# Patient Record
Sex: Male | Born: 1945
Health system: Southern US, Community
[De-identification: ages and names within clinical notes are randomized; demographics above are authoritative.]

## PROBLEM LIST (undated history)

## (undated) DIAGNOSIS — D649 Anemia, unspecified: Secondary | ICD-10-CM

## (undated) DIAGNOSIS — M1990 Unspecified osteoarthritis, unspecified site: Secondary | ICD-10-CM

## (undated) DIAGNOSIS — L89159 Pressure ulcer of sacral region, unspecified stage: Secondary | ICD-10-CM

## (undated) DIAGNOSIS — G8220 Paraplegia, unspecified: Secondary | ICD-10-CM

## (undated) HISTORY — DX: Anemia, unspecified: D64.9

## (undated) HISTORY — DX: Unspecified osteoarthritis, unspecified site: M19.90

## (undated) HISTORY — DX: Paraplegia, unspecified: G82.20

## (undated) HISTORY — PX: TONSILLECTOMY: SUR1361

---

## 2004-10-03 ENCOUNTER — Emergency Department: Payer: Self-pay | Admitting: Emergency Medicine

## 2008-09-08 ENCOUNTER — Emergency Department: Payer: Self-pay | Admitting: Emergency Medicine

## 2008-09-08 ENCOUNTER — Inpatient Hospital Stay: Payer: Self-pay | Admitting: Internal Medicine

## 2008-11-03 ENCOUNTER — Emergency Department: Payer: Self-pay

## 2009-01-05 ENCOUNTER — Emergency Department: Payer: Self-pay | Admitting: Emergency Medicine

## 2009-09-20 ENCOUNTER — Emergency Department: Payer: Self-pay | Admitting: Emergency Medicine

## 2011-06-17 ENCOUNTER — Emergency Department: Payer: Self-pay | Admitting: Unknown Physician Specialty

## 2011-06-17 LAB — COMPREHENSIVE METABOLIC PANEL
Albumin: 3.6 g/dL (ref 3.4–5.0)
Alkaline Phosphatase: 74 U/L (ref 50–136)
Anion Gap: 7 (ref 7–16)
Calcium, Total: 8.7 mg/dL (ref 8.5–10.1)
Co2: 31 mmol/L (ref 21–32)
Creatinine: 0.61 mg/dL (ref 0.60–1.30)
EGFR (Non-African Amer.): >60
Glucose: 73 mg/dL (ref 65–99)
Osmolality: 283 (ref 275–301)
SGPT (ALT): 19 U/L
Sodium: 143 mmol/L (ref 136–145)

## 2011-06-17 LAB — CBC
HCT: 39.6 % — ABNORMAL LOW (ref 40.0–52.0)
MCH: 25.7 pg — ABNORMAL LOW (ref 26.0–34.0)
MCV: 79 fL — ABNORMAL LOW (ref 80–100)
RBC: 4.99 10*6/uL (ref 4.40–5.90)
RDW: 15.5 % — ABNORMAL HIGH (ref 11.5–14.5)
WBC: 7.6 10*3/uL (ref 3.8–10.6)

## 2011-06-17 LAB — MAGNESIUM: Magnesium: 2.3 mg/dL

## 2011-08-15 ENCOUNTER — Encounter: Payer: Self-pay | Admitting: Nurse Practitioner

## 2011-08-15 ENCOUNTER — Encounter: Payer: Self-pay | Admitting: Cardiothoracic Surgery

## 2011-09-05 ENCOUNTER — Encounter: Payer: Self-pay | Admitting: Cardiothoracic Surgery

## 2011-09-05 ENCOUNTER — Encounter: Payer: Self-pay | Admitting: Nurse Practitioner

## 2011-10-05 ENCOUNTER — Encounter: Payer: Self-pay | Admitting: Nurse Practitioner

## 2011-10-05 ENCOUNTER — Encounter: Payer: Self-pay | Admitting: Cardiothoracic Surgery

## 2011-11-05 ENCOUNTER — Encounter: Payer: Self-pay | Admitting: Cardiothoracic Surgery

## 2011-11-05 ENCOUNTER — Encounter: Payer: Self-pay | Admitting: Nurse Practitioner

## 2011-12-05 ENCOUNTER — Encounter: Payer: Self-pay | Admitting: Nurse Practitioner

## 2011-12-05 ENCOUNTER — Encounter: Payer: Self-pay | Admitting: Cardiothoracic Surgery

## 2012-01-05 ENCOUNTER — Encounter: Payer: Self-pay | Admitting: Cardiothoracic Surgery

## 2012-01-05 ENCOUNTER — Encounter: Payer: Self-pay | Admitting: Nurse Practitioner

## 2013-04-10 ENCOUNTER — Encounter: Payer: Self-pay | Admitting: Surgery

## 2013-05-06 ENCOUNTER — Encounter: Payer: Self-pay | Admitting: Surgery

## 2013-06-06 ENCOUNTER — Encounter: Payer: Self-pay | Admitting: Surgery

## 2013-06-24 ENCOUNTER — Emergency Department: Payer: Self-pay | Admitting: Emergency Medicine

## 2013-06-24 LAB — COMPREHENSIVE METABOLIC PANEL
ANION GAP: 4 — AB (ref 7–16)
AST: 29 U/L (ref 15–37)
Albumin: 3.3 g/dL — ABNORMAL LOW (ref 3.4–5.0)
Alkaline Phosphatase: 90 U/L
BUN: 21 mg/dL — AB (ref 7–18)
Bilirubin,Total: 0.3 mg/dL (ref 0.2–1.0)
Calcium, Total: 8.9 mg/dL (ref 8.5–10.1)
Chloride: 105 mmol/L (ref 98–107)
Co2: 27 mmol/L (ref 21–32)
Creatinine: 0.72 mg/dL (ref 0.60–1.30)
GLUCOSE: 92 mg/dL (ref 65–99)
Osmolality: 275 (ref 275–301)
Potassium: 4.9 mmol/L (ref 3.5–5.1)
SGPT (ALT): 16 U/L (ref 12–78)
Sodium: 136 mmol/L (ref 136–145)
Total Protein: 7.3 g/dL (ref 6.4–8.2)

## 2013-06-24 LAB — CBC WITH DIFFERENTIAL/PLATELET
BASOS ABS: 0.1 10*3/uL (ref 0.0–0.1)
BASOS PCT: 1.1 %
EOS ABS: 0.3 10*3/uL (ref 0.0–0.7)
Eosinophil %: 3.4 %
HCT: 36.9 % — AB (ref 40.0–52.0)
HGB: 11.8 g/dL — AB (ref 13.0–18.0)
Lymphocyte #: 2.8 10*3/uL (ref 1.0–3.6)
Lymphocyte %: 31.2 %
MCH: 24 pg — ABNORMAL LOW (ref 26.0–34.0)
MCHC: 32 g/dL (ref 32.0–36.0)
MCV: 75 fL — ABNORMAL LOW (ref 80–100)
Monocyte #: 0.8 x10 3/mm (ref 0.2–1.0)
Monocyte %: 8.3 %
Neutrophil #: 5.1 10*3/uL (ref 1.4–6.5)
Neutrophil %: 56 %
Platelet: 273 10*3/uL (ref 150–440)
RBC: 4.92 10*6/uL (ref 4.40–5.90)
RDW: 16 % — AB (ref 11.5–14.5)
WBC: 9.1 10*3/uL (ref 3.8–10.6)

## 2013-06-26 LAB — CBC WITH DIFFERENTIAL/PLATELET
Basophil #: 0 10*3/uL (ref 0.0–0.1)
Basophil %: 0.3 %
EOS ABS: 0.5 10*3/uL (ref 0.0–0.7)
Eosinophil %: 5.8 %
HCT: 30.9 % — ABNORMAL LOW (ref 40.0–52.0)
HGB: 9.8 g/dL — AB (ref 13.0–18.0)
Lymphocyte #: 2.2 10*3/uL (ref 1.0–3.6)
Lymphocyte %: 27 %
MCH: 23.7 pg — ABNORMAL LOW (ref 26.0–34.0)
MCHC: 31.8 g/dL — AB (ref 32.0–36.0)
MCV: 75 fL — ABNORMAL LOW (ref 80–100)
MONOS PCT: 8.1 %
Monocyte #: 0.7 x10 3/mm (ref 0.2–1.0)
NEUTROS ABS: 4.9 10*3/uL (ref 1.4–6.5)
Neutrophil %: 58.8 %
Platelet: 247 10*3/uL (ref 150–440)
RBC: 4.14 10*6/uL — AB (ref 4.40–5.90)
RDW: 15.9 % — AB (ref 11.5–14.5)
WBC: 8.3 10*3/uL (ref 3.8–10.6)

## 2013-06-27 ENCOUNTER — Inpatient Hospital Stay: Payer: Self-pay | Admitting: Internal Medicine

## 2013-06-27 LAB — HEMOGLOBIN
HGB: 10.1 g/dL — ABNORMAL LOW (ref 13.0–18.0)
HGB: 9.2 g/dL — AB (ref 13.0–18.0)
HGB: 9 g/dL — ABNORMAL LOW (ref 13.0–18.0)

## 2013-06-27 LAB — HEMATOCRIT
HCT: 31.6 % — ABNORMAL LOW (ref 40.0–52.0)
HCT: 29.1 % — ABNORMAL LOW (ref 40.0–52.0)
HCT: 28.2 % — AB (ref 40.0–52.0)

## 2013-06-27 LAB — COMPREHENSIVE METABOLIC PANEL
ALBUMIN: 3.2 g/dL — AB (ref 3.4–5.0)
Alkaline Phosphatase: 76 U/L
Anion Gap: 7 (ref 7–16)
BUN: 14 mg/dL (ref 7–18)
Bilirubin,Total: 0.3 mg/dL (ref 0.2–1.0)
CREATININE: 0.81 mg/dL (ref 0.60–1.30)
Calcium, Total: 8.4 mg/dL — ABNORMAL LOW (ref 8.5–10.1)
Chloride: 102 mmol/L (ref 98–107)
Co2: 30 mmol/L (ref 21–32)
EGFR (Non-African Amer.): >60
Glucose: 147 mg/dL — ABNORMAL HIGH (ref 65–99)
OSMOLALITY: 281 (ref 275–301)
POTASSIUM: 3.5 mmol/L (ref 3.5–5.1)
SGOT(AST): 13 U/L — ABNORMAL LOW (ref 15–37)
SGPT (ALT): 15 U/L (ref 12–78)
Sodium: 139 mmol/L (ref 136–145)
Total Protein: 6.9 g/dL (ref 6.4–8.2)

## 2013-06-27 LAB — TROPONIN I: Troponin-I: <0.02 ng/mL

## 2013-06-28 LAB — CBC WITH DIFFERENTIAL/PLATELET
Basophil #: 0 10*3/uL (ref 0.0–0.1)
Basophil %: 0.3 %
Eosinophil #: 0.4 10*3/uL (ref 0.0–0.7)
Eosinophil %: 4.8 %
HCT: 29.2 % — AB (ref 40.0–52.0)
HGB: 9.3 g/dL — ABNORMAL LOW (ref 13.0–18.0)
Lymphocyte #: 1.6 10*3/uL (ref 1.0–3.6)
Lymphocyte %: 19.6 %
MCH: 24.1 pg — ABNORMAL LOW (ref 26.0–34.0)
MCHC: 31.9 g/dL — ABNORMAL LOW (ref 32.0–36.0)
MCV: 75 fL — ABNORMAL LOW (ref 80–100)
MONO ABS: 0.7 x10 3/mm (ref 0.2–1.0)
Monocyte %: 8.9 %
NEUTROS ABS: 5.3 10*3/uL (ref 1.4–6.5)
NEUTROS PCT: 66.4 %
PLATELETS: 255 10*3/uL (ref 150–440)
RBC: 3.88 10*6/uL — ABNORMAL LOW (ref 4.40–5.90)
RDW: 16.3 % — ABNORMAL HIGH (ref 11.5–14.5)
WBC: 8 10*3/uL (ref 3.8–10.6)

## 2013-06-28 LAB — BASIC METABOLIC PANEL
Anion Gap: 2 — ABNORMAL LOW (ref 7–16)
BUN: 6 mg/dL — ABNORMAL LOW (ref 7–18)
CHLORIDE: 109 mmol/L — AB (ref 98–107)
CO2: 32 mmol/L (ref 21–32)
CREATININE: 0.72 mg/dL (ref 0.60–1.30)
Calcium, Total: 8.2 mg/dL — ABNORMAL LOW (ref 8.5–10.1)
EGFR (African American): >60
EGFR (Non-African Amer.): >60
GLUCOSE: 101 mg/dL — AB (ref 65–99)
Osmolality: 283 (ref 275–301)
Potassium: 3.8 mmol/L (ref 3.5–5.1)
SODIUM: 143 mmol/L (ref 136–145)

## 2013-07-01 ENCOUNTER — Ambulatory Visit: Payer: Self-pay | Admitting: Gastroenterology

## 2013-07-07 ENCOUNTER — Encounter: Payer: Self-pay | Admitting: Surgery

## 2013-08-09 ENCOUNTER — Encounter: Payer: Self-pay | Admitting: Surgery

## 2013-08-20 ENCOUNTER — Ambulatory Visit: Payer: Self-pay | Admitting: Surgery

## 2013-08-20 LAB — BASIC METABOLIC PANEL
Anion Gap: 0 — ABNORMAL LOW (ref 7–16)
BUN: 12 mg/dL (ref 7–18)
Calcium, Total: 9.2 mg/dL (ref 8.5–10.1)
Chloride: 108 mmol/L — ABNORMAL HIGH (ref 98–107)
Co2: 31 mmol/L (ref 21–32)
Creatinine: 0.77 mg/dL (ref 0.60–1.30)
EGFR (African American): >60
Glucose: 90 mg/dL (ref 65–99)
OSMOLALITY: 277 (ref 275–301)
Potassium: 4.4 mmol/L (ref 3.5–5.1)
Sodium: 139 mmol/L (ref 136–145)

## 2013-08-20 LAB — CBC
HCT: 36.8 % — ABNORMAL LOW (ref 40.0–52.0)
HGB: 11.6 g/dL — ABNORMAL LOW (ref 13.0–18.0)
MCH: 23 pg — ABNORMAL LOW (ref 26.0–34.0)
MCHC: 31.6 g/dL — ABNORMAL LOW (ref 32.0–36.0)
MCV: 73 fL — ABNORMAL LOW (ref 80–100)
PLATELETS: 273 10*3/uL (ref 150–440)
RBC: 5.04 10*6/uL (ref 4.40–5.90)
RDW: 16.4 % — AB (ref 11.5–14.5)
WBC: 6.6 10*3/uL (ref 3.8–10.6)

## 2013-08-30 ENCOUNTER — Ambulatory Visit: Payer: Self-pay | Admitting: Surgery

## 2013-09-02 LAB — PATHOLOGY REPORT

## 2013-09-04 ENCOUNTER — Encounter: Payer: Self-pay | Admitting: Surgery

## 2014-04-19 ENCOUNTER — Inpatient Hospital Stay: Payer: Self-pay | Admitting: Family Medicine

## 2014-04-19 LAB — URINALYSIS, COMPLETE
BILIRUBIN, UR: NEGATIVE
Bacteria: NONE SEEN
GLUCOSE, UR: NEGATIVE mg/dL (ref 0–75)
Nitrite: NEGATIVE
Ph: 5 (ref 4.5–8.0)
Protein: NEGATIVE
RBC,UR: 3 /HPF (ref 0–5)
Specific Gravity: 1.018 (ref 1.003–1.030)
Squamous Epithelial: 4
WBC UR: 35 /HPF (ref 0–5)

## 2014-04-19 LAB — COMPREHENSIVE METABOLIC PANEL
ALK PHOS: 64 U/L
ALT: 18 U/L
AST: 26 U/L (ref 15–37)
Albumin: 2.5 g/dL — ABNORMAL LOW (ref 3.4–5.0)
Anion Gap: 8 (ref 7–16)
BUN: 12 mg/dL (ref 7–18)
Bilirubin,Total: 0.4 mg/dL (ref 0.2–1.0)
CHLORIDE: 103 mmol/L (ref 98–107)
CO2: 27 mmol/L (ref 21–32)
CREATININE: 0.83 mg/dL (ref 0.60–1.30)
Calcium, Total: 7.9 mg/dL — ABNORMAL LOW (ref 8.5–10.1)
GLUCOSE: 81 mg/dL (ref 65–99)
OSMOLALITY: 274 (ref 275–301)
Potassium: 4 mmol/L (ref 3.5–5.1)
Sodium: 138 mmol/L (ref 136–145)
TOTAL PROTEIN: 7.5 g/dL (ref 6.4–8.2)

## 2014-04-19 LAB — CBC WITH DIFFERENTIAL/PLATELET
BASOS PCT: 0.3 %
Basophil #: 0 10*3/uL (ref 0.0–0.1)
EOS PCT: 0.2 %
Eosinophil #: 0 10*3/uL (ref 0.0–0.7)
HCT: 31.3 % — ABNORMAL LOW (ref 40.0–52.0)
HGB: 9.6 g/dL — ABNORMAL LOW (ref 13.0–18.0)
Lymphocyte #: 1.1 10*3/uL (ref 1.0–3.6)
Lymphocyte %: 9.5 %
MCH: 21.7 pg — AB (ref 26.0–34.0)
MCHC: 30.6 g/dL — ABNORMAL LOW (ref 32.0–36.0)
MCV: 71 fL — AB (ref 80–100)
MONO ABS: 0.9 x10 3/mm (ref 0.2–1.0)
MONOS PCT: 8.4 %
NEUTROS ABS: 9.1 10*3/uL — AB (ref 1.4–6.5)
Neutrophil %: 81.6 %
PLATELETS: 547 10*3/uL — AB (ref 150–440)
RBC: 4.42 10*6/uL (ref 4.40–5.90)
RDW: 15.5 % — ABNORMAL HIGH (ref 11.5–14.5)
WBC: 11.2 10*3/uL — AB (ref 3.8–10.6)

## 2014-04-20 LAB — CBC WITH DIFFERENTIAL/PLATELET
BASOS ABS: 0 10*3/uL (ref 0.0–0.1)
Basophil %: 0.3 %
EOS PCT: 1.1 %
Eosinophil #: 0.1 10*3/uL (ref 0.0–0.7)
HCT: 26.2 % — ABNORMAL LOW (ref 40.0–52.0)
HGB: 8.2 g/dL — ABNORMAL LOW (ref 13.0–18.0)
Lymphocyte #: 2.1 10*3/uL (ref 1.0–3.6)
Lymphocyte %: 22.5 %
MCH: 22 pg — AB (ref 26.0–34.0)
MCHC: 31.5 g/dL — ABNORMAL LOW (ref 32.0–36.0)
MCV: 70 fL — AB (ref 80–100)
Monocyte #: 1.2 x10 3/mm — ABNORMAL HIGH (ref 0.2–1.0)
Monocyte %: 12.6 %
NEUTROS PCT: 63.5 %
Neutrophil #: 5.9 10*3/uL (ref 1.4–6.5)
Platelet: 451 10*3/uL — ABNORMAL HIGH (ref 150–440)
RBC: 3.74 10*6/uL — ABNORMAL LOW (ref 4.40–5.90)
RDW: 15.8 % — AB (ref 11.5–14.5)
WBC: 9.3 10*3/uL (ref 3.8–10.6)

## 2014-04-20 LAB — BASIC METABOLIC PANEL
Anion Gap: 5 — ABNORMAL LOW (ref 7–16)
BUN: 10 mg/dL (ref 7–18)
Calcium, Total: 7.7 mg/dL — ABNORMAL LOW (ref 8.5–10.1)
Chloride: 111 mmol/L — ABNORMAL HIGH (ref 98–107)
Co2: 28 mmol/L (ref 21–32)
Creatinine: 0.76 mg/dL (ref 0.60–1.30)
EGFR (African American): >60
EGFR (Non-African Amer.): >60
Glucose: 112 mg/dL — ABNORMAL HIGH (ref 65–99)
Osmolality: 287 (ref 275–301)
Potassium: 3.5 mmol/L (ref 3.5–5.1)
Sodium: 144 mmol/L (ref 136–145)

## 2014-04-21 LAB — CBC WITH DIFFERENTIAL/PLATELET
BASOS PCT: 0.5 %
Basophil #: 0 10*3/uL (ref 0.0–0.1)
EOS ABS: 0.2 10*3/uL (ref 0.0–0.7)
Eosinophil %: 2.8 %
HCT: 27.2 % — ABNORMAL LOW (ref 40.0–52.0)
HGB: 8.7 g/dL — ABNORMAL LOW (ref 13.0–18.0)
LYMPHS ABS: 1.5 10*3/uL (ref 1.0–3.6)
Lymphocyte %: 23 %
MCH: 22.2 pg — ABNORMAL LOW (ref 26.0–34.0)
MCHC: 32 g/dL (ref 32.0–36.0)
MCV: 70 fL — AB (ref 80–100)
Monocyte #: 0.4 x10 3/mm (ref 0.2–1.0)
Monocyte %: 6.1 %
NEUTROS ABS: 4.5 10*3/uL (ref 1.4–6.5)
NEUTROS PCT: 67.6 %
PLATELETS: 429 10*3/uL (ref 150–440)
RBC: 3.91 10*6/uL — AB (ref 4.40–5.90)
RDW: 15.9 % — AB (ref 11.5–14.5)
WBC: 6.7 10*3/uL (ref 3.8–10.6)

## 2014-04-21 LAB — BASIC METABOLIC PANEL
ANION GAP: 6 — AB (ref 7–16)
BUN: 8 mg/dL (ref 7–18)
Calcium, Total: 7.6 mg/dL — ABNORMAL LOW (ref 8.5–10.1)
Chloride: 110 mmol/L — ABNORMAL HIGH (ref 98–107)
Co2: 28 mmol/L (ref 21–32)
Creatinine: 0.75 mg/dL (ref 0.60–1.30)
EGFR (African American): >60
GLUCOSE: 88 mg/dL (ref 65–99)
Osmolality: 285 (ref 275–301)
Potassium: 3.4 mmol/L — ABNORMAL LOW (ref 3.5–5.1)
SODIUM: 144 mmol/L (ref 136–145)

## 2014-04-21 LAB — VANCOMYCIN, TROUGH
Vancomycin, Trough: 16 ug/mL (ref 10–20)
Vancomycin, Trough: 19 ug/mL (ref 10–20)

## 2014-04-22 LAB — BASIC METABOLIC PANEL
ANION GAP: 6 — AB (ref 7–16)
BUN: 7 mg/dL (ref 7–18)
CHLORIDE: 109 mmol/L — AB (ref 98–107)
Calcium, Total: 7.3 mg/dL — ABNORMAL LOW (ref 8.5–10.1)
Co2: 29 mmol/L (ref 21–32)
Creatinine: 0.85 mg/dL (ref 0.60–1.30)
EGFR (African American): >60
GLUCOSE: 85 mg/dL (ref 65–99)
OSMOLALITY: 284 (ref 275–301)
Potassium: 3.5 mmol/L (ref 3.5–5.1)
Sodium: 144 mmol/L (ref 136–145)

## 2014-04-22 LAB — CBC WITH DIFFERENTIAL/PLATELET
BASOS PCT: 0.5 %
Basophil #: 0 10*3/uL (ref 0.0–0.1)
EOS ABS: 0.3 10*3/uL (ref 0.0–0.7)
EOS PCT: 4.9 %
HCT: 28.6 % — AB (ref 40.0–52.0)
HGB: 9.1 g/dL — ABNORMAL LOW (ref 13.0–18.0)
LYMPHS ABS: 1.3 10*3/uL (ref 1.0–3.6)
LYMPHS PCT: 23.5 %
MCH: 22 pg — ABNORMAL LOW (ref 26.0–34.0)
MCHC: 31.7 g/dL — AB (ref 32.0–36.0)
MCV: 69 fL — AB (ref 80–100)
MONO ABS: 0.4 x10 3/mm (ref 0.2–1.0)
MONOS PCT: 7.8 %
NEUTROS ABS: 3.4 10*3/uL (ref 1.4–6.5)
Neutrophil %: 63.3 %
Platelet: 445 10*3/uL — ABNORMAL HIGH (ref 150–440)
RBC: 4.12 10*6/uL — ABNORMAL LOW (ref 4.40–5.90)
RDW: 15.8 % — ABNORMAL HIGH (ref 11.5–14.5)
WBC: 5.4 10*3/uL (ref 3.8–10.6)

## 2014-04-22 LAB — URINE CULTURE

## 2014-04-22 LAB — VANCOMYCIN, TROUGH: Vancomycin, Trough: 18 ug/mL (ref 10–20)

## 2014-04-23 LAB — CBC WITH DIFFERENTIAL/PLATELET
BASOS ABS: 0 10*3/uL (ref 0.0–0.1)
Basophil %: 0.4 %
Eosinophil #: 0.3 10*3/uL (ref 0.0–0.7)
Eosinophil %: 6.2 %
HCT: 29.6 % — ABNORMAL LOW (ref 40.0–52.0)
HGB: 9.4 g/dL — ABNORMAL LOW (ref 13.0–18.0)
LYMPHS ABS: 1.3 10*3/uL (ref 1.0–3.6)
Lymphocyte %: 27 %
MCH: 22.1 pg — AB (ref 26.0–34.0)
MCHC: 31.6 g/dL — ABNORMAL LOW (ref 32.0–36.0)
MCV: 70 fL — AB (ref 80–100)
MONO ABS: 0.5 x10 3/mm (ref 0.2–1.0)
Monocyte %: 11.1 %
NEUTROS PCT: 55.3 %
Neutrophil #: 2.7 10*3/uL (ref 1.4–6.5)
Platelet: 421 10*3/uL (ref 150–440)
RBC: 4.24 10*6/uL — ABNORMAL LOW (ref 4.40–5.90)
RDW: 15.7 % — AB (ref 11.5–14.5)
WBC: 4.9 10*3/uL (ref 3.8–10.6)

## 2014-04-23 LAB — BASIC METABOLIC PANEL
Anion Gap: 6 — ABNORMAL LOW (ref 7–16)
BUN: 7 mg/dL (ref 7–18)
CALCIUM: 8.1 mg/dL — AB (ref 8.5–10.1)
CHLORIDE: 109 mmol/L — AB (ref 98–107)
CREATININE: 0.82 mg/dL (ref 0.60–1.30)
Co2: 27 mmol/L (ref 21–32)
EGFR (African American): >60
EGFR (Non-African Amer.): >60
GLUCOSE: 98 mg/dL (ref 65–99)
OSMOLALITY: 281 (ref 275–301)
POTASSIUM: 3.3 mmol/L — AB (ref 3.5–5.1)
SODIUM: 142 mmol/L (ref 136–145)

## 2014-04-24 LAB — WOUND CULTURE

## 2014-04-24 LAB — CULTURE, BLOOD (SINGLE)

## 2014-05-13 ENCOUNTER — Observation Stay: Payer: Self-pay | Admitting: Family Medicine

## 2014-05-13 LAB — CBC WITH DIFFERENTIAL/PLATELET
Basophil #: 0 10*3/uL (ref 0.0–0.1)
Basophil %: 0.3 %
Eosinophil #: 0.1 10*3/uL (ref 0.0–0.7)
Eosinophil %: 0.6 %
HCT: 30.4 % — ABNORMAL LOW (ref 40.0–52.0)
HGB: 9.3 g/dL — ABNORMAL LOW (ref 13.0–18.0)
Lymphocyte #: 1.9 10*3/uL (ref 1.0–3.6)
Lymphocyte %: 15.7 %
MCH: 21.4 pg — ABNORMAL LOW (ref 26.0–34.0)
MCHC: 30.7 g/dL — ABNORMAL LOW (ref 32.0–36.0)
MCV: 70 fL — ABNORMAL LOW (ref 80–100)
MONO ABS: 1.5 x10 3/mm — AB (ref 0.2–1.0)
Monocyte %: 12.1 %
NEUTROS PCT: 71.3 %
Neutrophil #: 8.7 10*3/uL — ABNORMAL HIGH (ref 1.4–6.5)
Platelet: 520 10*3/uL — ABNORMAL HIGH (ref 150–440)
RBC: 4.35 10*6/uL — ABNORMAL LOW (ref 4.40–5.90)
RDW: 16.8 % — ABNORMAL HIGH (ref 11.5–14.5)
WBC: 12.2 10*3/uL — AB (ref 3.8–10.6)

## 2014-05-13 LAB — COMPREHENSIVE METABOLIC PANEL
ALK PHOS: 71 U/L
ANION GAP: 8 (ref 7–16)
AST: 18 U/L (ref 15–37)
Albumin: 2.5 g/dL — ABNORMAL LOW (ref 3.4–5.0)
BUN: 23 mg/dL — ABNORMAL HIGH (ref 7–18)
Bilirubin,Total: 0.5 mg/dL (ref 0.2–1.0)
Calcium, Total: 8.7 mg/dL (ref 8.5–10.1)
Chloride: 99 mmol/L (ref 98–107)
Co2: 28 mmol/L (ref 21–32)
Creatinine: 1.05 mg/dL (ref 0.60–1.30)
EGFR (Non-African Amer.): >60
Glucose: 92 mg/dL (ref 65–99)
Osmolality: 273 (ref 275–301)
POTASSIUM: 3.8 mmol/L (ref 3.5–5.1)
SGPT (ALT): 13 U/L — ABNORMAL LOW
Sodium: 135 mmol/L — ABNORMAL LOW (ref 136–145)
TOTAL PROTEIN: 7.8 g/dL (ref 6.4–8.2)

## 2014-05-13 LAB — URINALYSIS, COMPLETE
BILIRUBIN, UR: NEGATIVE
Blood: NEGATIVE
GLUCOSE, UR: NEGATIVE mg/dL (ref 0–75)
Ketone: NEGATIVE
LEUKOCYTE ESTERASE: NEGATIVE
Nitrite: NEGATIVE
Ph: 5 (ref 4.5–8.0)
Protein: NEGATIVE
SPECIFIC GRAVITY: 1.011 (ref 1.003–1.030)
WBC UR: 4 /HPF (ref 0–5)

## 2014-05-13 LAB — PROTIME-INR
INR: 1.1
PROTHROMBIN TIME: 14.3 s (ref 11.5–14.7)

## 2014-05-13 LAB — TROPONIN I: Troponin-I: <0.02 ng/mL

## 2014-05-13 LAB — D-DIMER(ARMC): D-Dimer: 1136 ng/ml

## 2014-05-14 LAB — CBC WITH DIFFERENTIAL/PLATELET
Basophil #: 0 10*3/uL (ref 0.0–0.1)
Basophil %: 0.2 %
Eosinophil #: 0.1 10*3/uL (ref 0.0–0.7)
Eosinophil %: 0.9 %
HCT: 26.2 % — ABNORMAL LOW (ref 40.0–52.0)
HGB: 8.2 g/dL — AB (ref 13.0–18.0)
Lymphocyte #: 1.6 10*3/uL (ref 1.0–3.6)
Lymphocyte %: 16.3 %
MCH: 21.8 pg — AB (ref 26.0–34.0)
MCHC: 31.2 g/dL — AB (ref 32.0–36.0)
MCV: 70 fL — ABNORMAL LOW (ref 80–100)
MONOS PCT: 12.7 %
Monocyte #: 1.3 x10 3/mm — ABNORMAL HIGH (ref 0.2–1.0)
NEUTROS ABS: 7 10*3/uL — AB (ref 1.4–6.5)
Neutrophil %: 69.9 %
Platelet: 416 10*3/uL (ref 150–440)
RBC: 3.74 10*6/uL — ABNORMAL LOW (ref 4.40–5.90)
RDW: 16.9 % — ABNORMAL HIGH (ref 11.5–14.5)
WBC: 10 10*3/uL (ref 3.8–10.6)

## 2014-05-14 LAB — BASIC METABOLIC PANEL
Anion Gap: 9 (ref 7–16)
BUN: 20 mg/dL — AB (ref 7–18)
CHLORIDE: 103 mmol/L (ref 98–107)
CO2: 27 mmol/L (ref 21–32)
Calcium, Total: 8 mg/dL — ABNORMAL LOW (ref 8.5–10.1)
Creatinine: 1.1 mg/dL (ref 0.60–1.30)
Glucose: 92 mg/dL (ref 65–99)
OSMOLALITY: 280 (ref 275–301)
Potassium: 3.7 mmol/L (ref 3.5–5.1)
SODIUM: 139 mmol/L (ref 136–145)

## 2014-05-14 LAB — SEDIMENTATION RATE: Erythrocyte Sed Rate: 85 mm/hr — ABNORMAL HIGH (ref 0–20)

## 2014-05-15 LAB — CBC WITH DIFFERENTIAL/PLATELET
Basophil #: 0 10*3/uL (ref 0.0–0.1)
Basophil %: 0.3 %
Eosinophil #: 0.3 10*3/uL (ref 0.0–0.7)
Eosinophil %: 3.8 %
HCT: 25.1 % — ABNORMAL LOW (ref 40.0–52.0)
HGB: 7.9 g/dL — ABNORMAL LOW (ref 13.0–18.0)
LYMPHS PCT: 24.4 %
Lymphocyte #: 2 10*3/uL (ref 1.0–3.6)
MCH: 21.7 pg — ABNORMAL LOW (ref 26.0–34.0)
MCHC: 31.4 g/dL — ABNORMAL LOW (ref 32.0–36.0)
MCV: 69 fL — ABNORMAL LOW (ref 80–100)
MONOS PCT: 10.4 %
Monocyte #: 0.9 x10 3/mm (ref 0.2–1.0)
NEUTROS PCT: 61.1 %
Neutrophil #: 5.1 10*3/uL (ref 1.4–6.5)
PLATELETS: 387 10*3/uL (ref 150–440)
RBC: 3.64 10*6/uL — AB (ref 4.40–5.90)
RDW: 16.8 % — ABNORMAL HIGH (ref 11.5–14.5)
WBC: 8.3 10*3/uL (ref 3.8–10.6)

## 2014-05-15 LAB — BASIC METABOLIC PANEL
ANION GAP: 7 (ref 7–16)
BUN: 11 mg/dL (ref 7–18)
CO2: 26 mmol/L (ref 21–32)
Calcium, Total: 7.7 mg/dL — ABNORMAL LOW (ref 8.5–10.1)
Chloride: 109 mmol/L — ABNORMAL HIGH (ref 98–107)
Creatinine: 0.89 mg/dL (ref 0.60–1.30)
EGFR (Non-African Amer.): >60
GLUCOSE: 86 mg/dL (ref 65–99)
OSMOLALITY: 282 (ref 275–301)
Potassium: 3.1 mmol/L — ABNORMAL LOW (ref 3.5–5.1)
SODIUM: 142 mmol/L (ref 136–145)

## 2014-05-15 LAB — VANCOMYCIN, TROUGH: VANCOMYCIN, TROUGH: 16 ug/mL (ref 10–20)

## 2014-05-16 LAB — CBC WITH DIFFERENTIAL/PLATELET
BASOS PCT: 0.3 %
Basophil #: 0 10*3/uL (ref 0.0–0.1)
EOS ABS: 0.3 10*3/uL (ref 0.0–0.7)
Eosinophil %: 3.9 %
HCT: 26.4 % — ABNORMAL LOW (ref 40.0–52.0)
HGB: 8.2 g/dL — ABNORMAL LOW (ref 13.0–18.0)
LYMPHS ABS: 1.8 10*3/uL (ref 1.0–3.6)
Lymphocyte %: 25.7 %
MCH: 21.6 pg — ABNORMAL LOW (ref 26.0–34.0)
MCHC: 31 g/dL — AB (ref 32.0–36.0)
MCV: 70 fL — AB (ref 80–100)
Monocyte #: 0.5 x10 3/mm (ref 0.2–1.0)
Monocyte %: 7.5 %
Neutrophil #: 4.3 10*3/uL (ref 1.4–6.5)
Neutrophil %: 62.6 %
Platelet: 424 10*3/uL (ref 150–440)
RBC: 3.79 10*6/uL — AB (ref 4.40–5.90)
RDW: 16.9 % — ABNORMAL HIGH (ref 11.5–14.5)
WBC: 6.9 10*3/uL (ref 3.8–10.6)

## 2014-05-16 LAB — BASIC METABOLIC PANEL
ANION GAP: 9 (ref 7–16)
BUN: 8 mg/dL (ref 7–18)
CALCIUM: 7.9 mg/dL — AB (ref 8.5–10.1)
Chloride: 106 mmol/L (ref 98–107)
Co2: 27 mmol/L (ref 21–32)
Creatinine: 0.86 mg/dL (ref 0.60–1.30)
EGFR (African American): >60
EGFR (Non-African Amer.): >60
Glucose: 83 mg/dL (ref 65–99)
OSMOLALITY: 281 (ref 275–301)
Potassium: 3.1 mmol/L — ABNORMAL LOW (ref 3.5–5.1)
SODIUM: 142 mmol/L (ref 136–145)

## 2014-05-18 LAB — URINE CULTURE

## 2014-05-18 LAB — CULTURE, BLOOD (SINGLE)

## 2014-05-18 LAB — WOUND AEROBIC CULTURE

## 2014-06-06 HISTORY — PX: COLONOSCOPY: SHX174
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
## 2014-09-27 NOTE — Discharge Summary (Signed)
PATIENT NAME:  Bradley [name], Bradley [name] MR#:  [id number] DATE OF BIRTH:  [birth year]  DATE OF ADMISSION:  05/13/2014 DATE OF DISCHARGE:  05/16/2014  DISCHARGE DIAGNOSES: 1.  Sepsis, resolved.  2.  Urinary tract infection.  3.  Chronic sacral wound.  4.  Hypertension.  5.  Hypokalemia.  6.  Chronic anemia.   DISCHARGE MEDICATIONS: 1.  Avodart 0.5 mg p.o. daily.  2.  MiraLax 17 grams p.o. q. day p.r.n. for constipation.  3.  Lisinopril 5 mg p.o. daily.  4.  Acetaminophen 325 mg 2 tabs p.o. q. 4 hours as needed for pain and fever.  5.  Potassium chloride 10 mEq p.o. daily. 6.  Levofloxacin 250 mg p.o. daily x7 more days.   CONSULTATIONS: None.   PROCEDURES: None.   PERTINENT LABS AND STUDIES: On day of discharge, sodium 142, potassium 3.1, creatinine 0.86. Hemoglobin 8.2, white blood cell count 6.9 and platelets 424,000.   Blood culture showed no growth to date. Wound culture showed gram-positive cocci, many. Urine culture showed gram-negative rods, greater than 100,000.   BRIEF HOSPITAL COURSE:  1.  Sepsis. The patient initially came in with leukocytosis and tachycardia consistent with sepsis. Was placed on IV vanc and Zosyn. Remained afebrile during his hospital stay. His white blood cell count trended down. Now it looks that the urine likely caused this, the urinary tract infection, although his blood cultures have been negative. We will continue treatment with Levaquin for another 7 days.  2.  UTI. Plan is to treat with Levaquin. Urine culture shows gram-negative rods thus far. Species is to be determined with sensitivities.  3.  Sacral wound. This is a chronic issue. Being followed by wound care. Starting to improve. We would like for him to be in a skilled nursing facility so they can give proper wound care.  4.  Hypokalemia, acute. Will supplement with potassium chloride.  5.  Hypertension. Stable at this time. Continue on lisinopril. 6.  Chronic anemia, stable, asymptomatic at this time.  He is also paraplegic and has multiple contractures that needs physical therapy at this time.   DISPOSITION: He is in stable condition and will be discharged to a skilled nursing facility for further rehab. Follow up with Dr. Netty Starring upon discharge from the rehab.   ____________________________ [name], MD kl:sb D: 05/16/2014 08:34:45 ET T: 05/16/2014 10:45:05 ET JOB#: [id number]  cc: [name], MD, <Dictator> [name] MD ELECTRONICALLY SIGNED 05/27/2014 15:28
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
## 2014-09-27 NOTE — Discharge Summary (Signed)
PATIENT NAME:  Bradley [name], Bradley [name] MR#:  [id number] DATE OF BIRTH:  Oct 19, 1945  DATE OF ADMISSION:  04/19/2014 DATE OF DISCHARGE:  04/23/2014  DISCHARGE DIAGNOSES: 1.  Pyuria.   2.  Sacral decubitus wounds.  3.  Muscle contractures that are chronic.   DISCHARGE MEDICATIONS: 1.  Bactrim DS 1 tablet p.o. b.i.d. x 4 more days.  2.  Avodart 0.5 mg p.o. daily in the evening.  3.  MiraLax 17 grams p.o. daily as needed for constipation.  4.  Lisinopril 5 mg p.o. daily.   CONSULTATIONS: None.   PROCEDURES: None.   PERTINENT LABORATORY AND STUDIES: Urine culture showed 15,000 coag-negative staph. Wound culture also consistent with staph. White blood cell count prior to discharge was 5.4, hemoglobin 9.1, and platelets 445,000.   BRIEF HOSPITAL COURSE:  UTI. The patient initially came in with fever. A urinalysis consistent with a UTI, but after further evaluation it is more consistent with pyuria with 15,000 colonies of staph seen in the urine, also had staph on the wound culture. Was treated by would care while he was here. Do recommend further nursing care of the sacral decubitus wounds. Will treat the pyuria with 4 more days of Bactrim DS which will also help with the wound infection. His muscle contractures are chronic and that needs to be followed up by [facility] physical therapy. Otherwise, he is in stable condition to be discharged to home. He has family support at home.   DISPOSITION: He is in stable condition to be discharged. He will follow with Dr. Netty Starring in 10 days.    ____________________________ [name], MD kl:at D: 04/23/2014 08:56:35 ET T: 04/23/2014 13:01:54 ET JOB#: [id number]  cc: [name], MD, <Dictator> [name] MD ELECTRONICALLY SIGNED 05/05/2014 8:59

## 2014-09-27 NOTE — Consult Note (Signed)
Details:   - Hgb stable today.     Will plan for outpatient colon on Monday morning.    He will need to be on clears on Sat and Sunday.   Also, please start him on miralax 34 grams BID for today, saturday, sunday.   We will provide him prep instructions and call the prep into his pharmacy.   Electronic Signatures: Arther Dames (MD)  (Signed 23-Jan-15 10:42)  Authored: Details   Last Updated: 23-Jan-15 10:42 by Arther Dames (MD)
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
## 2014-09-27 NOTE — Consult Note (Signed)
GI note.  I have seen and examined Mr Popper and agree with Tobe Sos a/p.   Several episodes of rectal bleeding since Sat.  None since ED yesterday.  Hgb dropped slightly upon pres but now stable.   Plan:  I suspect he is going to be a very difficult colon prep. He will need 2 days of clear liquids and likely 1.5x  trilyte.  If Hgb stable tomorrow and no bleeding, will plan for d/c home with clear liquds on Sat and Sun, followed by colon prep Sun night and Monday morning for Monday morning colonoscopy as outpatient.   Electronic Signatures: Arther Dames (MD)  (Signed on 22-Jan-15 17:32)  Authored  Last Updated: 22-Jan-15 17:32 by Arther Dames (MD)

## 2014-12-17 ENCOUNTER — Encounter: Payer: Medicare Other | Attending: Surgery | Admitting: Surgery

## 2014-12-17 DIAGNOSIS — M069 Rheumatoid arthritis, unspecified: Secondary | ICD-10-CM | POA: Diagnosis not present

## 2014-12-17 DIAGNOSIS — L89313 Pressure ulcer of right buttock, stage 3: Secondary | ICD-10-CM | POA: Insufficient documentation

## 2014-12-17 DIAGNOSIS — G8220 Paraplegia, unspecified: Secondary | ICD-10-CM | POA: Insufficient documentation

## 2014-12-17 DIAGNOSIS — L89323 Pressure ulcer of left buttock, stage 3: Secondary | ICD-10-CM | POA: Insufficient documentation

## 2014-12-17 DIAGNOSIS — L89153 Pressure ulcer of sacral region, stage 3: Secondary | ICD-10-CM | POA: Diagnosis present

## 2014-12-17 DIAGNOSIS — Z87891 Personal history of nicotine dependence: Secondary | ICD-10-CM | POA: Insufficient documentation

## 2014-12-17 DIAGNOSIS — I10 Essential (primary) hypertension: Secondary | ICD-10-CM | POA: Diagnosis not present

## 2014-12-18 NOTE — Progress Notes (Signed)
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 Ear/Nose/Mouth/Throat Complaints and Symptoms: No Complaints or Symptoms Hematologic/Lymphatic Bradley [name], HENGEL ([id number]) Complaints and Symptoms: No Complaints or Symptoms Respiratory Complaints and Symptoms: No Complaints or Symptoms Cardiovascular Medical History: Positive for: Hypertension Gastrointestinal Complaints and Symptoms: No Complaints or Symptoms Endocrine Complaints and Symptoms: No Complaints or Symptoms Genitourinary Complaints and Symptoms: No Complaints or Symptoms Medical History: Past Medical History  Notes: frequent urination Musculoskeletal Medical History: Positive for: Rheumatoid Arthritis Neurologic Medical History: Positive for: Paraplegia Oncologic Complaints and Symptoms: No Complaints or Symptoms Medical History: Negative for: Received Chemotherapy; Received Radiation Hospitalization / Surgery History Name of Hospital Purpose of Hospitalization/Surgery Date Bradley [name], Bradley [name] ([id number]) [place] Hemorroids 06/29/2013 Family and Social History Cancer: Yes - Mother; Diabetes: No; Heart Disease: Yes -  Mother; Hereditary Spherocytosis: No; Hypertension: No; Kidney Disease: No; Lung Disease: No; Seizures: Yes - Child; Stroke: No; Thyroid Problems: No; Tuberculosis: No; Former smoker; Alcohol Use: Never; Drug Use: No History; Caffeine Use: Moderate - coffee; Financial Concerns: No; Food, Clothing or Shelter Needs: No; Support System Lacking: No; Transportation Concerns: No; Advanced Directives: No; Patient does not want information on Advanced Directives; Do not resuscitate: No; Living Will: No; Medical Power of Attorney: No Physician Affirmation I have reviewed and agree with the above information. Electronic Signature(s) Signed: 12/17/2014 3:34:44 PM By: Loletha Grayer MD Signed: 12/17/2014 5:29:58 PM By: Montey Hora Entered By: Loletha Grayer on 12/17/2014 [place] ([id number]) -------------------------------------------------------------------------------- SuperBill Details Patient Name: BERNIS, SCHREUR A. Date of Service: 12/17/2014 Medical Record Number: [id number] Patient Account Number: [id number] Date of Birth/Sex: 05-27-1946 (69 y.o. Male) Treating RN: Primary Care Physician: [name] Other Clinician: Referring Physician: [name] Treating Physician/Extender: BURNS III, Charlean Sanfilippo in Treatment: 0 Diagnosis Coding ICD-10 Codes Code Description L89.153 Pressure ulcer of sacral region, stage 3 L89.323 Pressure ulcer of  left buttock, stage 3 L89.313 Pressure ulcer of right buttock, stage 3 G82.20 Paraplegia, unspecified Facility Procedures CPT4 Code: 44818563 Description: [place] VISIT-LEV 3 EST PT Modifier: Quantity: 1 CPT4 Code: 14970263 Description: 11042 - DEB SUBQ TISSUE 20 SQ CM/< ICD-10 Description Diagnosis L89.153 Pressure ulcer of sacral region, stage 3 Modifier: Quantity: 1 CPT4 Code: 78588502 Description: 11045 - DEB SUBQ TISS EA ADDL 20CM ICD-10 Description Diagnosis L89.153 Pressure ulcer of sacral region, stage 3 Modifier: Quantity: 4 Physician Procedures CPT4 Code: 7741287 Description: 99214 - WC PHYS LEVEL 4 - EST PT ICD-10 Description Diagnosis L89.153 Pressure ulcer of sacral region, stage 3 Modifier: Quantity: 1 CPT4 Code: 8676720 Description: 11042 - WC PHYS SUBQ TISS 20 SQ CM ICD-10 Description Diagnosis L89.153 Pressure ulcer of sacral region, stage 3 Modifier: Quantity: 1 CPT4 Code: 9470962 Bradley Loa A Description: 11045 - WC PHYS SUBQ TISS EA ADDL 20 CM . ([id number]) Modifier: Quantity: 4 Electronic Signature(s) Signed: 12/17/2014 4:15:09 PM By: Montey Hora Previous Signature: 12/17/2014 3:34:44 PM Version By: Loletha Grayer MD Entered By: Montey Hora on 12/17/2014 16:15:09

## 2014-12-18 NOTE — Progress Notes (Signed)
BRADLEY, BOSTELMAN ([id number]) Visit Report for 12/17/2014 Allergy List Details Patient Name: Bradley [name], Bradley [name]. Date of Service: 12/17/2014 1:00 PM Medical Record Number: [id number] Patient Account Number: [id number] Date of Birth/Sex: 1946-04-15 (69 y.o. Male) Treating RN: Montey Hora Primary Care Physician: [name] Other Clinician: Referring Physician: [name] Treating Physician/Extender: BURNS III, WALTER Weeks in Treatment: 0 Allergies Active Allergies No Known Allergies Allergy Notes Electronic Signature(s) Signed: 12/17/2014 5:29:58 PM By: Montey Hora Entered By: Montey Hora on 12/17/2014 13:51:17 [name] ([id number]) -------------------------------------------------------------------------------- Arrival Information Details Patient Name: Bradley Loa A. Date of Service: 12/17/2014 1:00 PM Medical Record Number: [id number] Patient Account Number: [id number] Date of Birth/Sex: [birth year] (69 y.o. Male) Treating RN: Montey Hora Primary Care Physician: [name] Other Clinician: Referring Physician: [name] Treating Physician/Extender: BURNS III, Charlean Sanfilippo in Treatment: 0 Visit Information Patient Arrived: Wheel Chair Arrival Time: 13:42 Accompanied By: self Transfer Assistance: None Patient Identification Verified: Yes Secondary Verification Process Yes Completed: History Since Last Visit Added or deleted any medications: No Any new allergies or adverse reactions: No Had a fall or experienced change in activities of daily living that may affect risk of falls: No Signs or symptoms of abuse/neglect since last visito No Hospitalized since last visit: No Electronic Signature(s) Signed: 12/17/2014 5:29:58 PM By: Montey Hora Entered By: Montey Hora on 12/17/2014 13:42:52 [name] ([id number]) -------------------------------------------------------------------------------- Clinic Level of Care  Assessment Details Patient Name: Bradley Loa A. Date of Service: 12/17/2014 1:00 PM Medical Record Number: [id number] Patient Account Number: [id number] Date of Birth/Sex: [birth year] (69 y.o. Male) Treating RN: Montey Hora Primary Care Physician: [name] Other Clinician: Referring Physician: [name] Treating Physician/Extender: BURNS III, Charlean Sanfilippo in Treatment: 0 Clinic Level of Care Assessment Items TOOL 1 Quantity Score []  - Use when EandM and Procedure is performed on INITIAL visit 0 ASSESSMENTS - Nursing Assessment / Reassessment X - General Physical Exam (combine w/ comprehensive assessment (listed just 1 20 below) when performed on new pt. evals) X - Comprehensive Assessment (HX, ROS, Risk Assessments, Wounds Hx, etc.) 1 25 ASSESSMENTS - Wound and Skin Assessment / Reassessment []  - Dermatologic / Skin Assessment (not related to wound area) 0 ASSESSMENTS - Ostomy and/or Continence Assessment and Care []  - Incontinence Assessment and Management 0 []  - Ostomy Care Assessment and Management (repouching, etc.) 0 PROCESS - Coordination of Care X - Simple Patient / Family Education for ongoing care 1 15 []  - Complex (extensive) Patient / Family Education for ongoing care 0 X - Staff obtains Programmer, systems, Records, Test Results / Process Orders 1 10 []  - Staff telephones HHA, Nursing Homes / Clarify orders / etc 0 []  - Routine Transfer to another Facility (non-emergent condition) 0 []  - Routine Hospital Admission (non-emergent condition) 0 X - New Admissions / Biomedical engineer / Ordering NPWT, Apligraf, etc. 1 15 []  - Emergency Hospital Admission (emergent condition) 0 PROCESS - Special Needs []  - Pediatric / Minor Patient Management 0 []  - Isolation Patient Management 0 DAELEN, BELVEDERE A. ([id number]) []  - Hearing / Language / Visual special needs 0 []  - Assessment of Community assistance (transportation, D/C planning, etc.) 0 []  - Additional  assistance / Altered mentation 0 []  - Support Surface(s) Assessment (bed, cushion, seat, etc.) 0 INTERVENTIONS - Miscellaneous []  - External ear exam 0 []  - Patient Transfer (multiple staff / Civil Service fast streamer / Similar devices) 0 []  - Simple Staple / Suture removal (25 or less) 0 []  - Complex Staple / Suture removal (26 or  more) 0 []  - Hypo/Hyperglycemic Management (do not check if billed separately) 0 []  - Ankle / Brachial Index (ABI) - do not check if billed separately 0 Has the patient been seen at the hospital within the last three years: Yes Total Score: 85 Level Of Care: New/Established - Level 3 Electronic Signature(s) Signed: 12/17/2014 4:14:55 PM By: Montey Hora Entered By: Montey Hora on 12/17/2014 16:14:54 [name] ([id number]) -------------------------------------------------------------------------------- Encounter Discharge Information Details Patient Name: Bradley Loa A. Date of Service: 12/17/2014 1:00 PM Medical Record Number: [id number] Patient Account Number: [id number] Date of Birth/Sex: [birth year] (69 y.o. Male) Treating RN: Montey Hora Primary Care Physician: [name] Other Clinician: Referring Physician: [name] Treating Physician/Extender: BURNS III, Charlean Sanfilippo in Treatment: 0 Encounter Discharge Information Items Discharge Pain Level: 0 Discharge Condition: Stable Ambulatory Status: Wheelchair Discharge Destination: Home Private Transportation: Auto Accompanied By: self Schedule Follow-up Appointment: Yes Medication Reconciliation completed and No provided to Patient/Care Jaivyn [name]: Clinical Summary of Care: Electronic Signature(s) Signed: 12/17/2014 5:29:58 PM By: Montey Hora Entered By: Montey Hora on 12/17/2014 14:29:58 [name] ([id number]) -------------------------------------------------------------------------------- Multi Wound Chart Details Patient Name: Bradley Loa A. Date of Service:  12/17/2014 1:00 PM Medical Record Number: [id number] Patient Account Number: [id number] Date of Birth/Sex: November 07, 1945 (69 y.o. Male) Treating RN: Montey Hora Primary Care Physician: [name] Other Clinician: Referring Physician: [name] Treating Physician/Extender: BURNS III, Charlean Sanfilippo in Treatment: 0 Vital Signs Height(in): 73 Pulse(bpm): 75 Weight(lbs): 205 Blood Pressure 129/55 (mmHg): Body Mass Index(BMI): 27 Temperature(F): 97.9 Respiratory Rate 16 (breaths/min): Photos: [3:No Photos] [4:No Photos] [N/A:N/A] Wound Location: [3:Sacrum - Medial] [4:Scrotum - Medial, Posterior] [N/A:N/A] Wounding Event: [3:Gradually Appeared] [4:Gradually Appeared] [N/A:N/A] Primary Etiology: [3:Pressure Ulcer] [4:Pressure Ulcer] [N/A:N/A] Comorbid History: [3:Hypertension, Rheumatoid Arthritis, Paraplegia] [4:Hypertension, Rheumatoid Arthritis, Paraplegia] [N/A:N/A] Date Acquired: [3:07/07/2014] [4:07/07/2014] [N/A:N/A] Weeks of Treatment: [3:0] [4:0] [N/A:N/A] Wound Status: [3:Open] [4:Open] [N/A:N/A] Measurements L x W x D 11.5x7.3x0.3 [4:1.3x1x0.1] [N/A:N/A] (cm) Area (cm) : [3:65.934] [4:1.021] [N/A:N/A] Volume (cm) : [3:19.78] [4:0.102] [N/A:N/A] % Reduction in Area: [3:0.00%] [4:0.00%] [N/A:N/A] % Reduction in Volume: 0.00% [4:0.00%] [N/A:N/A] Classification: [3:Category/Stage III] [4:Category/Stage II] [N/A:N/A] Exudate Amount: [3:Medium] [4:Small] [N/A:N/A] Exudate Type: [3:Serous] [4:Serous] [N/A:N/A] Exudate Color: [3:amber] [4:amber] [N/A:N/A] Wound Margin: [3:Flat and Intact] [4:Flat and Intact] [N/A:N/A] Granulation Amount: [3:Medium (34-66%)] [4:Large (67-100%)] [N/A:N/A] Granulation Quality: [3:Red] [4:Red] [N/A:N/A] Necrotic Amount: [3:Medium (34-66%)] [4:N/A] [N/A:N/A] Exposed Structures: [3:Fascia: No Fat: No Tendon: No Muscle: No Joint: No] [4:Fascia: No Fat: No Tendon: No Muscle: No Joint: No] [N/A:N/A] Bone: No Bone: No Limited to Skin  Limited to Skin Breakdown Breakdown Epithelialization: Medium (34-66%) None N/A Debridement: Debridement (53299- N/A N/A 11047) Time-Out Taken: Yes N/A N/A Pain Control: Lidocaine 4% Topical N/A N/A Solution Tissue Debrided: Fibrin/Slough, Fat, Callus, N/A N/A Subcutaneous Level: Skin/Subcutaneous N/A N/A Tissue Debridement Area (sq 83.95 N/A N/A cm): Instrument: Curette N/A N/A Bleeding: Minimum N/A N/A Hemostasis Achieved: Pressure N/A N/A Procedural Pain: 0 N/A N/A Post Procedural Pain: 0 N/A N/A Debridement Treatment Procedure was tolerated N/A N/A Response: well Post Debridement 11.5x7.3x0.4 N/A N/A Measurements L x W x D (cm) Post Debridement 26.374 N/A N/A Volume: (cm) Post Debridement Category/Stage III N/A N/A Stage: Periwound Skin Texture: Edema: No Edema: No N/A Excoriation: No Excoriation: No Induration: No Induration: No Callus: No Callus: No Crepitus: No Crepitus: No Fluctuance: No Fluctuance: No Friable: No Friable: No Rash: No Rash: No Scarring: No Scarring: No Periwound Skin Maceration: No Maceration: No N/A Moisture: Moist: No Moist: No Dry/Scaly: No Dry/Scaly:  No Periwound Skin Color: Atrophie Blanche: No Atrophie Blanche: No N/A Cyanosis: No Cyanosis: No Ecchymosis: No Ecchymosis: No Erythema: No Erythema: No Hemosiderin Staining: No Hemosiderin Staining: No Mottled: No Mottled: No Pallor: No Pallor: No Rubor: No Rubor: No Temperature: No Abnormality No Abnormality N/A NAGEE, GOATES A. ([id number]) Tenderness on No No N/A Palpation: Wound Preparation: Ulcer Cleansing: Ulcer Cleansing: N/A Rinsed/Irrigated with Rinsed/Irrigated with Saline Saline Topical Anesthetic Topical Anesthetic Applied: Other: lidocaine Applied: Other: lidocaine 4% 4% Procedures Performed: Debridement N/A N/A Treatment Notes Wound #3 (Medial Sacrum) 1. Cleansed with: Clean wound with Normal Saline 2. Anesthetic Topical Lidocaine 4% cream to  wound bed prior to debridement 4. Dressing Applied: Aquacel Ag 5. Secondary Dressing Applied Bordered Foam Dressing Wound #4 (Medial, Posterior Scrotum) 1. Cleansed with: Clean wound with Normal Saline 2. Anesthetic Topical Lidocaine 4% cream to wound bed prior to debridement 4. Dressing Applied: Aquacel Ag 5. Secondary Dressing Applied Bordered Foam Dressing Electronic Signature(s) Signed: 12/17/2014 4:13:51 PM By: Montey Hora Entered By: Montey Hora on 12/17/2014 16:13:51 [name] ([id number]) -------------------------------------------------------------------------------- [place] Details Patient Name: MIR, FULLILOVE A. Date of Service: 12/17/2014 1:00 PM Medical Record Number: [id number] Patient Account Number: [id number] Date of Birth/Sex: 29-Apr-1946 (69 y.o. Male) Treating RN: Montey Hora Primary Care Physician: [name] Other Clinician: Referring Physician: [name] Treating Physician/Extender: BURNS III, Charlean Sanfilippo in Treatment: 0 Active Inactive Orientation to the Wound Care Program Nursing Diagnoses: Knowledge deficit related to the wound healing center program Goals: Patient/caregiver will verbalize understanding of the [place] Program Date Initiated: 12/17/2014 Goal Status: Active Interventions: Provide education on orientation to the wound center Notes: Pressure Nursing Diagnoses: Knowledge deficit related to management of pressures ulcers Potential for impaired tissue integrity related to pressure, friction, moisture, and shear Goals: Patient will remain free from development of additional pressure ulcers Date Initiated: 12/17/2014 Goal Status: Active Interventions: Assess: immobility, friction, shearing, incontinence upon admission and as needed Provide education on pressure ulcers Notes: Wound/Skin Impairment Nursing Diagnoses: Impaired tissue integrity GoalsCORNIE, HERRINGTON  ([id number]) Ulcer/skin breakdown will have a volume reduction of 30% by week 4 Date Initiated: 12/17/2014 Goal Status: Active Interventions: Assess ulceration(s) every visit Notes: Electronic Signature(s) Signed: 12/17/2014 4:13:38 PM By: Montey Hora Entered By: Montey Hora on 12/17/2014 16:13:37 [name] ([id number]) -------------------------------------------------------------------------------- Patient/Caregiver Education Details Patient Name: Bradley Loa A. Date of Service: 12/17/2014 1:00 PM Medical Record Number: [id number] Patient Account Number: [id number] Date of Birth/Gender: 03-21-1946 (69 y.o. Male) Treating RN: Montey Hora Primary Care Physician: [name] Other Clinician: Referring Physician: [name] Treating Physician/Extender: BURNS III, Charlean Sanfilippo in Treatment: 0 Education Assessment Education Provided To: Patient Education Topics Provided Wound/Skin Impairment: Handouts: Other: wound care as ordered Methods: Demonstration, Explain/Verbal Responses: State content correctly Electronic Signature(s) Signed: 12/17/2014 5:29:58 PM By: Montey Hora Entered By: Montey Hora on 12/17/2014 14:30:18 [name] ([id number]) -------------------------------------------------------------------------------- Wound Assessment Details Patient Name: Bradley Loa A. Date of Service: 12/17/2014 1:00 PM Medical Record Number: [id number] Patient Account Number: [id number] Date of Birth/Sex: 28-May-1946 (69 y.o. Male) Treating RN: Montey Hora Primary Care Physician: [name] Other Clinician: Referring Physician: [name] Treating Physician/Extender: BURNS III, WALTER Weeks in Treatment: 0 Wound Status Wound Number: 3 Primary Pressure Ulcer Etiology: Wound Location: Sacrum - Medial Wound Status: Open Wounding Event: Gradually Appeared Comorbid Hypertension, Rheumatoid Arthritis, Date Acquired:  07/07/2014 History: Paraplegia Weeks Of Treatment: 0 Clustered Wound: No Photos Photo Uploaded By: Montey Hora on 12/17/2014 17:28:21 Wound Measurements Length: (  cm) 11.5 Width: (cm) 7.3 Depth: (cm) 0.3 Area: (cm) 65.934 Volume: (cm) 19.78 % Reduction in Area: 0% % Reduction in Volume: 0% Epithelialization: Medium (34-66%) Tunneling: No Undermining: No Wound Description Classification: Category/Stage III Wound Margin: Flat and Intact Exudate Amount: Medium Exudate Type: Serous Exudate Color: amber Foul Odor After Cleansing: No Wound Bed Granulation Amount: Medium (34-66%) Exposed Structure Granulation Quality: Red Fascia Exposed: No Necrotic Amount: Medium (34-66%) Fat Layer Exposed: No Necrotic Quality: Adherent Slough Tendon Exposed: No KEITON, COSMA A. ([id number]) Muscle Exposed: No Joint Exposed: No Bone Exposed: No Limited to Skin Breakdown Periwound Skin Texture Texture Color No Abnormalities Noted: No No Abnormalities Noted: No Callus: No Atrophie Blanche: No Crepitus: No Cyanosis: No Excoriation: No Ecchymosis: No Fluctuance: No Erythema: No Friable: No Hemosiderin Staining: No Induration: No Mottled: No Localized Edema: No Pallor: No Rash: No Rubor: No Scarring: No Temperature / Pain Moisture Temperature: No Abnormality No Abnormalities Noted: No Dry / Scaly: No Maceration: No Moist: No Wound Preparation Ulcer Cleansing: Rinsed/Irrigated with Saline Topical Anesthetic Applied: Other: lidocaine 4%, Treatment Notes Wound #3 (Medial Sacrum) 1. Cleansed with: Clean wound with Normal Saline 2. Anesthetic Topical Lidocaine 4% cream to wound bed prior to debridement 4. Dressing Applied: Aquacel Ag 5. Secondary Dressing Applied Bordered Foam Dressing Electronic Signature(s) Signed: 12/17/2014 5:29:58 PM By: Montey Hora Entered By: Montey Hora on 12/17/2014 14:12:14 [name]  ([id number]) -------------------------------------------------------------------------------- Wound Assessment Details Patient Name: Bradley Loa A. Date of Service: 12/17/2014 1:00 PM Medical Record Number: [id number] Patient Account Number: [id number] Date of Birth/Sex: 28-Dec-1945 (69 y.o. Male) Treating RN: Montey Hora Primary Care Physician: [name] Other Clinician: Referring Physician: [name] Treating Physician/Extender: BURNS III, Charlean Sanfilippo in Treatment: 0 Wound Status Wound Number: 4 Primary Pressure Ulcer Etiology: Wound Location: Scrotum - Medial, Posterior Wound Status: Open Wounding Event: Gradually Appeared Comorbid Hypertension, Rheumatoid Arthritis, Date Acquired: 07/07/2014 History: Paraplegia Weeks Of Treatment: 0 Clustered Wound: No Photos Photo Uploaded By: Montey Hora on 12/17/2014 17:28:22 Wound Measurements Length: (cm) 1.3 Width: (cm) 1 Depth: (cm) 0.1 Area: (cm) 1.021 Volume: (cm) 0.102 % Reduction in Area: 0% % Reduction in Volume: 0% Epithelialization: None Tunneling: No Undermining: No Wound Description Classification: Category/Stage II Wound Margin: Flat and Intact Exudate Amount: Small Exudate Type: Serous Exudate Color: amber Foul Odor After Cleansing: No Wound Bed Granulation Amount: Large (67-100%) Exposed Structure Granulation Quality: Red Fascia Exposed: No Fat Layer Exposed: No Tendon Exposed: No BOYCE, KELTNER A. ([id number]) Muscle Exposed: No Joint Exposed: No Bone Exposed: No Limited to Skin Breakdown Periwound Skin Texture Texture Color No Abnormalities Noted: No No Abnormalities Noted: No Callus: No Atrophie Blanche: No Crepitus: No Cyanosis: No Excoriation: No Ecchymosis: No Fluctuance: No Erythema: No Friable: No Hemosiderin Staining: No Induration: No Mottled: No Localized Edema: No Pallor: No Rash: No Rubor: No Scarring: No Temperature / Pain Moisture Temperature: No  Abnormality No Abnormalities Noted: Yes Wound Preparation Ulcer Cleansing: Rinsed/Irrigated with Saline Topical Anesthetic Applied: Other: lidocaine 4%, Treatment Notes Wound #4 (Medial, Posterior Scrotum) 1. Cleansed with: Clean wound with Normal Saline 2. Anesthetic Topical Lidocaine 4% cream to wound bed prior to debridement 4. Dressing Applied: Aquacel Ag 5. Secondary Dressing Applied Bordered Foam Dressing Electronic Signature(s) Signed: 12/17/2014 5:29:58 PM By: Montey Hora Entered By: Montey Hora on 12/17/2014 14:13:08 [name] ([id number]) -------------------------------------------------------------------------------- [place] Details Patient Name: Bradley Loa A. Date of Service: 12/17/2014 1:00 PM Medical Record Number: [id number] Patient Account Number: [id number] Date of Birth/Sex: [birth year] (69 y.o.  Male) Treating RN: Montey Hora Primary Care Physician: [name] Other Clinician: Referring Physician: [name] Treating Physician/Extender: BURNS III, Charlean Sanfilippo in Treatment: 0 Vital Signs Time Taken: 13:43 Temperature (F): 97.9 Height (in): 73 Pulse (bpm): 75 Source: Stated Respiratory Rate (breaths/min): 16 Weight (lbs): 205 Blood Pressure (mmHg): 129/55 Source: Stated Reference Range: 80 - 120 mg / dl Body Mass Index (BMI): 27 Electronic Signature(s) Signed: 12/17/2014 5:29:58 PM By: Montey Hora Entered By: Montey Hora on 12/17/2014 13:45:06

## 2014-12-18 NOTE — Progress Notes (Signed)
Bradley, [name] ([id number]) Visit Report for 12/17/2014 Abuse/Suicide Risk Screen Details Patient Name: Bradley [name], Bradley [name]. Date of Service: 12/17/2014 1:00 PM Medical Record Number: [id number] Patient Account Number: [id number] Date of Birth/Sex: [birth year] (69 y.o. Male) Treating RN: Montey Hora Primary Care Physician: [name] Other Clinician: Referring Physician: [name] Treating Physician/Extender: BURNS III, Charlean Sanfilippo in Treatment: 0 Abuse/Suicide Risk Screen Items Answer ABUSE/SUICIDE RISK SCREEN: Has anyone close to you tried to hurt or harm you recentlyo No Do you feel uncomfortable with anyone in your familyo No Has anyone forced you do things that you didnot want to doo No Do you have any thoughts of harming yourselfo No Patient displays signs or symptoms of abuse and/or neglect. No Electronic Signature(s) Signed: 12/17/2014 5:29:58 PM By: Montey Hora Entered By: Montey Hora on 12/17/2014 13:54:39 [name] ([id number]) -------------------------------------------------------------------------------- Activities of Daily Living Details Patient Name: Bradley, [name] Date of Service: 12/17/2014 1:00 PM Medical Record Number: [id number] Patient Account Number: [id number] Date of Birth/Sex: 1945/07/25 (69 y.o. Male) Treating RN: Montey Hora Primary Care Physician: [name] Other Clinician: Referring Physician: [name] Treating Physician/Extender: BURNS III, Charlean Sanfilippo in Treatment: 0 Activities of Daily Living Items Answer Activities of Daily Living (Please select one for each item) Drive Automobile Not Able Take Medications Completely Able Use Telephone Completely Able Care for Appearance Completely Able Use Toilet Completely Able Bath / Shower Completely Able Dress Self Completely Able Feed Self Completely Able Walk Not Able Get In / Out Bed Completely Able Housework Completely Able Prepare Meals  Completely [place] for Self Need Assistance Electronic Signature(s) Signed: 12/17/2014 5:29:58 PM By: Montey Hora Entered By: Montey Hora on 12/17/2014 13:55:44 [name] ([id number]) -------------------------------------------------------------------------------- Education Assessment Details Patient Name: Bradley Loa A. Date of Service: 12/17/2014 1:00 PM Medical Record Number: [id number] Patient Account Number: [id number] Date of Birth/Sex: [birth year] (69 y.o. Male) Treating RN: Montey Hora Primary Care Physician: [name] Other Clinician: Referring Physician: [name] Treating Physician/Extender: BURNS III, Charlean Sanfilippo in Treatment: 0 Primary Learner Assessed: Patient Learning Preferences/Education Level/Primary Language Learning Preference: Explanation, Demonstration Highest Education Level: High School Preferred Language: English Cognitive Barrier Assessment/Beliefs Language Barrier: No Translator Needed: No Memory Deficit: No Emotional Barrier: No Cultural/Religious Beliefs Affecting Medical No Care: Physical Barrier Assessment Impaired Vision: No Impaired Hearing: No Decreased Hand dexterity: No Knowledge/Comprehension Assessment Knowledge Level: Medium Comprehension Level: Medium Ability to understand written Medium instructions: Ability to understand verbal Medium instructions: Motivation Assessment Anxiety Level: Calm Cooperation: Cooperative Education Importance: Acknowledges Need Interest in Health Problems: Asks Questions Perception: Coherent Willingness to Engage in Self- Medium Management Activities: Readiness to Engage in Self- Medium Management Activities: Electronic Signature(s) KIMSEY, DEMAREE ([id number]) Signed: 12/17/2014 5:29:58 PM By: Montey Hora Entered By: Montey Hora on 12/17/2014 13:56:07 [name]  ([id number]) -------------------------------------------------------------------------------- Fall Risk Assessment Details Patient Name: [name]. Date of Service: 12/17/2014 1:00 PM Medical Record Number: [id number] Patient Account Number: [id number] Date of Birth/Sex: [birth year] (69 y.o. Male) Treating RN: Montey Hora Primary Care Physician: [name] Other Clinician: Referring Physician: [name] Treating Physician/Extender: BURNS III, Charlean Sanfilippo in Treatment: 0 Fall Risk Assessment Items FALL RISK ASSESSMENT: History of falling - immediate or within 3 months 0 No Secondary diagnosis 0 No Ambulatory aid None/bed rest/wheelchair/nurse 0 Yes Crutches/cane/walker 0 No Furniture 0 No IV Access/Saline Lock 0 No Gait/Training Normal/bed rest/immobile 0 Yes Weak 0 No Impaired 0 No Mental Status Oriented to own ability 0 Yes Electronic  Signature(s) Signed: 12/17/2014 5:29:58 PM By: Montey Hora Entered By: Montey Hora on 12/17/2014 13:56:34 [name] ([id number]) -------------------------------------------------------------------------------- Nutrition Risk Assessment Details Patient Name: Bradley, [name] Date of Service: 12/17/2014 1:00 PM Medical Record Number: [id number] Patient Account Number: [id number] Date of Birth/Sex: 02-02-1946 (69 y.o. Male) Treating RN: Montey Hora Primary Care Physician: [name] Other Clinician: Referring Physician: [name] Treating Physician/Extender: BURNS III, WALTER Weeks in Treatment: 0 Height (in): 73 Weight (lbs): 205 Body Mass Index (BMI): 27 Nutrition Risk Assessment Items NUTRITION RISK SCREEN: I have an illness or condition that made me change the kind and/or 0 No amount of food I eat I eat fewer than two meals per day 0 No I eat few fruits and vegetables, or milk products 0 No I have three or more drinks of beer, liquor or wine almost every day 0 No I have tooth or  mouth problems that make it hard for me to eat 0 No I don't always have enough money to buy the food I need 0 No I eat alone most of the time 0 No I take three or more different prescribed or over-the-counter drugs a 1 Yes day Without wanting to, I have lost or gained 10 pounds in the last six 0 No months I am not always physically able to shop, cook and/or feed myself 0 No Nutrition Protocols Good Risk Protocol 0 No interventions needed Moderate Risk Protocol Electronic Signature(s) Signed: 12/17/2014 5:29:58 PM By: Montey Hora Entered By: Montey Hora on 12/17/2014 13:56:41

## 2014-12-24 ENCOUNTER — Encounter: Payer: Medicare Other | Admitting: Surgery

## 2014-12-24 DIAGNOSIS — L89153 Pressure ulcer of sacral region, stage 3: Secondary | ICD-10-CM | POA: Diagnosis not present
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
## 2014-12-25 NOTE — Progress Notes (Signed)
Bradley [name], Bradley [name] ([id number]) Visit Report for 12/24/2014 Arrival Information Details Patient Name: Bradley [name], Bradley [name]. Date of Service: 12/24/2014 1:00 PM Medical Record Number: [id number] Patient Account Number: [id number] Date of Birth/Sex: [birth year] (69 y.o. Male) Treating RN: Cornell Barman Primary Care Physician: [name] Other Clinician: Referring Physician: [name] Treating Physician/Extender: BURNS III, Charlean Sanfilippo in Treatment: 1 Visit Information History Since Last Visit Added or deleted any medications: No Patient Arrived: Wheel Chair Any new allergies or adverse reactions: No Arrival Time: 13:18 Had a fall or experienced change in No activities of daily living that may affect Accompanied By: self risk of falls: Transfer Assistance: Hoyer Lift Signs or symptoms of abuse/neglect since last No Patient Identification Verified: Yes visito Secondary Verification Process Yes Hospitalized since last visit: No Completed: Has Dressing in Place as Prescribed: Yes Pain Present Now: No Electronic Signature(s) Signed: 12/24/2014 4:45:53 PM By: Gretta Cool, RN, BSN, Kim RN, BSN Entered By: Gretta Cool, RN, BSN, Kim on 12/24/2014 13:19:11 [name] ([id number]) -------------------------------------------------------------------------------- Encounter Discharge Information Details Patient Name: Bradley Loa A. Date of Service: 12/24/2014 1:00 PM Medical Record Number: [id number] Patient Account Number: [id number] Date of Birth/Sex: 1945-07-22 (69 y.o. Male) Treating RN: Cornell Barman Primary Care Physician: [name] Other Clinician: Referring Physician: [name] Treating Physician/Extender: BURNS III, Charlean Sanfilippo in Treatment: 1 Encounter Discharge Information Items Discharge Pain Level: 0 Discharge Condition: Stable Ambulatory Status: Wheelchair Discharge Destination: Home Private Transportation: Auto Accompanied By: self Schedule Follow-up  Appointment: Yes Medication Reconciliation completed and Yes provided to Patient/Care Virda Betters: Clinical Summary of Care: Electronic Signature(s) Signed: 12/24/2014 4:45:53 PM By: Gretta Cool, RN, BSN, Kim RN, BSN Entered By: Gretta Cool, RN, BSN, Kim on 12/24/2014 13:42:19 [name] ([id number]) -------------------------------------------------------------------------------- Multi Wound Chart Details Patient Name: Bradley Loa A. Date of Service: 12/24/2014 1:00 PM Medical Record Number: [id number] Patient Account Number: [id number] Date of Birth/Sex: 03-19-1946 (70 y.o. Male) Treating RN: Cornell Barman Primary Care Physician: [name] Other Clinician: Referring Physician: [name] Treating Physician/Extender: BURNS III, Charlean Sanfilippo in Treatment: 1 Vital Signs Height(in): 73 Pulse(bpm): 99 Weight(lbs): 205 Blood Pressure 118/68 (mmHg): Body Mass Index(BMI): 27 Temperature(F): 98.0 Respiratory Rate 16 (breaths/min): Photos: [3:No Photos] [4:No Photos] [N/A:N/A] Wound Location: [3:Medial Sacrum] [4:Medial, Posterior Scrotum] [N/A:N/A] Wounding Event: [3:Gradually Appeared] [4:Gradually Appeared] [N/A:N/A] Primary Etiology: [3:Pressure Ulcer] [4:Pressure Ulcer] [N/A:N/A] Date Acquired: [3:07/07/2014] [4:07/07/2014] [N/A:N/A] Weeks of Treatment: [3:1] [4:1] [N/A:N/A] Wound Status: [3:Open] [4:Open] [N/A:N/A] Measurements L x W x D 10.5x8.1x0.3 [4:0x0x0] [N/A:N/A] (cm) Area (cm) : [3:66.798] [4:0] [N/A:N/A] Volume (cm) : [3:20.039] [4:0] [N/A:N/A] % Reduction in Area: [3:-1.30%] [4:100.00%] [N/A:N/A] % Reduction in Volume: -1.30% [4:100.00%] [N/A:N/A] Classification: [3:Category/Stage III] [4:Category/Stage II] [N/A:N/A] Periwound Skin Texture: No Abnormalities Noted [4:No Abnormalities Noted] [N/A:N/A] Periwound Skin [3:No Abnormalities Noted] [4:No Abnormalities Noted] [N/A:N/A] Moisture: Periwound Skin Color: No Abnormalities Noted [4:No Abnormalities Noted]  [N/A:N/A] Tenderness on [3:No] [4:No] [N/A:N/A] Treatment Notes Electronic Signature(s) Signed: 12/24/2014 4:45:53 PM By: Gretta Cool, RN, BSN, Kim RN, BSN Entered By: Gretta Cool, RN, BSN, Kim on 12/24/2014 13:37:12 [name] ([id number]) -------------------------------------------------------------------------------- [place] Details Patient Name: Bradley [name], Bradley [name] A. Date of Service: 12/24/2014 1:00 PM Medical Record Number: [id number] Patient Account Number: [id number] Date of Birth/Sex: 01-Sep-1945 (69 y.o. Male) Treating RN: Cornell Barman Primary Care Physician: [name] Other Clinician: Referring Physician: [name] Treating Physician/Extender: BURNS III, Charlean Sanfilippo in Treatment: 1 Active Inactive Orientation to the Wound Care Program Nursing Diagnoses: Knowledge deficit related to the wound healing center program Goals: Patient/caregiver will verbalize understanding  of the [place] Program Date Initiated: 12/17/2014 Goal Status: Active Interventions: Provide education on orientation to the wound center Notes: Pressure Nursing Diagnoses: Knowledge deficit related to management of pressures ulcers Potential for impaired tissue integrity related to pressure, friction, moisture, and shear Goals: Patient will remain free from development of additional pressure ulcers Date Initiated: 12/17/2014 Goal Status: Active Interventions: Assess: immobility, friction, shearing, incontinence upon admission and as needed Provide education on pressure ulcers Notes: Wound/Skin Impairment Nursing Diagnoses: Impaired tissue integrity Goals: Bradley [name], Bradley [name] ([id number]) Ulcer/skin breakdown will have a volume reduction of 30% by week 4 Date Initiated: 12/17/2014 Goal Status: Active Interventions: Assess ulceration(s) every visit Notes: Electronic Signature(s) Signed: 12/24/2014 4:45:53 PM By: Gretta Cool, RN, BSN, Kim RN, BSN Entered By: Gretta Cool, RN,  BSN, Kim on 12/24/2014 13:37:05 [name] ([id number]) -------------------------------------------------------------------------------- Pain Assessment Details Patient Name: Bradley Loa A. Date of Service: 12/24/2014 1:00 PM Medical Record Number: [id number] Patient Account Number: [id number] Date of Birth/Sex: 19-Mar-1946 (70 y.o. Male) Treating RN: Cornell Barman Primary Care Physician: [name] Other Clinician: Referring Physician: [name] Treating Physician/Extender: BURNS III, Charlean Sanfilippo in Treatment: 1 Active Problems Location of Pain Severity and Description of Pain Patient Has Paino No Site Locations Pain Management and Medication Current Pain Management: Electronic Signature(s) Signed: 12/24/2014 4:45:53 PM By: Gretta Cool, RN, BSN, Kim RN, BSN Entered By: Gretta Cool, RN, BSN, Kim on 12/24/2014 13:19:16 [name] ([id number]) -------------------------------------------------------------------------------- Patient/Caregiver Education Details Patient Name: Bradley Loa A. Date of Service: 12/24/2014 1:00 PM Medical Record Number: [id number] Patient Account Number: [id number] Date of Birth/Gender: Jan 17, 1946 (69 y.o. Male) Treating RN: Cornell Barman Primary Care Physician: [name] Other Clinician: Referring Physician: [name] Treating Physician/Extender: BURNS III, Charlean Sanfilippo in Treatment: 1 Education Assessment Education Provided To: Patient Education Topics Provided Pressure: Handouts: Other: keep pressure off backside as much as possble. Wound/Skin Impairment: Handouts: Caring for Your Ulcer Electronic Signature(s) Signed: 12/24/2014 4:45:53 PM By: Gretta Cool, RN, BSN, Kim RN, BSN Entered By: Gretta Cool, RN, BSN, Kim on 12/24/2014 13:44:18 [name] ([id number]) -------------------------------------------------------------------------------- Wound Assessment Details Patient Name: Bradley [name], Bradley [name] A. Date of Service: 12/24/2014  1:00 PM Medical Record Number: [id number] Patient Account Number: [id number] Date of Birth/Sex: [birth year] (69 y.o. Male) Treating RN: Cornell Barman Primary Care Physician: [name] Other Clinician: Referring Physician: [name] Treating Physician/Extender: BURNS III, Charlean Sanfilippo in Treatment: 1 Wound Status Wound Number: 3 Primary Etiology: Pressure Ulcer Wound Location: Medial Sacrum Wound Status: Open Wounding Event: Gradually Appeared Date Acquired: 07/07/2014 Weeks Of Treatment: 1 Clustered Wound: No Photos Photo Uploaded By: Gretta Cool, RN, BSN, Kim on 12/24/2014 14:51:44 Wound Measurements Length: (cm) 10.5 Width: (cm) 8.1 Depth: (cm) 0.3 Area: (cm) 66.798 Volume: (cm) 20.039 % Reduction in Area: -1.3% % Reduction in Volume: -1.3% Wound Description Classification: Category/Stage III Periwound Skin Texture Texture Color No Abnormalities Noted: No No Abnormalities Noted: No Moisture No Abnormalities Noted: No Treatment Notes Wound #3 (Medial Sacrum) 1. Cleansed with: KENTRAIL, SHEW ([id number]) Clean wound with Normal Saline 2. Anesthetic Topical Lidocaine 4% cream to wound bed prior to debridement 4. Dressing Applied: Aquacel Ag 5. Secondary Dressing Applied Bordered Foam Dressing Electronic Signature(s) Signed: 12/24/2014 4:45:53 PM By: Gretta Cool, RN, BSN, Kim RN, BSN Entered By: Gretta Cool, RN, BSN, Kim on 12/24/2014 13:26:14 [name] ([id number]) -------------------------------------------------------------------------------- Wound Assessment Details Patient Name: Bradley [name], Bradley [name] A. Date of Service: 12/24/2014 1:00 PM Medical Record Number: [id number] Patient Account Number: [id number] Date of Birth/Sex: 03-May-1946 (69 y.o. Male) Treating RN: Cornell Barman  Primary Care Physician: [name] Other Clinician: Referring Physician: [name] Treating Physician/Extender: BURNS III, Charlean Sanfilippo in Treatment: 1 Wound Status Wound  Number: 4 Primary Pressure Ulcer Etiology: Wound Location: Scrotum - Medial, Posterior Wound Status: Healed - Epithelialized Wounding Event: Gradually Appeared Comorbid Hypertension, Rheumatoid Arthritis, Date Acquired: 07/07/2014 History: Paraplegia Weeks Of Treatment: 1 Clustered Wound: No Photos Photo Uploaded By: Gretta Cool, RN, BSN, Kim on 12/24/2014 14:51:45 Wound Measurements Length: (cm) 0 Width: (cm) 0 Depth: (cm) 0 Area: (cm) 0 Volume: (cm) 0 % Reduction in Area: 100% % Reduction in Volume: 100% Wound Description Classification: Category/Stage II Periwound Skin Texture Texture Color No Abnormalities Noted: No No Abnormalities Noted: No Moisture No Abnormalities Noted: No Electronic Signature(s) Signed: 12/24/2014 4:45:53 PM By: Gretta Cool, RN, BSN, Kim RN, BSN Bradley [name], Bradley [name] ([id number]) Entered By: Gretta Cool, RN, BSN, Kim on 12/24/2014 13:41:42 [name] ([id number]) -------------------------------------------------------------------------------- Vitals Details Patient Name: Bradley Loa A. Date of Service: 12/24/2014 1:00 PM Medical Record Number: [id number] Patient Account Number: [id number] Date of Birth/Sex: 1945/12/24 (69 y.o. Male) Treating RN: Cornell Barman Primary Care Physician: [name] Other Clinician: Referring Physician: [name] Treating Physician/Extender: BURNS III, Charlean Sanfilippo in Treatment: 1 Vital Signs Time Taken: 13:19 Temperature (F): 98.0 Height (in): 73 Pulse (bpm): 99 Weight (lbs): 205 Respiratory Rate (breaths/min): 16 Body Mass Index (BMI): 27 Blood Pressure (mmHg): 118/68 Reference Range: 80 - 120 mg / dl Electronic Signature(s) Signed: 12/24/2014 4:45:53 PM By: Gretta Cool, RN, BSN, Kim RN, BSN Entered By: Gretta Cool, RN, BSN, Kim on 12/24/2014 13:19:59

## 2014-12-31 ENCOUNTER — Ambulatory Visit: Payer: Medicare Other | Admitting: Surgery

## 2015-01-07 ENCOUNTER — Encounter: Payer: Medicare Other | Attending: Surgery | Admitting: Surgery

## 2015-01-07 DIAGNOSIS — L89323 Pressure ulcer of left buttock, stage 3: Secondary | ICD-10-CM | POA: Insufficient documentation

## 2015-01-07 DIAGNOSIS — L89153 Pressure ulcer of sacral region, stage 3: Secondary | ICD-10-CM | POA: Insufficient documentation

## 2015-01-07 DIAGNOSIS — G8220 Paraplegia, unspecified: Secondary | ICD-10-CM | POA: Diagnosis not present

## 2015-01-07 DIAGNOSIS — L89313 Pressure ulcer of right buttock, stage 3: Secondary | ICD-10-CM | POA: Diagnosis not present

## 2015-01-08 NOTE — Progress Notes (Signed)
JAMARRIUS, SALAY ([id number]) Visit Report for 01/07/2015 Arrival Information Details Patient Name: Bradley [name], Bradley [name]. Date of Service: 01/07/2015 1:00 PM Medical Record Number: [id number] Patient Account Number: [id number] Date of Birth/Sex: [birth year] (69 y.o. Male) Treating RN: Baruch Gouty, RN, BSN, Velva Harman Primary Care Physician: [name] Other Clinician: Referring Physician: [name] Treating Physician/Extender: BURNS III, Charlean Sanfilippo in Treatment: 3 Visit Information History Since Last Visit Added or deleted any medications: No Patient Arrived: Wheel Chair Any new allergies or adverse reactions: No Arrival Time: 13:17 Had a fall or experienced change in No activities of daily living that may affect Accompanied By: self risk of falls: Transfer Assistance: [employer] fast streamer Signs or symptoms of abuse/neglect since last No Patient Identification Verified: Yes visito Secondary Verification Process Yes Hospitalized since last visit: No Completed: Has Dressing in Place as Prescribed: Yes Patient Requires Transmission-Based No Pain Present Now: No Precautions: Patient Has Alerts: No Electronic Signature(s) Signed: 01/07/2015 4:33:24 PM By: Regan Lemming BSN, RN Entered By: Regan Lemming on 01/07/2015 13:17:24 Bradley [name] ([id number]) -------------------------------------------------------------------------------- Clinic Level of Care Assessment Details Patient Name: Bradley Loa A. Date of Service: 01/07/2015 1:00 PM Medical Record Number: [id number] Patient Account Number: [id number] Date of Birth/Sex: 04-01-1946 (69 y.o. Male) Treating RN: Baruch Gouty, RN, BSN, [place] Primary Care Physician: [name] Other Clinician: Referring Physician: [name] Treating Physician/Extender: BURNS III, Charlean Sanfilippo in Treatment: 3 Clinic Level of Care Assessment Items TOOL 4 Quantity Score []  - Use when only an EandM is performed on FOLLOW-UP visit 0 ASSESSMENTS - Nursing  Assessment / Reassessment X - Reassessment of Co-morbidities (includes updates in patient status) 1 10 X - Reassessment of Adherence to Treatment Plan 1 5 ASSESSMENTS - Wound and Skin Assessment / Reassessment X - Simple Wound Assessment / Reassessment - one wound 1 5 []  - Complex Wound Assessment / Reassessment - multiple wounds 0 []  - Dermatologic / Skin Assessment (not related to wound area) 0 ASSESSMENTS - Focused Assessment []  - Circumferential Edema Measurements - multi extremities 0 []  - Nutritional Assessment / Counseling / Intervention 0 []  - Lower Extremity Assessment (monofilament, tuning fork, pulses) 0 []  - Peripheral Arterial Disease Assessment (using hand held doppler) 0 ASSESSMENTS - Ostomy and/or Continence Assessment and Care []  - Incontinence Assessment and Management 0 []  - Ostomy Care Assessment and Management (repouching, etc.) 0 PROCESS - Coordination of Care X - Simple Patient / Family Education for ongoing care 1 15 []  - Complex (extensive) Patient / Family Education for ongoing care 0 []  - Staff obtains Programmer, systems, Records, Test Results / Process Orders 0 []  - Staff telephones HHA, Nursing Homes / Clarify orders / etc 0 []  - Routine Transfer to another Facility (non-emergent condition) 0 Bradley [name], Bradley [name] ([id number]) []  - Routine Hospital Admission (non-emergent condition) 0 []  - New Admissions / Biomedical engineer / Ordering NPWT, Apligraf, etc. 0 []  - Emergency Hospital Admission (emergent condition) 0 X - Simple Discharge Coordination 1 10 []  - Complex (extensive) Discharge Coordination 0 PROCESS - Special Needs []  - Pediatric / Minor Patient Management 0 []  - Isolation Patient Management 0 []  - Hearing / Language / Visual special needs 0 []  - Assessment of Community assistance (transportation, D/C planning, etc.) 0 []  - Additional assistance / Altered mentation 0 []  - Support Surface(s) Assessment (bed, cushion, seat, etc.) 0 INTERVENTIONS - Wound  Cleansing / Measurement X - Simple Wound Cleansing - one wound 1 5 []  - Complex Wound Cleansing - multiple wounds 0 X - Wound Imaging (photographs -  any number of wounds) 1 5 []  - Wound Tracing (instead of photographs) 0 X - Simple Wound Measurement - one wound 1 5 []  - Complex Wound Measurement - multiple wounds 0 INTERVENTIONS - Wound Dressings X - Small Wound Dressing one or multiple wounds 1 10 []  - Medium Wound Dressing one or multiple wounds 0 []  - Large Wound Dressing one or multiple wounds 0 []  - Application of Medications - topical 0 []  - Application of Medications - injection 0 INTERVENTIONS - Miscellaneous []  - External ear exam 0 Bradley [name], Bradley A. ([id number]) []  - Specimen Collection (cultures, biopsies, blood, body fluids, etc.) 0 []  - Specimen(s) / Culture(s) sent or taken to Lab for analysis 0 X - Patient Transfer (multiple staff / Harrel Lemon Lift / Similar devices) 1 10 []  - Simple Staple / Suture removal (25 or less) 0 []  - Complex Staple / Suture removal (26 or more) 0 []  - Hypo / Hyperglycemic Management (close monitor of Blood Glucose) 0 []  - Ankle / Brachial Index (ABI) - do not check if billed separately 0 X - Vital Signs 1 5 Has the patient been seen at the hospital within the last three years: Yes Total Score: 85 Level Of Care: New/Established - Level 3 Electronic Signature(s) Signed: 01/07/2015 4:33:24 PM By: Regan Lemming BSN, RN Entered By: Regan Lemming on 01/07/2015 13:29:27 Bradley [name] ([id number]) -------------------------------------------------------------------------------- Encounter Discharge Information Details Patient Name: Bradley Loa A. Date of Service: 01/07/2015 1:00 PM Medical Record Number: [id number] Patient Account Number: [id number] Date of Birth/Sex: 1945-07-11 (69 y.o. Male) Treating RN: Baruch Gouty, RN, BSN, Velva Harman Primary Care Physician: [name] Other Clinician: Referring Physician: [name] Treating Physician/Extender:  BURNS III, Charlean Sanfilippo in Treatment: 3 Encounter Discharge Information Items Discharge Pain Level: 0 Discharge Condition: Stable Ambulatory Status: Wheelchair Discharge Destination: Home Transportation: Other Schedule Follow-up Appointment: No Medication Reconciliation completed No and provided to Patient/Care Bradley [name]: Provided on Clinical Summary of Care: 01/07/2015 Form Type Recipient Paper Patient Bradley [name] Electronic Signature(s) Signed: 01/07/2015 1:42:35 PM By: Ruthine Dose Entered By: Ruthine Dose on 01/07/2015 13:42:35 Bradley [name] ([id number]) -------------------------------------------------------------------------------- Lower Extremity Assessment Details Patient Name: Bradley Loa A. Date of Service: 01/07/2015 1:00 PM Medical Record Number: [id number] Patient Account Number: [id number] Date of Birth/Sex: 1946/03/22 (69 y.o. Male) Treating RN: Baruch Gouty, RN, BSN, Velva Harman Primary Care Physician: [name] Other Clinician: Referring Physician: [name] Treating Physician/Extender: BURNS III, Charlean Sanfilippo in Treatment: 3 Electronic Signature(s) Signed: 01/07/2015 4:33:24 PM By: Regan Lemming BSN, RN Entered By: Regan Lemming on 01/07/2015 13:18:17 Bradley [name] ([id number]) -------------------------------------------------------------------------------- Multi Wound Chart Details Patient Name: Bradley Loa A. Date of Service: 01/07/2015 1:00 PM Medical Record Number: [id number] Patient Account Number: [id number] Date of Birth/Sex: [birth year] (69 y.o. Male) Treating RN: Baruch Gouty, RN, BSN, Velva Harman Primary Care Physician: [name] Other Clinician: Referring Physician: [name] Treating Physician/Extender: BURNS III, Charlean Sanfilippo in Treatment: 3 Vital Signs Height(in): 73 Pulse(bpm): 77 Weight(lbs): 205 Blood Pressure 119/79 (mmHg): Body Mass Index(BMI): 27 Temperature(F): 97.7 Respiratory Rate 16 (breaths/min): Photos: [3:No Photos]  [N/A:N/A] Wound Location: [3:Sacrum - Medial] [N/A:N/A] Wounding Event: [3:Gradually Appeared] [N/A:N/A] Primary Etiology: [3:Pressure Ulcer] [N/A:N/A] Comorbid History: [3:Hypertension, Rheumatoid Arthritis, Paraplegia] [N/A:N/A] Date Acquired: [3:07/07/2014] [N/A:N/A] Weeks of Treatment: [3:3] [N/A:N/A] Wound Status: [3:Open] [N/A:N/A] Measurements L x W x D 12x9x0.2 [N/A:N/A] (cm) Area (cm) : [6:22.297] [N/A:N/A] Volume (cm) : [3:16.965] [N/A:N/A] % Reduction in Area: [3:-28.60%] [N/A:N/A] % Reduction in Volume: 14.20% [N/A:N/A] Classification: [3:Category/Stage III] [N/A:N/A] Exudate Amount: [3:Medium] [N/A:N/A] Exudate Type: [3:Serosanguineous] [  N/A:N/A] Exudate Color: [3:red, brown] [N/A:N/A] Wound Margin: [3:Distinct, outline attached] [N/A:N/A] Granulation Amount: [3:Large (67-100%)] [N/A:N/A] Necrotic Amount: [3:None Present (0%)] [N/A:N/A] Exposed Structures: [3:Fascia: No Fat: No Tendon: No Muscle: No Joint: No Bone: No] [N/A:N/A] Limited to Skin Breakdown Epithelialization: Medium (34-66%) N/A N/A Periwound Skin Texture: Scarring: Yes N/A N/A Edema: No Excoriation: No Induration: No Callus: No Crepitus: No Fluctuance: No Friable: No Rash: No Periwound Skin Moist: Yes N/A N/A Moisture: Maceration: No Dry/Scaly: No Periwound Skin Color: Atrophie Blanche: No N/A N/A Cyanosis: No Ecchymosis: No Erythema: No Hemosiderin Staining: No Mottled: No Pallor: No Rubor: No Temperature: No Abnormality N/A N/A Tenderness on No N/A N/A Palpation: Wound Preparation: Ulcer Cleansing: N/A N/A Rinsed/Irrigated with Saline Topical Anesthetic Applied: None Treatment Notes Electronic Signature(s) Signed: 01/07/2015 4:33:24 PM By: Regan Lemming BSN, RN Entered By: Regan Lemming on 01/07/2015 13:27:32 Bradley [name] ([id number]) -------------------------------------------------------------------------------- Brush Details Patient Name: Bradley Loa A. Date of Service: 01/07/2015 1:00 PM Medical Record Number: [id number] Patient Account Number: [id number] Date of Birth/Sex: 1946/04/02 (69 y.o. Male) Treating RN: Baruch Gouty, RN, BSN, Velva Harman Primary Care Physician: [name] Other Clinician: Referring Physician: [name] Treating Physician/Extender: BURNS III, Charlean Sanfilippo in Treatment: 3 Active Inactive Orientation to the Wound Care Program Nursing Diagnoses: Knowledge deficit related to the wound healing center program Goals: Patient/caregiver will verbalize understanding of the [facility] Date Initiated: 12/17/2014 Goal Status: Active Interventions: Provide education on orientation to the wound center Notes: Pressure Nursing Diagnoses: Knowledge deficit related to management of pressures ulcers Potential for impaired tissue integrity related to pressure, friction, moisture, and shear Goals: Patient will remain free from development of additional pressure ulcers Date Initiated: 12/17/2014 Goal Status: Active Interventions: Assess: immobility, friction, shearing, incontinence upon admission and as needed Provide education on pressure ulcers Notes: Wound/Skin Impairment Nursing Diagnoses: Impaired tissue integrity GoalsKEEDAN, Bradley [name] ([id number]) Ulcer/skin breakdown will have a volume reduction of 30% by week 4 Date Initiated: 12/17/2014 Goal Status: Active Interventions: Assess ulceration(s) every visit Notes: Electronic Signature(s) Signed: 01/07/2015 4:33:24 PM By: Regan Lemming BSN, RN Entered By: Regan Lemming on 01/07/2015 13:27:16 Bradley [name] ([id number]) -------------------------------------------------------------------------------- Pain Assessment Details Patient Name: Bradley Loa A. Date of Service: 01/07/2015 1:00 PM Medical Record Number: [id number] Patient Account Number: [id number] Date of Birth/Sex: [birth year] (69 y.o. Male) Treating RN: Baruch Gouty, RN, BSN,  Velva Harman Primary Care Physician: [name] Other Clinician: Referring Physician: [name] Treating Physician/Extender: BURNS III, Charlean Sanfilippo in Treatment: 3 Active Problems Location of Pain Severity and Description of Pain Patient Has Paino No Site Locations Pain Management and Medication Current Pain Management: Electronic Signature(s) Signed: 01/07/2015 4:33:24 PM By: Regan Lemming BSN, RN Entered By: Regan Lemming on 01/07/2015 13:17:30 Bradley [name] ([id number]) -------------------------------------------------------------------------------- Patient/Caregiver Education Details Patient Name: Bradley Loa A. Date of Service: 01/07/2015 1:00 PM Medical Record Number: [id number] Patient Account Number: [id number] Date of Birth/Gender: June 05, 1946 (69 y.o. Male) Treating RN: Baruch Gouty, RN, BSN, Velva Harman Primary Care Physician: [name] Other Clinician: Referring Physician: [name] Treating Physician/Extender: BURNS III, Charlean Sanfilippo in Treatment: 3 Education Assessment Education Provided To: Patient Education Topics Provided Pressure: Methods: Explain/Verbal Responses: State content correctly Welcome To [facility] [place]: Methods: Explain/Verbal Responses: State content correctly Electronic Signature(s) Signed: 01/07/2015 4:33:24 PM By: Regan Lemming BSN, RN Entered By: Regan Lemming on 01/07/2015 13:30:31 Bradley [name] ([id number]) -------------------------------------------------------------------------------- Wound Assessment Details Patient Name: Bradley Loa A. Date of Service: 01/07/2015 1:00 PM Medical Record Number: [id number] Patient Account  Number: [id number] Date of Birth/Sex: 22-Aug-1945 (69 y.o. Male) Treating RN: Baruch Gouty, RN, BSN, [place] Primary Care Physician: [name] Other Clinician: Referring Physician: [name] Treating Physician/Extender: BURNS III, Charlean Sanfilippo in Treatment: 3 Wound Status Wound Number: 3  Primary Pressure Ulcer Etiology: Wound Location: Sacrum - Medial Wound Status: Open Wounding Event: Gradually Appeared Comorbid Hypertension, Rheumatoid Arthritis, Date Acquired: 07/07/2014 History: Paraplegia Weeks Of Treatment: 3 Clustered Wound: No Photos Photo Uploaded By: Regan Lemming on 01/07/2015 15:30:26 Wound Measurements Length: (cm) 12 Width: (cm) 9 Depth: (cm) 0.2 Area: (cm) 84.823 Volume: (cm) 16.965 % Reduction in Area: -28.6% % Reduction in Volume: 14.2% Epithelialization: Medium (34-66%) Tunneling: No Wound Description Classification: Category/Stage III Wound Margin: Distinct, outline attached Exudate Amount: Medium Exudate Type: Serosanguineous Exudate Color: red, brown Foul Odor After Cleansing: No Wound Bed Granulation Amount: Large (67-100%) Exposed Structure Necrotic Amount: None Present (0%) Fascia Exposed: No Fat Layer Exposed: No Tendon Exposed: No Bradley [name], Bradley A. ([id number]) Muscle Exposed: No Joint Exposed: No Bone Exposed: No Limited to Skin Breakdown Periwound Skin Texture Texture Color No Abnormalities Noted: No No Abnormalities Noted: No Callus: No Atrophie Blanche: No Crepitus: No Cyanosis: No Excoriation: No Ecchymosis: No Fluctuance: No Erythema: No Friable: No Hemosiderin Staining: No Induration: No Mottled: No Localized Edema: No Pallor: No Rash: No Rubor: No Scarring: Yes Temperature / Pain Moisture Temperature: No Abnormality No Abnormalities Noted: No Dry / Scaly: No Maceration: No Moist: Yes Wound Preparation Ulcer Cleansing: Rinsed/Irrigated with Saline Topical Anesthetic Applied: None Treatment Notes Wound #3 (Medial Sacrum) 1. Cleansed with: Clean wound with Normal Saline 3. Peri-wound Care: Skin Prep 4. Dressing Applied: Aquacel Ag 5. Secondary Dressing Applied Bordered Foam Dressing Electronic Signature(s) Signed: 01/07/2015 4:33:24 PM By: Regan Lemming BSN, RN Entered By: Regan Lemming on  01/07/2015 13:22:40 Bradley [name] ([id number]) -------------------------------------------------------------------------------- Vitals Details Patient Name: Bradley Loa A. Date of Service: 01/07/2015 1:00 PM Medical Record Number: [id number] Patient Account Number: [id number] Date of Birth/Sex: 02/18/1946 (69 y.o. Male) Treating RN: Baruch Gouty, RN, BSN, [place] Primary Care Physician: [name] Other Clinician: Referring Physician: [name] Treating Physician/Extender: BURNS III, Charlean Sanfilippo in Treatment: 3 Vital Signs Time Taken: 13:14 Temperature (F): 97.7 Height (in): 73 Pulse (bpm): 77 Weight (lbs): 205 Respiratory Rate (breaths/min): 16 Body Mass Index (BMI): 27 Blood Pressure (mmHg): 119/79 Reference Range: 80 - 120 mg / dl Electronic Signature(s) Signed: 01/07/2015 4:33:24 PM By: Regan Lemming BSN, RN Entered By: Regan Lemming on 01/07/2015 13:18:12
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
## 2015-01-12 ENCOUNTER — Encounter (HOSPITAL_BASED_OUTPATIENT_CLINIC_OR_DEPARTMENT_OTHER): Payer: Medicare Other | Attending: Plastic Surgery

## 2015-01-12 DIAGNOSIS — L89322 Pressure ulcer of left buttock, stage 2: Secondary | ICD-10-CM | POA: Insufficient documentation

## 2015-01-12 DIAGNOSIS — Z87891 Personal history of nicotine dependence: Secondary | ICD-10-CM | POA: Diagnosis not present

## 2015-01-12 DIAGNOSIS — L89312 Pressure ulcer of right buttock, stage 2: Secondary | ICD-10-CM | POA: Diagnosis not present

## 2015-01-12 DIAGNOSIS — G8220 Paraplegia, unspecified: Secondary | ICD-10-CM | POA: Insufficient documentation

## 2015-01-12 DIAGNOSIS — L89152 Pressure ulcer of sacral region, stage 2: Secondary | ICD-10-CM | POA: Diagnosis not present

## 2015-01-19 DIAGNOSIS — G8220 Paraplegia, unspecified: Secondary | ICD-10-CM | POA: Diagnosis not present

## 2015-01-19 DIAGNOSIS — L89312 Pressure ulcer of right buttock, stage 2: Secondary | ICD-10-CM | POA: Diagnosis not present

## 2015-01-19 DIAGNOSIS — L89152 Pressure ulcer of sacral region, stage 2: Secondary | ICD-10-CM | POA: Diagnosis not present

## 2015-01-19 DIAGNOSIS — L89322 Pressure ulcer of left buttock, stage 2: Secondary | ICD-10-CM | POA: Diagnosis not present

## 2015-01-21 ENCOUNTER — Encounter: Payer: Medicare Other | Admitting: Surgery

## 2015-02-04 ENCOUNTER — Encounter: Payer: Medicare Other | Admitting: Surgery

## 2015-02-04 DIAGNOSIS — L89153 Pressure ulcer of sacral region, stage 3: Secondary | ICD-10-CM | POA: Diagnosis not present

## 2015-02-05 NOTE — Progress Notes (Signed)
ULISSES, VONDRAK ([id number]) Visit Report for 02/04/2015 Arrival Information Details Patient Name: Bradley [name], Bradley [name]. Date of Service: 02/04/2015 9:15 AM Medical Record Number: [id number] Patient Account Number: [id number] Date of Birth/Sex: 1945-06-29 (69 y.o. Male) Treating RN: Cornell Barman Primary Care Physician: [name] Other Clinician: Referring Physician: [name] Treating Physician/Extender: BURNS III, Charlean Sanfilippo in Treatment: 7 Visit Information History Since Last Visit Added or deleted any medications: Yes Patient Arrived: Wheel Chair Any new allergies or adverse reactions: No Arrival Time: 09:08 Had a fall or experienced change in No activities of daily living that may affect Accompanied By: self risk of falls: Transfer Assistance: Civil Service fast streamer Signs or symptoms of abuse/neglect since last No Patient Identification Verified: Yes visito Secondary Verification Process Yes Hospitalized since last visit: No Completed: Has Dressing in Place as Prescribed: Yes Patient Requires Transmission-Based No Pain Present Now: No Precautions: Patient Has Alerts: No Notes Patient arrived today in a new power chair. This chair allows him to sit, shift positions more easily and lie flat. Electronic Signature(s) Signed: 02/04/2015 5:43:22 PM By: Gretta Cool, RN, BSN, Kim RN, BSN Entered By: Gretta Cool, RN, BSN, Kim on 02/04/2015 09:22:29 [name] ([id number]) -------------------------------------------------------------------------------- Clinic Level of Care Assessment Details Patient Name: [name], [name] Date of Service: 02/04/2015 9:15 AM Medical Record Number: [id number] Patient Account Number: [id number] Date of Birth/Sex: 1945-07-16 (69 y.o. Male) Treating RN: Cornell Barman Primary Care Physician: [name] Other Clinician: Referring Physician: [name] Treating Physician/Extender: BURNS III, Charlean Sanfilippo in Treatment: 7 Clinic Level of Care  Assessment Items TOOL 4 Quantity Score []  - Use when only an EandM is performed on FOLLOW-UP visit 0 ASSESSMENTS - Nursing Assessment / Reassessment []  - Reassessment of Co-morbidities (includes updates in patient status) 0 X - Reassessment of Adherence to Treatment Plan 1 5 ASSESSMENTS - Wound and Skin Assessment / Reassessment X - Simple Wound Assessment / Reassessment - one wound 1 5 []  - Complex Wound Assessment / Reassessment - multiple wounds 0 []  - Dermatologic / Skin Assessment (not related to wound area) 0 ASSESSMENTS - Focused Assessment []  - Circumferential Edema Measurements - multi extremities 0 []  - Nutritional Assessment / Counseling / Intervention 0 []  - Lower Extremity Assessment (monofilament, tuning fork, pulses) 0 []  - Peripheral Arterial Disease Assessment (using hand held doppler) 0 ASSESSMENTS - Ostomy and/or Continence Assessment and Care []  - Incontinence Assessment and Management 0 []  - Ostomy Care Assessment and Management (repouching, etc.) 0 PROCESS - Coordination of Care X - Simple Patient / Family Education for ongoing care 1 15 []  - Complex (extensive) Patient / Family Education for ongoing care 0 X - Staff obtains Programmer, systems, Records, Test Results / Process Orders 1 10 []  - Staff telephones HHA, Nursing Homes / Clarify orders / etc 0 []  - Routine Transfer to another Facility (non-emergent condition) 0 AMISH, MINTZER ([id number]) []  - Routine Hospital Admission (non-emergent condition) 0 []  - New Admissions / Biomedical engineer / Ordering NPWT, Apligraf, etc. 0 []  - Emergency Hospital Admission (emergent condition) 0 X - Simple Discharge Coordination 1 10 []  - Complex (extensive) Discharge Coordination 0 PROCESS - Special Needs []  - Pediatric / Minor Patient Management 0 []  - Isolation Patient Management 0 []  - Hearing / Language / Visual special needs 0 []  - Assessment of Community assistance (transportation, D/C planning, etc.) 0 []  -  Additional assistance / Altered mentation 0 []  - Support Surface(s) Assessment (bed, cushion, seat, etc.) 0 INTERVENTIONS - Wound Cleansing / Measurement X -  Simple Wound Cleansing - one wound 1 5 []  - Complex Wound Cleansing - multiple wounds 0 X - Wound Imaging (photographs - any number of wounds) 1 5 []  - Wound Tracing (instead of photographs) 0 X - Simple Wound Measurement - one wound 1 5 []  - Complex Wound Measurement - multiple wounds 0 INTERVENTIONS - Wound Dressings []  - Small Wound Dressing one or multiple wounds 0 X - Medium Wound Dressing one or multiple wounds 1 15 []  - Large Wound Dressing one or multiple wounds 0 []  - Application of Medications - topical 0 []  - Application of Medications - injection 0 INTERVENTIONS - Miscellaneous []  - External ear exam 0 KEROLOS, NEHME A. ([id number]) []  - Specimen Collection (cultures, biopsies, blood, body fluids, etc.) 0 []  - Specimen(s) / Culture(s) sent or taken to Lab for analysis 0 X - Patient Transfer (multiple staff / Civil Service fast streamer / Similar devices) 1 10 []  - Simple Staple / Suture removal (25 or less) 0 []  - Complex Staple / Suture removal (26 or more) 0 []  - Hypo / Hyperglycemic Management (close monitor of Blood Glucose) 0 []  - Ankle / Brachial Index (ABI) - do not check if billed separately 0 X - Vital Signs 1 5 Has the patient been seen at the hospital within the last three years: Yes Total Score: 90 Level Of Care: New/Established - Level 3 Electronic Signature(s) Signed: 02/04/2015 5:43:22 PM By: Gretta Cool, RN, BSN, Kim RN, BSN Entered By: Gretta Cool, RN, BSN, Kim on 02/04/2015 09:47:23 [name] ([id number]) -------------------------------------------------------------------------------- Encounter Discharge Information Details Patient Name: Bradley Loa A. Date of Service: 02/04/2015 9:15 AM Medical Record Number: [id number] Patient Account Number: [id number] Date of Birth/Sex: 17-Jan-1946 (69 y.o. Male) Treating RN: Baruch Gouty,  RN, BSN, Velva Harman Primary Care Physician: [name] Other Clinician: Referring Physician: [name] Treating Physician/Extender: BURNS III, Charlean Sanfilippo in Treatment: 7 Encounter Discharge Information Items Discharge Pain Level: 0 Discharge Condition: Stable Ambulatory Status: Wheelchair Discharge Destination: Home Transportation: Private Auto Accompanied By: self Schedule Follow-up Appointment: Yes Medication Reconciliation completed and provided to Patient/Care Yes Avrielle Fry: Provided on Clinical Summary of Care: 02/04/2015 Form Type Recipient Paper Patient JG Electronic Signature(s) Signed: 02/04/2015 5:43:22 PM By: Gretta Cool RN, BSN, Kim RN, BSN Previous Signature: 02/04/2015 9:40:56 AM Version By: Ruthine Dose Entered By: Gretta Cool RN, BSN, Kim on 02/04/2015 09:48:34 [name] ([id number]) -------------------------------------------------------------------------------- Multi Wound Chart Details Patient Name: Bradley Loa A. Date of Service: 02/04/2015 9:15 AM Medical Record Number: [id number] Patient Account Number: [id number] Date of Birth/Sex: Sep 23, 1945 (69 y.o. Male) Treating RN: Cornell Barman Primary Care Physician: [name] Other Clinician: Referring Physician: [name] Treating Physician/Extender: BURNS III, Charlean Sanfilippo in Treatment: 7 Vital Signs Height(in): 73 Pulse(bpm): 75 Weight(lbs): 205 Blood Pressure 115/59 (mmHg): Body Mass Index(BMI): 27 Temperature(F): 97.6 Respiratory Rate 18 (breaths/min): Photos: [3:No Photos] [N/A:N/A] Wound Location: [3:Sacrum - Medial] [N/A:N/A] Wounding Event: [3:Gradually Appeared] [N/A:N/A] Primary Etiology: [3:Pressure Ulcer] [N/A:N/A] Comorbid History: [3:Hypertension, Rheumatoid Arthritis, Paraplegia] [N/A:N/A] Date Acquired: [3:07/07/2014] [N/A:N/A] Weeks of Treatment: [3:7] [N/A:N/A] Wound Status: [3:Open] [N/A:N/A] Measurements L x W x D 9.9x4x0.2 [N/A:N/A] (cm) Area (cm) :  [3:31.102] [N/A:N/A] Volume (cm) : [3:6.22] [N/A:N/A] % Reduction in Area: [3:52.80%] [N/A:N/A] % Reduction in Volume: 68.60% [N/A:N/A] Classification: [3:Category/Stage III] [N/A:N/A] Exudate Amount: [3:Medium] [N/A:N/A] Exudate Type: [3:Serosanguineous] [N/A:N/A] Exudate Color: [3:red, brown] [N/A:N/A] Wound Margin: [3:Distinct, outline attached] [N/A:N/A] Granulation Amount: [3:Large (67-100%)] [N/A:N/A] Necrotic Amount: [3:None Present (0%)] [N/A:N/A] Exposed Structures: [3:Fascia: No Fat: No Tendon: No Muscle: No Joint: No  Bone: No] [N/A:N/A] Limited to Skin Breakdown Epithelialization: Medium (34-66%) N/A N/A Periwound Skin Texture: Scarring: Yes N/A N/A Edema: No Excoriation: No Induration: No Callus: No Crepitus: No Fluctuance: No Friable: No Rash: No Periwound Skin Maceration: Yes N/A N/A Moisture: Moist: Yes Dry/Scaly: No Periwound Skin Color: Atrophie Blanche: No N/A N/A Cyanosis: No Ecchymosis: No Erythema: No Hemosiderin Staining: No Mottled: No Pallor: No Rubor: No Temperature: No Abnormality N/A N/A Tenderness on No N/A N/A Palpation: Wound Preparation: Ulcer Cleansing: N/A N/A Rinsed/Irrigated with Saline Topical Anesthetic Applied: None Treatment Notes Electronic Signature(s) Signed: 02/04/2015 5:43:22 PM By: Gretta Cool, RN, BSN, Kim RN, BSN Entered By: Gretta Cool, RN, BSN, Kim on 02/04/2015 09:41:28 [name] ([id number]) -------------------------------------------------------------------------------- [place] Details Patient Name: MICHELLE, VANHISE A. Date of Service: 02/04/2015 9:15 AM Medical Record Number: [id number] Patient Account Number: [id number] Date of Birth/Sex: 1945/10/21 (69 y.o. Male) Treating RN: Cornell Barman Primary Care Physician: [name] Other Clinician: Referring Physician: [name] Treating Physician/Extender: BURNS III, Charlean Sanfilippo in Treatment: 7 Active Inactive Orientation to the  Wound Care Program Nursing Diagnoses: Knowledge deficit related to the wound healing center program Goals: Patient/caregiver will verbalize understanding of the [facility] Date Initiated: 12/17/2014 Goal Status: Active Interventions: Provide education on orientation to the wound center Notes: Pressure Nursing Diagnoses: Knowledge deficit related to management of pressures ulcers Potential for impaired tissue integrity related to pressure, friction, moisture, and shear Goals: Patient will remain free from development of additional pressure ulcers Date Initiated: 12/17/2014 Goal Status: Active Interventions: Assess: immobility, friction, shearing, incontinence upon admission and as needed Provide education on pressure ulcers Notes: Wound/Skin Impairment Nursing Diagnoses: Impaired tissue integrity GoalsORELL, HURTADO ([id number]) Ulcer/skin breakdown will have a volume reduction of 30% by week 4 Date Initiated: 12/17/2014 Goal Status: Active Interventions: Assess ulceration(s) every visit Notes: Electronic Signature(s) Signed: 02/04/2015 5:43:22 PM By: Gretta Cool, RN, BSN, Kim RN, BSN Entered By: Gretta Cool, RN, BSN, Kim on 02/04/2015 09:41:20 [name] ([id number]) -------------------------------------------------------------------------------- Pain Assessment Details Patient Name: Bradley Loa A. Date of Service: 02/04/2015 9:15 AM Medical Record Number: [id number] Patient Account Number: [id number] Date of Birth/Sex: November 20, 1945 (69 y.o. Male) Treating RN: Cornell Barman Primary Care Physician: [name] Other Clinician: Referring Physician: [name] Treating Physician/Extender: BURNS III, Charlean Sanfilippo in Treatment: 7 Active Problems Location of Pain Severity and Description of Pain Patient Has Paino No Site Locations Pain Management and Medication Current Pain Management: Electronic Signature(s) Signed: 02/04/2015 5:43:22 PM By: Gretta Cool,  RN, BSN, Kim RN, BSN Entered By: Gretta Cool, RN, BSN, Kim on 02/04/2015 09:09:30 [name] ([id number]) -------------------------------------------------------------------------------- Patient/Caregiver Education Details Patient Name: Bradley Loa A. Date of Service: 02/04/2015 9:15 AM Medical Record Number: [id number] Patient Account Number: [id number] Date of Birth/Gender: 1945-12-15 (69 y.o. Male) Treating RN: Cornell Barman Primary Care Physician: [name] Other Clinician: Referring Physician: [name] Treating Physician/Extender: BURNS III, Charlean Sanfilippo in Treatment: 7 Education Assessment Education Provided To: Patient Education Topics Provided Pressure: Handouts: Pressure Ulcers: Care and Offloading, Other: turn and reposition every 2 hours Methods: Demonstration, Explain/Verbal Responses: State content correctly Wound/Skin Impairment: Handouts: Caring for Your Ulcer, Other: continue wound care as prescribed Methods: Demonstration Responses: State content correctly Electronic Signature(s) Signed: 02/04/2015 5:43:22 PM By: Gretta Cool, RN, BSN, Kim RN, BSN Entered By: Gretta Cool, RN, BSN, Kim on 02/04/2015 09:49:17 [name] ([id number]) -------------------------------------------------------------------------------- Wound Assessment Details Patient Name: Bradley Loa A. Date of Service: 02/04/2015 9:15 AM Medical Record Number: [id number] Patient Account Number: [id number] Date of  Birth/Sex: 1946-01-20 (69 y.o. Male) Treating RN: Cornell Barman Primary Care Physician: [name] Other Clinician: Referring Physician: [name] Treating Physician/Extender: BURNS III, WALTER Weeks in Treatment: 7 Wound Status Wound Number: 3 Primary Pressure Ulcer Etiology: Wound Location: Sacrum - Medial Wound Status: Open Wounding Event: Gradually Appeared Comorbid Hypertension, Rheumatoid Arthritis, Date Acquired: 07/07/2014 History: Paraplegia Weeks Of  Treatment: 7 Clustered Wound: No Photos Photo Uploaded By: Gretta Cool, RN, BSN, Kim on 02/04/2015 10:56:35 Wound Measurements Length: (cm) 9.9 Width: (cm) 4 Depth: (cm) 0.2 Area: (cm) 31.102 Volume: (cm) 6.22 % Reduction in Area: 52.8% % Reduction in Volume: 68.6% Epithelialization: Medium (34-66%) Tunneling: No Undermining: No Wound Description Classification: Category/Stage III Wound Margin: Distinct, outline attached Exudate Amount: Medium Exudate Type: Serosanguineous Exudate Color: red, brown Foul Odor After Cleansing: No Wound Bed Granulation Amount: Large (67-100%) Exposed Structure Necrotic Amount: None Present (0%) Fascia Exposed: No Fat Layer Exposed: No Tendon Exposed: No HAMP, MORELAND A. ([id number]) Muscle Exposed: No Joint Exposed: No Bone Exposed: No Limited to Skin Breakdown Periwound Skin Texture Texture Color No Abnormalities Noted: No No Abnormalities Noted: No Callus: No Atrophie Blanche: No Crepitus: No Cyanosis: No Excoriation: No Ecchymosis: No Fluctuance: No Erythema: No Friable: No Hemosiderin Staining: No Induration: No Mottled: No Localized Edema: No Pallor: No Rash: No Rubor: No Scarring: Yes Temperature / Pain Moisture Temperature: No Abnormality No Abnormalities Noted: No Dry / Scaly: No Maceration: Yes Moist: Yes Wound Preparation Ulcer Cleansing: Rinsed/Irrigated with Saline Topical Anesthetic Applied: None Treatment Notes Wound #3 (Medial Sacrum) 1. Cleansed with: Cleanse wound with antibacterial soap and water 2. Anesthetic Topical Lidocaine 4% cream to wound bed prior to debridement 3. Peri-wound Care: Skin Prep 4. Dressing Applied: Aquacel Ag 5. Secondary Dressing Applied Bordered Foam Dressing Electronic Signature(s) Signed: 02/04/2015 5:43:22 PM By: Gretta Cool, RN, BSN, Kim RN, BSN Entered By: Gretta Cool, RN, BSN, Kim on 02/04/2015 09:17:25 [name]  ([id number]) -------------------------------------------------------------------------------- [place] Details Patient Name: Bradley Loa A. Date of Service: 02/04/2015 9:15 AM Medical Record Number: [id number] Patient Account Number: [id number] Date of Birth/Sex: [birth year] (69 y.o. Male) Treating RN: Cornell Barman Primary Care Physician: [name] Other Clinician: Referring Physician: [name] Treating Physician/Extender: BURNS III, Charlean Sanfilippo in Treatment: 7 Vital Signs Time Taken: 09:11 Temperature (F): 97.6 Height (in): 73 Pulse (bpm): 75 Weight (lbs): 205 Respiratory Rate (breaths/min): 18 Body Mass Index (BMI): 27 Blood Pressure (mmHg): 115/59 Reference Range: 80 - 120 mg / dl Electronic Signature(s) Signed: 02/04/2015 5:43:22 PM By: Gretta Cool, RN, BSN, Kim RN, BSN Entered By: Gretta Cool, RN, BSN, Kim on 02/04/2015 09:11:37
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
## 2015-02-16 ENCOUNTER — Encounter (HOSPITAL_BASED_OUTPATIENT_CLINIC_OR_DEPARTMENT_OTHER): Payer: Medicare Other | Attending: Plastic Surgery

## 2015-02-18 ENCOUNTER — Encounter: Payer: Medicare Other | Attending: Surgery | Admitting: Surgery

## 2015-02-18 DIAGNOSIS — G8220 Paraplegia, unspecified: Secondary | ICD-10-CM | POA: Diagnosis not present

## 2015-02-18 DIAGNOSIS — L89153 Pressure ulcer of sacral region, stage 3: Secondary | ICD-10-CM | POA: Insufficient documentation

## 2015-02-18 DIAGNOSIS — L89313 Pressure ulcer of right buttock, stage 3: Secondary | ICD-10-CM | POA: Insufficient documentation

## 2015-02-18 DIAGNOSIS — L89323 Pressure ulcer of left buttock, stage 3: Secondary | ICD-10-CM | POA: Diagnosis not present
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
## 2015-02-19 NOTE — Progress Notes (Signed)
GRANT, SWAGER ([id number]) Visit Report for 02/18/2015 Arrival Information Details Patient Name: Bradley [name], Bradley [name]. Date of Service: 02/18/2015 9:15 AM Medical Record Number: [id number] Patient Account Number: [id number] Date of Birth/Sex: September 13, 1945 (69 y.o. Male) Treating RN: Montey Hora Primary Care Physician: [name] Other Clinician: Referring Physician: [name] Treating Physician/Extender: BURNS III, Charlean Sanfilippo in Treatment: 9 Visit Information History Since Last Visit Added or deleted any medications: No Patient Arrived: Wheel Chair Any new allergies or adverse reactions: No Arrival Time: 09:40 Had a fall or experienced change in No activities of daily living that may affect Accompanied By: sef risk of falls: Transfer Assistance: Hoyer Lift Signs or symptoms of abuse/neglect since last No Patient Identification Verified: Yes visito Secondary Verification Process Yes Hospitalized since last visit: No Completed: Pain Present Now: No Patient Requires Transmission-Based No Precautions: Patient Has Alerts: No Electronic Signature(s) Signed: 02/18/2015 5:43:43 PM By: Montey Hora Entered By: Montey Hora on 02/18/2015 09:49:40 Bradley [name] ([id number]) -------------------------------------------------------------------------------- Encounter Discharge Information Details Patient Name: Bradley Loa A. Date of Service: 02/18/2015 9:15 AM Medical Record Number: [id number] Patient Account Number: [id number] Date of Birth/Sex: 26-Nov-1945 (69 y.o. Male) Treating RN: Montey Hora Primary Care Physician: [name] Other Clinician: Referring Physician: [name] Treating Physician/Extender: BURNS III, Charlean Sanfilippo in Treatment: 9 Encounter Discharge Information Items Discharge Pain Level: 0 Discharge Condition: Stable Ambulatory Status: Wheelchair Discharge Destination: Home Transportation: Private Auto Accompanied By:  self Schedule Follow-up Appointment: Yes Medication Reconciliation completed and provided to Patient/Care No Martell Mcfadyen: Provided on Clinical Summary of Care: 02/18/2015 Form Type Recipient Paper Patient JG Electronic Signature(s) Signed: 02/18/2015 10:43:51 AM By: Montey Hora Previous Signature: 02/18/2015 10:30:38 AM Version By: Ruthine Dose Entered By: Montey Hora on 02/18/2015 10:43:51 Bradley [name] ([id number]) -------------------------------------------------------------------------------- Multi Wound Chart Details Patient Name: Bradley Loa A. Date of Service: 02/18/2015 9:15 AM Medical Record Number: [id number] Patient Account Number: [id number] Date of Birth/Sex: 14-Feb-1946 (69 y.o. Male) Treating RN: Montey Hora Primary Care Physician: [name] Other Clinician: Referring Physician: [name] Treating Physician/Extender: BURNS III, Charlean Sanfilippo in Treatment: 9 Vital Signs Height(in): 73 Pulse(bpm): 64 Weight(lbs): 205 Blood Pressure 143/66 (mmHg): Body Mass Index(BMI): 27 Temperature(F): 97.8 Respiratory Rate 18 (breaths/min): Photos: [3:No Photos] [N/A:N/A] Wound Location: [3:Sacrum - Medial] [N/A:N/A] Wounding Event: [3:Gradually Appeared] [N/A:N/A] Primary Etiology: [3:Pressure Ulcer] [N/A:N/A] Comorbid History: [3:Hypertension, Rheumatoid Arthritis, Paraplegia] [N/A:N/A] Date Acquired: [3:07/07/2014] [N/A:N/A] Weeks of Treatment: [3:9] [N/A:N/A] Wound Status: [3:Open] [N/A:N/A] Measurements L x W x D 8x3x0.3 [N/A:N/A] (cm) Area (cm) : [3:18.85] [N/A:N/A] Volume (cm) : [3:5.655] [N/A:N/A] % Reduction in Area: [3:71.40%] [N/A:N/A] % Reduction in Volume: 71.40% [N/A:N/A] Classification: [3:Category/Stage III] [N/A:N/A] Exudate Amount: [3:Medium] [N/A:N/A] Exudate Type: [3:Serosanguineous] [N/A:N/A] Exudate Color: [3:red, brown] [N/A:N/A] Wound Margin: [3:Distinct, outline attached] [N/A:N/A] Granulation Amount: [3:Large  (67-100%)] [N/A:N/A] Necrotic Amount: [3:None Present (0%)] [N/A:N/A] Exposed Structures: [3:Fascia: No Fat: No Tendon: No Muscle: No Joint: No Bone: No] [N/A:N/A] Limited to Skin Breakdown Epithelialization: Medium (34-66%) N/A N/A Periwound Skin Texture: Scarring: Yes N/A N/A Edema: No Excoriation: No Induration: No Callus: No Crepitus: No Fluctuance: No Friable: No Rash: No Periwound Skin Maceration: Yes N/A N/A Moisture: Moist: Yes Dry/Scaly: No Periwound Skin Color: Atrophie Blanche: No N/A N/A Cyanosis: No Ecchymosis: No Erythema: No Hemosiderin Staining: No Mottled: No Pallor: No Rubor: No Temperature: No Abnormality N/A N/A Tenderness on No N/A N/A Palpation: Wound Preparation: Ulcer Cleansing: N/A N/A Rinsed/Irrigated with Saline Topical Anesthetic Applied: Other: lidocaine 4% Treatment Notes Electronic Signature(s) Signed: 02/18/2015 5:43:43 PM  By: Montey Hora Entered By: Montey Hora on 02/18/2015 10:01:41 Bradley [name] ([id number]) -------------------------------------------------------------------------------- [place] Details Patient Name: [name], [name] Date of Service: 02/18/2015 9:15 AM Medical Record Number: [id number] Patient Account Number: [id number] Date of Birth/Sex: November 09, 1945 (69 y.o. Male) Treating RN: Montey Hora Primary Care Physician: [name] Other Clinician: Referring Physician: [name] Treating Physician/Extender: BURNS III, Charlean Sanfilippo in Treatment: 9 Active Inactive Orientation to the Wound Care Program Nursing Diagnoses: Knowledge deficit related to the wound healing center program Goals: Patient/caregiver will verbalize understanding of the [place] [facility] Date Initiated: 12/17/2014 Goal Status: Active Interventions: Provide education on orientation to the wound center Notes: Pressure Nursing Diagnoses: Knowledge deficit related to management of  pressures ulcers Potential for impaired tissue integrity related to pressure, friction, moisture, and shear Goals: Patient will remain free from development of additional pressure ulcers Date Initiated: 12/17/2014 Goal Status: Active Interventions: Assess: immobility, friction, shearing, incontinence upon admission and as needed Provide education on pressure ulcers Notes: Wound/Skin Impairment Nursing Diagnoses: Impaired tissue integrity GoalsJOSEMIGUEL, GRIES ([id number]) Ulcer/skin breakdown will have a volume reduction of 30% by week 4 Date Initiated: 12/17/2014 Goal Status: Active Interventions: Assess ulceration(s) every visit Notes: Electronic Signature(s) Signed: 02/18/2015 5:43:43 PM By: Montey Hora Entered By: Montey Hora on 02/18/2015 10:01:32 Bradley [name] ([id number]) -------------------------------------------------------------------------------- Patient/Caregiver Education Details Patient Name: Bradley Loa A. Date of Service: 02/18/2015 9:15 AM Medical Record Number: [id number] Patient Account Number: [id number] Date of Birth/Gender: 1945-11-02 (69 y.o. Male) Treating RN: Montey Hora Primary Care Physician: [name] Other Clinician: Referring Physician: [name] Treating Physician/Extender: BURNS III, Charlean Sanfilippo in Treatment: 9 Education Assessment Education Provided To: Patient Education Topics Provided Pressure: Handouts: Other: no sitting for more than 2 hours with repositioning even while sitting for 2 hours Methods: Explain/Verbal Responses: State content correctly Electronic Signature(s) Signed: 02/18/2015 10:44:26 AM By: Montey Hora Entered By: Montey Hora on 02/18/2015 10:44:26 Bradley [name] ([id number]) -------------------------------------------------------------------------------- Wound Assessment Details Patient Name: Bradley Loa A. Date of Service: 02/18/2015 9:15 AM Medical Record Number:  [id number] Patient Account Number: [id number] Date of Birth/Sex: 1946/02/07 (69 y.o. Male) Treating RN: Montey Hora Primary Care Physician: [name] Other Clinician: Referring Physician: [name] Treating Physician/Extender: BURNS III, Charlean Sanfilippo in Treatment: 9 Wound Status Wound Number: 3 Primary Pressure Ulcer Etiology: Wound Location: Sacrum - Medial Wound Status: Open Wounding Event: Gradually Appeared Comorbid Hypertension, Rheumatoid Arthritis, Date Acquired: 07/07/2014 History: Paraplegia Weeks Of Treatment: 9 Clustered Wound: No Photos Photo Uploaded By: Montey Hora on 02/18/2015 12:29:50 Wound Measurements Length: (cm) 8 Width: (cm) 3 Depth: (cm) 0.3 Area: (cm) 18.85 Volume: (cm) 5.655 % Reduction in Area: 71.4% % Reduction in Volume: 71.4% Epithelialization: Medium (34-66%) Tunneling: No Undermining: No Wound Description Classification: Category/Stage III Wound Margin: Distinct, outline attached Exudate Amount: Medium Exudate Type: Serosanguineous Exudate Color: red, brown Foul Odor After Cleansing: No Wound Bed Granulation Amount: Large (67-100%) Exposed Structure Necrotic Amount: None Present (0%) Fascia Exposed: No Fat Layer Exposed: No Tendon Exposed: No MACKEY, VARRICCHIO A. ([id number]) Muscle Exposed: No Joint Exposed: No Bone Exposed: No Limited to Skin Breakdown Periwound Skin Texture Texture Color No Abnormalities Noted: No No Abnormalities Noted: No Callus: No Atrophie Blanche: No Crepitus: No Cyanosis: No Excoriation: No Ecchymosis: No Fluctuance: No Erythema: No Friable: No Hemosiderin Staining: No Induration: No Mottled: No Localized Edema: No Pallor: No Rash: No Rubor: No Scarring: Yes Temperature / Pain Moisture Temperature: No Abnormality No Abnormalities Noted: No Dry / Scaly:  No Maceration: Yes Moist: Yes Wound Preparation Ulcer Cleansing: Rinsed/Irrigated with Saline Topical Anesthetic  Applied: Other: lidocaine 4%, Treatment Notes Wound #3 (Medial Sacrum) 1. Cleansed with: Clean wound with Normal Saline 2. Anesthetic Topical Lidocaine 4% cream to wound bed prior to debridement 4. Dressing Applied: Aquacel Ag 5. Secondary Dressing Applied Bordered Foam Dressing Electronic Signature(s) Signed: 02/18/2015 5:43:43 PM By: Montey Hora Entered By: Montey Hora on 02/18/2015 10:01:24 Bradley [name] ([id number]) -------------------------------------------------------------------------------- [place] Details Patient Name: Bradley Loa A. Date of Service: 02/18/2015 9:15 AM Medical Record Number: [id number] Patient Account Number: [id number] Date of Birth/Sex: 1946-01-03 (69 y.o. Male) Treating RN: Montey Hora Primary Care Physician: [name] Other Clinician: Referring Physician: [name] Treating Physician/Extender: BURNS III, Charlean Sanfilippo in Treatment: 9 Vital Signs Time Taken: 09:49 Temperature (F): 97.8 Height (in): 73 Pulse (bpm): 64 Weight (lbs): 205 Respiratory Rate (breaths/min): 18 Body Mass Index (BMI): 27 Blood Pressure (mmHg): 143/66 Reference Range: 80 - 120 mg / dl Electronic Signature(s) Signed: 02/18/2015 5:43:43 PM By: Montey Hora Entered By: Montey Hora on 02/18/2015 09:50:08

## 2015-03-10 DIAGNOSIS — L89153 Pressure ulcer of sacral region, stage 3: Secondary | ICD-10-CM | POA: Diagnosis not present

## 2015-03-10 DIAGNOSIS — I10 Essential (primary) hypertension: Secondary | ICD-10-CM | POA: Diagnosis not present

## 2015-03-10 DIAGNOSIS — D649 Anemia, unspecified: Secondary | ICD-10-CM | POA: Diagnosis not present

## 2015-03-10 DIAGNOSIS — G8220 Paraplegia, unspecified: Secondary | ICD-10-CM | POA: Diagnosis not present

## 2015-03-10 DIAGNOSIS — Z87440 Personal history of urinary (tract) infections: Secondary | ICD-10-CM | POA: Diagnosis not present

## 2015-03-10 DIAGNOSIS — E785 Hyperlipidemia, unspecified: Secondary | ICD-10-CM | POA: Diagnosis not present

## 2015-03-11 ENCOUNTER — Encounter: Payer: Commercial Managed Care - HMO | Attending: Surgery | Admitting: Surgery

## 2015-03-11 DIAGNOSIS — L89323 Pressure ulcer of left buttock, stage 3: Secondary | ICD-10-CM | POA: Insufficient documentation

## 2015-03-11 DIAGNOSIS — L89313 Pressure ulcer of right buttock, stage 3: Secondary | ICD-10-CM | POA: Insufficient documentation

## 2015-03-11 DIAGNOSIS — L89153 Pressure ulcer of sacral region, stage 3: Secondary | ICD-10-CM | POA: Insufficient documentation

## 2015-03-11 DIAGNOSIS — G8220 Paraplegia, unspecified: Secondary | ICD-10-CM | POA: Diagnosis not present

## 2015-03-12 DIAGNOSIS — E785 Hyperlipidemia, unspecified: Secondary | ICD-10-CM | POA: Diagnosis not present

## 2015-03-12 DIAGNOSIS — I10 Essential (primary) hypertension: Secondary | ICD-10-CM | POA: Diagnosis not present

## 2015-03-12 DIAGNOSIS — G8220 Paraplegia, unspecified: Secondary | ICD-10-CM | POA: Diagnosis not present

## 2015-03-12 DIAGNOSIS — Z87440 Personal history of urinary (tract) infections: Secondary | ICD-10-CM | POA: Diagnosis not present

## 2015-03-12 DIAGNOSIS — D649 Anemia, unspecified: Secondary | ICD-10-CM | POA: Diagnosis not present

## 2015-03-12 DIAGNOSIS — L89153 Pressure ulcer of sacral region, stage 3: Secondary | ICD-10-CM | POA: Diagnosis not present

## 2015-03-12 NOTE — Progress Notes (Signed)
REUBIN, BUSHNELL ([id number]) Visit Report for 03/11/2015 Arrival Information Details Patient Name: Bradley [name], RON. Date of Service: 03/11/2015 9:30 AM Medical Record Number: [id number] Patient Account Number: [id number] Date of Birth/Sex: [birth year] (68 y.o. Male) Treating RN: Baruch Gouty, RN, BSN, Velva Harman Primary Care Physician: [name] Other Clinician: Referring Physician: [name] Treating Physician/Extender: BURNS III, Charlean Sanfilippo in Treatment: 12 Visit Information History Since Last Visit Any new allergies or adverse reactions: No Patient Arrived: Wheel Chair Had a fall or experienced change in No activities of daily living that may affect Arrival Time: 10:09 risk of falls: Accompanied By: self Signs or symptoms of abuse/neglect since last No Transfer Assistance: [place] Patient Identification Verified: Yes Hospitalized since last visit: No Secondary Verification Process Yes Has Dressing in Place as Prescribed: Yes Completed: Pain Present Now: No Patient Requires Transmission-Based No Precautions: Patient Has Alerts: No Electronic Signature(s) Signed: 03/11/2015 4:05:44 PM By: Regan Lemming BSN, RN Entered By: Regan Lemming on 03/11/2015 10:10:05 Bradley [name] ([id number]) -------------------------------------------------------------------------------- Encounter Discharge Information Details Patient Name: Bradley Loa A. Date of Service: 03/11/2015 9:30 AM Medical Record Number: [id number] Patient Account Number: [id number] Date of Birth/Sex: 30-Sep-1945 (69 y.o. Male) Treating RN: Baruch Gouty, RN, BSN, Velva Harman Primary Care Physician: [name] Other Clinician: Referring Physician: [name] Treating Physician/Extender: BURNS III, Charlean Sanfilippo in Treatment: 12 Encounter Discharge Information Items Discharge Pain Level: 0 Discharge Condition: Stable Ambulatory Status: Wheelchair Discharge Destination: Home Transportation: Private  Auto Accompanied By: self Schedule Follow-up Appointment: Yes Medication Reconciliation completed and provided to Patient/Care Yes Bradley [name]: Provided on Clinical Summary of Care: 03/11/2015 Form Type Recipient Paper Patient JG Electronic Signature(s) Signed: 03/11/2015 4:54:35 PM By: Gretta Cool RN, BSN, Kim RN, BSN Previous Signature: 03/11/2015 7:32:55 AM Version By: Ruthine Dose Entered By: Gretta Cool RN, BSN, Kim on 03/11/2015 10:35:22 Bradley [name] ([id number]) -------------------------------------------------------------------------------- Lower Extremity Assessment Details Patient Name: Bradley [name], Bradley A. Date of Service: 03/11/2015 9:30 AM Medical Record Number: [id number] Patient Account Number: [id number] Date of Birth/Sex: 1945-12-10 (69 y.o. Male) Treating RN: Baruch Gouty, RN, BSN, Velva Harman Primary Care Physician: [name] Other Clinician: Referring Physician: [name] Treating Physician/Extender: BURNS III, Charlean Sanfilippo in Treatment: 12 Electronic Signature(s) Signed: 03/11/2015 4:05:44 PM By: Regan Lemming BSN, RN Entered By: Regan Lemming on 03/11/2015 10:11:26 Bradley [name] ([id number]) -------------------------------------------------------------------------------- Multi Wound Chart Details Patient Name: Bradley Loa A. Date of Service: 03/11/2015 9:30 AM Medical Record Number: [id number] Patient Account Number: [id number] Date of Birth/Sex: 05/16/1946 (69 y.o. Male) Treating RN: Cornell Barman Primary Care Physician: [name] Other Clinician: Referring Physician: [name] Treating Physician/Extender: BURNS III, Charlean Sanfilippo in Treatment: 12 Vital Signs Height(in): 73 Pulse(bpm): 87 Weight(lbs): 205 Blood Pressure 136/118 (mmHg): Body Mass Index(BMI): 27 Temperature(F): 97.8 Respiratory Rate 18 (breaths/min): Photos: [3:No Photos] [N/A:N/A] Wound Location: [3:Sacrum - Medial] [N/A:N/A] Wounding Event: [3:Gradually Appeared]  [N/A:N/A] Primary Etiology: [3:Pressure Ulcer] [N/A:N/A] Comorbid History: [3:Hypertension, Rheumatoid Arthritis, Paraplegia] [N/A:N/A] Date Acquired: [3:07/07/2014] [N/A:N/A] Weeks of Treatment: [3:12] [N/A:N/A] Wound Status: [3:Open] [N/A:N/A] Measurements L x W x D 8.5x3x0.2 [N/A:N/A] (cm) Area (cm) : [3:20.028] [N/A:N/A] Volume (cm) : [3:4.006] [N/A:N/A] % Reduction in Area: [3:69.60%] [N/A:N/A] % Reduction in Volume: 79.70% [N/A:N/A] Classification: [3:Category/Stage III] [N/A:N/A] Exudate Amount: [3:Medium] [N/A:N/A] Exudate Type: [3:Serosanguineous] [N/A:N/A] Exudate Color: [3:red, brown] [N/A:N/A] Wound Margin: [3:Distinct, outline attached] [N/A:N/A] Granulation Amount: [3:Large (67-100%)] [N/A:N/A] Necrotic Amount: [3:None Present (0%)] [N/A:N/A] Exposed Structures: [3:Fascia: No Fat: No Tendon: No Muscle: No Joint: No Bone: No] [N/A:N/A] Limited to Skin Breakdown Epithelialization: Medium (34-66%) N/A  N/A Debridement: Debridement ([id number]- N/A N/A 11047) Time-Out Taken: Yes N/A N/A Pain Control: Other N/A N/A Tissue Debrided: Fibrin/Slough, Exudates, N/A N/A Subcutaneous Level: Skin/Subcutaneous N/A N/A Tissue Debridement Area (sq 25.5 N/A N/A cm): Instrument: Curette N/A N/A Bleeding: Moderate N/A N/A Hemostasis Achieved: Pressure N/A N/A Procedural Pain: 0 N/A N/A Post Procedural Pain: 0 N/A N/A Debridement Treatment Procedure was tolerated N/A N/A Response: well Post Debridement 8.5x3x0.2 N/A N/A Measurements L x W x D (cm) Post Debridement 4.006 N/A N/A Volume: (cm) Post Debridement Category/Stage III N/A N/A Stage: Periwound Skin Texture: Scarring: Yes N/A N/A Edema: No Excoriation: No Induration: No Callus: No Crepitus: No Fluctuance: No Friable: No Rash: No Periwound Skin Maceration: Yes N/A N/A Moisture: Moist: Yes Dry/Scaly: No Periwound Skin Color: Atrophie Blanche: No N/A N/A Cyanosis: No Ecchymosis: No Erythema: No Hemosiderin  Staining: No Mottled: No Pallor: No Rubor: No Temperature: No Abnormality N/A N/A Tenderness on No N/A N/A Palpation: Bradley [name], Bradley [name] ([id number]) Wound Preparation: Ulcer Cleansing: N/A N/A Rinsed/Irrigated with Saline Topical Anesthetic Applied: Other: lidocaine 4% Procedures Performed: Debridement N/A N/A Treatment Notes Electronic Signature(s) Signed: 03/11/2015 4:54:35 PM By: Gretta Cool, RN, BSN, Kim RN, BSN Entered By: Gretta Cool, RN, BSN, Kim on 03/11/2015 10:23:32 Bradley [name] ([id number]) -------------------------------------------------------------------------------- [place] Details Patient Name: Bradley [name], Bradley A. Date of Service: 03/11/2015 9:30 AM Medical Record Number: [id number] Patient Account Number: [id number] Date of Birth/Sex: 11-Mar-1946 (69 y.o. Male) Treating RN: Cornell Barman Primary Care Physician: [name] Other Clinician: Referring Physician: [name] Treating Physician/Extender: BURNS III, Charlean Sanfilippo in Treatment: 12 Active Inactive Orientation to the Wound Care Program Nursing Diagnoses: Knowledge deficit related to the wound healing center program Goals: Patient/caregiver will verbalize understanding of the [place] Program Date Initiated: 12/17/2014 Goal Status: Active Interventions: Provide education on orientation to the wound center Notes: Pressure Nursing Diagnoses: Knowledge deficit related to management of pressures ulcers Potential for impaired tissue integrity related to pressure, friction, moisture, and shear Goals: Patient will remain free from development of additional pressure ulcers Date Initiated: 12/17/2014 Goal Status: Active Interventions: Assess: immobility, friction, shearing, incontinence upon admission and as needed Provide education on pressure ulcers Notes: Wound/Skin Impairment Nursing Diagnoses: Impaired tissue integrity GoalsKELTIN, Bradley [name]  ([id number]) Ulcer/skin breakdown will have a volume reduction of 30% by week 4 Date Initiated: 12/17/2014 Goal Status: Active Interventions: Assess ulceration(s) every visit Notes: Electronic Signature(s) Signed: 03/11/2015 4:54:35 PM By: Gretta Cool, RN, BSN, Kim RN, BSN Entered By: Gretta Cool, RN, BSN, Kim on 03/11/2015 10:21:57 Bradley [name] ([id number]) -------------------------------------------------------------------------------- Pain Assessment Details Patient Name: Bradley Loa A. Date of Service: 03/11/2015 9:30 AM Medical Record Number: [id number] Patient Account Number: [id number] Date of Birth/Sex: 1946-05-16 (69 y.o. Male) Treating RN: Baruch Gouty, RN, BSN, Velva Harman Primary Care Physician: [name] Other Clinician: Referring Physician: [name] Treating Physician/Extender: BURNS III, Charlean Sanfilippo in Treatment: 12 Active Problems Location of Pain Severity and Description of Pain Patient Has Paino No Site Locations Pain Management and Medication Current Pain Management: Electronic Signature(s) Signed: 03/11/2015 4:05:44 PM By: Regan Lemming BSN, RN Entered By: Regan Lemming on 03/11/2015 10:10:11 Bradley [name] ([id number]) -------------------------------------------------------------------------------- Patient/Caregiver Education Details Patient Name: Bradley Loa A. Date of Service: 03/11/2015 9:30 AM Medical Record Number: [id number] Patient Account Number: [id number] Date of Birth/Gender: [birth year] (68 y.o. Male) Treating RN: Cornell Barman Primary Care Physician: [name] Other Clinician: Referring Physician: [name] Treating Physician/Extender: BURNS III, Charlean Sanfilippo in Treatment: 12 Education Assessment Education Provided To: Patient Education Topics  Provided Wound/Skin Impairment: Handouts: Caring for Your Ulcer, Skin Care Do's and Dont's, Other: continue wound care as prescribed Methods: Demonstration, Explain/Verbal Responses: State  content correctly Electronic Signature(s) Signed: 03/11/2015 4:54:35 PM By: Gretta Cool, RN, BSN, Kim RN, BSN Entered By: Gretta Cool, RN, BSN, Kim on 03/11/2015 10:35:46 Bradley [name] ([id number]) -------------------------------------------------------------------------------- Wound Assessment Details Patient Name: Bradley [name], Bradley A. Date of Service: 03/11/2015 9:30 AM Medical Record Number: [id number] Patient Account Number: [id number] Date of Birth/Sex: [birth year] (69 y.o. Male) Treating RN: Baruch Gouty, RN, BSN, Velva Harman Primary Care Physician: [name] Other Clinician: Referring Physician: [name] Treating Physician/Extender: BURNS III, WALTER Weeks in Treatment: 12 Wound Status Wound Number: 3 Primary Pressure Ulcer Etiology: Wound Location: Sacrum - Medial Wound Status: Open Wounding Event: Gradually Appeared Comorbid Hypertension, Rheumatoid Arthritis, Date Acquired: 07/07/2014 History: Paraplegia Weeks Of Treatment: 12 Clustered Wound: No Photos Photo Uploaded By: Regan Lemming on 03/11/2015 16:05:00 Wound Measurements Length: (cm) 8.5 Width: (cm) 3 Depth: (cm) 0.2 Area: (cm) 20.028 Volume: (cm) 4.006 % Reduction in Area: 69.6% % Reduction in Volume: 79.7% Epithelialization: Medium (34-66%) Tunneling: No Undermining: No Wound Description Classification: Category/Stage III Wound Margin: Distinct, outline attached Exudate Amount: Medium Exudate Type: Serosanguineous Exudate Color: red, brown Foul Odor After Cleansing: No Wound Bed Granulation Amount: Large (67-100%) Exposed Structure Necrotic Amount: None Present (0%) Fascia Exposed: No Fat Layer Exposed: No Tendon Exposed: No Bradley [name], Bradley A. ([id number]) Muscle Exposed: No Joint Exposed: No Bone Exposed: No Limited to Skin Breakdown Periwound Skin Texture Texture Color No Abnormalities Noted: No No Abnormalities Noted: No Callus: No Atrophie Blanche: No Crepitus: No Cyanosis: No Excoriation:  No Ecchymosis: No Fluctuance: No Erythema: No Friable: No Hemosiderin Staining: No Induration: No Mottled: No Localized Edema: No Pallor: No Rash: No Rubor: No Scarring: Yes Temperature / Pain Moisture Temperature: No Abnormality No Abnormalities Noted: No Dry / Scaly: No Maceration: Yes Moist: Yes Wound Preparation Ulcer Cleansing: Rinsed/Irrigated with Saline Topical Anesthetic Applied: Other: lidocaine 4%, Treatment Notes Wound #3 (Medial Sacrum) 1. Cleansed with: Clean wound with Normal Saline 2. Anesthetic Topical Lidocaine 4% cream to wound bed prior to debridement 3. Peri-wound Care: Barrier cream 4. Dressing Applied: Aquacel Ag 5. Secondary Dressing Applied Bordered Foam Dressing Electronic Signature(s) Signed: 03/11/2015 4:05:44 PM By: Regan Lemming BSN, RN Entered By: Regan Lemming on 03/11/2015 10:21:30 Bradley [name] ([id number]) -------------------------------------------------------------------------------- Vitals Details Patient Name: Bradley Loa A. Date of Service: 03/11/2015 9:30 AM Medical Record Number: [id number] Patient Account Number: [id number] Date of Birth/Sex: 1946/02/13 (69 y.o. Male) Treating RN: Baruch Gouty, RN, BSN, [place] Primary Care Physician: [name] Other Clinician: Referring Physician: [name] Treating Physician/Extender: BURNS III, Charlean Sanfilippo in Treatment: 12 Vital Signs Time Taken: 10:10 Temperature (F): 97.8 Height (in): 73 Pulse (bpm): 87 Weight (lbs): 205 Respiratory Rate (breaths/min): 18 Body Mass Index (BMI): 27 Blood Pressure (mmHg): 136/118 Reference Range: 80 - 120 mg / dl Electronic Signature(s) Signed: 03/11/2015 4:05:44 PM By: Regan Lemming BSN, RN Entered By: Regan Lemming on 03/11/2015 10:11:17
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
## 2015-03-17 DIAGNOSIS — E785 Hyperlipidemia, unspecified: Secondary | ICD-10-CM | POA: Diagnosis not present

## 2015-03-17 DIAGNOSIS — G8220 Paraplegia, unspecified: Secondary | ICD-10-CM | POA: Diagnosis not present

## 2015-03-17 DIAGNOSIS — D649 Anemia, unspecified: Secondary | ICD-10-CM | POA: Diagnosis not present

## 2015-03-17 DIAGNOSIS — I10 Essential (primary) hypertension: Secondary | ICD-10-CM | POA: Diagnosis not present

## 2015-03-17 DIAGNOSIS — L89153 Pressure ulcer of sacral region, stage 3: Secondary | ICD-10-CM | POA: Diagnosis not present

## 2015-03-17 DIAGNOSIS — Z87440 Personal history of urinary (tract) infections: Secondary | ICD-10-CM | POA: Diagnosis not present

## 2015-03-18 DIAGNOSIS — G8220 Paraplegia, unspecified: Secondary | ICD-10-CM | POA: Diagnosis not present

## 2015-03-18 DIAGNOSIS — D649 Anemia, unspecified: Secondary | ICD-10-CM | POA: Diagnosis not present

## 2015-03-18 DIAGNOSIS — I10 Essential (primary) hypertension: Secondary | ICD-10-CM | POA: Diagnosis not present

## 2015-03-18 DIAGNOSIS — Z87440 Personal history of urinary (tract) infections: Secondary | ICD-10-CM | POA: Diagnosis not present

## 2015-03-18 DIAGNOSIS — E785 Hyperlipidemia, unspecified: Secondary | ICD-10-CM | POA: Diagnosis not present

## 2015-03-18 DIAGNOSIS — L89153 Pressure ulcer of sacral region, stage 3: Secondary | ICD-10-CM | POA: Diagnosis not present

## 2015-03-24 DIAGNOSIS — G8220 Paraplegia, unspecified: Secondary | ICD-10-CM | POA: Diagnosis not present

## 2015-03-24 DIAGNOSIS — L89153 Pressure ulcer of sacral region, stage 3: Secondary | ICD-10-CM | POA: Diagnosis not present

## 2015-03-24 DIAGNOSIS — E785 Hyperlipidemia, unspecified: Secondary | ICD-10-CM | POA: Diagnosis not present

## 2015-03-24 DIAGNOSIS — Z87440 Personal history of urinary (tract) infections: Secondary | ICD-10-CM | POA: Diagnosis not present

## 2015-03-24 DIAGNOSIS — I10 Essential (primary) hypertension: Secondary | ICD-10-CM | POA: Diagnosis not present

## 2015-03-24 DIAGNOSIS — D649 Anemia, unspecified: Secondary | ICD-10-CM | POA: Diagnosis not present

## 2015-03-25 DIAGNOSIS — E785 Hyperlipidemia, unspecified: Secondary | ICD-10-CM | POA: Diagnosis not present

## 2015-03-25 DIAGNOSIS — Z87440 Personal history of urinary (tract) infections: Secondary | ICD-10-CM | POA: Diagnosis not present

## 2015-03-25 DIAGNOSIS — D649 Anemia, unspecified: Secondary | ICD-10-CM | POA: Diagnosis not present

## 2015-03-25 DIAGNOSIS — L89153 Pressure ulcer of sacral region, stage 3: Secondary | ICD-10-CM | POA: Diagnosis not present

## 2015-03-25 DIAGNOSIS — I10 Essential (primary) hypertension: Secondary | ICD-10-CM | POA: Diagnosis not present

## 2015-03-25 DIAGNOSIS — G8220 Paraplegia, unspecified: Secondary | ICD-10-CM | POA: Diagnosis not present

## 2015-03-27 DIAGNOSIS — G8220 Paraplegia, unspecified: Secondary | ICD-10-CM | POA: Diagnosis not present

## 2015-03-27 DIAGNOSIS — I10 Essential (primary) hypertension: Secondary | ICD-10-CM | POA: Diagnosis not present

## 2015-03-27 DIAGNOSIS — S31809A Unspecified open wound of unspecified buttock, initial encounter: Secondary | ICD-10-CM | POA: Diagnosis not present

## 2015-03-27 DIAGNOSIS — Z87440 Personal history of urinary (tract) infections: Secondary | ICD-10-CM | POA: Diagnosis not present

## 2015-03-27 DIAGNOSIS — D649 Anemia, unspecified: Secondary | ICD-10-CM | POA: Diagnosis not present

## 2015-03-27 DIAGNOSIS — E785 Hyperlipidemia, unspecified: Secondary | ICD-10-CM | POA: Diagnosis not present

## 2015-03-27 DIAGNOSIS — L89153 Pressure ulcer of sacral region, stage 3: Secondary | ICD-10-CM | POA: Diagnosis not present

## 2015-03-30 DIAGNOSIS — S31809A Unspecified open wound of unspecified buttock, initial encounter: Secondary | ICD-10-CM | POA: Diagnosis not present

## 2015-04-01 ENCOUNTER — Encounter: Payer: Commercial Managed Care - HMO | Admitting: Surgery

## 2015-04-01 DIAGNOSIS — G8220 Paraplegia, unspecified: Secondary | ICD-10-CM | POA: Diagnosis not present

## 2015-04-01 DIAGNOSIS — L89153 Pressure ulcer of sacral region, stage 3: Secondary | ICD-10-CM | POA: Diagnosis not present

## 2015-04-01 DIAGNOSIS — L89323 Pressure ulcer of left buttock, stage 3: Secondary | ICD-10-CM | POA: Diagnosis not present

## 2015-04-01 DIAGNOSIS — L89313 Pressure ulcer of right buttock, stage 3: Secondary | ICD-10-CM | POA: Diagnosis not present
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
## 2015-04-02 NOTE — Progress Notes (Signed)
JACORIE, ERNSBERGER ([id number]) Visit Report for 04/01/2015 Arrival Information Details Patient Name: Bradley [name], Bradley [name]. Date of Service: 04/01/2015 10:00 AM Medical Record Number: [id number] Patient Account Number: [id number] Date of Birth/Sex: [birth year] (69 y.o. Male) Treating RN: Cornell Barman Primary Care Physician: [name] Other Clinician: Referring Physician: [name] Treating Physician/Extender: BURNS III, Charlean Sanfilippo in Treatment: 15 Visit Information History Since Last Visit Added or deleted any medications: No Patient Arrived: Wheel Chair Any new allergies or adverse reactions: No Arrival Time: 09:49 Had a fall or experienced change in No activities of daily living that may affect Accompanied By: self risk of falls: Transfer Assistance: None Signs or symptoms of abuse/neglect since last No Patient Identification Verified: Yes visito Secondary Verification Process Yes Hospitalized since last visit: No Completed: Has Dressing in Place as Prescribed: Yes Patient Requires Transmission-Based No Pain Present Now: No Precautions: Patient Has Alerts: No Electronic Signature(s) Signed: 04/01/2015 5:15:26 PM By: Gretta Cool, RN, BSN, Kim RN, BSN Entered By: Gretta Cool, RN, BSN, Kim on 04/01/2015 09:49:48 [name] ([id number]) -------------------------------------------------------------------------------- Clinic Level of Care Assessment Details Patient Name: Bradley, MALKIN A. Date of Service: 04/01/2015 10:00 AM Medical Record Number: [id number] Patient Account Number: [id number] Date of Birth/Sex: 08/20/1945 (69 y.o. Male) Treating RN: Cornell Barman Primary Care Physician: [name] Other Clinician: Referring Physician: [name] Treating Physician/Extender: BURNS III, Charlean Sanfilippo in Treatment: 15 Clinic Level of Care Assessment Items TOOL 4 Quantity Score []  - Use when only an EandM is performed on FOLLOW-UP visit 0 ASSESSMENTS - Nursing  Assessment / Reassessment []  - Reassessment of Co-morbidities (includes updates in patient status) 0 X - Reassessment of Adherence to Treatment Plan 1 5 ASSESSMENTS - Wound and Skin Assessment / Reassessment X - Simple Wound Assessment / Reassessment - one wound 1 5 []  - Complex Wound Assessment / Reassessment - multiple wounds 0 []  - Dermatologic / Skin Assessment (not related to wound area) 0 ASSESSMENTS - Focused Assessment []  - Circumferential Edema Measurements - multi extremities 0 []  - Nutritional Assessment / Counseling / Intervention 0 []  - Lower Extremity Assessment (monofilament, tuning fork, pulses) 0 []  - Peripheral Arterial Disease Assessment (using hand held doppler) 0 ASSESSMENTS - Ostomy and/or Continence Assessment and Care []  - Incontinence Assessment and Management 0 []  - Ostomy Care Assessment and Management (repouching, etc.) 0 PROCESS - Coordination of Care X - Simple Patient / Family Education for ongoing care 1 15 []  - Complex (extensive) Patient / Family Education for ongoing care 0 X - Staff obtains Programmer, systems, Records, Test Results / Process Orders 1 10 []  - Staff telephones HHA, Nursing Homes / Clarify orders / etc 0 []  - Routine Transfer to another Facility (non-emergent condition) 0 CREE, KUNERT ([id number]) []  - Routine Hospital Admission (non-emergent condition) 0 []  - New Admissions / Biomedical engineer / Ordering NPWT, Apligraf, etc. 0 []  - Emergency Hospital Admission (emergent condition) 0 X - Simple Discharge Coordination 1 10 []  - Complex (extensive) Discharge Coordination 0 PROCESS - Special Needs []  - Pediatric / Minor Patient Management 0 []  - Isolation Patient Management 0 []  - Hearing / Language / Visual special needs 0 []  - Assessment of Community assistance (transportation, D/C planning, etc.) 0 []  - Additional assistance / Altered mentation 0 []  - Support Surface(s) Assessment (bed, cushion, seat, etc.) 0 INTERVENTIONS - Wound  Cleansing / Measurement X - Simple Wound Cleansing - one wound 1 5 []  - Complex Wound Cleansing - multiple wounds 0 X - Wound Imaging (photographs -  any number of wounds) 1 5 []  - Wound Tracing (instead of photographs) 0 X - Simple Wound Measurement - one wound 1 5 []  - Complex Wound Measurement - multiple wounds 0 INTERVENTIONS - Wound Dressings X - Small Wound Dressing one or multiple wounds 1 10 []  - Medium Wound Dressing one or multiple wounds 0 []  - Large Wound Dressing one or multiple wounds 0 X - Application of Medications - topical 1 5 []  - Application of Medications - injection 0 INTERVENTIONS - Miscellaneous []  - External ear exam 0 DEAVON, PODGORSKI A. ([id number]) []  - Specimen Collection (cultures, biopsies, blood, body fluids, etc.) 0 []  - Specimen(s) / Culture(s) sent or taken to Lab for analysis 0 X - Patient Transfer (multiple staff / Civil Service fast streamer / Similar devices) 1 10 []  - Simple Staple / Suture removal (25 or less) 0 []  - Complex Staple / Suture removal (26 or more) 0 []  - Hypo / Hyperglycemic Management (close monitor of Blood Glucose) 0 []  - Ankle / Brachial Index (ABI) - do not check if billed separately 0 X - Vital Signs 1 5 Has the patient been seen at the hospital within the last three years: Yes Total Score: 90 Level Of Care: New/Established - Level 3 Electronic Signature(s) Signed: 04/01/2015 5:15:26 PM By: Gretta Cool, RN, BSN, Kim RN, BSN Entered By: Gretta Cool, RN, BSN, Kim on 04/01/2015 10:19:43 [name] ([id number]) -------------------------------------------------------------------------------- Encounter Discharge Information Details Patient Name: Bradley Loa A. Date of Service: 04/01/2015 10:00 AM Medical Record Number: [id number] Patient Account Number: [id number] Date of Birth/Sex: February 25, 1946 (69 y.o. Male) Treating RN: Cornell Barman Primary Care Physician: [name] Other Clinician: Referring Physician: [name] Treating  Physician/Extender: BURNS III, Charlean Sanfilippo in Treatment: 15 Encounter Discharge Information Items Discharge Pain Level: 0 Discharge Condition: Stable Ambulatory Status: Cane Discharge Destination: Home Transportation: Private Auto Accompanied By: self Schedule Follow-up Appointment: Yes Medication Reconciliation completed and provided to Patient/Care Yes Girtie Wiersma: Provided on Clinical Summary of Care: 04/01/2015 Form Type Recipient Paper Patient JG Electronic Signature(s) Signed: 04/01/2015 10:28:31 AM By: Ruthine Dose Entered By: Ruthine Dose on 04/01/2015 10:28:30 [name] ([id number]) -------------------------------------------------------------------------------- Multi Wound Chart Details Patient Name: Bradley Loa A. Date of Service: 04/01/2015 10:00 AM Medical Record Number: [id number] Patient Account Number: [id number] Date of Birth/Sex: 1945-11-15 (69 y.o. Male) Treating RN: Cornell Barman Primary Care Physician: [name] Other Clinician: Referring Physician: [name] Treating Physician/Extender: BURNS III, Charlean Sanfilippo in Treatment: 15 Vital Signs Height(in): 73 Pulse(bpm): 70 Weight(lbs): 205 Blood Pressure 112/53 (mmHg): Body Mass Index(BMI): 27 Temperature(F): 97.9 Respiratory Rate 18 (breaths/min): Photos: [3:No Photos] [N/A:N/A] Wound Location: [3:Sacrum - Medial] [N/A:N/A] Wounding Event: [3:Gradually Appeared] [N/A:N/A] Primary Etiology: [3:Pressure Ulcer] [N/A:N/A] Comorbid History: [3:Hypertension, Rheumatoid Arthritis, Paraplegia] [N/A:N/A] Date Acquired: [3:07/07/2014] [N/A:N/A] Weeks of Treatment: [3:15] [N/A:N/A] Wound Status: [3:Open] [N/A:N/A] Measurements L x W x D 9x3x0.3 [N/A:N/A] (cm) Area (cm) : [3:21.206] [N/A:N/A] Volume (cm) : [3:6.362] [N/A:N/A] % Reduction in Area: [3:67.80%] [N/A:N/A] % Reduction in Volume: 67.80% [N/A:N/A] Classification: [3:Category/Stage III] [N/A:N/A] Exudate Amount:  [3:Medium] [N/A:N/A] Exudate Type: [3:Serosanguineous] [N/A:N/A] Exudate Color: [3:red, brown] [N/A:N/A] Wound Margin: [3:Distinct, outline attached] [N/A:N/A] Granulation Amount: [3:Large (67-100%)] [N/A:N/A] Necrotic Amount: [3:None Present (0%)] [N/A:N/A] Exposed Structures: [3:Fascia: No Fat: No Tendon: No Muscle: No Joint: No Bone: No] [N/A:N/A] Limited to Skin Breakdown Epithelialization: Medium (34-66%) N/A N/A Periwound Skin Texture: Scarring: Yes N/A N/A Edema: No Excoriation: No Induration: No Callus: No Crepitus: No Fluctuance: No Friable: No Rash: No Periwound Skin Moist: Yes  N/A N/A Moisture: Maceration: No Dry/Scaly: No Periwound Skin Color: Atrophie Blanche: No N/A N/A Cyanosis: No Ecchymosis: No Erythema: No Hemosiderin Staining: No Mottled: No Pallor: No Rubor: No Temperature: No Abnormality N/A N/A Tenderness on No N/A N/A Palpation: Wound Preparation: Ulcer Cleansing: N/A N/A Rinsed/Irrigated with Saline Topical Anesthetic Applied: Other: lidocaine 4% Treatment Notes Electronic Signature(s) Signed: 04/01/2015 5:15:26 PM By: Gretta Cool, RN, BSN, Kim RN, BSN Entered By: Gretta Cool, RN, BSN, Kim on 04/01/2015 10:16:58 [name] ([id number]) -------------------------------------------------------------------------------- [place] Details Patient Name: FAROOQ, PETROVICH. Date of Service: 04/01/2015 10:00 AM Medical Record Number: [id number] Patient Account Number: [id number] Date of Birth/Sex: 10/21/1945 (69 y.o. Male) Treating RN: Cornell Barman Primary Care Physician: [name] Other Clinician: Referring Physician: [name] Treating Physician/Extender: BURNS III, Charlean Sanfilippo in Treatment: 15 Active Inactive Orientation to the Wound Care Program Nursing Diagnoses: Knowledge deficit related to the wound healing center program Goals: Patient/caregiver will verbalize understanding of the [facility]  Program Date Initiated: 12/17/2014 Goal Status: Active Interventions: Provide education on orientation to the wound center Notes: Pressure Nursing Diagnoses: Knowledge deficit related to management of pressures ulcers Potential for impaired tissue integrity related to pressure, friction, moisture, and shear Goals: Patient will remain free from development of additional pressure ulcers Date Initiated: 12/17/2014 Goal Status: Active Interventions: Assess: immobility, friction, shearing, incontinence upon admission and as needed Provide education on pressure ulcers Notes: Wound/Skin Impairment Nursing Diagnoses: Impaired tissue integrity GoalsLYNDALL, WINDT ([id number]) Ulcer/skin breakdown will have a volume reduction of 30% by week 4 Date Initiated: 12/17/2014 Goal Status: Active Interventions: Assess ulceration(s) every visit Notes: Electronic Signature(s) Signed: 04/01/2015 5:15:26 PM By: Gretta Cool, RN, BSN, Kim RN, BSN Entered By: Gretta Cool, RN, BSN, Kim on 04/01/2015 10:16:50 [name] ([id number]) -------------------------------------------------------------------------------- Pain Assessment Details Patient Name: Bradley Loa A. Date of Service: 04/01/2015 10:00 AM Medical Record Number: [id number] Patient Account Number: [id number] Date of Birth/Sex: 1945-11-23 (69 y.o. Male) Treating RN: Cornell Barman Primary Care Physician: [name] Other Clinician: Referring Physician: [name] Treating Physician/Extender: BURNS III, Charlean Sanfilippo in Treatment: 15 Active Problems Location of Pain Severity and Description of Pain Patient Has Paino No Site Locations Pain Management and Medication Current Pain Management: Electronic Signature(s) Signed: 04/01/2015 5:15:26 PM By: Gretta Cool, RN, BSN, Kim RN, BSN Entered By: Gretta Cool, RN, BSN, Kim on 04/01/2015 09:49:55 [name]  ([id number]) -------------------------------------------------------------------------------- Patient/Caregiver Education Details Patient Name: Bradley Loa A. Date of Service: 04/01/2015 10:00 AM Medical Record Number: [id number] Patient Account Number: [id number] Date of Birth/Gender: February 25, 1946 (69 y.o. Male) Treating RN: Cornell Barman Primary Care Physician: [name] Other Clinician: Referring Physician: [name] Treating Physician/Extender: BURNS III, Charlean Sanfilippo in Treatment: 15 Education Assessment Education Provided To: Patient Education Topics Provided Wound/Skin Impairment: Handouts: Caring for Your Ulcer, Other: continue wound care as prescribed Methods: Demonstration, Explain/Verbal Responses: State content correctly Electronic Signature(s) Signed: 04/01/2015 5:15:26 PM By: Gretta Cool, RN, BSN, Kim RN, BSN Entered By: Gretta Cool, RN, BSN, Kim on 04/01/2015 10:20:50 [name] ([id number]) -------------------------------------------------------------------------------- Wound Assessment Details Patient Name: BRYKER, FLETCHALL A. Date of Service: 04/01/2015 10:00 AM Medical Record Number: [id number] Patient Account Number: [id number] Date of Birth/Sex: 03-26-1946 (69 y.o. Male) Treating RN: Cornell Barman Primary Care Physician: [name] Other Clinician: Referring Physician: [name] Treating Physician/Extender: BURNS III, Charlean Sanfilippo in Treatment: 15 Wound Status Wound Number: 3 Primary Pressure Ulcer Etiology: Wound Location: Sacrum - Medial Wound Status: Open Wounding Event: Gradually Appeared Comorbid Hypertension, Rheumatoid Arthritis, Date Acquired: 07/07/2014  History: Paraplegia Weeks Of Treatment: 15 Clustered Wound: No Photos Photo Uploaded By: Gretta Cool, RN, BSN, Kim on 04/01/2015 13:45:06 Wound Measurements Length: (cm) 9 Width: (cm) 3 Depth: (cm) 0.3 Area: (cm) 21.206 Volume: (cm) 6.362 % Reduction in Area: 67.8% %  Reduction in Volume: 67.8% Epithelialization: Medium (34-66%) Wound Description Classification: Category/Stage III Wound Margin: Distinct, outline attached Exudate Amount: Medium Exudate Type: Serosanguineous Exudate Color: red, brown Foul Odor After Cleansing: No Wound Bed Granulation Amount: Large (67-100%) Exposed Structure Necrotic Amount: None Present (0%) Fascia Exposed: No Fat Layer Exposed: No Tendon Exposed: No AMAURIS, DEBOIS A. ([id number]) Muscle Exposed: No Joint Exposed: No Bone Exposed: No Limited to Skin Breakdown Periwound Skin Texture Texture Color No Abnormalities Noted: No No Abnormalities Noted: No Callus: No Atrophie Blanche: No Crepitus: No Cyanosis: No Excoriation: No Ecchymosis: No Fluctuance: No Erythema: No Friable: No Hemosiderin Staining: No Induration: No Mottled: No Localized Edema: No Pallor: No Rash: No Rubor: No Scarring: Yes Temperature / Pain Moisture Temperature: No Abnormality No Abnormalities Noted: No Dry / Scaly: No Maceration: No Moist: Yes Wound Preparation Ulcer Cleansing: Rinsed/Irrigated with Saline Topical Anesthetic Applied: Other: lidocaine 4%, Treatment Notes Wound #3 (Medial Sacrum) 1. Cleansed with: Clean wound with Normal Saline 2. Anesthetic Topical Lidocaine 4% cream to wound bed prior to debridement 4. Dressing Applied: Silvadene Cream 5. Secondary Dressing Applied Bordered Foam Dressing Electronic Signature(s) Signed: 04/01/2015 5:15:26 PM By: Gretta Cool, RN, BSN, Kim RN, BSN Entered By: Gretta Cool, RN, BSN, Kim on 04/01/2015 10:01:38 [name] ([id number]) -------------------------------------------------------------------------------- [place] Details Patient Name: [name] Date of Service: 04/01/2015 10:00 AM Medical Record Number: [id number] Patient Account Number: [id number] Date of Birth/Sex: 27-May-1946 (69 y.o. Male) Treating RN: Cornell Barman Primary Care Physician: [name] Other Clinician: Referring Physician: [name] Treating Physician/Extender: BURNS III, Charlean Sanfilippo in Treatment: 15 Vital Signs Time Taken: 09:54 Temperature (F): 97.9 Height (in): 73 Pulse (bpm): 70 Weight (lbs): 205 Respiratory Rate (breaths/min): 18 Body Mass Index (BMI): 27 Blood Pressure (mmHg): 112/53 Reference Range: 80 - 120 mg / dl Electronic Signature(s) Signed: 04/01/2015 5:15:26 PM By: Gretta Cool, RN, BSN, Kim RN, BSN Entered By: Gretta Cool, RN, BSN, Kim on 04/01/2015 10:01:03

## 2015-04-03 DIAGNOSIS — Z87440 Personal history of urinary (tract) infections: Secondary | ICD-10-CM | POA: Diagnosis not present

## 2015-04-03 DIAGNOSIS — E785 Hyperlipidemia, unspecified: Secondary | ICD-10-CM | POA: Diagnosis not present

## 2015-04-03 DIAGNOSIS — I10 Essential (primary) hypertension: Secondary | ICD-10-CM | POA: Diagnosis not present

## 2015-04-03 DIAGNOSIS — G8220 Paraplegia, unspecified: Secondary | ICD-10-CM | POA: Diagnosis not present

## 2015-04-03 DIAGNOSIS — D649 Anemia, unspecified: Secondary | ICD-10-CM | POA: Diagnosis not present

## 2015-04-03 DIAGNOSIS — L89153 Pressure ulcer of sacral region, stage 3: Secondary | ICD-10-CM | POA: Diagnosis not present

## 2015-04-08 DIAGNOSIS — Z87440 Personal history of urinary (tract) infections: Secondary | ICD-10-CM | POA: Diagnosis not present

## 2015-04-08 DIAGNOSIS — I10 Essential (primary) hypertension: Secondary | ICD-10-CM | POA: Diagnosis not present

## 2015-04-08 DIAGNOSIS — L89153 Pressure ulcer of sacral region, stage 3: Secondary | ICD-10-CM | POA: Diagnosis not present

## 2015-04-08 DIAGNOSIS — D649 Anemia, unspecified: Secondary | ICD-10-CM | POA: Diagnosis not present

## 2015-04-08 DIAGNOSIS — E785 Hyperlipidemia, unspecified: Secondary | ICD-10-CM | POA: Diagnosis not present

## 2015-04-08 DIAGNOSIS — G8220 Paraplegia, unspecified: Secondary | ICD-10-CM | POA: Diagnosis not present

## 2015-04-15 ENCOUNTER — Encounter: Payer: Commercial Managed Care - HMO | Attending: Surgery | Admitting: Surgery

## 2015-04-15 DIAGNOSIS — L89153 Pressure ulcer of sacral region, stage 3: Secondary | ICD-10-CM | POA: Insufficient documentation

## 2015-04-15 DIAGNOSIS — G8220 Paraplegia, unspecified: Secondary | ICD-10-CM | POA: Insufficient documentation

## 2015-04-15 DIAGNOSIS — L89323 Pressure ulcer of left buttock, stage 3: Secondary | ICD-10-CM | POA: Insufficient documentation

## 2015-04-16 NOTE — Progress Notes (Signed)
Bradley, [name] ([id number]) Visit Report for 04/15/2015 Arrival Information Details Patient Name: Bradley [name], Bradley [name]. Date of Service: 04/15/2015 9:15 AM Medical Record Number: [id number] Patient Account Number: [id number] Date of Birth/Sex: Dec 28, 1945 (69 y.o. Male) Treating RN: Cornell Barman Primary Care Physician: [name] Other Clinician: Referring Physician: [name] Treating Physician/Extender: BURNS III, Charlean Sanfilippo in Treatment: 52 Visit Information History Since Last Visit Added or deleted any medications: No Patient Arrived: Wheel Chair Any new allergies or adverse reactions: No Arrival Time: 09:19 Had a fall or experienced change in No activities of daily living that may affect Accompanied By: self risk of falls: Transfer Assistance: Hoyer Lift Signs or symptoms of abuse/neglect since last No Patient Identification Verified: Yes visito Secondary Verification Process Yes Hospitalized since last visit: No Completed: Has Dressing in Place as Prescribed: Yes Patient Requires Transmission-Based No Pain Present Now: No Precautions: Patient Has Alerts: No Electronic Signature(s) Signed: 04/15/2015 4:57:45 PM By: Gretta Cool, RN, BSN, Kim RN, BSN Entered By: Gretta Cool, RN, BSN, Kim on 04/15/2015 09:20:04 [name] -------------------------------------------------------------------------------- Clinic Level of Care Assessment Details Patient Name: [name], [name] Date of Service: 04/15/2015 9:15 AM Medical Record Number: [id number] Patient Account Number: [id number] Date of Birth/Sex: 1945/10/22 (69 y.o. Male) Treating RN: Cornell Barman Primary Care Physician: [name] Other Clinician: Referring Physician: [name] Treating Physician/Extender: BURNS III, Charlean Sanfilippo in Treatment: 17 Clinic Level of Care Assessment Items TOOL 4 Quantity Score []  - Use when only an EandM is performed on FOLLOW-UP visit 0 ASSESSMENTS - Nursing  Assessment / Reassessment []  - Reassessment of Co-morbidities (includes updates in patient status) 0 X - Reassessment of Adherence to Treatment Plan 1 5 ASSESSMENTS - Wound and Skin Assessment / Reassessment X - Simple Wound Assessment / Reassessment - one wound 1 5 []  - Complex Wound Assessment / Reassessment - multiple wounds 0 []  - Dermatologic / Skin Assessment (not related to wound area) 0 ASSESSMENTS - Focused Assessment []  - Circumferential Edema Measurements - multi extremities 0 []  - Nutritional Assessment / Counseling / Intervention 0 []  - Lower Extremity Assessment (monofilament, tuning fork, pulses) 0 []  - Peripheral Arterial Disease Assessment (using hand held doppler) 0 ASSESSMENTS - Ostomy and/or Continence Assessment and Care []  - Incontinence Assessment and Management 0 []  - Ostomy Care Assessment and Management (repouching, etc.) 0 PROCESS - Coordination of Care X - Simple Patient / Family Education for ongoing care 1 15 []  - Complex (extensive) Patient / Family Education for ongoing care 0 X - Staff obtains Programmer, systems, Records, Test Results / Process Orders 1 10 []  - Staff telephones HHA, Nursing Homes / Clarify orders / etc 0 []  - Routine Transfer to another Facility (non-emergent condition) 0 TIVON, MOXEY ([id number]) []  - Routine Hospital Admission (non-emergent condition) 0 []  - New Admissions / Biomedical engineer / Ordering NPWT, Apligraf, etc. 0 []  - Emergency Hospital Admission (emergent condition) 0 X - Simple Discharge Coordination 1 10 []  - Complex (extensive) Discharge Coordination 0 PROCESS - Special Needs []  - Pediatric / Minor Patient Management 0 []  - Isolation Patient Management 0 []  - Hearing / Language / Visual special needs 0 []  - Assessment of Community assistance (transportation, D/C planning, etc.) 0 []  - Additional assistance / Altered mentation 0 []  - Support Surface(s) Assessment (bed, cushion, seat, etc.) 0 INTERVENTIONS - Wound  Cleansing / Measurement X - Simple Wound Cleansing - one wound 1 5 []  - Complex Wound Cleansing - multiple wounds 0 X - Wound Imaging (photographs -  any number of wounds) 1 5 []  - Wound Tracing (instead of photographs) 0 X - Simple Wound Measurement - one wound 1 5 []  - Complex Wound Measurement - multiple wounds 0 INTERVENTIONS - Wound Dressings X - Small Wound Dressing one or multiple wounds 1 10 []  - Medium Wound Dressing one or multiple wounds 0 []  - Large Wound Dressing one or multiple wounds 0 []  - Application of Medications - topical 0 []  - Application of Medications - injection 0 INTERVENTIONS - Miscellaneous []  - External ear exam 0 KOHLER, AGERTON A. ([id number]) []  - Specimen Collection (cultures, biopsies, blood, body fluids, etc.) 0 []  - Specimen(s) / Culture(s) sent or taken to Lab for analysis 0 X - Patient Transfer (multiple staff / Civil Service fast streamer / Similar devices) 1 10 []  - Simple Staple / Suture removal (25 or less) 0 []  - Complex Staple / Suture removal (26 or more) 0 []  - Hypo / Hyperglycemic Management (close monitor of Blood Glucose) 0 []  - Ankle / Brachial Index (ABI) - do not check if billed separately 0 X - Vital Signs 1 5 Has the patient been seen at the hospital within the last three years: Yes Total Score: 85 Level Of Care: New/Established - Level 3 Electronic Signature(s) Signed: 04/15/2015 4:57:45 PM By: Gretta Cool, RN, BSN, Kim RN, BSN Entered By: Gretta Cool, RN, BSN, Kim on 04/15/2015 09:51:15 [name] -------------------------------------------------------------------------------- Encounter Discharge Information Details Patient Name: Bradley Loa A. Date of Service: 04/15/2015 9:15 AM Medical Record Number: [id number] Patient Account Number: [id number] Date of Birth/Sex: 08-Feb-1946 (69 y.o. Male) Treating RN: Cornell Barman Primary Care Physician: [name] Other Clinician: Referring Physician: [name] Treating  Physician/Extender: BURNS III, Charlean Sanfilippo in Treatment: 66 Encounter Discharge Information Items Discharge Pain Level: 0 Discharge Condition: Stable Ambulatory Status: Wheelchair Discharge Destination: Home Transportation: Private Auto Accompanied By: self Schedule Follow-up Appointment: Yes Medication Reconciliation completed and provided to Patient/Care Yes Sherrilyn Nairn: Provided on Clinical Summary of Care: 04/15/2015 Form Type Recipient Paper Patient JG Electronic Signature(s) Signed: 04/15/2015 4:57:45 PM By: Gretta Cool RN, BSN, Kim RN, BSN Previous Signature: 04/15/2015 9:49:14 AM Version By: Ruthine Dose Entered By: Gretta Cool RN, BSN, Kim on 04/15/2015 09:52:07 [name] -------------------------------------------------------------------------------- Multi Wound Chart Details Patient Name: Bradley Loa A. Date of Service: 04/15/2015 9:15 AM Medical Record Number: [id number] Patient Account Number: [id number] Date of Birth/Sex: 28-May-1946 (69 y.o. Male) Treating RN: Cornell Barman Primary Care Physician: [name] Other Clinician: Referring Physician: [name] Treating Physician/Extender: BURNS III, Charlean Sanfilippo in Treatment: 17 Vital Signs Height(in): 73 Pulse(bpm): 78 Weight(lbs): 205 Blood Pressure 139/70 (mmHg): Body Mass Index(BMI): 27 Temperature(F): 98.0 Respiratory Rate 18 (breaths/min): Photos: [3:No Photos] [N/A:N/A] Wound Location: [3:Sacrum - Medial] [N/A:N/A] Wounding Event: [3:Gradually Appeared] [N/A:N/A] Primary Etiology: [3:Pressure Ulcer] [N/A:N/A] Comorbid History: [3:Hypertension, Rheumatoid Arthritis, Paraplegia] [N/A:N/A] Date Acquired: [3:07/07/2014] [N/A:N/A] Weeks of Treatment: [3:17] [N/A:N/A] Wound Status: [3:Open] [N/A:N/A] Measurements L x W x D 9x2.2x0.2 [N/A:N/A] (cm) Area (cm) : [3:15.551] [N/A:N/A] Volume (cm) : [3:3.11] [N/A:N/A] % Reduction in Area: [3:76.40%] [N/A:N/A] % Reduction in Volume:  84.30% [N/A:N/A] Classification: [3:Category/Stage III] [N/A:N/A] Exudate Amount: [3:Medium] [N/A:N/A] Exudate Type: [3:Serosanguineous] [N/A:N/A] Exudate Color: [3:red, brown] [N/A:N/A] Wound Margin: [3:Distinct, outline attached] [N/A:N/A] Granulation Amount: [3:Large (67-100%)] [N/A:N/A] Necrotic Amount: [3:Small (1-33%)] [N/A:N/A] Exposed Structures: [3:Fascia: No Fat: No Tendon: No Muscle: No Joint: No Bone: No] [N/A:N/A] Limited to Skin Breakdown Epithelialization: Medium (34-66%) N/A N/A Periwound Skin Texture: Scarring: Yes N/A N/A Edema: No Excoriation: No Induration: No Callus:  No Crepitus: No Fluctuance: No Friable: No Rash: No Periwound Skin Maceration: Yes N/A N/A Moisture: Moist: Yes Dry/Scaly: No Periwound Skin Color: Atrophie Blanche: No N/A N/A Cyanosis: No Ecchymosis: No Erythema: No Hemosiderin Staining: No Mottled: No Pallor: No Rubor: No Temperature: No Abnormality N/A N/A Tenderness on No N/A N/A Palpation: Wound Preparation: Ulcer Cleansing: N/A N/A Rinsed/Irrigated with Saline Topical Anesthetic Applied: Other: lidocaine 4% Treatment Notes Electronic Signature(s) Signed: 04/15/2015 4:57:45 PM By: Gretta Cool, RN, BSN, Kim RN, BSN Entered By: Gretta Cool, RN, BSN, Kim on 04/15/2015 09:48:23 [name] -------------------------------------------------------------------------------- [facility] Details Patient Name: OTHELLO, ANDRADA A. Date of Service: 04/15/2015 9:15 AM Medical Record Number: [id number] Patient Account Number: [id number] Date of Birth/Sex: 1946/03/16 (69 y.o. Male) Treating RN: Cornell Barman Primary Care Physician: [name] Other Clinician: Referring Physician: [name] Treating Physician/Extender: BURNS III, Charlean Sanfilippo in Treatment: 17 Active Inactive Orientation to the Wound Care Program Nursing Diagnoses: Knowledge deficit related to the wound healing center  program Goals: Patient/caregiver will verbalize understanding of the [place] Program Date Initiated: 12/17/2014 Goal Status: Active Interventions: Provide education on orientation to the wound center Notes: Pressure Nursing Diagnoses: Knowledge deficit related to management of pressures ulcers Potential for impaired tissue integrity related to pressure, friction, moisture, and shear Goals: Patient will remain free from development of additional pressure ulcers Date Initiated: 12/17/2014 Goal Status: Active Interventions: Assess: immobility, friction, shearing, incontinence upon admission and as needed Provide education on pressure ulcers Notes: Wound/Skin Impairment Nursing Diagnoses: Impaired tissue integrity GoalsTA, JERONIMO ([id number]) Ulcer/skin breakdown will have a volume reduction of 30% by week 4 Date Initiated: 12/17/2014 Goal Status: Active Interventions: Assess ulceration(s) every visit Notes: Electronic Signature(s) Signed: 04/15/2015 4:57:45 PM By: Gretta Cool, RN, BSN, Kim RN, BSN Entered By: Gretta Cool, RN, BSN, Kim on 04/15/2015 09:48:18 [name] -------------------------------------------------------------------------------- Pain Assessment Details Patient Name: Bradley Loa A. Date of Service: 04/15/2015 9:15 AM Medical Record Number: [id number] Patient Account Number: [id number] Date of Birth/Sex: [birth year] (69 y.o. Male) Treating RN: Cornell Barman Primary Care Physician: [name] Other Clinician: Referring Physician: [name] Treating Physician/Extender: BURNS III, Charlean Sanfilippo in Treatment: 17 Active Problems Location of Pain Severity and Description of Pain Patient Has Paino No Site Locations Pain Management and Medication Current Pain Management: Electronic Signature(s) Signed: 04/15/2015 4:57:45 PM By: Gretta Cool, RN, BSN, Kim RN, BSN Entered By: Gretta Cool, RN, BSN, Kim on 04/15/2015 09:20:10 [name] -------------------------------------------------------------------------------- Patient/Caregiver Education Details Patient Name: Bradley Loa A. Date of Service: 04/15/2015 9:15 AM Medical Record Number: [id number] Patient Account Number: [id number] Date of Birth/Gender: May 07, 1946 (69 y.o. Male) Treating RN: Cornell Barman Primary Care Physician: [name] Other Clinician: Referring Physician: [name] Treating Physician/Extender: BURNS III, Charlean Sanfilippo in Treatment: 17 Education Assessment Education Provided To: Patient Education Topics Provided Wound/Skin Impairment: Handouts: Caring for Your Ulcer, Other: continue wound care as prescribed Methods: Demonstration Responses: State content correctly Electronic Signature(s) Signed: 04/15/2015 4:57:45 PM By: Gretta Cool, RN, BSN, Kim RN, BSN Entered By: Gretta Cool, RN, BSN, Kim on 04/15/2015 09:52:37 [name] -------------------------------------------------------------------------------- Wound Assessment Details Patient Name: DEAVEON, LASSONDE A. Date of Service: 04/15/2015 9:15 AM Medical Record Number: [id number] Patient Account Number: [id number] Date of Birth/Sex: Apr 15, 1946 (69 y.o. Male) Treating RN: Cornell Barman Primary Care Physician: [name] Other Clinician: Referring Physician: [name] Treating Physician/Extender: BURNS III, Charlean Sanfilippo in Treatment: 17 Wound Status Wound Number: 3 Primary Pressure Ulcer Etiology: Wound Location: Sacrum - Medial Wound Status:  Open Wounding Event: Gradually Appeared Comorbid Hypertension, Rheumatoid Arthritis, Date Acquired: 07/07/2014 History: Paraplegia Weeks Of Treatment: 17 Clustered Wound: No Photos Photo Uploaded By: Gretta Cool, RN, BSN, Kim on 04/15/2015 09:58:59 Wound Measurements Length: (cm) 9 Width: (cm) 2.2 Depth: (cm) 0.2 Area: (cm) 15.551 Volume: (cm) 3.11 % Reduction in Area: 76.4% % Reduction in Volume:  84.3% Epithelialization: Medium (34-66%) Wound Description Classification: Category/Stage III Wound Margin: Distinct, outline attached Exudate Amount: Medium Exudate Type: Serosanguineous Exudate Color: red, brown Foul Odor After Cleansing: No Wound Bed Granulation Amount: Large (67-100%) Exposed Structure Necrotic Amount: Small (1-33%) Fascia Exposed: No Necrotic Quality: Adherent Slough Fat Layer Exposed: No Tendon Exposed: No LANDERS, SUR A. ([id number]) Muscle Exposed: No Joint Exposed: No Bone Exposed: No Limited to Skin Breakdown Periwound Skin Texture Texture Color No Abnormalities Noted: No No Abnormalities Noted: No Callus: No Atrophie Blanche: No Crepitus: No Cyanosis: No Excoriation: No Ecchymosis: No Fluctuance: No Erythema: No Friable: No Hemosiderin Staining: No Induration: No Mottled: No Localized Edema: No Pallor: No Rash: No Rubor: No Scarring: Yes Temperature / Pain Moisture Temperature: No Abnormality No Abnormalities Noted: No Dry / Scaly: No Maceration: Yes Moist: Yes Wound Preparation Ulcer Cleansing: Rinsed/Irrigated with Saline Topical Anesthetic Applied: Other: lidocaine 4%, Treatment Notes Wound #3 (Medial Sacrum) 1. Cleansed with: Clean wound with Normal Saline 2. Anesthetic Topical Lidocaine 4% cream to wound bed prior to debridement 3. Peri-wound Care: Skin Prep 4. Dressing Applied: Aquacel Ag 5. Secondary Dressing Applied Bordered Foam Dressing Electronic Signature(s) Signed: 04/15/2015 4:57:45 PM By: Gretta Cool, RN, BSN, Kim RN, BSN Entered By: Gretta Cool, RN, BSN, Kim on 04/15/2015 [id number] [name] -------------------------------------------------------------------------------- [place] Details Patient Name: Bradley Loa A. Date of Service: 04/15/2015 9:15 AM Medical Record Number: [id number] Patient Account Number: [id number] Date of Birth/Sex: [birth year] (69 y.o. Male) Treating RN: Cornell Barman Primary  Care Physician: [name] Other Clinician: Referring Physician: [name] Treating Physician/Extender: BURNS III, Charlean Sanfilippo in Treatment: 17 Vital Signs Time Taken: 09:20 Temperature (F): 98.0 Height (in): 73 Pulse (bpm): 78 Weight (lbs): 205 Respiratory Rate (breaths/min): 18 Body Mass Index (BMI): 27 Blood Pressure (mmHg): 139/70 Reference Range: 80 - 120 mg / dl Electronic Signature(s) Signed: 04/15/2015 4:57:45 PM By: Gretta Cool, RN, BSN, Kim RN, BSN Entered By: Gretta Cool, RN, BSN, Kim on 04/15/2015 09:20:34
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
## 2015-04-17 DIAGNOSIS — I10 Essential (primary) hypertension: Secondary | ICD-10-CM | POA: Diagnosis not present

## 2015-04-17 DIAGNOSIS — G8220 Paraplegia, unspecified: Secondary | ICD-10-CM | POA: Diagnosis not present

## 2015-04-17 DIAGNOSIS — L89153 Pressure ulcer of sacral region, stage 3: Secondary | ICD-10-CM | POA: Diagnosis not present

## 2015-04-17 DIAGNOSIS — Z87440 Personal history of urinary (tract) infections: Secondary | ICD-10-CM | POA: Diagnosis not present

## 2015-04-17 DIAGNOSIS — E785 Hyperlipidemia, unspecified: Secondary | ICD-10-CM | POA: Diagnosis not present

## 2015-04-17 DIAGNOSIS — D649 Anemia, unspecified: Secondary | ICD-10-CM | POA: Diagnosis not present

## 2015-04-22 DIAGNOSIS — E785 Hyperlipidemia, unspecified: Secondary | ICD-10-CM | POA: Diagnosis not present

## 2015-04-22 DIAGNOSIS — I10 Essential (primary) hypertension: Secondary | ICD-10-CM | POA: Diagnosis not present

## 2015-04-22 DIAGNOSIS — Z87440 Personal history of urinary (tract) infections: Secondary | ICD-10-CM | POA: Diagnosis not present

## 2015-04-22 DIAGNOSIS — L89153 Pressure ulcer of sacral region, stage 3: Secondary | ICD-10-CM | POA: Diagnosis not present

## 2015-04-22 DIAGNOSIS — G8220 Paraplegia, unspecified: Secondary | ICD-10-CM | POA: Diagnosis not present

## 2015-04-22 DIAGNOSIS — D649 Anemia, unspecified: Secondary | ICD-10-CM | POA: Diagnosis not present

## 2015-05-01 DIAGNOSIS — D649 Anemia, unspecified: Secondary | ICD-10-CM | POA: Diagnosis not present

## 2015-05-01 DIAGNOSIS — L89153 Pressure ulcer of sacral region, stage 3: Secondary | ICD-10-CM | POA: Diagnosis not present

## 2015-05-01 DIAGNOSIS — E785 Hyperlipidemia, unspecified: Secondary | ICD-10-CM | POA: Diagnosis not present

## 2015-05-01 DIAGNOSIS — G8220 Paraplegia, unspecified: Secondary | ICD-10-CM | POA: Diagnosis not present

## 2015-05-01 DIAGNOSIS — I10 Essential (primary) hypertension: Secondary | ICD-10-CM | POA: Diagnosis not present

## 2015-05-01 DIAGNOSIS — Z87440 Personal history of urinary (tract) infections: Secondary | ICD-10-CM | POA: Diagnosis not present

## 2015-05-06 ENCOUNTER — Encounter: Payer: Commercial Managed Care - HMO | Admitting: Surgery

## 2015-05-06 DIAGNOSIS — L89323 Pressure ulcer of left buttock, stage 3: Secondary | ICD-10-CM | POA: Diagnosis not present

## 2015-05-06 DIAGNOSIS — G8220 Paraplegia, unspecified: Secondary | ICD-10-CM | POA: Diagnosis not present

## 2015-05-06 DIAGNOSIS — L89153 Pressure ulcer of sacral region, stage 3: Secondary | ICD-10-CM | POA: Diagnosis not present

## 2015-05-07 NOTE — Progress Notes (Signed)
Bradley [name], Bradley [name] ([id number]) Visit Report for 05/06/2015 Arrival Information Details Patient Name: Bradley [name], Bradley [name]. Date of Service: 05/06/2015 9:15 AM Medical Record Number: [id number] Patient Account Number: [id number] Date of Birth/Sex: 1945/08/24 (69 y.o. Male) Treating RN: Montey Hora Primary Care Physician: [name] Other Clinician: Referring Physician: [name] Treating Physician/Extender: BURNS III, Charlean Sanfilippo in Treatment: 20 Visit Information History Since Last Visit Added or deleted any medications: No Patient Arrived: Wheel Chair Any new allergies or adverse reactions: No Arrival Time: 09:31 Had a fall or experienced change in No activities of daily living that may affect Accompanied By: self risk of falls: Transfer Assistance: Hoyer Lift Signs or symptoms of abuse/neglect since last No Patient Identification Verified: Yes visito Secondary Verification Process Yes Hospitalized since last visit: No Completed: Pain Present Now: No Patient Requires Transmission-Based No Precautions: Patient Has Alerts: No Electronic Signature(s) Signed: 05/06/2015 5:54:02 PM By: Montey Hora Entered By: Montey Hora on 05/06/2015 09:32:29 Bradley [name] ([id number]) -------------------------------------------------------------------------------- Clinic Level of Care Assessment Details Patient Name: Bradley [name]. Date of Service: 05/06/2015 9:15 AM Medical Record Number: [id number] Patient Account Number: [id number] Date of Birth/Sex: [birth year] (69 y.o. Male) Treating RN: Montey Hora Primary Care Physician: [name] Other Clinician: Referring Physician: [name] Treating Physician/Extender: BURNS III, Charlean Sanfilippo in Treatment: 20 Clinic Level of Care Assessment Items TOOL 4 Quantity Score []  - Use when only an EandM is performed on FOLLOW-UP visit 0 ASSESSMENTS - Nursing Assessment / Reassessment X - Reassessment of  Co-morbidities (includes updates in patient status) 1 10 X - Reassessment of Adherence to Treatment Plan 1 5 ASSESSMENTS - Wound and Skin Assessment / Reassessment X - Simple Wound Assessment / Reassessment - one wound 1 5 []  - Complex Wound Assessment / Reassessment - multiple wounds 0 []  - Dermatologic / Skin Assessment (not related to wound area) 0 ASSESSMENTS - Focused Assessment []  - Circumferential Edema Measurements - multi extremities 0 []  - Nutritional Assessment / Counseling / Intervention 0 []  - Lower Extremity Assessment (monofilament, tuning fork, pulses) 0 []  - Peripheral Arterial Disease Assessment (using hand held doppler) 0 ASSESSMENTS - Ostomy and/or Continence Assessment and Care []  - Incontinence Assessment and Management 0 []  - Ostomy Care Assessment and Management (repouching, etc.) 0 PROCESS - Coordination of Care X - Simple Patient / Family Education for ongoing care 1 15 []  - Complex (extensive) Patient / Family Education for ongoing care 0 []  - Staff obtains Programmer, systems, Records, Test Results / Process Orders 0 []  - Staff telephones HHA, Nursing Homes / Clarify orders / etc 0 []  - Routine Transfer to another Facility (non-emergent condition) 0 Bradley [name], Bradley [name] ([id number]) []  - Routine Hospital Admission (non-emergent condition) 0 []  - New Admissions / Biomedical engineer / Ordering NPWT, Apligraf, etc. 0 []  - Emergency Hospital Admission (emergent condition) 0 X - Simple Discharge Coordination 1 10 []  - Complex (extensive) Discharge Coordination 0 PROCESS - Special Needs []  - Pediatric / Minor Patient Management 0 []  - Isolation Patient Management 0 []  - Hearing / Language / Visual special needs 0 []  - Assessment of Community assistance (transportation, D/C planning, etc.) 0 []  - Additional assistance / Altered mentation 0 []  - Support Surface(s) Assessment (bed, cushion, seat, etc.) 0 INTERVENTIONS - Wound Cleansing / Measurement X - Simple Wound  Cleansing - one wound 1 5 []  - Complex Wound Cleansing - multiple wounds 0 X - Wound Imaging (photographs - any number of wounds) 1 5 []  - Wound Tracing (instead of  photographs) 0 X - Simple Wound Measurement - one wound 1 5 []  - Complex Wound Measurement - multiple wounds 0 INTERVENTIONS - Wound Dressings X - Small Wound Dressing one or multiple wounds 1 10 []  - Medium Wound Dressing one or multiple wounds 0 []  - Large Wound Dressing one or multiple wounds 0 []  - Application of Medications - topical 0 []  - Application of Medications - injection 0 INTERVENTIONS - Miscellaneous []  - External ear exam 0 Bradley [name], Bradley A. ([id number]) []  - Specimen Collection (cultures, biopsies, blood, body fluids, etc.) 0 []  - Specimen(s) / Culture(s) sent or taken to Lab for analysis 0 []  - Patient Transfer (multiple staff / Harrel Lemon Lift / Similar devices) 0 []  - Simple Staple / Suture removal (25 or less) 0 []  - Complex Staple / Suture removal (26 or more) 0 []  - Hypo / Hyperglycemic Management (close monitor of Blood Glucose) 0 []  - Ankle / Brachial Index (ABI) - do not check if billed separately 0 X - Vital Signs 1 5 Has the patient been seen at the hospital within the last three years: Yes Total Score: 75 Level Of Care: New/Established - Level 2 Electronic Signature(s) Signed: 05/06/2015 5:54:02 PM By: Montey Hora Entered By: Montey Hora on 05/06/2015 10:00:25 Bradley [name] ([id number]) -------------------------------------------------------------------------------- Encounter Discharge Information Details Patient Name: Bradley Loa A. Date of Service: 05/06/2015 9:15 AM Medical Record Number: [id number] Patient Account Number: [id number] Date of Birth/Sex: 10-08-1945 (69 y.o. Male) Treating RN: Montey Hora Primary Care Physician: [name] Other Clinician: Referring Physician: [name] Treating Physician/Extender: BURNS III, Charlean Sanfilippo in Treatment:  20 Encounter Discharge Information Items Discharge Pain Level: 0 Discharge Condition: Stable Ambulatory Status: Wheelchair Discharge Destination: Home Transportation: Private Auto Accompanied By: self Schedule Follow-up Appointment: Yes Medication Reconciliation completed and provided to Patient/Care No Bradley [name]: Provided on Clinical Summary of Care: 05/06/2015 Form Type Recipient Paper Patient JG Electronic Signature(s) Signed: 05/06/2015 3:07:52 PM By: Montey Hora Previous Signature: 05/06/2015 10:25:21 AM Version By: Ruthine Dose Entered By: Montey Hora on 05/06/2015 15:07:52 Bradley [name] ([id number]) -------------------------------------------------------------------------------- Multi Wound Chart Details Patient Name: Bradley Loa A. Date of Service: 05/06/2015 9:15 AM Medical Record Number: [id number] Patient Account Number: [id number] Date of Birth/Sex: Aug 15, 1945 (69 y.o. Male) Treating RN: Montey Hora Primary Care Physician: [name] Other Clinician: Referring Physician: [name] Treating Physician/Extender: BURNS III, Charlean Sanfilippo in Treatment: 20 Vital Signs Height(in): 73 Pulse(bpm): 71 Weight(lbs): 205 Blood Pressure 133/45 (mmHg): Body Mass Index(BMI): 27 Temperature(F): 98.0 Respiratory Rate 18 (breaths/min): Photos: [3:No Photos] [N/A:N/A] Wound Location: [3:Sacrum - Medial] [N/A:N/A] Wounding Event: [3:Gradually Appeared] [N/A:N/A] Primary Etiology: [3:Pressure Ulcer] [N/A:N/A] Comorbid History: [3:Hypertension, Rheumatoid Arthritis, Paraplegia] [N/A:N/A] Date Acquired: [3:07/07/2014] [N/A:N/A] Weeks of Treatment: [3:20] [N/A:N/A] Wound Status: [3:Open] [N/A:N/A] Measurements L x W x D 9.7x3.8x0.5 [N/A:N/A] (cm) Area (cm) : [3:28.95] [N/A:N/A] Volume (cm) : [3:14.475] [N/A:N/A] % Reduction in Area: [3:56.10%] [N/A:N/A] % Reduction in Volume: 26.80% [N/A:N/A] Classification: [3:Category/Stage III]  [N/A:N/A] Exudate Amount: [3:Medium] [N/A:N/A] Exudate Type: [3:Serosanguineous] [N/A:N/A] Exudate Color: [3:red, brown] [N/A:N/A] Wound Margin: [3:Distinct, outline attached] [N/A:N/A] Granulation Amount: [3:Large (67-100%)] [N/A:N/A] Necrotic Amount: [3:Small (1-33%)] [N/A:N/A] Exposed Structures: [3:Fascia: No Fat: No Tendon: No Muscle: No Joint: No Bone: No] [N/A:N/A] Limited to Skin Breakdown Epithelialization: Medium (34-66%) N/A N/A Periwound Skin Texture: Scarring: Yes N/A N/A Edema: No Excoriation: No Induration: No Callus: No Crepitus: No Fluctuance: No Friable: No Rash: No Periwound Skin Maceration: Yes N/A N/A Moisture: Moist: Yes Dry/Scaly: No Periwound Skin Color: Atrophie Blanche:  No N/A N/A Cyanosis: No Ecchymosis: No Erythema: No Hemosiderin Staining: No Mottled: No Pallor: No Rubor: No Temperature: No Abnormality N/A N/A Tenderness on No N/A N/A Palpation: Wound Preparation: Ulcer Cleansing: N/A N/A Rinsed/Irrigated with Saline Topical Anesthetic Applied: None Treatment Notes Electronic Signature(s) Signed: 05/06/2015 5:54:02 PM By: Montey Hora Entered By: Montey Hora on 05/06/2015 09:52:46 Bradley [name] ([id number]) -------------------------------------------------------------------------------- Bradley [name] Details Patient Name: Bradley Loa A. Date of Service: 05/06/2015 9:15 AM Medical Record Number: [id number] Patient Account Number: [id number] Date of Birth/Sex: November 08, 1945 (69 y.o. Male) Treating RN: Montey Hora Primary Care Physician: [name] Other Clinician: Referring Physician: [name] Treating Physician/Extender: BURNS III, Charlean Sanfilippo in Treatment: 20 Active Inactive Orientation to the Wound Care Program Nursing Diagnoses: Knowledge deficit related to the wound healing center program Goals: Patient/caregiver will verbalize understanding of the [facility]  Program Date Initiated: 12/17/2014 Goal Status: Active Interventions: Provide education on orientation to the wound center Notes: Pressure Nursing Diagnoses: Knowledge deficit related to management of pressures ulcers Potential for impaired tissue integrity related to pressure, friction, moisture, and shear Goals: Patient will remain free from development of additional pressure ulcers Date Initiated: 12/17/2014 Goal Status: Active Interventions: Assess: immobility, friction, shearing, incontinence upon admission and as needed Provide education on pressure ulcers Notes: Wound/Skin Impairment Nursing Diagnoses: Impaired tissue integrity GoalsRYCE, Bradley [name] ([id number]) Ulcer/skin breakdown will have a volume reduction of 30% by week 4 Date Initiated: 12/17/2014 Goal Status: Active Interventions: Assess ulceration(s) every visit Notes: Electronic Signature(s) Signed: 05/06/2015 5:54:02 PM By: Montey Hora Entered By: Montey Hora on 05/06/2015 09:51:38 Bradley [name] ([id number]) -------------------------------------------------------------------------------- Patient/Caregiver Education Details Patient Name: Bradley Loa A. Date of Service: 05/06/2015 9:15 AM Medical Record Number: [id number] Patient Account Number: [id number] Date of Birth/Gender: [birth year] (69 y.o. Male) Treating RN: Montey Hora Primary Care Physician: [name] Other Clinician: Referring Physician: [name] Treating Physician/Extender: BURNS III, Charlean Sanfilippo in Treatment: 20 Education Assessment Education Provided To: Patient Education Topics Provided Wound/Skin Impairment: Handouts: Other: wound care as ordered Methods: Demonstration, Explain/Verbal Responses: State content correctly Electronic Signature(s) Signed: 05/06/2015 3:08:08 PM By: Montey Hora Entered By: Montey Hora on 05/06/2015 15:08:08 Bradley [name]  ([id number]) -------------------------------------------------------------------------------- Wound Assessment Details Patient Name: Bradley Loa A. Date of Service: 05/06/2015 9:15 AM Medical Record Number: [id number] Patient Account Number: [id number] Date of Birth/Sex: October 02, 1945 (69 y.o. Male) Treating RN: Montey Hora Primary Care Physician: [name] Other Clinician: Referring Physician: [name] Treating Physician/Extender: BURNS III, Charlean Sanfilippo in Treatment: 20 Wound Status Wound Number: 3 Primary Pressure Ulcer Etiology: Wound Location: Sacrum - Medial Wound Status: Open Wounding Event: Gradually Appeared Comorbid Hypertension, Rheumatoid Arthritis, Date Acquired: 07/07/2014 History: Paraplegia Weeks Of Treatment: 20 Clustered Wound: No Photos Photo Uploaded By: Montey Hora on 05/06/2015 17:04:23 Wound Measurements Length: (cm) 9.7 Width: (cm) 3.8 Depth: (cm) 0.5 Area: (cm) 28.95 Volume: (cm) 14.475 % Reduction in Area: 56.1% % Reduction in Volume: 26.8% Epithelialization: Medium (34-66%) Tunneling: No Undermining: No Wound Description Classification: Category/Stage III Wound Margin: Distinct, outline attached Exudate Amount: Medium Exudate Type: Serosanguineous Exudate Color: red, brown Foul Odor After Cleansing: No Wound Bed Granulation Amount: Large (67-100%) Exposed Structure Necrotic Amount: Small (1-33%) Fascia Exposed: No Necrotic Quality: Adherent Slough Fat Layer Exposed: No Tendon Exposed: No Bradley [name], Bradley A. ([id number]) Muscle Exposed: No Joint Exposed: No Bone Exposed: No Limited to Skin Breakdown Periwound Skin Texture Texture Color No Abnormalities Noted: No No Abnormalities Noted: No Callus: No Atrophie Blanche: No Crepitus: No  Cyanosis: No Excoriation: No Ecchymosis: No Fluctuance: No Erythema: No Friable: No Hemosiderin Staining: No Induration: No Mottled: No Localized Edema: No Pallor:  No Rash: No Rubor: No Scarring: Yes Temperature / Pain Moisture Temperature: No Abnormality No Abnormalities Noted: No Dry / Scaly: No Maceration: Yes Moist: Yes Wound Preparation Ulcer Cleansing: Rinsed/Irrigated with Saline Topical Anesthetic Applied: None Treatment Notes Wound #3 (Medial Sacrum) 1. Cleansed with: Clean wound with Normal Saline 3. Peri-wound Care: Skin Prep 4. Dressing Applied: Prisma Ag 5. Secondary Dressing Applied Bordered Foam Dressing Dry Gauze Electronic Signature(s) Signed: 05/06/2015 5:54:02 PM By: Montey Hora Entered By: Montey Hora on 05/06/2015 09:47:37 Bradley [name] ([id number]) -------------------------------------------------------------------------------- [place] Details Patient Name: Bradley Loa A. Date of Service: 05/06/2015 9:15 AM Medical Record Number: [id number] Patient Account Number: [id number] Date of Birth/Sex: 1946-05-23 (69 y.o. Male) Treating RN: Montey Hora Primary Care Physician: [name] Other Clinician: Referring Physician: [name] Treating Physician/Extender: BURNS III, Charlean Sanfilippo in Treatment: 20 Vital Signs Time Taken: 09:39 Temperature (F): 98.0 Height (in): 73 Pulse (bpm): 71 Weight (lbs): 205 Respiratory Rate (breaths/min): 18 Body Mass Index (BMI): 27 Blood Pressure (mmHg): 133/45 Reference Range: 80 - 120 mg / dl Electronic Signature(s) Signed: 05/06/2015 5:54:02 PM By: Montey Hora Entered By: Montey Hora on 05/06/2015 09:40:04
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
## 2015-05-08 DIAGNOSIS — G8220 Paraplegia, unspecified: Secondary | ICD-10-CM | POA: Diagnosis not present

## 2015-05-08 DIAGNOSIS — E785 Hyperlipidemia, unspecified: Secondary | ICD-10-CM | POA: Diagnosis not present

## 2015-05-08 DIAGNOSIS — D649 Anemia, unspecified: Secondary | ICD-10-CM | POA: Diagnosis not present

## 2015-05-08 DIAGNOSIS — Z87440 Personal history of urinary (tract) infections: Secondary | ICD-10-CM | POA: Diagnosis not present

## 2015-05-08 DIAGNOSIS — L89153 Pressure ulcer of sacral region, stage 3: Secondary | ICD-10-CM | POA: Diagnosis not present

## 2015-05-08 DIAGNOSIS — I10 Essential (primary) hypertension: Secondary | ICD-10-CM | POA: Diagnosis not present

## 2015-05-09 DIAGNOSIS — D649 Anemia, unspecified: Secondary | ICD-10-CM | POA: Diagnosis not present

## 2015-05-09 DIAGNOSIS — L89153 Pressure ulcer of sacral region, stage 3: Secondary | ICD-10-CM | POA: Diagnosis not present

## 2015-05-09 DIAGNOSIS — E785 Hyperlipidemia, unspecified: Secondary | ICD-10-CM | POA: Diagnosis not present

## 2015-05-09 DIAGNOSIS — Z4800 Encounter for change or removal of nonsurgical wound dressing: Secondary | ICD-10-CM | POA: Diagnosis not present

## 2015-05-09 DIAGNOSIS — I10 Essential (primary) hypertension: Secondary | ICD-10-CM | POA: Diagnosis not present

## 2015-05-09 DIAGNOSIS — G8220 Paraplegia, unspecified: Secondary | ICD-10-CM | POA: Diagnosis not present

## 2015-05-09 DIAGNOSIS — Z87440 Personal history of urinary (tract) infections: Secondary | ICD-10-CM | POA: Diagnosis not present

## 2015-05-11 DIAGNOSIS — I10 Essential (primary) hypertension: Secondary | ICD-10-CM | POA: Diagnosis not present

## 2015-05-11 DIAGNOSIS — S31000S Unspecified open wound of lower back and pelvis without penetration into retroperitoneum, sequela: Secondary | ICD-10-CM | POA: Diagnosis not present

## 2015-05-11 DIAGNOSIS — N400 Enlarged prostate without lower urinary tract symptoms: Secondary | ICD-10-CM | POA: Diagnosis not present

## 2015-05-13 DIAGNOSIS — Z4800 Encounter for change or removal of nonsurgical wound dressing: Secondary | ICD-10-CM | POA: Diagnosis not present

## 2015-05-13 DIAGNOSIS — E785 Hyperlipidemia, unspecified: Secondary | ICD-10-CM | POA: Diagnosis not present

## 2015-05-13 DIAGNOSIS — G8220 Paraplegia, unspecified: Secondary | ICD-10-CM | POA: Diagnosis not present

## 2015-05-13 DIAGNOSIS — L89153 Pressure ulcer of sacral region, stage 3: Secondary | ICD-10-CM | POA: Diagnosis not present

## 2015-05-13 DIAGNOSIS — Z87440 Personal history of urinary (tract) infections: Secondary | ICD-10-CM | POA: Diagnosis not present

## 2015-05-13 DIAGNOSIS — I10 Essential (primary) hypertension: Secondary | ICD-10-CM | POA: Diagnosis not present

## 2015-05-13 DIAGNOSIS — D649 Anemia, unspecified: Secondary | ICD-10-CM | POA: Diagnosis not present

## 2015-05-15 DIAGNOSIS — G8220 Paraplegia, unspecified: Secondary | ICD-10-CM | POA: Diagnosis not present

## 2015-05-15 DIAGNOSIS — D649 Anemia, unspecified: Secondary | ICD-10-CM | POA: Diagnosis not present

## 2015-05-15 DIAGNOSIS — I10 Essential (primary) hypertension: Secondary | ICD-10-CM | POA: Diagnosis not present

## 2015-05-15 DIAGNOSIS — L89153 Pressure ulcer of sacral region, stage 3: Secondary | ICD-10-CM | POA: Diagnosis not present

## 2015-05-20 ENCOUNTER — Ambulatory Visit: Payer: Commercial Managed Care - HMO | Admitting: Surgery

## 2015-05-20 DIAGNOSIS — G8220 Paraplegia, unspecified: Secondary | ICD-10-CM | POA: Diagnosis not present

## 2015-05-20 DIAGNOSIS — Z4800 Encounter for change or removal of nonsurgical wound dressing: Secondary | ICD-10-CM | POA: Diagnosis not present

## 2015-05-20 DIAGNOSIS — E785 Hyperlipidemia, unspecified: Secondary | ICD-10-CM | POA: Diagnosis not present

## 2015-05-20 DIAGNOSIS — D649 Anemia, unspecified: Secondary | ICD-10-CM | POA: Diagnosis not present

## 2015-05-20 DIAGNOSIS — Z87440 Personal history of urinary (tract) infections: Secondary | ICD-10-CM | POA: Diagnosis not present

## 2015-05-20 DIAGNOSIS — L89153 Pressure ulcer of sacral region, stage 3: Secondary | ICD-10-CM | POA: Diagnosis not present

## 2015-05-20 DIAGNOSIS — I10 Essential (primary) hypertension: Secondary | ICD-10-CM | POA: Diagnosis not present

## 2015-05-21 DIAGNOSIS — Z87440 Personal history of urinary (tract) infections: Secondary | ICD-10-CM | POA: Diagnosis not present

## 2015-05-21 DIAGNOSIS — I10 Essential (primary) hypertension: Secondary | ICD-10-CM | POA: Diagnosis not present

## 2015-05-21 DIAGNOSIS — G8220 Paraplegia, unspecified: Secondary | ICD-10-CM | POA: Diagnosis not present

## 2015-05-21 DIAGNOSIS — L89153 Pressure ulcer of sacral region, stage 3: Secondary | ICD-10-CM | POA: Diagnosis not present

## 2015-05-21 DIAGNOSIS — E785 Hyperlipidemia, unspecified: Secondary | ICD-10-CM | POA: Diagnosis not present

## 2015-05-21 DIAGNOSIS — D649 Anemia, unspecified: Secondary | ICD-10-CM | POA: Diagnosis not present

## 2015-05-21 DIAGNOSIS — Z4800 Encounter for change or removal of nonsurgical wound dressing: Secondary | ICD-10-CM | POA: Diagnosis not present

## 2015-05-27 DIAGNOSIS — L89153 Pressure ulcer of sacral region, stage 3: Secondary | ICD-10-CM | POA: Diagnosis not present

## 2015-05-27 DIAGNOSIS — Z4800 Encounter for change or removal of nonsurgical wound dressing: Secondary | ICD-10-CM | POA: Diagnosis not present

## 2015-05-27 DIAGNOSIS — E785 Hyperlipidemia, unspecified: Secondary | ICD-10-CM | POA: Diagnosis not present

## 2015-05-27 DIAGNOSIS — G8220 Paraplegia, unspecified: Secondary | ICD-10-CM | POA: Diagnosis not present

## 2015-05-27 DIAGNOSIS — Z87440 Personal history of urinary (tract) infections: Secondary | ICD-10-CM | POA: Diagnosis not present

## 2015-05-27 DIAGNOSIS — D649 Anemia, unspecified: Secondary | ICD-10-CM | POA: Diagnosis not present

## 2015-05-27 DIAGNOSIS — I10 Essential (primary) hypertension: Secondary | ICD-10-CM | POA: Diagnosis not present

## 2015-06-03 ENCOUNTER — Encounter: Payer: Commercial Managed Care - HMO | Attending: Surgery | Admitting: Surgery

## 2015-06-03 DIAGNOSIS — L89153 Pressure ulcer of sacral region, stage 3: Secondary | ICD-10-CM | POA: Insufficient documentation

## 2015-06-03 DIAGNOSIS — L89313 Pressure ulcer of right buttock, stage 3: Secondary | ICD-10-CM | POA: Diagnosis not present

## 2015-06-03 DIAGNOSIS — G8220 Paraplegia, unspecified: Secondary | ICD-10-CM | POA: Insufficient documentation

## 2015-06-03 DIAGNOSIS — L89323 Pressure ulcer of left buttock, stage 3: Secondary | ICD-10-CM | POA: Diagnosis not present
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
## 2015-06-04 NOTE — Progress Notes (Signed)
TAVITA, RODELA ([id number]) Visit Report for 06/03/2015 Arrival Information Details Patient Name: Bradley [name], Bradley [name]. Date of Service: 06/03/2015 9:15 AM Medical Record Number: [id number] Patient Account Number: [id number] Date of Birth/Sex: 1945/07/06 (69 y.o. Male) Treating RN: Baruch Gouty, RN, BSN, Velva Harman Primary Care Physician: [name] Other Clinician: Referring Physician: [name] Treating Physician/Extender: BURNS III, Charlean Sanfilippo in Treatment: 24 Visit Information History Since Last Visit Added or deleted any medications: No Patient Arrived: Wheel Chair Any new allergies or adverse reactions: No Arrival Time: 09:13 Had a fall or experienced change in No activities of daily living that may affect Accompanied By: self risk of falls: Transfer Assistance: Hoyer Lift Signs or symptoms of abuse/neglect since last No Patient Identification Verified: Yes visito Secondary Verification Process Yes Has Dressing in Place as Prescribed: Yes Completed: Pain Present Now: No Patient Requires Transmission-Based No Precautions: Patient Has Alerts: No Electronic Signature(s) Signed: 06/03/2015 5:10:02 PM By: Regan Lemming BSN, RN Entered By: Regan Lemming on 06/03/2015 09:13:51 Truddie [name] ([id number]) -------------------------------------------------------------------------------- Clinic Level of Care Assessment Details Patient Name: Bradley Loa A. Date of Service: 06/03/2015 9:15 AM Medical Record Number: [id number] Patient Account Number: [id number] Date of Birth/Sex: 1945/08/15 (69 y.o. Male) Treating RN: Montey Hora Primary Care Physician: [name] Other Clinician: Referring Physician: [name] Treating Physician/Extender: BURNS III, Charlean Sanfilippo in Treatment: 24 Clinic Level of Care Assessment Items TOOL 4 Quantity Score []  - Use when only an EandM is performed on FOLLOW-UP visit 0 ASSESSMENTS - Nursing Assessment / Reassessment X -  Reassessment of Co-morbidities (includes updates in patient status) 1 10 X - Reassessment of Adherence to Treatment Plan 1 5 ASSESSMENTS - Wound and Skin Assessment / Reassessment X - Simple Wound Assessment / Reassessment - one wound 1 5 []  - Complex Wound Assessment / Reassessment - multiple wounds 0 []  - Dermatologic / Skin Assessment (not related to wound area) 0 ASSESSMENTS - Focused Assessment []  - Circumferential Edema Measurements - multi extremities 0 []  - Nutritional Assessment / Counseling / Intervention 0 []  - Lower Extremity Assessment (monofilament, tuning fork, pulses) 0 []  - Peripheral Arterial Disease Assessment (using hand held doppler) 0 ASSESSMENTS - Ostomy and/or Continence Assessment and Care []  - Incontinence Assessment and Management 0 []  - Ostomy Care Assessment and Management (repouching, etc.) 0 PROCESS - Coordination of Care X - Simple Patient / Family Education for ongoing care 1 15 []  - Complex (extensive) Patient / Family Education for ongoing care 0 []  - Staff obtains Programmer, systems, Records, Test Results / Process Orders 0 []  - Staff telephones HHA, Nursing Homes / Clarify orders / etc 0 []  - Routine Transfer to another Facility (non-emergent condition) 0 Bradley [name], Bradley [name] ([id number]) []  - Routine Hospital Admission (non-emergent condition) 0 []  - New Admissions / Biomedical engineer / Ordering NPWT, Apligraf, etc. 0 []  - Emergency Hospital Admission (emergent condition) 0 X - Simple Discharge Coordination 1 10 []  - Complex (extensive) Discharge Coordination 0 PROCESS - Special Needs []  - Pediatric / Minor Patient Management 0 []  - Isolation Patient Management 0 []  - Hearing / Language / Visual special needs 0 []  - Assessment of Community assistance (transportation, D/C planning, etc.) 0 []  - Additional assistance / Altered mentation 0 []  - Support Surface(s) Assessment (bed, cushion, seat, etc.) 0 INTERVENTIONS - Wound Cleansing / Measurement X -  Simple Wound Cleansing - one wound 1 5 []  - Complex Wound Cleansing - multiple wounds 0 X - Wound Imaging (photographs - any number of wounds) 1 5 []  -  Wound Tracing (instead of photographs) 0 X - Simple Wound Measurement - one wound 1 5 []  - Complex Wound Measurement - multiple wounds 0 INTERVENTIONS - Wound Dressings X - Small Wound Dressing one or multiple wounds 1 10 []  - Medium Wound Dressing one or multiple wounds 0 []  - Large Wound Dressing one or multiple wounds 0 []  - Application of Medications - topical 0 []  - Application of Medications - injection 0 INTERVENTIONS - Miscellaneous []  - External ear exam 0 Bradley [name], Bradley A. ([id number]) []  - Specimen Collection (cultures, biopsies, blood, body fluids, etc.) 0 []  - Specimen(s) / Culture(s) sent or taken to Lab for analysis 0 []  - Patient Transfer (multiple staff / Harrel Lemon Lift / Similar devices) 0 []  - Simple Staple / Suture removal (25 or less) 0 []  - Complex Staple / Suture removal (26 or more) 0 []  - Hypo / Hyperglycemic Management (close monitor of Blood Glucose) 0 []  - Ankle / Brachial Index (ABI) - do not check if billed separately 0 X - Vital Signs 1 5 Has the patient been seen at the hospital within the last three years: Yes Total Score: 75 Level Of Care: New/Established - Level 2 Electronic Signature(s) Signed: 06/03/2015 5:54:19 PM By: Montey Hora Entered By: Montey Hora on 06/03/2015 17:54:19 Truddie [name] ([id number]) -------------------------------------------------------------------------------- Encounter Discharge Information Details Patient Name: Bradley Loa A. Date of Service: 06/03/2015 9:15 AM Medical Record Number: [id number] Patient Account Number: [id number] Date of Birth/Sex: Apr 16, 1946 (69 y.o. Male) Treating RN: Baruch Gouty, RN, BSN, Velva Harman Primary Care Physician: [name] Other Clinician: Referring Physician: [name] Treating Physician/Extender: BURNS III, Charlean Sanfilippo in  Treatment: 24 Encounter Discharge Information Items Discharge Pain Level: 0 Discharge Condition: Stable Ambulatory Status: Wheelchair Discharge Destination: Home Transportation: Other Accompanied By: self Schedule Follow-up Appointment: No Medication Reconciliation completed and No provided to Patient/Care Dandy Lazaro: Clinical Summary of Care: Electronic Signature(s) Signed: 06/03/2015 5:10:02 PM By: Regan Lemming BSN, RN Entered By: Regan Lemming on 06/03/2015 09:38:55 Truddie [name] ([id number]) -------------------------------------------------------------------------------- Lower Extremity Assessment Details Patient Name: Bradley Loa A. Date of Service: 06/03/2015 9:15 AM Medical Record Number: [id number] Patient Account Number: [id number] Date of Birth/Sex: 03-07-1946 (69 y.o. Male) Treating RN: Baruch Gouty, RN, BSN, Velva Harman Primary Care Physician: [name] Other Clinician: Referring Physician: [name] Treating Physician/Extender: BURNS III, Charlean Sanfilippo in Treatment: 24 Electronic Signature(s) Signed: 06/03/2015 5:10:02 PM By: Regan Lemming BSN, RN Entered By: Regan Lemming on 06/03/2015 Bradley [name] ([id number]) -------------------------------------------------------------------------------- Multi Wound Chart Details Patient Name: Bradley Loa A. Date of Service: 06/03/2015 9:15 AM Medical Record Number: [id number] Patient Account Number: [id number] Date of Birth/Sex: 04-24-1946 (69 y.o. Male) Treating RN: Baruch Gouty, RN, BSN, Velva Harman Primary Care Physician: [name] Other Clinician: Referring Physician: [name] Treating Physician/Extender: BURNS III, Charlean Sanfilippo in Treatment: 24 Vital Signs Height(in): 73 Pulse(bpm): 72 Weight(lbs): 205 Blood Pressure 114/76 (mmHg): Body Mass Index(BMI): 27 Temperature(F): 97.8 Respiratory Rate 18 (breaths/min): Photos: [3:No Photos] [N/A:N/A] Wound Location: [3:Sacrum - Medial]  [N/A:N/A] Wounding Event: [3:Gradually Appeared] [N/A:N/A] Primary Etiology: [3:Pressure Ulcer] [N/A:N/A] Comorbid History: [3:Hypertension, Rheumatoid Arthritis, Paraplegia] [N/A:N/A] Date Acquired: [3:07/07/2014] [N/A:N/A] Weeks of Treatment: [3:24] [N/A:N/A] Wound Status: [3:Open] [N/A:N/A] Measurements L x W x D 10x4x0.3 [N/A:N/A] (cm) Area (cm) : [3:31.416] [N/A:N/A] Volume (cm) : [3:9.425] [N/A:N/A] % Reduction in Area: [3:52.40%] [N/A:N/A] % Reduction in Volume: 52.40% [N/A:N/A] Classification: [3:Category/Stage III] [N/A:N/A] Exudate Amount: [3:Medium] [N/A:N/A] Exudate Type: [3:Serosanguineous] [N/A:N/A] Exudate Color: [3:red, brown] [N/A:N/A] Wound Margin: [3:Distinct, outline attached] [N/A:N/A] Granulation Amount: [3:Large (  67-100%)] [N/A:N/A] Necrotic Amount: [3:Small (1-33%)] [N/A:N/A] Exposed Structures: [3:Fascia: No Fat: No Tendon: No Muscle: No Joint: No Bone: No] [N/A:N/A] Limited to Skin Breakdown Epithelialization: Medium (34-66%) N/A N/A Periwound Skin Texture: Scarring: Yes N/A N/A Edema: No Excoriation: No Induration: No Callus: No Crepitus: No Fluctuance: No Friable: No Rash: No Periwound Skin Maceration: Yes N/A N/A Moisture: Moist: Yes Dry/Scaly: No Periwound Skin Color: Atrophie Blanche: No N/A N/A Cyanosis: No Ecchymosis: No Erythema: No Hemosiderin Staining: No Mottled: No Pallor: No Rubor: No Temperature: No Abnormality N/A N/A Tenderness on No N/A N/A Palpation: Wound Preparation: Ulcer Cleansing: N/A N/A Rinsed/Irrigated with Saline Topical Anesthetic Applied: None Treatment Notes Electronic Signature(s) Signed: 06/03/2015 5:10:02 PM By: Regan Lemming BSN, RN Entered By: Regan Lemming on 06/03/2015 09:26:15 Truddie [name] ([id number]) -------------------------------------------------------------------------------- Multi-Disciplinary Care Plan Details Patient Name: Bradley Loa A. Date of Service: 06/03/2015 9:15  AM Medical Record Number: [id number] Patient Account Number: [id number] Date of Birth/Sex: 1945/11/05 (69 y.o. Male) Treating RN: Baruch Gouty, RN, BSN, Velva Harman Primary Care Physician: [name] Other Clinician: Referring Physician: [name] Treating Physician/Extender: BURNS III, Charlean Sanfilippo in Treatment: 24 Active Inactive Orientation to the Wound Care Program Nursing Diagnoses: Knowledge deficit related to the wound healing center program Goals: Patient/caregiver will verbalize understanding of the [place] Program Date Initiated: 12/17/2014 Goal Status: Active Interventions: Provide education on orientation to the wound center Notes: Pressure Nursing Diagnoses: Knowledge deficit related to management of pressures ulcers Potential for impaired tissue integrity related to pressure, friction, moisture, and shear Goals: Patient will remain free from development of additional pressure ulcers Date Initiated: 12/17/2014 Goal Status: Active Interventions: Assess: immobility, friction, shearing, incontinence upon admission and as needed Provide education on pressure ulcers Notes: Wound/Skin Impairment Nursing Diagnoses: Impaired tissue integrity GoalsROCKEY, Bradley [name] ([id number]) Ulcer/skin breakdown will have a volume reduction of 30% by week 4 Date Initiated: 12/17/2014 Goal Status: Active Interventions: Assess ulceration(s) every visit Notes: Electronic Signature(s) Signed: 06/03/2015 5:10:02 PM By: Regan Lemming BSN, RN Entered By: Regan Lemming on 06/03/2015 09:26:08 Truddie [name] ([id number]) -------------------------------------------------------------------------------- Pain Assessment Details Patient Name: Bradley Loa A. Date of Service: 06/03/2015 9:15 AM Medical Record Number: [id number] Patient Account Number: [id number] Date of Birth/Sex: 09-10-1945 (69 y.o. Male) Treating RN: Baruch Gouty, RN, BSN, Velva Harman Primary Care Physician: [name] Other Clinician: Referring Physician: [name] Treating Physician/Extender: BURNS III, Charlean Sanfilippo in Treatment: 24 Active Problems Location of Pain Severity and Description of Pain Patient Has Paino No Site Locations Pain Management and Medication Current Pain Management: Electronic Signature(s) Signed: 06/03/2015 5:10:02 PM By: Regan Lemming BSN, RN Entered By: Regan Lemming on 06/03/2015 09:13:58 Truddie [name] ([id number]) -------------------------------------------------------------------------------- Patient/Caregiver Education Details Patient Name: Bradley Loa A. Date of Service: 06/03/2015 9:15 AM Medical Record Number: [id number] Patient Account Number: [id number] Date of Birth/Gender: [birth year] (69 y.o. Male) Treating RN: Baruch Gouty, RN, BSN, Velva Harman Primary Care Physician: [name] Other Clinician: Referring Physician: [name] Treating Physician/Extender: BURNS III, Charlean Sanfilippo in Treatment: 24 Education Assessment Education Provided To: Patient Education Topics Provided Pressure: Methods: Explain/Verbal Responses: State content correctly Welcome To [facility]: Methods: Explain/Verbal Responses: State content correctly Electronic Signature(s) Signed: 06/03/2015 5:10:02 PM By: Regan Lemming BSN, RN Entered By: Regan Lemming on 06/03/2015 09:39:09 Truddie [name] ([id number]) -------------------------------------------------------------------------------- Wound Assessment Details Patient Name: Bradley Loa A. Date of Service: 06/03/2015 9:15 AM Medical Record Number: [id number] Patient Account Number: [id number] Date of Birth/Sex: [birth year] (69 y.o. Male) Treating RN: Baruch Gouty, RN, BSN, [employer]  Care Physician: [name] Other Clinician: Referring Physician: [name] Treating Physician/Extender: BURNS III, Charlean Sanfilippo in Treatment: 24 Wound Status Wound Number: 3 Primary Pressure  Ulcer Etiology: Wound Location: Sacrum - Medial Wound Status: Open Wounding Event: Gradually Appeared Comorbid Hypertension, Rheumatoid Arthritis, Date Acquired: 07/07/2014 History: Paraplegia Weeks Of Treatment: 24 Clustered Wound: No Photos Photo Uploaded By: Regan Lemming on 06/03/2015 16:40:45 Wound Measurements Length: (cm) 10 Width: (cm) 4 Depth: (cm) 0.3 Area: (cm) 31.416 Volume: (cm) 9.425 % Reduction in Area: 52.4% % Reduction in Volume: 52.4% Epithelialization: Medium (34-66%) Tunneling: No Wound Description Classification: Category/Stage III Wound Margin: Distinct, outline attached Exudate Amount: Medium Exudate Type: Serosanguineous Exudate Color: red, brown Bradley [name], Bradley A. ([id number]) Foul Odor After Cleansing: No Wound Bed Granulation Amount: Large (67-100%) Exposed Structure Necrotic Amount: Small (1-33%) Fascia Exposed: No Necrotic Quality: Adherent Slough Fat Layer Exposed: No Tendon Exposed: No Muscle Exposed: No Joint Exposed: No Bone Exposed: No Limited to Skin Breakdown Periwound Skin Texture Texture Color No Abnormalities Noted: No No Abnormalities Noted: No Callus: No Atrophie Blanche: No Crepitus: No Cyanosis: No Excoriation: No Ecchymosis: No Fluctuance: No Erythema: No Friable: No Hemosiderin Staining: No Induration: No Mottled: No Localized Edema: No Pallor: No Rash: No Rubor: No Scarring: Yes Temperature / Pain Moisture Temperature: No Abnormality No Abnormalities Noted: No Dry / Scaly: No Maceration: Yes Moist: Yes Wound Preparation Ulcer Cleansing: Rinsed/Irrigated with Saline Topical Anesthetic Applied: None Treatment Notes Wound #3 (Medial Sacrum) 1. Cleansed with: Clean wound with Normal Saline 3. Peri-wound Care: Skin Prep 4. Dressing Applied: Prisma Ag 5. Secondary Dressing Applied Bordered Foam Dressing Electronic Signature(s) Signed: 06/03/2015 5:10:02 PM By: Regan Lemming BSN, RN Bradley [name], Bradley [name]  ([id number]) Entered By: Regan Lemming on 06/03/2015 09:25:57 Truddie [name] ([id number]) -------------------------------------------------------------------------------- Vitals Details Patient Name: Bradley Loa A. Date of Service: 06/03/2015 9:15 AM Medical Record Number: [id number] Patient Account Number: [id number] Date of Birth/Sex: 12/05/1945 (69 y.o. Male) Treating RN: Baruch Gouty, RN, BSN, [place] Primary Care Physician: [name] Other Clinician: Referring Physician: [name] Treating Physician/Extender: BURNS III, Charlean Sanfilippo in Treatment: 24 Vital Signs Time Taken: 09:14 Temperature (F): 97.8 Height (in): 73 Pulse (bpm): 72 Weight (lbs): 205 Respiratory Rate (breaths/min): 18 Body Mass Index (BMI): 27 Blood Pressure (mmHg): 114/76 Reference Range: 80 - 120 mg / dl Electronic Signature(s) Signed: 06/03/2015 5:10:02 PM By: Regan Lemming BSN, RN Entered By: Regan Lemming on 06/03/2015 09:16:02

## 2015-06-05 DIAGNOSIS — D649 Anemia, unspecified: Secondary | ICD-10-CM | POA: Diagnosis not present

## 2015-06-05 DIAGNOSIS — I10 Essential (primary) hypertension: Secondary | ICD-10-CM | POA: Diagnosis not present

## 2015-06-05 DIAGNOSIS — Z4800 Encounter for change or removal of nonsurgical wound dressing: Secondary | ICD-10-CM | POA: Diagnosis not present

## 2015-06-05 DIAGNOSIS — Z87440 Personal history of urinary (tract) infections: Secondary | ICD-10-CM | POA: Diagnosis not present

## 2015-06-05 DIAGNOSIS — G8220 Paraplegia, unspecified: Secondary | ICD-10-CM | POA: Diagnosis not present

## 2015-06-05 DIAGNOSIS — L89153 Pressure ulcer of sacral region, stage 3: Secondary | ICD-10-CM | POA: Diagnosis not present

## 2015-06-05 DIAGNOSIS — E785 Hyperlipidemia, unspecified: Secondary | ICD-10-CM | POA: Diagnosis not present

## 2015-06-10 DIAGNOSIS — Z4800 Encounter for change or removal of nonsurgical wound dressing: Secondary | ICD-10-CM | POA: Diagnosis not present

## 2015-06-10 DIAGNOSIS — E785 Hyperlipidemia, unspecified: Secondary | ICD-10-CM | POA: Diagnosis not present

## 2015-06-10 DIAGNOSIS — D649 Anemia, unspecified: Secondary | ICD-10-CM | POA: Diagnosis not present

## 2015-06-10 DIAGNOSIS — I10 Essential (primary) hypertension: Secondary | ICD-10-CM | POA: Diagnosis not present

## 2015-06-10 DIAGNOSIS — L89153 Pressure ulcer of sacral region, stage 3: Secondary | ICD-10-CM | POA: Diagnosis not present

## 2015-06-10 DIAGNOSIS — G8220 Paraplegia, unspecified: Secondary | ICD-10-CM | POA: Diagnosis not present

## 2015-06-10 DIAGNOSIS — Z87440 Personal history of urinary (tract) infections: Secondary | ICD-10-CM | POA: Diagnosis not present

## 2015-06-17 DIAGNOSIS — D649 Anemia, unspecified: Secondary | ICD-10-CM | POA: Diagnosis not present

## 2015-06-17 DIAGNOSIS — Z4800 Encounter for change or removal of nonsurgical wound dressing: Secondary | ICD-10-CM | POA: Diagnosis not present

## 2015-06-17 DIAGNOSIS — G8220 Paraplegia, unspecified: Secondary | ICD-10-CM | POA: Diagnosis not present

## 2015-06-17 DIAGNOSIS — L89153 Pressure ulcer of sacral region, stage 3: Secondary | ICD-10-CM | POA: Diagnosis not present

## 2015-06-17 DIAGNOSIS — E785 Hyperlipidemia, unspecified: Secondary | ICD-10-CM | POA: Diagnosis not present

## 2015-06-17 DIAGNOSIS — I10 Essential (primary) hypertension: Secondary | ICD-10-CM | POA: Diagnosis not present

## 2015-06-17 DIAGNOSIS — Z87440 Personal history of urinary (tract) infections: Secondary | ICD-10-CM | POA: Diagnosis not present

## 2015-06-23 DIAGNOSIS — Z87440 Personal history of urinary (tract) infections: Secondary | ICD-10-CM | POA: Diagnosis not present

## 2015-06-23 DIAGNOSIS — E785 Hyperlipidemia, unspecified: Secondary | ICD-10-CM | POA: Diagnosis not present

## 2015-06-23 DIAGNOSIS — G8220 Paraplegia, unspecified: Secondary | ICD-10-CM | POA: Diagnosis not present

## 2015-06-23 DIAGNOSIS — L89153 Pressure ulcer of sacral region, stage 3: Secondary | ICD-10-CM | POA: Diagnosis not present

## 2015-06-23 DIAGNOSIS — D649 Anemia, unspecified: Secondary | ICD-10-CM | POA: Diagnosis not present

## 2015-06-23 DIAGNOSIS — I10 Essential (primary) hypertension: Secondary | ICD-10-CM | POA: Diagnosis not present

## 2015-06-23 DIAGNOSIS — Z4800 Encounter for change or removal of nonsurgical wound dressing: Secondary | ICD-10-CM | POA: Diagnosis not present

## 2015-07-01 ENCOUNTER — Encounter: Payer: Commercial Managed Care - HMO | Attending: Surgery | Admitting: Surgery

## 2015-07-01 DIAGNOSIS — L89323 Pressure ulcer of left buttock, stage 3: Secondary | ICD-10-CM | POA: Diagnosis not present

## 2015-07-01 DIAGNOSIS — L89153 Pressure ulcer of sacral region, stage 3: Secondary | ICD-10-CM | POA: Diagnosis not present

## 2015-07-01 DIAGNOSIS — G8220 Paraplegia, unspecified: Secondary | ICD-10-CM | POA: Insufficient documentation

## 2015-07-03 DIAGNOSIS — G8220 Paraplegia, unspecified: Secondary | ICD-10-CM | POA: Diagnosis not present

## 2015-07-03 DIAGNOSIS — L89153 Pressure ulcer of sacral region, stage 3: Secondary | ICD-10-CM | POA: Diagnosis not present

## 2015-07-03 DIAGNOSIS — I10 Essential (primary) hypertension: Secondary | ICD-10-CM | POA: Diagnosis not present

## 2015-07-03 DIAGNOSIS — E785 Hyperlipidemia, unspecified: Secondary | ICD-10-CM | POA: Diagnosis not present

## 2015-07-03 DIAGNOSIS — Z4800 Encounter for change or removal of nonsurgical wound dressing: Secondary | ICD-10-CM | POA: Diagnosis not present

## 2015-07-03 DIAGNOSIS — D649 Anemia, unspecified: Secondary | ICD-10-CM | POA: Diagnosis not present

## 2015-07-03 DIAGNOSIS — Z87440 Personal history of urinary (tract) infections: Secondary | ICD-10-CM | POA: Diagnosis not present

## 2015-07-08 DIAGNOSIS — Z87440 Personal history of urinary (tract) infections: Secondary | ICD-10-CM | POA: Diagnosis not present

## 2015-07-08 DIAGNOSIS — L89153 Pressure ulcer of sacral region, stage 3: Secondary | ICD-10-CM | POA: Diagnosis not present

## 2015-07-08 DIAGNOSIS — D649 Anemia, unspecified: Secondary | ICD-10-CM | POA: Diagnosis not present

## 2015-07-08 DIAGNOSIS — Z4800 Encounter for change or removal of nonsurgical wound dressing: Secondary | ICD-10-CM | POA: Diagnosis not present

## 2015-07-08 DIAGNOSIS — L89323 Pressure ulcer of left buttock, stage 3: Secondary | ICD-10-CM | POA: Diagnosis not present

## 2015-07-08 DIAGNOSIS — G8220 Paraplegia, unspecified: Secondary | ICD-10-CM | POA: Diagnosis not present

## 2015-07-08 DIAGNOSIS — E785 Hyperlipidemia, unspecified: Secondary | ICD-10-CM | POA: Diagnosis not present

## 2015-07-08 DIAGNOSIS — I10 Essential (primary) hypertension: Secondary | ICD-10-CM | POA: Diagnosis not present

## 2015-07-15 DIAGNOSIS — Z87440 Personal history of urinary (tract) infections: Secondary | ICD-10-CM | POA: Diagnosis not present

## 2015-07-15 DIAGNOSIS — E785 Hyperlipidemia, unspecified: Secondary | ICD-10-CM | POA: Diagnosis not present

## 2015-07-15 DIAGNOSIS — N390 Urinary tract infection, site not specified: Secondary | ICD-10-CM | POA: Diagnosis not present

## 2015-07-15 DIAGNOSIS — I10 Essential (primary) hypertension: Secondary | ICD-10-CM | POA: Diagnosis not present

## 2015-07-15 DIAGNOSIS — L89323 Pressure ulcer of left buttock, stage 3: Secondary | ICD-10-CM | POA: Diagnosis not present

## 2015-07-15 DIAGNOSIS — D649 Anemia, unspecified: Secondary | ICD-10-CM | POA: Diagnosis not present

## 2015-07-15 DIAGNOSIS — Z4800 Encounter for change or removal of nonsurgical wound dressing: Secondary | ICD-10-CM | POA: Diagnosis not present

## 2015-07-15 DIAGNOSIS — G8220 Paraplegia, unspecified: Secondary | ICD-10-CM | POA: Diagnosis not present

## 2015-07-15 DIAGNOSIS — L89153 Pressure ulcer of sacral region, stage 3: Secondary | ICD-10-CM | POA: Diagnosis not present

## 2015-07-22 DIAGNOSIS — E785 Hyperlipidemia, unspecified: Secondary | ICD-10-CM | POA: Diagnosis not present

## 2015-07-22 DIAGNOSIS — G8220 Paraplegia, unspecified: Secondary | ICD-10-CM | POA: Diagnosis not present

## 2015-07-22 DIAGNOSIS — D649 Anemia, unspecified: Secondary | ICD-10-CM | POA: Diagnosis not present

## 2015-07-22 DIAGNOSIS — L89323 Pressure ulcer of left buttock, stage 3: Secondary | ICD-10-CM | POA: Diagnosis not present

## 2015-07-22 DIAGNOSIS — Z87440 Personal history of urinary (tract) infections: Secondary | ICD-10-CM | POA: Diagnosis not present

## 2015-07-22 DIAGNOSIS — Z4800 Encounter for change or removal of nonsurgical wound dressing: Secondary | ICD-10-CM | POA: Diagnosis not present

## 2015-07-22 DIAGNOSIS — L89153 Pressure ulcer of sacral region, stage 3: Secondary | ICD-10-CM | POA: Diagnosis not present

## 2015-07-22 DIAGNOSIS — I10 Essential (primary) hypertension: Secondary | ICD-10-CM | POA: Diagnosis not present

## 2015-07-29 ENCOUNTER — Encounter: Payer: Commercial Managed Care - HMO | Attending: Internal Medicine | Admitting: Internal Medicine

## 2015-07-29 DIAGNOSIS — G8220 Paraplegia, unspecified: Secondary | ICD-10-CM | POA: Insufficient documentation

## 2015-07-29 DIAGNOSIS — L89153 Pressure ulcer of sacral region, stage 3: Secondary | ICD-10-CM | POA: Diagnosis not present

## 2015-07-29 DIAGNOSIS — L89323 Pressure ulcer of left buttock, stage 3: Secondary | ICD-10-CM | POA: Diagnosis not present

## 2015-07-30 NOTE — Progress Notes (Signed)
TARUS, ENTIN ([id number]) Visit Report for 07/29/2015 Arrival Information Details Patient Name: Bradley [name], Bradley [name]. Date of Service: 07/29/2015 9:15 AM Medical Record Number: [id number] Patient Account Number: [id number] Date of Birth/Sex: 02-08-1946 (70 y.o. Male) Treating RN: Montey Hora Primary Care Physician: [name] Other Clinician: Referring Physician: [name] Treating Physician/Extender: Tito Dine in Treatment: 52 Visit Information History Since Last Visit Added or deleted any medications: No Patient Arrived: Wheel Chair Any new allergies or adverse reactions: No Arrival Time: 09:37 Had a fall or experienced change in No activities of daily living that may affect Accompanied By: self risk of falls: Transfer Assistance: None Signs or symptoms of abuse/neglect since last No Patient Identification Verified: Yes visito Secondary Verification Process Yes Hospitalized since last visit: No Completed: Pain Present Now: No Patient Requires Transmission-Based No Precautions: Patient Has Alerts: No Electronic Signature(s) Signed: 07/29/2015 5:46:57 PM By: Montey Hora Entered By: Montey Hora on 07/29/2015 09:40:27 [name] -------------------------------------------------------------------------------- Encounter Discharge Information Details Patient Name: Bradley Loa A. Date of Service: 07/29/2015 9:15 AM Medical Record Number: [id number] Patient Account Number: [id number] Date of Birth/Sex: 03-12-1946 (70 y.o. Male) Treating RN: Montey Hora Primary Care Physician: [name] Other Clinician: Referring Physician: [name] Treating Physician/Extender: Tito Dine in Treatment: 61 Encounter Discharge Information Items Discharge Pain Level: 0 Discharge Condition: Stable Ambulatory Status: Wheelchair Discharge Destination: Home Transportation: Private Auto Accompanied By:  self Schedule Follow-up Appointment: Yes Medication Reconciliation completed and provided to Patient/Care No Greydon Betke: Provided on Clinical Summary of Care: 07/29/2015 Form Type Recipient Paper Patient JG Electronic Signature(s) Signed: 07/29/2015 10:50:22 AM By: Montey Hora Previous Signature: 07/29/2015 10:48:09 AM Version By: Ruthine Dose Entered By: Montey Hora on 07/29/2015 10:50:21 [name] -------------------------------------------------------------------------------- Multi Wound Chart Details Patient Name: Bradley Loa A. Date of Service: 07/29/2015 9:15 AM Medical Record Number: [id number] Patient Account Number: [id number] Date of Birth/Sex: 23-Apr-1946 (70 y.o. Male) Treating RN: Montey Hora Primary Care Physician: [name] Other Clinician: Referring Physician: [name] Treating Physician/Extender: Tito Dine in Treatment: 32 Vital Signs Height(in): 73 Pulse(bpm): 67 Weight(lbs): 205 Blood Pressure 122/95 (mmHg): Body Mass Index(BMI): 27 Temperature(F): 97.7 Respiratory Rate 18 (breaths/min): Photos: [3:No Photos] [N/A:N/A] Wound Location: [3:Sacrum - Medial] [N/A:N/A] Wounding Event: [3:Gradually Appeared] [N/A:N/A] Primary Etiology: [3:Pressure Ulcer] [N/A:N/A] Comorbid History: [3:Hypertension, Rheumatoid Arthritis, Paraplegia] [N/A:N/A] Date Acquired: [3:07/07/2014] [N/A:N/A] Weeks of Treatment: [3:32] [N/A:N/A] Wound Status: [3:Open] [N/A:N/A] Measurements L x W x D 7.7x4x0.4 [N/A:N/A] (cm) Area (cm) : [3:24.19] [N/A:N/A] Volume (cm) : [3:9.676] [N/A:N/A] % Reduction in Area: [3:63.30%] [N/A:N/A] % Reduction in Volume: 51.10% [N/A:N/A] Classification: [3:Category/Stage III] [N/A:N/A] Exudate Amount: [3:Medium] [N/A:N/A] Exudate Type: [3:Serosanguineous] [N/A:N/A] Exudate Color: [3:red, brown] [N/A:N/A] Wound Margin: [3:Distinct, outline attached] [N/A:N/A] Granulation Amount:  [3:Large (67-100%)] [N/A:N/A] Necrotic Amount: [3:Small (1-33%)] [N/A:N/A] Exposed Structures: [3:Fascia: No Fat: No Tendon: No Muscle: No Joint: No Bone: No] [N/A:N/A] Limited to Skin Breakdown Epithelialization: Medium (34-66%) N/A N/A Periwound Skin Texture: Scarring: Yes N/A N/A Edema: No Excoriation: No Induration: No Callus: No Crepitus: No Fluctuance: No Friable: No Rash: No Periwound Skin Maceration: Yes N/A N/A Moisture: Moist: Yes Dry/Scaly: No Periwound Skin Color: Atrophie Blanche: No N/A N/A Cyanosis: No Ecchymosis: No Erythema: No Hemosiderin Staining: No Mottled: No Pallor: No Rubor: No Temperature: No Abnormality N/A N/A Tenderness on No N/A N/A Palpation: Wound Preparation: Ulcer Cleansing: N/A N/A Rinsed/Irrigated with Saline Topical Anesthetic Applied: Other: lidocaine 4% Treatment Notes Electronic Signature(s) Signed: 07/29/2015 5:46:57 PM By: Marjory Lies,  Di Kindle Entered By: Montey Hora on 07/29/2015 10:27:51 [name] -------------------------------------------------------------------------------- [place] [facility] Details Patient Name: Bradley [name], Bradley [name]. Date of Service: 07/29/2015 9:15 AM Medical Record Number: [id number] Patient Account Number: [id number] Date of Birth/Sex: 01-27-1946 (70 y.o. Male) Treating RN: Montey Hora Primary Care Physician: [name] Other Clinician: Referring Physician: [name] Treating Physician/Extender: Tito Dine in Treatment: 75 Active Inactive Orientation to the Wound Care Program Nursing Diagnoses: Knowledge deficit related to the wound healing center program Goals: Patient/caregiver will verbalize understanding of the [facility] Program Date Initiated: 12/17/2014 Goal Status: Active Interventions: Provide education on orientation to the wound center Notes: Pressure Nursing Diagnoses: Knowledge deficit related to management of  pressures ulcers Potential for impaired tissue integrity related to pressure, friction, moisture, and shear Goals: Patient will remain free from development of additional pressure ulcers Date Initiated: 12/17/2014 Goal Status: Active Interventions: Assess: immobility, friction, shearing, incontinence upon admission and as needed Provide education on pressure ulcers Notes: Wound/Skin Impairment Nursing Diagnoses: Impaired tissue integrity GoalsGWYNN, Bradley [name] ([id number]) Ulcer/skin breakdown will have a volume reduction of 30% by week 4 Date Initiated: 12/17/2014 Goal Status: Active Interventions: Assess ulceration(s) every visit Notes: Electronic Signature(s) Signed: 07/29/2015 5:46:57 PM By: Montey Hora Entered By: Montey Hora on 07/29/2015 10:27:42 [name] -------------------------------------------------------------------------------- Patient/Caregiver Education Details Patient Name: Bradley Loa A. Date of Service: 07/29/2015 9:15 AM Medical Record Number: [id number] Patient Account Number: [id number] Date of Birth/Gender: [birth year] (70 y.o. Male) Treating RN: Montey Hora Primary Care Physician: [name] Other Clinician: Referring Physician: [name] Treating Physician/Extender: Tito Dine in Treatment: 86 Education Assessment Education Provided To: Patient Education Topics Provided Pressure: Handouts: Pressure Ulcers: Care and Offloading Methods: Explain/Verbal Responses: State content correctly Electronic Signature(s) Signed: 07/29/2015 10:50:46 AM By: Montey Hora Entered By: Montey Hora on 07/29/2015 10:50:46 [name] -------------------------------------------------------------------------------- Wound Assessment Details Patient Name: Bradley Loa A. Date of Service: 07/29/2015 9:15 AM Medical Record Number: [id number] Patient Account Number: [id number] Date of  Birth/Sex: [birth year] (70 y.o. Male) Treating RN: Montey Hora Primary Care Physician: [name] Other Clinician: Referring Physician: [name] Treating Physician/Extender: Ricard Dillon Weeks in Treatment: 32 Wound Status Wound Number: 3 Primary Pressure Ulcer Etiology: Wound Location: Sacrum - Medial Wound Status: Open Wounding Event: Gradually Appeared Comorbid Hypertension, Rheumatoid Arthritis, Date Acquired: 07/07/2014 History: Paraplegia Weeks Of Treatment: 32 Clustered Wound: No Photos Photo Uploaded By: Montey Hora on 07/29/2015 16:55:36 Wound Measurements Length: (cm) 7.7 Width: (cm) 4 Depth: (cm) 0.4 Area: (cm) 24.19 Volume: (cm) 9.676 % Reduction in Area: 63.3% % Reduction in Volume: 51.1% Epithelialization: Medium (34-66%) Tunneling: No Undermining: No Wound Description Classification: Category/Stage III Wound Margin: Distinct, outline attached Exudate Amount: Medium Exudate Type: Serosanguineous Exudate Color: red, brown Foul Odor After Cleansing: No Wound Bed Granulation Amount: Large (67-100%) Exposed Structure Necrotic Amount: Small (1-33%) Fascia Exposed: No Necrotic Quality: Adherent Slough Fat Layer Exposed: No Tendon Exposed: No Bradley [name], Bradley A. ([id number]) Muscle Exposed: No Joint Exposed: No Bone Exposed: No Limited to Skin Breakdown Periwound Skin Texture Texture Color No Abnormalities Noted: No No Abnormalities Noted: No Callus: No Atrophie Blanche: No Crepitus: No Cyanosis: No Excoriation: No Ecchymosis: No Fluctuance: No Erythema: No Friable: No Hemosiderin Staining: No Induration: No Mottled: No Localized Edema: No Pallor: No Rash: No Rubor: No Scarring: Yes Temperature / Pain Moisture Temperature: No Abnormality No Abnormalities Noted: No Dry / Scaly: No Maceration: Yes Moist: Yes Wound Preparation Ulcer Cleansing: Rinsed/Irrigated  with Saline Topical Anesthetic Applied: Other:  lidocaine 4%, Treatment Notes Wound #3 (Medial Sacrum) 1. Cleansed with: Clean wound with Normal Saline 2. Anesthetic Topical Lidocaine 4% cream to wound bed prior to debridement 4. Dressing Applied: Hydrafera Blue 5. Secondary Dressing Applied Bordered Foam Dressing Electronic Signature(s) Signed: 07/29/2015 5:46:57 PM By: Montey Hora Entered By: Montey Hora on 07/29/2015 09:53:05 [name] -------------------------------------------------------------------------------- [place] Details Patient Name: Bradley Loa A. Date of Service: 07/29/2015 9:15 AM Medical Record Number: [id number] Patient Account Number: [id number] Date of Birth/Sex: [birth year] (70 y.o. Male) Treating RN: Montey Hora Primary Care Physician: [name] Other Clinician: Referring Physician: [name] Treating Physician/Extender: Tito Dine in Treatment: 32 Vital Signs Time Taken: 09:39 Temperature (F): 97.7 Height (in): 73 Pulse (bpm): 67 Weight (lbs): 205 Respiratory Rate (breaths/min): 18 Body Mass Index (BMI): 27 Blood Pressure (mmHg): 122/95 Reference Range: 80 - 120 mg / dl Electronic Signature(s) Signed: 07/29/2015 5:46:57 PM By: Montey Hora Entered By: Montey Hora on 07/29/2015 09:39:46
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
## 2015-07-31 DIAGNOSIS — Z4800 Encounter for change or removal of nonsurgical wound dressing: Secondary | ICD-10-CM | POA: Diagnosis not present

## 2015-07-31 DIAGNOSIS — D649 Anemia, unspecified: Secondary | ICD-10-CM | POA: Diagnosis not present

## 2015-07-31 DIAGNOSIS — E785 Hyperlipidemia, unspecified: Secondary | ICD-10-CM | POA: Diagnosis not present

## 2015-07-31 DIAGNOSIS — L89323 Pressure ulcer of left buttock, stage 3: Secondary | ICD-10-CM | POA: Diagnosis not present

## 2015-07-31 DIAGNOSIS — I10 Essential (primary) hypertension: Secondary | ICD-10-CM | POA: Diagnosis not present

## 2015-07-31 DIAGNOSIS — Z87440 Personal history of urinary (tract) infections: Secondary | ICD-10-CM | POA: Diagnosis not present

## 2015-07-31 DIAGNOSIS — L89153 Pressure ulcer of sacral region, stage 3: Secondary | ICD-10-CM | POA: Diagnosis not present

## 2015-07-31 DIAGNOSIS — G8220 Paraplegia, unspecified: Secondary | ICD-10-CM | POA: Diagnosis not present

## 2015-08-05 DIAGNOSIS — G8220 Paraplegia, unspecified: Secondary | ICD-10-CM | POA: Diagnosis not present

## 2015-08-05 DIAGNOSIS — Z4800 Encounter for change or removal of nonsurgical wound dressing: Secondary | ICD-10-CM | POA: Diagnosis not present

## 2015-08-05 DIAGNOSIS — Z87440 Personal history of urinary (tract) infections: Secondary | ICD-10-CM | POA: Diagnosis not present

## 2015-08-05 DIAGNOSIS — L89153 Pressure ulcer of sacral region, stage 3: Secondary | ICD-10-CM | POA: Diagnosis not present

## 2015-08-05 DIAGNOSIS — E785 Hyperlipidemia, unspecified: Secondary | ICD-10-CM | POA: Diagnosis not present

## 2015-08-05 DIAGNOSIS — I10 Essential (primary) hypertension: Secondary | ICD-10-CM | POA: Diagnosis not present

## 2015-08-05 DIAGNOSIS — D649 Anemia, unspecified: Secondary | ICD-10-CM | POA: Diagnosis not present

## 2015-08-05 DIAGNOSIS — L89323 Pressure ulcer of left buttock, stage 3: Secondary | ICD-10-CM | POA: Diagnosis not present

## 2015-08-12 ENCOUNTER — Encounter: Payer: Commercial Managed Care - HMO | Attending: Internal Medicine | Admitting: Internal Medicine

## 2015-08-12 DIAGNOSIS — L89323 Pressure ulcer of left buttock, stage 3: Secondary | ICD-10-CM | POA: Diagnosis not present

## 2015-08-12 DIAGNOSIS — G8220 Paraplegia, unspecified: Secondary | ICD-10-CM | POA: Insufficient documentation

## 2015-08-12 DIAGNOSIS — L89153 Pressure ulcer of sacral region, stage 3: Secondary | ICD-10-CM | POA: Insufficient documentation
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
## 2015-08-13 NOTE — Progress Notes (Addendum)
Bradley [name], Bradley [name] ([id number]) Visit Report for 08/12/2015 Arrival Information Details Patient Name: Bradley [name], Bradley [name]. Date of Service: 08/12/2015 9:15 AM Medical Record Number: [id number] Patient Account Number: [id number] Date of Birth/Sex: 12-28-1945 (70 y.o. Male) Treating RN: Montey Hora Primary Care Physician: [name] Other Clinician: Referring Physician: [name] Treating Physician/Extender: Tito Dine in Treatment: 9 Visit Information History Since Last Visit Added or deleted any medications: No Patient Arrived: Wheel Chair Any new allergies or adverse reactions: No Arrival Time: 09:50 Had a fall or experienced change in No activities of daily living that may affect Accompanied By: self risk of falls: Transfer Assistance: Hoyer Lift Signs or symptoms of abuse/neglect since last No Patient Identification Verified: Yes visito Secondary Verification Process Yes Hospitalized since last visit: No Completed: Pain Present Now: No Patient Requires Transmission-Based No Precautions: Patient Has Alerts: No Electronic Signature(s) Signed: 08/12/2015 4:37:06 PM By: Montey Hora Entered By: Montey Hora on 08/12/2015 09:50:37 [name] -------------------------------------------------------------------------------- Complex / Palliative Patient Assessment Details Patient Name: Bradley Loa A. Date of Service: 08/12/2015 9:15 AM Medical Record Number: [id number] Patient Account Number: [id number] Date of Birth/Sex: 22-Aug-1945 (70 y.o. Male) Treating RN: Montey Hora Primary Care Physician: [name] Other Clinician: Referring Physician: [name] Treating Physician/Extender: Tito Dine in Treatment: 109 Palliative Management Criteria Complex Wound Management Criteria The patient has limited personal or cognitive resources, or has no access to appropriate ongoing care providers, such that it is  unreasonable to expect a level of compliance with prescribed advanced wound care treatments necessary to achieve desired healing outcomes. (Must be documented in physician progress notes.) Care Approach Wound Care Plan: Complex Wound Management Electronic Signature(s) Signed: 09/04/2015 5:18:20 PM By: Montey Hora Signed: 10/13/2015 7:26:03 AM By: Linton Ham MD Entered By: Montey Hora on 09/04/2015 17:18:20 [name] -------------------------------------------------------------------------------- Encounter Discharge Information Details Patient Name: Bradley [name], Bradley A. Date of Service: 08/12/2015 9:15 AM Medical Record Number: [id number] Patient Account Number: [id number] Date of Birth/Sex: 02/24/1946 (71 y.o. Male) Treating RN: Montey Hora Primary Care Physician: [name] Other Clinician: Referring Physician: [name] Treating Physician/Extender: Tito Dine in Treatment: 40 Encounter Discharge Information Items Discharge Pain Level: 0 Discharge Condition: Stable Ambulatory Status: Wheelchair Discharge Destination: Home Transportation: Private Auto Accompanied By: self Schedule Follow-up Appointment: Yes Medication Reconciliation completed No and provided to Patient/Care Kilynn Fitzsimmons: Provided on Clinical Summary of Care: 08/12/2015 Form Type Recipient Paper Patient JG Electronic Signature(s) Signed: 08/12/2015 10:57:11 AM By: Sharon Mt Entered By: Sharon Mt on 08/12/2015 10:57:10 [name] -------------------------------------------------------------------------------- Multi Wound Chart Details Patient Name: Bradley Loa A. Date of Service: 08/12/2015 9:15 AM Medical Record Number: [id number] Patient Account Number: [id number] Date of Birth/Sex: 01/25/1946 (70 y.o. Male) Treating RN: Montey Hora Primary Care Physician: [name] Other Clinician: Referring Physician: [name] Treating Physician/Extender: Tito Dine in Treatment: 34 Vital Signs Height(in): 73 Pulse(bpm): 68 Weight(lbs): 205 Blood Pressure 132/61 (mmHg): Body Mass Index(BMI): 27 Temperature(F): Respiratory Rate 18 (breaths/min): Photos: [3:No Photos] [N/A:N/A] Wound Location: [3:Sacrum - Medial] [N/A:N/A] Wounding Event: [3:Gradually Appeared] [N/A:N/A] Primary Etiology: [3:Pressure Ulcer] [N/A:N/A] Comorbid History: [3:Hypertension, Rheumatoid Arthritis, Paraplegia] [N/A:N/A] Date Acquired: [3:07/07/2014] [N/A:N/A] Weeks of Treatment: [3:34] [N/A:N/A] Wound Status: [3:Open] [N/A:N/A] Measurements L x W x D 8x4.2x0.3 [N/A:N/A] (cm) Area (cm) : [id number] [N/A:N/A] Volume (cm) : [3:7.917] [N/A:N/A] % Reduction in Area: [3:60.00%] [N/A:N/A] % Reduction in Volume: 60.00% [N/A:N/A] Classification: [3:Category/Stage III] [N/A:N/A] Exudate Amount: [3:Medium] [N/A:N/A] Exudate Type: [3:Serosanguineous] [N/A:N/A]  Exudate Color: [3:red, brown] [N/A:N/A] Wound Margin: [3:Distinct, outline attached] [N/A:N/A] Granulation Amount: [3:Large (67-100%)] [N/A:N/A] Necrotic Amount: [3:Small (1-33%)] [N/A:N/A] Exposed Structures: [3:Fascia: No Fat: No Tendon: No Muscle: No Joint: No Bone: No] [N/A:N/A] Limited to Skin Breakdown Epithelialization: Medium (34-66%) N/A N/A Periwound Skin Texture: Scarring: Yes N/A N/A Edema: No Excoriation: No Induration: No Callus: No Crepitus: No Fluctuance: No Friable: No Rash: No Periwound Skin Maceration: Yes N/A N/A Moisture: Moist: Yes Dry/Scaly: No Periwound Skin Color: Atrophie Blanche: No N/A N/A Cyanosis: No Ecchymosis: No Erythema: No Hemosiderin Staining: No Mottled: No Pallor: No Rubor: No Temperature: No Abnormality N/A N/A Tenderness on No N/A N/A Palpation: Wound Preparation: Ulcer Cleansing: N/A N/A Rinsed/Irrigated with Saline Topical Anesthetic Applied: Other: lidocaine 4% Treatment  Notes Electronic Signature(s) Signed: 08/12/2015 4:37:06 PM By: Montey Hora Entered By: Montey Hora on 08/12/2015 10:29:47 [name] -------------------------------------------------------------------------------- [place] Details Patient Name: Bradley [name], Bradley A. Date of Service: 08/12/2015 9:15 AM Medical Record Number: [id number] Patient Account Number: [id number] Date of Birth/Sex: [birth year] (70 y.o. Male) Treating RN: Montey Hora Primary Care Physician: [name] Other Clinician: Referring Physician: [name] Treating Physician/Extender: Tito Dine in Treatment: 65 Active Inactive Orientation to the Wound Care Program Nursing Diagnoses: Knowledge deficit related to the wound healing center program Goals: Patient/caregiver will verbalize understanding of the [facility] Date Initiated: 12/17/2014 Goal Status: Active Interventions: Provide education on orientation to the wound center Notes: Pressure Nursing Diagnoses: Knowledge deficit related to management of pressures ulcers Potential for impaired tissue integrity related to pressure, friction, moisture, and shear Goals: Patient will remain free from development of additional pressure ulcers Date Initiated: 12/17/2014 Goal Status: Active Interventions: Assess: immobility, friction, shearing, incontinence upon admission and as needed Provide education on pressure ulcers Notes: Wound/Skin Impairment Nursing Diagnoses: Impaired tissue integrity GoalsSAIFAN, Bradley [name] ([id number]) Ulcer/skin breakdown will have a volume reduction of 30% by week 4 Date Initiated: 12/17/2014 Goal Status: Active Interventions: Assess ulceration(s) every visit Notes: Electronic Signature(s) Signed: 08/12/2015 4:37:06 PM By: Montey Hora Entered By: Montey Hora on 08/12/2015 10:29:39 [name]  ([id number]) -------------------------------------------------------------------------------- Patient/Caregiver Education Details Patient Name: Bradley Loa A. Date of Service: 08/12/2015 9:15 AM Medical Record Number: [id number] Patient Account Number: [id number] Date of Birth/Gender: 01-26-1946 (70 y.o. Male) Treating RN: Montey Hora Primary Care Physician: [name] Other Clinician: Referring Physician: [name] Treating Physician/Extender: Tito Dine in Treatment: 33 Education Assessment Education Provided To: Patient Education Topics Provided Wound/Skin Impairment: Handouts: Other: wound care to continue as ordered Methods: Demonstration, Explain/Verbal Responses: State content correctly Electronic Signature(s) Signed: 08/12/2015 4:37:06 PM By: Montey Hora Entered By: Montey Hora on 08/12/2015 10:41:47 [name] -------------------------------------------------------------------------------- Wound Assessment Details Patient Name: Bradley Loa A. Date of Service: 08/12/2015 9:15 AM Medical Record Number: [id number] Patient Account Number: [id number] Date of Birth/Sex: [birth year] (70 y.o. Male) Treating RN: Montey Hora Primary Care Physician: [name] Other Clinician: Referring Physician: [name] Treating Physician/Extender: Ricard Dillon Weeks in Treatment: 34 Wound Status Wound Number: 3 Primary Pressure Ulcer Etiology: Wound Location: Sacrum - Medial Wound Status: Open Wounding Event: Gradually Appeared Comorbid Hypertension, Rheumatoid Arthritis, Date Acquired: 07/07/2014 History: Paraplegia Weeks Of Treatment: 34 Clustered Wound: No Photos Photo Uploaded By: Montey Hora on 08/12/2015 15:51:11 Wound Measurements Length: (cm) 8 Width: (cm) 4.2 Depth: (cm) 0.3 Area: (cm) 26.389 Volume: (cm) 7.917 % Reduction in Area: 60% % Reduction in Volume: 60% Epithelialization:  Medium (34-66%) Tunneling: No Undermining:  No Wound Description Classification: Category/Stage III Wound Margin: Distinct, outline attached Exudate Amount: Medium Exudate Type: Serosanguineous Exudate Color: red, brown Foul Odor After Cleansing: No Wound Bed Granulation Amount: Large (67-100%) Exposed Structure Necrotic Amount: Small (1-33%) Fascia Exposed: No Necrotic Quality: Adherent Slough Fat Layer Exposed: No Tendon Exposed: No Bradley [name], Bradley A. ([id number]) Muscle Exposed: No Joint Exposed: No Bone Exposed: No Limited to Skin Breakdown Periwound Skin Texture Texture Color No Abnormalities Noted: No No Abnormalities Noted: No Callus: No Atrophie Blanche: No Crepitus: No Cyanosis: No Excoriation: No Ecchymosis: No Fluctuance: No Erythema: No Friable: No Hemosiderin Staining: No Induration: No Mottled: No Localized Edema: No Pallor: No Rash: No Rubor: No Scarring: Yes Temperature / Pain Moisture Temperature: No Abnormality No Abnormalities Noted: No Dry / Scaly: No Maceration: Yes Moist: Yes Wound Preparation Ulcer Cleansing: Rinsed/Irrigated with Saline Topical Anesthetic Applied: Other: lidocaine 4%, Electronic Signature(s) Signed: 08/12/2015 4:37:06 PM By: Montey Hora Entered By: Montey Hora on 08/12/2015 10:07:42 [name] -------------------------------------------------------------------------------- Vitals Details Patient Name: Bradley Loa A. Date of Service: 08/12/2015 9:15 AM Medical Record Number: [id number] Patient Account Number: [id number] Date of Birth/Sex: [birth year] (70 y.o. Male) Treating RN: Montey Hora Primary Care Physician: [name] Other Clinician: Referring Physician: [name] Treating Physician/Extender: Tito Dine in Treatment: 34 Vital Signs Time Taken: 09:54 Pulse (bpm): 68 Height (in): 73 Respiratory Rate (breaths/min): 18 Weight (lbs): 205 Blood Pressure  (mmHg): 132/61 Body Mass Index (BMI): 27 Reference Range: 80 - 120 mg / dl Electronic Signature(s) Signed: 08/12/2015 4:37:06 PM By: Montey Hora Entered By: Montey Hora on 08/12/2015 09:54:53

## 2015-08-14 DIAGNOSIS — G8220 Paraplegia, unspecified: Secondary | ICD-10-CM | POA: Diagnosis not present

## 2015-08-14 DIAGNOSIS — L89323 Pressure ulcer of left buttock, stage 3: Secondary | ICD-10-CM | POA: Diagnosis not present

## 2015-08-14 DIAGNOSIS — I10 Essential (primary) hypertension: Secondary | ICD-10-CM | POA: Diagnosis not present

## 2015-08-14 DIAGNOSIS — L89153 Pressure ulcer of sacral region, stage 3: Secondary | ICD-10-CM | POA: Diagnosis not present

## 2015-08-14 DIAGNOSIS — Z87440 Personal history of urinary (tract) infections: Secondary | ICD-10-CM | POA: Diagnosis not present

## 2015-08-14 DIAGNOSIS — E785 Hyperlipidemia, unspecified: Secondary | ICD-10-CM | POA: Diagnosis not present

## 2015-08-14 DIAGNOSIS — D649 Anemia, unspecified: Secondary | ICD-10-CM | POA: Diagnosis not present

## 2015-08-14 DIAGNOSIS — Z4800 Encounter for change or removal of nonsurgical wound dressing: Secondary | ICD-10-CM | POA: Diagnosis not present

## 2015-08-19 DIAGNOSIS — Z87440 Personal history of urinary (tract) infections: Secondary | ICD-10-CM | POA: Diagnosis not present

## 2015-08-19 DIAGNOSIS — I10 Essential (primary) hypertension: Secondary | ICD-10-CM | POA: Diagnosis not present

## 2015-08-19 DIAGNOSIS — Z4800 Encounter for change or removal of nonsurgical wound dressing: Secondary | ICD-10-CM | POA: Diagnosis not present

## 2015-08-19 DIAGNOSIS — G8220 Paraplegia, unspecified: Secondary | ICD-10-CM | POA: Diagnosis not present

## 2015-08-19 DIAGNOSIS — D649 Anemia, unspecified: Secondary | ICD-10-CM | POA: Diagnosis not present

## 2015-08-19 DIAGNOSIS — L89153 Pressure ulcer of sacral region, stage 3: Secondary | ICD-10-CM | POA: Diagnosis not present

## 2015-08-19 DIAGNOSIS — E785 Hyperlipidemia, unspecified: Secondary | ICD-10-CM | POA: Diagnosis not present

## 2015-08-19 DIAGNOSIS — L89323 Pressure ulcer of left buttock, stage 3: Secondary | ICD-10-CM | POA: Diagnosis not present

## 2015-08-24 DIAGNOSIS — L89153 Pressure ulcer of sacral region, stage 3: Secondary | ICD-10-CM | POA: Diagnosis not present

## 2015-08-24 DIAGNOSIS — Z4800 Encounter for change or removal of nonsurgical wound dressing: Secondary | ICD-10-CM | POA: Diagnosis not present

## 2015-08-24 DIAGNOSIS — Z87440 Personal history of urinary (tract) infections: Secondary | ICD-10-CM | POA: Diagnosis not present

## 2015-08-24 DIAGNOSIS — G8220 Paraplegia, unspecified: Secondary | ICD-10-CM | POA: Diagnosis not present

## 2015-08-24 DIAGNOSIS — I10 Essential (primary) hypertension: Secondary | ICD-10-CM | POA: Diagnosis not present

## 2015-08-24 DIAGNOSIS — E119 Type 2 diabetes mellitus without complications: Secondary | ICD-10-CM | POA: Diagnosis not present

## 2015-08-24 DIAGNOSIS — E785 Hyperlipidemia, unspecified: Secondary | ICD-10-CM | POA: Diagnosis not present

## 2015-08-24 DIAGNOSIS — D649 Anemia, unspecified: Secondary | ICD-10-CM | POA: Diagnosis not present

## 2015-08-24 DIAGNOSIS — L89323 Pressure ulcer of left buttock, stage 3: Secondary | ICD-10-CM | POA: Diagnosis not present

## 2015-08-24 DIAGNOSIS — N390 Urinary tract infection, site not specified: Secondary | ICD-10-CM | POA: Diagnosis not present

## 2015-08-24 DIAGNOSIS — R7303 Prediabetes: Secondary | ICD-10-CM | POA: Diagnosis not present

## 2015-08-25 DIAGNOSIS — R031 Nonspecific low blood-pressure reading: Secondary | ICD-10-CM | POA: Diagnosis not present

## 2015-08-25 DIAGNOSIS — J00 Acute nasopharyngitis [common cold]: Secondary | ICD-10-CM | POA: Diagnosis not present

## 2015-08-25 DIAGNOSIS — N390 Urinary tract infection, site not specified: Secondary | ICD-10-CM | POA: Diagnosis not present

## 2015-08-25 DIAGNOSIS — E785 Hyperlipidemia, unspecified: Secondary | ICD-10-CM | POA: Diagnosis not present

## 2015-08-25 DIAGNOSIS — Z4800 Encounter for change or removal of nonsurgical wound dressing: Secondary | ICD-10-CM | POA: Diagnosis not present

## 2015-08-25 DIAGNOSIS — I10 Essential (primary) hypertension: Secondary | ICD-10-CM | POA: Diagnosis not present

## 2015-08-25 DIAGNOSIS — G8220 Paraplegia, unspecified: Secondary | ICD-10-CM | POA: Diagnosis not present

## 2015-08-25 DIAGNOSIS — L89153 Pressure ulcer of sacral region, stage 3: Secondary | ICD-10-CM | POA: Diagnosis not present

## 2015-08-25 DIAGNOSIS — D649 Anemia, unspecified: Secondary | ICD-10-CM | POA: Diagnosis not present

## 2015-08-25 DIAGNOSIS — Z87440 Personal history of urinary (tract) infections: Secondary | ICD-10-CM | POA: Diagnosis not present

## 2015-08-25 DIAGNOSIS — L89323 Pressure ulcer of left buttock, stage 3: Secondary | ICD-10-CM | POA: Diagnosis not present

## 2015-08-26 ENCOUNTER — Ambulatory Visit: Payer: Commercial Managed Care - HMO | Admitting: Internal Medicine

## 2015-08-26 DIAGNOSIS — Z4800 Encounter for change or removal of nonsurgical wound dressing: Secondary | ICD-10-CM | POA: Diagnosis not present

## 2015-08-26 DIAGNOSIS — Z87440 Personal history of urinary (tract) infections: Secondary | ICD-10-CM | POA: Diagnosis not present

## 2015-08-26 DIAGNOSIS — L89323 Pressure ulcer of left buttock, stage 3: Secondary | ICD-10-CM | POA: Diagnosis not present

## 2015-08-26 DIAGNOSIS — L89153 Pressure ulcer of sacral region, stage 3: Secondary | ICD-10-CM | POA: Diagnosis not present

## 2015-08-26 DIAGNOSIS — G8220 Paraplegia, unspecified: Secondary | ICD-10-CM | POA: Diagnosis not present

## 2015-08-26 DIAGNOSIS — I10 Essential (primary) hypertension: Secondary | ICD-10-CM | POA: Diagnosis not present

## 2015-08-26 DIAGNOSIS — E785 Hyperlipidemia, unspecified: Secondary | ICD-10-CM | POA: Diagnosis not present

## 2015-08-26 DIAGNOSIS — D649 Anemia, unspecified: Secondary | ICD-10-CM | POA: Diagnosis not present

## 2015-08-27 DIAGNOSIS — G8220 Paraplegia, unspecified: Secondary | ICD-10-CM | POA: Diagnosis not present

## 2015-08-27 DIAGNOSIS — D649 Anemia, unspecified: Secondary | ICD-10-CM | POA: Diagnosis not present

## 2015-08-27 DIAGNOSIS — Z87440 Personal history of urinary (tract) infections: Secondary | ICD-10-CM | POA: Diagnosis not present

## 2015-08-27 DIAGNOSIS — E785 Hyperlipidemia, unspecified: Secondary | ICD-10-CM | POA: Diagnosis not present

## 2015-08-27 DIAGNOSIS — Z4800 Encounter for change or removal of nonsurgical wound dressing: Secondary | ICD-10-CM | POA: Diagnosis not present

## 2015-08-27 DIAGNOSIS — I10 Essential (primary) hypertension: Secondary | ICD-10-CM | POA: Diagnosis not present

## 2015-08-27 DIAGNOSIS — L89153 Pressure ulcer of sacral region, stage 3: Secondary | ICD-10-CM | POA: Diagnosis not present

## 2015-08-27 DIAGNOSIS — L89323 Pressure ulcer of left buttock, stage 3: Secondary | ICD-10-CM | POA: Diagnosis not present

## 2015-08-28 DIAGNOSIS — L8992 Pressure ulcer of unspecified site, stage 2: Secondary | ICD-10-CM | POA: Diagnosis not present

## 2015-08-31 DIAGNOSIS — G8220 Paraplegia, unspecified: Secondary | ICD-10-CM | POA: Diagnosis not present

## 2015-08-31 DIAGNOSIS — I10 Essential (primary) hypertension: Secondary | ICD-10-CM | POA: Diagnosis not present

## 2015-08-31 DIAGNOSIS — Z4800 Encounter for change or removal of nonsurgical wound dressing: Secondary | ICD-10-CM | POA: Diagnosis not present

## 2015-08-31 DIAGNOSIS — E785 Hyperlipidemia, unspecified: Secondary | ICD-10-CM | POA: Diagnosis not present

## 2015-08-31 DIAGNOSIS — D649 Anemia, unspecified: Secondary | ICD-10-CM | POA: Diagnosis not present

## 2015-08-31 DIAGNOSIS — Z87440 Personal history of urinary (tract) infections: Secondary | ICD-10-CM | POA: Diagnosis not present

## 2015-08-31 DIAGNOSIS — L89323 Pressure ulcer of left buttock, stage 3: Secondary | ICD-10-CM | POA: Diagnosis not present

## 2015-08-31 DIAGNOSIS — L89153 Pressure ulcer of sacral region, stage 3: Secondary | ICD-10-CM | POA: Diagnosis not present

## 2015-09-02 DIAGNOSIS — E785 Hyperlipidemia, unspecified: Secondary | ICD-10-CM | POA: Diagnosis not present

## 2015-09-02 DIAGNOSIS — I10 Essential (primary) hypertension: Secondary | ICD-10-CM | POA: Diagnosis not present

## 2015-09-02 DIAGNOSIS — D649 Anemia, unspecified: Secondary | ICD-10-CM | POA: Diagnosis not present

## 2015-09-02 DIAGNOSIS — Z4800 Encounter for change or removal of nonsurgical wound dressing: Secondary | ICD-10-CM | POA: Diagnosis not present

## 2015-09-02 DIAGNOSIS — L89323 Pressure ulcer of left buttock, stage 3: Secondary | ICD-10-CM | POA: Diagnosis not present

## 2015-09-02 DIAGNOSIS — L89153 Pressure ulcer of sacral region, stage 3: Secondary | ICD-10-CM | POA: Diagnosis not present

## 2015-09-02 DIAGNOSIS — G8220 Paraplegia, unspecified: Secondary | ICD-10-CM | POA: Diagnosis not present

## 2015-09-02 DIAGNOSIS — Z87440 Personal history of urinary (tract) infections: Secondary | ICD-10-CM | POA: Diagnosis not present

## 2015-09-06 DIAGNOSIS — I10 Essential (primary) hypertension: Secondary | ICD-10-CM | POA: Diagnosis not present

## 2015-09-06 DIAGNOSIS — L89312 Pressure ulcer of right buttock, stage 2: Secondary | ICD-10-CM | POA: Diagnosis not present

## 2015-09-06 DIAGNOSIS — Z4800 Encounter for change or removal of nonsurgical wound dressing: Secondary | ICD-10-CM | POA: Diagnosis not present

## 2015-09-06 DIAGNOSIS — G8220 Paraplegia, unspecified: Secondary | ICD-10-CM | POA: Diagnosis not present

## 2015-09-06 DIAGNOSIS — N390 Urinary tract infection, site not specified: Secondary | ICD-10-CM | POA: Diagnosis not present

## 2015-09-06 DIAGNOSIS — E785 Hyperlipidemia, unspecified: Secondary | ICD-10-CM | POA: Diagnosis not present

## 2015-09-06 DIAGNOSIS — Z87440 Personal history of urinary (tract) infections: Secondary | ICD-10-CM | POA: Diagnosis not present

## 2015-09-06 DIAGNOSIS — L89323 Pressure ulcer of left buttock, stage 3: Secondary | ICD-10-CM | POA: Diagnosis not present

## 2015-09-06 DIAGNOSIS — D649 Anemia, unspecified: Secondary | ICD-10-CM | POA: Diagnosis not present

## 2015-09-07 DIAGNOSIS — D649 Anemia, unspecified: Secondary | ICD-10-CM | POA: Diagnosis not present

## 2015-09-07 DIAGNOSIS — L89312 Pressure ulcer of right buttock, stage 2: Secondary | ICD-10-CM | POA: Diagnosis not present

## 2015-09-07 DIAGNOSIS — N390 Urinary tract infection, site not specified: Secondary | ICD-10-CM | POA: Diagnosis not present

## 2015-09-07 DIAGNOSIS — Z4800 Encounter for change or removal of nonsurgical wound dressing: Secondary | ICD-10-CM | POA: Diagnosis not present

## 2015-09-07 DIAGNOSIS — Z87440 Personal history of urinary (tract) infections: Secondary | ICD-10-CM | POA: Diagnosis not present

## 2015-09-07 DIAGNOSIS — E785 Hyperlipidemia, unspecified: Secondary | ICD-10-CM | POA: Diagnosis not present

## 2015-09-07 DIAGNOSIS — L89323 Pressure ulcer of left buttock, stage 3: Secondary | ICD-10-CM | POA: Diagnosis not present

## 2015-09-07 DIAGNOSIS — I10 Essential (primary) hypertension: Secondary | ICD-10-CM | POA: Diagnosis not present

## 2015-09-07 DIAGNOSIS — G8220 Paraplegia, unspecified: Secondary | ICD-10-CM | POA: Diagnosis not present

## 2015-09-09 ENCOUNTER — Encounter: Payer: Commercial Managed Care - HMO | Attending: Internal Medicine | Admitting: Internal Medicine

## 2015-09-09 DIAGNOSIS — G8220 Paraplegia, unspecified: Secondary | ICD-10-CM | POA: Diagnosis not present

## 2015-09-09 DIAGNOSIS — L89323 Pressure ulcer of left buttock, stage 3: Secondary | ICD-10-CM | POA: Diagnosis not present

## 2015-09-09 DIAGNOSIS — L89153 Pressure ulcer of sacral region, stage 3: Secondary | ICD-10-CM | POA: Diagnosis not present
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
## 2015-09-10 NOTE — Progress Notes (Addendum)
NIJEL, AMBURGEY ([id number]) Visit Report for 09/09/2015 Arrival Information Details Patient Name: JENIFER, MCCANDLISH. Date of Service: 09/09/2015 12:45 PM Medical Record Number: [id number] Patient Account Number: [id number] Date of Birth/Sex: 01/24/1946 (70 y.o. Male) Treating RN: Montey Hora Primary Care Physician: [name] Other Clinician: Referring Physician: [name] Treating Physician/Extender: Tito Dine in Treatment: 34 Visit Information History Since Last Visit Added or deleted any medications: No Patient Arrived: Wheel Chair Any new allergies or adverse reactions: No Arrival Time: 12:52 Had a fall or experienced change in No activities of daily living that may affect Accompanied By: self risk of falls: Transfer Assistance: Hoyer Lift Signs or symptoms of abuse/neglect since last No Patient Identification Verified: Yes visito Secondary Verification Process Yes Hospitalized since last visit: No Completed: Pain Present Now: No Patient Requires Transmission-Based No Precautions: Patient Has Alerts: No Electronic Signature(s) Signed: 09/09/2015 5:02:56 PM By: Montey Hora Entered By: Montey Hora on 09/09/2015 13:01:14 [name] -------------------------------------------------------------------------------- Encounter Discharge Information Details Patient Name: [name] Date of Service: 09/09/2015 12:45 PM Medical Record Number: [id number] Patient Account Number: [id number] Date of Birth/Sex: Mar 26, 1946 (70 y.o. Male) Treating RN: Montey Hora Primary Care Physician: [name] Other Clinician: Referring Physician: [name] Treating Physician/Extender: Tito Dine in Treatment: 39 Encounter Discharge Information Items Discharge Pain Level: 0 Discharge Condition: Stable Ambulatory Status: Wheelchair Discharge Destination: Home Transportation: Private Auto Accompanied By:  self Schedule Follow-up Appointment: Yes Medication Reconciliation completed No and provided to Patient/Care Orlyn Odonoghue: Provided on Clinical Summary of Care: 09/09/2015 Form Type Recipient Paper Patient JG Electronic Signature(s) Signed: 09/09/2015 3:58:32 PM By: Montey Hora Previous Signature: 09/09/2015 1:39:27 PM Version By: Ruthine Dose Entered By: Montey Hora on 09/09/2015 15:58:32 [name] -------------------------------------------------------------------------------- Multi Wound Chart Details Patient Name: [name] Date of Service: 09/09/2015 12:45 PM Medical Record Number: [id number] Patient Account Number: [id number] Date of Birth/Sex: [birth year] (70 y.o. Male) Treating RN: Montey Hora Primary Care Physician: [name] Other Clinician: Referring Physician: [name] Treating Physician/Extender: Ricard Dillon Weeks in Treatment: 38 Vital Signs Height(in): 73 Pulse(bpm): 97 Weight(lbs): 205 Blood Pressure 105/52 (mmHg): Body Mass Index(BMI): 27 Temperature(F): 98.0 Respiratory Rate 18 (breaths/min): Photos: [3:No Photos] [N/A:N/A] Wound Location: [3:Sacrum - Medial] [N/A:N/A] Wounding Event: [3:Gradually Appeared] [N/A:N/A] Primary Etiology: [3:Pressure Ulcer] [N/A:N/A] Comorbid History: [3:Hypertension, Rheumatoid Arthritis, Paraplegia] [N/A:N/A] Date Acquired: [3:07/07/2014] [N/A:N/A] Weeks of Treatment: [3:38] [N/A:N/A] Wound Status: [3:Open] [N/A:N/A] Measurements L x W x D 9x6.5x0.3 [N/A:N/A] (cm) Area (cm) : [3:45.946] [N/A:N/A] Volume (cm) : [3:13.784] [N/A:N/A] % Reduction in Area: [3:30.30%] [N/A:N/A] % Reduction in Volume: 30.30% [N/A:N/A] Classification: [3:Category/Stage III] [N/A:N/A] Exudate Amount: [3:Medium] [N/A:N/A] Exudate Type: [3:Serosanguineous] [N/A:N/A] Exudate Color: [3:red, brown] [N/A:N/A] Wound Margin: [3:Distinct, outline attached] [N/A:N/A] Granulation Amount: [3:Large  (67-100%)] [N/A:N/A] Necrotic Amount: [3:Small (1-33%)] [N/A:N/A] Exposed Structures: [3:Fascia: No Fat: No Tendon: No Muscle: No Joint: No Bone: No] [N/A:N/A] Limited to Skin Breakdown Epithelialization: Small (1-33%) N/A N/A Periwound Skin Texture: Scarring: Yes N/A N/A Edema: No Excoriation: No Induration: No Callus: No Crepitus: No Fluctuance: No Friable: No Rash: No Periwound Skin Maceration: Yes N/A N/A Moisture: Moist: Yes Dry/Scaly: No Periwound Skin Color: Atrophie Blanche: No N/A N/A Cyanosis: No Ecchymosis: No Erythema: No Hemosiderin Staining: No Mottled: No Pallor: No Rubor: No Temperature: No Abnormality N/A N/A Tenderness on No N/A N/A Palpation: Wound Preparation: Ulcer Cleansing: N/A N/A Rinsed/Irrigated with Saline Topical Anesthetic Applied: Other: lidocaine 4% Treatment Notes Electronic Signature(s) Signed: 09/09/2015 5:02:56 PM By:  Dorthy, Di Kindle Entered By: Montey Hora on 09/09/2015 13:21:47 [name] -------------------------------------------------------------------------------- Mark Plan Details Patient Name: HONEST, STARE. Date of Service: 09/09/2015 12:45 PM Medical Record Number: [id number] Patient Account Number: [id number] Date of Birth/Sex: Oct 27, 1945 (70 y.o. Male) Treating RN: Montey Hora Primary Care Physician: [name] Other Clinician: Referring Physician: [name] Treating Physician/Extender: Tito Dine in Treatment: 61 Active Inactive Electronic Signature(s) Signed: 10/30/2015 10:11:32 AM By: Gretta Cool RN, BSN, Kim RN, BSN Signed: 02/02/2016 4:06:55 PM By: Montey Hora Previous Signature: 09/09/2015 5:02:56 PM Version By: Montey Hora Entered By: Gretta Cool RN, BSN, Kim on 10/07/2015 17:30:44 [name] -------------------------------------------------------------------------------- Patient/Caregiver Education Details Patient Name:  NATRELL, KOSS A. Date of Service: 09/09/2015 12:45 PM Medical Record Number: [id number] Patient Account Number: [id number] Date of Birth/Gender: [birth year] (70 y.o. Male) Treating RN: Montey Hora Primary Care Physician: [name] Other Clinician: Referring Physician: [name] Treating Physician/Extender: Tito Dine in Treatment: 61 Education Assessment Education Provided To: Patient Education Topics Provided Wound/Skin Impairment: Handouts: Other: f/u after released from [facility] [employer] Methods: Explain/Verbal Responses: State content correctly Electronic Signature(s) Signed: 09/09/2015 3:58:56 PM By: Montey Hora Entered By: Montey Hora on 09/09/2015 15:58:56 [name] -------------------------------------------------------------------------------- Wound Assessment Details Patient Name: [name] Date of Service: 09/09/2015 12:45 PM Medical Record Number: [id number] Patient Account Number: [id number] Date of Birth/Sex: Oct 27, 1945 (70 y.o. Male) Treating RN: Montey Hora Primary Care Physician: [name] Other Clinician: Referring Physician: [name] Treating Physician/Extender: Ricard Dillon Weeks in Treatment: 38 Wound Status Wound Number: 3 Primary Pressure Ulcer Etiology: Wound Location: Sacrum - Medial Wound Status: Open Wounding Event: Gradually Appeared Comorbid Hypertension, Rheumatoid Arthritis, Date Acquired: 07/07/2014 History: Paraplegia Weeks Of Treatment: 38 Clustered Wound: No Photos Photo Uploaded By: Montey Hora on 09/09/2015 16:06:50 Wound Measurements Length: (cm) 9 Width: (cm) 6.5 Depth: (cm) 0.3 Area: (cm) 45.946 Volume: (cm) 13.784 % Reduction in Area: 30.3% % Reduction in Volume: 30.3% Epithelialization: Small (1-33%) Tunneling: No Undermining: No Wound Description Classification: Category/Stage III Foul Odor Af Wound Margin: Distinct, outline  attached Exudate Amount: Medium Exudate Type: Serosanguineous Exudate Color: red, brown ter Cleansing: No Wound Bed Granulation Amount: Large (67-100%) Exposed Structure Necrotic Amount: Small (1-33%) Fascia Exposed: No Necrotic Quality: Adherent Slough Fat Layer Exposed: No Tendon Exposed: No ANDRETTI, STONES A. ([id number]) Muscle Exposed: No Joint Exposed: No Bone Exposed: No Limited to Skin Breakdown Periwound Skin Texture Texture Color No Abnormalities Noted: No No Abnormalities Noted: No Callus: No Atrophie Blanche: No Crepitus: No Cyanosis: No Excoriation: No Ecchymosis: No Fluctuance: No Erythema: No Friable: No Hemosiderin Staining: No Induration: No Mottled: No Localized Edema: No Pallor: No Rash: No Rubor: No Scarring: Yes Temperature / Pain Moisture Temperature: No Abnormality No Abnormalities Noted: No Dry / Scaly: No Maceration: Yes Moist: Yes Wound Preparation Ulcer Cleansing: Rinsed/Irrigated with Saline Topical Anesthetic Applied: Other: lidocaine 4%, Electronic Signature(s) Signed: 09/09/2015 5:02:56 PM By: Montey Hora Entered By: Montey Hora on 09/09/2015 13:21:18 [name] -------------------------------------------------------------------------------- Vitals Details Patient Name: [name] Date of Service: 09/09/2015 12:45 PM Medical Record Number: [id number] Patient Account Number: [id number] Date of Birth/Sex: 01/15/1946 (70 y.o. Male) Treating RN: Montey Hora Primary Care Physician: [name] Other Clinician: Referring Physician: [name] Treating Physician/Extender: Tito Dine in Treatment: 22 Vital Signs Time Taken: 13:01 Temperature (F): 98.0 Height (in): 73 Pulse (bpm): 97 Weight (lbs): 205 Respiratory Rate (breaths/min): 18 Body Mass Index (BMI): 27 Blood Pressure (mmHg):  105/52 Reference Range: 80 - 120 mg / dl Electronic Signature(s) Signed: 09/09/2015  5:02:56 PM By: Montey Hora Entered By: Montey Hora on 09/09/2015 13:01:48

## 2015-09-11 DIAGNOSIS — G8220 Paraplegia, unspecified: Secondary | ICD-10-CM | POA: Diagnosis not present

## 2015-09-11 DIAGNOSIS — I10 Essential (primary) hypertension: Secondary | ICD-10-CM | POA: Diagnosis not present

## 2015-09-11 DIAGNOSIS — L89312 Pressure ulcer of right buttock, stage 2: Secondary | ICD-10-CM | POA: Diagnosis not present

## 2015-09-11 DIAGNOSIS — Z4800 Encounter for change or removal of nonsurgical wound dressing: Secondary | ICD-10-CM | POA: Diagnosis not present

## 2015-09-11 DIAGNOSIS — E785 Hyperlipidemia, unspecified: Secondary | ICD-10-CM | POA: Diagnosis not present

## 2015-09-11 DIAGNOSIS — N390 Urinary tract infection, site not specified: Secondary | ICD-10-CM | POA: Diagnosis not present

## 2015-09-11 DIAGNOSIS — Z87440 Personal history of urinary (tract) infections: Secondary | ICD-10-CM | POA: Diagnosis not present

## 2015-09-11 DIAGNOSIS — D649 Anemia, unspecified: Secondary | ICD-10-CM | POA: Diagnosis not present

## 2015-09-11 DIAGNOSIS — L89323 Pressure ulcer of left buttock, stage 3: Secondary | ICD-10-CM | POA: Diagnosis not present

## 2015-09-15 DIAGNOSIS — G8220 Paraplegia, unspecified: Secondary | ICD-10-CM | POA: Diagnosis not present

## 2015-09-15 DIAGNOSIS — D649 Anemia, unspecified: Secondary | ICD-10-CM | POA: Diagnosis not present

## 2015-09-15 DIAGNOSIS — Z4800 Encounter for change or removal of nonsurgical wound dressing: Secondary | ICD-10-CM | POA: Diagnosis not present

## 2015-09-15 DIAGNOSIS — Z87440 Personal history of urinary (tract) infections: Secondary | ICD-10-CM | POA: Diagnosis not present

## 2015-09-15 DIAGNOSIS — I10 Essential (primary) hypertension: Secondary | ICD-10-CM | POA: Diagnosis not present

## 2015-09-15 DIAGNOSIS — L89323 Pressure ulcer of left buttock, stage 3: Secondary | ICD-10-CM | POA: Diagnosis not present

## 2015-09-15 DIAGNOSIS — N390 Urinary tract infection, site not specified: Secondary | ICD-10-CM | POA: Diagnosis not present

## 2015-09-15 DIAGNOSIS — L89312 Pressure ulcer of right buttock, stage 2: Secondary | ICD-10-CM | POA: Diagnosis not present

## 2015-09-15 DIAGNOSIS — E785 Hyperlipidemia, unspecified: Secondary | ICD-10-CM | POA: Diagnosis not present

## 2015-09-24 DIAGNOSIS — G8220 Paraplegia, unspecified: Secondary | ICD-10-CM | POA: Diagnosis not present

## 2015-09-24 DIAGNOSIS — L8992 Pressure ulcer of unspecified site, stage 2: Secondary | ICD-10-CM | POA: Diagnosis not present

## 2015-09-24 DIAGNOSIS — S24153S Other incomplete lesion at T7-T10 level of thoracic spinal cord, sequela: Secondary | ICD-10-CM | POA: Diagnosis not present

## 2015-09-24 DIAGNOSIS — Z87891 Personal history of nicotine dependence: Secondary | ICD-10-CM | POA: Diagnosis not present

## 2015-09-24 DIAGNOSIS — I10 Essential (primary) hypertension: Secondary | ICD-10-CM | POA: Diagnosis not present

## 2015-09-24 DIAGNOSIS — L89152 Pressure ulcer of sacral region, stage 2: Secondary | ICD-10-CM | POA: Diagnosis not present

## 2015-09-25 DIAGNOSIS — Z87440 Personal history of urinary (tract) infections: Secondary | ICD-10-CM | POA: Diagnosis not present

## 2015-09-25 DIAGNOSIS — L89323 Pressure ulcer of left buttock, stage 3: Secondary | ICD-10-CM | POA: Diagnosis not present

## 2015-09-25 DIAGNOSIS — E785 Hyperlipidemia, unspecified: Secondary | ICD-10-CM | POA: Diagnosis not present

## 2015-09-25 DIAGNOSIS — D649 Anemia, unspecified: Secondary | ICD-10-CM | POA: Diagnosis not present

## 2015-09-25 DIAGNOSIS — I10 Essential (primary) hypertension: Secondary | ICD-10-CM | POA: Diagnosis not present

## 2015-09-25 DIAGNOSIS — N390 Urinary tract infection, site not specified: Secondary | ICD-10-CM | POA: Diagnosis not present

## 2015-09-25 DIAGNOSIS — G8220 Paraplegia, unspecified: Secondary | ICD-10-CM | POA: Diagnosis not present

## 2015-09-25 DIAGNOSIS — Z4800 Encounter for change or removal of nonsurgical wound dressing: Secondary | ICD-10-CM | POA: Diagnosis not present

## 2015-09-25 DIAGNOSIS — L89312 Pressure ulcer of right buttock, stage 2: Secondary | ICD-10-CM | POA: Diagnosis not present

## 2015-10-02 DIAGNOSIS — E785 Hyperlipidemia, unspecified: Secondary | ICD-10-CM | POA: Diagnosis not present

## 2015-10-02 DIAGNOSIS — D649 Anemia, unspecified: Secondary | ICD-10-CM | POA: Diagnosis not present

## 2015-10-02 DIAGNOSIS — L89323 Pressure ulcer of left buttock, stage 3: Secondary | ICD-10-CM | POA: Diagnosis not present

## 2015-10-02 DIAGNOSIS — N390 Urinary tract infection, site not specified: Secondary | ICD-10-CM | POA: Diagnosis not present

## 2015-10-02 DIAGNOSIS — I10 Essential (primary) hypertension: Secondary | ICD-10-CM | POA: Diagnosis not present

## 2015-10-02 DIAGNOSIS — L89312 Pressure ulcer of right buttock, stage 2: Secondary | ICD-10-CM | POA: Diagnosis not present

## 2015-10-02 DIAGNOSIS — G8220 Paraplegia, unspecified: Secondary | ICD-10-CM | POA: Diagnosis not present

## 2015-10-02 DIAGNOSIS — Z87440 Personal history of urinary (tract) infections: Secondary | ICD-10-CM | POA: Diagnosis not present

## 2015-10-02 DIAGNOSIS — Z4800 Encounter for change or removal of nonsurgical wound dressing: Secondary | ICD-10-CM | POA: Diagnosis not present

## 2015-10-12 DIAGNOSIS — L89312 Pressure ulcer of right buttock, stage 2: Secondary | ICD-10-CM | POA: Diagnosis not present

## 2015-10-12 DIAGNOSIS — E785 Hyperlipidemia, unspecified: Secondary | ICD-10-CM | POA: Diagnosis not present

## 2015-10-12 DIAGNOSIS — N390 Urinary tract infection, site not specified: Secondary | ICD-10-CM | POA: Diagnosis not present

## 2015-10-12 DIAGNOSIS — Z87440 Personal history of urinary (tract) infections: Secondary | ICD-10-CM | POA: Diagnosis not present

## 2015-10-12 DIAGNOSIS — L89323 Pressure ulcer of left buttock, stage 3: Secondary | ICD-10-CM | POA: Diagnosis not present

## 2015-10-12 DIAGNOSIS — G8220 Paraplegia, unspecified: Secondary | ICD-10-CM | POA: Diagnosis not present

## 2015-10-12 DIAGNOSIS — I10 Essential (primary) hypertension: Secondary | ICD-10-CM | POA: Diagnosis not present

## 2015-10-12 DIAGNOSIS — D649 Anemia, unspecified: Secondary | ICD-10-CM | POA: Diagnosis not present

## 2015-10-12 DIAGNOSIS — Z4800 Encounter for change or removal of nonsurgical wound dressing: Secondary | ICD-10-CM | POA: Diagnosis not present

## 2015-10-19 DIAGNOSIS — I10 Essential (primary) hypertension: Secondary | ICD-10-CM | POA: Diagnosis not present

## 2015-10-19 DIAGNOSIS — L89312 Pressure ulcer of right buttock, stage 2: Secondary | ICD-10-CM | POA: Diagnosis not present

## 2015-10-19 DIAGNOSIS — G8220 Paraplegia, unspecified: Secondary | ICD-10-CM | POA: Diagnosis not present

## 2015-10-19 DIAGNOSIS — Z87440 Personal history of urinary (tract) infections: Secondary | ICD-10-CM | POA: Diagnosis not present

## 2015-10-19 DIAGNOSIS — N390 Urinary tract infection, site not specified: Secondary | ICD-10-CM | POA: Diagnosis not present

## 2015-10-19 DIAGNOSIS — L89323 Pressure ulcer of left buttock, stage 3: Secondary | ICD-10-CM | POA: Diagnosis not present

## 2015-10-19 DIAGNOSIS — E785 Hyperlipidemia, unspecified: Secondary | ICD-10-CM | POA: Diagnosis not present

## 2015-10-19 DIAGNOSIS — D649 Anemia, unspecified: Secondary | ICD-10-CM | POA: Diagnosis not present

## 2015-10-19 DIAGNOSIS — Z4800 Encounter for change or removal of nonsurgical wound dressing: Secondary | ICD-10-CM | POA: Diagnosis not present

## 2015-10-23 DIAGNOSIS — L89323 Pressure ulcer of left buttock, stage 3: Secondary | ICD-10-CM | POA: Diagnosis not present

## 2015-10-23 DIAGNOSIS — Z87440 Personal history of urinary (tract) infections: Secondary | ICD-10-CM | POA: Diagnosis not present

## 2015-10-23 DIAGNOSIS — N390 Urinary tract infection, site not specified: Secondary | ICD-10-CM | POA: Diagnosis not present

## 2015-10-23 DIAGNOSIS — Z4800 Encounter for change or removal of nonsurgical wound dressing: Secondary | ICD-10-CM | POA: Diagnosis not present

## 2015-10-23 DIAGNOSIS — G8220 Paraplegia, unspecified: Secondary | ICD-10-CM | POA: Diagnosis not present

## 2015-10-23 DIAGNOSIS — L89312 Pressure ulcer of right buttock, stage 2: Secondary | ICD-10-CM | POA: Diagnosis not present

## 2015-10-23 DIAGNOSIS — I10 Essential (primary) hypertension: Secondary | ICD-10-CM | POA: Diagnosis not present

## 2015-10-23 DIAGNOSIS — D649 Anemia, unspecified: Secondary | ICD-10-CM | POA: Diagnosis not present

## 2015-10-23 DIAGNOSIS — E785 Hyperlipidemia, unspecified: Secondary | ICD-10-CM | POA: Diagnosis not present

## 2015-10-26 DIAGNOSIS — I10 Essential (primary) hypertension: Secondary | ICD-10-CM | POA: Diagnosis not present

## 2015-10-26 DIAGNOSIS — Z4800 Encounter for change or removal of nonsurgical wound dressing: Secondary | ICD-10-CM | POA: Diagnosis not present

## 2015-10-26 DIAGNOSIS — E785 Hyperlipidemia, unspecified: Secondary | ICD-10-CM | POA: Diagnosis not present

## 2015-10-26 DIAGNOSIS — Z87440 Personal history of urinary (tract) infections: Secondary | ICD-10-CM | POA: Diagnosis not present

## 2015-10-26 DIAGNOSIS — G8220 Paraplegia, unspecified: Secondary | ICD-10-CM | POA: Diagnosis not present

## 2015-10-26 DIAGNOSIS — L89323 Pressure ulcer of left buttock, stage 3: Secondary | ICD-10-CM | POA: Diagnosis not present

## 2015-10-26 DIAGNOSIS — L89312 Pressure ulcer of right buttock, stage 2: Secondary | ICD-10-CM | POA: Diagnosis not present

## 2015-10-26 DIAGNOSIS — D649 Anemia, unspecified: Secondary | ICD-10-CM | POA: Diagnosis not present

## 2015-10-26 DIAGNOSIS — N390 Urinary tract infection, site not specified: Secondary | ICD-10-CM | POA: Diagnosis not present

## 2015-11-04 DIAGNOSIS — Z4800 Encounter for change or removal of nonsurgical wound dressing: Secondary | ICD-10-CM | POA: Diagnosis not present

## 2015-11-04 DIAGNOSIS — L89323 Pressure ulcer of left buttock, stage 3: Secondary | ICD-10-CM | POA: Diagnosis not present

## 2015-11-04 DIAGNOSIS — L89312 Pressure ulcer of right buttock, stage 2: Secondary | ICD-10-CM | POA: Diagnosis not present

## 2015-11-04 DIAGNOSIS — N390 Urinary tract infection, site not specified: Secondary | ICD-10-CM | POA: Diagnosis not present

## 2015-11-04 DIAGNOSIS — D649 Anemia, unspecified: Secondary | ICD-10-CM | POA: Diagnosis not present

## 2015-11-04 DIAGNOSIS — E785 Hyperlipidemia, unspecified: Secondary | ICD-10-CM | POA: Diagnosis not present

## 2015-11-04 DIAGNOSIS — G8220 Paraplegia, unspecified: Secondary | ICD-10-CM | POA: Diagnosis not present

## 2015-11-04 DIAGNOSIS — I10 Essential (primary) hypertension: Secondary | ICD-10-CM | POA: Diagnosis not present

## 2015-11-04 DIAGNOSIS — Z87440 Personal history of urinary (tract) infections: Secondary | ICD-10-CM | POA: Diagnosis not present

## 2015-11-06 DIAGNOSIS — I10 Essential (primary) hypertension: Secondary | ICD-10-CM | POA: Diagnosis not present

## 2015-11-06 DIAGNOSIS — Z4800 Encounter for change or removal of nonsurgical wound dressing: Secondary | ICD-10-CM | POA: Diagnosis not present

## 2015-11-06 DIAGNOSIS — G8220 Paraplegia, unspecified: Secondary | ICD-10-CM | POA: Diagnosis not present

## 2015-11-06 DIAGNOSIS — Z87440 Personal history of urinary (tract) infections: Secondary | ICD-10-CM | POA: Diagnosis not present

## 2015-11-06 DIAGNOSIS — E785 Hyperlipidemia, unspecified: Secondary | ICD-10-CM | POA: Diagnosis not present

## 2015-11-06 DIAGNOSIS — D649 Anemia, unspecified: Secondary | ICD-10-CM | POA: Diagnosis not present

## 2015-11-06 DIAGNOSIS — L89323 Pressure ulcer of left buttock, stage 3: Secondary | ICD-10-CM | POA: Diagnosis not present

## 2015-11-09 DIAGNOSIS — Z0000 Encounter for general adult medical examination without abnormal findings: Secondary | ICD-10-CM | POA: Diagnosis not present

## 2015-11-09 DIAGNOSIS — R600 Localized edema: Secondary | ICD-10-CM | POA: Diagnosis not present

## 2015-11-09 DIAGNOSIS — Z23 Encounter for immunization: Secondary | ICD-10-CM | POA: Diagnosis not present

## 2015-11-09 DIAGNOSIS — L600 Ingrowing nail: Secondary | ICD-10-CM | POA: Diagnosis not present

## 2015-11-10 DIAGNOSIS — D649 Anemia, unspecified: Secondary | ICD-10-CM | POA: Diagnosis not present

## 2015-11-10 DIAGNOSIS — E785 Hyperlipidemia, unspecified: Secondary | ICD-10-CM | POA: Diagnosis not present

## 2015-11-10 DIAGNOSIS — G8220 Paraplegia, unspecified: Secondary | ICD-10-CM | POA: Diagnosis not present

## 2015-11-10 DIAGNOSIS — Z4800 Encounter for change or removal of nonsurgical wound dressing: Secondary | ICD-10-CM | POA: Diagnosis not present

## 2015-11-10 DIAGNOSIS — L89323 Pressure ulcer of left buttock, stage 3: Secondary | ICD-10-CM | POA: Diagnosis not present

## 2015-11-10 DIAGNOSIS — Z87440 Personal history of urinary (tract) infections: Secondary | ICD-10-CM | POA: Diagnosis not present

## 2015-11-10 DIAGNOSIS — I10 Essential (primary) hypertension: Secondary | ICD-10-CM | POA: Diagnosis not present

## 2015-11-11 DIAGNOSIS — G8220 Paraplegia, unspecified: Secondary | ICD-10-CM | POA: Diagnosis not present

## 2015-11-11 DIAGNOSIS — Z4800 Encounter for change or removal of nonsurgical wound dressing: Secondary | ICD-10-CM | POA: Diagnosis not present

## 2015-11-11 DIAGNOSIS — Z87440 Personal history of urinary (tract) infections: Secondary | ICD-10-CM | POA: Diagnosis not present

## 2015-11-11 DIAGNOSIS — E785 Hyperlipidemia, unspecified: Secondary | ICD-10-CM | POA: Diagnosis not present

## 2015-11-11 DIAGNOSIS — D649 Anemia, unspecified: Secondary | ICD-10-CM | POA: Diagnosis not present

## 2015-11-11 DIAGNOSIS — L89323 Pressure ulcer of left buttock, stage 3: Secondary | ICD-10-CM | POA: Diagnosis not present

## 2015-11-11 DIAGNOSIS — I10 Essential (primary) hypertension: Secondary | ICD-10-CM | POA: Diagnosis not present

## 2015-11-12 DIAGNOSIS — I10 Essential (primary) hypertension: Secondary | ICD-10-CM | POA: Diagnosis not present

## 2015-11-12 DIAGNOSIS — Z0000 Encounter for general adult medical examination without abnormal findings: Secondary | ICD-10-CM | POA: Diagnosis not present

## 2015-11-17 DIAGNOSIS — Z862 Personal history of diseases of the blood and blood-forming organs and certain disorders involving the immune mechanism: Secondary | ICD-10-CM | POA: Diagnosis not present

## 2015-11-17 DIAGNOSIS — G8220 Paraplegia, unspecified: Secondary | ICD-10-CM | POA: Diagnosis not present

## 2015-11-17 DIAGNOSIS — I10 Essential (primary) hypertension: Secondary | ICD-10-CM | POA: Diagnosis not present

## 2015-11-17 DIAGNOSIS — Z87440 Personal history of urinary (tract) infections: Secondary | ICD-10-CM | POA: Diagnosis not present

## 2015-11-17 DIAGNOSIS — D649 Anemia, unspecified: Secondary | ICD-10-CM | POA: Diagnosis not present

## 2015-11-17 DIAGNOSIS — Z4800 Encounter for change or removal of nonsurgical wound dressing: Secondary | ICD-10-CM | POA: Diagnosis not present

## 2015-11-17 DIAGNOSIS — L89323 Pressure ulcer of left buttock, stage 3: Secondary | ICD-10-CM | POA: Diagnosis not present

## 2015-11-17 DIAGNOSIS — E785 Hyperlipidemia, unspecified: Secondary | ICD-10-CM | POA: Diagnosis not present

## 2015-11-23 DIAGNOSIS — L600 Ingrowing nail: Secondary | ICD-10-CM | POA: Diagnosis not present

## 2015-11-23 DIAGNOSIS — B351 Tinea unguium: Secondary | ICD-10-CM | POA: Diagnosis not present

## 2015-11-23 DIAGNOSIS — M79675 Pain in left toe(s): Secondary | ICD-10-CM | POA: Diagnosis not present

## 2015-11-30 DIAGNOSIS — Z4800 Encounter for change or removal of nonsurgical wound dressing: Secondary | ICD-10-CM | POA: Diagnosis not present

## 2015-11-30 DIAGNOSIS — L89323 Pressure ulcer of left buttock, stage 3: Secondary | ICD-10-CM | POA: Diagnosis not present

## 2015-11-30 DIAGNOSIS — Z87440 Personal history of urinary (tract) infections: Secondary | ICD-10-CM | POA: Diagnosis not present

## 2015-11-30 DIAGNOSIS — G8220 Paraplegia, unspecified: Secondary | ICD-10-CM | POA: Diagnosis not present

## 2015-11-30 DIAGNOSIS — I10 Essential (primary) hypertension: Secondary | ICD-10-CM | POA: Diagnosis not present

## 2015-11-30 DIAGNOSIS — D649 Anemia, unspecified: Secondary | ICD-10-CM | POA: Diagnosis not present

## 2015-11-30 DIAGNOSIS — E785 Hyperlipidemia, unspecified: Secondary | ICD-10-CM | POA: Diagnosis not present

## 2015-12-15 DIAGNOSIS — L89153 Pressure ulcer of sacral region, stage 3: Secondary | ICD-10-CM | POA: Diagnosis not present

## 2015-12-18 DIAGNOSIS — I10 Essential (primary) hypertension: Secondary | ICD-10-CM | POA: Diagnosis not present

## 2015-12-18 DIAGNOSIS — L89323 Pressure ulcer of left buttock, stage 3: Secondary | ICD-10-CM | POA: Diagnosis not present

## 2015-12-18 DIAGNOSIS — E785 Hyperlipidemia, unspecified: Secondary | ICD-10-CM | POA: Diagnosis not present

## 2015-12-18 DIAGNOSIS — Z87440 Personal history of urinary (tract) infections: Secondary | ICD-10-CM | POA: Diagnosis not present

## 2015-12-18 DIAGNOSIS — Z4800 Encounter for change or removal of nonsurgical wound dressing: Secondary | ICD-10-CM | POA: Diagnosis not present

## 2015-12-18 DIAGNOSIS — D649 Anemia, unspecified: Secondary | ICD-10-CM | POA: Diagnosis not present

## 2015-12-18 DIAGNOSIS — G8220 Paraplegia, unspecified: Secondary | ICD-10-CM | POA: Diagnosis not present

## 2015-12-25 DIAGNOSIS — Z4800 Encounter for change or removal of nonsurgical wound dressing: Secondary | ICD-10-CM | POA: Diagnosis not present

## 2015-12-25 DIAGNOSIS — D649 Anemia, unspecified: Secondary | ICD-10-CM | POA: Diagnosis not present

## 2015-12-25 DIAGNOSIS — Z87440 Personal history of urinary (tract) infections: Secondary | ICD-10-CM | POA: Diagnosis not present

## 2015-12-25 DIAGNOSIS — G8220 Paraplegia, unspecified: Secondary | ICD-10-CM | POA: Diagnosis not present

## 2015-12-25 DIAGNOSIS — E785 Hyperlipidemia, unspecified: Secondary | ICD-10-CM | POA: Diagnosis not present

## 2015-12-25 DIAGNOSIS — L89323 Pressure ulcer of left buttock, stage 3: Secondary | ICD-10-CM | POA: Diagnosis not present

## 2015-12-25 DIAGNOSIS — I10 Essential (primary) hypertension: Secondary | ICD-10-CM | POA: Diagnosis not present

## 2015-12-31 DIAGNOSIS — D649 Anemia, unspecified: Secondary | ICD-10-CM | POA: Diagnosis not present

## 2015-12-31 DIAGNOSIS — Z87440 Personal history of urinary (tract) infections: Secondary | ICD-10-CM | POA: Diagnosis not present

## 2015-12-31 DIAGNOSIS — I10 Essential (primary) hypertension: Secondary | ICD-10-CM | POA: Diagnosis not present

## 2015-12-31 DIAGNOSIS — L89323 Pressure ulcer of left buttock, stage 3: Secondary | ICD-10-CM | POA: Diagnosis not present

## 2015-12-31 DIAGNOSIS — G8220 Paraplegia, unspecified: Secondary | ICD-10-CM | POA: Diagnosis not present

## 2015-12-31 DIAGNOSIS — E785 Hyperlipidemia, unspecified: Secondary | ICD-10-CM | POA: Diagnosis not present

## 2015-12-31 DIAGNOSIS — Z4800 Encounter for change or removal of nonsurgical wound dressing: Secondary | ICD-10-CM | POA: Diagnosis not present

## 2016-01-29 DIAGNOSIS — L89153 Pressure ulcer of sacral region, stage 3: Secondary | ICD-10-CM | POA: Diagnosis not present

## 2016-01-29 DIAGNOSIS — S31809A Unspecified open wound of unspecified buttock, initial encounter: Secondary | ICD-10-CM | POA: Diagnosis not present

## 2016-02-18 DIAGNOSIS — G8220 Paraplegia, unspecified: Secondary | ICD-10-CM | POA: Diagnosis not present

## 2016-02-18 DIAGNOSIS — R109 Unspecified abdominal pain: Secondary | ICD-10-CM | POA: Diagnosis not present

## 2016-03-01 ENCOUNTER — Encounter: Payer: Commercial Managed Care - HMO | Attending: Internal Medicine | Admitting: Internal Medicine

## 2016-03-01 DIAGNOSIS — L89153 Pressure ulcer of sacral region, stage 3: Secondary | ICD-10-CM | POA: Diagnosis not present

## 2016-03-01 DIAGNOSIS — I10 Essential (primary) hypertension: Secondary | ICD-10-CM | POA: Insufficient documentation

## 2016-03-01 DIAGNOSIS — Z87891 Personal history of nicotine dependence: Secondary | ICD-10-CM | POA: Insufficient documentation

## 2016-03-01 DIAGNOSIS — M069 Rheumatoid arthritis, unspecified: Secondary | ICD-10-CM | POA: Insufficient documentation

## 2016-03-01 DIAGNOSIS — G8221 Paraplegia, complete: Secondary | ICD-10-CM | POA: Diagnosis not present

## 2016-03-01 NOTE — Progress Notes (Signed)
NAVI, SUGUITAN ([id number]) Visit Report for 03/01/2016 Abuse/Suicide Risk Screen Details Patient Name: Bradley [name], Bradley [name]. Date of Service: 03/01/2016 8:45 AM Medical Record Patient Account Number: [id number] Number: Treating RN: Montey Hora 1946/03/04 (70 y.o. Other Clinician: Date of Birth/Sex: Male) Treating ROBSON, MICHAEL Primary Care Physician: [name] Physician/Extender: G Referring Physician: Abelardo Diesel in Treatment: 0 Abuse/Suicide Risk Screen Items Answer ABUSE/SUICIDE RISK SCREEN: Has anyone close to you tried to hurt or harm you recentlyo No Do you feel uncomfortable with anyone in your familyo No Has anyone forced you do things that you didnot want to doo No Do you have any thoughts of harming yourselfo No Patient displays signs or symptoms of abuse and/or neglect. No Electronic Signature(s) Signed: 03/01/2016 8:47:18 AM By: Montey Hora Entered By: Montey Hora on 03/01/2016 08:47:18 Bradley [name] ([id number]) -------------------------------------------------------------------------------- Activities of Daily Living Details Patient Name: SENNA, [name] Date of Service: 03/01/2016 8:45 AM Medical Record Patient Account Number: [id number] Number: Treating RN: Montey Hora 03-Sep-1945 (70 y.o. Other Clinician: Date of Birth/Sex: Male) Treating ROBSON, MICHAEL Primary Care Physician: [name] Physician/Extender: G Referring Physician: Abelardo Diesel in Treatment: 0 Activities of Daily Living Items Answer Activities of Daily Living (Please select one for each item) Drive Automobile Not Able Take Medications Completely Able Use Telephone Completely Able Care for Appearance Need Assistance Use Toilet Need Assistance Bath / Shower Need Assistance Dress Self Need Assistance Feed Self Completely Able Walk Not Able Get In / Out Bed Need Assistance Housework Need Assistance Prepare Meals  Completely [place] for Self Need Assistance Electronic Signature(s) Signed: 03/01/2016 8:47:48 AM By: Montey Hora Entered By: Montey Hora on 03/01/2016 08:47:48 Bradley [name] ([id number]) -------------------------------------------------------------------------------- Education Assessment Details Patient Name: Bradley Loa A. Date of Service: 03/01/2016 8:45 AM Medical Record Patient Account Number: [id number] Number: Treating RN: Montey Hora [birth year] (70 y.o. Other Clinician: Date of Birth/Sex: Male) Treating ROBSON, MICHAEL Primary Care Physician: [name] Physician/Extender: G Referring Physician: Abelardo Diesel in Treatment: 0 Primary Learner Assessed: Caregiver Reason Patient is not Primary Learner: wound location Learning Preferences/Education Level/Primary Language Learning Preference: Explanation, Demonstration, Printed Material Highest Education Level: College or Above Preferred Language: English Cognitive Barrier Assessment/Beliefs Language Barrier: No Translator Needed: No Memory Deficit: No Emotional Barrier: No Cultural/Religious Beliefs Affecting Medical No Care: Physical Barrier Assessment Impaired Vision: No Impaired Hearing: No Decreased Hand dexterity: No Knowledge/Comprehension Assessment Knowledge Level: Medium Comprehension Level: Medium Ability to understand written Medium instructions: Ability to understand verbal Medium instructions: Motivation Assessment Anxiety Level: Calm Cooperation: Cooperative Education Importance: Acknowledges Need Interest in Health Problems: Asks Questions Perception: Coherent Willingness to Engage in Self- Medium Management Activities: Medium BUNNY, SMEDBERG ([id number]) Readiness to Engage in Self- Management Activities: Electronic Signature(s) Signed: 03/01/2016 8:48:19 AM By: Montey Hora Entered By: Montey Hora on 03/01/2016  08:48:19 Bradley [name] ([id number]) -------------------------------------------------------------------------------- Fall Risk Assessment Details Patient Name: Bradley [name]. Date of Service: 03/01/2016 8:45 AM Medical Record Patient Account Number: [id number] Number: Treating RN: Montey Hora 07/20/1945 (70 y.o. Other Clinician: Date of Birth/Sex: Male) Treating ROBSON, MICHAEL Primary Care Physician: [name] Physician/Extender: G Referring Physician: Abelardo Diesel in Treatment: 0 Fall Risk Assessment Items Have you had 2 or more falls in the last 12 monthso 0 No Have you had any fall that resulted in injury in the last 12 monthso 0 No FALL RISK ASSESSMENT: History of falling - immediate or within 3 months 0 No  Secondary diagnosis 0 No Ambulatory aid None/bed rest/wheelchair/nurse 0 Yes Crutches/cane/walker 0 No Furniture 0 No IV Access/Saline Lock 0 No Gait/Training Normal/bed rest/immobile 0 Yes Weak 10 Yes Impaired 0 No Mental Status Oriented to own ability 0 Yes Electronic Signature(s) Signed: 03/01/2016 8:48:32 AM By: Montey Hora Entered By: Montey Hora on 03/01/2016 08:48:32 Bradley [name] ([id number]) -------------------------------------------------------------------------------- Nutrition Risk Assessment Details Patient Name: Bradley Loa A. Date of Service: 03/01/2016 8:45 AM Medical Record Patient Account Number: [id number] Number: Treating RN: Montey Hora 1945/07/08 (70 y.o. Other Clinician: Date of Birth/Sex: Male) Treating ROBSON, MICHAEL Primary Care Physician: [name] Physician/Extender: G Referring Physician: Abelardo Diesel in Treatment: 0 Height (in): 73 Weight (lbs): 205 Body Mass Index (BMI): 27 Nutrition Risk Assessment Items NUTRITION RISK SCREEN: I have an illness or condition that made me change the kind and/or 0 No amount of food I eat I eat fewer than two  meals per day 0 No I eat few fruits and vegetables, or milk products 0 No I have three or more drinks of beer, liquor or wine almost every day 0 No I have tooth or mouth problems that make it hard for me to eat 0 No I don't always have enough money to buy the food I need 0 No I eat alone most of the time 0 No I take three or more different prescribed or over-the-counter drugs a 1 Yes day Without wanting to, I have lost or gained 10 pounds in the last six 0 No months I am not always physically able to shop, cook and/or feed myself 0 No Nutrition Protocols Good Risk Protocol 0 No interventions needed Moderate Risk Protocol Electronic Signature(s) Signed: 03/01/2016 8:48:59 AM By: Montey Hora Entered By: Montey Hora on 03/01/2016 08:48:59

## 2016-03-01 NOTE — Progress Notes (Addendum)
Bradley [name], Bradley [name] (Bradley [name]) Visit Report for 03/01/2016 Allergy List Details Patient Name: Bradley [name], Bradley [name]. Date of Service: 03/01/2016 8:45 AM Medical Record Number: Bradley [name] Patient Account Number: [id number] Date of Birth/Sex: 11-Dec-1945 (70 y.o. Male) Treating RN: Montey Hora Primary Care Physician: Bradley [name] Other Clinician: Referring Physician: Dion [name] Treating Physician/Extender: Bradley [name] in Treatment: 0 Allergies Active Allergies No Known Allergies Allergy Notes Electronic Signature(s) Signed: 03/01/2016 8:47:02 AM By: Montey Hora Entered By: Montey Hora on 03/01/2016 08:47:02 Bradley [name] (Bradley [name]) -------------------------------------------------------------------------------- Arrival Information Details Patient Name: Bradley Loa A. Date of Service: 03/01/2016 8:45 AM Medical Record Number: Bradley [name] Patient Account Number: [id number] Date of Birth/Sex: 09-02-1945 (70 y.o. Male) Treating RN: Montey Hora Primary Care Physician: Bradley [name] Other Clinician: Referring Physician: Dion [name] Treating Physician/Extender: Bradley [name] in Treatment: 0 Visit Information Patient Arrived: Wheel Chair Arrival Time: 08:47 Accompanied By: self Transfer Assistance: Hoyer Lift Patient Identification Verified: Yes Secondary Verification Process Yes Completed: History Since Last Visit Added or deleted any medications: No Any new allergies or adverse reactions: No Had a fall or experienced change in activities of daily living that may affect risk of falls: No Signs or symptoms of abuse/neglect since last visito No Hospitalized since last visit: No Electronic Signature(s) Signed: 03/01/2016 5:05:30 PM By: Montey Hora Entered By: Montey Hora on 03/01/2016 09:01:16 Bradley [name] (Bradley [name]) -------------------------------------------------------------------------------- Clinic Level of Care  Assessment Details Patient Name: Bradley Loa A. Date of Service: 03/01/2016 8:45 AM Medical Record Number: Bradley [name] Patient Account Number: [id number] Date of Birth/Sex: 1945/11/27 (70 y.o. Male) Treating RN: Montey Hora Primary Care Physician: Bradley [name] Other Clinician: Referring Physician: Dion [name] Treating Physician/Extender: Bradley [name] in Treatment: 0 Clinic Level of Care Assessment Items TOOL 1 Quantity Score []  - Use when EandM and Procedure is performed on INITIAL visit 0 ASSESSMENTS - Nursing Assessment / Reassessment X - General Physical Exam (combine w/ comprehensive assessment (listed just 1 20 below) when performed on new pt. evals) X - Comprehensive Assessment (HX, ROS, Risk Assessments, Wounds Hx, etc.) 1 25 ASSESSMENTS - Wound and Skin Assessment / Reassessment []  - Dermatologic / Skin Assessment (not related to wound area) 0 ASSESSMENTS - Ostomy and/or Continence Assessment and Care []  - Incontinence Assessment and Management 0 []  - Ostomy Care Assessment and Management (repouching, etc.) 0 PROCESS - Coordination of Care X - Simple Patient / Family Education for ongoing care 1 15 []  - Complex (extensive) Patient / Family Education for ongoing care 0 X - Staff obtains Programmer, systems, Records, Test Results / Process Orders 1 10 []  - Staff telephones HHA, Nursing Homes / Clarify orders / etc 0 []  - Routine Transfer to another Facility (non-emergent condition) 0 []  - Routine Hospital Admission (non-emergent condition) 0 X - New Admissions / Biomedical engineer / Ordering NPWT, Apligraf, etc. 1 15 []  - Emergency Hospital Admission (emergent condition) 0 PROCESS - Special Needs []  - Pediatric / Minor Patient Management 0 []  - Isolation Patient Management 0 Bradley [name], Bradley A. (Bradley [name]) []  - Hearing / Language / Visual special needs 0 []  - Assessment of Community assistance (transportation, D/C planning, etc.) 0 X - Additional  assistance / Altered mentation 1 15 X - Support Surface(s) Assessment (bed, cushion, seat, etc.) 1 15 INTERVENTIONS - Miscellaneous []  - External ear exam 0 []  - Patient Transfer (multiple staff / Civil Service fast streamer / Similar devices) 0 []  - Simple Staple / Suture removal (25 or less) 0 []  - Complex Staple / Suture  removal (26 or more) 0 []  - Hypo/Hyperglycemic Management (do not check if billed separately) 0 []  - Ankle / Brachial Index (ABI) - do not check if billed separately 0 Has the patient been seen at the hospital within the last three years: Yes Total Score: 115 Level Of Care: New/Established - Level 3 Electronic Signature(s) Signed: 03/01/2016 5:05:30 PM By: Montey Hora Entered By: Montey Hora on 03/01/2016 09:33:10 Bradley [name] (Bradley [name]) -------------------------------------------------------------------------------- Encounter Discharge Information Details Patient Name: Bradley Loa A. Date of Service: 03/01/2016 8:45 AM Medical Record Number: Bradley [name] Patient Account Number: [id number] Date of Birth/Sex: September 23, 1945 (70 y.o. Male) Treating RN: Montey Hora Primary Care Physician: Bradley [name] Other Clinician: Referring Physician: Dion [name] Treating Physician/Extender: Bradley [name] in Treatment: 0 Encounter Discharge Information Items Discharge Pain Level: 0 Discharge Condition: Stable Ambulatory Status: Wheelchair Discharge Destination: Home Transportation: Private Auto Accompanied By: self Schedule Follow-up Appointment: Yes Medication Reconciliation completed and provided to Patient/Care No Bradley [name]: Provided on Clinical Summary of Care: 03/01/2016 Form Type Recipient Paper Patient JG Electronic Signature(s) Signed: 03/01/2016 10:22:43 AM By: Bradley [name] Entered By: Bradley [name] on 03/01/2016 10:22:43 Bradley [name] (Bradley [name]) -------------------------------------------------------------------------------- Multi Wound  Chart Details Patient Name: Bradley Loa A. Date of Service: 03/01/2016 8:45 AM Medical Record Number: Bradley [name] Patient Account Number: [id number] Date of Birth/Sex: Jan 18, 1946 (70 y.o. Male) Treating RN: Montey Hora Primary Care Physician: Bradley [name] Other Clinician: Referring Physician: Dion [name] Treating Physician/Extender: Bradley [name] in Treatment: 0 Vital Signs Height(in): Pulse(bpm): 82 Weight(lbs): Blood Pressure 114/88 (mmHg): [name] Mass Index(BMI): Temperature(F): 98.3 Respiratory Rate 16 (breaths/min): Photos: [5:No Photos] [N/A:N/A] Wound Location: [5:Sacrum] [N/A:N/A] Wounding Event: [5:Pressure Injury] [N/A:N/A] Primary Etiology: [5:Pressure Ulcer] [N/A:N/A] Comorbid History: [5:Hypertension, Rheumatoid Arthritis, Paraplegia] [N/A:N/A] Date Acquired: [5:11/05/2014] [N/A:N/A] [name] of Treatment: [5:0] [N/A:N/A] Wound Status: [5:Open] [N/A:N/A] Measurements L x W x D 12.5x7x2.4 [N/A:N/A] (cm) Area (cm) : [id number] [N/A:N/A] Volume (cm) : [id number] [N/A:N/A] Classification: [5:Category/Stage III] [N/A:N/A] Exudate Amount: [5:Large] [N/A:N/A] Exudate Type: [5:Serous] [N/A:N/A] Exudate Color: [5:amber] [N/A:N/A] Wound Margin: [5:Thickened] [N/A:N/A] Granulation Amount: [5:Large (67-100%)] [N/A:N/A] Granulation Quality: [5:Red] [N/A:N/A] Necrotic Amount: [5:None Present (0%)] [N/A:N/A] Exposed Structures: [5:Fat: Yes Fascia: No Tendon: No Muscle: No Joint: No Bone: No] [N/A:N/A] Epithelialization: [5:None] [N/A:N/A] Periwound Skin Texture: [N/A:N/A] Edema: No Excoriation: No Induration: No Callus: No Crepitus: No Fluctuance: No Friable: No Rash: No Scarring: No Periwound Skin Maceration: Yes N/A N/A Moisture: Moist: Yes Dry/Scaly: No Periwound Skin Color: Atrophie Blanche: No N/A N/A Cyanosis: No Ecchymosis: No Erythema: No Hemosiderin Staining: No Mottled: No Pallor: No Rubor: No Temperature: No  Abnormality N/A N/A Tenderness on No N/A N/A Palpation: Wound Preparation: Ulcer Cleansing: N/A N/A Rinsed/Irrigated with Saline Topical Anesthetic Applied: None Treatment Notes Electronic Signature(s) Signed: 03/01/2016 5:05:30 PM By: Montey Hora Entered By: Montey Hora on 03/01/2016 09:28:08 Bradley [name] (Bradley [name]) -------------------------------------------------------------------------------- [place] Details Patient Name: Bradley Loa A. Date of Service: 03/01/2016 8:45 AM Medical Record Number: Bradley [name] Patient Account Number: [id number] Date of Birth/Sex: 01/09/1946 (69 y.o. Male) Treating RN: Montey Hora Primary Care Physician: Bradley [name] Other Clinician: Referring Physician: Dion [name] Treating Physician/Extender: Bradley [name] in Treatment: 0 Active Inactive Abuse / Safety / Falls / Self Care Management Nursing Diagnoses: Impaired physical mobility Potential for falls Goals: Patient will remain injury free Date Initiated: 03/01/2016 Goal Status: Active Interventions: Assess fall risk on admission and as needed Notes: Pressure Nursing Diagnoses: Knowledge deficit related to causes and risk factors for pressure ulcer development Goals: Patient  will remain free from development of additional pressure ulcers Date Initiated: 03/01/2016 Goal Status: Active Interventions: Provide education on pressure ulcers Notes: Wound/Skin Impairment Nursing Diagnoses: Impaired tissue integrity Goals: Patient/caregiver will verbalize understanding of skin care regimen Bradley [name], Bradley [name] (Bradley [name]) Date Initiated: 03/01/2016 Goal Status: Active Ulcer/skin breakdown will have a volume reduction of 30% by week 4 Date Initiated: 03/01/2016 Goal Status: Active Ulcer/skin breakdown will have a volume reduction of 50% by week 8 Date Initiated: 03/01/2016 Goal Status: Active Ulcer/skin breakdown will have a volume  reduction of 80% by week 12 Date Initiated: 03/01/2016 Goal Status: Active Ulcer/skin breakdown will heal within 14 [name] Date Initiated: 03/01/2016 Goal Status: Active Interventions: Assess patient/caregiver ability to obtain necessary supplies Assess patient/caregiver ability to perform ulcer/skin care regimen upon admission and as needed Assess ulceration(s) every visit Notes: Electronic Signature(s) Signed: 03/01/2016 5:05:30 PM By: Montey Hora Entered By: Montey Hora on 03/01/2016 09:27:51 Bradley [name] (Bradley [name]) -------------------------------------------------------------------------------- Pain Assessment Details Patient Name: Bradley Loa A. Date of Service: 03/01/2016 8:45 AM Medical Record Number: Bradley [name] Patient Account Number: [id number] Date of Birth/Sex: 24-Jun-1945 (70 y.o. Male) Treating RN: Montey Hora Primary Care Physician: Bradley [name] Other Clinician: Referring Physician: Dion [name] Treating Physician/Extender: Bradley [name] in Treatment: 0 Active Problems Location of Pain Severity and Description of Pain Patient Has Paino No Site Locations Pain Management and Medication Current Pain Management: Electronic Signature(s) Signed: 03/01/2016 5:05:30 PM By: Montey Hora Entered By: Montey Hora on 03/01/2016 09:01:22 Bradley [name] (Bradley [name]) -------------------------------------------------------------------------------- Patient/Caregiver Education Details Patient Name: Bradley Loa A. Date of Service: 03/01/2016 8:45 AM Medical Record Number: Bradley [name] Patient Account Number: [id number] Date of Birth/Gender: [birth year] (70 y.o. Male) Treating RN: Montey Hora Primary Care Physician: Bradley [name] Other Clinician: Referring Physician: Dion [name] Treating Physician/Extender: Bradley [name] in Treatment: 0 Education Assessment Education Provided To: Patient Education Topics  Provided Nutrition: Handouts: Nutrition Methods: Explain/Verbal Responses: State content correctly Offloading: Handouts: Other: offloading wound Methods: Explain/Verbal Responses: State content correctly Wound/Skin Impairment: Handouts: Other: wound care as ordered Methods: Demonstration, Explain/Verbal Responses: State content correctly Electronic Signature(s) Signed: 03/01/2016 5:05:30 PM By: Montey Hora Entered By: Montey Hora on 03/01/2016 09:34:05 Bradley [name] (Bradley [name]) -------------------------------------------------------------------------------- Wound Assessment Details Patient Name: Bradley Loa A. Date of Service: 03/01/2016 8:45 AM Medical Record Number: Bradley [name] Patient Account Number: [id number] Date of Birth/Sex: 12-10-1945 (70 y.o. Male) Treating RN: Montey Hora Primary Care Physician: Bradley [name] Other Clinician: Referring Physician: Dion [name] Treating Physician/Extender: Bradley [name] in Treatment: 0 Wound Status Wound Number: 5 Primary Pressure Ulcer Etiology: Wound Location: Sacrum Wound Status: Open Wounding Event: Pressure Injury Comorbid Hypertension, Rheumatoid Arthritis, Date Acquired: 11/05/2014 History: Paraplegia [name] Of Treatment: 0 Clustered Wound: No Photos Wound Measurements Length: (cm) 12.5 Width: (cm) 7 Depth: (cm) 2.4 Area: (cm) 68.722 Volume: (cm) 164.934 % Reduction in Area: 0% % Reduction in Volume: 0% Epithelialization: None Tunneling: No Undermining: No Wound Description Classification: Category/Stage III Wound Margin: Thickened Exudate Amount: Large Exudate Type: Serous Exudate Color: amber Foul Odor After Cleansing: No Wound Bed Granulation Amount: Medium (34-66%) Exposed Structure Granulation Quality: Red Fascia Exposed: No Necrotic Amount: Medium (34-66%) Fat Layer Exposed: Yes Necrotic Quality: Adherent Slough Tendon Exposed: No Muscle Exposed: No CHAOS, POKE A. (Bradley [name]) Joint Exposed: No Bone Exposed: No Periwound Skin Texture Texture Color No Abnormalities Noted: No No Abnormalities Noted: No Callus: No Atrophie Blanche: No Crepitus: No Cyanosis: No Excoriation: No Ecchymosis: No Fluctuance: No Erythema: No Friable: No Hemosiderin Staining:  No Induration: No Mottled: No Localized Edema: No Pallor: No Rash: No Rubor: No Scarring: No Temperature / Pain Moisture Temperature: No Abnormality No Abnormalities Noted: No Dry / Scaly: No Maceration: Yes Moist: Yes Wound Preparation Ulcer Cleansing: Rinsed/Irrigated with Saline Topical Anesthetic Applied: None Treatment Notes Wound #5 (Sacrum) 1. Cleansed with: Clean wound with Normal Saline 2. Anesthetic Topical Lidocaine 4% cream to wound bed prior to debridement 4. Dressing Applied: Medihoney Gel 5. Secondary Dressing Applied Bordered Foam Dressing Dry Gauze Electronic Signature(s) Signed: 03/01/2016 5:05:30 PM By: Montey Hora Entered By: Montey Hora on 03/01/2016 10:35:18 Bradley [name] ([id number]) -------------------------------------------------------------------------------- [place] Details Patient Name: Bradley Loa A. Date of Service: 03/01/2016 8:45 AM Medical Record Number: [id number] Patient Account Number: [id number] Date of Birth/Sex: 1945/08/24 (70 y.o. Male) Treating RN: Montey Hora Primary Care Physician: Bradley [name] Other Clinician: Referring Physician: Dion [name] Treating Physician/Extender: Bradley [name] in Treatment: 0 Vital Signs Time Taken: 09:01 Temperature (F): 98.3 Unable to obtain height and weight: Medical Reason Pulse (bpm): 82 Respiratory Rate (breaths/min): 16 Blood Pressure (mmHg): 114/88 Reference Range: 80 - 120 mg / dl Electronic Signature(s) Signed: 03/01/2016 5:05:30 PM By: Montey Hora Entered By: Montey Hora on 03/01/2016 09:01:56

## 2016-03-02 NOTE — Progress Notes (Signed)
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 agents. Furthermore this point I don't think he could undergo further plastic surgery until the surface of this wound was healthier. The size of it limits our alternatives here Electronic Signature(s) Signed: 03/02/2016 8:02:21 AM By: Linton Ham MD Entered By: Linton Ham on 03/01/2016 10:45:54 [name] -------------------------------------------------------------------------------- ROS/PFSH Details Patient Name: Bradley Loa A. Date of Service: 03/01/2016 8:45 AM Medical Record Patient Account Number: [id number] Number: Treating RN: Montey Hora 09-Aug-1945 (70 y.o. Other Clinician: Date of  Birth/Sex: Male) Treating ROBSON, Morton Primary Care Physician: [name] Physician/Extender: G Referring Physician: Abelardo Diesel in Treatment: 0 Label Progress Note Print Version as History and Physical for this encounter Information Obtained From Patient Wound History Do you currently have one or more open woundso Yes How many open wounds do you currently haveo 1 Approximately how long have you had your woundso 2 years How have you been treating your wound(s) until nowo wet to dry dressing Has your wound(s) ever healed and then re-openedo No Have you had any lab work done in the past montho No Have you tested positive for an antibiotic resistant organism (MRSA, VRE)o No Have you tested positive for osteomyelitis (bone infection)o No Have you had any tests for circulation on your legso No Constitutional Symptoms (General Health) Complaints and Symptoms: No Complaints or Symptoms Eyes Complaints and Symptoms: No Complaints or Symptoms Ear/Nose/Mouth/Throat Complaints and Symptoms: No Complaints or Symptoms Hematologic/Lymphatic Complaints and Symptoms: No Complaints or Symptoms Respiratory Complaints and Symptoms: No Complaints or Symptoms Cardiovascular AYVEN, CAHAN ([id number]) Medical History: Positive for: Hypertension Gastrointestinal Complaints and Symptoms: No Complaints or Symptoms Endocrine Complaints and Symptoms: No Complaints or Symptoms Genitourinary Complaints and Symptoms: No Complaints or Symptoms Medical History: Past Medical History Notes: frequent urination Immunological Complaints and Symptoms: No Complaints or Symptoms Integumentary (Skin) Complaints and Symptoms: No Complaints or Symptoms Musculoskeletal Medical History: Positive for: Rheumatoid Arthritis Neurologic Medical History: Positive for: Paraplegia Oncologic Medical History: Negative for: Received Chemotherapy; Received  Radiation Psychiatric Medical History: Negative for: Anorexia/bulimia; Confinement Anxiety RAMAR, GRASS ([id number]) Immunizations Pneumococcal Vaccine: Received Pneumococcal Vaccination: Yes Immunization Notes: up to date Hospitalization / Surgery History Name of Hospital Purpose of Hospitalization/Surgery Date [facility] 06/29/2013 Family and Social History Cancer: Yes - Mother; Diabetes: No; Heart Disease: Yes - Mother; Hereditary Spherocytosis: No; Hypertension: No; Kidney Disease: No; Lung Disease: No; Seizures: Yes - Child; Stroke: No; Thyroid Problems: No; Tuberculosis: No; Former smoker; Marital Status - Single; Alcohol Use: Never; Drug Use: No History; Caffeine Use: Moderate - coffee; Financial Concerns: No; Food, Clothing or Shelter Needs: No; Support System Lacking: No; Transportation Concerns: No; Advanced Directives: No; Patient does not want information on Advanced Directives; Do not resuscitate: No; Living Will: No; Medical Power of Attorney: No Electronic Signature(s) Signed: 03/01/2016 5:05:30 PM By: Montey Hora Signed: 03/02/2016 8:02:21 AM By: Linton Ham MD Entered By: Montey Hora on 03/01/2016 09:03:34 [name] -------------------------------------------------------------------------------- SuperBill Details  Patient Name: ODYN, SIAM. Date of Service: 03/01/2016 Medical Record Patient Account Number: [id number] Number: Treating RN: Montey Hora 1946-02-20 (70 y.o. Other Clinician: Date of Birth/Sex: Male) Treating ROBSON, Ellis Primary Care Physician: [name] Physician/Extender: G Referring Physician: Abelardo Diesel in Treatment: 0 Diagnosis Coding ICD-10 Codes Code Description L89.153 Pressure ulcer of sacral region, stage 3 G82.21 Paraplegia, complete Facility Procedures CPT4 Code: [id number] Description: [place] VISIT-LEV 3 EST PT Modifier: Quantity: 1 CPT4 Code:  [id number] Description: 11042 - DEB SUBQ TISSUE 20 SQ CM/< ICD-10 Description Diagnosis L89.153 Pressure ulcer of sacral region, stage 3 Modifier: Quantity: 1 CPT4 Code: [id number] Description: 11045 - DEB SUBQ TISS EA ADDL 20CM ICD-10 Description Diagnosis L89.153 Pressure ulcer of sacral region, stage 3 Modifier: Quantity: 4 Physician Procedures CPT4 Code: [id number] Description: 11042 - WC PHYS SUBQ TISS 20 SQ CM ICD-10 Description Diagnosis L89.153 Pressure ulcer of sacral region, stage 3 Modifier: Quantity: 1 CPT4 Code: [id number] Description: [id number] - WC PHYS SUBQ TISS EA ADDL 20 CM ICD-10 Description Diagnosis L89.153 Pressure ulcer of sacral region, stage 3 Modifier: Quantity: 4 Electronic Signature(s) BOOMER, DODARO ([id number]) Signed: 03/02/2016 8:02:21 AM By: Linton Ham MD Entered By: Linton Ham on 03/01/2016 10:46:20

## 2016-03-03 DIAGNOSIS — G8221 Paraplegia, complete: Secondary | ICD-10-CM | POA: Diagnosis not present

## 2016-03-03 DIAGNOSIS — L89153 Pressure ulcer of sacral region, stage 3: Secondary | ICD-10-CM | POA: Diagnosis not present

## 2016-03-03 DIAGNOSIS — M069 Rheumatoid arthritis, unspecified: Secondary | ICD-10-CM | POA: Diagnosis not present

## 2016-03-03 DIAGNOSIS — I10 Essential (primary) hypertension: Secondary | ICD-10-CM | POA: Diagnosis not present

## 2016-03-03 DIAGNOSIS — Z4800 Encounter for change or removal of nonsurgical wound dressing: Secondary | ICD-10-CM | POA: Diagnosis not present

## 2016-03-03 DIAGNOSIS — Z87828 Personal history of other (healed) physical injury and trauma: Secondary | ICD-10-CM | POA: Diagnosis not present

## 2016-03-09 DIAGNOSIS — Z87828 Personal history of other (healed) physical injury and trauma: Secondary | ICD-10-CM | POA: Diagnosis not present

## 2016-03-09 DIAGNOSIS — G8221 Paraplegia, complete: Secondary | ICD-10-CM | POA: Diagnosis not present

## 2016-03-09 DIAGNOSIS — L89209 Pressure ulcer of unspecified hip, unspecified stage: Secondary | ICD-10-CM | POA: Diagnosis not present

## 2016-03-09 DIAGNOSIS — I10 Essential (primary) hypertension: Secondary | ICD-10-CM | POA: Diagnosis not present

## 2016-03-09 DIAGNOSIS — L89153 Pressure ulcer of sacral region, stage 3: Secondary | ICD-10-CM | POA: Diagnosis not present

## 2016-03-09 DIAGNOSIS — M069 Rheumatoid arthritis, unspecified: Secondary | ICD-10-CM | POA: Diagnosis not present

## 2016-03-09 DIAGNOSIS — Z4800 Encounter for change or removal of nonsurgical wound dressing: Secondary | ICD-10-CM | POA: Diagnosis not present

## 2016-03-15 ENCOUNTER — Encounter: Payer: Commercial Managed Care - HMO | Attending: Physician Assistant | Admitting: Nurse Practitioner

## 2016-03-15 DIAGNOSIS — G8221 Paraplegia, complete: Secondary | ICD-10-CM | POA: Diagnosis not present

## 2016-03-15 DIAGNOSIS — L89153 Pressure ulcer of sacral region, stage 3: Secondary | ICD-10-CM | POA: Diagnosis not present

## 2016-03-15 DIAGNOSIS — M069 Rheumatoid arthritis, unspecified: Secondary | ICD-10-CM | POA: Diagnosis not present

## 2016-03-15 DIAGNOSIS — I10 Essential (primary) hypertension: Secondary | ICD-10-CM | POA: Diagnosis not present

## 2016-03-15 NOTE — Progress Notes (Addendum)
TERRALL, HINZMAN ([id number]) Visit Report for 03/15/2016 Arrival Information Details Patient Name: Bradley [name], Bradley [name]. Date of Service: 03/15/2016 10:45 AM Medical Record Number: [id number] Patient Account Number: [id number] Date of Birth/Sex: April 10, 1946 (70 y.o. Male) Treating RN: Montey Hora Primary Care Physician: [name] Other Clinician: Referring Physician: [name] Treating Physician/Extender: Loistine Chance in Treatment: 2 Visit Information History Since Last Visit Added or deleted any medications: No Patient Arrived: Wheel Chair Any new allergies or adverse reactions: No Arrival Time: 10:44 Had a fall or experienced change in No activities of daily living that may affect Accompanied By: self risk of falls: Transfer Assistance: Hoyer Lift Signs or symptoms of abuse/neglect since last No Patient Identification Verified: Yes visito Secondary Verification Process Yes Hospitalized since last visit: No Completed: Pain Present Now: No Electronic Signature(s) Signed: 03/15/2016 4:06:00 PM By: Montey Hora Entered By: Montey Hora on 03/15/2016 10:44:50 [name] -------------------------------------------------------------------------------- Encounter Discharge Information Details Patient Name: Bradley Loa A. Date of Service: 03/15/2016 10:45 AM Medical Record Number: [id number] Patient Account Number: [id number] Date of Birth/Sex: [birth year] (70 y.o. Male) Treating RN: Montey Hora Primary Care Physician: [name] Other Clinician: Referring Physician: [name] Treating Physician/Extender: [name], HOYT Weeks in Treatment: 2 Encounter Discharge Information Items Discharge Pain Level: 0 Discharge Condition: Stable Ambulatory Status: Wheelchair Discharge Destination: Home Transportation: Private Auto Accompanied By: self Schedule Follow-up Appointment: Yes Medication Reconciliation  completed and provided to Patient/Care No Demorio Seeley: Provided on Clinical Summary of Care: 03/15/2016 Form Type Recipient Paper Patient JG Electronic Signature(s) Signed: 03/15/2016 12:44:33 PM By: Montey Hora Previous Signature: 03/15/2016 11:29:16 AM Version By: Ruthine Dose Entered By: Montey Hora on 03/15/2016 12:44:32 [name] -------------------------------------------------------------------------------- Multi Wound Chart Details Patient Name: Bradley Loa A. Date of Service: 03/15/2016 10:45 AM Medical Record Number: [id number] Patient Account Number: [id number] Date of Birth/Sex: 1945-11-23 (70 y.o. Male) Treating RN: Montey Hora Primary Care Physician: [name] Other Clinician: Referring Physician: [name] Treating Physician/Extender: [name], HOYT Weeks in Treatment: 2 Vital Signs Height(in): Pulse(bpm): 81 Weight(lbs): Blood Pressure 130/59 (mmHg): Body Mass Index(BMI): Temperature(F): 98.1 Respiratory Rate 16 (breaths/min): Photos: [N/A:N/A] Wound Location: Sacrum N/A N/A Wounding Event: Pressure Injury N/A N/A Primary Etiology: Pressure Ulcer N/A N/A Comorbid History: Hypertension, N/A N/A Rheumatoid Arthritis, Paraplegia Date Acquired: 11/05/2014 N/A N/A Weeks of Treatment: 2 N/A N/A Wound Status: Open N/A N/A Measurements L x W x D 12.3x9.6x3 N/A N/A (cm) Area (cm) : 92.74 N/A N/A Volume (cm) : 278.219 N/A N/A % Reduction in Area: -34.90% N/A N/A % Reduction in Volume: -68.70% N/A N/A Classification: Category/Stage III N/A N/A Exudate Amount: Large N/A N/A Exudate Type: Serous N/A N/A Exudate Color: amber N/A N/A Wound Margin: Thickened N/A N/A Granulation Amount: Medium (34-66%) N/A N/A Granulation Quality: Red N/A N/A Necrotic Amount: Medium (34-66%) N/A N/A Bradley [name], Bradley A. ([id number]) Exposed Structures: Fat: Yes N/A N/A Fascia: No Tendon: No Muscle: No Joint: No Bone:  No Epithelialization: None N/A N/A Debridement: Debridement [id number] N/A N/A 11047) Pre-procedure 11:06 N/A N/A Verification/Time Out Taken: Pain Control: Lidocaine 4% Topical N/A N/A Solution Tissue Debrided: Fibrin/Slough, Skin, N/A N/A Subcutaneous Level: Skin/Subcutaneous N/A N/A Tissue Debridement Area (sq 118.08 N/A N/A cm): Instrument: Curette, Forceps, Scissors N/A N/A Bleeding: Minimum N/A N/A Hemostasis Achieved: Pressure N/A N/A Procedural Pain: 0 N/A N/A Post Procedural Pain: 0 N/A N/A Debridement Treatment Procedure was tolerated N/A N/A Response: well Post Debridement 12.3x9.6x3 N/A N/A Measurements L x W x D (cm) Post  Debridement 278.219 N/A N/A Volume: (cm) Post Debridement Category/Stage III N/A N/A Stage: Periwound Skin Texture: Edema: No N/A N/A Excoriation: No Induration: No Callus: No Crepitus: No Fluctuance: No Friable: No Rash: No Scarring: No Periwound Skin Maceration: Yes N/A N/A Moisture: Moist: Yes Dry/Scaly: No Periwound Skin Color: Atrophie Blanche: No N/A N/A Cyanosis: No Ecchymosis: No Erythema: No Bradley [name], Bradley A. ([id number]) Hemosiderin Staining: No Mottled: No Pallor: No Rubor: No Temperature: No Abnormality N/A N/A Tenderness on No N/A N/A Palpation: Wound Preparation: Ulcer Cleansing: N/A N/A Rinsed/Irrigated with Saline Topical Anesthetic Applied: Other: lidocaine 4% Procedures Performed: Debridement N/A N/A Treatment Notes Wound #5 (Sacrum) 1. Cleansed with: Clean wound with Normal Saline 2. Anesthetic Topical Lidocaine 4% cream to wound bed prior to debridement 4. Dressing Applied: Medihoney Gel 5. Secondary Dressing Applied Bordered Foam Dressing Dry Gauze 7. Secured with Microbiologist) Signed: 03/15/2016 12:43:51 PM By: Montey Hora Entered By: Montey Hora on 03/15/2016 12:43:51 [name]  ([id number]) -------------------------------------------------------------------------------- [place] Details Patient Name: Bradley [name], Bradley A. Date of Service: 03/15/2016 10:45 AM Medical Record Number: [id number] Patient Account Number: [id number] Date of Birth/Sex: 1945/12/15 (70 y.o. Male) Treating RN: Montey Hora Primary Care Physician: [name] Other Clinician: Referring Physician: [name] Treating Physician/Extender: [name], HOYT Weeks in Treatment: 2 Active Inactive Abuse / Safety / Falls / Self Care Management Nursing Diagnoses: Impaired physical mobility Potential for falls Goals: Patient will remain injury free Date Initiated: 03/01/2016 Goal Status: Active Interventions: Assess fall risk on admission and as needed Notes: Pressure Nursing Diagnoses: Knowledge deficit related to causes and risk factors for pressure ulcer development Goals: Patient will remain free from development of additional pressure ulcers Date Initiated: 03/01/2016 Goal Status: Active Interventions: Provide education on pressure ulcers Notes: Wound/Skin Impairment Nursing Diagnoses: Impaired tissue integrity Goals: Patient/caregiver will verbalize understanding of skin care regimen Bradley [name], Bradley [name] ([id number]) Date Initiated: 03/01/2016 Goal Status: Active Ulcer/skin breakdown will have a volume reduction of 30% by week 4 Date Initiated: 03/01/2016 Goal Status: Active Ulcer/skin breakdown will have a volume reduction of 50% by week 8 Date Initiated: 03/01/2016 Goal Status: Active Ulcer/skin breakdown will have a volume reduction of 80% by week 12 Date Initiated: 03/01/2016 Goal Status: Active Ulcer/skin breakdown will heal within 14 weeks Date Initiated: 03/01/2016 Goal Status: Active Interventions: Assess patient/caregiver ability to obtain necessary supplies Assess patient/caregiver ability to perform ulcer/skin care regimen upon admission  and as needed Assess ulceration(s) every visit Notes: Electronic Signature(s) Signed: 03/15/2016 12:43:36 PM By: Montey Hora Entered By: Montey Hora on 03/15/2016 12:43:35 [name] -------------------------------------------------------------------------------- Pain Assessment Details Patient Name: Bradley Loa A. Date of Service: 03/15/2016 10:45 AM Medical Record Number: [id number] Patient Account Number: [id number] Date of Birth/Sex: 28-Jul-1945 (70 y.o. Male) Treating RN: Montey Hora Primary Care Physician: [name] Other Clinician: Referring Physician: [name] Treating Physician/Extender: Loistine Chance in Treatment: 2 Active Problems Location of Pain Severity and Description of Pain Patient Has Paino No Site Locations Pain Management and Medication Current Pain Management: Notes Topical or injectable lidocaine is offered to patient for acute pain when surgical debridement is performed. If needed, Patient is instructed to use over the counter pain medication for the following 24-48 hours after debridement. Wound care MDs do not prescribed pain medications. Patient has chronic pain or uncontrolled pain. Patient has been instructed to make an appointment with their Primary Care Physician for pain management. Electronic Signature(s) Signed: 03/15/2016 4:06:00 PM By: Montey Hora Entered By: Marjory Lies,  Joanna on 03/15/2016 10:45:28 Bradley [name], Bradley [name] ([id number]) -------------------------------------------------------------------------------- Patient/Caregiver Education Details Patient Name: Bradley [name], Bradley A. Date of Service: 03/15/2016 10:45 AM Medical Record Number: [id number] Patient Account Number: [id number] Date of Birth/Gender: [birth year] (70 y.o. Male) Treating RN: Montey Hora Primary Care Physician: [name] Other Clinician: Referring Physician: [name] Treating Physician/Extender: Sharalyn Ink in Treatment: 2 Education Assessment Education Provided To: Patient Education Topics Provided Offloading: Handouts: Other: continue pressure relief on bottom Methods: Explain/Verbal Responses: State content correctly Electronic Signature(s) Signed: 03/15/2016 4:06:00 PM By: Montey Hora Entered By: Montey Hora on 03/15/2016 12:44:39 [name] -------------------------------------------------------------------------------- Wound Assessment Details Patient Name: Bradley Loa A. Date of Service: 03/15/2016 10:45 AM Medical Record Number: [id number] Patient Account Number: [id number] Date of Birth/Sex: [birth year] (70 y.o. Male) Treating RN: Montey Hora Primary Care Physician: [name] Other Clinician: Referring Physician: [name] Treating Physician/Extender: Loistine Chance in Treatment: 2 Wound Status Wound Number: 5 Primary Pressure Ulcer Etiology: Wound Location: Sacrum Wound Status: Open Wounding Event: Pressure Injury Comorbid Hypertension, Rheumatoid Arthritis, Date Acquired: 11/05/2014 History: Paraplegia Weeks Of Treatment: 2 Clustered Wound: No Photos Wound Measurements Length: (cm) 12.3 Width: (cm) 9.6 Depth: (cm) 3 Area: (cm) 92.74 Volume: (cm) 278.219 % Reduction in Area: -34.9% % Reduction in Volume: -68.7% Epithelialization: None Tunneling: No Undermining: No Wound Description Classification: Category/Stage III Wound Margin: Thickened Exudate Amount: Large Exudate Type: Serous Exudate Color: amber Foul Odor After Cleansing: No Wound Bed Granulation Amount: Medium (34-66%) Exposed Structure Granulation Quality: Red Fascia Exposed: No Necrotic Amount: Medium (34-66%) Fat Layer Exposed: Yes Necrotic Quality: Adherent Slough Tendon Exposed: No Muscle Exposed: No Bradley [name], Bradley A. ([id number]) Joint Exposed: No Bone Exposed: No Periwound Skin Texture Texture Color No  Abnormalities Noted: No No Abnormalities Noted: No Callus: No Atrophie Blanche: No Crepitus: No Cyanosis: No Excoriation: No Ecchymosis: No Fluctuance: No Erythema: No Friable: No Hemosiderin Staining: No Induration: No Mottled: No Localized Edema: No Pallor: No Rash: No Rubor: No Scarring: No Temperature / Pain Moisture Temperature: No Abnormality No Abnormalities Noted: No Dry / Scaly: No Maceration: Yes Moist: Yes Wound Preparation Ulcer Cleansing: Rinsed/Irrigated with Saline Topical Anesthetic Applied: Other: lidocaine 4%, Treatment Notes Wound #5 (Sacrum) 1. Cleansed with: Clean wound with Normal Saline 2. Anesthetic Topical Lidocaine 4% cream to wound bed prior to debridement 4. Dressing Applied: Medihoney Gel 5. Secondary Dressing Applied Bordered Foam Dressing Dry Gauze 7. Secured with Microbiologist) Signed: 03/15/2016 4:06:00 PM By: Montey Hora Entered By: Montey Hora on 03/15/2016 10:58:27 [name] -------------------------------------------------------------------------------- Vitals Details Patient Name: Bradley Loa A. Date of Service: 03/15/2016 10:45 AM Medical Record Number: [id number] Patient Account Number: [id number] Date of Birth/Sex: 1945/08/01 (70 y.o. Male) Treating RN: Montey Hora Primary Care Physician: [name] Other Clinician: Referring Physician: [name] Treating Physician/Extender: [name], HOYT Weeks in Treatment: 2 Vital Signs Time Taken: 10:45 Temperature (F): 98.1 Pulse (bpm): 81 Respiratory Rate (breaths/min): 16 Blood Pressure (mmHg): 130/59 Reference Range: 80 - 120 mg / dl Electronic Signature(s) Signed: 03/15/2016 12:43:20 PM By: Montey Hora Entered By: Montey Hora on 03/15/2016 12:43:20

## 2016-03-16 DIAGNOSIS — L89153 Pressure ulcer of sacral region, stage 3: Secondary | ICD-10-CM | POA: Diagnosis not present

## 2016-03-16 DIAGNOSIS — Z4800 Encounter for change or removal of nonsurgical wound dressing: Secondary | ICD-10-CM | POA: Diagnosis not present

## 2016-03-16 DIAGNOSIS — G8221 Paraplegia, complete: Secondary | ICD-10-CM | POA: Diagnosis not present

## 2016-03-16 DIAGNOSIS — M069 Rheumatoid arthritis, unspecified: Secondary | ICD-10-CM | POA: Diagnosis not present

## 2016-03-16 DIAGNOSIS — Z87828 Personal history of other (healed) physical injury and trauma: Secondary | ICD-10-CM | POA: Diagnosis not present

## 2016-03-16 DIAGNOSIS — I10 Essential (primary) hypertension: Secondary | ICD-10-CM | POA: Diagnosis not present

## 2016-03-17 NOTE — Progress Notes (Signed)
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 ulcer of sacral region, stage 3 G82.21 Paraplegia, complete Facility Procedures CPT4 Code: [id number] Description: 11042 - DEB SUBQ TISSUE 20 SQ CM/< ICD-10 Description Diagnosis L89.153 Pressure ulcer of sacral region, stage 3 G82.21 Paraplegia, complete Modifier: Quantity: 1 CPT4 Code: [id number] Description: [id number] - DEB SUBQ TISS EA ADDL 20CM ICD-10 Description Diagnosis L89.153 Pressure ulcer of sacral region, stage 3 G82.21 Paraplegia, complete Modifier: Quantity: 5 Physician Procedures CPT4 Code: [id number] Description: 11042 - WC PHYS SUBQ TISS 20 SQ CM ICD-10 Description Diagnosis L89.153 Pressure ulcer of sacral region, stage 3 G82.21 Paraplegia, complete Modifier: Quantity: 1 CPT4 Code: [id number] Tamm, Zed A Description: 11045 - WC PHYS SUBQ TISS  EA ADDL 20 CM ICD-10 Description Diagnosis L89.153 Pressure ulcer of sacral region, stage 3 G82.21 Paraplegia, complete . ([id number]) Modifier: Quantity: 5 Electronic Signature(s) Signed: 03/16/2016 9:39:47 AM By: Worthy Keeler PA-C Entered By: Worthy Keeler on 03/15/2016 18:22:55

## 2016-03-18 DIAGNOSIS — G8221 Paraplegia, complete: Secondary | ICD-10-CM | POA: Diagnosis not present

## 2016-03-18 DIAGNOSIS — Z4800 Encounter for change or removal of nonsurgical wound dressing: Secondary | ICD-10-CM | POA: Diagnosis not present

## 2016-03-18 DIAGNOSIS — I10 Essential (primary) hypertension: Secondary | ICD-10-CM | POA: Diagnosis not present

## 2016-03-18 DIAGNOSIS — Z87828 Personal history of other (healed) physical injury and trauma: Secondary | ICD-10-CM | POA: Diagnosis not present

## 2016-03-18 DIAGNOSIS — M069 Rheumatoid arthritis, unspecified: Secondary | ICD-10-CM | POA: Diagnosis not present

## 2016-03-18 DIAGNOSIS — L89153 Pressure ulcer of sacral region, stage 3: Secondary | ICD-10-CM | POA: Diagnosis not present

## 2016-03-21 DIAGNOSIS — G8221 Paraplegia, complete: Secondary | ICD-10-CM | POA: Diagnosis not present

## 2016-03-21 DIAGNOSIS — Z87828 Personal history of other (healed) physical injury and trauma: Secondary | ICD-10-CM | POA: Diagnosis not present

## 2016-03-21 DIAGNOSIS — M069 Rheumatoid arthritis, unspecified: Secondary | ICD-10-CM | POA: Diagnosis not present

## 2016-03-21 DIAGNOSIS — Z4800 Encounter for change or removal of nonsurgical wound dressing: Secondary | ICD-10-CM | POA: Diagnosis not present

## 2016-03-21 DIAGNOSIS — L89153 Pressure ulcer of sacral region, stage 3: Secondary | ICD-10-CM | POA: Diagnosis not present

## 2016-03-21 DIAGNOSIS — I10 Essential (primary) hypertension: Secondary | ICD-10-CM | POA: Diagnosis not present

## 2016-03-23 DIAGNOSIS — G8221 Paraplegia, complete: Secondary | ICD-10-CM | POA: Diagnosis not present

## 2016-03-23 DIAGNOSIS — M069 Rheumatoid arthritis, unspecified: Secondary | ICD-10-CM | POA: Diagnosis not present

## 2016-03-23 DIAGNOSIS — Z87828 Personal history of other (healed) physical injury and trauma: Secondary | ICD-10-CM | POA: Diagnosis not present

## 2016-03-23 DIAGNOSIS — L89153 Pressure ulcer of sacral region, stage 3: Secondary | ICD-10-CM | POA: Diagnosis not present

## 2016-03-23 DIAGNOSIS — Z4800 Encounter for change or removal of nonsurgical wound dressing: Secondary | ICD-10-CM | POA: Diagnosis not present

## 2016-03-23 DIAGNOSIS — I10 Essential (primary) hypertension: Secondary | ICD-10-CM | POA: Diagnosis not present

## 2016-03-29 ENCOUNTER — Encounter: Payer: Commercial Managed Care - HMO | Admitting: Internal Medicine

## 2016-03-29 DIAGNOSIS — L89153 Pressure ulcer of sacral region, stage 3: Secondary | ICD-10-CM | POA: Diagnosis not present

## 2016-03-29 DIAGNOSIS — M069 Rheumatoid arthritis, unspecified: Secondary | ICD-10-CM | POA: Diagnosis not present

## 2016-03-29 DIAGNOSIS — G8221 Paraplegia, complete: Secondary | ICD-10-CM | POA: Diagnosis not present

## 2016-03-29 DIAGNOSIS — I10 Essential (primary) hypertension: Secondary | ICD-10-CM | POA: Diagnosis not present

## 2016-03-30 DIAGNOSIS — Z87828 Personal history of other (healed) physical injury and trauma: Secondary | ICD-10-CM | POA: Diagnosis not present

## 2016-03-30 DIAGNOSIS — Z4800 Encounter for change or removal of nonsurgical wound dressing: Secondary | ICD-10-CM | POA: Diagnosis not present

## 2016-03-30 DIAGNOSIS — I10 Essential (primary) hypertension: Secondary | ICD-10-CM | POA: Diagnosis not present

## 2016-03-30 DIAGNOSIS — G8221 Paraplegia, complete: Secondary | ICD-10-CM | POA: Diagnosis not present

## 2016-03-30 DIAGNOSIS — M069 Rheumatoid arthritis, unspecified: Secondary | ICD-10-CM | POA: Diagnosis not present

## 2016-03-30 DIAGNOSIS — L89153 Pressure ulcer of sacral region, stage 3: Secondary | ICD-10-CM | POA: Diagnosis not present

## 2016-03-30 NOTE — Progress Notes (Signed)
Bradley [name], Bradley [name] ([id number]) Visit Report for 03/29/2016 Arrival Information Details Patient Name: Bradley [name], Bradley [name]. Date of Service: 03/29/2016 10:00 AM Medical Record Number: [id number] Patient Account Number: [id number] Date of Birth/Sex: 30-May-1946 (70 y.o. Male) Treating RN: Montey Hora Primary Care Physician: [name] Other Clinician: Referring Physician: [name] Treating Physician/Extender: Tito Dine in Treatment: 4 Visit Information History Since Last Visit Added or deleted any medications: No Patient Arrived: Wheel Chair Any new allergies or adverse reactions: No Arrival Time: 10:09 Had a fall or experienced change in No activities of daily living that may affect Accompanied By: self risk of falls: Transfer Assistance: Hoyer Lift Signs or symptoms of abuse/neglect since last No Patient Identification Verified: Yes visito Secondary Verification Process Yes Hospitalized since last visit: No Completed: Pain Present Now: No Electronic Signature(s) Signed: 03/29/2016 4:33:42 PM By: Montey Hora Entered By: Montey Hora on 03/29/2016 10:09:26 Bradley [name] ([id number]) -------------------------------------------------------------------------------- Clinic Level of Care Assessment Details Patient Name: Bradley Loa A. Date of Service: 03/29/2016 10:00 AM Medical Record Number: [id number] Patient Account Number: [id number] Date of Birth/Sex: 30-Nov-1945 (70 y.o. Male) Treating RN: Montey Hora Primary Care Physician: [name] Other Clinician: Referring Physician: [name] Treating Physician/Extender: Tito Dine in Treatment: 4 Clinic Level of Care Assessment Items TOOL 4 Quantity Score []  - Use when only an EandM is performed on FOLLOW-UP visit 0 ASSESSMENTS - Nursing Assessment / Reassessment X - Reassessment of Co-morbidities (includes updates in patient status) 1 10 X - Reassessment of  Adherence to Treatment Plan 1 5 ASSESSMENTS - Wound and Skin Assessment / Reassessment X - Simple Wound Assessment / Reassessment - one wound 1 5 []  - Complex Wound Assessment / Reassessment - multiple wounds 0 []  - Dermatologic / Skin Assessment (not related to wound area) 0 ASSESSMENTS - Focused Assessment []  - Circumferential Edema Measurements - multi extremities 0 []  - Nutritional Assessment / Counseling / Intervention 0 []  - Lower Extremity Assessment (monofilament, tuning fork, pulses) 0 []  - Peripheral Arterial Disease Assessment (using hand held doppler) 0 ASSESSMENTS - Ostomy and/or Continence Assessment and Care []  - Incontinence Assessment and Management 0 []  - Ostomy Care Assessment and Management (repouching, etc.) 0 PROCESS - Coordination of Care X - Simple Patient / Family Education for ongoing care 1 15 []  - Complex (extensive) Patient / Family Education for ongoing care 0 []  - Staff obtains Programmer, systems, Records, Test Results / Process Orders 0 []  - Staff telephones HHA, Nursing Homes / Clarify orders / etc 0 []  - Routine Transfer to another Facility (non-emergent condition) 0 Bradley [name], Bradley [name] ([id number]) []  - Routine Hospital Admission (non-emergent condition) 0 []  - New Admissions / Biomedical engineer / Ordering NPWT, Apligraf, etc. 0 []  - Emergency Hospital Admission (emergent condition) 0 X - Simple Discharge Coordination 1 10 []  - Complex (extensive) Discharge Coordination 0 PROCESS - Special Needs []  - Pediatric / Minor Patient Management 0 []  - Isolation Patient Management 0 []  - Hearing / Language / Visual special needs 0 []  - Assessment of Community assistance (transportation, D/C planning, etc.) 0 []  - Additional assistance / Altered mentation 0 []  - Support Surface(s) Assessment (bed, cushion, seat, etc.) 0 INTERVENTIONS - Wound Cleansing / Measurement X - Simple Wound Cleansing - one wound 1 5 []  - Complex Wound Cleansing - multiple wounds 0 X -  Wound Imaging (photographs - any number of wounds) 1 5 []  - Wound Tracing (instead of photographs) 0 X - Simple Wound Measurement - one  wound 1 5 []  - Complex Wound Measurement - multiple wounds 0 INTERVENTIONS - Wound Dressings []  - Small Wound Dressing one or multiple wounds 0 X - Medium Wound Dressing one or multiple wounds 1 15 []  - Large Wound Dressing one or multiple wounds 0 []  - Application of Medications - topical 0 []  - Application of Medications - injection 0 INTERVENTIONS - Miscellaneous []  - External ear exam 0 Bradley [name], Bradley A. ([id number]) []  - Specimen Collection (cultures, biopsies, blood, body fluids, etc.) 0 []  - Specimen(s) / Culture(s) sent or taken to Lab for analysis 0 []  - Patient Transfer (multiple staff / Harrel Lemon Lift / Similar devices) 0 []  - Simple Staple / Suture removal (25 or less) 0 []  - Complex Staple / Suture removal (26 or more) 0 []  - Hypo / Hyperglycemic Management (close monitor of Blood Glucose) 0 []  - Ankle / Brachial Index (ABI) - do not check if billed separately 0 X - Vital Signs 1 5 Has the patient been seen at the hospital within the last three years: Yes Total Score: 80 Level Of Care: New/Established - Level 3 Electronic Signature(s) Signed: 03/29/2016 4:33:42 PM By: Montey Hora Entered By: Montey Hora on 03/29/2016 11:56:13 Bradley [name] ([id number]) -------------------------------------------------------------------------------- Encounter Discharge Information Details Patient Name: Bradley Loa A. Date of Service: 03/29/2016 10:00 AM Medical Record Number: [id number] Patient Account Number: [id number] Date of Birth/Sex: Sep 02, 1945 (70 y.o. Male) Treating RN: Montey Hora Primary Care Physician: [name] Other Clinician: Referring Physician: [name] Treating Physician/Extender: Tito Dine in Treatment: 4 Encounter Discharge Information Items Discharge Pain Level: 0 Discharge Condition:  Stable Ambulatory Status: Wheelchair Discharge Destination: Home Transportation: Private Auto Accompanied By: self Schedule Follow-up Appointment: Yes Medication Reconciliation completed and provided to Patient/Care No Samanthamarie Ezzell: Provided on Clinical Summary of Care: 03/29/2016 Form Type Recipient Paper Patient JG Electronic Signature(s) Signed: 03/29/2016 11:57:18 AM By: Montey Hora Previous Signature: 03/29/2016 11:14:44 AM Version By: Ruthine Dose Entered By: Montey Hora on 03/29/2016 11:57:17 Bradley [name] ([id number]) -------------------------------------------------------------------------------- Multi Wound Chart Details Patient Name: Bradley Loa A. Date of Service: 03/29/2016 10:00 AM Medical Record Number: [id number] Patient Account Number: [id number] Date of Birth/Sex: 10-21-1945 (70 y.o. Male) Treating RN: Montey Hora Primary Care Physician: [name] Other Clinician: Referring Physician: [name] Treating Physician/Extender: Tito Dine in Treatment: 4 Vital Signs Height(in): Pulse(bpm): 96 Weight(lbs): Blood Pressure 113/58 (mmHg): Body Mass Index(BMI): Temperature(F): 98.3 Respiratory Rate 16 (breaths/min): Photos: [5:No Photos] [N/A:N/A] Wound Location: [5:Sacrum] [N/A:N/A] Wounding Event: [5:Pressure Injury] [N/A:N/A] Primary Etiology: [5:Pressure Ulcer] [N/A:N/A] Comorbid History: [5:Hypertension, Rheumatoid Arthritis, Paraplegia] [N/A:N/A] Date Acquired: [5:11/05/2014] [N/A:N/A] Weeks of Treatment: [5:4] [N/A:N/A] Wound Status: [5:Open] [N/A:N/A] Measurements L x W x D 12.9x11x3.2 [N/A:N/A] (cm) Area (cm) : [5:111.448] [N/A:N/A] Volume (cm) : [5:356.634] [N/A:N/A] % Reduction in Area: [5:-62.20%] [N/A:N/A] % Reduction in Volume: -116.20% [N/A:N/A] Classification: [5:Category/Stage III] [N/A:N/A] Exudate Amount: [5:Large] [N/A:N/A] Exudate Type: [5:Serous] [N/A:N/A] Exudate Color: [5:amber]  [N/A:N/A] Wound Margin: [5:Thickened] [N/A:N/A] Granulation Amount: [5:Medium (34-66%)] [N/A:N/A] Granulation Quality: [5:Red] [N/A:N/A] Necrotic Amount: [5:Medium (34-66%)] [N/A:N/A] Exposed Structures: [5:Fat: Yes Fascia: No Tendon: No Muscle: No Joint: No Bone: No] [N/A:N/A] Epithelialization: None N/A N/A Periwound Skin Texture: Edema: No N/A N/A Excoriation: No Induration: No Callus: No Crepitus: No Fluctuance: No Friable: No Rash: No Scarring: No Periwound Skin Maceration: Yes N/A N/A Moisture: Moist: Yes Dry/Scaly: No Periwound Skin Color: Atrophie Blanche: No N/A N/A Cyanosis: No Ecchymosis: No Erythema: No Hemosiderin Staining: No Mottled: No Pallor: No Rubor: No  Temperature: No Abnormality N/A N/A Tenderness on No N/A N/A Palpation: Wound Preparation: Ulcer Cleansing: N/A N/A Rinsed/Irrigated with Saline Topical Anesthetic Applied: Other: lidocaine 4% Treatment Notes Electronic Signature(s) Signed: 03/29/2016 4:33:42 PM By: Montey Hora Entered By: Montey Hora on 03/29/2016 10:47:44 Bradley [name] (Bradley [name]) -------------------------------------------------------------------------------- [place] Details Patient Name: Bradley [name], Bradley [name] A. Date of Service: 03/29/2016 10:00 AM Medical Record Number: Bradley [name] Patient Account Number: [id number] Date of Birth/Sex: [birth year] (70 y.o. Male) Treating RN: Montey Hora Primary Care Physician: [name] Other Clinician: Referring Physician: [name] Treating Physician/Extender: Tito Dine in Treatment: 4 Active Inactive Abuse / Safety / Falls / Self Care Management Nursing Diagnoses: Impaired physical mobility Potential for falls Goals: Patient will remain injury free Date Initiated: 03/01/2016 Goal Status: Active Interventions: Assess fall risk on admission and as needed Notes: Pressure Nursing Diagnoses: Knowledge deficit related to causes  and risk factors for pressure ulcer development Goals: Patient will remain free from development of additional pressure ulcers Date Initiated: 03/01/2016 Goal Status: Active Interventions: Provide education on pressure ulcers Notes: Wound/Skin Impairment Nursing Diagnoses: Impaired tissue integrity Goals: Patient/caregiver will verbalize understanding of skin care regimen Bradley [name], Bradley [name] (Bradley [name]) Date Initiated: 03/01/2016 Goal Status: Active Ulcer/skin breakdown will have a volume reduction of 30% by week 4 Date Initiated: 03/01/2016 Goal Status: Active Ulcer/skin breakdown will have a volume reduction of 50% by week 8 Date Initiated: 03/01/2016 Goal Status: Active Ulcer/skin breakdown will have a volume reduction of 80% by week 12 Date Initiated: 03/01/2016 Goal Status: Active Ulcer/skin breakdown will heal within 14 weeks Date Initiated: 03/01/2016 Goal Status: Active Interventions: Assess patient/caregiver ability to obtain necessary supplies Assess patient/caregiver ability to perform ulcer/skin care regimen upon admission and as needed Assess ulceration(s) every visit Notes: Electronic Signature(s) Signed: 03/29/2016 4:33:42 PM By: Montey Hora Entered By: Montey Hora on 03/29/2016 10:47:33 Bradley [name] (Bradley [name]) -------------------------------------------------------------------------------- Pain Assessment Details Patient Name: Bradley Loa A. Date of Service: 03/29/2016 10:00 AM Medical Record Number: Bradley [name] Patient Account Number: [id number] Date of Birth/Sex: 1945-08-22 (70 y.o. Male) Treating RN: Montey Hora Primary Care Physician: [name] Other Clinician: Referring Physician: [name] Treating Physician/Extender: Tito Dine in Treatment: 4 Active Problems Location of Pain Severity and Description of Pain Patient Has Paino No Site Locations Pain Management and Medication Current Pain  Management: Notes Topical or injectable lidocaine is offered to patient for acute pain when surgical debridement is performed. If needed, Patient is instructed to use over the counter pain medication for the following 24-48 hours after debridement. Wound care MDs do not prescribed pain medications. Patient has chronic pain or uncontrolled pain. Patient has been instructed to make an appointment with their Primary Care Physician for pain management. Electronic Signature(s) Signed: 03/29/2016 4:33:42 PM By: Montey Hora Entered By: Montey Hora on 03/29/2016 10:09:35 Bradley [name] (Bradley [name]) -------------------------------------------------------------------------------- Patient/Caregiver Education Details Patient Name: Bradley [name] Date of Service: 03/29/2016 10:00 AM Medical Record Number: Bradley [name] Patient Account Number: [id number] Date of Birth/Gender: [birth year] (70 y.o. Male) Treating RN: Montey Hora Primary Care Physician: [name] Other Clinician: Referring Physician: [name] Treating Physician/Extender: Tito Dine in Treatment: 4 Education Assessment Education Provided To: Patient Education Topics Provided Nutrition: Handouts: Nutrition Methods: Explain/Verbal Responses: State content correctly Electronic Signature(s) Signed: 03/29/2016 4:33:42 PM By: Montey Hora Entered By: Montey Hora on 03/29/2016 11:57:33 Bradley [name] (Bradley [name]) -------------------------------------------------------------------------------- Wound Assessment Details Patient Name: Bradley Loa A. Date of Service: 03/29/2016 10:00 AM Medical Record Number: Bradley [name] Patient  Account Number: [id number] Date of Birth/Sex: 10-30-1945 (70 y.o. Male) Treating RN: Montey Hora Primary Care Physician: [name] Other Clinician: Referring Physician: [name] Treating Physician/Extender: Ricard Dillon Weeks in Treatment:  4 Wound Status Wound Number: 5 Primary Pressure Ulcer Etiology: Wound Location: Sacrum Wound Status: Open Wounding Event: Pressure Injury Comorbid Hypertension, Rheumatoid Arthritis, Date Acquired: 11/05/2014 History: Paraplegia Weeks Of Treatment: 4 Clustered Wound: No Photos Wound Measurements Length: (cm) 12.9 Width: (cm) 11 Depth: (cm) 3.2 Area: (cm) 111.448 Volume: (cm) 356.634 % Reduction in Area: -62.2% % Reduction in Volume: -116.2% Epithelialization: None Tunneling: No Undermining: No Wound Description Classification: Category/Stage III Wound Margin: Thickened Exudate Amount: Large Exudate Type: Serous Exudate Color: amber Foul Odor After Cleansing: No Wound Bed Granulation Amount: Medium (34-66%) Exposed Structure Granulation Quality: Red Fascia Exposed: No Necrotic Amount: Medium (34-66%) Fat Layer Exposed: Yes Necrotic Quality: Adherent Slough Tendon Exposed: No Muscle Exposed: No Bradley [name], Bradley A. (Bradley [name]) Joint Exposed: No Bone Exposed: No Periwound Skin Texture Texture Color No Abnormalities Noted: No No Abnormalities Noted: No Callus: No Atrophie Blanche: No Crepitus: No Cyanosis: No Excoriation: No Ecchymosis: No Fluctuance: No Erythema: No Friable: No Hemosiderin Staining: No Induration: No Mottled: No Localized Edema: No Pallor: No Rash: No Rubor: No Scarring: No Temperature / Pain Moisture Temperature: No Abnormality No Abnormalities Noted: No Dry / Scaly: No Maceration: Yes Moist: Yes Wound Preparation Ulcer Cleansing: Rinsed/Irrigated with Saline Topical Anesthetic Applied: Other: lidocaine 4%, Treatment Notes Wound #5 (Sacrum) 1. Cleansed with: Clean wound with Normal Saline 4. Dressing Applied: Aquacel Ag Other dressing (specify in notes) 5. Secondary Dressing Applied ABD Pad 7. Secured with Tape Notes Manufacturing systems engineer) Signed: 03/29/2016 4:33:42 PM By: Montey Hora Entered By: Montey Hora on 03/29/2016 11:36:43 Bradley [name] (Bradley [name]) -------------------------------------------------------------------------------- Vitals Details Patient Name: Bradley Loa A. Date of Service: 03/29/2016 10:00 AM Medical Record Number: Bradley [name] Patient Account Number: [id number] Date of Birth/Sex: [birth year] (70 y.o. Male) Treating RN: Montey Hora Primary Care Physician: [name] Other Clinician: Referring Physician: [name] Treating Physician/Extender: Ricard Dillon Weeks in Treatment: 4 Vital Signs Time Taken: 10:09 Temperature (F): 98.3 Pulse (bpm): 96 Respiratory Rate (breaths/min): 16 Blood Pressure (mmHg): 113/58 Reference Range: 80 - 120 mg / dl Electronic Signature(s) Signed: 03/29/2016 4:33:42 PM By: Montey Hora Entered By: Montey Hora on 03/29/2016 10:11:06

## 2016-03-30 NOTE — Progress Notes (Signed)
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 Treatment: 4 Diagnosis Coding ICD-10 Codes Code Description L89.153 Pressure ulcer of sacral region, stage 3 G82.21 Paraplegia, complete Facility Procedures CPT4 Code: [id number] Description: 99213 - WOUND CARE VISIT-LEV 3 EST PT Modifier: Quantity: 1 Physician Procedures CPT4 Code: [id number] Description: [id number] - WC PHYS LEVEL 3 - EST PT ICD-10 Description Diagnosis L89.153 Pressure ulcer of sacral region, stage 3 Modifier: Quantity: 1 Electronic Signature(s) Signed: 03/29/2016 11:56:23 AM By: Montey Hora Signed: 03/29/2016 5:12:34 PM By: Linton Ham MD Entered By: Montey Hora on 03/29/2016 11:56:22

## 2016-04-01 DIAGNOSIS — M069 Rheumatoid arthritis, unspecified: Secondary | ICD-10-CM | POA: Diagnosis not present

## 2016-04-01 DIAGNOSIS — Z4800 Encounter for change or removal of nonsurgical wound dressing: Secondary | ICD-10-CM | POA: Diagnosis not present

## 2016-04-01 DIAGNOSIS — I10 Essential (primary) hypertension: Secondary | ICD-10-CM | POA: Diagnosis not present

## 2016-04-01 DIAGNOSIS — L89153 Pressure ulcer of sacral region, stage 3: Secondary | ICD-10-CM | POA: Diagnosis not present

## 2016-04-01 DIAGNOSIS — G8221 Paraplegia, complete: Secondary | ICD-10-CM | POA: Diagnosis not present

## 2016-04-01 DIAGNOSIS — Z87828 Personal history of other (healed) physical injury and trauma: Secondary | ICD-10-CM | POA: Diagnosis not present

## 2016-04-06 DIAGNOSIS — M069 Rheumatoid arthritis, unspecified: Secondary | ICD-10-CM | POA: Diagnosis not present

## 2016-04-06 DIAGNOSIS — L89153 Pressure ulcer of sacral region, stage 3: Secondary | ICD-10-CM | POA: Diagnosis not present

## 2016-04-06 DIAGNOSIS — G8221 Paraplegia, complete: Secondary | ICD-10-CM | POA: Diagnosis not present

## 2016-04-06 DIAGNOSIS — I10 Essential (primary) hypertension: Secondary | ICD-10-CM | POA: Diagnosis not present

## 2016-04-06 DIAGNOSIS — Z87828 Personal history of other (healed) physical injury and trauma: Secondary | ICD-10-CM | POA: Diagnosis not present

## 2016-04-06 DIAGNOSIS — Z4800 Encounter for change or removal of nonsurgical wound dressing: Secondary | ICD-10-CM | POA: Diagnosis not present

## 2016-04-08 DIAGNOSIS — Z4800 Encounter for change or removal of nonsurgical wound dressing: Secondary | ICD-10-CM | POA: Diagnosis not present

## 2016-04-08 DIAGNOSIS — G8221 Paraplegia, complete: Secondary | ICD-10-CM | POA: Diagnosis not present

## 2016-04-08 DIAGNOSIS — M069 Rheumatoid arthritis, unspecified: Secondary | ICD-10-CM | POA: Diagnosis not present

## 2016-04-08 DIAGNOSIS — L89153 Pressure ulcer of sacral region, stage 3: Secondary | ICD-10-CM | POA: Diagnosis not present

## 2016-04-08 DIAGNOSIS — Z87828 Personal history of other (healed) physical injury and trauma: Secondary | ICD-10-CM | POA: Diagnosis not present

## 2016-04-08 DIAGNOSIS — I10 Essential (primary) hypertension: Secondary | ICD-10-CM | POA: Diagnosis not present

## 2016-04-12 ENCOUNTER — Encounter: Payer: Commercial Managed Care - HMO | Attending: Physician Assistant | Admitting: Physician Assistant

## 2016-04-12 DIAGNOSIS — L89153 Pressure ulcer of sacral region, stage 3: Secondary | ICD-10-CM | POA: Diagnosis not present

## 2016-04-12 DIAGNOSIS — G8221 Paraplegia, complete: Secondary | ICD-10-CM | POA: Insufficient documentation

## 2016-04-12 DIAGNOSIS — I10 Essential (primary) hypertension: Secondary | ICD-10-CM | POA: Insufficient documentation

## 2016-04-12 DIAGNOSIS — M069 Rheumatoid arthritis, unspecified: Secondary | ICD-10-CM | POA: Diagnosis not present

## 2016-04-12 DIAGNOSIS — G8220 Paraplegia, unspecified: Secondary | ICD-10-CM | POA: Diagnosis not present

## 2016-04-13 DIAGNOSIS — I10 Essential (primary) hypertension: Secondary | ICD-10-CM | POA: Diagnosis not present

## 2016-04-13 DIAGNOSIS — G8221 Paraplegia, complete: Secondary | ICD-10-CM | POA: Diagnosis not present

## 2016-04-13 DIAGNOSIS — Z4800 Encounter for change or removal of nonsurgical wound dressing: Secondary | ICD-10-CM | POA: Diagnosis not present

## 2016-04-13 DIAGNOSIS — M069 Rheumatoid arthritis, unspecified: Secondary | ICD-10-CM | POA: Diagnosis not present

## 2016-04-13 DIAGNOSIS — Z87828 Personal history of other (healed) physical injury and trauma: Secondary | ICD-10-CM | POA: Diagnosis not present

## 2016-04-13 DIAGNOSIS — L89153 Pressure ulcer of sacral region, stage 3: Secondary | ICD-10-CM | POA: Diagnosis not present

## 2016-04-13 NOTE — Progress Notes (Addendum)
[name], STILLSON ([id number]) Visit Report for 04/12/2016 Arrival Information Details Patient Name: Bradley [name], Bradley [name]. Date of Service: 04/12/2016 10:00 AM Medical Record Number: [id number] Patient Account Number: [id number] Date of Birth/Sex: 1945/12/16 (70 y.o. Male) Treating RN: Montey Hora Primary Care Physician: [name] Other Clinician: Referring Physician: [name] Treating Physician/Extender: [name], HOYT Weeks in Treatment: 6 Visit Information History Since Last Visit Added or deleted any medications: No Patient Arrived: Wheel Chair Any new allergies or adverse reactions: No Arrival Time: 10:25 Had a fall or experienced change in No activities of daily living that may affect Accompanied By: self risk of falls: Transfer Assistance: Hoyer Lift Signs or symptoms of abuse/neglect since last No Patient Identification Verified: Yes visito Secondary Verification Process Yes Hospitalized since last visit: No Completed: Pain Present Now: No Electronic Signature(s) Signed: 04/12/2016 5:44:00 PM By: Montey Hora Entered By: Montey Hora on 04/12/2016 10:25:42 Bradley [name] ([id number]) -------------------------------------------------------------------------------- Encounter Discharge Information Details Patient Name: Bradley Loa A. Date of Service: 04/12/2016 10:00 AM Medical Record Number: [id number] Patient Account Number: [id number] Date of Birth/Sex: Apr 05, 1946 (70 y.o. Male) Treating RN: Montey Hora Primary Care Physician: [name] Other Clinician: Referring Physician: [name] Treating Physician/Extender: [name], HOYT Weeks in Treatment: 6 Encounter Discharge Information Items Discharge Pain Level: 0 Discharge Condition: Stable Ambulatory Status: Wheelchair Discharge Destination: Home Transportation: Private Auto Accompanied By: self Schedule Follow-up Appointment: Yes Medication Reconciliation completed and  provided to Patient/Care No Daionna Crossland: Provided on Clinical Summary of Care: 04/12/2016 Form Type Recipient Paper Patient JG Electronic Signature(s) Signed: 04/12/2016 11:37:57 AM By: Ruthine Dose Entered By: Ruthine Dose on 04/12/2016 11:37:57 Bradley [name] ([id number]) -------------------------------------------------------------------------------- Multi Wound Chart Details Patient Name: Bradley Loa A. Date of Service: 04/12/2016 10:00 AM Medical Record Number: [id number] Patient Account Number: [id number] Date of Birth/Sex: 1945/10/14 (70 y.o. Male) Treating RN: Montey Hora Primary Care Physician: [name] Other Clinician: Referring Physician: [name] Treating Physician/Extender: [name], HOYT Weeks in Treatment: 6 Vital Signs Height(in): Pulse(bpm): 91 Weight(lbs): Blood Pressure 114/52 (mmHg): Body Mass Index(BMI): Temperature(F): 98.2 Respiratory Rate 16 (breaths/min): Photos: [5:No Photos] [N/A:N/A] Wound Location: [5:Sacrum] [N/A:N/A] Wounding Event: [5:Pressure Injury] [N/A:N/A] Primary Etiology: [5:Pressure Ulcer] [N/A:N/A] Comorbid History: [5:Hypertension, Rheumatoid Arthritis, Paraplegia] [N/A:N/A] Date Acquired: [5:11/05/2014] [N/A:N/A] Weeks of Treatment: [5:6] [N/A:N/A] Wound Status: [5:Open] [N/A:N/A] Measurements L x W x D 13.5x10x2.8 [N/A:N/A] (cm) Area (cm) : [5:106.029] [N/A:N/A] Volume (cm) : [5:296.881] [N/A:N/A] % Reduction in Area: [5:-54.30%] [N/A:N/A] % Reduction in Volume: -80.00% [N/A:N/A] Classification: [5:Category/Stage III] [N/A:N/A] Exudate Amount: [5:Large] [N/A:N/A] Exudate Type: [5:Serous] [N/A:N/A] Exudate Color: [5:amber] [N/A:N/A] Wound Margin: [5:Thickened] [N/A:N/A] Granulation Amount: [5:Large (67-100%)] [N/A:N/A] Granulation Quality: [5:Red] [N/A:N/A] Necrotic Amount: [5:Small (1-33%)] [N/A:N/A] Exposed Structures: [5:Fat: Yes Fascia: No Tendon: No Muscle: No Joint: No Bone: No]  [N/A:N/A] Epithelialization: None N/A N/A Periwound Skin Texture: Edema: No N/A N/A Excoriation: No Induration: No Callus: No Crepitus: No Fluctuance: No Friable: No Rash: No Scarring: No Periwound Skin Maceration: Yes N/A N/A Moisture: Moist: Yes Dry/Scaly: No Periwound Skin Color: Atrophie Blanche: No N/A N/A Cyanosis: No Ecchymosis: No Erythema: No Hemosiderin Staining: No Mottled: No Pallor: No Rubor: No Temperature: No Abnormality N/A N/A Tenderness on No N/A N/A Palpation: Wound Preparation: Ulcer Cleansing: N/A N/A Rinsed/Irrigated with Saline Topical Anesthetic Applied: Other: lidocaine 4% Treatment Notes Electronic Signature(s) Signed: 04/12/2016 10:33:56 AM By: Montey Hora Entered By: Montey Hora on 04/12/2016 10:33:56 Bradley [name] ([id number]) -------------------------------------------------------------------------------- [place] Details Patient Name: Bradley [name], Bradley A. Date of Service: 04/12/2016  10:00 AM Medical Record Number: [id number] Patient Account Number: [id number] Date of Birth/Sex: [birth year] (70 y.o. Male) Treating RN: Montey Hora Primary Care Physician: [name] Other Clinician: Referring Physician: [name] Treating Physician/Extender: [name], HOYT Weeks in Treatment: 6 Active Inactive Abuse / Safety / Falls / Self Care Management Nursing Diagnoses: Impaired physical mobility Potential for falls Goals: Patient will remain injury free Date Initiated: 03/01/2016 Goal Status: Active Interventions: Assess fall risk on admission and as needed Notes: Pressure Nursing Diagnoses: Knowledge deficit related to causes and risk factors for pressure ulcer development Goals: Patient will remain free from development of additional pressure ulcers Date Initiated: 03/01/2016 Goal Status: Active Interventions: Provide education on pressure ulcers Notes: Wound/Skin Impairment Nursing  Diagnoses: Impaired tissue integrity Goals: Patient/caregiver will verbalize understanding of skin care regimen Bradley [name], Bradley [name] ([id number]) Date Initiated: 03/01/2016 Goal Status: Active Ulcer/skin breakdown will have a volume reduction of 30% by week 4 Date Initiated: 03/01/2016 Goal Status: Active Ulcer/skin breakdown will have a volume reduction of 50% by week 8 Date Initiated: 03/01/2016 Goal Status: Active Ulcer/skin breakdown will have a volume reduction of 80% by week 12 Date Initiated: 03/01/2016 Goal Status: Active Ulcer/skin breakdown will heal within 14 weeks Date Initiated: 03/01/2016 Goal Status: Active Interventions: Assess patient/caregiver ability to obtain necessary supplies Assess patient/caregiver ability to perform ulcer/skin care regimen upon admission and as needed Assess ulceration(s) every visit Notes: Electronic Signature(s) Signed: 04/12/2016 10:33:42 AM By: Montey Hora Entered By: Montey Hora on 04/12/2016 10:33:39 Bradley [name] ([id number]) -------------------------------------------------------------------------------- Pain Assessment Details Patient Name: Bradley Loa A. Date of Service: 04/12/2016 10:00 AM Medical Record Number: [id number] Patient Account Number: [id number] Date of Birth/Sex: [birth year] (70 y.o. Male) Treating RN: Montey Hora Primary Care Physician: [name] Other Clinician: Referring Physician: [name] Treating Physician/Extender: [name], HOYT Weeks in Treatment: 6 Active Problems Location of Pain Severity and Description of Pain Patient Has Paino No Site Locations Pain Management and Medication Current Pain Management: Notes Topical or injectable lidocaine is offered to patient for acute pain when surgical debridement is performed. If needed, Patient is instructed to use over the counter pain medication for the following 24-48 hours after debridement. Wound care MDs do not prescribed pain  medications. Patient has chronic pain or uncontrolled pain. Patient has been instructed to make an appointment with their Primary Care Physician for pain management. Electronic Signature(s) Signed: 04/12/2016 5:44:00 PM By: Montey Hora Entered By: Montey Hora on 04/12/2016 10:26:56 Bradley [name] ([id number]) -------------------------------------------------------------------------------- Patient/Caregiver Education Details Patient Name: Bradley [name] Date of Service: 04/12/2016 10:00 AM Medical Record Number: [id number] Patient Account Number: [id number] Date of Birth/Gender: Jun 11, 1945 (70 y.o. Male) Treating RN: Montey Hora Primary Care Physician: [name] Other Clinician: Referring Physician: [name] Treating Physician/Extender: Sharalyn Ink in Treatment: 6 Education Assessment Education Provided To: Patient Education Topics Provided Wound/Skin Impairment: Handouts: Other: wund care to continue as ordered Methods: Explain/Verbal Responses: State content correctly Electronic Signature(s) Signed: 04/12/2016 5:44:00 PM By: Montey Hora Entered By: Montey Hora on 04/12/2016 11:19:29 Bradley [name] ([id number]) -------------------------------------------------------------------------------- Wound Assessment Details Patient Name: Bradley Loa A. Date of Service: 04/12/2016 10:00 AM Medical Record Number: [id number] Patient Account Number: [id number] Date of Birth/Sex: 11-Jun-1945 (70 y.o. Male) Treating RN: Montey Hora Primary Care Physician: [name] Other Clinician: Referring Physician: [name] Treating Physician/Extender: [name], HOYT Weeks in Treatment: 6 Wound Status Wound Number: 5 Primary Pressure Ulcer Etiology: Wound Location: Sacrum Wound Status: Open Wounding Event: Pressure Injury Comorbid Hypertension,  Rheumatoid Arthritis, Date Acquired: 11/05/2014 History: Paraplegia Weeks Of Treatment:  6 Clustered Wound: No Photos Photo Uploaded By: Montey Hora on 04/12/2016 15:37:46 Wound Measurements Length: (cm) 13.5 Width: (cm) 10 Depth: (cm) 2.8 Area: (cm) 106.029 Volume: (cm) 296.881 % Reduction in Area: -54.3% % Reduction in Volume: -80% Epithelialization: None Tunneling: No Undermining: No Wound Description Classification: Category/Stage III Wound Margin: Thickened Exudate Amount: Large Exudate Type: Serous Exudate Color: amber Foul Odor After Cleansing: No Wound Bed Granulation Amount: Large (67-100%) Exposed Structure Granulation Quality: Red Fascia Exposed: No Necrotic Amount: Small (1-33%) Fat Layer Exposed: Yes Necrotic Quality: Adherent Slough Tendon Exposed: No Bradley [name], Bradley A. ([id number]) Muscle Exposed: No Joint Exposed: No Bone Exposed: No Periwound Skin Texture Texture Color No Abnormalities Noted: No No Abnormalities Noted: No Callus: No Atrophie Blanche: No Crepitus: No Cyanosis: No Excoriation: No Ecchymosis: No Fluctuance: No Erythema: No Friable: No Hemosiderin Staining: No Induration: No Mottled: No Localized Edema: No Pallor: No Rash: No Rubor: No Scarring: No Temperature / Pain Moisture Temperature: No Abnormality No Abnormalities Noted: No Dry / Scaly: No Maceration: Yes Moist: Yes Wound Preparation Ulcer Cleansing: Rinsed/Irrigated with Saline Topical Anesthetic Applied: Other: lidocaine 4%, Treatment Notes Wound #5 (Sacrum) 1. Cleansed with: Clean wound with Normal Saline 2. Anesthetic Topical Lidocaine 4% cream to wound bed prior to debridement 4. Dressing Applied: Aquacel Ag Other dressing (specify in notes) 5. Secondary Dressing Applied ABD Pad 7. Secured with Paper tape Notes Manufacturing systems engineer) Signed: 04/12/2016 5:44:00 PM By: Montey Hora Entered By: Montey Hora on 04/12/2016 10:33:26 Bradley [name], Bradley [name] ([id number]) Bradley [name], Bradley [name]  ([id number]) -------------------------------------------------------------------------------- [place] Details Patient Name: Bradley Loa A. Date of Service: 04/12/2016 10:00 AM Medical Record Number: [id number] Patient Account Number: [id number] Date of Birth/Sex: 12/31/1945 (70 y.o. Male) Treating RN: Montey Hora Primary Care Physician: [name] Other Clinician: Referring Physician: [name] Treating Physician/Extender: [name], HOYT Weeks in Treatment: 6 Vital Signs Time Taken: 10:26 Temperature (F): 98.2 Pulse (bpm): 91 Respiratory Rate (breaths/min): 16 Blood Pressure (mmHg): 114/52 Reference Range: 80 - 120 mg / dl Electronic Signature(s) Signed: 04/12/2016 5:44:00 PM By: Montey Hora Entered By: Montey Hora on 04/12/2016 10:26:42

## 2016-04-14 NOTE — Progress Notes (Signed)
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 requirements with this patient on this date. The encounter with the patient was in whole or in part for the following MEDICAL CONDITION: (primary reason for [place]) MEDICAL NECESSITY: I certify, that based on my findings, NURSING services are a medically necessary home health service. HOME BOUND STATUS: I certify that my clinical findings support that this patient is homebound (i.e., Due to illness or injury, pt requires aid of supportive devices such as crutches, cane, wheelchairs, walkers, the use Bradley [name], Bradley A. ([id number]) of special transportation or the assistance of another person to leave their place of residence. There is a normal inability to leave the home and doing so requires considerable and taxing effort. Other absences are for medical reasons / religious services and are infrequent or of short duration when for other reasons). If current dressing causes regression in  wound condition, may D/C ordered dressing product/s and apply Normal Saline Moist Dressing daily until next [place] / Other MD appointment. [place] of regression in wound condition at [phone]. Please direct any NON-WOUND related issues/requests for orders to patient's Primary Care Physician Follow-Up Appointments: A follow-up appointment should be scheduled. A Patient Clinical Summary of Care was provided to [facility] On evaluation today I was able to free up some of the edges that were rolled around the border of this wound currently. Patient tolerated this without complication and though he did have a significant amount of bleeding this was controlled with pressure for the most part I did have to cauterize chemically with silveer nitrate at the 6:00 location. The edges look much better following. We are going to continue with the Aquacel Ag covered with ABDs,Xtrasorb, and dry gauze. I discussed with patient to continue to try and reposition every 2 hours. I also think continuing with his air mattress is appropriate. He also has a Roho cushion for his chair which is appropriate. We will see him back for reevaluation in 2 weekss. Electronic Signature(s) Signed: 04/13/2016 8:57:57 PM By: Worthy Keeler PA-C Entered By: Worthy Keeler on 04/13/2016 16:39:26 Bradley [name] ([id number]) -------------------------------------------------------------------------------- SuperBill Details Patient Name: Bradley [name] Date of Service: 04/12/2016 Medical Record Number: [id number] Patient Account Number: [id number] Date of Birth/Sex: [birth year] (70 y.o. Male) Treating RN: Montey Hora Primary Care Physician: [name] Other Clinician: Referring Physician: [name] Treating Physician/Extender: [name], HOYT Weeks in Treatment: 6 Diagnosis Coding ICD-10 Codes Code Description L89.153 Pressure ulcer of sacral region, stage 3 G82.21 Paraplegia,  complete Facility Procedures CPT4 Code: [id number] Description: 11042 - DEB SUBQ TISSUE 20 SQ CM/< ICD-10 Description Diagnosis L89.153  Pressure ulcer of sacral region, stage 3 Modifier: Quantity: 1 Physician Procedures CPT4 Code: [id number] Description: [id number] - WC PHYS SUBQ TISS 20 SQ CM ICD-10 Description Diagnosis L89.153 Pressure ulcer of sacral region, stage 3 Modifier: Quantity: 1 Electronic Signature(s) Signed: 04/13/2016 12:52:14 AM By: Worthy Keeler PA-C Entered By: Worthy Keeler on 04/13/2016 00:16:36

## 2016-04-15 DIAGNOSIS — L89153 Pressure ulcer of sacral region, stage 3: Secondary | ICD-10-CM | POA: Diagnosis not present

## 2016-04-15 DIAGNOSIS — Z4800 Encounter for change or removal of nonsurgical wound dressing: Secondary | ICD-10-CM | POA: Diagnosis not present

## 2016-04-15 DIAGNOSIS — G8221 Paraplegia, complete: Secondary | ICD-10-CM | POA: Diagnosis not present

## 2016-04-15 DIAGNOSIS — Z87828 Personal history of other (healed) physical injury and trauma: Secondary | ICD-10-CM | POA: Diagnosis not present

## 2016-04-15 DIAGNOSIS — M069 Rheumatoid arthritis, unspecified: Secondary | ICD-10-CM | POA: Diagnosis not present

## 2016-04-15 DIAGNOSIS — I10 Essential (primary) hypertension: Secondary | ICD-10-CM | POA: Diagnosis not present

## 2016-04-20 DIAGNOSIS — Z4800 Encounter for change or removal of nonsurgical wound dressing: Secondary | ICD-10-CM | POA: Diagnosis not present

## 2016-04-20 DIAGNOSIS — L89153 Pressure ulcer of sacral region, stage 3: Secondary | ICD-10-CM | POA: Diagnosis not present

## 2016-04-20 DIAGNOSIS — Z87828 Personal history of other (healed) physical injury and trauma: Secondary | ICD-10-CM | POA: Diagnosis not present

## 2016-04-20 DIAGNOSIS — G8221 Paraplegia, complete: Secondary | ICD-10-CM | POA: Diagnosis not present

## 2016-04-20 DIAGNOSIS — I10 Essential (primary) hypertension: Secondary | ICD-10-CM | POA: Diagnosis not present

## 2016-04-20 DIAGNOSIS — M069 Rheumatoid arthritis, unspecified: Secondary | ICD-10-CM | POA: Diagnosis not present

## 2016-04-22 DIAGNOSIS — I10 Essential (primary) hypertension: Secondary | ICD-10-CM | POA: Diagnosis not present

## 2016-04-22 DIAGNOSIS — L89153 Pressure ulcer of sacral region, stage 3: Secondary | ICD-10-CM | POA: Diagnosis not present

## 2016-04-22 DIAGNOSIS — Z87828 Personal history of other (healed) physical injury and trauma: Secondary | ICD-10-CM | POA: Diagnosis not present

## 2016-04-22 DIAGNOSIS — G8221 Paraplegia, complete: Secondary | ICD-10-CM | POA: Diagnosis not present

## 2016-04-22 DIAGNOSIS — Z4800 Encounter for change or removal of nonsurgical wound dressing: Secondary | ICD-10-CM | POA: Diagnosis not present

## 2016-04-22 DIAGNOSIS — M069 Rheumatoid arthritis, unspecified: Secondary | ICD-10-CM | POA: Diagnosis not present

## 2016-04-26 ENCOUNTER — Encounter: Payer: Commercial Managed Care - HMO | Admitting: Nurse Practitioner

## 2016-04-26 DIAGNOSIS — L89153 Pressure ulcer of sacral region, stage 3: Secondary | ICD-10-CM | POA: Diagnosis not present

## 2016-04-26 DIAGNOSIS — G8221 Paraplegia, complete: Secondary | ICD-10-CM | POA: Diagnosis not present

## 2016-04-26 DIAGNOSIS — I10 Essential (primary) hypertension: Secondary | ICD-10-CM | POA: Diagnosis not present

## 2016-04-26 DIAGNOSIS — M069 Rheumatoid arthritis, unspecified: Secondary | ICD-10-CM | POA: Diagnosis not present

## 2016-04-27 DIAGNOSIS — G8221 Paraplegia, complete: Secondary | ICD-10-CM | POA: Diagnosis not present

## 2016-04-27 DIAGNOSIS — I10 Essential (primary) hypertension: Secondary | ICD-10-CM | POA: Diagnosis not present

## 2016-04-27 DIAGNOSIS — M069 Rheumatoid arthritis, unspecified: Secondary | ICD-10-CM | POA: Diagnosis not present

## 2016-04-27 DIAGNOSIS — Z87828 Personal history of other (healed) physical injury and trauma: Secondary | ICD-10-CM | POA: Diagnosis not present

## 2016-04-27 DIAGNOSIS — Z4800 Encounter for change or removal of nonsurgical wound dressing: Secondary | ICD-10-CM | POA: Diagnosis not present

## 2016-04-27 DIAGNOSIS — L89153 Pressure ulcer of sacral region, stage 3: Secondary | ICD-10-CM | POA: Diagnosis not present

## 2016-04-27 NOTE — Progress Notes (Signed)
Bradley [name], Bradley [name] ([id number]) Visit Report for 04/26/2016 Arrival Information Details Patient Name: Bradley [name], Bradley [name]. Date of Service: 04/26/2016 10:00 AM Medical Record Number: [id number] Patient Account Number: [id number] Date of Birth/Sex: April 06, 1946 (70 y.o. Male) Treating RN: Montey Hora Primary Care Physician: [name] Other Clinician: Referring Physician: [name] Treating Physician/Extender: Tito Dine in Treatment: 8 Visit Information History Since Last Visit Added or deleted any medications: No Patient Arrived: Wheel Chair Any new allergies or adverse reactions: No Arrival Time: 10:19 Had a fall or experienced change in No activities of daily living that may affect Accompanied By: self risk of falls: Transfer Assistance: Hoyer Lift Signs or symptoms of abuse/neglect since last No Patient Identification Verified: Yes visito Secondary Verification Process Yes Hospitalized since last visit: No Completed: Pain Present Now: No Electronic Signature(s) Signed: 04/26/2016 4:46:52 PM By: Montey Hora Entered By: Montey Hora on 04/26/2016 10:22:27 Bradley [name] ([id number]) -------------------------------------------------------------------------------- Encounter Discharge Information Details Patient Name: Bradley Loa A. Date of Service: 04/26/2016 10:00 AM Medical Record Number: [id number] Patient Account Number: [id number] Date of Birth/Sex: 10-Oct-1945 (70 y.o. Male) Treating RN: Montey Hora Primary Care Physician: [name] Other Clinician: Referring Physician: [name] Treating Physician/Extender: Cathie Olden in Treatment: 8 Encounter Discharge Information Items Discharge Pain Level: 0 Discharge Condition: Stable Ambulatory Status: Wheelchair Discharge Destination: Home Transportation: Private Auto Accompanied By: dtr Schedule Follow-up Appointment: Yes Medication Reconciliation  completed and provided to Patient/Care No Riggs Dineen: Provided on Clinical Summary of Care: 04/26/2016 Form Type Recipient Paper Patient JG Electronic Signature(s) Signed: 04/26/2016 3:49:18 PM By: Montey Hora Previous Signature: 04/26/2016 11:21:10 AM Version By: Ruthine Dose Entered By: Montey Hora on 04/26/2016 15:49:17 Bradley [name] ([id number]) -------------------------------------------------------------------------------- Multi Wound Chart Details Patient Name: Bradley Loa A. Date of Service: 04/26/2016 10:00 AM Medical Record Number: [id number] Patient Account Number: [id number] Date of Birth/Sex: [birth year] (70 y.o. Male) Treating RN: Montey Hora Primary Care Physician: [name] Other Clinician: Referring Physician: [name] Treating Physician/Extender: Cathie Olden in Treatment: 8 Vital Signs Height(in): Pulse(bpm): 72 Weight(lbs): Blood Pressure 126/62 (mmHg): Body Mass Index(BMI): Temperature(F): 98.1 Respiratory Rate 16 (breaths/min): Photos: [5:No Photos] [N/A:N/A] Wound Location: [5:Sacrum] [N/A:N/A] Wounding Event: [5:Pressure Injury] [N/A:N/A] Primary Etiology: [5:Pressure Ulcer] [N/A:N/A] Comorbid History: [5:Hypertension, Rheumatoid Arthritis, Paraplegia] [N/A:N/A] Date Acquired: [5:11/05/2014] [N/A:N/A] [name] of Treatment: [5:8] [N/A:N/A] Wound Status: [5:Open] [N/A:N/A] Measurements L x W x D 13.3x9.7x2.9 [N/A:N/A] (cm) Area (cm) : [5:101.324] [N/A:N/A] Volume (cm) : [5:293.84] [N/A:N/A] % Reduction in Area: [5:-47.40%] [N/A:N/A] % Reduction in Volume: -78.20% [N/A:N/A] Classification: [5:Category/Stage III] [N/A:N/A] Exudate Amount: [5:Large] [N/A:N/A] Exudate Type: [5:Serous] [N/A:N/A] Exudate Color: [5:amber] [N/A:N/A] Wound Margin: [5:Thickened] [N/A:N/A] Granulation Amount: [5:Large (67-100%)] [N/A:N/A] Granulation Quality: [5:Red] [N/A:N/A] Necrotic Amount: [5:Small (1-33%)] [N/A:N/A] Exposed  Structures: [5:Fat: Yes Fascia: No Tendon: No Muscle: No Joint: No Bone: No] [N/A:N/A] Epithelialization: None N/A N/A Periwound Skin Texture: Edema: No N/A N/A Excoriation: No Induration: No Callus: No Crepitus: No Fluctuance: No Friable: No Rash: No Scarring: No Periwound Skin Maceration: Yes N/A N/A Moisture: Moist: Yes Dry/Scaly: No Periwound Skin Color: Atrophie Blanche: No N/A N/A Cyanosis: No Ecchymosis: No Erythema: No Hemosiderin Staining: No Mottled: No Pallor: No Rubor: No Temperature: No Abnormality N/A N/A Tenderness on No N/A N/A Palpation: Wound Preparation: Ulcer Cleansing: N/A N/A Rinsed/Irrigated with Saline Topical Anesthetic Applied: Other: lidocaine 4% Treatment Notes Electronic Signature(s) Signed: 04/26/2016 4:46:52 PM By: Montey Hora Entered By: Montey Hora on 04/26/2016 10:51:12 Bradley [name] ([id number]) -------------------------------------------------------------------------------- [place] Details Patient Name:  Bradley [name], Bradley A. Date of Service: 04/26/2016 10:00 AM Medical Record Number: [id number] Patient Account Number: [id number] Date of Birth/Sex: [birth year] (70 y.o. Male) Treating RN: Montey Hora Primary Care Physician: [name] Other Clinician: Referring Physician: [name] Treating Physician/Extender: Tito Dine in Treatment: 8 Active Inactive Abuse / Safety / Falls / Self Care Management Nursing Diagnoses: Impaired physical mobility Potential for falls Goals: Patient will remain injury free Date Initiated: 03/01/2016 Goal Status: Active Interventions: Assess fall risk on admission and as needed Notes: Pressure Nursing Diagnoses: Knowledge deficit related to causes and risk factors for pressure ulcer development Goals: Patient will remain free from development of additional pressure ulcers Date Initiated: 03/01/2016 Goal Status:  Active Interventions: Provide education on pressure ulcers Notes: Wound/Skin Impairment Nursing Diagnoses: Impaired tissue integrity Goals: Patient/caregiver will verbalize understanding of skin care regimen Bradley [name], Bradley [name] ([id number]) Date Initiated: 03/01/2016 Goal Status: Active Ulcer/skin breakdown will have a volume reduction of 30% by week 4 Date Initiated: 03/01/2016 Goal Status: Active Ulcer/skin breakdown will have a volume reduction of 50% by week 8 Date Initiated: 03/01/2016 Goal Status: Active Ulcer/skin breakdown will have a volume reduction of 80% by week 12 Date Initiated: 03/01/2016 Goal Status: Active Ulcer/skin breakdown will heal within 14 [name] Date Initiated: 03/01/2016 Goal Status: Active Interventions: Assess patient/caregiver ability to obtain necessary supplies Assess patient/caregiver ability to perform ulcer/skin care regimen upon admission and as needed Assess ulceration(s) every visit Notes: Electronic Signature(s) Signed: 04/26/2016 4:46:52 PM By: Montey Hora Entered By: Montey Hora on 04/26/2016 10:35:13 Bradley [name] ([id number]) -------------------------------------------------------------------------------- Pain Assessment Details Patient Name: Bradley Loa A. Date of Service: 04/26/2016 10:00 AM Medical Record Number: [id number] Patient Account Number: [id number] Date of Birth/Sex: 1946-01-21 (70 y.o. Male) Treating RN: Montey Hora Primary Care Physician: [name] Other Clinician: Referring Physician: [name] Treating Physician/Extender: Tito Dine in Treatment: 8 Active Problems Location of Pain Severity and Description of Pain Patient Has Paino No Site Locations Pain Management and Medication Current Pain Management: Notes Topical or injectable lidocaine is offered to patient for acute pain when surgical debridement is performed. If needed, Patient is instructed to use over the counter  pain medication for the following 24-48 hours after debridement. Wound care MDs do not prescribed pain medications. Patient has chronic pain or uncontrolled pain. Patient has been instructed to make an appointment with their Primary Care Physician for pain management. Electronic Signature(s) Signed: 04/26/2016 4:46:52 PM By: Montey Hora Entered By: Montey Hora on 04/26/2016 10:23:05 Bradley [name] ([id number]) -------------------------------------------------------------------------------- Patient/Caregiver Education Details Patient Name: Bradley [name] Date of Service: 04/26/2016 10:00 AM Medical Record Number: [id number] Patient Account Number: [id number] Date of Birth/Gender: [birth year] (70 y.o. Male) Treating RN: Montey Hora Primary Care Physician: [name] Other Clinician: Referring Physician: [name] Treating Physician/Extender: Cathie Olden in Treatment: 8 Education Assessment Education Provided To: Patient Education Topics Provided Nutrition: Handouts: Nutrition Methods: Explain/Verbal Responses: State content correctly Electronic Signature(s) Signed: 04/26/2016 4:46:52 PM By: Montey Hora Entered By: Montey Hora on 04/26/2016 15:49:30 Bradley [name] ([id number]) -------------------------------------------------------------------------------- Wound Assessment Details Patient Name: Bradley Loa A. Date of Service: 04/26/2016 10:00 AM Medical Record Number: [id number] Patient Account Number: [id number] Date of Birth/Sex: July 26, 1945 (70 y.o. Male) Treating RN: Montey Hora Primary Care Physician: [name] Other Clinician: Referring Physician: [name] Treating Physician/Extender: Bradley [name] in Treatment: 8 Wound Status Wound Number: 5 Primary Pressure Ulcer Etiology: Wound Location: Sacrum Wound Status: Open Wounding Event: Pressure Injury Comorbid Hypertension, Rheumatoid  Arthritis, Date Acquired: 11/05/2014 History: Paraplegia [name] Of Treatment: 8 Clustered Wound: No Photos Wound Measurements Length: (cm) 13.3 Width: (cm) 9.7 Depth: (cm) 2.9 Area: (cm) 101.324 Volume: (cm) 293.84 % Reduction in Area: -47.4% % Reduction in Volume: -78.2% Epithelialization: None Tunneling: No Undermining: No Wound Description Classification: Category/Stage III Wound Margin: Thickened Exudate Amount: Large Exudate Type: Serous Exudate Color: amber Foul Odor After Cleansing: No Wound Bed Granulation Amount: Large (67-100%) Exposed Structure Granulation Quality: Red Fascia Exposed: No Necrotic Amount: Small (1-33%) Fat Layer Exposed: Yes Necrotic Quality: Adherent Slough Tendon Exposed: No Muscle Exposed: No Bradley [name], Bradley A. ([id number]) Joint Exposed: No Bone Exposed: No Periwound Skin Texture Texture Color No Abnormalities Noted: No No Abnormalities Noted: No Callus: No Atrophie Blanche: No Crepitus: No Cyanosis: No Excoriation: No Ecchymosis: No Fluctuance: No Erythema: No Friable: No Hemosiderin Staining: No Induration: No Mottled: No Localized Edema: No Pallor: No Rash: No Rubor: No Scarring: No Temperature / Pain Moisture Temperature: No Abnormality No Abnormalities Noted: No Dry / Scaly: No Maceration: Yes Moist: Yes Wound Preparation Ulcer Cleansing: Rinsed/Irrigated with Saline Topical Anesthetic Applied: Other: lidocaine 4%, Treatment Notes Wound #5 (Sacrum) 1. Cleansed with: Clean wound with Normal Saline 2. Anesthetic Topical Lidocaine 4% cream to wound bed prior to debridement 4. Dressing Applied: Aquacel Ag Other dressing (specify in notes) 5. Secondary Dressing Applied ABD Pad 7. Secured with Tape Notes Manufacturing systems engineer) Signed: 04/26/2016 4:46:52 PM By: Montey Hora Entered By: Montey Hora on 04/26/2016 15:52:49 Bradley [name]  ([id number]) -------------------------------------------------------------------------------- Vitals Details Patient Name: Bradley Loa A. Date of Service: 04/26/2016 10:00 AM Medical Record Number: [id number] Patient Account Number: [id number] Date of Birth/Sex: [birth year] (70 y.o. Male) Treating RN: Montey Hora Primary Care Physician: [name] Other Clinician: Referring Physician: [name] Treating Physician/Extender: Bradley [name] in Treatment: 8 Vital Signs Time Taken: 10:23 Temperature (F): 98.1 Pulse (bpm): 72 Respiratory Rate (breaths/min): 16 Blood Pressure (mmHg): 126/62 Reference Range: 80 - 120 mg / dl Electronic Signature(s) Signed: 04/26/2016 4:46:52 PM By: Montey Hora Entered By: Montey Hora on 04/26/2016 10:23:26

## 2016-05-02 DIAGNOSIS — I10 Essential (primary) hypertension: Secondary | ICD-10-CM | POA: Diagnosis not present

## 2016-05-02 DIAGNOSIS — M069 Rheumatoid arthritis, unspecified: Secondary | ICD-10-CM | POA: Diagnosis not present

## 2016-05-02 DIAGNOSIS — L8915 Pressure ulcer of sacral region: Secondary | ICD-10-CM | POA: Diagnosis not present

## 2016-05-02 DIAGNOSIS — Z87828 Personal history of other (healed) physical injury and trauma: Secondary | ICD-10-CM | POA: Diagnosis not present

## 2016-05-02 DIAGNOSIS — Z79899 Other long term (current) drug therapy: Secondary | ICD-10-CM | POA: Diagnosis not present

## 2016-05-02 DIAGNOSIS — G8221 Paraplegia, complete: Secondary | ICD-10-CM | POA: Diagnosis not present

## 2016-05-02 DIAGNOSIS — L89153 Pressure ulcer of sacral region, stage 3: Secondary | ICD-10-CM | POA: Diagnosis not present

## 2016-05-02 DIAGNOSIS — Z4800 Encounter for change or removal of nonsurgical wound dressing: Secondary | ICD-10-CM | POA: Diagnosis not present

## 2016-05-03 DIAGNOSIS — Z87828 Personal history of other (healed) physical injury and trauma: Secondary | ICD-10-CM | POA: Diagnosis not present

## 2016-05-03 DIAGNOSIS — Z4800 Encounter for change or removal of nonsurgical wound dressing: Secondary | ICD-10-CM | POA: Diagnosis not present

## 2016-05-03 DIAGNOSIS — I10 Essential (primary) hypertension: Secondary | ICD-10-CM | POA: Diagnosis not present

## 2016-05-03 DIAGNOSIS — M069 Rheumatoid arthritis, unspecified: Secondary | ICD-10-CM | POA: Diagnosis not present

## 2016-05-03 DIAGNOSIS — L89153 Pressure ulcer of sacral region, stage 3: Secondary | ICD-10-CM | POA: Diagnosis not present

## 2016-05-03 DIAGNOSIS — G8221 Paraplegia, complete: Secondary | ICD-10-CM | POA: Diagnosis not present

## 2016-05-03 NOTE — Progress Notes (Signed)
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 provide this for patient Dressing Change Frequency: Wound #5 Sacrum: Change dressing every day. Follow-up Appointments: Wound #5 Sacrum: Return Appointment in 2 weeks. Off-Loading: Wound #5 Sacrum: Roho cushion for wheelchair Mattress - continue air mattress Turn and reposition every 2 hours Additional Orders / Instructions: Wound #5 Sacrum: Increase protein intake. Home Health: Bradley [name], Bradley [name] ([id number]) Wound #5 Sacrum: [facility] Visits - HHRN to visit patient 2 times weekly when patient has an [facility] visit and 3 times weekly when patient does not have an [facility] visit. HHRN to provide dressing materials for patient to allow him to change dressing daily and as needed Home Health Nurse may visit PRN to address patient s wound care needs. FACE TO FACE ENCOUNTER: MEDICARE and MEDICAID PATIENTS: I certify that this patient is under my care and that I had a face-to-face encounter that meets the physician face-to-face encounter requirements with this patient on this date. The encounter with the  patient was in whole or in part for the following MEDICAL CONDITION: (primary reason for [place]) MEDICAL NECESSITY: I certify, that based on my findings, NURSING services are a medically necessary home health service. HOME BOUND STATUS: I certify that my clinical findings support that this patient is homebound (i.e., Due to illness or injury, pt requires aid of supportive devices such as crutches, cane, wheelchairs, walkers, the use of special transportation or the assistance of another person to leave their place of residence. There is a normal inability to leave the home and doing so requires considerable and taxing effort. Other absences are for medical reasons / religious services and are infrequent or of short duration when for other reasons). If current dressing causes regression in wound condition, may D/C ordered dressing product/s and apply Normal Saline Moist Dressing daily until next [place] / Other MD appointment. Blanket of regression in wound condition at [phone]. Please direct any NON-WOUND related issues/requests for orders to patient's Primary Care Physician Follow-Up Appointments: A follow-up appointment should be scheduled. A Patient Clinical Summary of Care was provided to [facility] 1. Continue cycle of offloading minimize sitting and supine position 2. Continue with dressings as previously ordered 3. Continue follow-up at wound care every 2 weeks 4. contact wound care office with any changes or concerns Electronic Signature(s) Signed: 05/02/2016 9:20:40 AM By: Rene Kocher, NP, Donnalee Cellucci Previous Signature: 04/26/2016 11:50:27 AM Version By: Rene Kocher, NP, [place]rix Console Entered By: Rene Kocher, NP, Auburn Hester on 05/02/2016 09:20:40 Bradley [name] ([id number]) -------------------------------------------------------------------------------- SuperBill Details Patient Name: Bradley Loa A. Date of Service: 04/26/2016 Medical Record Number: [id number] Patient  Account Number: [id number] Date of Birth/Sex: 1946/01/31 (70 y.o. Male) Treating RN: Montey Hora Primary Care Physician: [name] Other Clinician: Referring Physician: [name] Treating Physician/Extender: Cathie Olden in Treatment: 8 Diagnosis Coding ICD-10 Codes Code Description [phone] Pressure ulcer of sacral region, stage 3 G82.21 Paraplegia, complete Facility Procedures CPT4 Code: [id number] Description: 11042 - DEB SUBQ TISSUE 20 SQ CM/< ICD-10 Description  Diagnosis L89.153 Pressure ulcer of sacral region, stage 3 G82.21 Paraplegia, complete Modifier: Quantity: 1 CPT4 Code: [id number] Description: [id number] - DEB SUBQ TISS EA ADDL 20CM ICD-10 Description Diagnosis L89.153 Pressure ulcer of sacral region, stage 3 Modifier: Quantity: 1 Physician Procedures CPT4 Code: [id number] Description: [id number] - WC PHYS SUBQ TISS 20 SQ CM ICD-10 Description Diagnosis L89.153 Pressure ulcer of sacral region, stage 3 G82.21 Paraplegia, complete Modifier: Quantity: 1 CPT4 Code: [id number] Description: [id number] - WC PHYS SUBQ TISS EA ADDL 20 CM ICD-10 Description Diagnosis L89.153 Pressure ulcer of sacral region, stage 3 Modifier: Quantity: 1 Electronic Signature(s) Signed: 04/26/2016 11:50:45 AM By: Rene Kocher, NP, Tawny Asal, Gerda Diss ([id number]) Entered By: Rene Kocher, NP, Gottlieb Zuercher on 04/26/2016 11:50:45

## 2016-05-04 DIAGNOSIS — L89153 Pressure ulcer of sacral region, stage 3: Secondary | ICD-10-CM | POA: Diagnosis not present

## 2016-05-04 DIAGNOSIS — I10 Essential (primary) hypertension: Secondary | ICD-10-CM | POA: Diagnosis not present

## 2016-05-04 DIAGNOSIS — Z4800 Encounter for change or removal of nonsurgical wound dressing: Secondary | ICD-10-CM | POA: Diagnosis not present

## 2016-05-04 DIAGNOSIS — Z87828 Personal history of other (healed) physical injury and trauma: Secondary | ICD-10-CM | POA: Diagnosis not present

## 2016-05-04 DIAGNOSIS — G8221 Paraplegia, complete: Secondary | ICD-10-CM | POA: Diagnosis not present

## 2016-05-04 DIAGNOSIS — M069 Rheumatoid arthritis, unspecified: Secondary | ICD-10-CM | POA: Diagnosis not present

## 2016-05-06 DIAGNOSIS — Z87828 Personal history of other (healed) physical injury and trauma: Secondary | ICD-10-CM | POA: Diagnosis not present

## 2016-05-06 DIAGNOSIS — M069 Rheumatoid arthritis, unspecified: Secondary | ICD-10-CM | POA: Diagnosis not present

## 2016-05-06 DIAGNOSIS — I10 Essential (primary) hypertension: Secondary | ICD-10-CM | POA: Diagnosis not present

## 2016-05-06 DIAGNOSIS — L89153 Pressure ulcer of sacral region, stage 3: Secondary | ICD-10-CM | POA: Diagnosis not present

## 2016-05-06 DIAGNOSIS — Z4800 Encounter for change or removal of nonsurgical wound dressing: Secondary | ICD-10-CM | POA: Diagnosis not present

## 2016-05-06 DIAGNOSIS — G8221 Paraplegia, complete: Secondary | ICD-10-CM | POA: Diagnosis not present

## 2016-05-10 ENCOUNTER — Encounter: Payer: Commercial Managed Care - HMO | Attending: Internal Medicine | Admitting: Internal Medicine

## 2016-05-10 DIAGNOSIS — I10 Essential (primary) hypertension: Secondary | ICD-10-CM | POA: Insufficient documentation

## 2016-05-10 DIAGNOSIS — M069 Rheumatoid arthritis, unspecified: Secondary | ICD-10-CM | POA: Insufficient documentation

## 2016-05-10 DIAGNOSIS — L89153 Pressure ulcer of sacral region, stage 3: Secondary | ICD-10-CM | POA: Insufficient documentation

## 2016-05-10 DIAGNOSIS — G8221 Paraplegia, complete: Secondary | ICD-10-CM | POA: Diagnosis not present

## 2016-05-11 DIAGNOSIS — Z87828 Personal history of other (healed) physical injury and trauma: Secondary | ICD-10-CM | POA: Diagnosis not present

## 2016-05-11 DIAGNOSIS — M069 Rheumatoid arthritis, unspecified: Secondary | ICD-10-CM | POA: Diagnosis not present

## 2016-05-11 DIAGNOSIS — I10 Essential (primary) hypertension: Secondary | ICD-10-CM | POA: Diagnosis not present

## 2016-05-11 DIAGNOSIS — Z4800 Encounter for change or removal of nonsurgical wound dressing: Secondary | ICD-10-CM | POA: Diagnosis not present

## 2016-05-11 DIAGNOSIS — L89153 Pressure ulcer of sacral region, stage 3: Secondary | ICD-10-CM | POA: Diagnosis not present

## 2016-05-11 DIAGNOSIS — G8221 Paraplegia, complete: Secondary | ICD-10-CM | POA: Diagnosis not present

## 2016-05-11 NOTE — Progress Notes (Addendum)
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 him to change dressing daily and as needed Home Health Nurse may visit PRN to address patient s wound care needs. FACE TO FACE ENCOUNTER: MEDICARE and MEDICAID PATIENTS: I certify that this patient is under my care and that I had a face-to-face encounter that meets the physician face-to-face encounter requirements with this patient on this date. The encounter with the patient was in whole or in part for the following MEDICAL CONDITION: (primary reason for [place]) MEDICAL NECESSITY: I certify, that based on my findings, NURSING services are a medically necessary home health service. HOME BOUND STATUS: I certify that my clinical findings support that this patient is homebound (i.e., Due to illness or injury, pt requires aid of supportive devices such as crutches, cane, wheelchairs, walkers, the use of special transportation or the assistance of another person to leave their place of residence. There is  a normal inability to leave the home and doing so requires considerable and taxing effort. Other absences are for medical reasons / religious services and are infrequent or of short duration when for other reasons). If current dressing causes regression in wound condition, may D/C ordered dressing product/s and apply Normal Saline Moist Dressing daily until next [place] / Other MD appointment. [place] of regression in wound condition at [phone]. Please direct any NON-WOUND related issues/requests for orders to patient's Primary Care Physician Bradley [name], Bradley [name] ([id number]) #1 to continue with the Aquacel Ag because of the drainage issues. There is no evidence of infection, xtrasorb, ABD #2 we'll check into the issues with the amount of Aquacel he is getting. I'm not sure the Medihoney would help this wound much #3 he does not go back to plastic surgery at [facility]. I think they suggested to him a colostomy before they would consider a graft he did not want to do this. The wound does come to within close proximity to his rectum Electronic Signature(s) Signed: 05/12/2016 7:49:25 AM By: Gretta Cool RN, BSN, Kim RN, BSN Signed: 05/13/2016 2:36:16 AM By: Linton Ham MD Previous Signature: 05/10/2016 5:08:56 PM Version By: Linton Ham MD Entered By: Gretta Cool,  RN, BSN, Kim on 05/12/2016 07:49:25 Bradley [name] ([id number]) -------------------------------------------------------------------------------- [place] Details Patient Name: Bradley [name], Bradley [name]. Date of Service: 05/10/2016 Medical Record Patient Account Number: [id number] Number: Treating RN: Montey Hora [birth year] (70 y.o. Other Clinician: Date of Birth/Sex: Male) Treating Arly Salminen Primary Care Physician: [name] Physician/Extender: G Referring Physician: Abelardo Diesel in Treatment: 10 Diagnosis Coding ICD-10 Codes Code Description L89.153 Pressure ulcer of  sacral region, stage 3 G82.21 Paraplegia, complete Facility Procedures CPT4 Code: [id number] Description: 99213 - WOUND CARE VISIT-LEV 3 EST PT Modifier: Quantity: 1 Physician Procedures CPT4 Code: [id number] Description: [id number] - WC PHYS LEVEL 2 - EST PT ICD-10 Description Diagnosis L89.153 Pressure ulcer of sacral region, stage 3 Modifier: Quantity: 1 Electronic Signature(s) Signed: 05/11/2016 9:17:11 AM By: Linton Ham MD Previous Signature: 05/10/2016 5:08:56 PM Version By: Linton Ham MD Entered By: Linton Ham on 05/11/2016 09:16:21

## 2016-05-11 NOTE — Progress Notes (Signed)
Bradley, [name] ([id number]) Visit Report for 05/10/2016 Arrival Information Details Patient Name: Bradley [name], Bradley [name]. Date of Service: 05/10/2016 10:00 AM Medical Record Number: [id number] Patient Account Number: [id number] Date of Birth/Sex: 05-03-1946 (70 y.o. Male) Treating RN: Montey Hora Primary Care Physician: [name] Other Clinician: Referring Physician: [name] Treating Physician/Extender: Tito Dine in Treatment: 10 Visit Information History Since Last Visit Added or deleted any medications: No Patient Arrived: Wheel Chair Any new allergies or adverse reactions: No Arrival Time: 09:58 Had a fall or experienced change in No activities of daily living that may affect Accompanied By: self risk of falls: Transfer Assistance: Hoyer Lift Signs or symptoms of abuse/neglect since last No Patient Identification Verified: Yes visito Secondary Verification Process Yes Hospitalized since last visit: No Completed: Pain Present Now: No Electronic Signature(s) Signed: 05/10/2016 4:57:07 PM By: Montey Hora Entered By: Montey Hora on 05/10/2016 10:03:44 [name] -------------------------------------------------------------------------------- Clinic Level of Care Assessment Details Patient Name: Bradley Loa A. Date of Service: 05/10/2016 10:00 AM Medical Record Number: [id number] Patient Account Number: [id number] Date of Birth/Sex: [birth year] (70 y.o. Male) Treating RN: Montey Hora Primary Care Physician: [name] Other Clinician: Referring Physician: [name] Treating Physician/Extender: Tito Dine in Treatment: 10 Clinic Level of Care Assessment Items TOOL 4 Quantity Score []  - Use when only an EandM is performed on FOLLOW-UP visit 0 ASSESSMENTS - Nursing Assessment / Reassessment X - Reassessment of Co-morbidities (includes updates in patient status) 1 10 X - Reassessment of  Adherence to Treatment Plan 1 5 ASSESSMENTS - Wound and Skin Assessment / Reassessment X - Simple Wound Assessment / Reassessment - one wound 1 5 []  - Complex Wound Assessment / Reassessment - multiple wounds 0 []  - Dermatologic / Skin Assessment (not related to wound area) 0 ASSESSMENTS - Focused Assessment []  - Circumferential Edema Measurements - multi extremities 0 []  - Nutritional Assessment / Counseling / Intervention 0 []  - Lower Extremity Assessment (monofilament, tuning fork, pulses) 0 []  - Peripheral Arterial Disease Assessment (using hand held doppler) 0 ASSESSMENTS - Ostomy and/or Continence Assessment and Care []  - Incontinence Assessment and Management 0 []  - Ostomy Care Assessment and Management (repouching, etc.) 0 PROCESS - Coordination of Care X - Simple Patient / Family Education for ongoing care 1 15 []  - Complex (extensive) Patient / Family Education for ongoing care 0 []  - Staff obtains Programmer, systems, Records, Test Results / Process Orders 0 []  - Staff telephones HHA, Nursing Homes / Clarify orders / etc 0 []  - Routine Transfer to another Facility (non-emergent condition) 0 JANSON, SICOTTE ([id number]) []  - Routine Hospital Admission (non-emergent condition) 0 []  - New Admissions / Biomedical engineer / Ordering NPWT, Apligraf, etc. 0 []  - Emergency Hospital Admission (emergent condition) 0 X - Simple Discharge Coordination 1 10 []  - Complex (extensive) Discharge Coordination 0 PROCESS - Special Needs []  - Pediatric / Minor Patient Management 0 []  - Isolation Patient Management 0 []  - Hearing / Language / Visual special needs 0 []  - Assessment of Community assistance (transportation, D/C planning, etc.) 0 []  - Additional assistance / Altered mentation 0 []  - Support Surface(s) Assessment (bed, cushion, seat, etc.) 0 INTERVENTIONS - Wound Cleansing / Measurement X - Simple Wound Cleansing - one wound 1 5 []  - Complex Wound Cleansing - multiple wounds 0 X -  Wound Imaging (photographs - any number of wounds) 1 5 []  - Wound Tracing (instead of photographs) 0 X - Simple Wound Measurement - one  wound 1 5 []  - Complex Wound Measurement - multiple wounds 0 INTERVENTIONS - Wound Dressings []  - Small Wound Dressing one or multiple wounds 0 X - Medium Wound Dressing one or multiple wounds 1 15 []  - Large Wound Dressing one or multiple wounds 0 []  - Application of Medications - topical 0 []  - Application of Medications - injection 0 INTERVENTIONS - Miscellaneous []  - External ear exam 0 AJAHNI, BENZIGER A. ([id number]) []  - Specimen Collection (cultures, biopsies, blood, body fluids, etc.) 0 []  - Specimen(s) / Culture(s) sent or taken to Lab for analysis 0 []  - Patient Transfer (multiple staff / Harrel Lemon Lift / Similar devices) 0 []  - Simple Staple / Suture removal (25 or less) 0 []  - Complex Staple / Suture removal (26 or more) 0 []  - Hypo / Hyperglycemic Management (close monitor of Blood Glucose) 0 []  - Ankle / Brachial Index (ABI) - do not check if billed separately 0 X - Vital Signs 1 5 Has the patient been seen at the hospital within the last three years: Yes Total Score: 80 Level Of Care: New/Established - Level 3 Electronic Signature(s) Signed: 05/10/2016 4:57:07 PM By: Montey Hora Entered By: Montey Hora on 05/10/2016 10:22:22 [name] -------------------------------------------------------------------------------- Encounter Discharge Information Details Patient Name: Bradley Loa A. Date of Service: 05/10/2016 10:00 AM Medical Record Number: [id number] Patient Account Number: [id number] Date of Birth/Sex: April 18, 1946 (70 y.o. Male) Treating RN: Montey Hora Primary Care Physician: [name] Other Clinician: Referring Physician: [name] Treating Physician/Extender: Tito Dine in Treatment: 10 Encounter Discharge Information Items Discharge Pain Level: 0 Discharge Condition:  Stable Ambulatory Status: Wheelchair Discharge Destination: Home Transportation: Private Auto Accompanied By: self Schedule Follow-up Appointment: Yes Medication Reconciliation completed and provided to Patient/Care No Bradley [name]: Provided on Clinical Summary of Care: 05/10/2016 Form Type Recipient Paper Patient JG Electronic Signature(s) Signed: 05/10/2016 10:39:19 AM By: Ruthine Dose Entered By: Ruthine Dose on 05/10/2016 10:39:19 [name] -------------------------------------------------------------------------------- Lower Extremity Assessment Details Patient Name: Bradley Loa A. Date of Service: 05/10/2016 10:00 AM Medical Record Number: [id number] Patient Account Number: [id number] Date of Birth/Sex: 22-Oct-1945 (70 y.o. Male) Treating RN: Montey Hora Primary Care Physician: [name] Other Clinician: Referring Physician: [name] Treating Physician/Extender: Ricard Dillon Weeks in Treatment: 10 Electronic Signature(s) Signed: 05/10/2016 4:57:07 PM By: Montey Hora Entered By: Montey Hora on 05/10/2016 10:10:34 [name] -------------------------------------------------------------------------------- Multi Wound Chart Details Patient Name: PARMINDER, FORTON A. Date of Service: 05/10/2016 10:00 AM Medical Record Number: [id number] Patient Account Number: [id number] Date of Birth/Sex: 07/20/1945 (70 y.o. Male) Treating RN: Montey Hora Primary Care Physician: [name] Other Clinician: Referring Physician: [name] Treating Physician/Extender: Tito Dine in Treatment: 10 Vital Signs Height(in): Pulse(bpm): 76 Weight(lbs): Blood Pressure 137/69 (mmHg): Body Mass Index(BMI): Temperature(F): 97.9 Respiratory Rate 18 (breaths/min): Photos: [5:No Photos] [N/A:N/A] Wound Location: [5:Sacrum] [N/A:N/A] Wounding Event: [5:Pressure Injury] [N/A:N/A] Primary Etiology:  [5:Pressure Ulcer] [N/A:N/A] Comorbid History: [5:Hypertension, Rheumatoid Arthritis, Paraplegia] [N/A:N/A] Date Acquired: [5:11/05/2014] [N/A:N/A] Weeks of Treatment: [5:10] [N/A:N/A] Wound Status: [5:Open] [N/A:N/A] Measurements L x W x D 12.6x8.7x2.5 [N/A:N/A] (cm) Area (cm) : [5:86.095] [N/A:N/A] Volume (cm) : [5:215.238] [N/A:N/A] % Reduction in Area: [5:-25.30%] [N/A:N/A] % Reduction in Volume: -30.50% [N/A:N/A] Classification: [5:Category/Stage III] [N/A:N/A] Exudate Amount: [5:Large] [N/A:N/A] Exudate Type: [5:Serous] [N/A:N/A] Exudate Color: [5:amber] [N/A:N/A] Wound Margin: [5:Thickened] [N/A:N/A] Granulation Amount: [5:Large (67-100%)] [N/A:N/A] Granulation Quality: [5:Red] [N/A:N/A] Necrotic Amount: [5:Small (1-33%)] [N/A:N/A] Exposed Structures: [5:Fat: Yes Fascia: No Tendon: No Muscle: No  Joint: No Bone: No] [N/A:N/A] Epithelialization: None N/A N/A Periwound Skin Texture: Edema: No N/A N/A Excoriation: No Induration: No Callus: No Crepitus: No Fluctuance: No Friable: No Rash: No Scarring: No Periwound Skin Maceration: Yes N/A N/A Moisture: Moist: Yes Dry/Scaly: No Periwound Skin Color: Atrophie Blanche: No N/A N/A Cyanosis: No Ecchymosis: No Erythema: No Hemosiderin Staining: No Mottled: No Pallor: No Rubor: No Temperature: No Abnormality N/A N/A Tenderness on No N/A N/A Palpation: Wound Preparation: Ulcer Cleansing: N/A N/A Rinsed/Irrigated with Saline Topical Anesthetic Applied: Other: lidocaine 4% Treatment Notes Electronic Signature(s) Signed: 05/10/2016 4:57:07 PM By: Montey Hora Entered By: Montey Hora on 05/10/2016 10:14:02 [name] -------------------------------------------------------------------------------- [place] Details Patient Name: JULIS, GANNETT A. Date of Service: 05/10/2016 10:00 AM Medical Record Number: [id number] Patient Account Number: [id number] Date of Birth/Sex:  [birth year] (70 y.o. Male) Treating RN: Montey Hora Primary Care Physician: [name] Other Clinician: Referring Physician: [name] Treating Physician/Extender: Tito Dine in Treatment: 10 Active Inactive Abuse / Safety / Falls / Self Care Management Nursing Diagnoses: Impaired physical mobility Potential for falls Goals: Patient will remain injury free Date Initiated: 03/01/2016 Goal Status: Active Interventions: Assess fall risk on admission and as needed Notes: Pressure Nursing Diagnoses: Knowledge deficit related to causes and risk factors for pressure ulcer development Goals: Patient will remain free from development of additional pressure ulcers Date Initiated: 03/01/2016 Goal Status: Active Interventions: Provide education on pressure ulcers Notes: Wound/Skin Impairment Nursing Diagnoses: Impaired tissue integrity Goals: Patient/caregiver will verbalize understanding of skin care regimen DORSE, COUVERTIER ([id number]) Date Initiated: 03/01/2016 Goal Status: Active Ulcer/skin breakdown will have a volume reduction of 30% by week 4 Date Initiated: 03/01/2016 Goal Status: Active Ulcer/skin breakdown will have a volume reduction of 50% by week 8 Date Initiated: 03/01/2016 Goal Status: Active Ulcer/skin breakdown will have a volume reduction of 80% by week 12 Date Initiated: 03/01/2016 Goal Status: Active Ulcer/skin breakdown will heal within 14 weeks Date Initiated: 03/01/2016 Goal Status: Active Interventions: Assess patient/caregiver ability to obtain necessary supplies Assess patient/caregiver ability to perform ulcer/skin care regimen upon admission and as needed Assess ulceration(s) every visit Notes: Electronic Signature(s) Signed: 05/10/2016 4:57:07 PM By: Montey Hora Entered By: Montey Hora on 05/10/2016 10:13:55 [name]  ([id number]) -------------------------------------------------------------------------------- Pain Assessment Details Patient Name: Bradley Loa A. Date of Service: 05/10/2016 10:00 AM Medical Record Number: [id number] Patient Account Number: [id number] Date of Birth/Sex: August 01, 1945 (70 y.o. Male) Treating RN: Montey Hora Primary Care Physician: [name] Other Clinician: Referring Physician: [name] Treating Physician/Extender: Tito Dine in Treatment: 10 Active Problems Location of Pain Severity and Description of Pain Patient Has Paino No Site Locations Pain Management and Medication Current Pain Management: Notes Topical or injectable lidocaine is offered to patient for acute pain when surgical debridement is performed. If needed, Patient is instructed to use over the counter pain medication for the following 24-48 hours after debridement. Wound care MDs do not prescribed pain medications. Patient has chronic pain or uncontrolled pain. Patient has been instructed to make an appointment with their Primary Care Physician for pain management. Electronic Signature(s) Signed: 05/10/2016 4:57:07 PM By: Montey Hora Entered By: Montey Hora on 05/10/2016 10:03:57 [name] -------------------------------------------------------------------------------- Patient/Caregiver Education Details Patient Name: [name] Date of Service: 05/10/2016 10:00 AM Medical Record Number: [id number] Patient Account Number: [id number] Date of Birth/Gender: 10-26-1945 (70 y.o. Male) Treating RN: Montey Hora Primary Care Physician: [name] Other Clinician: Referring Physician: [name] Treating  Physician/Extender: Tito Dine in Treatment: 10 Education Assessment Education Provided To: Patient Education Topics Provided Wound/Skin Impairment: Handouts: Other: wound care to continue as ordered Methods:  Demonstration, Explain/Verbal Responses: State content correctly Electronic Signature(s) Signed: 05/10/2016 4:57:07 PM By: Montey Hora Entered By: Montey Hora on 05/10/2016 10:23:29 [name] -------------------------------------------------------------------------------- Wound Assessment Details Patient Name: Bradley Loa A. Date of Service: 05/10/2016 10:00 AM Medical Record Number: [id number] Patient Account Number: [id number] Date of Birth/Sex: 01-15-1946 (70 y.o. Male) Treating RN: Montey Hora Primary Care Physician: [name] Other Clinician: Referring Physician: [name] Treating Physician/Extender: Ricard Dillon Weeks in Treatment: 10 Wound Status Wound Number: 5 Primary Pressure Ulcer Etiology: Wound Location: Sacrum Wound Status: Open Wounding Event: Pressure Injury Comorbid Hypertension, Rheumatoid Arthritis, Date Acquired: 11/05/2014 History: Paraplegia Weeks Of Treatment: 10 Clustered Wound: No Photos Wound Measurements Length: (cm) 12.6 Width: (cm) 8.7 Depth: (cm) 2.5 Area: (cm) 86.095 Volume: (cm) 215.238 % Reduction in Area: -25.3% % Reduction in Volume: -30.5% Epithelialization: None Tunneling: No Undermining: No Wound Description Classification: Category/Stage III Wound Margin: Thickened Exudate Amount: Large Exudate Type: Serous Exudate Color: amber Foul Odor After Cleansing: No Wound Bed Granulation Amount: Large (67-100%) Exposed Structure Granulation Quality: Red Fascia Exposed: No Necrotic Amount: Small (1-33%) Fat Layer Exposed: Yes Necrotic Quality: Adherent Slough Tendon Exposed: No Muscle Exposed: No LAROME, SHADBOLT A. ([id number]) Joint Exposed: No Bone Exposed: No Periwound Skin Texture Texture Color No Abnormalities Noted: No No Abnormalities Noted: No Callus: No Atrophie Blanche: No Crepitus: No Cyanosis: No Excoriation: No Ecchymosis: No Fluctuance: No Erythema:  No Friable: No Hemosiderin Staining: No Induration: No Mottled: No Localized Edema: No Pallor: No Rash: No Rubor: No Scarring: No Temperature / Pain Moisture Temperature: No Abnormality No Abnormalities Noted: No Dry / Scaly: No Maceration: Yes Moist: Yes Wound Preparation Ulcer Cleansing: Rinsed/Irrigated with Saline Topical Anesthetic Applied: Other: lidocaine 4%, Treatment Notes Wound #5 (Sacrum) 1. Cleansed with: Clean wound with Normal Saline 2. Anesthetic Topical Lidocaine 4% cream to wound bed prior to debridement 4. Dressing Applied: Aquacel Ag Other dressing (specify in notes) 5. Secondary Dressing Applied ABD Pad Dry Gauze Notes xtrasorb Electronic Signature(s) Signed: 05/10/2016 4:57:07 PM By: Montey Hora Entered By: Montey Hora on 05/10/2016 11:12:16 [name] -------------------------------------------------------------------------------- [place] Details Patient Name: Bradley Loa A. Date of Service: 05/10/2016 10:00 AM Medical Record Number: [id number] Patient Account Number: [id number] Date of Birth/Sex: 1945/10/01 (70 y.o. Male) Treating RN: Montey Hora Primary Care Physician: [name] Other Clinician: Referring Physician: [name] Treating Physician/Extender: Ricard Dillon Weeks in Treatment: 10 Vital Signs Time Taken: 10:04 Temperature (F): 97.9 Pulse (bpm): 76 Respiratory Rate (breaths/min): 18 Blood Pressure (mmHg): 137/69 Reference Range: 80 - 120 mg / dl Electronic Signature(s) Signed: 05/10/2016 4:57:07 PM By: Montey Hora Entered By: Montey Hora on 05/10/2016 10:04:14

## 2016-05-13 DIAGNOSIS — M069 Rheumatoid arthritis, unspecified: Secondary | ICD-10-CM | POA: Diagnosis not present

## 2016-05-13 DIAGNOSIS — L89153 Pressure ulcer of sacral region, stage 3: Secondary | ICD-10-CM | POA: Diagnosis not present

## 2016-05-13 DIAGNOSIS — I10 Essential (primary) hypertension: Secondary | ICD-10-CM | POA: Diagnosis not present

## 2016-05-13 DIAGNOSIS — Z87828 Personal history of other (healed) physical injury and trauma: Secondary | ICD-10-CM | POA: Diagnosis not present

## 2016-05-13 DIAGNOSIS — G8221 Paraplegia, complete: Secondary | ICD-10-CM | POA: Diagnosis not present

## 2016-05-13 DIAGNOSIS — Z4800 Encounter for change or removal of nonsurgical wound dressing: Secondary | ICD-10-CM | POA: Diagnosis not present

## 2016-05-18 DIAGNOSIS — Z4800 Encounter for change or removal of nonsurgical wound dressing: Secondary | ICD-10-CM | POA: Diagnosis not present

## 2016-05-18 DIAGNOSIS — L89153 Pressure ulcer of sacral region, stage 3: Secondary | ICD-10-CM | POA: Diagnosis not present

## 2016-05-18 DIAGNOSIS — Z87828 Personal history of other (healed) physical injury and trauma: Secondary | ICD-10-CM | POA: Diagnosis not present

## 2016-05-18 DIAGNOSIS — M069 Rheumatoid arthritis, unspecified: Secondary | ICD-10-CM | POA: Diagnosis not present

## 2016-05-18 DIAGNOSIS — I10 Essential (primary) hypertension: Secondary | ICD-10-CM | POA: Diagnosis not present

## 2016-05-18 DIAGNOSIS — G8221 Paraplegia, complete: Secondary | ICD-10-CM | POA: Diagnosis not present

## 2016-05-20 DIAGNOSIS — Z4800 Encounter for change or removal of nonsurgical wound dressing: Secondary | ICD-10-CM | POA: Diagnosis not present

## 2016-05-20 DIAGNOSIS — Z87828 Personal history of other (healed) physical injury and trauma: Secondary | ICD-10-CM | POA: Diagnosis not present

## 2016-05-20 DIAGNOSIS — I10 Essential (primary) hypertension: Secondary | ICD-10-CM | POA: Diagnosis not present

## 2016-05-20 DIAGNOSIS — L89153 Pressure ulcer of sacral region, stage 3: Secondary | ICD-10-CM | POA: Diagnosis not present

## 2016-05-20 DIAGNOSIS — M069 Rheumatoid arthritis, unspecified: Secondary | ICD-10-CM | POA: Diagnosis not present

## 2016-05-20 DIAGNOSIS — G8221 Paraplegia, complete: Secondary | ICD-10-CM | POA: Diagnosis not present

## 2016-05-23 DIAGNOSIS — M069 Rheumatoid arthritis, unspecified: Secondary | ICD-10-CM | POA: Diagnosis not present

## 2016-05-23 DIAGNOSIS — Z87828 Personal history of other (healed) physical injury and trauma: Secondary | ICD-10-CM | POA: Diagnosis not present

## 2016-05-23 DIAGNOSIS — L89153 Pressure ulcer of sacral region, stage 3: Secondary | ICD-10-CM | POA: Diagnosis not present

## 2016-05-23 DIAGNOSIS — Z4800 Encounter for change or removal of nonsurgical wound dressing: Secondary | ICD-10-CM | POA: Diagnosis not present

## 2016-05-23 DIAGNOSIS — G8221 Paraplegia, complete: Secondary | ICD-10-CM | POA: Diagnosis not present

## 2016-05-23 DIAGNOSIS — I10 Essential (primary) hypertension: Secondary | ICD-10-CM | POA: Diagnosis not present

## 2016-05-24 ENCOUNTER — Encounter: Payer: Commercial Managed Care - HMO | Admitting: Internal Medicine

## 2016-05-24 DIAGNOSIS — I10 Essential (primary) hypertension: Secondary | ICD-10-CM | POA: Diagnosis not present

## 2016-05-24 DIAGNOSIS — L89153 Pressure ulcer of sacral region, stage 3: Secondary | ICD-10-CM | POA: Diagnosis not present

## 2016-05-24 DIAGNOSIS — M069 Rheumatoid arthritis, unspecified: Secondary | ICD-10-CM | POA: Diagnosis not present

## 2016-05-24 DIAGNOSIS — G8221 Paraplegia, complete: Secondary | ICD-10-CM | POA: Diagnosis not present

## 2016-05-25 DIAGNOSIS — Z87828 Personal history of other (healed) physical injury and trauma: Secondary | ICD-10-CM | POA: Diagnosis not present

## 2016-05-25 DIAGNOSIS — M069 Rheumatoid arthritis, unspecified: Secondary | ICD-10-CM | POA: Diagnosis not present

## 2016-05-25 DIAGNOSIS — G8221 Paraplegia, complete: Secondary | ICD-10-CM | POA: Diagnosis not present

## 2016-05-25 DIAGNOSIS — Z4800 Encounter for change or removal of nonsurgical wound dressing: Secondary | ICD-10-CM | POA: Diagnosis not present

## 2016-05-25 DIAGNOSIS — L89153 Pressure ulcer of sacral region, stage 3: Secondary | ICD-10-CM | POA: Diagnosis not present

## 2016-05-25 DIAGNOSIS — I10 Essential (primary) hypertension: Secondary | ICD-10-CM | POA: Diagnosis not present

## 2016-05-25 NOTE — Progress Notes (Addendum)
CHAPIN, BRESTER ([id number]) Visit Report for 05/24/2016 Arrival Information Details Patient Name: Bradley [name], Bradley [name]. Date of Service: 05/24/2016 10:00 AM Medical Record Number: [id number] Patient Account Number: [id number] Date of Birth/Sex: 06-25-1945 (70 y.o. Male) Treating RN: Montey Hora Primary Care Physician: [name] Other Clinician: Referring Physician: [name] Treating Physician/Extender: Tito Dine in Treatment: 12 Visit Information History Since Last Visit Added or deleted any medications: No Patient Arrived: Wheel Chair Any new allergies or adverse reactions: No Arrival Time: 10:19 Had a fall or experienced change in No activities of daily living that may affect Accompanied By: self risk of falls: Transfer Assistance: Hoyer Lift Signs or symptoms of abuse/neglect since last No Patient Identification Verified: Yes visito Secondary Verification Process Yes Hospitalized since last visit: No Completed: Pain Present Now: No Electronic Signature(s) Signed: 05/24/2016 4:40:41 PM By: Montey Hora Entered By: Montey Hora on 05/24/2016 10:19:57 [name] -------------------------------------------------------------------------------- Complex / Palliative Patient Assessment Details Patient Name: Bradley Loa A. Date of Service: 05/24/2016 10:00 AM Medical Record Number: [id number] Patient Account Number: [id number] Date of Birth/Sex: 03-18-1946 (70 y.o. Male) Treating RN: Cornell Barman Primary Care Physician: [name] Other Clinician: Referring Physician: [name] Treating Physician/Extender: Tito Dine in Treatment: 12 Palliative Management Criteria Complex Wound Management Criteria Patient requires a surgical procedure in order to achieve wound healing: flap However, the surgeon has determined that the patient is not a surgical candidate due to medical status. Patient has  remarkable or complex co-morbidities requiring medications or treatments that extend wound healing times. Examples: o Diabetes mellitus with chronic renal failure or end stage renal disease requiring dialysis o Advanced or poorly controlled rheumatoid arthritis o Diabetes mellitus and end stage chronic obstructive pulmonary disease o Active cancer with current chemo- or radiation therapy parapalegia The patient has limited personal or cognitive resources, or has no access to appropriate ongoing care providers, such that it is unreasonable to expect a level of compliance with prescribed advanced wound care treatments necessary to achieve desired healing outcomes. (Must be documented in physician progress notes.) Care Approach Wound Care Plan: Complex Wound Management Electronic Signature(s) Signed: 06/08/2016 12:41:14 PM By: Gretta Cool RN, BSN, Kim RN, BSN Signed: 06/21/2016 7:36:33 AM By: Linton Ham MD Previous Signature: 06/08/2016 12:40:16 PM Version By: Gretta Cool RN, BSN, Kim RN, BSN Entered By: Gretta Cool, RN, BSN, Kim on 06/08/2016 12:41:14 [name] -------------------------------------------------------------------------------- Encounter Discharge Information Details Patient Name: Bradley [name], Bradley [name]. Date of Service: 05/24/2016 10:00 AM Medical Record Number: [id number] Patient Account Number: [id number] Date of Birth/Sex: 03/31/1946 (70 y.o. Male) Treating RN: Montey Hora Primary Care Physician: [name] Other Clinician: Referring Physician: [name] Treating Physician/Extender: Tito Dine in Treatment: 12 Encounter Discharge Information Items Discharge Pain Level: 0 Discharge Condition: Stable Ambulatory Status: Wheelchair Discharge Destination: Home Transportation: Private Auto Accompanied By: self Schedule Follow-up Appointment: Yes Medication Reconciliation completed and provided to Patient/Care  No Jyla Hopf: Provided on Clinical Summary of Care: 05/24/2016 Form Type Recipient Paper Patient JG Electronic Signature(s) Signed: 05/24/2016 11:25:59 AM By: Ruthine Dose Entered By: Ruthine Dose on 05/24/2016 11:25:59 [name] -------------------------------------------------------------------------------- Multi Wound Chart Details Patient Name: Bradley Loa A. Date of Service: 05/24/2016 10:00 AM Medical Record Number: [id number] Patient Account Number: [id number] Date of Birth/Sex: 1946-05-20 (70 y.o. Male) Treating RN: Montey Hora Primary Care Physician: [name] Other Clinician: Referring Physician: [name] Treating Physician/Extender: Tito Dine in Treatment: 12 Vital Signs Height(in): Pulse(bpm): 81 Weight(lbs): Blood Pressure  116/74 (mmHg): Body Mass Index(BMI): Temperature(F): 98.1 Respiratory Rate 16 (breaths/min): Photos: [5:No Photos] [N/A:N/A] Wound Location: [5:Sacrum] [N/A:N/A] Wounding Event: [5:Pressure Injury] [N/A:N/A] Primary Etiology: [5:Pressure Ulcer] [N/A:N/A] Comorbid History: [5:Hypertension, Rheumatoid Arthritis, Paraplegia] [N/A:N/A] Date Acquired: [5:11/05/2014] [N/A:N/A] Weeks of Treatment: [5:12] [N/A:N/A] Wound Status: [5:Open] [N/A:N/A] Measurements L x W x D 12.4x8.6x2.2 [N/A:N/A] (cm) Area (cm) : [5:83.755] [N/A:N/A] Volume (cm) : [5:184.261] [N/A:N/A] % Reduction in Area: [5:-21.90%] [N/A:N/A] % Reduction in Volume: -11.70% [N/A:N/A] Classification: [5:Category/Stage III] [N/A:N/A] Exudate Amount: [5:Large] [N/A:N/A] Exudate Type: [5:Serous] [N/A:N/A] Exudate Color: [5:amber] [N/A:N/A] Wound Margin: [5:Thickened] [N/A:N/A] Granulation Amount: [5:Large (67-100%)] [N/A:N/A] Granulation Quality: [5:Red] [N/A:N/A] Necrotic Amount: [5:Small (1-33%)] [N/A:N/A] Exposed Structures: [5:Fat: Yes Fascia: No Tendon: No Muscle: No Joint: No Bone: No]  [N/A:N/A] Epithelialization: None N/A N/A Debridement: Debridement [id number] N/A N/A [id number]) Pre-procedure 10:56 N/A N/A Verification/Time Out Taken: Pain Control: Lidocaine 4% Topical N/A N/A Solution Tissue Debrided: Other, Skin, N/A N/A Subcutaneous Level: Skin/Subcutaneous N/A N/A Tissue Debridement Area (sq 109.12 N/A N/A cm): Instrument: Blade, Curette, Forceps N/A N/A Bleeding: Large N/A N/A Hemostasis Achieved: Silver Nitrate N/A N/A Procedural Pain: 0 N/A N/A Post Procedural Pain: 0 N/A N/A Debridement Treatment Procedure was tolerated N/A N/A Response: well Post Debridement 12.4x8.6x2.2 N/A N/A Measurements L x W x D (cm) Post Debridement 184.261 N/A N/A Volume: (cm) Post Debridement Category/Stage III N/A N/A Stage: Periwound Skin Texture: Edema: No N/A N/A Excoriation: No Induration: No Callus: No Crepitus: No Fluctuance: No Friable: No Rash: No Scarring: No Periwound Skin Maceration: Yes N/A N/A Moisture: Moist: Yes Dry/Scaly: No Periwound Skin Color: Atrophie Blanche: No N/A N/A Cyanosis: No Ecchymosis: No Erythema: No Hemosiderin Staining: No Mottled: No Pallor: No Rubor: No Temperature: No Abnormality N/A N/A No N/A N/A Bradley [name], Bradley A. ([id number]) Tenderness on Palpation: Wound Preparation: Ulcer Cleansing: N/A N/A Rinsed/Irrigated with Saline Topical Anesthetic Applied: Other: lidocaine 4% Procedures Performed: Debridement N/A N/A Treatment Notes Wound #5 (Sacrum) 1. Cleansed with: Clean wound with Normal Saline 2. Anesthetic Topical Lidocaine 4% cream to wound bed prior to debridement 3. Peri-wound Care: Skin Prep 4. Dressing Applied: Aquacel Ag Other dressing (specify in notes) 5. Secondary Dressing Applied ABD Pad Notes xtrasorb Electronic Signature(s) Signed: 05/24/2016 5:07:10 PM By: Linton Ham MD Entered By: Linton Ham on 05/24/2016 11:33:25 [name]  ([id number]) -------------------------------------------------------------------------------- [place] Details Patient Name: Bradley [name], Bradley A. Date of Service: 05/24/2016 10:00 AM Medical Record Number: [id number] Patient Account Number: [id number] Date of Birth/Sex: 11/20/1945 (70 y.o. Male) Treating RN: Montey Hora Primary Care Physician: [name] Other Clinician: Referring Physician: [name] Treating Physician/Extender: Tito Dine in Treatment: 12 Active Inactive Abuse / Safety / Falls / Self Care Management Nursing Diagnoses: Impaired physical mobility Potential for falls Goals: Patient will remain injury free Date Initiated: 03/01/2016 Goal Status: Active Interventions: Assess fall risk on admission and as needed Notes: Pressure Nursing Diagnoses: Knowledge deficit related to causes and risk factors for pressure ulcer development Goals: Patient will remain free from development of additional pressure ulcers Date Initiated: 03/01/2016 Goal Status: Active Interventions: Provide education on pressure ulcers Notes: Wound/Skin Impairment Nursing Diagnoses: Impaired tissue integrity Goals: Patient/caregiver will verbalize understanding of skin care regimen Bradley [name], Bradley [name] ([id number]) Date Initiated: 03/01/2016 Goal Status: Active Ulcer/skin breakdown will have a volume reduction of 30% by week 4 Date Initiated: 03/01/2016 Goal Status: Active Ulcer/skin breakdown will have a volume reduction of 50% by week 8 Date Initiated: 03/01/2016 Goal Status: Active Ulcer/skin breakdown will have a  volume reduction of 80% by week 12 Date Initiated: 03/01/2016 Goal Status: Active Ulcer/skin breakdown will heal within 14 weeks Date Initiated: 03/01/2016 Goal Status: Active Interventions: Assess patient/caregiver ability to obtain necessary supplies Assess patient/caregiver ability to perform ulcer/skin care regimen upon admission  and as needed Assess ulceration(s) every visit Notes: Electronic Signature(s) Signed: 05/24/2016 4:40:41 PM By: Montey Hora Entered By: Montey Hora on 05/24/2016 10:33:31 [name] -------------------------------------------------------------------------------- Pain Assessment Details Patient Name: Bradley Loa A. Date of Service: 05/24/2016 10:00 AM Medical Record Number: [id number] Patient Account Number: [id number] Date of Birth/Sex: April 16, 1946 (70 y.o. Male) Treating RN: Montey Hora Primary Care Physician: [name] Other Clinician: Referring Physician: [name] Treating Physician/Extender: Tito Dine in Treatment: 12 Active Problems Location of Pain Severity and Description of Pain Patient Has Paino No Site Locations Pain Management and Medication Current Pain Management: Notes Topical or injectable lidocaine is offered to patient for acute pain when surgical debridement is performed. If needed, Patient is instructed to use over the counter pain medication for the following 24-48 hours after debridement. Wound care MDs do not prescribed pain medications. Patient has chronic pain or uncontrolled pain. Patient has been instructed to make an appointment with their Primary Care Physician for pain management. Electronic Signature(s) Signed: 05/24/2016 4:40:41 PM By: Montey Hora Entered By: Montey Hora on 05/24/2016 10:20:18 [name] -------------------------------------------------------------------------------- Patient/Caregiver Education Details Patient Name: [name] Date of Service: 05/24/2016 10:00 AM Medical Record Number: [id number] Patient Account Number: [id number] Date of Birth/Gender: [birth year] (70 y.o. Male) Treating RN: Montey Hora Primary Care Physician: [name] Other Clinician: Referring Physician: [name] Treating Physician/Extender: Tito Dine in Treatment: 12 Education Assessment Education Provided To: Patient Education Topics Provided Offloading: Handouts: Other: continue offloading as much as possible Methods: Explain/Verbal Responses: State content correctly Electronic Signature(s) Signed: 05/24/2016 4:40:41 PM By: Montey Hora Entered By: Montey Hora on 05/24/2016 10:57:19 [name] -------------------------------------------------------------------------------- Wound Assessment Details Patient Name: Bradley Loa A. Date of Service: 05/24/2016 10:00 AM Medical Record Number: [id number] Patient Account Number: [id number] Date of Birth/Sex: Mar 21, 1946 (70 y.o. Male) Treating RN: Montey Hora Primary Care Physician: [name] Other Clinician: Referring Physician: [name] Treating Physician/Extender: Ricard Dillon Weeks in Treatment: 12 Wound Status Wound Number: 5 Primary Pressure Ulcer Etiology: Wound Location: Sacrum Wound Status: Open Wounding Event: Pressure Injury Comorbid Hypertension, Rheumatoid Arthritis, Date Acquired: 11/05/2014 History: Paraplegia Weeks Of Treatment: 12 Clustered Wound: No Photos Wound Measurements Length: (cm) 12.4 Width: (cm) 8.6 Depth: (cm) 2.2 Area: (cm) 83.755 Volume: (cm) 184.261 % Reduction in Area: -21.9% % Reduction in Volume: -11.7% Epithelialization: None Tunneling: No Undermining: No Wound Description Classification: Category/Stage III Wound Margin: Thickened Exudate Amount: Large Exudate Type: Serous Exudate Color: amber Foul Odor After Cleansing: No Wound Bed Granulation Amount: Large (67-100%) Exposed Structure Granulation Quality: Red Fascia Exposed: No Necrotic Amount: Small (1-33%) Fat Layer Exposed: Yes Necrotic Quality: Adherent Slough Tendon Exposed: No Muscle Exposed: No Bradley [name], Bradley A. ([id number]) Joint Exposed: No Bone Exposed: No Periwound Skin Texture Texture  Color No Abnormalities Noted: No No Abnormalities Noted: No Callus: No Atrophie Blanche: No Crepitus: No Cyanosis: No Excoriation: No Ecchymosis: No Fluctuance: No Erythema: No Friable: No Hemosiderin Staining: No Induration: No Mottled: No Localized Edema: No Pallor: No Rash: No Rubor: No Scarring: No Temperature / Pain Moisture Temperature: No Abnormality No Abnormalities Noted: No Dry / Scaly: No Maceration: Yes Moist: Yes Wound Preparation Ulcer Cleansing: Rinsed/Irrigated with Saline  Topical Anesthetic Applied: Other: lidocaine 4%, Electronic Signature(s) Signed: 05/24/2016 4:40:41 PM By: Montey Hora Entered By: Montey Hora on 05/24/2016 14:32:54 [name] -------------------------------------------------------------------------------- Vitals Details Patient Name: Bradley Loa A. Date of Service: 05/24/2016 10:00 AM Medical Record Number: [id number] Patient Account Number: [id number] Date of Birth/Sex: 09-05-1945 (70 y.o. Male) Treating RN: Montey Hora Primary Care Physician: [name] Other Clinician: Referring Physician: [name] Treating Physician/Extender: Ricard Dillon Weeks in Treatment: 12 Vital Signs Time Taken: 10:20 Temperature (F): 98.1 Pulse (bpm): 81 Respiratory Rate (breaths/min): 16 Blood Pressure (mmHg): 116/74 Reference Range: 80 - 120 mg / dl Electronic Signature(s) Signed: 05/24/2016 4:40:41 PM By: Montey Hora Entered By: Montey Hora on 05/24/2016 10:20:38

## 2016-05-25 NOTE — Progress Notes (Addendum)
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 no acute distress. Well nourished and well developed.. Vitals Time Taken: 10:20 AM, Temperature: 98.1 F, Pulse: 81 bpm, Respiratory Rate: 16 breaths/min, Blood Pressure: 116/74 mmHg. General Notes: Wound exam; fairly substantial wound over his sacrum and lower coccyx with the tip at the coccyx level. Over the Botox left greater than right. Large stage III wound. The surface of the wound looks stable nevertheless because of the lack of healing I went ahead and did a substantial debridement of the area removing surface slough and nonviable subcutaneous tissue using an open curet. Also using scalpel and pickups I remove nonviable tissue from the circumference of the wound to see if we can stimulate healing. There is no evidence of surrounding infection and while he is here I see no obvious drainage Integumentary (Hair, Skin) Wound #5 status is  Open. Original cause of wound was Pressure Injury. The wound is located on the Sacrum. The wound measures 12.4cm length x 8.6cm width x 2.2cm depth; 83.755cm^2 area and 184.261cm^3 volume. There is fat exposed. There is no tunneling or undermining noted. There is a large amount of serous drainage noted. The wound margin is thickened. There is large (67-100%) red granulation within the wound bed. There is a small (1-33%) amount of necrotic tissue within the wound bed including Adherent Slough. The periwound skin appearance exhibited: Maceration, Moist. The periwound skin appearance did not exhibit: Callus, Crepitus, Excoriation, Fluctuance, Friable, Induration, Localized Edema, Rash, Scarring, Dry/Scaly, Atrophie Blanche, Cyanosis, Ecchymosis, Hemosiderin Staining, Mottled, Pallor, Rubor, Erythema. Periwound temperature was noted as No Abnormality. Assessment Active Problems ICD-10 L89.153 - Pressure ulcer of sacral region, stage 3 G82.21 - Paraplegia, complete Bradley [name], Bradley A. ([id number]) Procedures Wound #5 Wound #5 is a Pressure Ulcer located on the Sacrum . There was a Skin/Subcutaneous Tissue Debridement [id number]) debridement with total area of 109.12 sq cm performed by Ricard Dillon, MD. with the following instrument(s): Blade, Curette, and Forceps to remove Viable and Non- Viable tissue/material including Other, Skin, and Subcutaneous after achieving pain control using Lidocaine 4% Topical Solution. A time out was conducted at 10:56, prior to the start of the procedure. A  Large amount of bleeding was controlled with Silver Nitrate. The procedure was tolerated well with a pain level of 0 throughout and a pain level of 0 following the procedure. Post Debridement Measurements: 12.4cm length x 8.6cm width x 2.2cm depth; 184.261cm^3 volume. Post debridement Stage noted as Category/Stage III. Character of Wound/Ulcer Post Debridement is improved. Severity of Tissue Post Debridement  is: Fat layer exposed. Post procedure Diagnosis Wound #5: Same as Pre-Procedure Plan Wound Cleansing: Wound #5 Sacrum: Clean wound with Normal Saline. May Shower, gently pat wound dry prior to applying new dressing. Anesthetic: Wound #5 Sacrum: Topical Lidocaine 4% cream applied to wound bed prior to debridement Skin Barriers/Peri-Wound Care: Wound #5 Sacrum: Skin Prep Primary Wound Dressing: Wound #5 Sacrum: Aquacel Ag - please get aquacel ag brand - not generic and please order appropriate amount of this product for patient to change dressing daily Secondary Dressing: Wound #5 Sacrum: ABD pad Dry Gauze XtraSorb - HHRN to please provide this for patient Dressing Change Frequency: Wound #5 Sacrum: Change dressing every day. Follow-up Appointments: Wound #5 Sacrum: Return Appointment in 2 weeks. Off-Loading: Wound #5 Sacrum: Bradley [name], Bradley [name] ([id number]) Roho cushion for wheelchair Mattress - continue air mattress Turn and reposition every 2 hours Additional Orders / Instructions: Wound #5 Sacrum: Increase protein intake. Home Health: Wound #5 Sacrum: [place] Visits - HHRN to visit patient 2 times weekly. HHRN to provide dressing materials for patient to allow him to change dressing daily and as needed Home Health Nurse may visit PRN to address patient s wound care needs. FACE TO FACE ENCOUNTER: MEDICARE and MEDICAID PATIENTS: I certify that this patient is under my care and that I had a face-to-face encounter that meets the physician face-to-face encounter requirements with this patient on this date. The encounter with the patient was in whole or in part for the following MEDICAL CONDITION: (primary reason for [place]) MEDICAL NECESSITY: I certify, that based on my findings, NURSING services are a medically necessary home health service. HOME BOUND STATUS: I certify that my clinical findings support that this patient is homebound (i.e., Due  to illness or injury, pt requires aid of supportive devices such as crutches, cane, wheelchairs, walkers, the use of special transportation or the assistance of another person to leave their place of residence. There is a normal inability to leave the home and doing so requires considerable and taxing effort. Other absences are for medical reasons / religious services and are infrequent or of short duration when for other reasons). If current dressing causes regression in wound condition, may D/C ordered dressing product/s and apply Normal Saline Moist Dressing daily until next [place] / Other MD appointment. [place] of regression in wound condition at [phone]. Please direct any NON-WOUND related issues/requests for orders to patient's Primary Care Physician #1 I elected to perform an aggressive debridement on this wound today because of lack of progression. I removed nonviable tissue skin and subcutaneous tissue from the circumference of the wound. Also an aggressive debridement of the wound surface. In terms of the surface there is certainly is surface slough here and some nonviable subcutaneous tissue although this does not appear to be the major issue. #2 I had wanted to change the patient the [facility] although I don't believe that we will be able to get enough of this to change the dressing twice a day. We'll contact the home health nurses to see whether they know anything about the degree of  drainage. For now we continued with the Aquacel Ag which is unchanged. #3 I see no evidence of infection here. This could be an offloading issue also Electronic Signature(s) Signed: 05/25/2016 11:47:56 AM By: Gretta Cool RN, BSN, Kim RN, BSN Signed: 05/25/2016 5:40:41 PM By: Linton Ham MD Previous Signature: 05/24/2016 5:07:10 PM Version By: Linton Ham MD Entered By: Gretta Cool RN, BSN, Kim on 05/25/2016 11:47:55 Bradley [name], Bradley [name] ([id number]) Bradley [name], Bradley [name] ([id number]) -------------------------------------------------------------------------------- [place] Details Patient Name: MILEN, TREECE A. Date of Service: 05/24/2016 Medical Record Patient Account Number: [id number] Number: Treating RN: Montey Hora 08-20-1945 (70 y.o. Other Clinician: Date of Birth/Sex: Male) Treating Takeila Thayne, [place] Primary Care Physician: [name] Physician/Extender: G Referring Physician: Abelardo Diesel in Treatment: 12 Diagnosis Coding ICD-10 Codes Code Description L89.153 Pressure ulcer of sacral region, stage 3 G82.21 Paraplegia, complete Facility Procedures CPT4 Code: [id number] Description: 11042 - DEB SUBQ TISSUE 20 SQ CM/< ICD-10 Description Diagnosis L89.153 Pressure ulcer of sacral region, stage 3 G82.21 Paraplegia, complete Modifier: Quantity: 1 CPT4 Code: [id number] Description: [id number] - DEB SUBQ TISS EA ADDL 20CM ICD-10 Description Diagnosis L89.153 Pressure ulcer of sacral region, stage 3 G82.21 Paraplegia, complete Modifier: Quantity: 5 Physician Procedures CPT4 Code: [id number] Description: 11042 - WC PHYS SUBQ TISS 20 SQ CM ICD-10 Description Diagnosis L89.153 Pressure ulcer of sacral region, stage 3 G82.21 Paraplegia, complete Modifier: Quantity: 1 CPT4 Code: [id number] Taborn, Gurtaj A Description: [id number] - WC PHYS SUBQ TISS EA ADDL 20 CM ICD-10 Description Diagnosis L89.153 Pressure ulcer of sacral region, stage 3 G82.21 Paraplegia, complete . ([id number]) Modifier: Quantity: 5 Electronic Signature(s) Signed: 05/24/2016 5:07:10 PM By: Linton Ham MD Entered By: Linton Ham on 05/24/2016 11:43:02

## 2016-06-01 DIAGNOSIS — I10 Essential (primary) hypertension: Secondary | ICD-10-CM | POA: Diagnosis not present

## 2016-06-01 DIAGNOSIS — Z87828 Personal history of other (healed) physical injury and trauma: Secondary | ICD-10-CM | POA: Diagnosis not present

## 2016-06-01 DIAGNOSIS — L89153 Pressure ulcer of sacral region, stage 3: Secondary | ICD-10-CM | POA: Diagnosis not present

## 2016-06-01 DIAGNOSIS — Z4800 Encounter for change or removal of nonsurgical wound dressing: Secondary | ICD-10-CM | POA: Diagnosis not present

## 2016-06-01 DIAGNOSIS — M069 Rheumatoid arthritis, unspecified: Secondary | ICD-10-CM | POA: Diagnosis not present

## 2016-06-01 DIAGNOSIS — G8221 Paraplegia, complete: Secondary | ICD-10-CM | POA: Diagnosis not present

## 2016-06-03 DIAGNOSIS — Z87828 Personal history of other (healed) physical injury and trauma: Secondary | ICD-10-CM | POA: Diagnosis not present

## 2016-06-03 DIAGNOSIS — I10 Essential (primary) hypertension: Secondary | ICD-10-CM | POA: Diagnosis not present

## 2016-06-03 DIAGNOSIS — M069 Rheumatoid arthritis, unspecified: Secondary | ICD-10-CM | POA: Diagnosis not present

## 2016-06-03 DIAGNOSIS — L89153 Pressure ulcer of sacral region, stage 3: Secondary | ICD-10-CM | POA: Diagnosis not present

## 2016-06-03 DIAGNOSIS — G8221 Paraplegia, complete: Secondary | ICD-10-CM | POA: Diagnosis not present

## 2016-06-03 DIAGNOSIS — Z4800 Encounter for change or removal of nonsurgical wound dressing: Secondary | ICD-10-CM | POA: Diagnosis not present

## 2016-06-08 DIAGNOSIS — M069 Rheumatoid arthritis, unspecified: Secondary | ICD-10-CM | POA: Diagnosis not present

## 2016-06-08 DIAGNOSIS — Z87828 Personal history of other (healed) physical injury and trauma: Secondary | ICD-10-CM | POA: Diagnosis not present

## 2016-06-08 DIAGNOSIS — Z4800 Encounter for change or removal of nonsurgical wound dressing: Secondary | ICD-10-CM | POA: Diagnosis not present

## 2016-06-08 DIAGNOSIS — L8943 Pressure ulcer of contiguous site of back, buttock and hip, stage 3: Secondary | ICD-10-CM | POA: Diagnosis not present

## 2016-06-08 DIAGNOSIS — G8221 Paraplegia, complete: Secondary | ICD-10-CM | POA: Diagnosis not present

## 2016-06-08 DIAGNOSIS — I10 Essential (primary) hypertension: Secondary | ICD-10-CM | POA: Diagnosis not present

## 2016-06-08 DIAGNOSIS — L8915 Pressure ulcer of sacral region: Secondary | ICD-10-CM | POA: Diagnosis not present

## 2016-06-08 DIAGNOSIS — L89153 Pressure ulcer of sacral region, stage 3: Secondary | ICD-10-CM | POA: Diagnosis not present

## 2016-06-10 DIAGNOSIS — M069 Rheumatoid arthritis, unspecified: Secondary | ICD-10-CM | POA: Diagnosis not present

## 2016-06-10 DIAGNOSIS — I10 Essential (primary) hypertension: Secondary | ICD-10-CM | POA: Diagnosis not present

## 2016-06-10 DIAGNOSIS — Z4800 Encounter for change or removal of nonsurgical wound dressing: Secondary | ICD-10-CM | POA: Diagnosis not present

## 2016-06-10 DIAGNOSIS — G8221 Paraplegia, complete: Secondary | ICD-10-CM | POA: Diagnosis not present

## 2016-06-10 DIAGNOSIS — L8943 Pressure ulcer of contiguous site of back, buttock and hip, stage 3: Secondary | ICD-10-CM | POA: Diagnosis not present

## 2016-06-10 DIAGNOSIS — Z87828 Personal history of other (healed) physical injury and trauma: Secondary | ICD-10-CM | POA: Diagnosis not present

## 2016-06-13 DIAGNOSIS — G8221 Paraplegia, complete: Secondary | ICD-10-CM | POA: Diagnosis not present

## 2016-06-13 DIAGNOSIS — Z4800 Encounter for change or removal of nonsurgical wound dressing: Secondary | ICD-10-CM | POA: Diagnosis not present

## 2016-06-13 DIAGNOSIS — M069 Rheumatoid arthritis, unspecified: Secondary | ICD-10-CM | POA: Diagnosis not present

## 2016-06-13 DIAGNOSIS — I10 Essential (primary) hypertension: Secondary | ICD-10-CM | POA: Diagnosis not present

## 2016-06-13 DIAGNOSIS — L8943 Pressure ulcer of contiguous site of back, buttock and hip, stage 3: Secondary | ICD-10-CM | POA: Diagnosis not present

## 2016-06-13 DIAGNOSIS — Z87828 Personal history of other (healed) physical injury and trauma: Secondary | ICD-10-CM | POA: Diagnosis not present

## 2016-06-14 ENCOUNTER — Encounter: Payer: Medicare HMO | Attending: Internal Medicine | Admitting: Internal Medicine

## 2016-06-14 DIAGNOSIS — L89153 Pressure ulcer of sacral region, stage 3: Secondary | ICD-10-CM | POA: Insufficient documentation

## 2016-06-14 DIAGNOSIS — G8221 Paraplegia, complete: Secondary | ICD-10-CM | POA: Insufficient documentation

## 2016-06-14 DIAGNOSIS — M069 Rheumatoid arthritis, unspecified: Secondary | ICD-10-CM | POA: Insufficient documentation

## 2016-06-14 DIAGNOSIS — I10 Essential (primary) hypertension: Secondary | ICD-10-CM | POA: Diagnosis not present

## 2016-06-15 DIAGNOSIS — G8221 Paraplegia, complete: Secondary | ICD-10-CM | POA: Diagnosis not present

## 2016-06-15 DIAGNOSIS — L8943 Pressure ulcer of contiguous site of back, buttock and hip, stage 3: Secondary | ICD-10-CM | POA: Diagnosis not present

## 2016-06-15 DIAGNOSIS — Z87828 Personal history of other (healed) physical injury and trauma: Secondary | ICD-10-CM | POA: Diagnosis not present

## 2016-06-15 DIAGNOSIS — Z4800 Encounter for change or removal of nonsurgical wound dressing: Secondary | ICD-10-CM | POA: Diagnosis not present

## 2016-06-15 DIAGNOSIS — I10 Essential (primary) hypertension: Secondary | ICD-10-CM | POA: Diagnosis not present

## 2016-06-15 DIAGNOSIS — M069 Rheumatoid arthritis, unspecified: Secondary | ICD-10-CM | POA: Diagnosis not present

## 2016-06-15 NOTE — Progress Notes (Signed)
JERRETT, DURLING ([id number]) Visit Report for 06/14/2016 Arrival Information Details Patient Name: Bradley [name], Bradley [name]. Date of Service: 06/14/2016 10:00 AM Medical Record Number: [id number] Patient Account Number: [id number] Date of Birth/Sex: 05/27/1946 (71 y.o. Male) Treating RN: Montey Hora Primary Care Physician: [name] Other Clinician: Referring Physician: [name] Treating Physician/Extender: Tito Dine in Treatment: 15 Visit Information History Since Last Visit Added or deleted any medications: No Patient Arrived: Wheel Chair Any new allergies or adverse reactions: No Arrival Time: 10:10 Had a fall or experienced change in No activities of daily living that may affect Accompanied By: self risk of falls: Transfer Assistance: Hoyer Lift Signs or symptoms of abuse/neglect since last No Patient Identification Verified: Yes visito Secondary Verification Process Yes Hospitalized since last visit: No Completed: Pain Present Now: No Electronic Signature(s) Signed: 06/14/2016 12:30:27 PM By: Montey Hora Entered By: Montey Hora on 06/14/2016 10:11:13 [name] -------------------------------------------------------------------------------- Encounter Discharge Information Details Patient Name: Bradley Loa A. Date of Service: 06/14/2016 10:00 AM Medical Record Number: [id number] Patient Account Number: [id number] Date of Birth/Sex: [birth year] (71 y.o. Male) Treating RN: Montey Hora Primary Care Physician: [name] Other Clinician: Referring Physician: [name] Treating Physician/Extender: Tito Dine in Treatment: 15 Encounter Discharge Information Items Discharge Pain Level: 0 Discharge Condition: Stable Ambulatory Status: Wheelchair Discharge Destination: Home Transportation: Private Auto Accompanied By: self Schedule Follow-up Appointment: Yes Medication Reconciliation  completed No and provided to Patient/Care Bradley [name]: Provided on Clinical Summary of Care: 06/14/2016 Form Type Recipient Paper Patient JG Electronic Signature(s) Signed: 06/14/2016 12:10:45 PM By: Montey Hora Previous Signature: 06/14/2016 11:14:11 AM Version By: Ruthine Dose Entered By: Montey Hora on 06/14/2016 12:10:45 [name] -------------------------------------------------------------------------------- Multi Wound Chart Details Patient Name: Bradley Loa A. Date of Service: 06/14/2016 10:00 AM Medical Record Number: [id number] Patient Account Number: [id number] Date of Birth/Sex: [birth year] (71 y.o. Male) Treating RN: Montey Hora Primary Care Physician: [name] Other Clinician: Referring Physician: [name] Treating Physician/Extender: Ricard Dillon Weeks in Treatment: 15 Vital Signs Height(in): Pulse(bpm): 85 Weight(lbs): Blood Pressure 99/39 (mmHg): Body Mass Index(BMI): Temperature(F): 98.3 Respiratory Rate 18 (breaths/min): Photos: [N/A:N/A] Wound Location: Sacrum N/A N/A Wounding Event: Pressure Injury N/A N/A Primary Etiology: Pressure Ulcer N/A N/A Comorbid History: Hypertension, N/A N/A Rheumatoid Arthritis, Paraplegia Date Acquired: 11/05/2014 N/A N/A Weeks of Treatment: 15 N/A N/A Wound Status: Open N/A N/A Measurements L x W x D 13x9x2 N/A N/A (cm) Area (cm) : 91.892 N/A N/A Volume (cm) : 183.783 N/A N/A % Reduction in Area: -33.70% N/A N/A % Reduction in Volume: -11.40% N/A N/A Classification: Category/Stage III N/A N/A Exudate Amount: Large N/A N/A Exudate Type: Serous N/A N/A Exudate Color: amber N/A N/A Wound Margin: Thickened N/A N/A Granulation Amount: Large (67-100%) N/A N/A Granulation Quality: Red N/A N/A Necrotic Amount: Small (1-33%) N/A N/A Bradley [name], Bradley A. ([id number]) Exposed Structures: Fat: Yes N/A N/A Fascia: No Tendon: No Muscle: No Joint: No Bone:  No Epithelialization: None N/A N/A Debridement: Debridement [id number] N/A N/A 11047) Pre-procedure 10:36 N/A N/A Verification/Time Out Taken: Pain Control: Lidocaine 4% Topical N/A N/A Solution Tissue Debrided: Skin, Subcutaneous N/A N/A Level: Skin/Subcutaneous N/A N/A Tissue Debridement Area (sq 117 N/A N/A cm): Instrument: Blade, Curette, Forceps N/A N/A Bleeding: Moderate N/A N/A Hemostasis Achieved: Pressure N/A N/A Procedural Pain: 0 N/A N/A Post Procedural Pain: 0 N/A N/A Debridement Treatment Procedure was tolerated N/A N/A Response: well Post Debridement 13x9x2 N/A N/A Measurements L x W x D (cm) Post  Debridement 183.783 N/A N/A Volume: (cm) Post Debridement Category/Stage III N/A N/A Stage: Periwound Skin Texture: Edema: No N/A N/A Excoriation: No Induration: No Callus: No Crepitus: No Fluctuance: No Friable: No Rash: No Scarring: No Periwound Skin Maceration: Yes N/A N/A Moisture: Moist: Yes Dry/Scaly: No Periwound Skin Color: Atrophie Blanche: No N/A N/A Cyanosis: No Ecchymosis: No Erythema: No Hemosiderin Staining: No Bradley [name], Bradley A. ([id number]) Mottled: No Pallor: No Rubor: No Temperature: No Abnormality N/A N/A Tenderness on No N/A N/A Palpation: Wound Preparation: Ulcer Cleansing: N/A N/A Rinsed/Irrigated with Saline Topical Anesthetic Applied: Other: lidocaine 4% Procedures Performed: Debridement N/A N/A Treatment Notes Wound #5 (Sacrum) 1. Cleansed with: Clean wound with Normal Saline 4. Dressing Applied: Hydrafera Blue Other dressing (specify in notes) 5. Secondary Dressing Applied ABD Pad 7. Secured with Tape Notes Manufacturing systems engineer) Signed: 06/14/2016 4:24:12 PM By: Linton Ham MD Previous Signature: 06/14/2016 12:30:27 PM Version By: Montey Hora Entered By: Linton Ham on 06/14/2016 13:39:46 [name]  ([id number]) -------------------------------------------------------------------------------- [place] Details Patient Name: Bradley [name], Bradley A. Date of Service: 06/14/2016 10:00 AM Medical Record Number: [id number] Patient Account Number: [id number] Date of Birth/Sex: May 06, 1946 (71 y.o. Male) Treating RN: Montey Hora Primary Care Physician: [name] Other Clinician: Referring Physician: [name] Treating Physician/Extender: Tito Dine in Treatment: 15 Active Inactive Abuse / Safety / Falls / Self Care Management Nursing Diagnoses: Impaired physical mobility Potential for falls Goals: Patient will remain injury free Date Initiated: 03/01/2016 Goal Status: Active Interventions: Assess fall risk on admission and as needed Notes: Pressure Nursing Diagnoses: Knowledge deficit related to causes and risk factors for pressure ulcer development Goals: Patient will remain free from development of additional pressure ulcers Date Initiated: 03/01/2016 Goal Status: Active Interventions: Provide education on pressure ulcers Notes: Wound/Skin Impairment Nursing Diagnoses: Impaired tissue integrity Goals: Patient/caregiver will verbalize understanding of skin care regimen Bradley [name], Bradley [name] ([id number]) Date Initiated: 03/01/2016 Goal Status: Active Ulcer/skin breakdown will have a volume reduction of 30% by week 4 Date Initiated: 03/01/2016 Goal Status: Active Ulcer/skin breakdown will have a volume reduction of 50% by week 8 Date Initiated: 03/01/2016 Goal Status: Active Ulcer/skin breakdown will have a volume reduction of 80% by week 12 Date Initiated: 03/01/2016 Goal Status: Active Ulcer/skin breakdown will heal within 14 weeks Date Initiated: 03/01/2016 Goal Status: Active Interventions: Assess patient/caregiver ability to obtain necessary supplies Assess patient/caregiver ability to perform ulcer/skin care regimen upon admission  and as needed Assess ulceration(s) every visit Notes: Electronic Signature(s) Signed: 06/14/2016 12:30:27 PM By: Montey Hora Entered By: Montey Hora on 06/14/2016 10:36:27 [name] -------------------------------------------------------------------------------- Pain Assessment Details Patient Name: Bradley Loa A. Date of Service: 06/14/2016 10:00 AM Medical Record Number: [id number] Patient Account Number: [id number] Date of Birth/Sex: 06/14/1945 (71 y.o. Male) Treating RN: Montey Hora Primary Care Physician: [name] Other Clinician: Referring Physician: [name] Treating Physician/Extender: Tito Dine in Treatment: 15 Active Problems Location of Pain Severity and Description of Pain Patient Has Paino No Site Locations Pain Management and Medication Current Pain Management: Notes Topical or injectable lidocaine is offered to patient for acute pain when surgical debridement is performed. If needed, Patient is instructed to use over the counter pain medication for the following 24-48 hours after debridement. Wound care MDs do not prescribed pain medications. Patient has chronic pain or uncontrolled pain. Patient has been instructed to make an appointment with their Primary Care Physician for pain management. Electronic Signature(s) Signed: 06/14/2016 12:30:27 PM By: Montey Hora Entered  By: Montey Hora on 06/14/2016 10:11:26 [name] -------------------------------------------------------------------------------- Patient/Caregiver Education Details Patient Name: Bradley Loa A. Date of Service: 06/14/2016 10:00 AM Medical Record Number: [id number] Patient Account Number: [id number] Date of Birth/Gender: [birth year] (71 y.o. Male) Treating RN: Montey Hora Primary Care Physician: [name] Other Clinician: Referring Physician: [name] Treating Physician/Extender: Tito Dine in Treatment: 15 Education Assessment Education Provided To: Patient Education Topics Provided Wound/Skin Impairment: Handouts: Other: wound care as ordered Methods: Demonstration, Explain/Verbal Responses: State content correctly Electronic Signature(s) Signed: 06/14/2016 12:30:27 PM By: Montey Hora Entered By: Montey Hora on 06/14/2016 12:11:12 [name] -------------------------------------------------------------------------------- Wound Assessment Details Patient Name: Bradley Loa A. Date of Service: 06/14/2016 10:00 AM Medical Record Number: [id number] Patient Account Number: [id number] Date of Birth/Sex: 09-Dec-1945 (71 y.o. Male) Treating RN: Montey Hora Primary Care Physician: [name] Other Clinician: Referring Physician: [name] Treating Physician/Extender: Ricard Dillon Weeks in Treatment: 15 Wound Status Wound Number: 5 Primary Pressure Ulcer Etiology: Wound Location: Sacrum Wound Status: Open Wounding Event: Pressure Injury Comorbid Hypertension, Rheumatoid Arthritis, Date Acquired: 11/05/2014 History: Paraplegia Weeks Of Treatment: 15 Clustered Wound: No Photos Wound Measurements Length: (cm) 13 Width: (cm) 9 Depth: (cm) 2 Area: (cm) 91.892 Volume: (cm) 183.783 % Reduction in Area: -33.7% % Reduction in Volume: -11.4% Epithelialization: None Tunneling: No Undermining: No Wound Description Classification: Category/Stage III Wound Margin: Thickened Exudate Amount: Large Exudate Type: Serous Exudate Color: amber Foul Odor After Cleansing: No Wound Bed Granulation Amount: Large (67-100%) Exposed Structure Granulation Quality: Red Fascia Exposed: No Necrotic Amount: Small (1-33%) Fat Layer Exposed: Yes Necrotic Quality: Adherent Slough Tendon Exposed: No Muscle Exposed: No Bradley [name], Bradley A. ([id number]) Joint Exposed: No Bone Exposed: No Periwound Skin Texture Texture  Color No Abnormalities Noted: No No Abnormalities Noted: No Callus: No Atrophie Blanche: No Crepitus: No Cyanosis: No Excoriation: No Ecchymosis: No Fluctuance: No Erythema: No Friable: No Hemosiderin Staining: No Induration: No Mottled: No Localized Edema: No Pallor: No Rash: No Rubor: No Scarring: No Temperature / Pain Moisture Temperature: No Abnormality No Abnormalities Noted: No Dry / Scaly: No Maceration: Yes Moist: Yes Wound Preparation Ulcer Cleansing: Rinsed/Irrigated with Saline Topical Anesthetic Applied: Other: lidocaine 4%, Treatment Notes Wound #5 (Sacrum) 1. Cleansed with: Clean wound with Normal Saline 4. Dressing Applied: Hydrafera Blue Other dressing (specify in notes) 5. Secondary Dressing Applied ABD Pad 7. Secured with Tape Notes Manufacturing systems engineer) Signed: 06/14/2016 12:30:27 PM By: Montey Hora Signed: 06/15/2016 2:01:17 PM By: Gretta Cool, RN, BSN, Kim RN, BSN Entered By: Gretta Cool, RN, BSN, Kim on 06/14/2016 11:42:25 [name] -------------------------------------------------------------------------------- [place] Details Patient Name: Bradley [name], Bradley A. Date of Service: 06/14/2016 10:00 AM Medical Record Number: [id number] Patient Account Number: [id number] Date of Birth/Sex: [birth year] (71 y.o. Male) Treating RN: Montey Hora Primary Care Physician: [name] Other Clinician: Referring Physician: [name] Treating Physician/Extender: Ricard Dillon Weeks in Treatment: 15 Vital Signs Time Taken: 10:11 Temperature (F): 98.3 Pulse (bpm): 85 Respiratory Rate (breaths/min): 18 Blood Pressure (mmHg): 99/39 Reference Range: 80 - 120 mg / dl Electronic Signature(s) Signed: 06/14/2016 12:30:27 PM By: Montey Hora Entered By: Montey Hora on 06/14/2016 10:19:31

## 2016-06-16 NOTE — Progress Notes (Signed)
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 change to [facility] but I'll need to have a sense of how frequent he would need to change this. 06/14/16; this is a patient returns for review of his stage III sacral pressure ulcer. We have been using Aquacel Ag and over the last 2 visits he has had extensive debridement so of the thick circumferential skin and Spooner, Sadarius A. ([id number]) subcutaneous tissue that surrounds this wound. In spite of this really absolutely no change in the condition of the wound warrants measurements. We have [facility] home [facility] I believe changing the dressing on 3 occasions the patient states he does this on one occasion himself Objective Constitutional Patient is hypertensive.. Pulse regular and within target range for patient.Marland Kitchen Respirations regular, non-labored and within target range.. Temperature is normal and within  the target range for the patient.. Patient's appearance is neat and clean. Appears in no acute distress. Well nourished and well developed.. Vitals Time Taken: 10:11 AM, Temperature: 98.3 F, Pulse: 85 bpm, Respiratory Rate: 18 breaths/min, Blood Pressure: 99/39 mmHg. General Notes: Wound exam; this is a fairly substantial wound over his sacrum lower coccyx with the tip of this at the lower coccyx level. Large stage III wound. The surface of this looks reasonably healthy however I have continue the process of debridement and the thick circumference of this wound. Using pickups and a blade I removed skin and subcutaneous tissue including macerated tissue around the wound surface. I then used an open curet to debridement this surface of the wound itself wondering whether there was excessive "bioburden" Integumentary (Hair, Skin) Wound #5 status is Open. Original cause of wound was Pressure Injury. The wound is located on the Sacrum. The wound measures 13cm length x 9cm width x 2cm depth; 91.892cm^2 area and 183.783cm^3 volume. There is fat exposed. There is no tunneling or undermining noted. There is a large amount of serous drainage noted. The wound margin is thickened. There is large (67-100%) red granulation within the wound bed. There is a small (1-33%) amount of necrotic tissue  within the wound bed including Adherent Slough. The periwound skin appearance exhibited: Maceration, Moist. The periwound skin appearance did not exhibit: Callus, Crepitus, Excoriation, Fluctuance, Friable, Induration, Localized Edema, Rash, Scarring, Dry/Scaly, Atrophie Blanche, Cyanosis, Ecchymosis, Hemosiderin Staining, Mottled, Pallor, Rubor, Erythema. Periwound temperature was noted as No Abnormality. Assessment Active Problems ICD-10 L89.153 - Pressure ulcer of sacral region, stage 3 G82.21 - Paraplegia, complete ABDURREHMAN, RAPSON A. ([id number]) Procedures Wound #5 Wound #5 is a Pressure Ulcer located on the  Sacrum . There was a Skin/Subcutaneous Tissue Debridement [id number]) debridement with total area of 117 sq cm performed by Ricard Dillon, MD. with the following instrument(s): Blade, Curette, and Forceps to remove Viable and Non-Viable tissue/material including Skin and Subcutaneous after achieving pain control using Lidocaine 4% Topical Solution. A time out was conducted at 10:36, prior to the start of the procedure. A Moderate amount of bleeding was controlled with Pressure. The procedure was tolerated well with a pain level of 0 throughout and a pain level of 0 following the procedure. Post Debridement Measurements: 13cm length x 9cm width x 2cm depth; 183.783cm^3 volume. Post debridement Stage noted as Category/Stage III. Character of Wound/Ulcer Post Debridement is improved. Severity of Tissue Post Debridement is: Fat layer exposed. Post procedure Diagnosis Wound #5: Same as Pre-Procedure Plan Wound Cleansing: Wound #5 Sacrum: Clean wound with Normal Saline. May Shower, gently pat wound dry prior to applying new dressing. Anesthetic: Wound #5 Sacrum: Topical Lidocaine 4% cream applied to wound bed prior to debridement Skin Barriers/Peri-Wound Care: Wound #5 Sacrum: Skin Prep Primary Wound Dressing: Wound #5 Sacrum: Hydrafera Blue - Please provide hydrofera blue ready transfer for patient as patient is unable to cut to fit the dressing to his wound and this product does not have to be cut to fit. Also please provide supplies for daily dressing changes d/t drainage amount Secondary Dressing: Wound #5 Sacrum: ABD pad Dry Gauze XtraSorb - HHRN to please provide this for patient Dressing Change Frequency: Wound #5 SacrumDEQUINCY, CLEMENSEN A. ([id number]) Change dressing every day. Follow-up Appointments: Wound #5 Sacrum: Return Appointment in 2 weeks. Off-Loading: Wound #5 Sacrum: Roho cushion for wheelchair Mattress - continue air mattress Turn and reposition every 2  hours Additional Orders / Instructions: Wound #5 Sacrum: Increase protein intake. Home Health: Wound #5 Sacrum: [place] Visits - HHRN to visit patient 2 times weekly. HHRN to provide dressing materials for patient to allow him to change dressing daily and as needed Home Health Nurse may visit PRN to address patient s wound care needs. FACE TO FACE ENCOUNTER: MEDICARE and MEDICAID PATIENTS: I certify that this patient is under my care and that I had a face-to-face encounter that meets the physician face-to-face encounter requirements with this patient on this date. The encounter with the patient was in whole or in part for the following MEDICAL CONDITION: (primary reason for [place]) MEDICAL NECESSITY: I certify, that based on my findings, NURSING services are a medically necessary home health service. HOME BOUND STATUS: I certify that my clinical findings support that this patient is homebound (i.e., Due to illness or injury, pt requires aid of supportive devices such as crutches, cane, wheelchairs, walkers, the use of special transportation or the assistance of another person to leave their place of residence. There is a normal inability to leave the home and doing so requires considerable and taxing effort. Other absences are for medical reasons / religious services and are infrequent or of short duration when for other reasons).  If current dressing causes regression in wound condition, may D/C ordered dressing product/s and apply Normal Saline Moist Dressing daily until next [place] / Other MD appointment. [place] of regression in wound condition at [phone]. Please direct any NON-WOUND related issues/requests for orders to patient's Primary Care Physician change primary dressing to hydrofera from [place] Signature(s) Signed: 06/14/2016 4:24:12 PM By: Linton Ham MD Entered By: Linton Ham on 06/14/2016  13:46:12 [name] -------------------------------------------------------------------------------- [place] Details Patient Name: Bradley Loa A. Date of Service: 06/14/2016 Medical Record Patient Account Number: [id number] Number: Treating RN: Montey Hora 1946-04-22 (70 y.o. Other Clinician: Date of Birth/Sex: Male) Treating Alyssamarie Mounsey, [place] Primary Care Physician: [name] Physician/Extender: G Referring Physician: Abelardo Diesel in Treatment: 15 Diagnosis Coding ICD-10 Codes Code Description L89.153 Pressure ulcer of sacral region, stage 3 G82.21 Paraplegia, complete Facility Procedures CPT4 Code: [id number] Description: 11042 - DEB SUBQ TISSUE 20 SQ CM/< ICD-10 Description Diagnosis L89.153 Pressure ulcer of sacral region, stage 3 Modifier: Quantity: 1 CPT4 Code: [id number] Description: 11045 - DEB SUBQ TISS EA ADDL 20CM ICD-10 Description Diagnosis L89.153 Pressure ulcer of sacral region, stage 3 Modifier: Quantity: 3 Physician Procedures CPT4 Code: [id number] Description: 11042 - WC PHYS SUBQ TISS 20 SQ CM ICD-10 Description Diagnosis L89.153 Pressure ulcer of sacral region, stage 3 Modifier: Quantity: 1 CPT4 Code: [id number] Description: [id number] - WC PHYS SUBQ TISS EA ADDL 20 CM ICD-10 Description Diagnosis L89.153 Pressure ulcer of sacral region, stage 3 Modifier: Quantity: 3 Electronic Signature(s) Signed: 06/14/2016 4:24:12 PM By: Linton Ham MD Entered By: Linton Ham on 06/14/2016 13:46:58 [name]

## 2016-06-17 DIAGNOSIS — Z87828 Personal history of other (healed) physical injury and trauma: Secondary | ICD-10-CM | POA: Diagnosis not present

## 2016-06-17 DIAGNOSIS — M069 Rheumatoid arthritis, unspecified: Secondary | ICD-10-CM | POA: Diagnosis not present

## 2016-06-17 DIAGNOSIS — L8943 Pressure ulcer of contiguous site of back, buttock and hip, stage 3: Secondary | ICD-10-CM | POA: Diagnosis not present

## 2016-06-17 DIAGNOSIS — Z4800 Encounter for change or removal of nonsurgical wound dressing: Secondary | ICD-10-CM | POA: Diagnosis not present

## 2016-06-17 DIAGNOSIS — I10 Essential (primary) hypertension: Secondary | ICD-10-CM | POA: Diagnosis not present

## 2016-06-17 DIAGNOSIS — G8221 Paraplegia, complete: Secondary | ICD-10-CM | POA: Diagnosis not present

## 2016-06-24 DIAGNOSIS — M069 Rheumatoid arthritis, unspecified: Secondary | ICD-10-CM | POA: Diagnosis not present

## 2016-06-24 DIAGNOSIS — G8221 Paraplegia, complete: Secondary | ICD-10-CM | POA: Diagnosis not present

## 2016-06-24 DIAGNOSIS — Z4800 Encounter for change or removal of nonsurgical wound dressing: Secondary | ICD-10-CM | POA: Diagnosis not present

## 2016-06-24 DIAGNOSIS — I10 Essential (primary) hypertension: Secondary | ICD-10-CM | POA: Diagnosis not present

## 2016-06-24 DIAGNOSIS — L8943 Pressure ulcer of contiguous site of back, buttock and hip, stage 3: Secondary | ICD-10-CM | POA: Diagnosis not present

## 2016-06-24 DIAGNOSIS — Z87828 Personal history of other (healed) physical injury and trauma: Secondary | ICD-10-CM | POA: Diagnosis not present

## 2016-06-27 DIAGNOSIS — M069 Rheumatoid arthritis, unspecified: Secondary | ICD-10-CM | POA: Diagnosis not present

## 2016-06-27 DIAGNOSIS — Z87828 Personal history of other (healed) physical injury and trauma: Secondary | ICD-10-CM | POA: Diagnosis not present

## 2016-06-27 DIAGNOSIS — Z4800 Encounter for change or removal of nonsurgical wound dressing: Secondary | ICD-10-CM | POA: Diagnosis not present

## 2016-06-27 DIAGNOSIS — G8221 Paraplegia, complete: Secondary | ICD-10-CM | POA: Diagnosis not present

## 2016-06-27 DIAGNOSIS — L8943 Pressure ulcer of contiguous site of back, buttock and hip, stage 3: Secondary | ICD-10-CM | POA: Diagnosis not present

## 2016-06-27 DIAGNOSIS — I10 Essential (primary) hypertension: Secondary | ICD-10-CM | POA: Diagnosis not present

## 2016-06-28 ENCOUNTER — Encounter: Payer: Medicare HMO | Admitting: Internal Medicine

## 2016-06-28 DIAGNOSIS — I10 Essential (primary) hypertension: Secondary | ICD-10-CM | POA: Diagnosis not present

## 2016-06-28 DIAGNOSIS — M069 Rheumatoid arthritis, unspecified: Secondary | ICD-10-CM | POA: Diagnosis not present

## 2016-06-28 DIAGNOSIS — G8221 Paraplegia, complete: Secondary | ICD-10-CM | POA: Diagnosis not present

## 2016-06-28 DIAGNOSIS — L89153 Pressure ulcer of sacral region, stage 3: Secondary | ICD-10-CM | POA: Diagnosis not present

## 2016-06-29 DIAGNOSIS — Z4800 Encounter for change or removal of nonsurgical wound dressing: Secondary | ICD-10-CM | POA: Diagnosis not present

## 2016-06-29 DIAGNOSIS — L8943 Pressure ulcer of contiguous site of back, buttock and hip, stage 3: Secondary | ICD-10-CM | POA: Diagnosis not present

## 2016-06-29 DIAGNOSIS — Z87828 Personal history of other (healed) physical injury and trauma: Secondary | ICD-10-CM | POA: Diagnosis not present

## 2016-06-29 DIAGNOSIS — I10 Essential (primary) hypertension: Secondary | ICD-10-CM | POA: Diagnosis not present

## 2016-06-29 DIAGNOSIS — G8221 Paraplegia, complete: Secondary | ICD-10-CM | POA: Diagnosis not present

## 2016-06-29 DIAGNOSIS — M069 Rheumatoid arthritis, unspecified: Secondary | ICD-10-CM | POA: Diagnosis not present

## 2016-06-29 NOTE — Progress Notes (Addendum)
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 follow-up. In the meantime [facility] from [facility] has calledrequesting ordering of a wound VAC. He had this discussion before. The problem is the proximity of the lowest edge of this wound to the patient's anal opening roughly 3/4 of an inch. Can't see how this can be arranged. Apparently the nurse who is calling has a lot of experience, the question would be then when she is not available would be doing this. I would not have thought that this wound is not amenable to a wound VAC because of this reason Objective Constitutional Sitting or standing Blood Pressure is within target range for patient.. Pulse regular and within target range for patient.Marland Kitchen Respirations regular, non-labored and within target range.. Temperature is normal and within the target range for the patient.. Patient's appearance is neat and clean. Appears in no acute distress.  Well nourished and well developed.. Vitals Time Taken: 9:51 AM, Pulse: 73 bpm, Respiratory Rate: 16 breaths/min, Blood Pressure: 102/74 mmHg. General Notes: Wound exam; fairly substantial wound over the sacrum lower coccyx tip of this at the lower coccyx level. As noted in the HPI this as roughly 1/2-3/4 of an inch proximity to his anal opening. That is the distance he would have toformulated seal if he were going to consider wound VAC. He continues to have rolled macerated tissue around a large amount of the circumference of the wound in spite of several debridement this area, I think more debridement is going to need to be done. Furthermore I think the overall wound  area seeds any advanced treatment option we could easily order. Integumentary (Hair, Skin) Wound #5 status is Open. Original cause of wound was Pressure Injury. The wound is located on the Sacrum. The wound measures 13cm length x 9cm width x 2cm depth; 91.892cm^2 area and 183.783cm^3 volume. There is Fat Layer (Subcutaneous Tissue) Exposed exposed. There is no tunneling or undermining noted. There is a large amount of serous drainage noted. The wound margin is thickened. There is large (67-100%) red granulation within the wound bed. There is a small (1-33%) amount of necrotic tissue within the wound bed including Adherent Slough. The periwound skin appearance exhibited: Maceration. The periwound skin appearance did not exhibit: Callus, Crepitus, Excoriation, Induration, Rash, Scarring, Dry/Scaly, Atrophie Blanche, Cyanosis, Ecchymosis, Hemosiderin Staining, Mottled, Pallor, Rubor, Erythema. Periwound temperature was noted as No Abnormality. Assessment Bradley [name], Bradley [name] ([id number]) Active Problems ICD-10 [phone] - Pressure ulcer of sacral region, stage 3 G82.21 - Paraplegia, complete Plan Wound Cleansing: Wound #5 Sacrum: Clean wound with Normal Saline. May Shower, gently pat wound dry prior to applying new  dressing. Anesthetic: Wound #5 Sacrum: Topical Lidocaine 4% cream applied to wound bed prior to debridement Skin Barriers/Peri-Wound Care: Wound #5 Sacrum: Skin Prep Primary Wound Dressing: Wound #5 Sacrum: Hydrafera Blue - Please provide hydrofera blue ready transfer for patient as patient is unable to cut to fit the dressing to his wound and this product does not have to be cut to fit. Also please provide supplies for daily dressing changes d/t drainage amount Secondary Dressing: Wound #5 Sacrum: ABD pad Dry Gauze XtraSorb - HHRN to please provide this for patient Dressing Change Frequency: Wound #5 Sacrum: Change dressing every day. - HHRN to visit 3 times weekly Follow-up Appointments: Wound #5 Sacrum: Return Appointment in 2 weeks. Off-Loading: Wound #5 Sacrum: Roho cushion for wheelchair Mattress - continue air mattress Turn and reposition every 2 hours Additional Orders / Instructions: Wound #5 Sacrum: Increase protein intake. Home Health: Wound #5 Sacrum: Bradley [name] - HHRN to visit patient 3 times weekly. HHRN to provide dressing materials JIHO, ECKART ([id number]) for patient to allow him to change dressing daily and as needed Home Health Nurse may visit PRN to address patient s wound care needs. FACE TO FACE ENCOUNTER: MEDICARE and MEDICAID PATIENTS: I certify that this patient is under my care and that I had a face-to-face encounter that meets the physician face-to-face encounter requirements with this patient on this date. The encounter with the patient was in whole or in part for the following MEDICAL CONDITION: (primary reason for [place]) MEDICAL NECESSITY: I certify, that based on my findings, NURSING services are a medically necessary home health service. HOME BOUND STATUS: I certify that my clinical findings support that this patient is homebound (i.e., Due to illness or injury, pt requires aid of supportive devices such as  crutches, cane, wheelchairs, walkers, the use of special transportation or the assistance of another person to leave their place of residence. There is a normal inability to leave the home and doing so requires considerable and taxing effort. Other absences are for medical reasons / religious services and are infrequent or of short duration when for other reasons). If current dressing causes regression in wound condition, may D/C ordered dressing product/s and apply Normal Saline Moist Dressing daily until next [place] / Other MD appointment. [place] of regression in wound condition at [phone]. Please direct any NON-WOUND related issues/requests for orders to patient's Primary Care  Physician #1 I think this wound is not amenable to a wound VAC for the reasons discussed although we will try to discuss this with the nurse from [facility] #2 he is probably going to need more work on the tissue at the circumference of the wound although this looks better in some areas #3 I think the openly viable option here would be to send him to plastic surgery for an operative debridement and probably ACE[facility], he has not been too receptive to this in the past, I'll try to talk to him about this next visit Electronic Signature(s) Signed: 07/01/2016 8:00:12 AM By: Gretta Cool, RN, BSN, Kim RN, BSN Signed: 07/20/2016 7:48:39 AM By: Linton Ham MD Previous Signature: 06/28/2016 4:44:13 PM Version By: Linton Ham MD Entered By: Gretta Cool, RN, BSN, Kim on 07/01/2016 08:00:12 Bradley [name] ([id number]) -------------------------------------------------------------------------------- [place] Details Patient Name: TOMOTHY, OLIVOS A. Date of Service: 06/28/2016 Medical Record Number: [id number] Patient Account Number: [id number] Date of Birth/Sex: [birth year] (71 y.o. Male) Treating RN: Montey Hora Primary Care Provider: [name] Other Clinician: Referring  Provider: [name] Treating Provider/Extender: Ricard Dillon Service Line: Outpatient Weeks in Treatment: 17 Diagnosis Coding ICD-10 Codes Code Description L89.153 Pressure ulcer of sacral region, stage 3 G82.21 Paraplegia, complete Facility Procedures CPT4 Code: [id number] Description: [phone] - WOUND CARE VISIT-LEV 2 EST PT Modifier: Quantity: 1 Physician Procedures CPT4 Code: [id number] Description: [id number] - WC PHYS LEVEL 2 - EST PT ICD-10 Description Diagnosis L89.153 Pressure ulcer of sacral region, stage 3 Modifier: Quantity: 1 Electronic Signature(s) Signed: 06/28/2016 4:44:13 PM By: Linton Ham MD Entered By: Linton Ham on 06/28/2016 10:55:32

## 2016-06-29 NOTE — Progress Notes (Signed)
ZADYN, ALBERDA ([id number]) Visit Report for 06/28/2016 Arrival Information Details Patient Name: Bradley [name], ROTHKOPF. Date of Service: 06/28/2016 10:00 AM Medical Record Number: [id number] Patient Account Number: [id number] Date of Birth/Sex: 02/14/1946 (71 y.o. Male) Treating RN: Montey Hora Primary Care Janee Ureste: [name] Other Clinician: Referring Yudith Norlander: [name] Treating Barbera Perritt/Extender: Tito Dine in Treatment: 30 Visit Information History Since Last Visit Added or deleted any medications: No Patient Arrived: Wheel Chair Any new allergies or adverse reactions: No Arrival Time: 09:50 Had a fall or experienced change in No activities of daily living that may affect Accompanied By: self risk of falls: Transfer Assistance: Hoyer Lift Signs or symptoms of abuse/neglect since last No Patient Identification Verified: Yes visito Secondary Verification Process Yes Hospitalized since last visit: No Completed: Has Dressing in Place as Prescribed: Yes Pain Present Now: No Electronic Signature(s) Signed: 06/28/2016 4:54:21 PM By: Montey Hora Entered By: Montey Hora on 06/28/2016 09:50:47 [name] -------------------------------------------------------------------------------- Clinic Level of Care Assessment Details Patient Name: Bradley Loa A. Date of Service: 06/28/2016 10:00 AM Medical Record Number: [id number] Patient Account Number: [id number] Date of Birth/Sex: 1945/10/06 (71 y.o. Male) Treating RN: Montey Hora Primary Care Macauley Mossberg: [name] Other Clinician: Referring Aryannah Mohon: [name] Treating Loan Oguin/Extender: Tito Dine in Treatment: 17 Clinic Level of Care Assessment Items TOOL 4 Quantity Score []  - Use when only an EandM is performed on FOLLOW-UP visit 0 ASSESSMENTS - Nursing Assessment / Reassessment X - Reassessment of Co-morbidities (includes updates in patient  status) 1 10 X - Reassessment of Adherence to Treatment Plan 1 5 ASSESSMENTS - Wound and Skin Assessment / Reassessment X - Simple Wound Assessment / Reassessment - one wound 1 5 []  - Complex Wound Assessment / Reassessment - multiple wounds 0 []  - Dermatologic / Skin Assessment (not related to wound area) 0 ASSESSMENTS - Focused Assessment []  - Circumferential Edema Measurements - multi extremities 0 []  - Nutritional Assessment / Counseling / Intervention 0 []  - Lower Extremity Assessment (monofilament, tuning fork, pulses) 0 []  - Peripheral Arterial Disease Assessment (using hand held doppler) 0 ASSESSMENTS - Ostomy and/or Continence Assessment and Care []  - Incontinence Assessment and Management 0 []  - Ostomy Care Assessment and Management (repouching, etc.) 0 PROCESS - Coordination of Care X - Simple Patient / Family Education for ongoing care 1 15 []  - Complex (extensive) Patient / Family Education for ongoing care 0 []  - Staff obtains Programmer, systems, Records, Test Results / Process Orders 0 []  - Staff telephones HHA, Nursing Homes / Clarify orders / etc 0 []  - Routine Transfer to another Facility (non-emergent condition) 0 DHRUVAN, SATTERLY ([id number]) []  - Routine Hospital Admission (non-emergent condition) 0 []  - New Admissions / Biomedical engineer / Ordering NPWT, Apligraf, etc. 0 []  - Emergency Hospital Admission (emergent condition) 0 X - Simple Discharge Coordination 1 10 []  - Complex (extensive) Discharge Coordination 0 PROCESS - Special Needs []  - Pediatric / Minor Patient Management 0 []  - Isolation Patient Management 0 []  - Hearing / Language / Visual special needs 0 []  - Assessment of Community assistance (transportation, D/C planning, etc.) 0 []  - Additional assistance / Altered mentation 0 []  - Support Surface(s) Assessment (bed, cushion, seat, etc.) 0 INTERVENTIONS - Wound Cleansing / Measurement X - Simple Wound Cleansing - one wound 1 5 []  - Complex Wound  Cleansing - multiple wounds 0 X - Wound Imaging (photographs - any number of wounds) 1 5 []  - Wound Tracing (instead of photographs) 0  X - Simple Wound Measurement - one wound 1 5 []  - Complex Wound Measurement - multiple wounds 0 INTERVENTIONS - Wound Dressings X - Small Wound Dressing one or multiple wounds 1 10 []  - Medium Wound Dressing one or multiple wounds 0 []  - Large Wound Dressing one or multiple wounds 0 []  - Application of Medications - topical 0 []  - Application of Medications - injection 0 INTERVENTIONS - Miscellaneous []  - External ear exam 0 GOTTLIEB, [name] A. ([id number]) []  - Specimen Collection (cultures, biopsies, blood, body fluids, etc.) 0 []  - Specimen(s) / Culture(s) sent or taken to Lab for analysis 0 []  - Patient Transfer (multiple staff / Harrel Lemon Lift / Similar devices) 0 []  - Simple Staple / Suture removal (25 or less) 0 []  - Complex Staple / Suture removal (26 or more) 0 []  - Hypo / Hyperglycemic Management (close monitor of Blood Glucose) 0 []  - Ankle / Brachial Index (ABI) - do not check if billed separately 0 X - Vital Signs 1 5 Has the patient been seen at the hospital within the last three years: Yes Total Score: 75 Level Of Care: New/Established - Level 2 Electronic Signature(s) Signed: 06/28/2016 4:54:21 PM By: Montey Hora Entered By: Montey Hora on 06/28/2016 10:15:46 [name] -------------------------------------------------------------------------------- Complex / Palliative Patient Assessment Details Patient Name: Bradley Loa A. Date of Service: 06/28/2016 10:00 AM Medical Record Number: [id number] Patient Account Number: [id number] Date of Birth/Sex: Dec 01, 1945 (71 y.o. Male) Treating RN: Montey Hora Primary Care Renate Danh: [name] Other Clinician: Referring Bern Fare: [name] Treating Satchel Heidinger/Extender: Tito Dine in Treatment: 17 Palliative Management Criteria Complex Wound  Management Criteria Patient requires a surgical procedure in order to achieve wound healing: plastic surgery - patient refused However, the patient does not wish to undergo the recommended surgical procedure. Care Approach Wound Care Plan: Complex Wound Management Notes patient wound need a colostomy for plastic surgery r/t location of wound to anus and patient refuses colostomy Electronic Signature(s) Signed: 06/28/2016 10:46:08 AM By: Montey Hora Signed: 06/28/2016 4:44:13 PM By: Linton Ham MD Previous Signature: 06/28/2016 10:10:06 AM Version By: Montey Hora Entered By: Montey Hora on 06/28/2016 10:46:08 [name] -------------------------------------------------------------------------------- Encounter Discharge Information Details Patient Name: ZAKARIYAH, DEAMER A. Date of Service: 06/28/2016 10:00 AM Medical Record Number: [id number] Patient Account Number: [id number] Date of Birth/Sex: 06-17-1945 (71 y.o. Male) Treating RN: Montey Hora Primary Care Natori Gudino: [name] Other Clinician: Referring Selen Smucker: [name] Treating Maloni Musleh/Extender: Tito Dine in Treatment: 49 Encounter Discharge Information Items Discharge Pain Level: 0 Discharge Condition: Stable Ambulatory Status: Wheelchair Discharge Destination: Home Transportation: Private Auto Accompanied By: self Schedule Follow-up Appointment: Yes Medication Reconciliation completed and provided to Patient/Care No Curt Oatis: Provided on Clinical Summary of Care: 06/28/2016 Form Type Recipient Paper Patient JG Electronic Signature(s) Signed: 06/28/2016 10:31:56 AM By: Ruthine Dose Entered By: Ruthine Dose on 06/28/2016 10:31:55 [name] -------------------------------------------------------------------------------- General Visit Notes Details Patient Name: [name]. Date of Service: 06/28/2016 10:00 AM Medical Record Number:  [id number] Patient Account Number: [id number] Date of Birth/Sex: 10-02-1945 (71 y.o. Male) Treating RN: Montey Hora Primary Care Eugina Row: [name] Other Clinician: Referring Marcelle Hepner: [name] Treating Rainn Zupko/Extender: Tito Dine in Treatment: 17 Notes Patient states his [facility] LInda C wants to try a wound vac and thinks she can get a seal around wound. I called her to discuss this as she takes frequent vacations and there also needs to be another nurse who can change  NPWT dressing if she is unavailable. I left her a message to return my call. Electronic Signature(s) Signed: 06/28/2016 10:47:30 AM By: Montey Hora Entered By: Montey Hora on 06/28/2016 10:47:30 [name] -------------------------------------------------------------------------------- Multi Wound Chart Details Patient Name: Bradley Loa A. Date of Service: 06/28/2016 10:00 AM Medical Record Number: [id number] Patient Account Number: [id number] Date of Birth/Sex: 04/19/1946 (71 y.o. Male) Treating RN: Montey Hora Primary Care Keeli Roberg: [name] Other Clinician: Referring Tavien Chestnut: [name] Treating Jameah Rouser/Extender: Tito Dine in Treatment: 17 Vital Signs Height(in): Pulse(bpm): 73 Weight(lbs): Blood Pressure 102/74 (mmHg): Body Mass Index(BMI): Temperature(F): Respiratory Rate 16 (breaths/min): Photos: [5:No Photos] [N/A:N/A] Wound Location: [5:Sacrum] [N/A:N/A] Wounding Event: [5:Pressure Injury] [N/A:N/A] Primary Etiology: [5:Pressure Ulcer] [N/A:N/A] Comorbid History: [5:Hypertension, Rheumatoid Arthritis, Paraplegia] [N/A:N/A] Date Acquired: [5:11/05/2014] [N/A:N/A] Weeks of Treatment: [5:17] [N/A:N/A] Wound Status: [5:Open] [N/A:N/A] Measurements L x W x D 13x9x2 [N/A:N/A] (cm) Area (cm) : [5:91.892] [N/A:N/A] Volume (cm) : [5:183.783] [N/A:N/A] % Reduction in Area: [5:-33.70%] [N/A:N/A] % Reduction  in Volume: -11.40% [N/A:N/A] Classification: [5:Category/Stage III] [N/A:N/A] Exudate Amount: [5:Large] [N/A:N/A] Exudate Type: [5:Serous] [N/A:N/A] Exudate Color: [5:amber] [N/A:N/A] Wound Margin: [5:Thickened] [N/A:N/A] Granulation Amount: [5:Large (67-100%)] [N/A:N/A] Granulation Quality: [5:Red] [N/A:N/A] Necrotic Amount: [5:Small (1-33%)] [N/A:N/A] Exposed Structures: [5:Fat Layer (Subcutaneous Tissue) Exposed: Yes Fascia: No Tendon: No Muscle: No] [N/A:N/A] Joint: No Bone: No Epithelialization: None N/A N/A Periwound Skin Texture: Excoriation: No N/A N/A Induration: No Callus: No Crepitus: No Rash: No Scarring: No Periwound Skin Maceration: Yes N/A N/A Moisture: Dry/Scaly: No Periwound Skin Color: Atrophie Blanche: No N/A N/A Cyanosis: No Ecchymosis: No Erythema: No Hemosiderin Staining: No Mottled: No Pallor: No Rubor: No Temperature: No Abnormality N/A N/A Tenderness on No N/A N/A Palpation: Wound Preparation: Ulcer Cleansing: N/A N/A Rinsed/Irrigated with Saline Topical Anesthetic Applied: Other: lidocaine 4% Treatment Notes Wound #5 (Sacrum) 1. Cleansed with: Clean wound with Normal Saline 4. Dressing Applied: Hydrafera Blue Other dressing (specify in notes) 5. Secondary Dressing Applied ABD Pad 7. Secured with Tape Notes Manufacturing systems engineer) Signed: 06/28/2016 4:44:13 PM By: Linton Ham MD [name] Entered By: Linton Ham on 06/28/2016 10:48:37 [name] -------------------------------------------------------------------------------- [place] Details Patient Name: PHAT, REBAR. Date of Service: 06/28/2016 10:00 AM Medical Record Number: [id number] Patient Account Number: [id number] Date of Birth/Sex: 06-19-1945 (71 y.o. Male) Treating RN: Montey Hora Primary Care Aries Townley: [name] Other Clinician: Referring Harith Mccadden: [name] Treating  Carnella Fryman/Extender: Tito Dine in Treatment: 17 Active Inactive ` Abuse / Safety / Falls / Self Care Management Nursing Diagnoses: Impaired physical mobility Potential for falls Goals: Patient will remain injury free Date Initiated: 03/01/2016 Target Resolution Date: 07/12/2016 Goal Status: Active Interventions: Assess fall risk on admission and as needed Notes: ` Pressure Nursing Diagnoses: Knowledge deficit related to causes and risk factors for pressure ulcer development Goals: Patient will remain free from development of additional pressure ulcers Date Initiated: 03/01/2016 Target Resolution Date: 07/19/2016 Goal Status: Active Interventions: Provide education on pressure ulcers Notes: ` Wound/Skin Impairment Nursing Diagnoses: Impaired tissue integrity TRUMAINE, MUSSELMAN ([id number]) Goals: Patient/caregiver will verbalize understanding of skin care regimen Date Initiated: 03/01/2016 Target Resolution Date: 07/05/2016 Goal Status: Active Ulcer/skin breakdown will have a volume reduction of 30% by week 4 Date Initiated: 03/01/2016 Target Resolution Date: 07/12/2016 Goal Status: Active Ulcer/skin breakdown will have a volume reduction of 50% by week 8 Date Initiated: 03/01/2016 Target Resolution Date: 07/12/2016 Goal Status: Active Ulcer/skin breakdown will have a volume reduction of 80%  by week 12 Date Initiated: 03/01/2016 Target Resolution Date: 07/12/2016 Goal Status: Active Ulcer/skin breakdown will heal within 14 weeks Date Initiated: 03/01/2016 Target Resolution Date: 07/12/2016 Goal Status: Active Interventions: Assess patient/caregiver ability to obtain necessary supplies Assess patient/caregiver ability to perform ulcer/skin care regimen upon admission and as needed Assess ulceration(s) every visit Notes: Electronic Signature(s) Signed: 06/28/2016 10:09:30 AM By: Montey Hora Entered By: Montey Hora on 06/28/2016 10:09:30 [name]  ([id number]) -------------------------------------------------------------------------------- Pain Assessment Details Patient Name: Bradley Loa A. Date of Service: 06/28/2016 10:00 AM Medical Record Number: [id number] Patient Account Number: [id number] Date of Birth/Sex: May 18, 1946 (71 y.o. Male) Treating RN: Montey Hora Primary Care Pollyann Roa: [name] Other Clinician: Referring Talissa Apple: [name] Treating Royden Bulman/Extender: Tito Dine in Treatment: 17 Active Problems Location of Pain Severity and Description of Pain Patient Has Paino No Site Locations Pain Management and Medication Current Pain Management: Notes Topical or injectable lidocaine is offered to patient for acute pain when surgical debridement is performed. If needed, Patient is instructed to use over the counter pain medication for the following 24-48 hours after debridement. Wound care MDs do not prescribed pain medications. Patient has chronic pain or uncontrolled pain. Patient has been instructed to make an appointment with their Primary Care Physician for pain management. Electronic Signature(s) Signed: 06/28/2016 4:54:21 PM By: Montey Hora Entered By: Montey Hora on 06/28/2016 09:51:01 [name] -------------------------------------------------------------------------------- Patient/Caregiver Education Details Patient Name: [name] Date of Service: 06/28/2016 10:00 AM Medical Record Number: [id number] Patient Account Number: [id number] Date of Birth/Gender: 1945/06/27 (71 y.o. Male) Treating RN: Montey Hora Primary Care Physician: [name] Other Clinician: Referring Physician: [name] Treating Physician/Extender: Tito Dine in Treatment: 73 Education Assessment Education Provided To: Patient Education Topics Provided Wound/Skin Impairment: Handouts: Other: wound care as ordered Methods: Demonstration,  Explain/Verbal Responses: State content correctly Electronic Signature(s) Signed: 06/28/2016 4:54:21 PM By: Montey Hora Entered By: Montey Hora on 06/28/2016 10:11:41 [name] -------------------------------------------------------------------------------- Wound Assessment Details Patient Name: Bradley Loa A. Date of Service: 06/28/2016 10:00 AM Medical Record Number: [id number] Patient Account Number: [id number] Date of Birth/Sex: 11/07/1945 (71 y.o. Male) Treating RN: Montey Hora Primary Care Classie Weng: [name] Other Clinician: Referring Elric Tirado: [name] Treating Chico Cawood/Extender: Ricard Dillon Weeks in Treatment: 17 Wound Status Wound Number: 5 Primary Pressure Ulcer Etiology: Wound Location: Sacrum Wound Status: Open Wounding Event: Pressure Injury Comorbid Hypertension, Rheumatoid Arthritis, Date Acquired: 11/05/2014 History: Paraplegia Weeks Of Treatment: 17 Clustered Wound: No Photos Wound Measurements Length: (cm) 13 Width: (cm) 9 Depth: (cm) 2 Area: (cm) 91.892 Volume: (cm) 183.783 % Reduction in Area: -33.7% % Reduction in Volume: -11.4% Epithelialization: None Tunneling: No Undermining: No Wound Description Classification: Category/Stage III Foul Odor Afte Wound Margin: Thickened Exudate Amount: Large Exudate Type: Serous Exudate Color: amber r Cleansing: No Wound Bed Granulation Amount: Large (67-100%) Exposed Structure Granulation Quality: Red Fascia Exposed: No Necrotic Amount: Small (1-33%) Fat Layer (Subcutaneous Tissue) Exposed: Yes Necrotic Quality: Adherent Slough Tendon Exposed: No Muscle Exposed: No ORRIS, CLANTON A. ([id number]) Joint Exposed: No Bone Exposed: No Periwound Skin Texture Texture Color No Abnormalities Noted: No No Abnormalities Noted: No Callus: No Atrophie Blanche: No Crepitus: No Cyanosis: No Excoriation: No Ecchymosis: No Induration: No Erythema:  No Rash: No Hemosiderin Staining: No Scarring: No Mottled: No Pallor: No Moisture Rubor: No No Abnormalities Noted: No Dry / Scaly: No Temperature / Pain Maceration: Yes Temperature: No Abnormality Wound Preparation Ulcer Cleansing: Rinsed/Irrigated with Saline Topical Anesthetic  Applied: Other: lidocaine 4%, Treatment Notes Wound #5 (Sacrum) 1. Cleansed with: Clean wound with Normal Saline 4. Dressing Applied: Hydrafera Blue Other dressing (specify in notes) 5. Secondary Dressing Applied ABD Pad 7. Secured with Tape Notes Manufacturing systems engineer) Signed: 06/28/2016 4:54:21 PM By: Montey Hora Previous Signature: 06/28/2016 10:08:43 AM Version By: Montey Hora Entered By: Montey Hora on 06/28/2016 11:10:51 [name] -------------------------------------------------------------------------------- [place] Details Patient Name: Bradley Loa A. Date of Service: 06/28/2016 10:00 AM Medical Record Number: [id number] Patient Account Number: [id number] Date of Birth/Sex: September 19, 1945 (71 y.o. Male) Treating RN: Montey Hora Primary Care Lawanda Holzheimer: [name] Other Clinician: Referring Jozelyn Kuwahara: [name] Treating Alaia Lordi/Extender: Ricard Dillon Weeks in Treatment: 17 Vital Signs Time Taken: 09:51 Pulse (bpm): 73 Respiratory Rate (breaths/min): 16 Blood Pressure (mmHg): 102/74 Reference Range: 80 - 120 mg / dl Electronic Signature(s) Signed: 06/28/2016 4:54:21 PM By: Montey Hora Entered By: Montey Hora on 06/28/2016 09:54:03

## 2016-06-30 DIAGNOSIS — Z87828 Personal history of other (healed) physical injury and trauma: Secondary | ICD-10-CM | POA: Diagnosis not present

## 2016-06-30 DIAGNOSIS — L8943 Pressure ulcer of contiguous site of back, buttock and hip, stage 3: Secondary | ICD-10-CM | POA: Diagnosis not present

## 2016-06-30 DIAGNOSIS — I10 Essential (primary) hypertension: Secondary | ICD-10-CM | POA: Diagnosis not present

## 2016-06-30 DIAGNOSIS — M069 Rheumatoid arthritis, unspecified: Secondary | ICD-10-CM | POA: Diagnosis not present

## 2016-06-30 DIAGNOSIS — Z4800 Encounter for change or removal of nonsurgical wound dressing: Secondary | ICD-10-CM | POA: Diagnosis not present

## 2016-06-30 DIAGNOSIS — G8221 Paraplegia, complete: Secondary | ICD-10-CM | POA: Diagnosis not present

## 2016-07-01 DIAGNOSIS — I10 Essential (primary) hypertension: Secondary | ICD-10-CM | POA: Diagnosis not present

## 2016-07-01 DIAGNOSIS — G8221 Paraplegia, complete: Secondary | ICD-10-CM | POA: Diagnosis not present

## 2016-07-01 DIAGNOSIS — Z4800 Encounter for change or removal of nonsurgical wound dressing: Secondary | ICD-10-CM | POA: Diagnosis not present

## 2016-07-01 DIAGNOSIS — L8943 Pressure ulcer of contiguous site of back, buttock and hip, stage 3: Secondary | ICD-10-CM | POA: Diagnosis not present

## 2016-07-01 DIAGNOSIS — Z87828 Personal history of other (healed) physical injury and trauma: Secondary | ICD-10-CM | POA: Diagnosis not present

## 2016-07-01 DIAGNOSIS — M069 Rheumatoid arthritis, unspecified: Secondary | ICD-10-CM | POA: Diagnosis not present

## 2016-07-06 DIAGNOSIS — G8221 Paraplegia, complete: Secondary | ICD-10-CM | POA: Diagnosis not present

## 2016-07-06 DIAGNOSIS — I10 Essential (primary) hypertension: Secondary | ICD-10-CM | POA: Diagnosis not present

## 2016-07-06 DIAGNOSIS — M069 Rheumatoid arthritis, unspecified: Secondary | ICD-10-CM | POA: Diagnosis not present

## 2016-07-06 DIAGNOSIS — Z87828 Personal history of other (healed) physical injury and trauma: Secondary | ICD-10-CM | POA: Diagnosis not present

## 2016-07-06 DIAGNOSIS — Z4800 Encounter for change or removal of nonsurgical wound dressing: Secondary | ICD-10-CM | POA: Diagnosis not present

## 2016-07-06 DIAGNOSIS — L8943 Pressure ulcer of contiguous site of back, buttock and hip, stage 3: Secondary | ICD-10-CM | POA: Diagnosis not present

## 2016-07-08 DIAGNOSIS — L8943 Pressure ulcer of contiguous site of back, buttock and hip, stage 3: Secondary | ICD-10-CM | POA: Diagnosis not present

## 2016-07-08 DIAGNOSIS — Z4800 Encounter for change or removal of nonsurgical wound dressing: Secondary | ICD-10-CM | POA: Diagnosis not present

## 2016-07-08 DIAGNOSIS — Z87828 Personal history of other (healed) physical injury and trauma: Secondary | ICD-10-CM | POA: Diagnosis not present

## 2016-07-08 DIAGNOSIS — G8221 Paraplegia, complete: Secondary | ICD-10-CM | POA: Diagnosis not present

## 2016-07-08 DIAGNOSIS — M069 Rheumatoid arthritis, unspecified: Secondary | ICD-10-CM | POA: Diagnosis not present

## 2016-07-08 DIAGNOSIS — I10 Essential (primary) hypertension: Secondary | ICD-10-CM | POA: Diagnosis not present

## 2016-07-12 ENCOUNTER — Encounter: Payer: Medicare HMO | Attending: Internal Medicine | Admitting: Internal Medicine

## 2016-07-12 DIAGNOSIS — G8221 Paraplegia, complete: Secondary | ICD-10-CM | POA: Insufficient documentation

## 2016-07-12 DIAGNOSIS — M069 Rheumatoid arthritis, unspecified: Secondary | ICD-10-CM | POA: Diagnosis not present

## 2016-07-12 DIAGNOSIS — I10 Essential (primary) hypertension: Secondary | ICD-10-CM | POA: Insufficient documentation

## 2016-07-12 DIAGNOSIS — L89153 Pressure ulcer of sacral region, stage 3: Secondary | ICD-10-CM | POA: Insufficient documentation

## 2016-07-13 DIAGNOSIS — Z87828 Personal history of other (healed) physical injury and trauma: Secondary | ICD-10-CM | POA: Diagnosis not present

## 2016-07-13 DIAGNOSIS — M24562 Contracture, left knee: Secondary | ICD-10-CM | POA: Diagnosis not present

## 2016-07-13 DIAGNOSIS — Z4800 Encounter for change or removal of nonsurgical wound dressing: Secondary | ICD-10-CM | POA: Diagnosis not present

## 2016-07-13 DIAGNOSIS — L8943 Pressure ulcer of contiguous site of back, buttock and hip, stage 3: Secondary | ICD-10-CM | POA: Diagnosis not present

## 2016-07-13 DIAGNOSIS — M069 Rheumatoid arthritis, unspecified: Secondary | ICD-10-CM | POA: Diagnosis not present

## 2016-07-13 DIAGNOSIS — M24561 Contracture, right knee: Secondary | ICD-10-CM | POA: Diagnosis not present

## 2016-07-13 DIAGNOSIS — G8221 Paraplegia, complete: Secondary | ICD-10-CM | POA: Diagnosis not present

## 2016-07-13 DIAGNOSIS — I10 Essential (primary) hypertension: Secondary | ICD-10-CM | POA: Diagnosis not present

## 2016-07-14 DIAGNOSIS — G8220 Paraplegia, unspecified: Secondary | ICD-10-CM | POA: Diagnosis not present

## 2016-07-14 NOTE — Progress Notes (Addendum)
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 still has no pain being a quadriplegic. 04-26-16 Mr. Puentes returns today for a violation of his stage III sacral pressure ulcer he denies any complaints concerns or issues over the past 2 weeks. He missed to changing dressing twice daily due to drainage although he states this is not an increase in drainage over the past 2 weeks. He does change his dressings independently. He admits to sitting in his motorized chair for no more than 2-3 hours at which time he transfers to bed and rotates lateral position. 05/10/16; Valentino Matuszek returns today for review of his stage III sacral pressure ulcer. He denies any concerns over the last 2 weeks although he seems to be running out of Aquacel Ag and on those days he uses [employer]ih[employer].  [facility] has [facility] was supplying his dressings. He still complains of drainage. He does his dressings independently. He has in his motorized chair for 2-3 hours that time other than that he offloads this. Dimensions of the wound are down 1 cm in both directions. He underwent an aggressive debridement on his last visit of thick circumferential skin and subcutaneous tissue. It is possible at some point in the future he is going to need this done again 05/24/16; the patient returns today for review of his stage III sacral pressure ulcer. We have been using Aquacel Ag he tells me that he changes this up to twice a day. I'm not really certain of the reason for this frequency of changing. He has some involvement from the home health nurses but I think is doing most of the changing himself which I think because of his paraplegia would be a very difficult exercise. Nevertheless he states that there is "wetness". I am not sure if there is another dressing that we could easily changed that much. I'd wanted to change to [facility] but I'll need to have a sense of how frequent  he would need to change this. 06/14/16; this is a patient returns for review of his stage III sacral pressure ulcer. We have been using Aquacel Ag and over the last 2 visits he has had extensive debridement so of the thick circumferential skin and subcutaneous tissue that surrounds this wound. In spite of this really absolutely no change in the condition of the wound warrants measurements. We have [facility] home health I believe changing the dressing on Bradley [name], ROSCOE. ([id number]) 3 occasions the patient states he does this on one occasion himself 06/28/16; this is a patient who has a fairly large stage III sacral pressure ulcer. I changed him to [facility] from Aquacel 2 weeks ago. He returns today in follow-up. In the meantime a nurse from [facility] has calledrequesting ordering of a wound VAC. He had this  discussion before. The problem is the proximity of the lowest edge of this wound to the patient's anal opening roughly 3/4 of an inch. Can't see how this can be arranged. Apparently the nurse who is calling has a lot of experience, the question would be then when she is not available would be doing this. I would not have thought that this wound is not amenable to a wound VAC because of this reason 07/12/16; the patient comes in today and I have signed orders for a wound VAC. The home health team through [facility] is convinced that he can benefit from this even though there is close proximity to his anal opening beneath the gluteal clefts. The patient does not have a bowel regimen but states he has a bowel movement every 2 days this will also provide some problem with regards to the vac seal Objective Constitutional Sitting or standing Blood Pressure is within target range for patient.. Pulse regular and within target range for patient.Marland Kitchen Respirations regular, non-labored and within target range.. Temperature is normal and within the target range for the patient.. The patient is not in any distress. There are no nutritional concerns. Vitals Time Taken: 10:17 AM, Pulse: 86 bpm, Respiratory Rate: 16 breaths/min, Blood Pressure: 116/52 mmHg. Eyes Conjunctivae clear. No discharge.. Chest Not distended no masses. General Notes: Wound exam; fairly substantial wound over the sacrum and lower coccyx and extending into the bilateral gluteal area. There is roughly 1/2-3/4 of an inch proximity to his anal opening and the skin here does not look easy to work with as far as a VAC is concerned. There is no evidence of surrounding infection using a #5 curet I remove nonviable skin and subcutaneous tissue around this large wound. His granulation does not look unhealthy. He has a divot superiorly but I don't think this represents a deeper wound surface. Integumentary (Hair, Skin) Wound #5 status is Open.  Original cause of wound was Pressure Injury. The wound is located on the Sacrum. The wound measures 12.5cm length x 8.5cm width x 2cm depth; 83.449cm^2 area and 166.897cm^3 volume. There is Fat Layer (Subcutaneous Tissue) Exposed exposed. There is no tunneling or undermining noted. There is a large amount of serous drainage noted. The wound margin is thickened. There is large (67-100%) red granulation within the wound bed. There is a small (1-33%) amount of necrotic tissue within the wound bed including Adherent Slough. The periwound skin appearance exhibited: Maceration. The periwound skin appearance did not exhibit: Callus, Crepitus, Excoriation, Induration, Rash, EUELL, SANTORELLI A. ([id number]) Scarring, Dry/Scaly, Atrophie Blanche, Cyanosis, Ecchymosis, Hemosiderin Staining, Mottled, Pallor, Rubor, Erythema. Periwound temperature was noted as No Abnormality. Assessment Active Problems  ICD-10 L89.153 - Pressure ulcer of sacral region, stage 3 G82.21 - Paraplegia, complete Procedures Wound #5 Wound #5 is a Pressure Ulcer located on the Sacrum . There was a Skin/Subcutaneous Tissue Debridement [id number]) debridement with total area of 106.25 sq cm performed by Ricard Dillon, MD. with the following instrument(s): Curette to remove Viable and Non-Viable tissue/material including Skin and Subcutaneous after achieving pain control using Lidocaine 4% Topical Solution. A time out was conducted at 10:41, prior to the start of the procedure. A Moderate amount of bleeding was controlled with Pressure. The procedure was tolerated well with a pain level of 0 throughout and a pain level of 0 following the procedure. Post Debridement Measurements: 12.5cm length x 8.5cm width x 2cm depth; 166.897cm^3 volume. Post debridement Stage noted as Category/Stage III. Character of Wound/Ulcer Post Debridement is improved. Severity of Tissue Post Debridement is: Fat layer exposed. Post procedure Diagnosis  Wound #5: Same as Pre-Procedure Plan Wound Cleansing: Wound #5 Sacrum: Clean wound with Normal Saline. May Shower, gently pat wound dry prior to applying new dressing. Anesthetic: Wound #5 Sacrum: Topical Lidocaine 4% cream applied to wound bed prior to debridement Skin Barriers/Peri-Wound Care: MARQUAN, HAGIN ([id number]) Wound #5 Sacrum: Skin Prep Primary Wound Dressing: Wound #5 Sacrum: Hydrafera Blue - Please provide hydrofera blue ready transfer for patient as patient is unable to cut to fit the dressing to his wound and this product does not have to be cut to fit. Also please provide supplies for daily dressing changes d/t drainage amount Secondary Dressing: Wound #5 Sacrum: ABD pad Dry Gauze XtraSorb - HHRN to please provide this for patient Dressing Change Frequency: Wound #5 Sacrum: Change dressing every day. - HHRN to visit 3 times weekly Follow-up Appointments: Wound #5 Sacrum: Return Appointment in 2 weeks. Off-Loading: Wound #5 Sacrum: Roho cushion for wheelchair Mattress - continue air mattress Turn and reposition every 2 hours Additional Orders / Instructions: Wound #5 Sacrum: Increase protein intake. Home Health: Wound #5 Sacrum: [facility] - HHRN to visit patient 3 times weekly. HHRN to provide dressing materials for patient to allow him to change dressing daily and as needed Home Health Nurse may visit PRN to address patient s wound care needs. FACE TO FACE ENCOUNTER: MEDICARE and MEDICAID PATIENTS: I certify that this patient is under my care and that I had a face-to-face encounter that meets the physician face-to-face encounter requirements with this patient on this date. The encounter with the patient was in whole or in part for the following MEDICAL CONDITION: (primary reason for [place]) MEDICAL NECESSITY: I certify, that based on my findings, NURSING services are a medically necessary home health service. HOME BOUND  STATUS: I certify that my clinical findings support that this patient is homebound (i.e., Due to illness or injury, pt requires aid of supportive devices such as crutches, cane, wheelchairs, walkers, the use of special transportation or the assistance of another person to leave their place of residence. There is a normal inability to leave the home and doing so requires considerable and taxing effort. Other absences are for medical reasons / religious services and are infrequent or of short duration when for other reasons). If current dressing causes regression in wound condition, may D/C ordered dressing product/s and apply Normal Saline Moist Dressing daily until next [place] / Other MD appointment. [place] of regression in wound condition at [phone]. Please direct any NON-WOUND related issues/requests for orders to patient's Primary Care  Physician Negative Pressure Wound Therapy: Wound #5 Sacrum: Wound VAC settings at 125/130 mmHg continuous pressure. Use BLACK/GREEN foam to wound cavity. Use WHITE foam to fill any tunnel/s and/or undermining. Change VAC dressing 3 X WEEK. Change canister as indicated when full. Nurse may titrate settings and frequency of dressing changes as clinically indicated. - gauze dressing not foam - HHRN please initiate once available JAISHAUN, HATHORNE ([id number]) Home Health Nurse may d/c VAC for s/s of increased infection, significant wound regression, or uncontrolled drainage. [place] at [phone]. continue hydrofera blue I have ordered a one month trial of a vac although I am not optomistic I have discussed the opotion of a colostomy and proceed with a Plastic surgery consult which he has refused for several reasons in the past Electronic Signature(s) Signed: 07/13/2016 4:28:51 PM By: Linton Ham MD Entered By: Linton Ham on 07/12/2016 12:28:05 [name]  ([id number]) -------------------------------------------------------------------------------- Bradley [name] Details Patient Name: Bradley Loa A. Date of Service: 07/12/2016 Medical Record Number: [id number] Patient Account Number: [id number] Date of Birth/Sex: 1946/02/08 (71 y.o. Male) Treating RN: Montey Hora Primary Care Provider: [name] Other Clinician: Referring Provider: [name] Treating Provider/Extender: Ricard Dillon Service Line: Outpatient Weeks in Treatment: 19 Diagnosis Coding ICD-10 Codes Code Description L89.153 Pressure ulcer of sacral region, stage 3 G82.21 Paraplegia, complete Facility Procedures CPT4 Code: [id number] Description: 11042 - DEB SUBQ TISSUE 20 SQ CM/< ICD-10 Description Diagnosis L89.153 Pressure ulcer of sacral region, stage 3 Modifier: Quantity: 1 CPT4 Code: [id number] Description: 11045 - DEB SUBQ TISS EA ADDL 20CM ICD-10 Description Diagnosis L89.153 Pressure ulcer of sacral region, stage 3 Modifier: Quantity: 3 Physician Procedures CPT4 Code: [id number] Description: 11042 - WC PHYS SUBQ TISS 20 SQ CM ICD-10 Description Diagnosis L89.153 Pressure ulcer of sacral region, stage 3 Modifier: Quantity: 1 CPT4 Code: [id number] Description: [id number] - WC PHYS SUBQ TISS EA ADDL 20 CM ICD-10 Description Diagnosis L89.153 Pressure ulcer of sacral region, stage 3 Modifier: Quantity: 3 Electronic Signature(s) Signed: 08/04/2016 4:56:48 PM By: Gretta Cool, RN, BSN, Kim RN, BSN Signed: 08/10/2016 8:03:18 AM By: Linton Ham MD JEF, HOGUE ([id number]) Previous Signature: 07/13/2016 4:28:51 PM Version By: Linton Ham MD Entered By: Gretta Cool, RN, BSN, Kim on 08/04/2016 12:47:42

## 2016-07-14 NOTE — Progress Notes (Signed)
JOURDAN, CERULLO ([id number]) Visit Report for 07/12/2016 Arrival Information Details Patient Name: MONU, BRZOSKA. Date of Service: 07/12/2016 10:15 AM Medical Record Number: [id number] Patient Account Number: [id number] Date of Birth/Sex: [birth year] (71 y.o. Male) Treating RN: Montey Hora Primary Care Shirley Decamp: [name] Other Clinician: Referring Melora Menon: [name] Treating Kaylaann Mountz/Extender: Tito Dine in Treatment: 68 Visit Information History Since Last Visit Added or deleted any medications: No Patient Arrived: Wheel Chair Any new allergies or adverse reactions: No Arrival Time: 10:05 Had a fall or experienced change in No activities of daily living that may affect Accompanied By: self risk of falls: Transfer Assistance: Hoyer Lift Signs or symptoms of abuse/neglect since last No Patient Identification Verified: Yes visito Secondary Verification Process Yes Hospitalized since last visit: No Completed: Has Dressing in Place as Prescribed: Yes Pain Present Now: No Electronic Signature(s) Signed: 07/12/2016 5:16:51 PM By: Montey Hora Entered By: Montey Hora on 07/12/2016 10:17:08 [name] -------------------------------------------------------------------------------- Encounter Discharge Information Details Patient Name: [name] Date of Service: 07/12/2016 10:15 AM Medical Record Number: [id number] Patient Account Number: [id number] Date of Birth/Sex: August 22, 1945 (71 y.o. Male) Treating RN: Montey Hora Primary Care Deonte Otting: [name] Other Clinician: Referring Chase Arnall: [name] Treating Takeira Yanes/Extender: Tito Dine in Treatment: 64 Encounter Discharge Information Items Discharge Pain Level: 0 Discharge Condition: Stable Ambulatory Status: Wheelchair Discharge Destination: Home Transportation: Private Auto Accompanied By: self Schedule Follow-up Appointment:  Yes Medication Reconciliation completed No and provided to Patient/Care Arnez Stoneking: Provided on Clinical Summary of Care: 07/12/2016 Form Type Recipient Paper Patient JG Electronic Signature(s) Signed: 07/12/2016 11:10:33 AM By: Ruthine Dose Entered By: Ruthine Dose on 07/12/2016 11:10:32 [name] -------------------------------------------------------------------------------- Multi Wound Chart Details Patient Name: [name] Date of Service: 07/12/2016 10:15 AM Medical Record Number: [id number] Patient Account Number: [id number] Date of Birth/Sex: January 20, 1946 (71 y.o. Male) Treating RN: Montey Hora Primary Care Xitlally Mooneyham: [name] Other Clinician: Referring Cinch Ormond: [name] Treating Loyd Marhefka/Extender: Tito Dine in Treatment: 19 Vital Signs Height(in): Pulse(bpm): 86 Weight(lbs): Blood Pressure 116/52 (mmHg): Body Mass Index(BMI): Temperature(F): Respiratory Rate 16 (breaths/min): Photos: [N/A:N/A] Wound Location: Sacrum N/A N/A Wounding Event: Pressure Injury N/A N/A Primary Etiology: Pressure Ulcer N/A N/A Comorbid History: Hypertension, N/A N/A Rheumatoid Arthritis, Paraplegia Date Acquired: 11/05/2014 N/A N/A Weeks of Treatment: 19 N/A N/A Wound Status: Open N/A N/A Measurements L x W x D 12.5x8.5x2 N/A N/A (cm) Area (cm) : 83.449 N/A N/A Volume (cm) : [id number] N/A N/A % Reduction in Area: -21.40% N/A N/A % Reduction in Volume: -1.20% N/A N/A Classification: Category/Stage III N/A N/A Exudate Amount: Large N/A N/A Exudate Type: Serous N/A N/A Exudate Color: amber N/A N/A Wound Margin: Thickened N/A N/A Granulation Amount: Large (67-100%) N/A N/A Granulation Quality: Red N/A N/A Necrotic Amount: Small (1-33%) N/A N/A JERIMIAH, OSTERBERG A. ([id number]) Exposed Structures: Fat Layer (Subcutaneous N/A N/A Tissue) Exposed: Yes Fascia: No Tendon: No Muscle: No Joint: No Bone: No Epithelialization:  None N/A N/A Debridement: Debridement [id number] N/A N/A [id number]) Pre-procedure 10:41 N/A N/A Verification/Time Out Taken: Pain Control: Lidocaine 4% Topical N/A N/A Solution Tissue Debrided: Skin, Subcutaneous N/A N/A Level: Skin/Subcutaneous N/A N/A Tissue Debridement Area (sq 106.25 N/A N/A cm): Instrument: Curette N/A N/A Bleeding: Moderate N/A N/A Hemostasis Achieved: Pressure N/A N/A Procedural Pain: 0 N/A N/A Post Procedural Pain: 0 N/A N/A Debridement Treatment Procedure was tolerated N/A N/A Response: well Post Debridement 12.5x8.5x2 N/A N/A Measurements L x W x D (cm) Post Debridement  [id number] N/A N/A Volume: (cm) Post Debridement Category/Stage III N/A N/A Stage: Periwound Skin Texture: Excoriation: No N/A N/A Induration: No Callus: No Crepitus: No Rash: No Scarring: No Periwound Skin Maceration: Yes N/A N/A Moisture: Dry/Scaly: No Periwound Skin Color: Atrophie Blanche: No N/A N/A Cyanosis: No Ecchymosis: No Erythema: No Hemosiderin Staining: No Mottled: No Pallor: No Rubor: No TIFFANY, PERRA A. ([id number]) Temperature: No Abnormality N/A N/A Tenderness on No N/A N/A Palpation: Wound Preparation: Ulcer Cleansing: N/A N/A Rinsed/Irrigated with Saline Topical Anesthetic Applied: Other: lidocaine 4% Procedures Performed: Debridement N/A N/A Treatment Notes Wound #5 (Sacrum) 1. Cleansed with: Clean wound with Normal Saline 2. Anesthetic Topical Lidocaine 4% cream to wound bed prior to debridement 4. Dressing Applied: Hydrafera Blue 5. Secondary Dressing Applied ABD Pad Dry Gauze 7. Secured with Tape Notes Manufacturing systems engineer) Signed: 07/13/2016 4:28:51 PM By: Linton Ham MD Entered By: Linton Ham on 07/12/2016 12:20:59 [name] -------------------------------------------------------------------------------- [place] Details Patient Name: BIBB, JIRSA A. Date of Service:  07/12/2016 10:15 AM Medical Record Number: [id number] Patient Account Number: [id number] Date of Birth/Sex: 1946-06-04 (71 y.o. Male) Treating RN: Montey Hora Primary Care Azaylah Stailey: [name] Other Clinician: Referring Ohanna Gassert: [name] Treating Domenic Schoenberger/Extender: Tito Dine in Treatment: 73 Active Inactive ` Abuse / Safety / Falls / Self Care Management Nursing Diagnoses: Impaired physical mobility Potential for falls Goals: Patient will remain injury free Date Initiated: 03/01/2016 Target Resolution Date: 07/12/2016 Goal Status: Active Interventions: Assess fall risk on admission and as needed Notes: ` Pressure Nursing Diagnoses: Knowledge deficit related to causes and risk factors for pressure ulcer development Goals: Patient will remain free from development of additional pressure ulcers Date Initiated: 03/01/2016 Target Resolution Date: 07/19/2016 Goal Status: Active Interventions: Provide education on pressure ulcers Notes: ` Wound/Skin Impairment Nursing Diagnoses: Impaired tissue integrity ORSON, GERICKE ([id number]) Goals: Patient/caregiver will verbalize understanding of skin care regimen Date Initiated: 03/01/2016 Target Resolution Date: 07/05/2016 Goal Status: Active Ulcer/skin breakdown will have a volume reduction of 30% by week 4 Date Initiated: 03/01/2016 Target Resolution Date: 07/12/2016 Goal Status: Active Ulcer/skin breakdown will have a volume reduction of 50% by week 8 Date Initiated: 03/01/2016 Target Resolution Date: 07/12/2016 Goal Status: Active Ulcer/skin breakdown will have a volume reduction of 80% by week 12 Date Initiated: 03/01/2016 Target Resolution Date: 07/12/2016 Goal Status: Active Ulcer/skin breakdown will heal within 14 weeks Date Initiated: 03/01/2016 Target Resolution Date: 07/12/2016 Goal Status: Active Interventions: Assess patient/caregiver ability to obtain necessary supplies Assess  patient/caregiver ability to perform ulcer/skin care regimen upon admission and as needed Assess ulceration(s) every visit Notes: Electronic Signature(s) Signed: 07/12/2016 5:16:51 PM By: Montey Hora Entered By: Montey Hora on 07/12/2016 10:27:50 [name] -------------------------------------------------------------------------------- Pain Assessment Details Patient Name: [name] Date of Service: 07/12/2016 10:15 AM Medical Record Number: [id number] Patient Account Number: [id number] Date of Birth/Sex: 12/23/1945 (71 y.o. Male) Treating RN: Montey Hora Primary Care Hughie [name]: [name] Other Clinician: Referring Nevan Creighton: [name] Treating Lilas Diefendorf/Extender: Tito Dine in Treatment: 30 Active Problems Location of Pain Severity and Description of Pain Patient Has Paino No Site Locations Pain Management and Medication Current Pain Management: Notes Topical or injectable lidocaine is offered to patient for acute pain when surgical debridement is performed. If needed, Patient is instructed to use over the counter pain medication for the following 24-48 hours after debridement. Wound care MDs do not prescribed pain medications. Patient has chronic pain or uncontrolled pain. Patient has been instructed  to make an appointment with their Primary Care Physician for pain management. Electronic Signature(s) Signed: 07/12/2016 5:16:51 PM By: Montey Hora Entered By: Montey Hora on 07/12/2016 10:17:21 [name] -------------------------------------------------------------------------------- Patient/Caregiver Education Details Patient Name: [name] Date of Service: 07/12/2016 10:15 AM Medical Record Number: [id number] Patient Account Number: [id number] Date of Birth/Gender: 11/08/1945 (71 y.o. Male) Treating RN: Montey Hora Primary Care Physician: [name] Other Clinician: Referring  Physician: [name] Treating Physician/Extender: Tito Dine in Treatment: 71 Education Assessment Education Provided To: Patient Education Topics Provided Wound/Skin Impairment: Handouts: Other: wound care as ordered Methods: Demonstration, Explain/Verbal Responses: State content correctly Electronic Signature(s) Signed: 07/12/2016 5:16:51 PM By: Montey Hora Entered By: Montey Hora on 07/12/2016 10:28:48 [name] -------------------------------------------------------------------------------- Wound Assessment Details Patient Name: [name] Date of Service: 07/12/2016 10:15 AM Medical Record Number: [id number] Patient Account Number: [id number] Date of Birth/Sex: April 11, 1946 (71 y.o. Male) Treating RN: Montey Hora Primary Care Zayvon Alicea: [name] Other Clinician: Referring Cale Bethard: [name] Treating Lucca Ballo/Extender: Ricard Dillon Weeks in Treatment: 19 Wound Status Wound Number: 5 Primary Pressure Ulcer Etiology: Wound Location: Sacrum Wound Status: Open Wounding Event: Pressure Injury Comorbid Hypertension, Rheumatoid Arthritis, Date Acquired: 11/05/2014 History: Paraplegia Weeks Of Treatment: 19 Clustered Wound: No Photos Wound Measurements Length: (cm) 12.5 Width: (cm) 8.5 Depth: (cm) 2 Area: (cm) 83.449 Volume: (cm) 166.897 % Reduction in Area: -21.4% % Reduction in Volume: -1.2% Epithelialization: None Tunneling: No Undermining: No Wound Description Classification: Category/Stage III Foul Odor Afte Wound Margin: Thickened Exudate Amount: Large Exudate Type: Serous Exudate Color: amber r Cleansing: No Wound Bed Granulation Amount: Large (67-100%) Exposed Structure Granulation Quality: Red Fascia Exposed: No Necrotic Amount: Small (1-33%) Fat Layer (Subcutaneous Tissue) Exposed: Yes Necrotic Quality: Adherent Slough Tendon Exposed: No Muscle Exposed: No BINGHAM, PICCIOTTO  A. ([id number]) Joint Exposed: No Bone Exposed: No Periwound Skin Texture Texture Color No Abnormalities Noted: No No Abnormalities Noted: No Callus: No Atrophie Blanche: No Crepitus: No Cyanosis: No Excoriation: No Ecchymosis: No Induration: No Erythema: No Rash: No Hemosiderin Staining: No Scarring: No Mottled: No Pallor: No Moisture Rubor: No No Abnormalities Noted: No Dry / Scaly: No Temperature / Pain Maceration: Yes Temperature: No Abnormality Wound Preparation Ulcer Cleansing: Rinsed/Irrigated with Saline Topical Anesthetic Applied: Other: lidocaine 4%, Treatment Notes Wound #5 (Sacrum) 1. Cleansed with: Clean wound with Normal Saline 2. Anesthetic Topical Lidocaine 4% cream to wound bed prior to debridement 4. Dressing Applied: Hydrafera Blue 5. Secondary Dressing Applied ABD Pad Dry Gauze 7. Secured with Tape Notes Manufacturing systems engineer) Signed: 07/12/2016 5:16:51 PM By: Montey Hora Entered By: Montey Hora on 07/12/2016 11:24:16 [name] -------------------------------------------------------------------------------- [place] Details Patient Name: [name] Date of Service: 07/12/2016 10:15 AM Medical Record Number: [id number] Patient Account Number: [id number] Date of Birth/Sex: 09-Mar-1946 (71 y.o. Male) Treating RN: Montey Hora Primary Care Shacoya Burkhammer: [name] Other Clinician: Referring Irelyn Perfecto: [name] Treating Korver Graybeal/Extender: Ricard Dillon Weeks in Treatment: 19 Vital Signs Time Taken: 10:17 Pulse (bpm): 86 Respiratory Rate (breaths/min): 16 Blood Pressure (mmHg): 116/52 Reference Range: 80 - 120 mg / dl Electronic Signature(s) Signed: 07/12/2016 5:16:51 PM By: Montey Hora Entered By: Montey Hora on 07/12/2016 10:19:53

## 2016-07-15 DIAGNOSIS — I10 Essential (primary) hypertension: Secondary | ICD-10-CM | POA: Diagnosis not present

## 2016-07-15 DIAGNOSIS — Z4800 Encounter for change or removal of nonsurgical wound dressing: Secondary | ICD-10-CM | POA: Diagnosis not present

## 2016-07-15 DIAGNOSIS — L8943 Pressure ulcer of contiguous site of back, buttock and hip, stage 3: Secondary | ICD-10-CM | POA: Diagnosis not present

## 2016-07-15 DIAGNOSIS — M069 Rheumatoid arthritis, unspecified: Secondary | ICD-10-CM | POA: Diagnosis not present

## 2016-07-15 DIAGNOSIS — Z87828 Personal history of other (healed) physical injury and trauma: Secondary | ICD-10-CM | POA: Diagnosis not present

## 2016-07-15 DIAGNOSIS — G8221 Paraplegia, complete: Secondary | ICD-10-CM | POA: Diagnosis not present

## 2016-07-20 DIAGNOSIS — M069 Rheumatoid arthritis, unspecified: Secondary | ICD-10-CM | POA: Diagnosis not present

## 2016-07-20 DIAGNOSIS — Z4800 Encounter for change or removal of nonsurgical wound dressing: Secondary | ICD-10-CM | POA: Diagnosis not present

## 2016-07-20 DIAGNOSIS — L8943 Pressure ulcer of contiguous site of back, buttock and hip, stage 3: Secondary | ICD-10-CM | POA: Diagnosis not present

## 2016-07-20 DIAGNOSIS — I10 Essential (primary) hypertension: Secondary | ICD-10-CM | POA: Diagnosis not present

## 2016-07-20 DIAGNOSIS — Z87828 Personal history of other (healed) physical injury and trauma: Secondary | ICD-10-CM | POA: Diagnosis not present

## 2016-07-20 DIAGNOSIS — G8221 Paraplegia, complete: Secondary | ICD-10-CM | POA: Diagnosis not present

## 2016-07-22 DIAGNOSIS — G8221 Paraplegia, complete: Secondary | ICD-10-CM | POA: Diagnosis not present

## 2016-07-22 DIAGNOSIS — Z4800 Encounter for change or removal of nonsurgical wound dressing: Secondary | ICD-10-CM | POA: Diagnosis not present

## 2016-07-22 DIAGNOSIS — M069 Rheumatoid arthritis, unspecified: Secondary | ICD-10-CM | POA: Diagnosis not present

## 2016-07-22 DIAGNOSIS — L8943 Pressure ulcer of contiguous site of back, buttock and hip, stage 3: Secondary | ICD-10-CM | POA: Diagnosis not present

## 2016-07-22 DIAGNOSIS — Z87828 Personal history of other (healed) physical injury and trauma: Secondary | ICD-10-CM | POA: Diagnosis not present

## 2016-07-22 DIAGNOSIS — I10 Essential (primary) hypertension: Secondary | ICD-10-CM | POA: Diagnosis not present

## 2016-07-26 ENCOUNTER — Encounter: Payer: Medicare HMO | Admitting: Internal Medicine

## 2016-07-26 DIAGNOSIS — M069 Rheumatoid arthritis, unspecified: Secondary | ICD-10-CM | POA: Diagnosis not present

## 2016-07-26 DIAGNOSIS — G8221 Paraplegia, complete: Secondary | ICD-10-CM | POA: Diagnosis not present

## 2016-07-26 DIAGNOSIS — I10 Essential (primary) hypertension: Secondary | ICD-10-CM | POA: Diagnosis not present

## 2016-07-26 DIAGNOSIS — L89153 Pressure ulcer of sacral region, stage 3: Secondary | ICD-10-CM | POA: Diagnosis not present

## 2016-07-27 NOTE — Progress Notes (Signed)
AOI, FREHNER ([id number]) Visit Report for 07/26/2016 Arrival Information [name] Patient Name: Bradley [name], Bradley [name]. Date of Service: 07/26/2016 10:15 AM Medical Record Number: [id number] Patient Account Number: [id number] Date of Birth/Sex: [birth year] (71 y.o. Male) Treating RN: Montey Hora Primary Care Jode Lippe: [name] Other Clinician: Referring Landin Tallon: [name] Treating Clevie Prout/Extender: Tito Dine in Treatment: 21 Visit Information History Since Last Visit Added or deleted any medications: No Patient Arrived: Wheel Chair Any new allergies or adverse reactions: No Arrival Time: 10:22 Had a fall or experienced change in No activities of daily living that may affect Accompanied By: self risk of falls: Transfer Assistance: Hoyer Lift Signs or symptoms of abuse/neglect since last No Patient Identification Verified: Yes visito Secondary Verification Process Yes Hospitalized since last visit: No Completed: Has Dressing in Place as Prescribed: Yes Pain Present Now: No Electronic Signature(s) Signed: 07/26/2016 4:52:52 PM By: Montey Hora Entered By: Montey Hora on 07/26/2016 10:34:33 Bradley [name] ([id number]) -------------------------------------------------------------------------------- Encounter Discharge Information [name] Patient Name: Bradley Loa A. Date of Service: 07/26/2016 10:15 AM Medical Record Number: [id number] Patient Account Number: [id number] Date of Birth/Sex: 1946/01/26 (71 y.o. Male) Treating RN: Montey Hora Primary Care Sulaiman Imbert: [name] Other Clinician: Referring Aloysious Vangieson: [name] Treating Leeya Rusconi/Extender: Tito Dine in Treatment: 21 Encounter Discharge Information Items Discharge Pain Level: 0 Discharge Condition: Stable Ambulatory Status: Wheelchair Discharge Destination: Home Transportation: Private Auto Accompanied By: self Schedule Follow-up Appointment:  Yes Medication Reconciliation completed and provided to Patient/Care No Fotios Amos: Provided on Clinical Summary of Care: 07/26/2016 Form Type Recipient Paper Patient JG Electronic Signature(s) Signed: 07/26/2016 11:43:03 AM By: Ruthine Dose Previous Signature: 07/26/2016 11:21:29 AM Version By: Montey Hora Entered By: Ruthine Dose on 07/26/2016 11:43:02 Bradley [name] ([id number]) -------------------------------------------------------------------------------- Multi Wound Chart [name] Patient Name: Bradley Loa A. Date of Service: 07/26/2016 10:15 AM Medical Record Number: [id number] Patient Account Number: [id number] Date of Birth/Sex: May 11, 1946 (71 y.o. Male) Treating RN: Montey Hora Primary Care Providence Stivers: [name] Other Clinician: Referring Jakai Onofre: [name] Treating Ernestine Rohman/Extender: Tito Dine in Treatment: 21 Vital Signs Height(in): Pulse(bpm): 81 Weight(lbs): Blood Pressure 107/84 (mmHg): Body Mass Index(BMI): Temperature(F): 98.1 Respiratory Rate 16 (breaths/min): Photos: [5:No Photos] [N/A:N/A] Wound Location: [5:Sacrum] [N/A:N/A] Wounding Event: [5:Pressure Injury] [N/A:N/A] Primary Etiology: [5:Pressure Ulcer] [N/A:N/A] Comorbid History: [5:Hypertension, Rheumatoid Arthritis, Paraplegia] [N/A:N/A] Date Acquired: [5:11/05/2014] [N/A:N/A] Weeks of Treatment: [5:21] [N/A:N/A] Wound Status: [5:Open] [N/A:N/A] Measurements L x W x D 12.4x8.3x1.8 [N/A:N/A] (cm) Area (cm) : [5:80.833] [N/A:N/A] Volume (cm) : [5:145.5] [N/A:N/A] % Reduction in Area: [5:-17.60%] [N/A:N/A] % Reduction in Volume: 11.80% [N/A:N/A] Classification: [5:Category/Stage III] [N/A:N/A] Exudate Amount: [5:Large] [N/A:N/A] Exudate Type: [5:Serous] [N/A:N/A] Exudate Color: [5:amber] [N/A:N/A] Wound Margin: [5:Thickened] [N/A:N/A] Granulation Amount: [5:Large (67-100%)] [N/A:N/A] Granulation Quality: [5:Red] [N/A:N/A] Necrotic Amount: [5:Small  (1-33%)] [N/A:N/A] Exposed Structures: [5:Fat Layer (Subcutaneous Tissue) Exposed: Yes Fascia: No Tendon: No Muscle: No] [N/A:N/A] Joint: No Bone: No Epithelialization: None N/A N/A Debridement: Debridement [id number] N/A N/A [id number]) Pre-procedure 11:07 N/A N/A Verification/Time Out Taken: Pain Control: Lidocaine 4% Topical N/A N/A Solution Tissue Debrided: Fibrin/Slough, Skin, N/A N/A Subcutaneous Level: Skin/Subcutaneous N/A N/A Tissue Debridement Area (sq 102.92 N/A N/A cm): Instrument: Curette N/A N/A Bleeding: Moderate N/A N/A Hemostasis Achieved: Pressure N/A N/A Procedural Pain: 0 N/A N/A Post Procedural Pain: 0 N/A N/A Debridement Treatment Procedure was tolerated N/A N/A Response: well Post Debridement 12.4x8.3x1.8 N/A N/A Measurements L x W x D (cm) Post Debridement 145.5 N/A N/A Volume: (cm) Post Debridement Category/Stage III N/A N/A Stage:  Periwound Skin Texture: Excoriation: No N/A N/A Induration: No Callus: No Crepitus: No Rash: No Scarring: No Periwound Skin Maceration: Yes N/A N/A Moisture: Dry/Scaly: No Periwound Skin Color: Atrophie Blanche: No N/A N/A Cyanosis: No Ecchymosis: No Erythema: No Hemosiderin Staining: No Mottled: No Pallor: No Rubor: No Temperature: No Abnormality N/A N/A Tenderness on No N/A N/A Palpation: Wound Preparation: N/A N/A Bradley [name], Bradley [name] ([id number]) Ulcer Cleansing: Rinsed/Irrigated with Saline Topical Anesthetic Applied: Other: lidocaine 4% Procedures Performed: Debridement N/A N/A Treatment Notes Wound #5 (Sacrum) 1. Cleansed with: Clean wound with Normal Saline 2. Anesthetic Topical Lidocaine 4% cream to wound bed prior to debridement 4. Dressing Applied: Other dressing (specify in notes) 5. Secondary Dressing Applied ABD Pad 7. Secured with Tape Notes siltec Electronic Signature(s) Signed: 07/27/2016 9:58:44 AM By: Linton Ham MD Entered By: Linton Ham on 07/26/2016 11:54:03 Bradley [name] ([id number]) -------------------------------------------------------------------------------- [name] Patient Name: Bradley [name], Bradley A. Date of Service: 07/26/2016 10:15 AM Medical Record Number: [id number] Patient Account Number: [id number] Date of Birth/Sex: Jan 05, 1946 (71 y.o. Male) Treating RN: Montey Hora Primary Care Liyah Higham: [name] Other Clinician: Referring Harleigh Civello: [name] Treating Cecily Lawhorne/Extender: Tito Dine in Treatment: 21 Active Inactive ` Abuse / Safety / Falls / Self Care Management Nursing Diagnoses: Impaired physical mobility Potential for falls Goals: Patient will remain injury free Date Initiated: 03/01/2016 Target Resolution Date: 07/12/2016 Goal Status: Active Interventions: Assess fall risk on admission and as needed Notes: ` Pressure Nursing Diagnoses: Knowledge deficit related to causes and risk factors for pressure ulcer development Goals: Patient will remain free from development of additional pressure ulcers Date Initiated: 03/01/2016 Target Resolution Date: 07/19/2016 Goal Status: Active Interventions: Provide education on pressure ulcers Notes: ` Wound/Skin Impairment Nursing Diagnoses: Impaired tissue integrity Bradley [name], Bradley [name] ([id number]) Goals: Patient/caregiver will verbalize understanding of skin care regimen Date Initiated: 03/01/2016 Target Resolution Date: 07/05/2016 Goal Status: Active Ulcer/skin breakdown will have a volume reduction of 30% by week 4 Date Initiated: 03/01/2016 Target Resolution Date: 07/12/2016 Goal Status: Active Ulcer/skin breakdown will have a volume reduction of 50% by week 8 Date Initiated: 03/01/2016 Target Resolution Date: 07/12/2016 Goal Status: Active Ulcer/skin breakdown will have a volume reduction of 80% by week 12 Date Initiated: 03/01/2016 Target Resolution Date: 07/12/2016 Goal Status: Active Ulcer/skin breakdown will heal within  14 weeks Date Initiated: 03/01/2016 Target Resolution Date: 07/12/2016 Goal Status: Active Interventions: Assess patient/caregiver ability to obtain necessary supplies Assess patient/caregiver ability to perform ulcer/skin care regimen upon admission and as needed Assess ulceration(s) every visit Notes: Electronic Signature(s) Signed: 07/26/2016 4:52:52 PM By: Montey Hora Entered By: Montey Hora on 07/26/2016 10:42:43 Bradley [name] ([id number]) -------------------------------------------------------------------------------- Pain Assessment [name] Patient Name: Bradley Loa A. Date of Service: 07/26/2016 10:15 AM Medical Record Number: [id number] Patient Account Number: [id number] Date of Birth/Sex: 1946/01/02 (71 y.o. Male) Treating RN: Montey Hora Primary Care Rue Valladares: [name] Other Clinician: Referring Rafia Shedden: [name] Treating Dontrae Morini/Extender: Tito Dine in Treatment: 21 Active Problems Location of Pain Severity and Description of Pain Patient Has Paino No Site Locations Pain Management and Medication Current Pain Management: Notes Topical or injectable lidocaine is offered to patient for acute pain when surgical debridement is performed. If needed, Patient is instructed to use over the counter pain medication for the following 24-48 hours after debridement. Wound care MDs do not prescribed pain medications. Patient has chronic pain or uncontrolled pain. Patient has been instructed to make an appointment with their Primary Care Physician for pain  management. Electronic Signature(s) Signed: 07/26/2016 4:52:52 PM By: Montey Hora Entered By: Montey Hora on 07/26/2016 10:34:47 Bradley [name] ([id number]) -------------------------------------------------------------------------------- Patient/Caregiver Education [name] Patient Name: Bradley [name] Date of Service: 07/26/2016 10:15 AM Medical Record Number:  [id number] Patient Account Number: [id number] Date of Birth/Gender: 1945-08-24 (71 y.o. Male) Treating RN: Montey Hora Primary Care Physician: [name] Other Clinician: Referring Physician: [name] Treating Physician/Extender: Tito Dine in Treatment: 21 Education Assessment Education Provided To: Patient Education Topics Provided Wound/Skin Impairment: Handouts: Other: new wound care as ordered Methods: Demonstration, Explain/Verbal Responses: State content correctly Electronic Signature(s) Signed: 07/26/2016 4:52:52 PM By: Montey Hora Entered By: Montey Hora on 07/26/2016 11:21:45 Bradley [name] ([id number]) -------------------------------------------------------------------------------- Wound Assessment [name] Patient Name: Bradley Loa A. Date of Service: 07/26/2016 10:15 AM Medical Record Number: [id number] Patient Account Number: [id number] Date of Birth/Sex: [birth year] (71 y.o. Male) Treating RN: Montey Hora Primary Care Sylvi Rybolt: [name] Other Clinician: Referring Jocee Kissick: [name] Treating Yann Biehn/Extender: Ricard Dillon Weeks in Treatment: 21 Wound Status Wound Number: 5 Primary Pressure Ulcer Etiology: Wound Location: Sacrum Wound Status: Open Wounding Event: Pressure Injury Comorbid Hypertension, Rheumatoid Arthritis, Date Acquired: 11/05/2014 History: Paraplegia Weeks Of Treatment: 21 Clustered Wound: No Photos Wound Measurements Length: (cm) 12.4 Width: (cm) 8.3 Depth: (cm) 1.8 Area: (cm) 80.833 Volume: (cm) 145.5 % Reduction in Area: -17.6% % Reduction in Volume: 11.8% Epithelialization: None Tunneling: No Undermining: No Wound Description Classification: Category/Stage III Foul Odor Afte Wound Margin: Thickened Exudate Amount: Large Exudate Type: Serous Exudate Color: amber r Cleansing: No Wound Bed Granulation Amount: Large (67-100%) Exposed Structure Granulation  Quality: Red Fascia Exposed: No Necrotic Amount: Small (1-33%) Fat Layer (Subcutaneous Tissue) Exposed: Yes Necrotic Quality: Adherent Slough Tendon Exposed: No Muscle Exposed: No Bradley [name], Bradley A. ([id number]) Joint Exposed: No Bone Exposed: No Periwound Skin Texture Texture Color No Abnormalities Noted: No No Abnormalities Noted: No Callus: No Atrophie Blanche: No Crepitus: No Cyanosis: No Excoriation: No Ecchymosis: No Induration: No Erythema: No Rash: No Hemosiderin Staining: No Scarring: No Mottled: No Pallor: No Moisture Rubor: No No Abnormalities Noted: No Dry / Scaly: No Temperature / Pain Maceration: Yes Temperature: No Abnormality Wound Preparation Ulcer Cleansing: Rinsed/Irrigated with Saline Topical Anesthetic Applied: Other: lidocaine 4%, Treatment Notes Wound #5 (Sacrum) 1. Cleansed with: Clean wound with Normal Saline 2. Anesthetic Topical Lidocaine 4% cream to wound bed prior to debridement 4. Dressing Applied: Other dressing (specify in notes) 5. Secondary Dressing Applied ABD Pad 7. Secured with Tape Notes siltec Electronic Signature(s) Signed: 07/26/2016 4:52:52 PM By: Montey Hora Entered By: Montey Hora on 07/26/2016 12:03:15 Bradley [name] ([id number]) -------------------------------------------------------------------------------- Bradley [name] Patient Name: Bradley Loa A. Date of Service: 07/26/2016 10:15 AM Medical Record Number: [id number] Patient Account Number: [id number] Date of Birth/Sex: 1946-03-05 (71 y.o. Male) Treating RN: Montey Hora Primary Care Madoc Holquin: [name] Other Clinician: Referring Vernal Rutan: [name] Treating Olia Hinderliter/Extender: Ricard Dillon Weeks in Treatment: 21 Vital Signs Time Taken: 10:24 Temperature (F): 98.1 Pulse (bpm): 81 Respiratory Rate (breaths/min): 16 Blood Pressure (mmHg): 107/84 Reference Range: 80 - 120 mg / dl Electronic Signature(s) Signed:  07/26/2016 4:52:52 PM By: Montey Hora Entered By: Montey Hora on 07/26/2016 10:35:12

## 2016-07-27 NOTE — Progress Notes (Signed)
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 for no more than 2-3 hours at which time he transfers to bed and rotates lateral position. 05/10/16; Bronsyn Harrower returns today for review of his stage III sacral pressure ulcer. He denies any concerns over the last 2 weeks although he seems to be running out of Aquacel Ag and on those days he uses Medihoney. [facility] has [facility] was supplying his dressings. He still complains of drainage. He does his dressings independently. He has in his motorized chair for 2-3 hours that time other than that he offloads this. Dimensions of the wound are down 1 cm in both directions. He underwent an aggressive debridement on his last visit of thick circumferential skin and subcutaneous tissue. It is possible at some point in the future he is going to need this done  again 05/24/16; the patient returns today for review of his stage III sacral pressure ulcer. We have been using Aquacel Ag he tells me that he changes this up to twice a day. I'm not really certain of the reason for this frequency of changing. He has some involvement from the home health nurses but I think is doing most of the changing himself which I think because of his paraplegia would be a very difficult exercise. Nevertheless he states that there is "wetness". I am not sure if there is another dressing that we could easily changed that much. I'd wanted to change to [facility] but I'll need to have a sense of how frequent he would need to change this. 06/14/16; this is a patient returns for review of his stage III sacral pressure ulcer. We have been using Aquacel Ag and over the last 2 visits he has had extensive debridement so of the thick circumferential skin and subcutaneous tissue that surrounds this wound. In spite of this really absolutely no change in the condition of the wound warrants measurements. We have [facility] home health I believe changing the dressing on  Bradley [name], Bradley [name] ([id number]) 3 occasions the patient states he does this on one occasion himself 06/28/16; this is a patient who has a fairly large stage III sacral pressure ulcer. I changed him to [facility] from Aquacel 2 weeks ago. He returns today in follow-up. In the meantime a nurse from [facility] has calledrequesting ordering of a wound VAC. He had this discussion before. The problem is the proximity of the lowest edge of this wound to the patient's anal opening roughly 3/4 of an inch. Can't see how this can be arranged. Apparently the nurse who is calling has a lot of experience, the question would be then when she is not available would be doing this. I would not have thought that this wound is not amenable to a wound VAC because of this reason 07/12/16; the patient comes in today and I have signed orders  for a wound VAC. The home health team through [facility] is convinced that he can benefit from this even though there is close proximity to his anal opening beneath the gluteal clefts. The patient does not have a bowel regimen but states he has a bowel movement every 2 days this will also provide some problem with regards to the vac seal 07/26/16; the patient never did obtain a [place] wound VAC as he could not afford the $200 per month co- pay we have been using Hydrofera Blue now for 6 weeks or so. No major change in this wound at all. He is still not interested in the concept of plastic surgery. There changing the dressing every second day Objective Constitutional Sitting or standing Blood Pressure is within target range for patient.. Pulse regular and within target range for patient.Marland Kitchen Respirations regular, non-labored and within target range.. Patient's appearance is neat and clean. Appears in no acute distress. Well nourished and well developed.. Vitals Time Taken: 10:24 AM, Temperature: 98.1 F, Pulse: 81 bpm, Respiratory Rate: 16 breaths/min, Blood Pressure: 107/84 mmHg. General Notes: Wound exam; once again no major change in this substantial area over the sacrum coccyx and surrounding bilateral gluteal areas. He still has thick surrounding subcutaneous tissue which I removed with a #5 curet. Hemostasis with direct pressure. Using an open curet I debrided nonviable tissue over the surface of this wound. Although this looks viable, it has a very gritty feel to it with a curet, I am sure that this part of the issue here. Integumentary (Hair, Skin) Wound #5 status is Open. Original cause of wound was Pressure Injury. The wound is located on the Sacrum. The wound measures 12.4cm length x 8.3cm width x 1.8cm depth; 80.833cm^2 area and 145.5cm^3 volume. There is Fat Layer (Subcutaneous Tissue) Exposed exposed. There is no tunneling or undermining noted. There is a large amount of serous  drainage noted. The wound margin is thickened. There is large (67-100%) red granulation within the wound bed. There is a small (1-33%) amount of necrotic tissue within the wound bed including Adherent Slough. The periwound skin appearance exhibited: Maceration. The periwound skin appearance did not exhibit: Callus, Crepitus, Excoriation, Induration, Rash, Scarring, Dry/Scaly, Atrophie Blanche, Cyanosis, Ecchymosis, Hemosiderin Staining, Mottled, Pallor, Rubor, Erythema. Periwound temperature was noted as No Abnormality. Bradley [name], Bradley [name] ([id number]) Assessment Active Problems ICD-10 L89.153 - Pressure ulcer of sacral region, stage 3 G82.21 - Paraplegia, complete Procedures Wound #5 Wound #5 is a Pressure Ulcer located on the Sacrum . There was a Skin/Subcutaneous Tissue Debridement [id number]) debridement with total area of 102.92 sq cm performed by Linton Ham  G, MD. with the following instrument(s): Curette to remove Viable and Non-Viable tissue/material including Fibrin/Slough, Skin, and Subcutaneous after achieving pain control using Lidocaine 4% Topical Solution. A time out was conducted at 11:07, prior to the start of the procedure. A Moderate amount of bleeding was controlled with Pressure. The procedure was tolerated well with a pain level of 0 throughout and a pain level of 0 following the procedure. Post Debridement Measurements: 12.4cm length x 8.3cm width x 1.8cm depth; 145.5cm^3 volume. Post debridement Stage noted as Category/Stage III. Character of Wound/Ulcer Post Debridement is improved. Severity of Tissue Post Debridement is: Fat layer exposed. Post procedure Diagnosis Wound #5: Same as Pre-Procedure Plan Wound Cleansing: Wound #5 Sacrum: Clean wound with Normal Saline. May Shower, gently pat wound dry prior to applying new dressing. Anesthetic: Wound #5 Sacrum: Topical Lidocaine 4% cream applied to wound bed prior to debridement Skin Barriers/Peri-Wound  Care: Wound #5 Sacrum: Skin Prep Primary Wound Dressing: Wound #5 Sacrum: Bradley [name], Bradley [name] ([id number]) Cutimed Sorbact Secondary Dressing: Wound #5 Sacrum: ABD pad Dry Gauze XtraSorb - HHRN to please provide this for patient Dressing Change Frequency: Wound #5 Sacrum: Change dressing every day. - HHRN to visit 3 times weekly Follow-up Appointments: Wound #5 Sacrum: Return Appointment in 2 weeks. Off-Loading: Wound #5 Sacrum: Roho cushion for wheelchair Mattress - continue air mattress Turn and reposition every 2 hours Additional Orders / Instructions: Wound #5 Sacrum: Increase protein intake. Home Health: Wound #5 Sacrum: [place] Nurse may visit PRN to address patient s wound care needs. FACE TO FACE ENCOUNTER: MEDICARE and MEDICAID PATIENTS: I certify that this patient is under my care and that I had a face-to-face encounter that meets the physician face-to-face encounter requirements with this patient on this date. The encounter with the patient was in whole or in part for the following MEDICAL CONDITION: (primary reason for [place]) MEDICAL NECESSITY: I certify, that based on my findings, NURSING services are a medically necessary home health service. HOME BOUND STATUS: I certify that my clinical findings support that this patient is homebound (i.e., Due to illness or injury, pt requires aid of supportive devices such as crutches, cane, wheelchairs, walkers, the use of special transportation or the assistance of another person to leave their place of residence. There is a normal inability to leave the home and doing so requires considerable and taxing effort. Other absences are for medical reasons / religious services and are infrequent or of short duration when for other reasons). If current dressing causes regression in wound condition, may D/C ordered dressing product/s and apply Normal Saline Moist Dressing daily until next [place] / Other MD appointment. [place] of regression in wound condition at [phone]. Please direct any NON-WOUND related issues/requests for orders to patient's Primary Care Physician Negative Pressure Wound Therapy: Wound #5 Sacrum: Wound VAC settings at 125/130 mmHg continuous pressure. Use BLACK/GREEN foam to wound cavity. Use WHITE foam to fill any tunnel/s and/or undermining. Change VAC dressing 3 X WEEK. Change canister as indicated when full. Nurse may titrate settings and frequency of dressing changes as clinically indicated. - gauze dressing not foam - HHRN please initiate once available Home Health Nurse may d/c VAC for s/s of increased infection, significant wound regression, or uncontrolled drainage. [place] at [phone]. NATHANEAL, ANZELMO ([id number]) o #1 clearly not making any progress with Hydrofera Blue. The wound areas too large to consider Iodoflex. We have used Medihoney  in the past I don't think he could afford Santyl. In my mind this clearly needs further debridement and I am going to go with Sorbact to see if we can get a better surface #21 of our nurses is going to research the wound VAC issue. Perhaps there is a VAC that they prefer rather than a generic $200 per month co-pay Electronic Signature(s) Signed: 07/27/2016 9:58:44 AM By: Linton Ham MD Entered By: Linton Ham on 07/26/2016 12:25:46 Bradley [name] ([id number]) -------------------------------------------------------------------------------- SuperBill Details Patient Name: Bradley Loa A. Date of Service: 07/26/2016 Medical Record Number: [id number] Patient Account Number: [id number] Date of Birth/Sex: 1946-01-01 (71 y.o. Male) Treating RN: Montey Hora Primary Care Provider: [name] Other Clinician: Referring Provider: [name] Treating Provider/Extender: Ricard Dillon Service Line: Outpatient Weeks in Treatment:  21 Diagnosis Coding ICD-10 Codes Code Description L89.153 Pressure ulcer of sacral region, stage 3 G82.21 Paraplegia, complete Facility Procedures CPT4 Code: [id number] Description: 11042 - DEB SUBQ TISSUE 20 SQ CM/< ICD-10 Description Diagnosis L89.153 Pressure ulcer of sacral region, stage 3 Modifier: Quantity: 1 CPT4 Code: [id number] Description: 11045 - DEB SUBQ TISS EA ADDL 20CM ICD-10 Description Diagnosis L89.153 Pressure ulcer of sacral region, stage 3 Modifier: Quantity: 2 Physician Procedures CPT4 Code: [id number] Description: 11042 - WC PHYS SUBQ TISS 20 SQ CM ICD-10 Description Diagnosis L89.153 Pressure ulcer of sacral region, stage 3 Modifier: Quantity: 1 CPT4 Code: [id number] Description: [id number] - WC PHYS SUBQ TISS EA ADDL 20 CM ICD-10 Description Diagnosis L89.153 Pressure ulcer of sacral region, stage 3 Modifier: Quantity: 2 Electronic Signature(s) Signed: 07/27/2016 9:58:44 AM By: Linton Ham MD Entered By: Linton Ham on 07/26/2016 12:26:28 Bradley [name] ([id number])

## 2016-07-28 DIAGNOSIS — G8221 Paraplegia, complete: Secondary | ICD-10-CM | POA: Diagnosis not present

## 2016-07-28 DIAGNOSIS — I10 Essential (primary) hypertension: Secondary | ICD-10-CM | POA: Diagnosis not present

## 2016-07-28 DIAGNOSIS — L8943 Pressure ulcer of contiguous site of back, buttock and hip, stage 3: Secondary | ICD-10-CM | POA: Diagnosis not present

## 2016-07-28 DIAGNOSIS — M069 Rheumatoid arthritis, unspecified: Secondary | ICD-10-CM | POA: Diagnosis not present

## 2016-07-28 DIAGNOSIS — Z4800 Encounter for change or removal of nonsurgical wound dressing: Secondary | ICD-10-CM | POA: Diagnosis not present

## 2016-07-28 DIAGNOSIS — Z87828 Personal history of other (healed) physical injury and trauma: Secondary | ICD-10-CM | POA: Diagnosis not present

## 2016-08-04 DIAGNOSIS — I10 Essential (primary) hypertension: Secondary | ICD-10-CM | POA: Diagnosis not present

## 2016-08-04 DIAGNOSIS — Z87828 Personal history of other (healed) physical injury and trauma: Secondary | ICD-10-CM | POA: Diagnosis not present

## 2016-08-04 DIAGNOSIS — M069 Rheumatoid arthritis, unspecified: Secondary | ICD-10-CM | POA: Diagnosis not present

## 2016-08-04 DIAGNOSIS — Z4800 Encounter for change or removal of nonsurgical wound dressing: Secondary | ICD-10-CM | POA: Diagnosis not present

## 2016-08-04 DIAGNOSIS — L8943 Pressure ulcer of contiguous site of back, buttock and hip, stage 3: Secondary | ICD-10-CM | POA: Diagnosis not present

## 2016-08-04 DIAGNOSIS — G8221 Paraplegia, complete: Secondary | ICD-10-CM | POA: Diagnosis not present

## 2016-08-07 DIAGNOSIS — L8943 Pressure ulcer of contiguous site of back, buttock and hip, stage 3: Secondary | ICD-10-CM | POA: Diagnosis not present

## 2016-08-07 DIAGNOSIS — G8221 Paraplegia, complete: Secondary | ICD-10-CM | POA: Diagnosis not present

## 2016-08-07 DIAGNOSIS — I10 Essential (primary) hypertension: Secondary | ICD-10-CM | POA: Diagnosis not present

## 2016-08-07 DIAGNOSIS — Z4800 Encounter for change or removal of nonsurgical wound dressing: Secondary | ICD-10-CM | POA: Diagnosis not present

## 2016-08-07 DIAGNOSIS — Z87828 Personal history of other (healed) physical injury and trauma: Secondary | ICD-10-CM | POA: Diagnosis not present

## 2016-08-07 DIAGNOSIS — M069 Rheumatoid arthritis, unspecified: Secondary | ICD-10-CM | POA: Diagnosis not present

## 2016-08-09 ENCOUNTER — Encounter: Payer: Medicare HMO | Attending: Internal Medicine | Admitting: Internal Medicine

## 2016-08-09 DIAGNOSIS — L89153 Pressure ulcer of sacral region, stage 3: Secondary | ICD-10-CM | POA: Diagnosis not present

## 2016-08-09 DIAGNOSIS — M069 Rheumatoid arthritis, unspecified: Secondary | ICD-10-CM | POA: Diagnosis not present

## 2016-08-09 DIAGNOSIS — I10 Essential (primary) hypertension: Secondary | ICD-10-CM | POA: Diagnosis not present

## 2016-08-09 DIAGNOSIS — G8221 Paraplegia, complete: Secondary | ICD-10-CM | POA: Diagnosis not present

## 2016-08-10 DIAGNOSIS — M069 Rheumatoid arthritis, unspecified: Secondary | ICD-10-CM | POA: Diagnosis not present

## 2016-08-10 DIAGNOSIS — G8221 Paraplegia, complete: Secondary | ICD-10-CM | POA: Diagnosis not present

## 2016-08-10 DIAGNOSIS — Z4800 Encounter for change or removal of nonsurgical wound dressing: Secondary | ICD-10-CM | POA: Diagnosis not present

## 2016-08-10 DIAGNOSIS — Z87828 Personal history of other (healed) physical injury and trauma: Secondary | ICD-10-CM | POA: Diagnosis not present

## 2016-08-10 DIAGNOSIS — I10 Essential (primary) hypertension: Secondary | ICD-10-CM | POA: Diagnosis not present

## 2016-08-10 DIAGNOSIS — L8943 Pressure ulcer of contiguous site of back, buttock and hip, stage 3: Secondary | ICD-10-CM | POA: Diagnosis not present

## 2016-08-10 NOTE — Progress Notes (Signed)
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 motorized chair for 2-3 hours that time other than that he offloads this. Dimensions of the wound are down 1 cm in both directions. He underwent an aggressive debridement on his last visit of thick circumferential skin and subcutaneous tissue. It is possible at some point in the future he is going to need this done again 05/24/16; the patient returns today for review of his stage III sacral pressure ulcer. We have been using Aquacel Ag he tells me that he changes this up to twice a day. I'm not really certain of the reason for this frequency of changing. He has some involvement from the home health nurses but I think is doing most of the changing himself which I think because of his paraplegia would be a very difficult  exercise. Nevertheless he states that there is "wetness". I am not sure if there is another dressing that we could easily changed that much. I'd wanted to change to [facility] but I'll need to have a sense of how frequent he would need to change this. 06/14/16; this is a patient returns for review of his stage III sacral pressure ulcer. We have been using Aquacel Ag and over the last 2 visits he has had extensive debridement so of the thick circumferential skin and subcutaneous tissue that surrounds this wound. In spite of this really absolutely no change in the condition of the wound warrants measurements. We have [facility] home [facility] I believe changing the dressing on BLAYDE, BACIGALUPI. ([id number]) 3 occasions the patient states he does this on one occasion himself 06/28/16; this is a patient who has a fairly large stage III sacral pressure ulcer. I changed him to [facility] from Aquacel 2 weeks ago. He returns today in follow-up. In the meantime a nurse from [facility] has calledrequesting ordering of a wound VAC. He had  this discussion before. The problem is the proximity of the lowest edge of this wound to the patient's anal opening roughly 3/4 of an inch. Can't see how this can be arranged. Apparently the nurse who is calling has a lot of experience, the question would be then when she is not available would be doing this. I would not have thought that this wound is not amenable to a wound VAC because of this reason 07/12/16; the patient comes in today and I have signed orders for a wound VAC. The home health team through [facility] is convinced that he can benefit from this even though there is close proximity to his anal opening beneath the gluteal clefts. The patient does not have a bowel regimen but states he has a bowel movement every 2 days this will also provide some problem with regards to the vac seal 07/26/16; the patient never did obtain a [place] wound VAC as he could not  afford the $200 per month co- pay we have been using Hydrofera Blue now for 6 weeks or so. No major change in this wound at all. He is still not interested in the concept of plastic surgery. There changing the dressing every second day 08/09/16; the patient arrives with a wound precisely in the same situation. In keeping with the plan I outlined last time extensive debridement with an open curet the surface of this is not completely viable. Still has some degree of surrounding thick skin and subcutaneous tissue. No evidence of infection. Once again I have had a conversation with him about plastic surgery, he is simply not interested. Objective Constitutional Sitting or standing Blood Pressure is within target range for patient.. Pulse regular and within target range for patient.Marland Kitchen Respirations regular, non-labored and within target range.. Temperature is normal and within the target range for the patient.. Patient's appearance is neat and clean. Appears in no acute distress. Well nourished and well developed.. Vitals Time Taken: 10:05 AM, Temperature: 98.2 F, Pulse: 88 bpm, Respiratory Rate: 16 breaths/min, Blood Pressure: 101/45 mmHg. General Notes: Wound exam; once again no change in any part of this wound dimensions, visible appearance. Using an open curette extensive debridement of the surface and there is nonviable tissue air. No epithelialization is seen unfortunately Integumentary (Hair, Skin) Wound #5 status is Open. Original cause of wound was Pressure Injury. The wound is located on the Sacrum. The wound measures 7.5cm length x 11cm width x 0.4cm depth; 64.795cm^2 area and 25.918cm^3 volume. There is Fat Layer (Subcutaneous Tissue) Exposed exposed. There is no tunneling or undermining noted. There is a large amount of serous drainage noted. The wound margin is thickened. There is large (67-100%) red granulation within the wound bed. There is a small (1-33%) amount of necrotic tissue  within the wound bed including Adherent Slough. The periwound skin appearance exhibited: Excoriation, Scarring. The periwound skin appearance did not exhibit: Callus, Crepitus, Induration, Rash, SHO, SALGUERO A. ([id number]) Dry/Scaly, Maceration, Atrophie Blanche, Cyanosis, Ecchymosis, Hemosiderin Staining, Mottled, Pallor, Rubor, Erythema. Periwound temperature was noted as No Abnormality. Assessment Active Problems ICD-10 L89.153 - Pressure ulcer of sacral region, stage 3 G82.21 - Paraplegia, complete Procedures Wound #5 Wound #5 is a Pressure Ulcer located on the Sacrum . There was a Skin/Subcutaneous Tissue Debridement ([id number]) debridement with total area of 82.5 sq cm performed by Ricard Dillon, MD. with the following instrument(s): Curette to remove Viable and Non-Viable tissue/material including Fibrin/Slough and Subcutaneous. A time out was conducted at 10:28, prior to the start  of the procedure. A Moderate amount of bleeding was controlled with Pressure. The procedure was tolerated well with a pain level of 0 throughout and a pain level of 0 following the procedure. Post Debridement Measurements: 7.5cm length x 11cm width x 0.4cm depth; 25.918cm^3 volume. Post debridement Stage noted as Category/Stage III. Character of Wound/Ulcer Post Debridement is improved. Severity of Tissue Post Debridement is: Fat layer exposed. Post procedure Diagnosis Wound #5: Same as Pre-Procedure Plan Wound Cleansing: Wound #5 Sacrum: Clean wound with Normal Saline. May Shower, gently pat wound dry prior to applying new dressing. Anesthetic: Wound #5 Sacrum: Topical Lidocaine 4% cream applied to wound bed prior to debridement Skin Barriers/Peri-Wound Care: KASHTYN, JANKOWSKI ([id number]) Wound #5 Sacrum: Skin Prep Primary Wound Dressing: Wound #5 Sacrum: Cutimed Sorbact Secondary Dressing: Wound #5 Sacrum: ABD pad Dry Gauze XtraSorb - HHRN to please provide this for  patient Dressing Change Frequency: Wound #5 Sacrum: Change dressing every day. - HHRN to visit 3 times weekly Follow-up Appointments: Wound #5 Sacrum: Return Appointment in 2 weeks. Off-Loading: Wound #5 Sacrum: Roho cushion for wheelchair Mattress - continue air mattress Turn and reposition every 2 hours Additional Orders / Instructions: Wound #5 Sacrum: Increase protein intake. Home Health: Wound #5 Sacrum: [place] Nurse may visit PRN to address patient s wound care needs. FACE TO FACE ENCOUNTER: MEDICARE and MEDICAID PATIENTS: I certify that this patient is under my care and that I had a face-to-face encounter that meets the physician face-to-face encounter requirements with this patient on this date. The encounter with the patient was in whole or in part for the following MEDICAL CONDITION: (primary reason for [place]) MEDICAL NECESSITY: I certify, that based on my findings, NURSING services are a medically necessary home health service. HOME BOUND STATUS: I certify that my clinical findings support that this patient is homebound (i.e., Due to illness or injury, pt requires aid of supportive devices such as crutches, cane, wheelchairs, walkers, the use of special transportation or the assistance of another person to leave their place of residence. There is a normal inability to leave the home and doing so requires considerable and taxing effort. Other absences are for medical reasons / religious services and are infrequent or of short duration when for other reasons). If current dressing causes regression in wound condition, may D/C ordered dressing product/s and apply Normal Saline Moist Dressing daily until next [place] / Other MD appointment. [place] of regression in wound condition at [phone]. Please direct any NON-WOUND related issues/requests for orders to patient's Primary Care Physician Continue  with sorbact that i started last time YEE, JOSS ([id number]) He is NOT interested in an any attempt at plastic surgery closure Electronic Signature(s) Signed: 08/10/2016 8:02:19 AM By: Linton Ham MD Entered By: Linton Ham on 08/09/2016 12:43:14 [name] ([id number]) -------------------------------------------------------------------------------- [place] Details Patient Name: Bradley Loa A. Date of Service: 08/09/2016 Medical Record Number: [id number] Patient Account Number: [id number] Date of Birth/Sex: 19-Nov-1945 (71 y.o. Male) Treating RN: Montey Hora Primary Care Provider: Dion [name] Other Clinician: Referring Provider: Dion [name] Treating Provider/Extender: Ricard Dillon Service Line: Outpatient Weeks in Treatment: 23 Diagnosis Coding ICD-10 Codes Code Description L89.153 Pressure ulcer of sacral region, stage 3 G82.21 Paraplegia, complete Facility Procedures CPT4 Code: [id number] Description: 03559 - DEB SUBQ TISSUE 20 SQ CM/< ICD-10 Description Diagnosis L89.153 Pressure ulcer of sacral region, stage 3 Modifier: Quantity: 1 CPT4 Code: [id number] Description: [place]  TISS EA ADDL 20CM ICD-10 Description Diagnosis L89.153 Pressure ulcer of sacral region, stage 3 Modifier: Quantity: 2 Physician Procedures CPT4 Code: 9753005 Description: 11042 - WC PHYS SUBQ TISS 20 SQ CM ICD-10 Description Diagnosis L89.153 Pressure ulcer of sacral region, stage 3 Modifier: Quantity: 1 CPT4 Code: 1102111 Description: 73567 - WC PHYS SUBQ TISS EA ADDL 20 CM ICD-10 Description Diagnosis L89.153 Pressure ulcer of sacral region, stage 3 Modifier: Quantity: 2 Electronic Signature(s) Signed: 08/10/2016 8:02:19 AM By: Linton Ham MD Entered By: Linton Ham on 08/09/2016 12:43:47 [name] ([id number])

## 2016-08-11 NOTE — Progress Notes (Signed)
BLEASE, CAPALDI ([id number]) Visit Report for 08/09/2016 Arrival Information Details Patient Name: KIM, LAUVER. Date of Service: 08/09/2016 10:15 AM Medical Record Number: [id number] Patient Account Number: [id number] Date of Birth/Sex: 1945/08/06 (71 y.o. Male) Treating RN: Cornell Barman Primary Care Jaionna Weisse: [name] Other Clinician: Referring Shalissa Easterwood: [name] Treating Fleming Prill/Extender: Tito Dine in Treatment: 23 Visit Information History Since Last Visit Added or deleted any medications: No Patient Arrived: Wheel Chair Any new allergies or adverse reactions: No Arrival Time: 10:03 Had a fall or experienced change in No activities of daily living that may affect Accompanied By: self risk of falls: Transfer Assistance: Hoyer Lift Signs or symptoms of abuse/neglect since last No Patient Identification Verified: Yes visito Secondary Verification Process Yes Hospitalized since last visit: No Completed: Has Dressing in Place as Prescribed: Yes Patient Requires Transmission-Based No Pain Present Now: No Precautions: Patient Has Alerts: No Electronic Signature(s) Signed: 08/10/2016 12:55:26 PM By: Gretta Cool, RN, BSN, Kim RN, BSN Entered By: Gretta Cool, RN, BSN, Kim on 08/09/2016 10:05:06 [name] ([id number]) -------------------------------------------------------------------------------- Encounter Discharge Information Details Patient Name: [name] Date of Service: 08/09/2016 10:15 AM Medical Record Number: [id number] Patient Account Number: [id number] Date of Birth/Sex: 1945-06-15 (71 y.o. Male) Treating RN: Montey Hora Primary Care Deundre Thong: [name] Other Clinician: Referring Mili Piltz: [name] Treating Jonatha Gagen/Extender: Tito Dine in Treatment: 84 Encounter Discharge Information Items Discharge Pain Level: 0 Discharge Condition: Stable Ambulatory Status: Wheelchair Discharge Destination:  Home Transportation: Private Auto Accompanied By: self Schedule Follow-up Appointment: Yes Medication Reconciliation completed No and provided to Patient/Care Tawania Daponte: Provided on Clinical Summary of Care: 08/09/2016 Form Type Recipient Paper Patient JG Electronic Signature(s) Signed: 08/09/2016 10:59:32 AM By: Ruthine Dose Entered By: Ruthine Dose on 08/09/2016 10:59:32 [name] ([id number]) -------------------------------------------------------------------------------- Lower Extremity Assessment Details Patient Name: [name] Date of Service: 08/09/2016 10:15 AM Medical Record Number: [id number] Patient Account Number: [id number] Date of Birth/Sex: Sep 19, 1945 (71 y.o. Male) Treating RN: Cornell Barman Primary Care Arvetta Araque: [name] Other Clinician: Referring Keshia Weare: [name] Treating Janeah Kovacich/Extender: Tito Dine in Treatment: 23 Electronic Signature(s) Signed: 08/10/2016 12:55:26 PM By: Gretta Cool, RN, BSN, Kim RN, BSN Entered By: Gretta Cool, RN, BSN, Kim on 08/09/2016 10:16:16 [name] ([id number]) -------------------------------------------------------------------------------- Multi Wound Chart Details Patient Name: NAZIM, KADLEC A. Date of Service: 08/09/2016 10:15 AM Medical Record Number: [id number] Patient Account Number: [id number] Date of Birth/Sex: 1945-11-21 (71 y.o. Male) Treating RN: Montey Hora Primary Care Sumiya Mamaril: [name] Other Clinician: Referring Moyses Pavey: [name] Treating Yamira Papa/Extender: Tito Dine in Treatment: 23 Vital Signs Height(in): Pulse(bpm): 88 Weight(lbs): Blood Pressure 101/45 (mmHg): Body Mass Index(BMI): Temperature(F): 98.2 Respiratory Rate 16 (breaths/min): Photos: [N/A:N/A] Wound Location: Sacrum N/A N/A Wounding Event: Pressure Injury N/A N/A Primary Etiology: Pressure Ulcer N/A N/A Comorbid History: Hypertension, N/A N/A Rheumatoid  Arthritis, Paraplegia Date Acquired: 11/05/2014 N/A N/A Weeks of Treatment: 23 N/A N/A Wound Status: Open N/A N/A Measurements L x W x D 7.5x11x0.4 N/A N/A (cm) Area (cm) : 64.795 N/A N/A Volume (cm) : 25.918 N/A N/A % Reduction in Area: 5.70% N/A N/A % Reduction in Volume: 84.30% N/A N/A Classification: Category/Stage III N/A N/A Exudate Amount: Large N/A N/A Exudate Type: Serous N/A N/A Exudate Color: amber N/A N/A Wound Margin: Thickened N/A N/A Granulation Amount: Large (67-100%) N/A N/A Granulation Quality: Red N/A N/A Necrotic Amount: Small (1-33%) N/A N/A Exposed Structures: N/A N/A KARSEN, NAKANISHI A. ([id number]) Fat Layer (Subcutaneous Tissue) Exposed: Yes Fascia: No Tendon:  No Muscle: No Joint: No Bone: No Epithelialization: Medium (34-66%) N/A N/A Debridement: Debridement (16010- N/A N/A 11047) Pre-procedure 10:28 N/A N/A Verification/Time Out Taken: Tissue Debrided: Fibrin/Slough, N/A N/A Subcutaneous Level: Skin/Subcutaneous N/A N/A Tissue Debridement Area (sq 82.5 N/A N/A cm): Instrument: Curette N/A N/A Bleeding: Moderate N/A N/A Hemostasis Achieved: Pressure N/A N/A Procedural Pain: 0 N/A N/A Post Procedural Pain: 0 N/A N/A Debridement Treatment Procedure was tolerated N/A N/A Response: well Post Debridement 7.5x11x0.4 N/A N/A Measurements L x W x D (cm) Post Debridement 25.918 N/A N/A Volume: (cm) Post Debridement Category/Stage III N/A N/A Stage: Periwound Skin Texture: Excoriation: Yes N/A N/A Scarring: Yes Induration: No Callus: No Crepitus: No Rash: No Periwound Skin Maceration: No N/A N/A Moisture: Dry/Scaly: No Periwound Skin Color: Atrophie Blanche: No N/A N/A Cyanosis: No Ecchymosis: No Erythema: No Hemosiderin Staining: No Mottled: No Pallor: No Rubor: No Temperature: No Abnormality N/A N/A EDSEL, SHIVES A. ([id number]) Tenderness on No N/A N/A Palpation: Wound Preparation: Ulcer Cleansing: N/A N/A Rinsed/Irrigated  with Saline Topical Anesthetic Applied: None Procedures Performed: Debridement N/A N/A Treatment Notes Wound #5 (Sacrum) 1. Cleansed with: Clean wound with Normal Saline 4. Dressing Applied: Other dressing (specify in notes) 5. Secondary Dressing Applied ABD Pad 7. Secured with Tape Notes [employer]tec Electronic Signature(s) Signed: 08/10/2016 8:02:19 AM By: Linton Ham MD Entered By: Linton Ham on 08/09/2016 12:34:09 [name] ([id number]) -------------------------------------------------------------------------------- [place] Details Patient Name: NIKOLI, NASSER A. Date of Service: 08/09/2016 10:15 AM Medical Record Number: [id number] Patient Account Number: [id number] Date of Birth/Sex: 10/17/1945 (71 y.o. Male) Treating RN: Montey Hora Primary Care Edrees Valent: [name] Other Clinician: Referring Daron Stutz: [name] Treating [place]/Extender: Tito Dine in Treatment: 35 Active Inactive ` Abuse / Safety / Falls / Self Care Management Nursing Diagnoses: Impaired physical mobility Potential for falls Goals: Patient will remain injury free Date Initiated: 03/01/2016 Target Resolution Date: 07/12/2016 Goal Status: Active Interventions: Assess fall risk on admission and as needed Notes: ` Pressure Nursing Diagnoses: Knowledge deficit related to causes and risk factors for pressure ulcer development Goals: Patient will remain free from development of additional pressure ulcers Date Initiated: 03/01/2016 Target Resolution Date: 07/19/2016 Goal Status: Active Interventions: Provide education on pressure ulcers Notes: ` Wound/Skin Impairment Nursing Diagnoses: Impaired tissue integrity JOHNEDWARD, BRODRICK ([id number]) Goals: Patient/caregiver will verbalize understanding of skin care regimen Date Initiated: 03/01/2016 Target Resolution Date: 07/05/2016 Goal Status: Active Ulcer/skin breakdown will have a  volume reduction of 30% by week 4 Date Initiated: 03/01/2016 Target Resolution Date: 07/12/2016 Goal Status: Active Ulcer/skin breakdown will have a volume reduction of 50% by week 8 Date Initiated: 03/01/2016 Target Resolution Date: 07/12/2016 Goal Status: Active Ulcer/skin breakdown will have a volume reduction of 80% by week 12 Date Initiated: 03/01/2016 Target Resolution Date: 07/12/2016 Goal Status: Active Ulcer/skin breakdown will heal within 14 weeks Date Initiated: 03/01/2016 Target Resolution Date: 07/12/2016 Goal Status: Active Interventions: Assess patient/caregiver ability to obtain necessary supplies Assess patient/caregiver ability to perform ulcer/skin care regimen upon admission and as needed Assess ulceration(s) every visit Notes: Electronic Signature(s) Signed: 08/09/2016 4:54:24 PM By: Montey Hora Entered By: Montey Hora on 08/09/2016 10:26:56 [name] ([id number]) -------------------------------------------------------------------------------- Pain Assessment Details Patient Name: [name] Date of Service: 08/09/2016 10:15 AM Medical Record Number: [id number] Patient Account Number: [id number] Date of Birth/Sex: 1946-01-09 (71 y.o. Male) Treating RN: Cornell Barman Primary Care Jazion Atteberry: [name] Other Clinician: Referring Neilah Fulwider: [name] Treating Chairty Toman/Extender: Ricard Dillon Weeks in Treatment: 39 Active  Problems Location of Pain Severity and Description of Pain Patient Has Paino No Site Locations With Dressing Change: No Pain Management and Medication Current Pain Management: Notes Topical or injectable lidocaine is offered to patient for acute pain when surgical debridement is performed. If needed, Patient is instructed to use over the counter pain medication for the following 24-48 hours after debridement. Wound care MDs do not prescribed pain medications. Patient has chronic pain or uncontrolled pain. Patient has  been instructed to make an appointment with their Primary Care Physician for pain management. Electronic Signature(s) Signed: 08/10/2016 12:55:26 PM By: Gretta Cool, RN, BSN, Kim RN, BSN Entered By: Gretta Cool, RN, BSN, Kim on 08/09/2016 10:24:08 [name] ([id number]) -------------------------------------------------------------------------------- Patient/Caregiver Education Details Patient Name: MOHAMMED, MCANDREW A. Date of Service: 08/09/2016 10:15 AM Medical Record Number: [id number] Patient Account Number: [id number] Date of Birth/Gender: Jul 16, 1945 (71 y.o. Male) Treating RN: Montey Hora Primary Care Physician: [name] Other Clinician: Referring Physician: [name] Treating Physician/Extender: Tito Dine in Treatment: 11 Education Assessment Education Provided To: Patient Education Topics Provided Wound/Skin Impairment: Handouts: Other: wound care as ordered Methods: Demonstration, Explain/Verbal Responses: State content correctly Electronic Signature(s) Signed: 08/09/2016 4:54:24 PM By: Montey Hora Entered By: Montey Hora on 08/09/2016 10:32:34 [name] ([id number]) -------------------------------------------------------------------------------- Wound Assessment Details Patient Name: [name] Date of Service: 08/09/2016 10:15 AM Medical Record Number: [id number] Patient Account Number: [id number] Date of Birth/Sex: 1945/10/07 (71 y.o. Male) Treating RN: Cornell Barman Primary Care Ines Rebel: [name] Other Clinician: Referring Abimael Zeiter: [name] Treating Adiel Erney/Extender: Ricard Dillon Weeks in Treatment: 23 Wound Status Wound Number: 5 Primary Pressure Ulcer Etiology: Wound Location: Sacrum Wound Status: Open Wounding Event: Pressure Injury Comorbid Hypertension, Rheumatoid Arthritis, Date Acquired: 11/05/2014 History: Paraplegia Weeks Of Treatment: 23 Clustered Wound: No Photos Photo Uploaded By:  Gretta Cool, RN, BSN, Kim on 08/09/2016 10:24:32 Wound Measurements Length: (cm) 7.5 Width: (cm) 11 Depth: (cm) 0.4 Area: (cm) 64.795 Volume: (cm) 25.918 % Reduction in Area: 5.7% % Reduction in Volume: 84.3% Epithelialization: Medium (34-66%) Tunneling: No Undermining: No Wound Description Classification: Category/Stage III Foul Odor Afte Wound Margin: Thickened Exudate Amount: Large Exudate Type: Serous Exudate Color: amber r Cleansing: No Wound Bed Granulation Amount: Large (67-100%) Exposed Structure Granulation Quality: Red Fascia Exposed: No Necrotic Amount: Small (1-33%) Fat Layer (Subcutaneous Tissue) Exposed: Yes Necrotic Quality: Adherent Slough Tendon Exposed: No Muscle Exposed: No Joint Exposed: No Bone Exposed: No JOHNATAN, BASKETTE A. ([id number]) Periwound Skin Texture Texture Color No Abnormalities Noted: No No Abnormalities Noted: No Callus: No Atrophie Blanche: No Crepitus: No Cyanosis: No Excoriation: Yes Ecchymosis: No Induration: No Erythema: No Rash: No Hemosiderin Staining: No Scarring: Yes Mottled: No Pallor: No Moisture Rubor: No No Abnormalities Noted: No Dry / Scaly: No Temperature / Pain Maceration: No Temperature: No Abnormality Wound Preparation Ulcer Cleansing: Rinsed/Irrigated with Saline Topical Anesthetic Applied: None Treatment Notes Wound #5 (Sacrum) 1. Cleansed with: Clean wound with Normal Saline 4. Dressing Applied: Other dressing (specify in notes) 5. Secondary Dressing Applied ABD Pad 7. Secured with Tape Notes Psychologist, forensic) Signed: 08/10/2016 12:55:26 PM By: Gretta Cool, RN, BSN, Kim RN, BSN Entered By: Gretta Cool, RN, BSN, Kim on 08/09/2016 10:16:07 [name] ([id number]) -------------------------------------------------------------------------------- [place] Details Patient Name: SHEAN, GERDING A. Date of Service: 08/09/2016 10:15 AM Medical Record Number: [id number] Patient Account Number:  [id number] Date of Birth/Sex: 1946/01/31 (71 y.o. Male) Treating RN: Cornell Barman Primary Care Deigo Alonso: [name] Other Clinician: Referring Kealy Lewter: [name] Treating [place] Harral/Extender: Ricard Dillon  Weeks in Treatment: 23 Vital Signs Time Taken: 10:05 Temperature (F): 98.2 Pulse (bpm): 88 Respiratory Rate (breaths/min): 16 Blood Pressure (mmHg): 101/45 Reference Range: 80 - 120 mg / dl Electronic Signature(s) Signed: 08/10/2016 12:55:26 PM By: Gretta Cool, RN, BSN, Kim RN, BSN Entered By: Gretta Cool, RN, BSN, Kim on 08/09/2016 10:05:54

## 2016-08-17 DIAGNOSIS — M069 Rheumatoid arthritis, unspecified: Secondary | ICD-10-CM | POA: Diagnosis not present

## 2016-08-17 DIAGNOSIS — I10 Essential (primary) hypertension: Secondary | ICD-10-CM | POA: Diagnosis not present

## 2016-08-17 DIAGNOSIS — Z87828 Personal history of other (healed) physical injury and trauma: Secondary | ICD-10-CM | POA: Diagnosis not present

## 2016-08-17 DIAGNOSIS — Z4800 Encounter for change or removal of nonsurgical wound dressing: Secondary | ICD-10-CM | POA: Diagnosis not present

## 2016-08-17 DIAGNOSIS — L8943 Pressure ulcer of contiguous site of back, buttock and hip, stage 3: Secondary | ICD-10-CM | POA: Diagnosis not present

## 2016-08-17 DIAGNOSIS — G8221 Paraplegia, complete: Secondary | ICD-10-CM | POA: Diagnosis not present

## 2016-08-23 ENCOUNTER — Encounter: Payer: Medicare HMO | Admitting: Internal Medicine

## 2016-08-23 DIAGNOSIS — G8221 Paraplegia, complete: Secondary | ICD-10-CM | POA: Diagnosis not present

## 2016-08-23 DIAGNOSIS — L89153 Pressure ulcer of sacral region, stage 3: Secondary | ICD-10-CM | POA: Diagnosis not present

## 2016-08-23 DIAGNOSIS — I10 Essential (primary) hypertension: Secondary | ICD-10-CM | POA: Diagnosis not present

## 2016-08-23 DIAGNOSIS — M069 Rheumatoid arthritis, unspecified: Secondary | ICD-10-CM | POA: Diagnosis not present

## 2016-08-24 NOTE — Progress Notes (Signed)
Bradley, [name] ([id number]) Visit Report for 08/23/2016 Arrival Information Details Patient Name: Bradley [name], Bradley [name]. Date of Service: 08/23/2016 10:15 AM Medical Record Number: [id number] Patient Account Number: [id number] Date of Birth/Sex: August 12, 1945 (71 y.o. Male) Treating RN: Montey Hora Primary Care Nevena Rozenberg: [name] Other Clinician: Referring Dafne Nield: [name] Treating Laurent Cargile/Extender: Tito Dine in Treatment: 25 Visit Information History Since Last Visit Added or deleted any medications: No Patient Arrived: Wheel Chair Any new allergies or adverse reactions: No Arrival Time: 10:34 Had a fall or experienced change in No activities of daily living that may affect Accompanied By: self risk of falls: Transfer Assistance: Hoyer Lift Signs or symptoms of abuse/neglect since last No Patient Identification Verified: Yes visito Secondary Verification Process Yes Hospitalized since last visit: No Completed: Has Dressing in Place as Prescribed: Yes Patient Requires Transmission-Based No Pain Present Now: No Precautions: Patient Has Alerts: No Electronic Signature(s) Signed: 08/23/2016 4:10:27 PM By: Montey Hora Entered By: Montey Hora on 08/23/2016 10:34:32 [name] ([id number]) -------------------------------------------------------------------------------- Encounter Discharge Information Details Patient Name: Bradley Loa A. Date of Service: 08/23/2016 10:15 AM Medical Record Number: [id number] Patient Account Number: [id number] Date of Birth/Sex: 1946-05-15 (71 y.o. Male) Treating RN: Montey Hora Primary Care Muhamad Serano: [name] Other Clinician: Referring Janisa Labus: [name] Treating Flois Mctague/Extender: Tito Dine in Treatment: 25 Encounter Discharge Information Items Discharge Pain Level: 0 Discharge Condition: Stable Ambulatory Status: Wheelchair Discharge Destination:  Home Transportation: Private Auto Accompanied By: self Schedule Follow-up Appointment: Yes Medication Reconciliation completed and provided to Patient/Care No Tyrena Gohr: Provided on Clinical Summary of Care: 08/23/2016 Form Type Recipient Paper Patient JG Electronic Signature(s) Signed: 08/23/2016 11:39:21 AM By: Ruthine Dose Entered By: Ruthine Dose on 08/23/2016 11:39:20 [name] ([id number]) -------------------------------------------------------------------------------- Lower Extremity Assessment Details Patient Name: Bradley Loa A. Date of Service: 08/23/2016 10:15 AM Medical Record Number: [id number] Patient Account Number: [id number] Date of Birth/Sex: 12-Oct-1945 (71 y.o. Male) Treating RN: Montey Hora Primary Care Zakir Henner: [name] Other Clinician: Referring Kaliah Haddaway: [name] Treating Oisin Yoakum/Extender: Ricard Dillon Weeks in Treatment: 25 Electronic Signature(s) Signed: 08/23/2016 4:10:27 PM By: Montey Hora Entered By: Montey Hora on 08/23/2016 10:39:46 [name] ([id number]) -------------------------------------------------------------------------------- Multi Wound Chart Details Patient Name: Bradley, [name] A. Date of Service: 08/23/2016 10:15 AM Medical Record Number: [id number] Patient Account Number: [id number] Date of Birth/Sex: 1945/12/05 (71 y.o. Male) Treating RN: Montey Hora Primary Care Angles Trevizo: [name] Other Clinician: Referring Tashima Scarpulla: [name] Treating Ellen Goris/Extender: Tito Dine in Treatment: 25 Vital Signs Height(in): Pulse(bpm): 81 Weight(lbs): Blood Pressure 120/63 (mmHg): Body Mass Index(BMI): Temperature(F): 97.6 Respiratory Rate 18 (breaths/min): Photos: [5:No Photos] [N/A:N/A] Wound Location: [5:Sacrum] [N/A:N/A] Wounding Event: [5:Pressure Injury] [N/A:N/A] Primary Etiology: [5:Pressure Ulcer] [N/A:N/A] Comorbid History: [5:Hypertension,  Rheumatoid Arthritis, Paraplegia] [N/A:N/A] Date Acquired: [5:11/05/2014] [N/A:N/A] Weeks of Treatment: [5:25] [N/A:N/A] Wound Status: [5:Open] [N/A:N/A] Measurements L x W x D 10.5x8.5x0.4 [N/A:N/A] (cm) Area (cm) : [5:70.097] [N/A:N/A] Volume (cm) : [5:28.039] [N/A:N/A] % Reduction in Area: [5:-2.00%] [N/A:N/A] % Reduction in Volume: 83.00% [N/A:N/A] Classification: [5:Category/Stage III] [N/A:N/A] Exudate Amount: [5:Large] [N/A:N/A] Exudate Type: [5:Serous] [N/A:N/A] Exudate Color: [5:amber] [N/A:N/A] Wound Margin: [5:Thickened] [N/A:N/A] Granulation Amount: [5:Large (67-100%)] [N/A:N/A] Granulation Quality: [5:Red] [N/A:N/A] Necrotic Amount: [5:Small (1-33%)] [N/A:N/A] Exposed Structures: [5:Fat Layer (Subcutaneous Tissue) Exposed: Yes Fascia: No Tendon: No Muscle: No] [N/A:N/A] Joint: No Bone: No Epithelialization: None N/A N/A Debridement: Debridement ([id number]- N/A N/A 11047) Pre-procedure 10:57 N/A N/A Verification/Time Out Taken: Pain Control: Lidocaine 4% Topical N/A N/A Solution Tissue Debrided: Fibrin/Slough,  Skin N/A N/A Level: Skin/Subcutaneous N/A N/A Tissue Debridement Area (sq 25 N/A N/A cm): Instrument: Blade, Curette, Forceps N/A N/A Bleeding: Moderate N/A N/A Hemostasis Achieved: Silver Nitrate N/A N/A Procedural Pain: 0 N/A N/A Post Procedural Pain: 0 N/A N/A Debridement Treatment Procedure was tolerated N/A N/A Response: well Post Debridement 10.5x8.5x0.4 N/A N/A Measurements L x W x D (cm) Post Debridement 28.039 N/A N/A Volume: (cm) Post Debridement Category/Stage III N/A N/A Stage: Periwound Skin Texture: Scarring: Yes N/A N/A Excoriation: No Induration: No Callus: No Crepitus: No Rash: No Periwound Skin Maceration: Yes N/A N/A Moisture: Dry/Scaly: No Periwound Skin Color: Atrophie Blanche: No N/A N/A Cyanosis: No Ecchymosis: No Erythema: No Hemosiderin Staining: No Mottled: No Pallor: No Rubor: No Temperature: No Abnormality  N/A N/A Tenderness on No N/A N/A Palpation: Wound Preparation: Ulcer Cleansing: N/A N/A Rinsed/Irrigated with JESSI, JESSOP AMarland Kitchen ([id number]) Saline Topical Anesthetic Applied: Other: idocaine 4% Procedures Performed: Debridement N/A N/A Treatment Notes Wound #5 (Sacrum) 1. Cleansed with: Clean wound with Normal Saline 2. Anesthetic Topical Lidocaine 4% cream to wound bed prior to debridement 4. Dressing Applied: Other dressing (specify in notes) 5. Secondary Dressing Applied ABD Pad 7. 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 Date of Service: 08/23/2016 10:15 AM Medical Record Number: [id number] Patient Account Number: [id number] Date of Birth/Sex: 01/02/1946 (71 y.o. Male) Treating RN: Montey Hora Primary Care Blayze Haen: [name] Other Clinician: Referring Calyn Rubi: [name] Treating Hendrix Console/Extender: Tito Dine in Treatment: 25 Active Inactive ` Abuse / Safety / Falls / Self Care Management Nursing Diagnoses: Impaired physical mobility Potential for falls Goals: Patient will remain injury free Date Initiated: 03/01/2016 Target Resolution Date: 07/12/2016 Goal Status: Active Interventions: Assess fall risk on admission and as needed Notes: ` Pressure Nursing Diagnoses: Knowledge deficit related to causes and risk factors for pressure ulcer development Goals: Patient will remain free from development of additional pressure ulcers Date Initiated: 03/01/2016 Target Resolution Date: 07/19/2016 Goal Status: Active Interventions: Provide education on pressure ulcers Notes: ` Wound/Skin Impairment Nursing Diagnoses: Impaired  tissue integrity BURWELL, BETHEL ([id number]) Goals: Patient/caregiver will verbalize understanding of skin care regimen Date Initiated: 03/01/2016 Target Resolution Date: 07/05/2016 Goal Status: Active Ulcer/skin breakdown will have a volume reduction of 30% by week 4 Date Initiated: 03/01/2016 Target Resolution Date: 07/12/2016 Goal Status: Active Ulcer/skin breakdown will have a volume reduction of 50% by week 8 Date Initiated: 03/01/2016 Target Resolution Date: 07/12/2016 Goal Status: Active Ulcer/skin breakdown will have a volume reduction of 80% by week 12 Date Initiated: 03/01/2016 Target Resolution Date: 07/12/2016 Goal Status: Active Ulcer/skin breakdown will heal within 14 weeks Date Initiated: 03/01/2016 Target Resolution Date: 07/12/2016 Goal Status: Active Interventions: Assess patient/caregiver ability to obtain necessary supplies Assess patient/caregiver ability to perform ulcer/skin care regimen upon admission and as needed Assess ulceration(s) every visit Notes: Electronic Signature(s) Signed: 08/23/2016 4:10:27 PM By: Montey Hora Entered By: Montey Hora on 08/23/2016 10:43:51 [name] ([id number]) -------------------------------------------------------------------------------- Pain Assessment Details Patient Name: Bradley Loa A. Date of Service: 08/23/2016 10:15 AM Medical Record Number: [id number] Patient Account Number: [id number] Date of Birth/Sex: April 30, 1946 (71 y.o. Male) Treating RN: Montey Hora Primary Care Kitti Mcclish: [name] Other Clinician: Referring Cinderella Christoffersen: [name] Treating Chayse Gracey/Extender: Tito Dine in Treatment: 25 Active Problems Location of Pain Severity and Description of Pain Patient  Has Paino No Site Locations Pain Management and Medication Current Pain Management: Notes Topical or injectable lidocaine is offered to patient for acute pain when surgical debridement is performed. If needed,  Patient is instructed to use over the counter pain medication for the following 24-48 hours after debridement. Wound care MDs do not prescribed pain medications. Patient has chronic pain or uncontrolled pain. Patient has been instructed to make an appointment with their Primary Care Physician for pain management. Electronic Signature(s) Signed: 08/23/2016 4:10:27 PM By: Montey Hora Entered By: Montey Hora on 08/23/2016 10:34:45 [name] ([id number]) -------------------------------------------------------------------------------- Patient/Caregiver Education Details Patient Name: [name] Date of Service: 08/23/2016 10:15 AM Medical Record Number: [id number] Patient Account Number: [id number] Date of Birth/Gender: 20-Oct-1945 (71 y.o. Male) Treating RN: Montey Hora Primary Care Physician: [name] Other Clinician: Referring Physician: [name] Treating Physician/Extender: Tito Dine in Treatment: 25 Education Assessment Education Provided To: Patient Education Topics Provided Wound/Skin Impairment: Handouts: Other: wound care as ordered Methods: Demonstration, Explain/Verbal Responses: State content correctly Electronic Signature(s) Signed: 08/23/2016 4:10:27 PM By: Montey Hora Entered By: Montey Hora on 08/23/2016 10:55:44 [name] ([id number]) -------------------------------------------------------------------------------- Wound Assessment Details Patient Name: Bradley Loa A. Date of Service: 08/23/2016 10:15 AM Medical Record Number: [id number] Patient Account Number: [id number] Date of Birth/Sex: [birth year] (71 y.o. Male) Treating RN: Montey Hora Primary Care Bransen Fassnacht: [name] Other Clinician: Referring Kaisa Wofford: [name] Treating Mellony Danziger/Extender: Ricard Dillon Weeks in Treatment: 25 Wound Status Wound Number: 5 Primary Pressure Ulcer Etiology: Wound Location: Sacrum Wound  Status: Open Wounding Event: Pressure Injury Comorbid Hypertension, Rheumatoid Arthritis, Date Acquired: 11/05/2014 History: Paraplegia Weeks Of Treatment: 25 Clustered Wound: No Photos Photo Uploaded By: Montey Hora on 08/23/2016 13:00:15 Wound Measurements Length: (cm) 10.5 Width: (cm) 8.5 Depth: (cm) 0.4 Area: (cm) 70.097 Volume: (cm) 28.039 % Reduction in Area: -2% % Reduction in Volume: 83% Epithelialization: None Tunneling: No Undermining: No Wound Description Classification: Category/Stage III Wound Margin: Thickened Exudate Amount: Large Exudate Type: Serous Exudate Color: amber Foul Odor After Cleansing: No Slough/Fibrino Yes Wound Bed Granulation Amount: Large (67-100%) Exposed Structure Granulation Quality: Red Fascia Exposed: No Necrotic Amount: Small (1-33%) Fat Layer (Subcutaneous Tissue) Exposed: Yes Necrotic Quality: Adherent Slough Tendon Exposed: No ELIA, KEENUM A. ([id number]) Muscle Exposed: No Joint Exposed: No Bone Exposed: No Periwound Skin Texture Texture Color No Abnormalities Noted: No No Abnormalities Noted: No Callus: No Atrophie Blanche: No Crepitus: No Cyanosis: No Excoriation: No Ecchymosis: No Induration: No Erythema: No Rash: No Hemosiderin Staining: No Scarring: Yes Mottled: No Pallor: No Moisture Rubor: No No Abnormalities Noted: No Dry / Scaly: No Temperature / Pain Maceration: Yes Temperature: No Abnormality Wound Preparation Ulcer Cleansing: Rinsed/Irrigated with Saline Topical Anesthetic Applied: Other: idocaine 4%, Treatment Notes Wound #5 (Sacrum) 1. Cleansed with: Clean wound with Normal Saline 2. Anesthetic Topical Lidocaine 4% cream to wound bed prior to debridement 4. Dressing Applied: Other dressing (specify in notes) 5. Secondary Dressing Applied ABD Pad 7. 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 Date of Service: 08/23/2016 10:15 AM Medical Record Number: [id number] Patient Account Number: [id number] Date of Birth/Sex: 1945-08-09 (71 y.o. Male) Treating RN: Montey Hora Primary Care Chelise Hanger: [name] Other Clinician: Referring Heberto Sturdevant: [name] Treating Antron Seth/Extender: Tito Dine in Treatment: 25 Vital Signs Time Taken: 10:35 Temperature (F): 97.6 Pulse (  bpm): 81 Respiratory Rate (breaths/min): 18 Blood Pressure (mmHg): 120/63 Reference Range: 80 - 120 mg / dl Electronic Signature(s) Signed: 08/23/2016 4:10:27 PM By: Montey Hora Entered By: Montey Hora on 08/23/2016 10:35:07

## 2016-08-24 NOTE — Progress Notes (Addendum)
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 although he seems to be running out of Aquacel Ag and on those days he uses Medihoney. [facility] has [facility] was supplying his dressings. He still complains of drainage. He does his dressings independently. He has in his motorized chair for 2-3 hours that time other than that he offloads this. Dimensions of the wound are down 1 cm in both directions. He underwent an aggressive debridement on his last visit of thick circumferential skin and subcutaneous tissue. It is possible at some point in the future he is going to need this done again 05/24/16; the patient returns today for review of his stage III sacral pressure ulcer. We have been using Aquacel Ag he tells me that he changes this up to twice a day. I'm not really certain of the reason  for this frequency of changing. He has some involvement from the home health nurses but I think is doing most of the changing himself which I think because of his paraplegia would be a very difficult exercise. Nevertheless he states that there is "wetness". I am not sure if there is another dressing that we could easily changed that much. I'd wanted to change to [facility] but I'll need to have a sense of how frequent he would need to change this. 06/14/16; this is a patient returns for review of his stage III sacral pressure ulcer. We have been using Aquacel Ag and over the last 2 visits he has had extensive debridement so of the thick circumferential skin and subcutaneous tissue that surrounds this wound. In spite of this really absolutely no change  in the condition of the wound warrants measurements. We have [facility] home health I believe changing the dressing on 3 occasions the patient states he does this on one occasion himself 06/28/16; this is a patient who has a fairly large stage III sacral pressure ulcer. I changed him to [facility] from Aquacel 2 weeks ago. He returns today in follow-up. In the meantime a nurse from [facility] has calledrequesting ordering of a wound VAC. He had this discussion before. The problem is the proximity of the lowest edge of this wound to the patient's anal opening roughly 3/4 of an inch. Can't see Bradley [name], Bradley [name] ([id number]) how this can be arranged. Apparently the nurse who is calling has a lot of experience, the question would be then when she is not available would be doing this. I would not have thought that this wound is not amenable to a wound VAC because of this reason 07/12/16; the patient comes in today and I have signed orders for a wound VAC. The home health team through [facility] is convinced that he can benefit from this even though there is close proximity to his anal opening beneath the gluteal clefts. The patient does not have a  bowel regimen but states he has a bowel movement every 2 days this will also provide some problem with regards to the vac seal 07/26/16; the patient never did obtain a [place] wound VAC as he could not afford the $200 per month co- pay we have been using Hydrofera Blue now for 6 weeks or so. No major change in this wound at all. He is still not interested in the concept of plastic surgery. There changing the dressing every second day 08/09/16; the patient arrives with a wound precisely in the same situation. In keeping with the plan I outlined last time extensive debridement with an open curet the surface of this is not completely viable. Still has some degree of surrounding thick skin and subcutaneous tissue. No evidence of infection. Once again I have had a conversation with him about plastic surgery, he is simply not interested. 08/23/16; wound is really no different. Thick circumferential skin and subcutaneous tissue around the wound edge which is a lot better from debridement we did earlier in the year. The surface of the wound looks viable however with a curet there is definitely a gritty surface to this. We use Medihoney for a while, he could not afford Santyl. I don't think we could get a supply of Iodoflex. He talks a little more positively about the concept of plastic surgery which I've gone over with him today Objective Constitutional Sitting or standing Blood Pressure is within target range for patient.. Pulse regular and within target range for patient.Marland Kitchen Respirations regular, non-labored and within target range.. Temperature is normal and within the target range for the patient.. Patient's appearance is neat and clean. Appears in no acute distress. Well nourished and well developed.. Vitals Time Taken: 10:35 AM, Temperature: 97.6 F, Pulse: 81 bpm, Respiratory Rate: 18 breaths/min, Blood Pressure: 120/63 mmHg. General Notes: Wound exam; once again no major change in any part of this  wounds visible appearance. Using a scalpel I removed probably 3 inches of this 6 white circumflex, friends around this wound which is a combination of skin and subcutaneous tissue. I also then aggressively debrided the wound base in close proximity to the circumference that was debrided. I used a #15 blade and pickups for the wound circumference and a #3 curet to the wound  bed itself. This is a bit of an experiment to see if I can promote healing in any part of this large wound. Integumentary (Hair, Skin) Wound #5 status is Open. Original cause of wound was Pressure Injury. The wound is located on the Sacrum. The wound measures 10.5cm length x 8.5cm width x 0.4cm depth; 70.097cm^2 area and 28.039cm^3 volume. There is Fat Layer (Subcutaneous Tissue) Exposed exposed. There is no tunneling or undermining noted. There is a large amount of serous drainage noted. The wound margin is thickened. Bradley [name], Bradley A. ([id number]) There is large (67-100%) red granulation within the wound bed. There is a small (1-33%) amount of necrotic tissue within the wound bed including Adherent Slough. The periwound skin appearance exhibited: Scarring, Maceration. The periwound skin appearance did not exhibit: Callus, Crepitus, Excoriation, Induration, Rash, Dry/Scaly, Atrophie Blanche, Cyanosis, Ecchymosis, Hemosiderin Staining, Mottled, Pallor, Rubor, Erythema. Periwound temperature was noted as No Abnormality. Assessment Active Problems ICD-10 L89.153 - Pressure ulcer of sacral region, stage 3 G82.21 - Paraplegia, complete Procedures Wound #5 Wound #5 is a Pressure Ulcer located on the Sacrum . There was a Skin/Subcutaneous Tissue Debridement ([id number]) debridement with total area of 25 sq cm performed by Ricard Dillon, MD. with the following instrument(s): Blade, Curette, and Forceps to remove Viable and Non-Viable tissue/material including Fibrin/Slough and Skin after achieving pain control using  Lidocaine 4% Topical Solution. A time out was conducted at 10:57, prior to the start of the procedure. A Moderate amount of bleeding was controlled with Silver Nitrate. The procedure was tolerated well with a pain level of 0 throughout and a pain level of 0 following the procedure. Post Debridement Measurements: 10.5cm length x 8.5cm width x 0.4cm depth; 28.039cm^3 volume. Post debridement Stage noted as Category/Stage III. Character of Wound/Ulcer Post Debridement is improved. Severity of Tissue Post Debridement is: Fat layer exposed. Post procedure Diagnosis Wound #5: Same as Pre-Procedure Plan Wound Cleansing: Wound #5 Sacrum: Clean wound with Normal Saline. May Shower, gently pat wound dry prior to applying new dressing. Anesthetic: Bradley [name], Bradley [name] ([id number]) Wound #5 Sacrum: Topical Lidocaine 4% cream applied to wound bed prior to debridement Skin Barriers/Peri-Wound Care: Wound #5 Sacrum: Skin Prep Primary Wound Dressing: Wound #5 Sacrum: Cutimed Sorbact Secondary Dressing: Wound #5 Sacrum: ABD pad Dry Gauze XtraSorb - HHRN to please provide this for patient Dressing Change Frequency: Wound #5 Sacrum: Change dressing every day. - HHRN to visit 3 times weekly Follow-up Appointments: Wound #5 Sacrum: Return Appointment in 2 weeks. Off-Loading: Wound #5 Sacrum: Roho cushion for wheelchair Mattress - continue air mattress Turn and reposition every 2 hours Additional Orders / Instructions: Wound #5 Sacrum: Increase protein intake. Home Health: Wound #5 Sacrum: [place] Nurse may visit PRN to address patient s wound care needs. FACE TO FACE ENCOUNTER: MEDICARE and MEDICAID PATIENTS: I certify that this patient is under my care and that I had a face-to-face encounter that meets the physician face-to-face encounter requirements with this patient on this date. The encounter with the patient was in whole or in part for the following  MEDICAL CONDITION: (primary reason for [place]) MEDICAL NECESSITY: I certify, that based on my findings, NURSING services are a medically necessary home health service. HOME BOUND STATUS: I certify that my clinical findings support that this patient is homebound (i.e., Due to illness or injury, pt requires aid of supportive devices such as crutches, cane, wheelchairs, walkers, the use of special transportation or the assistance of another person  to leave their place of residence. There is a normal inability to leave the home and doing so requires considerable and taxing effort. Other absences are for medical reasons / religious services and are infrequent or of short duration when for other reasons). If current dressing causes regression in wound condition, may D/C ordered dressing product/s and apply Normal Saline Moist Dressing daily until next [place] / Other MD appointment. [place] of regression in wound condition at [phone]. Please direct any NON-WOUND related issues/requests for orders to patient's Primary Care Physician Bradley [name], Bradley [name] ([id number]) #1 I continued with sorbact, ABD #2 and I see him back next week which is awake earlier than I usually do. I want to see if I can tease some epithelialization out of one part of this wound Electronic Signature(s) Signed: 08/23/2016 4:28:03 PM By: Linton Ham MD Entered By: Linton Ham on 08/23/2016 12:25:40 [name] ([id number]) -------------------------------------------------------------------------------- [place] Details Patient Name: Bradley Loa A. Date of Service: 08/23/2016 Medical Record Number: [id number] Patient Account Number: [id number] Date of Birth/Sex: 1946-05-02 (71 y.o. Male) Treating RN: Montey Hora Primary Care Provider: [name] Other Clinician: Referring Provider: [name] Treating Provider/Extender: Ricard Dillon Weeks in  Treatment: 25 Diagnosis Coding ICD-10 Codes Code Description L89.153 Pressure ulcer of sacral region, stage 3 G82.21 Paraplegia, complete Facility Procedures CPT4 Code: [id number] Description: 11042 - DEB SUBQ TISSUE 20 SQ CM/< ICD-10 Description Diagnosis L89.153 Pressure ulcer of sacral region, stage 3 Modifier: Quantity: 1 CPT4 Code: [id number] Description: 11045 - DEB SUBQ TISS EA ADDL 20CM ICD-10 Description Diagnosis L89.153 Pressure ulcer of sacral region, stage 3 Modifier: Quantity: 1 Physician Procedures CPT4 Code: [id number] Description: 11042 - WC PHYS SUBQ TISS 20 SQ CM ICD-10 Description Diagnosis L89.153 Pressure ulcer of sacral region, stage 3 Modifier: Quantity: 1 CPT4 Code: [id number] Description: 87681 - WC PHYS SUBQ TISS EA ADDL 20 CM ICD-10 Description Diagnosis L89.153 Pressure ulcer of sacral region, stage 3 Modifier: Quantity: 1 Electronic Signature(s) Signed: 08/23/2016 4:28:03 PM By: Linton Ham MD Entered By: Linton Ham on 08/23/2016 12:25:58

## 2016-08-26 DIAGNOSIS — Z87828 Personal history of other (healed) physical injury and trauma: Secondary | ICD-10-CM | POA: Diagnosis not present

## 2016-08-26 DIAGNOSIS — Z4800 Encounter for change or removal of nonsurgical wound dressing: Secondary | ICD-10-CM | POA: Diagnosis not present

## 2016-08-26 DIAGNOSIS — I10 Essential (primary) hypertension: Secondary | ICD-10-CM | POA: Diagnosis not present

## 2016-08-26 DIAGNOSIS — L8943 Pressure ulcer of contiguous site of back, buttock and hip, stage 3: Secondary | ICD-10-CM | POA: Diagnosis not present

## 2016-08-26 DIAGNOSIS — M069 Rheumatoid arthritis, unspecified: Secondary | ICD-10-CM | POA: Diagnosis not present

## 2016-08-26 DIAGNOSIS — G8221 Paraplegia, complete: Secondary | ICD-10-CM | POA: Diagnosis not present

## 2016-08-30 ENCOUNTER — Ambulatory Visit: Payer: Commercial Managed Care - HMO | Admitting: Internal Medicine

## 2016-08-31 ENCOUNTER — Encounter: Payer: Medicare HMO | Admitting: Internal Medicine

## 2016-08-31 DIAGNOSIS — G8221 Paraplegia, complete: Secondary | ICD-10-CM | POA: Diagnosis not present

## 2016-08-31 DIAGNOSIS — M069 Rheumatoid arthritis, unspecified: Secondary | ICD-10-CM | POA: Diagnosis not present

## 2016-08-31 DIAGNOSIS — I10 Essential (primary) hypertension: Secondary | ICD-10-CM | POA: Diagnosis not present

## 2016-08-31 DIAGNOSIS — L89153 Pressure ulcer of sacral region, stage 3: Secondary | ICD-10-CM | POA: Diagnosis not present

## 2016-09-02 DIAGNOSIS — M069 Rheumatoid arthritis, unspecified: Secondary | ICD-10-CM | POA: Diagnosis not present

## 2016-09-02 DIAGNOSIS — Z4800 Encounter for change or removal of nonsurgical wound dressing: Secondary | ICD-10-CM | POA: Diagnosis not present

## 2016-09-02 DIAGNOSIS — Z87828 Personal history of other (healed) physical injury and trauma: Secondary | ICD-10-CM | POA: Diagnosis not present

## 2016-09-02 DIAGNOSIS — G8221 Paraplegia, complete: Secondary | ICD-10-CM | POA: Diagnosis not present

## 2016-09-02 DIAGNOSIS — L8943 Pressure ulcer of contiguous site of back, buttock and hip, stage 3: Secondary | ICD-10-CM | POA: Diagnosis not present

## 2016-09-02 DIAGNOSIS — I10 Essential (primary) hypertension: Secondary | ICD-10-CM | POA: Diagnosis not present

## 2016-09-03 NOTE — Progress Notes (Signed)
Bradley [name], Bradley [name] ([id number]) Visit Report for 08/31/2016 Arrival Information Details Patient Name: Bradley [name], Bradley [name]. Date of Service: 08/31/2016 3:30 PM Medical Record Number: [id number] Patient Account Number: [id number] Date of Birth/Sex: 1945/10/29 (71 y.o. Male) Treating RN: Cornell Barman Primary Care Imani Fiebelkorn: [name] Other Clinician: Referring Kolten Ryback: [name] Treating Donavin Audino/Extender: Tito Dine in Treatment: 26 Visit Information History Since Last Visit Added or deleted any medications: No Patient Arrived: Wheel Chair Any new allergies or adverse reactions: No Arrival Time: 15:16 Had a fall or experienced change in No activities of daily living that may affect Accompanied By: self risk of falls: Transfer Assistance: None Signs or symptoms of abuse/neglect since last No Patient Identification Verified: Yes visito Secondary Verification Process Yes Hospitalized since last visit: No Completed: Has Dressing in Place as Prescribed: Yes Patient Requires Transmission-Based No Pain Present Now: No Precautions: Patient Has Alerts: No Electronic Signature(s) Signed: 09/01/2016 5:16:14 PM By: Gretta Cool, RN, BSN, Kim RN, BSN Entered By: Gretta Cool, RN, BSN, Kim on 08/31/2016 15:16:41 Bradley [name] ([id number]) -------------------------------------------------------------------------------- Clinic Level of Care Assessment Details Patient Name: Bradley [name], Bradley A. Date of Service: 08/31/2016 3:30 PM Medical Record Number: [id number] Patient Account Number: [id number] Date of Birth/Sex: 07/24/1945 (71 y.o. Male) Treating RN: Cornell Barman Primary Care Aliviyah Malanga: [name] Other Clinician: Referring Orian Amberg: [name] Treating Elea Holtzclaw/Extender: Tito Dine in Treatment: 26 Clinic Level of Care Assessment Items TOOL 4 Quantity Score []  - Use when only an EandM is performed on FOLLOW-UP visit 0 ASSESSMENTS - Nursing Assessment /  Reassessment []  - Reassessment of Co-morbidities (includes updates in patient status) 0 X - Reassessment of Adherence to Treatment Plan 1 5 ASSESSMENTS - Wound and Skin Assessment / Reassessment X - Simple Wound Assessment / Reassessment - one wound 1 5 []  - Complex Wound Assessment / Reassessment - multiple wounds 0 []  - Dermatologic / Skin Assessment (not related to wound area) 0 ASSESSMENTS - Focused Assessment []  - Circumferential Edema Measurements - multi extremities 0 []  - Nutritional Assessment / Counseling / Intervention 0 []  - Lower Extremity Assessment (monofilament, tuning fork, pulses) 0 []  - Peripheral Arterial Disease Assessment (using hand held doppler) 0 ASSESSMENTS - Ostomy and/or Continence Assessment and Care []  - Incontinence Assessment and Management 0 []  - Ostomy Care Assessment and Management (repouching, etc.) 0 PROCESS - Coordination of Care X - Simple Patient / Family Education for ongoing care 1 15 []  - Complex (extensive) Patient / Family Education for ongoing care 0 X - Staff obtains Programmer, systems, Records, Test Results / Process Orders 1 10 []  - Staff telephones HHA, Nursing Homes / Clarify orders / etc 0 []  - Routine Transfer to another Facility (non-emergent condition) 0 Bradley [name], Bradley [name] ([id number]) []  - Routine Hospital Admission (non-emergent condition) 0 []  - New Admissions / Biomedical engineer / Ordering NPWT, Apligraf, etc. 0 []  - Emergency Hospital Admission (emergent condition) 0 X - Simple Discharge Coordination 1 10 []  - Complex (extensive) Discharge Coordination 0 PROCESS - Special Needs []  - Pediatric / Minor Patient Management 0 []  - Isolation Patient Management 0 []  - Hearing / Language / Visual special needs 0 []  - Assessment of Community assistance (transportation, D/C planning, etc.) 0 []  - Additional assistance / Altered mentation 0 []  - Support Surface(s) Assessment (bed, cushion, seat, etc.) 0 INTERVENTIONS - Wound Cleansing /  Measurement X - Simple Wound Cleansing - one wound 1 5 []  - Complex Wound Cleansing - multiple wounds 0 X - Wound Imaging (photographs -  any number of wounds) 1 5 []  - Wound Tracing (instead of photographs) 0 X - Simple Wound Measurement - one wound 1 5 []  - Complex Wound Measurement - multiple wounds 0 INTERVENTIONS - Wound Dressings []  - Small Wound Dressing one or multiple wounds 0 X - Medium Wound Dressing one or multiple wounds 1 15 []  - Large Wound Dressing one or multiple wounds 0 []  - Application of Medications - topical 0 []  - Application of Medications - injection 0 INTERVENTIONS - Miscellaneous []  - External ear exam 0 Bradley [name], Bradley A. ([id number]) []  - Specimen Collection (cultures, biopsies, blood, body fluids, etc.) 0 []  - Specimen(s) / Culture(s) sent or taken to Lab for analysis 0 X - Patient Transfer (multiple staff / Civil Service fast streamer / Similar devices) 1 10 []  - Simple Staple / Suture removal (25 or less) 0 []  - Complex Staple / Suture removal (26 or more) 0 []  - Hypo / Hyperglycemic Management (close monitor of Blood Glucose) 0 []  - Ankle / Brachial Index (ABI) - do not check if billed separately 0 X - Vital Signs 1 5 Has the patient been seen at the hospital within the last three years: Yes Total Score: 90 Level Of Care: New/Established - Level 3 Electronic Signature(s) Signed: 09/01/2016 5:16:14 PM By: Gretta Cool, RN, BSN, Kim RN, BSN Entered By: Gretta Cool, RN, BSN, Kim on 08/31/2016 16:13:40 Bradley [name] ([id number]) -------------------------------------------------------------------------------- Encounter Discharge Information Details Patient Name: Bradley Loa A. Date of Service: 08/31/2016 3:30 PM Medical Record Number: [id number] Patient Account Number: [id number] Date of Birth/Sex: Aug 02, 1945 (71 y.o. Male) Treating RN: Cornell Barman Primary Care Deon Ivey: [name] Other Clinician: Referring Amada Hallisey: [name] Treating Raedyn Wenke/Extender: Tito Dine in Treatment: 45 Encounter Discharge Information Items Discharge Pain Level: 0 Discharge Condition: Stable Ambulatory Status: Wheelchair Discharge Destination: Home Transportation: Private Auto Accompanied By: self Schedule Follow-up Appointment: Yes Medication Reconciliation completed and provided to Patient/Care Yes Kambrey Hagger: Provided on Clinical Summary of Care: 08/31/2016 Form Type Recipient Paper Patient JG Electronic Signature(s) Signed: 09/01/2016 5:16:14 PM By: Gretta Cool RN, BSN, Kim RN, BSN Previous Signature: 08/31/2016 4:08:26 PM Version By: Ruthine Dose Entered By: Gretta Cool RN, BSN, Kim on 08/31/2016 16:14:25 Bradley [name] ([id number]) -------------------------------------------------------------------------------- Lower Extremity Assessment Details Patient Name: TAGGART, PRASAD A. Date of Service: 08/31/2016 3:30 PM Medical Record Number: [id number] Patient Account Number: [id number] Date of Birth/Sex: [birth year] (71 y.o. Male) Treating RN: Cornell Barman Primary Care Tajah Schreiner: [name] Other Clinician: Referring Demarea Lorey: [name] Treating Asuncion Tapscott/Extender: Tito Dine in Treatment: 26 Electronic Signature(s) Signed: 09/01/2016 5:16:14 PM By: Gretta Cool, RN, BSN, Kim RN, BSN Entered By: Gretta Cool, RN, BSN, Kim on 08/31/2016 15:31:45 Bradley [name] ([id number]) -------------------------------------------------------------------------------- Multi Wound Chart Details Patient Name: DMARI, SCHUBRING A. Date of Service: 08/31/2016 3:30 PM Medical Record Number: [id number] Patient Account Number: [id number] Date of Birth/Sex: Jul 16, 1945 (71 y.o. Male) Treating RN: Cornell Barman Primary Care Jenella Craigie: [name] Other Clinician: Referring Ghazal Pevey: [name] Treating Tujuana Kilmartin/Extender: Tito Dine in Treatment: 26 Vital Signs Height(in): Pulse(bpm): 79 Weight(lbs): Blood Pressure 148/88 (mmHg): Body Mass  Index(BMI): Temperature(F): 98.2 Respiratory Rate 16 (breaths/min): Photos: [5:No Photos] [N/A:N/A] Wound Location: [5:Sacrum] [N/A:N/A] Wounding Event: [5:Pressure Injury] [N/A:N/A] Primary Etiology: [5:Pressure Ulcer] [N/A:N/A] Comorbid History: [5:Hypertension, Rheumatoid Arthritis, Paraplegia] [N/A:N/A] Date Acquired: [5:11/05/2014] [N/A:N/A] Weeks of Treatment: [5:26] [N/A:N/A] Wound Status: [5:Open] [N/A:N/A] Measurements L x W x D 10.6x8x0.5 [N/A:N/A] (cm) Area (cm) : [5:66.602] [N/A:N/A] Volume (cm) : [5:33.301] [N/A:N/A] % Reduction in Area: [5:3.10%] [N/A:N/A] %  Reduction in Volume: 79.80% [N/A:N/A] Classification: [5:Category/Stage III] [N/A:N/A] Exudate Amount: [5:Large] [N/A:N/A] Exudate Type: [5:Serous] [N/A:N/A] Exudate Color: [5:amber] [N/A:N/A] Wound Margin: [5:Thickened] [N/A:N/A] Granulation Amount: [5:Large (67-100%)] [N/A:N/A] Granulation Quality: [5:Red] [N/A:N/A] Necrotic Amount: [5:Small (1-33%)] [N/A:N/A] Exposed Structures: [5:Fat Layer (Subcutaneous Tissue) Exposed: Yes Fascia: No Tendon: No Muscle: No] [N/A:N/A] Joint: No Bone: No Epithelialization: None N/A N/A Periwound Skin Texture: Scarring: Yes N/A N/A Excoriation: No Induration: No Callus: No Crepitus: No Rash: No Periwound Skin Maceration: Yes N/A N/A Moisture: Dry/Scaly: No Periwound Skin Color: Atrophie Blanche: No N/A N/A Cyanosis: No Ecchymosis: No Erythema: No Hemosiderin Staining: No Mottled: No Pallor: No Rubor: No Temperature: No Abnormality N/A N/A Tenderness on No N/A N/A Palpation: Wound Preparation: Ulcer Cleansing: N/A N/A Rinsed/Irrigated with Saline Topical Anesthetic Applied: Other: idocaine 4% Treatment Notes Wound #5 (Sacrum) 1. Cleansed with: Clean wound with Normal Saline 2. Anesthetic Topical Lidocaine 4% cream to wound bed prior to debridement 3. Peri-wound Care: Skin Prep 4. Dressing Applied: Other dressing (specify in notes) 5.  Secondary Dressing Applied ABD Pad 7. Secured with Tape Notes siltec, xtrasorb Bradley [name], Bradley [name] ([id number]) Electronic Signature(s) Signed: 09/01/2016 5:50:35 AM By: Linton Ham MD Entered By: Linton Ham on 08/31/2016 17:45:44 Bradley [name] ([id number]) -------------------------------------------------------------------------------- [place] Details Patient Name: Bradley [name], Bradley [name] A. Date of Service: 08/31/2016 3:30 PM Medical Record Number: [id number] Patient Account Number: [id number] Date of Birth/Sex: [birth year] (71 y.o. Male) Treating RN: Cornell Barman Primary Care Brailen Macneal: [name] Other Clinician: Referring Damoni Erker: [name] Treating Shiesha Jahn/Extender: Tito Dine in Treatment: 26 Active Inactive ` Abuse / Safety / Falls / Self Care Management Nursing Diagnoses: Impaired physical mobility Potential for falls Goals: Patient will remain injury free Date Initiated: 03/01/2016 Target Resolution Date: 07/12/2016 Goal Status: Active Interventions: Assess fall risk on admission and as needed Notes: ` Pressure Nursing Diagnoses: Knowledge deficit related to causes and risk factors for pressure ulcer development Goals: Patient will remain free from development of additional pressure ulcers Date Initiated: 03/01/2016 Target Resolution Date: 07/19/2016 Goal Status: Active Interventions: Provide education on pressure ulcers Notes: ` Wound/Skin Impairment Nursing Diagnoses: Impaired tissue integrity Bradley [name], Bradley [name] ([id number]) Goals: Patient/caregiver will verbalize understanding of skin care regimen Date Initiated: 03/01/2016 Target Resolution Date: 07/05/2016 Goal Status: Active Ulcer/skin breakdown will have a volume reduction of 30% by week 4 Date Initiated: 03/01/2016 Target Resolution Date: 07/12/2016 Goal Status: Active Ulcer/skin breakdown will have a volume reduction of 50% by week 8 Date Initiated:  03/01/2016 Target Resolution Date: 07/12/2016 Goal Status: Active Ulcer/skin breakdown will have a volume reduction of 80% by week 12 Date Initiated: 03/01/2016 Target Resolution Date: 07/12/2016 Goal Status: Active Ulcer/skin breakdown will heal within 14 weeks Date Initiated: 03/01/2016 Target Resolution Date: 07/12/2016 Goal Status: Active Interventions: Assess patient/caregiver ability to obtain necessary supplies Assess patient/caregiver ability to perform ulcer/skin care regimen upon admission and as needed Assess ulceration(s) every visit Notes: Electronic Signature(s) Signed: 09/01/2016 5:16:14 PM By: Gretta Cool, RN, BSN, Kim RN, BSN Entered By: Gretta Cool, RN, BSN, Kim on 08/31/2016 16:04:07 Bradley [name] ([id number]) -------------------------------------------------------------------------------- Pain Assessment Details Patient Name: Bradley Loa A. Date of Service: 08/31/2016 3:30 PM Medical Record Number: [id number] Patient Account Number: [id number] Date of Birth/Sex: Oct 10, 1945 (71 y.o. Male) Treating RN: Cornell Barman Primary Care Aleksandr Pellow: [name] Other Clinician: Referring Shelle Galdamez: [name] Treating Wayland Baik/Extender: Tito Dine in Treatment: 26 Active Problems Location of Pain Severity and Description of Pain Patient Has Paino Yes Site Locations Pain Location: Pain in  Ulcers With Dressing Change: Yes Rate the pain. Current Pain Level: 4 Pain Management and Medication Current Pain Management: Notes Topical or injectable lidocaine is offered to patient for acute pain when surgical debridement is performed. If needed, Patient is instructed to use over the counter pain medication for the following 24-48 hours after debridement. Wound care MDs do not prescribed pain medications. Patient has chronic pain or uncontrolled pain. Patient has been instructed to make an appointment with their Primary Care Physician for pain management. Electronic  Signature(s) Signed: 09/01/2016 5:16:14 PM By: Gretta Cool, RN, BSN, Kim RN, BSN Entered By: Gretta Cool, RN, BSN, Kim on 08/31/2016 15:17:36 Bradley [name] ([id number]) -------------------------------------------------------------------------------- Patient/Caregiver Education Details Patient Name: Bradley [name], Bradley A. Date of Service: 08/31/2016 3:30 PM Medical Record Number: [id number] Patient Account Number: [id number] Date of Birth/Gender: [birth year] (71 y.o. Male) Treating RN: Cornell Barman Primary Care Physician: [name] Other Clinician: Referring Physician: [name] Treating Physician/Extender: Tito Dine in Treatment: 104 Education Assessment Education Provided To: Patient Education Topics Provided Pressure: Handouts: Pressure Ulcers: Care and Offloading Methods: Demonstration, Explain/Verbal Responses: State content correctly Wound/Skin Impairment: Electronic Signature(s) Signed: 09/01/2016 5:16:14 PM By: Gretta Cool, RN, BSN, Kim RN, BSN Entered By: Gretta Cool, RN, BSN, Kim on 08/31/2016 16:14:42 Bradley [name] ([id number]) -------------------------------------------------------------------------------- Wound Assessment Details Patient Name: Bradley [name], Bradley A. Date of Service: 08/31/2016 3:30 PM Medical Record Number: [id number] Patient Account Number: [id number] Date of Birth/Sex: 1945-07-25 (71 y.o. Male) Treating RN: Cornell Barman Primary Care Rosilyn Coachman: [name] Other Clinician: Referring Minela Bridge[place]: [name] Treating Earlene Bjelland/Extender: Ricard Dillon Weeks in Treatment: 26 Wound Status Wound Number: 5 Primary Pressure Ulcer Etiology: Wound Location: Sacrum Wound Status: Open Wounding Event: Pressure Injury Comorbid Hypertension, Rheumatoid Arthritis, Date Acquired: 11/05/2014 History: Paraplegia Weeks Of Treatment: 26 Clustered Wound: No Wound Measurements Length: (cm) 10.6 Width: (cm) 8 Depth: (cm) 0.5 Area: (cm) 66.602 Volume:  (cm) 33.301 % Reduction in Area: 3.1% % Reduction in Volume: 79.8% Epithelialization: None Tunneling: No Undermining: No Wound Description Classification: Category/Stage III Wound Margin: Thickened Exudate Amount: Large Exudate Type: Serous Exudate Color: amber Foul Odor After Cleansing: No Slough/Fibrino Yes Wound Bed Granulation Amount: Large (67-100%) Exposed Structure Granulation Quality: Red Fascia Exposed: No Necrotic Amount: Small (1-33%) Fat Layer (Subcutaneous Tissue) Exposed: Yes Necrotic Quality: Adherent Slough Tendon Exposed: No Muscle Exposed: No Joint Exposed: No Bone Exposed: No Periwound Skin Texture Texture Color No Abnormalities Noted: No No Abnormalities Noted: No Callus: No Atrophie Blanche: No Crepitus: No Cyanosis: No Excoriation: No Ecchymosis: No Induration: No Erythema: No Rash: No Hemosiderin Staining: No Bradley [name], Bradley A. ([id number]) Scarring: Yes Mottled: No Pallor: No Moisture Rubor: No No Abnormalities Noted: No Dry / Scaly: No Temperature / Pain Maceration: Yes Temperature: No Abnormality Wound Preparation Ulcer Cleansing: Rinsed/Irrigated with Saline Topical Anesthetic Applied: Other: idocaine 4%, Treatment Notes Wound #5 (Sacrum) 1. Cleansed with: Clean wound with Normal Saline 2. Anesthetic Topical Lidocaine 4% cream to wound bed prior to debridement 3. Peri-wound Care: Skin Prep 4. Dressing Applied: Other dressing (specify in notes) 5. Secondary Dressing Applied ABD Pad 7. Secured with Tape Notes siltec, Manufacturing systems engineer) Signed: 09/01/2016 5:16:14 PM By: Gretta Cool, RN, BSN, Kim RN, BSN Entered By: Gretta Cool, RN, BSN, Kim on 08/31/2016 15:32:56 Bradley [name] ([id number]) -------------------------------------------------------------------------------- [place] Details Patient Name: Bradley [name] Date of Service: 08/31/2016 3:30 PM Medical Record Number: [id number] Patient Account Number:  [id number] Date of Birth/Sex: [birth year] (70 y.o. Male) Treating RN: Cornell Barman Primary Care Reia Viernes: [name] Other Clinician:  Referring Bruchy Mikel: [name] Treating Navon Kotowski/Extender: Tito Dine in Treatment: 26 Vital Signs Time Taken: 15:17 Temperature (F): 98.2 Pulse (bpm): 79 Respiratory Rate (breaths/min): 16 Blood Pressure (mmHg): 148/88 Reference Range: 80 - 120 mg / dl Electronic Signature(s) Signed: 09/01/2016 5:16:14 PM By: Gretta Cool, RN, BSN, Kim RN, BSN Entered By: Gretta Cool, RN, BSN, Kim on 08/31/2016 15:24:09

## 2016-09-06 DIAGNOSIS — L8943 Pressure ulcer of contiguous site of back, buttock and hip, stage 3: Secondary | ICD-10-CM | POA: Diagnosis not present

## 2016-09-06 DIAGNOSIS — Z4800 Encounter for change or removal of nonsurgical wound dressing: Secondary | ICD-10-CM | POA: Diagnosis not present

## 2016-09-06 DIAGNOSIS — M069 Rheumatoid arthritis, unspecified: Secondary | ICD-10-CM | POA: Diagnosis not present

## 2016-09-06 DIAGNOSIS — I10 Essential (primary) hypertension: Secondary | ICD-10-CM | POA: Diagnosis not present

## 2016-09-06 DIAGNOSIS — Z87828 Personal history of other (healed) physical injury and trauma: Secondary | ICD-10-CM | POA: Diagnosis not present

## 2016-09-06 DIAGNOSIS — G8221 Paraplegia, complete: Secondary | ICD-10-CM | POA: Diagnosis not present

## 2016-09-07 DIAGNOSIS — R8290 Unspecified abnormal findings in urine: Secondary | ICD-10-CM | POA: Diagnosis not present

## 2016-09-07 DIAGNOSIS — M62461 Contracture of muscle, right lower leg: Secondary | ICD-10-CM | POA: Diagnosis not present

## 2016-09-07 DIAGNOSIS — M62462 Contracture of muscle, left lower leg: Secondary | ICD-10-CM | POA: Diagnosis not present

## 2016-09-13 DIAGNOSIS — Z87828 Personal history of other (healed) physical injury and trauma: Secondary | ICD-10-CM | POA: Diagnosis not present

## 2016-09-13 DIAGNOSIS — L8943 Pressure ulcer of contiguous site of back, buttock and hip, stage 3: Secondary | ICD-10-CM | POA: Diagnosis not present

## 2016-09-13 DIAGNOSIS — Z4800 Encounter for change or removal of nonsurgical wound dressing: Secondary | ICD-10-CM | POA: Diagnosis not present

## 2016-09-13 DIAGNOSIS — G8221 Paraplegia, complete: Secondary | ICD-10-CM | POA: Diagnosis not present

## 2016-09-13 DIAGNOSIS — I10 Essential (primary) hypertension: Secondary | ICD-10-CM | POA: Diagnosis not present

## 2016-09-13 DIAGNOSIS — M069 Rheumatoid arthritis, unspecified: Secondary | ICD-10-CM | POA: Diagnosis not present

## 2016-09-14 NOTE — Progress Notes (Signed)
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 twice a day. I'm not really certain of the reason for this frequency of changing. He has some involvement from the home health nurses but I think is doing most of the changing himself which I think because of his paraplegia would be a very difficult exercise. Nevertheless he states that there is "wetness". I am not sure if there is another dressing that we could easily changed that much. I'd wanted to change to [facility] but I'll need to have a sense of how frequent he would need to change this. 06/14/16; this is a patient returns for review of his stage III sacral pressure ulcer. We have been using Aquacel Ag and over the last 2 visits he has had extensive debridement so of the thick circumferential skin and subcutaneous tissue that surrounds this wound. In spite of this really absolutely no change in the condition of  the wound warrants measurements. We have [facility] home [facility] I believe changing the dressing on Bradley [name], Bradley [name]. ([id number]) 3 occasions the patient states he does this on one occasion himself 06/28/16; this is a patient who has a fairly large stage III sacral pressure ulcer. I changed him to [facility] from Aquacel 2 weeks ago. He returns today in follow-up. In the meantime a nurse from [facility] has calledrequesting ordering of a wound VAC. He had this discussion before. The problem is the proximity of the lowest edge of this wound to the patient's anal opening roughly 3/4 of an inch. Can't  see how this can be arranged. Apparently the nurse who is calling has a lot of experience, the question would be then when she is not available would be doing this. I would not have thought that this wound is not amenable to a wound VAC because of this reason 07/12/16; the patient comes in today and I have signed orders for a wound VAC. The [facility] team through [facility] is convinced that he can benefit from this even though there is close proximity to his anal opening beneath the gluteal clefts. The patient does not have a bowel regimen but states he has a bowel movement every 2 days this will also provide some problem with regards to the vac seal 07/26/16; the patient never did obtain a [place] wound VAC as he could not afford the $200 per month co- pay we have been using Hydrofera Blue now for 6 weeks or so. No major change in this wound at all. He is still not interested in the concept of plastic surgery. There changing the dressing every second day 08/09/16; the patient arrives with a wound precisely in the same situation. In keeping with the plan I outlined last time extensive debridement with an open curet the surface of this is not completely viable. Still has some degree of surrounding thick skin and subcutaneous tissue. No evidence of infection. Once again I have had a conversation with  him about plastic surgery, he is simply not interested. 08/23/16; wound is really no different. Thick circumferential skin and subcutaneous tissue around the wound edge which is a lot better from debridement we did earlier in the year. The surface of the wound looks viable however with a curet there is definitely a gritty surface to this. We use Medihoney for a while, he could not afford Santyl. I don't think we could get a supply of Iodoflex. He talks a little more positively about the concept of plastic surgery which I've gone over with him today 08/31/16;; patient arrives in clinic today with the wound surface really no different there is no changes in dimensions. I debrided today surface on the left upper side of this wound aggressively week ago there is no real change here no evidence of epithelialization. The problem with debridement in the clinic is that he believes from this very liberally. We have been using Sorbact Objective Constitutional Patient is hypertensive.. Pulse regular and within target range for patient.Marland Kitchen Respirations regular, non-labored and within target range.. Temperature is normal and within the target range for the patient.. Patient's appearance is neat and clean. Appears in no acute distress. Well nourished and well developed.. Vitals Time Taken: 3:17 PM, Temperature: 98.2 F, Pulse: 79 bpm, Respiratory Rate: 16 breaths/min, Blood Pressure: 148/88 mmHg. General Notes: Wound exam; no major change in this wound including the part that I aggressively debrided last week. Although the surface of this looks viable when you use a curet on this there is definitely and nonviable surface. Circumference of this is surrounded by thick nonviable subcutaneous tissue which is difficult to debride her there is no evidence of surrounding infection Bradley [name], Bradley A. ([id number]) Integumentary (Hair, Skin) Wound #5 status is Open. Original cause of wound was Pressure Injury. The wound is  located on the Sacrum. The wound measures 10.6cm length x 8cm width x 0.5cm depth; 66.602cm^2 area and 33.301cm^3 volume. There is Fat Layer (Subcutaneous Tissue) Exposed exposed. There is no tunneling or undermining noted. There is a large amount of serous drainage noted. The wound margin  is thickened. There is large (67-100%) red granulation within the wound bed. There is a small (1-33%) amount of necrotic tissue within the wound bed including Adherent Slough. The periwound skin appearance exhibited: Scarring, Maceration. The periwound skin appearance did not exhibit: Callus, Crepitus, Excoriation, Induration, Rash, Dry/Scaly, Atrophie Blanche, Cyanosis, Ecchymosis, Hemosiderin Staining, Mottled, Pallor, Rubor, Erythema. Periwound temperature was noted as No Abnormality. Assessment Active Problems ICD-10 L89.153 - Pressure ulcer of sacral region, stage 3 G82.21 - Paraplegia, complete Plan Wound Cleansing: Wound #5 Sacrum: Clean wound with Normal Saline. May Shower, gently pat wound dry prior to applying new dressing. Anesthetic: Wound #5 Sacrum: Topical Lidocaine 4% cream applied to wound bed prior to debridement Skin Barriers/Peri-Wound Care: Wound #5 Sacrum: Skin Prep Primary Wound Dressing: Wound #5 Sacrum: Cutimed Sorbact Secondary Dressing: Wound #5 Sacrum: ABD pad Dry Gauze XtraSorb - HHRN to please provide this for patient Dressing Change Frequency: Wound #5 SacrumWELCOME, Bradley A. ([id number]) Change dressing every day. - HHRN to visit 3 times weekly Follow-up Appointments: Wound #5 Sacrum: Return Appointment in 2 weeks. Off-Loading: Wound #5 Sacrum: Roho cushion for wheelchair Mattress - continue air mattress Turn and reposition every 2 hours Additional Orders / Instructions: Wound #5 Sacrum: Increase protein intake. Home Health: Wound #5 Sacrum: [place] Nurse may visit PRN to address patient s wound care needs. FACE TO  FACE ENCOUNTER: MEDICARE and MEDICAID PATIENTS: I certify that this patient is under my care and that I had a face-to-face encounter that meets the physician face-to-face encounter requirements with this patient on this date. The encounter with the patient was in whole or in part for the following MEDICAL CONDITION: (primary reason for [place]) MEDICAL NECESSITY: I certify, that based on my findings, NURSING services are a medically necessary home health service. HOME BOUND STATUS: I certify that my clinical findings support that this patient is homebound (i.e., Due to illness or injury, pt requires aid of supportive devices such as crutches, cane, wheelchairs, walkers, the use of special transportation or the assistance of another person to leave their place of residence. There is a normal inability to leave the home and doing so requires considerable and taxing effort. Other absences are for medical reasons / religious services and are infrequent or of short duration when for other reasons). If current dressing causes regression in wound condition, may D/C ordered dressing product/s and apply Normal Saline Moist Dressing daily until next [place] / Other MD appointment. [place] of regression in wound condition at [phone]. Please direct any NON-WOUND related issues/requests for orders to patient's Primary Care Physician #1 I am continuing the Sorbact and hydrogel, xtrasorb, ABVD pad. This is being changed at home by home health #2 I have again talked to him about the concept of plastic surgery and all of its accompanying issues i.e. likely need for a colostomy, likely need for long-term care at least temporarily after any form of graft closure. I would be please to refer him back to [facility] if he elects to go in this direction. Electronic Signature(s) Signed: 09/01/2016 5:50:35 AM By: Linton Ham MD Entered By: Linton Ham on 08/31/2016  17:51:00 Bradley [name] ([id number]) -------------------------------------------------------------------------------- SuperBill Details Patient Name: Bradley [name] Date of Service: 08/31/2016 Medical Record Number: [id number] Patient Account Number: [id number] Date of Birth/Sex: 1945/06/29 (71 y.o. Male) Treating RN: Cornell Barman Primary Care Provider: [name] Other Clinician: Referring Provider: [name] Treating Provider/Extender: Ricard Dillon Weeks in  Treatment: 26 Diagnosis Coding ICD-10 Codes Code Description L89.153 Pressure ulcer of sacral region, stage 3 G82.21 Paraplegia, complete Facility Procedures CPT4 Code: [id number] Description: 16579 - WOUND CARE VISIT-LEV 3 EST PT Modifier: Quantity: 1 Physician Procedures CPT4 Code: [id number] Description: [id number] - WC PHYS LEVEL 2 - EST PT ICD-10 Description Diagnosis L89.153 Pressure ulcer of sacral region, stage 3 Modifier: Quantity: 1 Electronic Signature(s) Signed: 09/01/2016 5:50:35 AM By: Linton Ham MD Entered By: Linton Ham on 08/31/2016 17:56:15

## 2016-09-21 ENCOUNTER — Encounter: Payer: Medicare HMO | Attending: Internal Medicine | Admitting: Internal Medicine

## 2016-09-21 DIAGNOSIS — I10 Essential (primary) hypertension: Secondary | ICD-10-CM | POA: Diagnosis not present

## 2016-09-21 DIAGNOSIS — L89153 Pressure ulcer of sacral region, stage 3: Secondary | ICD-10-CM | POA: Diagnosis not present

## 2016-09-21 DIAGNOSIS — G8221 Paraplegia, complete: Secondary | ICD-10-CM | POA: Diagnosis not present

## 2016-09-21 DIAGNOSIS — M069 Rheumatoid arthritis, unspecified: Secondary | ICD-10-CM | POA: Insufficient documentation

## 2016-09-22 DIAGNOSIS — G8221 Paraplegia, complete: Secondary | ICD-10-CM | POA: Diagnosis not present

## 2016-09-22 DIAGNOSIS — M069 Rheumatoid arthritis, unspecified: Secondary | ICD-10-CM | POA: Diagnosis not present

## 2016-09-22 DIAGNOSIS — I10 Essential (primary) hypertension: Secondary | ICD-10-CM | POA: Diagnosis not present

## 2016-09-22 DIAGNOSIS — Z87828 Personal history of other (healed) physical injury and trauma: Secondary | ICD-10-CM | POA: Diagnosis not present

## 2016-09-22 DIAGNOSIS — Z4800 Encounter for change or removal of nonsurgical wound dressing: Secondary | ICD-10-CM | POA: Diagnosis not present

## 2016-09-22 DIAGNOSIS — L8943 Pressure ulcer of contiguous site of back, buttock and hip, stage 3: Secondary | ICD-10-CM | POA: Diagnosis not present

## 2016-09-22 NOTE — Progress Notes (Signed)
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 those days he uses Medihoney. [facility] has [facility] was supplying his dressings. He still complains of drainage. He does his dressings independently. He has in his motorized chair for 2-3 hours that time other than that he offloads this. Dimensions of the wound are down 1 cm in both directions. He underwent an aggressive debridement on his last visit of thick circumferential skin and subcutaneous tissue. It is possible at some point in the future he is going to need this done again 05/24/16; the patient returns today for review of his stage III sacral pressure ulcer. We have been using Aquacel Ag he tells me that he changes this up to twice a day. I'm not really certain of the reason for this frequency of changing. He has some involvement from the home health nurses but I think is doing most of the changing  himself which I think because of his paraplegia would be a very difficult exercise. Nevertheless he states that there is "wetness". I am not sure if there is another dressing that we could easily changed that much. I'd wanted to change to [facility] but I'll need to have a sense of how frequent he would need to change this. 06/14/16; this is a patient returns for review of his stage III sacral pressure ulcer. We have been using Aquacel Ag and over the last 2 visits he has had extensive debridement so of the thick circumferential skin and subcutaneous tissue that surrounds this wound. In spite of this really absolutely no change in the condition of the wound warrants measurements. We have  [facility] home [facility] I believe changing the dressing on Bradley [name], KORFF. ([id number]) 3 occasions the patient states he does this on one occasion himself 06/28/16; this is a patient who has a fairly large stage III sacral pressure ulcer. I changed him to [facility] from Aquacel 2 weeks ago. He returns today in follow-up. In the meantime a nurse from [facility] has calledrequesting ordering of a wound VAC. He had this discussion before. The problem is the proximity of the lowest edge of this wound to the patient's anal opening roughly 3/4 of an inch. Can't see how this can be arranged. Apparently the nurse who is calling has a lot of experience, the question would be then when she is not available would be doing this. I would not have thought that this wound is not amenable to a wound VAC because of this reason 07/12/16; the patient comes in today and I have signed orders for a wound VAC. The home health team through [facility] is convinced that he can benefit from this even though there is close proximity to his anal opening beneath the gluteal clefts. The patient does not have a bowel regimen but states he has a bowel movement every 2 days this will also provide some problem with regards to the vac  seal 07/26/16; the patient never did obtain a [place] wound VAC as he could not afford the $200 per month co- pay we have been using Hydrofera Blue now for 6 weeks or so. No major change in this wound at all. He is still not interested in the concept of plastic surgery. There changing the dressing every second day 08/09/16; the patient arrives with a wound precisely in the same situation. In keeping with the plan I outlined last time extensive debridement with an open curet the surface of this is not completely viable. Still has some degree of surrounding thick skin and subcutaneous tissue. No evidence of infection. Once again I have had a conversation with him about plastic surgery, he is simply not interested. 08/23/16; wound is really no different. Thick circumferential skin and subcutaneous tissue around the wound edge which is a lot better from debridement we did earlier in the year. The surface of the wound looks viable however with a curet there is definitely a gritty surface to this. We use Medihoney for a while, he could not afford Santyl. I don't think we could get a supply of Iodoflex. He talks a little more positively about the concept of plastic surgery which I've gone over with him today 08/31/16;; patient arrives in clinic today with the wound surface really no different there is no changes in dimensions. I debrided today surface on the left upper side of this wound aggressively week ago there is no real change here no evidence of epithelialization. The problem with debridement in the clinic is that he believes from this very liberally. We have been using Sorbact. 09/21/16; absolutely no change in the appearance or measurements of this wound. More recently I've been debrided in this aggressively and using sorbact to see if we could get to a better wound surface. Although this visually looks satisfactory, debridement reveals a very gritty surface to this. However even with  this debridement and removal of thick nonviable skin and subcutaneous tissue from around the large amount of the circumference of this wound we have made absolutely no progress. This may be an offloading issue I'm just not completely certain. It has 2 close proximity in its inferior aspect  to consider negative pressure therapy Objective Constitutional Sitting or standing Blood Pressure is within target range for patient.. Pulse regular and within target range for patient.Marland Kitchen Respirations regular, non-labored and within target range.. Temperature is normal and within the target range for the patient.. Patient's appearance is neat and clean. Appears in no acute distress. Well nourished and well developed.. Vitals Time Taken: 10:18 AM, Temperature: 98.2 F, Pulse: 91 bpm, Respiratory Rate: 16 breaths/min, Bradley [name], Bradley A. ([id number]) Blood Pressure: 129/81 mmHg. General Notes: On exam; absolutely no major changes at this. The wound mentions are about the same in the appearance of the wound is about the same. Even though we have extensively debrided the nonviable tissue from around the circumference of the wound this is not resulted in any additional epithelialization. There is no evidence of surrounding soft tissue infection. Integumentary (Hair, Skin) Wound #5 status is Open. Original cause of wound was Pressure Injury. The wound is located on the Sacrum. The wound measures 11.2cm length x 8.4cm width x 0.5cm depth; 73.89cm^2 area and 36.945cm^3 volume. There is Fat Layer (Subcutaneous Tissue) Exposed exposed. There is no tunneling or undermining noted. There is a large amount of serous drainage noted. The wound margin is thickened. There is large (67-100%) red granulation within the wound bed. There is a small (1-33%) amount of necrotic tissue within the wound bed including Adherent Slough. The periwound skin appearance exhibited: Scarring, Maceration. The periwound skin appearance did not  exhibit: Callus, Crepitus, Excoriation, Induration, Rash, Dry/Scaly, Atrophie Blanche, Cyanosis, Ecchymosis, Hemosiderin Staining, Mottled, Pallor, Rubor, Erythema. Periwound temperature was noted as No Abnormality. Assessment Active Problems ICD-10 L89.153 - Pressure ulcer of sacral region, stage 3 G82.21 - Paraplegia, complete Plan Wound Cleansing: Wound #5 Sacrum: Clean wound with Normal Saline. May Shower, gently pat wound dry prior to applying new dressing. Anesthetic: Wound #5 Sacrum: Topical Lidocaine 4% cream applied to wound bed prior to debridement Skin Barriers/Peri-Wound Care: Wound #5 Sacrum: Skin Prep Primary Wound Dressing: Wound #5 Sacrum: Bradley [name], Bradley [name] ([id number]) Cutimed Sorbact Secondary Dressing: Wound #5 Sacrum: ABD pad Dry Gauze XtraSorb - HHRN to please provide this for patient Dressing Change Frequency: Wound #5 Sacrum: Change dressing every day. Follow-up Appointments: Wound #5 Sacrum: Return Appointment in 2 weeks. Off-Loading: Wound #5 Sacrum: Roho cushion for wheelchair Mattress - continue air mattress Turn and reposition every 2 hours Additional Orders / Instructions: Wound #5 Sacrum: Increase protein intake. Home Health: Wound #5 Sacrum: [place] Nurse may visit PRN to address patient s wound care needs. FACE TO FACE ENCOUNTER: MEDICARE and MEDICAID PATIENTS: I certify that this patient is under my care and that I had a face-to-face encounter that meets the physician face-to-face encounter requirements with this patient on this date. The encounter with the patient was in whole or in part for the following MEDICAL CONDITION: (primary reason for [place]) MEDICAL NECESSITY: I certify, that based on my findings, NURSING services are a medically necessary home health service. HOME BOUND STATUS: I certify that my clinical findings support that this patient is homebound (i.e., Due to illness or  injury, pt requires aid of supportive devices such as crutches, cane, wheelchairs, walkers, the use of special transportation or the assistance of another person to leave their place of residence. There is a normal inability to leave the home and doing so requires considerable and taxing effort. Other absences are for medical reasons / religious services and are infrequent or of short duration when for other  reasons). If current dressing causes regression in wound condition, may D/C ordered dressing product/s and apply Normal Saline Moist Dressing daily until next [place] / Other MD appointment. [place] of regression in wound condition at [phone]. Please direct any NON-WOUND related issues/requests for orders to patient's Primary Care Physician #1 the patient is finally consented to her repeat consultation at [facility] with plastic surgery he is going to make this call and we will assist him if a repeat referral was necessary. He had previously been against the idea of closure of this wound as it would necessitate more aggressive offloading then he probably could do at home and then there is the issue of a colostomy to protect any surgical site. MOSE, COLAIZZI ([id number]) #2 I've given an appointment to come back to see [place] in 2 weeks, I'm thinking that this will take a lot longer to arrange perhaps months. Electronic Signature(s) Signed: 09/21/2016 5:12:22 PM By: Linton Ham MD Entered By: Linton Ham on 09/21/2016 12:04:06 Bradley [name] ([id number]) -------------------------------------------------------------------------------- SuperBill Details Patient Name: Bradley Loa A. Date of Service: 09/21/2016 Medical Record Number: [id number] Patient Account Number: [id number] Date of Birth/Sex: 1945/10/26 (71 y.o. Male) Treating RN: Montey Hora Primary Care Provider: [name] Other Clinician: Referring Provider: [name] Treating Provider/Extender: Ricard Dillon Weeks in Treatment: 29 Diagnosis Coding ICD-10 Codes Code Description L89.153 Pressure ulcer of sacral region, stage 3 G82.21 Paraplegia, complete Facility Procedures CPT4 Code: [id number] Description: 99213 - WOUND CARE VISIT-LEV 3 EST PT Modifier: Quantity: 1 Physician Procedures CPT4 Code: [id number] Description: 67209 - WC PHYS LEVEL 2 - EST PT ICD-10 Description Diagnosis L89.153 Pressure ulcer of sacral region, stage 3 Modifier: Quantity: 1 Electronic Signature(s) Signed: 09/21/2016 12:28:25 PM By: Montey Hora Signed: 09/21/2016 5:12:22 PM By: Linton Ham MD Entered By: Montey Hora on 09/21/2016 47:09:62

## 2016-09-22 NOTE — Progress Notes (Addendum)
CANIO, WINOKUR ([id number]) Visit Report for 09/21/2016 Arrival Information Details Patient Name: Bradley [name], Bradley [name]. Date of Service: 09/21/2016 10:15 AM Medical Record Number: [id number] Patient Account Number: [id number] Date of Birth/Sex: July 19, 1945 (71 y.o. Male) Treating RN: Montey Hora Primary Care Ridwan Bondy: [name] Other Clinician: Referring Jonda Alanis: [name] Treating Robena Ewy/Extender: Tito Dine in Treatment: 75 Visit Information History Since Last Visit Added or deleted any medications: No Patient Arrived: Wheel Chair Any new allergies or adverse reactions: No Arrival Time: 10:15 Had a fall or experienced change in No activities of daily living that may affect Accompanied By: self risk of falls: Transfer Assistance: Hoyer Lift Signs or symptoms of abuse/neglect since last No Patient Identification Verified: Yes visito Secondary Verification Process Yes Hospitalized since last visit: No Completed: Has Dressing in Place as Prescribed: Yes Patient Requires Transmission-Based No Pain Present Now: No Precautions: Patient Has Alerts: No Electronic Signature(s) Signed: 09/21/2016 4:56:33 PM By: Montey Hora Entered By: Montey Hora on 09/21/2016 10:18:28 [name] ([id number]) -------------------------------------------------------------------------------- Clinic Level of Care Assessment Details Patient Name: Bradley Loa A. Date of Service: 09/21/2016 10:15 AM Medical Record Number: [id number] Patient Account Number: [id number] Date of Birth/Sex: 1945/11/16 (71 y.o. Male) Treating RN: Montey Hora Primary Care Shaunda Tipping: [name] Other Clinician: Referring Samyra Limb: [name] Treating Eligah Anello/Extender: Tito Dine in Treatment: 29 Clinic Level of Care Assessment Items TOOL 4 Quantity Score []  - Use when only an EandM is performed on FOLLOW-UP visit 0 ASSESSMENTS - Nursing Assessment /  Reassessment X - Reassessment of Co-morbidities (includes updates in patient status) 1 10 X - Reassessment of Adherence to Treatment Plan 1 5 ASSESSMENTS - Wound and Skin Assessment / Reassessment X - Simple Wound Assessment / Reassessment - one wound 1 5 []  - Complex Wound Assessment / Reassessment - multiple wounds 0 []  - Dermatologic / Skin Assessment (not related to wound area) 0 ASSESSMENTS - Focused Assessment []  - Circumferential Edema Measurements - multi extremities 0 []  - Nutritional Assessment / Counseling / Intervention 0 []  - Lower Extremity Assessment (monofilament, tuning fork, pulses) 0 []  - Peripheral Arterial Disease Assessment (using hand held doppler) 0 ASSESSMENTS - Ostomy and/or Continence Assessment and Care []  - Incontinence Assessment and Management 0 []  - Ostomy Care Assessment and Management (repouching, etc.) 0 PROCESS - Coordination of Care X - Simple Patient / Family Education for ongoing care 1 15 []  - Complex (extensive) Patient / Family Education for ongoing care 0 []  - Staff obtains Programmer, systems, Records, Test Results / Process Orders 0 []  - Staff telephones HHA, Nursing Homes / Clarify orders / etc 0 []  - Routine Transfer to another Facility (non-emergent condition) 0 TYAIRE, ODEM ([id number]) []  - Routine Hospital Admission (non-emergent condition) 0 []  - New Admissions / Biomedical engineer / Ordering NPWT, Apligraf, etc. 0 []  - Emergency Hospital Admission (emergent condition) 0 X - Simple Discharge Coordination 1 10 []  - Complex (extensive) Discharge Coordination 0 PROCESS - Special Needs []  - Pediatric / Minor Patient Management 0 []  - Isolation Patient Management 0 []  - Hearing / Language / Visual special needs 0 []  - Assessment of Community assistance (transportation, D/C planning, etc.) 0 X - Additional assistance / Altered mentation 1 15 []  - Support Surface(s) Assessment (bed, cushion, seat, etc.) 0 INTERVENTIONS - Wound Cleansing /  Measurement X - Simple Wound Cleansing - one wound 1 5 []  - Complex Wound Cleansing - multiple wounds 0 X - Wound Imaging (photographs - any number of wounds)  1 5 []  - Wound Tracing (instead of photographs) 0 X - Simple Wound Measurement - one wound 1 5 []  - Complex Wound Measurement - multiple wounds 0 INTERVENTIONS - Wound Dressings X - Small Wound Dressing one or multiple wounds 1 10 []  - Medium Wound Dressing one or multiple wounds 0 []  - Large Wound Dressing one or multiple wounds 0 []  - Application of Medications - topical 0 []  - Application of Medications - injection 0 INTERVENTIONS - Miscellaneous []  - External ear exam 0 COLEY, KULIKOWSKI A. ([id number]) []  - Specimen Collection (cultures, biopsies, blood, body fluids, etc.) 0 []  - Specimen(s) / Culture(s) sent or taken to Lab for analysis 0 X - Patient Transfer (multiple staff / Harrel Lemon Lift / Similar devices) 1 10 []  - Simple Staple / Suture removal (25 or less) 0 []  - Complex Staple / Suture removal (26 or more) 0 []  - Hypo / Hyperglycemic Management (close monitor of Blood Glucose) 0 []  - Ankle / Brachial Index (ABI) - do not check if billed separately 0 X - Vital Signs 1 5 Has the patient been seen at the hospital within the last three years: Yes Total Score: 100 Level Of Care: New/Established - Level 3 Electronic Signature(s) Signed: 09/21/2016 4:56:33 PM By: Montey Hora Entered By: Montey Hora on 09/21/2016 12:28:09 [name] ([id number]) -------------------------------------------------------------------------------- Encounter Discharge Information Details Patient Name: Bradley Loa A. Date of Service: 09/21/2016 10:15 AM Medical Record Number: [id number] Patient Account Number: [id number] Date of Birth/Sex: April 07, 1946 (71 y.o. Male) Treating RN: Montey Hora Primary Care Tycho Cheramie: [name] Other Clinician: Referring [name] Ackers: [name] Treating Rickelle Sylvestre/Extender: Tito Dine in Treatment: 48 Encounter Discharge Information Items Discharge Pain Level: 0 Discharge Condition: Stable Ambulatory Status: Wheelchair Discharge Destination: Home Transportation: Private Auto Accompanied By: self Schedule Follow-up Appointment: Yes Medication Reconciliation completed and provided to Patient/Care No Rilyn Upshaw: Provided on Clinical Summary of Care: 09/21/2016 Form Type Recipient Paper Patient JG Electronic Signature(s) Signed: 09/21/2016 12:29:18 PM By: Montey Hora Previous Signature: 09/21/2016 11:09:24 AM Version By: Ruthine Dose Entered By: Montey Hora on 09/21/2016 12:29:18 [name] ([id number]) -------------------------------------------------------------------------------- Multi Wound Chart Details Patient Name: Bradley Loa A. Date of Service: 09/21/2016 10:15 AM Medical Record Number: [id number] Patient Account Number: [id number] Date of Birth/Sex: 06-28-1945 (71 y.o. Male) Treating RN: Montey Hora Primary Care Claron Rosencrans: [name] Other Clinician: Referring Lyonel Morejon: [name] Treating Travin Marik/Extender: Tito Dine in Treatment: 29 Vital Signs Height(in): Pulse(bpm): 91 Weight(lbs): Blood Pressure 129/81 (mmHg): Body Mass Index(BMI): Temperature(F): 98.2 Respiratory Rate 16 (breaths/min): Photos: [5:No Photos] [N/A:N/A] Wound Location: [5:Sacrum] [N/A:N/A] Wounding Event: [5:Pressure Injury] [N/A:N/A] Primary Etiology: [5:Pressure Ulcer] [N/A:N/A] Comorbid History: [5:Hypertension, Rheumatoid Arthritis, Paraplegia] [N/A:N/A] Date Acquired: [5:11/05/2014] [N/A:N/A] Weeks of Treatment: [5:29] [N/A:N/A] Wound Status: [5:Open] [N/A:N/A] Measurements L x W x D 11.2x8.4x0.5 [N/A:N/A] (cm) Area (cm) : [5:73.89] [N/A:N/A] Volume (cm) : [5:36.945] [N/A:N/A] % Reduction in Area: [5:-7.50%] [N/A:N/A] % Reduction in Volume: 77.60% [N/A:N/A] Classification: [5:Category/Stage III]  [N/A:N/A] Exudate Amount: [5:Large] [N/A:N/A] Exudate Type: [5:Serous] [N/A:N/A] Exudate Color: [5:amber] [N/A:N/A] Wound Margin: [5:Thickened] [N/A:N/A] Granulation Amount: [5:Large (67-100%)] [N/A:N/A] Granulation Quality: [5:Red] [N/A:N/A] Necrotic Amount: [5:Small (1-33%)] [N/A:N/A] Exposed Structures: [5:Fat Layer (Subcutaneous Tissue) Exposed: Yes Fascia: No Tendon: No Muscle: No] [N/A:N/A] Joint: No Bone: No Epithelialization: None N/A N/A Periwound Skin Texture: Scarring: Yes N/A N/A Excoriation: No Induration: No Callus: No Crepitus: No Rash: No Periwound Skin Maceration: Yes N/A N/A Moisture: Dry/Scaly: No Periwound Skin Color: Atrophie Blanche: No N/A N/A Cyanosis:  No Ecchymosis: No Erythema: No Hemosiderin Staining: No Mottled: No Pallor: No Rubor: No Temperature: No Abnormality N/A N/A Tenderness on No N/A N/A Palpation: Wound Preparation: Ulcer Cleansing: N/A N/A Rinsed/Irrigated with Saline Topical Anesthetic Applied: Other: idocaine 4% Treatment Notes Electronic Signature(s) Signed: 09/21/2016 5:12:22 PM By: Linton Ham MD Entered By: Linton Ham on 09/21/2016 11:58:10 [name] ([id number]) -------------------------------------------------------------------------------- [place] Details Patient Name: DIRON, HADDON A. Date of Service: 09/21/2016 10:15 AM Medical Record Number: [id number] Patient Account Number: [id number] Date of Birth/Sex: Oct 08, 1945 (71 y.o. Male) Treating RN: Montey Hora Primary Care Chrystine Frogge: [name] Other Clinician: Referring Isiah Scheel: [name] Treating Larra Crunkleton/Extender: Tito Dine in Treatment: 53 Active Inactive Electronic Signature(s) Signed: 10/12/2016 4:07:51 PM By: Gretta Cool RN, BSN, Kim RN, BSN Signed: 10/19/2016 8:01:56 AM By: Montey Hora Previous Signature: 09/21/2016 4:56:33 PM Version By: Montey Hora Entered By: Gretta Cool RN, BSN, Kim on  10/12/2016 16:07:50 [name] ([id number]) -------------------------------------------------------------------------------- Pain Assessment Details Patient Name: JAQUES, MINEER A. Date of Service: 09/21/2016 10:15 AM Medical Record Number: [id number] Patient Account Number: [id number] Date of Birth/Sex: 01/21/1946 (71 y.o. Male) Treating RN: Montey Hora Primary Care Emmajean Ratledge: [name] Other Clinician: Referring Lamaj Metoyer: [name] Treating Leonid Manus/Extender: Tito Dine in Treatment: 29 Active Problems Location of Pain Severity and Description of Pain Patient Has Paino No Site Locations Pain Management and Medication Current Pain Management: Notes Topical or injectable lidocaine is offered to patient for acute pain when surgical debridement is performed. If needed, Patient is instructed to use over the counter pain medication for the following 24-48 hours after debridement. Wound care MDs do not prescribed pain medications. Patient has chronic pain or uncontrolled pain. Patient has been instructed to make an appointment with their Primary Care Physician for pain management. Electronic Signature(s) Signed: 09/21/2016 4:56:33 PM By: Montey Hora Entered By: Montey Hora on 09/21/2016 10:18:46 [name] ([id number]) -------------------------------------------------------------------------------- Patient/Caregiver Education Details Patient Name: [name] Date of Service: 09/21/2016 10:15 AM Medical Record Number: [id number] Patient Account Number: [id number] Date of Birth/Gender: [birth year] (71 y.o. Male) Treating RN: Montey Hora Primary Care Physician: [name] Other Clinician: Referring Physician: [name] Treating Physician/Extender: Tito Dine in Treatment: 5 Education Assessment Education Provided To: Patient Education Topics Provided Wound/Skin Impairment: Handouts: Other: given  number for [employer] - call us if need a new referral Methods: Explain/Verbal, Printed Responses: State content correctly Electronic Signature(s) Signed: 09/21/2016 4:56:33 PM By: Montey Hora Entered By: Montey Hora on 09/21/2016 12:29:51 [name] ([id number]) -------------------------------------------------------------------------------- Wound Assessment Details Patient Name: Bradley Loa A. Date of Service: 09/21/2016 10:15 AM Medical Record Number: [id number] Patient Account Number: [id number] Date of Birth/Sex: 1945-10-19 (71 y.o. Male) Treating RN: Montey Hora Primary Care Aarian Cleaver: [name] Other Clinician: Referring Jaxsyn Catalfamo: [name] Treating Abigail Marsiglia/Extender: Ricard Dillon Weeks in Treatment: 29 Wound Status Wound Number: 5 Primary Pressure Ulcer Etiology: Wound Location: Sacrum Wound Status: Open Wounding Event: Pressure Injury Comorbid Hypertension, Rheumatoid Arthritis, Date Acquired: 11/05/2014 History: Paraplegia Weeks Of Treatment: 29 Clustered Wound: No Photos Photo Uploaded By: Montey Hora on 09/21/2016 12:26:40 Wound Measurements Length: (cm) 11.2 Width: (cm) 8.4 Depth: (cm) 0.5 Area: (cm) 73.89 Volume: (cm) 36.945 % Reduction in Area: -7.5% % Reduction in Volume: 77.6% Epithelialization: None Tunneling: No Undermining: No Wound Description Classification: Category/Stage III Wound Margin: Thickened Exudate Amount: Large Exudate Type: Serous Exudate Color: amber Foul Odor After Cleansing: No Slough/Fibrino Yes Wound Bed Granulation Amount: Large (67-100%) Exposed Structure Granulation Quality:  Red Fascia Exposed: No Necrotic Amount: Small (1-33%) Fat Layer (Subcutaneous Tissue) Exposed: Yes Necrotic Quality: Adherent Slough Tendon Exposed: No SHADRICK, SENNE A. ([id number]) Muscle Exposed: No Joint Exposed: No Bone Exposed: No Periwound Skin Texture Texture Color No Abnormalities Noted:  No No Abnormalities Noted: No Callus: No Atrophie Blanche: No Crepitus: No Cyanosis: No Excoriation: No Ecchymosis: No Induration: No Erythema: No Rash: No Hemosiderin Staining: No Scarring: Yes Mottled: No Pallor: No Moisture Rubor: No No Abnormalities Noted: No Dry / Scaly: No Temperature / Pain Maceration: Yes Temperature: No Abnormality Wound Preparation Ulcer Cleansing: Rinsed/Irrigated with Saline Topical Anesthetic Applied: Other: idocaine 4%, Electronic Signature(s) Signed: 09/21/2016 4:56:33 PM By: Montey Hora Entered By: Montey Hora on 09/21/2016 10:31:25 [name] ([id number]) -------------------------------------------------------------------------------- [place] Details Patient Name: Bradley Loa A. Date of Service: 09/21/2016 10:15 AM Medical Record Number: [id number] Patient Account Number: [id number] Date of Birth/Sex: 1946-05-09 (70 y.o. Male) Treating RN: Montey Hora Primary Care Alani Sabbagh: [name] Other Clinician: Referring Alexine Pilant: [name] Treating Starlet Gallentine/Extender: Ricard Dillon Weeks in Treatment: 29 Vital Signs Time Taken: 10:18 Temperature (F): 98.2 Pulse (bpm): 91 Respiratory Rate (breaths/min): 16 Blood Pressure (mmHg): 129/81 Reference Range: 80 - 120 mg / dl Electronic Signature(s) Signed: 09/21/2016 4:56:33 PM By: Montey Hora Entered By: Montey Hora on 09/21/2016 10:19:21

## 2016-09-28 DIAGNOSIS — Z87828 Personal history of other (healed) physical injury and trauma: Secondary | ICD-10-CM | POA: Diagnosis not present

## 2016-09-28 DIAGNOSIS — L8943 Pressure ulcer of contiguous site of back, buttock and hip, stage 3: Secondary | ICD-10-CM | POA: Diagnosis not present

## 2016-09-28 DIAGNOSIS — I10 Essential (primary) hypertension: Secondary | ICD-10-CM | POA: Diagnosis not present

## 2016-09-28 DIAGNOSIS — M069 Rheumatoid arthritis, unspecified: Secondary | ICD-10-CM | POA: Diagnosis not present

## 2016-09-28 DIAGNOSIS — G8221 Paraplegia, complete: Secondary | ICD-10-CM | POA: Diagnosis not present

## 2016-09-28 DIAGNOSIS — Z4800 Encounter for change or removal of nonsurgical wound dressing: Secondary | ICD-10-CM | POA: Diagnosis not present

## 2016-10-03 DIAGNOSIS — I10 Essential (primary) hypertension: Secondary | ICD-10-CM | POA: Diagnosis not present

## 2016-10-03 DIAGNOSIS — Z4800 Encounter for change or removal of nonsurgical wound dressing: Secondary | ICD-10-CM | POA: Diagnosis not present

## 2016-10-03 DIAGNOSIS — Z87828 Personal history of other (healed) physical injury and trauma: Secondary | ICD-10-CM | POA: Diagnosis not present

## 2016-10-03 DIAGNOSIS — M069 Rheumatoid arthritis, unspecified: Secondary | ICD-10-CM | POA: Diagnosis not present

## 2016-10-03 DIAGNOSIS — G8221 Paraplegia, complete: Secondary | ICD-10-CM | POA: Diagnosis not present

## 2016-10-03 DIAGNOSIS — L8943 Pressure ulcer of contiguous site of back, buttock and hip, stage 3: Secondary | ICD-10-CM | POA: Diagnosis not present

## 2016-10-05 ENCOUNTER — Ambulatory Visit: Payer: Medicare HMO | Admitting: Internal Medicine

## 2016-10-11 DIAGNOSIS — S31809S Unspecified open wound of unspecified buttock, sequela: Secondary | ICD-10-CM | POA: Diagnosis not present

## 2016-10-11 DIAGNOSIS — R159 Full incontinence of feces: Secondary | ICD-10-CM | POA: Diagnosis not present

## 2016-10-11 DIAGNOSIS — L89154 Pressure ulcer of sacral region, stage 4: Secondary | ICD-10-CM | POA: Diagnosis not present

## 2016-10-13 DIAGNOSIS — L8943 Pressure ulcer of contiguous site of back, buttock and hip, stage 3: Secondary | ICD-10-CM | POA: Diagnosis not present

## 2016-10-13 DIAGNOSIS — Z4800 Encounter for change or removal of nonsurgical wound dressing: Secondary | ICD-10-CM | POA: Diagnosis not present

## 2016-10-13 DIAGNOSIS — M069 Rheumatoid arthritis, unspecified: Secondary | ICD-10-CM | POA: Diagnosis not present

## 2016-10-13 DIAGNOSIS — Z87828 Personal history of other (healed) physical injury and trauma: Secondary | ICD-10-CM | POA: Diagnosis not present

## 2016-10-13 DIAGNOSIS — Z87440 Personal history of urinary (tract) infections: Secondary | ICD-10-CM | POA: Diagnosis not present

## 2016-10-13 DIAGNOSIS — G8221 Paraplegia, complete: Secondary | ICD-10-CM | POA: Diagnosis not present

## 2016-10-13 DIAGNOSIS — I10 Essential (primary) hypertension: Secondary | ICD-10-CM | POA: Diagnosis not present

## 2016-10-15 DIAGNOSIS — M25572 Pain in left ankle and joints of left foot: Secondary | ICD-10-CM | POA: Diagnosis not present

## 2016-10-20 DIAGNOSIS — G8221 Paraplegia, complete: Secondary | ICD-10-CM | POA: Diagnosis not present

## 2016-10-20 DIAGNOSIS — Z87440 Personal history of urinary (tract) infections: Secondary | ICD-10-CM | POA: Diagnosis not present

## 2016-10-20 DIAGNOSIS — I10 Essential (primary) hypertension: Secondary | ICD-10-CM | POA: Diagnosis not present

## 2016-10-20 DIAGNOSIS — M069 Rheumatoid arthritis, unspecified: Secondary | ICD-10-CM | POA: Diagnosis not present

## 2016-10-20 DIAGNOSIS — L8943 Pressure ulcer of contiguous site of back, buttock and hip, stage 3: Secondary | ICD-10-CM | POA: Diagnosis not present

## 2016-10-20 DIAGNOSIS — Z87828 Personal history of other (healed) physical injury and trauma: Secondary | ICD-10-CM | POA: Diagnosis not present

## 2016-10-20 DIAGNOSIS — Z4800 Encounter for change or removal of nonsurgical wound dressing: Secondary | ICD-10-CM | POA: Diagnosis not present

## 2016-10-26 ENCOUNTER — Other Ambulatory Visit
Admission: RE | Admit: 2016-10-26 | Discharge: 2016-10-26 | Disposition: A | Discharge status: Home / Self Care | Payer: Medicare HMO | Source: Ambulatory Visit | Attending: Internal Medicine | Admitting: Internal Medicine

## 2016-10-26 ENCOUNTER — Inpatient Hospital Stay: Admit: 2016-10-26 | Payer: Self-pay

## 2016-10-26 ENCOUNTER — Encounter: Payer: Medicare HMO | Attending: Internal Medicine | Admitting: Internal Medicine

## 2016-10-26 DIAGNOSIS — N319 Neuromuscular dysfunction of bladder, unspecified: Secondary | ICD-10-CM | POA: Diagnosis not present

## 2016-10-26 DIAGNOSIS — B9620 Unspecified Escherichia coli [E. coli] as the cause of diseases classified elsewhere: Secondary | ICD-10-CM | POA: Diagnosis not present

## 2016-10-26 DIAGNOSIS — L89154 Pressure ulcer of sacral region, stage 4: Secondary | ICD-10-CM | POA: Diagnosis present

## 2016-10-26 DIAGNOSIS — R918 Other nonspecific abnormal finding of lung field: Secondary | ICD-10-CM | POA: Diagnosis not present

## 2016-10-26 DIAGNOSIS — A499 Bacterial infection, unspecified: Secondary | ICD-10-CM | POA: Diagnosis not present

## 2016-10-26 DIAGNOSIS — M069 Rheumatoid arthritis, unspecified: Secondary | ICD-10-CM | POA: Insufficient documentation

## 2016-10-26 DIAGNOSIS — B999 Unspecified infectious disease: Secondary | ICD-10-CM | POA: Diagnosis present

## 2016-10-26 DIAGNOSIS — L89159 Pressure ulcer of sacral region, unspecified stage: Secondary | ICD-10-CM | POA: Diagnosis not present

## 2016-10-26 DIAGNOSIS — G8220 Paraplegia, unspecified: Secondary | ICD-10-CM | POA: Insufficient documentation

## 2016-10-26 DIAGNOSIS — Z66 Do not resuscitate: Secondary | ICD-10-CM | POA: Diagnosis not present

## 2016-10-26 DIAGNOSIS — L03317 Cellulitis of buttock: Secondary | ICD-10-CM | POA: Diagnosis not present

## 2016-10-26 DIAGNOSIS — R509 Fever, unspecified: Secondary | ICD-10-CM | POA: Diagnosis not present

## 2016-10-26 DIAGNOSIS — M4628 Osteomyelitis of vertebra, sacral and sacrococcygeal region: Secondary | ICD-10-CM | POA: Insufficient documentation

## 2016-10-26 DIAGNOSIS — R000 Tachycardia, unspecified: Secondary | ICD-10-CM | POA: Diagnosis not present

## 2016-10-26 DIAGNOSIS — R279 Unspecified lack of coordination: Secondary | ICD-10-CM | POA: Diagnosis not present

## 2016-10-26 DIAGNOSIS — T814XXA Infection following a procedure, initial encounter: Secondary | ICD-10-CM | POA: Diagnosis not present

## 2016-10-26 DIAGNOSIS — I10 Essential (primary) hypertension: Secondary | ICD-10-CM | POA: Insufficient documentation

## 2016-10-26 DIAGNOSIS — R61 Generalized hyperhidrosis: Secondary | ICD-10-CM | POA: Diagnosis not present

## 2016-10-26 DIAGNOSIS — D508 Other iron deficiency anemias: Secondary | ICD-10-CM | POA: Diagnosis not present

## 2016-10-26 DIAGNOSIS — J984 Other disorders of lung: Secondary | ICD-10-CM | POA: Diagnosis not present

## 2016-10-26 DIAGNOSIS — R5381 Other malaise: Secondary | ICD-10-CM | POA: Diagnosis not present

## 2016-10-26 DIAGNOSIS — Z87891 Personal history of nicotine dependence: Secondary | ICD-10-CM | POA: Insufficient documentation

## 2016-10-26 DIAGNOSIS — L089 Local infection of the skin and subcutaneous tissue, unspecified: Secondary | ICD-10-CM | POA: Diagnosis not present

## 2016-10-26 DIAGNOSIS — L89149 Pressure ulcer of left lower back, unspecified stage: Secondary | ICD-10-CM | POA: Diagnosis not present

## 2016-10-26 DIAGNOSIS — M869 Osteomyelitis, unspecified: Secondary | ICD-10-CM | POA: Diagnosis not present

## 2016-10-26 DIAGNOSIS — N400 Enlarged prostate without lower urinary tract symptoms: Secondary | ICD-10-CM | POA: Diagnosis not present

## 2016-10-26 DIAGNOSIS — A419 Sepsis, unspecified organism: Secondary | ICD-10-CM | POA: Diagnosis not present

## 2016-10-26 DIAGNOSIS — Z741 Need for assistance with personal care: Secondary | ICD-10-CM | POA: Diagnosis not present

## 2016-10-26 DIAGNOSIS — L03312 Cellulitis of back [any part except buttock]: Secondary | ICD-10-CM | POA: Diagnosis not present

## 2016-10-26 DIAGNOSIS — M6281 Muscle weakness (generalized): Secondary | ICD-10-CM | POA: Diagnosis not present

## 2016-10-27 NOTE — Progress Notes (Addendum)
AERO, DRUMMONDS ([id number]) Visit Report for 10/26/2016 Allergy List Details Patient Name: Bradley [name], Bradley [name]. Date of Service: 10/26/2016 12:45 PM Medical Record Number: [id number] Patient Account Number: [id number] Date of Birth/Sex: 1945/10/30 (71 y.o. Male) Treating RN: Baruch Gouty, RN, BSN, [place] Primary [facility] Jaymes Hang: [name] Other Clinician: Referring Shemeca Lukasik: [name] Treating Shirly Bartosiewicz/Extender: Ricard Dillon Weeks in Treatment: 0 Allergies Active Allergies No Known Allergies Allergy Notes Electronic Signature(s) Signed: 10/26/2016 12:40:00 PM By: Regan Lemming BSN, RN Entered By: Regan Lemming on 10/26/2016 12:40:00 [name] ([id number]) -------------------------------------------------------------------------------- Arrival Information Details Patient Name: Bradley Loa A. Date of Service: 10/26/2016 12:45 PM Medical Record Number: [id number] Patient Account Number: [id number] Date of Birth/Sex: [birth year] (71 y.o. Male) Treating RN: Baruch Gouty, RN, BSN, [place] Primary Care Princella Jaskiewicz: [name] Other Clinician: Referring Ciel Yanes: [name] Treating Zeidy Tayag/Extender: Tito Dine in Treatment: 0 Visit Information Patient Arrived: Wheel Chair Arrival Time: 12:44 Accompanied By: self Transfer Assistance: [place] Patient Identification Verified: Yes Secondary Verification Process Yes Completed: Patient Requires Transmission-Based No Precautions: Patient Has Alerts: No History Since Last Visit All ordered tests and consults were completed: No Added or deleted any medications: No Any new allergies or adverse reactions: No Had a fall or experienced change in activities of daily living that may affect risk of falls: No Signs or symptoms of abuse/neglect since last visito No Hospitalized since last visit: No Electronic Signature(s) Signed: 10/26/2016 5:08:53 PM By: Regan Lemming BSN, RN Entered By: Regan Lemming on 10/26/2016  12:47:43 [name] ([id number]) -------------------------------------------------------------------------------- Clinic Level of Care Assessment Details Patient Name: Bradley Loa A. Date of Service: 10/26/2016 12:45 PM Medical Record Number: [id number] Patient Account Number: [id number] Date of Birth/Sex: [birth year] (71 y.o. Male) Treating RN: Baruch Gouty, RN, BSN, [place] Primary Care Deyja Sochacki: [name] Other Clinician: Referring Thelmer Legler: [name] Treating Loreli Debruler/Extender: Tito Dine in Treatment: 0 Clinic Level of Care Assessment Items TOOL 1 Quantity Score []  - Use when EandM and Procedure is performed on INITIAL visit 0 ASSESSMENTS - Nursing Assessment / Reassessment X - General Physical Exam (combine w/ comprehensive assessment (listed just 1 20 below) when performed on new pt. evals) X - Comprehensive Assessment (HX, ROS, Risk Assessments, Wounds Hx, etc.) 1 25 ASSESSMENTS - Wound and Skin Assessment / Reassessment []  - Dermatologic / Skin Assessment (not related to wound area) 0 ASSESSMENTS - Ostomy and/or Continence Assessment and Care []  - Incontinence Assessment and Management 0 []  - Ostomy Care Assessment and Management (repouching, etc.) 0 PROCESS - Coordination of Care X - Simple Patient / Family Education for ongoing care 1 15 []  - Complex (extensive) Patient / Family Education for ongoing care 0 X - Staff obtains Programmer, systems, Records, Test Results / Process Orders 1 10 []  - Staff telephones HHA, Nursing Homes / Clarify orders / etc 0 []  - Routine Transfer to another Facility (non-emergent condition) 0 []  - Routine Hospital Admission (non-emergent condition) 0 X - New Admissions / Biomedical engineer / Ordering NPWT, Apligraf, etc. 1 15 []  - Emergency Hospital Admission (emergent condition) 0 PROCESS - Special Needs []  - Pediatric / Minor Patient Management 0 []  - Isolation Patient Management 0 Bradley [name], Bradley A. ([id number]) []  -  Hearing / Language / Visual special needs 0 []  - Assessment of Community assistance (transportation, D/C planning, etc.) 0 []  - Additional assistance / Altered mentation 0 []  - Support Surface(s) Assessment (bed, cushion, seat, etc.) 0 INTERVENTIONS - Miscellaneous []  - External ear exam 0 X - Patient Transfer (multiple  staff / Harrel Lemon Lift / Similar devices) 1 10 []  - Simple Staple / Suture removal (25 or less) 0 []  - Complex Staple / Suture removal (26 or more) 0 []  - Hypo/Hyperglycemic Management (do not check if billed separately) 0 []  - Ankle / Brachial Index (ABI) - do not check if billed separately 0 Has the patient been seen at the hospital within the last three years: Yes Total Score: 95 Level Of Care: New/Established - Level 3 Electronic Signature(s) Signed: 10/27/2016 1:50:23 PM By: Regan Lemming BSN, RN Entered By: Regan Lemming on 10/26/2016 17:11:27 [name] ([id number]) -------------------------------------------------------------------------------- Encounter Discharge Information Details Patient Name: Bradley Loa A. Date of Service: 10/26/2016 12:45 PM Medical Record Number: [id number] Patient Account Number: [id number] Date of Birth/Sex: 1945-08-29 (71 y.o. Male) Treating RN: Baruch Gouty, RN, BSN, [place] Primary Care Nakema Fake: [name] Other Clinician: Referring Jaselyn Nahm: [name] Treating Tri Chittick/Extender: Tito Dine in Treatment: 0 Encounter Discharge Information Items Discharge Pain Level: 0 Discharge Condition: Stable Ambulatory Status: Wheelchair Discharge Destination: Home Transportation: Ambulance Accompanied By: self Schedule Follow-up Appointment: No Medication Reconciliation completed and provided to Patient/Care No Kaushik Maul: Provided on Clinical Summary of Care: 10/26/2016 Form Type Recipient Paper Patient JG Electronic Signature(s) Signed: 10/26/2016 5:13:03 PM By: Regan Lemming BSN, RN Previous Signature: 10/26/2016  1:55:21 PM Version By: Ruthine Dose Entered By: Regan Lemming on 10/26/2016 17:13:03 [name] ([id number]) -------------------------------------------------------------------------------- Lower Extremity Assessment Details Patient Name: Bradley Loa A. Date of Service: 10/26/2016 12:45 PM Medical Record Number: [id number] Patient Account Number: [id number] Date of Birth/Sex: 06-14-1945 (71 y.o. Male) Treating RN: Baruch Gouty, RN, BSN, [place] Primary Care Keitha Kolk: [name] Other Clinician: Referring Camrin Lapre: [name] Treating Ahnya Akre/Extender: Ricard Dillon Weeks in Treatment: 0 Electronic Signature(s) Signed: 10/26/2016 5:08:53 PM By: Regan Lemming BSN, RN Entered By: Regan Lemming on 10/26/2016 12:50:22 [name] ([id number]) -------------------------------------------------------------------------------- Multi Wound Chart Details Patient Name: Bradley [name], Bradley A. Date of Service: 10/26/2016 12:45 PM Medical Record Number: [id number] Patient Account Number: [id number] Date of Birth/Sex: [birth year] (71 y.o. Male) Treating RN: Baruch Gouty, RN, BSN, [place] Primary [facility] Maxson Oddo: [name] Other Clinician: Referring Triton Heidrich: [name] Treating Yailene Badia/Extender: Ricard Dillon Weeks in Treatment: 0 Vital Signs Height(in): 75 Pulse(bpm): 105 Weight(lbs): Blood Pressure 123/60 (mmHg): Body Mass Index(BMI): Temperature(F): 101.4 Respiratory Rate 19 (breaths/min): Photos: [6:No Photos] [N/A:N/A] Wound Location: [6:Sacrum] [N/A:N/A] Wounding Event: [6:Pressure Injury] [N/A:N/A] Primary Etiology: [6:Pressure Ulcer] [N/A:N/A] Comorbid History: [6:Anemia, Hypertension, History of pressure wounds, Rheumatoid Arthritis, Paraplegia] [N/A:N/A] Date Acquired: [6:01/19/2016] [N/A:N/A] Weeks of Treatment: [6:0] [N/A:N/A] Wound Status: [6:Open] [N/A:N/A] Measurements L x W x D 15x14x4.5 [N/A:N/A] (cm) Area (cm) : [8:841.660] [N/A:N/A] Volume (cm)  : [6:301.601] [N/A:N/A] % Reduction in Area: [6:0.00%] [N/A:N/A] % Reduction in Volume: 0.00% [N/A:N/A] Classification: [6:Category/Stage IV] [N/A:N/A] Exudate Amount: [6:Large] [N/A:N/A] Exudate Type: [6:Purulent] [N/A:N/A] Exudate Color: [6:yellow, brown, green] [N/A:N/A] Foul Odor After [6:Yes] [N/A:N/A] Cleansing: Odor Anticipated Due to No [N/A:N/A] Product Use: Wound Margin: [6:Flat and Intact] [N/A:N/A] Granulation Amount: [6:Small (1-33%)] [N/A:N/A] Granulation Quality: [6:Pink, Pale] [N/A:N/A] Necrotic Amount: [6:Large (67-100%)] [N/A:N/A] Necrotic Tissue: [6:Eschar] [N/A:N/A] Exposed Structures: Fat Layer (Subcutaneous N/A N/A Tissue) Exposed: Yes Muscle: Yes Bone: Yes Fascia: No Tendon: No Joint: No Epithelialization: None N/A N/A Debridement: Debridement (09323- N/A N/A 11047) Pre-procedure 13:23 N/A N/A Verification/Time Out Taken: Pain Control: Lidocaine 4% Topical N/A N/A Solution Tissue Debrided: Necrotic/Eschar, N/A N/A Fibrin/Slough, Muscle, Subcutaneous Level: Skin/Subcutaneous N/A N/A Tissue/Muscle Debridement Area (sq 16 N/A N/A cm): Instrument: Blade, Forceps N/A N/A Bleeding: Large  N/A N/A Hemostasis Achieved: Pressure N/A N/A Procedural Pain: 0 N/A N/A Post Procedural Pain: 0 N/A N/A Debridement Treatment Procedure was tolerated N/A N/A Response: well Post Debridement 15x14x4.5 N/A N/A Measurements L x W x D (cm) Post Debridement 742.201 N/A N/A Volume: (cm) Post Debridement Category/Stage IV N/A N/A Stage: Periwound Skin Texture: Excoriation: No N/A N/A Induration: No Callus: No Crepitus: No Rash: No Scarring: No Periwound Skin Maceration: No N/A N/A Moisture: Dry/Scaly: No Periwound Skin Color: Atrophie Blanche: No N/A N/A Cyanosis: No Ecchymosis: No Erythema: No Hemosiderin Staining: No Mottled: No Bradley [name], Bradley A. ([id number]) Pallor: No Rubor: No Temperature: No Abnormality N/A N/A Tenderness on No N/A  N/A Palpation: Wound Preparation: Ulcer Cleansing: N/A N/A Rinsed/Irrigated with Saline Topical Anesthetic Applied: Other: lidocaine 4% Procedures Performed: Biopsy N/A N/A Debridement Treatment Notes Electronic Signature(s) Signed: 10/26/2016 5:34:29 PM By: Linton Ham MD Entered By: Linton Ham on 10/26/2016 14:35:41 [name] ([id number]) -------------------------------------------------------------------------------- [place] Details Patient Name: Bradley [name], Bradley A. Date of Service: 10/26/2016 12:45 PM Medical Record Number: [id number] Patient Account Number: [id number] Date of Birth/Sex: May 30, 1946 (71 y.o. Male) Treating RN: Baruch Gouty, RN, BSN, [place] Primary Care Laquenta Whitsell: [name] Other Clinician: Referring Jerolene Kupfer: [name] Treating Ladeja Pelham/Extender: Tito Dine in Treatment: 0 Active Inactive ` Abuse / Safety / Falls / Self Care Management Nursing Diagnoses: Impaired home maintenance Impaired physical mobility Potential for falls Self care deficit: actual or potential Goals: Patient will not experience any injury related to falls Date Initiated: 10/26/2016 Target Resolution Date: 02/26/2017 Goal Status: Active Patient will remain injury free related to falls Date Initiated: 10/26/2016 Target Resolution Date: 02/26/2017 Goal Status: Active Patient/caregiver will identify factors that restrict self-care and home management Date Initiated: 10/26/2016 Target Resolution Date: 02/26/2017 Goal Status: Active Patient/caregiver will verbalize understanding of skin care regimen Date Initiated: 10/26/2016 Target Resolution Date: 02/26/2017 Goal Status: Active Patient/caregiver will verbalize/demonstrate measure taken to improve self care Date Initiated: 10/26/2016 Target Resolution Date: 02/26/2017 Goal Status: Active Patient/caregiver will verbalize/demonstrate measures taken to improve the patient's personal  safety Date Initiated: 10/26/2016 Target Resolution Date: 02/26/2017 Goal Status: Active Patient/caregiver will verbalize/demonstrate measures taken to prevent injury and/or falls Date Initiated: 10/26/2016 Target Resolution Date: 02/26/2017 Goal Status: Active Patient/caregiver will verbalize/demonstrate understanding of what to do in case of emergency Date Initiated: 10/26/2016 Target Resolution Date: 02/26/2017 Goal Status: Active Bradley [name], Bradley [name] ([id number]) Interventions: Call light and/or bell within patient's reach Assess Activities of Daily Living upon admission and as needed Assess fall risk on admission and as needed Assess: immobility, friction, shearing, incontinence upon admission and as needed Assess impairment of mobility on admission and as needed per policy Assess personal safety and home safety (as indicated) on admission and as needed Assess self care needs on admission and as needed Provide education on basic hygiene Provide education on fall prevention Provide education on personal and home safety Provide education on safe transfers Treatment Activities: Patient referred to home care : 10/26/2016 Notes: ` Orientation to the Wound Care Program Nursing Diagnoses: Knowledge deficit related to the wound healing center program Goals: Patient/caregiver will verbalize understanding of the [place] Date Initiated: 10/26/2016 Target Resolution Date: 02/26/2017 Goal Status: Active Interventions: Provide education on orientation to the wound center Notes: ` Pressure Nursing Diagnoses: Knowledge deficit related to causes and risk factors for pressure ulcer development Knowledge deficit related to management of pressures ulcers Potential for impaired tissue integrity related to pressure, friction, moisture, and shear Goals: Patient will  remain free from development of additional pressure ulcers Date Initiated: 10/26/2016 Target Resolution Date:  02/26/2017 Bradley [name], Bradley [name] ([id number]) Goal Status: Active Patient/caregiver will verbalize understanding of pressure ulcer management Date Initiated: 10/26/2016 Target Resolution Date: 02/26/2017 Goal Status: Active Interventions: Assess: immobility, friction, shearing, incontinence upon admission and as needed Assess offloading mechanisms upon admission and as needed Assess potential for pressure ulcer upon admission and as needed Provide education on pressure ulcers Notes: Electronic Signature(s) Signed: 10/26/2016 1:17:27 PM By: Regan Lemming BSN, RN Entered By: Regan Lemming on 10/26/2016 13:17:25 [name] ([id number]) -------------------------------------------------------------------------------- Pain Assessment Details Patient Name: Bradley Loa A. Date of Service: 10/26/2016 12:45 PM Medical Record Number: [id number] Patient Account Number: [id number] Date of Birth/Sex: Jun 23, 1945 (71 y.o. Male) Treating RN: Baruch Gouty, RN, BSN, [place] Primary Care Tymara Saur: [name] Other Clinician: Referring Alarik Radu: [name] Treating Ricco Dershem/Extender: Ricard Dillon Weeks in Treatment: 0 Active Problems Location of Pain Severity and Description of Pain Patient Has Paino No Site Locations With Dressing Change: No Pain Management and Medication Current Pain Management: Electronic Signature(s) Signed: 10/26/2016 5:08:53 PM By: Regan Lemming BSN, RN Entered By: Regan Lemming on 10/26/2016 12:47:52 [name] ([id number]) -------------------------------------------------------------------------------- Patient/Caregiver Education Details Patient Name: Bradley Loa A. Date of Service: 10/26/2016 12:45 PM Medical Record Number: [id number] Patient Account Number: [id number] Date of Birth/Gender: 1945/09/05 (71 y.o. Male) Treating RN: Baruch Gouty, RN, BSN, [facility] Primary Care Physician: [name] Other Clinician: Referring Physician: [name] Treating  Physician/Extender: Tito Dine in Treatment: 0 Education Assessment Education Provided To: Patient Education Topics Provided Basic Hygiene: Methods: Explain/Verbal Responses: State content correctly Pressure: Methods: Explain/Verbal Responses: State content correctly Safety: Methods: Explain/Verbal Responses: State content correctly Welcome To [facility]: Methods: Explain/Verbal Responses: State content correctly Wound Debridement: Methods: Explain/Verbal Responses: State content correctly Wound/Skin Impairment: Methods: Explain/Verbal Responses: State content correctly Electronic Signature(s) Signed: 10/27/2016 1:50:23 PM By: Regan Lemming BSN, RN Entered By: Regan Lemming on 10/26/2016 17:13:37 [name] ([id number]) -------------------------------------------------------------------------------- Wound Assessment Details Patient Name: Bradley Loa A. Date of Service: 10/26/2016 12:45 PM Medical Record Number: [id number] Patient Account Number: [id number] Date of Birth/Sex: [birth year] (71 y.o. Male) Treating RN: Baruch Gouty, RN, BSN, [place] Primary Care Darlys Buis: [name] Other Clinician: Referring Kaelene Elliston: [name] Treating Quanetta Truss/Extender: Ricard Dillon Weeks in Treatment: 0 Wound Status Wound Number: 6 Primary Pressure Ulcer Etiology: Wound Location: Sacrum Wound Open Wounding Event: Pressure Injury Status: Date Acquired: 01/19/2016 Comorbid Anemia, Hypertension, History of Weeks Of Treatment: 0 History: pressure wounds, Rheumatoid Arthritis, Clustered Wound: No Paraplegia Photos Photo Uploaded By: Regan Lemming on 10/26/2016 17:03:43 Wound Measurements Length: (cm) 15 Width: (cm) 14 Depth: (cm) 4.5 Area: (cm) 164.934 Volume: (cm) 742.201 % Reduction in Area: 0% % Reduction in Volume: 0% Epithelialization: None Tunneling: No Undermining: No Wound Description Classification: Category/Stage IV Wound  Margin: Flat and Intact Exudate Amount: Large Exudate Type: Purulent Bradley [name], Bradley [name] ([id number]) Foul Odor After Cleansing: Yes Due to Product Use: No Slough/Fibrino Yes Exudate Color: yellow, brown, green Wound Bed Granulation Amount: Small (1-33%) Exposed Structure Granulation Quality: Pink, Pale Fascia Exposed: No Necrotic Amount: Large (67-100%) Fat Layer (Subcutaneous Tissue) Exposed: Yes Necrotic Quality: Eschar Tendon Exposed: No Muscle Exposed: Yes Necrosis of Muscle: Yes Joint Exposed: No Bone Exposed: Yes Periwound Skin Texture Texture Color No Abnormalities Noted: No No Abnormalities Noted: No Callus: No Atrophie Blanche: No Crepitus: No Cyanosis: No Excoriation: No Ecchymosis: No Induration: No Erythema: No Rash: No Hemosiderin Staining: No Scarring: No Mottled: No  Pallor: No Moisture Rubor: No No Abnormalities Noted: No Dry / Scaly: No Temperature / Pain Maceration: No Temperature: No Abnormality Wound Preparation Ulcer Cleansing: Rinsed/Irrigated with Saline Topical Anesthetic Applied: Other: lidocaine 4%, Treatment Notes Wound #6 (Sacrum) 1. Cleansed with: Clean wound with Normal Saline 4. Dressing Applied: Aquacel Ag 5. Secondary Dressing Applied ABD Pad Dry Gauze 7. Secured with Recruitment consultant) Signed: 10/26/2016 5:08:53 PM By: Regan Lemming BSN, RN Entered By: Regan Lemming on 10/26/2016 13:35:59 [name] ([id number]) -------------------------------------------------------------------------------- Vitals Details Patient Name: Bradley Loa A. Date of Service: 10/26/2016 12:45 PM Medical Record Number: [id number] Patient Account Number: [id number] Date of Birth/Sex: [birth year] (71 y.o. Male) Treating RN: Baruch Gouty, RN, BSN, [facility] Primary Care Gresia Isidoro: [name] Other Clinician: Referring Monzerrath Mcburney: [name] Treating Lelend Heinecke/Extender: Tito Dine in Treatment: 0 Vital Signs Time Taken:  12:48 Temperature (F): 101.4 Height (in): 75 Pulse (bpm): 105 Source: Stated Respiratory Rate (breaths/min): 19 Blood Pressure (mmHg): 123/60 Reference Range: 80 - 120 mg / dl Electronic Signature(s) Signed: 10/26/2016 5:08:53 PM By: Regan Lemming BSN, RN Entered By: Regan Lemming on 10/26/2016 13:26:30

## 2016-10-27 NOTE — Progress Notes (Addendum)
Bradley [name] ([id number]) Visit Report for 10/26/2016 Abuse/Suicide Risk Screen Details Patient Name: Bradley [name], Bradley [name]. Date of Service: 10/26/2016 12:45 PM Medical Record Number: [id number] Patient Account Number: [id number] Date of Birth/Sex: July 28, 1945 (71 y.o. Male) Treating RN: Baruch Gouty, RN, BSN, [place] [facility] Dywane Peruski: [name] Other Clinician: Referring Clea Dubach: [name] Treating Chesnie Capell/Extender: Ricard Dillon Weeks in Treatment: 0 Abuse/Suicide Risk Screen Items Answer ABUSE/SUICIDE RISK SCREEN: Has anyone close to you tried to hurt or harm you recentlyo No Do you feel uncomfortable with anyone in your familyo No Has anyone forced you do things that you didnot want to doo No Do you have any thoughts of harming yourselfo No Patient displays signs or symptoms of abuse and/or neglect. No Electronic Signature(s) Signed: 10/26/2016 12:41:40 PM By: Regan Lemming BSN, RN Entered By: Regan Lemming on 10/26/2016 12:41:39 Bradley [name] ([id number]) -------------------------------------------------------------------------------- Activities of Daily Living Details Patient Name: [name], [name] Date of Service: 10/26/2016 12:45 PM Medical Record Number: [id number] Patient Account Number: [id number] Date of Birth/Sex: 03-Nov-1945 (71 y.o. Male) Treating RN: Baruch Gouty, RN, BSN, [place] Primary [facility] Imoni Kohen: [name] Other Clinician: Referring Gracilyn Gunia: [name] Treating Diallo Ponder/Extender: Ricard Dillon Weeks in Treatment: 0 Activities of Daily Living Items Answer Activities of Daily Living (Please select one for each item) Drive Automobile Not Able Take Medications Completely Able Use Telephone Completely Able Care for Appearance Need Assistance Use Toilet Need Assistance Bath / Shower Need Assistance Dress Self Need Assistance Feed Self Need Assistance Walk Need Assistance Get In / Out Bed Need Assistance Housework Need  Assistance Prepare Meals Need Assistance Handle Money Need Assistance Shop for Self Need Assistance Electronic Signature(s) Signed: 10/26/2016 12:42:09 PM By: Regan Lemming BSN, RN Entered By: Regan Lemming on 10/26/2016 12:42:08 Bradley [name] ([id number]) -------------------------------------------------------------------------------- Education Assessment Details Patient Name: Bradley Loa A. Date of Service: 10/26/2016 12:45 PM Medical Record Number: [id number] Patient Account Number: [id number] Date of Birth/Sex: July 26, 1945 (71 y.o. Male) Treating RN: Baruch Gouty, RN, BSN, [place] [facility] Braniya Farrugia: [name] Other Clinician: Referring Nova Schmuhl: [name] Treating Essie Lagunes/Extender: Tito Dine in Treatment: 0 Primary Learner Assessed: Patient Learning Preferences/Education Level/Primary Language Learning Preference: Explanation Highest Education Level: College or Above Preferred Language: English Cognitive Barrier Assessment/Beliefs Language Barrier: No Physical Barrier Assessment Impaired Vision: No Impaired Hearing: No Decreased Hand dexterity: No Knowledge/Comprehension Assessment Knowledge Level: High Comprehension Level: High Ability to understand written High instructions: Ability to understand verbal High instructions: Motivation Assessment Anxiety Level: Calm Cooperation: Cooperative Education Importance: Acknowledges Need Interest in Health Problems: Asks Questions Perception: Coherent Willingness to Engage in Self- High Management Activities: Readiness to Engage in Self- High Management Activities: Electronic Signature(s) Signed: 10/26/2016 12:42:46 PM By: Regan Lemming BSN, RN Entered By: Regan Lemming on 10/26/2016 12:42:45 Bradley [name] ([id number]) -------------------------------------------------------------------------------- Fall Risk Assessment Details Patient Name: Bradley [name]. Date of Service: 10/26/2016 12:45  PM Medical Record Number: [id number] Patient Account Number: [id number] Date of Birth/Sex: [birth year] (71 y.o. Male) Treating RN: Baruch Gouty, RN, BSN, [place] Primary [facility] Pierina Schuknecht: [name] Other Clinician: Referring Marai Teehan: [name] Treating Kayde Atkerson/Extender: Tito Dine in Treatment: 0 Fall Risk Assessment Items Have you had 2 or more falls in the last 12 monthso 0 No Have you had any fall that resulted in injury in the last 12 monthso 0 No FALL RISK ASSESSMENT: History of falling - immediate or within 3 months 0 No Secondary diagnosis 0 No Ambulatory aid None/bed rest/wheelchair/nurse 0 Yes Crutches/cane/walker 0 No Furniture 0 No  IV Access/Saline Lock 0 No Gait/Training Normal/bed rest/immobile 0 Yes Weak 10 Yes Impaired 20 Yes Mental Status Oriented to own ability 0 Yes Electronic Signature(s) Signed: 10/26/2016 12:43:09 PM By: Regan Lemming BSN, RN Entered By: Regan Lemming on 10/26/2016 12:43:08 Bradley [name] ([id number]) -------------------------------------------------------------------------------- Foot Assessment Details Patient Name: Bradley Loa A. Date of Service: 10/26/2016 12:45 PM Medical Record Number: [id number] Patient Account Number: [id number] Date of Birth/Sex: [birth year] (71 y.o. Male) Treating RN: Baruch Gouty, RN, BSN, [place] [facility] Albi Rappaport: [name] Other Clinician: Referring Esperanza Madrazo: [name] Treating Johntavius Shepard/Extender: Ricard Dillon Weeks in Treatment: 0 Foot Assessment Items Site Locations + = Sensation present, - = Sensation absent, C = Callus, U = Ulcer R = Redness, W = Warmth, M = Maceration, PU = Pre-ulcerative lesion F = Fissure, S = Swelling, D = Dryness Assessment Right: Left: Other Deformity: No No Prior Foot Ulcer: No No Prior Amputation: No No Charcot Joint: No No Ambulatory Status: Non-ambulatory Assistance Device: Wheelchair Gait: Administrator, arts) Signed:  10/26/2016 5:08:53 PM By: Regan Lemming BSN, RN Entered By: Regan Lemming on 10/26/2016 12:50:08 Bradley [name] ([id number]) -------------------------------------------------------------------------------- Nutrition Risk Assessment Details Patient Name: Bradley Loa A. Date of Service: 10/26/2016 12:45 PM Medical Record Number: [id number] Patient Account Number: [id number] Date of Birth/Sex: 1945-11-28 (71 y.o. Male) Treating RN: Baruch Gouty, RN, BSN, [place] [facility] Va Broadwell: [name] Other Clinician: Referring Onix Jumper: [name] Treating Amilee Janvier/Extender: Ricard Dillon Weeks in Treatment: 0 Height (in): 75 Weight (lbs): Body Mass Index (BMI): Nutrition Risk Assessment Items NUTRITION RISK SCREEN: I have an illness or condition that made me change the kind and/or 0 No amount of food I eat I eat fewer than two meals per day 3 Yes I eat few fruits and vegetables, or milk products 0 No I have three or more drinks of beer, liquor or wine almost every day 0 No I have tooth or mouth problems that make it hard for me to eat 0 No I don't always have enough money to buy the food I need 0 No I eat alone most of the time 0 No I take three or more different prescribed or over-the-counter drugs a 0 No day Without wanting to, I have lost or gained 10 pounds in the last six 2 Yes months I am not always physically able to shop, cook and/or feed myself 0 No Nutrition Protocols Good Risk Protocol Provide education on Moderate Risk Protocol 0 nutrition Electronic Signature(s) Signed: 10/26/2016 5:08:53 PM By: Regan Lemming BSN, RN Entered By: Regan Lemming on 10/26/2016 12:49:50

## 2016-10-28 LAB — SURGICAL PATHOLOGY

## 2016-10-28 NOTE — Progress Notes (Signed)
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 colostomy at one point. In any case he is only been using wet to dry dressings surprisingly changing this himself at home using a mirror. He does not have home health. He does have a level II pressure-relief surface as well as a Roho cushion for his wheelchair. In spite of this the wound is considerably larger one than when he was last in the clinic currently measuring 12.5 x 7. There is also an [place] ([phone]) area superiorly in the wound that tunnels more deeply. Clearly a stage III wound 03/15/16 patient presents today for reevaluation concerning his midline sacral pressure ulcer. This again is an extensive ulcer which does not extend to bone fortunately but is sufficiently large to make healing of this wound difficult. Again he has been seen at [facility] where apparently they did discuss with him the possibility of a diverting colostomy but he  did not want any part of that. Subsequently he has not followed up there currently. He continues overall to do fairly well all things considered with this wound. He is currently utilizing Medihoney Santyl would be extremely expensive for the amount he would need and likely cost prohibitive. 03/29/16; we'll follow this patient on an every two-week basis. He has a fairly substantial stage 3 pressure ulcer over his lower sacrum and coccyx and extending into his bilateral gluteal areas left greater than right. He now has home health[facility]. He is applying Medihoney, kerlix and border foam. He arrives today with the intake nurse reporting a large amount of drainage. The patient stated he put his dressing on it 7:00 this morning by the time he arrived here at 10 there was already a moderate to a large amount of drainage. I once again reviewed his history. He had an attempted closure with myocutaneous flap earlier this year at [facility]. This did not go well. He was offered a diverting colostomy but refused. He is not a candidate for a wound VAC as the actual wound is precariously close to his anal opening. As mentioned he does have [facility] but miraculously this patient who is a  paraplegic is actually changing the dressings himself. 04/12/16 patient presents today for a follow-up of his essentially large sacral pressure ulcer stage III. Nothing has changed dramatically since I last saw him about one month ago. He has seen Dr. Dellia Nims once the interim. With that being said patient's wound appears somewhat less macerated today compared to previous evaluations. He still has no pain being a quadriplegic. 04-26-16 Mr. Michalowski returns today for a violation of his stage III sacral pressure ulcer he denies any complaints concerns or issues over the past 2 weeks. He missed to changing dressing twice daily due to drainage although he states this is not an increase in drainage over the past 2  weeks. He does change his dressings independently. He admits to sitting in his motorized chair for no more than 2-3 hours at which time he transfers to bed and rotates lateral position. 05/10/16; Cirilo Canner returns today for review of his stage III sacral pressure ulcer. He denies any concerns over the last 2 weeks although he seems to be running out of Aquacel Ag and on those days he uses Medihoney. He has [facility] was supplying his dressings. He still complains of drainage. He does his dressings independently. He has in his motorized chair for 2-3 hours that time other than that he offloads this. Dimensions of the wound are down 1 cm in both directions. He underwent an aggressive debridement on his last visit of thick circumferential skin and subcutaneous tissue. It is possible at some point in the future he is going to need this done again 05/24/16; the patient returns today for review of his stage III sacral pressure ulcer. We have been using Aquacel Ag he tells me that he changes this up to twice a day. I'm not really certain of the reason for this frequency of changing. He has some involvement from the home health nurses but I think is doing most of the changing himself which I think because of his paraplegia would be a very difficult exercise. Nevertheless he states that there is "wetness". I am not sure if there is another dressing that we could easily changed that much. I'd wanted to change to [facility] but I'll need to have a sense of how frequent he would need to change this. 06/14/16; this is a patient returns for review of his stage III sacral pressure ulcer. We have been using Aquacel Ag and over the last 2 visits he has had extensive debridement so of the thick circumferential skin and subcutaneous tissue that surrounds this wound. In spite of this really absolutely no change in the condition of the wound warrants measurements. We have [facility] home health I believe  changing the dressing on 3 occasions the patient states he does this on one occasion himself 06/28/16; this is a patient who has a fairly large stage III sacral pressure ulcer. I changed him to [facility] from Aquacel 2 weeks ago. He returns today in follow-up. In the meantime a nurse from [facility] has calledrequesting ordering of a wound VAC. He had this discussion before. The problem is the proximity of the lowest edge of this wound to the patient's anal opening roughly 3/4 of an inch. Can't see BARBARA, KENG ([id number]) how this can be arranged. Apparently the nurse who is calling has a lot of experience, the question would be then when she is not available would be doing this. I would not have thought that this wound is not amenable to a  wound VAC because of this reason 07/12/16; the patient comes in today and I have signed orders for a wound VAC. The home health team through [facility] is convinced that he can benefit from this even though there is close proximity to his anal opening beneath the gluteal clefts. The patient does not have a bowel regimen but states he has a bowel movement every 2 days this will also provide some problem with regards to the vac seal 07/26/16; the patient never did obtain a [place] wound VAC as he could not afford the $200 per month co- pay we have been using Hydrofera Blue now for 6 weeks or so. No major change in this wound at all. He is still not interested in the concept of plastic surgery. There changing the dressing every second day 08/09/16; the patient arrives with a wound precisely in the same situation. In keeping with the plan I outlined last time extensive debridement with an open curet the surface of this is not completely viable. Still has some degree of surrounding thick skin and subcutaneous tissue. No evidence of infection. Once again I have had a conversation with him about plastic surgery, he is simply not interested. 08/23/16; wound  is really no different. Thick circumferential skin and subcutaneous tissue around the wound edge which is a lot better from debridement we did earlier in the year. The surface of the wound looks viable however with a curet there is definitely a gritty surface to this. We use Medihoney for a while, he could not afford Santyl. I don't think we could get a supply of Iodoflex. He talks a little more positively about the concept of plastic surgery which I've gone over with him today 08/31/16;; patient arrives in clinic today with the wound surface really no different there is no changes in dimensions. I debrided today surface on the left upper side of this wound aggressively week ago there is no real change here no evidence of epithelialization. The problem with debridement in the clinic is that he believes from this very liberally. We have been using Sorbact. 09/21/16; absolutely no change in the appearance or measurements of this wound. More recently I've been debrided in this aggressively and using sorbact to see if we could get to a better wound surface. Although this visually looks satisfactory, debridement reveals a very gritty surface to this. However even with this debridement and removal of thick nonviable skin and subcutaneous tissue from around the large amount of the circumference of this wound we have made absolutely no progress. This may be an offloading issue I'm just not completely certain. It has 2 close proximity in its inferior aspect to consider negative pressure therapy 10/26/16; READMISSION This patient called our clinic yesterday to report an odor in his wound. He had been to see plastic surgery at [facility] at our request after his last visit on 09/21/16; we have been seeing him for several months with a large stage III wound. He had been sent to general surgery for consideration of a colostomy, that appointment was not until mid June He comes in today with a temperature of 101. He is  reporting an odor in the wound since last weekend. Wound History Patient presents with 1 open wound that has been present for approximately months. Patient has been treating wound in the following manner: wet to dry. The wound has been healed in the past but has re- opened. Laboratory tests have not been performed in the last month. Patient reportedly has  not tested positive for an antibiotic resistant organism. Patient reportedly has not had testing performed to evaluate circulation in the legs. Patient experiences the following problems associated with their wounds: infection. Patient History Information obtained from Patient. Allergies Bradley [name], Bradley [name] ([id number]) No Known Allergies Family History Cancer - Mother, Heart Disease - Mother, Seizures - Child, No family history of Diabetes, Hereditary Spherocytosis, Hypertension, Kidney Disease, Lung Disease, Stroke, Thyroid Problems, Tuberculosis. Social History Former smoker, Marital Status - Single, Alcohol Use - Never, Drug Use - No History, Caffeine Use - Moderate - coffee. Medical History Eyes Denies history of Cataracts, Glaucoma, Optic Neuritis Hematologic/Lymphatic Patient has history of Anemia Denies history of Hemophilia, Human Immunodeficiency Virus, Lymphedema, Sickle Cell Disease Respiratory Denies history of Aspiration, Asthma, Chronic Obstructive Pulmonary Disease (COPD), Pneumothorax, Sleep Apnea, Tuberculosis Cardiovascular Patient has history of Hypertension Gastrointestinal Denies history of Cirrhosis , Colitis, Crohn s, Hepatitis A, Hepatitis B, Hepatitis C Endocrine Denies history of Type I Diabetes, Type II Diabetes Genitourinary Denies history of End Stage Renal Disease Immunological Denies history of Lupus Erythematosus, Raynaud s, Scleroderma Integumentary (Skin) Patient has history of History of pressure wounds Musculoskeletal Patient has history of Rheumatoid Arthritis Neurologic Patient has  history of Paraplegia Oncologic Denies history of Received Chemotherapy, Received Radiation Psychiatric Denies history of Anorexia/bulimia, Confinement Anxiety Hospitalization/Surgery History - 06/29/2013, [facility], Hemorroids. Medical And Surgical History Notes Genitourinary frequent urination Review of Systems (ROS) Constitutional Symptoms (General Health) The patient has no complaints or symptoms. Bradley [name], Bradley [name] ([id number]) Eyes The patient has no complaints or symptoms. Ear/Nose/Mouth/Throat The patient has no complaints or symptoms. Hematologic/Lymphatic The patient has no complaints or symptoms. Respiratory The patient has no complaints or symptoms. Cardiovascular The patient has no complaints or symptoms. Gastrointestinal The patient has no complaints or symptoms. Endocrine The patient has no complaints or symptoms. Genitourinary The patient has no complaints or symptoms. Immunological The patient has no complaints or symptoms. Integumentary (Skin) Complains or has symptoms of Wounds, Breakdown. Musculoskeletal The patient has no complaints or symptoms. Neurologic The patient has no complaints or symptoms. Oncologic The patient has no complaints or symptoms. Psychiatric The patient has no complaints or symptoms. Objective Constitutional Sitting or standing Blood Pressure is within target range for patient.. Pulse regular and within target range for patient.Marland Kitchen Respirations regular, non-labored and within target range.. Temperature is normal and within the target range for the patient.Marland Kitchen appears in no distress. Vitals Time Taken: 12:48 PM, Height: 75 in, Source: Stated, Temperature: 101.4 F, Pulse: 105 bpm, Respiratory Rate: 19 breaths/min, Blood Pressure: 123/60 mmHg. Respiratory Respiratory effort is easy and symmetric bilaterally. Rate is normal at rest and on room air.. Cardiovascular Heart rhythm and rate regular, without murmur or gallop.[name]  ([id number]) Gastrointestinal (GI) Abdomen is soft and non-distended without masses or tenderness. Bowel sounds active in all quadrants.. No liver or spleen enlargement or tenderness.. General Notes: Wound exam; there is a marked deterioration in the superior part of this wound. Deep probing divot going down to the sacral bone. Large amount of necrotic muscle. Using a scalpel and pickups this area was debrided specimen of the underlying bone obtained with Rongers for pathology and C+S. However there is considerable amount of damage necrotic tissue odor and purulence Integumentary (Hair, Skin) Wound #6 status is Open. Original cause of wound was Pressure Injury. The wound is located on the Sacrum. The wound measures 15cm length x 14cm width x 4.5cm depth; 164.934cm^2 area and 742.201cm^3 volume. There is bone, muscle, and  Fat Layer (Subcutaneous Tissue) Exposed exposed. There is no tunneling or undermining noted. There is a large amount of purulent drainage noted. The wound margin is flat and intact. There is small (1-33%) pink, pale granulation within the wound bed. There is a large (67-100%) amount of necrotic tissue within the wound bed including Eschar and Necrosis of Muscle. The periwound skin appearance did not exhibit: Callus, Crepitus, Excoriation, Induration, Rash, Scarring, Dry/Scaly, Maceration, Atrophie Blanche, Cyanosis, Ecchymosis, Hemosiderin Staining, Mottled, Pallor, Rubor, Erythema. Periwound temperature was noted as No Abnormality. Assessment Active Problems ICD-10 L89.154 - Pressure ulcer of sacral region, stage 4 M46.28 - Osteomyelitis of vertebra, sacral and sacrococcygeal region Procedures Wound #6 Pre-procedure diagnosis of Wound #6 is a Pressure Ulcer located on the Sacrum . There was a Skin/Subcutaneous Tissue/Muscle Debridement ([id number]) debridement with total area of 16 sq cm performed by Ricard Dillon, MD. with the following instrument(s): Blade and  Forceps to remove Viable and Non-Viable tissue/material including Fibrin/Slough, Muscle, Eschar, and Subcutaneous after achieving pain control using Lidocaine 4% Topical Solution. A time out was conducted at 13:23, prior to the start of the procedure. A Large amount of bleeding was controlled with Pressure. The procedure was tolerated well with a pain level of 0 throughout and a pain level of 0 following the procedure. Post Debridement Measurements: 15cm length x 14cm width x 4.5cm depth; 742.201cm^3 volume. Post debridement Stage noted as Category/Stage IV. BENTLEY, FISSEL ([id number]) Character of Wound/Ulcer Post Debridement requires further debridement. Post procedure Diagnosis Wound #6: Same as Pre-Procedure Pre-procedure diagnosis of Wound #6 is a Pressure Ulcer located on the Sacrum . There was a biopsy performed by Ricard Dillon, MD. There was a biopsy performed on Wound Bed. The skin was cleansed and prepped with anti-septic followed by pain control using Lidocaine 4% Topical Solution. Tissue was removed at its base with the following instrument(s): Rongeur and sent to pathology. A Large amount of bleeding was controlled with Pressure. A time out was conducted at 13:20, prior to the start of the procedure. The procedure was tolerated well with a pain level of 0 throughout and a pain level of 0 following the procedure. Post procedure Diagnosis Wound #6: Same as Pre-Procedure Plan Wound Cleansing: Wound #6 Sacrum: Clean wound with Normal Saline. Anesthetic: Wound #6 Sacrum: Topical Lidocaine 4% cream applied to wound bed prior to debridement Primary Wound Dressing: Wound #6 Sacrum: Aquacel Ag Secondary Dressing: Wound #6 Sacrum: ABD pad Dry Gauze Dressing Change Frequency: Wound #6 Sacrum: Change dressing every other day. Follow-up Appointments: Wound #6 Sacrum: Return Appointment in: - when discharged from ED Off-Loading: Wound #6 Sacrum: Gel wheelchair  cushion Roho cushion for wheelchair Turn and reposition every 2 hours Additional Orders / Instructions: Wound #6 Sacrum: Increase protein intake. Other: - PATIENT TO GO TO ER FOR EVALUATION AND TREATMENT....IV ABT, SURGICAL DEBRIDEMENT, MRI Home Health: Bradley [name], Bradley [name] ([id number]) Wound #6 Sacrum: [place] Nurse may visit PRN to address patient s wound care needs. FACE TO FACE ENCOUNTER: MEDICARE and MEDICAID PATIENTS: I certify that this patient is under my care and that I had a face-to-face encounter that meets the physician face-to-face encounter requirements with this patient on this date. The encounter with the patient was in whole or in part for the following MEDICAL CONDITION: (primary reason for [place]) MEDICAL NECESSITY: I certify, that based on my findings, NURSING services are a medically necessary home health service. HOME BOUND STATUS: I  certify that my clinical findings support that this patient is homebound (i.e., Due to illness or injury, pt requires aid of supportive devices such as crutches, cane, wheelchairs, walkers, the use of special transportation or the assistance of another person to leave their place of residence. There is a normal inability to leave the home and doing so requires considerable and taxing effort. Other absences are for medical reasons / religious services and are infrequent or of short duration when for other reasons). If current dressing causes regression in wound condition, may D/C ordered dressing product/s and apply Normal Saline Moist Dressing daily until next [place] / Other MD appointment. [place] of regression in wound condition at [phone]. Please direct any NON-WOUND related issues/requests for orders to patient's Primary Care Physician Medications-please add to medication list.: Wound #6 Sacrum: Other: - WILL NEED IV  ANTIBIOTICS..... Laboratory ordered were: Wound culture routine - sacral bone, Tiss Path Bx report - sacral bone Radiology ordered were: Magnetic Resonance Imaging (MRI) - sacral region #1 after debridement of muscle and a specimen obtained with bone. There is purulence here and necrosis that is clearly going to need a trip to the OR. Furthermore he has a temperature of 101 and I think it is quite likely he has underlying osteomyelitis plus or minus soft tissue infection. We advised a trip the ER for admission to hospital for among other things, surgical debridement, IV antibiotics, MRI and colostomy. Clearly with something like this he is not a candidate currently for flap closure until the infection is treated and the underlying tissue repairs if indeed this will be possible #2 I think he will be going to the ER at [facility] where he saw Dr. Wilfred Lacy of plastic surgery. He was also known there before he ever came to our clinic Electronic Signature(s) Signed: 10/26/2016 5:34:29 PM By: Linton Ham MD Entered By: Linton Ham on 10/26/2016 14:42:41 [name] ([id number]) -------------------------------------------------------------------------------- ROS/PFSH Details Patient Name: Bradley Loa A. Date of Service: 10/26/2016 12:45 PM Medical Record Number: [id number] Patient Account Number: [id number] Date of Birth/Sex: 04-02-1946 (71 y.o. Male) Treating RN: Baruch Gouty, RN, BSN, [place] Primary Care Provider: [name] Other Clinician: Referring Provider: [name] Treating Provider/Extender: Tito Dine in Treatment: 0 Label Progress Note Print Version as History and Physical for this encounter Information Obtained From Patient Wound History Do you currently have one or more open woundso Yes How many open wounds do you currently haveo 1 Approximately how long have you had your woundso months How have you been treating your wound(s) until nowo wet to  dry Has your wound(s) ever healed and then re-openedo Yes Have you had any lab work done in the past montho No Have you tested positive for an antibiotic resistant organism (MRSA, VRE)o No Have you had any tests for circulation on your legso No Have you had other problems associated with your woundso Infection Integumentary (Skin) Complaints and Symptoms: Positive for: Wounds; Breakdown Medical History: Positive for: History of pressure wounds Constitutional Symptoms (General Health) Complaints and Symptoms: No Complaints or Symptoms Eyes Complaints and Symptoms: No Complaints or Symptoms Medical History: Negative for: Cataracts; Glaucoma; Optic Neuritis Ear/Nose/Mouth/Throat Complaints and Symptoms: No Complaints or Symptoms Hematologic/Lymphatic Bradley [name], Bradley [name] ([id number]) Complaints and Symptoms: No Complaints or Symptoms Medical History: Positive for: Anemia Negative for: Hemophilia; Human Immunodeficiency Virus; Lymphedema; Sickle Cell Disease Respiratory Complaints and Symptoms: No Complaints or Symptoms Medical History: Negative for: Aspiration; Asthma; Chronic Obstructive Pulmonary Disease (COPD);  Pneumothorax; Sleep Apnea; Tuberculosis Cardiovascular Complaints and Symptoms: No Complaints or Symptoms Medical History: Positive for: Hypertension Gastrointestinal Complaints and Symptoms: No Complaints or Symptoms Medical History: Negative for: Cirrhosis ; Colitis; Crohnos; Hepatitis A; Hepatitis B; Hepatitis C Endocrine Complaints and Symptoms: No Complaints or Symptoms Medical History: Negative for: Type I Diabetes; Type II Diabetes Genitourinary Complaints and Symptoms: No Complaints or Symptoms Medical History: Negative for: End Stage Renal Disease Past Medical History Notes: frequent urination Immunological Bradley [name], Bradley A. ([id number]) Complaints and Symptoms: No Complaints or Symptoms Medical History: Negative for: Lupus Erythematosus;  Raynaudos; Scleroderma Musculoskeletal Complaints and Symptoms: No Complaints or Symptoms Medical History: Positive for: Rheumatoid Arthritis Neurologic Complaints and Symptoms: No Complaints or Symptoms Medical History: Positive for: Paraplegia Oncologic Complaints and Symptoms: No Complaints or Symptoms Medical History: Negative for: Received Chemotherapy; Received Radiation Psychiatric Complaints and Symptoms: No Complaints or Symptoms Medical History: Negative for: Anorexia/bulimia; Confinement Anxiety Immunizations Pneumococcal Vaccine: Received Pneumococcal Vaccination: Yes Immunization Notes: up to date Hospitalization / Surgery History Name of Hospital Purpose of Hospitalization/Surgery Date [facility] 06/29/2013 Family and Social History Cancer: Yes - Mother; Diabetes: No; Heart Disease: Yes - Mother; Hereditary Spherocytosis: No; Hypertension: No; Kidney Disease: No; Lung Disease: No; Seizures: Yes - Child; Stroke: No; Thyroid Bradley [name], Bradley A. ([id number]) Problems: No; Tuberculosis: No; Former smoker; Marital Status - Single; Alcohol Use: Never; Drug Use: No History; Caffeine Use: Moderate - coffee; Financial Concerns: No; Food, Clothing or Shelter Needs: No; Support System Lacking: No; Transportation Concerns: No; Advanced Directives: No; Patient does not want information on Advanced Directives; Do not resuscitate: No; Living Will: No; Medical Power of Attorney: No Electronic Signature(s) Signed: 10/26/2016 5:08:53 PM By: Regan Lemming BSN, RN Signed: 10/26/2016 5:34:29 PM By: Linton Ham MD Entered By: Regan Lemming on 10/26/2016 12:53:31 [name] ([id number]) -------------------------------------------------------------------------------- SuperBill Details Patient Name: Bradley Loa A. Date of Service: 10/26/2016 Medical Record Number: [id number] Patient Account Number: [id number] Date of Birth/Sex: 1946-01-31 (71 y.o. 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 of vertebra, sacral and sacrococcygeal region Facility Procedures CPT4 [name]: [id number] Description: 99213 - WOUND CARE VISIT-LEV 3 EST PT Modifier: Quantity: 1 CPT4 [name]: [id number] Description: 08811 - DEB MUSC/FASCIA 20 SQ CM/< ICD-10 Description Diagnosis L89.154 Pressure ulcer of sacral region, stage 4 M46.28 Osteomyelitis of vertebra, sacral and sacrococcy Modifier: geal region Quantity: 1 Physician Procedures CPT4 [name]: [id number] Description: 11043 - WC PHYS DEBR MUSCLE/FASCIA 20 SQ CM ICD-10 Description Diagnosis L89.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

## 2016-11-03 LAB — AEROBIC/ANAEROBIC CULTURE W GRAM STAIN (SURGICAL/DEEP WOUND)

## 2016-11-03 LAB — AEROBIC/ANAEROBIC CULTURE (SURGICAL/DEEP WOUND)

## 2016-11-04 DIAGNOSIS — S31809S Unspecified open wound of unspecified buttock, sequela: Secondary | ICD-10-CM | POA: Diagnosis not present

## 2016-11-04 DIAGNOSIS — N400 Enlarged prostate without lower urinary tract symptoms: Secondary | ICD-10-CM | POA: Diagnosis not present

## 2016-11-04 DIAGNOSIS — A419 Sepsis, unspecified organism: Secondary | ICD-10-CM | POA: Diagnosis not present

## 2016-11-04 DIAGNOSIS — D649 Anemia, unspecified: Secondary | ICD-10-CM | POA: Diagnosis not present

## 2016-11-04 DIAGNOSIS — M6281 Muscle weakness (generalized): Secondary | ICD-10-CM | POA: Diagnosis not present

## 2016-11-04 DIAGNOSIS — L89159 Pressure ulcer of sacral region, unspecified stage: Secondary | ICD-10-CM | POA: Diagnosis not present

## 2016-11-04 DIAGNOSIS — R279 Unspecified lack of coordination: Secondary | ICD-10-CM | POA: Diagnosis not present

## 2016-11-04 DIAGNOSIS — R159 Full incontinence of feces: Secondary | ICD-10-CM | POA: Diagnosis not present

## 2016-11-04 DIAGNOSIS — N319 Neuromuscular dysfunction of bladder, unspecified: Secondary | ICD-10-CM | POA: Diagnosis not present

## 2016-11-04 DIAGNOSIS — I10 Essential (primary) hypertension: Secondary | ICD-10-CM | POA: Diagnosis not present

## 2016-11-04 DIAGNOSIS — G8220 Paraplegia, unspecified: Secondary | ICD-10-CM | POA: Diagnosis not present

## 2016-11-04 DIAGNOSIS — A499 Bacterial infection, unspecified: Secondary | ICD-10-CM | POA: Diagnosis not present

## 2016-11-04 DIAGNOSIS — R5381 Other malaise: Secondary | ICD-10-CM | POA: Diagnosis not present

## 2016-11-04 DIAGNOSIS — Z87891 Personal history of nicotine dependence: Secondary | ICD-10-CM | POA: Diagnosis not present

## 2016-11-04 DIAGNOSIS — L089 Local infection of the skin and subcutaneous tissue, unspecified: Secondary | ICD-10-CM | POA: Diagnosis not present

## 2016-11-04 DIAGNOSIS — L89154 Pressure ulcer of sacral region, stage 4: Secondary | ICD-10-CM | POA: Diagnosis not present

## 2016-11-04 DIAGNOSIS — Z433 Encounter for attention to colostomy: Secondary | ICD-10-CM | POA: Diagnosis not present

## 2016-11-04 DIAGNOSIS — M4628 Osteomyelitis of vertebra, sacral and sacrococcygeal region: Secondary | ICD-10-CM | POA: Diagnosis not present

## 2016-11-04 DIAGNOSIS — M869 Osteomyelitis, unspecified: Secondary | ICD-10-CM | POA: Diagnosis not present

## 2016-11-04 DIAGNOSIS — D508 Other iron deficiency anemias: Secondary | ICD-10-CM | POA: Diagnosis not present

## 2016-11-04 DIAGNOSIS — Z741 Need for assistance with personal care: Secondary | ICD-10-CM | POA: Diagnosis not present

## 2016-11-04 DIAGNOSIS — Z79899 Other long term (current) drug therapy: Secondary | ICD-10-CM | POA: Diagnosis not present

## 2016-11-07 DIAGNOSIS — M4628 Osteomyelitis of vertebra, sacral and sacrococcygeal region: Secondary | ICD-10-CM | POA: Diagnosis not present

## 2016-11-07 DIAGNOSIS — L89159 Pressure ulcer of sacral region, unspecified stage: Secondary | ICD-10-CM | POA: Diagnosis not present

## 2016-11-07 DIAGNOSIS — G8220 Paraplegia, unspecified: Secondary | ICD-10-CM | POA: Diagnosis not present

## 2016-11-09 DIAGNOSIS — D649 Anemia, unspecified: Secondary | ICD-10-CM | POA: Diagnosis not present

## 2016-11-09 DIAGNOSIS — M869 Osteomyelitis, unspecified: Secondary | ICD-10-CM | POA: Diagnosis not present

## 2016-11-09 DIAGNOSIS — I10 Essential (primary) hypertension: Secondary | ICD-10-CM | POA: Diagnosis not present

## 2016-11-09 DIAGNOSIS — G8220 Paraplegia, unspecified: Secondary | ICD-10-CM | POA: Diagnosis not present

## 2016-11-17 DIAGNOSIS — Z433 Encounter for attention to colostomy: Secondary | ICD-10-CM | POA: Diagnosis not present

## 2016-11-17 DIAGNOSIS — G8220 Paraplegia, unspecified: Secondary | ICD-10-CM | POA: Diagnosis not present

## 2016-11-17 DIAGNOSIS — M4628 Osteomyelitis of vertebra, sacral and sacrococcygeal region: Secondary | ICD-10-CM | POA: Diagnosis not present

## 2016-11-17 DIAGNOSIS — Z79899 Other long term (current) drug therapy: Secondary | ICD-10-CM | POA: Diagnosis not present

## 2016-11-17 DIAGNOSIS — N400 Enlarged prostate without lower urinary tract symptoms: Secondary | ICD-10-CM | POA: Diagnosis not present

## 2016-11-17 DIAGNOSIS — R159 Full incontinence of feces: Secondary | ICD-10-CM | POA: Diagnosis not present

## 2016-11-17 DIAGNOSIS — I10 Essential (primary) hypertension: Secondary | ICD-10-CM | POA: Diagnosis not present

## 2016-11-17 DIAGNOSIS — S31809S Unspecified open wound of unspecified buttock, sequela: Secondary | ICD-10-CM | POA: Diagnosis not present

## 2016-11-17 DIAGNOSIS — L89159 Pressure ulcer of sacral region, unspecified stage: Secondary | ICD-10-CM | POA: Diagnosis not present

## 2016-11-29 DIAGNOSIS — Z933 Colostomy status: Secondary | ICD-10-CM | POA: Diagnosis not present

## 2016-11-29 DIAGNOSIS — Z87891 Personal history of nicotine dependence: Secondary | ICD-10-CM | POA: Diagnosis not present

## 2016-11-29 DIAGNOSIS — E785 Hyperlipidemia, unspecified: Secondary | ICD-10-CM | POA: Diagnosis not present

## 2016-11-29 DIAGNOSIS — M6281 Muscle weakness (generalized): Secondary | ICD-10-CM | POA: Diagnosis not present

## 2016-11-29 DIAGNOSIS — R279 Unspecified lack of coordination: Secondary | ICD-10-CM | POA: Diagnosis not present

## 2016-11-29 DIAGNOSIS — D638 Anemia in other chronic diseases classified elsewhere: Secondary | ICD-10-CM | POA: Diagnosis not present

## 2016-11-29 DIAGNOSIS — N401 Enlarged prostate with lower urinary tract symptoms: Secondary | ICD-10-CM | POA: Diagnosis not present

## 2016-11-29 DIAGNOSIS — R159 Full incontinence of feces: Secondary | ICD-10-CM | POA: Diagnosis not present

## 2016-11-29 DIAGNOSIS — L89154 Pressure ulcer of sacral region, stage 4: Secondary | ICD-10-CM | POA: Diagnosis not present

## 2016-11-29 DIAGNOSIS — R5381 Other malaise: Secondary | ICD-10-CM | POA: Diagnosis not present

## 2016-11-29 DIAGNOSIS — Z79899 Other long term (current) drug therapy: Secondary | ICD-10-CM | POA: Diagnosis not present

## 2016-11-29 DIAGNOSIS — M869 Osteomyelitis, unspecified: Secondary | ICD-10-CM | POA: Diagnosis not present

## 2016-11-29 DIAGNOSIS — I10 Essential (primary) hypertension: Secondary | ICD-10-CM | POA: Diagnosis not present

## 2016-11-29 DIAGNOSIS — N319 Neuromuscular dysfunction of bladder, unspecified: Secondary | ICD-10-CM | POA: Diagnosis not present

## 2016-11-29 DIAGNOSIS — M4628 Osteomyelitis of vertebra, sacral and sacrococcygeal region: Secondary | ICD-10-CM | POA: Diagnosis not present

## 2016-11-29 DIAGNOSIS — G8220 Paraplegia, unspecified: Secondary | ICD-10-CM | POA: Diagnosis not present

## 2016-12-06 ENCOUNTER — Other Ambulatory Visit: Payer: Self-pay

## 2016-12-06 DIAGNOSIS — Z933 Colostomy status: Secondary | ICD-10-CM | POA: Diagnosis not present

## 2016-12-06 DIAGNOSIS — G8220 Paraplegia, unspecified: Secondary | ICD-10-CM | POA: Diagnosis not present

## 2016-12-06 DIAGNOSIS — M869 Osteomyelitis, unspecified: Secondary | ICD-10-CM | POA: Diagnosis not present

## 2016-12-06 DIAGNOSIS — M4628 Osteomyelitis of vertebra, sacral and sacrococcygeal region: Secondary | ICD-10-CM | POA: Diagnosis not present

## 2016-12-06 DIAGNOSIS — Z79899 Other long term (current) drug therapy: Secondary | ICD-10-CM | POA: Diagnosis not present

## 2016-12-06 DIAGNOSIS — M6281 Muscle weakness (generalized): Secondary | ICD-10-CM | POA: Diagnosis not present

## 2016-12-06 DIAGNOSIS — N319 Neuromuscular dysfunction of bladder, unspecified: Secondary | ICD-10-CM | POA: Diagnosis not present

## 2016-12-06 DIAGNOSIS — R5381 Other malaise: Secondary | ICD-10-CM | POA: Diagnosis not present

## 2016-12-06 DIAGNOSIS — Z87891 Personal history of nicotine dependence: Secondary | ICD-10-CM | POA: Diagnosis not present

## 2016-12-06 DIAGNOSIS — L089 Local infection of the skin and subcutaneous tissue, unspecified: Secondary | ICD-10-CM | POA: Diagnosis not present

## 2016-12-06 DIAGNOSIS — N401 Enlarged prostate with lower urinary tract symptoms: Secondary | ICD-10-CM | POA: Diagnosis not present

## 2016-12-06 DIAGNOSIS — L89154 Pressure ulcer of sacral region, stage 4: Secondary | ICD-10-CM | POA: Diagnosis not present

## 2016-12-06 DIAGNOSIS — I10 Essential (primary) hypertension: Secondary | ICD-10-CM | POA: Diagnosis not present

## 2016-12-06 DIAGNOSIS — Z9889 Other specified postprocedural states: Secondary | ICD-10-CM | POA: Diagnosis not present

## 2016-12-06 DIAGNOSIS — R279 Unspecified lack of coordination: Secondary | ICD-10-CM | POA: Diagnosis not present

## 2016-12-06 DIAGNOSIS — D638 Anemia in other chronic diseases classified elsewhere: Secondary | ICD-10-CM | POA: Diagnosis not present

## 2016-12-06 DIAGNOSIS — Z5181 Encounter for therapeutic drug level monitoring: Secondary | ICD-10-CM | POA: Diagnosis not present

## 2016-12-06 DIAGNOSIS — A419 Sepsis, unspecified organism: Secondary | ICD-10-CM | POA: Diagnosis not present

## 2016-12-06 DIAGNOSIS — R159 Full incontinence of feces: Secondary | ICD-10-CM | POA: Diagnosis not present

## 2016-12-06 DIAGNOSIS — E785 Hyperlipidemia, unspecified: Secondary | ICD-10-CM | POA: Diagnosis not present

## 2016-12-06 NOTE — Patient Outreach (Signed)
[place] [facility]) Care Management  12/06/2016  Bradley [name] 05-Nov-1945 [id number]     Transition of Care Referral  Referral Date: 12/06/16 Referral Source: [employer] Discharge Report Date of Admission: 11/29/16 Diagnosis: fecal incontinence Date of Discharge: 12/06/16 Facility: [place] Medicare    Outreach attempt # 1 to patient. No answer. RN CM left HIPAA compliant message along with contact info.      Plan: RN CM will make outreach attempt to patient within three business days if no return call from patient.   Enzo Montgomery, RN,BSN,CCM [place] Management Telephonic Care Management Coordinator Direct Phone: [phone] Toll Free: [phone] Fax: [phone]

## 2016-12-07 ENCOUNTER — Other Ambulatory Visit
Admission: RE | Admit: 2016-12-07 | Discharge: 2016-12-07 | Disposition: A | Discharge status: Home / Self Care | Payer: Medicare HMO | Source: Skilled Nursing Facility | Attending: Family Medicine | Admitting: Family Medicine

## 2016-12-07 DIAGNOSIS — Z5181 Encounter for therapeutic drug level monitoring: Secondary | ICD-10-CM | POA: Insufficient documentation

## 2016-12-08 ENCOUNTER — Other Ambulatory Visit
Admission: RE | Admit: 2016-12-08 | Discharge: 2016-12-08 | Disposition: A | Discharge status: Home / Self Care | Payer: Medicare HMO | Source: Ambulatory Visit | Attending: Family Medicine | Admitting: Family Medicine

## 2016-12-08 ENCOUNTER — Other Ambulatory Visit: Payer: Self-pay

## 2016-12-08 DIAGNOSIS — Z5181 Encounter for therapeutic drug level monitoring: Secondary | ICD-10-CM | POA: Insufficient documentation

## 2016-12-08 LAB — VANCOMYCIN, TROUGH: Vancomycin Tr: 7 ug/mL — ABNORMAL LOW (ref 15–20)

## 2016-12-08 NOTE — Patient Outreach (Signed)
[facility]) Care Management  12/08/2016  TOA MIA 1946/02/24 [id number]   Transition of Care Referral  Referral Date: 12/06/16 Referral Source: [facility] Discharge Report Date of Admission: 11/29/16 Diagnosis: fecal incontinence Date of Discharge: 12/06/16 Facility: [place] Medicare   Outreach attempt #2 to patient. No answer at present.        Plan: RN CM will make outreach attempt to patient within one business day if no return call from patient.   Enzo Montgomery, RN,BSN,CCM [place] Management Telephonic Care Management Coordinator Direct Phone: [phone] Toll Free: [phone] Fax: [phone]

## 2016-12-09 ENCOUNTER — Other Ambulatory Visit: Payer: Self-pay

## 2016-12-09 NOTE — Patient Outreach (Signed)
[place] [facility]) Care Management  12/09/2016  JENNER ROSIER 12/26/1945 [id number]    Transition of Care Referral  Referral Date: 12/06/16 Referral Source: [employer] Discharge Report Date of Admission: 11/29/16 Diagnosis: fecal incontinence Date of Discharge: 12/06/16 Facility: [place] Medicare   Outreach attempt #3 to patient. Line busy after several attempts.    Plan: RN CM will send unsuccessful outreach letter to patient and close case if no response within 10 business days.   Enzo Montgomery, RN,BSN,CCM [place] Management Telephonic Care Management Coordinator Direct Phone: [phone] Toll Free: [phone] Fax: [phone]

## 2016-12-11 DIAGNOSIS — M869 Osteomyelitis, unspecified: Secondary | ICD-10-CM | POA: Diagnosis not present

## 2016-12-11 DIAGNOSIS — I10 Essential (primary) hypertension: Secondary | ICD-10-CM | POA: Diagnosis not present

## 2016-12-11 DIAGNOSIS — G8220 Paraplegia, unspecified: Secondary | ICD-10-CM | POA: Diagnosis not present

## 2016-12-15 DIAGNOSIS — L89154 Pressure ulcer of sacral region, stage 4: Secondary | ICD-10-CM | POA: Diagnosis not present

## 2016-12-15 DIAGNOSIS — Z9889 Other specified postprocedural states: Secondary | ICD-10-CM | POA: Diagnosis not present

## 2016-12-15 DIAGNOSIS — G8220 Paraplegia, unspecified: Secondary | ICD-10-CM | POA: Diagnosis not present

## 2016-12-21 DIAGNOSIS — K592 Neurogenic bowel, not elsewhere classified: Secondary | ICD-10-CM | POA: Diagnosis not present

## 2016-12-21 DIAGNOSIS — D508 Other iron deficiency anemias: Secondary | ICD-10-CM | POA: Diagnosis not present

## 2016-12-21 DIAGNOSIS — L89154 Pressure ulcer of sacral region, stage 4: Secondary | ICD-10-CM | POA: Diagnosis not present

## 2016-12-21 DIAGNOSIS — I10 Essential (primary) hypertension: Secondary | ICD-10-CM | POA: Diagnosis not present

## 2016-12-21 DIAGNOSIS — G8220 Paraplegia, unspecified: Secondary | ICD-10-CM | POA: Diagnosis not present

## 2016-12-21 DIAGNOSIS — Z433 Encounter for attention to colostomy: Secondary | ICD-10-CM | POA: Diagnosis not present

## 2016-12-21 DIAGNOSIS — N319 Neuromuscular dysfunction of bladder, unspecified: Secondary | ICD-10-CM | POA: Diagnosis not present

## 2016-12-21 DIAGNOSIS — N401 Enlarged prostate with lower urinary tract symptoms: Secondary | ICD-10-CM | POA: Diagnosis not present

## 2016-12-21 DIAGNOSIS — S24104S Unspecified injury at T11-T12 level of thoracic spinal cord, sequela: Secondary | ICD-10-CM | POA: Diagnosis not present

## 2016-12-22 DIAGNOSIS — D508 Other iron deficiency anemias: Secondary | ICD-10-CM | POA: Diagnosis not present

## 2016-12-22 DIAGNOSIS — I10 Essential (primary) hypertension: Secondary | ICD-10-CM | POA: Diagnosis not present

## 2016-12-22 DIAGNOSIS — K592 Neurogenic bowel, not elsewhere classified: Secondary | ICD-10-CM | POA: Diagnosis not present

## 2016-12-22 DIAGNOSIS — L89154 Pressure ulcer of sacral region, stage 4: Secondary | ICD-10-CM | POA: Diagnosis not present

## 2016-12-22 DIAGNOSIS — N319 Neuromuscular dysfunction of bladder, unspecified: Secondary | ICD-10-CM | POA: Diagnosis not present

## 2016-12-22 DIAGNOSIS — N401 Enlarged prostate with lower urinary tract symptoms: Secondary | ICD-10-CM | POA: Diagnosis not present

## 2016-12-22 DIAGNOSIS — G8220 Paraplegia, unspecified: Secondary | ICD-10-CM | POA: Diagnosis not present

## 2016-12-22 DIAGNOSIS — S24104S Unspecified injury at T11-T12 level of thoracic spinal cord, sequela: Secondary | ICD-10-CM | POA: Diagnosis not present

## 2016-12-22 DIAGNOSIS — Z433 Encounter for attention to colostomy: Secondary | ICD-10-CM | POA: Diagnosis not present

## 2016-12-23 DIAGNOSIS — N401 Enlarged prostate with lower urinary tract symptoms: Secondary | ICD-10-CM | POA: Diagnosis not present

## 2016-12-23 DIAGNOSIS — S24104S Unspecified injury at T11-T12 level of thoracic spinal cord, sequela: Secondary | ICD-10-CM | POA: Diagnosis not present

## 2016-12-23 DIAGNOSIS — D508 Other iron deficiency anemias: Secondary | ICD-10-CM | POA: Diagnosis not present

## 2016-12-23 DIAGNOSIS — L89154 Pressure ulcer of sacral region, stage 4: Secondary | ICD-10-CM | POA: Diagnosis not present

## 2016-12-23 DIAGNOSIS — Z433 Encounter for attention to colostomy: Secondary | ICD-10-CM | POA: Diagnosis not present

## 2016-12-23 DIAGNOSIS — N319 Neuromuscular dysfunction of bladder, unspecified: Secondary | ICD-10-CM | POA: Diagnosis not present

## 2016-12-23 DIAGNOSIS — G8220 Paraplegia, unspecified: Secondary | ICD-10-CM | POA: Diagnosis not present

## 2016-12-23 DIAGNOSIS — I10 Essential (primary) hypertension: Secondary | ICD-10-CM | POA: Diagnosis not present

## 2016-12-23 DIAGNOSIS — K592 Neurogenic bowel, not elsewhere classified: Secondary | ICD-10-CM | POA: Diagnosis not present

## 2016-12-24 DIAGNOSIS — G8220 Paraplegia, unspecified: Secondary | ICD-10-CM | POA: Diagnosis not present

## 2016-12-24 DIAGNOSIS — N401 Enlarged prostate with lower urinary tract symptoms: Secondary | ICD-10-CM | POA: Diagnosis not present

## 2016-12-24 DIAGNOSIS — I10 Essential (primary) hypertension: Secondary | ICD-10-CM | POA: Diagnosis not present

## 2016-12-24 DIAGNOSIS — D508 Other iron deficiency anemias: Secondary | ICD-10-CM | POA: Diagnosis not present

## 2016-12-24 DIAGNOSIS — Z433 Encounter for attention to colostomy: Secondary | ICD-10-CM | POA: Diagnosis not present

## 2016-12-24 DIAGNOSIS — N319 Neuromuscular dysfunction of bladder, unspecified: Secondary | ICD-10-CM | POA: Diagnosis not present

## 2016-12-24 DIAGNOSIS — L89154 Pressure ulcer of sacral region, stage 4: Secondary | ICD-10-CM | POA: Diagnosis not present

## 2016-12-24 DIAGNOSIS — S24104S Unspecified injury at T11-T12 level of thoracic spinal cord, sequela: Secondary | ICD-10-CM | POA: Diagnosis not present

## 2016-12-24 DIAGNOSIS — K592 Neurogenic bowel, not elsewhere classified: Secondary | ICD-10-CM | POA: Diagnosis not present

## 2016-12-26 ENCOUNTER — Other Ambulatory Visit: Payer: Self-pay

## 2016-12-26 DIAGNOSIS — I10 Essential (primary) hypertension: Secondary | ICD-10-CM | POA: Diagnosis not present

## 2016-12-26 DIAGNOSIS — G8220 Paraplegia, unspecified: Secondary | ICD-10-CM | POA: Diagnosis not present

## 2016-12-26 DIAGNOSIS — K592 Neurogenic bowel, not elsewhere classified: Secondary | ICD-10-CM | POA: Diagnosis not present

## 2016-12-26 DIAGNOSIS — N319 Neuromuscular dysfunction of bladder, unspecified: Secondary | ICD-10-CM | POA: Diagnosis not present

## 2016-12-26 DIAGNOSIS — N401 Enlarged prostate with lower urinary tract symptoms: Secondary | ICD-10-CM | POA: Diagnosis not present

## 2016-12-26 DIAGNOSIS — S24104S Unspecified injury at T11-T12 level of thoracic spinal cord, sequela: Secondary | ICD-10-CM | POA: Diagnosis not present

## 2016-12-26 DIAGNOSIS — D508 Other iron deficiency anemias: Secondary | ICD-10-CM | POA: Diagnosis not present

## 2016-12-26 DIAGNOSIS — Z433 Encounter for attention to colostomy: Secondary | ICD-10-CM | POA: Diagnosis not present

## 2016-12-26 DIAGNOSIS — L89154 Pressure ulcer of sacral region, stage 4: Secondary | ICD-10-CM | POA: Diagnosis not present

## 2016-12-26 NOTE — Patient Outreach (Signed)
[facility]) Care Management  12/26/2016  Bradley [name] 17-Jan-1946 [id number]   Transition of Care Referral  Referral Date: 12/06/16 Referral Source: [employer] Discharge Report Date of Admission: 11/29/16 Diagnosis: fecal incontinence Date of Discharge: 12/06/16 Facility: [place] Medicare    Multiple attempts to establish contact with patient without success. No response from letter mailed to patient. Case is being closed at this time.   Plan: RN CM will notify [facility] administrative assistant of case status. RN CM will send MD case closure letter.   Enzo Montgomery, RN,BSN,CCM [place] Management Telephonic Care Management Coordinator Direct Phone: [phone] Toll Free: [phone] Fax: [phone]

## 2016-12-27 DIAGNOSIS — L89154 Pressure ulcer of sacral region, stage 4: Secondary | ICD-10-CM | POA: Diagnosis not present

## 2016-12-27 DIAGNOSIS — A419 Sepsis, unspecified organism: Secondary | ICD-10-CM | POA: Diagnosis not present

## 2016-12-27 DIAGNOSIS — M869 Osteomyelitis, unspecified: Secondary | ICD-10-CM | POA: Diagnosis not present

## 2016-12-27 DIAGNOSIS — L089 Local infection of the skin and subcutaneous tissue, unspecified: Secondary | ICD-10-CM | POA: Diagnosis not present

## 2016-12-28 DIAGNOSIS — G8220 Paraplegia, unspecified: Secondary | ICD-10-CM | POA: Diagnosis not present

## 2016-12-28 DIAGNOSIS — Z433 Encounter for attention to colostomy: Secondary | ICD-10-CM | POA: Diagnosis not present

## 2016-12-28 DIAGNOSIS — I10 Essential (primary) hypertension: Secondary | ICD-10-CM | POA: Diagnosis not present

## 2016-12-28 DIAGNOSIS — N319 Neuromuscular dysfunction of bladder, unspecified: Secondary | ICD-10-CM | POA: Diagnosis not present

## 2016-12-28 DIAGNOSIS — L89154 Pressure ulcer of sacral region, stage 4: Secondary | ICD-10-CM | POA: Diagnosis not present

## 2016-12-28 DIAGNOSIS — D508 Other iron deficiency anemias: Secondary | ICD-10-CM | POA: Diagnosis not present

## 2016-12-28 DIAGNOSIS — S24104S Unspecified injury at T11-T12 level of thoracic spinal cord, sequela: Secondary | ICD-10-CM | POA: Diagnosis not present

## 2016-12-28 DIAGNOSIS — N401 Enlarged prostate with lower urinary tract symptoms: Secondary | ICD-10-CM | POA: Diagnosis not present

## 2016-12-28 DIAGNOSIS — K592 Neurogenic bowel, not elsewhere classified: Secondary | ICD-10-CM | POA: Diagnosis not present

## 2016-12-28 DIAGNOSIS — L8994 Pressure ulcer of unspecified site, stage 4: Secondary | ICD-10-CM | POA: Diagnosis not present

## 2017-01-02 DIAGNOSIS — Z09 Encounter for follow-up examination after completed treatment for conditions other than malignant neoplasm: Secondary | ICD-10-CM | POA: Diagnosis not present

## 2017-01-04 DIAGNOSIS — N401 Enlarged prostate with lower urinary tract symptoms: Secondary | ICD-10-CM | POA: Diagnosis not present

## 2017-01-04 DIAGNOSIS — S24104S Unspecified injury at T11-T12 level of thoracic spinal cord, sequela: Secondary | ICD-10-CM | POA: Diagnosis not present

## 2017-01-04 DIAGNOSIS — G8220 Paraplegia, unspecified: Secondary | ICD-10-CM | POA: Diagnosis not present

## 2017-01-04 DIAGNOSIS — N319 Neuromuscular dysfunction of bladder, unspecified: Secondary | ICD-10-CM | POA: Diagnosis not present

## 2017-01-04 DIAGNOSIS — K592 Neurogenic bowel, not elsewhere classified: Secondary | ICD-10-CM | POA: Diagnosis not present

## 2017-01-04 DIAGNOSIS — D508 Other iron deficiency anemias: Secondary | ICD-10-CM | POA: Diagnosis not present

## 2017-01-04 DIAGNOSIS — L89154 Pressure ulcer of sacral region, stage 4: Secondary | ICD-10-CM | POA: Diagnosis not present

## 2017-01-04 DIAGNOSIS — Z433 Encounter for attention to colostomy: Secondary | ICD-10-CM | POA: Diagnosis not present

## 2017-01-04 DIAGNOSIS — I10 Essential (primary) hypertension: Secondary | ICD-10-CM | POA: Diagnosis not present

## 2017-01-06 DIAGNOSIS — L89154 Pressure ulcer of sacral region, stage 4: Secondary | ICD-10-CM | POA: Diagnosis not present

## 2017-01-06 DIAGNOSIS — Z433 Encounter for attention to colostomy: Secondary | ICD-10-CM | POA: Diagnosis not present

## 2017-01-06 DIAGNOSIS — G8220 Paraplegia, unspecified: Secondary | ICD-10-CM | POA: Diagnosis not present

## 2017-01-06 DIAGNOSIS — K592 Neurogenic bowel, not elsewhere classified: Secondary | ICD-10-CM | POA: Diagnosis not present

## 2017-01-06 DIAGNOSIS — S24104S Unspecified injury at T11-T12 level of thoracic spinal cord, sequela: Secondary | ICD-10-CM | POA: Diagnosis not present

## 2017-01-06 DIAGNOSIS — N319 Neuromuscular dysfunction of bladder, unspecified: Secondary | ICD-10-CM | POA: Diagnosis not present

## 2017-01-06 DIAGNOSIS — I10 Essential (primary) hypertension: Secondary | ICD-10-CM | POA: Diagnosis not present

## 2017-01-06 DIAGNOSIS — N401 Enlarged prostate with lower urinary tract symptoms: Secondary | ICD-10-CM | POA: Diagnosis not present

## 2017-01-06 DIAGNOSIS — D508 Other iron deficiency anemias: Secondary | ICD-10-CM | POA: Diagnosis not present

## 2017-01-09 DIAGNOSIS — N401 Enlarged prostate with lower urinary tract symptoms: Secondary | ICD-10-CM | POA: Diagnosis not present

## 2017-01-09 DIAGNOSIS — S24104S Unspecified injury at T11-T12 level of thoracic spinal cord, sequela: Secondary | ICD-10-CM | POA: Diagnosis not present

## 2017-01-09 DIAGNOSIS — G8220 Paraplegia, unspecified: Secondary | ICD-10-CM | POA: Diagnosis not present

## 2017-01-09 DIAGNOSIS — Z433 Encounter for attention to colostomy: Secondary | ICD-10-CM | POA: Diagnosis not present

## 2017-01-09 DIAGNOSIS — N319 Neuromuscular dysfunction of bladder, unspecified: Secondary | ICD-10-CM | POA: Diagnosis not present

## 2017-01-09 DIAGNOSIS — D508 Other iron deficiency anemias: Secondary | ICD-10-CM | POA: Diagnosis not present

## 2017-01-09 DIAGNOSIS — L89154 Pressure ulcer of sacral region, stage 4: Secondary | ICD-10-CM | POA: Diagnosis not present

## 2017-01-09 DIAGNOSIS — I10 Essential (primary) hypertension: Secondary | ICD-10-CM | POA: Diagnosis not present

## 2017-01-09 DIAGNOSIS — K592 Neurogenic bowel, not elsewhere classified: Secondary | ICD-10-CM | POA: Diagnosis not present

## 2017-01-10 ENCOUNTER — Encounter: Payer: Medicare HMO | Attending: Physician Assistant | Admitting: Physician Assistant

## 2017-01-10 DIAGNOSIS — M4628 Osteomyelitis of vertebra, sacral and sacrococcygeal region: Secondary | ICD-10-CM | POA: Insufficient documentation

## 2017-01-10 DIAGNOSIS — I10 Essential (primary) hypertension: Secondary | ICD-10-CM | POA: Insufficient documentation

## 2017-01-10 DIAGNOSIS — Z87891 Personal history of nicotine dependence: Secondary | ICD-10-CM | POA: Insufficient documentation

## 2017-01-10 DIAGNOSIS — L89154 Pressure ulcer of sacral region, stage 4: Secondary | ICD-10-CM | POA: Diagnosis not present

## 2017-01-10 DIAGNOSIS — G8220 Paraplegia, unspecified: Secondary | ICD-10-CM | POA: Insufficient documentation

## 2017-01-10 DIAGNOSIS — M069 Rheumatoid arthritis, unspecified: Secondary | ICD-10-CM | POA: Insufficient documentation

## 2017-01-10 NOTE — Progress Notes (Addendum)
DELRICO, MINEHART ([id number]) Visit Report for 01/10/2017 Allergy List Details Patient Name: Bradley [name], Bradley [name]. Date of Service: 01/10/2017 8:00 AM Medical Record Number: [id number] Patient Account Number: [id number] Date of Birth/Sex: 10-27-1945 (71 y.o. Male) Treating RN: Montey Hora Primary Care Zaylin Pistilli: [name] Other Clinician: Referring Jakyra Kenealy: Referral, Self Treating Haneef Hallquist/Extender: STONE III, HOYT Weeks in Treatment: 0 Allergies Active Allergies No Known Allergies Allergy Notes Electronic Signature(s) Signed: 01/10/2017 7:34:43 AM By: Montey Hora Entered By: Montey Hora on 01/10/2017 07:34:43 [name] ([id number]) -------------------------------------------------------------------------------- Arrival Information Details Patient Name: Bradley Loa A. Date of Service: 01/10/2017 8:00 AM Medical Record Number: [id number] Patient Account Number: [id number] Date of Birth/Sex: December 25, 1945 (71 y.o. Male) Treating RN: Montey Hora Primary Care Raziah Funnell: [name] Other Clinician: Referring Aalayah Riles: Referral, Self Treating Shevy Yaney/Extender: [name], HOYT Weeks in Treatment: 0 Visit Information Patient Arrived: Wheel Chair Arrival Time: 08:15 Accompanied By: self Transfer Assistance: Hoyer Lift Patient Identification Verified: Yes Secondary Verification Process Yes Completed: History Since Last Visit Added or deleted any medications: No Any new allergies or adverse reactions: No Had a fall or experienced change in activities of daily living that may affect risk of falls: No Signs or symptoms of abuse/neglect since last visito No Hospitalized since last visit: No Has Dressing in Place as Prescribed: Yes Electronic Signature(s) Signed: 01/10/2017 5:55:03 PM By: Montey Hora Entered By: Montey Hora on 01/10/2017 08:15:49 [name]  ([id number]) -------------------------------------------------------------------------------- Clinic Level of Care Assessment Details Patient Name: Bradley Loa A. Date of Service: 01/10/2017 8:00 AM Medical Record Number: [id number] Patient Account Number: [id number] Date of Birth/Sex: 11-27-1945 (71 y.o. Male) Treating RN: Montey Hora Primary Care Alyha Marines: [name] Other Clinician: Referring Kidus Delman: Referral, Self Treating Malcome Ambrocio/Extender: [name], HOYT Weeks in Treatment: 0 Clinic Level of Care Assessment Items TOOL 2 Quantity Score []  - Use when only an EandM is performed on the INITIAL visit 0 ASSESSMENTS - Nursing Assessment / Reassessment X - General Physical Exam (combine w/ comprehensive assessment (listed just 1 20 below) when performed on new pt. evals) X - Comprehensive Assessment (HX, ROS, Risk Assessments, Wounds Hx, etc.) 1 25 ASSESSMENTS - Wound and Skin Assessment / Reassessment X - Simple Wound Assessment / Reassessment - one wound 1 5 []  - Complex Wound Assessment / Reassessment - multiple wounds 0 []  - Dermatologic / Skin Assessment (not related to wound area) 0 ASSESSMENTS - Ostomy and/or Continence Assessment and Care []  - Incontinence Assessment and Management 0 []  - Ostomy Care Assessment and Management (repouching, etc.) 0 PROCESS - Coordination of Care X - Simple Patient / Family Education for ongoing care 1 15 []  - Complex (extensive) Patient / Family Education for ongoing care 0 X - Staff obtains Programmer, systems, Records, Test Results / Process Orders 1 10 []  - Staff telephones HHA, Nursing Homes / Clarify orders / etc 0 []  - Routine Transfer to another Facility (non-emergent condition) 0 []  - Routine Hospital Admission (non-emergent condition) 0 []  - New Admissions / Biomedical engineer / Ordering NPWT, Apligraf, etc. 0 []  - Emergency Hospital Admission (emergent condition) 0 X - Simple Discharge Coordination 1 10 JAIZON, DEROOS A.  ([id number]) []  - Complex (extensive) Discharge Coordination 0 PROCESS - Special Needs []  - Pediatric / Minor Patient Management 0 []  - Isolation Patient Management 0 []  - Hearing / Language / Visual special needs 0 []  - Assessment of Community assistance (transportation, D/C planning, etc.) 0 []  - Additional assistance / Altered mentation 0 []  - Support Surface(s) Assessment (bed, cushion,  seat, etc.) 0 INTERVENTIONS - Wound Cleansing / Measurement X - Wound Imaging (photographs - any number of wounds) 1 5 []  - Wound Tracing (instead of photographs) 0 X - Simple Wound Measurement - one wound 1 5 []  - Complex Wound Measurement - multiple wounds 0 X - Simple Wound Cleansing - one wound 1 5 []  - Complex Wound Cleansing - multiple wounds 0 INTERVENTIONS - Wound Dressings X - Small Wound Dressing one or multiple wounds 1 10 []  - Medium Wound Dressing one or multiple wounds 0 []  - Large Wound Dressing one or multiple wounds 0 []  - Application of Medications - injection 0 INTERVENTIONS - Miscellaneous []  - External ear exam 0 []  - Specimen Collection (cultures, biopsies, blood, body fluids, etc.) 0 []  - Specimen(s) / Culture(s) sent or taken to Lab for analysis 0 X - Patient Transfer (multiple staff / Civil Service fast streamer / Similar devices) 1 10 []  - Simple Staple / Suture removal (25 or less) 0 []  - Complex Staple / Suture removal (26 or more) 0 Roy, Danyl A. ([id number]) []  - Hypo / Hyperglycemic Management (close monitor of Blood Glucose) 0 []  - Ankle / Brachial Index (ABI) - do not check if billed separately 0 Has the patient been seen at the hospital within the last three years: Yes Total Score: 120 Level Of Care: New/Established - Level 4 Electronic Signature(s) Signed: 01/10/2017 5:55:03 PM By: Montey Hora Entered By: Montey Hora on 01/10/2017 17:17:33 [name] ([id number]) -------------------------------------------------------------------------------- Encounter Discharge  Information Details Patient Name: Bradley Loa A. Date of Service: 01/10/2017 8:00 AM Medical Record Number: [id number] Patient Account Number: [id number] Date of Birth/Sex: 08-Apr-1946 (71 y.o. Male) Treating RN: Montey Hora Primary Care Yoshika Vensel: [name] Other Clinician: Referring Ashley Bultema: Referral, Self Treating Tawfiq Favila/Extender: [name], HOYT Weeks in Treatment: 0 Encounter Discharge Information Items Discharge Pain Level: 0 Discharge Condition: Stable Ambulatory Status: Wheelchair Discharge Destination: Home Transportation: Private Auto Accompanied By: self Schedule Follow-up Appointment: Yes Medication Reconciliation completed No and provided to Patient/Care Chanell Nadeau: Provided on Clinical Summary of Care: 01/10/2017 Form Type Recipient Paper Patient JG Electronic Signature(s) Signed: 01/10/2017 5:18:41 PM By: Montey Hora Previous Signature: 01/10/2017 9:19:28 AM Version By: Ruthine Dose Entered By: Montey Hora on 01/10/2017 17:18:40 [name] ([id number]) -------------------------------------------------------------------------------- Lower Extremity Assessment Details Patient Name: Bradley Loa A. Date of Service: 01/10/2017 8:00 AM Medical Record Number: [id number] Patient Account Number: [id number] Date of Birth/Sex: April 18, 1946 (71 y.o. Male) Treating RN: Montey Hora Primary Care Licet Dunphy: [name] Other Clinician: Referring Jadarrius Maselli: Referral, Self Treating Ariba Lehnen/Extender: [name], HOYT Weeks in Treatment: 0 Electronic Signature(s) Signed: 01/10/2017 5:55:03 PM By: Montey Hora Entered By: Montey Hora on 01/10/2017 08:20:16 [name] ([id number]) -------------------------------------------------------------------------------- Multi Wound Chart Details Patient Name: Bradley Loa A. Date of Service: 01/10/2017 8:00 AM Medical Record Number: [id number] Patient Account Number: [id number] Date of Birth/Sex: 1945/08/20  (71 y.o. Male) Treating RN: Montey Hora Primary Care Kindra Bickham: [name] Other Clinician: Referring Telma Pyeatt: Referral, Self Treating Tywanna Seifer/Extender: [name], HOYT Weeks in Treatment: 0 Vital Signs Height(in): 75 Pulse(bpm): 84 Weight(lbs): 209 Blood Pressure 122/64 (mmHg): Body Mass Index(BMI): 26 Temperature(F): 98.1 Respiratory Rate 16 (breaths/min): Photos: [7:No Photos] [N/A:N/A] Wound Location: [7:Sacrum - Midline] [N/A:N/A] Wounding Event: [7:Pressure Injury] [N/A:N/A] Primary Etiology: [7:Pressure Ulcer] [N/A:N/A] Comorbid History: [7:Anemia, Hypertension, History of pressure wounds, Rheumatoid Arthritis, Paraplegia] [N/A:N/A] Date Acquired: [7:11/15/2016] [N/A:N/A] Weeks of Treatment: [7:0] [N/A:N/A] Wound Status: [7:Open] [N/A:N/A] Measurements L x W x D 12.8x7.1x5 [N/A:N/A] (cm) Area (cm) : [7:71.377] [N/A:N/A]  Volume (cm) : [2:376.283] [N/A:N/A] Classification: [7:Category/Stage IV] [N/A:N/A] Exudate Amount: [7:Large] [N/A:N/A] Exudate Type: [7:Serous] [N/A:N/A] Exudate Color: [7:amber] [N/A:N/A] Wound Margin: [7:Epibole] [N/A:N/A] Granulation Amount: [7:Large (67-100%)] [N/A:N/A] Granulation Quality: [7:Red] [N/A:N/A] Necrotic Amount: [7:Small (1-33%)] [N/A:N/A] Exposed Structures: [7:Fat Layer (Subcutaneous Tissue) Exposed: Yes Muscle: Yes Fascia: No Tendon: No Joint: No Bone: No] [N/A:N/A] Epithelialization: None N/A N/A Periwound Skin Texture: Excoriation: No N/A N/A Induration: No Callus: No Crepitus: No Rash: No Scarring: No Periwound Skin Maceration: Yes N/A N/A Moisture: Dry/Scaly: No Periwound Skin Color: Atrophie Blanche: No N/A N/A Cyanosis: No Ecchymosis: No Erythema: No Hemosiderin Staining: No Mottled: No Pallor: No Rubor: No Tenderness on No N/A N/A Palpation: Wound Preparation: Ulcer Cleansing: N/A N/A Rinsed/Irrigated with Saline Topical Anesthetic Applied: None Treatment Notes Electronic  Signature(s) Signed: 01/10/2017 5:55:03 PM By: Montey Hora Entered By: Montey Hora on 01/10/2017 08:56:07 [name] ([id number]) -------------------------------------------------------------------------------- [place] Details Patient Name: Bradley Loa A. Date of Service: 01/10/2017 8:00 AM Medical Record Number: [id number] Patient Account Number: [id number] Date of Birth/Sex: 1946-05-04 (71 y.o. Male) Treating RN: Montey Hora Primary Care Nyle Limb: [name] Other Clinician: Referring Laquonda Welby: Referral, Self Treating Crosby Bevan/Extender: [name], HOYT Weeks in Treatment: 0 Active Inactive ` Abuse / Safety / Falls / Self Care Management Nursing Diagnoses: Potential for falls Goals: Patient will not experience any injury related to falls Date Initiated: 01/10/2017 Target Resolution Date: 05/13/2017 Goal Status: Active Interventions: Assess: immobility, friction, shearing, incontinence upon admission and as needed Assess impairment of mobility on admission and as needed per policy Assess personal safety and home safety (as indicated) on admission and as needed Assess self care needs on admission and as needed Notes: ` Orientation to the Wound Care Program Nursing Diagnoses: Knowledge deficit related to the wound healing center program Goals: Patient/caregiver will verbalize understanding of the [place] Program Date Initiated: 01/10/2017 Target Resolution Date: 02/11/2017 Goal Status: Active Interventions: Provide education on orientation to the wound center Notes: ` Pain, Acute or Chronic KNOWLEDGE, ESCANDON ([id number]) Nursing Diagnoses: Pain, acute or chronic: actual or potential Potential alteration in comfort, pain Goals: Patient/caregiver will verbalize adequate pain control between visits Date Initiated: 01/10/2017 Target Resolution Date: 04/15/2017 Goal Status: Active Interventions: Complete pain assessment as per  visit requirements Notes: ` Pressure Nursing Diagnoses: Knowledge deficit related to causes and risk factors for pressure ulcer development Knowledge deficit related to management of pressures ulcers Potential for impaired tissue integrity related to pressure, friction, moisture, and shear Goals: Patient will remain free from development of additional pressure ulcers Date Initiated: 01/10/2017 Target Resolution Date: 04/15/2017 Goal Status: Active Interventions: Assess: immobility, friction, shearing, incontinence upon admission and as needed Assess offloading mechanisms upon admission and as needed Assess potential for pressure ulcer upon admission and as needed Provide education on pressure ulcers Notes: ` Wound/Skin Impairment Nursing Diagnoses: Impaired tissue integrity Knowledge deficit related to ulceration/compromised skin integrity Goals: Ulcer/skin breakdown will have a volume reduction of 80% by week 12 Date Initiated: 01/10/2017 Target Resolution Date: 05/13/2017 Goal Status: Active DARIO, YONO ([id number]) Interventions: Assess patient/caregiver ability to perform ulcer/skin care regimen upon admission and as needed Assess ulceration(s) every visit Notes: Electronic Signature(s) Signed: 01/10/2017 5:55:03 PM By: Montey Hora Entered By: Montey Hora on 01/10/2017 08:55:52 [name] ([id number]) -------------------------------------------------------------------------------- Pain Assessment Details Patient Name: Bradley Loa A. Date of Service: 01/10/2017 8:00 AM Medical Record Number: [id number] Patient Account Number: [id number] Date of Birth/Sex: [birth year] (71 y.o. Male) Treating RN: Montey Hora Primary Care  Istvan Behar: [name] Other Clinician: Referring Latosha Gaylord: Referral, Self Treating Mallori Araque/Extender: [name], HOYT Weeks in Treatment: 0 Active Problems Location of Pain Severity and Description of Pain Patient Has Paino No Site  Locations Pain Management and Medication Current Pain Management: Electronic Signature(s) Signed: 01/10/2017 5:55:03 PM By: Montey Hora Entered By: Montey Hora on 01/10/2017 08:15:57 [name] ([id number]) -------------------------------------------------------------------------------- Patient/Caregiver Education Details Patient Name: Bradley Loa A. Date of Service: 01/10/2017 8:00 AM Medical Record Number: [id number] Patient Account Number: [id number] Date of Birth/Gender: [birth year] (71 y.o. Male) Treating RN: Montey Hora Primary Care Physician: [name] Other Clinician: Referring Physician: Referral, Self Treating Physician/Extender: Sharalyn Ink in Treatment: 0 Education Assessment Education Provided To: Patient Education Topics Provided Wound/Skin Impairment: Handouts: Other: wound care as ordered Methods: Demonstration, Explain/Verbal Responses: State content correctly Electronic Signature(s) Signed: 01/10/2017 5:55:03 PM By: Montey Hora Entered By: Montey Hora on 01/10/2017 17:18:58 [name] ([id number]) -------------------------------------------------------------------------------- Wound Assessment Details Patient Name: Bradley Loa A. Date of Service: 01/10/2017 8:00 AM Medical Record Number: [id number] Patient Account Number: [id number] Date of Birth/Sex: [birth year] (71 y.o. Male) Treating RN: Montey Hora Primary Care Talvin Christianson: [name] Other Clinician: Referring Joslin Doell: Referral, Self Treating Van Ehlert/Extender: [name], HOYT Weeks in Treatment: 0 Wound Status Wound Number: 7 Primary Pressure Ulcer Etiology: Wound Location: Sacrum - Midline Wound Open Wounding Event: Pressure Injury Status: Date Acquired: 11/15/2016 Comorbid Anemia, Hypertension, History of Weeks Of Treatment: 0 History: pressure wounds, Rheumatoid Arthritis, Clustered Wound: No Paraplegia Photos Photo Uploaded By: Montey Hora on 01/10/2017 12:49:18 Wound Measurements Length: (cm) 12.8 Width: (cm) 7.1 Depth: (cm) 5 Area: (cm) 71.377 Volume: (cm) 356.885 % Reduction in Area: % Reduction in Volume: Epithelialization: None Tunneling: No Undermining: No Wound Description Classification: Category/Stage IV Wound Margin: Epibole Exudate Amount: Large Exudate Type: Serous Exudate Color: amber Foul Odor After Cleansing: No Slough/Fibrino Yes Wound Bed Granulation Amount: Large (67-100%) Exposed Structure Granulation Quality: Red Fascia Exposed: No Necrotic Amount: Small (1-33%) Fat Layer (Subcutaneous Tissue) Exposed: Yes Necrotic Quality: Adherent Slough Tendon Exposed: No DONNE, BALEY A. ([id number]) Muscle Exposed: Yes Necrosis of Muscle: No Joint Exposed: No Bone Exposed: No Periwound Skin Texture Texture Color No Abnormalities Noted: No No Abnormalities Noted: No Callus: No Atrophie Blanche: No Crepitus: No Cyanosis: No Excoriation: No Ecchymosis: No Induration: No Erythema: No Rash: No Hemosiderin Staining: No Scarring: No Mottled: No Pallor: No Moisture Rubor: No No Abnormalities Noted: No Dry / Scaly: No Maceration: Yes Wound Preparation Ulcer Cleansing: Rinsed/Irrigated with Saline Topical Anesthetic Applied: None Treatment Notes Wound #7 (Midline Sacrum) 1. Cleansed with: Clean wound with Normal Saline 2. Anesthetic Topical Lidocaine 4% cream to wound bed prior to debridement 4. Dressing Applied: Saline moistened guaze 5. Secondary Dressing Applied ABD Pad Dry Gauze 7. Secured with Recruitment consultant) Signed: 01/10/2017 5:55:03 PM By: Montey Hora Entered By: Montey Hora on 01/10/2017 08:32:32 [name] ([id number]) -------------------------------------------------------------------------------- Vitals Details Patient Name: Bradley Loa A. Date of Service: 01/10/2017 8:00 AM Medical Record Number: [id number] Patient Account Number:  [id number] Date of Birth/Sex: 1946/04/09 (71 y.o. Male) Treating RN: Montey Hora Primary Care Breion Novacek: [name] Other Clinician: Referring Vasil Juhasz: Referral, Self Treating Aundre Hietala/Extender: [name], HOYT Weeks in Treatment: 0 Vital Signs Time Taken: 08:16 Temperature (F): 98.1 Height (in): 75 Pulse (bpm): 84 Source: Stated Respiratory Rate (breaths/min): 16 Weight (lbs): 209 Blood Pressure (mmHg): 122/64 Source: Stated Reference Range: 80 - 120 mg / dl Body Mass Index (BMI): 26.1 Electronic Signature(s) Signed: 01/10/2017 5:55:03 PM  By: Montey Hora Entered ByMontey Hora on 01/10/2017 08:19:54

## 2017-01-11 DIAGNOSIS — N319 Neuromuscular dysfunction of bladder, unspecified: Secondary | ICD-10-CM | POA: Diagnosis not present

## 2017-01-11 DIAGNOSIS — I10 Essential (primary) hypertension: Secondary | ICD-10-CM | POA: Diagnosis not present

## 2017-01-11 DIAGNOSIS — D508 Other iron deficiency anemias: Secondary | ICD-10-CM | POA: Diagnosis not present

## 2017-01-11 DIAGNOSIS — Z433 Encounter for attention to colostomy: Secondary | ICD-10-CM | POA: Diagnosis not present

## 2017-01-11 DIAGNOSIS — N401 Enlarged prostate with lower urinary tract symptoms: Secondary | ICD-10-CM | POA: Diagnosis not present

## 2017-01-11 DIAGNOSIS — K592 Neurogenic bowel, not elsewhere classified: Secondary | ICD-10-CM | POA: Diagnosis not present

## 2017-01-11 DIAGNOSIS — G8220 Paraplegia, unspecified: Secondary | ICD-10-CM | POA: Diagnosis not present

## 2017-01-11 DIAGNOSIS — S24104S Unspecified injury at T11-T12 level of thoracic spinal cord, sequela: Secondary | ICD-10-CM | POA: Diagnosis not present

## 2017-01-11 DIAGNOSIS — L89154 Pressure ulcer of sacral region, stage 4: Secondary | ICD-10-CM | POA: Diagnosis not present

## 2017-01-11 NOTE — Progress Notes (Signed)
Bradley [name], Bradley [name] ([id number]) Visit Report for 01/10/2017 Abuse/Suicide Risk Screen Details Patient Name: Bradley [name], Bradley [name]. Date of Service: 01/10/2017 8:00 AM Medical Record Number: [id number] Patient Account Number: [id number] Date of Birth/Sex: 06-01-1946 (71 y.o. Male) Treating RN: Montey Hora Primary Care Aldean Pipe: [name] Other Clinician: Referring Nazaria Riesen: Referral, Self Treating Yeng Frankie/Extender: [name], HOYT Weeks in Treatment: 0 Abuse/Suicide Risk Screen Items Answer ABUSE/SUICIDE RISK SCREEN: Has anyone close to you tried to hurt or harm you recentlyo No Do you feel uncomfortable with anyone in your familyo No Has anyone forced you do things that you didnot want to doo No Do you have any thoughts of harming yourselfo No Patient displays signs or symptoms of abuse and/or neglect. No Electronic Signature(s) Signed: 01/10/2017 7:35:18 AM By: Montey Hora Entered By: Montey Hora on 01/10/2017 07:35:18 Bradley Hidden ([id number]) -------------------------------------------------------------------------------- Activities of Daily Living Details Patient Name: Bradley [name], Bradley A. Date of Service: 01/10/2017 8:00 AM Medical Record Number: [id number] Patient Account Number: [id number] Date of Birth/Sex: 29-Feb-1946 (71 y.o. Male) Treating RN: Montey Hora Primary Care Conni Knighton: [name] Other Clinician: Referring Isabel Ardila: Referral, Self Treating Aicha Clingenpeel/Extender: [name], HOYT Weeks in Treatment: 0 Activities of Daily Living Items Answer Activities of Daily Living (Please select one for each item) Drive Automobile Not Able Take Medications Completely Able Use Telephone Completely Able Care for Appearance Completely Able Use Toilet Need Assistance Bath / Shower Need Assistance Dress Self Need Assistance Feed Self Completely Able Walk Need Assistance Get In / Out Bed Need Assistance Housework Need Assistance Prepare Meals Need  Assistance Handle Money Completely Able Shop for Self Need Assistance Electronic Signature(s) Signed: 01/10/2017 7:35:56 AM By: Montey Hora Entered By: Montey Hora on 01/10/2017 07:35:55 Bradley Hidden ([id number]) -------------------------------------------------------------------------------- Education Assessment Details Patient Name: Bradley Loa A. Date of Service: 01/10/2017 8:00 AM Medical Record Number: [id number] Patient Account Number: [id number] Date of Birth/Sex: 18-Apr-1946 (71 y.o. Male) Treating RN: Montey Hora Primary Care Mose Colaizzi: [name] Other Clinician: Referring Tanishi Nault: Referral, Self Treating Thoams Siefert/Extender: Sharalyn Ink in Treatment: 0 Primary Learner Assessed: Patient Learning Preferences/Education Level/Primary Language Learning Preference: Explanation, Demonstration Highest Education Level: High School Preferred Language: English Cognitive Barrier Assessment/Beliefs Language Barrier: No Translator Needed: No Memory Deficit: No Emotional Barrier: No Cultural/Religious Beliefs Affecting Medical No Care: Physical Barrier Assessment Impaired Vision: No Impaired Hearing: No Decreased Hand dexterity: No Knowledge/Comprehension Assessment Knowledge Level: Medium Comprehension Level: Medium Ability to understand written Medium instructions: Ability to understand verbal Medium instructions: Motivation Assessment Anxiety Level: Calm Cooperation: Cooperative Education Importance: Acknowledges Need Interest in Health Problems: Asks Questions Perception: Coherent Willingness to Engage in Self- Medium Management Activities: Readiness to Engage in Self- Medium Management Activities: Electronic Signature(s) Bradley [name], Bradley [name] ([id number]) Signed: 01/10/2017 7:36:28 AM By: Montey Hora Entered By: Montey Hora on 01/10/2017 07:36:27 Bradley Hidden  ([id number]) -------------------------------------------------------------------------------- Fall Risk Assessment Details Patient Name: Bradley Hidden. Date of Service: 01/10/2017 8:00 AM Medical Record Number: [id number] Patient Account Number: [id number] Date of Birth/Sex: [birth year] (71 y.o. Male) Treating RN: Montey Hora Primary Care Dalonda Simoni: [name] Other Clinician: Referring Kyana Aicher: Referral, Self Treating Detric Scalisi/Extender: [name], HOYT Weeks in Treatment: 0 Fall Risk Assessment Items Have you had 2 or more falls in the last 12 monthso 0 No Have you had any fall that resulted in injury in the last 12 monthso 0 No FALL RISK ASSESSMENT: History of falling - immediate or within 3 months 0 No Secondary diagnosis 0 No Ambulatory aid None/bed rest/wheelchair/nurse 0 Yes Crutches/cane/walker  0 No Furniture 0 No IV Access/Saline Lock 0 No Gait/Training Normal/bed rest/immobile 0 Yes Weak 0 No Impaired 0 No Mental Status Oriented to own ability 0 Yes Electronic Signature(s) Signed: 01/10/2017 7:36:59 AM By: Montey Hora Entered By: Montey Hora on 01/10/2017 07:36:59 Bradley Hidden ([id number]) -------------------------------------------------------------------------------- Nutrition Risk Assessment Details Patient Name: Bradley Loa A. Date of Service: 01/10/2017 8:00 AM Medical Record Number: [id number] Patient Account Number: [id number] Date of Birth/Sex: [birth year] (71 y.o. Male) Treating RN: Montey Hora Primary Care Evolett Somarriba: [name] Other Clinician: Referring Dustie Brittle: Referral, Self Treating Rodell Marrs/Extender: STONE III, HOYT Weeks in Treatment: 0 Height (in): 75 Weight (lbs): Body Mass Index (BMI): Nutrition Risk Assessment Items NUTRITION RISK SCREEN: I have an illness or condition that made me change the kind and/or 0 No amount of food I eat I eat fewer than two meals per day 0 No I eat few fruits and vegetables, or milk  products 0 No I have three or more drinks of beer, liquor or wine almost every day 0 No I have tooth or mouth problems that make it hard for me to eat 0 No I don't always have enough money to buy the food I need 0 No I eat alone most of the time 0 No I take three or more different prescribed or over-the-counter drugs a 1 Yes day Without wanting to, I have lost or gained 10 pounds in the last six 0 No months I am not always physically able to shop, cook and/or feed myself 0 No Nutrition Protocols Good Risk Protocol 0 No interventions needed Moderate Risk Protocol Electronic Signature(s) Signed: 01/10/2017 7:37:07 AM By: Montey Hora Entered By: Montey Hora on 01/10/2017 07:37:07

## 2017-01-12 DIAGNOSIS — M8668 Other chronic osteomyelitis, other site: Secondary | ICD-10-CM | POA: Diagnosis not present

## 2017-01-12 NOTE — Progress Notes (Signed)
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 a diverting colostomy at one point. In any case he is only been using wet to dry dressings surprisingly changing this himself at home using a mirror. He does not have home health. He does have a level II pressure-relief surface as well as a Roho cushion for his wheelchair. In spite of this the wound is considerably larger one than when he was last in the clinic currently measuring 12.5 x 7. There is also an area superiorly in the wound that tunnels more deeply. Clearly a stage III wound 03/15/16 patient presents today for reevaluation concerning his midline sacral pressure ulcer. This again is an extensive ulcer which does not extend to bone fortunately but is sufficiently large to make healing of this wound difficult. Again he has been seen at [facility] where apparently they did discuss with him the possibility of a diverting colostomy but he did not want any part of that. Subsequently he has not followed up there currently. He continues overall to do fairly well all things considered with this wound. He  is currently utilizing Medihoney Santyl would be extremely expensive for the amount he would need and likely cost prohibitive. 03/29/16; we'll follow this patient on an every two-week basis. He has a fairly substantial stage 3 pressure ulcer over his lower sacrum and coccyx and extending into his bilateral gluteal areas left greater than right. He now has home health. [facility] think [facility]. He is applying  Medihoney, kerlix and border foam. He arrives today with the intake nurse reporting a large amount of drainage. The patient stated he put his dressing on it 7:00 this morning by the time he arrived here at 10 there was already a moderate to a large amount of drainage. I once again reviewed his history. He had an attempted closure with myocutaneous flap earlier this year at [facility]. This did not go well. He was offered a diverting colostomy but refused. He is not a candidate for a wound VAC as the actual wound is precariously close to his anal opening. As mentioned he does have [facility] but miraculously this patient who is a paraplegic is actually changing the dressings himself. 04/12/16 patient presents today for a follow-up of his essentially large sacral pressure ulcer stage III. Nothing has changed dramatically since I last saw him about one month ago. He has seen Dr. Dellia Nims once the interim. With that being said patient's wound appears somewhat less macerated today compared to previous evaluations. He still has no pain being a quadriplegic. 04-26-16 Mr. Brubacher returns today for a violation of his stage III sacral pressure ulcer he denies any complaints concerns or issues over the past 2 weeks. He missed to changing dressing twice daily due to drainage although he states this is not an increase in drainage over the past 2 weeks. He does change his dressings independently. He admits to sitting in his motorized chair for no more than 2-3 hours at which time he transfers to bed and  rotates lateral position. 05/10/16; Effrey Davidow returns today for review of his stage III sacral pressure ulcer. He denies any concerns over the last 2 weeks although he seems to be running out of Aquacel Ag and on those days he uses Medihoney. He has [facility] was supplying his dressings. He still complains of drainage. He does his dressings independently. He has in his motorized chair for 2-3 hours that time other than that he offloads this. Dimensions of the wound are down 1 cm in both directions. He underwent an aggressive debridement on his last visit of thick circumferential skin and subcutaneous tissue. It is possible at some point in the future he is going to need this done again 05/24/16; the patient returns today for review of his stage III sacral pressure ulcer. We have been using Aquacel Ag he tells me that he changes this up to twice a day. I'm not really certain of the reason for this frequency of changing. He has some involvement from the home health nurses but I think is doing most of the changing himself which I think because of his paraplegia would be a very difficult exercise. Nevertheless he states that there is "wetness". I am not sure if there is another dressing that we could easily changed that much. I'd wanted to change to [facility] but I'll need to have a sense of how frequent he would need to change this. 06/14/16; this is a patient returns for review of his stage III sacral pressure ulcer. We have been using Aquacel Ag and over the last 2 visits he has had extensive debridement so of the thick circumferential skin and subcutaneous tissue that surrounds this wound. In spite of this really absolutely no change in the condition of the wound warrants measurements. We have [facility] home health I believe changing the dressing on Bradley [name], Bradley [name]. ([id number]) 3 occasions the patient states he does this on one occasion himself 06/28/16; this is  a patient who has a  fairly large stage III sacral pressure ulcer. I changed him to [facility] from Aquacel 2 weeks ago. He returns today in follow-up. In the meantime a nurse from [facility] has calledrequesting ordering of a wound VAC. He had this discussion before. The problem is the proximity of the lowest edge of this wound to the patient's anal opening roughly 3/4 of an inch. Can't see how this can be arranged. Apparently the nurse who is calling has a lot of experience, the question would be then when she is not available would be doing this. I would not have thought that this wound is not amenable to a wound VAC because of this reason 07/12/16; the patient comes in today and I have signed orders for a wound VAC. The home health team through [facility] is convinced that he can benefit from this even though there is close proximity to his anal opening beneath the gluteal clefts. The patient does not have a bowel regimen but states he has a bowel movement every 2 days this will also provide some problem with regards to the vac seal 07/26/16; the patient never did obtain a [place] wound VAC as he could not afford the $200 per month co- pay we have been using Hydrofera Blue now for 6 weeks or so. No major change in this wound at all. He is still not interested in the concept of plastic surgery. There changing the dressing every second day 08/09/16; the patient arrives with a wound precisely in the same situation. In keeping with the plan I outlined last time extensive debridement with an open curet the surface of this is not completely viable. Still has some degree of surrounding thick skin and subcutaneous tissue. No evidence of infection. Once again I have had a conversation with him about plastic surgery, he is simply not interested. 08/23/16; wound is really no different. Thick circumferential skin and subcutaneous tissue around the wound edge which is a lot better from debridement we did earlier in the  year. The surface of the wound looks viable however with a curet there is definitely a gritty surface to this. We use Medihoney for a while, he could not afford Santyl. I don't think we could get a supply of Iodoflex. He talks a little more positively about the concept of plastic surgery which I've gone over with him today 08/31/16;; patient arrives in clinic today with the wound surface really no different there is no changes in dimensions. I debrided today surface on the left upper side of this wound aggressively week ago there is no real change here no evidence of epithelialization. The problem with debridement in the clinic is that he believes from this very liberally. We have been using Sorbact. 09/21/16; absolutely no change in the appearance or measurements of this wound. More recently I've been debrided in this aggressively and using sorbact to see if we could get to a better wound surface. Although this visually looks satisfactory, debridement reveals a very gritty surface to this. However even with this debridement and removal of thick nonviable skin and subcutaneous tissue from around the large amount of the circumference of this wound we have made absolutely no progress. This may be an offloading issue I'm just not completely certain. It has 2 close proximity in its inferior aspect to consider negative pressure therapy 10/26/16; READMISSION This patient called our clinic yesterday to report an odor in his wound. He had been to see plastic surgery at  [facility] at our request after his last visit on 09/21/16; we have been seeing him for several months with a large stage III wound. He had been sent to general surgery for consideration of a colostomy, that appointment was not until mid June He comes in today with a temperature of 101. He is reporting an odor in the wound since last weekend. 01/10/17 Readmission: 01/10/17 On evaluation today it is noted that patient has been seen by plastic surgery at  [facility] since he was last evaluated here. They did discuss with him the possibility of a flap according to the notes but unfortunately at this point he was not quite ready to proceed with surgery and instead wanted to give the Wound VAC a try. In the hospital they were able to get a good seal on the Wound VAC. Unfortunately since that time they have been having trouble in regard to his current home health company keeping a simple on [place]. ([id number]) the Wound VAC. He would like to switch to a different home health company. With that being said it sounds as if the problem is that his wound VAC is not feeling at the lower portion of his back and he tells me that he can take some of the clear plastic and put over that area when the sill breaks and it will correct it for time. He has no discomfort or pain which is good news. He has been treated with IV vancomycin since he was last seen here and has an appointment with a infectious disease specialist in two days on 01/12/17. Otherwise he was transferred back to [place] for continuing to monitor and manage is wound as she progresses with a Wound VAC for the time being. Patient History Information obtained from Patient. Allergies No Known Allergies Family History Cancer - Mother, Heart Disease - Mother, Seizures - Child, No family history of Diabetes, Hereditary Spherocytosis, Hypertension, Kidney Disease, Lung Disease, Stroke, Thyroid Problems, Tuberculosis. Social History Former smoker, Marital Status - Single, Alcohol Use - Never, Drug Use - No History, Caffeine Use - Moderate - coffee. Medical History Eyes Denies history of Cataracts, Glaucoma, Optic Neuritis Hematologic/Lymphatic Patient has history of Anemia Denies history of Hemophilia, Human Immunodeficiency Virus, Lymphedema, Sickle Cell Disease Respiratory Denies history of Aspiration, Asthma, Chronic Obstructive Pulmonary Disease (COPD), Pneumothorax, Sleep Apnea,  Tuberculosis Cardiovascular Patient has history of Hypertension Gastrointestinal Denies history of Cirrhosis , Colitis, Crohn s, Hepatitis A, Hepatitis B, Hepatitis C Endocrine Denies history of Type I Diabetes, Type II Diabetes Genitourinary Denies history of End Stage Renal Disease Immunological Denies history of Lupus Erythematosus, Raynaud s, Scleroderma Integumentary (Skin) Patient has history of History of pressure wounds Musculoskeletal Patient has history of Rheumatoid Arthritis Neurologic Patient has history of Paraplegia Oncologic IMANI, SHERRIN ([id number]) Denies history of Received Chemotherapy, Received Radiation Psychiatric Denies history of Anorexia/bulimia, Confinement Anxiety Hospitalization/Surgery History - 06/29/2013, [facility], Hemorroids. Medical And Surgical History Notes Genitourinary frequent urination Objective Constitutional Well-nourished and well-hydrated in no acute distress. Vitals Time Taken: 8:16 AM, Height: 75 in, Source: Stated, Weight: 209 lbs, Source: Stated, BMI: 26.1, Temperature: 98.1 F, Pulse: 84 bpm, Respiratory Rate: 16 breaths/min, Blood Pressure: 122/64 mmHg. Respiratory normal breathing without difficulty. clear to auscultation bilaterally. Cardiovascular regular rate and rhythm with normal S1, S2. Psychiatric this patient is able to make decisions and demonstrates good insight into disease process. Alert and Oriented x 3. pleasant and cooperative. General Notes: Patient appears to have a fairly good granular bed in regard  to the sacral wound and though there is no direct bone palpable there is some granulation tissue just overlying the sacrum that is palpable. Integumentary (Hair, Skin) Wound #7 status is Open. Original cause of wound was Pressure Injury. The wound is located on the Midline Sacrum. The wound measures 12.8cm length x 7.1cm width x 5cm depth; 71.377cm^2 area and 356.885cm^3 volume. There is muscle and Fat Layer  (Subcutaneous Tissue) Exposed exposed. There is no tunneling or undermining noted. There is a large amount of serous drainage noted. The wound margin is epibole. There is large (67-100%) red granulation within the wound bed. There is a small (1-33%) amount of necrotic tissue within the wound bed including Adherent Slough. The periwound skin appearance exhibited: Bradley [name], Bradley [name] ([id number]) Maceration. The periwound skin appearance did not exhibit: Callus, Crepitus, Excoriation, Induration, Rash, Scarring, Dry/Scaly, Atrophie Blanche, Cyanosis, Ecchymosis, Hemosiderin Staining, Mottled, Pallor, Rubor, Erythema. Assessment Active Problems ICD-10 L89.154 - Pressure ulcer of sacral region, stage 4 M46.28 - Osteomyelitis of vertebra, sacral and sacrococcygeal region Plan Wound Cleansing: Wound #7 Midline Sacrum: Clean wound with Normal Saline. Cleanse wound with mild soap and water Anesthetic: Wound #7 Midline Sacrum: Topical Lidocaine 4% cream applied to wound bed prior to debridement - for clinic use Skin Barriers/Peri-Wound Care: Wound #7 Midline Sacrum: Skin Prep Primary Wound Dressing: Wound #7 Midline Sacrum: Saline moistened gauze - in office until [facility] can place on the wound vac Secondary Dressing: Wound #7 Midline Sacrum: ABD pad - in office until [facility] can place on the wound vac Dry Gauze - in office until [facility] can place on the wound vac Home Health: Wound #7 Midline Sacrum: [place] for [place] Nurse may visit PRN to address patient s wound care needs. FACE TO FACE ENCOUNTER: MEDICARE and MEDICAID PATIENTS: I certify that this patient is under my care and that I had a face-to-face encounter that meets the physician face-to-face encounter requirements with this patient on this date. The encounter with the patient was in whole or in part for the following MEDICAL CONDITION: (primary reason for [place]) MEDICAL  NECESSITY: I certify, that based on my findings, NURSING services are a medically necessary home health service. HOME BOUND STATUS: I certify that my clinical findings support that this patient is homebound (i.e., Due to illness or injury, pt requires aid of supportive devices such as crutches, cane, wheelchairs, walkers, the use Bradley [name], Bradley A. ([id number]) of special transportation or the assistance of another person to leave their place of residence. There is a normal inability to leave the home and doing so requires considerable and taxing effort. Other absences are for medical reasons / religious services and are infrequent or of short duration when for other reasons). If current dressing causes regression in wound condition, may D/C ordered dressing product/s and apply Normal Saline Moist Dressing daily until next [place] / Other MD appointment. [place] of regression in wound condition at [phone]. Please direct any NON-WOUND related issues/requests for orders to patient's Primary Care Physician Negative Pressure Wound Therapy: Wound #7 Midline Sacrum: Wound VAC settings at 125/130 mmHg continuous pressure. Use BLACK/GREEN foam to wound cavity. Use WHITE foam to fill any tunnel/s and/or undermining. Change VAC dressing 2 X WEEK. Change canister as indicated when full. Nurse may titrate settings and frequency of dressing changes as clinically indicated. Number of foam/gauze pieces used in the dressing = General Notes: At this point per patient request  we are going to attempt a Wound VAC therapy for the next short period of time. I would like to see him for reevaluation in one week just to see were things stand as we have been having trouble with his wound VAC. Patient is in agreement with that plan. We will subsequently see him sooner if there were any complications obviously. With that being said he does want to find a new home health company and we  are going to work on that for him as well again that is patient request. We will see him for reevaluation at that one week time to see were things stand and it is possible that he may not have the Wound VAC in place between now and then just due to the fact that we will be in the midst of switching home help companies. If he has any other concerns in the meantime patient will contact our office otherwise we will monitor and see how his wound progresses over the next several weeks. Electronic Signature(s) Signed: 01/10/2017 5:26:05 PM By: Worthy Keeler PA-C Entered By: Worthy Keeler on 01/10/2017 09:12:51 Bradley [name] ([id number]) -------------------------------------------------------------------------------- ROS/PFSH Details Patient Name: Bradley [name] Date of Service: 01/10/2017 8:00 AM Medical Record Number: [id number] Patient Account Number: [id number] Date of Birth/Sex: [birth year] (71 y.o. Male) Treating RN: Montey Hora Primary Care Provider: [name] Other Clinician: Referring Provider: Referral, Self Treating Provider/Extender: [name], HOYT Weeks in Treatment: 0 Label Progress Note Print Version as History and Physical for this encounter Information Obtained From Patient Wound History Eyes Medical History: Negative for: Cataracts; Glaucoma; Optic Neuritis Hematologic/Lymphatic Medical History: Positive for: Anemia Negative for: Hemophilia; Human Immunodeficiency Virus; Lymphedema; Sickle Cell Disease Respiratory Medical History: Negative for: Aspiration; Asthma; Chronic Obstructive Pulmonary Disease (COPD); Pneumothorax; Sleep Apnea; Tuberculosis Cardiovascular Medical History: Positive for: Hypertension Gastrointestinal Medical History: Negative for: Cirrhosis ; Colitis; Crohnos; Hepatitis A; Hepatitis B; Hepatitis C Endocrine Medical History: Negative for: Type I Diabetes; Type II Diabetes Genitourinary Medical History: Negative for: End  Stage Renal Disease Past Medical History Notes: frequent urination ZAKI, GERTSCH ([id number]) Immunological Medical History: Negative for: Lupus Erythematosus; Raynaudos; Scleroderma Integumentary (Skin) Medical History: Positive for: History of pressure wounds Musculoskeletal Medical History: Positive for: Rheumatoid Arthritis Neurologic Medical History: Positive for: Paraplegia Oncologic Medical History: Negative for: Received Chemotherapy; Received Radiation Psychiatric Medical History: Negative for: Anorexia/bulimia; Confinement Anxiety Immunizations Pneumococcal Vaccine: Received Pneumococcal Vaccination: Yes Immunization Notes: up to date Hospitalization / Surgery History Name of Hospital Purpose of Hospitalization/Surgery Date [place] Hemorroids 06/29/2013 Family and Social History Cancer: Yes - Mother; Diabetes: No; Heart Disease: Yes - Mother; Hereditary Spherocytosis: No; Hypertension: No; Kidney Disease: No; Lung Disease: No; Seizures: Yes - Child; Stroke: No; Thyroid Problems: No; Tuberculosis: No; Former smoker; Marital Status - Single; Alcohol Use: Never; Drug Use: No History; Caffeine Use: Moderate - coffee; Financial Concerns: No; Food, Clothing or Shelter Needs: No; Support System Lacking: No; Transportation Concerns: No; Advanced Directives: No; Patient does not want information on Advanced Directives; Do not resuscitate: No; Living Will: No; Medical Power of Attorney: No Electronic Signature(s) Signed: 01/10/2017 5:26:05 PM By: Linzie Collin ([id number]) Signed: 01/10/2017 5:55:03 PM By: Montey Hora Entered By: Montey Hora on 01/10/2017 07:35:07 Bradley [name] ([id number]) -------------------------------------------------------------------------------- SuperBill Details Patient Name: Bradley Loa A. Date of Service: 01/10/2017 Medical Record Number: [id number] Patient Account Number: [id number] Date of Birth/Sex: 12/21/1945 (71  y.o. Male) Treating RN: Montey Hora Primary Care Provider: [name]  Other Clinician: Referring Provider: Referral, Self Treating Provider/Extender: STONE III, HOYT Weeks in Treatment: 0 Diagnosis Coding ICD-10 Codes Code Description L89.154 Pressure ulcer of sacral region, stage 4 M46.28 Osteomyelitis of vertebra, sacral and sacrococcygeal region Facility Procedures CPT4 Code: 88677373 Description: 66815 - WOUND CARE VISIT-LEV 4 EST PT Modifier: Quantity: 1 Physician Procedures CPT4 Code: 9470761 Description: 51834 - WC PHYS LEVEL 3 - EST PT ICD-10 Description Diagnosis L89.154 Pressure ulcer of sacral region, stage 4 M46.28 Osteomyelitis of vertebra, sacral and sacrococc Modifier: ygeal region Quantity: 1 Electronic Signature(s) Signed: 01/10/2017 5:17:42 PM By: Montey Hora Signed: 01/10/2017 5:26:05 PM By: Worthy Keeler PA-C Entered By: Montey Hora on 01/10/2017 17:17:42

## 2017-01-13 DIAGNOSIS — K592 Neurogenic bowel, not elsewhere classified: Secondary | ICD-10-CM | POA: Diagnosis not present

## 2017-01-13 DIAGNOSIS — D508 Other iron deficiency anemias: Secondary | ICD-10-CM | POA: Diagnosis not present

## 2017-01-13 DIAGNOSIS — S24104S Unspecified injury at T11-T12 level of thoracic spinal cord, sequela: Secondary | ICD-10-CM | POA: Diagnosis not present

## 2017-01-13 DIAGNOSIS — I10 Essential (primary) hypertension: Secondary | ICD-10-CM | POA: Diagnosis not present

## 2017-01-13 DIAGNOSIS — L89154 Pressure ulcer of sacral region, stage 4: Secondary | ICD-10-CM | POA: Diagnosis not present

## 2017-01-13 DIAGNOSIS — N319 Neuromuscular dysfunction of bladder, unspecified: Secondary | ICD-10-CM | POA: Diagnosis not present

## 2017-01-13 DIAGNOSIS — N401 Enlarged prostate with lower urinary tract symptoms: Secondary | ICD-10-CM | POA: Diagnosis not present

## 2017-01-13 DIAGNOSIS — Z433 Encounter for attention to colostomy: Secondary | ICD-10-CM | POA: Diagnosis not present

## 2017-01-13 DIAGNOSIS — G8220 Paraplegia, unspecified: Secondary | ICD-10-CM | POA: Diagnosis not present

## 2017-01-16 DIAGNOSIS — N319 Neuromuscular dysfunction of bladder, unspecified: Secondary | ICD-10-CM | POA: Diagnosis not present

## 2017-01-16 DIAGNOSIS — D508 Other iron deficiency anemias: Secondary | ICD-10-CM | POA: Diagnosis not present

## 2017-01-16 DIAGNOSIS — K592 Neurogenic bowel, not elsewhere classified: Secondary | ICD-10-CM | POA: Diagnosis not present

## 2017-01-16 DIAGNOSIS — I10 Essential (primary) hypertension: Secondary | ICD-10-CM | POA: Diagnosis not present

## 2017-01-16 DIAGNOSIS — Z433 Encounter for attention to colostomy: Secondary | ICD-10-CM | POA: Diagnosis not present

## 2017-01-16 DIAGNOSIS — S24104S Unspecified injury at T11-T12 level of thoracic spinal cord, sequela: Secondary | ICD-10-CM | POA: Diagnosis not present

## 2017-01-16 DIAGNOSIS — G8220 Paraplegia, unspecified: Secondary | ICD-10-CM | POA: Diagnosis not present

## 2017-01-16 DIAGNOSIS — L89154 Pressure ulcer of sacral region, stage 4: Secondary | ICD-10-CM | POA: Diagnosis not present

## 2017-01-16 DIAGNOSIS — N401 Enlarged prostate with lower urinary tract symptoms: Secondary | ICD-10-CM | POA: Diagnosis not present

## 2017-01-17 ENCOUNTER — Encounter: Payer: Medicare HMO | Admitting: Physician Assistant

## 2017-01-17 DIAGNOSIS — Z87891 Personal history of nicotine dependence: Secondary | ICD-10-CM | POA: Diagnosis not present

## 2017-01-17 DIAGNOSIS — M4628 Osteomyelitis of vertebra, sacral and sacrococcygeal region: Secondary | ICD-10-CM | POA: Diagnosis not present

## 2017-01-17 DIAGNOSIS — G8220 Paraplegia, unspecified: Secondary | ICD-10-CM | POA: Diagnosis not present

## 2017-01-17 DIAGNOSIS — I10 Essential (primary) hypertension: Secondary | ICD-10-CM | POA: Diagnosis not present

## 2017-01-17 DIAGNOSIS — M069 Rheumatoid arthritis, unspecified: Secondary | ICD-10-CM | POA: Diagnosis not present

## 2017-01-17 DIAGNOSIS — L89154 Pressure ulcer of sacral region, stage 4: Secondary | ICD-10-CM | POA: Diagnosis not present

## 2017-01-18 DIAGNOSIS — D508 Other iron deficiency anemias: Secondary | ICD-10-CM | POA: Diagnosis not present

## 2017-01-18 DIAGNOSIS — I10 Essential (primary) hypertension: Secondary | ICD-10-CM | POA: Diagnosis not present

## 2017-01-18 DIAGNOSIS — N390 Urinary tract infection, site not specified: Secondary | ICD-10-CM | POA: Diagnosis not present

## 2017-01-18 DIAGNOSIS — M62461 Contracture of muscle, right lower leg: Secondary | ICD-10-CM | POA: Diagnosis not present

## 2017-01-18 DIAGNOSIS — M62462 Contracture of muscle, left lower leg: Secondary | ICD-10-CM | POA: Diagnosis not present

## 2017-01-19 DIAGNOSIS — R159 Full incontinence of feces: Secondary | ICD-10-CM | POA: Diagnosis not present

## 2017-01-19 DIAGNOSIS — R58 Hemorrhage, not elsewhere classified: Secondary | ICD-10-CM | POA: Diagnosis not present

## 2017-01-19 DIAGNOSIS — I10 Essential (primary) hypertension: Secondary | ICD-10-CM | POA: Diagnosis not present

## 2017-01-19 DIAGNOSIS — G8220 Paraplegia, unspecified: Secondary | ICD-10-CM | POA: Diagnosis not present

## 2017-01-19 DIAGNOSIS — S24104S Unspecified injury at T11-T12 level of thoracic spinal cord, sequela: Secondary | ICD-10-CM | POA: Diagnosis not present

## 2017-01-19 DIAGNOSIS — M869 Osteomyelitis, unspecified: Secondary | ICD-10-CM | POA: Diagnosis not present

## 2017-01-19 DIAGNOSIS — Z433 Encounter for attention to colostomy: Secondary | ICD-10-CM | POA: Diagnosis not present

## 2017-01-19 DIAGNOSIS — N319 Neuromuscular dysfunction of bladder, unspecified: Secondary | ICD-10-CM | POA: Diagnosis not present

## 2017-01-19 DIAGNOSIS — N401 Enlarged prostate with lower urinary tract symptoms: Secondary | ICD-10-CM | POA: Diagnosis not present

## 2017-01-19 DIAGNOSIS — K9409 Other complications of colostomy: Secondary | ICD-10-CM | POA: Diagnosis not present

## 2017-01-19 DIAGNOSIS — M4628 Osteomyelitis of vertebra, sacral and sacrococcygeal region: Secondary | ICD-10-CM | POA: Diagnosis not present

## 2017-01-19 DIAGNOSIS — D508 Other iron deficiency anemias: Secondary | ICD-10-CM | POA: Diagnosis not present

## 2017-01-19 DIAGNOSIS — K592 Neurogenic bowel, not elsewhere classified: Secondary | ICD-10-CM | POA: Diagnosis not present

## 2017-01-19 DIAGNOSIS — N390 Urinary tract infection, site not specified: Secondary | ICD-10-CM | POA: Diagnosis not present

## 2017-01-19 DIAGNOSIS — N400 Enlarged prostate without lower urinary tract symptoms: Secondary | ICD-10-CM | POA: Diagnosis not present

## 2017-01-19 DIAGNOSIS — R200 Anesthesia of skin: Secondary | ICD-10-CM | POA: Diagnosis not present

## 2017-01-19 DIAGNOSIS — L089 Local infection of the skin and subcutaneous tissue, unspecified: Secondary | ICD-10-CM | POA: Diagnosis not present

## 2017-01-19 DIAGNOSIS — A419 Sepsis, unspecified organism: Secondary | ICD-10-CM | POA: Diagnosis not present

## 2017-01-19 DIAGNOSIS — L89154 Pressure ulcer of sacral region, stage 4: Secondary | ICD-10-CM | POA: Diagnosis not present

## 2017-01-19 DIAGNOSIS — I878 Other specified disorders of veins: Secondary | ICD-10-CM | POA: Diagnosis not present

## 2017-01-19 NOTE — Progress Notes (Signed)
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 colostomy at one point. In any case he is only been using wet to dry dressings surprisingly changing this himself at home using a mirror. He does not have home health. He does have a level II pressure-relief surface as well as a Roho cushion for his wheelchair. In spite of this the wound is considerably larger one than when he was last in the clinic currently measuring 12.5 x 7. There is also an area superiorly in the wound that tunnels more deeply. Clearly a stage III wound 03/15/16 patient presents today for reevaluation concerning his midline sacral pressure ulcer. This again is an extensive ulcer which does not extend to bone fortunately but is sufficiently large to make healing of this wound difficult. Again he has been seen at [facility] where apparently they did discuss with him the possibility of a diverting colostomy but he did not want any part of that. Subsequently he has not followed up there currently. He continues overall to do fairly well all things considered with this wound. He is currently utilizing Medihoney Santyl would be  extremely expensive for the amount he would need and likely cost prohibitive. 03/29/16; we'll follow this patient on an every two-week basis. He has a fairly substantial stage 3 pressure ulcer over his lower sacrum and coccyx and extending into his bilateral gluteal areas left greater than right. He now has home health[facility]. He is applying Medihoney, kerlix and border foam. He arrives today with the intake nurse reporting a large amount of drainage. The patient stated he put his dressing on it 7:00 this morning by the time he arrived here at 10 there was already a moderate to a large amount of drainage. I once again  reviewed his history. He had an attempted closure with myocutaneous flap earlier this year at [facility]. This did not go well. He was offered a diverting colostomy but refused. He is not a candidate for a wound VAC as the actual wound is precariously close to his anal opening. As mentioned he does have [facility] but miraculously this patient who is a paraplegic is actually changing the dressings himself. 04/12/16 patient presents today for a follow-up of his essentially large sacral pressure ulcer stage III. Nothing has changed dramatically since I last saw him about one month ago. He has seen Dr. Dellia Nims once the interim. With that being said patient's wound appears somewhat less macerated today compared to previous evaluations. He still has no pain being a quadriplegic. 04-26-16 Mr. Bunn returns today for a violation of his stage III sacral pressure ulcer he denies any complaints concerns or issues over the past 2 weeks. He missed to changing dressing twice daily due to drainage although he states this is not an increase in drainage over the past 2 weeks. He does change his dressings independently. He admits to sitting in his motorized chair for no more than 2-3 hours at which time he transfers to bed and rotates lateral position. 05/10/16; Bradley [name]  returns today for review of his stage III sacral pressure ulcer. He denies any concerns over the last 2 weeks although he seems to be running out of Aquacel Ag and on those days he uses Medihoney. He has [facility] was supplying his dressings. He still complains of drainage. He does his dressings independently. He has in his motorized chair for 2-3 hours that time other than that he offloads this. Dimensions of the wound are down 1 cm in both directions. He underwent an aggressive debridement on his last visit of thick circumferential skin and subcutaneous tissue. It is possible at some point in the future he is going to need this done again 05/24/16; the patient returns today for review of his stage III sacral pressure ulcer. We have been using Aquacel Ag he tells me that he changes this up to twice a day. I'm not really certain of the reason for this frequency of changing. He has some involvement from the home health nurses but I think is doing most of the changing himself which I think because of his paraplegia would be a very difficult exercise. Nevertheless he states that there is "wetness". I am not sure if there is another dressing that we could easily changed that much. I'd wanted to change to [facility] but I'll need to have a sense of how frequent he would need to change this. 06/14/16; this is a patient returns for review of his stage III sacral pressure ulcer. We have been using Aquacel Ag and over the last 2 visits he has had extensive debridement so of the thick circumferential skin and subcutaneous tissue that surrounds this wound. In spite of this really absolutely no change in the condition of the wound warrants measurements. We have [facility] home health I believe changing the dressing on Bradley [name], KAESER. ([id number]) 3 occasions the patient states he does this on one occasion himself 06/28/16; this is a patient who has a fairly large stage III sacral pressure ulcer. I  changed him to [facility] from Aquacel 2 weeks ago. He returns today in follow-up. In the meantime a nurse from [facility] has calledrequesting ordering of a wound VAC. He had this discussion before. The problem is  the proximity of the lowest edge of this wound to the patient's anal opening roughly 3/4 of an inch. Can't see how this can be arranged. Apparently the nurse who is calling has a lot of experience, the question would be then when she is not available would be doing this. I would not have thought that this wound is not amenable to a wound VAC because of this reason 07/12/16; the patient comes in today and I have signed orders for a wound VAC. The home health team through [facility] is convinced that he can benefit from this even though there is close proximity to his anal opening beneath the gluteal clefts. The patient does not have a bowel regimen but states he has a bowel movement every 2 days this will also provide some problem with regards to the vac seal 07/26/16; the patient never did obtain a [place] wound VAC as he could not afford the $200 per month co- pay we have been using Hydrofera Blue now for 6 weeks or so. No major change in this wound at all. He is still not interested in the concept of plastic surgery. There changing the dressing every second day 08/09/16; the patient arrives with a wound precisely in the same situation. In keeping with the plan I outlined last time extensive debridement with an open curet the surface of this is not completely viable. Still has some degree of surrounding thick skin and subcutaneous tissue. No evidence of infection. Once again I have had a conversation with him about plastic surgery, he is simply not interested. 08/23/16; wound is really no different. Thick circumferential skin and subcutaneous tissue around the wound edge which is a lot better from debridement we did earlier in the year. The surface of the wound looks  viable however with a curet there is definitely a gritty surface to this. We use Medihoney for a while, he could not afford Santyl. I don't think we could get a supply of Iodoflex. He talks a little more positively about the concept of plastic surgery which I've gone over with him today 08/31/16;; patient arrives in clinic today with the wound surface really no different there is no changes in dimensions. I debrided today surface on the left upper side of this wound aggressively week ago there is no real change here no evidence of epithelialization. The problem with debridement in the clinic is that he believes from this very liberally. We have been using Sorbact. 09/21/16; absolutely no change in the appearance or measurements of this wound. More recently I've been debrided in this aggressively and using sorbact to see if we could get to a better wound surface. Although this visually looks satisfactory, debridement reveals a very gritty surface to this. However even with this debridement and removal of thick nonviable skin and subcutaneous tissue from around the large amount of the circumference of this wound we have made absolutely no progress. This may be an offloading issue I'm just not completely certain. It has 2 close proximity in its inferior aspect to consider negative pressure therapy 10/26/16; READMISSION This patient called our clinic yesterday to report an odor in his wound. He had been to see plastic surgery at [facility] at our request after his last visit on 09/21/16; we have been seeing him for several months with a large stage III wound. He had been sent to general surgery for consideration of a colostomy, that appointment was not until mid June He comes in today with a temperature of 101.  He is reporting an odor in the wound since last weekend. 01/10/17 Readmission: 01/10/17 On evaluation today it is noted that patient has been seen by plastic surgery at [facility] since he was last evaluated  here. They did discuss with him the possibility of a flap according to the notes but unfortunately at this point he was not quite ready to proceed with surgery and instead wanted to give the Wound VAC a try. In the hospital they were able to get a good seal on the Wound VAC. Unfortunately since that time they have been having trouble in regard to his current home health company keeping a simple on [place]. ([id number]) the Wound VAC. He would like to switch to a different home health company. With that being said it sounds as if the problem is that his wound VAC is not feeling at the lower portion of his back and he tells me that he can take some of the clear plastic and put over that area when the sill breaks and it will correct it for time. He has no discomfort or pain which is good news. He has been treated with IV vancomycin since he was last seen here and has an appointment with a infectious disease specialist in two days on 01/12/17. Otherwise he was transferred back to [place] for continuing to monitor and manage is wound as she progresses with a Wound VAC for the time being. 01/17/17 on evaluation today patient continues to show evidence of slight improvement with the Wound VAC fortunately there's no evidence of infection or otherwise worsening condition in general. Nonetheless we were unable to get him switch to [facility] in regard to home help from his current company. I'm not sure the reasoning behind but for some reason he was not accepted as a patient with him. Continue to apply the Wound VAC which does still show that some maceration around the wound edges but the wound measurements were slightly improved. No fevers, chills, nausea, or vomiting noted at this time. Objective Constitutional Well-nourished and well-hydrated in no acute distress. Vitals Time Taken: 9:50 AM, Height: 75 in, Weight: 209 lbs, BMI: 26.1, Temperature: 98.1 F, Pulse: 68 bpm, Respiratory Rate: 16  breaths/min, Blood Pressure: 129/67 mmHg. Respiratory normal breathing without difficulty. clear to auscultation bilaterally. Cardiovascular regular rate and rhythm with normal S1, S2. Psychiatric this patient is able to make decisions and demonstrates good insight into disease process. Alert and Oriented x 3. pleasant and cooperative. General Notes: Patient's wound shows maceration in the periwound but fortunately there is a good wound surface granular nature noted. He has no discomfort. No sharp debridement was required today. Integumentary (Hair, Skin) Wound #7 status is Open. Original cause of wound was Pressure Injury. The wound is located on the Midline Sacrum. The wound measures 12.5cm length x 6.8cm width x 3.6cm depth; 66.759cm^2 area and 240.332cm^3 volume. There is muscle and Fat Layer (Subcutaneous Tissue) Exposed exposed. There is Bradley [name], ROKOSZ. ([id number]) no tunneling or undermining noted. There is a large amount of serosanguineous drainage noted. The wound margin is epibole. There is large (67-100%) red granulation within the wound bed. There is a small (1-33%) amount of necrotic tissue within the wound bed including Eschar and Adherent Slough. The periwound skin appearance exhibited: Maceration. The periwound skin appearance did not exhibit: Callus, Crepitus, Excoriation, Induration, Rash, Scarring, Dry/Scaly, Atrophie Blanche, Cyanosis, Ecchymosis, Hemosiderin Staining, Mottled, Pallor, Rubor, Erythema. 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 Cleanse wound with mild soap and water Anesthetic: Wound #7 Midline Sacrum: Topical Lidocaine 4% cream applied to wound bed prior to debridement - for clinic use Skin Barriers/Peri-Wound Care: Wound #7 Midline Sacrum: Skin  Prep Primary Wound Dressing: Wound #7 Midline Sacrum: Saline moistened gauze - in office until [facility] can place on the wound vac Secondary Dressing: Wound #7 Midline Sacrum: ABD pad - in office until [facility] can place on the wound vac Dry Gauze - in office until [facility] can place on the wound vac Follow-up Appointments: Wound #7 Midline Sacrum: Return Appointment in 2 weeks. Off-Loading: Wound #7 Midline Sacrum: Turn and reposition every 2 hours Additional Orders / Instructions: Wound #7 Midline Sacrum: [place] ([id number]) Increase protein intake. Home Health: Wound #7 Midline Sacrum: [place] Nurse may visit PRN to address patient s wound care needs. FACE TO FACE ENCOUNTER: MEDICARE and MEDICAID PATIENTS: I certify that this patient is under my care and that I had a face-to-face encounter that meets the physician face-to-face encounter requirements with this patient on this date. The encounter with the patient was in whole or in part for the following MEDICAL CONDITION: (primary reason for [place]) MEDICAL NECESSITY: I certify, that based on my findings, NURSING services are a medically necessary home health service. HOME BOUND STATUS: I certify that my clinical findings support that this patient is homebound (i.e., Due to illness or injury, pt requires aid of supportive devices such as crutches, cane, wheelchairs, walkers, the use of special transportation or the assistance of another person to leave their place of residence. There is a normal inability to leave the home and doing so requires considerable and taxing effort. Other absences are for medical reasons / religious services and are infrequent or of short duration when for other reasons). If current dressing causes regression in wound condition, may D/C ordered dressing product/s and apply Normal Saline Moist Dressing daily until next [place] / Other MD appointment.  [place] of regression in wound condition at [phone]. Please direct any NON-WOUND related issues/requests for orders to patient's Primary Care Physician Negative Pressure Wound Therapy: Wound #7 Midline Sacrum: Wound VAC settings at 125/130 mmHg continuous pressure. Use BLACK/GREEN foam to wound cavity. Use WHITE foam to fill any tunnel/s and/or undermining. Change VAC dressing 2 X WEEK. Change canister as indicated when full. Nurse may titrate settings and frequency of dressing changes as clinically indicated. Number of foam/gauze pieces used in the dressing = I'm going to recommend that we continue with the Current wound care measures for the next week. In fact we will see him for reevaluation in two weeks time to see were things stand at that point. If anything worsened significantly in the meantime he will contact our office for additional recommendations. Please see above for specific wound care measures. Electronic Signature(s) Signed: 01/18/2017 1:25:22 AM By: Worthy Keeler PA-C Entered By: Worthy Keeler on 01/17/2017 10:36:12 Bradley [name] ([id number]) -------------------------------------------------------------------------------- SuperBill Details Patient Name: Bradley Loa A. Date of Service: 01/17/2017 Medical Record Number: [id number] Patient Account Number: [id number] Date of Birth/Sex: 02-Nov-1945 (71 y.o. Male) Treating RN: Ahmed Prima Primary Care Provider: [name] Other Clinician: Referring Provider: [name] Treating Provider/Extender: [name], Marny Smethers Weeks in Treatment: 1 Diagnosis Coding ICD-10 Codes Code Description L89.154 Pressure ulcer of sacral region, stage 4  M46.28 Osteomyelitis of vertebra, sacral and sacrococcygeal region Facility Procedures CPT4 Code: [id number] Description: 99214 - WOUND CARE VISIT-LEV 4 EST PT Modifier: Quantity: 1 Physician Procedures CPT4 Code: [id number] Description: 10404 - WC  PHYS LEVEL 3 - EST PT ICD-10 Description Diagnosis L89.154 Pressure ulcer of sacral region, stage 4 M46.28 Osteomyelitis of vertebra, sacral and sacrococc Modifier: ygeal region Quantity: 1 Electronic Signature(s) Signed: 01/17/2017 4:46:49 PM By: Alric Quan Signed: 01/18/2017 1:25:22 AM By: Worthy Keeler PA-C Entered By: Alric Quan on 01/17/2017 13:09:16

## 2017-01-19 NOTE — Progress Notes (Addendum)
KACYN, SOUDER ([id number]) Visit Report for 01/17/2017 Arrival Information Details Patient Name: Bradley [name], Bradley [name]. Date of Service: 01/17/2017 9:45 AM Medical Record Number: [id number] Patient Account Number: [id number] Date of Birth/Sex: [birth year] (71 y.o. Male) Treating RN: Ahmed Prima Primary Care Tanna Loeffler: [name] Other Clinician: Referring Isair Inabinet: [name] Treating Hymen Arnett/Extender: [name], HOYT Weeks in Treatment: 1 Visit Information History Since Last Visit All ordered tests and consults were completed: No Patient Arrived: Wheel Chair Added or deleted any medications: No Arrival Time: 09:48 Any new allergies or adverse reactions: No Accompanied By: self Had a fall or experienced change in No activities of daily living that may affect Transfer Assistance: Harrel Lemon Lift risk of falls: Patient Identification Verified: Yes Signs or symptoms of abuse/neglect since last No Secondary Verification Process Yes visito Completed: Hospitalized since last visit: No Patient Requires Transmission-Based No Has Dressing in Place as Prescribed: Yes Precautions: Pain Present Now: No Patient Has Alerts: No Electronic Signature(s) Signed: 01/17/2017 4:46:49 PM By: Alric Quan Entered By: Alric Quan on 01/17/2017 09:50:41 [name] ([id number]) -------------------------------------------------------------------------------- Clinic Level of Care Assessment Details Patient Name: [name] Date of Service: 01/17/2017 9:45 AM Medical Record Number: [id number] Patient Account Number: [id number] Date of Birth/Sex: 1945/10/06 (71 y.o. Male) Treating RN: Ahmed Prima Primary Care Nobuko Gsell: [name] Other Clinician: Referring Kenli Waldo: [name] Treating Magdalyn Arenivas/Extender: [name], HOYT Weeks in Treatment: 1 Clinic Level of Care Assessment Items TOOL 4 Quantity Score X - Use when only an EandM is performed on FOLLOW-UP  visit 1 0 ASSESSMENTS - Nursing Assessment / Reassessment X - Reassessment of Co-morbidities (includes updates in patient status) 1 10 X - Reassessment of Adherence to Treatment Plan 1 5 ASSESSMENTS - Wound and Skin Assessment / Reassessment X - Simple Wound Assessment / Reassessment - one wound 1 5 []  - Complex Wound Assessment / Reassessment - multiple wounds 0 []  - Dermatologic / Skin Assessment (not related to wound area) 0 ASSESSMENTS - Focused Assessment []  - Circumferential Edema Measurements - multi extremities 0 []  - Nutritional Assessment / Counseling / Intervention 0 []  - Lower Extremity Assessment (monofilament, tuning fork, pulses) 0 []  - Peripheral Arterial Disease Assessment (using hand held doppler) 0 ASSESSMENTS - Ostomy and/or Continence Assessment and Care []  - Incontinence Assessment and Management 0 []  - Ostomy Care Assessment and Management (repouching, etc.) 0 PROCESS - Coordination of Care []  - Simple Patient / Family Education for ongoing care 0 X - Complex (extensive) Patient / Family Education for ongoing care 1 20 X - Staff obtains Programmer, systems, Records, Test Results / Process Orders 1 10 X - Staff telephones HHA, Nursing Homes / Clarify orders / etc 1 10 []  - Routine Transfer to another Facility (non-emergent condition) 0 HULEN, MANDLER ([id number]) []  - Routine Hospital Admission (non-emergent condition) 0 []  - New Admissions / Biomedical engineer / Ordering NPWT, Apligraf, etc. 0 []  - Emergency Hospital Admission (emergent condition) 0 X - Simple Discharge Coordination 1 10 []  - Complex (extensive) Discharge Coordination 0 PROCESS - Special Needs []  - Pediatric / Minor Patient Management 0 []  - Isolation Patient Management 0 []  - Hearing / Language / Visual special needs 0 []  - Assessment of Community assistance (transportation, D/C planning, etc.) 0 []  - Additional assistance / Altered mentation 0 []  - Support Surface(s) Assessment (bed, cushion,  seat, etc.) 0 INTERVENTIONS - Wound Cleansing / Measurement X - Simple Wound Cleansing - one wound 1 5 []  - Complex Wound Cleansing - multiple wounds 0  X - Wound Imaging (photographs - any number of wounds) 1 5 []  - Wound Tracing (instead of photographs) 0 X - Simple Wound Measurement - one wound 1 5 []  - Complex Wound Measurement - multiple wounds 0 INTERVENTIONS - Wound Dressings []  - Small Wound Dressing one or multiple wounds 0 X - Medium Wound Dressing one or multiple wounds 1 15 []  - Large Wound Dressing one or multiple wounds 0 X - Application of Medications - topical 1 5 []  - Application of Medications - injection 0 INTERVENTIONS - Miscellaneous []  - External ear exam 0 RIYAN, HAILE A. ([id number]) []  - Specimen Collection (cultures, biopsies, blood, body fluids, etc.) 0 []  - Specimen(s) / Culture(s) sent or taken to Lab for analysis 0 X - Patient Transfer (multiple staff / Harrel Lemon Lift / Similar devices) 1 10 []  - Simple Staple / Suture removal (25 or less) 0 []  - Complex Staple / Suture removal (26 or more) 0 []  - Hypo / Hyperglycemic Management (close monitor of Blood Glucose) 0 []  - Ankle / Brachial Index (ABI) - do not check if billed separately 0 X - Vital Signs 1 5 Has the patient been seen at the hospital within the last three years: Yes Total Score: 120 Level Of Care: New/Established - Level 4 Electronic Signature(s) Signed: 01/17/2017 4:46:49 PM By: Alric Quan Entered By: Alric Quan on 01/17/2017 13:09:04 [name] ([id number]) -------------------------------------------------------------------------------- Encounter Discharge Information Details Patient Name: [name] Date of Service: 01/17/2017 9:45 AM Medical Record Number: [id number] Patient Account Number: [id number] Date of Birth/Sex: [birth year] (71 y.o. Male) Treating RN: Ahmed Prima Primary Care Alastor Kneale: [name] Other Clinician: Referring Modesty Rudy: [name] Treating Jeter Tomey/Extender: [name], HOYT Weeks in Treatment: 1 Encounter Discharge Information Items Discharge Pain Level: 0 Discharge Condition: Stable Ambulatory Status: Wheelchair Discharge Destination: Home Transportation: Other Accompanied By: self Schedule Follow-up Appointment: Yes Medication Reconciliation completed and provided to Patient/Care No Landan Fedie: Provided on Clinical Summary of Care: 01/17/2017 Form Type Recipient Paper Patient JG Electronic Signature(s) Signed: 01/17/2017 4:51:44 PM By: Montey Hora Entered By: Montey Hora on 01/17/2017 11:05:58 [name] ([id number]) -------------------------------------------------------------------------------- Lower Extremity Assessment Details Patient Name: [name] Date of Service: 01/17/2017 9:45 AM Medical Record Number: [id number] Patient Account Number: [id number] Date of Birth/Sex: May 22, 1946 (71 y.o. Male) Treating RN: Ahmed Prima Primary Care Sanjuana Mruk: [name] Other Clinician: Referring Amari Zagal: [name] Treating Yago Ludvigsen/Extender: [name], HOYT Weeks in Treatment: 1 Electronic Signature(s) Signed: 01/17/2017 4:46:49 PM By: Alric Quan Entered By: Alric Quan on 01/17/2017 10:08:02 [name] ([id number]) -------------------------------------------------------------------------------- Multi Wound Chart Details Patient Name: [name] Date of Service: 01/17/2017 9:45 AM Medical Record Number: [id number] Patient Account Number: [id number] Date of Birth/Sex: 1945-09-08 (71 y.o. Male) Treating RN: Ahmed Prima Primary Care Alvita Fana: [name] Other Clinician: Referring Jasmaine Rochel: [name] Treating Janyia Guion/Extender: [name], HOYT Weeks in Treatment: 1 Vital Signs Height(in): 75 Pulse(bpm): 68 Weight(lbs): 209 Blood Pressure 129/67 (mmHg): Body Mass Index(BMI): 26 Temperature(F): 98.1 Respiratory  Rate 16 (breaths/min): Photos: [7:No Photos] [N/A:N/A] Wound Location: [7:Sacrum - Midline] [N/A:N/A] Wounding Event: [7:Pressure Injury] [N/A:N/A] Primary Etiology: [7:Pressure Ulcer] [N/A:N/A] Comorbid History: [7:Anemia, Hypertension, History of pressure wounds, Rheumatoid Arthritis, Paraplegia] [N/A:N/A] Date Acquired: [7:11/15/2016] [N/A:N/A] Weeks of Treatment: [7:1] [N/A:N/A] Wound Status: [7:Open] [N/A:N/A] Measurements L x W x D 12.5x6.8x3.6 [N/A:N/A] (cm) Area (cm) : [7:66.759] [N/A:N/A] Volume (cm) : [7:240.332] [N/A:N/A] % Reduction in Area: [7:6.50%] [N/A:N/A] % Reduction in Volume: 32.70% [N/A:N/A] Classification: [7:Category/Stage IV] [N/A:N/A] Exudate Amount: [  7:Large] [N/A:N/A] Exudate Type: [7:Serosanguineous] [N/A:N/A] Exudate Color: [7:red, brown] [N/A:N/A] Foul Odor After [7:Yes] [N/A:N/A] Cleansing: Odor Anticipated Due to No [N/A:N/A] Product Use: Wound Margin: [7:Epibole] [N/A:N/A] Granulation Amount: [7:Large (67-100%)] [N/A:N/A] Granulation Quality: [7:Red] [N/A:N/A] Necrotic Amount: [7:Small (1-33%)] [N/A:N/A] Necrotic Tissue: [7:Eschar, Adherent Slough] [N/A:N/A] Exposed Structures: Fat Layer (Subcutaneous N/A N/A Tissue) Exposed: Yes Muscle: Yes Fascia: No Tendon: No Joint: No Bone: No Epithelialization: None N/A N/A Periwound Skin Texture: Excoriation: No N/A N/A Induration: No Callus: No Crepitus: No Rash: No Scarring: No Periwound Skin Maceration: Yes N/A N/A Moisture: Dry/Scaly: No Periwound Skin Color: Atrophie Blanche: No N/A N/A Cyanosis: No Ecchymosis: No Erythema: No Hemosiderin Staining: No Mottled: No Pallor: No Rubor: No Tenderness on No N/A N/A Palpation: Wound Preparation: Ulcer Cleansing: N/A N/A Rinsed/Irrigated with Saline Topical Anesthetic Applied: Other: lidocaine 4% Treatment Notes Electronic Signature(s) Signed: 01/17/2017 4:46:49 PM By: Alric Quan Entered By: Alric Quan on 01/17/2017  10:30:43 [name] ([id number]) -------------------------------------------------------------------------------- [place] Details Patient Name: JESUS, NEVILLS A. Date of Service: 01/17/2017 9:45 AM Medical Record Number: [id number] Patient Account Number: [id number] Date of Birth/Sex: 18-Jan-1946 (71 y.o. Male) Treating RN: Ahmed Prima Primary Care Evalena Fujii: [name] Other Clinician: Referring Jalyne Brodzinski: [name] Treating Merril Isakson/Extender: [name], HOYT Weeks in Treatment: 1 Active Inactive Electronic Signature(s) Signed: 02/14/2017 8:11:33 AM By: Gretta Cool, BSN, RN, CWS, Kim RN, BSN Signed: 02/20/2017 3:39:27 PM By: Alric Quan Previous Signature: 01/17/2017 4:46:49 PM Version By: Alric Quan Entered By: Gretta Cool BSN, RN, CWS, Kim on 02/09/2017 08:56:39 [name] ([id number]) -------------------------------------------------------------------------------- Pain Assessment Details Patient Name: EUSEBIO, BLAZEJEWSKI A. Date of Service: 01/17/2017 9:45 AM Medical Record Number: [id number] Patient Account Number: [id number] Date of Birth/Sex: Sep 12, 1945 (71 y.o. Male) Treating RN: Ahmed Prima Primary Care Marbin Olshefski: [name] Other Clinician: Referring Lakashia Collison: [name] Treating Louise Rawson/Extender: [name], HOYT Weeks in Treatment: 1 Active Problems Location of Pain Severity and Description of Pain Patient Has Paino No Site Locations With Dressing Change: No Pain Management and Medication Current Pain Management: Electronic Signature(s) Signed: 01/17/2017 4:46:49 PM By: Alric Quan Entered By: Alric Quan on 01/17/2017 09:50:52 [name] ([id number]) -------------------------------------------------------------------------------- Patient/Caregiver Education Details Patient Name: [name]. Date of Service: 01/17/2017 9:45 AM Medical Record Number: [id number] Patient Account Number:  [id number] Date of Birth/Gender: [birth year] (71 y.o. Male) Treating RN: Montey Hora Primary Care Physician: [name] Other Clinician: Referring Physician: [name] Treating Physician/Extender: Sharalyn Ink in Treatment: 1 Education Assessment Education Provided To: Patient Education Topics Provided Wound/Skin Impairment: Handouts: Other: change dressing as ordered Methods: Demonstration, Explain/Verbal Responses: State content correctly Electronic Signature(s) Signed: 01/17/2017 4:51:44 PM By: Montey Hora Entered By: Montey Hora on 01/17/2017 11:06:20 [name] ([id number]) -------------------------------------------------------------------------------- Wound Assessment Details Patient Name: [name] Date of Service: 01/17/2017 9:45 AM Medical Record Number: [id number] Patient Account Number: [id number] Date of Birth/Sex: 1945/10/06 (71 y.o. Male) Treating RN: Ahmed Prima Primary Care Yvone Slape: [name] Other Clinician: Referring Elene Downum: [name] Treating Harjot Zavadil/Extender: [name], HOYT Weeks in Treatment: 1 Wound Status Wound Number: 7 Primary Pressure Ulcer Etiology: Wound Location: Midline Sacrum Wound Open Wounding Event: Pressure Injury Status: Date Acquired: 11/15/2016 Comorbid Anemia, Hypertension, History of Weeks Of Treatment: 1 History: pressure wounds, Rheumatoid Arthritis, Clustered Wound: No Paraplegia Photos Photo Uploaded By: Alric Quan on 01/17/2017 16:13:54 Wound Measurements Length: (cm) 12.5 Width: (cm) 6.8 Depth: (cm) 4.3 Area: (cm) 66.759 Volume: (cm) 287.063 % Reduction in Area: 6.5% % Reduction in Volume: 19.6% Epithelialization: None  Tunneling: No Undermining: No Wound Description Classification: Category/Stage IV Wound Margin: Epibole Exudate Amount: Large Exudate Type: Serosanguineous Exudate Color: red, brown Foul Odor After Cleansing:  Yes Due to Product Use: No Slough/Fibrino Yes Wound Bed Granulation Amount: Large (67-100%) Exposed Structure Granulation Quality: Red Fascia Exposed: No Necrotic Amount: Small (1-33%) Fat Layer (Subcutaneous Tissue) Exposed: Yes Necrotic Quality: Eschar, Adherent Slough Tendon Exposed: No GUSTAF, MCCARTER A. ([id number]) Muscle Exposed: Yes Necrosis of Muscle: No Joint Exposed: No Bone Exposed: No Periwound Skin Texture Texture Color No Abnormalities Noted: No No Abnormalities Noted: No Callus: No Atrophie Blanche: No Crepitus: No Cyanosis: No Excoriation: No Ecchymosis: No Induration: No Erythema: No Rash: No Hemosiderin Staining: No Scarring: No Mottled: No Pallor: No Moisture Rubor: No No Abnormalities Noted: No Dry / Scaly: No Maceration: Yes Wound Preparation Ulcer Cleansing: Rinsed/Irrigated with Saline Topical Anesthetic Applied: Other: lidocaine 4%, Electronic Signature(s) Signed: 01/17/2017 4:46:49 PM By: Alric Quan Entered By: Alric Quan on 01/17/2017 10:52:27 [name] ([id number]) -------------------------------------------------------------------------------- Vitals Details Patient Name: [name] Date of Service: 01/17/2017 9:45 AM Medical Record Number: [id number] Patient Account Number: [id number] Date of Birth/Sex: 05-11-1946 (71 y.o. Male) Treating RN: Ahmed Prima Primary Care Michiel Sivley: [name] Other Clinician: Referring Kip Kautzman: [name] Treating Rajendra Spiller/Extender: [name], HOYT Weeks in Treatment: 1 Vital Signs Time Taken: 09:50 Temperature (F): 98.1 Height (in): 75 Pulse (bpm): 68 Weight (lbs): 209 Respiratory Rate (breaths/min): 16 Body Mass Index (BMI): 26.1 Blood Pressure (mmHg): 129/67 Reference Range: 80 - 120 mg / dl Electronic Signature(s) Signed: 01/17/2017 4:46:49 PM By: Alric Quan Entered By: Alric Quan on 01/17/2017 09:51:30

## 2017-01-20 DIAGNOSIS — A419 Sepsis, unspecified organism: Secondary | ICD-10-CM | POA: Diagnosis not present

## 2017-01-20 DIAGNOSIS — L089 Local infection of the skin and subcutaneous tissue, unspecified: Secondary | ICD-10-CM | POA: Diagnosis not present

## 2017-01-20 DIAGNOSIS — M869 Osteomyelitis, unspecified: Secondary | ICD-10-CM | POA: Diagnosis not present

## 2017-01-20 DIAGNOSIS — L89154 Pressure ulcer of sacral region, stage 4: Secondary | ICD-10-CM | POA: Diagnosis not present

## 2017-01-25 DIAGNOSIS — G8220 Paraplegia, unspecified: Secondary | ICD-10-CM | POA: Diagnosis not present

## 2017-01-25 DIAGNOSIS — Q438 Other specified congenital malformations of intestine: Secondary | ICD-10-CM | POA: Diagnosis not present

## 2017-01-25 DIAGNOSIS — Z87891 Personal history of nicotine dependence: Secondary | ICD-10-CM | POA: Diagnosis not present

## 2017-01-25 DIAGNOSIS — M4628 Osteomyelitis of vertebra, sacral and sacrococcygeal region: Secondary | ICD-10-CM | POA: Diagnosis not present

## 2017-01-25 DIAGNOSIS — K9409 Other complications of colostomy: Secondary | ICD-10-CM | POA: Diagnosis not present

## 2017-01-25 DIAGNOSIS — K9419 Other complications of enterostomy: Secondary | ICD-10-CM | POA: Diagnosis not present

## 2017-01-25 DIAGNOSIS — R279 Unspecified lack of coordination: Secondary | ICD-10-CM | POA: Diagnosis not present

## 2017-01-25 DIAGNOSIS — R159 Full incontinence of feces: Secondary | ICD-10-CM | POA: Diagnosis not present

## 2017-01-25 DIAGNOSIS — L89154 Pressure ulcer of sacral region, stage 4: Secondary | ICD-10-CM | POA: Diagnosis not present

## 2017-01-25 DIAGNOSIS — R5381 Other malaise: Secondary | ICD-10-CM | POA: Diagnosis not present

## 2017-01-25 DIAGNOSIS — N400 Enlarged prostate without lower urinary tract symptoms: Secondary | ICD-10-CM | POA: Diagnosis not present

## 2017-01-25 DIAGNOSIS — N319 Neuromuscular dysfunction of bladder, unspecified: Secondary | ICD-10-CM | POA: Diagnosis not present

## 2017-01-25 DIAGNOSIS — M6281 Muscle weakness (generalized): Secondary | ICD-10-CM | POA: Diagnosis not present

## 2017-01-30 ENCOUNTER — Inpatient Hospital Stay: Payer: Medicare HMO | Admitting: Oncology

## 2017-02-01 ENCOUNTER — Ambulatory Visit: Payer: Medicare HMO | Admitting: Internal Medicine

## 2017-02-01 DIAGNOSIS — G8220 Paraplegia, unspecified: Secondary | ICD-10-CM | POA: Diagnosis not present

## 2017-02-01 DIAGNOSIS — N401 Enlarged prostate with lower urinary tract symptoms: Secondary | ICD-10-CM | POA: Diagnosis not present

## 2017-02-01 DIAGNOSIS — L89154 Pressure ulcer of sacral region, stage 4: Secondary | ICD-10-CM | POA: Diagnosis not present

## 2017-02-01 DIAGNOSIS — Z433 Encounter for attention to colostomy: Secondary | ICD-10-CM | POA: Diagnosis not present

## 2017-02-01 DIAGNOSIS — S24104S Unspecified injury at T11-T12 level of thoracic spinal cord, sequela: Secondary | ICD-10-CM | POA: Diagnosis not present

## 2017-02-01 DIAGNOSIS — K592 Neurogenic bowel, not elsewhere classified: Secondary | ICD-10-CM | POA: Diagnosis not present

## 2017-02-01 DIAGNOSIS — D508 Other iron deficiency anemias: Secondary | ICD-10-CM | POA: Diagnosis not present

## 2017-02-01 DIAGNOSIS — I10 Essential (primary) hypertension: Secondary | ICD-10-CM | POA: Diagnosis not present

## 2017-02-01 DIAGNOSIS — N319 Neuromuscular dysfunction of bladder, unspecified: Secondary | ICD-10-CM | POA: Diagnosis not present

## 2017-02-03 DIAGNOSIS — L89154 Pressure ulcer of sacral region, stage 4: Secondary | ICD-10-CM | POA: Diagnosis not present

## 2017-02-03 DIAGNOSIS — M4628 Osteomyelitis of vertebra, sacral and sacrococcygeal region: Secondary | ICD-10-CM | POA: Diagnosis not present

## 2017-02-03 DIAGNOSIS — Z79899 Other long term (current) drug therapy: Secondary | ICD-10-CM | POA: Diagnosis not present

## 2017-02-03 DIAGNOSIS — D508 Other iron deficiency anemias: Secondary | ICD-10-CM | POA: Diagnosis not present

## 2017-02-03 DIAGNOSIS — K592 Neurogenic bowel, not elsewhere classified: Secondary | ICD-10-CM | POA: Diagnosis not present

## 2017-02-03 DIAGNOSIS — I10 Essential (primary) hypertension: Secondary | ICD-10-CM | POA: Diagnosis not present

## 2017-02-03 DIAGNOSIS — N319 Neuromuscular dysfunction of bladder, unspecified: Secondary | ICD-10-CM | POA: Diagnosis not present

## 2017-02-03 DIAGNOSIS — G8220 Paraplegia, unspecified: Secondary | ICD-10-CM | POA: Diagnosis not present

## 2017-02-03 DIAGNOSIS — N401 Enlarged prostate with lower urinary tract symptoms: Secondary | ICD-10-CM | POA: Diagnosis not present

## 2017-02-03 DIAGNOSIS — Z433 Encounter for attention to colostomy: Secondary | ICD-10-CM | POA: Diagnosis not present

## 2017-02-03 DIAGNOSIS — K9409 Other complications of colostomy: Secondary | ICD-10-CM | POA: Diagnosis not present

## 2017-02-03 DIAGNOSIS — N400 Enlarged prostate without lower urinary tract symptoms: Secondary | ICD-10-CM | POA: Diagnosis not present

## 2017-02-03 DIAGNOSIS — S24104S Unspecified injury at T11-T12 level of thoracic spinal cord, sequela: Secondary | ICD-10-CM | POA: Diagnosis not present

## 2017-02-03 DIAGNOSIS — K634 Enteroptosis: Secondary | ICD-10-CM | POA: Diagnosis not present

## 2017-02-03 DIAGNOSIS — Z87891 Personal history of nicotine dependence: Secondary | ICD-10-CM | POA: Diagnosis not present

## 2017-02-06 DIAGNOSIS — L89154 Pressure ulcer of sacral region, stage 4: Secondary | ICD-10-CM | POA: Diagnosis not present

## 2017-02-06 DIAGNOSIS — G8220 Paraplegia, unspecified: Secondary | ICD-10-CM | POA: Diagnosis not present

## 2017-02-06 DIAGNOSIS — N319 Neuromuscular dysfunction of bladder, unspecified: Secondary | ICD-10-CM | POA: Diagnosis not present

## 2017-02-06 DIAGNOSIS — I10 Essential (primary) hypertension: Secondary | ICD-10-CM | POA: Diagnosis not present

## 2017-02-06 DIAGNOSIS — Z433 Encounter for attention to colostomy: Secondary | ICD-10-CM | POA: Diagnosis not present

## 2017-02-06 DIAGNOSIS — S24104S Unspecified injury at T11-T12 level of thoracic spinal cord, sequela: Secondary | ICD-10-CM | POA: Diagnosis not present

## 2017-02-06 DIAGNOSIS — N401 Enlarged prostate with lower urinary tract symptoms: Secondary | ICD-10-CM | POA: Diagnosis not present

## 2017-02-06 DIAGNOSIS — D508 Other iron deficiency anemias: Secondary | ICD-10-CM | POA: Diagnosis not present

## 2017-02-06 DIAGNOSIS — K592 Neurogenic bowel, not elsewhere classified: Secondary | ICD-10-CM | POA: Diagnosis not present

## 2017-02-08 DIAGNOSIS — N401 Enlarged prostate with lower urinary tract symptoms: Secondary | ICD-10-CM | POA: Diagnosis not present

## 2017-02-08 DIAGNOSIS — N319 Neuromuscular dysfunction of bladder, unspecified: Secondary | ICD-10-CM | POA: Diagnosis not present

## 2017-02-08 DIAGNOSIS — S24104S Unspecified injury at T11-T12 level of thoracic spinal cord, sequela: Secondary | ICD-10-CM | POA: Diagnosis not present

## 2017-02-08 DIAGNOSIS — L89154 Pressure ulcer of sacral region, stage 4: Secondary | ICD-10-CM | POA: Diagnosis not present

## 2017-02-08 DIAGNOSIS — K592 Neurogenic bowel, not elsewhere classified: Secondary | ICD-10-CM | POA: Diagnosis not present

## 2017-02-08 DIAGNOSIS — Z433 Encounter for attention to colostomy: Secondary | ICD-10-CM | POA: Diagnosis not present

## 2017-02-08 DIAGNOSIS — D508 Other iron deficiency anemias: Secondary | ICD-10-CM | POA: Diagnosis not present

## 2017-02-08 DIAGNOSIS — I10 Essential (primary) hypertension: Secondary | ICD-10-CM | POA: Diagnosis not present

## 2017-02-08 DIAGNOSIS — G8220 Paraplegia, unspecified: Secondary | ICD-10-CM | POA: Diagnosis not present

## 2017-02-10 DIAGNOSIS — D508 Other iron deficiency anemias: Secondary | ICD-10-CM | POA: Diagnosis not present

## 2017-02-10 DIAGNOSIS — L89154 Pressure ulcer of sacral region, stage 4: Secondary | ICD-10-CM | POA: Diagnosis not present

## 2017-02-10 DIAGNOSIS — N319 Neuromuscular dysfunction of bladder, unspecified: Secondary | ICD-10-CM | POA: Diagnosis not present

## 2017-02-10 DIAGNOSIS — Z433 Encounter for attention to colostomy: Secondary | ICD-10-CM | POA: Diagnosis not present

## 2017-02-10 DIAGNOSIS — N401 Enlarged prostate with lower urinary tract symptoms: Secondary | ICD-10-CM | POA: Diagnosis not present

## 2017-02-10 DIAGNOSIS — K592 Neurogenic bowel, not elsewhere classified: Secondary | ICD-10-CM | POA: Diagnosis not present

## 2017-02-10 DIAGNOSIS — I10 Essential (primary) hypertension: Secondary | ICD-10-CM | POA: Diagnosis not present

## 2017-02-10 DIAGNOSIS — G8220 Paraplegia, unspecified: Secondary | ICD-10-CM | POA: Diagnosis not present

## 2017-02-10 DIAGNOSIS — S24104S Unspecified injury at T11-T12 level of thoracic spinal cord, sequela: Secondary | ICD-10-CM | POA: Diagnosis not present

## 2017-02-13 DIAGNOSIS — L89154 Pressure ulcer of sacral region, stage 4: Secondary | ICD-10-CM | POA: Diagnosis not present

## 2017-02-13 DIAGNOSIS — G8220 Paraplegia, unspecified: Secondary | ICD-10-CM | POA: Diagnosis not present

## 2017-02-13 DIAGNOSIS — N401 Enlarged prostate with lower urinary tract symptoms: Secondary | ICD-10-CM | POA: Diagnosis not present

## 2017-02-13 DIAGNOSIS — I10 Essential (primary) hypertension: Secondary | ICD-10-CM | POA: Diagnosis not present

## 2017-02-13 DIAGNOSIS — K592 Neurogenic bowel, not elsewhere classified: Secondary | ICD-10-CM | POA: Diagnosis not present

## 2017-02-14 ENCOUNTER — Encounter: Payer: Medicare HMO | Attending: Internal Medicine | Admitting: Internal Medicine

## 2017-02-14 DIAGNOSIS — Z87891 Personal history of nicotine dependence: Secondary | ICD-10-CM | POA: Diagnosis not present

## 2017-02-14 DIAGNOSIS — L89154 Pressure ulcer of sacral region, stage 4: Secondary | ICD-10-CM | POA: Insufficient documentation

## 2017-02-14 DIAGNOSIS — G8220 Paraplegia, unspecified: Secondary | ICD-10-CM | POA: Diagnosis not present

## 2017-02-14 DIAGNOSIS — M4628 Osteomyelitis of vertebra, sacral and sacrococcygeal region: Secondary | ICD-10-CM | POA: Insufficient documentation

## 2017-02-14 DIAGNOSIS — M069 Rheumatoid arthritis, unspecified: Secondary | ICD-10-CM | POA: Insufficient documentation

## 2017-02-14 DIAGNOSIS — L89153 Pressure ulcer of sacral region, stage 3: Secondary | ICD-10-CM | POA: Diagnosis not present

## 2017-02-14 DIAGNOSIS — I10 Essential (primary) hypertension: Secondary | ICD-10-CM | POA: Diagnosis not present

## 2017-02-14 DIAGNOSIS — M8668 Other chronic osteomyelitis, other site: Secondary | ICD-10-CM | POA: Diagnosis not present

## 2017-02-15 ENCOUNTER — Inpatient Hospital Stay: Payer: Medicare HMO

## 2017-02-15 ENCOUNTER — Inpatient Hospital Stay: Payer: Medicare HMO | Attending: Oncology | Admitting: Oncology

## 2017-02-15 ENCOUNTER — Encounter: Payer: Self-pay | Admitting: Oncology

## 2017-02-15 VITALS — BP 127/67 | HR 70 | Temp 97.3°F

## 2017-02-15 DIAGNOSIS — R5383 Other fatigue: Secondary | ICD-10-CM | POA: Diagnosis not present

## 2017-02-15 DIAGNOSIS — G8220 Paraplegia, unspecified: Secondary | ICD-10-CM | POA: Diagnosis not present

## 2017-02-15 DIAGNOSIS — N401 Enlarged prostate with lower urinary tract symptoms: Secondary | ICD-10-CM | POA: Diagnosis not present

## 2017-02-15 DIAGNOSIS — Z79899 Other long term (current) drug therapy: Secondary | ICD-10-CM | POA: Insufficient documentation

## 2017-02-15 DIAGNOSIS — R700 Elevated erythrocyte sedimentation rate: Secondary | ICD-10-CM | POA: Insufficient documentation

## 2017-02-15 DIAGNOSIS — Z87891 Personal history of nicotine dependence: Secondary | ICD-10-CM | POA: Diagnosis not present

## 2017-02-15 DIAGNOSIS — D509 Iron deficiency anemia, unspecified: Secondary | ICD-10-CM

## 2017-02-15 DIAGNOSIS — D75839 Thrombocytosis, unspecified: Secondary | ICD-10-CM

## 2017-02-15 DIAGNOSIS — Z933 Colostomy status: Secondary | ICD-10-CM

## 2017-02-15 DIAGNOSIS — D473 Essential (hemorrhagic) thrombocythemia: Secondary | ICD-10-CM

## 2017-02-15 DIAGNOSIS — M1990 Unspecified osteoarthritis, unspecified site: Secondary | ICD-10-CM | POA: Insufficient documentation

## 2017-02-15 LAB — CBC WITH DIFFERENTIAL/PLATELET
Basophils Absolute: 0 10*3/uL (ref 0–0.1)
Basophils Relative: 0 %
Eosinophils Absolute: 0.4 10*3/uL (ref 0–0.7)
Eosinophils Relative: 4 %
HEMATOCRIT: 31 % — AB (ref 40.0–52.0)
HEMOGLOBIN: 10 g/dL — AB (ref 13.0–18.0)
LYMPHS ABS: 2 10*3/uL (ref 1.0–3.6)
LYMPHS PCT: 20 %
MCH: 21.8 pg — AB (ref 26.0–34.0)
MCHC: 32.4 g/dL (ref 32.0–36.0)
MCV: 67.2 fL — AB (ref 80.0–100.0)
MONOS PCT: 9 %
Monocytes Absolute: 0.9 10*3/uL (ref 0.2–1.0)
NEUTROS ABS: 6.7 10*3/uL — AB (ref 1.4–6.5)
NEUTROS PCT: 67 %
Platelets: 552 10*3/uL — ABNORMAL HIGH (ref 150–440)
RBC: 4.61 MIL/uL (ref 4.40–5.90)
RDW: 17.8 % — ABNORMAL HIGH (ref 11.5–14.5)
WBC: 10 10*3/uL (ref 3.8–10.6)

## 2017-02-15 LAB — TSH: TSH: 2.161 u[IU]/mL (ref 0.350–4.500)

## 2017-02-15 LAB — IRON AND TIBC
Iron: 10 ug/dL — ABNORMAL LOW (ref 45–182)
Saturation Ratios: 4 % — ABNORMAL LOW (ref 17.9–39.5)
TIBC: 244 ug/dL — AB (ref 250–450)
UIBC: 234 ug/dL

## 2017-02-15 LAB — COMPREHENSIVE METABOLIC PANEL
ALBUMIN: 2.8 g/dL — AB (ref 3.5–5.0)
ALT: 9 U/L — ABNORMAL LOW (ref 17–63)
AST: 15 U/L (ref 15–41)
Alkaline Phosphatase: 72 U/L (ref 38–126)
Anion gap: 9 (ref 5–15)
BILIRUBIN TOTAL: 0.5 mg/dL (ref 0.3–1.2)
BUN: 8 mg/dL (ref 6–20)
CALCIUM: 8.9 mg/dL (ref 8.9–10.3)
CO2: 27 mmol/L (ref 22–32)
Chloride: 102 mmol/L (ref 101–111)
Creatinine, Ser: 0.52 mg/dL — ABNORMAL LOW (ref 0.61–1.24)
GFR calc Af Amer: >60 mL/min (ref >60)
GLUCOSE: 85 mg/dL (ref 65–99)
Potassium: 3.7 mmol/L (ref 3.5–5.1)
Sodium: 138 mmol/L (ref 135–145)
TOTAL PROTEIN: 7.7 g/dL (ref 6.5–8.1)

## 2017-02-15 LAB — FERRITIN: Ferritin: 103 ng/mL (ref 24–336)

## 2017-02-15 LAB — SEDIMENTATION RATE: SED RATE: 74 mm/h — AB (ref 0–20)

## 2017-02-15 NOTE — Addendum Note (Signed)
Addended by: Earlie Server on: 02/15/2017 04:18 PM   Modules accepted: Orders

## 2017-02-15 NOTE — Progress Notes (Signed)
Hematology/Oncology Consult note [facility] [phone] [phone]  CONSULT NOTE Patient Care Team: [name], MD as PCP - General (Family Medicine)  CHIEF COMPLAINTS/PURPOSE OF CONSULTATION:  I have low blood counts.   HISTORY OF PRESENTING ILLNESS:  Bradley [name] 71 y.o.  male with PMH listed as below who was referred to me for evaluation of iron deficiency anemia.  Patient is paraplegic, also has chronic pressure ulcer, recently just had colostomy. He self catheterizes. Patient feels that his energy levels have been the same. He always has mild fatigue.  He has BPH for which he take finasteride. He has not see his urologist for a while.    ROS:  Review of Systems  Constitutional: Positive for fatigue.  HENT:  Negative.   Eyes: Negative.   Respiratory: Negative.   Cardiovascular: Negative.   Gastrointestinal: Negative.        Colostomy  Endocrine: Negative.   Genitourinary:        Self catherize  Musculoskeletal:       Paraplegic  Skin:       Chronic pressure ulcer  Neurological: Negative.   Hematological: Negative.   Psychiatric/Behavioral: Negative.     MEDICAL HISTORY:  Past Medical History:  Diagnosis Date  . Arthritis   . Paralysis of both lower limbs ([place])     SURGICAL HISTORY: Past Surgical History:  Procedure Laterality Date  . COLONOSCOPY  06/06/2014  . TONSILLECTOMY      SOCIAL HISTORY: Social History   Social History  . Marital status: Married    Spouse name: N/A  . Number of children: N/A  . Years of education: N/A   Occupational History  . Not on file.   Social History Main Topics  . Smoking status: Former Smoker    Packs/day: 0.50    Years: 5.00  . Smokeless tobacco: Never Used  . Alcohol use No  . Drug use: No  . Sexual activity: Not on file   Other Topics Concern  . Not on file   Social History Narrative  . No narrative on file    FAMILY HISTORY: Family History    Problem Relation Age of Onset  . Breast cancer Mother     ALLERGIES:  has No Known Allergies.  MEDICATIONS:  Current Outpatient Prescriptions  Medication Sig Dispense Refill  . amLODipine (NORVASC) 5 MG tablet Take 5 mg by mouth daily.    . baclofen (LIORESAL) 10 MG tablet Take 20 mg by mouth 3 (three) times daily.    . finasteride (PROSCAR) 5 MG tablet Take 5 mg by mouth daily.    Marland Kitchen acetaminophen (TYLENOL) 500 MG tablet Take by mouth.    . CVS ZINC 50 MG TABS Take 50 mg by mouth 2 (two) times daily.    . ferrous sulfate 325 (65 FE) MG tablet Take 325 mg by mouth 2 (two) times daily.    . Multiple Vitamin (MULTI-VITAMINS) TABS Take by mouth.    . polyethylene glycol powder (GLYCOLAX/MIRALAX) powder Take by mouth.    . vitamin C (ASCORBIC ACID) 500 MG tablet Take 500 mg by mouth 2 (two) times daily.     No current facility-administered medications for this visit.       Marland Kitchen  PHYSICAL EXAMINATION: ECOG PERFORMANCE STATUS: 2 - Symptomatic, <50% confined to bed Vitals:   02/15/17 1206  BP: 127/67  Pulse: 70  Temp: (!) 97.3 F (36.3 C)   Filed Weights    GENERAL:Alert, no distress and  comfortable.  EYES: no pallor or icterus OROPHARYNX: no thrush or ulceration; good dentition  NECK: supple, no masses felt LYMPH:  no palpable lymphadenopathy in the cervical, axillary or inguinal regions LUNGS: clear to auscultation and  No wheeze or crackles HEART/CVS: regular rate & rhythm and no murmurs; No lower extremity edema ABDOMEN: abdomen soft, non-tender and normal bowel sounds Musculoskeletal: paraplegic.  PSYCH: alert & oriented x 3  NEURO: paraplegic SKIN:  no rashes or significant lesions  LABORATORY DATA:  I have reviewed the data as listed Lab Results  Component Value Date   WBC 10.0 02/15/2017   HGB 10.0 (L) 02/15/2017   HCT 31.0 (L) 02/15/2017   MCV 67.2 (L) 02/15/2017   PLT 552 (H) 02/15/2017    Recent Labs  02/15/17 1234  NA 138  K 3.7  CL 102  CO2 27   GLUCOSE 85  BUN 8  CREATININE 0.52*  CALCIUM 8.9  GFRNONAA >60  GFRAA >60  PROT 7.7  ALBUMIN 2.8*  AST 15  ALT 9*  ALKPHOS 72  BILITOT 0.5   Iron/TIBC/Ferritin/ %Sat    Component Value Date/Time   IRON 10 (L) 02/15/2017 1234   TIBC 244 (L) 02/15/2017 1234   FERRITIN 103 02/15/2017 1234   IRONPCTSAT 4 (L) 02/15/2017 1234   RADIOGRAPHIC STUDIES: I have personally reviewed the radiological images as listed and agreed with the findings in the report.   ASSESSMENT & PLAN:  1. Microcytic anemia   2. Thrombocytosis (HCC)    His microcytic anemia is likely multifactorial, possibly a combination of iron deficiency and chronic inflammation.  I ordered iron panel today and reviewed the results. Ferritin is 103, low serum iron and low iron saturation, more consistent with anemia of chronic inflammation. Will check soluble transferrin receptor level.  Check monoclonal gammopathy workup, free light chains, TSH.  Will check hemoglobinopathy as well.   All questions were answered. The patient knows to call the clinic with any problems questions or concerns.  Return of visit: 1 week to discuss results.  Thank you for this kind referral and the opportunity to participate in the care of this patient. A copy of today's note is routed to referring provider  Dr.Linthavong Lucianne Muss.   Earlie Server, MD, PhD Hematology Oncology [facility] Pager- [phone] 02/15/2017

## 2017-02-15 NOTE — Progress Notes (Signed)
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 keeping off the graft site. He tells me he had some sort of fall when he went to [facility]. He apparently traumatized the wound and it is really significantly larger today but without evidence of infection. Roughly 2 cm wider and precariously close now to his perianal area and some aspects. 03/02/16; we have not seen this patient in 5 months. He is been followed by plastic surgery at [facility]. The last note from plastic surgery I see was dated 12/15/15. He underwent some form of tissue graft on 09/24/15. This did not the do very well. According the patient is not felt that he could easily undergo additional plastic surgery secondary to the wounds close proximity to the anus. Apparently the patient was offered a diverting colostomy at one point. In any case he is only been using wet to dry dressings surprisingly changing this himself at home using a mirror. He does not have home health. He does have a level II pressure-relief surface as well as a Roho cushion for his wheelchair. In spite of this the wound is considerably  larger one than when he was last in the clinic currently measuring 12.5 x 7. There is also an [place] ([phone]) area superiorly in the wound that tunnels more deeply. Clearly a stage III wound 03/15/16 patient presents today for reevaluation concerning his midline sacral pressure ulcer. This again is an extensive ulcer which does not extend to bone fortunately but is sufficiently large to make healing of this  wound difficult. Again he has been seen at [facility] where apparently they did discuss with him the possibility of a diverting colostomy but he did not want any part of that. Subsequently he has not followed up there currently. He continues overall to do fairly well all things considered with this wound. He is currently utilizing Medihoney Santyl would be extremely expensive for the amount he would need and likely cost prohibitive. 03/29/16; we'll follow this patient on an every two-week basis. He has a fairly substantial stage 3 pressure ulcer over his lower sacrum and coccyx and extending into his bilateral gluteal areas left greater than right. He now has home health. I think [facility]. He is applying Medihoney, kerlix and border foam. He arrives today with the intake nurse reporting a large amount of drainage. The patient stated he put his dressing on it 7:00 this morning by the time he arrived here at 10 there was already a moderate to a large amount of drainage. I once again reviewed his history. He had an attempted closure with myocutaneous flap earlier this year at [facility]. This did not go well. He was offered a diverting colostomy but refused. He is not a candidate for a wound VAC as the actual wound is precariously close to his anal opening. As mentioned he does have [facility] but miraculously this patient who is a paraplegic is actually changing the dressings himself. 04/12/16 patient presents today for a follow-up of his essentially large sacral pressure ulcer  stage III. Nothing has changed dramatically since I last saw him about one month ago. He has seen Bradley [name] once the interim. With that being said patient's wound appears somewhat less macerated today compared to previous evaluations. He still has no pain being a quadriplegic. 04-26-16 Mr. Mcfarland returns today for a violation of his stage III sacral pressure ulcer he denies any complaints concerns or issues over the past 2 weeks. He missed to changing dressing twice daily due to drainage although he states this is not an increase in drainage over the past 2 weeks. He does change his dressings independently. He admits to sitting in his motorized chair for no more than 2-3 hours at which time he transfers to bed and rotates lateral position. 05/10/16; Lenord Fralix returns today for review of his stage III sacral pressure ulcer. He denies any concerns over the last 2 weeks although he seems to be running out of Aquacel Ag and on those days he uses Medihoney. He has [facility] was supplying his dressings. He still complains of drainage. He does his dressings independently. He has in his motorized chair for 2-3 hours that time other than that he offloads this. Dimensions of the wound are down 1 cm in both directions. He underwent an aggressive debridement on his last visit of thick circumferential skin and subcutaneous tissue. It is possible at some point in the future he is going to need this done again 05/24/16; the patient returns today for review of his stage III sacral pressure ulcer. We have been using Aquacel Ag he tells me that he changes this up to twice a day. I'm not really certain of the reason for this frequency of changing. He has some involvement from the home health nurses but I think is doing most of the changing himself which I think because of his paraplegia would be a very difficult exercise. Nevertheless he states that there is "wetness". I am not sure if there is another  dressing that we could easily changed that much. I'd wanted to change to [facility] but I'll need to have a sense of how frequent he would need to change this. 06/14/16; this is a patient returns for review of his stage III sacral pressure ulcer. We have been using Aquacel Ag and over the last 2 visits he has had extensive debridement so of the thick circumferential skin and subcutaneous tissue that surrounds this wound. In spite of this really absolutely no change in the condition of the wound warrants measurements. We have [facility] home health I believe changing the dressing on 3 occasions the patient states he does this on one occasion himself 06/28/16; this is a patient who has a fairly large stage III sacral pressure ulcer. I changed him to [facility] from Aquacel 2 weeks ago. He returns today in follow-up. In the meantime a nurse from [facility] has calledrequesting ordering of a wound VAC. He had this discussion before. The problem is the proximity of the lowest edge of this wound to the patient's anal opening roughly 3/4 of an inch. Can't see Bradley [name], Bradley [name] ([id number]) how this can be arranged. Apparently the nurse who is calling has a lot of experience, the question would be then when she is not available would be doing this. I would not have thought that this wound is not amenable to a wound VAC because of this reason 07/12/16; the patient comes in today and I have signed orders for a wound VAC. The home health team through [facility] is convinced that he can benefit from this even though there is close proximity to his anal opening beneath the gluteal clefts. The patient does not have a bowel regimen but states he has a bowel movement every 2 days this will also provide some problem with regards to the vac seal 07/26/16; the patient never did obtain a [place] wound VAC as he could not afford the $200 per month co- pay we have been using Hydrofera Blue now for 6 weeks or so.  No major change in this wound at all. He is still not interested in the concept of plastic surgery. There changing the dressing every second day 08/09/16; the patient arrives with a wound precisely in the same situation. In keeping with the plan I outlined last time extensive debridement with an open curet the surface of this is not completely viable. Still has some degree of surrounding thick skin and subcutaneous tissue. No evidence of infection. Once again I have had a conversation with him about plastic surgery, he is simply not interested. 08/23/16; wound is really no different. Thick circumferential skin and subcutaneous tissue around the wound edge which is a lot better from debridement we did earlier in the year. The surface of the wound looks viable however with a curet there is definitely a gritty surface to this. We use Medihoney for a while, he could not afford Santyl. I don't think we could get a supply of Iodoflex. He talks a little more positively about the concept of plastic surgery which I've gone over with him today 08/31/16;; patient arrives in clinic today with the wound surface really no different there is no changes in dimensions. I debrided today surface on the left upper side of this wound aggressively week ago there is no real change here no evidence of epithelialization. The problem with debridement in the clinic is that he believes from this very liberally. We have been using Sorbact. 09/21/16; absolutely no change in  the appearance or measurements of this wound. More recently I've been debrided in this aggressively and using sorbact to see if we could get to a better wound surface. Although this visually looks satisfactory, debridement reveals a very gritty surface to this. However even with this debridement and removal of thick nonviable skin and subcutaneous tissue from around the large amount of the circumference of this wound we have made absolutely no progress. This may  be an offloading issue I'm just not completely certain. It has 2 close proximity in its inferior aspect to consider negative pressure therapy 10/26/16; READMISSION This patient called our clinic yesterday to report an odor in his wound. He had been to see plastic surgery at [facility] at our request after his last visit on 09/21/16; we have been seeing him for several months with a large stage III wound. He had been sent to general surgery for consideration of a colostomy, that appointment was not until mid June He comes in today with a temperature of 101. He is reporting an odor in the wound since last weekend. 01/10/17 Readmission: 01/10/17 On evaluation today it is noted that patient has been seen by plastic surgery at [facility] since he was last evaluated here. They did discuss with him the possibility of a flap according to the notes but unfortunately at this point he was not quite ready to proceed with surgery and instead wanted to give the Wound VAC a try. In the hospital they were able to get a good seal on the Wound VAC. Unfortunately since that time they have been having trouble in regard to his current home health company keeping a simple on the Wound VAC. He would like to switch to a different home health company. With that being said it sounds as if the problem is that his wound VAC is not feeling at the lower portion of his back and he tells me that he can take some of the clear plastic and put over that area when the sill breaks and it will correct it for time. He has no discomfort or pain which is good news. He has been treated with IV vancomycin since he was last seen here and has an appointment with a infectious disease specialist in two days on 01/12/17. Otherwise Bradley [name], Bradley [name] ([id number]) he was transferred back to [place] for continuing to monitor and manage is wound as she progresses with a Wound VAC for the time being. 01/17/17 on evaluation today patient continues to show evidence of slight  improvement with the Wound VAC fortunately there's no evidence of infection or otherwise worsening condition in general. Nonetheless we were unable to get him switch to [facility] in regard to home help from his current company. I'm not sure the reasoning behind but for some reason he was not accepted as a patient with him. Continue to apply the Wound VAC which does still show that some maceration around the wound edges but the wound measurements were slightly improved. No fevers, chills, nausea, or vomiting noted at this time. 02/14/17; this patient I have not seen in 5 months although he has been readmitted to our clinic seen by our physician assistant Jeri Cos twice in early August. I have looked through [facility] notes care everywhere. The patient saw plastic surgery in May [Dr. Bhatt}. The patient was sent to general surgery and ultimately had a colostomy placed. On 11/29/16. This was after he was admitted to [facility] sometime in May. An MRI of the pelvis on 5/23  showed osteomyelitis of the coccyx. An attempt was made to drain fluid that was not successful. He was treated with empiric broad-spectrum antibiotics VAC/cefepime/Flagyl starting on 11/02/16 with plans for a 6 week course. According to their notes he was sent to a nursing home. Was last seen by Dr. Myriam Jacobson of plastic surgery on 12/28/16. The first part of her note is a long dissertation about the difficulties finding adequate patients for flap closure of pressure ulcers. At that time the wound was noted to be stage IV based I think on underlying infection no exposed bone and healthy granulation tissue. 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 He was last seen by infectious disease 01/12/17 A Dr. Uvaldo Rising. His note says that Khyron Garno was not interested in a flap closure for referring a trial of the wound VAC. As previously anticipated the wound VAC could not be maintained as an outpatient in the  community. He is now using something similar to a Dakin's wet to dry recommended by [facility] VASHE solution. He is placing this twice a day himself. This is almost hopeless admitting Objective Constitutional Sitting or standing Blood Pressure is within target range for patient.. Pulse regular and within target range for patient.Marland Kitchen Respirations regular, non-labored and within target range.. Temperature is normal and within the target range for the patient.Marland Kitchen appears in no distress. Vitals Time Taken: 9:41 AM, Height: 75 in, Weight: 209 lbs, BMI: 26.1, Temperature: 97.8 F, Pulse: 70 bpm, Respiratory Rate: 16 breaths/min, Blood Pressure: 127/56 mmHg. Respiratory Respiratory effort is easy and symmetric bilaterally. Rate is normal at rest and on room air.. Bilateral breath sounds are clear and equal in all lobes with no wheezes, rales or rhonchi.[name] ([id number]) Cardiovascular Heart rhythm and rate regular, without murmur or gallop.. Gastrointestinal (GI) Colostomy in place. Some herniation of the stoma and some tenderness around this wonder whether there is more widespread herniation. No liver or spleen enlargement or tenderness. Bowel sounds are positive. Psychiatric No evidence of depression, anxiety, or agitation. Calm, cooperative, and communicative. Appropriate interactions and affect.. General Notes: Wound exam; large wound approximating his anal opening at the inferior aspect. What is most concerning today is that he has to probing areas superiorly and this wound that go right down to what I think is his coccyx and/or lower sacrum. This is new since the last time I saw this. There is little tissue separating the base of this wound from underlying bone. There is no overt infection in the soft tissue surrounding this. Integumentary (Hair, Skin) No rashes noted. Wound #7 status is Open. Original cause of wound was Pressure Injury. The wound is located on the Midline Sacrum.  The wound measures 13cm length x 10.1cm width x 3.9cm depth; 103.123cm^2 area and 402.179cm^3 volume. There is muscle and Fat Layer (Subcutaneous Tissue) Exposed exposed. There is no tunneling or undermining noted. There is a large amount of serosanguineous drainage noted. Foul odor after cleansing was noted. The wound margin is epibole. There is large (67-100%) red granulation within the wound bed. There is a small (1-33%) amount of necrotic tissue within the wound bed including Eschar and Adherent Slough. The periwound skin appearance exhibited: Maceration. The periwound skin appearance did not exhibit: Callus, Crepitus, Excoriation, Induration, Rash, Scarring, Dry/Scaly, Atrophie Blanche, Cyanosis, Ecchymosis, Hemosiderin Staining, Mottled, Pallor, Rubor, Erythema. Periwound temperature was noted as No Abnormality. Assessment Active Problems ICD-10 L89.154 - Pressure ulcer of sacral region, stage 4 M46.28 - Osteomyelitis  of vertebra, sacral and sacrococcygeal region Plan [place] ([id number]) Wound Cleansing: Wound #7 Midline Sacrum: Clean wound with Normal Saline. Cleanse wound with mild soap and water Anesthetic: Wound #7 Midline Sacrum: Topical Lidocaine 4% cream applied to wound bed prior to debridement - for clinic use Skin Barriers/Peri-Wound Care: Wound #7 Midline Sacrum: Skin Prep Primary Wound Dressing: Wound #7 Midline Sacrum: Saline moistened gauze Secondary Dressing: Wound #7 Midline Sacrum: ABD pad XtraSorb Dressing Change Frequency: Wound #7 Midline Sacrum: Change dressing every day. Follow-up Appointments: Wound #7 Midline Sacrum: Return Appointment in 2 weeks. Off-Loading: Wound #7 Midline Sacrum: Turn and reposition every 2 hours Additional Orders / Instructions: Wound #7 Midline Sacrum: Increase protein intake. Home Health: Wound #7 Midline Sacrum: Continue Home Health Visits - [place] Nurse may visit PRN to address patient s  wound care needs. FACE TO FACE ENCOUNTER: MEDICARE and MEDICAID PATIENTS: I certify that this patient is under my care and that I had a face-to-face encounter that meets the physician face-to-face encounter requirements with this patient on this date. The encounter with the patient was in whole or in part for the following MEDICAL CONDITION: (primary reason for [place]) MEDICAL NECESSITY: I certify, that based on my findings, NURSING services are a medically necessary home health service. HOME BOUND STATUS: I certify that my clinical findings support that this patient is homebound (i.e., Due to illness or injury, pt requires aid of supportive devices such as crutches, cane, wheelchairs, walkers, the use of special transportation or the assistance of another person to leave their place of residence. There is a normal inability to leave the home and doing so requires considerable and taxing effort. Other absences are for medical reasons / religious services and are infrequent or of short duration when for other reasons). If current dressing causes regression in wound condition, may D/C ordered dressing product/s and apply Normal Saline Moist Dressing daily until next [place] / Other MD appointment. [place] of regression in wound condition at [phone]. Please direct any NON-WOUND related issues/requests for orders to patient's Primary Care Physician Bradley [name], Bradley [name] ([id number]) #1 some deterioration in this since I last saw this in April. I note that he was treated empirically with 3 different antibiotics for underlying osteomyelitis in the coccyx area. He was last seen by infectious disease and no follow-up was arranged. Presumably this was felt to be successful treatment. #2 from my point of view he has had deterioration in this since the last time I saw this probably related to the underlying infection with 2 probing areas probably over the lower  sacrum and coccyx although there is no overt exposed bone this is certainly close #3 I have always thought that this wound was too close to his anal opening to maintain a wound vac seal, and although they were able to maintain this while he was in the hospital at [facility] this was not possible in the community via home health. This was predictable #4 I have again talked to the patient about flap closure however this was before I read the last plastic surgery note from late July by Dr. Myriam Jacobson. The tone of the note was not at all optimistic and while some of this is understandable and certainly the hard medicine of this is accurate, if this was conveyed to the patient I understand now his reluctance to go forward with this. #5 I don't wish to sound overly pessimistic myself but we have made no  progress with this wound and this is deteriorated since last time I saw this. I don't know what role Dakin's wet to dry or VASHE is expected to have other than palliation of this wound area. Perhaps that is felt to be the only option. A wound VAC is not an option in the community #6 the patient states that his original plastic surgeon is now off and he is seeing a Dr. Fransisco Beau but I don't see any reference to a Dr. Fransisco Beau in [place] everywhere. I don't mind phoning plastic surgery to see if there is really an option to proceed with surgery here or not. I'll talk to the patient about this next time as he doesn't seem that he wants to go through with this. If he wants to proceed [and as he points out he did go ahead with the colostomy] I will attempt a phone plastic surgery admitting Electronic Signature(s) Signed: 02/14/2017 3:57:41 PM By: Linton Ham MD Entered By: Linton Ham on 02/14/2017 10:51:47 [name] ([id number]) -------------------------------------------------------------------------------- [place] Details Patient Name: [name] Date of Service: 02/14/2017 Medical Record Number:  [id number] Patient Account Number: [id number] Date of Birth/Sex: Feb 06, 1946 (71 y.o. Male) Treating RN: Bradley [name] Primary Care Provider: Dion [name] Other Clinician: Referring Provider: Dion [name] Treating Provider/Extender: Ricard Dillon Weeks in Treatment: 5 Diagnosis Coding ICD-10 Codes Code Description L89.154 Pressure ulcer of sacral region, stage 4 M46.28 Osteomyelitis of vertebra, sacral and sacrococcygeal region Facility Procedures CPT4 Code: [id number] Description: 99213 - WOUND CARE VISIT-LEV 3 EST PT Modifier: Quantity: 1 Physician Procedures CPT4 Code: [id number] Description: 98921 - WC PHYS LEVEL 4 - EST PT ICD-10 Description Diagnosis L89.154 Pressure ulcer of sacral region, stage 4 M46.28 Osteomyelitis of vertebra, sacral and sacrococc Modifier: ygeal region Quantity: 1 Electronic Signature(s) Signed: 02/14/2017 3:57:41 PM By: Linton Ham MD Signed: 02/14/2017 4:07:38 PM By: Bradley [name] Entered By: Bradley [name] on 02/14/2017 14:51:51

## 2017-02-15 NOTE — Progress Notes (Signed)
Patient here today as a new patient  

## 2017-02-16 LAB — KAPPA/LAMBDA LIGHT CHAINS
Kappa free light chain: 37 mg/L — ABNORMAL HIGH (ref 3.3–19.4)
Kappa, lambda light chain ratio: 0.88 (ref 0.26–1.65)
LAMDA FREE LIGHT CHAINS: 42.1 mg/L — AB (ref 5.7–26.3)

## 2017-02-16 NOTE — Progress Notes (Signed)
ABDIAZIZ, KLAHN ([id number]) Visit Report for 02/14/2017 Arrival Information Details Patient Name: Bradley [name], Bradley [name]. Date of Service: 02/14/2017 9:15 AM Medical Record Number: [id number] Patient Account Number: [id number] Date of Birth/Sex: 1946/06/04 (71 y.o. Male) Treating RN: Montey Hora Primary Care Kalianne Fetting: [name] Other Clinician: Referring Breya Cass: [name] Treating Lavoy Bernards/Extender: Tito Dine in Treatment: 5 Visit Information History Since Last Visit Added or deleted any medications: No Patient Arrived: Wheel Chair Any new allergies or adverse reactions: No Arrival Time: 09:40 Had a fall or experienced change in No activities of daily living that may affect Accompanied By: self risk of falls: Transfer Assistance: Hoyer Lift Signs or symptoms of abuse/neglect since last No Patient Identification Verified: Yes visito Secondary Verification Process Yes Hospitalized since last visit: No Completed: Has Dressing in Place as Prescribed: Yes Patient Requires Transmission-Based No Pain Present Now: No Precautions: Patient Has Alerts: No Electronic Signature(s) Signed: 02/14/2017 4:07:38 PM By: Montey Hora Entered By: Montey Hora on 02/14/2017 09:41:08 Bradley [name] ([id number]) -------------------------------------------------------------------------------- Clinic Level of Care Assessment Details Patient Name: Bradley Loa A. Date of Service: 02/14/2017 9:15 AM Medical Record Number: [id number] Patient Account Number: [id number] Date of Birth/Sex: 1946/03/06 (71 y.o. Male) Treating RN: Montey Hora Primary Care [place] Crance: [name] Other Clinician: Referring Kingstin Heims: [name] Treating Ezma Rehm/Extender: Tito Dine in Treatment: 5 Clinic Level of Care Assessment Items TOOL 4 Quantity Score []  - Use when only an EandM is performed on FOLLOW-UP visit 0 ASSESSMENTS - Nursing Assessment /  Reassessment X - Reassessment of Co-morbidities (includes updates in patient status) 1 10 X - Reassessment of Adherence to Treatment Plan 1 5 ASSESSMENTS - Wound and Skin Assessment / Reassessment X - Simple Wound Assessment / Reassessment - one wound 1 5 []  - Complex Wound Assessment / Reassessment - multiple wounds 0 []  - Dermatologic / Skin Assessment (not related to wound area) 0 ASSESSMENTS - Focused Assessment []  - Circumferential Edema Measurements - multi extremities 0 []  - Nutritional Assessment / Counseling / Intervention 0 []  - Lower Extremity Assessment (monofilament, tuning fork, pulses) 0 []  - Peripheral Arterial Disease Assessment (using hand held doppler) 0 ASSESSMENTS - Ostomy and/or Continence Assessment and Care []  - Incontinence Assessment and Management 0 []  - Ostomy Care Assessment and Management (repouching, etc.) 0 PROCESS - Coordination of Care X - Simple Patient / Family Education for ongoing care 1 15 []  - Complex (extensive) Patient / Family Education for ongoing care 0 []  - Staff obtains Programmer, systems, Records, Test Results / Process Orders 0 []  - Staff telephones HHA, Nursing Homes / Clarify orders / etc 0 []  - Routine Transfer to another Facility (non-emergent condition) 0 Bradley [name], Bradley [name] ([id number]) []  - Routine Hospital Admission (non-emergent condition) 0 []  - New Admissions / Biomedical engineer / Ordering NPWT, Apligraf, etc. 0 []  - Emergency Hospital Admission (emergent condition) 0 X - Simple Discharge Coordination 1 10 []  - Complex (extensive) Discharge Coordination 0 PROCESS - Special Needs []  - Pediatric / Minor Patient Management 0 []  - Isolation Patient Management 0 []  - Hearing / Language / Visual special needs 0 []  - Assessment of Community assistance (transportation, D/C planning, etc.) 0 []  - Additional assistance / Altered mentation 0 []  - Support Surface(s) Assessment (bed, cushion, seat, etc.) 0 INTERVENTIONS - Wound Cleansing /  Measurement X - Simple Wound Cleansing - one wound 1 5 []  - Complex Wound Cleansing - multiple wounds 0 X - Wound Imaging (photographs - any number of wounds) 1  5 []  - Wound Tracing (instead of photographs) 0 X - Simple Wound Measurement - one wound 1 5 []  - Complex Wound Measurement - multiple wounds 0 INTERVENTIONS - Wound Dressings X - Small Wound Dressing one or multiple wounds 1 10 []  - Medium Wound Dressing one or multiple wounds 0 []  - Large Wound Dressing one or multiple wounds 0 []  - Application of Medications - topical 0 []  - Application of Medications - injection 0 INTERVENTIONS - Miscellaneous []  - External ear exam 0 Bradley [name], Bradley A. ([id number]) []  - Specimen Collection (cultures, biopsies, blood, body fluids, etc.) 0 []  - Specimen(s) / Culture(s) sent or taken to Lab for analysis 0 X - Patient Transfer (multiple staff / Harrel Lemon Lift / Similar devices) 1 10 []  - Simple Staple / Suture removal (25 or less) 0 []  - Complex Staple / Suture removal (26 or more) 0 []  - Hypo / Hyperglycemic Management (close monitor of Blood Glucose) 0 []  - Ankle / Brachial Index (ABI) - do not check if billed separately 0 X - Vital Signs 1 5 Has the patient been seen at the hospital within the last three years: Yes Total Score: 85 Level Of Care: New/Established - Level 3 Electronic Signature(s) Signed: 02/14/2017 4:07:38 PM By: Montey Hora Entered By: Montey Hora on 02/14/2017 14:51:42 Bradley [name] ([id number]) -------------------------------------------------------------------------------- Encounter Discharge Information Details Patient Name: Bradley Loa A. Date of Service: 02/14/2017 9:15 AM Medical Record Number: [id number] Patient Account Number: [id number] Date of Birth/Sex: February 28, 1946 (71 y.o. Male) Treating RN: Montey Hora Primary Care Tabia Landowski: [name] Other Clinician: Referring Lorree Millar: [name] Treating Sunni Richardson/Extender: Tito Dine  in Treatment: 5 Encounter Discharge Information Items Discharge Pain Level: 0 Discharge Condition: Stable Ambulatory Status: Wheelchair Discharge Destination: Home Transportation: Private Auto Accompanied By: self Schedule Follow-up Appointment: Yes Medication Reconciliation completed and provided to Patient/Care No Bekka Qian: Provided on Clinical Summary of Care: 02/14/2017 Form Type Recipient Paper Patient JG Electronic Signature(s) Signed: 02/15/2017 9:21:03 AM By: Ruthine Dose Entered By: Ruthine Dose on 02/14/2017 10:23:23 Bradley [name] ([id number]) -------------------------------------------------------------------------------- Multi Wound Chart Details Patient Name: Bradley Loa A. Date of Service: 02/14/2017 9:15 AM Medical Record Number: [id number] Patient Account Number: [id number] Date of Birth/Sex: 1945/09/19 (71 y.o. Male) Treating RN: Montey Hora Primary Care Tully Burgo: [name] Other Clinician: Referring Calan Doren: [name] Treating Allicia Culley/Extender: Ricard Dillon Weeks in Treatment: 5 Vital Signs Height(in): 75 Pulse(bpm): 70 Weight(lbs): 209 Blood Pressure 127/56 (mmHg): Body Mass Index(BMI): 26 Temperature(F): 97.8 Respiratory Rate 16 (breaths/min): Photos: [N/A:N/A] Wound Location: Sacrum - Midline N/A N/A Wounding Event: Pressure Injury N/A N/A Primary Etiology: Pressure Ulcer N/A N/A Comorbid History: Anemia, Hypertension, N/A N/A History of pressure wounds, Rheumatoid Arthritis, Paraplegia Date Acquired: 11/15/2016 N/A N/A Weeks of Treatment: 5 N/A N/A Wound Status: Open N/A N/A Measurements L x W x D 13x10.1x3.9 N/A N/A (cm) Area (cm) : 536.644 N/A N/A Volume (cm) : 402.179 N/A N/A % Reduction in Area: -44.50% N/A N/A % Reduction in Volume: -12.70% N/A N/A Classification: Category/Stage IV N/A N/A Exudate Amount: Large N/A N/A Exudate Type: Serosanguineous N/A N/A Exudate Color: red, brown N/A  N/A Foul Odor After Yes N/A N/A Cleansing: No N/A N/A Bradley [name], Bradley [name] ([id number]) Odor Anticipated Due to Product Use: Wound Margin: Epibole N/A N/A Granulation Amount: Large (67-100%) N/A N/A Granulation Quality: Red N/A N/A Necrotic Amount: Small (1-33%) N/A N/A Necrotic Tissue: Eschar, Adherent Slough N/A N/A Exposed Structures: Fat Layer (Subcutaneous N/A N/A Tissue) Exposed: Yes Muscle: Yes  Fascia: No Tendon: No Joint: No Bone: No Epithelialization: None N/A N/A Periwound Skin Texture: Excoriation: No N/A N/A Induration: No Callus: No Crepitus: No Rash: No Scarring: No Periwound Skin Maceration: Yes N/A N/A Moisture: Dry/Scaly: No Periwound Skin Color: Atrophie Blanche: No N/A N/A Cyanosis: No Ecchymosis: No Erythema: No Hemosiderin Staining: No Mottled: No Pallor: No Rubor: No Temperature: No Abnormality N/A N/A Tenderness on No N/A N/A Palpation: Wound Preparation: Ulcer Cleansing: N/A N/A Rinsed/Irrigated with Saline Topical Anesthetic Applied: Other: lidocaine 4% Treatment Notes Electronic Signature(s) Signed: 02/14/2017 3:57:41 PM By: Linton Ham MD Entered By: Linton Ham on 02/14/2017 10:34:35 Bradley [name] ([id number]) -------------------------------------------------------------------------------- [place] Details Patient Name: Bradley [name], Bradley A. Date of Service: 02/14/2017 9:15 AM Medical Record Number: [id number] Patient Account Number: [id number] Date of Birth/Sex: Nov 29, 1945 (71 y.o. Male) Treating RN: Montey Hora Primary Care Hobert Poplaski: [name] Other Clinician: Referring Geralene Afshar: [name] Treating Taran Haynesworth/Extender: Ricard Dillon Weeks in Treatment: 5 Active Inactive Electronic Signature(s) Signed: 02/14/2017 4:07:38 PM By: Montey Hora Entered By: Montey Hora on 02/14/2017 10:01:49 Bradley [name]  ([id number]) -------------------------------------------------------------------------------- Pain Assessment Details Patient Name: Bradley Loa A. Date of Service: 02/14/2017 9:15 AM Medical Record Number: [id number] Patient Account Number: [id number] Date of Birth/Sex: Aug 14, 1945 (71 y.o. Male) Treating RN: Montey Hora Primary Care [name] Shumard: [name] Other Clinician: Referring Oreoluwa Aigner: [name] Treating Tiaja Hagan/Extender: Ricard Dillon Weeks in Treatment: 5 Active Problems Location of Pain Severity and Description of Pain Patient Has Paino No Site Locations Pain Management and Medication Current Pain Management: Electronic Signature(s) Signed: 02/14/2017 4:07:38 PM By: Montey Hora Entered By: Montey Hora on 02/14/2017 09:41:15 Bradley [name] ([id number]) -------------------------------------------------------------------------------- Patient/Caregiver Education Details Patient Name: Bradley Loa A. Date of Service: 02/14/2017 9:15 AM Medical Record Number: [id number] Patient Account Number: [id number] Date of Birth/Gender: Oct 10, 1945 (71 y.o. Male) Treating RN: Montey Hora Primary Care Physician: [name] Other Clinician: Referring Physician: [name] Treating Physician/Extender: Tito Dine in Treatment: 5 Education Assessment Education Provided To: Patient Education Topics Provided Wound/Skin Impairment: Handouts: Other: wound care as ordered Methods: Explain/Verbal Responses: State content correctly Electronic Signature(s) Signed: 02/14/2017 4:07:38 PM By: Montey Hora Entered By: Montey Hora on 02/14/2017 10:03:02 Bradley [name] ([id number]) -------------------------------------------------------------------------------- Wound Assessment Details Patient Name: Bradley Loa A. Date of Service: 02/14/2017 9:15 AM Medical Record Number: [id number] Patient Account Number: [id number] Date of  Birth/Sex: 12/03/1945 (71 y.o. Male) Treating RN: Montey Hora Primary Care Tommie Bohlken: [name] Other Clinician: Referring Serine Kea: [name] Treating Quanta Roher/Extender: Ricard Dillon Weeks in Treatment: 5 Wound Status Wound Number: 7 Primary Pressure Ulcer Etiology: Wound Location: Sacrum - Midline Wound Open Wounding Event: Pressure Injury Status: Date Acquired: 11/15/2016 Comorbid Anemia, Hypertension, History of Weeks Of Treatment: 5 History: pressure wounds, Rheumatoid Arthritis, Clustered Wound: No Paraplegia Photos Wound Measurements Length: (cm) 13 Width: (cm) 10.1 Depth: (cm) 3.9 Area: (cm) 103.123 Volume: (cm) 402.179 % Reduction in Area: -44.5% % Reduction in Volume: -12.7% Epithelialization: None Tunneling: No Undermining: No Wound Description Classification: Category/Stage IV Wound Margin: Epibole Exudate Amount: Large Exudate Type: Serosanguineous Exudate Color: red, brown Foul Odor After Cleansing: Yes Due to Product Use: No Slough/Fibrino Yes Wound Bed Granulation Amount: Large (67-100%) Exposed Structure Granulation Quality: Red Fascia Exposed: No Necrotic Amount: Small (1-33%) Fat Layer (Subcutaneous Tissue) Exposed: Yes Necrotic Quality: Eschar, Adherent Slough Tendon Exposed: No Muscle Exposed: Yes GRADEN, HOSHINO A. ([id number]) Necrosis of Muscle: No Joint Exposed: No Bone Exposed: No Periwound Skin Texture Texture Color No Abnormalities Noted:  No No Abnormalities Noted: No Callus: No Atrophie Blanche: No Crepitus: No Cyanosis: No Excoriation: No Ecchymosis: No Induration: No Erythema: No Rash: No Hemosiderin Staining: No Scarring: No Mottled: No Pallor: No Moisture Rubor: No No Abnormalities Noted: No Dry / Scaly: No Temperature / Pain Maceration: Yes Temperature: No Abnormality Wound Preparation Ulcer Cleansing: Rinsed/Irrigated with Saline Topical Anesthetic Applied: Other: lidocaine  4%, Treatment Notes Wound #7 (Midline Sacrum) 1. Cleansed with: Clean wound with Normal Saline 2. Anesthetic Topical Lidocaine 4% cream to wound bed prior to debridement 4. Dressing Applied: Saline moistened guaze Other dressing (specify in notes) 5. Secondary Dressing Applied ABD Pad 7. Secured with Tape Notes Manufacturing systems engineer) Signed: 02/14/2017 4:07:38 PM By: Montey Hora Entered By: Montey Hora on 02/14/2017 10:01:36 Bradley [name] ([id number]) -------------------------------------------------------------------------------- [place] Details Patient Name: Bradley Loa A. Date of Service: 02/14/2017 9:15 AM Medical Record Number: [id number] Patient Account Number: [id number] Date of Birth/Sex: 12-31-1945 (71 y.o. Male) Treating RN: Montey Hora Primary Care Evalisse Prajapati: [name] Other Clinician: Referring Lily Velasquez: [name] Treating [place] Every/Extender: Tito Dine in Treatment: 5 Vital Signs Time Taken: 09:41 Temperature (F): 97.8 Height (in): 75 Pulse (bpm): 70 Weight (lbs): 209 Respiratory Rate (breaths/min): 16 Body Mass Index (BMI): 26.1 Blood Pressure (mmHg): 127/56 Reference Range: 80 - 120 mg / dl Electronic Signature(s) Signed: 02/14/2017 4:07:38 PM By: Montey Hora Entered By: Montey Hora on 02/14/2017 09:41:38

## 2017-02-17 LAB — HEMOGLOBINOPATHY EVALUATION
HGB A: 98.5 % (ref 96.4–98.8)
HGB F QUANT: 0 % (ref 0.0–2.0)
HGB S QUANTITAION: 0 %
HGB VARIANT: 0 %
Hgb A2 Quant: 1.5 % — ABNORMAL LOW (ref 1.8–3.2)
Hgb C: 0 %

## 2017-02-19 DIAGNOSIS — G8220 Paraplegia, unspecified: Secondary | ICD-10-CM | POA: Diagnosis not present

## 2017-02-19 DIAGNOSIS — D508 Other iron deficiency anemias: Secondary | ICD-10-CM | POA: Diagnosis not present

## 2017-02-19 DIAGNOSIS — L89154 Pressure ulcer of sacral region, stage 4: Secondary | ICD-10-CM | POA: Diagnosis not present

## 2017-02-19 DIAGNOSIS — S24104S Unspecified injury at T11-T12 level of thoracic spinal cord, sequela: Secondary | ICD-10-CM | POA: Diagnosis not present

## 2017-02-19 DIAGNOSIS — K592 Neurogenic bowel, not elsewhere classified: Secondary | ICD-10-CM | POA: Diagnosis not present

## 2017-02-19 DIAGNOSIS — N401 Enlarged prostate with lower urinary tract symptoms: Secondary | ICD-10-CM | POA: Diagnosis not present

## 2017-02-19 DIAGNOSIS — N319 Neuromuscular dysfunction of bladder, unspecified: Secondary | ICD-10-CM | POA: Diagnosis not present

## 2017-02-19 DIAGNOSIS — I10 Essential (primary) hypertension: Secondary | ICD-10-CM | POA: Diagnosis not present

## 2017-02-19 DIAGNOSIS — Z933 Colostomy status: Secondary | ICD-10-CM | POA: Diagnosis not present

## 2017-02-21 DIAGNOSIS — Z933 Colostomy status: Secondary | ICD-10-CM | POA: Diagnosis not present

## 2017-02-21 DIAGNOSIS — D508 Other iron deficiency anemias: Secondary | ICD-10-CM | POA: Diagnosis not present

## 2017-02-21 DIAGNOSIS — L89154 Pressure ulcer of sacral region, stage 4: Secondary | ICD-10-CM | POA: Diagnosis not present

## 2017-02-21 DIAGNOSIS — N319 Neuromuscular dysfunction of bladder, unspecified: Secondary | ICD-10-CM | POA: Diagnosis not present

## 2017-02-21 DIAGNOSIS — I10 Essential (primary) hypertension: Secondary | ICD-10-CM | POA: Diagnosis not present

## 2017-02-21 DIAGNOSIS — K592 Neurogenic bowel, not elsewhere classified: Secondary | ICD-10-CM | POA: Diagnosis not present

## 2017-02-21 DIAGNOSIS — G8220 Paraplegia, unspecified: Secondary | ICD-10-CM | POA: Diagnosis not present

## 2017-02-21 DIAGNOSIS — S24104S Unspecified injury at T11-T12 level of thoracic spinal cord, sequela: Secondary | ICD-10-CM | POA: Diagnosis not present

## 2017-02-21 DIAGNOSIS — N401 Enlarged prostate with lower urinary tract symptoms: Secondary | ICD-10-CM | POA: Diagnosis not present

## 2017-02-22 ENCOUNTER — Other Ambulatory Visit: Payer: Self-pay

## 2017-02-22 ENCOUNTER — Inpatient Hospital Stay (HOSPITAL_BASED_OUTPATIENT_CLINIC_OR_DEPARTMENT_OTHER): Payer: Medicare HMO | Admitting: Oncology

## 2017-02-22 ENCOUNTER — Inpatient Hospital Stay: Payer: Medicare HMO

## 2017-02-22 VITALS — BP 107/62 | HR 78 | Temp 97.8°F | Resp 18

## 2017-02-22 DIAGNOSIS — M1990 Unspecified osteoarthritis, unspecified site: Secondary | ICD-10-CM | POA: Diagnosis not present

## 2017-02-22 DIAGNOSIS — Z933 Colostomy status: Secondary | ICD-10-CM | POA: Diagnosis not present

## 2017-02-22 DIAGNOSIS — G8220 Paraplegia, unspecified: Secondary | ICD-10-CM | POA: Diagnosis not present

## 2017-02-22 DIAGNOSIS — R700 Elevated erythrocyte sedimentation rate: Secondary | ICD-10-CM | POA: Diagnosis not present

## 2017-02-22 DIAGNOSIS — N401 Enlarged prostate with lower urinary tract symptoms: Secondary | ICD-10-CM | POA: Diagnosis not present

## 2017-02-22 DIAGNOSIS — Z79899 Other long term (current) drug therapy: Secondary | ICD-10-CM

## 2017-02-22 DIAGNOSIS — R5383 Other fatigue: Secondary | ICD-10-CM | POA: Diagnosis not present

## 2017-02-22 DIAGNOSIS — D473 Essential (hemorrhagic) thrombocythemia: Secondary | ICD-10-CM

## 2017-02-22 DIAGNOSIS — D509 Iron deficiency anemia, unspecified: Secondary | ICD-10-CM | POA: Diagnosis not present

## 2017-02-22 DIAGNOSIS — D75839 Thrombocytosis, unspecified: Secondary | ICD-10-CM

## 2017-02-22 DIAGNOSIS — M21949 Unspecified acquired deformity of hand, unspecified hand: Secondary | ICD-10-CM

## 2017-02-22 NOTE — Progress Notes (Signed)
Patient here today for follow up.  Patient states no new concerns today  

## 2017-02-22 NOTE — Progress Notes (Signed)
Hematology/Oncology Follow Up Note Bradley [name] [phone] [phone]  CONSULT NOTE Patient Care Team: [name], MD as PCP - General (Family Medicine)  CHIEF COMPLAINTS/PURPOSE OF CONSULTATION:  I have low blood counts.   HISTORY OF PRESENTING ILLNESS:  Bradley [name] 71 y.o.  male with PMH listed as below who was referred to me for evaluation of iron deficiency anemia.  Patient is paraplegic, also has chronic pressure ulcer, recently just had colostomy. He self catheterizes. Patient feels that his energy levels have been the same. He always has mild fatigue.  He has BPH for which he take finasteride. He has not see his urologist for a while.   INTERVAL HISTORY Patient presents to discuss about the results. No new complaints. His sister accompanied him to this visit.    ROS:  Review of Systems  Constitutional: Positive for fatigue.  HENT:  Negative.   Eyes: Negative.   Respiratory: Negative.   Cardiovascular: Negative.   Gastrointestinal: Negative.        Colostomy  Endocrine: Negative.   Genitourinary:        Self catherize  Musculoskeletal: Positive for arthralgias.       Paraplegic  Skin:       Chronic pressure ulcer  Neurological: Negative.   Hematological: Negative.   Psychiatric/Behavioral: Negative.     MEDICAL HISTORY:  Past Medical History:  Diagnosis Date  . Arthritis   . Paralysis of both lower limbs ([place])     SURGICAL HISTORY: Past Surgical History:  Procedure Laterality Date  . COLONOSCOPY  06/06/2014  . TONSILLECTOMY      SOCIAL HISTORY: Social History   Social History  . Marital status: Married    Spouse name: N/A  . Number of children: N/A  . Years of education: N/A   Occupational History  . Not on file.   Social History Main Topics  . Smoking status: Former Smoker    Packs/day: 0.50    Years: 5.00  . Smokeless tobacco: Never Used  . Alcohol use No  . Drug use: No  . Sexual  activity: Not on file   Other Topics Concern  . Not on file   Social History Narrative  . No narrative on file    FAMILY HISTORY: Family History  Problem Relation Age of Onset  . Breast cancer Mother     ALLERGIES:  has No Known Allergies.  MEDICATIONS:  Current Outpatient Prescriptions  Medication Sig Dispense Refill  . acetaminophen (TYLENOL) 500 MG tablet Take by mouth.    Marland Kitchen amLODipine (NORVASC) 5 MG tablet Take 5 mg by mouth daily.    . baclofen (LIORESAL) 10 MG tablet Take 20 mg by mouth 3 (three) times daily.    . CVS ZINC 50 MG TABS Take 50 mg by mouth 2 (two) times daily.    . ferrous sulfate 325 (65 FE) MG tablet Take 325 mg by mouth 2 (two) times daily.    . finasteride (PROSCAR) 5 MG tablet Take 5 mg by mouth daily.    . Multiple Vitamin (MULTI-VITAMINS) TABS Take by mouth.    . polyethylene glycol powder (GLYCOLAX/MIRALAX) powder Take by mouth.    . vitamin C (ASCORBIC ACID) 500 MG tablet Take 500 mg by mouth 2 (two) times daily.     No current facility-administered medications for this visit.       Marland Kitchen  PHYSICAL EXAMINATION: ECOG PERFORMANCE STATUS: 2 - Symptomatic, <50% confined to bed Vitals:   02/22/17 1423  BP: 107/62  Pulse: 78  Resp: 18  Temp: 97.8 F (36.6 C)   Filed Weights    GENERAL:Alert, no distress and comfortable.  EYES: no pallor or icterus OROPHARYNX: no thrush or ulceration; good dentition  NECK: supple, no masses felt LYMPH:  no palpable lymphadenopathy in the cervical, axillary or inguinal regions LUNGS: clear to auscultation and  No wheeze or crackles HEART/CVS: regular rate & rhythm and no murmurs; No lower extremity edema ABDOMEN: abdomen soft, non-tender and normal bowel sounds Musculoskeletal: paraplegic. Bilateral ulnar deviation of metacarpophalangeal joints and joint tenderness.  PSYCH: alert & oriented x 3  NEURO: paraplegic SKIN:  no rashes or significant lesions  LABORATORY DATA:  I have reviewed the data as  listed Lab Results  Component Value Date   WBC 10.0 02/15/2017   HGB 10.0 (L) 02/15/2017   HCT 31.0 (L) 02/15/2017   MCV 67.2 (L) 02/15/2017   PLT 552 (H) 02/15/2017    Recent Labs  02/15/17 1234  NA 138  K 3.7  CL 102  CO2 27  GLUCOSE 85  BUN 8  CREATININE 0.52*  CALCIUM 8.9  GFRNONAA >60  GFRAA >60  PROT 7.7  ALBUMIN 2.8*  AST 15  ALT 9*  ALKPHOS 72  BILITOT 0.5   Iron/TIBC/Ferritin/ %Sat    Component Value Date/Time   IRON 10 (L) 02/15/2017 1234   TIBC 244 (L) 02/15/2017 1234   FERRITIN 103 02/15/2017 1234   IRONPCTSAT 4 (L) 02/15/2017 1234   RADIOGRAPHIC STUDIES: I have personally reviewed the radiological images as listed and agreed with the findings in the report.   ASSESSMENT & PLAN:  1. Microcytic anemia   2. Thrombocytosis ([place])   3. ESR raised   4. Deformity of hand, unspecified laterality    # His microcytic anemia is likely multifactorial, possibly a combination of iron deficiency and chronic inflammation.  Ferritin is 103, low serum iron and low iron saturation, more consistent with anemia of chronic inflammation, which is consistent with elevated ESR. With further questioning, he admits multiple joints pain, right >left, hand deformity consistent with RA clinically. Will refer him to see [facility].   Microcytosis: hemoglobinopathy evaluation showed possible alpha thalassemia. Ferritin can be falsely elevated due to inflammation status. Will check soluble transferrin receptor level to clarify. Patient has been on OTC iron supplementation which I think is ok to continue. Check SPEP.    Thrombocytosis: can be reactive. Continue monitoring.   All questions were answered. The patient knows to call the clinic with any problems questions or concerns.  Return of visit: 6 weeks for re-evaluation. .  Thank you for this kind referral and the opportunity to participate in the care of this patient. A copy of today's note is routed to referring  provider  Dr.Linthavong Lucianne Muss.   Earlie Server, MD, PhD Hematology Oncology [name] Pager- [phone] 02/22/2017

## 2017-02-23 ENCOUNTER — Encounter: Payer: Self-pay | Admitting: Oncology

## 2017-02-23 LAB — PROTEIN ELECTROPHORESIS, SERUM
A/G RATIO SPE: 0.5 — AB (ref 0.7–1.7)
ALPHA-2-GLOBULIN: 1.3 g/dL — AB (ref 0.4–1.0)
Albumin ELP: 2.4 g/dL — ABNORMAL LOW (ref 2.9–4.4)
Alpha-1-Globulin: 0.5 g/dL — ABNORMAL HIGH (ref 0.0–0.4)
Beta Globulin: 1.4 g/dL — ABNORMAL HIGH (ref 0.7–1.3)
GLOBULIN, TOTAL: 4.5 g/dL — AB (ref 2.2–3.9)
Gamma Globulin: 1.4 g/dL (ref 0.4–1.8)
TOTAL PROTEIN ELP: 6.9 g/dL (ref 6.0–8.5)

## 2017-02-23 LAB — SOLUBLE TRANSFERRIN RECEPTOR: Transferrin Receptor: 27.2 nmol/L (ref 12.2–27.3)

## 2017-02-28 ENCOUNTER — Encounter: Payer: Medicare HMO | Admitting: Internal Medicine

## 2017-02-28 DIAGNOSIS — I10 Essential (primary) hypertension: Secondary | ICD-10-CM | POA: Diagnosis not present

## 2017-02-28 DIAGNOSIS — M069 Rheumatoid arthritis, unspecified: Secondary | ICD-10-CM | POA: Diagnosis not present

## 2017-02-28 DIAGNOSIS — M8668 Other chronic osteomyelitis, other site: Secondary | ICD-10-CM | POA: Diagnosis not present

## 2017-02-28 DIAGNOSIS — M4628 Osteomyelitis of vertebra, sacral and sacrococcygeal region: Secondary | ICD-10-CM | POA: Diagnosis not present

## 2017-02-28 DIAGNOSIS — L89154 Pressure ulcer of sacral region, stage 4: Secondary | ICD-10-CM | POA: Diagnosis not present

## 2017-02-28 DIAGNOSIS — Z87891 Personal history of nicotine dependence: Secondary | ICD-10-CM | POA: Diagnosis not present

## 2017-02-28 DIAGNOSIS — G8220 Paraplegia, unspecified: Secondary | ICD-10-CM | POA: Diagnosis not present

## 2017-03-01 DIAGNOSIS — L89154 Pressure ulcer of sacral region, stage 4: Secondary | ICD-10-CM | POA: Diagnosis not present

## 2017-03-01 DIAGNOSIS — Z933 Colostomy status: Secondary | ICD-10-CM | POA: Diagnosis not present

## 2017-03-01 DIAGNOSIS — D508 Other iron deficiency anemias: Secondary | ICD-10-CM | POA: Diagnosis not present

## 2017-03-01 DIAGNOSIS — N401 Enlarged prostate with lower urinary tract symptoms: Secondary | ICD-10-CM | POA: Diagnosis not present

## 2017-03-01 DIAGNOSIS — I10 Essential (primary) hypertension: Secondary | ICD-10-CM | POA: Diagnosis not present

## 2017-03-01 DIAGNOSIS — K592 Neurogenic bowel, not elsewhere classified: Secondary | ICD-10-CM | POA: Diagnosis not present

## 2017-03-01 DIAGNOSIS — N319 Neuromuscular dysfunction of bladder, unspecified: Secondary | ICD-10-CM | POA: Diagnosis not present

## 2017-03-01 DIAGNOSIS — G8220 Paraplegia, unspecified: Secondary | ICD-10-CM | POA: Diagnosis not present

## 2017-03-01 DIAGNOSIS — S24104S Unspecified injury at T11-T12 level of thoracic spinal cord, sequela: Secondary | ICD-10-CM | POA: Diagnosis not present

## 2017-03-01 NOTE — Progress Notes (Signed)
Bradley [name] ([id number]) Visit Report for 02/28/2017 Arrival Information Details Patient Name: Bradley [name], Bradley [name]. Date of Service: 02/28/2017 10:15 AM Medical Record Number: [id number] Patient Account Number: [id number] Date of Birth/Sex: Dec 31, 1945 (71 y.o. Male) Treating RN: Montey Hora Primary Care Sevyn Paredez: [name] Other Clinician: Referring Shawntelle Ungar: [name] Treating Vladislav Axelson/Extender: Tito Dine in Treatment: 7 Visit Information History Since Last Visit Added or deleted any medications: No Patient Arrived: Wheel Chair Any new allergies or adverse reactions: No Arrival Time: 10:22 Had a fall or experienced change in No activities of daily living that may affect Accompanied By: self risk of falls: Transfer Assistance: Hoyer Lift Signs or symptoms of abuse/neglect since last No Patient Identification Verified: Yes visito Secondary Verification Process Yes Hospitalized since last visit: No Completed: Has Dressing in Place as Prescribed: Yes Patient Requires Transmission-Based No Pain Present Now: No Precautions: Patient Has Alerts: No Electronic Signature(s) Signed: 02/28/2017 3:49:32 PM By: Montey Hora Entered By: Montey Hora on 02/28/2017 10:23:12 [name] ([id number]) -------------------------------------------------------------------------------- Clinic Level of Care Assessment Details Patient Name: Bradley Loa A. Date of Service: 02/28/2017 10:15 AM Medical Record Number: [id number] Patient Account Number: [id number] Date of Birth/Sex: 1946/04/14 (71 y.o. Male) Treating RN: Montey Hora Primary Care Hareem Surowiec: [name] Other Clinician: Referring Milah Recht: [name] Treating Janani Chamber/Extender: Tito Dine in Treatment: 7 Clinic Level of Care Assessment Items TOOL 4 Quantity Score []  - Use when only an EandM is performed on FOLLOW-UP visit 0 ASSESSMENTS - Nursing Assessment /  Reassessment X - Reassessment of Co-morbidities (includes updates in patient status) 1 10 X - Reassessment of Adherence to Treatment Plan 1 5 ASSESSMENTS - Wound and Skin Assessment / Reassessment X - Simple Wound Assessment / Reassessment - one wound 1 5 []  - Complex Wound Assessment / Reassessment - multiple wounds 0 []  - Dermatologic / Skin Assessment (not related to wound area) 0 ASSESSMENTS - Focused Assessment []  - Circumferential Edema Measurements - multi extremities 0 []  - Nutritional Assessment / Counseling / Intervention 0 []  - Lower Extremity Assessment (monofilament, tuning fork, pulses) 0 []  - Peripheral Arterial Disease Assessment (using hand held doppler) 0 ASSESSMENTS - Ostomy and/or Continence Assessment and Care []  - Incontinence Assessment and Management 0 []  - Ostomy Care Assessment and Management (repouching, etc.) 0 PROCESS - Coordination of Care X - Simple Patient / Family Education for ongoing care 1 15 []  - Complex (extensive) Patient / Family Education for ongoing care 0 []  - Staff obtains Programmer, systems, Records, Test Results / Process Orders 0 []  - Staff telephones HHA, Nursing Homes / Clarify orders / etc 0 []  - Routine Transfer to another Facility (non-emergent condition) 0 Bradley [name] ([id number]) []  - Routine Hospital Admission (non-emergent condition) 0 []  - New Admissions / Biomedical engineer / Ordering NPWT, Apligraf, etc. 0 []  - Emergency Hospital Admission (emergent condition) 0 X - Simple Discharge Coordination 1 10 []  - Complex (extensive) Discharge Coordination 0 PROCESS - Special Needs []  - Pediatric / Minor Patient Management 0 []  - Isolation Patient Management 0 []  - Hearing / Language / Visual special needs 0 []  - Assessment of Community assistance (transportation, D/C planning, etc.) 0 []  - Additional assistance / Altered mentation 0 []  - Support Surface(s) Assessment (bed, cushion, seat, etc.) 0 INTERVENTIONS - Wound Cleansing /  Measurement X - Simple Wound Cleansing - one wound 1 5 []  - Complex Wound Cleansing - multiple wounds 0 X - Wound Imaging (photographs - any number of wounds) 1  5 []  - Wound Tracing (instead of photographs) 0 X - Simple Wound Measurement - one wound 1 5 []  - Complex Wound Measurement - multiple wounds 0 INTERVENTIONS - Wound Dressings []  - Small Wound Dressing one or multiple wounds 0 X - Medium Wound Dressing one or multiple wounds 1 15 []  - Large Wound Dressing one or multiple wounds 0 []  - Application of Medications - topical 0 []  - Application of Medications - injection 0 INTERVENTIONS - Miscellaneous []  - External ear exam 0 [name], [name] ([id number]) []  - Specimen Collection (cultures, biopsies, blood, body fluids, etc.) 0 []  - Specimen(s) / Culture(s) sent or taken to Lab for analysis 0 X - Patient Transfer (multiple staff / Harrel Lemon Lift / Similar devices) 1 10 []  - Simple Staple / Suture removal (25 or less) 0 []  - Complex Staple / Suture removal (26 or more) 0 []  - Hypo / Hyperglycemic Management (close monitor of Blood Glucose) 0 []  - Ankle / Brachial Index (ABI) - do not check if billed separately 0 X - Vital Signs 1 5 Has the patient been seen at the hospital within the last three years: Yes Total Score: 90 Level Of Care: New/Established - Level 3 Electronic Signature(s) Signed: 02/28/2017 3:49:32 PM By: Montey Hora Entered By: Montey Hora on 02/28/2017 14:06:09 [name] ([id number]) -------------------------------------------------------------------------------- Encounter Discharge Information Details Patient Name: Bradley Loa A. Date of Service: 02/28/2017 10:15 AM Medical Record Number: [id number] Patient Account Number: [id number] Date of Birth/Sex: 1945/09/29 (71 y.o. Male) Treating RN: Montey Hora Primary Care Affan Callow: [name] Other Clinician: Referring Malakie Balis: [name] Treating Elzora Cullins/Extender: Tito Dine in Treatment: 7 Encounter Discharge Information Items Discharge Pain Level: 0 Discharge Condition: Stable Ambulatory Status: Wheelchair Discharge Destination: Home Transportation: Private Auto Accompanied By: self Schedule Follow-up Appointment: Yes Medication Reconciliation completed and provided to Patient/Care No Aseem Sessums: Provided on Clinical Summary of Care: 02/28/2017 Form Type Recipient Paper Patient JG Electronic Signature(s) Signed: 02/28/2017 4:21:28 PM By: Ruthine Dose Entered By: Ruthine Dose on 02/28/2017 11:05:21 [name] ([id number]) -------------------------------------------------------------------------------- Multi Wound Chart Details Patient Name: Bradley Loa A. Date of Service: 02/28/2017 10:15 AM Medical Record Number: [id number] Patient Account Number: [id number] Date of Birth/Sex: 1946/03/16 (71 y.o. Male) Treating RN: Montey Hora Primary Care Harshil Cavallaro: [name] Other Clinician: Referring Leroy Pettway: [name] Treating Ayriana Wix/Extender: Ricard Dillon Weeks in Treatment: 7 Vital Signs Height(in): 75 Pulse(bpm): 76 Weight(lbs): 209 Blood Pressure 111/72 (mmHg): Body Mass Index(BMI): 26 Temperature(F): 97.9 Respiratory Rate 16 (breaths/min): Photos: [7:No Photos] [N/A:N/A] Wound Location: [7:Sacrum - Midline] [N/A:N/A] Wounding Event: [7:Pressure Injury] [N/A:N/A] Primary Etiology: [7:Pressure Ulcer] [N/A:N/A] Comorbid History: [7:Anemia, Hypertension, History of pressure wounds, Rheumatoid Arthritis, Paraplegia] [N/A:N/A] Date Acquired: [7:11/15/2016] [N/A:N/A] Weeks of Treatment: [7:7] [N/A:N/A] Wound Status: [7:Open] [N/A:N/A] Measurements L x W x D 13.5x11x4.2 [N/A:N/A] (cm) Area (cm) : [9:983.382] [N/A:N/A] Volume (cm) : [5:053.976] [N/A:N/A] % Reduction in Area: [7:-63.40%] [N/A:N/A] % Reduction in Volume: -37.30% [N/A:N/A] Classification: [7:Category/Stage IV] [N/A:N/A] Exudate Amount:  [7:Large] [N/A:N/A] Exudate Type: [7:Serosanguineous] [N/A:N/A] Exudate Color: [7:red, brown] [N/A:N/A] Foul Odor After [7:Yes] [N/A:N/A] Cleansing: Odor Anticipated Due to No [N/A:N/A] Product Use: Wound Margin: [7:Epibole] [N/A:N/A] Granulation Amount: [7:Large (67-100%)] [N/A:N/A] Granulation Quality: [7:Red] [N/A:N/A] Necrotic Amount: [7:Small (1-33%)] [N/A:N/A] Necrotic Tissue: [7:Eschar, Adherent Slough] [N/A:N/A] Exposed Structures: Fat Layer (Subcutaneous N/A N/A Tissue) Exposed: Yes Muscle: Yes Fascia: No Tendon: No Joint: No Bone: No Epithelialization: None N/A N/A Periwound Skin Texture: Excoriation: No N/A N/A Induration: No Callus: No Crepitus: No  Rash: No Scarring: No Periwound Skin Maceration: Yes N/A N/A Moisture: Dry/Scaly: No Periwound Skin Color: Atrophie Blanche: No N/A N/A Cyanosis: No Ecchymosis: No Erythema: No Hemosiderin Staining: No Mottled: No Pallor: No Rubor: No Temperature: No Abnormality N/A N/A Tenderness on No N/A N/A Palpation: Wound Preparation: Ulcer Cleansing: N/A N/A Rinsed/Irrigated with Saline Topical Anesthetic Applied: Other: lidocaine 4% Treatment Notes Electronic Signature(s) Signed: 02/28/2017 4:14:11 PM By: Linton Ham MD Entered By: Linton Ham on 02/28/2017 11:43:02 [name] ([id number]) -------------------------------------------------------------------------------- [place] Details Patient Name: RODRICUS, CANDELARIA A. Date of Service: 02/28/2017 10:15 AM Medical Record Number: [id number] Patient Account Number: [id number] Date of Birth/Sex: August 22, 1945 (71 y.o. Male) Treating RN: Montey Hora Primary Care Janesa Dockery: [name] Other Clinician: Referring Dakoda Bassette: [name] Treating Alexx Mcburney/Extender: Ricard Dillon Weeks in Treatment: 7 Active Inactive Electronic Signature(s) Signed: 02/28/2017 3:49:32 PM By: Montey Hora Entered By: Montey Hora on  02/28/2017 10:42:55 [name] ([id number]) -------------------------------------------------------------------------------- Pain Assessment Details Patient Name: Bradley Loa A. Date of Service: 02/28/2017 10:15 AM Medical Record Number: [id number] Patient Account Number: [id number] Date of Birth/Sex: [birth year] (71 y.o. Male) Treating RN: Montey Hora Primary Care Sanyiah Kanzler: [name] Other Clinician: Referring Dwain Huhn: [name] Treating Wilmont Olund/Extender: Ricard Dillon Weeks in Treatment: 7 Active Problems Location of Pain Severity and Description of Pain Patient Has Paino No Site Locations Pain Management and Medication Current Pain Management: Electronic Signature(s) Signed: 02/28/2017 3:49:32 PM By: Montey Hora Entered By: Montey Hora on 02/28/2017 10:23:20 [name] ([id number]) -------------------------------------------------------------------------------- Patient/Caregiver Education Details Patient Name: Bradley Loa A. Date of Service: 02/28/2017 10:15 AM Medical Record Number: [id number] Patient Account Number: [id number] Date of Birth/Gender: 06-13-1945 (71 y.o. Male) Treating RN: Montey Hora Primary Care Physician: [name] Other Clinician: Referring Physician: [name] Treating Physician/Extender: Tito Dine in Treatment: 7 Education Assessment Education Provided To: Patient Education Topics Provided Wound/Skin Impairment: Handouts: Other: wound care as ordered Methods: Explain/Verbal Responses: State content correctly Electronic Signature(s) Signed: 02/28/2017 3:49:32 PM By: Montey Hora Entered By: Montey Hora on 02/28/2017 10:43:51 [name] ([id number]) -------------------------------------------------------------------------------- Wound Assessment Details Patient Name: Bradley Loa A. Date of Service: 02/28/2017 10:15 AM Medical Record Number: [id number] Patient  Account Number: [id number] Date of Birth/Sex: May 18, 1946 (72 y.o. Male) Treating RN: Montey Hora Primary Care Karli Wickizer: [name] Other Clinician: Referring Charmelle Soh: [name] Treating Izella Ybanez/Extender: Ricard Dillon Weeks in Treatment: 7 Wound Status Wound Number: 7 Primary Pressure Ulcer Etiology: Wound Location: Sacrum - Midline Wound Open Wounding Event: Pressure Injury Status: Date Acquired: 11/15/2016 Comorbid Anemia, Hypertension, History of Weeks Of Treatment: 7 History: pressure wounds, Rheumatoid Arthritis, Clustered Wound: No Paraplegia Photos Photo Uploaded By: Montey Hora on 02/28/2017 12:49:01 Wound Measurements Length: (cm) 13.5 Width: (cm) 11 Depth: (cm) 4.2 Area: (cm) 116.632 Volume: (cm) 489.853 % Reduction in Area: -63.4% % Reduction in Volume: -37.3% Epithelialization: None Tunneling: No Undermining: No Wound Description Classification: Category/Stage IV Wound Margin: Epibole Exudate Amount: Large Exudate Type: Serosanguineous Exudate Color: red, brown Foul Odor After Cleansing: Yes Due to Product Use: No Slough/Fibrino Yes Wound Bed Granulation Amount: Large (67-100%) Exposed Structure Granulation Quality: Red Fascia Exposed: No Necrotic Amount: Small (1-33%) Fat Layer (Subcutaneous Tissue) Exposed: Yes Necrotic Quality: Eschar, Adherent Slough Tendon Exposed: No JUEL, BELLEROSE A. ([id number]) Muscle Exposed: Yes Necrosis of Muscle: No Joint Exposed: No Bone Exposed: No Periwound Skin Texture Texture Color No Abnormalities Noted: No No Abnormalities Noted: No Callus: No Atrophie Blanche: No Crepitus: No Cyanosis: No Excoriation: No Ecchymosis:  No Induration: No Erythema: No Rash: No Hemosiderin Staining: No Scarring: No Mottled: No Pallor: No Moisture Rubor: No No Abnormalities Noted: No Dry / Scaly: No Temperature / Pain Maceration: Yes Temperature: No Abnormality Wound Preparation Ulcer  Cleansing: Rinsed/Irrigated with Saline Topical Anesthetic Applied: Other: lidocaine 4%, Treatment Notes Wound #7 (Midline Sacrum) 1. Cleansed with: Clean wound with Normal Saline 4. Dressing Applied: Saline moistened guaze 5. Secondary Dressing Applied ABD Pad 7. Secured with Tape Notes Manufacturing systems engineer) Signed: 02/28/2017 3:49:32 PM By: Montey Hora Entered By: Montey Hora on 02/28/2017 10:42:39 [name] ([id number]) -------------------------------------------------------------------------------- [place] Details Patient Name: Bradley Loa A. Date of Service: 02/28/2017 10:15 AM Medical Record Number: [id number] Patient Account Number: [id number] Date of Birth/Sex: 1945/07/26 (71 y.o. Male) Treating RN: Montey Hora Primary Care Starlette Thurow: [name] Other Clinician: Referring Tristin Gladman: [name] Treating Nancy Manuele/Extender: Tito Dine in Treatment: 7 Vital Signs Time Taken: 10:28 Temperature (F): 97.9 Height (in): 75 Pulse (bpm): 76 Weight (lbs): 209 Respiratory Rate (breaths/min): 16 Body Mass Index (BMI): 26.1 Blood Pressure (mmHg): 111/72 Reference Range: 80 - 120 mg / dl Electronic Signature(s) Signed: 02/28/2017 3:49:32 PM By: Montey Hora Entered By: Montey Hora on 02/28/2017 10:29:25

## 2017-03-01 NOTE — Progress Notes (Signed)
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 [facility]. He apparently traumatized the wound and it is really significantly larger today but without evidence of infection. Roughly 2 cm wider and precariously close now to his perianal area and some aspects. 03/02/16; we have not seen this patient in 5 months. He is been followed by plastic surgery at [facility]. The last note from plastic surgery I see was dated 12/15/15. He underwent some form of tissue graft on 09/24/15. This did not the do very well. According the patient is not felt that he could easily undergo additional plastic surgery secondary to the wounds close proximity to the anus. Apparently the patient was offered a diverting colostomy at one point. In any case he is only been using wet to dry dressings surprisingly changing this himself at home using a mirror. He does not have home health. He does have a level II pressure-relief surface  as well as a Roho cushion for his wheelchair. In spite of this the wound is considerably larger one than when he was last in the clinic currently measuring 12.5 x 7. There is also an [place] ([phone]) area superiorly in the wound that tunnels more deeply. Clearly a stage III wound 03/15/16 patient presents  today for reevaluation concerning his midline sacral pressure ulcer. This again is an extensive ulcer which does not extend to bone fortunately but is sufficiently large to make healing of this wound difficult. Again he has been seen at [facility] where apparently they did discuss with him the possibility of a diverting colostomy but he did not want any part of that. Subsequently he has not followed up there currently. He continues overall to do fairly well all things considered with this wound. He is currently utilizing Medihoney Santyl would be extremely expensive for the amount he would need and likely cost prohibitive. 03/29/16; we'll follow this patient on an every two-week basis. He has a fairly substantial stage 3 pressure ulcer over his lower sacrum and coccyx and extending into his bilateral gluteal areas left greater than right. He now has home health. I think [facility]. He is applying Medihoney, kerlix and border foam. He arrives today with the intake nurse reporting a large amount of drainage. The patient stated he put his dressing on it 7:00 this morning by the time he arrived here at 10 there was already a moderate to a large amount of drainage. I once again reviewed his history. He had an attempted closure with myocutaneous flap earlier this year at [facility]. This did not go well. He was offered a diverting colostomy but refused. He is not a candidate for a wound VAC as the actual wound is precariously close to his anal opening. As mentioned he does have [facility] but miraculously this patient who is a paraplegic is actually changing the dressings  himself. 04/12/16 patient presents today for a follow-up of his essentially large sacral pressure ulcer stage III. Nothing has changed dramatically since I last saw him about one month ago. He has seen Dr. Dellia Nims once the interim. With that being said patient's wound appears somewhat less macerated today compared to previous evaluations. He still has no pain being a quadriplegic. 04-26-16 Mr. Kienast returns today for a violation of his stage III sacral pressure ulcer he denies any complaints concerns or issues over the past 2 weeks. He missed to changing dressing twice daily due to drainage although he states this is not an increase in drainage over the past 2 weeks. He does change his dressings independently. He admits to sitting in his motorized chair for no more than 2-3 hours at which time he transfers to bed and rotates lateral position. 05/10/16; Miklo Aken returns today for review of his stage III sacral pressure ulcer. He denies any concerns over the last 2 weeks although he seems to be running out of Aquacel Ag and on those days he uses Medihoney. He has [facility] was supplying his dressings. He still complains of drainage. He does his dressings independently. He has in his motorized chair for 2-3 hours that time other than that he offloads this. Dimensions of the wound are down 1 cm in both directions. He underwent an aggressive debridement on his last visit of thick circumferential skin and subcutaneous tissue. It is possible at some point in the future he is going to need this done again 05/24/16; the patient returns today for review of his stage III sacral pressure ulcer. We have been using Aquacel Ag he tells me that he changes this up to twice a day. I'm not really certain of the reason for this frequency of changing. He has some involvement from the home health nurses but I think is doing most of the  changing himself which I think because of his paraplegia would be a  very difficult exercise. Nevertheless he states that there is "wetness". I am not sure if there is another dressing that we could easily changed that much. I'd wanted to change to [facility] but I'll need to have a sense of how frequent he would need to change this. 06/14/16; this is a patient returns for review of his stage III sacral pressure ulcer. We have been using Aquacel Ag and over the last 2 visits he has had extensive debridement so of the thick circumferential skin and subcutaneous tissue that surrounds this wound. In spite of this really absolutely no change in the condition of the wound warrants measurements. We have [facility] home health I believe changing the dressing on 3 occasions the patient states he does this on one occasion himself 06/28/16; this is a patient who has a fairly large stage III sacral pressure ulcer. I changed him to [facility] from Aquacel 2 weeks ago. He returns today in follow-up. In the meantime a nurse from [facility] has calledrequesting ordering of a wound VAC. He had this discussion before. The problem is the proximity of the lowest edge of this wound to the patient's anal opening roughly 3/4 of an inch. Can't see SADAT, SLIWA ([id number]) how this can be arranged. Apparently the nurse who is calling has a lot of experience, the question would be then when she is not available would be doing this. I would not have thought that this wound is not amenable to a wound VAC because of this reason 07/12/16; the patient comes in today and I have signed orders for a wound VAC. The home health team through [facility] is convinced that he can benefit from this even though there is close proximity to his anal opening beneath the gluteal clefts. The patient does not have a bowel regimen but states he has a bowel movement every 2 days this will also provide some problem with regards to the vac seal 07/26/16; the patient never did obtain a [place] wound VAC  as he could not afford the $200 per month co- pay we have been using Hydrofera Blue now for 6 weeks or so. No major change in this wound at all. He is still not interested in the concept of plastic surgery. There changing the dressing every second day 08/09/16; the patient arrives with a wound precisely in the same situation. In keeping with the plan I outlined last time extensive debridement with an open curet the surface of this is not completely viable. Still has some degree of surrounding thick skin and subcutaneous tissue. No evidence of infection. Once again I have had a conversation with him about plastic surgery, he is simply not interested. 08/23/16; wound is really no different. Thick circumferential skin and subcutaneous tissue around the wound edge which is a lot better from debridement we did earlier in the year. The surface of the wound looks viable however with a curet there is definitely a gritty surface to this. We use Medihoney for a while, he could not afford Santyl. I don't think we could get a supply of Iodoflex. He talks a little more positively about the concept of plastic surgery which I've gone over with him today 08/31/16;; patient arrives in clinic today with the wound surface really no different there is no changes in dimensions. I debrided today surface on the left upper side of this wound aggressively week ago there is no real  change here no evidence of epithelialization. The problem with debridement in the clinic is that he believes from this very liberally. We have been using Sorbact. 09/21/16; absolutely no change in the appearance or measurements of this wound. More recently I've been debrided in this aggressively and using sorbact to see if we could get to a better wound surface. Although this visually looks satisfactory, debridement reveals a very gritty surface to this. However even with this debridement and removal of thick nonviable skin and subcutaneous tissue  from around the large amount of the circumference of this wound we have made absolutely no progress. This may be an offloading issue I'm just not completely certain. It has 2 close proximity in its inferior aspect to consider negative pressure therapy 10/26/16; READMISSION This patient called our clinic yesterday to report an odor in his wound. He had been to see plastic surgery at [facility] at our request after his last visit on 09/21/16; we have been seeing him for several months with a large stage III wound. He had been sent to general surgery for consideration of a colostomy, that appointment was not until mid June He comes in today with a temperature of 101. He is reporting an odor in the wound since last weekend. 01/10/17 Readmission: 01/10/17 On evaluation today it is noted that patient has been seen by plastic surgery at [facility] since he was last evaluated here. They did discuss with him the possibility of a flap according to the notes but unfortunately at this point he was not quite ready to proceed with surgery and instead wanted to give the Wound VAC a try. In the hospital they were able to get a good seal on the Wound VAC. Unfortunately since that time they have been having trouble in regard to his current home health company keeping a simple on the Wound VAC. He would like to switch to a different home health company. With that being said it sounds as if the problem is that his wound VAC is not feeling at the lower portion of his back and he tells me that he can take some of the clear plastic and put over that area when the sill breaks and it will correct it for time. He has no discomfort or pain which is good news. He has been treated with IV vancomycin since he was last seen here and has an appointment with a infectious disease specialist in two days on 01/12/17. Otherwise BRENDON, CHRISTOFFEL ([id number]) he was transferred back to [place] for continuing to monitor and manage is wound as she progresses  with a Wound VAC for the time being. 01/17/17 on evaluation today patient continues to show evidence of slight improvement with the Wound VAC fortunately there's no evidence of infection or otherwise worsening condition in general. Nonetheless we were unable to get him switch to [facility] in regard to home help from his current company. I'm not sure the reasoning behind but for some reason he was not accepted as a patient with him. Continue to apply the Wound VAC which does still show that some maceration around the wound edges but the wound measurements were slightly improved. No fevers, chills, nausea, or vomiting noted at this time. 02/14/17; this patient I have not seen in 5 months although he has been readmitted to our clinic seen by our physician assistant Jeri Cos twice in early August. I have looked through [facility] notes care everywhere. The patient saw plastic surgery in May [Dr. Bhatt}. The patient  was sent to general surgery and ultimately had a colostomy placed. On 11/29/16. This was after he was admitted to [facility] sometime in May. An MRI of the pelvis on 5/23 showed osteomyelitis of the coccyx. An attempt was made to drain fluid that was not successful. He was treated with empiric broad-spectrum antibiotics VAC/cefepime/Flagyl starting on 11/02/16 with plans for a 6 week course. According to their notes he was sent to a nursing home. Was last seen by Dr. Myriam Jacobson of plastic surgery on 12/28/16. The first part of the note is a long dissertation about the difficulties finding adequate patients for flap closure of pressure ulcers. At that time the wound was noted to be stage IV based I think on underlying infection no exposed bone and healthy granulation tissue. 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 He was last seen by infectious disease 01/12/17 A Dr. Uvaldo Rising. His note says that Mr. Jeschke was not interested in a flap closure for referring a  trial of the wound VAC. As previously anticipated the wound VAC could not be maintained as an outpatient in the community. He is now using something similar to a Dakin's wet to dry recommended by [facility] VASHE solution. He is placing this twice a day himself. This is almost s hopeless setting in terms of heeling 02/28/17; he is using a Dakin's wet to dry. Most of the wound surface looks satisfactory however the deeper area over his coccyx now has exposed bone I'm not sure if I noted this last week. Objective Constitutional Sitting or standing Blood Pressure is within target range for patient.. Pulse regular and within target range for patient.Marland Kitchen Respirations regular, non-labored and within target range.. Temperature is normal and within the target range for the patient.Marland Kitchen appears in no distress. Vitals Time Taken: 10:28 AM, Height: 75 in, Weight: 209 lbs, BMI: 26.1, Temperature: 97.9 F, Pulse: 76 bpm, Respiratory Rate: 16 breaths/min, Blood Pressure: 111/72 mmHg. RUAL, VERMEER ([id number]) Eyes Conjunctivae clear. No discharge. Respiratory Respiratory effort is easy and symmetric bilaterally. Rate is normal at rest and on room air.Marland Kitchen Psychiatric No evidence of depression, anxiety, or agitation. Calm, cooperative, and communicative. Appropriate interactions and affect.. General Notes: Wound exam; he has exposed bone again in the coccyx area no overt infection around the area or in the soft tissues ot the rest of the wound is much the same. At the posterior aspect very close to the anal opening which is the major reason I think the vac failed. also he is up in the chair with sweating etc Integumentary (Hair, Skin) There is no soft tissue crepitus no erythema around the deeper parts of the wound or indeed any part of this wound.. Wound #7 status is Open. Original cause of wound was Pressure Injury. The wound is located on the Midline Sacrum. The wound measures 13.5cm length x 11cm width x 4.2cm  depth; 116.632cm^2 area and 489.853cm^3 volume. There is muscle and Fat Layer (Subcutaneous Tissue) Exposed exposed. There is no tunneling or undermining noted. There is a large amount of serosanguineous drainage noted. Foul odor after cleansing was noted. The wound margin is epibole. There is large (67-100%) red granulation within the wound bed. There is a small (1-33%) amount of necrotic tissue within the wound bed including Eschar and Adherent Slough. The periwound skin appearance exhibited: Maceration. The periwound skin appearance did not exhibit: Callus, Crepitus, Excoriation, Induration, Rash, Scarring, Dry/Scaly, Atrophie Blanche, Cyanosis, Ecchymosis, Hemosiderin Staining, Mottled, Pallor, Rubor, Erythema.  Periwound temperature was noted as No Abnormality. Assessment Active Problems ICD-10 L89.154 - Pressure ulcer of sacral region, stage 4 M46.28 - Osteomyelitis of vertebra, sacral and sacrococcygeal region Plan Wound Cleansing: Wound #7 Midline Sacrum: ROBBI, SCURLOCK A. ([id number]) Clean wound with Normal Saline. Cleanse wound with mild soap and water Anesthetic: Wound #7 Midline Sacrum: Topical Lidocaine 4% cream applied to wound bed prior to debridement - for clinic use Skin Barriers/Peri-Wound Care: Wound #7 Midline Sacrum: Skin Prep Primary Wound Dressing: Wound #7 Midline Sacrum: Saline moistened gauze - in [facility] only Other: - Vashe Wound Solution at home - [facility] to provide this for patient Secondary Dressing: Wound #7 Midline Sacrum: ABD pad XtraSorb Dressing Change Frequency: Wound #7 Midline Sacrum: Change dressing every day. Follow-up Appointments: Wound #7 Midline Sacrum: Return Appointment in: - 3 weeks Off-Loading: Wound #7 Midline Sacrum: Turn and reposition every 2 hours Additional Orders / Instructions: Wound #7 Midline Sacrum: Increase protein intake. Home Health: Wound #7 Midline Sacrum: [place] Visits - [facility]  please provide supplies for patient and please make twice weekly visits Home Health Nurse may visit PRN to address patient s wound care needs. FACE TO FACE ENCOUNTER: MEDICARE and MEDICAID PATIENTS: I certify that this patient is under my care and that I had a face-to-face encounter that meets the physician face-to-face encounter requirements with this patient on this date. The encounter with the patient was in whole or in part for the following MEDICAL CONDITION: (primary reason for [place]) MEDICAL NECESSITY: I certify, that based on my findings, NURSING services are a medically necessary home health service. HOME BOUND STATUS: I certify that my clinical findings support that this patient is homebound (i.e., Due to illness or injury, pt requires aid of supportive devices such as crutches, cane, wheelchairs, walkers, the use of special transportation or the assistance of another person to leave their place of residence. There is a normal inability to leave the home and doing so requires considerable and taxing effort. Other absences are for medical reasons / religious services and are infrequent or of short duration when for other reasons). If current dressing causes regression in wound condition, may D/C ordered dressing product/s and apply Normal Saline Moist Dressing daily until next [place] / Other MD appointment. [name] of regression in wound condition at [phone]. Please direct any NON-WOUND related issues/requests for orders to patient's Primary Care Physician Consults ordered were: Plastic Surgery ALMUS, WOODHAM. ([id number]) 1 long discussion today with the patient about how to move forward with this. From my point of view I would like him back at plastic surgery at [facility] to see if they per are prepared for an attempt at flap closure. The patient agreed although I think he is ambivalent about surgery. 2 he seems to want to go back to the  wound VAC the problem being is that we can't ensure that this can be changed and maintained in the community #3 he is been discharged from infectious disease he is completed antibiotics in July. He still has exposed bone superiorly #4 some form of wet to dry dressing is all we can really do here. He asked me about other plastic surgeons but I would like him to continue with [facility], I don't mind getting him a second opinion but I would like to continue with the practice that knows him best for now Electronic Signature(s) Signed: 02/28/2017 4:14:11 PM By: Linton Ham MD Entered By: Dellia Nims,  Saw [place] on 02/28/2017 12:07:18 KHALEEL, BECKOM ([id number]) -------------------------------------------------------------------------------- SuperBill Details Patient Name: KAULIN, CHAVES. Date of Service: 02/28/2017 Medical Record Number: [id number] Patient Account Number: [id number] Date of Birth/Sex: 1946/04/28 (71 y.o. Male) Treating RN: Montey Hora Primary Care Provider: [name] Other Clinician: Referring Provider: [name] Treating Provider/Extender: Ricard Dillon Weeks in Treatment: 7 Diagnosis Coding ICD-10 Codes Code Description L89.154 Pressure ulcer of sacral region, stage 4 M46.28 Osteomyelitis of vertebra, sacral and sacrococcygeal region Physician Procedures CPT4 Code: [id number] Description: 93552 - WC PHYS LEVEL 3 - EST PT ICD-10 Description Diagnosis L89.154 Pressure ulcer of sacral region, stage 4 Modifier: Quantity: 1 Electronic Signature(s) Signed: 02/28/2017 4:14:11 PM By: Linton Ham MD Entered By: Linton Ham on 02/28/2017 12:07:46

## 2017-03-03 DIAGNOSIS — N401 Enlarged prostate with lower urinary tract symptoms: Secondary | ICD-10-CM | POA: Diagnosis not present

## 2017-03-03 DIAGNOSIS — G8220 Paraplegia, unspecified: Secondary | ICD-10-CM | POA: Diagnosis not present

## 2017-03-03 DIAGNOSIS — I10 Essential (primary) hypertension: Secondary | ICD-10-CM | POA: Diagnosis not present

## 2017-03-03 DIAGNOSIS — S24104S Unspecified injury at T11-T12 level of thoracic spinal cord, sequela: Secondary | ICD-10-CM | POA: Diagnosis not present

## 2017-03-03 DIAGNOSIS — L89154 Pressure ulcer of sacral region, stage 4: Secondary | ICD-10-CM | POA: Diagnosis not present

## 2017-03-03 DIAGNOSIS — Z933 Colostomy status: Secondary | ICD-10-CM | POA: Diagnosis not present

## 2017-03-03 DIAGNOSIS — D508 Other iron deficiency anemias: Secondary | ICD-10-CM | POA: Diagnosis not present

## 2017-03-03 DIAGNOSIS — K592 Neurogenic bowel, not elsewhere classified: Secondary | ICD-10-CM | POA: Diagnosis not present

## 2017-03-03 DIAGNOSIS — N319 Neuromuscular dysfunction of bladder, unspecified: Secondary | ICD-10-CM | POA: Diagnosis not present

## 2017-03-06 DIAGNOSIS — S24104S Unspecified injury at T11-T12 level of thoracic spinal cord, sequela: Secondary | ICD-10-CM | POA: Diagnosis not present

## 2017-03-06 DIAGNOSIS — G8220 Paraplegia, unspecified: Secondary | ICD-10-CM | POA: Diagnosis not present

## 2017-03-06 DIAGNOSIS — I10 Essential (primary) hypertension: Secondary | ICD-10-CM | POA: Diagnosis not present

## 2017-03-06 DIAGNOSIS — Z933 Colostomy status: Secondary | ICD-10-CM | POA: Diagnosis not present

## 2017-03-06 DIAGNOSIS — N319 Neuromuscular dysfunction of bladder, unspecified: Secondary | ICD-10-CM | POA: Diagnosis not present

## 2017-03-06 DIAGNOSIS — N401 Enlarged prostate with lower urinary tract symptoms: Secondary | ICD-10-CM | POA: Diagnosis not present

## 2017-03-06 DIAGNOSIS — K592 Neurogenic bowel, not elsewhere classified: Secondary | ICD-10-CM | POA: Diagnosis not present

## 2017-03-06 DIAGNOSIS — D508 Other iron deficiency anemias: Secondary | ICD-10-CM | POA: Diagnosis not present

## 2017-03-06 DIAGNOSIS — L89154 Pressure ulcer of sacral region, stage 4: Secondary | ICD-10-CM | POA: Diagnosis not present

## 2017-03-07 DIAGNOSIS — R8290 Unspecified abnormal findings in urine: Secondary | ICD-10-CM | POA: Diagnosis not present

## 2017-03-08 DIAGNOSIS — S31000S Unspecified open wound of lower back and pelvis without penetration into retroperitoneum, sequela: Secondary | ICD-10-CM | POA: Diagnosis not present

## 2017-03-08 DIAGNOSIS — S24104S Unspecified injury at T11-T12 level of thoracic spinal cord, sequela: Secondary | ICD-10-CM | POA: Diagnosis not present

## 2017-03-08 DIAGNOSIS — R700 Elevated erythrocyte sedimentation rate: Secondary | ICD-10-CM | POA: Diagnosis not present

## 2017-03-08 DIAGNOSIS — N400 Enlarged prostate without lower urinary tract symptoms: Secondary | ICD-10-CM | POA: Diagnosis not present

## 2017-03-08 DIAGNOSIS — G8220 Paraplegia, unspecified: Secondary | ICD-10-CM | POA: Diagnosis not present

## 2017-03-08 DIAGNOSIS — K592 Neurogenic bowel, not elsewhere classified: Secondary | ICD-10-CM | POA: Diagnosis not present

## 2017-03-08 DIAGNOSIS — Z933 Colostomy status: Secondary | ICD-10-CM | POA: Diagnosis not present

## 2017-03-08 DIAGNOSIS — N401 Enlarged prostate with lower urinary tract symptoms: Secondary | ICD-10-CM | POA: Diagnosis not present

## 2017-03-08 DIAGNOSIS — N319 Neuromuscular dysfunction of bladder, unspecified: Secondary | ICD-10-CM | POA: Diagnosis not present

## 2017-03-08 DIAGNOSIS — D508 Other iron deficiency anemias: Secondary | ICD-10-CM | POA: Diagnosis not present

## 2017-03-08 DIAGNOSIS — D509 Iron deficiency anemia, unspecified: Secondary | ICD-10-CM | POA: Diagnosis not present

## 2017-03-08 DIAGNOSIS — I10 Essential (primary) hypertension: Secondary | ICD-10-CM | POA: Diagnosis not present

## 2017-03-08 DIAGNOSIS — L89154 Pressure ulcer of sacral region, stage 4: Secondary | ICD-10-CM | POA: Diagnosis not present

## 2017-03-10 DIAGNOSIS — N319 Neuromuscular dysfunction of bladder, unspecified: Secondary | ICD-10-CM | POA: Diagnosis not present

## 2017-03-10 DIAGNOSIS — N401 Enlarged prostate with lower urinary tract symptoms: Secondary | ICD-10-CM | POA: Diagnosis not present

## 2017-03-10 DIAGNOSIS — L89154 Pressure ulcer of sacral region, stage 4: Secondary | ICD-10-CM | POA: Diagnosis not present

## 2017-03-10 DIAGNOSIS — K592 Neurogenic bowel, not elsewhere classified: Secondary | ICD-10-CM | POA: Diagnosis not present

## 2017-03-10 DIAGNOSIS — G8220 Paraplegia, unspecified: Secondary | ICD-10-CM | POA: Diagnosis not present

## 2017-03-10 DIAGNOSIS — Z933 Colostomy status: Secondary | ICD-10-CM | POA: Diagnosis not present

## 2017-03-10 DIAGNOSIS — I10 Essential (primary) hypertension: Secondary | ICD-10-CM | POA: Diagnosis not present

## 2017-03-10 DIAGNOSIS — D508 Other iron deficiency anemias: Secondary | ICD-10-CM | POA: Diagnosis not present

## 2017-03-10 DIAGNOSIS — S24104S Unspecified injury at T11-T12 level of thoracic spinal cord, sequela: Secondary | ICD-10-CM | POA: Diagnosis not present

## 2017-03-13 DIAGNOSIS — K592 Neurogenic bowel, not elsewhere classified: Secondary | ICD-10-CM | POA: Diagnosis not present

## 2017-03-13 DIAGNOSIS — Z933 Colostomy status: Secondary | ICD-10-CM | POA: Diagnosis not present

## 2017-03-13 DIAGNOSIS — N319 Neuromuscular dysfunction of bladder, unspecified: Secondary | ICD-10-CM | POA: Diagnosis not present

## 2017-03-13 DIAGNOSIS — G8220 Paraplegia, unspecified: Secondary | ICD-10-CM | POA: Diagnosis not present

## 2017-03-13 DIAGNOSIS — N401 Enlarged prostate with lower urinary tract symptoms: Secondary | ICD-10-CM | POA: Diagnosis not present

## 2017-03-13 DIAGNOSIS — D508 Other iron deficiency anemias: Secondary | ICD-10-CM | POA: Diagnosis not present

## 2017-03-13 DIAGNOSIS — S24104S Unspecified injury at T11-T12 level of thoracic spinal cord, sequela: Secondary | ICD-10-CM | POA: Diagnosis not present

## 2017-03-13 DIAGNOSIS — L89154 Pressure ulcer of sacral region, stage 4: Secondary | ICD-10-CM | POA: Diagnosis not present

## 2017-03-13 DIAGNOSIS — I10 Essential (primary) hypertension: Secondary | ICD-10-CM | POA: Diagnosis not present

## 2017-03-15 DIAGNOSIS — I10 Essential (primary) hypertension: Secondary | ICD-10-CM | POA: Diagnosis not present

## 2017-03-15 DIAGNOSIS — N319 Neuromuscular dysfunction of bladder, unspecified: Secondary | ICD-10-CM | POA: Diagnosis not present

## 2017-03-15 DIAGNOSIS — D508 Other iron deficiency anemias: Secondary | ICD-10-CM | POA: Diagnosis not present

## 2017-03-15 DIAGNOSIS — Z933 Colostomy status: Secondary | ICD-10-CM | POA: Diagnosis not present

## 2017-03-15 DIAGNOSIS — N401 Enlarged prostate with lower urinary tract symptoms: Secondary | ICD-10-CM | POA: Diagnosis not present

## 2017-03-15 DIAGNOSIS — S24104S Unspecified injury at T11-T12 level of thoracic spinal cord, sequela: Secondary | ICD-10-CM | POA: Diagnosis not present

## 2017-03-15 DIAGNOSIS — K592 Neurogenic bowel, not elsewhere classified: Secondary | ICD-10-CM | POA: Diagnosis not present

## 2017-03-15 DIAGNOSIS — L89154 Pressure ulcer of sacral region, stage 4: Secondary | ICD-10-CM | POA: Diagnosis not present

## 2017-03-15 DIAGNOSIS — G8220 Paraplegia, unspecified: Secondary | ICD-10-CM | POA: Diagnosis not present

## 2017-03-20 DIAGNOSIS — Z933 Colostomy status: Secondary | ICD-10-CM | POA: Diagnosis not present

## 2017-03-20 DIAGNOSIS — K592 Neurogenic bowel, not elsewhere classified: Secondary | ICD-10-CM | POA: Diagnosis not present

## 2017-03-20 DIAGNOSIS — L89154 Pressure ulcer of sacral region, stage 4: Secondary | ICD-10-CM | POA: Diagnosis not present

## 2017-03-20 DIAGNOSIS — S24104S Unspecified injury at T11-T12 level of thoracic spinal cord, sequela: Secondary | ICD-10-CM | POA: Diagnosis not present

## 2017-03-20 DIAGNOSIS — N401 Enlarged prostate with lower urinary tract symptoms: Secondary | ICD-10-CM | POA: Diagnosis not present

## 2017-03-20 DIAGNOSIS — D508 Other iron deficiency anemias: Secondary | ICD-10-CM | POA: Diagnosis not present

## 2017-03-20 DIAGNOSIS — I10 Essential (primary) hypertension: Secondary | ICD-10-CM | POA: Diagnosis not present

## 2017-03-20 DIAGNOSIS — G8220 Paraplegia, unspecified: Secondary | ICD-10-CM | POA: Diagnosis not present

## 2017-03-20 DIAGNOSIS — N319 Neuromuscular dysfunction of bladder, unspecified: Secondary | ICD-10-CM | POA: Diagnosis not present

## 2017-03-21 ENCOUNTER — Encounter: Payer: Medicare HMO | Attending: Internal Medicine | Admitting: Internal Medicine

## 2017-03-21 DIAGNOSIS — G8220 Paraplegia, unspecified: Secondary | ICD-10-CM | POA: Insufficient documentation

## 2017-03-21 DIAGNOSIS — M069 Rheumatoid arthritis, unspecified: Secondary | ICD-10-CM | POA: Diagnosis not present

## 2017-03-21 DIAGNOSIS — L89154 Pressure ulcer of sacral region, stage 4: Secondary | ICD-10-CM | POA: Diagnosis not present

## 2017-03-21 DIAGNOSIS — M4628 Osteomyelitis of vertebra, sacral and sacrococcygeal region: Secondary | ICD-10-CM | POA: Insufficient documentation

## 2017-03-21 DIAGNOSIS — I10 Essential (primary) hypertension: Secondary | ICD-10-CM | POA: Diagnosis not present

## 2017-03-21 DIAGNOSIS — Z87891 Personal history of nicotine dependence: Secondary | ICD-10-CM | POA: Insufficient documentation

## 2017-03-22 DIAGNOSIS — N401 Enlarged prostate with lower urinary tract symptoms: Secondary | ICD-10-CM | POA: Diagnosis not present

## 2017-03-22 DIAGNOSIS — L89154 Pressure ulcer of sacral region, stage 4: Secondary | ICD-10-CM | POA: Diagnosis not present

## 2017-03-22 DIAGNOSIS — N319 Neuromuscular dysfunction of bladder, unspecified: Secondary | ICD-10-CM | POA: Diagnosis not present

## 2017-03-22 DIAGNOSIS — I10 Essential (primary) hypertension: Secondary | ICD-10-CM | POA: Diagnosis not present

## 2017-03-22 DIAGNOSIS — S24104S Unspecified injury at T11-T12 level of thoracic spinal cord, sequela: Secondary | ICD-10-CM | POA: Diagnosis not present

## 2017-03-22 DIAGNOSIS — Z933 Colostomy status: Secondary | ICD-10-CM | POA: Diagnosis not present

## 2017-03-22 DIAGNOSIS — K592 Neurogenic bowel, not elsewhere classified: Secondary | ICD-10-CM | POA: Diagnosis not present

## 2017-03-22 DIAGNOSIS — D508 Other iron deficiency anemias: Secondary | ICD-10-CM | POA: Diagnosis not present

## 2017-03-22 DIAGNOSIS — G8220 Paraplegia, unspecified: Secondary | ICD-10-CM | POA: Diagnosis not present

## 2017-03-22 NOTE — Progress Notes (Signed)
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 2 weeks 09/10/15 patient is been to see plastic surgery at [facility]. He is being scheduled for a skin graft to the area. The patient has questions about whether he will be able to manage on his own these to be keeping off the graft site. He tells me he had some sort of fall when he went to [facility]. He apparently traumatized the wound and it is really significantly larger today but without evidence of infection. Roughly 2 cm wider and precariously close now to his perianal area and some aspects. 03/02/16; we have not seen this patient in 5 months. He is been followed by plastic surgery at [facility]. The last note from plastic surgery I see was dated 12/15/15. He underwent some form of tissue graft on 09/24/15. This did not the do very well. According the patient is not felt that he could easily undergo additional plastic surgery secondary to the wounds close proximity to the anus. Apparently the patient was offered a diverting  colostomy at one point. In any case he is only been using wet to dry dressings surprisingly changing this himself at home using a mirror. He does not have home health. He does have a level II pressure-relief surface as well as a Roho cushion for his wheelchair. In spite of this the wound is considerably  larger one than when he was last in the clinic currently measuring 12.5 x 7. There is also an [place] ([id number]) area superiorly in the wound that tunnels more deeply. Clearly a stage III wound 03/15/16 patient presents today for reevaluation concerning his midline sacral pressure ulcer. This again is an extensive ulcer which does not extend to bone fortunately but is sufficiently large to make healing of this wound difficult. Again he has been seen at [facility] where apparently they did discuss with him the possibility of a diverting colostomy but he did not want any part of that. Subsequently he has not followed up there currently. He continues overall to do fairly well all things considered with this wound. He is currently utilizing Medihoney Santyl would be extremely expensive for the amount he would need and likely cost prohibitive. 03/29/16; we'll follow this patient on an every two-week basis. He has a fairly substantial stage 3 pressure ulcer over his lower sacrum and coccyx and extending into his bilateral gluteal areas left greater than right. He now has home health[facility]. He is applying Medihoney, kerlix and border foam. He arrives today with the intake nurse reporting a large amount of drainage. The patient stated he put his dressing on it 7:00 this morning by the time he arrived here at 10 there was already a moderate to a large amount of drainage. I once again reviewed his history. He had an attempted closure with myocutaneous flap earlier this year at [facility]. This did not go well. He was offered a diverting colostomy but refused. He is not a candidate for a  wound VAC as the actual wound is precariously close to his anal opening. As mentioned he does have [facility] but miraculously this patient who is a paraplegic is actually changing the dressings himself. 04/12/16 patient presents today for a follow-up of his essentially large sacral pressure ulcer stage III. Nothing has changed dramatically since I last saw him about one month ago. He has seen Dr. Dellia Nims once the interim. With that being said patient's wound appears somewhat less macerated today compared to previous evaluations. He still has no pain being a quadriplegic. 04-26-16 Mr. Canupp returns today for a violation of his stage III sacral pressure ulcer he denies any complaints concerns or issues over the past 2 weeks. He missed to changing dressing twice daily due to drainage although he states this is not an increase in drainage over the past 2 weeks. He does change his dressings independently. He admits to sitting in his motorized chair for no more than 2-3 hours at which time he transfers to bed and rotates lateral position. 05/10/16; [employer] returns today for review of his stage III sacral pressure ulcer. He denies any concerns over the last 2 weeks although he seems to be running out of Aquacel Ag and on those days he uses Medihoney. He has [facility] was supplying his dressings. He still complains of drainage. He does his dressings independently. He has in his motorized chair for 2-3 hours that time other than that he offloads this. Dimensions of the wound are down 1 cm in both directions. He underwent an aggressive debridement on his last visit of thick circumferential skin and subcutaneous tissue. It is possible at some point in the future he is going to need this done again 05/24/16; the patient returns today for review of his stage III sacral pressure ulcer. We have been using Aquacel Ag he  tells me that he changes this up to twice a day. I'm not really certain  of the reason for this frequency of changing. He has some involvement from the home health nurses but I think is doing most of the changing himself which I think because of his paraplegia would be a very difficult exercise. Nevertheless he states that there is "wetness". I am not sure if there is another dressing that we could easily changed that much. I'd wanted to change to [facility] but I'll need to have a sense of how frequent he would need to change this. 06/14/16; this is a patient returns for review of his stage III sacral pressure ulcer. We have been using Aquacel Ag and over the last 2 visits he has had extensive debridement so of the thick circumferential skin and subcutaneous tissue that surrounds this wound. In spite of this really absolutely no change in the condition of the wound warrants measurements. We have [facility] home health I believe changing the dressing on 3 occasions the patient states he does this on one occasion himself 06/28/16; this is a patient who has a fairly large stage III sacral pressure ulcer. I changed him to [facility] from Aquacel 2 weeks ago. He returns today in follow-up. In the meantime a nurse from [facility] has calledrequesting ordering of a wound VAC. He had this discussion before. The problem is the proximity of the lowest edge of this wound to the patient's anal opening roughly 3/4 of an inch. Can't see ELLIOTT, Bradley [name] ([id number]) how this can be arranged. Apparently the nurse who is calling has a lot of experience, the question would be then when she is not available would be doing this. I would not have thought that this wound is not amenable to a wound VAC because of this reason 07/12/16; the patient comes in today and I have signed orders for a wound VAC. The home health team through [facility] is convinced that he can benefit from this even though there is close proximity to his anal opening beneath the gluteal clefts. The patient  does not have a bowel regimen but states he has a bowel movement every 2 days this will also provide some problem with regards to the vac seal 07/26/16; the patient never did obtain a [place] wound VAC as he could not afford the $200 per month co- pay we have been using Hydrofera Blue now for 6 weeks or so. No major change in this wound at all. He is still not interested in the concept of plastic surgery. There changing the dressing every second day 08/09/16; the patient arrives with a wound precisely in the same situation. In keeping with the plan I outlined last time extensive debridement with an open curet the surface of this is not completely viable. Still has some degree of surrounding thick skin and subcutaneous tissue. No evidence of infection. Once again I have had a conversation with him about plastic surgery, he is simply not interested. 08/23/16; wound is really no different. Thick circumferential skin and subcutaneous tissue around the wound edge which is a lot better from debridement we did earlier in the year. The surface of the wound looks viable however with a curet there is definitely a gritty surface to this. We use Medihoney for a while, he could not afford Santyl. I don't think we could get a supply of Iodoflex. He talks a little more positively about the concept of plastic surgery which I've gone over with  him today 08/31/16;; patient arrives in clinic today with the wound surface really no different there is no changes in dimensions. I debrided today surface on the left upper side of this wound aggressively week ago there is no real change here no evidence of epithelialization. The problem with debridement in the clinic is that he believes from this very liberally. We have been using Sorbact. 09/21/16; absolutely no change in the appearance or measurements of this wound. More recently I've been debrided in this aggressively and using sorbact to see if we could get to a better  wound surface. Although this visually looks satisfactory, debridement reveals a very gritty surface to this. However even with this debridement and removal of thick nonviable skin and subcutaneous tissue from around the large amount of the circumference of this wound we have made absolutely no progress. This may be an offloading issue I'm just not completely certain. It has 2 close proximity in its inferior aspect to consider negative pressure therapy 10/26/16; READMISSION This patient called our clinic yesterday to report an odor in his wound. He had been to see plastic surgery at [facility] at our request after his last visit on 09/21/16; we have been seeing him for several months with a large stage III wound. He had been sent to general surgery for consideration of a colostomy, that appointment was not until mid June He comes in today with a temperature of 101. He is reporting an odor in the wound since last weekend. 01/10/17 Readmission: 01/10/17 On evaluation today it is noted that patient has been seen by plastic surgery at [facility] since he was last evaluated here. They did discuss with him the possibility of a flap according to the notes but unfortunately at this point he was not quite ready to proceed with surgery and instead wanted to give the Wound VAC a try. In the hospital they were able to get a good seal on the Wound VAC. Unfortunately since that time they have been having trouble in regard to his current home health company keeping a simple on the Wound VAC. He would like to switch to a different home health company. With that being said it sounds as if the problem is that his wound VAC is not feeling at the lower portion of his back and he tells me that he can take some of the clear plastic and put over that area when the sill breaks and it will correct it for time. He has no discomfort or pain which is good news. He has been treated with IV vancomycin since he was last seen here and has an  appointment with a infectious disease specialist in two days on 01/12/17. Otherwise Bradley [name], Bradley [name] ([id number]) he was transferred back to [place] for continuing to monitor and manage is wound as she progresses with a Wound VAC for the time being. 01/17/17 on evaluation today patient continues to show evidence of slight improvement with the Wound VAC fortunately there's no evidence of infection or otherwise worsening condition in general. Nonetheless we were unable to get him switch to [facility] in regard to home help from his current company. I'm not sure the reasoning behind but for some reason he was not accepted as a patient with him. Continue to apply the Wound VAC which does still show that some maceration around the wound edges but the wound measurements were slightly improved. No fevers, chills, nausea, or vomiting noted at this time. 02/14/17; this patient I have not seen in  5 months although he has been readmitted to our clinic seen by our physician assistant Jeri Cos twice in early August. I have looked through [facility] notes care everywhere. The patient saw plastic surgery in May [Dr. Bhatt}. The patient was sent to general surgery and ultimately had a colostomy placed. On 11/29/16. This was after he was admitted to [facility] sometime in May. An MRI of the pelvis on 5/23 showed osteomyelitis of the coccyx. An attempt was made to drain fluid that was not successful. He was treated with empiric broad-spectrum antibiotics VAC/cefepime/Flagyl starting on 11/02/16 with plans for a 6 week course. According to their notes he was sent to a nursing home. Was last seen by Dr. Myriam Jacobson of plastic surgery on 12/28/16. The first part of the note is a long dissertation about the difficulties finding adequate patients for flap closure of pressure ulcers. At that time the wound was noted to be stage IV based I think on underlying infection no exposed bone and healthy granulation tissue. Since then the patient has  had admission to hospital for herniation of his colostomy. He was last seen by infectious disease 01/12/17 A Dr. Uvaldo Rising. His note says that Mr. Nienhaus was not interested in a flap closure for referring a trial of the wound VAC. As previously anticipated the wound VAC could not be maintained as an outpatient in the community. He is now using something similar to a Dakin's wet to dry recommended by [facility] VASHE solution. He is placing this twice a day himself. This is almost s hopeless setting in terms of heeling 02/28/17; he is using a Dakin's wet to dry. Most of the wound surface looks satisfactory however the deeper area over his coccyx now has exposed bone I'm not sure if I noted this last week. 03/21/17; patient is usingVASHE solution wet to dry which I gather is a variation on Dakin solution. He has home health changing this 3 times a week the other days he does this himself. His appointment with plastic surgery Objective Constitutional Sitting or standing Blood Pressure is within target range for patient.. Pulse regular and within target range for patient.Marland Kitchen Respirations regular, non-labored and within target range.. Temperature is normal and within the target range for the patient.Marland Kitchen appears in no distress. Vitals Time Taken: 9:26 AM, Height: 75 in, Weight: 209 lbs, BMI: 26.1, Temperature: 98.2 F, Pulse: 97 Manganelli, Bayani A. ([id number]) bpm, Respiratory Rate: 16 breaths/min, Blood Pressure: 107/52 mmHg. Respiratory Respiratory effort is easy and symmetric bilaterally. Rate is normal at rest and on room air.. Bilateral breath sounds are clear and equal in all lobes with no wheezes, rales or rhonchi.. Cardiovascular Heart rhythm and rate regular, without murmur or gallop.. Gastrointestinal (GI) Colostomy otherwise no findings. General Notes: When exam; substantial wound in the lower sacrum and surrounding soft tissue including his buttocks. The lower dimensions of this wound are precariously  close to his anus which has really made a wound vac seal virtually impossible using home health in the community. He has exposed bone in 2 areas here 1 at about 11:00 and the other at 12:00. I don't think that these areas are connected. There is no overt soft tissue infection Integumentary (Hair, Skin) Wound #7 status is Open. Original cause of wound was Pressure Injury. The wound is located on the Midline Sacrum. The wound measures 14.2cm length x 11cm width x 4.5cm depth; 122.679cm^2 area and 552.056cm^3 volume. There is bone, muscle, and Fat Layer (Subcutaneous Tissue) Exposed exposed. There is no  tunneling or undermining noted. There is a large amount of serosanguineous drainage noted. Foul odor after cleansing was noted. The wound margin is epibole. There is large (67-100%) red granulation within the wound bed. There is a small (1-33%) amount of necrotic tissue within the wound bed including Eschar and Adherent Slough. The periwound skin appearance exhibited: Maceration. The periwound skin appearance did not exhibit: Callus, Crepitus, Excoriation, Induration, Rash, Scarring, Dry/Scaly, Atrophie Blanche, Cyanosis, Ecchymosis, Hemosiderin Staining, Mottled, Pallor, Rubor, Erythema. Periwound temperature was noted as No Abnormality. Assessment Active Problems ICD-10 L89.154 - Pressure ulcer of sacral region, stage 4 M46.28 - Osteomyelitis of vertebra, sacral and sacrococcygeal region Plan ALANO, Bradley A. ([id number]) Wound Cleansing: Wound #7 Midline Sacrum: Clean wound with Normal Saline. Cleanse wound with mild soap and water Anesthetic: Wound #7 Midline Sacrum: Topical Lidocaine 4% cream applied to wound bed prior to debridement - for clinic use Skin Barriers/Peri-Wound Care: Wound #7 Midline Sacrum: Skin Prep Primary Wound Dressing: Wound #7 Midline Sacrum: Saline moistened gauze - in [facility] only Other: - Vashe Wound Solution at home - [facility] to provide this for  patient - may also use Dakins solution Secondary Dressing: Wound #7 Midline Sacrum: ABD pad XtraSorb Dressing Change Frequency: Wound #7 Midline Sacrum: Change dressing every day. Follow-up Appointments: Wound #7 Midline Sacrum: Return Appointment in: - 3 weeks Off-Loading: Wound #7 Midline Sacrum: Turn and reposition every 2 hours Additional Orders / Instructions: Wound #7 Midline Sacrum: Increase protein intake. Home Health: Wound #7 Midline Sacrum: [place] Visits - [facility] please provide supplies for patient and please make twice weekly visits Home Health Nurse may visit PRN to address patient s wound care needs. FACE TO FACE ENCOUNTER: MEDICARE and MEDICAID PATIENTS: I certify that this patient is under my care and that I had a face-to-face encounter that meets the physician face-to-face encounter requirements with this patient on this date. The encounter with the patient was in whole or in part for the following MEDICAL CONDITION: (primary reason for [place]) MEDICAL NECESSITY: I certify, that based on my findings, NURSING services are a medically necessary home health service. HOME BOUND STATUS: I certify that my clinical findings support that this patient is homebound (i.e., Due to illness or injury, pt requires aid of supportive devices such as crutches, cane, wheelchairs, walkers, the use of special transportation or the assistance of another person to leave their place of residence. There is a normal inability to leave the home and doing so requires considerable and taxing effort. Other absences are for medical reasons / religious services and are infrequent or of short duration when for other reasons). If current dressing causes regression in wound condition, may D/C ordered dressing product/s and apply Normal Saline Moist Dressing daily until next Andrew / Other MD appointment. [place] of regression in wound  condition at [phone]. Please direct any NON-WOUND related issues/requests for orders to patient's Primary Care Physician Bradley [name], Bradley [name] ([id number]) #1 follow-up with plastics in December 5. I think this is important. The patient had his colostomy placed in anticipation of surgery however this was interrupted by osteomyelitis in this area. He is completing antibiotics and then discharged by infectious disease at [facility]. #2 he continues using a version of Dakin's wet to dry daily. #3 he has exposed bone in 2 separate areas. There is no overt infection at least by clinical exam. I wonder whether inflammatory markers were done at the infectious disease clinic prior  to his discharge which I think was in August. I'll see if I can look this up Electronic Signature(s) Signed: 03/21/2017 4:50:14 PM By: Linton Ham MD Entered By: Linton Ham on 03/21/2017 09:51:13 [name] ([id number]) -------------------------------------------------------------------------------- [place] Details Patient Name: Bradley Loa A. Date of Service: 03/21/2017 Medical Record Number: [id number] Patient Account Number: [id number] Date of Birth/Sex: [birth year] (71 y.o. Male) Treating RN: Montey Hora Primary Care Provider: [name] Other Clinician: Referring Provider: [name] Treating Provider/Extender: Ricard Dillon Weeks in Treatment: 10 Diagnosis Coding ICD-10 Codes Code Description L89.154 Pressure ulcer of sacral region, stage 4 M46.28 Osteomyelitis of vertebra, sacral and sacrococcygeal region Facility Procedures CPT4 Code: [id number] Description: 99213 - WOUND CARE VISIT-LEV 3 EST PT Modifier: Quantity: 1 Physician Procedures CPT4 Code: [id number] Description: 11031 - WC PHYS LEVEL 3 - EST PT ICD-10 Description Diagnosis L89.154 Pressure ulcer of sacral region, stage 4 M46.28 Osteomyelitis of vertebra, sacral and sacrococc Modifier: ygeal region Quantity:  1 Electronic Signature(s) Signed: 03/21/2017 4:50:14 PM By: Linton Ham MD Signed: 03/21/2017 5:02:03 PM By: Montey Hora Entered By: Montey Hora on 03/21/2017 10:39:58

## 2017-03-22 NOTE — Progress Notes (Signed)
DORAL, VENTRELLA ([id number]) Visit Report for 03/21/2017 Arrival Information Details Patient Name: Bradley [name], Bradley [name]. Date of Service: 03/21/2017 9:15 AM Medical Record Number: [id number] Patient Account Number: [id number] Date of Birth/Sex: 1945-12-26 (71 y.o. Male) Treating RN: Montey Hora Primary Care Romaine Maciolek: [name] Other Clinician: Referring Linsi Humann: [name] Treating Beatrice Ziehm/Extender: Tito Dine in Treatment: 10 Visit Information History Since Last Visit Added or deleted any medications: No Patient Arrived: Wheel Chair Any new allergies or adverse reactions: No Arrival Time: 09:22 Had a fall or experienced change in No activities of daily living that may affect Accompanied By: self risk of falls: Transfer Assistance: Hoyer Lift Signs or symptoms of abuse/neglect since last No Patient Identification Verified: Yes visito Secondary Verification Process Yes Hospitalized since last visit: No Completed: Has Dressing in Place as Prescribed: Yes Patient Requires Transmission-Based No Pain Present Now: No Precautions: Patient Has Alerts: No Electronic Signature(s) Signed: 03/21/2017 5:02:03 PM By: Montey Hora Entered By: Montey Hora on 03/21/2017 09:24:34 Bradley [name] ([id number]) -------------------------------------------------------------------------------- Clinic Level of Care Assessment Details Patient Name: Bradley Loa A. Date of Service: 03/21/2017 9:15 AM Medical Record Number: [id number] Patient Account Number: [id number] Date of Birth/Sex: 05-19-1946 (71 y.o. Male) Treating RN: Montey Hora Primary Care Sophya Vanblarcom: [name] Other Clinician: Referring Laveyah Oriol: [name] Treating Donaldo Teegarden/Extender: Tito Dine in Treatment: 10 Clinic Level of Care Assessment Items TOOL 4 Quantity Score []  - Use when only an EandM is performed on FOLLOW-UP visit 0 ASSESSMENTS - Nursing Assessment /  Reassessment X - Reassessment of Co-morbidities (includes updates in patient status) 1 10 X - Reassessment of Adherence to Treatment Plan 1 5 ASSESSMENTS - Wound and Skin Assessment / Reassessment X - Simple Wound Assessment / Reassessment - one wound 1 5 []  - Complex Wound Assessment / Reassessment - multiple wounds 0 []  - Dermatologic / Skin Assessment (not related to wound area) 0 ASSESSMENTS - Focused Assessment []  - Circumferential Edema Measurements - multi extremities 0 []  - Nutritional Assessment / Counseling / Intervention 0 []  - Lower Extremity Assessment (monofilament, tuning fork, pulses) 0 []  - Peripheral Arterial Disease Assessment (using hand held doppler) 0 ASSESSMENTS - Ostomy and/or Continence Assessment and Care []  - Incontinence Assessment and Management 0 []  - Ostomy Care Assessment and Management (repouching, etc.) 0 PROCESS - Coordination of Care X - Simple Patient / Family Education for ongoing care 1 15 []  - Complex (extensive) Patient / Family Education for ongoing care 0 []  - Staff obtains Programmer, systems, Records, Test Results / Process Orders 0 []  - Staff telephones HHA, Nursing Homes / Clarify orders / etc 0 []  - Routine Transfer to another Facility (non-emergent condition) 0 Bradley [name], Bradley [name] ([id number]) []  - Routine Hospital Admission (non-emergent condition) 0 []  - New Admissions / Biomedical engineer / Ordering NPWT, Apligraf, etc. 0 []  - Emergency Hospital Admission (emergent condition) 0 X - Simple Discharge Coordination 1 10 []  - Complex (extensive) Discharge Coordination 0 PROCESS - Special Needs []  - Pediatric / Minor Patient Management 0 []  - Isolation Patient Management 0 []  - Hearing / Language / Visual special needs 0 []  - Assessment of Community assistance (transportation, D/C planning, etc.) 0 []  - Additional assistance / Altered mentation 0 []  - Support Surface(s) Assessment (bed, cushion, seat, etc.) 0 INTERVENTIONS - Wound Cleansing /  Measurement X - Simple Wound Cleansing - one wound 1 5 []  - Complex Wound Cleansing - multiple wounds 0 X - Wound Imaging (photographs - any number of wounds) 1  5 []  - Wound Tracing (instead of photographs) 0 X - Simple Wound Measurement - one wound 1 5 []  - Complex Wound Measurement - multiple wounds 0 INTERVENTIONS - Wound Dressings []  - Small Wound Dressing one or multiple wounds 0 X - Medium Wound Dressing one or multiple wounds 1 15 []  - Large Wound Dressing one or multiple wounds 0 []  - Application of Medications - topical 0 []  - Application of Medications - injection 0 INTERVENTIONS - Miscellaneous []  - External ear exam 0 Bradley [name], Bradley A. ([id number]) []  - Specimen Collection (cultures, biopsies, blood, body fluids, etc.) 0 []  - Specimen(s) / Culture(s) sent or taken to Lab for analysis 0 X - Patient Transfer (multiple staff / Harrel Lemon Lift / Similar devices) 1 10 []  - Simple Staple / Suture removal (25 or less) 0 []  - Complex Staple / Suture removal (26 or more) 0 []  - Hypo / Hyperglycemic Management (close monitor of Blood Glucose) 0 []  - Ankle / Brachial Index (ABI) - do not check if billed separately 0 X - Vital Signs 1 5 Has the patient been seen at the hospital within the last three years: Yes Total Score: 90 Level Of Care: New/Established - Level 3 Electronic Signature(s) Signed: 03/21/2017 5:02:03 PM By: Montey Hora Entered By: Montey Hora on 03/21/2017 10:39:49 Bradley [name] ([id number]) -------------------------------------------------------------------------------- Encounter Discharge Information Details Patient Name: Bradley Loa A. Date of Service: 03/21/2017 9:15 AM Medical Record Number: [id number] Patient Account Number: [id number] Date of Birth/Sex: [birth year] (71 y.o. Male) Treating RN: Montey Hora Primary Care Lukas Pelcher: [name] Other Clinician: Referring Dmarion Perfect: [name] Treating Gerri Acre/Extender: Tito Dine in Treatment: 10 Encounter Discharge Information Items Discharge Pain Level: 0 Discharge Condition: Stable Ambulatory Status: Wheelchair Discharge Destination: Home Transportation: Private Auto Accompanied By: self Schedule Follow-up Appointment: Yes Medication Reconciliation completed and provided to Patient/Care No Bradley [name]: Provided on Clinical Summary of Care: 03/21/2017 Form Type Recipient Paper Patient JG Electronic Signature(s) Signed: 03/21/2017 5:02:03 PM By: Montey Hora Entered By: Montey Hora on 03/21/2017 10:40:53 Bradley [name] ([id number]) -------------------------------------------------------------------------------- Multi Wound Chart Details Patient Name: Bradley Loa A. Date of Service: 03/21/2017 9:15 AM Medical Record Number: [id number] Patient Account Number: [id number] Date of Birth/Sex: 05-14-1946 (71 y.o. Male) Treating RN: Montey Hora Primary Care Zain Lankford: [name] Other Clinician: Referring Beadie Matsunaga: [name] Treating Shelma Eiben/Extender: Ricard Dillon Weeks in Treatment: 10 Vital Signs Height(in): 75 Pulse(bpm): 97 Weight(lbs): 209 Blood Pressure 107/52 (mmHg): Body Mass Index(BMI): 26 Temperature(F): 98.2 Respiratory Rate 16 (breaths/min): Photos: [7:No Photos] [N/A:N/A] Wound Location: [7:Sacrum - Midline] [N/A:N/A] Wounding Event: [7:Pressure Injury] [N/A:N/A] Primary Etiology: [7:Pressure Ulcer] [N/A:N/A] Comorbid History: [7:Anemia, Hypertension, History of pressure wounds, Rheumatoid Arthritis, Paraplegia] [N/A:N/A] Date Acquired: [7:11/15/2016] [N/A:N/A] Weeks of Treatment: [7:10] [N/A:N/A] Wound Status: [7:Open] [N/A:N/A] Measurements L x W x D 14.2x11x4.5 [N/A:N/A] (cm) Area (cm) : [7:122.679] [N/A:N/A] Volume (cm) : [7:552.056] [N/A:N/A] % Reduction in Area: [7:-71.90%] [N/A:N/A] % Reduction in Volume: -54.70% [N/A:N/A] Classification: [7:Category/Stage IV] [N/A:N/A] Exudate  Amount: [7:Large] [N/A:N/A] Exudate Type: [7:Serosanguineous] [N/A:N/A] Exudate Color: [7:red, brown] [N/A:N/A] Foul Odor After [7:Yes] [N/A:N/A] Cleansing: Odor Anticipated Due to No [N/A:N/A] Product Use: Wound Margin: [7:Epibole] [N/A:N/A] Granulation Amount: [7:Large (67-100%)] [N/A:N/A] Granulation Quality: [7:Red] [N/A:N/A] Necrotic Amount: [7:Small (1-33%)] [N/A:N/A] Necrotic Tissue: [7:Eschar, Adherent Slough] [N/A:N/A] Exposed Structures: Fat Layer (Subcutaneous N/A N/A Tissue) Exposed: Yes Muscle: Yes Bone: Yes Fascia: No Tendon: No Joint: No Epithelialization: None N/A N/A Periwound Skin Texture: Excoriation: No N/A N/A Induration: No Callus: No Crepitus: No  Rash: No Scarring: No Periwound Skin Maceration: Yes N/A N/A Moisture: Dry/Scaly: No Periwound Skin Color: Atrophie Blanche: No N/A N/A Cyanosis: No Ecchymosis: No Erythema: No Hemosiderin Staining: No Mottled: No Pallor: No Rubor: No Temperature: No Abnormality N/A N/A Tenderness on No N/A N/A Palpation: Wound Preparation: Ulcer Cleansing: N/A N/A Rinsed/Irrigated with Saline Topical Anesthetic Applied: Other: lidocaine 4% Treatment Notes Electronic Signature(s) Signed: 03/21/2017 4:50:14 PM By: Linton Ham MD Entered By: Linton Ham on 03/21/2017 09:47:04 Bradley [name] ([id number]) -------------------------------------------------------------------------------- [place] Details Patient Name: Bradley [name], Bradley A. Date of Service: 03/21/2017 9:15 AM Medical Record Number: [id number] Patient Account Number: [id number] Date of Birth/Sex: 05/05/1946 (71 y.o. Male) Treating RN: Montey Hora Primary Care Lakoda Raske: [name] Other Clinician: Referring Jayland Null: [name] Treating Marsa Matteo/Extender: Ricard Dillon Weeks in Treatment: 10 Active Inactive Electronic Signature(s) Signed: 03/21/2017 5:02:03 PM By: Montey Hora Entered By: Montey Hora on 03/21/2017 09:40:34 Bradley [name] ([id number]) -------------------------------------------------------------------------------- Pain Assessment Details Patient Name: Bradley Loa A. Date of Service: 03/21/2017 9:15 AM Medical Record Number: [id number] Patient Account Number: [id number] Date of Birth/Sex: 1946-04-16 (71 y.o. Male) Treating RN: Montey Hora Primary Care Dade Rodin: [name] Other Clinician: Referring Zykeria Laguardia: [name] Treating Karaline Buresh/Extender: Ricard Dillon Weeks in Treatment: 10 Active Problems Location of Pain Severity and Description of Pain Patient Has Paino No Site Locations Pain Management and Medication Current Pain Management: Electronic Signature(s) Signed: 03/21/2017 5:02:03 PM By: Montey Hora Entered By: Montey Hora on 03/21/2017 09:24:43 Bradley [name] ([id number]) -------------------------------------------------------------------------------- Patient/Caregiver Education Details Patient Name: Bradley Loa A. Date of Service: 03/21/2017 9:15 AM Medical Record Number: [id number] Patient Account Number: [id number] Date of Birth/Gender: [birth year] (71 y.o. Male) Treating RN: Montey Hora Primary Care Physician: [name] Other Clinician: Referring Physician: [name] Treating Physician/Extender: Tito Dine in Treatment: 10 Education Assessment Education Provided To: Patient Education Topics Provided Wound/Skin Impairment: Handouts: Other: wound care as ordered Methods: Demonstration, Explain/Verbal Responses: State content correctly Electronic Signature(s) Signed: 03/21/2017 5:02:03 PM By: Montey Hora Entered By: Montey Hora on 03/21/2017 10:41:18 Bradley [name] ([id number]) -------------------------------------------------------------------------------- Wound Assessment Details Patient Name: Bradley Loa A. Date of Service: 03/21/2017 9:15 AM Medical  Record Number: [id number] Patient Account Number: [id number] Date of Birth/Sex: Dec 23, 1945 (71 y.o. Male) Treating RN: Montey Hora Primary Care Aleshia Cartelli: [name] Other Clinician: Referring Lucie Friedlander: [name] Treating Etheridge Geil/Extender: Ricard Dillon Weeks in Treatment: 10 Wound Status Wound Number: 7 Primary Pressure Ulcer Etiology: Wound Location: Sacrum - Midline Wound Open Wounding Event: Pressure Injury Status: Date Acquired: 11/15/2016 Comorbid Anemia, Hypertension, History of Weeks Of Treatment: 10 History: pressure wounds, Rheumatoid Arthritis, Clustered Wound: No Paraplegia Photos Photo Uploaded By: Montey Hora on 03/21/2017 14:15:00 Wound Measurements Length: (cm) 14.2 Width: (cm) 11 Depth: (cm) 4.5 Area: (cm) 122.679 Volume: (cm) 552.056 % Reduction in Area: -71.9% % Reduction in Volume: -54.7% Epithelialization: None Tunneling: No Undermining: No Wound Description Classification: Category/Stage IV Wound Margin: Epibole Exudate Amount: Large Exudate Type: Serosanguineous Exudate Color: red, brown Foul Odor After Cleansing: Yes Due to Product Use: No Slough/Fibrino Yes Wound Bed Granulation Amount: Large (67-100%) Exposed Structure Granulation Quality: Red Fascia Exposed: No Necrotic Amount: Small (1-33%) Fat Layer (Subcutaneous Tissue) Exposed: Yes Necrotic Quality: Eschar, Adherent Slough Tendon Exposed: No Bradley [name], Bradley A. ([id number]) Muscle Exposed: Yes Necrosis of Muscle: No Joint Exposed: No Bone Exposed: Yes Periwound Skin Texture Texture Color No Abnormalities Noted: No No Abnormalities Noted: No Callus: No Atrophie Blanche: No Crepitus: No Cyanosis: No Excoriation: No  Ecchymosis: No Induration: No Erythema: No Rash: No Hemosiderin Staining: No Scarring: No Mottled: No Pallor: No Moisture Rubor: No No Abnormalities Noted: No Dry / Scaly: No Temperature / Pain Maceration: Yes Temperature: No  Abnormality Wound Preparation Ulcer Cleansing: Rinsed/Irrigated with Saline Topical Anesthetic Applied: Other: lidocaine 4%, Treatment Notes Wound #7 (Midline Sacrum) 1. Cleansed with: Clean wound with Normal Saline 2. Anesthetic Topical Lidocaine 4% cream to wound bed prior to debridement 4. Dressing Applied: Saline moistened guaze Other dressing (specify in notes) 5. Secondary Dressing Applied ABD Pad 7. Secured with Tape Notes Manufacturing systems engineer) Signed: 03/21/2017 5:02:03 PM By: Montey Hora Entered By: Montey Hora on 03/21/2017 09:40:05 Bradley [name] ([id number]) -------------------------------------------------------------------------------- [place] Details Patient Name: Bradley Loa A. Date of Service: 03/21/2017 9:15 AM Medical Record Number: [id number] Patient Account Number: [id number] Date of Birth/Sex: 09-Jul-1945 (71 y.o. Male) Treating RN: Montey Hora Primary Care Elica Almas: [name] Other Clinician: Referring Mardene Lessig: [name] Treating Stina Gane/Extender: Tito Dine in Treatment: 10 Vital Signs Time Taken: 09:26 Temperature (F): 98.2 Height (in): 75 Pulse (bpm): 97 Weight (lbs): 209 Respiratory Rate (breaths/min): 16 Body Mass Index (BMI): 26.1 Blood Pressure (mmHg): 107/52 Reference Range: 80 - 120 mg / dl Electronic Signature(s) Signed: 03/21/2017 5:02:03 PM By: Montey Hora Entered By: Montey Hora on 03/21/2017 09:26:51

## 2017-03-24 DIAGNOSIS — N401 Enlarged prostate with lower urinary tract symptoms: Secondary | ICD-10-CM | POA: Diagnosis not present

## 2017-03-24 DIAGNOSIS — L89154 Pressure ulcer of sacral region, stage 4: Secondary | ICD-10-CM | POA: Diagnosis not present

## 2017-03-24 DIAGNOSIS — N319 Neuromuscular dysfunction of bladder, unspecified: Secondary | ICD-10-CM | POA: Diagnosis not present

## 2017-03-24 DIAGNOSIS — K592 Neurogenic bowel, not elsewhere classified: Secondary | ICD-10-CM | POA: Diagnosis not present

## 2017-03-24 DIAGNOSIS — S24104S Unspecified injury at T11-T12 level of thoracic spinal cord, sequela: Secondary | ICD-10-CM | POA: Diagnosis not present

## 2017-03-24 DIAGNOSIS — I10 Essential (primary) hypertension: Secondary | ICD-10-CM | POA: Diagnosis not present

## 2017-03-24 DIAGNOSIS — Z933 Colostomy status: Secondary | ICD-10-CM | POA: Diagnosis not present

## 2017-03-24 DIAGNOSIS — G8220 Paraplegia, unspecified: Secondary | ICD-10-CM | POA: Diagnosis not present

## 2017-03-24 DIAGNOSIS — D508 Other iron deficiency anemias: Secondary | ICD-10-CM | POA: Diagnosis not present

## 2017-03-27 DIAGNOSIS — D508 Other iron deficiency anemias: Secondary | ICD-10-CM | POA: Diagnosis not present

## 2017-03-27 DIAGNOSIS — L89154 Pressure ulcer of sacral region, stage 4: Secondary | ICD-10-CM | POA: Diagnosis not present

## 2017-03-27 DIAGNOSIS — N319 Neuromuscular dysfunction of bladder, unspecified: Secondary | ICD-10-CM | POA: Diagnosis not present

## 2017-03-27 DIAGNOSIS — G8220 Paraplegia, unspecified: Secondary | ICD-10-CM | POA: Diagnosis not present

## 2017-03-27 DIAGNOSIS — I10 Essential (primary) hypertension: Secondary | ICD-10-CM | POA: Diagnosis not present

## 2017-03-27 DIAGNOSIS — N401 Enlarged prostate with lower urinary tract symptoms: Secondary | ICD-10-CM | POA: Diagnosis not present

## 2017-03-27 DIAGNOSIS — Z933 Colostomy status: Secondary | ICD-10-CM | POA: Diagnosis not present

## 2017-03-27 DIAGNOSIS — S24104S Unspecified injury at T11-T12 level of thoracic spinal cord, sequela: Secondary | ICD-10-CM | POA: Diagnosis not present

## 2017-03-27 DIAGNOSIS — K592 Neurogenic bowel, not elsewhere classified: Secondary | ICD-10-CM | POA: Diagnosis not present

## 2017-03-29 DIAGNOSIS — Z933 Colostomy status: Secondary | ICD-10-CM | POA: Diagnosis not present

## 2017-03-29 DIAGNOSIS — N319 Neuromuscular dysfunction of bladder, unspecified: Secondary | ICD-10-CM | POA: Diagnosis not present

## 2017-03-29 DIAGNOSIS — I10 Essential (primary) hypertension: Secondary | ICD-10-CM | POA: Diagnosis not present

## 2017-03-29 DIAGNOSIS — K592 Neurogenic bowel, not elsewhere classified: Secondary | ICD-10-CM | POA: Diagnosis not present

## 2017-03-29 DIAGNOSIS — D508 Other iron deficiency anemias: Secondary | ICD-10-CM | POA: Diagnosis not present

## 2017-03-29 DIAGNOSIS — L89154 Pressure ulcer of sacral region, stage 4: Secondary | ICD-10-CM | POA: Diagnosis not present

## 2017-03-29 DIAGNOSIS — G8220 Paraplegia, unspecified: Secondary | ICD-10-CM | POA: Diagnosis not present

## 2017-03-29 DIAGNOSIS — N401 Enlarged prostate with lower urinary tract symptoms: Secondary | ICD-10-CM | POA: Diagnosis not present

## 2017-03-29 DIAGNOSIS — S24104S Unspecified injury at T11-T12 level of thoracic spinal cord, sequela: Secondary | ICD-10-CM | POA: Diagnosis not present

## 2017-03-31 DIAGNOSIS — K592 Neurogenic bowel, not elsewhere classified: Secondary | ICD-10-CM | POA: Diagnosis not present

## 2017-03-31 DIAGNOSIS — G8220 Paraplegia, unspecified: Secondary | ICD-10-CM | POA: Diagnosis not present

## 2017-03-31 DIAGNOSIS — N401 Enlarged prostate with lower urinary tract symptoms: Secondary | ICD-10-CM | POA: Diagnosis not present

## 2017-03-31 DIAGNOSIS — L89154 Pressure ulcer of sacral region, stage 4: Secondary | ICD-10-CM | POA: Diagnosis not present

## 2017-03-31 DIAGNOSIS — N319 Neuromuscular dysfunction of bladder, unspecified: Secondary | ICD-10-CM | POA: Diagnosis not present

## 2017-03-31 DIAGNOSIS — I10 Essential (primary) hypertension: Secondary | ICD-10-CM | POA: Diagnosis not present

## 2017-03-31 DIAGNOSIS — D508 Other iron deficiency anemias: Secondary | ICD-10-CM | POA: Diagnosis not present

## 2017-03-31 DIAGNOSIS — S24104S Unspecified injury at T11-T12 level of thoracic spinal cord, sequela: Secondary | ICD-10-CM | POA: Diagnosis not present

## 2017-03-31 DIAGNOSIS — Z933 Colostomy status: Secondary | ICD-10-CM | POA: Diagnosis not present

## 2017-04-03 DIAGNOSIS — Z933 Colostomy status: Secondary | ICD-10-CM | POA: Diagnosis not present

## 2017-04-03 DIAGNOSIS — L89154 Pressure ulcer of sacral region, stage 4: Secondary | ICD-10-CM | POA: Diagnosis not present

## 2017-04-03 DIAGNOSIS — I10 Essential (primary) hypertension: Secondary | ICD-10-CM | POA: Diagnosis not present

## 2017-04-03 DIAGNOSIS — G8220 Paraplegia, unspecified: Secondary | ICD-10-CM | POA: Diagnosis not present

## 2017-04-03 DIAGNOSIS — D508 Other iron deficiency anemias: Secondary | ICD-10-CM | POA: Diagnosis not present

## 2017-04-03 DIAGNOSIS — S24104S Unspecified injury at T11-T12 level of thoracic spinal cord, sequela: Secondary | ICD-10-CM | POA: Diagnosis not present

## 2017-04-03 DIAGNOSIS — N319 Neuromuscular dysfunction of bladder, unspecified: Secondary | ICD-10-CM | POA: Diagnosis not present

## 2017-04-03 DIAGNOSIS — N401 Enlarged prostate with lower urinary tract symptoms: Secondary | ICD-10-CM | POA: Diagnosis not present

## 2017-04-03 DIAGNOSIS — K592 Neurogenic bowel, not elsewhere classified: Secondary | ICD-10-CM | POA: Diagnosis not present

## 2017-04-05 DIAGNOSIS — D508 Other iron deficiency anemias: Secondary | ICD-10-CM | POA: Diagnosis not present

## 2017-04-05 DIAGNOSIS — S24104S Unspecified injury at T11-T12 level of thoracic spinal cord, sequela: Secondary | ICD-10-CM | POA: Diagnosis not present

## 2017-04-05 DIAGNOSIS — G8220 Paraplegia, unspecified: Secondary | ICD-10-CM | POA: Diagnosis not present

## 2017-04-05 DIAGNOSIS — K592 Neurogenic bowel, not elsewhere classified: Secondary | ICD-10-CM | POA: Diagnosis not present

## 2017-04-05 DIAGNOSIS — I10 Essential (primary) hypertension: Secondary | ICD-10-CM | POA: Diagnosis not present

## 2017-04-05 DIAGNOSIS — Z933 Colostomy status: Secondary | ICD-10-CM | POA: Diagnosis not present

## 2017-04-05 DIAGNOSIS — N401 Enlarged prostate with lower urinary tract symptoms: Secondary | ICD-10-CM | POA: Diagnosis not present

## 2017-04-05 DIAGNOSIS — L89154 Pressure ulcer of sacral region, stage 4: Secondary | ICD-10-CM | POA: Diagnosis not present

## 2017-04-05 DIAGNOSIS — N319 Neuromuscular dysfunction of bladder, unspecified: Secondary | ICD-10-CM | POA: Diagnosis not present

## 2017-04-07 DIAGNOSIS — S24104S Unspecified injury at T11-T12 level of thoracic spinal cord, sequela: Secondary | ICD-10-CM | POA: Diagnosis not present

## 2017-04-07 DIAGNOSIS — N319 Neuromuscular dysfunction of bladder, unspecified: Secondary | ICD-10-CM | POA: Diagnosis not present

## 2017-04-07 DIAGNOSIS — G8220 Paraplegia, unspecified: Secondary | ICD-10-CM | POA: Diagnosis not present

## 2017-04-07 DIAGNOSIS — Z933 Colostomy status: Secondary | ICD-10-CM | POA: Diagnosis not present

## 2017-04-07 DIAGNOSIS — D508 Other iron deficiency anemias: Secondary | ICD-10-CM | POA: Diagnosis not present

## 2017-04-07 DIAGNOSIS — N401 Enlarged prostate with lower urinary tract symptoms: Secondary | ICD-10-CM | POA: Diagnosis not present

## 2017-04-07 DIAGNOSIS — I10 Essential (primary) hypertension: Secondary | ICD-10-CM | POA: Diagnosis not present

## 2017-04-07 DIAGNOSIS — L89154 Pressure ulcer of sacral region, stage 4: Secondary | ICD-10-CM | POA: Diagnosis not present

## 2017-04-07 DIAGNOSIS — K592 Neurogenic bowel, not elsewhere classified: Secondary | ICD-10-CM | POA: Diagnosis not present

## 2017-04-10 DIAGNOSIS — N319 Neuromuscular dysfunction of bladder, unspecified: Secondary | ICD-10-CM | POA: Diagnosis not present

## 2017-04-10 DIAGNOSIS — S24104S Unspecified injury at T11-T12 level of thoracic spinal cord, sequela: Secondary | ICD-10-CM | POA: Diagnosis not present

## 2017-04-10 DIAGNOSIS — K592 Neurogenic bowel, not elsewhere classified: Secondary | ICD-10-CM | POA: Diagnosis not present

## 2017-04-10 DIAGNOSIS — I10 Essential (primary) hypertension: Secondary | ICD-10-CM | POA: Diagnosis not present

## 2017-04-10 DIAGNOSIS — D508 Other iron deficiency anemias: Secondary | ICD-10-CM | POA: Diagnosis not present

## 2017-04-10 DIAGNOSIS — L89154 Pressure ulcer of sacral region, stage 4: Secondary | ICD-10-CM | POA: Diagnosis not present

## 2017-04-10 DIAGNOSIS — N401 Enlarged prostate with lower urinary tract symptoms: Secondary | ICD-10-CM | POA: Diagnosis not present

## 2017-04-10 DIAGNOSIS — G8220 Paraplegia, unspecified: Secondary | ICD-10-CM | POA: Diagnosis not present

## 2017-04-10 DIAGNOSIS — Z933 Colostomy status: Secondary | ICD-10-CM | POA: Diagnosis not present

## 2017-04-11 ENCOUNTER — Ambulatory Visit: Payer: Medicare HMO | Admitting: Internal Medicine

## 2017-04-12 ENCOUNTER — Inpatient Hospital Stay (HOSPITAL_BASED_OUTPATIENT_CLINIC_OR_DEPARTMENT_OTHER): Payer: Medicare HMO | Admitting: Oncology

## 2017-04-12 ENCOUNTER — Encounter: Payer: Self-pay | Admitting: Oncology

## 2017-04-12 ENCOUNTER — Inpatient Hospital Stay: Payer: Medicare HMO | Attending: Oncology

## 2017-04-12 ENCOUNTER — Other Ambulatory Visit: Payer: Self-pay | Admitting: Oncology

## 2017-04-12 VITALS — BP 137/72 | HR 74 | Temp 97.0°F | Resp 14 | Wt 206.0 lb

## 2017-04-12 DIAGNOSIS — Z79899 Other long term (current) drug therapy: Secondary | ICD-10-CM | POA: Insufficient documentation

## 2017-04-12 DIAGNOSIS — D649 Anemia, unspecified: Secondary | ICD-10-CM

## 2017-04-12 DIAGNOSIS — D72829 Elevated white blood cell count, unspecified: Secondary | ICD-10-CM | POA: Diagnosis not present

## 2017-04-12 DIAGNOSIS — D473 Essential (hemorrhagic) thrombocythemia: Secondary | ICD-10-CM | POA: Diagnosis not present

## 2017-04-12 DIAGNOSIS — G8220 Paraplegia, unspecified: Secondary | ICD-10-CM | POA: Insufficient documentation

## 2017-04-12 DIAGNOSIS — D508 Other iron deficiency anemias: Secondary | ICD-10-CM | POA: Diagnosis not present

## 2017-04-12 DIAGNOSIS — D75839 Thrombocytosis, unspecified: Secondary | ICD-10-CM

## 2017-04-12 DIAGNOSIS — Z87891 Personal history of nicotine dependence: Secondary | ICD-10-CM | POA: Diagnosis not present

## 2017-04-12 DIAGNOSIS — N400 Enlarged prostate without lower urinary tract symptoms: Secondary | ICD-10-CM | POA: Insufficient documentation

## 2017-04-12 LAB — COMPREHENSIVE METABOLIC PANEL
ALBUMIN: 2.7 g/dL — AB (ref 3.5–5.0)
ALT: 8 U/L — ABNORMAL LOW (ref 17–63)
ANION GAP: 8 (ref 5–15)
AST: 16 U/L (ref 15–41)
Alkaline Phosphatase: 79 U/L (ref 38–126)
BILIRUBIN TOTAL: 0.4 mg/dL (ref 0.3–1.2)
BUN: 11 mg/dL (ref 6–20)
CHLORIDE: 103 mmol/L (ref 101–111)
CO2: 26 mmol/L (ref 22–32)
Calcium: 8.6 mg/dL — ABNORMAL LOW (ref 8.9–10.3)
Creatinine, Ser: 0.47 mg/dL — ABNORMAL LOW (ref 0.61–1.24)
GFR calc Af Amer: >60 mL/min (ref >60)
GFR calc non Af Amer: >60 mL/min (ref >60)
GLUCOSE: 95 mg/dL (ref 65–99)
Potassium: 3.4 mmol/L — ABNORMAL LOW (ref 3.5–5.1)
SODIUM: 137 mmol/L (ref 135–145)
TOTAL PROTEIN: 7.8 g/dL (ref 6.5–8.1)

## 2017-04-12 LAB — CBC WITH DIFFERENTIAL/PLATELET
Basophils Absolute: 0 10*3/uL (ref 0–0.1)
Basophils Relative: 0 %
EOS PCT: 3 %
Eosinophils Absolute: 0.3 10*3/uL (ref 0–0.7)
HEMATOCRIT: 29.7 % — AB (ref 40.0–52.0)
Hemoglobin: 9.2 g/dL — ABNORMAL LOW (ref 13.0–18.0)
LYMPHS ABS: 2.4 10*3/uL (ref 1.0–3.6)
LYMPHS PCT: 22 %
MCH: 20.4 pg — AB (ref 26.0–34.0)
MCHC: 31 g/dL — AB (ref 32.0–36.0)
MCV: 65.7 fL — AB (ref 80.0–100.0)
MONO ABS: 0.9 10*3/uL (ref 0.2–1.0)
MONOS PCT: 9 %
NEUTROS ABS: 7.1 10*3/uL — AB (ref 1.4–6.5)
Neutrophils Relative %: 66 %
PLATELETS: 520 10*3/uL — AB (ref 150–440)
RBC: 4.52 MIL/uL (ref 4.40–5.90)
RDW: 19.6 % — AB (ref 11.5–14.5)
WBC: 10.8 10*3/uL — ABNORMAL HIGH (ref 3.8–10.6)

## 2017-04-12 NOTE — Progress Notes (Signed)
Patient here today for follow up with labs. He states that he is feeling well today. He denies having any pain at this moment. He does have a history of arthritis.

## 2017-04-12 NOTE — Progress Notes (Signed)
Hematology/Oncology Follow Up Note [name] Telephone:([phone] [phone]  CONSULT NOTE Patient Care Team: Bradley Body, MD as PCP - General (Family Medicine)  CHIEF COMPLAINTS/PURPOSE OF CONSULTATION:  I have low blood counts.   HISTORY OF PRESENTING ILLNESS:  Bradley [name] 71 y.o.  male with PMH listed as below who was referred to me for evaluation of iron deficiency anemia.  Patient is paraplegic, also has chronic pressure ulcer, recently just had colostomy. He self catheterizes. Patient feels that his energy levels have been the same. He always has mild fatigue.  He has BPH for which he take finasteride. He has not see his urologist for a while.   INTERVAL HISTORY Patient presents to discuss about the results. He was seen by [facility] for elevated ESR and his chronic hand joint tenderness. He was not considered to have rheumatoid arthritis. He has chronic pressure ulcer, following up with wound care. He will need to have a Flap procedure done in Decembers.    ROS:  Review of Systems  Constitutional: Positive for fatigue.  HENT:  Negative.  Negative for lump/mass and mouth sores.   Eyes: Negative.  Negative for eye problems.  Respiratory: Negative.  Negative for chest tightness and cough.   Cardiovascular: Negative.  Negative for chest pain.  Gastrointestinal: Negative.  Negative for abdominal distention and abdominal pain.       Colostomy  Endocrine: Negative.  Negative for hot flashes.  Genitourinary: Negative for hematuria.        Self catherize  Musculoskeletal: Positive for arthralgias. Negative for back pain.       Paraplegic  Skin: Positive for wound. Negative for itching and rash.       Chronic pressure ulcer  Neurological: Negative.  Negative for dizziness.  Hematological: Negative.  Negative for adenopathy.  Psychiatric/Behavioral: Negative.  The patient is not nervous/anxious.     MEDICAL HISTORY:  Past Medical  History:  Diagnosis Date  . Arthritis   . Paralysis of both lower limbs ([place])     SURGICAL HISTORY: Past Surgical History:  Procedure Laterality Date  . COLONOSCOPY  06/06/2014  . TONSILLECTOMY      SOCIAL HISTORY: Social History   Socioeconomic History  . Marital status: Married    Spouse name: Not on file  . Number of children: Not on file  . Years of education: Not on file  . Highest education level: Not on file  Social Needs  . Financial resource strain: Not on file  . Food insecurity - worry: Not on file  . Food insecurity - inability: Not on file  . Transportation needs - medical: Not on file  . Transportation needs - non-medical: Not on file  Occupational History  . Not on file  Tobacco Use  . Smoking status: Former Smoker    Packs/day: 0.50    Years: 5.00    Pack years: 2.50  . Smokeless tobacco: Never Used  Substance and Sexual Activity  . Alcohol use: No  . Drug use: No  . Sexual activity: Not on file  Other Topics Concern  . Not on file  Social History Narrative  . Not on file    FAMILY HISTORY: Family History  Problem Relation Age of Onset  . Breast cancer Mother     ALLERGIES:  has No Known Allergies.  MEDICATIONS:  Current Outpatient Medications  Medication Sig Dispense Refill  . acetaminophen (TYLENOL) 500 MG tablet Take by mouth.    Marland Kitchen amLODipine (NORVASC) 5  MG tablet Take 5 mg by mouth daily.    . baclofen (LIORESAL) 10 MG tablet Take 20 mg by mouth 3 (three) times daily.    . [employer] ZINC 50 MG TABS Take 50 mg by mouth 2 (two) times daily.    . ferrous sulfate 325 (65 FE) MG tablet Take 325 mg by mouth 2 (two) times daily.    . finasteride (PROSCAR) 5 MG tablet Take 5 mg by mouth daily.    . Multiple Vitamin (MULTI-VITAMINS) TABS Take by mouth.    . polyethylene glycol powder (GLYCOLAX/MIRALAX) powder Take by mouth.    . vitamin C (ASCORBIC ACID) 500 MG tablet Take 500 mg by mouth 2 (two) times daily.     No current facility-administered  medications for this visit.       Marland Kitchen  PHYSICAL EXAMINATION: ECOG PERFORMANCE STATUS: 2 - Symptomatic, <50% confined to bed Vitals:   04/12/17 1336  BP: 137/72  Pulse: 74  Resp: 14  Temp: (!) 97 F (36.1 C)   Filed Weights   04/12/17 1334  Weight: 206 lb (93.4 kg)   GENERAL: No distress, well nourished.  SKIN:  No rashes or significant lesions  HEAD: Normocephalic, No masses, lesions, tenderness or abnormalities  EYES: Conjunctiva are pink, non icteric ENT: External ears normal ,lips , buccal mucosa, and tongue normal and mucous membranes are moist  LYMPH: No palpable cervical and axillary lymphadenopathy  LUNGS: Clear to auscultation, no crackles or wheezes HEART: Regular rate & rhythm, no murmurs, no gallops, S1 normal and S2 normal  ABDOMEN: Abdomen soft, non-tender, normal bowel sounds, I did not appreciate any  masses or organomegaly  MUSCULOSKELETAL:Musculoskeletal: paraplegic. Bilateral ulnar deviation of metacarpophalangeal joints and joint tenderness.  EXTREMITIES: No edema, no skin discoloration or tenderness NEURO: Alert & oriented,  LABORATORY DATA:  I have reviewed the data as listed Lab Results  Component Value Date   WBC 10.8 (H) 04/12/2017   HGB 9.2 (L) 04/12/2017   HCT 29.7 (L) 04/12/2017   MCV 65.7 (L) 04/12/2017   PLT 520 (H) 04/12/2017   Recent Labs    02/15/17 1234  NA 138  K 3.7  CL 102  CO2 27  GLUCOSE 85  BUN 8  CREATININE 0.52*  CALCIUM 8.9  GFRNONAA >60  GFRAA >60  PROT 7.7  ALBUMIN 2.8*  AST 15  ALT 9*  ALKPHOS 72  BILITOT 0.5   Iron/TIBC/Ferritin/ %Sat    Component Value Date/Time   IRON 10 (L) 02/15/2017 1234   TIBC 244 (L) 02/15/2017 1234   FERRITIN 103 02/15/2017 1234   IRONPCTSAT 4 (L) 02/15/2017 1234     ASSESSMENT & PLAN:  1. Anemia, unspecified type   2. Thrombocytosis ([place])    # His microcytic anemia is likely multifactorial, possibly a combination of iron deficiency and chronic inflammation, and also  possible alpha thalassemia. .He has always been microcytic in previous labs.   Ferritin is 103, low serum iron and low iron saturation, more consistent with anemia of chronic inflammation, which is consistent with elevated ESR.   Ferritin can be falsely elevated due to inflammation status.  soluble transferrin receptor level is at high normal limit, and calculated . soluble transferrin receptor/log ferritin ratio is elevated at 8, indicated possible iron deficiency. He has been taking oral iron supplementation for years without improvement. Plan IV iron with Venofer 235m weekly for 2 doses. Allergy reactions/infusion reaction including anaphylactic reaction discussed with patient. Patient voices understanding and willing to proceed. Check  stool occult today All questions were answered. The patient knows to call the clinic with any problems questions or concerns.  Return of visit: 3 weeks for re-evaluation. . Recheck cbc, iron tibc, ferritin, UA.  Thank you for this kind referral and the opportunity to participate in the care of this patient. A copy of today's note is routed to referring provider  Dr.Linthavong Lucianne Muss.   Earlie Server, MD, PhD Hematology Oncology [name] Pager- [phone] 04/12/2017

## 2017-04-14 DIAGNOSIS — N401 Enlarged prostate with lower urinary tract symptoms: Secondary | ICD-10-CM | POA: Diagnosis not present

## 2017-04-14 DIAGNOSIS — N319 Neuromuscular dysfunction of bladder, unspecified: Secondary | ICD-10-CM | POA: Diagnosis not present

## 2017-04-14 DIAGNOSIS — S24104S Unspecified injury at T11-T12 level of thoracic spinal cord, sequela: Secondary | ICD-10-CM | POA: Diagnosis not present

## 2017-04-14 DIAGNOSIS — K592 Neurogenic bowel, not elsewhere classified: Secondary | ICD-10-CM | POA: Diagnosis not present

## 2017-04-14 DIAGNOSIS — L89154 Pressure ulcer of sacral region, stage 4: Secondary | ICD-10-CM | POA: Diagnosis not present

## 2017-04-14 DIAGNOSIS — I10 Essential (primary) hypertension: Secondary | ICD-10-CM | POA: Diagnosis not present

## 2017-04-14 DIAGNOSIS — G8220 Paraplegia, unspecified: Secondary | ICD-10-CM | POA: Diagnosis not present

## 2017-04-14 DIAGNOSIS — Z933 Colostomy status: Secondary | ICD-10-CM | POA: Diagnosis not present

## 2017-04-14 DIAGNOSIS — D508 Other iron deficiency anemias: Secondary | ICD-10-CM | POA: Diagnosis not present

## 2017-04-15 DIAGNOSIS — G8220 Paraplegia, unspecified: Secondary | ICD-10-CM | POA: Diagnosis not present

## 2017-04-15 DIAGNOSIS — Z87891 Personal history of nicotine dependence: Secondary | ICD-10-CM | POA: Diagnosis not present

## 2017-04-15 DIAGNOSIS — N400 Enlarged prostate without lower urinary tract symptoms: Secondary | ICD-10-CM | POA: Diagnosis not present

## 2017-04-15 DIAGNOSIS — D473 Essential (hemorrhagic) thrombocythemia: Secondary | ICD-10-CM | POA: Diagnosis not present

## 2017-04-15 DIAGNOSIS — D508 Other iron deficiency anemias: Secondary | ICD-10-CM | POA: Diagnosis not present

## 2017-04-15 DIAGNOSIS — D72829 Elevated white blood cell count, unspecified: Secondary | ICD-10-CM | POA: Diagnosis not present

## 2017-04-15 DIAGNOSIS — D649 Anemia, unspecified: Secondary | ICD-10-CM | POA: Diagnosis not present

## 2017-04-15 DIAGNOSIS — Z79899 Other long term (current) drug therapy: Secondary | ICD-10-CM | POA: Diagnosis not present

## 2017-04-16 DIAGNOSIS — G8220 Paraplegia, unspecified: Secondary | ICD-10-CM | POA: Diagnosis not present

## 2017-04-16 DIAGNOSIS — Z87891 Personal history of nicotine dependence: Secondary | ICD-10-CM | POA: Diagnosis not present

## 2017-04-16 DIAGNOSIS — N400 Enlarged prostate without lower urinary tract symptoms: Secondary | ICD-10-CM | POA: Diagnosis not present

## 2017-04-16 DIAGNOSIS — D649 Anemia, unspecified: Secondary | ICD-10-CM | POA: Diagnosis not present

## 2017-04-16 DIAGNOSIS — Z79899 Other long term (current) drug therapy: Secondary | ICD-10-CM | POA: Diagnosis not present

## 2017-04-16 DIAGNOSIS — D473 Essential (hemorrhagic) thrombocythemia: Secondary | ICD-10-CM | POA: Diagnosis not present

## 2017-04-16 DIAGNOSIS — D72829 Elevated white blood cell count, unspecified: Secondary | ICD-10-CM | POA: Diagnosis not present

## 2017-04-16 DIAGNOSIS — D508 Other iron deficiency anemias: Secondary | ICD-10-CM | POA: Diagnosis not present

## 2017-04-17 DIAGNOSIS — N400 Enlarged prostate without lower urinary tract symptoms: Secondary | ICD-10-CM | POA: Diagnosis not present

## 2017-04-17 DIAGNOSIS — D473 Essential (hemorrhagic) thrombocythemia: Secondary | ICD-10-CM | POA: Diagnosis not present

## 2017-04-17 DIAGNOSIS — D508 Other iron deficiency anemias: Secondary | ICD-10-CM | POA: Diagnosis not present

## 2017-04-17 DIAGNOSIS — I10 Essential (primary) hypertension: Secondary | ICD-10-CM | POA: Diagnosis not present

## 2017-04-17 DIAGNOSIS — Z79899 Other long term (current) drug therapy: Secondary | ICD-10-CM | POA: Diagnosis not present

## 2017-04-17 DIAGNOSIS — G8220 Paraplegia, unspecified: Secondary | ICD-10-CM | POA: Diagnosis not present

## 2017-04-17 DIAGNOSIS — Z933 Colostomy status: Secondary | ICD-10-CM | POA: Diagnosis not present

## 2017-04-17 DIAGNOSIS — N319 Neuromuscular dysfunction of bladder, unspecified: Secondary | ICD-10-CM | POA: Diagnosis not present

## 2017-04-17 DIAGNOSIS — S24104S Unspecified injury at T11-T12 level of thoracic spinal cord, sequela: Secondary | ICD-10-CM | POA: Diagnosis not present

## 2017-04-17 DIAGNOSIS — L89154 Pressure ulcer of sacral region, stage 4: Secondary | ICD-10-CM | POA: Diagnosis not present

## 2017-04-17 DIAGNOSIS — K592 Neurogenic bowel, not elsewhere classified: Secondary | ICD-10-CM | POA: Diagnosis not present

## 2017-04-17 DIAGNOSIS — Z87891 Personal history of nicotine dependence: Secondary | ICD-10-CM | POA: Diagnosis not present

## 2017-04-17 DIAGNOSIS — D649 Anemia, unspecified: Secondary | ICD-10-CM | POA: Diagnosis not present

## 2017-04-17 DIAGNOSIS — D72829 Elevated white blood cell count, unspecified: Secondary | ICD-10-CM | POA: Diagnosis not present

## 2017-04-17 DIAGNOSIS — N401 Enlarged prostate with lower urinary tract symptoms: Secondary | ICD-10-CM | POA: Diagnosis not present

## 2017-04-18 ENCOUNTER — Encounter: Payer: Medicare HMO | Attending: Internal Medicine | Admitting: Internal Medicine

## 2017-04-18 DIAGNOSIS — I10 Essential (primary) hypertension: Secondary | ICD-10-CM | POA: Insufficient documentation

## 2017-04-18 DIAGNOSIS — Z87891 Personal history of nicotine dependence: Secondary | ICD-10-CM | POA: Insufficient documentation

## 2017-04-18 DIAGNOSIS — M4628 Osteomyelitis of vertebra, sacral and sacrococcygeal region: Secondary | ICD-10-CM | POA: Diagnosis not present

## 2017-04-18 DIAGNOSIS — G8220 Paraplegia, unspecified: Secondary | ICD-10-CM | POA: Insufficient documentation

## 2017-04-18 DIAGNOSIS — L89154 Pressure ulcer of sacral region, stage 4: Secondary | ICD-10-CM | POA: Diagnosis not present

## 2017-04-18 DIAGNOSIS — M069 Rheumatoid arthritis, unspecified: Secondary | ICD-10-CM | POA: Diagnosis not present

## 2017-04-19 ENCOUNTER — Other Ambulatory Visit: Payer: Self-pay

## 2017-04-19 ENCOUNTER — Inpatient Hospital Stay: Payer: Medicare HMO

## 2017-04-19 ENCOUNTER — Other Ambulatory Visit: Payer: Self-pay | Admitting: Oncology

## 2017-04-19 VITALS — BP 117/58 | HR 86 | Temp 97.8°F | Resp 20

## 2017-04-19 DIAGNOSIS — D508 Other iron deficiency anemias: Secondary | ICD-10-CM

## 2017-04-19 DIAGNOSIS — D473 Essential (hemorrhagic) thrombocythemia: Secondary | ICD-10-CM | POA: Diagnosis not present

## 2017-04-19 DIAGNOSIS — D649 Anemia, unspecified: Secondary | ICD-10-CM | POA: Diagnosis not present

## 2017-04-19 DIAGNOSIS — Z87891 Personal history of nicotine dependence: Secondary | ICD-10-CM | POA: Diagnosis not present

## 2017-04-19 DIAGNOSIS — N400 Enlarged prostate without lower urinary tract symptoms: Secondary | ICD-10-CM | POA: Diagnosis not present

## 2017-04-19 DIAGNOSIS — Z79899 Other long term (current) drug therapy: Secondary | ICD-10-CM | POA: Diagnosis not present

## 2017-04-19 DIAGNOSIS — D509 Iron deficiency anemia, unspecified: Secondary | ICD-10-CM | POA: Insufficient documentation

## 2017-04-19 DIAGNOSIS — D72829 Elevated white blood cell count, unspecified: Secondary | ICD-10-CM | POA: Diagnosis not present

## 2017-04-19 DIAGNOSIS — G8220 Paraplegia, unspecified: Secondary | ICD-10-CM | POA: Diagnosis not present

## 2017-04-19 LAB — OCCULT BLOOD X 1 CARD TO LAB, STOOL
FECAL OCCULT BLD: NEGATIVE
Fecal Occult Bld: NEGATIVE
Fecal Occult Bld: NEGATIVE

## 2017-04-19 MED ORDER — SODIUM CHLORIDE 0.9 % IV SOLN
INTRAVENOUS | Status: DC
  Administered 2017-04-19: 14:00:00 via INTRAVENOUS
  Filled 2017-04-19: qty 1000

## 2017-04-19 MED ORDER — IRON SUCROSE 20 MG/ML IV SOLN
200.0000 mg | Freq: Once | INTRAVENOUS | Status: AC
Start: 2017-04-19 — End: 2017-04-19
  Administered 2017-04-19: 200 mg via INTRAVENOUS
  Filled 2017-04-19: qty 10

## 2017-04-20 DIAGNOSIS — N319 Neuromuscular dysfunction of bladder, unspecified: Secondary | ICD-10-CM | POA: Diagnosis not present

## 2017-04-20 DIAGNOSIS — D508 Other iron deficiency anemias: Secondary | ICD-10-CM | POA: Diagnosis not present

## 2017-04-20 DIAGNOSIS — I10 Essential (primary) hypertension: Secondary | ICD-10-CM | POA: Diagnosis not present

## 2017-04-20 DIAGNOSIS — G8220 Paraplegia, unspecified: Secondary | ICD-10-CM | POA: Diagnosis not present

## 2017-04-20 DIAGNOSIS — Z933 Colostomy status: Secondary | ICD-10-CM | POA: Diagnosis not present

## 2017-04-20 DIAGNOSIS — S24104S Unspecified injury at T11-T12 level of thoracic spinal cord, sequela: Secondary | ICD-10-CM | POA: Diagnosis not present

## 2017-04-20 DIAGNOSIS — L89154 Pressure ulcer of sacral region, stage 4: Secondary | ICD-10-CM | POA: Diagnosis not present

## 2017-04-20 DIAGNOSIS — K592 Neurogenic bowel, not elsewhere classified: Secondary | ICD-10-CM | POA: Diagnosis not present

## 2017-04-20 DIAGNOSIS — N400 Enlarged prostate without lower urinary tract symptoms: Secondary | ICD-10-CM | POA: Diagnosis not present

## 2017-04-20 NOTE — Progress Notes (Addendum)
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 07/29/15 stage III pressure ulcer over his coccyx and adjacent right gluteal. He is using Prisma and previously has used Aquacel Ag. There has been small improvements in the measurements although this may be measurement. In talking with him he apparently changes the dressing every day although it appears that only half the days will he have collagen may be the rest of the day following that. He has home health coming in but that description sounded vague as well. He has a rotation on his wheelchair and an air mattress. I would need to discuss pressure relieved with him more next time to have a sense of this 08/12/15; the patient has been using Hydrofera Blue. Base of the wound appears healthy. Less adherent surface slough. He has an appointment with the plastic surgery at [facility] on March 29. We have been following him every 2 weeks 09/10/15 patient is been to see plastic surgery at [facility]. He is being scheduled for a skin graft to the area. The patient  has questions about whether he will be able to manage on his own these to be keeping off the graft site. He tells me he had some sort of fall when he went to [facility]. He apparently traumatized the wound and it is really significantly larger today but without evidence of infection. Roughly 2 cm wider and precariously close now to his perianal area  and some aspects. 03/02/16; we have not seen this patient in 5 months. He is been followed by plastic surgery at [facility]. The last note from plastic surgery I see was dated 12/15/15. He underwent some form of tissue graft on 09/24/15. This did not the do very well. According the patient is not felt that he could easily undergo additional plastic surgery secondary to the wounds close proximity to the anus. Apparently the patient was offered a diverting colostomy at one point. In any case he is only been using wet to dry dressings surprisingly changing this himself at home using a mirror. He does not have home health. He does have a level II pressure-relief surface as well as a Roho cushion for his wheelchair. In spite of this the wound is considerably larger one than when he was last in the clinic currently measuring 12.5 x 7. There is also an area superiorly in the wound that tunnels more deeply. Clearly a stage III wound 03/15/16 patient presents today for reevaluation concerning his midline sacral pressure ulcer. This again is an extensive ulcer which does not extend to bone fortunately but is sufficiently large to make healing of this wound difficult. Again he has been seen at [facility] where apparently they did discuss with him the possibility of a diverting colostomy but he did not want any part of that. Subsequently he has not followed up there currently. He continues overall to do fairly well all things considered with this wound. He is currently utilizing Medihoney Santyl would be extremely expensive for the amount he would need and likely cost  prohibitive. 03/29/16; we'll follow this patient on an every two-week basis. He has a fairly substantial stage 3 pressure ulcer over his lower sacrum and coccyx and extending into his bilateral gluteal areas left greater than right. He now has home health. I think [facility]. He is applying Medihoney, kerlix and border foam. He arrives today with the intake nurse reporting a large amount of drainage. The patient stated he put his dressing on it 7:00 this morning by the time he arrived here at 10 there was already a moderate to a large amount of drainage. I once again reviewed his history. He had an attempted closure with myocutaneous flap earlier this year at [facility]. This did not go well. He was offered a diverting colostomy but refused. He is HADI, Bradley [name] ([id number]) not a candidate for a wound VAC as the actual wound is precariously close to his anal opening. As mentioned he does have [facility] but miraculously this patient who is a paraplegic is actually changing the dressings himself. 04/12/16 patient presents today for a follow-up of his essentially large sacral pressure ulcer stage III. Nothing has changed dramatically since I last saw him about one month ago. He has seen Dr. Dellia Nims once the interim. With that being said patient's wound appears somewhat less macerated today compared to previous evaluations. He still has no pain being a quadriplegic. 04-26-16 Mr. Victory returns today for a violation of his stage III sacral pressure ulcer he denies any complaints concerns or issues over the past 2 weeks. He missed to changing dressing twice daily due to drainage although he states this is not an increase in drainage over the past 2 weeks. He does change his dressings independently. He admits to sitting in his motorized chair for no more than 2-3 hours at which time he transfers to bed and rotates lateral position. 05/10/16; Mister Bromwell returns today  for review of his stage III  sacral pressure ulcer. He denies any concerns over the last 2 weeks although he seems to be running out of Aquacel Ag and on those days he uses Medihoney. He has [facility] was supplying his dressings. He still complains of drainage. He does his dressings independently. He has in his motorized chair for 2-3 hours that time other than that he offloads this. Dimensions of the wound are down 1 cm in both directions. He underwent an aggressive debridement on his last visit of thick circumferential skin and subcutaneous tissue. It is possible at some point in the future he is going to need this done again 05/24/16; the patient returns today for review of his stage III sacral pressure ulcer. We have been using Aquacel Ag he tells me that he changes this up to twice a day. I'm not really certain of the reason for this frequency of changing. He has some involvement from the home health nurses but I think is doing most of the changing himself which I think because of his paraplegia would be a very difficult exercise. Nevertheless he states that there is "wetness". I am not sure if there is another dressing that we could easily changed that much. I'd wanted to change to [facility] but I'll need to have a sense of how frequent he would need to change this. 06/14/16; this is a patient returns for review of his stage III sacral pressure ulcer. We have been using Aquacel Ag and over the last 2 visits he has had extensive debridement so of the thick circumferential skin and subcutaneous tissue that surrounds this wound. In spite of this really absolutely no change in the condition of the wound warrants measurements. We have [facility] home health I believe changing the dressing on 3 occasions the patient states he does this on one occasion himself 06/28/16; this is a patient who has a fairly large stage III sacral pressure ulcer. I changed him to [facility] from Aquacel 2 weeks ago. He returns today in  follow-up. In the meantime a nurse from [facility] has calledrequesting ordering of a wound VAC. He had this discussion before. The problem is the proximity of the lowest edge of this wound to the patient's anal opening roughly 3/4 of an inch. Can't see how this can be arranged. Apparently the nurse who is calling has a lot of experience, the question would be then when she is not available would be doing this. I would not have thought that this wound is not amenable to a wound VAC because of this reason 07/12/16; the patient comes in today and I have signed orders for a wound VAC. The home health team through [facility] is convinced that he can benefit from this even though there is close proximity to his anal opening beneath the gluteal clefts. The patient does not have a bowel regimen but states he has a bowel movement every 2 days this will also provide some problem with regards to the vac seal 07/26/16; the patient never did obtain a [place] wound VAC as he could not afford the $200 per month co-pay we have been using Hydrofera Blue now for 6 weeks or so. No major change in this wound at all. He is still not interested in the concept of plastic surgery. There changing the dressing every second day 08/09/16; the patient arrives with a wound precisely in the same situation. In keeping with the plan I outlined last time extensive  debridement with an open curet the surface of this is not completely viable. Still has some degree of surrounding thick skin and subcutaneous tissue. No evidence of infection. Once again I have had a conversation with him about plastic surgery, he is simply not interested. 08/23/16; wound is really no different. Thick circumferential skin and subcutaneous tissue around the wound edge which is a lot better from debridement we did earlier in the year. The surface of the wound looks viable however with a curet there is definitely a gritty surface to this. We use Medihoney  for a while, he could not afford Santyl. I don't think we could get a supply of Iodoflex. He talks a little more positively about the concept of plastic surgery which I've gone over with him today 08/31/16;; patient arrives in clinic today with the wound surface really no different there is no changes in dimensions. I debrided today surface on the left upper side of this wound aggressively week ago there is no real change here no evidence of epithelialization. The problem with debridement in the clinic is that he believes from this very liberally. We have been using Sorbact. 09/21/16; absolutely no change in the appearance or measurements of this wound. More recently I've been debrided in this aggressively and using sorbact to see if we could get to a better wound surface. Although this visually looks satisfactory, debridement reveals a very gritty surface to this. However even with this debridement and removal of thick nonviable skin and subcutaneous tissue from around the large amount of the circumference of this wound we have made absolutely no progress. This may be an offloading issue I'm just not completely certain. It has 2 close proximity in its inferior aspect to consider negative pressure therapy 10/26/16; VEDANSH, KERSTETTER ([id number]) This patient called our clinic yesterday to report an odor in his wound. He had been to see plastic surgery at [facility] at our request after his last visit on 09/21/16; we have been seeing him for several months with a large stage III wound. He had been sent to general surgery for consideration of a colostomy, that appointment was not until mid June He comes in today with a temperature of 101. He is reporting an odor in the wound since last weekend. 01/10/17 Readmission: 01/10/17 On evaluation today it is noted that patient has been seen by plastic surgery at [facility] since he was last evaluated here. They did discuss with him the possibility of a flap according  to the notes but unfortunately at this point he was not quite ready to proceed with surgery and instead wanted to give the Wound VAC a try. In the hospital they were able to get a good seal on the Wound VAC. Unfortunately since that time they have been having trouble in regard to his current home health company keeping a simple on the Wound VAC. He would like to switch to a different home health company. With that being said it sounds as if the problem is that his wound VAC is not feeling at the lower portion of his back and he tells me that he can take some of the clear plastic and put over that area when the sill breaks and it will correct it for time. He has no discomfort or pain which is good news. He has been treated with IV vancomycin since he was last seen here and has an appointment with a infectious disease specialist in two days on 01/12/17. Otherwise he was transferred  back to [place] for continuing to monitor and manage is wound as she progresses with a Wound VAC for the time being. 01/17/17 on evaluation today patient continues to show evidence of slight improvement with the Wound VAC fortunately there's no evidence of infection or otherwise worsening condition in general. Nonetheless we were unable to get him switch to [facility] in regard to home help from his current company. I'm not sure the reasoning behind but for some reason he was not accepted as a patient with him. Continue to apply the Wound VAC which does still show that some maceration around the wound edges but the wound measurements were slightly improved. No fevers, chills, nausea, or vomiting noted at this time. 02/14/17; this patient I have not seen in 5 months although he has been readmitted to our clinic seen by our physician assistant Jeri Cos twice in early August. I have looked through [facility] notes care everywhere. The patient saw plastic surgery in May [Dr. Bhatt}. The patient was sent to general surgery and  ultimately had a colostomy placed. On 11/29/16. This was after he was admitted to [facility] sometime in May. An MRI of the pelvis on 5/23 showed osteomyelitis of the coccyx. An attempt was made to drain fluid that was not successful. He was treated with empiric broad-spectrum antibiotics VAC/cefepime/Flagyl starting on 11/02/16 with plans for a 6 week course. According to their notes he was sent to a nursing home. Was last seen by Dr. Myriam Jacobson of plastic surgery on 12/28/16. The first part of the note is a long dissertation about the difficulties finding adequate patients for flap closure of pressure ulcers. At that time the wound was noted to be stage IV based I think on underlying infection no exposed bone and healthy granulation tissue. 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 He was last seen by infectious disease 01/12/17 A Dr. Uvaldo Rising. His note says that Mr. Judice was not interested in a flap closure for referring a trial of the wound VAC. As previously anticipated the wound VAC could not be maintained as an outpatient in the community. He is now using something similar to a Dakin's wet to dry recommended by [facility] VASHE solution. He is placing this twice a day himself. This is almost s hopeless setting in terms of heeling 02/28/17; he is using a Dakin's wet to dry. Most of the wound surface looks satisfactory however the deeper area over his coccyx now has exposed bone I'm not sure if I noted this last week. 03/21/17; patient is usingVASHE solution wet to dry which I gather is a variation on Dakin solution. He has home health changing this 3 times a week the other days he does this himself. His appointment with plastic surgery 04/18/17; patient continues to use a variant of Dakin solution I believe. His wound continues to have a clean viable surface. The 2 areas of exposed bone in the center of this wound had closed over. He has an appointment with plastic surgery  on December 5 at which time I hope that there'll be a plan for myocutaneous flap closure In looking through [place] link I couldn't find any more plastic surgery appointments. I did come across the fact that he is been followed by hematology for a microcytic hypochromic anemia. He had a reasonably normal looking hemoglobin electrophoresis. His iron level was 10 and according to the patient he is going for IV iron infusions starting tomorrow. He had a  sedimentation rate of 74. More problematically from a pure wound care point of view his albumin was 2.7 earlier this month Bradley [name], Bradley A. ([id number]) Objective Constitutional Sitting or standing Blood Pressure is within target range for patient.. Pulse regular and within target range for patient.Marland Kitchen Respirations regular, non-labored and within target range.. Temperature is normal and within the target range for the patient.Marland Kitchen appears in no distress. Vitals Time Taken: 11:02 AM, Height: 75 in, Weight: 209 lbs, BMI: 26.1, Temperature: 98.1 F, Pulse: 63 bpm, Respiratory Rate: 16 breaths/min, Blood Pressure: 135/64 mmHg. Eyes Conjunctivae clear. No discharge. Respiratory Respiratory effort is easy and symmetric bilaterally. Rate is normal at rest and on room air.. Cardiovascular he does not appear dehydrated. Gastrointestinal (GI) no liverspleen. Ostomy noted. No liver or spleen enlargement or tenderness.Marland Kitchen Psychiatric Patient appears depressed today.. General Notes: wound exam; substantial wound in the lower sacrum and surrounding soft tissue including bilateral buttocks. The lower aspect of this wound approaches his anal opening at about 1-1/2 inches. This is made this previously and impossible wound to place a wound VAC at least according to home health. Integumentary (Hair, Skin) Wound #7 status is Open. Original cause of wound was Pressure Injury. The wound is located on the Midline Sacrum. The wound measures 14.5cm length x 10.5cm  width x 4.5cm depth; 119.577cm^2 area and 538.096cm^3 volume. There is muscle and Fat Layer (Subcutaneous Tissue) Exposed exposed. There is tunneling at :00 with a maximum distance of 2.9cm. There is additional tunneling and at 12:00 with a maximum distance of 2cm. There is a large amount of serosanguineous drainage noted. The wound margin is epibole. There is large (67-100%) red granulation within the wound bed. There is a small (1-33%) amount of necrotic tissue within the wound bed including Adherent Slough. The periwound skin appearance exhibited: Maceration. The periwound skin appearance did not exhibit: Callus, Crepitus, Excoriation, Induration, Rash, Scarring, Dry/Scaly, Atrophie Blanche, Cyanosis, Ecchymosis, Hemosiderin Staining, Mottled, Pallor, Rubor, Erythema. Periwound temperature was noted as No Abnormality. General Notes: There is a tunnel in the center of wound bed tunneling 2.9cm towards 11:00. Assessment Active Problems ICD-10 L89.154 - Pressure ulcer of sacral region, stage 4 M46.28 - Osteomyelitis of vertebra, sacral and sacrococcygeal region Bradley [name], Bradley A. ([id number]) Plan Wound Cleansing: Wound #7 Midline Sacrum: Clean wound with Normal Saline. Cleanse wound with mild soap and water Anesthetic: Wound #7 Midline Sacrum: Topical Lidocaine 4% cream applied to wound bed prior to debridement - for clinic use Skin Barriers/Peri-Wound Care: Wound #7 Midline Sacrum: Skin Prep Primary Wound Dressing: Wound #7 Midline Sacrum: Saline moistened gauze - in [facility] only Other: - Vashe Wound Solution at home - [facility] to provide this for patient - may also use Dakins solution Secondary Dressing: Wound #7 Midline Sacrum: ABD pad XtraSorb Dressing Change Frequency: Wound #7 Midline Sacrum: Change dressing every day. Follow-up Appointments: Wound #7 Midline Sacrum: Return Appointment in: - 3 weeks Off-Loading: Wound #7 Midline Sacrum: Turn and reposition every 2  hours Additional Orders / Instructions: Wound #7 Midline Sacrum: Increase protein intake. - Eat more and use protein supplements. (Ensure or Boost) Home Health: Wound #7 Midline Sacrum: [place] Visits - [facility] please provide supplies for patient and please make twice weekly visits Home Health Nurse may visit PRN to address patient s wound care needs. FACE TO FACE ENCOUNTER: MEDICARE and MEDICAID PATIENTS: I certify that this patient is under my care and that I had a face-to-face encounter that meets the physician face-to-face  encounter requirements with this patient on this date. The encounter with the patient was in whole or in part for the following MEDICAL CONDITION: (primary reason for [place]) MEDICAL NECESSITY: I certify, that based on my findings, NURSING services are a medically necessary home health service. HOME BOUND STATUS: I certify that my clinical findings support that this patient is homebound (i.e., Due to illness or injury, pt requires aid of supportive devices such as crutches, cane, wheelchairs, walkers, the use of special transportation or the assistance of another person to leave their place of residence. There is a normal inability to leave the home and doing so requires considerable and taxing effort. Other absences are for medical reasons / religious services and are infrequent or of short duration when for other reasons). If current dressing causes regression in wound condition, may D/C ordered dressing product/s and apply Normal Saline Moist Dressing daily until next [place] / Other MD appointment. [place] of regression in wound condition at [phone]. Please direct any NON-WOUND related issues/requests for orders to patient's Primary Care Physician General Notes: Plastics Appointment 05/10/17 @ 1:00pm [name], Bradley A. ([id number]) #1 I've continued with the same dressings he is getting which uses Vashe  solution. This is certainly The surface of the wound clean and he has had closure of the areas of exposed bone that he had a month ago. Furthermore I use multiple dressings on him last time including collagen Hydrofera Blue none of this seemed to work Engineer, maintenance) Signed: 04/24/2017 3:15:38 PM By: Gretta Cool, BSN, RN, CWS, Kim RN, BSN Signed: 04/26/2017 8:07:00 AM By: Linton Ham MD Previous Signature: 04/19/2017 4:36:42 PM Version By: Linton Ham MD Entered By: Gretta Cool, BSN, RN, CWS, Kim on 04/24/2017 15:15:38 [name] ([id number]) -------------------------------------------------------------------------------- [place] Details Patient Name: Bradley [name], Bradley A. Date of Service: 04/18/2017 Medical Record Number: [id number] Patient Account Number: [id number] Date of Birth/Sex: [birth year] (71 y.o. Male) Treating RN: Montey Hora Primary Care Provider: [name] Other Clinician: Referring Provider: [name] Treating Provider/Extender: Tito Dine in Treatment: 14 Diagnosis Coding ICD-10 Codes Code Description L89.154 Pressure ulcer of sacral region, stage 4 M46.28 Osteomyelitis of vertebra, sacral and sacrococcygeal region Facility Procedures CPT4 Code: [id number] Description: 99213 - WOUND CARE VISIT-LEV 3 EST PT Modifier: Quantity: 1 Physician Procedures CPT4 Code: 7408144 Description: 81856 - WC PHYS LEVEL 3 - EST PT ICD-10 Diagnosis Description L89.154 Pressure ulcer of sacral region, stage 4 M46.28 Osteomyelitis of vertebra, sacral and sacrococcygeal reg Modifier: ion Quantity: 1 Electronic Signature(s) Signed: 04/19/2017 4:36:42 PM By: Linton Ham MD Entered By: Linton Ham on 04/18/2017 12:12:37

## 2017-04-22 NOTE — Progress Notes (Signed)
Bradley [name] ([id number]) Visit Report for 04/18/2017 Arrival Information Details Patient Name: Bradley [name], Bradley [name]. Date of Service: 04/18/2017 11:00 AM Medical Record Number: [id number] Patient Account Number: [id number] Date of Birth/Sex: 1945/09/21 (71 y.o. Male) Treating RN: Roger Shelter Primary Care Kyson Kupper: [name] Other Clinician: Referring Tommi Crepeau: [name] Treating Robt Okuda/Extender: Tito Dine in Treatment: 14 Visit Information History Since Last Visit Added or deleted any medications: No Patient Arrived: Wheel Chair Any new allergies or adverse reactions: No Arrival Time: 11:01 Had a fall or experienced change in No activities of daily living that may affect Accompanied By: transport risk of falls: Transfer Assistance: Hoyer Lift Signs or symptoms of abuse/neglect since last visito No Patient Identification Verified: Yes Hospitalized since last visit: No Secondary Verification Process Completed: Yes Pain Present Now: No Patient Requires Transmission-Based No Precautions: Patient Has Alerts: No Electronic Signature(s) Signed: 04/18/2017 4:47:02 PM By: Roger Shelter Entered By: Roger Shelter on 04/18/2017 11:02:31 [name] ([id number]) -------------------------------------------------------------------------------- Clinic Level of Care Assessment Details Patient Name: [name], [name] Date of Service: 04/18/2017 11:00 AM Medical Record Number: [id number] Patient Account Number: [id number] Date of Birth/Sex: February 05, 1946 (71 y.o. Male) Treating RN: Cornell Barman Primary Care Rogers Ditter: [name] Other Clinician: Referring Mikhail Hallenbeck: [name] Treating Caffie Sotto/Extender: Tito Dine in Treatment: 14 Clinic Level of Care Assessment Items TOOL 4 Quantity Score []  - Use when only an EandM is performed on FOLLOW-UP visit 0 ASSESSMENTS - Nursing Assessment / Reassessment X - Reassessment of  Co-morbidities (includes updates in patient status) 1 10 X- 1 5 Reassessment of Adherence to Treatment Plan ASSESSMENTS - Wound and Skin Assessment / Reassessment X - Simple Wound Assessment / Reassessment - one wound 1 5 []  - 0 Complex Wound Assessment / Reassessment - multiple wounds []  - 0 Dermatologic / Skin Assessment (not related to wound area) ASSESSMENTS - Focused Assessment []  - Circumferential Edema Measurements - multi extremities 0 []  - 0 Nutritional Assessment / Counseling / Intervention []  - 0 Lower Extremity Assessment (monofilament, tuning fork, pulses) []  - 0 Peripheral Arterial Disease Assessment (using hand held doppler) ASSESSMENTS - Ostomy and/or Continence Assessment and Care []  - Incontinence Assessment and Management 0 []  - 0 Ostomy Care Assessment and Management (repouching, etc.) PROCESS - Coordination of Care X - Simple Patient / Family Education for ongoing care 1 15 []  - 0 Complex (extensive) Patient / Family Education for ongoing care X- 1 10 Staff obtains Programmer, systems, Records, Test Results / Process Orders []  - 0 Staff telephones HHA, Nursing Homes / Clarify orders / etc []  - 0 Routine Transfer to another Facility (non-emergent condition) []  - 0 Routine Hospital Admission (non-emergent condition) []  - 0 New Admissions / Biomedical engineer / Ordering NPWT, Apligraf, etc. []  - 0 Emergency Hospital Admission (emergent condition) X- 1 10 Simple Discharge Coordination TAYGEN, ACKLIN. ([id number]) []  - 0 Complex (extensive) Discharge Coordination PROCESS - Special Needs []  - Pediatric / Minor Patient Management 0 []  - 0 Isolation Patient Management []  - 0 Hearing / Language / Visual special needs []  - 0 Assessment of Community assistance (transportation, D/C planning, etc.) []  - 0 Additional assistance / Altered mentation []  - 0 Support Surface(s) Assessment (bed, cushion, seat, etc.) INTERVENTIONS - Wound Cleansing / Measurement X -  Simple Wound Cleansing - one wound 1 5 []  - 0 Complex Wound Cleansing - multiple wounds X- 1 5 Wound Imaging (photographs - any number of wounds) []  - 0 Wound Tracing (instead of photographs) X-  1 5 Simple Wound Measurement - one wound []  - 0 Complex Wound Measurement - multiple wounds INTERVENTIONS - Wound Dressings []  - Small Wound Dressing one or multiple wounds 0 X- 1 15 Medium Wound Dressing one or multiple wounds []  - 0 Large Wound Dressing one or multiple wounds []  - 0 Application of Medications - topical []  - 0 Application of Medications - injection INTERVENTIONS - Miscellaneous []  - External ear exam 0 []  - 0 Specimen Collection (cultures, biopsies, blood, body fluids, etc.) []  - 0 Specimen(s) / Culture(s) sent or taken to Lab for analysis []  - 0 Patient Transfer (multiple staff / Civil Service fast streamer / Similar devices) []  - 0 Simple Staple / Suture removal (25 or less) []  - 0 Complex Staple / Suture removal (26 or more) []  - 0 Hypo / Hyperglycemic Management (close monitor of Blood Glucose) []  - 0 Ankle / Brachial Index (ABI) - do not check if billed separately X- 1 5 Vital Signs Rabbani, Tay A. ([id number]) Has the patient been seen at the hospital within the last three years: Yes Total Score: 90 Level Of Care: New/Established - Level 3 Electronic Signature(s) Signed: 04/18/2017 5:09:04 PM By: Gretta Cool, BSN, RN, CWS, Kim RN, BSN Entered By: Gretta Cool, BSN, RN, CWS, Kim on 04/18/2017 11:28:10 [name] ([id number]) -------------------------------------------------------------------------------- Complex / Palliative Patient Assessment Details Patient Name: Bradley [name] A. Date of Service: 04/18/2017 11:00 AM Medical Record Number: [id number] Patient Account Number: [id number] Date of Birth/Sex: 1945/10/11 (71 y.o. Male) Treating RN: Cornell Barman Primary Care Oron Westrup: [name] Other Clinician: Referring Sherill Mangen: [name] Treating  Jayen Bromwell/Extender: Tito Dine in Treatment: 14 Palliative Management Criteria Complex Wound Management Criteria Patient requires a surgical procedure in order to achieve wound healing: Referral Made However, the patient does not wish to undergo the recommended surgical procedure. Patient has remarkable or complex co-morbidities requiring medications or treatments that extend wound healing times. Examples: o Diabetes mellitus with chronic renal failure or end stage renal disease requiring dialysis o Advanced or poorly controlled rheumatoid arthritis o Diabetes mellitus and end stage chronic obstructive pulmonary disease o Active cancer with current chemo- or radiation therapy paraplegia Care Approach Wound Care Plan: Complex Wound Management Electronic Signature(s) Signed: 04/18/2017 5:09:04 PM By: Gretta Cool, BSN, RN, CWS, Kim RN, BSN Signed: 04/19/2017 4:36:42 PM By: Linton Ham MD Entered By: Gretta Cool, BSN, RN, CWS, Kim on 04/18/2017 11:17:53 [name] ([id number]) -------------------------------------------------------------------------------- Encounter Discharge Information Details Patient Name: KUMAR, FALWELL A. Date of Service: 04/18/2017 11:00 AM Medical Record Number: [id number] Patient Account Number: [id number] Date of Birth/Sex: [birth year] (71 y.o. Male) Treating RN: Cornell Barman Primary Care Guy Toney: [name] Other Clinician: Referring Kamariya Blevens: [name] Treating Clarita Mcelvain/Extender: Tito Dine in Treatment: 14 Encounter Discharge Information Items Discharge Pain Level: 0 Discharge Condition: Stable Ambulatory Status: Wheelchair Discharge Destination: Home Transportation: Private Auto Accompanied By: self Schedule Follow-up Appointment: Yes Medication Reconciliation completed and Yes provided to Patient/Care Jencarlos Nicolson: Provided on Clinical Summary of Care: 04/18/2017 Form Type Recipient Paper Patient  JG Electronic Signature(s) Signed: 04/19/2017 11:35:39 AM By: Ruthine Dose Entered By: Ruthine Dose on 04/18/2017 11:39:44 [name] ([id number]) -------------------------------------------------------------------------------- Lower Extremity Assessment Details Patient Name: [name] Date of Service: 04/18/2017 11:00 AM Medical Record Number: [id number] Patient Account Number: [id number] Date of Birth/Sex: 09-Jan-1946 (71 y.o. Male) Treating RN: Cornell Barman Primary Care Khalee Mazo: [name] Other Clinician: Referring Johnhenry Tippin: [name] Treating Aerianna Losey/Extender: Ricard Dillon Weeks in Treatment: 14 Electronic Signature(s) Signed: 04/18/2017 5:09:04 PM  By: Gretta Cool, BSN, RN, CWS, Kim RN, BSN Entered By: Gretta Cool, BSN, RN, CWS, Kim on 04/18/2017 11:16:02 [name] ([id number]) -------------------------------------------------------------------------------- Multi Wound Chart Details Patient Name: OSAMU, OLGUIN A. Date of Service: 04/18/2017 11:00 AM Medical Record Number: [id number] Patient Account Number: [id number] Date of Birth/Sex: 1945/09/21 (71 y.o. Male) Treating RN: Cornell Barman Primary Care Whittaker Lenis: [name] Other Clinician: Referring Medhansh Brinkmeier: [name] Treating Ghassan Coggeshall/Extender: Tito Dine in Treatment: 14 Vital Signs Height(in): 75 Pulse(bpm): 37 Weight(lbs): 209 Blood Pressure(mmHg): 135/64 Body Mass Index(BMI): 26 Temperature(F): 98.1 Respiratory Rate 16 (breaths/min): Photos: [7:No Photos] [N/A:N/A] Wound Location: [7:Sacrum - Midline] [N/A:N/A] Wounding Event: [7:Pressure Injury] [N/A:N/A] Primary Etiology: [7:Pressure Ulcer] [N/A:N/A] Comorbid History: [7:Anemia, Hypertension, History of pressure wounds, Rheumatoid Arthritis, Paraplegia] [N/A:N/A] Date Acquired: [7:11/15/2016] [N/A:N/A] Weeks of Treatment: [7:14] [N/A:N/A] Wound Status: [7:Open] [N/A:N/A] Measurements L x W x D  [7:14.5x10.5x4.5] [N/A:N/A] (cm) Area (cm) : [7:119.577] [N/A:N/A] Volume (cm) : [7:538.096] [N/A:N/A] % Reduction in Area: [7:-67.50%] [N/A:N/A] % Reduction in Volume: [7:-50.80%] [N/A:N/A] Position 1 (o'clock): Maximum Distance 1 (cm): [7:2.9] Position 2 (o'clock): [7:12] Maximum Distance 2 (cm): [7:2] Tunneling: [7:Yes] [N/A:N/A] Classification: [7:Category/Stage IV] [N/A:N/A] Exudate Amount: [7:Large] [N/A:N/A] Exudate Type: [7:Serosanguineous] [N/A:N/A] Exudate Color: [7:red, brown] [N/A:N/A] Wound Margin: [7:Epibole] [N/A:N/A] Granulation Amount: [7:Large (67-100%)] [N/A:N/A] Granulation Quality: [7:Red] [N/A:N/A] Necrotic Amount: [7:Small (1-33%)] [N/A:N/A] Exposed Structures: [7:Fat Layer (Subcutaneous Tissue) Exposed: Yes Muscle: Yes Fascia: No Tendon: No Joint: No Bone: No] [N/A:N/A] Epithelialization: None N/A N/A Periwound Skin Texture: Excoriation: No N/A N/A Induration: No Callus: No Crepitus: No Rash: No Scarring: No Periwound Skin Moisture: Maceration: Yes N/A N/A Dry/Scaly: No Periwound Skin Color: Atrophie Blanche: No N/A N/A Cyanosis: No Ecchymosis: No Erythema: No Hemosiderin Staining: No Mottled: No Pallor: No Rubor: No Temperature: No Abnormality N/A N/A Tenderness on Palpation: No N/A N/A Wound Preparation: Ulcer Cleansing: N/A N/A Rinsed/Irrigated with Saline Topical Anesthetic Applied: Other: lidocaine 4% Assessment Notes: There is a tunnel in the center N/A N/A of wound bed tunneling 2.9cm towards 11:00. Treatment Notes Wound #7 (Midline Sacrum) 1. Cleansed with: Clean wound with Dakins/Anasept 2. Anesthetic Topical Lidocaine 4% cream to wound bed prior to debridement 4. Dressing Applied: Other dressing (specify in notes) 5. Secondary Dressing Applied ABD Pad Notes Dakins soaked gauze, ABD, xsorb, tape Electronic Signature(s) Signed: 04/19/2017 4:36:42 PM By: Linton Ham MD Entered By: Linton Ham on 04/18/2017  11:38:36 [name] ([id number]) -------------------------------------------------------------------------------- [place] Details Patient Name: COLON, RUETH A. Date of Service: 04/18/2017 11:00 AM Medical Record Number: [id number] Patient Account Number: [id number] Date of Birth/Sex: February 28, 1946 (71 y.o. Male) Treating RN: Cornell Barman Primary Care Talin Rozeboom: [name] Other Clinician: Referring Josaphine Shimamoto: [name] Treating Angellynn Kimberlin/Extender: Tito Dine in Treatment: 14 Active Inactive ` Pressure Nursing Diagnoses: Knowledge deficit related to causes and risk factors for pressure ulcer development Knowledge deficit related to management of pressures ulcers Potential for impaired tissue integrity related to pressure, friction, moisture, and shear Goals: Patient will remain free from development of additional pressure ulcers Date Initiated: 01/10/2017 Target Resolution Date: 04/15/2017 Goal Status: Active Interventions: Assess: immobility, friction, shearing, incontinence upon admission and as needed Assess offloading mechanisms upon admission and as needed Assess potential for pressure ulcer upon admission and as needed Provide education on pressure ulcers Notes: ` Wound/Skin Impairment Nursing Diagnoses: Impaired tissue integrity Knowledge deficit related to ulceration/compromised skin integrity Goals: Ulcer/skin breakdown will have a volume reduction of 80% by week 12 Date Initiated: 01/10/2017 Target Resolution Date: 05/13/2017 Goal Status:  Active Interventions: Assess patient/caregiver ability to perform ulcer/skin care regimen upon admission and as needed Assess ulceration(s) every visit Notes: Electronic Signature(s) Signed: 04/18/2017 5:09:04 PM By: Gretta Cool, BSN, RN, CWS, Kim RN, BSN Entered By: Gretta Cool, BSN, RN, CWS, Kim on 04/18/2017 11:18:01 LYNTON, CRESCENZO ([id number]) KOWEN, KLUTH  ([id number]) -------------------------------------------------------------------------------- Pain Assessment Details Patient Name: HUZAIFA, VINEY A. Date of Service: 04/18/2017 11:00 AM Medical Record Number: [id number] Patient Account Number: [id number] Date of Birth/Sex: 02/28/1946 (71 y.o. Male) Treating RN: Roger Shelter Primary Care Holt Woolbright: [name] Other Clinician: Referring Kashira Behunin: [name] Treating Osiah Haring/Extender: Tito Dine in Treatment: 14 Active Problems Location of Pain Severity and Description of Pain Patient Has Paino No Site Locations Pain Management and Medication Current Pain Management: Electronic Signature(s) Signed: 04/18/2017 4:47:02 PM By: Roger Shelter Entered By: Roger Shelter on 04/18/2017 11:02:40 [name] ([id number]) -------------------------------------------------------------------------------- Patient/Caregiver Education Details Patient Name: [name] Date of Service: 04/18/2017 11:00 AM Medical Record Number: [id number] Patient Account Number: [id number] Date of Birth/Gender: 12/03/1945 (71 y.o. Male) Treating RN: Cornell Barman Primary Care Physician: [name] Other Clinician: Referring Physician: [name] Treating Physician/Extender: Tito Dine in Treatment: 14 Education Assessment Education Provided To: Patient Education Topics Provided Nutrition: Handouts: Nutrition Methods: Demonstration, Explain/Verbal, Printed Responses: State content correctly [employer]) Signed: 04/18/2017 5:09:04 PM By: Gretta Cool, BSN, RN, CWS, Kim RN, BSN Entered By: Gretta Cool, BSN, RN, CWS, Kim on 04/18/2017 11:32:10 [name] ([id number]) -------------------------------------------------------------------------------- Wound Assessment Details Patient Name: RAWAD, BOCHICCHIO A. Date of Service: 04/18/2017 11:00 AM Medical Record Number: [id number] Patient Account  Number: [id number] Date of Birth/Sex: Oct 21, 1945 (71 y.o. Male) Treating RN: Cornell Barman Primary Care Abas Leicht: [name] Other Clinician: Referring Alyxander Kollmann: [name] Treating Brieonna Crutcher/Extender: Ricard Dillon Weeks in Treatment: 14 Wound Status Wound Number: 7 Primary Pressure Ulcer Etiology: Wound Location: Sacrum - Midline Wound Open Wounding Event: Pressure Injury Status: Date Acquired: 11/15/2016 Comorbid Anemia, Hypertension, History of pressure Weeks Of Treatment: 14 History: wounds, Rheumatoid Arthritis, Paraplegia Clustered Wound: No Photos Wound Measurements Length: (cm) 14.5 % Reduction i Width: (cm) 10.5 % Reduction i Depth: (cm) 4.5 Epithelializa Area: (cm) 119.577 Tunneling: Volume: (cm) 538.096 Location Maximum Location 2 Positio Maximum n Area: -67.5% n Volume: -50.8% tion: None Yes 1 Distance: (cm) 2.9 n (o'clock): 12 Distance: (cm) 2 Wound Description Classification: Category/Stage IV Foul Odor Aft Wound Margin: Epibole Slough/Fibrin Exudate Amount: Large Exudate Type: Serosanguineous Exudate Color: red, brown er Cleansing: No o Yes Wound Bed Granulation Amount: Large (67-100%) Exposed Structure Granulation Quality: Red Fascia Exposed: No Necrotic Amount: Small (1-33%) Fat Layer (Subcutaneous Tissue) Exposed: Yes Necrotic Quality: Adherent Slough Tendon Exposed: No Muscle Exposed: Yes LANGLEY, INGALLS A. ([id number]) Necrosis of Muscle: No Joint Exposed: No Bone Exposed: No Periwound Skin Texture Texture Color No Abnormalities Noted: No No Abnormalities Noted: No Callus: No Atrophie Blanche: No Crepitus: No Cyanosis: No Excoriation: No Ecchymosis: No Induration: No Erythema: No Rash: No Hemosiderin Staining: No Scarring: No Mottled: No Pallor: No Moisture Rubor: No No Abnormalities Noted: No Dry / Scaly: No Temperature / Pain Maceration: Yes Temperature: No Abnormality Wound Preparation Ulcer  Cleansing: Rinsed/Irrigated with Saline Topical Anesthetic Applied: Other: lidocaine 4%, Assessment Notes There is a tunnel in the center of wound bed tunneling 2.9cm towards 11:00. Treatment Notes Wound #7 (Midline Sacrum) 1. Cleansed with: Clean wound with Dakins/Anasept 2. Anesthetic Topical Lidocaine 4% cream to wound bed prior to debridement 4. Dressing Applied: Other dressing (specify in notes) 5. Secondary  Dressing Applied ABD Pad Notes Dakins soaked gauze, ABD, xsorb, tape Electronic Signature(s) Signed: 04/19/2017 1:31:09 PM By: Gretta Cool, BSN, RN, CWS, Kim RN, BSN Signed: 04/21/2017 2:08:04 PM By: Roger Shelter Previous Signature: 04/18/2017 5:09:04 PM Version By: Gretta Cool, BSN, RN, CWS, Kim RN, BSN Entered By: Roger Shelter on 04/19/2017 08:03:47 [name] ([id number]) -------------------------------------------------------------------------------- [place] Details Patient Name: [place], FRIEDEN A. Date of Service: 04/18/2017 11:00 AM Medical Record Number: [id number] Patient Account Number: [id number] Date of Birth/Sex: 1946-02-16 (71 y.o. Male) Treating RN: Roger Shelter Primary Care Brehanna Deveny: [name] Other Clinician: Referring Asucena Galer: [name] Treating Damya Comley/Extender: Tito Dine in Treatment: 14 Vital Signs Time Taken: 11:02 Temperature (F): 98.1 Height (in): 75 Pulse (bpm): 63 Weight (lbs): 209 Respiratory Rate (breaths/min): 16 Body Mass Index (BMI): 26.1 Blood Pressure (mmHg): 135/64 Reference Range: 80 - 120 mg / dl Electronic Signature(s) Signed: 04/18/2017 4:47:02 PM By: Roger Shelter Entered By: Roger Shelter on 04/18/2017 11:03:07

## 2017-04-26 ENCOUNTER — Inpatient Hospital Stay: Payer: Medicare HMO

## 2017-04-26 VITALS — BP 119/67 | HR 76 | Resp 16

## 2017-04-26 DIAGNOSIS — D649 Anemia, unspecified: Secondary | ICD-10-CM | POA: Diagnosis not present

## 2017-04-26 DIAGNOSIS — D508 Other iron deficiency anemias: Secondary | ICD-10-CM

## 2017-04-26 DIAGNOSIS — Z87891 Personal history of nicotine dependence: Secondary | ICD-10-CM | POA: Diagnosis not present

## 2017-04-26 DIAGNOSIS — Z79899 Other long term (current) drug therapy: Secondary | ICD-10-CM | POA: Diagnosis not present

## 2017-04-26 DIAGNOSIS — N400 Enlarged prostate without lower urinary tract symptoms: Secondary | ICD-10-CM | POA: Diagnosis not present

## 2017-04-26 DIAGNOSIS — D473 Essential (hemorrhagic) thrombocythemia: Secondary | ICD-10-CM | POA: Diagnosis not present

## 2017-04-26 DIAGNOSIS — D72829 Elevated white blood cell count, unspecified: Secondary | ICD-10-CM | POA: Diagnosis not present

## 2017-04-26 DIAGNOSIS — G8220 Paraplegia, unspecified: Secondary | ICD-10-CM | POA: Diagnosis not present

## 2017-04-26 MED ORDER — IRON SUCROSE 20 MG/ML IV SOLN
200.0000 mg | Freq: Once | INTRAVENOUS | Status: AC
  Administered 2017-04-26: 200 mg via INTRAVENOUS
  Filled 2017-04-26: qty 10

## 2017-04-28 DIAGNOSIS — D508 Other iron deficiency anemias: Secondary | ICD-10-CM | POA: Diagnosis not present

## 2017-04-28 DIAGNOSIS — N319 Neuromuscular dysfunction of bladder, unspecified: Secondary | ICD-10-CM | POA: Diagnosis not present

## 2017-04-28 DIAGNOSIS — S24104S Unspecified injury at T11-T12 level of thoracic spinal cord, sequela: Secondary | ICD-10-CM | POA: Diagnosis not present

## 2017-04-28 DIAGNOSIS — K592 Neurogenic bowel, not elsewhere classified: Secondary | ICD-10-CM | POA: Diagnosis not present

## 2017-04-28 DIAGNOSIS — L89154 Pressure ulcer of sacral region, stage 4: Secondary | ICD-10-CM | POA: Diagnosis not present

## 2017-04-28 DIAGNOSIS — I10 Essential (primary) hypertension: Secondary | ICD-10-CM | POA: Diagnosis not present

## 2017-04-28 DIAGNOSIS — G8220 Paraplegia, unspecified: Secondary | ICD-10-CM | POA: Diagnosis not present

## 2017-04-28 DIAGNOSIS — N400 Enlarged prostate without lower urinary tract symptoms: Secondary | ICD-10-CM | POA: Diagnosis not present

## 2017-04-28 DIAGNOSIS — Z933 Colostomy status: Secondary | ICD-10-CM | POA: Diagnosis not present

## 2017-05-01 ENCOUNTER — Inpatient Hospital Stay: Payer: Medicare HMO

## 2017-05-01 DIAGNOSIS — Z79899 Other long term (current) drug therapy: Secondary | ICD-10-CM | POA: Diagnosis not present

## 2017-05-01 DIAGNOSIS — G8220 Paraplegia, unspecified: Secondary | ICD-10-CM | POA: Diagnosis not present

## 2017-05-01 DIAGNOSIS — N400 Enlarged prostate without lower urinary tract symptoms: Secondary | ICD-10-CM | POA: Diagnosis not present

## 2017-05-01 DIAGNOSIS — D649 Anemia, unspecified: Secondary | ICD-10-CM

## 2017-05-01 DIAGNOSIS — D72829 Elevated white blood cell count, unspecified: Secondary | ICD-10-CM | POA: Diagnosis not present

## 2017-05-01 DIAGNOSIS — D473 Essential (hemorrhagic) thrombocythemia: Secondary | ICD-10-CM | POA: Diagnosis not present

## 2017-05-01 DIAGNOSIS — Z87891 Personal history of nicotine dependence: Secondary | ICD-10-CM | POA: Diagnosis not present

## 2017-05-01 DIAGNOSIS — D508 Other iron deficiency anemias: Secondary | ICD-10-CM | POA: Diagnosis not present

## 2017-05-01 LAB — CBC WITH DIFFERENTIAL/PLATELET
BASOS PCT: 0 %
Basophils Absolute: 0.1 10*3/uL (ref 0–0.1)
EOS ABS: 0.3 10*3/uL (ref 0–0.7)
Eosinophils Relative: 3 %
HCT: 30.5 % — ABNORMAL LOW (ref 40.0–52.0)
Hemoglobin: 9.4 g/dL — ABNORMAL LOW (ref 13.0–18.0)
Lymphocytes Relative: 19 %
Lymphs Abs: 2.1 10*3/uL (ref 1.0–3.6)
MCH: 20.2 pg — ABNORMAL LOW (ref 26.0–34.0)
MCHC: 30.9 g/dL — AB (ref 32.0–36.0)
MCV: 65.5 fL — ABNORMAL LOW (ref 80.0–100.0)
MONO ABS: 1 10*3/uL (ref 0.2–1.0)
MONOS PCT: 9 %
Neutro Abs: 7.9 10*3/uL — ABNORMAL HIGH (ref 1.4–6.5)
Neutrophils Relative %: 69 %
Platelets: 540 10*3/uL — ABNORMAL HIGH (ref 150–440)
RBC: 4.66 MIL/uL (ref 4.40–5.90)
RDW: 19.8 % — AB (ref 11.5–14.5)
WBC: 11.4 10*3/uL — ABNORMAL HIGH (ref 3.8–10.6)

## 2017-05-01 LAB — FERRITIN: Ferritin: 296 ng/mL (ref 24–336)

## 2017-05-01 LAB — IRON AND TIBC
IRON: 11 ug/dL — AB (ref 45–182)
SATURATION RATIOS: 5 % — AB (ref 17.9–39.5)
TIBC: 232 ug/dL — ABNORMAL LOW (ref 250–450)
UIBC: 221 ug/dL

## 2017-05-02 NOTE — Progress Notes (Signed)
Hematology/Oncology Follow Up Note [facility] [phone] [phone]  CONSULT NOTE Patient Care Team: [name], MD as PCP - General (Family Medicine)  CHIEF COMPLAINTS/PURPOSE OF CONSULTATION:  I have low blood counts.   HISTORY OF PRESENTING ILLNESS:  Bradley [name] 71 y.o.  male with PMH listed as below who was referred to me for evaluation of iron deficiency anemia.  Patient is paraplegic, also has chronic pressure ulcer, recently just had colostomy. He self catheterizes. Patient feels that his energy levels have been the same. He always has mild fatigue.  He has BPH for which he take finasteride. He has not see his urologist for a while.  He was seen by [facility] for elevated ESR and his chronic hand joint tenderness. He was not considered to have rheumatoid arthritis.   INTERVAL HISTORY Patient presents to follow up for chronic anemia.   He has chronic pressure ulcer, following up with wound care.  His appointment is early December and will have the decision of FLAP procedure. He denies fever, chill. Energy level feels the same. .    ROS:  Review of Systems  Constitutional: Positive for fatigue.  HENT:  Negative.  Negative for lump/mass and mouth sores.   Eyes: Negative.  Negative for eye problems.  Respiratory: Negative.  Negative for chest tightness and cough.   Cardiovascular: Negative.  Negative for chest pain.  Gastrointestinal: Negative.  Negative for abdominal distention and abdominal pain.       Colostomy  Endocrine: Negative.  Negative for hot flashes.  Genitourinary: Negative for hematuria.        Self catherize  Musculoskeletal: Positive for arthralgias. Negative for back pain.       Paraplegic  Skin: Positive for wound. Negative for itching and rash.       Chronic pressure ulcer  Neurological: Negative.  Negative for dizziness.  Hematological: Negative.  Negative for adenopathy.  Psychiatric/Behavioral:  Negative.  The patient is not nervous/anxious.     MEDICAL HISTORY:  Past Medical History:  Diagnosis Date  . Arthritis   . Paralysis of both lower limbs ([place])     SURGICAL HISTORY: Past Surgical History:  Procedure Laterality Date  . COLONOSCOPY  06/06/2014  . TONSILLECTOMY      SOCIAL HISTORY: Social History   Socioeconomic History  . Marital status: Married    Spouse name: Not on file  . Number of children: Not on file  . Years of education: Not on file  . Highest education level: Not on file  Social Needs  . Financial resource strain: Not on file  . Food insecurity - worry: Not on file  . Food insecurity - inability: Not on file  . Transportation needs - medical: Not on file  . Transportation needs - non-medical: Not on file  Occupational History  . Not on file  Tobacco Use  . Smoking status: Former Smoker    Packs/day: 0.50    Years: 5.00    Pack years: 2.50    Last attempt to quit: 1972    Years since quitting: 46.9  . Smokeless tobacco: Never Used  Substance and Sexual Activity  . Alcohol use: No  . Drug use: No  . Sexual activity: Not on file  Other Topics Concern  . Not on file  Social History Narrative  . Not on file    FAMILY HISTORY: Family History  Problem Relation Age of Onset  . Breast cancer Mother     ALLERGIES:  has No Known Allergies.  MEDICATIONS:  Current Outpatient Medications  Medication Sig Dispense Refill  . acetaminophen (TYLENOL) 500 MG tablet Take by mouth.    Marland Kitchen amLODipine (NORVASC) 5 MG tablet Take 5 mg by mouth daily.    . baclofen (LIORESAL) 10 MG tablet Take 20 mg by mouth 3 (three) times daily.    . [employer] ZINC 50 MG TABS Take 50 mg by mouth 2 (two) times daily.    . ferrous sulfate 325 (65 FE) MG tablet Take 325 mg by mouth 2 (two) times daily.    . finasteride (PROSCAR) 5 MG tablet Take 5 mg by mouth daily.    . Multiple Vitamin (MULTI-VITAMINS) TABS Take by mouth.    . polyethylene glycol powder (GLYCOLAX/MIRALAX)  powder Take by mouth.    . vitamin C (ASCORBIC ACID) 500 MG tablet Take 500 mg by mouth 2 (two) times daily.     No current facility-administered medications for this visit.       Marland Kitchen  PHYSICAL EXAMINATION: ECOG PERFORMANCE STATUS: 2 - Symptomatic, <50% confined to bed Vitals:   05/03/17 1343  BP: 124/69  Pulse: 88  Resp: 16  Temp: 98 F (36.7 C)   There were no vitals filed for this visit. GENERAL: No distress, well nourished.  SKIN:  No rashes or significant lesions  HEAD: Normocephalic, No masses, lesions, tenderness or abnormalities  EYES: Conjunctiva are pink, non icteric ENT: External ears normal ,lips , buccal mucosa, and tongue normal and mucous membranes are moist  LYMPH: No palpable cervical and axillary lymphadenopathy  LUNGS: Clear to auscultation, no crackles or wheezes HEART: Regular rate & rhythm, no murmurs, no gallops, S1 normal and S2 normal  ABDOMEN: Abdomen soft, non-tender, normal bowel sounds, I did not appreciate any  masses or organomegaly  MUSCULOSKELETAL:  no tenderness on percussion of the back or rib cage.  EXTREMITIES: Bilateral ulnar deviation of metacarpophalangeal joints and joint tenderness.  NEURO: Alert & oriented, paraplegic LABORATORY DATA:  I have reviewed the data as listed Lab Results  Component Value Date   WBC 11.4 (H) 05/01/2017   HGB 9.4 (L) 05/01/2017   HCT 30.5 (L) 05/01/2017   MCV 65.5 (L) 05/01/2017   PLT 540 (H) 05/01/2017   Recent Labs    02/15/17 1234 04/12/17 1320  NA 138 137  K 3.7 3.4*  CL 102 103  CO2 27 26  GLUCOSE 85 95  BUN 8 11  CREATININE 0.52* 0.47*  CALCIUM 8.9 8.6*  GFRNONAA >60 >60  GFRAA >60 >60  PROT 7.7 7.8  ALBUMIN 2.8* 2.7*  AST 15 16  ALT 9* 8*  ALKPHOS 72 79  BILITOT 0.5 0.4   Iron/TIBC/Ferritin/ %Sat    Component Value Date/Time   IRON 11 (L) 05/01/2017 1337   TIBC 232 (L) 05/01/2017 1337   FERRITIN 296 05/01/2017 1337   IRONPCTSAT 5 (L) 05/01/2017 1337   Negative stool occult.    ASSESSMENT & PLAN:  1. Microcytic anemia   2. Other iron deficiency anemia   3. Anemia, unspecified type   4. Thrombocytosis (HCC)   5. Leukocytosis, unspecified type    # His microcytic anemia is likely multifactorial, possibly a combination of iron deficiency and chronic inflammation, and also possible alpha thalassemia. .He has always been microcytic in previous labs.   Ferritin increased after 2 doses of Venofer, low serum iron and low iron saturation, more consistent with anemia of chronic inflammation, likely from chronic sacral wound.   # Thrombocytosis  and leukocytosis, ? Reactive to chronic inflammation. I recommend a bone marrow biopsy to evaluate if any underlying bone marrow diseases. Check BCR-ABL, JAK2 mutation prior to next visit Patient prefers to be watched at this time as he needs to make decision of having FLAP procedure. If he changes mind, he will let me know.   All questions were answered. The patient knows to call the clinic with any problems questions or concerns.  Return of visit: 6 weeks for re-evaluation and repeat labs  Earlie Server, MD, PhD Hematology Oncology [facility] Pager- [phone] 05/02/2017

## 2017-05-03 ENCOUNTER — Inpatient Hospital Stay (HOSPITAL_BASED_OUTPATIENT_CLINIC_OR_DEPARTMENT_OTHER): Payer: Medicare HMO | Admitting: Oncology

## 2017-05-03 ENCOUNTER — Encounter: Payer: Self-pay | Admitting: Oncology

## 2017-05-03 VITALS — BP 124/69 | HR 88 | Temp 98.0°F | Resp 16

## 2017-05-03 DIAGNOSIS — D75839 Thrombocytosis, unspecified: Secondary | ICD-10-CM

## 2017-05-03 DIAGNOSIS — D649 Anemia, unspecified: Secondary | ICD-10-CM

## 2017-05-03 DIAGNOSIS — D72829 Elevated white blood cell count, unspecified: Secondary | ICD-10-CM

## 2017-05-03 DIAGNOSIS — D473 Essential (hemorrhagic) thrombocythemia: Secondary | ICD-10-CM

## 2017-05-03 DIAGNOSIS — Z79899 Other long term (current) drug therapy: Secondary | ICD-10-CM | POA: Diagnosis not present

## 2017-05-03 DIAGNOSIS — N400 Enlarged prostate without lower urinary tract symptoms: Secondary | ICD-10-CM | POA: Diagnosis not present

## 2017-05-03 DIAGNOSIS — Z87891 Personal history of nicotine dependence: Secondary | ICD-10-CM | POA: Diagnosis not present

## 2017-05-03 DIAGNOSIS — D508 Other iron deficiency anemias: Secondary | ICD-10-CM | POA: Diagnosis not present

## 2017-05-03 DIAGNOSIS — G8220 Paraplegia, unspecified: Secondary | ICD-10-CM

## 2017-05-03 DIAGNOSIS — D509 Iron deficiency anemia, unspecified: Secondary | ICD-10-CM

## 2017-05-03 NOTE — Progress Notes (Signed)
Patient here for follow up with lab results today. He states that he is feeling well except for some arthritis pain in his hands.

## 2017-05-08 DIAGNOSIS — S24104S Unspecified injury at T11-T12 level of thoracic spinal cord, sequela: Secondary | ICD-10-CM | POA: Diagnosis not present

## 2017-05-08 DIAGNOSIS — D508 Other iron deficiency anemias: Secondary | ICD-10-CM | POA: Diagnosis not present

## 2017-05-08 DIAGNOSIS — L89154 Pressure ulcer of sacral region, stage 4: Secondary | ICD-10-CM | POA: Diagnosis not present

## 2017-05-08 DIAGNOSIS — N400 Enlarged prostate without lower urinary tract symptoms: Secondary | ICD-10-CM | POA: Diagnosis not present

## 2017-05-08 DIAGNOSIS — Z933 Colostomy status: Secondary | ICD-10-CM | POA: Diagnosis not present

## 2017-05-08 DIAGNOSIS — I10 Essential (primary) hypertension: Secondary | ICD-10-CM | POA: Diagnosis not present

## 2017-05-08 DIAGNOSIS — N319 Neuromuscular dysfunction of bladder, unspecified: Secondary | ICD-10-CM | POA: Diagnosis not present

## 2017-05-08 DIAGNOSIS — K592 Neurogenic bowel, not elsewhere classified: Secondary | ICD-10-CM | POA: Diagnosis not present

## 2017-05-08 DIAGNOSIS — G8220 Paraplegia, unspecified: Secondary | ICD-10-CM | POA: Diagnosis not present

## 2017-05-10 DIAGNOSIS — Z01818 Encounter for other preprocedural examination: Secondary | ICD-10-CM | POA: Diagnosis not present

## 2017-05-10 DIAGNOSIS — M869 Osteomyelitis, unspecified: Secondary | ICD-10-CM | POA: Diagnosis not present

## 2017-05-12 DIAGNOSIS — D508 Other iron deficiency anemias: Secondary | ICD-10-CM | POA: Diagnosis not present

## 2017-05-12 DIAGNOSIS — I10 Essential (primary) hypertension: Secondary | ICD-10-CM | POA: Diagnosis not present

## 2017-05-12 DIAGNOSIS — G8220 Paraplegia, unspecified: Secondary | ICD-10-CM | POA: Diagnosis not present

## 2017-05-12 DIAGNOSIS — L89154 Pressure ulcer of sacral region, stage 4: Secondary | ICD-10-CM | POA: Diagnosis not present

## 2017-05-12 DIAGNOSIS — S24104S Unspecified injury at T11-T12 level of thoracic spinal cord, sequela: Secondary | ICD-10-CM | POA: Diagnosis not present

## 2017-05-12 DIAGNOSIS — N319 Neuromuscular dysfunction of bladder, unspecified: Secondary | ICD-10-CM | POA: Diagnosis not present

## 2017-05-12 DIAGNOSIS — K592 Neurogenic bowel, not elsewhere classified: Secondary | ICD-10-CM | POA: Diagnosis not present

## 2017-05-12 DIAGNOSIS — Z933 Colostomy status: Secondary | ICD-10-CM | POA: Diagnosis not present

## 2017-05-12 DIAGNOSIS — N400 Enlarged prostate without lower urinary tract symptoms: Secondary | ICD-10-CM | POA: Diagnosis not present

## 2017-05-17 ENCOUNTER — Encounter: Payer: Medicare HMO | Attending: Internal Medicine | Admitting: Internal Medicine

## 2017-05-17 DIAGNOSIS — L89314 Pressure ulcer of right buttock, stage 4: Secondary | ICD-10-CM | POA: Diagnosis not present

## 2017-05-17 DIAGNOSIS — L89150 Pressure ulcer of sacral region, unstageable: Secondary | ICD-10-CM | POA: Diagnosis not present

## 2017-05-17 DIAGNOSIS — L89154 Pressure ulcer of sacral region, stage 4: Secondary | ICD-10-CM | POA: Diagnosis not present

## 2017-05-17 DIAGNOSIS — M4628 Osteomyelitis of vertebra, sacral and sacrococcygeal region: Secondary | ICD-10-CM | POA: Diagnosis not present

## 2017-05-17 DIAGNOSIS — L89324 Pressure ulcer of left buttock, stage 4: Secondary | ICD-10-CM | POA: Diagnosis not present

## 2017-05-17 DIAGNOSIS — M8668 Other chronic osteomyelitis, other site: Secondary | ICD-10-CM | POA: Diagnosis not present

## 2017-05-19 DIAGNOSIS — N400 Enlarged prostate without lower urinary tract symptoms: Secondary | ICD-10-CM | POA: Diagnosis not present

## 2017-05-19 DIAGNOSIS — G8220 Paraplegia, unspecified: Secondary | ICD-10-CM | POA: Diagnosis not present

## 2017-05-19 DIAGNOSIS — Z933 Colostomy status: Secondary | ICD-10-CM | POA: Diagnosis not present

## 2017-05-19 DIAGNOSIS — I10 Essential (primary) hypertension: Secondary | ICD-10-CM | POA: Diagnosis not present

## 2017-05-19 DIAGNOSIS — N319 Neuromuscular dysfunction of bladder, unspecified: Secondary | ICD-10-CM | POA: Diagnosis not present

## 2017-05-19 DIAGNOSIS — L89154 Pressure ulcer of sacral region, stage 4: Secondary | ICD-10-CM | POA: Diagnosis not present

## 2017-05-19 DIAGNOSIS — D508 Other iron deficiency anemias: Secondary | ICD-10-CM | POA: Diagnosis not present

## 2017-05-19 DIAGNOSIS — S24104S Unspecified injury at T11-T12 level of thoracic spinal cord, sequela: Secondary | ICD-10-CM | POA: Diagnosis not present

## 2017-05-19 DIAGNOSIS — K592 Neurogenic bowel, not elsewhere classified: Secondary | ICD-10-CM | POA: Diagnosis not present

## 2017-05-20 NOTE — Progress Notes (Signed)
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 The patient also states that Dr. Myriam Jacobson wanted to reconsider a wound VAC. I really don't see this as a viable option at least not in the outpatient setting. The wound itself is frankly to close to his anal opening to maintain a seal. The last time we tried to do this home health was unable to manage it. It might be possible to maintain a wound VAC in this setting outside of the home such as a skilled nursing facility or an LTAC however I am doubtful about this even in that setting Objective Constitutional Sitting or standing Blood Pressure is within target range for patient.. Pulse regular and within target range for patient.Marland Kitchen Respirations regular, non-labored and within target range.. Temperature is normal and within the target range for the patient.Marland Kitchen appears in no distress. He does not appear to be acutely medically L. Vitals Time Taken: 10:48 AM, Height: 75 in, Weight: 209 lbs, BMI: 26.1, Temperature: 97.8 F, Pulse: 79 bpm, Respiratory Rate: 16 breaths/min, Blood Pressure: 125/54 mmHg. Eyes Conjunctivae clear. No discharge. Respiratory Respiratory effort is easy and symmetric bilaterally. Rate is normal at rest and on room air.. Bilateral breath sounds are clear and equal in all lobes with no wheezes, rales or rhonchi.. Cardiovascular Heart rhythm and rate regular, without murmur or gallop.. Gastrointestinal (GI) his colostomy noted there is no other masses. Psychiatric No evidence of depression, anxiety, or agitation. Calm, cooperative, and communicative. Appropriate interactions and affect.. General Notes: wound exam; substantial wound in the lower sacrum and surrounding soft tissue including his bilateral buttocks with very close approximation to the anal opening at the inferior margins. In the center part of this there is now large amounts of exposed bone, this is  worse than last time. Also concerning is the fact on the upper right buttock superior and lateral to the woundthere appears to be away swelling although there is no  warmth and no obvious fluid. There is no overt connection with the wound however all of this appears to be somewhat concerning A second problem today is that the patient now has a second wound at roughly the L3 level up. This required debridement with a #5 curet hemostasis with direct pressure we remove necrotic surface material but even after debridement I wasn't able to get down to a viable surface here. Integumentary (Hair, Skin) in the course of general physical exam I discovered another wound at roughly L3 circular wound with necrotic debris over the surface. this required debridement with a #5 curet. Necrotic material was removed the surface of the wound however I cannot get down to a completely viable base. Wound #7 status is Open. Original cause of wound was Pressure Injury. The wound is located on the Midline Sacrum. The wound measures 14.5cm length x 10.5cm width x 4cm depth; 119.577cm^2 area and 478.307cm^3 volume. There is bone, Meech, Diaz A. ([id number]) muscle, Fat Layer (Subcutaneous Tissue) Exposed, and fascia exposed. There is a large amount of serosanguineous drainage noted. The wound margin is epibole. There is large (67-100%) red granulation within the wound bed. There is a small (1-33%) amount of necrotic tissue within the wound bed including Adherent Slough. The periwound skin appearance exhibited: Maceration. The periwound skin appearance did not exhibit: Callus, Crepitus, Excoriation, Induration, Rash, Scarring, Dry/Scaly, Atrophie Blanche, Cyanosis, Ecchymosis, Hemosiderin Staining, Mottled, Pallor, Rubor, Erythema. Periwound temperature was noted as No Abnormality. Wound #8 status is Open. Original cause of wound was Pressure Injury. The wound is located on the Midline Lumbar spine. The wound measures 1.9cm  length x 2cm width x 0.1cm depth; 2.985cm^2 area and 0.298cm^3 volume. There is no tunneling or undermining noted. There is a none present amount of drainage noted. The wound margin is flat and intact. There is no granulation within the wound bed. There is a large (67-100%) amount of necrotic tissue within the wound bed including Eschar. The periwound skin appearance did not exhibit: Callus, Crepitus, Excoriation, Induration, Rash, Scarring, Dry/Scaly, Maceration, Atrophie Blanche, Cyanosis, Ecchymosis, Hemosiderin Staining, Mottled, Pallor, Rubor, Erythema. 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 There was a Skin/Subcutaneous Tissue Debridement ([id number]) debridement with total area of 3.8 sq cm performed by Ricard Dillon, MD. with the following instrument(s): Curette to remove Viable and Non-Viable tissue/material including Exudate, Fibrin/Slough, and Subcutaneous after achieving pain control using Lidocaine 4% Topical Solution. A time out was conducted at 11:15, prior to the start of the procedure. A Minimum amount of bleeding was controlled with Pressure. The procedure was tolerated well with a pain level of 0 throughout and a pain level of 0 following the procedure. Post Debridement Measurements: 1.9cm length x 2cm width x 0.2cm depth; 0.597cm^3 volume. Post debridement Stage noted as Unstageable/Unclassified. Character of Wound/Ulcer Post Debridement requires further debridement. Post procedure Diagnosis Wound #8: Same as Pre-Procedure Plan Wound Cleansing: Wound #7 Midline Sacrum: Clean wound with Normal Saline. Cleanse wound with mild soap and water Wound #8 Midline Lumbar spine: Clean wound with Normal Saline. Cleanse wound with mild soap and  water Bradley [name], Bradley A. ([id number]) Anesthetic: Topical Lidocaine 4% cream applied to wound bed prior to debridement - for clinic use Wound #7 Midline Sacrum: Topical Lidocaine 4% cream applied to wound bed prior to debridement -  for clinic use Skin Barriers/Peri-Wound Care: Wound #7 Midline Sacrum: Skin Prep Wound #8 Midline Lumbar spine: Skin Prep Primary Wound Dressing: Wound #7 Midline Sacrum: Saline moistened gauze Wound #8 Midline Lumbar spine: Silvercel Non-Adherent - PATIENT CANNOT CHANGE THIS BANDAGE HIMSELF SO [facility]N WILL HAVE TO MAKE VISITS TWICE WEEKLY WHEN PATIENT IS SEEN AT [place] AND THREE TIMES WEEKLY WHEN PATIENT IS NOT SEEN IN CLINIC Secondary Dressing: Wound #7 Midline Sacrum: ABD pad XtraSorb Wound #8 Midline Lumbar spine: Boardered Foam Dressing - PATIENT CANNOT CHANGE THIS BANDAGE HIMSELF SO [facility]HRN WILL HAVE TO MAKE VISITS TWICE WEEKLY WHEN PATIENT IS SEEN AT [place] AND THREE TIMES WEEKLY WHEN PATIENT IS NOT SEEN IN CLINIC Dressing Change Frequency: Wound #7 Midline Sacrum: Change dressing every day. Wound #8 Midline Lumbar spine: Change Dressing Monday, Wednesday, Friday - PATIENT CANNOT CHANGE THIS BANDAGE HIMSELF SO HHRN WILL HAVE TO MAKE VISITS TWICE WEEKLY WHEN PATIENT IS SEEN AT [place] AND THREE TIMES WEEKLY WHEN PATIENT IS NOT SEEN IN CLINIC Follow-up Appointments: Return Appointment in 2 weeks. Off-Loading: Wound #7 Midline Sacrum: Turn and reposition every 2 hours Wound #8 Midline Lumbar spine: Turn and reposition every 2 hours Additional Orders / Instructions: Wound #7 Midline Sacrum: Increase protein intake. - Eat more and use protein supplements. (Ensure or Boost) Wound #8 Midline Lumbar spine: Increase protein intake. - Eat more and use protein supplements. (Ensure or Boost) Home Health: Wound #7 Midline Sacrum: Continue Home Health Visits - [facility] - PATIENT [place] ON HIS BACK BY HIMSELF SO [place] WILL HAVE TO MAKE VISITS TWICE WEEKLY WHEN PATIENT IS SEEN AT [place] AND THREE TIMES WEEKLY WHEN PATIENT IS NOT SEEN [place] Nurse may visit PRN to address patient s wound care needs. FACE TO FACE ENCOUNTER: MEDICARE and MEDICAID PATIENTS: I certify that this patient is under my care and that I had a face-to-face encounter that meets the physician face-to-face encounter requirements with this patient on this date. The encounter with the patient was in whole or in part for the following MEDICAL CONDITION: (primary reason for [place]) MEDICAL NECESSITY: I certify, that based on my findings, NURSING services are a medically necessary home health service. HOME BOUND STATUS: I certify that my clinical findings support that this patient is homebound (i.e., Due to illness or injury, pt requires aid of supportive devices such as crutches, cane, wheelchairs, walkers, the use of special transportation or the assistance of another person to leave their place of residence. There is a normal inability to leave the home and doing so requires considerable and taxing effort. Other absences are for medical reasons / religious services and are infrequent or of short duration when for other reasons). If current dressing causes regression in wound condition, may D/C ordered dressing product/s and apply Normal Saline Moist Dressing daily until next [place] / Other MD appointment. [place] of regression in [place] ([id number]) wound condition at [phone]. Please direct any NON-WOUND related issues/requests for orders to patient's Primary Care Physician Wound #8 Midline Lumbar spine: Continue Home Health Visits - [facility] - PATIENT [place] ON HIS BACK BY HIMSELF SO [place] WILL HAVE TO MAKE VISITS TWICE WEEKLY WHEN PATIENT IS SEEN AT [place] AND THREE TIMES WEEKLY  WHEN PATIENT IS NOT [place] Nurse may visit PRN to address patient s wound care  needs. FACE TO FACE ENCOUNTER: MEDICARE and MEDICAID PATIENTS: I certify that this patient is under my care and that I had a face-to-face encounter that meets the physician face-to-face encounter requirements with this patient on this date. The encounter with the patient was in whole or in part for the following MEDICAL CONDITION: (primary reason for [place]) MEDICAL NECESSITY: I certify, that based on my findings, NURSING services are a medically necessary home health service. HOME BOUND STATUS: I certify that my clinical findings support that this patient is homebound (i.e., Due to illness or injury, pt requires aid of supportive devices such as crutches, cane, wheelchairs, walkers, the use of special transportation or the assistance of another person to leave their place of residence. There is a normal inability to leave the home and doing so requires considerable and taxing effort. Other absences are for medical reasons / religious services and are infrequent or of short duration when for other reasons). If current dressing causes regression in wound condition, may D/C ordered dressing product/s and apply Normal Saline Moist Dressing daily until next [place] / Other MD appointment. [place] of regression in wound condition at [phone]. Please direct any NON-WOUND related issues/requests for orders to patient's Primary Care Physician The following medication(s) was prescribed: lidocaine topical 4 % cream 1 1 cream topical starting 05/17/2017 #1 for now I think the best thing to do is to wait for the bone biopsy results that was done at plastic surgery at [facility] #2 I think the patient will need a repeat MRI of the pelvis and additional lab work. I'm wondering of plastic surgery is going to take care of all of this and whether we will need to have any  involvement or not. I will see him back in 2 weeks. #3 for now we continued the Dakin's wet-to-dry to the patient's large initial woundwhich admittedly looks somewhat worse today with more exposed bone centrally #4I will use silver alginate to the newly discovered wound at roughly the L2-L3 level. This is likely to require further debridement however I did not initiate this today. I would like to follow him up in 2 weeks versus the usual one-month follow- up.this is likely to be a pressure area. #5 I do not see an option for a wound VAC to this extensive area at least not in the outpatient arena. However, even in the world of skilled facilities it would take a special facility with a special individual to maintain a wound VAC in this area Electronic Signature(s) Signed: 05/17/2017 5:01:55 PM By: Linton Ham MD Entered By: Linton Ham on 05/17/2017 12:07:43 [name] ([id number]) -------------------------------------------------------------------------------- [place] Details Patient Name: Bradley Loa A. Date of Service: 05/17/2017 Medical Record Number: [id number] Patient Account Number: [id number] Date of Birth/Sex: Sep 18, 1945 (71 y.o. 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 with other specified se Modifier: verity Quantity: 1 Physician Procedures CPT4 Bradley:  [id number] Description: 67591 - WC PHYS LEVEL 3 - EST PT ICD-10 Diagnosis Description L89.154 Pressure ulcer of  sacral region, stage 4 L98.428 Non-pressure chronic ulcer of back with other specified sev Modifier: 25 erity Quantity: 1 CPT4 Bradley: [id number] Description: 11042 - WC PHYS SUBQ TISS 20 SQ CM ICD-10 Diagnosis Description L89.154 Pressure ulcer of sacral region, stage 4 L98.428 Non-pressure chronic ulcer of back with other specified sev Modifier: erity Quantity: 1 Electronic Signature(s) Signed: 05/17/2017 5:01:55 PM By: Linton Ham MD Entered By: Linton Ham on 05/17/2017 12:08:15

## 2017-05-22 DIAGNOSIS — G8220 Paraplegia, unspecified: Secondary | ICD-10-CM | POA: Diagnosis not present

## 2017-05-22 DIAGNOSIS — N400 Enlarged prostate without lower urinary tract symptoms: Secondary | ICD-10-CM | POA: Diagnosis not present

## 2017-05-22 DIAGNOSIS — Z933 Colostomy status: Secondary | ICD-10-CM | POA: Diagnosis not present

## 2017-05-22 DIAGNOSIS — I10 Essential (primary) hypertension: Secondary | ICD-10-CM | POA: Diagnosis not present

## 2017-05-22 DIAGNOSIS — S24104S Unspecified injury at T11-T12 level of thoracic spinal cord, sequela: Secondary | ICD-10-CM | POA: Diagnosis not present

## 2017-05-22 DIAGNOSIS — D508 Other iron deficiency anemias: Secondary | ICD-10-CM | POA: Diagnosis not present

## 2017-05-22 DIAGNOSIS — K592 Neurogenic bowel, not elsewhere classified: Secondary | ICD-10-CM | POA: Diagnosis not present

## 2017-05-22 DIAGNOSIS — N319 Neuromuscular dysfunction of bladder, unspecified: Secondary | ICD-10-CM | POA: Diagnosis not present

## 2017-05-22 DIAGNOSIS — L89154 Pressure ulcer of sacral region, stage 4: Secondary | ICD-10-CM | POA: Diagnosis not present

## 2017-05-23 NOTE — Progress Notes (Addendum)
JACOBE, STUDY ([id number]) Visit Report for 05/17/2017 Arrival Information Details Patient Name: Bradley [name], Bradley [name]. Date of Service: 05/17/2017 11:00 AM Medical Record Number: [id number] Patient Account Number: [id number] Date of Birth/Sex: 1946-02-07 (71 y.o. Male) Treating RN: Montey Hora Primary Care Dhilan Brauer: [name] Other Clinician: Referring Sinclaire Artiga: [name] Treating Alaze Garverick/Extender: Tito Dine in Treatment: 18 Visit Information History Since Last Visit All ordered tests and consults were completed: No Patient Arrived: Wheel Chair Added or deleted any medications: No Arrival Time: 10:57 Any new allergies or adverse reactions: No Accompanied By: self Had a fall or experienced change in No activities of daily living that may affect Transfer Assistance: Harrel Lemon Lift risk of falls: Patient Identification Verified: Yes Signs or symptoms of abuse/neglect since last visito No Secondary Verification Process Completed: Yes Hospitalized since last visit: No Patient Requires Transmission-Based No Has Dressing in Place as Prescribed: Yes Precautions: Pain Present Now: No Patient Has Alerts: No Electronic Signature(s) Signed: 05/17/2017 5:01:45 PM By: Montey Hora Entered By: Montey Hora on 05/17/2017 10:57:45 [name] ([id number]) -------------------------------------------------------------------------------- Encounter Discharge Information Details Patient Name: Bradley Loa A. Date of Service: 05/17/2017 11:00 AM Medical Record Number: [id number] Patient Account Number: [id number] Date of Birth/Sex: 02/24/1946 (71 y.o. Male) Treating RN: Montey Hora Primary Care Riyansh Gerstner: [name] Other Clinician: Referring Sally-Ann Cutbirth: [name] Treating Jaeline [name]/Extender: Tito Dine in Treatment: 36 Encounter Discharge Information Items Discharge Pain Level: 0 Discharge Condition: Stable Ambulatory Status:  Wheelchair Discharge Destination: Home Transportation: Other Accompanied By: self Schedule Follow-up Appointment: Yes Medication Reconciliation completed and No provided to Patient/Care Osher Oettinger: Provided on Clinical Summary of Care: 05/17/2017 Form Type Recipient Paper Patient JG Electronic Signature(s) Signed: 05/23/2017 11:15:46 AM By: Ruthine Dose Entered By: Ruthine Dose on 05/17/2017 11:40:06 [name] ([id number]) -------------------------------------------------------------------------------- Lower Extremity Assessment Details Patient Name: Bradley Loa A. Date of Service: 05/17/2017 11:00 AM Medical Record Number: [id number] Patient Account Number: [id number] Date of Birth/Sex: 1945/06/16 (71 y.o. Male) Treating RN: Montey Hora Primary Care Azriel Jakob: [name] Other Clinician: Referring Gayleen Sholtz: [name] Treating Tawsha Terrero/Extender: Ricard Dillon Weeks in Treatment: 18 Electronic Signature(s) Signed: 05/17/2017 5:01:45 PM By: Montey Hora Entered By: Montey Hora on 05/17/2017 11:02:23 [name] ([id number]) -------------------------------------------------------------------------------- Multi Wound Chart Details Patient Name: Bradley [name], Bradley A. Date of Service: 05/17/2017 11:00 AM Medical Record Number: [id number] Patient Account Number: [id number] Date of Birth/Sex: May 24, 1946 (71 y.o. Male) Treating RN: Montey Hora Primary Care Sarah-Jane Nazario: [name] Other Clinician: Referring Shandiin Eisenbeis: [name] Treating Tiffeny Minchew/Extender: Tito Dine in Treatment: 18 Vital Signs Height(in): 75 Pulse(bpm): 63 Weight(lbs): 209 Blood Pressure(mmHg): 125/54 Body Mass Index(BMI): 26 Temperature(F): 97.8 Respiratory Rate 16 (breaths/min): Photos: [7:No Photos] [8:No Photos] [N/A:N/A] Wound Location: [7:Sacrum - Midline] [8:Midline Lumbar spine] [N/A:N/A] Wounding Event: [7:Pressure Injury] [8:Pressure  Injury] [N/A:N/A] Primary Etiology: [7:Pressure Ulcer] [8:Pressure Ulcer] [N/A:N/A] Comorbid History: [7:Anemia, Hypertension, History of pressure wounds, Rheumatoid Arthritis, Paraplegia] [8:Anemia, Hypertension, History of pressure wounds, Rheumatoid Arthritis, Paraplegia] [N/A:N/A] Date Acquired: [7:11/15/2016] [8:05/17/2017] [N/A:N/A] Weeks of Treatment: [7:18] [8:0] [N/A:N/A] Wound Status: [7:Open] [8:Open] [N/A:N/A] Measurements L x W x D [7:14.5x10.5x4] [8:1.9x2x0.1] [N/A:N/A] (cm) Area (cm) : [7:119.577] [8:2.985] [N/A:N/A] Volume (cm) : [7:478.307] [8:0.298] [N/A:N/A] % Reduction in Area: [7:-67.50%] [8:5.00%] [N/A:N/A] % Reduction in Volume: [7:-34.00%] [8:5.10%] [N/A:N/A] Classification: [7:Category/Stage IV] [8:Unstageable/Unclassified] [N/A:N/A] Exudate Amount: [7:Large] [8:None Present] [N/A:N/A] Exudate Type: [7:Serosanguineous] [8:N/A] [N/A:N/A] Exudate Color: [7:red, brown] [8:N/A] [N/A:N/A] Wound Margin: [7:Epibole] [8:Flat and Intact] [N/A:N/A] Granulation Amount: [7:Large (67-100%)] [8:None Present (0%)] [N/A:N/A] Granulation Quality: [  7:Red] [8:N/A] [N/A:N/A] Necrotic Amount: [7:Small (1-33%)] [8:Large (67-100%)] [N/A:N/A] Necrotic Tissue: [7:Adherent Slough] [8:Eschar] [N/A:N/A] Exposed Structures: [7:Fascia: Yes Fat Layer (Subcutaneous Tissue) Exposed: Yes Muscle: Yes Bone: Yes Tendon: No Joint: No] [8:Fascia: No Fat Layer (Subcutaneous Tissue) Exposed: No Tendon: No Muscle: No Joint: No Bone: No] [N/A:N/A] Epithelialization: [7:None] [8:None] [N/A:N/A] Debridement: [7:N/A] [8:Debridement ([id number])] [N/A:N/A] Pre-procedure [7:N/A] [8:11:15] [N/A:N/A] Verification/Time Out Taken: Bradley [name], Bradley [name] ([id number]) Pain Control: N/A Lidocaine 4% Topical Solution N/A Tissue Debrided: N/A Fibrin/Slough, Exudates, N/A Subcutaneous Level: N/A Skin/Subcutaneous Tissue N/A Debridement Area (sq cm): N/A 3.8 N/A Instrument: N/A Curette N/A Bleeding: N/A Minimum  N/A Hemostasis Achieved: N/A Pressure N/A Procedural Pain: N/A 0 N/A Post Procedural Pain: N/A 0 N/A Debridement Treatment N/A Procedure was tolerated well N/A Response: Post Debridement N/A 1.9x2x0.2 N/A Measurements L x W x D (cm) Post Debridement Volume: N/A 0.597 N/A (cm) Post Debridement Stage: N/A Unstageable/Unclassified N/A Periwound Skin Texture: Excoriation: No Excoriation: No N/A Induration: No Induration: No Callus: No Callus: No Crepitus: No Crepitus: No Rash: No Rash: No Scarring: No Scarring: No Periwound Skin Moisture: Maceration: Yes Maceration: No N/A Dry/Scaly: No Dry/Scaly: No Periwound Skin Color: Atrophie Blanche: No Atrophie Blanche: No N/A Cyanosis: No Cyanosis: No Ecchymosis: No Ecchymosis: No Erythema: No Erythema: No Hemosiderin Staining: No Hemosiderin Staining: No Mottled: No Mottled: No Pallor: No Pallor: No Rubor: No Rubor: No Temperature: No Abnormality N/A N/A Tenderness on Palpation: No No N/A Wound Preparation: Ulcer Cleansing: N/A N/A Rinsed/Irrigated with Saline Topical Anesthetic Applied: Other: lidocaine 4% Procedures Performed: N/A Debridement N/A Treatment Notes Wound #7 (Midline Sacrum) 1. Cleansed with: Clean wound with Normal Saline 2. Anesthetic Topical Lidocaine 4% cream to wound bed prior to debridement 3. Peri-wound Care: Skin Prep 4. Dressing Applied: Saline moistened guaze 5. Secondary Dressing Applied ABD Pad Notes Bradley [name], Bradley A. ([id number]) ABD, xsorb, tape Wound #8 (Midline Lumbar spine) 1. Cleansed with: Clean wound with Normal Saline 3. Peri-wound Care: Skin Prep 4. Dressing Applied: Other dressing (specify in notes) 5. Secondary Dressing Applied Bordered Foam Dressing Notes silvercel Electronic Signature(s) Signed: 05/17/2017 5:01:55 PM By: Linton Ham MD Entered By: Linton Ham on 05/17/2017 11:43:42 [name]  ([id number]) -------------------------------------------------------------------------------- Independence Details Patient Name: CINCERE, ZORN A. Date of Service: 05/17/2017 11:00 AM Medical Record Number: [id number] Patient Account Number: [id number] Date of Birth/Sex: 1945/12/11 (71 y.o. Male) Treating RN: Montey Hora Primary Care Shekita Boyden: [name] Other Clinician: Referring Jasnoor Trussell: [name] Treating Elai Vanwyk/Extender: Tito Dine in Treatment: 66 Active Inactive Electronic Signature(s) Signed: 06/16/2017 10:29:01 AM By: Gretta Cool, BSN, RN, CWS, Kim RN, BSN Signed: 06/28/2017 4:21:33 PM By: Montey Hora Previous Signature: 05/17/2017 5:01:45 PM Version By: Montey Hora Entered By: Gretta Cool BSN, RN, CWS, Kim on 06/16/2017 10:29:00 [name] ([id number]) -------------------------------------------------------------------------------- Pain Assessment Details Patient Name: RAYDEN, DOCK A. Date of Service: 05/17/2017 11:00 AM Medical Record Number: [id number] Patient Account Number: [id number] Date of Birth/Sex: 05-02-1946 (71 y.o. Male) Treating RN: Montey Hora Primary Care Wilmer Santillo: [name] Other Clinician: Referring Selenia Mihok: [name] Treating Krithi Bray/Extender: Tito Dine in Treatment: 18 Active Problems Location of Pain Severity and Description of Pain Patient Has Paino No Site Locations Pain Management and Medication Current Pain Management: Electronic Signature(s) Signed: 05/17/2017 5:01:45 PM By: Montey Hora Entered By: Montey Hora on 05/17/2017 10:58:02 [name] ([id number]) -------------------------------------------------------------------------------- Patient/Caregiver Education Details Patient Name: Bradley Loa A. Date of Service: 05/17/2017 11:00 AM Medical Record Number: [id number] Patient Account Number: [id number] Date of Birth/Gender:  27-Aug-1945 (71 y.o.  Male) Treating RN: Montey Hora Primary Care Physician: [name] Other Clinician: Referring Physician: [name] Treating Physician/Extender: Tito Dine in Treatment: 30 Education Assessment Education Provided To: Patient Education Topics Provided Wound/Skin Impairment: Handouts: Other: change dressing as ordered Methods: Demonstration, Explain/Verbal Responses: State content correctly Electronic Signature(s) Signed: 05/17/2017 5:01:45 PM By: Montey Hora Entered By: Montey Hora on 05/17/2017 11:29:52 [name] ([id number]) -------------------------------------------------------------------------------- Wound Assessment Details Patient Name: Bradley Loa A. Date of Service: 05/17/2017 11:00 AM Medical Record Number: [id number] Patient Account Number: [id number] Date of Birth/Sex: 1946-04-05 (71 y.o. Male) Treating RN: Montey Hora Primary Care Annalyn Blecher: [name] Other Clinician: Referring Kacia Halley: [name] Treating Encarnacion Scioneaux/Extender: Ricard Dillon Weeks in Treatment: 18 Wound Status Wound Number: 7 Primary Pressure Ulcer Etiology: Wound Location: Sacrum - Midline Wound Open Wounding Event: Pressure Injury Status: Date Acquired: 11/15/2016 Comorbid Anemia, Hypertension, History of pressure Weeks Of Treatment: 18 History: wounds, Rheumatoid Arthritis, Paraplegia Clustered Wound: No Photos Photo Uploaded By: Montey Hora on 05/17/2017 13:12:40 Wound Measurements Length: (cm) 14.5 Width: (cm) 10.5 Depth: (cm) 4 Area: (cm) 119.577 Volume: (cm) 478.307 % Reduction in Area: -67.5% % Reduction in Volume: -34% Epithelialization: None Wound Description Classification: Category/Stage IV Wound Margin: Epibole Exudate Amount: Large Exudate Type: Serosanguineous Exudate Color: red, brown Foul Odor After Cleansing: No Slough/Fibrino Yes Wound Bed Granulation Amount: Large (67-100%) Exposed  Structure Granulation Quality: Red Fascia Exposed: Yes Necrotic Amount: Small (1-33%) Fat Layer (Subcutaneous Tissue) Exposed: Yes Necrotic Quality: Adherent Slough Tendon Exposed: No Muscle Exposed: Yes Necrosis of Muscle: No Joint Exposed: No Bone Exposed: Yes Periwound Skin Texture Bradley [name], Bradley A. ([id number]) Texture Color No Abnormalities Noted: No No Abnormalities Noted: No Callus: No Atrophie Blanche: No Crepitus: No Cyanosis: No Excoriation: No Ecchymosis: No Induration: No Erythema: No Rash: No Hemosiderin Staining: No Scarring: No Mottled: No Pallor: No Moisture Rubor: No No Abnormalities Noted: No Dry / Scaly: No Temperature / Pain Maceration: Yes Temperature: No Abnormality Wound Preparation Ulcer Cleansing: Rinsed/Irrigated with Saline Topical Anesthetic Applied: Other: lidocaine 4%, Electronic Signature(s) Signed: 05/17/2017 5:01:45 PM By: Montey Hora Entered By: Montey Hora on 05/17/2017 11:02:12 [name] ([id number]) -------------------------------------------------------------------------------- Wound Assessment Details Patient Name: Bradley Loa A. Date of Service: 05/17/2017 11:00 AM Medical Record Number: [id number] Patient Account Number: [id number] Date of Birth/Sex: 05-10-1946 (70 y.o. Male) Treating RN: Montey Hora Primary Care Sundai Probert: [name] Other Clinician: Referring Terea Neubauer: [name] Treating Iraida Cragin/Extender: Ricard Dillon Weeks in Treatment: 18 Wound Status Wound Number: 8 Primary Pressure Ulcer Etiology: Wound Location: Midline Lumbar spine Wound Open Wounding Event: Pressure Injury Status: Date Acquired: 05/17/2017 Comorbid Anemia, Hypertension, History of pressure Weeks Of Treatment: 0 History: wounds, Rheumatoid Arthritis, Paraplegia Clustered Wound: No Photos Photo Uploaded By: Montey Hora on 05/17/2017 13:12:41 Wound Measurements Length: (cm) 1.9 Width: (cm)  2 Depth: (cm) 0.1 Area: (cm) 2.985 Volume: (cm) 0.298 % Reduction in Area: 5% % Reduction in Volume: 5.1% Epithelialization: None Tunneling: No Undermining: No Wound Description Classification: Unstageable/Unclassified Wound Margin: Flat and Intact Exudate Amount: None Present Foul Odor After Cleansing: No Slough/Fibrino No Wound Bed Granulation Amount: None Present (0%) Exposed Structure Necrotic Amount: Large (67-100%) Fascia Exposed: No Necrotic Quality: Eschar Fat Layer (Subcutaneous Tissue) Exposed: No Tendon Exposed: No Muscle Exposed: No Joint Exposed: No Bone Exposed: No Periwound Skin Texture Texture Color No Abnormalities Noted: No No Abnormalities Noted: No Bradley [name], Bradley A. ([id number]) Callus: No Atrophie Blanche: No Crepitus: No Cyanosis: No Excoriation: No Ecchymosis: No  Induration: No Erythema: No Rash: No Hemosiderin Staining: No Scarring: No Mottled: No Pallor: No Moisture Rubor: No No Abnormalities Noted: No Dry / Scaly: No Maceration: No Electronic Signature(s) Signed: 05/17/2017 5:01:45 PM By: Montey Hora Entered By: Montey Hora on 05/17/2017 11:20:58 [name] ([id number]) -------------------------------------------------------------------------------- Vitals Details Patient Name: Bradley Loa A. Date of Service: 05/17/2017 11:00 AM Medical Record Number: [id number] Patient Account Number: [id number] Date of Birth/Sex: 1946-02-14 (71 y.o. Male) Treating RN: Montey Hora Primary Care Skylinn Vialpando: [name] Other Clinician: Referring Dazani Norby: [name] Treating Misbah Hornaday/Extender: Tito Dine in Treatment: 18 Vital Signs Time Taken: 10:48 Temperature (F): 97.8 Height (in): 75 Pulse (bpm): 79 Weight (lbs): 209 Respiratory Rate (breaths/min): 16 Body Mass Index (BMI): 26.1 Blood Pressure (mmHg): 125/54 Reference Range: 80 - 120 mg / dl Electronic Signature(s) Signed: 05/17/2017  5:01:45 PM By: Montey Hora Entered By: Montey Hora on 05/17/2017 10:58:27

## 2017-05-24 DIAGNOSIS — N319 Neuromuscular dysfunction of bladder, unspecified: Secondary | ICD-10-CM | POA: Diagnosis not present

## 2017-05-24 DIAGNOSIS — Z933 Colostomy status: Secondary | ICD-10-CM | POA: Diagnosis not present

## 2017-05-24 DIAGNOSIS — G8220 Paraplegia, unspecified: Secondary | ICD-10-CM | POA: Diagnosis not present

## 2017-05-24 DIAGNOSIS — S24104S Unspecified injury at T11-T12 level of thoracic spinal cord, sequela: Secondary | ICD-10-CM | POA: Diagnosis not present

## 2017-05-24 DIAGNOSIS — D508 Other iron deficiency anemias: Secondary | ICD-10-CM | POA: Diagnosis not present

## 2017-05-24 DIAGNOSIS — K592 Neurogenic bowel, not elsewhere classified: Secondary | ICD-10-CM | POA: Diagnosis not present

## 2017-05-24 DIAGNOSIS — L89154 Pressure ulcer of sacral region, stage 4: Secondary | ICD-10-CM | POA: Diagnosis not present

## 2017-05-24 DIAGNOSIS — N400 Enlarged prostate without lower urinary tract symptoms: Secondary | ICD-10-CM | POA: Diagnosis not present

## 2017-05-24 DIAGNOSIS — I10 Essential (primary) hypertension: Secondary | ICD-10-CM | POA: Diagnosis not present

## 2017-05-26 ENCOUNTER — Other Ambulatory Visit: Payer: Self-pay

## 2017-05-26 DIAGNOSIS — D508 Other iron deficiency anemias: Secondary | ICD-10-CM | POA: Diagnosis not present

## 2017-05-26 DIAGNOSIS — R3989 Other symptoms and signs involving the genitourinary system: Secondary | ICD-10-CM | POA: Diagnosis not present

## 2017-05-26 DIAGNOSIS — S24104S Unspecified injury at T11-T12 level of thoracic spinal cord, sequela: Secondary | ICD-10-CM | POA: Diagnosis not present

## 2017-05-26 DIAGNOSIS — N400 Enlarged prostate without lower urinary tract symptoms: Secondary | ICD-10-CM | POA: Diagnosis not present

## 2017-05-26 DIAGNOSIS — Z933 Colostomy status: Secondary | ICD-10-CM | POA: Diagnosis not present

## 2017-05-26 DIAGNOSIS — I10 Essential (primary) hypertension: Secondary | ICD-10-CM | POA: Diagnosis not present

## 2017-05-26 DIAGNOSIS — L89154 Pressure ulcer of sacral region, stage 4: Secondary | ICD-10-CM | POA: Diagnosis not present

## 2017-05-26 DIAGNOSIS — R8290 Unspecified abnormal findings in urine: Secondary | ICD-10-CM | POA: Diagnosis not present

## 2017-05-26 DIAGNOSIS — G8220 Paraplegia, unspecified: Secondary | ICD-10-CM | POA: Diagnosis not present

## 2017-05-26 DIAGNOSIS — K592 Neurogenic bowel, not elsewhere classified: Secondary | ICD-10-CM | POA: Diagnosis not present

## 2017-05-26 DIAGNOSIS — N319 Neuromuscular dysfunction of bladder, unspecified: Secondary | ICD-10-CM | POA: Diagnosis not present

## 2017-05-31 ENCOUNTER — Ambulatory Visit: Payer: Medicare HMO | Admitting: Internal Medicine

## 2017-06-05 DIAGNOSIS — I10 Essential (primary) hypertension: Secondary | ICD-10-CM | POA: Diagnosis not present

## 2017-06-05 DIAGNOSIS — K592 Neurogenic bowel, not elsewhere classified: Secondary | ICD-10-CM | POA: Diagnosis not present

## 2017-06-05 DIAGNOSIS — N319 Neuromuscular dysfunction of bladder, unspecified: Secondary | ICD-10-CM | POA: Diagnosis not present

## 2017-06-05 DIAGNOSIS — S24104S Unspecified injury at T11-T12 level of thoracic spinal cord, sequela: Secondary | ICD-10-CM | POA: Diagnosis not present

## 2017-06-05 DIAGNOSIS — L89154 Pressure ulcer of sacral region, stage 4: Secondary | ICD-10-CM | POA: Diagnosis not present

## 2017-06-05 DIAGNOSIS — N400 Enlarged prostate without lower urinary tract symptoms: Secondary | ICD-10-CM | POA: Diagnosis not present

## 2017-06-05 DIAGNOSIS — D508 Other iron deficiency anemias: Secondary | ICD-10-CM | POA: Diagnosis not present

## 2017-06-05 DIAGNOSIS — Z933 Colostomy status: Secondary | ICD-10-CM | POA: Diagnosis not present

## 2017-06-05 DIAGNOSIS — G8220 Paraplegia, unspecified: Secondary | ICD-10-CM | POA: Diagnosis not present

## 2017-06-07 ENCOUNTER — Inpatient Hospital Stay: Payer: Medicare HMO

## 2017-06-07 DIAGNOSIS — D508 Other iron deficiency anemias: Secondary | ICD-10-CM | POA: Diagnosis not present

## 2017-06-07 DIAGNOSIS — L89154 Pressure ulcer of sacral region, stage 4: Secondary | ICD-10-CM | POA: Diagnosis not present

## 2017-06-07 DIAGNOSIS — N319 Neuromuscular dysfunction of bladder, unspecified: Secondary | ICD-10-CM | POA: Diagnosis not present

## 2017-06-07 DIAGNOSIS — G8220 Paraplegia, unspecified: Secondary | ICD-10-CM | POA: Diagnosis not present

## 2017-06-07 DIAGNOSIS — Z933 Colostomy status: Secondary | ICD-10-CM | POA: Diagnosis not present

## 2017-06-07 DIAGNOSIS — K592 Neurogenic bowel, not elsewhere classified: Secondary | ICD-10-CM | POA: Diagnosis not present

## 2017-06-07 DIAGNOSIS — N400 Enlarged prostate without lower urinary tract symptoms: Secondary | ICD-10-CM | POA: Diagnosis not present

## 2017-06-07 DIAGNOSIS — I10 Essential (primary) hypertension: Secondary | ICD-10-CM | POA: Diagnosis not present

## 2017-06-07 DIAGNOSIS — S24104S Unspecified injury at T11-T12 level of thoracic spinal cord, sequela: Secondary | ICD-10-CM | POA: Diagnosis not present

## 2017-06-09 DIAGNOSIS — L89154 Pressure ulcer of sacral region, stage 4: Secondary | ICD-10-CM | POA: Diagnosis not present

## 2017-06-09 DIAGNOSIS — M869 Osteomyelitis, unspecified: Secondary | ICD-10-CM | POA: Diagnosis not present

## 2017-06-12 DIAGNOSIS — L89159 Pressure ulcer of sacral region, unspecified stage: Secondary | ICD-10-CM | POA: Diagnosis not present

## 2017-06-12 DIAGNOSIS — I10 Essential (primary) hypertension: Secondary | ICD-10-CM | POA: Diagnosis not present

## 2017-06-12 DIAGNOSIS — M4628 Osteomyelitis of vertebra, sacral and sacrococcygeal region: Secondary | ICD-10-CM | POA: Diagnosis not present

## 2017-06-12 DIAGNOSIS — E785 Hyperlipidemia, unspecified: Secondary | ICD-10-CM | POA: Diagnosis not present

## 2017-06-12 DIAGNOSIS — R5381 Other malaise: Secondary | ICD-10-CM | POA: Diagnosis not present

## 2017-06-12 DIAGNOSIS — I878 Other specified disorders of veins: Secondary | ICD-10-CM | POA: Diagnosis not present

## 2017-06-12 DIAGNOSIS — M6281 Muscle weakness (generalized): Secondary | ICD-10-CM | POA: Diagnosis not present

## 2017-06-12 DIAGNOSIS — N319 Neuromuscular dysfunction of bladder, unspecified: Secondary | ICD-10-CM | POA: Diagnosis not present

## 2017-06-12 DIAGNOSIS — K592 Neurogenic bowel, not elsewhere classified: Secondary | ICD-10-CM | POA: Diagnosis not present

## 2017-06-12 DIAGNOSIS — D638 Anemia in other chronic diseases classified elsewhere: Secondary | ICD-10-CM | POA: Diagnosis not present

## 2017-06-12 DIAGNOSIS — N401 Enlarged prostate with lower urinary tract symptoms: Secondary | ICD-10-CM | POA: Diagnosis not present

## 2017-06-12 DIAGNOSIS — R9431 Abnormal electrocardiogram [ECG] [EKG]: Secondary | ICD-10-CM | POA: Diagnosis not present

## 2017-06-12 DIAGNOSIS — L89154 Pressure ulcer of sacral region, stage 4: Secondary | ICD-10-CM | POA: Diagnosis not present

## 2017-06-12 DIAGNOSIS — R279 Unspecified lack of coordination: Secondary | ICD-10-CM | POA: Diagnosis not present

## 2017-06-12 DIAGNOSIS — K435 Parastomal hernia without obstruction or  gangrene: Secondary | ICD-10-CM | POA: Diagnosis not present

## 2017-06-12 DIAGNOSIS — Z792 Long term (current) use of antibiotics: Secondary | ICD-10-CM | POA: Diagnosis not present

## 2017-06-12 DIAGNOSIS — M869 Osteomyelitis, unspecified: Secondary | ICD-10-CM | POA: Diagnosis not present

## 2017-06-12 DIAGNOSIS — Z452 Encounter for adjustment and management of vascular access device: Secondary | ICD-10-CM | POA: Diagnosis not present

## 2017-06-12 DIAGNOSIS — N400 Enlarged prostate without lower urinary tract symptoms: Secondary | ICD-10-CM | POA: Diagnosis not present

## 2017-06-12 DIAGNOSIS — Z933 Colostomy status: Secondary | ICD-10-CM | POA: Diagnosis not present

## 2017-06-12 DIAGNOSIS — G8220 Paraplegia, unspecified: Secondary | ICD-10-CM | POA: Diagnosis not present

## 2017-06-14 ENCOUNTER — Ambulatory Visit: Payer: Medicare HMO | Admitting: Oncology

## 2017-06-16 DIAGNOSIS — N401 Enlarged prostate with lower urinary tract symptoms: Secondary | ICD-10-CM | POA: Diagnosis not present

## 2017-06-16 DIAGNOSIS — Z933 Colostomy status: Secondary | ICD-10-CM | POA: Diagnosis not present

## 2017-06-16 DIAGNOSIS — G8220 Paraplegia, unspecified: Secondary | ICD-10-CM | POA: Diagnosis not present

## 2017-06-16 DIAGNOSIS — I10 Essential (primary) hypertension: Secondary | ICD-10-CM | POA: Diagnosis not present

## 2017-06-16 DIAGNOSIS — M869 Osteomyelitis, unspecified: Secondary | ICD-10-CM | POA: Diagnosis not present

## 2017-06-16 DIAGNOSIS — N319 Neuromuscular dysfunction of bladder, unspecified: Secondary | ICD-10-CM | POA: Diagnosis not present

## 2017-06-16 DIAGNOSIS — R279 Unspecified lack of coordination: Secondary | ICD-10-CM | POA: Diagnosis not present

## 2017-06-16 DIAGNOSIS — E785 Hyperlipidemia, unspecified: Secondary | ICD-10-CM | POA: Diagnosis not present

## 2017-06-16 DIAGNOSIS — D638 Anemia in other chronic diseases classified elsewhere: Secondary | ICD-10-CM | POA: Diagnosis not present

## 2017-06-16 DIAGNOSIS — R5381 Other malaise: Secondary | ICD-10-CM | POA: Diagnosis not present

## 2017-06-16 DIAGNOSIS — M6281 Muscle weakness (generalized): Secondary | ICD-10-CM | POA: Diagnosis not present

## 2017-06-16 DIAGNOSIS — L89154 Pressure ulcer of sacral region, stage 4: Secondary | ICD-10-CM | POA: Diagnosis not present

## 2017-06-16 DIAGNOSIS — M4628 Osteomyelitis of vertebra, sacral and sacrococcygeal region: Secondary | ICD-10-CM | POA: Diagnosis not present

## 2017-06-20 ENCOUNTER — Ambulatory Visit: Payer: Self-pay | Admitting: Urology

## 2017-07-27 DIAGNOSIS — G8220 Paraplegia, unspecified: Secondary | ICD-10-CM | POA: Diagnosis not present

## 2017-07-27 DIAGNOSIS — N319 Neuromuscular dysfunction of bladder, unspecified: Secondary | ICD-10-CM | POA: Diagnosis not present

## 2017-07-27 DIAGNOSIS — I10 Essential (primary) hypertension: Secondary | ICD-10-CM | POA: Diagnosis not present

## 2017-07-27 DIAGNOSIS — M4628 Osteomyelitis of vertebra, sacral and sacrococcygeal region: Secondary | ICD-10-CM | POA: Diagnosis not present

## 2017-07-30 DIAGNOSIS — L89154 Pressure ulcer of sacral region, stage 4: Secondary | ICD-10-CM | POA: Diagnosis not present

## 2017-07-30 DIAGNOSIS — G8220 Paraplegia, unspecified: Secondary | ICD-10-CM | POA: Diagnosis not present

## 2017-07-31 DIAGNOSIS — Z4800 Encounter for change or removal of nonsurgical wound dressing: Secondary | ICD-10-CM | POA: Diagnosis not present

## 2017-07-31 DIAGNOSIS — Z4801 Encounter for change or removal of surgical wound dressing: Secondary | ICD-10-CM | POA: Diagnosis not present

## 2017-07-31 DIAGNOSIS — M869 Osteomyelitis, unspecified: Secondary | ICD-10-CM | POA: Diagnosis not present

## 2017-08-01 DIAGNOSIS — I10 Essential (primary) hypertension: Secondary | ICD-10-CM | POA: Diagnosis not present

## 2017-08-01 DIAGNOSIS — Z4800 Encounter for change or removal of nonsurgical wound dressing: Secondary | ICD-10-CM | POA: Diagnosis not present

## 2017-08-01 DIAGNOSIS — N319 Neuromuscular dysfunction of bladder, unspecified: Secondary | ICD-10-CM | POA: Diagnosis not present

## 2017-08-01 DIAGNOSIS — M069 Rheumatoid arthritis, unspecified: Secondary | ICD-10-CM | POA: Diagnosis not present

## 2017-08-01 DIAGNOSIS — D649 Anemia, unspecified: Secondary | ICD-10-CM | POA: Diagnosis not present

## 2017-08-01 DIAGNOSIS — L89154 Pressure ulcer of sacral region, stage 4: Secondary | ICD-10-CM | POA: Diagnosis not present

## 2017-08-01 DIAGNOSIS — Z933 Colostomy status: Secondary | ICD-10-CM | POA: Diagnosis not present

## 2017-08-01 DIAGNOSIS — N401 Enlarged prostate with lower urinary tract symptoms: Secondary | ICD-10-CM | POA: Diagnosis not present

## 2017-08-01 DIAGNOSIS — M4638 Infection of intervertebral disc (pyogenic), sacral and sacrococcygeal region: Secondary | ICD-10-CM | POA: Diagnosis not present

## 2017-08-01 DIAGNOSIS — G8220 Paraplegia, unspecified: Secondary | ICD-10-CM | POA: Diagnosis not present

## 2017-08-02 DIAGNOSIS — Z452 Encounter for adjustment and management of vascular access device: Secondary | ICD-10-CM | POA: Diagnosis not present

## 2017-08-04 DIAGNOSIS — G8220 Paraplegia, unspecified: Secondary | ICD-10-CM | POA: Diagnosis not present

## 2017-08-04 DIAGNOSIS — D649 Anemia, unspecified: Secondary | ICD-10-CM | POA: Diagnosis not present

## 2017-08-04 DIAGNOSIS — I10 Essential (primary) hypertension: Secondary | ICD-10-CM | POA: Diagnosis not present

## 2017-08-04 DIAGNOSIS — N401 Enlarged prostate with lower urinary tract symptoms: Secondary | ICD-10-CM | POA: Diagnosis not present

## 2017-08-04 DIAGNOSIS — L89154 Pressure ulcer of sacral region, stage 4: Secondary | ICD-10-CM | POA: Diagnosis not present

## 2017-08-04 DIAGNOSIS — Z4800 Encounter for change or removal of nonsurgical wound dressing: Secondary | ICD-10-CM | POA: Diagnosis not present

## 2017-08-04 DIAGNOSIS — N319 Neuromuscular dysfunction of bladder, unspecified: Secondary | ICD-10-CM | POA: Diagnosis not present

## 2017-08-04 DIAGNOSIS — M4638 Infection of intervertebral disc (pyogenic), sacral and sacrococcygeal region: Secondary | ICD-10-CM | POA: Diagnosis not present

## 2017-08-04 DIAGNOSIS — M069 Rheumatoid arthritis, unspecified: Secondary | ICD-10-CM | POA: Diagnosis not present

## 2017-08-07 DIAGNOSIS — D649 Anemia, unspecified: Secondary | ICD-10-CM | POA: Diagnosis not present

## 2017-08-07 DIAGNOSIS — N401 Enlarged prostate with lower urinary tract symptoms: Secondary | ICD-10-CM | POA: Diagnosis not present

## 2017-08-07 DIAGNOSIS — G8220 Paraplegia, unspecified: Secondary | ICD-10-CM | POA: Diagnosis not present

## 2017-08-07 DIAGNOSIS — M069 Rheumatoid arthritis, unspecified: Secondary | ICD-10-CM | POA: Diagnosis not present

## 2017-08-07 DIAGNOSIS — I10 Essential (primary) hypertension: Secondary | ICD-10-CM | POA: Diagnosis not present

## 2017-08-07 DIAGNOSIS — L89154 Pressure ulcer of sacral region, stage 4: Secondary | ICD-10-CM | POA: Diagnosis not present

## 2017-08-07 DIAGNOSIS — M4638 Infection of intervertebral disc (pyogenic), sacral and sacrococcygeal region: Secondary | ICD-10-CM | POA: Diagnosis not present

## 2017-08-07 DIAGNOSIS — Z4800 Encounter for change or removal of nonsurgical wound dressing: Secondary | ICD-10-CM | POA: Diagnosis not present

## 2017-08-07 DIAGNOSIS — N319 Neuromuscular dysfunction of bladder, unspecified: Secondary | ICD-10-CM | POA: Diagnosis not present

## 2017-08-09 DIAGNOSIS — M069 Rheumatoid arthritis, unspecified: Secondary | ICD-10-CM | POA: Diagnosis not present

## 2017-08-09 DIAGNOSIS — N319 Neuromuscular dysfunction of bladder, unspecified: Secondary | ICD-10-CM | POA: Diagnosis not present

## 2017-08-09 DIAGNOSIS — N401 Enlarged prostate with lower urinary tract symptoms: Secondary | ICD-10-CM | POA: Diagnosis not present

## 2017-08-09 DIAGNOSIS — L89154 Pressure ulcer of sacral region, stage 4: Secondary | ICD-10-CM | POA: Diagnosis not present

## 2017-08-09 DIAGNOSIS — I10 Essential (primary) hypertension: Secondary | ICD-10-CM | POA: Diagnosis not present

## 2017-08-09 DIAGNOSIS — G8220 Paraplegia, unspecified: Secondary | ICD-10-CM | POA: Diagnosis not present

## 2017-08-09 DIAGNOSIS — Z4800 Encounter for change or removal of nonsurgical wound dressing: Secondary | ICD-10-CM | POA: Diagnosis not present

## 2017-08-09 DIAGNOSIS — M4638 Infection of intervertebral disc (pyogenic), sacral and sacrococcygeal region: Secondary | ICD-10-CM | POA: Diagnosis not present

## 2017-08-09 DIAGNOSIS — D649 Anemia, unspecified: Secondary | ICD-10-CM | POA: Diagnosis not present

## 2017-08-11 DIAGNOSIS — D649 Anemia, unspecified: Secondary | ICD-10-CM | POA: Diagnosis not present

## 2017-08-11 DIAGNOSIS — Z4800 Encounter for change or removal of nonsurgical wound dressing: Secondary | ICD-10-CM | POA: Diagnosis not present

## 2017-08-11 DIAGNOSIS — N319 Neuromuscular dysfunction of bladder, unspecified: Secondary | ICD-10-CM | POA: Diagnosis not present

## 2017-08-11 DIAGNOSIS — M069 Rheumatoid arthritis, unspecified: Secondary | ICD-10-CM | POA: Diagnosis not present

## 2017-08-11 DIAGNOSIS — L89154 Pressure ulcer of sacral region, stage 4: Secondary | ICD-10-CM | POA: Diagnosis not present

## 2017-08-11 DIAGNOSIS — N401 Enlarged prostate with lower urinary tract symptoms: Secondary | ICD-10-CM | POA: Diagnosis not present

## 2017-08-11 DIAGNOSIS — M4638 Infection of intervertebral disc (pyogenic), sacral and sacrococcygeal region: Secondary | ICD-10-CM | POA: Diagnosis not present

## 2017-08-11 DIAGNOSIS — G8220 Paraplegia, unspecified: Secondary | ICD-10-CM | POA: Diagnosis not present

## 2017-08-11 DIAGNOSIS — I10 Essential (primary) hypertension: Secondary | ICD-10-CM | POA: Diagnosis not present

## 2017-08-14 DIAGNOSIS — Z4800 Encounter for change or removal of nonsurgical wound dressing: Secondary | ICD-10-CM | POA: Diagnosis not present

## 2017-08-14 DIAGNOSIS — I10 Essential (primary) hypertension: Secondary | ICD-10-CM | POA: Diagnosis not present

## 2017-08-14 DIAGNOSIS — M069 Rheumatoid arthritis, unspecified: Secondary | ICD-10-CM | POA: Diagnosis not present

## 2017-08-14 DIAGNOSIS — N401 Enlarged prostate with lower urinary tract symptoms: Secondary | ICD-10-CM | POA: Diagnosis not present

## 2017-08-14 DIAGNOSIS — L89154 Pressure ulcer of sacral region, stage 4: Secondary | ICD-10-CM | POA: Diagnosis not present

## 2017-08-14 DIAGNOSIS — G8220 Paraplegia, unspecified: Secondary | ICD-10-CM | POA: Diagnosis not present

## 2017-08-14 DIAGNOSIS — M4638 Infection of intervertebral disc (pyogenic), sacral and sacrococcygeal region: Secondary | ICD-10-CM | POA: Diagnosis not present

## 2017-08-14 DIAGNOSIS — D649 Anemia, unspecified: Secondary | ICD-10-CM | POA: Diagnosis not present

## 2017-08-14 DIAGNOSIS — N319 Neuromuscular dysfunction of bladder, unspecified: Secondary | ICD-10-CM | POA: Diagnosis not present

## 2017-08-16 DIAGNOSIS — M4638 Infection of intervertebral disc (pyogenic), sacral and sacrococcygeal region: Secondary | ICD-10-CM | POA: Diagnosis not present

## 2017-08-16 DIAGNOSIS — I10 Essential (primary) hypertension: Secondary | ICD-10-CM | POA: Diagnosis not present

## 2017-08-16 DIAGNOSIS — N319 Neuromuscular dysfunction of bladder, unspecified: Secondary | ICD-10-CM | POA: Diagnosis not present

## 2017-08-16 DIAGNOSIS — N401 Enlarged prostate with lower urinary tract symptoms: Secondary | ICD-10-CM | POA: Diagnosis not present

## 2017-08-16 DIAGNOSIS — G8220 Paraplegia, unspecified: Secondary | ICD-10-CM | POA: Diagnosis not present

## 2017-08-16 DIAGNOSIS — D649 Anemia, unspecified: Secondary | ICD-10-CM | POA: Diagnosis not present

## 2017-08-16 DIAGNOSIS — Z4800 Encounter for change or removal of nonsurgical wound dressing: Secondary | ICD-10-CM | POA: Diagnosis not present

## 2017-08-16 DIAGNOSIS — L89154 Pressure ulcer of sacral region, stage 4: Secondary | ICD-10-CM | POA: Diagnosis not present

## 2017-08-16 DIAGNOSIS — M069 Rheumatoid arthritis, unspecified: Secondary | ICD-10-CM | POA: Diagnosis not present

## 2017-08-18 DIAGNOSIS — N401 Enlarged prostate with lower urinary tract symptoms: Secondary | ICD-10-CM | POA: Diagnosis not present

## 2017-08-18 DIAGNOSIS — M4638 Infection of intervertebral disc (pyogenic), sacral and sacrococcygeal region: Secondary | ICD-10-CM | POA: Diagnosis not present

## 2017-08-18 DIAGNOSIS — I10 Essential (primary) hypertension: Secondary | ICD-10-CM | POA: Diagnosis not present

## 2017-08-18 DIAGNOSIS — G8220 Paraplegia, unspecified: Secondary | ICD-10-CM | POA: Diagnosis not present

## 2017-08-18 DIAGNOSIS — Z4800 Encounter for change or removal of nonsurgical wound dressing: Secondary | ICD-10-CM | POA: Diagnosis not present

## 2017-08-18 DIAGNOSIS — M069 Rheumatoid arthritis, unspecified: Secondary | ICD-10-CM | POA: Diagnosis not present

## 2017-08-18 DIAGNOSIS — L89154 Pressure ulcer of sacral region, stage 4: Secondary | ICD-10-CM | POA: Diagnosis not present

## 2017-08-18 DIAGNOSIS — D649 Anemia, unspecified: Secondary | ICD-10-CM | POA: Diagnosis not present

## 2017-08-18 DIAGNOSIS — N319 Neuromuscular dysfunction of bladder, unspecified: Secondary | ICD-10-CM | POA: Diagnosis not present

## 2017-08-21 DIAGNOSIS — L89154 Pressure ulcer of sacral region, stage 4: Secondary | ICD-10-CM | POA: Diagnosis not present

## 2017-08-21 DIAGNOSIS — M069 Rheumatoid arthritis, unspecified: Secondary | ICD-10-CM | POA: Diagnosis not present

## 2017-08-21 DIAGNOSIS — I10 Essential (primary) hypertension: Secondary | ICD-10-CM | POA: Diagnosis not present

## 2017-08-21 DIAGNOSIS — N319 Neuromuscular dysfunction of bladder, unspecified: Secondary | ICD-10-CM | POA: Diagnosis not present

## 2017-08-21 DIAGNOSIS — G8220 Paraplegia, unspecified: Secondary | ICD-10-CM | POA: Diagnosis not present

## 2017-08-21 DIAGNOSIS — D649 Anemia, unspecified: Secondary | ICD-10-CM | POA: Diagnosis not present

## 2017-08-21 DIAGNOSIS — N401 Enlarged prostate with lower urinary tract symptoms: Secondary | ICD-10-CM | POA: Diagnosis not present

## 2017-08-21 DIAGNOSIS — Z4800 Encounter for change or removal of nonsurgical wound dressing: Secondary | ICD-10-CM | POA: Diagnosis not present

## 2017-08-21 DIAGNOSIS — M4638 Infection of intervertebral disc (pyogenic), sacral and sacrococcygeal region: Secondary | ICD-10-CM | POA: Diagnosis not present

## 2017-08-23 DIAGNOSIS — G8220 Paraplegia, unspecified: Secondary | ICD-10-CM | POA: Diagnosis not present

## 2017-08-23 DIAGNOSIS — D649 Anemia, unspecified: Secondary | ICD-10-CM | POA: Diagnosis not present

## 2017-08-23 DIAGNOSIS — M069 Rheumatoid arthritis, unspecified: Secondary | ICD-10-CM | POA: Diagnosis not present

## 2017-08-23 DIAGNOSIS — N401 Enlarged prostate with lower urinary tract symptoms: Secondary | ICD-10-CM | POA: Diagnosis not present

## 2017-08-23 DIAGNOSIS — N319 Neuromuscular dysfunction of bladder, unspecified: Secondary | ICD-10-CM | POA: Diagnosis not present

## 2017-08-23 DIAGNOSIS — I10 Essential (primary) hypertension: Secondary | ICD-10-CM | POA: Diagnosis not present

## 2017-08-23 DIAGNOSIS — M4638 Infection of intervertebral disc (pyogenic), sacral and sacrococcygeal region: Secondary | ICD-10-CM | POA: Diagnosis not present

## 2017-08-23 DIAGNOSIS — L89154 Pressure ulcer of sacral region, stage 4: Secondary | ICD-10-CM | POA: Diagnosis not present

## 2017-08-23 DIAGNOSIS — Z4800 Encounter for change or removal of nonsurgical wound dressing: Secondary | ICD-10-CM | POA: Diagnosis not present

## 2017-08-25 ENCOUNTER — Ambulatory Visit (INDEPENDENT_AMBULATORY_CARE_PROVIDER_SITE_OTHER): Payer: Medicare HMO | Admitting: Urology

## 2017-08-25 ENCOUNTER — Encounter: Payer: Self-pay | Admitting: Urology

## 2017-08-25 VITALS — BP 116/69 | HR 80 | Ht 74.0 in | Wt 209.0 lb

## 2017-08-25 DIAGNOSIS — N390 Urinary tract infection, site not specified: Secondary | ICD-10-CM

## 2017-08-25 DIAGNOSIS — M069 Rheumatoid arthritis, unspecified: Secondary | ICD-10-CM | POA: Diagnosis not present

## 2017-08-25 DIAGNOSIS — I10 Essential (primary) hypertension: Secondary | ICD-10-CM | POA: Diagnosis not present

## 2017-08-25 DIAGNOSIS — D649 Anemia, unspecified: Secondary | ICD-10-CM | POA: Diagnosis not present

## 2017-08-25 DIAGNOSIS — N319 Neuromuscular dysfunction of bladder, unspecified: Secondary | ICD-10-CM | POA: Diagnosis not present

## 2017-08-25 DIAGNOSIS — N401 Enlarged prostate with lower urinary tract symptoms: Secondary | ICD-10-CM | POA: Diagnosis not present

## 2017-08-25 DIAGNOSIS — M4638 Infection of intervertebral disc (pyogenic), sacral and sacrococcygeal region: Secondary | ICD-10-CM | POA: Diagnosis not present

## 2017-08-25 DIAGNOSIS — Z4800 Encounter for change or removal of nonsurgical wound dressing: Secondary | ICD-10-CM | POA: Diagnosis not present

## 2017-08-25 DIAGNOSIS — G8220 Paraplegia, unspecified: Secondary | ICD-10-CM | POA: Diagnosis not present

## 2017-08-25 DIAGNOSIS — L89154 Pressure ulcer of sacral region, stage 4: Secondary | ICD-10-CM | POA: Diagnosis not present

## 2017-08-25 LAB — URINALYSIS, COMPLETE
Bilirubin, UA: NEGATIVE
GLUCOSE, UA: NEGATIVE
KETONES UA: NEGATIVE
Nitrite, UA: NEGATIVE
PH UA: 7 (ref 5.0–7.5)
Protein, UA: NEGATIVE
SPEC GRAV UA: 1.015 (ref 1.005–1.030)
UUROB: 4 mg/dL — AB (ref 0.2–1.0)

## 2017-08-25 LAB — MICROSCOPIC EXAMINATION
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
## 2017-08-27 ENCOUNTER — Encounter: Payer: Self-pay | Admitting: Urology

## 2017-08-28 ENCOUNTER — Telehealth: Payer: Self-pay

## 2017-08-28 DIAGNOSIS — G8220 Paraplegia, unspecified: Secondary | ICD-10-CM | POA: Diagnosis not present

## 2017-08-28 DIAGNOSIS — D649 Anemia, unspecified: Secondary | ICD-10-CM | POA: Diagnosis not present

## 2017-08-28 DIAGNOSIS — N401 Enlarged prostate with lower urinary tract symptoms: Secondary | ICD-10-CM | POA: Diagnosis not present

## 2017-08-28 DIAGNOSIS — L89154 Pressure ulcer of sacral region, stage 4: Secondary | ICD-10-CM | POA: Diagnosis not present

## 2017-08-28 DIAGNOSIS — M4638 Infection of intervertebral disc (pyogenic), sacral and sacrococcygeal region: Secondary | ICD-10-CM | POA: Diagnosis not present

## 2017-08-28 DIAGNOSIS — Z4800 Encounter for change or removal of nonsurgical wound dressing: Secondary | ICD-10-CM | POA: Diagnosis not present

## 2017-08-28 DIAGNOSIS — N319 Neuromuscular dysfunction of bladder, unspecified: Secondary | ICD-10-CM | POA: Diagnosis not present

## 2017-08-28 DIAGNOSIS — I10 Essential (primary) hypertension: Secondary | ICD-10-CM | POA: Diagnosis not present

## 2017-08-28 DIAGNOSIS — M069 Rheumatoid arthritis, unspecified: Secondary | ICD-10-CM | POA: Diagnosis not present

## 2017-08-28 LAB — CULTURE, URINE COMPREHENSIVE

## 2017-08-28 NOTE — Telephone Encounter (Signed)
Letter sent.

## 2017-08-28 NOTE — Telephone Encounter (Signed)
-----   Message from Abbie Sons, MD sent at 08/28/2017  7:40 AM EDT ----- Urine culture had no significant bacterial growth

## 2017-08-30 DIAGNOSIS — N401 Enlarged prostate with lower urinary tract symptoms: Secondary | ICD-10-CM | POA: Diagnosis not present

## 2017-08-30 DIAGNOSIS — I10 Essential (primary) hypertension: Secondary | ICD-10-CM | POA: Diagnosis not present

## 2017-08-30 DIAGNOSIS — Z4800 Encounter for change or removal of nonsurgical wound dressing: Secondary | ICD-10-CM | POA: Diagnosis not present

## 2017-08-30 DIAGNOSIS — M19041 Primary osteoarthritis, right hand: Secondary | ICD-10-CM | POA: Diagnosis not present

## 2017-08-30 DIAGNOSIS — M069 Rheumatoid arthritis, unspecified: Secondary | ICD-10-CM | POA: Diagnosis not present

## 2017-08-30 DIAGNOSIS — L89154 Pressure ulcer of sacral region, stage 4: Secondary | ICD-10-CM | POA: Diagnosis not present

## 2017-08-30 DIAGNOSIS — M62462 Contracture of muscle, left lower leg: Secondary | ICD-10-CM | POA: Diagnosis not present

## 2017-08-30 DIAGNOSIS — G8220 Paraplegia, unspecified: Secondary | ICD-10-CM | POA: Diagnosis not present

## 2017-08-30 DIAGNOSIS — N319 Neuromuscular dysfunction of bladder, unspecified: Secondary | ICD-10-CM | POA: Diagnosis not present

## 2017-08-30 DIAGNOSIS — D649 Anemia, unspecified: Secondary | ICD-10-CM | POA: Diagnosis not present

## 2017-08-30 DIAGNOSIS — M19042 Primary osteoarthritis, left hand: Secondary | ICD-10-CM | POA: Diagnosis not present

## 2017-08-30 DIAGNOSIS — M79642 Pain in left hand: Secondary | ICD-10-CM | POA: Diagnosis not present

## 2017-08-30 DIAGNOSIS — M4638 Infection of intervertebral disc (pyogenic), sacral and sacrococcygeal region: Secondary | ICD-10-CM | POA: Diagnosis not present

## 2017-08-30 DIAGNOSIS — M62461 Contracture of muscle, right lower leg: Secondary | ICD-10-CM | POA: Diagnosis not present

## 2017-08-30 DIAGNOSIS — D509 Iron deficiency anemia, unspecified: Secondary | ICD-10-CM | POA: Diagnosis not present

## 2017-08-30 DIAGNOSIS — M79641 Pain in right hand: Secondary | ICD-10-CM | POA: Diagnosis not present

## 2017-09-01 DIAGNOSIS — I10 Essential (primary) hypertension: Secondary | ICD-10-CM | POA: Diagnosis not present

## 2017-09-01 DIAGNOSIS — Z4800 Encounter for change or removal of nonsurgical wound dressing: Secondary | ICD-10-CM | POA: Diagnosis not present

## 2017-09-01 DIAGNOSIS — N319 Neuromuscular dysfunction of bladder, unspecified: Secondary | ICD-10-CM | POA: Diagnosis not present

## 2017-09-01 DIAGNOSIS — L89154 Pressure ulcer of sacral region, stage 4: Secondary | ICD-10-CM | POA: Diagnosis not present

## 2017-09-01 DIAGNOSIS — N401 Enlarged prostate with lower urinary tract symptoms: Secondary | ICD-10-CM | POA: Diagnosis not present

## 2017-09-01 DIAGNOSIS — M069 Rheumatoid arthritis, unspecified: Secondary | ICD-10-CM | POA: Diagnosis not present

## 2017-09-01 DIAGNOSIS — G8220 Paraplegia, unspecified: Secondary | ICD-10-CM | POA: Diagnosis not present

## 2017-09-01 DIAGNOSIS — M4638 Infection of intervertebral disc (pyogenic), sacral and sacrococcygeal region: Secondary | ICD-10-CM | POA: Diagnosis not present

## 2017-09-01 DIAGNOSIS — D649 Anemia, unspecified: Secondary | ICD-10-CM | POA: Diagnosis not present

## 2017-09-02 DIAGNOSIS — I10 Essential (primary) hypertension: Secondary | ICD-10-CM | POA: Diagnosis not present

## 2017-09-02 DIAGNOSIS — M069 Rheumatoid arthritis, unspecified: Secondary | ICD-10-CM | POA: Diagnosis not present

## 2017-09-02 DIAGNOSIS — D649 Anemia, unspecified: Secondary | ICD-10-CM | POA: Diagnosis not present

## 2017-09-02 DIAGNOSIS — N401 Enlarged prostate with lower urinary tract symptoms: Secondary | ICD-10-CM | POA: Diagnosis not present

## 2017-09-02 DIAGNOSIS — G8220 Paraplegia, unspecified: Secondary | ICD-10-CM | POA: Diagnosis not present

## 2017-09-02 DIAGNOSIS — N319 Neuromuscular dysfunction of bladder, unspecified: Secondary | ICD-10-CM | POA: Diagnosis not present

## 2017-09-02 DIAGNOSIS — Z4800 Encounter for change or removal of nonsurgical wound dressing: Secondary | ICD-10-CM | POA: Diagnosis not present

## 2017-09-02 DIAGNOSIS — L89154 Pressure ulcer of sacral region, stage 4: Secondary | ICD-10-CM | POA: Diagnosis not present

## 2017-09-02 DIAGNOSIS — M4638 Infection of intervertebral disc (pyogenic), sacral and sacrococcygeal region: Secondary | ICD-10-CM | POA: Diagnosis not present

## 2017-09-04 DIAGNOSIS — G8220 Paraplegia, unspecified: Secondary | ICD-10-CM | POA: Diagnosis not present

## 2017-09-04 DIAGNOSIS — N319 Neuromuscular dysfunction of bladder, unspecified: Secondary | ICD-10-CM | POA: Diagnosis not present

## 2017-09-04 DIAGNOSIS — D649 Anemia, unspecified: Secondary | ICD-10-CM | POA: Diagnosis not present

## 2017-09-04 DIAGNOSIS — M069 Rheumatoid arthritis, unspecified: Secondary | ICD-10-CM | POA: Diagnosis not present

## 2017-09-04 DIAGNOSIS — M869 Osteomyelitis, unspecified: Secondary | ICD-10-CM | POA: Diagnosis not present

## 2017-09-04 DIAGNOSIS — Z4800 Encounter for change or removal of nonsurgical wound dressing: Secondary | ICD-10-CM | POA: Diagnosis not present

## 2017-09-04 DIAGNOSIS — K435 Parastomal hernia without obstruction or  gangrene: Secondary | ICD-10-CM | POA: Diagnosis not present

## 2017-09-04 DIAGNOSIS — N401 Enlarged prostate with lower urinary tract symptoms: Secondary | ICD-10-CM | POA: Diagnosis not present

## 2017-09-04 DIAGNOSIS — M4638 Infection of intervertebral disc (pyogenic), sacral and sacrococcygeal region: Secondary | ICD-10-CM | POA: Diagnosis not present

## 2017-09-04 DIAGNOSIS — I10 Essential (primary) hypertension: Secondary | ICD-10-CM | POA: Diagnosis not present

## 2017-09-04 DIAGNOSIS — L89154 Pressure ulcer of sacral region, stage 4: Secondary | ICD-10-CM | POA: Diagnosis not present

## 2017-09-06 DIAGNOSIS — Z4800 Encounter for change or removal of nonsurgical wound dressing: Secondary | ICD-10-CM | POA: Diagnosis not present

## 2017-09-06 DIAGNOSIS — M4638 Infection of intervertebral disc (pyogenic), sacral and sacrococcygeal region: Secondary | ICD-10-CM | POA: Diagnosis not present

## 2017-09-06 DIAGNOSIS — D649 Anemia, unspecified: Secondary | ICD-10-CM | POA: Diagnosis not present

## 2017-09-06 DIAGNOSIS — L89154 Pressure ulcer of sacral region, stage 4: Secondary | ICD-10-CM | POA: Diagnosis not present

## 2017-09-06 DIAGNOSIS — N401 Enlarged prostate with lower urinary tract symptoms: Secondary | ICD-10-CM | POA: Diagnosis not present

## 2017-09-06 DIAGNOSIS — N319 Neuromuscular dysfunction of bladder, unspecified: Secondary | ICD-10-CM | POA: Diagnosis not present

## 2017-09-06 DIAGNOSIS — G8220 Paraplegia, unspecified: Secondary | ICD-10-CM | POA: Diagnosis not present

## 2017-09-06 DIAGNOSIS — I10 Essential (primary) hypertension: Secondary | ICD-10-CM | POA: Diagnosis not present

## 2017-09-06 DIAGNOSIS — M069 Rheumatoid arthritis, unspecified: Secondary | ICD-10-CM | POA: Diagnosis not present

## 2017-09-08 DIAGNOSIS — N319 Neuromuscular dysfunction of bladder, unspecified: Secondary | ICD-10-CM | POA: Diagnosis not present

## 2017-09-08 DIAGNOSIS — I10 Essential (primary) hypertension: Secondary | ICD-10-CM | POA: Diagnosis not present

## 2017-09-08 DIAGNOSIS — L89154 Pressure ulcer of sacral region, stage 4: Secondary | ICD-10-CM | POA: Diagnosis not present

## 2017-09-08 DIAGNOSIS — N401 Enlarged prostate with lower urinary tract symptoms: Secondary | ICD-10-CM | POA: Diagnosis not present

## 2017-09-08 DIAGNOSIS — M069 Rheumatoid arthritis, unspecified: Secondary | ICD-10-CM | POA: Diagnosis not present

## 2017-09-08 DIAGNOSIS — G8220 Paraplegia, unspecified: Secondary | ICD-10-CM | POA: Diagnosis not present

## 2017-09-08 DIAGNOSIS — M4638 Infection of intervertebral disc (pyogenic), sacral and sacrococcygeal region: Secondary | ICD-10-CM | POA: Diagnosis not present

## 2017-09-08 DIAGNOSIS — D649 Anemia, unspecified: Secondary | ICD-10-CM | POA: Diagnosis not present

## 2017-09-08 DIAGNOSIS — Z4800 Encounter for change or removal of nonsurgical wound dressing: Secondary | ICD-10-CM | POA: Diagnosis not present

## 2017-09-11 DIAGNOSIS — L89154 Pressure ulcer of sacral region, stage 4: Secondary | ICD-10-CM | POA: Diagnosis not present

## 2017-09-11 DIAGNOSIS — G8220 Paraplegia, unspecified: Secondary | ICD-10-CM | POA: Diagnosis not present

## 2017-09-11 DIAGNOSIS — M069 Rheumatoid arthritis, unspecified: Secondary | ICD-10-CM | POA: Diagnosis not present

## 2017-09-11 DIAGNOSIS — N401 Enlarged prostate with lower urinary tract symptoms: Secondary | ICD-10-CM | POA: Diagnosis not present

## 2017-09-11 DIAGNOSIS — M4638 Infection of intervertebral disc (pyogenic), sacral and sacrococcygeal region: Secondary | ICD-10-CM | POA: Diagnosis not present

## 2017-09-11 DIAGNOSIS — Z4800 Encounter for change or removal of nonsurgical wound dressing: Secondary | ICD-10-CM | POA: Diagnosis not present

## 2017-09-11 DIAGNOSIS — I10 Essential (primary) hypertension: Secondary | ICD-10-CM | POA: Diagnosis not present

## 2017-09-11 DIAGNOSIS — N319 Neuromuscular dysfunction of bladder, unspecified: Secondary | ICD-10-CM | POA: Diagnosis not present

## 2017-09-11 DIAGNOSIS — D649 Anemia, unspecified: Secondary | ICD-10-CM | POA: Diagnosis not present

## 2017-09-13 DIAGNOSIS — M4638 Infection of intervertebral disc (pyogenic), sacral and sacrococcygeal region: Secondary | ICD-10-CM | POA: Diagnosis not present

## 2017-09-13 DIAGNOSIS — L89154 Pressure ulcer of sacral region, stage 4: Secondary | ICD-10-CM | POA: Diagnosis not present

## 2017-09-13 DIAGNOSIS — N401 Enlarged prostate with lower urinary tract symptoms: Secondary | ICD-10-CM | POA: Diagnosis not present

## 2017-09-13 DIAGNOSIS — M069 Rheumatoid arthritis, unspecified: Secondary | ICD-10-CM | POA: Diagnosis not present

## 2017-09-13 DIAGNOSIS — D649 Anemia, unspecified: Secondary | ICD-10-CM | POA: Diagnosis not present

## 2017-09-13 DIAGNOSIS — G8220 Paraplegia, unspecified: Secondary | ICD-10-CM | POA: Diagnosis not present

## 2017-09-13 DIAGNOSIS — N319 Neuromuscular dysfunction of bladder, unspecified: Secondary | ICD-10-CM | POA: Diagnosis not present

## 2017-09-13 DIAGNOSIS — I10 Essential (primary) hypertension: Secondary | ICD-10-CM | POA: Diagnosis not present

## 2017-09-13 DIAGNOSIS — Z4800 Encounter for change or removal of nonsurgical wound dressing: Secondary | ICD-10-CM | POA: Diagnosis not present

## 2017-09-14 DIAGNOSIS — Z4800 Encounter for change or removal of nonsurgical wound dressing: Secondary | ICD-10-CM | POA: Diagnosis not present

## 2017-09-14 DIAGNOSIS — D649 Anemia, unspecified: Secondary | ICD-10-CM | POA: Diagnosis not present

## 2017-09-14 DIAGNOSIS — I10 Essential (primary) hypertension: Secondary | ICD-10-CM | POA: Diagnosis not present

## 2017-09-14 DIAGNOSIS — M069 Rheumatoid arthritis, unspecified: Secondary | ICD-10-CM | POA: Diagnosis not present

## 2017-09-14 DIAGNOSIS — N401 Enlarged prostate with lower urinary tract symptoms: Secondary | ICD-10-CM | POA: Diagnosis not present

## 2017-09-14 DIAGNOSIS — G8220 Paraplegia, unspecified: Secondary | ICD-10-CM | POA: Diagnosis not present

## 2017-09-14 DIAGNOSIS — M4638 Infection of intervertebral disc (pyogenic), sacral and sacrococcygeal region: Secondary | ICD-10-CM | POA: Diagnosis not present

## 2017-09-14 DIAGNOSIS — N319 Neuromuscular dysfunction of bladder, unspecified: Secondary | ICD-10-CM | POA: Diagnosis not present

## 2017-09-14 DIAGNOSIS — L89154 Pressure ulcer of sacral region, stage 4: Secondary | ICD-10-CM | POA: Diagnosis not present

## 2017-09-15 DIAGNOSIS — N319 Neuromuscular dysfunction of bladder, unspecified: Secondary | ICD-10-CM | POA: Diagnosis not present

## 2017-09-15 DIAGNOSIS — M069 Rheumatoid arthritis, unspecified: Secondary | ICD-10-CM | POA: Diagnosis not present

## 2017-09-15 DIAGNOSIS — D649 Anemia, unspecified: Secondary | ICD-10-CM | POA: Diagnosis not present

## 2017-09-15 DIAGNOSIS — L89154 Pressure ulcer of sacral region, stage 4: Secondary | ICD-10-CM | POA: Diagnosis not present

## 2017-09-15 DIAGNOSIS — Z4800 Encounter for change or removal of nonsurgical wound dressing: Secondary | ICD-10-CM | POA: Diagnosis not present

## 2017-09-15 DIAGNOSIS — G8220 Paraplegia, unspecified: Secondary | ICD-10-CM | POA: Diagnosis not present

## 2017-09-15 DIAGNOSIS — M4638 Infection of intervertebral disc (pyogenic), sacral and sacrococcygeal region: Secondary | ICD-10-CM | POA: Diagnosis not present

## 2017-09-15 DIAGNOSIS — N401 Enlarged prostate with lower urinary tract symptoms: Secondary | ICD-10-CM | POA: Diagnosis not present

## 2017-09-15 DIAGNOSIS — I10 Essential (primary) hypertension: Secondary | ICD-10-CM | POA: Diagnosis not present

## 2017-09-18 DIAGNOSIS — N319 Neuromuscular dysfunction of bladder, unspecified: Secondary | ICD-10-CM | POA: Diagnosis not present

## 2017-09-18 DIAGNOSIS — I10 Essential (primary) hypertension: Secondary | ICD-10-CM | POA: Diagnosis not present

## 2017-09-18 DIAGNOSIS — Z4800 Encounter for change or removal of nonsurgical wound dressing: Secondary | ICD-10-CM | POA: Diagnosis not present

## 2017-09-18 DIAGNOSIS — D649 Anemia, unspecified: Secondary | ICD-10-CM | POA: Diagnosis not present

## 2017-09-18 DIAGNOSIS — N401 Enlarged prostate with lower urinary tract symptoms: Secondary | ICD-10-CM | POA: Diagnosis not present

## 2017-09-18 DIAGNOSIS — M069 Rheumatoid arthritis, unspecified: Secondary | ICD-10-CM | POA: Diagnosis not present

## 2017-09-18 DIAGNOSIS — L89154 Pressure ulcer of sacral region, stage 4: Secondary | ICD-10-CM | POA: Diagnosis not present

## 2017-09-18 DIAGNOSIS — G8220 Paraplegia, unspecified: Secondary | ICD-10-CM | POA: Diagnosis not present

## 2017-09-18 DIAGNOSIS — M4638 Infection of intervertebral disc (pyogenic), sacral and sacrococcygeal region: Secondary | ICD-10-CM | POA: Diagnosis not present

## 2017-09-19 DIAGNOSIS — G8220 Paraplegia, unspecified: Secondary | ICD-10-CM | POA: Diagnosis not present

## 2017-09-19 DIAGNOSIS — L89154 Pressure ulcer of sacral region, stage 4: Secondary | ICD-10-CM | POA: Diagnosis not present

## 2017-09-20 DIAGNOSIS — Z4800 Encounter for change or removal of nonsurgical wound dressing: Secondary | ICD-10-CM | POA: Diagnosis not present

## 2017-09-20 DIAGNOSIS — L89154 Pressure ulcer of sacral region, stage 4: Secondary | ICD-10-CM | POA: Diagnosis not present

## 2017-09-20 DIAGNOSIS — G8220 Paraplegia, unspecified: Secondary | ICD-10-CM | POA: Diagnosis not present

## 2017-09-20 DIAGNOSIS — I10 Essential (primary) hypertension: Secondary | ICD-10-CM | POA: Diagnosis not present

## 2017-09-20 DIAGNOSIS — M069 Rheumatoid arthritis, unspecified: Secondary | ICD-10-CM | POA: Diagnosis not present

## 2017-09-20 DIAGNOSIS — N401 Enlarged prostate with lower urinary tract symptoms: Secondary | ICD-10-CM | POA: Diagnosis not present

## 2017-09-20 DIAGNOSIS — D649 Anemia, unspecified: Secondary | ICD-10-CM | POA: Diagnosis not present

## 2017-09-20 DIAGNOSIS — M4638 Infection of intervertebral disc (pyogenic), sacral and sacrococcygeal region: Secondary | ICD-10-CM | POA: Diagnosis not present

## 2017-09-20 DIAGNOSIS — N319 Neuromuscular dysfunction of bladder, unspecified: Secondary | ICD-10-CM | POA: Diagnosis not present

## 2017-09-21 ENCOUNTER — Encounter: Payer: Medicare HMO | Attending: Nurse Practitioner | Admitting: Nurse Practitioner

## 2017-09-21 DIAGNOSIS — Z87891 Personal history of nicotine dependence: Secondary | ICD-10-CM | POA: Insufficient documentation

## 2017-09-21 DIAGNOSIS — I10 Essential (primary) hypertension: Secondary | ICD-10-CM | POA: Insufficient documentation

## 2017-09-21 DIAGNOSIS — L89154 Pressure ulcer of sacral region, stage 4: Secondary | ICD-10-CM | POA: Insufficient documentation

## 2017-09-21 DIAGNOSIS — M8668 Other chronic osteomyelitis, other site: Secondary | ICD-10-CM | POA: Insufficient documentation

## 2017-09-21 DIAGNOSIS — M069 Rheumatoid arthritis, unspecified: Secondary | ICD-10-CM | POA: Insufficient documentation

## 2017-09-21 DIAGNOSIS — Z933 Colostomy status: Secondary | ICD-10-CM | POA: Diagnosis not present

## 2017-09-21 DIAGNOSIS — G8220 Paraplegia, unspecified: Secondary | ICD-10-CM | POA: Diagnosis not present

## 2017-09-22 DIAGNOSIS — M4638 Infection of intervertebral disc (pyogenic), sacral and sacrococcygeal region: Secondary | ICD-10-CM | POA: Diagnosis not present

## 2017-09-22 DIAGNOSIS — L89154 Pressure ulcer of sacral region, stage 4: Secondary | ICD-10-CM | POA: Diagnosis not present

## 2017-09-22 DIAGNOSIS — G8220 Paraplegia, unspecified: Secondary | ICD-10-CM | POA: Diagnosis not present

## 2017-09-22 DIAGNOSIS — N319 Neuromuscular dysfunction of bladder, unspecified: Secondary | ICD-10-CM | POA: Diagnosis not present

## 2017-09-22 DIAGNOSIS — N401 Enlarged prostate with lower urinary tract symptoms: Secondary | ICD-10-CM | POA: Diagnosis not present

## 2017-09-22 DIAGNOSIS — M069 Rheumatoid arthritis, unspecified: Secondary | ICD-10-CM | POA: Diagnosis not present

## 2017-09-22 DIAGNOSIS — D649 Anemia, unspecified: Secondary | ICD-10-CM | POA: Diagnosis not present

## 2017-09-22 DIAGNOSIS — Z4800 Encounter for change or removal of nonsurgical wound dressing: Secondary | ICD-10-CM | POA: Diagnosis not present

## 2017-09-22 DIAGNOSIS — I10 Essential (primary) hypertension: Secondary | ICD-10-CM | POA: Diagnosis not present

## 2017-09-25 DIAGNOSIS — G8220 Paraplegia, unspecified: Secondary | ICD-10-CM | POA: Diagnosis not present

## 2017-09-25 DIAGNOSIS — N401 Enlarged prostate with lower urinary tract symptoms: Secondary | ICD-10-CM | POA: Diagnosis not present

## 2017-09-25 DIAGNOSIS — D649 Anemia, unspecified: Secondary | ICD-10-CM | POA: Diagnosis not present

## 2017-09-25 DIAGNOSIS — N319 Neuromuscular dysfunction of bladder, unspecified: Secondary | ICD-10-CM | POA: Diagnosis not present

## 2017-09-25 DIAGNOSIS — Z4800 Encounter for change or removal of nonsurgical wound dressing: Secondary | ICD-10-CM | POA: Diagnosis not present

## 2017-09-25 DIAGNOSIS — L89154 Pressure ulcer of sacral region, stage 4: Secondary | ICD-10-CM | POA: Diagnosis not present

## 2017-09-25 DIAGNOSIS — I10 Essential (primary) hypertension: Secondary | ICD-10-CM | POA: Diagnosis not present

## 2017-09-25 DIAGNOSIS — M4638 Infection of intervertebral disc (pyogenic), sacral and sacrococcygeal region: Secondary | ICD-10-CM | POA: Diagnosis not present

## 2017-09-25 DIAGNOSIS — M069 Rheumatoid arthritis, unspecified: Secondary | ICD-10-CM | POA: Diagnosis not present

## 2017-09-25 NOTE — Progress Notes (Addendum)
JOSEPHANTHONY, TINDEL ([id number]) Visit Report for 09/21/2017 Abuse/Suicide Risk Screen Details Patient Name: Bradley [name], Bradley [name]. Date of Service: 09/21/2017 12:30 PM Medical Record Number: [id number] Patient Account Number: [id number] Date of Birth/Sex: Mar 17, 1946 (72 y.o. M) Treating RN: Montey Hora Primary Care Yecheskel Kurek: [name] Other Clinician: Referring Jennell Janosik: Referral, Self Treating Arian Mcquitty/Extender: Cathie Olden in Treatment: 0 Abuse/Suicide Risk Screen Items Answer ABUSE/SUICIDE RISK SCREEN: Has anyone close to you tried to hurt or harm you recentlyo No Do you feel uncomfortable with anyone in your familyo No Has anyone forced you do things that you didnot want to doo No Do you have any thoughts of harming yourselfo No Patient displays signs or symptoms of abuse and/or neglect. No Electronic Signature(s) Signed: 09/21/2017 7:42:00 AM By: Montey Hora Entered By: Montey Hora on 09/21/2017 07:42:00 [name] ([id number]) -------------------------------------------------------------------------------- Activities of Daily Living Details Patient Name: QUAY, SIMKIN A. Date of Service: 09/21/2017 12:30 PM Medical Record Number: [id number] Patient Account Number: [id number] Date of Birth/Sex: May 16, 1946 (72 y.o. M) Treating RN: Montey Hora Primary Care Lizmary Nader: [name] Other Clinician: Referring Kelsie Kramp: Referral, Self Treating Alby Schwabe/Extender: Cathie Olden in Treatment: 0 Activities of Daily Living Items Answer Activities of Daily Living (Please select one for each item) Drive Automobile Not Able Take Medications Completely Able Use Telephone Completely Able Care for Appearance Completely Able Use Toilet Need Assistance Bath / Shower Need Assistance Dress Self Need Assistance Feed Self Completely Able Walk Not Able Get In / Out Bed Need Assistance Housework Need [place] for Self Need Assistance Electronic Signature(s) Signed: 09/21/2017 7:42:34 AM By: Montey Hora Entered By: Montey Hora on 09/21/2017 07:42:34 [name] ([id number]) -------------------------------------------------------------------------------- Education Assessment Details Patient Name: [name] Date of Service: 09/21/2017 12:30 PM Medical Record Number: [id number] Patient Account Number: [id number] Date of Birth/Sex: Oct 17, 1945 (72 y.o. M) Treating RN: Montey Hora Primary Care Devereaux Grayson: [name] Other Clinician: Referring Kannon Granderson: Referral, Self Treating Jazariah Teall/Extender: Cathie Olden in Treatment: 0 Primary Learner Assessed: Caregiver HHRN Reason Patient is not Primary Learner: wound location Learning Preferences/Education Level/Primary Language Learning Preference: Printed Material Highest Education Level: College or Above Preferred Language: English Cognitive Barrier Assessment/Beliefs Language Barrier: No Translator Needed: No Memory Deficit: No Emotional Barrier: No Cultural/Religious Beliefs Affecting Medical Care: No Physical Barrier Assessment Impaired Vision: No Impaired Hearing: No Decreased Hand dexterity: No Knowledge/Comprehension Assessment Knowledge Level: Medium Comprehension Level: Medium Ability to understand written Medium instructions: Ability to understand verbal Medium instructions: Motivation Assessment Anxiety Level: Calm Cooperation: Cooperative Education Importance: Acknowledges Need Interest in Health Problems: Asks Questions Perception: Coherent Willingness to Engage in Self- Medium Management Activities: Readiness to Engage in Self- Medium Management Activities: Electronic Signature(s) Signed: 09/21/2017 5:09:22 PM By: Montey Hora Entered By: Montey Hora on 09/21/2017 12:45:17 [name]  ([id number]) -------------------------------------------------------------------------------- Fall Risk Assessment Details Patient Name: [name]. Date of Service: 09/21/2017 12:30 PM Medical Record Number: [id number] Patient Account Number: [id number] Date of Birth/Sex: June 16, 1945 (72 y.o. M) Treating RN: Montey Hora Primary Care Kristyana Notte: [name] Other Clinician: Referring Emanuelle Hammerstrom: Referral, Self Treating Korinne Greenstein/Extender: Cathie Olden in Treatment: 0 Fall Risk Assessment Items Have you had 2 or more falls in the last 12 monthso 0 No Have you had any fall that resulted in injury in the last 12 monthso 0 No FALL RISK ASSESSMENT: History of falling - immediate or within 3 months 0 No Secondary diagnosis 0 No Ambulatory aid None/bed rest/wheelchair/nurse 0  Yes Crutches/cane/walker 0 No Furniture 0 No IV Access/Saline Lock 0 No Gait/Training Normal/bed rest/immobile 0 No Weak 10 Yes Impaired 0 No Mental Status Oriented to own ability 0 Yes Electronic Signature(s) Signed: 09/21/2017 7:42:50 AM By: Montey Hora Entered By: Montey Hora on 09/21/2017 07:42:49 [name] ([id number]) -------------------------------------------------------------------------------- Foot Assessment Details Patient Name: [name] Date of Service: 09/21/2017 12:30 PM Medical Record Number: [id number] Patient Account Number: [id number] Date of Birth/Sex: 10/19/1945 (72 y.o. M) Treating RN: Montey Hora Primary Care Clorinda Wyble: [name] Other Clinician: Referring Navi Erber: Referral, Self Treating Xiomar Crompton/Extender: Cathie Olden in Treatment: 0 Foot Assessment Items Site Locations + = Sensation present, - = Sensation absent, C = Callus, U = Ulcer R = Redness, W = Warmth, M = Maceration, PU = Pre-ulcerative lesion F = Fissure, S = Swelling, D = Dryness Assessment Right: Left: Other Deformity: No No Prior Foot Ulcer: No No Prior Amputation:  No No Charcot Joint: No No Ambulatory Status: Gait: Electronic Signature(s) Signed: 09/21/2017 5:09:22 PM By: Montey Hora Entered By: Montey Hora on 09/21/2017 13:03:32 [name] ([id number]) -------------------------------------------------------------------------------- Nutrition Risk Assessment Details Patient Name: [name] Date of Service: 09/21/2017 12:30 PM Medical Record Number: [id number] Patient Account Number: [id number] Date of Birth/Sex: 1945/11/02 (73 y.o. M) Treating RN: Montey Hora Primary Care Trew Sunde: [name] Other Clinician: Referring Labrittany Wechter: Referral, Self Treating Jaeley Wiker/Extender: Cathie Olden in Treatment: 0 Height (in): 75 Weight (lbs): 209 Body Mass Index (BMI): 26.1 Nutrition Risk Assessment Items NUTRITION RISK SCREEN: I have an illness or condition that made me change the kind and/or amount of 0 No food I eat I eat fewer than two meals per day 0 No I eat few fruits and vegetables, or milk products 0 No I have three or more drinks of beer, liquor or wine almost every day 0 No I have tooth or mouth problems that make it hard for me to eat 0 No I don't always have enough money to buy the food I need 0 No I eat alone most of the time 0 No I take three or more different prescribed or over-the-counter drugs a day 1 Yes Without wanting to, I have lost or gained 10 pounds in the last six months 0 No I am not always physically able to shop, cook and/or feed myself 0 No Nutrition Protocols Good Risk Protocol 0 No interventions needed Moderate Risk Protocol Electronic Signature(s) Signed: 09/21/2017 7:42:57 AM By: Montey Hora Entered By: Montey Hora on 09/21/2017 07:42:57

## 2017-09-25 NOTE — Progress Notes (Addendum)
Bradley [name] ([id number]) Visit Report for 09/21/2017 Allergy List Details Patient Name: Bradley [name], Bradley [name]. Date of Service: 09/21/2017 12:30 PM Medical Record Number: [id number] Patient Account Number: [id number] Date of Birth/Sex: 1945/09/12 (72 y.o. M) Treating RN: Montey Hora Primary Care Jaely Silman: [name] Other Clinician: Referring Antone Summons: Referral, Self Treating Nakeda Lebron/Extender: Lawanda Cousins Weeks in Treatment: 0 Allergies Active Allergies No Known Allergies Allergy Notes Electronic Signature(s) Signed: 09/21/2017 7:41:08 AM By: Montey Hora Entered By: Montey Hora on 09/21/2017 07:41:08 [name] ([id number]) -------------------------------------------------------------------------------- Arrival Information Details Patient Name: Bradley Loa A. Date of Service: 09/21/2017 12:30 PM Medical Record Number: [id number] Patient Account Number: [id number] Date of Birth/Sex: 1945-06-20 (72 y.o. M) Treating RN: Montey Hora Primary Care Lakeidra Reliford: [name] Other Clinician: Referring Donise Woodle: Referral, Self Treating Finleigh Cheong/Extender: Cathie Olden in Treatment: 0 Visit Information Patient Arrived: Wheel Chair Arrival Time: 12:42 Accompanied By: sister Transfer Assistance: Civil Service fast streamer Patient Identification Verified: Yes Secondary Verification Process Completed: Yes History Since Last Visit Added or deleted any medications: No Any new allergies or adverse reactions: No Had a fall or experienced change in activities of daily living that may affect risk of falls: No Signs or symptoms of abuse/neglect since last visito No Hospitalized since last visit: No Implantable device outside of the clinic excluding cellular tissue based products placed in the center since last visit: No Has Dressing in Place as Prescribed: Yes Electronic Signature(s) Signed: 09/21/2017 5:09:22 PM By: Montey Hora Entered By: Montey Hora on 09/21/2017  12:42:22 [name] ([id number]) -------------------------------------------------------------------------------- Clinic Level of Care Assessment Details Patient Name: Bradley Loa A. Date of Service: 09/21/2017 12:30 PM Medical Record Number: [id number] Patient Account Number: [id number] Date of Birth/Sex: 1945-12-21 (72 y.o. M) Treating RN: Ahmed Prima Primary Care Ailana Cuadrado: [name] Other Clinician: Referring Melven Stockard: Referral, Self Treating Esaw Knippel/Extender: Cathie Olden in Treatment: 0 Clinic Level of Care Assessment Items TOOL 1 Quantity Score X - Use when EandM and Procedure is performed on INITIAL visit 1 0 ASSESSMENTS - Nursing Assessment / Reassessment X - General Physical Exam (combine w/ comprehensive assessment (listed just below) when 1 20 performed on new pt. evals) X- 1 25 Comprehensive Assessment (HX, ROS, Risk Assessments, Wounds Hx, etc.) ASSESSMENTS - Wound and Skin Assessment / Reassessment []  - Dermatologic / Skin Assessment (not related to wound area) 0 ASSESSMENTS - Ostomy and/or Continence Assessment and Care []  - Incontinence Assessment and Management 0 []  - 0 Ostomy Care Assessment and Management (repouching, etc.) PROCESS - Coordination of Care []  - Simple Patient / Family Education for ongoing care 0 X- 1 20 Complex (extensive) Patient / Family Education for ongoing care X- 1 10 Staff obtains Programmer, systems, Records, Test Results / Process Orders X- 1 10 Staff telephones HHA, Nursing Homes / Clarify orders / etc []  - 0 Routine Transfer to another Facility (non-emergent condition) []  - 0 Routine Hospital Admission (non-emergent condition) X- 1 15 New Admissions / Biomedical engineer / Ordering NPWT, Apligraf, etc. []  - 0 Emergency Hospital Admission (emergent condition) PROCESS - Special Needs []  - Pediatric / Minor Patient Management 0 []  - 0 Isolation Patient Management []  - 0 Hearing / Language / Visual special  needs []  - 0 Assessment of Community assistance (transportation, D/C planning, etc.) []  - 0 Additional assistance / Altered mentation []  - 0 Support Surface(s) Assessment (bed, cushion, seat, etc.) DARRIAN, GRZELAK A. ([id number]) INTERVENTIONS - Miscellaneous []  - External ear exam 0 X- 1 10 Patient Transfer (multiple staff / Civil Service fast streamer / Similar  devices) []  - 0 Simple Staple / Suture removal (25 or less) []  - 0 Complex Staple / Suture removal (26 or more) []  - 0 Hypo/Hyperglycemic Management (do not check if billed separately) []  - 0 Ankle / Brachial Index (ABI) - do not check if billed separately Has the patient been seen at the hospital within the last three years: Yes Total Score: 110 Level Of Care: New/Established - Level 3 Electronic Signature(s) Signed: 09/22/2017 4:41:07 PM By: Alric Quan Entered By: Alric Quan on 09/21/2017 17:15:33 [name] ([id number]) -------------------------------------------------------------------------------- Encounter Discharge Information Details Patient Name: Bradley Loa A. Date of Service: 09/21/2017 12:30 PM Medical Record Number: [id number] Patient Account Number: [id number] Date of Birth/Sex: 07/07/1945 (72 y.o. M) Treating RN: Ahmed Prima Primary Care Theola Cuellar: [name] Other Clinician: Referring Deem Marmol: Referral, Self Treating Sadat Sliwa/Extender: Cathie Olden in Treatment: 0 Encounter Discharge Information Items Schedule Follow-up Appointment: No Medication Reconciliation completed and No provided to Patient/Care Kendyl Festa: Provided on Clinical Summary of Care: 09/21/2017 Form Type Recipient Paper Patient JG Electronic Signature(s) Signed: 09/22/2017 3:53:00 PM By: Ruthine Dose Entered By: Ruthine Dose on 09/21/2017 14:18:14 [name] ([id number]) -------------------------------------------------------------------------------- Lower Extremity Assessment Details Patient Name:  Bradley Loa A. Date of Service: 09/21/2017 12:30 PM Medical Record Number: [id number] Patient Account Number: [id number] Date of Birth/Sex: 1946/01/24 (72 y.o. M) Treating RN: Montey Hora Primary Care Rondall Radigan: [name] Other Clinician: Referring Zuzanna Maroney: Referral, Self Treating Karinne Schmader/Extender: Cathie Olden in Treatment: 0 Electronic Signature(s) Signed: 09/21/2017 5:09:22 PM By: Montey Hora Entered By: Montey Hora on 09/21/2017 13:02:37 [name] ([id number]) -------------------------------------------------------------------------------- Multi Wound Chart Details Patient Name: Bradley Loa A. Date of Service: 09/21/2017 12:30 PM Medical Record Number: [id number] Patient Account Number: [id number] Date of Birth/Sex: 03-07-1946 (72 y.o. M) Treating RN: Ahmed Prima Primary Care Magally Vahle: [name] Other Clinician: Referring Sophiya Morello: Referral, Self Treating Catalena Stanhope/Extender: Cathie Olden in Treatment: 0 Vital Signs Height(in): Pulse(bpm): 13 Weight(lbs): Blood Pressure(mmHg): 125/59 Body Mass Index(BMI): Temperature(F): 98.3 Respiratory Rate 16 (breaths/min): Photos: [9:No Photos] [N/A:N/A] Wound Location: [9:Lumbar spine - Midline] [N/A:N/A] Wounding Event: [9:Pressure Injury] [N/A:N/A] Primary Etiology: [9:Pressure Ulcer] [N/A:N/A] Comorbid History: [9:Anemia, Hypertension, History of pressure wounds, Rheumatoid Arthritis, Paraplegia] [N/A:N/A] Date Acquired: [9:06/06/2014] [N/A:N/A] Weeks of Treatment: [9:0] [N/A:N/A] Wound Status: [9:Open] [N/A:N/A] Measurements L x W x D [9:12.9x8.1x4.5] [N/A:N/A] (cm) Area (cm) : [9:82.066] [N/A:N/A] Volume (cm) : [6:606.301] [N/A:N/A] Position 1 (o'clock): [9:12] Maximum Distance 1 (cm): [9:1.5] Tunneling: [9:Yes] [N/A:N/A] Classification: [9:Category/Stage IV] [N/A:N/A] Exudate Amount: [9:Large] [N/A:N/A] Exudate Type: [9:Purulent] [N/A:N/A] Exudate Color: [9:yellow,  brown, green] [N/A:N/A] Wound Margin: [9:Flat and Intact] [N/A:N/A] Granulation Amount: [9:Medium (34-66%)] [N/A:N/A] Granulation Quality: [9:Red] [N/A:N/A] Necrotic Amount: [9:Medium (34-66%)] [N/A:N/A] Exposed Structures: [9:Fat Layer (Subcutaneous Tissue) Exposed: Yes Muscle: Yes Fascia: No Tendon: No Joint: No Bone: No] [N/A:N/A] Periwound Skin Texture: [9:Scarring: Yes Excoriation: No Induration: No Callus: No] [N/A:N/A] Crepitus: No Rash: No Periwound Skin Moisture: Maceration: Yes N/A N/A Dry/Scaly: No Periwound Skin Color: Atrophie Blanche: No N/A N/A Cyanosis: No Ecchymosis: No Erythema: No Hemosiderin Staining: No Mottled: No Pallor: No Rubor: No Temperature: No Abnormality N/A N/A Tenderness on Palpation: No N/A N/A Wound Preparation: Ulcer Cleansing: N/A N/A Rinsed/Irrigated with Saline Topical Anesthetic Applied: Other: Lidocaine 4% Treatment Notes Electronic Signature(s) Signed: 09/21/2017 5:13:37 PM By: Alric Quan Entered By: Alric Quan on 09/21/2017 13:27:37 [name] ([id number]) -------------------------------------------------------------------------------- [place] Details Patient Name: TIMOTH, SCHARA A. Date of Service: 09/21/2017 12:30 PM Medical Record Number: [id number] Patient Account Number: [id number] Date  of Birth/Sex: [birth year] (72 y.o. M) Treating RN: Ahmed Prima Primary Care Luwanda Starr: [name] Other Clinician: Referring Mattisyn Cardona: Referral, Self Treating Maylani Embree/Extender: Cathie Olden in Treatment: 0 Active Inactive ` Abuse / Safety / Falls / Self Care Management Nursing Diagnoses: Potential for falls Goals: Patient will not experience any injury related to falls Date Initiated: 09/21/2017 Target Resolution Date: 01/13/2018 Goal Status: Active Interventions: Assess Activities of Daily Living upon admission and as needed Assess fall risk on admission and as needed Assess:  immobility, friction, shearing, incontinence upon admission and as needed Assess impairment of mobility on admission and as needed per policy Assess personal safety and home safety (as indicated) on admission and as needed Assess self care needs on admission and as needed Notes: ` Nutrition Nursing Diagnoses: Imbalanced nutrition Potential for alteratiion in Nutrition/Potential for imbalanced nutrition Goals: Patient/caregiver agrees to and verbalizes understanding of need to use nutritional supplements and/or vitamins as prescribed Date Initiated: 09/21/2017 Target Resolution Date: 01/13/2018 Goal Status: Active Interventions: Assess patient nutrition upon admission and as needed per policy Notes: ` Orientation to the Wound Care Program Nursing Diagnoses: Knowledge deficit related to the wound healing center program JEFFREY, GRAEFE ([id number]) Goals: Patient/caregiver will verbalize understanding of the [place] Date Initiated: 09/21/2017 Target Resolution Date: 10/14/2017 Goal Status: Active Interventions: Provide education on orientation to the wound center Notes: ` Pressure Nursing Diagnoses: Knowledge deficit related to causes and risk factors for pressure ulcer development Knowledge deficit related to management of pressures ulcers Potential for impaired tissue integrity related to pressure, friction, moisture, and shear Goals: Patient will remain free from development of additional pressure ulcers Date Initiated: 09/21/2017 Target Resolution Date: 01/13/2018 Goal Status: Active Patient/caregiver will verbalize risk factors for pressure ulcer development Date Initiated: 09/21/2017 Target Resolution Date: 02/10/2018 Goal Status: Active Patient/caregiver will verbalize understanding of pressure ulcer management Date Initiated: 09/21/2017 Target Resolution Date: 12/09/2017 Goal Status: Active Interventions: Assess: immobility, friction, shearing,  incontinence upon admission and as needed Assess offloading mechanisms upon admission and as needed Assess potential for pressure ulcer upon admission and as needed Provide education on pressure ulcers Notes: ` Wound/Skin Impairment Nursing Diagnoses: Impaired tissue integrity Knowledge deficit related to ulceration/compromised skin integrity Goals: Ulcer/skin breakdown will have a volume reduction of 80% by week 12 Date Initiated: 09/21/2017 Target Resolution Date: 04/14/2018 Goal Status: Active Interventions: Assess patient/caregiver ability to perform ulcer/skin care regimen upon admission and as needed Assess ulceration(s) every visit JOEPH, SZATKOWSKI ([id number]) Notes: Electronic Signature(s) Signed: 09/21/2017 5:13:37 PM By: Alric Quan Entered By: Alric Quan on 09/21/2017 13:27:22 [name] ([id number]) -------------------------------------------------------------------------------- Pain Assessment Details Patient Name: Bradley Loa A. Date of Service: 09/21/2017 12:30 PM Medical Record Number: [id number] Patient Account Number: [id number] Date of Birth/Sex: Sep 27, 1945 (72 y.o. M) Treating RN: Montey Hora Primary Care Savvy Peeters: [name] Other Clinician: Referring Ottie Tillery: Referral, Self Treating Jadalynn Burr/Extender: Cathie Olden in Treatment: 0 Active Problems Location of Pain Severity and Description of Pain Patient Has Paino No Site Locations Pain Management and Medication Current Pain Management: Electronic Signature(s) Signed: 09/21/2017 5:09:22 PM By: Montey Hora Entered By: Montey Hora on 09/21/2017 12:43:53 [name] ([id number]) -------------------------------------------------------------------------------- Wound Assessment Details Patient Name: Bradley Loa A. Date of Service: 09/21/2017 12:30 PM Medical Record Number: [id number] Patient Account Number: [id number] Date of Birth/Sex: December 17, 1945 (72 y.o.  M) Treating RN: Montey Hora Primary Care Rosalina Dingwall: [name] Other Clinician: Referring Briseyda Fehr: Referral, Self Treating Tylor Gambrill/Extender: Cathie Olden in Treatment: 0 Wound Status Wound  Number: 9 Primary Pressure Ulcer Etiology: Wound Location: Lumbar spine - Midline Wound Open Wounding Event: Pressure Injury Status: Date Acquired: 06/06/2014 Comorbid Anemia, Hypertension, History of pressure Weeks Of Treatment: 0 History: wounds, Rheumatoid Arthritis, Paraplegia Clustered Wound: No Photos Photo Uploaded By: Montey Hora on 09/21/2017 15:06:46 Wound Measurements Length: (cm) 12.9 Width: (cm) 8.1 Depth: (cm) 4.5 Area: (cm) 82.066 Volume: (cm) 369.298 % Reduction in Area: % Reduction in Volume: Tunneling: Yes Position (o'clock): 12 Maximum Distance: (cm) 1.5 Undermining: No Wound Description Classification: Category/Stage IV Wound Margin: Flat and Intact Exudate Amount: Large Exudate Type: Purulent Exudate Color: yellow, brown, green Foul Odor After Cleansing: No Slough/Fibrino Yes Wound Bed Granulation Amount: Medium (34-66%) Exposed Structure Granulation Quality: Red Fascia Exposed: No Necrotic Amount: Medium (34-66%) Fat Layer (Subcutaneous Tissue) Exposed: Yes Necrotic Quality: Adherent Slough Tendon Exposed: No Muscle Exposed: Yes Necrosis of Muscle: No Joint Exposed: No Bone Exposed: No Periwound Skin Texture Ryall, Nithin A. ([id number]) Texture Color No Abnormalities Noted: No No Abnormalities Noted: No Callus: No Atrophie Blanche: No Crepitus: No Cyanosis: No Excoriation: No Ecchymosis: No Induration: No Erythema: No Rash: No Hemosiderin Staining: No Scarring: Yes Mottled: No Pallor: No Moisture Rubor: No No Abnormalities Noted: No Dry / Scaly: No Temperature / Pain Maceration: Yes Temperature: No Abnormality Wound Preparation Ulcer Cleansing: Rinsed/Irrigated with Saline Topical Anesthetic Applied: Other:  Lidocaine 4%, Electronic Signature(s) Signed: 09/21/2017 5:09:22 PM By: Montey Hora Entered By: Montey Hora on 09/21/2017 13:00:16 [name] ([id number]) -------------------------------------------------------------------------------- [place] Details Patient Name: Bradley Loa A. Date of Service: 09/21/2017 12:30 PM Medical Record Number: [id number] Patient Account Number: [id number] Date of Birth/Sex: Mar 24, 1946 (72 y.o. M) Treating RN: Montey Hora Primary Care Che Rachal: [name] Other Clinician: Referring Melba Araki: Referral, Self Treating Tadao Emig/Extender: Cathie Olden in Treatment: 0 Vital Signs Time Taken: 12:45 Temperature (F): 98.3 Pulse (bpm): 89 Respiratory Rate (breaths/min): 16 Blood Pressure (mmHg): 125/59 Reference Range: 80 - 120 mg / dl Electronic Signature(s) Signed: 09/21/2017 5:09:22 PM By: Montey Hora Entered By: Montey Hora on 09/21/2017 12:45:50

## 2017-09-26 NOTE — Progress Notes (Signed)
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 history of End Stage Renal Disease Immunological Denies history of Lupus Erythematosus, Raynaud s, Scleroderma Integumentary (Skin) Patient has history of History of pressure wounds Musculoskeletal Patient has history of Rheumatoid Arthritis Denies history of  Gout, Osteoarthritis, Osteomyelitis Neurologic Patient has history of Paraplegia Denies history of Dementia, Neuropathy, Quadriplegia, Seizure Disorder Oncologic Denies history of Received Chemotherapy, Received Radiation Psychiatric Denies history of Anorexia/bulimia, Confinement Anxiety Hospitalization/Surgery History - 06/29/2013, [facility], Hemorroids. Medical And Surgical History Notes Genitourinary frequent urination Objective Constitutional Vitals Time Taken: 12:45 PM, Temperature: 98.3 F, Pulse: 89 bpm, Respiratory Rate: 16 breaths/min, Blood Pressure: 125/59 mmHg. Respiratory respirations are even and unlabored. Musculoskeletal hoyer lift; motorized chair. Psychiatric oriented x4. Integumentary (Hair, Skin) sacral ulcer with macerated and fibroitc edges; surface debris, bleeds easily with debridement. Wound #9 status is Open. Original cause of wound was Pressure Injury. The wound is located on the Midline Coccyx. The wound measures 12.9cm length x 8.1cm width x 4.5cm depth; 82.066cm^2 area and 369.298cm^3 volume. There is muscle and Fat Layer (Subcutaneous Tissue) Exposed exposed. There is no undermining noted, however, there is tunneling at 12:00 with a maximum distance of 1.5cm. There is a large amount of purulent drainage noted. The wound margin is flat and intact. Bradley [name], Bradley [name] A. ([id number]) There is medium (34-66%) red granulation within the wound bed. There is a medium (34-66%) amount of necrotic tissue within the wound bed including Adherent Slough. The periwound skin appearance exhibited: Scarring, Maceration. The periwound skin appearance did not exhibit: Callus, Crepitus, Excoriation, Induration, Rash, Dry/Scaly, Atrophie Blanche, Cyanosis, Ecchymosis, Hemosiderin Staining, Mottled, Pallor, Rubor, Erythema. Periwound temperature was noted as No Abnormality. 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 There was a Excisional Skin/Subcutaneous Tissue Debridement with a total area of 64 sq cm performed by Lawanda Cousins, NP. With the following instrument(s): Curette. to remove Viable and Non-Viable tissue/material Material removed includes Subcutaneous Tissue, and Slough, Fibrin/Exudate, and [place] after achieving pain control using Lidocaine 4% Topical Solution. No specimens were taken. A time out was conducted at 13:35, prior to the start of the procedure. A Minimum amount of bleeding was controlled with Silver Nitrate. The procedure was tolerated well with a pain level of 0 throughout and a pain level of 0 following the procedure. Post Debridement Measurements: 12.9cm length x 8.1cm width x 5cm depth; 410.331cm^3 volume. Post debridement Stage noted as Category/Stage IV. Character of Wound/Ulcer Post Debridement requires further debridement. Post procedure Diagnosis Wound #9: Same as Pre-Procedure Plan Wound  Cleansing: Wound #9 Midline Coccyx: Clean wound with Normal Saline. Cleanse wound with mild soap and water Anesthetic (add to Medication List): Wound #9 Midline Coccyx: Topical Lidocaine 4% cream applied to wound bed prior to debridement (In Clinic Only). Skin Barriers/Peri-Wound Care: Wound #9 Midline Coccyx: Skin Prep Primary Wound Dressing: Wound #9 Midline Coccyx: Other: - Dakins Solution moistened gauze Secondary Dressing: HARLYN, ITALIANO ([id number]) Wound #9 Midline Coccyx: ABD pad Dry Gauze XtraSorb Other - medipore tape Dressing Change Frequency: Wound #9 Midline Coccyx: Change dressing every day. Follow-up Appointments: Wound #9 Midline Coccyx: Return Appointment in 2 weeks. Off-Loading: Wound #9 Midline Coccyx: Turn and reposition every 2 hours Additional Orders / Instructions: Wound #9  Midline Coccyx: Vitamin A; Vitamin C, Zinc Increase protein intake. Home Health: Wound #9 Midline Coccyx: Continue Home Health Visits - [place] Nurse may visit PRN to address patient s wound care needs. FACE TO FACE ENCOUNTER: MEDICARE and MEDICAID PATIENTS: I certify that this patient is under my care and that I had a face-to-face encounter that meets the physician face-to-face encounter requirements with this patient on this date. The encounter with the patient was in whole or in part for the following MEDICAL CONDITION: (primary reason for [place]) MEDICAL NECESSITY: I certify, that based on my findings, NURSING services are a medically necessary home health service. HOME BOUND STATUS: I certify that my clinical findings support that this patient is homebound (i.e., Due to illness or injury, pt requires aid of supportive devices such as crutches, cane, wheelchairs, walkers, the use of special transportation or the assistance of another person to leave their place of residence. There is a normal inability to leave the home and doing so requires considerable and taxing effort. Other absences are for medical reasons / religious services and are infrequent or of short duration when for other reasons). If current dressing causes regression in wound condition, may D/C ordered dressing product/s and apply Normal Saline Moist Dressing daily until next [place] / Other MD appointment. [place] of regression in wound condition at [phone]. Please direct any NON-WOUND related issues/requests for orders to patient's Primary Care Physician Negative Pressure Wound Therapy: Wound #9 Midline Coccyx: Discontinue NPWT. The following medication(s) was prescribed: lidocaine topical 4 % cream 1 1 cream topical was prescribed at facility Dakin's Solution miscellaneous 0.25 % solution wet to dry dressing daily starting 09/22/2017 Electronic Signature(s) Signed:  09/21/2017 3:37:27 PM By: Lawanda Cousins Entered By: Lawanda Cousins on 09/21/2017 15:37:27 Bradley [name] ([id number]) -------------------------------------------------------------------------------- ROS/PFSH Details Patient Name: Bradley Loa A. Date of Service: 09/21/2017 12:30 PM Medical Record Number: [id number] Patient Account Number: [id number] Date of Birth/Sex: [birth year] (72 y.o. M) Treating RN: Montey Hora Primary Care Provider: [name] Other Clinician: Referring Provider: Referral, Self Treating Provider/Extender: Cathie Olden in Treatment: 0 Label Progress Note Print Version as History and Physical for this encounter Information Obtained From Patient Wound History Do you currently have one or more open woundso Yes How many open wounds do you currently haveo 1 Approximately how long have you had your woundso 4 years How have you been treating your wound(s) until nowo NPWT Has your wound(s) ever healed and then re-openedo No Have you had any lab work done in the past montho No Have you tested positive for an antibiotic resistant organism (MRSA, VRE)o No Have you tested positive for osteomyelitis (bone infection)o Yes Date: 02/14/2017 Have you had any tests for circulation on your legso No Eyes Medical  History: Negative for: Cataracts; Glaucoma; Optic Neuritis Hematologic/Lymphatic Medical History: Positive for: Anemia Negative for: Hemophilia; Human Immunodeficiency Virus; Lymphedema; Sickle Cell Disease Respiratory Medical History: Negative for: Aspiration; Asthma; Chronic Obstructive Pulmonary Disease (COPD); Pneumothorax; Sleep Apnea; Tuberculosis Cardiovascular Medical History: Positive for: Hypertension Gastrointestinal Medical History: Negative for: Cirrhosis ; Colitis; Crohnos; Hepatitis A; Hepatitis B; Hepatitis C Endocrine Medical History: Negative for: Type I Diabetes; Type II Diabetes Genitourinary HASON, OFARRELL  ([id number]) Medical History: Negative for: End Stage Renal Disease Past Medical History Notes: frequent urination Immunological Medical History: Negative for: Lupus Erythematosus; Raynaudos; Scleroderma Integumentary (Skin) Medical History: Positive for: History of pressure wounds Musculoskeletal Medical History: Positive for: Rheumatoid Arthritis Negative for: Gout; Osteoarthritis; Osteomyelitis Neurologic Medical History: Positive for: Paraplegia Negative for: Dementia; Neuropathy; Quadriplegia; Seizure Disorder Oncologic Medical History: Negative for: Received Chemotherapy; Received Radiation Psychiatric Medical History: Negative for: Anorexia/bulimia; Confinement Anxiety Immunizations Pneumococcal Vaccine: Received Pneumococcal Vaccination: Yes Immunization Notes: up to date Implantable Devices Hospitalization / Surgery History Name of Hospital Purpose of Hospitalization/Surgery Date [facility] Hemorroids 06/29/2013 Family and Social History Cancer: Yes - Mother; Diabetes: No; Heart Disease: Yes - Mother,Father; Hereditary Spherocytosis: No; Hypertension: No; Kidney Disease: No; Lung Disease: No; Seizures: Yes - Child; Stroke: No; Thyroid Problems: No; Tuberculosis: No; Former smoker; Marital Status - Single; Alcohol Use: Never; Drug Use: No History; Caffeine Use: Moderate - coffee; Financial Concerns: No; Food, Clothing or Shelter Needs: No; Support System Lacking: No; Transportation Concerns: No; Advanced Directives: No; Patient does not want information on Advanced Directives; Do not resuscitate: No; Living Will: No; Medical Power of Attorney: No Electronic Signature(s) FORNEY, KLEINPETER ([id number]) Signed: 09/21/2017 3:41:30 PM By: Lawanda Cousins Signed: 09/21/2017 5:09:22 PM By: Montey Hora Entered By: Montey Hora on 09/21/2017 12:51:19 Bradley [name] ([id number]) -------------------------------------------------------------------------------- SuperBill  Details Patient Name: Bradley Loa A. Date of Service: 09/21/2017 Medical Record Number: [id number] Patient Account Number: [id number] Date of Birth/Sex: 1945/11/13 (72 y.o. M) Treating RN: Ahmed Prima Primary Care Provider: [name] Other Clinician: Referring Provider: Referral, Self Treating Provider/Extender: Cathie Olden in Treatment: 0 Diagnosis Coding ICD-10 Codes Code Description L89.154 Pressure ulcer of sacral region, stage 4 G82.20 Paraplegia, unspecified M86.68 Other chronic osteomyelitis, other site Facility Procedures CPT4 Code: [id number] Description: [place] VISIT-LEV 3 EST PT Modifier: Quantity: 1 CPT4 Code: [id number] Description: 11042 - DEB SUBQ TISSUE 20 SQ CM/< ICD-10 Diagnosis Description L89.154 Pressure ulcer of sacral region, stage 4 Modifier: Quantity: 1 CPT4 Code: [id number] Description: 11045 - DEB SUBQ TISS EA ADDL 20CM ICD-10 Diagnosis Description L89.154 Pressure ulcer of sacral region, stage 4 Modifier: Quantity: 3 Physician Procedures CPT4 Code: [id number] Description: 11042 - WC PHYS SUBQ TISS 20 SQ CM ICD-10 Diagnosis Description L89.154 Pressure ulcer of sacral region, stage 4 Modifier: Quantity: 1 CPT4 Code: [id number] Description: 70017 - WC PHYS SUBQ TISS EA ADDL 20 CM ICD-10 Diagnosis Description L89.154 Pressure ulcer of sacral region, stage 4 Modifier: Quantity: 3 Electronic Signature(s) Signed: 09/21/2017 5:15:42 PM By: Alric Quan Previous Signature: 09/21/2017 3:37:50 PM Version By: Lawanda Cousins Entered By: Alric Quan on 09/21/2017 17:15:42

## 2017-09-27 DIAGNOSIS — N319 Neuromuscular dysfunction of bladder, unspecified: Secondary | ICD-10-CM | POA: Diagnosis not present

## 2017-09-27 DIAGNOSIS — D649 Anemia, unspecified: Secondary | ICD-10-CM | POA: Diagnosis not present

## 2017-09-27 DIAGNOSIS — I10 Essential (primary) hypertension: Secondary | ICD-10-CM | POA: Diagnosis not present

## 2017-09-27 DIAGNOSIS — N401 Enlarged prostate with lower urinary tract symptoms: Secondary | ICD-10-CM | POA: Diagnosis not present

## 2017-09-27 DIAGNOSIS — L89154 Pressure ulcer of sacral region, stage 4: Secondary | ICD-10-CM | POA: Diagnosis not present

## 2017-09-27 DIAGNOSIS — M4638 Infection of intervertebral disc (pyogenic), sacral and sacrococcygeal region: Secondary | ICD-10-CM | POA: Diagnosis not present

## 2017-09-27 DIAGNOSIS — Z4800 Encounter for change or removal of nonsurgical wound dressing: Secondary | ICD-10-CM | POA: Diagnosis not present

## 2017-09-27 DIAGNOSIS — G8220 Paraplegia, unspecified: Secondary | ICD-10-CM | POA: Diagnosis not present

## 2017-09-27 DIAGNOSIS — M069 Rheumatoid arthritis, unspecified: Secondary | ICD-10-CM | POA: Diagnosis not present

## 2017-09-28 ENCOUNTER — Encounter: Payer: Medicare HMO | Admitting: Nurse Practitioner

## 2017-09-28 DIAGNOSIS — G8220 Paraplegia, unspecified: Secondary | ICD-10-CM | POA: Diagnosis not present

## 2017-09-28 DIAGNOSIS — M069 Rheumatoid arthritis, unspecified: Secondary | ICD-10-CM | POA: Diagnosis not present

## 2017-09-28 DIAGNOSIS — I10 Essential (primary) hypertension: Secondary | ICD-10-CM | POA: Diagnosis not present

## 2017-09-28 DIAGNOSIS — Z933 Colostomy status: Secondary | ICD-10-CM | POA: Diagnosis not present

## 2017-09-28 DIAGNOSIS — M8668 Other chronic osteomyelitis, other site: Secondary | ICD-10-CM | POA: Diagnosis not present

## 2017-09-28 DIAGNOSIS — Z87891 Personal history of nicotine dependence: Secondary | ICD-10-CM | POA: Diagnosis not present

## 2017-09-28 DIAGNOSIS — L89154 Pressure ulcer of sacral region, stage 4: Secondary | ICD-10-CM | POA: Diagnosis not present

## 2017-09-30 DIAGNOSIS — D649 Anemia, unspecified: Secondary | ICD-10-CM | POA: Diagnosis not present

## 2017-09-30 DIAGNOSIS — Z4800 Encounter for change or removal of nonsurgical wound dressing: Secondary | ICD-10-CM | POA: Diagnosis not present

## 2017-09-30 DIAGNOSIS — L89154 Pressure ulcer of sacral region, stage 4: Secondary | ICD-10-CM | POA: Diagnosis not present

## 2017-09-30 DIAGNOSIS — N401 Enlarged prostate with lower urinary tract symptoms: Secondary | ICD-10-CM | POA: Diagnosis not present

## 2017-09-30 DIAGNOSIS — I10 Essential (primary) hypertension: Secondary | ICD-10-CM | POA: Diagnosis not present

## 2017-09-30 DIAGNOSIS — G8220 Paraplegia, unspecified: Secondary | ICD-10-CM | POA: Diagnosis not present

## 2017-09-30 DIAGNOSIS — M4638 Infection of intervertebral disc (pyogenic), sacral and sacrococcygeal region: Secondary | ICD-10-CM | POA: Diagnosis not present

## 2017-09-30 DIAGNOSIS — N319 Neuromuscular dysfunction of bladder, unspecified: Secondary | ICD-10-CM | POA: Diagnosis not present

## 2017-09-30 DIAGNOSIS — M069 Rheumatoid arthritis, unspecified: Secondary | ICD-10-CM | POA: Diagnosis not present

## 2017-10-02 DIAGNOSIS — K435 Parastomal hernia without obstruction or  gangrene: Secondary | ICD-10-CM | POA: Diagnosis not present

## 2017-10-03 DIAGNOSIS — S31809A Unspecified open wound of unspecified buttock, initial encounter: Secondary | ICD-10-CM | POA: Diagnosis not present

## 2017-10-03 DIAGNOSIS — Z933 Colostomy status: Secondary | ICD-10-CM | POA: Diagnosis not present

## 2017-10-03 DIAGNOSIS — L89153 Pressure ulcer of sacral region, stage 3: Secondary | ICD-10-CM | POA: Diagnosis not present

## 2017-10-03 NOTE — Progress Notes (Signed)
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 of Anorexia/bulimia, Confinement Anxiety Hospitalization/Surgery History - 06/29/2013, [facility], Hemorroids. Medical And Surgical History  Notes Genitourinary frequent urination Objective Constitutional Vitals Time Taken: 10:42 AM, Temperature: 97.8 F, Pulse: 66 bpm, Respiratory Rate: 16 breaths/min, Blood Pressure: 122/70 mmHg. Integumentary (Hair, Skin) Wound #9 status is Open. Original cause of wound was Pressure Injury. The wound is located on the Midline Coccyx. The wound measures 10.2cm length x 13.5cm width x 4cm depth; 108.149cm^2 area and 432.597cm^3 volume. There is muscle and Fat Layer (Subcutaneous Tissue) Exposed exposed. There is undermining starting at 1:00 and ending at 3:00 with a maximum distance of 0.5cm. There is additional undermining and at 5:00 and ending at 7:00 with a maximum distance of 0.6cm. There is a large amount of purulent drainage noted. The wound margin is epibole. There is large (67-100%) red, hyper - granulation within the wound bed. There is a small (1-33%) amount of necrotic tissue within the wound bed including Adherent Slough. The periwound skin appearance exhibited: Scarring, Maceration. The periwound skin appearance did not exhibit: Callus, Crepitus, Excoriation, Induration, Rash, Dry/Scaly, Atrophie Blanche, Cyanosis, Ecchymosis, Hemosiderin Staining, Mottled, Pallor, Rubor, Erythema. Periwound temperature was noted as No Abnormality. Assessment Active Problems MASAO, JUNKER ([id number]) ICD-10 L89.154 - Pressure ulcer of sacral region, stage 4 G82.20 - Paraplegia, unspecified M86.68 - Other chronic osteomyelitis, other site Procedures Wound #9 Pre-procedure diagnosis of Wound #9 is a Pressure Ulcer located on the Midline Coccyx . There was a Excisional Skin/Subcutaneous Tissue Debridement with a total area of 137.7 sq cm performed by Lawanda Cousins, NP. With the following instrument(s): Curette. to remove Viable and Non-Viable tissue/material Material removed includes Subcutaneous Tissue, and Slough, Fibrin/Exudate, and [place] after achieving pain control using Lidocaine 4%  Topical Solution. No specimens were taken. A time out was conducted at 11:27, prior to the start of the procedure. A Minimum amount of bleeding was controlled with Pressure. The procedure was tolerated well with a pain level of 0 throughout and a pain level of 0 following the procedure. Post Debridement Measurements: 10.2cm length x 13.5cm width x 4.1cm depth; 443.412cm^3 volume. Post debridement Stage noted as Category/Stage IV. Character of Wound/Ulcer Post Debridement requires further debridement. Post procedure Diagnosis Wound #9: Same as Pre-Procedure Plan Wound Cleansing: Wound #9 Midline Coccyx: Clean wound with Normal Saline. Cleanse wound with mild soap and water Anesthetic (add to Medication List): Wound #9 Midline Coccyx: Topical Lidocaine 4% cream applied to wound bed  prior to debridement (In Clinic Only). Skin Barriers/Peri-Wound Care: Wound #9 Midline Coccyx: Skin Prep Primary Wound Dressing: Wound #9 Midline Coccyx: Other: - Dakins Solution moistened gauze Secondary Dressing: Wound #9 Midline Coccyx: ABD pad Dry Gauze XtraSorb Other - medipore tape Dressing Change Frequency: Wound #9 Midline Coccyx: Change dressing every day. Follow-up Appointments: Wound #9 Midline Coccyx: Return Appointment in 2 weeks. Off-Loading: Wound #9 Midline Coccyx: Bradley [name], Bradley A. ([id number]) Turn and reposition every 2 hours Additional Orders / Instructions: Wound #9 Midline Coccyx: Vitamin A; Vitamin C, Zinc Increase protein intake. Home Health: Wound #9 Midline Coccyx: Continue Home Health Visits - [place] Nurse may visit PRN to address patient s wound care needs. FACE TO FACE ENCOUNTER: MEDICARE and MEDICAID PATIENTS: I certify that this patient is under my care and that I had a face-to-face encounter that meets the physician face-to-face encounter requirements with this patient on this date. The encounter with the patient was in whole or in part for the following  MEDICAL CONDITION: (primary reason for [place]) MEDICAL NECESSITY: I certify, that based on my findings, NURSING services are a medically necessary home health service. HOME BOUND STATUS: I certify that my clinical findings support that this patient is homebound (i.e., Due to illness or injury, pt requires aid of supportive devices such as crutches, cane, wheelchairs, walkers, the use of special transportation or the assistance of another person to leave their place of residence. There is a normal inability to leave the home and doing so requires considerable and taxing effort. Other absences are for medical reasons / religious services and are infrequent or of short duration when for other reasons). If current dressing causes regression in wound condition, may D/C ordered dressing product/s and apply Normal Saline Moist Dressing daily until next [place] / Other MD appointment. Vass of regression in wound condition at [phone]. Please direct any NON-WOUND related issues/requests for orders to patient's Primary Care Physician Negative Pressure Wound Therapy: Wound #9 Midline Coccyx: Discontinue NPWT. The following medication(s) was prescribed: lidocaine topical 4 % cream 1 1 cream topical was prescribed at facility Electronic Signature(s) Signed: 09/28/2017 12:00:02 PM By: Lawanda Cousins Entered By: Lawanda Cousins on 09/28/2017 12:00:02 [name] ([id number]) -------------------------------------------------------------------------------- ROS/PFSH Details Patient Name: Bradley Loa A. Date of Service: 09/28/2017 10:30 AM Medical Record Number: [id number] Patient Account Number: [id number] Date of Birth/Sex: 02-01-1946 (72 y.o. M) Treating RN: Ahmed Prima Primary Care Provider: [name] Other Clinician: Referring Provider: [name] Treating Provider/Extender: Cathie Olden in Treatment: 1 Label Progress Note  Print Version as History and Physical for this encounter Information Obtained From Patient Wound History Do you currently have one or more open woundso Yes How many open wounds do you currently haveo 1 Approximately how long have you had your woundso 4 years How have you been treating your wound(s) until nowo NPWT Has your wound(s) ever healed and then re-openedo No Have you had any lab work done in the past montho No Have you tested positive for an antibiotic resistant organism (MRSA, VRE)o No Have you tested positive for osteomyelitis (bone infection)o Yes Date: 02/14/2017 Have you had any tests for circulation on your legso No Eyes Medical History: Negative for: Cataracts; Glaucoma; Optic Neuritis Hematologic/Lymphatic Medical History: Positive for: Anemia Negative for: Hemophilia; Human Immunodeficiency Virus; Lymphedema; Sickle Cell Disease Respiratory Medical History: Negative for: Aspiration; Asthma; Chronic Obstructive Pulmonary Disease (COPD); Pneumothorax; Sleep Apnea; Tuberculosis Cardiovascular Medical History: Positive for: Hypertension Gastrointestinal Medical  History: Negative for: Cirrhosis ; Colitis; Crohnos; Hepatitis A; Hepatitis B; Hepatitis C Endocrine Medical History: Negative for: Type I Diabetes; Type II Diabetes Genitourinary Bradley [name], Bradley [name] ([id number]) Medical History: Negative for: End Stage Renal Disease Past Medical History Notes: frequent urination Immunological Medical History: Negative for: Lupus Erythematosus; Raynaudos; Scleroderma Integumentary (Skin) Medical History: Positive for: History of pressure wounds Musculoskeletal Medical History: Positive for: Rheumatoid Arthritis Negative for: Gout; Osteoarthritis; Osteomyelitis Neurologic Medical History: Positive for: Paraplegia Negative for: Dementia; Neuropathy; Quadriplegia; Seizure Disorder Oncologic Medical History: Negative for: Received Chemotherapy; Received  Radiation Psychiatric Medical History: Negative for: Anorexia/bulimia; Confinement Anxiety Immunizations Pneumococcal Vaccine: Received Pneumococcal Vaccination: Yes Immunization Notes: up to date Implantable Devices Hospitalization / Surgery History Name of Hospital Purpose of Hospitalization/Surgery Date [place] Hemorroids 06/29/2013 Family and Social History Cancer: Yes - Mother; Diabetes: No; Heart Disease: Yes - Mother,Father; Hereditary Spherocytosis: No; Hypertension: No; Kidney Disease: No; Lung Disease: No; Seizures: Yes - Child; Stroke: No; Thyroid Problems: No; Tuberculosis: No; Former smoker; Marital Status - Single; Alcohol Use: Never; Drug Use: No History; Caffeine Use: Moderate - coffee; Financial Concerns: No; Food, Clothing or Shelter Needs: No; Support System Lacking: No; Transportation Concerns: No; Advanced Directives: No; Patient does not want information on Advanced Directives; Do not resuscitate: No; Living Will: No; Medical Power of Attorney: No Physician Affirmation I have reviewed and agree with the above information. DOYL, BITTING ([id number]) Electronic Signature(s) Signed: 09/28/2017 5:04:33 PM By: Lawanda Cousins Signed: 09/29/2017 4:29:16 PM By: Alric Quan Entered By: Lawanda Cousins on 09/28/2017 11:59:41 [name] ([id number]) -------------------------------------------------------------------------------- SuperBill Details Patient Name: Bradley Loa A. Date of Service: 09/28/2017 Medical Record Number: [id number] Patient Account Number: [id number] Date of Birth/Sex: [birth year] (72 y.o. M) Treating RN: Ahmed Prima Primary Care Provider: [name] Other Clinician: Referring Provider: [name] Treating Provider/Extender: Cathie Olden in Treatment: 1 Diagnosis Coding ICD-10 Codes Code Description L89.154 Pressure ulcer of sacral region, stage 4 G82.20 Paraplegia, unspecified M86.68 Other chronic osteomyelitis,  other site Facility Procedures CPT4 Code: [id number] Description: 11042 - DEB SUBQ TISSUE 20 SQ CM/< ICD-10 Diagnosis Description L89.154 Pressure ulcer of sacral region, stage 4 Modifier: Quantity: 1 CPT4 Code: [id number] Description: 11045 - DEB SUBQ TISS EA ADDL 20CM ICD-10 Diagnosis Description L89.154 Pressure ulcer of sacral region, stage 4 Modifier: Quantity: 6 Physician Procedures CPT4 Code: [id number] Description: 11042 - WC PHYS SUBQ TISS 20 SQ CM ICD-10 Diagnosis Description L89.154 Pressure ulcer of sacral region, stage 4 Modifier: Quantity: 1 CPT4 Code: [id number] Description: 44967 - WC PHYS SUBQ TISS EA ADDL 20 CM ICD-10 Diagnosis Description L89.154 Pressure ulcer of sacral region, stage 4 Modifier: Quantity: 6 Electronic Signature(s) Signed: 09/28/2017 12:00:15 PM By: Lawanda Cousins Entered By: Lawanda Cousins on 09/28/2017 12:00:15

## 2017-10-03 NOTE — Progress Notes (Signed)
Bradley, [name] ([id number]) Visit Report for 09/28/2017 Arrival Information Details Patient Name: Bradley [name], Bradley [name]. Date of Service: 09/28/2017 10:30 AM Medical Record Number: [id number] Patient Account Number: [id number] Date of Birth/Sex: [birth year] (72 y.o. M) Treating RN: Cornell Barman Primary Care Detron Carras: [name] Other Clinician: Referring Leaira Fullam: [name] Treating Marcelo Ickes/Extender: Cathie Olden in Treatment: 1 Visit Information History Since Last Visit Added or deleted any medications: No Patient Arrived: Wheel Chair Any new allergies or adverse reactions: No Arrival Time: 10:41 Had a fall or experienced change in No Accompanied By: self activities of daily living that may affect Transfer Assistance: None risk of falls: Patient Identification Verified: Yes Signs or symptoms of abuse/neglect since last visito No Secondary Verification Process Completed: Yes Hospitalized since last visit: No Implantable device outside of the clinic excluding No cellular tissue based products placed in the center since last visit: Has Dressing in Place as Prescribed: Yes Pain Present Now: No Electronic Signature(s) Signed: 09/28/2017 3:49:00 PM By: Gretta Cool, BSN, RN, CWS, Kim RN, BSN Entered By: Gretta Cool, BSN, RN, CWS, Kim on 09/28/2017 10:42:12 [name] ([id number]) -------------------------------------------------------------------------------- Encounter Discharge Information Details Patient Name: Bradley Loa A. Date of Service: 09/28/2017 10:30 AM Medical Record Number: [id number] Patient Account Number: [id number] Date of Birth/Sex: 1946/03/04 (72 y.o. M) Treating RN: Ahmed Prima Primary Care Elgene Coral: [name] Other Clinician: Referring Irem Stoneham: [name] Treating Tahji [place]/Extender: Cathie Olden in Treatment: 1 Encounter Discharge Information Items Schedule Follow-up Appointment: No Medication Reconciliation completed  and No provided to Patient/Care Deitra Craine: Provided on Clinical Summary of Care: 09/28/2017 Form Type Recipient Paper Patient JG Electronic Signature(s) Signed: 09/28/2017 11:54:53 AM By: Sharon Mt Entered By: Sharon Mt on 09/28/2017 11:54:53 [name] ([id number]) -------------------------------------------------------------------------------- Lower Extremity Assessment Details Patient Name: Bradley [name], Bradley A. Date of Service: 09/28/2017 10:30 AM Medical Record Number: [id number] Patient Account Number: [id number] Date of Birth/Sex: 11/21/1945 (72 y.o. M) Treating RN: Cornell Barman Primary Care Stevi Hollinshead: [name] Other Clinician: Referring Hardy Harcum: [name] Treating Korie Brabson/Extender: Cathie Olden in Treatment: 1 Electronic Signature(s) Signed: 09/28/2017 3:49:00 PM By: Gretta Cool, BSN, RN, CWS, Kim RN, BSN Entered By: Gretta Cool, BSN, RN, CWS, Kim on 09/28/2017 11:01:24 [name] ([id number]) -------------------------------------------------------------------------------- Multi Wound Chart Details Patient Name: Bradley [name], Bradley A. Date of Service: 09/28/2017 10:30 AM Medical Record Number: [id number] Patient Account Number: [id number] Date of Birth/Sex: 25-Oct-1945 (72 y.o. M) Treating RN: Ahmed Prima Primary Care Bryston Colocho: [name] Other Clinician: Referring Kalix Meinecke: [name] Treating Fergus Throne/Extender: Cathie Olden in Treatment: 1 Vital Signs Height(in): Pulse(bpm): 60 Weight(lbs): Blood Pressure(mmHg): 122/70 Body Mass Index(BMI): Temperature(F): 97.8 Respiratory Rate 16 (breaths/min): Photos: [9:No Photos] [N/A:N/A] Wound Location: [9:Coccyx - Midline] [N/A:N/A] Wounding Event: [9:Pressure Injury] [N/A:N/A] Primary Etiology: [9:Pressure Ulcer] [N/A:N/A] Comorbid History: [9:Anemia, Hypertension, History of pressure wounds, Rheumatoid Arthritis, Paraplegia] [N/A:N/A] Date Acquired: [9:06/06/2014] [N/A:N/A] Weeks  of Treatment: [9:1] [N/A:N/A] Wound Status: [9:Open] [N/A:N/A] Measurements L x W x D [9:10.2x13.5x4] [N/A:N/A] (cm) Area (cm) : [9:108.149] [N/A:N/A] Volume (cm) : [9:432.597] [N/A:N/A] % Reduction in Area: [9:-31.80%] [N/A:N/A] % Reduction in Volume: [9:-17.10%] [N/A:N/A] Starting Position 1 [9:1] (o'clock): Ending Position 1 [9:3] (o'clock): Maximum Distance 1 (cm): [9:0.5] Starting Position 2 [9:5] (o'clock): Ending Position 2 [9:7] (o'clock): Maximum Distance 2 (cm): [9:0.6] Undermining: [9:Yes] [N/A:N/A] Classification: [9:Category/Stage IV] [N/A:N/A] Exudate Amount: [9:Large] [N/A:N/A] Exudate Type: [9:Purulent] [N/A:N/A] Exudate Color: [9:yellow, brown, green] [N/A:N/A] Wound Margin: [9:Epibole] [N/A:N/A] Granulation Amount: [9:Large (67-100%)] [N/A:N/A] Granulation Quality: [9:Red, Hyper-granulation] [N/A:N/A] Necrotic Amount: [9:Small (1-33%)] [N/A:N/A] Exposed Structures: [N/A:N/A]  Fat Layer (Subcutaneous Tissue) Exposed: Yes Muscle: Yes Fascia: No Tendon: No Joint: No Bone: No Debridement: Debridement - Excisional N/A N/A Pre-procedure 11:27 N/A N/A Verification/Time Out Taken: Pain Control: Lidocaine 4% Topical Solution N/A N/A Tissue Debrided: Subcutaneous, Slough N/A N/A Level: Skin/Subcutaneous Tissue N/A N/A Debridement Area (sq cm): 137.7 N/A N/A Instrument: Curette N/A N/A Bleeding: Minimum N/A N/A Hemostasis Achieved: Pressure N/A N/A Procedural Pain: 0 N/A N/A Post Procedural Pain: 0 N/A N/A Debridement Treatment Procedure was tolerated well N/A N/A Response: Post Debridement 10.2x13.5x4.1 N/A N/A Measurements L x W x D (cm) Post Debridement Volume: 443.412 N/A N/A (cm) Post Debridement Stage: Category/Stage IV N/A N/A Periwound Skin Texture: Scarring: Yes N/A N/A Excoriation: No Induration: No Callus: No Crepitus: No Rash: No Periwound Skin Moisture: Maceration: Yes N/A N/A Dry/Scaly: No Periwound Skin Color: Atrophie  Blanche: No N/A N/A Cyanosis: No Ecchymosis: No Erythema: No Hemosiderin Staining: No Mottled: No Pallor: No Rubor: No Temperature: No Abnormality N/A N/A Tenderness on Palpation: No N/A N/A Wound Preparation: Ulcer Cleansing: N/A N/A Rinsed/Irrigated with Saline Topical Anesthetic Applied: Other: Lidocaine 4% Procedures Performed: Debridement N/A N/A Treatment Notes Electronic Signature(s) Signed: 09/28/2017 11:58:07 AM By: Gavin Pound ([id number]) Entered By: Lawanda Cousins on 09/28/2017 11:58:07 [name] ([id number]) -------------------------------------------------------------------------------- [place] Details Patient Name: HEVER, CASTILLEJA A. Date of Service: 09/28/2017 10:30 AM Medical Record Number: [id number] Patient Account Number: [id number] Date of Birth/Sex: [birth year] (72 y.o. M) Treating RN: Ahmed Prima Primary Care Zeah Germano: [name] Other Clinician: Referring Contina Strain: [name] Treating Koral Thaden/Extender: Cathie Olden in Treatment: 1 Active Inactive ` Abuse / Safety / Falls / Self Care Management Nursing Diagnoses: Potential for falls Goals: Patient will not experience any injury related to falls Date Initiated: 09/21/2017 Target Resolution Date: 01/13/2018 Goal Status: Active Interventions: Assess Activities of Daily Living upon admission and as needed Assess fall risk on admission and as needed Assess: immobility, friction, shearing, incontinence upon admission and as needed Assess impairment of mobility on admission and as needed per policy Assess personal safety and home safety (as indicated) on admission and as needed Assess self care needs on admission and as needed Notes: ` Nutrition Nursing Diagnoses: Imbalanced nutrition Potential for alteratiion in Nutrition/Potential for imbalanced nutrition Goals: Patient/caregiver agrees to and verbalizes understanding of need to  use nutritional supplements and/or vitamins as prescribed Date Initiated: 09/21/2017 Target Resolution Date: 01/13/2018 Goal Status: Active Interventions: Assess patient nutrition upon admission and as needed per policy Notes: ` Orientation to the Wound Care Program Nursing Diagnoses: Knowledge deficit related to the wound healing center program BRADYN, SOWARD ([id number]) Goals: Patient/caregiver will verbalize understanding of the [place] Date Initiated: 09/21/2017 Target Resolution Date: 10/14/2017 Goal Status: Active Interventions: Provide education on orientation to the wound center Notes: ` Pressure Nursing Diagnoses: Knowledge deficit related to causes and risk factors for pressure ulcer development Knowledge deficit related to management of pressures ulcers Potential for impaired tissue integrity related to pressure, friction, moisture, and shear Goals: Patient will remain free from development of additional pressure ulcers Date Initiated: 09/21/2017 Target Resolution Date: 01/13/2018 Goal Status: Active Patient/caregiver will verbalize risk factors for pressure ulcer development Date Initiated: 09/21/2017 Target Resolution Date: 02/10/2018 Goal Status: Active Patient/caregiver will verbalize understanding of pressure ulcer management Date Initiated: 09/21/2017 Target Resolution Date: 12/09/2017 Goal Status: Active Interventions: Assess: immobility, friction, shearing, incontinence upon admission and as needed Assess offloading mechanisms upon admission and as needed Assess potential for pressure ulcer  upon admission and as needed Provide education on pressure ulcers Notes: ` Wound/Skin Impairment Nursing Diagnoses: Impaired tissue integrity Knowledge deficit related to ulceration/compromised skin integrity Goals: Ulcer/skin breakdown will have a volume reduction of 80% by week 12 Date Initiated: 09/21/2017 Target Resolution Date: 04/14/2018 Goal  Status: Active Interventions: Assess patient/caregiver ability to perform ulcer/skin care regimen upon admission and as needed Assess ulceration(s) every visit JHAIR, WITHERINGTON ([id number]) Notes: Electronic Signature(s) Signed: 09/29/2017 4:29:16 PM By: Alric Quan Entered By: Alric Quan on 09/28/2017 11:26:59 [name] ([id number]) -------------------------------------------------------------------------------- Pain Assessment Details Patient Name: Bradley Loa A. Date of Service: 09/28/2017 10:30 AM Medical Record Number: [id number] Patient Account Number: [id number] Date of Birth/Sex: 03/30/1946 (72 y.o. M) Treating RN: Cornell Barman Primary Care Rahma Meller: [name] Other Clinician: Referring Nichol Ator: [name] Treating Navid Lenzen/Extender: Cathie Olden in Treatment: 1 Active Problems Location of Pain Severity and Description of Pain Patient Has Paino No Site Locations With Dressing Change: No Pain Management and Medication Current Pain Management: Electronic Signature(s) Signed: 09/28/2017 3:49:00 PM By: Gretta Cool, BSN, RN, CWS, Kim RN, BSN Entered By: Gretta Cool, BSN, RN, CWS, Kim on 09/28/2017 10:42:26 [name] ([id number]) -------------------------------------------------------------------------------- Wound Assessment Details Patient Name: Bradley [name], Bradley A. Date of Service: 09/28/2017 10:30 AM Medical Record Number: [id number] Patient Account Number: [id number] Date of Birth/Sex: 1945-08-09 (72 y.o. M) Treating RN: Cornell Barman Primary Care Timmy Cleverly: [name] Other Clinician: Referring Efrat Zuidema: [name] Treating Alfonzo Arca/Extender: Lawanda Cousins Weeks in Treatment: 1 Wound Status Wound Number: 9 Primary Pressure Ulcer Etiology: Wound Location: Coccyx - Midline Wound Open Wounding Event: Pressure Injury Status: Date Acquired: 06/06/2014 Comorbid Anemia, Hypertension, History of pressure Weeks Of Treatment:  1 History: wounds, Rheumatoid Arthritis, Paraplegia Clustered Wound: No Photos Photo Uploaded By: Alric Quan on 09/29/2017 16:14:42 Wound Measurements Length: (cm) 10.2 Width: (cm) 13.5 Depth: (cm) 4 Area: (cm) 108.149 Volume: (cm) 432.597 % Reduction in Area: -31.8% % Reduction in Volume: -17.1% Undermining: Yes Location 1 Starting Position (o'clock): 1 Ending Position (o'clock): 3 Maximum Distance: (cm) 0.5 Location 2 Starting Position (o'clock): 5 Ending Position (o'clock): 7 Maximum Distance: (cm) 0.6 Wound Description Classification: Category/Stage IV Foul Wound Margin: Epibole Slou Exudate Amount: Large Exudate Type: Purulent Exudate Color: yellow, brown, green Odor After Cleansing: No gh/Fibrino Yes Wound Bed Granulation Amount: Large (67-100%) Exposed Structure Granulation Quality: Red, Hyper-granulation Fascia Exposed: No Necrotic Amount: Small (1-33%) Fat Layer (Subcutaneous Tissue) Exposed: Yes Necrotic Quality: Adherent Slough Tendon Exposed: No Muscle Exposed: Yes KOJI, NIEHOFF A. ([id number]) Necrosis of Muscle: No Joint Exposed: No Bone Exposed: No Periwound Skin Texture Texture Color No Abnormalities Noted: No No Abnormalities Noted: No Callus: No Atrophie Blanche: No Crepitus: No Cyanosis: No Excoriation: No Ecchymosis: No Induration: No Erythema: No Rash: No Hemosiderin Staining: No Scarring: Yes Mottled: No Pallor: No Moisture Rubor: No No Abnormalities Noted: No Dry / Scaly: No Temperature / Pain Maceration: Yes Temperature: No Abnormality Wound Preparation Ulcer Cleansing: Rinsed/Irrigated with Saline Topical Anesthetic Applied: Other: Lidocaine 4%, Electronic Signature(s) Signed: 09/28/2017 3:49:00 PM By: Gretta Cool, BSN, RN, CWS, Kim RN, BSN Entered By: Gretta Cool, BSN, RN, CWS, Kim on 09/28/2017 10:58:31 [name] ([id number]) -------------------------------------------------------------------------------- Vitals  Details Patient Name: [name] Date of Service: 09/28/2017 10:30 AM Medical Record Number: [id number] Patient Account Number: [id number] Date of Birth/Sex: 1945-11-23 (72 y.o. M) Treating RN: Cornell Barman Primary Care Jowel Waltner: [name] Other Clinician: Referring Jerine Surles: [name] Treating Mayli Covington/Extender: Lawanda Cousins Weeks in Treatment: 1 Vital Signs Time Taken: 10:42 Temperature (F):  97.8 Unable to obtain height and weight: Medical Reason Pulse (bpm): 66 Respiratory Rate (breaths/min): 16 Blood Pressure (mmHg): 122/70 Reference Range: 80 - 120 mg / dl Electronic Signature(s) Signed: 09/28/2017 3:49:00 PM By: Gretta Cool, BSN, RN, CWS, Kim RN, BSN Entered By: Gretta Cool, BSN, RN, CWS, Kim on 09/28/2017 10:44:09

## 2017-10-04 DIAGNOSIS — D649 Anemia, unspecified: Secondary | ICD-10-CM | POA: Diagnosis not present

## 2017-10-04 DIAGNOSIS — G8220 Paraplegia, unspecified: Secondary | ICD-10-CM | POA: Diagnosis not present

## 2017-10-04 DIAGNOSIS — N401 Enlarged prostate with lower urinary tract symptoms: Secondary | ICD-10-CM | POA: Diagnosis not present

## 2017-10-04 DIAGNOSIS — M4638 Infection of intervertebral disc (pyogenic), sacral and sacrococcygeal region: Secondary | ICD-10-CM | POA: Diagnosis not present

## 2017-10-04 DIAGNOSIS — Z4800 Encounter for change or removal of nonsurgical wound dressing: Secondary | ICD-10-CM | POA: Diagnosis not present

## 2017-10-04 DIAGNOSIS — N319 Neuromuscular dysfunction of bladder, unspecified: Secondary | ICD-10-CM | POA: Diagnosis not present

## 2017-10-04 DIAGNOSIS — I10 Essential (primary) hypertension: Secondary | ICD-10-CM | POA: Diagnosis not present

## 2017-10-04 DIAGNOSIS — M069 Rheumatoid arthritis, unspecified: Secondary | ICD-10-CM | POA: Diagnosis not present

## 2017-10-04 DIAGNOSIS — L89154 Pressure ulcer of sacral region, stage 4: Secondary | ICD-10-CM | POA: Diagnosis not present

## 2017-10-06 DIAGNOSIS — Z4800 Encounter for change or removal of nonsurgical wound dressing: Secondary | ICD-10-CM | POA: Diagnosis not present

## 2017-10-06 DIAGNOSIS — I10 Essential (primary) hypertension: Secondary | ICD-10-CM | POA: Diagnosis not present

## 2017-10-06 DIAGNOSIS — G8220 Paraplegia, unspecified: Secondary | ICD-10-CM | POA: Diagnosis not present

## 2017-10-06 DIAGNOSIS — N401 Enlarged prostate with lower urinary tract symptoms: Secondary | ICD-10-CM | POA: Diagnosis not present

## 2017-10-06 DIAGNOSIS — M4638 Infection of intervertebral disc (pyogenic), sacral and sacrococcygeal region: Secondary | ICD-10-CM | POA: Diagnosis not present

## 2017-10-06 DIAGNOSIS — M069 Rheumatoid arthritis, unspecified: Secondary | ICD-10-CM | POA: Diagnosis not present

## 2017-10-06 DIAGNOSIS — L89154 Pressure ulcer of sacral region, stage 4: Secondary | ICD-10-CM | POA: Diagnosis not present

## 2017-10-06 DIAGNOSIS — N319 Neuromuscular dysfunction of bladder, unspecified: Secondary | ICD-10-CM | POA: Diagnosis not present

## 2017-10-06 DIAGNOSIS — D649 Anemia, unspecified: Secondary | ICD-10-CM | POA: Diagnosis not present

## 2017-10-09 DIAGNOSIS — D649 Anemia, unspecified: Secondary | ICD-10-CM | POA: Diagnosis not present

## 2017-10-09 DIAGNOSIS — N319 Neuromuscular dysfunction of bladder, unspecified: Secondary | ICD-10-CM | POA: Diagnosis not present

## 2017-10-09 DIAGNOSIS — L89154 Pressure ulcer of sacral region, stage 4: Secondary | ICD-10-CM | POA: Diagnosis not present

## 2017-10-09 DIAGNOSIS — G8220 Paraplegia, unspecified: Secondary | ICD-10-CM | POA: Diagnosis not present

## 2017-10-09 DIAGNOSIS — M4638 Infection of intervertebral disc (pyogenic), sacral and sacrococcygeal region: Secondary | ICD-10-CM | POA: Diagnosis not present

## 2017-10-09 DIAGNOSIS — N401 Enlarged prostate with lower urinary tract symptoms: Secondary | ICD-10-CM | POA: Diagnosis not present

## 2017-10-09 DIAGNOSIS — M069 Rheumatoid arthritis, unspecified: Secondary | ICD-10-CM | POA: Diagnosis not present

## 2017-10-09 DIAGNOSIS — I10 Essential (primary) hypertension: Secondary | ICD-10-CM | POA: Diagnosis not present

## 2017-10-09 DIAGNOSIS — Z4800 Encounter for change or removal of nonsurgical wound dressing: Secondary | ICD-10-CM | POA: Diagnosis not present

## 2017-10-10 DIAGNOSIS — L89323 Pressure ulcer of left buttock, stage 3: Secondary | ICD-10-CM | POA: Diagnosis not present

## 2017-10-10 DIAGNOSIS — G8220 Paraplegia, unspecified: Secondary | ICD-10-CM | POA: Diagnosis not present

## 2017-10-10 DIAGNOSIS — T8189XA Other complications of procedures, not elsewhere classified, initial encounter: Secondary | ICD-10-CM | POA: Diagnosis not present

## 2017-10-10 DIAGNOSIS — M6281 Muscle weakness (generalized): Secondary | ICD-10-CM | POA: Diagnosis not present

## 2017-10-10 DIAGNOSIS — L89154 Pressure ulcer of sacral region, stage 4: Secondary | ICD-10-CM | POA: Diagnosis not present

## 2017-10-10 DIAGNOSIS — I10 Essential (primary) hypertension: Secondary | ICD-10-CM | POA: Diagnosis not present

## 2017-10-10 DIAGNOSIS — L89159 Pressure ulcer of sacral region, unspecified stage: Secondary | ICD-10-CM | POA: Diagnosis not present

## 2017-10-10 DIAGNOSIS — L89313 Pressure ulcer of right buttock, stage 3: Secondary | ICD-10-CM | POA: Diagnosis not present

## 2017-10-11 DIAGNOSIS — L89154 Pressure ulcer of sacral region, stage 4: Secondary | ICD-10-CM | POA: Diagnosis not present

## 2017-10-11 DIAGNOSIS — M069 Rheumatoid arthritis, unspecified: Secondary | ICD-10-CM | POA: Diagnosis not present

## 2017-10-11 DIAGNOSIS — I10 Essential (primary) hypertension: Secondary | ICD-10-CM | POA: Diagnosis not present

## 2017-10-11 DIAGNOSIS — Z4800 Encounter for change or removal of nonsurgical wound dressing: Secondary | ICD-10-CM | POA: Diagnosis not present

## 2017-10-11 DIAGNOSIS — G8220 Paraplegia, unspecified: Secondary | ICD-10-CM | POA: Diagnosis not present

## 2017-10-11 DIAGNOSIS — M4638 Infection of intervertebral disc (pyogenic), sacral and sacrococcygeal region: Secondary | ICD-10-CM | POA: Diagnosis not present

## 2017-10-11 DIAGNOSIS — N319 Neuromuscular dysfunction of bladder, unspecified: Secondary | ICD-10-CM | POA: Diagnosis not present

## 2017-10-11 DIAGNOSIS — D649 Anemia, unspecified: Secondary | ICD-10-CM | POA: Diagnosis not present

## 2017-10-11 DIAGNOSIS — N401 Enlarged prostate with lower urinary tract symptoms: Secondary | ICD-10-CM | POA: Diagnosis not present

## 2017-10-12 ENCOUNTER — Encounter: Payer: Medicare HMO | Attending: Physician Assistant | Admitting: Physician Assistant

## 2017-10-12 DIAGNOSIS — G8220 Paraplegia, unspecified: Secondary | ICD-10-CM | POA: Diagnosis not present

## 2017-10-12 DIAGNOSIS — Z933 Colostomy status: Secondary | ICD-10-CM | POA: Diagnosis not present

## 2017-10-12 DIAGNOSIS — I10 Essential (primary) hypertension: Secondary | ICD-10-CM | POA: Diagnosis not present

## 2017-10-12 DIAGNOSIS — M8660 Other chronic osteomyelitis, unspecified site: Secondary | ICD-10-CM | POA: Insufficient documentation

## 2017-10-12 DIAGNOSIS — M069 Rheumatoid arthritis, unspecified: Secondary | ICD-10-CM | POA: Diagnosis not present

## 2017-10-12 DIAGNOSIS — Z87891 Personal history of nicotine dependence: Secondary | ICD-10-CM | POA: Diagnosis not present

## 2017-10-12 DIAGNOSIS — L89154 Pressure ulcer of sacral region, stage 4: Secondary | ICD-10-CM | POA: Insufficient documentation

## 2017-10-13 DIAGNOSIS — I10 Essential (primary) hypertension: Secondary | ICD-10-CM | POA: Diagnosis not present

## 2017-10-13 DIAGNOSIS — L89154 Pressure ulcer of sacral region, stage 4: Secondary | ICD-10-CM | POA: Diagnosis not present

## 2017-10-13 DIAGNOSIS — D649 Anemia, unspecified: Secondary | ICD-10-CM | POA: Diagnosis not present

## 2017-10-13 DIAGNOSIS — N401 Enlarged prostate with lower urinary tract symptoms: Secondary | ICD-10-CM | POA: Diagnosis not present

## 2017-10-13 DIAGNOSIS — M4638 Infection of intervertebral disc (pyogenic), sacral and sacrococcygeal region: Secondary | ICD-10-CM | POA: Diagnosis not present

## 2017-10-13 DIAGNOSIS — N319 Neuromuscular dysfunction of bladder, unspecified: Secondary | ICD-10-CM | POA: Diagnosis not present

## 2017-10-13 DIAGNOSIS — Z4800 Encounter for change or removal of nonsurgical wound dressing: Secondary | ICD-10-CM | POA: Diagnosis not present

## 2017-10-13 DIAGNOSIS — M069 Rheumatoid arthritis, unspecified: Secondary | ICD-10-CM | POA: Diagnosis not present

## 2017-10-13 DIAGNOSIS — G8220 Paraplegia, unspecified: Secondary | ICD-10-CM | POA: Diagnosis not present

## 2017-10-16 DIAGNOSIS — M069 Rheumatoid arthritis, unspecified: Secondary | ICD-10-CM | POA: Diagnosis not present

## 2017-10-16 DIAGNOSIS — L89154 Pressure ulcer of sacral region, stage 4: Secondary | ICD-10-CM | POA: Diagnosis not present

## 2017-10-16 DIAGNOSIS — N319 Neuromuscular dysfunction of bladder, unspecified: Secondary | ICD-10-CM | POA: Diagnosis not present

## 2017-10-16 DIAGNOSIS — G8220 Paraplegia, unspecified: Secondary | ICD-10-CM | POA: Diagnosis not present

## 2017-10-16 DIAGNOSIS — Z4800 Encounter for change or removal of nonsurgical wound dressing: Secondary | ICD-10-CM | POA: Diagnosis not present

## 2017-10-16 DIAGNOSIS — M4638 Infection of intervertebral disc (pyogenic), sacral and sacrococcygeal region: Secondary | ICD-10-CM | POA: Diagnosis not present

## 2017-10-16 DIAGNOSIS — I10 Essential (primary) hypertension: Secondary | ICD-10-CM | POA: Diagnosis not present

## 2017-10-16 DIAGNOSIS — D649 Anemia, unspecified: Secondary | ICD-10-CM | POA: Diagnosis not present

## 2017-10-16 DIAGNOSIS — N401 Enlarged prostate with lower urinary tract symptoms: Secondary | ICD-10-CM | POA: Diagnosis not present

## 2017-10-16 NOTE — Progress Notes (Signed)
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 Cardiovascular Patient has history of Hypertension YULIAN, GOSNEY A. ([id number]) Gastrointestinal Denies history of Cirrhosis , Colitis, Crohn s, Hepatitis A, Hepatitis B, Hepatitis C Endocrine Denies history of Type I Diabetes, Type II Diabetes Genitourinary Denies history of End Stage Renal Disease Immunological Denies history of Lupus Erythematosus, Raynaud s, Scleroderma Integumentary (Skin) Patient has history of  History of pressure wounds Musculoskeletal Patient has history of Rheumatoid Arthritis Denies history of Gout, Osteoarthritis, Osteomyelitis Neurologic Patient has history of Paraplegia Denies history of Dementia, Neuropathy, Quadriplegia, Seizure Disorder Oncologic Denies history of Received Chemotherapy, Received Radiation Psychiatric Denies history of Anorexia/bulimia, Confinement Anxiety Hospitalization/Surgery History - 06/29/2013, [facility], Hemorroids. Medical And Surgical History Notes Genitourinary frequent urination Review of Systems (ROS) Constitutional Symptoms (General Health) Denies complaints or symptoms of Fever, Chills. Respiratory The patient has no complaints or symptoms. Cardiovascular The patient has no complaints or symptoms. Psychiatric The patient has no complaints or symptoms. Objective Constitutional Well-nourished and well-hydrated in no acute distress. Vitals Time Taken: 11:07 AM, Temperature: 97.8 F, Pulse: 64 bpm, Respiratory Rate: 16 breaths/min, Blood Pressure: 132/73 mmHg. Respiratory normal breathing without difficulty. clear to auscultation bilaterally. Cardiovascular regular rate and rhythm with normal S1, S2. Parodi, Ilia A. ([id number]) Psychiatric this patient is able to make decisions and demonstrates good insight into disease process. Alert and Oriented x 3. pleasant and cooperative. General Notes: Patients one did not appear to show any signs of significant slough and there was no evidence nor need for sharp debridement today due to their being no necrotic slough noted patient was happy to hear this. Integumentary (Hair, Skin) Wound #9 status is Open. Original cause of wound was Pressure Injury. The wound is located on the Midline Coccyx. The wound measures 13.4cm length x 7.9cm width x 4cm depth; 83.142cm^2 area and 332.569cm^3 volume. There is muscle and Fat Layer (Subcutaneous Tissue) Exposed exposed. There is no tunneling or  undermining noted. There is a large amount of purulent drainage noted. The wound margin is epibole. There is large (67-100%) red, hyper - granulation within the wound bed. There is a small (1-33%) amount of necrotic tissue within the wound bed including Adherent Slough. The periwound skin appearance exhibited: Scarring, Maceration. The periwound skin appearance did not exhibit: Callus, Crepitus, Excoriation, Induration, Rash, Dry/Scaly, Atrophie Blanche, Cyanosis, Ecchymosis, Hemosiderin Staining, Mottled,  Pallor, Rubor, Erythema. Periwound temperature was noted as No Abnormality. Assessment Active Problems ICD-10 L89.154 - Pressure ulcer of sacral region, stage 4 G82.20 - Paraplegia, unspecified M86.68 - Other chronic osteomyelitis, other site Plan Wound Cleansing: Wound #9 Midline Coccyx: Clean wound with Normal Saline. Cleanse wound with mild soap and water Anesthetic (add to Medication List): Wound #9 Midline Coccyx: Topical Lidocaine 4% cream applied to wound bed prior to debridement (In Clinic Only). Skin Barriers/Peri-Wound Care: Wound #9 Midline Coccyx: Skin Prep Primary Wound Dressing: Wound #9 Midline Coccyx: Other: - Dakins Solution moistened gauze Secondary Dressing: Wound #9 Midline Coccyx: ABD pad Dry Gauze XtraSorb Other - medipore tape Dressing Change Frequency: ZAE, KIRTZ A. ([id number]) Wound #9 Midline Coccyx: Change dressing every day. Follow-up Appointments: Wound #9 Midline Coccyx: Return Appointment in 2 weeks. Off-Loading: Wound #9 Midline Coccyx: Turn and reposition every 2 hours Additional Orders / Instructions: Wound #9 Midline Coccyx: Vitamin A; Vitamin C, Zinc Increase protein intake. Home Health: Wound #9 Midline Coccyx: Continue Home Health Visits - [place] Nurse may visit PRN to address patient s wound care needs. FACE TO FACE ENCOUNTER: MEDICARE and MEDICAID PATIENTS: I certify that this patient is under my care and that  I had a face-to-face encounter that meets the physician face-to-face encounter requirements with this patient on this date. The encounter with the patient was in whole or in part for the following MEDICAL CONDITION: (primary reason for [place]) MEDICAL NECESSITY: I certify, that based on my findings, NURSING services are a medically necessary home health service. HOME BOUND STATUS: I certify that my clinical findings support that this patient is homebound (i.e., Due to illness or injury, pt requires aid of supportive devices such as crutches, cane, wheelchairs, walkers, the use of special transportation or the assistance of another person to leave their place of residence. There is a normal inability to leave the home and doing so requires considerable and taxing effort. Other absences are for medical reasons / religious services and are infrequent or of short duration when for other reasons). If current dressing causes regression in wound condition, may D/C ordered dressing product/s and apply Normal Saline Moist Dressing daily until next [place] / Other MD appointment. Rogers of regression in wound condition at [phone]. Please direct any NON-WOUND related issues/requests for orders to patient's Primary Care Physician Negative Pressure Wound Therapy: Wound #9 Midline Coccyx: Discontinue NPWT. The following medication(s) was prescribed: lidocaine topical 4 % cream 1 1 cream topical was prescribed at facility I'm gonna suggest that we continue with the Current wound care measures for the next week. He's in agreement the plan. We will subsequently see him for reevaluation following to see were things stand. Please see above for specific wound care orders. We will see patient for re-evaluation in 1 week(s) here in the clinic. If anything worsens or changes patient will contact our office for additional recommendations. Electronic Signature(s) Signed:  10/13/2017 2:32:11 PM By: Bradley Keeler PA-C Entered By: Bradley [name] on 10/12/2017 17:17:35 Bradley [name] ([id number]) -------------------------------------------------------------------------------- ROS/PFSH Details Patient Name: Bradley [name] Date of Service: 10/12/2017 10:30 AM Medical Record Number: [id number] Patient Account Number: [id number] Date of Birth/Sex: 1945/09/17 (72 y.o. M) Treating RN: Bradley [name] Primary Care Provider: Dion [name] Other Clinician: Referring Provider: Dion [name] Treating Provider/Extender: Bradley [name], Bradley [name] Weeks in Treatment: 3 Label Progress Note Print Version as History and Physical for this encounter Information Obtained From Patient Wound History Do  you currently have one or more open woundso Yes How many open wounds do you currently haveo 1 Approximately how long have you had your woundso 4 years How have you been treating your wound(s) until nowo NPWT Has your wound(s) ever healed and then re-openedo No Have you had any lab work done in the past montho No Have you tested positive for an antibiotic resistant organism (MRSA, VRE)o No Have you tested positive for osteomyelitis (bone infection)o Yes Date: 02/14/2017 Have you had any tests for circulation on your legso No Constitutional Symptoms (General Health) Complaints and Symptoms: Negative for: Fever; Chills Eyes Medical History: Negative for: Cataracts; Glaucoma; Optic Neuritis Hematologic/Lymphatic Medical History: Positive for: Anemia Negative for: Hemophilia; Human Immunodeficiency Virus; Lymphedema; Sickle Cell Disease Respiratory Complaints and Symptoms: No Complaints or Symptoms Medical History: Negative for: Aspiration; Asthma; Chronic Obstructive Pulmonary Disease (COPD); Pneumothorax; Sleep Apnea; Tuberculosis Cardiovascular Complaints and Symptoms: No Complaints or Symptoms Medical History: Positive for: Hypertension Gastrointestinal OSCAR, HANK ([id number]) Medical History: Negative for: Cirrhosis ; Colitis; Crohnos; Hepatitis A; Hepatitis B; Hepatitis C Endocrine Medical History: Negative for: Type I Diabetes; Type II Diabetes Genitourinary Medical History: Negative for: End Stage Renal Disease Past Medical History Notes: frequent urination Immunological Medical History: Negative for: Lupus Erythematosus; Raynaudos; Scleroderma Integumentary (Skin) Medical History: Positive for: History of pressure wounds Musculoskeletal Medical History: Positive for: Rheumatoid Arthritis Negative for: Gout; Osteoarthritis; Osteomyelitis Neurologic Medical History: Positive for: Paraplegia Negative for: Dementia; Neuropathy; Quadriplegia; Seizure Disorder Oncologic Medical History: Negative for: Received Chemotherapy; Received Radiation Psychiatric Complaints and Symptoms: No Complaints or Symptoms Medical History: Negative for: Anorexia/bulimia; Confinement Anxiety Immunizations Pneumococcal Vaccine: Received Pneumococcal Vaccination: Yes Immunization Notes: up to date Implantable Devices Hospitalization / Surgery History JABEZ, MOLNER ([id number]) Name of Hospital Purpose of Hospitalization/Surgery Date [place] Hemorroids 06/29/2013 Family and Social History Cancer: Yes - Mother; Diabetes: No; Heart Disease: Yes - Mother,Father; Hereditary Spherocytosis: No; Hypertension: No; Kidney Disease: No; Lung Disease: No; Seizures: Yes - Child; Stroke: No; Thyroid Problems: No; Tuberculosis: No; Former smoker; Marital Status - Single; Alcohol Use: Never; Drug Use: No History; Caffeine Use: Moderate - coffee; Financial Concerns: No; Food, Clothing or Shelter Needs: No; Support System Lacking: No; Transportation Concerns: No; Advanced Directives: No; Patient does not want information on Advanced Directives; Do not resuscitate: No; Living Will: No; Medical Power of Attorney: No Physician Affirmation I have reviewed and agree  with the above information. Electronic Signature(s) Signed: 10/13/2017 2:32:11 PM By: Bradley Keeler PA-C Signed: 10/13/2017 4:00:06 PM By: Alric Quan Entered By: Bradley [name] on 10/12/2017 17:16:57 Bradley [name] ([id number]) -------------------------------------------------------------------------------- SuperBill Details Patient Name: [name] Date of Service: 10/12/2017 Medical Record Number: [id number] Patient Account Number: [id number] Date of Birth/Sex: 1945-10-14 (72 y.o. M) Treating RN: Bradley [name] Primary Care Provider: Dion [name] Other Clinician: Referring Provider: Dion [name] Treating Provider/Extender: Bradley [name], Bradley [name] Weeks in Treatment: 3 Diagnosis Coding ICD-10 Codes Code Description L89.154 Pressure ulcer of sacral region, stage 4 G82.20 Paraplegia, unspecified M86.68 Other chronic osteomyelitis, other site Facility Procedures CPT4 Code: [id number] Description: 99214 - WOUND CARE VISIT-LEV 4 EST PT Modifier: Quantity: 1 Physician Procedures CPT4 Code: 2482500 Description: 37048 - WC PHYS LEVEL 3 - EST PT ICD-10 Diagnosis Description L89.154 Pressure ulcer of sacral region, stage 4 G82.20 Paraplegia, unspecified M86.68 Other chronic osteomyelitis, other site Modifier: Quantity: 1 Electronic Signature(s) Signed: 10/13/2017 2:32:11 PM By: Bradley Keeler PA-C Entered By: Bradley [name] on 10/12/2017 17:17:51

## 2017-10-16 NOTE — Progress Notes (Signed)
Bradley [name], Bradley [name] ([id number]) Visit Report for 10/12/2017 Arrival Information Details Patient Name: Bradley [name], Bradley [name]. Date of Service: 10/12/2017 10:30 AM Medical Record Number: [id number] Patient Account Number: [id number] Date of Birth/Sex: 1946-02-17 (72 y.o. M) Treating RN: Montey Hora Primary Care Moxie Kalil: [name] Other Clinician: Referring Justine Dines: [name] Treating Tenasia Aull/Extender: [name], HOYT Weeks in Treatment: 3 Visit Information History Since Last Visit Added or deleted any medications: No Patient Arrived: Wheel Chair Any new allergies or adverse reactions: No Arrival Time: 11:07 Had a fall or experienced change in No Accompanied By: self activities of daily living that may affect Transfer Assistance: Harrel Lemon Lift risk of falls: Patient Identification Verified: Yes Signs or symptoms of abuse/neglect since last visito No Secondary Verification Process Completed: Yes Hospitalized since last visit: No Implantable device outside of the clinic excluding No cellular tissue based products placed in the center since last visit: Has Dressing in Place as Prescribed: Yes Pain Present Now: No Electronic Signature(s) Signed: 10/12/2017 4:34:09 PM By: Montey Hora Entered By: Montey Hora on 10/12/2017 11:07:33 [name] ([id number]) -------------------------------------------------------------------------------- Clinic Level of Care Assessment Details Patient Name: Bradley Loa A. Date of Service: 10/12/2017 10:30 AM Medical Record Number: [id number] Patient Account Number: [id number] Date of Birth/Sex: 1946/01/22 (72 y.o. M) Treating RN: Ahmed Prima Primary Care Camaryn Lumbert: [name] Other Clinician: Referring Vinay Ertl: [name] Treating Jarred Purtee/Extender: [name], HOYT Weeks in Treatment: 3 Clinic Level of Care Assessment Items TOOL 4 Quantity Score X - Use when only an EandM is performed on FOLLOW-UP visit 1  0 ASSESSMENTS - Nursing Assessment / Reassessment X - Reassessment of Co-morbidities (includes updates in patient status) 1 10 X- 1 5 Reassessment of Adherence to Treatment Plan ASSESSMENTS - Wound and Skin Assessment / Reassessment X - Simple Wound Assessment / Reassessment - one wound 1 5 []  - 0 Complex Wound Assessment / Reassessment - multiple wounds []  - 0 Dermatologic / Skin Assessment (not related to wound area) ASSESSMENTS - Focused Assessment []  - Circumferential Edema Measurements - multi extremities 0 []  - 0 Nutritional Assessment / Counseling / Intervention []  - 0 Lower Extremity Assessment (monofilament, tuning fork, pulses) []  - 0 Peripheral Arterial Disease Assessment (using hand held doppler) ASSESSMENTS - Ostomy and/or Continence Assessment and Care []  - Incontinence Assessment and Management 0 []  - 0 Ostomy Care Assessment and Management (repouching, etc.) PROCESS - Coordination of Care []  - Simple Patient / Family Education for ongoing care 0 X- 1 20 Complex (extensive) Patient / Family Education for ongoing care X- 1 10 Staff obtains Programmer, systems, Records, Test Results / Process Orders X- 1 10 Staff telephones HHA, Nursing Homes / Clarify orders / etc []  - 0 Routine Transfer to another Facility (non-emergent condition) []  - 0 Routine Hospital Admission (non-emergent condition) []  - 0 New Admissions / Biomedical engineer / Ordering NPWT, Apligraf, etc. []  - 0 Emergency Hospital Admission (emergent condition) X- 1 10 Simple Discharge Coordination Bradley [name], Bradley A. ([id number]) []  - 0 Complex (extensive) Discharge Coordination PROCESS - Special Needs []  - Pediatric / Minor Patient Management 0 []  - 0 Isolation Patient Management []  - 0 Hearing / Language / Visual special needs []  - 0 Assessment of Community assistance (transportation, D/C planning, etc.) []  - 0 Additional assistance / Altered mentation []  - 0 Support Surface(s) Assessment (bed,  cushion, seat, etc.) INTERVENTIONS - Wound Cleansing / Measurement X - Simple Wound Cleansing - one wound 1 5 []  - 0 Complex Wound Cleansing - multiple wounds X- 1 5  Wound Imaging (photographs - any number of wounds) []  - 0 Wound Tracing (instead of photographs) X- 1 5 Simple Wound Measurement - one wound []  - 0 Complex Wound Measurement - multiple wounds INTERVENTIONS - Wound Dressings []  - Small Wound Dressing one or multiple wounds 0 X- 1 15 Medium Wound Dressing one or multiple wounds []  - 0 Large Wound Dressing one or multiple wounds X- 1 5 Application of Medications - topical []  - 0 Application of Medications - injection INTERVENTIONS - Miscellaneous []  - External ear exam 0 []  - 0 Specimen Collection (cultures, biopsies, blood, body fluids, etc.) []  - 0 Specimen(s) / Culture(s) sent or taken to Lab for analysis X- 1 10 Patient Transfer (multiple staff / Civil Service fast streamer / Similar devices) []  - 0 Simple Staple / Suture removal (25 or less) []  - 0 Complex Staple / Suture removal (26 or more) []  - 0 Hypo / Hyperglycemic Management (close monitor of Blood Glucose) []  - 0 Ankle / Brachial Index (ABI) - do not check if billed separately X- 1 5 Vital Signs Hedtke, Jalen A. ([id number]) Has the patient been seen at the hospital within the last three years: Yes Total Score: 120 Level Of Care: New/Established - Level 4 Electronic Signature(s) Signed: 10/13/2017 4:00:06 PM By: Alric Quan Entered By: Alric Quan on 10/12/2017 13:15:57 [name] ([id number]) -------------------------------------------------------------------------------- Encounter Discharge Information Details Patient Name: Bradley Loa A. Date of Service: 10/12/2017 10:30 AM Medical Record Number: [id number] Patient Account Number: [id number] Date of Birth/Sex: [birth year] (72 y.o. M) Treating RN: Roger Shelter Primary Care Joseangel Nettleton: [name] Other Clinician: Referring Sharlie Shreffler:  [name] Treating Jahrell Hamor/Extender: Sharalyn Ink in Treatment: 3 Encounter Discharge Information Items Discharge Condition: Stable Ambulatory Status: Wheelchair Discharge Destination: Home Transportation: Private Auto Schedule Follow-up Appointment: Yes Clinical Summary of Care: Electronic Signature(s) Signed: 10/12/2017 5:11:28 PM By: Roger Shelter Entered By: Roger Shelter on 10/12/2017 11:52:45 [name] ([id number]) -------------------------------------------------------------------------------- Lower Extremity Assessment Details Patient Name: Bradley Loa A. Date of Service: 10/12/2017 10:30 AM Medical Record Number: [id number] Patient Account Number: [id number] Date of Birth/Sex: 1946/05/30 (72 y.o. M) Treating RN: Montey Hora Primary Care Ashiah Karpowicz: [name] Other Clinician: Referring Luken Shadowens: [name] Treating Tanica Gaige/Extender: [name], HOYT Weeks in Treatment: 3 Electronic Signature(s) Signed: 10/12/2017 4:34:09 PM By: Montey Hora Entered By: Montey Hora on 10/12/2017 11:13:27 [name] ([id number]) -------------------------------------------------------------------------------- Multi Wound Chart Details Patient Name: Bradley Loa A. Date of Service: 10/12/2017 10:30 AM Medical Record Number: [id number] Patient Account Number: [id number] Date of Birth/Sex: 09-10-1945 (72 y.o. M) Treating RN: Ahmed Prima Primary Care Istvan Behar: [name] Other Clinician: Referring Raygan Skarda: [name] Treating Lateefa Crosby/Extender: [name], HOYT Weeks in Treatment: 3 Vital Signs Height(in): Pulse(bpm): 43 Weight(lbs): Blood Pressure(mmHg): 132/73 Body Mass Index(BMI): Temperature(F): 97.8 Respiratory Rate 16 (breaths/min): Photos: [9:No Photos] [N/A:N/A] Wound Location: [9:Coccyx - Midline] [N/A:N/A] Wounding Event: [9:Pressure Injury] [N/A:N/A] Primary Etiology: [9:Pressure Ulcer]  [N/A:N/A] Comorbid History: [9:Anemia, Hypertension, History of pressure wounds, Rheumatoid Arthritis, Paraplegia] [N/A:N/A] Date Acquired: [9:06/06/2014] [N/A:N/A] Weeks of Treatment: [9:3] [N/A:N/A] Wound Status: [9:Open] [N/A:N/A] Measurements L x W x D [9:13.4x7.9x4] [N/A:N/A] (cm) Area (cm) : [9:83.142] [N/A:N/A] Volume (cm) : [9:332.569] [N/A:N/A] % Reduction in Area: [9:-1.30%] [N/A:N/A] % Reduction in Volume: [9:9.90%] [N/A:N/A] Classification: [9:Category/Stage IV] [N/A:N/A] Exudate Amount: [9:Large] [N/A:N/A] Exudate Type: [9:Purulent] [N/A:N/A] Exudate Color: [9:yellow, brown, green] [N/A:N/A] Wound Margin: [9:Epibole] [N/A:N/A] Granulation Amount: [9:Large (67-100%)] [N/A:N/A] Granulation Quality: [9:Red, Hyper-granulation] [N/A:N/A] Necrotic Amount: [9:Small (1-33%)] [N/A:N/A] Exposed Structures: [9:Fat Layer (Subcutaneous Tissue)  Exposed: Yes Muscle: Yes Fascia: No Tendon: No Joint: No Bone: No] [N/A:N/A] Epithelialization: [9:None] [N/A:N/A] Periwound Skin Texture: [9:Scarring: Yes Excoriation: No Induration: No Callus: No] [N/A:N/A] Crepitus: No Rash: No Periwound Skin Moisture: Maceration: Yes N/A N/A Dry/Scaly: No Periwound Skin Color: Atrophie Blanche: No N/A N/A Cyanosis: No Ecchymosis: No Erythema: No Hemosiderin Staining: No Mottled: No Pallor: No Rubor: No Temperature: No Abnormality N/A N/A Tenderness on Palpation: No N/A N/A Wound Preparation: Ulcer Cleansing: N/A N/A Rinsed/Irrigated with Saline Topical Anesthetic Applied: Other: Lidocaine 4% Treatment Notes Electronic Signature(s) Signed: 10/13/2017 4:00:06 PM By: Alric Quan Entered By: Alric Quan on 10/12/2017 11:29:54 [name] ([id number]) -------------------------------------------------------------------------------- [place] Details Patient Name: Bradley [name], Bradley A. Date of Service: 10/12/2017 10:30 AM Medical Record Number: [id number] Patient  Account Number: [id number] Date of Birth/Sex: November 20, 1945 (72 y.o. M) Treating RN: Ahmed Prima Primary Care Usama Harkless: [name] Other Clinician: Referring Jakorey Mcconathy: [name] Treating Marsheila Alejo/Extender: [name], HOYT Weeks in Treatment: 3 Active Inactive ` Abuse / Safety / Falls / Self Care Management Nursing Diagnoses: Potential for falls Goals: Patient will not experience any injury related to falls Date Initiated: 09/21/2017 Target Resolution Date: 01/13/2018 Goal Status: Active Interventions: Assess Activities of Daily Living upon admission and as needed Assess fall risk on admission and as needed Assess: immobility, friction, shearing, incontinence upon admission and as needed Assess impairment of mobility on admission and as needed per policy Assess personal safety and home safety (as indicated) on admission and as needed Assess self care needs on admission and as needed Notes: ` Nutrition Nursing Diagnoses: Imbalanced nutrition Potential for alteratiion in Nutrition/Potential for imbalanced nutrition Goals: Patient/caregiver agrees to and verbalizes understanding of need to use nutritional supplements and/or vitamins as prescribed Date Initiated: 09/21/2017 Target Resolution Date: 01/13/2018 Goal Status: Active Interventions: Assess patient nutrition upon admission and as needed per policy Notes: ` Orientation to the Wound Care Program Nursing Diagnoses: Knowledge deficit related to the wound healing center program Bradley [name], Bradley [name] ([id number]) Goals: Patient/caregiver will verbalize understanding of the [place] Date Initiated: 09/21/2017 Target Resolution Date: 10/14/2017 Goal Status: Active Interventions: Provide education on orientation to the wound center Notes: ` Pressure Nursing Diagnoses: Knowledge deficit related to causes and risk factors for pressure ulcer development Knowledge deficit related to management of  pressures ulcers Potential for impaired tissue integrity related to pressure, friction, moisture, and shear Goals: Patient will remain free from development of additional pressure ulcers Date Initiated: 09/21/2017 Target Resolution Date: 01/13/2018 Goal Status: Active Patient/caregiver will verbalize risk factors for pressure ulcer development Date Initiated: 09/21/2017 Target Resolution Date: 02/10/2018 Goal Status: Active Patient/caregiver will verbalize understanding of pressure ulcer management Date Initiated: 09/21/2017 Target Resolution Date: 12/09/2017 Goal Status: Active Interventions: Assess: immobility, friction, shearing, incontinence upon admission and as needed Assess offloading mechanisms upon admission and as needed Assess potential for pressure ulcer upon admission and as needed Provide education on pressure ulcers Notes: ` Wound/Skin Impairment Nursing Diagnoses: Impaired tissue integrity Knowledge deficit related to ulceration/compromised skin integrity Goals: Ulcer/skin breakdown will have a volume reduction of 80% by week 12 Date Initiated: 09/21/2017 Target Resolution Date: 04/14/2018 Goal Status: Active Interventions: Assess patient/caregiver ability to perform ulcer/skin care regimen upon admission and as needed Assess ulceration(s) every visit Bradley [name], Bradley [name] ([id number]) Notes: Electronic Signature(s) Signed: 10/13/2017 4:00:06 PM By: Alric Quan Entered By: Alric Quan on 10/12/2017 11:29:42 [name] ([id number]) -------------------------------------------------------------------------------- Pain Assessment Details Patient Name: Bradley Loa A. Date of Service: 10/12/2017 10:30 AM  Medical Record Number: [id number] Patient Account Number: [id number] Date of Birth/Sex: 05/10/1946 (72 y.o. M) Treating RN: Montey Hora Primary Care Corita Allinson: [name] Other Clinician: Referring Alita Waldren: [name] Treating  Donyea Beverlin/Extender: [name], HOYT Weeks in Treatment: 3 Active Problems Location of Pain Severity and Description of Pain Patient Has Paino No Site Locations Pain Management and Medication Current Pain Management: Electronic Signature(s) Signed: 10/12/2017 4:34:09 PM By: Montey Hora Entered By: Montey Hora on 10/12/2017 11:07:39 [name] ([id number]) -------------------------------------------------------------------------------- Patient/Caregiver Education Details Patient Name: Bradley Loa A. Date of Service: 10/12/2017 10:30 AM Medical Record Number: [id number] Patient Account Number: [id number] Date of Birth/Gender: 1946/02/03 (72 y.o. M) Treating RN: Roger Shelter Primary Care Physician: [name] Other Clinician: Referring Physician: [name] Treating Physician/Extender: Sharalyn Ink in Treatment: 3 Education Assessment Education Provided To: Patient Education Topics Provided Wound Debridement: Handouts: Wound Debridement Methods: Explain/Verbal Responses: State content correctly Wound/Skin Impairment: Handouts: Caring for Your Ulcer Methods: Explain/Verbal Responses: State content correctly Electronic Signature(s) Signed: 10/12/2017 5:11:28 PM By: Roger Shelter Entered By: Roger Shelter on 10/12/2017 11:53:11 [name] ([id number]) -------------------------------------------------------------------------------- Wound Assessment Details Patient Name: Bradley Loa A. Date of Service: 10/12/2017 10:30 AM Medical Record Number: [id number] Patient Account Number: [id number] Date of Birth/Sex: 11-Aug-1945 (72 y.o. M) Treating RN: Montey Hora Primary Care Kato Wieczorek: [name] Other Clinician: Referring Madgeline Rayo: [name] Treating Kimela Malstrom/Extender: [name], HOYT Weeks in Treatment: 3 Wound Status Wound Number: 9 Primary Pressure Ulcer Etiology: Wound Location: Coccyx - Midline Wound  Open Wounding Event: Pressure Injury Status: Date Acquired: 06/06/2014 Comorbid Anemia, Hypertension, History of pressure Weeks Of Treatment: 3 History: wounds, Rheumatoid Arthritis, Paraplegia Clustered Wound: No Photos Photo Uploaded By: Gretta Cool, BSN, RN, CWS, Kim on 10/12/2017 16:10:42 Wound Measurements Length: (cm) 13.4 Width: (cm) 7.9 Depth: (cm) 4 Area: (cm) 83.142 Volume: (cm) 332.569 % Reduction in Area: -1.3% % Reduction in Volume: 9.9% Epithelialization: None Tunneling: No Undermining: No Wound Description Classification: Category/Stage IV Foul Odor Wound Margin: Epibole Slough/Fi Exudate Amount: Large Exudate Type: Purulent Exudate Color: yellow, brown, green After Cleansing: No brino Yes Wound Bed Granulation Amount: Large (67-100%) Exposed Structure Granulation Quality: Red, Hyper-granulation Fascia Exposed: No Necrotic Amount: Small (1-33%) Fat Layer (Subcutaneous Tissue) Exposed: Yes Necrotic Quality: Adherent Slough Tendon Exposed: No Muscle Exposed: Yes Necrosis of Muscle: No Joint Exposed: No Bone Exposed: No Periwound Skin Texture Goltz, Enes A. ([id number]) Texture Color No Abnormalities Noted: No No Abnormalities Noted: No Callus: No Atrophie Blanche: No Crepitus: No Cyanosis: No Excoriation: No Ecchymosis: No Induration: No Erythema: No Rash: No Hemosiderin Staining: No Scarring: Yes Mottled: No Pallor: No Moisture Rubor: No No Abnormalities Noted: No Dry / Scaly: No Temperature / Pain Maceration: Yes Temperature: No Abnormality Wound Preparation Ulcer Cleansing: Rinsed/Irrigated with Saline Topical Anesthetic Applied: Other: Lidocaine 4%, Treatment Notes Wound #9 (Midline Coccyx) 1. Cleansed with: Clean wound with Normal Saline 2. Anesthetic Topical Lidocaine 4% cream to wound bed prior to debridement 4. Dressing Applied: Other dressing (specify in notes) 5. Secondary Dressing Applied ABD Pad Notes dakins soaked  gauze packing with dry gauze on top cover with abd and xtrasorb and tape Electronic Signature(s) Signed: 10/12/2017 4:34:09 PM By: Montey Hora Entered By: Montey Hora on 10/12/2017 11:13:14 [name] ([id number]) -------------------------------------------------------------------------------- [place] Details Patient Name: Bradley Loa A. Date of Service: 10/12/2017 10:30 AM Medical Record Number: [id number] Patient Account Number: [id number] Date of Birth/Sex: [birth year] (72 y.o. M) Treating RN: Montey Hora Primary Care [place] Marchiano: [name] Other Clinician: Referring  Sarabella Caprio: [name] Treating Jazzlene Huot/Extender: [name], HOYT Weeks in Treatment: 3 Vital Signs Time Taken: 11:07 Temperature (F): 97.8 Pulse (bpm): 64 Respiratory Rate (breaths/min): 16 Blood Pressure (mmHg): 132/73 Reference Range: 80 - 120 mg / dl Electronic Signature(s) Signed: 10/12/2017 4:34:09 PM By: Montey Hora Entered By: Montey Hora on 10/12/2017 11:08:09

## 2017-10-18 DIAGNOSIS — N401 Enlarged prostate with lower urinary tract symptoms: Secondary | ICD-10-CM | POA: Diagnosis not present

## 2017-10-18 DIAGNOSIS — I10 Essential (primary) hypertension: Secondary | ICD-10-CM | POA: Diagnosis not present

## 2017-10-18 DIAGNOSIS — M069 Rheumatoid arthritis, unspecified: Secondary | ICD-10-CM | POA: Diagnosis not present

## 2017-10-18 DIAGNOSIS — M4638 Infection of intervertebral disc (pyogenic), sacral and sacrococcygeal region: Secondary | ICD-10-CM | POA: Diagnosis not present

## 2017-10-18 DIAGNOSIS — Z4800 Encounter for change or removal of nonsurgical wound dressing: Secondary | ICD-10-CM | POA: Diagnosis not present

## 2017-10-18 DIAGNOSIS — D649 Anemia, unspecified: Secondary | ICD-10-CM | POA: Diagnosis not present

## 2017-10-18 DIAGNOSIS — N319 Neuromuscular dysfunction of bladder, unspecified: Secondary | ICD-10-CM | POA: Diagnosis not present

## 2017-10-18 DIAGNOSIS — L89154 Pressure ulcer of sacral region, stage 4: Secondary | ICD-10-CM | POA: Diagnosis not present

## 2017-10-18 DIAGNOSIS — G8220 Paraplegia, unspecified: Secondary | ICD-10-CM | POA: Diagnosis not present

## 2017-10-20 DIAGNOSIS — N319 Neuromuscular dysfunction of bladder, unspecified: Secondary | ICD-10-CM | POA: Diagnosis not present

## 2017-10-20 DIAGNOSIS — G8220 Paraplegia, unspecified: Secondary | ICD-10-CM | POA: Diagnosis not present

## 2017-10-20 DIAGNOSIS — M4638 Infection of intervertebral disc (pyogenic), sacral and sacrococcygeal region: Secondary | ICD-10-CM | POA: Diagnosis not present

## 2017-10-20 DIAGNOSIS — Z4800 Encounter for change or removal of nonsurgical wound dressing: Secondary | ICD-10-CM | POA: Diagnosis not present

## 2017-10-20 DIAGNOSIS — L89154 Pressure ulcer of sacral region, stage 4: Secondary | ICD-10-CM | POA: Diagnosis not present

## 2017-10-20 DIAGNOSIS — D649 Anemia, unspecified: Secondary | ICD-10-CM | POA: Diagnosis not present

## 2017-10-20 DIAGNOSIS — M069 Rheumatoid arthritis, unspecified: Secondary | ICD-10-CM | POA: Diagnosis not present

## 2017-10-20 DIAGNOSIS — N401 Enlarged prostate with lower urinary tract symptoms: Secondary | ICD-10-CM | POA: Diagnosis not present

## 2017-10-20 DIAGNOSIS — I10 Essential (primary) hypertension: Secondary | ICD-10-CM | POA: Diagnosis not present

## 2017-10-23 DIAGNOSIS — D649 Anemia, unspecified: Secondary | ICD-10-CM | POA: Diagnosis not present

## 2017-10-23 DIAGNOSIS — N401 Enlarged prostate with lower urinary tract symptoms: Secondary | ICD-10-CM | POA: Diagnosis not present

## 2017-10-23 DIAGNOSIS — L89154 Pressure ulcer of sacral region, stage 4: Secondary | ICD-10-CM | POA: Diagnosis not present

## 2017-10-23 DIAGNOSIS — G8220 Paraplegia, unspecified: Secondary | ICD-10-CM | POA: Diagnosis not present

## 2017-10-23 DIAGNOSIS — I10 Essential (primary) hypertension: Secondary | ICD-10-CM | POA: Diagnosis not present

## 2017-10-23 DIAGNOSIS — Z4800 Encounter for change or removal of nonsurgical wound dressing: Secondary | ICD-10-CM | POA: Diagnosis not present

## 2017-10-23 DIAGNOSIS — N319 Neuromuscular dysfunction of bladder, unspecified: Secondary | ICD-10-CM | POA: Diagnosis not present

## 2017-10-23 DIAGNOSIS — M069 Rheumatoid arthritis, unspecified: Secondary | ICD-10-CM | POA: Diagnosis not present

## 2017-10-23 DIAGNOSIS — M4638 Infection of intervertebral disc (pyogenic), sacral and sacrococcygeal region: Secondary | ICD-10-CM | POA: Diagnosis not present

## 2017-10-25 DIAGNOSIS — G8220 Paraplegia, unspecified: Secondary | ICD-10-CM | POA: Diagnosis not present

## 2017-10-25 DIAGNOSIS — N401 Enlarged prostate with lower urinary tract symptoms: Secondary | ICD-10-CM | POA: Diagnosis not present

## 2017-10-25 DIAGNOSIS — Z4800 Encounter for change or removal of nonsurgical wound dressing: Secondary | ICD-10-CM | POA: Diagnosis not present

## 2017-10-25 DIAGNOSIS — M069 Rheumatoid arthritis, unspecified: Secondary | ICD-10-CM | POA: Diagnosis not present

## 2017-10-25 DIAGNOSIS — I10 Essential (primary) hypertension: Secondary | ICD-10-CM | POA: Diagnosis not present

## 2017-10-25 DIAGNOSIS — M4638 Infection of intervertebral disc (pyogenic), sacral and sacrococcygeal region: Secondary | ICD-10-CM | POA: Diagnosis not present

## 2017-10-25 DIAGNOSIS — N319 Neuromuscular dysfunction of bladder, unspecified: Secondary | ICD-10-CM | POA: Diagnosis not present

## 2017-10-25 DIAGNOSIS — D649 Anemia, unspecified: Secondary | ICD-10-CM | POA: Diagnosis not present

## 2017-10-25 DIAGNOSIS — L89154 Pressure ulcer of sacral region, stage 4: Secondary | ICD-10-CM | POA: Diagnosis not present

## 2017-10-26 ENCOUNTER — Encounter: Payer: Medicare HMO | Admitting: Nurse Practitioner

## 2017-10-26 DIAGNOSIS — G8220 Paraplegia, unspecified: Secondary | ICD-10-CM | POA: Diagnosis not present

## 2017-10-26 DIAGNOSIS — M069 Rheumatoid arthritis, unspecified: Secondary | ICD-10-CM | POA: Diagnosis not present

## 2017-10-26 DIAGNOSIS — M8660 Other chronic osteomyelitis, unspecified site: Secondary | ICD-10-CM | POA: Diagnosis not present

## 2017-10-26 DIAGNOSIS — L89154 Pressure ulcer of sacral region, stage 4: Secondary | ICD-10-CM | POA: Diagnosis not present

## 2017-10-26 DIAGNOSIS — Z933 Colostomy status: Secondary | ICD-10-CM | POA: Diagnosis not present

## 2017-10-26 DIAGNOSIS — Z87891 Personal history of nicotine dependence: Secondary | ICD-10-CM | POA: Diagnosis not present

## 2017-10-26 DIAGNOSIS — I10 Essential (primary) hypertension: Secondary | ICD-10-CM | POA: Diagnosis not present

## 2017-10-27 DIAGNOSIS — M069 Rheumatoid arthritis, unspecified: Secondary | ICD-10-CM | POA: Diagnosis not present

## 2017-10-27 DIAGNOSIS — N401 Enlarged prostate with lower urinary tract symptoms: Secondary | ICD-10-CM | POA: Diagnosis not present

## 2017-10-27 DIAGNOSIS — I10 Essential (primary) hypertension: Secondary | ICD-10-CM | POA: Diagnosis not present

## 2017-10-27 DIAGNOSIS — L89154 Pressure ulcer of sacral region, stage 4: Secondary | ICD-10-CM | POA: Diagnosis not present

## 2017-10-27 DIAGNOSIS — D649 Anemia, unspecified: Secondary | ICD-10-CM | POA: Diagnosis not present

## 2017-10-27 DIAGNOSIS — N319 Neuromuscular dysfunction of bladder, unspecified: Secondary | ICD-10-CM | POA: Diagnosis not present

## 2017-10-27 DIAGNOSIS — M4638 Infection of intervertebral disc (pyogenic), sacral and sacrococcygeal region: Secondary | ICD-10-CM | POA: Diagnosis not present

## 2017-10-27 DIAGNOSIS — G8220 Paraplegia, unspecified: Secondary | ICD-10-CM | POA: Diagnosis not present

## 2017-10-27 DIAGNOSIS — Z4800 Encounter for change or removal of nonsurgical wound dressing: Secondary | ICD-10-CM | POA: Diagnosis not present

## 2017-10-27 NOTE — Progress Notes (Addendum)
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 Anorexia/bulimia, Confinement Anxiety Hospitalization/Surgery History - 06/29/2013, [facility], Hemorroids. Medical And Surgical History Notes Genitourinary frequent  urination Objective Constitutional Vitals Time Taken: 10:21 AM, Temperature: 98.3 F, Pulse: 61 bpm, Respiratory Rate: 16 breaths/min, Blood Pressure: 142/63 mmHg. Integumentary (Hair, Skin) Wound #9 status is Open. Original cause of wound was Pressure Injury. The wound is located on the Midline Coccyx. The wound measures 12cm length x 12cm width x 0.8cm depth; 113.097cm^2 area and 90.478cm^3 volume. There is muscle and Fat Layer (Subcutaneous Tissue) Exposed exposed. There is no undermining noted, however, there is tunneling at 1:00 with a maximum distance of 4.2cm. There is a large amount of serosanguineous drainage noted. The wound margin is epibole. There is large (67-100%) red, hyper - granulation within the wound bed. There is a small (1-33%) amount of necrotic tissue within the wound bed including Adherent Slough. The periwound skin appearance exhibited: Scarring, Maceration, Hemosiderin Staining. The periwound skin appearance did not exhibit: Callus, Crepitus, Excoriation, Induration, Rash, Dry/Scaly, Atrophie Blanche, Cyanosis, Ecchymosis, Mottled, Pallor, Rubor, Erythema. Periwound temperature was noted as No Abnormality. ARCHIT, LEGER ([id number]) Assessment Active Problems ICD-10 L89.154 - Pressure ulcer of sacral region, stage 4 G82.20 - Paraplegia, unspecified M86.68 - Other chronic osteomyelitis, other site Procedures Wound #9 Pre-procedure diagnosis of Wound #9 is a Pressure Ulcer located on the Midline Coccyx . There was a Excisional Skin/Subcutaneous Tissue Debridement with a total area of 144 sq cm performed by Lawanda Cousins, NP. With the following instrument(s): Curette to remove Viable tissue/material. Material removed includes Callus, Subcutaneous Tissue, Skin: Dermis, and Biofilm after achieving pain control using Other (lidocaine 4%). No specimens were taken. A time out was conducted at 10:42, prior to the start of the procedure. A Moderate amount of bleeding  was controlled with Silver Nitrate. The procedure was tolerated well with a pain level of 0 throughout and a pain level of 0 following the procedure. Patient s Level of Consciousness post procedure was recorded as Awake and Alert and post-procedure vitals were taken including Temperature: 98.3 F, Pulse: 61 bpm, Respiratory Rate: 16 breaths/min, Blood Pressure: (142)/(63) mmHg. Post Debridement Measurements: 12cm length  x 12cm width x 0.9cm depth; 101.788cm^3 volume. Post debridement Stage noted as Category/Stage IV. Character of Wound/Ulcer Post Debridement is stable. Post procedure Diagnosis Wound #9: Same as Pre-Procedure -rolled skin edges debrided to allow for closure Plan Wound Cleansing: Wound #9 Midline Coccyx: Clean wound with Normal Saline. Cleanse wound with mild soap and water Anesthetic (add to Medication List): Wound #9 Midline Coccyx: Topical Lidocaine 4% cream applied to wound bed prior to debridement (In Clinic Only). Skin Barriers/Peri-Wound Care: Wound #9 Midline Coccyx: Skin Prep Primary Wound Dressing: Wound #9 Midline Coccyx: Other: - Dakins Solution moistened gauze Secondary Dressing: JELAN, BATTERTON ([id number]) Wound #9 Midline Coccyx: ABD pad Dry Gauze XtraSorb Other - medipore tape Dressing Change Frequency: Wound #9 Midline Coccyx: Change dressing every day. Follow-up Appointments: Wound #9 Midline Coccyx: Return Appointment in 2 weeks. Off-Loading: Wound #9 Midline Coccyx: Turn and reposition every 2 hours Additional Orders / Instructions: Wound #9 Midline Coccyx: Vitamin A; Vitamin C, Zinc Increase protein intake. Home Health: Wound #9 Midline Coccyx: Continue Home Health Visits - [facility] Nurse may visit PRN to address patient s wound care needs. FACE TO FACE ENCOUNTER: MEDICARE and MEDICAID PATIENTS: I certify that this patient is under my care and that I had a face-to-face encounter that meets the physician face-to-face encounter  requirements with this patient on this date. The encounter with the patient was in whole or in part for the following MEDICAL CONDITION: (primary reason for [place]) MEDICAL NECESSITY: I certify, that based on my findings, NURSING services are a medically necessary home health service. HOME BOUND STATUS: I certify that my clinical findings support that this patient is homebound (i.e., Due to illness or injury, pt requires aid of supportive devices such as crutches, cane, wheelchairs, walkers, the use of special transportation or the assistance of another person to leave their place of residence. There is a normal inability to leave the home and doing so requires considerable and taxing effort. Other absences are for medical reasons / religious services and are infrequent or of short duration when for other reasons). If current dressing causes regression in wound condition, may D/C ordered dressing product/s and apply Normal Saline Moist Dressing daily until next [place] / Other MD appointment. [place] of regression in wound condition at [phone]. Please direct any NON-WOUND related issues/requests for orders to patient's Primary Care Physician Electronic Signature(s) Signed: 10/26/2017 12:06:40 PM By: Lawanda Cousins Entered By: Lawanda Cousins on 10/26/2017 12:06:40 [name] ([id number]) -------------------------------------------------------------------------------- ROS/PFSH Details Patient Name: Bradley Loa A. Date of Service: 10/26/2017 10:00 AM Medical Record Number: [id number] Patient Account Number: [id number] Date of Birth/Sex: [birth year] (72 y.o. M) Treating RN: Roger Shelter Primary Care Provider: [name] Other Clinician: Referring Provider: [name] Treating Provider/Extender: Cathie Olden in Treatment: 5 Label Progress Note Print Version as History and Physical for this encounter Information Obtained  From Patient Wound History Do you currently have one or more open woundso Yes How many open wounds do you currently haveo 1 Approximately how long have you had your woundso 4 years How have you been treating your wound(s) until nowo NPWT Has your wound(s) ever healed and then re-openedo No Have you had any lab work done in the past montho No Have you tested positive for an antibiotic resistant organism (MRSA, VRE)o No Have you tested positive for osteomyelitis (bone infection)o Yes Date: 02/14/2017 Have you had any tests for circulation on your legso No Eyes Medical History:  Negative for: Cataracts; Glaucoma; Optic Neuritis Hematologic/Lymphatic Medical History: Positive for: Anemia Negative for: Hemophilia; Human Immunodeficiency Virus; Lymphedema; Sickle Cell Disease Respiratory Medical History: Negative for: Aspiration; Asthma; Chronic Obstructive Pulmonary Disease (COPD); Pneumothorax; Sleep Apnea; Tuberculosis Cardiovascular Medical History: Positive for: Hypertension Gastrointestinal Medical History: Negative for: Cirrhosis ; Colitis; Crohnos; Hepatitis A; Hepatitis B; Hepatitis C Endocrine Medical History: Negative for: Type I Diabetes; Type II Diabetes Genitourinary KOLESON, REIFSTECK ([id number]) Medical History: Negative for: End Stage Renal Disease Past Medical History Notes: frequent urination Immunological Medical History: Negative for: Lupus Erythematosus; Raynaudos; Scleroderma Integumentary (Skin) Medical History: Positive for: History of pressure wounds Musculoskeletal Medical History: Positive for: Rheumatoid Arthritis Negative for: Gout; Osteoarthritis; Osteomyelitis Neurologic Medical History: Positive for: Paraplegia Negative for: Dementia; Neuropathy; Quadriplegia; Seizure Disorder Oncologic Medical History: Negative for: Received Chemotherapy; Received Radiation Psychiatric Medical History: Negative for: Anorexia/bulimia; Confinement  Anxiety Immunizations Pneumococcal Vaccine: Received Pneumococcal Vaccination: Yes Immunization Notes: up to date Implantable Devices Hospitalization / Surgery History Name of Hospital Purpose of Hospitalization/Surgery Date [facility] Hemorroids 06/29/2013 Family and Social History Cancer: Yes - Mother; Diabetes: No; Heart Disease: Yes - Mother,Father; Hereditary Spherocytosis: No; Hypertension: No; Kidney Disease: No; Lung Disease: No; Seizures: Yes - Child; Stroke: No; Thyroid Problems: No; Tuberculosis: No; Former smoker; Marital Status - Single; Alcohol Use: Never; Drug Use: No History; Caffeine Use: Moderate - coffee; Financial Concerns: No; Food, Clothing or Shelter Needs: No; Support System Lacking: No; Transportation Concerns: No; Advanced Directives: No; Patient does not want information on Advanced Directives; Do not resuscitate: No; Living Will: No; Medical Power of Attorney: No Physician Affirmation I have reviewed and agree with the above information. ADEDAMOLA, SETO ([id number]) Electronic Signature(s) Signed: 10/26/2017 12:15:14 PM By: Lawanda Cousins Signed: 10/26/2017 4:33:19 PM By: Roger Shelter Entered By: Lawanda Cousins on 10/26/2017 12:05:53 [name] ([id number]) -------------------------------------------------------------------------------- SuperBill Details Patient Name: Bradley Loa A. Date of Service: 10/26/2017 Medical Record Number: [id number] Patient Account Number: [id number] Date of Birth/Sex: 1946-03-17 (72 y.o. M) Treating RN: Roger Shelter Primary Care Provider: [name] Other Clinician: Referring Provider: [name] Treating Provider/Extender: Cathie Olden in Treatment: 5 Diagnosis Coding ICD-10 Codes Code Description L89.154 Pressure ulcer of sacral region, stage 4 G82.20 Paraplegia, unspecified M86.68 Other chronic osteomyelitis, other site Facility Procedures CPT4 Code: [id number] Description: 11042 - DEB SUBQ  TISSUE 20 SQ CM/< ICD-10 Diagnosis Description L89.154 Pressure ulcer of sacral region, stage 4 Modifier: Quantity: 1 CPT4 Code: [id number] Description: 11045 - DEB SUBQ TISS EA ADDL 20CM ICD-10 Diagnosis Description L89.154 Pressure ulcer of sacral region, stage 4 Modifier: Quantity: 7 Physician Procedures CPT4 Code: [id number] Description: 11042 - WC PHYS SUBQ TISS 20 SQ CM ICD-10 Diagnosis Description L89.154 Pressure ulcer of sacral region, stage 4 Modifier: Quantity: 1 CPT4 Code: [id number] Description: 62831 - WC PHYS SUBQ TISS EA ADDL 20 CM ICD-10 Diagnosis Description L89.154 Pressure ulcer of sacral region, stage 4 Modifier: Quantity: 7 Electronic Signature(s) Signed: 10/26/2017 12:06:52 PM By: Lawanda Cousins Entered By: Lawanda Cousins on 10/26/2017 12:06:52

## 2017-10-28 NOTE — Progress Notes (Signed)
DOVBER, ERNEST ([id number]) Visit Report for 10/26/2017 Arrival Information Details Patient Name: Bradley [name], Bradley [name]. Date of Service: 10/26/2017 10:00 AM Medical Record Number: [id number] Patient Account Number: [id number] Date of Birth/Sex: [birth year] (72 y.o. M) Treating RN: Cornell Barman Primary Care Cathalina Barcia: [name] Other Clinician: Referring Quention Mcneill: [name] Treating Lanny Lipkin/Extender: Cathie Olden in Treatment: 5 Visit Information History Since Last Visit All ordered tests and consults were completed: No Patient Arrived: Wheel Chair Added or deleted any medications: No Arrival Time: 10:21 Any new allergies or adverse reactions: No Accompanied By: self Had a fall or experienced change in No Transfer Assistance: None activities of daily living that may affect Patient Identification Verified: Yes risk of falls: Secondary Verification Process Completed: Yes Signs or symptoms of abuse/neglect since last visito No Hospitalized since last visit: No Implantable device outside of the clinic excluding No cellular tissue based products placed in the center since last visit: Pain Present Now: No Electronic Signature(s) Signed: 10/26/2017 5:02:07 PM By: Gretta Cool, BSN, RN, CWS, Kim RN, BSN Entered By: Gretta Cool, BSN, RN, CWS, Kim on 10/26/2017 10:21:40 Bradley [name] ([id number]) -------------------------------------------------------------------------------- Encounter Discharge Information Details Patient Name: Bradley Loa A. Date of Service: 10/26/2017 10:00 AM Medical Record Number: [id number] Patient Account Number: [id number] Date of Birth/Sex: 21-Jan-1946 (72 y.o. M) Treating RN: Cornell Barman Primary Care Jennife Zaucha: [name] Other Clinician: Referring Ofilia Rayon: [name] Treating Vernie Vinciguerra/Extender: Cathie Olden in Treatment: 5 Encounter Discharge Information Items Discharge Condition: Stable Ambulatory Status: Wheelchair Discharge  Destination: Home Transportation: Private Auto Accompanied By: self Schedule Follow-up Appointment: Yes Clinical Summary of Care: Electronic Signature(s) Signed: 10/26/2017 4:42:42 PM By: Gretta Cool, BSN, RN, CWS, Kim RN, BSN Entered By: Gretta Cool, BSN, RN, CWS, Kim on 10/26/2017 16:42:41 Bradley [name] ([id number]) -------------------------------------------------------------------------------- Lower Extremity Assessment Details Patient Name: Bradley [name], Bradley A. Date of Service: 10/26/2017 10:00 AM Medical Record Number: [id number] Patient Account Number: [id number] Date of Birth/Sex: 09-21-1945 (72 y.o. M) Treating RN: Cornell Barman Primary Care Liz Pinho: [name] Other Clinician: Referring Ghassan Coggeshall: [name] Treating Alexandr Yaworski/Extender: Cathie Olden in Treatment: 5 Electronic Signature(s) Signed: 10/26/2017 5:02:07 PM By: Gretta Cool, BSN, RN, CWS, Kim RN, BSN Entered By: Gretta Cool, BSN, RN, CWS, Kim on 10/26/2017 10:36:00 Bradley [name] ([id number]) -------------------------------------------------------------------------------- Multi Wound Chart Details Patient Name: Bradley [name], Bradley A. Date of Service: 10/26/2017 10:00 AM Medical Record Number: [id number] Patient Account Number: [id number] Date of Birth/Sex: [birth year] (72 y.o. M) Treating RN: Roger Shelter Primary Care Leidi Astle: [name] Other Clinician: Referring Iantha Titsworth: [name] Treating Kmari Brian/Extender: Cathie Olden in Treatment: 5 Vital Signs Height(in): Pulse(bpm): 21 Weight(lbs): Blood Pressure(mmHg): 142/63 Body Mass Index(BMI): Temperature(F): 98.3 Respiratory Rate 16 (breaths/min): Photos: [9:No Photos] [N/A:N/A] Wound Location: [9:Coccyx - Midline] [N/A:N/A] Wounding Event: [9:Pressure Injury] [N/A:N/A] Primary Etiology: [9:Pressure Ulcer] [N/A:N/A] Comorbid History: [9:Anemia, Hypertension, History of pressure wounds, Rheumatoid Arthritis, Paraplegia] [N/A:N/A] Date  Acquired: [9:06/06/2014] [N/A:N/A] Weeks of Treatment: [9:5] [N/A:N/A] Wound Status: [9:Open] [N/A:N/A] Measurements L x W x D [9:12x12x0.8] [N/A:N/A] (cm) Area (cm) : [9:113.097] [N/A:N/A] Volume (cm) : [9:90.478] [N/A:N/A] % Reduction in Area: [9:-37.80%] [N/A:N/A] % Reduction in Volume: [9:75.50%] [N/A:N/A] Position 1 (o'clock): [9:1] Maximum Distance 1 (cm): [9:4.2] Tunneling: [9:Yes] [N/A:N/A] Classification: [9:Category/Stage IV] [N/A:N/A] Exudate Amount: [9:Large] [N/A:N/A] Exudate Type: [9:Serosanguineous] [N/A:N/A] Exudate Color: [9:red, brown] [N/A:N/A] Wound Margin: [9:Epibole] [N/A:N/A] Granulation Amount: [9:Large (67-100%)] [N/A:N/A] Granulation Quality: [9:Red, Hyper-granulation] [N/A:N/A] Necrotic Amount: [9:Small (1-33%)] [N/A:N/A] Exposed Structures: [9:Fat Layer (Subcutaneous Tissue) Exposed: Yes Muscle: Yes Fascia: No Tendon: No Joint: No Bone: No] [N/A:N/A]  Epithelialization: [9:None] [N/A:N/A] Debridement: [9:Debridement - Excisional] [N/A:N/A] Pre-procedure 10:42 N/A N/A Verification/Time Out Taken: Pain Control: Other N/A N/A Tissue Debrided: Subcutaneous, Slough N/A N/A Level: Skin/Subcutaneous Tissue N/A N/A Debridement Area (sq cm): 144 N/A N/A Instrument: Curette N/A N/A Bleeding: Moderate N/A N/A Hemostasis Achieved: Silver Nitrate N/A N/A Procedural Pain: 0 N/A N/A Post Procedural Pain: 0 N/A N/A Debridement Treatment Procedure was tolerated well N/A N/A Response: Post Debridement 12x12x0.9 N/A N/A Measurements L x W x D (cm) Post Debridement Volume: 101.788 N/A N/A (cm) Post Debridement Stage: Category/Stage IV N/A N/A Periwound Skin Texture: Scarring: Yes N/A N/A Excoriation: No Induration: No Callus: No Crepitus: No Rash: No Periwound Skin Moisture: Maceration: Yes N/A N/A Dry/Scaly: No Periwound Skin Color: Hemosiderin Staining: Yes N/A N/A Atrophie Blanche: No Cyanosis: No Ecchymosis: No Erythema: No Mottled:  No Pallor: No Rubor: No Temperature: No Abnormality N/A N/A Tenderness on Palpation: No N/A N/A Wound Preparation: Ulcer Cleansing: N/A N/A Rinsed/Irrigated with Saline Topical Anesthetic Applied: Other: Lidocaine 4% Procedures Performed: Debridement N/A N/A Treatment Notes Electronic Signature(s) Signed: 10/26/2017 12:04:20 PM By: Lawanda Cousins Entered By: Lawanda Cousins on 10/26/2017 12:04:20 Bradley [name] ([id number]) -------------------------------------------------------------------------------- [place] Details Patient Name: Bradley [name], Bradley A. Date of Service: 10/26/2017 10:00 AM Medical Record Number: [id number] Patient Account Number: [id number] Date of Birth/Sex: [birth year] (72 y.o. M) Treating RN: Roger Shelter Primary Care Suliman Termini: [name] Other Clinician: Referring Kishaun Erekson: [name] Treating Haden Suder/Extender: Cathie Olden in Treatment: 5 Active Inactive ` Abuse / Safety / Falls / Self Care Management Nursing Diagnoses: Potential for falls Goals: Patient will not experience any injury related to falls Date Initiated: 09/21/2017 Target Resolution Date: 01/13/2018 Goal Status: Active Interventions: Assess Activities of Daily Living upon admission and as needed Assess fall risk on admission and as needed Assess: immobility, friction, shearing, incontinence upon admission and as needed Assess impairment of mobility on admission and as needed per policy Assess personal safety and home safety (as indicated) on admission and as needed Assess self care needs on admission and as needed Notes: ` Nutrition Nursing Diagnoses: Imbalanced nutrition Potential for alteratiion in Nutrition/Potential for imbalanced nutrition Goals: Patient/caregiver agrees to and verbalizes understanding of need to use nutritional supplements and/or vitamins as prescribed Date Initiated: 09/21/2017 Target Resolution Date: 01/13/2018 Goal  Status: Active Interventions: Assess patient nutrition upon admission and as needed per policy Notes: ` Orientation to the Wound Care Program Nursing Diagnoses: Knowledge deficit related to the wound healing center program JAEVIN, MEDEARIS ([id number]) Goals: Patient/caregiver will verbalize understanding of the [facility] Date Initiated: 09/21/2017 Target Resolution Date: 10/14/2017 Goal Status: Active Interventions: Provide education on orientation to the wound center Notes: ` Pressure Nursing Diagnoses: Knowledge deficit related to causes and risk factors for pressure ulcer development Knowledge deficit related to management of pressures ulcers Potential for impaired tissue integrity related to pressure, friction, moisture, and shear Goals: Patient will remain free from development of additional pressure ulcers Date Initiated: 09/21/2017 Target Resolution Date: 01/13/2018 Goal Status: Active Patient/caregiver will verbalize risk factors for pressure ulcer development Date Initiated: 09/21/2017 Target Resolution Date: 02/10/2018 Goal Status: Active Patient/caregiver will verbalize understanding of pressure ulcer management Date Initiated: 09/21/2017 Target Resolution Date: 12/09/2017 Goal Status: Active Interventions: Assess: immobility, friction, shearing, incontinence upon admission and as needed Assess offloading mechanisms upon admission and as needed Assess potential for pressure ulcer upon admission and as needed Provide education on pressure ulcers Notes: ` Wound/Skin Impairment Nursing Diagnoses: Impaired tissue integrity Knowledge  deficit related to ulceration/compromised skin integrity Goals: Ulcer/skin breakdown will have a volume reduction of 80% by week 12 Date Initiated: 09/21/2017 Target Resolution Date: 04/14/2018 Goal Status: Active Interventions: Assess patient/caregiver ability to perform ulcer/skin care regimen upon admission and as  needed Assess ulceration(s) every visit Bradley [name], Bradley [name] ([id number]) Notes: Electronic Signature(s) Signed: 10/26/2017 4:33:19 PM By: Roger Shelter Entered By: Roger Shelter on 10/26/2017 10:41:19 Bradley [name] ([id number]) -------------------------------------------------------------------------------- Pain Assessment Details Patient Name: Bradley Loa A. Date of Service: 10/26/2017 10:00 AM Medical Record Number: [id number] Patient Account Number: [id number] Date of Birth/Sex: 09-12-1945 (72 y.o. M) Treating RN: Cornell Barman Primary Care Rakeen Gaillard: [name] Other Clinician: Referring Vaanya Shambaugh: [name] Treating Kaileena Obi/Extender: Cathie Olden in Treatment: 5 Active Problems Location of Pain Severity and Description of Pain Patient Has Paino No Site Locations Pain Management and Medication Current Pain Management: Electronic Signature(s) Signed: 10/26/2017 5:02:07 PM By: Gretta Cool, BSN, RN, CWS, Kim RN, BSN Entered By: Gretta Cool, BSN, RN, CWS, Kim on 10/26/2017 10:21:47 Bradley [name] ([id number]) -------------------------------------------------------------------------------- Patient/Caregiver Education Details Patient Name: Bradley Loa A. Date of Service: 10/26/2017 10:00 AM Medical Record Number: [id number] Patient Account Number: [id number] Date of Birth/Gender: Apr 06, 1946 (72 y.o. M) Treating RN: Cornell Barman Primary Care Physician: [name] Other Clinician: Referring Physician: [name] Treating Physician/Extender: Cathie Olden in Treatment: 5 Education Assessment Education Provided To: Patient Education Topics Provided Wound/Skin Impairment: Handouts: Caring for Your Ulcer Methods: Demonstration, Explain/Verbal Responses: State content correctly Electronic Signature(s) Signed: 10/26/2017 5:02:07 PM By: Gretta Cool, BSN, RN, CWS, Kim RN, BSN Entered By: Gretta Cool, BSN, RN, CWS, Kim on 10/26/2017 16:42:54 Bradley [name]  ([id number]) -------------------------------------------------------------------------------- Wound Assessment Details Patient Name: Bradley [name], Bradley A. Date of Service: 10/26/2017 10:00 AM Medical Record Number: [id number] Patient Account Number: [id number] Date of Birth/Sex: Sep 22, 1945 (72 y.o. M) Treating RN: Cornell Barman Primary Care Arianna Haydon: [name] Other Clinician: Referring Favour Aleshire: [name] Treating Zeta Bucy/Extender: Lawanda Cousins Weeks in Treatment: 5 Wound Status Wound Number: 9 Primary Pressure Ulcer Etiology: Wound Location: Coccyx - Midline Wound Open Wounding Event: Pressure Injury Status: Date Acquired: 06/06/2014 Comorbid Anemia, Hypertension, History of pressure Weeks Of Treatment: 5 History: wounds, Rheumatoid Arthritis, Paraplegia Clustered Wound: No Photos Photo Uploaded By: Gretta Cool, BSN, RN, CWS, Kim on 10/26/2017 17:09:21 Wound Measurements Length: (cm) 12 % Reducti Width: (cm) 12 % Reducti Depth: (cm) 0.8 Epithelia Area: (cm) 113.097 Tunnelin Volume: (cm) 90.478 Posit Maximu on in Area: -37.8% on in Volume: 75.5% lization: None g: Yes ion (o'clock): 1 m Distance: (cm) 4.2 Undermining: No Wound Description Classification: Category/Stage IV Foul Odor Wound Margin: Epibole Slough/Fi Exudate Amount: Large Exudate Type: Serosanguineous Exudate Color: red, brown After Cleansing: No brino Yes Wound Bed Granulation Amount: Large (67-100%) Exposed Structure Granulation Quality: Red, Hyper-granulation Fascia Exposed: No Necrotic Amount: Small (1-33%) Fat Layer (Subcutaneous Tissue) Exposed: Yes Necrotic Quality: Adherent Slough Tendon Exposed: No Muscle Exposed: Yes Necrosis of Muscle: No Bradley [name], Bradley A. ([id number]) Joint Exposed: No Bone Exposed: No Periwound Skin Texture Texture Color No Abnormalities Noted: No No Abnormalities Noted: No Callus: No Atrophie Blanche: No Crepitus: No Cyanosis: No Excoriation:  No Ecchymosis: No Induration: No Erythema: No Rash: No Hemosiderin Staining: Yes Scarring: Yes Mottled: No Pallor: No Moisture Rubor: No No Abnormalities Noted: No Dry / Scaly: No Temperature / Pain Maceration: Yes Temperature: No Abnormality Wound Preparation Ulcer Cleansing: Rinsed/Irrigated with Saline Topical Anesthetic Applied: Other: Lidocaine 4%, Treatment Notes Wound #9 (Midline Coccyx) Notes dakins soaked gauze packing with dry gauze on top  cover with abd and xtrasorb and Recruitment consultant) Signed: 10/26/2017 5:02:07 PM By: Gretta Cool, BSN, RN, CWS, Kim RN, BSN Entered By: Gretta Cool, BSN, RN, CWS, Kim on 10/26/2017 10:33:43 Bradley [name] ([id number]) -------------------------------------------------------------------------------- Bradley [name] Details Patient Name: Bradley [name] Date of Service: 10/26/2017 10:00 AM Medical Record Number: [id number] Patient Account Number: [id number] Date of Birth/Sex: Sep 30, 1945 (72 y.o. M) Treating RN: Cornell Barman Primary Care Taneasha Fuqua: [name] Other Clinician: Referring Anastasya Jewell: [name] Treating Makeba Delcastillo/Extender: Cathie Olden in Treatment: 5 Vital Signs Time Taken: 10:21 Temperature (F): 98.3 Pulse (bpm): 61 Respiratory Rate (breaths/min): 16 Blood Pressure (mmHg): 142/63 Reference Range: 80 - 120 mg / dl Electronic Signature(s) Signed: 10/26/2017 5:02:07 PM By: Gretta Cool, BSN, RN, CWS, Kim RN, BSN Entered By: Gretta Cool, BSN, RN, CWS, Kim on 10/26/2017 10:22:38

## 2017-10-30 DIAGNOSIS — N401 Enlarged prostate with lower urinary tract symptoms: Secondary | ICD-10-CM | POA: Diagnosis not present

## 2017-10-30 DIAGNOSIS — M069 Rheumatoid arthritis, unspecified: Secondary | ICD-10-CM | POA: Diagnosis not present

## 2017-10-30 DIAGNOSIS — G8220 Paraplegia, unspecified: Secondary | ICD-10-CM | POA: Diagnosis not present

## 2017-10-30 DIAGNOSIS — N319 Neuromuscular dysfunction of bladder, unspecified: Secondary | ICD-10-CM | POA: Diagnosis not present

## 2017-10-30 DIAGNOSIS — M4638 Infection of intervertebral disc (pyogenic), sacral and sacrococcygeal region: Secondary | ICD-10-CM | POA: Diagnosis not present

## 2017-10-30 DIAGNOSIS — I10 Essential (primary) hypertension: Secondary | ICD-10-CM | POA: Diagnosis not present

## 2017-10-30 DIAGNOSIS — Z4800 Encounter for change or removal of nonsurgical wound dressing: Secondary | ICD-10-CM | POA: Diagnosis not present

## 2017-10-30 DIAGNOSIS — L89154 Pressure ulcer of sacral region, stage 4: Secondary | ICD-10-CM | POA: Diagnosis not present

## 2017-10-30 DIAGNOSIS — D649 Anemia, unspecified: Secondary | ICD-10-CM | POA: Diagnosis not present

## 2017-11-01 DIAGNOSIS — Z4800 Encounter for change or removal of nonsurgical wound dressing: Secondary | ICD-10-CM | POA: Diagnosis not present

## 2017-11-01 DIAGNOSIS — N319 Neuromuscular dysfunction of bladder, unspecified: Secondary | ICD-10-CM | POA: Diagnosis not present

## 2017-11-01 DIAGNOSIS — L89154 Pressure ulcer of sacral region, stage 4: Secondary | ICD-10-CM | POA: Diagnosis not present

## 2017-11-01 DIAGNOSIS — I10 Essential (primary) hypertension: Secondary | ICD-10-CM | POA: Diagnosis not present

## 2017-11-01 DIAGNOSIS — M069 Rheumatoid arthritis, unspecified: Secondary | ICD-10-CM | POA: Diagnosis not present

## 2017-11-01 DIAGNOSIS — M4638 Infection of intervertebral disc (pyogenic), sacral and sacrococcygeal region: Secondary | ICD-10-CM | POA: Diagnosis not present

## 2017-11-01 DIAGNOSIS — N401 Enlarged prostate with lower urinary tract symptoms: Secondary | ICD-10-CM | POA: Diagnosis not present

## 2017-11-01 DIAGNOSIS — G8220 Paraplegia, unspecified: Secondary | ICD-10-CM | POA: Diagnosis not present

## 2017-11-01 DIAGNOSIS — D649 Anemia, unspecified: Secondary | ICD-10-CM | POA: Diagnosis not present

## 2017-11-03 DIAGNOSIS — M069 Rheumatoid arthritis, unspecified: Secondary | ICD-10-CM | POA: Diagnosis not present

## 2017-11-03 DIAGNOSIS — L89154 Pressure ulcer of sacral region, stage 4: Secondary | ICD-10-CM | POA: Diagnosis not present

## 2017-11-03 DIAGNOSIS — Z4800 Encounter for change or removal of nonsurgical wound dressing: Secondary | ICD-10-CM | POA: Diagnosis not present

## 2017-11-03 DIAGNOSIS — I10 Essential (primary) hypertension: Secondary | ICD-10-CM | POA: Diagnosis not present

## 2017-11-03 DIAGNOSIS — G8220 Paraplegia, unspecified: Secondary | ICD-10-CM | POA: Diagnosis not present

## 2017-11-03 DIAGNOSIS — N319 Neuromuscular dysfunction of bladder, unspecified: Secondary | ICD-10-CM | POA: Diagnosis not present

## 2017-11-03 DIAGNOSIS — D649 Anemia, unspecified: Secondary | ICD-10-CM | POA: Diagnosis not present

## 2017-11-03 DIAGNOSIS — N401 Enlarged prostate with lower urinary tract symptoms: Secondary | ICD-10-CM | POA: Diagnosis not present

## 2017-11-03 DIAGNOSIS — M4638 Infection of intervertebral disc (pyogenic), sacral and sacrococcygeal region: Secondary | ICD-10-CM | POA: Diagnosis not present

## 2017-11-06 DIAGNOSIS — N401 Enlarged prostate with lower urinary tract symptoms: Secondary | ICD-10-CM | POA: Diagnosis not present

## 2017-11-06 DIAGNOSIS — M069 Rheumatoid arthritis, unspecified: Secondary | ICD-10-CM | POA: Diagnosis not present

## 2017-11-06 DIAGNOSIS — Z4800 Encounter for change or removal of nonsurgical wound dressing: Secondary | ICD-10-CM | POA: Diagnosis not present

## 2017-11-06 DIAGNOSIS — I10 Essential (primary) hypertension: Secondary | ICD-10-CM | POA: Diagnosis not present

## 2017-11-06 DIAGNOSIS — M4638 Infection of intervertebral disc (pyogenic), sacral and sacrococcygeal region: Secondary | ICD-10-CM | POA: Diagnosis not present

## 2017-11-06 DIAGNOSIS — G8220 Paraplegia, unspecified: Secondary | ICD-10-CM | POA: Diagnosis not present

## 2017-11-06 DIAGNOSIS — L89154 Pressure ulcer of sacral region, stage 4: Secondary | ICD-10-CM | POA: Diagnosis not present

## 2017-11-06 DIAGNOSIS — D649 Anemia, unspecified: Secondary | ICD-10-CM | POA: Diagnosis not present

## 2017-11-06 DIAGNOSIS — N319 Neuromuscular dysfunction of bladder, unspecified: Secondary | ICD-10-CM | POA: Diagnosis not present

## 2017-11-08 DIAGNOSIS — M4638 Infection of intervertebral disc (pyogenic), sacral and sacrococcygeal region: Secondary | ICD-10-CM | POA: Diagnosis not present

## 2017-11-08 DIAGNOSIS — I10 Essential (primary) hypertension: Secondary | ICD-10-CM | POA: Diagnosis not present

## 2017-11-08 DIAGNOSIS — Z4800 Encounter for change or removal of nonsurgical wound dressing: Secondary | ICD-10-CM | POA: Diagnosis not present

## 2017-11-08 DIAGNOSIS — G8220 Paraplegia, unspecified: Secondary | ICD-10-CM | POA: Diagnosis not present

## 2017-11-08 DIAGNOSIS — N401 Enlarged prostate with lower urinary tract symptoms: Secondary | ICD-10-CM | POA: Diagnosis not present

## 2017-11-08 DIAGNOSIS — L89154 Pressure ulcer of sacral region, stage 4: Secondary | ICD-10-CM | POA: Diagnosis not present

## 2017-11-08 DIAGNOSIS — D649 Anemia, unspecified: Secondary | ICD-10-CM | POA: Diagnosis not present

## 2017-11-08 DIAGNOSIS — N319 Neuromuscular dysfunction of bladder, unspecified: Secondary | ICD-10-CM | POA: Diagnosis not present

## 2017-11-08 DIAGNOSIS — M069 Rheumatoid arthritis, unspecified: Secondary | ICD-10-CM | POA: Diagnosis not present

## 2017-11-09 ENCOUNTER — Ambulatory Visit: Payer: Medicare HMO | Admitting: Nurse Practitioner

## 2017-11-10 DIAGNOSIS — N401 Enlarged prostate with lower urinary tract symptoms: Secondary | ICD-10-CM | POA: Diagnosis not present

## 2017-11-10 DIAGNOSIS — I10 Essential (primary) hypertension: Secondary | ICD-10-CM | POA: Diagnosis not present

## 2017-11-10 DIAGNOSIS — M4638 Infection of intervertebral disc (pyogenic), sacral and sacrococcygeal region: Secondary | ICD-10-CM | POA: Diagnosis not present

## 2017-11-10 DIAGNOSIS — M6281 Muscle weakness (generalized): Secondary | ICD-10-CM | POA: Diagnosis not present

## 2017-11-10 DIAGNOSIS — G8220 Paraplegia, unspecified: Secondary | ICD-10-CM | POA: Diagnosis not present

## 2017-11-10 DIAGNOSIS — N319 Neuromuscular dysfunction of bladder, unspecified: Secondary | ICD-10-CM | POA: Diagnosis not present

## 2017-11-10 DIAGNOSIS — L89154 Pressure ulcer of sacral region, stage 4: Secondary | ICD-10-CM | POA: Diagnosis not present

## 2017-11-10 DIAGNOSIS — L89313 Pressure ulcer of right buttock, stage 3: Secondary | ICD-10-CM | POA: Diagnosis not present

## 2017-11-10 DIAGNOSIS — L89159 Pressure ulcer of sacral region, unspecified stage: Secondary | ICD-10-CM | POA: Diagnosis not present

## 2017-11-10 DIAGNOSIS — T8189XA Other complications of procedures, not elsewhere classified, initial encounter: Secondary | ICD-10-CM | POA: Diagnosis not present

## 2017-11-10 DIAGNOSIS — L89323 Pressure ulcer of left buttock, stage 3: Secondary | ICD-10-CM | POA: Diagnosis not present

## 2017-11-10 DIAGNOSIS — D649 Anemia, unspecified: Secondary | ICD-10-CM | POA: Diagnosis not present

## 2017-11-10 DIAGNOSIS — Z4800 Encounter for change or removal of nonsurgical wound dressing: Secondary | ICD-10-CM | POA: Diagnosis not present

## 2017-11-10 DIAGNOSIS — M069 Rheumatoid arthritis, unspecified: Secondary | ICD-10-CM | POA: Diagnosis not present

## 2017-11-13 DIAGNOSIS — I10 Essential (primary) hypertension: Secondary | ICD-10-CM | POA: Diagnosis not present

## 2017-11-13 DIAGNOSIS — M4638 Infection of intervertebral disc (pyogenic), sacral and sacrococcygeal region: Secondary | ICD-10-CM | POA: Diagnosis not present

## 2017-11-13 DIAGNOSIS — Z4800 Encounter for change or removal of nonsurgical wound dressing: Secondary | ICD-10-CM | POA: Diagnosis not present

## 2017-11-13 DIAGNOSIS — N401 Enlarged prostate with lower urinary tract symptoms: Secondary | ICD-10-CM | POA: Diagnosis not present

## 2017-11-13 DIAGNOSIS — D649 Anemia, unspecified: Secondary | ICD-10-CM | POA: Diagnosis not present

## 2017-11-13 DIAGNOSIS — M069 Rheumatoid arthritis, unspecified: Secondary | ICD-10-CM | POA: Diagnosis not present

## 2017-11-13 DIAGNOSIS — L89154 Pressure ulcer of sacral region, stage 4: Secondary | ICD-10-CM | POA: Diagnosis not present

## 2017-11-13 DIAGNOSIS — G8220 Paraplegia, unspecified: Secondary | ICD-10-CM | POA: Diagnosis not present

## 2017-11-13 DIAGNOSIS — N319 Neuromuscular dysfunction of bladder, unspecified: Secondary | ICD-10-CM | POA: Diagnosis not present

## 2017-11-15 DIAGNOSIS — I10 Essential (primary) hypertension: Secondary | ICD-10-CM | POA: Diagnosis not present

## 2017-11-15 DIAGNOSIS — M4638 Infection of intervertebral disc (pyogenic), sacral and sacrococcygeal region: Secondary | ICD-10-CM | POA: Diagnosis not present

## 2017-11-15 DIAGNOSIS — D649 Anemia, unspecified: Secondary | ICD-10-CM | POA: Diagnosis not present

## 2017-11-15 DIAGNOSIS — Z4800 Encounter for change or removal of nonsurgical wound dressing: Secondary | ICD-10-CM | POA: Diagnosis not present

## 2017-11-15 DIAGNOSIS — M069 Rheumatoid arthritis, unspecified: Secondary | ICD-10-CM | POA: Diagnosis not present

## 2017-11-15 DIAGNOSIS — L89154 Pressure ulcer of sacral region, stage 4: Secondary | ICD-10-CM | POA: Diagnosis not present

## 2017-11-15 DIAGNOSIS — N401 Enlarged prostate with lower urinary tract symptoms: Secondary | ICD-10-CM | POA: Diagnosis not present

## 2017-11-15 DIAGNOSIS — N319 Neuromuscular dysfunction of bladder, unspecified: Secondary | ICD-10-CM | POA: Diagnosis not present

## 2017-11-15 DIAGNOSIS — G8220 Paraplegia, unspecified: Secondary | ICD-10-CM | POA: Diagnosis not present

## 2017-11-16 ENCOUNTER — Encounter: Payer: Medicare HMO | Attending: Nurse Practitioner | Admitting: Nurse Practitioner

## 2017-11-16 DIAGNOSIS — Z87891 Personal history of nicotine dependence: Secondary | ICD-10-CM | POA: Insufficient documentation

## 2017-11-16 DIAGNOSIS — L89154 Pressure ulcer of sacral region, stage 4: Secondary | ICD-10-CM | POA: Insufficient documentation

## 2017-11-16 DIAGNOSIS — G8220 Paraplegia, unspecified: Secondary | ICD-10-CM | POA: Insufficient documentation

## 2017-11-16 DIAGNOSIS — I10 Essential (primary) hypertension: Secondary | ICD-10-CM | POA: Diagnosis not present

## 2017-11-17 DIAGNOSIS — N401 Enlarged prostate with lower urinary tract symptoms: Secondary | ICD-10-CM | POA: Diagnosis not present

## 2017-11-17 DIAGNOSIS — M069 Rheumatoid arthritis, unspecified: Secondary | ICD-10-CM | POA: Diagnosis not present

## 2017-11-17 DIAGNOSIS — I10 Essential (primary) hypertension: Secondary | ICD-10-CM | POA: Diagnosis not present

## 2017-11-17 DIAGNOSIS — L89154 Pressure ulcer of sacral region, stage 4: Secondary | ICD-10-CM | POA: Diagnosis not present

## 2017-11-17 DIAGNOSIS — G8220 Paraplegia, unspecified: Secondary | ICD-10-CM | POA: Diagnosis not present

## 2017-11-17 DIAGNOSIS — N319 Neuromuscular dysfunction of bladder, unspecified: Secondary | ICD-10-CM | POA: Diagnosis not present

## 2017-11-17 DIAGNOSIS — M4638 Infection of intervertebral disc (pyogenic), sacral and sacrococcygeal region: Secondary | ICD-10-CM | POA: Diagnosis not present

## 2017-11-17 DIAGNOSIS — Z4800 Encounter for change or removal of nonsurgical wound dressing: Secondary | ICD-10-CM | POA: Diagnosis not present

## 2017-11-17 DIAGNOSIS — D649 Anemia, unspecified: Secondary | ICD-10-CM | POA: Diagnosis not present

## 2017-11-19 NOTE — Progress Notes (Signed)
LETICIA, MCDIARMID ([id number]) Visit Report for 11/16/2017 Arrival Information Details Patient Name: Bradley [name], Bradley [name]. Date of Service: 11/16/2017 10:00 AM Medical Record Number: [id number] Patient Account Number: [id number] Date of Birth/Sex: 1945-12-29 (72 y.o. M) Treating RN: Secundino Ginger Primary Care Karmah Potocki: [name] Other Clinician: Referring Jerine Surles: [name] Treating Lynnox Girten/Extender: Cathie Olden in Treatment: 8 Visit Information History Since Last Visit Added or deleted any medications: No Patient Arrived: Wheel Chair Any new allergies or adverse reactions: No Arrival Time: 10:24 Had a fall or experienced change in No Accompanied By: self activities of daily living that may affect Transfer Assistance: Harrel Lemon Lift risk of falls: Patient Identification Verified: Yes Signs or symptoms of abuse/neglect since last visito No Secondary Verification Process Completed: Yes Hospitalized since last visit: No Implantable device outside of the clinic excluding No cellular tissue based products placed in the center since last visit: Has Dressing in Place as Prescribed: Yes Pain Present Now: No Electronic Signature(s) Signed: 11/16/2017 2:00:15 PM By: Secundino Ginger Entered By: Secundino Ginger on 11/16/2017 10:25:28 [name] ([id number]) -------------------------------------------------------------------------------- Encounter Discharge Information Details Patient Name: [name] Date of Service: 11/16/2017 10:00 AM Medical Record Number: [id number] Patient Account Number: [id number] Date of Birth/Sex: 06-25-1945 (72 y.o. M) Treating RN: Roger Shelter Primary Care Soyla Bainter: [name] Other Clinician: Referring Gomer France: [name] Treating Cuauhtemoc Huegel/Extender: Cathie Olden in Treatment: 8 Encounter Discharge Information Items Discharge Condition: Stable Ambulatory Status: Wheelchair Discharge Destination: Home Transportation:  Private Auto Schedule Follow-up Appointment: Yes Clinical Summary of Care: Electronic Signature(s) Signed: 11/16/2017 2:24:38 PM By: Roger Shelter Entered By: Roger Shelter on 11/16/2017 11:10:35 [name] ([id number]) -------------------------------------------------------------------------------- Lower Extremity Assessment Details Patient Name: KRZYSZTOF, REICHELT A. Date of Service: 11/16/2017 10:00 AM Medical Record Number: [id number] Patient Account Number: [id number] Date of Birth/Sex: 01-24-1946 (72 y.o. M) Treating RN: Secundino Ginger Primary Care Destry Bezdek: [name] Other Clinician: Referring Nelline Lio: [name] Treating Vasil Juhasz/Extender: Lawanda Cousins Weeks in Treatment: 8 Edema Assessment Assessed: [Left: Yes] [Right: Yes] [Left: Edema] [Right: :] Electronic Signature(s) Signed: 11/16/2017 2:00:15 PM By: Secundino Ginger Entered By: Secundino Ginger on 11/16/2017 10:29:49 [name] ([id number]) -------------------------------------------------------------------------------- Multi Wound Chart Details Patient Name: SHENANDOAH, VANDERGRIFF A. Date of Service: 11/16/2017 10:00 AM Medical Record Number: [id number] Patient Account Number: [id number] Date of Birth/Sex: 31-Oct-1945 (72 y.o. M) Treating RN: Ahmed Prima Primary Care Joia Doyle: [name] Other Clinician: Referring Diona Peregoy: [name] Treating Coury Grieger/Extender: Cathie Olden in Treatment: 8 Vital Signs Height(in): Pulse(bpm): 66 Weight(lbs): Blood Pressure(mmHg): 124/62 Body Mass Index(BMI): Temperature(F): 97.8 Respiratory Rate 16 (breaths/min): Photos: [N/A:N/A] Wound Location: Coccyx - Midline N/A N/A Wounding Event: Pressure Injury N/A N/A Primary Etiology: Pressure Ulcer N/A N/A Comorbid History: Anemia, Hypertension, History N/A N/A of pressure wounds, Rheumatoid Arthritis, Paraplegia Date Acquired: 06/06/2014 N/A N/A Weeks of Treatment: 8 N/A N/A Wound Status: Open N/A  N/A Measurements L x W x D 12x9x0.9 N/A N/A (cm) Area (cm) : 84.823 N/A N/A Volume (cm) : 76.341 N/A N/A % Reduction in Area: -3.40% N/A N/A % Reduction in Volume: 79.30% N/A N/A Classification: Category/Stage IV N/A N/A Exudate Amount: Large N/A N/A Exudate Type: Serous N/A N/A Exudate Color: amber N/A N/A Wound Margin: Epibole N/A N/A Granulation Amount: Large (67-100%) N/A N/A Granulation Quality: Red, Hyper-granulation N/A N/A Necrotic Amount: Small (1-33%) N/A N/A Exposed Structures: Fat Layer (Subcutaneous N/A N/A Tissue) Exposed: Yes Muscle: Yes Fascia: No Tendon: No HENSON, FRATICELLI A. ([id number]) Joint: No Bone: No Epithelialization: None N/A N/A Debridement: Debridement -  Excisional N/A N/A Pre-procedure 10:43 N/A N/A Verification/Time Out Taken: Pain Control: Lidocaine 4% Topical Solution N/A N/A Tissue Debrided: Subcutaneous, Slough N/A N/A Level: Skin/Subcutaneous Tissue N/A N/A Debridement Area (sq cm): 108 N/A N/A Instrument: Curette N/A N/A Bleeding: Minimum N/A N/A Hemostasis Achieved: Pressure N/A N/A Procedural Pain: 0 N/A N/A Post Procedural Pain: 0 N/A N/A Debridement Treatment Procedure was tolerated well N/A N/A Response: Post Debridement 12x9x1 N/A N/A Measurements L x W x D (cm) Post Debridement Volume: 84.823 N/A N/A (cm) Post Debridement Stage: Category/Stage IV N/A N/A Periwound Skin Texture: Scarring: Yes N/A N/A Excoriation: No Induration: No Callus: No Crepitus: No Rash: No Periwound Skin Moisture: Maceration: Yes N/A N/A Dry/Scaly: No Periwound Skin Color: Hemosiderin Staining: Yes N/A N/A Atrophie Blanche: No Cyanosis: No Ecchymosis: No Erythema: No Mottled: No Pallor: No Rubor: No Temperature: No Abnormality N/A N/A Tenderness on Palpation: No N/A N/A Wound Preparation: Ulcer Cleansing: N/A N/A Rinsed/Irrigated with Saline Topical Anesthetic Applied: Other: Lidocaine 4% Procedures Performed: Debridement N/A  N/A Treatment Notes Wound #9 (Midline Coccyx) 1. Cleansed with: Clean wound with Normal Saline 2. Anesthetic Topical Lidocaine 4% cream to wound bed prior to debridement 4. Dressing Applied: Other dressing (specify in notes) KOHEN, REITHER. ([id number]) Notes dakins/saline 50/50 mix soaked gauze packing with dry gauze on top cover with abd and xtrasorb and tape Electronic Signature(s) Signed: 11/16/2017 11:21:17 AM By: Lawanda Cousins Entered By: Lawanda Cousins on 11/16/2017 11:21:16 [name] ([id number]) -------------------------------------------------------------------------------- [place] Details Patient Name: URBANO, MILHOUSE A. Date of Service: 11/16/2017 10:00 AM Medical Record Number: [id number] Patient Account Number: [id number] Date of Birth/Sex: 02-13-1946 (72 y.o. M) Treating RN: Ahmed Prima Primary Care Adrielle Polakowski: [name] Other Clinician: Referring Margarita Bobrowski: [name] Treating Britian Jentz/Extender: Cathie Olden in Treatment: 8 Active Inactive ` Abuse / Safety / Falls / Self Care Management Nursing Diagnoses: Potential for falls Goals: Patient will not experience any injury related to falls Date Initiated: 09/21/2017 Target Resolution Date: 01/13/2018 Goal Status: Active Interventions: Assess Activities of Daily Living upon admission and as needed Assess fall risk on admission and as needed Assess: immobility, friction, shearing, incontinence upon admission and as needed Assess impairment of mobility on admission and as needed per policy Assess personal safety and home safety (as indicated) on admission and as needed Assess self care needs on admission and as needed Notes: ` Nutrition Nursing Diagnoses: Imbalanced nutrition Potential for alteratiion in Nutrition/Potential for imbalanced nutrition Goals: Patient/caregiver agrees to and verbalizes understanding of need to use nutritional supplements and/or  vitamins as prescribed Date Initiated: 09/21/2017 Target Resolution Date: 01/13/2018 Goal Status: Active Interventions: Assess patient nutrition upon admission and as needed per policy Notes: ` Orientation to the Wound Care Program Nursing Diagnoses: Knowledge deficit related to the wound healing center program ROMONE, SHAFF ([id number]) Goals: Patient/caregiver will verbalize understanding of the [place] Date Initiated: 09/21/2017 Target Resolution Date: 10/14/2017 Goal Status: Active Interventions: Provide education on orientation to the wound center Notes: ` Pressure Nursing Diagnoses: Knowledge deficit related to causes and risk factors for pressure ulcer development Knowledge deficit related to management of pressures ulcers Potential for impaired tissue integrity related to pressure, friction, moisture, and shear Goals: Patient will remain free from development of additional pressure ulcers Date Initiated: 09/21/2017 Target Resolution Date: 01/13/2018 Goal Status: Active Patient/caregiver will verbalize risk factors for pressure ulcer development Date Initiated: 09/21/2017 Target Resolution Date: 02/10/2018 Goal Status: Active Patient/caregiver will verbalize understanding of pressure ulcer management Date Initiated:  09/21/2017 Target Resolution Date: 12/09/2017 Goal Status: Active Interventions: Assess: immobility, friction, shearing, incontinence upon admission and as needed Assess offloading mechanisms upon admission and as needed Assess potential for pressure ulcer upon admission and as needed Provide education on pressure ulcers Notes: ` Wound/Skin Impairment Nursing Diagnoses: Impaired tissue integrity Knowledge deficit related to ulceration/compromised skin integrity Goals: Ulcer/skin breakdown will have a volume reduction of 80% by week 12 Date Initiated: 09/21/2017 Target Resolution Date: 04/14/2018 Goal Status:  Active Interventions: Assess patient/caregiver ability to perform ulcer/skin care regimen upon admission and as needed Assess ulceration(s) every visit VAIDEN, ADAMES ([id number]) Notes: Electronic Signature(s) Signed: 11/16/2017 3:04:13 PM By: Alric Quan Entered By: Alric Quan on 11/16/2017 10:42:21 [name] ([id number]) -------------------------------------------------------------------------------- Pain Assessment Details Patient Name: [name] Date of Service: 11/16/2017 10:00 AM Medical Record Number: [id number] Patient Account Number: [id number] Date of Birth/Sex: [birth year] (72 y.o. M) Treating RN: Secundino Ginger Primary Care Quinnton Bury: [name] Other Clinician: Referring Lyzbeth Genrich: [name] Treating Amiel Mccaffrey/Extender: Cathie Olden in Treatment: 8 Active Problems Location of Pain Severity and Description of Pain Patient Has Paino No Site Locations Pain Management and Medication Current Pain Management: Goals for Pain Management topical or injectable lidocaine is offered to patient for acute pain when surgical debridement is performed. if needed, Patient is instructed to use over the counter pain medication for the following 24-48 hours after debridement. wound care MDs do not prescribe pain medications. patient has chronic pain or uncontrolled pain. patient has been instructed to make appointment with their primary care physician for pain management Electronic Signature(s) Signed: 11/16/2017 10:45:14 AM By: Secundino Ginger Entered By: Secundino Ginger on 11/16/2017 10:45:14 [name] ([id number]) -------------------------------------------------------------------------------- Patient/Caregiver Education Details Patient Name: [name] Date of Service: 11/16/2017 10:00 AM Medical Record Number: [id number] Patient Account Number: [id number] Date of Birth/Gender: 1945/07/09 (72 y.o. M) Treating RN: Roger Shelter Primary Care  Physician: [name] Other Clinician: Referring Physician: [name] Treating Physician/Extender: Cathie Olden in Treatment: 8 Education Assessment Education Provided To: Patient Education Topics Provided Wound Debridement: Handouts: Wound Debridement Methods: Explain/Verbal Responses: State content correctly Wound/Skin Impairment: Handouts: Caring for Your Ulcer Methods: Explain/Verbal Electronic Signature(s) Signed: 11/16/2017 2:24:38 PM By: Roger Shelter Entered By: Roger Shelter on 11/16/2017 11:10:56 [name] ([id number]) -------------------------------------------------------------------------------- Wound Assessment Details Patient Name: [name] Date of Service: 11/16/2017 10:00 AM Medical Record Number: [id number] Patient Account Number: [id number] Date of Birth/Sex: [birth year] (72 y.o. M) Treating RN: Secundino Ginger Primary Care Jaylie Neaves: [name] Other Clinician: Referring Sedrick Tober: [name] Treating Kjirsten Bloodgood/Extender: Lawanda Cousins Weeks in Treatment: 8 Wound Status Wound Number: 9 Primary Pressure Ulcer Etiology: Wound Location: Coccyx - Midline Wound Open Wounding Event: Pressure Injury Status: Date Acquired: 06/06/2014 Comorbid Anemia, Hypertension, History of pressure Weeks Of Treatment: 8 History: wounds, Rheumatoid Arthritis, Paraplegia Clustered Wound: No Photos Photo Uploaded By: Secundino Ginger on 11/16/2017 10:43:23 Wound Measurements Length: (cm) 12 % Reductio Width: (cm) 9 % Reductio Depth: (cm) 0.9 Epithelial Area: (cm) 84.823 Tunneling Volume: (cm) 76.341 Undermini n in Area: -3.4% n in Volume: 79.3% ization: None : No ng: No Wound Description Classification: Category/Stage IV Foul Odor Wound Margin: Epibole Slough/Fi Exudate Amount: Large Exudate Type: Serous Exudate Color: amber After Cleansing: No brino Yes Wound Bed Granulation Amount: Large (67-100%) Exposed  Structure Granulation Quality: Red, Hyper-granulation Fascia Exposed: No Necrotic Amount: Small (1-33%) Fat Layer (Subcutaneous Tissue) Exposed: Yes Necrotic Quality: Adherent Slough Tendon Exposed: No Muscle Exposed: Yes Necrosis of Muscle: No  Joint Exposed: No Bone Exposed: No Periwound Skin Texture ANIEL, HUBBLE A. ([id number]) Texture Color No Abnormalities Noted: No No Abnormalities Noted: No Callus: No Atrophie Blanche: No Crepitus: No Cyanosis: No Excoriation: No Ecchymosis: No Induration: No Erythema: No Rash: No Hemosiderin Staining: Yes Scarring: Yes Mottled: No Pallor: No Moisture Rubor: No No Abnormalities Noted: No Dry / Scaly: No Temperature / Pain Maceration: Yes Temperature: No Abnormality Wound Preparation Ulcer Cleansing: Rinsed/Irrigated with Saline Topical Anesthetic Applied: Other: Lidocaine 4%, Treatment Notes Wound #9 (Midline Coccyx) 1. Cleansed with: Clean wound with Normal Saline 2. Anesthetic Topical Lidocaine 4% cream to wound bed prior to debridement 4. Dressing Applied: Other dressing (specify in notes) Notes dakins/saline 50/50 mix soaked gauze packing with dry gauze on top cover with abd and xtrasorb and tape Electronic Signature(s) Signed: 11/16/2017 2:00:15 PM By: Secundino Ginger Entered By: Secundino Ginger on 11/16/2017 10:29:30 [name] ([id number]) -------------------------------------------------------------------------------- [place] Details Patient Name: [name] Date of Service: 11/16/2017 10:00 AM Medical Record Number: [id number] Patient Account Number: [id number] Date of Birth/Sex: 1946/04/14 (72 y.o. M) Treating RN: Secundino Ginger Primary Care Danniela Mcbrearty: [name] Other Clinician: Referring Riyana Biel: [name] Treating Joel Cowin/Extender: Cathie Olden in Treatment: 8 Vital Signs Time Taken: 10:15 Temperature (F): 97.8 Pulse (bpm): 77 Respiratory Rate (breaths/min): 16 Blood Pressure  (mmHg): 124/62 Reference Range: 80 - 120 mg / dl Electronic Signature(s) Signed: 11/16/2017 2:00:15 PM By: Secundino Ginger Entered By: Secundino Ginger on 11/16/2017 10:26:10

## 2017-11-19 NOTE — Progress Notes (Signed)
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 tested positive for an  antibiotic resistant organism. Patient reportedly has tested positive for osteomyelitis. Patient reportedly has not had testing performed to evaluate circulation in the legs. Patient History Information obtained from Patient. Family History Cancer - Mother, Heart Disease - Mother,Father, Seizures - Child, No family history of Diabetes, Hereditary Spherocytosis, Hypertension, Kidney Disease, Lung Disease, Stroke, Thyroid Problems, Tuberculosis. Social History Former smoker, Marital Status - Single, Alcohol Use - Never, Drug Use - No History, Caffeine Use - Moderate - coffee. Medical History Eyes Denies history of Cataracts, Glaucoma, Optic Neuritis Hematologic/Lymphatic Patient has history of Anemia Denies history of Hemophilia, Human Immunodeficiency Virus, Lymphedema, Sickle Cell Disease MARIANA, GOYTIA A. ([id number]) Respiratory Denies history of Aspiration, Asthma, Chronic Obstructive Pulmonary Disease (COPD), Pneumothorax, Sleep Apnea, Tuberculosis Cardiovascular Patient has history of Hypertension Gastrointestinal Denies history of Cirrhosis , Colitis, Crohn s, Hepatitis A, Hepatitis B, Hepatitis C Endocrine Denies history of Type I Diabetes, Type II Diabetes Genitourinary Denies history of End Stage Renal Disease Immunological Denies history of Lupus Erythematosus, Raynaud s, Scleroderma Integumentary (Skin) Patient has history of History of pressure wounds Musculoskeletal Patient has history of Rheumatoid Arthritis Denies history of Gout, Osteoarthritis, Osteomyelitis Neurologic Patient has history of Paraplegia Denies history of Dementia, Neuropathy, Quadriplegia, Seizure Disorder Oncologic Denies history of Received Chemotherapy, Received Radiation Psychiatric Denies history of Anorexia/bulimia, Confinement Anxiety Hospitalization/Surgery History - 06/29/2013, [facility], Hemorroids. Medical And Surgical History Notes Genitourinary frequent  urination Objective Constitutional Vitals Time Taken: 10:15 AM, Temperature: 97.8 F, Pulse: 77 bpm, Respiratory Rate: 16 breaths/min, Blood Pressure: 124/62 mmHg. Integumentary (Hair, Skin) Wound #9 status is Open. Original cause of wound was Pressure Injury. The wound is located on the Midline Coccyx. The wound measures 12cm length x 9cm width x 0.9cm depth; 84.823cm^2 area and 76.341cm^3 volume. There is muscle and Fat Layer (Subcutaneous Tissue) Exposed exposed. There  is no tunneling or undermining noted. There is a large amount of serous drainage noted. The wound margin is epibole. There is large (67-100%) red, hyper - granulation within the wound bed. There is a small (1-33%) amount of necrotic tissue within the wound bed including Adherent Slough. The periwound skin appearance exhibited: Scarring, Maceration, Hemosiderin Staining. The periwound skin appearance did not exhibit: Callus, Crepitus, Excoriation, Induration, Rash, Dry/Scaly, Atrophie Blanche, Cyanosis, Ecchymosis, Mottled, Pallor, Rubor, Erythema. Periwound temperature was noted as No Abnormality. NIKITA, HUMBLE ([id number]) Assessment Active Problems ICD-10 Pressure ulcer of sacral region, stage 4 Paraplegia, unspecified Other chronic osteomyelitis, other site Procedures Wound #9 Pre-procedure diagnosis of Wound #9 is a Pressure Ulcer located on the Midline Coccyx . There was a Excisional Skin/Subcutaneous Tissue Debridement with a total area of 108 sq cm performed by Lawanda Cousins, NP. With the following instrument(s): Curette to remove Viable and Non-Viable tissue/material. Material removed includes Subcutaneous Tissue, Slough, and Fibrin/Exudate after achieving pain control using Lidocaine 4% Topical Solution. No specimens were taken. A time out was conducted at 10:43, prior to the start of the procedure. A Minimum amount of bleeding was controlled with Pressure. The procedure was tolerated well with a pain level  of 0 throughout and a pain level of 0 following the procedure. Patient s Level of Consciousness post procedure was recorded as Awake and Alert. Post Debridement Measurements: 12cm length x 9cm width x 1cm depth; 84.823cm^3 volume. Post debridement Stage noted as Category/Stage IV. Character of Wound/Ulcer Post Debridement requires further debridement. Post procedure Diagnosis Wound #9: Same as Pre-Procedure Plan Wound Cleansing: Wound #9 Midline Coccyx: Clean wound with Normal Saline. Cleanse wound with mild soap and water Anesthetic (add to Medication List): Wound #9 Midline Coccyx: Topical Lidocaine 4% cream applied to wound bed prior to debridement (In Clinic Only). Skin Barriers/Peri-Wound Care: Wound #9 Midline Coccyx: Skin Prep Primary Wound Dressing: Wound #9 Midline Coccyx: Other: - Dakins Solution mixed with normal saline 1:1 ratio moistened gauze Secondary Dressing: Wound #9 Midline Coccyx: ABD pad Dry Gauze Other - medipore tape Kerra Max Care Super absorbent dressing Dressing Change Frequency: Wound #9 Midline Coccyx: Other: - Three times daily DELLAS, GUARD ([id number]) Follow-up Appointments: Wound #9 Midline Coccyx: Return Appointment in 3 weeks. Off-Loading: Wound #9 Midline Coccyx: Turn and reposition every 2 hours Additional Orders / Instructions: Wound #9 Midline Coccyx: Vitamin A; Vitamin C, Zinc Increase protein intake. Home Health: Wound #9 Midline Coccyx: Continue Home Health Visits - [facility] Nurse may visit PRN to address patient s wound care needs. FACE TO FACE ENCOUNTER: MEDICARE and MEDICAID PATIENTS: I certify that this patient is under my care and that I had a face-to-face encounter that meets the physician face-to-face encounter requirements with this patient on this date. The encounter with the patient was in whole or in part for the following MEDICAL CONDITION: (primary reason for [place]) MEDICAL NECESSITY: I certify,  that based on my findings, NURSING services are a medically necessary home health service. HOME BOUND STATUS: I certify that my clinical findings support that this patient is homebound (i.e., Due to illness or injury, pt requires aid of supportive devices such as crutches, cane, wheelchairs, walkers, the use of special transportation or the assistance of another person to leave their place of residence. There is a normal inability to leave the home and doing so requires considerable and taxing effort. Other absences are for medical reasons / religious services and are infrequent or of short duration  when for other reasons). If current dressing causes regression in wound condition, may D/C ordered dressing product/s and apply Normal Saline Moist Dressing daily until next [place] / Other MD appointment. [place] of regression in wound condition at [phone]. Please direct any NON-WOUND related issues/requests for orders to patient's Primary Care Physician The following medication(s) was prescribed: lidocaine topical 4 % cream 1 1 cream topical was prescribed at facility Electronic Signature(s) Signed: 11/16/2017 11:25:25 AM By: Lawanda Cousins Entered By: Lawanda Cousins on 11/16/2017 11:25:24 [name] ([id number]) -------------------------------------------------------------------------------- ROS/PFSH Details Patient Name: [name] Date of Service: 11/16/2017 10:00 AM Medical Record Number: [id number] Patient Account Number: [id number] Date of Birth/Sex: 07-08-1945 (72 y.o. M) Treating RN: Ahmed Prima Primary Care Provider: [name] Other Clinician: Referring Provider: [name] Treating Provider/Extender: Cathie Olden in Treatment: 8 Label Progress Note Print Version as History and Physical for this encounter Information Obtained From Patient Wound History Do you currently have one or more open woundso Yes How  many open wounds do you currently haveo 1 Approximately how long have you had your woundso 4 years How have you been treating your wound(s) until nowo NPWT Has your wound(s) ever healed and then re-openedo No Have you had any lab work done in the past montho No Have you tested positive for an antibiotic resistant organism (MRSA, VRE)o No Have you tested positive for osteomyelitis (bone infection)o Yes Date: 02/14/2017 Have you had any tests for circulation on your legso No Eyes Medical History: Negative for: Cataracts; Glaucoma; Optic Neuritis Hematologic/Lymphatic Medical History: Positive for: Anemia Negative for: Hemophilia; Human Immunodeficiency Virus; Lymphedema; Sickle Cell Disease Respiratory Medical History: Negative for: Aspiration; Asthma; Chronic Obstructive Pulmonary Disease (COPD); Pneumothorax; Sleep Apnea; Tuberculosis Cardiovascular Medical History: Positive for: Hypertension Gastrointestinal Medical History: Negative for: Cirrhosis ; Colitis; Crohnos; Hepatitis A; Hepatitis B; Hepatitis C Endocrine Medical History: Negative for: Type I Diabetes; Type II Diabetes Genitourinary CLAUD, GOWAN ([id number]) Medical History: Negative for: End Stage Renal Disease Past Medical History Notes: frequent urination Immunological Medical History: Negative for: Lupus Erythematosus; Raynaudos; Scleroderma Integumentary (Skin) Medical History: Positive for: History of pressure wounds Musculoskeletal Medical History: Positive for: Rheumatoid Arthritis Negative for: Gout; Osteoarthritis; Osteomyelitis Neurologic Medical History: Positive for: Paraplegia Negative for: Dementia; Neuropathy; Quadriplegia; Seizure Disorder Oncologic Medical History: Negative for: Received Chemotherapy; Received Radiation Psychiatric Medical History: Negative for: Anorexia/bulimia; Confinement Anxiety Immunizations Pneumococcal Vaccine: Received Pneumococcal Vaccination:  Yes Immunization Notes: up to date Implantable Devices Hospitalization / Surgery History Name of Hospital Purpose of Hospitalization/Surgery Date [facility] Hemorroids 06/29/2013 Family and Social History Cancer: Yes - Mother; Diabetes: No; Heart Disease: Yes - Mother,Father; Hereditary Spherocytosis: No; Hypertension: No; Kidney Disease: No; Lung Disease: No; Seizures: Yes - Child; Stroke: No; Thyroid Problems: No; Tuberculosis: No; Former smoker; Marital Status - Single; Alcohol Use: Never; Drug Use: No History; Caffeine Use: Moderate - coffee; Financial Concerns: No; Food, Clothing or Shelter Needs: No; Support System Lacking: No; Transportation Concerns: No; Advanced Directives: No; Patient does not want information on Advanced Directives; Do not resuscitate: No; Living Will: No; Medical Power of Attorney: No Physician Affirmation I have reviewed and agree with the above information. DAIQUAN, RESNIK ([id number]) Electronic Signature(s) Signed: 11/16/2017 3:04:13 PM By: Alric Quan Signed: 11/16/2017 3:35:38 PM By: Lawanda Cousins Entered By: Lawanda Cousins on 11/16/2017 11:25:08 [name] ([id number]) -------------------------------------------------------------------------------- [facility] Details Patient Name: [name] Date of Service: 11/16/2017 Medical Record Number: [id number] Patient Account Number: [id number] Date of  Birth/Sex: 08/11/1945 (72 y.o. M) Treating RN: Ahmed Prima Primary Care Provider: [name] Other Clinician: Referring Provider: [name] Treating Provider/Extender: Cathie Olden in Treatment: 8 Diagnosis Coding ICD-10 Codes Code Description L89.154 Pressure ulcer of sacral region, stage 4 G82.20 Paraplegia, unspecified M86.68 Other chronic osteomyelitis, other site Facility Procedures CPT4 Code: [id number] Description: 11042 - DEB SUBQ TISSUE 20 SQ CM/< ICD-10 Diagnosis Description L89.154 Pressure ulcer of sacral  region, stage 4 Modifier: Quantity: 1 CPT4 Code: [id number] Description: 11045 - DEB SUBQ TISS EA ADDL 20CM ICD-10 Diagnosis Description L89.154 Pressure ulcer of sacral region, stage 4 Modifier: Quantity: 5 Physician Procedures CPT4 Code: [id number] Description: 11042 - WC PHYS SUBQ TISS 20 SQ CM ICD-10 Diagnosis Description L89.154 Pressure ulcer of sacral region, stage 4 Modifier: Quantity: 1 CPT4 Code: [id number] Description: 37482 - WC PHYS SUBQ TISS EA ADDL 20 CM ICD-10 Diagnosis Description L89.154 Pressure ulcer of sacral region, stage 4 Modifier: Quantity: 5 Electronic Signature(s) Signed: 11/16/2017 11:25:41 AM By: Lawanda Cousins Entered By: Lawanda Cousins on 11/16/2017 11:25:39

## 2017-11-20 DIAGNOSIS — D649 Anemia, unspecified: Secondary | ICD-10-CM | POA: Diagnosis not present

## 2017-11-20 DIAGNOSIS — G8220 Paraplegia, unspecified: Secondary | ICD-10-CM | POA: Diagnosis not present

## 2017-11-20 DIAGNOSIS — N401 Enlarged prostate with lower urinary tract symptoms: Secondary | ICD-10-CM | POA: Diagnosis not present

## 2017-11-20 DIAGNOSIS — N319 Neuromuscular dysfunction of bladder, unspecified: Secondary | ICD-10-CM | POA: Diagnosis not present

## 2017-11-20 DIAGNOSIS — I10 Essential (primary) hypertension: Secondary | ICD-10-CM | POA: Diagnosis not present

## 2017-11-20 DIAGNOSIS — Z4800 Encounter for change or removal of nonsurgical wound dressing: Secondary | ICD-10-CM | POA: Diagnosis not present

## 2017-11-20 DIAGNOSIS — M069 Rheumatoid arthritis, unspecified: Secondary | ICD-10-CM | POA: Diagnosis not present

## 2017-11-20 DIAGNOSIS — M4638 Infection of intervertebral disc (pyogenic), sacral and sacrococcygeal region: Secondary | ICD-10-CM | POA: Diagnosis not present

## 2017-11-20 DIAGNOSIS — L89154 Pressure ulcer of sacral region, stage 4: Secondary | ICD-10-CM | POA: Diagnosis not present

## 2017-11-22 DIAGNOSIS — G8220 Paraplegia, unspecified: Secondary | ICD-10-CM | POA: Diagnosis not present

## 2017-11-22 DIAGNOSIS — D649 Anemia, unspecified: Secondary | ICD-10-CM | POA: Diagnosis not present

## 2017-11-22 DIAGNOSIS — L89154 Pressure ulcer of sacral region, stage 4: Secondary | ICD-10-CM | POA: Diagnosis not present

## 2017-11-22 DIAGNOSIS — N401 Enlarged prostate with lower urinary tract symptoms: Secondary | ICD-10-CM | POA: Diagnosis not present

## 2017-11-22 DIAGNOSIS — Z4800 Encounter for change or removal of nonsurgical wound dressing: Secondary | ICD-10-CM | POA: Diagnosis not present

## 2017-11-22 DIAGNOSIS — I10 Essential (primary) hypertension: Secondary | ICD-10-CM | POA: Diagnosis not present

## 2017-11-22 DIAGNOSIS — N319 Neuromuscular dysfunction of bladder, unspecified: Secondary | ICD-10-CM | POA: Diagnosis not present

## 2017-11-22 DIAGNOSIS — M069 Rheumatoid arthritis, unspecified: Secondary | ICD-10-CM | POA: Diagnosis not present

## 2017-11-22 DIAGNOSIS — M4638 Infection of intervertebral disc (pyogenic), sacral and sacrococcygeal region: Secondary | ICD-10-CM | POA: Diagnosis not present

## 2017-11-24 DIAGNOSIS — G8220 Paraplegia, unspecified: Secondary | ICD-10-CM | POA: Diagnosis not present

## 2017-11-24 DIAGNOSIS — L89154 Pressure ulcer of sacral region, stage 4: Secondary | ICD-10-CM | POA: Diagnosis not present

## 2017-11-24 DIAGNOSIS — I10 Essential (primary) hypertension: Secondary | ICD-10-CM | POA: Diagnosis not present

## 2017-11-24 DIAGNOSIS — N401 Enlarged prostate with lower urinary tract symptoms: Secondary | ICD-10-CM | POA: Diagnosis not present

## 2017-11-24 DIAGNOSIS — D649 Anemia, unspecified: Secondary | ICD-10-CM | POA: Diagnosis not present

## 2017-11-24 DIAGNOSIS — M069 Rheumatoid arthritis, unspecified: Secondary | ICD-10-CM | POA: Diagnosis not present

## 2017-11-24 DIAGNOSIS — Z4800 Encounter for change or removal of nonsurgical wound dressing: Secondary | ICD-10-CM | POA: Diagnosis not present

## 2017-11-24 DIAGNOSIS — M4638 Infection of intervertebral disc (pyogenic), sacral and sacrococcygeal region: Secondary | ICD-10-CM | POA: Diagnosis not present

## 2017-11-24 DIAGNOSIS — N319 Neuromuscular dysfunction of bladder, unspecified: Secondary | ICD-10-CM | POA: Diagnosis not present

## 2017-11-27 DIAGNOSIS — G8220 Paraplegia, unspecified: Secondary | ICD-10-CM | POA: Diagnosis not present

## 2017-11-27 DIAGNOSIS — N401 Enlarged prostate with lower urinary tract symptoms: Secondary | ICD-10-CM | POA: Diagnosis not present

## 2017-11-27 DIAGNOSIS — L89154 Pressure ulcer of sacral region, stage 4: Secondary | ICD-10-CM | POA: Diagnosis not present

## 2017-11-27 DIAGNOSIS — N319 Neuromuscular dysfunction of bladder, unspecified: Secondary | ICD-10-CM | POA: Diagnosis not present

## 2017-11-27 DIAGNOSIS — D649 Anemia, unspecified: Secondary | ICD-10-CM | POA: Diagnosis not present

## 2017-11-27 DIAGNOSIS — Z4800 Encounter for change or removal of nonsurgical wound dressing: Secondary | ICD-10-CM | POA: Diagnosis not present

## 2017-11-27 DIAGNOSIS — M4638 Infection of intervertebral disc (pyogenic), sacral and sacrococcygeal region: Secondary | ICD-10-CM | POA: Diagnosis not present

## 2017-11-27 DIAGNOSIS — M069 Rheumatoid arthritis, unspecified: Secondary | ICD-10-CM | POA: Diagnosis not present

## 2017-11-27 DIAGNOSIS — I10 Essential (primary) hypertension: Secondary | ICD-10-CM | POA: Diagnosis not present

## 2017-11-29 DIAGNOSIS — D649 Anemia, unspecified: Secondary | ICD-10-CM | POA: Diagnosis not present

## 2017-11-29 DIAGNOSIS — N319 Neuromuscular dysfunction of bladder, unspecified: Secondary | ICD-10-CM | POA: Diagnosis not present

## 2017-11-29 DIAGNOSIS — M069 Rheumatoid arthritis, unspecified: Secondary | ICD-10-CM | POA: Diagnosis not present

## 2017-11-29 DIAGNOSIS — I10 Essential (primary) hypertension: Secondary | ICD-10-CM | POA: Diagnosis not present

## 2017-11-29 DIAGNOSIS — G8220 Paraplegia, unspecified: Secondary | ICD-10-CM | POA: Diagnosis not present

## 2017-11-29 DIAGNOSIS — Z4800 Encounter for change or removal of nonsurgical wound dressing: Secondary | ICD-10-CM | POA: Diagnosis not present

## 2017-11-29 DIAGNOSIS — N401 Enlarged prostate with lower urinary tract symptoms: Secondary | ICD-10-CM | POA: Diagnosis not present

## 2017-11-29 DIAGNOSIS — Z933 Colostomy status: Secondary | ICD-10-CM | POA: Diagnosis not present

## 2017-11-29 DIAGNOSIS — L89154 Pressure ulcer of sacral region, stage 4: Secondary | ICD-10-CM | POA: Diagnosis not present

## 2017-12-01 DIAGNOSIS — I10 Essential (primary) hypertension: Secondary | ICD-10-CM | POA: Diagnosis not present

## 2017-12-01 DIAGNOSIS — L89154 Pressure ulcer of sacral region, stage 4: Secondary | ICD-10-CM | POA: Diagnosis not present

## 2017-12-01 DIAGNOSIS — N401 Enlarged prostate with lower urinary tract symptoms: Secondary | ICD-10-CM | POA: Diagnosis not present

## 2017-12-01 DIAGNOSIS — M069 Rheumatoid arthritis, unspecified: Secondary | ICD-10-CM | POA: Diagnosis not present

## 2017-12-01 DIAGNOSIS — D649 Anemia, unspecified: Secondary | ICD-10-CM | POA: Diagnosis not present

## 2017-12-01 DIAGNOSIS — Z4800 Encounter for change or removal of nonsurgical wound dressing: Secondary | ICD-10-CM | POA: Diagnosis not present

## 2017-12-01 DIAGNOSIS — G8220 Paraplegia, unspecified: Secondary | ICD-10-CM | POA: Diagnosis not present

## 2017-12-01 DIAGNOSIS — Z933 Colostomy status: Secondary | ICD-10-CM | POA: Diagnosis not present

## 2017-12-01 DIAGNOSIS — N319 Neuromuscular dysfunction of bladder, unspecified: Secondary | ICD-10-CM | POA: Diagnosis not present

## 2017-12-04 DIAGNOSIS — L89154 Pressure ulcer of sacral region, stage 4: Secondary | ICD-10-CM | POA: Diagnosis not present

## 2017-12-04 DIAGNOSIS — I10 Essential (primary) hypertension: Secondary | ICD-10-CM | POA: Diagnosis not present

## 2017-12-04 DIAGNOSIS — Z933 Colostomy status: Secondary | ICD-10-CM | POA: Diagnosis not present

## 2017-12-04 DIAGNOSIS — G8220 Paraplegia, unspecified: Secondary | ICD-10-CM | POA: Diagnosis not present

## 2017-12-04 DIAGNOSIS — N319 Neuromuscular dysfunction of bladder, unspecified: Secondary | ICD-10-CM | POA: Diagnosis not present

## 2017-12-04 DIAGNOSIS — Z4800 Encounter for change or removal of nonsurgical wound dressing: Secondary | ICD-10-CM | POA: Diagnosis not present

## 2017-12-04 DIAGNOSIS — N401 Enlarged prostate with lower urinary tract symptoms: Secondary | ICD-10-CM | POA: Diagnosis not present

## 2017-12-04 DIAGNOSIS — D649 Anemia, unspecified: Secondary | ICD-10-CM | POA: Diagnosis not present

## 2017-12-04 DIAGNOSIS — M069 Rheumatoid arthritis, unspecified: Secondary | ICD-10-CM | POA: Diagnosis not present

## 2017-12-06 DIAGNOSIS — D649 Anemia, unspecified: Secondary | ICD-10-CM | POA: Diagnosis not present

## 2017-12-06 DIAGNOSIS — N401 Enlarged prostate with lower urinary tract symptoms: Secondary | ICD-10-CM | POA: Diagnosis not present

## 2017-12-06 DIAGNOSIS — G8220 Paraplegia, unspecified: Secondary | ICD-10-CM | POA: Diagnosis not present

## 2017-12-06 DIAGNOSIS — Z933 Colostomy status: Secondary | ICD-10-CM | POA: Diagnosis not present

## 2017-12-06 DIAGNOSIS — N319 Neuromuscular dysfunction of bladder, unspecified: Secondary | ICD-10-CM | POA: Diagnosis not present

## 2017-12-06 DIAGNOSIS — I10 Essential (primary) hypertension: Secondary | ICD-10-CM | POA: Diagnosis not present

## 2017-12-06 DIAGNOSIS — Z4800 Encounter for change or removal of nonsurgical wound dressing: Secondary | ICD-10-CM | POA: Diagnosis not present

## 2017-12-06 DIAGNOSIS — L89154 Pressure ulcer of sacral region, stage 4: Secondary | ICD-10-CM | POA: Diagnosis not present

## 2017-12-06 DIAGNOSIS — M069 Rheumatoid arthritis, unspecified: Secondary | ICD-10-CM | POA: Diagnosis not present

## 2017-12-10 DIAGNOSIS — L89313 Pressure ulcer of right buttock, stage 3: Secondary | ICD-10-CM | POA: Diagnosis not present

## 2017-12-10 DIAGNOSIS — L89159 Pressure ulcer of sacral region, unspecified stage: Secondary | ICD-10-CM | POA: Diagnosis not present

## 2017-12-10 DIAGNOSIS — L89154 Pressure ulcer of sacral region, stage 4: Secondary | ICD-10-CM | POA: Diagnosis not present

## 2017-12-10 DIAGNOSIS — L89323 Pressure ulcer of left buttock, stage 3: Secondary | ICD-10-CM | POA: Diagnosis not present

## 2017-12-10 DIAGNOSIS — T8189XA Other complications of procedures, not elsewhere classified, initial encounter: Secondary | ICD-10-CM | POA: Diagnosis not present

## 2017-12-10 DIAGNOSIS — M6281 Muscle weakness (generalized): Secondary | ICD-10-CM | POA: Diagnosis not present

## 2017-12-10 DIAGNOSIS — G8220 Paraplegia, unspecified: Secondary | ICD-10-CM | POA: Diagnosis not present

## 2017-12-10 DIAGNOSIS — I10 Essential (primary) hypertension: Secondary | ICD-10-CM | POA: Diagnosis not present

## 2017-12-11 DIAGNOSIS — I10 Essential (primary) hypertension: Secondary | ICD-10-CM | POA: Diagnosis not present

## 2017-12-11 DIAGNOSIS — D649 Anemia, unspecified: Secondary | ICD-10-CM | POA: Diagnosis not present

## 2017-12-11 DIAGNOSIS — L89154 Pressure ulcer of sacral region, stage 4: Secondary | ICD-10-CM | POA: Diagnosis not present

## 2017-12-11 DIAGNOSIS — M069 Rheumatoid arthritis, unspecified: Secondary | ICD-10-CM | POA: Diagnosis not present

## 2017-12-11 DIAGNOSIS — G8220 Paraplegia, unspecified: Secondary | ICD-10-CM | POA: Diagnosis not present

## 2017-12-11 DIAGNOSIS — Z4800 Encounter for change or removal of nonsurgical wound dressing: Secondary | ICD-10-CM | POA: Diagnosis not present

## 2017-12-11 DIAGNOSIS — Z933 Colostomy status: Secondary | ICD-10-CM | POA: Diagnosis not present

## 2017-12-11 DIAGNOSIS — N319 Neuromuscular dysfunction of bladder, unspecified: Secondary | ICD-10-CM | POA: Diagnosis not present

## 2017-12-11 DIAGNOSIS — N401 Enlarged prostate with lower urinary tract symptoms: Secondary | ICD-10-CM | POA: Diagnosis not present

## 2017-12-14 ENCOUNTER — Encounter: Payer: Medicare HMO | Attending: Nurse Practitioner | Admitting: Nurse Practitioner

## 2017-12-14 DIAGNOSIS — L89154 Pressure ulcer of sacral region, stage 4: Secondary | ICD-10-CM | POA: Diagnosis not present

## 2017-12-14 DIAGNOSIS — I10 Essential (primary) hypertension: Secondary | ICD-10-CM | POA: Diagnosis not present

## 2017-12-14 DIAGNOSIS — M069 Rheumatoid arthritis, unspecified: Secondary | ICD-10-CM | POA: Diagnosis not present

## 2017-12-14 DIAGNOSIS — Z87891 Personal history of nicotine dependence: Secondary | ICD-10-CM | POA: Diagnosis not present

## 2017-12-14 DIAGNOSIS — G8220 Paraplegia, unspecified: Secondary | ICD-10-CM | POA: Diagnosis not present

## 2017-12-14 DIAGNOSIS — D509 Iron deficiency anemia, unspecified: Secondary | ICD-10-CM | POA: Diagnosis not present

## 2017-12-14 DIAGNOSIS — M8660 Other chronic osteomyelitis, unspecified site: Secondary | ICD-10-CM | POA: Insufficient documentation

## 2017-12-15 DIAGNOSIS — Z4800 Encounter for change or removal of nonsurgical wound dressing: Secondary | ICD-10-CM | POA: Diagnosis not present

## 2017-12-15 DIAGNOSIS — L89154 Pressure ulcer of sacral region, stage 4: Secondary | ICD-10-CM | POA: Diagnosis not present

## 2017-12-15 DIAGNOSIS — N319 Neuromuscular dysfunction of bladder, unspecified: Secondary | ICD-10-CM | POA: Diagnosis not present

## 2017-12-15 DIAGNOSIS — N401 Enlarged prostate with lower urinary tract symptoms: Secondary | ICD-10-CM | POA: Diagnosis not present

## 2017-12-15 DIAGNOSIS — G8220 Paraplegia, unspecified: Secondary | ICD-10-CM | POA: Diagnosis not present

## 2017-12-15 DIAGNOSIS — M069 Rheumatoid arthritis, unspecified: Secondary | ICD-10-CM | POA: Diagnosis not present

## 2017-12-15 DIAGNOSIS — I10 Essential (primary) hypertension: Secondary | ICD-10-CM | POA: Diagnosis not present

## 2017-12-15 DIAGNOSIS — D649 Anemia, unspecified: Secondary | ICD-10-CM | POA: Diagnosis not present

## 2017-12-15 DIAGNOSIS — Z933 Colostomy status: Secondary | ICD-10-CM | POA: Diagnosis not present

## 2017-12-16 NOTE — Progress Notes (Addendum)
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 in 2 weeks 11/16/17-He is here for follow up evaluation for a palliative stage IV sacral pressure ulcer. We will continue with Dakin's wet-to- dry. He will follow-up in 4 weeks. He is expressing concern/complaint  regarding new bed that has arrived, stating he is unable to manipulate/maneuver it due to the bed crank being at the foot of the bed. 12/14/17-He is here for evaluation for palliative stage IV sacral ulcer. He is voicing no complaints or concerns. We will continue with one-to-one ratio of saline and dakins. He will follow-up in 3 weeks Wound History Patient presents with 1 open wound that has been present for approximately 4 years. Patient has been treating wound in the following manner: NPWT. Laboratory tests have not been performed in the last month. Patient reportedly has not tested positive for an antibiotic resistant organism. Patient reportedly has tested positive for osteomyelitis. Patient reportedly has not had testing performed to evaluate circulation in the legs. Patient History Information obtained from Patient. Family History Cancer - Mother, Heart Disease - Mother,Father, Seizures - Child, No family history of Diabetes, Hereditary Spherocytosis, Hypertension, Kidney Disease, Lung Disease, Stroke, Thyroid Problems, Tuberculosis. Social History Former smoker, Marital Status - Single, Alcohol Use - Never, Drug Use - No History, Caffeine Use - Moderate - coffee. Medical History Eyes Denies history of Cataracts, Glaucoma, Optic Neuritis Hematologic/Lymphatic Bradley [name], Bradley [name] ([id number]) Patient has history of Anemia Denies history of Hemophilia, Human Immunodeficiency Virus, Lymphedema, Sickle Cell Disease Respiratory Denies history of Aspiration, Asthma, Chronic Obstructive Pulmonary Disease (COPD), Pneumothorax, Sleep Apnea, Tuberculosis Cardiovascular Patient has history of Hypertension Gastrointestinal Denies history of Cirrhosis , Colitis, Crohn s, Hepatitis A, Hepatitis B, Hepatitis C Endocrine Denies history of Type I Diabetes, Type II Diabetes Genitourinary Denies history of End Stage Renal Disease Immunological Denies history of Lupus Erythematosus, Raynaud s,  Scleroderma Integumentary (Skin) Patient has history of History of pressure wounds Musculoskeletal Patient has history of Rheumatoid Arthritis Denies history of Gout, Osteoarthritis, Osteomyelitis Neurologic Patient has history of Paraplegia Denies history of Dementia, Neuropathy, Quadriplegia, Seizure Disorder Oncologic Denies history  of Received Chemotherapy, Received Radiation Psychiatric Denies history of Anorexia/bulimia, Confinement Anxiety Hospitalization/Surgery History - 06/29/2013, [facility], Hemorroids. Medical And Surgical History Notes Genitourinary frequent urination Objective Constitutional Vitals Time Taken: 10:45 AM, Temperature: 98.3 F, Pulse: 86 bpm, Respiratory Rate: 16 breaths/min, Blood Pressure: 115/59 mmHg. Integumentary (Hair, Skin) Wound #9 status is Open. Original cause of wound was Pressure Injury. The wound is located on the Midline Coccyx. The wound measures 13cm length x 10cm width x 0.9cm depth; 102.102cm^2 area and 91.892cm^3 volume. There is muscle and Fat Layer (Subcutaneous Tissue) Exposed exposed. There is no tunneling noted, however, there is undermining starting at 6:00 and ending at 7:00 with a maximum distance of 0.6cm. There is a large amount of serous drainage noted. The wound margin is epibole. There is large (67-100%) red, hyper - granulation within the wound bed. There is a small (1-33%) amount of necrotic tissue within the wound bed including Adherent Slough. The periwound skin appearance exhibited: Scarring, Maceration, Hemosiderin Staining. The periwound skin appearance did not exhibit: Callus, Crepitus, Excoriation, Induration, Rash, Dry/Scaly, Atrophie Blanche, Cyanosis, Ecchymosis, Mottled, Pallor, Rubor, Erythema. Periwound temperature was LLIAM, HOH ([id number]) noted as No Abnormality. Assessment Active Problems ICD-10 Pressure ulcer of sacral region, stage 4 Paraplegia, unspecified Other chronic osteomyelitis, other  site Procedures Wound #9 Pre-procedure diagnosis of Wound #9 is a Pressure Ulcer located on the Midline Coccyx . There was a Excisional Skin/Subcutaneous Tissue Debridement with a total area of 130 sq cm performed by Lawanda Cousins, NP. With the following instrument(s): Curette to remove Viable and Non-Viable tissue/material. Material removed includes Subcutaneous Tissue, Slough, and Fibrin/Exudate after achieving pain control using Lidocaine 4% Topical Solution. No specimens were taken. A time out was conducted at 11:16, prior to the start of the procedure. A Minimum amount of bleeding was controlled with Pressure. The procedure was tolerated well with a pain level of 0 throughout and a pain level of 0 following the procedure. Patient s Level of Consciousness post procedure was recorded as Awake and Alert. Post Debridement Measurements: 13cm length x 10cm width x 1cm depth; 102.102cm^3 volume. Post debridement Stage noted as Category/Stage IV. Character of Wound/Ulcer Post Debridement requires further debridement. Post procedure Diagnosis Wound #9: Same as Pre-Procedure Plan Wound Cleansing: Wound #9 Midline Coccyx: Clean wound with Normal Saline. Cleanse wound with mild soap and water Anesthetic (add to Medication List): Wound #9 Midline Coccyx: Topical Lidocaine 4% cream applied to wound bed prior to debridement (In Clinic Only). Skin Barriers/Peri-Wound Care: Wound #9 Midline Coccyx: Skin Prep Primary Wound Dressing: Wound #9 Midline Coccyx: Other: - Dakins Solution mixed with normal saline 1:1 ratio moistened gauze Secondary Dressing: Wound #9 Midline Coccyx: ABD pad Dry Gauze Other - medipore tape [facility] absorbent dressing Bradley [name], Bradley [name] ([id number]) Dressing Change Frequency: Wound #9 Midline Coccyx: Other: - Three times daily Follow-up Appointments: Wound #9 Midline Coccyx: Return Appointment in 3 weeks. Off-Loading: Wound #9 Midline Coccyx: Mattress -  hospital bed air fluidized mattress Turn and reposition every 2 hours Additional Orders / Instructions: Wound #9 Midline Coccyx: Vitamin A; Vitamin C, Zinc Increase protein intake. Home Health: Wound #9 Midline Coccyx: Continue Home Health Visits - [place] Nurse may visit PRN to address patient s wound care needs. FACE TO FACE ENCOUNTER: MEDICARE and MEDICAID PATIENTS: I certify that this patient is under my care and that I had a face-to-face encounter that meets the physician face-to-face encounter requirements with this patient on this date. The encounter with  the patient was in whole or in part for the following MEDICAL CONDITION: (primary reason for [place]) MEDICAL NECESSITY: I certify, that based on my findings, NURSING services are a medically necessary home health service. HOME BOUND STATUS: I certify that my clinical findings support that this patient is homebound (i.e., Due to illness or injury, pt requires aid of supportive devices such as crutches, cane, wheelchairs, walkers, the use of special transportation or the assistance of another person to leave their place of residence. There is a normal inability to leave the home and doing so requires considerable and taxing effort. Other absences are for medical reasons / religious services and are infrequent or of short duration when for other reasons). If current dressing causes regression in wound condition, may D/C ordered dressing product/s and apply Normal Saline Moist Dressing daily until next [place] / Other MD appointment. [place] of regression in wound condition at [phone]. Please direct any NON-WOUND related issues/requests for orders to patient's Primary Care Physician The following medication(s) was prescribed: lidocaine topical 4 % cream 1 1 cream topical was prescribed at facility Electronic Signature(s) Signed: 12/21/2017 4:41:05 PM By: Lawanda Cousins Previous  Signature: 12/14/2017 3:04:34 PM Version By: Lawanda Cousins Entered By: Lawanda Cousins on 12/21/2017 16:41:05 Bradley [name] ([id number]) -------------------------------------------------------------------------------- ROS/PFSH Details Patient Name: Bradley Loa A. Date of Service: 12/14/2017 10:30 AM Medical Record Number: [id number] Patient Account Number: [id number] Date of Birth/Sex: [birth year] (72 y.o. M) Treating RN: Ahmed Prima Primary Care Provider: [name] Other Clinician: Referring Provider: [name] Treating Provider/Extender: Cathie Olden in Treatment: 12 Label Progress Note Print Version as History and Physical for this encounter Information Obtained From Patient Wound History Do you currently have one or more open woundso Yes How many open wounds do you currently haveo 1 Approximately how long have you had your woundso 4 years How have you been treating your wound(s) until nowo NPWT Has your wound(s) ever healed and then re-openedo No Have you had any lab work done in the past montho No Have you tested positive for an antibiotic resistant organism (MRSA, VRE)o No Have you tested positive for osteomyelitis (bone infection)o Yes Date: 02/14/2017 Have you had any tests for circulation on your legso No Eyes Medical History: Negative for: Cataracts; Glaucoma; Optic Neuritis Hematologic/Lymphatic Medical History: Positive for: Anemia Negative for: Hemophilia; Human Immunodeficiency Virus; Lymphedema; Sickle Cell Disease Respiratory Medical History: Negative for: Aspiration; Asthma; Chronic Obstructive Pulmonary Disease (COPD); Pneumothorax; Sleep Apnea; Tuberculosis Cardiovascular Medical History: Positive for: Hypertension Gastrointestinal Medical History: Negative for: Cirrhosis ; Colitis; Crohnos; Hepatitis A; Hepatitis B; Hepatitis C Endocrine Medical History: Negative for: Type I Diabetes; Type II Diabetes Genitourinary JOSEANGEL, NETTLETON ([id number]) Medical History: Negative for: End Stage Renal Disease Past Medical History Notes: frequent urination Immunological Medical History: Negative for: Lupus Erythematosus; Raynaudos; Scleroderma Integumentary (Skin) Medical History: Positive for: History of pressure wounds Musculoskeletal Medical History: Positive for: Rheumatoid Arthritis Negative for: Gout; Osteoarthritis; Osteomyelitis Neurologic Medical History: Positive for: Paraplegia Negative for: Dementia; Neuropathy; Quadriplegia; Seizure Disorder Oncologic Medical History: Negative for: Received Chemotherapy; Received Radiation Psychiatric Medical History: Negative for: Anorexia/bulimia; Confinement Anxiety Immunizations Pneumococcal Vaccine: Received Pneumococcal Vaccination: Yes Immunization Notes: up to date Implantable Devices Hospitalization / Surgery History Name of Hospital Purpose of Hospitalization/Surgery Date [facility] Hemorroids 06/29/2013 Family and Social History Cancer: Yes - Mother; Diabetes: No; Heart Disease: Yes - Mother,Father; Hereditary Spherocytosis: No; Hypertension: No; Kidney Disease: No; Lung Disease: No; Seizures: Yes - Child;  Stroke: No; Thyroid Problems: No; Tuberculosis: No; Former smoker; Marital Status - Single; Alcohol Use: Never; Drug Use: No History; Caffeine Use: Moderate - coffee; Financial Concerns: No; Food, Clothing or Shelter Needs: No; Support System Lacking: No; Transportation Concerns: No; Advanced Directives: No; Patient does not want information on Advanced Directives; Do not resuscitate: No; Living Will: No; Medical Power of Attorney: No Physician Affirmation I have reviewed and agree with the above information. TYVON, EGGENBERGER ([id number]) Electronic Signature(s) Signed: 12/14/2017 9:10:59 PM By: Lawanda Cousins Signed: 12/15/2017 11:31:29 AM By: Alric Quan Entered By: Lawanda Cousins on 12/14/2017 15:04:18 Bradley [name]  ([id number]) -------------------------------------------------------------------------------- SuperBill Details Patient Name: Bradley Loa A. Date of Service: 12/14/2017 Medical Record Number: [id number] Patient Account Number: [id number] Date of Birth/Sex: 1946-02-05 (72 y.o. M) Treating RN: Ahmed Prima Primary Care Provider: [name] Other Clinician: Referring Provider: [name] Treating Provider/Extender: Cathie Olden in Treatment: 12 Diagnosis Coding ICD-10 Codes Code Description L89.154 Pressure ulcer of sacral region, stage 4 G82.20 Paraplegia, unspecified M86.68 Other chronic osteomyelitis, other site Facility Procedures CPT4 Code: [id number] Description: 11042 - DEB SUBQ TISSUE 20 SQ CM/< ICD-10 Diagnosis Description L89.154 Pressure ulcer of sacral region, stage 4 Modifier: Quantity: 1 CPT4 Code: 35686168 Description: 11045 - DEB SUBQ TISS EA ADDL 20CM ICD-10 Diagnosis Description L89.154 Pressure ulcer of sacral region, stage 4 Modifier: Quantity: 6 Physician Procedures CPT4 Code: 3729021 Description: 11042 - WC PHYS SUBQ TISS 20 SQ CM ICD-10 Diagnosis Description L89.154 Pressure ulcer of sacral region, stage 4 Modifier: Quantity: 1 CPT4 Code: [id number] Description: 02233 - WC PHYS SUBQ TISS EA ADDL 20 CM ICD-10 Diagnosis Description L89.154 Pressure ulcer of sacral region, stage 4 Modifier: Quantity: 6 Electronic Signature(s) Signed: 12/14/2017 3:23:02 PM By: Lawanda Cousins Entered By: Lawanda Cousins on 12/14/2017 15:23:01

## 2017-12-16 NOTE — Progress Notes (Signed)
MCKINLEY, OLHEISER ([id number]) Visit Report for 12/14/2017 Arrival Information Details Patient Name: Bradley [name], Bradley [name]. Date of Service: 12/14/2017 10:30 AM Medical Record Number: [id number] Patient Account Number: [id number] Date of Birth/Sex: 1945/06/20 (72 y.o. M) Treating RN: Secundino Ginger Primary Care Shalunda Lindh: [name] Other Clinician: Referring Yahshua Thibault: [name] Treating Milarose Savich/Extender: Cathie Olden in Treatment: 12 Visit Information History Since Last Visit Added or deleted any medications: No Patient Arrived: Wheel Chair Any new allergies or adverse reactions: No Arrival Time: 10:45 Had a fall or experienced change in No Accompanied By: self activities of daily living that may affect Transfer Assistance: Harrel Lemon Lift risk of falls: Patient Identification Verified: Yes Signs or symptoms of abuse/neglect since last visito No Secondary Verification Process Completed: Yes Hospitalized since last visit: No Implantable device outside of the clinic excluding No cellular tissue based products placed in the center since last visit: Has Dressing in Place as Prescribed: Yes Pain Present Now: No Electronic Signature(s) Signed: 12/14/2017 3:51:06 PM By: Secundino Ginger Entered By: Secundino Ginger on 12/14/2017 10:46:30 [name] ([id number]) -------------------------------------------------------------------------------- Lower Extremity Assessment Details Patient Name: Bradley Loa A. Date of Service: 12/14/2017 10:30 AM Medical Record Number: [id number] Patient Account Number: [id number] Date of Birth/Sex: 06-Aug-1945 (72 y.o. M) Treating RN: Secundino Ginger Primary Care Ethanjames Fontenot: [name] Other Clinician: Referring Kharson Rasmusson: [name] Treating Demari Kropp/Extender: Lawanda Cousins Weeks in Treatment: 12 Electronic Signature(s) Signed: 12/14/2017 3:51:06 PM By: Secundino Ginger Entered By: Secundino Ginger on 12/14/2017 10:47:29 [name]  ([id number]) -------------------------------------------------------------------------------- Multi Wound Chart Details Patient Name: [name], [name] Date of Service: 12/14/2017 10:30 AM Medical Record Number: [id number] Patient Account Number: [id number] Date of Birth/Sex: [birth year] (72 y.o. M) Treating RN: Ahmed Prima Primary Care Joshlyn Beadle: [name] Other Clinician: Referring Arlana Canizales: [name] Treating Jerrel Tiberio/Extender: Cathie Olden in Treatment: 12 Vital Signs Height(in): Pulse(bpm): 86 Weight(lbs): Blood Pressure(mmHg): 115/59 Body Mass Index(BMI): Temperature(F): 98.3 Respiratory Rate 16 (breaths/min): Photos: [N/A:N/A] Wound Location: Coccyx - Midline N/A N/A Wounding Event: Pressure Injury N/A N/A Primary Etiology: Pressure Ulcer N/A N/A Comorbid History: Anemia, Hypertension, History N/A N/A of pressure wounds, Rheumatoid Arthritis, Paraplegia Date Acquired: 06/06/2014 N/A N/A Weeks of Treatment: 12 N/A N/A Wound Status: Open N/A N/A Measurements L x W x D 13x10x0.9 N/A N/A (cm) Area (cm) : 102.102 N/A N/A Volume (cm) : 91.892 N/A N/A % Reduction in Area: -24.40% N/A N/A % Reduction in Volume: 75.10% N/A N/A Starting Position 1 6 (o'clock): Ending Position 1 7 (o'clock): Maximum Distance 1 (cm): 0.6 Undermining: Yes N/A N/A Classification: Category/Stage IV N/A N/A Exudate Amount: Large N/A N/A Exudate Type: Serous N/A N/A Exudate Color: amber N/A N/A Wound Margin: Epibole N/A N/A Granulation Amount: Large (67-100%) N/A N/A Granulation Quality: Red, Hyper-granulation N/A N/A SADE, MEHLHOFF A. ([id number]) Necrotic Amount: Small (1-33%) N/A N/A Exposed Structures: Fat Layer (Subcutaneous N/A N/A Tissue) Exposed: Yes Muscle: Yes Fascia: No Tendon: No Joint: No Bone: No Epithelialization: None N/A N/A Debridement: Debridement - Excisional N/A N/A Pre-procedure 11:16 N/A N/A Verification/Time Out Taken: Pain  Control: Lidocaine 4% Topical Solution N/A N/A Tissue Debrided: Subcutaneous, Slough N/A N/A Level: Skin/Subcutaneous Tissue N/A N/A Debridement Area (sq cm): 130 N/A N/A Instrument: Curette N/A N/A Bleeding: Minimum N/A N/A Hemostasis Achieved: Pressure N/A N/A Procedural Pain: 0 N/A N/A Post Procedural Pain: 0 N/A N/A Debridement Treatment Procedure was tolerated well N/A N/A Response: Post Debridement 13x10x1 N/A N/A Measurements L x W x D (cm) Post Debridement Volume: 102.102 N/A N/A (cm) Post  Debridement Stage: Category/Stage IV N/A N/A Periwound Skin Texture: Scarring: Yes N/A N/A Excoriation: No Induration: No Callus: No Crepitus: No Rash: No Periwound Skin Moisture: Maceration: Yes N/A N/A Dry/Scaly: No Periwound Skin Color: Hemosiderin Staining: Yes N/A N/A Atrophie Blanche: No Cyanosis: No Ecchymosis: No Erythema: No Mottled: No Pallor: No Rubor: No Temperature: No Abnormality N/A N/A Tenderness on Palpation: No N/A N/A Wound Preparation: Ulcer Cleansing: N/A N/A Rinsed/Irrigated with Saline Topical Anesthetic Applied: Other: Lidocaine 4% Procedures Performed: Debridement N/A N/A Treatment Notes Electronic Signature(s) EUSEBIO, BLAZEJEWSKI ([id number]) Signed: 12/14/2017 3:01:32 PM By: Lawanda Cousins Entered By: Lawanda Cousins on 12/14/2017 15:01:32 [name] ([id number]) -------------------------------------------------------------------------------- [place] Details Patient Name: [name], [name] Date of Service: 12/14/2017 10:30 AM Medical Record Number: [id number] Patient Account Number: [id number] Date of Birth/Sex: 05-23-1946 (72 y.o. M) Treating RN: Ahmed Prima Primary Care Taber Sweetser: [name] Other Clinician: Referring Tameika Heckmann: [name] Treating Janiya Millirons/Extender: Cathie Olden in Treatment: 12 Active Inactive ` Abuse / Safety / Falls / Self Care Management Nursing Diagnoses: Potential  for falls Goals: Patient will not experience any injury related to falls Date Initiated: 09/21/2017 Target Resolution Date: 01/13/2018 Goal Status: Active Interventions: Assess Activities of Daily Living upon admission and as needed Assess fall risk on admission and as needed Assess: immobility, friction, shearing, incontinence upon admission and as needed Assess impairment of mobility on admission and as needed per policy Assess personal safety and home safety (as indicated) on admission and as needed Assess self care needs on admission and as needed Notes: ` Nutrition Nursing Diagnoses: Imbalanced nutrition Potential for alteratiion in Nutrition/Potential for imbalanced nutrition Goals: Patient/caregiver agrees to and verbalizes understanding of need to use nutritional supplements and/or vitamins as prescribed Date Initiated: 09/21/2017 Target Resolution Date: 01/13/2018 Goal Status: Active Interventions: Assess patient nutrition upon admission and as needed per policy Notes: ` Orientation to the Wound Care Program Nursing Diagnoses: Knowledge deficit related to the wound healing center program JAYSHAWN, COLSTON ([id number]) Goals: Patient/caregiver will verbalize understanding of the [place] Date Initiated: 09/21/2017 Target Resolution Date: 10/14/2017 Goal Status: Active Interventions: Provide education on orientation to the wound center Notes: ` Pressure Nursing Diagnoses: Knowledge deficit related to causes and risk factors for pressure ulcer development Knowledge deficit related to management of pressures ulcers Potential for impaired tissue integrity related to pressure, friction, moisture, and shear Goals: Patient will remain free from development of additional pressure ulcers Date Initiated: 09/21/2017 Target Resolution Date: 01/13/2018 Goal Status: Active Patient/caregiver will verbalize risk factors for pressure ulcer development Date  Initiated: 09/21/2017 Target Resolution Date: 02/10/2018 Goal Status: Active Patient/caregiver will verbalize understanding of pressure ulcer management Date Initiated: 09/21/2017 Target Resolution Date: 12/09/2017 Goal Status: Active Interventions: Assess: immobility, friction, shearing, incontinence upon admission and as needed Assess offloading mechanisms upon admission and as needed Assess potential for pressure ulcer upon admission and as needed Provide education on pressure ulcers Notes: ` Wound/Skin Impairment Nursing Diagnoses: Impaired tissue integrity Knowledge deficit related to ulceration/compromised skin integrity Goals: Ulcer/skin breakdown will have a volume reduction of 80% by week 12 Date Initiated: 09/21/2017 Target Resolution Date: 04/14/2018 Goal Status: Active Interventions: Assess patient/caregiver ability to perform ulcer/skin care regimen upon admission and as needed Assess ulceration(s) every visit CONNER, MUEGGE ([id number]) Notes: Electronic Signature(s) Signed: 12/15/2017 11:31:29 AM By: Alric Quan Entered By: Alric Quan on 12/14/2017 11:15:57 [name] ([id number]) -------------------------------------------------------------------------------- Pain Assessment Details Patient Name: Bradley Loa A. Date of Service: 12/14/2017 10:30 AM Medical  Record Number: [id number] Patient Account Number: [id number] Date of Birth/Sex: [birth year] (72 y.o. M) Treating RN: Secundino Ginger Primary Care Shakeel Disney: [name] Other Clinician: Referring Lionel Woodberry: [name] Treating Costantino Kohlbeck/Extender: Cathie Olden in Treatment: 12 Active Problems Location of Pain Severity and Description of Pain Patient Has Paino No Site Locations Pain Management and Medication Current Pain Management: Goals for Pain Management topical or injectable lidocaine is offered to patient for acute pain when surgical debridement is performed. if needed, Patient  is instructed to use over the counter pain medication for the following 24-48 hours after debridement. wound care MDs do not prescribe pain medications. patient has chronic pain or uncontrolled pain. patient has been instructed to make appointment with their primary care physician for pain management. Electronic Signature(s) Signed: 12/14/2017 3:51:06 PM By: Secundino Ginger Entered By: Secundino Ginger on 12/14/2017 11:04:09 [name] ([id number]) -------------------------------------------------------------------------------- Wound Assessment Details Patient Name: CRISTOBAL, ADVANI A. Date of Service: 12/14/2017 10:30 AM Medical Record Number: [id number] Patient Account Number: [id number] Date of Birth/Sex: [birth year] (72 y.o. M) Treating RN: Secundino Ginger Primary Care Isao Seltzer: [name] Other Clinician: Referring Arena Lindahl: [name] Treating Urijah Raynor/Extender: Lawanda Cousins Weeks in Treatment: 12 Wound Status Wound Number: [id number] Primary Pressure Ulcer Etiology: Wound Location: Coccyx - Midline Wound Open Wounding Event: Pressure Injury Status: Date Acquired: 06/06/2014 Comorbid Anemia, Hypertension, History of pressure Weeks Of Treatment: 12 History: wounds, Rheumatoid Arthritis, Paraplegia Clustered Wound: No Photos Photo Uploaded By: Secundino Ginger on 12/14/2017 11:05:14 Wound Measurements Length: (cm) 13 % Reductio Width: (cm) 10 % Reductio Depth: (cm) 0.9 Epithelial Area: (cm) 102.102 Tunneling Volume: (cm) 91.892 Undermini Startin Ending Maximum n in Area: -24.4% n in Volume: 75.1% ization: None : No ng: Yes g Position (o'clock): 6 Position (o'clock): 7 Distance: (cm) 0.6 Wound Description Classification: Category/Stage IV Foul Odor Wound Margin: Epibole Slough/Fi Exudate Amount: Large Exudate Type: Serous Exudate Color: amber After Cleansing: No brino Yes Wound Bed Granulation Amount: Large (67-100%) Exposed Structure Granulation Quality: Red,  Hyper-granulation Fascia Exposed: No Necrotic Amount: Small (1-33%) Fat Layer (Subcutaneous Tissue) Exposed: Yes Necrotic Quality: Adherent Slough Tendon Exposed: No Muscle Exposed: Yes OLON, RUSS A. ([id number]) Necrosis of Muscle: No Joint Exposed: No Bone Exposed: No Periwound Skin Texture Texture Color No Abnormalities Noted: No No Abnormalities Noted: No Callus: No Atrophie Blanche: No Crepitus: No Cyanosis: No Excoriation: No Ecchymosis: No Induration: No Erythema: No Rash: No Hemosiderin Staining: Yes Scarring: Yes Mottled: No Pallor: No Moisture Rubor: No No Abnormalities Noted: No Dry / Scaly: No Temperature / Pain Maceration: Yes Temperature: No Abnormality Wound Preparation Ulcer Cleansing: Rinsed/Irrigated with Saline Topical Anesthetic Applied: Other: Lidocaine 4%, Electronic Signature(s) Signed: 12/14/2017 3:51:06 PM By: Secundino Ginger Entered By: Secundino Ginger on 12/14/2017 11:02:09 [name] ([id number]) -------------------------------------------------------------------------------- Vitals Details Patient Name: Bradley Loa A. Date of Service: 12/14/2017 10:30 AM Medical Record Number: [id number] Patient Account Number: [id number] Date of Birth/Sex: [birth year] (72 y.o. M) Treating RN: Secundino Ginger Primary Care Lasaro Primm: [name] Other Clinician: Referring Khari Lett: [name] Treating Nora Sabey/Extender: Cathie Olden in Treatment: 12 Vital Signs Time Taken: 10:45 Temperature (F): 98.3 Pulse (bpm): 86 Respiratory Rate (breaths/min): 16 Blood Pressure (mmHg): 115/59 Reference Range: 80 - 120 mg / dl Electronic Signature(s) Signed: 12/14/2017 3:51:06 PM By: Secundino Ginger Entered By: Secundino Ginger on 12/14/2017 10:47:15

## 2017-12-18 DIAGNOSIS — G8220 Paraplegia, unspecified: Secondary | ICD-10-CM | POA: Diagnosis not present

## 2017-12-18 DIAGNOSIS — Z4800 Encounter for change or removal of nonsurgical wound dressing: Secondary | ICD-10-CM | POA: Diagnosis not present

## 2017-12-18 DIAGNOSIS — L89154 Pressure ulcer of sacral region, stage 4: Secondary | ICD-10-CM | POA: Diagnosis not present

## 2017-12-18 DIAGNOSIS — M069 Rheumatoid arthritis, unspecified: Secondary | ICD-10-CM | POA: Diagnosis not present

## 2017-12-18 DIAGNOSIS — N319 Neuromuscular dysfunction of bladder, unspecified: Secondary | ICD-10-CM | POA: Diagnosis not present

## 2017-12-18 DIAGNOSIS — N401 Enlarged prostate with lower urinary tract symptoms: Secondary | ICD-10-CM | POA: Diagnosis not present

## 2017-12-18 DIAGNOSIS — D649 Anemia, unspecified: Secondary | ICD-10-CM | POA: Diagnosis not present

## 2017-12-18 DIAGNOSIS — Z933 Colostomy status: Secondary | ICD-10-CM | POA: Diagnosis not present

## 2017-12-18 DIAGNOSIS — I10 Essential (primary) hypertension: Secondary | ICD-10-CM | POA: Diagnosis not present

## 2017-12-20 DIAGNOSIS — N319 Neuromuscular dysfunction of bladder, unspecified: Secondary | ICD-10-CM | POA: Diagnosis not present

## 2017-12-20 DIAGNOSIS — Z4800 Encounter for change or removal of nonsurgical wound dressing: Secondary | ICD-10-CM | POA: Diagnosis not present

## 2017-12-20 DIAGNOSIS — G8220 Paraplegia, unspecified: Secondary | ICD-10-CM | POA: Diagnosis not present

## 2017-12-20 DIAGNOSIS — I10 Essential (primary) hypertension: Secondary | ICD-10-CM | POA: Diagnosis not present

## 2017-12-20 DIAGNOSIS — L89154 Pressure ulcer of sacral region, stage 4: Secondary | ICD-10-CM | POA: Diagnosis not present

## 2017-12-20 DIAGNOSIS — Z933 Colostomy status: Secondary | ICD-10-CM | POA: Diagnosis not present

## 2017-12-20 DIAGNOSIS — N401 Enlarged prostate with lower urinary tract symptoms: Secondary | ICD-10-CM | POA: Diagnosis not present

## 2017-12-20 DIAGNOSIS — M069 Rheumatoid arthritis, unspecified: Secondary | ICD-10-CM | POA: Diagnosis not present

## 2017-12-20 DIAGNOSIS — D649 Anemia, unspecified: Secondary | ICD-10-CM | POA: Diagnosis not present

## 2017-12-22 DIAGNOSIS — L89154 Pressure ulcer of sacral region, stage 4: Secondary | ICD-10-CM | POA: Diagnosis not present

## 2017-12-22 DIAGNOSIS — N319 Neuromuscular dysfunction of bladder, unspecified: Secondary | ICD-10-CM | POA: Diagnosis not present

## 2017-12-22 DIAGNOSIS — Z4800 Encounter for change or removal of nonsurgical wound dressing: Secondary | ICD-10-CM | POA: Diagnosis not present

## 2017-12-22 DIAGNOSIS — G8220 Paraplegia, unspecified: Secondary | ICD-10-CM | POA: Diagnosis not present

## 2017-12-22 DIAGNOSIS — M069 Rheumatoid arthritis, unspecified: Secondary | ICD-10-CM | POA: Diagnosis not present

## 2017-12-22 DIAGNOSIS — Z933 Colostomy status: Secondary | ICD-10-CM | POA: Diagnosis not present

## 2017-12-22 DIAGNOSIS — D649 Anemia, unspecified: Secondary | ICD-10-CM | POA: Diagnosis not present

## 2017-12-22 DIAGNOSIS — I10 Essential (primary) hypertension: Secondary | ICD-10-CM | POA: Diagnosis not present

## 2017-12-22 DIAGNOSIS — N401 Enlarged prostate with lower urinary tract symptoms: Secondary | ICD-10-CM | POA: Diagnosis not present

## 2017-12-25 DIAGNOSIS — L89154 Pressure ulcer of sacral region, stage 4: Secondary | ICD-10-CM | POA: Diagnosis not present

## 2017-12-25 DIAGNOSIS — N319 Neuromuscular dysfunction of bladder, unspecified: Secondary | ICD-10-CM | POA: Diagnosis not present

## 2017-12-25 DIAGNOSIS — D649 Anemia, unspecified: Secondary | ICD-10-CM | POA: Diagnosis not present

## 2017-12-25 DIAGNOSIS — I10 Essential (primary) hypertension: Secondary | ICD-10-CM | POA: Diagnosis not present

## 2017-12-25 DIAGNOSIS — Z933 Colostomy status: Secondary | ICD-10-CM | POA: Diagnosis not present

## 2017-12-25 DIAGNOSIS — Z4800 Encounter for change or removal of nonsurgical wound dressing: Secondary | ICD-10-CM | POA: Diagnosis not present

## 2017-12-25 DIAGNOSIS — N401 Enlarged prostate with lower urinary tract symptoms: Secondary | ICD-10-CM | POA: Diagnosis not present

## 2017-12-25 DIAGNOSIS — G8220 Paraplegia, unspecified: Secondary | ICD-10-CM | POA: Diagnosis not present

## 2017-12-25 DIAGNOSIS — M069 Rheumatoid arthritis, unspecified: Secondary | ICD-10-CM | POA: Diagnosis not present

## 2017-12-27 DIAGNOSIS — Z4800 Encounter for change or removal of nonsurgical wound dressing: Secondary | ICD-10-CM | POA: Diagnosis not present

## 2017-12-27 DIAGNOSIS — D649 Anemia, unspecified: Secondary | ICD-10-CM | POA: Diagnosis not present

## 2017-12-27 DIAGNOSIS — M069 Rheumatoid arthritis, unspecified: Secondary | ICD-10-CM | POA: Diagnosis not present

## 2017-12-27 DIAGNOSIS — Z933 Colostomy status: Secondary | ICD-10-CM | POA: Diagnosis not present

## 2017-12-27 DIAGNOSIS — N319 Neuromuscular dysfunction of bladder, unspecified: Secondary | ICD-10-CM | POA: Diagnosis not present

## 2017-12-27 DIAGNOSIS — N401 Enlarged prostate with lower urinary tract symptoms: Secondary | ICD-10-CM | POA: Diagnosis not present

## 2017-12-27 DIAGNOSIS — I10 Essential (primary) hypertension: Secondary | ICD-10-CM | POA: Diagnosis not present

## 2017-12-27 DIAGNOSIS — G8220 Paraplegia, unspecified: Secondary | ICD-10-CM | POA: Diagnosis not present

## 2017-12-27 DIAGNOSIS — L89154 Pressure ulcer of sacral region, stage 4: Secondary | ICD-10-CM | POA: Diagnosis not present

## 2017-12-29 DIAGNOSIS — Z933 Colostomy status: Secondary | ICD-10-CM | POA: Diagnosis not present

## 2017-12-29 DIAGNOSIS — N319 Neuromuscular dysfunction of bladder, unspecified: Secondary | ICD-10-CM | POA: Diagnosis not present

## 2017-12-29 DIAGNOSIS — D649 Anemia, unspecified: Secondary | ICD-10-CM | POA: Diagnosis not present

## 2017-12-29 DIAGNOSIS — Z4800 Encounter for change or removal of nonsurgical wound dressing: Secondary | ICD-10-CM | POA: Diagnosis not present

## 2017-12-29 DIAGNOSIS — N401 Enlarged prostate with lower urinary tract symptoms: Secondary | ICD-10-CM | POA: Diagnosis not present

## 2017-12-29 DIAGNOSIS — L89154 Pressure ulcer of sacral region, stage 4: Secondary | ICD-10-CM | POA: Diagnosis not present

## 2017-12-29 DIAGNOSIS — G8220 Paraplegia, unspecified: Secondary | ICD-10-CM | POA: Diagnosis not present

## 2017-12-29 DIAGNOSIS — M069 Rheumatoid arthritis, unspecified: Secondary | ICD-10-CM | POA: Diagnosis not present

## 2017-12-29 DIAGNOSIS — I10 Essential (primary) hypertension: Secondary | ICD-10-CM | POA: Diagnosis not present

## 2018-01-03 DIAGNOSIS — I10 Essential (primary) hypertension: Secondary | ICD-10-CM | POA: Diagnosis not present

## 2018-01-03 DIAGNOSIS — N401 Enlarged prostate with lower urinary tract symptoms: Secondary | ICD-10-CM | POA: Diagnosis not present

## 2018-01-03 DIAGNOSIS — D649 Anemia, unspecified: Secondary | ICD-10-CM | POA: Diagnosis not present

## 2018-01-03 DIAGNOSIS — Z933 Colostomy status: Secondary | ICD-10-CM | POA: Diagnosis not present

## 2018-01-03 DIAGNOSIS — L89154 Pressure ulcer of sacral region, stage 4: Secondary | ICD-10-CM | POA: Diagnosis not present

## 2018-01-03 DIAGNOSIS — M069 Rheumatoid arthritis, unspecified: Secondary | ICD-10-CM | POA: Diagnosis not present

## 2018-01-03 DIAGNOSIS — G8220 Paraplegia, unspecified: Secondary | ICD-10-CM | POA: Diagnosis not present

## 2018-01-03 DIAGNOSIS — N319 Neuromuscular dysfunction of bladder, unspecified: Secondary | ICD-10-CM | POA: Diagnosis not present

## 2018-01-03 DIAGNOSIS — Z4800 Encounter for change or removal of nonsurgical wound dressing: Secondary | ICD-10-CM | POA: Diagnosis not present

## 2018-01-04 ENCOUNTER — Encounter: Payer: Medicare HMO | Attending: Nurse Practitioner | Admitting: Nurse Practitioner

## 2018-01-04 DIAGNOSIS — G8220 Paraplegia, unspecified: Secondary | ICD-10-CM | POA: Insufficient documentation

## 2018-01-04 DIAGNOSIS — I10 Essential (primary) hypertension: Secondary | ICD-10-CM | POA: Diagnosis not present

## 2018-01-04 DIAGNOSIS — L89154 Pressure ulcer of sacral region, stage 4: Secondary | ICD-10-CM | POA: Diagnosis not present

## 2018-01-04 DIAGNOSIS — M069 Rheumatoid arthritis, unspecified: Secondary | ICD-10-CM | POA: Insufficient documentation

## 2018-01-05 DIAGNOSIS — M069 Rheumatoid arthritis, unspecified: Secondary | ICD-10-CM | POA: Diagnosis not present

## 2018-01-05 DIAGNOSIS — D649 Anemia, unspecified: Secondary | ICD-10-CM | POA: Diagnosis not present

## 2018-01-05 DIAGNOSIS — N401 Enlarged prostate with lower urinary tract symptoms: Secondary | ICD-10-CM | POA: Diagnosis not present

## 2018-01-05 DIAGNOSIS — Z4800 Encounter for change or removal of nonsurgical wound dressing: Secondary | ICD-10-CM | POA: Diagnosis not present

## 2018-01-05 DIAGNOSIS — G8220 Paraplegia, unspecified: Secondary | ICD-10-CM | POA: Diagnosis not present

## 2018-01-05 DIAGNOSIS — L89154 Pressure ulcer of sacral region, stage 4: Secondary | ICD-10-CM | POA: Diagnosis not present

## 2018-01-05 DIAGNOSIS — I10 Essential (primary) hypertension: Secondary | ICD-10-CM | POA: Diagnosis not present

## 2018-01-05 DIAGNOSIS — Z933 Colostomy status: Secondary | ICD-10-CM | POA: Diagnosis not present

## 2018-01-05 DIAGNOSIS — N319 Neuromuscular dysfunction of bladder, unspecified: Secondary | ICD-10-CM | POA: Diagnosis not present

## 2018-01-08 DIAGNOSIS — D649 Anemia, unspecified: Secondary | ICD-10-CM | POA: Diagnosis not present

## 2018-01-08 DIAGNOSIS — L89154 Pressure ulcer of sacral region, stage 4: Secondary | ICD-10-CM | POA: Diagnosis not present

## 2018-01-08 DIAGNOSIS — Z933 Colostomy status: Secondary | ICD-10-CM | POA: Diagnosis not present

## 2018-01-08 DIAGNOSIS — N319 Neuromuscular dysfunction of bladder, unspecified: Secondary | ICD-10-CM | POA: Diagnosis not present

## 2018-01-08 DIAGNOSIS — I10 Essential (primary) hypertension: Secondary | ICD-10-CM | POA: Diagnosis not present

## 2018-01-08 DIAGNOSIS — G8220 Paraplegia, unspecified: Secondary | ICD-10-CM | POA: Diagnosis not present

## 2018-01-08 DIAGNOSIS — Z4800 Encounter for change or removal of nonsurgical wound dressing: Secondary | ICD-10-CM | POA: Diagnosis not present

## 2018-01-08 DIAGNOSIS — N401 Enlarged prostate with lower urinary tract symptoms: Secondary | ICD-10-CM | POA: Diagnosis not present

## 2018-01-08 DIAGNOSIS — M069 Rheumatoid arthritis, unspecified: Secondary | ICD-10-CM | POA: Diagnosis not present

## 2018-01-10 DIAGNOSIS — L89313 Pressure ulcer of right buttock, stage 3: Secondary | ICD-10-CM | POA: Diagnosis not present

## 2018-01-10 DIAGNOSIS — D649 Anemia, unspecified: Secondary | ICD-10-CM | POA: Diagnosis not present

## 2018-01-10 DIAGNOSIS — N401 Enlarged prostate with lower urinary tract symptoms: Secondary | ICD-10-CM | POA: Diagnosis not present

## 2018-01-10 DIAGNOSIS — N319 Neuromuscular dysfunction of bladder, unspecified: Secondary | ICD-10-CM | POA: Diagnosis not present

## 2018-01-10 DIAGNOSIS — Z4800 Encounter for change or removal of nonsurgical wound dressing: Secondary | ICD-10-CM | POA: Diagnosis not present

## 2018-01-10 DIAGNOSIS — L89323 Pressure ulcer of left buttock, stage 3: Secondary | ICD-10-CM | POA: Diagnosis not present

## 2018-01-10 DIAGNOSIS — M069 Rheumatoid arthritis, unspecified: Secondary | ICD-10-CM | POA: Diagnosis not present

## 2018-01-10 DIAGNOSIS — Z933 Colostomy status: Secondary | ICD-10-CM | POA: Diagnosis not present

## 2018-01-10 DIAGNOSIS — L89154 Pressure ulcer of sacral region, stage 4: Secondary | ICD-10-CM | POA: Diagnosis not present

## 2018-01-10 DIAGNOSIS — G8220 Paraplegia, unspecified: Secondary | ICD-10-CM | POA: Diagnosis not present

## 2018-01-10 DIAGNOSIS — I10 Essential (primary) hypertension: Secondary | ICD-10-CM | POA: Diagnosis not present

## 2018-01-10 DIAGNOSIS — T8189XA Other complications of procedures, not elsewhere classified, initial encounter: Secondary | ICD-10-CM | POA: Diagnosis not present

## 2018-01-10 DIAGNOSIS — M6281 Muscle weakness (generalized): Secondary | ICD-10-CM | POA: Diagnosis not present

## 2018-01-10 DIAGNOSIS — L89159 Pressure ulcer of sacral region, unspecified stage: Secondary | ICD-10-CM | POA: Diagnosis not present

## 2018-01-12 DIAGNOSIS — M069 Rheumatoid arthritis, unspecified: Secondary | ICD-10-CM | POA: Diagnosis not present

## 2018-01-12 DIAGNOSIS — Z933 Colostomy status: Secondary | ICD-10-CM | POA: Diagnosis not present

## 2018-01-12 DIAGNOSIS — N319 Neuromuscular dysfunction of bladder, unspecified: Secondary | ICD-10-CM | POA: Diagnosis not present

## 2018-01-12 DIAGNOSIS — G8220 Paraplegia, unspecified: Secondary | ICD-10-CM | POA: Diagnosis not present

## 2018-01-12 DIAGNOSIS — L89154 Pressure ulcer of sacral region, stage 4: Secondary | ICD-10-CM | POA: Diagnosis not present

## 2018-01-12 DIAGNOSIS — D649 Anemia, unspecified: Secondary | ICD-10-CM | POA: Diagnosis not present

## 2018-01-12 DIAGNOSIS — Z4800 Encounter for change or removal of nonsurgical wound dressing: Secondary | ICD-10-CM | POA: Diagnosis not present

## 2018-01-12 DIAGNOSIS — N401 Enlarged prostate with lower urinary tract symptoms: Secondary | ICD-10-CM | POA: Diagnosis not present

## 2018-01-12 DIAGNOSIS — I10 Essential (primary) hypertension: Secondary | ICD-10-CM | POA: Diagnosis not present

## 2018-01-15 DIAGNOSIS — N401 Enlarged prostate with lower urinary tract symptoms: Secondary | ICD-10-CM | POA: Diagnosis not present

## 2018-01-15 DIAGNOSIS — M069 Rheumatoid arthritis, unspecified: Secondary | ICD-10-CM | POA: Diagnosis not present

## 2018-01-15 DIAGNOSIS — I10 Essential (primary) hypertension: Secondary | ICD-10-CM | POA: Diagnosis not present

## 2018-01-15 DIAGNOSIS — N319 Neuromuscular dysfunction of bladder, unspecified: Secondary | ICD-10-CM | POA: Diagnosis not present

## 2018-01-15 DIAGNOSIS — Z933 Colostomy status: Secondary | ICD-10-CM | POA: Diagnosis not present

## 2018-01-15 DIAGNOSIS — L89154 Pressure ulcer of sacral region, stage 4: Secondary | ICD-10-CM | POA: Diagnosis not present

## 2018-01-15 DIAGNOSIS — Z4800 Encounter for change or removal of nonsurgical wound dressing: Secondary | ICD-10-CM | POA: Diagnosis not present

## 2018-01-15 DIAGNOSIS — G8220 Paraplegia, unspecified: Secondary | ICD-10-CM | POA: Diagnosis not present

## 2018-01-15 DIAGNOSIS — D649 Anemia, unspecified: Secondary | ICD-10-CM | POA: Diagnosis not present

## 2018-01-17 DIAGNOSIS — N401 Enlarged prostate with lower urinary tract symptoms: Secondary | ICD-10-CM | POA: Diagnosis not present

## 2018-01-17 DIAGNOSIS — D649 Anemia, unspecified: Secondary | ICD-10-CM | POA: Diagnosis not present

## 2018-01-17 DIAGNOSIS — M069 Rheumatoid arthritis, unspecified: Secondary | ICD-10-CM | POA: Diagnosis not present

## 2018-01-17 DIAGNOSIS — Z4800 Encounter for change or removal of nonsurgical wound dressing: Secondary | ICD-10-CM | POA: Diagnosis not present

## 2018-01-17 DIAGNOSIS — Z933 Colostomy status: Secondary | ICD-10-CM | POA: Diagnosis not present

## 2018-01-17 DIAGNOSIS — G8220 Paraplegia, unspecified: Secondary | ICD-10-CM | POA: Diagnosis not present

## 2018-01-17 DIAGNOSIS — N319 Neuromuscular dysfunction of bladder, unspecified: Secondary | ICD-10-CM | POA: Diagnosis not present

## 2018-01-17 DIAGNOSIS — I10 Essential (primary) hypertension: Secondary | ICD-10-CM | POA: Diagnosis not present

## 2018-01-17 DIAGNOSIS — L89154 Pressure ulcer of sacral region, stage 4: Secondary | ICD-10-CM | POA: Diagnosis not present

## 2018-01-17 NOTE — Progress Notes (Signed)
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 home health. He is not accompanied by anyone at this visit. He will follow up in two  weeks per his request/preference. 10/12/17 on evaluation today patient appears to be doing very well. The Dakin solution went to dry packings do seem to be helping him as far as the sacral wound is concerned I'm not seeing anything that has me more concerned as far as infection or otherwise is concerned. Overall I'm pleased with the appearance of the wound. 10/26/17-He is here in follow up evaluation for a stage IV sacral ulcer. He continues with daily Dakin's wet-to-dry. He is voicing no complaints or concerns. He will follow-up in 2 weeks 11/16/17-He is here for follow up evaluation for a palliative stage IV sacral pressure ulcer. We will continue with Dakin's wet-to- dry. He will follow-up in 4 weeks. He is expressing concern/complaint regarding new bed that has arrived, stating he is unable to manipulate/maneuver it due to the bed crank being at the foot of the bed. 12/14/17-He is here for evaluation for palliative stage IV sacral ulcer. He is voicing no complaints or concerns. We will continue with one-to-one ratio of saline and dakins. He will follow-up in 3 weeks 01/04/18-He is here in follow-up evaluation for palliative stage IV sacral ulcer. He is inquiring about reinstating the negative pressure wound therapy. We discussed at length that the negative pressure wound therapy in the home setting has not been successful for him repeatedly with loss of cereal and unavailability of 24/7 help; reminded him that home health is not available 24/7 when loss of seal occurs. He does verbalize understanding to this and does not pursue. We also discussed the palliative nature of this ulcer (given no significant change/improvement in measurement/appearance, not a candidate for muscle flap per plastic surgery, and continued independent living) and that the goal is for maintenance, decrease in infection and minimizing/avoiding deterioration given that he is  independent in his care, does not have home health and  requires daily dressing changes secondary to drainage amount. He is inquiring about a wound clinic in [facility], I have informed him that I am unfamiliar with that clinic but that he is encouraged to seek another opinion if that is his desire. We will continue with dakins and he will follow up in three weeks Objective Constitutional MORAD, TAL A. ([id number]) Vitals Time Taken: 10:51 AM, Temperature: 98.9 F, Pulse: 84 bpm, Respiratory Rate: 16 breaths/min, Blood Pressure: 112/52 mmHg. Integumentary (Hair, Skin) Wound #9 status is Open. Original cause of wound was Pressure Injury. The wound is located on the Midline Coccyx. The wound measures 13cm length x 9cm width x 0.9cm depth; 91.892cm^2 area and 82.702cm^3 volume. There is muscle and Fat Layer (Subcutaneous Tissue) Exposed exposed. There is undermining starting at 2:00 and ending at 4:00 with a maximum distance of 0.5cm. There is additional undermining and at 7:00 and ending at 8:00 with a maximum distance of 0.4cm. There is a large amount of serous drainage noted. The wound margin is epibole. There is large (67-100%) red, hyper - granulation within the wound bed. There is a small (1-33%) amount of necrotic tissue within the wound bed including Adherent Slough. The periwound skin appearance exhibited: Scarring, Maceration, Hemosiderin Staining. The periwound skin appearance did not exhibit: Callus, Crepitus, Excoriation, Induration, Rash, Dry/Scaly, Atrophie Blanche, Cyanosis, Ecchymosis, Mottled, Pallor, Rubor, Erythema. Periwound temperature was noted as No Abnormality. Assessment Active Problems ICD-10 Pressure ulcer of sacral region, stage 4 Paraplegia, unspecified Other chronic osteomyelitis, other site Procedures Wound #9 Pre-procedure diagnosis of Wound #9 is a Pressure Ulcer located on the Midline Coccyx . There was a Excisional Skin/Subcutaneous Tissue Debridement with a total area of 117 sq cm performed by Lawanda Cousins, NP.  With the following instrument(s): Curette to remove Viable and Non-Viable tissue/material. Material removed includes Subcutaneous Tissue, Slough, and Fibrin/Exudate after achieving pain control using Lidocaine 4% Topical Solution. No specimens were taken. A time out was conducted at 11:20, prior to the start of the procedure. A Moderate amount of bleeding was controlled with Pressure. The procedure was tolerated well with a pain level of 0 throughout and a pain level of 0 following the procedure. Patient s Level of Consciousness post procedure was recorded as Awake and Alert. Post Debridement Measurements: 13cm length x 9cm width x 1cm depth; 91.892cm^3 volume. Post debridement Stage noted as Category/Stage IV. Character of Wound/Ulcer Post Debridement requires further debridement. Post procedure Diagnosis Wound #9: Same as Pre-Procedure Plan Wound Cleansing: Wound #9 Midline Coccyx: Clean wound with Normal Saline. Cleanse wound with mild soap and water Anesthetic (add to Medication List): BRANDLEY, ALDRETE ([id number]) Wound #9 Midline Coccyx: Topical Lidocaine 4% cream applied to wound bed prior to debridement (In Clinic Only). Skin Barriers/Peri-Wound Care: Wound #9 Midline Coccyx: Skin Prep Primary Wound Dressing: Wound #9 Midline Coccyx: Other: - Dakins Solution mixed with normal saline 1:1 ratio moistened gauze Secondary Dressing: Wound #9 Midline Coccyx: ABD pad Dry Gauze Other - medipore tape Kerra Max Care Super absorbent dressing Dressing Change Frequency: Wound #9 Midline Coccyx: Other: - Three times daily Follow-up Appointments: Wound #9 Midline Coccyx: Return Appointment in 3 weeks. Off-Loading: Wound #9 Midline Coccyx: Mattress - hospital bed air fluidized mattress Turn and reposition every 2 hours Additional Orders / Instructions: Wound #9 Midline Coccyx: Vitamin A; Vitamin C, Zinc Increase protein intake. Home Health: Wound #9 Midline Coccyx: Continue Home  Health Visits - [place]  Nurse may visit PRN to address patient s wound care needs. FACE TO FACE ENCOUNTER: MEDICARE and MEDICAID PATIENTS: I certify that this patient is under my care and that I had a face-to-face encounter that meets the physician face-to-face encounter requirements with this patient on this date. The encounter with the patient was in whole or in part for the following MEDICAL CONDITION: (primary reason for [place]) MEDICAL NECESSITY: I certify, that based on my findings, NURSING services are a medically necessary home health service. HOME BOUND STATUS: I certify that my clinical findings support that this patient is homebound (i.e., Due to illness or injury, pt requires aid of supportive devices such as crutches, cane, wheelchairs, walkers, the use of special transportation or the assistance of another person to leave their place of residence. There is a normal inability to leave the home and doing so requires considerable and taxing effort. Other absences are for medical reasons / religious services and are infrequent or of short duration when for other reasons). If current dressing causes regression in wound condition, may D/C ordered dressing product/s and apply Normal Saline Moist Dressing daily until next Lead / Other MD appointment. [facility] of regression in wound condition at [phone]. Please direct any NON-WOUND related issues/requests for orders to patient's Primary Care Physician The following medication(s) was prescribed: lidocaine topical 4 % cream 1 1 cream topical was prescribed at facility Electronic Signature(s) Signed: 01/04/2018 12:53:20 PM By: Lawanda Cousins Entered By: Lawanda Cousins on 01/04/2018 12:53:20 [name] ([id number]) -------------------------------------------------------------------------------- [facility] Details Patient Name: [name] Date of Service: 01/04/2018 Medical Record  Number: [id number] Patient Account Number: [id number] Date of Birth/Sex: February 27, 1946 (72 y.o. M) Treating RN: Ahmed Prima Primary Care Provider: [name] Other Clinician: Referring Provider: [name] Treating Provider/Extender: Cathie Olden in Treatment: 15 Diagnosis Coding ICD-10 Codes Code Description L89.154 Pressure ulcer of sacral region, stage 4 G82.20 Paraplegia, unspecified M86.68 Other chronic osteomyelitis, other site Facility Procedures CPT4 Code: [id number] Description: [place] - DEB SUBQ TISSUE 20 SQ CM/< ICD-10 Diagnosis Description L89.154 Pressure ulcer of sacral region, stage 4 Modifier: Quantity: 1 CPT4 Code: [id number] Description: 11045 - DEB SUBQ TISS EA ADDL 20CM ICD-10 Diagnosis Description L89.154 Pressure ulcer of sacral region, stage 4 Modifier: Quantity: 5 Physician Procedures CPT4 Code: [id number] Description: 11042 - WC PHYS SUBQ TISS 20 SQ CM ICD-10 Diagnosis Description L89.154 Pressure ulcer of sacral region, stage 4 Modifier: Quantity: 1 CPT4 Code: [id number] Description: 91505 - WC PHYS SUBQ TISS EA ADDL 20 CM ICD-10 Diagnosis Description L89.154 Pressure ulcer of sacral region, stage 4 Modifier: Quantity: 5 Electronic Signature(s) Signed: 01/04/2018 12:53:33 PM By: Lawanda Cousins Entered By: Lawanda Cousins on 01/04/2018 12:53:33

## 2018-01-17 NOTE — Progress Notes (Signed)
STRYKER, VEASEY ([id number]) Visit Report for 01/04/2018 Arrival Information Details Patient Name: Bradley [name], Bradley [name]. Date of Service: 01/04/2018 10:30 AM Medical Record Number: [id number] Patient Account Number: [id number] Date of Birth/Sex: 1945/11/06 (72 y.o. M) Treating RN: Cornell Barman Primary Care Allegra Cerniglia: [name] Other Clinician: Referring Evertte Sones: [name] Treating Wendall Isabell/Extender: Cathie Olden in Treatment: 15 Visit Information History Since Last Visit Added or deleted any medications: No Patient Arrived: Wheel Chair Any new allergies or adverse reactions: No Arrival Time: 10:49 Had a fall or experienced change in No Accompanied By: self activities of daily living that may affect Transfer Assistance: Harrel Lemon Lift risk of falls: Patient Identification Verified: Yes Signs or symptoms of abuse/neglect since last visito No Secondary Verification Process Completed: Yes Hospitalized since last visit: No Implantable device outside of the clinic excluding No cellular tissue based products placed in the center since last visit: Has Dressing in Place as Prescribed: Yes Pain Present Now: No Electronic Signature(s) Signed: 01/05/2018 6:17:48 PM By: Gretta Cool, BSN, RN, CWS, Kim RN, BSN Entered By: Gretta Cool, BSN, RN, CWS, Kim on 01/04/2018 10:50:08 [name] ([id number]) -------------------------------------------------------------------------------- Encounter Discharge Information Details Patient Name: Bradley Loa A. Date of Service: 01/04/2018 10:30 AM Medical Record Number: [id number] Patient Account Number: [id number] Date of Birth/Sex: Jul 26, 1945 (72 y.o. M) Treating RN: Roger Shelter Primary Care Admir Candelas: [name] Other Clinician: Referring Yina Riviere: [name] Treating Ewart Carrera/Extender: Cathie Olden in Treatment: 15 Encounter Discharge Information Items Discharge Condition: Stable Ambulatory Status: Wheelchair Discharge  Destination: Home Transportation: Private Auto Schedule Follow-up Appointment: No Clinical Summary of Care: Electronic Signature(s) Signed: 01/05/2018 4:54:12 PM By: Roger Shelter Entered By: Roger Shelter on 01/04/2018 11:47:53 [name] ([id number]) -------------------------------------------------------------------------------- Lower Extremity Assessment Details Patient Name: [name], [name] Date of Service: 01/04/2018 10:30 AM Medical Record Number: [id number] Patient Account Number: [id number] Date of Birth/Sex: 05/03/1946 (72 y.o. M) Treating RN: Cornell Barman Primary Care Terrianna Holsclaw: [name] Other Clinician: Referring Krishauna Schatzman: [name] Treating Mccall Will/Extender: Cathie Olden in Treatment: 15 Electronic Signature(s) Signed: 01/05/2018 6:17:48 PM By: Gretta Cool, BSN, RN, CWS, Kim RN, BSN Entered By: Gretta Cool, BSN, RN, CWS, Kim on 01/04/2018 10:58:44 [name] ([id number]) -------------------------------------------------------------------------------- Multi Wound Chart Details Patient Name: [name], [name] Date of Service: 01/04/2018 10:30 AM Medical Record Number: [id number] Patient Account Number: [id number] Date of Birth/Sex: [birth year] (72 y.o. M) Treating RN: Ahmed Prima Primary Care Gretta Samons: [name] Other Clinician: Referring Loie Jahr: [name] Treating Ivalee Strauser/Extender: Cathie Olden in Treatment: 15 Vital Signs Height(in): Pulse(bpm): 51 Weight(lbs): Blood Pressure(mmHg): 112/52 Body Mass Index(BMI): Temperature(F): 98.9 Respiratory Rate 16 (breaths/min): Photos: [9:No Photos] [N/A:N/A] Wound Location: [9:Coccyx - Midline] [N/A:N/A] Wounding Event: [9:Pressure Injury] [N/A:N/A] Primary Etiology: [9:Pressure Ulcer] [N/A:N/A] Comorbid History: [9:Anemia, Hypertension, History of pressure wounds, Rheumatoid Arthritis, Paraplegia] [N/A:N/A] Date Acquired: [9:06/06/2014] [N/A:N/A] Weeks of Treatment:  [9:15] [N/A:N/A] Wound Status: [9:Open] [N/A:N/A] Measurements L x W x D [9:13x9x0.9] [N/A:N/A] (cm) Area (cm) : [9:91.892] [N/A:N/A] Volume (cm) : [9:82.702] [N/A:N/A] % Reduction in Area: [9:-12.00%] [N/A:N/A] % Reduction in Volume: [9:77.60%] [N/A:N/A] Starting Position 1 [9:2] (o'clock): Ending Position 1 [9:4] (o'clock): Maximum Distance 1 (cm): [9:0.5] Starting Position 2 [9:7] (o'clock): Ending Position 2 [9:8] (o'clock): Maximum Distance 2 (cm): [9:0.4] Undermining: [9:Yes] [N/A:N/A] Classification: [9:Category/Stage IV] [N/A:N/A] Exudate Amount: [9:Large] [N/A:N/A] Exudate Type: [9:Serous] [N/A:N/A] Exudate Color: [9:amber] [N/A:N/A] Wound Margin: [9:Epibole] [N/A:N/A] Granulation Amount: [9:Large (67-100%)] [N/A:N/A] Granulation Quality: [9:Red, Hyper-granulation] [N/A:N/A] Necrotic Amount: [9:Small (1-33%)] [N/A:N/A] Exposed Structures: [N/A:N/A] Fat Layer (Subcutaneous Tissue) Exposed: Yes Muscle:  Yes Fascia: No Tendon: No Joint: No Bone: No Epithelialization: None N/A N/A Periwound Skin Texture: Scarring: Yes N/A N/A Excoriation: No Induration: No Callus: No Crepitus: No Rash: No Periwound Skin Moisture: Maceration: Yes N/A N/A Dry/Scaly: No Periwound Skin Color: Hemosiderin Staining: Yes N/A N/A Atrophie Blanche: No Cyanosis: No Ecchymosis: No Erythema: No Mottled: No Pallor: No Rubor: No Temperature: No Abnormality N/A N/A Tenderness on Palpation: No N/A N/A Wound Preparation: Ulcer Cleansing: N/A N/A Rinsed/Irrigated with Saline Topical Anesthetic Applied: Other: Lidocaine 4% Treatment Notes Electronic Signature(s) Signed: 01/08/2018 4:59:33 PM By: Alric Quan Entered By: Alric Quan on 01/04/2018 11:17:11 [name] ([id number]) -------------------------------------------------------------------------------- [place] Details Patient Name: KYLLE, [name] Date of Service: 01/04/2018 10:30  AM Medical Record Number: [id number] Patient Account Number: [id number] Date of Birth/Sex: 1945-09-03 (72 y.o. M) Treating RN: Ahmed Prima Primary Care Maame Dack: [name] Other Clinician: Referring Luba Matzen: [name] Treating Brighton Pilley/Extender: Cathie Olden in Treatment: 15 Active Inactive ` Abuse / Safety / Falls / Self Care Management Nursing Diagnoses: Potential for falls Goals: Patient will not experience any injury related to falls Date Initiated: 09/21/2017 Target Resolution Date: 01/13/2018 Goal Status: Active Interventions: Assess Activities of Daily Living upon admission and as needed Assess fall risk on admission and as needed Assess: immobility, friction, shearing, incontinence upon admission and as needed Assess impairment of mobility on admission and as needed per policy Assess personal safety and home safety (as indicated) on admission and as needed Assess self care needs on admission and as needed Notes: ` Nutrition Nursing Diagnoses: Imbalanced nutrition Potential for alteratiion in Nutrition/Potential for imbalanced nutrition Goals: Patient/caregiver agrees to and verbalizes understanding of need to use nutritional supplements and/or vitamins as prescribed Date Initiated: 09/21/2017 Target Resolution Date: 01/13/2018 Goal Status: Active Interventions: Assess patient nutrition upon admission and as needed per policy Notes: ` Orientation to the Wound Care Program Nursing Diagnoses: Knowledge deficit related to the wound healing center program MOUHAMAD, TEED ([id number]) Goals: Patient/caregiver will verbalize understanding of the [facility] Date Initiated: 09/21/2017 Target Resolution Date: 10/14/2017 Goal Status: Active Interventions: Provide education on orientation to the wound center Notes: ` Pressure Nursing Diagnoses: Knowledge deficit related to causes and risk factors for pressure ulcer  development Knowledge deficit related to management of pressures ulcers Potential for impaired tissue integrity related to pressure, friction, moisture, and shear Goals: Patient will remain free from development of additional pressure ulcers Date Initiated: 09/21/2017 Target Resolution Date: 01/13/2018 Goal Status: Active Patient/caregiver will verbalize risk factors for pressure ulcer development Date Initiated: 09/21/2017 Target Resolution Date: 02/10/2018 Goal Status: Active Patient/caregiver will verbalize understanding of pressure ulcer management Date Initiated: 09/21/2017 Target Resolution Date: 12/09/2017 Goal Status: Active Interventions: Assess: immobility, friction, shearing, incontinence upon admission and as needed Assess offloading mechanisms upon admission and as needed Assess potential for pressure ulcer upon admission and as needed Provide education on pressure ulcers Notes: ` Wound/Skin Impairment Nursing Diagnoses: Impaired tissue integrity Knowledge deficit related to ulceration/compromised skin integrity Goals: Ulcer/skin breakdown will have a volume reduction of 80% by week 12 Date Initiated: 09/21/2017 Target Resolution Date: 04/14/2018 Goal Status: Active Interventions: Assess patient/caregiver ability to perform ulcer/skin care regimen upon admission and as needed Assess ulceration(s) every visit WHITNEY, BINGAMAN ([id number]) Notes: Electronic Signature(s) Signed: 01/08/2018 4:59:33 PM By: Alric Quan Entered By: Alric Quan on 01/04/2018 11:16:59 [name] ([id number]) -------------------------------------------------------------------------------- Pain Assessment Details Patient Name: Bradley Loa A. Date of Service: 01/04/2018 10:30 AM Medical Record Number:  [id number] Patient Account Number: [id number] Date of Birth/Sex: 1946/05/16 (72 y.o. M) Treating RN: Cornell Barman Primary Care Lynze Reddy: [name] Other Clinician: Referring  Evamae Rowen: [name] Treating Teddi Badalamenti/Extender: Cathie Olden in Treatment: 15 Active Problems Location of Pain Severity and Description of Pain Patient Has Paino Yes Site Locations Pain Location: Generalized Pain Pain Management and Medication Current Pain Management: Goals for Pain Management Pateint states he hurts somewhere all the time. Electronic Signature(s) Signed: 01/05/2018 6:17:48 PM By: Gretta Cool, BSN, RN, CWS, Kim RN, BSN Entered By: Gretta Cool, BSN, RN, CWS, Kim on 01/04/2018 10:50:36 [name] ([id number]) -------------------------------------------------------------------------------- Patient/Caregiver Education Details Patient Name: DEARION, HUOT A. Date of Service: 01/04/2018 10:30 AM Medical Record Number: [id number] Patient Account Number: [id number] Date of Birth/Gender: 1945/12/29 (72 y.o. M) Treating RN: Roger Shelter Primary Care Physician: [name] Other Clinician: Referring Physician: [name] Treating Physician/Extender: Cathie Olden in Treatment: 15 Education Assessment Education Provided To: Patient Education Topics Provided Wound Debridement: Handouts: Wound Debridement Methods: Explain/Verbal Responses: State content correctly Wound/Skin Impairment: Handouts: Caring for Your Ulcer Methods: Explain/Verbal Responses: State content correctly Electronic Signature(s) Signed: 01/05/2018 4:54:12 PM By: Roger Shelter Entered By: Roger Shelter on 01/04/2018 11:48:12 [name] ([id number]) -------------------------------------------------------------------------------- Wound Assessment Details Patient Name: Bradley Loa A. Date of Service: 01/04/2018 10:30 AM Medical Record Number: [id number] Patient Account Number: [id number] Date of Birth/Sex: 04-Apr-1946 (72 y.o. M) Treating RN: Cornell Barman Primary Care Leelyn Jasinski: [name] Other Clinician: Referring Jheri Mitter: [name] Treating  Bronson Bressman/Extender: Lawanda Cousins Weeks in Treatment: 15 Wound Status Wound Number: 9 Primary Pressure Ulcer Etiology: Wound Location: Coccyx - Midline Wound Open Wounding Event: Pressure Injury Status: Date Acquired: 06/06/2014 Comorbid Anemia, Hypertension, History of pressure Weeks Of Treatment: 15 History: wounds, Rheumatoid Arthritis, Paraplegia Clustered Wound: No Photos Photo Uploaded By: Gretta Cool, BSN, RN, CWS, Kim on 01/05/2018 18:09:18 Wound Measurements Length: (cm) 13 % Reducti Width: (cm) 9 % Reducti Depth: (cm) 0.9 Epithelia Area: (cm) 91.892 Undermin Volume: (cm) 82.702 Locat Sta End Max Locati Sta End Max on in Area: -12% on in Volume: 77.6% lization: None ing: Yes ion 1 rting Position (o'clock): 2 ing Position (o'clock): 4 imum Distance: (cm) 0.5 on 2 rting Position (o'clock): 7 ing Position (o'clock): 8 imum Distance: (cm) 0.4 Wound Description Classification: Category/Stage IV Foul Odor Wound Margin: Epibole Slough/Fi Exudate Amount: Large Exudate Type: Serous Exudate Color: amber After Cleansing: No brino Yes Wound Bed Granulation Amount: Large (67-100%) Exposed Structure GABERIEL, YOUNGBLOOD A. ([id number]) Granulation Quality: Red, Hyper-granulation Fascia Exposed: No Necrotic Amount: Small (1-33%) Fat Layer (Subcutaneous Tissue) Exposed: Yes Necrotic Quality: Adherent Slough Tendon Exposed: No Muscle Exposed: Yes Necrosis of Muscle: No Joint Exposed: No Bone Exposed: No Periwound Skin Texture Texture Color No Abnormalities Noted: No No Abnormalities Noted: No Callus: No Atrophie Blanche: No Crepitus: No Cyanosis: No Excoriation: No Ecchymosis: No Induration: No Erythema: No Rash: No Hemosiderin Staining: Yes Scarring: Yes Mottled: No Pallor: No Moisture Rubor: No No Abnormalities Noted: No Dry / Scaly: No Temperature / Pain Maceration: Yes Temperature: No Abnormality Wound Preparation Ulcer Cleansing: Rinsed/Irrigated  with Saline Topical Anesthetic Applied: Other: Lidocaine 4%, Treatment Notes Wound #9 (Midline Coccyx) 1. Cleansed with: Clean wound with Normal Saline 2. Anesthetic Topical Lidocaine 4% cream to wound bed prior to debridement 4. Dressing Applied: Other dressing (specify in notes) Notes dakins/saline 50/50 mix soaked gauze packing with dry gauze on top cover with abd and xtrasorb and tape Electronic Signature(s) Signed: 01/05/2018 6:17:48 PM By: Gretta Cool, BSN, RN, CWS,  Maudie Mercury RN, BSN Entered By: Gretta Cool, BSN, RN, CWS, Kim on 01/04/2018 11:00:46 [name] ([id number]) -------------------------------------------------------------------------------- [place] Details Patient Name: [name] Date of Service: 01/04/2018 10:30 AM Medical Record Number: [id number] Patient Account Number: [id number] Date of Birth/Sex: 11-Apr-1946 (72 y.o. M) Treating RN: Cornell Barman Primary Care Elyse Prevo: [name] Other Clinician: Referring Bennie Scaff: [name] Treating Antwonette Feliz/Extender: Cathie Olden in Treatment: 15 Vital Signs Time Taken: 10:51 Temperature (F): 98.9 Unable to obtain height and weight: Medical Reason Pulse (bpm): 84 Respiratory Rate (breaths/min): 16 Blood Pressure (mmHg): 112/52 Reference Range: 80 - 120 mg / dl Electronic Signature(s) Signed: 01/05/2018 6:17:48 PM By: Gretta Cool, BSN, RN, CWS, Kim RN, BSN Entered By: Gretta Cool, BSN, RN, CWS, Kim on 01/04/2018 10:52:45

## 2018-01-19 DIAGNOSIS — M069 Rheumatoid arthritis, unspecified: Secondary | ICD-10-CM | POA: Diagnosis not present

## 2018-01-19 DIAGNOSIS — G8220 Paraplegia, unspecified: Secondary | ICD-10-CM | POA: Diagnosis not present

## 2018-01-19 DIAGNOSIS — N401 Enlarged prostate with lower urinary tract symptoms: Secondary | ICD-10-CM | POA: Diagnosis not present

## 2018-01-19 DIAGNOSIS — L89154 Pressure ulcer of sacral region, stage 4: Secondary | ICD-10-CM | POA: Diagnosis not present

## 2018-01-19 DIAGNOSIS — Z933 Colostomy status: Secondary | ICD-10-CM | POA: Diagnosis not present

## 2018-01-19 DIAGNOSIS — D649 Anemia, unspecified: Secondary | ICD-10-CM | POA: Diagnosis not present

## 2018-01-19 DIAGNOSIS — Z4800 Encounter for change or removal of nonsurgical wound dressing: Secondary | ICD-10-CM | POA: Diagnosis not present

## 2018-01-19 DIAGNOSIS — I10 Essential (primary) hypertension: Secondary | ICD-10-CM | POA: Diagnosis not present

## 2018-01-19 DIAGNOSIS — N319 Neuromuscular dysfunction of bladder, unspecified: Secondary | ICD-10-CM | POA: Diagnosis not present

## 2018-01-22 DIAGNOSIS — I10 Essential (primary) hypertension: Secondary | ICD-10-CM | POA: Diagnosis not present

## 2018-01-22 DIAGNOSIS — Z4800 Encounter for change or removal of nonsurgical wound dressing: Secondary | ICD-10-CM | POA: Diagnosis not present

## 2018-01-22 DIAGNOSIS — G8220 Paraplegia, unspecified: Secondary | ICD-10-CM | POA: Diagnosis not present

## 2018-01-22 DIAGNOSIS — S31000D Unspecified open wound of lower back and pelvis without penetration into retroperitoneum, subsequent encounter: Secondary | ICD-10-CM | POA: Diagnosis not present

## 2018-01-22 DIAGNOSIS — L89154 Pressure ulcer of sacral region, stage 4: Secondary | ICD-10-CM | POA: Diagnosis not present

## 2018-01-22 DIAGNOSIS — N319 Neuromuscular dysfunction of bladder, unspecified: Secondary | ICD-10-CM | POA: Diagnosis not present

## 2018-01-22 DIAGNOSIS — Z933 Colostomy status: Secondary | ICD-10-CM | POA: Diagnosis not present

## 2018-01-22 DIAGNOSIS — D649 Anemia, unspecified: Secondary | ICD-10-CM | POA: Diagnosis not present

## 2018-01-22 DIAGNOSIS — N401 Enlarged prostate with lower urinary tract symptoms: Secondary | ICD-10-CM | POA: Diagnosis not present

## 2018-01-22 DIAGNOSIS — M069 Rheumatoid arthritis, unspecified: Secondary | ICD-10-CM | POA: Diagnosis not present

## 2018-01-24 DIAGNOSIS — Z4800 Encounter for change or removal of nonsurgical wound dressing: Secondary | ICD-10-CM | POA: Diagnosis not present

## 2018-01-24 DIAGNOSIS — D649 Anemia, unspecified: Secondary | ICD-10-CM | POA: Diagnosis not present

## 2018-01-24 DIAGNOSIS — I10 Essential (primary) hypertension: Secondary | ICD-10-CM | POA: Diagnosis not present

## 2018-01-24 DIAGNOSIS — N319 Neuromuscular dysfunction of bladder, unspecified: Secondary | ICD-10-CM | POA: Diagnosis not present

## 2018-01-24 DIAGNOSIS — M069 Rheumatoid arthritis, unspecified: Secondary | ICD-10-CM | POA: Diagnosis not present

## 2018-01-24 DIAGNOSIS — N401 Enlarged prostate with lower urinary tract symptoms: Secondary | ICD-10-CM | POA: Diagnosis not present

## 2018-01-24 DIAGNOSIS — G8220 Paraplegia, unspecified: Secondary | ICD-10-CM | POA: Diagnosis not present

## 2018-01-24 DIAGNOSIS — L89154 Pressure ulcer of sacral region, stage 4: Secondary | ICD-10-CM | POA: Diagnosis not present

## 2018-01-24 DIAGNOSIS — Z933 Colostomy status: Secondary | ICD-10-CM | POA: Diagnosis not present

## 2018-01-25 ENCOUNTER — Encounter: Payer: Medicare HMO | Admitting: Nurse Practitioner

## 2018-01-25 DIAGNOSIS — M069 Rheumatoid arthritis, unspecified: Secondary | ICD-10-CM | POA: Diagnosis not present

## 2018-01-25 DIAGNOSIS — G8220 Paraplegia, unspecified: Secondary | ICD-10-CM | POA: Diagnosis not present

## 2018-01-25 DIAGNOSIS — I10 Essential (primary) hypertension: Secondary | ICD-10-CM | POA: Diagnosis not present

## 2018-01-25 DIAGNOSIS — L89154 Pressure ulcer of sacral region, stage 4: Secondary | ICD-10-CM | POA: Diagnosis not present

## 2018-01-26 DIAGNOSIS — N401 Enlarged prostate with lower urinary tract symptoms: Secondary | ICD-10-CM | POA: Diagnosis not present

## 2018-01-26 DIAGNOSIS — N319 Neuromuscular dysfunction of bladder, unspecified: Secondary | ICD-10-CM | POA: Diagnosis not present

## 2018-01-26 DIAGNOSIS — D649 Anemia, unspecified: Secondary | ICD-10-CM | POA: Diagnosis not present

## 2018-01-26 DIAGNOSIS — L89154 Pressure ulcer of sacral region, stage 4: Secondary | ICD-10-CM | POA: Diagnosis not present

## 2018-01-26 DIAGNOSIS — Z4800 Encounter for change or removal of nonsurgical wound dressing: Secondary | ICD-10-CM | POA: Diagnosis not present

## 2018-01-26 DIAGNOSIS — I10 Essential (primary) hypertension: Secondary | ICD-10-CM | POA: Diagnosis not present

## 2018-01-26 DIAGNOSIS — G8220 Paraplegia, unspecified: Secondary | ICD-10-CM | POA: Diagnosis not present

## 2018-01-26 DIAGNOSIS — Z933 Colostomy status: Secondary | ICD-10-CM | POA: Diagnosis not present

## 2018-01-26 DIAGNOSIS — M069 Rheumatoid arthritis, unspecified: Secondary | ICD-10-CM | POA: Diagnosis not present

## 2018-01-29 DIAGNOSIS — M069 Rheumatoid arthritis, unspecified: Secondary | ICD-10-CM | POA: Diagnosis not present

## 2018-01-29 DIAGNOSIS — D649 Anemia, unspecified: Secondary | ICD-10-CM | POA: Diagnosis not present

## 2018-01-29 DIAGNOSIS — N319 Neuromuscular dysfunction of bladder, unspecified: Secondary | ICD-10-CM | POA: Diagnosis not present

## 2018-01-29 DIAGNOSIS — Z933 Colostomy status: Secondary | ICD-10-CM | POA: Diagnosis not present

## 2018-01-29 DIAGNOSIS — N401 Enlarged prostate with lower urinary tract symptoms: Secondary | ICD-10-CM | POA: Diagnosis not present

## 2018-01-29 DIAGNOSIS — I10 Essential (primary) hypertension: Secondary | ICD-10-CM | POA: Diagnosis not present

## 2018-01-29 DIAGNOSIS — L89154 Pressure ulcer of sacral region, stage 4: Secondary | ICD-10-CM | POA: Diagnosis not present

## 2018-01-29 DIAGNOSIS — Z4800 Encounter for change or removal of nonsurgical wound dressing: Secondary | ICD-10-CM | POA: Diagnosis not present

## 2018-01-29 DIAGNOSIS — G8220 Paraplegia, unspecified: Secondary | ICD-10-CM | POA: Diagnosis not present

## 2018-01-31 DIAGNOSIS — D649 Anemia, unspecified: Secondary | ICD-10-CM | POA: Diagnosis not present

## 2018-01-31 DIAGNOSIS — M069 Rheumatoid arthritis, unspecified: Secondary | ICD-10-CM | POA: Diagnosis not present

## 2018-01-31 DIAGNOSIS — I10 Essential (primary) hypertension: Secondary | ICD-10-CM | POA: Diagnosis not present

## 2018-01-31 DIAGNOSIS — G8220 Paraplegia, unspecified: Secondary | ICD-10-CM | POA: Diagnosis not present

## 2018-01-31 DIAGNOSIS — N319 Neuromuscular dysfunction of bladder, unspecified: Secondary | ICD-10-CM | POA: Diagnosis not present

## 2018-01-31 DIAGNOSIS — L89154 Pressure ulcer of sacral region, stage 4: Secondary | ICD-10-CM | POA: Diagnosis not present

## 2018-01-31 DIAGNOSIS — Z4800 Encounter for change or removal of nonsurgical wound dressing: Secondary | ICD-10-CM | POA: Diagnosis not present

## 2018-01-31 DIAGNOSIS — Z933 Colostomy status: Secondary | ICD-10-CM | POA: Diagnosis not present

## 2018-01-31 DIAGNOSIS — N401 Enlarged prostate with lower urinary tract symptoms: Secondary | ICD-10-CM | POA: Diagnosis not present

## 2018-02-01 DIAGNOSIS — L89154 Pressure ulcer of sacral region, stage 4: Secondary | ICD-10-CM | POA: Diagnosis not present

## 2018-02-01 DIAGNOSIS — Z4800 Encounter for change or removal of nonsurgical wound dressing: Secondary | ICD-10-CM | POA: Diagnosis not present

## 2018-02-01 DIAGNOSIS — D649 Anemia, unspecified: Secondary | ICD-10-CM | POA: Diagnosis not present

## 2018-02-01 DIAGNOSIS — I10 Essential (primary) hypertension: Secondary | ICD-10-CM | POA: Diagnosis not present

## 2018-02-01 DIAGNOSIS — N319 Neuromuscular dysfunction of bladder, unspecified: Secondary | ICD-10-CM | POA: Diagnosis not present

## 2018-02-01 DIAGNOSIS — N401 Enlarged prostate with lower urinary tract symptoms: Secondary | ICD-10-CM | POA: Diagnosis not present

## 2018-02-01 DIAGNOSIS — Z933 Colostomy status: Secondary | ICD-10-CM | POA: Diagnosis not present

## 2018-02-01 DIAGNOSIS — G8220 Paraplegia, unspecified: Secondary | ICD-10-CM | POA: Diagnosis not present

## 2018-02-01 DIAGNOSIS — M069 Rheumatoid arthritis, unspecified: Secondary | ICD-10-CM | POA: Diagnosis not present

## 2018-02-02 DIAGNOSIS — I10 Essential (primary) hypertension: Secondary | ICD-10-CM | POA: Diagnosis not present

## 2018-02-02 DIAGNOSIS — G8220 Paraplegia, unspecified: Secondary | ICD-10-CM | POA: Diagnosis not present

## 2018-02-02 DIAGNOSIS — Z933 Colostomy status: Secondary | ICD-10-CM | POA: Diagnosis not present

## 2018-02-02 DIAGNOSIS — L89154 Pressure ulcer of sacral region, stage 4: Secondary | ICD-10-CM | POA: Diagnosis not present

## 2018-02-02 DIAGNOSIS — D649 Anemia, unspecified: Secondary | ICD-10-CM | POA: Diagnosis not present

## 2018-02-02 DIAGNOSIS — Z4800 Encounter for change or removal of nonsurgical wound dressing: Secondary | ICD-10-CM | POA: Diagnosis not present

## 2018-02-02 DIAGNOSIS — N401 Enlarged prostate with lower urinary tract symptoms: Secondary | ICD-10-CM | POA: Diagnosis not present

## 2018-02-02 DIAGNOSIS — M069 Rheumatoid arthritis, unspecified: Secondary | ICD-10-CM | POA: Diagnosis not present

## 2018-02-02 DIAGNOSIS — N319 Neuromuscular dysfunction of bladder, unspecified: Secondary | ICD-10-CM | POA: Diagnosis not present

## 2018-02-03 NOTE — Progress Notes (Signed)
ROEY, COOPMAN ([id number]) Visit Report for 01/25/2018 Arrival Information Details Patient Name: Bradley [name], Bradley [name]. Date of Service: 01/25/2018 10:30 AM Medical Record Number: [id number] Patient Account Number: [id number] Date of Birth/Sex: 03-15-1946 (72 y.o. M) Treating RN: Montey Hora Primary Care Elkin Belfield: [name] Other Clinician: Referring Rishaan Gunner: [name] Treating Mireya Meditz/Extender: Cathie Olden in Treatment: 18 Visit Information History Since Last Visit Added or deleted any medications: No Patient Arrived: Wheel Chair Any new allergies or adverse reactions: No Arrival Time: 10:37 Had a fall or experienced change in No Accompanied By: self activities of daily living that may affect Transfer Assistance: Harrel Lemon Lift risk of falls: Patient Identification Verified: Yes Signs or symptoms of abuse/neglect since last visito No Secondary Verification Process Completed: Yes Hospitalized since last visit: No Implantable device outside of the clinic excluding No cellular tissue based products placed in the center since last visit: Has Dressing in Place as Prescribed: Yes Pain Present Now: No Electronic Signature(s) Signed: 01/26/2018 4:18:40 PM By: Montey Hora Entered By: Montey Hora on 01/25/2018 10:37:48 [name] ([id number]) -------------------------------------------------------------------------------- Lower Extremity Assessment Details Patient Name: Bradley Loa A. Date of Service: 01/25/2018 10:30 AM Medical Record Number: [id number] Patient Account Number: [id number] Date of Birth/Sex: 01/06/1946 (72 y.o. M) Treating RN: Montey Hora Primary Care Maleiah Dula: [name] Other Clinician: Referring Yiselle Babich: [name] Treating Lyra Alaimo/Extender: Lawanda Cousins Weeks in Treatment: 18 Electronic Signature(s) Signed: 01/26/2018 4:18:40 PM By: Montey Hora Entered By: Montey Hora on 01/25/2018 10:46:40 [name] ([id number]) -------------------------------------------------------------------------------- Multi Wound Chart Details Patient Name: Bradley [name], Bradley A. Date of Service: 01/25/2018 10:30 AM Medical Record Number: [id number] Patient Account Number: [id number] Date of Birth/Sex: [birth year] (72 y.o. M) Treating RN: Ahmed Prima Primary Care Jakaiya Netherland: [name] Other Clinician: Referring Jamie-Lee Galdamez: [name] Treating Laiden Milles/Extender: Cathie Olden in Treatment: 18 Vital Signs Height(in): Pulse(bpm): 24 Weight(lbs): Blood Pressure(mmHg): 90/68 Body Mass Index(BMI): Temperature(F): 98.4 Respiratory Rate 16 (breaths/min): Photos: [9:No Photos] [N/A:N/A] Wound Location: [9:Coccyx - Midline] [N/A:N/A] Wounding Event: [9:Pressure Injury] [N/A:N/A] Primary Etiology: [9:Pressure Ulcer] [N/A:N/A] Comorbid History: [9:Anemia, Hypertension, History of pressure wounds, Rheumatoid Arthritis, Paraplegia] [N/A:N/A] Date Acquired: [9:06/06/2014] [N/A:N/A] Weeks of Treatment: [9:18] [N/A:N/A] Wound Status: [9:Open] [N/A:N/A] Measurements L x W x D [9:12.3x7.8x0.9] [N/A:N/A] (cm) Area (cm) : [9:75.351] [N/A:N/A] Volume (cm) : [9:67.816] [N/A:N/A] % Reduction in Area: [9:8.20%] [N/A:N/A] % Reduction in Volume: [9:81.60%] [N/A:N/A] Position 1 (o'clock): [9:12] Maximum Distance 1 (cm): [9:4] Starting Position 1 [9:6] (o'clock): Ending Position 1 [9:10] (o'clock): Maximum Distance 1 (cm): [9:0.8] Tunneling: [9:Yes] [N/A:N/A] Undermining: [9:Yes] [N/A:N/A] Classification: [9:Category/Stage IV] [N/A:N/A] Exudate Amount: [9:Large] [N/A:N/A] Exudate Type: [9:Serous] [N/A:N/A] Exudate Color: [9:amber] [N/A:N/A] Wound Margin: [9:Epibole] [N/A:N/A] Granulation Amount: [9:Large (67-100%)] [N/A:N/A] Granulation Quality: [9:Red, Hyper-granulation] [N/A:N/A] Necrotic Amount: [9:Small (1-33%)] [N/A:N/A] Exposed Structures: [9:Fat Layer (Subcutaneous Tissue) Exposed:  Yes Muscle: Yes] [N/A:N/A] Fascia: No Tendon: No Joint: No Bone: No Epithelialization: None N/A N/A Debridement: Debridement - Excisional N/A N/A Pre-procedure 10:57 N/A N/A Verification/Time Out Taken: Pain Control: Lidocaine 4% Topical Solution N/A N/A Tissue Debrided: Subcutaneous, Slough N/A N/A Level: Skin/Subcutaneous Tissue N/A N/A Debridement Area (sq cm): 95.94 N/A N/A Instrument: Curette N/A N/A Bleeding: Minimum N/A N/A Hemostasis Achieved: Pressure N/A N/A Procedural Pain: 0 N/A N/A Post Procedural Pain: 0 N/A N/A Debridement Treatment Procedure was tolerated well N/A N/A Response: Post Debridement 12.3x7.8x1 N/A N/A Measurements L x W x D (cm) Post Debridement Volume: 75.351 N/A N/A (cm) Post Debridement Stage: Category/Stage IV N/A N/A Periwound Skin Texture: Scarring: Yes N/A  N/A Excoriation: No Induration: No Callus: No Crepitus: No Rash: No Periwound Skin Moisture: Maceration: Yes N/A N/A Dry/Scaly: No Periwound Skin Color: Hemosiderin Staining: Yes N/A N/A Atrophie Blanche: No Cyanosis: No Ecchymosis: No Erythema: No Mottled: No Pallor: No Rubor: No Temperature: No Abnormality N/A N/A Tenderness on Palpation: No N/A N/A Wound Preparation: Ulcer Cleansing: N/A N/A Rinsed/Irrigated with Saline Topical Anesthetic Applied: Other: Lidocaine 4% Procedures Performed: Debridement N/A N/A Treatment Notes Electronic Signature(s) Signed: 01/25/2018 11:12:34 AM By: Lawanda Cousins Entered By: Lawanda Cousins on 01/25/2018 11:12:33 Bradley [name], Bradley [name] ([id number]) Bradley [name], Bradley [name] ([id number]) -------------------------------------------------------------------------------- [place] Details Patient Name: Bradley [name], Bradley A. Date of Service: 01/25/2018 10:30 AM Medical Record Number: [id number] Patient Account Number: [id number] Date of Birth/Sex: [birth year] (72 y.o. M) Treating RN: Ahmed Prima Primary Care Moataz Tavis: [name]  Other Clinician: Referring Damonique Brunelle: [name] Treating Thereasa Iannello/Extender: Cathie Olden in Treatment: 4 Active Inactive ` Abuse / Safety / Falls / Self Care Management Nursing Diagnoses: Potential for falls Goals: Patient will not experience any injury related to falls Date Initiated: 09/21/2017 Target Resolution Date: 01/13/2018 Goal Status: Active Interventions: Assess Activities of Daily Living upon admission and as needed Assess fall risk on admission and as needed Assess: immobility, friction, shearing, incontinence upon admission and as needed Assess impairment of mobility on admission and as needed per policy Assess personal safety and home safety (as indicated) on admission and as needed Assess self care needs on admission and as needed Notes: ` Nutrition Nursing Diagnoses: Imbalanced nutrition Potential for alteratiion in Nutrition/Potential for imbalanced nutrition Goals: Patient/caregiver agrees to and verbalizes understanding of need to use nutritional supplements and/or vitamins as prescribed Date Initiated: 09/21/2017 Target Resolution Date: 01/13/2018 Goal Status: Active Interventions: Assess patient nutrition upon admission and as needed per policy Notes: ` Orientation to the Wound Care Program Nursing Diagnoses: Knowledge deficit related to the wound healing center program Bradley [name], Bradley [name] ([id number]) Goals: Patient/caregiver will verbalize understanding of the [place] Date Initiated: 09/21/2017 Target Resolution Date: 10/14/2017 Goal Status: Active Interventions: Provide education on orientation to the wound center Notes: ` Pressure Nursing Diagnoses: Knowledge deficit related to causes and risk factors for pressure ulcer development Knowledge deficit related to management of pressures ulcers Potential for impaired tissue integrity related to pressure, friction, moisture, and shear Goals: Patient will remain free  from development of additional pressure ulcers Date Initiated: 09/21/2017 Target Resolution Date: 01/13/2018 Goal Status: Active Patient/caregiver will verbalize risk factors for pressure ulcer development Date Initiated: 09/21/2017 Target Resolution Date: 02/10/2018 Goal Status: Active Patient/caregiver will verbalize understanding of pressure ulcer management Date Initiated: 09/21/2017 Target Resolution Date: 12/09/2017 Goal Status: Active Interventions: Assess: immobility, friction, shearing, incontinence upon admission and as needed Assess offloading mechanisms upon admission and as needed Assess potential for pressure ulcer upon admission and as needed Provide education on pressure ulcers Notes: ` Wound/Skin Impairment Nursing Diagnoses: Impaired tissue integrity Knowledge deficit related to ulceration/compromised skin integrity Goals: Ulcer/skin breakdown will have a volume reduction of 80% by week 12 Date Initiated: 09/21/2017 Target Resolution Date: 04/14/2018 Goal Status: Active Interventions: Assess patient/caregiver ability to perform ulcer/skin care regimen upon admission and as needed Assess ulceration(s) every visit Bradley [name], Bradley [name] ([id number]) Notes: Electronic Signature(s) Signed: 01/26/2018 3:54:45 PM By: Alric Quan Entered By: Alric Quan on 01/25/2018 10:56:11 [name] ([id number]) -------------------------------------------------------------------------------- Pain Assessment Details Patient Name: Bradley Loa A. Date of Service: 01/25/2018 10:30 AM Medical Record Number: [id number] Patient Account Number: [id number] Date of Birth/Sex: Sep 14, 1945 (72  y.o. M) Treating RN: Montey Hora Primary Care Cordell Guercio: [name] Other Clinician: Referring Macen Joslin: [name] Treating Mikia Delaluz/Extender: Cathie Olden in Treatment: 21 Active Problems Location of Pain Severity and Description of Pain Patient Has Paino No Site  Locations Pain Management and Medication Current Pain Management: Electronic Signature(s) Signed: 01/26/2018 4:18:40 PM By: Montey Hora Entered By: Montey Hora on 01/25/2018 10:37:55 [name] ([id number]) -------------------------------------------------------------------------------- Wound Assessment Details Patient Name: Bradley Loa A. Date of Service: 01/25/2018 10:30 AM Medical Record Number: [id number] Patient Account Number: [id number] Date of Birth/Sex: June 24, 1945 (72 y.o. M) Treating RN: Montey Hora Primary Care Khyle Goodell: [name] Other Clinician: Referring Vasiliki Smaldone: [name] Treating Kayl Stogdill/Extender: Cathie Olden in Treatment: 18 Wound Status Wound Number: 9 Primary Pressure Ulcer Etiology: Wound Location: Coccyx - Midline Wound Open Wounding Event: Pressure Injury Status: Date Acquired: 06/06/2014 Comorbid Anemia, Hypertension, History of pressure Weeks Of Treatment: 18 History: wounds, Rheumatoid Arthritis, Paraplegia Clustered Wound: No Photos Photo Uploaded By: Montey Hora on 01/25/2018 13:52:47 Wound Measurements Length: (cm) 12.3 Width: (cm) 7.8 Depth: (cm) 0.9 Area: (cm) 75.351 Volume: (cm) 67.816 % Reduction in Area: 8.2% % Reduction in Volume: 81.6% Epithelialization: None Tunneling: Yes Position (o'clock): 12 Maximum Distance: (cm) 4 Undermining: Yes Starting Position (o'clock): 6 Ending Position (o'clock): 10 Maximum Distance: (cm) 0.8 Wound Description Classification: Category/Stage IV Wound Margin: Epibole Exudate Amount: Large Exudate Type: Serous Exudate Color: amber Foul Odor After Cleansing: No Slough/Fibrino Yes Wound Bed Granulation Amount: Large (67-100%) Exposed Structure Granulation Quality: Red, Hyper-granulation Fascia Exposed: No Bradley [name], Bradley A. ([id number]) Necrotic Amount: Small (1-33%) Fat Layer (Subcutaneous Tissue) Exposed: Yes Necrotic Quality: Adherent Slough Tendon  Exposed: No Muscle Exposed: Yes Necrosis of Muscle: No Joint Exposed: No Bone Exposed: No Periwound Skin Texture Texture Color No Abnormalities Noted: No No Abnormalities Noted: No Callus: No Atrophie Blanche: No Crepitus: No Cyanosis: No Excoriation: No Ecchymosis: No Induration: No Erythema: No Rash: No Hemosiderin Staining: Yes Scarring: Yes Mottled: No Pallor: No Moisture Rubor: No No Abnormalities Noted: No Dry / Scaly: No Temperature / Pain Maceration: Yes Temperature: No Abnormality Wound Preparation Ulcer Cleansing: Rinsed/Irrigated with Saline Topical Anesthetic Applied: Other: Lidocaine 4%, Electronic Signature(s) Signed: 01/26/2018 4:18:40 PM By: Montey Hora Entered By: Montey Hora on 01/25/2018 10:50:01 [name] ([id number]) -------------------------------------------------------------------------------- Vitals Details Patient Name: Bradley Loa A. Date of Service: 01/25/2018 10:30 AM Medical Record Number: [id number] Patient Account Number: [id number] Date of Birth/Sex: 02/02/1946 (72 y.o. M) Treating RN: Montey Hora Primary Care Princella Jaskiewicz: [name] Other Clinician: Referring Betty Brooks: [name] Treating Dona Klemann/Extender: Cathie Olden in Treatment: 18 Vital Signs Time Taken: 10:37 Temperature (F): 98.4 Pulse (bpm): 69 Respiratory Rate (breaths/min): 16 Blood Pressure (mmHg): 90/68 Reference Range: 80 - 120 mg / dl Electronic Signature(s) Signed: 01/26/2018 4:18:40 PM By: Montey Hora Entered By: Montey Hora on 01/25/2018 10:39:52

## 2018-02-07 DIAGNOSIS — I10 Essential (primary) hypertension: Secondary | ICD-10-CM | POA: Diagnosis not present

## 2018-02-07 DIAGNOSIS — N319 Neuromuscular dysfunction of bladder, unspecified: Secondary | ICD-10-CM | POA: Diagnosis not present

## 2018-02-07 DIAGNOSIS — L89154 Pressure ulcer of sacral region, stage 4: Secondary | ICD-10-CM | POA: Diagnosis not present

## 2018-02-07 DIAGNOSIS — N401 Enlarged prostate with lower urinary tract symptoms: Secondary | ICD-10-CM | POA: Diagnosis not present

## 2018-02-07 DIAGNOSIS — G8220 Paraplegia, unspecified: Secondary | ICD-10-CM | POA: Diagnosis not present

## 2018-02-07 DIAGNOSIS — Z933 Colostomy status: Secondary | ICD-10-CM | POA: Diagnosis not present

## 2018-02-07 DIAGNOSIS — Z4800 Encounter for change or removal of nonsurgical wound dressing: Secondary | ICD-10-CM | POA: Diagnosis not present

## 2018-02-07 DIAGNOSIS — D649 Anemia, unspecified: Secondary | ICD-10-CM | POA: Diagnosis not present

## 2018-02-07 DIAGNOSIS — M069 Rheumatoid arthritis, unspecified: Secondary | ICD-10-CM | POA: Diagnosis not present

## 2018-02-07 NOTE — Progress Notes (Signed)
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 attempted  flap closure. Although I do not have current access to her note in care everywhere this is likely due to exposed bone. Again according to the patient they did [name] bone biopsy. He is still using [name] variant of Dakin solution changing twice [name] day. He has home health. The patient is not able to give me [name] firm answer about how long he spends on this in his wheelchair The patient also states that Dr. Myriam Jacobson wanted to reconsider [name] wound VAC. I really don't see this as [name] viable option at least not in the outpatient setting. The wound itself is frankly to close to his anal opening to maintain [name] seal. The last time we tried to do this home health was unable to manage it. It might be possible to maintain [name] wound VAC in this setting outside of the home such as [name] skilled nursing facility or an [place] however I am doubtful about this even in that setting **** READMISSION 09/21/17-He is here for evaluation of stage IV sacral ulcer. Since his last evaluation here in December he has completed treatment for sacral osteomyelitis. He was at [facility] for IV therapy and NPWT dressing changes. He was discharged, with home health services, in February. He admits that while in the skilled facility he had "80%" success with maintaining dressing, since discharge he has had approximately "40%" success with maintaining wound VAC dressing. We discussed at length that this is not [name] safe or recommended option. We will apply Dakin's wet to dry dressing daily and he will follow-up next week. He is accompanied today by  his sister who is willing to assist in dressing changes; they will discuss the social issue as he feels he is capable of changing dressing daily when home health is not able. 09/28/17-He is here in follow-up evaluation for stage IV sacral pressure ulcer. He has been using the Dakin's wet-to-dry daily; he continues with home health. He is not accompanied by anyone at this visit. He will follow up in two weeks per his request/preference. 10/12/17 on evaluation today patient appears to be doing very well. The Dakin solution went to dry packings do seem to be helping him as far as the sacral wound is concerned I'm not seeing anything that has me more concerned as far as infection or otherwise is concerned. Overall I'm pleased with the appearance of the wound. 10/26/17-He is here in follow up evaluation for [name] stage IV sacral ulcer. He continues with daily Dakin's wet-to-dry. He is voicing no complaints or concerns. He will follow-up in 2 weeks 11/16/17-He is here for follow up evaluation for [name] palliative stage IV sacral pressure ulcer. We will continue with Dakin's wet-to- dry. He will follow-up in 4 weeks. He is expressing concern/complaint regarding new bed that has arrived, stating he is unable to manipulate/maneuver it due to the bed crank being at the foot of the bed. 12/14/17-He is here for evaluation for palliative stage IV sacral ulcer. He is voicing no complaints or concerns. We will continue with one-to-one ratio of saline and dakins. He will follow-up in 3 weeks 01/04/18-He is here in follow-up evaluation for palliative stage IV sacral ulcer. He is inquiring about reinstating the negative pressure wound therapy. We discussed at length that the negative pressure wound therapy in the home setting has not been successful for him repeatedly with loss of cereal and unavailability of 24/7 help; reminded him that home health is not available 24/7 when loss of seal occurs. He does verbalize understanding  to  this and does not pursue. We also discussed the palliative nature of this ulcer (given no significant change/improvement in measurement/appearance, not [name] candidate for muscle flap per plastic surgery, and continued independent living) and that the goal is for maintenance, decrease in infection and minimizing/avoiding deterioration given that he is independent in his care, does not have home health and requires daily dressing changes secondary to drainage amount. He is inquiring about [name] wound clinic in [facility], I have informed him that I am unfamiliar with that clinic but that he is encouraged to seek another opinion if that is his desire. We will continue with dakins and he will follow up in three weeks 01/25/18-He is here in follow-up evaluation for palliative stage IV sacral ulcer. He continues with Dakin's/saline 1:1 mixture wet to dry dressing changes. He states he has an appointment at [facility] on 9/17 for evaluation of surgical intervention/closure of the sacral ulcer. He will follow-up here in 4 weeks Bradley [name], Bradley [name]. ([id number]) Objective Constitutional Vitals Time Taken: 10:37 AM, Temperature: 98.4 F, Pulse: 69 bpm, Respiratory Rate: 16 breaths/min, Blood Pressure: 90/68 mmHg. Integumentary (Hair, Skin) Wound #9 status is Open. Original cause of wound was Pressure Injury. The wound is located on the Midline Coccyx. The wound measures 12.3cm length x 7.8cm width x 0.9cm depth; 75.351cm^2 area and 67.816cm^3 volume. There is muscle and Fat Layer (Subcutaneous Tissue) Exposed exposed. Tunneling has been noted at 12:00 with [name] maximum distance of 4cm. Undermining begins at 6:00 and ends at 10:00 with [name] maximum distance of 0.8cm. There is [name] large amount of serous drainage noted. The wound margin is epibole. There is large (67-100%) red, hyper - granulation within the wound bed. There is [name] small (1-33%) amount of necrotic tissue within the wound bed including Adherent Slough. The periwound  skin appearance exhibited: Scarring, Maceration, Hemosiderin Staining. The periwound skin appearance did not exhibit: Callus, Crepitus, Excoriation, Induration, Rash, Dry/Scaly, Atrophie Blanche, Cyanosis, Ecchymosis, Mottled, Pallor, Rubor, Erythema. Periwound temperature was noted as No Abnormality. Assessment Active Problems ICD-10 Pressure ulcer of sacral region, stage 4 Paraplegia, unspecified Other chronic osteomyelitis, other site Procedures Wound #9 Pre-procedure diagnosis of Wound #9 is [name] Pressure Ulcer located on the Midline Coccyx . There was [name] Excisional Skin/Subcutaneous Tissue Debridement with [name] total area of 95.94 sq cm performed by Lawanda Cousins, NP. With the following instrument(s): Curette to remove Viable and Non-Viable tissue/material. Material removed includes Subcutaneous Tissue, Slough, and Fibrin/Exudate after achieving pain control using Lidocaine 4% Topical Solution. No specimens were taken. [name] time out was conducted at 10:57, prior to the start of the procedure. [name] Minimum amount of bleeding was controlled with Pressure. The procedure was tolerated well with [name] pain level of 0 throughout and [name] pain level of 0 following the procedure. Patient s Level of Consciousness post procedure was recorded as Awake and Alert. Post Debridement Measurements: 12.3cm length x 7.8cm width x 1cm depth; 75.351cm^3 volume. Post debridement Stage noted as Category/Stage IV. Character of Wound/Ulcer Post Debridement is stable. Post procedure Diagnosis Wound #9: Same as Pre-Procedure Plan Bradley [name], Bradley [name]. ([id number]) Wound Cleansing: Wound #9 Midline Coccyx: Clean wound with Normal Saline. Cleanse wound with mild soap and water Anesthetic (add to Medication List): Wound #9 Midline Coccyx: Topical Lidocaine 4% cream applied to wound bed prior to debridement (In Clinic Only). Skin Barriers/Peri-Wound Care: Wound #9 Midline Coccyx: Skin Prep Primary Wound Dressing: Wound #9 Midline  Coccyx: Other: - Dakins Solution mixed with normal saline 1:1 ratio moistened  gauze Secondary Dressing: Wound #9 Midline Coccyx: ABD pad Dry Gauze Other - medipore tape Roderic Palau Care Super absorbent dressing Dressing Change Frequency: Wound #9 Midline Coccyx: Other: - Three times daily Follow-up Appointments: Wound #9 Midline Coccyx: Return Appointment in 3 weeks. Off-Loading: Wound #9 Midline Coccyx: Mattress - hospital bed air fluidized mattress Turn and reposition every 2 hours Additional Orders / Instructions: Wound #9 Midline Coccyx: Vitamin [name]; Vitamin C, Zinc Increase protein intake. Home Health: Wound #9 Midline Coccyx: Continue Home Health Visits - [facility] Nurse may visit PRN to address patient s wound care needs. FACE TO FACE ENCOUNTER: MEDICARE and MEDICAID PATIENTS: I certify that this patient is under my care and that I had [name] face-to-face encounter that meets the physician face-to-face encounter requirements with this patient on this date. The encounter with the patient was in whole or in part for the following MEDICAL CONDITION: (primary reason for [place]) MEDICAL NECESSITY: I certify, that based on my findings, NURSING services are [name] medically necessary home health service. HOME BOUND STATUS: I certify that my clinical findings support that this patient is homebound (i.e., Due to illness or injury, pt requires aid of supportive devices such as crutches, cane, wheelchairs, walkers, the use of special transportation or the assistance of another person to leave their place of residence. There is [name] normal inability to leave the home and doing so requires considerable and taxing effort. Other absences are for medical reasons / religious services and are infrequent or of short duration when for other reasons). If current dressing causes regression in wound condition, may D/C ordered dressing product/s and apply Normal Saline Moist Dressing daily until next  [place] / Other MD appointment. [place] of regression in wound condition at [phone]. Please direct any NON-WOUND related issues/requests for orders to patient's Primary Care Physician The following medication(s) was prescribed: lidocaine topical 4 % cream 1 1 cream topical was prescribed at facility Electronic Signature(s) Signed: 01/25/2018 11:15:35 AM By: Gavin Pound ([id number]) Entered By: Lawanda Cousins on 01/25/2018 11:15:35 Bradley [name] ([id number]) -------------------------------------------------------------------------------- [place] Details Patient Name: Bradley Loa [name]. Date of Service: 01/25/2018 Medical Record Number: [id number] Patient Account Number: [id number] Date of Birth/Sex: 07/23/1945 (72 y.o. M) Treating RN: Ahmed Prima Primary Care Provider: [name] Other Clinician: Referring Provider: [name] Treating Provider/Extender: Cathie Olden in Treatment: 18 Diagnosis Coding ICD-10 Codes Code Description L89.154 Pressure ulcer of sacral region, stage 4 G82.20 Paraplegia, unspecified M86.68 Other chronic osteomyelitis, other site Facility Procedures CPT4 Code: 88891694 Description: 11042 - DEB SUBQ TISSUE 20 SQ CM/< ICD-10 Diagnosis Description L89.154 Pressure ulcer of sacral region, stage 4 Modifier: Quantity: 1 CPT4 Code: [id number] Description: 11045 - DEB SUBQ TISS EA ADDL 20CM ICD-10 Diagnosis Description L89.154 Pressure ulcer of sacral region, stage 4 Modifier: Quantity: 4 Physician Procedures CPT4 Code: [id number] Description: 11042 - WC PHYS SUBQ TISS 20 SQ CM ICD-10 Diagnosis Description L89.154 Pressure ulcer of sacral region, stage 4 Modifier: Quantity: 1 CPT4 Code: [id number] Description: 79480 - WC PHYS SUBQ TISS EA ADDL 20 CM ICD-10 Diagnosis Description L89.154 Pressure ulcer of sacral region, stage 4 Modifier: Quantity: 4 Electronic Signature(s) Signed:  01/25/2018 11:15:46 AM By: Lawanda Cousins Entered By: Lawanda Cousins on 01/25/2018 11:15:46

## 2018-02-09 DIAGNOSIS — I10 Essential (primary) hypertension: Secondary | ICD-10-CM | POA: Diagnosis not present

## 2018-02-09 DIAGNOSIS — Z933 Colostomy status: Secondary | ICD-10-CM | POA: Diagnosis not present

## 2018-02-09 DIAGNOSIS — Z4800 Encounter for change or removal of nonsurgical wound dressing: Secondary | ICD-10-CM | POA: Diagnosis not present

## 2018-02-09 DIAGNOSIS — G8220 Paraplegia, unspecified: Secondary | ICD-10-CM | POA: Diagnosis not present

## 2018-02-09 DIAGNOSIS — L89154 Pressure ulcer of sacral region, stage 4: Secondary | ICD-10-CM | POA: Diagnosis not present

## 2018-02-09 DIAGNOSIS — N319 Neuromuscular dysfunction of bladder, unspecified: Secondary | ICD-10-CM | POA: Diagnosis not present

## 2018-02-09 DIAGNOSIS — M069 Rheumatoid arthritis, unspecified: Secondary | ICD-10-CM | POA: Diagnosis not present

## 2018-02-09 DIAGNOSIS — D649 Anemia, unspecified: Secondary | ICD-10-CM | POA: Diagnosis not present

## 2018-02-09 DIAGNOSIS — N401 Enlarged prostate with lower urinary tract symptoms: Secondary | ICD-10-CM | POA: Diagnosis not present

## 2018-02-10 DIAGNOSIS — I10 Essential (primary) hypertension: Secondary | ICD-10-CM | POA: Diagnosis not present

## 2018-02-10 DIAGNOSIS — L89159 Pressure ulcer of sacral region, unspecified stage: Secondary | ICD-10-CM | POA: Diagnosis not present

## 2018-02-10 DIAGNOSIS — T8189XA Other complications of procedures, not elsewhere classified, initial encounter: Secondary | ICD-10-CM | POA: Diagnosis not present

## 2018-02-10 DIAGNOSIS — L89313 Pressure ulcer of right buttock, stage 3: Secondary | ICD-10-CM | POA: Diagnosis not present

## 2018-02-10 DIAGNOSIS — L89154 Pressure ulcer of sacral region, stage 4: Secondary | ICD-10-CM | POA: Diagnosis not present

## 2018-02-10 DIAGNOSIS — M6281 Muscle weakness (generalized): Secondary | ICD-10-CM | POA: Diagnosis not present

## 2018-02-10 DIAGNOSIS — G8220 Paraplegia, unspecified: Secondary | ICD-10-CM | POA: Diagnosis not present

## 2018-02-10 DIAGNOSIS — L89323 Pressure ulcer of left buttock, stage 3: Secondary | ICD-10-CM | POA: Diagnosis not present

## 2018-02-12 DIAGNOSIS — N319 Neuromuscular dysfunction of bladder, unspecified: Secondary | ICD-10-CM | POA: Diagnosis not present

## 2018-02-12 DIAGNOSIS — Z4800 Encounter for change or removal of nonsurgical wound dressing: Secondary | ICD-10-CM | POA: Diagnosis not present

## 2018-02-12 DIAGNOSIS — M069 Rheumatoid arthritis, unspecified: Secondary | ICD-10-CM | POA: Diagnosis not present

## 2018-02-12 DIAGNOSIS — I10 Essential (primary) hypertension: Secondary | ICD-10-CM | POA: Diagnosis not present

## 2018-02-12 DIAGNOSIS — G8220 Paraplegia, unspecified: Secondary | ICD-10-CM | POA: Diagnosis not present

## 2018-02-12 DIAGNOSIS — N401 Enlarged prostate with lower urinary tract symptoms: Secondary | ICD-10-CM | POA: Diagnosis not present

## 2018-02-12 DIAGNOSIS — Z933 Colostomy status: Secondary | ICD-10-CM | POA: Diagnosis not present

## 2018-02-12 DIAGNOSIS — L89154 Pressure ulcer of sacral region, stage 4: Secondary | ICD-10-CM | POA: Diagnosis not present

## 2018-02-12 DIAGNOSIS — D649 Anemia, unspecified: Secondary | ICD-10-CM | POA: Diagnosis not present

## 2018-02-14 DIAGNOSIS — N401 Enlarged prostate with lower urinary tract symptoms: Secondary | ICD-10-CM | POA: Diagnosis not present

## 2018-02-14 DIAGNOSIS — G8220 Paraplegia, unspecified: Secondary | ICD-10-CM | POA: Diagnosis not present

## 2018-02-14 DIAGNOSIS — Z933 Colostomy status: Secondary | ICD-10-CM | POA: Diagnosis not present

## 2018-02-14 DIAGNOSIS — I10 Essential (primary) hypertension: Secondary | ICD-10-CM | POA: Diagnosis not present

## 2018-02-14 DIAGNOSIS — L89154 Pressure ulcer of sacral region, stage 4: Secondary | ICD-10-CM | POA: Diagnosis not present

## 2018-02-14 DIAGNOSIS — N319 Neuromuscular dysfunction of bladder, unspecified: Secondary | ICD-10-CM | POA: Diagnosis not present

## 2018-02-14 DIAGNOSIS — M069 Rheumatoid arthritis, unspecified: Secondary | ICD-10-CM | POA: Diagnosis not present

## 2018-02-14 DIAGNOSIS — Z4800 Encounter for change or removal of nonsurgical wound dressing: Secondary | ICD-10-CM | POA: Diagnosis not present

## 2018-02-14 DIAGNOSIS — D649 Anemia, unspecified: Secondary | ICD-10-CM | POA: Diagnosis not present

## 2018-02-19 DIAGNOSIS — N319 Neuromuscular dysfunction of bladder, unspecified: Secondary | ICD-10-CM | POA: Diagnosis not present

## 2018-02-19 DIAGNOSIS — Z4800 Encounter for change or removal of nonsurgical wound dressing: Secondary | ICD-10-CM | POA: Diagnosis not present

## 2018-02-19 DIAGNOSIS — L89154 Pressure ulcer of sacral region, stage 4: Secondary | ICD-10-CM | POA: Diagnosis not present

## 2018-02-19 DIAGNOSIS — Z933 Colostomy status: Secondary | ICD-10-CM | POA: Diagnosis not present

## 2018-02-19 DIAGNOSIS — G8220 Paraplegia, unspecified: Secondary | ICD-10-CM | POA: Diagnosis not present

## 2018-02-19 DIAGNOSIS — D649 Anemia, unspecified: Secondary | ICD-10-CM | POA: Diagnosis not present

## 2018-02-19 DIAGNOSIS — M069 Rheumatoid arthritis, unspecified: Secondary | ICD-10-CM | POA: Diagnosis not present

## 2018-02-19 DIAGNOSIS — I10 Essential (primary) hypertension: Secondary | ICD-10-CM | POA: Diagnosis not present

## 2018-02-19 DIAGNOSIS — N401 Enlarged prostate with lower urinary tract symptoms: Secondary | ICD-10-CM | POA: Diagnosis not present

## 2018-02-20 DIAGNOSIS — Z87891 Personal history of nicotine dependence: Secondary | ICD-10-CM | POA: Diagnosis not present

## 2018-02-20 DIAGNOSIS — S24103S Unspecified injury at T7-T10 level of thoracic spinal cord, sequela: Secondary | ICD-10-CM | POA: Diagnosis not present

## 2018-02-20 DIAGNOSIS — L89154 Pressure ulcer of sacral region, stage 4: Secondary | ICD-10-CM | POA: Diagnosis not present

## 2018-02-21 DIAGNOSIS — L89154 Pressure ulcer of sacral region, stage 4: Secondary | ICD-10-CM | POA: Diagnosis not present

## 2018-02-21 DIAGNOSIS — G8220 Paraplegia, unspecified: Secondary | ICD-10-CM | POA: Diagnosis not present

## 2018-02-21 DIAGNOSIS — Z4800 Encounter for change or removal of nonsurgical wound dressing: Secondary | ICD-10-CM | POA: Diagnosis not present

## 2018-02-21 DIAGNOSIS — M069 Rheumatoid arthritis, unspecified: Secondary | ICD-10-CM | POA: Diagnosis not present

## 2018-02-21 DIAGNOSIS — Z933 Colostomy status: Secondary | ICD-10-CM | POA: Diagnosis not present

## 2018-02-21 DIAGNOSIS — N401 Enlarged prostate with lower urinary tract symptoms: Secondary | ICD-10-CM | POA: Diagnosis not present

## 2018-02-21 DIAGNOSIS — I10 Essential (primary) hypertension: Secondary | ICD-10-CM | POA: Diagnosis not present

## 2018-02-21 DIAGNOSIS — D649 Anemia, unspecified: Secondary | ICD-10-CM | POA: Diagnosis not present

## 2018-02-21 DIAGNOSIS — N319 Neuromuscular dysfunction of bladder, unspecified: Secondary | ICD-10-CM | POA: Diagnosis not present

## 2018-02-22 ENCOUNTER — Ambulatory Visit: Payer: Medicare HMO | Admitting: Physician Assistant

## 2018-02-23 DIAGNOSIS — N319 Neuromuscular dysfunction of bladder, unspecified: Secondary | ICD-10-CM | POA: Diagnosis not present

## 2018-02-23 DIAGNOSIS — I10 Essential (primary) hypertension: Secondary | ICD-10-CM | POA: Diagnosis not present

## 2018-02-23 DIAGNOSIS — N401 Enlarged prostate with lower urinary tract symptoms: Secondary | ICD-10-CM | POA: Diagnosis not present

## 2018-02-23 DIAGNOSIS — M069 Rheumatoid arthritis, unspecified: Secondary | ICD-10-CM | POA: Diagnosis not present

## 2018-02-23 DIAGNOSIS — D649 Anemia, unspecified: Secondary | ICD-10-CM | POA: Diagnosis not present

## 2018-02-23 DIAGNOSIS — Z933 Colostomy status: Secondary | ICD-10-CM | POA: Diagnosis not present

## 2018-02-23 DIAGNOSIS — L89154 Pressure ulcer of sacral region, stage 4: Secondary | ICD-10-CM | POA: Diagnosis not present

## 2018-02-23 DIAGNOSIS — G8220 Paraplegia, unspecified: Secondary | ICD-10-CM | POA: Diagnosis not present

## 2018-02-23 DIAGNOSIS — Z4800 Encounter for change or removal of nonsurgical wound dressing: Secondary | ICD-10-CM | POA: Diagnosis not present

## 2018-02-26 DIAGNOSIS — Z4800 Encounter for change or removal of nonsurgical wound dressing: Secondary | ICD-10-CM | POA: Diagnosis not present

## 2018-02-26 DIAGNOSIS — L89154 Pressure ulcer of sacral region, stage 4: Secondary | ICD-10-CM | POA: Diagnosis not present

## 2018-02-26 DIAGNOSIS — I10 Essential (primary) hypertension: Secondary | ICD-10-CM | POA: Diagnosis not present

## 2018-02-26 DIAGNOSIS — N401 Enlarged prostate with lower urinary tract symptoms: Secondary | ICD-10-CM | POA: Diagnosis not present

## 2018-02-26 DIAGNOSIS — N319 Neuromuscular dysfunction of bladder, unspecified: Secondary | ICD-10-CM | POA: Diagnosis not present

## 2018-02-26 DIAGNOSIS — G8220 Paraplegia, unspecified: Secondary | ICD-10-CM | POA: Diagnosis not present

## 2018-02-26 DIAGNOSIS — D649 Anemia, unspecified: Secondary | ICD-10-CM | POA: Diagnosis not present

## 2018-02-26 DIAGNOSIS — Z933 Colostomy status: Secondary | ICD-10-CM | POA: Diagnosis not present

## 2018-02-26 DIAGNOSIS — M069 Rheumatoid arthritis, unspecified: Secondary | ICD-10-CM | POA: Diagnosis not present

## 2018-02-28 DIAGNOSIS — Z4800 Encounter for change or removal of nonsurgical wound dressing: Secondary | ICD-10-CM | POA: Diagnosis not present

## 2018-02-28 DIAGNOSIS — N319 Neuromuscular dysfunction of bladder, unspecified: Secondary | ICD-10-CM | POA: Diagnosis not present

## 2018-02-28 DIAGNOSIS — D649 Anemia, unspecified: Secondary | ICD-10-CM | POA: Diagnosis not present

## 2018-02-28 DIAGNOSIS — Z933 Colostomy status: Secondary | ICD-10-CM | POA: Diagnosis not present

## 2018-02-28 DIAGNOSIS — I10 Essential (primary) hypertension: Secondary | ICD-10-CM | POA: Diagnosis not present

## 2018-02-28 DIAGNOSIS — G8220 Paraplegia, unspecified: Secondary | ICD-10-CM | POA: Diagnosis not present

## 2018-02-28 DIAGNOSIS — L89154 Pressure ulcer of sacral region, stage 4: Secondary | ICD-10-CM | POA: Diagnosis not present

## 2018-02-28 DIAGNOSIS — M069 Rheumatoid arthritis, unspecified: Secondary | ICD-10-CM | POA: Diagnosis not present

## 2018-02-28 DIAGNOSIS — N401 Enlarged prostate with lower urinary tract symptoms: Secondary | ICD-10-CM | POA: Diagnosis not present

## 2018-03-02 DIAGNOSIS — N401 Enlarged prostate with lower urinary tract symptoms: Secondary | ICD-10-CM | POA: Diagnosis not present

## 2018-03-02 DIAGNOSIS — D649 Anemia, unspecified: Secondary | ICD-10-CM | POA: Diagnosis not present

## 2018-03-02 DIAGNOSIS — M069 Rheumatoid arthritis, unspecified: Secondary | ICD-10-CM | POA: Diagnosis not present

## 2018-03-02 DIAGNOSIS — L89154 Pressure ulcer of sacral region, stage 4: Secondary | ICD-10-CM | POA: Diagnosis not present

## 2018-03-02 DIAGNOSIS — N319 Neuromuscular dysfunction of bladder, unspecified: Secondary | ICD-10-CM | POA: Diagnosis not present

## 2018-03-02 DIAGNOSIS — Z933 Colostomy status: Secondary | ICD-10-CM | POA: Diagnosis not present

## 2018-03-02 DIAGNOSIS — G8220 Paraplegia, unspecified: Secondary | ICD-10-CM | POA: Diagnosis not present

## 2018-03-02 DIAGNOSIS — I10 Essential (primary) hypertension: Secondary | ICD-10-CM | POA: Diagnosis not present

## 2018-03-02 DIAGNOSIS — Z4800 Encounter for change or removal of nonsurgical wound dressing: Secondary | ICD-10-CM | POA: Diagnosis not present

## 2018-03-05 DIAGNOSIS — L89154 Pressure ulcer of sacral region, stage 4: Secondary | ICD-10-CM | POA: Diagnosis not present

## 2018-03-05 DIAGNOSIS — D649 Anemia, unspecified: Secondary | ICD-10-CM | POA: Diagnosis not present

## 2018-03-05 DIAGNOSIS — N401 Enlarged prostate with lower urinary tract symptoms: Secondary | ICD-10-CM | POA: Diagnosis not present

## 2018-03-05 DIAGNOSIS — N319 Neuromuscular dysfunction of bladder, unspecified: Secondary | ICD-10-CM | POA: Diagnosis not present

## 2018-03-05 DIAGNOSIS — G8220 Paraplegia, unspecified: Secondary | ICD-10-CM | POA: Diagnosis not present

## 2018-03-05 DIAGNOSIS — Z4800 Encounter for change or removal of nonsurgical wound dressing: Secondary | ICD-10-CM | POA: Diagnosis not present

## 2018-03-05 DIAGNOSIS — Z933 Colostomy status: Secondary | ICD-10-CM | POA: Diagnosis not present

## 2018-03-05 DIAGNOSIS — M069 Rheumatoid arthritis, unspecified: Secondary | ICD-10-CM | POA: Diagnosis not present

## 2018-03-05 DIAGNOSIS — I10 Essential (primary) hypertension: Secondary | ICD-10-CM | POA: Diagnosis not present

## 2018-03-07 DIAGNOSIS — Z4800 Encounter for change or removal of nonsurgical wound dressing: Secondary | ICD-10-CM | POA: Diagnosis not present

## 2018-03-07 DIAGNOSIS — L89154 Pressure ulcer of sacral region, stage 4: Secondary | ICD-10-CM | POA: Diagnosis not present

## 2018-03-07 DIAGNOSIS — G8220 Paraplegia, unspecified: Secondary | ICD-10-CM | POA: Diagnosis not present

## 2018-03-07 DIAGNOSIS — Z933 Colostomy status: Secondary | ICD-10-CM | POA: Diagnosis not present

## 2018-03-07 DIAGNOSIS — I10 Essential (primary) hypertension: Secondary | ICD-10-CM | POA: Diagnosis not present

## 2018-03-07 DIAGNOSIS — D649 Anemia, unspecified: Secondary | ICD-10-CM | POA: Diagnosis not present

## 2018-03-07 DIAGNOSIS — N401 Enlarged prostate with lower urinary tract symptoms: Secondary | ICD-10-CM | POA: Diagnosis not present

## 2018-03-07 DIAGNOSIS — M069 Rheumatoid arthritis, unspecified: Secondary | ICD-10-CM | POA: Diagnosis not present

## 2018-03-07 DIAGNOSIS — N319 Neuromuscular dysfunction of bladder, unspecified: Secondary | ICD-10-CM | POA: Diagnosis not present

## 2018-03-09 DIAGNOSIS — L89154 Pressure ulcer of sacral region, stage 4: Secondary | ICD-10-CM | POA: Diagnosis not present

## 2018-03-09 DIAGNOSIS — M069 Rheumatoid arthritis, unspecified: Secondary | ICD-10-CM | POA: Diagnosis not present

## 2018-03-09 DIAGNOSIS — I10 Essential (primary) hypertension: Secondary | ICD-10-CM | POA: Diagnosis not present

## 2018-03-09 DIAGNOSIS — Z4800 Encounter for change or removal of nonsurgical wound dressing: Secondary | ICD-10-CM | POA: Diagnosis not present

## 2018-03-09 DIAGNOSIS — G8220 Paraplegia, unspecified: Secondary | ICD-10-CM | POA: Diagnosis not present

## 2018-03-09 DIAGNOSIS — N401 Enlarged prostate with lower urinary tract symptoms: Secondary | ICD-10-CM | POA: Diagnosis not present

## 2018-03-09 DIAGNOSIS — N319 Neuromuscular dysfunction of bladder, unspecified: Secondary | ICD-10-CM | POA: Diagnosis not present

## 2018-03-09 DIAGNOSIS — D649 Anemia, unspecified: Secondary | ICD-10-CM | POA: Diagnosis not present

## 2018-03-09 DIAGNOSIS — Z933 Colostomy status: Secondary | ICD-10-CM | POA: Diagnosis not present

## 2018-03-12 DIAGNOSIS — D649 Anemia, unspecified: Secondary | ICD-10-CM | POA: Diagnosis not present

## 2018-03-12 DIAGNOSIS — L89154 Pressure ulcer of sacral region, stage 4: Secondary | ICD-10-CM | POA: Diagnosis not present

## 2018-03-12 DIAGNOSIS — G8220 Paraplegia, unspecified: Secondary | ICD-10-CM | POA: Diagnosis not present

## 2018-03-12 DIAGNOSIS — N401 Enlarged prostate with lower urinary tract symptoms: Secondary | ICD-10-CM | POA: Diagnosis not present

## 2018-03-12 DIAGNOSIS — Z933 Colostomy status: Secondary | ICD-10-CM | POA: Diagnosis not present

## 2018-03-12 DIAGNOSIS — M6281 Muscle weakness (generalized): Secondary | ICD-10-CM | POA: Diagnosis not present

## 2018-03-12 DIAGNOSIS — Z4800 Encounter for change or removal of nonsurgical wound dressing: Secondary | ICD-10-CM | POA: Diagnosis not present

## 2018-03-12 DIAGNOSIS — N319 Neuromuscular dysfunction of bladder, unspecified: Secondary | ICD-10-CM | POA: Diagnosis not present

## 2018-03-12 DIAGNOSIS — M069 Rheumatoid arthritis, unspecified: Secondary | ICD-10-CM | POA: Diagnosis not present

## 2018-03-12 DIAGNOSIS — L89159 Pressure ulcer of sacral region, unspecified stage: Secondary | ICD-10-CM | POA: Diagnosis not present

## 2018-03-12 DIAGNOSIS — L89313 Pressure ulcer of right buttock, stage 3: Secondary | ICD-10-CM | POA: Diagnosis not present

## 2018-03-12 DIAGNOSIS — L89323 Pressure ulcer of left buttock, stage 3: Secondary | ICD-10-CM | POA: Diagnosis not present

## 2018-03-12 DIAGNOSIS — I10 Essential (primary) hypertension: Secondary | ICD-10-CM | POA: Diagnosis not present

## 2018-03-12 DIAGNOSIS — T8189XA Other complications of procedures, not elsewhere classified, initial encounter: Secondary | ICD-10-CM | POA: Diagnosis not present

## 2018-03-13 DIAGNOSIS — L89154 Pressure ulcer of sacral region, stage 4: Secondary | ICD-10-CM | POA: Diagnosis not present

## 2018-03-13 DIAGNOSIS — E440 Moderate protein-calorie malnutrition: Secondary | ICD-10-CM | POA: Diagnosis not present

## 2018-03-14 DIAGNOSIS — Z4800 Encounter for change or removal of nonsurgical wound dressing: Secondary | ICD-10-CM | POA: Diagnosis not present

## 2018-03-14 DIAGNOSIS — I10 Essential (primary) hypertension: Secondary | ICD-10-CM | POA: Diagnosis not present

## 2018-03-14 DIAGNOSIS — N319 Neuromuscular dysfunction of bladder, unspecified: Secondary | ICD-10-CM | POA: Diagnosis not present

## 2018-03-14 DIAGNOSIS — M069 Rheumatoid arthritis, unspecified: Secondary | ICD-10-CM | POA: Diagnosis not present

## 2018-03-14 DIAGNOSIS — G8220 Paraplegia, unspecified: Secondary | ICD-10-CM | POA: Diagnosis not present

## 2018-03-14 DIAGNOSIS — N401 Enlarged prostate with lower urinary tract symptoms: Secondary | ICD-10-CM | POA: Diagnosis not present

## 2018-03-14 DIAGNOSIS — L89154 Pressure ulcer of sacral region, stage 4: Secondary | ICD-10-CM | POA: Diagnosis not present

## 2018-03-14 DIAGNOSIS — Z933 Colostomy status: Secondary | ICD-10-CM | POA: Diagnosis not present

## 2018-03-14 DIAGNOSIS — D649 Anemia, unspecified: Secondary | ICD-10-CM | POA: Diagnosis not present

## 2018-03-19 DIAGNOSIS — M069 Rheumatoid arthritis, unspecified: Secondary | ICD-10-CM | POA: Diagnosis not present

## 2018-03-19 DIAGNOSIS — L89154 Pressure ulcer of sacral region, stage 4: Secondary | ICD-10-CM | POA: Diagnosis not present

## 2018-03-19 DIAGNOSIS — D649 Anemia, unspecified: Secondary | ICD-10-CM | POA: Diagnosis not present

## 2018-03-19 DIAGNOSIS — N401 Enlarged prostate with lower urinary tract symptoms: Secondary | ICD-10-CM | POA: Diagnosis not present

## 2018-03-19 DIAGNOSIS — Z4800 Encounter for change or removal of nonsurgical wound dressing: Secondary | ICD-10-CM | POA: Diagnosis not present

## 2018-03-19 DIAGNOSIS — I10 Essential (primary) hypertension: Secondary | ICD-10-CM | POA: Diagnosis not present

## 2018-03-19 DIAGNOSIS — N319 Neuromuscular dysfunction of bladder, unspecified: Secondary | ICD-10-CM | POA: Diagnosis not present

## 2018-03-19 DIAGNOSIS — Z933 Colostomy status: Secondary | ICD-10-CM | POA: Diagnosis not present

## 2018-03-19 DIAGNOSIS — G8220 Paraplegia, unspecified: Secondary | ICD-10-CM | POA: Diagnosis not present

## 2018-03-23 DIAGNOSIS — N319 Neuromuscular dysfunction of bladder, unspecified: Secondary | ICD-10-CM | POA: Diagnosis not present

## 2018-03-23 DIAGNOSIS — G8220 Paraplegia, unspecified: Secondary | ICD-10-CM | POA: Diagnosis not present

## 2018-03-23 DIAGNOSIS — L89154 Pressure ulcer of sacral region, stage 4: Secondary | ICD-10-CM | POA: Diagnosis not present

## 2018-03-23 DIAGNOSIS — M069 Rheumatoid arthritis, unspecified: Secondary | ICD-10-CM | POA: Diagnosis not present

## 2018-03-23 DIAGNOSIS — D649 Anemia, unspecified: Secondary | ICD-10-CM | POA: Diagnosis not present

## 2018-03-23 DIAGNOSIS — Z933 Colostomy status: Secondary | ICD-10-CM | POA: Diagnosis not present

## 2018-03-23 DIAGNOSIS — I10 Essential (primary) hypertension: Secondary | ICD-10-CM | POA: Diagnosis not present

## 2018-03-23 DIAGNOSIS — Z4800 Encounter for change or removal of nonsurgical wound dressing: Secondary | ICD-10-CM | POA: Diagnosis not present

## 2018-03-23 DIAGNOSIS — N401 Enlarged prostate with lower urinary tract symptoms: Secondary | ICD-10-CM | POA: Diagnosis not present

## 2018-03-26 DIAGNOSIS — M069 Rheumatoid arthritis, unspecified: Secondary | ICD-10-CM | POA: Diagnosis not present

## 2018-03-26 DIAGNOSIS — G8220 Paraplegia, unspecified: Secondary | ICD-10-CM | POA: Diagnosis not present

## 2018-03-26 DIAGNOSIS — N401 Enlarged prostate with lower urinary tract symptoms: Secondary | ICD-10-CM | POA: Diagnosis not present

## 2018-03-26 DIAGNOSIS — Z4800 Encounter for change or removal of nonsurgical wound dressing: Secondary | ICD-10-CM | POA: Diagnosis not present

## 2018-03-26 DIAGNOSIS — N319 Neuromuscular dysfunction of bladder, unspecified: Secondary | ICD-10-CM | POA: Diagnosis not present

## 2018-03-26 DIAGNOSIS — L89154 Pressure ulcer of sacral region, stage 4: Secondary | ICD-10-CM | POA: Diagnosis not present

## 2018-03-26 DIAGNOSIS — I10 Essential (primary) hypertension: Secondary | ICD-10-CM | POA: Diagnosis not present

## 2018-03-26 DIAGNOSIS — D649 Anemia, unspecified: Secondary | ICD-10-CM | POA: Diagnosis not present

## 2018-03-26 DIAGNOSIS — Z933 Colostomy status: Secondary | ICD-10-CM | POA: Diagnosis not present

## 2018-03-27 DIAGNOSIS — Z23 Encounter for immunization: Secondary | ICD-10-CM | POA: Diagnosis not present

## 2018-03-27 DIAGNOSIS — I10 Essential (primary) hypertension: Secondary | ICD-10-CM | POA: Diagnosis not present

## 2018-03-27 DIAGNOSIS — Z136 Encounter for screening for cardiovascular disorders: Secondary | ICD-10-CM | POA: Diagnosis not present

## 2018-03-27 DIAGNOSIS — E8809 Other disorders of plasma-protein metabolism, not elsewhere classified: Secondary | ICD-10-CM | POA: Diagnosis not present

## 2018-03-30 DIAGNOSIS — I10 Essential (primary) hypertension: Secondary | ICD-10-CM | POA: Diagnosis not present

## 2018-03-30 DIAGNOSIS — L89154 Pressure ulcer of sacral region, stage 4: Secondary | ICD-10-CM | POA: Diagnosis not present

## 2018-03-30 DIAGNOSIS — N401 Enlarged prostate with lower urinary tract symptoms: Secondary | ICD-10-CM | POA: Diagnosis not present

## 2018-03-30 DIAGNOSIS — D649 Anemia, unspecified: Secondary | ICD-10-CM | POA: Diagnosis not present

## 2018-03-30 DIAGNOSIS — Z933 Colostomy status: Secondary | ICD-10-CM | POA: Diagnosis not present

## 2018-03-30 DIAGNOSIS — Z4800 Encounter for change or removal of nonsurgical wound dressing: Secondary | ICD-10-CM | POA: Diagnosis not present

## 2018-03-30 DIAGNOSIS — M069 Rheumatoid arthritis, unspecified: Secondary | ICD-10-CM | POA: Diagnosis not present

## 2018-03-30 DIAGNOSIS — N319 Neuromuscular dysfunction of bladder, unspecified: Secondary | ICD-10-CM | POA: Diagnosis not present

## 2018-03-30 DIAGNOSIS — G8220 Paraplegia, unspecified: Secondary | ICD-10-CM | POA: Diagnosis not present

## 2018-04-02 DIAGNOSIS — Z4800 Encounter for change or removal of nonsurgical wound dressing: Secondary | ICD-10-CM | POA: Diagnosis not present

## 2018-04-02 DIAGNOSIS — M069 Rheumatoid arthritis, unspecified: Secondary | ICD-10-CM | POA: Diagnosis not present

## 2018-04-02 DIAGNOSIS — G8220 Paraplegia, unspecified: Secondary | ICD-10-CM | POA: Diagnosis not present

## 2018-04-02 DIAGNOSIS — N401 Enlarged prostate with lower urinary tract symptoms: Secondary | ICD-10-CM | POA: Diagnosis not present

## 2018-04-02 DIAGNOSIS — N319 Neuromuscular dysfunction of bladder, unspecified: Secondary | ICD-10-CM | POA: Diagnosis not present

## 2018-04-02 DIAGNOSIS — I10 Essential (primary) hypertension: Secondary | ICD-10-CM | POA: Diagnosis not present

## 2018-04-02 DIAGNOSIS — D649 Anemia, unspecified: Secondary | ICD-10-CM | POA: Diagnosis not present

## 2018-04-02 DIAGNOSIS — L89154 Pressure ulcer of sacral region, stage 4: Secondary | ICD-10-CM | POA: Diagnosis not present

## 2018-04-02 DIAGNOSIS — Z933 Colostomy status: Secondary | ICD-10-CM | POA: Diagnosis not present

## 2018-04-04 DIAGNOSIS — D649 Anemia, unspecified: Secondary | ICD-10-CM | POA: Diagnosis not present

## 2018-04-04 DIAGNOSIS — I10 Essential (primary) hypertension: Secondary | ICD-10-CM | POA: Diagnosis not present

## 2018-04-04 DIAGNOSIS — N401 Enlarged prostate with lower urinary tract symptoms: Secondary | ICD-10-CM | POA: Diagnosis not present

## 2018-04-04 DIAGNOSIS — L89154 Pressure ulcer of sacral region, stage 4: Secondary | ICD-10-CM | POA: Diagnosis not present

## 2018-04-04 DIAGNOSIS — N319 Neuromuscular dysfunction of bladder, unspecified: Secondary | ICD-10-CM | POA: Diagnosis not present

## 2018-04-04 DIAGNOSIS — Z4800 Encounter for change or removal of nonsurgical wound dressing: Secondary | ICD-10-CM | POA: Diagnosis not present

## 2018-04-04 DIAGNOSIS — Z933 Colostomy status: Secondary | ICD-10-CM | POA: Diagnosis not present

## 2018-04-04 DIAGNOSIS — G8220 Paraplegia, unspecified: Secondary | ICD-10-CM | POA: Diagnosis not present

## 2018-04-04 DIAGNOSIS — M069 Rheumatoid arthritis, unspecified: Secondary | ICD-10-CM | POA: Diagnosis not present

## 2018-04-05 DIAGNOSIS — Z933 Colostomy status: Secondary | ICD-10-CM | POA: Diagnosis not present

## 2018-04-05 DIAGNOSIS — L89154 Pressure ulcer of sacral region, stage 4: Secondary | ICD-10-CM | POA: Diagnosis not present

## 2018-04-05 DIAGNOSIS — M069 Rheumatoid arthritis, unspecified: Secondary | ICD-10-CM | POA: Diagnosis not present

## 2018-04-05 DIAGNOSIS — Z4800 Encounter for change or removal of nonsurgical wound dressing: Secondary | ICD-10-CM | POA: Diagnosis not present

## 2018-04-05 DIAGNOSIS — I10 Essential (primary) hypertension: Secondary | ICD-10-CM | POA: Diagnosis not present

## 2018-04-05 DIAGNOSIS — G8220 Paraplegia, unspecified: Secondary | ICD-10-CM | POA: Diagnosis not present

## 2018-04-05 DIAGNOSIS — N401 Enlarged prostate with lower urinary tract symptoms: Secondary | ICD-10-CM | POA: Diagnosis not present

## 2018-04-05 DIAGNOSIS — D649 Anemia, unspecified: Secondary | ICD-10-CM | POA: Diagnosis not present

## 2018-04-05 DIAGNOSIS — N319 Neuromuscular dysfunction of bladder, unspecified: Secondary | ICD-10-CM | POA: Diagnosis not present

## 2018-04-09 DIAGNOSIS — G8220 Paraplegia, unspecified: Secondary | ICD-10-CM | POA: Diagnosis not present

## 2018-04-09 DIAGNOSIS — D649 Anemia, unspecified: Secondary | ICD-10-CM | POA: Diagnosis not present

## 2018-04-09 DIAGNOSIS — M069 Rheumatoid arthritis, unspecified: Secondary | ICD-10-CM | POA: Diagnosis not present

## 2018-04-09 DIAGNOSIS — Z933 Colostomy status: Secondary | ICD-10-CM | POA: Diagnosis not present

## 2018-04-09 DIAGNOSIS — L89154 Pressure ulcer of sacral region, stage 4: Secondary | ICD-10-CM | POA: Diagnosis not present

## 2018-04-09 DIAGNOSIS — N319 Neuromuscular dysfunction of bladder, unspecified: Secondary | ICD-10-CM | POA: Diagnosis not present

## 2018-04-09 DIAGNOSIS — I10 Essential (primary) hypertension: Secondary | ICD-10-CM | POA: Diagnosis not present

## 2018-04-09 DIAGNOSIS — Z4800 Encounter for change or removal of nonsurgical wound dressing: Secondary | ICD-10-CM | POA: Diagnosis not present

## 2018-04-09 DIAGNOSIS — N401 Enlarged prostate with lower urinary tract symptoms: Secondary | ICD-10-CM | POA: Diagnosis not present

## 2018-04-09 DIAGNOSIS — M6281 Muscle weakness (generalized): Secondary | ICD-10-CM | POA: Diagnosis not present

## 2018-04-12 DIAGNOSIS — T8189XA Other complications of procedures, not elsewhere classified, initial encounter: Secondary | ICD-10-CM | POA: Diagnosis not present

## 2018-04-12 DIAGNOSIS — M6281 Muscle weakness (generalized): Secondary | ICD-10-CM | POA: Diagnosis not present

## 2018-04-12 DIAGNOSIS — I10 Essential (primary) hypertension: Secondary | ICD-10-CM | POA: Diagnosis not present

## 2018-04-12 DIAGNOSIS — G8220 Paraplegia, unspecified: Secondary | ICD-10-CM | POA: Diagnosis not present

## 2018-04-12 DIAGNOSIS — L89154 Pressure ulcer of sacral region, stage 4: Secondary | ICD-10-CM | POA: Diagnosis not present

## 2018-04-12 DIAGNOSIS — L89159 Pressure ulcer of sacral region, unspecified stage: Secondary | ICD-10-CM | POA: Diagnosis not present

## 2018-04-12 DIAGNOSIS — L89313 Pressure ulcer of right buttock, stage 3: Secondary | ICD-10-CM | POA: Diagnosis not present

## 2018-04-12 DIAGNOSIS — L89323 Pressure ulcer of left buttock, stage 3: Secondary | ICD-10-CM | POA: Diagnosis not present

## 2018-04-13 DIAGNOSIS — Z933 Colostomy status: Secondary | ICD-10-CM | POA: Diagnosis not present

## 2018-04-13 DIAGNOSIS — N319 Neuromuscular dysfunction of bladder, unspecified: Secondary | ICD-10-CM | POA: Diagnosis not present

## 2018-04-13 DIAGNOSIS — G8220 Paraplegia, unspecified: Secondary | ICD-10-CM | POA: Diagnosis not present

## 2018-04-13 DIAGNOSIS — Z4800 Encounter for change or removal of nonsurgical wound dressing: Secondary | ICD-10-CM | POA: Diagnosis not present

## 2018-04-13 DIAGNOSIS — L89154 Pressure ulcer of sacral region, stage 4: Secondary | ICD-10-CM | POA: Diagnosis not present

## 2018-04-13 DIAGNOSIS — M069 Rheumatoid arthritis, unspecified: Secondary | ICD-10-CM | POA: Diagnosis not present

## 2018-04-13 DIAGNOSIS — N401 Enlarged prostate with lower urinary tract symptoms: Secondary | ICD-10-CM | POA: Diagnosis not present

## 2018-04-13 DIAGNOSIS — D649 Anemia, unspecified: Secondary | ICD-10-CM | POA: Diagnosis not present

## 2018-04-13 DIAGNOSIS — I10 Essential (primary) hypertension: Secondary | ICD-10-CM | POA: Diagnosis not present

## 2018-04-16 DIAGNOSIS — D649 Anemia, unspecified: Secondary | ICD-10-CM | POA: Diagnosis not present

## 2018-04-16 DIAGNOSIS — N401 Enlarged prostate with lower urinary tract symptoms: Secondary | ICD-10-CM | POA: Diagnosis not present

## 2018-04-16 DIAGNOSIS — Z933 Colostomy status: Secondary | ICD-10-CM | POA: Diagnosis not present

## 2018-04-16 DIAGNOSIS — N319 Neuromuscular dysfunction of bladder, unspecified: Secondary | ICD-10-CM | POA: Diagnosis not present

## 2018-04-16 DIAGNOSIS — I10 Essential (primary) hypertension: Secondary | ICD-10-CM | POA: Diagnosis not present

## 2018-04-16 DIAGNOSIS — L89154 Pressure ulcer of sacral region, stage 4: Secondary | ICD-10-CM | POA: Diagnosis not present

## 2018-04-16 DIAGNOSIS — G8220 Paraplegia, unspecified: Secondary | ICD-10-CM | POA: Diagnosis not present

## 2018-04-16 DIAGNOSIS — M069 Rheumatoid arthritis, unspecified: Secondary | ICD-10-CM | POA: Diagnosis not present

## 2018-04-16 DIAGNOSIS — Z4800 Encounter for change or removal of nonsurgical wound dressing: Secondary | ICD-10-CM | POA: Diagnosis not present

## 2018-04-20 DIAGNOSIS — I10 Essential (primary) hypertension: Secondary | ICD-10-CM | POA: Diagnosis not present

## 2018-04-20 DIAGNOSIS — Z933 Colostomy status: Secondary | ICD-10-CM | POA: Diagnosis not present

## 2018-04-20 DIAGNOSIS — G8220 Paraplegia, unspecified: Secondary | ICD-10-CM | POA: Diagnosis not present

## 2018-04-20 DIAGNOSIS — N401 Enlarged prostate with lower urinary tract symptoms: Secondary | ICD-10-CM | POA: Diagnosis not present

## 2018-04-20 DIAGNOSIS — M069 Rheumatoid arthritis, unspecified: Secondary | ICD-10-CM | POA: Diagnosis not present

## 2018-04-20 DIAGNOSIS — N319 Neuromuscular dysfunction of bladder, unspecified: Secondary | ICD-10-CM | POA: Diagnosis not present

## 2018-04-20 DIAGNOSIS — Z4800 Encounter for change or removal of nonsurgical wound dressing: Secondary | ICD-10-CM | POA: Diagnosis not present

## 2018-04-20 DIAGNOSIS — D649 Anemia, unspecified: Secondary | ICD-10-CM | POA: Diagnosis not present

## 2018-04-20 DIAGNOSIS — L89154 Pressure ulcer of sacral region, stage 4: Secondary | ICD-10-CM | POA: Diagnosis not present

## 2018-04-23 DIAGNOSIS — D649 Anemia, unspecified: Secondary | ICD-10-CM | POA: Diagnosis not present

## 2018-04-23 DIAGNOSIS — I10 Essential (primary) hypertension: Secondary | ICD-10-CM | POA: Diagnosis not present

## 2018-04-23 DIAGNOSIS — Z933 Colostomy status: Secondary | ICD-10-CM | POA: Diagnosis not present

## 2018-04-23 DIAGNOSIS — N401 Enlarged prostate with lower urinary tract symptoms: Secondary | ICD-10-CM | POA: Diagnosis not present

## 2018-04-23 DIAGNOSIS — N319 Neuromuscular dysfunction of bladder, unspecified: Secondary | ICD-10-CM | POA: Diagnosis not present

## 2018-04-23 DIAGNOSIS — M069 Rheumatoid arthritis, unspecified: Secondary | ICD-10-CM | POA: Diagnosis not present

## 2018-04-23 DIAGNOSIS — G8220 Paraplegia, unspecified: Secondary | ICD-10-CM | POA: Diagnosis not present

## 2018-04-23 DIAGNOSIS — Z4800 Encounter for change or removal of nonsurgical wound dressing: Secondary | ICD-10-CM | POA: Diagnosis not present

## 2018-04-23 DIAGNOSIS — L89154 Pressure ulcer of sacral region, stage 4: Secondary | ICD-10-CM | POA: Diagnosis not present

## 2018-04-26 DIAGNOSIS — L89154 Pressure ulcer of sacral region, stage 4: Secondary | ICD-10-CM | POA: Diagnosis not present

## 2018-04-26 DIAGNOSIS — M869 Osteomyelitis, unspecified: Secondary | ICD-10-CM | POA: Diagnosis not present

## 2018-04-27 DIAGNOSIS — N319 Neuromuscular dysfunction of bladder, unspecified: Secondary | ICD-10-CM | POA: Diagnosis not present

## 2018-04-27 DIAGNOSIS — L89154 Pressure ulcer of sacral region, stage 4: Secondary | ICD-10-CM | POA: Diagnosis not present

## 2018-04-27 DIAGNOSIS — Z4800 Encounter for change or removal of nonsurgical wound dressing: Secondary | ICD-10-CM | POA: Diagnosis not present

## 2018-04-27 DIAGNOSIS — G8220 Paraplegia, unspecified: Secondary | ICD-10-CM | POA: Diagnosis not present

## 2018-04-27 DIAGNOSIS — D649 Anemia, unspecified: Secondary | ICD-10-CM | POA: Diagnosis not present

## 2018-04-27 DIAGNOSIS — M069 Rheumatoid arthritis, unspecified: Secondary | ICD-10-CM | POA: Diagnosis not present

## 2018-04-27 DIAGNOSIS — Z933 Colostomy status: Secondary | ICD-10-CM | POA: Diagnosis not present

## 2018-04-27 DIAGNOSIS — I10 Essential (primary) hypertension: Secondary | ICD-10-CM | POA: Diagnosis not present

## 2018-04-27 DIAGNOSIS — N401 Enlarged prostate with lower urinary tract symptoms: Secondary | ICD-10-CM | POA: Diagnosis not present

## 2018-04-30 DIAGNOSIS — I10 Essential (primary) hypertension: Secondary | ICD-10-CM | POA: Diagnosis not present

## 2018-04-30 DIAGNOSIS — M069 Rheumatoid arthritis, unspecified: Secondary | ICD-10-CM | POA: Diagnosis not present

## 2018-04-30 DIAGNOSIS — G8220 Paraplegia, unspecified: Secondary | ICD-10-CM | POA: Diagnosis not present

## 2018-04-30 DIAGNOSIS — Z933 Colostomy status: Secondary | ICD-10-CM | POA: Diagnosis not present

## 2018-04-30 DIAGNOSIS — N319 Neuromuscular dysfunction of bladder, unspecified: Secondary | ICD-10-CM | POA: Diagnosis not present

## 2018-04-30 DIAGNOSIS — D649 Anemia, unspecified: Secondary | ICD-10-CM | POA: Diagnosis not present

## 2018-04-30 DIAGNOSIS — Z4800 Encounter for change or removal of nonsurgical wound dressing: Secondary | ICD-10-CM | POA: Diagnosis not present

## 2018-04-30 DIAGNOSIS — L89154 Pressure ulcer of sacral region, stage 4: Secondary | ICD-10-CM | POA: Diagnosis not present

## 2018-04-30 DIAGNOSIS — N401 Enlarged prostate with lower urinary tract symptoms: Secondary | ICD-10-CM | POA: Diagnosis not present

## 2018-05-04 DIAGNOSIS — L89154 Pressure ulcer of sacral region, stage 4: Secondary | ICD-10-CM | POA: Diagnosis not present

## 2018-05-04 DIAGNOSIS — I10 Essential (primary) hypertension: Secondary | ICD-10-CM | POA: Diagnosis not present

## 2018-05-04 DIAGNOSIS — G8220 Paraplegia, unspecified: Secondary | ICD-10-CM | POA: Diagnosis not present

## 2018-05-04 DIAGNOSIS — Z4800 Encounter for change or removal of nonsurgical wound dressing: Secondary | ICD-10-CM | POA: Diagnosis not present

## 2018-05-04 DIAGNOSIS — N401 Enlarged prostate with lower urinary tract symptoms: Secondary | ICD-10-CM | POA: Diagnosis not present

## 2018-05-04 DIAGNOSIS — D649 Anemia, unspecified: Secondary | ICD-10-CM | POA: Diagnosis not present

## 2018-05-04 DIAGNOSIS — Z933 Colostomy status: Secondary | ICD-10-CM | POA: Diagnosis not present

## 2018-05-04 DIAGNOSIS — M069 Rheumatoid arthritis, unspecified: Secondary | ICD-10-CM | POA: Diagnosis not present

## 2018-05-04 DIAGNOSIS — N319 Neuromuscular dysfunction of bladder, unspecified: Secondary | ICD-10-CM | POA: Diagnosis not present

## 2018-05-07 DIAGNOSIS — N401 Enlarged prostate with lower urinary tract symptoms: Secondary | ICD-10-CM | POA: Diagnosis not present

## 2018-05-07 DIAGNOSIS — Z933 Colostomy status: Secondary | ICD-10-CM | POA: Diagnosis not present

## 2018-05-07 DIAGNOSIS — N319 Neuromuscular dysfunction of bladder, unspecified: Secondary | ICD-10-CM | POA: Diagnosis not present

## 2018-05-07 DIAGNOSIS — I10 Essential (primary) hypertension: Secondary | ICD-10-CM | POA: Diagnosis not present

## 2018-05-07 DIAGNOSIS — L89154 Pressure ulcer of sacral region, stage 4: Secondary | ICD-10-CM | POA: Diagnosis not present

## 2018-05-07 DIAGNOSIS — M069 Rheumatoid arthritis, unspecified: Secondary | ICD-10-CM | POA: Diagnosis not present

## 2018-05-07 DIAGNOSIS — D649 Anemia, unspecified: Secondary | ICD-10-CM | POA: Diagnosis not present

## 2018-05-07 DIAGNOSIS — G8220 Paraplegia, unspecified: Secondary | ICD-10-CM | POA: Diagnosis not present

## 2018-05-07 DIAGNOSIS — Z4800 Encounter for change or removal of nonsurgical wound dressing: Secondary | ICD-10-CM | POA: Diagnosis not present

## 2018-05-09 DIAGNOSIS — M6281 Muscle weakness (generalized): Secondary | ICD-10-CM | POA: Diagnosis not present

## 2018-05-09 DIAGNOSIS — I10 Essential (primary) hypertension: Secondary | ICD-10-CM | POA: Diagnosis not present

## 2018-05-11 DIAGNOSIS — I10 Essential (primary) hypertension: Secondary | ICD-10-CM | POA: Diagnosis not present

## 2018-05-11 DIAGNOSIS — N319 Neuromuscular dysfunction of bladder, unspecified: Secondary | ICD-10-CM | POA: Diagnosis not present

## 2018-05-11 DIAGNOSIS — Z933 Colostomy status: Secondary | ICD-10-CM | POA: Diagnosis not present

## 2018-05-11 DIAGNOSIS — M069 Rheumatoid arthritis, unspecified: Secondary | ICD-10-CM | POA: Diagnosis not present

## 2018-05-11 DIAGNOSIS — N401 Enlarged prostate with lower urinary tract symptoms: Secondary | ICD-10-CM | POA: Diagnosis not present

## 2018-05-11 DIAGNOSIS — Z4800 Encounter for change or removal of nonsurgical wound dressing: Secondary | ICD-10-CM | POA: Diagnosis not present

## 2018-05-11 DIAGNOSIS — G8220 Paraplegia, unspecified: Secondary | ICD-10-CM | POA: Diagnosis not present

## 2018-05-11 DIAGNOSIS — L89154 Pressure ulcer of sacral region, stage 4: Secondary | ICD-10-CM | POA: Diagnosis not present

## 2018-05-11 DIAGNOSIS — D649 Anemia, unspecified: Secondary | ICD-10-CM | POA: Diagnosis not present

## 2018-05-12 DIAGNOSIS — I10 Essential (primary) hypertension: Secondary | ICD-10-CM | POA: Diagnosis not present

## 2018-05-12 DIAGNOSIS — L89313 Pressure ulcer of right buttock, stage 3: Secondary | ICD-10-CM | POA: Diagnosis not present

## 2018-05-12 DIAGNOSIS — L89323 Pressure ulcer of left buttock, stage 3: Secondary | ICD-10-CM | POA: Diagnosis not present

## 2018-05-12 DIAGNOSIS — L89159 Pressure ulcer of sacral region, unspecified stage: Secondary | ICD-10-CM | POA: Diagnosis not present

## 2018-05-12 DIAGNOSIS — G8220 Paraplegia, unspecified: Secondary | ICD-10-CM | POA: Diagnosis not present

## 2018-05-12 DIAGNOSIS — M6281 Muscle weakness (generalized): Secondary | ICD-10-CM | POA: Diagnosis not present

## 2018-05-12 DIAGNOSIS — T8189XA Other complications of procedures, not elsewhere classified, initial encounter: Secondary | ICD-10-CM | POA: Diagnosis not present

## 2018-05-12 DIAGNOSIS — L89154 Pressure ulcer of sacral region, stage 4: Secondary | ICD-10-CM | POA: Diagnosis not present

## 2018-05-14 DIAGNOSIS — N401 Enlarged prostate with lower urinary tract symptoms: Secondary | ICD-10-CM | POA: Diagnosis not present

## 2018-05-14 DIAGNOSIS — D649 Anemia, unspecified: Secondary | ICD-10-CM | POA: Diagnosis not present

## 2018-05-14 DIAGNOSIS — I10 Essential (primary) hypertension: Secondary | ICD-10-CM | POA: Diagnosis not present

## 2018-05-14 DIAGNOSIS — G8220 Paraplegia, unspecified: Secondary | ICD-10-CM | POA: Diagnosis not present

## 2018-05-14 DIAGNOSIS — Z4800 Encounter for change or removal of nonsurgical wound dressing: Secondary | ICD-10-CM | POA: Diagnosis not present

## 2018-05-14 DIAGNOSIS — M069 Rheumatoid arthritis, unspecified: Secondary | ICD-10-CM | POA: Diagnosis not present

## 2018-05-14 DIAGNOSIS — Z933 Colostomy status: Secondary | ICD-10-CM | POA: Diagnosis not present

## 2018-05-14 DIAGNOSIS — N319 Neuromuscular dysfunction of bladder, unspecified: Secondary | ICD-10-CM | POA: Diagnosis not present

## 2018-05-14 DIAGNOSIS — L89154 Pressure ulcer of sacral region, stage 4: Secondary | ICD-10-CM | POA: Diagnosis not present

## 2018-05-18 DIAGNOSIS — L89154 Pressure ulcer of sacral region, stage 4: Secondary | ICD-10-CM | POA: Diagnosis not present

## 2018-05-18 DIAGNOSIS — Z4800 Encounter for change or removal of nonsurgical wound dressing: Secondary | ICD-10-CM | POA: Diagnosis not present

## 2018-05-18 DIAGNOSIS — N319 Neuromuscular dysfunction of bladder, unspecified: Secondary | ICD-10-CM | POA: Diagnosis not present

## 2018-05-18 DIAGNOSIS — N401 Enlarged prostate with lower urinary tract symptoms: Secondary | ICD-10-CM | POA: Diagnosis not present

## 2018-05-18 DIAGNOSIS — I10 Essential (primary) hypertension: Secondary | ICD-10-CM | POA: Diagnosis not present

## 2018-05-18 DIAGNOSIS — G8220 Paraplegia, unspecified: Secondary | ICD-10-CM | POA: Diagnosis not present

## 2018-05-18 DIAGNOSIS — M069 Rheumatoid arthritis, unspecified: Secondary | ICD-10-CM | POA: Diagnosis not present

## 2018-05-18 DIAGNOSIS — D649 Anemia, unspecified: Secondary | ICD-10-CM | POA: Diagnosis not present

## 2018-05-18 DIAGNOSIS — Z933 Colostomy status: Secondary | ICD-10-CM | POA: Diagnosis not present

## 2018-05-22 DIAGNOSIS — G8220 Paraplegia, unspecified: Secondary | ICD-10-CM | POA: Diagnosis not present

## 2018-05-22 DIAGNOSIS — N401 Enlarged prostate with lower urinary tract symptoms: Secondary | ICD-10-CM | POA: Diagnosis not present

## 2018-05-22 DIAGNOSIS — I10 Essential (primary) hypertension: Secondary | ICD-10-CM | POA: Diagnosis not present

## 2018-05-22 DIAGNOSIS — M069 Rheumatoid arthritis, unspecified: Secondary | ICD-10-CM | POA: Diagnosis not present

## 2018-05-22 DIAGNOSIS — L89154 Pressure ulcer of sacral region, stage 4: Secondary | ICD-10-CM | POA: Diagnosis not present

## 2018-05-22 DIAGNOSIS — Z4800 Encounter for change or removal of nonsurgical wound dressing: Secondary | ICD-10-CM | POA: Diagnosis not present

## 2018-05-22 DIAGNOSIS — Z933 Colostomy status: Secondary | ICD-10-CM | POA: Diagnosis not present

## 2018-05-22 DIAGNOSIS — N319 Neuromuscular dysfunction of bladder, unspecified: Secondary | ICD-10-CM | POA: Diagnosis not present

## 2018-05-22 DIAGNOSIS — D649 Anemia, unspecified: Secondary | ICD-10-CM | POA: Diagnosis not present

## 2018-05-23 DIAGNOSIS — G8220 Paraplegia, unspecified: Secondary | ICD-10-CM | POA: Diagnosis not present

## 2018-05-23 DIAGNOSIS — L89154 Pressure ulcer of sacral region, stage 4: Secondary | ICD-10-CM | POA: Diagnosis not present

## 2018-05-24 DIAGNOSIS — M869 Osteomyelitis, unspecified: Secondary | ICD-10-CM | POA: Diagnosis not present

## 2018-05-24 DIAGNOSIS — K435 Parastomal hernia without obstruction or  gangrene: Secondary | ICD-10-CM | POA: Diagnosis not present

## 2018-05-24 DIAGNOSIS — L89154 Pressure ulcer of sacral region, stage 4: Secondary | ICD-10-CM | POA: Diagnosis not present

## 2018-05-25 DIAGNOSIS — G8220 Paraplegia, unspecified: Secondary | ICD-10-CM | POA: Diagnosis not present

## 2018-05-25 DIAGNOSIS — I10 Essential (primary) hypertension: Secondary | ICD-10-CM | POA: Diagnosis not present

## 2018-05-25 DIAGNOSIS — D649 Anemia, unspecified: Secondary | ICD-10-CM | POA: Diagnosis not present

## 2018-05-25 DIAGNOSIS — N401 Enlarged prostate with lower urinary tract symptoms: Secondary | ICD-10-CM | POA: Diagnosis not present

## 2018-05-25 DIAGNOSIS — L89154 Pressure ulcer of sacral region, stage 4: Secondary | ICD-10-CM | POA: Diagnosis not present

## 2018-05-25 DIAGNOSIS — N319 Neuromuscular dysfunction of bladder, unspecified: Secondary | ICD-10-CM | POA: Diagnosis not present

## 2018-05-25 DIAGNOSIS — Z933 Colostomy status: Secondary | ICD-10-CM | POA: Diagnosis not present

## 2018-05-25 DIAGNOSIS — M069 Rheumatoid arthritis, unspecified: Secondary | ICD-10-CM | POA: Diagnosis not present

## 2018-05-25 DIAGNOSIS — Z4800 Encounter for change or removal of nonsurgical wound dressing: Secondary | ICD-10-CM | POA: Diagnosis not present

## 2018-05-28 DIAGNOSIS — Z4800 Encounter for change or removal of nonsurgical wound dressing: Secondary | ICD-10-CM | POA: Diagnosis not present

## 2018-05-28 DIAGNOSIS — M069 Rheumatoid arthritis, unspecified: Secondary | ICD-10-CM | POA: Diagnosis not present

## 2018-05-28 DIAGNOSIS — L89154 Pressure ulcer of sacral region, stage 4: Secondary | ICD-10-CM | POA: Diagnosis not present

## 2018-05-28 DIAGNOSIS — G8220 Paraplegia, unspecified: Secondary | ICD-10-CM | POA: Diagnosis not present

## 2018-05-28 DIAGNOSIS — I10 Essential (primary) hypertension: Secondary | ICD-10-CM | POA: Diagnosis not present

## 2018-05-28 DIAGNOSIS — N401 Enlarged prostate with lower urinary tract symptoms: Secondary | ICD-10-CM | POA: Diagnosis not present

## 2018-05-28 DIAGNOSIS — N319 Neuromuscular dysfunction of bladder, unspecified: Secondary | ICD-10-CM | POA: Diagnosis not present

## 2018-05-28 DIAGNOSIS — D649 Anemia, unspecified: Secondary | ICD-10-CM | POA: Diagnosis not present

## 2018-05-28 DIAGNOSIS — Z933 Colostomy status: Secondary | ICD-10-CM | POA: Diagnosis not present

## 2018-06-01 ENCOUNTER — Encounter: Payer: Self-pay | Admitting: Internal Medicine

## 2018-06-01 ENCOUNTER — Other Ambulatory Visit: Payer: Self-pay

## 2018-06-01 ENCOUNTER — Inpatient Hospital Stay
Admission: EM | Admit: 2018-06-01 | Discharge: 2018-06-08 | DRG: 477 | Disposition: A | Discharge status: Skilled Nursing Facility | Payer: Medicare HMO | Attending: Family Medicine | Admitting: Family Medicine

## 2018-06-01 DIAGNOSIS — Z87891 Personal history of nicotine dependence: Secondary | ICD-10-CM | POA: Diagnosis not present

## 2018-06-01 DIAGNOSIS — Z993 Dependence on wheelchair: Secondary | ICD-10-CM | POA: Diagnosis not present

## 2018-06-01 DIAGNOSIS — Z933 Colostomy status: Secondary | ICD-10-CM

## 2018-06-01 DIAGNOSIS — R532 Functional quadriplegia: Secondary | ICD-10-CM | POA: Diagnosis present

## 2018-06-01 DIAGNOSIS — L89159 Pressure ulcer of sacral region, unspecified stage: Secondary | ICD-10-CM | POA: Diagnosis not present

## 2018-06-01 DIAGNOSIS — M069 Rheumatoid arthritis, unspecified: Secondary | ICD-10-CM | POA: Diagnosis not present

## 2018-06-01 DIAGNOSIS — Z4800 Encounter for change or removal of nonsurgical wound dressing: Secondary | ICD-10-CM | POA: Diagnosis not present

## 2018-06-01 DIAGNOSIS — L89150 Pressure ulcer of sacral region, unstageable: Secondary | ICD-10-CM | POA: Diagnosis not present

## 2018-06-01 DIAGNOSIS — T373X5A Adverse effect of other antiprotozoal drugs, initial encounter: Secondary | ICD-10-CM | POA: Diagnosis present

## 2018-06-01 DIAGNOSIS — Z79899 Other long term (current) drug therapy: Secondary | ICD-10-CM

## 2018-06-01 DIAGNOSIS — S31000A Unspecified open wound of lower back and pelvis without penetration into retroperitoneum, initial encounter: Secondary | ICD-10-CM | POA: Diagnosis present

## 2018-06-01 DIAGNOSIS — N400 Enlarged prostate without lower urinary tract symptoms: Secondary | ICD-10-CM | POA: Diagnosis not present

## 2018-06-01 DIAGNOSIS — I10 Essential (primary) hypertension: Secondary | ICD-10-CM | POA: Diagnosis not present

## 2018-06-01 DIAGNOSIS — I959 Hypotension, unspecified: Secondary | ICD-10-CM | POA: Diagnosis not present

## 2018-06-01 DIAGNOSIS — M869 Osteomyelitis, unspecified: Secondary | ICD-10-CM | POA: Diagnosis not present

## 2018-06-01 DIAGNOSIS — M4628 Osteomyelitis of vertebra, sacral and sacrococcygeal region: Principal | ICD-10-CM | POA: Diagnosis present

## 2018-06-01 DIAGNOSIS — D509 Iron deficiency anemia, unspecified: Secondary | ICD-10-CM | POA: Diagnosis not present

## 2018-06-01 DIAGNOSIS — G8220 Paraplegia, unspecified: Secondary | ICD-10-CM | POA: Diagnosis not present

## 2018-06-01 DIAGNOSIS — N319 Neuromuscular dysfunction of bladder, unspecified: Secondary | ICD-10-CM | POA: Diagnosis not present

## 2018-06-01 DIAGNOSIS — L89154 Pressure ulcer of sacral region, stage 4: Secondary | ICD-10-CM | POA: Diagnosis present

## 2018-06-01 DIAGNOSIS — Z7401 Bed confinement status: Secondary | ICD-10-CM | POA: Diagnosis not present

## 2018-06-01 DIAGNOSIS — M8618 Other acute osteomyelitis, other site: Secondary | ICD-10-CM | POA: Diagnosis not present

## 2018-06-01 DIAGNOSIS — Z95828 Presence of other vascular implants and grafts: Secondary | ICD-10-CM | POA: Diagnosis not present

## 2018-06-01 DIAGNOSIS — R110 Nausea: Secondary | ICD-10-CM | POA: Diagnosis present

## 2018-06-01 DIAGNOSIS — D649 Anemia, unspecified: Secondary | ICD-10-CM | POA: Diagnosis not present

## 2018-06-01 DIAGNOSIS — N401 Enlarged prostate with lower urinary tract symptoms: Secondary | ICD-10-CM | POA: Diagnosis not present

## 2018-06-01 DIAGNOSIS — D638 Anemia in other chronic diseases classified elsewhere: Secondary | ICD-10-CM | POA: Diagnosis present

## 2018-06-01 DIAGNOSIS — M2550 Pain in unspecified joint: Secondary | ICD-10-CM | POA: Diagnosis not present

## 2018-06-01 DIAGNOSIS — M6281 Muscle weakness (generalized): Secondary | ICD-10-CM | POA: Diagnosis not present

## 2018-06-01 HISTORY — DX: Pressure ulcer of sacral region, unspecified stage: L89.159

## 2018-06-01 LAB — COMPREHENSIVE METABOLIC PANEL
ALT: 8 U/L (ref 0–44)
AST: 14 U/L — ABNORMAL LOW (ref 15–41)
Albumin: 2.8 g/dL — ABNORMAL LOW (ref 3.5–5.0)
Alkaline Phosphatase: 69 U/L (ref 38–126)
Anion gap: 9 (ref 5–15)
BUN: 11 mg/dL (ref 8–23)
CHLORIDE: 101 mmol/L (ref 98–111)
CO2: 27 mmol/L (ref 22–32)
CREATININE: 0.44 mg/dL — AB (ref 0.61–1.24)
Calcium: 8.5 mg/dL — ABNORMAL LOW (ref 8.9–10.3)
GFR calc non Af Amer: >60 mL/min (ref >60)
Glucose, Bld: 96 mg/dL (ref 70–99)
Potassium: 3.8 mmol/L (ref 3.5–5.1)
SODIUM: 137 mmol/L (ref 135–145)
Total Bilirubin: 0.2 mg/dL — ABNORMAL LOW (ref 0.3–1.2)
Total Protein: 7.7 g/dL (ref 6.5–8.1)

## 2018-06-01 LAB — CBC
HCT: 34 % — ABNORMAL LOW (ref 39.0–52.0)
Hemoglobin: 9.6 g/dL — ABNORMAL LOW (ref 13.0–17.0)
MCH: 19.6 pg — ABNORMAL LOW (ref 26.0–34.0)
MCHC: 28.2 g/dL — ABNORMAL LOW (ref 30.0–36.0)
MCV: 69.4 fL — ABNORMAL LOW (ref 80.0–100.0)
PLATELETS: 591 10*3/uL — AB (ref 150–400)
RBC: 4.9 MIL/uL (ref 4.22–5.81)
RDW: 18.5 % — ABNORMAL HIGH (ref 11.5–15.5)
WBC: 9.6 10*3/uL (ref 4.0–10.5)
nRBC: 0 % (ref 0.0–0.2)

## 2018-06-01 LAB — CG4 I-STAT (LACTIC ACID): LACTIC ACID, VENOUS: 1.2 mmol/L (ref 0.5–1.9)

## 2018-06-01 LAB — SEDIMENTATION RATE: Sed Rate: 52 mm/hr — ABNORMAL HIGH (ref 0–20)

## 2018-06-01 MED ORDER — ONDANSETRON HCL 4 MG PO TABS
4.0000 mg | ORAL_TABLET | Freq: Four times a day (QID) | ORAL | Status: DC | PRN
  Administered 2018-06-06 (×2): 4 mg via ORAL
  Filled 2018-06-01 (×2): qty 1

## 2018-06-01 MED ORDER — OXYCODONE HCL 5 MG PO TABS: 5.0000 mg | ORAL_TABLET | ORAL | Status: DC | PRN

## 2018-06-01 MED ORDER — ACETAMINOPHEN 325 MG PO TABS
650.0000 mg | ORAL_TABLET | Freq: Four times a day (QID) | ORAL | Status: DC | PRN
  Filled 2018-06-01: qty 2

## 2018-06-01 MED ORDER — ACETAMINOPHEN 650 MG RE SUPP: 650.0000 mg | Freq: Four times a day (QID) | RECTAL | Status: DC | PRN

## 2018-06-01 MED ORDER — ONDANSETRON HCL 4 MG/2ML IJ SOLN
4.0000 mg | Freq: Four times a day (QID) | INTRAMUSCULAR | Status: DC | PRN
  Administered 2018-06-06 (×2): 4 mg via INTRAVENOUS
  Filled 2018-06-01 (×2): qty 2

## 2018-06-01 MED ORDER — ENOXAPARIN SODIUM 40 MG/0.4ML ~~LOC~~ SOLN
40.0000 mg | SUBCUTANEOUS | Status: DC
  Administered 2018-06-03: 40 mg via SUBCUTANEOUS
  Filled 2018-06-01: qty 0.4

## 2018-06-01 NOTE — ED Provider Notes (Signed)
[facility] Emergency Department Provider Note    First MD Initiated Contact with Patient 06/01/18 2125     (approximate)  I have reviewed the triage vital signs and the nursing notes.   HISTORY  Chief Complaint Wound Infection    HPI Bradley [name] is a 72 y.o. male history of paraplegia as well as osteomyelitis not currently on any antibiotics with large stage IV sacral decubitus ulcer presents the ER after having MRI done earlier this week showing evidence of sacral osteomyelitis.  Patient directed to the ER by PCP for IV antibiotics.  States he denies any worsening symptoms.  No fevers.  No nausea or vomiting.  States that per report from wound care his wound has been improving.    Past Medical History:  Diagnosis Date  . Anemia   . Arthritis   . Paralysis of both lower limbs ([place])   . Sacral decubitus ulcer    Family History  Problem Relation Age of Onset  . Breast cancer Mother    Past Surgical History:  Procedure Laterality Date  . COLONOSCOPY  06/06/2014  . TONSILLECTOMY     Patient Active Problem List   Diagnosis Date Noted  . Sacral decubitus ulcer 06/01/2018  . Osteomyelitis ([place]) 06/01/2018  . Paraplegia ([place]) 06/01/2018  . Iron deficiency anemia 04/19/2017      Prior to Admission medications   Medication Sig Start Date End Date Taking? Authorizing Provider  acetaminophen (TYLENOL) 500 MG tablet Take by mouth.    [provider]  amLODipine (NORVASC) 5 MG tablet Take 5 mg by mouth daily. 08/29/16   [provider]  baclofen (LIORESAL) 10 MG tablet Take 20 mg by mouth 3 (three) times daily. 11/30/16   [provider]  [employer] ZINC 50 MG TABS Take 50 mg by mouth 2 (two) times daily.    [provider]  ferrous sulfate 325 (65 FE) MG tablet Take 325 mg by mouth 2 (two) times daily.    [provider]  finasteride (PROSCAR) 5 MG tablet Take 5 mg by mouth daily. 08/29/16   [provider]  Multiple Vitamin (MULTI-VITAMINS) TABS Take by mouth.    [provider]  polyethylene glycol powder (GLYCOLAX/MIRALAX) powder Take by mouth.    [provider]  vitamin C (ASCORBIC ACID) 500 MG tablet Take 500 mg by mouth 2 (two) times daily.    [provider]    Allergies Patient has no known allergies.    Social History Social History   Tobacco Use  . Smoking status: Former Smoker    Packs/day: 0.50    Years: 5.00    Pack years: 2.50    Last attempt to quit: 1972    Years since quitting: 48.0  . Smokeless tobacco: Never Used  Substance Use Topics  . Alcohol use: No  . Drug use: No    Review of Systems Patient denies headaches, rhinorrhea, blurry vision, numbness, shortness of breath, chest pain, edema, cough, abdominal pain, nausea, vomiting, diarrhea, dysuria, fevers, rashes or hallucinations unless otherwise stated above in HPI. ____________________________________________   PHYSICAL EXAM:  VITAL SIGNS: Vitals:   06/01/18 1916 06/01/18 2313  BP: 124/63 (!) 141/86  Pulse: 76 85  Resp: 20 20  Temp: 97.8 F (36.6 C)   SpO2: 99% 100%    Constitutional: Alert and oriented.  Eyes: Conjunctivae are normal.  Head: Atraumatic. Nose: No congestion/rhinnorhea. Mouth/Throat: Mucous membranes are moist.   Neck: No stridor. Painless  ROM.  Cardiovascular: Normal rate, regular rhythm. Grossly normal heart sounds.  Good peripheral circulation. Respiratory: Normal respiratory effort.  No retractions. Lungs CTAB. Gastrointestinal: Soft and nontender.  Ostomy site appears c/d/i No distention. No abdominal bruits. No CVA tenderness. Genitourinary:  Musculoskeletal: Large stage IV sacral decubitus ulcer.   Neurologic:  Normal speech and language.  Chronic paraplegia Skin:  Skin is warm, dry and intact. No rash noted. Psychiatric: Mood and affect are normal. Speech and behavior are normal.  ____________________________________________    LABS (all labs ordered are listed, but only abnormal results are displayed)  Results for orders placed or performed during the hospital encounter of 06/01/18 (from the past 24 hour(s))  CBC     Status: Abnormal   Collection Time: 06/01/18  7:33 PM  Result Value Ref Range   WBC 9.6 4.0 - 10.5 K/uL   RBC 4.90 4.22 - 5.81 MIL/uL   Hemoglobin 9.6 (L) 13.0 - 17.0 g/dL   HCT 34.0 (L) 39.0 - 52.0 %   MCV 69.4 (L) 80.0 - 100.0 fL   MCH 19.6 (L) 26.0 - 34.0 pg   MCHC 28.2 (L) 30.0 - 36.0 g/dL   RDW 18.5 (H) 11.5 - 15.5 %   Platelets 591 (H) 150 - 400 K/uL   nRBC 0.0 0.0 - 0.2 %  Comprehensive metabolic panel     Status: Abnormal   Collection Time: 06/01/18  7:33 PM  Result Value Ref Range   Sodium 137 135 - 145 mmol/L   Potassium 3.8 3.5 - 5.1 mmol/L   Chloride 101 98 - 111 mmol/L   CO2 27 22 - 32 mmol/L   Glucose, Bld 96 70 - 99 mg/dL   BUN 11 8 - 23 mg/dL   Creatinine, Ser 0.44 (L) 0.61 - 1.24 mg/dL   Calcium 8.5 (L) 8.9 - 10.3 mg/dL   Total Protein 7.7 6.5 - 8.1 g/dL   Albumin 2.8 (L) 3.5 - 5.0 g/dL   AST 14 (L) 15 - 41 U/L   ALT 8 0 - 44 U/L   Alkaline Phosphatase 69 38 - 126 U/L   Total Bilirubin 0.2 (L) 0.3 - 1.2 mg/dL   GFR calc non Af Amer >60 >60 mL/min   GFR calc Af Amer >60 >60 mL/min   Anion gap 9 5 - 15  CG4 I-STAT (Lactic acid)     Status: None   Collection Time: 06/01/18  7:45 PM  Result Value Ref Range   Lactic Acid, Venous 1.20 0.5 - 1.9 mmol/L  Sedimentation rate     Status: Abnormal   Collection Time: 06/01/18  9:43 PM  Result Value Ref Range   Sed Rate 52 (H) 0 - 20 mm/hr   _____________________________________________________  RADIOLOGY   ____________________________________________   PROCEDURES  Procedure(s) performed:  Procedures    Critical Care performed: no ____________________________________________   INITIAL IMPRESSION / ASSESSMENT AND PLAN / ED COURSE  Pertinent labs & imaging results that were available during my care of the  patient were reviewed by me and considered in my medical decision making (see chart for details).   DDX: Osteomyelitis, cellulitis, sepsis  Bradley [name] is a 72 y.o. who presents to the ED with symptoms as described above.  Patient with large sacral decubitus ulcer.  Blood work sent for the above differential does not show any evidence of sepsis but does have MRI showing evidence of osteo-.  Will consult with ID for further recommendations will consult with hospitalist for admission.  Clinical  Course as of Jun 02 2343  Fri Jun 01, 2018  2149 I consulted with Dr. Steva Ready of ID who agrees with holding antibiotics at this time as the patient is not septic with plan for biopsy and initiation of antibiotics after biopsy.  Patient will require hospitalization.   [PR]    Clinical Course User Index [PR] Merlyn Lot, MD     As part of my medical decision making, I reviewed the following data within the [place] notes reviewed and incorporated, Labs reviewed, notes from prior ED visits.   ____________________________________________   FINAL CLINICAL IMPRESSION(S) / ED DIAGNOSES  Final diagnoses:  Other acute osteomyelitis, other site [facility])      NEW MEDICATIONS STARTED DURING THIS VISIT:  New Prescriptions   No medications on file     Note:  This document was prepared using Dragon voice recognition software and may include unintentional dictation errors.    Merlyn Lot, MD 06/01/18 [id number]

## 2018-06-01 NOTE — H&P (Signed)
[facility] NAME: Bradley [name]    MR#:  [id number]  DATE OF BIRTH:  1945-07-16  DATE OF ADMISSION:  06/01/2018  PRIMARY CARE PHYSICIAN: [name], MD   REQUESTING/REFERRING PHYSICIAN: Quentin Cornwall, MD  CHIEF COMPLAINT:   Chief Complaint  Patient presents with  . Wound Infection    HISTORY OF PRESENT ILLNESS:  Bradley [name]  is a 72 y.o. male who presents with chief complaint as above.  Patient is paraparetic in his lower extremities and has a sacral decubitus ulcer.  He has been treated for osteomyelitis in that region multiple times in the past, in the recent past according to him.  He had repeat imaging done by his PCP as follow-up and it was concerning for persistent/recurrent osteomyelitis.  He came to ED for evaluation.  Infectious disease specialist contacted by ED physician and recommends bone biopsy for definitive clarification of whether or not he truly has infection remaining in the bone.  Hospitalist were called for admission  PAST MEDICAL HISTORY:   Past Medical History:  Diagnosis Date  . Anemia   . Arthritis   . Paralysis of both lower limbs ([place])   . Sacral decubitus ulcer      PAST SURGICAL HISTORY:   Past Surgical History:  Procedure Laterality Date  . COLONOSCOPY  06/06/2014  . TONSILLECTOMY       SOCIAL HISTORY:   Social History   Tobacco Use  . Smoking status: Former Smoker    Packs/day: 0.50    Years: 5.00    Pack years: 2.50    Last attempt to quit: 1972    Years since quitting: 48.0  . Smokeless tobacco: Never Used  Substance Use Topics  . Alcohol use: No     FAMILY HISTORY:   Family History  Problem Relation Age of Onset  . Breast cancer Mother      DRUG ALLERGIES:  No Known Allergies  MEDICATIONS AT HOME:   Prior to Admission medications   Medication Sig Start Date End Date Taking? Authorizing Provider  acetaminophen (TYLENOL) 500 MG tablet Take by mouth.     [provider]  amLODipine (NORVASC) 5 MG tablet Take 5 mg by mouth daily. 08/29/16   [provider]  baclofen (LIORESAL) 10 MG tablet Take 20 mg by mouth 3 (three) times daily. 11/30/16   [provider]  [employer] ZINC 50 MG TABS Take 50 mg by mouth 2 (two) times daily.    [provider]  ferrous sulfate 325 (65 FE) MG tablet Take 325 mg by mouth 2 (two) times daily.    [provider]  finasteride (PROSCAR) 5 MG tablet Take 5 mg by mouth daily. 08/29/16   [provider]  Multiple Vitamin (MULTI-VITAMINS) TABS Take by mouth.    [provider]  polyethylene glycol powder (GLYCOLAX/MIRALAX) powder Take by mouth.    [provider]  vitamin C (ASCORBIC ACID) 500 MG tablet Take 500 mg by mouth 2 (two) times daily.    [provider]    REVIEW OF SYSTEMS:  Review of Systems  Constitutional: Negative for chills, fever, malaise/fatigue and weight loss.  HENT: Negative for ear pain, hearing loss and tinnitus.   Eyes: Negative for blurred vision, double vision, pain and redness.  Respiratory: Negative for cough, hemoptysis and shortness of breath.   Cardiovascular: Negative for chest pain, palpitations, orthopnea and leg swelling.  Gastrointestinal: Negative for abdominal pain, constipation, diarrhea, nausea  and vomiting.  Genitourinary: Negative for dysuria, frequency and hematuria.  Musculoskeletal: Negative for back pain, joint pain and neck pain.  Skin:       Sacral decubitus ulcer  Neurological: Negative for dizziness, tremors, focal weakness and weakness.  Endo/Heme/Allergies: Negative for polydipsia. Does not bruise/bleed easily.  Psychiatric/Behavioral: Negative for depression. The patient is not nervous/anxious and does not have insomnia.      VITAL SIGNS:   Vitals:   06/01/18 1916  BP: 124/63  Pulse: 76  Resp: 20  Temp: 97.8 F (36.6 C)  TempSrc: Oral  SpO2: 99%  Weight: 93.4 kg   Wt Readings  from Last 3 Encounters:  06/01/18 93.4 kg  08/25/17 94.8 kg  04/12/17 93.4 kg    PHYSICAL EXAMINATION:  Physical Exam  Vitals reviewed. Constitutional: He is oriented to person, place, and time. He appears well-developed and well-nourished. No distress.  HENT:  Head: Normocephalic and atraumatic.  Mouth/Throat: Oropharynx is clear and moist.  Eyes: Pupils are equal, round, and reactive to light. Conjunctivae and EOM are normal. No scleral icterus.  Neck: Normal range of motion. Neck supple. No JVD present. No thyromegaly present.  Cardiovascular: Normal rate, regular rhythm and intact distal pulses. Exam reveals no gallop and no friction rub.  No murmur heard. Respiratory: Effort normal and breath sounds normal. No respiratory distress. He has no wheezes. He has no rales.  GI: Soft. Bowel sounds are normal. He exhibits no distension. There is no abdominal tenderness.  Musculoskeletal: Normal range of motion.        General: No edema.     Comments: No arthritis, no gout  Lymphadenopathy:    He has no cervical adenopathy.  Neurological: He is alert and oriented to person, place, and time. No cranial nerve deficit.  No dysarthria, no aphasia  Skin: Skin is warm and dry. No rash noted. No erythema.  Sacral decubitus ulcer  Psychiatric: He has a normal mood and affect. His behavior is normal. Judgment and thought content normal.    LABORATORY PANEL:   CBC Recent Labs  Lab 06/01/18 1933  WBC 9.6  HGB 9.6*  HCT 34.0*  PLT 591*   ------------------------------------------------------------------------------------------------------------------  Chemistries  Recent Labs  Lab 06/01/18 1933  NA 137  K 3.8  CL 101  CO2 27  GLUCOSE 96  BUN 11  CREATININE 0.44*  CALCIUM 8.5*  AST 14*  ALT 8  ALKPHOS 69  BILITOT 0.2*   ------------------------------------------------------------------------------------------------------------------  Cardiac Enzymes No results for  input(s): TROPONINI in the last 168 hours. ------------------------------------------------------------------------------------------------------------------  RADIOLOGY:  No results found.  EKG:  No orders found for this or any previous visit.  IMPRESSION AND PLAN:  Principal Problem:   Osteomyelitis ([place]) -patient does not have other signs of infection at this time.  We will hold off on antibiotics for now.  General surgery consult for bone biopsy.  Infectious disease consult placed Active Problems:   Sacral decubitus ulcer -wound consult placed, other work-up as above   Paraplegia ([place]) -continue home medications for symptom control  Chart review performed and case discussed with ED provider. Labs, imaging and/or ECG reviewed by provider and discussed with patient/family. Management plans discussed with the patient and/or family.  DVT PROPHYLAXIS: SubQ lovenox   GI PROPHYLAXIS:  None  ADMISSION STATUS: Observation  CODE STATUS: Full  TOTAL TIME TAKING CARE OF THIS PATIENT: 40 minutes.   Meriam Chojnowski FIELDING 06/01/2018, 10:56 PM  [employer]  [phone]  CC: Primary care  physician; [name], MD  Note:  This document was prepared using Dragon voice recognition software and may include unintentional dictation errors.
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
## 2018-06-01 NOTE — ED Notes (Signed)
ED TO INPATIENT HANDOFF REPORT  Name/Age/Gender Bradley [name] 72 y.o. male  Code Status   Home/SNF/Other Home  Chief Complaint Referred by Physician  Level of Care/Admitting Diagnosis ED Disposition    ED Disposition Condition [place]: [employer] [100120]  Level of Care: Med-Surg [16]  Diagnosis: Osteomyelitis [facility]) [725366]  Admitting Physician: Lance Coon [[id number]]  Attending Physician: Lance Coon [phone]  PT Class (Do Not Modify): Observation [104]  PT Acc Code (Do Not Modify): Observation [10022]       Medical History Past Medical History:  Diagnosis Date  . Anemia   . Arthritis   . Paralysis of both lower limbs ([place])   . Sacral decubitus ulcer     Allergies No Known Allergies  IV Location/Drains/Wounds Patient Lines/Drains/Airways Status   Active Line/Drains/Airways    Name:   Placement date:   Placement time:   Site:   Days:   Peripheral IV 06/01/18 Right Antecubital   06/01/18    2220    Antecubital   less than 1          Labs/Imaging Results for orders placed or performed during the hospital encounter of 06/01/18 (from the past 48 hour(s))  CBC     Status: Abnormal   Collection Time: 06/01/18  7:33 PM  Result Value Ref Range   WBC 9.6 4.0 - 10.5 K/uL   RBC 4.90 4.22 - 5.81 MIL/uL   Hemoglobin 9.6 (L) 13.0 - 17.0 g/dL   HCT 34.0 (L) 39.0 - 52.0 %   MCV 69.4 (L) 80.0 - 100.0 fL   MCH 19.6 (L) 26.0 - 34.0 pg   MCHC 28.2 (L) 30.0 - 36.0 g/dL   RDW 18.5 (H) 11.5 - 15.5 %   Platelets 591 (H) 150 - 400 K/uL   nRBC 0.0 0.0 - 0.2 %    Comment: Performed at [facility], [address] [place], [place] [address]  Comprehensive metabolic panel     Status: Abnormal   Collection Time: 06/01/18  7:33 PM  Result Value Ref Range   Sodium 137 135 - 145 mmol/L   Potassium 3.8 3.5 - 5.1 mmol/L   Chloride 101 98 - 111 mmol/L   CO2 27 22 - 32 mmol/L   Glucose, Bld 96 70 - 99 mg/dL   BUN 11  8 - 23 mg/dL   Creatinine, Ser 0.44 (L) 0.61 - 1.24 mg/dL   Calcium 8.5 (L) 8.9 - 10.3 mg/dL   Total Protein 7.7 6.5 - 8.1 g/dL   Albumin 2.8 (L) 3.5 - 5.0 g/dL   AST 14 (L) 15 - 41 U/L   ALT 8 0 - 44 U/L   Alkaline Phosphatase 69 38 - 126 U/L   Total Bilirubin 0.2 (L) 0.3 - 1.2 mg/dL   GFR calc non Af Amer >60 >60 mL/min   GFR calc Af Amer >60 >60 mL/min   Anion gap 9 5 - 15    Comment: Performed at [facility], [address] [place], [place] [address]  CG4 I-STAT (Lactic acid)     Status: None   Collection Time: 06/01/18  7:45 PM  Result Value Ref Range   Lactic Acid, Venous 1.20 0.5 - 1.9 mmol/L   No results found.  Pending Labs Unresulted Labs (From admission, onward)    Start     Ordered   06/01/18 2143  C-reactive protein  Once,   STAT     06/01/18 2142  06/01/18 2143  Sedimentation rate  ONCE - STAT,   STAT     06/01/18 2142   06/01/18 1922  Blood culture (routine x 2)  BLOOD CULTURE X 2,   STAT     06/01/18 1922   Signed and Held  CBC  (enoxaparin (LOVENOX)    CrCl >/= 30 ml/min)  Once,   R    Comments:  Baseline for enoxaparin therapy IF NOT ALREADY DRAWN.  Notify MD if PLT < 100 K.    Signed and Held   Signed and Held  Creatinine, serum  (enoxaparin (LOVENOX)    CrCl >/= 30 ml/min)  Once,   R    Comments:  Baseline for enoxaparin therapy IF NOT ALREADY DRAWN.    Signed and Held   Signed and Held  Creatinine, serum  (enoxaparin (LOVENOX)    CrCl >/= 30 ml/min)  Weekly,   R    Comments:  while on enoxaparin therapy    Signed and Held   Signed and Held  Basic metabolic panel  Tomorrow morning,   R     Signed and Held   Signed and Held  CBC  Tomorrow morning,   R     Signed and Held          Vitals/Pain Today's Vitals   06/01/18 1916  BP: 124/63  Pulse: 76  Resp: 20  Temp: 97.8 F (36.6 C)  TempSrc: Oral  SpO2: 99%  Weight: 93.4 kg  PainSc: 0-No pain    Isolation Precautions No active isolations  Medications Medications - No data  to display  Mobility non-ambulatory

## 2018-06-02 DIAGNOSIS — N319 Neuromuscular dysfunction of bladder, unspecified: Secondary | ICD-10-CM

## 2018-06-02 DIAGNOSIS — Z79899 Other long term (current) drug therapy: Secondary | ICD-10-CM

## 2018-06-02 DIAGNOSIS — G8220 Paraplegia, unspecified: Secondary | ICD-10-CM

## 2018-06-02 DIAGNOSIS — L89159 Pressure ulcer of sacral region, unspecified stage: Secondary | ICD-10-CM | POA: Diagnosis not present

## 2018-06-02 DIAGNOSIS — D649 Anemia, unspecified: Secondary | ICD-10-CM

## 2018-06-02 DIAGNOSIS — M4628 Osteomyelitis of vertebra, sacral and sacrococcygeal region: Principal | ICD-10-CM

## 2018-06-02 DIAGNOSIS — N400 Enlarged prostate without lower urinary tract symptoms: Secondary | ICD-10-CM

## 2018-06-02 DIAGNOSIS — Z933 Colostomy status: Secondary | ICD-10-CM

## 2018-06-02 DIAGNOSIS — I10 Essential (primary) hypertension: Secondary | ICD-10-CM

## 2018-06-02 DIAGNOSIS — Z87891 Personal history of nicotine dependence: Secondary | ICD-10-CM

## 2018-06-02 DIAGNOSIS — M869 Osteomyelitis, unspecified: Secondary | ICD-10-CM | POA: Diagnosis not present

## 2018-06-02 DIAGNOSIS — L89154 Pressure ulcer of sacral region, stage 4: Secondary | ICD-10-CM

## 2018-06-02 LAB — CBC
HEMATOCRIT: 32.2 % — AB (ref 39.0–52.0)
HEMOGLOBIN: 9.2 g/dL — AB (ref 13.0–17.0)
MCH: 19.4 pg — ABNORMAL LOW (ref 26.0–34.0)
MCHC: 28.6 g/dL — ABNORMAL LOW (ref 30.0–36.0)
MCV: 67.9 fL — AB (ref 80.0–100.0)
Platelets: 499 10*3/uL — ABNORMAL HIGH (ref 150–400)
RBC: 4.74 MIL/uL (ref 4.22–5.81)
RDW: 18.2 % — ABNORMAL HIGH (ref 11.5–15.5)
WBC: 6.1 10*3/uL (ref 4.0–10.5)
nRBC: 0 % (ref 0.0–0.2)

## 2018-06-02 LAB — BASIC METABOLIC PANEL
Anion gap: 6 (ref 5–15)
BUN: 10 mg/dL (ref 8–23)
CO2: 26 mmol/L (ref 22–32)
Calcium: 8.3 mg/dL — ABNORMAL LOW (ref 8.9–10.3)
Chloride: 107 mmol/L (ref 98–111)
Creatinine, Ser: 0.43 mg/dL — ABNORMAL LOW (ref 0.61–1.24)
GFR calc Af Amer: >60 mL/min (ref >60)
GFR calc non Af Amer: >60 mL/min (ref >60)
Glucose, Bld: 113 mg/dL — ABNORMAL HIGH (ref 70–99)
POTASSIUM: 3.6 mmol/L (ref 3.5–5.1)
Sodium: 139 mmol/L (ref 135–145)

## 2018-06-02 LAB — C-REACTIVE PROTEIN: CRP: 8.1 mg/dL — ABNORMAL HIGH (ref <1.0)

## 2018-06-02 MED ORDER — DAKINS (1/4 STRENGTH) 0.125 % EX SOLN
Freq: Three times a day (TID) | CUTANEOUS | Status: AC
  Administered 2018-06-02 – 2018-06-05 (×6)
  Filled 2018-06-02 (×3): qty 473

## 2018-06-02 NOTE — Consult Note (Signed)
[facility] Nurse wound consult note Reason for Consult: chronic, slowly healing Sacral Stage 4 pressure injury.  Followed by the outpatient [facility] at [facility].  Has also been seen by Plastics at [facility].  Followed by [facility] in the community. Wound type: Pressure Pressure Injury POA: Yes Measurement: 11cm x 11cm x 4.4cm. No tunneling or undermining. Wound bed: pale red, moist, contracting at edges Drainage (amount, consistency, odor) Moderate amount light yellow exudate on old dressing Periwound: macerated Dressing procedure/placement/frequency: Infectious disease MD was in earlier today and has recommended three days of sodium hypochlorite (Dakins) solution used in a wet-to-dry format and then transition back to a silver hydrofiber (Aquacel Ag+) as he was using at home. We will implement the Dakins for three days, three times daily. I will provide a mattress replacement with low air loss feature as well (patient uses a [place], air fluidized bed at home, but this is a recent upgrade).  [place] Nurse ostomy consult note Stoma type/location: LLQ colostomy Stomal assessment/size: (Viewed through intact pouch) approximately 1 and 3/8 inches round, slightly prolapsed.   Peristomal assessment: Peristomal hernia present Treatment options for stomal/peristomal skin: Patient is wearing a hernial support belt albeit one whose elasticity is badly worn.  We do not provide these; patient is aware of how to replace, but noted they are expensive. Output: soft brown stool Ostomy pouching: 2pc.2 and 1/4 inch pouching system  Education provided: None.  Patient is independent in ostomy care, changes pouching system twice weekly.  Appreciates the provision of supplies to be used while in house. Enrolled patient in [place] program: Yes/No   [facility] nursing team will not follow, but will remain available to this patient, the nursing and medical teams.  Please re-consult if needed. Thanks, Maudie Flakes, MSN, RN, [place], Ar[facility] Abbott  Pager# [phone]
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
## 2018-06-02 NOTE — Care Management Obs Status (Signed)
[place] NOTIFICATION   Patient Details  Name: LAMARIO MANI MRN: [id number] Date of Birth: [birth year]   Medicare Observation Status Notification Given:  Yes    Debarah Mccumbers A Gwendlyon Zumbro, RN 06/02/2018, 9:39 AM

## 2018-06-02 NOTE — Progress Notes (Signed)
06/02/18  General surgery received consult for patient Camara Rosander for bone biopsy request.  Patient has a sacral decubitus ulcer and is being treated for osteomyelitis in the past.  He was admitted on 12/27.  Had an MRI of pelvis on 12/20 which shows a large sacral decubitus ulcer with osteomyelitis in the sacrum.  ID team has requested a bone biopsy of the sacrum to check if he truly has bone infection still.  Discussed case with Dr. Tressia Miners and that I have not done a bone biopsy before and recommended consultation with Orthopaedics.  Dr. Rudene Christians does bone biopsies but he's not available this weekend.  Medical team will consult him on Monday for a bone biopsy.  General surgery will sign off for now.  Please let us know if any issues or concerns.   Olean Ree, MD
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
## 2018-06-03 DIAGNOSIS — Z993 Dependence on wheelchair: Secondary | ICD-10-CM | POA: Diagnosis not present

## 2018-06-03 DIAGNOSIS — N319 Neuromuscular dysfunction of bladder, unspecified: Secondary | ICD-10-CM | POA: Diagnosis present

## 2018-06-03 DIAGNOSIS — Z79899 Other long term (current) drug therapy: Secondary | ICD-10-CM | POA: Diagnosis not present

## 2018-06-03 DIAGNOSIS — L89154 Pressure ulcer of sacral region, stage 4: Secondary | ICD-10-CM | POA: Diagnosis present

## 2018-06-03 DIAGNOSIS — D638 Anemia in other chronic diseases classified elsewhere: Secondary | ICD-10-CM | POA: Diagnosis present

## 2018-06-03 DIAGNOSIS — M4628 Osteomyelitis of vertebra, sacral and sacrococcygeal region: Secondary | ICD-10-CM | POA: Diagnosis present

## 2018-06-03 DIAGNOSIS — R110 Nausea: Secondary | ICD-10-CM | POA: Diagnosis present

## 2018-06-03 DIAGNOSIS — Z87891 Personal history of nicotine dependence: Secondary | ICD-10-CM | POA: Diagnosis not present

## 2018-06-03 DIAGNOSIS — D649 Anemia, unspecified: Secondary | ICD-10-CM | POA: Diagnosis not present

## 2018-06-03 DIAGNOSIS — Z933 Colostomy status: Secondary | ICD-10-CM | POA: Diagnosis not present

## 2018-06-03 DIAGNOSIS — T373X5A Adverse effect of other antiprotozoal drugs, initial encounter: Secondary | ICD-10-CM | POA: Diagnosis present

## 2018-06-03 DIAGNOSIS — I10 Essential (primary) hypertension: Secondary | ICD-10-CM | POA: Diagnosis present

## 2018-06-03 DIAGNOSIS — N400 Enlarged prostate without lower urinary tract symptoms: Secondary | ICD-10-CM | POA: Diagnosis present

## 2018-06-03 DIAGNOSIS — R532 Functional quadriplegia: Secondary | ICD-10-CM | POA: Diagnosis present

## 2018-06-03 DIAGNOSIS — G8220 Paraplegia, unspecified: Secondary | ICD-10-CM | POA: Diagnosis not present

## 2018-06-03 MED ORDER — VANCOMYCIN HCL IN DEXTROSE 1-5 GM/200ML-% IV SOLN
1000.0000 mg | Freq: Once | INTRAVENOUS | Status: AC
  Administered 2018-06-03: 1000 mg via INTRAVENOUS
  Filled 2018-06-03: qty 200

## 2018-06-03 MED ORDER — VANCOMYCIN HCL 10 G IV SOLR
1500.0000 mg | Freq: Two times a day (BID) | INTRAVENOUS | Status: DC
  Administered 2018-06-03 – 2018-06-05 (×5): 1500 mg via INTRAVENOUS
  Filled 2018-06-03 (×6): qty 1500

## 2018-06-03 MED ORDER — SODIUM CHLORIDE 0.9 % IV SOLN
2.0000 g | Freq: Two times a day (BID) | INTRAVENOUS | Status: DC
  Administered 2018-06-03 – 2018-06-08 (×10): 2 g via INTRAVENOUS
  Filled 2018-06-03 (×12): qty 2

## 2018-06-03 MED ORDER — METRONIDAZOLE IN NACL 5-0.79 MG/ML-% IV SOLN
500.0000 mg | Freq: Three times a day (TID) | INTRAVENOUS | Status: DC
  Administered 2018-06-03 – 2018-06-08 (×15): 500 mg via INTRAVENOUS
  Filled 2018-06-03 (×17): qty 100

## 2018-06-03 MED ORDER — SODIUM CHLORIDE 0.9 % IV SOLN
2.0000 g | Freq: Once | INTRAVENOUS | Status: AC
  Administered 2018-06-03: 2 g via INTRAVENOUS
  Filled 2018-06-03: qty 2
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
## 2018-06-03 NOTE — Progress Notes (Signed)
Pharmacy Antibiotic Note  Bradley [name] is a 72 y.o. male admitted on 06/01/2018 with wound infection.  Pharmacy has been consulted for cefepime and vancomycin dosing.  Plan: 1. Cefepime 2 gm IV Q12H 2. Vancomycin 1 gm IV x 1 followed in approximately 6 hours (stacked dosing) by vancomycin 1.5 gm IV Q12H, predicted trough 15 mcg/ml. Pharmacy will continue to follow and adjust as needed to maintain trough 15 to 20 mcg/ml.   Vd 59.2 L, Ke 0.085 hr-1, T1/2 8.2 hr  Height: 6\' 3"  (190.5 cm) Weight: 208 lb 15.9 oz (94.8 kg) IBW/kg (Calculated) : 84.5  Temp (24hrs), Avg:98.4 F (36.9 C), Min:98.1 F (36.7 C), Max:99 F (37.2 C)  Recent Labs  Lab 06/01/18 1933 06/01/18 1945 06/02/18 0522  WBC 9.6  --  6.1  CREATININE 0.44*  --  0.43*  LATICACIDVEN  --  1.20  --     Estimated Creatinine Clearance: 96.8 mL/min (A) (by C-G formula based on SCr of 0.43 mg/dL (L)).    No Known Allergies  Antimicrobials this admission:   Dose adjustments this admission:   Microbiology results:  BCx:   UCx:    Sputum:    MRSA PCR:   Thank you for allowing pharmacy to be a part of this patient's care.  Laural Benes, PharmD, BCPS Clinical Pharmacist 06/03/2018 2:20 PM

## 2018-06-04 ENCOUNTER — Encounter: Payer: Self-pay | Admitting: Certified Registered Nurse Anesthetist

## 2018-06-04 ENCOUNTER — Inpatient Hospital Stay: Payer: Medicare HMO | Admitting: Anesthesiology

## 2018-06-04 ENCOUNTER — Encounter
Admission: EM | Disposition: A | Discharge status: Skilled Nursing Facility | Payer: Self-pay | Source: Home / Self Care | Attending: Internal Medicine

## 2018-06-04 HISTORY — PX: INCISION AND DRAINAGE OF WOUND: SHX1803

## 2018-06-04 LAB — SURGICAL PCR SCREEN
MRSA, PCR: NEGATIVE
Staphylococcus aureus: POSITIVE — AB

## 2018-06-04 SURGERY — IRRIGATION AND DEBRIDEMENT WOUND
Anesthesia: General

## 2018-06-04 MED ORDER — DEXAMETHASONE SODIUM PHOSPHATE 10 MG/ML IJ SOLN
INTRAMUSCULAR | Status: AC
  Filled 2018-06-04: qty 1

## 2018-06-04 MED ORDER — ONDANSETRON HCL 4 MG/2ML IJ SOLN
INTRAMUSCULAR | Status: DC | PRN
  Administered 2018-06-04: 4 mg via INTRAVENOUS

## 2018-06-04 MED ORDER — BACLOFEN 10 MG PO TABS
20.0000 mg | ORAL_TABLET | Freq: Three times a day (TID) | ORAL | Status: DC
  Administered 2018-06-04 – 2018-06-08 (×13): 20 mg via ORAL
  Filled 2018-06-04 (×14): qty 2

## 2018-06-04 MED ORDER — FENTANYL CITRATE (PF) 100 MCG/2ML IJ SOLN
INTRAMUSCULAR | Status: DC | PRN
  Administered 2018-06-04 (×2): 50 ug via INTRAVENOUS

## 2018-06-04 MED ORDER — SUCCINYLCHOLINE CHLORIDE 20 MG/ML IJ SOLN
INTRAMUSCULAR | Status: DC | PRN
  Administered 2018-06-04: 100 mg via INTRAVENOUS

## 2018-06-04 MED ORDER — LACTATED RINGERS IV SOLN
INTRAVENOUS | Status: DC | PRN
  Administered 2018-06-04: 10:00:00 via INTRAVENOUS

## 2018-06-04 MED ORDER — SUCCINYLCHOLINE CHLORIDE 20 MG/ML IJ SOLN
INTRAMUSCULAR | Status: AC
  Filled 2018-06-04: qty 1

## 2018-06-04 MED ORDER — MUPIROCIN 2 % EX OINT
1.0000 "application " | TOPICAL_OINTMENT | Freq: Two times a day (BID) | CUTANEOUS | Status: DC
  Administered 2018-06-04 – 2018-06-08 (×9): 1 via NASAL
  Filled 2018-06-04 (×2): qty 22

## 2018-06-04 MED ORDER — ONDANSETRON HCL 4 MG/2ML IJ SOLN
INTRAMUSCULAR | Status: AC
  Filled 2018-06-04: qty 2

## 2018-06-04 MED ORDER — MIDAZOLAM HCL 2 MG/2ML IJ SOLN
INTRAMUSCULAR | Status: AC
  Filled 2018-06-04: qty 2

## 2018-06-04 MED ORDER — LIDOCAINE HCL (CARDIAC) PF 100 MG/5ML IV SOSY
PREFILLED_SYRINGE | INTRAVENOUS | Status: DC | PRN
  Administered 2018-06-04: 100 mg via INTRAVENOUS

## 2018-06-04 MED ORDER — OXYCODONE HCL 5 MG PO TABS: 5.0000 mg | ORAL_TABLET | Freq: Once | ORAL | Status: DC | PRN

## 2018-06-04 MED ORDER — FINASTERIDE 5 MG PO TABS
5.0000 mg | ORAL_TABLET | Freq: Every day | ORAL | Status: DC
  Administered 2018-06-04 – 2018-06-08 (×5): 5 mg via ORAL
  Filled 2018-06-04 (×5): qty 1

## 2018-06-04 MED ORDER — LIDOCAINE HCL (PF) 2 % IJ SOLN
INTRAMUSCULAR | Status: AC
  Filled 2018-06-04: qty 10

## 2018-06-04 MED ORDER — SODIUM CHLORIDE 0.9 % IV SOLN
INTRAVENOUS | Status: DC | PRN
  Administered 2018-06-04: 250 mL via INTRAVENOUS

## 2018-06-04 MED ORDER — MIDAZOLAM HCL 2 MG/2ML IJ SOLN
INTRAMUSCULAR | Status: DC | PRN
  Administered 2018-06-04: 2 mg via INTRAVENOUS

## 2018-06-04 MED ORDER — ROCURONIUM BROMIDE 50 MG/5ML IV SOLN
INTRAVENOUS | Status: AC
  Filled 2018-06-04: qty 1

## 2018-06-04 MED ORDER — ROCURONIUM BROMIDE 100 MG/10ML IV SOLN
INTRAVENOUS | Status: DC | PRN
  Administered 2018-06-04: 5 mg via INTRAVENOUS

## 2018-06-04 MED ORDER — PROMETHAZINE HCL 25 MG/ML IJ SOLN: 6.2500 mg | INTRAMUSCULAR | Status: DC | PRN

## 2018-06-04 MED ORDER — FENTANYL CITRATE (PF) 100 MCG/2ML IJ SOLN
INTRAMUSCULAR | Status: AC
  Filled 2018-06-04: qty 2

## 2018-06-04 MED ORDER — PROPOFOL 10 MG/ML IV BOLUS
INTRAVENOUS | Status: AC
  Filled 2018-06-04: qty 20

## 2018-06-04 MED ORDER — MEPERIDINE HCL 50 MG/ML IJ SOLN: 6.2500 mg | INTRAMUSCULAR | Status: DC | PRN

## 2018-06-04 MED ORDER — PROPOFOL 10 MG/ML IV BOLUS
INTRAVENOUS | Status: DC | PRN
  Administered 2018-06-04: 170 mg via INTRAVENOUS

## 2018-06-04 MED ORDER — GLYCOPYRROLATE 0.2 MG/ML IJ SOLN
INTRAMUSCULAR | Status: AC
  Filled 2018-06-04: qty 1

## 2018-06-04 MED ORDER — POLYETHYLENE GLYCOL 3350 17 G PO PACK: 17.0000 g | PACK | Freq: Every day | ORAL | Status: DC | PRN

## 2018-06-04 MED ORDER — CHLORHEXIDINE GLUCONATE CLOTH 2 % EX PADS
6.0000 | MEDICATED_PAD | Freq: Every day | CUTANEOUS | Status: AC
  Administered 2018-06-04 – 2018-06-08 (×5): 6 via TOPICAL

## 2018-06-04 MED ORDER — OXYCODONE HCL 5 MG/5ML PO SOLN: 5.0000 mg | Freq: Once | ORAL | Status: DC | PRN

## 2018-06-04 MED ORDER — FENTANYL CITRATE (PF) 100 MCG/2ML IJ SOLN: 25.0000 ug | INTRAMUSCULAR | Status: DC | PRN

## 2018-06-04 MED ORDER — PHENYLEPHRINE HCL 10 MG/ML IJ SOLN
INTRAMUSCULAR | Status: DC | PRN
  Administered 2018-06-04 (×2): 100 ug via INTRAVENOUS
  Administered 2018-06-04: 200 ug via INTRAVENOUS
  Administered 2018-06-04: 100 ug via INTRAVENOUS
  Administered 2018-06-04: 200 ug via INTRAVENOUS
  Administered 2018-06-04: 100 ug via INTRAVENOUS

## 2018-06-04 MED ORDER — DEXAMETHASONE SODIUM PHOSPHATE 10 MG/ML IJ SOLN
INTRAMUSCULAR | Status: DC | PRN
  Administered 2018-06-04: 10 mg via INTRAVENOUS

## 2018-06-04 SURGICAL SUPPLY — 17 items
BNDG GAUZE 4.5X4.1 6PLY STRL (MISCELLANEOUS) ×3 IMPLANT
CHLORAPREP W/TINT 26ML (MISCELLANEOUS) ×3 IMPLANT
COVER WAND RF STERILE (DRAPES) ×3 IMPLANT
DRAIN PENROSE 1/4X12 LTX (DRAIN) ×3 IMPLANT
ELECT REM PT RETURN 9FT ADLT (ELECTROSURGICAL) ×3
ELECTRODE REM PT RTRN 9FT ADLT (ELECTROSURGICAL) ×1 IMPLANT
GAUZE SPONGE 4X4 12PLY STRL (GAUZE/BANDAGES/DRESSINGS) ×3 IMPLANT
GLOVE SURG SYN 7.0 (GLOVE) ×3 IMPLANT
GLOVE SURG SYN 7.5  E (GLOVE) ×2
GLOVE SURG SYN 7.5 E (GLOVE) ×1 IMPLANT
GOWN STRL REUS W/ TWL LRG LVL3 (GOWN DISPOSABLE) ×2 IMPLANT
GOWN STRL REUS W/TWL LRG LVL3 (GOWN DISPOSABLE) ×4
KIT TURNOVER KIT A (KITS) ×3 IMPLANT
LABEL OR SOLS (LABEL) ×3 IMPLANT
NS IRRIG 500ML POUR BTL (IV SOLUTION) ×3 IMPLANT
PACK BASIN MINOR ARMC (MISCELLANEOUS) ×3 IMPLANT
SWAB CULTURE AMIES ANAERIB BLU (MISCELLANEOUS) ×3 IMPLANT

## 2018-06-04 NOTE — Transfer of Care (Signed)
Immediate Anesthesia Transfer of Care Note  Patient: Bradley [name]  Procedure(s) Performed: IRRIGATION AND DEBRIDEMENT WOUND (N/A )  Patient Location: PACU  Anesthesia Type:General  Level of Consciousness: awake and alert   Airway & Oxygen Therapy: Patient Spontanous Breathing and Patient connected to face mask oxygen  Post-op Assessment: Report given to RN and Post -op Vital signs reviewed and stable  Post vital signs: Reviewed and stable  Last Vitals:  Vitals Value Taken Time  BP 123/65 06/04/2018 10:52 AM  Temp 36.4 C 06/04/2018 10:52 AM  Pulse 69 06/04/2018 10:54 AM  Resp 18 06/04/2018 10:54 AM  SpO2 100 % 06/04/2018 10:54 AM  Vitals shown include unvalidated device data.  Last Pain:  Vitals:   06/04/18 0929  TempSrc: Tympanic  PainSc: 0-No pain         Complications: No apparent anesthesia complications

## 2018-06-04 NOTE — Op Note (Signed)
  Procedure Date:  06/04/2018  Pre-operative Diagnosis:   Sacral decubitus ulcer, osteomyelitis  Post-operative Diagnosis:  Sacral decubitus ulcer, osteomyelitis  Procedure:  Open sacrum and coccyx bone biopsy.  Surgeon:  Melvyn Neth, MD  Anesthesia:  General endotracheal  Estimated Blood Loss:  2 ml  Specimens:  Bone tissue for pathology and micro  Complications:  None  Indications for Procedure:  This is a 72 y.o. male with diagnosis of sacral decubitus ulcer that is chronic in nature and has required multiple rounds of IV antibiotics.  He also has osteomyelitis on recent MRI.  The risks of bleeding, abscess or infection, injury to surrounding structures, and need for further procedures were all discussed with the patient and was willing to proceed.  Description of Procedure: The patient was correctly identified in the preoperative area and brought into the operating room.  The patient was placed supine with VTE prophylaxis in place.  Appropriate time-outs were performed.  Anesthesia was induced and the patient was intubated.  Appropriate antibiotics were infused.  The patient was then placed in prone position with appropriate padding.  The patient's sacral area was prepped and draped in usual sterile fashion.  The wound had very healthy granulation tissue at the wound base.  There was mild tunneling of the wound at the right lateral deep portion of about 3 cm as well as superficial midline portion of about 2 cm.  No purulent drainage.  No necrotic tissue or any fibrinous material.  No debridement was needed.  The bone was not exposed in this wound.  There was healthy granulation tissue overlying the coccyx and distal sacrum.  Cautery was used to dissect through the granulation tissue and using a rongeur, bone tissue sample from coccyx and distal sacrum was obtained for both micro and for pathology.  These were sent separately.  Cautery was used to achieve hemostasis after biopsy was  done.  The wound was irrigated and dressed with wet to dry gauze.  The patient was then placed back onto supine position, emerged from anesthesia, extubated, and brought to the recovery room for further management.    The patient tolerated the procedure well and all counts were correct at the end of the case.   Melvyn Neth, MD

## 2018-06-04 NOTE — Anesthesia Postprocedure Evaluation (Signed)
Anesthesia Post Note  Patient: Bradley [name]  Procedure(s) Performed: IRRIGATION AND DEBRIDEMENT WOUND (N/A )  Patient location during evaluation: PACU Anesthesia Type: General Level of consciousness: awake and alert and oriented Pain management: pain level controlled Vital Signs Assessment: post-procedure vital signs reviewed and stable Respiratory status: spontaneous breathing, nonlabored ventilation and respiratory function stable Cardiovascular status: blood pressure returned to baseline and stable Postop Assessment: no signs of nausea or vomiting Anesthetic complications: no     Last Vitals:  Vitals:   06/04/18 1200 06/04/18 1224  BP: (!) 110/51 118/76  Pulse: 70 70  Resp: 17   Temp: (!) 36.4 C 36.4 C  SpO2: 100% 99%    Last Pain:  Vitals:   06/04/18 1224  TempSrc: Oral  PainSc:                  Dezyrae Kensinger

## 2018-06-04 NOTE — Anesthesia Post-op Follow-up Note (Signed)
Anesthesia QCDR form completed.        

## 2018-06-04 NOTE — Anesthesia Procedure Notes (Signed)
Procedure Name: Intubation Date/Time: 06/04/2018 9:52 AM Performed by: Johnna Acosta, CRNA Pre-anesthesia Checklist: Patient identified, Emergency Drugs available, Suction available, Patient being monitored and Timeout performed Patient Re-evaluated:Patient Re-evaluated prior to induction Oxygen Delivery Method: Circle system utilized Preoxygenation: Pre-oxygenation with 100% oxygen Induction Type: IV induction Ventilation: Mask ventilation without difficulty and Oral airway inserted - appropriate to patient size Laryngoscope Size: [name] Heck and 2 Grade View: Grade II Tube type: Oral Tube size: 7.5 mm Number of attempts: 1 Airway Equipment and Method: Stylet and Oral airway Placement Confirmation: ETT inserted through vocal cords under direct vision,  positive ETCO2 and breath sounds checked- equal and bilateral Secured at: 22 cm Tube secured with: Tape Dental Injury: Teeth and Oropharynx as per pre-operative assessment
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
## 2018-06-04 NOTE — Anesthesia Preprocedure Evaluation (Signed)
Anesthesia Evaluation  Patient identified by MRN, date of birth, ID band Patient awake    Reviewed: Allergy & Precautions, NPO status , Patient's Chart, lab work & pertinent test results  History of Anesthesia Complications Negative for: history of anesthetic complications  Airway Mallampati: II  TM Distance: >3 FB Neck ROM: Full    Dental no notable dental hx.    Pulmonary neg sleep apnea, neg COPD, former smoker,    breath sounds clear to auscultation- rhonchi (-) wheezing      Cardiovascular hypertension, Pt. on medications (-) CAD, (-) Past MI, (-) Cardiac Stents and (-) CABG  Rhythm:Regular Rate:Normal - Systolic murmurs and - Diastolic murmurs    Neuro/Psych neg Seizures Paraplegic  negative psych ROS   GI/Hepatic negative GI ROS, Neg liver ROS,   Endo/Other  negative endocrine ROSneg diabetes  Renal/GU negative Renal ROS     Musculoskeletal  (+) Arthritis ,   Abdominal (+) - obese,   Peds  Hematology  (+) anemia ,   Anesthesia Other Findings Past Medical History: No date: Anemia No date: Arthritis No date: Paralysis of both lower limbs (HCC) No date: Sacral decubitus ulcer   Reproductive/Obstetrics                             Anesthesia Physical Anesthesia Plan  ASA: III  Anesthesia Plan: General   Post-op Pain Management:    Induction: Intravenous  PONV Risk Score and Plan: 1 and Ondansetron and Midazolam  Airway Management Planned: Oral ETT  Additional Equipment:   Intra-op Plan:   Post-operative Plan: Extubation in OR  Informed Consent: I have reviewed the patients History and Physical, chart, labs and discussed the procedure including the risks, benefits and alternatives for the proposed anesthesia with the patient or authorized representative who has indicated his/her understanding and acceptance.   Dental advisory given  Plan Discussed with: CRNA and  Anesthesiologist  Anesthesia Plan Comments:         Anesthesia Quick Evaluation

## 2018-06-04 NOTE — Progress Notes (Signed)
06/04/2018  Subjective: Patient currently NPO for sacral wound biopsy and debridement.  No acute events overnight.  Vital signs: Temp:  [96.6 F (35.9 C)-98.4 F (36.9 C)] 96.6 F (35.9 C) (12/30 0929) Pulse Rate:  [65-86] 75 (12/30 0929) Resp:  [18] 18 (12/30 0929) BP: (111-133)/(56-66) 119/56 (12/30 0929) SpO2:  [97 %-100 %] 99 % (12/30 0929)   Intake/Output: 12/29 0701 - 12/30 0700 In: 888.1 [IV Piggyback:888.1] Out: 2800 [Urine:2700; Stool:100] Last BM Date: 06/02/18  Physical Exam: Constitutional: No acute distress Skin:  Sacral decub ulcer, no purulent drainage, getting daikins wet to dry dressing changes.  There is healthy granulation tissue with some fibrinous material in the mid portion of the wound.  Labs:  Recent Labs    06/01/18 1933 06/02/18 0522  WBC 9.6 6.1  HGB 9.6* 9.2*  HCT 34.0* 32.2*  PLT 591* 499*   Recent Labs    06/01/18 1933 06/02/18 0522  NA 137 139  K 3.8 3.6  CL 101 107  CO2 27 26  GLUCOSE 96 113*  BUN 11 10  CREATININE 0.44* 0.43*  CALCIUM 8.5* 8.3*   No results for input(s): LABPROT, INR in the last 72 hours.  Imaging: No results found.  Assessment/Plan: This is a 72 y.o. male with chronic sacral decubitus ulcer and osteomyelitis, s/p multiple prior rounds of antibiotics.  --will take to OR this morning for sacral bone biopsy and debridement of wound.  Discussed risks of bleeding, infection, injury to surrounding structures, and he's willing to proceed. --Wound nurse can follow wound post-op and do dressing changes per ID recommendations.  RN can place wound vac if it's ever needed.  Patient may follow with his wound care center on discharge.   Melvyn Neth, [facility]

## 2018-06-05 LAB — CBC
HEMATOCRIT: 32.4 % — AB (ref 39.0–52.0)
Hemoglobin: 9.1 g/dL — ABNORMAL LOW (ref 13.0–17.0)
MCH: 19.5 pg — ABNORMAL LOW (ref 26.0–34.0)
MCHC: 28.1 g/dL — ABNORMAL LOW (ref 30.0–36.0)
MCV: 69.4 fL — AB (ref 80.0–100.0)
Platelets: 501 10*3/uL — ABNORMAL HIGH (ref 150–400)
RBC: 4.67 MIL/uL (ref 4.22–5.81)
RDW: 18.4 % — ABNORMAL HIGH (ref 11.5–15.5)
WBC: 7.9 10*3/uL (ref 4.0–10.5)
nRBC: 0 % (ref 0.0–0.2)

## 2018-06-05 LAB — SURGICAL PATHOLOGY

## 2018-06-05 LAB — BASIC METABOLIC PANEL
Anion gap: 8 (ref 5–15)
BUN: 15 mg/dL (ref 8–23)
CO2: 25 mmol/L (ref 22–32)
Calcium: 8.4 mg/dL — ABNORMAL LOW (ref 8.9–10.3)
Chloride: 108 mmol/L (ref 98–111)
Creatinine, Ser: 0.53 mg/dL — ABNORMAL LOW (ref 0.61–1.24)
GFR calc Af Amer: >60 mL/min (ref >60)
GFR calc non Af Amer: >60 mL/min (ref >60)
GLUCOSE: 164 mg/dL — AB (ref 70–99)
POTASSIUM: 3.9 mmol/L (ref 3.5–5.1)
Sodium: 141 mmol/L (ref 135–145)

## 2018-06-05 LAB — VANCOMYCIN, TROUGH: Vancomycin Tr: 23 ug/mL (ref 15–20)

## 2018-06-05 LAB — SEDIMENTATION RATE: Sed Rate: 82 mm/hr — ABNORMAL HIGH (ref 0–20)

## 2018-06-05 LAB — C-REACTIVE PROTEIN: CRP: 2.2 mg/dL — ABNORMAL HIGH (ref <1.0)

## 2018-06-05 MED ORDER — VANCOMYCIN HCL 10 G IV SOLR
1250.0000 mg | Freq: Two times a day (BID) | INTRAVENOUS | Status: DC
  Administered 2018-06-06 – 2018-06-08 (×5): 1250 mg via INTRAVENOUS
  Filled 2018-06-05 (×7): qty 1250

## 2018-06-05 MED ORDER — ENOXAPARIN SODIUM 40 MG/0.4ML ~~LOC~~ SOLN
40.0000 mg | SUBCUTANEOUS | Status: DC
  Administered 2018-06-05 – 2018-06-07 (×3): 40 mg via SUBCUTANEOUS
  Filled 2018-06-05 (×3): qty 0.4

## 2018-06-05 NOTE — Progress Notes (Signed)
ID STAGE IV sacral decubitus with underlying osteomyelitis Bone biopsy shows osteo Bone culture pending Currently on vanco, cefepime and flagyl Need to have the microbiological result before I can decide on final antibiotics Will need PICC  Spoke to his PCP who is aware of the plan Spoke to patient who says if he has to get IV antibiotics he cannot do it home and would like to go to [facility]

## 2018-06-05 NOTE — Progress Notes (Signed)
Pharmacy Antibiotic Note  Bradley [name] is a 72 y.o. male admitted on 06/01/2018 with wound infection.  Pharmacy has been consulted for cefepime and vancomycin dosing.  Plan: 12/31 @ 2130 VT 23 mcg/mL drawn 1.5 hrs too early, true trough around 20, regardless patient is accumulating, will decrease dose to vanc 1.25g IV q12h starting 01/01 @ 1000 and will check trough 01/02 @ 2100 prior to 4th dose. UOP 2.25L >> 0.75L will check bmp w/ am labs to assess renal function.  Ke 0.0847 T1/2 ~ 12 hrs Goal trough 15 - 20 mcg/mL  Height: 6\' 3"  (190.5 cm) Weight: 208 lb 15.9 oz (94.8 kg) IBW/kg (Calculated) : 84.5  Temp (24hrs), Avg:98.1 F (36.7 C), Min:97.8 F (36.6 C), Max:98.5 F (36.9 C)  Recent Labs  Lab 06/01/18 1933 06/01/18 1945 06/02/18 0522 06/05/18 0350 06/05/18 2130  WBC 9.6  --  6.1 7.9  --   CREATININE 0.44*  --  0.43* 0.53*  --   LATICACIDVEN  --  1.20  --   --   --   VANCOTROUGH  --   --   --   --  23*    Estimated Creatinine Clearance: 96.8 mL/min (A) (by C-G formula based on SCr of 0.53 mg/dL (L)).    No Known Allergies  Antimicrobials this admission:   Dose adjustments this admission:   Microbiology results:  BCx:   UCx:    Sputum:    MRSA PCR:   Thank you for allowing pharmacy to be a part of this patient's care.  Tobie Lords, PharmD, BCPS Clinical Pharmacist 06/05/2018 10:41 PM

## 2018-06-05 NOTE — Progress Notes (Signed)
[facility] Day(s): 2.   Post op day(s): 1 Day Post-Op.   Interval History: Patient seen and examined, no acute events or new complaints overnight. No reports of significant drainage. No fevers or chills. No additional complaints this morning.   Review of Systems:  Constitutional: denies fever, chills  Integumentary: denies any other rashes or skin discolorations except known sacral decubitus ulcer  Vital signs in last 24 hours: [min-max] current  Temp:  [97 F (36.1 C)-98.4 F (36.9 C)] 97.8 F (36.6 C) (12/31 0813) Pulse Rate:  [60-98] 60 (12/31 0813) Resp:  [9-20] 18 (12/30 2346) BP: (102-127)/(48-78) 125/65 (12/31 0813) SpO2:  [96 %-100 %] 100 % (12/31 0813)     Height: 6\' 3"  (190.5 cm) Weight: 94.8 kg BMI (Calculated): 26.12   Intake/Output this shift:  Total I/O In: -  Out: 450 [Urine:450]   Intake/Output last 2 shifts:  @IOLAST2SHIFTS @   Physical Exam:  Constitutional: alert, cooperative and no distress  Respiratory: breathing non-labored at rest  Integumentary: Stage IV sacral decubitus ulcer, tissue is pink and moist, no purulence or active bleeding. Dressing with minimal saturation.   Labs:  CBC Latest Ref Rng & Units 06/05/2018 06/02/2018 06/01/2018  WBC 4.0 - 10.5 K/uL 7.9 6.1 9.6  Hemoglobin 13.0 - 17.0 g/dL 9.1(L) 9.2(L) 9.6(L)  Hematocrit 39.0 - 52.0 % 32.4(L) 32.2(L) 34.0(L)  Platelets 150 - 400 K/uL 501(H) 499(H) 591(H)   CMP Latest Ref Rng & Units 06/05/2018 06/02/2018 06/01/2018  Glucose 70 - 99 mg/dL 164(H) 113(H) 96  BUN 8 - 23 mg/dL 15 10 11   Creatinine 0.61 - 1.24 mg/dL 0.53(L) 0.43(L) 0.44(L)  Sodium 135 - 145 mmol/L 141 139 137  Potassium 3.5 - 5.1 mmol/L 3.9 3.6 3.8  Chloride 98 - 111 mmol/L 108 107 101  CO2 22 - 32 mmol/L 25 26 27   Calcium 8.9 - 10.3 mg/dL 8.4(L) 8.3(L) 8.5(L)  Total Protein 6.5 - 8.1 g/dL - - 7.7  Total Bilirubin 0.3 - 1.2 mg/dL - - 0.2(L)  Alkaline Phos 38 - 126 U/L -  - 69  AST 15 - 41 U/L - - 14(L)  ALT 0 - 44 U/L - - 8     Assessment/Plan: (ICD-10's: L89.154) 72 y.o. male who is overall doing well 1 Day Post-Op s/p bone biopsy for possible osteomyelitis and stage IV sacral decubitus ulcer, complicated by pertinent comorbidities including a history of anemia, paraplegia, and former tobacco abuse (smoking).   - Continue dressing changes and current wound therapy  - No indication currently for any further surgical intervention  - Follow up with current wound center/wound RN. Wound Center?RN can place wound vac orders if its needed in the future   - General surgery will sign off, please re-consult if new issues arise.    All of the above findings and recommendations were discussed with the patient, and the medical team, and all of patient's questions were answered to his expressed satisfaction.  -- Edison Simon, PA-C [facility] 06/05/2018, 9:34 AM [phone] M-F: 7am - 4pm
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
## 2018-06-06 ENCOUNTER — Inpatient Hospital Stay: Payer: Self-pay

## 2018-06-06 LAB — BASIC METABOLIC PANEL
Anion gap: 7 (ref 5–15)
BUN: 16 mg/dL (ref 8–23)
CO2: 25 mmol/L (ref 22–32)
Calcium: 7.9 mg/dL — ABNORMAL LOW (ref 8.9–10.3)
Chloride: 109 mmol/L (ref 98–111)
Creatinine, Ser: 0.46 mg/dL — ABNORMAL LOW (ref 0.61–1.24)
GFR calc Af Amer: >60 mL/min (ref >60)
Glucose, Bld: 89 mg/dL (ref 70–99)
POTASSIUM: 3.7 mmol/L (ref 3.5–5.1)
Sodium: 141 mmol/L (ref 135–145)

## 2018-06-06 LAB — CULTURE, BLOOD (ROUTINE X 2)
Culture: NO GROWTH
Culture: NO GROWTH

## 2018-06-06 MED ORDER — PROMETHAZINE HCL 25 MG/ML IJ SOLN
12.5000 mg | Freq: Four times a day (QID) | INTRAMUSCULAR | Status: DC | PRN
  Administered 2018-06-06 – 2018-06-08 (×3): 12.5 mg via INTRAVENOUS
  Filled 2018-06-06 (×3): qty 1

## 2018-06-06 MED ORDER — PANTOPRAZOLE SODIUM 40 MG PO TBEC
40.0000 mg | DELAYED_RELEASE_TABLET | Freq: Every day | ORAL | Status: DC
  Administered 2018-06-06 – 2018-06-08 (×3): 40 mg via ORAL
  Filled 2018-06-06 (×3): qty 1

## 2018-06-06 MED ORDER — FLORANEX PO PACK
1.0000 g | PACK | Freq: Three times a day (TID) | ORAL | Status: DC
  Administered 2018-06-06 – 2018-06-08 (×4): 1 g via ORAL
  Filled 2018-06-06 (×8): qty 1

## 2018-06-06 MED ORDER — METOCLOPRAMIDE HCL 10 MG PO TABS
5.0000 mg | ORAL_TABLET | Freq: Three times a day (TID) | ORAL | Status: DC
  Administered 2018-06-06 – 2018-06-08 (×4): 5 mg via ORAL
  Filled 2018-06-06 (×4): qty 1
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
## 2018-06-06 NOTE — Care Management Important Message (Signed)
Copy of signed Medicare IM left with patient in room. 

## 2018-06-07 ENCOUNTER — Inpatient Hospital Stay: Payer: Medicare HMO

## 2018-06-07 LAB — VANCOMYCIN, TROUGH: VANCOMYCIN TR: 19 ug/mL (ref 15–20)
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
## 2018-06-07 NOTE — Clinical Social Work Placement (Signed)
   CLINICAL SOCIAL WORK PLACEMENT  NOTE  Date:  06/07/2018  Patient Details  Name: Bradley [name] MRN: [id number] Date of Birth: 12-10-1945  Clinical Social Work is seeking post-discharge placement for this patient at the [place] level of care (*CSW will initial, date and re-position this form in  chart as items are completed):  Yes   Patient/family provided with [facility] Work Department's list of facilities offering this level of care within the geographic area requested by the patient (or if unable, by the patient's family).  Yes   Patient/family informed of their freedom to choose among providers that offer the needed level of care, that participate in Medicare, Medicaid or managed care program needed by the patient, have an available bed and are willing to accept the patient.  Yes   Patient/family informed of [facility]'s ownership interest in [facility] and [facility], as well as of the fact that they are under no obligation to receive care at these facilities.  PASRR submitted to EDS on       PASRR number received on       Existing PASRR number confirmed on 06/07/18     FL2 transmitted to all facilities in geographic area requested by pt/family on 06/07/18     FL2 transmitted to all facilities within larger geographic area on       Patient informed that his/her managed care company has contracts with or will negotiate with certain facilities, including the following:        Yes   Patient/family informed of bed offers received.  Patient chooses bed at [facility] )     Physician recommends and patient chooses bed at      Patient to be transferred to   on  .  Patient to be transferred to facility by       Patient family notified on   of transfer.  Name of family member notified:        PHYSICIAN       Additional Comment:    _______________________________________________ Airon Sahni, [name], [name] 06/07/2018,  1:24 PM
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
## 2018-06-07 NOTE — Progress Notes (Signed)
Pharmacy Antibiotic Note  Bradley [name] is a 72 y.o. male admitted on 06/01/2018 with wound infection.  Pharmacy has been consulted for cefepime and vancomycin dosing.  Plan: 12/31 @ 2130 VT 23 mcg/mL drawn 1.5 hrs too early, true trough around 20, regardless patient is accumulating, will decrease dose to vanc 1.25g IV q12h starting 01/01 @ 1000 and will check trough 01/02 @ 2100 prior to 4th dose. UOP 2.25L >> 0.75L will check bmp w/ am labs to assess renal function.  Ke 0.0847 T1/2 ~ 12 hrs Goal trough 15 - 20 mcg/mL  1/2 : VT @ 2041 = 19 mcg/mL Will continue pt on current dose of Vancomycin 1250 mg IV Q12H.   Height: 6\' 3"  (190.5 cm) Weight: 208 lb 15.9 oz (94.8 kg) IBW/kg (Calculated) : 84.5  Temp (24hrs), Avg:97.9 F (36.6 C), Min:97.5 F (36.4 C), Max:98.5 F (36.9 C)  Recent Labs  Lab 06/01/18 1933 06/01/18 1945 06/02/18 0522 06/05/18 0350 06/05/18 2130 06/06/18 0331 06/07/18 2041  WBC 9.6  --  6.1 7.9  --   --   --   CREATININE 0.44*  --  0.43* 0.53*  --  0.46*  --   LATICACIDVEN  --  1.20  --   --   --   --   --   VANCOTROUGH  --   --   --   --  23*  --  19    Estimated Creatinine Clearance: 96.8 mL/min (A) (by C-G formula based on SCr of 0.46 mg/dL (L)).    No Known Allergies  Antimicrobials this admission:   Dose adjustments this admission:   Microbiology results:  BCx:   UCx:    Sputum:    MRSA PCR:   Thank you for allowing pharmacy to be a part of this patient's care.  Orene Desanctis, PharmD, BCPS Clinical Pharmacist 06/07/2018 9:29 PM

## 2018-06-07 NOTE — Clinical Social Work Note (Signed)
Clinical Social Work Assessment  Patient Details  Name: Bradley [name] MRN: [id number] Date of Birth: 02/23/1946  Date of referral:  06/07/18               Reason for consult:  Facility Placement                Permission sought to share information with:  Chartered certified accountant granted to share information::  Yes, Verbal Permission Granted  Name::      [place]::   [place]   Relationship::     Contact Information:     Housing/Transportation Living arrangements for the past 2 months:  [place] of Information:  Patient Patient Interpreter Needed:  None Criminal Activity/Legal Involvement Pertinent to Current Situation/Hospitalization:  No - Comment as needed Significant Relationships:  Adult Children Lives with:  Adult Children Do you feel safe going back to the place where you live?  Yes Need for family participation in patient care:  Yes (Comment)  Care giving concerns:  Patient lives in [place] with his 2 adult sons [name] and [name].    Social Worker assessment / plan:  Holiday representative (CSW) reviewed chart and noted that per MD note patient is requesting to go to SNF for IV ABX. CSW met with patient alone at bedside to discuss D/C plan. Patient was alert and oriented X4 and was laying in the bed. CSW introduced self and explained role of CSW department. Per patient he lives in [place] with his 2 sons and has had paralysis of both lower limbs for 50 years. Per patient he uses a wheel chair to get around the house. Patient reported that he can't do the IV ABX at home and wants to go to SNF. CSW explained that [employer] will have to approve SNF. Patient verbalized his understanding and is agreeable to SNF search in [place]. FL2 complete and faxed out. Patient gave CSW his new medicare # [id number].  CSW presented bed offers to patient and discussed quality measures of the facilities. Patient chose  [facility] and reported that he has been there before and his sister in law works there. Per [facility] admissions coordinator at [facility] she will start [facility] authorization today. Per [name] will request PT consult and OT consult. CSW requested PT and OT consults from MD. [name] is aware that patient needs IV ABX and an air mattress. CSW will continue to follow and assist as needed.   Employment status:  Disabled (Comment on whether or not currently receiving Disability) Insurance information:  Managed Medicare PT Recommendations:  Not assessed at this time Information / Referral to community resources:  [place]  Patient/Family's Response to care: Patient chose [facility].   Patient/Family's Understanding of and Emotional Response to Diagnosis, Current Treatment, and Prognosis: Patient was very pleasant and thanked CSW for assistance.   Emotional Assessment Appearance:  Appears stated age Attitude/Demeanor/Rapport:    Affect (typically observed):  Accepting, Adaptable, Pleasant Orientation:  Oriented to Self, Oriented to Place, Oriented to  Time, Oriented to Situation Alcohol / Substance use:  Not Applicable Psych involvement (Current and /or in the community):  No (Comment)  Discharge Needs  Concerns to be addressed:  Discharge Planning Concerns Readmission within the last 30 days:  No Current discharge risk:  Dependent with Mobility, Chronically ill Barriers to Discharge:  Continued Medical Work up   [employer], [name], [name] 06/07/2018, 1:25 PM
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
## 2018-06-07 NOTE — NC FL2 (Signed)
[place] LEVEL OF CARE SCREENING TOOL     IDENTIFICATION  Patient Name: Bradley [name] Birthdate: 04/11/1946 Sex: male Admission Date (Current Location): 06/01/2018  [place] and [place] Number:  Engineering geologist and Address:  [facility], [address] [place], [place] [address]      Provider Number: [id number]  Attending Physician Name and Address:  Gorden Harms, MD  Relative Name and Phone Number:       Current Level of Care: Hospital Recommended Level of Care: [place] Prior Approval Number:    Date Approved/Denied:   PASRR Number: [id number])  Discharge Plan: SNF    Current Diagnoses: Patient Active Problem List   Diagnosis Date Noted  . Sacral decubitus ulcer 06/01/2018  . Osteomyelitis ([place]) 06/01/2018  . Paraplegia ([place]) 06/01/2018  . Iron deficiency anemia 04/19/2017    Orientation RESPIRATION BLADDER Height & Weight     Self, Time, Situation, Place  Normal Incontinent Weight: 208 lb 15.9 oz (94.8 kg) Height:  6\' 3"  (190.5 cm)  BEHAVIORAL SYMPTOMS/MOOD NEUROLOGICAL BOWEL NUTRITION STATUS      Incontinent, Colostomy Diet(Diet: Regular )  AMBULATORY STATUS COMMUNICATION OF NEEDS Skin   Total Care(Paralysis of both lower limbs ) Verbally PU Stage and Appropriate Care, Wound Vac(chronic, slowly healing Sacral Stage 4 pressure injury. 11cm x 11cm x 4.4cm.Marland Kitchen )                       Personal Care Assistance Level of Assistance  Bathing, Feeding, Dressing Bathing Assistance: Maximum assistance Feeding assistance: Limited assistance Dressing Assistance: Maximum assistance     Functional Limitations Info  Sight, Hearing, Speech Sight Info: Impaired Hearing Info: Impaired Speech Info: Adequate    SPECIAL CARE FACTORS FREQUENCY  PT (By licensed PT), OT (By licensed OT)(IV ABX. )     PT Frequency: (5) OT Frequency: (5)            Contractures      Additional Factors Info   Code Status, Allergies Code Status Info: (Full Code. ) Allergies Info: (No Known Allergies. )           Current Medications (06/07/2018):  This is the current hospital active medication list Current Facility-Administered Medications  Medication Dose Route Frequency Provider Last Rate Last Dose  . 0.9 %  sodium chloride infusion   Intravenous PRN Gladstone Lighter, MD 10 mL/hr at 06/04/18 1831    . acetaminophen (TYLENOL) tablet 650 mg  650 mg Oral Q6H PRN Piscoya, Jose, MD       Or  . acetaminophen (TYLENOL) suppository 650 mg  650 mg Rectal Q6H PRN Piscoya, Jose, MD      . baclofen (LIORESAL) tablet 20 mg  20 mg Oral TID Gladstone Lighter, MD   20 mg at 06/07/18 0948  . ceFEPIme (MAXIPIME) 2 g in sodium chloride 0.9 % 100 mL IVPB  2 g Intravenous Q12H Piscoya, Jose, MD 200 mL/hr at 06/07/18 0805 2 g at 06/07/18 0805  . Chlorhexidine Gluconate Cloth 2 % PADS 6 each  6 each Topical Daily Gladstone Lighter, MD   6 each at 06/07/18 [phone]  . enoxaparin (LOVENOX) injection 40 mg  40 mg Subcutaneous Q24H Gladstone Lighter, MD   40 mg at 06/06/18 2219  . finasteride (PROSCAR) tablet 5 mg  5 mg Oral Daily Gladstone Lighter, MD   5 mg at 06/07/18 0801  . lactobacillus (FLORANEX/LACTINEX) granules 1 g  1 g Oral TID  WC Salary, Montell D, MD   1 g at 06/07/18 0804  . metoCLOPramide (REGLAN) tablet 5 mg  5 mg Oral TID AC Salary, Montell D, MD   5 mg at 06/07/18 0804  . metroNIDAZOLE (FLAGYL) IVPB 500 mg  500 mg Intravenous Q8H Piscoya, Jose, MD 100 mL/hr at 06/07/18 0643 500 mg at 06/07/18 0643  . mupirocin ointment (BACTROBAN) 2 % 1 application  1 application Nasal BID Gladstone Lighter, MD   1 application at 16/38/46 [phone]  . ondansetron (ZOFRAN) tablet 4 mg  4 mg Oral Q6H PRN Piscoya, Jose, MD   4 mg at 06/06/18 1507   Or  . ondansetron (ZOFRAN) injection 4 mg  4 mg Intravenous Q6H PRN Olean Ree, MD   4 mg at 06/06/18 2218  . oxyCODONE (Oxy IR/ROXICODONE) immediate release tablet 5 mg  5 mg  Oral Q4H PRN Piscoya, Jose, MD      . pantoprazole (PROTONIX) EC tablet 40 mg  40 mg Oral Daily Salary, Montell D, MD   40 mg at 06/07/18 0801  . polyethylene glycol (MIRALAX / GLYCOLAX) packet 17 g  17 g Oral Daily PRN Gladstone Lighter, MD      . promethazine (PHENERGAN) injection 12.5 mg  12.5 mg Intravenous Q6H PRN Salary, Montell D, MD   12.5 mg at 06/06/18 1656  . vancomycin (VANCOCIN) 1,250 mg in sodium chloride 0.9 % 250 mL IVPB  1,250 mg Intravenous Q12H Gladstone Lighter, MD 166.7 mL/hr at 06/07/18 0953 1,250 mg at 06/07/18 3570     Discharge Medications: Please see discharge summary for a list of discharge medications.  Relevant Imaging Results:  Relevant Lab Results:   Additional Information (SSN: [id number])  Shriley Joffe, [name], [name]

## 2018-06-08 DIAGNOSIS — G8220 Paraplegia, unspecified: Secondary | ICD-10-CM | POA: Diagnosis not present

## 2018-06-08 DIAGNOSIS — N319 Neuromuscular dysfunction of bladder, unspecified: Secondary | ICD-10-CM | POA: Diagnosis not present

## 2018-06-08 DIAGNOSIS — N401 Enlarged prostate with lower urinary tract symptoms: Secondary | ICD-10-CM | POA: Diagnosis not present

## 2018-06-08 DIAGNOSIS — I10 Essential (primary) hypertension: Secondary | ICD-10-CM | POA: Diagnosis not present

## 2018-06-08 DIAGNOSIS — D649 Anemia, unspecified: Secondary | ICD-10-CM | POA: Diagnosis not present

## 2018-06-08 DIAGNOSIS — M1990 Unspecified osteoarthritis, unspecified site: Secondary | ICD-10-CM | POA: Diagnosis not present

## 2018-06-08 DIAGNOSIS — L89154 Pressure ulcer of sacral region, stage 4: Secondary | ICD-10-CM | POA: Diagnosis not present

## 2018-06-08 DIAGNOSIS — Z792 Long term (current) use of antibiotics: Secondary | ICD-10-CM | POA: Diagnosis not present

## 2018-06-08 DIAGNOSIS — M4628 Osteomyelitis of vertebra, sacral and sacrococcygeal region: Secondary | ICD-10-CM | POA: Diagnosis not present

## 2018-06-08 DIAGNOSIS — Z993 Dependence on wheelchair: Secondary | ICD-10-CM | POA: Diagnosis not present

## 2018-06-08 DIAGNOSIS — M6281 Muscle weakness (generalized): Secondary | ICD-10-CM | POA: Diagnosis not present

## 2018-06-08 DIAGNOSIS — I959 Hypotension, unspecified: Secondary | ICD-10-CM | POA: Diagnosis not present

## 2018-06-08 DIAGNOSIS — M2550 Pain in unspecified joint: Secondary | ICD-10-CM | POA: Diagnosis not present

## 2018-06-08 DIAGNOSIS — Z933 Colostomy status: Secondary | ICD-10-CM | POA: Diagnosis not present

## 2018-06-08 DIAGNOSIS — L89323 Pressure ulcer of left buttock, stage 3: Secondary | ICD-10-CM | POA: Diagnosis not present

## 2018-06-08 DIAGNOSIS — D638 Anemia in other chronic diseases classified elsewhere: Secondary | ICD-10-CM | POA: Diagnosis not present

## 2018-06-08 DIAGNOSIS — Z95828 Presence of other vascular implants and grafts: Secondary | ICD-10-CM | POA: Diagnosis not present

## 2018-06-08 DIAGNOSIS — T8189XA Other complications of procedures, not elsewhere classified, initial encounter: Secondary | ICD-10-CM | POA: Diagnosis not present

## 2018-06-08 DIAGNOSIS — Z7401 Bed confinement status: Secondary | ICD-10-CM | POA: Diagnosis not present

## 2018-06-08 DIAGNOSIS — Z87891 Personal history of nicotine dependence: Secondary | ICD-10-CM | POA: Diagnosis not present

## 2018-06-08 DIAGNOSIS — N400 Enlarged prostate without lower urinary tract symptoms: Secondary | ICD-10-CM | POA: Diagnosis not present

## 2018-06-08 DIAGNOSIS — L89159 Pressure ulcer of sacral region, unspecified stage: Secondary | ICD-10-CM | POA: Diagnosis not present

## 2018-06-08 DIAGNOSIS — M869 Osteomyelitis, unspecified: Secondary | ICD-10-CM | POA: Diagnosis not present

## 2018-06-08 DIAGNOSIS — L89313 Pressure ulcer of right buttock, stage 3: Secondary | ICD-10-CM | POA: Diagnosis not present

## 2018-06-08 LAB — BASIC METABOLIC PANEL
Anion gap: 6 (ref 5–15)
BUN: 10 mg/dL (ref 8–23)
CO2: 25 mmol/L (ref 22–32)
Calcium: 8.2 mg/dL — ABNORMAL LOW (ref 8.9–10.3)
Chloride: 110 mmol/L (ref 98–111)
Creatinine, Ser: 0.45 mg/dL — ABNORMAL LOW (ref 0.61–1.24)
GFR calc Af Amer: >60 mL/min (ref >60)
GFR calc non Af Amer: >60 mL/min (ref >60)
GLUCOSE: 87 mg/dL (ref 70–99)
Potassium: 3.7 mmol/L (ref 3.5–5.1)
Sodium: 141 mmol/L (ref 135–145)

## 2018-06-08 MED ORDER — AMPICILLIN-SULBACTAM IV (FOR PTA / DISCHARGE USE ONLY): 3.0000 g | Freq: Four times a day (QID) | INTRAVENOUS | 0 refills | Status: AC

## 2018-06-08 MED ORDER — FLORANEX PO PACK: 1.0000 g | PACK | Freq: Three times a day (TID) | ORAL | 0 refills | Status: DC

## 2018-06-08 MED ORDER — SODIUM CHLORIDE 0.9 % IV SOLN
3.0000 g | Freq: Four times a day (QID) | INTRAVENOUS | Status: DC
  Administered 2018-06-08: 3 g via INTRAVENOUS
  Filled 2018-06-08 (×4): qty 3

## 2018-06-08 MED ORDER — SODIUM CHLORIDE 0.9% FLUSH: 10.0000 mL | INTRAVENOUS | Status: DC | PRN

## 2018-06-08 MED ORDER — VANCOMYCIN IV (FOR PTA / DISCHARGE USE ONLY): 1250.0000 mg | Freq: Two times a day (BID) | INTRAVENOUS | 0 refills | Status: AC

## 2018-06-08 MED ORDER — SODIUM CHLORIDE 0.9% FLUSH: 10.0000 mL | Freq: Two times a day (BID) | INTRAVENOUS | Status: DC

## 2018-06-08 MED ORDER — PANTOPRAZOLE SODIUM 40 MG PO TBEC: 40.0000 mg | DELAYED_RELEASE_TABLET | Freq: Every day | ORAL | 0 refills | Status: DC

## 2018-06-08 MED ORDER — SODIUM CHLORIDE 0.9 % IV SOLN: 3.0000 g | Freq: Four times a day (QID) | INTRAVENOUS | 0 refills | Status: AC

## 2018-06-08 MED ORDER — VANCOMYCIN HCL 10 G IV SOLR: 1250.0000 mg | Freq: Two times a day (BID) | INTRAVENOUS | 0 refills | Status: AC

## 2018-06-08 NOTE — Care Management Important Message (Signed)
Copy of signed Medicare IM left with patient in room. 

## 2018-06-08 NOTE — Consult Note (Signed)
PHARMACY CONSULT NOTE FOR:  OUTPATIENT  PARENTERAL ANTIBIOTIC THERAPY (OPAT)  Indication: Osteomyelitis Regimen:  Vancomycin 1250mg  IV q 12 h Unasyn 3g q 6 h End date:  07/15/2018  IV antibiotic discharge orders are pended. To discharging provider:  please sign these orders via discharge navigator,  Select New Orders & click on the button choice - Manage This Unsigned Work.     Thank you for allowing pharmacy to be a part of this patient's care.  Lu Duffel, PharmD, BCPS Clinical Pharmacist 06/27/2018 3:22 PM

## 2018-06-08 NOTE — Progress Notes (Signed)
Stage IV sacral decub with osteo Bone culture still pending Currently on vanco/cefepime and flagyl. Because of nausea with flagyl DC cefepime and flagyl and change to Unasyn. Final recommendation after culture has resulted

## 2018-06-08 NOTE — Progress Notes (Signed)
Peripherally Inserted Central Catheter/Midline Placement  The IV Nurse has discussed with the patient and/or persons authorized to consent for the patient, the purpose of this procedure and the potential benefits and risks involved with this procedure.  The benefits include less needle sticks, lab draws from the catheter, and the patient may be discharged home with the catheter. Risks include, but not limited to, infection, bleeding, blood clot (thrombus formation), and puncture of an artery; nerve damage and irregular heartbeat and possibility to perform a PICC exchange if needed/ordered by physician.  Alternatives to this procedure were also discussed.  Bard Power PICC patient education guide, fact sheet on infection prevention and patient information card has been provided to patient /or left at bedside.    PICC/Midline Placement Documentation  PICC Single Lumen 07/06/2018 PICC Right Cephalic 40 cm 0 cm (Active)  Indication for Insertion or Continuance of Line Prolonged intravenous therapies 06/13/2018  9:16 AM  Exposed Catheter (cm) 0 cm 07/02/2018  9:16 AM  Site Assessment Clean;Dry;Intact 07/03/2018  9:16 AM  Line Status Flushed;Blood return noted 07/02/2018  9:16 AM  Dressing Type Transparent 06/13/2018  9:16 AM  Dressing Status Clean;Dry;Intact;Antimicrobial disc in place 06/23/2018  9:16 AM  Dressing Intervention New dressing 06/22/2018  9:16 AM  Dressing Change Due 06/15/18 07/04/2018  9:16 AM       Christella Noa Albarece 06/24/2018, 9:17 AM
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
## 2018-06-08 NOTE — Progress Notes (Signed)
Patient is medically stable for D/C to [facility] today. Per [facility] admissions coordinator at [facility] SNF authorization has been received and patient can come today. Per [name] they have an air mattress for patient. RN will call report and arrange EMS for transport. Clinical Education officer, museum (CSW) sent D/C orders to [facility] via [place]. Patient is aware of above. CSW attempted to contact patient's son [name] however he did not answer and his voicemail box was not set up. Please reconsult if future social work needs arise. CSW signing off.   [employer], [name] [phone]
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
## 2018-06-08 NOTE — Clinical Social Work Placement (Signed)
   CLINICAL SOCIAL WORK PLACEMENT  NOTE  Date:  06/14/2018  Patient Details  Name: Bradley [name] MRN: [id number] Date of Birth: Dec 13, 1945  Clinical Social Work is seeking post-discharge placement for this patient at the [place] level of care (*CSW will initial, date and re-position this form in  chart as items are completed):  Yes   Patient/family provided with [place] Work Department's list of facilities offering this level of care within the geographic area requested by the patient (or if unable, by the patient's family).  Yes   Patient/family informed of their freedom to choose among providers that offer the needed level of care, that participate in Medicare, Medicaid or managed care program needed by the patient, have an available bed and are willing to accept the patient.  Yes   Patient/family informed of [facility]'s ownership interest in [facility] and [facility], as well as of the fact that they are under no obligation to receive care at these facilities.  PASRR submitted to EDS on       PASRR number received on       Existing PASRR number confirmed on 06/07/18     FL2 transmitted to all facilities in geographic area requested by pt/family on 06/07/18     FL2 transmitted to all facilities within larger geographic area on       Patient informed that his/her managed care company has contracts with or will negotiate with certain facilities, including the following:        Yes   Patient/family informed of bed offers received.  Patient chooses bed at [facility] )     Physician recommends and patient chooses bed at      Patient to be transferred to [facility] ) on 06/18/2018.  Patient to be transferred to facility by [facility] )     Patient family notified on 06/23/2018 of transfer.  Name of family member notified:  (CSW attempted to contact patient's son [name] however he did not answer and his  voicemail box was not set up. )     PHYSICIAN       Additional Comment:    _______________________________________________ Leianna Barga, [name], [name] 06/16/2018, 5:09 PM

## 2018-06-08 NOTE — Progress Notes (Signed)
PT Cancellation Note  Patient Details Name: Bradley [name] MRN: [id number] DOB: March 26, 1946   Cancelled Treatment:    Reason Eval/Treat Not Completed: Other (comment).  Pt is leaving imminently for SNF and declined tx today.  Will try again if pt is not dc'd today.   Ramond Dial 06/10/2018, 4:31 PM   Mee Hives, PT MS Acute Rehab Dept. Number: [place] and [place]

## 2018-06-08 NOTE — Progress Notes (Signed)
Pharmacy Antibiotic Note  Bradley [name] is a 73 y.o. male admitted on 06/01/2018 with wound infection.  Pharmacy has been consulted for cefepime and vancomycin dosing.  Plan: Vancomycin 1.25 gm IV Q12H with most recent trough 19 mcg/ml - continue current dose.  Cefepime 2 gm IV Q12H - continue current dose.  ID is following and will narrow based on culture results as available.   Height: 6\' 3"  (190.5 cm) Weight: 208 lb 15.9 oz (94.8 kg) IBW/kg (Calculated) : 84.5  Temp (24hrs), Avg:97.8 F (36.6 C), Min:97.5 F (36.4 C), Max:98.2 F (36.8 C)  Recent Labs  Lab 06/01/18 1933 06/01/18 1945 06/02/18 0522 06/05/18 0350 06/05/18 2130 06/06/18 0331 06/07/18 2041 07/06/2018 0532  WBC 9.6  --  6.1 7.9  --   --   --   --   CREATININE 0.44*  --  0.43* 0.53*  --  0.46*  --  0.45*  LATICACIDVEN  --  1.20  --   --   --   --   --   --   VANCOTROUGH  --   --   --   --  23*  --  19  --     Estimated Creatinine Clearance: 96.8 mL/min (A) (by C-G formula based on SCr of 0.45 mg/dL (L)).    No Known Allergies  Antimicrobials this admission:   Dose adjustments this admission:   Microbiology results:  BCx:   UCx:    Sputum:    MRSA PCR:   Thank you for allowing pharmacy to be a part of this patient's care.  Laural Benes, PharmD, BCPS Clinical Pharmacist 06/26/2018 7:53 AM

## 2018-06-08 NOTE — Treatment Plan (Signed)
Diagnosis: Sacral decubitus stage IV with osteomyelitis Baseline Creatinine 0.45 VAnco trough 19 on 06/07/18 at the dose of 1211m Q 12 hrs      No Known Allergies  OPAT Orders Discharge antibiotics: Vancomycin 12575mIV q12 Per pharmacy protocol -dose need to be adjusted for creatinine and vanco trough Aim for Vancomycin trough 15-20  Duration:6 weeks End Date: 07/15/18  Unasyn 3grams IV every 6 hours until 07/15/18 ------------------------------------------------------------- [facility] --------------------------------------------------------- Labs on Monday  while on IV antibiotics: _X_ CBC with differential  _X_ CMP  _X_ ESR  X vanco trough  Labs On Thursday while on IV antibiotics:  Vanco trough  Basic metabolic panel ------------------------------------------------------------------ _X_ Please pull PIC at completion of IV antibiotics  Fax weekly labs to ([phone] Follow Up Appt with Dr.Jacquelin Krajewski in 4 weeks  Call [phone] make appt

## 2018-06-08 NOTE — Progress Notes (Signed)
Pt d/c'd in the company of EMS.

## 2018-06-11 LAB — AEROBIC/ANAEROBIC CULTURE W GRAM STAIN (SURGICAL/DEEP WOUND)

## 2018-06-11 LAB — AEROBIC/ANAEROBIC CULTURE (SURGICAL/DEEP WOUND)

## 2018-06-29 DIAGNOSIS — L89154 Pressure ulcer of sacral region, stage 4: Secondary | ICD-10-CM | POA: Diagnosis not present

## 2018-06-29 DIAGNOSIS — G8220 Paraplegia, unspecified: Secondary | ICD-10-CM | POA: Diagnosis not present

## 2018-06-29 DIAGNOSIS — M869 Osteomyelitis, unspecified: Secondary | ICD-10-CM | POA: Diagnosis not present

## 2018-07-05 ENCOUNTER — Inpatient Hospital Stay: Payer: Medicare HMO | Admitting: Infectious Diseases
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
## 2018-07-07 DEATH — deceased

## 2018-07-09 DIAGNOSIS — L89159 Pressure ulcer of sacral region, unspecified stage: Secondary | ICD-10-CM | POA: Diagnosis not present

## 2018-07-09 DIAGNOSIS — M869 Osteomyelitis, unspecified: Secondary | ICD-10-CM | POA: Diagnosis not present

## 2018-07-09 DIAGNOSIS — D649 Anemia, unspecified: Secondary | ICD-10-CM | POA: Diagnosis not present

## 2018-07-09 DIAGNOSIS — M1990 Unspecified osteoarthritis, unspecified site: Secondary | ICD-10-CM | POA: Diagnosis not present

## 2018-07-09 DIAGNOSIS — G8220 Paraplegia, unspecified: Secondary | ICD-10-CM | POA: Diagnosis not present

## 2018-07-10 ENCOUNTER — Encounter: Payer: Self-pay | Admitting: Infectious Diseases

## 2018-07-10 ENCOUNTER — Ambulatory Visit: Payer: Medicare HMO | Attending: Infectious Diseases | Admitting: Infectious Diseases

## 2018-07-10 VITALS — BP 123/77 | HR 71 | Temp 97.8°F | Wt 213.0 lb

## 2018-07-10 DIAGNOSIS — N319 Neuromuscular dysfunction of bladder, unspecified: Secondary | ICD-10-CM

## 2018-07-10 DIAGNOSIS — Z87891 Personal history of nicotine dependence: Secondary | ICD-10-CM

## 2018-07-10 DIAGNOSIS — Z792 Long term (current) use of antibiotics: Secondary | ICD-10-CM | POA: Diagnosis not present

## 2018-07-10 DIAGNOSIS — L89154 Pressure ulcer of sacral region, stage 4: Secondary | ICD-10-CM

## 2018-07-10 DIAGNOSIS — Z95828 Presence of other vascular implants and grafts: Secondary | ICD-10-CM | POA: Diagnosis not present

## 2018-07-10 DIAGNOSIS — G8220 Paraplegia, unspecified: Secondary | ICD-10-CM

## 2018-07-10 DIAGNOSIS — Z993 Dependence on wheelchair: Secondary | ICD-10-CM | POA: Diagnosis not present

## 2018-07-10 DIAGNOSIS — M4628 Osteomyelitis of vertebra, sacral and sacrococcygeal region: Secondary | ICD-10-CM | POA: Diagnosis not present

## 2018-07-10 DIAGNOSIS — Z933 Colostomy status: Secondary | ICD-10-CM

## 2018-07-10 NOTE — Progress Notes (Signed)
NAME: Bradley [name]  DOB: 10-Jun-1945  MRN: [id number]  Date/Time: 07/10/2018 12:26 PM Subjective:  Follow-up after recent hospitalization ? Bradley [name] is a 73 y.o. with a history of paraparesis following injury to the spine, chronic stage IV sacral decubitus ulcer was admitted to the hospital between12/27/2019 until 06/09/2018 for osteomyelitis of the sacrum which was diagnosed by an MRI done by the [place] wound clinic.  Patient underwent sacral bone biopsy which showed osteomyelitis in the bone culture was positive for Corynebacterium stratum.  Patient has a diverting colostomy placed many years ago. Patient was sent to the facility on IV vancomycin and IV Unasyn.  In the facility as Unasyn was on short supply they changed to Zosyn.  As patient could not tolerate Flagyl when he was in the hospital.  Patient has been doing fine.  His main complaint is asked to stay in bed in the facility.  He wants to sit in the wheelchair at least 2 hours a day. He  was seen in the [facility] wound care clinic. He will complete 6 weeks of antibiotics on July 15, 2018. Past Medical History:  Diagnosis Date  . Anemia   . Arthritis   . Paralysis of both lower limbs ([place])   . Sacral decubitus ulcer     Past Surgical History:  Procedure Laterality Date  . COLONOSCOPY  06/06/2014  . INCISION AND DRAINAGE OF WOUND N/A 06/04/2018   Procedure: IRRIGATION AND DEBRIDEMENT WOUND;  Surgeon: Olean Ree, MD;  Location: [facility] ORS;  Service: General;  Laterality: N/A;  . TONSILLECTOMY      Social History   Socioeconomic History  . Marital status: Married    Spouse name: Not on file  . Number of children: Not on file  . Years of education: Not on file  . Highest education level: Not on file  Occupational History  . Not on file  Social Needs  . Financial resource strain: Not on file  . Food insecurity:    Worry: Not on file    Inability: Not on file  . Transportation needs:    Medical: Not on file    Non-medical:  Not on file  Tobacco Use  . Smoking status: Former Smoker    Packs/day: 0.50    Years: 5.00    Pack years: 2.50    Last attempt to quit: 1972    Years since quitting: 48.1  . Smokeless tobacco: Never Used  Substance and Sexual Activity  . Alcohol use: No  . Drug use: No  . Sexual activity: Not on file  Lifestyle  . Physical activity:    Days per week: Not on file    Minutes per session: Not on file  . Stress: Not on file  Relationships  . Social connections:    Talks on phone: Not on file    Gets together: Not on file    Attends religious service: Not on file    Active member of club or organization: Not on file    Attends meetings of clubs or organizations: Not on file    Relationship status: Not on file  . Intimate partner violence:    Fear of current or ex partner: Not on file    Emotionally abused: Not on file    Physically abused: Not on file    Forced sexual activity: Not on file  Other Topics Concern  . Not on file  Social History Narrative  . Not on file    Family History  Problem Relation Age of Onset  . Breast cancer Mother    No Known Allergies I ? Current Outpatient Medications  Medication Sig Dispense Refill  . acetaminophen (TYLENOL) 500 MG tablet Take 1,000 mg by mouth 3 (three) times daily.     Marland Kitchen amLODipine (NORVASC) 5 MG tablet Take 5 mg by mouth daily.    Marland Kitchen ampicillin-sulbactam (UNASYN) IVPB Inject 3 g into the vein every 6 (six) hours. Indication:  Osteomyelitis (Sacral Decubitus Stage IV) Last Day of Therapy:  07/15/2018 Labs - Once weekly:  CBC/D and BMP, Labs - Every other week:  ESR and CRP 144 Units 0  . Ampicillin-Sulbactam 3 g in sodium chloride 0.9 % 100 mL Inject 3 g into the vein every 6 (six) hours. 110 Dose 0  . baclofen (LIORESAL) 10 MG tablet Take 20 mg by mouth 3 (three) times daily.    . finasteride (PROSCAR) 5 MG tablet Take 5 mg by mouth daily.    Marland Kitchen lactobacillus (FLORANEX/LACTINEX) PACK Take 1 packet (1 g total) by mouth 3  (three) times daily with meals. 120 packet 0  . Multiple Vitamin (MULTI-VITAMINS) TABS Take 1 tablet by mouth daily.     . pantoprazole (PROTONIX) 40 MG tablet Take 1 tablet (40 mg total) by mouth daily. 60 tablet 0  . polyethylene glycol powder (GLYCOLAX/MIRALAX) powder Take 17 g by mouth daily as needed for mild constipation.     . vancomycin 1,250 mg in sodium chloride 0.9 % 250 mL Inject 1,250 mg into the vein every 12 (twelve) hours. 76 Dose 0  . vancomycin IVPB Inject 1,250 mg into the vein every 12 (twelve) hours. Indication:  Osteomyelitis Last Day of Therapy:  07/15/2018 Labs - Sunday/Monday:  CBC/D, BMP, and vancomycin trough. Labs - Thursday:  BMP and vancomycin trough Labs - Every other week:  ESR and CRP 72 Units 0  . vitamin C (ASCORBIC ACID) 500 MG tablet Take 500 mg by mouth 2 (two) times daily.    . CVS ZINC 50 MG TABS Take 50 mg by mouth 2 (two) times daily.    . ferrous sulfate 325 (65 FE) MG tablet Take 325 mg by mouth 2 (two) times daily.     No current facility-administered medications for this visit.      Abtx:  Anti-infectives (From admission, onward)   None      REVIEW OF SYSTEMS:  Const: negative fever, negative chills, negative weight loss Eyes: negative diplopia or visual changes, negative eye pain ENT: negative coryza, negative sore throat Resp: negative cough, hemoptysis, dyspnea Cards: negative for chest pain, palpitations, lower extremity edema GU: Neurogenic bladder -self catheterizes GI: Negative for abdominal pain, diarrhea, bleeding, constipation Skin: negative for rash and pruritus Heme: negative for easy bruising and gum/nose bleeding MS: negative for myalgias, arthralgias, back pain and muscle weakness Neurolo: Weakness arms and legs Psych: negative for feelings of anxiety, depression  Endocrine: No  polyuria or polydipsia Allergy/Immunology- negative for any medication or food allergies ? Objective:  VITALS:  BP 123/77 (BP Location:  Left Arm, Patient Position: Sitting, Cuff Size: Large)   Pulse 71   Temp 97.8 F (36.6 C) (Oral)   Wt 213 lb (96.6 kg)   BMI 26.62 kg/m  PHYSICAL EXAM: Limited examination as patient is in wheelchair General: Alert, cooperative, no distress, appears stated age.  Head: Normocephalic, without obvious abnormality, atraumatic. Eyes: Conjunctivae clear, anicteric sclerae. Pupils are equal ENT Nares normal. No drainage or sinus tenderness. Lips, mucosa, and tongue normal.  No Thrush Neck: Supple, symmetrical, no adenopathy, thyroid: non tender no carotid bruit and no JVD. Back: Did not examine. Lungs: Clear to auscultation bilaterally. No Wheezing or Rhonchi. No rales. Heart: S1-S2 Abdomen: Soft, colostomy in place Sacral wound could not be examined Extremities: atraumatic, no cyanosis. No edema. No clubbing Skin: No rashes or lesions. Or bruising Lymph: Cervical, supraclavicular normal. Neurologic: Weakness legs wheelchair-bound some weakness in arms as well Pertinent Labs  Labs from the facility reviewed He has got normal creatinine which is 0.5 Vanco trough around 16 Hemoglobin 12 WBC 7 eosinophil count 10%   Impression/Recommendation ? ?73 year old male with history of paraparesis with stage IV sacral decub ?Stage IV sacral decub with underlying osteomyelitis.  Is on vancomycin and and Zosyn.  The latter is being given in the facility because Unasyn is in short supply.  He is going to finish both his antibiotics on February 9 which would be 6 weeks.  His PICC line will be removed.  He will have to follow-up with the wound care clinic at [facility] for them to consider plastic surgery option. I do not need to see the patient on a  routine basis. He will follow-up with his PCP and [facility] wound clinic ___________________________________________________ Discussed with patient, and his sister who accompanied him to the visit Note:  This document was prepared using Dragon voice recognition  software and may include unintentional dictation errors.

## 2018-07-10 NOTE — Patient Instructions (Signed)
You are here for follo up of the sacral wound and Iv antibiotics- you ill complete IV vanco /zosyn( your facility is giving this antibiotic instead of unasyn) on 07/15/18 and PICC will be removed- please follow with [place] wound clinic

## 2018-07-18 ENCOUNTER — Other Ambulatory Visit: Payer: Self-pay

## 2018-07-18 NOTE — Patient Outreach (Signed)
[facility]) Care Management  07/18/2018  HINES KLOSS [birth year] [id number]   EMMI- General Discharge RED ON EMMI ALERT Day # 1 Date: 07/17/2018  Red Alert Reason:  Read discharge papers? No  Scheduled follow-up? No    Outreach attempt: spoke with patient.  He is skeptical of call.  Patient did verify HIPAA.  Discussed with patient reason for the phone call and addressed red alerts.  Patient states he has read his discharge instructions and has no questions.  Patient states that he does have follow ups scheduled.  He states he sees the wound center on 07-27-2018 and he has an appointment with PCP in April.  Patient states that he also has home health who is due to come by today for evaluation.  Patient states that he still has his PICC line in and wondered about that being taken out.  Advised patient to discuss with home health and wound care center to make sure he does not need any further IV antibiotics.  He verbalized understanding and denies any further concerns.     Plan: RN CM will close case.    Jone Baseman, RN, MSN [facility] Care Management Care Management Coordinator Direct Line [phone] Toll Free: [phone]  Fax: [phone]

## 2018-07-20 DIAGNOSIS — J309 Allergic rhinitis, unspecified: Secondary | ICD-10-CM | POA: Diagnosis not present

## 2018-07-20 DIAGNOSIS — G8220 Paraplegia, unspecified: Secondary | ICD-10-CM | POA: Diagnosis not present

## 2018-07-20 DIAGNOSIS — N401 Enlarged prostate with lower urinary tract symptoms: Secondary | ICD-10-CM | POA: Diagnosis not present

## 2018-07-20 DIAGNOSIS — L89154 Pressure ulcer of sacral region, stage 4: Secondary | ICD-10-CM | POA: Diagnosis not present

## 2018-07-20 DIAGNOSIS — I10 Essential (primary) hypertension: Secondary | ICD-10-CM | POA: Diagnosis not present

## 2018-07-20 DIAGNOSIS — N319 Neuromuscular dysfunction of bladder, unspecified: Secondary | ICD-10-CM | POA: Diagnosis not present

## 2018-07-20 DIAGNOSIS — N39498 Other specified urinary incontinence: Secondary | ICD-10-CM | POA: Diagnosis not present

## 2018-07-20 DIAGNOSIS — S24104S Unspecified injury at T11-T12 level of thoracic spinal cord, sequela: Secondary | ICD-10-CM | POA: Diagnosis not present

## 2018-07-20 DIAGNOSIS — N400 Enlarged prostate without lower urinary tract symptoms: Secondary | ICD-10-CM | POA: Diagnosis not present

## 2018-07-20 DIAGNOSIS — D509 Iron deficiency anemia, unspecified: Secondary | ICD-10-CM | POA: Diagnosis not present

## 2018-07-20 DIAGNOSIS — Z466 Encounter for fitting and adjustment of urinary device: Secondary | ICD-10-CM | POA: Diagnosis not present

## 2018-07-22 ENCOUNTER — Emergency Department: Payer: Medicare HMO

## 2018-07-22 ENCOUNTER — Emergency Department
Admission: EM | Admit: 2018-07-22 | Discharge: 2018-07-22 | Disposition: A | Discharge status: Home / Self Care | Payer: Medicare HMO | Attending: Emergency Medicine | Admitting: Emergency Medicine

## 2018-07-22 ENCOUNTER — Other Ambulatory Visit: Payer: Self-pay

## 2018-07-22 DIAGNOSIS — Z452 Encounter for adjustment and management of vascular access device: Secondary | ICD-10-CM | POA: Insufficient documentation

## 2018-07-22 DIAGNOSIS — Z79899 Other long term (current) drug therapy: Secondary | ICD-10-CM | POA: Diagnosis not present

## 2018-07-22 DIAGNOSIS — M869 Osteomyelitis, unspecified: Secondary | ICD-10-CM | POA: Diagnosis not present

## 2018-07-22 DIAGNOSIS — T82898A Other specified complication of vascular prosthetic devices, implants and grafts, initial encounter: Secondary | ICD-10-CM

## 2018-07-22 DIAGNOSIS — Z87891 Personal history of nicotine dependence: Secondary | ICD-10-CM | POA: Insufficient documentation

## 2018-07-22 NOTE — Discharge Instructions (Addendum)
Please return tomorrow for recheck to make sure that the site is healing well.  If you develop any problems with chest pain or shortness of breath please return then.

## 2018-07-22 NOTE — ED Triage Notes (Signed)
States here to have PICC line removed. Was receiving antibiotics for bone infection for 6 weeks. States it was placed here. A&O, in wheelchair. No distress noted.

## 2018-07-22 NOTE — ED Notes (Signed)
This RN checked the site where the PICC line was removed by MD Malinda. No bleeding was noted at the site. Transparent dressing was applied by this RN with gauze. Pt educated to watch site in case of bleeding and advised to immediately come back to the ED if bleeding starts.

## 2018-07-22 NOTE — ED Provider Notes (Signed)
[facility] Emergency Department Provider Note   ____________________________________________   First MD Initiated Contact with Patient 07/22/18 1437     (approximate)  I have reviewed the triage vital signs and the nursing notes.   HISTORY  Chief Complaint Vascular Access Problem   HPI Bradley [name] is a 73 y.o. male patient has finished using his PICC line.  He came in to have it removed.  Discussed with vascular who confirmed my suspicions that we just pulled the PICC line.  This was done patient tolerated very well.  We will check a chest x-ray in about a half an hour to make sure everything is still okay and I anticipate he can go home.   Past Medical History:  Diagnosis Date  . Anemia   . Arthritis   . Paralysis of both lower limbs ([place])   . Sacral decubitus ulcer     Patient Active Problem List   Diagnosis Date Noted  . Sacral decubitus ulcer 06/01/2018  . Osteomyelitis ([place]) 06/01/2018  . Paraplegia ([place]) 06/01/2018  . Iron deficiency anemia 04/19/2017    Past Surgical History:  Procedure Laterality Date  . COLONOSCOPY  06/06/2014  . INCISION AND DRAINAGE OF WOUND N/A 06/04/2018   Procedure: IRRIGATION AND DEBRIDEMENT WOUND;  Surgeon: Olean Ree, MD;  Location: [facility] ORS;  Service: General;  Laterality: N/A;  . TONSILLECTOMY      Prior to Admission medications   Medication Sig Start Date End Date Taking? Authorizing Provider  acetaminophen (TYLENOL) 500 MG tablet Take 1,000 mg by mouth 3 (three) times daily.     [provider]  amLODipine (NORVASC) 5 MG tablet Take 5 mg by mouth daily. 08/29/16   [provider]  baclofen (LIORESAL) 10 MG tablet Take 20 mg by mouth 3 (three) times daily. 11/30/16   [provider]  [employer] ZINC 50 MG TABS Take 50 mg by mouth 2 (two) times daily.    [provider]  ferrous sulfate 325 (65 FE) MG tablet Take 325 mg by mouth 2 (two) times daily.    [provider]  finasteride (PROSCAR) 5 MG tablet Take 5 mg by mouth daily. 08/29/16   [provider]  lactobacillus (FLORANEX/LACTINEX) PACK Take 1 packet (1 g total) by mouth 3 (three) times daily with meals. 06/29/2018   Salary, Avel Peace, MD  Multiple Vitamin (MULTI-VITAMINS) TABS Take 1 tablet by mouth daily.     [provider]  pantoprazole (PROTONIX) 40 MG tablet Take 1 tablet (40 mg total) by mouth daily. 06/09/18   Salary, Holly Bodily D, MD  polyethylene glycol powder (GLYCOLAX/MIRALAX) powder Take 17 g by mouth daily as needed for mild constipation.     [provider]  vitamin C (ASCORBIC ACID) 500 MG tablet Take 500 mg by mouth 2 (two) times daily.    [provider]    Allergies Patient has no known allergies.  Family History  Problem Relation Age of Onset  . Breast cancer Mother     Social History Social History   Tobacco Use  . Smoking status: Former Smoker    Packs/day: 0.50    Years: 5.00    Pack years: 2.50    Last attempt to quit: 1972    Years since quitting: 48.1  . Smokeless tobacco: Never Used  Substance Use Topics  . Alcohol use: No  . Drug use: No    Review of Systems  Constitutional: No fever/chills Eyes: No visual changes.  ENT: No sore throat. Cardiovascular: Denies chest pain. Respiratory: Denies shortness of breath. Gastrointestinal: No abdominal pain.  No nausea, no vomiting.  No diarrhea.  No constipation. Genitourinary: Negative for dysuria. Musculoskeletal: Negative for back pain. Skin: Negative for rash. Neurological: Negative for headaches, focal weakness  ____________________________________________   PHYSICAL EXAM:  VITAL SIGNS: ED Triage Vitals [07/22/18 1227]  Enc Vitals Group     BP 139/63     Pulse Rate 84     Resp 16     Temp 97.8 F (36.6 C)     Temp Source Oral     SpO2 100 %     Weight 209 lb (94.8 kg)     Height 6\' 3"  (1.905 m)     Head Circumference      Peak Flow      Pain Score  0     Pain Loc      Pain Edu?      Excl. in [place]?     Constitutional: Alert and oriented. Well appearing and in no acute distress. Eyes: Conjunctivae are normal. Head: Atraumatic. Nose: No congestion/rhinnorhea. Mouth/Throat: Mucous membranes are moist.  Oropharynx non-erythematous. Neck: No stridor.  Cardiovascular:   Good peripheral circulation. Respiratory: Normal respiratory effort.  No retractions Musculoskeletal: No lower extremity tenderness nor edema.  Patient does have slight puffiness in his wrist which he reports is normal. Neurologic:  Normal speech and language. No gross focal neurologic deficits are appreciated.  Skin:  Skin is warm, dry and intact. No rash noted. Psychiatric: Mood and affect are normal. Speech and behavior are normal.  ____________________________________________   LABS (all labs ordered are listed, but only abnormal results are displayed)  Labs Reviewed - No data to display ____________________________________________  EKG  _____________________________________  RADIOLOGY  ED MD interpretation: Chest x-ray read by radiology reviewed by me is negative  Official radiology report(s): No results found.  ____________________________________________   PROCEDURES  Procedure(s) performed:   Procedures  Critical Care performed:   ____________________________________________   INITIAL IMPRESSION / ASSESSMENT AND PLAN / ED COURSE  Patient did well.  Chest x-ray showed no pneumothorax or any other problems.          ____________________________________________   FINAL CLINICAL IMPRESSION(S) / ED DIAGNOSES  Final diagnoses:  Occlusion of peripherally inserted central catheter (PICC) line, initial encounter [facility])  Actual diagnosis is PICC line removal.  I cannot find this in the computer.   ED Discharge Orders    None       Note:  This document was prepared using Dragon voice recognition software and may include  unintentional dictation errors.    Nena Polio, MD 07/23/18 1754

## 2018-07-23 DIAGNOSIS — J309 Allergic rhinitis, unspecified: Secondary | ICD-10-CM | POA: Diagnosis not present

## 2018-07-23 DIAGNOSIS — D509 Iron deficiency anemia, unspecified: Secondary | ICD-10-CM | POA: Diagnosis not present

## 2018-07-23 DIAGNOSIS — L89154 Pressure ulcer of sacral region, stage 4: Secondary | ICD-10-CM | POA: Diagnosis not present

## 2018-07-23 DIAGNOSIS — N319 Neuromuscular dysfunction of bladder, unspecified: Secondary | ICD-10-CM | POA: Diagnosis not present

## 2018-07-23 DIAGNOSIS — N400 Enlarged prostate without lower urinary tract symptoms: Secondary | ICD-10-CM | POA: Diagnosis not present

## 2018-07-23 DIAGNOSIS — I10 Essential (primary) hypertension: Secondary | ICD-10-CM | POA: Diagnosis not present

## 2018-07-23 DIAGNOSIS — Z466 Encounter for fitting and adjustment of urinary device: Secondary | ICD-10-CM | POA: Diagnosis not present

## 2018-07-23 DIAGNOSIS — S24104S Unspecified injury at T11-T12 level of thoracic spinal cord, sequela: Secondary | ICD-10-CM | POA: Diagnosis not present

## 2018-07-23 DIAGNOSIS — G8220 Paraplegia, unspecified: Secondary | ICD-10-CM | POA: Diagnosis not present

## 2018-07-24 DIAGNOSIS — S24104S Unspecified injury at T11-T12 level of thoracic spinal cord, sequela: Secondary | ICD-10-CM | POA: Diagnosis not present

## 2018-07-24 DIAGNOSIS — N400 Enlarged prostate without lower urinary tract symptoms: Secondary | ICD-10-CM | POA: Diagnosis not present

## 2018-07-24 DIAGNOSIS — D509 Iron deficiency anemia, unspecified: Secondary | ICD-10-CM | POA: Diagnosis not present

## 2018-07-24 DIAGNOSIS — Z466 Encounter for fitting and adjustment of urinary device: Secondary | ICD-10-CM | POA: Diagnosis not present

## 2018-07-24 DIAGNOSIS — J309 Allergic rhinitis, unspecified: Secondary | ICD-10-CM | POA: Diagnosis not present

## 2018-07-24 DIAGNOSIS — L89154 Pressure ulcer of sacral region, stage 4: Secondary | ICD-10-CM | POA: Diagnosis not present

## 2018-07-24 DIAGNOSIS — N319 Neuromuscular dysfunction of bladder, unspecified: Secondary | ICD-10-CM | POA: Diagnosis not present

## 2018-07-24 DIAGNOSIS — I10 Essential (primary) hypertension: Secondary | ICD-10-CM | POA: Diagnosis not present

## 2018-07-24 DIAGNOSIS — G8220 Paraplegia, unspecified: Secondary | ICD-10-CM | POA: Diagnosis not present

## 2018-07-30 DIAGNOSIS — N319 Neuromuscular dysfunction of bladder, unspecified: Secondary | ICD-10-CM | POA: Diagnosis not present

## 2018-07-31 DIAGNOSIS — J309 Allergic rhinitis, unspecified: Secondary | ICD-10-CM | POA: Diagnosis not present

## 2018-07-31 DIAGNOSIS — L89154 Pressure ulcer of sacral region, stage 4: Secondary | ICD-10-CM | POA: Diagnosis not present

## 2018-07-31 DIAGNOSIS — Z466 Encounter for fitting and adjustment of urinary device: Secondary | ICD-10-CM | POA: Diagnosis not present

## 2018-07-31 DIAGNOSIS — N319 Neuromuscular dysfunction of bladder, unspecified: Secondary | ICD-10-CM | POA: Diagnosis not present

## 2018-07-31 DIAGNOSIS — S24104S Unspecified injury at T11-T12 level of thoracic spinal cord, sequela: Secondary | ICD-10-CM | POA: Diagnosis not present

## 2018-07-31 DIAGNOSIS — G8220 Paraplegia, unspecified: Secondary | ICD-10-CM | POA: Diagnosis not present

## 2018-07-31 DIAGNOSIS — D509 Iron deficiency anemia, unspecified: Secondary | ICD-10-CM | POA: Diagnosis not present

## 2018-07-31 DIAGNOSIS — N400 Enlarged prostate without lower urinary tract symptoms: Secondary | ICD-10-CM | POA: Diagnosis not present

## 2018-07-31 DIAGNOSIS — I10 Essential (primary) hypertension: Secondary | ICD-10-CM | POA: Diagnosis not present

## 2018-08-01 DIAGNOSIS — N400 Enlarged prostate without lower urinary tract symptoms: Secondary | ICD-10-CM | POA: Diagnosis not present

## 2018-08-01 DIAGNOSIS — Z466 Encounter for fitting and adjustment of urinary device: Secondary | ICD-10-CM | POA: Diagnosis not present

## 2018-08-01 DIAGNOSIS — S24104S Unspecified injury at T11-T12 level of thoracic spinal cord, sequela: Secondary | ICD-10-CM | POA: Diagnosis not present

## 2018-08-01 DIAGNOSIS — N319 Neuromuscular dysfunction of bladder, unspecified: Secondary | ICD-10-CM | POA: Diagnosis not present

## 2018-08-01 DIAGNOSIS — I10 Essential (primary) hypertension: Secondary | ICD-10-CM | POA: Diagnosis not present

## 2018-08-01 DIAGNOSIS — J309 Allergic rhinitis, unspecified: Secondary | ICD-10-CM | POA: Diagnosis not present

## 2018-08-01 DIAGNOSIS — G8220 Paraplegia, unspecified: Secondary | ICD-10-CM | POA: Diagnosis not present

## 2018-08-01 DIAGNOSIS — L89154 Pressure ulcer of sacral region, stage 4: Secondary | ICD-10-CM | POA: Diagnosis not present

## 2018-08-01 DIAGNOSIS — D509 Iron deficiency anemia, unspecified: Secondary | ICD-10-CM | POA: Diagnosis not present

## 2018-08-02 DIAGNOSIS — Z6825 Body mass index (BMI) 25.0-25.9, adult: Secondary | ICD-10-CM | POA: Diagnosis not present

## 2018-08-02 DIAGNOSIS — I10 Essential (primary) hypertension: Secondary | ICD-10-CM | POA: Diagnosis not present

## 2018-08-02 DIAGNOSIS — S24104S Unspecified injury at T11-T12 level of thoracic spinal cord, sequela: Secondary | ICD-10-CM | POA: Diagnosis not present

## 2018-08-02 DIAGNOSIS — N319 Neuromuscular dysfunction of bladder, unspecified: Secondary | ICD-10-CM | POA: Diagnosis not present

## 2018-08-02 DIAGNOSIS — J309 Allergic rhinitis, unspecified: Secondary | ICD-10-CM | POA: Diagnosis not present

## 2018-08-02 DIAGNOSIS — L89154 Pressure ulcer of sacral region, stage 4: Secondary | ICD-10-CM | POA: Diagnosis not present

## 2018-08-02 DIAGNOSIS — D509 Iron deficiency anemia, unspecified: Secondary | ICD-10-CM | POA: Diagnosis not present

## 2018-08-02 DIAGNOSIS — E440 Moderate protein-calorie malnutrition: Secondary | ICD-10-CM | POA: Diagnosis not present

## 2018-08-02 DIAGNOSIS — G8220 Paraplegia, unspecified: Secondary | ICD-10-CM | POA: Diagnosis not present

## 2018-08-02 DIAGNOSIS — N400 Enlarged prostate without lower urinary tract symptoms: Secondary | ICD-10-CM | POA: Diagnosis not present

## 2018-08-02 DIAGNOSIS — R159 Full incontinence of feces: Secondary | ICD-10-CM | POA: Diagnosis not present

## 2018-08-02 DIAGNOSIS — M868X8 Other osteomyelitis, other site: Secondary | ICD-10-CM | POA: Diagnosis not present

## 2018-08-03 DIAGNOSIS — D509 Iron deficiency anemia, unspecified: Secondary | ICD-10-CM | POA: Diagnosis not present

## 2018-08-03 DIAGNOSIS — I10 Essential (primary) hypertension: Secondary | ICD-10-CM | POA: Diagnosis not present

## 2018-08-03 DIAGNOSIS — G8220 Paraplegia, unspecified: Secondary | ICD-10-CM | POA: Diagnosis not present

## 2018-08-03 DIAGNOSIS — S24104S Unspecified injury at T11-T12 level of thoracic spinal cord, sequela: Secondary | ICD-10-CM | POA: Diagnosis not present

## 2018-08-03 DIAGNOSIS — Z466 Encounter for fitting and adjustment of urinary device: Secondary | ICD-10-CM | POA: Diagnosis not present

## 2018-08-03 DIAGNOSIS — N319 Neuromuscular dysfunction of bladder, unspecified: Secondary | ICD-10-CM | POA: Diagnosis not present

## 2018-08-03 DIAGNOSIS — L89154 Pressure ulcer of sacral region, stage 4: Secondary | ICD-10-CM | POA: Diagnosis not present

## 2018-08-03 DIAGNOSIS — J309 Allergic rhinitis, unspecified: Secondary | ICD-10-CM | POA: Diagnosis not present

## 2018-08-03 DIAGNOSIS — N400 Enlarged prostate without lower urinary tract symptoms: Secondary | ICD-10-CM | POA: Diagnosis not present

## 2018-08-06 DIAGNOSIS — N400 Enlarged prostate without lower urinary tract symptoms: Secondary | ICD-10-CM | POA: Diagnosis not present

## 2018-08-06 DIAGNOSIS — J309 Allergic rhinitis, unspecified: Secondary | ICD-10-CM | POA: Diagnosis not present

## 2018-08-06 DIAGNOSIS — S24104S Unspecified injury at T11-T12 level of thoracic spinal cord, sequela: Secondary | ICD-10-CM | POA: Diagnosis not present

## 2018-08-06 DIAGNOSIS — N319 Neuromuscular dysfunction of bladder, unspecified: Secondary | ICD-10-CM | POA: Diagnosis not present

## 2018-08-06 DIAGNOSIS — Z466 Encounter for fitting and adjustment of urinary device: Secondary | ICD-10-CM | POA: Diagnosis not present

## 2018-08-06 DIAGNOSIS — G8220 Paraplegia, unspecified: Secondary | ICD-10-CM | POA: Diagnosis not present

## 2018-08-06 DIAGNOSIS — D509 Iron deficiency anemia, unspecified: Secondary | ICD-10-CM | POA: Diagnosis not present

## 2018-08-06 DIAGNOSIS — L89154 Pressure ulcer of sacral region, stage 4: Secondary | ICD-10-CM | POA: Diagnosis not present

## 2018-08-06 DIAGNOSIS — I10 Essential (primary) hypertension: Secondary | ICD-10-CM | POA: Diagnosis not present

## 2018-08-07 DIAGNOSIS — Z466 Encounter for fitting and adjustment of urinary device: Secondary | ICD-10-CM | POA: Diagnosis not present

## 2018-08-07 DIAGNOSIS — N319 Neuromuscular dysfunction of bladder, unspecified: Secondary | ICD-10-CM | POA: Diagnosis not present

## 2018-08-07 DIAGNOSIS — J309 Allergic rhinitis, unspecified: Secondary | ICD-10-CM | POA: Diagnosis not present

## 2018-08-07 DIAGNOSIS — S24104S Unspecified injury at T11-T12 level of thoracic spinal cord, sequela: Secondary | ICD-10-CM | POA: Diagnosis not present

## 2018-08-07 DIAGNOSIS — I10 Essential (primary) hypertension: Secondary | ICD-10-CM | POA: Diagnosis not present

## 2018-08-07 DIAGNOSIS — D509 Iron deficiency anemia, unspecified: Secondary | ICD-10-CM | POA: Diagnosis not present

## 2018-08-07 DIAGNOSIS — N400 Enlarged prostate without lower urinary tract symptoms: Secondary | ICD-10-CM | POA: Diagnosis not present

## 2018-08-07 DIAGNOSIS — G8220 Paraplegia, unspecified: Secondary | ICD-10-CM | POA: Diagnosis not present

## 2018-08-07 DIAGNOSIS — L89154 Pressure ulcer of sacral region, stage 4: Secondary | ICD-10-CM | POA: Diagnosis not present

## 2018-08-09 DIAGNOSIS — N400 Enlarged prostate without lower urinary tract symptoms: Secondary | ICD-10-CM | POA: Diagnosis not present

## 2018-08-09 DIAGNOSIS — I10 Essential (primary) hypertension: Secondary | ICD-10-CM | POA: Diagnosis not present

## 2018-08-09 DIAGNOSIS — N319 Neuromuscular dysfunction of bladder, unspecified: Secondary | ICD-10-CM | POA: Diagnosis not present

## 2018-08-09 DIAGNOSIS — D509 Iron deficiency anemia, unspecified: Secondary | ICD-10-CM | POA: Diagnosis not present

## 2018-08-09 DIAGNOSIS — J309 Allergic rhinitis, unspecified: Secondary | ICD-10-CM | POA: Diagnosis not present

## 2018-08-09 DIAGNOSIS — G8220 Paraplegia, unspecified: Secondary | ICD-10-CM | POA: Diagnosis not present

## 2018-08-09 DIAGNOSIS — Z466 Encounter for fitting and adjustment of urinary device: Secondary | ICD-10-CM | POA: Diagnosis not present

## 2018-08-09 DIAGNOSIS — L89154 Pressure ulcer of sacral region, stage 4: Secondary | ICD-10-CM | POA: Diagnosis not present

## 2018-08-09 DIAGNOSIS — S24104S Unspecified injury at T11-T12 level of thoracic spinal cord, sequela: Secondary | ICD-10-CM | POA: Diagnosis not present

## 2018-08-10 DIAGNOSIS — N400 Enlarged prostate without lower urinary tract symptoms: Secondary | ICD-10-CM | POA: Diagnosis not present

## 2018-08-10 DIAGNOSIS — N319 Neuromuscular dysfunction of bladder, unspecified: Secondary | ICD-10-CM | POA: Diagnosis not present

## 2018-08-10 DIAGNOSIS — Z466 Encounter for fitting and adjustment of urinary device: Secondary | ICD-10-CM | POA: Diagnosis not present

## 2018-08-10 DIAGNOSIS — D509 Iron deficiency anemia, unspecified: Secondary | ICD-10-CM | POA: Diagnosis not present

## 2018-08-10 DIAGNOSIS — L89154 Pressure ulcer of sacral region, stage 4: Secondary | ICD-10-CM | POA: Diagnosis not present

## 2018-08-10 DIAGNOSIS — S24104S Unspecified injury at T11-T12 level of thoracic spinal cord, sequela: Secondary | ICD-10-CM | POA: Diagnosis not present

## 2018-08-10 DIAGNOSIS — I10 Essential (primary) hypertension: Secondary | ICD-10-CM | POA: Diagnosis not present

## 2018-08-10 DIAGNOSIS — G8220 Paraplegia, unspecified: Secondary | ICD-10-CM | POA: Diagnosis not present

## 2018-08-10 DIAGNOSIS — J309 Allergic rhinitis, unspecified: Secondary | ICD-10-CM | POA: Diagnosis not present

## 2018-08-13 DIAGNOSIS — Z466 Encounter for fitting and adjustment of urinary device: Secondary | ICD-10-CM | POA: Diagnosis not present

## 2018-08-13 DIAGNOSIS — I10 Essential (primary) hypertension: Secondary | ICD-10-CM | POA: Diagnosis not present

## 2018-08-13 DIAGNOSIS — G8220 Paraplegia, unspecified: Secondary | ICD-10-CM | POA: Diagnosis not present

## 2018-08-13 DIAGNOSIS — L89154 Pressure ulcer of sacral region, stage 4: Secondary | ICD-10-CM | POA: Diagnosis not present

## 2018-08-13 DIAGNOSIS — D509 Iron deficiency anemia, unspecified: Secondary | ICD-10-CM | POA: Diagnosis not present

## 2018-08-13 DIAGNOSIS — N400 Enlarged prostate without lower urinary tract symptoms: Secondary | ICD-10-CM | POA: Diagnosis not present

## 2018-08-13 DIAGNOSIS — J309 Allergic rhinitis, unspecified: Secondary | ICD-10-CM | POA: Diagnosis not present

## 2018-08-13 DIAGNOSIS — S24104S Unspecified injury at T11-T12 level of thoracic spinal cord, sequela: Secondary | ICD-10-CM | POA: Diagnosis not present

## 2018-08-13 DIAGNOSIS — N319 Neuromuscular dysfunction of bladder, unspecified: Secondary | ICD-10-CM | POA: Diagnosis not present

## 2018-08-14 DIAGNOSIS — Z466 Encounter for fitting and adjustment of urinary device: Secondary | ICD-10-CM | POA: Diagnosis not present

## 2018-08-14 DIAGNOSIS — L89154 Pressure ulcer of sacral region, stage 4: Secondary | ICD-10-CM | POA: Diagnosis not present

## 2018-08-14 DIAGNOSIS — I10 Essential (primary) hypertension: Secondary | ICD-10-CM | POA: Diagnosis not present

## 2018-08-14 DIAGNOSIS — J309 Allergic rhinitis, unspecified: Secondary | ICD-10-CM | POA: Diagnosis not present

## 2018-08-14 DIAGNOSIS — N400 Enlarged prostate without lower urinary tract symptoms: Secondary | ICD-10-CM | POA: Diagnosis not present

## 2018-08-14 DIAGNOSIS — S24104S Unspecified injury at T11-T12 level of thoracic spinal cord, sequela: Secondary | ICD-10-CM | POA: Diagnosis not present

## 2018-08-14 DIAGNOSIS — D509 Iron deficiency anemia, unspecified: Secondary | ICD-10-CM | POA: Diagnosis not present

## 2018-08-14 DIAGNOSIS — G8220 Paraplegia, unspecified: Secondary | ICD-10-CM | POA: Diagnosis not present

## 2018-08-14 DIAGNOSIS — N319 Neuromuscular dysfunction of bladder, unspecified: Secondary | ICD-10-CM | POA: Diagnosis not present

## 2018-08-16 ENCOUNTER — Telehealth: Payer: Self-pay | Admitting: Urology

## 2018-08-16 DIAGNOSIS — Z466 Encounter for fitting and adjustment of urinary device: Secondary | ICD-10-CM | POA: Diagnosis not present

## 2018-08-16 DIAGNOSIS — D509 Iron deficiency anemia, unspecified: Secondary | ICD-10-CM | POA: Diagnosis not present

## 2018-08-16 DIAGNOSIS — J309 Allergic rhinitis, unspecified: Secondary | ICD-10-CM | POA: Diagnosis not present

## 2018-08-16 DIAGNOSIS — L89154 Pressure ulcer of sacral region, stage 4: Secondary | ICD-10-CM | POA: Diagnosis not present

## 2018-08-16 DIAGNOSIS — G8220 Paraplegia, unspecified: Secondary | ICD-10-CM | POA: Diagnosis not present

## 2018-08-16 DIAGNOSIS — N319 Neuromuscular dysfunction of bladder, unspecified: Secondary | ICD-10-CM | POA: Diagnosis not present

## 2018-08-16 DIAGNOSIS — I10 Essential (primary) hypertension: Secondary | ICD-10-CM | POA: Diagnosis not present

## 2018-08-16 DIAGNOSIS — N400 Enlarged prostate without lower urinary tract symptoms: Secondary | ICD-10-CM | POA: Diagnosis not present

## 2018-08-16 DIAGNOSIS — S24104S Unspecified injury at T11-T12 level of thoracic spinal cord, sequela: Secondary | ICD-10-CM | POA: Diagnosis not present

## 2018-08-16 NOTE — Telephone Encounter (Signed)
Okay to have someone bring in a sample in a sterile container

## 2018-08-16 NOTE — Telephone Encounter (Signed)
Please see message below and advise, thanks.

## 2018-08-16 NOTE — Telephone Encounter (Signed)
Patient called the office today to request having someone bring a urine sample in a sterile container to check for UTI.  He states that he has been having symptoms for a couple days.  I was able to move his appointment sooner to Tues. 08/21/18.  He cannot be seen any sooner due to the 3-day requirement for transportation requests.  He states that he does have a sterile urine cup that he can have his caregiver bring the sample to our office.   Please note:  Patient was last seen by provider on 08/25/17.  Please advise on how we should proceed.

## 2018-08-17 DIAGNOSIS — J309 Allergic rhinitis, unspecified: Secondary | ICD-10-CM | POA: Diagnosis not present

## 2018-08-17 DIAGNOSIS — S24104S Unspecified injury at T11-T12 level of thoracic spinal cord, sequela: Secondary | ICD-10-CM | POA: Diagnosis not present

## 2018-08-17 DIAGNOSIS — D509 Iron deficiency anemia, unspecified: Secondary | ICD-10-CM | POA: Diagnosis not present

## 2018-08-17 DIAGNOSIS — G8220 Paraplegia, unspecified: Secondary | ICD-10-CM | POA: Diagnosis not present

## 2018-08-17 DIAGNOSIS — L89154 Pressure ulcer of sacral region, stage 4: Secondary | ICD-10-CM | POA: Diagnosis not present

## 2018-08-17 DIAGNOSIS — Z466 Encounter for fitting and adjustment of urinary device: Secondary | ICD-10-CM | POA: Diagnosis not present

## 2018-08-17 DIAGNOSIS — N400 Enlarged prostate without lower urinary tract symptoms: Secondary | ICD-10-CM | POA: Diagnosis not present

## 2018-08-17 DIAGNOSIS — I10 Essential (primary) hypertension: Secondary | ICD-10-CM | POA: Diagnosis not present

## 2018-08-17 DIAGNOSIS — N319 Neuromuscular dysfunction of bladder, unspecified: Secondary | ICD-10-CM | POA: Diagnosis not present

## 2018-08-17 NOTE — Telephone Encounter (Signed)
Pt will wait until appt.

## 2018-08-21 ENCOUNTER — Encounter

## 2018-08-21 ENCOUNTER — Other Ambulatory Visit: Payer: Self-pay

## 2018-08-21 ENCOUNTER — Ambulatory Visit: Payer: Medicare HMO | Admitting: Urology

## 2018-08-21 ENCOUNTER — Encounter: Payer: Self-pay | Admitting: Urology

## 2018-08-21 VITALS — BP 128/68 | HR 71 | Ht 75.0 in | Wt 209.0 lb

## 2018-08-21 DIAGNOSIS — I10 Essential (primary) hypertension: Secondary | ICD-10-CM | POA: Diagnosis not present

## 2018-08-21 DIAGNOSIS — S24104S Unspecified injury at T11-T12 level of thoracic spinal cord, sequela: Secondary | ICD-10-CM | POA: Diagnosis not present

## 2018-08-21 DIAGNOSIS — L89154 Pressure ulcer of sacral region, stage 4: Secondary | ICD-10-CM | POA: Diagnosis not present

## 2018-08-21 DIAGNOSIS — G8220 Paraplegia, unspecified: Secondary | ICD-10-CM | POA: Diagnosis not present

## 2018-08-21 DIAGNOSIS — N400 Enlarged prostate without lower urinary tract symptoms: Secondary | ICD-10-CM | POA: Diagnosis not present

## 2018-08-21 DIAGNOSIS — Z87828 Personal history of other (healed) physical injury and trauma: Secondary | ICD-10-CM

## 2018-08-21 DIAGNOSIS — D509 Iron deficiency anemia, unspecified: Secondary | ICD-10-CM | POA: Diagnosis not present

## 2018-08-21 DIAGNOSIS — J309 Allergic rhinitis, unspecified: Secondary | ICD-10-CM | POA: Diagnosis not present

## 2018-08-21 DIAGNOSIS — N390 Urinary tract infection, site not specified: Secondary | ICD-10-CM | POA: Diagnosis not present

## 2018-08-21 DIAGNOSIS — N319 Neuromuscular dysfunction of bladder, unspecified: Secondary | ICD-10-CM

## 2018-08-21 DIAGNOSIS — Z466 Encounter for fitting and adjustment of urinary device: Secondary | ICD-10-CM | POA: Diagnosis not present

## 2018-08-21 LAB — URINALYSIS, COMPLETE
Bilirubin, UA: NEGATIVE
Glucose, UA: NEGATIVE
Ketones, UA: NEGATIVE
Nitrite, UA: NEGATIVE
Protein, UA: NEGATIVE
RBC, UA: NEGATIVE
SPEC GRAV UA: 1.015 (ref 1.005–1.030)
Urobilinogen, Ur: 1 mg/dL (ref 0.2–1.0)
pH, UA: 6.5 (ref 5.0–7.5)

## 2018-08-21 LAB — MICROSCOPIC EXAMINATION: RBC, UA: NONE SEEN /hpf (ref 0–2)
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
## 2018-08-22 DIAGNOSIS — D509 Iron deficiency anemia, unspecified: Secondary | ICD-10-CM | POA: Diagnosis not present

## 2018-08-22 DIAGNOSIS — N400 Enlarged prostate without lower urinary tract symptoms: Secondary | ICD-10-CM | POA: Diagnosis not present

## 2018-08-22 DIAGNOSIS — J309 Allergic rhinitis, unspecified: Secondary | ICD-10-CM | POA: Diagnosis not present

## 2018-08-22 DIAGNOSIS — G8220 Paraplegia, unspecified: Secondary | ICD-10-CM | POA: Diagnosis not present

## 2018-08-22 DIAGNOSIS — I10 Essential (primary) hypertension: Secondary | ICD-10-CM | POA: Diagnosis not present

## 2018-08-22 DIAGNOSIS — L89154 Pressure ulcer of sacral region, stage 4: Secondary | ICD-10-CM | POA: Diagnosis not present

## 2018-08-22 DIAGNOSIS — Z466 Encounter for fitting and adjustment of urinary device: Secondary | ICD-10-CM | POA: Diagnosis not present

## 2018-08-22 DIAGNOSIS — N319 Neuromuscular dysfunction of bladder, unspecified: Secondary | ICD-10-CM | POA: Diagnosis not present

## 2018-08-22 DIAGNOSIS — S24104S Unspecified injury at T11-T12 level of thoracic spinal cord, sequela: Secondary | ICD-10-CM | POA: Diagnosis not present

## 2018-08-24 DIAGNOSIS — L89154 Pressure ulcer of sacral region, stage 4: Secondary | ICD-10-CM | POA: Diagnosis not present

## 2018-08-24 DIAGNOSIS — I10 Essential (primary) hypertension: Secondary | ICD-10-CM | POA: Diagnosis not present

## 2018-08-24 DIAGNOSIS — S24104S Unspecified injury at T11-T12 level of thoracic spinal cord, sequela: Secondary | ICD-10-CM | POA: Diagnosis not present

## 2018-08-24 DIAGNOSIS — N400 Enlarged prostate without lower urinary tract symptoms: Secondary | ICD-10-CM | POA: Diagnosis not present

## 2018-08-24 DIAGNOSIS — N319 Neuromuscular dysfunction of bladder, unspecified: Secondary | ICD-10-CM | POA: Diagnosis not present

## 2018-08-24 DIAGNOSIS — D509 Iron deficiency anemia, unspecified: Secondary | ICD-10-CM | POA: Diagnosis not present

## 2018-08-24 DIAGNOSIS — J309 Allergic rhinitis, unspecified: Secondary | ICD-10-CM | POA: Diagnosis not present

## 2018-08-24 DIAGNOSIS — Z466 Encounter for fitting and adjustment of urinary device: Secondary | ICD-10-CM | POA: Diagnosis not present

## 2018-08-24 DIAGNOSIS — G8220 Paraplegia, unspecified: Secondary | ICD-10-CM | POA: Diagnosis not present

## 2018-08-25 LAB — CULTURE, URINE COMPREHENSIVE

## 2018-08-27 ENCOUNTER — Telehealth: Payer: Self-pay | Admitting: *Deleted

## 2018-08-27 ENCOUNTER — Ambulatory Visit: Payer: Medicare HMO | Admitting: Urology

## 2018-08-27 NOTE — Telephone Encounter (Signed)
-----   Message from Abbie Sons, MD sent at 08/26/2018 11:53 AM EDT ----- Urine culture had a very low level of bacteria and I would not recommend treatment unless he is having significant symptoms.

## 2018-08-27 NOTE — Telephone Encounter (Signed)
Informed patient, he has not been having any symptoms. He is feeling better. Verbalized understanding.

## 2018-08-28 DIAGNOSIS — L89154 Pressure ulcer of sacral region, stage 4: Secondary | ICD-10-CM | POA: Diagnosis not present

## 2018-08-28 DIAGNOSIS — G8220 Paraplegia, unspecified: Secondary | ICD-10-CM | POA: Diagnosis not present

## 2018-08-28 DIAGNOSIS — I10 Essential (primary) hypertension: Secondary | ICD-10-CM | POA: Diagnosis not present

## 2018-08-28 DIAGNOSIS — S24104S Unspecified injury at T11-T12 level of thoracic spinal cord, sequela: Secondary | ICD-10-CM | POA: Diagnosis not present

## 2018-08-28 DIAGNOSIS — D509 Iron deficiency anemia, unspecified: Secondary | ICD-10-CM | POA: Diagnosis not present

## 2018-08-28 DIAGNOSIS — Z466 Encounter for fitting and adjustment of urinary device: Secondary | ICD-10-CM | POA: Diagnosis not present

## 2018-08-28 DIAGNOSIS — N319 Neuromuscular dysfunction of bladder, unspecified: Secondary | ICD-10-CM | POA: Diagnosis not present

## 2018-08-28 DIAGNOSIS — J309 Allergic rhinitis, unspecified: Secondary | ICD-10-CM | POA: Diagnosis not present

## 2018-08-28 DIAGNOSIS — N400 Enlarged prostate without lower urinary tract symptoms: Secondary | ICD-10-CM | POA: Diagnosis not present

## 2018-09-04 DIAGNOSIS — D509 Iron deficiency anemia, unspecified: Secondary | ICD-10-CM | POA: Diagnosis not present

## 2018-09-04 DIAGNOSIS — N400 Enlarged prostate without lower urinary tract symptoms: Secondary | ICD-10-CM | POA: Diagnosis not present

## 2018-09-04 DIAGNOSIS — G8220 Paraplegia, unspecified: Secondary | ICD-10-CM | POA: Diagnosis not present

## 2018-09-04 DIAGNOSIS — S24104S Unspecified injury at T11-T12 level of thoracic spinal cord, sequela: Secondary | ICD-10-CM | POA: Diagnosis not present

## 2018-09-04 DIAGNOSIS — I10 Essential (primary) hypertension: Secondary | ICD-10-CM | POA: Diagnosis not present

## 2018-09-04 DIAGNOSIS — J309 Allergic rhinitis, unspecified: Secondary | ICD-10-CM | POA: Diagnosis not present

## 2018-09-04 DIAGNOSIS — L89154 Pressure ulcer of sacral region, stage 4: Secondary | ICD-10-CM | POA: Diagnosis not present

## 2018-09-04 DIAGNOSIS — N319 Neuromuscular dysfunction of bladder, unspecified: Secondary | ICD-10-CM | POA: Diagnosis not present

## 2018-09-04 DIAGNOSIS — Z466 Encounter for fitting and adjustment of urinary device: Secondary | ICD-10-CM | POA: Diagnosis not present

## 2018-09-07 DIAGNOSIS — N319 Neuromuscular dysfunction of bladder, unspecified: Secondary | ICD-10-CM | POA: Diagnosis not present

## 2018-09-07 DIAGNOSIS — Z466 Encounter for fitting and adjustment of urinary device: Secondary | ICD-10-CM | POA: Diagnosis not present

## 2018-09-07 DIAGNOSIS — I10 Essential (primary) hypertension: Secondary | ICD-10-CM | POA: Diagnosis not present

## 2018-09-07 DIAGNOSIS — J309 Allergic rhinitis, unspecified: Secondary | ICD-10-CM | POA: Diagnosis not present

## 2018-09-07 DIAGNOSIS — G8220 Paraplegia, unspecified: Secondary | ICD-10-CM | POA: Diagnosis not present

## 2018-09-07 DIAGNOSIS — S24104S Unspecified injury at T11-T12 level of thoracic spinal cord, sequela: Secondary | ICD-10-CM | POA: Diagnosis not present

## 2018-09-07 DIAGNOSIS — L89154 Pressure ulcer of sacral region, stage 4: Secondary | ICD-10-CM | POA: Diagnosis not present

## 2018-09-07 DIAGNOSIS — D509 Iron deficiency anemia, unspecified: Secondary | ICD-10-CM | POA: Diagnosis not present

## 2018-09-07 DIAGNOSIS — N400 Enlarged prostate without lower urinary tract symptoms: Secondary | ICD-10-CM | POA: Diagnosis not present

## 2018-09-11 DIAGNOSIS — L89154 Pressure ulcer of sacral region, stage 4: Secondary | ICD-10-CM | POA: Diagnosis not present

## 2018-09-11 DIAGNOSIS — S24104S Unspecified injury at T11-T12 level of thoracic spinal cord, sequela: Secondary | ICD-10-CM | POA: Diagnosis not present

## 2018-09-11 DIAGNOSIS — I10 Essential (primary) hypertension: Secondary | ICD-10-CM | POA: Diagnosis not present

## 2018-09-11 DIAGNOSIS — D509 Iron deficiency anemia, unspecified: Secondary | ICD-10-CM | POA: Diagnosis not present

## 2018-09-11 DIAGNOSIS — N319 Neuromuscular dysfunction of bladder, unspecified: Secondary | ICD-10-CM | POA: Diagnosis not present

## 2018-09-11 DIAGNOSIS — N400 Enlarged prostate without lower urinary tract symptoms: Secondary | ICD-10-CM | POA: Diagnosis not present

## 2018-09-11 DIAGNOSIS — Z466 Encounter for fitting and adjustment of urinary device: Secondary | ICD-10-CM | POA: Diagnosis not present

## 2018-09-11 DIAGNOSIS — J309 Allergic rhinitis, unspecified: Secondary | ICD-10-CM | POA: Diagnosis not present

## 2018-09-11 DIAGNOSIS — G8220 Paraplegia, unspecified: Secondary | ICD-10-CM | POA: Diagnosis not present

## 2018-09-14 DIAGNOSIS — G8220 Paraplegia, unspecified: Secondary | ICD-10-CM | POA: Diagnosis not present

## 2018-09-14 DIAGNOSIS — Z466 Encounter for fitting and adjustment of urinary device: Secondary | ICD-10-CM | POA: Diagnosis not present

## 2018-09-14 DIAGNOSIS — J309 Allergic rhinitis, unspecified: Secondary | ICD-10-CM | POA: Diagnosis not present

## 2018-09-14 DIAGNOSIS — L89154 Pressure ulcer of sacral region, stage 4: Secondary | ICD-10-CM | POA: Diagnosis not present

## 2018-09-14 DIAGNOSIS — S24104S Unspecified injury at T11-T12 level of thoracic spinal cord, sequela: Secondary | ICD-10-CM | POA: Diagnosis not present

## 2018-09-14 DIAGNOSIS — N319 Neuromuscular dysfunction of bladder, unspecified: Secondary | ICD-10-CM | POA: Diagnosis not present

## 2018-09-14 DIAGNOSIS — D509 Iron deficiency anemia, unspecified: Secondary | ICD-10-CM | POA: Diagnosis not present

## 2018-09-14 DIAGNOSIS — N400 Enlarged prostate without lower urinary tract symptoms: Secondary | ICD-10-CM | POA: Diagnosis not present

## 2018-09-14 DIAGNOSIS — I10 Essential (primary) hypertension: Secondary | ICD-10-CM | POA: Diagnosis not present

## 2018-09-18 DIAGNOSIS — L89154 Pressure ulcer of sacral region, stage 4: Secondary | ICD-10-CM | POA: Diagnosis not present

## 2018-09-18 DIAGNOSIS — G8220 Paraplegia, unspecified: Secondary | ICD-10-CM | POA: Diagnosis not present

## 2018-09-18 DIAGNOSIS — D509 Iron deficiency anemia, unspecified: Secondary | ICD-10-CM | POA: Diagnosis not present

## 2018-09-18 DIAGNOSIS — Z466 Encounter for fitting and adjustment of urinary device: Secondary | ICD-10-CM | POA: Diagnosis not present

## 2018-09-18 DIAGNOSIS — J309 Allergic rhinitis, unspecified: Secondary | ICD-10-CM | POA: Diagnosis not present

## 2018-09-18 DIAGNOSIS — N319 Neuromuscular dysfunction of bladder, unspecified: Secondary | ICD-10-CM | POA: Diagnosis not present

## 2018-09-18 DIAGNOSIS — S24104S Unspecified injury at T11-T12 level of thoracic spinal cord, sequela: Secondary | ICD-10-CM | POA: Diagnosis not present

## 2018-09-18 DIAGNOSIS — I10 Essential (primary) hypertension: Secondary | ICD-10-CM | POA: Diagnosis not present

## 2018-09-18 DIAGNOSIS — N400 Enlarged prostate without lower urinary tract symptoms: Secondary | ICD-10-CM | POA: Diagnosis not present

## 2018-09-19 DIAGNOSIS — Z136 Encounter for screening for cardiovascular disorders: Secondary | ICD-10-CM | POA: Diagnosis not present

## 2018-09-19 DIAGNOSIS — E8809 Other disorders of plasma-protein metabolism, not elsewhere classified: Secondary | ICD-10-CM | POA: Diagnosis not present

## 2018-09-19 DIAGNOSIS — I10 Essential (primary) hypertension: Secondary | ICD-10-CM | POA: Diagnosis not present

## 2018-09-21 DIAGNOSIS — J309 Allergic rhinitis, unspecified: Secondary | ICD-10-CM | POA: Diagnosis not present

## 2018-09-21 DIAGNOSIS — Z466 Encounter for fitting and adjustment of urinary device: Secondary | ICD-10-CM | POA: Diagnosis not present

## 2018-09-21 DIAGNOSIS — S24104S Unspecified injury at T11-T12 level of thoracic spinal cord, sequela: Secondary | ICD-10-CM | POA: Diagnosis not present

## 2018-09-21 DIAGNOSIS — G8220 Paraplegia, unspecified: Secondary | ICD-10-CM | POA: Diagnosis not present

## 2018-09-21 DIAGNOSIS — L89154 Pressure ulcer of sacral region, stage 4: Secondary | ICD-10-CM | POA: Diagnosis not present

## 2018-09-21 DIAGNOSIS — D509 Iron deficiency anemia, unspecified: Secondary | ICD-10-CM | POA: Diagnosis not present

## 2018-09-21 DIAGNOSIS — N319 Neuromuscular dysfunction of bladder, unspecified: Secondary | ICD-10-CM | POA: Diagnosis not present

## 2018-09-21 DIAGNOSIS — I10 Essential (primary) hypertension: Secondary | ICD-10-CM | POA: Diagnosis not present

## 2018-09-21 DIAGNOSIS — N400 Enlarged prostate without lower urinary tract symptoms: Secondary | ICD-10-CM | POA: Diagnosis not present

## 2018-09-22 DIAGNOSIS — L89154 Pressure ulcer of sacral region, stage 4: Secondary | ICD-10-CM | POA: Diagnosis not present

## 2018-09-22 DIAGNOSIS — G8220 Paraplegia, unspecified: Secondary | ICD-10-CM | POA: Diagnosis not present

## 2018-09-25 DIAGNOSIS — S24104S Unspecified injury at T11-T12 level of thoracic spinal cord, sequela: Secondary | ICD-10-CM | POA: Diagnosis not present

## 2018-09-25 DIAGNOSIS — Z466 Encounter for fitting and adjustment of urinary device: Secondary | ICD-10-CM | POA: Diagnosis not present

## 2018-09-25 DIAGNOSIS — L89154 Pressure ulcer of sacral region, stage 4: Secondary | ICD-10-CM | POA: Diagnosis not present

## 2018-09-25 DIAGNOSIS — N319 Neuromuscular dysfunction of bladder, unspecified: Secondary | ICD-10-CM | POA: Diagnosis not present

## 2018-09-25 DIAGNOSIS — N400 Enlarged prostate without lower urinary tract symptoms: Secondary | ICD-10-CM | POA: Diagnosis not present

## 2018-09-25 DIAGNOSIS — G8220 Paraplegia, unspecified: Secondary | ICD-10-CM | POA: Diagnosis not present

## 2018-09-25 DIAGNOSIS — D509 Iron deficiency anemia, unspecified: Secondary | ICD-10-CM | POA: Diagnosis not present

## 2018-09-25 DIAGNOSIS — J309 Allergic rhinitis, unspecified: Secondary | ICD-10-CM | POA: Diagnosis not present

## 2018-09-25 DIAGNOSIS — I10 Essential (primary) hypertension: Secondary | ICD-10-CM | POA: Diagnosis not present

## 2018-09-26 DIAGNOSIS — J309 Allergic rhinitis, unspecified: Secondary | ICD-10-CM | POA: Diagnosis not present

## 2018-09-26 DIAGNOSIS — S24104S Unspecified injury at T11-T12 level of thoracic spinal cord, sequela: Secondary | ICD-10-CM | POA: Diagnosis not present

## 2018-09-26 DIAGNOSIS — M4000 Postural kyphosis, site unspecified: Secondary | ICD-10-CM | POA: Diagnosis not present

## 2018-09-26 DIAGNOSIS — Z0000 Encounter for general adult medical examination without abnormal findings: Secondary | ICD-10-CM | POA: Diagnosis not present

## 2018-09-26 DIAGNOSIS — N319 Neuromuscular dysfunction of bladder, unspecified: Secondary | ICD-10-CM | POA: Diagnosis not present

## 2018-09-26 DIAGNOSIS — L89154 Pressure ulcer of sacral region, stage 4: Secondary | ICD-10-CM | POA: Diagnosis not present

## 2018-09-26 DIAGNOSIS — I10 Essential (primary) hypertension: Secondary | ICD-10-CM | POA: Diagnosis not present

## 2018-09-26 DIAGNOSIS — G8220 Paraplegia, unspecified: Secondary | ICD-10-CM | POA: Diagnosis not present

## 2018-09-26 DIAGNOSIS — D509 Iron deficiency anemia, unspecified: Secondary | ICD-10-CM | POA: Diagnosis not present

## 2018-09-28 DIAGNOSIS — D509 Iron deficiency anemia, unspecified: Secondary | ICD-10-CM | POA: Diagnosis not present

## 2018-09-28 DIAGNOSIS — G8220 Paraplegia, unspecified: Secondary | ICD-10-CM | POA: Diagnosis not present

## 2018-09-28 DIAGNOSIS — I10 Essential (primary) hypertension: Secondary | ICD-10-CM | POA: Diagnosis not present

## 2018-09-28 DIAGNOSIS — J309 Allergic rhinitis, unspecified: Secondary | ICD-10-CM | POA: Diagnosis not present

## 2018-09-28 DIAGNOSIS — S24104S Unspecified injury at T11-T12 level of thoracic spinal cord, sequela: Secondary | ICD-10-CM | POA: Diagnosis not present

## 2018-09-28 DIAGNOSIS — N319 Neuromuscular dysfunction of bladder, unspecified: Secondary | ICD-10-CM | POA: Diagnosis not present

## 2018-09-28 DIAGNOSIS — L89154 Pressure ulcer of sacral region, stage 4: Secondary | ICD-10-CM | POA: Diagnosis not present

## 2018-09-28 DIAGNOSIS — Z466 Encounter for fitting and adjustment of urinary device: Secondary | ICD-10-CM | POA: Diagnosis not present

## 2018-09-28 DIAGNOSIS — N400 Enlarged prostate without lower urinary tract symptoms: Secondary | ICD-10-CM | POA: Diagnosis not present

## 2018-10-02 DIAGNOSIS — J309 Allergic rhinitis, unspecified: Secondary | ICD-10-CM | POA: Diagnosis not present

## 2018-10-02 DIAGNOSIS — I10 Essential (primary) hypertension: Secondary | ICD-10-CM | POA: Diagnosis not present

## 2018-10-02 DIAGNOSIS — L89154 Pressure ulcer of sacral region, stage 4: Secondary | ICD-10-CM | POA: Diagnosis not present

## 2018-10-02 DIAGNOSIS — N400 Enlarged prostate without lower urinary tract symptoms: Secondary | ICD-10-CM | POA: Diagnosis not present

## 2018-10-02 DIAGNOSIS — Z466 Encounter for fitting and adjustment of urinary device: Secondary | ICD-10-CM | POA: Diagnosis not present

## 2018-10-02 DIAGNOSIS — N319 Neuromuscular dysfunction of bladder, unspecified: Secondary | ICD-10-CM | POA: Diagnosis not present

## 2018-10-02 DIAGNOSIS — S24104S Unspecified injury at T11-T12 level of thoracic spinal cord, sequela: Secondary | ICD-10-CM | POA: Diagnosis not present

## 2018-10-02 DIAGNOSIS — G8220 Paraplegia, unspecified: Secondary | ICD-10-CM | POA: Diagnosis not present

## 2018-10-02 DIAGNOSIS — D509 Iron deficiency anemia, unspecified: Secondary | ICD-10-CM | POA: Diagnosis not present

## 2018-10-09 DIAGNOSIS — N319 Neuromuscular dysfunction of bladder, unspecified: Secondary | ICD-10-CM | POA: Diagnosis not present

## 2018-10-09 DIAGNOSIS — J309 Allergic rhinitis, unspecified: Secondary | ICD-10-CM | POA: Diagnosis not present

## 2018-10-09 DIAGNOSIS — N400 Enlarged prostate without lower urinary tract symptoms: Secondary | ICD-10-CM | POA: Diagnosis not present

## 2018-10-09 DIAGNOSIS — I10 Essential (primary) hypertension: Secondary | ICD-10-CM | POA: Diagnosis not present

## 2018-10-09 DIAGNOSIS — D509 Iron deficiency anemia, unspecified: Secondary | ICD-10-CM | POA: Diagnosis not present

## 2018-10-09 DIAGNOSIS — G8220 Paraplegia, unspecified: Secondary | ICD-10-CM | POA: Diagnosis not present

## 2018-10-09 DIAGNOSIS — S24104S Unspecified injury at T11-T12 level of thoracic spinal cord, sequela: Secondary | ICD-10-CM | POA: Diagnosis not present

## 2018-10-09 DIAGNOSIS — Z466 Encounter for fitting and adjustment of urinary device: Secondary | ICD-10-CM | POA: Diagnosis not present

## 2018-10-09 DIAGNOSIS — L89154 Pressure ulcer of sacral region, stage 4: Secondary | ICD-10-CM | POA: Diagnosis not present

## 2018-10-12 DIAGNOSIS — G8220 Paraplegia, unspecified: Secondary | ICD-10-CM | POA: Diagnosis not present

## 2018-10-12 DIAGNOSIS — L89154 Pressure ulcer of sacral region, stage 4: Secondary | ICD-10-CM | POA: Diagnosis not present

## 2018-10-12 DIAGNOSIS — Z466 Encounter for fitting and adjustment of urinary device: Secondary | ICD-10-CM | POA: Diagnosis not present

## 2018-10-12 DIAGNOSIS — N400 Enlarged prostate without lower urinary tract symptoms: Secondary | ICD-10-CM | POA: Diagnosis not present

## 2018-10-12 DIAGNOSIS — D509 Iron deficiency anemia, unspecified: Secondary | ICD-10-CM | POA: Diagnosis not present

## 2018-10-12 DIAGNOSIS — J309 Allergic rhinitis, unspecified: Secondary | ICD-10-CM | POA: Diagnosis not present

## 2018-10-12 DIAGNOSIS — I10 Essential (primary) hypertension: Secondary | ICD-10-CM | POA: Diagnosis not present

## 2018-10-12 DIAGNOSIS — S24104S Unspecified injury at T11-T12 level of thoracic spinal cord, sequela: Secondary | ICD-10-CM | POA: Diagnosis not present

## 2018-10-12 DIAGNOSIS — N319 Neuromuscular dysfunction of bladder, unspecified: Secondary | ICD-10-CM | POA: Diagnosis not present

## 2018-10-16 DIAGNOSIS — J309 Allergic rhinitis, unspecified: Secondary | ICD-10-CM | POA: Diagnosis not present

## 2018-10-16 DIAGNOSIS — G8220 Paraplegia, unspecified: Secondary | ICD-10-CM | POA: Diagnosis not present

## 2018-10-16 DIAGNOSIS — D509 Iron deficiency anemia, unspecified: Secondary | ICD-10-CM | POA: Diagnosis not present

## 2018-10-16 DIAGNOSIS — N319 Neuromuscular dysfunction of bladder, unspecified: Secondary | ICD-10-CM | POA: Diagnosis not present

## 2018-10-16 DIAGNOSIS — Z466 Encounter for fitting and adjustment of urinary device: Secondary | ICD-10-CM | POA: Diagnosis not present

## 2018-10-16 DIAGNOSIS — N400 Enlarged prostate without lower urinary tract symptoms: Secondary | ICD-10-CM | POA: Diagnosis not present

## 2018-10-16 DIAGNOSIS — S24104S Unspecified injury at T11-T12 level of thoracic spinal cord, sequela: Secondary | ICD-10-CM | POA: Diagnosis not present

## 2018-10-16 DIAGNOSIS — I10 Essential (primary) hypertension: Secondary | ICD-10-CM | POA: Diagnosis not present

## 2018-10-16 DIAGNOSIS — L89154 Pressure ulcer of sacral region, stage 4: Secondary | ICD-10-CM | POA: Diagnosis not present

## 2018-10-19 DIAGNOSIS — S24104S Unspecified injury at T11-T12 level of thoracic spinal cord, sequela: Secondary | ICD-10-CM | POA: Diagnosis not present

## 2018-10-19 DIAGNOSIS — N319 Neuromuscular dysfunction of bladder, unspecified: Secondary | ICD-10-CM | POA: Diagnosis not present

## 2018-10-19 DIAGNOSIS — Z466 Encounter for fitting and adjustment of urinary device: Secondary | ICD-10-CM | POA: Diagnosis not present

## 2018-10-19 DIAGNOSIS — I10 Essential (primary) hypertension: Secondary | ICD-10-CM | POA: Diagnosis not present

## 2018-10-19 DIAGNOSIS — L89154 Pressure ulcer of sacral region, stage 4: Secondary | ICD-10-CM | POA: Diagnosis not present

## 2018-10-19 DIAGNOSIS — D509 Iron deficiency anemia, unspecified: Secondary | ICD-10-CM | POA: Diagnosis not present

## 2018-10-19 DIAGNOSIS — N400 Enlarged prostate without lower urinary tract symptoms: Secondary | ICD-10-CM | POA: Diagnosis not present

## 2018-10-19 DIAGNOSIS — G8220 Paraplegia, unspecified: Secondary | ICD-10-CM | POA: Diagnosis not present

## 2018-10-19 DIAGNOSIS — J309 Allergic rhinitis, unspecified: Secondary | ICD-10-CM | POA: Diagnosis not present

## 2018-10-22 DIAGNOSIS — G8220 Paraplegia, unspecified: Secondary | ICD-10-CM | POA: Diagnosis not present

## 2018-10-22 DIAGNOSIS — L89154 Pressure ulcer of sacral region, stage 4: Secondary | ICD-10-CM | POA: Diagnosis not present

## 2018-10-23 DIAGNOSIS — Z466 Encounter for fitting and adjustment of urinary device: Secondary | ICD-10-CM | POA: Diagnosis not present

## 2018-10-23 DIAGNOSIS — S24104S Unspecified injury at T11-T12 level of thoracic spinal cord, sequela: Secondary | ICD-10-CM | POA: Diagnosis not present

## 2018-10-23 DIAGNOSIS — D509 Iron deficiency anemia, unspecified: Secondary | ICD-10-CM | POA: Diagnosis not present

## 2018-10-23 DIAGNOSIS — N400 Enlarged prostate without lower urinary tract symptoms: Secondary | ICD-10-CM | POA: Diagnosis not present

## 2018-10-23 DIAGNOSIS — N319 Neuromuscular dysfunction of bladder, unspecified: Secondary | ICD-10-CM | POA: Diagnosis not present

## 2018-10-23 DIAGNOSIS — G8220 Paraplegia, unspecified: Secondary | ICD-10-CM | POA: Diagnosis not present

## 2018-10-23 DIAGNOSIS — I10 Essential (primary) hypertension: Secondary | ICD-10-CM | POA: Diagnosis not present

## 2018-10-23 DIAGNOSIS — L89154 Pressure ulcer of sacral region, stage 4: Secondary | ICD-10-CM | POA: Diagnosis not present

## 2018-10-23 DIAGNOSIS — J309 Allergic rhinitis, unspecified: Secondary | ICD-10-CM | POA: Diagnosis not present

## 2018-10-25 DIAGNOSIS — S24104S Unspecified injury at T11-T12 level of thoracic spinal cord, sequela: Secondary | ICD-10-CM | POA: Diagnosis not present

## 2018-10-25 DIAGNOSIS — N319 Neuromuscular dysfunction of bladder, unspecified: Secondary | ICD-10-CM | POA: Diagnosis not present

## 2018-10-25 DIAGNOSIS — I10 Essential (primary) hypertension: Secondary | ICD-10-CM | POA: Diagnosis not present

## 2018-10-25 DIAGNOSIS — N400 Enlarged prostate without lower urinary tract symptoms: Secondary | ICD-10-CM | POA: Diagnosis not present

## 2018-10-25 DIAGNOSIS — D509 Iron deficiency anemia, unspecified: Secondary | ICD-10-CM | POA: Diagnosis not present

## 2018-10-25 DIAGNOSIS — J309 Allergic rhinitis, unspecified: Secondary | ICD-10-CM | POA: Diagnosis not present

## 2018-10-25 DIAGNOSIS — L89154 Pressure ulcer of sacral region, stage 4: Secondary | ICD-10-CM | POA: Diagnosis not present

## 2018-10-25 DIAGNOSIS — Z466 Encounter for fitting and adjustment of urinary device: Secondary | ICD-10-CM | POA: Diagnosis not present

## 2018-10-25 DIAGNOSIS — G8220 Paraplegia, unspecified: Secondary | ICD-10-CM | POA: Diagnosis not present

## 2018-10-26 DIAGNOSIS — L89154 Pressure ulcer of sacral region, stage 4: Secondary | ICD-10-CM | POA: Diagnosis not present

## 2018-10-26 DIAGNOSIS — D509 Iron deficiency anemia, unspecified: Secondary | ICD-10-CM | POA: Diagnosis not present

## 2018-10-26 DIAGNOSIS — I10 Essential (primary) hypertension: Secondary | ICD-10-CM | POA: Diagnosis not present

## 2018-10-26 DIAGNOSIS — J309 Allergic rhinitis, unspecified: Secondary | ICD-10-CM | POA: Diagnosis not present

## 2018-10-26 DIAGNOSIS — Z466 Encounter for fitting and adjustment of urinary device: Secondary | ICD-10-CM | POA: Diagnosis not present

## 2018-10-26 DIAGNOSIS — S24104S Unspecified injury at T11-T12 level of thoracic spinal cord, sequela: Secondary | ICD-10-CM | POA: Diagnosis not present

## 2018-10-26 DIAGNOSIS — N319 Neuromuscular dysfunction of bladder, unspecified: Secondary | ICD-10-CM | POA: Diagnosis not present

## 2018-10-26 DIAGNOSIS — N400 Enlarged prostate without lower urinary tract symptoms: Secondary | ICD-10-CM | POA: Diagnosis not present

## 2018-10-26 DIAGNOSIS — G8220 Paraplegia, unspecified: Secondary | ICD-10-CM | POA: Diagnosis not present

## 2018-10-30 DIAGNOSIS — Z466 Encounter for fitting and adjustment of urinary device: Secondary | ICD-10-CM | POA: Diagnosis not present

## 2018-10-30 DIAGNOSIS — N319 Neuromuscular dysfunction of bladder, unspecified: Secondary | ICD-10-CM | POA: Diagnosis not present

## 2018-10-30 DIAGNOSIS — N400 Enlarged prostate without lower urinary tract symptoms: Secondary | ICD-10-CM | POA: Diagnosis not present

## 2018-10-30 DIAGNOSIS — D509 Iron deficiency anemia, unspecified: Secondary | ICD-10-CM | POA: Diagnosis not present

## 2018-10-30 DIAGNOSIS — J309 Allergic rhinitis, unspecified: Secondary | ICD-10-CM | POA: Diagnosis not present

## 2018-10-30 DIAGNOSIS — S24104S Unspecified injury at T11-T12 level of thoracic spinal cord, sequela: Secondary | ICD-10-CM | POA: Diagnosis not present

## 2018-10-30 DIAGNOSIS — I10 Essential (primary) hypertension: Secondary | ICD-10-CM | POA: Diagnosis not present

## 2018-10-30 DIAGNOSIS — L89154 Pressure ulcer of sacral region, stage 4: Secondary | ICD-10-CM | POA: Diagnosis not present

## 2018-10-30 DIAGNOSIS — G8220 Paraplegia, unspecified: Secondary | ICD-10-CM | POA: Diagnosis not present

## 2018-11-02 DIAGNOSIS — Z466 Encounter for fitting and adjustment of urinary device: Secondary | ICD-10-CM | POA: Diagnosis not present

## 2018-11-02 DIAGNOSIS — G8220 Paraplegia, unspecified: Secondary | ICD-10-CM | POA: Diagnosis not present

## 2018-11-02 DIAGNOSIS — L89154 Pressure ulcer of sacral region, stage 4: Secondary | ICD-10-CM | POA: Diagnosis not present

## 2018-11-02 DIAGNOSIS — I10 Essential (primary) hypertension: Secondary | ICD-10-CM | POA: Diagnosis not present

## 2018-11-02 DIAGNOSIS — S24104S Unspecified injury at T11-T12 level of thoracic spinal cord, sequela: Secondary | ICD-10-CM | POA: Diagnosis not present

## 2018-11-02 DIAGNOSIS — J309 Allergic rhinitis, unspecified: Secondary | ICD-10-CM | POA: Diagnosis not present

## 2018-11-02 DIAGNOSIS — D509 Iron deficiency anemia, unspecified: Secondary | ICD-10-CM | POA: Diagnosis not present

## 2018-11-02 DIAGNOSIS — N319 Neuromuscular dysfunction of bladder, unspecified: Secondary | ICD-10-CM | POA: Diagnosis not present

## 2018-11-02 DIAGNOSIS — N400 Enlarged prostate without lower urinary tract symptoms: Secondary | ICD-10-CM | POA: Diagnosis not present

## 2018-11-06 DIAGNOSIS — S24104S Unspecified injury at T11-T12 level of thoracic spinal cord, sequela: Secondary | ICD-10-CM | POA: Diagnosis not present

## 2018-11-06 DIAGNOSIS — D509 Iron deficiency anemia, unspecified: Secondary | ICD-10-CM | POA: Diagnosis not present

## 2018-11-06 DIAGNOSIS — L89154 Pressure ulcer of sacral region, stage 4: Secondary | ICD-10-CM | POA: Diagnosis not present

## 2018-11-06 DIAGNOSIS — N319 Neuromuscular dysfunction of bladder, unspecified: Secondary | ICD-10-CM | POA: Diagnosis not present

## 2018-11-06 DIAGNOSIS — Z466 Encounter for fitting and adjustment of urinary device: Secondary | ICD-10-CM | POA: Diagnosis not present

## 2018-11-06 DIAGNOSIS — G8220 Paraplegia, unspecified: Secondary | ICD-10-CM | POA: Diagnosis not present

## 2018-11-06 DIAGNOSIS — N400 Enlarged prostate without lower urinary tract symptoms: Secondary | ICD-10-CM | POA: Diagnosis not present

## 2018-11-06 DIAGNOSIS — I10 Essential (primary) hypertension: Secondary | ICD-10-CM | POA: Diagnosis not present

## 2018-11-06 DIAGNOSIS — J309 Allergic rhinitis, unspecified: Secondary | ICD-10-CM | POA: Diagnosis not present

## 2018-11-09 DIAGNOSIS — I10 Essential (primary) hypertension: Secondary | ICD-10-CM | POA: Diagnosis not present

## 2018-11-09 DIAGNOSIS — G8220 Paraplegia, unspecified: Secondary | ICD-10-CM | POA: Diagnosis not present

## 2018-11-09 DIAGNOSIS — N400 Enlarged prostate without lower urinary tract symptoms: Secondary | ICD-10-CM | POA: Diagnosis not present

## 2018-11-09 DIAGNOSIS — D509 Iron deficiency anemia, unspecified: Secondary | ICD-10-CM | POA: Diagnosis not present

## 2018-11-09 DIAGNOSIS — L89154 Pressure ulcer of sacral region, stage 4: Secondary | ICD-10-CM | POA: Diagnosis not present

## 2018-11-09 DIAGNOSIS — S24104S Unspecified injury at T11-T12 level of thoracic spinal cord, sequela: Secondary | ICD-10-CM | POA: Diagnosis not present

## 2018-11-09 DIAGNOSIS — Z466 Encounter for fitting and adjustment of urinary device: Secondary | ICD-10-CM | POA: Diagnosis not present

## 2018-11-09 DIAGNOSIS — J309 Allergic rhinitis, unspecified: Secondary | ICD-10-CM | POA: Diagnosis not present

## 2018-11-09 DIAGNOSIS — N319 Neuromuscular dysfunction of bladder, unspecified: Secondary | ICD-10-CM | POA: Diagnosis not present

## 2018-11-13 DIAGNOSIS — I10 Essential (primary) hypertension: Secondary | ICD-10-CM | POA: Diagnosis not present

## 2018-11-13 DIAGNOSIS — S24104S Unspecified injury at T11-T12 level of thoracic spinal cord, sequela: Secondary | ICD-10-CM | POA: Diagnosis not present

## 2018-11-13 DIAGNOSIS — D509 Iron deficiency anemia, unspecified: Secondary | ICD-10-CM | POA: Diagnosis not present

## 2018-11-13 DIAGNOSIS — G8220 Paraplegia, unspecified: Secondary | ICD-10-CM | POA: Diagnosis not present

## 2018-11-13 DIAGNOSIS — Z466 Encounter for fitting and adjustment of urinary device: Secondary | ICD-10-CM | POA: Diagnosis not present

## 2018-11-13 DIAGNOSIS — L89154 Pressure ulcer of sacral region, stage 4: Secondary | ICD-10-CM | POA: Diagnosis not present

## 2018-11-13 DIAGNOSIS — J309 Allergic rhinitis, unspecified: Secondary | ICD-10-CM | POA: Diagnosis not present

## 2018-11-13 DIAGNOSIS — N400 Enlarged prostate without lower urinary tract symptoms: Secondary | ICD-10-CM | POA: Diagnosis not present

## 2018-11-13 DIAGNOSIS — N319 Neuromuscular dysfunction of bladder, unspecified: Secondary | ICD-10-CM | POA: Diagnosis not present

## 2018-11-15 DIAGNOSIS — Z466 Encounter for fitting and adjustment of urinary device: Secondary | ICD-10-CM | POA: Diagnosis not present

## 2018-11-15 DIAGNOSIS — I10 Essential (primary) hypertension: Secondary | ICD-10-CM | POA: Diagnosis not present

## 2018-11-15 DIAGNOSIS — S24104S Unspecified injury at T11-T12 level of thoracic spinal cord, sequela: Secondary | ICD-10-CM | POA: Diagnosis not present

## 2018-11-15 DIAGNOSIS — L89154 Pressure ulcer of sacral region, stage 4: Secondary | ICD-10-CM | POA: Diagnosis not present

## 2018-11-15 DIAGNOSIS — N319 Neuromuscular dysfunction of bladder, unspecified: Secondary | ICD-10-CM | POA: Diagnosis not present

## 2018-11-15 DIAGNOSIS — G8220 Paraplegia, unspecified: Secondary | ICD-10-CM | POA: Diagnosis not present

## 2018-11-15 DIAGNOSIS — J309 Allergic rhinitis, unspecified: Secondary | ICD-10-CM | POA: Diagnosis not present

## 2018-11-15 DIAGNOSIS — D509 Iron deficiency anemia, unspecified: Secondary | ICD-10-CM | POA: Diagnosis not present

## 2018-11-15 DIAGNOSIS — N400 Enlarged prostate without lower urinary tract symptoms: Secondary | ICD-10-CM | POA: Diagnosis not present

## 2018-11-20 DIAGNOSIS — G8220 Paraplegia, unspecified: Secondary | ICD-10-CM | POA: Diagnosis not present

## 2018-11-20 DIAGNOSIS — Z466 Encounter for fitting and adjustment of urinary device: Secondary | ICD-10-CM | POA: Diagnosis not present

## 2018-11-20 DIAGNOSIS — J309 Allergic rhinitis, unspecified: Secondary | ICD-10-CM | POA: Diagnosis not present

## 2018-11-20 DIAGNOSIS — L89154 Pressure ulcer of sacral region, stage 4: Secondary | ICD-10-CM | POA: Diagnosis not present

## 2018-11-20 DIAGNOSIS — N400 Enlarged prostate without lower urinary tract symptoms: Secondary | ICD-10-CM | POA: Diagnosis not present

## 2018-11-20 DIAGNOSIS — N319 Neuromuscular dysfunction of bladder, unspecified: Secondary | ICD-10-CM | POA: Diagnosis not present

## 2018-11-20 DIAGNOSIS — I10 Essential (primary) hypertension: Secondary | ICD-10-CM | POA: Diagnosis not present

## 2018-11-20 DIAGNOSIS — D509 Iron deficiency anemia, unspecified: Secondary | ICD-10-CM | POA: Diagnosis not present

## 2018-11-20 DIAGNOSIS — S24104S Unspecified injury at T11-T12 level of thoracic spinal cord, sequela: Secondary | ICD-10-CM | POA: Diagnosis not present

## 2018-11-21 DIAGNOSIS — E440 Moderate protein-calorie malnutrition: Secondary | ICD-10-CM | POA: Diagnosis not present

## 2018-11-21 DIAGNOSIS — M869 Osteomyelitis, unspecified: Secondary | ICD-10-CM | POA: Diagnosis not present

## 2018-11-21 DIAGNOSIS — G8220 Paraplegia, unspecified: Secondary | ICD-10-CM | POA: Diagnosis not present

## 2018-11-21 DIAGNOSIS — L89154 Pressure ulcer of sacral region, stage 4: Secondary | ICD-10-CM | POA: Diagnosis not present

## 2018-11-22 DIAGNOSIS — J309 Allergic rhinitis, unspecified: Secondary | ICD-10-CM | POA: Diagnosis not present

## 2018-11-22 DIAGNOSIS — N139 Obstructive and reflux uropathy, unspecified: Secondary | ICD-10-CM | POA: Diagnosis not present

## 2018-11-22 DIAGNOSIS — N400 Enlarged prostate without lower urinary tract symptoms: Secondary | ICD-10-CM | POA: Diagnosis not present

## 2018-11-22 DIAGNOSIS — G8220 Paraplegia, unspecified: Secondary | ICD-10-CM | POA: Diagnosis not present

## 2018-11-22 DIAGNOSIS — I10 Essential (primary) hypertension: Secondary | ICD-10-CM | POA: Diagnosis not present

## 2018-11-22 DIAGNOSIS — D509 Iron deficiency anemia, unspecified: Secondary | ICD-10-CM | POA: Diagnosis not present

## 2018-11-22 DIAGNOSIS — L89154 Pressure ulcer of sacral region, stage 4: Secondary | ICD-10-CM | POA: Diagnosis not present

## 2018-11-23 DIAGNOSIS — N400 Enlarged prostate without lower urinary tract symptoms: Secondary | ICD-10-CM | POA: Diagnosis not present

## 2018-11-23 DIAGNOSIS — J309 Allergic rhinitis, unspecified: Secondary | ICD-10-CM | POA: Diagnosis not present

## 2018-11-23 DIAGNOSIS — G8220 Paraplegia, unspecified: Secondary | ICD-10-CM | POA: Diagnosis not present

## 2018-11-23 DIAGNOSIS — I10 Essential (primary) hypertension: Secondary | ICD-10-CM | POA: Diagnosis not present

## 2018-11-23 DIAGNOSIS — S24104S Unspecified injury at T11-T12 level of thoracic spinal cord, sequela: Secondary | ICD-10-CM | POA: Diagnosis not present

## 2018-11-23 DIAGNOSIS — L89154 Pressure ulcer of sacral region, stage 4: Secondary | ICD-10-CM | POA: Diagnosis not present

## 2018-11-23 DIAGNOSIS — Z466 Encounter for fitting and adjustment of urinary device: Secondary | ICD-10-CM | POA: Diagnosis not present

## 2018-11-23 DIAGNOSIS — D509 Iron deficiency anemia, unspecified: Secondary | ICD-10-CM | POA: Diagnosis not present

## 2018-11-23 DIAGNOSIS — N319 Neuromuscular dysfunction of bladder, unspecified: Secondary | ICD-10-CM | POA: Diagnosis not present

## 2018-11-27 DIAGNOSIS — S24104S Unspecified injury at T11-T12 level of thoracic spinal cord, sequela: Secondary | ICD-10-CM | POA: Diagnosis not present

## 2018-11-27 DIAGNOSIS — G8220 Paraplegia, unspecified: Secondary | ICD-10-CM | POA: Diagnosis not present

## 2018-11-27 DIAGNOSIS — Z466 Encounter for fitting and adjustment of urinary device: Secondary | ICD-10-CM | POA: Diagnosis not present

## 2018-11-27 DIAGNOSIS — N400 Enlarged prostate without lower urinary tract symptoms: Secondary | ICD-10-CM | POA: Diagnosis not present

## 2018-11-27 DIAGNOSIS — L89154 Pressure ulcer of sacral region, stage 4: Secondary | ICD-10-CM | POA: Diagnosis not present

## 2018-11-27 DIAGNOSIS — D509 Iron deficiency anemia, unspecified: Secondary | ICD-10-CM | POA: Diagnosis not present

## 2018-11-27 DIAGNOSIS — I10 Essential (primary) hypertension: Secondary | ICD-10-CM | POA: Diagnosis not present

## 2018-11-27 DIAGNOSIS — N319 Neuromuscular dysfunction of bladder, unspecified: Secondary | ICD-10-CM | POA: Diagnosis not present

## 2018-11-27 DIAGNOSIS — J309 Allergic rhinitis, unspecified: Secondary | ICD-10-CM | POA: Diagnosis not present

## 2018-11-30 DIAGNOSIS — J309 Allergic rhinitis, unspecified: Secondary | ICD-10-CM | POA: Diagnosis not present

## 2018-11-30 DIAGNOSIS — S24104S Unspecified injury at T11-T12 level of thoracic spinal cord, sequela: Secondary | ICD-10-CM | POA: Diagnosis not present

## 2018-11-30 DIAGNOSIS — G8220 Paraplegia, unspecified: Secondary | ICD-10-CM | POA: Diagnosis not present

## 2018-11-30 DIAGNOSIS — I10 Essential (primary) hypertension: Secondary | ICD-10-CM | POA: Diagnosis not present

## 2018-11-30 DIAGNOSIS — N400 Enlarged prostate without lower urinary tract symptoms: Secondary | ICD-10-CM | POA: Diagnosis not present

## 2018-11-30 DIAGNOSIS — L89154 Pressure ulcer of sacral region, stage 4: Secondary | ICD-10-CM | POA: Diagnosis not present

## 2018-11-30 DIAGNOSIS — N319 Neuromuscular dysfunction of bladder, unspecified: Secondary | ICD-10-CM | POA: Diagnosis not present

## 2018-11-30 DIAGNOSIS — Z466 Encounter for fitting and adjustment of urinary device: Secondary | ICD-10-CM | POA: Diagnosis not present

## 2018-11-30 DIAGNOSIS — D509 Iron deficiency anemia, unspecified: Secondary | ICD-10-CM | POA: Diagnosis not present

## 2018-12-04 DIAGNOSIS — G8220 Paraplegia, unspecified: Secondary | ICD-10-CM | POA: Diagnosis not present

## 2018-12-04 DIAGNOSIS — L89154 Pressure ulcer of sacral region, stage 4: Secondary | ICD-10-CM | POA: Diagnosis not present

## 2018-12-04 DIAGNOSIS — J309 Allergic rhinitis, unspecified: Secondary | ICD-10-CM | POA: Diagnosis not present

## 2018-12-04 DIAGNOSIS — N400 Enlarged prostate without lower urinary tract symptoms: Secondary | ICD-10-CM | POA: Diagnosis not present

## 2018-12-04 DIAGNOSIS — N319 Neuromuscular dysfunction of bladder, unspecified: Secondary | ICD-10-CM | POA: Diagnosis not present

## 2018-12-04 DIAGNOSIS — I10 Essential (primary) hypertension: Secondary | ICD-10-CM | POA: Diagnosis not present

## 2018-12-04 DIAGNOSIS — S24104S Unspecified injury at T11-T12 level of thoracic spinal cord, sequela: Secondary | ICD-10-CM | POA: Diagnosis not present

## 2018-12-04 DIAGNOSIS — Z466 Encounter for fitting and adjustment of urinary device: Secondary | ICD-10-CM | POA: Diagnosis not present

## 2018-12-04 DIAGNOSIS — D509 Iron deficiency anemia, unspecified: Secondary | ICD-10-CM | POA: Diagnosis not present

## 2018-12-07 DIAGNOSIS — D509 Iron deficiency anemia, unspecified: Secondary | ICD-10-CM | POA: Diagnosis not present

## 2018-12-07 DIAGNOSIS — N400 Enlarged prostate without lower urinary tract symptoms: Secondary | ICD-10-CM | POA: Diagnosis not present

## 2018-12-07 DIAGNOSIS — N319 Neuromuscular dysfunction of bladder, unspecified: Secondary | ICD-10-CM | POA: Diagnosis not present

## 2018-12-07 DIAGNOSIS — Z466 Encounter for fitting and adjustment of urinary device: Secondary | ICD-10-CM | POA: Diagnosis not present

## 2018-12-07 DIAGNOSIS — J309 Allergic rhinitis, unspecified: Secondary | ICD-10-CM | POA: Diagnosis not present

## 2018-12-07 DIAGNOSIS — I10 Essential (primary) hypertension: Secondary | ICD-10-CM | POA: Diagnosis not present

## 2018-12-07 DIAGNOSIS — L89154 Pressure ulcer of sacral region, stage 4: Secondary | ICD-10-CM | POA: Diagnosis not present

## 2018-12-07 DIAGNOSIS — G8220 Paraplegia, unspecified: Secondary | ICD-10-CM | POA: Diagnosis not present

## 2018-12-07 DIAGNOSIS — S24104S Unspecified injury at T11-T12 level of thoracic spinal cord, sequela: Secondary | ICD-10-CM | POA: Diagnosis not present

## 2018-12-11 DIAGNOSIS — N319 Neuromuscular dysfunction of bladder, unspecified: Secondary | ICD-10-CM | POA: Diagnosis not present

## 2018-12-11 DIAGNOSIS — L89154 Pressure ulcer of sacral region, stage 4: Secondary | ICD-10-CM | POA: Diagnosis not present

## 2018-12-11 DIAGNOSIS — S24104S Unspecified injury at T11-T12 level of thoracic spinal cord, sequela: Secondary | ICD-10-CM | POA: Diagnosis not present

## 2018-12-11 DIAGNOSIS — J309 Allergic rhinitis, unspecified: Secondary | ICD-10-CM | POA: Diagnosis not present

## 2018-12-11 DIAGNOSIS — G8220 Paraplegia, unspecified: Secondary | ICD-10-CM | POA: Diagnosis not present

## 2018-12-11 DIAGNOSIS — N400 Enlarged prostate without lower urinary tract symptoms: Secondary | ICD-10-CM | POA: Diagnosis not present

## 2018-12-11 DIAGNOSIS — D509 Iron deficiency anemia, unspecified: Secondary | ICD-10-CM | POA: Diagnosis not present

## 2018-12-11 DIAGNOSIS — Z466 Encounter for fitting and adjustment of urinary device: Secondary | ICD-10-CM | POA: Diagnosis not present

## 2018-12-11 DIAGNOSIS — I10 Essential (primary) hypertension: Secondary | ICD-10-CM | POA: Diagnosis not present

## 2018-12-18 DIAGNOSIS — N319 Neuromuscular dysfunction of bladder, unspecified: Secondary | ICD-10-CM | POA: Diagnosis not present

## 2018-12-18 DIAGNOSIS — D509 Iron deficiency anemia, unspecified: Secondary | ICD-10-CM | POA: Diagnosis not present

## 2018-12-18 DIAGNOSIS — G8220 Paraplegia, unspecified: Secondary | ICD-10-CM | POA: Diagnosis not present

## 2018-12-18 DIAGNOSIS — N400 Enlarged prostate without lower urinary tract symptoms: Secondary | ICD-10-CM | POA: Diagnosis not present

## 2018-12-18 DIAGNOSIS — J309 Allergic rhinitis, unspecified: Secondary | ICD-10-CM | POA: Diagnosis not present

## 2018-12-18 DIAGNOSIS — I10 Essential (primary) hypertension: Secondary | ICD-10-CM | POA: Diagnosis not present

## 2018-12-18 DIAGNOSIS — S24104S Unspecified injury at T11-T12 level of thoracic spinal cord, sequela: Secondary | ICD-10-CM | POA: Diagnosis not present

## 2018-12-18 DIAGNOSIS — L89154 Pressure ulcer of sacral region, stage 4: Secondary | ICD-10-CM | POA: Diagnosis not present

## 2018-12-18 DIAGNOSIS — Z466 Encounter for fitting and adjustment of urinary device: Secondary | ICD-10-CM | POA: Diagnosis not present

## 2018-12-20 DIAGNOSIS — J309 Allergic rhinitis, unspecified: Secondary | ICD-10-CM | POA: Diagnosis not present

## 2018-12-20 DIAGNOSIS — S24104S Unspecified injury at T11-T12 level of thoracic spinal cord, sequela: Secondary | ICD-10-CM | POA: Diagnosis not present

## 2018-12-20 DIAGNOSIS — I10 Essential (primary) hypertension: Secondary | ICD-10-CM | POA: Diagnosis not present

## 2018-12-20 DIAGNOSIS — D509 Iron deficiency anemia, unspecified: Secondary | ICD-10-CM | POA: Diagnosis not present

## 2018-12-20 DIAGNOSIS — G8220 Paraplegia, unspecified: Secondary | ICD-10-CM | POA: Diagnosis not present

## 2018-12-20 DIAGNOSIS — Z466 Encounter for fitting and adjustment of urinary device: Secondary | ICD-10-CM | POA: Diagnosis not present

## 2018-12-20 DIAGNOSIS — L89154 Pressure ulcer of sacral region, stage 4: Secondary | ICD-10-CM | POA: Diagnosis not present

## 2018-12-20 DIAGNOSIS — N319 Neuromuscular dysfunction of bladder, unspecified: Secondary | ICD-10-CM | POA: Diagnosis not present

## 2018-12-20 DIAGNOSIS — N400 Enlarged prostate without lower urinary tract symptoms: Secondary | ICD-10-CM | POA: Diagnosis not present

## 2018-12-22 DIAGNOSIS — L89154 Pressure ulcer of sacral region, stage 4: Secondary | ICD-10-CM | POA: Diagnosis not present

## 2018-12-22 DIAGNOSIS — G8220 Paraplegia, unspecified: Secondary | ICD-10-CM | POA: Diagnosis not present

## 2018-12-25 DIAGNOSIS — L89154 Pressure ulcer of sacral region, stage 4: Secondary | ICD-10-CM | POA: Diagnosis not present

## 2018-12-25 DIAGNOSIS — G8220 Paraplegia, unspecified: Secondary | ICD-10-CM | POA: Diagnosis not present

## 2018-12-25 DIAGNOSIS — N400 Enlarged prostate without lower urinary tract symptoms: Secondary | ICD-10-CM | POA: Diagnosis not present

## 2018-12-25 DIAGNOSIS — N319 Neuromuscular dysfunction of bladder, unspecified: Secondary | ICD-10-CM | POA: Diagnosis not present

## 2018-12-25 DIAGNOSIS — I10 Essential (primary) hypertension: Secondary | ICD-10-CM | POA: Diagnosis not present

## 2018-12-25 DIAGNOSIS — D509 Iron deficiency anemia, unspecified: Secondary | ICD-10-CM | POA: Diagnosis not present

## 2018-12-25 DIAGNOSIS — J309 Allergic rhinitis, unspecified: Secondary | ICD-10-CM | POA: Diagnosis not present

## 2018-12-25 DIAGNOSIS — Z466 Encounter for fitting and adjustment of urinary device: Secondary | ICD-10-CM | POA: Diagnosis not present

## 2018-12-25 DIAGNOSIS — S24104S Unspecified injury at T11-T12 level of thoracic spinal cord, sequela: Secondary | ICD-10-CM | POA: Diagnosis not present

## 2018-12-28 DIAGNOSIS — J309 Allergic rhinitis, unspecified: Secondary | ICD-10-CM | POA: Diagnosis not present

## 2018-12-28 DIAGNOSIS — I10 Essential (primary) hypertension: Secondary | ICD-10-CM | POA: Diagnosis not present

## 2018-12-28 DIAGNOSIS — Z466 Encounter for fitting and adjustment of urinary device: Secondary | ICD-10-CM | POA: Diagnosis not present

## 2018-12-28 DIAGNOSIS — S24104S Unspecified injury at T11-T12 level of thoracic spinal cord, sequela: Secondary | ICD-10-CM | POA: Diagnosis not present

## 2018-12-28 DIAGNOSIS — D509 Iron deficiency anemia, unspecified: Secondary | ICD-10-CM | POA: Diagnosis not present

## 2018-12-28 DIAGNOSIS — N319 Neuromuscular dysfunction of bladder, unspecified: Secondary | ICD-10-CM | POA: Diagnosis not present

## 2018-12-28 DIAGNOSIS — N400 Enlarged prostate without lower urinary tract symptoms: Secondary | ICD-10-CM | POA: Diagnosis not present

## 2018-12-28 DIAGNOSIS — G8220 Paraplegia, unspecified: Secondary | ICD-10-CM | POA: Diagnosis not present

## 2018-12-28 DIAGNOSIS — L89154 Pressure ulcer of sacral region, stage 4: Secondary | ICD-10-CM | POA: Diagnosis not present

## 2019-01-01 DIAGNOSIS — N400 Enlarged prostate without lower urinary tract symptoms: Secondary | ICD-10-CM | POA: Diagnosis not present

## 2019-01-01 DIAGNOSIS — G8220 Paraplegia, unspecified: Secondary | ICD-10-CM | POA: Diagnosis not present

## 2019-01-01 DIAGNOSIS — J309 Allergic rhinitis, unspecified: Secondary | ICD-10-CM | POA: Diagnosis not present

## 2019-01-01 DIAGNOSIS — D509 Iron deficiency anemia, unspecified: Secondary | ICD-10-CM | POA: Diagnosis not present

## 2019-01-01 DIAGNOSIS — N319 Neuromuscular dysfunction of bladder, unspecified: Secondary | ICD-10-CM | POA: Diagnosis not present

## 2019-01-01 DIAGNOSIS — Z466 Encounter for fitting and adjustment of urinary device: Secondary | ICD-10-CM | POA: Diagnosis not present

## 2019-01-01 DIAGNOSIS — L89154 Pressure ulcer of sacral region, stage 4: Secondary | ICD-10-CM | POA: Diagnosis not present

## 2019-01-01 DIAGNOSIS — S24104S Unspecified injury at T11-T12 level of thoracic spinal cord, sequela: Secondary | ICD-10-CM | POA: Diagnosis not present

## 2019-01-01 DIAGNOSIS — I10 Essential (primary) hypertension: Secondary | ICD-10-CM | POA: Diagnosis not present

## 2019-01-03 DIAGNOSIS — L89154 Pressure ulcer of sacral region, stage 4: Secondary | ICD-10-CM | POA: Diagnosis not present

## 2019-01-03 DIAGNOSIS — G8220 Paraplegia, unspecified: Secondary | ICD-10-CM | POA: Diagnosis not present

## 2019-01-03 DIAGNOSIS — E440 Moderate protein-calorie malnutrition: Secondary | ICD-10-CM | POA: Diagnosis not present

## 2019-01-04 DIAGNOSIS — S24104S Unspecified injury at T11-T12 level of thoracic spinal cord, sequela: Secondary | ICD-10-CM | POA: Diagnosis not present

## 2019-01-04 DIAGNOSIS — Z466 Encounter for fitting and adjustment of urinary device: Secondary | ICD-10-CM | POA: Diagnosis not present

## 2019-01-04 DIAGNOSIS — I10 Essential (primary) hypertension: Secondary | ICD-10-CM | POA: Diagnosis not present

## 2019-01-04 DIAGNOSIS — D509 Iron deficiency anemia, unspecified: Secondary | ICD-10-CM | POA: Diagnosis not present

## 2019-01-04 DIAGNOSIS — N319 Neuromuscular dysfunction of bladder, unspecified: Secondary | ICD-10-CM | POA: Diagnosis not present

## 2019-01-04 DIAGNOSIS — J309 Allergic rhinitis, unspecified: Secondary | ICD-10-CM | POA: Diagnosis not present

## 2019-01-04 DIAGNOSIS — L89154 Pressure ulcer of sacral region, stage 4: Secondary | ICD-10-CM | POA: Diagnosis not present

## 2019-01-04 DIAGNOSIS — N400 Enlarged prostate without lower urinary tract symptoms: Secondary | ICD-10-CM | POA: Diagnosis not present

## 2019-01-04 DIAGNOSIS — G8220 Paraplegia, unspecified: Secondary | ICD-10-CM | POA: Diagnosis not present

## 2019-01-08 DIAGNOSIS — Z466 Encounter for fitting and adjustment of urinary device: Secondary | ICD-10-CM | POA: Diagnosis not present

## 2019-01-08 DIAGNOSIS — L89154 Pressure ulcer of sacral region, stage 4: Secondary | ICD-10-CM | POA: Diagnosis not present

## 2019-01-08 DIAGNOSIS — G8220 Paraplegia, unspecified: Secondary | ICD-10-CM | POA: Diagnosis not present

## 2019-01-08 DIAGNOSIS — N400 Enlarged prostate without lower urinary tract symptoms: Secondary | ICD-10-CM | POA: Diagnosis not present

## 2019-01-08 DIAGNOSIS — J309 Allergic rhinitis, unspecified: Secondary | ICD-10-CM | POA: Diagnosis not present

## 2019-01-08 DIAGNOSIS — I10 Essential (primary) hypertension: Secondary | ICD-10-CM | POA: Diagnosis not present

## 2019-01-08 DIAGNOSIS — S24104S Unspecified injury at T11-T12 level of thoracic spinal cord, sequela: Secondary | ICD-10-CM | POA: Diagnosis not present

## 2019-01-08 DIAGNOSIS — N319 Neuromuscular dysfunction of bladder, unspecified: Secondary | ICD-10-CM | POA: Diagnosis not present

## 2019-01-08 DIAGNOSIS — D509 Iron deficiency anemia, unspecified: Secondary | ICD-10-CM | POA: Diagnosis not present

## 2019-01-11 DIAGNOSIS — D509 Iron deficiency anemia, unspecified: Secondary | ICD-10-CM | POA: Diagnosis not present

## 2019-01-11 DIAGNOSIS — L89154 Pressure ulcer of sacral region, stage 4: Secondary | ICD-10-CM | POA: Diagnosis not present

## 2019-01-11 DIAGNOSIS — G8220 Paraplegia, unspecified: Secondary | ICD-10-CM | POA: Diagnosis not present

## 2019-01-11 DIAGNOSIS — N319 Neuromuscular dysfunction of bladder, unspecified: Secondary | ICD-10-CM | POA: Diagnosis not present

## 2019-01-11 DIAGNOSIS — J309 Allergic rhinitis, unspecified: Secondary | ICD-10-CM | POA: Diagnosis not present

## 2019-01-11 DIAGNOSIS — I10 Essential (primary) hypertension: Secondary | ICD-10-CM | POA: Diagnosis not present

## 2019-01-11 DIAGNOSIS — N400 Enlarged prostate without lower urinary tract symptoms: Secondary | ICD-10-CM | POA: Diagnosis not present

## 2019-01-11 DIAGNOSIS — S24104S Unspecified injury at T11-T12 level of thoracic spinal cord, sequela: Secondary | ICD-10-CM | POA: Diagnosis not present

## 2019-01-11 DIAGNOSIS — Z466 Encounter for fitting and adjustment of urinary device: Secondary | ICD-10-CM | POA: Diagnosis not present

## 2019-01-15 ENCOUNTER — Other Ambulatory Visit: Payer: Self-pay

## 2019-01-22 DIAGNOSIS — G8220 Paraplegia, unspecified: Secondary | ICD-10-CM | POA: Diagnosis not present

## 2019-01-22 DIAGNOSIS — N319 Neuromuscular dysfunction of bladder, unspecified: Secondary | ICD-10-CM | POA: Diagnosis not present

## 2019-01-22 DIAGNOSIS — S24104S Unspecified injury at T11-T12 level of thoracic spinal cord, sequela: Secondary | ICD-10-CM | POA: Diagnosis not present

## 2019-01-22 DIAGNOSIS — Z466 Encounter for fitting and adjustment of urinary device: Secondary | ICD-10-CM | POA: Diagnosis not present

## 2019-01-22 DIAGNOSIS — J309 Allergic rhinitis, unspecified: Secondary | ICD-10-CM | POA: Diagnosis not present

## 2019-01-22 DIAGNOSIS — N400 Enlarged prostate without lower urinary tract symptoms: Secondary | ICD-10-CM | POA: Diagnosis not present

## 2019-01-22 DIAGNOSIS — I10 Essential (primary) hypertension: Secondary | ICD-10-CM | POA: Diagnosis not present

## 2019-01-22 DIAGNOSIS — L89154 Pressure ulcer of sacral region, stage 4: Secondary | ICD-10-CM | POA: Diagnosis not present

## 2019-01-22 DIAGNOSIS — D509 Iron deficiency anemia, unspecified: Secondary | ICD-10-CM | POA: Diagnosis not present

## 2019-01-24 DIAGNOSIS — L89154 Pressure ulcer of sacral region, stage 4: Secondary | ICD-10-CM | POA: Diagnosis not present

## 2019-01-24 DIAGNOSIS — N400 Enlarged prostate without lower urinary tract symptoms: Secondary | ICD-10-CM | POA: Diagnosis not present

## 2019-01-24 DIAGNOSIS — D509 Iron deficiency anemia, unspecified: Secondary | ICD-10-CM | POA: Diagnosis not present

## 2019-01-24 DIAGNOSIS — N319 Neuromuscular dysfunction of bladder, unspecified: Secondary | ICD-10-CM | POA: Diagnosis not present

## 2019-01-24 DIAGNOSIS — I10 Essential (primary) hypertension: Secondary | ICD-10-CM | POA: Diagnosis not present

## 2019-01-24 DIAGNOSIS — S24104S Unspecified injury at T11-T12 level of thoracic spinal cord, sequela: Secondary | ICD-10-CM | POA: Diagnosis not present

## 2019-01-24 DIAGNOSIS — G8220 Paraplegia, unspecified: Secondary | ICD-10-CM | POA: Diagnosis not present

## 2019-01-24 DIAGNOSIS — J309 Allergic rhinitis, unspecified: Secondary | ICD-10-CM | POA: Diagnosis not present

## 2019-01-25 DIAGNOSIS — D509 Iron deficiency anemia, unspecified: Secondary | ICD-10-CM | POA: Diagnosis not present

## 2019-01-25 DIAGNOSIS — J309 Allergic rhinitis, unspecified: Secondary | ICD-10-CM | POA: Diagnosis not present

## 2019-01-25 DIAGNOSIS — N319 Neuromuscular dysfunction of bladder, unspecified: Secondary | ICD-10-CM | POA: Diagnosis not present

## 2019-01-25 DIAGNOSIS — N400 Enlarged prostate without lower urinary tract symptoms: Secondary | ICD-10-CM | POA: Diagnosis not present

## 2019-01-25 DIAGNOSIS — S24104S Unspecified injury at T11-T12 level of thoracic spinal cord, sequela: Secondary | ICD-10-CM | POA: Diagnosis not present

## 2019-01-25 DIAGNOSIS — G8220 Paraplegia, unspecified: Secondary | ICD-10-CM | POA: Diagnosis not present

## 2019-01-25 DIAGNOSIS — I10 Essential (primary) hypertension: Secondary | ICD-10-CM | POA: Diagnosis not present

## 2019-01-25 DIAGNOSIS — Z466 Encounter for fitting and adjustment of urinary device: Secondary | ICD-10-CM | POA: Diagnosis not present

## 2019-01-25 DIAGNOSIS — L89154 Pressure ulcer of sacral region, stage 4: Secondary | ICD-10-CM | POA: Diagnosis not present

## 2019-01-29 DIAGNOSIS — N319 Neuromuscular dysfunction of bladder, unspecified: Secondary | ICD-10-CM | POA: Diagnosis not present

## 2019-01-29 DIAGNOSIS — N400 Enlarged prostate without lower urinary tract symptoms: Secondary | ICD-10-CM | POA: Diagnosis not present

## 2019-01-29 DIAGNOSIS — J309 Allergic rhinitis, unspecified: Secondary | ICD-10-CM | POA: Diagnosis not present

## 2019-01-29 DIAGNOSIS — L89154 Pressure ulcer of sacral region, stage 4: Secondary | ICD-10-CM | POA: Diagnosis not present

## 2019-01-29 DIAGNOSIS — S24104S Unspecified injury at T11-T12 level of thoracic spinal cord, sequela: Secondary | ICD-10-CM | POA: Diagnosis not present

## 2019-01-29 DIAGNOSIS — Z466 Encounter for fitting and adjustment of urinary device: Secondary | ICD-10-CM | POA: Diagnosis not present

## 2019-01-29 DIAGNOSIS — I10 Essential (primary) hypertension: Secondary | ICD-10-CM | POA: Diagnosis not present

## 2019-01-29 DIAGNOSIS — D509 Iron deficiency anemia, unspecified: Secondary | ICD-10-CM | POA: Diagnosis not present

## 2019-01-29 DIAGNOSIS — G8220 Paraplegia, unspecified: Secondary | ICD-10-CM | POA: Diagnosis not present

## 2019-01-31 DIAGNOSIS — N319 Neuromuscular dysfunction of bladder, unspecified: Secondary | ICD-10-CM | POA: Diagnosis not present

## 2019-02-01 DIAGNOSIS — N400 Enlarged prostate without lower urinary tract symptoms: Secondary | ICD-10-CM | POA: Diagnosis not present

## 2019-02-01 DIAGNOSIS — I10 Essential (primary) hypertension: Secondary | ICD-10-CM | POA: Diagnosis not present

## 2019-02-01 DIAGNOSIS — Z466 Encounter for fitting and adjustment of urinary device: Secondary | ICD-10-CM | POA: Diagnosis not present

## 2019-02-01 DIAGNOSIS — G8220 Paraplegia, unspecified: Secondary | ICD-10-CM | POA: Diagnosis not present

## 2019-02-01 DIAGNOSIS — S24104S Unspecified injury at T11-T12 level of thoracic spinal cord, sequela: Secondary | ICD-10-CM | POA: Diagnosis not present

## 2019-02-01 DIAGNOSIS — D509 Iron deficiency anemia, unspecified: Secondary | ICD-10-CM | POA: Diagnosis not present

## 2019-02-01 DIAGNOSIS — L89154 Pressure ulcer of sacral region, stage 4: Secondary | ICD-10-CM | POA: Diagnosis not present

## 2019-02-01 DIAGNOSIS — N319 Neuromuscular dysfunction of bladder, unspecified: Secondary | ICD-10-CM | POA: Diagnosis not present

## 2019-02-01 DIAGNOSIS — J309 Allergic rhinitis, unspecified: Secondary | ICD-10-CM | POA: Diagnosis not present

## 2019-02-05 DIAGNOSIS — J309 Allergic rhinitis, unspecified: Secondary | ICD-10-CM | POA: Diagnosis not present

## 2019-02-05 DIAGNOSIS — D509 Iron deficiency anemia, unspecified: Secondary | ICD-10-CM | POA: Diagnosis not present

## 2019-02-05 DIAGNOSIS — G8220 Paraplegia, unspecified: Secondary | ICD-10-CM | POA: Diagnosis not present

## 2019-02-05 DIAGNOSIS — N400 Enlarged prostate without lower urinary tract symptoms: Secondary | ICD-10-CM | POA: Diagnosis not present

## 2019-02-05 DIAGNOSIS — N319 Neuromuscular dysfunction of bladder, unspecified: Secondary | ICD-10-CM | POA: Diagnosis not present

## 2019-02-05 DIAGNOSIS — Z466 Encounter for fitting and adjustment of urinary device: Secondary | ICD-10-CM | POA: Diagnosis not present

## 2019-02-05 DIAGNOSIS — S24104S Unspecified injury at T11-T12 level of thoracic spinal cord, sequela: Secondary | ICD-10-CM | POA: Diagnosis not present

## 2019-02-05 DIAGNOSIS — I10 Essential (primary) hypertension: Secondary | ICD-10-CM | POA: Diagnosis not present

## 2019-02-05 DIAGNOSIS — L89154 Pressure ulcer of sacral region, stage 4: Secondary | ICD-10-CM | POA: Diagnosis not present

## 2019-02-07 DIAGNOSIS — S24103S Unspecified injury at T7-T10 level of thoracic spinal cord, sequela: Secondary | ICD-10-CM | POA: Diagnosis not present

## 2019-02-07 DIAGNOSIS — G8220 Paraplegia, unspecified: Secondary | ICD-10-CM | POA: Diagnosis not present

## 2019-02-07 DIAGNOSIS — L89154 Pressure ulcer of sacral region, stage 4: Secondary | ICD-10-CM | POA: Diagnosis not present

## 2019-02-08 DIAGNOSIS — N400 Enlarged prostate without lower urinary tract symptoms: Secondary | ICD-10-CM | POA: Diagnosis not present

## 2019-02-08 DIAGNOSIS — S24104S Unspecified injury at T11-T12 level of thoracic spinal cord, sequela: Secondary | ICD-10-CM | POA: Diagnosis not present

## 2019-02-08 DIAGNOSIS — N319 Neuromuscular dysfunction of bladder, unspecified: Secondary | ICD-10-CM | POA: Diagnosis not present

## 2019-02-08 DIAGNOSIS — G8220 Paraplegia, unspecified: Secondary | ICD-10-CM | POA: Diagnosis not present

## 2019-02-08 DIAGNOSIS — I10 Essential (primary) hypertension: Secondary | ICD-10-CM | POA: Diagnosis not present

## 2019-02-08 DIAGNOSIS — L89154 Pressure ulcer of sacral region, stage 4: Secondary | ICD-10-CM | POA: Diagnosis not present

## 2019-02-08 DIAGNOSIS — J309 Allergic rhinitis, unspecified: Secondary | ICD-10-CM | POA: Diagnosis not present

## 2019-02-08 DIAGNOSIS — Z466 Encounter for fitting and adjustment of urinary device: Secondary | ICD-10-CM | POA: Diagnosis not present

## 2019-02-08 DIAGNOSIS — D509 Iron deficiency anemia, unspecified: Secondary | ICD-10-CM | POA: Diagnosis not present

## 2019-02-12 DIAGNOSIS — N400 Enlarged prostate without lower urinary tract symptoms: Secondary | ICD-10-CM | POA: Diagnosis not present

## 2019-02-12 DIAGNOSIS — J309 Allergic rhinitis, unspecified: Secondary | ICD-10-CM | POA: Diagnosis not present

## 2019-02-12 DIAGNOSIS — I10 Essential (primary) hypertension: Secondary | ICD-10-CM | POA: Diagnosis not present

## 2019-02-12 DIAGNOSIS — S24104S Unspecified injury at T11-T12 level of thoracic spinal cord, sequela: Secondary | ICD-10-CM | POA: Diagnosis not present

## 2019-02-12 DIAGNOSIS — G8220 Paraplegia, unspecified: Secondary | ICD-10-CM | POA: Diagnosis not present

## 2019-02-12 DIAGNOSIS — L89154 Pressure ulcer of sacral region, stage 4: Secondary | ICD-10-CM | POA: Diagnosis not present

## 2019-02-12 DIAGNOSIS — N319 Neuromuscular dysfunction of bladder, unspecified: Secondary | ICD-10-CM | POA: Diagnosis not present

## 2019-02-12 DIAGNOSIS — Z466 Encounter for fitting and adjustment of urinary device: Secondary | ICD-10-CM | POA: Diagnosis not present

## 2019-02-12 DIAGNOSIS — D509 Iron deficiency anemia, unspecified: Secondary | ICD-10-CM | POA: Diagnosis not present

## 2019-02-15 DIAGNOSIS — N319 Neuromuscular dysfunction of bladder, unspecified: Secondary | ICD-10-CM | POA: Diagnosis not present

## 2019-02-15 DIAGNOSIS — I10 Essential (primary) hypertension: Secondary | ICD-10-CM | POA: Diagnosis not present

## 2019-02-15 DIAGNOSIS — G8220 Paraplegia, unspecified: Secondary | ICD-10-CM | POA: Diagnosis not present

## 2019-02-15 DIAGNOSIS — S24104S Unspecified injury at T11-T12 level of thoracic spinal cord, sequela: Secondary | ICD-10-CM | POA: Diagnosis not present

## 2019-02-15 DIAGNOSIS — Z466 Encounter for fitting and adjustment of urinary device: Secondary | ICD-10-CM | POA: Diagnosis not present

## 2019-02-15 DIAGNOSIS — L89154 Pressure ulcer of sacral region, stage 4: Secondary | ICD-10-CM | POA: Diagnosis not present

## 2019-02-15 DIAGNOSIS — J309 Allergic rhinitis, unspecified: Secondary | ICD-10-CM | POA: Diagnosis not present

## 2019-02-15 DIAGNOSIS — N400 Enlarged prostate without lower urinary tract symptoms: Secondary | ICD-10-CM | POA: Diagnosis not present

## 2019-02-15 DIAGNOSIS — D509 Iron deficiency anemia, unspecified: Secondary | ICD-10-CM | POA: Diagnosis not present

## 2019-02-19 DIAGNOSIS — L89154 Pressure ulcer of sacral region, stage 4: Secondary | ICD-10-CM | POA: Diagnosis not present

## 2019-02-19 DIAGNOSIS — S24104S Unspecified injury at T11-T12 level of thoracic spinal cord, sequela: Secondary | ICD-10-CM | POA: Diagnosis not present

## 2019-02-19 DIAGNOSIS — G8220 Paraplegia, unspecified: Secondary | ICD-10-CM | POA: Diagnosis not present

## 2019-02-19 DIAGNOSIS — I10 Essential (primary) hypertension: Secondary | ICD-10-CM | POA: Diagnosis not present

## 2019-02-19 DIAGNOSIS — Z466 Encounter for fitting and adjustment of urinary device: Secondary | ICD-10-CM | POA: Diagnosis not present

## 2019-02-19 DIAGNOSIS — N319 Neuromuscular dysfunction of bladder, unspecified: Secondary | ICD-10-CM | POA: Diagnosis not present

## 2019-02-19 DIAGNOSIS — J309 Allergic rhinitis, unspecified: Secondary | ICD-10-CM | POA: Diagnosis not present

## 2019-02-19 DIAGNOSIS — D509 Iron deficiency anemia, unspecified: Secondary | ICD-10-CM | POA: Diagnosis not present

## 2019-02-19 DIAGNOSIS — N400 Enlarged prostate without lower urinary tract symptoms: Secondary | ICD-10-CM | POA: Diagnosis not present

## 2019-02-22 DIAGNOSIS — I10 Essential (primary) hypertension: Secondary | ICD-10-CM | POA: Diagnosis not present

## 2019-02-22 DIAGNOSIS — Z466 Encounter for fitting and adjustment of urinary device: Secondary | ICD-10-CM | POA: Diagnosis not present

## 2019-02-22 DIAGNOSIS — J309 Allergic rhinitis, unspecified: Secondary | ICD-10-CM | POA: Diagnosis not present

## 2019-02-22 DIAGNOSIS — G8220 Paraplegia, unspecified: Secondary | ICD-10-CM | POA: Diagnosis not present

## 2019-02-22 DIAGNOSIS — D509 Iron deficiency anemia, unspecified: Secondary | ICD-10-CM | POA: Diagnosis not present

## 2019-02-22 DIAGNOSIS — L89154 Pressure ulcer of sacral region, stage 4: Secondary | ICD-10-CM | POA: Diagnosis not present

## 2019-02-22 DIAGNOSIS — N319 Neuromuscular dysfunction of bladder, unspecified: Secondary | ICD-10-CM | POA: Diagnosis not present

## 2019-02-22 DIAGNOSIS — S24104S Unspecified injury at T11-T12 level of thoracic spinal cord, sequela: Secondary | ICD-10-CM | POA: Diagnosis not present

## 2019-02-22 DIAGNOSIS — N400 Enlarged prostate without lower urinary tract symptoms: Secondary | ICD-10-CM | POA: Diagnosis not present

## 2019-02-26 DIAGNOSIS — S24104S Unspecified injury at T11-T12 level of thoracic spinal cord, sequela: Secondary | ICD-10-CM | POA: Diagnosis not present

## 2019-02-26 DIAGNOSIS — G8220 Paraplegia, unspecified: Secondary | ICD-10-CM | POA: Diagnosis not present

## 2019-02-26 DIAGNOSIS — J309 Allergic rhinitis, unspecified: Secondary | ICD-10-CM | POA: Diagnosis not present

## 2019-02-26 DIAGNOSIS — I10 Essential (primary) hypertension: Secondary | ICD-10-CM | POA: Diagnosis not present

## 2019-02-26 DIAGNOSIS — Z466 Encounter for fitting and adjustment of urinary device: Secondary | ICD-10-CM | POA: Diagnosis not present

## 2019-02-26 DIAGNOSIS — D509 Iron deficiency anemia, unspecified: Secondary | ICD-10-CM | POA: Diagnosis not present

## 2019-02-26 DIAGNOSIS — N400 Enlarged prostate without lower urinary tract symptoms: Secondary | ICD-10-CM | POA: Diagnosis not present

## 2019-02-26 DIAGNOSIS — N319 Neuromuscular dysfunction of bladder, unspecified: Secondary | ICD-10-CM | POA: Diagnosis not present

## 2019-02-26 DIAGNOSIS — L89154 Pressure ulcer of sacral region, stage 4: Secondary | ICD-10-CM | POA: Diagnosis not present

## 2019-03-01 DIAGNOSIS — J309 Allergic rhinitis, unspecified: Secondary | ICD-10-CM | POA: Diagnosis not present

## 2019-03-01 DIAGNOSIS — N319 Neuromuscular dysfunction of bladder, unspecified: Secondary | ICD-10-CM | POA: Diagnosis not present

## 2019-03-01 DIAGNOSIS — I10 Essential (primary) hypertension: Secondary | ICD-10-CM | POA: Diagnosis not present

## 2019-03-01 DIAGNOSIS — Z466 Encounter for fitting and adjustment of urinary device: Secondary | ICD-10-CM | POA: Diagnosis not present

## 2019-03-01 DIAGNOSIS — G8220 Paraplegia, unspecified: Secondary | ICD-10-CM | POA: Diagnosis not present

## 2019-03-01 DIAGNOSIS — D509 Iron deficiency anemia, unspecified: Secondary | ICD-10-CM | POA: Diagnosis not present

## 2019-03-01 DIAGNOSIS — S24104S Unspecified injury at T11-T12 level of thoracic spinal cord, sequela: Secondary | ICD-10-CM | POA: Diagnosis not present

## 2019-03-01 DIAGNOSIS — L89154 Pressure ulcer of sacral region, stage 4: Secondary | ICD-10-CM | POA: Diagnosis not present

## 2019-03-01 DIAGNOSIS — N400 Enlarged prostate without lower urinary tract symptoms: Secondary | ICD-10-CM | POA: Diagnosis not present

## 2019-03-05 DIAGNOSIS — N400 Enlarged prostate without lower urinary tract symptoms: Secondary | ICD-10-CM | POA: Diagnosis not present

## 2019-03-05 DIAGNOSIS — L89154 Pressure ulcer of sacral region, stage 4: Secondary | ICD-10-CM | POA: Diagnosis not present

## 2019-03-05 DIAGNOSIS — I10 Essential (primary) hypertension: Secondary | ICD-10-CM | POA: Diagnosis not present

## 2019-03-05 DIAGNOSIS — D509 Iron deficiency anemia, unspecified: Secondary | ICD-10-CM | POA: Diagnosis not present

## 2019-03-05 DIAGNOSIS — J309 Allergic rhinitis, unspecified: Secondary | ICD-10-CM | POA: Diagnosis not present

## 2019-03-05 DIAGNOSIS — S24104S Unspecified injury at T11-T12 level of thoracic spinal cord, sequela: Secondary | ICD-10-CM | POA: Diagnosis not present

## 2019-03-05 DIAGNOSIS — N319 Neuromuscular dysfunction of bladder, unspecified: Secondary | ICD-10-CM | POA: Diagnosis not present

## 2019-03-05 DIAGNOSIS — G8220 Paraplegia, unspecified: Secondary | ICD-10-CM | POA: Diagnosis not present

## 2019-03-05 DIAGNOSIS — Z466 Encounter for fitting and adjustment of urinary device: Secondary | ICD-10-CM | POA: Diagnosis not present

## 2019-03-08 DIAGNOSIS — L89154 Pressure ulcer of sacral region, stage 4: Secondary | ICD-10-CM | POA: Diagnosis not present

## 2019-03-08 DIAGNOSIS — N400 Enlarged prostate without lower urinary tract symptoms: Secondary | ICD-10-CM | POA: Diagnosis not present

## 2019-03-08 DIAGNOSIS — I10 Essential (primary) hypertension: Secondary | ICD-10-CM | POA: Diagnosis not present

## 2019-03-08 DIAGNOSIS — S24104S Unspecified injury at T11-T12 level of thoracic spinal cord, sequela: Secondary | ICD-10-CM | POA: Diagnosis not present

## 2019-03-08 DIAGNOSIS — G8220 Paraplegia, unspecified: Secondary | ICD-10-CM | POA: Diagnosis not present

## 2019-03-08 DIAGNOSIS — D509 Iron deficiency anemia, unspecified: Secondary | ICD-10-CM | POA: Diagnosis not present

## 2019-03-08 DIAGNOSIS — N319 Neuromuscular dysfunction of bladder, unspecified: Secondary | ICD-10-CM | POA: Diagnosis not present

## 2019-03-08 DIAGNOSIS — J309 Allergic rhinitis, unspecified: Secondary | ICD-10-CM | POA: Diagnosis not present

## 2019-03-08 DIAGNOSIS — Z466 Encounter for fitting and adjustment of urinary device: Secondary | ICD-10-CM | POA: Diagnosis not present

## 2019-03-12 DIAGNOSIS — G8220 Paraplegia, unspecified: Secondary | ICD-10-CM | POA: Diagnosis not present

## 2019-03-12 DIAGNOSIS — Z466 Encounter for fitting and adjustment of urinary device: Secondary | ICD-10-CM | POA: Diagnosis not present

## 2019-03-12 DIAGNOSIS — I10 Essential (primary) hypertension: Secondary | ICD-10-CM | POA: Diagnosis not present

## 2019-03-12 DIAGNOSIS — N319 Neuromuscular dysfunction of bladder, unspecified: Secondary | ICD-10-CM | POA: Diagnosis not present

## 2019-03-12 DIAGNOSIS — N400 Enlarged prostate without lower urinary tract symptoms: Secondary | ICD-10-CM | POA: Diagnosis not present

## 2019-03-12 DIAGNOSIS — S24104S Unspecified injury at T11-T12 level of thoracic spinal cord, sequela: Secondary | ICD-10-CM | POA: Diagnosis not present

## 2019-03-12 DIAGNOSIS — L89154 Pressure ulcer of sacral region, stage 4: Secondary | ICD-10-CM | POA: Diagnosis not present

## 2019-03-12 DIAGNOSIS — J309 Allergic rhinitis, unspecified: Secondary | ICD-10-CM | POA: Diagnosis not present

## 2019-03-12 DIAGNOSIS — D509 Iron deficiency anemia, unspecified: Secondary | ICD-10-CM | POA: Diagnosis not present

## 2019-03-15 DIAGNOSIS — J309 Allergic rhinitis, unspecified: Secondary | ICD-10-CM | POA: Diagnosis not present

## 2019-03-15 DIAGNOSIS — L89154 Pressure ulcer of sacral region, stage 4: Secondary | ICD-10-CM | POA: Diagnosis not present

## 2019-03-15 DIAGNOSIS — I10 Essential (primary) hypertension: Secondary | ICD-10-CM | POA: Diagnosis not present

## 2019-03-15 DIAGNOSIS — G8220 Paraplegia, unspecified: Secondary | ICD-10-CM | POA: Diagnosis not present

## 2019-03-15 DIAGNOSIS — N400 Enlarged prostate without lower urinary tract symptoms: Secondary | ICD-10-CM | POA: Diagnosis not present

## 2019-03-15 DIAGNOSIS — D509 Iron deficiency anemia, unspecified: Secondary | ICD-10-CM | POA: Diagnosis not present

## 2019-03-15 DIAGNOSIS — N319 Neuromuscular dysfunction of bladder, unspecified: Secondary | ICD-10-CM | POA: Diagnosis not present

## 2019-03-15 DIAGNOSIS — S24104S Unspecified injury at T11-T12 level of thoracic spinal cord, sequela: Secondary | ICD-10-CM | POA: Diagnosis not present

## 2019-03-15 DIAGNOSIS — Z466 Encounter for fitting and adjustment of urinary device: Secondary | ICD-10-CM | POA: Diagnosis not present

## 2019-03-18 DIAGNOSIS — I10 Essential (primary) hypertension: Secondary | ICD-10-CM | POA: Diagnosis not present

## 2019-03-18 DIAGNOSIS — D509 Iron deficiency anemia, unspecified: Secondary | ICD-10-CM | POA: Diagnosis not present

## 2019-03-19 DIAGNOSIS — S24104S Unspecified injury at T11-T12 level of thoracic spinal cord, sequela: Secondary | ICD-10-CM | POA: Diagnosis not present

## 2019-03-19 DIAGNOSIS — N401 Enlarged prostate with lower urinary tract symptoms: Secondary | ICD-10-CM | POA: Diagnosis not present

## 2019-03-19 DIAGNOSIS — G8220 Paraplegia, unspecified: Secondary | ICD-10-CM | POA: Diagnosis not present

## 2019-03-19 DIAGNOSIS — L89154 Pressure ulcer of sacral region, stage 4: Secondary | ICD-10-CM | POA: Diagnosis not present

## 2019-03-19 DIAGNOSIS — I10 Essential (primary) hypertension: Secondary | ICD-10-CM | POA: Diagnosis not present

## 2019-03-19 DIAGNOSIS — D509 Iron deficiency anemia, unspecified: Secondary | ICD-10-CM | POA: Diagnosis not present

## 2019-03-19 DIAGNOSIS — N39498 Other specified urinary incontinence: Secondary | ICD-10-CM | POA: Diagnosis not present

## 2019-03-19 DIAGNOSIS — J309 Allergic rhinitis, unspecified: Secondary | ICD-10-CM | POA: Diagnosis not present

## 2019-03-19 DIAGNOSIS — N319 Neuromuscular dysfunction of bladder, unspecified: Secondary | ICD-10-CM | POA: Diagnosis not present

## 2019-03-22 DIAGNOSIS — D509 Iron deficiency anemia, unspecified: Secondary | ICD-10-CM | POA: Diagnosis not present

## 2019-03-22 DIAGNOSIS — J309 Allergic rhinitis, unspecified: Secondary | ICD-10-CM | POA: Diagnosis not present

## 2019-03-22 DIAGNOSIS — N319 Neuromuscular dysfunction of bladder, unspecified: Secondary | ICD-10-CM | POA: Diagnosis not present

## 2019-03-22 DIAGNOSIS — N39498 Other specified urinary incontinence: Secondary | ICD-10-CM | POA: Diagnosis not present

## 2019-03-22 DIAGNOSIS — L89154 Pressure ulcer of sacral region, stage 4: Secondary | ICD-10-CM | POA: Diagnosis not present

## 2019-03-22 DIAGNOSIS — G8220 Paraplegia, unspecified: Secondary | ICD-10-CM | POA: Diagnosis not present

## 2019-03-22 DIAGNOSIS — S24104S Unspecified injury at T11-T12 level of thoracic spinal cord, sequela: Secondary | ICD-10-CM | POA: Diagnosis not present

## 2019-03-22 DIAGNOSIS — N401 Enlarged prostate with lower urinary tract symptoms: Secondary | ICD-10-CM | POA: Diagnosis not present

## 2019-03-22 DIAGNOSIS — I10 Essential (primary) hypertension: Secondary | ICD-10-CM | POA: Diagnosis not present

## 2019-03-24 DIAGNOSIS — G8220 Paraplegia, unspecified: Secondary | ICD-10-CM | POA: Diagnosis not present

## 2019-03-24 DIAGNOSIS — L89154 Pressure ulcer of sacral region, stage 4: Secondary | ICD-10-CM | POA: Diagnosis not present

## 2019-03-26 DIAGNOSIS — S24104S Unspecified injury at T11-T12 level of thoracic spinal cord, sequela: Secondary | ICD-10-CM | POA: Diagnosis not present

## 2019-03-26 DIAGNOSIS — N319 Neuromuscular dysfunction of bladder, unspecified: Secondary | ICD-10-CM | POA: Diagnosis not present

## 2019-03-26 DIAGNOSIS — D509 Iron deficiency anemia, unspecified: Secondary | ICD-10-CM | POA: Diagnosis not present

## 2019-03-26 DIAGNOSIS — L89154 Pressure ulcer of sacral region, stage 4: Secondary | ICD-10-CM | POA: Diagnosis not present

## 2019-03-26 DIAGNOSIS — N39498 Other specified urinary incontinence: Secondary | ICD-10-CM | POA: Diagnosis not present

## 2019-03-26 DIAGNOSIS — I10 Essential (primary) hypertension: Secondary | ICD-10-CM | POA: Diagnosis not present

## 2019-03-26 DIAGNOSIS — N401 Enlarged prostate with lower urinary tract symptoms: Secondary | ICD-10-CM | POA: Diagnosis not present

## 2019-03-26 DIAGNOSIS — J309 Allergic rhinitis, unspecified: Secondary | ICD-10-CM | POA: Diagnosis not present

## 2019-03-26 DIAGNOSIS — G8220 Paraplegia, unspecified: Secondary | ICD-10-CM | POA: Diagnosis not present

## 2019-03-28 DIAGNOSIS — D589 Hereditary hemolytic anemia, unspecified: Secondary | ICD-10-CM | POA: Diagnosis not present

## 2019-03-28 DIAGNOSIS — Z87891 Personal history of nicotine dependence: Secondary | ICD-10-CM | POA: Diagnosis not present

## 2019-03-28 DIAGNOSIS — D509 Iron deficiency anemia, unspecified: Secondary | ICD-10-CM | POA: Diagnosis not present

## 2019-03-28 DIAGNOSIS — I10 Essential (primary) hypertension: Secondary | ICD-10-CM | POA: Diagnosis not present

## 2019-03-29 DIAGNOSIS — L89154 Pressure ulcer of sacral region, stage 4: Secondary | ICD-10-CM | POA: Diagnosis not present

## 2019-03-29 DIAGNOSIS — I10 Essential (primary) hypertension: Secondary | ICD-10-CM | POA: Diagnosis not present

## 2019-03-29 DIAGNOSIS — N39498 Other specified urinary incontinence: Secondary | ICD-10-CM | POA: Diagnosis not present

## 2019-03-29 DIAGNOSIS — N401 Enlarged prostate with lower urinary tract symptoms: Secondary | ICD-10-CM | POA: Diagnosis not present

## 2019-03-29 DIAGNOSIS — D509 Iron deficiency anemia, unspecified: Secondary | ICD-10-CM | POA: Diagnosis not present

## 2019-03-29 DIAGNOSIS — J309 Allergic rhinitis, unspecified: Secondary | ICD-10-CM | POA: Diagnosis not present

## 2019-03-29 DIAGNOSIS — G8220 Paraplegia, unspecified: Secondary | ICD-10-CM | POA: Diagnosis not present

## 2019-03-29 DIAGNOSIS — S24104S Unspecified injury at T11-T12 level of thoracic spinal cord, sequela: Secondary | ICD-10-CM | POA: Diagnosis not present

## 2019-03-29 DIAGNOSIS — N319 Neuromuscular dysfunction of bladder, unspecified: Secondary | ICD-10-CM | POA: Diagnosis not present

## 2019-04-02 DIAGNOSIS — G8220 Paraplegia, unspecified: Secondary | ICD-10-CM | POA: Diagnosis not present

## 2019-04-02 DIAGNOSIS — N401 Enlarged prostate with lower urinary tract symptoms: Secondary | ICD-10-CM | POA: Diagnosis not present

## 2019-04-02 DIAGNOSIS — L89154 Pressure ulcer of sacral region, stage 4: Secondary | ICD-10-CM | POA: Diagnosis not present

## 2019-04-02 DIAGNOSIS — N39498 Other specified urinary incontinence: Secondary | ICD-10-CM | POA: Diagnosis not present

## 2019-04-02 DIAGNOSIS — N319 Neuromuscular dysfunction of bladder, unspecified: Secondary | ICD-10-CM | POA: Diagnosis not present

## 2019-04-02 DIAGNOSIS — D509 Iron deficiency anemia, unspecified: Secondary | ICD-10-CM | POA: Diagnosis not present

## 2019-04-02 DIAGNOSIS — S24104S Unspecified injury at T11-T12 level of thoracic spinal cord, sequela: Secondary | ICD-10-CM | POA: Diagnosis not present

## 2019-04-02 DIAGNOSIS — J309 Allergic rhinitis, unspecified: Secondary | ICD-10-CM | POA: Diagnosis not present

## 2019-04-02 DIAGNOSIS — I10 Essential (primary) hypertension: Secondary | ICD-10-CM | POA: Diagnosis not present

## 2019-04-05 DIAGNOSIS — S24104S Unspecified injury at T11-T12 level of thoracic spinal cord, sequela: Secondary | ICD-10-CM | POA: Diagnosis not present

## 2019-04-05 DIAGNOSIS — N319 Neuromuscular dysfunction of bladder, unspecified: Secondary | ICD-10-CM | POA: Diagnosis not present

## 2019-04-05 DIAGNOSIS — J309 Allergic rhinitis, unspecified: Secondary | ICD-10-CM | POA: Diagnosis not present

## 2019-04-05 DIAGNOSIS — L89154 Pressure ulcer of sacral region, stage 4: Secondary | ICD-10-CM | POA: Diagnosis not present

## 2019-04-05 DIAGNOSIS — I10 Essential (primary) hypertension: Secondary | ICD-10-CM | POA: Diagnosis not present

## 2019-04-05 DIAGNOSIS — N39498 Other specified urinary incontinence: Secondary | ICD-10-CM | POA: Diagnosis not present

## 2019-04-05 DIAGNOSIS — D509 Iron deficiency anemia, unspecified: Secondary | ICD-10-CM | POA: Diagnosis not present

## 2019-04-05 DIAGNOSIS — G8220 Paraplegia, unspecified: Secondary | ICD-10-CM | POA: Diagnosis not present

## 2019-04-05 DIAGNOSIS — N401 Enlarged prostate with lower urinary tract symptoms: Secondary | ICD-10-CM | POA: Diagnosis not present

## 2019-04-09 DIAGNOSIS — N401 Enlarged prostate with lower urinary tract symptoms: Secondary | ICD-10-CM | POA: Diagnosis not present

## 2019-04-09 DIAGNOSIS — N319 Neuromuscular dysfunction of bladder, unspecified: Secondary | ICD-10-CM | POA: Diagnosis not present

## 2019-04-09 DIAGNOSIS — N39498 Other specified urinary incontinence: Secondary | ICD-10-CM | POA: Diagnosis not present

## 2019-04-09 DIAGNOSIS — L89154 Pressure ulcer of sacral region, stage 4: Secondary | ICD-10-CM | POA: Diagnosis not present

## 2019-04-09 DIAGNOSIS — D509 Iron deficiency anemia, unspecified: Secondary | ICD-10-CM | POA: Diagnosis not present

## 2019-04-09 DIAGNOSIS — S24104S Unspecified injury at T11-T12 level of thoracic spinal cord, sequela: Secondary | ICD-10-CM | POA: Diagnosis not present

## 2019-04-09 DIAGNOSIS — J309 Allergic rhinitis, unspecified: Secondary | ICD-10-CM | POA: Diagnosis not present

## 2019-04-09 DIAGNOSIS — G8220 Paraplegia, unspecified: Secondary | ICD-10-CM | POA: Diagnosis not present

## 2019-04-09 DIAGNOSIS — I10 Essential (primary) hypertension: Secondary | ICD-10-CM | POA: Diagnosis not present

## 2019-04-10 DIAGNOSIS — G8220 Paraplegia, unspecified: Secondary | ICD-10-CM | POA: Diagnosis not present

## 2019-04-10 DIAGNOSIS — L89154 Pressure ulcer of sacral region, stage 4: Secondary | ICD-10-CM | POA: Diagnosis not present

## 2019-04-10 DIAGNOSIS — E440 Moderate protein-calorie malnutrition: Secondary | ICD-10-CM | POA: Diagnosis not present

## 2019-04-10 DIAGNOSIS — T148XXD Other injury of unspecified body region, subsequent encounter: Secondary | ICD-10-CM | POA: Diagnosis not present

## 2019-04-10 DIAGNOSIS — M869 Osteomyelitis, unspecified: Secondary | ICD-10-CM | POA: Diagnosis not present

## 2019-04-12 DIAGNOSIS — L89154 Pressure ulcer of sacral region, stage 4: Secondary | ICD-10-CM | POA: Diagnosis not present

## 2019-04-12 DIAGNOSIS — D509 Iron deficiency anemia, unspecified: Secondary | ICD-10-CM | POA: Diagnosis not present

## 2019-04-12 DIAGNOSIS — N401 Enlarged prostate with lower urinary tract symptoms: Secondary | ICD-10-CM | POA: Diagnosis not present

## 2019-04-12 DIAGNOSIS — G8220 Paraplegia, unspecified: Secondary | ICD-10-CM | POA: Diagnosis not present

## 2019-04-12 DIAGNOSIS — I10 Essential (primary) hypertension: Secondary | ICD-10-CM | POA: Diagnosis not present

## 2019-04-12 DIAGNOSIS — S24104S Unspecified injury at T11-T12 level of thoracic spinal cord, sequela: Secondary | ICD-10-CM | POA: Diagnosis not present

## 2019-04-12 DIAGNOSIS — N319 Neuromuscular dysfunction of bladder, unspecified: Secondary | ICD-10-CM | POA: Diagnosis not present

## 2019-04-12 DIAGNOSIS — J309 Allergic rhinitis, unspecified: Secondary | ICD-10-CM | POA: Diagnosis not present

## 2019-04-12 DIAGNOSIS — N39498 Other specified urinary incontinence: Secondary | ICD-10-CM | POA: Diagnosis not present

## 2019-04-16 DIAGNOSIS — S24104S Unspecified injury at T11-T12 level of thoracic spinal cord, sequela: Secondary | ICD-10-CM | POA: Diagnosis not present

## 2019-04-16 DIAGNOSIS — I10 Essential (primary) hypertension: Secondary | ICD-10-CM | POA: Diagnosis not present

## 2019-04-16 DIAGNOSIS — G8220 Paraplegia, unspecified: Secondary | ICD-10-CM | POA: Diagnosis not present

## 2019-04-16 DIAGNOSIS — N39498 Other specified urinary incontinence: Secondary | ICD-10-CM | POA: Diagnosis not present

## 2019-04-16 DIAGNOSIS — N401 Enlarged prostate with lower urinary tract symptoms: Secondary | ICD-10-CM | POA: Diagnosis not present

## 2019-04-16 DIAGNOSIS — D509 Iron deficiency anemia, unspecified: Secondary | ICD-10-CM | POA: Diagnosis not present

## 2019-04-16 DIAGNOSIS — N319 Neuromuscular dysfunction of bladder, unspecified: Secondary | ICD-10-CM | POA: Diagnosis not present

## 2019-04-16 DIAGNOSIS — L89154 Pressure ulcer of sacral region, stage 4: Secondary | ICD-10-CM | POA: Diagnosis not present

## 2019-04-16 DIAGNOSIS — J309 Allergic rhinitis, unspecified: Secondary | ICD-10-CM | POA: Diagnosis not present

## 2019-04-19 DIAGNOSIS — N39498 Other specified urinary incontinence: Secondary | ICD-10-CM | POA: Diagnosis not present

## 2019-04-19 DIAGNOSIS — N401 Enlarged prostate with lower urinary tract symptoms: Secondary | ICD-10-CM | POA: Diagnosis not present

## 2019-04-19 DIAGNOSIS — N319 Neuromuscular dysfunction of bladder, unspecified: Secondary | ICD-10-CM | POA: Diagnosis not present

## 2019-04-19 DIAGNOSIS — J309 Allergic rhinitis, unspecified: Secondary | ICD-10-CM | POA: Diagnosis not present

## 2019-04-19 DIAGNOSIS — D509 Iron deficiency anemia, unspecified: Secondary | ICD-10-CM | POA: Diagnosis not present

## 2019-04-19 DIAGNOSIS — L89154 Pressure ulcer of sacral region, stage 4: Secondary | ICD-10-CM | POA: Diagnosis not present

## 2019-04-19 DIAGNOSIS — G8220 Paraplegia, unspecified: Secondary | ICD-10-CM | POA: Diagnosis not present

## 2019-04-19 DIAGNOSIS — S24104S Unspecified injury at T11-T12 level of thoracic spinal cord, sequela: Secondary | ICD-10-CM | POA: Diagnosis not present

## 2019-04-19 DIAGNOSIS — I10 Essential (primary) hypertension: Secondary | ICD-10-CM | POA: Diagnosis not present

## 2019-04-23 DIAGNOSIS — J309 Allergic rhinitis, unspecified: Secondary | ICD-10-CM | POA: Diagnosis not present

## 2019-04-23 DIAGNOSIS — N401 Enlarged prostate with lower urinary tract symptoms: Secondary | ICD-10-CM | POA: Diagnosis not present

## 2019-04-23 DIAGNOSIS — L89154 Pressure ulcer of sacral region, stage 4: Secondary | ICD-10-CM | POA: Diagnosis not present

## 2019-04-23 DIAGNOSIS — S24104S Unspecified injury at T11-T12 level of thoracic spinal cord, sequela: Secondary | ICD-10-CM | POA: Diagnosis not present

## 2019-04-23 DIAGNOSIS — N39498 Other specified urinary incontinence: Secondary | ICD-10-CM | POA: Diagnosis not present

## 2019-04-23 DIAGNOSIS — D509 Iron deficiency anemia, unspecified: Secondary | ICD-10-CM | POA: Diagnosis not present

## 2019-04-23 DIAGNOSIS — G8220 Paraplegia, unspecified: Secondary | ICD-10-CM | POA: Diagnosis not present

## 2019-04-23 DIAGNOSIS — N319 Neuromuscular dysfunction of bladder, unspecified: Secondary | ICD-10-CM | POA: Diagnosis not present

## 2019-04-23 DIAGNOSIS — I10 Essential (primary) hypertension: Secondary | ICD-10-CM | POA: Diagnosis not present

## 2019-04-26 DIAGNOSIS — S24104S Unspecified injury at T11-T12 level of thoracic spinal cord, sequela: Secondary | ICD-10-CM | POA: Diagnosis not present

## 2019-04-26 DIAGNOSIS — N319 Neuromuscular dysfunction of bladder, unspecified: Secondary | ICD-10-CM | POA: Diagnosis not present

## 2019-04-26 DIAGNOSIS — J309 Allergic rhinitis, unspecified: Secondary | ICD-10-CM | POA: Diagnosis not present

## 2019-04-26 DIAGNOSIS — I10 Essential (primary) hypertension: Secondary | ICD-10-CM | POA: Diagnosis not present

## 2019-04-26 DIAGNOSIS — L89154 Pressure ulcer of sacral region, stage 4: Secondary | ICD-10-CM | POA: Diagnosis not present

## 2019-04-26 DIAGNOSIS — D509 Iron deficiency anemia, unspecified: Secondary | ICD-10-CM | POA: Diagnosis not present

## 2019-04-26 DIAGNOSIS — N39498 Other specified urinary incontinence: Secondary | ICD-10-CM | POA: Diagnosis not present

## 2019-04-26 DIAGNOSIS — G8220 Paraplegia, unspecified: Secondary | ICD-10-CM | POA: Diagnosis not present

## 2019-04-26 DIAGNOSIS — N401 Enlarged prostate with lower urinary tract symptoms: Secondary | ICD-10-CM | POA: Diagnosis not present

## 2019-04-30 DIAGNOSIS — G8220 Paraplegia, unspecified: Secondary | ICD-10-CM | POA: Diagnosis not present

## 2019-04-30 DIAGNOSIS — L89154 Pressure ulcer of sacral region, stage 4: Secondary | ICD-10-CM | POA: Diagnosis not present

## 2019-04-30 DIAGNOSIS — D509 Iron deficiency anemia, unspecified: Secondary | ICD-10-CM | POA: Diagnosis not present

## 2019-04-30 DIAGNOSIS — N39498 Other specified urinary incontinence: Secondary | ICD-10-CM | POA: Diagnosis not present

## 2019-04-30 DIAGNOSIS — N319 Neuromuscular dysfunction of bladder, unspecified: Secondary | ICD-10-CM | POA: Diagnosis not present

## 2019-04-30 DIAGNOSIS — J309 Allergic rhinitis, unspecified: Secondary | ICD-10-CM | POA: Diagnosis not present

## 2019-04-30 DIAGNOSIS — N401 Enlarged prostate with lower urinary tract symptoms: Secondary | ICD-10-CM | POA: Diagnosis not present

## 2019-04-30 DIAGNOSIS — I10 Essential (primary) hypertension: Secondary | ICD-10-CM | POA: Diagnosis not present

## 2019-04-30 DIAGNOSIS — S24104S Unspecified injury at T11-T12 level of thoracic spinal cord, sequela: Secondary | ICD-10-CM | POA: Diagnosis not present

## 2019-05-03 DIAGNOSIS — J309 Allergic rhinitis, unspecified: Secondary | ICD-10-CM | POA: Diagnosis not present

## 2019-05-03 DIAGNOSIS — N319 Neuromuscular dysfunction of bladder, unspecified: Secondary | ICD-10-CM | POA: Diagnosis not present

## 2019-05-03 DIAGNOSIS — N39498 Other specified urinary incontinence: Secondary | ICD-10-CM | POA: Diagnosis not present

## 2019-05-03 DIAGNOSIS — D509 Iron deficiency anemia, unspecified: Secondary | ICD-10-CM | POA: Diagnosis not present

## 2019-05-03 DIAGNOSIS — N401 Enlarged prostate with lower urinary tract symptoms: Secondary | ICD-10-CM | POA: Diagnosis not present

## 2019-05-03 DIAGNOSIS — I10 Essential (primary) hypertension: Secondary | ICD-10-CM | POA: Diagnosis not present

## 2019-05-03 DIAGNOSIS — G8220 Paraplegia, unspecified: Secondary | ICD-10-CM | POA: Diagnosis not present

## 2019-05-03 DIAGNOSIS — L89154 Pressure ulcer of sacral region, stage 4: Secondary | ICD-10-CM | POA: Diagnosis not present

## 2019-05-03 DIAGNOSIS — M869 Osteomyelitis, unspecified: Secondary | ICD-10-CM | POA: Diagnosis not present

## 2019-05-03 DIAGNOSIS — S24104S Unspecified injury at T11-T12 level of thoracic spinal cord, sequela: Secondary | ICD-10-CM | POA: Diagnosis not present

## 2019-05-07 DIAGNOSIS — N319 Neuromuscular dysfunction of bladder, unspecified: Secondary | ICD-10-CM | POA: Diagnosis not present

## 2019-05-08 DIAGNOSIS — I10 Essential (primary) hypertension: Secondary | ICD-10-CM | POA: Diagnosis not present

## 2019-05-08 DIAGNOSIS — N401 Enlarged prostate with lower urinary tract symptoms: Secondary | ICD-10-CM | POA: Diagnosis not present

## 2019-05-08 DIAGNOSIS — J309 Allergic rhinitis, unspecified: Secondary | ICD-10-CM | POA: Diagnosis not present

## 2019-05-08 DIAGNOSIS — D509 Iron deficiency anemia, unspecified: Secondary | ICD-10-CM | POA: Diagnosis not present

## 2019-05-08 DIAGNOSIS — N39498 Other specified urinary incontinence: Secondary | ICD-10-CM | POA: Diagnosis not present

## 2019-05-08 DIAGNOSIS — N319 Neuromuscular dysfunction of bladder, unspecified: Secondary | ICD-10-CM | POA: Diagnosis not present

## 2019-05-08 DIAGNOSIS — G8220 Paraplegia, unspecified: Secondary | ICD-10-CM | POA: Diagnosis not present

## 2019-05-08 DIAGNOSIS — S24104S Unspecified injury at T11-T12 level of thoracic spinal cord, sequela: Secondary | ICD-10-CM | POA: Diagnosis not present

## 2019-05-08 DIAGNOSIS — L89154 Pressure ulcer of sacral region, stage 4: Secondary | ICD-10-CM | POA: Diagnosis not present

## 2019-05-10 DIAGNOSIS — L89154 Pressure ulcer of sacral region, stage 4: Secondary | ICD-10-CM | POA: Diagnosis not present

## 2019-05-10 DIAGNOSIS — I10 Essential (primary) hypertension: Secondary | ICD-10-CM | POA: Diagnosis not present

## 2019-05-10 DIAGNOSIS — N39498 Other specified urinary incontinence: Secondary | ICD-10-CM | POA: Diagnosis not present

## 2019-05-10 DIAGNOSIS — S24104S Unspecified injury at T11-T12 level of thoracic spinal cord, sequela: Secondary | ICD-10-CM | POA: Diagnosis not present

## 2019-05-10 DIAGNOSIS — G8220 Paraplegia, unspecified: Secondary | ICD-10-CM | POA: Diagnosis not present

## 2019-05-10 DIAGNOSIS — N401 Enlarged prostate with lower urinary tract symptoms: Secondary | ICD-10-CM | POA: Diagnosis not present

## 2019-05-10 DIAGNOSIS — J309 Allergic rhinitis, unspecified: Secondary | ICD-10-CM | POA: Diagnosis not present

## 2019-05-10 DIAGNOSIS — D509 Iron deficiency anemia, unspecified: Secondary | ICD-10-CM | POA: Diagnosis not present

## 2019-05-10 DIAGNOSIS — N319 Neuromuscular dysfunction of bladder, unspecified: Secondary | ICD-10-CM | POA: Diagnosis not present

## 2019-05-13 DIAGNOSIS — N319 Neuromuscular dysfunction of bladder, unspecified: Secondary | ICD-10-CM | POA: Diagnosis not present

## 2019-05-13 DIAGNOSIS — I10 Essential (primary) hypertension: Secondary | ICD-10-CM | POA: Diagnosis not present

## 2019-05-13 DIAGNOSIS — L89154 Pressure ulcer of sacral region, stage 4: Secondary | ICD-10-CM | POA: Diagnosis not present

## 2019-05-13 DIAGNOSIS — G8220 Paraplegia, unspecified: Secondary | ICD-10-CM | POA: Diagnosis not present

## 2019-05-13 DIAGNOSIS — J309 Allergic rhinitis, unspecified: Secondary | ICD-10-CM | POA: Diagnosis not present

## 2019-05-13 DIAGNOSIS — S24104S Unspecified injury at T11-T12 level of thoracic spinal cord, sequela: Secondary | ICD-10-CM | POA: Diagnosis not present

## 2019-05-13 DIAGNOSIS — N39498 Other specified urinary incontinence: Secondary | ICD-10-CM | POA: Diagnosis not present

## 2019-05-13 DIAGNOSIS — N401 Enlarged prostate with lower urinary tract symptoms: Secondary | ICD-10-CM | POA: Diagnosis not present

## 2019-05-13 DIAGNOSIS — D509 Iron deficiency anemia, unspecified: Secondary | ICD-10-CM | POA: Diagnosis not present

## 2019-05-15 DIAGNOSIS — J309 Allergic rhinitis, unspecified: Secondary | ICD-10-CM | POA: Diagnosis not present

## 2019-05-15 DIAGNOSIS — I10 Essential (primary) hypertension: Secondary | ICD-10-CM | POA: Diagnosis not present

## 2019-05-15 DIAGNOSIS — G8220 Paraplegia, unspecified: Secondary | ICD-10-CM | POA: Diagnosis not present

## 2019-05-15 DIAGNOSIS — D509 Iron deficiency anemia, unspecified: Secondary | ICD-10-CM | POA: Diagnosis not present

## 2019-05-15 DIAGNOSIS — S24104S Unspecified injury at T11-T12 level of thoracic spinal cord, sequela: Secondary | ICD-10-CM | POA: Diagnosis not present

## 2019-05-15 DIAGNOSIS — N319 Neuromuscular dysfunction of bladder, unspecified: Secondary | ICD-10-CM | POA: Diagnosis not present

## 2019-05-15 DIAGNOSIS — N401 Enlarged prostate with lower urinary tract symptoms: Secondary | ICD-10-CM | POA: Diagnosis not present

## 2019-05-15 DIAGNOSIS — N39498 Other specified urinary incontinence: Secondary | ICD-10-CM | POA: Diagnosis not present

## 2019-05-15 DIAGNOSIS — L89154 Pressure ulcer of sacral region, stage 4: Secondary | ICD-10-CM | POA: Diagnosis not present

## 2019-05-17 DIAGNOSIS — L8915 Pressure ulcer of sacral region: Secondary | ICD-10-CM | POA: Diagnosis not present

## 2019-05-17 DIAGNOSIS — Z933 Colostomy status: Secondary | ICD-10-CM | POA: Diagnosis not present

## 2019-05-17 DIAGNOSIS — L89154 Pressure ulcer of sacral region, stage 4: Secondary | ICD-10-CM | POA: Diagnosis not present

## 2019-05-17 DIAGNOSIS — D509 Iron deficiency anemia, unspecified: Secondary | ICD-10-CM | POA: Diagnosis not present

## 2019-05-17 DIAGNOSIS — N319 Neuromuscular dysfunction of bladder, unspecified: Secondary | ICD-10-CM | POA: Diagnosis not present

## 2019-05-17 DIAGNOSIS — G8220 Paraplegia, unspecified: Secondary | ICD-10-CM | POA: Diagnosis not present

## 2019-05-17 DIAGNOSIS — J309 Allergic rhinitis, unspecified: Secondary | ICD-10-CM | POA: Diagnosis not present

## 2019-05-17 DIAGNOSIS — S24104S Unspecified injury at T11-T12 level of thoracic spinal cord, sequela: Secondary | ICD-10-CM | POA: Diagnosis not present

## 2019-05-17 DIAGNOSIS — I10 Essential (primary) hypertension: Secondary | ICD-10-CM | POA: Diagnosis not present

## 2019-05-17 DIAGNOSIS — N400 Enlarged prostate without lower urinary tract symptoms: Secondary | ICD-10-CM | POA: Diagnosis not present

## 2019-05-20 DIAGNOSIS — I10 Essential (primary) hypertension: Secondary | ICD-10-CM | POA: Diagnosis not present

## 2019-05-20 DIAGNOSIS — Z933 Colostomy status: Secondary | ICD-10-CM | POA: Diagnosis not present

## 2019-05-20 DIAGNOSIS — L89154 Pressure ulcer of sacral region, stage 4: Secondary | ICD-10-CM | POA: Diagnosis not present

## 2019-05-20 DIAGNOSIS — D509 Iron deficiency anemia, unspecified: Secondary | ICD-10-CM | POA: Diagnosis not present

## 2019-05-20 DIAGNOSIS — N400 Enlarged prostate without lower urinary tract symptoms: Secondary | ICD-10-CM | POA: Diagnosis not present

## 2019-05-20 DIAGNOSIS — S24104S Unspecified injury at T11-T12 level of thoracic spinal cord, sequela: Secondary | ICD-10-CM | POA: Diagnosis not present

## 2019-05-20 DIAGNOSIS — J309 Allergic rhinitis, unspecified: Secondary | ICD-10-CM | POA: Diagnosis not present

## 2019-05-20 DIAGNOSIS — N319 Neuromuscular dysfunction of bladder, unspecified: Secondary | ICD-10-CM | POA: Diagnosis not present

## 2019-05-20 DIAGNOSIS — G8220 Paraplegia, unspecified: Secondary | ICD-10-CM | POA: Diagnosis not present

## 2019-05-21 DIAGNOSIS — L89154 Pressure ulcer of sacral region, stage 4: Secondary | ICD-10-CM | POA: Diagnosis not present

## 2019-05-21 DIAGNOSIS — N400 Enlarged prostate without lower urinary tract symptoms: Secondary | ICD-10-CM | POA: Diagnosis not present

## 2019-05-21 DIAGNOSIS — I10 Essential (primary) hypertension: Secondary | ICD-10-CM | POA: Diagnosis not present

## 2019-05-21 DIAGNOSIS — G8220 Paraplegia, unspecified: Secondary | ICD-10-CM | POA: Diagnosis not present

## 2019-05-21 DIAGNOSIS — N319 Neuromuscular dysfunction of bladder, unspecified: Secondary | ICD-10-CM | POA: Diagnosis not present

## 2019-05-21 DIAGNOSIS — D509 Iron deficiency anemia, unspecified: Secondary | ICD-10-CM | POA: Diagnosis not present

## 2019-05-21 DIAGNOSIS — J309 Allergic rhinitis, unspecified: Secondary | ICD-10-CM | POA: Diagnosis not present

## 2019-05-22 DIAGNOSIS — L89154 Pressure ulcer of sacral region, stage 4: Secondary | ICD-10-CM | POA: Diagnosis not present

## 2019-05-22 DIAGNOSIS — J309 Allergic rhinitis, unspecified: Secondary | ICD-10-CM | POA: Diagnosis not present

## 2019-05-22 DIAGNOSIS — D509 Iron deficiency anemia, unspecified: Secondary | ICD-10-CM | POA: Diagnosis not present

## 2019-05-22 DIAGNOSIS — G8220 Paraplegia, unspecified: Secondary | ICD-10-CM | POA: Diagnosis not present

## 2019-05-22 DIAGNOSIS — N400 Enlarged prostate without lower urinary tract symptoms: Secondary | ICD-10-CM | POA: Diagnosis not present

## 2019-05-22 DIAGNOSIS — N319 Neuromuscular dysfunction of bladder, unspecified: Secondary | ICD-10-CM | POA: Diagnosis not present

## 2019-05-22 DIAGNOSIS — Z933 Colostomy status: Secondary | ICD-10-CM | POA: Diagnosis not present

## 2019-05-22 DIAGNOSIS — S24104S Unspecified injury at T11-T12 level of thoracic spinal cord, sequela: Secondary | ICD-10-CM | POA: Diagnosis not present

## 2019-05-22 DIAGNOSIS — I10 Essential (primary) hypertension: Secondary | ICD-10-CM | POA: Diagnosis not present

## 2019-05-24 DIAGNOSIS — Z933 Colostomy status: Secondary | ICD-10-CM | POA: Diagnosis not present

## 2019-05-24 DIAGNOSIS — I10 Essential (primary) hypertension: Secondary | ICD-10-CM | POA: Diagnosis not present

## 2019-05-24 DIAGNOSIS — N400 Enlarged prostate without lower urinary tract symptoms: Secondary | ICD-10-CM | POA: Diagnosis not present

## 2019-05-24 DIAGNOSIS — D509 Iron deficiency anemia, unspecified: Secondary | ICD-10-CM | POA: Diagnosis not present

## 2019-05-24 DIAGNOSIS — J309 Allergic rhinitis, unspecified: Secondary | ICD-10-CM | POA: Diagnosis not present

## 2019-05-24 DIAGNOSIS — N319 Neuromuscular dysfunction of bladder, unspecified: Secondary | ICD-10-CM | POA: Diagnosis not present

## 2019-05-24 DIAGNOSIS — S24104S Unspecified injury at T11-T12 level of thoracic spinal cord, sequela: Secondary | ICD-10-CM | POA: Diagnosis not present

## 2019-05-24 DIAGNOSIS — L89154 Pressure ulcer of sacral region, stage 4: Secondary | ICD-10-CM | POA: Diagnosis not present

## 2019-05-24 DIAGNOSIS — G8220 Paraplegia, unspecified: Secondary | ICD-10-CM | POA: Diagnosis not present

## 2019-05-27 DIAGNOSIS — G8220 Paraplegia, unspecified: Secondary | ICD-10-CM | POA: Diagnosis not present

## 2019-05-27 DIAGNOSIS — N319 Neuromuscular dysfunction of bladder, unspecified: Secondary | ICD-10-CM | POA: Diagnosis not present

## 2019-05-27 DIAGNOSIS — S24104S Unspecified injury at T11-T12 level of thoracic spinal cord, sequela: Secondary | ICD-10-CM | POA: Diagnosis not present

## 2019-05-27 DIAGNOSIS — Z933 Colostomy status: Secondary | ICD-10-CM | POA: Diagnosis not present

## 2019-05-27 DIAGNOSIS — D509 Iron deficiency anemia, unspecified: Secondary | ICD-10-CM | POA: Diagnosis not present

## 2019-05-27 DIAGNOSIS — I10 Essential (primary) hypertension: Secondary | ICD-10-CM | POA: Diagnosis not present

## 2019-05-27 DIAGNOSIS — N400 Enlarged prostate without lower urinary tract symptoms: Secondary | ICD-10-CM | POA: Diagnosis not present

## 2019-05-27 DIAGNOSIS — L89154 Pressure ulcer of sacral region, stage 4: Secondary | ICD-10-CM | POA: Diagnosis not present

## 2019-05-27 DIAGNOSIS — J309 Allergic rhinitis, unspecified: Secondary | ICD-10-CM | POA: Diagnosis not present

## 2019-05-29 DIAGNOSIS — I10 Essential (primary) hypertension: Secondary | ICD-10-CM | POA: Diagnosis not present

## 2019-05-29 DIAGNOSIS — D509 Iron deficiency anemia, unspecified: Secondary | ICD-10-CM | POA: Diagnosis not present

## 2019-05-29 DIAGNOSIS — N400 Enlarged prostate without lower urinary tract symptoms: Secondary | ICD-10-CM | POA: Diagnosis not present

## 2019-05-29 DIAGNOSIS — G8220 Paraplegia, unspecified: Secondary | ICD-10-CM | POA: Diagnosis not present

## 2019-05-29 DIAGNOSIS — L89154 Pressure ulcer of sacral region, stage 4: Secondary | ICD-10-CM | POA: Diagnosis not present

## 2019-05-29 DIAGNOSIS — J309 Allergic rhinitis, unspecified: Secondary | ICD-10-CM | POA: Diagnosis not present

## 2019-05-29 DIAGNOSIS — Z933 Colostomy status: Secondary | ICD-10-CM | POA: Diagnosis not present

## 2019-05-29 DIAGNOSIS — N319 Neuromuscular dysfunction of bladder, unspecified: Secondary | ICD-10-CM | POA: Diagnosis not present

## 2019-05-29 DIAGNOSIS — S24104S Unspecified injury at T11-T12 level of thoracic spinal cord, sequela: Secondary | ICD-10-CM | POA: Diagnosis not present

## 2019-05-30 DIAGNOSIS — Z933 Colostomy status: Secondary | ICD-10-CM | POA: Diagnosis not present

## 2019-05-30 DIAGNOSIS — G8220 Paraplegia, unspecified: Secondary | ICD-10-CM | POA: Diagnosis not present

## 2019-05-30 DIAGNOSIS — L89154 Pressure ulcer of sacral region, stage 4: Secondary | ICD-10-CM | POA: Diagnosis not present

## 2019-05-30 DIAGNOSIS — N401 Enlarged prostate with lower urinary tract symptoms: Secondary | ICD-10-CM | POA: Diagnosis not present

## 2019-05-30 DIAGNOSIS — Z4800 Encounter for change or removal of nonsurgical wound dressing: Secondary | ICD-10-CM | POA: Diagnosis not present

## 2019-05-30 DIAGNOSIS — N319 Neuromuscular dysfunction of bladder, unspecified: Secondary | ICD-10-CM | POA: Diagnosis not present

## 2019-05-30 DIAGNOSIS — I10 Essential (primary) hypertension: Secondary | ICD-10-CM | POA: Diagnosis not present

## 2019-05-30 DIAGNOSIS — M069 Rheumatoid arthritis, unspecified: Secondary | ICD-10-CM | POA: Diagnosis not present

## 2019-05-30 DIAGNOSIS — D649 Anemia, unspecified: Secondary | ICD-10-CM | POA: Diagnosis not present

## 2019-06-02 ENCOUNTER — Emergency Department: Payer: Medicare HMO

## 2019-06-02 ENCOUNTER — Inpatient Hospital Stay
Admission: EM | Admit: 2019-06-02 | Discharge: 2019-06-05 | DRG: 871 | Disposition: A | Discharge status: Home Health | Payer: Medicare HMO | Attending: Family Medicine | Admitting: Family Medicine

## 2019-06-02 ENCOUNTER — Other Ambulatory Visit: Payer: Self-pay

## 2019-06-02 ENCOUNTER — Encounter: Payer: Self-pay | Admitting: Radiology

## 2019-06-02 DIAGNOSIS — G8220 Paraplegia, unspecified: Secondary | ICD-10-CM | POA: Diagnosis present

## 2019-06-02 DIAGNOSIS — M869 Osteomyelitis, unspecified: Secondary | ICD-10-CM

## 2019-06-02 DIAGNOSIS — R509 Fever, unspecified: Secondary | ICD-10-CM

## 2019-06-02 DIAGNOSIS — Z87891 Personal history of nicotine dependence: Secondary | ICD-10-CM | POA: Diagnosis not present

## 2019-06-02 DIAGNOSIS — A419 Sepsis, unspecified organism: Principal | ICD-10-CM | POA: Diagnosis present

## 2019-06-02 DIAGNOSIS — L89159 Pressure ulcer of sacral region, unspecified stage: Secondary | ICD-10-CM | POA: Diagnosis not present

## 2019-06-02 DIAGNOSIS — E876 Hypokalemia: Secondary | ICD-10-CM | POA: Diagnosis not present

## 2019-06-02 DIAGNOSIS — Z87440 Personal history of urinary (tract) infections: Secondary | ICD-10-CM

## 2019-06-02 DIAGNOSIS — Z20828 Contact with and (suspected) exposure to other viral communicable diseases: Secondary | ICD-10-CM | POA: Diagnosis present

## 2019-06-02 DIAGNOSIS — W3400XS Accidental discharge from unspecified firearms or gun, sequela: Secondary | ICD-10-CM

## 2019-06-02 DIAGNOSIS — Z803 Family history of malignant neoplasm of breast: Secondary | ICD-10-CM

## 2019-06-02 DIAGNOSIS — N390 Urinary tract infection, site not specified: Secondary | ICD-10-CM | POA: Diagnosis not present

## 2019-06-02 DIAGNOSIS — N319 Neuromuscular dysfunction of bladder, unspecified: Secondary | ICD-10-CM | POA: Diagnosis present

## 2019-06-02 DIAGNOSIS — R000 Tachycardia, unspecified: Secondary | ICD-10-CM | POA: Diagnosis not present

## 2019-06-02 DIAGNOSIS — N400 Enlarged prostate without lower urinary tract symptoms: Secondary | ICD-10-CM | POA: Diagnosis present

## 2019-06-02 DIAGNOSIS — M8658 Other chronic hematogenous osteomyelitis, other site: Secondary | ICD-10-CM | POA: Diagnosis not present

## 2019-06-02 DIAGNOSIS — G8389 Other specified paralytic syndromes: Secondary | ICD-10-CM | POA: Diagnosis not present

## 2019-06-02 DIAGNOSIS — Z23 Encounter for immunization: Secondary | ICD-10-CM | POA: Diagnosis not present

## 2019-06-02 DIAGNOSIS — R6510 Systemic inflammatory response syndrome (SIRS) of non-infectious origin without acute organ dysfunction: Secondary | ICD-10-CM

## 2019-06-02 DIAGNOSIS — S31000A Unspecified open wound of lower back and pelvis without penetration into retroperitoneum, initial encounter: Secondary | ICD-10-CM | POA: Diagnosis present

## 2019-06-02 DIAGNOSIS — Z79899 Other long term (current) drug therapy: Secondary | ICD-10-CM | POA: Diagnosis not present

## 2019-06-02 DIAGNOSIS — Z993 Dependence on wheelchair: Secondary | ICD-10-CM

## 2019-06-02 DIAGNOSIS — S14107S Unspecified injury at C7 level of cervical spinal cord, sequela: Secondary | ICD-10-CM

## 2019-06-02 DIAGNOSIS — I10 Essential (primary) hypertension: Secondary | ICD-10-CM | POA: Diagnosis not present

## 2019-06-02 DIAGNOSIS — Z933 Colostomy status: Secondary | ICD-10-CM | POA: Diagnosis not present

## 2019-06-02 DIAGNOSIS — D509 Iron deficiency anemia, unspecified: Secondary | ICD-10-CM | POA: Diagnosis present

## 2019-06-02 DIAGNOSIS — Z8614 Personal history of Methicillin resistant Staphylococcus aureus infection: Secondary | ICD-10-CM | POA: Diagnosis not present

## 2019-06-02 DIAGNOSIS — L89154 Pressure ulcer of sacral region, stage 4: Secondary | ICD-10-CM | POA: Diagnosis present

## 2019-06-02 DIAGNOSIS — I959 Hypotension, unspecified: Secondary | ICD-10-CM | POA: Diagnosis not present

## 2019-06-02 DIAGNOSIS — R6520 Severe sepsis without septic shock: Secondary | ICD-10-CM | POA: Diagnosis present

## 2019-06-02 DIAGNOSIS — M4628 Osteomyelitis of vertebra, sacral and sacrococcygeal region: Secondary | ICD-10-CM | POA: Diagnosis present

## 2019-06-02 DIAGNOSIS — M8668 Other chronic osteomyelitis, other site: Secondary | ICD-10-CM | POA: Diagnosis not present

## 2019-06-02 LAB — COMPREHENSIVE METABOLIC PANEL
ALT: 8 U/L (ref 0–44)
AST: 16 U/L (ref 15–41)
Albumin: 2.7 g/dL — ABNORMAL LOW (ref 3.5–5.0)
Alkaline Phosphatase: 62 U/L (ref 38–126)
Anion gap: 13 (ref 5–15)
BUN: 9 mg/dL (ref 8–23)
CO2: 24 mmol/L (ref 22–32)
Calcium: 8.1 mg/dL — ABNORMAL LOW (ref 8.9–10.3)
Chloride: 101 mmol/L (ref 98–111)
Creatinine, Ser: 0.46 mg/dL — ABNORMAL LOW (ref 0.61–1.24)
GFR calc Af Amer: >60 mL/min (ref >60)
GFR calc non Af Amer: >60 mL/min (ref >60)
Glucose, Bld: 84 mg/dL (ref 70–99)
Potassium: 3.4 mmol/L — ABNORMAL LOW (ref 3.5–5.1)
Sodium: 138 mmol/L (ref 135–145)
Total Bilirubin: 0.5 mg/dL (ref 0.3–1.2)
Total Protein: 7.4 g/dL (ref 6.5–8.1)

## 2019-06-02 LAB — URINALYSIS, ROUTINE W REFLEX MICROSCOPIC
Bilirubin Urine: NEGATIVE
Glucose, UA: NEGATIVE mg/dL
Hgb urine dipstick: NEGATIVE
Ketones, ur: 20 mg/dL — AB
Leukocytes,Ua: NEGATIVE
Nitrite: NEGATIVE
Protein, ur: NEGATIVE mg/dL
Specific Gravity, Urine: 1.025 (ref 1.005–1.030)
pH: 5 (ref 5.0–8.0)

## 2019-06-02 LAB — SEDIMENTATION RATE: Sed Rate: 85 mm/hr — ABNORMAL HIGH (ref 0–20)

## 2019-06-02 LAB — CBC WITH DIFFERENTIAL/PLATELET
Abs Immature Granulocytes: 0.07 10*3/uL (ref 0.00–0.07)
Basophils Absolute: 0 10*3/uL (ref 0.0–0.1)
Basophils Relative: 0 %
Eosinophils Absolute: 0.1 10*3/uL (ref 0.0–0.5)
Eosinophils Relative: 1 %
HCT: 30 % — ABNORMAL LOW (ref 39.0–52.0)
Hemoglobin: 9.1 g/dL — ABNORMAL LOW (ref 13.0–17.0)
Immature Granulocytes: 1 %
Lymphocytes Relative: 11 %
Lymphs Abs: 1.7 10*3/uL (ref 0.7–4.0)
MCH: 19.6 pg — ABNORMAL LOW (ref 26.0–34.0)
MCHC: 30.3 g/dL (ref 30.0–36.0)
MCV: 64.7 fL — ABNORMAL LOW (ref 80.0–100.0)
Monocytes Absolute: 1.4 10*3/uL — ABNORMAL HIGH (ref 0.1–1.0)
Monocytes Relative: 10 %
Neutro Abs: 11.6 10*3/uL — ABNORMAL HIGH (ref 1.7–7.7)
Neutrophils Relative %: 77 %
Platelets: 595 10*3/uL — ABNORMAL HIGH (ref 150–400)
RBC: 4.64 MIL/uL (ref 4.22–5.81)
RDW: 18.2 % — ABNORMAL HIGH (ref 11.5–15.5)
WBC: 14.8 10*3/uL — ABNORMAL HIGH (ref 4.0–10.5)
nRBC: 0 % (ref 0.0–0.2)

## 2019-06-02 LAB — APTT: aPTT: 49 seconds — ABNORMAL HIGH (ref 24–36)

## 2019-06-02 LAB — LACTIC ACID, PLASMA: Lactic Acid, Venous: 1.2 mmol/L (ref 0.5–1.9)

## 2019-06-02 LAB — PROCALCITONIN: Procalcitonin: <0.1 ng/mL

## 2019-06-02 LAB — PROTIME-INR
INR: 1.2 (ref 0.8–1.2)
Prothrombin Time: 14.6 seconds (ref 11.4–15.2)

## 2019-06-02 MED ORDER — SODIUM CHLORIDE 0.9 % IV BOLUS (SEPSIS)
1000.0000 mL | Freq: Once | INTRAVENOUS | Status: AC
  Administered 2019-06-02: 1000 mL via INTRAVENOUS

## 2019-06-02 MED ORDER — IOHEXOL 300 MG/ML  SOLN
100.0000 mL | Freq: Once | INTRAMUSCULAR | Status: AC | PRN
  Administered 2019-06-02: 100 mL via INTRAVENOUS

## 2019-06-02 MED ORDER — SODIUM CHLORIDE 0.9 % IV SOLN
1.0000 g | INTRAVENOUS | Status: DC
  Administered 2019-06-02 – 2019-06-03 (×2): 1 g via INTRAVENOUS
  Filled 2019-06-02: qty 10
  Filled 2019-06-02: qty 1
  Filled 2019-06-02: qty 10

## 2019-06-02 MED ORDER — VANCOMYCIN HCL 2000 MG/400ML IV SOLN
2000.0000 mg | Freq: Once | INTRAVENOUS | Status: AC
  Administered 2019-06-02: 2000 mg via INTRAVENOUS
  Filled 2019-06-02: qty 400

## 2019-06-02 MED ORDER — METRONIDAZOLE IN NACL 5-0.79 MG/ML-% IV SOLN
500.0000 mg | Freq: Once | INTRAVENOUS | Status: AC
  Administered 2019-06-02: 500 mg via INTRAVENOUS
  Filled 2019-06-02: qty 100

## 2019-06-02 MED ORDER — ACETAMINOPHEN 500 MG PO TABS
1000.0000 mg | ORAL_TABLET | Freq: Once | ORAL | Status: AC
  Administered 2019-06-02: 1000 mg via ORAL
  Filled 2019-06-02: qty 2

## 2019-06-02 NOTE — ED Notes (Signed)
Pt requests to wait until 1900 for catheterization.

## 2019-06-02 NOTE — Progress Notes (Signed)
PHARMACY -  BRIEF ANTIBIOTIC NOTE   Pharmacy has received consult(s) for Vancomycin from an ED provider.  The patient's profile has been reviewed for ht/wt/allergies/indication/available labs.    One time order(s) placed for Vancomycin 2g IV x 1 dose.  Further antibiotics/pharmacy consults should be ordered by admitting physician if indicated.                       Thank you, Pearla Dubonnet 06/02/2019  9:42 PM

## 2019-06-02 NOTE — ED Provider Notes (Signed)
[facility] Emergency Department Provider Note  ____________________________________________   First MD Initiated Contact with Patient 06/02/19 1649     (approximate)  I have reviewed the triage vital signs and the nursing notes.  History  Chief Complaint Fever    HPI Bradley [name] is a 73 y.o. male with a history of spinal cord injury with resultant bilateral lower extremity paralysis, colostomy in place, neurogenic bladder requiring intermittent I&O, sacral decubitus ulcer with hx osteomyelitis who presents emergency department for fever and concern for UTI.  Patient states 2 days ago he noticed malodorous and dark urine.  This was associated with fevers.  He called into his clinic yesterday, and was prescribed Macrobid.  He is taken 2 doses of this so far, however he still continues to have fevers.  He is febrile to 100.5 and tachycardic to 111 on arrival.  He denies any cough, difficulty breathing, nausea, vomiting, diarrhea.  He has a colostomy in place, with normal-appearing output.  He denies any sick contacts or known COVID exposures.   Past Medical Hx Past Medical History:  Diagnosis Date  . Anemia   . Arthritis   . Paralysis of both lower limbs ([place])   . Sacral decubitus ulcer     Problem List Patient Active Problem List   Diagnosis Date Noted  . Neurogenic bladder 08/21/2018  . Recurrent UTI 08/21/2018  . Hx of spinal cord injury 08/21/2018  . Sacral decubitus ulcer 06/01/2018  . Osteomyelitis ([place]) 06/01/2018  . Paraplegia ([place]) 06/01/2018  . Iron deficiency anemia 04/19/2017    Past Surgical Hx Past Surgical History:  Procedure Laterality Date  . COLONOSCOPY  06/06/2014  . INCISION AND DRAINAGE OF WOUND N/A 06/04/2018   Procedure: IRRIGATION AND DEBRIDEMENT WOUND;  Surgeon: Olean Ree, MD;  Location: [facility] ORS;  Service: General;  Laterality: N/A;  . TONSILLECTOMY      Medications Prior to Admission medications     Medication Sig Start Date End Date Taking? Authorizing Provider  acetaminophen (TYLENOL) 500 MG tablet Take 1,000 mg by mouth 3 (three) times daily.     [provider]  amLODipine (NORVASC) 5 MG tablet Take 5 mg by mouth daily. 08/29/16   [provider]  baclofen (LIORESAL) 10 MG tablet Take 20 mg by mouth 3 (three) times daily. 11/30/16   [provider]  ferrous sulfate 325 (65 FE) MG tablet Take 325 mg by mouth 2 (two) times daily.    [provider]  lactobacillus (FLORANEX/LACTINEX) PACK Take 1 packet (1 g total) by mouth 3 (three) times daily with meals. 06/21/2018   Salary, Avel Peace, MD  Multiple Vitamin (MULTI-VITAMINS) TABS Take 1 tablet by mouth daily.     [provider]  polyethylene glycol powder (GLYCOLAX/MIRALAX) powder Take 17 g by mouth daily as needed for mild constipation.     [provider]  vitamin C (ASCORBIC ACID) 500 MG tablet Take 500 mg by mouth 2 (two) times daily.    [provider]    Allergies Patient has no known allergies.  Family Hx Family History  Problem Relation Age of Onset  . Breast cancer Mother     Social Hx Social History   Tobacco Use  . Smoking status: Former Smoker    Packs/day: 0.50    Years: 5.00    Pack years: 2.50    Quit date: 1972    Years since quitting: 49.0  . Smokeless tobacco: Never Used  Substance Use Topics  .  Alcohol use: No  . Drug use: No     Review of Systems  Constitutional: + Fever Eyes: Negative for visual changes. ENT: Negative for sore throat. Cardiovascular: Negative for chest pain. Respiratory: Negative for shortness of breath. Gastrointestinal: Negative for nausea, vomiting.  Genitourinary: + Malodorous, dark appearing urine Musculoskeletal: Negative for leg swelling. Skin: Negative for rash. Neurological: Negative for for headaches.   Physical Exam  Vital Signs: ED Triage Vitals [06/02/19 1643]  Enc Vitals Group     BP (!) 121/53      Pulse Rate (!) 111     Resp 18     Temp (!) 100.5 F (38.1 C)     Temp Source Oral     SpO2 100 %     Weight 213 lb (96.6 kg)     Height _0  (1.88 m)     Head Circumference      Peak Flow      Pain Score 0     Pain Loc      Pain Edu?      Excl. in [place]?     Constitutional: Alert and oriented.  Head: Normocephalic. Atraumatic. Eyes: Conjunctivae clear. Sclera anicteric. Nose: No congestion. No rhinorrhea. Mouth/Throat: Wearing mask.  MM slightly dry. Neck: No stridor.   Cardiovascular: Tachycardic, regular rhythm. Extremities well perfused. Respiratory: Normal respiratory effort.  Lungs CTAB. Gastrointestinal: Soft. Non-tender. Non-distended.  Colostomy well-appearing. Musculoskeletal: No lower extremity edema. No deformities. Neurologic:  Normal speech and language.  Bilateral lower extremity paralysis, consistent with history of his spinal cord injury Skin: Large sacral decubitus ulcer.  No surrounding erythema, warmth, induration of the skin.  No palpable crepitance.  No necrotic tissue.  No drainage. Psychiatric: Mood and affect are appropriate for situation.  EKG  Personally reviewed.   Rate: 97 Rhythm: Sinus Axis: Borderline right Intervals: Within normal limits Frequent PVCs, often in trigeminy No STEMI    Radiology  CXR: IMPRESSION:  1. No focal consolidation.  2. Mild interstitial prominence may represent mild congestion.    Procedures  Procedure(s) performed (including critical care):  .Critical Care Performed by: Lilia Pro., MD Authorized by: Lilia Pro., MD   Critical care provider statement:    Critical care time (minutes):  45   Critical care was necessary to treat or prevent imminent or life-threatening deterioration of the following conditions:  Sepsis   Critical care was time spent personally by me on the following activities:  Discussions with consultants, evaluation of patient's response to treatment, examination of patient,  ordering and performing treatments and interventions, ordering and review of laboratory studies, ordering and review of radiographic studies, pulse oximetry, re-evaluation of patient's condition, obtaining history from patient or surrogate and review of old charts     Initial Impression / Assessment and Plan / ED Course  73 y.o. male who presents to the ED for fever, concern for UTI, as above.  Febrile and tachycardic on arrival, concerning for SIRS.  High suspicion for urosepsis based on symptoms.  Also consider acute on chronic osteomyelitis based on hx.  We will obtain labs, including cultures, lactate, inflammatory markers, urine studies.  Will initiate empiric ceftriaxone for presumed UTI.  Labs reveal a leukocytosis to 14.8.  Lactic acid within normal limits.  UA not overwhelmingly consistent with infection, though he has taken several doses of oral antibiotic.  ESR elevated 85.  Added on CT to evaluate for acute on chronic osteomyelitis, expanded coverage to include Flagyl and vancomycin for osteocoverage.  We will plan to admit.  Patient agreeable with plan. Discussed w/ hospitalist for admission.   Final Clinical Impression(s) / ED Diagnosis  Final diagnoses:  Fever in adult  SIRS (systemic inflammatory response syndrome) ([place])       Note:  This document was prepared using Dragon voice recognition software and may include unintentional dictation errors.   Lilia Pro., MD 06/03/19 0111

## 2019-06-02 NOTE — ED Notes (Signed)
Report given to Vanessa, RN.

## 2019-06-02 NOTE — Progress Notes (Signed)
CODE SEPSIS - PHARMACY COMMUNICATION  **Broad Spectrum Antibiotics should be administered within 1 hour of Sepsis diagnosis**  Time Code Sepsis Called/Page Received: 1653  Antibiotics Ordered: Rocephin  Time of 1st antibiotic administration: [id number]  Additional action taken by pharmacy: none  If necessary, Name of Provider/Nurse Contacted: n/a    Pearla Dubonnet ,PharmD Clinical Pharmacist  06/02/2019  6:18 PM

## 2019-06-02 NOTE — ED Triage Notes (Signed)
Pt arrives via ems from home, pt states that he started an antibiotic 2 days ago due to noticing increase in odor change in color with fever, pt reports the odor and color have improved by the temperature has not. Pt states that he is a spinal injury patient who self caths and has a colostomy, pt states his last uti was around a year and a half ago

## 2019-06-03 ENCOUNTER — Other Ambulatory Visit: Payer: Self-pay

## 2019-06-03 DIAGNOSIS — Z993 Dependence on wheelchair: Secondary | ICD-10-CM | POA: Diagnosis not present

## 2019-06-03 DIAGNOSIS — Z87440 Personal history of urinary (tract) infections: Secondary | ICD-10-CM | POA: Diagnosis not present

## 2019-06-03 DIAGNOSIS — N390 Urinary tract infection, site not specified: Secondary | ICD-10-CM | POA: Diagnosis present

## 2019-06-03 DIAGNOSIS — Z23 Encounter for immunization: Secondary | ICD-10-CM | POA: Diagnosis present

## 2019-06-03 DIAGNOSIS — N400 Enlarged prostate without lower urinary tract symptoms: Secondary | ICD-10-CM | POA: Diagnosis present

## 2019-06-03 DIAGNOSIS — Z933 Colostomy status: Secondary | ICD-10-CM | POA: Diagnosis not present

## 2019-06-03 DIAGNOSIS — Z79899 Other long term (current) drug therapy: Secondary | ICD-10-CM

## 2019-06-03 DIAGNOSIS — N319 Neuromuscular dysfunction of bladder, unspecified: Secondary | ICD-10-CM | POA: Diagnosis present

## 2019-06-03 DIAGNOSIS — R509 Fever, unspecified: Secondary | ICD-10-CM

## 2019-06-03 DIAGNOSIS — M8668 Other chronic osteomyelitis, other site: Secondary | ICD-10-CM | POA: Diagnosis not present

## 2019-06-03 DIAGNOSIS — S14107S Unspecified injury at C7 level of cervical spinal cord, sequela: Secondary | ICD-10-CM | POA: Diagnosis not present

## 2019-06-03 DIAGNOSIS — G8389 Other specified paralytic syndromes: Secondary | ICD-10-CM | POA: Diagnosis not present

## 2019-06-03 DIAGNOSIS — Z8614 Personal history of Methicillin resistant Staphylococcus aureus infection: Secondary | ICD-10-CM

## 2019-06-03 DIAGNOSIS — I10 Essential (primary) hypertension: Secondary | ICD-10-CM | POA: Diagnosis present

## 2019-06-03 DIAGNOSIS — M869 Osteomyelitis, unspecified: Secondary | ICD-10-CM | POA: Diagnosis not present

## 2019-06-03 DIAGNOSIS — D509 Iron deficiency anemia, unspecified: Secondary | ICD-10-CM | POA: Diagnosis present

## 2019-06-03 DIAGNOSIS — A419 Sepsis, unspecified organism: Secondary | ICD-10-CM | POA: Diagnosis present

## 2019-06-03 DIAGNOSIS — Z803 Family history of malignant neoplasm of breast: Secondary | ICD-10-CM | POA: Diagnosis not present

## 2019-06-03 DIAGNOSIS — E876 Hypokalemia: Secondary | ICD-10-CM | POA: Diagnosis present

## 2019-06-03 DIAGNOSIS — M4628 Osteomyelitis of vertebra, sacral and sacrococcygeal region: Secondary | ICD-10-CM | POA: Diagnosis present

## 2019-06-03 DIAGNOSIS — Z87891 Personal history of nicotine dependence: Secondary | ICD-10-CM

## 2019-06-03 DIAGNOSIS — R6510 Systemic inflammatory response syndrome (SIRS) of non-infectious origin without acute organ dysfunction: Secondary | ICD-10-CM | POA: Diagnosis present

## 2019-06-03 DIAGNOSIS — G8220 Paraplegia, unspecified: Secondary | ICD-10-CM | POA: Diagnosis present

## 2019-06-03 DIAGNOSIS — W3400XS Accidental discharge from unspecified firearms or gun, sequela: Secondary | ICD-10-CM | POA: Diagnosis not present

## 2019-06-03 DIAGNOSIS — L89154 Pressure ulcer of sacral region, stage 4: Secondary | ICD-10-CM

## 2019-06-03 DIAGNOSIS — Z20828 Contact with and (suspected) exposure to other viral communicable diseases: Secondary | ICD-10-CM | POA: Diagnosis present

## 2019-06-03 LAB — CBC
HCT: 28.8 % — ABNORMAL LOW (ref 39.0–52.0)
Hemoglobin: 8.4 g/dL — ABNORMAL LOW (ref 13.0–17.0)
MCH: 19.9 pg — ABNORMAL LOW (ref 26.0–34.0)
MCHC: 29.2 g/dL — ABNORMAL LOW (ref 30.0–36.0)
MCV: 68.1 fL — ABNORMAL LOW (ref 80.0–100.0)
Platelets: 561 10*3/uL — ABNORMAL HIGH (ref 150–400)
RBC: 4.23 MIL/uL (ref 4.22–5.81)
RDW: 18 % — ABNORMAL HIGH (ref 11.5–15.5)
WBC: 10.3 10*3/uL (ref 4.0–10.5)
nRBC: 0 % (ref 0.0–0.2)

## 2019-06-03 LAB — LACTIC ACID, PLASMA
Lactic Acid, Venous: 0.9 mmol/L (ref 0.5–1.9)
Lactic Acid, Venous: 1.8 mmol/L (ref 0.5–1.9)

## 2019-06-03 LAB — COMPREHENSIVE METABOLIC PANEL
ALT: 10 U/L (ref 0–44)
AST: 19 U/L (ref 15–41)
Albumin: 2.3 g/dL — ABNORMAL LOW (ref 3.5–5.0)
Alkaline Phosphatase: 55 U/L (ref 38–126)
Anion gap: 8 (ref 5–15)
BUN: 6 mg/dL — ABNORMAL LOW (ref 8–23)
CO2: 26 mmol/L (ref 22–32)
Calcium: 7.9 mg/dL — ABNORMAL LOW (ref 8.9–10.3)
Chloride: 106 mmol/L (ref 98–111)
Creatinine, Ser: 0.43 mg/dL — ABNORMAL LOW (ref 0.61–1.24)
GFR calc Af Amer: >60 mL/min (ref >60)
GFR calc non Af Amer: >60 mL/min (ref >60)
Glucose, Bld: 103 mg/dL — ABNORMAL HIGH (ref 70–99)
Potassium: 3.1 mmol/L — ABNORMAL LOW (ref 3.5–5.1)
Sodium: 140 mmol/L (ref 135–145)
Total Bilirubin: 0.7 mg/dL (ref 0.3–1.2)
Total Protein: 6.6 g/dL (ref 6.5–8.1)

## 2019-06-03 LAB — RESPIRATORY PANEL BY RT PCR (FLU A&B, COVID)
Influenza A by PCR: NEGATIVE
Influenza B by PCR: NEGATIVE
SARS Coronavirus 2 by RT PCR: NEGATIVE

## 2019-06-03 LAB — PHOSPHORUS: Phosphorus: 2.8 mg/dL (ref 2.5–4.6)

## 2019-06-03 LAB — MAGNESIUM: Magnesium: 2.3 mg/dL (ref 1.7–2.4)

## 2019-06-03 LAB — TSH: TSH: 2.282 u[IU]/mL (ref 0.350–4.500)

## 2019-06-03 MED ORDER — ONDANSETRON HCL 4 MG PO TABS: 4.0000 mg | ORAL_TABLET | Freq: Four times a day (QID) | ORAL | Status: DC | PRN

## 2019-06-03 MED ORDER — VANCOMYCIN HCL 1500 MG/300ML IV SOLN: 1500.0000 mg | Freq: Two times a day (BID) | INTRAVENOUS | Status: DC

## 2019-06-03 MED ORDER — VANCOMYCIN HCL IN DEXTROSE 1-5 GM/200ML-% IV SOLN
1000.0000 mg | Freq: Two times a day (BID) | INTRAVENOUS | Status: DC
  Administered 2019-06-03 – 2019-06-04 (×3): 1000 mg via INTRAVENOUS
  Filled 2019-06-03 (×4): qty 200

## 2019-06-03 MED ORDER — SODIUM CHLORIDE 0.9 % IV SOLN: INTRAVENOUS | Status: AC

## 2019-06-03 MED ORDER — ENOXAPARIN SODIUM 40 MG/0.4ML ~~LOC~~ SOLN
40.0000 mg | SUBCUTANEOUS | Status: DC
  Administered 2019-06-03 – 2019-06-04 (×2): 40 mg via SUBCUTANEOUS
  Filled 2019-06-03 (×2): qty 0.4

## 2019-06-03 MED ORDER — ONDANSETRON HCL 4 MG/2ML IJ SOLN: 4.0000 mg | Freq: Four times a day (QID) | INTRAMUSCULAR | Status: DC | PRN

## 2019-06-03 MED ORDER — METRONIDAZOLE IN NACL 5-0.79 MG/ML-% IV SOLN
500.0000 mg | Freq: Three times a day (TID) | INTRAVENOUS | Status: DC
  Administered 2019-06-03 – 2019-06-04 (×4): 500 mg via INTRAVENOUS
  Filled 2019-06-03 (×6): qty 100

## 2019-06-03 MED ORDER — HYDROCODONE-ACETAMINOPHEN 5-325 MG PO TABS: 1.0000 | ORAL_TABLET | ORAL | Status: DC | PRN

## 2019-06-03 MED ORDER — ACETAMINOPHEN 650 MG RE SUPP: 650.0000 mg | Freq: Four times a day (QID) | RECTAL | Status: DC | PRN

## 2019-06-03 MED ORDER — INFLUENZA VAC A&B SA ADJ QUAD 0.5 ML IM PRSY
0.5000 mL | PREFILLED_SYRINGE | INTRAMUSCULAR | Status: AC
  Administered 2019-06-04: 0.5 mL via INTRAMUSCULAR
  Filled 2019-06-03: qty 0.5

## 2019-06-03 MED ORDER — ACETAMINOPHEN 325 MG PO TABS: 650.0000 mg | ORAL_TABLET | Freq: Four times a day (QID) | ORAL | Status: DC | PRN

## 2019-06-03 MED ORDER — DICLOFENAC SODIUM 1 % EX GEL
2.0000 g | Freq: Four times a day (QID) | CUTANEOUS | Status: DC | PRN
  Administered 2019-06-04: 2 g via TOPICAL
  Filled 2019-06-03: qty 100

## 2019-06-03 MED ORDER — POTASSIUM CHLORIDE CRYS ER 20 MEQ PO TBCR
40.0000 meq | EXTENDED_RELEASE_TABLET | Freq: Once | ORAL | Status: AC
  Administered 2019-06-03: 23:00:00 40 meq via ORAL
  Filled 2019-06-03: qty 2

## 2019-06-03 MED ORDER — POTASSIUM CHLORIDE CRYS ER 20 MEQ PO TBCR
40.0000 meq | EXTENDED_RELEASE_TABLET | Freq: Once | ORAL | Status: AC
  Administered 2019-06-03: 40 meq via ORAL
  Filled 2019-06-03: qty 2

## 2019-06-03 NOTE — Consult Note (Signed)
[facility] Nurse ostomy consult note Stoma type/location: LLQ colostomy Stomal assessment/size: 1 3/4" pink patent and producing soft brown stool Peristomal assessment: intact  Barrier ring added to improve wear time.  Treatment options for stomal/peristomal skin:barrier ring and 2 piece pouch  As used at home  Output soft brown stool Ostomy pouching: 2pc. 2 1/4" pouch with barrier ring  Education provided: none Enrolled patient in [place] program: No WOC Nurse Consult Note: Reason for Consult:Nonhealing stage 4 pressure injury to sacrum.  Seen at [facility] wound care.  NPWT therapy was discontinued last week due to excessive bleeding, the patient reports. He has been getting NS moist dressings daily since.  Wound type: Pressure Injury POA: Yes/No/NA Measurement: 18 cm x 12 cm x 6.4 cm with undermining circumferentially, extending 1 cm  Wound [id number] pink and slick Drainage (amount, consistency, odor) Moderate serosanguinous  Musty odor Periwound:intact Dressing procedure/placement/frequency: Cleanse sacral wound with NS.  Apply Aquacel Ag to wound bed and fill in remaining dead space with ns moist kerlix.  Change daily.  Will not follow at this time.  Please re-consult if needed.  Domenic Moras MSN, RN, FNP-BC CWON Wound, Ostomy, Continence Nurse Pager [phone]

## 2019-06-03 NOTE — Plan of Care (Signed)
  Problem: Education: Goal: Knowledge of General Education information will improve Description: Including pain rating scale, medication(s)/side effects and non-pharmacologic comfort measures Outcome: Progressing   Problem: Health Behavior/Discharge Planning: Goal: Ability to manage health-related needs will improve Outcome: Progressing   Problem: Clinical Measurements: Goal: Ability to maintain clinical measurements within normal limits will improve Outcome: Progressing Goal: Will remain free from infection Outcome: Progressing Goal: Respiratory complications will improve Outcome: Progressing Goal: Cardiovascular complication will be avoided Outcome: Progressing   Problem: Nutrition: Goal: Adequate nutrition will be maintained Outcome: Progressing   Problem: Coping: Goal: Level of anxiety will decrease Outcome: Progressing   Problem: Elimination: Goal: Will not experience complications related to bowel motility Outcome: Progressing   Problem: Pain Managment: Goal: General experience of comfort will improve Outcome: Progressing   Problem: Safety: Goal: Ability to remain free from injury will improve Outcome: Progressing   Problem: Skin Integrity: Goal: Risk for impaired skin integrity will decrease Outcome: Progressing
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
## 2019-06-03 NOTE — ED Notes (Signed)
Report to floor, patient prepared for transport to room 127.
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
## 2019-06-03 NOTE — Progress Notes (Signed)
Pharmacy Antibiotic Note  Bradley [name] is a 73 y.o. male admitted on 06/02/2019 with osteomyelitis.  Pharmacy has been consulted for Vancomycin dosing.  Plan: Vancomycin 1500 mg IV Q 12 hrs. Goal AUC 400-550. Expected AUC: 529.9 SCr used: 0.8   Height: 6\' 2"  (188 cm) Weight: 213 lb (96.6 kg) IBW/kg (Calculated) : 82.2  Temp (24hrs), Avg:99.3 F (37.4 C), Min:98.1 F (36.7 C), Max:100.5 F (38.1 C)  Recent Labs  Lab 06/02/19 1658  WBC 14.8*  CREATININE 0.46*  LATICACIDVEN 1.2    Estimated Creatinine Clearance: 92.8 mL/min (A) (by C-G formula based on SCr of 0.46 mg/dL (L)).    No Known Allergies  Antimicrobials this admission: Rocephin x 1 Flagyl x 1 Vancomycin 12/27 >>  Dose adjustments this admission:   Microbiology results:  BCx:   UCx:    Sputum:    MRSA PCR:   Thank you for allowing pharmacy to be a part of this patient's care.  Hart Robinsons A 06/03/2019 3:58 AM

## 2019-06-03 NOTE — ED Notes (Signed)
Dressing to coccyx removed. Cavernous decubiti noted with slight serous drainage. Sterile ABD dressing cover awaiting other instructions. Patient positioned to L side. Condom cath draining clear yellow urine. Colostomy bag noted with loose brown stool.

## 2019-06-04 ENCOUNTER — Encounter: Payer: Self-pay | Admitting: Internal Medicine

## 2019-06-04 DIAGNOSIS — R6510 Systemic inflammatory response syndrome (SIRS) of non-infectious origin without acute organ dysfunction: Secondary | ICD-10-CM

## 2019-06-04 DIAGNOSIS — M8658 Other chronic hematogenous osteomyelitis, other site: Secondary | ICD-10-CM

## 2019-06-04 DIAGNOSIS — M4628 Osteomyelitis of vertebra, sacral and sacrococcygeal region: Secondary | ICD-10-CM

## 2019-06-04 LAB — BASIC METABOLIC PANEL
Anion gap: 8 (ref 5–15)
BUN: 6 mg/dL — ABNORMAL LOW (ref 8–23)
CO2: 24 mmol/L (ref 22–32)
Calcium: 8 mg/dL — ABNORMAL LOW (ref 8.9–10.3)
Chloride: 108 mmol/L (ref 98–111)
Creatinine, Ser: 0.49 mg/dL — ABNORMAL LOW (ref 0.61–1.24)
GFR calc Af Amer: >60 mL/min (ref >60)
GFR calc non Af Amer: >60 mL/min (ref >60)
Glucose, Bld: 98 mg/dL (ref 70–99)
Potassium: 3.5 mmol/L (ref 3.5–5.1)
Sodium: 140 mmol/L (ref 135–145)

## 2019-06-04 LAB — URINE CULTURE: Culture: NO GROWTH

## 2019-06-04 MED ORDER — ASCORBIC ACID 500 MG PO TABS
500.0000 mg | ORAL_TABLET | Freq: Two times a day (BID) | ORAL | Status: DC
  Administered 2019-06-04: 22:00:00 500 mg via ORAL
  Filled 2019-06-04: qty 1

## 2019-06-04 MED ORDER — SODIUM CHLORIDE 0.9 % IV SOLN
1.5000 g | Freq: Four times a day (QID) | INTRAVENOUS | Status: DC
  Administered 2019-06-04 – 2019-06-05 (×4): 1.5 g via INTRAVENOUS
  Filled 2019-06-04 (×3): qty 1.5
  Filled 2019-06-04 (×3): qty 4
  Filled 2019-06-04: qty 1.5
  Filled 2019-06-04: qty 4

## 2019-06-04 MED ORDER — ENSURE MAX PROTEIN PO LIQD
11.0000 [oz_av] | Freq: Two times a day (BID) | ORAL | Status: DC
  Filled 2019-06-04: qty 330

## 2019-06-04 MED ORDER — OCUVITE-LUTEIN PO CAPS
1.0000 | ORAL_CAPSULE | Freq: Every day | ORAL | Status: DC
  Administered 2019-06-05: 1 via ORAL
  Filled 2019-06-04: qty 1

## 2019-06-04 MED ORDER — SODIUM CHLORIDE 0.9 % IV SOLN
INTRAVENOUS | Status: DC | PRN
  Administered 2019-06-04 – 2019-06-05 (×2): 250 mL via INTRAVENOUS

## 2019-06-04 NOTE — TOC Initial Note (Signed)
Transition of Care [facility]) - Initial/Assessment Note    Patient Details  Name: Bradley [name] MRN: [id number] Date of Birth: [birth year]  [facility] CM/SW Contact:    Shelbie Hutching, RN Phone Number: 06/04/2019, 10:18 AM  Clinical Narrative:                 Patient admitted with sepsis, patient has a chronic stage 4 sacral decubitus ulcer.  Patient has been on IV antibiotics in the past on several occasions for his chronic osteomyelitis.  Patient reports that in the past with IV antibiotics he has gone to skilled nursing rehab, last time at [facility].  Patient's goal is to return home with home health, open currently with [facility].  Jana Half with [facility] is aware of current hospital admission.   Patient lives with son, [name].  Patient has a clinitron sand bed at home and a wheelchair.  Patient's sister provides transportation to appointments, patient goes to the [facility] wound care center.  Patient's son takes care of the patient at home and is able to lift him into the wheelchair and car when needed.   Patient's son will come pick up patient at discharge.   Expected Discharge Plan: [place] Barriers to Discharge: Continued Medical Work up   Patient Goals and CMS Choice Patient states their goals for this hospitalization and ongoing recovery are:: to find out what's wrong CMS Medicare.gov Compare Post Acute Care list provided to:: Patient Choice offered to / list presented to : Patient  Expected Discharge Plan and Services Expected Discharge Plan: [place]   Discharge Planning Services: CM Consult Post Acute Care Choice: Resumption of Svcs/PTA Provider Living arrangements for the past 2 months: Single Family Home                           HH Arranged: RN, Nurse's Aide [place] Agency: [facility] Date [facility] Agency Contacted: 06/04/19 Time [place]: 18 Representative spoke with at [place]: Jana Half  Prior Living Arrangements/Services Living arrangements for the past 2 months: [place] with:: Adult Children(son Micheal) Patient language and need for interpreter reviewed:: Yes Do you feel safe going back to the place where you live?: Yes      Need for Family Participation in Patient Care: Yes (Comment)(bedbound- stage 4 sacral decubitus) Care giver support system in place?: Yes (comment)(Son and sister) Current home services: DME, Home RN(Hospital Bed, wheelchair) Criminal Activity/Legal Involvement Pertinent to Current Situation/Hospitalization: No - Comment as needed  Activities of Daily Living Home Assistive Devices/Equipment: Wheelchair ADL Screening (condition at time of admission) Patient's cognitive ability adequate to safely complete daily activities?: Yes Is the patient deaf or have difficulty hearing?: No Does the patient have difficulty seeing, even when wearing glasses/contacts?: No Does the patient have difficulty concentrating, remembering, or making decisions?: No Patient able to express need for assistance with ADLs?: Yes Does the patient have difficulty dressing or bathing?: Yes Independently performs ADLs?: No Communication: Independent Dressing (OT): Needs assistance Is this a change from baseline?: Pre-admission baseline Grooming: Needs assistance Is this a change from baseline?: Pre-admission baseline Feeding: Independent Bathing: Needs assistance Is this a change from baseline?: Pre-admission baseline Toileting: Needs assistance Is this a change from baseline?: Pre-admission baseline In/Out Bed: Needs assistance, Dependent Is this a change from baseline?: Pre-admission baseline Walks in Home: Dependent Is this a change from baseline?: Pre-admission  baseline Does the patient have difficulty walking or climbing stairs?: Yes Weakness of Legs: Both Weakness of Arms/Hands: None  Permission Sought/Granted Permission sought to share  information with : Case Manager, Family Supports, Other (comment) Permission granted to share information with : Yes, Verbal Permission Granted     Permission granted to share info w AGENCY: [facility]  Permission granted to share info w Relationship: Son Education administrator     Emotional Assessment Appearance:: Appears stated age Attitude/Demeanor/Rapport: Engaged Affect (typically observed): Accepting Orientation: : Oriented to Self, Oriented to Place, Oriented to  Time, Oriented to Situation Alcohol / Substance Use: Not Applicable Psych Involvement: No (comment)  Admission diagnosis:  SIRS (systemic inflammatory response syndrome) (HCC) [R65.10] Fever in adult [R50.9] Sepsis [facility]) [A41.9] Patient Active Problem List   Diagnosis Date Noted  . Sepsis ([place]) 06/02/2019  . Neurogenic bladder 08/21/2018  . Recurrent UTI 08/21/2018  . Hx of spinal cord injury 08/21/2018  . Sacral decubitus ulcer 06/01/2018  . Osteomyelitis ([place]) 06/01/2018  . Paraplegia ([place]) 06/01/2018  . Iron deficiency anemia 04/19/2017   PCP:  [name], MD Pharmacy:   [id number] [place], [place] - [address] [address] Phone: [phone] Fax: [phone]     Social Determinants of Health (SDOH) Interventions    Readmission Risk Interventions No flowsheet data found.

## 2019-06-04 NOTE — Progress Notes (Signed)
ID No fever Says he is feeling better  Patient Vitals for the past 24 hrs:  BP Temp Temp src Pulse Resp SpO2  06/04/19 0731 (!) 133/58 97.8 F (36.6 C) Oral 79 (!) 22 100 %  06/03/19 1932 (!) 130/59 98.5 F (36.9 C) Oral 79 18 100 %    O/e well Decubitus was not examine today  CBC Latest Ref Rng & Units 06/03/2019 06/02/2019 06/05/2018  WBC 4.0 - 10.5 K/uL 10.3 14.8(H) 7.9  Hemoglobin 13.0 - 17.0 g/dL 8.4(L) 9.1(L) 9.1(L)  Hematocrit 39.0 - 52.0 % 28.8(L) 30.0(L) 32.4(L)  Platelets 150 - 400 K/uL 561(H) 595(H) 501(H)    CMP Latest Ref Rng & Units 06/04/2019 06/03/2019 06/02/2019  Glucose 70 - 99 mg/dL 98 103(H) 84  BUN 8 - 23 mg/dL 6(L) 6(L) 9  Creatinine 0.61 - 1.24 mg/dL 0.49(L) 0.43(L) 0.46(L)  Sodium 135 - 145 mmol/L 140 140 138  Potassium 3.5 - 5.1 mmol/L 3.5 3.1(L) 3.4(L)  Chloride 98 - 111 mmol/L 108 106 101  CO2 22 - 32 mmol/L 24 26 24   Calcium 8.9 - 10.3 mg/dL 8.0(L) 7.9(L) 8.1(L)  Total Protein 6.5 - 8.1 g/dL - 6.6 7.4  Total Bilirubin 0.3 - 1.2 mg/dL - 0.7 0.5  Alkaline Phos 38 - 126 U/L - 55 62  AST 15 - 41 U/L - 19 16  ALT 0 - 44 U/L - 10 8    Impression/recommendation  Stage IV sacral decubitus chronic with chronic osteomyelitis and treated thrice before in the past 24 months with 6 weeks of IV antibiotics HE came in with low grade fever- UC neg, wound does not look overtly infected- will continue local wound care as recommended by [facility] wound clinic as Op.As  blood culture remains neg will Dc IV vanco/ceftriaxone and metronidazole and change to unasyn . On discharge can be switched to Po augmentin for a total of 2 weeks- follow up [place] wound clinic- left a message for them

## 2019-06-04 NOTE — Consult Note (Signed)
Subjective:   CC: Decubitus ulcer  HPI:  Bradley [name] is a 73 y.o. male who was consulted by [facility] for evaluation of above.  Chronic wound requiring local wound care by [facility] wound care, had a wound VAC applied to it until recently switched over to wet-to-dry with Aquacel at the base.  Patient came in with possible sepsis, no obvious etiology at this point except for possible osteomyelitis per report.  Surgery consulted for wound care management.   Past Medical History:  has a past medical history of Anemia, Arthritis, Paralysis of both lower limbs ([place]), and Sacral decubitus ulcer.  Past Surgical History:  has a past surgical history that includes Tonsillectomy; Colonoscopy (06/06/2014); and Incision and drainage of wound (N/A, 06/04/2018).  Family History: family history includes Breast cancer in his mother.  Social History:  reports that he quit smoking about 49 years ago. He has a 2.50 pack-year smoking history. He has never used smokeless tobacco. He reports that he does not drink alcohol or use drugs.  Current Medications:  Medications Prior to Admission  Medication Sig Dispense Refill  . acetaminophen (TYLENOL) 500 MG tablet Take 1,000 mg by mouth 3 (three) times daily.     Marland Kitchen amLODipine (NORVASC) 5 MG tablet Take 5 mg by mouth daily.    . baclofen (LIORESAL) 10 MG tablet Take 20 mg by mouth 3 (three) times daily.    . finasteride (PROSCAR) 5 MG tablet Take 5 mg by mouth daily.    . Multiple Vitamin (MULTI-VITAMINS) TABS Take 1 tablet by mouth daily.     . vitamin C (ASCORBIC ACID) 500 MG tablet Take 500 mg by mouth 2 (two) times daily.    . fluticasone (FLONASE) 50 MCG/ACT nasal spray Place 1 spray into both nostrils daily as needed for allergies.    . polyethylene glycol powder (GLYCOLAX/MIRALAX) powder Take 17 g by mouth daily as needed for mild constipation.       Allergies:  No Known Allergies  ROS:  General: Denies weight loss, weight gain, fatigue, fevers, chills, and  night sweats. Eyes: Denies blurry vision, double vision, eye pain, itchy eyes, and tearing. Ears: Denies hearing loss, earache, and ringing in ears. Nose: Denies sinus pain, congestion, infections, runny nose, and nosebleeds. Mouth/throat: Denies hoarseness, sore throat, bleeding gums, and difficulty swallowing. Heart: Denies chest pain, palpitations, racing heart, irregular heartbeat, leg pain or swelling, and decreased activity tolerance. Respiratory: Denies breathing difficulty, shortness of breath, wheezing, cough, and sputum. GI: Denies change in appetite, heartburn, nausea, vomiting, constipation, diarrhea, and blood in stool. GU: Denies difficulty urinating, pain with urinating, urgency, frequency, blood in urine. Musculoskeletal: Denies joint stiffness, pain, swelling, muscle weakness. Skin: Denies rash, itching, mass, tumors, sores, and boils Neurologic: Denies headache, fainting, dizziness, seizures, numbness, and tingling. Psychiatric: Denies depression, anxiety, difficulty sleeping, and memory loss. Endocrine: Denies heat or cold intolerance, and increased thirst or urination. Blood/lymph: Denies easy bruising, easy bruising, and swollen glands     Objective:     BP (!) 133/58 (BP Location: Right Arm)   Pulse 79   Temp 97.8 F (36.6 C) (Oral)   Resp (!) 22   Ht 6\' 2"  (1.88 m)   Wt 96.6 kg   SpO2 100%   BMI 27.35 kg/m   Constitutional :  alert, cooperative, appears stated age and no distress  Lymphatics/Throat:  no asymmetry, masses, or scars  Respiratory:  clear to auscultation bilaterally  Cardiovascular:  regular rate and rhythm  Gastrointestinal: soft, non-tender;  bowel sounds normal; no masses,  no organomegaly.  Musculoskeletal: Steady gait and movement  Skin: Cool and moist, stage IV sacral decub ulcer with pink granulation tissue throughout woundbed, no purulent drainage or necrotic tissue noted.  Nontender.  No active bleeding.  Psychiatric: Normal affect,  non-agitated, not confused       LABS:  CMP Latest Ref Rng & Units 06/04/2019 06/03/2019 06/02/2019  Glucose 70 - 99 mg/dL 98 103(H) 84  BUN 8 - 23 mg/dL 6(L) 6(L) 9  Creatinine 0.61 - 1.24 mg/dL 0.49(L) 0.43(L) 0.46(L)  Sodium 135 - 145 mmol/L 140 140 138  Potassium 3.5 - 5.1 mmol/L 3.5 3.1(L) 3.4(L)  Chloride 98 - 111 mmol/L 108 106 101  CO2 22 - 32 mmol/L 24 26 24   Calcium 8.9 - 10.3 mg/dL 8.0(L) 7.9(L) 8.1(L)  Total Protein 6.5 - 8.1 g/dL - 6.6 7.4  Total Bilirubin 0.3 - 1.2 mg/dL - 0.7 0.5  Alkaline Phos 38 - 126 U/L - 55 62  AST 15 - 41 U/L - 19 16  ALT 0 - 44 U/L - 10 8   CBC Latest Ref Rng & Units 06/03/2019 06/02/2019 06/05/2018  WBC 4.0 - 10.5 K/uL 10.3 14.8(H) 7.9  Hemoglobin 13.0 - 17.0 g/dL 8.4(L) 9.1(L) 9.1(L)  Hematocrit 39.0 - 52.0 % 28.8(L) 30.0(L) 32.4(L)  Platelets 150 - 400 K/uL 561(H) 595(H) 501(H)    RADS: n/a  Assessment:      Stage IV decubitus ulcer  Plan:     wound is clean, no evidence of obvious infection requiring surgical intervention. local wound care as recommended by wound care RN for now.  Surgery will be signing off.  Please call with any additional questions or concerns.  Further care regarding hospitalization per primary team.  Outpatient recommendation will be patient keep his wound care appointment scheduled for mid January with [facility] for continued care of his chronic sacral ulcer.
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
## 2019-06-05 DIAGNOSIS — Z23 Encounter for immunization: Secondary | ICD-10-CM | POA: Diagnosis not present

## 2019-06-05 DIAGNOSIS — M8668 Other chronic osteomyelitis, other site: Secondary | ICD-10-CM

## 2019-06-05 LAB — CREATININE, SERUM
Creatinine, Ser: 0.35 mg/dL — ABNORMAL LOW (ref 0.61–1.24)
GFR calc Af Amer: >60 mL/min (ref >60)
GFR calc non Af Amer: >60 mL/min (ref >60)

## 2019-06-05 LAB — VANCOMYCIN, PEAK: Vancomycin Pk: 10 ug/mL — ABNORMAL LOW (ref 30–40)

## 2019-06-05 MED ORDER — AMOXICILLIN-POT CLAVULANATE 875-125 MG PO TABS: 1.0000 | ORAL_TABLET | Freq: Two times a day (BID) | ORAL | 0 refills | Status: AC

## 2019-06-05 NOTE — TOC Transition Note (Signed)
Transition of Care [facility]) - CM/SW Discharge Note   Patient Details  Name: Bradley [name] MRN: [id number] Date of Birth: 1946/02/27  [facility]) CM/SW Contact:  Su Hilt, RN Phone Number: 06/05/2019, 1:58 PM   Clinical Narrative:    Patient to be discharged home today with his son to transport,  The patient has DME at home and does not need additional.  [facility] is set up for [facility] services   Final next level of care: [place] Barriers to Discharge: Barriers Resolved   Patient Goals and CMS Choice Patient states their goals for this hospitalization and ongoing recovery are:: to find out what's wrong CMS Medicare.gov Compare Post Acute Care list provided to:: Patient Choice offered to / list presented to : Patient  Discharge Placement                       Discharge Plan and Services   Discharge Planning Services: CM Consult Post Acute Care Choice: Resumption of Svcs/PTA Provider          DME Arranged: N/A         HH Arranged: RN, Nurse's Aide HH Agency: [facility] Date [place] Agency Contacted: 06/04/19 Time [place]: [phone] Representative spoke with at [place]: [place] ([place]) Interventions     Readmission Risk Interventions No flowsheet data found.

## 2019-06-05 NOTE — Progress Notes (Signed)
Pt d/c to home via sons. All belongings sent with pt. VSS. IVs removed intact. Education completed.All questions answered.
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
## 2019-06-07 DIAGNOSIS — J309 Allergic rhinitis, unspecified: Secondary | ICD-10-CM | POA: Diagnosis not present

## 2019-06-07 DIAGNOSIS — S24104S Unspecified injury at T11-T12 level of thoracic spinal cord, sequela: Secondary | ICD-10-CM | POA: Diagnosis not present

## 2019-06-07 DIAGNOSIS — N400 Enlarged prostate without lower urinary tract symptoms: Secondary | ICD-10-CM | POA: Diagnosis not present

## 2019-06-07 DIAGNOSIS — I10 Essential (primary) hypertension: Secondary | ICD-10-CM | POA: Diagnosis not present

## 2019-06-07 DIAGNOSIS — N319 Neuromuscular dysfunction of bladder, unspecified: Secondary | ICD-10-CM | POA: Diagnosis not present

## 2019-06-07 DIAGNOSIS — G8220 Paraplegia, unspecified: Secondary | ICD-10-CM | POA: Diagnosis not present

## 2019-06-07 DIAGNOSIS — L8915 Pressure ulcer of sacral region: Secondary | ICD-10-CM | POA: Diagnosis not present

## 2019-06-07 DIAGNOSIS — D509 Iron deficiency anemia, unspecified: Secondary | ICD-10-CM | POA: Diagnosis not present

## 2019-06-07 DIAGNOSIS — L89154 Pressure ulcer of sacral region, stage 4: Secondary | ICD-10-CM | POA: Diagnosis not present

## 2019-06-07 DIAGNOSIS — Z933 Colostomy status: Secondary | ICD-10-CM | POA: Diagnosis not present

## 2019-06-07 LAB — CULTURE, BLOOD (ROUTINE X 2)
Culture: NO GROWTH
Culture: NO GROWTH
Special Requests: ADEQUATE
Special Requests: ADEQUATE

## 2019-06-10 DIAGNOSIS — D509 Iron deficiency anemia, unspecified: Secondary | ICD-10-CM | POA: Diagnosis not present

## 2019-06-10 DIAGNOSIS — I10 Essential (primary) hypertension: Secondary | ICD-10-CM | POA: Diagnosis not present

## 2019-06-10 DIAGNOSIS — Z933 Colostomy status: Secondary | ICD-10-CM | POA: Diagnosis not present

## 2019-06-10 DIAGNOSIS — G8220 Paraplegia, unspecified: Secondary | ICD-10-CM | POA: Diagnosis not present

## 2019-06-10 DIAGNOSIS — S24104S Unspecified injury at T11-T12 level of thoracic spinal cord, sequela: Secondary | ICD-10-CM | POA: Diagnosis not present

## 2019-06-10 DIAGNOSIS — J309 Allergic rhinitis, unspecified: Secondary | ICD-10-CM | POA: Diagnosis not present

## 2019-06-10 DIAGNOSIS — L89154 Pressure ulcer of sacral region, stage 4: Secondary | ICD-10-CM | POA: Diagnosis not present

## 2019-06-10 DIAGNOSIS — N319 Neuromuscular dysfunction of bladder, unspecified: Secondary | ICD-10-CM | POA: Diagnosis not present

## 2019-06-10 DIAGNOSIS — L8915 Pressure ulcer of sacral region: Secondary | ICD-10-CM | POA: Diagnosis not present

## 2019-06-10 DIAGNOSIS — N400 Enlarged prostate without lower urinary tract symptoms: Secondary | ICD-10-CM | POA: Diagnosis not present

## 2019-06-12 DIAGNOSIS — L89154 Pressure ulcer of sacral region, stage 4: Secondary | ICD-10-CM | POA: Diagnosis not present

## 2019-06-12 DIAGNOSIS — S24104S Unspecified injury at T11-T12 level of thoracic spinal cord, sequela: Secondary | ICD-10-CM | POA: Diagnosis not present

## 2019-06-12 DIAGNOSIS — N319 Neuromuscular dysfunction of bladder, unspecified: Secondary | ICD-10-CM | POA: Diagnosis not present

## 2019-06-12 DIAGNOSIS — D509 Iron deficiency anemia, unspecified: Secondary | ICD-10-CM | POA: Diagnosis not present

## 2019-06-12 DIAGNOSIS — G8220 Paraplegia, unspecified: Secondary | ICD-10-CM | POA: Diagnosis not present

## 2019-06-12 DIAGNOSIS — J309 Allergic rhinitis, unspecified: Secondary | ICD-10-CM | POA: Diagnosis not present

## 2019-06-12 DIAGNOSIS — Z933 Colostomy status: Secondary | ICD-10-CM | POA: Diagnosis not present

## 2019-06-12 DIAGNOSIS — L8915 Pressure ulcer of sacral region: Secondary | ICD-10-CM | POA: Diagnosis not present

## 2019-06-12 DIAGNOSIS — N400 Enlarged prostate without lower urinary tract symptoms: Secondary | ICD-10-CM | POA: Diagnosis not present

## 2019-06-12 DIAGNOSIS — I10 Essential (primary) hypertension: Secondary | ICD-10-CM | POA: Diagnosis not present

## 2019-06-14 DIAGNOSIS — I10 Essential (primary) hypertension: Secondary | ICD-10-CM | POA: Diagnosis not present

## 2019-06-14 DIAGNOSIS — L89154 Pressure ulcer of sacral region, stage 4: Secondary | ICD-10-CM | POA: Diagnosis not present

## 2019-06-14 DIAGNOSIS — Z933 Colostomy status: Secondary | ICD-10-CM | POA: Diagnosis not present

## 2019-06-14 DIAGNOSIS — G8220 Paraplegia, unspecified: Secondary | ICD-10-CM | POA: Diagnosis not present

## 2019-06-14 DIAGNOSIS — L8915 Pressure ulcer of sacral region: Secondary | ICD-10-CM | POA: Diagnosis not present

## 2019-06-14 DIAGNOSIS — N400 Enlarged prostate without lower urinary tract symptoms: Secondary | ICD-10-CM | POA: Diagnosis not present

## 2019-06-14 DIAGNOSIS — N319 Neuromuscular dysfunction of bladder, unspecified: Secondary | ICD-10-CM | POA: Diagnosis not present

## 2019-06-14 DIAGNOSIS — S24104S Unspecified injury at T11-T12 level of thoracic spinal cord, sequela: Secondary | ICD-10-CM | POA: Diagnosis not present

## 2019-06-14 DIAGNOSIS — D509 Iron deficiency anemia, unspecified: Secondary | ICD-10-CM | POA: Diagnosis not present

## 2019-06-14 DIAGNOSIS — J309 Allergic rhinitis, unspecified: Secondary | ICD-10-CM | POA: Diagnosis not present

## 2019-06-17 DIAGNOSIS — Z933 Colostomy status: Secondary | ICD-10-CM | POA: Diagnosis not present

## 2019-06-17 DIAGNOSIS — S24104S Unspecified injury at T11-T12 level of thoracic spinal cord, sequela: Secondary | ICD-10-CM | POA: Diagnosis not present

## 2019-06-17 DIAGNOSIS — L89154 Pressure ulcer of sacral region, stage 4: Secondary | ICD-10-CM | POA: Diagnosis not present

## 2019-06-17 DIAGNOSIS — D509 Iron deficiency anemia, unspecified: Secondary | ICD-10-CM | POA: Diagnosis not present

## 2019-06-17 DIAGNOSIS — G8220 Paraplegia, unspecified: Secondary | ICD-10-CM | POA: Diagnosis not present

## 2019-06-17 DIAGNOSIS — J309 Allergic rhinitis, unspecified: Secondary | ICD-10-CM | POA: Diagnosis not present

## 2019-06-17 DIAGNOSIS — N400 Enlarged prostate without lower urinary tract symptoms: Secondary | ICD-10-CM | POA: Diagnosis not present

## 2019-06-17 DIAGNOSIS — I10 Essential (primary) hypertension: Secondary | ICD-10-CM | POA: Diagnosis not present

## 2019-06-17 DIAGNOSIS — N319 Neuromuscular dysfunction of bladder, unspecified: Secondary | ICD-10-CM | POA: Diagnosis not present

## 2019-06-19 DIAGNOSIS — G8220 Paraplegia, unspecified: Secondary | ICD-10-CM | POA: Diagnosis not present

## 2019-06-19 DIAGNOSIS — S24104S Unspecified injury at T11-T12 level of thoracic spinal cord, sequela: Secondary | ICD-10-CM | POA: Diagnosis not present

## 2019-06-19 DIAGNOSIS — N400 Enlarged prostate without lower urinary tract symptoms: Secondary | ICD-10-CM | POA: Diagnosis not present

## 2019-06-19 DIAGNOSIS — N319 Neuromuscular dysfunction of bladder, unspecified: Secondary | ICD-10-CM | POA: Diagnosis not present

## 2019-06-19 DIAGNOSIS — L89154 Pressure ulcer of sacral region, stage 4: Secondary | ICD-10-CM | POA: Diagnosis not present

## 2019-06-19 DIAGNOSIS — I10 Essential (primary) hypertension: Secondary | ICD-10-CM | POA: Diagnosis not present

## 2019-06-19 DIAGNOSIS — Z933 Colostomy status: Secondary | ICD-10-CM | POA: Diagnosis not present

## 2019-06-19 DIAGNOSIS — D509 Iron deficiency anemia, unspecified: Secondary | ICD-10-CM | POA: Diagnosis not present

## 2019-06-19 DIAGNOSIS — J309 Allergic rhinitis, unspecified: Secondary | ICD-10-CM | POA: Diagnosis not present

## 2019-06-20 DIAGNOSIS — M869 Osteomyelitis, unspecified: Secondary | ICD-10-CM | POA: Diagnosis not present

## 2019-06-20 DIAGNOSIS — L89154 Pressure ulcer of sacral region, stage 4: Secondary | ICD-10-CM | POA: Diagnosis not present

## 2019-06-20 DIAGNOSIS — G8220 Paraplegia, unspecified: Secondary | ICD-10-CM | POA: Diagnosis not present

## 2019-06-21 DIAGNOSIS — I10 Essential (primary) hypertension: Secondary | ICD-10-CM | POA: Diagnosis not present

## 2019-06-21 DIAGNOSIS — Z933 Colostomy status: Secondary | ICD-10-CM | POA: Diagnosis not present

## 2019-06-21 DIAGNOSIS — N319 Neuromuscular dysfunction of bladder, unspecified: Secondary | ICD-10-CM | POA: Diagnosis not present

## 2019-06-21 DIAGNOSIS — S24104S Unspecified injury at T11-T12 level of thoracic spinal cord, sequela: Secondary | ICD-10-CM | POA: Diagnosis not present

## 2019-06-21 DIAGNOSIS — N400 Enlarged prostate without lower urinary tract symptoms: Secondary | ICD-10-CM | POA: Diagnosis not present

## 2019-06-21 DIAGNOSIS — D509 Iron deficiency anemia, unspecified: Secondary | ICD-10-CM | POA: Diagnosis not present

## 2019-06-21 DIAGNOSIS — G8220 Paraplegia, unspecified: Secondary | ICD-10-CM | POA: Diagnosis not present

## 2019-06-21 DIAGNOSIS — J309 Allergic rhinitis, unspecified: Secondary | ICD-10-CM | POA: Diagnosis not present

## 2019-06-21 DIAGNOSIS — L89154 Pressure ulcer of sacral region, stage 4: Secondary | ICD-10-CM | POA: Diagnosis not present

## 2019-06-24 DIAGNOSIS — N400 Enlarged prostate without lower urinary tract symptoms: Secondary | ICD-10-CM | POA: Diagnosis not present

## 2019-06-24 DIAGNOSIS — L89154 Pressure ulcer of sacral region, stage 4: Secondary | ICD-10-CM | POA: Diagnosis not present

## 2019-06-24 DIAGNOSIS — N319 Neuromuscular dysfunction of bladder, unspecified: Secondary | ICD-10-CM | POA: Diagnosis not present

## 2019-06-24 DIAGNOSIS — G8220 Paraplegia, unspecified: Secondary | ICD-10-CM | POA: Diagnosis not present

## 2019-06-24 DIAGNOSIS — I10 Essential (primary) hypertension: Secondary | ICD-10-CM | POA: Diagnosis not present

## 2019-06-24 DIAGNOSIS — S24104S Unspecified injury at T11-T12 level of thoracic spinal cord, sequela: Secondary | ICD-10-CM | POA: Diagnosis not present

## 2019-06-24 DIAGNOSIS — D509 Iron deficiency anemia, unspecified: Secondary | ICD-10-CM | POA: Diagnosis not present

## 2019-06-24 DIAGNOSIS — Z933 Colostomy status: Secondary | ICD-10-CM | POA: Diagnosis not present

## 2019-06-24 DIAGNOSIS — J309 Allergic rhinitis, unspecified: Secondary | ICD-10-CM | POA: Diagnosis not present

## 2019-06-26 DIAGNOSIS — N400 Enlarged prostate without lower urinary tract symptoms: Secondary | ICD-10-CM | POA: Diagnosis not present

## 2019-06-26 DIAGNOSIS — D509 Iron deficiency anemia, unspecified: Secondary | ICD-10-CM | POA: Diagnosis not present

## 2019-06-26 DIAGNOSIS — S24104S Unspecified injury at T11-T12 level of thoracic spinal cord, sequela: Secondary | ICD-10-CM | POA: Diagnosis not present

## 2019-06-26 DIAGNOSIS — J309 Allergic rhinitis, unspecified: Secondary | ICD-10-CM | POA: Diagnosis not present

## 2019-06-26 DIAGNOSIS — I10 Essential (primary) hypertension: Secondary | ICD-10-CM | POA: Diagnosis not present

## 2019-06-26 DIAGNOSIS — N319 Neuromuscular dysfunction of bladder, unspecified: Secondary | ICD-10-CM | POA: Diagnosis not present

## 2019-06-26 DIAGNOSIS — Z933 Colostomy status: Secondary | ICD-10-CM | POA: Diagnosis not present

## 2019-06-26 DIAGNOSIS — G8220 Paraplegia, unspecified: Secondary | ICD-10-CM | POA: Diagnosis not present

## 2019-06-26 DIAGNOSIS — L89154 Pressure ulcer of sacral region, stage 4: Secondary | ICD-10-CM | POA: Diagnosis not present

## 2019-06-28 DIAGNOSIS — N319 Neuromuscular dysfunction of bladder, unspecified: Secondary | ICD-10-CM | POA: Diagnosis not present

## 2019-06-28 DIAGNOSIS — S24104S Unspecified injury at T11-T12 level of thoracic spinal cord, sequela: Secondary | ICD-10-CM | POA: Diagnosis not present

## 2019-06-28 DIAGNOSIS — J309 Allergic rhinitis, unspecified: Secondary | ICD-10-CM | POA: Diagnosis not present

## 2019-06-28 DIAGNOSIS — D509 Iron deficiency anemia, unspecified: Secondary | ICD-10-CM | POA: Diagnosis not present

## 2019-06-28 DIAGNOSIS — G8220 Paraplegia, unspecified: Secondary | ICD-10-CM | POA: Diagnosis not present

## 2019-06-28 DIAGNOSIS — L89154 Pressure ulcer of sacral region, stage 4: Secondary | ICD-10-CM | POA: Diagnosis not present

## 2019-06-28 DIAGNOSIS — I10 Essential (primary) hypertension: Secondary | ICD-10-CM | POA: Diagnosis not present

## 2019-06-28 DIAGNOSIS — Z933 Colostomy status: Secondary | ICD-10-CM | POA: Diagnosis not present

## 2019-06-28 DIAGNOSIS — N400 Enlarged prostate without lower urinary tract symptoms: Secondary | ICD-10-CM | POA: Diagnosis not present

## 2019-07-01 DIAGNOSIS — L89154 Pressure ulcer of sacral region, stage 4: Secondary | ICD-10-CM | POA: Diagnosis not present

## 2019-07-01 DIAGNOSIS — N319 Neuromuscular dysfunction of bladder, unspecified: Secondary | ICD-10-CM | POA: Diagnosis not present

## 2019-07-01 DIAGNOSIS — J309 Allergic rhinitis, unspecified: Secondary | ICD-10-CM | POA: Diagnosis not present

## 2019-07-01 DIAGNOSIS — Z933 Colostomy status: Secondary | ICD-10-CM | POA: Diagnosis not present

## 2019-07-01 DIAGNOSIS — G8220 Paraplegia, unspecified: Secondary | ICD-10-CM | POA: Diagnosis not present

## 2019-07-01 DIAGNOSIS — D509 Iron deficiency anemia, unspecified: Secondary | ICD-10-CM | POA: Diagnosis not present

## 2019-07-01 DIAGNOSIS — N400 Enlarged prostate without lower urinary tract symptoms: Secondary | ICD-10-CM | POA: Diagnosis not present

## 2019-07-01 DIAGNOSIS — I10 Essential (primary) hypertension: Secondary | ICD-10-CM | POA: Diagnosis not present

## 2019-07-01 DIAGNOSIS — S24104S Unspecified injury at T11-T12 level of thoracic spinal cord, sequela: Secondary | ICD-10-CM | POA: Diagnosis not present

## 2019-07-03 DIAGNOSIS — G8220 Paraplegia, unspecified: Secondary | ICD-10-CM | POA: Diagnosis not present

## 2019-07-03 DIAGNOSIS — N400 Enlarged prostate without lower urinary tract symptoms: Secondary | ICD-10-CM | POA: Diagnosis not present

## 2019-07-03 DIAGNOSIS — D509 Iron deficiency anemia, unspecified: Secondary | ICD-10-CM | POA: Diagnosis not present

## 2019-07-03 DIAGNOSIS — N319 Neuromuscular dysfunction of bladder, unspecified: Secondary | ICD-10-CM | POA: Diagnosis not present

## 2019-07-03 DIAGNOSIS — Z933 Colostomy status: Secondary | ICD-10-CM | POA: Diagnosis not present

## 2019-07-03 DIAGNOSIS — I10 Essential (primary) hypertension: Secondary | ICD-10-CM | POA: Diagnosis not present

## 2019-07-03 DIAGNOSIS — L89154 Pressure ulcer of sacral region, stage 4: Secondary | ICD-10-CM | POA: Diagnosis not present

## 2019-07-03 DIAGNOSIS — J309 Allergic rhinitis, unspecified: Secondary | ICD-10-CM | POA: Diagnosis not present

## 2019-07-03 DIAGNOSIS — S24104S Unspecified injury at T11-T12 level of thoracic spinal cord, sequela: Secondary | ICD-10-CM | POA: Diagnosis not present

## 2019-07-05 DIAGNOSIS — S24104S Unspecified injury at T11-T12 level of thoracic spinal cord, sequela: Secondary | ICD-10-CM | POA: Diagnosis not present

## 2019-07-05 DIAGNOSIS — Z933 Colostomy status: Secondary | ICD-10-CM | POA: Diagnosis not present

## 2019-07-05 DIAGNOSIS — N400 Enlarged prostate without lower urinary tract symptoms: Secondary | ICD-10-CM | POA: Diagnosis not present

## 2019-07-05 DIAGNOSIS — D509 Iron deficiency anemia, unspecified: Secondary | ICD-10-CM | POA: Diagnosis not present

## 2019-07-05 DIAGNOSIS — N319 Neuromuscular dysfunction of bladder, unspecified: Secondary | ICD-10-CM | POA: Diagnosis not present

## 2019-07-05 DIAGNOSIS — I10 Essential (primary) hypertension: Secondary | ICD-10-CM | POA: Diagnosis not present

## 2019-07-05 DIAGNOSIS — J309 Allergic rhinitis, unspecified: Secondary | ICD-10-CM | POA: Diagnosis not present

## 2019-07-05 DIAGNOSIS — G8220 Paraplegia, unspecified: Secondary | ICD-10-CM | POA: Diagnosis not present

## 2019-07-05 DIAGNOSIS — L89154 Pressure ulcer of sacral region, stage 4: Secondary | ICD-10-CM | POA: Diagnosis not present

## 2019-07-08 DIAGNOSIS — N400 Enlarged prostate without lower urinary tract symptoms: Secondary | ICD-10-CM | POA: Diagnosis not present

## 2019-07-08 DIAGNOSIS — J309 Allergic rhinitis, unspecified: Secondary | ICD-10-CM | POA: Diagnosis not present

## 2019-07-08 DIAGNOSIS — D509 Iron deficiency anemia, unspecified: Secondary | ICD-10-CM | POA: Diagnosis not present

## 2019-07-08 DIAGNOSIS — L89154 Pressure ulcer of sacral region, stage 4: Secondary | ICD-10-CM | POA: Diagnosis not present

## 2019-07-08 DIAGNOSIS — N319 Neuromuscular dysfunction of bladder, unspecified: Secondary | ICD-10-CM | POA: Diagnosis not present

## 2019-07-08 DIAGNOSIS — I10 Essential (primary) hypertension: Secondary | ICD-10-CM | POA: Diagnosis not present

## 2019-07-08 DIAGNOSIS — S24104S Unspecified injury at T11-T12 level of thoracic spinal cord, sequela: Secondary | ICD-10-CM | POA: Diagnosis not present

## 2019-07-08 DIAGNOSIS — G8220 Paraplegia, unspecified: Secondary | ICD-10-CM | POA: Diagnosis not present

## 2019-07-08 DIAGNOSIS — Z933 Colostomy status: Secondary | ICD-10-CM | POA: Diagnosis not present

## 2019-07-10 DIAGNOSIS — Z933 Colostomy status: Secondary | ICD-10-CM | POA: Diagnosis not present

## 2019-07-10 DIAGNOSIS — J309 Allergic rhinitis, unspecified: Secondary | ICD-10-CM | POA: Diagnosis not present

## 2019-07-10 DIAGNOSIS — L89154 Pressure ulcer of sacral region, stage 4: Secondary | ICD-10-CM | POA: Diagnosis not present

## 2019-07-10 DIAGNOSIS — S24104S Unspecified injury at T11-T12 level of thoracic spinal cord, sequela: Secondary | ICD-10-CM | POA: Diagnosis not present

## 2019-07-10 DIAGNOSIS — G8220 Paraplegia, unspecified: Secondary | ICD-10-CM | POA: Diagnosis not present

## 2019-07-10 DIAGNOSIS — D509 Iron deficiency anemia, unspecified: Secondary | ICD-10-CM | POA: Diagnosis not present

## 2019-07-10 DIAGNOSIS — N400 Enlarged prostate without lower urinary tract symptoms: Secondary | ICD-10-CM | POA: Diagnosis not present

## 2019-07-10 DIAGNOSIS — N319 Neuromuscular dysfunction of bladder, unspecified: Secondary | ICD-10-CM | POA: Diagnosis not present

## 2019-07-10 DIAGNOSIS — I10 Essential (primary) hypertension: Secondary | ICD-10-CM | POA: Diagnosis not present

## 2019-07-12 DIAGNOSIS — D509 Iron deficiency anemia, unspecified: Secondary | ICD-10-CM | POA: Diagnosis not present

## 2019-07-12 DIAGNOSIS — J309 Allergic rhinitis, unspecified: Secondary | ICD-10-CM | POA: Diagnosis not present

## 2019-07-12 DIAGNOSIS — I10 Essential (primary) hypertension: Secondary | ICD-10-CM | POA: Diagnosis not present

## 2019-07-12 DIAGNOSIS — N319 Neuromuscular dysfunction of bladder, unspecified: Secondary | ICD-10-CM | POA: Diagnosis not present

## 2019-07-12 DIAGNOSIS — S24104S Unspecified injury at T11-T12 level of thoracic spinal cord, sequela: Secondary | ICD-10-CM | POA: Diagnosis not present

## 2019-07-12 DIAGNOSIS — G8220 Paraplegia, unspecified: Secondary | ICD-10-CM | POA: Diagnosis not present

## 2019-07-12 DIAGNOSIS — Z933 Colostomy status: Secondary | ICD-10-CM | POA: Diagnosis not present

## 2019-07-12 DIAGNOSIS — L89154 Pressure ulcer of sacral region, stage 4: Secondary | ICD-10-CM | POA: Diagnosis not present

## 2019-07-12 DIAGNOSIS — N400 Enlarged prostate without lower urinary tract symptoms: Secondary | ICD-10-CM | POA: Diagnosis not present

## 2019-07-15 DIAGNOSIS — G8220 Paraplegia, unspecified: Secondary | ICD-10-CM | POA: Diagnosis not present

## 2019-07-15 DIAGNOSIS — L89154 Pressure ulcer of sacral region, stage 4: Secondary | ICD-10-CM | POA: Diagnosis not present

## 2019-07-15 DIAGNOSIS — S24104S Unspecified injury at T11-T12 level of thoracic spinal cord, sequela: Secondary | ICD-10-CM | POA: Diagnosis not present

## 2019-07-15 DIAGNOSIS — Z933 Colostomy status: Secondary | ICD-10-CM | POA: Diagnosis not present

## 2019-07-15 DIAGNOSIS — I10 Essential (primary) hypertension: Secondary | ICD-10-CM | POA: Diagnosis not present

## 2019-07-15 DIAGNOSIS — J309 Allergic rhinitis, unspecified: Secondary | ICD-10-CM | POA: Diagnosis not present

## 2019-07-15 DIAGNOSIS — N400 Enlarged prostate without lower urinary tract symptoms: Secondary | ICD-10-CM | POA: Diagnosis not present

## 2019-07-15 DIAGNOSIS — N319 Neuromuscular dysfunction of bladder, unspecified: Secondary | ICD-10-CM | POA: Diagnosis not present

## 2019-07-15 DIAGNOSIS — D509 Iron deficiency anemia, unspecified: Secondary | ICD-10-CM | POA: Diagnosis not present

## 2019-07-16 ENCOUNTER — Other Ambulatory Visit: Payer: Self-pay

## 2019-07-16 ENCOUNTER — Encounter: Payer: Medicare HMO | Attending: Physician Assistant | Admitting: Physician Assistant

## 2019-07-16 DIAGNOSIS — G8220 Paraplegia, unspecified: Secondary | ICD-10-CM | POA: Diagnosis not present

## 2019-07-16 DIAGNOSIS — Z8249 Family history of ischemic heart disease and other diseases of the circulatory system: Secondary | ICD-10-CM | POA: Diagnosis not present

## 2019-07-16 DIAGNOSIS — L98422 Non-pressure chronic ulcer of back with fat layer exposed: Secondary | ICD-10-CM | POA: Diagnosis not present

## 2019-07-16 DIAGNOSIS — M069 Rheumatoid arthritis, unspecified: Secondary | ICD-10-CM | POA: Diagnosis not present

## 2019-07-16 DIAGNOSIS — I10 Essential (primary) hypertension: Secondary | ICD-10-CM | POA: Insufficient documentation

## 2019-07-16 DIAGNOSIS — L89154 Pressure ulcer of sacral region, stage 4: Secondary | ICD-10-CM | POA: Diagnosis not present

## 2019-07-16 DIAGNOSIS — I252 Old myocardial infarction: Secondary | ICD-10-CM | POA: Insufficient documentation

## 2019-07-16 DIAGNOSIS — D509 Iron deficiency anemia, unspecified: Secondary | ICD-10-CM | POA: Insufficient documentation

## 2019-07-16 DIAGNOSIS — Z87891 Personal history of nicotine dependence: Secondary | ICD-10-CM | POA: Insufficient documentation

## 2019-07-16 DIAGNOSIS — Z809 Family history of malignant neoplasm, unspecified: Secondary | ICD-10-CM | POA: Insufficient documentation

## 2019-07-16 DIAGNOSIS — L89153 Pressure ulcer of sacral region, stage 3: Secondary | ICD-10-CM | POA: Insufficient documentation

## 2019-07-16 DIAGNOSIS — Z23 Encounter for immunization: Secondary | ICD-10-CM | POA: Diagnosis not present

## 2019-07-16 DIAGNOSIS — Z933 Colostomy status: Secondary | ICD-10-CM | POA: Insufficient documentation

## 2019-07-17 DIAGNOSIS — I10 Essential (primary) hypertension: Secondary | ICD-10-CM | POA: Diagnosis not present

## 2019-07-17 DIAGNOSIS — S24104S Unspecified injury at T11-T12 level of thoracic spinal cord, sequela: Secondary | ICD-10-CM | POA: Diagnosis not present

## 2019-07-17 DIAGNOSIS — L89154 Pressure ulcer of sacral region, stage 4: Secondary | ICD-10-CM | POA: Diagnosis not present

## 2019-07-17 DIAGNOSIS — J309 Allergic rhinitis, unspecified: Secondary | ICD-10-CM | POA: Diagnosis not present

## 2019-07-17 DIAGNOSIS — D509 Iron deficiency anemia, unspecified: Secondary | ICD-10-CM | POA: Diagnosis not present

## 2019-07-17 DIAGNOSIS — G8220 Paraplegia, unspecified: Secondary | ICD-10-CM | POA: Diagnosis not present

## 2019-07-17 DIAGNOSIS — N319 Neuromuscular dysfunction of bladder, unspecified: Secondary | ICD-10-CM | POA: Diagnosis not present

## 2019-07-17 DIAGNOSIS — Z933 Colostomy status: Secondary | ICD-10-CM | POA: Diagnosis not present

## 2019-07-17 DIAGNOSIS — N400 Enlarged prostate without lower urinary tract symptoms: Secondary | ICD-10-CM | POA: Diagnosis not present

## 2019-07-17 NOTE — Progress Notes (Signed)
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 that setting **** READMISSION 09/21/17-He is here for evaluation of stage IV sacral ulcer. Since his last evaluation here in December he has completed treatment for sacral osteomyelitis. He was at [facility] for IV therapy and NPWT dressing changes. He was discharged, with home health services, in February. He admits that while in the skilled facility he had "80%" success with maintaining dressing, since discharge he has had approximately "40%" success with maintaining wound VAC dressing. We discussed at length that this is not a safe or recommended option. We will apply Dakin's wet to dry dressing daily and he will follow-up next week. He is accompanied today by his sister who is willing to assist in  dressing changes; they will discuss the social issue as he feels he is capable of changing dressing daily when home health is not able. 09/28/17-He is here in follow-up evaluation for stage IV sacral pressure ulcer. He has been using the Dakin's wet-to-dry daily; he continues with home health. He is not accompanied by anyone at this visit. He will follow up in two [name] per his request/preference. 10/12/17 on evaluation today patient appears to be doing very well. The Dakin solution went to dry packings do seem to be helping him as far as the sacral wound is concerned I'm not seeing anything that has me more concerned as far as infection or otherwise is concerned. Overall I'm pleased with the appearance of the wound. 10/26/17-He is here in follow up evaluation for a stage IV sacral ulcer. He continues with daily Dakin's wet-to-dry. He is voicing no complaints or concerns. He will follow-up in 2 [name] 11/16/17-He is here for follow up evaluation for a palliative stage IV sacral pressure ulcer. We will continue with Dakin's wet-to- dry. He will follow-up in 4 [name]. He is expressing concern/complaint regarding new bed that has arrived, stating he is unable to manipulate/maneuver it due to the bed crank being at the foot of the bed. 12/14/17-He  is here for evaluation for palliative stage IV sacral ulcer. He is voicing no complaints or concerns. We will continue with one-to-one ratio of saline and dakins. He will follow-up in 3 [name] 01/04/18-He is here in follow-up evaluation for palliative stage IV sacral ulcer. He is inquiring about reinstating the negative pressure wound therapy. We discussed at length that the negative pressure wound therapy in the home setting has not been successful for him repeatedly with loss of cereal and unavailability of 24/7 help; reminded him that home health is not available 24/7 when loss of seal occurs. He does verbalize understanding to this and does not pursue. We also  discussed the palliative nature of this ulcer (given no significant change/improvement in measurement/appearance, not a candidate for muscle flap per plastic surgery, and continued independent living) and that the goal is for maintenance, decrease in infection and minimizing/avoiding deterioration given that he is independent in his care, does not have home health and requires daily dressing changes secondary to drainage amount. He is inquiring about a wound clinic in [facility], I have informed him that I am unfamiliar with that clinic but that he is encouraged to seek another opinion if that is his desire. We will continue with dakins and he will follow up in three [name] 01/25/18-He is here in follow-up evaluation for palliative stage IV sacral ulcer. He continues with Dakin's/saline 1:1 mixture wet to dry dressing changes. He states he has an appointment at [address] [facility] on 9/17 for evaluation of surgical intervention/closure of the sacral ulcer. He will follow-up here in 4 [name] Readmission: 07/16/2019 upon evaluation today patient appears to be doing really about the same as when he was previously seen here in the wound care center. He most recently was a patient of [facility] back when she was still working here in the center but had been referred to [facility] for consideration of a flap. With that being said the surgeon there at [facility] stated that this was too large for her flap and they have been attempting to get this smaller in order to be able to proceed with a flap. Nonetheless unfortunately he has had a cycle of going back and forth between the osteomyelitis flaring and then sent him back and then making a little bit of progress only to be sent back again. It sounds like most recently they have been using a Iodoflex type dressing at this point which does not seem to have done any harm by any means. With that being said this wound seems to KASEM, HAFELE. ([id number]) be quite large for using Iodoflex throughout and  subsequently I think he may do much better with the use of Vioxx moistened gauze which would be safe for the new tissue growing and also keep the wound quite nice and clean. The patient is not opposed to this he in fact states that his home health nurse had mentioned this as a possibility as well. Patient History Information obtained from Patient. Allergies No Known Allergies Family History Cancer - Mother, Heart Disease - Mother,Father, Seizures - Child, No family history of Diabetes, Hereditary Spherocytosis, Hypertension, Kidney Disease, Lung Disease, Stroke, Thyroid Problems, Tuberculosis. Social History Former smoker, Marital Status - Single, Alcohol Use - Never, Drug Use - No History, Caffeine Use - Moderate - coffee. Medical History Eyes Denies history of Cataracts, Glaucoma, Optic Neuritis Hematologic/Lymphatic Patient has history of Anemia Denies history of Hemophilia, Human Immunodeficiency Virus, Lymphedema, Sickle Cell Disease Respiratory Denies history of Aspiration, Asthma, Chronic Obstructive Pulmonary Disease (  COPD), Pneumothorax, Sleep Apnea, Tuberculosis Cardiovascular Patient has history of Hypertension Denies history of Angina, Arrhythmia, Congestive Heart Failure, Coronary Artery Disease, Deep Vein Thrombosis, Hypotension, Myocardial Infarction, Peripheral Arterial Disease, Peripheral Venous Disease, Phlebitis, Vasculitis Gastrointestinal Denies history of Cirrhosis , Colitis, Crohn s, Hepatitis A, Hepatitis B, Hepatitis C Endocrine Denies history of Type I Diabetes, Type II Diabetes Genitourinary Denies history of End Stage Renal Disease Immunological Denies history of Lupus Erythematosus, Raynaud s, Scleroderma Integumentary (Skin) Patient has history of History of pressure wounds Musculoskeletal Patient has history of Rheumatoid Arthritis Denies history of Gout, Osteoarthritis, Osteomyelitis Neurologic Patient has history of Paraplegia Denies history  of Dementia, Neuropathy, Quadriplegia, Seizure Disorder Oncologic Denies history of Received Chemotherapy, Received Radiation Psychiatric Denies history of Anorexia/bulimia, Confinement Anxiety Hospitalization/Surgery History - December 2020 [facility]. Medical And Surgical History Notes Gastrointestinal colostomy Genitourinary CLABORN, KROLCZYK ([id number]) frequent urination Review of Systems (ROS) Eyes Denies complaints or symptoms of Dry Eyes, Vision Changes, Glasses / Contacts. Ear/Nose/Mouth/Throat Denies complaints or symptoms of Difficult clearing ears, Sinusitis. Hematologic/Lymphatic Denies complaints or symptoms of Bleeding / Clotting Disorders, Human Immunodeficiency Virus. Respiratory Denies complaints or symptoms of Chronic or frequent coughs, Shortness of Breath. Cardiovascular Denies complaints or symptoms of Chest pain, LE edema. Gastrointestinal Denies complaints or symptoms of Frequent diarrhea, Nausea, Vomiting. Endocrine Denies complaints or symptoms of Hepatitis, Thyroid disease, Polydypsia (Excessive Thirst). Genitourinary Denies complaints or symptoms of Kidney failure/ Dialysis, Incontinence/dribbling. Immunological Denies complaints or symptoms of Hives, Itching. Integumentary (Skin) Complains or has symptoms of Wounds. Denies complaints or symptoms of Bleeding or bruising tendency, Breakdown, Swelling. Musculoskeletal Denies complaints or symptoms of Muscle Pain, Muscle Weakness. Neurologic Denies complaints or symptoms of Numbness/parasthesias, Focal/Weakness. Psychiatric Denies complaints or symptoms of Anxiety, Claustrophobia. Objective Constitutional sitting or standing blood pressure is within target range for patient.. pulse regular and within target range for patient.Marland Kitchen respirations regular, non-labored and within target range for patient.Marland Kitchen temperature within target range for patient.. Well- nourished and well-hydrated in no acute distress. Vitals  Time Taken: 9:30 AM, Temperature: 97.9 F, Pulse: 78 bpm, Respiratory Rate: 16 breaths/min, Blood Pressure: 126/59 mmHg. Eyes conjunctiva clear no eyelid edema noted. pupils equal round and reactive to light and accommodation. Ears, Nose, Mouth, and Throat no gross abnormality of ear auricles or external auditory canals. normal hearing noted during conversation. mucus membranes moist. Respiratory normal breathing without difficulty. Bradley [name], NICCUM ([id number]) Gastrointestinal (GI) soft, non-tender, non-distended, +BS. no ventral hernia noted. Musculoskeletal Patient unable to walk Secondary to paraplegia. Psychiatric this patient is able to make decisions and demonstrates good insight into disease process. Alert and Oriented x 3. pleasant and cooperative. General Notes: Patient's wound bed currently showed signs of good granulation in my opinion that appears to have a very good wound surface and overall and pleased in that regard. I do not see any signs of active infection there is no bone exposure and overall I feel like he is really doing quite well all things considered. He does state that he has had no recent discomfort. Integumentary (Hair, Skin) Wound #10 status is Open. Original cause of wound was Pressure Injury. The wound is located on the Midline Sacrum. The wound measures 8.2cm length x 8cm width x 6cm depth; 51.522cm^2 area and 309.133cm^3 volume. There is Fat Layer (Subcutaneous Tissue) Exposed exposed. There is no tunneling or undermining noted. There is a large amount of serosanguineous drainage noted. The wound margin is distinct with the outline attached to the wound base. There is large (67-100%)  red granulation within the wound bed. There is a small (1-33%) amount of necrotic tissue within the wound bed including Adherent Slough. Assessment Active Problems ICD-10 Pressure ulcer of sacral region, stage 4 Other chronic osteomyelitis, other site Paraplegia,  unspecified Plan Wound Cleansing: Wound #10 Midline Sacrum: Clean wound with Normal Saline. Dial antibacterial soap, wash wounds, rinse and pat dry prior to dressing wounds Primary Wound Dressing: Wound #10 Midline Sacrum: Other: - Vashe moistened gauze Secondary Dressing: Wound #10 Midline Sacrum: ABD pad - secure with tape XtraSorb Dressing Change Frequency: Wound #10 Midline Sacrum: Change dressing every day. - Patient's son to change when [facility] does not visit Bradley [name], Bradley [name] ([id number]) Follow-up Appointments: Return Appointment in 1 week. Off-Loading: Wound #10 Midline Sacrum: Gel wheelchair cushion Turn and reposition every 2 hours Home Health: Wound #10 Midline Sacrum: [place] Nurse may visit PRN to address patient s wound care needs. FACE TO FACE ENCOUNTER: MEDICARE and MEDICAID PATIENTS: I certify that this patient is under my care and that I had a face-to-face encounter that meets the physician face-to-face encounter requirements with this patient on this date. The encounter with the patient was in whole or in part for the following MEDICAL CONDITION: (primary reason for [place]) MEDICAL NECESSITY: I certify, that based on my findings, NURSING services are a medically necessary home health service. HOME BOUND STATUS: I certify that my clinical findings support that this patient is homebound (i.e., Due to illness or injury, pt requires aid of supportive devices such as crutches, cane, wheelchairs, walkers, the use of special transportation or the assistance of another person to leave their place of residence. There is a normal inability to leave the home and doing so requires considerable and taxing effort. Other absences are for medical reasons / religious services and are infrequent or of short duration when for other reasons). If current dressing causes regression in wound condition, may D/C ordered dressing product/s and apply  Normal Saline Moist Dressing daily until next [place] / Other MD appointment. [place] of regression in wound condition at [phone]. Please direct any NON-WOUND related issues/requests for orders to patient's Primary Care Physician 1. My suggestion at this time is good to be that we go ahead and initiate a treatment with Vioxx moistened gauze packed into the wound bed that should be changed daily in my opinion home health come out 3 times a week and his son will help with the other days of the week. 2. I am also going to suggest that the patient needs to appropriately offload he admits he probably sits in his chair for far too long he needs to try to get up and move around on a regular basis in my opinion. 3. I am also going to recommend at this point that when the patient is in his bed that he also needs to make sure he is laying on inside at times so that he is not just on his backside which would in turn put more pressure on the sacral region causing the wound to worsen. 4. We will continue to monitor for any signs of active infection although I do not see anything at this point. We will see patient back for reevaluation in 1 week here in the clinic. If anything worsens or changes patient will contact our office for additional recommendations. Electronic Signature(s) Signed: 07/16/2019 2:31:13 PM By: Worthy Keeler PA-C Previous Signature: 07/16/2019 10:49:25 AM Version By: Bradley [name],  Caterina Racine PA-C Entered By: Worthy Keeler on 07/16/2019 14:31:12 Bradley [name] ([id number]) -------------------------------------------------------------------------------- ROS/PFSH Details Patient Name: Bradley [name], Bradley A. Date of Service: 07/16/2019 9:45 AM Medical Record Number: [id number] Patient Account Number: [id number] Date of Birth/Sex: 21-May-1946 (74 y.o. M) Treating RN: Army Melia Primary Care Provider: Dion [name] Other Clinician: Referring Provider: Lizabeth [name] Treating Provider/Extender: Bradley [name], Loribeth Katich [name] in Treatment: 0 Label Progress Note Print Version as History and Physical for this encounter Information Obtained From Patient Eyes Complaints and Symptoms: Negative for: Dry Eyes; Vision Changes; Glasses / Contacts Medical History: Negative for: Cataracts; Glaucoma; Optic Neuritis Ear/Nose/Mouth/Throat Complaints and Symptoms: Negative for: Difficult clearing ears; Sinusitis Hematologic/Lymphatic Complaints and Symptoms: Negative for: Bleeding / Clotting Disorders; Human Immunodeficiency Virus Medical History: Positive for: Anemia Negative for: Hemophilia; Human Immunodeficiency Virus; Lymphedema; Sickle Cell Disease Respiratory Complaints and Symptoms: Negative for: Chronic or frequent coughs; Shortness of Breath Medical History: Negative for: Aspiration; Asthma; Chronic Obstructive Pulmonary Disease (COPD); Pneumothorax; Sleep Apnea; Tuberculosis Cardiovascular Complaints and Symptoms: Negative for: Chest pain; LE edema Medical History: Positive for: Hypertension Negative for: Angina; Arrhythmia; Congestive Heart Failure; Coronary Artery Disease; Deep Vein Thrombosis; Hypotension; Myocardial Infarction; Peripheral Arterial Disease; Peripheral Venous Disease; Phlebitis; Vasculitis Gastrointestinal Complaints and Symptoms: Negative for: Frequent diarrhea; Nausea; Vomiting Medical History: Bradley [name], Bradley [name] ([id number]) Negative for: Cirrhosis ; Colitis; Crohnos; Hepatitis A; Hepatitis B; Hepatitis C Past Medical History Notes: colostomy Endocrine Complaints and Symptoms: Negative for: Hepatitis; Thyroid disease; Polydypsia (Excessive Thirst) Medical History: Negative for: Type I Diabetes; Type II Diabetes Genitourinary Complaints and Symptoms: Negative for: Kidney failure/ Dialysis; Incontinence/dribbling Medical History: Negative for: End Stage Renal Disease Past Medical History Notes: frequent  urination Immunological Complaints and Symptoms: Negative for: Hives; Itching Medical History: Negative for: Lupus Erythematosus; Raynaudos; Scleroderma Integumentary (Skin) Complaints and Symptoms: Positive for: Wounds Negative for: Bleeding or bruising tendency; Breakdown; Swelling Medical History: Positive for: History of pressure wounds Musculoskeletal Complaints and Symptoms: Negative for: Muscle Pain; Muscle Weakness Medical History: Positive for: Rheumatoid Arthritis Negative for: Gout; Osteoarthritis; Osteomyelitis Neurologic Complaints and Symptoms: Negative for: Numbness/parasthesias; Focal/Weakness Medical History: Positive for: Paraplegia Negative for: Dementia; Neuropathy; Quadriplegia; Seizure Disorder Psychiatric Bradley [name], Bradley [name] ([id number]) Complaints and Symptoms: Negative for: Anxiety; Claustrophobia Medical History: Negative for: Anorexia/bulimia; Confinement Anxiety Oncologic Medical History: Negative for: Received Chemotherapy; Received Radiation Immunizations Pneumococcal Vaccine: Received Pneumococcal Vaccination: No Immunization Notes: up to date Implantable Devices None Hospitalization / Surgery History Type of Hospitalization/Surgery December 2020 [facility] Family and Social History Cancer: Yes - Mother; Diabetes: No; Heart Disease: Yes - Mother,Father; Hereditary Spherocytosis: No; Hypertension: No; Kidney Disease: No; Lung Disease: No; Seizures: Yes - Child; Stroke: No; Thyroid Problems: No; Tuberculosis: No; Former smoker; Marital Status - Single; Alcohol Use: Never; Drug Use: No History; Caffeine Use: Moderate - coffee; Financial Concerns: No; Food, Clothing or Shelter Needs: No; Support System Lacking: No; Transportation Concerns: No Electronic Signature(s) Signed: 07/16/2019 11:40:44 AM By: Worthy Keeler PA-C Signed: 07/16/2019 4:13:47 PM By: Bradley [name] Signed: 07/17/2019 11:18:27 AM By: Army Melia Entered By: Bradley [name] on  07/16/2019 10:10:27 Bradley [name] ([id number]) -------------------------------------------------------------------------------- SuperBill Details Patient Name: DEAGEN, GAUNA A. Date of Service: 07/16/2019 Medical Record Number: [id number] Patient Account Number: [id number] Date of Birth/Sex: [birth year] (74 y.o. M) Treating RN: Bradley [name] Primary Care Provider: Dion [name] Other Clinician: Referring Provider: Lizabeth [name] Treating Provider/Extender: Bradley [name], Anamae Rochelle [name] in Treatment: 0 Diagnosis Coding ICD-10 Codes Code Description L89.154 Pressure ulcer of sacral region, stage 4 M86.68 Other chronic  osteomyelitis, other site G82.20 Paraplegia, unspecified Facility Procedures CPT4 Code: [id number] Description: 99214 - WOUND CARE VISIT-LEV 4 EST PT Modifier: Quantity: 1 Physician Procedures CPT4 Code: [id number] Description: [id number] - WC PHYS LEVEL 4 - EST PT ICD-10 Diagnosis Description L89.154 Pressure ulcer of sacral region, stage 4 M86.68 Other chronic osteomyelitis, other site G82.20 Paraplegia, unspecified Modifier: Quantity: 1 Electronic Signature(s) Signed: 07/16/2019 10:49:40 AM By: Worthy Keeler PA-C Entered By: Worthy Keeler on 07/16/2019 10:49:39

## 2019-07-17 NOTE — Progress Notes (Signed)
GEOGGREY, GRESH ([id number]) Visit Report for 07/16/2019 Abuse/Suicide Risk Screen Details Patient Name: Bradley [name], Bradley [name]. Date of Service: 07/16/2019 9:45 AM Medical Record Number: [id number] Patient Account Number: [id number] Date of Birth/Sex: 1945/07/25 (74 y.o. M) Treating RN: Army Melia Primary Care Matthieu Loftus: [name] Other Clinician: Referring Joellen Tullos: [name] Treating Jamariyah Johannsen/Extender: STONE III, HOYT Weeks in Treatment: 0 Abuse/Suicide Risk Screen Items Answer ABUSE RISK SCREEN: Has anyone close to you tried to hurt or harm you recentlyo No Do you feel uncomfortable with anyone in your familyo No Has anyone forced you do things that you didnot want to doo No Electronic Signature(s) Signed: 07/17/2019 11:18:27 AM By: Army Melia Entered By: Army Melia on 07/16/2019 09:46:03 Bradley [name] ([id number]) -------------------------------------------------------------------------------- Activities of Daily Living Details Patient Name: [name], [name] Date of Service: 07/16/2019 9:45 AM Medical Record Number: [id number] Patient Account Number: [id number] Date of Birth/Sex: 16-Jul-1945 (74 y.o. M) Treating RN: Army Melia Primary Care Marvene Strohm: [name] Other Clinician: Referring Doyle Kunath: [name] Treating Shelia Magallon/Extender: [name], HOYT Weeks in Treatment: 0 Activities of Daily Living Items Answer Activities of Daily Living (Please select one for each item) Drive Automobile Not Able Take Medications Completely Able Use Telephone Completely Able Care for Appearance Completely Able Use Toilet Completely Able Bath / Shower Completely Able Dress Self Completely Able Feed Self Completely Able Walk Not Able Get In / Out Bed Completely Able Housework Need Assistance Prepare Meals Completely [place] for Self Completely Able Electronic Signature(s) Signed: 07/17/2019 11:18:27 AM By: Army Melia Entered By:  Army Melia on 07/16/2019 09:46:50 Bradley [name] ([id number]) -------------------------------------------------------------------------------- Education Screening Details Patient Name: Bradley Loa A. Date of Service: 07/16/2019 9:45 AM Medical Record Number: [id number] Patient Account Number: [id number] Date of Birth/Sex: [birth year] (74 y.o. M) Treating RN: Army Melia Primary Care Harsha Yusko: [name] Other Clinician: Referring Tasharra Nodine: [name] Treating Jase Himmelberger/Extender: [name], HOYT Weeks in Treatment: 0 Primary Learner Assessed: Patient Learning Preferences/Education Level/Primary Language Learning Preference: Explanation Highest Education Level: College or Above Preferred Language: English Cognitive Barrier Language Barrier: No Translator Needed: No Memory Deficit: No Emotional Barrier: No Cultural/Religious Beliefs Affecting Medical Care: No Physical Barrier Impaired Vision: No Impaired Hearing: No Decreased Hand dexterity: No Knowledge/Comprehension Knowledge Level: High Comprehension Level: High Ability to understand written High instructions: Motivation Anxiety Level: Calm Cooperation: Cooperative Education Importance: Acknowledges Need Interest in Health Problems: Asks Questions Perception: Coherent Willingness to Engage in Self- High Management Activities: Readiness to Engage in Self- High Management Activities: Electronic Signature(s) Signed: 07/17/2019 11:18:27 AM By: Army Melia Entered By: Army Melia on 07/16/2019 09:47:05 Bradley [name] ([id number]) -------------------------------------------------------------------------------- Fall Risk Assessment Details Patient Name: Bradley [name]. Date of Service: 07/16/2019 9:45 AM Medical Record Number: [id number] Patient Account Number: [id number] Date of Birth/Sex: 1945-09-29 (74 y.o. M) Treating RN: Army Melia Primary Care Sundae Maners: [name] Other  Clinician: Referring Lelynd Poer: [name] Treating Keelynn Furgerson/Extender: [name], HOYT Weeks in Treatment: 0 Fall Risk Assessment Items Have you had 2 or more falls in the last 12 monthso 0 No Have you had any fall that resulted in injury in the last 12 monthso 0 No FALLS RISK SCREEN History of falling - immediate or within 3 months 0 No Secondary diagnosis (Do you have 2 or more medical diagnoseso) 0 No Ambulatory aid None/bed rest/wheelchair/nurse 0 Yes Crutches/cane/walker 0 No Furniture 0 No Intravenous therapy Access/Saline/Heparin Lock 0 No Gait/Transferring Normal/ bed rest/ wheelchair 0 No Weak (short steps with or without shuffle,  stooped but able to lift head while 0 No walking, may seek support from furniture) Impaired (short steps with shuffle, may have difficulty arising from chair, head 0 No down, impaired balance) Mental Status Oriented to own ability 0 No Electronic Signature(s) Signed: 07/17/2019 11:18:27 AM By: Army Melia Entered By: Army Melia on 07/16/2019 09:47:16 Bradley [name] ([id number]) -------------------------------------------------------------------------------- Nutrition Risk Screening Details Patient Name: Bradley Loa A. Date of Service: 07/16/2019 9:45 AM Medical Record Number: [id number] Patient Account Number: [id number] Date of Birth/Sex: [birth year] (74 y.o. M) Treating RN: Army Melia Primary Care Juwan Vences: [name] Other Clinician: Referring Tyquez Hollibaugh: [name] Treating Euline Kimbler/Extender: STONE III, HOYT Weeks in Treatment: 0 Height (in): Weight (lbs): Body Mass Index (BMI): Nutrition Risk Screening Items Score Screening NUTRITION RISK SCREEN: I have an illness or condition that made me change the kind and/or amount of 0 No food I eat I eat fewer than two meals per day 0 No I eat few fruits and vegetables, or milk products 0 No I have three or more drinks of beer, liquor or wine almost every day 0 No I have  tooth or mouth problems that make it hard for me to eat 0 No I don't always have enough money to buy the food I need 0 No I eat alone most of the time 0 No I take three or more different prescribed or over-the-counter drugs a day 0 No Without wanting to, I have lost or gained 10 pounds in the last six months 0 No I am not always physically able to shop, cook and/or feed myself 0 No Nutrition Protocols Good Risk Protocol 0 No interventions needed Moderate Risk Protocol High Risk Proctocol Risk Level: Good Risk Score: 0 Electronic Signature(s) Signed: 07/17/2019 11:18:27 AM By: Army Melia Entered By: Army Melia on 07/16/2019 09:47:22

## 2019-07-17 NOTE — Progress Notes (Signed)
RAYMUND, BREITENBACH ([id number]) Visit Report for 07/16/2019 Allergy List Details Patient Name: Bradley [name], Bradley [name]. Date of Service: 07/16/2019 9:45 AM Medical Record Number: [id number] Patient Account Number: [id number] Date of Birth/Sex: 06-29-1945 (74 y.o. M) Treating RN: Army Melia Primary Care Alok Minshall: [name] Other Clinician: Referring Karsin Pesta: [name] Treating Makynzie Dobesh/Extender: STONE III, HOYT Weeks in Treatment: 0 Allergies Active Allergies No Known Allergies Allergy Notes Electronic Signature(s) Signed: 07/17/2019 11:18:27 AM By: Army Melia Entered By: Army Melia on 07/16/2019 09:44:01 [name] -------------------------------------------------------------------------------- Arrival Information Details Patient Name: Bradley Loa A. Date of Service: 07/16/2019 9:45 AM Medical Record Number: [id number] Patient Account Number: [id number] Date of Birth/Sex: 14-Apr-1946 (74 y.o. M) Treating RN: Montey Hora Primary Care Melanye Hiraldo: [name] Other Clinician: Referring Dyann Goodspeed: [name] Treating Nataniel Gasper/Extender: [name], HOYT Weeks in Treatment: 0 Visit Information Patient Arrived: Wheel Chair Arrival Time: 09:26 Accompanied By: self Transfer Assistance: [place] Patient Identification Verified: Yes Secondary Verification Process Completed: Yes History Since Last Visit Added or deleted any medications: No Any new allergies or adverse reactions: No Signs or symptoms of abuse/neglect since last visito No Hospitalized since last visit: No Implantable device outside of the clinic excluding cellular tissue based products placed in the center since last visit: No Electronic Signature(s) Signed: 07/16/2019 1:29:53 PM By: Lorine Bears RCP, RRT, CHT Entered By: Lorine Bears on 07/16/2019 09:32:15 [name]  ([id number]) -------------------------------------------------------------------------------- Clinic Level of Care Assessment Details Patient Name: Bradley Loa A. Date of Service: 07/16/2019 9:45 AM Medical Record Number: [id number] Patient Account Number: [id number] Date of Birth/Sex: 1946/03/23 (74 y.o. M) Treating RN: Montey Hora Primary Care Azarya Oconnell: [name] Other Clinician: Referring Georgine Wiltse: [name] Treating Clyda Smyth/Extender: [name], HOYT Weeks in Treatment: 0 Clinic Level of Care Assessment Items TOOL 2 Quantity Score []  - Use when only an EandM is performed on the INITIAL visit 0 ASSESSMENTS - Nursing Assessment / Reassessment X - General Physical Exam (combine w/ comprehensive assessment (listed just below) when 1 20 performed on new pt. evals) X- 1 25 Comprehensive Assessment (HX, ROS, Risk Assessments, Wounds Hx, etc.) ASSESSMENTS - Wound and Skin Assessment / Reassessment X - Simple Wound Assessment / Reassessment - one wound 1 5 []  - 0 Complex Wound Assessment / Reassessment - multiple wounds []  - 0 Dermatologic / Skin Assessment (not related to wound area) ASSESSMENTS - Ostomy and/or Continence Assessment and Care []  - Incontinence Assessment and Management 0 []  - 0 Ostomy Care Assessment and Management (repouching, etc.) PROCESS - Coordination of Care X - Simple Patient / Family Education for ongoing care 1 15 []  - 0 Complex (extensive) Patient / Family Education for ongoing care X- 1 10 Staff obtains Programmer, systems, Records, Test Results / Process Orders []  - 0 Staff telephones HHA, Nursing Homes / Clarify orders / etc []  - 0 Routine Transfer to another Facility (non-emergent condition) []  - 0 Routine Hospital Admission (non-emergent condition) X- 1 15 New Admissions / Biomedical engineer / Ordering NPWT, Apligraf, etc. []  - 0 Emergency Hospital Admission (emergent condition) X- 1 10 Simple Discharge Coordination []  - 0 Complex  (extensive) Discharge Coordination PROCESS - Special Needs []  - Pediatric / Minor Patient Management 0 []  - 0 Isolation Patient Management Bradley [name], Bradley A. ([id number]) []  - 0 Hearing / Language / Visual special needs []  - 0 Assessment of Community assistance (transportation, D/C planning, etc.) []  - 0 Additional assistance / Altered mentation X- 1 15 Support Surface(s) Assessment (bed, cushion, seat, etc.) INTERVENTIONS -  Wound Cleansing / Measurement X - Wound Imaging (photographs - any number of wounds) 1 5 []  - 0 Wound Tracing (instead of photographs) X- 1 5 Simple Wound Measurement - one wound []  - 0 Complex Wound Measurement - multiple wounds X- 1 5 Simple Wound Cleansing - one wound []  - 0 Complex Wound Cleansing - multiple wounds INTERVENTIONS - Wound Dressings X - Small Wound Dressing one or multiple wounds 1 10 []  - 0 Medium Wound Dressing one or multiple wounds []  - 0 Large Wound Dressing one or multiple wounds []  - 0 Application of Medications - injection INTERVENTIONS - Miscellaneous []  - External ear exam 0 []  - 0 Specimen Collection (cultures, biopsies, blood, body fluids, etc.) []  - 0 Specimen(s) / Culture(s) sent or taken to Lab for analysis X- 1 10 Patient Transfer (multiple staff / Civil Service fast streamer / Similar devices) []  - 0 Simple Staple / Suture removal (25 or less) []  - 0 Complex Staple / Suture removal (26 or more) []  - 0 Hypo / Hyperglycemic Management (close monitor of Blood Glucose) []  - 0 Ankle / Brachial Index (ABI) - do not check if billed separately Has the patient been seen at the hospital within the last three years: Yes Total Score: 150 Level Of Care: New/Established - Level 4 Electronic Signature(s) Signed: 07/16/2019 4:13:47 PM By: Montey Hora Entered By: Montey Hora on 07/16/2019 10:16:59 [name] -------------------------------------------------------------------------------- Encounter Discharge Information  Details Patient Name: Bradley Loa A. Date of Service: 07/16/2019 9:45 AM Medical Record Number: [id number] Patient Account Number: [id number] Date of Birth/Sex: 11-01-1945 (74 y.o. M) Treating RN: Montey Hora Primary Care Victorina Kable: [name] Other Clinician: Referring Jimmie Dattilio: [name] Treating Dashun Borre/Extender: [name], HOYT Weeks in Treatment: 0 Encounter Discharge Information Items Discharge Condition: Stable Ambulatory Status: Wheelchair Discharge Destination: Home Transportation: Private Auto Accompanied By: self Schedule Follow-up Appointment: Yes Clinical Summary of Care: Electronic Signature(s) Signed: 07/16/2019 4:13:47 PM By: Montey Hora Entered By: Montey Hora on 07/16/2019 10:17:51 [name] -------------------------------------------------------------------------------- Lower Extremity Assessment Details Patient Name: Bradley Loa A. Date of Service: 07/16/2019 9:45 AM Medical Record Number: [id number] Patient Account Number: [id number] Date of Birth/Sex: [birth year] (74 y.o. M) Treating RN: Army Melia Primary Care Analee Montee: [name] Other Clinician: Referring Amanii Snethen: [name] Treating Sayla Golonka/Extender: [name], HOYT Weeks in Treatment: 0 Electronic Signature(s) Signed: 07/17/2019 11:18:27 AM By: Army Melia Entered By: Army Melia on 07/16/2019 09:43:55 [name] -------------------------------------------------------------------------------- Multi Wound Chart Details Patient Name: Bradley Loa A. Date of Service: 07/16/2019 9:45 AM Medical Record Number: [id number] Patient Account Number: [id number] Date of Birth/Sex: April 18, 1946 (74 y.o. M) Treating RN: Montey Hora Primary Care Areen Trautner: [name] Other Clinician: Referring Miche Loughridge: [name] Treating Garfield Coiner/Extender: [name], HOYT Weeks in Treatment: 0 Vital Signs Height(in): Pulse(bpm):  31 Weight(lbs): Blood Pressure(mmHg): 126/59 Body Mass Index(BMI): Temperature(F): 97.9 Respiratory Rate 16 (breaths/min): Photos: [N/A:N/A] Wound Location: Sacrum - Midline N/A N/A Wounding Event: Pressure Injury N/A N/A Primary Etiology: Pressure Ulcer N/A N/A Date Acquired: 06/07/2015 N/A N/A Weeks of Treatment: 0 N/A N/A Wound Status: Open N/A N/A Measurements L x W x D 8.2x8x6 N/A N/A (cm) Area (cm) : 51.522 N/A N/A Volume (cm) : [id number] N/A N/A Classification: Category/Stage III N/A N/A Exudate Amount: Large N/A N/A Exudate Type: Serosanguineous N/A N/A Exudate Color: red, brown N/A N/A Granulation Amount: Large (67-100%) N/A N/A Granulation Quality: Red N/A N/A Necrotic Amount: Small (1-33%) N/A N/A Exposed Structures: Fat Layer (Subcutaneous N/A N/A Tissue) Exposed:  Yes Fascia: No Tendon: No Muscle: No Joint: No Bone: No Epithelialization: None N/A N/A Treatment Notes AILAN, MANDL ([id number]) Electronic Signature(s) Signed: 07/16/2019 4:13:47 PM By: Montey Hora Entered By: Montey Hora on 07/16/2019 10:08:24 [name] -------------------------------------------------------------------------------- [place] Details Patient Name: Bradley [name], Bradley A. Date of Service: 07/16/2019 9:45 AM Medical Record Number: [id number] Patient Account Number: [id number] Date of Birth/Sex: [birth year] (74 y.o. M) Treating RN: Montey Hora Primary Care Morrie Daywalt: [name] Other Clinician: Referring Raenell Mensing: [name] Treating Derin Granquist/Extender: [name], HOYT Weeks in Treatment: 0 Active Inactive Abuse / Safety / Falls / Self Care Management Nursing Diagnoses: Impaired physical mobility Goals: Patient will not develop complications from immobility Date Initiated: 07/16/2019 Target Resolution Date: 10/12/2019 Goal Status: Active Interventions: Assess Activities of Daily Living upon admission and as  needed Notes: Orientation to the Wound Care Program Nursing Diagnoses: Knowledge deficit related to the wound healing center program Goals: Patient/caregiver will verbalize understanding of the [facility] Program Date Initiated: 07/16/2019 Target Resolution Date: 10/12/2019 Goal Status: Active Interventions: Provide education on orientation to the wound center Notes: Pressure Nursing Diagnoses: Knowledge deficit related to causes and risk factors for pressure ulcer development Goals: Patient will remain free from development of additional pressure ulcers Date Initiated: 07/16/2019 Target Resolution Date: 10/12/2019 Goal Status: Active Interventions: Provide education on pressure ulcers LEDARRIUS, ROSATI ([id number]) Notes: Wound/Skin Impairment Nursing Diagnoses: Impaired tissue integrity Goals: Ulcer/skin breakdown will heal within 14 weeks Date Initiated: 07/16/2019 Target Resolution Date: 10/12/2019 Goal Status: Active Interventions: Assess patient/caregiver ability to obtain necessary supplies Assess patient/caregiver ability to perform ulcer/skin care regimen upon admission and as needed Assess ulceration(s) every visit Notes: Electronic Signature(s) Signed: 07/16/2019 4:13:47 PM By: Montey Hora Entered By: Montey Hora on 07/16/2019 10:08:13 [name] -------------------------------------------------------------------------------- Pain Assessment Details Patient Name: Bradley Loa A. Date of Service: 07/16/2019 9:45 AM Medical Record Number: [id number] Patient Account Number: [id number] Date of Birth/Sex: [birth year] (74 y.o. M) Treating RN: Montey Hora Primary Care Jernie Schutt: [name] Other Clinician: Referring Fergus Throne: [name] Treating Chadley Dziedzic/Extender: [name], HOYT Weeks in Treatment: 0 Active Problems Location of Pain Severity and Description of Pain Patient Has Paino No Site Locations Pain Management and  Medication Current Pain Management: Electronic Signature(s) Signed: 07/16/2019 1:29:53 PM By: Paulla Fore, RRT, CHT Signed: 07/16/2019 4:13:47 PM By: Montey Hora Entered By: Lorine Bears on 07/16/2019 09:32:23 [name] -------------------------------------------------------------------------------- Patient/Caregiver Education Details Patient Name: Bradley Loa A. Date of Service: 07/16/2019 9:45 AM Medical Record Number: [id number] Patient Account Number: [id number] Date of Birth/Gender: 1945-10-27 (74 y.o. M) Treating RN: Montey Hora Primary Care Physician: [name] Other Clinician: Referring Physician: [name] Treating Physician/Extender: Sharalyn Ink in Treatment: 0 Education Assessment Education Provided To: Patient Education Topics Provided Wound/Skin Impairment: Handouts: Other: wound care as ordered Methods: Explain/Verbal Responses: State content correctly Electronic Signature(s) Signed: 07/16/2019 4:13:47 PM By: Montey Hora Entered By: Montey Hora on 07/16/2019 10:17:26 [name] -------------------------------------------------------------------------------- Wound Assessment Details Patient Name: Bradley Loa A. Date of Service: 07/16/2019 9:45 AM Medical Record Number: [id number] Patient Account Number: [id number] Date of Birth/Sex: 03/05/1946 (74 y.o. M) Treating RN: Army Melia Primary Care Shilynn Hoch: [name] Other Clinician: Referring Mendel Binsfeld: [name] Treating Kasmira Cacioppo/Extender: STONE III, HOYT Weeks in Treatment: 0 Wound Status Wound Number: 10 Primary Pressure Ulcer Etiology: Wound Location: Sacrum - Midline Wound Open Wounding Event: Pressure Injury Status: Date Acquired: 06/07/2015 Comorbid Anemia, Hypertension, History of pressure  Weeks Of Treatment: 0 History: wounds, Rheumatoid Arthritis, Paraplegia Clustered Wound:  No Photos Wound Measurements Length: (cm) 8.2 % Reduction Width: (cm) 8 % Reduction Depth: (cm) 6 Epitheliali Area: (cm) 51.522 Tunneling: Volume: (cm) 309.133 Underminin in Area: 0% in Volume: 0% zation: None No g: No Wound Description Classification: Category/Stage IV Foul Odor A Wound Margin: Distinct, outline attached Slough/Fibr Exudate Amount: Large Exudate Type: Serosanguineous Exudate Color: red, brown fter Cleansing: No ino Yes Wound Bed Granulation Amount: Large (67-100%) Exposed Structure Granulation Quality: Red Fascia Exposed: No Necrotic Amount: Small (1-33%) Fat Layer (Subcutaneous Tissue) Exposed: Yes Necrotic Quality: Adherent Slough Tendon Exposed: No Muscle Exposed: No Joint Exposed: No Bone Exposed: No Treatment Notes DENTRELL, COHEE ([id number]) Wound #10 (Midline Sacrum) Notes anacept moistened gauze, xtrasorb, abd and tape Electronic Signature(s) Signed: 07/16/2019 2:30:53 PM By: Worthy Keeler PA-C Signed: 07/17/2019 11:18:27 AM By: Army Melia Entered By: Worthy Keeler on 07/16/2019 14:30:53 [name] -------------------------------------------------------------------------------- Vitals Details Patient Name: Bradley Loa A. Date of Service: 07/16/2019 9:45 AM Medical Record Number: [id number] Patient Account Number: [id number] Date of Birth/Sex: 06-14-1945 (74 y.o. M) Treating RN: Montey Hora Primary Care Elainah Rhyne: [name] Other Clinician: Referring Flay Ghosh: [name] Treating Lorilyn Laitinen/Extender: [name], HOYT Weeks in Treatment: 0 Vital Signs Time Taken: 09:30 Temperature (F): 97.9 Pulse (bpm): 78 Respiratory Rate (breaths/min): 16 Blood Pressure (mmHg): 126/59 Reference Range: 80 - 120 mg / dl Electronic Signature(s) Signed: 07/16/2019 1:29:53 PM By: Lorine Bears RCP, RRT, CHT Entered By: Lorine Bears on 07/16/2019 09:32:54

## 2019-07-19 DIAGNOSIS — N319 Neuromuscular dysfunction of bladder, unspecified: Secondary | ICD-10-CM | POA: Diagnosis not present

## 2019-07-19 DIAGNOSIS — D509 Iron deficiency anemia, unspecified: Secondary | ICD-10-CM | POA: Diagnosis not present

## 2019-07-19 DIAGNOSIS — N400 Enlarged prostate without lower urinary tract symptoms: Secondary | ICD-10-CM | POA: Diagnosis not present

## 2019-07-19 DIAGNOSIS — G8220 Paraplegia, unspecified: Secondary | ICD-10-CM | POA: Diagnosis not present

## 2019-07-19 DIAGNOSIS — J309 Allergic rhinitis, unspecified: Secondary | ICD-10-CM | POA: Diagnosis not present

## 2019-07-19 DIAGNOSIS — L89154 Pressure ulcer of sacral region, stage 4: Secondary | ICD-10-CM | POA: Diagnosis not present

## 2019-07-19 DIAGNOSIS — Z933 Colostomy status: Secondary | ICD-10-CM | POA: Diagnosis not present

## 2019-07-19 DIAGNOSIS — I10 Essential (primary) hypertension: Secondary | ICD-10-CM | POA: Diagnosis not present

## 2019-07-19 DIAGNOSIS — S24104S Unspecified injury at T11-T12 level of thoracic spinal cord, sequela: Secondary | ICD-10-CM | POA: Diagnosis not present

## 2019-07-22 DIAGNOSIS — N319 Neuromuscular dysfunction of bladder, unspecified: Secondary | ICD-10-CM | POA: Diagnosis not present

## 2019-07-22 DIAGNOSIS — J309 Allergic rhinitis, unspecified: Secondary | ICD-10-CM | POA: Diagnosis not present

## 2019-07-22 DIAGNOSIS — D509 Iron deficiency anemia, unspecified: Secondary | ICD-10-CM | POA: Diagnosis not present

## 2019-07-22 DIAGNOSIS — N400 Enlarged prostate without lower urinary tract symptoms: Secondary | ICD-10-CM | POA: Diagnosis not present

## 2019-07-22 DIAGNOSIS — G8220 Paraplegia, unspecified: Secondary | ICD-10-CM | POA: Diagnosis not present

## 2019-07-22 DIAGNOSIS — S24104S Unspecified injury at T11-T12 level of thoracic spinal cord, sequela: Secondary | ICD-10-CM | POA: Diagnosis not present

## 2019-07-22 DIAGNOSIS — Z933 Colostomy status: Secondary | ICD-10-CM | POA: Diagnosis not present

## 2019-07-22 DIAGNOSIS — L89154 Pressure ulcer of sacral region, stage 4: Secondary | ICD-10-CM | POA: Diagnosis not present

## 2019-07-22 DIAGNOSIS — I10 Essential (primary) hypertension: Secondary | ICD-10-CM | POA: Diagnosis not present

## 2019-07-23 ENCOUNTER — Encounter: Payer: Medicare HMO | Admitting: Physician Assistant

## 2019-07-23 ENCOUNTER — Other Ambulatory Visit: Payer: Self-pay

## 2019-07-23 DIAGNOSIS — I252 Old myocardial infarction: Secondary | ICD-10-CM | POA: Diagnosis not present

## 2019-07-23 DIAGNOSIS — M069 Rheumatoid arthritis, unspecified: Secondary | ICD-10-CM | POA: Diagnosis not present

## 2019-07-23 DIAGNOSIS — G8220 Paraplegia, unspecified: Secondary | ICD-10-CM | POA: Diagnosis not present

## 2019-07-23 DIAGNOSIS — D509 Iron deficiency anemia, unspecified: Secondary | ICD-10-CM | POA: Diagnosis not present

## 2019-07-23 DIAGNOSIS — Z23 Encounter for immunization: Secondary | ICD-10-CM | POA: Diagnosis not present

## 2019-07-23 DIAGNOSIS — Z87891 Personal history of nicotine dependence: Secondary | ICD-10-CM | POA: Diagnosis not present

## 2019-07-23 DIAGNOSIS — I10 Essential (primary) hypertension: Secondary | ICD-10-CM | POA: Diagnosis not present

## 2019-07-23 DIAGNOSIS — L89153 Pressure ulcer of sacral region, stage 3: Secondary | ICD-10-CM | POA: Diagnosis not present

## 2019-07-23 DIAGNOSIS — L89154 Pressure ulcer of sacral region, stage 4: Secondary | ICD-10-CM | POA: Diagnosis not present

## 2019-07-23 NOTE — Progress Notes (Addendum)
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 dressing, since discharge he has had approximately "40%" success with maintaining wound VAC dressing. We discussed at length that this is not a safe or recommended option. We will apply Dakin's wet to dry dressing daily and he will follow-up next week. He is accompanied today by his sister who is willing to assist in dressing changes; they will discuss the social issue as he feels he is capable of changing dressing daily when home health is not able. 09/28/17-He is here in follow-up evaluation  for stage IV sacral pressure ulcer. He has been using the Dakin's wet-to-dry daily; he continues with home health. He is not accompanied by anyone at this visit. He will follow up in two weeks per his request/preference. 10/12/17 on evaluation today patient appears to be doing very well. The Dakin solution went to dry packings do seem to be helping him as far as the sacral wound is concerned I'm not seeing anything that has me more concerned as far as infection or otherwise is concerned. Overall I'm pleased with the appearance of the wound. 10/26/17-He is here in follow up evaluation for a stage IV sacral ulcer. He continues with daily Dakin's wet-to-dry. He is voicing no complaints or concerns. He will follow-up in 2 weeks 11/16/17-He is here for follow up evaluation for a palliative stage IV sacral pressure ulcer. We will continue with Dakin's wet-to- dry. He will follow-up in 4 weeks. He is expressing concern/complaint regarding new bed that has arrived, stating he is unable to manipulate/maneuver it due to the bed crank being at the foot of the bed. 12/14/17-He is here for evaluation for palliative stage IV sacral ulcer. He  is voicing no complaints or concerns. We will continue with one-to-one ratio of saline and dakins. He will follow-up in 3 weeks 01/04/18-He is here in follow-up evaluation for palliative stage IV sacral ulcer. He is inquiring about reinstating the negative pressure wound therapy. We discussed at length that the negative pressure wound therapy in the home setting has not been successful for him repeatedly with loss of cereal and unavailability of 24/7 help; reminded him that home health is not available 24/7 when loss of seal occurs. He does verbalize understanding to this and does not pursue. We also discussed the palliative nature of this ulcer (given no significant change/improvement in measurement/appearance, not a candidate for muscle flap per plastic surgery, and continued  independent living) and that the goal is for maintenance, decrease in infection and minimizing/avoiding deterioration given that he is independent in his care, does not have home health and requires daily dressing changes secondary to drainage amount. He is inquiring about a wound clinic in [facility], I have informed him that I am unfamiliar with that clinic but that he is encouraged to seek another opinion if that is his desire. We will continue with dakins and he will follow up in three weeks 01/25/18-He is here in follow-up evaluation for palliative stage IV sacral ulcer. He continues with Dakin's/saline 1:1 mixture wet to dry dressing changes. He states he has an appointment at [facility] on 9/17 for evaluation of surgical intervention/closure of the sacral ulcer. He will follow-up here in 4 weeks Readmission: 07/16/2019 upon evaluation today patient appears to be doing really about the same as when he was previously seen here in the wound care center. He most recently was a patient of [place] back when she was still working here in the center but had been referred to [facility] for consideration of a flap. With that being said the surgeon there at [facility] stated that this was too large for her flap and they have been attempting to get this smaller in order to be able to proceed with a flap. Nonetheless unfortunately he has had a cycle of going back and forth between the osteomyelitis flaring and then sent him back and then making a little bit of progress only to be sent back again. It sounds like most recently they have been using a Iodoflex type dressing at this point which does not seem to have done any harm by any means. With that being said this wound seems to TACUMA, LAUERMAN. ([id number]) be quite large for using Iodoflex throughout and subsequently I think he may do much better with the use of Vioxx moistened gauze which would be safe for the new tissue growing and also keep the wound quite nice and clean. The  patient is not opposed to this he in fact states that his home health nurse had mentioned this as a possibility as well. 07/23/2019 upon evaluation today patient actually appears to be doing very well in regard to his sacral wound. In fact this is much healthier the measurements not terribly different but again with a wound like this at home necessarily expect a a lot of change as far as the overall measurements are concerned in just 1 week's time. This is going be a much longer term process at this point. With that being said I do think that he is very healthy appearing as far as the base of the wound is concerned. Objective Constitutional Well-nourished and well-hydrated in no acute distress. Vitals Time Taken: 2:15 PM, Temperature: 98.2  F, Pulse: 89 bpm, Respiratory Rate: 16 breaths/min, Blood Pressure: 116/67 mmHg. Respiratory normal breathing without difficulty. Psychiatric this patient is able to make decisions and demonstrates good insight into disease process. Alert and Oriented x 3. pleasant and cooperative. General Notes: Patient's wound bed currently showed signs of good granulation and overall I feel like he is making progress here and again the wound bed is much healthier than when I saw him last week. In general very pleased with this. The patient likewise is extremely happy to hear things are doing this well. Integumentary (Hair, Skin) Wound #10 status is Open. Original cause of wound was Pressure Injury. The wound is located on the Midline Sacrum. The wound measures 7.6cm length x 9cm width x 6cm depth; 53.721cm^2 area and 322.327cm^3 volume. There is Fat Layer (Subcutaneous Tissue) Exposed exposed. There is a large amount of serosanguineous drainage noted. The wound margin is distinct with the outline attached to the wound base. There is large (67-100%) red granulation within the wound bed. There is a small (1-33%) amount of necrotic tissue within the wound bed including  Adherent Slough. Assessment Active Problems ICD-10 Pressure ulcer of sacral region, stage 4 Other chronic osteomyelitis, other site Paraplegia, unspecified KHARSON, FRANZEL A. ([id number]) Plan Wound Cleansing: Wound #10 Midline Sacrum: Clean wound with Normal Saline. Dial antibacterial soap, wash wounds, rinse and pat dry prior to dressing wounds Primary Wound Dressing: Wound #10 Midline Sacrum: Other: - Vashe moistened gauze (Dakins in clinic) Secondary Dressing: Wound #10 Midline Sacrum: ABD pad - secure with tape XtraSorb Dressing Change Frequency: Wound #10 Midline Sacrum: Change dressing every day. - Patient's son to change when [facility] does not visit Follow-up Appointments: Return Appointment in 2 weeks. Off-Loading: Wound #10 Midline Sacrum: Gel wheelchair cushion Turn and reposition every 2 hours Home Health: Wound #10 Midline Sacrum: [place] Nurse may visit [facility] to address patient s wound care needs. FACE TO FACE ENCOUNTER: MEDICARE and MEDICAID PATIENTS: I certify that this patient is under my care and that I had a face-to-face encounter that meets the physician face-to-face encounter requirements with this patient on this date. The encounter with the patient was in whole or in part for the following MEDICAL CONDITION: (primary reason for [place]) MEDICAL NECESSITY: I certify, that based on my findings, NURSING services are a medically necessary home health service. HOME BOUND STATUS: I certify that my clinical findings support that this patient is homebound (i.e., Due to illness or injury, pt requires aid of supportive devices such as crutches, cane, wheelchairs, walkers, the use of special transportation or the assistance of another person to leave their place of residence. There is a normal inability to leave the home and doing so requires considerable and taxing effort. Other absences are for medical reasons / religious services  and are infrequent or of short duration when for other reasons). If current dressing causes regression in wound condition, may D/C ordered dressing product/s and apply Normal Saline Moist Dressing daily until next [place] / Other MD appointment. [place] of regression in wound condition at [phone]. Please direct any NON-WOUND related issues/requests for orders to patient's Primary Care Physician 1. I would recommend currently that we continue with the wound care measures as last week and this is going to include using the Vashe moistened gauze at home Dakin's here in the clinic to pack into the wound that seems to be cleaning things up quite nicely. 2. I would recommend  as well that this needs to be changed every day home health and the patient's son will work together to achieve this goal. 3. I am also going to suggest a appropriate and regular offloading repositioning schedule really he needs to change positions at least every 2 hours. We will see patient back for reevaluation in 2 weeks here in the clinic. If anything worsens or changes patient will contact our office for additional recommendations. KADIAN, KENNERSON ([id number]) Electronic Signature(s) Signed: 07/23/2019 3:15:09 PM By: Worthy Keeler PA-C Entered By: Worthy Keeler on 07/23/2019 15:15:09 Bradley [name] ([id number]) -------------------------------------------------------------------------------- SuperBill Details Patient Name: Bradley [name] Date of Service: 07/23/2019 Medical Record Number: [id number] Patient Account Number: [id number] Date of Birth/Sex: August 07, 1945 (74 y.o. M) Treating RN: Montey Hora Primary Care Provider: [name] Other Clinician: Referring Provider: [name] Treating Provider/Extender: [name], Jaedyn Lard Weeks in Treatment: 1 Diagnosis Coding ICD-10 Codes Code Description L89.154 Pressure ulcer of sacral region, stage 4 M86.68 Other  chronic osteomyelitis, other site G82.20 Paraplegia, unspecified Facility Procedures CPT4 Code: [id number] Description: 99213 - WOUND CARE VISIT-LEV 3 EST PT Modifier: Quantity: 1 Physician Procedures CPT4 Code: [id number] Description: [id number] - WC PHYS LEVEL 3 - EST PT ICD-10 Diagnosis Description L89.154 Pressure ulcer of sacral region, stage 4 M86.68 Other chronic osteomyelitis, other site G82.20 Paraplegia, unspecified Modifier: Quantity: 1 Electronic Signature(s) Signed: 07/23/2019 3:15:20 PM By: Worthy Keeler PA-C Entered By: Worthy Keeler on 07/23/2019 15:15:19

## 2019-07-23 NOTE — Progress Notes (Signed)
CASHON, MITRI ([id number]) Visit Report for 07/23/2019 Arrival Information Details Patient Name: Bradley [name], Bradley [name]. Date of Service: 07/23/2019 2:45 PM Medical Record Number: [id number] Patient Account Number: [id number] Date of Birth/Sex: [birth year] (74 y.o. M) Treating RN: Montey Hora Primary Care Thaxton Pelley: [name] Other Clinician: Referring Kadija Cruzen: [name] Treating Ingris Pasquarella/Extender: [name], HOYT Weeks in Treatment: 1 Visit Information History Since Last Visit Added or deleted any medications: No Patient Arrived: Wheel Chair Any new allergies or adverse reactions: No Arrival Time: 14:17 Had a fall or experienced change in No Accompanied By: self activities of daily living that may affect Transfer Assistance: Harrel Lemon Lift risk of falls: Patient Identification Verified: Yes Signs or symptoms of abuse/neglect since last visito No Secondary Verification Process Completed: Yes Hospitalized since last visit: No Implantable device outside of the clinic excluding No cellular tissue based products placed in the center since last visit: Pain Present Now: No Electronic Signature(s) Signed: 07/23/2019 4:25:21 PM By: Lorine Bears RCP, RRT, CHT Entered By: Lorine Bears on 07/23/2019 14:17:44 [name] -------------------------------------------------------------------------------- Clinic Level of Care Assessment Details Patient Name: Bradley Loa A. Date of Service: 07/23/2019 2:45 PM Medical Record Number: [id number] Patient Account Number: [id number] Date of Birth/Sex: [birth year] (74 y.o. M) Treating RN: Montey Hora Primary Care Lulani Bour: [name] Other Clinician: Referring Naaman Curro: [name] Treating Dorien Bessent/Extender: [name], HOYT Weeks in Treatment: 1 Clinic Level of Care Assessment Items TOOL 4 Quantity Score []  - Use when only an EandM is performed on FOLLOW-UP visit  0 ASSESSMENTS - Nursing Assessment / Reassessment X - Reassessment of Co-morbidities (includes updates in patient status) 1 10 X- 1 5 Reassessment of Adherence to Treatment Plan ASSESSMENTS - Wound and Skin Assessment / Reassessment X - Simple Wound Assessment / Reassessment - one wound 1 5 []  - 0 Complex Wound Assessment / Reassessment - multiple wounds []  - 0 Dermatologic / Skin Assessment (not related to wound area) ASSESSMENTS - Focused Assessment []  - Circumferential Edema Measurements - multi extremities 0 []  - 0 Nutritional Assessment / Counseling / Intervention []  - 0 Lower Extremity Assessment (monofilament, tuning fork, pulses) []  - 0 Peripheral Arterial Disease Assessment (using hand held doppler) ASSESSMENTS - Ostomy and/or Continence Assessment and Care []  - Incontinence Assessment and Management 0 []  - 0 Ostomy Care Assessment and Management (repouching, etc.) PROCESS - Coordination of Care X - Simple Patient / Family Education for ongoing care 1 15 []  - 0 Complex (extensive) Patient / Family Education for ongoing care X- 1 10 Staff obtains Programmer, systems, Records, Test Results / Process Orders []  - 0 Staff telephones HHA, Nursing Homes / Clarify orders / etc []  - 0 Routine Transfer to another Facility (non-emergent condition) []  - 0 Routine Hospital Admission (non-emergent condition) []  - 0 New Admissions / Biomedical engineer / Ordering NPWT, Apligraf, etc. []  - 0 Emergency Hospital Admission (emergent condition) X- 1 10 Simple Discharge Coordination BRAINARD, REGES A. ([id number]) []  - 0 Complex (extensive) Discharge Coordination PROCESS - Special Needs []  - Pediatric / Minor Patient Management 0 []  - 0 Isolation Patient Management []  - 0 Hearing / Language / Visual special needs []  - 0 Assessment of Community assistance (transportation, D/C planning, etc.) []  - 0 Additional assistance / Altered mentation []  - 0 Support Surface(s) Assessment (bed,  cushion, seat, etc.) INTERVENTIONS - Wound Cleansing / Measurement X - Simple Wound Cleansing - one wound 1 5 []  - 0 Complex Wound Cleansing - multiple wounds X- 1 5 Wound Imaging (  photographs - any number of wounds) []  - 0 Wound Tracing (instead of photographs) X- 1 5 Simple Wound Measurement - one wound []  - 0 Complex Wound Measurement - multiple wounds INTERVENTIONS - Wound Dressings X - Small Wound Dressing one or multiple wounds 1 10 []  - 0 Medium Wound Dressing one or multiple wounds []  - 0 Large Wound Dressing one or multiple wounds X- 1 5 Application of Medications - topical []  - 0 Application of Medications - injection INTERVENTIONS - Miscellaneous []  - External ear exam 0 []  - 0 Specimen Collection (cultures, biopsies, blood, body fluids, etc.) []  - 0 Specimen(s) / Culture(s) sent or taken to Lab for analysis X- 1 10 Patient Transfer (multiple staff / Civil Service fast streamer / Similar devices) []  - 0 Simple Staple / Suture removal (25 or less) []  - 0 Complex Staple / Suture removal (26 or more) []  - 0 Hypo / Hyperglycemic Management (close monitor of Blood Glucose) []  - 0 Ankle / Brachial Index (ABI) - do not check if billed separately X- 1 5 Vital Signs Bradley [name], Bradley A. ([id number]) Has the patient been seen at the hospital within the last three years: Yes Total Score: 100 Level Of Care: New/Established - Level 3 Electronic Signature(s) Signed: 07/23/2019 4:37:42 PM By: Montey Hora Entered By: Montey Hora on 07/23/2019 14:51:58 [name] -------------------------------------------------------------------------------- Encounter Discharge Information Details Patient Name: Bradley Loa A. Date of Service: 07/23/2019 2:45 PM Medical Record Number: [id number] Patient Account Number: [id number] Date of Birth/Sex: 12-24-1945 (74 y.o. M) Treating RN: Montey Hora Primary Care Gennaro Lizotte: [name] Other Clinician: Referring Mayling Aber:  [name] Treating Naz Denunzio/Extender: [name], HOYT Weeks in Treatment: 1 Encounter Discharge Information Items Discharge Condition: Stable Ambulatory Status: Wheelchair Discharge Destination: Home Transportation: Private Auto Accompanied By: self Schedule Follow-up Appointment: Yes Clinical Summary of Care: Electronic Signature(s) Signed: 07/23/2019 4:37:42 PM By: Montey Hora Entered By: Montey Hora on 07/23/2019 14:53:07 [name] -------------------------------------------------------------------------------- Lower Extremity Assessment Details Patient Name: Bradley Loa A. Date of Service: 07/23/2019 2:45 PM Medical Record Number: [id number] Patient Account Number: [id number] Date of Birth/Sex: [birth year] (74 y.o. M) Treating RN: Army Melia Primary Care Kaiyla Stahly: [name] Other Clinician: Referring Sheily Lineman: [name] Treating Amairany Schumpert/Extender: [name], HOYT Weeks in Treatment: 1 Electronic Signature(s) Signed: 07/23/2019 3:31:29 PM By: Army Melia Entered By: Army Melia on 07/23/2019 14:26:35 [name] -------------------------------------------------------------------------------- Multi Wound Chart Details Patient Name: Bradley Loa A. Date of Service: 07/23/2019 2:45 PM Medical Record Number: [id number] Patient Account Number: [id number] Date of Birth/Sex: 02/27/1946 (74 y.o. M) Treating RN: Montey Hora Primary Care Rhiana Morash: [name] Other Clinician: Referring Shyla Gayheart: [name] Treating Maricsa Sammons/Extender: [name], HOYT Weeks in Treatment: 1 Vital Signs Height(in): Pulse(bpm): 69 Weight(lbs): Blood Pressure(mmHg): 116/67 Body Mass Index(BMI): Temperature(F): 98.2 Respiratory Rate 16 (breaths/min): Photos: [N/A:N/A] Wound Location: Sacrum - Midline N/A N/A Wounding Event: Pressure Injury N/A N/A Primary Etiology: Pressure Ulcer N/A N/A Comorbid History:  Anemia, Hypertension, History N/A N/A of pressure wounds, Rheumatoid Arthritis, Paraplegia Date Acquired: 06/07/2015 N/A N/A Weeks of Treatment: 1 N/A N/A Wound Status: Open N/A N/A Measurements L x W x D 7.6x9x6 N/A N/A (cm) Area (cm) : 53.721 N/A N/A Volume (cm) : 322.327 N/A N/A % Reduction in Area: -4.30% N/A N/A % Reduction in Volume: -4.30% N/A N/A Classification: Category/Stage IV N/A N/A Exudate Amount: Large N/A N/A Exudate Type: Serosanguineous N/A N/A Exudate Color: red, brown N/A N/A Wound Margin: Distinct, outline attached N/A N/A Granulation Amount: Large (  67-100%) N/A N/A Granulation Quality: Red N/A N/A Necrotic Amount: Small (1-33%) N/A N/A Exposed Structures: Fat Layer (Subcutaneous N/A N/A Tissue) Exposed: Yes Fascia: No Tendon: No Muscle: No Bradley [name], Bradley [name] ([id number]) Joint: No Bone: No Epithelialization: None N/A N/A Treatment Notes Electronic Signature(s) Signed: 07/23/2019 4:37:42 PM By: Montey Hora Entered By: Montey Hora on 07/23/2019 14:50:17 [name] -------------------------------------------------------------------------------- [place] Details Patient Name: Bradley Loa A. Date of Service: 07/23/2019 2:45 PM Medical Record Number: [id number] Patient Account Number: [id number] Date of Birth/Sex: 01-19-1946 (74 y.o. M) Treating RN: Montey Hora Primary Care Karnisha Lefebre: [name] Other Clinician: Referring Labrittany Wechter: [name] Treating Miroslava Santellan/Extender: [name], HOYT Weeks in Treatment: 1 Active Inactive Abuse / Safety / Falls / Self Care Management Nursing Diagnoses: Impaired physical mobility Goals: Patient will not develop complications from immobility Date Initiated: 07/16/2019 Target Resolution Date: 10/12/2019 Goal Status: Active Interventions: Assess Activities of Daily Living upon admission and as needed Notes: Orientation to the Wound Care Program Nursing  Diagnoses: Knowledge deficit related to the wound healing center program Goals: Patient/caregiver will verbalize understanding of the [place] Program Date Initiated: 07/16/2019 Target Resolution Date: 10/12/2019 Goal Status: Active Interventions: Provide education on orientation to the wound center Notes: Pressure Nursing Diagnoses: Knowledge deficit related to causes and risk factors for pressure ulcer development Goals: Patient will remain free from development of additional pressure ulcers Date Initiated: 07/16/2019 Target Resolution Date: 10/12/2019 Goal Status: Active Interventions: Provide education on pressure ulcers Bradley [name], Bradley [name] ([id number]) Notes: Wound/Skin Impairment Nursing Diagnoses: Impaired tissue integrity Goals: Ulcer/skin breakdown will heal within 14 weeks Date Initiated: 07/16/2019 Target Resolution Date: 10/12/2019 Goal Status: Active Interventions: Assess patient/caregiver ability to obtain necessary supplies Assess patient/caregiver ability to perform ulcer/skin care regimen upon admission and as needed Assess ulceration(s) every visit Notes: Electronic Signature(s) Signed: 07/23/2019 4:37:42 PM By: Montey Hora Entered By: Montey Hora on 07/23/2019 14:50:10 [name] -------------------------------------------------------------------------------- Pain Assessment Details Patient Name: Bradley Loa A. Date of Service: 07/23/2019 2:45 PM Medical Record Number: [id number] Patient Account Number: [id number] Date of Birth/Sex: [birth year] (74 y.o. M) Treating RN: Montey Hora Primary Care Derica Leiber: [name] Other Clinician: Referring Tashima Scarpulla: [name] Treating Makyra Corprew/Extender: [name], HOYT Weeks in Treatment: 1 Active Problems Location of Pain Severity and Description of Pain Patient Has Paino No Site Locations Pain Management and Medication Current Pain Management: Electronic  Signature(s) Signed: 07/23/2019 4:25:21 PM By: Paulla Fore, RRT, CHT Signed: 07/23/2019 4:37:42 PM By: Montey Hora Entered By: Lorine Bears on 07/23/2019 14:17:51 [name] -------------------------------------------------------------------------------- Patient/Caregiver Education Details Patient Name: Bradley Loa A. Date of Service: 07/23/2019 2:45 PM Medical Record Number: [id number] Patient Account Number: [id number] Date of Birth/Gender: [birth year] (74 y.o. M) Treating RN: Montey Hora Primary Care Physician: [name] Other Clinician: Referring Physician: [name] Treating Physician/Extender: Sharalyn Ink in Treatment: 1 Education Assessment Education Provided To: Patient Education Topics Provided Wound/Skin Impairment: Handouts: Other: wound care to continue as ordered Methods: Explain/Verbal Responses: State content correctly Electronic Signature(s) Signed: 07/23/2019 4:37:42 PM By: Montey Hora Entered By: Montey Hora on 07/23/2019 14:52:35 [name] -------------------------------------------------------------------------------- Wound Assessment Details Patient Name: Bradley Loa A. Date of Service: 07/23/2019 2:45 PM Medical Record Number: [id number] Patient Account Number: [id number] Date of Birth/Sex: 14-Apr-1946 (74 y.o. M) Treating RN: Army Melia Primary Care Mylan Lengyel: [name] Other Clinician: Referring Aurelia Gras: [name] Treating Alicja Everitt/Extender: [name], HOYT Weeks in Treatment: 1 Wound Status Wound Number: [id number]  Primary Pressure Ulcer Etiology: Wound Location: Sacrum - Midline Wound Open Wounding Event: Pressure Injury Status: Date Acquired: 06/07/2015 Comorbid Anemia, Hypertension, History of pressure Weeks Of Treatment: 1 History: wounds, Rheumatoid Arthritis, Paraplegia Clustered Wound: No Photos Wound  Measurements Length: (cm) 7.6 % Reduction Width: (cm) 9 % Reduction Depth: (cm) 6 Epitheliali Area: (cm) 53.721 Volume: (cm) 322.327 in Area: -4.3% in Volume: -4.3% zation: None Wound Description Classification: Category/Stage IV Foul Odor Wound Margin: Distinct, outline attached Slough/Fib Exudate Amount: Large Exudate Type: Serosanguineous Exudate Color: red, brown After Cleansing: No rino Yes Wound Bed Granulation Amount: Large (67-100%) Exposed Structure Granulation Quality: Red Fascia Exposed: No Necrotic Amount: Small (1-33%) Fat Layer (Subcutaneous Tissue) Exposed: Yes Necrotic Quality: Adherent Slough Tendon Exposed: No Muscle Exposed: No Joint Exposed: No Bone Exposed: No Treatment Notes Bradley [name], Bradley [name]. ([id number]) Wound #10 (Midline Sacrum) Notes anacept moistened gauze, xtrasorb, abd and tape Electronic Signature(s) Signed: 07/23/2019 3:31:29 PM By: Army Melia Entered By: Army Melia on 07/23/2019 14:26:18 [name] -------------------------------------------------------------------------------- Vitals Details Patient Name: Bradley Loa A. Date of Service: 07/23/2019 2:45 PM Medical Record Number: [id number] Patient Account Number: [id number] Date of Birth/Sex: Aug 10, 1945 (74 y.o. M) Treating RN: Montey Hora Primary Care Berline Semrad: [name] Other Clinician: Referring Naketa Daddario: [name] Treating Renee Beale/Extender: [name], HOYT Weeks in Treatment: 1 Vital Signs Time Taken: 14:15 Temperature (F): 98.2 Pulse (bpm): 89 Respiratory Rate (breaths/min): 16 Blood Pressure (mmHg): 116/67 Reference Range: 80 - 120 mg / dl Electronic Signature(s) Signed: 07/23/2019 4:25:21 PM By: Lorine Bears RCP, RRT, CHT Entered By: Lorine Bears on 07/23/2019 14:18:43

## 2019-07-24 DIAGNOSIS — S24104S Unspecified injury at T11-T12 level of thoracic spinal cord, sequela: Secondary | ICD-10-CM | POA: Diagnosis not present

## 2019-07-24 DIAGNOSIS — L89154 Pressure ulcer of sacral region, stage 4: Secondary | ICD-10-CM | POA: Diagnosis not present

## 2019-07-24 DIAGNOSIS — N400 Enlarged prostate without lower urinary tract symptoms: Secondary | ICD-10-CM | POA: Diagnosis not present

## 2019-07-24 DIAGNOSIS — G8220 Paraplegia, unspecified: Secondary | ICD-10-CM | POA: Diagnosis not present

## 2019-07-24 DIAGNOSIS — Z933 Colostomy status: Secondary | ICD-10-CM | POA: Diagnosis not present

## 2019-07-24 DIAGNOSIS — J309 Allergic rhinitis, unspecified: Secondary | ICD-10-CM | POA: Diagnosis not present

## 2019-07-24 DIAGNOSIS — I10 Essential (primary) hypertension: Secondary | ICD-10-CM | POA: Diagnosis not present

## 2019-07-24 DIAGNOSIS — D509 Iron deficiency anemia, unspecified: Secondary | ICD-10-CM | POA: Diagnosis not present

## 2019-07-24 DIAGNOSIS — N319 Neuromuscular dysfunction of bladder, unspecified: Secondary | ICD-10-CM | POA: Diagnosis not present

## 2019-07-26 DIAGNOSIS — D509 Iron deficiency anemia, unspecified: Secondary | ICD-10-CM | POA: Diagnosis not present

## 2019-07-26 DIAGNOSIS — N319 Neuromuscular dysfunction of bladder, unspecified: Secondary | ICD-10-CM | POA: Diagnosis not present

## 2019-07-26 DIAGNOSIS — Z933 Colostomy status: Secondary | ICD-10-CM | POA: Diagnosis not present

## 2019-07-26 DIAGNOSIS — I10 Essential (primary) hypertension: Secondary | ICD-10-CM | POA: Diagnosis not present

## 2019-07-26 DIAGNOSIS — N400 Enlarged prostate without lower urinary tract symptoms: Secondary | ICD-10-CM | POA: Diagnosis not present

## 2019-07-26 DIAGNOSIS — G8220 Paraplegia, unspecified: Secondary | ICD-10-CM | POA: Diagnosis not present

## 2019-07-26 DIAGNOSIS — S24104S Unspecified injury at T11-T12 level of thoracic spinal cord, sequela: Secondary | ICD-10-CM | POA: Diagnosis not present

## 2019-07-26 DIAGNOSIS — L89154 Pressure ulcer of sacral region, stage 4: Secondary | ICD-10-CM | POA: Diagnosis not present

## 2019-07-26 DIAGNOSIS — J309 Allergic rhinitis, unspecified: Secondary | ICD-10-CM | POA: Diagnosis not present

## 2019-07-29 DIAGNOSIS — N400 Enlarged prostate without lower urinary tract symptoms: Secondary | ICD-10-CM | POA: Diagnosis not present

## 2019-07-29 DIAGNOSIS — N319 Neuromuscular dysfunction of bladder, unspecified: Secondary | ICD-10-CM | POA: Diagnosis not present

## 2019-07-29 DIAGNOSIS — I10 Essential (primary) hypertension: Secondary | ICD-10-CM | POA: Diagnosis not present

## 2019-07-29 DIAGNOSIS — Z933 Colostomy status: Secondary | ICD-10-CM | POA: Diagnosis not present

## 2019-07-29 DIAGNOSIS — D509 Iron deficiency anemia, unspecified: Secondary | ICD-10-CM | POA: Diagnosis not present

## 2019-07-29 DIAGNOSIS — L89154 Pressure ulcer of sacral region, stage 4: Secondary | ICD-10-CM | POA: Diagnosis not present

## 2019-07-29 DIAGNOSIS — G8220 Paraplegia, unspecified: Secondary | ICD-10-CM | POA: Diagnosis not present

## 2019-07-29 DIAGNOSIS — J309 Allergic rhinitis, unspecified: Secondary | ICD-10-CM | POA: Diagnosis not present

## 2019-07-29 DIAGNOSIS — S24104S Unspecified injury at T11-T12 level of thoracic spinal cord, sequela: Secondary | ICD-10-CM | POA: Diagnosis not present

## 2019-07-31 DIAGNOSIS — Z933 Colostomy status: Secondary | ICD-10-CM | POA: Diagnosis not present

## 2019-07-31 DIAGNOSIS — N319 Neuromuscular dysfunction of bladder, unspecified: Secondary | ICD-10-CM | POA: Diagnosis not present

## 2019-07-31 DIAGNOSIS — D509 Iron deficiency anemia, unspecified: Secondary | ICD-10-CM | POA: Diagnosis not present

## 2019-07-31 DIAGNOSIS — J309 Allergic rhinitis, unspecified: Secondary | ICD-10-CM | POA: Diagnosis not present

## 2019-07-31 DIAGNOSIS — G8220 Paraplegia, unspecified: Secondary | ICD-10-CM | POA: Diagnosis not present

## 2019-07-31 DIAGNOSIS — S24104S Unspecified injury at T11-T12 level of thoracic spinal cord, sequela: Secondary | ICD-10-CM | POA: Diagnosis not present

## 2019-07-31 DIAGNOSIS — I10 Essential (primary) hypertension: Secondary | ICD-10-CM | POA: Diagnosis not present

## 2019-07-31 DIAGNOSIS — L89154 Pressure ulcer of sacral region, stage 4: Secondary | ICD-10-CM | POA: Diagnosis not present

## 2019-07-31 DIAGNOSIS — N400 Enlarged prostate without lower urinary tract symptoms: Secondary | ICD-10-CM | POA: Diagnosis not present

## 2019-08-02 DIAGNOSIS — I10 Essential (primary) hypertension: Secondary | ICD-10-CM | POA: Diagnosis not present

## 2019-08-02 DIAGNOSIS — N319 Neuromuscular dysfunction of bladder, unspecified: Secondary | ICD-10-CM | POA: Diagnosis not present

## 2019-08-02 DIAGNOSIS — Z933 Colostomy status: Secondary | ICD-10-CM | POA: Diagnosis not present

## 2019-08-02 DIAGNOSIS — D509 Iron deficiency anemia, unspecified: Secondary | ICD-10-CM | POA: Diagnosis not present

## 2019-08-02 DIAGNOSIS — L89154 Pressure ulcer of sacral region, stage 4: Secondary | ICD-10-CM | POA: Diagnosis not present

## 2019-08-02 DIAGNOSIS — N400 Enlarged prostate without lower urinary tract symptoms: Secondary | ICD-10-CM | POA: Diagnosis not present

## 2019-08-02 DIAGNOSIS — S24104S Unspecified injury at T11-T12 level of thoracic spinal cord, sequela: Secondary | ICD-10-CM | POA: Diagnosis not present

## 2019-08-02 DIAGNOSIS — J309 Allergic rhinitis, unspecified: Secondary | ICD-10-CM | POA: Diagnosis not present

## 2019-08-02 DIAGNOSIS — G8220 Paraplegia, unspecified: Secondary | ICD-10-CM | POA: Diagnosis not present

## 2019-08-03 DIAGNOSIS — L89154 Pressure ulcer of sacral region, stage 4: Secondary | ICD-10-CM | POA: Diagnosis not present

## 2019-08-03 DIAGNOSIS — G8220 Paraplegia, unspecified: Secondary | ICD-10-CM | POA: Diagnosis not present

## 2019-08-03 DIAGNOSIS — M869 Osteomyelitis, unspecified: Secondary | ICD-10-CM | POA: Diagnosis not present

## 2019-08-05 DIAGNOSIS — L89154 Pressure ulcer of sacral region, stage 4: Secondary | ICD-10-CM | POA: Diagnosis not present

## 2019-08-05 DIAGNOSIS — N400 Enlarged prostate without lower urinary tract symptoms: Secondary | ICD-10-CM | POA: Diagnosis not present

## 2019-08-05 DIAGNOSIS — I10 Essential (primary) hypertension: Secondary | ICD-10-CM | POA: Diagnosis not present

## 2019-08-05 DIAGNOSIS — S24104S Unspecified injury at T11-T12 level of thoracic spinal cord, sequela: Secondary | ICD-10-CM | POA: Diagnosis not present

## 2019-08-05 DIAGNOSIS — J309 Allergic rhinitis, unspecified: Secondary | ICD-10-CM | POA: Diagnosis not present

## 2019-08-05 DIAGNOSIS — D509 Iron deficiency anemia, unspecified: Secondary | ICD-10-CM | POA: Diagnosis not present

## 2019-08-05 DIAGNOSIS — N319 Neuromuscular dysfunction of bladder, unspecified: Secondary | ICD-10-CM | POA: Diagnosis not present

## 2019-08-05 DIAGNOSIS — Z933 Colostomy status: Secondary | ICD-10-CM | POA: Diagnosis not present

## 2019-08-05 DIAGNOSIS — G8220 Paraplegia, unspecified: Secondary | ICD-10-CM | POA: Diagnosis not present

## 2019-08-06 ENCOUNTER — Other Ambulatory Visit: Payer: Self-pay

## 2019-08-06 ENCOUNTER — Encounter: Payer: Medicare HMO | Attending: Physician Assistant | Admitting: Physician Assistant

## 2019-08-06 DIAGNOSIS — M069 Rheumatoid arthritis, unspecified: Secondary | ICD-10-CM | POA: Insufficient documentation

## 2019-08-06 DIAGNOSIS — L89159 Pressure ulcer of sacral region, unspecified stage: Secondary | ICD-10-CM | POA: Diagnosis not present

## 2019-08-06 DIAGNOSIS — L89153 Pressure ulcer of sacral region, stage 3: Secondary | ICD-10-CM | POA: Insufficient documentation

## 2019-08-06 DIAGNOSIS — M8668 Other chronic osteomyelitis, other site: Secondary | ICD-10-CM | POA: Insufficient documentation

## 2019-08-06 DIAGNOSIS — D509 Iron deficiency anemia, unspecified: Secondary | ICD-10-CM | POA: Diagnosis not present

## 2019-08-06 DIAGNOSIS — L89154 Pressure ulcer of sacral region, stage 4: Secondary | ICD-10-CM | POA: Insufficient documentation

## 2019-08-06 DIAGNOSIS — Z933 Colostomy status: Secondary | ICD-10-CM | POA: Insufficient documentation

## 2019-08-06 DIAGNOSIS — G8220 Paraplegia, unspecified: Secondary | ICD-10-CM | POA: Diagnosis not present

## 2019-08-06 NOTE — Progress Notes (Addendum)
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 there'll be Bradley plan for myocutaneous flap closure In looking through [place] link I couldn't find any more plastic surgery appointments. I did come across the fact that he is been followed by hematology for Bradley microcytic hypochromic anemia. He had Bradley reasonably normal looking hemoglobin electrophoresis. His iron level was 10 and according to the patient he is going for IV iron infusions starting tomorrow. He had Bradley sedimentation rate of 74. More problematically from Bradley pure wound care point of view his albumin was 2.7 earlier this month 05/17/17; this is Bradley patient I follow monthly. He has Bradley large now stage IV wound over his bilateral buttocks with close proximity to his anal opening. More recently he has developed Bradley large area with exposed bone in the center of this probably secondary to the underlying osteomyelitis E had in the summer. He also follows with Dr. Myriam Jacobson at [facility] who is plastic surgery. He had an appointment earlier this month and according to the patient Dr. Myriam Jacobson does not want to proceed with any attempted flap closure. Although I do not have current access to her note in care everywhere this is likely due to exposed bone. Again according to the patient they did Bradley bone biopsy. He is still using Bradley variant of Dakin solution changing twice Bradley day. He has home health. The patient is not able to give me Bradley firm answer about how long he spends on this in his wheelchair The patient also states that Dr. Myriam Jacobson wanted to reconsider Bradley wound VAC. I really don't see this as Bradley viable option at least not in the outpatient setting. The wound itself is frankly to close to his anal opening to maintain Bradley seal. The last time we tried to do  this home health was unable to manage it. It might be possible to maintain Bradley wound VAC in this setting outside of the home such as Bradley skilled nursing facility or an [place] however I am doubtful about this even in that setting **** READMISSION 09/21/17-He is here for evaluation of stage IV sacral ulcer. Since his last evaluation here in December he has completed treatment for sacral osteomyelitis. He was at [facility] for IV therapy and NPWT dressing changes. He was discharged, with home health services, in February. He admits that while in the skilled facility he had "80%" success with maintaining dressing, since discharge he has had approximately "40%" success with maintaining wound VAC dressing. We discussed at length that this is not Bradley safe or recommended option. We will apply Dakin's wet to dry dressing daily and he will follow-up next week. He is accompanied today by his sister who is willing to assist in dressing changes; they will discuss the social issue as he feels he is capable of changing dressing daily when home health is not able. 09/28/17-He is here in follow-up evaluation for stage IV sacral pressure  ulcer. He has been using the Dakin's wet-to-dry daily; he continues with home health. He is not accompanied by anyone at this visit. He will follow up in two weeks per his request/preference. 10/12/17 on evaluation today patient appears to be doing very well. The Dakin solution went to dry packings do seem to be helping him as far as the sacral wound is concerned I'm not seeing anything that has me more concerned as far as infection or otherwise is concerned. Overall I'm pleased with the appearance of the wound. 10/26/17-He is here in follow up evaluation for Bradley stage IV sacral ulcer. He continues with daily Dakin's wet-to-dry. He is voicing no complaints or concerns. He will follow-up in 2 weeks 11/16/17-He is here for follow up evaluation for Bradley palliative stage IV sacral pressure ulcer.  We will continue with Dakin's wet-to-dry. He will follow-up in 4 weeks. He is expressing concern/complaint regarding new bed that has arrived, stating he is unable to manipulate/maneuver it due to the bed crank being at the foot of the bed. 12/14/17-He is here for evaluation for palliative stage IV sacral ulcer. He is voicing no complaints or concerns. We will continue with one-to-one ratio of saline and dakins. He will follow-up in 3 weeks 01/04/18-He is here in follow-up evaluation for palliative stage IV sacral ulcer. He is inquiring about reinstating the negative pressure wound therapy. We discussed at length that the negative pressure wound therapy in the home setting has not been successful for him repeatedly with loss of cereal and unavailability of 24/7 help; reminded him that home health is not available 24/7 when loss of seal occurs. He does verbalize understanding to this and does not pursue. We also discussed the palliative nature of this ulcer (given no significant change/improvement in measurement/appearance, not Bradley candidate for muscle flap per plastic surgery, and continued independent living) and that the goal is for maintenance, decrease in infection and minimizing/avoiding deterioration given that he is independent in his care, does not have home health and requires daily dressing changes secondary to drainage amount. He is inquiring about Bradley wound clinic in [facility], I have informed him that I am unfamiliar with that clinic but that he is encouraged to seek another opinion if that is his desire. We will continue with dakins and he will follow up in three weeks 01/25/18-He is here in follow-up evaluation for palliative stage IV sacral ulcer. He continues with Dakin's/saline 1:1 mixture wet to dry dressing changes. He states he has an appointment at [facility] on 9/17 for evaluation of surgical intervention/closure of the sacral ulcer. He will follow-up here in 4 weeks Readmission: 07/16/2019 upon  evaluation today patient appears to be doing really about the same as when he was previously seen here in the wound care center. He most recently was Bradley patient of [place] back when she was still working here in the center but had been referred to [facility] for consideration of Bradley flap. With that being said the surgeon there at [facility] stated that this was too large for her flap and they have been attempting to get this smaller in order to be able to proceed with Bradley flap. Nonetheless unfortunately he has had Bradley cycle of going back and forth between the osteomyelitis flaring and then sent him back and then making Bradley little bit of progress only to be sent back again. It sounds like most recently they have been using Bradley Iodoflex type dressing at this point which does not seem to have done  any harm by any means. With that being said this wound seems to be quite large for using Iodoflex throughout and subsequently I think he may do much better with the use of Vioxx moistened gauze which would be safe for the new tissue growing and also keep the wound quite nice and clean. The patient is not opposed to this he in fact states that his home health nurse had mentioned this as Bradley [place]. ([id number]) possibility as well. 07/23/2019 upon evaluation today patient actually appears to be doing very well in regard to his sacral wound. In fact this is much healthier the measurements not terribly different but again with Bradley wound like this at home necessarily expect Bradley Bradley lot of change as far as the overall measurements are concerned in just 1 week's time. This is going be Bradley much longer term process at this point. With that being said I do think that he is very healthy appearing as far as the base of the wound is concerned. 08/06/2019 upon evaluation today patient actually appears to be doing about the same with regard to his wound to be honest. There is really not Bradley significant improvement overall based on what I am seeing today.  Fortunately there is no signs of significant systemic infection. No fevers, chills, nausea, vomiting, or diarrhea. With that being said there is odor to the drainage from the wound and subsequently also what appears to be increased drainage based on what we are seeing today as well as what his home health nurse called [place] about that she was concerned with as well. Objective Constitutional Well-nourished and well-hydrated in no acute distress. Vitals Time Taken: 1:36 PM, Temperature: 98.4 F, Pulse: 90 bpm, Respiratory Rate: 16 breaths/min, Blood Pressure: 125/72 mmHg. Respiratory normal breathing without difficulty. Psychiatric this patient is able to make decisions and demonstrates good insight into disease process. Alert and Oriented x 3. pleasant and cooperative. General Notes: Upon inspection the patient continues to have significant drainage there was really no significant slough buildup which was good news and no sharp debridement was required today. I did actually clean the wound mechanically to clear away slough and any biofilm from the surface of the wound. I did then obtain Bradley culture from the base of the wound deep in order to send for sensitivity to see if there is anything we need to do from an infection standpoint treatment wise. Hopefully will have this back by Friday. Integumentary (Hair, Skin) Wound #10 status is Open. Original cause of wound was Pressure Injury. The wound is located on the Midline Sacrum. The wound measures 8cm length x 13cm width x 5.4cm depth; 81.681cm^2 area and 441.08cm^3 volume. There is Fat Layer (Subcutaneous Tissue) Exposed exposed. There is no tunneling or undermining noted. There is Bradley large amount of serosanguineous drainage noted. The wound margin is distinct with the outline attached to the wound base. There is large (67-100%) red granulation within the wound bed. There is Bradley small (1-33%) amount of necrotic tissue within the wound bed including  Adherent Slough. Assessment Active Problems ICD-10 Pressure ulcer of sacral region, stage 4 Other chronic osteomyelitis, other site Paraplegia, unspecified Plan Wound Cleansing: Wound #10 Midline Sacrum: Clean wound with Normal Saline. Dial antibacterial soap, wash wounds, rinse and pat dry prior to dressing wounds Bradley [name], Bradley Bradley. ([id number]) Primary Wound Dressing: Wound #10 Midline Sacrum: Other: - Vashe moistened gauze (Dakins in clinic) Secondary Dressing: Wound #10 Midline Sacrum: ABD pad - secure with tape XtraSorb  Dressing Change Frequency: Wound #10 Midline Sacrum: Change dressing every day. - Patient's son to change when [facility] does not visit Follow-up Appointments: Return Appointment in 1 week. Off-Loading: Wound #10 Midline Sacrum: Gel wheelchair cushion Turn and reposition every 2 hours Home Health: Wound #10 Midline Sacrum: [place] Nurse may visit PRN to address patient s wound care needs. FACE TO FACE ENCOUNTER: MEDICARE and MEDICAID PATIENTS: I certify that this patient is under my care and that I had Bradley face-to-face encounter that meets the physician face-to-face encounter requirements with this patient on this date. The encounter with the patient was in whole or in part for the following MEDICAL CONDITION: (primary reason for [place]) MEDICAL NECESSITY: I certify, that based on my findings, NURSING services are Bradley medically necessary home health service. HOME BOUND STATUS: I certify that my clinical findings support that this patient is homebound (i.e., Due to illness or injury, pt requires aid of supportive devices such as crutches, cane, wheelchairs, walkers, the use of special transportation or the assistance of another person to leave their place of residence. There is Bradley normal inability to leave the home and doing so requires considerable and taxing effort. Other absences are for medical reasons / religious services and  are infrequent or of short duration when for other reasons). If current dressing causes regression in wound condition, may D/C ordered dressing product/s and apply Normal Saline Moist Dressing daily until next [place] / Other MD appointment. [place] of regression in wound condition at [phone]. Please direct any NON-WOUND related issues/requests for orders to patient's Primary Care Physician Laboratory ordered were: Wound culture routine The following medication(s) was prescribed: Bactrim DS oral 800 mg-160 mg tablet 1 1 tablet oral taken 2 times Bradley day for 14 days starting 08/09/2019 1. My suggestion currently is can be that we continue with the current wound care measures utilizing the Dakin's and/or Vashe moistened gauze packed into the wound bed. 2. I am also going to suggest at this point that we continue with appropriate offloading I think the patient needs to try to be very cognizant of this. He should not be in any 1 position longer than 2 hours. 3. I am also going to recommend that we continue to monitor for any worsening infection obviously if I get the results of the culture back and it shows evidence of infection then I will initiate treatment appropriate at that point. We will see patient back for reevaluation in 1 week here in the clinic. If anything worsens or changes patient will contact our office for additional recommendations. Electronic Signature(s) Signed: 08/09/2019 4:41:02 PM By: Worthy Keeler PA-C Previous Signature: 08/06/2019 5:35:58 PM Version By: Worthy Keeler PA-C Entered By: Worthy Keeler on 08/09/2019 16:41:02 Bradley [name] ([id number]) -------------------------------------------------------------------------------- SuperBill Details Patient Name: Bradley Loa Bradley. Date of Service: 08/06/2019 Medical Record Number: [id number] Patient Account Number: [id number] Date of Birth/Sex: [birth year] (74 y.o. M) Treating RN: Montey Hora Primary Care Provider: [name] Other Clinician: Referring Provider: [name] Treating Provider/Extender: [name], Kalob Bergen Weeks in Treatment: 3 Diagnosis Coding ICD-10 Codes Code Description L89.154 Pressure ulcer of sacral region, stage 4 M86.68 Other chronic osteomyelitis, other site G82.20 Paraplegia, unspecified Facility Procedures CPT4 Code: [id number] Description: 99213 - WOUND CARE VISIT-LEV 3 EST PT Modifier: Quantity: 1 Physician Procedures CPT4 Code: [id number] Description: 99214 - WC PHYS LEVEL 4 - EST PT Modifier: Quantity: 1 CPT4 Code: Description: ICD-10 Diagnosis  Description L89.154 Pressure ulcer of sacral region, stage 4 G82.20 Paraplegia, unspecified M86.68 Other chronic osteomyelitis, other site Modifier: Quantity: Electronic Signature(s) Signed: 08/06/2019 5:36:36 PM By: Worthy Keeler PA-C Entered By: Worthy Keeler on 08/06/2019 17:36:35

## 2019-08-07 ENCOUNTER — Other Ambulatory Visit
Admission: RE | Admit: 2019-08-07 | Discharge: 2019-08-07 | Disposition: A | Discharge status: Home / Self Care | Payer: Medicare HMO | Source: Ambulatory Visit | Attending: Physician Assistant | Admitting: Physician Assistant

## 2019-08-07 DIAGNOSIS — J309 Allergic rhinitis, unspecified: Secondary | ICD-10-CM | POA: Diagnosis not present

## 2019-08-07 DIAGNOSIS — L89154 Pressure ulcer of sacral region, stage 4: Secondary | ICD-10-CM | POA: Diagnosis not present

## 2019-08-07 DIAGNOSIS — N319 Neuromuscular dysfunction of bladder, unspecified: Secondary | ICD-10-CM | POA: Diagnosis not present

## 2019-08-07 DIAGNOSIS — B999 Unspecified infectious disease: Secondary | ICD-10-CM | POA: Insufficient documentation

## 2019-08-07 DIAGNOSIS — Z933 Colostomy status: Secondary | ICD-10-CM | POA: Diagnosis not present

## 2019-08-07 DIAGNOSIS — N400 Enlarged prostate without lower urinary tract symptoms: Secondary | ICD-10-CM | POA: Diagnosis not present

## 2019-08-07 DIAGNOSIS — D509 Iron deficiency anemia, unspecified: Secondary | ICD-10-CM | POA: Diagnosis not present

## 2019-08-07 DIAGNOSIS — G8220 Paraplegia, unspecified: Secondary | ICD-10-CM | POA: Diagnosis not present

## 2019-08-07 DIAGNOSIS — I10 Essential (primary) hypertension: Secondary | ICD-10-CM | POA: Diagnosis not present

## 2019-08-07 DIAGNOSIS — S24104S Unspecified injury at T11-T12 level of thoracic spinal cord, sequela: Secondary | ICD-10-CM | POA: Diagnosis not present

## 2019-08-07 NOTE — Progress Notes (Addendum)
TAKOTA, JACQUEZ ([id number]) Visit Report for 08/06/2019 Allergy List Details Patient Name: Bradley [name], Bradley [name]. Date of Service: 08/06/2019 1:30 PM Medical Record Number: [id number] Patient Account Number: [id number] Date of Birth/Sex: February 01, 1946 (74 y.o. M) Treating RN: Montey Hora Primary Care Gaylen Pereira: [name] Other Clinician: Referring Kynnedi Zweig: [name] Treating Netanya Yazdani/Extender: [name], HOYT Weeks in Treatment: 3 Allergies Active Allergies amoxicillin Reaction: rash Allergy Notes Electronic Signature(s) Signed: 08/09/2019 4:43:27 PM By: Montey Hora Entered By: Montey Hora on 08/09/2019 16:43:27 Bradley [name] ([id number]) -------------------------------------------------------------------------------- Arrival Information Details Patient Name: Bradley Loa A. Date of Service: 08/06/2019 1:30 PM Medical Record Number: [id number] Patient Account Number: [id number] Date of Birth/Sex: 1945/11/23 (74 y.o. M) Treating RN: Montey Hora Primary Care Regla Fitzgibbon: [name] Other Clinician: Referring Nihal Marzella: [name] Treating Javione Gunawan/Extender: [name], HOYT Weeks in Treatment: 3 Visit Information Patient Arrived: Wheel Chair Arrival Time: 13:33 Accompanied By: self Transfer Assistance: [place] Patient Identification Verified: Yes Secondary Verification Process Completed: Yes History Since Last Visit Added or deleted any medications: No Any new allergies or adverse reactions: No Had a fall or experienced change in activities of daily living that may affect risk of falls: No Signs or symptoms of abuse/neglect since last visito No Hospitalized since last visit: No Implantable device outside of the clinic excluding cellular tissue based products placed in the center since last visit: No Has Dressing in Place as Prescribed: Yes Electronic Signature(s) Signed: 08/06/2019 4:01:06 PM By: Lorine Bears RCP, RRT,  CHT Entered By: Lorine Bears on 08/06/2019 13:36:33 Bradley [name] ([id number]) -------------------------------------------------------------------------------- Clinic Level of Care Assessment Details Patient Name: Bradley Loa A. Date of Service: 08/06/2019 1:30 PM Medical Record Number: [id number] Patient Account Number: [id number] Date of Birth/Sex: 25-Jun-1945 (74 y.o. M) Treating RN: Montey Hora Primary Care Kierston Plasencia: [name] Other Clinician: Referring Allona Gondek: [name] Treating Kisa Fujii/Extender: [name], HOYT Weeks in Treatment: 3 Clinic Level of Care Assessment Items TOOL 4 Quantity Score []  - Use when only an EandM is performed on FOLLOW-UP visit 0 ASSESSMENTS - Nursing Assessment / Reassessment X - Reassessment of Co-morbidities (includes updates in patient status) 1 10 X- 1 5 Reassessment of Adherence to Treatment Plan ASSESSMENTS - Wound and Skin Assessment / Reassessment X - Simple Wound Assessment / Reassessment - one wound 1 5 []  - 0 Complex Wound Assessment / Reassessment - multiple wounds []  - 0 Dermatologic / Skin Assessment (not related to wound area) ASSESSMENTS - Focused Assessment []  - Circumferential Edema Measurements - multi extremities 0 []  - 0 Nutritional Assessment / Counseling / Intervention []  - 0 Lower Extremity Assessment (monofilament, tuning fork, pulses) []  - 0 Peripheral Arterial Disease Assessment (using hand held doppler) ASSESSMENTS - Ostomy and/or Continence Assessment and Care []  - Incontinence Assessment and Management 0 []  - 0 Ostomy Care Assessment and Management (repouching, etc.) PROCESS - Coordination of Care X - Simple Patient / Family Education for ongoing care 1 15 []  - 0 Complex (extensive) Patient / Family Education for ongoing care X- 1 10 Staff obtains Programmer, systems, Records, Test Results / Process Orders []  - 0 Staff telephones HHA, Nursing Homes / Clarify orders / etc []  -  0 Routine Transfer to another Facility (non-emergent condition) []  - 0 Routine Hospital Admission (non-emergent condition) []  - 0 New Admissions / Biomedical engineer / Ordering NPWT, Apligraf, etc. []  - 0 Emergency Hospital Admission (emergent condition) X- 1 10 Simple Discharge Coordination []  - 0 Complex (extensive) Discharge Coordination PROCESS - Special Needs []  -  Pediatric / Minor Patient Management 0 []  - 0 Isolation Patient Management []  - 0 Hearing / Language / Visual special needs []  - 0 Assessment of Community assistance (transportation, D/C planning, etc.) Bradley [name], ZENTENO. ([id number]) []  - 0 Additional assistance / Altered mentation []  - 0 Support Surface(s) Assessment (bed, cushion, seat, etc.) INTERVENTIONS - Wound Cleansing / Measurement X - Simple Wound Cleansing - one wound 1 5 []  - 0 Complex Wound Cleansing - multiple wounds X- 1 5 Wound Imaging (photographs - any number of wounds) []  - 0 Wound Tracing (instead of photographs) X- 1 5 Simple Wound Measurement - one wound []  - 0 Complex Wound Measurement - multiple wounds INTERVENTIONS - Wound Dressings []  - Small Wound Dressing one or multiple wounds 0 X- 1 15 Medium Wound Dressing one or multiple wounds []  - 0 Large Wound Dressing one or multiple wounds []  - 0 Application of Medications - topical []  - 0 Application of Medications - injection INTERVENTIONS - Miscellaneous []  - External ear exam 0 X- 1 5 Specimen Collection (cultures, biopsies, blood, body fluids, etc.) X- 1 5 Specimen(s) / Culture(s) sent or taken to Lab for analysis X- 1 10 Patient Transfer (multiple staff / Civil Service fast streamer / Similar devices) []  - 0 Simple Staple / Suture removal (25 or less) []  - 0 Complex Staple / Suture removal (26 or more) []  - 0 Hypo / Hyperglycemic Management (close monitor of Blood Glucose) []  - 0 Ankle / Brachial Index (ABI) - do not check if billed separately X- 1 5 Vital Signs Has the  patient been seen at the hospital within the last three years: Yes Total Score: 110 Level Of Care: New/Established - Level 3 Electronic Signature(s) Signed: 08/06/2019 4:45:34 PM By: Montey Hora Entered By: Montey Hora on 08/06/2019 14:48:41 Bradley [name] ([id number]) -------------------------------------------------------------------------------- Encounter Discharge Information Details Patient Name: Bradley Loa A. Date of Service: 08/06/2019 1:30 PM Medical Record Number: [id number] Patient Account Number: [id number] Date of Birth/Sex: 01-15-1946 (74 y.o. M) Treating RN: Montey Hora Primary Care Keya Wynes: [name] Other Clinician: Referring Saunders [place]: [name] Treating Ishitha Roper/Extender: [name], HOYT Weeks in Treatment: 3 Encounter Discharge Information Items Discharge Condition: Stable Ambulatory Status: Wheelchair Discharge Destination: Home Transportation: Private Auto Accompanied By: self Schedule Follow-up Appointment: Yes Clinical Summary of Care: Electronic Signature(s) Signed: 08/06/2019 4:45:34 PM By: Montey Hora Entered By: Montey Hora on 08/06/2019 14:49:46 Bradley [name] ([id number]) -------------------------------------------------------------------------------- Lower Extremity Assessment Details Patient Name: Bradley Loa A. Date of Service: 08/06/2019 1:30 PM Medical Record Number: [id number] Patient Account Number: [id number] Date of Birth/Sex: [birth year] (74 y.o. M) Treating RN: Cornell Barman Primary Care Lyrick Lagrand: [name] Other Clinician: Referring Vernica Wachtel: [name] Treating Jolita Haefner/Extender: Sharalyn Ink in Treatment: 3 Electronic Signature(s) Signed: 08/07/2019 9:28:34 AM By: Gretta Cool, BSN, RN, CWS, Kim RN, BSN Entered By: Gretta Cool, BSN, RN, CWS, Kim on 08/06/2019 13:50:39 Bradley [name] ([id number]) -------------------------------------------------------------------------------- Multi Wound Chart  Details Patient Name: Bradley [name], Bradley A. Date of Service: 08/06/2019 1:30 PM Medical Record Number: [id number] Patient Account Number: [id number] Date of Birth/Sex: Jun 18, 1945 (74 y.o. M) Treating RN: Montey Hora Primary Care Wednesday Ericsson: [name] Other Clinician: Referring Jerran Tappan: [name] Treating [place]/Extender: [name], HOYT Weeks in Treatment: 3 Vital Signs Height(in): Pulse(bpm): 29 Weight(lbs): Blood Pressure(mmHg): 125/72 Body Mass Index(BMI): Temperature(F): 98.4 Respiratory Rate(breaths/min): 16 Photos: [N/A:N/A] Wound Location: Sacrum - Midline N/A N/A Wounding Event: Pressure Injury N/A N/A Primary Etiology: Pressure Ulcer N/A N/A Comorbid History: Anemia, Hypertension, History of N/A N/A pressure  wounds, Rheumatoid Arthritis, Paraplegia Date Acquired: 06/07/2015 N/A N/A Weeks of Treatment: 3 N/A N/A Wound Status: Open N/A N/A Measurements L x W x D (cm) 8x13x5.4 N/A N/A Area (cm) : 81.681 N/A N/A Volume (cm) : 441.08 N/A N/A % Reduction in Area: -58.50% N/A N/A % Reduction in Volume: -42.70% N/A N/A Classification: Category/Stage IV N/A N/A Exudate Amount: Large N/A N/A Exudate Type: Serosanguineous N/A N/A Exudate Color: red, brown N/A N/A Wound Margin: Distinct, outline attached N/A N/A Granulation Amount: Large (67-100%) N/A N/A Granulation Quality: Red N/A N/A Necrotic Amount: Small (1-33%) N/A N/A Exposed Structures: Fat Layer (Subcutaneous Tissue) N/A N/A Exposed: Yes Fascia: No Tendon: No Muscle: No Joint: No Bone: No Epithelialization: None N/A N/A Treatment Notes Electronic Signature(s) Signed: 08/06/2019 4:45:34 PM By: Montey Hora Entered By: Montey Hora on 08/06/2019 14:40:54 Bradley [name] ([id number]) Bradley [name] ([id number]) -------------------------------------------------------------------------------- Multi-Disciplinary Care Plan Details Patient Name: Bradley [name], Bradley A. Date of Service: 08/06/2019 1:30  PM Medical Record Number: [id number] Patient Account Number: [id number] Date of Birth/Sex: [birth year] (74 y.o. M) Treating RN: Montey Hora Primary Care Talasia Saulter: [name] Other Clinician: Referring Shaquella Stamant: [name] Treating Kennedee Kitzmiller/Extender: [name], HOYT Weeks in Treatment: 3 Active Inactive Abuse / Safety / Falls / Self Care Management Nursing Diagnoses: Impaired physical mobility Goals: Patient will not develop complications from immobility Date Initiated: 07/16/2019 Target Resolution Date: 10/12/2019 Goal Status: Active Interventions: Assess Activities of Daily Living upon admission and as needed Notes: Orientation to the Wound Care Program Nursing Diagnoses: Knowledge deficit related to the wound healing center program Goals: Patient/caregiver will verbalize understanding of the [facility] Program Date Initiated: 07/16/2019 Target Resolution Date: 10/12/2019 Goal Status: Active Interventions: Provide education on orientation to the wound center Notes: Pressure Nursing Diagnoses: Knowledge deficit related to causes and risk factors for pressure ulcer development Goals: Patient will remain free from development of additional pressure ulcers Date Initiated: 07/16/2019 Target Resolution Date: 10/12/2019 Goal Status: Active Interventions: Provide education on pressure ulcers Notes: Wound/Skin Impairment Nursing Diagnoses: Impaired tissue integrity NIVED, SIMONICH ([id number]) Goals: Ulcer/skin breakdown will heal within 14 weeks Date Initiated: 07/16/2019 Target Resolution Date: 10/12/2019 Goal Status: Active Interventions: Assess patient/caregiver ability to obtain necessary supplies Assess patient/caregiver ability to perform ulcer/skin care regimen upon admission and as needed Assess ulceration(s) every visit Notes: Electronic Signature(s) Signed: 08/06/2019 4:45:34 PM By: Montey Hora Entered By: Montey Hora on 08/06/2019  14:40:45 Bradley [name] ([id number]) -------------------------------------------------------------------------------- Pain Assessment Details Patient Name: Bradley Loa A. Date of Service: 08/06/2019 1:30 PM Medical Record Number: [id number] Patient Account Number: [id number] Date of Birth/Sex: [birth year] (74 y.o. M) Treating RN: Cornell Barman Primary Care Nishika Parkhurst: [name] Other Clinician: Referring Henley Blyth: [name] Treating Tonika Eden/Extender: [name], HOYT Weeks in Treatment: 3 Active Problems Location of Pain Severity and Description of Pain Patient Has Paino No Site Locations Pain Management and Medication Current Pain Management: Notes Patient denies pain in the wound areas. Electronic Signature(s) Signed: 08/07/2019 9:28:34 AM By: Gretta Cool, BSN, RN, CWS, Kim RN, BSN Entered By: Gretta Cool, BSN, RN, CWS, Kim on 08/06/2019 13:45:28 Bradley [name] ([id number]) -------------------------------------------------------------------------------- Patient/Caregiver Education Details Patient Name: Bradley [name], Bradley A. Date of Service: 08/06/2019 1:30 PM Medical Record Number: [id number] Patient Account Number: [id number] Date of Birth/Gender: 12-Apr-1946 (74 y.o. M) Treating RN: Montey Hora Primary Care Physician: [name] Other Clinician: Referring Physician: [name] Treating Physician/Extender: Sharalyn Ink in Treatment: 3 Education Assessment Education Provided To: Patient Education Topics Provided Wound/Skin Impairment: Handouts: Other:  need for culture Methods: Explain/Verbal Responses: State content correctly Electronic Signature(s) Signed: 08/06/2019 4:45:34 PM By: Montey Hora Entered By: Montey Hora on 08/06/2019 14:49:10 Bradley [name] ([id number]) -------------------------------------------------------------------------------- Wound Assessment Details Patient Name: Bradley Loa A. Date of Service: 08/06/2019 1:30  PM Medical Record Number: [id number] Patient Account Number: [id number] Date of Birth/Sex: [birth year] (74 y.o. M) Treating RN: Cornell Barman Primary Care Ravina Milner: [name] Other Clinician: Referring Moet Mikulski: [name] Treating Weslynn Ke/Extender: [name], HOYT Weeks in Treatment: 3 Wound Status Wound Number: 10 Primary Pressure Ulcer Etiology: Wound Location: Sacrum - Midline Wound Status: Open Wounding Event: Pressure Injury Comorbid Anemia, Hypertension, History of pressure wounds, Date Acquired: 06/07/2015 History: Rheumatoid Arthritis, Paraplegia Weeks Of Treatment: 3 Clustered Wound: No Photos Wound Measurements Length: (cm) 8 Width: (cm) 13 Depth: (cm) 5.4 Area: (cm) 81.681 Volume: (cm) 441.08 % Reduction in Area: -58.5% % Reduction in Volume: -42.7% Epithelialization: None Tunneling: No Undermining: No Wound Description Classification: Category/Stage IV Wound Margin: Distinct, outline attached Exudate Amount: Large Exudate Type: Serosanguineous Exudate Color: red, brown Foul Odor After Cleansing: No Slough/Fibrino Yes Wound Bed Granulation Amount: Large (67-100%) Exposed Structure Granulation Quality: Red Fascia Exposed: No Necrotic Amount: Small (1-33%) Fat Layer (Subcutaneous Tissue) Exposed: Yes Necrotic Quality: Adherent Slough Tendon Exposed: No Muscle Exposed: No Joint Exposed: No Bone Exposed: No Treatment Notes Wound #10 (Midline Sacrum) Notes dakins moistened gauze, xtrasorb, abd and tape Electronic Signature(s) Signed: 08/07/2019 9:28:34 AM By: Gretta Cool, BSN, RN, CWS, Kim RN, BSN Bradley [name], Bradley [name] [place] ([id number]) Entered By: Gretta Cool, BSN, RN, CWS, Kim on 08/06/2019 13:50:25 Bradley [name] ([id number]) -------------------------------------------------------------------------------- [place] Details Patient Name: Bradley Loa A. Date of Service: 08/06/2019 1:30 PM Medical Record Number: [id number] Patient Account Number: [id number] Date  of Birth/Sex: [birth year] (74 y.o. M) Treating RN: Montey Hora Primary Care Bright Spielmann: [name] Other Clinician: Referring Detrice Cales: [name] Treating Carmen Tolliver/Extender: [name], HOYT Weeks in Treatment: 3 Vital Signs Time Taken: 13:36 Temperature (F): 98.4 Pulse (bpm): 90 Respiratory Rate (breaths/min): 16 Blood Pressure (mmHg): 125/72 Reference Range: 80 - 120 mg / dl Electronic Signature(s) Signed: 08/06/2019 4:01:06 PM By: Lorine Bears RCP, RRT, CHT Entered By: Lorine Bears on 08/06/2019 13:39:36

## 2019-08-09 DIAGNOSIS — D509 Iron deficiency anemia, unspecified: Secondary | ICD-10-CM | POA: Diagnosis not present

## 2019-08-09 DIAGNOSIS — I10 Essential (primary) hypertension: Secondary | ICD-10-CM | POA: Diagnosis not present

## 2019-08-09 DIAGNOSIS — J309 Allergic rhinitis, unspecified: Secondary | ICD-10-CM | POA: Diagnosis not present

## 2019-08-09 DIAGNOSIS — L89154 Pressure ulcer of sacral region, stage 4: Secondary | ICD-10-CM | POA: Diagnosis not present

## 2019-08-09 DIAGNOSIS — S24104S Unspecified injury at T11-T12 level of thoracic spinal cord, sequela: Secondary | ICD-10-CM | POA: Diagnosis not present

## 2019-08-09 DIAGNOSIS — N400 Enlarged prostate without lower urinary tract symptoms: Secondary | ICD-10-CM | POA: Diagnosis not present

## 2019-08-09 DIAGNOSIS — Z933 Colostomy status: Secondary | ICD-10-CM | POA: Diagnosis not present

## 2019-08-09 DIAGNOSIS — G8220 Paraplegia, unspecified: Secondary | ICD-10-CM | POA: Diagnosis not present

## 2019-08-09 DIAGNOSIS — N319 Neuromuscular dysfunction of bladder, unspecified: Secondary | ICD-10-CM | POA: Diagnosis not present

## 2019-08-09 LAB — AEROBIC CULTURE W GRAM STAIN (SUPERFICIAL SPECIMEN): Gram Stain: NONE SEEN

## 2019-08-12 DIAGNOSIS — I10 Essential (primary) hypertension: Secondary | ICD-10-CM | POA: Diagnosis not present

## 2019-08-12 DIAGNOSIS — J309 Allergic rhinitis, unspecified: Secondary | ICD-10-CM | POA: Diagnosis not present

## 2019-08-12 DIAGNOSIS — Z933 Colostomy status: Secondary | ICD-10-CM | POA: Diagnosis not present

## 2019-08-12 DIAGNOSIS — N400 Enlarged prostate without lower urinary tract symptoms: Secondary | ICD-10-CM | POA: Diagnosis not present

## 2019-08-12 DIAGNOSIS — N319 Neuromuscular dysfunction of bladder, unspecified: Secondary | ICD-10-CM | POA: Diagnosis not present

## 2019-08-12 DIAGNOSIS — G8220 Paraplegia, unspecified: Secondary | ICD-10-CM | POA: Diagnosis not present

## 2019-08-12 DIAGNOSIS — L89154 Pressure ulcer of sacral region, stage 4: Secondary | ICD-10-CM | POA: Diagnosis not present

## 2019-08-12 DIAGNOSIS — D509 Iron deficiency anemia, unspecified: Secondary | ICD-10-CM | POA: Diagnosis not present

## 2019-08-12 DIAGNOSIS — S24104S Unspecified injury at T11-T12 level of thoracic spinal cord, sequela: Secondary | ICD-10-CM | POA: Diagnosis not present

## 2019-08-13 ENCOUNTER — Other Ambulatory Visit: Payer: Self-pay

## 2019-08-13 ENCOUNTER — Encounter: Payer: Medicare HMO | Admitting: Physician Assistant

## 2019-08-13 DIAGNOSIS — Z933 Colostomy status: Secondary | ICD-10-CM | POA: Diagnosis not present

## 2019-08-13 DIAGNOSIS — L89154 Pressure ulcer of sacral region, stage 4: Secondary | ICD-10-CM | POA: Diagnosis not present

## 2019-08-13 DIAGNOSIS — D509 Iron deficiency anemia, unspecified: Secondary | ICD-10-CM | POA: Diagnosis not present

## 2019-08-13 DIAGNOSIS — M069 Rheumatoid arthritis, unspecified: Secondary | ICD-10-CM | POA: Diagnosis not present

## 2019-08-13 DIAGNOSIS — M8668 Other chronic osteomyelitis, other site: Secondary | ICD-10-CM | POA: Diagnosis not present

## 2019-08-13 DIAGNOSIS — L89153 Pressure ulcer of sacral region, stage 3: Secondary | ICD-10-CM | POA: Diagnosis not present

## 2019-08-13 DIAGNOSIS — G8220 Paraplegia, unspecified: Secondary | ICD-10-CM | POA: Diagnosis not present

## 2019-08-13 NOTE — Progress Notes (Addendum)
SAMI, BLUNK ([id number]) Visit Report for 08/13/2019 Arrival Information Details Patient Name: CORVON, KEMPKER. Date of Service: 08/13/2019 3:00 PM Medical Record Number: [id number] Patient Account Number: [id number] Date of Birth/Sex: 1945/07/27 (74 y.o. M) Treating RN: Montey Hora Primary Care Kalen Neidert: [name] Other Clinician: Referring Johnedward Brodrick: [name] Treating Mcadoo Muzquiz/Extender: [name], HOYT Weeks in Treatment: 4 Visit Information History Since Last Visit Added or deleted any medications: No Patient Arrived: Wheel Chair Any new allergies or adverse reactions: No Arrival Time: 15:13 Had a fall or experienced change in No Accompanied By: self activities of daily living that may affect Transfer Assistance: Harrel Lemon Lift risk of falls: Patient Identification Verified: Yes Signs or symptoms of abuse/neglect since last visito No Secondary Verification Process Completed: Yes Hospitalized since last visit: No Implantable device outside of the clinic excluding No cellular tissue based products placed in the center since last visit: Has Dressing in Place as Prescribed: Yes Pain Present Now: No Electronic Signature(s) Signed: 08/13/2019 4:21:39 PM By: Lorine Bears RCP, RRT, CHT Entered By: Lorine Bears on 08/13/2019 15:14:22 [name] -------------------------------------------------------------------------------- Clinic Level of Care Assessment Details Patient Name: [name] Date of Service: 08/13/2019 3:00 PM Medical Record Number: [id number] Patient Account Number: [id number] Date of Birth/Sex: Apr 29, 1946 (74 y.o. M) Treating RN: Montey Hora Primary Care Marte Celani: [name] Other Clinician: Referring Dakarai Mcglocklin: [name] Treating Yudith Norlander/Extender: [name], HOYT Weeks in Treatment: 4 Clinic Level of Care Assessment Items TOOL 4 Quantity Score []  - Use when only an EandM is  performed on FOLLOW-UP visit 0 ASSESSMENTS - Nursing Assessment / Reassessment X - Reassessment of Co-morbidities (includes updates in patient status) 1 10 X- 1 5 Reassessment of Adherence to Treatment Plan ASSESSMENTS - Wound and Skin Assessment / Reassessment X - Simple Wound Assessment / Reassessment - one wound 1 5 []  - 0 Complex Wound Assessment / Reassessment - multiple wounds []  - 0 Dermatologic / Skin Assessment (not related to wound area) ASSESSMENTS - Focused Assessment []  - Circumferential Edema Measurements - multi extremities 0 []  - 0 Nutritional Assessment / Counseling / Intervention []  - 0 Lower Extremity Assessment (monofilament, tuning fork, pulses) []  - 0 Peripheral Arterial Disease Assessment (using hand held doppler) ASSESSMENTS - Ostomy and/or Continence Assessment and Care []  - Incontinence Assessment and Management 0 []  - 0 Ostomy Care Assessment and Management (repouching, etc.) PROCESS - Coordination of Care X - Simple Patient / Family Education for ongoing care 1 15 []  - 0 Complex (extensive) Patient / Family Education for ongoing care X- 1 10 Staff obtains Programmer, systems, Records, Test Results / Process Orders []  - 0 Staff telephones HHA, Nursing Homes / Clarify orders / etc []  - 0 Routine Transfer to another Facility (non-emergent condition) []  - 0 Routine Hospital Admission (non-emergent condition) []  - 0 New Admissions / Biomedical engineer / Ordering NPWT, Apligraf, etc. []  - 0 Emergency Hospital Admission (emergent condition) X- 1 10 Simple Discharge Coordination []  - 0 Complex (extensive) Discharge Coordination PROCESS - Special Needs []  - Pediatric / Minor Patient Management 0 []  - 0 Isolation Patient Management []  - 0 Hearing / Language / Visual special needs []  - 0 Assessment of Community assistance (transportation, D/C planning, etc.) OLUWATOSIN, CARROLL A. ([id number]) []  - 0 Additional assistance / Altered mentation []  - 0 Support  Surface(s) Assessment (bed, cushion, seat, etc.) INTERVENTIONS - Wound Cleansing / Measurement X - Simple Wound Cleansing - one wound 1 5 []  - 0 Complex Wound Cleansing -  multiple wounds X- 1 5 Wound Imaging (photographs - any number of wounds) []  - 0 Wound Tracing (instead of photographs) X- 1 5 Simple Wound Measurement - one wound []  - 0 Complex Wound Measurement - multiple wounds INTERVENTIONS - Wound Dressings X - Small Wound Dressing one or multiple wounds 1 10 []  - 0 Medium Wound Dressing one or multiple wounds []  - 0 Large Wound Dressing one or multiple wounds X- 1 5 Application of Medications - topical []  - 0 Application of Medications - injection INTERVENTIONS - Miscellaneous []  - External ear exam 0 []  - 0 Specimen Collection (cultures, biopsies, blood, body fluids, etc.) []  - 0 Specimen(s) / Culture(s) sent or taken to Lab for analysis X- 1 10 Patient Transfer (multiple staff / Civil Service fast streamer / Similar devices) []  - 0 Simple Staple / Suture removal (25 or less) []  - 0 Complex Staple / Suture removal (26 or more) []  - 0 Hypo / Hyperglycemic Management (close monitor of Blood Glucose) []  - 0 Ankle / Brachial Index (ABI) - do not check if billed separately X- 1 5 Vital Signs Has the patient been seen at the hospital within the last three years: Yes Total Score: 100 Level Of Care: New/Established - Level 3 Electronic Signature(s) Signed: 08/13/2019 4:36:26 PM By: Montey Hora Entered By: Montey Hora on 08/13/2019 15:38:21 [name] -------------------------------------------------------------------------------- Encounter Discharge Information Details Patient Name: [name] Date of Service: 08/13/2019 3:00 PM Medical Record Number: [id number] Patient Account Number: [id number] Date of Birth/Sex: [birth year] (74 y.o. M) Treating RN: Montey Hora Primary Care Oluwatobi Ruppe: [name] Other Clinician: Referring Miaa Latterell:  [name] Treating Anahita Cua/Extender: [name], HOYT Weeks in Treatment: 4 Encounter Discharge Information Items Discharge Condition: Stable Ambulatory Status: Wheelchair Discharge Destination: Home Transportation: Private Auto Accompanied By: self Schedule Follow-up Appointment: Yes Clinical Summary of Care: Electronic Signature(s) Signed: 08/13/2019 4:36:26 PM By: Montey Hora Entered By: Montey Hora on 08/13/2019 15:40:05 [name] -------------------------------------------------------------------------------- Lower Extremity Assessment Details Patient Name: [name] Date of Service: 08/13/2019 3:00 PM Medical Record Number: [id number] Patient Account Number: [id number] Date of Birth/Sex: 1945-09-19 (74 y.o. M) Treating RN: Army Melia Primary Care Jule Whitsel: [name] Other Clinician: Referring Haidyn Chadderdon: [name] Treating Gurtej Noyola/Extender: [name], HOYT Weeks in Treatment: 4 Electronic Signature(s) Signed: 08/14/2019 8:09:31 AM By: Army Melia Entered By: Army Melia on 08/13/2019 15:22:28 [name] -------------------------------------------------------------------------------- Multi Wound Chart Details Patient Name: [name] Date of Service: 08/13/2019 3:00 PM Medical Record Number: [id number] Patient Account Number: [id number] Date of Birth/Sex: Dec 27, 1945 (74 y.o. M) Treating RN: Montey Hora Primary Care Noel Henandez: [name] Other Clinician: Referring Deniz Hannan: [name] Treating Rochele Lueck/Extender: [name], HOYT Weeks in Treatment: 4 Vital Signs Height(in): Pulse(bpm): 59 Weight(lbs): Blood Pressure(mmHg): 107/69 Body Mass Index(BMI): Temperature(F): 98.5 Respiratory Rate(breaths/min): 16 Photos: [N/A:N/A] Wound Location: Sacrum - Midline N/A N/A Wounding Event: Pressure Injury N/A N/A Primary Etiology: Pressure Ulcer N/A N/A Comorbid History: Anemia,  Hypertension, History of N/A N/A pressure wounds, Rheumatoid Arthritis, Paraplegia Date Acquired: 06/07/2015 N/A N/A Weeks of Treatment: 4 N/A N/A Wound Status: Open N/A N/A Measurements L x W x D (cm) 8x9.5x4 N/A N/A Area (cm) : 59.69 N/A N/A Volume (cm) : 238.761 N/A N/A % Reduction in Area: -15.90% N/A N/A % Reduction in Volume: 22.80% N/A N/A Classification: Category/Stage IV N/A N/A Exudate Amount: Large N/A N/A Exudate Type: Serosanguineous N/A N/A Exudate Color: red, brown N/A N/A Wound Margin: Distinct, outline attached N/A N/A Granulation  Amount: Large (67-100%) N/A N/A Granulation Quality: Red N/A N/A Necrotic Amount: Small (1-33%) N/A N/A Exposed Structures: Fat Layer (Subcutaneous Tissue) N/A N/A Exposed: Yes Fascia: No Tendon: No Muscle: No Joint: No Bone: No Epithelialization: None N/A N/A Treatment Notes Electronic Signature(s) Signed: 08/13/2019 4:36:26 PM By: Montey Hora Entered By: Montey Hora on 08/13/2019 15:36:07 [name] DURON, HUCKLEBY ([id number]) -------------------------------------------------------------------------------- [place] Details Patient Name: JEROL, ELZA A. Date of Service: 08/13/2019 3:00 PM Medical Record Number: [id number] Patient Account Number: [id number] Date of Birth/Sex: 06-19-1945 (74 y.o. M) Treating RN: Montey Hora Primary Care Teckla Christiansen: [name] Other Clinician: Referring Oiva Dibari: [name] Treating Raniya Golembeski/Extender: [name], HOYT Weeks in Treatment: 4 Active Inactive Abuse / Safety / Falls / Self Care Management Nursing Diagnoses: Impaired physical mobility Goals: Patient will not develop complications from immobility Date Initiated: 07/16/2019 Target Resolution Date: 10/12/2019 Goal Status: Active Interventions: Assess Activities of Daily Living upon admission and as needed Notes: Orientation to the Wound Care Program Nursing  Diagnoses: Knowledge deficit related to the wound healing center program Goals: Patient/caregiver will verbalize understanding of the [facility] Program Date Initiated: 07/16/2019 Target Resolution Date: 10/12/2019 Goal Status: Active Interventions: Provide education on orientation to the wound center Notes: Pressure Nursing Diagnoses: Knowledge deficit related to causes and risk factors for pressure ulcer development Goals: Patient will remain free from development of additional pressure ulcers Date Initiated: 07/16/2019 Target Resolution Date: 10/12/2019 Goal Status: Active Interventions: Provide education on pressure ulcers Notes: Wound/Skin Impairment Nursing Diagnoses: Impaired tissue integrity HENNING, RUPARD ([id number]) Goals: Ulcer/skin breakdown will heal within 14 weeks Date Initiated: 07/16/2019 Target Resolution Date: 10/12/2019 Goal Status: Active Interventions: Assess patient/caregiver ability to obtain necessary supplies Assess patient/caregiver ability to perform ulcer/skin care regimen upon admission and as needed Assess ulceration(s) every visit Notes: Electronic Signature(s) Signed: 08/13/2019 4:36:26 PM By: Montey Hora Entered By: Montey Hora on 08/13/2019 15:35:59 [name] -------------------------------------------------------------------------------- Pain Assessment Details Patient Name: [name] Date of Service: 08/13/2019 3:00 PM Medical Record Number: [id number] Patient Account Number: [id number] Date of Birth/Sex: 12-19-1945 (74 y.o. M) Treating RN: Army Melia Primary Care Nahshon Reich: [name] Other Clinician: Referring Camauri Craton: [name] Treating Earlena Werst/Extender: [name], HOYT Weeks in Treatment: 4 Active Problems Location of Pain Severity and Description of Pain Patient Has Paino No Site Locations Pain Management and Medication Current Pain Management: Electronic  Signature(s) Signed: 08/14/2019 8:09:31 AM By: Army Melia Entered By: Army Melia on 08/13/2019 15:20:05 [name] -------------------------------------------------------------------------------- Patient/Caregiver Education Details Patient Name: [name] Date of Service: 08/13/2019 3:00 PM Medical Record Number: [id number] Patient Account Number: [id number] Date of Birth/Gender: 1945/06/09 (74 y.o. M) Treating RN: Montey Hora Primary Care Physician: [name] Other Clinician: Referring Physician: [name] Treating Physician/Extender: Sharalyn Ink in Treatment: 4 Education Assessment Education Provided To: Patient Education Topics Provided Wound/Skin Impairment: Handouts: Other: wound care to continue Methods: Explain/Verbal Responses: State content correctly Electronic Signature(s) Signed: 08/13/2019 4:36:26 PM By: Montey Hora Entered By: Montey Hora on 08/13/2019 15:39:28 [name] -------------------------------------------------------------------------------- Wound Assessment Details Patient Name: [name] Date of Service: 08/13/2019 3:00 PM Medical Record Number: [id number] Patient Account Number: [id number] Date of Birth/Sex: 04/13/1946 (74 y.o. M) Treating RN: Army Melia Primary Care Adal Sereno: [name] Other Clinician: Referring Dorsey Charette: [name] Treating Esperanza Madrazo/Extender: [name], HOYT Weeks in Treatment: 4 Wound Status Wound Number: 10 Primary Pressure Ulcer Etiology: Wound Location: Sacrum - Midline Wound Status: Open  Wounding Event: Pressure Injury Comorbid Anemia, Hypertension, History of pressure wounds, Date Acquired: 06/07/2015 History: Rheumatoid Arthritis, Paraplegia Weeks Of Treatment: 4 Clustered Wound: No Photos Wound Measurements Length: (cm) 8 % Redu Width: (cm) 9.5 % Redu Depth: (cm) 4 Epithe Area: (cm) 59.69 [place] Volume: (cm)  238.761 Under ction in Area: -15.9% ction in Volume: 22.8% lialization: None ling: No mining: No Wound Description Classification: Category/Stage IV Foul O Wound Margin: Distinct, outline attached Slough Exudate Amount: Large Exudate Type: Serosanguineous Exudate Color: red, brown dor After Cleansing: No /Fibrino Yes Wound Bed Granulation Amount: Large (67-100%) Exposed Structure Granulation Quality: Red Fascia Exposed: No Necrotic Amount: Small (1-33%) Fat Layer (Subcutaneous Tissue) Exposed: Yes Necrotic Quality: Adherent Slough Tendon Exposed: No Muscle Exposed: No Joint Exposed: No Bone Exposed: No Treatment Notes Wound #10 (Midline Sacrum) Notes dakins moistened gauze, xtrasorb, abd and tape Electronic Signature(s) Signed: 08/14/2019 8:09:31 AM By: Jerry Caras ([id number]) Entered By: Army Melia on 08/13/2019 15:22:08 [name] -------------------------------------------------------------------------------- Vitals Details Patient Name: [name] Date of Service: 08/13/2019 3:00 PM Medical Record Number: [id number] Patient Account Number: [id number] Date of Birth/Sex: Oct 15, 1945 (74 y.o. M) Treating RN: Montey Hora Primary Care Loral Campi: [name] Other Clinician: Referring Francella Barnett: [name] Treating Tranesha Lessner/Extender: [name], HOYT Weeks in Treatment: 4 Vital Signs Time Taken: 15:12 Temperature (F): 98.5 Pulse (bpm): 84 Respiratory Rate (breaths/min): 16 Blood Pressure (mmHg): 107/69 Reference Range: 80 - 120 mg / dl Electronic Signature(s) Signed: 08/13/2019 4:21:39 PM By: Lorine Bears RCP, RRT, CHT Entered By: Lorine Bears on 08/13/2019 15:18:14

## 2019-08-13 NOTE — Progress Notes (Addendum)
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 closure. Although I do not have current access to her note in care everywhere this is likely due to exposed bone. Again according to the patient they did a bone biopsy. He is still using a variant of Dakin solution changing twice a day. He has home health. The patient is not able to give me a firm answer about how long he spends on this in his wheelchair The patient also states that Dr. Myriam Jacobson wanted to reconsider a wound VAC. I really don't see this as a viable option at least not in the outpatient setting. The wound itself is frankly to close to his anal opening to maintain a seal. The last time we tried to do this home health was unable to manage it. It might be possible to maintain a wound VAC in this setting outside of the home such as a skilled nursing facility or an [place] however I am doubtful about this even in that  setting **** READMISSION 09/21/17-He is here for evaluation of stage IV sacral ulcer. Since his last evaluation here in December he has completed treatment for sacral osteomyelitis. He was at [facility] for IV therapy Bradley NPWT dressing changes. He was discharged, with home health services, in February. He admits that while in the skilled facility he had "80%" success with maintaining dressing, since discharge he has had approximately "40%" success with maintaining wound VAC dressing. We discussed at length that this is not a safe or recommended option. We will apply Dakin's wet to dry dressing daily Bradley he will follow-up next week. He is accompanied today by his sister who is willing to assist in dressing changes; they will discuss the social issue as he feels he is capable of changing dressing daily when home health is not able. 09/28/17-He is here in follow-up evaluation for stage IV sacral pressure ulcer. He has been using the Dakin's wet-to-dry daily; he continues with home health. He is not accompanied by anyone  at this visit. He will follow up in two weeks per his request/preference. 10/12/17 on evaluation today patient appears to be doing very well. The Dakin solution went to dry packings do seem to be helping him as far as the sacral wound is concerned I'm not seeing anything that has me more concerned as far as infection or otherwise is concerned. Overall I'm pleased with the appearance of the wound. 10/26/17-He is here in follow up evaluation for a stage IV sacral ulcer. He continues with daily Dakin's wet-to-dry. He is voicing no complaints or concerns. He will follow-up in 2 weeks 11/16/17-He is here for follow up evaluation for a palliative stage IV sacral pressure ulcer. We will continue with Dakin's wet-to-dry. He will follow-up in 4 weeks. He is expressing concern/complaint regarding new bed that has arrived, stating he is unable to manipulate/maneuver it due to the bed  crank being at the foot of the bed. 12/14/17-He is here for evaluation for palliative stage IV sacral ulcer. He is voicing no complaints or concerns. We will continue with one-to-one ratio of saline Bradley dakins. He will follow-up in 3 weeks 01/04/18-He is here in follow-up evaluation for palliative stage IV sacral ulcer. He is inquiring about reinstating the negative pressure wound therapy. We discussed at length that the negative pressure wound therapy in the home setting has not been successful for him repeatedly with loss of cereal Bradley unavailability of 24/7 help; reminded him that home health is not available 24/7 when loss of seal occurs. He does verbalize understanding to this Bradley does not pursue. We also discussed the palliative nature of this ulcer (given no significant change/improvement in measurement/appearance, not a candidate for muscle flap per plastic surgery, Bradley continued independent living) Bradley that the goal is for maintenance, decrease in infection Bradley minimizing/avoiding deterioration given that he is independent in his care, does not have home health Bradley requires daily dressing changes secondary to drainage amount. He is inquiring about a wound clinic in [facility], I have informed him that I am unfamiliar with that clinic but that he is encouraged to seek another opinion if that is his desire. We will continue with dakins Bradley he will follow up in three weeks 01/25/18-He is here in follow-up evaluation for palliative stage IV sacral ulcer. He continues with Dakin's/saline 1:1 mixture wet to dry dressing changes. He states he has an appointment at [facility] on 9/17 for evaluation of surgical intervention/closure of the sacral ulcer. He will follow-up here in 4 weeks Readmission: 07/16/2019 upon evaluation today patient appears to be doing really about the same as when he was previously seen here in the wound care center. He most recently was a patient of [facility] back when she was still working here in  the center but had been referred to [facility] for consideration of a flap. With that being said the surgeon there at [facility] stated that this was too large for her flap Bradley they have been attempting to get this smaller in order to be able to proceed with a flap. Nonetheless unfortunately he has had a cycle of going back Bradley forth between the osteomyelitis flaring Bradley then sent him back Bradley then making a little bit of progress only to be sent back again. It sounds like most recently they have been using a Iodoflex type dressing at this point which does not seem to have done any harm by any means. With that being said this wound seems to be quite large for using Iodoflex throughout  Bradley subsequently I think he may do much better with the use of Vioxx moistened gauze which would be safe for the new tissue growing Bradley also keep the wound quite nice Bradley clean. The patient is not opposed to this he in fact states that his home health nurse had mentioned this as a [place]. ([id number]) possibility as well. 07/23/2019 upon evaluation today patient actually appears to be doing very well in regard to his sacral wound. In fact this is much healthier the measurements not terribly different but again with a wound like this at home necessarily expect a a lot of change as far as the overall measurements are concerned in just 1 week's time. This is going be a much longer term process at this point. With that being said I do think that he is very healthy appearing as far as the base of the wound is concerned. 08/06/2019 upon evaluation today patient actually appears to be doing about the same with regard to his wound to be honest. There is really not a significant improvement overall based on what I am seeing today. Fortunately there is no signs of significant systemic infection. No fevers, chills, nausea, vomiting, or diarrhea. With that being said there is odor to the drainage from the wound Bradley subsequently also what  appears to be increased drainage based on what we are seeing today as well as what his home health nurse called [place] about that she was concerned with as well. 08/13/2019 upon evaluation today patient appears to be doing about the same at this point with regard to his wound although I think the dressing may be a little less drainage wise compared to what it was previous. Fortunately there is no signs of active infection at this time. No fevers, chills, nausea, vomiting, or diarrhea. He has been taking the antibiotic for only 3 days. Objective Constitutional Well-nourished Bradley well-hydrated in no acute distress. Vitals Time Taken: 3:12 PM, Temperature: 98.5 F, Pulse: 84 bpm, Respiratory Rate: 16 breaths/min, Blood Pressure: 107/69 mmHg. Respiratory normal breathing without difficulty. Psychiatric this patient is able to make decisions Bradley demonstrates good insight into disease process. Alert Bradley Oriented x 3. pleasant Bradley cooperative. General Notes: Patient's wound currently showed signs of good granulation at this time Bradley again I do not see as much drainage in my opinion compared to what previously was noted even after just a few days of therapy. I am going to recommend obviously that he continue with antibiotic as such. Also think the Vashe is doing well for him as far as the moistened gauze packing is concerned. Integumentary (Hair, Skin) Wound #10 status is Open. Original cause of wound was Pressure Injury. The wound is located on the Midline Sacrum. The wound measures 8cm length x 9.5cm width x 4cm depth; 59.69cm^2 area Bradley 238.761cm^3 volume. There is Fat Layer (Subcutaneous Tissue) Exposed exposed. There is no tunneling or undermining noted. There is a large amount of serosanguineous drainage noted. The wound margin is distinct with the outline attached to the wound base. There is large (67-100%) red granulation within the wound bed. There is a small (1-33%) amount of necrotic tissue within  the wound bed including Adherent Slough. Assessment Active Problems ICD-10 Pressure ulcer of sacral region, stage 4 Other chronic osteomyelitis, other site Paraplegia, unspecified Plan REHMAN, Bradley A. ([id number]) Wound Cleansing: Wound #10 Midline Sacrum: Clean wound with Normal Saline. Dial antibacterial soap, wash wounds, rinse Bradley pat dry prior to dressing wounds Primary Wound Dressing:  Wound #10 Midline Sacrum: Other: - Vashe moistened gauze (Dakins in clinic) Secondary Dressing: Wound #10 Midline Sacrum: ABD pad - secure with tape XtraSorb Dressing Change Frequency: Wound #10 Midline Sacrum: Change dressing every day. - Patient's son to change when [facility] does not visit Follow-up Appointments: Return Appointment in 1 week. Off-Loading: Wound #10 Midline Sacrum: Gel wheelchair cushion Turn Bradley reposition every 2 hours Home Health: Wound #10 Midline Sacrum: [place] Nurse may visit PRN to address patient s wound care needs. FACE TO FACE ENCOUNTER: MEDICARE Bradley MEDICAID PATIENTS: I certify that this patient is under my care Bradley that I had a face-to-face encounter that meets the physician face-to-face encounter requirements with this patient on this date. The encounter with the patient was in whole or in part for the following MEDICAL CONDITION: (primary reason for [place]) MEDICAL NECESSITY: I certify, that based on my findings, NURSING services are a medically necessary home health service. HOME BOUND STATUS: I certify that my clinical findings support that this patient is homebound (i.e., Due to illness or injury, pt requires aid of supportive devices such as crutches, cane, wheelchairs, walkers, the use of special transportation or the assistance of another person to leave their place of residence. There is a normal inability to leave the home Bradley doing so requires considerable Bradley taxing effort. Other absences are for medical reasons  / religious services Bradley are infrequent or of short duration when for other reasons). If current dressing causes regression in wound condition, may D/C ordered dressing product/s Bradley apply Normal Saline Moist Dressing daily until next [place] / Other MD appointment. [place] of regression in wound condition at [phone]. Please direct any NON-WOUND related issues/requests for orders to patient's Primary Care Physician 1. I would recommend that we continue with the Vioxx moistened gauze packing as I feel like that is doing well. 2. I am also going to suggest at this point that we continue with the securing of the dressing with ABD pads Bradley Xtrasorb to help with controlling the drainage. 3. I am also can recommend that he continue with appropriate offloading both with a wheelchair cushion as well as turning Bradley repositioning when in bed in order to prevent any pressure to the midline sacral region. We will see patient back for reevaluation in 1 week here in the clinic. If anything worsens or changes patient will contact our office for additional recommendations. Electronic Signature(s) Signed: 08/13/2019 3:47:23 PM By: Worthy Keeler PA-C Entered By: Worthy Keeler on 08/13/2019 15:47:22 Bradley [name] ([id number]) -------------------------------------------------------------------------------- SuperBill Details Patient Name: Bradley [name] Date of Service: 08/13/2019 Medical Record Number: [id number] Patient Account Number: [id number] Date of Birth/Sex: Nov 15, 1945 (74 y.o. M) Treating RN: Montey Hora Primary Care Provider: [name] Other Clinician: Referring Provider: [name] Treating Provider/Extender: [name], Ahmira Boisselle Weeks in Treatment: 4 Diagnosis Coding ICD-10 Codes Code Description L89.154 Pressure ulcer of sacral region, stage 4 M86.68 Other chronic osteomyelitis, other site G82.20 Paraplegia, unspecified Facility  Procedures CPT4 Code: [id number] Description: 99213 - WOUND CARE VISIT-LEV 3 EST PT Modifier: Quantity: 1 Physician Procedures CPT4 Code: [id number] Description: [id number] - WC PHYS LEVEL 4 - EST PT Modifier: Quantity: 1 CPT4 Code: Description: ICD-10 Diagnosis Description L89.154 Pressure ulcer of sacral region, stage 4 M86.68 Other chronic osteomyelitis, other site G82.20 Paraplegia, unspecified Modifier: Quantity: Electronic Signature(s) Signed: 08/13/2019 3:47:40 PM By: Worthy Keeler PA-C Entered By: Worthy Keeler on 08/13/2019 15:47:39

## 2019-08-16 DIAGNOSIS — G8220 Paraplegia, unspecified: Secondary | ICD-10-CM | POA: Diagnosis not present

## 2019-08-16 DIAGNOSIS — N400 Enlarged prostate without lower urinary tract symptoms: Secondary | ICD-10-CM | POA: Diagnosis not present

## 2019-08-16 DIAGNOSIS — Z933 Colostomy status: Secondary | ICD-10-CM | POA: Diagnosis not present

## 2019-08-16 DIAGNOSIS — D509 Iron deficiency anemia, unspecified: Secondary | ICD-10-CM | POA: Diagnosis not present

## 2019-08-16 DIAGNOSIS — L89154 Pressure ulcer of sacral region, stage 4: Secondary | ICD-10-CM | POA: Diagnosis not present

## 2019-08-16 DIAGNOSIS — S24104S Unspecified injury at T11-T12 level of thoracic spinal cord, sequela: Secondary | ICD-10-CM | POA: Diagnosis not present

## 2019-08-16 DIAGNOSIS — J309 Allergic rhinitis, unspecified: Secondary | ICD-10-CM | POA: Diagnosis not present

## 2019-08-16 DIAGNOSIS — N319 Neuromuscular dysfunction of bladder, unspecified: Secondary | ICD-10-CM | POA: Diagnosis not present

## 2019-08-16 DIAGNOSIS — I10 Essential (primary) hypertension: Secondary | ICD-10-CM | POA: Diagnosis not present

## 2019-08-19 DIAGNOSIS — D509 Iron deficiency anemia, unspecified: Secondary | ICD-10-CM | POA: Diagnosis not present

## 2019-08-19 DIAGNOSIS — N400 Enlarged prostate without lower urinary tract symptoms: Secondary | ICD-10-CM | POA: Diagnosis not present

## 2019-08-19 DIAGNOSIS — N319 Neuromuscular dysfunction of bladder, unspecified: Secondary | ICD-10-CM | POA: Diagnosis not present

## 2019-08-19 DIAGNOSIS — J309 Allergic rhinitis, unspecified: Secondary | ICD-10-CM | POA: Diagnosis not present

## 2019-08-19 DIAGNOSIS — I10 Essential (primary) hypertension: Secondary | ICD-10-CM | POA: Diagnosis not present

## 2019-08-19 DIAGNOSIS — S24104S Unspecified injury at T11-T12 level of thoracic spinal cord, sequela: Secondary | ICD-10-CM | POA: Diagnosis not present

## 2019-08-19 DIAGNOSIS — L89154 Pressure ulcer of sacral region, stage 4: Secondary | ICD-10-CM | POA: Diagnosis not present

## 2019-08-19 DIAGNOSIS — G8220 Paraplegia, unspecified: Secondary | ICD-10-CM | POA: Diagnosis not present

## 2019-08-19 DIAGNOSIS — Z933 Colostomy status: Secondary | ICD-10-CM | POA: Diagnosis not present

## 2019-08-21 ENCOUNTER — Other Ambulatory Visit: Payer: Self-pay | Admitting: *Deleted

## 2019-08-21 ENCOUNTER — Ambulatory Visit: Payer: Medicare HMO | Admitting: Urology

## 2019-08-21 DIAGNOSIS — N319 Neuromuscular dysfunction of bladder, unspecified: Secondary | ICD-10-CM | POA: Diagnosis not present

## 2019-08-21 DIAGNOSIS — L89154 Pressure ulcer of sacral region, stage 4: Secondary | ICD-10-CM | POA: Diagnosis not present

## 2019-08-21 DIAGNOSIS — J309 Allergic rhinitis, unspecified: Secondary | ICD-10-CM | POA: Diagnosis not present

## 2019-08-21 DIAGNOSIS — G8220 Paraplegia, unspecified: Secondary | ICD-10-CM | POA: Diagnosis not present

## 2019-08-21 DIAGNOSIS — D509 Iron deficiency anemia, unspecified: Secondary | ICD-10-CM | POA: Diagnosis not present

## 2019-08-21 DIAGNOSIS — S24104S Unspecified injury at T11-T12 level of thoracic spinal cord, sequela: Secondary | ICD-10-CM | POA: Diagnosis not present

## 2019-08-21 DIAGNOSIS — N400 Enlarged prostate without lower urinary tract symptoms: Secondary | ICD-10-CM | POA: Diagnosis not present

## 2019-08-21 DIAGNOSIS — I10 Essential (primary) hypertension: Secondary | ICD-10-CM | POA: Diagnosis not present

## 2019-08-21 DIAGNOSIS — Z933 Colostomy status: Secondary | ICD-10-CM | POA: Diagnosis not present

## 2019-08-23 ENCOUNTER — Other Ambulatory Visit: Payer: Self-pay

## 2019-08-23 ENCOUNTER — Encounter: Payer: Medicare HMO | Admitting: Physician Assistant

## 2019-08-23 DIAGNOSIS — L89153 Pressure ulcer of sacral region, stage 3: Secondary | ICD-10-CM | POA: Diagnosis not present

## 2019-08-23 DIAGNOSIS — D509 Iron deficiency anemia, unspecified: Secondary | ICD-10-CM | POA: Diagnosis not present

## 2019-08-23 DIAGNOSIS — M8668 Other chronic osteomyelitis, other site: Secondary | ICD-10-CM | POA: Diagnosis not present

## 2019-08-23 DIAGNOSIS — L89154 Pressure ulcer of sacral region, stage 4: Secondary | ICD-10-CM | POA: Diagnosis not present

## 2019-08-23 DIAGNOSIS — Z933 Colostomy status: Secondary | ICD-10-CM | POA: Diagnosis not present

## 2019-08-23 DIAGNOSIS — G8220 Paraplegia, unspecified: Secondary | ICD-10-CM | POA: Diagnosis not present

## 2019-08-23 DIAGNOSIS — M069 Rheumatoid arthritis, unspecified: Secondary | ICD-10-CM | POA: Diagnosis not present

## 2019-08-23 NOTE — Progress Notes (Signed)
AUBIN, MCCULLICK ([id number]) Visit Report for 08/23/2019 Arrival Information Details Patient Name: Bradley [name], Bradley [name]. Date of Service: 08/23/2019 11:00 AM Medical Record Number: [id number] Patient Account Number: [id number] Date of Birth/Sex: 26-Apr-1946 (74 y.o. M) Treating RN: Montey Hora Primary Care Taggart Prasad: [name] Other Clinician: Referring Krystyne Tewksbury: [name] Treating Marivel Mcclarty/Extender: [name], HOYT Weeks in Treatment: 5 Visit Information History Since Last Visit Added or deleted any medications: No Patient Arrived: Wheel Chair Any new allergies or adverse reactions: No Arrival Time: 10:58 Had a fall or experienced change in No Accompanied By: self activities of daily living that may affect Transfer Assistance: None risk of falls: Patient Identification Verified: Yes Signs or symptoms of abuse/neglect since last visito No Secondary Verification Process Completed: Yes Hospitalized since last visit: No Implantable device outside of the clinic excluding No cellular tissue based products placed in the center since last visit: Has Dressing in Place as Prescribed: Yes Pain Present Now: No Electronic Signature(s) Signed: 08/23/2019 11:59:23 AM By: Lorine Bears RCP, RRT, CHT Entered By: Lorine Bears on 08/23/2019 10:59:57 [name] -------------------------------------------------------------------------------- Clinic Level of Care Assessment Details Patient Name: Bradley Loa A. Date of Service: 08/23/2019 11:00 AM Medical Record Number: [id number] Patient Account Number: [id number] Date of Birth/Sex: 10-02-1945 (74 y.o. M) Treating RN: Montey Hora Primary Care Devarius Nelles: [name] Other Clinician: Referring Anish Vana: [name] Treating Janine Reller/Extender: [name], HOYT Weeks in Treatment: 5 Clinic Level of Care Assessment Items TOOL 4 Quantity Score []  - Use when only an EandM is  performed on FOLLOW-UP visit 0 ASSESSMENTS - Nursing Assessment / Reassessment X - Reassessment of Co-morbidities (includes updates in patient status) 1 10 X- 1 5 Reassessment of Adherence to Treatment Plan ASSESSMENTS - Wound and Skin Assessment / Reassessment X - Simple Wound Assessment / Reassessment - one wound 1 5 []  - 0 Complex Wound Assessment / Reassessment - multiple wounds []  - 0 Dermatologic / Skin Assessment (not related to wound area) ASSESSMENTS - Focused Assessment []  - Circumferential Edema Measurements - multi extremities 0 []  - 0 Nutritional Assessment / Counseling / Intervention []  - 0 Lower Extremity Assessment (monofilament, tuning fork, pulses) []  - 0 Peripheral Arterial Disease Assessment (using hand held doppler) ASSESSMENTS - Ostomy and/or Continence Assessment and Care []  - Incontinence Assessment and Management 0 []  - 0 Ostomy Care Assessment and Management (repouching, etc.) PROCESS - Coordination of Care X - Simple Patient / Family Education for ongoing care 1 15 []  - 0 Complex (extensive) Patient / Family Education for ongoing care X- 1 10 Staff obtains Programmer, systems, Records, Test Results / Process Orders []  - 0 Staff telephones HHA, Nursing Homes / Clarify orders / etc []  - 0 Routine Transfer to another Facility (non-emergent condition) []  - 0 Routine Hospital Admission (non-emergent condition) []  - 0 New Admissions / Biomedical engineer / Ordering NPWT, Apligraf, etc. []  - 0 Emergency Hospital Admission (emergent condition) X- 1 10 Simple Discharge Coordination []  - 0 Complex (extensive) Discharge Coordination PROCESS - Special Needs []  - Pediatric / Minor Patient Management 0 []  - 0 Isolation Patient Management []  - 0 Hearing / Language / Visual special needs []  - 0 Assessment of Community assistance (transportation, D/C planning, etc.) Bradley [name], Bradley A. ([id number]) []  - 0 Additional assistance / Altered mentation X- 1 15 Support  Surface(s) Assessment (bed, cushion, seat, etc.) INTERVENTIONS - Wound Cleansing / Measurement X - Simple Wound Cleansing - one wound 1 5 []  - 0 Complex Wound Cleansing - multiple  wounds X- 1 5 Wound Imaging (photographs - any number of wounds) []  - 0 Wound Tracing (instead of photographs) X- 1 5 Simple Wound Measurement - one wound []  - 0 Complex Wound Measurement - multiple wounds INTERVENTIONS - Wound Dressings X - Small Wound Dressing one or multiple wounds 1 10 []  - 0 Medium Wound Dressing one or multiple wounds []  - 0 Large Wound Dressing one or multiple wounds X- 1 5 Application of Medications - topical []  - 0 Application of Medications - injection INTERVENTIONS - Miscellaneous []  - External ear exam 0 []  - 0 Specimen Collection (cultures, biopsies, blood, body fluids, etc.) []  - 0 Specimen(s) / Culture(s) sent or taken to Lab for analysis X- 1 10 Patient Transfer (multiple staff / Civil Service fast streamer / Similar devices) []  - 0 Simple Staple / Suture removal (25 or less) []  - 0 Complex Staple / Suture removal (26 or more) []  - 0 Hypo / Hyperglycemic Management (close monitor of Blood Glucose) []  - 0 Ankle / Brachial Index (ABI) - do not check if billed separately X- 1 5 Vital Signs Has the patient been seen at the hospital within the last three years: Yes Total Score: 115 Level Of Care: New/Established - Level 3 Electronic Signature(s) Signed: 08/23/2019 4:39:31 PM By: Montey Hora Entered By: Montey Hora on 08/23/2019 11:43:24 [name] -------------------------------------------------------------------------------- Encounter Discharge Information Details Patient Name: Bradley Loa A. Date of Service: 08/23/2019 11:00 AM Medical Record Number: [id number] Patient Account Number: [id number] Date of Birth/Sex: March 06, 1946 (74 y.o. M) Treating RN: Montey Hora Primary Care Karle Desrosier: [name] Other Clinician: Referring Broady Lafoy:  [name] Treating Darious Rehman/Extender: [name], HOYT Weeks in Treatment: 5 Encounter Discharge Information Items Discharge Condition: Stable Ambulatory Status: Wheelchair Discharge Destination: Home Transportation: Private Auto Accompanied By: self Schedule Follow-up Appointment: Yes Clinical Summary of Care: Electronic Signature(s) Signed: 08/23/2019 11:43:56 AM By: Montey Hora Entered By: Montey Hora on 08/23/2019 11:43:56 [name] -------------------------------------------------------------------------------- Lower Extremity Assessment Details Patient Name: Bradley Loa A. Date of Service: 08/23/2019 11:00 AM Medical Record Number: [id number] Patient Account Number: [id number] Date of Birth/Sex: 07-Feb-1946 (74 y.o. M) Treating RN: Cornell Barman Primary Care Addisyn Leclaire: [name] Other Clinician: Referring Roben Tatsch: [name] Treating Pria Klosinski/Extender: Sharalyn Ink in Treatment: 5 Electronic Signature(s) Signed: 08/23/2019 5:21:44 PM By: Gretta Cool, BSN, RN, CWS, Kim RN, BSN Entered By: Gretta Cool, BSN, RN, CWS, Kim on 08/23/2019 11:14:53 [name] -------------------------------------------------------------------------------- Multi Wound Chart Details Patient Name: Bradley [name], Bradley A. Date of Service: 08/23/2019 11:00 AM Medical Record Number: [id number] Patient Account Number: [id number] Date of Birth/Sex: [birth year] (74 y.o. M) Treating RN: Cornell Barman Primary Care Alanny Rivers: [name] Other Clinician: Referring Austina Constantin: [name] Treating Arine Foley/Extender: [name], HOYT Weeks in Treatment: 5 Vital Signs Height(in): Pulse(bpm): 79 Weight(lbs): Blood Pressure(mmHg): 121/63 Body Mass Index(BMI): Temperature(F): 98.7 Respiratory Rate(breaths/min): 18 Photos: [N/A:N/A] Wound Location: Sacrum - Midline N/A N/A Wounding Event: Pressure Injury N/A N/A Primary Etiology: Pressure Ulcer  N/A N/A Comorbid History: Anemia, Hypertension, History of N/A N/A pressure wounds, Rheumatoid Arthritis, Paraplegia Date Acquired: 06/07/2015 N/A N/A Weeks of Treatment: 5 N/A N/A Wound Status: Open N/A N/A Measurements L x W x D (cm) 11x13x3.5 N/A N/A Area (cm) : 112.312 N/A N/A Volume (cm) : 393.092 N/A N/A % Reduction in Area: -118.00% N/A N/A % Reduction in Volume: -27.20% N/A N/A Classification: Category/Stage IV N/A N/A Exudate Amount: Large N/A N/A Exudate Type: Serosanguineous N/A N/A Exudate Color: red, brown N/A N/A Wound  Margin: Epibole N/A N/A Granulation Amount: Large (67-100%) N/A N/A Granulation Quality: Red N/A N/A Necrotic Amount: Small (1-33%) N/A N/A Exposed Structures: Fat Layer (Subcutaneous Tissue) N/A N/A Exposed: Yes Fascia: No Tendon: No Muscle: No Joint: No Bone: No Epithelialization: None N/A N/A Treatment Notes Wound #10 (Midline Sacrum) Notes dakins moistened gauze, xtrasorb, abd and tape Bradley [name], Bradley [name] ([id number]) Electronic Signature(s) Signed: 08/23/2019 5:21:44 PM By: Gretta Cool, BSN, RN, CWS, Kim RN, BSN Previous Signature: 08/23/2019 11:42:08 AM Version By: Montey Hora Entered By: Gretta Cool BSN, RN, CWS, Kim on 08/23/2019 11:44:52 [name] -------------------------------------------------------------------------------- [place] Details Patient Name: Bradley [name], Bradley A. Date of Service: 08/23/2019 11:00 AM Medical Record Number: [id number] Patient Account Number: [id number] Date of Birth/Sex: [birth year] (74 y.o. M) Treating RN: Cornell Barman Primary Care Hiroto Saltzman: [name] Other Clinician: Referring Florinda Taflinger: [name] Treating Estrella Alcaraz/Extender: [name], HOYT Weeks in Treatment: 5 Active Inactive Abuse / Safety / Falls / Self Care Management Nursing Diagnoses: Impaired physical mobility Goals: Patient will not develop complications from immobility Date Initiated: 07/16/2019 Target  Resolution Date: 10/12/2019 Goal Status: Active Interventions: Assess Activities of Daily Living upon admission and as needed Notes: Orientation to the Wound Care Program Nursing Diagnoses: Knowledge deficit related to the wound healing center program Goals: Patient/caregiver will verbalize understanding of the [facility] Program Date Initiated: 07/16/2019 Target Resolution Date: 10/12/2019 Goal Status: Active Interventions: Provide education on orientation to the wound center Notes: Pressure Nursing Diagnoses: Knowledge deficit related to causes and risk factors for pressure ulcer development Goals: Patient will remain free from development of additional pressure ulcers Date Initiated: 07/16/2019 Target Resolution Date: 10/12/2019 Goal Status: Active Interventions: Provide education on pressure ulcers Notes: Wound/Skin Impairment Nursing Diagnoses: Impaired tissue integrity Bradley [name], Bradley [name] ([id number]) Goals: Ulcer/skin breakdown will heal within 14 weeks Date Initiated: 07/16/2019 Target Resolution Date: 10/12/2019 Goal Status: Active Interventions: Assess patient/caregiver ability to obtain necessary supplies Assess patient/caregiver ability to perform ulcer/skin care regimen upon admission and as needed Assess ulceration(s) every visit Notes: Electronic Signature(s) Signed: 08/23/2019 5:21:44 PM By: Gretta Cool, BSN, RN, CWS, Kim RN, BSN Entered By: Gretta Cool, BSN, RN, CWS, Kim on 08/23/2019 11:39:12 [name] -------------------------------------------------------------------------------- Pain Assessment Details Patient Name: Bradley Loa A. Date of Service: 08/23/2019 11:00 AM Medical Record Number: [id number] Patient Account Number: [id number] Date of Birth/Sex: 23-Sep-1945 (74 y.o. M) Treating RN: Cornell Barman Primary Care Shamari Lofquist: [name] Other Clinician: Referring Clairessa Boulet: [name] Treating Ladarrious Kirksey/Extender: [name], HOYT Weeks  in Treatment: 5 Active Problems Location of Pain Severity and Description of Pain Patient Has Paino No Site Locations Pain Management and Medication Current Pain Management: Notes Patient denies pain at this time Electronic Signature(s) Signed: 08/23/2019 5:21:44 PM By: Gretta Cool, BSN, RN, CWS, Kim RN, BSN Entered By: Gretta Cool, BSN, RN, CWS, Kim on 08/23/2019 11:12:16 [name] -------------------------------------------------------------------------------- Patient/Caregiver Education Details Patient Name: Bradley Loa A. Date of Service: 08/23/2019 11:00 AM Medical Record Number: [id number] Patient Account Number: [id number] Date of Birth/Gender: 10/17/1945 (74 y.o. M) Treating RN: Montey Hora Primary Care Physician: [name] Other Clinician: Referring Physician: [name] Treating Physician/Extender: Sharalyn Ink in Treatment: 5 Education Assessment Education Provided To: Patient Education Topics Provided Offloading: Handouts: Other: offloading wound Methods: Explain/Verbal Responses: State content correctly Electronic Signature(s) Signed: 08/23/2019 4:39:31 PM By: Montey Hora Entered By: Montey Hora on 08/23/2019 11:42:59 [name] -------------------------------------------------------------------------------- Wound Assessment Details Patient Name: Bradley Loa A. Date of Service: 08/23/2019 11:00 AM Medical Record  Number: [id number] Patient Account Number: [id number] Date of Birth/Sex: [birth year] (74 y.o. M) Treating RN: Cornell Barman Primary Care Brelyn Woehl: [name] Other Clinician: Referring Avonell Lenig: [name] Treating Bradley [name]/Extender: [name], HOYT Weeks in Treatment: 5 Wound Status Wound Number: 10 Primary Pressure Ulcer Etiology: Wound Location: Sacrum - Midline Wound Status: Open Wounding Event: Pressure Injury Comorbid Anemia, Hypertension, History of pressure  wounds, Date Acquired: 06/07/2015 History: Rheumatoid Arthritis, Paraplegia Weeks Of Treatment: 5 Clustered Wound: No Photos Wound Measurements Length: (cm) 11 Width: (cm) 13 Depth: (cm) 3.5 Area: (cm) 112.312 Volume: (cm) 393.092 % Reduction in Area: -118% % Reduction in Volume: -27.2% Epithelialization: None Tunneling: No Undermining: No Wound Description Classification: Category/Stage IV Wound Margin: Epibole Exudate Amount: Large Exudate Type: Serosanguineous Exudate Color: red, brown Foul Odor After Cleansing: No Slough/Fibrino Yes Wound Bed Granulation Amount: Large (67-100%) Exposed Structure Granulation Quality: Red Fascia Exposed: No Necrotic Amount: Small (1-33%) Fat Layer (Subcutaneous Tissue) Exposed: Yes Necrotic Quality: Adherent Slough Tendon Exposed: No Muscle Exposed: No Joint Exposed: No Bone Exposed: No Treatment Notes Wound #10 (Midline Sacrum) Notes dakins moistened gauze, xtrasorb, abd and tape Electronic Signature(s) Signed: 08/23/2019 5:21:44 PM By: Gretta Cool, BSN, RN, CWS, Kim RN, BSN Bradley [name], Bradley [name] Kitchen ([id number]) Entered By: Gretta Cool, BSN, RN, CWS, Kim on 08/23/2019 11:14:40 [name] -------------------------------------------------------------------------------- [place] Details Patient Name: Bradley Loa A. Date of Service: 08/23/2019 11:00 AM Medical Record Number: [id number] Patient Account Number: [id number] Date of Birth/Sex: 1946/02/11 (74 y.o. M) Treating RN: Montey Hora Primary Care Kahlee Metivier: [name] Other Clinician: Referring Lashea Goda: [name] Treating Shakesha Soltau/Extender: [name], HOYT Weeks in Treatment: 5 Vital Signs Time Taken: 11:00 Temperature (F): 98.7 Pulse (bpm): 85 Respiratory Rate (breaths/min): 18 Blood Pressure (mmHg): 121/63 Reference Range: 80 - 120 mg / dl Electronic Signature(s) Signed: 08/23/2019 11:59:23 AM By: Lorine Bears RCP, RRT, CHT Entered By:  Becky Sax, Amado Nash on 08/23/2019 11:01:22

## 2019-08-23 NOTE — Progress Notes (Addendum)
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 did come across the fact that he is been followed by hematology for a microcytic hypochromic anemia. He had a reasonably normal looking hemoglobin electrophoresis. His iron level was 10 and according to the patient he is going for IV iron infusions starting tomorrow. He had a sedimentation rate of 74. More problematically from a pure wound care point of view his albumin was 2.7 earlier this month 05/17/17; this is a patient I follow monthly. He has a large now stage IV wound over his bilateral  buttocks with close proximity to his anal opening. More recently he has developed a large area with exposed bone in the center of this probably secondary to the underlying osteomyelitis E had in the summer. He also follows with Dr. Myriam Jacobson at [facility] who is plastic surgery. He had an appointment earlier this month and according to the patient Dr. Myriam Jacobson does not want to proceed with any attempted flap closure. Although I do not have current access to her note in care everywhere this is likely due to exposed bone. Again according to the patient they did a bone biopsy. He is still using a variant of Dakin solution changing twice a day. He has home health. The patient is not able to give me a firm answer about how long he spends on this in his wheelchair The patient also states that Dr. Myriam Jacobson wanted to reconsider a wound VAC. I really don't see this as a viable option at least not in the outpatient setting. The wound itself is frankly to close to his anal opening to maintain a seal. The last time we tried to do this home health was unable to  manage it. It might be possible to maintain a wound VAC in this setting outside of the home such as a skilled nursing facility or an [facility] however I am doubtful about this even in that setting **** READMISSION 09/21/17-He is here for evaluation of stage IV sacral ulcer. Since his last evaluation here in December he has completed treatment for sacral osteomyelitis. He was at [facility] for IV therapy and NPWT dressing changes. He was discharged, with home health services, in February. He admits that while in the skilled facility he had "80%" success with maintaining dressing, since discharge he has had approximately "40%" success with maintaining wound VAC dressing. We discussed at length that this is not a safe or recommended option. We will apply Dakin's wet to dry dressing daily and he will follow-up next week. He is accompanied today by his sister who is  willing to assist in dressing changes; they will discuss the social issue as he feels he is capable of changing dressing daily when home health is not able. 09/28/17-He is here in follow-up evaluation for stage IV sacral pressure ulcer. He has been using the Dakin's wet-to-dry daily; he continues with home health. He is not accompanied by anyone at this visit. He will follow up in two weeks per his request/preference. 10/12/17 on evaluation today patient appears to be doing very well. The Dakin solution went to dry packings do seem to be helping him as far as the sacral wound is concerned I'm not seeing anything that has me more concerned as far as infection or otherwise is concerned. Overall I'm pleased with the appearance of the wound. 10/26/17-He is here in follow up evaluation for a stage IV sacral ulcer. He continues with daily Dakin's wet-to-dry. He is voicing no complaints or concerns. He will follow-up in 2 weeks 11/16/17-He is here for follow up evaluation for a palliative stage IV sacral pressure ulcer. We will continue with Dakin's wet-to-dry. He will follow-up in 4 weeks. He is expressing concern/complaint regarding new bed that has arrived, stating he is unable to manipulate/maneuver it due to the bed crank being at the foot of the bed. 12/14/17-He is here for evaluation for palliative stage IV sacral ulcer. He is voicing no complaints or concerns. We will continue with one-to-one ratio of saline and dakins. He will follow-up in 3 weeks 01/04/18-He is here in follow-up evaluation for palliative stage IV sacral ulcer. He is inquiring about reinstating the negative pressure wound therapy. We discussed at length that the negative pressure wound therapy in the home setting has not been successful for him repeatedly with loss of cereal and unavailability of 24/7 help; reminded him that home health is not available 24/7 when loss of seal occurs. He does verbalize understanding to this and does not  pursue. We also discussed the palliative nature of this ulcer (given no significant change/improvement in measurement/appearance, not a candidate for muscle flap per plastic surgery, and continued independent living) and that the goal is for maintenance, decrease in infection and minimizing/avoiding deterioration given that he is independent in his care, does not have home health and requires daily dressing changes secondary to drainage amount. He is inquiring about a wound clinic in [facility], I have informed him that I am unfamiliar with that clinic but that he is encouraged to seek another opinion if that is his desire. We will continue with dakins and he will follow up in three weeks 01/25/18-He is here in follow-up evaluation for palliative stage IV sacral  ulcer. He continues with Dakin's/saline 1:1 mixture wet to dry dressing changes. He states he has an appointment at [facility] on 9/17 for evaluation of surgical intervention/closure of the sacral ulcer. He will follow-up here in 4 weeks Readmission: 07/16/2019 upon evaluation today patient appears to be doing really about the same as when he was previously seen here in the wound care center. He most recently was a patient of [place] back when she was still working here in the center but had been referred to [facility] for consideration of a flap. With that being said the surgeon there at [facility] stated that this was too large for her flap and they have been attempting to get this smaller in order to be able to proceed with a flap. Nonetheless unfortunately he has had a cycle of going back and forth between the osteomyelitis flaring and then sent him back and then making a little bit of progress only to be sent back again. It sounds like most recently they have been using a Iodoflex type dressing at this point which does not seem to have done any harm by any means. With that being said this wound seems to be quite large for using Iodoflex throughout and subsequently I  think he may do much better with the use of Vioxx moistened gauze which would be safe for the new tissue growing and also keep the wound quite nice and clean. The patient is not opposed to this he in fact states that his home health nurse had mentioned this as a [place]. ([id number]) possibility as well. 07/23/2019 upon evaluation today patient actually appears to be doing very well in regard to his sacral wound. In fact this is much healthier the measurements not terribly different but again with a wound like this at home necessarily expect a a lot of change as far as the overall measurements are concerned in just 1 week's time. This is going be a much longer term process at this point. With that being said I do think that he is very healthy appearing as far as the base of the wound is concerned. 08/06/2019 upon evaluation today patient actually appears to be doing about the same with regard to his wound to be honest. There is really not a significant improvement overall based on what I am seeing today. Fortunately there is no signs of significant systemic infection. No fevers, chills, nausea, vomiting, or diarrhea. With that being said there is odor to the drainage from the wound and subsequently also what appears to be increased drainage based on what we are seeing today as well as what his home health nurse called [place] about that she was concerned with as well. 08/13/2019 upon evaluation today patient appears to be doing about the same at this point with regard to his wound although I think the dressing may be a little less drainage wise compared to what it was previous. Fortunately there is no signs of active infection at this time. No fevers, chills, nausea, vomiting, or diarrhea. He has been taking the antibiotic for only 3 days. 08/23/2019 upon evaluation today patient appears to be doing not nearly as well with regard to his wound. In general really not making a lot of progress which is  unfortunate. There does not appear to be any signs of active infection at this time. No fevers, chills, nausea, vomiting, or diarrhea. With that being said the patient unfortunately does seem to be experiencing continued epiboly around the edges of the  wound as well as significant scar tissue he has been in the majority of his day sitting in his chair which is likely a big reason for all of this. Fortunately there is no signs of active infection at this time. No fevers, chills, nausea, vomiting, or diarrhea. Objective Constitutional Well-nourished and well-hydrated in no acute distress. Vitals Time Taken: 11:00 AM, Temperature: 98.7 F, Pulse: 85 bpm, Respiratory Rate: 18 breaths/min, Blood Pressure: 121/63 mmHg. Respiratory normal breathing without difficulty. Psychiatric this patient is able to make decisions and demonstrates good insight into disease process. Alert and Oriented x 3. pleasant and cooperative. General Notes: Patient's wound bed currently again is going require potentially some sharp debridement but I really do not see the point in going forward with significant debridement of the epiboly which is good to likely cause significant bleeding as well in the interim. At least not until were able to get things under better control from the standpoint of how long he is sitting up on his wound. We did show him what the wound looks like today and the patient was somewhat surprised about how large and how severe this is. I explained that he really needs to make staying off of this a priority. He is can actually talk to his family specifically his sister to see what he can work out. Integumentary (Hair, Skin) Wound #10 status is Open. Original cause of wound was Pressure Injury. The wound is located on the Midline Sacrum. The wound measures 11cm length x 13cm width x 3.5cm depth; 112.312cm^2 area and 393.092cm^3 volume. There is Fat Layer (Subcutaneous Tissue) Exposed exposed. There is no  tunneling or undermining noted. There is a large amount of serosanguineous drainage noted. The wound margin is epibole. There is large (67- 100%) red granulation within the wound bed. There is a small (1-33%) amount of necrotic tissue within the wound bed including Adherent Slough. Assessment Active Problems ICD-10 Pressure ulcer of sacral region, stage 4 Other chronic osteomyelitis, other site Paraplegia, unspecified Bradley [name], Bradley A. ([id number]) Plan Wound Cleansing: Wound #10 Midline Sacrum: Clean wound with Normal Saline. Dial antibacterial soap, wash wounds, rinse and pat dry prior to dressing wounds Primary Wound Dressing: Wound #10 Midline Sacrum: Other: - Vashe moistened gauze (Dakins in clinic) Secondary Dressing: Wound #10 Midline Sacrum: ABD pad - secure with tape XtraSorb Dressing Change Frequency: Wound #10 Midline Sacrum: Change dressing every day. - Patient's son to change when [facility] does not visit Follow-up Appointments: Return Appointment in 1 week. Off-Loading: Wound #10 Midline Sacrum: Gel wheelchair cushion Turn and reposition every 2 hours Home Health: Wound #10 Midline Sacrum: [place] Nurse may visit PRN to address patient s wound care needs. FACE TO FACE ENCOUNTER: MEDICARE and MEDICAID PATIENTS: I certify that this patient is under my care and that I had a face-to-face encounter that meets the physician face-to-face encounter requirements with this patient on this date. The encounter with the patient was in whole or in part for the following MEDICAL CONDITION: (primary reason for [place]) MEDICAL NECESSITY: I certify, that based on my findings, NURSING services are a medically necessary home health service. HOME BOUND STATUS: I certify that my clinical findings support that this patient is homebound (i.e., Due to illness or injury, pt requires aid of supportive devices such as crutches, cane, wheelchairs, walkers, the  use of special transportation or the assistance of another person to leave their place of residence. There is a normal inability to leave the  home and doing so requires considerable and taxing effort. Other absences are for medical reasons / religious services and are infrequent or of short duration when for other reasons). If current dressing causes regression in wound condition, may D/C ordered dressing product/s and apply Normal Saline Moist Dressing daily until next [place] / Other MD appointment. [place] of regression in wound condition at [phone]. Please direct any NON-WOUND related issues/requests for orders to patient's Primary Care Physician The following medication(s) was prescribed: Bactrim DS oral 800 mg-160 mg tablet 1 1 tablet oral taken 2 times a day for 14 days starting 08/23/2019 1. I think optimally and ideally the patient needs to work on trying to get things under control as far as not having to stay up in his chair attempting to take care of himself all day this is detrimental to his wound and is seen in the findings that were seeing at this point. 2. I would recommend that ideally he will either needs to consider a skilled nursing facility or potentially he has family that are willing to help him as far as daily care is concerned. If he is able to get them to help him out so that he is not required to spend his days fixing his meals and just generally taking care of himself which means up in his chair for the majority of the day and I think this can be beneficial. With that being said I think he needs to work this out. 3. If he can get things worked out from the standpoint of offloading and pressure control then I will be more than happy to proceed with debridement to try to clear away some of the epiboly around the edges this is going to call significant bleeding and therefore I do not want to undertake this if it skin to be for no reason he  definitely needs to be offloading appropriately in order to help things out in this regard. Otherwise I think that this is not really the ideal thing for him. 4. I am also can recommend at this time that we go ahead and initiate another prescription for the Bactrim in order to help with bacterial control. I will put him on 1 more 2 weeks regimen. We will see patient back for reevaluation in 1 week here in the clinic. If anything worsens or changes patient will contact our office for additional recommendations. Electronic Signature(s) Signed: 08/23/2019 4:29:29 PM By: Worthy Keeler PA-C Entered By: Worthy Keeler on 08/23/2019 16:29:29 Bradley [name] ([id number]) -------------------------------------------------------------------------------- SuperBill Details Patient Name: Bradley Loa A. Date of Service: 08/23/2019 Medical Record Number: [id number] Patient Account Number: [id number] Date of Birth/Sex: 1945/06/21 (74 y.o. M) Treating RN: Montey Hora Primary Care Provider: [name] Other Clinician: Referring Provider: [name] Treating Provider/Extender: [name], Mykel Mohl Weeks in Treatment: 5 Diagnosis Coding ICD-10 Codes Code Description L89.154 Pressure ulcer of sacral region, stage 4 M86.68 Other chronic osteomyelitis, other site G82.20 Paraplegia, unspecified Facility Procedures CPT4 Code: [id number] Description: 99213 - WOUND CARE VISIT-LEV 3 EST PT Modifier: Quantity: 1 Physician Procedures CPT4 Code: [id number] Description: [id number] - WC PHYS LEVEL 4 - EST PT Modifier: Quantity: 1 CPT4 Code: Description: ICD-10 Diagnosis Description L89.154 Pressure ulcer of sacral region, stage 4 G82.20 Paraplegia, unspecified M86.68 Other chronic osteomyelitis, other site Modifier: Quantity: Electronic Signature(s) Signed: 08/23/2019 4:29:41 PM By: Worthy Keeler PA-C Entered By: Worthy Keeler on 08/23/2019 16:29:41

## 2019-08-26 DIAGNOSIS — Z933 Colostomy status: Secondary | ICD-10-CM | POA: Diagnosis not present

## 2019-08-26 DIAGNOSIS — D509 Iron deficiency anemia, unspecified: Secondary | ICD-10-CM | POA: Diagnosis not present

## 2019-08-26 DIAGNOSIS — S24104S Unspecified injury at T11-T12 level of thoracic spinal cord, sequela: Secondary | ICD-10-CM | POA: Diagnosis not present

## 2019-08-26 DIAGNOSIS — N319 Neuromuscular dysfunction of bladder, unspecified: Secondary | ICD-10-CM | POA: Diagnosis not present

## 2019-08-26 DIAGNOSIS — L89154 Pressure ulcer of sacral region, stage 4: Secondary | ICD-10-CM | POA: Diagnosis not present

## 2019-08-26 DIAGNOSIS — N400 Enlarged prostate without lower urinary tract symptoms: Secondary | ICD-10-CM | POA: Diagnosis not present

## 2019-08-26 DIAGNOSIS — G8220 Paraplegia, unspecified: Secondary | ICD-10-CM | POA: Diagnosis not present

## 2019-08-26 DIAGNOSIS — I10 Essential (primary) hypertension: Secondary | ICD-10-CM | POA: Diagnosis not present

## 2019-08-26 DIAGNOSIS — J309 Allergic rhinitis, unspecified: Secondary | ICD-10-CM | POA: Diagnosis not present

## 2019-08-28 DIAGNOSIS — N400 Enlarged prostate without lower urinary tract symptoms: Secondary | ICD-10-CM | POA: Diagnosis not present

## 2019-08-28 DIAGNOSIS — I10 Essential (primary) hypertension: Secondary | ICD-10-CM | POA: Diagnosis not present

## 2019-08-28 DIAGNOSIS — Z933 Colostomy status: Secondary | ICD-10-CM | POA: Diagnosis not present

## 2019-08-28 DIAGNOSIS — D509 Iron deficiency anemia, unspecified: Secondary | ICD-10-CM | POA: Diagnosis not present

## 2019-08-28 DIAGNOSIS — L89154 Pressure ulcer of sacral region, stage 4: Secondary | ICD-10-CM | POA: Diagnosis not present

## 2019-08-28 DIAGNOSIS — S24104S Unspecified injury at T11-T12 level of thoracic spinal cord, sequela: Secondary | ICD-10-CM | POA: Diagnosis not present

## 2019-08-28 DIAGNOSIS — J309 Allergic rhinitis, unspecified: Secondary | ICD-10-CM | POA: Diagnosis not present

## 2019-08-28 DIAGNOSIS — N319 Neuromuscular dysfunction of bladder, unspecified: Secondary | ICD-10-CM | POA: Diagnosis not present

## 2019-08-28 DIAGNOSIS — G8220 Paraplegia, unspecified: Secondary | ICD-10-CM | POA: Diagnosis not present

## 2019-08-29 ENCOUNTER — Other Ambulatory Visit: Payer: Self-pay

## 2019-08-29 ENCOUNTER — Encounter: Payer: Medicare HMO | Admitting: Physician Assistant

## 2019-08-29 DIAGNOSIS — L89154 Pressure ulcer of sacral region, stage 4: Secondary | ICD-10-CM | POA: Diagnosis not present

## 2019-08-29 DIAGNOSIS — L89153 Pressure ulcer of sacral region, stage 3: Secondary | ICD-10-CM | POA: Diagnosis not present

## 2019-08-29 DIAGNOSIS — M8668 Other chronic osteomyelitis, other site: Secondary | ICD-10-CM | POA: Diagnosis not present

## 2019-08-29 DIAGNOSIS — D509 Iron deficiency anemia, unspecified: Secondary | ICD-10-CM | POA: Diagnosis not present

## 2019-08-29 DIAGNOSIS — M069 Rheumatoid arthritis, unspecified: Secondary | ICD-10-CM | POA: Diagnosis not present

## 2019-08-29 DIAGNOSIS — G8220 Paraplegia, unspecified: Secondary | ICD-10-CM | POA: Diagnosis not present

## 2019-08-29 DIAGNOSIS — Z933 Colostomy status: Secondary | ICD-10-CM | POA: Diagnosis not present

## 2019-08-29 NOTE — Progress Notes (Addendum)
Bradley [name] ([id number]) Visit Report for 08/29/2019 Arrival Information Details Patient Name: Bradley [name], Bradley [name]. Date of Service: 08/29/2019 3:45 PM Medical Record Number: [id number] Patient Account Number: [id number] Date of Birth/Sex: [birth year] (74 y.o. M) Treating RN: Army Melia Primary Care Parris Cudworth: [name] Other Clinician: Referring Coda Filler: [name] Treating Seward Coran/Extender: [name], HOYT Weeks in Treatment: 6 Visit Information History Since Last Visit Added or deleted any medications: No Patient Arrived: Wheel Chair Any new allergies or adverse reactions: No Arrival Time: 15:50 Had a fall or experienced change in No Accompanied By: self activities of daily living that may affect Transfer Assistance: Harrel Lemon Lift risk of falls: Patient Identification Verified: Yes Signs or symptoms of abuse/neglect since last visito No Secondary Verification Process Completed: Yes Hospitalized since last visit: No Implantable device outside of the clinic excluding No cellular tissue based products placed in the center since last visit: Has Dressing in Place as Prescribed: Yes Pain Present Now: No Electronic Signature(s) Signed: 08/29/2019 4:15:53 PM By: Lorine Bears RCP, RRT, CHT Entered By: Lorine Bears on 08/29/2019 15:54:04 [name] -------------------------------------------------------------------------------- Clinic Level of Care Assessment Details Patient Name: Bradley Loa A. Date of Service: 08/29/2019 3:45 PM Medical Record Number: [id number] Patient Account Number: [id number] Date of Birth/Sex: Apr 09, 1946 (74 y.o. M) Treating RN: Army Melia Primary Care Kahla Risdon: [name] Other Clinician: Referring Finnis Colee: [name] Treating Cierah Crader/Extender: [name], HOYT Weeks in Treatment: 6 Clinic Level of Care Assessment Items TOOL 4 Quantity Score []  - Use when only an EandM is  performed on FOLLOW-UP visit 0 ASSESSMENTS - Nursing Assessment / Reassessment X - Reassessment of Co-morbidities (includes updates in patient status) 1 10 X- 1 5 Reassessment of Adherence to Treatment Plan ASSESSMENTS - Wound and Skin Assessment / Reassessment X - Simple Wound Assessment / Reassessment - one wound 1 5 []  - 0 Complex Wound Assessment / Reassessment - multiple wounds []  - 0 Dermatologic / Skin Assessment (not related to wound area) ASSESSMENTS - Focused Assessment []  - Circumferential Edema Measurements - multi extremities 0 []  - 0 Nutritional Assessment / Counseling / Intervention []  - 0 Lower Extremity Assessment (monofilament, tuning fork, pulses) []  - 0 Peripheral Arterial Disease Assessment (using hand held doppler) ASSESSMENTS - Ostomy and/or Continence Assessment and Care []  - Incontinence Assessment and Management 0 []  - 0 Ostomy Care Assessment and Management (repouching, etc.) PROCESS - Coordination of Care X - Simple Patient / Family Education for ongoing care 1 15 []  - 0 Complex (extensive) Patient / Family Education for ongoing care []  - 0 Staff obtains Programmer, systems, Records, Test Results / Process Orders []  - 0 Staff telephones HHA, Nursing Homes / Clarify orders / etc []  - 0 Routine Transfer to another Facility (non-emergent condition) []  - 0 Routine Hospital Admission (non-emergent condition) []  - 0 New Admissions / Biomedical engineer / Ordering NPWT, Apligraf, etc. []  - 0 Emergency Hospital Admission (emergent condition) X- 1 10 Simple Discharge Coordination []  - 0 Complex (extensive) Discharge Coordination PROCESS - Special Needs []  - Pediatric / Minor Patient Management 0 []  - 0 Isolation Patient Management []  - 0 Hearing / Language / Visual special needs []  - 0 Assessment of Community assistance (transportation, D/C planning, etc.) ALMANDO, VITTITOW A. ([id number]) []  - 0 Additional assistance / Altered mentation []  - 0 Support  Surface(s) Assessment (bed, cushion, seat, etc.) INTERVENTIONS - Wound Cleansing / Measurement X - Simple Wound Cleansing - one wound 1 5 []  - 0 Complex Wound Cleansing -  multiple wounds X- 1 5 Wound Imaging (photographs - any number of wounds) []  - 0 Wound Tracing (instead of photographs) X- 1 5 Simple Wound Measurement - one wound []  - 0 Complex Wound Measurement - multiple wounds INTERVENTIONS - Wound Dressings []  - Small Wound Dressing one or multiple wounds 0 X- 1 15 Medium Wound Dressing one or multiple wounds []  - 0 Large Wound Dressing one or multiple wounds []  - 0 Application of Medications - topical []  - 0 Application of Medications - injection INTERVENTIONS - Miscellaneous []  - External ear exam 0 []  - 0 Specimen Collection (cultures, biopsies, blood, body fluids, etc.) []  - 0 Specimen(s) / Culture(s) sent or taken to Lab for analysis []  - 0 Patient Transfer (multiple staff / Civil Service fast streamer / Similar devices) []  - 0 Simple Staple / Suture removal (25 or less) []  - 0 Complex Staple / Suture removal (26 or more) []  - 0 Hypo / Hyperglycemic Management (close monitor of Blood Glucose) []  - 0 Ankle / Brachial Index (ABI) - do not check if billed separately X- 1 5 Vital Signs Has the patient been seen at the hospital within the last three years: Yes Total Score: 80 Level Of Care: New/Established - Level 3 Electronic Signature(s) Signed: 08/29/2019 4:44:08 PM By: Army Melia Entered By: Army Melia on 08/29/2019 16:36:18 [name] -------------------------------------------------------------------------------- Encounter Discharge Information Details Patient Name: Bradley Loa A. Date of Service: 08/29/2019 3:45 PM Medical Record Number: [id number] Patient Account Number: [id number] Date of Birth/Sex: 1945/12/10 (74 y.o. M) Treating RN: Army Melia Primary Care Tressa [name]: [name] Other Clinician: Referring Audrina Marten: [name] Treating Caryn Gienger/Extender: [name], HOYT Weeks in Treatment: 6 Encounter Discharge Information Items Discharge Condition: Stable Ambulatory Status: Wheelchair Discharge Destination: Home Transportation: Private Auto Accompanied By: self Schedule Follow-up Appointment: Yes Clinical Summary of Care: Electronic Signature(s) Signed: 08/29/2019 4:44:08 PM By: Army Melia Entered By: Army Melia on 08/29/2019 16:37:34 [name] -------------------------------------------------------------------------------- Lower Extremity Assessment Details Patient Name: Bradley Loa A. Date of Service: 08/29/2019 3:45 PM Medical Record Number: [id number] Patient Account Number: [id number] Date of Birth/Sex: 12-08-1945 (74 y.o. M) Treating RN: Montey Hora Primary Care Andon Villard: [name] Other Clinician: Referring Kendan Cornforth: [name] Treating Lainey Nelson/Extender: [name], HOYT Weeks in Treatment: 6 Electronic Signature(s) Signed: 08/29/2019 4:59:02 PM By: Montey Hora Entered By: Montey Hora on 08/29/2019 16:06:25 [name] -------------------------------------------------------------------------------- Multi Wound Chart Details Patient Name: Bradley Loa A. Date of Service: 08/29/2019 3:45 PM Medical Record Number: [id number] Patient Account Number: [id number] Date of Birth/Sex: [birth year] (74 y.o. M) Treating RN: Army Melia Primary Care Bobby Ragan: [name] Other Clinician: Referring Lucette Kratz: [name] Treating Birdena Kingma/Extender: [name], HOYT Weeks in Treatment: 6 Vital Signs Height(in): Pulse(bpm): 14 Weight(lbs): Blood Pressure(mmHg): 116/83 Body Mass Index(BMI): Temperature(F): 98.4 Respiratory Rate(breaths/min): 18 Photos: [N/A:N/A] Wound Location: Sacrum - Midline N/A N/A Wounding Event: Pressure Injury N/A N/A Primary Etiology: Pressure Ulcer N/A N/A Comorbid History: Anemia,  Hypertension, History of N/A N/A pressure wounds, Rheumatoid Arthritis, Paraplegia Date Acquired: 06/07/2015 N/A N/A Weeks of Treatment: 6 N/A N/A Wound Status: Open N/A N/A Measurements L x W x D (cm) 11.5x9x4.5 N/A N/A Area (cm) : 81.289 N/A N/A Volume (cm) : 365.799 N/A N/A % Reduction in Area: -57.80% N/A N/A % Reduction in Volume: -18.30% N/A N/A Classification: Category/Stage IV N/A N/A Exudate Amount: Medium N/A N/A Exudate Type: Serosanguineous N/A N/A Exudate Color: red, brown N/A N/A Wound Margin: Epibole N/A N/A Granulation Amount: Large (67-100%)  N/A N/A Granulation Quality: Red N/A N/A Necrotic Amount: Small (1-33%) N/A N/A Exposed Structures: Fat Layer (Subcutaneous Tissue) N/A N/A Exposed: Yes Fascia: No Tendon: No Muscle: No Joint: No Bone: No Epithelialization: None N/A N/A Treatment Notes Electronic Signature(s) Signed: 08/29/2019 4:44:08 PM By: Army Melia Entered By: Army Melia on 08/29/2019 16:33:31 KEIGHAN, SCHOENBECK ([id number]) DYLANGER, FUENTEZ ([id number]) -------------------------------------------------------------------------------- Multi-Disciplinary Care Plan Details Patient Name: ADEOLA, GEGG A. Date of Service: 08/29/2019 3:45 PM Medical Record Number: [id number] Patient Account Number: [id number] Date of Birth/Sex: 03-Oct-1945 (74 y.o. M) Treating RN: Army Melia Primary Care Maurisha Mongeau: [name] Other Clinician: Referring Jaimya Feliciano: [name] Treating Saxon Crosby/Extender: [name], HOYT Weeks in Treatment: 6 Active Inactive Abuse / Safety / Falls / Self Care Management Nursing Diagnoses: Impaired physical mobility Goals: Patient will not develop complications from immobility Date Initiated: 07/16/2019 Target Resolution Date: 10/12/2019 Goal Status: Active Interventions: Assess Activities of Daily Living upon admission and as needed Notes: Orientation to the Wound Care Program Nursing Diagnoses: Knowledge deficit  related to the wound healing center program Goals: Patient/caregiver will verbalize understanding of the [facility] Date Initiated: 07/16/2019 Target Resolution Date: 10/12/2019 Goal Status: Active Interventions: Provide education on orientation to the wound center Notes: Pressure Nursing Diagnoses: Knowledge deficit related to causes and risk factors for pressure ulcer development Goals: Patient will remain free from development of additional pressure ulcers Date Initiated: 07/16/2019 Target Resolution Date: 10/12/2019 Goal Status: Active Interventions: Provide education on pressure ulcers Notes: Wound/Skin Impairment Nursing Diagnoses: Impaired tissue integrity WELBY, GEROLD ([id number]) Goals: Ulcer/skin breakdown will heal within 14 weeks Date Initiated: 07/16/2019 Target Resolution Date: 10/12/2019 Goal Status: Active Interventions: Assess patient/caregiver ability to obtain necessary supplies Assess patient/caregiver ability to perform ulcer/skin care regimen upon admission and as needed Assess ulceration(s) every visit Notes: Electronic Signature(s) Signed: 08/29/2019 4:44:08 PM By: Army Melia Entered By: Army Melia on 08/29/2019 16:33:23 [name] -------------------------------------------------------------------------------- Pain Assessment Details Patient Name: Bradley Loa A. Date of Service: 08/29/2019 3:45 PM Medical Record Number: [id number] Patient Account Number: [id number] Date of Birth/Sex: 08/11/1945 (74 y.o. M) Treating RN: Montey Hora Primary Care Manami Tutor: [name] Other Clinician: Referring Astria Jordahl: [name] Treating Emunah Texidor/Extender: [name], HOYT Weeks in Treatment: 6 Active Problems Location of Pain Severity and Description of Pain Patient Has Paino No Site Locations Pain Management and Medication Current Pain Management: Electronic Signature(s) Signed: 08/29/2019 4:59:02 PM By:  Montey Hora Entered By: Montey Hora on 08/29/2019 16:06:17 [name] -------------------------------------------------------------------------------- Patient/Caregiver Education Details Patient Name: Bradley Loa A. Date of Service: 08/29/2019 3:45 PM Medical Record Number: [id number] Patient Account Number: [id number] Date of Birth/Gender: [birth year] (74 y.o. M) Treating RN: Army Melia Primary Care Physician: [name] Other Clinician: Referring Physician: [name] Treating Physician/Extender: Sharalyn Ink in Treatment: 6 Education Assessment Education Provided To: Patient Education Topics Provided Wound/Skin Impairment: Handouts: Caring for Your Ulcer Methods: Demonstration, Explain/Verbal Responses: State content correctly Electronic Signature(s) Signed: 08/29/2019 4:44:08 PM By: Army Melia Entered By: Army Melia on 08/29/2019 16:36:29 [name] -------------------------------------------------------------------------------- Wound Assessment Details Patient Name: Bradley Loa A. Date of Service: 08/29/2019 3:45 PM Medical Record Number: [id number] Patient Account Number: [id number] Date of Birth/Sex: 1946-03-27 (74 y.o. M) Treating RN: Montey Hora Primary Care Marea Reasner: [name] Other Clinician: Referring Eldra Word: [name] Treating Rikki Smestad/Extender: [name], HOYT Weeks in Treatment: 6 Wound Status Wound Number: 10 Primary Pressure Ulcer Etiology: Wound Location: Sacrum - Midline Wound Status: Open Wounding Event: Pressure  Injury Comorbid Anemia, Hypertension, History of pressure wounds, Date Acquired: 06/07/2015 History: Rheumatoid Arthritis, Paraplegia Weeks Of Treatment: 6 Clustered Wound: No Photos Wound Measurements Length: (cm) 11.5 % Reduc Width: (cm) 9 % Reduc Depth: (cm) 4.5 Epithel Area: (cm) 81.289 Tunnel Volume: (cm) 365.799 Underm tion in Area:  -57.8% tion in Volume: -18.3% ialization: None ing: No ining: No Wound Description Classification: Category/Stage IV Foul O Wound Margin: Epibole Slough Exudate Amount: Medium Exudate Type: Serosanguineous Exudate Color: red, brown dor After Cleansing: No /Fibrino Yes Wound Bed Granulation Amount: Large (67-100%) Exposed Structure Granulation Quality: Red Fascia Exposed: No Necrotic Amount: Small (1-33%) Fat Layer (Subcutaneous Tissue) Exposed: Yes Necrotic Quality: Adherent Slough Tendon Exposed: No Muscle Exposed: No Joint Exposed: No Bone Exposed: No Treatment Notes Wound #10 (Midline Sacrum) Notes dakins moistened gauze, xtrasorb, abd and tape Electronic Signature(s) Signed: 08/29/2019 4:59:02 PM By: Gaynelle Adu ([id number]) Entered By: Montey Hora on 08/29/2019 16:10:46 [name] -------------------------------------------------------------------------------- Vitals Details Patient Name: Bradley Loa A. Date of Service: 08/29/2019 3:45 PM Medical Record Number: [id number] Patient Account Number: [id number] Date of Birth/Sex: 06/08/1945 (74 y.o. M) Treating RN: Army Melia Primary Care Gracia Saggese: [name] Other Clinician: Referring Breda Bond: [name] Treating Tavarus Poteete/Extender: [name], HOYT Weeks in Treatment: 6 Vital Signs Time Taken: 15:55 Temperature (F): 98.4 Pulse (bpm): 86 Respiratory Rate (breaths/min): 18 Blood Pressure (mmHg): 116/83 Reference Range: 80 - 120 mg / dl Electronic Signature(s) Signed: 08/29/2019 4:15:53 PM By: Lorine Bears RCP, RRT, CHT Entered By: Lorine Bears on 08/29/2019 15:57:37

## 2019-08-29 NOTE — Progress Notes (Addendum)
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 using a Dakin's wet to dry. Most of the wound surface looks satisfactory however the deeper area over his coccyx now has exposed bone I'm not sure if I noted this last week. 03/21/17; patient is usingVASHE solution wet to dry which I gather is a variation on Dakin solution. He has home health changing this 3 times a week the other days he does this himself. His appointment with plastic surgery 04/18/17;  patient continues to use a variant of Dakin solution I believe. His wound continues to have a clean viable surface. The 2 areas of exposed bone in the center of this wound had closed over. He has an appointment with plastic surgery on December 5 at which time I hope that there'll be a plan for myocutaneous flap closure In looking through [place] link I couldn't find any more plastic surgery appointments. I did come across the fact that he is been followed by hematology for a microcytic hypochromic anemia. He had a reasonably normal looking hemoglobin electrophoresis. His iron level was 10 and according to the patient he is going for IV iron infusions starting tomorrow. He had a sedimentation rate of 74. More problematically from a pure wound care point of view his albumin was 2.7 earlier this month 05/17/17; this is a patient I follow monthly. He has a large now stage IV wound over his bilateral buttocks with close proximity to his anal opening. More recently he has developed a  large area with exposed bone in the center of this probably secondary to the underlying osteomyelitis E had in the summer. He also follows with Dr. Myriam Jacobson at [facility] who is plastic surgery. He had an appointment earlier this month and according to the patient Dr. Myriam Jacobson does not want to proceed with any attempted flap closure. Although I do not have current access to her note in care everywhere this is likely due to exposed bone. Again according to the patient they did a bone biopsy. He is still using a variant of Dakin solution changing twice a day. He has home health. The patient is not able to give me a firm answer about how long he spends on this in his wheelchair The patient also states that Dr. Myriam Jacobson wanted to reconsider a wound VAC. I really don't see this as a viable option at least not in the outpatient setting. The wound itself is frankly to close to his anal opening to maintain a seal. The last time we tried to do  this home health was unable to manage it. It might be possible to maintain a wound VAC in this setting outside of the home such as a skilled nursing facility or an [facility] however I am doubtful about this even in that setting **** READMISSION 09/21/17-He is here for evaluation of stage IV sacral ulcer. Since his last evaluation here in December he has completed treatment for sacral osteomyelitis. He was at [facility] for IV therapy and NPWT dressing changes. He was discharged, with home health services, in February. He admits that while in the skilled facility he had "80%" success with maintaining dressing, since discharge he has had approximately "40%" success with maintaining wound VAC dressing. We discussed at length that this is not a safe or recommended option. We will apply Dakin's wet to dry dressing daily and he will follow-up next week. He is accompanied today by his sister who is willing to assist in dressing changes; they will discuss the social issue as he feels he is capable of changing dressing daily when home health is not able. 09/28/17-He is here in follow-up evaluation for stage IV sacral pressure ulcer. He has been using the Dakin's wet-to-dry daily; he continues with home health. He is not accompanied by anyone at this visit. He will follow up in two weeks per his request/preference. 10/12/17 on evaluation today patient appears to be doing very well. The Dakin solution went to dry packings do seem to be helping him as far as the sacral wound is concerned I'm not seeing anything that has me more concerned as far as infection or otherwise is concerned. Overall I'm pleased with the appearance of the wound. 10/26/17-He is here in follow up evaluation for a stage IV sacral ulcer. He continues with daily Dakin's wet-to-dry. He is voicing no complaints or concerns. He will follow-up in 2 weeks 11/16/17-He is here for follow up evaluation for a palliative stage IV sacral pressure ulcer.  We will continue with Dakin's wet-to-dry. He will follow-up in 4 weeks. He is expressing concern/complaint regarding new bed that has arrived, stating he is unable to manipulate/maneuver it due to the bed crank being at the foot of the bed. 12/14/17-He is here for evaluation for palliative stage IV sacral ulcer. He is voicing no complaints or concerns. We will continue with one-to-one ratio of saline and dakins. He will follow-up in 3 weeks 01/04/18-He is here in follow-up evaluation for palliative stage IV sacral ulcer. He is inquiring  about reinstating the negative pressure wound therapy. We discussed at length that the negative pressure wound therapy in the home setting has not been successful for him repeatedly with loss of cereal and unavailability of 24/7 help; reminded him that home health is not available 24/7 when loss of seal occurs. He does verbalize understanding to this and does not pursue. We also discussed the palliative nature of this ulcer (given no significant change/improvement in measurement/appearance, not a candidate for muscle flap per plastic surgery, and continued independent living) and that the goal is for maintenance, decrease in infection and minimizing/avoiding deterioration given that he is independent in his care, does not have home health and requires daily dressing changes secondary to drainage amount. He is inquiring about a wound clinic in [facility], I have informed him that I am unfamiliar with that clinic but that he is encouraged to seek another opinion if that is his desire. We will continue with dakins and he will follow up in three weeks 01/25/18-He is here in follow-up evaluation for palliative stage IV sacral ulcer. He continues with Dakin's/saline 1:1 mixture wet to dry dressing changes. He states he has an appointment at [facility] on 9/17 for evaluation of surgical intervention/closure of the sacral ulcer. He will follow-up here in 4 weeks Readmission: 07/16/2019 upon  evaluation today patient appears to be doing really about the same as when he was previously seen here in the wound care center. He most recently was a patient of [place] back when she was still working here in the center but had been referred to [facility] for consideration of a flap. With that being said the surgeon there at [facility] stated that this was too large for her flap and they have been attempting to get this smaller in order to be able to proceed with a flap. Nonetheless unfortunately he has had a cycle of going back and forth between the osteomyelitis flaring and then sent him back and then making a little bit of progress only to be sent back again. It sounds like most recently they have been using a Iodoflex type dressing at this point which does not seem to have done any harm by any means. With that being said this wound seems to be quite large for using Iodoflex throughout and subsequently I think he may do much better with the use of Vioxx moistened gauze which would be safe for the new tissue growing and also keep the wound quite nice and clean. The patient is not opposed to this he in fact states that his home health nurse had mentioned this as a [place]. ([id number]) possibility as well. 07/23/2019 upon evaluation today patient actually appears to be doing very well in regard to his sacral wound. In fact this is much healthier the measurements not terribly different but again with a wound like this at home necessarily expect a a lot of change as far as the overall measurements are concerned in just 1 week's time. This is going be a much longer term process at this point. With that being said I do think that he is very healthy appearing as far as the base of the wound is concerned. 08/06/2019 upon evaluation today patient actually appears to be doing about the same with regard to his wound to be honest. There is really not a significant improvement overall based on what I am seeing today.  Fortunately there is no signs of significant systemic infection. No fevers, chills, nausea, vomiting, or diarrhea. With  that being said there is odor to the drainage from the wound and subsequently also what appears to be increased drainage based on what we are seeing today as well as what his home health nurse called [place] about that she was concerned with as well. 08/13/2019 upon evaluation today patient appears to be doing about the same at this point with regard to his wound although I think the dressing may be a little less drainage wise compared to what it was previous. Fortunately there is no signs of active infection at this time. No fevers, chills, nausea, vomiting, or diarrhea. He has been taking the antibiotic for only 3 days. 08/23/2019 upon evaluation today patient appears to be doing not nearly as well with regard to his wound. In general really not making a lot of progress which is unfortunate. There does not appear to be any signs of active infection at this time. No fevers, chills, nausea, vomiting, or diarrhea. With that being said the patient unfortunately does seem to be experiencing continued epiboly around the edges of the wound as well as significant scar tissue he has been in the majority of his day sitting in his chair which is likely a big reason for all of this. Fortunately there is no signs of active infection at this time. No fevers, chills, nausea, vomiting, or diarrhea. 08/29/2019 upon evaluation today patient's wound actually appears to be showing some signs of improvement. The region of few areas of new skin growth around the edges of the wound even where he has some of the epiboly. This is actually good news and I think that he has been very aggressive and offloading over the past week since we showed him the pictures of his wound last week. There still may be a chance that he is going require some sharp debridement to clear away some of these edges but to be honest I think  that is can be quite an undertaking. The main reason is that he has a lot of thickened scar tissue and it is good to bleed quite significantly in my opinion. The I think if organ to do that we may even need to have a portable or disposable cautery in order to make sure that we are able to get the area completely sealed up as far as bleeding is concerned. I do have one that we can utilize. However being that the wound looks so much better right now would like to give this 2 weeks to see how things stand and look at that point before making any additional recommendations. Objective Constitutional Well-nourished and well-hydrated in no acute distress. Vitals Time Taken: 3:55 PM, Temperature: 98.4 F, Pulse: 86 bpm, Respiratory Rate: 18 breaths/min, Blood Pressure: 116/83 mmHg. Respiratory normal breathing without difficulty. Psychiatric this patient is able to make decisions and demonstrates good insight into disease process. Alert and Oriented x 3. pleasant and cooperative. General Notes: Upon inspection patient's wound actually appeared to have a better quality to the base of the wound which was good news. Overall I am pleased in that regard. With that being said he unfortunately still does have the epiboly and rolled edges with significant scar tissue at this time. Nonetheless I think to undertake clearing this away is good to be quite a process and to be honest if he is able to continue to improve and even show some signs of new epithelial growth which seems to be happening down the sidewalls of the epiboly then we may not have to  proceed with additional significant debridements going forward. The patient is in agreement with giving this a little time. We will plan to see him in 2 weeks for reevaluation. Integumentary (Hair, Skin) Wound #10 status is Open. Original cause of wound was Pressure Injury. The wound is located on the Midline Sacrum. The wound measures 11.5cm length x 9cm width x  4.5cm depth; 81.289cm^2 area and 365.799cm^3 volume. There is Fat Layer (Subcutaneous Tissue) Exposed exposed. There is no tunneling or undermining noted. There is a medium amount of serosanguineous drainage noted. The wound margin is epibole. There is large (67- 100%) red granulation within the wound bed. There is a small (1-33%) amount of necrotic tissue within the wound bed including Adherent Slough. Bradley [name], Bradley [name] ([id number]) Assessment Active Problems ICD-10 Pressure ulcer of sacral region, stage 4 Other chronic osteomyelitis, other site Paraplegia, unspecified Plan Wound Cleansing: Wound #10 Midline Sacrum: Clean wound with Normal Saline. Dial antibacterial soap, wash wounds, rinse and pat dry prior to dressing wounds Primary Wound Dressing: Wound #10 Midline Sacrum: Other: - Vashe moistened gauze (Dakins in clinic) Secondary Dressing: Wound #10 Midline Sacrum: ABD pad - secure with tape XtraSorb Dressing Change Frequency: Wound #10 Midline Sacrum: Change dressing every day. - Patient's son to change when [name] does not visit Follow-up Appointments: Return Appointment in 1 week. Off-Loading: Wound #10 Midline Sacrum: Gel wheelchair cushion Turn and reposition every 2 hours Home Health: Wound #10 Midline Sacrum: [place] Nurse may visit PRN to address patient s wound care needs. FACE TO FACE ENCOUNTER: MEDICARE and MEDICAID PATIENTS: I certify that this patient is under my care and that I had a face-to-face encounter that meets the physician face-to-face encounter requirements with this patient on this date. The encounter with the patient was in whole or in part for the following MEDICAL CONDITION: (primary reason for [place]) MEDICAL NECESSITY: I certify, that based on my findings, NURSING services are a medically necessary home health service. HOME BOUND STATUS: I certify that my clinical findings support that this patient is  homebound (i.e., Due to illness or injury, pt requires aid of supportive devices such as crutches, cane, wheelchairs, walkers, the use of special transportation or the assistance of another person to leave their place of residence. There is a normal inability to leave the home and doing so requires considerable and taxing effort. Other absences are for medical reasons / religious services and are infrequent or of short duration when for other reasons). If current dressing causes regression in wound condition, may D/C ordered dressing product/s and apply Normal Saline Moist Dressing daily until next [place] / Other MD appointment. Sasser of regression in wound condition at [phone]. Please direct any NON-WOUND related issues/requests for orders to patient's Primary Care Physician 1. At this point I would recommend that we continue with the Vashe moistened gauze dressings at this time. 2. I am also going to recommend at this point that we go ahead and continue with appropriate and aggressive offloading which we discussed with the patient last week he does have one of his family members living with him now to help him with getting up and getting down as far as staying off of the wound is concerned and aggressive offloading is concerned. 3. I am also can recommend currently that we continue with Xtrasorb over top of the wound in order to keep things dry and the edges of the wound from becoming too macerated. 4.  I still recommend a gel overlay for the cushion in his wheelchair which does seem to be helping. Again we may need to consider more aggressive sharp debridement of the rolled edges but again today the wound looks so much better I like to give it 2 weeks and see where things stand for proceeding with that just because this can be such a aggressive procedure to try to clear these away in the office. We will definitely probably have to try a small area first just  due to the amount of bleeding before we were able to even proceed with a larger region. We will see patient back for reevaluation in 1 week here in the clinic. If anything worsens or changes patient will contact our office for additional recommendations. AZIEL, SCHAD ([id number]) Electronic Signature(s) Signed: 08/29/2019 4:58:41 PM By: Worthy Keeler PA-C Entered By: Worthy Keeler on 08/29/2019 16:58:41 Bradley [name], Bradley [name] ([id number]) -------------------------------------------------------------------------------- SuperBill Details Patient Name: Bradley [name] Date of Service: 08/29/2019 Medical Record Number: [id number] Patient Account Number: [id number] Date of Birth/Sex: May 18, 1946 (74 y.o. M) Treating RN: Army Melia Primary Care Provider: [name] Other Clinician: Referring Provider: [name] Treating Provider/Extender: [name], Dreyah Montrose Weeks in Treatment: 6 Diagnosis Coding ICD-10 Codes Code Description L89.154 Pressure ulcer of sacral region, stage 4 M86.68 Other chronic osteomyelitis, other site G82.20 Paraplegia, unspecified Facility Procedures CPT4 Code: [id number] Description: 99213 - WOUND CARE VISIT-LEV 3 EST PT Modifier: Quantity: 1 Physician Procedures CPT4 Code: [id number] Description: [id number] - WC PHYS LEVEL 3 - EST PT Modifier: Quantity: 1 CPT4 Code: Description: ICD-10 Diagnosis Description L89.154 Pressure ulcer of sacral region, stage 4 M86.68 Other chronic osteomyelitis, other site G82.20 Paraplegia, unspecified Modifier: Quantity: Electronic Signature(s) Signed: 08/29/2019 4:58:55 PM By: Worthy Keeler PA-C Entered By: Worthy Keeler on 08/29/2019 16:58:54

## 2019-08-30 DIAGNOSIS — Z933 Colostomy status: Secondary | ICD-10-CM | POA: Diagnosis not present

## 2019-08-30 DIAGNOSIS — G8220 Paraplegia, unspecified: Secondary | ICD-10-CM | POA: Diagnosis not present

## 2019-08-30 DIAGNOSIS — D509 Iron deficiency anemia, unspecified: Secondary | ICD-10-CM | POA: Diagnosis not present

## 2019-08-30 DIAGNOSIS — N319 Neuromuscular dysfunction of bladder, unspecified: Secondary | ICD-10-CM | POA: Diagnosis not present

## 2019-08-30 DIAGNOSIS — J309 Allergic rhinitis, unspecified: Secondary | ICD-10-CM | POA: Diagnosis not present

## 2019-08-30 DIAGNOSIS — S24104S Unspecified injury at T11-T12 level of thoracic spinal cord, sequela: Secondary | ICD-10-CM | POA: Diagnosis not present

## 2019-08-30 DIAGNOSIS — I10 Essential (primary) hypertension: Secondary | ICD-10-CM | POA: Diagnosis not present

## 2019-08-30 DIAGNOSIS — N400 Enlarged prostate without lower urinary tract symptoms: Secondary | ICD-10-CM | POA: Diagnosis not present

## 2019-08-30 DIAGNOSIS — L89154 Pressure ulcer of sacral region, stage 4: Secondary | ICD-10-CM | POA: Diagnosis not present

## 2019-08-31 DIAGNOSIS — L89154 Pressure ulcer of sacral region, stage 4: Secondary | ICD-10-CM | POA: Diagnosis not present

## 2019-08-31 DIAGNOSIS — G8220 Paraplegia, unspecified: Secondary | ICD-10-CM | POA: Diagnosis not present

## 2019-08-31 DIAGNOSIS — M869 Osteomyelitis, unspecified: Secondary | ICD-10-CM | POA: Diagnosis not present

## 2019-09-02 ENCOUNTER — Ambulatory Visit: Payer: Medicare HMO

## 2019-09-02 ENCOUNTER — Other Ambulatory Visit: Payer: Self-pay

## 2019-09-02 ENCOUNTER — Ambulatory Visit
Admission: RE | Admit: 2019-09-02 | Discharge: 2019-09-02 | Disposition: A | Discharge status: Home / Self Care | Payer: Medicare HMO | Source: Ambulatory Visit | Attending: Urology | Admitting: Urology

## 2019-09-02 DIAGNOSIS — N319 Neuromuscular dysfunction of bladder, unspecified: Secondary | ICD-10-CM | POA: Insufficient documentation

## 2019-09-02 DIAGNOSIS — I10 Essential (primary) hypertension: Secondary | ICD-10-CM | POA: Diagnosis not present

## 2019-09-02 DIAGNOSIS — N400 Enlarged prostate without lower urinary tract symptoms: Secondary | ICD-10-CM | POA: Diagnosis not present

## 2019-09-02 DIAGNOSIS — L89154 Pressure ulcer of sacral region, stage 4: Secondary | ICD-10-CM | POA: Diagnosis not present

## 2019-09-02 DIAGNOSIS — S24104S Unspecified injury at T11-T12 level of thoracic spinal cord, sequela: Secondary | ICD-10-CM | POA: Diagnosis not present

## 2019-09-02 DIAGNOSIS — Z933 Colostomy status: Secondary | ICD-10-CM | POA: Diagnosis not present

## 2019-09-02 DIAGNOSIS — D509 Iron deficiency anemia, unspecified: Secondary | ICD-10-CM | POA: Diagnosis not present

## 2019-09-02 DIAGNOSIS — N200 Calculus of kidney: Secondary | ICD-10-CM | POA: Diagnosis not present

## 2019-09-02 DIAGNOSIS — G8220 Paraplegia, unspecified: Secondary | ICD-10-CM | POA: Diagnosis not present

## 2019-09-02 DIAGNOSIS — J309 Allergic rhinitis, unspecified: Secondary | ICD-10-CM | POA: Diagnosis not present

## 2019-09-04 ENCOUNTER — Telehealth: Payer: Self-pay | Admitting: Family Medicine

## 2019-09-04 DIAGNOSIS — D509 Iron deficiency anemia, unspecified: Secondary | ICD-10-CM | POA: Diagnosis not present

## 2019-09-04 DIAGNOSIS — G8220 Paraplegia, unspecified: Secondary | ICD-10-CM | POA: Diagnosis not present

## 2019-09-04 DIAGNOSIS — Z933 Colostomy status: Secondary | ICD-10-CM | POA: Diagnosis not present

## 2019-09-04 DIAGNOSIS — J309 Allergic rhinitis, unspecified: Secondary | ICD-10-CM | POA: Diagnosis not present

## 2019-09-04 DIAGNOSIS — L89154 Pressure ulcer of sacral region, stage 4: Secondary | ICD-10-CM | POA: Diagnosis not present

## 2019-09-04 DIAGNOSIS — N319 Neuromuscular dysfunction of bladder, unspecified: Secondary | ICD-10-CM | POA: Diagnosis not present

## 2019-09-04 DIAGNOSIS — I10 Essential (primary) hypertension: Secondary | ICD-10-CM | POA: Diagnosis not present

## 2019-09-04 DIAGNOSIS — N400 Enlarged prostate without lower urinary tract symptoms: Secondary | ICD-10-CM | POA: Diagnosis not present

## 2019-09-04 DIAGNOSIS — S24104S Unspecified injury at T11-T12 level of thoracic spinal cord, sequela: Secondary | ICD-10-CM | POA: Diagnosis not present

## 2019-09-04 NOTE — Telephone Encounter (Signed)
Patient notified and appointment has been scheduled for 1 year.

## 2019-09-04 NOTE — Telephone Encounter (Signed)
-----   Message from Chrystie Nose, [place] sent at 09/04/2019  8:20 AM EDT -----  ----- Message ----- From: Abbie Sons, MD Sent: 09/03/2019   9:11 PM EDT To: Chrystie Nose, CMA  RUS shows no significant abnormalities. Recc annual f/u

## 2019-09-06 DIAGNOSIS — N400 Enlarged prostate without lower urinary tract symptoms: Secondary | ICD-10-CM | POA: Diagnosis not present

## 2019-09-06 DIAGNOSIS — N319 Neuromuscular dysfunction of bladder, unspecified: Secondary | ICD-10-CM | POA: Diagnosis not present

## 2019-09-06 DIAGNOSIS — D509 Iron deficiency anemia, unspecified: Secondary | ICD-10-CM | POA: Diagnosis not present

## 2019-09-06 DIAGNOSIS — Z933 Colostomy status: Secondary | ICD-10-CM | POA: Diagnosis not present

## 2019-09-06 DIAGNOSIS — I10 Essential (primary) hypertension: Secondary | ICD-10-CM | POA: Diagnosis not present

## 2019-09-06 DIAGNOSIS — L89154 Pressure ulcer of sacral region, stage 4: Secondary | ICD-10-CM | POA: Diagnosis not present

## 2019-09-06 DIAGNOSIS — G8220 Paraplegia, unspecified: Secondary | ICD-10-CM | POA: Diagnosis not present

## 2019-09-06 DIAGNOSIS — S24104S Unspecified injury at T11-T12 level of thoracic spinal cord, sequela: Secondary | ICD-10-CM | POA: Diagnosis not present

## 2019-09-06 DIAGNOSIS — J309 Allergic rhinitis, unspecified: Secondary | ICD-10-CM | POA: Diagnosis not present

## 2019-09-11 DIAGNOSIS — N319 Neuromuscular dysfunction of bladder, unspecified: Secondary | ICD-10-CM | POA: Diagnosis not present

## 2019-09-11 DIAGNOSIS — L89154 Pressure ulcer of sacral region, stage 4: Secondary | ICD-10-CM | POA: Diagnosis not present

## 2019-09-11 DIAGNOSIS — N400 Enlarged prostate without lower urinary tract symptoms: Secondary | ICD-10-CM | POA: Diagnosis not present

## 2019-09-11 DIAGNOSIS — I10 Essential (primary) hypertension: Secondary | ICD-10-CM | POA: Diagnosis not present

## 2019-09-11 DIAGNOSIS — G8220 Paraplegia, unspecified: Secondary | ICD-10-CM | POA: Diagnosis not present

## 2019-09-11 DIAGNOSIS — Z933 Colostomy status: Secondary | ICD-10-CM | POA: Diagnosis not present

## 2019-09-11 DIAGNOSIS — S24104S Unspecified injury at T11-T12 level of thoracic spinal cord, sequela: Secondary | ICD-10-CM | POA: Diagnosis not present

## 2019-09-11 DIAGNOSIS — J309 Allergic rhinitis, unspecified: Secondary | ICD-10-CM | POA: Diagnosis not present

## 2019-09-11 DIAGNOSIS — D509 Iron deficiency anemia, unspecified: Secondary | ICD-10-CM | POA: Diagnosis not present

## 2019-09-12 ENCOUNTER — Encounter: Payer: Medicare HMO | Attending: Physician Assistant | Admitting: Physician Assistant

## 2019-09-12 ENCOUNTER — Other Ambulatory Visit: Payer: Self-pay

## 2019-09-12 DIAGNOSIS — G8220 Paraplegia, unspecified: Secondary | ICD-10-CM | POA: Diagnosis not present

## 2019-09-12 DIAGNOSIS — M8668 Other chronic osteomyelitis, other site: Secondary | ICD-10-CM | POA: Insufficient documentation

## 2019-09-12 DIAGNOSIS — T1490XS Injury, unspecified, sequela: Secondary | ICD-10-CM | POA: Insufficient documentation

## 2019-09-12 DIAGNOSIS — I10 Essential (primary) hypertension: Secondary | ICD-10-CM | POA: Insufficient documentation

## 2019-09-12 DIAGNOSIS — L89154 Pressure ulcer of sacral region, stage 4: Secondary | ICD-10-CM | POA: Insufficient documentation

## 2019-09-12 NOTE — Progress Notes (Addendum)
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 get him switch to advanced homecare in regard to home help from his current company. I'm not sure the reasoning behind but for some reason he was not accepted as a patient with him. Continue to apply the Wound VAC which does still show that some maceration around the wound edges but the wound measurements were slightly improved. No fevers, chills, nausea, or vomiting noted at this time. 02/14/17; this patient I have not seen in 5 months although he has been readmitted to our  clinic seen by our physician assistant Jeri Cos twice in early August. I have looked through [facility] notes care everywhere. The patient saw plastic surgery in May [Dr. Bhatt}. The patient was sent to general surgery and ultimately had a colostomy placed. On 11/29/16. This was after he was admitted to [facility] sometime in May. An MRI of the pelvis on 5/23 showed osteomyelitis of the coccyx. An attempt was made to drain fluid that was not successful. He was treated with empiric broad-spectrum antibiotics VAC/cefepime/Flagyl starting on 11/02/16 with plans for a 6 week course. According to their notes he was sent to a nursing home. Was last seen by Dr. Myriam Jacobson of plastic surgery on 12/28/16. The first part of the note is a long dissertation about the difficulties finding adequate patients for flap closure of pressure ulcers. At that time the wound was noted to be stage IV based I think on underlying infection no exposed bone and healthy granulation tissue. 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 He was last seen by infectious disease 01/12/17 A Bradley [name], Bradley [name] ([id number]) Dr. Uvaldo Rising. His note says that Mr. Beymer was not interested in a flap closure for referring a trial of the wound VAC. As previously anticipated the wound VAC could not be maintained as an outpatient in the community. He is now using something similar to a Dakin's wet to dry recommended  by Susy Frizzle solution. He is placing this twice a day himself. This is almost s hopeless setting in terms of heeling 02/28/17; he is using a Dakin's wet to dry. Most of the wound surface looks satisfactory however the deeper area over his coccyx now has exposed bone I'm not sure if I noted this last week. 03/21/17; patient is usingVASHE solution wet to dry which I gather is a variation on Dakin solution. He has home health changing this 3 times a week the other days he does this himself. His appointment with plastic  surgery 04/18/17; patient continues to use a variant of Dakin solution I believe. His wound continues to have a clean viable surface. The 2 areas of exposed bone in the center of this wound had closed over. He has an appointment with plastic surgery on December 5 at which time I hope that there'll be a plan for myocutaneous flap closure In looking through [facility] link I couldn't find any more plastic surgery appointments. I did come across the fact that he is been followed by hematology for a microcytic hypochromic anemia. He had a reasonably normal looking hemoglobin electrophoresis. His iron level was 10 and according to the patient he is going for IV iron infusions starting tomorrow. He had a sedimentation rate of 74. More problematically from a pure wound care point of view his albumin was 2.7 earlier this month 05/17/17; this is a patient I follow monthly. He has a large now stage IV wound over his bilateral buttocks with close proximity to his anal opening. More recently he has developed a large area with exposed bone in the center of this probably secondary to the underlying osteomyelitis E had in the summer. He also follows with Dr. Myriam Jacobson at [facility] who is plastic surgery. He had an appointment earlier this month and according to the patient Dr. Myriam Jacobson does not want to proceed with any attempted flap closure. Although I do not have current access to her note in care everywhere this is likely due to exposed bone. Again according to the patient they did a bone biopsy. He is still using a variant of Dakin solution changing twice a day. He has home health. The patient is not able to give me a firm answer about how long he spends on this in his wheelchair The patient also states that Dr. Myriam Jacobson wanted to reconsider a wound VAC. I really don't see this as a viable option at least not in the outpatient setting. The wound itself is frankly to close to his anal opening to maintain a seal. The last  time we tried to do this home health was unable to manage it. It might be possible to maintain a wound VAC in this setting outside of the home such as a skilled nursing facility or an [place] however I am doubtful about this even in that setting **** READMISSION 09/21/17-He is here for evaluation of stage IV sacral ulcer. Since his last evaluation here in December he has completed treatment for sacral osteomyelitis. He was at [facility] for IV therapy and NPWT dressing changes. He was discharged, with home health services, in February. He admits that while in the skilled facility he had "80%" success with maintaining dressing, since discharge he has had approximately "40%" success with maintaining wound VAC dressing. We discussed at length that this is not a safe or recommended option. We will apply Dakin's wet to dry dressing daily and he will follow-up next week. He is accompanied  today by his sister who is willing to assist in dressing changes; they will discuss the social issue as he feels he is capable of changing dressing daily when home health is not able. 09/28/17-He is here in follow-up evaluation for stage IV sacral pressure ulcer. He has been using the Dakin's wet-to-dry daily; he continues with home health. He is not accompanied by anyone at this visit. He will follow up in two weeks per his request/preference. 10/12/17 on evaluation today patient appears to be doing very well. The Dakin solution went to dry packings do seem to be helping him as far as the sacral wound is concerned I'm not seeing anything that has me more concerned as far as infection or otherwise is concerned. Overall I'm pleased with the appearance of the wound. 10/26/17-He is here in follow up evaluation for a stage IV sacral ulcer. He continues with daily Dakin's wet-to-dry. He is voicing no complaints or concerns. He will follow-up in 2 weeks 11/16/17-He is here for follow up evaluation for a palliative stage IV  sacral pressure ulcer. We will continue with Dakin's wet-to-dry. He will follow-up in 4 weeks. He is expressing concern/complaint regarding new bed that has arrived, stating he is unable to manipulate/maneuver it due to the bed crank being at the foot of the bed. 12/14/17-He is here for evaluation for palliative stage IV sacral ulcer. He is voicing no complaints or concerns. We will continue with one-to-one ratio of saline and dakins. He will follow-up in 3 weeks 01/04/18-He is here in follow-up evaluation for palliative stage IV sacral ulcer. He is inquiring about reinstating the negative pressure wound therapy. We discussed at length that the negative pressure wound therapy in the home setting has not been successful for him repeatedly with loss of cereal and unavailability of 24/7 help; reminded him that home health is not available 24/7 when loss of seal occurs. He does verbalize understanding to this and does not pursue. We also discussed the palliative nature of this ulcer (given no significant change/improvement in measurement/appearance, not a candidate for muscle flap per plastic surgery, and continued independent living) and that the goal is for maintenance, decrease in infection and minimizing/avoiding deterioration given that he is independent in his care, does not have home health and requires daily dressing changes secondary to drainage amount. He is inquiring about a wound clinic in [facility], I have informed him that I am unfamiliar with that clinic but that he is encouraged to seek another opinion if that is his desire. We will continue with dakins and he will follow up in three weeks 01/25/18-He is here in follow-up evaluation for palliative stage IV sacral ulcer. He continues with Dakin's/saline 1:1 mixture wet to dry dressing changes. He states he has an appointment at [facility] on 9/17 for evaluation of surgical intervention/closure of the sacral ulcer. He will follow-up here in 4  weeks Readmission: 07/16/2019 upon evaluation today patient appears to be doing really about the same as when he was previously seen here in the wound care center. He most recently was a patient of [place] back when she was still working here in the center but had been referred to [facility] for consideration of a flap. With that being said the surgeon there at [facility] stated that this was too large for her flap and they have been attempting to get this smaller in order to be able to proceed with a flap. Nonetheless unfortunately he has had a cycle of going back and forth between  the osteomyelitis flaring and then sent him back and then making a little bit of progress only to be sent back again. It sounds like most recently they have been using a Iodoflex type dressing at this point which does not seem to have done any harm by any means. With that being said this wound seems to be quite large for using Iodoflex throughout and subsequently I think he may do much better with the use of Vioxx moistened gauze which would be safe for the new tissue growing and also keep the wound quite nice and clean. The patient is not opposed to this he in fact states that his home health nurse had mentioned this as a [place]. ([id number]) possibility as well. 07/23/2019 upon evaluation today patient actually appears to be doing very well in regard to his sacral wound. In fact this is much healthier the measurements not terribly different but again with a wound like this at home necessarily expect a a lot of change as far as the overall measurements are concerned in just 1 week's time. This is going be a much longer term process at this point. With that being said I do think that he is very healthy appearing as far as the base of the wound is concerned. 08/06/2019 upon evaluation today patient actually appears to be doing about the same with regard to his wound to be honest. There is really not a significant improvement overall  based on what I am seeing today. Fortunately there is no signs of significant systemic infection. No fevers, chills, nausea, vomiting, or diarrhea. With that being said there is odor to the drainage from the wound and subsequently also what appears to be increased drainage based on what we are seeing today as well as what his home health nurse called [place] about that she was concerned with as well. 08/13/2019 upon evaluation today patient appears to be doing about the same at this point with regard to his wound although I think the dressing may be a little less drainage wise compared to what it was previous. Fortunately there is no signs of active infection at this time. No fevers, chills, nausea, vomiting, or diarrhea. He has been taking the antibiotic for only 3 days. 08/23/2019 upon evaluation today patient appears to be doing not nearly as well with regard to his wound. In general really not making a lot of progress which is unfortunate. There does not appear to be any signs of active infection at this time. No fevers, chills, nausea, vomiting, or diarrhea. With that being said the patient unfortunately does seem to be experiencing continued epiboly around the edges of the wound as well as significant scar tissue he has been in the majority of his day sitting in his chair which is likely a big reason for all of this. Fortunately there is no signs of active infection at this time. No fevers, chills, nausea, vomiting, or diarrhea. 08/29/2019 upon evaluation today patient's wound actually appears to be showing some signs of improvement. The region of few areas of new skin growth around the edges of the wound even where he has some of the epiboly. This is actually good news and I think that he has been very aggressive and offloading over the past week since we showed him the pictures of his wound last week. There still may be a chance that he is going require some sharp debridement to clear away some of these  edges but to be honest I  think that is can be quite an undertaking. The main reason is that he has a lot of thickened scar tissue and it is good to bleed quite significantly in my opinion. The I think if organ to do that we may even need to have a portable or disposable cautery in order to make sure that we are able to get the area completely sealed up as far as bleeding is concerned. I do have one that we can utilize. However being that the wound looks so much better right now would like to give this 2 weeks to see how things stand and look at that point before making any additional recommendations. 09/12/2019 upon evaluation today patient appears to be making some progress as far as offloading is concerned. He still has a substantial wound but nonetheless he tells me he is off loading much more aggressively at this point. This is obviously good news. No fevers, chills, nausea, vomiting, or diarrhea. Objective Constitutional Obese and well-hydrated in no acute distress. Vitals Time Taken: 10:53 AM, Temperature: 97.8 F, Pulse: 68 bpm, Respiratory Rate: 16 breaths/min, Blood Pressure: 129/59 mmHg. Respiratory normal breathing without difficulty. Psychiatric this patient is able to make decisions and demonstrates good insight into disease process. Alert and Oriented x 3. pleasant and cooperative. General Notes: Patient's wound bed currently showed signs of good granulation at this time. Fortunately there is no evidence of active infection which is great news. I did perform some sharp debridement around the edges of the wound in order to try to clear away some of the rolled tissue. Some of this is just waiting to think and scarred for me to be able to do a lot with secondary to the fact that unfortunately he has a lot of bleeding even with minimal intervention here. Nonetheless I do believe that he may have to have some additional debridements here and there to try to keep this as free and clear as  far as the edges of the wound are concerned to allow for new epithelial growth. Hopefully it will not have to be that significant and hopefully he will start to notice more epithelization. Integumentary (Hair, Skin) Wound #10 status is Open. Original cause of wound was Pressure Injury. The wound is located on the Midline Sacrum. The wound measures 12.5cm length x 7.4cm width x 5cm depth; 72.649cm^2 area and 363.247cm^3 volume. There is Fat Layer (Subcutaneous Tissue) Exposed exposed. There is no tunneling or undermining noted. There is a medium amount of serosanguineous drainage noted. The wound margin is epibole. There is large (67- 100%) red granulation within the wound bed. There is a small (1-33%) amount of necrotic tissue within the wound bed including Adherent Slough. Bradley [name], Bradley [name] ([id number]) Assessment Active Problems ICD-10 Pressure ulcer of sacral region, stage 4 Other chronic osteomyelitis, other site Paraplegia, unspecified Procedures Wound #10 Pre-procedure diagnosis of Wound #10 is a Pressure Ulcer located on the Midline Sacrum . There was a Excisional Skin/Subcutaneous Tissue Debridement with a total area of 6 sq cm performed by STONE III, Zyan Coby E., PA-C. With the following instrument(s): Curette to remove Viable and Non-Viable tissue/material. Material removed includes Subcutaneous Tissue and Slough and after achieving pain control using Lidocaine. A time out was conducted at 11:28, prior to the start of the procedure. A Minimum amount of bleeding was controlled with Pressure. The procedure was tolerated well. Post Debridement Measurements: 12.5cm length x 7.4cm width x 5cm depth; 363.247cm^3 volume. Post debridement Stage noted as Category/Stage IV. Character of Wound/Ulcer  Post Debridement is stable. Post procedure Diagnosis Wound #10: Same as Pre-Procedure Plan Wound Cleansing: Wound #10 Midline Sacrum: Clean wound with Normal Saline. Dial antibacterial soap, wash  wounds, rinse and pat dry prior to dressing wounds Primary Wound Dressing: Wound #10 Midline Sacrum: Other: - Vashe moistened gauze (Dakins in clinic) Secondary Dressing: Wound #10 Midline Sacrum: ABD pad - secure with tape XtraSorb Dressing Change Frequency: Wound #10 Midline Sacrum: Change dressing every day. - Patient's son to change when [facility] does not visit Follow-up Appointments: Return Appointment in 1 week. Off-Loading: Wound #10 Midline Sacrum: Gel wheelchair cushion Turn and reposition every 2 hours Home Health: Wound #10 Midline Sacrum: [place] Nurse may visit PRN to address patient s wound care needs. FACE TO FACE ENCOUNTER: MEDICARE and MEDICAID PATIENTS: I certify that this patient is under my care and that I had a face-to-face encounter that meets the physician face-to-face encounter requirements with this patient on this date. The encounter with the patient was in whole or in part for the following MEDICAL CONDITION: (primary reason for [place]) MEDICAL NECESSITY: I certify, that based on my findings, NURSING services are a medically necessary home health service. HOME BOUND STATUS: I certify that my clinical findings support that this patient is homebound (i.e., Due to illness or injury, pt requires aid of supportive devices such as crutches, cane, wheelchairs, walkers, the use of special transportation or the assistance of another person to leave their place of residence. There is a normal inability to leave the home and doing so requires considerable and taxing effort. Other absences are for medical reasons / religious services and are infrequent or of short duration when for other reasons). If current dressing causes regression in wound condition, may D/C ordered dressing product/s and apply Normal Saline Moist Dressing daily until next Bradley [name], HENNIGAN ([id number]) [place] / Other MD appointment. [place] of regression in wound condition at [phone]. Please direct any NON-WOUND related issues/requests for orders to patient's Primary Care Physician 1. I would recommend currently that we continue to make Dakin's moistened gauze at this point. 2. I am also can recommend that we go ahead and continue to monitor for any signs of infection though I feel like he is doing fairly well at this point. 3. I am also can recommend continued and appropriate aggressive offloading he needs to spend less than 2 hours in my opinion up in his chair. 4. Also think that we may need to consider referral to a plastic surgeon at some point but again were not quite at that point currently. We will see patient back for reevaluation in 1 week here in the clinic. If anything worsens or changes patient will contact our office for additional recommendations. Electronic Signature(s) Signed: 09/12/2019 6:06:15 PM By: Worthy Keeler PA-C Entered By: Worthy Keeler on 09/12/2019 18:06:15 Bradley [name] ([id number]) -------------------------------------------------------------------------------- SuperBill Details Patient Name: Bradley [name] Date of Service: 09/12/2019 Medical Record Number: [id number] Patient Account Number: [id number] Date of Birth/Sex: 1946-02-03 (74 y.o. M) Treating RN: Army Melia Primary Care Provider: [name] Other Clinician: Referring Provider: [name] Treating Provider/Extender: [name], Evvie Behrmann Weeks in Treatment: 8 Diagnosis Coding ICD-10 Codes Code Description L89.154 Pressure ulcer of sacral region, stage 4 M86.68 Other chronic osteomyelitis, other site G82.20 Paraplegia, unspecified Facility Procedures CPT4 Code: [id number] Description: 11042 - DEB SUBQ TISSUE 20 SQ CM/< Modifier: Quantity: 1 CPT4 Code: Description: ICD-10 Diagnosis Description L89.154 Pressure  ulcer of sacral region, stage 4 Modifier: Quantity: Physician Procedures CPT4 Code:  [id number] Description: 11042 - WC PHYS SUBQ TISS 20 SQ CM Modifier: Quantity: 1 CPT4 Code: Description: ICD-10 Diagnosis Description L89.154 Pressure ulcer of sacral region, stage 4 Modifier: Quantity: Electronic Signature(s) Signed: 09/12/2019 6:06:26 PM By: Worthy Keeler PA-C Entered By: Worthy Keeler on 09/12/2019 18:06:25

## 2019-09-13 DIAGNOSIS — S24104S Unspecified injury at T11-T12 level of thoracic spinal cord, sequela: Secondary | ICD-10-CM | POA: Diagnosis not present

## 2019-09-13 DIAGNOSIS — N400 Enlarged prostate without lower urinary tract symptoms: Secondary | ICD-10-CM | POA: Diagnosis not present

## 2019-09-13 DIAGNOSIS — I10 Essential (primary) hypertension: Secondary | ICD-10-CM | POA: Diagnosis not present

## 2019-09-13 DIAGNOSIS — Z933 Colostomy status: Secondary | ICD-10-CM | POA: Diagnosis not present

## 2019-09-13 DIAGNOSIS — G8220 Paraplegia, unspecified: Secondary | ICD-10-CM | POA: Diagnosis not present

## 2019-09-13 DIAGNOSIS — D509 Iron deficiency anemia, unspecified: Secondary | ICD-10-CM | POA: Diagnosis not present

## 2019-09-13 DIAGNOSIS — N319 Neuromuscular dysfunction of bladder, unspecified: Secondary | ICD-10-CM | POA: Diagnosis not present

## 2019-09-13 DIAGNOSIS — L89154 Pressure ulcer of sacral region, stage 4: Secondary | ICD-10-CM | POA: Diagnosis not present

## 2019-09-13 DIAGNOSIS — J309 Allergic rhinitis, unspecified: Secondary | ICD-10-CM | POA: Diagnosis not present

## 2019-09-13 NOTE — Progress Notes (Signed)
MANCEL, KINTZEL ([id number]) Visit Report for 09/12/2019 Arrival Information Details Patient Name: Bradley [name], Bradley [name]. Date of Service: 09/12/2019 10:45 AM Medical Record Number: [id number] Patient Account Number: [id number] Date of Birth/Sex: [birth year] (74 y.o. M) Treating RN: Montey Hora Primary Care Breaunna Gottlieb: [name] Other Clinician: Referring Jazmine Heckman: [name] Treating Shandon Matson/Extender: [name], HOYT Weeks in Treatment: 8 Visit Information History Since Last Visit Added or deleted any medications: No Patient Arrived: Wheel Chair Any new allergies or adverse reactions: No Arrival Time: 10:52 Had a fall or experienced change in No Accompanied By: self activities of daily living that may affect Transfer Assistance: Harrel Lemon Lift risk of falls: Patient Identification Verified: Yes Signs or symptoms of abuse/neglect since last visito No Secondary Verification Process Completed: Yes Hospitalized since last visit: No Implantable device outside of the clinic excluding No cellular tissue based products placed in the center since last visit: Has Dressing in Place as Prescribed: Yes Pain Present Now: No Electronic Signature(s) Signed: 09/12/2019 4:13:45 PM By: Montey Hora Entered By: Montey Hora on 09/12/2019 10:53:30 [name] -------------------------------------------------------------------------------- Encounter Discharge Information Details Patient Name: Bradley Loa A. Date of Service: 09/12/2019 10:45 AM Medical Record Number: [id number] Patient Account Number: [id number] Date of Birth/Sex: [birth year] (74 y.o. M) Treating RN: Army Melia Primary Care Jaydence Arnesen: [name] Other Clinician: Referring Aleila Syverson: [name] Treating Raegyn Renda/Extender: [name], HOYT Weeks in Treatment: 8 Encounter Discharge Information Items Post Procedure Vitals Discharge Condition: Stable Temperature (F): 97.8 Ambulatory Status:  Wheelchair Pulse (bpm): 68 Discharge Destination: Home Respiratory Rate (breaths/min): 16 Transportation: Private Auto Blood Pressure (mmHg): 129/59 Accompanied By: self Schedule Follow-up Appointment: Yes Clinical Summary of Care: Electronic Signature(s) Signed: 09/12/2019 1:30:06 PM By: Army Melia Entered By: Army Melia on 09/12/2019 11:34:30 [name] -------------------------------------------------------------------------------- Lower Extremity Assessment Details Patient Name: Bradley Loa A. Date of Service: 09/12/2019 10:45 AM Medical Record Number: [id number] Patient Account Number: [id number] Date of Birth/Sex: 1946-05-15 (74 y.o. M) Treating RN: Montey Hora Primary Care Haydan Wedig: [name] Other Clinician: Referring Elika Godar: [name] Treating Devonn Giampietro/Extender: [name], HOYT Weeks in Treatment: 8 Electronic Signature(s) Signed: 09/12/2019 4:13:45 PM By: Montey Hora Entered By: Montey Hora on 09/12/2019 10:53:37 [name] -------------------------------------------------------------------------------- Multi Wound Chart Details Patient Name: Bradley Loa A. Date of Service: 09/12/2019 10:45 AM Medical Record Number: [id number] Patient Account Number: [id number] Date of Birth/Sex: 29-Mar-1946 (74 y.o. M) Treating RN: Army Melia Primary Care Luella Gardenhire: [name] Other Clinician: Referring Kc Summerson: [name] Treating Trayvion Embleton/Extender: [name], HOYT Weeks in Treatment: 8 Vital Signs Height(in): Pulse(bpm): 31 Weight(lbs): Blood Pressure(mmHg): 129/59 Body Mass Index(BMI): Temperature(F): 97.8 Respiratory Rate(breaths/min): 16 Photos: [N/A:N/A] Wound Location: Midline Sacrum N/A N/A Wounding Event: Pressure Injury N/A N/A Primary Etiology: Pressure Ulcer N/A N/A Comorbid History: Anemia, Hypertension, History of N/A N/A pressure wounds, Rheumatoid Arthritis, Paraplegia Date  Acquired: 06/07/2015 N/A N/A Weeks of Treatment: 8 N/A N/A Wound Status: Open N/A N/A Measurements L x W x D (cm) 12.5x7.4x5 N/A N/A Area (cm) : 72.649 N/A N/A Volume (cm) : 363.247 N/A N/A % Reduction in Area: -41.00% N/A N/A % Reduction in Volume: -17.50% N/A N/A Classification: Category/Stage IV N/A N/A Exudate Amount: Medium N/A N/A Exudate Type: Serosanguineous N/A N/A Exudate Color: red, brown N/A N/A Wound Margin: Epibole N/A N/A Granulation Amount: Large (67-100%) N/A N/A Granulation Quality: Red N/A N/A Necrotic Amount: Small (1-33%) N/A N/A Exposed Structures: Fat Layer (Subcutaneous Tissue) N/A N/A Exposed: Yes Fascia: No Tendon: No Muscle: No Joint: No Bone:  No Epithelialization: None N/A N/A Treatment Notes Electronic Signature(s) Signed: 09/12/2019 1:30:06 PM By: Army Melia Entered By: Army Melia on 09/12/2019 11:28:24 Bradley [name], Bradley [name] ([id number]) Bradley [name], Bradley [name] ([id number]) -------------------------------------------------------------------------------- [place] Details Patient Name: Bradley [name], Bradley A. Date of Service: 09/12/2019 10:45 AM Medical Record Number: [id number] Patient Account Number: [id number] Date of Birth/Sex: 1945/09/15 (74 y.o. M) Treating RN: Army Melia Primary Care Gianluca Chhim: [name] Other Clinician: Referring Miaa Latterell: [name] Treating Humza Tallerico/Extender: [name], HOYT Weeks in Treatment: 8 Active Inactive Abuse / Safety / Falls / Self Care Management Nursing Diagnoses: Impaired physical mobility Goals: Patient will not develop complications from immobility Date Initiated: 07/16/2019 Target Resolution Date: 10/12/2019 Goal Status: Active Interventions: Assess Activities of Daily Living upon admission and as needed Notes: Orientation to the Wound Care Program Nursing Diagnoses: Knowledge deficit related to the wound healing center program Goals: Patient/caregiver will verbalize  understanding of the [facility] Date Initiated: 07/16/2019 Target Resolution Date: 10/12/2019 Goal Status: Active Interventions: Provide education on orientation to the wound center Notes: Pressure Nursing Diagnoses: Knowledge deficit related to causes and risk factors for pressure ulcer development Goals: Patient will remain free from development of additional pressure ulcers Date Initiated: 07/16/2019 Target Resolution Date: 10/12/2019 Goal Status: Active Interventions: Provide education on pressure ulcers Notes: Wound/Skin Impairment Nursing Diagnoses: Impaired tissue integrity Bradley [name], Bradley [name] ([id number]) Goals: Ulcer/skin breakdown will heal within 14 weeks Date Initiated: 07/16/2019 Target Resolution Date: 10/12/2019 Goal Status: Active Interventions: Assess patient/caregiver ability to obtain necessary supplies Assess patient/caregiver ability to perform ulcer/skin care regimen upon admission and as needed Assess ulceration(s) every visit Notes: Electronic Signature(s) Signed: 09/12/2019 1:30:06 PM By: Army Melia Entered By: Army Melia on 09/12/2019 11:28:04 [name] -------------------------------------------------------------------------------- Pain Assessment Details Patient Name: Bradley Loa A. Date of Service: 09/12/2019 10:45 AM Medical Record Number: [id number] Patient Account Number: [id number] Date of Birth/Sex: August 18, 1945 (74 y.o. M) Treating RN: Montey Hora Primary Care Pandora Mccrackin: [name] Other Clinician: Referring Dayden Viverette: [name] Treating Rochanda Harpham/Extender: [name], HOYT Weeks in Treatment: 8 Active Problems Location of Pain Severity and Description of Pain Patient Has Paino No Site Locations Pain Management and Medication Current Pain Management: Electronic Signature(s) Signed: 09/12/2019 4:13:45 PM By: Montey Hora Entered By: Montey Hora on 09/12/2019 10:53:44 [name]  ([id number]) -------------------------------------------------------------------------------- Patient/Caregiver Education Details Patient Name: Bradley Loa A. Date of Service: 09/12/2019 10:45 AM Medical Record Number: [id number] Patient Account Number: [id number] Date of Birth/Gender: [birth year] (74 y.o. M) Treating RN: Army Melia Primary Care Physician: [name] Other Clinician: Referring Physician: [name] Treating Physician/Extender: Sharalyn Ink in Treatment: 8 Education Assessment Education Provided To: Patient Education Topics Provided Wound/Skin Impairment: Handouts: Caring for Your Ulcer Methods: Demonstration, Explain/Verbal Responses: State content correctly Electronic Signature(s) Signed: 09/12/2019 1:30:06 PM By: Army Melia Entered By: Army Melia on 09/12/2019 11:33:52 [name] -------------------------------------------------------------------------------- Wound Assessment Details Patient Name: Bradley Loa A. Date of Service: 09/12/2019 10:45 AM Medical Record Number: [id number] Patient Account Number: [id number] Date of Birth/Sex: Feb 23, 1946 (74 y.o. M) Treating RN: Montey Hora Primary Care Jimena Wieczorek: [name] Other Clinician: Referring Kailynn Satterly: [name] Treating Ala Capri/Extender: [name], HOYT Weeks in Treatment: 8 Wound Status Wound Number: 10 Primary Pressure Ulcer Etiology: Wound Location: Midline Sacrum Wound Status: Open Wounding Event: Pressure Injury Comorbid Anemia, Hypertension, History of pressure wounds, Date Acquired: 06/07/2015 History: Rheumatoid Arthritis, Paraplegia Weeks Of Treatment: 8 Clustered Wound: No Photos Wound Measurements Length: (cm) 12.5 % Red Width: (cm) 7.4 %  Red Depth: (cm) 5 Epith Area: (cm) 72.649 Tunn Volume: (cm) 363.247 Unde uction in Area: -41% uction in Volume: -17.5% elialization: None eling: No rmining: No Wound  Description Classification: Category/Stage IV Foul Wound Margin: Epibole Slou Exudate Amount: Medium Exudate Type: Serosanguineous Exudate Color: red, brown Odor After Cleansing: No gh/Fibrino Yes Wound Bed Granulation Amount: Large (67-100%) Exposed Structure Granulation Quality: Red Fascia Exposed: No Necrotic Amount: Small (1-33%) Fat Layer (Subcutaneous Tissue) Exposed: Yes Necrotic Quality: Adherent Slough Tendon Exposed: No Muscle Exposed: No Joint Exposed: No Bone Exposed: No Treatment Notes Wound #10 (Midline Sacrum) Notes dakins moistened gauze, xtrasorb, abd and tape Electronic Signature(s) Signed: 09/12/2019 4:13:45 PM By: Gaynelle Adu ([id number]) Entered By: Montey Hora on 09/12/2019 11:07:49 [name] -------------------------------------------------------------------------------- Vitals Details Patient Name: Bradley Loa A. Date of Service: 09/12/2019 10:45 AM Medical Record Number: [id number] Patient Account Number: [id number] Date of Birth/Sex: 1946-02-06 (74 y.o. M) Treating RN: Montey Hora Primary Care Amberlie Gaillard: [name] Other Clinician: Referring Tyshauna Finkbiner: [name] Treating Keni Wafer/Extender: [name], HOYT Weeks in Treatment: 8 Vital Signs Time Taken: 10:53 Temperature (F): 97.8 Pulse (bpm): 68 Respiratory Rate (breaths/min): 16 Blood Pressure (mmHg): 129/59 Reference Range: 80 - 120 mg / dl Electronic Signature(s) Signed: 09/12/2019 4:13:45 PM By: Montey Hora Entered By: Montey Hora on 09/12/2019 10:54:20

## 2019-09-14 DIAGNOSIS — N319 Neuromuscular dysfunction of bladder, unspecified: Secondary | ICD-10-CM | POA: Diagnosis not present

## 2019-09-16 DIAGNOSIS — S24104S Unspecified injury at T11-T12 level of thoracic spinal cord, sequela: Secondary | ICD-10-CM | POA: Diagnosis not present

## 2019-09-16 DIAGNOSIS — D509 Iron deficiency anemia, unspecified: Secondary | ICD-10-CM | POA: Diagnosis not present

## 2019-09-16 DIAGNOSIS — N400 Enlarged prostate without lower urinary tract symptoms: Secondary | ICD-10-CM | POA: Diagnosis not present

## 2019-09-16 DIAGNOSIS — L89154 Pressure ulcer of sacral region, stage 4: Secondary | ICD-10-CM | POA: Diagnosis not present

## 2019-09-16 DIAGNOSIS — G8220 Paraplegia, unspecified: Secondary | ICD-10-CM | POA: Diagnosis not present

## 2019-09-16 DIAGNOSIS — J309 Allergic rhinitis, unspecified: Secondary | ICD-10-CM | POA: Diagnosis not present

## 2019-09-16 DIAGNOSIS — I10 Essential (primary) hypertension: Secondary | ICD-10-CM | POA: Diagnosis not present

## 2019-09-16 DIAGNOSIS — N319 Neuromuscular dysfunction of bladder, unspecified: Secondary | ICD-10-CM | POA: Diagnosis not present

## 2019-09-16 DIAGNOSIS — Z933 Colostomy status: Secondary | ICD-10-CM | POA: Diagnosis not present

## 2019-09-18 DIAGNOSIS — N400 Enlarged prostate without lower urinary tract symptoms: Secondary | ICD-10-CM | POA: Diagnosis not present

## 2019-09-18 DIAGNOSIS — Z933 Colostomy status: Secondary | ICD-10-CM | POA: Diagnosis not present

## 2019-09-18 DIAGNOSIS — G8220 Paraplegia, unspecified: Secondary | ICD-10-CM | POA: Diagnosis not present

## 2019-09-18 DIAGNOSIS — J309 Allergic rhinitis, unspecified: Secondary | ICD-10-CM | POA: Diagnosis not present

## 2019-09-18 DIAGNOSIS — L89154 Pressure ulcer of sacral region, stage 4: Secondary | ICD-10-CM | POA: Diagnosis not present

## 2019-09-18 DIAGNOSIS — I10 Essential (primary) hypertension: Secondary | ICD-10-CM | POA: Diagnosis not present

## 2019-09-18 DIAGNOSIS — D509 Iron deficiency anemia, unspecified: Secondary | ICD-10-CM | POA: Diagnosis not present

## 2019-09-18 DIAGNOSIS — S24104S Unspecified injury at T11-T12 level of thoracic spinal cord, sequela: Secondary | ICD-10-CM | POA: Diagnosis not present

## 2019-09-18 DIAGNOSIS — N319 Neuromuscular dysfunction of bladder, unspecified: Secondary | ICD-10-CM | POA: Diagnosis not present

## 2019-09-19 DIAGNOSIS — N319 Neuromuscular dysfunction of bladder, unspecified: Secondary | ICD-10-CM | POA: Diagnosis not present

## 2019-09-20 DIAGNOSIS — S24104S Unspecified injury at T11-T12 level of thoracic spinal cord, sequela: Secondary | ICD-10-CM | POA: Diagnosis not present

## 2019-09-20 DIAGNOSIS — J309 Allergic rhinitis, unspecified: Secondary | ICD-10-CM | POA: Diagnosis not present

## 2019-09-20 DIAGNOSIS — Z933 Colostomy status: Secondary | ICD-10-CM | POA: Diagnosis not present

## 2019-09-20 DIAGNOSIS — L89154 Pressure ulcer of sacral region, stage 4: Secondary | ICD-10-CM | POA: Diagnosis not present

## 2019-09-20 DIAGNOSIS — Z136 Encounter for screening for cardiovascular disorders: Secondary | ICD-10-CM | POA: Diagnosis not present

## 2019-09-20 DIAGNOSIS — D509 Iron deficiency anemia, unspecified: Secondary | ICD-10-CM | POA: Diagnosis not present

## 2019-09-20 DIAGNOSIS — N400 Enlarged prostate without lower urinary tract symptoms: Secondary | ICD-10-CM | POA: Diagnosis not present

## 2019-09-20 DIAGNOSIS — G8220 Paraplegia, unspecified: Secondary | ICD-10-CM | POA: Diagnosis not present

## 2019-09-20 DIAGNOSIS — N319 Neuromuscular dysfunction of bladder, unspecified: Secondary | ICD-10-CM | POA: Diagnosis not present

## 2019-09-20 DIAGNOSIS — I10 Essential (primary) hypertension: Secondary | ICD-10-CM | POA: Diagnosis not present

## 2019-09-23 DIAGNOSIS — S24104S Unspecified injury at T11-T12 level of thoracic spinal cord, sequela: Secondary | ICD-10-CM | POA: Diagnosis not present

## 2019-09-23 DIAGNOSIS — L89154 Pressure ulcer of sacral region, stage 4: Secondary | ICD-10-CM | POA: Diagnosis not present

## 2019-09-23 DIAGNOSIS — N400 Enlarged prostate without lower urinary tract symptoms: Secondary | ICD-10-CM | POA: Diagnosis not present

## 2019-09-23 DIAGNOSIS — Z933 Colostomy status: Secondary | ICD-10-CM | POA: Diagnosis not present

## 2019-09-23 DIAGNOSIS — D509 Iron deficiency anemia, unspecified: Secondary | ICD-10-CM | POA: Diagnosis not present

## 2019-09-23 DIAGNOSIS — G8220 Paraplegia, unspecified: Secondary | ICD-10-CM | POA: Diagnosis not present

## 2019-09-23 DIAGNOSIS — N319 Neuromuscular dysfunction of bladder, unspecified: Secondary | ICD-10-CM | POA: Diagnosis not present

## 2019-09-23 DIAGNOSIS — J309 Allergic rhinitis, unspecified: Secondary | ICD-10-CM | POA: Diagnosis not present

## 2019-09-23 DIAGNOSIS — I10 Essential (primary) hypertension: Secondary | ICD-10-CM | POA: Diagnosis not present

## 2019-09-25 DIAGNOSIS — G8220 Paraplegia, unspecified: Secondary | ICD-10-CM | POA: Diagnosis not present

## 2019-09-25 DIAGNOSIS — S24104S Unspecified injury at T11-T12 level of thoracic spinal cord, sequela: Secondary | ICD-10-CM | POA: Diagnosis not present

## 2019-09-25 DIAGNOSIS — N400 Enlarged prostate without lower urinary tract symptoms: Secondary | ICD-10-CM | POA: Diagnosis not present

## 2019-09-25 DIAGNOSIS — N319 Neuromuscular dysfunction of bladder, unspecified: Secondary | ICD-10-CM | POA: Diagnosis not present

## 2019-09-25 DIAGNOSIS — Z933 Colostomy status: Secondary | ICD-10-CM | POA: Diagnosis not present

## 2019-09-25 DIAGNOSIS — L89154 Pressure ulcer of sacral region, stage 4: Secondary | ICD-10-CM | POA: Diagnosis not present

## 2019-09-25 DIAGNOSIS — I10 Essential (primary) hypertension: Secondary | ICD-10-CM | POA: Diagnosis not present

## 2019-09-25 DIAGNOSIS — J309 Allergic rhinitis, unspecified: Secondary | ICD-10-CM | POA: Diagnosis not present

## 2019-09-25 DIAGNOSIS — D509 Iron deficiency anemia, unspecified: Secondary | ICD-10-CM | POA: Diagnosis not present

## 2019-09-26 ENCOUNTER — Other Ambulatory Visit: Payer: Self-pay

## 2019-09-26 ENCOUNTER — Encounter: Payer: Medicare HMO | Admitting: Internal Medicine

## 2019-09-26 DIAGNOSIS — I10 Essential (primary) hypertension: Secondary | ICD-10-CM | POA: Diagnosis not present

## 2019-09-26 DIAGNOSIS — L89154 Pressure ulcer of sacral region, stage 4: Secondary | ICD-10-CM | POA: Diagnosis not present

## 2019-09-26 DIAGNOSIS — T1490XS Injury, unspecified, sequela: Secondary | ICD-10-CM | POA: Diagnosis not present

## 2019-09-26 DIAGNOSIS — M8668 Other chronic osteomyelitis, other site: Secondary | ICD-10-CM | POA: Diagnosis not present

## 2019-09-26 DIAGNOSIS — G8220 Paraplegia, unspecified: Secondary | ICD-10-CM | POA: Diagnosis not present

## 2019-09-26 NOTE — Progress Notes (Signed)
HEZZIE, WILCKEN ([id number]) Visit Report for 09/26/2019 Arrival Information Details Patient Name: Bradley [name], Bradley [name]. Date of Service: 09/26/2019 10:30 AM Medical Record Number: [id number] Patient Account Number: [id number] Date of Birth/Sex: 12/15/1945 (74 y.o. M) Treating RN: Bradley [name] Primary Care Bradley [name]: Bradley [name] Other Clinician: Referring Bradley [name]: Bradley [name] Treating Bradley [name]/Extender: Bradley [name] in Treatment: 10 Visit Information History Since Last Visit Added or deleted any medications: No Patient Arrived: Wheel Chair Any new allergies or adverse reactions: No Arrival Time: 10:30 Pain Present Now: No Accompanied By: self Transfer Assistance: Bradley [name] Lift Patient Identification Verified: Yes Secondary Verification Process Completed: Yes Electronic Signature(s) Signed: 09/26/2019 5:10:21 PM By: Bradley [name] Entered By: Bradley [name], BSN, RN, CWS, Bradley [name] on 09/26/2019 10:31:15 Bradley [name] ([id number]) -------------------------------------------------------------------------------- Clinic Level of Care Assessment Details Patient Name: Bradley [name], Bradley A. Date of Service: 09/26/2019 10:30 AM Medical Record Number: [id number] Patient Account Number: [id number] Date of Birth/Sex: [birth year] (74 y.o. M) Treating RN: Bradley [name] Primary Care Bradley [name]: Bradley [name] Other Clinician: Referring Bradley [name]: Bradley [name] Treating Bradley [name]/Extender: Bradley [name] in Treatment: 10 Clinic Level of Care Assessment Items TOOL 4 Quantity Score []  - Use when only an EandM is performed on FOLLOW-UP visit 0 ASSESSMENTS - Nursing Assessment / Reassessment X - Reassessment of Co-morbidities (includes updates in patient status) 1 10 X- 1 5 Reassessment of Adherence to Treatment Plan ASSESSMENTS - Wound and Skin Assessment / Reassessment X - Simple Wound Assessment / Reassessment - one wound 1 5 []  - 0 Complex Wound Assessment /  Reassessment - multiple wounds []  - 0 Dermatologic / Skin Assessment (not related to wound area) ASSESSMENTS - Focused Assessment []  - Circumferential Edema Measurements - multi extremities 0 []  - 0 Nutritional Assessment / Counseling / Intervention []  - 0 Lower Extremity Assessment (monofilament, tuning fork, pulses) []  - 0 Peripheral Arterial Disease Assessment (using hand held doppler) ASSESSMENTS - Ostomy and/or Continence Assessment and Care []  - Incontinence Assessment and Management 0 []  - 0 Ostomy Care Assessment and Management (repouching, etc.) PROCESS - Coordination of Care X - Simple Patient / Family Education for ongoing care 1 15 []  - 0 Complex (extensive) Patient / Family Education for ongoing care []  - 0 Staff obtains Programmer, systems, Records, Test Results / Process Orders []  - 0 Staff telephones HHA, Nursing Homes / Clarify orders / etc []  - 0 Routine Transfer to another Facility (non-emergent condition) []  - 0 Routine Hospital Admission (non-emergent condition) []  - 0 New Admissions / Biomedical engineer / Ordering NPWT, Apligraf, etc. []  - 0 Emergency Hospital Admission (emergent condition) X- 1 10 Simple Discharge Coordination []  - 0 Complex (extensive) Discharge Coordination PROCESS - Special Needs []  - Pediatric / Minor Patient Management 0 []  - 0 Isolation Patient Management []  - 0 Hearing / Language / Visual special needs []  - 0 Assessment of Community assistance (transportation, D/C planning, etc.) [name], [name] ([id number]) []  - 0 Additional assistance / Altered mentation []  - 0 Support Surface(s) Assessment (bed, cushion, seat, etc.) INTERVENTIONS - Wound Cleansing / Measurement X - Simple Wound Cleansing - one wound 1 5 []  - 0 Complex Wound Cleansing - multiple wounds X- 1 5 Wound Imaging (photographs - any number of wounds) []  - 0 Wound Tracing (instead of photographs) X- 1 5 Simple Wound Measurement - one wound []  - 0 Complex  Wound Measurement - multiple wounds INTERVENTIONS - Wound Dressings []  - Small Wound Dressing one or multiple wounds 0 X- 1  15 Medium Wound Dressing one or multiple wounds []  - 0 Large Wound Dressing one or multiple wounds []  - 0 Application of Medications - topical []  - 0 Application of Medications - injection INTERVENTIONS - Miscellaneous []  - External ear exam 0 []  - 0 Specimen Collection (cultures, biopsies, blood, [name] fluids, etc.) []  - 0 Specimen(s) / Culture(s) sent or taken to Lab for analysis []  - 0 Patient Transfer (multiple staff / Civil Service fast streamer / Similar devices) []  - 0 Simple Staple / Suture removal (25 or less) []  - 0 Complex Staple / Suture removal (26 or more) []  - 0 Hypo / Hyperglycemic Management (close monitor of Blood Glucose) []  - 0 Ankle / Brachial Index (ABI) - do not check if billed separately X- 1 5 Vital Signs Has the patient been seen at the hospital within the last three years: Yes Total Score: 80 Level Of Care: New/Established - Level 3 Electronic Signature(s) Signed: 09/26/2019 3:53:36 PM By: Bradley [name] Entered By: Bradley [name] on 09/26/2019 11:03:55 Bradley [name] ([id number]) -------------------------------------------------------------------------------- Encounter Discharge Information Details Patient Name: Bradley Loa A. Date of Service: 09/26/2019 10:30 AM Medical Record Number: [id number] Patient Account Number: [id number] Date of Birth/Sex: 1946/02/18 (74 y.o. M) Treating RN: Bradley [name] Primary Care Yidel Teuscher: Bradley [name] Other Clinician: Referring Lynze Reddy: Bradley [name] Treating Bracen Schum/Extender: Bradley [name] in Treatment: 10 Encounter Discharge Information Items Discharge Condition: Stable Ambulatory Status: Wheelchair Discharge Destination: Home Transportation: Private Auto Accompanied By: self Schedule Follow-up Appointment: Yes Clinical Summary of Care: Electronic Signature(s) Signed: 09/26/2019  3:53:36 PM By: Bradley [name] Entered By: Bradley [name] on 09/26/2019 11:04:51 Bradley [name] ([id number]) -------------------------------------------------------------------------------- Lower Extremity Assessment Details Patient Name: Bradley Loa A. Date of Service: 09/26/2019 10:30 AM Medical Record Number: [id number] Patient Account Number: [id number] Date of Birth/Sex: 07/21/1945 (74 y.o. M) Treating RN: Bradley [name] Primary Care Cheron Pasquarelli: Bradley [name] Other Clinician: Referring Shamere Campas: Bradley [name] Treating Hannia Matchett/Extender: Bradley [name] in Treatment: 10 Electronic Signature(s) Signed: 09/26/2019 5:10:21 PM By: Bradley [name] Entered By: Bradley [name], BSN, RN, CWS, Bradley [name] on 09/26/2019 10:42:38 Bradley [name] ([id number]) -------------------------------------------------------------------------------- Multi Wound Chart Details Patient Name: Bradley [name], Bradley A. Date of Service: 09/26/2019 10:30 AM Medical Record Number: [id number] Patient Account Number: [id number] Date of Birth/Sex: 02-Mar-1946 (74 y.o. M) Treating RN: Bradley [name] Primary Care Anmarie Fukushima: Bradley [name] Other Clinician: Referring Nil Bolser: Bradley [name] Treating Delmas Faucett/Extender: Bradley [name] in Treatment: 10 Vital Signs Height(in): Pulse(bpm): 98 Weight(lbs): Blood Pressure(mmHg): 143/73 [name] Mass Index(BMI): Temperature(F): 98.3 Respiratory Rate(breaths/min): 16 Photos: [N/A:N/A] Wound Location: Midline Sacrum N/A N/A Wounding Event: Pressure Injury N/A N/A Primary Etiology: Pressure Ulcer N/A N/A Comorbid History: Anemia, Hypertension, History of N/A N/A pressure wounds, Rheumatoid Arthritis, Paraplegia Date Acquired: 06/07/2015 N/A N/A Weeks of Treatment: 10 N/A N/A Wound Status: Open N/A N/A Measurements L x W x D (cm) 13.2x6.8x5.7 N/A N/A Area (cm) : 70.497 N/A N/A Volume (cm) : 401.835 N/A N/A % Reduction in Area: -36.80% N/A N/A % Reduction  in Volume: -30.00% N/A N/A Classification: Category/Stage IV N/A N/A Exudate Amount: Medium N/A N/A Exudate Type: Serosanguineous N/A N/A Exudate Color: red, brown N/A N/A Wound Margin: Epibole N/A N/A Granulation Amount: Large (67-100%) N/A N/A Granulation Quality: Red N/A N/A Necrotic Amount: Small (1-33%) N/A N/A Exposed Structures: Fat Layer (Subcutaneous Tissue) N/A N/A Exposed: Yes Fascia: No Tendon: No Muscle: No Joint: No Bone: No Epithelialization: None N/A N/A Treatment Notes Wound #10 (Midline Sacrum) Notes collagen, wet to dry  gauze, xtrasorb, abd and tape Bradley [name], Bradley [name] ([id number]) Electronic Signature(s) Signed: 09/26/2019 4:21:14 PM By: Linton Ham MD Entered By: Linton Ham on 09/26/2019 11:30:25 Bradley [name] ([id number]) -------------------------------------------------------------------------------- [place] Details Patient Name: DARSHAN, BRICKEY A. Date of Service: 09/26/2019 10:30 AM Medical Record Number: [id number] Patient Account Number: [id number] Date of Birth/Sex: 1945-10-29 (74 y.o. M) Treating RN: Bradley [name] Primary Care Burnie Therien: Bradley [name] Other Clinician: Referring Carmelita Amparo: Bradley [name] Treating Eliam Snapp/Extender: Bradley [name] in Treatment: 10 Active Inactive Abuse / Safety / Falls / Self Care Management Nursing Diagnoses: Impaired physical mobility Goals: Patient will not develop complications from immobility Date Initiated: 07/16/2019 Target Resolution Date: 10/12/2019 Goal Status: Active Interventions: Assess Activities of Daily Living upon admission and as needed Notes: Orientation to the Wound Care Program Nursing Diagnoses: Knowledge deficit related to the wound healing center program Goals: Patient/caregiver will verbalize understanding of the [place] Program Date Initiated: 07/16/2019 Target Resolution Date: 10/12/2019 Goal Status: Active Interventions: Provide  education on orientation to the wound center Notes: Pressure Nursing Diagnoses: Knowledge deficit related to causes and risk factors for pressure ulcer development Goals: Patient will remain free from development of additional pressure ulcers Date Initiated: 07/16/2019 Target Resolution Date: 10/12/2019 Goal Status: Active Interventions: Provide education on pressure ulcers Notes: Wound/Skin Impairment Nursing Diagnoses: Impaired tissue integrity Bradley [name], Bradley [name] ([id number]) Goals: Ulcer/skin breakdown will heal within 14 weeks Date Initiated: 07/16/2019 Target Resolution Date: 10/12/2019 Goal Status: Active Interventions: Assess patient/caregiver ability to obtain necessary supplies Assess patient/caregiver ability to perform ulcer/skin care regimen upon admission and as needed Assess ulceration(s) every visit Notes: Electronic Signature(s) Signed: 09/26/2019 3:53:36 PM By: Bradley [name] Entered By: Bradley [name] on 09/26/2019 11:00:25 Bradley [name] ([id number]) -------------------------------------------------------------------------------- Pain Assessment Details Patient Name: Bradley Loa A. Date of Service: 09/26/2019 10:30 AM Medical Record Number: [id number] Patient Account Number: [id number] Date of Birth/Sex: 07-22-1945 (74 y.o. M) Treating RN: Bradley [name] Primary Care Nashira Mcglynn: Bradley [name] Other Clinician: Referring Adynn Caseres: Bradley [name] Treating Rosco Harriott/Extender: Bradley [name] in Treatment: 10 Active Problems Location of Pain Severity and Description of Pain Patient Has Paino No Site Locations Pain Management and Medication Current Pain Management: Electronic Signature(s) Signed: 09/26/2019 5:10:21 PM By: Bradley [name] Entered By: Bradley [name], BSN, RN, CWS, Bradley [name] on 09/26/2019 10:32:34 Bradley [name] ([id number]) -------------------------------------------------------------------------------- Patient/Caregiver Education  Details Patient Name: Bradley Loa A. Date of Service: 09/26/2019 10:30 AM Medical Record Number: [id number] Patient Account Number: [id number] Date of Birth/Gender: 01/18/1946 (74 y.o. M) Treating RN: Bradley [name] Primary Care Physician: Bradley [name] Other Clinician: Referring Physician: Dion [name] Treating Physician/Extender: Bradley [name] in Treatment: 10 Education Assessment Education Provided To: Patient Education Topics Provided Wound/Skin Impairment: Handouts: Caring for Your Ulcer Methods: Demonstration, Explain/Verbal Responses: State content correctly Electronic Signature(s) Signed: 09/26/2019 3:53:36 PM By: Bradley [name] Entered By: Bradley [name] on 09/26/2019 11:04:07 Bradley [name] ([id number]) -------------------------------------------------------------------------------- Wound Assessment Details Patient Name: Bradley Loa A. Date of Service: 09/26/2019 10:30 AM Medical Record Number: [id number] Patient Account Number: [id number] Date of Birth/Sex: 05/07/1946 (74 y.o. M) Treating RN: Bradley [name] Primary Care Kilo Eshelman: Bradley [name] Other Clinician: Referring Jayelle Page: Bradley [name] Treating Corday Wyka/Extender: Bradley [name] in Treatment: 10 Wound Status Wound Number: 10 Primary Pressure Ulcer Etiology: Wound Location: Midline Sacrum Wound Status: Open Wounding Event: Pressure Injury Comorbid Anemia, Hypertension, History of pressure wounds, Date Acquired: 06/07/2015 History: Rheumatoid Arthritis, Paraplegia Weeks Of Treatment: 10 Clustered Wound: No Photos Wound Measurements Length: (  cm) 13.2 Width: (cm) 6.8 Depth: (cm) 5.7 Area: (cm) 70.497 Volume: (cm) 401.835 % Reduction in Area: -36.8% % Reduction in Volume: -30% Epithelialization: None Tunneling: No Undermining: No Wound Description Classification: Category/Stage IV Wound Margin: Epibole Exudate Amount: Medium Exudate Type: Serosanguineous Exudate  Color: red, brown Foul Odor After Cleansing: No Slough/Fibrino Yes Wound Bed Granulation Amount: Large (67-100%) Exposed Structure Granulation Quality: Red Fascia Exposed: No Necrotic Amount: Small (1-33%) Fat Layer (Subcutaneous Tissue) Exposed: Yes Necrotic Quality: Adherent Slough Tendon Exposed: No Muscle Exposed: No Joint Exposed: No Bone Exposed: No Treatment Notes Wound #10 (Midline Sacrum) Notes collagen, wet to dry gauze, xtrasorb, abd and tape Electronic Signature(s) Signed: 09/26/2019 5:10:21 PM By: Bradley [name] Brum, [place] ([id number]) Entered By: Bradley [name], BSN, RN, CWS, Bradley [name] on 09/26/2019 10:42:22 Bradley [name] ([id number]) -------------------------------------------------------------------------------- Vitals Details Patient Name: Bradley Loa A. Date of Service: 09/26/2019 10:30 AM Medical Record Number: [id number] Patient Account Number: [id number] Date of Birth/Sex: 1946/01/23 (74 y.o. M) Treating RN: Bradley [name] Primary Care Kynlie Jane: Bradley [name] Other Clinician: Referring Ridhi Hoffert: Bradley [name] Treating Levaeh Vice/Extender: Bradley [name] in Treatment: 10 Vital Signs Time Taken: 10:31 Temperature (F): 98.3 Pulse (bpm): 74 Respiratory Rate (breaths/min): 16 Blood Pressure (mmHg): 143/73 Reference Range: 80 - 120 mg / dl Electronic Signature(s) Signed: 09/26/2019 5:10:21 PM By: Bradley [name] Entered By: Bradley [name], BSN, RN, CWS, Bradley [name] on 09/26/2019 10:32:28

## 2019-09-26 NOTE — Progress Notes (Signed)
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 surface. The 2 areas of exposed bone in the center of this wound had closed over. He has an appointment with plastic surgery on December 5 at which time I hope that there'll be a plan for myocutaneous flap closure In looking through [facility] I couldn't find any more plastic surgery appointments. I did come across the fact that he is been followed by hematology for a microcytic hypochromic anemia. He had a reasonably normal looking hemoglobin electrophoresis. His iron level was 10  and according to the patient he is going for IV iron infusions starting tomorrow. He had a sedimentation rate of 74. More problematically from a pure wound care point of view his albumin was 2.7 earlier this month 05/17/17; this is a patient I follow monthly. He has a large now stage IV wound over his bilateral buttocks with close proximity to his anal opening. More recently he has developed a large area with exposed bone in the center of this probably secondary to the underlying osteomyelitis E had in the summer. He also follows with Dr. Myriam Jacobson at [facility] who is plastic surgery. He had an appointment earlier this month and according to the patient Dr. Myriam Jacobson does not want to proceed with any attempted flap closure. Although  I do not have current access to her note in care everywhere this is likely due to exposed bone. Again according to the patient they did a bone biopsy. He is still using a variant of Dakin solution changing twice a day. He has home health. The patient is not able to give me a firm answer about how long he spends on this in his wheelchair The patient also states that Dr. Myriam Jacobson wanted to reconsider a wound VAC. I really don't see this as a viable option at least not in the outpatient setting. The wound itself is frankly to close to his anal opening to maintain a seal. The last time we tried to do this home health was unable to manage it. It might be possible to maintain a wound VAC in this setting outside of the home such as a skilled nursing facility or an [place] however I am doubtful about this even in that setting **** READMISSION 09/21/17-He is here for evaluation of stage IV sacral ulcer. Since his last evaluation here in December he has completed treatment for sacral osteomyelitis. He was at [facility] for IV therapy and NPWT dressing changes. He was discharged, with home health services, in February. He admits that while in the skilled facility he had "80%" success with  maintaining dressing, since discharge he has had approximately "40%" success with maintaining wound VAC dressing. We discussed at length that this is not a safe or recommended option. We will apply Dakin's wet to dry dressing daily and he will follow-up next week. He is accompanied today by his sister who is willing to assist in dressing changes; they will discuss the social issue as he feels he is capable of changing dressing daily when home health is not able. 09/28/17-He is here in follow-up evaluation for stage IV sacral pressure ulcer. He has been using the Dakin's wet-to-dry daily; he continues with home health. He is not accompanied by anyone at this visit. He will follow up in two weeks per his request/preference. 10/12/17 on evaluation today patient appears to be doing very well. The Dakin solution went to dry packings do seem to be helping him as far as the sacral wound is concerned I'm not seeing anything that has me more concerned as far as infection or otherwise is concerned. Overall I'm pleased with the appearance of the wound. 10/26/17-He is here in follow up evaluation for a stage IV sacral ulcer. He continues with daily Dakin's wet-to-dry. He is voicing no complaints or concerns. He will follow-up in 2 weeks 11/16/17-He is here for follow up evaluation for a palliative stage IV sacral pressure ulcer. We will continue with Dakin's wet-to-dry. He will follow-up in 4 weeks. He is expressing concern/complaint regarding new bed that has arrived, stating he is unable to manipulate/maneuver it due to the bed crank being at the foot of the bed. 12/14/17-He is here for evaluation for palliative stage IV sacral ulcer. He is voicing no complaints or concerns. We will continue with one-to-one ratio of saline and dakins. He will follow-up in 3 weeks 01/04/18-He is here in follow-up evaluation for palliative stage IV sacral ulcer. He is inquiring about reinstating the negative pressure wound therapy.  We discussed at length that the negative pressure wound therapy in the home setting has not been successful for him repeatedly with loss of cereal and unavailability of 24/7 help; reminded him that home health is not available 24/7 when loss of seal occurs. He does verbalize understanding to this and  does not pursue. We also discussed the palliative nature of this ulcer (given no significant change/improvement in measurement/appearance, not a candidate for muscle flap per plastic surgery, and continued independent living) and that the goal is for maintenance, decrease in infection and minimizing/avoiding deterioration given that he is independent in his care, does not have home health and requires daily dressing changes secondary to drainage amount. He is inquiring about a wound clinic in [facility], I have informed him that I am unfamiliar with that clinic but that he is encouraged to seek another opinion if that is his desire. We will continue with dakins and he will follow up in three weeks 01/25/18-He is here in follow-up evaluation for palliative stage IV sacral ulcer. He continues with Dakin's/saline 1:1 mixture wet to dry dressing changes. He states he has an appointment at [facility] on 9/17 for evaluation of surgical intervention/closure of the sacral ulcer. He will follow-up here in 4 weeks Readmission: 07/16/2019 upon evaluation today patient appears to be doing really about the same as when he was previously seen here in the wound care center. He most recently was a patient of [place] back when she was still working here in the center but had been referred to [facility] for consideration of a flap. With that being said the surgeon there at [facility] stated that this was too large for her flap and they have been attempting to get this smaller in order to be able to proceed with a flap. Nonetheless unfortunately he has had a cycle of going back and forth between the osteomyelitis flaring and then sent him back and then  making a little bit of progress only to be sent back again. It sounds like most recently they have been using a Iodoflex type dressing at this point which does not seem to have done any harm by any means. With that being said this wound seems to be quite large for using Iodoflex throughout and subsequently I think he may do much better with the use of Vioxx moistened gauze which would be safe for the new tissue growing and also keep the wound quite nice and clean. The patient is not opposed to this he in fact states that his home health nurse had mentioned this as a possibility as well. 07/23/2019 upon evaluation today patient actually appears to be doing very well in regard to his sacral wound. In fact this is much healthier the measurements not terribly different but again with a wound like this at home necessarily expect a a lot of change as far as the overall measurements are concerned in just 1 week's time. This is going be a much longer term process at this point. With that being said I do think that he is very healthy Sapia, Isaah A. ([id number]) appearing as far as the base of the wound is concerned. 08/06/2019 upon evaluation today patient actually appears to be doing about the same with regard to his wound to be honest. There is really not a significant improvement overall based on what I am seeing today. Fortunately there is no signs of significant systemic infection. No fevers, chills, nausea, vomiting, or diarrhea. With that being said there is odor to the drainage from the wound and subsequently also what appears to be increased drainage based on what we are seeing today as well as what his home health nurse called [place] about that she was concerned with as well. 08/13/2019 upon evaluation today patient appears to be doing about the same at  this point with regard to his wound although I think the dressing may be a little less drainage wise compared to what it was previous. Fortunately there is  no signs of active infection at this time. No fevers, chills, nausea, vomiting, or diarrhea. He has been taking the antibiotic for only 3 days. 08/23/2019 upon evaluation today patient appears to be doing not nearly as well with regard to his wound. In general really not making a lot of progress which is unfortunate. There does not appear to be any signs of active infection at this time. No fevers, chills, nausea, vomiting, or diarrhea. With that being said the patient unfortunately does seem to be experiencing continued epiboly around the edges of the wound as well as significant scar tissue he has been in the majority of his day sitting in his chair which is likely a big reason for all of this. Fortunately there is no signs of active infection at this time. No fevers, chills, nausea, vomiting, or diarrhea. 08/29/2019 upon evaluation today patient's wound actually appears to be showing some signs of improvement. The region of few areas of new skin growth around the edges of the wound even where he has some of the epiboly. This is actually good news and I think that he has been very aggressive and offloading over the past week since we showed him the pictures of his wound last week. There still may be a chance that he is going require some sharp debridement to clear away some of these edges but to be honest I think that is can be quite an undertaking. The main reason is that he has a lot of thickened scar tissue and it is good to bleed quite significantly in my opinion. The I think if organ to do that we may even need to have a portable or disposable cautery in order to make sure that we are able to get the area completely sealed up as far as bleeding is concerned. I do have one that we can utilize. However being that the wound looks so much better right now would like to give this 2 weeks to see how things stand and look at that point before making any additional recommendations. 09/12/2019 upon  evaluation today patient appears to be making some progress as far as offloading is concerned. He still has a substantial wound but nonetheless he tells me he is off loading much more aggressively at this point. This is obviously good news. No fevers, chills, nausea, vomiting, or diarrhea. 4/22; I have not seen this patient in quite some time. No major change in the condition of the wound or its wound volume. Surrounding maceration of the skin around the edges of the wound. The wound is fairly substantial. To close to the anal opening to consider a wound VAC. He had extensive trials of plastic surgery at [facility] which really does not result in any improvement. His wound bed is however clean Objective Constitutional Patient is hypertensive.. Pulse regular and within target range for patient.Marland Kitchen Respirations regular, non-labored and within target range.. Temperature is normal and within the target range for the patient.Marland Kitchen appears in no distress. Vitals Time Taken: 10:31 AM, Temperature: 98.3 F, Pulse: 74 bpm, Respiratory Rate: 16 breaths/min, Blood Pressure: 143/73 mmHg. General Notes: Wound exam; the patient's wound bed looks healthy. There is depth in the middle of this but the rest of this looks like healthy tissue. There is macerated scar tissue around the wound edge but this  is chronic. There is no evidence of surrounding infection. Soft tissue around the wound is healthy looking. Integumentary (Hair, Skin) Wound #10 status is Open. Original cause of wound was Pressure Injury. The wound is located on the Midline Sacrum. The wound measures 13.2cm length x 6.8cm width x 5.7cm depth; 70.497cm^2 area and 401.835cm^3 volume. There is Fat Layer (Subcutaneous Tissue) Exposed exposed. There is no tunneling or undermining noted. There is a medium amount of serosanguineous drainage noted. The wound margin is epibole. There is large (67- 100%) red granulation within the wound bed. There is a small  (1-33%) amount of necrotic tissue within the wound bed including Adherent Slough. Assessment Active Problems ICD-10 Pressure ulcer of sacral region, stage 4 Stradling, Clavin A. ([id number]) Other chronic osteomyelitis, other site Paraplegia, unspecified Plan Wound Cleansing: Wound #10 Midline Sacrum: Clean wound with Normal Saline. Dial antibacterial soap, wash wounds, rinse and pat dry prior to dressing wounds Primary Wound Dressing: Wound #10 Midline Sacrum: Collagen - in center base of wound moistened with saline Plain packing gauze - wet to dry to fill in space Secondary Dressing: Wound #10 Midline Sacrum: ABD pad XtraSorb - secure with tape Dressing Change Frequency: Wound #10 Midline Sacrum: Change dressing every day. - Patient's son to change when [facility] does not visit Follow-up Appointments: Wound #10 Midline Sacrum: Return Appointment in 2 weeks. Off-Loading: Wound #10 Midline Sacrum: Gel wheelchair cushion Turn and reposition every 2 hours Home Health: Wound #10 Midline Sacrum: [place] Nurse may visit PRN to address patient s wound care needs. FACE TO FACE ENCOUNTER: MEDICARE and MEDICAID PATIENTS: I certify that this patient is under my care and that I had a face-to-face encounter that meets the physician face-to-face encounter requirements with this patient on this date. The encounter with the patient was in whole or in part for the following MEDICAL CONDITION: (primary reason for [place]) MEDICAL NECESSITY: I certify, that based on my findings, NURSING services are a medically necessary home health service. HOME BOUND STATUS: I certify that my clinical findings support that this patient is homebound (i.e., Due to illness or injury, pt requires aid of supportive devices such as crutches, cane, wheelchairs, walkers, the use of special transportation or the assistance of another person to leave their place of residence. There is a  normal inability to leave the home and doing so requires considerable and taxing effort. Other absences are for medical reasons / religious services and are infrequent or of short duration when for other reasons). If current dressing causes regression in wound condition, may D/C ordered dressing product/s and apply Normal Saline Moist Dressing daily until next [place] / Other MD appointment. [place] of regression in wound condition at [phone]. Please direct any NON-WOUND related issues/requests for orders to patient's Primary Care Physician 1. I change the primary dressing to collagen hydrogel to the central part of the wound with the backing wet-to-dry. He has [facility] changing the wound dressings. 2. The patient's already failed plastic surgery already several years ago. He did have a wound VAC for a period of time but home health could not maintain the wound VAC in the position 3. The patient talks about offloading this area but I think in the past that is what I thought the major issue was. 4. If we can get the deeper parts of this wound to fill-in then I think we could attempt an aggressive debridement of the edges of the wound but  right now I do not think that would make the difference 5. There is no evidence of current infection Electronic Signature(s) Signed: 09/26/2019 4:21:14 PM By: Linton Ham MD Entered By: Linton Ham on 09/26/2019 12:01:03 Bradley [name] ([id number]) -------------------------------------------------------------------------------- [place] Details Patient Name: Bradley Loa A. Date of Service: 09/26/2019 Medical Record Number: [id number] Patient Account Number: [id number] Date of Birth/Sex: 03/26/1946 (74 y.o. M) Treating RN: Army Melia Primary Care Provider: [name] Other Clinician: Referring Provider: [name] Treating Provider/Extender: Tito Dine in Treatment: 10 Diagnosis  Coding ICD-10 Codes Code Description L89.154 Pressure ulcer of sacral region, stage 4 M86.68 Other chronic osteomyelitis, other site G82.20 Paraplegia, unspecified Facility Procedures CPT4 Code: [id number] Description: 99213 - WOUND CARE VISIT-LEV 3 EST PT Modifier: Quantity: 1 Physician Procedures CPT4 Code: [id number] Description: [id number] - WC PHYS LEVEL 3 - EST PT Modifier: Quantity: 1 CPT4 Code: Description: ICD-10 Diagnosis Description L89.154 Pressure ulcer of sacral region, stage 4 G82.20 Paraplegia, unspecified Modifier: Quantity: Electronic Signature(s) Signed: 09/26/2019 4:21:14 PM By: Linton Ham MD Entered By: Linton Ham on 09/26/2019 12:01:25

## 2019-09-27 DIAGNOSIS — G8220 Paraplegia, unspecified: Secondary | ICD-10-CM | POA: Diagnosis not present

## 2019-09-27 DIAGNOSIS — J309 Allergic rhinitis, unspecified: Secondary | ICD-10-CM | POA: Diagnosis not present

## 2019-09-27 DIAGNOSIS — S24104S Unspecified injury at T11-T12 level of thoracic spinal cord, sequela: Secondary | ICD-10-CM | POA: Diagnosis not present

## 2019-09-27 DIAGNOSIS — N319 Neuromuscular dysfunction of bladder, unspecified: Secondary | ICD-10-CM | POA: Diagnosis not present

## 2019-09-27 DIAGNOSIS — I10 Essential (primary) hypertension: Secondary | ICD-10-CM | POA: Diagnosis not present

## 2019-09-27 DIAGNOSIS — N400 Enlarged prostate without lower urinary tract symptoms: Secondary | ICD-10-CM | POA: Diagnosis not present

## 2019-09-27 DIAGNOSIS — D509 Iron deficiency anemia, unspecified: Secondary | ICD-10-CM | POA: Diagnosis not present

## 2019-09-27 DIAGNOSIS — L89154 Pressure ulcer of sacral region, stage 4: Secondary | ICD-10-CM | POA: Diagnosis not present

## 2019-09-27 DIAGNOSIS — Z933 Colostomy status: Secondary | ICD-10-CM | POA: Diagnosis not present

## 2019-10-01 DIAGNOSIS — Z87891 Personal history of nicotine dependence: Secondary | ICD-10-CM | POA: Diagnosis not present

## 2019-10-01 DIAGNOSIS — Z1331 Encounter for screening for depression: Secondary | ICD-10-CM | POA: Diagnosis not present

## 2019-10-01 DIAGNOSIS — M869 Osteomyelitis, unspecified: Secondary | ICD-10-CM | POA: Diagnosis not present

## 2019-10-01 DIAGNOSIS — I10 Essential (primary) hypertension: Secondary | ICD-10-CM | POA: Diagnosis not present

## 2019-10-01 DIAGNOSIS — K458 Other specified abdominal hernia without obstruction or gangrene: Secondary | ICD-10-CM | POA: Diagnosis not present

## 2019-10-01 DIAGNOSIS — G8220 Paraplegia, unspecified: Secondary | ICD-10-CM | POA: Diagnosis not present

## 2019-10-01 DIAGNOSIS — L89154 Pressure ulcer of sacral region, stage 4: Secondary | ICD-10-CM | POA: Diagnosis not present

## 2019-10-01 DIAGNOSIS — Z0000 Encounter for general adult medical examination without abnormal findings: Secondary | ICD-10-CM | POA: Diagnosis not present

## 2019-10-01 DIAGNOSIS — D509 Iron deficiency anemia, unspecified: Secondary | ICD-10-CM | POA: Diagnosis not present

## 2019-10-02 DIAGNOSIS — D509 Iron deficiency anemia, unspecified: Secondary | ICD-10-CM | POA: Diagnosis not present

## 2019-10-02 DIAGNOSIS — N400 Enlarged prostate without lower urinary tract symptoms: Secondary | ICD-10-CM | POA: Diagnosis not present

## 2019-10-02 DIAGNOSIS — I10 Essential (primary) hypertension: Secondary | ICD-10-CM | POA: Diagnosis not present

## 2019-10-02 DIAGNOSIS — N319 Neuromuscular dysfunction of bladder, unspecified: Secondary | ICD-10-CM | POA: Diagnosis not present

## 2019-10-02 DIAGNOSIS — G8220 Paraplegia, unspecified: Secondary | ICD-10-CM | POA: Diagnosis not present

## 2019-10-02 DIAGNOSIS — S24104S Unspecified injury at T11-T12 level of thoracic spinal cord, sequela: Secondary | ICD-10-CM | POA: Diagnosis not present

## 2019-10-02 DIAGNOSIS — J309 Allergic rhinitis, unspecified: Secondary | ICD-10-CM | POA: Diagnosis not present

## 2019-10-02 DIAGNOSIS — Z933 Colostomy status: Secondary | ICD-10-CM | POA: Diagnosis not present

## 2019-10-02 DIAGNOSIS — L89154 Pressure ulcer of sacral region, stage 4: Secondary | ICD-10-CM | POA: Diagnosis not present

## 2019-10-04 DIAGNOSIS — G8220 Paraplegia, unspecified: Secondary | ICD-10-CM | POA: Diagnosis not present

## 2019-10-04 DIAGNOSIS — L89154 Pressure ulcer of sacral region, stage 4: Secondary | ICD-10-CM | POA: Diagnosis not present

## 2019-10-04 DIAGNOSIS — D509 Iron deficiency anemia, unspecified: Secondary | ICD-10-CM | POA: Diagnosis not present

## 2019-10-04 DIAGNOSIS — Z933 Colostomy status: Secondary | ICD-10-CM | POA: Diagnosis not present

## 2019-10-04 DIAGNOSIS — I10 Essential (primary) hypertension: Secondary | ICD-10-CM | POA: Diagnosis not present

## 2019-10-04 DIAGNOSIS — N319 Neuromuscular dysfunction of bladder, unspecified: Secondary | ICD-10-CM | POA: Diagnosis not present

## 2019-10-04 DIAGNOSIS — J309 Allergic rhinitis, unspecified: Secondary | ICD-10-CM | POA: Diagnosis not present

## 2019-10-04 DIAGNOSIS — S24104S Unspecified injury at T11-T12 level of thoracic spinal cord, sequela: Secondary | ICD-10-CM | POA: Diagnosis not present

## 2019-10-04 DIAGNOSIS — N400 Enlarged prostate without lower urinary tract symptoms: Secondary | ICD-10-CM | POA: Diagnosis not present

## 2019-10-07 DIAGNOSIS — J309 Allergic rhinitis, unspecified: Secondary | ICD-10-CM | POA: Diagnosis not present

## 2019-10-07 DIAGNOSIS — Z933 Colostomy status: Secondary | ICD-10-CM | POA: Diagnosis not present

## 2019-10-07 DIAGNOSIS — G8220 Paraplegia, unspecified: Secondary | ICD-10-CM | POA: Diagnosis not present

## 2019-10-07 DIAGNOSIS — L89154 Pressure ulcer of sacral region, stage 4: Secondary | ICD-10-CM | POA: Diagnosis not present

## 2019-10-07 DIAGNOSIS — D509 Iron deficiency anemia, unspecified: Secondary | ICD-10-CM | POA: Diagnosis not present

## 2019-10-07 DIAGNOSIS — N400 Enlarged prostate without lower urinary tract symptoms: Secondary | ICD-10-CM | POA: Diagnosis not present

## 2019-10-07 DIAGNOSIS — N319 Neuromuscular dysfunction of bladder, unspecified: Secondary | ICD-10-CM | POA: Diagnosis not present

## 2019-10-07 DIAGNOSIS — I10 Essential (primary) hypertension: Secondary | ICD-10-CM | POA: Diagnosis not present

## 2019-10-07 DIAGNOSIS — S24104S Unspecified injury at T11-T12 level of thoracic spinal cord, sequela: Secondary | ICD-10-CM | POA: Diagnosis not present

## 2019-10-08 DIAGNOSIS — K435 Parastomal hernia without obstruction or  gangrene: Secondary | ICD-10-CM | POA: Diagnosis not present

## 2019-10-09 DIAGNOSIS — Z933 Colostomy status: Secondary | ICD-10-CM | POA: Diagnosis not present

## 2019-10-09 DIAGNOSIS — I10 Essential (primary) hypertension: Secondary | ICD-10-CM | POA: Diagnosis not present

## 2019-10-09 DIAGNOSIS — J309 Allergic rhinitis, unspecified: Secondary | ICD-10-CM | POA: Diagnosis not present

## 2019-10-09 DIAGNOSIS — N319 Neuromuscular dysfunction of bladder, unspecified: Secondary | ICD-10-CM | POA: Diagnosis not present

## 2019-10-09 DIAGNOSIS — S24104S Unspecified injury at T11-T12 level of thoracic spinal cord, sequela: Secondary | ICD-10-CM | POA: Diagnosis not present

## 2019-10-09 DIAGNOSIS — G8220 Paraplegia, unspecified: Secondary | ICD-10-CM | POA: Diagnosis not present

## 2019-10-09 DIAGNOSIS — D509 Iron deficiency anemia, unspecified: Secondary | ICD-10-CM | POA: Diagnosis not present

## 2019-10-09 DIAGNOSIS — L89154 Pressure ulcer of sacral region, stage 4: Secondary | ICD-10-CM | POA: Diagnosis not present

## 2019-10-09 DIAGNOSIS — N400 Enlarged prostate without lower urinary tract symptoms: Secondary | ICD-10-CM | POA: Diagnosis not present

## 2019-10-10 ENCOUNTER — Encounter: Payer: Medicare HMO | Attending: Physician Assistant | Admitting: Physician Assistant

## 2019-10-10 ENCOUNTER — Other Ambulatory Visit: Payer: Self-pay

## 2019-10-10 DIAGNOSIS — G8220 Paraplegia, unspecified: Secondary | ICD-10-CM | POA: Diagnosis not present

## 2019-10-10 DIAGNOSIS — L89154 Pressure ulcer of sacral region, stage 4: Secondary | ICD-10-CM | POA: Diagnosis not present

## 2019-10-10 DIAGNOSIS — M8668 Other chronic osteomyelitis, other site: Secondary | ICD-10-CM | POA: Insufficient documentation

## 2019-10-10 NOTE — Progress Notes (Addendum)
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 colostomy placed. On 11/29/16. This was after he was admitted to [facility] sometime in May. An MRI of the pelvis on 5/23 showed osteomyelitis of the coccyx. An attempt was made to drain fluid that was not successful. He was treated with empiric broad-spectrum antibiotics VAC/cefepime/Flagyl starting on 11/02/16 with plans for a 6 week course. According to their notes he was sent to a nursing home. Was last seen by Dr. Myriam Jacobson of plastic surgery on 12/28/16. The first part  of the note is a long dissertation about the difficulties finding adequate patients for flap closure of pressure ulcers. At that time the wound was noted to be stage IV based I think on underlying infection no exposed bone and healthy granulation tissue. 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 He was last seen by infectious disease 01/12/17 A Dr. Uvaldo Rising. His note says that [name] was not interested in a flap closure for referring a trial of the wound VAC. As previously anticipated the wound VAC could not be maintained as an outpatient in the community. He is now using something similar to a Dakin's wet to dry recommended by [facility] VASHE solution. He is placing this twice a day himself. This is almost s hopeless setting in terms of heeling 02/28/17; he is using a Dakin's wet to dry. Most of the wound surface looks satisfactory however the deeper area over his coccyx now  has exposed bone I'm not sure if I noted this last week. 03/21/17; patient is usingVASHE solution wet to dry which I gather is a variation on Dakin solution. He has home health changing this 3 times a week the other days he does this himself. His appointment with plastic surgery 04/18/17; patient continues to use a variant of Dakin solution I believe. His wound continues to have a clean viable surface. The 2 areas of exposed bone in the center of this wound had closed over. He has an appointment with plastic surgery on December 5 at which time I hope that there'll be a plan for myocutaneous flap closure In looking through [facility] link I couldn't find any more plastic surgery appointments. I did come across the fact that he is been followed by Bradley [name] ([id number]) hematology for a microcytic hypochromic anemia. He had a reasonably normal looking hemoglobin electrophoresis. His iron level was 10 and according to the patient he is going for IV iron infusions starting tomorrow. He had  a sedimentation rate of 74. More problematically from a pure wound care point of view his albumin was 2.7 earlier this month 05/17/17; this is a patient I follow monthly. He has a large now stage IV wound over his bilateral buttocks with close proximity to his anal opening. More recently he has developed a large area with exposed bone in the center of this probably secondary to the underlying osteomyelitis E had in the summer. He also follows with Dr. Myriam Jacobson at [facility] who is plastic surgery. He had an appointment earlier this month and according to the patient Dr. Myriam Jacobson does not want to proceed with any attempted flap closure. Although I do not have current access to her note in care everywhere this is likely due to exposed bone. Again according to the patient they did a bone biopsy. He is still using a variant of Dakin solution changing twice a day. He has home health. The patient is not able to give me a firm answer about how long he spends on this in his wheelchair The patient also states that Dr. Myriam Jacobson wanted to reconsider a wound VAC. I really don't see this as a viable option at least not in the outpatient setting. The wound itself is frankly to close to his anal opening to maintain a seal. The last time we tried to do this home health was unable to manage it. It might be possible to maintain a wound VAC in this setting outside of the home such as a skilled nursing facility or an [facility] however I am doubtful about this even in that setting **** READMISSION 09/21/17-He is here for evaluation of stage IV sacral ulcer. Since his last evaluation here in December he has completed treatment for sacral osteomyelitis. He was at [facility] for IV therapy and NPWT dressing changes. He was discharged, with home health services, in February. He admits that while in the skilled facility he had "80%" success with maintaining dressing, since discharge he has had approximately "40%" success with  maintaining wound VAC dressing. We discussed at length that this is not a safe or recommended option. We will apply Dakin's wet to dry dressing daily and he will follow-up next week. He is accompanied today by his sister who is willing to assist in dressing changes; they will discuss the social issue as he feels he is capable of changing dressing daily when home health is not able. 09/28/17-He is here in follow-up evaluation for stage  IV sacral pressure ulcer. He has been using the Dakin's wet-to-dry daily; he continues with home health. He is not accompanied by anyone at this visit. He will follow up in two weeks per his request/preference. 10/12/17 on evaluation today patient appears to be doing very well. The Dakin solution went to dry packings do seem to be helping him as far as the sacral wound is concerned I'm not seeing anything that has me more concerned as far as infection or otherwise is concerned. Overall I'm pleased with the appearance of the wound. 10/26/17-He is here in follow up evaluation for a stage IV sacral ulcer. He continues with daily Dakin's wet-to-dry. He is voicing no complaints or concerns. He will follow-up in 2 weeks 11/16/17-He is here for follow up evaluation for a palliative stage IV sacral pressure ulcer. We will continue with Dakin's wet-to-dry. He will follow- up in 4 weeks. He is expressing concern/complaint regarding new bed that has arrived, stating he is unable to manipulate/maneuver it due to the bed crank being at the foot of the bed. 12/14/17-He is here for evaluation for palliative stage IV sacral ulcer. He is voicing no complaints or concerns. We will continue with one-to-one ratio of saline and dakins. He will follow-up in 3 weeks 01/04/18-He is here in follow-up evaluation for palliative stage IV sacral ulcer. He is inquiring about reinstating the negative pressure wound therapy. We discussed at length that the negative pressure wound therapy in the home setting  has not been successful for him repeatedly with loss of cereal and unavailability of 24/7 help; reminded him that home health is not available 24/7 when loss of seal occurs. He does verbalize understanding to this and does not pursue. We also discussed the palliative nature of this ulcer (given no significant change/improvement in measurement/appearance, not a candidate for muscle flap per plastic surgery, and continued independent living) and that the goal is for maintenance, decrease in infection and minimizing/avoiding deterioration given that he is independent in his care, does not have home health and requires daily dressing changes secondary to drainage amount. He is inquiring about a wound clinic in [facility], I have informed him that I am unfamiliar with that clinic but that he is encouraged to seek another opinion if that is his desire. We will continue with dakins and he will follow up in three weeks 01/25/18-He is here in follow-up evaluation for palliative stage IV sacral ulcer. He continues with Dakin's/saline 1:1 mixture wet to dry dressing changes. He states he has an appointment at [facility] on 9/17 for evaluation of surgical intervention/closure of the sacral ulcer. He will follow-up here in 4 weeks Readmission: 07/16/2019 upon evaluation today patient appears to be doing really about the same as when he was previously seen here in the wound care center. He most recently was a patient of [place] back when she was still working here in the center but had been referred to [facility] for consideration of a flap. With that being said the surgeon there at [facility] stated that this was too large for her flap and they have been attempting to get this smaller in order to be able to proceed with a flap. Nonetheless unfortunately he has had a cycle of going back and forth between the osteomyelitis flaring and then sent him back and then making a little bit of progress only to be sent back again. It sounds like most  recently they have been using a Iodoflex type dressing at this point which does not  seem to have done any harm by any means. With that being said this wound seems to be quite large for using Iodoflex throughout and subsequently I think he may do much better with the use of Vioxx moistened gauze which would be safe for the new tissue growing and also keep the wound quite nice and clean. The patient is not opposed to this he in fact states that his home health nurse had mentioned this as a possibility as well. 07/23/2019 upon evaluation today patient actually appears to be doing very well in regard to his sacral wound. In fact this is much healthier the measurements not terribly different but again with a wound like this at home necessarily expect a a lot of change as far as the overall measurements are concerned in just 1 week's time. This is going be a much longer term process at this point. With that being said I do think that he is very healthy appearing as far as the base of the wound is concerned. 08/06/2019 upon evaluation today patient actually appears to be doing about the same with regard to his wound to be honest. There is really not a significant improvement overall based on what I am seeing today. Fortunately there is no signs of significant systemic infection. No fevers, chills, nausea, vomiting, or diarrhea. With that being said there is odor to the drainage from the wound and subsequently also what appears to be increased drainage based on what we are seeing today as well as what his home health nurse called [place] about that she was concerned with as well. 08/13/2019 upon evaluation today patient appears to be doing about the same at this point with regard to his wound although I think the dressing may be a little less drainage wise compared to what it was previous. Fortunately there is no signs of active infection at this time. No fevers, chills, nausea, vomiting, or diarrhea. He has been  taking the antibiotic for only 3 days. 08/23/2019 upon evaluation today patient appears to be doing not nearly as well with regard to his wound. In general really not making a lot of progress which is unfortunate. There does not appear to be any signs of active infection at this time. No fevers, chills, nausea, vomiting, or diarrhea. With that being said the patient unfortunately does seem to be experiencing continued epiboly around the edges of the wound as well as significant scar tissue he has been in the majority of his day sitting in his chair which is likely a big reason for all of this. Fortunately there is no Hughett, Bradley A. ([id number]) signs of active infection at this time. No fevers, chills, nausea, vomiting, or diarrhea. 08/29/2019 upon evaluation today patient's wound actually appears to be showing some signs of improvement. The region of few areas of new skin growth around the edges of the wound even where he has some of the epiboly. This is actually good news and I think that he has been very aggressive and offloading over the past week since we showed him the pictures of his wound last week. There still may be a chance that he is going require some sharp debridement to clear away some of these edges but to be honest I think that is can be quite an undertaking. The main reason is that he has a lot of thickened scar tissue and it is good to bleed quite significantly in my opinion. The I think if organ to do that we may  even need to have a portable or disposable cautery in order to make sure that we are able to get the area completely sealed up as far as bleeding is concerned. I do have one that we can utilize. However being that the wound looks so much better right now would like to give this 2 weeks to see how things stand and look at that point before making any additional recommendations. 09/12/2019 upon evaluation today patient appears to be making some progress as far as offloading is  concerned. He still has a substantial wound but nonetheless he tells me he is off loading much more aggressively at this point. This is obviously good news. No fevers, chills, nausea, vomiting, or diarrhea. 4/22; I have not seen this patient in quite some time. No major change in the condition of the wound or its wound volume. Surrounding maceration of the skin around the edges of the wound. The wound is fairly substantial. To close to the anal opening to consider a wound VAC. He had extensive trials of plastic surgery at [facility] which really does not result in any improvement. His wound bed is however clean 10/10/2019 upon evaluation today patient appears to be doing well with regard to the wound in the sacral region. He has been tolerating the dressing changes without complication. Fortunately there is no signs of infection and he actually has some epithelial growth noted as well. He tells me has been trying to continue to offload this area which is excellent that is what he needs to do most as far as what he can have in his control. Otherwise I feel like the collagen with a saline moistened gauze behind has been beneficial for him. Objective Constitutional Obese and well-hydrated in no acute distress. Vitals Time Taken: 10:36 AM, Temperature: 98.2 F, Pulse: 76 bpm, Respiratory Rate: 16 breaths/min, Blood Pressure: 118/59 mmHg. Respiratory normal breathing without difficulty. Psychiatric this patient is able to make decisions and demonstrates good insight into disease process. Alert and Oriented x 3. pleasant and cooperative. General Notes: Upon inspection patient's wound bed appears to be clean and there is really no need for sharp debridement he does have some epiboly around the edges but again as I discussed with him in the past there is really nothing much to do here unless he is going go see a surgeon for more aggressive debridement which at this point he really does not want to do  that. That reason I think the best option is good be for [place] to continue with appropriate treatment utilizing the collagen with the saline moistened gauze packed in behind. Integumentary (Hair, Skin) Wound #10 status is Open. Original cause of wound was Pressure Injury. The wound is located on the Midline Sacrum. The wound measures 12.5cm length x 7cm width x 4.4cm depth; 68.722cm^2 area and 302.378cm^3 volume. There is Fat Layer (Subcutaneous Tissue) Exposed exposed. There is no tunneling or undermining noted. There is a medium amount of serosanguineous drainage noted. Foul odor after cleansing was noted. The wound margin is epibole. There is large (67-100%) red granulation within the wound bed. There is a small (1-33%) amount of necrotic tissue within the wound bed including Adherent Slough. Assessment Active Problems ICD-10 Pressure ulcer of sacral region, stage 4 Other chronic osteomyelitis, other site Paraplegia, unspecified Bradley [name], Bradley A. ([id number]) Plan Wound Cleansing: Wound #10 Midline Sacrum: Clean wound with Normal Saline. Dial antibacterial soap, wash wounds, rinse and pat dry prior to dressing wounds Primary Wound Dressing: Wound #  10 Midline Sacrum: Collagen - in center base of wound moistened with saline Plain packing gauze - wet to dry to fill in space Secondary Dressing: Wound #10 Midline Sacrum: ABD pad XtraSorb - in office only Other - Carboflex 15cmx20cm Dressing Change Frequency: Wound #10 Midline Sacrum: Change dressing every day. - Patient's son to change when [facility] does not visit Other: - [place] physical therapist to evaluate patient for new Roho cushion Follow-up Appointments: Wound #10 Midline Sacrum: Return Appointment in 2 weeks. Off-Loading: Wound #10 Midline Sacrum: Gel wheelchair cushion - HH PT to evaluate pt for new Roho cushion Turn and reposition every 2 hours Home Health: Wound #10 Midline Sacrum: [place] Nurse  may visit PRN to address patient s wound care needs. FACE TO FACE ENCOUNTER: MEDICARE and MEDICAID PATIENTS: I certify that this patient is under my care and that I had a face-to-face encounter that meets the physician face-to-face encounter requirements with this patient on this date. The encounter with the patient was in whole or in part for the following MEDICAL CONDITION: (primary reason for [place]) MEDICAL NECESSITY: I certify, that based on my findings, NURSING services are a medically necessary home health service. HOME BOUND STATUS: I certify that my clinical findings support that this patient is homebound (i.e., Due to illness or injury, pt requires aid of supportive devices such as crutches, cane, wheelchairs, walkers, the use of special transportation or the assistance of another person to leave their place of residence. There is a normal inability to leave the home and doing so requires considerable and taxing effort. Other absences are for medical reasons / religious services and are infrequent or of short duration when for other reasons). If current dressing causes regression in wound condition, may D/C ordered dressing product/s and apply Normal Saline Moist Dressing daily until next [place] / Other MD appointment. [place] of regression in wound condition at [phone]. Please direct any NON-WOUND related issues/requests for orders to patient's Primary Care Physician 1. I would recommend currently that we go ahead and continue with the collagen followed by the saline moistened gauze and behind that seems to be doing well. 2. I am also can recommend the patient continue with appropriate offloading. 3. Bradycardia use Xtrasorb here in the office as we do not have a large enough CarboFlex to cover the wound but due to the odor that he is experiencing where he can switch otherwise to CarboFlex and we will send this order to home health. I think  that may do better for him he is very concerned about the fact that with all the drainage this does tend to have more of an odor that gives him a lot of trouble. We will see patient back for reevaluation in 2 weeks here in the clinic. If anything worsens or changes patient will contact our office for additional recommendations. Electronic Signature(s) Signed: 10/10/2019 1:18:34 PM By: Worthy Keeler PA-C Entered By: Worthy Keeler on 10/10/2019 13:18:34 Bradley [name] ([id number]) -------------------------------------------------------------------------------- SuperBill Details Patient Name: Bradley [name] Date of Service: 10/10/2019 Medical Record Number: [id number] Patient Account Number: [id number] Date of Birth/Sex: May 05, 1946 (74 y.o. M) Treating RN: Army Melia Primary Care Provider: [name] Other Clinician: Referring Provider: [name] Treating Provider/Extender: [name], Samaiya Awadallah Weeks in Treatment: 12 Diagnosis Coding ICD-10 Codes Code Description L89.154 Pressure ulcer of sacral region, stage 4 M86.68 Other chronic osteomyelitis, other site G82.20 Paraplegia, unspecified Facility Procedures CPT4 Code:  [id number] Description: 99213 - WOUND CARE VISIT-LEV 3 EST PT Modifier: Quantity: 1 Physician Procedures CPT4 Code: [id number] Description: [id number] - WC PHYS LEVEL 3 - EST PT Modifier: Quantity: 1 CPT4 Code: Description: ICD-10 Diagnosis Description L89.154 Pressure ulcer of sacral region, stage 4 M86.68 Other chronic osteomyelitis, other site G82.20 Paraplegia, unspecified Modifier: Quantity: Electronic Signature(s) Signed: 10/10/2019 1:18:45 PM By: Worthy Keeler PA-C Entered By: Worthy Keeler on 10/10/2019 13:18:45

## 2019-10-10 NOTE — Progress Notes (Signed)
Bradley [name], Bradley [name] ([id number]) Visit Report for 10/10/2019 Arrival Information Details Patient Name: Bradley [name], Bradley [name]. Date of Service: 10/10/2019 10:45 AM Medical Record Number: [id number] Patient Account Number: [id number] Date of Birth/Sex: 1945-07-08 (74 y.o. M) Treating RN: Montey Hora Primary Care Alexsis Kathman: [name] Other Clinician: Referring Drystan Reader: [name] Treating Weltha Cathy/Extender: [name], HOYT Weeks in Treatment: 12 Visit Information History Since Last Visit Added or deleted any medications: No Patient Arrived: Wheel Chair Any new allergies or adverse reactions: No Arrival Time: 10:31 Had a fall or experienced change in No Accompanied By: self activities of daily living that may affect Transfer Assistance: Harrel Lemon Lift risk of falls: Patient Identification Verified: Yes Signs or symptoms of abuse/neglect since last visito No Secondary Verification Process Completed: Yes Hospitalized since last visit: No Implantable device outside of the clinic excluding No cellular tissue based products placed in the center since last visit: Has Dressing in Place as Prescribed: Yes Pain Present Now: No Electronic Signature(s) Signed: 10/10/2019 4:19:43 PM By: Montey Hora Entered By: Montey Hora on 10/10/2019 10:35:54 Bradley [name] ([id number]) -------------------------------------------------------------------------------- Clinic Level of Care Assessment Details Patient Name: Bradley Loa A. Date of Service: 10/10/2019 10:45 AM Medical Record Number: [id number] Patient Account Number: [id number] Date of Birth/Sex: [birth year] (74 y.o. M) Treating RN: Army Melia Primary Care Faizaan Falls: [name] Other Clinician: Referring Casmir Auguste: [name] Treating Jamaree Hosier/Extender: [name], HOYT Weeks in Treatment: 12 Clinic Level of Care Assessment Items TOOL 4 Quantity Score []  - Use when only an EandM is performed on FOLLOW-UP visit  0 ASSESSMENTS - Nursing Assessment / Reassessment X - Reassessment of Co-morbidities (includes updates in patient status) 1 10 X- 1 5 Reassessment of Adherence to Treatment Plan ASSESSMENTS - Wound and Skin Assessment / Reassessment X - Simple Wound Assessment / Reassessment - one wound 1 5 []  - 0 Complex Wound Assessment / Reassessment - multiple wounds []  - 0 Dermatologic / Skin Assessment (not related to wound area) ASSESSMENTS - Focused Assessment []  - Circumferential Edema Measurements - multi extremities 0 []  - 0 Nutritional Assessment / Counseling / Intervention []  - 0 Lower Extremity Assessment (monofilament, tuning fork, pulses) []  - 0 Peripheral Arterial Disease Assessment (using hand held doppler) ASSESSMENTS - Ostomy and/or Continence Assessment and Care []  - Incontinence Assessment and Management 0 []  - 0 Ostomy Care Assessment and Management (repouching, etc.) PROCESS - Coordination of Care X - Simple Patient / Family Education for ongoing care 1 15 []  - 0 Complex (extensive) Patient / Family Education for ongoing care []  - 0 Staff obtains Programmer, systems, Records, Test Results / Process Orders []  - 0 Staff telephones HHA, Nursing Homes / Clarify orders / etc []  - 0 Routine Transfer to another Facility (non-emergent condition) []  - 0 Routine Hospital Admission (non-emergent condition) []  - 0 New Admissions / Biomedical engineer / Ordering NPWT, Apligraf, etc. []  - 0 Emergency Hospital Admission (emergent condition) X- 1 10 Simple Discharge Coordination []  - 0 Complex (extensive) Discharge Coordination PROCESS - Special Needs []  - Pediatric / Minor Patient Management 0 []  - 0 Isolation Patient Management []  - 0 Hearing / Language / Visual special needs []  - 0 Assessment of Community assistance (transportation, D/C planning, etc.) []  - 0 Additional assistance / Altered mentation []  - 0 Support Surface(s) Assessment (bed, cushion, seat,  etc.) INTERVENTIONS - Wound Cleansing / Measurement Bradley [name], Bradley A. ([id number]) X- 1 5 Simple Wound Cleansing - one wound []  - 0 Complex Wound Cleansing - multiple wounds X- 1 5 Wound  Imaging (photographs - any number of wounds) []  - 0 Wound Tracing (instead of photographs) X- 1 5 Simple Wound Measurement - one wound []  - 0 Complex Wound Measurement - multiple wounds INTERVENTIONS - Wound Dressings []  - Small Wound Dressing one or multiple wounds 0 X- 1 15 Medium Wound Dressing one or multiple wounds []  - 0 Large Wound Dressing one or multiple wounds []  - 0 Application of Medications - topical []  - 0 Application of Medications - injection INTERVENTIONS - Miscellaneous []  - External ear exam 0 []  - 0 Specimen Collection (cultures, biopsies, blood, body fluids, etc.) []  - 0 Specimen(s) / Culture(s) sent or taken to Lab for analysis []  - 0 Patient Transfer (multiple staff / Civil Service fast streamer / Similar devices) []  - 0 Simple Staple / Suture removal (25 or less) []  - 0 Complex Staple / Suture removal (26 or more) []  - 0 Hypo / Hyperglycemic Management (close monitor of Blood Glucose) []  - 0 Ankle / Brachial Index (ABI) - do not check if billed separately X- 1 5 Vital Signs Has the patient been seen at the hospital within the last three years: Yes Total Score: 80 Level Of Care: New/Established - Level 3 Electronic Signature(s) Signed: 10/10/2019 11:36:51 AM By: Army Melia Entered By: Army Melia on 10/10/2019 10:57:27 Bradley [name] ([id number]) -------------------------------------------------------------------------------- Encounter Discharge Information Details Patient Name: Bradley Loa A. Date of Service: 10/10/2019 10:45 AM Medical Record Number: [id number] Patient Account Number: [id number] Date of Birth/Sex: December 29, 1945 (74 y.o. M) Treating RN: Army Melia Primary Care Amarie Tarte: [name] Other Clinician: Referring Epifania Littrell: [name] Treating  Celisa Schoenberg/Extender: [name], HOYT Weeks in Treatment: 12 Encounter Discharge Information Items Discharge Condition: Stable Ambulatory Status: Wheelchair Discharge Destination: Home Transportation: Private Auto Accompanied By: self Schedule Follow-up Appointment: Yes Clinical Summary of Care: Electronic Signature(s) Signed: 10/10/2019 11:36:51 AM By: Army Melia Entered By: Army Melia on 10/10/2019 11:03:51 Bradley [name] ([id number]) -------------------------------------------------------------------------------- Lower Extremity Assessment Details Patient Name: Bradley Loa A. Date of Service: 10/10/2019 10:45 AM Medical Record Number: [id number] Patient Account Number: [id number] Date of Birth/Sex: 1946/03/08 (74 y.o. M) Treating RN: Montey Hora Primary Care Wilba Mutz: [name] Other Clinician: Referring Maddock Finigan: [name] Treating Katerra Ingman/Extender: [name], HOYT Weeks in Treatment: 12 Electronic Signature(s) Signed: 10/10/2019 4:19:43 PM By: Montey Hora Entered By: Montey Hora on 10/10/2019 10:31:15 Bradley [name] ([id number]) -------------------------------------------------------------------------------- Multi Wound Chart Details Patient Name: Bradley Loa A. Date of Service: 10/10/2019 10:45 AM Medical Record Number: [id number] Patient Account Number: [id number] Date of Birth/Sex: March 11, 1946 (74 y.o. M) Treating RN: Army Melia Primary Care Lamarco Gudiel: [name] Other Clinician: Referring Azlaan Isidore: [name] Treating Judithe Keetch/Extender: [name], HOYT Weeks in Treatment: 12 Vital Signs Height(in): Pulse(bpm): 40 Weight(lbs): Blood Pressure(mmHg): 118/59 Body Mass Index(BMI): Temperature(F): 98.2 Respiratory Rate(breaths/min): 16 Photos: [N/A:N/A] Wound Location: Midline Sacrum N/A N/A Wounding Event: Pressure Injury N/A N/A Primary Etiology: Pressure Ulcer N/A N/A Comorbid History: Anemia, Hypertension, History of  N/A N/A pressure wounds, Rheumatoid Arthritis, Paraplegia Date Acquired: 06/07/2015 N/A N/A Weeks of Treatment: 12 N/A N/A Wound Status: Open N/A N/A Measurements L x W x D (cm) 12.5x7x4.4 N/A N/A Area (cm) : [id number] N/A N/A Volume (cm) : 302.378 N/A N/A % Reduction in Area: -33.40% N/A N/A % Reduction in Volume: 2.20% N/A N/A Classification: Category/Stage IV N/A N/A Exudate Amount: Medium N/A N/A Exudate Type: Serosanguineous N/A N/A Exudate Color: red, brown N/A N/A Foul Odor After Cleansing: Yes N/A N/A Odor Anticipated Due to Product No N/A N/A Use:  Wound Margin: Epibole N/A N/A Granulation Amount: Large (67-100%) N/A N/A Granulation Quality: Red N/A N/A Necrotic Amount: Small (1-33%) N/A N/A Exposed Structures: Fat Layer (Subcutaneous Tissue) N/A N/A Exposed: Yes Fascia: No Tendon: No Muscle: No Joint: No Bone: No Epithelialization: None N/A N/A Treatment Notes Electronic Signature(s) Signed: 10/10/2019 11:36:51 AM By: Army Melia Entered By: Army Melia on 10/10/2019 10:55:31 Bradley [name] ([id number]) Bradley [name], Bradley [name] ([id number]) -------------------------------------------------------------------------------- Multi-Disciplinary Care Plan Details Patient Name: Bradley [name], Bradley A. Date of Service: 10/10/2019 10:45 AM Medical Record Number: [id number] Patient Account Number: [id number] Date of Birth/Sex: 06-30-1945 (74 y.o. M) Treating RN: Army Melia Primary Care Ferdie Bakken: [name] Other Clinician: Referring Yanixan Mellinger: [name] Treating Kamel Haven/Extender: [name], HOYT Weeks in Treatment: 12 Active Inactive Abuse / Safety / Falls / Self Care Management Nursing Diagnoses: Impaired physical mobility Goals: Patient will not develop complications from immobility Date Initiated: 07/16/2019 Target Resolution Date: 10/12/2019 Goal Status: Active Interventions: Assess Activities of Daily Living upon admission and as needed Notes: Orientation to the  Wound Care Program Nursing Diagnoses: Knowledge deficit related to the wound healing center program Goals: Patient/caregiver will verbalize understanding of the [place] Program Date Initiated: 07/16/2019 Target Resolution Date: 10/12/2019 Goal Status: Active Interventions: Provide education on orientation to the wound center Notes: Pressure Nursing Diagnoses: Knowledge deficit related to causes and risk factors for pressure ulcer development Goals: Patient will remain free from development of additional pressure ulcers Date Initiated: 07/16/2019 Target Resolution Date: 10/12/2019 Goal Status: Active Interventions: Provide education on pressure ulcers Notes: Wound/Skin Impairment Nursing Diagnoses: Impaired tissue integrity Goals: Ulcer/skin breakdown will heal within 14 weeks Date Initiated: 07/16/2019 Target Resolution Date: 10/12/2019 Goal Status: Active Bradley [name], Bradley [name] ([id number]) Interventions: Assess patient/caregiver ability to obtain necessary supplies Assess patient/caregiver ability to perform ulcer/skin care regimen upon admission and as needed Assess ulceration(s) every visit Notes: Electronic Signature(s) Signed: 10/10/2019 11:36:51 AM By: Army Melia Entered By: Army Melia on 10/10/2019 10:55:23 Bradley [name] ([id number]) -------------------------------------------------------------------------------- Pain Assessment Details Patient Name: Bradley Loa A. Date of Service: 10/10/2019 10:45 AM Medical Record Number: [id number] Patient Account Number: [id number] Date of Birth/Sex: [birth year] (74 y.o. M) Treating RN: Montey Hora Primary Care Krystofer Hevener: [name] Other Clinician: Referring Yordan Martindale: [name] Treating Aryana Wonnacott/Extender: [name], HOYT Weeks in Treatment: 12 Active Problems Location of Pain Severity and Description of Pain Patient Has Paino No Site Locations Pain Management and Medication Current Pain  Management: Electronic Signature(s) Signed: 10/10/2019 4:19:43 PM By: Montey Hora Entered By: Montey Hora on 10/10/2019 10:36:03 Bradley [name] ([id number]) -------------------------------------------------------------------------------- Patient/Caregiver Education Details Patient Name: Bradley Loa A. Date of Service: 10/10/2019 10:45 AM Medical Record Number: [id number] Patient Account Number: [id number] Date of Birth/Gender: 01/13/1946 (74 y.o. M) Treating RN: Army Melia Primary Care Physician: [name] Other Clinician: Referring Physician: [name] Treating Physician/Extender: Sharalyn Ink in Treatment: 12 Education Assessment Education Provided To: Patient Education Topics Provided Wound/Skin Impairment: Handouts: Caring for Your Ulcer Methods: Demonstration, Explain/Verbal Responses: State content correctly Electronic Signature(s) Signed: 10/10/2019 11:36:51 AM By: Army Melia Entered By: Army Melia on 10/10/2019 10:57:47 Bradley [name] ([id number]) -------------------------------------------------------------------------------- Wound Assessment Details Patient Name: Bradley Loa A. Date of Service: 10/10/2019 10:45 AM Medical Record Number: [id number] Patient Account Number: [id number] Date of Birth/Sex: 05/20/1946 (74 y.o. M) Treating RN: Montey Hora Primary Care Zymeir Salminen: [name] Other Clinician: Referring Jumanah Hynson: [name] Treating Albertina Leise/Extender: [name], HOYT Weeks in Treatment: 12 Wound Status Wound Number: 10 Primary Pressure Ulcer Etiology: Wound Location:  Midline Sacrum Wound Open Wounding Event: Pressure Injury Status: Date Acquired: 06/07/2015 Comorbid Anemia, Hypertension, History of pressure wounds, Weeks Of Treatment: 12 History: Rheumatoid Arthritis, Paraplegia Clustered Wound: No Photos Wound Measurements Length: (cm) 12.5 % Redu Width: (cm) 7 % Redu Depth: (cm) 4.4 Epithe Area:  (cm) 68.722 [place] Volume: (cm) 302.378 Under ction in Area: -33.4% ction in Volume: 2.2% lialization: None ling: No mining: No Wound Description Classification: Category/Stage IV Foul O Wound Margin: Epibole Due to Exudate Amount: Medium Slough Exudate Type: Serosanguineous Exudate Color: red, brown dor After Cleansing: Yes Product Use: No /Fibrino Yes Wound Bed Granulation Amount: Large (67-100%) Exposed Structure Granulation Quality: Red Fascia Exposed: No Necrotic Amount: Small (1-33%) Fat Layer (Subcutaneous Tissue) Exposed: Yes Necrotic Quality: Adherent Slough Tendon Exposed: No Muscle Exposed: No Joint Exposed: No Bone Exposed: No Treatment Notes Wound #10 (Midline Sacrum) Notes collagen, wet to dry gauze, xsorb, abd and tape Electronic Signature(s) Signed: 10/10/2019 4:19:43 PM By: Gaynelle Adu ([id number]) Entered By: Montey Hora on 10/10/2019 10:50:49 Bradley [name] ([id number]) -------------------------------------------------------------------------------- Vitals Details Patient Name: Bradley Loa A. Date of Service: 10/10/2019 10:45 AM Medical Record Number: [id number] Patient Account Number: [id number] Date of Birth/Sex: [birth year] (74 y.o. M) Treating RN: Montey Hora Primary Care Torrian Canion: [name] Other Clinician: Referring Myreon Wimer: [name] Treating Simone Rodenbeck/Extender: [name], HOYT Weeks in Treatment: 12 Vital Signs Time Taken: 10:36 Temperature (F): 98.2 Pulse (bpm): 76 Respiratory Rate (breaths/min): 16 Blood Pressure (mmHg): 118/59 Reference Range: 80 - 120 mg / dl Electronic Signature(s) Signed: 10/10/2019 4:19:43 PM By: Montey Hora Entered By: Montey Hora on 10/10/2019 10:36:47

## 2019-10-11 DIAGNOSIS — S24104S Unspecified injury at T11-T12 level of thoracic spinal cord, sequela: Secondary | ICD-10-CM | POA: Diagnosis not present

## 2019-10-11 DIAGNOSIS — J309 Allergic rhinitis, unspecified: Secondary | ICD-10-CM | POA: Diagnosis not present

## 2019-10-11 DIAGNOSIS — N400 Enlarged prostate without lower urinary tract symptoms: Secondary | ICD-10-CM | POA: Diagnosis not present

## 2019-10-11 DIAGNOSIS — L89154 Pressure ulcer of sacral region, stage 4: Secondary | ICD-10-CM | POA: Diagnosis not present

## 2019-10-11 DIAGNOSIS — D509 Iron deficiency anemia, unspecified: Secondary | ICD-10-CM | POA: Diagnosis not present

## 2019-10-11 DIAGNOSIS — G8220 Paraplegia, unspecified: Secondary | ICD-10-CM | POA: Diagnosis not present

## 2019-10-11 DIAGNOSIS — N319 Neuromuscular dysfunction of bladder, unspecified: Secondary | ICD-10-CM | POA: Diagnosis not present

## 2019-10-11 DIAGNOSIS — Z933 Colostomy status: Secondary | ICD-10-CM | POA: Diagnosis not present

## 2019-10-11 DIAGNOSIS — I10 Essential (primary) hypertension: Secondary | ICD-10-CM | POA: Diagnosis not present

## 2019-10-14 DIAGNOSIS — N319 Neuromuscular dysfunction of bladder, unspecified: Secondary | ICD-10-CM | POA: Diagnosis not present

## 2019-10-14 DIAGNOSIS — G8220 Paraplegia, unspecified: Secondary | ICD-10-CM | POA: Diagnosis not present

## 2019-10-14 DIAGNOSIS — Z933 Colostomy status: Secondary | ICD-10-CM | POA: Diagnosis not present

## 2019-10-14 DIAGNOSIS — N400 Enlarged prostate without lower urinary tract symptoms: Secondary | ICD-10-CM | POA: Diagnosis not present

## 2019-10-14 DIAGNOSIS — I10 Essential (primary) hypertension: Secondary | ICD-10-CM | POA: Diagnosis not present

## 2019-10-14 DIAGNOSIS — S24104S Unspecified injury at T11-T12 level of thoracic spinal cord, sequela: Secondary | ICD-10-CM | POA: Diagnosis not present

## 2019-10-14 DIAGNOSIS — J309 Allergic rhinitis, unspecified: Secondary | ICD-10-CM | POA: Diagnosis not present

## 2019-10-14 DIAGNOSIS — L89154 Pressure ulcer of sacral region, stage 4: Secondary | ICD-10-CM | POA: Diagnosis not present

## 2019-10-14 DIAGNOSIS — D509 Iron deficiency anemia, unspecified: Secondary | ICD-10-CM | POA: Diagnosis not present

## 2019-10-16 DIAGNOSIS — N319 Neuromuscular dysfunction of bladder, unspecified: Secondary | ICD-10-CM | POA: Diagnosis not present

## 2019-10-16 DIAGNOSIS — S24104S Unspecified injury at T11-T12 level of thoracic spinal cord, sequela: Secondary | ICD-10-CM | POA: Diagnosis not present

## 2019-10-16 DIAGNOSIS — J309 Allergic rhinitis, unspecified: Secondary | ICD-10-CM | POA: Diagnosis not present

## 2019-10-16 DIAGNOSIS — N400 Enlarged prostate without lower urinary tract symptoms: Secondary | ICD-10-CM | POA: Diagnosis not present

## 2019-10-16 DIAGNOSIS — Z933 Colostomy status: Secondary | ICD-10-CM | POA: Diagnosis not present

## 2019-10-16 DIAGNOSIS — D509 Iron deficiency anemia, unspecified: Secondary | ICD-10-CM | POA: Diagnosis not present

## 2019-10-16 DIAGNOSIS — G8220 Paraplegia, unspecified: Secondary | ICD-10-CM | POA: Diagnosis not present

## 2019-10-16 DIAGNOSIS — I10 Essential (primary) hypertension: Secondary | ICD-10-CM | POA: Diagnosis not present

## 2019-10-16 DIAGNOSIS — L89154 Pressure ulcer of sacral region, stage 4: Secondary | ICD-10-CM | POA: Diagnosis not present

## 2019-10-18 DIAGNOSIS — N319 Neuromuscular dysfunction of bladder, unspecified: Secondary | ICD-10-CM | POA: Diagnosis not present

## 2019-10-18 DIAGNOSIS — D509 Iron deficiency anemia, unspecified: Secondary | ICD-10-CM | POA: Diagnosis not present

## 2019-10-18 DIAGNOSIS — L89154 Pressure ulcer of sacral region, stage 4: Secondary | ICD-10-CM | POA: Diagnosis not present

## 2019-10-18 DIAGNOSIS — S24104S Unspecified injury at T11-T12 level of thoracic spinal cord, sequela: Secondary | ICD-10-CM | POA: Diagnosis not present

## 2019-10-18 DIAGNOSIS — N400 Enlarged prostate without lower urinary tract symptoms: Secondary | ICD-10-CM | POA: Diagnosis not present

## 2019-10-18 DIAGNOSIS — G8220 Paraplegia, unspecified: Secondary | ICD-10-CM | POA: Diagnosis not present

## 2019-10-18 DIAGNOSIS — I10 Essential (primary) hypertension: Secondary | ICD-10-CM | POA: Diagnosis not present

## 2019-10-18 DIAGNOSIS — J309 Allergic rhinitis, unspecified: Secondary | ICD-10-CM | POA: Diagnosis not present

## 2019-10-18 DIAGNOSIS — Z933 Colostomy status: Secondary | ICD-10-CM | POA: Diagnosis not present

## 2019-10-19 DIAGNOSIS — D509 Iron deficiency anemia, unspecified: Secondary | ICD-10-CM | POA: Diagnosis not present

## 2019-10-19 DIAGNOSIS — S24104S Unspecified injury at T11-T12 level of thoracic spinal cord, sequela: Secondary | ICD-10-CM | POA: Diagnosis not present

## 2019-10-19 DIAGNOSIS — Z933 Colostomy status: Secondary | ICD-10-CM | POA: Diagnosis not present

## 2019-10-19 DIAGNOSIS — J309 Allergic rhinitis, unspecified: Secondary | ICD-10-CM | POA: Diagnosis not present

## 2019-10-19 DIAGNOSIS — N400 Enlarged prostate without lower urinary tract symptoms: Secondary | ICD-10-CM | POA: Diagnosis not present

## 2019-10-19 DIAGNOSIS — G8220 Paraplegia, unspecified: Secondary | ICD-10-CM | POA: Diagnosis not present

## 2019-10-19 DIAGNOSIS — I10 Essential (primary) hypertension: Secondary | ICD-10-CM | POA: Diagnosis not present

## 2019-10-19 DIAGNOSIS — L89154 Pressure ulcer of sacral region, stage 4: Secondary | ICD-10-CM | POA: Diagnosis not present

## 2019-10-19 DIAGNOSIS — N319 Neuromuscular dysfunction of bladder, unspecified: Secondary | ICD-10-CM | POA: Diagnosis not present

## 2019-10-21 DIAGNOSIS — G8220 Paraplegia, unspecified: Secondary | ICD-10-CM | POA: Diagnosis not present

## 2019-10-21 DIAGNOSIS — N319 Neuromuscular dysfunction of bladder, unspecified: Secondary | ICD-10-CM | POA: Diagnosis not present

## 2019-10-21 DIAGNOSIS — J309 Allergic rhinitis, unspecified: Secondary | ICD-10-CM | POA: Diagnosis not present

## 2019-10-21 DIAGNOSIS — Z933 Colostomy status: Secondary | ICD-10-CM | POA: Diagnosis not present

## 2019-10-21 DIAGNOSIS — S24104S Unspecified injury at T11-T12 level of thoracic spinal cord, sequela: Secondary | ICD-10-CM | POA: Diagnosis not present

## 2019-10-21 DIAGNOSIS — N400 Enlarged prostate without lower urinary tract symptoms: Secondary | ICD-10-CM | POA: Diagnosis not present

## 2019-10-21 DIAGNOSIS — D509 Iron deficiency anemia, unspecified: Secondary | ICD-10-CM | POA: Diagnosis not present

## 2019-10-21 DIAGNOSIS — I10 Essential (primary) hypertension: Secondary | ICD-10-CM | POA: Diagnosis not present

## 2019-10-21 DIAGNOSIS — L89154 Pressure ulcer of sacral region, stage 4: Secondary | ICD-10-CM | POA: Diagnosis not present

## 2019-10-23 DIAGNOSIS — J309 Allergic rhinitis, unspecified: Secondary | ICD-10-CM | POA: Diagnosis not present

## 2019-10-23 DIAGNOSIS — S24104S Unspecified injury at T11-T12 level of thoracic spinal cord, sequela: Secondary | ICD-10-CM | POA: Diagnosis not present

## 2019-10-23 DIAGNOSIS — Z933 Colostomy status: Secondary | ICD-10-CM | POA: Diagnosis not present

## 2019-10-23 DIAGNOSIS — G8220 Paraplegia, unspecified: Secondary | ICD-10-CM | POA: Diagnosis not present

## 2019-10-23 DIAGNOSIS — L89154 Pressure ulcer of sacral region, stage 4: Secondary | ICD-10-CM | POA: Diagnosis not present

## 2019-10-23 DIAGNOSIS — N400 Enlarged prostate without lower urinary tract symptoms: Secondary | ICD-10-CM | POA: Diagnosis not present

## 2019-10-23 DIAGNOSIS — N319 Neuromuscular dysfunction of bladder, unspecified: Secondary | ICD-10-CM | POA: Diagnosis not present

## 2019-10-23 DIAGNOSIS — D509 Iron deficiency anemia, unspecified: Secondary | ICD-10-CM | POA: Diagnosis not present

## 2019-10-23 DIAGNOSIS — I10 Essential (primary) hypertension: Secondary | ICD-10-CM | POA: Diagnosis not present

## 2019-10-24 ENCOUNTER — Encounter: Payer: Medicare HMO | Admitting: Internal Medicine

## 2019-10-24 ENCOUNTER — Other Ambulatory Visit: Payer: Self-pay

## 2019-10-24 DIAGNOSIS — G8220 Paraplegia, unspecified: Secondary | ICD-10-CM | POA: Diagnosis not present

## 2019-10-24 DIAGNOSIS — L89154 Pressure ulcer of sacral region, stage 4: Secondary | ICD-10-CM | POA: Diagnosis not present

## 2019-10-24 DIAGNOSIS — M8668 Other chronic osteomyelitis, other site: Secondary | ICD-10-CM | POA: Diagnosis not present

## 2019-10-25 DIAGNOSIS — Z933 Colostomy status: Secondary | ICD-10-CM | POA: Diagnosis not present

## 2019-10-25 DIAGNOSIS — S24103D Unspecified injury at T7-T10 level of thoracic spinal cord, subsequent encounter: Secondary | ICD-10-CM | POA: Diagnosis not present

## 2019-10-25 DIAGNOSIS — N400 Enlarged prostate without lower urinary tract symptoms: Secondary | ICD-10-CM | POA: Diagnosis not present

## 2019-10-25 DIAGNOSIS — S24104S Unspecified injury at T11-T12 level of thoracic spinal cord, sequela: Secondary | ICD-10-CM | POA: Diagnosis not present

## 2019-10-25 DIAGNOSIS — G8220 Paraplegia, unspecified: Secondary | ICD-10-CM | POA: Diagnosis not present

## 2019-10-25 DIAGNOSIS — D509 Iron deficiency anemia, unspecified: Secondary | ICD-10-CM | POA: Diagnosis not present

## 2019-10-25 DIAGNOSIS — L89154 Pressure ulcer of sacral region, stage 4: Secondary | ICD-10-CM | POA: Diagnosis not present

## 2019-10-25 DIAGNOSIS — I10 Essential (primary) hypertension: Secondary | ICD-10-CM | POA: Diagnosis not present

## 2019-10-25 DIAGNOSIS — Z87891 Personal history of nicotine dependence: Secondary | ICD-10-CM | POA: Diagnosis not present

## 2019-10-25 DIAGNOSIS — J309 Allergic rhinitis, unspecified: Secondary | ICD-10-CM | POA: Diagnosis not present

## 2019-10-25 DIAGNOSIS — N319 Neuromuscular dysfunction of bladder, unspecified: Secondary | ICD-10-CM | POA: Diagnosis not present

## 2019-10-25 NOTE — Progress Notes (Signed)
MOICES, MORRICE ([id number]) Visit Report for 10/24/2019 Arrival Information Details Patient Name: Bradley [name], Bradley [name]. Date of Service: 10/24/2019 10:30 AM Medical Record Number: [id number] Patient Account Number: [id number] Date of Birth/Sex: Apr 22, 1946 (74 y.o. M) Treating RN: Montey Hora Primary Care Provider: [name] Other Clinician: Referring Provider: [name] Treating Provider/Extender: Tito Dine in Treatment: 14 Visit Information History Since Last Visit Added or deleted any medications: No Patient Arrived: Wheel Chair Any new allergies or adverse reactions: No Arrival Time: 10:49 Had a fall or experienced change in No Accompanied By: self activities of daily living that may affect Transfer Assistance: Harrel Lemon Lift risk of falls: Patient Identification Verified: Yes Signs or symptoms of abuse/neglect since last visito No Secondary Verification Process Completed: Yes Hospitalized since last visit: No Implantable device outside of the clinic excluding No cellular tissue based products placed in the center since last visit: Has Dressing in Place as Prescribed: Yes Pain Present Now: No Electronic Signature(s) Signed: 10/24/2019 5:37:23 PM By: Montey Hora Entered By: Montey Hora on 10/24/2019 10:53:05 [name] -------------------------------------------------------------------------------- Encounter Discharge Information Details Patient Name: Bradley Loa A. Date of Service: 10/24/2019 10:30 AM Medical Record Number: [id number] Patient Account Number: [id number] Date of Birth/Sex: [birth year] (74 y.o. M) Treating RN: Army Melia Primary Care Provider: [name] Other Clinician: Referring Provider: [name] Treating Provider/Extender: Tito Dine in Treatment: 14 Encounter Discharge Information Items Discharge Condition: Stable Ambulatory Status: Wheelchair Discharge Destination:  Home Transportation: Private Auto Accompanied By: self Schedule Follow-up Appointment: Yes Clinical Summary of Care: Electronic Signature(s) Signed: 10/24/2019 5:18:09 PM By: Army Melia Entered By: Army Melia on 10/24/2019 11:46:25 [name] -------------------------------------------------------------------------------- Lower Extremity Assessment Details Patient Name: Bradley Loa A. Date of Service: 10/24/2019 10:30 AM Medical Record Number: [id number] Patient Account Number: [id number] Date of Birth/Sex: [birth year] (74 y.o. M) Treating RN: Montey Hora Primary Care Provider: [name] Other Clinician: Referring Provider: [name] Treating Provider/Extender: Tito Dine in Treatment: 14 Electronic Signature(s) Signed: 10/24/2019 5:37:23 PM By: Montey Hora Entered By: Montey Hora on 10/24/2019 11:00:48 [name] -------------------------------------------------------------------------------- Multi Wound Chart Details Patient Name: [name], [name] Date of Service: 10/24/2019 10:30 AM Medical Record Number: [id number] Patient Account Number: [id number] Date of Birth/Sex: [birth year] (74 y.o. M) Treating RN: Army Melia Primary Care Provider: [name] Other Clinician: Referring Provider: [name] Treating Provider/Extender: Tito Dine in Treatment: 14 Vital Signs Height(in): Pulse(bpm): 47 Weight(lbs): Blood Pressure(mmHg): 122/65 Body Mass Index(BMI): Temperature(F): 98.0 Respiratory Rate(breaths/min): 16 Photos: [N/A:N/A] Wound Location: Midline Sacrum Left Gluteal fold N/A Wounding Event: Pressure Injury Trauma N/A Primary Etiology: Pressure Ulcer Skin Tear N/A Comorbid History: Anemia, Hypertension, History of Anemia, Hypertension, History of N/A pressure wounds, Rheumatoid pressure wounds, Rheumatoid Arthritis, Paraplegia Arthritis, Paraplegia Date  Acquired: 06/07/2015 10/21/2019 N/A Weeks of Treatment: 14 0 N/A Wound Status: Open Open N/A Measurements L x W x D (cm) 12x7.4x4.6 3.4x1.1x0.1 N/A Area (cm) : 69.743 2.937 N/A Volume (cm) : 320.819 0.294 N/A % Reduction in Area: -35.40% N/A N/A % Reduction in Volume: -3.80% N/A N/A Classification: Category/Stage IV Full Thickness Without Exposed N/A Support Structures Exudate Amount: Medium Medium N/A Exudate Type: Serosanguineous Serous N/A Exudate Color: red, brown amber N/A Foul Odor After Cleansing: Yes No N/A Odor Anticipated Due to Product No N/A N/A Use: Wound Margin: Epibole Flat and Intact N/A Granulation Amount: Large (67-100%) Large (67-100%) N/A Granulation Quality: Red Pink N/A Necrotic Amount: Small (1-33%) Small (1-33%) N/A  Exposed Structures: Fat Layer (Subcutaneous Tissue) Fat Layer (Subcutaneous Tissue) N/A Exposed: Yes Exposed: Yes Fascia: No Fascia: No Tendon: No Tendon: No Muscle: No Muscle: No Joint: No Joint: No Bone: No Bone: No Epithelialization: None Small (1-33%) N/A Treatment Notes Wound #10 (Midline Sacrum) Notes APOLLO, MACHER A. ([id number]) prisma, wet to dry, xsorb to midline sacrum prisma BFD to left glut Wound #11 (Left Gluteal fold) Notes prisma, wet to dry, xsorb to midline sacrum prisma BFD to left glut Electronic Signature(s) Signed: 10/24/2019 5:46:06 PM By: Linton Ham MD Entered By: Linton Ham on 10/24/2019 12:29:44 [name] -------------------------------------------------------------------------------- Multi-Disciplinary Care Plan Details Patient Name: [name], [name] Date of Service: 10/24/2019 10:30 AM Medical Record Number: [id number] Patient Account Number: [id number] Date of Birth/Sex: 1946-03-11 (74 y.o. M) Treating RN: Army Melia Primary Care Provider: [name] Other Clinician: Referring Provider: [name] Treating Provider/Extender: Tito Dine in  Treatment: 14 Active Inactive Abuse / Safety / Falls / Self Care Management Nursing Diagnoses: Impaired physical mobility Goals: Patient will not develop complications from immobility Date Initiated: 07/16/2019 Target Resolution Date: 10/12/2019 Goal Status: Active Interventions: Assess Activities of Daily Living upon admission and as needed Notes: Orientation to the Wound Care Program Nursing Diagnoses: Knowledge deficit related to the wound healing center program Goals: Patient/caregiver will verbalize understanding of the [facility] Date Initiated: 07/16/2019 Target Resolution Date: 10/12/2019 Goal Status: Active Interventions: Provide education on orientation to the wound center Notes: Pressure Nursing Diagnoses: Knowledge deficit related to causes and risk factors for pressure ulcer development Goals: Patient will remain free from development of additional pressure ulcers Date Initiated: 07/16/2019 Target Resolution Date: 10/12/2019 Goal Status: Active Interventions: Provide education on pressure ulcers Notes: Wound/Skin Impairment Nursing Diagnoses: Impaired tissue integrity Goals: Ulcer/skin breakdown will heal within 14 weeks Date Initiated: 07/16/2019 Target Resolution Date: 10/12/2019 Goal Status: Active DINO, WESTRUM ([id number]) Interventions: Assess patient/caregiver ability to obtain necessary supplies Assess patient/caregiver ability to perform ulcer/skin care regimen upon admission and as needed Assess ulceration(s) every visit Notes: Electronic Signature(s) Signed: 10/24/2019 5:18:09 PM By: Army Melia Entered By: Army Melia on 10/24/2019 11:42:01 [name] -------------------------------------------------------------------------------- Pain Assessment Details Patient Name: Bradley Loa A. Date of Service: 10/24/2019 10:30 AM Medical Record Number: [id number] Patient Account Number: [id number] Date of Birth/Sex:  [birth year] (74 y.o. M) Treating RN: Montey Hora Primary Care Provider: [name] Other Clinician: Referring Provider: [name] Treating Provider/Extender: Tito Dine in Treatment: 14 Active Problems Location of Pain Severity and Description of Pain Patient Has Paino No Site Locations Pain Management and Medication Current Pain Management: Electronic Signature(s) Signed: 10/24/2019 5:37:23 PM By: Montey Hora Entered By: Montey Hora on 10/24/2019 11:00:41 [name] -------------------------------------------------------------------------------- Patient/Caregiver Education Details Patient Name: Bradley Loa A. Date of Service: 10/24/2019 10:30 AM Medical Record Number: [id number] Patient Account Number: [id number] Date of Birth/Gender: [birth year] (74 y.o. M) Treating RN: Army Melia Primary Care Physician: [name] Other Clinician: Referring Physician: [name] Treating Physician/Extender: Tito Dine in Treatment: 14 Education Assessment Education Provided To: Patient Education Topics Provided Wound/Skin Impairment: Handouts: Caring for Your Ulcer Methods: Demonstration, Explain/Verbal Responses: State content correctly Electronic Signature(s) Signed: 10/24/2019 5:18:09 PM By: Army Melia Entered By: Army Melia on 10/24/2019 11:45:13 [name] -------------------------------------------------------------------------------- Wound Assessment Details Patient Name: Bradley Loa A. Date of Service: 10/24/2019 10:30 AM Medical Record Number: [id number] Patient Account Number: [id number] Date of Birth/Sex: 02-17-1946 (74 y.o. M) Treating RN: Marjory Lies,  Di Kindle Primary Care Provider: [name] Other Clinician: Referring Provider: [name] Treating Provider/Extender: Tito Dine in Treatment: 14 Wound Status Wound Number: 10 Primary Pressure  Ulcer Etiology: Wound Location: Midline Sacrum Wound Open Wounding Event: Pressure Injury Status: Date Acquired: 06/07/2015 Comorbid Anemia, Hypertension, History of pressure wounds, Weeks Of Treatment: 14 History: Rheumatoid Arthritis, Paraplegia Clustered Wound: No Photos Wound Measurements Length: (cm) 12 % Redu Width: (cm) 7.4 % Redu Depth: (cm) 4.6 Epithe Area: (cm) 69.743 [place] Volume: (cm) 320.819 Under ction in Area: -35.4% ction in Volume: -3.8% lialization: None ling: No mining: No Wound Description Classification: Category/Stage IV Foul O Wound Margin: Epibole Due to Exudate Amount: Medium Slough Exudate Type: Serosanguineous Exudate Color: red, brown dor After Cleansing: Yes Product Use: No /Fibrino Yes Wound Bed Granulation Amount: Large (67-100%) Exposed Structure Granulation Quality: Red Fascia Exposed: No Necrotic Amount: Small (1-33%) Fat Layer (Subcutaneous Tissue) Exposed: Yes Necrotic Quality: Adherent Slough Tendon Exposed: No Muscle Exposed: No Joint Exposed: No Bone Exposed: No Treatment Notes Wound #10 (Midline Sacrum) Notes prisma, wet to dry, xsorb to midline sacrum prisma BFD to left glut Electronic Signature(s) NEQUAN, FRANCHINA ([id number]) Signed: 10/24/2019 5:37:23 PM By: Montey Hora Entered By: Montey Hora on 10/24/2019 11:10:40 [name] -------------------------------------------------------------------------------- Wound Assessment Details Patient Name: Bradley Loa A. Date of Service: 10/24/2019 10:30 AM Medical Record Number: [id number] Patient Account Number: [id number] Date of Birth/Sex: 1945-07-01 (74 y.o. M) Treating RN: Montey Hora Primary Care Provider: [name] Other Clinician: Referring Provider: [name] Treating Provider/Extender: Tito Dine in Treatment: 14 Wound Status Wound Number: 11 Primary Skin Tear Etiology: Wound Location: Left Gluteal  fold Wound Open Wounding Event: Trauma Status: Date Acquired: 10/21/2019 Comorbid Anemia, Hypertension, History of pressure wounds, Weeks Of Treatment: 0 History: Rheumatoid Arthritis, Paraplegia Clustered Wound: No Photos Wound Measurements Length: (cm) 3.4 Width: (cm) 1.1 Depth: (cm) 0.1 Area: (cm) 2.937 Volume: (cm) 0.294 % Reduction in Area: % Reduction in Volume: Epithelialization: Small (1-33%) Tunneling: No Undermining: No Wound Description Classification: Full Thickness Without Exposed Support Structu Wound Margin: Flat and Intact Exudate Amount: Medium Exudate Type: Serous Exudate Color: amber res Foul Odor After Cleansing: No Slough/Fibrino Yes Wound Bed Granulation Amount: Large (67-100%) Exposed Structure Granulation Quality: Pink Fascia Exposed: No Necrotic Amount: Small (1-33%) Fat Layer (Subcutaneous Tissue) Exposed: Yes Necrotic Quality: Adherent Slough Tendon Exposed: No Muscle Exposed: No Joint Exposed: No Bone Exposed: No Treatment Notes Wound #11 (Left Gluteal fold) Notes prisma, wet to dry, xsorb to midline sacrum prisma BFD to left glut Electronic Signature(s) DALTON, CURTISS ([id number]) Signed: 10/24/2019 5:37:23 PM By: Montey Hora Entered By: Montey Hora on 10/24/2019 11:12:28 [name] -------------------------------------------------------------------------------- Vitals Details Patient Name: Bradley Loa A. Date of Service: 10/24/2019 10:30 AM Medical Record Number: [id number] Patient Account Number: [id number] Date of Birth/Sex: 1945-09-09 (74 y.o. M) Treating RN: Montey Hora Primary Care Provider: [name] Other Clinician: Referring Provider: [name] Treating Provider/Extender: Tito Dine in Treatment: 14 Vital Signs Time Taken: 10:53 Temperature (F): 98.0 Pulse (bpm): 78 Respiratory Rate (breaths/min): 16 Blood Pressure (mmHg): 122/65 Reference Range: 80 -  120 mg / dl Electronic Signature(s) Signed: 10/24/2019 5:37:23 PM By: Montey Hora Entered By: Montey Hora on 10/24/2019 11:00:33

## 2019-10-25 NOTE — Progress Notes (Signed)
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 with a infectious disease specialist in two days on 01/12/17. Otherwise he was transferred back to [place] for continuing to monitor and manage is wound as she progresses with a Wound VAC for the time being. 01/17/17 on evaluation today patient continues to show evidence of slight improvement with  the Wound VAC fortunately there's no evidence of infection or otherwise worsening condition in general. Nonetheless we were unable to get him switch to advanced homecare in regard to home help from his current company. I'm not sure the reasoning behind but for some reason he was not accepted as a patient with him. Continue to apply the Wound VAC which does still show that some maceration around the wound edges but the wound measurements were slightly improved. No fevers, chills, nausea, or vomiting noted at this time. 02/14/17; this patient I have not seen in 5 months although he has been readmitted to our clinic seen by our physician assistant Jeri Cos twice in early August. I have looked through [facility] notes care everywhere. The patient saw plastic surgery in May [Dr. Bhatt}. The patient was sent to general surgery and ultimately had  a colostomy placed. On 11/29/16. This was after he was admitted to [facility] sometime in May. An MRI of the pelvis on 5/23 showed osteomyelitis of the coccyx. An attempt was made to drain fluid that was not successful. He was treated with empiric broad-spectrum antibiotics VAC/cefepime/Flagyl starting on 11/02/16 with plans for a 6 week course. According to their notes he was sent to a nursing home. Was last seen by Dr. Myriam Jacobson of plastic surgery on 12/28/16. The first part of the note is a long dissertation about the difficulties finding adequate patients for flap closure of pressure ulcers. At that time the wound was noted to be stage IV based I think on underlying infection no exposed bone and healthy granulation tissue. 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 He was last seen by infectious disease 01/12/17 A Dr. Uvaldo Rising. His note says that Mr. Kreikemeier was not interested in a flap closure for referring a trial of the wound VAC. As previously anticipated the wound VAC could not be maintained as an outpatient in the community. He is now  using something similar to a Dakin's wet to dry recommended by [facility] VASHE solution. He is placing this twice a day himself. This is almost s hopeless setting in terms of heeling 02/28/17; he is using a Dakin's wet to dry. Most of the wound surface looks satisfactory however the deeper area over his coccyx now has exposed bone I'm not sure if I noted this last week. 03/21/17; patient is usingVASHE solution wet to dry which I gather is a variation on Dakin solution. He has home health changing this 3 times a week the other days he does this himself. His appointment with plastic surgery 04/18/17; patient continues to use a variant of Dakin solution I believe. His wound continues to have a clean viable surface. The 2 areas of exposed bone in the center of this wound had closed over. He has an appointment with plastic surgery on December 5 at which time I hope that there'll be a plan for myocutaneous flap closure In looking through [place] link I couldn't find any more plastic surgery appointments. I did come across the fact that he is been followed by hematology for a microcytic hypochromic anemia. He had a reasonably normal looking hemoglobin electrophoresis. His iron level was 10 and according to the patient he is going for IV iron infusions starting tomorrow. He had a sedimentation rate of 74. More problematically from a pure wound care point of view his albumin was 2.7 earlier this month 05/17/17; this is a patient I follow monthly. He has a large now stage IV wound over his bilateral buttocks with close proximity to his anal Bradley [name], Bradley A. ([id number]) opening. More recently he has developed a large area with exposed bone in the center of this probably secondary to the underlying osteomyelitis E had in the summer. He also follows with Dr. Myriam Jacobson at [facility] who is plastic surgery. He had an appointment earlier this month and according to the patient Dr. Myriam Jacobson does not want to proceed with any  attempted flap closure. Although I do not have current access to her note in care everywhere this is likely due to exposed bone. Again according to the patient they did a bone biopsy. He is still using a variant of Dakin solution changing twice a day. He has home health. The patient is not able to give me a firm answer about how long he spends on  this in his wheelchair The patient also states that Dr. Myriam Jacobson wanted to reconsider a wound VAC. I really don't see this as a viable option at least not in the outpatient setting. The wound itself is frankly to close to his anal opening to maintain a seal. The last time we tried to do this home health was unable to manage it. It might be possible to maintain a wound VAC in this setting outside of the home such as a skilled nursing facility or an [place] however I am doubtful about this even in that setting **** READMISSION 09/21/17-He is here for evaluation of stage IV sacral ulcer. Since his last evaluation here in December he has completed treatment for sacral osteomyelitis. He was at [facility] for IV therapy and NPWT dressing changes. He was discharged, with home health services, in February. He admits that while in the skilled facility he had "80%" success with maintaining dressing, since discharge he has had approximately "40%" success with maintaining wound VAC dressing. We discussed at length that this is not a safe or recommended option. We will apply Dakin's wet to dry dressing daily and he will follow-up next week. He is accompanied today by his sister who is willing to assist in dressing changes; they will discuss the social issue as he feels he is capable of changing dressing daily when home health is not able. 09/28/17-He is here in follow-up evaluation for stage IV sacral pressure ulcer. He has been using the Dakin's wet-to-dry daily; he continues with home health. He is not accompanied by anyone at this visit. He will follow up in two  weeks per his request/preference. 10/12/17 on evaluation today patient appears to be doing very well. The Dakin solution went to dry packings do seem to be helping him as far as the sacral wound is concerned I'm not seeing anything that has me more concerned as far as infection or otherwise is concerned. Overall I'm pleased with the appearance of the wound. 10/26/17-He is here in follow up evaluation for a stage IV sacral ulcer. He continues with daily Dakin's wet-to-dry. He is voicing no complaints or concerns. He will follow-up in 2 weeks 11/16/17-He is here for follow up evaluation for a palliative stage IV sacral pressure ulcer. We will continue with Dakin's wet-to-dry. He will follow- up in 4 weeks. He is expressing concern/complaint regarding new bed that has arrived, stating he is unable to manipulate/maneuver it due to the bed crank being at the foot of the bed. 12/14/17-He is here for evaluation for palliative stage IV sacral ulcer. He is voicing no complaints or concerns. We will continue with one-to-one ratio of saline and dakins. He will follow-up in 3 weeks 01/04/18-He is here in follow-up evaluation for palliative stage IV sacral ulcer. He is inquiring about reinstating the negative pressure wound therapy. We discussed at length that the negative pressure wound therapy in the home setting has not been successful for him repeatedly with loss of cereal and unavailability of 24/7 help; reminded him that home health is not available 24/7 when loss of seal occurs. He does verbalize understanding to this and does not pursue. We also discussed the palliative nature of this ulcer (given no significant change/improvement in measurement/appearance, not a candidate for muscle flap per plastic surgery, and continued independent living) and that the goal is for maintenance, decrease in infection and minimizing/avoiding deterioration given that he is independent in his care, does not have home health  and requires daily dressing changes  secondary to drainage amount. He is inquiring about a wound clinic in [facility], I have informed him that I am unfamiliar with that clinic but that he is encouraged to seek another opinion if that is his desire. We will continue with dakins and he will follow up in three weeks 01/25/18-He is here in follow-up evaluation for palliative stage IV sacral ulcer. He continues with Dakin's/saline 1:1 mixture wet to dry dressing changes. He states he has an appointment at [facility] on 9/17 for evaluation of surgical intervention/closure of the sacral ulcer. He will follow-up here in 4 weeks Readmission: 07/16/2019 upon evaluation today patient appears to be doing really about the same as when he was previously seen here in the wound care center. He most recently was a patient of [place] back when she was still working here in the center but had been referred to [facility] for consideration of a flap. With that being said the surgeon there at [facility] stated that this was too large for her flap and they have been attempting to get this smaller in order to be able to proceed with a flap. Nonetheless unfortunately he has had a cycle of going back and forth between the osteomyelitis flaring and then sent him back and then making a little bit of progress only to be sent back again. It sounds like most recently they have been using a Iodoflex type dressing at this point which does not seem to have done any harm by any means. With that being said this wound seems to be quite large for using Iodoflex throughout and subsequently I think he may do much better with the use of Vioxx moistened gauze which would be safe for the new tissue growing and also keep the wound quite nice and clean. The patient is not opposed to this he in fact states that his home health nurse had mentioned this as a possibility as well. 07/23/2019 upon evaluation today patient actually appears to be doing very well in regard to his  sacral wound. In fact this is much healthier the measurements not terribly different but again with a wound like this at home necessarily expect a a lot of change as far as the overall measurements are concerned in just 1 week's time. This is going be a much longer term process at this point. With that being said I do think that he is very healthy appearing as far as the base of the wound is concerned. 08/06/2019 upon evaluation today patient actually appears to be doing about the same with regard to his wound to be honest. There is really not a significant improvement overall based on what I am seeing today. Fortunately there is no signs of significant systemic infection. No fevers, chills, nausea, vomiting, or diarrhea. With that being said there is odor to the drainage from the wound and subsequently also what appears to be increased drainage based on what we are seeing today as well as what his home health nurse called [place] about that she was concerned with as well. 08/13/2019 upon evaluation today patient appears to be doing about the same at this point with regard to his wound although I think the dressing may be a little less drainage wise compared to what it was previous. Fortunately there is no signs of active infection at this time. No fevers, chills, nausea, vomiting, or diarrhea. He has been taking the antibiotic for only 3 days. 08/23/2019 upon evaluation today patient appears to be doing not nearly as well  with regard to his wound. In general really not making a lot of progress which is unfortunate. There does not appear to be any signs of active infection at this time. No fevers, chills, nausea, vomiting, or diarrhea. With that being said the patient unfortunately does seem to be experiencing continued epiboly around the edges of the wound as well as significant scar tissue he has been in the majority of his day sitting in his chair which is likely a big reason for all of this. Fortunately  there is no signs of active infection at this time. No fevers, chills, nausea, vomiting, or diarrhea. 08/29/2019 upon evaluation today patient's wound actually appears to be showing some signs of improvement. The region of few areas of new skin growth around the edges of the wound even where he has some of the epiboly. This is actually good news and I think that he has been very aggressive and offloading over the past week since we showed him the pictures of his wound last week. There still may be a chance that he is Bradley [name], Bradley [name]. ([id number]) going require some sharp debridement to clear away some of these edges but to be honest I think that is can be quite an undertaking. The main reason is that he has a lot of thickened scar tissue and it is good to bleed quite significantly in my opinion. The I think if organ to do that we may even need to have a portable or disposable cautery in order to make sure that we are able to get the area completely sealed up as far as bleeding is concerned. I do have one that we can utilize. However being that the wound looks so much better right now would like to give this 2 weeks to see how things stand and look at that point before making any additional recommendations. 09/12/2019 upon evaluation today patient appears to be making some progress as far as offloading is concerned. He still has a substantial wound but nonetheless he tells me he is off loading much more aggressively at this point. This is obviously good news. No fevers, chills, nausea, vomiting, or diarrhea. 4/22; I have not seen this patient in quite some time. No major change in the condition of the wound or its wound volume. Surrounding maceration of the skin around the edges of the wound. The wound is fairly substantial. To close to the anal opening to consider a wound VAC. He had extensive trials of plastic surgery at [facility] which really does not result in any improvement. His wound bed is  however clean 10/10/2019 upon evaluation today patient appears to be doing well with regard to the wound in the sacral region. He has been tolerating the dressing changes without complication. Fortunately there is no signs of infection and he actually has some epithelial growth noted as well. He tells me has been trying to continue to offload this area which is excellent that is what he needs to do most as far as what he can have in his control. Otherwise I feel like the collagen with a saline moistened gauze behind has been beneficial for him. 5/20; collagen and normal saline wet-to-dry. Although the tissue when this large sacral wound looks reasonable there is been absolutely no benefit to the overall closure. He has macerated skin around this. He tells me that he spends a large part of the day up in the wheelchair. He still has home health Objective Constitutional Sitting or standing Blood  Pressure is within target range for patient.. Pulse regular and within target range for patient.Marland Kitchen Respirations regular, non- labored and within target range.. Temperature is normal and within the target range for the patient.. The patient does not look to be systemically unwell. Vitals Time Taken: 10:53 AM, Temperature: 98.0 F, Pulse: 78 bpm, Respiratory Rate: 16 breaths/min, Blood Pressure: 122/65 mmHg. General Notes: Wound exam; the patient has a clean granulated wound wound bed but this has substantial depth in the center. I do not think there is much difference from when I saw this 3 weeks ago. He has macerated edges around this. There is no evidence of infection Integumentary (Hair, Skin) Wound #10 status is Open. Original cause of wound was Pressure Injury. The wound is located on the Midline Sacrum. The wound measures 12cm length x 7.4cm width x 4.6cm depth; 69.743cm^2 area and 320.819cm^3 volume. There is Fat Layer (Subcutaneous Tissue) Exposed exposed. There is no tunneling or undermining noted.  There is a medium amount of serosanguineous drainage noted. Foul odor after cleansing was noted. The wound margin is epibole. There is large (67-100%) red granulation within the wound bed. There is a small (1-33%) amount of necrotic tissue within the wound bed including Adherent Slough. Wound #11 status is Open. Original cause of wound was Trauma. The wound is located on the Left Gluteal fold. The wound measures 3.4cm length x 1.1cm width x 0.1cm depth; 2.937cm^2 area and 0.294cm^3 volume. There is Fat Layer (Subcutaneous Tissue) Exposed exposed. There is no tunneling or undermining noted. There is a medium amount of serous drainage noted. The wound margin is flat and intact. There is large (67-100%) pink granulation within the wound bed. There is a small (1-33%) amount of necrotic tissue within the wound bed including Adherent Slough. Assessment Active Problems ICD-10 Pressure ulcer of sacral region, stage 4 Other chronic osteomyelitis, other site Paraplegia, unspecified Plan MICO, KNEIFL A. ([id number]) Wound Cleansing: Wound #10 Midline Sacrum: Clean wound with Normal Saline. Dial antibacterial soap, wash wounds, rinse and pat dry prior to dressing wounds Wound #11 Left Gluteal fold: Clean wound with Normal Saline. Dial antibacterial soap, wash wounds, rinse and pat dry prior to dressing wounds Primary Wound Dressing: Wound #10 Midline Sacrum: Collagen - in center base of wound moistened with saline Plain packing gauze - wet to dry to fill in space just on midline Wound #11 Left Gluteal fold: Collagen - in center base of wound moistened with saline Plain packing gauze - wet to dry to fill in space just on midline Secondary Dressing: Wound #10 Midline Sacrum: ABD pad XtraSorb - in office only Other - Carboflex 15cmx20cm Wound #11 Left Gluteal fold: Boardered Foam Dressing Dressing Change Frequency: Wound #10 Midline Sacrum: Change dressing every day. - Patient's son to change  when [facility] does not visit Other: - [place] physical therapist to evaluate patient for new Roho cushion Wound #11 Left Gluteal fold: Change dressing every day. - Patient's son to change when [facility] does not visit Other: - [place] physical therapist to evaluate patient for new Roho cushion Follow-up Appointments: Wound #10 Midline Sacrum: Return Appointment in 2 weeks. Wound #11 Left Gluteal fold: Return Appointment in 2 weeks. Off-Loading: Wound #10 Midline Sacrum: Turn and reposition every 2 hours Wound #11 Left Gluteal fold: Turn and reposition every 2 hours Home Health: Wound #10 Midline Sacrum: [place] Nurse may visit PRN to address patient s wound care needs. FACE TO FACE ENCOUNTER: MEDICARE and MEDICAID PATIENTS: I  certify that this patient is under my care and that I had a face-to-face encounter that meets the physician face-to-face encounter requirements with this patient on this date. The encounter with the patient was in whole or in part for the following MEDICAL CONDITION: (primary reason for [place]) MEDICAL NECESSITY: I certify, that based on my findings, NURSING services are a medically necessary home health service. HOME BOUND STATUS: I certify that my clinical findings support that this patient is homebound (i.e., Due to illness or injury, pt requires aid of supportive devices such as crutches, cane, wheelchairs, walkers, the use of special transportation or the assistance of another person to leave their place of residence. There is a normal inability to leave the home and doing so requires considerable and taxing effort. Other absences are for medical reasons / religious services and are infrequent or of short duration when for other reasons). If current dressing causes regression in wound condition, may D/C ordered dressing product/s and apply Normal Saline Moist Dressing daily until next [place] / Other MD appointment. [place] of regression in wound condition at [phone]. Please direct any NON-WOUND related issues/requests for orders to patient's Primary Care Physician Wound #11 Left Gluteal fold: [place] Nurse may visit PRN to address patient s wound care needs. FACE TO FACE ENCOUNTER: MEDICARE and MEDICAID PATIENTS: I certify that this patient is under my care and that I had a face-to-face encounter that meets the physician face-to-face encounter requirements with this patient on this date. The encounter with the patient was in whole or in part for the following MEDICAL CONDITION: (primary reason for [place]) MEDICAL NECESSITY: I certify, that based on my findings, NURSING services are a medically necessary home health service. HOME BOUND STATUS: I certify that my clinical findings support that this patient is homebound (i.e., Due to illness or injury, pt requires aid of supportive devices such as crutches, cane, wheelchairs, walkers, the use of special transportation or the assistance of another person to leave their place of residence. There is a normal inability to leave the home and doing so requires considerable and taxing effort. Other absences are for medical reasons / religious services and are infrequent or of short duration when for other reasons). If current dressing causes regression in wound condition, may D/C ordered dressing product/s and apply Normal Saline Moist Dressing daily until next [place] / Other MD appointment. [place] of regression in wound condition at [phone]. Please direct any NON-WOUND related issues/requests for orders to patient's Primary Care Physician I did not change The dressing which is baseline silver collagen back with normal saline wet-to-dry 2. I did talk to the patient about more vigorous offloading. It does not appear that he makes any attempt to stay off this area. 3. If  there is no measurable improvement in a month I would just go back to hydrogel wet-to-dry 4. He still has home health changing dressings here although I am glad to hear this I find it hard to understand Electronic Signature(s) Bradley [name], Bradley [name] ([id number]) Signed: 10/24/2019 5:46:06 PM By: Linton Ham MD Entered By: Linton Ham on 10/24/2019 12:34:52 Bradley [name] ([id number]) -------------------------------------------------------------------------------- [place] Details Patient Name: Bradley Loa A. Date of Service: 10/24/2019 Medical Record Number: [id number] Patient Account Number: [id number] Date of Birth/Sex: 11-26-1945 (74 y.o. M) Treating RN: Army Melia Primary Care Provider: [name] Other Clinician: Referring Provider: [name] Treating Provider/Extender: Ricard Dillon  Weeks in Treatment: 14 Diagnosis Coding ICD-10 Codes Code Description L89.154 Pressure ulcer of sacral region, stage 4 M86.68 Other chronic osteomyelitis, other site G82.20 Paraplegia, unspecified Physician Procedures CPT4 Code: [id number] Description: [id number] - WC PHYS LEVEL 3 - EST PT Modifier: Quantity: 1 CPT4 Code: Description: ICD-10 Diagnosis Description L89.154 Pressure ulcer of sacral region, stage 4 Modifier: Quantity: Electronic Signature(s) Signed: 10/24/2019 5:46:06 PM By: Linton Ham MD Entered By: Linton Ham on 10/24/2019 12:35:12

## 2019-10-28 DIAGNOSIS — N319 Neuromuscular dysfunction of bladder, unspecified: Secondary | ICD-10-CM | POA: Diagnosis not present

## 2019-10-28 DIAGNOSIS — J309 Allergic rhinitis, unspecified: Secondary | ICD-10-CM | POA: Diagnosis not present

## 2019-10-28 DIAGNOSIS — S24104S Unspecified injury at T11-T12 level of thoracic spinal cord, sequela: Secondary | ICD-10-CM | POA: Diagnosis not present

## 2019-10-28 DIAGNOSIS — G8220 Paraplegia, unspecified: Secondary | ICD-10-CM | POA: Diagnosis not present

## 2019-10-28 DIAGNOSIS — Z933 Colostomy status: Secondary | ICD-10-CM | POA: Diagnosis not present

## 2019-10-28 DIAGNOSIS — L89154 Pressure ulcer of sacral region, stage 4: Secondary | ICD-10-CM | POA: Diagnosis not present

## 2019-10-28 DIAGNOSIS — I10 Essential (primary) hypertension: Secondary | ICD-10-CM | POA: Diagnosis not present

## 2019-10-28 DIAGNOSIS — D509 Iron deficiency anemia, unspecified: Secondary | ICD-10-CM | POA: Diagnosis not present

## 2019-10-28 DIAGNOSIS — N400 Enlarged prostate without lower urinary tract symptoms: Secondary | ICD-10-CM | POA: Diagnosis not present

## 2019-10-30 DIAGNOSIS — D509 Iron deficiency anemia, unspecified: Secondary | ICD-10-CM | POA: Diagnosis not present

## 2019-10-30 DIAGNOSIS — J309 Allergic rhinitis, unspecified: Secondary | ICD-10-CM | POA: Diagnosis not present

## 2019-10-30 DIAGNOSIS — N400 Enlarged prostate without lower urinary tract symptoms: Secondary | ICD-10-CM | POA: Diagnosis not present

## 2019-10-30 DIAGNOSIS — I10 Essential (primary) hypertension: Secondary | ICD-10-CM | POA: Diagnosis not present

## 2019-10-30 DIAGNOSIS — L89154 Pressure ulcer of sacral region, stage 4: Secondary | ICD-10-CM | POA: Diagnosis not present

## 2019-10-30 DIAGNOSIS — Z933 Colostomy status: Secondary | ICD-10-CM | POA: Diagnosis not present

## 2019-10-30 DIAGNOSIS — G8220 Paraplegia, unspecified: Secondary | ICD-10-CM | POA: Diagnosis not present

## 2019-10-30 DIAGNOSIS — S24104S Unspecified injury at T11-T12 level of thoracic spinal cord, sequela: Secondary | ICD-10-CM | POA: Diagnosis not present

## 2019-10-30 DIAGNOSIS — N319 Neuromuscular dysfunction of bladder, unspecified: Secondary | ICD-10-CM | POA: Diagnosis not present

## 2019-10-31 DIAGNOSIS — L89154 Pressure ulcer of sacral region, stage 4: Secondary | ICD-10-CM | POA: Diagnosis not present

## 2019-10-31 DIAGNOSIS — M869 Osteomyelitis, unspecified: Secondary | ICD-10-CM | POA: Diagnosis not present

## 2019-10-31 DIAGNOSIS — G8220 Paraplegia, unspecified: Secondary | ICD-10-CM | POA: Diagnosis not present

## 2019-11-01 DIAGNOSIS — N319 Neuromuscular dysfunction of bladder, unspecified: Secondary | ICD-10-CM | POA: Diagnosis not present

## 2019-11-01 DIAGNOSIS — J309 Allergic rhinitis, unspecified: Secondary | ICD-10-CM | POA: Diagnosis not present

## 2019-11-01 DIAGNOSIS — L89154 Pressure ulcer of sacral region, stage 4: Secondary | ICD-10-CM | POA: Diagnosis not present

## 2019-11-01 DIAGNOSIS — D509 Iron deficiency anemia, unspecified: Secondary | ICD-10-CM | POA: Diagnosis not present

## 2019-11-01 DIAGNOSIS — N400 Enlarged prostate without lower urinary tract symptoms: Secondary | ICD-10-CM | POA: Diagnosis not present

## 2019-11-01 DIAGNOSIS — Z933 Colostomy status: Secondary | ICD-10-CM | POA: Diagnosis not present

## 2019-11-01 DIAGNOSIS — S24104S Unspecified injury at T11-T12 level of thoracic spinal cord, sequela: Secondary | ICD-10-CM | POA: Diagnosis not present

## 2019-11-01 DIAGNOSIS — G8220 Paraplegia, unspecified: Secondary | ICD-10-CM | POA: Diagnosis not present

## 2019-11-01 DIAGNOSIS — I10 Essential (primary) hypertension: Secondary | ICD-10-CM | POA: Diagnosis not present

## 2019-11-04 DIAGNOSIS — N319 Neuromuscular dysfunction of bladder, unspecified: Secondary | ICD-10-CM | POA: Diagnosis not present

## 2019-11-04 DIAGNOSIS — I10 Essential (primary) hypertension: Secondary | ICD-10-CM | POA: Diagnosis not present

## 2019-11-04 DIAGNOSIS — N400 Enlarged prostate without lower urinary tract symptoms: Secondary | ICD-10-CM | POA: Diagnosis not present

## 2019-11-04 DIAGNOSIS — J309 Allergic rhinitis, unspecified: Secondary | ICD-10-CM | POA: Diagnosis not present

## 2019-11-04 DIAGNOSIS — L89154 Pressure ulcer of sacral region, stage 4: Secondary | ICD-10-CM | POA: Diagnosis not present

## 2019-11-04 DIAGNOSIS — Z933 Colostomy status: Secondary | ICD-10-CM | POA: Diagnosis not present

## 2019-11-04 DIAGNOSIS — S24104S Unspecified injury at T11-T12 level of thoracic spinal cord, sequela: Secondary | ICD-10-CM | POA: Diagnosis not present

## 2019-11-04 DIAGNOSIS — G8220 Paraplegia, unspecified: Secondary | ICD-10-CM | POA: Diagnosis not present

## 2019-11-04 DIAGNOSIS — D509 Iron deficiency anemia, unspecified: Secondary | ICD-10-CM | POA: Diagnosis not present

## 2019-11-06 DIAGNOSIS — I10 Essential (primary) hypertension: Secondary | ICD-10-CM | POA: Diagnosis not present

## 2019-11-06 DIAGNOSIS — J309 Allergic rhinitis, unspecified: Secondary | ICD-10-CM | POA: Diagnosis not present

## 2019-11-06 DIAGNOSIS — Z933 Colostomy status: Secondary | ICD-10-CM | POA: Diagnosis not present

## 2019-11-06 DIAGNOSIS — N319 Neuromuscular dysfunction of bladder, unspecified: Secondary | ICD-10-CM | POA: Diagnosis not present

## 2019-11-06 DIAGNOSIS — G8220 Paraplegia, unspecified: Secondary | ICD-10-CM | POA: Diagnosis not present

## 2019-11-06 DIAGNOSIS — D509 Iron deficiency anemia, unspecified: Secondary | ICD-10-CM | POA: Diagnosis not present

## 2019-11-06 DIAGNOSIS — L89154 Pressure ulcer of sacral region, stage 4: Secondary | ICD-10-CM | POA: Diagnosis not present

## 2019-11-06 DIAGNOSIS — N400 Enlarged prostate without lower urinary tract symptoms: Secondary | ICD-10-CM | POA: Diagnosis not present

## 2019-11-06 DIAGNOSIS — S24104S Unspecified injury at T11-T12 level of thoracic spinal cord, sequela: Secondary | ICD-10-CM | POA: Diagnosis not present

## 2019-11-07 ENCOUNTER — Other Ambulatory Visit: Payer: Self-pay

## 2019-11-07 ENCOUNTER — Encounter: Payer: Medicare HMO | Attending: Physician Assistant | Admitting: Physician Assistant

## 2019-11-07 DIAGNOSIS — G8220 Paraplegia, unspecified: Secondary | ICD-10-CM | POA: Diagnosis not present

## 2019-11-07 DIAGNOSIS — M8668 Other chronic osteomyelitis, other site: Secondary | ICD-10-CM | POA: Insufficient documentation

## 2019-11-07 DIAGNOSIS — L89154 Pressure ulcer of sacral region, stage 4: Secondary | ICD-10-CM | POA: Insufficient documentation

## 2019-11-07 NOTE — Progress Notes (Signed)
BADR, Bradley [name] ([id number]) Visit Report for 11/07/2019 Arrival Information Details Patient Name: Bradley [name], Bradley [name]. Date of Service: 11/07/2019 2:45 PM Medical Record Number: [id number] Patient Account Number: [id number] Date of Birth/Sex: July 07, 1945 (74 y.o. M) Treating RN: Army Melia Primary Care Yechiel Erny: [name] Other Clinician: Referring Kajah Santizo: [name] Treating Beckham Buxbaum/Extender: [name], HOYT Weeks in Treatment: 18 Visit Information History Since Last Visit Added or deleted any medications: No Patient Arrived: Wheel Chair Any new allergies or adverse reactions: No Arrival Time: 14:32 Had a fall or experienced change in No Accompanied By: self activities of daily living that may affect Transfer Assistance: Harrel Lemon Lift risk of falls: Signs or symptoms of abuse/neglect since last visito No Hospitalized since last visit: No Implantable device outside of the clinic excluding No cellular tissue based products placed in the center since last visit: Has Dressing in Place as Prescribed: Yes Pain Present Now: No Electronic Signature(s) Signed: 11/07/2019 4:08:09 PM By: Lorine Bears RCP, RRT, CHT Entered By: Lorine Bears on 11/07/2019 14:33:42 [name] -------------------------------------------------------------------------------- Clinic Level of Care Assessment Details Patient Name: Bradley Loa A. Date of Service: 11/07/2019 2:45 PM Medical Record Number: [id number] Patient Account Number: [id number] Date of Birth/Sex: 01-Sep-1945 (74 y.o. M) Treating RN: Army Melia Primary Care Siddhanth Denk: [name] Other Clinician: Referring Susette Seminara: [name] Treating Jerzy Roepke/Extender: [name], HOYT Weeks in Treatment: 16 Clinic Level of Care Assessment Items TOOL 4 Quantity Score []  - Use when only an EandM is performed on FOLLOW-UP visit 0 ASSESSMENTS - Nursing Assessment / Reassessment X -  Reassessment of Co-morbidities (includes updates in patient status) 1 10 X- 1 5 Reassessment of Adherence to Treatment Plan ASSESSMENTS - Wound and Skin Assessment / Reassessment X - Simple Wound Assessment / Reassessment - one wound 1 5 []  - 0 Complex Wound Assessment / Reassessment - multiple wounds []  - 0 Dermatologic / Skin Assessment (not related to wound area) ASSESSMENTS - Focused Assessment []  - Circumferential Edema Measurements - multi extremities 0 []  - 0 Nutritional Assessment / Counseling / Intervention []  - 0 Lower Extremity Assessment (monofilament, tuning fork, pulses) []  - 0 Peripheral Arterial Disease Assessment (using hand held doppler) ASSESSMENTS - Ostomy and/or Continence Assessment and Care []  - Incontinence Assessment and Management 0 []  - 0 Ostomy Care Assessment and Management (repouching, etc.) PROCESS - Coordination of Care X - Simple Patient / Family Education for ongoing care 1 15 []  - 0 Complex (extensive) Patient / Family Education for ongoing care []  - 0 Staff obtains Programmer, systems, Records, Test Results / Process Orders []  - 0 Staff telephones HHA, Nursing Homes / Clarify orders / etc []  - 0 Routine Transfer to another Facility (non-emergent condition) []  - 0 Routine Hospital Admission (non-emergent condition) []  - 0 New Admissions / Biomedical engineer / Ordering NPWT, Apligraf, etc. []  - 0 Emergency Hospital Admission (emergent condition) X- 1 10 Simple Discharge Coordination []  - 0 Complex (extensive) Discharge Coordination PROCESS - Special Needs []  - Pediatric / Minor Patient Management 0 []  - 0 Isolation Patient Management []  - 0 Hearing / Language / Visual special needs []  - 0 Assessment of Community assistance (transportation, D/C planning, etc.) []  - 0 Additional assistance / Altered mentation []  - 0 Support Surface(s) Assessment (bed, cushion, seat, etc.) INTERVENTIONS - Wound Cleansing / Measurement JAGER, LOCKETT A.  ([id number]) X- 1 5 Simple Wound Cleansing - one wound []  - 0 Complex Wound Cleansing - multiple wounds X- 1 5 Wound Imaging (photographs - any  number of wounds) []  - 0 Wound Tracing (instead of photographs) X- 1 5 Simple Wound Measurement - one wound []  - 0 Complex Wound Measurement - multiple wounds INTERVENTIONS - Wound Dressings []  - Small Wound Dressing one or multiple wounds 0 X- 1 15 Medium Wound Dressing one or multiple wounds []  - 0 Large Wound Dressing one or multiple wounds []  - 0 Application of Medications - topical []  - 0 Application of Medications - injection INTERVENTIONS - Miscellaneous []  - External ear exam 0 []  - 0 Specimen Collection (cultures, biopsies, blood, body fluids, etc.) []  - 0 Specimen(s) / Culture(s) sent or taken to Lab for analysis []  - 0 Patient Transfer (multiple staff / Civil Service fast streamer / Similar devices) []  - 0 Simple Staple / Suture removal (25 or less) []  - 0 Complex Staple / Suture removal (26 or more) []  - 0 Hypo / Hyperglycemic Management (close monitor of Blood Glucose) []  - 0 Ankle / Brachial Index (ABI) - do not check if billed separately X- 1 5 Vital Signs Has the patient been seen at the hospital within the last three years: Yes Total Score: 80 Level Of Care: New/Established - Level 3 Electronic Signature(s) Signed: 11/07/2019 3:48:05 PM By: Army Melia Entered By: Army Melia on 11/07/2019 14:50:55 [name] -------------------------------------------------------------------------------- Encounter Discharge Information Details Patient Name: Bradley Loa A. Date of Service: 11/07/2019 2:45 PM Medical Record Number: [id number] Patient Account Number: [id number] Date of Birth/Sex: 02-14-1946 (74 y.o. M) Treating RN: Army Melia Primary Care Bradley [name]: [name] Other Clinician: Referring Bradley [name]: [name] Treating Bradley [name]/Extender: [name], HOYT Weeks in Treatment: 16 Encounter  Discharge Information Items Discharge Condition: Stable Ambulatory Status: Wheelchair Discharge Destination: Home Transportation: Private Auto Accompanied By: self Schedule Follow-up Appointment: Yes Clinical Summary of Care: Electronic Signature(s) Signed: 11/07/2019 3:48:05 PM By: Army Melia Entered By: Army Melia on 11/07/2019 14:51:53 [name] -------------------------------------------------------------------------------- Lower Extremity Assessment Details Patient Name: Bradley Loa A. Date of Service: 11/07/2019 2:45 PM Medical Record Number: [id number] Patient Account Number: [id number] Date of Birth/Sex: 1946-02-08 (74 y.o. M) Treating RN: Army Melia Primary Care Cochise Dinneen: [name] Other Clinician: Referring Etana Beets: [name] Treating Spenser Harren/Extender: [name], HOYT Weeks in Treatment: 16 Electronic Signature(s) Signed: 11/07/2019 3:48:05 PM By: Army Melia Entered By: Army Melia on 11/07/2019 14:44:19 [name] -------------------------------------------------------------------------------- Multi Wound Chart Details Patient Name: Bradley Loa A. Date of Service: 11/07/2019 2:45 PM Medical Record Number: [id number] Patient Account Number: [id number] Date of Birth/Sex: 1945-10-15 (74 y.o. M) Treating RN: Army Melia Primary Care Plez Belton: [name] Other Clinician: Referring Jerol Rufener: [name] Treating Janmarie Smoot/Extender: [name], HOYT Weeks in Treatment: 16 Vital Signs Height(in): Pulse(bpm): 91 Weight(lbs): Blood Pressure(mmHg): 115/66 Body Mass Index(BMI): Temperature(F): 98.1 Respiratory Rate(breaths/min): 16 Photos: [N/A:N/A] Wound Location: Midline Sacrum Left Gluteal fold N/A Wounding Event: Pressure Injury Trauma N/A Primary Etiology: Pressure Ulcer Skin Tear N/A Comorbid History: Anemia, Hypertension, History of Anemia, Hypertension, History of N/A pressure wounds,  Rheumatoid pressure wounds, Rheumatoid Arthritis, Paraplegia Arthritis, Paraplegia Date Acquired: 06/07/2015 10/21/2019 N/A Weeks of Treatment: 16 2 N/A Wound Status: Open Open N/A Measurements L x W x D (cm) 6x7.5x4.5 0.5x0.5x0.1 N/A Area (cm) : 35.343 0.196 N/A Volume (cm) : 159.043 0.02 N/A % Reduction in Area: 31.40% 93.30% N/A % Reduction in Volume: 48.60% 93.20% N/A Classification: Category/Stage IV Full Thickness Without Exposed N/A Support Structures Exudate Amount: Medium Medium N/A Exudate Type: Serosanguineous Serous N/A Exudate Color: red, brown amber N/A Foul Odor After Cleansing:  Yes No N/A Odor Anticipated Due to Product No N/A N/A Use: Wound Margin: Epibole Flat and Intact N/A Granulation Amount: Large (67-100%) Large (67-100%) N/A Granulation Quality: Red Pink N/A Necrotic Amount: Small (1-33%) Small (1-33%) N/A Exposed Structures: Fat Layer (Subcutaneous Tissue) Fat Layer (Subcutaneous Tissue) N/A Exposed: Yes Exposed: Yes Fascia: No Fascia: No Tendon: No Tendon: No Muscle: No Muscle: No Joint: No Joint: No Bone: No Bone: No Epithelialization: None Small (1-33%) N/A Treatment Notes Electronic Signature(s) Signed: 11/07/2019 3:48:05 PM By: Jerry Caras ([id number]) Entered By: Army Melia on 11/07/2019 14:47:45 [name] -------------------------------------------------------------------------------- [place] Details Patient Name: Bradley Loa A. Date of Service: 11/07/2019 2:45 PM Medical Record Number: [id number] Patient Account Number: [id number] Date of Birth/Sex: 07-04-1945 (74 y.o. M) Treating RN: Army Melia Primary Care Donica Derouin: [name] Other Clinician: Referring Arlando Leisinger: [name] Treating Shloime Keilman/Extender: [name], HOYT Weeks in Treatment: 16 Active Inactive Abuse / Safety / Falls / Self Care Management Nursing Diagnoses: Impaired physical  mobility Goals: Patient will not develop complications from immobility Date Initiated: 07/16/2019 Target Resolution Date: 10/12/2019 Goal Status: Active Interventions: Assess Activities of Daily Living upon admission and as needed Notes: Orientation to the Wound Care Program Nursing Diagnoses: Knowledge deficit related to the wound healing center program Goals: Patient/caregiver will verbalize understanding of the [place] Program Date Initiated: 07/16/2019 Target Resolution Date: 10/12/2019 Goal Status: Active Interventions: Provide education on orientation to the wound center Notes: Pressure Nursing Diagnoses: Knowledge deficit related to causes and risk factors for pressure ulcer development Goals: Patient will remain free from development of additional pressure ulcers Date Initiated: 07/16/2019 Target Resolution Date: 10/12/2019 Goal Status: Active Interventions: Provide education on pressure ulcers Notes: Wound/Skin Impairment Nursing Diagnoses: Impaired tissue integrity Goals: Ulcer/skin breakdown will heal within 14 weeks Date Initiated: 07/16/2019 Target Resolution Date: 10/12/2019 Goal Status: Active DAMARQUES, SORKIN ([id number]) Interventions: Assess patient/caregiver ability to obtain necessary supplies Assess patient/caregiver ability to perform ulcer/skin care regimen upon admission and as needed Assess ulceration(s) every visit Notes: Electronic Signature(s) Signed: 11/07/2019 3:48:05 PM By: Army Melia Entered By: Army Melia on 11/07/2019 14:47:32 [name] -------------------------------------------------------------------------------- Pain Assessment Details Patient Name: Bradley Loa A. Date of Service: 11/07/2019 2:45 PM Medical Record Number: [id number] Patient Account Number: [id number] Date of Birth/Sex: [birth year] (74 y.o. M) Treating RN: Army Melia Primary Care Harlen Danford: [name] Other Clinician: Referring  Erricka Falkner: [name] Treating Lilburn Straw/Extender: [name], HOYT Weeks in Treatment: 16 Active Problems Location of Pain Severity and Description of Pain Patient Has Paino No Site Locations Pain Management and Medication Current Pain Management: Electronic Signature(s) Signed: 11/07/2019 3:48:05 PM By: Army Melia Entered By: Army Melia on 11/07/2019 14:42:14 [name] -------------------------------------------------------------------------------- Patient/Caregiver Education Details Patient Name: Bradley Loa A. Date of Service: 11/07/2019 2:45 PM Medical Record Number: [id number] Patient Account Number: [id number] Date of Birth/Gender: 1946-04-30 (74 y.o. M) Treating RN: Army Melia Primary Care Physician: [name] Other Clinician: Referring Physician: [name] Treating Physician/Extender: Sharalyn Ink in Treatment: 16 Education Assessment Education Provided To: Patient Education Topics Provided Wound/Skin Impairment: Handouts: Caring for Your Ulcer Methods: Demonstration, Explain/Verbal Responses: State content correctly Electronic Signature(s) Signed: 11/07/2019 3:48:05 PM By: Army Melia Entered By: Army Melia on 11/07/2019 14:51:10 [name] -------------------------------------------------------------------------------- Wound Assessment Details Patient Name: Bradley Loa A. Date of Service: 11/07/2019 2:45 PM Medical Record Number: [id number] Patient Account Number: [id number] Date of Birth/Sex: [birth year] (74 y.o. M) Treating RN: Nicki Reaper,  [place] Primary Care Frimy Uffelman: [name] Other Clinician: Referring Eliazer Hemphill: [name] Treating Jashan Cotten/Extender: [name], HOYT Weeks in Treatment: 16 Wound Status Wound Number: 10 Primary Pressure Ulcer Etiology: Wound Location: Midline Sacrum Wound Open Wounding Event: Pressure Injury Status: Date Acquired: 06/07/2015 Comorbid  Anemia, Hypertension, History of pressure wounds, Weeks Of Treatment: 16 History: Rheumatoid Arthritis, Paraplegia Clustered Wound: No Photos Wound Measurements Length: (cm) 6 % Reduct Width: (cm) 7.5 % Reduct Depth: (cm) 4.5 Epitheli Area: (cm) 35.343 Volume: (cm) 159.043 ion in Area: 31.4% ion in Volume: 48.6% alization: None Wound Description Classification: Category/Stage IV Foul Od Wound Margin: Epibole Due to Exudate Amount: Medium Slough/ Exudate Type: Serosanguineous Exudate Color: red, brown or After Cleansing: Yes Product Use: No Fibrino Yes Wound Bed Granulation Amount: Large (67-100%) Exposed Structure Granulation Quality: Red Fascia Exposed: No Necrotic Amount: Small (1-33%) Fat Layer (Subcutaneous Tissue) Exposed: Yes Necrotic Quality: Adherent Slough Tendon Exposed: No Muscle Exposed: No Joint Exposed: No Bone Exposed: No Treatment Notes Wound #10 (Midline Sacrum) Notes prisma, wet to dry, ABd, xsorb Electronic Signature(s) Signed: 11/07/2019 3:48:05 PM By: Jerry Caras ([id number]) Entered By: Army Melia on 11/07/2019 14:43:46 [name] -------------------------------------------------------------------------------- Wound Assessment Details Patient Name: Bradley Loa A. Date of Service: 11/07/2019 2:45 PM Medical Record Number: [id number] Patient Account Number: [id number] Date of Birth/Sex: Jun 28, 1945 (74 y.o. M) Treating RN: Army Melia Primary Care Jadyn Brasher: [name] Other Clinician: Referring Naesha Buckalew: [name] Treating Gerrick Ray/Extender: [name], HOYT Weeks in Treatment: 16 Wound Status Wound Number: 11 Primary Skin Tear Etiology: Wound Location: Left Gluteal fold Wound Open Wounding Event: Trauma Status: Date Acquired: 10/21/2019 Comorbid Anemia, Hypertension, History of pressure wounds, Weeks Of Treatment: 2 History: Rheumatoid Arthritis, Paraplegia Clustered Wound:  No Photos Wound Measurements Length: (cm) 0.5 Width: (cm) 0.5 Depth: (cm) 0.1 Area: (cm) 0.196 Volume: (cm) 0.02 % Reduction in Area: 93.3% % Reduction in Volume: 93.2% Epithelialization: Small (1-33%) Wound Description Classification: Full Thickness Without Exposed Support Structu Wound Margin: Flat and Intact Exudate Amount: Medium Exudate Type: Serous Exudate Color: amber res Foul Odor After Cleansing: No Slough/Fibrino Yes Wound Bed Granulation Amount: Large (67-100%) Exposed Structure Granulation Quality: Pink Fascia Exposed: No Necrotic Amount: Small (1-33%) Fat Layer (Subcutaneous Tissue) Exposed: Yes Necrotic Quality: Adherent Slough Tendon Exposed: No Muscle Exposed: No Joint Exposed: No Bone Exposed: No Treatment Notes Wound #11 (Left Gluteal fold) Notes prisma, wet to dry, ABd, xsorb Electronic Signature(s) Signed: 11/07/2019 3:48:05 PM By: Jerry Caras ([id number]) Entered By: Army Melia on 11/07/2019 14:44:06 [name] -------------------------------------------------------------------------------- Vitals Details Patient Name: Bradley Loa A. Date of Service: 11/07/2019 2:45 PM Medical Record Number: [id number] Patient Account Number: [id number] Date of Birth/Sex: 10-02-1945 (74 y.o. M) Treating RN: Army Melia Primary Care Odella Appelhans: [name] Other Clinician: Referring Brylea Pita: [name] Treating Herminio Kniskern/Extender: [name], HOYT Weeks in Treatment: 16 Vital Signs Time Taken: 02:41 Temperature (F): 98.1 Pulse (bpm): 79 Respiratory Rate (breaths/min): 16 Blood Pressure (mmHg): 115/66 Reference Range: 80 - 120 mg / dl Electronic Signature(s) Signed: 11/07/2019 4:08:09 PM By: Lorine Bears RCP, RRT, CHT Entered By: Lorine Bears on 11/07/2019 14:41:44

## 2019-11-07 NOTE — Progress Notes (Addendum)
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 his wound. He had been to see plastic surgery at [facility] at our request after his last visit on 09/21/16; we have been seeing him for several months with a large stage III wound. He had been sent to general surgery for consideration of a colostomy, that appointment was not until mid June He comes in today with a  temperature of 101. He is reporting an odor in the wound since last weekend. 01/10/17 Readmission: 01/10/17 On evaluation today it is noted that patient has been seen by plastic surgery at [facility] since he was last evaluated here. They did discuss with him the possibility of a flap according to the notes but unfortunately at this point he was not quite ready to proceed with surgery and instead wanted to give the Wound VAC a try. In the hospital they were able to get a good seal on the Wound VAC. Unfortunately since that time they have been having trouble in regard to his current home health company keeping a simple on the Wound VAC. He would like to switch to a different home health company. With that being said it sounds as if the problem is that his wound VAC is not feeling at the lower portion of his back and he tells me that he can take some of the clear plastic and put over that area when the sill breaks and it will correct it for time. He has no discomfort or pain which is good news. He has been treated with IV vancomycin since he was last seen here and has an appointment with a infectious disease specialist in two days on  01/12/17. Otherwise he was transferred back to [place] for continuing to monitor and manage is wound as she progresses with a Wound VAC for the time being. 01/17/17 on evaluation today patient continues to show evidence of slight improvement with the Wound VAC fortunately there's no evidence of infection or otherwise worsening condition in general. Nonetheless we were unable to get him switch to advanced homecare in regard to home help from his current company. I'm not sure the reasoning behind but for some reason he was not accepted as a patient with him. Continue to apply the Wound VAC which does still show that some maceration around the wound edges but the wound measurements were slightly improved. No fevers, chills, nausea, or vomiting noted at this time. 02/14/17; this patient I have  not seen in 5 months although he has been readmitted to our clinic seen by our physician assistant Jeri Cos twice in early August. I have looked through [facility] notes care everywhere. The patient saw plastic surgery in May [Dr. Bhatt}. The patient was sent to general surgery and ultimately had a colostomy placed. On 11/29/16. This was after he was admitted to [name] sometime in May. An MRI of the pelvis on 5/23 showed osteomyelitis of the coccyx. An attempt was made to drain fluid that was not successful. He was treated with empiric broad-spectrum antibiotics VAC/cefepime/Flagyl starting on 11/02/16 with plans for a 6 week course. According to their notes he was sent to a nursing home. Was last seen by Dr. Myriam Jacobson of plastic surgery on 12/28/16. The first part of the note is a long dissertation about the difficulties finding adequate patients for flap closure of pressure ulcers. At that time the wound was noted to be stage IV based I think on underlying infection no exposed bone and healthy granulation tissue. 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 He was last seen by infectious disease 01/12/17 A Dr. Uvaldo Rising. His note says that [name] was not interested in a flap closure for referring a trial of the wound VAC. As previously anticipated the wound VAC could not be maintained as an outpatient in the community. He is now using something similar to a Dakin's wet to dry recommended by [facility] VASHE solution. He is placing this twice a day himself. This is almost s hopeless setting in terms of heeling 02/28/17; he is using a Dakin's wet to dry. Most of the wound surface looks satisfactory however the deeper area over his coccyx now has exposed bone I'm not sure if I noted this last week. 03/21/17; patient is usingVASHE solution wet to dry which I gather is a variation on Dakin solution. He has home health changing this 3 times a week the other days he does this himself. His  appointment with plastic surgery 04/18/17; patient continues to use a variant of Dakin solution I believe. His wound continues to have a clean viable surface. The 2 areas of exposed bone in the center of this wound had closed over. He has an appointment with plastic surgery on December 5 at which time I hope that there'll be a plan for myocutaneous flap closure In looking through [place] link I couldn't find any more plastic surgery appointments. I did come across the fact that he is been followed by Bradley [name] ([id number]) hematology for a microcytic hypochromic anemia. He had a reasonably normal looking hemoglobin electrophoresis. His iron level was 10 and according to the patient he is going  for IV iron infusions starting tomorrow. He had a sedimentation rate of 74. More problematically from a pure wound care point of view his albumin was 2.7 earlier this month 05/17/17; this is a patient I follow monthly. He has a large now stage IV wound over his bilateral buttocks with close proximity to his anal opening. More recently he has developed a large area with exposed bone in the center of this probably secondary to the underlying osteomyelitis E had in the summer. He also follows with Dr. Myriam Jacobson at [facility] who is plastic surgery. He had an appointment earlier this month and according to the patient Dr. Myriam Jacobson does not want to proceed with any attempted flap closure. Although I do not have current access to her note in care everywhere this is likely due to exposed bone. Again according to the patient they did a bone biopsy. He is still using a variant of Dakin solution changing twice a day. He has home health. The patient is not able to give me a firm answer about how long he spends on this in his wheelchair The patient also states that Dr. Myriam Jacobson wanted to reconsider a wound VAC. I really don't see this as a viable option at least not in the outpatient setting. The wound itself is frankly to  close to his anal opening to maintain a seal. The last time we tried to do this home health was unable to manage it. It might be possible to maintain a wound VAC in this setting outside of the home such as a skilled nursing facility or an [place] however I am doubtful about this even in that setting **** READMISSION 09/21/17-He is here for evaluation of stage IV sacral ulcer. Since his last evaluation here in December he has completed treatment for sacral osteomyelitis. He was at [facility] for IV therapy and NPWT dressing changes. He was discharged, with home health services, in February. He admits that while in the skilled facility he had "80%" success with maintaining dressing, since discharge he has had approximately "40%" success with maintaining wound VAC dressing. We discussed at length that this is not a safe or recommended option. We will apply Dakin's wet to dry dressing daily and he will follow-up next week. He is accompanied today by his sister who is willing to assist in dressing changes; they will discuss the social issue as he feels he is capable of changing dressing daily when home health is not able. 09/28/17-He is here in follow-up evaluation for stage IV sacral pressure ulcer. He has been using the Dakin's wet-to-dry daily; he continues with home health. He is not accompanied by anyone at this visit. He will follow up in two weeks per his request/preference. 10/12/17 on evaluation today patient appears to be doing very well. The Dakin solution went to dry packings do seem to be helping him as far as the sacral wound is concerned I'm not seeing anything that has me more concerned as far as infection or otherwise is concerned. Overall I'm pleased with the appearance of the wound. 10/26/17-He is here in follow up evaluation for a stage IV sacral ulcer. He continues with daily Dakin's wet-to-dry. He is voicing no complaints or concerns. He will follow-up in 2 weeks 11/16/17-He is  here for follow up evaluation for a palliative stage IV sacral pressure ulcer. We will continue with Dakin's wet-to-dry. He will follow- up in 4 weeks. He is expressing concern/complaint regarding new bed that has arrived, stating he is unable  to manipulate/maneuver it due to the bed crank being at the foot of the bed. 12/14/17-He is here for evaluation for palliative stage IV sacral ulcer. He is voicing no complaints or concerns. We will continue with one-to-one ratio of saline and dakins. He will follow-up in 3 weeks 01/04/18-He is here in follow-up evaluation for palliative stage IV sacral ulcer. He is inquiring about reinstating the negative pressure wound therapy. We discussed at length that the negative pressure wound therapy in the home setting has not been successful for him repeatedly with loss of cereal and unavailability of 24/7 help; reminded him that home health is not available 24/7 when loss of seal occurs. He does verbalize understanding to this and does not pursue. We also discussed the palliative nature of this ulcer (given no significant change/improvement in measurement/appearance, not a candidate for muscle flap per plastic surgery, and continued independent living) and that the goal is for maintenance, decrease in infection and minimizing/avoiding deterioration given that he is independent in his care, does not have home health and requires daily dressing changes secondary to drainage amount. He is inquiring about a wound clinic in [facility], I have informed him that I am unfamiliar with that clinic but that he is encouraged to seek another opinion if that is his desire. We will continue with dakins and he will follow up in three weeks 01/25/18-He is here in follow-up evaluation for palliative stage IV sacral ulcer. He continues with Dakin's/saline 1:1 mixture wet to dry dressing changes. He states he has an appointment at [facility] on 9/17 for evaluation of surgical intervention/closure of the  sacral ulcer. He will follow-up here in 4 weeks Readmission: 07/16/2019 upon evaluation today patient appears to be doing really about the same as when he was previously seen here in the wound care center. He most recently was a patient of [place] back when she was still working here in the center but had been referred to [facility] for consideration of a flap. With that being said the surgeon there at [facility] stated that this was too large for her flap and they have been attempting to get this smaller in order to be able to proceed with a flap. Nonetheless unfortunately he has had a cycle of going back and forth between the osteomyelitis flaring and then sent him back and then making a little bit of progress only to be sent back again. It sounds like most recently they have been using a Iodoflex type dressing at this point which does not seem to have done any harm by any means. With that being said this wound seems to be quite large for using Iodoflex throughout and subsequently I think he may do much better with the use of Vioxx moistened gauze which would be safe for the new tissue growing and also keep the wound quite nice and clean. The patient is not opposed to this he in fact states that his home health nurse had mentioned this as a possibility as well. 07/23/2019 upon evaluation today patient actually appears to be doing very well in regard to his sacral wound. In fact this is much healthier the measurements not terribly different but again with a wound like this at home necessarily expect a a lot of change as far as the overall measurements are concerned in just 1 week's time. This is going be a much longer term process at this point. With that being said I do think that he is very healthy appearing as far as the  base of the wound is concerned. 08/06/2019 upon evaluation today patient actually appears to be doing about the same with regard to his wound to be honest. There is really not a significant  improvement overall based on what I am seeing today. Fortunately there is no signs of significant systemic infection. No fevers, chills, nausea, vomiting, or diarrhea. With that being said there is odor to the drainage from the wound and subsequently also what appears to be increased drainage based on what we are seeing today as well as what his home health nurse called [place] about that she was concerned with as well. 08/13/2019 upon evaluation today patient appears to be doing about the same at this point with regard to his wound although I think the dressing may be a little less drainage wise compared to what it was previous. Fortunately there is no signs of active infection at this time. No fevers, chills, nausea, vomiting, or diarrhea. He has been taking the antibiotic for only 3 days. 08/23/2019 upon evaluation today patient appears to be doing not nearly as well with regard to his wound. In general really not making a lot of progress which is unfortunate. There does not appear to be any signs of active infection at this time. No fevers, chills, nausea, vomiting, or diarrhea. With that being said the patient unfortunately does seem to be experiencing continued epiboly around the edges of the wound as well as significant scar tissue he has been in the majority of his day sitting in his chair which is likely a big reason for all of this. Fortunately there is no Byron, Bradley A. ([id number]) signs of active infection at this time. No fevers, chills, nausea, vomiting, or diarrhea. 08/29/2019 upon evaluation today patient's wound actually appears to be showing some signs of improvement. The region of few areas of new skin growth around the edges of the wound even where he has some of the epiboly. This is actually good news and I think that he has been very aggressive and offloading over the past week since we showed him the pictures of his wound last week. There still may be a chance that he is going require  some sharp debridement to clear away some of these edges but to be honest I think that is can be quite an undertaking. The main reason is that he has a lot of thickened scar tissue and it is good to bleed quite significantly in my opinion. The I think if organ to do that we may even need to have a portable or disposable cautery in order to make sure that we are able to get the area completely sealed up as far as bleeding is concerned. I do have one that we can utilize. However being that the wound looks so much better right now would like to give this 2 weeks to see how things stand and look at that point before making any additional recommendations. 09/12/2019 upon evaluation today patient appears to be making some progress as far as offloading is concerned. He still has a substantial wound but nonetheless he tells me he is off loading much more aggressively at this point. This is obviously good news. No fevers, chills, nausea, vomiting, or diarrhea. 4/22; I have not seen this patient in quite some time. No major change in the condition of the wound or its wound volume. Surrounding maceration of the skin around the edges of the wound. The wound is fairly substantial. To close to the anal opening  to consider a wound VAC. He had extensive trials of plastic surgery at [facility] which really does not result in any improvement. His wound bed is however clean 10/10/2019 upon evaluation today patient appears to be doing well with regard to the wound in the sacral region. He has been tolerating the dressing changes without complication. Fortunately there is no signs of infection and he actually has some epithelial growth noted as well. He tells me has been trying to continue to offload this area which is excellent that is what he needs to do most as far as what he can have in his control. Otherwise I feel like the collagen with a saline moistened gauze behind has been beneficial for him. 5/20; collagen and  normal saline wet-to-dry. Although the tissue when this large sacral wound looks reasonable there is been absolutely no benefit to the overall closure. He has macerated skin around this. He tells me that he spends a large part of the day up in the wheelchair. He still has home health 11/07/2019 upon evaluation today patient appears to be doing well with regard to his wounds at this point. Fortunately there is no signs of active infection at this time. Overall I feel like his sacral wound in general appears to be doing quite nicely. I am very pleased with overall how things are progressing and I feel like the patient is making excellent progress towards resolution. With that being said he still has a long ways to go before we expect to see this completely healed. Objective Constitutional Obese and well-hydrated in no acute distress. Vitals Time Taken: 2:41 AM, Temperature: 98.1 F, Pulse: 79 bpm, Respiratory Rate: 16 breaths/min, Blood Pressure: 115/66 mmHg. Respiratory normal breathing without difficulty. Psychiatric this patient is able to make decisions and demonstrates good insight into disease process. Alert and Oriented x 3. pleasant and cooperative. General Notes: Upon inspection patient's wound bed actually showed signs of good granulation at this time. Fortunately there is no evidence of active infection which is great news. He tells me he just spent three fourths of the day offloading but again depending on how things are going he may sometimes not really make that happen. Integumentary (Hair, Skin) Wound #10 status is Open. Original cause of wound was Pressure Injury. The wound is located on the Midline Sacrum. The wound measures 6cm length x 7.5cm width x 4.5cm depth; 35.343cm^2 area and 159.043cm^3 volume. There is Fat Layer (Subcutaneous Tissue) Exposed exposed. There is a medium amount of serosanguineous drainage noted. Foul odor after cleansing was noted. The wound margin is  epibole. There is large (67-100%) red granulation within the wound bed. There is a small (1-33%) amount of necrotic tissue within the wound bed including Adherent Slough. Wound #11 status is Open. Original cause of wound was Trauma. The wound is located on the Left Gluteal fold. The wound measures 0.5cm length x 0.5cm width x 0.1cm depth; 0.196cm^2 area and 0.02cm^3 volume. There is Fat Layer (Subcutaneous Tissue) Exposed exposed. There is a medium amount of serous drainage noted. The wound margin is flat and intact. There is large (67-100%) pink granulation within the wound bed. There is a small (1-33%) amount of necrotic tissue within the wound bed including Adherent Slough. Assessment Active Problems FYNN, REVES. ([id number]) ICD-10 Pressure ulcer of sacral region, stage 4 Other chronic osteomyelitis, other site Paraplegia, unspecified Plan Wound Cleansing: Wound #10 Midline Sacrum: Clean wound with Normal Saline. Dial antibacterial soap, wash wounds, rinse and pat dry  prior to dressing wounds Wound #11 Left Gluteal fold: Clean wound with Normal Saline. Dial antibacterial soap, wash wounds, rinse and pat dry prior to dressing wounds Primary Wound Dressing: Wound #10 Midline Sacrum: Collagen - in center base of wound moistened with saline Plain packing gauze - wet to dry to fill in space just on midline Wound #11 Left Gluteal fold: Silver Alginate Secondary Dressing: Wound #10 Midline Sacrum: ABD pad - secure with tape XtraSorb - in office only Other - Carboflex 15cmx20cm Wound #11 Left Gluteal fold: Boardered Foam Dressing Dressing Change Frequency: Wound #10 Midline Sacrum: Change dressing every day. - Patient's son to change when [facility] does not visit Other: - [place] physical therapist to evaluate patient for new Roho cushion Wound #11 Left Gluteal fold: Change dressing every day. - Patient's son to change when [facility] does not visit Other: - [place] physical therapist to evaluate  patient for new Roho cushion Follow-up Appointments: Wound #10 Midline Sacrum: Return Appointment in 2 weeks. Wound #11 Left Gluteal fold: Return Appointment in 2 weeks. Off-Loading: Wound #10 Midline Sacrum: Turn and reposition every 2 hours Wound #11 Left Gluteal fold: Turn and reposition every 2 hours Home Health: Wound #10 Midline Sacrum: [place] Nurse may visit PRN to address patient s wound care needs. FACE TO FACE ENCOUNTER: MEDICARE and MEDICAID PATIENTS: I certify that this patient is under my care and that I had a face-to-face encounter that meets the physician face-to-face encounter requirements with this patient on this date. The encounter with the patient was in whole or in part for the following MEDICAL CONDITION: (primary reason for [place]) MEDICAL NECESSITY: I certify, that based on my findings, NURSING services are a medically necessary home health service. HOME BOUND STATUS: I certify that my clinical findings support that this patient is homebound (i.e., Due to illness or injury, pt requires aid of supportive devices such as crutches, cane, wheelchairs, walkers, the use of special transportation or the assistance of another person to leave their place of residence. There is a normal inability to leave the home and doing so requires considerable and taxing effort. Other absences are for medical reasons / religious services and are infrequent or of short duration when for other reasons). If current dressing causes regression in wound condition, may D/C ordered dressing product/s and apply Normal Saline Moist Dressing daily until next [place] / Other MD appointment. [place] of regression in wound condition at [phone]. Please direct any NON-WOUND related issues/requests for orders to patient's Primary Care Physician Wound #11 Left Gluteal fold: [place] Nurse may  visit PRN to address patient s wound care needs. FACE TO FACE ENCOUNTER: MEDICARE and MEDICAID PATIENTS: I certify that this patient is under my care and that I had a face-to-face encounter that meets the physician face-to-face encounter requirements with this patient on this date. The encounter with the patient was in whole or in part for the following MEDICAL CONDITION: (primary reason for [place]) MEDICAL NECESSITY: I certify, that based on my findings, NURSING services are a medically necessary home health service. HOME BOUND STATUS: I certify that my clinical findings support that this patient is homebound (i.e., Due to illness or injury, pt requires aid of supportive devices such as crutches, cane, wheelchairs, walkers, the use of special transportation or the assistance of another person to leave their place of residence. There is a normal inability to leave the home and  doing so requires considerable and taxing effort. Other absences are for medical reasons / religious services and are infrequent or of short duration when for other reasons). If current dressing causes regression in wound condition, may D/C ordered dressing product/s and apply Normal Saline Moist Dressing daily until next [place] / Other MD appointment. [place] of regression in wound condition at [phone]. Please direct any NON-WOUND related issues/requests for orders to patient's Primary Care Physician GAMALIEL, LUNDIE ([id number]) 1. I am recommend currently that we going continue with the wound care measures at this point with regard specifically to the patient's sacral wound. I think that he should continue with the collagen in the base of the wound and then subsequently we are packing and behind this with the saline wet-to-dry dressing and behind. This will be covered with an ABD and XtraSorb to keep the drainage under control. 2. I am also can recommend at this time that the  patient continue with appropriate offloading he really needs to be trying to spend more time off and less time on as far as sitting on this wound area. We will see patient back for reevaluation in 1 week here in the clinic. If anything worsens or changes patient will contact our office for additional recommendations. Electronic Signature(s) Signed: 11/07/2019 2:59:30 PM By: Worthy Keeler PA-C Entered By: Worthy Keeler on 11/07/2019 14:59:30 Bradley [name] ([id number]) -------------------------------------------------------------------------------- SuperBill Details Patient Name: Bradley [name] Date of Service: 11/07/2019 Medical Record Number: [id number] Patient Account Number: [id number] Date of Birth/Sex: 1946-03-22 (74 y.o. M) Treating RN: Army Melia Primary Care Provider: [name] Other Clinician: Referring Provider: [name] Treating Provider/Extender: [name], Davarius Ridener Weeks in Treatment: 16 Diagnosis Coding ICD-10 Codes Code Description L89.154 Pressure ulcer of sacral region, stage 4 M86.68 Other chronic osteomyelitis, other site G82.20 Paraplegia, unspecified Facility Procedures CPT4 Code: [id number] Description: 99213 - WOUND CARE VISIT-LEV 3 EST PT Modifier: Quantity: 1 Physician Procedures CPT4 Code: [id number] Description: [id number] - WC PHYS LEVEL 4 - EST PT Modifier: Quantity: 1 CPT4 Code: Description: ICD-10 Diagnosis Description L89.154 Pressure ulcer of sacral region, stage 4 M86.68 Other chronic osteomyelitis, other site G82.20 Paraplegia, unspecified Modifier: Quantity: Electronic Signature(s) Signed: 11/07/2019 2:59:41 PM By: Worthy Keeler PA-C Entered By: Worthy Keeler on 11/07/2019 14:59:41

## 2019-11-08 DIAGNOSIS — N400 Enlarged prostate without lower urinary tract symptoms: Secondary | ICD-10-CM | POA: Diagnosis not present

## 2019-11-08 DIAGNOSIS — D509 Iron deficiency anemia, unspecified: Secondary | ICD-10-CM | POA: Diagnosis not present

## 2019-11-08 DIAGNOSIS — J309 Allergic rhinitis, unspecified: Secondary | ICD-10-CM | POA: Diagnosis not present

## 2019-11-08 DIAGNOSIS — G8220 Paraplegia, unspecified: Secondary | ICD-10-CM | POA: Diagnosis not present

## 2019-11-08 DIAGNOSIS — N319 Neuromuscular dysfunction of bladder, unspecified: Secondary | ICD-10-CM | POA: Diagnosis not present

## 2019-11-08 DIAGNOSIS — S24104S Unspecified injury at T11-T12 level of thoracic spinal cord, sequela: Secondary | ICD-10-CM | POA: Diagnosis not present

## 2019-11-08 DIAGNOSIS — Z933 Colostomy status: Secondary | ICD-10-CM | POA: Diagnosis not present

## 2019-11-08 DIAGNOSIS — I10 Essential (primary) hypertension: Secondary | ICD-10-CM | POA: Diagnosis not present

## 2019-11-08 DIAGNOSIS — L89154 Pressure ulcer of sacral region, stage 4: Secondary | ICD-10-CM | POA: Diagnosis not present

## 2019-11-11 DIAGNOSIS — L89154 Pressure ulcer of sacral region, stage 4: Secondary | ICD-10-CM | POA: Diagnosis not present

## 2019-11-11 DIAGNOSIS — G8220 Paraplegia, unspecified: Secondary | ICD-10-CM | POA: Diagnosis not present

## 2019-11-11 DIAGNOSIS — N400 Enlarged prostate without lower urinary tract symptoms: Secondary | ICD-10-CM | POA: Diagnosis not present

## 2019-11-11 DIAGNOSIS — Z933 Colostomy status: Secondary | ICD-10-CM | POA: Diagnosis not present

## 2019-11-11 DIAGNOSIS — J309 Allergic rhinitis, unspecified: Secondary | ICD-10-CM | POA: Diagnosis not present

## 2019-11-11 DIAGNOSIS — N319 Neuromuscular dysfunction of bladder, unspecified: Secondary | ICD-10-CM | POA: Diagnosis not present

## 2019-11-11 DIAGNOSIS — S24104S Unspecified injury at T11-T12 level of thoracic spinal cord, sequela: Secondary | ICD-10-CM | POA: Diagnosis not present

## 2019-11-11 DIAGNOSIS — D509 Iron deficiency anemia, unspecified: Secondary | ICD-10-CM | POA: Diagnosis not present

## 2019-11-11 DIAGNOSIS — I10 Essential (primary) hypertension: Secondary | ICD-10-CM | POA: Diagnosis not present

## 2019-11-13 DIAGNOSIS — L89154 Pressure ulcer of sacral region, stage 4: Secondary | ICD-10-CM | POA: Diagnosis not present

## 2019-11-13 DIAGNOSIS — N319 Neuromuscular dysfunction of bladder, unspecified: Secondary | ICD-10-CM | POA: Diagnosis not present

## 2019-11-13 DIAGNOSIS — G8220 Paraplegia, unspecified: Secondary | ICD-10-CM | POA: Diagnosis not present

## 2019-11-13 DIAGNOSIS — S24104S Unspecified injury at T11-T12 level of thoracic spinal cord, sequela: Secondary | ICD-10-CM | POA: Diagnosis not present

## 2019-11-13 DIAGNOSIS — D509 Iron deficiency anemia, unspecified: Secondary | ICD-10-CM | POA: Diagnosis not present

## 2019-11-13 DIAGNOSIS — N400 Enlarged prostate without lower urinary tract symptoms: Secondary | ICD-10-CM | POA: Diagnosis not present

## 2019-11-13 DIAGNOSIS — Z933 Colostomy status: Secondary | ICD-10-CM | POA: Diagnosis not present

## 2019-11-13 DIAGNOSIS — J309 Allergic rhinitis, unspecified: Secondary | ICD-10-CM | POA: Diagnosis not present

## 2019-11-13 DIAGNOSIS — I10 Essential (primary) hypertension: Secondary | ICD-10-CM | POA: Diagnosis not present

## 2019-11-15 DIAGNOSIS — Z933 Colostomy status: Secondary | ICD-10-CM | POA: Diagnosis not present

## 2019-11-15 DIAGNOSIS — G8220 Paraplegia, unspecified: Secondary | ICD-10-CM | POA: Diagnosis not present

## 2019-11-15 DIAGNOSIS — D509 Iron deficiency anemia, unspecified: Secondary | ICD-10-CM | POA: Diagnosis not present

## 2019-11-15 DIAGNOSIS — L89154 Pressure ulcer of sacral region, stage 4: Secondary | ICD-10-CM | POA: Diagnosis not present

## 2019-11-15 DIAGNOSIS — S24104S Unspecified injury at T11-T12 level of thoracic spinal cord, sequela: Secondary | ICD-10-CM | POA: Diagnosis not present

## 2019-11-15 DIAGNOSIS — N400 Enlarged prostate without lower urinary tract symptoms: Secondary | ICD-10-CM | POA: Diagnosis not present

## 2019-11-15 DIAGNOSIS — I10 Essential (primary) hypertension: Secondary | ICD-10-CM | POA: Diagnosis not present

## 2019-11-15 DIAGNOSIS — J309 Allergic rhinitis, unspecified: Secondary | ICD-10-CM | POA: Diagnosis not present

## 2019-11-15 DIAGNOSIS — N319 Neuromuscular dysfunction of bladder, unspecified: Secondary | ICD-10-CM | POA: Diagnosis not present

## 2019-11-18 DIAGNOSIS — J309 Allergic rhinitis, unspecified: Secondary | ICD-10-CM | POA: Diagnosis not present

## 2019-11-18 DIAGNOSIS — L89154 Pressure ulcer of sacral region, stage 4: Secondary | ICD-10-CM | POA: Diagnosis not present

## 2019-11-18 DIAGNOSIS — G8220 Paraplegia, unspecified: Secondary | ICD-10-CM | POA: Diagnosis not present

## 2019-11-18 DIAGNOSIS — D509 Iron deficiency anemia, unspecified: Secondary | ICD-10-CM | POA: Diagnosis not present

## 2019-11-18 DIAGNOSIS — N319 Neuromuscular dysfunction of bladder, unspecified: Secondary | ICD-10-CM | POA: Diagnosis not present

## 2019-11-18 DIAGNOSIS — S24104S Unspecified injury at T11-T12 level of thoracic spinal cord, sequela: Secondary | ICD-10-CM | POA: Diagnosis not present

## 2019-11-18 DIAGNOSIS — Z933 Colostomy status: Secondary | ICD-10-CM | POA: Diagnosis not present

## 2019-11-18 DIAGNOSIS — I10 Essential (primary) hypertension: Secondary | ICD-10-CM | POA: Diagnosis not present

## 2019-11-18 DIAGNOSIS — N400 Enlarged prostate without lower urinary tract symptoms: Secondary | ICD-10-CM | POA: Diagnosis not present

## 2019-11-19 DIAGNOSIS — N319 Neuromuscular dysfunction of bladder, unspecified: Secondary | ICD-10-CM | POA: Diagnosis not present

## 2019-11-20 DIAGNOSIS — N319 Neuromuscular dysfunction of bladder, unspecified: Secondary | ICD-10-CM | POA: Diagnosis not present

## 2019-11-20 DIAGNOSIS — N400 Enlarged prostate without lower urinary tract symptoms: Secondary | ICD-10-CM | POA: Diagnosis not present

## 2019-11-20 DIAGNOSIS — J309 Allergic rhinitis, unspecified: Secondary | ICD-10-CM | POA: Diagnosis not present

## 2019-11-20 DIAGNOSIS — G8220 Paraplegia, unspecified: Secondary | ICD-10-CM | POA: Diagnosis not present

## 2019-11-20 DIAGNOSIS — Z933 Colostomy status: Secondary | ICD-10-CM | POA: Diagnosis not present

## 2019-11-20 DIAGNOSIS — L89154 Pressure ulcer of sacral region, stage 4: Secondary | ICD-10-CM | POA: Diagnosis not present

## 2019-11-20 DIAGNOSIS — D509 Iron deficiency anemia, unspecified: Secondary | ICD-10-CM | POA: Diagnosis not present

## 2019-11-20 DIAGNOSIS — I10 Essential (primary) hypertension: Secondary | ICD-10-CM | POA: Diagnosis not present

## 2019-11-20 DIAGNOSIS — S24104S Unspecified injury at T11-T12 level of thoracic spinal cord, sequela: Secondary | ICD-10-CM | POA: Diagnosis not present

## 2019-11-21 ENCOUNTER — Encounter: Payer: Medicare HMO | Admitting: Physician Assistant

## 2019-11-21 ENCOUNTER — Other Ambulatory Visit: Payer: Self-pay

## 2019-11-21 DIAGNOSIS — M8668 Other chronic osteomyelitis, other site: Secondary | ICD-10-CM | POA: Diagnosis not present

## 2019-11-21 DIAGNOSIS — G8220 Paraplegia, unspecified: Secondary | ICD-10-CM | POA: Diagnosis not present

## 2019-11-21 DIAGNOSIS — L89154 Pressure ulcer of sacral region, stage 4: Secondary | ICD-10-CM | POA: Diagnosis not present

## 2019-11-21 NOTE — Progress Notes (Addendum)
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 specialist in two days on 01/12/17. Otherwise he was transferred back to [place] for continuing to monitor and manage is wound as she progresses with a Wound VAC for the time being. 01/17/17 on evaluation today patient continues to show evidence of slight improvement with the Wound VAC fortunately there's no evidence of infection or otherwise worsening condition in general. Nonetheless we were unable to get him switch to advanced homecare in  regard to home help from his current company. I'm not sure the reasoning behind but for some reason he was not accepted as a patient with him. Continue to apply the Wound VAC which does still show that some maceration around the wound edges but the wound measurements were slightly improved. No fevers, chills, nausea, or vomiting noted at this time. 02/14/17; this patient I have not seen in 5 months although he has been readmitted to our clinic seen by our physician assistant Jeri Cos twice in early August. I have looked through [facility] notes care everywhere. The patient saw plastic surgery in May [Dr. Bhatt}. The patient was sent to general surgery and ultimately had a colostomy placed. On 11/29/16. This was after he was admitted to [facility] sometime in May. An MRI of the pelvis on 5/23 showed osteomyelitis of the coccyx. An attempt was made to drain fluid that was not successful. He was treated with empiric broad-spectrum antibiotics VAC/cefepime/Flagyl starting on 11/02/16 with plans for a  6 week course. According to their notes he was sent to a nursing home. Was last seen by Dr. Myriam Jacobson of plastic surgery on 12/28/16. The first part of the note is a long dissertation about the difficulties finding adequate patients for flap closure of pressure ulcers. At that time the wound was noted to be stage IV based I think on underlying infection no exposed bone and healthy granulation tissue. 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 He was last seen by infectious disease 01/12/17 A Dr. Uvaldo Rising. His note says that Mr. Tutson was not interested in a flap closure for referring a trial of the wound VAC. As previously anticipated the wound VAC could not be maintained as an outpatient in the community. He is now using something similar to a Dakin's wet to dry recommended by [facility] VASHE solution. He is placing this twice a day himself. This is almost s hopeless setting in terms of  heeling 02/28/17; he is using a Dakin's wet to dry. Most of the wound surface looks satisfactory however the deeper area over his coccyx now has exposed bone I'm not sure if I noted this last week. 03/21/17; patient is usingVASHE solution wet to dry which I gather is a variation on Dakin solution. He has home health changing this 3 times a week the other days he does this himself. His appointment with plastic surgery 04/18/17; patient continues to use a variant of Dakin solution I believe. His wound continues to have a clean viable surface. The 2 areas of exposed bone in the center of this wound had closed over. He has an appointment with plastic surgery on December 5 at which time I hope that there'll be a plan for myocutaneous flap closure In looking through [facility] link I couldn't find any more plastic surgery appointments. I did come across the fact that he is been followed by Bradley [name] ([id number]) hematology for a microcytic hypochromic anemia. He had a reasonably normal looking hemoglobin electrophoresis. His iron level was 10 and according to the patient he is going for IV iron infusions starting tomorrow. He had a sedimentation rate of 74. More problematically from a pure wound care point of view his albumin was 2.7 earlier this month 05/17/17; this is a patient I follow monthly. He has a large now stage IV wound over his bilateral buttocks with close proximity to his anal opening. More recently he has developed a large area with exposed bone in the center of this probably secondary to the underlying osteomyelitis E had in the summer. He also follows with Dr. Myriam Jacobson at [facility] who is plastic surgery. He had an appointment earlier this month and according to the patient Dr. Myriam Jacobson does not want to proceed with any attempted flap closure. Although I do not have current access to her note in care everywhere this is likely due to exposed bone. Again according to the patient they did a bone  biopsy. He is still using a variant of Dakin solution changing twice a day. He has home health. The patient is not able to give me a firm answer about how long he spends on this in his wheelchair The patient also states that Dr. Myriam Jacobson wanted to reconsider a wound VAC. I really don't see this as a viable option at least not in the outpatient setting. The wound itself is frankly to close to his anal opening to maintain a seal. The last time we tried to  do this home health was unable to manage it. It might be possible to maintain a wound VAC in this setting outside of the home such as a skilled nursing facility or an [place] however I am doubtful about this even in that setting **** READMISSION 09/21/17-He is here for evaluation of stage IV sacral ulcer. Since his last evaluation here in December he has completed treatment for sacral osteomyelitis. He was at [facility] for IV therapy and NPWT dressing changes. He was discharged, with home health services, in February. He admits that while in the skilled facility he had "80%" success with maintaining dressing, since discharge he has had approximately "40%" success with maintaining wound VAC dressing. We discussed at length that this is not a safe or recommended option. We will apply Dakin's wet to dry dressing daily and he will follow-up next week. He is accompanied today by his sister who is willing to assist in dressing changes; they will discuss the social issue as he feels he is capable of changing dressing daily when home health is not able. 09/28/17-He is here in follow-up evaluation for stage IV sacral pressure ulcer. He has been using the Dakin's wet-to-dry daily; he continues with home health. He is not accompanied by anyone at this visit. He will follow up in two weeks per his request/preference. 10/12/17 on evaluation today patient appears to be doing very well. The Dakin solution went to dry packings do seem to be helping him as far  as the sacral wound is concerned I'm not seeing anything that has me more concerned as far as infection or otherwise is concerned. Overall I'm pleased with the appearance of the wound. 10/26/17-He is here in follow up evaluation for a stage IV sacral ulcer. He continues with daily Dakin's wet-to-dry. He is voicing no complaints or concerns. He will follow-up in 2 weeks 11/16/17-He is here for follow up evaluation for a palliative stage IV sacral pressure ulcer. We will continue with Dakin's wet-to-dry. He will follow- up in 4 weeks. He is expressing concern/complaint regarding new bed that has arrived, stating he is unable to manipulate/maneuver it due to the bed crank being at the foot of the bed. 12/14/17-He is here for evaluation for palliative stage IV sacral ulcer. He is voicing no complaints or concerns. We will continue with one-to-one ratio of saline and dakins. He will follow-up in 3 weeks 01/04/18-He is here in follow-up evaluation for palliative stage IV sacral ulcer. He is inquiring about reinstating the negative pressure wound therapy. We discussed at length that the negative pressure wound therapy in the home setting has not been successful for him repeatedly with loss of cereal and unavailability of 24/7 help; reminded him that home health is not available 24/7 when loss of seal occurs. He does verbalize understanding to this and does not pursue. We also discussed the palliative nature of this ulcer (given no significant change/improvement in measurement/appearance, not a candidate for muscle flap per plastic surgery, and continued independent living) and that the goal is for maintenance, decrease in infection and minimizing/avoiding deterioration given that he is independent in his care, does not have home health and requires daily dressing changes secondary to drainage amount. He is inquiring about a wound clinic in [facility], I have informed him that I am unfamiliar with that clinic but that  he is encouraged to seek another opinion if that is his desire. We will continue with dakins and he will follow up in three weeks 01/25/18-He is here  in follow-up evaluation for palliative stage IV sacral ulcer. He continues with Dakin's/saline 1:1 mixture wet to dry dressing changes. He states he has an appointment at [facility] on 9/17 for evaluation of surgical intervention/closure of the sacral ulcer. He will follow-up here in 4 weeks Readmission: 07/16/2019 upon evaluation today patient appears to be doing really about the same as when he was previously seen here in the wound care center. He most recently was a patient of [facility] back when she was still working here in the center but had been referred to [facility] for consideration of a flap. With that being said the surgeon there at [facility] stated that this was too large for her flap and they have been attempting to get this smaller in order to be able to proceed with a flap. Nonetheless unfortunately he has had a cycle of going back and forth between the osteomyelitis flaring and then sent him back and then making a little bit of progress only to be sent back again. It sounds like most recently they have been using a Iodoflex type dressing at this point which does not seem to have done any harm by any means. With that being said this wound seems to be quite large for using Iodoflex throughout and subsequently I think he may do much better with the use of Vioxx moistened gauze which would be safe for the new tissue growing and also keep the wound quite nice and clean. The patient is not opposed to this he in fact states that his home health nurse had mentioned this as a possibility as well. 07/23/2019 upon evaluation today patient actually appears to be doing very well in regard to his sacral wound. In fact this is much healthier the measurements not terribly different but again with a wound like this at home necessarily expect a a lot of change as far as the  overall measurements are concerned in just 1 week's time. This is going be a much longer term process at this point. With that being said I do think that he is very healthy appearing as far as the base of the wound is concerned. 08/06/2019 upon evaluation today patient actually appears to be doing about the same with regard to his wound to be honest. There is really not a significant improvement overall based on what I am seeing today. Fortunately there is no signs of significant systemic infection. No fevers, chills, nausea, vomiting, or diarrhea. With that being said there is odor to the drainage from the wound and subsequently also what appears to be increased drainage based on what we are seeing today as well as what his home health nurse called [place] about that she was concerned with as well. 08/13/2019 upon evaluation today patient appears to be doing about the same at this point with regard to his wound although I think the dressing may be a little less drainage wise compared to what it was previous. Fortunately there is no signs of active infection at this time. No fevers, chills, nausea, vomiting, or diarrhea. He has been taking the antibiotic for only 3 days. 08/23/2019 upon evaluation today patient appears to be doing not nearly as well with regard to his wound. In general really not making a lot of progress which is unfortunate. There does not appear to be any signs of active infection at this time. No fevers, chills, nausea, vomiting, or diarrhea. With that being said the patient unfortunately does seem to be experiencing continued epiboly around the  edges of the wound as well as significant scar tissue he has been in the majority of his day sitting in his chair which is likely a big reason for all of this. Fortunately there is no Bradley [name], Bradley A. ([id number]) signs of active infection at this time. No fevers, chills, nausea, vomiting, or diarrhea. 08/29/2019 upon evaluation today patient's wound  actually appears to be showing some signs of improvement. The region of few areas of new skin growth around the edges of the wound even where he has some of the epiboly. This is actually good news and I think that he has been very aggressive and offloading over the past week since we showed him the pictures of his wound last week. There still may be a chance that he is going require some sharp debridement to clear away some of these edges but to be honest I think that is can be quite an undertaking. The main reason is that he has a lot of thickened scar tissue and it is good to bleed quite significantly in my opinion. The I think if organ to do that we may even need to have a portable or disposable cautery in order to make sure that we are able to get the area completely sealed up as far as bleeding is concerned. I do have one that we can utilize. However being that the wound looks so much better right now would like to give this 2 weeks to see how things stand and look at that point before making any additional recommendations. 09/12/2019 upon evaluation today patient appears to be making some progress as far as offloading is concerned. He still has a substantial wound but nonetheless he tells me he is off loading much more aggressively at this point. This is obviously good news. No fevers, chills, nausea, vomiting, or diarrhea. 4/22; I have not seen this patient in quite some time. No major change in the condition of the wound or its wound volume. Surrounding maceration of the skin around the edges of the wound. The wound is fairly substantial. To close to the anal opening to consider a wound VAC. He had extensive trials of plastic surgery at [facility] which really does not result in any improvement. His wound bed is however clean 10/10/2019 upon evaluation today patient appears to be doing well with regard to the wound in the sacral region. He has been tolerating the dressing changes without  complication. Fortunately there is no signs of infection and he actually has some epithelial growth noted as well. He tells me has been trying to continue to offload this area which is excellent that is what he needs to do most as far as what he can have in his control. Otherwise I feel like the collagen with a saline moistened gauze behind has been beneficial for him. 5/20; collagen and normal saline wet-to-dry. Although the tissue when this large sacral wound looks reasonable there is been absolutely no benefit to the overall closure. He has macerated skin around this. He tells me that he spends a large part of the day up in the wheelchair. He still has home health 11/07/2019 upon evaluation today patient appears to be doing well with regard to his wounds at this point. Fortunately there is no signs of active infection at this time. Overall I feel like his sacral wound in general appears to be doing quite nicely. I am very pleased with overall how things are progressing and I feel like the patient  is making excellent progress towards resolution. With that being said he still has a long ways to go before we expect to see this completely healed. 11/21/2019 upon evaluation today patient appears to be doing about the same in regard to his sacral wound. Unfortunately he is really not making much in the way of improvement when I questioned him about how much time he is really spending sitting on his bottom he tells me that the majority of his day is mainly in that position though he does spend some time in bed. It does not sound like it is enough however neither does it look like it is enough. Objective Constitutional Well-nourished and well-hydrated in no acute distress. Vitals Time Taken: 10:35 AM, Temperature: 97.8 F, Pulse: 82 bpm, Respiratory Rate: 18 breaths/min, Blood Pressure: 132/64 mmHg. Respiratory normal breathing without difficulty. Psychiatric this patient is able to make decisions and  demonstrates good insight into disease process. Alert and Oriented x 3. pleasant and cooperative. General Notes: Upon inspection patient's wound actually showed signs of basic regulation. He does have evidence of attempts at epithelization around the edges of the wound. With that being said unfortunately I think that he has so much pressure getting to this area which is making it very difficult for him to heal to be honest. Integumentary (Hair, Skin) Wound #10 status is Open. Original cause of wound was Pressure Injury. The wound is located on the Midline Sacrum. The wound measures 7cm length x 6cm width x 4.5cm depth; 32.987cm^2 area and 148.44cm^3 volume. There is muscle and Fat Layer (Subcutaneous Tissue) Exposed exposed. There is no tunneling or undermining noted. There is a medium amount of serosanguineous drainage noted. Foul odor after cleansing was noted. The wound margin is epibole. There is medium (34-66%) red, hyper - granulation within the wound bed. There is a medium (34-66%) amount of necrotic tissue within the wound bed including Adherent Slough. Wound #11 status is Healed - Epithelialized. Original cause of wound was Trauma. The wound is located on the Left Gluteal fold. The wound measures 0cm length x 0cm width x 0cm depth; 0cm^2 area and 0cm^3 volume. There is no tunneling or undermining noted. There is a medium amount of serous drainage noted. The wound margin is flat and intact. There is no granulation within the wound bed. There is no necrotic tissue within the wound bed. Bradley [name], Bradley [name] ([id number]) Assessment Active Problems ICD-10 Pressure ulcer of sacral region, stage 4 Other chronic osteomyelitis, other site Paraplegia, unspecified Plan Wound Cleansing: Wound #10 Midline Sacrum: Clean wound with Normal Saline. Dial antibacterial soap, wash wounds, rinse and pat dry prior to dressing wounds Primary Wound Dressing: Wound #10 Midline Sacrum: Collagen - in center  base of wound moistened with saline Plain packing gauze - wet to dry to fill in space just on midline Secondary Dressing: Wound #10 Midline Sacrum: ABD pad - secure with tape XtraSorb - in office only Other - Carboflex 15cmx20cm Dressing Change Frequency: Wound #10 Midline Sacrum: Change dressing every day. - Patient's son to change when [facility] does not visit Other: - [place] physical therapist to evaluate patient for new Roho cushion Follow-up Appointments: Wound #10 Midline Sacrum: Return Appointment in 2 weeks. Off-Loading: Wound #10 Midline Sacrum: Turn and reposition every 2 hours Home Health: Wound #10 Midline Sacrum: [place] Nurse may visit PRN to address patient s wound care needs. FACE TO FACE ENCOUNTER: MEDICARE and MEDICAID PATIENTS: I certify that this patient is under my  care and that I had a face-to-face encounter that meets the physician face-to-face encounter requirements with this patient on this date. The encounter with the patient was in whole or in part for the following MEDICAL CONDITION: (primary reason for [place]) MEDICAL NECESSITY: I certify, that based on my findings, NURSING services are a medically necessary home health service. HOME BOUND STATUS: I certify that my clinical findings support that this patient is homebound (i.e., Due to illness or injury, pt requires aid of supportive devices such as crutches, cane, wheelchairs, walkers, the use of special transportation or the assistance of another person to leave their place of residence. There is a normal inability to leave the home and doing so requires considerable and taxing effort. Other absences are for medical reasons / religious services and are infrequent or of short duration when for other reasons). If current dressing causes regression in wound condition, may D/C ordered dressing product/s and apply Normal Saline Moist Dressing daily until next [place]  / Other MD appointment. [place] of regression in wound condition at [phone]. Please direct any NON-WOUND related issues/requests for orders to patient's Primary Care Physician 1. I had a long discussion with the patient today about where we are going with this wound. I think that we really need to make a decision as far as how aggressive wound care to be. On one end we could continue to just manage this more palliatively and not really aggressively just trying to keep them clean prevent infection and know that were not really good to get the wound to heal. That is one option. The second option is that we could be more aggressive potentially having him see a surgeon if I can find someone that will help [place] out in this regard and this will be to clear away the rolled edges and try to make this more palatable to be able to use a wound VAC. That would also include admission to a skilled nursing facility if we can work all this out in order for them to help manage his offloading and care during the time that he was on the wound VAC. Again this is something that is probably the best way to manage things if he is really wanting to get this healed but again I explained he needs to think about it and let me know. 2. With regard to the wound today I do not see any signs of infection I would recommend that we continue with the current measures including the collagen along with the saline moistened gauze packed in behind in this rather deep sacral wound and the Xtrasorb. We will see patient back for reevaluation in 2 weeks here in the clinic. If anything worsens or changes patient will contact our office for additional recommendations. Electronic Signature(s) Signed: 11/21/2019 3:03:55 PM By: Worthy Keeler PA-C Entered By: Worthy Keeler on 11/21/2019 15:03:55 Bradley [name] ([id number]) KARNELL, VANDERLOOP  ([id number]) -------------------------------------------------------------------------------- SuperBill Details Patient Name: Bradley [name] Date of Service: 11/21/2019 Medical Record Number: [id number] Patient Account Number: [id number] Date of Birth/Sex: [birth year] (74 y.o. M) Treating RN: Army Melia Primary Care Provider: [name] Other Clinician: Referring Provider: [name] Treating Provider/Extender: [name], Antia Rahal Weeks in Treatment: 18 Diagnosis Coding ICD-10 Codes Code Description L89.154 Pressure ulcer of sacral region, stage 4 M86.68 Other chronic osteomyelitis, other site G82.20 Paraplegia, unspecified Facility Procedures CPT4 Code: 79892119 Description: 99213 - WOUND CARE VISIT-LEV 3 EST PT Modifier: Quantity: 1 Physician  Procedures CPT4 Code: 6659935 Description: 70177 - WC PHYS LEVEL 4 - EST PT Modifier: Quantity: 1 CPT4 Code: Description: ICD-10 Diagnosis Description L89.154 Pressure ulcer of sacral region, stage 4 M86.68 Other chronic osteomyelitis, other site G82.20 Paraplegia, unspecified Modifier: Quantity: Electronic Signature(s) Signed: 11/21/2019 3:23:51 PM By: Worthy Keeler PA-C Entered By: Worthy Keeler on 11/21/2019 15:23:51

## 2019-11-22 DIAGNOSIS — Z933 Colostomy status: Secondary | ICD-10-CM | POA: Diagnosis not present

## 2019-11-22 DIAGNOSIS — S24104S Unspecified injury at T11-T12 level of thoracic spinal cord, sequela: Secondary | ICD-10-CM | POA: Diagnosis not present

## 2019-11-22 DIAGNOSIS — I10 Essential (primary) hypertension: Secondary | ICD-10-CM | POA: Diagnosis not present

## 2019-11-22 DIAGNOSIS — L89154 Pressure ulcer of sacral region, stage 4: Secondary | ICD-10-CM | POA: Diagnosis not present

## 2019-11-22 DIAGNOSIS — G8220 Paraplegia, unspecified: Secondary | ICD-10-CM | POA: Diagnosis not present

## 2019-11-22 DIAGNOSIS — J309 Allergic rhinitis, unspecified: Secondary | ICD-10-CM | POA: Diagnosis not present

## 2019-11-22 DIAGNOSIS — N319 Neuromuscular dysfunction of bladder, unspecified: Secondary | ICD-10-CM | POA: Diagnosis not present

## 2019-11-22 DIAGNOSIS — N400 Enlarged prostate without lower urinary tract symptoms: Secondary | ICD-10-CM | POA: Diagnosis not present

## 2019-11-22 DIAGNOSIS — D509 Iron deficiency anemia, unspecified: Secondary | ICD-10-CM | POA: Diagnosis not present

## 2019-11-22 NOTE — Progress Notes (Signed)
DOIL, KAMARA ([id number]) Visit Report for 11/21/2019 Arrival Information Details Patient Name: Bradley [name], Bradley [name]. Date of Service: 11/21/2019 10:30 AM Medical Record Number: [id number] Patient Account Number: [id number] Date of Birth/Sex: 1945-08-23 (74 y.o. M) Treating RN: Army Melia Primary Care Almeda Ezra: [name] Other Clinician: Referring Albeiro Trompeter: [name] Treating Karyl Sharrar/Extender: [name], HOYT Weeks in Treatment: 6 Visit Information History Since Last Visit Added or deleted any medications: No Patient Arrived: Wheel Chair Any new allergies or adverse reactions: No Arrival Time: 10:34 Had a fall or experienced change in No Accompanied By: self activities of daily living that may affect Transfer Assistance: Harrel Lemon Lift risk of falls: Patient Identification Verified: Yes Signs or symptoms of abuse/neglect since last visito No Secondary Verification Process Completed: Yes Hospitalized since last visit: No Implantable device outside of the clinic excluding No cellular tissue based products placed in the center since last visit: Has Dressing in Place as Prescribed: Yes Pain Present Now: No Electronic Signature(s) Signed: 11/21/2019 3:30:19 PM By: Lorine Bears RCP, RRT, CHT Entered By: Lorine Bears on 11/21/2019 10:34:51 [name] ([id number]) -------------------------------------------------------------------------------- Clinic Level of Care Assessment Details Patient Name: Bradley Loa A. Date of Service: 11/21/2019 10:30 AM Medical Record Number: [id number] Patient Account Number: [id number] Date of Birth/Sex: 12/28/1945 (74 y.o. M) Treating RN: Army Melia Primary Care Yadir Zentner: [name] Other Clinician: Referring Anyae Griffith: [name] Treating Deondria Puryear/Extender: [name], HOYT Weeks in Treatment: 18 Clinic Level of Care Assessment Items TOOL 4 Quantity Score []  - Use when only an EandM  is performed on FOLLOW-UP visit 0 ASSESSMENTS - Nursing Assessment / Reassessment X - Reassessment of Co-morbidities (includes updates in patient status) 1 10 X- 1 5 Reassessment of Adherence to Treatment Plan ASSESSMENTS - Wound and Skin Assessment / Reassessment X - Simple Wound Assessment / Reassessment - one wound 1 5 []  - 0 Complex Wound Assessment / Reassessment - multiple wounds []  - 0 Dermatologic / Skin Assessment (not related to wound area) ASSESSMENTS - Focused Assessment []  - Circumferential Edema Measurements - multi extremities 0 []  - 0 Nutritional Assessment / Counseling / Intervention []  - 0 Lower Extremity Assessment (monofilament, tuning fork, pulses) []  - 0 Peripheral Arterial Disease Assessment (using hand held doppler) ASSESSMENTS - Ostomy and/or Continence Assessment and Care []  - Incontinence Assessment and Management 0 []  - 0 Ostomy Care Assessment and Management (repouching, etc.) PROCESS - Coordination of Care X - Simple Patient / Family Education for ongoing care 1 15 []  - 0 Complex (extensive) Patient / Family Education for ongoing care X- 1 10 Staff obtains Programmer, systems, Records, Test Results / Process Orders X- 1 10 Staff telephones HHA, Nursing Homes / Clarify orders / etc []  - 0 Routine Transfer to another Facility (non-emergent condition) []  - 0 Routine Hospital Admission (non-emergent condition) []  - 0 New Admissions / Biomedical engineer / Ordering NPWT, Apligraf, etc. []  - 0 Emergency Hospital Admission (emergent condition) []  - 0 Simple Discharge Coordination []  - 0 Complex (extensive) Discharge Coordination PROCESS - Special Needs []  - Pediatric / Minor Patient Management 0 []  - 0 Isolation Patient Management []  - 0 Hearing / Language / Visual special needs []  - 0 Assessment of Community assistance (transportation, D/C planning, etc.) []  - 0 Additional assistance / Altered mentation []  - 0 Support Surface(s) Assessment (bed,  cushion, seat, etc.) INTERVENTIONS - Wound Cleansing / Measurement Bradley [name], Bradley A. ([id number]) X- 1 5 Simple Wound Cleansing - one wound []  - 0 Complex Wound Cleansing - multiple  wounds X- 1 5 Wound Imaging (photographs - any number of wounds) []  - 0 Wound Tracing (instead of photographs) X- 1 5 Simple Wound Measurement - one wound []  - 0 Complex Wound Measurement - multiple wounds INTERVENTIONS - Wound Dressings []  - Small Wound Dressing one or multiple wounds 0 X- 1 15 Medium Wound Dressing one or multiple wounds []  - 0 Large Wound Dressing one or multiple wounds []  - 0 Application of Medications - topical []  - 0 Application of Medications - injection INTERVENTIONS - Miscellaneous []  - External ear exam 0 []  - 0 Specimen Collection (cultures, biopsies, blood, body fluids, etc.) []  - 0 Specimen(s) / Culture(s) sent or taken to Lab for analysis []  - 0 Patient Transfer (multiple staff / Civil Service fast streamer / Similar devices) []  - 0 Simple Staple / Suture removal (25 or less) []  - 0 Complex Staple / Suture removal (26 or more) []  - 0 Hypo / Hyperglycemic Management (close monitor of Blood Glucose) []  - 0 Ankle / Brachial Index (ABI) - do not check if billed separately X- 1 5 Vital Signs Has the patient been seen at the hospital within the last three years: Yes Total Score: 90 Level Of Care: New/Established - Level 3 Electronic Signature(s) Signed: 11/21/2019 1:58:43 PM By: Army Melia Entered By: Army Melia on 11/21/2019 11:14:35 [name] ([id number]) -------------------------------------------------------------------------------- Encounter Discharge Information Details Patient Name: Bradley Loa A. Date of Service: 11/21/2019 10:30 AM Medical Record Number: [id number] Patient Account Number: [id number] Date of Birth/Sex: 1946-03-24 (74 y.o. M) Treating RN: Army Melia Primary Care Audrina Marten: [name] Other Clinician: Referring Demaryius Imran: [name] Treating Janet Humphreys/Extender: [name], HOYT Weeks in Treatment: 40 Encounter Discharge Information Items Discharge Condition: Stable Ambulatory Status: Wheelchair Discharge Destination: Home Transportation: Private Auto Accompanied By: self Schedule Follow-up Appointment: Yes Clinical Summary of Care: Electronic Signature(s) Signed: 11/21/2019 1:58:43 PM By: Army Melia Entered By: Army Melia on 11/21/2019 11:15:52 [name] ([id number]) -------------------------------------------------------------------------------- Lower Extremity Assessment Details Patient Name: Bradley Loa A. Date of Service: 11/21/2019 10:30 AM Medical Record Number: [id number] Patient Account Number: [id number] Date of Birth/Sex: Nov 18, 1945 (74 y.o. M) Treating RN: Cornell Barman Primary Care Dayane Hillenburg: [name] Other Clinician: Referring Tameaka Eichhorn: [name] Treating [place] Every/Extender: Worthy Keeler Weeks in Treatment: 18 Electronic Signature(s) Signed: 11/22/2019 11:02:18 AM By: Gretta Cool, BSN, RN, CWS, Kim RN, BSN Entered By: Gretta Cool, BSN, RN, CWS, Kim on 11/21/2019 10:54:09 [name] ([id number]) -------------------------------------------------------------------------------- Multi Wound Chart Details Patient Name: Bradley [name], Bradley A. Date of Service: 11/21/2019 10:30 AM Medical Record Number: [id number] Patient Account Number: [id number] Date of Birth/Sex: [birth year] (74 y.o. M) Treating RN: Army Melia Primary Care Amadeus Oyama: [name] Other Clinician: Referring Sparkles Mcneely: [name] Treating Berlene Dixson/Extender: [name], HOYT Weeks in Treatment: 18 Vital Signs Height(in): Pulse(bpm): 43 Weight(lbs): Blood Pressure(mmHg): 132/64 Body Mass Index(BMI): Temperature(F): 97.8 Respiratory Rate(breaths/min): 18 Photos: [N/A:N/A] Wound Location: Midline Sacrum Left Gluteal fold N/A Wounding Event: Pressure Injury Trauma N/A Primary Etiology: Pressure  Ulcer Skin Tear N/A Comorbid History: Anemia, Hypertension, History of Anemia, Hypertension, History of N/A pressure wounds, Rheumatoid pressure wounds, Rheumatoid Arthritis, Paraplegia Arthritis, Paraplegia Date Acquired: 06/07/2015 10/21/2019 N/A Weeks of Treatment: 18 4 N/A Wound Status: Open Healed - Epithelialized N/A Measurements L x W x D (cm) 7x6x4.5 0x0x0 N/A Area (cm) : 32.987 0 N/A Volume (cm) : 148.44 0 N/A % Reduction in Area: 36.00% 100.00% N/A % Reduction in Volume: 52.00% 100.00% N/A Classification: Category/Stage IV Full Thickness Without Exposed N/A Support Structures Exudate  Amount: Medium Medium N/A Exudate Type: Serosanguineous Serous N/A Exudate Color: red, brown amber N/A Foul Odor After Cleansing: Yes No N/A Odor Anticipated Due to Product No N/A N/A Use: Wound Margin: Epibole Flat and Intact N/A Granulation Amount: Medium (34-66%) None Present (0%) N/A Granulation Quality: Red, Hyper-granulation N/A N/A Necrotic Amount: Medium (34-66%) None Present (0%) N/A Exposed Structures: Fat Layer (Subcutaneous Tissue) Fascia: No N/A Exposed: Yes Fat Layer (Subcutaneous Tissue) Muscle: Yes Exposed: No Fascia: No Tendon: No Tendon: No Muscle: No Joint: No Joint: No Bone: No Bone: No Epithelialization: None Large (67-100%) N/A Treatment Notes Electronic Signature(s) Signed: 11/21/2019 1:58:43 PM By: Jerry Caras ([id number]) Entered By: Army Melia on 11/21/2019 11:04:39 [name] ([id number]) -------------------------------------------------------------------------------- [place] Details Patient Name: Bradley Loa A. Date of Service: 11/21/2019 10:30 AM Medical Record Number: [id number] Patient Account Number: [id number] Date of Birth/Sex: 08/04/1945 (74 y.o. M) Treating RN: Army Melia Primary Care Aron Needles: [name] Other Clinician: Referring Antavion Bartoszek: [name] Treating Shahab Polhamus/Extender:  [name], HOYT Weeks in Treatment: 74 Active Inactive Abuse / Safety / Falls / Self Care Management Nursing Diagnoses: Impaired physical mobility Goals: Patient will not develop complications from immobility Date Initiated: 07/16/2019 Target Resolution Date: 10/12/2019 Goal Status: Active Interventions: Assess Activities of Daily Living upon admission and as needed Notes: Orientation to the Wound Care Program Nursing Diagnoses: Knowledge deficit related to the wound healing center program Goals: Patient/caregiver will verbalize understanding of the [facility] Date Initiated: 07/16/2019 Target Resolution Date: 10/12/2019 Goal Status: Active Interventions: Provide education on orientation to the wound center Notes: Pressure Nursing Diagnoses: Knowledge deficit related to causes and risk factors for pressure ulcer development Goals: Patient will remain free from development of additional pressure ulcers Date Initiated: 07/16/2019 Target Resolution Date: 10/12/2019 Goal Status: Active Interventions: Provide education on pressure ulcers Notes: Wound/Skin Impairment Nursing Diagnoses: Impaired tissue integrity Goals: Ulcer/skin breakdown will heal within 14 weeks Date Initiated: 07/16/2019 Target Resolution Date: 10/12/2019 Goal Status: Active Bradley [name], Bradley [name] ([id number]) Interventions: Assess patient/caregiver ability to obtain necessary supplies Assess patient/caregiver ability to perform ulcer/skin care regimen upon admission and as needed Assess ulceration(s) every visit Notes: Electronic Signature(s) Signed: 11/21/2019 1:58:43 PM By: Army Melia Entered By: Army Melia on 11/21/2019 11:04:27 [name] ([id number]) -------------------------------------------------------------------------------- Pain Assessment Details Patient Name: Bradley Loa A. Date of Service: 11/21/2019 10:30 AM Medical Record Number: [id number] Patient Account Number:  [id number] Date of Birth/Sex: 1945/10/06 (74 y.o. M) Treating RN: Cornell Barman Primary Care Sarabella Caprio: [name] Other Clinician: Referring Aundrea Horace: [name] Treating Damare Serano/Extender: [name], HOYT Weeks in Treatment: 44 Active Problems Location of Pain Severity and Description of Pain Patient Has Paino No Site Locations Pain Management and Medication Current Pain Management: Electronic Signature(s) Signed: 11/22/2019 11:02:18 AM By: Gretta Cool, BSN, RN, CWS, Kim RN, BSN Entered By: Gretta Cool, BSN, RN, CWS, Kim on 11/21/2019 10:49:38 [name] ([id number]) -------------------------------------------------------------------------------- Patient/Caregiver Education Details Patient Name: Bradley Loa A. Date of Service: 11/21/2019 10:30 AM Medical Record Number: [id number] Patient Account Number: [id number] Date of Birth/Gender: Jan 28, 1946 (74 y.o. M) Treating RN: Army Melia Primary Care Physician: [name] Other Clinician: Referring Physician: [name] Treating Physician/Extender: Sharalyn Ink in Treatment: 25 Education Assessment Education Provided To: Patient Education Topics Provided Wound/Skin Impairment: Handouts: Caring for Your Ulcer Methods: Demonstration, Explain/Verbal Responses: State content correctly Electronic Signature(s) Signed: 11/21/2019 1:58:43 PM By: Army Melia Entered By: Army Melia on 11/21/2019 11:14:49 [name] ([id number]) -------------------------------------------------------------------------------- Wound Assessment  Details Patient Name: Bradley [name], Bradley [name]. Date of Service: 11/21/2019 10:30 AM Medical Record Number: [id number] Patient Account Number: [id number] Date of Birth/Sex: 24-Jun-1945 (74 y.o. M) Treating RN: Cornell Barman Primary Care Braxon Suder: [name] Other Clinician: Referring Shawnetta Lein: [name] Treating Dayden Viverette/Extender: [name], HOYT Weeks in Treatment: 18 Wound  Status Wound Number: 10 Primary Pressure Ulcer Etiology: Wound Location: Midline Sacrum Wound Open Wounding Event: Pressure Injury Status: Date Acquired: 06/07/2015 Comorbid Anemia, Hypertension, History of pressure wounds, Weeks Of Treatment: 18 History: Rheumatoid Arthritis, Paraplegia Clustered Wound: No Photos Wound Measurements Length: (cm) 7 % Red Width: (cm) 6 % Red Depth: (cm) 4.5 Epith Area: (cm) 32.987 Tunn Volume: (cm) 148.44 Unde uction in Area: 36% uction in Volume: 52% elialization: None eling: No rmining: No Wound Description Classification: Category/Stage IV Foul Wound Margin: Epibole Due t Exudate Amount: Medium Sloug Exudate Type: Serosanguineous Exudate Color: red, brown Odor After Cleansing: Yes o Product Use: No h/Fibrino Yes Wound Bed Granulation Amount: Medium (34-66%) Exposed Structure Granulation Quality: Red, Hyper-granulation Fascia Exposed: No Necrotic Amount: Medium (34-66%) Fat Layer (Subcutaneous Tissue) Exposed: Yes Necrotic Quality: Adherent Slough Tendon Exposed: No Muscle Exposed: Yes Necrosis of Muscle: No Joint Exposed: No Bone Exposed: No Treatment Notes Wound #10 (Midline Sacrum) Notes prisma, wet to dry, ABd, xsorb Bradley [name], Bradley [name] ([id number]) Electronic Signature(s) Signed: 11/22/2019 11:02:18 AM By: Gretta Cool, BSN, RN, CWS, Kim RN, BSN Entered By: Gretta Cool, BSN, RN, CWS, Kim on 11/21/2019 10:53:12 [name] ([id number]) -------------------------------------------------------------------------------- Wound Assessment Details Patient Name: Bradley Loa A. Date of Service: 11/21/2019 10:30 AM Medical Record Number: [id number] Patient Account Number: [id number] Date of Birth/Sex: 1946-03-26 (74 y.o. M) Treating RN: Cornell Barman Primary Care Favor Kreh: [name] Other Clinician: Referring Tymika Grilli: [name] Treating Aedyn Kempfer/Extender: [name], HOYT Weeks in Treatment: 18 Wound Status Wound Number: 11  Primary Skin Tear Etiology: Wound Location: Left Gluteal fold Wound Healed - Epithelialized Wounding Event: Trauma Status: Date Acquired: 10/21/2019 Comorbid Anemia, Hypertension, History of pressure wounds, Weeks Of Treatment: 4 History: Rheumatoid Arthritis, Paraplegia Clustered Wound: No Photos Wound Measurements Length: (cm) 0 Width: (cm) 0 Depth: (cm) 0 Area: (cm) Volume: (cm) % Reduction in Area: 100% % Reduction in Volume: 100% Epithelialization: Large (67-100%) 0 Tunneling: No 0 Undermining: No Wound Description Classification: Full Thickness Without Exposed Support Structu Wound Margin: Flat and Intact Exudate Amount: Medium Exudate Type: Serous Exudate Color: amber res Foul Odor After Cleansing: No Slough/Fibrino Yes Wound Bed Granulation Amount: None Present (0%) Exposed Structure Necrotic Amount: None Present (0%) Fascia Exposed: No Fat Layer (Subcutaneous Tissue) Exposed: No Tendon Exposed: No Muscle Exposed: No Joint Exposed: No Bone Exposed: No Electronic Signature(s) Signed: 11/22/2019 11:02:18 AM By: Gretta Cool, BSN, RN, CWS, Kim RN, BSN Entered By: Gretta Cool, BSN, RN, CWS, Kim on 11/21/2019 10:53:52 [name] ([id number]) -------------------------------------------------------------------------------- Vitals Details Patient Name: Bradley Loa A. Date of Service: 11/21/2019 10:30 AM Medical Record Number: [id number] Patient Account Number: [id number] Date of Birth/Sex: [birth year] (74 y.o. M) Treating RN: Army Melia Primary Care Jodey Burbano: [name] Other Clinician: Referring Mabry Tift: [name] Treating Arienna Benegas/Extender: [name], HOYT Weeks in Treatment: 18 Vital Signs Time Taken: 10:35 Temperature (F): 97.8 Pulse (bpm): 82 Respiratory Rate (breaths/min): 18 Blood Pressure (mmHg): 132/64 Reference Range: 80 - 120 mg / dl Electronic Signature(s) Signed: 11/21/2019 3:30:19 PM By: Lorine Bears RCP, RRT,  CHT Entered By: Lorine Bears on 11/21/2019 10:38:11

## 2019-11-25 DIAGNOSIS — Z933 Colostomy status: Secondary | ICD-10-CM | POA: Diagnosis not present

## 2019-11-25 DIAGNOSIS — L89154 Pressure ulcer of sacral region, stage 4: Secondary | ICD-10-CM | POA: Diagnosis not present

## 2019-11-25 DIAGNOSIS — I10 Essential (primary) hypertension: Secondary | ICD-10-CM | POA: Diagnosis not present

## 2019-11-25 DIAGNOSIS — J309 Allergic rhinitis, unspecified: Secondary | ICD-10-CM | POA: Diagnosis not present

## 2019-11-25 DIAGNOSIS — N319 Neuromuscular dysfunction of bladder, unspecified: Secondary | ICD-10-CM | POA: Diagnosis not present

## 2019-11-25 DIAGNOSIS — N400 Enlarged prostate without lower urinary tract symptoms: Secondary | ICD-10-CM | POA: Diagnosis not present

## 2019-11-25 DIAGNOSIS — G8220 Paraplegia, unspecified: Secondary | ICD-10-CM | POA: Diagnosis not present

## 2019-11-25 DIAGNOSIS — D509 Iron deficiency anemia, unspecified: Secondary | ICD-10-CM | POA: Diagnosis not present

## 2019-11-25 DIAGNOSIS — S24104S Unspecified injury at T11-T12 level of thoracic spinal cord, sequela: Secondary | ICD-10-CM | POA: Diagnosis not present

## 2019-11-27 DIAGNOSIS — L89154 Pressure ulcer of sacral region, stage 4: Secondary | ICD-10-CM | POA: Diagnosis not present

## 2019-11-27 DIAGNOSIS — G8220 Paraplegia, unspecified: Secondary | ICD-10-CM | POA: Diagnosis not present

## 2019-11-27 DIAGNOSIS — Z933 Colostomy status: Secondary | ICD-10-CM | POA: Diagnosis not present

## 2019-11-27 DIAGNOSIS — S24104S Unspecified injury at T11-T12 level of thoracic spinal cord, sequela: Secondary | ICD-10-CM | POA: Diagnosis not present

## 2019-11-27 DIAGNOSIS — I10 Essential (primary) hypertension: Secondary | ICD-10-CM | POA: Diagnosis not present

## 2019-11-27 DIAGNOSIS — N400 Enlarged prostate without lower urinary tract symptoms: Secondary | ICD-10-CM | POA: Diagnosis not present

## 2019-11-27 DIAGNOSIS — J309 Allergic rhinitis, unspecified: Secondary | ICD-10-CM | POA: Diagnosis not present

## 2019-11-27 DIAGNOSIS — D509 Iron deficiency anemia, unspecified: Secondary | ICD-10-CM | POA: Diagnosis not present

## 2019-11-27 DIAGNOSIS — N319 Neuromuscular dysfunction of bladder, unspecified: Secondary | ICD-10-CM | POA: Diagnosis not present

## 2019-11-29 DIAGNOSIS — I10 Essential (primary) hypertension: Secondary | ICD-10-CM | POA: Diagnosis not present

## 2019-11-29 DIAGNOSIS — N400 Enlarged prostate without lower urinary tract symptoms: Secondary | ICD-10-CM | POA: Diagnosis not present

## 2019-11-29 DIAGNOSIS — N319 Neuromuscular dysfunction of bladder, unspecified: Secondary | ICD-10-CM | POA: Diagnosis not present

## 2019-11-29 DIAGNOSIS — J309 Allergic rhinitis, unspecified: Secondary | ICD-10-CM | POA: Diagnosis not present

## 2019-11-29 DIAGNOSIS — D509 Iron deficiency anemia, unspecified: Secondary | ICD-10-CM | POA: Diagnosis not present

## 2019-11-29 DIAGNOSIS — S24104S Unspecified injury at T11-T12 level of thoracic spinal cord, sequela: Secondary | ICD-10-CM | POA: Diagnosis not present

## 2019-11-29 DIAGNOSIS — G8220 Paraplegia, unspecified: Secondary | ICD-10-CM | POA: Diagnosis not present

## 2019-11-29 DIAGNOSIS — Z933 Colostomy status: Secondary | ICD-10-CM | POA: Diagnosis not present

## 2019-11-29 DIAGNOSIS — L89154 Pressure ulcer of sacral region, stage 4: Secondary | ICD-10-CM | POA: Diagnosis not present

## 2019-12-01 DIAGNOSIS — L89154 Pressure ulcer of sacral region, stage 4: Secondary | ICD-10-CM | POA: Diagnosis not present

## 2019-12-01 DIAGNOSIS — G8220 Paraplegia, unspecified: Secondary | ICD-10-CM | POA: Diagnosis not present

## 2019-12-01 DIAGNOSIS — M869 Osteomyelitis, unspecified: Secondary | ICD-10-CM | POA: Diagnosis not present

## 2019-12-02 DIAGNOSIS — N400 Enlarged prostate without lower urinary tract symptoms: Secondary | ICD-10-CM | POA: Diagnosis not present

## 2019-12-02 DIAGNOSIS — I10 Essential (primary) hypertension: Secondary | ICD-10-CM | POA: Diagnosis not present

## 2019-12-02 DIAGNOSIS — G8220 Paraplegia, unspecified: Secondary | ICD-10-CM | POA: Diagnosis not present

## 2019-12-02 DIAGNOSIS — L89154 Pressure ulcer of sacral region, stage 4: Secondary | ICD-10-CM | POA: Diagnosis not present

## 2019-12-02 DIAGNOSIS — D509 Iron deficiency anemia, unspecified: Secondary | ICD-10-CM | POA: Diagnosis not present

## 2019-12-02 DIAGNOSIS — S24104S Unspecified injury at T11-T12 level of thoracic spinal cord, sequela: Secondary | ICD-10-CM | POA: Diagnosis not present

## 2019-12-02 DIAGNOSIS — Z933 Colostomy status: Secondary | ICD-10-CM | POA: Diagnosis not present

## 2019-12-02 DIAGNOSIS — J309 Allergic rhinitis, unspecified: Secondary | ICD-10-CM | POA: Diagnosis not present

## 2019-12-02 DIAGNOSIS — N319 Neuromuscular dysfunction of bladder, unspecified: Secondary | ICD-10-CM | POA: Diagnosis not present

## 2019-12-04 DIAGNOSIS — N400 Enlarged prostate without lower urinary tract symptoms: Secondary | ICD-10-CM | POA: Diagnosis not present

## 2019-12-04 DIAGNOSIS — I10 Essential (primary) hypertension: Secondary | ICD-10-CM | POA: Diagnosis not present

## 2019-12-04 DIAGNOSIS — J309 Allergic rhinitis, unspecified: Secondary | ICD-10-CM | POA: Diagnosis not present

## 2019-12-04 DIAGNOSIS — L89154 Pressure ulcer of sacral region, stage 4: Secondary | ICD-10-CM | POA: Diagnosis not present

## 2019-12-04 DIAGNOSIS — S24104S Unspecified injury at T11-T12 level of thoracic spinal cord, sequela: Secondary | ICD-10-CM | POA: Diagnosis not present

## 2019-12-04 DIAGNOSIS — D509 Iron deficiency anemia, unspecified: Secondary | ICD-10-CM | POA: Diagnosis not present

## 2019-12-04 DIAGNOSIS — Z933 Colostomy status: Secondary | ICD-10-CM | POA: Diagnosis not present

## 2019-12-04 DIAGNOSIS — G8220 Paraplegia, unspecified: Secondary | ICD-10-CM | POA: Diagnosis not present

## 2019-12-04 DIAGNOSIS — N319 Neuromuscular dysfunction of bladder, unspecified: Secondary | ICD-10-CM | POA: Diagnosis not present

## 2019-12-05 ENCOUNTER — Ambulatory Visit: Payer: Medicare HMO | Admitting: Physician Assistant

## 2019-12-05 DIAGNOSIS — G8220 Paraplegia, unspecified: Secondary | ICD-10-CM | POA: Diagnosis not present

## 2019-12-05 DIAGNOSIS — I10 Essential (primary) hypertension: Secondary | ICD-10-CM | POA: Diagnosis not present

## 2019-12-05 DIAGNOSIS — N319 Neuromuscular dysfunction of bladder, unspecified: Secondary | ICD-10-CM | POA: Diagnosis not present

## 2019-12-05 DIAGNOSIS — L89154 Pressure ulcer of sacral region, stage 4: Secondary | ICD-10-CM | POA: Diagnosis not present

## 2019-12-05 DIAGNOSIS — D509 Iron deficiency anemia, unspecified: Secondary | ICD-10-CM | POA: Diagnosis not present

## 2019-12-05 DIAGNOSIS — N400 Enlarged prostate without lower urinary tract symptoms: Secondary | ICD-10-CM | POA: Diagnosis not present

## 2019-12-06 DIAGNOSIS — L89154 Pressure ulcer of sacral region, stage 4: Secondary | ICD-10-CM | POA: Diagnosis not present

## 2019-12-06 DIAGNOSIS — Z933 Colostomy status: Secondary | ICD-10-CM | POA: Diagnosis not present

## 2019-12-06 DIAGNOSIS — J309 Allergic rhinitis, unspecified: Secondary | ICD-10-CM | POA: Diagnosis not present

## 2019-12-06 DIAGNOSIS — S24104S Unspecified injury at T11-T12 level of thoracic spinal cord, sequela: Secondary | ICD-10-CM | POA: Diagnosis not present

## 2019-12-06 DIAGNOSIS — I10 Essential (primary) hypertension: Secondary | ICD-10-CM | POA: Diagnosis not present

## 2019-12-06 DIAGNOSIS — N319 Neuromuscular dysfunction of bladder, unspecified: Secondary | ICD-10-CM | POA: Diagnosis not present

## 2019-12-06 DIAGNOSIS — D509 Iron deficiency anemia, unspecified: Secondary | ICD-10-CM | POA: Diagnosis not present

## 2019-12-06 DIAGNOSIS — N400 Enlarged prostate without lower urinary tract symptoms: Secondary | ICD-10-CM | POA: Diagnosis not present

## 2019-12-06 DIAGNOSIS — G8220 Paraplegia, unspecified: Secondary | ICD-10-CM | POA: Diagnosis not present

## 2019-12-09 DIAGNOSIS — Z933 Colostomy status: Secondary | ICD-10-CM | POA: Diagnosis not present

## 2019-12-09 DIAGNOSIS — I10 Essential (primary) hypertension: Secondary | ICD-10-CM | POA: Diagnosis not present

## 2019-12-09 DIAGNOSIS — J309 Allergic rhinitis, unspecified: Secondary | ICD-10-CM | POA: Diagnosis not present

## 2019-12-09 DIAGNOSIS — N400 Enlarged prostate without lower urinary tract symptoms: Secondary | ICD-10-CM | POA: Diagnosis not present

## 2019-12-09 DIAGNOSIS — D509 Iron deficiency anemia, unspecified: Secondary | ICD-10-CM | POA: Diagnosis not present

## 2019-12-09 DIAGNOSIS — N319 Neuromuscular dysfunction of bladder, unspecified: Secondary | ICD-10-CM | POA: Diagnosis not present

## 2019-12-09 DIAGNOSIS — L89154 Pressure ulcer of sacral region, stage 4: Secondary | ICD-10-CM | POA: Diagnosis not present

## 2019-12-09 DIAGNOSIS — G8220 Paraplegia, unspecified: Secondary | ICD-10-CM | POA: Diagnosis not present

## 2019-12-09 DIAGNOSIS — S24104S Unspecified injury at T11-T12 level of thoracic spinal cord, sequela: Secondary | ICD-10-CM | POA: Diagnosis not present

## 2019-12-11 DIAGNOSIS — S24104S Unspecified injury at T11-T12 level of thoracic spinal cord, sequela: Secondary | ICD-10-CM | POA: Diagnosis not present

## 2019-12-11 DIAGNOSIS — D509 Iron deficiency anemia, unspecified: Secondary | ICD-10-CM | POA: Diagnosis not present

## 2019-12-11 DIAGNOSIS — J309 Allergic rhinitis, unspecified: Secondary | ICD-10-CM | POA: Diagnosis not present

## 2019-12-11 DIAGNOSIS — N319 Neuromuscular dysfunction of bladder, unspecified: Secondary | ICD-10-CM | POA: Diagnosis not present

## 2019-12-11 DIAGNOSIS — I10 Essential (primary) hypertension: Secondary | ICD-10-CM | POA: Diagnosis not present

## 2019-12-11 DIAGNOSIS — L89154 Pressure ulcer of sacral region, stage 4: Secondary | ICD-10-CM | POA: Diagnosis not present

## 2019-12-11 DIAGNOSIS — N400 Enlarged prostate without lower urinary tract symptoms: Secondary | ICD-10-CM | POA: Diagnosis not present

## 2019-12-11 DIAGNOSIS — G8220 Paraplegia, unspecified: Secondary | ICD-10-CM | POA: Diagnosis not present

## 2019-12-11 DIAGNOSIS — Z933 Colostomy status: Secondary | ICD-10-CM | POA: Diagnosis not present

## 2019-12-13 DIAGNOSIS — I10 Essential (primary) hypertension: Secondary | ICD-10-CM | POA: Diagnosis not present

## 2019-12-13 DIAGNOSIS — G8220 Paraplegia, unspecified: Secondary | ICD-10-CM | POA: Diagnosis not present

## 2019-12-13 DIAGNOSIS — N400 Enlarged prostate without lower urinary tract symptoms: Secondary | ICD-10-CM | POA: Diagnosis not present

## 2019-12-13 DIAGNOSIS — Z933 Colostomy status: Secondary | ICD-10-CM | POA: Diagnosis not present

## 2019-12-13 DIAGNOSIS — L89154 Pressure ulcer of sacral region, stage 4: Secondary | ICD-10-CM | POA: Diagnosis not present

## 2019-12-13 DIAGNOSIS — J309 Allergic rhinitis, unspecified: Secondary | ICD-10-CM | POA: Diagnosis not present

## 2019-12-13 DIAGNOSIS — N319 Neuromuscular dysfunction of bladder, unspecified: Secondary | ICD-10-CM | POA: Diagnosis not present

## 2019-12-13 DIAGNOSIS — S24104S Unspecified injury at T11-T12 level of thoracic spinal cord, sequela: Secondary | ICD-10-CM | POA: Diagnosis not present

## 2019-12-13 DIAGNOSIS — D509 Iron deficiency anemia, unspecified: Secondary | ICD-10-CM | POA: Diagnosis not present

## 2019-12-16 DIAGNOSIS — N400 Enlarged prostate without lower urinary tract symptoms: Secondary | ICD-10-CM | POA: Diagnosis not present

## 2019-12-16 DIAGNOSIS — N319 Neuromuscular dysfunction of bladder, unspecified: Secondary | ICD-10-CM | POA: Diagnosis not present

## 2019-12-16 DIAGNOSIS — Z933 Colostomy status: Secondary | ICD-10-CM | POA: Diagnosis not present

## 2019-12-16 DIAGNOSIS — J309 Allergic rhinitis, unspecified: Secondary | ICD-10-CM | POA: Diagnosis not present

## 2019-12-16 DIAGNOSIS — I10 Essential (primary) hypertension: Secondary | ICD-10-CM | POA: Diagnosis not present

## 2019-12-16 DIAGNOSIS — L89154 Pressure ulcer of sacral region, stage 4: Secondary | ICD-10-CM | POA: Diagnosis not present

## 2019-12-16 DIAGNOSIS — S24104S Unspecified injury at T11-T12 level of thoracic spinal cord, sequela: Secondary | ICD-10-CM | POA: Diagnosis not present

## 2019-12-16 DIAGNOSIS — D509 Iron deficiency anemia, unspecified: Secondary | ICD-10-CM | POA: Diagnosis not present

## 2019-12-16 DIAGNOSIS — G8220 Paraplegia, unspecified: Secondary | ICD-10-CM | POA: Diagnosis not present

## 2019-12-18 DIAGNOSIS — N400 Enlarged prostate without lower urinary tract symptoms: Secondary | ICD-10-CM | POA: Diagnosis not present

## 2019-12-18 DIAGNOSIS — G8220 Paraplegia, unspecified: Secondary | ICD-10-CM | POA: Diagnosis not present

## 2019-12-18 DIAGNOSIS — S24104S Unspecified injury at T11-T12 level of thoracic spinal cord, sequela: Secondary | ICD-10-CM | POA: Diagnosis not present

## 2019-12-18 DIAGNOSIS — Z933 Colostomy status: Secondary | ICD-10-CM | POA: Diagnosis not present

## 2019-12-18 DIAGNOSIS — I10 Essential (primary) hypertension: Secondary | ICD-10-CM | POA: Diagnosis not present

## 2019-12-18 DIAGNOSIS — L89154 Pressure ulcer of sacral region, stage 4: Secondary | ICD-10-CM | POA: Diagnosis not present

## 2019-12-18 DIAGNOSIS — N319 Neuromuscular dysfunction of bladder, unspecified: Secondary | ICD-10-CM | POA: Diagnosis not present

## 2019-12-18 DIAGNOSIS — J309 Allergic rhinitis, unspecified: Secondary | ICD-10-CM | POA: Diagnosis not present

## 2019-12-18 DIAGNOSIS — D509 Iron deficiency anemia, unspecified: Secondary | ICD-10-CM | POA: Diagnosis not present

## 2019-12-20 DIAGNOSIS — G8220 Paraplegia, unspecified: Secondary | ICD-10-CM | POA: Diagnosis not present

## 2019-12-20 DIAGNOSIS — J309 Allergic rhinitis, unspecified: Secondary | ICD-10-CM | POA: Diagnosis not present

## 2019-12-20 DIAGNOSIS — Z933 Colostomy status: Secondary | ICD-10-CM | POA: Diagnosis not present

## 2019-12-20 DIAGNOSIS — I10 Essential (primary) hypertension: Secondary | ICD-10-CM | POA: Diagnosis not present

## 2019-12-20 DIAGNOSIS — N319 Neuromuscular dysfunction of bladder, unspecified: Secondary | ICD-10-CM | POA: Diagnosis not present

## 2019-12-20 DIAGNOSIS — D509 Iron deficiency anemia, unspecified: Secondary | ICD-10-CM | POA: Diagnosis not present

## 2019-12-20 DIAGNOSIS — S24104S Unspecified injury at T11-T12 level of thoracic spinal cord, sequela: Secondary | ICD-10-CM | POA: Diagnosis not present

## 2019-12-20 DIAGNOSIS — N400 Enlarged prostate without lower urinary tract symptoms: Secondary | ICD-10-CM | POA: Diagnosis not present

## 2019-12-20 DIAGNOSIS — L89154 Pressure ulcer of sacral region, stage 4: Secondary | ICD-10-CM | POA: Diagnosis not present

## 2019-12-23 DIAGNOSIS — D509 Iron deficiency anemia, unspecified: Secondary | ICD-10-CM | POA: Diagnosis not present

## 2019-12-23 DIAGNOSIS — N319 Neuromuscular dysfunction of bladder, unspecified: Secondary | ICD-10-CM | POA: Diagnosis not present

## 2019-12-23 DIAGNOSIS — G8220 Paraplegia, unspecified: Secondary | ICD-10-CM | POA: Diagnosis not present

## 2019-12-23 DIAGNOSIS — J309 Allergic rhinitis, unspecified: Secondary | ICD-10-CM | POA: Diagnosis not present

## 2019-12-23 DIAGNOSIS — N400 Enlarged prostate without lower urinary tract symptoms: Secondary | ICD-10-CM | POA: Diagnosis not present

## 2019-12-23 DIAGNOSIS — Z933 Colostomy status: Secondary | ICD-10-CM | POA: Diagnosis not present

## 2019-12-23 DIAGNOSIS — I10 Essential (primary) hypertension: Secondary | ICD-10-CM | POA: Diagnosis not present

## 2019-12-23 DIAGNOSIS — S24104S Unspecified injury at T11-T12 level of thoracic spinal cord, sequela: Secondary | ICD-10-CM | POA: Diagnosis not present

## 2019-12-23 DIAGNOSIS — L89154 Pressure ulcer of sacral region, stage 4: Secondary | ICD-10-CM | POA: Diagnosis not present

## 2019-12-24 ENCOUNTER — Other Ambulatory Visit: Payer: Self-pay

## 2019-12-24 ENCOUNTER — Encounter: Payer: Medicare HMO | Attending: Physician Assistant | Admitting: Physician Assistant

## 2019-12-24 DIAGNOSIS — L98499 Non-pressure chronic ulcer of skin of other sites with unspecified severity: Secondary | ICD-10-CM | POA: Diagnosis not present

## 2019-12-24 DIAGNOSIS — G8220 Paraplegia, unspecified: Secondary | ICD-10-CM | POA: Insufficient documentation

## 2019-12-24 DIAGNOSIS — M069 Rheumatoid arthritis, unspecified: Secondary | ICD-10-CM | POA: Diagnosis not present

## 2019-12-24 DIAGNOSIS — L89154 Pressure ulcer of sacral region, stage 4: Secondary | ICD-10-CM | POA: Diagnosis not present

## 2019-12-25 DIAGNOSIS — J309 Allergic rhinitis, unspecified: Secondary | ICD-10-CM | POA: Diagnosis not present

## 2019-12-25 DIAGNOSIS — L89154 Pressure ulcer of sacral region, stage 4: Secondary | ICD-10-CM | POA: Diagnosis not present

## 2019-12-25 DIAGNOSIS — Z933 Colostomy status: Secondary | ICD-10-CM | POA: Diagnosis not present

## 2019-12-25 DIAGNOSIS — G8220 Paraplegia, unspecified: Secondary | ICD-10-CM | POA: Diagnosis not present

## 2019-12-25 DIAGNOSIS — N319 Neuromuscular dysfunction of bladder, unspecified: Secondary | ICD-10-CM | POA: Diagnosis not present

## 2019-12-25 DIAGNOSIS — S24104S Unspecified injury at T11-T12 level of thoracic spinal cord, sequela: Secondary | ICD-10-CM | POA: Diagnosis not present

## 2019-12-25 DIAGNOSIS — N400 Enlarged prostate without lower urinary tract symptoms: Secondary | ICD-10-CM | POA: Diagnosis not present

## 2019-12-25 DIAGNOSIS — D509 Iron deficiency anemia, unspecified: Secondary | ICD-10-CM | POA: Diagnosis not present

## 2019-12-25 DIAGNOSIS — I10 Essential (primary) hypertension: Secondary | ICD-10-CM | POA: Diagnosis not present

## 2019-12-27 DIAGNOSIS — N400 Enlarged prostate without lower urinary tract symptoms: Secondary | ICD-10-CM | POA: Diagnosis not present

## 2019-12-27 DIAGNOSIS — D509 Iron deficiency anemia, unspecified: Secondary | ICD-10-CM | POA: Diagnosis not present

## 2019-12-27 DIAGNOSIS — G8220 Paraplegia, unspecified: Secondary | ICD-10-CM | POA: Diagnosis not present

## 2019-12-27 DIAGNOSIS — I10 Essential (primary) hypertension: Secondary | ICD-10-CM | POA: Diagnosis not present

## 2019-12-27 DIAGNOSIS — N319 Neuromuscular dysfunction of bladder, unspecified: Secondary | ICD-10-CM | POA: Diagnosis not present

## 2019-12-27 DIAGNOSIS — S24104S Unspecified injury at T11-T12 level of thoracic spinal cord, sequela: Secondary | ICD-10-CM | POA: Diagnosis not present

## 2019-12-27 DIAGNOSIS — J309 Allergic rhinitis, unspecified: Secondary | ICD-10-CM | POA: Diagnosis not present

## 2019-12-27 DIAGNOSIS — Z933 Colostomy status: Secondary | ICD-10-CM | POA: Diagnosis not present

## 2019-12-27 DIAGNOSIS — L89154 Pressure ulcer of sacral region, stage 4: Secondary | ICD-10-CM | POA: Diagnosis not present

## 2019-12-27 NOTE — Progress Notes (Signed)
SAVAS, ELVIN ([id number]) Visit Report for 12/24/2019 Arrival Information Details Patient Name: JEF, FUTCH. Date of Service: 12/24/2019 2:15 PM Medical Record Number: [id number] Patient Account Number: [id number] Date of Birth/Sex: 11-03-1945 (74 y.o. M) Treating RN: Cornell Barman Primary Care Sable Knoles: [name] Other Clinician: Referring Demiana Crumbley: [name] Treating Vivianna Piccini/Extender: [name], HOYT Weeks in Treatment: 23 Visit Information History Since Last Visit Added or deleted any medications: No Patient Arrived: Wheel Chair Any new allergies or adverse reactions: No Arrival Time: 14:11 Had a fall or experienced change in No Accompanied By: self activities of daily living that may affect Transfer Assistance: None risk of falls: Patient Identification Verified: Yes Signs or symptoms of abuse/neglect since last visito No Secondary Verification Process Completed: Yes Hospitalized since last visit: No Implantable device outside of the clinic excluding No cellular tissue based products placed in the center since last visit: Has Dressing in Place as Prescribed: Yes Pain Present Now: No Electronic Signature(s) Signed: 12/24/2019 4:26:07 PM By: Lorine Bears RCP, RRT, CHT Entered By: Lorine Bears on 12/24/2019 14:11:48 [name] ([id number]) -------------------------------------------------------------------------------- Clinic Level of Care Assessment Details Patient Name: [name] Date of Service: 12/24/2019 2:15 PM Medical Record Number: [id number] Patient Account Number: [id number] Date of Birth/Sex: April 27, 1946 (74 y.o. M) Treating RN: Cornell Barman Primary Care Izell Labat: [name] Other Clinician: Referring Clytie Shetley: [name] Treating Sarim Rothman/Extender: [name], HOYT Weeks in Treatment: 23 Clinic Level of Care Assessment Items TOOL 4 Quantity Score []  - Use when only an EandM is performed  on FOLLOW-UP visit 0 ASSESSMENTS - Nursing Assessment / Reassessment X - Reassessment of Co-morbidities (includes updates in patient status) 1 10 X- 1 5 Reassessment of Adherence to Treatment Plan ASSESSMENTS - Wound and Skin Assessment / Reassessment X - Simple Wound Assessment / Reassessment - one wound 1 5 []  - 0 Complex Wound Assessment / Reassessment - multiple wounds []  - 0 Dermatologic / Skin Assessment (not related to wound area) ASSESSMENTS - Focused Assessment []  - Circumferential Edema Measurements - multi extremities 0 []  - 0 Nutritional Assessment / Counseling / Intervention []  - 0 Lower Extremity Assessment (monofilament, tuning fork, pulses) []  - 0 Peripheral Arterial Disease Assessment (using hand held doppler) ASSESSMENTS - Ostomy and/or Continence Assessment and Care []  - Incontinence Assessment and Management 0 []  - 0 Ostomy Care Assessment and Management (repouching, etc.) PROCESS - Coordination of Care X - Simple Patient / Family Education for ongoing care 1 15 []  - 0 Complex (extensive) Patient / Family Education for ongoing care X- 1 10 Staff obtains Programmer, systems, Records, Test Results / Process Orders []  - 0 Staff telephones HHA, Nursing Homes / Clarify orders / etc []  - 0 Routine Transfer to another Facility (non-emergent condition) []  - 0 Routine Hospital Admission (non-emergent condition) []  - 0 New Admissions / Biomedical engineer / Ordering NPWT, Apligraf, etc. []  - 0 Emergency Hospital Admission (emergent condition) X- 1 10 Simple Discharge Coordination []  - 0 Complex (extensive) Discharge Coordination PROCESS - Special Needs []  - Pediatric / Minor Patient Management 0 []  - 0 Isolation Patient Management []  - 0 Hearing / Language / Visual special needs []  - 0 Assessment of Community assistance (transportation, D/C planning, etc.) []  - 0 Additional assistance / Altered mentation []  - 0 Support Surface(s) Assessment (bed, cushion,  seat, etc.) INTERVENTIONS - Wound Cleansing / Measurement DAVIE, CLAUD A. ([id number]) X- 1 5 Simple Wound Cleansing - one wound []  - 0 Complex Wound Cleansing - multiple wounds  X- 1 5 Wound Imaging (photographs - any number of wounds) []  - 0 Wound Tracing (instead of photographs) X- 1 5 Simple Wound Measurement - one wound []  - 0 Complex Wound Measurement - multiple wounds INTERVENTIONS - Wound Dressings []  - Small Wound Dressing one or multiple wounds 0 []  - 0 Medium Wound Dressing one or multiple wounds X- 1 20 Large Wound Dressing one or multiple wounds []  - 0 Application of Medications - topical []  - 0 Application of Medications - injection INTERVENTIONS - Miscellaneous []  - External ear exam 0 []  - 0 Specimen Collection (cultures, biopsies, blood, body fluids, etc.) []  - 0 Specimen(s) / Culture(s) sent or taken to Lab for analysis []  - 0 Patient Transfer (multiple staff / Civil Service fast streamer / Similar devices) []  - 0 Simple Staple / Suture removal (25 or less) []  - 0 Complex Staple / Suture removal (26 or more) []  - 0 Hypo / Hyperglycemic Management (close monitor of Blood Glucose) []  - 0 Ankle / Brachial Index (ABI) - do not check if billed separately X- 1 5 Vital Signs Has the patient been seen at the hospital within the last three years: Yes Total Score: 95 Level Of Care: New/Established - Level 3 Electronic Signature(s) Signed: 12/26/2019 6:25:29 PM By: Gretta Cool, BSN, RN, CWS, Kim RN, BSN Entered By: Gretta Cool, BSN, RN, CWS, Kim on 12/24/2019 14:41:10 [name] ([id number]) -------------------------------------------------------------------------------- Encounter Discharge Information Details Patient Name: [name] Date of Service: 12/24/2019 2:15 PM Medical Record Number: [id number] Patient Account Number: [id number] Date of Birth/Sex: February 15, 1946 (74 y.o. M) Treating RN: Cornell Barman Primary Care Easter Schinke: [name] Other Clinician: Referring  Norvell Ureste: [name] Treating Nohemy Koop/Extender: [name], HOYT Weeks in Treatment: 6 Encounter Discharge Information Items Discharge Condition: Stable Ambulatory Status: Ambulatory Discharge Destination: Home Transportation: Private Auto Accompanied By: self Schedule Follow-up Appointment: Yes Clinical Summary of Care: Electronic Signature(s) Signed: 12/26/2019 6:25:29 PM By: Gretta Cool, BSN, RN, CWS, Kim RN, BSN Entered By: Gretta Cool, BSN, RN, CWS, Kim on 12/24/2019 14:42:06 [name] ([id number]) -------------------------------------------------------------------------------- Lower Extremity Assessment Details Patient Name: OAKLAN, PERSONS A. Date of Service: 12/24/2019 2:15 PM Medical Record Number: [id number] Patient Account Number: [id number] Date of Birth/Sex: 1945/09/24 (74 y.o. M) Treating RN: Cornell Barman Primary Care Valory Wetherby: [name] Other Clinician: Referring Chianti Goh: [name] Treating Severn Goddard/Extender: Worthy Keeler Weeks in Treatment: 23 Electronic Signature(s) Signed: 12/26/2019 6:25:29 PM By: Gretta Cool, BSN, RN, CWS, Kim RN, BSN Entered By: Gretta Cool, BSN, RN, CWS, Kim on 12/24/2019 14:33:05 [name] ([id number]) -------------------------------------------------------------------------------- Multi Wound Chart Details Patient Name: CHANE, COWDEN A. Date of Service: 12/24/2019 2:15 PM Medical Record Number: [id number] Patient Account Number: [id number] Date of Birth/Sex: Jul 13, 1945 (74 y.o. M) Treating RN: Cornell Barman Primary Care Lovelle Lema: [name] Other Clinician: Referring Bairon Klemann: [name] Treating Crosby Oriordan/Extender: [name], HOYT Weeks in Treatment: 23 Vital Signs Height(in): Pulse(bpm): 25 Weight(lbs): Blood Pressure(mmHg): 151/64 Body Mass Index(BMI): Temperature(F): 98.7 Respiratory Rate(breaths/min): 18 Photos: [N/A:N/A] Wound Location: Midline Sacrum N/A N/A Wounding Event: Pressure Injury N/A  N/A Primary Etiology: Pressure Ulcer N/A N/A Comorbid History: Anemia, Hypertension, History of N/A N/A pressure wounds, Rheumatoid Arthritis, Paraplegia Date Acquired: 06/07/2015 N/A N/A Weeks of Treatment: 23 N/A N/A Wound Status: Open N/A N/A Measurements L x W x D (cm) 12x8x4.8 N/A N/A Area (cm) : 75.398 N/A N/A Volume (cm) : 361.911 N/A N/A % Reduction in Area: -46.30% N/A N/A % Reduction in Volume: -17.10% N/A N/A Classification: Category/Stage IV N/A N/A Exudate Amount: Medium N/A  N/A Exudate Type: Serosanguineous N/A N/A Exudate Color: red, brown N/A N/A Foul Odor After Cleansing: Yes N/A N/A Odor Anticipated Due to Product No N/A N/A Use: Wound Margin: Epibole N/A N/A Granulation Amount: Medium (34-66%) N/A N/A Granulation Quality: Red, Hyper-granulation N/A N/A Necrotic Amount: Medium (34-66%) N/A N/A Exposed Structures: Fat Layer (Subcutaneous Tissue) N/A N/A Exposed: Yes Muscle: Yes Fascia: No Tendon: No Joint: No Bone: No Epithelialization: None N/A N/A Treatment Notes Electronic Signature(s) Signed: 12/26/2019 6:25:29 PM By: Gretta Cool, BSN, RN, CWS, Kim RN, BSN Entered By: Gretta Cool, BSN, RN, CWS, Kim on 12/24/2019 14:33:24 [name] ([id number]) COLIE, FUGITT ([id number]) -------------------------------------------------------------------------------- Welcome Details Patient Name: LIONARDO, HAZE A. Date of Service: 12/24/2019 2:15 PM Medical Record Number: [id number] Patient Account Number: [id number] Date of Birth/Sex: September 10, 1945 (74 y.o. M) Treating RN: Cornell Barman Primary Care Daman Steffenhagen: [name] Other Clinician: Referring Kyleah Pensabene: [name] Treating Liz Pinho/Extender: [name], HOYT Weeks in Treatment: 89 Active Inactive Abuse / Safety / Falls / Self Care Management Nursing Diagnoses: Impaired physical mobility Goals: Patient will not develop complications from immobility Date Initiated: 07/16/2019 Target  Resolution Date: 10/12/2019 Goal Status: Active Interventions: Assess Activities of Daily Living upon admission and as needed Notes: Orientation to the Wound Care Program Nursing Diagnoses: Knowledge deficit related to the wound healing center program Goals: Patient/caregiver will verbalize understanding of the [facility] Program Date Initiated: 07/16/2019 Target Resolution Date: 10/12/2019 Goal Status: Active Interventions: Provide education on orientation to the wound center Notes: Pressure Nursing Diagnoses: Knowledge deficit related to causes and risk factors for pressure ulcer development Goals: Patient will remain free from development of additional pressure ulcers Date Initiated: 07/16/2019 Target Resolution Date: 10/12/2019 Goal Status: Active Interventions: Provide education on pressure ulcers Notes: Wound/Skin Impairment Nursing Diagnoses: Impaired tissue integrity Goals: Ulcer/skin breakdown will heal within 14 weeks Date Initiated: 07/16/2019 Target Resolution Date: 10/12/2019 Goal Status: Active NUEL, DEJAYNES ([id number]) Interventions: Assess patient/caregiver ability to obtain necessary supplies Assess patient/caregiver ability to perform ulcer/skin care regimen upon admission and as needed Assess ulceration(s) every visit Notes: Electronic Signature(s) Signed: 12/26/2019 6:25:29 PM By: Gretta Cool, BSN, RN, CWS, Kim RN, BSN Entered By: Gretta Cool, BSN, RN, CWS, Kim on 12/24/2019 14:33:12 [name] ([id number]) -------------------------------------------------------------------------------- Pain Assessment Details Patient Name: [name] Date of Service: 12/24/2019 2:15 PM Medical Record Number: [id number] Patient Account Number: [id number] Date of Birth/Sex: [birth year] (74 y.o. M) Treating RN: Cornell Barman Primary Care Kemesha Mosey: [name] Other Clinician: Referring Tod Abrahamsen: [name] Treating Rahi Chandonnet/Extender: [name], HOYT Weeks  in Treatment: 23 Active Problems Location of Pain Severity and Description of Pain Patient Has Paino No Site Locations Pain Management and Medication Current Pain Management: Electronic Signature(s) Signed: 12/24/2019 4:02:12 PM By: Sandre Kitty Signed: 12/26/2019 6:25:29 PM By: Gretta Cool, BSN, RN, CWS, Kim RN, BSN Entered By: Sandre Kitty on 12/24/2019 14:26:48 [name] ([id number]) -------------------------------------------------------------------------------- Patient/Caregiver Education Details Patient Name: QUENTEZ, LOBER A. Date of Service: 12/24/2019 2:15 PM Medical Record Number: [id number] Patient Account Number: [id number] Date of Birth/Gender: [birth year] (74 y.o. M) Treating RN: Cornell Barman Primary Care Physician: [name] Other Clinician: Referring Physician: [name] Treating Physician/Extender: Sharalyn Ink in Treatment: 13 Education Assessment Education Provided To: Patient Education Topics Provided Welcome To [facility]: Handouts: Welcome To [facility] Methods: Demonstration, Explain/Verbal Responses: State content correctly Wound/Skin Impairment: Handouts: Caring for Your Ulcer Methods: Demonstration, Explain/Verbal Responses: State content correctly Electronic Signature(s) Signed: 12/26/2019 6:25:29 PM By: Gretta Cool,  BSN, RN, CWS, Kim RN, BSN Entered By: Gretta Cool, BSN, RN, CWS, Kim on 12/24/2019 14:41:36 [name] ([id number]) -------------------------------------------------------------------------------- Wound Assessment Details Patient Name: ARJUN, HARD A. Date of Service: 12/24/2019 2:15 PM Medical Record Number: [id number] Patient Account Number: [id number] Date of Birth/Sex: 11-16-1945 (74 y.o. M) Treating RN: Cornell Barman Primary Care Kizer Nobbe: [name] Other Clinician: Referring Roxane Puerto: [name] Treating Katelen Luepke/Extender: [name], HOYT Weeks in Treatment: 23 Wound  Status Wound Number: 10 Primary Pressure Ulcer Etiology: Wound Location: Midline Sacrum Wound Open Wounding Event: Pressure Injury Status: Date Acquired: 06/07/2015 Comorbid Anemia, Hypertension, History of pressure wounds, Weeks Of Treatment: 23 History: Rheumatoid Arthritis, Paraplegia Clustered Wound: No Photos Wound Measurements Length: (cm) 12 % Redu Width: (cm) 8 % Redu Depth: (cm) 4.8 Epithe Area: (cm) 75.398 Volume: (cm) 361.911 ction in Area: -46.3% ction in Volume: -17.1% lialization: None Wound Description Classification: Category/Stage IV Foul O Wound Margin: Epibole Due to Exudate Amount: Medium Slough Exudate Type: Serosanguineous Exudate Color: red, brown dor After Cleansing: Yes Product Use: No /Fibrino Yes Wound Bed Granulation Amount: Medium (34-66%) Exposed Structure Granulation Quality: Red, Hyper-granulation Fascia Exposed: No Necrotic Amount: Medium (34-66%) Fat Layer (Subcutaneous Tissue) Exposed: Yes Necrotic Quality: Adherent Slough Tendon Exposed: No Muscle Exposed: Yes Necrosis of Muscle: No Joint Exposed: No Bone Exposed: No Treatment Notes Wound #10 (Midline Sacrum) Notes prisma, wet to dry, ABd, xsorb HERNAN, TURNAGE ([id number]) Electronic Signature(s) Signed: 12/24/2019 4:02:12 PM By: Sandre Kitty Signed: 12/26/2019 6:25:29 PM By: Gretta Cool, BSN, RN, CWS, Kim RN, BSN Entered By: Sandre Kitty on 12/24/2019 14:27:07 [name] ([id number]) -------------------------------------------------------------------------------- Vitals Details Patient Name: [name] Date of Service: 12/24/2019 2:15 PM Medical Record Number: [id number] Patient Account Number: [id number] Date of Birth/Sex: 12-10-1945 (74 y.o. M) Treating RN: Cornell Barman Primary Care Jasmin Trumbull: [name] Other Clinician: Referring Rawlins Stuard: [name] Treating Tacoya Altizer/Extender: [name], HOYT Weeks in Treatment: 23 Vital Signs Time Taken:  14:10 Temperature (F): 98.7 Pulse (bpm): 82 Respiratory Rate (breaths/min): 18 Blood Pressure (mmHg): 151/64 Reference Range: 80 - 120 mg / dl Electronic Signature(s) Signed: 12/24/2019 4:26:07 PM By: Lorine Bears RCP, RRT, CHT Entered By: Lorine Bears on 12/24/2019 14:12:55

## 2019-12-30 DIAGNOSIS — G8220 Paraplegia, unspecified: Secondary | ICD-10-CM | POA: Diagnosis not present

## 2019-12-30 DIAGNOSIS — N319 Neuromuscular dysfunction of bladder, unspecified: Secondary | ICD-10-CM | POA: Diagnosis not present

## 2019-12-30 DIAGNOSIS — J309 Allergic rhinitis, unspecified: Secondary | ICD-10-CM | POA: Diagnosis not present

## 2019-12-30 DIAGNOSIS — Z933 Colostomy status: Secondary | ICD-10-CM | POA: Diagnosis not present

## 2019-12-30 DIAGNOSIS — N400 Enlarged prostate without lower urinary tract symptoms: Secondary | ICD-10-CM | POA: Diagnosis not present

## 2019-12-30 DIAGNOSIS — S24104S Unspecified injury at T11-T12 level of thoracic spinal cord, sequela: Secondary | ICD-10-CM | POA: Diagnosis not present

## 2019-12-30 DIAGNOSIS — I10 Essential (primary) hypertension: Secondary | ICD-10-CM | POA: Diagnosis not present

## 2019-12-30 DIAGNOSIS — D509 Iron deficiency anemia, unspecified: Secondary | ICD-10-CM | POA: Diagnosis not present

## 2019-12-30 DIAGNOSIS — L89154 Pressure ulcer of sacral region, stage 4: Secondary | ICD-10-CM | POA: Diagnosis not present

## 2019-12-31 DIAGNOSIS — M869 Osteomyelitis, unspecified: Secondary | ICD-10-CM | POA: Diagnosis not present

## 2019-12-31 DIAGNOSIS — G8220 Paraplegia, unspecified: Secondary | ICD-10-CM | POA: Diagnosis not present

## 2019-12-31 DIAGNOSIS — L89154 Pressure ulcer of sacral region, stage 4: Secondary | ICD-10-CM | POA: Diagnosis not present

## 2020-01-01 DIAGNOSIS — D509 Iron deficiency anemia, unspecified: Secondary | ICD-10-CM | POA: Diagnosis not present

## 2020-01-01 DIAGNOSIS — I10 Essential (primary) hypertension: Secondary | ICD-10-CM | POA: Diagnosis not present

## 2020-01-01 DIAGNOSIS — S24104S Unspecified injury at T11-T12 level of thoracic spinal cord, sequela: Secondary | ICD-10-CM | POA: Diagnosis not present

## 2020-01-01 DIAGNOSIS — G8220 Paraplegia, unspecified: Secondary | ICD-10-CM | POA: Diagnosis not present

## 2020-01-01 DIAGNOSIS — N400 Enlarged prostate without lower urinary tract symptoms: Secondary | ICD-10-CM | POA: Diagnosis not present

## 2020-01-01 DIAGNOSIS — N319 Neuromuscular dysfunction of bladder, unspecified: Secondary | ICD-10-CM | POA: Diagnosis not present

## 2020-01-01 DIAGNOSIS — J309 Allergic rhinitis, unspecified: Secondary | ICD-10-CM | POA: Diagnosis not present

## 2020-01-01 DIAGNOSIS — L89154 Pressure ulcer of sacral region, stage 4: Secondary | ICD-10-CM | POA: Diagnosis not present

## 2020-01-01 DIAGNOSIS — Z933 Colostomy status: Secondary | ICD-10-CM | POA: Diagnosis not present

## 2020-01-03 DIAGNOSIS — N319 Neuromuscular dysfunction of bladder, unspecified: Secondary | ICD-10-CM | POA: Diagnosis not present

## 2020-01-03 DIAGNOSIS — D509 Iron deficiency anemia, unspecified: Secondary | ICD-10-CM | POA: Diagnosis not present

## 2020-01-03 DIAGNOSIS — Z933 Colostomy status: Secondary | ICD-10-CM | POA: Diagnosis not present

## 2020-01-03 DIAGNOSIS — J309 Allergic rhinitis, unspecified: Secondary | ICD-10-CM | POA: Diagnosis not present

## 2020-01-03 DIAGNOSIS — G8220 Paraplegia, unspecified: Secondary | ICD-10-CM | POA: Diagnosis not present

## 2020-01-03 DIAGNOSIS — N400 Enlarged prostate without lower urinary tract symptoms: Secondary | ICD-10-CM | POA: Diagnosis not present

## 2020-01-03 DIAGNOSIS — I10 Essential (primary) hypertension: Secondary | ICD-10-CM | POA: Diagnosis not present

## 2020-01-03 DIAGNOSIS — S24104S Unspecified injury at T11-T12 level of thoracic spinal cord, sequela: Secondary | ICD-10-CM | POA: Diagnosis not present

## 2020-01-03 DIAGNOSIS — L89154 Pressure ulcer of sacral region, stage 4: Secondary | ICD-10-CM | POA: Diagnosis not present

## 2020-01-06 DIAGNOSIS — N400 Enlarged prostate without lower urinary tract symptoms: Secondary | ICD-10-CM | POA: Diagnosis not present

## 2020-01-06 DIAGNOSIS — I10 Essential (primary) hypertension: Secondary | ICD-10-CM | POA: Diagnosis not present

## 2020-01-06 DIAGNOSIS — S24104S Unspecified injury at T11-T12 level of thoracic spinal cord, sequela: Secondary | ICD-10-CM | POA: Diagnosis not present

## 2020-01-06 DIAGNOSIS — G8220 Paraplegia, unspecified: Secondary | ICD-10-CM | POA: Diagnosis not present

## 2020-01-06 DIAGNOSIS — N319 Neuromuscular dysfunction of bladder, unspecified: Secondary | ICD-10-CM | POA: Diagnosis not present

## 2020-01-06 DIAGNOSIS — D509 Iron deficiency anemia, unspecified: Secondary | ICD-10-CM | POA: Diagnosis not present

## 2020-01-06 DIAGNOSIS — Z933 Colostomy status: Secondary | ICD-10-CM | POA: Diagnosis not present

## 2020-01-06 DIAGNOSIS — J309 Allergic rhinitis, unspecified: Secondary | ICD-10-CM | POA: Diagnosis not present

## 2020-01-06 DIAGNOSIS — L89154 Pressure ulcer of sacral region, stage 4: Secondary | ICD-10-CM | POA: Diagnosis not present

## 2020-01-07 ENCOUNTER — Other Ambulatory Visit: Payer: Self-pay

## 2020-01-07 ENCOUNTER — Encounter: Payer: Medicare HMO | Attending: Physician Assistant | Admitting: Physician Assistant

## 2020-01-07 DIAGNOSIS — G8220 Paraplegia, unspecified: Secondary | ICD-10-CM | POA: Diagnosis not present

## 2020-01-07 DIAGNOSIS — Z933 Colostomy status: Secondary | ICD-10-CM | POA: Insufficient documentation

## 2020-01-07 DIAGNOSIS — L89153 Pressure ulcer of sacral region, stage 3: Secondary | ICD-10-CM | POA: Diagnosis not present

## 2020-01-07 DIAGNOSIS — L89154 Pressure ulcer of sacral region, stage 4: Secondary | ICD-10-CM | POA: Insufficient documentation

## 2020-01-07 DIAGNOSIS — M069 Rheumatoid arthritis, unspecified: Secondary | ICD-10-CM | POA: Insufficient documentation

## 2020-01-07 DIAGNOSIS — M8668 Other chronic osteomyelitis, other site: Secondary | ICD-10-CM | POA: Diagnosis not present

## 2020-01-07 NOTE — Progress Notes (Addendum)
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 nonviable skin and subcutaneous tissue from around the large amount of the circumference of this wound we have made absolutely no progress. This may be an offloading issue I'm just not completely certain. It has 2 close proximity in its inferior aspect to consider negative pressure therapy 10/26/16; READMISSION This patient called our clinic yesterday to report an odor in his wound. He had been to see plastic surgery at [facility] at our request after  his last visit on 09/21/16; we have been seeing him for several months with a large stage III wound. He had been sent to general surgery for consideration of a colostomy, that appointment was not until mid June He comes in today with a temperature of 101. He is reporting an odor in the wound since last weekend. 01/10/17 Readmission: 01/10/17 On evaluation today it is noted that patient has been seen by plastic surgery at [facility] since he was last evaluated here. They did discuss with him the possibility of a flap according to the notes but unfortunately at this point he was not quite ready to proceed with surgery and instead wanted to give the Wound VAC a try. In the hospital they were able to get a good seal on the Wound VAC. Unfortunately since that time they have been having trouble in regard to his current home health company keeping a  simple on the Wound VAC. He would like to switch to a different home health company. With that being said it sounds as if the problem is that his wound VAC is not feeling at the lower portion of his back and he tells me that he can take some of the clear plastic and put over that area when the sill breaks and it will correct it for time. He has no discomfort or pain which is good news. He has been treated with IV vancomycin since he was last seen here and has an appointment with a infectious disease specialist in two days on 01/12/17. Otherwise he was transferred back to [place] for continuing to monitor and manage is wound as she progresses with a Wound VAC for the time being. 01/17/17 on evaluation today patient continues to show evidence of slight improvement with the Wound VAC fortunately there's no evidence of infection or otherwise worsening condition in general. Nonetheless we were unable to get him switch to advanced homecare in regard to home help from his current company. I'm not sure the reasoning behind but for some reason he was not accepted as a patient with him.  Continue to apply the Wound VAC which does still show that some maceration around the wound edges but the wound measurements were slightly improved. No fevers, chills, nausea, or vomiting noted at this time. 02/14/17; this patient I have not seen in 5 months although he has been readmitted to our clinic seen by our physician assistant Jeri Cos twice in early August. I have looked through [facility] notes care everywhere. The patient saw plastic surgery in May [Dr. Bhatt}. The patient was sent to general surgery and ultimately had a colostomy placed. On 11/29/16. This was after he was admitted to [facility] sometime in May. An MRI of the pelvis on 5/23 showed osteomyelitis of the coccyx. An attempt was made to drain fluid that was not successful. He was treated with empiric broad-spectrum antibiotics VAC/cefepime/Flagyl starting on 11/02/16 with plans for a 6 week course. According to their notes he was sent to a nursing home. Was last seen by Dr. Myriam Jacobson of plastic surgery on 12/28/16. The first part of the note is a long dissertation about the difficulties finding adequate patients for flap closure of pressure ulcers. At that time the wound was noted to be stage IV based I think on underlying infection no exposed bone and healthy granulation tissue. 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 He was last seen by infectious disease 01/12/17 A Dr. Uvaldo Rising. His note says that Mr. Ciullo was not interested in a flap closure for referring a trial of the wound VAC. As previously anticipated the wound VAC could not be maintained as an outpatient in the community. He is now using something similar to a Dakin's wet to dry recommended by [facility] VASHE solution. He is placing this twice a day himself. This is almost s hopeless setting in terms of heeling 02/28/17; he is using a Dakin's wet to dry. Most of the wound surface looks satisfactory however the deeper area over his coccyx now has  exposed bone I'm not sure if I noted this last week. 03/21/17; patient is usingVASHE solution wet to dry which I gather is a variation on Dakin solution. He has home health changing this 3 times a week the other days he does this himself. His appointment with plastic surgery 04/18/17; patient continues to use a variant of Dakin solution I  believe. His wound continues to have a clean viable surface. The 2 areas of exposed bone in the center of this wound had closed over. He has an appointment with plastic surgery on December 5 at which time I hope that there'll be a plan for myocutaneous flap closure In looking through [place] link I couldn't find any more plastic surgery appointments. I did come across the fact that he is been followed by Bradley [name] ([id number]) hematology for a microcytic hypochromic anemia. He had a reasonably normal looking hemoglobin electrophoresis. His iron level was 10 and according to the patient he is going for IV iron infusions starting tomorrow. He had a sedimentation rate of 74. More problematically from a pure wound care point of view his albumin was 2.7 earlier this month 05/17/17; this is a patient I follow monthly. He has a large now stage IV wound over his bilateral buttocks with close proximity to his anal opening. More recently he has developed a large area with exposed bone in the center of this probably secondary to the underlying osteomyelitis E had in the summer. He also follows with Dr. Myriam Jacobson at [facility] who is plastic surgery. He had an appointment earlier this month and according to the patient Dr. Myriam Jacobson does not want to proceed with any attempted flap closure. Although I do not have current access to her note in care everywhere this is likely due to exposed bone. Again according to the patient they did a bone biopsy. He is still using a variant of Dakin solution changing twice a day. He has home health. The patient is not able to give me a firm  answer about how long he spends on this in his wheelchair The patient also states that Dr. Myriam Jacobson wanted to reconsider a wound VAC. I really don't see this as a viable option at least not in the outpatient setting. The wound itself is frankly to close to his anal opening to maintain a seal. The last time we tried to do this home health was unable to manage it. It might be possible to maintain a wound VAC in this setting outside of the home such as a skilled nursing facility or an [place] however I am doubtful about this even in that setting **** READMISSION 09/21/17-He is here for evaluation of stage IV sacral ulcer. Since his last evaluation here in December he has completed treatment for sacral osteomyelitis. He was at [facility] for IV therapy and NPWT dressing changes. He was discharged, with home health services, in February. He admits that while in the skilled facility he had "80%" success with maintaining dressing, since discharge he has had approximately "40%" success with maintaining wound VAC dressing. We discussed at length that this is not a safe or recommended option. We will apply Dakin's wet to dry dressing daily and he will follow-up next week. He is accompanied today by his sister who is willing to assist in dressing changes; they will discuss the social issue as he feels he is capable of changing dressing daily when home health is not able. 09/28/17-He is here in follow-up evaluation for stage IV sacral pressure ulcer. He has been using the Dakin's wet-to-dry daily; he continues with home health. He is not accompanied by anyone at this visit. He will follow up in two weeks per his request/preference. 10/12/17 on evaluation today patient appears to be doing very well. The Dakin solution went to dry packings do seem to be helping him as far as  the sacral wound is concerned I'm not seeing anything that has me more concerned as far as infection or otherwise is concerned. Overall  I'm pleased with the appearance of the wound. 10/26/17-He is here in follow up evaluation for a stage IV sacral ulcer. He continues with daily Dakin's wet-to-dry. He is voicing no complaints or concerns. He will follow-up in 2 weeks 11/16/17-He is here for follow up evaluation for a palliative stage IV sacral pressure ulcer. We will continue with Dakin's wet-to-dry. He will follow- up in 4 weeks. He is expressing concern/complaint regarding new bed that has arrived, stating he is unable to manipulate/maneuver it due to the bed crank being at the foot of the bed. 12/14/17-He is here for evaluation for palliative stage IV sacral ulcer. He is voicing no complaints or concerns. We will continue with one-to-one ratio of saline and dakins. He will follow-up in 3 weeks 01/04/18-He is here in follow-up evaluation for palliative stage IV sacral ulcer. He is inquiring about reinstating the negative pressure wound therapy. We discussed at length that the negative pressure wound therapy in the home setting has not been successful for him repeatedly with loss of cereal and unavailability of 24/7 help; reminded him that home health is not available 24/7 when loss of seal occurs. He does verbalize understanding to this and does not pursue. We also discussed the palliative nature of this ulcer (given no significant change/improvement in measurement/appearance, not a candidate for muscle flap per plastic surgery, and continued independent living) and that the goal is for maintenance, decrease in infection and minimizing/avoiding deterioration given that he is independent in his care, does not have home health and requires daily dressing changes secondary to drainage amount. He is inquiring about a wound clinic in [facility], I have informed him that I am unfamiliar with that clinic but that he is encouraged to seek another opinion if that is his desire. We will continue with dakins and he will follow up in three  weeks 01/25/18-He is here in follow-up evaluation for palliative stage IV sacral ulcer. He continues with Dakin's/saline 1:1 mixture wet to dry dressing changes. He states he has an appointment at [facility] on 9/17 for evaluation of surgical intervention/closure of the sacral ulcer. He will follow-up here in 4 weeks Readmission: 07/16/2019 upon evaluation today patient appears to be doing really about the same as when he was previously seen here in the wound care center. He most recently was a patient of [place] back when she was still working here in the center but had been referred to [facility] for consideration of a flap. With that being said the surgeon there at [facility] stated that this was too large for her flap and they have been attempting to get this smaller in order to be able to proceed with a flap. Nonetheless unfortunately he has had a cycle of going back and forth between the osteomyelitis flaring and then sent him back and then making a little bit of progress only to be sent back again. It sounds like most recently they have been using a Iodoflex type dressing at this point which does not seem to have done any harm by any means. With that being said this wound seems to be quite large for using Iodoflex throughout and subsequently I think he may do much better with the use of Vioxx moistened gauze which would be safe for the new tissue growing and also keep the wound quite nice and clean. The patient is not opposed  to this he in fact states that his home health nurse had mentioned this as a possibility as well. 07/23/2019 upon evaluation today patient actually appears to be doing very well in regard to his sacral wound. In fact this is much healthier the measurements not terribly different but again with a wound like this at home necessarily expect a a lot of change as far as the overall measurements are concerned in just 1 week's time. This is going be a much longer term process at this point. With that  being said I do think that he is very healthy appearing as far as the base of the wound is concerned. 08/06/2019 upon evaluation today patient actually appears to be doing about the same with regard to his wound to be honest. There is really not a significant improvement overall based on what I am seeing today. Fortunately there is no signs of significant systemic infection. No fevers, chills, nausea, vomiting, or diarrhea. With that being said there is odor to the drainage from the wound and subsequently also what appears to be increased drainage based on what we are seeing today as well as what his home health nurse called [place] about that she was concerned with as well. 08/13/2019 upon evaluation today patient appears to be doing about the same at this point with regard to his wound although I think the dressing may be a little less drainage wise compared to what it was previous. Fortunately there is no signs of active infection at this time. No fevers, chills, nausea, vomiting, or diarrhea. He has been taking the antibiotic for only 3 days. 08/23/2019 upon evaluation today patient appears to be doing not nearly as well with regard to his wound. In general really not making a lot of progress which is unfortunate. There does not appear to be any signs of active infection at this time. No fevers, chills, nausea, vomiting, or diarrhea. With that being said the patient unfortunately does seem to be experiencing continued epiboly around the edges of the wound as well as significant scar tissue he has been in the majority of his day sitting in his chair which is likely a big reason for all of this. Fortunately there is no Bradley [name], Bradley A. ([id number]) signs of active infection at this time. No fevers, chills, nausea, vomiting, or diarrhea. 08/29/2019 upon evaluation today patient's wound actually appears to be showing some signs of improvement. The region of few areas of new skin growth around the edges of the  wound even where he has some of the epiboly. This is actually good news and I think that he has been very aggressive and offloading over the past week since we showed him the pictures of his wound last week. There still may be a chance that he is going require some sharp debridement to clear away some of these edges but to be honest I think that is can be quite an undertaking. The main reason is that he has a lot of thickened scar tissue and it is good to bleed quite significantly in my opinion. The I think if organ to do that we may even need to have a portable or disposable cautery in order to make sure that we are able to get the area completely sealed up as far as bleeding is concerned. I do have one that we can utilize. However being that the wound looks so much better right now would like to give this 2 weeks to see how things stand  and look at that point before making any additional recommendations. 09/12/2019 upon evaluation today patient appears to be making some progress as far as offloading is concerned. He still has a substantial wound but nonetheless he tells me he is off loading much more aggressively at this point. This is obviously good news. No fevers, chills, nausea, vomiting, or diarrhea. 4/22; I have not seen this patient in quite some time. No major change in the condition of the wound or its wound volume. Surrounding maceration of the skin around the edges of the wound. The wound is fairly substantial. To close to the anal opening to consider a wound VAC. He had extensive trials of plastic surgery at [facility] which really does not result in any improvement. His wound bed is however clean 10/10/2019 upon evaluation today patient appears to be doing well with regard to the wound in the sacral region. He has been tolerating the dressing changes without complication. Fortunately there is no signs of infection and he actually has some epithelial growth noted as well. He tells me  has been trying to continue to offload this area which is excellent that is what he needs to do most as far as what he can have in his control. Otherwise I feel like the collagen with a saline moistened gauze behind has been beneficial for him. 5/20; collagen and normal saline wet-to-dry. Although the tissue when this large sacral wound looks reasonable there is been absolutely no benefit to the overall closure. He has macerated skin around this. He tells me that he spends a large part of the day up in the wheelchair. He still has home health 11/07/2019 upon evaluation today patient appears to be doing well with regard to his wounds at this point. Fortunately there is no signs of active infection at this time. Overall I feel like his sacral wound in general appears to be doing quite nicely. I am very pleased with overall how things are progressing and I feel like the patient is making excellent progress towards resolution. With that being said he still has a long ways to go before we expect to see this completely healed. 11/21/2019 upon evaluation today patient appears to be doing about the same in regard to his sacral wound. Unfortunately he is really not making much in the way of improvement when I questioned him about how much time he is really spending sitting on his bottom he tells me that the majority of his day is mainly in that position though he does spend some time in bed. It does not sound like it is enough however neither does it look like it is enough. 12/24/2019 upon evaluation today patient appears to be doing really about the same there is no significant improvement overall in his wound bed. Last time he was seen here we did have a discussion about the possibility of seeing if I can get a surgeon to evaluate and potentially clear away some of the thickened edges of the wound in order to allow this wound to potentially have a chance of closing with a wound VAC. Right now we could not even  get a wound VAC to seal with some of the rolled and thickened edges. With that being said elected to the patient to think about this to discuss today. At this point the patient does want to see if I could make the referral to potentially see if the surgeon would be able to perform this and subsequently he agrees to  go to a skilled nursing facility following if they are able to do this in order to allow for a wound VAC and to get this wound to heal more effectively and quickly. In my opinion the only way that I would recommend that he see the surgeon to clear away the edges would be if he is also in agreement with going to skilled nursing facility as I know he cannot appropriately offload at home to take care of this and that setting. 01/07/2020 on evaluation today patient appears to be doing about the same in regard to his wound. There is no signs of active infection at this time which is great news. All in all he is really doing about the same. We have made referral to surgery to see if they can potentially perform debridement to clear away some of the scarred edging of the wound so we could potentially see about a wound VAC to try to help this heal more efficiently. Again we have discussed that if this occurs he would be in a skilled nursing facility following or release would need to be in order to appropriately offload. Nonetheless we are still in the process of working that referral. Objective Constitutional Well-nourished and well-hydrated in no acute distress. Vitals Time Taken: 12:48 PM, Temperature: 98.1 F, Pulse: 81 bpm, Respiratory Rate: 16 breaths/min, Blood Pressure: 122/58 mmHg. Respiratory normal breathing without difficulty. Psychiatric this patient is able to make decisions and demonstrates good insight into disease process. Alert and Oriented x 3. pleasant and cooperative. General Notes: Inspection patient's wound bed actually showed signs of fairly good granulation no signs of  active infection at this time. No fevers, chills, nausea, vomiting, or diarrhea. He still has a lot of scar tissue around the edges of the wound unfortunately. Bradley [name], Bradley [name] ([id number]) Integumentary (Hair, Skin) Wound #10 status is Open. Original cause of wound was Pressure Injury. The wound is located on the Midline Sacrum. The wound measures 12.5cm length x 8.2cm width x 4.8cm depth; 80.503cm^2 area and 386.416cm^3 volume. There is muscle and Fat Layer (Subcutaneous Tissue) Exposed exposed. There is no tunneling or undermining noted. There is a medium amount of serosanguineous drainage noted. The wound margin is epibole. There is large (67-100%) pink granulation within the wound bed. There is a small (1-33%) amount of necrotic tissue within the wound bed including Adherent Slough. Assessment Active Problems ICD-10 Pressure ulcer of sacral region, stage 4 Other chronic osteomyelitis, other site Paraplegia, unspecified Plan Wound Cleansing: Wound #10 Midline Sacrum: Clean wound with Normal Saline. Dial antibacterial soap, wash wounds, rinse and pat dry prior to dressing wounds Primary Wound Dressing: Wound #10 Midline Sacrum: Collagen - in center base of wound moistened with saline Plain packing gauze - wet to dry to fill in space just on midline Secondary Dressing: Wound #10 Midline Sacrum: ABD pad - secure with tape XtraSorb - in office only Other - Carboflex 15cmx20cm Dressing Change Frequency: Wound #10 Midline Sacrum: Change dressing every day. - Patient's son to change when [facility] does not visit Other: - [place] physical therapist to evaluate patient for new Roho cushion (appt scheduled August 20) Follow-up Appointments: Wound #10 Midline Sacrum: Return Appointment in 2 weeks. Off-Loading: Wound #10 Midline Sacrum: Turn and reposition every 2 hours Home Health: Wound #10 Midline Sacrum: Continue Home Health Visits - [place] Nurse may visit PRN to address  patient s wound care needs. FACE TO FACE ENCOUNTER: MEDICARE and MEDICAID PATIENTS: I certify that this patient is  under my care and that I had a face-to-face encounter that meets the physician face-to-face encounter requirements with this patient on this date. The encounter with the patient was in whole or in part for the following MEDICAL CONDITION: (primary reason for [place]) MEDICAL NECESSITY: I certify, that based on my findings, NURSING services are a medically necessary home health service. HOME BOUND STATUS: I certify that my clinical findings support that this patient is homebound (i.e., Due to illness or injury, pt requires aid of supportive devices such as crutches, cane, wheelchairs, walkers, the use of special transportation or the assistance of another person to leave their place of residence. There is a normal inability to leave the home and doing so requires considerable and taxing effort. Other absences are for medical reasons / religious services and are infrequent or of short duration when for other reasons). If current dressing causes regression in wound condition, may D/C ordered dressing product/s and apply Normal Saline Moist Dressing daily until next [place] / Other MD appointment. [place] of regression in wound condition at [phone]. Please direct any NON-WOUND related issues/requests for orders to patient's Primary Care Physician #1. I would recommend currently that we go ahead and initiate a continuation of treatment with collagen into the wound followed by wet-to-dry gauze to fill the space. #2. I am also can recommend that we continue with an ABD pad and XtraSorb over top in order to help with drainage control. #3. I would recommend as well that we continue with the dressing changes which really should be performed daily due to the amount of drainage that he is experiencing. #4. We will still in the process of working on the  referral to the general surgeon to see if they can help with debridement of the wound and especially the wound edges to try to help with more appropriate healing and potential wound VAC application. With that being said I discussed with the patient yet again that if he has the surgery he really needs to go to a skilled nursing facility for recovery following if he has any chance of getting this to heal. Bradley [name], Bradley A. ([id number]) We will see patient back for reevaluation in 2 weeks here in the clinic. If anything worsens or changes patient will contact our office for additional recommendations. Electronic Signature(s) Signed: 01/07/2020 1:39:27 PM By: Worthy Keeler PA-C Entered By: Worthy Keeler on 01/07/2020 13:39:27 Bradley [name] ([id number]) -------------------------------------------------------------------------------- SuperBill Details Patient Name: Bradley [name] Date of Service: 01/07/2020 Medical Record Number: [id number] Patient Account Number: [id number] Date of Birth/Sex: 02/04/1946 (74 y.o. M) Treating RN: Cornell Barman Primary Care Provider: [name] Other Clinician: Referring Provider: [name] Treating Provider/Extender: [name], Franceen Erisman Weeks in Treatment: 25 Diagnosis Coding ICD-10 Codes Code Description L89.154 Pressure ulcer of sacral region, stage 4 M86.68 Other chronic osteomyelitis, other site G82.20 Paraplegia, unspecified Facility Procedures CPT4 Code: 82505397 Description: 99213 - WOUND CARE VISIT-LEV 3 EST PT Modifier: Quantity: 1 Physician Procedures CPT4 Code: 6734193 Description: 79024 - WC PHYS LEVEL 3 - EST PT Modifier: Quantity: 1 CPT4 Code: Description: ICD-10 Diagnosis Description L89.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 

## 2020-01-08 DIAGNOSIS — D509 Iron deficiency anemia, unspecified: Secondary | ICD-10-CM | POA: Diagnosis not present

## 2020-01-08 DIAGNOSIS — S24104S Unspecified injury at T11-T12 level of thoracic spinal cord, sequela: Secondary | ICD-10-CM | POA: Diagnosis not present

## 2020-01-08 DIAGNOSIS — N400 Enlarged prostate without lower urinary tract symptoms: Secondary | ICD-10-CM | POA: Diagnosis not present

## 2020-01-08 DIAGNOSIS — Z933 Colostomy status: Secondary | ICD-10-CM | POA: Diagnosis not present

## 2020-01-08 DIAGNOSIS — N319 Neuromuscular dysfunction of bladder, unspecified: Secondary | ICD-10-CM | POA: Diagnosis not present

## 2020-01-08 DIAGNOSIS — I10 Essential (primary) hypertension: Secondary | ICD-10-CM | POA: Diagnosis not present

## 2020-01-08 DIAGNOSIS — G8220 Paraplegia, unspecified: Secondary | ICD-10-CM | POA: Diagnosis not present

## 2020-01-08 DIAGNOSIS — J309 Allergic rhinitis, unspecified: Secondary | ICD-10-CM | POA: Diagnosis not present

## 2020-01-08 DIAGNOSIS — L89154 Pressure ulcer of sacral region, stage 4: Secondary | ICD-10-CM | POA: Diagnosis not present

## 2020-01-08 NOTE — Progress Notes (Signed)
OTILIO, GROLEAU ([id number]) Visit Report for 01/07/2020 Arrival Information Details Patient Name: CAMRIN, GEARHEART. Date of Service: 01/07/2020 12:30 PM Medical Record Number: [id number] Patient Account Number: [id number] Date of Birth/Sex: [birth year] (74 y.o. M) Treating RN: Cornell Barman Primary Care Izaiah [name]: [name] Other Clinician: Referring Markeise Mathews: [name] Treating Marna Weniger/Extender: [name], HOYT Weeks in Treatment: 25 Visit Information History Since Last Visit Added or deleted any medications: No Patient Arrived: Wheel Chair Any new allergies or adverse reactions: No Arrival Time: 12:47 Had a fall or experienced change in No Accompanied By: self activities of daily living that may affect Transfer Assistance: Harrel Lemon Lift risk of falls: Patient Identification Verified: Yes Signs or symptoms of abuse/neglect since last visito No Secondary Verification Process Completed: Yes Hospitalized since last visit: No Implantable device outside of the clinic excluding No cellular tissue based products placed in the center since last visit: Has Dressing in Place as Prescribed: Yes Pain Present Now: No Electronic Signature(s) Signed: 01/07/2020 4:39:36 PM By: Lorine Bears RCP, RRT, CHT Entered By: Lorine Bears on 01/07/2020 12:47:54 [name] ([id number]) -------------------------------------------------------------------------------- Clinic Level of Care Assessment Details Patient Name: [name] Date of Service: 01/07/2020 12:30 PM Medical Record Number: [id number] Patient Account Number: [id number] Date of Birth/Sex: [birth year] (74 y.o. M) Treating RN: Cornell Barman Primary Care Kimyata Milich: [name] Other Clinician: Referring Estefany Goebel: [name] Treating Crystel Demarco/Extender: [name], HOYT Weeks in Treatment: 25 Clinic Level of Care Assessment Items TOOL 4 Quantity Score []  - Use when only an EandM is  performed on FOLLOW-UP visit 0 ASSESSMENTS - Nursing Assessment / Reassessment X - Reassessment of Co-morbidities (includes updates in patient status) 1 10 X- 1 5 Reassessment of Adherence to Treatment Plan ASSESSMENTS - Wound and Skin Assessment / Reassessment X - Simple Wound Assessment / Reassessment - one wound 1 5 []  - 0 Complex Wound Assessment / Reassessment - multiple wounds []  - 0 Dermatologic / Skin Assessment (not related to wound area) ASSESSMENTS - Focused Assessment []  - Circumferential Edema Measurements - multi extremities 0 []  - 0 Nutritional Assessment / Counseling / Intervention []  - 0 Lower Extremity Assessment (monofilament, tuning fork, pulses) []  - 0 Peripheral Arterial Disease Assessment (using hand held doppler) ASSESSMENTS - Ostomy and/or Continence Assessment and Care []  - Incontinence Assessment and Management 0 []  - 0 Ostomy Care Assessment and Management (repouching, etc.) PROCESS - Coordination of Care X - Simple Patient / Family Education for ongoing care 1 15 []  - 0 Complex (extensive) Patient / Family Education for ongoing care []  - 0 Staff obtains Programmer, systems, Records, Test Results / Process Orders []  - 0 Staff telephones HHA, Nursing Homes / Clarify orders / etc []  - 0 Routine Transfer to another Facility (non-emergent condition) []  - 0 Routine Hospital Admission (non-emergent condition) []  - 0 New Admissions / Biomedical engineer / Ordering NPWT, Apligraf, etc. []  - 0 Emergency Hospital Admission (emergent condition) X- 1 10 Simple Discharge Coordination []  - 0 Complex (extensive) Discharge Coordination PROCESS - Special Needs []  - Pediatric / Minor Patient Management 0 []  - 0 Isolation Patient Management []  - 0 Hearing / Language / Visual special needs []  - 0 Assessment of Community assistance (transportation, D/C planning, etc.) []  - 0 Additional assistance / Altered mentation []  - 0 Support Surface(s) Assessment (bed,  cushion, seat, etc.) INTERVENTIONS - Wound Cleansing / Measurement TEJUAN, GHOLSON A. ([id number]) X- 1 5 Simple Wound Cleansing - one wound []  - 0 Complex Wound Cleansing - multiple  wounds X- 1 5 Wound Imaging (photographs - any number of wounds) []  - 0 Wound Tracing (instead of photographs) X- 1 5 Simple Wound Measurement - one wound []  - 0 Complex Wound Measurement - multiple wounds INTERVENTIONS - Wound Dressings []  - Small Wound Dressing one or multiple wounds 0 []  - 0 Medium Wound Dressing one or multiple wounds X- 1 20 Large Wound Dressing one or multiple wounds []  - 0 Application of Medications - topical []  - 0 Application of Medications - injection INTERVENTIONS - Miscellaneous []  - External ear exam 0 []  - 0 Specimen Collection (cultures, biopsies, blood, body fluids, etc.) []  - 0 Specimen(s) / Culture(s) sent or taken to Lab for analysis X- 1 10 Patient Transfer (multiple staff / Civil Service fast streamer / Similar devices) []  - 0 Simple Staple / Suture removal (25 or less) []  - 0 Complex Staple / Suture removal (26 or more) []  - 0 Hypo / Hyperglycemic Management (close monitor of Blood Glucose) []  - 0 Ankle / Brachial Index (ABI) - do not check if billed separately X- 1 5 Vital Signs Has the patient been seen at the hospital within the last three years: Yes Total Score: 95 Level Of Care: New/Established - Level 3 Electronic Signature(s) Signed: 01/07/2020 7:33:04 PM By: Gretta Cool, BSN, RN, CWS, Kim RN, BSN Entered By: Gretta Cool, BSN, RN, CWS, Kim on 01/07/2020 13:42:11 [name] ([id number]) -------------------------------------------------------------------------------- Encounter Discharge Information Details Patient Name: [name] Date of Service: 01/07/2020 12:30 PM Medical Record Number: [id number] Patient Account Number: [id number] Date of Birth/Sex: [birth year] (74 y.o. M) Treating RN: Cornell Barman Primary Care Taylr Meuth: [name] Other  Clinician: Referring Katena Petitjean: [name] Treating Seren Chaloux/Extender: [name], HOYT Weeks in Treatment: 25 Encounter Discharge Information Items Discharge Condition: Stable Ambulatory Status: Wheelchair Discharge Destination: Home Transportation: Private Auto Accompanied By: self Schedule Follow-up Appointment: Yes Clinical Summary of Care: Electronic Signature(s) Signed: 01/07/2020 1:44:42 PM By: Gretta Cool, BSN, RN, CWS, Kim RN, BSN Entered By: Gretta Cool, BSN, RN, CWS, Kim on 01/07/2020 13:44:42 [name] ([id number]) -------------------------------------------------------------------------------- Lower Extremity Assessment Details Patient Name: WENDELIN, BRADT A. Date of Service: 01/07/2020 12:30 PM Medical Record Number: [id number] Patient Account Number: [id number] Date of Birth/Sex: 11-29-1945 (74 y.o. M) Treating RN: Cornell Barman Primary Care Kennetta Pavlovic: [name] Other Clinician: Referring Kandee Escalante: [name] Treating Christien Berthelot/Extender: Sharalyn Ink in Treatment: 25 Electronic Signature(s) Signed: 01/07/2020 7:33:04 PM By: Gretta Cool, BSN, RN, CWS, Kim RN, BSN Entered By: Gretta Cool, BSN, RN, CWS, Kim on 01/07/2020 13:22:56 [name] ([id number]) -------------------------------------------------------------------------------- Multi Wound Chart Details Patient Name: KAYDAN, WONG A. Date of Service: 01/07/2020 12:30 PM Medical Record Number: [id number] Patient Account Number: [id number] Date of Birth/Sex: 1945/06/23 (74 y.o. M) Treating RN: Cornell Barman Primary Care Carollyn Etcheverry: [name] Other Clinician: Referring Gearl Kimbrough: [name] Treating Mariha Sleeper/Extender: [name], HOYT Weeks in Treatment: 25 Vital Signs Height(in): Pulse(bpm): 1 Weight(lbs): Blood Pressure(mmHg): 122/58 Body Mass Index(BMI): Temperature(F): 98.1 Respiratory Rate(breaths/min): 16 Photos: [N/A:N/A] Wound Location: Midline Sacrum N/A N/A Wounding Event:  Pressure Injury N/A N/A Primary Etiology: Pressure Ulcer N/A N/A Comorbid History: Anemia, Hypertension, History of N/A N/A pressure wounds, Rheumatoid Arthritis, Paraplegia Date Acquired: 06/07/2015 N/A N/A Weeks of Treatment: 25 N/A N/A Wound Status: Open N/A N/A Measurements L x W x D (cm) 12.5x8.2x4.8 N/A N/A Area (cm) : 80.503 N/A N/A Volume (cm) : 386.416 N/A N/A % Reduction in Area: -56.20% N/A N/A % Reduction in Volume: -25.00% N/A N/A Classification: Category/Stage IV N/A N/A Exudate Amount: Medium  N/A N/A Exudate Type: Serosanguineous N/A N/A Exudate Color: red, brown N/A N/A Wound Margin: Epibole N/A N/A Granulation Amount: Large (67-100%) N/A N/A Granulation Quality: Pink N/A N/A Necrotic Amount: Small (1-33%) N/A N/A Exposed Structures: Fat Layer (Subcutaneous Tissue) N/A N/A Exposed: Yes Muscle: Yes Fascia: No Tendon: No Joint: No Bone: No Epithelialization: None N/A N/A Treatment Notes Electronic Signature(s) Signed: 01/07/2020 7:33:04 PM By: Gretta Cool, BSN, RN, CWS, Kim RN, BSN Entered By: Gretta Cool, BSN, RN, CWS, Kim on 01/07/2020 13:24:49 [name] ([id number]) -------------------------------------------------------------------------------- Anza Details Patient Name: TODDRICK, SANNA A. Date of Service: 01/07/2020 12:30 PM Medical Record Number: [id number] Patient Account Number: [id number] Date of Birth/Sex: 09/24/1945 (74 y.o. M) Treating RN: Cornell Barman Primary Care Jahnya Trindade: [name] Other Clinician: Referring Esther Broyles: [name] Treating Mende Biswell/Extender: [name], HOYT Weeks in Treatment: 25 Active Inactive Abuse / Safety / Falls / Self Care Management Nursing Diagnoses: Impaired physical mobility Goals: Patient will not develop complications from immobility Date Initiated: 07/16/2019 Target Resolution Date: 02/07/2020 Goal Status: Active Interventions: Assess Activities of Daily Living upon admission and as  needed Notes: Pressure Nursing Diagnoses: Knowledge deficit related to causes and risk factors for pressure ulcer development Goals: Patient will remain free from development of additional pressure ulcers Date Initiated: 07/16/2019 Target Resolution Date: 02/07/2020 Goal Status: Active Interventions: Provide education on pressure ulcers Notes: Electronic Signature(s) Signed: 01/07/2020 7:33:04 PM By: Gretta Cool, BSN, RN, CWS, Kim RN, BSN Entered By: Gretta Cool, BSN, RN, CWS, Kim on 01/07/2020 13:24:41 [name] ([id number]) -------------------------------------------------------------------------------- Pain Assessment Details Patient Name: [name] Date of Service: 01/07/2020 12:30 PM Medical Record Number: [id number] Patient Account Number: [id number] Date of Birth/Sex: [birth year] (74 y.o. M) Treating RN: Cornell Barman Primary Care Erikah Thumm: [name] Other Clinician: Referring Shameca Landen: [name] Treating Leiya Keesey/Extender: [name], HOYT Weeks in Treatment: 25 Active Problems Location of Pain Severity and Description of Pain Patient Has Paino No Site Locations Pain Management and Medication Current Pain Management: Electronic Signature(s) Signed: 01/07/2020 5:13:06 PM By: Levan Hurst RN, BSN Signed: 01/07/2020 7:33:04 PM By: Gretta Cool, BSN, RN, CWS, Kim RN, BSN Entered By: Levan Hurst on 01/07/2020 13:03:32 [name] ([id number]) -------------------------------------------------------------------------------- Patient/Caregiver Education Details Patient Name: NOVA, EVETT A. Date of Service: 01/07/2020 12:30 PM Medical Record Number: [id number] Patient Account Number: [id number] Date of Birth/Gender: 02/09/1946 (74 y.o. M) Treating RN: Cornell Barman Primary Care Physician: [name] Other Clinician: Referring Physician: [name] Treating Physician/Extender: Sharalyn Ink in Treatment: 25 Education Assessment Education Provided  To: Patient Education Topics Provided Wound/Skin Impairment: Handouts: Caring for Your Ulcer Methods: Demonstration, Explain/Verbal Responses: State content correctly Electronic Signature(s) Signed: 01/07/2020 7:33:04 PM By: Gretta Cool, BSN, RN, CWS, Kim RN, BSN Entered By: Gretta Cool, BSN, RN, CWS, Kim on 01/07/2020 13:43:56 [name] ([id number]) -------------------------------------------------------------------------------- Wound Assessment Details Patient Name: LUDWIG, TUGWELL A. Date of Service: 01/07/2020 12:30 PM Medical Record Number: [id number] Patient Account Number: [id number] Date of Birth/Sex: Jul 02, 1945 (74 y.o. M) Treating RN: Cornell Barman Primary Care Derek Laughter: [name] Other Clinician: Referring Avery Eustice: [name] Treating Seabron Iannello/Extender: [name], HOYT Weeks in Treatment: 25 Wound Status Wound Number: 10 Primary Pressure Ulcer Etiology: Wound Location: Midline Sacrum Wound Open Wounding Event: Pressure Injury Status: Date Acquired: 06/07/2015 Comorbid Anemia, Hypertension, History of pressure wounds, Weeks Of Treatment: 25 History: Rheumatoid Arthritis, Paraplegia Clustered Wound: No Photos Wound Measurements Length: (cm) 12.5 % Reduct Width: (cm) 8.2 % Reduct Depth: (cm) 4.8 Epitheli Area: (cm) 80.503 Tunneli Volume: (cm) 386.416 Undermi ion  in Area: -56.2% ion in Volume: -25% alization: None ng: No ning: No Wound Description Classification: Category/Stage IV Foul Odo Wound Margin: Epibole Slough/F Exudate Amount: Medium Exudate Type: Serosanguineous Exudate Color: red, brown r After Cleansing: No ibrino Yes Wound Bed Granulation Amount: Large (67-100%) Exposed Structure Granulation Quality: Pink Fascia Exposed: No Necrotic Amount: Small (1-33%) Fat Layer (Subcutaneous Tissue) Exposed: Yes Necrotic Quality: Adherent Slough Tendon Exposed: No Muscle Exposed: Yes Necrosis of Muscle: No Joint Exposed: No Bone Exposed:  No Treatment Notes Wound #10 (Midline Sacrum) Notes prisma, wet to dry, ABd, xsorb ELZY, TOMASELLO ([id number]) Electronic Signature(s) Signed: 01/07/2020 5:13:06 PM By: Levan Hurst RN, BSN Signed: 01/07/2020 7:33:04 PM By: Gretta Cool, BSN, RN, CWS, Kim RN, BSN Entered By: Levan Hurst on 01/07/2020 13:13:47 [name] ([id number]) -------------------------------------------------------------------------------- Vitals Details Patient Name: [name] Date of Service: 01/07/2020 12:30 PM Medical Record Number: [id number] Patient Account Number: [id number] Date of Birth/Sex: September 26, 1945 (74 y.o. M) Treating RN: Cornell Barman Primary Care Chesney Suares: [name] Other Clinician: Referring Daylynn Stumpp: [name] Treating Clovis Warwick/Extender: [name], HOYT Weeks in Treatment: 25 Vital Signs Time Taken: 12:48 Temperature (F): 98.1 Pulse (bpm): 81 Respiratory Rate (breaths/min): 16 Blood Pressure (mmHg): 122/58 Reference Range: 80 - 120 mg / dl Electronic Signature(s) Signed: 01/07/2020 4:39:36 PM By: Lorine Bears RCP, RRT, CHT Entered By: Lorine Bears on 01/07/2020 12:52:17

## 2020-01-10 DIAGNOSIS — Z933 Colostomy status: Secondary | ICD-10-CM | POA: Diagnosis not present

## 2020-01-10 DIAGNOSIS — J309 Allergic rhinitis, unspecified: Secondary | ICD-10-CM | POA: Diagnosis not present

## 2020-01-10 DIAGNOSIS — L89154 Pressure ulcer of sacral region, stage 4: Secondary | ICD-10-CM | POA: Diagnosis not present

## 2020-01-10 DIAGNOSIS — I10 Essential (primary) hypertension: Secondary | ICD-10-CM | POA: Diagnosis not present

## 2020-01-10 DIAGNOSIS — S24104S Unspecified injury at T11-T12 level of thoracic spinal cord, sequela: Secondary | ICD-10-CM | POA: Diagnosis not present

## 2020-01-10 DIAGNOSIS — N319 Neuromuscular dysfunction of bladder, unspecified: Secondary | ICD-10-CM | POA: Diagnosis not present

## 2020-01-10 DIAGNOSIS — N400 Enlarged prostate without lower urinary tract symptoms: Secondary | ICD-10-CM | POA: Diagnosis not present

## 2020-01-10 DIAGNOSIS — D509 Iron deficiency anemia, unspecified: Secondary | ICD-10-CM | POA: Diagnosis not present

## 2020-01-10 DIAGNOSIS — G8220 Paraplegia, unspecified: Secondary | ICD-10-CM | POA: Diagnosis not present

## 2020-01-13 DIAGNOSIS — G8220 Paraplegia, unspecified: Secondary | ICD-10-CM | POA: Diagnosis not present

## 2020-01-13 DIAGNOSIS — S24104S Unspecified injury at T11-T12 level of thoracic spinal cord, sequela: Secondary | ICD-10-CM | POA: Diagnosis not present

## 2020-01-13 DIAGNOSIS — D509 Iron deficiency anemia, unspecified: Secondary | ICD-10-CM | POA: Diagnosis not present

## 2020-01-13 DIAGNOSIS — L89154 Pressure ulcer of sacral region, stage 4: Secondary | ICD-10-CM | POA: Diagnosis not present

## 2020-01-13 DIAGNOSIS — J309 Allergic rhinitis, unspecified: Secondary | ICD-10-CM | POA: Diagnosis not present

## 2020-01-13 DIAGNOSIS — I10 Essential (primary) hypertension: Secondary | ICD-10-CM | POA: Diagnosis not present

## 2020-01-13 DIAGNOSIS — N319 Neuromuscular dysfunction of bladder, unspecified: Secondary | ICD-10-CM | POA: Diagnosis not present

## 2020-01-13 DIAGNOSIS — Z79899 Other long term (current) drug therapy: Secondary | ICD-10-CM | POA: Diagnosis not present

## 2020-01-13 DIAGNOSIS — N400 Enlarged prostate without lower urinary tract symptoms: Secondary | ICD-10-CM | POA: Diagnosis not present

## 2020-01-14 DIAGNOSIS — L89153 Pressure ulcer of sacral region, stage 3: Secondary | ICD-10-CM | POA: Diagnosis not present

## 2020-01-15 DIAGNOSIS — L89154 Pressure ulcer of sacral region, stage 4: Secondary | ICD-10-CM | POA: Diagnosis not present

## 2020-01-15 DIAGNOSIS — J309 Allergic rhinitis, unspecified: Secondary | ICD-10-CM | POA: Diagnosis not present

## 2020-01-15 DIAGNOSIS — D509 Iron deficiency anemia, unspecified: Secondary | ICD-10-CM | POA: Diagnosis not present

## 2020-01-15 DIAGNOSIS — N400 Enlarged prostate without lower urinary tract symptoms: Secondary | ICD-10-CM | POA: Diagnosis not present

## 2020-01-15 DIAGNOSIS — I10 Essential (primary) hypertension: Secondary | ICD-10-CM | POA: Diagnosis not present

## 2020-01-15 DIAGNOSIS — G8220 Paraplegia, unspecified: Secondary | ICD-10-CM | POA: Diagnosis not present

## 2020-01-15 DIAGNOSIS — Z79899 Other long term (current) drug therapy: Secondary | ICD-10-CM | POA: Diagnosis not present

## 2020-01-15 DIAGNOSIS — N319 Neuromuscular dysfunction of bladder, unspecified: Secondary | ICD-10-CM | POA: Diagnosis not present

## 2020-01-15 DIAGNOSIS — S24104S Unspecified injury at T11-T12 level of thoracic spinal cord, sequela: Secondary | ICD-10-CM | POA: Diagnosis not present

## 2020-01-17 DIAGNOSIS — N319 Neuromuscular dysfunction of bladder, unspecified: Secondary | ICD-10-CM | POA: Diagnosis not present

## 2020-01-17 DIAGNOSIS — Z79899 Other long term (current) drug therapy: Secondary | ICD-10-CM | POA: Diagnosis not present

## 2020-01-17 DIAGNOSIS — D509 Iron deficiency anemia, unspecified: Secondary | ICD-10-CM | POA: Diagnosis not present

## 2020-01-17 DIAGNOSIS — J309 Allergic rhinitis, unspecified: Secondary | ICD-10-CM | POA: Diagnosis not present

## 2020-01-17 DIAGNOSIS — S24104S Unspecified injury at T11-T12 level of thoracic spinal cord, sequela: Secondary | ICD-10-CM | POA: Diagnosis not present

## 2020-01-17 DIAGNOSIS — L89154 Pressure ulcer of sacral region, stage 4: Secondary | ICD-10-CM | POA: Diagnosis not present

## 2020-01-17 DIAGNOSIS — N400 Enlarged prostate without lower urinary tract symptoms: Secondary | ICD-10-CM | POA: Diagnosis not present

## 2020-01-17 DIAGNOSIS — G8220 Paraplegia, unspecified: Secondary | ICD-10-CM | POA: Diagnosis not present

## 2020-01-17 DIAGNOSIS — I10 Essential (primary) hypertension: Secondary | ICD-10-CM | POA: Diagnosis not present

## 2020-01-20 DIAGNOSIS — D509 Iron deficiency anemia, unspecified: Secondary | ICD-10-CM | POA: Diagnosis not present

## 2020-01-20 DIAGNOSIS — N319 Neuromuscular dysfunction of bladder, unspecified: Secondary | ICD-10-CM | POA: Diagnosis not present

## 2020-01-20 DIAGNOSIS — S24104S Unspecified injury at T11-T12 level of thoracic spinal cord, sequela: Secondary | ICD-10-CM | POA: Diagnosis not present

## 2020-01-20 DIAGNOSIS — I10 Essential (primary) hypertension: Secondary | ICD-10-CM | POA: Diagnosis not present

## 2020-01-20 DIAGNOSIS — N400 Enlarged prostate without lower urinary tract symptoms: Secondary | ICD-10-CM | POA: Diagnosis not present

## 2020-01-20 DIAGNOSIS — L89154 Pressure ulcer of sacral region, stage 4: Secondary | ICD-10-CM | POA: Diagnosis not present

## 2020-01-20 DIAGNOSIS — G8220 Paraplegia, unspecified: Secondary | ICD-10-CM | POA: Diagnosis not present

## 2020-01-20 DIAGNOSIS — Z79899 Other long term (current) drug therapy: Secondary | ICD-10-CM | POA: Diagnosis not present

## 2020-01-20 DIAGNOSIS — J309 Allergic rhinitis, unspecified: Secondary | ICD-10-CM | POA: Diagnosis not present

## 2020-01-21 ENCOUNTER — Other Ambulatory Visit: Payer: Self-pay

## 2020-01-21 ENCOUNTER — Encounter: Payer: Medicare HMO | Admitting: Physician Assistant

## 2020-01-21 DIAGNOSIS — M8668 Other chronic osteomyelitis, other site: Secondary | ICD-10-CM | POA: Diagnosis not present

## 2020-01-21 DIAGNOSIS — M069 Rheumatoid arthritis, unspecified: Secondary | ICD-10-CM | POA: Diagnosis not present

## 2020-01-21 DIAGNOSIS — L89153 Pressure ulcer of sacral region, stage 3: Secondary | ICD-10-CM | POA: Diagnosis not present

## 2020-01-21 DIAGNOSIS — L89154 Pressure ulcer of sacral region, stage 4: Secondary | ICD-10-CM | POA: Diagnosis not present

## 2020-01-21 DIAGNOSIS — G8220 Paraplegia, unspecified: Secondary | ICD-10-CM | POA: Diagnosis not present

## 2020-01-21 DIAGNOSIS — L89159 Pressure ulcer of sacral region, unspecified stage: Secondary | ICD-10-CM | POA: Diagnosis not present

## 2020-01-21 DIAGNOSIS — Z933 Colostomy status: Secondary | ICD-10-CM | POA: Diagnosis not present

## 2020-01-21 NOTE — Progress Notes (Addendum)
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 curet there is definitely a gritty surface to this. We use Medihoney for a while, he could not afford Santyl. I don't think we could get a supply of Iodoflex. He talks a little more positively about the concept of plastic surgery which I've gone over with him today 08/31/16;; patient arrives in clinic today with the wound surface really no different there is no changes in dimensions. I debrided today surface on the left upper side of this wound aggressively week ago  there is no real change here no evidence of epithelialization. The problem with debridement in the clinic is that he believes from this very liberally. We have been using Sorbact. 09/21/16; absolutely no change in the appearance or measurements of this wound. More recently I've been debrided in this aggressively and using sorbact to see if we could get to a better wound surface. Although this visually looks satisfactory, debridement reveals a very gritty surface to this. However even with this debridement and removal of thick nonviable skin and subcutaneous tissue from around the large amount of the circumference of this wound we have made absolutely no progress. This may be an offloading issue I'm just not completely certain. It has 2 close proximity in its inferior aspect to consider  negative pressure therapy 10/26/16; READMISSION This patient called our clinic yesterday to report an odor in his wound. He had been to see plastic surgery at [facility] at our request after his last visit on 09/21/16; we have been seeing him for several months with a large stage III wound. He had been sent to general surgery for consideration of a colostomy, that appointment was not until mid June He comes in today with a temperature of 101. He is reporting an odor in the wound since last weekend. 01/10/17 Readmission: 01/10/17 On evaluation today it is noted that patient has been seen by plastic surgery at [facility] since he was last evaluated here. They did discuss with him the possibility of a flap according to the notes but unfortunately at this point he was not quite ready to proceed with surgery and instead wanted to give the Wound VAC a try. In the hospital they were able to get a good seal on the Wound VAC. Unfortunately since that time they have been having trouble in regard to his current home health company keeping a simple on the Wound VAC. He would like to switch to a different home health company. With that being said it  sounds as if the problem is that his wound VAC is not feeling at the lower portion of his back and he tells me that he can take some of the clear plastic and put over that area when the sill breaks and it will correct it for time. He has no discomfort or pain which is good news. He has been treated with IV vancomycin since he was last seen here and has an appointment with a infectious disease specialist in two days on 01/12/17. Otherwise he was transferred back to [place] for continuing to monitor and manage is wound as she progresses with a Wound VAC for the time being. 01/17/17 on evaluation today patient continues to show evidence of slight improvement with the Wound VAC fortunately there's no evidence of infection or otherwise worsening condition in general. Nonetheless we were unable to get him switch to advanced homecare in regard to home help from his current company. I'm not sure the reasoning behind but for some reason he was not accepted as a patient with him. Continue to apply the Wound VAC which does still show that some maceration around the wound edges but the wound measurements were slightly improved. No fevers, chills, nausea, or vomiting noted at this time. 02/14/17; this patient I have not seen in 5 months although he has been readmitted to our clinic seen by our physician assistant Jeri Cos twice in early August. I have looked through [facility] notes care everywhere. The patient saw plastic surgery in May [Dr. Bhatt}. The patient was sent to general surgery and ultimately had a colostomy placed. On 11/29/16. This was after he was admitted to [facility] sometime in May. An MRI of the pelvis on 5/23 showed osteomyelitis of the coccyx. An attempt was made to drain fluid that was not successful. He was treated with empiric broad-spectrum antibiotics VAC/cefepime/Flagyl starting on 11/02/16 with plans for a 6 week course. According to their notes he was sent to a nursing home. Was last seen by Dr. Myriam Jacobson of  plastic surgery on 12/28/16. The first part of the note is a long dissertation about the difficulties finding adequate patients for flap closure of pressure ulcers. At that time the wound was noted to be stage IV based I think on underlying infection no exposed bone and  healthy granulation tissue. 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 He was last seen by infectious disease 01/12/17 A Dr. Uvaldo Rising. His note says that Mr. Egelston was not interested in a flap closure for referring a trial of the wound VAC. As previously anticipated the wound VAC could not be maintained as an outpatient in the community. He is now using something similar to a Dakin's wet to dry recommended by [facility] VASHE solution. He is placing this twice a day himself. This is almost s hopeless setting in terms of heeling 02/28/17; he is using a Dakin's wet to dry. Most of the wound surface looks satisfactory however the deeper area over his coccyx now has exposed bone I'm not sure if I noted this last week. 03/21/17; patient is usingVASHE solution wet to dry which I gather is a variation on Dakin solution. He has home health changing this 3 times a week the other days he does this himself. His appointment with plastic surgery 04/18/17; patient continues to use a variant of Dakin solution I believe. His wound continues to have a clean viable surface. The 2 areas of exposed bone in the center of this wound had closed over. He has an appointment with plastic surgery on December 5 at which time I hope that there'll be a plan for myocutaneous flap closure In looking through [place] link I couldn't find any more plastic surgery appointments. I did come across the fact that he is been followed by Bradley [name] ([id number]) hematology for a microcytic hypochromic anemia. He had a reasonably normal looking hemoglobin electrophoresis. His iron level was 10 and according to the patient he is going for  IV iron infusions starting tomorrow. He had a sedimentation rate of 74. More problematically from a pure wound care point of view his albumin was 2.7 earlier this month 05/17/17; this is a patient I follow monthly. He has a large now stage IV wound over his bilateral buttocks with close proximity to his anal opening. More recently he has developed a large area with exposed bone in the center of this probably secondary to the underlying osteomyelitis E had in the summer. He also follows with Dr. Myriam Jacobson at [facility] who is plastic surgery. He had an appointment earlier this month and according to the patient Dr. Myriam Jacobson does not want to proceed with any attempted flap closure. Although I do not have current access to her note in care everywhere this is likely due to exposed bone. Again according to the patient they did a bone biopsy. He is still using a variant of Dakin solution changing twice a day. He has home health. The patient is not able to give me a firm answer about how long he spends on this in his wheelchair The patient also states that Dr. Myriam Jacobson wanted to reconsider a wound VAC. I really don't see this as a viable option at least not in the outpatient setting. The wound itself is frankly to close to his anal opening to maintain a seal. The last time we tried to do this home health was unable to manage it. It might be possible to maintain a wound VAC in this setting outside of the home such as a skilled nursing facility or an [place] however I am doubtful about this even in that setting **** READMISSION 09/21/17-He is here for evaluation of stage IV sacral ulcer. Since his last evaluation here in December he has completed treatment for sacral  osteomyelitis. He was at [facility] for IV therapy and NPWT dressing changes. He was discharged, with home health services, in February. He admits that while in the skilled facility he had "80%" success with maintaining dressing, since discharge he has  had approximately "40%" success with maintaining wound VAC dressing. We discussed at length that this is not a safe or recommended option. We will apply Dakin's wet to dry dressing daily and he will follow-up next week. He is accompanied today by his sister who is willing to assist in dressing changes; they will discuss the social issue as he feels he is capable of changing dressing daily when home health is not able. 09/28/17-He is here in follow-up evaluation for stage IV sacral pressure ulcer. He has been using the Dakin's wet-to-dry daily; he continues with home health. He is not accompanied by anyone at this visit. He will follow up in two weeks per his request/preference. 10/12/17 on evaluation today patient appears to be doing very well. The Dakin solution went to dry packings do seem to be helping him as far as the sacral wound is concerned I'm not seeing anything that has me more concerned as far as infection or otherwise is concerned. Overall I'm pleased with the appearance of the wound. 10/26/17-He is here in follow up evaluation for a stage IV sacral ulcer. He continues with daily Dakin's wet-to-dry. He is voicing no complaints or concerns. He will follow-up in 2 weeks 11/16/17-He is here for follow up evaluation for a palliative stage IV sacral pressure ulcer. We will continue with Dakin's wet-to-dry. He will follow- up in 4 weeks. He is expressing concern/complaint regarding new bed that has arrived, stating he is unable to manipulate/maneuver it due to the bed crank being at the foot of the bed. 12/14/17-He is here for evaluation for palliative stage IV sacral ulcer. He is voicing no complaints or concerns. We will continue with one-to-one ratio of saline and dakins. He will follow-up in 3 weeks 01/04/18-He is here in follow-up evaluation for palliative stage IV sacral ulcer. He is inquiring about reinstating the negative pressure wound therapy. We discussed at length that the negative  pressure wound therapy in the home setting has not been successful for him repeatedly with loss of cereal and unavailability of 24/7 help; reminded him that home health is not available 24/7 when loss of seal occurs. He does verbalize understanding to this and does not pursue. We also discussed the palliative nature of this ulcer (given no significant change/improvement in measurement/appearance, not a candidate for muscle flap per plastic surgery, and continued independent living) and that the goal is for maintenance, decrease in infection and minimizing/avoiding deterioration given that he is independent in his care, does not have home health and requires daily dressing changes secondary to drainage amount. He is inquiring about a wound clinic in [facility], I have informed him that I am unfamiliar with that clinic but that he is encouraged to seek another opinion if that is his desire. We will continue with dakins and he will follow up in three weeks 01/25/18-He is here in follow-up evaluation for palliative stage IV sacral ulcer. He continues with Dakin's/saline 1:1 mixture wet to dry dressing changes. He states he has an appointment at [facility] on 9/17 for evaluation of surgical intervention/closure of the sacral ulcer. He will follow-up here in 4 weeks Readmission: 07/16/2019 upon evaluation today patient appears to be doing really about the same as when he was previously seen here in the  wound care center. He most recently was a patient of [place] back when she was still working here in the center but had been referred to [facility] for consideration of a flap. With that being said the surgeon there at [facility] stated that this was too large for her flap and they have been attempting to get this smaller in order to be able to proceed with a flap. Nonetheless unfortunately he has had a cycle of going back and forth between the osteomyelitis flaring and then sent him back and then making a little bit of progress only to be  sent back again. It sounds like most recently they have been using a Iodoflex type dressing at this point which does not seem to have done any harm by any means. With that being said this wound seems to be quite large for using Iodoflex throughout and subsequently I think he may do much better with the use of Vioxx moistened gauze which would be safe for the new tissue growing and also keep the wound quite nice and clean. The patient is not opposed to this he in fact states that his home health nurse had mentioned this as a possibility as well. 07/23/2019 upon evaluation today patient actually appears to be doing very well in regard to his sacral wound. In fact this is much healthier the measurements not terribly different but again with a wound like this at home necessarily expect a a lot of change as far as the overall measurements are concerned in just 1 week's time. This is going be a much longer term process at this point. With that being said I do think that he is very healthy appearing as far as the base of the wound is concerned. 08/06/2019 upon evaluation today patient actually appears to be doing about the same with regard to his wound to be honest. There is really not a significant improvement overall based on what I am seeing today. Fortunately there is no signs of significant systemic infection. No fevers, chills, nausea, vomiting, or diarrhea. With that being said there is odor to the drainage from the wound and subsequently also what appears to be increased drainage based on what we are seeing today as well as what his home health nurse called [place] about that she was concerned with as well. 08/13/2019 upon evaluation today patient appears to be doing about the same at this point with regard to his wound although I think the dressing may be a little less drainage wise compared to what it was previous. Fortunately there is no signs of active infection at this time. No fevers, chills, nausea,  vomiting, or diarrhea. He has been taking the antibiotic for only 3 days. 08/23/2019 upon evaluation today patient appears to be doing not nearly as well with regard to his wound. In general really not making a lot of progress which is unfortunate. There does not appear to be any signs of active infection at this time. No fevers, chills, nausea, vomiting, or diarrhea. With that being said the patient unfortunately does seem to be experiencing continued epiboly around the edges of the wound as well as significant scar tissue he has been in the majority of his day sitting in his chair which is likely a big reason for all of this. Fortunately there is no Bradley [name], Bradley A. ([id number]) signs of active infection at this time. No fevers, chills, nausea, vomiting, or diarrhea. 08/29/2019 upon evaluation today patient's wound actually appears to be showing some signs  of improvement. The region of few areas of new skin growth around the edges of the wound even where he has some of the epiboly. This is actually good news and I think that he has been very aggressive and offloading over the past week since we showed him the pictures of his wound last week. There still may be a chance that he is going require some sharp debridement to clear away some of these edges but to be honest I think that is can be quite an undertaking. The main reason is that he has a lot of thickened scar tissue and it is good to bleed quite significantly in my opinion. The I think if organ to do that we may even need to have a portable or disposable cautery in order to make sure that we are able to get the area completely sealed up as far as bleeding is concerned. I do have one that we can utilize. However being that the wound looks so much better right now would like to give this 2 weeks to see how things stand and look at that point before making any additional recommendations. 09/12/2019 upon evaluation today patient appears to be making  some progress as far as offloading is concerned. He still has a substantial wound but nonetheless he tells me he is off loading much more aggressively at this point. This is obviously good news. No fevers, chills, nausea, vomiting, or diarrhea. 4/22; I have not seen this patient in quite some time. No major change in the condition of the wound or its wound volume. Surrounding maceration of the skin around the edges of the wound. The wound is fairly substantial. To close to the anal opening to consider a wound VAC. He had extensive trials of plastic surgery at [facility] which really does not result in any improvement. His wound bed is however clean 10/10/2019 upon evaluation today patient appears to be doing well with regard to the wound in the sacral region. He has been tolerating the dressing changes without complication. Fortunately there is no signs of infection and he actually has some epithelial growth noted as well. He tells me has been trying to continue to offload this area which is excellent that is what he needs to do most as far as what he can have in his control. Otherwise I feel like the collagen with a saline moistened gauze behind has been beneficial for him. 5/20; collagen and normal saline wet-to-dry. Although the tissue when this large sacral wound looks reasonable there is been absolutely no benefit to the overall closure. He has macerated skin around this. He tells me that he spends a large part of the day up in the wheelchair. He still has home health 11/07/2019 upon evaluation today patient appears to be doing well with regard to his wounds at this point. Fortunately there is no signs of active infection at this time. Overall I feel like his sacral wound in general appears to be doing quite nicely. I am very pleased with overall how things are progressing and I feel like the patient is making excellent progress towards resolution. With that being said he still has a long ways  to go before we expect to see this completely healed. 11/21/2019 upon evaluation today patient appears to be doing about the same in regard to his sacral wound. Unfortunately he is really not making much in the way of improvement when I questioned him about how much time he is really spending  sitting on his bottom he tells me that the majority of his day is mainly in that position though he does spend some time in bed. It does not sound like it is enough however neither does it look like it is enough. 12/24/2019 upon evaluation today patient appears to be doing really about the same there is no significant improvement overall in his wound bed. Last time he was seen here we did have a discussion about the possibility of seeing if I can get a surgeon to evaluate and potentially clear away some of the thickened edges of the wound in order to allow this wound to potentially have a chance of closing with a wound VAC. Right now we could not even get a wound VAC to seal with some of the rolled and thickened edges. With that being said elected to the patient to think about this to discuss today. At this point the patient does want to see if I could make the referral to potentially see if the surgeon would be able to perform this and subsequently he agrees to go to a skilled nursing facility following if they are able to do this in order to allow for a wound VAC and to get this wound to heal more effectively and quickly. In my opinion the only way that I would recommend that he see the surgeon to clear away the edges would be if he is also in agreement with going to skilled nursing facility as I know he cannot appropriately offload at home to take care of this and that setting. 01/07/2020 on evaluation today patient appears to be doing about the same in regard to his wound. There is no signs of active infection at this time which is great news. All in all he is really doing about the same. We have made referral  to surgery to see if they can potentially perform debridement to clear away some of the scarred edging of the wound so we could potentially see about a wound VAC to try to help this heal more efficiently. Again we have discussed that if this occurs he would be in a skilled nursing facility following or release would need to be in order to appropriately offload. Nonetheless we are still in the process of working that referral. 01/21/2020 upon evaluation today patient appears to be doing about the same in regard to his sacral wound. He did see Dr. Lysle Pearl Dr. Ines Bloomer feeling was that there was not anything surgically he could offer at this point as he did not feel like that a wound VAC would likely be appropriate for the patient. With that being said he did recommend potential for trying to get the patient into a skilled nursing facility as an outpatient and they will get me working on that as well. Fortunately there is no signs of active infection at this time. Objective Constitutional Well-nourished and well-hydrated in no acute distress. Vitals Time Taken: 1:50 AM, Temperature: 98.6 F, Pulse: 81 bpm, Respiratory Rate: 18 breaths/min, Blood Pressure: 142/56 mmHg. Respiratory normal breathing without difficulty. Psychiatric this patient is able to make decisions and demonstrates good insight into disease process. Alert and Oriented x 3. pleasant and cooperative. MARTINE, TRAGESER ([id number]) General Notes: Upon inspection patient's wound actually showed signs of good granulation at this point for the most part. He does have some rolled edges. With that being said this really is not something that I can undertake here in the clinic to try to debride this away.  I did discuss with him the possibility of possibly using a DuoDERM dressing over the anal opening and then subsequently just draping over the entire region for the wound VAC in order to seal things up and prevent any injury he does have a  colostomy. With that being said the patient is not in disagreement with this but he still would need to be admitted to a skilled nursing facility in order to use this as he would need to be appropriately offloaded as well. That still a hinging factor here for [place]. Integumentary (Hair, Skin) Wound #10 status is Open. Original cause of wound was Pressure Injury. The wound is located on the Midline Sacrum. The wound measures 12cm length x 8.5cm width x 6cm depth; 80.111cm^2 area and 480.664cm^3 volume. There is muscle and Fat Layer (Subcutaneous Tissue) Exposed exposed. There is a medium amount of serosanguineous drainage noted. The wound margin is epibole. There is large (67-100%) pink granulation within the wound bed. There is a small (1-33%) amount of necrotic tissue within the wound bed including Adherent Slough. Assessment Active Problems ICD-10 Pressure ulcer of sacral region, stage 4 Other chronic osteomyelitis, other site Paraplegia, unspecified Plan Wound Cleansing: Wound #10 Midline Sacrum: Clean wound with Normal Saline. Dial antibacterial soap, wash wounds, rinse and pat dry prior to dressing wounds Primary Wound Dressing: Wound #10 Midline Sacrum: Collagen - in center base of wound moistened with saline Plain packing gauze - wet to dry to fill in space just on midline Secondary Dressing: Wound #10 Midline Sacrum: ABD pad - secure with tape XtraSorb - in office only Other - Carboflex 15cmx20cm Dressing Change Frequency: Wound #10 Midline Sacrum: Change dressing every day. - Patient's son to change when [facility] does not visit Other: - [place] physical therapist to evaluate patient for new Roho cushion (appt scheduled August 20) Follow-up Appointments: Wound #10 Midline Sacrum: Return Appointment in 2 weeks. Off-Loading: Wound #10 Midline Sacrum: Turn and reposition every 2 hours Home Health: Wound #10 Midline Sacrum: Continue Home Health Visits - [place] Nurse may  visit PRN to address patient s wound care needs. FACE TO FACE ENCOUNTER: MEDICARE and MEDICAID PATIENTS: I certify that this patient is under my care and that I had a face-to-face encounter that meets the physician face-to-face encounter requirements with this patient on this date. The encounter with the patient was in whole or in part for the following MEDICAL CONDITION: (primary reason for [place]) MEDICAL NECESSITY: I certify, that based on my findings, NURSING services are a medically necessary home health service. HOME BOUND STATUS: I certify that my clinical findings support that this patient is homebound (i.e., Due to illness or injury, pt requires aid of supportive devices such as crutches, cane, wheelchairs, walkers, the use of special transportation or the assistance of another person to leave their place of residence. There is a normal inability to leave the home and doing so requires considerable and taxing effort. Other absences are for medical reasons / religious services and are infrequent or of short duration when for other reasons). If current dressing causes regression in wound condition, may D/C ordered dressing product/s and apply Normal Saline Moist Dressing daily until next [place] / Other MD appointment. [place] of regression in wound condition at [phone]. Please direct any NON-WOUND related issues/requests for orders to patient's Primary Care Physician AZURE, BARRALES ([id number]) 1. I would recommend currently that we go ahead and initiate treatment with a continuation of the  wound care measures as before for the time being. This includes utilization of collagen in the center of the wound moistened with saline followed by plain packing gauze wet-to-dry to fill the space and then subsequently an ABD pad and XtraSorb. 2. I am also can recommend the patient continue as much as possible with appropriate offloading has a cat at home  for the time being. 3. With regard to skilled nursing facility placement Dr. Ines Bloomer office apparently is working on that according to his note that I reviewed and what the patient was told as well. We will hold off that and see what they are able to figure out otherwise my opinion would be for [place] to get in touch with the patient's primary care provider and see if that is a potential way for him to be admitted into skilled nursing. We will see patient back for reevaluation in 2 weeks here in the clinic. If anything worsens or changes patient will contact our office for additional recommendations. Electronic Signature(s) Signed: 01/21/2020 2:53:38 PM By: Worthy Keeler PA-C Entered By: Worthy Keeler on 01/21/2020 14:53:38 Bradley [name] ([id number]) -------------------------------------------------------------------------------- SuperBill Details Patient Name: Bradley [name] Date of Service: 01/21/2020 Medical Record Number: [id number] Patient Account Number: [id number] Date of Birth/Sex: April 06, 1946 (74 y.o. M) Treating RN: Cornell Barman Primary Care Provider: [name] Other Clinician: Referring Provider: [name] Treating Provider/Extender: [name], Ada [place] Weeks in Treatment: 27 Diagnosis Coding ICD-10 Codes Code Description L89.154 Pressure ulcer of sacral region, stage 4 M86.68 Other chronic osteomyelitis, other site G82.20 Paraplegia, unspecified Facility Procedures CPT4 Code: 22025427 Description: 99213 - WOUND CARE VISIT-LEV 3 EST PT Modifier: Quantity: 1 Physician Procedures CPT4 Code: 0623762 Description: 83151 - WC PHYS LEVEL 3 - EST PT Modifier: Quantity: 1 CPT4 Code: Description: ICD-10 Diagnosis Description L89.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

## 2020-01-22 DIAGNOSIS — G8220 Paraplegia, unspecified: Secondary | ICD-10-CM | POA: Diagnosis not present

## 2020-01-22 DIAGNOSIS — S24104S Unspecified injury at T11-T12 level of thoracic spinal cord, sequela: Secondary | ICD-10-CM | POA: Diagnosis not present

## 2020-01-22 DIAGNOSIS — N319 Neuromuscular dysfunction of bladder, unspecified: Secondary | ICD-10-CM | POA: Diagnosis not present

## 2020-01-22 DIAGNOSIS — J309 Allergic rhinitis, unspecified: Secondary | ICD-10-CM | POA: Diagnosis not present

## 2020-01-22 DIAGNOSIS — Z79899 Other long term (current) drug therapy: Secondary | ICD-10-CM | POA: Diagnosis not present

## 2020-01-22 DIAGNOSIS — L89154 Pressure ulcer of sacral region, stage 4: Secondary | ICD-10-CM | POA: Diagnosis not present

## 2020-01-22 DIAGNOSIS — N400 Enlarged prostate without lower urinary tract symptoms: Secondary | ICD-10-CM | POA: Diagnosis not present

## 2020-01-22 DIAGNOSIS — I10 Essential (primary) hypertension: Secondary | ICD-10-CM | POA: Diagnosis not present

## 2020-01-22 DIAGNOSIS — D509 Iron deficiency anemia, unspecified: Secondary | ICD-10-CM | POA: Diagnosis not present

## 2020-01-24 DIAGNOSIS — G8220 Paraplegia, unspecified: Secondary | ICD-10-CM | POA: Diagnosis not present

## 2020-01-24 DIAGNOSIS — D509 Iron deficiency anemia, unspecified: Secondary | ICD-10-CM | POA: Diagnosis not present

## 2020-01-24 DIAGNOSIS — N319 Neuromuscular dysfunction of bladder, unspecified: Secondary | ICD-10-CM | POA: Diagnosis not present

## 2020-01-24 DIAGNOSIS — J309 Allergic rhinitis, unspecified: Secondary | ICD-10-CM | POA: Diagnosis not present

## 2020-01-24 DIAGNOSIS — I10 Essential (primary) hypertension: Secondary | ICD-10-CM | POA: Diagnosis not present

## 2020-01-24 DIAGNOSIS — L89154 Pressure ulcer of sacral region, stage 4: Secondary | ICD-10-CM | POA: Diagnosis not present

## 2020-01-24 DIAGNOSIS — Z79899 Other long term (current) drug therapy: Secondary | ICD-10-CM | POA: Diagnosis not present

## 2020-01-24 DIAGNOSIS — S24104S Unspecified injury at T11-T12 level of thoracic spinal cord, sequela: Secondary | ICD-10-CM | POA: Diagnosis not present

## 2020-01-24 DIAGNOSIS — N400 Enlarged prostate without lower urinary tract symptoms: Secondary | ICD-10-CM | POA: Diagnosis not present

## 2020-01-24 NOTE — Progress Notes (Addendum)
Bradley [name] ([id number]) Visit Report for 01/21/2020 Arrival Information Details Patient Name: Bradley [name], Bradley [name]. Date of Service: 01/21/2020 2:15 PM Medical Record Number: [id number] Patient Account Number: [id number] Date of Birth/Sex: 1945/06/14 (74 y.o. M) Treating [name]: Bradley [name] Primary Care Bradley [name]: Bradley [name] Other Clinician: Referring Bradley [name]: Bradley [name] Treating Bradley [name]/Extender: Bradley [name], Bradley [name]: 83 Visit Information History Since Last Visit All ordered tests and consults were completed: No Patient Arrived: Wheel Chair Added or deleted any medications: No Arrival Time: 13:50 Any new allergies or adverse reactions: No Accompanied By: self Had a fall or experienced change in No Transfer Assistance: Civil Service fast streamer activities of daily living that may affect Patient Identification Verified: Yes risk of falls: Secondary Verification Process Completed: Yes Signs or symptoms of abuse/neglect since last visito No Patient Requires Transmission-Based Precautions: No Hospitalized since last visit: No Patient Has Alerts: No Implantable device outside of the clinic excluding No cellular tissue based products placed in the center since last visit: Pain Present Now: No Electronic Signature(s) Signed: 01/23/2020 10:49:47 AM By: Bradley [name] Entered By: Bradley [name] on 01/21/2020 13:51:00 Bradley [name] ([id number]) -------------------------------------------------------------------------------- Clinic Level of Care Assessment Details Patient Name: Bradley Loa A. Date of Service: 01/21/2020 2:15 PM Medical Record Number: [id number] Patient Account Number: [id number] Date of Birth/Sex: 03/12/1946 (74 y.o. M) Treating [name]: Bradley [name] Primary Care Bradley [name]: Bradley [name] Other Clinician: Referring Bradley [name]: Bradley [name] Treating Bradley [name]/Extender: Bradley [name], Bradley [name]: 27 Clinic Level of Care Assessment Items TOOL 4  Quantity Score []  - Use when only an EandM is performed on FOLLOW-UP visit 0 ASSESSMENTS - Nursing Assessment / Reassessment X - Reassessment of Co-morbidities (includes updates in patient status) 1 10 X- 1 5 Reassessment of Adherence to [name] Plan ASSESSMENTS - Wound and Skin Assessment / Reassessment X - Simple Wound Assessment / Reassessment - one wound 1 5 []  - 0 Complex Wound Assessment / Reassessment - multiple wounds []  - 0 Dermatologic / Skin Assessment (not related to wound area) ASSESSMENTS - Focused Assessment []  - Circumferential Edema Measurements - multi extremities 0 []  - 0 Nutritional Assessment / Counseling / Intervention []  - 0 Lower Extremity Assessment (monofilament, tuning fork, pulses) []  - 0 Peripheral Arterial Disease Assessment (using hand held doppler) ASSESSMENTS - Ostomy and/or Continence Assessment and Care []  - Incontinence Assessment and Management 0 []  - 0 Ostomy Care Assessment and Management (repouching, etc.) PROCESS - Coordination of Care X - Simple Patient / Family Education for ongoing care 1 15 []  - 0 Complex (extensive) Patient / Family Education for ongoing care []  - 0 Staff obtains Programmer, systems, Records, Test Results / Process Orders []  - 0 Staff telephones HHA, Nursing Homes / Clarify orders / etc []  - 0 Routine Transfer to another Facility (non-emergent condition) []  - 0 Routine Hospital Admission (non-emergent condition) []  - 0 New Admissions / Biomedical engineer / Ordering NPWT, Apligraf, etc. []  - 0 Emergency Hospital Admission (emergent condition) X- 1 10 Simple Discharge Coordination []  - 0 Complex (extensive) Discharge Coordination PROCESS - Special Needs []  - Pediatric / Minor Patient Management 0 []  - 0 Isolation Patient Management []  - 0 Hearing / Language / Visual special needs []  - 0 Assessment of Community assistance (transportation, D/C planning, etc.) []  - 0 Additional assistance / Altered  mentation []  - 0 Support Surface(s) Assessment (bed, cushion, seat, etc.) INTERVENTIONS - Wound Cleansing / Measurement Hourihan, Welborn A. ([id number]) X- 1 5 Simple Wound Cleansing - one wound []  - 0  Complex Wound Cleansing - multiple wounds X- 1 5 Wound Imaging (photographs - any number of wounds) []  - 0 Wound Tracing (instead of photographs) X- 1 5 Simple Wound Measurement - one wound []  - 0 Complex Wound Measurement - multiple wounds INTERVENTIONS - Wound Dressings []  - Small Wound Dressing one or multiple wounds 0 []  - 0 Medium Wound Dressing one or multiple wounds X- 1 20 Large Wound Dressing one or multiple wounds []  - 0 Application of Medications - topical []  - 0 Application of Medications - injection INTERVENTIONS - Miscellaneous []  - External ear exam 0 []  - 0 Specimen Collection (cultures, biopsies, blood, [name] fluids, etc.) []  - 0 Specimen(s) / Culture(s) sent or taken to Lab for analysis X- 1 10 Patient Transfer (multiple staff / Civil Service fast streamer / Similar devices) []  - 0 Simple Staple / Suture removal (25 or less) []  - 0 Complex Staple / Suture removal (26 or more) []  - 0 Hypo / Hyperglycemic Management (close monitor of Blood Glucose) []  - 0 Ankle / Brachial Index (ABI) - do not check if billed separately X- 1 5 Vital Signs Has the patient been seen at the hospital within the last three years: Yes Total Score: 95 Level Of Care: New/Established - Level 3 Electronic Signature(s) Signed: 01/23/2020 6:39:29 PM By: Bradley [name], BSN, [name], CWS, Bradley [name], BSN Entered By: Bradley [name], BSN, [name], CWS, Bradley on 01/21/2020 14:24:18 Bradley [name] ([id number]) -------------------------------------------------------------------------------- Complex / Palliative Patient Assessment Details Patient Name: [name], [name] Date of Service: 01/21/2020 2:15 PM Medical Record Number: [id number] Patient Account Number: [id number] Date of Birth/Sex: May 22, 1946 (74 y.o. M) Treating [name]: Bradley [name] Primary Care Omolara Carol: Bradley [name] Other Clinician: Referring Antoine Fiallos: Bradley [name] Treating Logan Baltimore/Extender: Bradley [name], Bradley [name]: 27 Palliative Management Criteria Complex Wound Management Criteria Patient has remarkable or complex co-morbidities requiring medications or treatments that extend wound healing times. Examples: o Diabetes mellitus with chronic renal failure or end stage renal disease requiring dialysis o Advanced or poorly controlled rheumatoid arthritis o Diabetes mellitus and end stage chronic obstructive pulmonary disease o Active cancer with current chemo- or radiation therapy Paraplegia The patient has limited personal or cognitive resources, or has no access to appropriate ongoing care providers, such that it is unreasonable to expect a level of compliance with prescribed advanced wound care treatments necessary to achieve desired healing outcomes. (Must be documented in physician progress notes.) Care Approach Wound Care Plan: Complex Wound Management Electronic Signature(s) Signed: 02/19/2020 9:08:05 AM By: Bradley [name], BSN, [name], CWS, Bradley [name], BSN Signed: 02/20/2020 5:16:00 PM By: Worthy Keeler PA-C Entered By: Bradley [name], BSN, [name], CWS, Bradley on 02/19/2020 09:08:05 Bradley [name] ([id number]) -------------------------------------------------------------------------------- Encounter Discharge Information Details Patient Name: Bradley [name], Bradley A. Date of Service: 01/21/2020 2:15 PM Medical Record Number: [id number] Patient Account Number: [id number] Date of Birth/Sex: 05/24/1946 (74 y.o. M) Treating [name]: Bradley [name] Primary Care Zahira Brummond: Bradley [name] Other Clinician: Referring Yesennia Hirota: Bradley [name] Treating Emmitt Matthews/Extender: Bradley [name], Bradley [name]: 13 Encounter Discharge Information Items Discharge Condition: Stable Ambulatory Status: Ambulatory Discharge Destination: Home Transportation: Private  Auto Accompanied By: self Schedule Follow-up Appointment: Yes Clinical Summary of Care: Electronic Signature(s) Signed: 01/23/2020 6:39:29 PM By: Bradley [name], BSN, [name], CWS, Bradley [name], BSN Entered By: Bradley [name], BSN, [name], CWS, Bradley on 01/21/2020 14:25:30 Bradley [name] ([id number]) -------------------------------------------------------------------------------- Lower Extremity Assessment Details Patient Name: Bradley [name], Bradley A. Date of Service: 01/21/2020 2:15 PM Medical Record Number: [id number] Patient Account Number: [id number] Date of Birth/Sex: 01/24/1946 (74 y.o.  M) Treating [name]: Bradley [name] Primary Care Treina Arscott: Bradley [name] Other Clinician: Referring Carlisha Wisler: Bradley [name] Treating Kyandre Okray/Extender: Bradley [name], Bradley [name]: 27 Electronic Signature(s) Signed: 01/23/2020 10:49:47 AM By: Bradley [name] Signed: 01/23/2020 6:39:29 PM By: Bradley [name], BSN, [name], CWS, Bradley [name], BSN Entered By: Bradley [name] on 01/21/2020 14:07:19 Bradley [name] ([id number]) -------------------------------------------------------------------------------- Multi Wound Chart Details Patient Name: Bradley [name], Bradley A. Date of Service: 01/21/2020 2:15 PM Medical Record Number: [id number] Patient Account Number: [id number] Date of Birth/Sex: 1945-09-13 (74 y.o. M) Treating [name]: Bradley [name] Primary Care Blayke Cordrey: Bradley [name] Other Clinician: Referring Kainat Pizana: Bradley [name] Treating Allysha Tryon/Extender: Bradley [name], Bradley [name]: 27 Vital Signs Height(in): Pulse(bpm): 54 Weight(lbs): Blood Pressure(mmHg): 142/56 [name] Mass Index(BMI): Temperature(F): 98.6 Respiratory Rate(breaths/min): 18 Photos: [N/A:N/A] Wound Location: Midline Sacrum N/A N/A Wounding Event: Pressure Injury N/A N/A Primary Etiology: Pressure Ulcer N/A N/A Comorbid History: Anemia, Hypertension, History of N/A N/A pressure wounds, Rheumatoid Arthritis, Paraplegia Date Acquired: 06/07/2015 N/A N/A Bradley of [name]:  27 N/A N/A Wound Status: Open N/A N/A Measurements L x W x D (cm) 12x8.5x6 N/A N/A Area (cm) : 80.111 N/A N/A Volume (cm) : 480.664 N/A N/A % Reduction in Area: -55.50% N/A N/A % Reduction in Volume: -55.50% N/A N/A Classification: Category/Stage IV N/A N/A Exudate Amount: Medium N/A N/A Exudate Type: Serosanguineous N/A N/A Exudate Color: red, brown N/A N/A Wound Margin: Epibole N/A N/A Granulation Amount: Large (67-100%) N/A N/A Granulation Quality: Pink N/A N/A Necrotic Amount: Small (1-33%) N/A N/A Exposed Structures: Fat Layer (Subcutaneous Tissue): N/A N/A Yes Muscle: Yes Fascia: No Tendon: No Joint: No Bone: No Epithelialization: None N/A N/A [name] Notes Electronic Signature(s) Signed: 01/23/2020 6:39:29 PM By: Bradley [name], BSN, [name], CWS, Bradley [name], BSN Entered By: Bradley [name], BSN, [name], CWS, Bradley on 01/21/2020 14:16:23 Bradley [name] ([id number]) -------------------------------------------------------------------------------- [place] Details Patient Name: Bradley [name], Bradley A. Date of Service: 01/21/2020 2:15 PM Medical Record Number: [id number] Patient Account Number: [id number] Date of Birth/Sex: [birth year] (74 y.o. M) Treating [name]: Bradley [name] Primary Care Sheika Coutts: Bradley [name] Other Clinician: Referring Charrisse Masley: Bradley [name] Treating Yesmin Mutch/Extender: Bradley [name], Bradley [name]: 79 Active Inactive Abuse / Safety / Falls / Self Care Management Nursing Diagnoses: Impaired physical mobility Goals: Patient will not develop complications from immobility Date Initiated: 07/16/2019 Target Resolution Date: 02/07/2020 Goal Status: Active Interventions: Assess Activities of Daily Living upon admission and as needed Notes: Pressure Nursing Diagnoses: Knowledge deficit related to causes and risk factors for pressure ulcer development Goals: Patient will remain free from development of additional pressure ulcers Date Initiated:  07/16/2019 Target Resolution Date: 02/07/2020 Goal Status: Active Interventions: Provide education on pressure ulcers Notes: Electronic Signature(s) Signed: 01/23/2020 6:39:29 PM By: Bradley [name], BSN, [name], CWS, Bradley [name], BSN Entered By: Bradley [name], BSN, [name], CWS, Bradley on 01/21/2020 14:15:46 Bradley [name] ([id number]) -------------------------------------------------------------------------------- Pain Assessment Details Patient Name: Bradley Loa A. Date of Service: 01/21/2020 2:15 PM Medical Record Number: [id number] Patient Account Number: [id number] Date of Birth/Sex: 12-20-1945 (74 y.o. M) Treating [name]: Bradley [name] Primary Care Jaima Janney: Bradley [name] Other Clinician: Referring Audrey Eller: Bradley [name] Treating Marisella Puccio/Extender: Bradley [name], Bradley [name]: 73 Active Problems Location of Pain Severity and Description of Pain Patient Has Paino No Site Locations With Dressing Change: No Pain Management and Medication Current Pain Management: Electronic Signature(s) Signed: 01/23/2020 10:49:47 AM By: Bradley [name] Signed: 01/23/2020 6:39:29 PM By: Bradley [name], BSN, [name], CWS, Bradley [name], BSN Entered By: Bradley [name] on 01/21/2020 14:04:51 Bradley [name] ([id number]) -------------------------------------------------------------------------------- Patient/Caregiver Education  Details Patient Name: Bradley [name], ROMER. Date of Service: 01/21/2020 2:15 PM Medical Record Number: [id number] Patient Account Number: [id number] Date of Birth/Gender: Feb 17, 1946 (74 y.o. M) Treating [name]: Bradley [name] Primary Care Physician: Bradley [name] Other Clinician: Referring Physician: Dion [name] Treating Physician/Extender: Sharalyn Ink in [name]: 39 Education Assessment Education Provided To: Patient Education Topics Provided Pressure: Handouts: Pressure Ulcers: Care and Offloading Methods: Demonstration, Explain/Verbal Responses: State content correctly Wound/Skin  Impairment: Handouts: Caring for Your Ulcer Methods: Demonstration, Explain/Verbal Responses: State content correctly Electronic Signature(s) Signed: 01/23/2020 6:39:29 PM By: Bradley [name], BSN, [name], CWS, Bradley [name], BSN Entered By: Bradley [name], BSN, [name], CWS, Bradley on 01/21/2020 14:24:47 Bradley [name] ([id number]) -------------------------------------------------------------------------------- Wound Assessment Details Patient Name: Bradley [name], Bradley A. Date of Service: 01/21/2020 2:15 PM Medical Record Number: [id number] Patient Account Number: [id number] Date of Birth/Sex: 1946/02/26 (74 y.o. M) Treating [name]: Bradley [name] Primary Care Marleni Gallardo: Bradley [name] Other Clinician: Referring Icyss Skog: Bradley [name] Treating Lexy Meininger/Extender: Bradley [name], Bradley [name]: 27 Wound Status Wound Number: 10 Primary Pressure Ulcer Etiology: Wound Location: Midline Sacrum Wound Open Wounding Event: Pressure Injury Status: Date Acquired: 06/07/2015 Comorbid Anemia, Hypertension, History of pressure wounds, Bradley Of [name]: 27 History: Rheumatoid Arthritis, Paraplegia Clustered Wound: No Photos Wound Measurements Length: (cm) 12 % Reduct Width: (cm) 8.5 % Reduct Depth: (cm) 6 Epitheli Area: (cm) 80.111 Volume: (cm) 480.664 ion in Area: -55.5% ion in Volume: -55.5% alization: None Wound Description Classification: Category/Stage IV Foul Odo Wound Margin: Epibole Slough/F Exudate Amount: Medium Exudate Type: Serosanguineous Exudate Color: red, brown r After Cleansing: No ibrino Yes Wound Bed Granulation Amount: Large (67-100%) Exposed Structure Granulation Quality: Pink Fascia Exposed: No Necrotic Amount: Small (1-33%) Fat Layer (Subcutaneous Tissue) Exposed: Yes Necrotic Quality: Adherent Slough Tendon Exposed: No Muscle Exposed: Yes Necrosis of Muscle: No Joint Exposed: No Bone Exposed: No [name] Notes Wound #10 (Midline Sacrum) Notes prisma, wet to dry, ABd,  xsorb Bradley [name], Bradley [name] ([id number]) Electronic Signature(s) Signed: 01/23/2020 10:49:47 AM By: Bradley [name] Signed: 01/23/2020 6:39:29 PM By: Bradley [name], BSN, [name], CWS, Bradley [name], BSN Entered By: Bradley [name] on 01/21/2020 14:07:03 Bradley [name] ([id number]) -------------------------------------------------------------------------------- Vitals Details Patient Name: Bradley Loa A. Date of Service: 01/21/2020 2:15 PM Medical Record Number: [id number] Patient Account Number: [id number] Date of Birth/Sex: [birth year] (74 y.o. M) Treating [name]: Bradley [name] Primary Care Jaselle Pryer: Bradley [name] Other Clinician: Referring Jenalyn Girdner: Bradley [name] Treating Latosha Gaylord/Extender: Bradley [name], Bradley [name]: 27 Vital Signs Time Taken: 01:50 Temperature (F): 98.6 Pulse (bpm): 81 Respiratory Rate (breaths/min): 18 Blood Pressure (mmHg): 142/56 Reference Range: 80 - 120 mg / dl Electronic Signature(s) Signed: 01/23/2020 10:49:47 AM By: Bradley [name] Entered By: Bradley [name] on 01/21/2020 14:04:38

## 2020-01-27 DIAGNOSIS — N319 Neuromuscular dysfunction of bladder, unspecified: Secondary | ICD-10-CM | POA: Diagnosis not present

## 2020-01-27 DIAGNOSIS — Z79899 Other long term (current) drug therapy: Secondary | ICD-10-CM | POA: Diagnosis not present

## 2020-01-27 DIAGNOSIS — G8220 Paraplegia, unspecified: Secondary | ICD-10-CM | POA: Diagnosis not present

## 2020-01-27 DIAGNOSIS — J309 Allergic rhinitis, unspecified: Secondary | ICD-10-CM | POA: Diagnosis not present

## 2020-01-27 DIAGNOSIS — I10 Essential (primary) hypertension: Secondary | ICD-10-CM | POA: Diagnosis not present

## 2020-01-27 DIAGNOSIS — N400 Enlarged prostate without lower urinary tract symptoms: Secondary | ICD-10-CM | POA: Diagnosis not present

## 2020-01-27 DIAGNOSIS — D509 Iron deficiency anemia, unspecified: Secondary | ICD-10-CM | POA: Diagnosis not present

## 2020-01-27 DIAGNOSIS — L89154 Pressure ulcer of sacral region, stage 4: Secondary | ICD-10-CM | POA: Diagnosis not present

## 2020-01-27 DIAGNOSIS — S24104S Unspecified injury at T11-T12 level of thoracic spinal cord, sequela: Secondary | ICD-10-CM | POA: Diagnosis not present

## 2020-01-29 DIAGNOSIS — D509 Iron deficiency anemia, unspecified: Secondary | ICD-10-CM | POA: Diagnosis not present

## 2020-01-29 DIAGNOSIS — I10 Essential (primary) hypertension: Secondary | ICD-10-CM | POA: Diagnosis not present

## 2020-01-29 DIAGNOSIS — L89154 Pressure ulcer of sacral region, stage 4: Secondary | ICD-10-CM | POA: Diagnosis not present

## 2020-01-29 DIAGNOSIS — S24104S Unspecified injury at T11-T12 level of thoracic spinal cord, sequela: Secondary | ICD-10-CM | POA: Diagnosis not present

## 2020-01-29 DIAGNOSIS — G8220 Paraplegia, unspecified: Secondary | ICD-10-CM | POA: Diagnosis not present

## 2020-01-29 DIAGNOSIS — Z79899 Other long term (current) drug therapy: Secondary | ICD-10-CM | POA: Diagnosis not present

## 2020-01-29 DIAGNOSIS — N319 Neuromuscular dysfunction of bladder, unspecified: Secondary | ICD-10-CM | POA: Diagnosis not present

## 2020-01-29 DIAGNOSIS — N400 Enlarged prostate without lower urinary tract symptoms: Secondary | ICD-10-CM | POA: Diagnosis not present

## 2020-01-29 DIAGNOSIS — J309 Allergic rhinitis, unspecified: Secondary | ICD-10-CM | POA: Diagnosis not present

## 2020-01-30 DIAGNOSIS — L89154 Pressure ulcer of sacral region, stage 4: Secondary | ICD-10-CM | POA: Diagnosis not present

## 2020-01-30 DIAGNOSIS — Z79899 Other long term (current) drug therapy: Secondary | ICD-10-CM | POA: Diagnosis not present

## 2020-01-30 DIAGNOSIS — J309 Allergic rhinitis, unspecified: Secondary | ICD-10-CM | POA: Diagnosis not present

## 2020-01-30 DIAGNOSIS — D509 Iron deficiency anemia, unspecified: Secondary | ICD-10-CM | POA: Diagnosis not present

## 2020-01-30 DIAGNOSIS — G8220 Paraplegia, unspecified: Secondary | ICD-10-CM | POA: Diagnosis not present

## 2020-01-30 DIAGNOSIS — N400 Enlarged prostate without lower urinary tract symptoms: Secondary | ICD-10-CM | POA: Diagnosis not present

## 2020-01-30 DIAGNOSIS — S24104S Unspecified injury at T11-T12 level of thoracic spinal cord, sequela: Secondary | ICD-10-CM | POA: Diagnosis not present

## 2020-01-30 DIAGNOSIS — N319 Neuromuscular dysfunction of bladder, unspecified: Secondary | ICD-10-CM | POA: Diagnosis not present

## 2020-01-30 DIAGNOSIS — I10 Essential (primary) hypertension: Secondary | ICD-10-CM | POA: Diagnosis not present

## 2020-01-31 DIAGNOSIS — G8220 Paraplegia, unspecified: Secondary | ICD-10-CM | POA: Diagnosis not present

## 2020-01-31 DIAGNOSIS — S24104S Unspecified injury at T11-T12 level of thoracic spinal cord, sequela: Secondary | ICD-10-CM | POA: Diagnosis not present

## 2020-01-31 DIAGNOSIS — D509 Iron deficiency anemia, unspecified: Secondary | ICD-10-CM | POA: Diagnosis not present

## 2020-01-31 DIAGNOSIS — L89154 Pressure ulcer of sacral region, stage 4: Secondary | ICD-10-CM | POA: Diagnosis not present

## 2020-01-31 DIAGNOSIS — M869 Osteomyelitis, unspecified: Secondary | ICD-10-CM | POA: Diagnosis not present

## 2020-01-31 DIAGNOSIS — I10 Essential (primary) hypertension: Secondary | ICD-10-CM | POA: Diagnosis not present

## 2020-01-31 DIAGNOSIS — N400 Enlarged prostate without lower urinary tract symptoms: Secondary | ICD-10-CM | POA: Diagnosis not present

## 2020-01-31 DIAGNOSIS — N319 Neuromuscular dysfunction of bladder, unspecified: Secondary | ICD-10-CM | POA: Diagnosis not present

## 2020-02-03 DIAGNOSIS — S24104S Unspecified injury at T11-T12 level of thoracic spinal cord, sequela: Secondary | ICD-10-CM | POA: Diagnosis not present

## 2020-02-03 DIAGNOSIS — J309 Allergic rhinitis, unspecified: Secondary | ICD-10-CM | POA: Diagnosis not present

## 2020-02-03 DIAGNOSIS — Z79899 Other long term (current) drug therapy: Secondary | ICD-10-CM | POA: Diagnosis not present

## 2020-02-03 DIAGNOSIS — N400 Enlarged prostate without lower urinary tract symptoms: Secondary | ICD-10-CM | POA: Diagnosis not present

## 2020-02-03 DIAGNOSIS — N319 Neuromuscular dysfunction of bladder, unspecified: Secondary | ICD-10-CM | POA: Diagnosis not present

## 2020-02-03 DIAGNOSIS — G8220 Paraplegia, unspecified: Secondary | ICD-10-CM | POA: Diagnosis not present

## 2020-02-03 DIAGNOSIS — I10 Essential (primary) hypertension: Secondary | ICD-10-CM | POA: Diagnosis not present

## 2020-02-03 DIAGNOSIS — D509 Iron deficiency anemia, unspecified: Secondary | ICD-10-CM | POA: Diagnosis not present

## 2020-02-03 DIAGNOSIS — L89154 Pressure ulcer of sacral region, stage 4: Secondary | ICD-10-CM | POA: Diagnosis not present

## 2020-02-04 ENCOUNTER — Ambulatory Visit: Payer: Medicare HMO | Admitting: Physician Assistant

## 2020-02-04 ENCOUNTER — Other Ambulatory Visit: Payer: Self-pay

## 2020-02-04 ENCOUNTER — Ambulatory Visit: Payer: Medicare HMO | Attending: Family Medicine | Admitting: Physical Therapy

## 2020-02-04 DIAGNOSIS — M6281 Muscle weakness (generalized): Secondary | ICD-10-CM

## 2020-02-04 DIAGNOSIS — Z87828 Personal history of other (healed) physical injury and trauma: Secondary | ICD-10-CM | POA: Diagnosis not present

## 2020-02-04 NOTE — Therapy (Addendum)
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 caregiver assist '[]' allows "hooking" to enable increased ability to perform ADLs or maintain balance  One armed device   '[]' Lt   '[]' Rt '[]' enable propulsion of manual wheelchair with one arm   '[]'                                            Brake/wheel lock extension '[]'  Lt   '[]'  Rt '[]' increase indep in applying wheel locks   '[]' Side guards '[]' prevent clothing getting caught in wheel or becoming soiled '[]'  prevent skin tears/abrasions  Battery:                                            '[x]' to power wheelchair                                                         Other:                                                                                                                        The above equipment has a life- long use expectancy. Growth and changes in medical and/or functional conditions would be the exceptions. This is to certify that the therapist has no financial relationship with durable medical provider or manufacturer. The therapist will not receive remuneration of any kind for the equipment recommended in this evaluation.   Patient has mobility limitation that significantly impairs safe, timely participation in one or more mobility related ADL's. (bathing, toileting, feeding, dressing, grooming, moving from room to room)  '[x]'  Yes '[]'  No  Will mobility device sufficiently improve ability to participate and/or be aided in participation of MRADL's?      '[x]'  Yes '[]'  No  Can limitation be compensated for with use of a cane or walker?                                    '[]'   Yes '[x]'  No  Does patient or caregiver demonstrate ability/potential ability & willingness to safely use the mobility device?    '[x]'  Yes '[]'  No  Does patient's home environment support use of recommended mobility device?            '[x]'  Yes '[]'  No  Does patient have sufficient upper extremity function necessary to functionally propel a  manual wheelchair?     '[]'  Yes '[x]'  No  Does patient have sufficient strength and trunk stability to safely operate a POV (scooter)?                                  '[]'  Yes '[x]'  No  Does patient need additional features/benefits provided by a power wheelchair for MRADL's in the home?        '[x]'  Yes '[]'  No  Does the patient demonstrate the ability to safely use a power wheelchair?                   '[x]'  Yes '[]'  No     Physician's Name Printed:          Barbaraann Cao, MD                                              Physician's Signature:  Date:     This is to certify that I, the above signed therapist have the following affiliations: '[]'  This DME provider '[]'  Manufacturer of recommended equipment '[]'  Patient's long term care facility '[x]'  None of the above  Therapist Name/Signature:     Norwood Levo. Barnet Glasgow PT, DPT                                       Date:                  Objective measurements completed on examination: See above findings.                    PT Long Term Goals - 02/04/20 1208      PT LONG TERM GOAL #1   Title Patient will be independent in understanding proper wheelchair recommendations and seating system for safe ADL and mobility;    Time 1    Period Days    Status Achieved    Target Date 02/04/20                  Plan - 02/04/20 1204    Clinical Impression Statement 74 yo Male presents to therapy for wheelchair evaluation. He would benefit from new power chair for better positioning and safety; Patient does require a power chair for power tilt/recline as he has a stage 4 pressure ulcer and lacks ability to effectively pressure relief without tilt/recline due to UE weakness and poor trunk control;    Personal Factors and Comorbidities Comorbidity 3+;Time since onset of injury/illness/exacerbation;Fitness;Age    Comorbidities stage 4 pressure ulcer, T7 complete spinal cord injury, arthritis;    Examination-Activity Limitations  Bathing;Caring for Others;Carry;Lift;Locomotion Level;Sit;Squat;Stairs;Stand;Toileting;Transfers    Examination-Participation Forensic scientist;Yard Work;Volunteer;Shop;School    Stability/Clinical Decision Making Stable/Uncomplicated    Clinical Decision Making Low    Rehab Potential Good    PT  Frequency One time visit    PT Treatment/Interventions Wheelchair mobility training;Patient/family education    Consulted and Agree with Plan of Care Patient           Patient will benefit from skilled therapeutic intervention in order to improve the following deficits and impairments:  Decreased balance, Decreased endurance, Decreased mobility, Improper body mechanics, Increased edema, Decreased range of motion, Decreased activity tolerance, Decreased safety awareness, Decreased strength, Postural dysfunction  Visit Diagnosis: Muscle weakness (generalized)  Hx of spinal cord injury     Problem List Patient Active Problem List   Diagnosis Date Noted  . SIRS (systemic inflammatory response syndrome) (HCC)   . Sepsis ([place]) 06/02/2019  . Neurogenic bladder 08/21/2018  . Recurrent UTI 08/21/2018  . Hx of spinal cord injury 08/21/2018  . Sacral decubitus ulcer 06/01/2018  . Osteomyelitis ([place]) 06/01/2018  . Paraplegia ([place]) 06/01/2018  . Iron deficiency anemia 04/19/2017    Daijah Scrivens PT, DPT 02/04/2020, 12:09 PM  [place] MAIN [facility] [address] [place], [place], [address] Phone: [phone]   Fax:  [phone]  Name: URBAN NAVAL MRN: [id number] Date of Birth: 04/14/1946

## 2020-02-05 DIAGNOSIS — G8220 Paraplegia, unspecified: Secondary | ICD-10-CM | POA: Diagnosis not present

## 2020-02-05 DIAGNOSIS — J309 Allergic rhinitis, unspecified: Secondary | ICD-10-CM | POA: Diagnosis not present

## 2020-02-05 DIAGNOSIS — D509 Iron deficiency anemia, unspecified: Secondary | ICD-10-CM | POA: Diagnosis not present

## 2020-02-05 DIAGNOSIS — N400 Enlarged prostate without lower urinary tract symptoms: Secondary | ICD-10-CM | POA: Diagnosis not present

## 2020-02-05 DIAGNOSIS — I10 Essential (primary) hypertension: Secondary | ICD-10-CM | POA: Diagnosis not present

## 2020-02-05 DIAGNOSIS — L89154 Pressure ulcer of sacral region, stage 4: Secondary | ICD-10-CM | POA: Diagnosis not present

## 2020-02-05 DIAGNOSIS — N319 Neuromuscular dysfunction of bladder, unspecified: Secondary | ICD-10-CM | POA: Diagnosis not present

## 2020-02-05 DIAGNOSIS — Z79899 Other long term (current) drug therapy: Secondary | ICD-10-CM | POA: Diagnosis not present

## 2020-02-05 DIAGNOSIS — S24104S Unspecified injury at T11-T12 level of thoracic spinal cord, sequela: Secondary | ICD-10-CM | POA: Diagnosis not present

## 2020-02-07 DIAGNOSIS — D509 Iron deficiency anemia, unspecified: Secondary | ICD-10-CM | POA: Diagnosis not present

## 2020-02-07 DIAGNOSIS — I10 Essential (primary) hypertension: Secondary | ICD-10-CM | POA: Diagnosis not present

## 2020-02-07 DIAGNOSIS — J309 Allergic rhinitis, unspecified: Secondary | ICD-10-CM | POA: Diagnosis not present

## 2020-02-07 DIAGNOSIS — S24104S Unspecified injury at T11-T12 level of thoracic spinal cord, sequela: Secondary | ICD-10-CM | POA: Diagnosis not present

## 2020-02-07 DIAGNOSIS — G8220 Paraplegia, unspecified: Secondary | ICD-10-CM | POA: Diagnosis not present

## 2020-02-07 DIAGNOSIS — Z79899 Other long term (current) drug therapy: Secondary | ICD-10-CM | POA: Diagnosis not present

## 2020-02-07 DIAGNOSIS — L89154 Pressure ulcer of sacral region, stage 4: Secondary | ICD-10-CM | POA: Diagnosis not present

## 2020-02-07 DIAGNOSIS — N400 Enlarged prostate without lower urinary tract symptoms: Secondary | ICD-10-CM | POA: Diagnosis not present

## 2020-02-07 DIAGNOSIS — N319 Neuromuscular dysfunction of bladder, unspecified: Secondary | ICD-10-CM | POA: Diagnosis not present

## 2020-02-10 DIAGNOSIS — G8220 Paraplegia, unspecified: Secondary | ICD-10-CM | POA: Diagnosis not present

## 2020-02-10 DIAGNOSIS — L89154 Pressure ulcer of sacral region, stage 4: Secondary | ICD-10-CM | POA: Diagnosis not present

## 2020-02-10 DIAGNOSIS — J309 Allergic rhinitis, unspecified: Secondary | ICD-10-CM | POA: Diagnosis not present

## 2020-02-10 DIAGNOSIS — N319 Neuromuscular dysfunction of bladder, unspecified: Secondary | ICD-10-CM | POA: Diagnosis not present

## 2020-02-10 DIAGNOSIS — Z79899 Other long term (current) drug therapy: Secondary | ICD-10-CM | POA: Diagnosis not present

## 2020-02-10 DIAGNOSIS — N400 Enlarged prostate without lower urinary tract symptoms: Secondary | ICD-10-CM | POA: Diagnosis not present

## 2020-02-10 DIAGNOSIS — I10 Essential (primary) hypertension: Secondary | ICD-10-CM | POA: Diagnosis not present

## 2020-02-10 DIAGNOSIS — D509 Iron deficiency anemia, unspecified: Secondary | ICD-10-CM | POA: Diagnosis not present

## 2020-02-10 DIAGNOSIS — S24104S Unspecified injury at T11-T12 level of thoracic spinal cord, sequela: Secondary | ICD-10-CM | POA: Diagnosis not present

## 2020-02-12 DIAGNOSIS — N319 Neuromuscular dysfunction of bladder, unspecified: Secondary | ICD-10-CM | POA: Diagnosis not present

## 2020-02-12 DIAGNOSIS — N400 Enlarged prostate without lower urinary tract symptoms: Secondary | ICD-10-CM | POA: Diagnosis not present

## 2020-02-12 DIAGNOSIS — J309 Allergic rhinitis, unspecified: Secondary | ICD-10-CM | POA: Diagnosis not present

## 2020-02-12 DIAGNOSIS — S24104S Unspecified injury at T11-T12 level of thoracic spinal cord, sequela: Secondary | ICD-10-CM | POA: Diagnosis not present

## 2020-02-12 DIAGNOSIS — I10 Essential (primary) hypertension: Secondary | ICD-10-CM | POA: Diagnosis not present

## 2020-02-12 DIAGNOSIS — Z79899 Other long term (current) drug therapy: Secondary | ICD-10-CM | POA: Diagnosis not present

## 2020-02-12 DIAGNOSIS — G8220 Paraplegia, unspecified: Secondary | ICD-10-CM | POA: Diagnosis not present

## 2020-02-12 DIAGNOSIS — D509 Iron deficiency anemia, unspecified: Secondary | ICD-10-CM | POA: Diagnosis not present

## 2020-02-12 DIAGNOSIS — L89154 Pressure ulcer of sacral region, stage 4: Secondary | ICD-10-CM | POA: Diagnosis not present

## 2020-02-13 DIAGNOSIS — D509 Iron deficiency anemia, unspecified: Secondary | ICD-10-CM | POA: Diagnosis not present

## 2020-02-13 DIAGNOSIS — I10 Essential (primary) hypertension: Secondary | ICD-10-CM | POA: Diagnosis not present

## 2020-02-13 DIAGNOSIS — N400 Enlarged prostate without lower urinary tract symptoms: Secondary | ICD-10-CM | POA: Diagnosis not present

## 2020-02-13 DIAGNOSIS — L89154 Pressure ulcer of sacral region, stage 4: Secondary | ICD-10-CM | POA: Diagnosis not present

## 2020-02-13 DIAGNOSIS — Z79899 Other long term (current) drug therapy: Secondary | ICD-10-CM | POA: Diagnosis not present

## 2020-02-13 DIAGNOSIS — G8220 Paraplegia, unspecified: Secondary | ICD-10-CM | POA: Diagnosis not present

## 2020-02-13 DIAGNOSIS — S24104S Unspecified injury at T11-T12 level of thoracic spinal cord, sequela: Secondary | ICD-10-CM | POA: Diagnosis not present

## 2020-02-13 DIAGNOSIS — J309 Allergic rhinitis, unspecified: Secondary | ICD-10-CM | POA: Diagnosis not present

## 2020-02-13 DIAGNOSIS — N319 Neuromuscular dysfunction of bladder, unspecified: Secondary | ICD-10-CM | POA: Diagnosis not present

## 2020-02-14 DIAGNOSIS — N319 Neuromuscular dysfunction of bladder, unspecified: Secondary | ICD-10-CM | POA: Diagnosis not present

## 2020-02-14 DIAGNOSIS — Z79899 Other long term (current) drug therapy: Secondary | ICD-10-CM | POA: Diagnosis not present

## 2020-02-14 DIAGNOSIS — I10 Essential (primary) hypertension: Secondary | ICD-10-CM | POA: Diagnosis not present

## 2020-02-14 DIAGNOSIS — S24104S Unspecified injury at T11-T12 level of thoracic spinal cord, sequela: Secondary | ICD-10-CM | POA: Diagnosis not present

## 2020-02-14 DIAGNOSIS — G8220 Paraplegia, unspecified: Secondary | ICD-10-CM | POA: Diagnosis not present

## 2020-02-14 DIAGNOSIS — J309 Allergic rhinitis, unspecified: Secondary | ICD-10-CM | POA: Diagnosis not present

## 2020-02-14 DIAGNOSIS — L89154 Pressure ulcer of sacral region, stage 4: Secondary | ICD-10-CM | POA: Diagnosis not present

## 2020-02-14 DIAGNOSIS — D509 Iron deficiency anemia, unspecified: Secondary | ICD-10-CM | POA: Diagnosis not present

## 2020-02-14 DIAGNOSIS — N400 Enlarged prostate without lower urinary tract symptoms: Secondary | ICD-10-CM | POA: Diagnosis not present

## 2020-02-17 DIAGNOSIS — N400 Enlarged prostate without lower urinary tract symptoms: Secondary | ICD-10-CM | POA: Diagnosis not present

## 2020-02-17 DIAGNOSIS — N319 Neuromuscular dysfunction of bladder, unspecified: Secondary | ICD-10-CM | POA: Diagnosis not present

## 2020-02-17 DIAGNOSIS — L89154 Pressure ulcer of sacral region, stage 4: Secondary | ICD-10-CM | POA: Diagnosis not present

## 2020-02-17 DIAGNOSIS — I10 Essential (primary) hypertension: Secondary | ICD-10-CM | POA: Diagnosis not present

## 2020-02-17 DIAGNOSIS — J309 Allergic rhinitis, unspecified: Secondary | ICD-10-CM | POA: Diagnosis not present

## 2020-02-17 DIAGNOSIS — Z79899 Other long term (current) drug therapy: Secondary | ICD-10-CM | POA: Diagnosis not present

## 2020-02-17 DIAGNOSIS — S24104S Unspecified injury at T11-T12 level of thoracic spinal cord, sequela: Secondary | ICD-10-CM | POA: Diagnosis not present

## 2020-02-17 DIAGNOSIS — D509 Iron deficiency anemia, unspecified: Secondary | ICD-10-CM | POA: Diagnosis not present

## 2020-02-17 DIAGNOSIS — G8220 Paraplegia, unspecified: Secondary | ICD-10-CM | POA: Diagnosis not present

## 2020-02-19 DIAGNOSIS — D509 Iron deficiency anemia, unspecified: Secondary | ICD-10-CM | POA: Diagnosis not present

## 2020-02-19 DIAGNOSIS — L89154 Pressure ulcer of sacral region, stage 4: Secondary | ICD-10-CM | POA: Diagnosis not present

## 2020-02-19 DIAGNOSIS — N400 Enlarged prostate without lower urinary tract symptoms: Secondary | ICD-10-CM | POA: Diagnosis not present

## 2020-02-19 DIAGNOSIS — I10 Essential (primary) hypertension: Secondary | ICD-10-CM | POA: Diagnosis not present

## 2020-02-19 DIAGNOSIS — S24104S Unspecified injury at T11-T12 level of thoracic spinal cord, sequela: Secondary | ICD-10-CM | POA: Diagnosis not present

## 2020-02-19 DIAGNOSIS — J309 Allergic rhinitis, unspecified: Secondary | ICD-10-CM | POA: Diagnosis not present

## 2020-02-19 DIAGNOSIS — Z79899 Other long term (current) drug therapy: Secondary | ICD-10-CM | POA: Diagnosis not present

## 2020-02-19 DIAGNOSIS — G8220 Paraplegia, unspecified: Secondary | ICD-10-CM | POA: Diagnosis not present

## 2020-02-19 DIAGNOSIS — N319 Neuromuscular dysfunction of bladder, unspecified: Secondary | ICD-10-CM | POA: Diagnosis not present

## 2020-02-21 DIAGNOSIS — G8220 Paraplegia, unspecified: Secondary | ICD-10-CM | POA: Diagnosis not present

## 2020-02-21 DIAGNOSIS — J309 Allergic rhinitis, unspecified: Secondary | ICD-10-CM | POA: Diagnosis not present

## 2020-02-21 DIAGNOSIS — I10 Essential (primary) hypertension: Secondary | ICD-10-CM | POA: Diagnosis not present

## 2020-02-21 DIAGNOSIS — S24104S Unspecified injury at T11-T12 level of thoracic spinal cord, sequela: Secondary | ICD-10-CM | POA: Diagnosis not present

## 2020-02-21 DIAGNOSIS — L89154 Pressure ulcer of sacral region, stage 4: Secondary | ICD-10-CM | POA: Diagnosis not present

## 2020-02-21 DIAGNOSIS — N319 Neuromuscular dysfunction of bladder, unspecified: Secondary | ICD-10-CM | POA: Diagnosis not present

## 2020-02-21 DIAGNOSIS — N400 Enlarged prostate without lower urinary tract symptoms: Secondary | ICD-10-CM | POA: Diagnosis not present

## 2020-02-21 DIAGNOSIS — Z79899 Other long term (current) drug therapy: Secondary | ICD-10-CM | POA: Diagnosis not present

## 2020-02-21 DIAGNOSIS — D509 Iron deficiency anemia, unspecified: Secondary | ICD-10-CM | POA: Diagnosis not present

## 2020-02-24 DIAGNOSIS — J309 Allergic rhinitis, unspecified: Secondary | ICD-10-CM | POA: Diagnosis not present

## 2020-02-24 DIAGNOSIS — N400 Enlarged prostate without lower urinary tract symptoms: Secondary | ICD-10-CM | POA: Diagnosis not present

## 2020-02-24 DIAGNOSIS — G8220 Paraplegia, unspecified: Secondary | ICD-10-CM | POA: Diagnosis not present

## 2020-02-24 DIAGNOSIS — Z79899 Other long term (current) drug therapy: Secondary | ICD-10-CM | POA: Diagnosis not present

## 2020-02-24 DIAGNOSIS — I10 Essential (primary) hypertension: Secondary | ICD-10-CM | POA: Diagnosis not present

## 2020-02-24 DIAGNOSIS — S24104S Unspecified injury at T11-T12 level of thoracic spinal cord, sequela: Secondary | ICD-10-CM | POA: Diagnosis not present

## 2020-02-24 DIAGNOSIS — L89154 Pressure ulcer of sacral region, stage 4: Secondary | ICD-10-CM | POA: Diagnosis not present

## 2020-02-24 DIAGNOSIS — D509 Iron deficiency anemia, unspecified: Secondary | ICD-10-CM | POA: Diagnosis not present

## 2020-02-24 DIAGNOSIS — N319 Neuromuscular dysfunction of bladder, unspecified: Secondary | ICD-10-CM | POA: Diagnosis not present

## 2020-02-26 DIAGNOSIS — N400 Enlarged prostate without lower urinary tract symptoms: Secondary | ICD-10-CM | POA: Diagnosis not present

## 2020-02-26 DIAGNOSIS — N319 Neuromuscular dysfunction of bladder, unspecified: Secondary | ICD-10-CM | POA: Diagnosis not present

## 2020-02-26 DIAGNOSIS — J309 Allergic rhinitis, unspecified: Secondary | ICD-10-CM | POA: Diagnosis not present

## 2020-02-26 DIAGNOSIS — Z79899 Other long term (current) drug therapy: Secondary | ICD-10-CM | POA: Diagnosis not present

## 2020-02-26 DIAGNOSIS — L89154 Pressure ulcer of sacral region, stage 4: Secondary | ICD-10-CM | POA: Diagnosis not present

## 2020-02-26 DIAGNOSIS — I10 Essential (primary) hypertension: Secondary | ICD-10-CM | POA: Diagnosis not present

## 2020-02-26 DIAGNOSIS — D509 Iron deficiency anemia, unspecified: Secondary | ICD-10-CM | POA: Diagnosis not present

## 2020-02-26 DIAGNOSIS — G8220 Paraplegia, unspecified: Secondary | ICD-10-CM | POA: Diagnosis not present

## 2020-02-26 DIAGNOSIS — S24104S Unspecified injury at T11-T12 level of thoracic spinal cord, sequela: Secondary | ICD-10-CM | POA: Diagnosis not present

## 2020-02-28 DIAGNOSIS — J309 Allergic rhinitis, unspecified: Secondary | ICD-10-CM | POA: Diagnosis not present

## 2020-02-28 DIAGNOSIS — Z79899 Other long term (current) drug therapy: Secondary | ICD-10-CM | POA: Diagnosis not present

## 2020-02-28 DIAGNOSIS — I10 Essential (primary) hypertension: Secondary | ICD-10-CM | POA: Diagnosis not present

## 2020-02-28 DIAGNOSIS — L89154 Pressure ulcer of sacral region, stage 4: Secondary | ICD-10-CM | POA: Diagnosis not present

## 2020-02-28 DIAGNOSIS — N400 Enlarged prostate without lower urinary tract symptoms: Secondary | ICD-10-CM | POA: Diagnosis not present

## 2020-02-28 DIAGNOSIS — D509 Iron deficiency anemia, unspecified: Secondary | ICD-10-CM | POA: Diagnosis not present

## 2020-02-28 DIAGNOSIS — G8220 Paraplegia, unspecified: Secondary | ICD-10-CM | POA: Diagnosis not present

## 2020-02-28 DIAGNOSIS — S24104S Unspecified injury at T11-T12 level of thoracic spinal cord, sequela: Secondary | ICD-10-CM | POA: Diagnosis not present

## 2020-02-28 DIAGNOSIS — N319 Neuromuscular dysfunction of bladder, unspecified: Secondary | ICD-10-CM | POA: Diagnosis not present

## 2020-03-02 DIAGNOSIS — D509 Iron deficiency anemia, unspecified: Secondary | ICD-10-CM | POA: Diagnosis not present

## 2020-03-02 DIAGNOSIS — Z79899 Other long term (current) drug therapy: Secondary | ICD-10-CM | POA: Diagnosis not present

## 2020-03-02 DIAGNOSIS — J309 Allergic rhinitis, unspecified: Secondary | ICD-10-CM | POA: Diagnosis not present

## 2020-03-02 DIAGNOSIS — N400 Enlarged prostate without lower urinary tract symptoms: Secondary | ICD-10-CM | POA: Diagnosis not present

## 2020-03-02 DIAGNOSIS — I10 Essential (primary) hypertension: Secondary | ICD-10-CM | POA: Diagnosis not present

## 2020-03-02 DIAGNOSIS — N319 Neuromuscular dysfunction of bladder, unspecified: Secondary | ICD-10-CM | POA: Diagnosis not present

## 2020-03-02 DIAGNOSIS — M869 Osteomyelitis, unspecified: Secondary | ICD-10-CM | POA: Diagnosis not present

## 2020-03-02 DIAGNOSIS — S24104S Unspecified injury at T11-T12 level of thoracic spinal cord, sequela: Secondary | ICD-10-CM | POA: Diagnosis not present

## 2020-03-02 DIAGNOSIS — L89154 Pressure ulcer of sacral region, stage 4: Secondary | ICD-10-CM | POA: Diagnosis not present

## 2020-03-02 DIAGNOSIS — G8220 Paraplegia, unspecified: Secondary | ICD-10-CM | POA: Diagnosis not present

## 2020-03-03 ENCOUNTER — Other Ambulatory Visit: Payer: Self-pay

## 2020-03-03 ENCOUNTER — Telehealth: Payer: Self-pay | Admitting: Urology

## 2020-03-03 NOTE — Telephone Encounter (Signed)
Pt has appt scheduled for tomorrow and is unable to come due to transportation [employer]( that is booked for the rest of the week.  Pt is a paraplegic and wants to know if his son can bring in a urine sample.  Pt says he feels like he has a UTI.  He is not having symptoms besides chills in the afternoon.

## 2020-03-03 NOTE — Telephone Encounter (Signed)
Patient notified and his son will bring a urine in tomorrow morning. An appointment has been scheduled for Tuesday.

## 2020-03-03 NOTE — Telephone Encounter (Signed)
This is not ideal as the visit complaint is blood in the urine.  He really needs to be evaluated especially if he is having chills, but the son can bring an urine in and he needs to get his father in ASAP
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
## 2020-03-04 ENCOUNTER — Other Ambulatory Visit: Payer: Self-pay

## 2020-03-04 ENCOUNTER — Other Ambulatory Visit: Payer: Medicare HMO

## 2020-03-04 ENCOUNTER — Ambulatory Visit: Payer: Medicare HMO | Admitting: Urology

## 2020-03-04 DIAGNOSIS — G8220 Paraplegia, unspecified: Secondary | ICD-10-CM | POA: Diagnosis not present

## 2020-03-04 DIAGNOSIS — Z79899 Other long term (current) drug therapy: Secondary | ICD-10-CM | POA: Diagnosis not present

## 2020-03-04 DIAGNOSIS — N319 Neuromuscular dysfunction of bladder, unspecified: Secondary | ICD-10-CM | POA: Diagnosis not present

## 2020-03-04 DIAGNOSIS — R310 Gross hematuria: Secondary | ICD-10-CM

## 2020-03-04 DIAGNOSIS — I10 Essential (primary) hypertension: Secondary | ICD-10-CM | POA: Diagnosis not present

## 2020-03-04 DIAGNOSIS — L89154 Pressure ulcer of sacral region, stage 4: Secondary | ICD-10-CM | POA: Diagnosis not present

## 2020-03-04 DIAGNOSIS — S24104S Unspecified injury at T11-T12 level of thoracic spinal cord, sequela: Secondary | ICD-10-CM | POA: Diagnosis not present

## 2020-03-04 DIAGNOSIS — J309 Allergic rhinitis, unspecified: Secondary | ICD-10-CM | POA: Diagnosis not present

## 2020-03-04 DIAGNOSIS — D509 Iron deficiency anemia, unspecified: Secondary | ICD-10-CM | POA: Diagnosis not present

## 2020-03-04 DIAGNOSIS — N400 Enlarged prostate without lower urinary tract symptoms: Secondary | ICD-10-CM | POA: Diagnosis not present

## 2020-03-05 LAB — MICROSCOPIC EXAMINATION

## 2020-03-05 LAB — URINALYSIS, COMPLETE
Bilirubin, UA: NEGATIVE
Glucose, UA: NEGATIVE
Ketones, UA: NEGATIVE
Nitrite, UA: POSITIVE — AB
Protein,UA: NEGATIVE
RBC, UA: NEGATIVE
Specific Gravity, UA: 1.01 (ref 1.005–1.030)
Urobilinogen, Ur: 0.2 mg/dL (ref 0.2–1.0)
pH, UA: 6.5 (ref 5.0–7.5)

## 2020-03-07 LAB — CULTURE, URINE COMPREHENSIVE
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
## 2020-03-10 ENCOUNTER — Encounter: Payer: Self-pay | Admitting: Urology

## 2020-03-10 ENCOUNTER — Ambulatory Visit (INDEPENDENT_AMBULATORY_CARE_PROVIDER_SITE_OTHER): Payer: Medicare HMO | Admitting: Urology

## 2020-03-10 ENCOUNTER — Other Ambulatory Visit: Payer: Self-pay

## 2020-03-10 VITALS — BP 126/74 | HR 89 | Wt 213.0 lb

## 2020-03-10 DIAGNOSIS — Z1612 Extended spectrum beta lactamase (ESBL) resistance: Secondary | ICD-10-CM | POA: Diagnosis not present

## 2020-03-10 DIAGNOSIS — N390 Urinary tract infection, site not specified: Secondary | ICD-10-CM | POA: Diagnosis not present

## 2020-03-10 DIAGNOSIS — B9629 Other Escherichia coli [E. coli] as the cause of diseases classified elsewhere: Secondary | ICD-10-CM

## 2020-03-10 DIAGNOSIS — R310 Gross hematuria: Secondary | ICD-10-CM | POA: Diagnosis not present

## 2020-03-10 DIAGNOSIS — R232 Flushing: Secondary | ICD-10-CM | POA: Diagnosis not present

## 2020-03-10 MED ORDER — NITROFURANTOIN MONOHYD MACRO 100 MG PO CAPS: 100.0000 mg | ORAL_CAPSULE | Freq: Two times a day (BID) | ORAL | 0 refills | Status: DC

## 2020-03-11 ENCOUNTER — Telehealth: Payer: Self-pay | Admitting: Family Medicine

## 2020-03-11 DIAGNOSIS — G8220 Paraplegia, unspecified: Secondary | ICD-10-CM | POA: Diagnosis not present

## 2020-03-11 DIAGNOSIS — D509 Iron deficiency anemia, unspecified: Secondary | ICD-10-CM | POA: Diagnosis not present

## 2020-03-11 DIAGNOSIS — S24104S Unspecified injury at T11-T12 level of thoracic spinal cord, sequela: Secondary | ICD-10-CM | POA: Diagnosis not present

## 2020-03-11 DIAGNOSIS — I10 Essential (primary) hypertension: Secondary | ICD-10-CM | POA: Diagnosis not present

## 2020-03-11 DIAGNOSIS — L89154 Pressure ulcer of sacral region, stage 4: Secondary | ICD-10-CM | POA: Diagnosis not present

## 2020-03-11 DIAGNOSIS — J309 Allergic rhinitis, unspecified: Secondary | ICD-10-CM | POA: Diagnosis not present

## 2020-03-11 DIAGNOSIS — N319 Neuromuscular dysfunction of bladder, unspecified: Secondary | ICD-10-CM | POA: Diagnosis not present

## 2020-03-11 DIAGNOSIS — N400 Enlarged prostate without lower urinary tract symptoms: Secondary | ICD-10-CM | POA: Diagnosis not present

## 2020-03-11 DIAGNOSIS — Z79899 Other long term (current) drug therapy: Secondary | ICD-10-CM | POA: Diagnosis not present

## 2020-03-11 LAB — TSH: TSH: 2.46 u[IU]/mL (ref 0.450–4.500)

## 2020-03-11 LAB — TESTOSTERONE: Testosterone: 77 ng/dL — ABNORMAL LOW (ref 264–916)

## 2020-03-11 NOTE — Telephone Encounter (Signed)
LMOM for patient to return call.

## 2020-03-11 NOTE — Telephone Encounter (Signed)
-----   Message from Nori Riis, PA-C sent at 03/11/2020  9:33 AM EDT ----- Please let Mr. Mckay know that his testosterone level returned below normal, but we will need another morning testosterone to confirm.

## 2020-03-12 NOTE — Telephone Encounter (Signed)
Patient notified and appointment made

## 2020-03-13 ENCOUNTER — Other Ambulatory Visit: Payer: Self-pay | Admitting: Family Medicine

## 2020-03-13 DIAGNOSIS — I10 Essential (primary) hypertension: Secondary | ICD-10-CM | POA: Diagnosis not present

## 2020-03-13 DIAGNOSIS — E291 Testicular hypofunction: Secondary | ICD-10-CM

## 2020-03-13 DIAGNOSIS — N400 Enlarged prostate without lower urinary tract symptoms: Secondary | ICD-10-CM | POA: Diagnosis not present

## 2020-03-13 DIAGNOSIS — N319 Neuromuscular dysfunction of bladder, unspecified: Secondary | ICD-10-CM | POA: Diagnosis not present

## 2020-03-13 DIAGNOSIS — Z79899 Other long term (current) drug therapy: Secondary | ICD-10-CM | POA: Diagnosis not present

## 2020-03-13 DIAGNOSIS — J309 Allergic rhinitis, unspecified: Secondary | ICD-10-CM | POA: Diagnosis not present

## 2020-03-13 DIAGNOSIS — S24104S Unspecified injury at T11-T12 level of thoracic spinal cord, sequela: Secondary | ICD-10-CM | POA: Diagnosis not present

## 2020-03-13 DIAGNOSIS — L89154 Pressure ulcer of sacral region, stage 4: Secondary | ICD-10-CM | POA: Diagnosis not present

## 2020-03-13 DIAGNOSIS — G8220 Paraplegia, unspecified: Secondary | ICD-10-CM | POA: Diagnosis not present

## 2020-03-13 DIAGNOSIS — D509 Iron deficiency anemia, unspecified: Secondary | ICD-10-CM | POA: Diagnosis not present

## 2020-03-16 DIAGNOSIS — L89154 Pressure ulcer of sacral region, stage 4: Secondary | ICD-10-CM | POA: Diagnosis not present

## 2020-03-16 DIAGNOSIS — I10 Essential (primary) hypertension: Secondary | ICD-10-CM | POA: Diagnosis not present

## 2020-03-16 DIAGNOSIS — G8220 Paraplegia, unspecified: Secondary | ICD-10-CM | POA: Diagnosis not present

## 2020-03-16 DIAGNOSIS — S24104S Unspecified injury at T11-T12 level of thoracic spinal cord, sequela: Secondary | ICD-10-CM | POA: Diagnosis not present

## 2020-03-16 DIAGNOSIS — D509 Iron deficiency anemia, unspecified: Secondary | ICD-10-CM | POA: Diagnosis not present

## 2020-03-16 DIAGNOSIS — J309 Allergic rhinitis, unspecified: Secondary | ICD-10-CM | POA: Diagnosis not present

## 2020-03-16 DIAGNOSIS — Z79899 Other long term (current) drug therapy: Secondary | ICD-10-CM | POA: Diagnosis not present

## 2020-03-16 DIAGNOSIS — N400 Enlarged prostate without lower urinary tract symptoms: Secondary | ICD-10-CM | POA: Diagnosis not present

## 2020-03-16 DIAGNOSIS — N319 Neuromuscular dysfunction of bladder, unspecified: Secondary | ICD-10-CM | POA: Diagnosis not present

## 2020-03-17 ENCOUNTER — Other Ambulatory Visit: Payer: Medicare HMO

## 2020-03-17 ENCOUNTER — Other Ambulatory Visit: Payer: Self-pay

## 2020-03-17 DIAGNOSIS — E291 Testicular hypofunction: Secondary | ICD-10-CM

## 2020-03-18 ENCOUNTER — Telehealth: Payer: Self-pay | Admitting: Family Medicine

## 2020-03-18 DIAGNOSIS — N319 Neuromuscular dysfunction of bladder, unspecified: Secondary | ICD-10-CM | POA: Diagnosis not present

## 2020-03-18 DIAGNOSIS — J309 Allergic rhinitis, unspecified: Secondary | ICD-10-CM | POA: Diagnosis not present

## 2020-03-18 DIAGNOSIS — D509 Iron deficiency anemia, unspecified: Secondary | ICD-10-CM | POA: Diagnosis not present

## 2020-03-18 DIAGNOSIS — I10 Essential (primary) hypertension: Secondary | ICD-10-CM | POA: Diagnosis not present

## 2020-03-18 DIAGNOSIS — G8220 Paraplegia, unspecified: Secondary | ICD-10-CM | POA: Diagnosis not present

## 2020-03-18 DIAGNOSIS — L89154 Pressure ulcer of sacral region, stage 4: Secondary | ICD-10-CM | POA: Diagnosis not present

## 2020-03-18 DIAGNOSIS — S24104S Unspecified injury at T11-T12 level of thoracic spinal cord, sequela: Secondary | ICD-10-CM | POA: Diagnosis not present

## 2020-03-18 DIAGNOSIS — N400 Enlarged prostate without lower urinary tract symptoms: Secondary | ICD-10-CM | POA: Diagnosis not present

## 2020-03-18 DIAGNOSIS — Z79899 Other long term (current) drug therapy: Secondary | ICD-10-CM | POA: Diagnosis not present

## 2020-03-18 LAB — TESTOSTERONE: Testosterone: 51 ng/dL — ABNORMAL LOW (ref 264–916)

## 2020-03-18 NOTE — Telephone Encounter (Signed)
-----  Message from Nori Riis, PA-C sent at 03/18/2020 10:19 AM EDT ----- Please let Bradley [name] know that his second testosterone level has also returned below 300, so he has met criteria for testosterone deficiency.  If he would like, he can convert his lab appointment to an office visit so that we may discuss testosterone replacement therapy and also obtain further blood work to investigate further as to why his testosterone level is abnormal.

## 2020-03-18 NOTE — Telephone Encounter (Signed)
Patient notified and appointment has been changed.

## 2020-03-20 DIAGNOSIS — N400 Enlarged prostate without lower urinary tract symptoms: Secondary | ICD-10-CM | POA: Diagnosis not present

## 2020-03-20 DIAGNOSIS — J309 Allergic rhinitis, unspecified: Secondary | ICD-10-CM | POA: Diagnosis not present

## 2020-03-20 DIAGNOSIS — I10 Essential (primary) hypertension: Secondary | ICD-10-CM | POA: Diagnosis not present

## 2020-03-20 DIAGNOSIS — Z79899 Other long term (current) drug therapy: Secondary | ICD-10-CM | POA: Diagnosis not present

## 2020-03-20 DIAGNOSIS — S24104S Unspecified injury at T11-T12 level of thoracic spinal cord, sequela: Secondary | ICD-10-CM | POA: Diagnosis not present

## 2020-03-20 DIAGNOSIS — G8220 Paraplegia, unspecified: Secondary | ICD-10-CM | POA: Diagnosis not present

## 2020-03-20 DIAGNOSIS — N319 Neuromuscular dysfunction of bladder, unspecified: Secondary | ICD-10-CM | POA: Diagnosis not present

## 2020-03-20 DIAGNOSIS — D509 Iron deficiency anemia, unspecified: Secondary | ICD-10-CM | POA: Diagnosis not present

## 2020-03-20 DIAGNOSIS — L89154 Pressure ulcer of sacral region, stage 4: Secondary | ICD-10-CM | POA: Diagnosis not present

## 2020-03-23 DIAGNOSIS — G8220 Paraplegia, unspecified: Secondary | ICD-10-CM | POA: Diagnosis not present

## 2020-03-23 DIAGNOSIS — J309 Allergic rhinitis, unspecified: Secondary | ICD-10-CM | POA: Diagnosis not present

## 2020-03-23 DIAGNOSIS — Z79899 Other long term (current) drug therapy: Secondary | ICD-10-CM | POA: Diagnosis not present

## 2020-03-23 DIAGNOSIS — N319 Neuromuscular dysfunction of bladder, unspecified: Secondary | ICD-10-CM | POA: Diagnosis not present

## 2020-03-23 DIAGNOSIS — N400 Enlarged prostate without lower urinary tract symptoms: Secondary | ICD-10-CM | POA: Diagnosis not present

## 2020-03-23 DIAGNOSIS — S24104S Unspecified injury at T11-T12 level of thoracic spinal cord, sequela: Secondary | ICD-10-CM | POA: Diagnosis not present

## 2020-03-23 DIAGNOSIS — L89154 Pressure ulcer of sacral region, stage 4: Secondary | ICD-10-CM | POA: Diagnosis not present

## 2020-03-23 DIAGNOSIS — I10 Essential (primary) hypertension: Secondary | ICD-10-CM | POA: Diagnosis not present

## 2020-03-23 DIAGNOSIS — D509 Iron deficiency anemia, unspecified: Secondary | ICD-10-CM | POA: Diagnosis not present

## 2020-03-25 DIAGNOSIS — N319 Neuromuscular dysfunction of bladder, unspecified: Secondary | ICD-10-CM | POA: Diagnosis not present

## 2020-03-25 DIAGNOSIS — D509 Iron deficiency anemia, unspecified: Secondary | ICD-10-CM | POA: Diagnosis not present

## 2020-03-25 DIAGNOSIS — J309 Allergic rhinitis, unspecified: Secondary | ICD-10-CM | POA: Diagnosis not present

## 2020-03-25 DIAGNOSIS — G8220 Paraplegia, unspecified: Secondary | ICD-10-CM | POA: Diagnosis not present

## 2020-03-25 DIAGNOSIS — S24104S Unspecified injury at T11-T12 level of thoracic spinal cord, sequela: Secondary | ICD-10-CM | POA: Diagnosis not present

## 2020-03-25 DIAGNOSIS — Z79899 Other long term (current) drug therapy: Secondary | ICD-10-CM | POA: Diagnosis not present

## 2020-03-25 DIAGNOSIS — N400 Enlarged prostate without lower urinary tract symptoms: Secondary | ICD-10-CM | POA: Diagnosis not present

## 2020-03-25 DIAGNOSIS — I10 Essential (primary) hypertension: Secondary | ICD-10-CM | POA: Diagnosis not present

## 2020-03-25 DIAGNOSIS — L89154 Pressure ulcer of sacral region, stage 4: Secondary | ICD-10-CM | POA: Diagnosis not present

## 2020-03-26 DIAGNOSIS — D509 Iron deficiency anemia, unspecified: Secondary | ICD-10-CM | POA: Diagnosis not present

## 2020-03-26 DIAGNOSIS — I10 Essential (primary) hypertension: Secondary | ICD-10-CM | POA: Diagnosis not present

## 2020-03-26 DIAGNOSIS — Z20822 Contact with and (suspected) exposure to covid-19: Secondary | ICD-10-CM | POA: Diagnosis not present

## 2020-03-27 DIAGNOSIS — D509 Iron deficiency anemia, unspecified: Secondary | ICD-10-CM | POA: Diagnosis not present

## 2020-03-27 DIAGNOSIS — I10 Essential (primary) hypertension: Secondary | ICD-10-CM | POA: Diagnosis not present

## 2020-03-27 DIAGNOSIS — L89154 Pressure ulcer of sacral region, stage 4: Secondary | ICD-10-CM | POA: Diagnosis not present

## 2020-03-27 DIAGNOSIS — J309 Allergic rhinitis, unspecified: Secondary | ICD-10-CM | POA: Diagnosis not present

## 2020-03-27 DIAGNOSIS — G8220 Paraplegia, unspecified: Secondary | ICD-10-CM | POA: Diagnosis not present

## 2020-03-27 DIAGNOSIS — Z79899 Other long term (current) drug therapy: Secondary | ICD-10-CM | POA: Diagnosis not present

## 2020-03-27 DIAGNOSIS — N319 Neuromuscular dysfunction of bladder, unspecified: Secondary | ICD-10-CM | POA: Diagnosis not present

## 2020-03-27 DIAGNOSIS — N400 Enlarged prostate without lower urinary tract symptoms: Secondary | ICD-10-CM | POA: Diagnosis not present

## 2020-03-27 DIAGNOSIS — S24104S Unspecified injury at T11-T12 level of thoracic spinal cord, sequela: Secondary | ICD-10-CM | POA: Diagnosis not present

## 2020-03-30 DIAGNOSIS — U071 COVID-19: Secondary | ICD-10-CM | POA: Diagnosis not present

## 2020-03-30 DIAGNOSIS — E876 Hypokalemia: Secondary | ICD-10-CM | POA: Diagnosis not present

## 2020-03-30 DIAGNOSIS — R1310 Dysphagia, unspecified: Secondary | ICD-10-CM | POA: Diagnosis not present

## 2020-03-30 DIAGNOSIS — D508 Other iron deficiency anemias: Secondary | ICD-10-CM | POA: Diagnosis not present

## 2020-03-31 ENCOUNTER — Other Ambulatory Visit: Payer: Self-pay | Admitting: Family Medicine

## 2020-03-31 ENCOUNTER — Telehealth: Payer: Self-pay

## 2020-03-31 DIAGNOSIS — L89154 Pressure ulcer of sacral region, stage 4: Secondary | ICD-10-CM | POA: Diagnosis not present

## 2020-03-31 DIAGNOSIS — J309 Allergic rhinitis, unspecified: Secondary | ICD-10-CM | POA: Diagnosis not present

## 2020-03-31 DIAGNOSIS — R1310 Dysphagia, unspecified: Secondary | ICD-10-CM

## 2020-03-31 DIAGNOSIS — S24104S Unspecified injury at T11-T12 level of thoracic spinal cord, sequela: Secondary | ICD-10-CM | POA: Diagnosis not present

## 2020-03-31 DIAGNOSIS — Z79899 Other long term (current) drug therapy: Secondary | ICD-10-CM | POA: Diagnosis not present

## 2020-03-31 DIAGNOSIS — D649 Anemia, unspecified: Secondary | ICD-10-CM

## 2020-03-31 DIAGNOSIS — N319 Neuromuscular dysfunction of bladder, unspecified: Secondary | ICD-10-CM | POA: Diagnosis not present

## 2020-03-31 DIAGNOSIS — G8220 Paraplegia, unspecified: Secondary | ICD-10-CM | POA: Diagnosis not present

## 2020-03-31 DIAGNOSIS — N400 Enlarged prostate without lower urinary tract symptoms: Secondary | ICD-10-CM | POA: Diagnosis not present

## 2020-03-31 DIAGNOSIS — D509 Iron deficiency anemia, unspecified: Secondary | ICD-10-CM | POA: Diagnosis not present

## 2020-03-31 DIAGNOSIS — I10 Essential (primary) hypertension: Secondary | ICD-10-CM | POA: Diagnosis not present

## 2020-03-31 NOTE — Telephone Encounter (Signed)
Please schedule as MD recommends and inform patient of appt details. 

## 2020-03-31 NOTE — Telephone Encounter (Signed)
-----   Message from Earlie Server, MD sent at 03/30/2020 11:03 PM EDT ----- Regarding: RE: Referral from [facility] Thank you [place],  Team, please arrange him to see me lab -cbc smear, iron tibc ferritin, cmp, hold tube 1 day prior to MD follow up +/- Venofer. Thanks.  ----- Message ----- From: Kelly Splinter Sent: 03/30/2020  10:33 PM EDT To: Maryann Conners, NT, Evelina Dun, RN, # Subject: Referral from [facility],  We received a stat referral for the patient however he was previously treated by Dr. Tasia Catchings. Last seen 04/30/2017 which puts him within the 3 year time frame.  Referral with recent labs scanned to the media tab for review. Thanks, [employer]

## 2020-03-31 NOTE — Telephone Encounter (Signed)
Done Pt has been sched as requested Pt will RTC for labs on 10/26 and for MD/+/- Venofer on 10/29 Pt is aware of the sched dates and times of his appts.

## 2020-03-31 NOTE — Telephone Encounter (Signed)
Lab orders entered

## 2020-04-01 ENCOUNTER — Other Ambulatory Visit: Payer: Self-pay

## 2020-04-01 ENCOUNTER — Inpatient Hospital Stay: Payer: Medicare HMO | Attending: Oncology

## 2020-04-01 DIAGNOSIS — S24104S Unspecified injury at T11-T12 level of thoracic spinal cord, sequela: Secondary | ICD-10-CM | POA: Diagnosis not present

## 2020-04-01 DIAGNOSIS — N400 Enlarged prostate without lower urinary tract symptoms: Secondary | ICD-10-CM | POA: Diagnosis not present

## 2020-04-01 DIAGNOSIS — Z79899 Other long term (current) drug therapy: Secondary | ICD-10-CM | POA: Diagnosis not present

## 2020-04-01 DIAGNOSIS — N319 Neuromuscular dysfunction of bladder, unspecified: Secondary | ICD-10-CM | POA: Diagnosis not present

## 2020-04-01 DIAGNOSIS — D509 Iron deficiency anemia, unspecified: Secondary | ICD-10-CM | POA: Diagnosis not present

## 2020-04-01 DIAGNOSIS — I10 Essential (primary) hypertension: Secondary | ICD-10-CM | POA: Diagnosis not present

## 2020-04-01 DIAGNOSIS — M869 Osteomyelitis, unspecified: Secondary | ICD-10-CM | POA: Diagnosis not present

## 2020-04-01 DIAGNOSIS — L89154 Pressure ulcer of sacral region, stage 4: Secondary | ICD-10-CM | POA: Diagnosis not present

## 2020-04-01 DIAGNOSIS — D649 Anemia, unspecified: Secondary | ICD-10-CM

## 2020-04-01 DIAGNOSIS — G8220 Paraplegia, unspecified: Secondary | ICD-10-CM | POA: Diagnosis not present

## 2020-04-01 DIAGNOSIS — J309 Allergic rhinitis, unspecified: Secondary | ICD-10-CM | POA: Diagnosis not present

## 2020-04-01 LAB — COMPREHENSIVE METABOLIC PANEL
ALT: 7 U/L (ref 0–44)
AST: 14 U/L — ABNORMAL LOW (ref 15–41)
Albumin: 2.3 g/dL — ABNORMAL LOW (ref 3.5–5.0)
Alkaline Phosphatase: 48 U/L (ref 38–126)
Anion gap: 10 (ref 5–15)
BUN: 6 mg/dL — ABNORMAL LOW (ref 8–23)
CO2: 29 mmol/L (ref 22–32)
Calcium: 7.9 mg/dL — ABNORMAL LOW (ref 8.9–10.3)
Chloride: 100 mmol/L (ref 98–111)
Creatinine, Ser: 0.45 mg/dL — ABNORMAL LOW (ref 0.61–1.24)
GFR, Estimated: >60 mL/min (ref >60)
Glucose, Bld: 103 mg/dL — ABNORMAL HIGH (ref 70–99)
Potassium: 3.3 mmol/L — ABNORMAL LOW (ref 3.5–5.1)
Sodium: 139 mmol/L (ref 135–145)
Total Bilirubin: 0.6 mg/dL (ref 0.3–1.2)
Total Protein: 7.2 g/dL (ref 6.5–8.1)

## 2020-04-01 LAB — SAMPLE TO BLOOD BANK

## 2020-04-01 LAB — TECHNOLOGIST SMEAR REVIEW: Plt Morphology: INCREASED

## 2020-04-01 LAB — CBC WITH DIFFERENTIAL/PLATELET
Abs Immature Granulocytes: 0.04 10*3/uL (ref 0.00–0.07)
Basophils Absolute: 0 10*3/uL (ref 0.0–0.1)
Basophils Relative: 0 %
Eosinophils Absolute: 0.2 10*3/uL (ref 0.0–0.5)
Eosinophils Relative: 2 %
HCT: 26 % — ABNORMAL LOW (ref 39.0–52.0)
Hemoglobin: 7.6 g/dL — ABNORMAL LOW (ref 13.0–17.0)
Immature Granulocytes: 0 %
Lymphocytes Relative: 20 %
Lymphs Abs: 2 10*3/uL (ref 0.7–4.0)
MCH: 18.6 pg — ABNORMAL LOW (ref 26.0–34.0)
MCHC: 29.2 g/dL — ABNORMAL LOW (ref 30.0–36.0)
MCV: 63.6 fL — ABNORMAL LOW (ref 80.0–100.0)
Monocytes Absolute: 1 10*3/uL (ref 0.1–1.0)
Monocytes Relative: 10 %
Neutro Abs: 6.6 10*3/uL (ref 1.7–7.7)
Neutrophils Relative %: 68 %
Platelets: 763 10*3/uL — ABNORMAL HIGH (ref 150–400)
RBC: 4.09 MIL/uL — ABNORMAL LOW (ref 4.22–5.81)
RDW: 18.7 % — ABNORMAL HIGH (ref 11.5–15.5)
WBC: 9.8 10*3/uL (ref 4.0–10.5)
nRBC: 0 % (ref 0.0–0.2)

## 2020-04-01 LAB — IRON AND TIBC
Iron: 12 ug/dL — ABNORMAL LOW (ref 45–182)
Saturation Ratios: 7 % — ABNORMAL LOW (ref 17.9–39.5)
TIBC: 167 ug/dL — ABNORMAL LOW (ref 250–450)
UIBC: 155 ug/dL

## 2020-04-01 LAB — FERRITIN: Ferritin: 141 ng/mL (ref 24–336)

## 2020-04-02 ENCOUNTER — Other Ambulatory Visit: Payer: Self-pay

## 2020-04-02 DIAGNOSIS — D649 Anemia, unspecified: Secondary | ICD-10-CM

## 2020-04-02 LAB — RETIC PANEL
Immature Retic Fract: 22.3 % — ABNORMAL HIGH (ref 2.3–15.9)
RBC.: 4.12 MIL/uL — ABNORMAL LOW (ref 4.22–5.81)
Retic Count, Absolute: 58.5 10*3/uL (ref 19.0–186.0)
Retic Ct Pct: 1.4 % (ref 0.4–3.1)
Reticulocyte Hemoglobin: 18.2 pg — ABNORMAL LOW (ref >27.9)

## 2020-04-03 ENCOUNTER — Inpatient Hospital Stay: Payer: Medicare HMO

## 2020-04-03 ENCOUNTER — Inpatient Hospital Stay (HOSPITAL_BASED_OUTPATIENT_CLINIC_OR_DEPARTMENT_OTHER): Payer: Medicare HMO | Admitting: Oncology

## 2020-04-03 ENCOUNTER — Encounter: Payer: Self-pay | Admitting: Oncology

## 2020-04-03 ENCOUNTER — Other Ambulatory Visit: Payer: Self-pay

## 2020-04-03 VITALS — BP 108/64 | HR 94

## 2020-04-03 VITALS — BP 116/64 | HR 86 | Temp 97.4°F | Resp 18

## 2020-04-03 DIAGNOSIS — R319 Hematuria, unspecified: Secondary | ICD-10-CM

## 2020-04-03 DIAGNOSIS — D500 Iron deficiency anemia secondary to blood loss (chronic): Secondary | ICD-10-CM

## 2020-04-03 DIAGNOSIS — D509 Iron deficiency anemia, unspecified: Secondary | ICD-10-CM | POA: Diagnosis not present

## 2020-04-03 DIAGNOSIS — D696 Thrombocytopenia, unspecified: Secondary | ICD-10-CM | POA: Diagnosis not present

## 2020-04-03 DIAGNOSIS — D508 Other iron deficiency anemias: Secondary | ICD-10-CM

## 2020-04-03 MED ORDER — IRON SUCROSE 20 MG/ML IV SOLN
200.0000 mg | Freq: Once | INTRAVENOUS | Status: AC
  Administered 2020-04-03: 200 mg via INTRAVENOUS
  Filled 2020-04-03: qty 10

## 2020-04-03 MED ORDER — SODIUM CHLORIDE 0.9 % IV SOLN
Freq: Once | INTRAVENOUS | Status: AC
  Filled 2020-04-03: qty 250

## 2020-04-03 MED ORDER — SODIUM CHLORIDE 0.9 % IV SOLN: 200.0000 mg | Freq: Once | INTRAVENOUS | Status: DC

## 2020-04-03 NOTE — Progress Notes (Signed)
Patient here for follow up. Repots having recent UTI and covid infection, which messed his appetite up, but is slowly recovering.

## 2020-04-03 NOTE — Progress Notes (Signed)
Hematology/Oncology Follow Up Note [facility] [phone] [phone]  CONSULT NOTE Patient Care Team: [name], MD as PCP - General (Family Medicine)  CHIEF COMPLAINTS/PURPOSE OF CONSULTATION:   anemia   HISTORY OF PRESENTING ILLNESS:  Bradley [name] 74 y.o.  male with PMH listed as below who was referred to me for evaluation of iron deficiency anemia.  Patient is paraplegic, also has chronic pressure ulcer, recently just had colostomy. He self catheterizes. Patient feels that his energy levels have been the same. He always has mild fatigue.  He has BPH for which he take finasteride. He has not see his urologist for a while.  He was seen by [facility] for elevated ESR and his chronic hand joint tenderness. He was not considered to have rheumatoid arthritis.   INTERVAL HISTORY Patient was seen by me 3 years ago.  He lost follow-up. Patient was referred back to reestablish care with me due to worsening of anemia. Patient reports hematuria.  Patient has had gross hematuria, onset of symptoms early October.  He has been seen by urologist.  Patient was given a course of antibiotics for urinary tract infection.  There was plan to repeat urine study and if persistent hematuria, patient will need to proceed hematuria work-up.     ROS:  Review of Systems  Constitutional: Positive for fatigue.  HENT:  Negative.  Negative for lump/mass and mouth sores.   Eyes: Negative.  Negative for eye problems.  Respiratory: Negative.  Negative for chest tightness and cough.   Cardiovascular: Negative.  Negative for chest pain.  Gastrointestinal: Negative.  Negative for abdominal distention and abdominal pain.       Colostomy  Endocrine: Negative.  Negative for hot flashes.  Genitourinary: Negative for hematuria.        Self catherize  Musculoskeletal: Positive for arthralgias. Negative for back pain.       Paraplegic  Skin: Positive for wound.  Negative for itching and rash.       Chronic pressure ulcer  Neurological: Negative.  Negative for dizziness.  Hematological: Negative.  Negative for adenopathy.  Psychiatric/Behavioral: Negative.  The patient is not nervous/anxious.     MEDICAL HISTORY:  Past Medical History:  Diagnosis Date  . Anemia   . Arthritis   . Paralysis of both lower limbs ([place])   . Sacral decubitus ulcer     SURGICAL HISTORY: Past Surgical History:  Procedure Laterality Date  . COLONOSCOPY  06/06/2014  . INCISION AND DRAINAGE OF WOUND N/A 06/04/2018   Procedure: IRRIGATION AND DEBRIDEMENT WOUND;  Surgeon: Olean Ree, MD;  Location: [facility] ORS;  Service: General;  Laterality: N/A;  . TONSILLECTOMY      SOCIAL HISTORY: Social History   Socioeconomic History  . Marital status: Married    Spouse name: Not on file  . Number of children: Not on file  . Years of education: Not on file  . Highest education level: Not on file  Occupational History  . Not on file  Tobacco Use  . Smoking status: Former Smoker    Packs/day: 0.50    Years: 5.00    Pack years: 2.50    Quit date: 1972    Years since quitting: 49.8  . Smokeless tobacco: Never Used  Vaping Use  . Vaping Use: Never used  Substance and Sexual Activity  . Alcohol use: No  . Drug use: No  . Sexual activity: Not on file  Other Topics Concern  . Not on file  Social History Narrative  . Not on file   Social Determinants of Health   Financial Resource Strain:   . Difficulty of Paying Living Expenses: Not on file  Food Insecurity:   . Worried About Charity fundraiser in the Last Year: Not on file  . Ran Out of Food in the Last Year: Not on file  Transportation Needs:   . Lack of Transportation (Medical): Not on file  . Lack of Transportation (Non-Medical): Not on file  Physical Activity:   . Days of Exercise per Week: Not on file  . Minutes of Exercise per Session: Not on file  Stress:   . Feeling of Stress : Not on file    Social Connections:   . Frequency of Communication with Friends and Family: Not on file  . Frequency of Social Gatherings with Friends and Family: Not on file  . Attends Religious Services: Not on file  . Active Member of Clubs or Organizations: Not on file  . Attends Archivist Meetings: Not on file  . Marital Status: Not on file  Intimate Partner Violence:   . Fear of Current or Ex-Partner: Not on file  . Emotionally Abused: Not on file  . Physically Abused: Not on file  . Sexually Abused: Not on file    FAMILY HISTORY: Family History  Problem Relation Age of Onset  . Breast cancer Mother     ALLERGIES:  has No Known Allergies.  MEDICATIONS:  Current Outpatient Medications  Medication Sig Dispense Refill  . acetaminophen (TYLENOL) 500 MG tablet Take 1,000 mg by mouth 3 (three) times daily.     Marland Kitchen amLODipine (NORVASC) 5 MG tablet Take 5 mg by mouth daily.    . baclofen (LIORESAL) 10 MG tablet Take 20 mg by mouth 3 (three) times daily.    . finasteride (PROSCAR) 5 MG tablet Take 5 mg by mouth daily.    . fluticasone (FLONASE) 50 MCG/ACT nasal spray Place 1 spray into both nostrils daily as needed for allergies.    . Multiple Vitamin (MULTI-VITAMINS) TABS Take 1 tablet by mouth daily.     . polyethylene glycol powder (GLYCOLAX/MIRALAX) powder Take 17 g by mouth daily as needed for mild constipation.     . vitamin C (ASCORBIC ACID) 500 MG tablet Take 500 mg by mouth 2 (two) times daily.     No current facility-administered medications for this visit.      Marland Kitchen  PHYSICAL EXAMINATION: ECOG PERFORMANCE STATUS: 2 - Symptomatic, <50% confined to bed Vitals:   04/03/20 1043  BP: 116/64  Pulse: 86  Resp: 18  Temp: (!) 97.4 F (36.3 C)   Filed Weights   Physical Exam Constitutional:      General: He is not in acute distress.    Appearance: He is not diaphoretic.  HENT:     Head: Normocephalic and atraumatic.     Nose: Nose normal.     Mouth/Throat:      Pharynx: No oropharyngeal exudate.  Eyes:     General: No scleral icterus.    Pupils: Pupils are equal, round, and reactive to light.  Cardiovascular:     Rate and Rhythm: Normal rate and regular rhythm.     Heart sounds: No murmur heard.   Pulmonary:     Effort: Pulmonary effort is normal. No respiratory distress.     Breath sounds: No rales.  Chest:     Chest wall: No tenderness.  Abdominal:     General:  There is no distension.     Palpations: Abdomen is soft.     Tenderness: There is no abdominal tenderness.  Musculoskeletal:     Cervical back: Normal range of motion and neck supple.     Comments: Bilateral ulnar deviation of metacarpophalangeal joints and joint tenderness.    Skin:    General: Skin is warm and dry.     Findings: No erythema.  Neurological:     Mental Status: He is alert and oriented to person, place, and time.     Cranial Nerves: No cranial nerve deficit.     Motor: No abnormal muscle tone.     Coordination: Coordination normal.     Comments:  paraplegic  Psychiatric:        Mood and Affect: Affect normal.       LABORATORY DATA:  I have reviewed the data as listed Lab Results  Component Value Date   WBC 9.8 04/01/2020   HGB 7.6 (L) 04/01/2020   HCT 26.0 (L) 04/01/2020   MCV 63.6 (L) 04/01/2020   PLT 763 (H) 04/01/2020   Recent Labs    06/02/19 1658 06/02/19 1658 06/03/19 1010 06/03/19 1010 06/04/19 0417 06/05/19 0655 04/01/20 1335  NA 138   < > 140  --  140  --  139  K 3.4*   < > 3.1*  --  3.5  --  3.3*  CL 101   < > 106  --  108  --  100  CO2 24   < > 26  --  24  --  29  GLUCOSE 84   < > 103*  --  98  --  103*  BUN 9   < > 6*  --  6*  --  6*  CREATININE 0.46*   < > 0.43*   < > 0.49* 0.35* 0.45*  CALCIUM 8.1*   < > 7.9*  --  8.0*  --  7.9*  GFRNONAA >60   < > >60   < > >60 >60 >60  GFRAA >60   < > >60  --  >60 >60  --   PROT 7.4  --  6.6  --   --   --  7.2  ALBUMIN 2.7*  --  2.3*  --   --   --  2.3*  AST 16  --  19  --   --   --   14*  ALT 8  --  10  --   --   --  7  ALKPHOS 62  --  55  --   --   --  48  BILITOT 0.5  --  0.7  --   --   --  0.6   < > = values in this interval not displayed.   Iron/TIBC/Ferritin/ %Sat    Component Value Date/Time   IRON 12 (L) 04/01/2020 1335   TIBC 167 (L) 04/01/2020 1335   FERRITIN 141 04/01/2020 1335   IRONPCTSAT 7 (L) 04/01/2020 1335   Negative stool occult.   ASSESSMENT & PLAN:  1. Iron deficiency anemia due to chronic blood loss   2. Thrombocytopenia ([place])   3. Hematuria, unspecified type    #Anemia, Labs are reviewed and discussed with patient.  Patient has decreased iron saturation, decreased reticulocyte hemoglobin.  Consistent with iron deficiency. Ferritin is probably falsely elevated due to chronic inflammation. Recommend IV Venofer treatments.  He previously has had IV Venofer treatments in 2018 and tolerated well.Proceed with  Venofer 200 mg x 1 today. We will repeat Venofer 200 mg x 2 next week, 48 hours apart. Repeat Venofer 200 mg x 1 following week.  # Thrombocytosis likely due to iron deficiency.  Continue to monitor. #Hematuria, follow-up with urology for hematuria work-up. All questions were answered. The patient knows to call the clinic with any problems questions or concerns.  Return of visit: 5-6 weeks for re-evaluation and repeat labs  Earlie Server, MD, PhD Hematology Oncology [facility] Pager- [phone] 04/03/2020

## 2020-04-06 ENCOUNTER — Inpatient Hospital Stay: Payer: Medicare HMO | Attending: Oncology

## 2020-04-06 ENCOUNTER — Other Ambulatory Visit: Payer: Self-pay

## 2020-04-06 VITALS — BP 121/67 | HR 102 | Temp 99.1°F | Resp 18

## 2020-04-06 DIAGNOSIS — I10 Essential (primary) hypertension: Secondary | ICD-10-CM | POA: Diagnosis not present

## 2020-04-06 DIAGNOSIS — Z79899 Other long term (current) drug therapy: Secondary | ICD-10-CM | POA: Diagnosis not present

## 2020-04-06 DIAGNOSIS — D509 Iron deficiency anemia, unspecified: Secondary | ICD-10-CM | POA: Diagnosis not present

## 2020-04-06 DIAGNOSIS — N319 Neuromuscular dysfunction of bladder, unspecified: Secondary | ICD-10-CM | POA: Diagnosis not present

## 2020-04-06 DIAGNOSIS — N400 Enlarged prostate without lower urinary tract symptoms: Secondary | ICD-10-CM | POA: Diagnosis not present

## 2020-04-06 DIAGNOSIS — S24104S Unspecified injury at T11-T12 level of thoracic spinal cord, sequela: Secondary | ICD-10-CM | POA: Diagnosis not present

## 2020-04-06 DIAGNOSIS — G8220 Paraplegia, unspecified: Secondary | ICD-10-CM | POA: Diagnosis not present

## 2020-04-06 DIAGNOSIS — D508 Other iron deficiency anemias: Secondary | ICD-10-CM

## 2020-04-06 DIAGNOSIS — L89154 Pressure ulcer of sacral region, stage 4: Secondary | ICD-10-CM | POA: Diagnosis not present

## 2020-04-06 DIAGNOSIS — J309 Allergic rhinitis, unspecified: Secondary | ICD-10-CM | POA: Diagnosis not present

## 2020-04-06 MED ORDER — SODIUM CHLORIDE 0.9 % IV SOLN: 200.0000 mg | Freq: Once | INTRAVENOUS | Status: DC

## 2020-04-06 MED ORDER — IRON SUCROSE 20 MG/ML IV SOLN
200.0000 mg | Freq: Once | INTRAVENOUS | Status: AC
  Administered 2020-04-06: 200 mg via INTRAVENOUS
  Filled 2020-04-06: qty 10

## 2020-04-06 MED ORDER — SODIUM CHLORIDE 0.9 % IV SOLN
Freq: Once | INTRAVENOUS | Status: AC
  Filled 2020-04-06: qty 250

## 2020-04-06 NOTE — Progress Notes (Signed)
Patient tolerated infusion well. Denies any s/s. Patient and VSS. Discharged home.

## 2020-04-07 ENCOUNTER — Ambulatory Visit: Payer: Medicare HMO | Admitting: Physician Assistant

## 2020-04-08 ENCOUNTER — Other Ambulatory Visit: Payer: Self-pay | Admitting: Family Medicine

## 2020-04-08 DIAGNOSIS — N400 Enlarged prostate without lower urinary tract symptoms: Secondary | ICD-10-CM | POA: Diagnosis not present

## 2020-04-08 DIAGNOSIS — G8220 Paraplegia, unspecified: Secondary | ICD-10-CM | POA: Diagnosis not present

## 2020-04-08 DIAGNOSIS — R310 Gross hematuria: Secondary | ICD-10-CM

## 2020-04-08 DIAGNOSIS — J309 Allergic rhinitis, unspecified: Secondary | ICD-10-CM | POA: Diagnosis not present

## 2020-04-08 DIAGNOSIS — Z79899 Other long term (current) drug therapy: Secondary | ICD-10-CM | POA: Diagnosis not present

## 2020-04-08 DIAGNOSIS — D509 Iron deficiency anemia, unspecified: Secondary | ICD-10-CM | POA: Diagnosis not present

## 2020-04-08 DIAGNOSIS — N319 Neuromuscular dysfunction of bladder, unspecified: Secondary | ICD-10-CM | POA: Diagnosis not present

## 2020-04-08 DIAGNOSIS — L89154 Pressure ulcer of sacral region, stage 4: Secondary | ICD-10-CM | POA: Diagnosis not present

## 2020-04-08 DIAGNOSIS — I10 Essential (primary) hypertension: Secondary | ICD-10-CM | POA: Diagnosis not present

## 2020-04-08 DIAGNOSIS — S24104S Unspecified injury at T11-T12 level of thoracic spinal cord, sequela: Secondary | ICD-10-CM | POA: Diagnosis not present

## 2020-04-09 ENCOUNTER — Other Ambulatory Visit: Payer: Medicare HMO

## 2020-04-09 ENCOUNTER — Inpatient Hospital Stay: Payer: Medicare HMO

## 2020-04-09 ENCOUNTER — Other Ambulatory Visit: Payer: Self-pay

## 2020-04-09 VITALS — BP 120/72 | HR 92 | Resp 18

## 2020-04-09 DIAGNOSIS — R310 Gross hematuria: Secondary | ICD-10-CM | POA: Diagnosis not present

## 2020-04-09 DIAGNOSIS — D508 Other iron deficiency anemias: Secondary | ICD-10-CM

## 2020-04-09 DIAGNOSIS — D509 Iron deficiency anemia, unspecified: Secondary | ICD-10-CM | POA: Diagnosis not present

## 2020-04-09 MED ORDER — SODIUM CHLORIDE 0.9 % IV SOLN: 200.0000 mg | Freq: Once | INTRAVENOUS | Status: DC

## 2020-04-09 MED ORDER — SODIUM CHLORIDE 0.9 % IV SOLN
Freq: Once | INTRAVENOUS | Status: AC
  Filled 2020-04-09: qty 250

## 2020-04-09 MED ORDER — IRON SUCROSE 20 MG/ML IV SOLN
200.0000 mg | Freq: Once | INTRAVENOUS | Status: AC
  Administered 2020-04-09: 200 mg via INTRAVENOUS
  Filled 2020-04-09: qty 10

## 2020-04-10 ENCOUNTER — Other Ambulatory Visit: Payer: Self-pay | Admitting: Urology

## 2020-04-10 DIAGNOSIS — Z79899 Other long term (current) drug therapy: Secondary | ICD-10-CM | POA: Diagnosis not present

## 2020-04-10 DIAGNOSIS — N319 Neuromuscular dysfunction of bladder, unspecified: Secondary | ICD-10-CM | POA: Diagnosis not present

## 2020-04-10 DIAGNOSIS — I10 Essential (primary) hypertension: Secondary | ICD-10-CM | POA: Diagnosis not present

## 2020-04-10 DIAGNOSIS — D509 Iron deficiency anemia, unspecified: Secondary | ICD-10-CM | POA: Diagnosis not present

## 2020-04-10 DIAGNOSIS — N400 Enlarged prostate without lower urinary tract symptoms: Secondary | ICD-10-CM | POA: Diagnosis not present

## 2020-04-10 DIAGNOSIS — G8220 Paraplegia, unspecified: Secondary | ICD-10-CM | POA: Diagnosis not present

## 2020-04-10 DIAGNOSIS — J309 Allergic rhinitis, unspecified: Secondary | ICD-10-CM | POA: Diagnosis not present

## 2020-04-10 DIAGNOSIS — L89154 Pressure ulcer of sacral region, stage 4: Secondary | ICD-10-CM | POA: Diagnosis not present

## 2020-04-10 DIAGNOSIS — S24104S Unspecified injury at T11-T12 level of thoracic spinal cord, sequela: Secondary | ICD-10-CM | POA: Diagnosis not present

## 2020-04-10 LAB — URINALYSIS, COMPLETE
Bilirubin, UA: NEGATIVE
Glucose, UA: NEGATIVE
Ketones, UA: NEGATIVE
Nitrite, UA: POSITIVE — AB
Protein,UA: NEGATIVE
Specific Gravity, UA: 1.02 (ref 1.005–1.030)
Urobilinogen, Ur: >8 mg/dL — ABNORMAL HIGH (ref 0.2–1.0)
pH, UA: 6.5 (ref 5.0–7.5)

## 2020-04-10 LAB — MICROSCOPIC EXAMINATION: WBC, UA: >30 /hpf — AB (ref 0–5)

## 2020-04-13 ENCOUNTER — Telehealth: Payer: Self-pay | Admitting: Family Medicine

## 2020-04-13 LAB — CULTURE, URINE COMPREHENSIVE

## 2020-04-13 MED ORDER — AMOXICILLIN-POT CLAVULANATE 875-125 MG PO TABS: 1.0000 | ORAL_TABLET | Freq: Two times a day (BID) | ORAL | 0 refills | Status: DC

## 2020-04-13 NOTE — Telephone Encounter (Signed)
-----   Message from Nori Riis, PA-C sent at 04/13/2020 10:26 AM EST ----- Please let Bradley [name] know that his urine culture is positive and we need for him to start Augmentin 875/125, twice daily for seven days.  He also needs to come in for an office visit as this is a highly resistant organism.

## 2020-04-13 NOTE — Telephone Encounter (Signed)
Patient notified and ABX sent to the pharmacy and appointment has been made.

## 2020-04-15 ENCOUNTER — Other Ambulatory Visit: Payer: Self-pay

## 2020-04-15 ENCOUNTER — Encounter: Payer: Medicare HMO | Attending: Internal Medicine | Admitting: Internal Medicine

## 2020-04-15 DIAGNOSIS — D649 Anemia, unspecified: Secondary | ICD-10-CM | POA: Diagnosis not present

## 2020-04-15 DIAGNOSIS — M069 Rheumatoid arthritis, unspecified: Secondary | ICD-10-CM | POA: Diagnosis not present

## 2020-04-15 DIAGNOSIS — L97129 Non-pressure chronic ulcer of left thigh with unspecified severity: Secondary | ICD-10-CM | POA: Diagnosis not present

## 2020-04-15 DIAGNOSIS — D509 Iron deficiency anemia, unspecified: Secondary | ICD-10-CM | POA: Diagnosis not present

## 2020-04-15 DIAGNOSIS — L89153 Pressure ulcer of sacral region, stage 3: Secondary | ICD-10-CM | POA: Insufficient documentation

## 2020-04-15 DIAGNOSIS — I10 Essential (primary) hypertension: Secondary | ICD-10-CM | POA: Diagnosis not present

## 2020-04-15 DIAGNOSIS — N400 Enlarged prostate without lower urinary tract symptoms: Secondary | ICD-10-CM | POA: Diagnosis not present

## 2020-04-15 DIAGNOSIS — Z933 Colostomy status: Secondary | ICD-10-CM | POA: Insufficient documentation

## 2020-04-15 DIAGNOSIS — L89154 Pressure ulcer of sacral region, stage 4: Secondary | ICD-10-CM | POA: Insufficient documentation

## 2020-04-15 DIAGNOSIS — J309 Allergic rhinitis, unspecified: Secondary | ICD-10-CM | POA: Diagnosis not present

## 2020-04-15 DIAGNOSIS — G8220 Paraplegia, unspecified: Secondary | ICD-10-CM | POA: Insufficient documentation

## 2020-04-15 DIAGNOSIS — L97128 Non-pressure chronic ulcer of left thigh with other specified severity: Secondary | ICD-10-CM | POA: Diagnosis not present

## 2020-04-15 DIAGNOSIS — Z79899 Other long term (current) drug therapy: Secondary | ICD-10-CM | POA: Diagnosis not present

## 2020-04-15 DIAGNOSIS — N319 Neuromuscular dysfunction of bladder, unspecified: Secondary | ICD-10-CM | POA: Diagnosis not present

## 2020-04-15 DIAGNOSIS — S24104S Unspecified injury at T11-T12 level of thoracic spinal cord, sequela: Secondary | ICD-10-CM | POA: Diagnosis not present

## 2020-04-16 ENCOUNTER — Other Ambulatory Visit: Payer: Self-pay | Admitting: Internal Medicine

## 2020-04-16 DIAGNOSIS — N319 Neuromuscular dysfunction of bladder, unspecified: Secondary | ICD-10-CM | POA: Diagnosis not present

## 2020-04-16 DIAGNOSIS — B999 Unspecified infectious disease: Secondary | ICD-10-CM

## 2020-04-16 NOTE — Progress Notes (Signed)
Bradley [name], Bradley [name] ([id number]) Visit Report for 04/15/2020 Arrival Information Details Patient Name: Bradley [name]. Date of Service: 04/15/2020 2:15 PM Medical Record Number: [id number] Patient Account Number: [id number] Date of Birth/Sex: May 01, 1946 (74 y.o. M) Treating RN: Dolan Amen Primary Care Binh Doten: [name] Other Clinician: Referring Nevyn Bossman: [name] Treating Trellis Guirguis/Extender: Tito Dine in Treatment: 65 Visit Information History Since Last Visit Pain Present Now: No Patient Arrived: Wheel Chair Arrival Time: 14:21 Accompanied By: self Transfer Assistance: [place] Patient Identification Verified: Yes Secondary Verification Process Completed: Yes Patient Requires Transmission-Based Precautions: No Patient Has Alerts: No Electronic Signature(s) Signed: 04/15/2020 4:38:43 PM By: Georges Mouse, Minus Breeding Entered By: Georges Mouse, Minus Breeding on 04/15/2020 14:21:52 Bradley [name] ([id number]) -------------------------------------------------------------------------------- Clinic Level of Care Assessment Details Patient Name: Bradley [name]. Date of Service: 04/15/2020 2:15 PM Medical Record Number: [id number] Patient Account Number: [id number] Date of Birth/Sex: 07-Apr-1946 (74 y.o. M) Treating RN: Cornell Barman Primary Care Wendelin Reader: [name] Other Clinician: Referring Koki Buxton: [name] Treating [name] Thornberry/Extender: Tito Dine in Treatment: 66 Clinic Level of Care Assessment Items TOOL 4 Quantity Score []  - Use when only an EandM is performed on FOLLOW-UP visit 0 ASSESSMENTS - Nursing Assessment / Reassessment X - Reassessment of Co-morbidities (includes updates in patient status) 1 10 X- 1 5 Reassessment of Adherence to Treatment Plan ASSESSMENTS - Wound and Skin Assessment / Reassessment []  - Simple Wound Assessment / Reassessment - one wound 0 X- 2 5 Complex Wound Assessment / Reassessment -  multiple wounds []  - 0 Dermatologic / Skin Assessment (not related to wound area) ASSESSMENTS - Focused Assessment []  - Circumferential Edema Measurements - multi extremities 0 []  - 0 Nutritional Assessment / Counseling / Intervention []  - 0 Lower Extremity Assessment (monofilament, tuning fork, pulses) []  - 0 Peripheral Arterial Disease Assessment (using hand held doppler) ASSESSMENTS - Ostomy and/or Continence Assessment and Care []  - Incontinence Assessment and Management 0 []  - 0 Ostomy Care Assessment and Management (repouching, etc.) PROCESS - Coordination of Care []  - Simple Patient / Family Education for ongoing care 0 X- 1 20 Complex (extensive) Patient / Family Education for ongoing care X- 1 10 Staff obtains Consents, Records, Test Results / Process Orders []  - 0 Staff telephones HHA, Nursing Homes / Clarify orders / etc []  - 0 Routine Transfer to another Facility (non-emergent condition) []  - 0 Routine Hospital Admission (non-emergent condition) []  - 0 New Admissions / Biomedical engineer / Ordering NPWT, Apligraf, etc. []  - 0 Emergency Hospital Admission (emergent condition) []  - 0 Simple Discharge Coordination X- 1 15 Complex (extensive) Discharge Coordination PROCESS - Special Needs []  - Pediatric / Minor Patient Management 0 []  - 0 Isolation Patient Management []  - 0 Hearing / Language / Visual special needs []  - 0 Assessment of Community assistance (transportation, D/C planning, etc.) []  - 0 Additional assistance / Altered mentation []  - 0 Support Surface(s) Assessment (bed, cushion, seat, etc.) INTERVENTIONS - Wound Cleansing / Measurement HINES, KLOSS A. ([id number]) []  - 0 Simple Wound Cleansing - one wound X- 2 5 Complex Wound Cleansing - multiple wounds X- 1 5 Wound Imaging (photographs - any number of wounds) []  - 0 Wound Tracing (instead of photographs) []  - 0 Simple Wound Measurement - one wound X- 2 5 Complex Wound Measurement  - multiple wounds INTERVENTIONS - Wound Dressings []  - Small Wound Dressing one or multiple wounds 0 X- 1 15 Medium Wound Dressing one or multiple wounds X- 1 20 Large Wound  Dressing one or multiple wounds []  - 0 Application of Medications - topical []  - 0 Application of Medications - injection INTERVENTIONS - Miscellaneous []  - External ear exam 0 []  - 0 Specimen Collection (cultures, biopsies, blood, body fluids, etc.) []  - 0 Specimen(s) / Culture(s) sent or taken to Lab for analysis X- 1 10 Patient Transfer (multiple staff / Civil Service fast streamer / Similar devices) []  - 0 Simple Staple / Suture removal (25 or less) []  - 0 Complex Staple / Suture removal (26 or more) []  - 0 Hypo / Hyperglycemic Management (close monitor of Blood Glucose) []  - 0 Ankle / Brachial Index (ABI) - do not check if billed separately X- 1 5 Vital Signs Has the patient been seen at the hospital within the last three years: Yes Total Score: 145 Level Of Care: New/Established - Level 4 Electronic Signature(s) Signed: 04/15/2020 6:49:12 PM By: Gretta Cool, BSN, RN, CWS, Kim RN, BSN Entered By: Gretta Cool, BSN, RN, CWS, Kim on 04/15/2020 15:30:08 Bradley [name] ([id number]) -------------------------------------------------------------------------------- Encounter Discharge Information Details Patient Name: Bradley Loa A. Date of Service: 04/15/2020 2:15 PM Medical Record Number: [id number] Patient Account Number: [id number] Date of Birth/Sex: 16-Feb-1946 (74 y.o. M) Treating RN: Cornell Barman Primary Care Copper Kirtley: [name] Other Clinician: Referring Gweneth Fredlund: [name] Treating Keah Lamba/Extender: Tito Dine in Treatment: 27 Encounter Discharge Information Items Discharge Condition: Stable Ambulatory Status: Wheelchair Discharge Destination: Home Transportation: Private Auto Accompanied By: self Schedule Follow-up Appointment: Yes Clinical Summary of Care: Electronic  Signature(s) Signed: 04/15/2020 6:28:36 PM By: Gretta Cool, BSN, RN, CWS, Kim RN, BSN Entered By: Gretta Cool, BSN, RN, CWS, Kim on 04/15/2020 18:28:36 Bradley [name] ([id number]) -------------------------------------------------------------------------------- Lower Extremity Assessment Details Patient Name: Bradley [name], Bradley A. Date of Service: 04/15/2020 2:15 PM Medical Record Number: [id number] Patient Account Number: [id number] Date of Birth/Sex: [birth year] (74 y.o. M) Treating RN: Cornell Barman Primary Care Ahmod Gillespie: [name] Other Clinician: Referring Terilyn Sano: [name] Treating Camellia Popescu/Extender: Tito Dine in Treatment: 83 Electronic Signature(s) Signed: 04/15/2020 6:49:12 PM By: Gretta Cool, BSN, RN, CWS, Kim RN, BSN Entered By: Gretta Cool, BSN, RN, CWS, Kim on 04/15/2020 15:04:07 Bradley [name] ([id number]) -------------------------------------------------------------------------------- Multi Wound Chart Details Patient Name: Bradley [name], Bradley A. Date of Service: 04/15/2020 2:15 PM Medical Record Number: [id number] Patient Account Number: [id number] Date of Birth/Sex: [birth year] (74 y.o. M) Treating RN: Cornell Barman Primary Care Evaleen Sant: [name] Other Clinician: Referring Emileigh Kellett: [name] Treating Doye Montilla/Extender: Tito Dine in Treatment: 39 Vital Signs Height(in): Pulse(bpm): 96 Weight(lbs): Blood Pressure(mmHg): 107/69 Body Mass Index(BMI): Temperature(F): 98.3 Respiratory Rate(breaths/min): 18 Photos: [10:No Photos] [12:No Photos] [N/A:N/A] Wound Location: [10:Midline Sacrum] [12:Left, Proximal Upper Leg] [N/A:N/A] Wounding Event: [10:Pressure Injury] [12:Gradually Appeared] [N/A:N/A] Primary Etiology: [10:Pressure Ulcer] [12:Pressure Ulcer] [N/A:N/A] Comorbid History: [10:Anemia, Hypertension, History of pressure wounds, Rheumatoid Arthritis, Paraplegia] [12:Anemia, Hypertension, History of pressure wounds, Rheumatoid  Arthritis, Paraplegia] [N/A:N/A] Date Acquired: [10:06/07/2015] [12:03/06/2020] [N/A:N/A] Weeks of Treatment: [10:39] [12:0] [N/A:N/A] Wound Status: [10:Open] [12:Open] [N/A:N/A] Measurements L x W x D (cm) [10:17x10x8] [12:5x4x0.1] [N/A:N/A] Area (cm) : [10:133.518] [12:15.708] [N/A:N/A] Volume (cm) : [10:1068.142] [12:1.571] [N/A:N/A] % Reduction in Area: [10:-159.10%] [12:0.00%] [N/A:N/A] % Reduction in Volume: [10:-245.50%] [12:0.00%] [N/A:N/A] Classification: [10:Category/Stage IV] [12:Unstageable/Unclassified] [N/A:N/A] Exudate Amount: [10:Large] [12:Large] [N/A:N/A] Exudate Type: [10:Serous] [12:Serous] [N/A:N/A] Exudate Color: [10:amber] [12:amber] [N/A:N/A] Wound Margin: [10:Epibole] [12:Flat and Intact] [N/A:N/A] Granulation Amount: [10:Medium (34-66%)] [12:None Present (0%)] [N/A:N/A] Granulation Quality: [10:Pink] [12:N/A] [N/A:N/A] Necrotic Amount: [10:Medium (34-66%)] [12:Large (67-100%)] [N/A:N/A] Exposed Structures: [10:Fat Layer (Subcutaneous Tissue): Yes Muscle: Yes Bone: Yes  Fascia: No Tendon: No Joint: No None] [12:Fat Layer (Subcutaneous Tissue): Yes Fascia: No Tendon: No Muscle: No Joint: No Bone: No Small (1-33%)] [N/A:N/A N/A] Treatment Notes Electronic Signature(s) Signed: 04/16/2020 12:18:17 PM By: Linton Ham MD Entered By: Linton Ham on 04/15/2020 15:29:26 Bradley [name] ([id number]) -------------------------------------------------------------------------------- [place] Details Patient Name: Bradley [name], Bradley A. Date of Service: 04/15/2020 2:15 PM Medical Record Number: [id number] Patient Account Number: [id number] Date of Birth/Sex: 1946/03/25 (74 y.o. M) Treating RN: Cornell Barman Primary Care Carley Strickling: [name] Other Clinician: Referring Arlissa Monteverde: [name] Treating Aubrielle Stroud/Extender: Tito Dine in Treatment: 46 Active Inactive Abuse / Safety / Falls / Self Care Management Nursing  Diagnoses: Impaired physical mobility Goals: Patient will not develop complications from immobility Date Initiated: 07/16/2019 Target Resolution Date: 02/07/2020 Goal Status: Active Interventions: Assess Activities of Daily Living upon admission and as needed Notes: Pressure Nursing Diagnoses: Knowledge deficit related to causes and risk factors for pressure ulcer development Goals: Patient will remain free from development of additional pressure ulcers Date Initiated: 07/16/2019 Target Resolution Date: 02/07/2020 Goal Status: Active Interventions: Provide education on pressure ulcers Notes: Electronic Signature(s) Signed: 04/15/2020 6:49:12 PM By: Gretta Cool, BSN, RN, CWS, Kim RN, BSN Entered By: Gretta Cool, BSN, RN, CWS, Kim on 04/15/2020 15:15:27 Bradley [name] ([id number]) -------------------------------------------------------------------------------- Pain Assessment Details Patient Name: Bradley Loa A. Date of Service: 04/15/2020 2:15 PM Medical Record Number: [id number] Patient Account Number: [id number] Date of Birth/Sex: May 28, 1946 (74 y.o. M) Treating RN: Dolan Amen Primary Care Chace Klippel: [name] Other Clinician: Referring Tonatiuh Mallon: [name] Treating Vaun Hyndman/Extender: Tito Dine in Treatment: 83 Active Problems Location of Pain Severity and Description of Pain Patient Has Paino No Site Locations Rate the pain. Current Pain Level: 0 Pain Management and Medication Current Pain Management: Electronic Signature(s) Signed: 04/15/2020 4:38:43 PM By: Georges Mouse, Minus Breeding Entered By: Georges Mouse, Minus Breeding on 04/15/2020 14:23:52 Bradley [name] ([id number]) -------------------------------------------------------------------------------- Patient/Caregiver Education Details Patient Name: Bradley Loa A. Date of Service: 04/15/2020 2:15 PM Medical Record Number: [id number] Patient Account Number: [id number] Date of Birth/Gender: September 25, 1945  (74 y.o. M) Treating RN: Cornell Barman Primary Care Physician: [name] Other Clinician: Referring Physician: [name] Treating Physician/Extender: Tito Dine in Treatment: 44 Education Assessment Education Provided To: Patient Education Topics Provided Wound/Skin Impairment: Handouts: Caring for Your Ulcer, Other: continue wound care as prescribed Methods: Demonstration, Explain/Verbal Responses: State content correctly Electronic Signature(s) Signed: 04/15/2020 6:49:12 PM By: Gretta Cool, BSN, RN, CWS, Kim RN, BSN Entered By: Gretta Cool, BSN, RN, CWS, Kim on 04/15/2020 15:21:15 Bradley [name] ([id number]) -------------------------------------------------------------------------------- Wound Assessment Details Patient Name: Bradley [name], Bradley A. Date of Service: 04/15/2020 2:15 PM Medical Record Number: [id number] Patient Account Number: [id number] Date of Birth/Sex: Nov 27, 1945 (74 y.o. M) Treating RN: Cornell Barman Primary Care Khrystina Bonnes: [name] Other Clinician: Referring Rashid Whitenight: [name] Treating Annett Boxwell/Extender: Tito Dine in Treatment: 39 Wound Status Wound Number: 10 Primary Pressure Ulcer Etiology: Wound Location: Midline Sacrum Wound Open Wounding Event: Pressure Injury Status: Date Acquired: 06/07/2015 Comorbid Anemia, Hypertension, History of pressure wounds, Weeks Of Treatment: 39 History: Rheumatoid Arthritis, Paraplegia Clustered Wound: No Photos Photo Uploaded By: Gretta Cool, BSN, RN, CWS, Kim on 04/15/2020 19:11:35 Wound Measurements Length: (cm) 17 Width: (cm) 10 Depth: (cm) 8 Area: (cm) 133.518 Volume: (cm) 1068.142 % Reduction in Area: -159.1% % Reduction in Volume: -245.5% Epithelialization: None Tunneling: No Undermining: No Wound Description Classification: Category/Stage IV Wound Margin: Epibole Exudate Amount: Large Exudate Type: Serous Exudate Color: amber Foul Odor After  Cleansing:  No Slough/Fibrino Yes Wound Bed Granulation Amount: Medium (34-66%) Exposed Structure Granulation Quality: Pink Fascia Exposed: No Necrotic Amount: Medium (34-66%) Fat Layer (Subcutaneous Tissue) Exposed: Yes Necrotic Quality: Adherent Slough Tendon Exposed: No Muscle Exposed: Yes Necrosis of Muscle: No Joint Exposed: No Bone Exposed: Yes Treatment Notes Wound #10 (Midline Sacrum) Notes Dakin's soaked gauze, ABD pads, secured with tape Bradley [name], Bradley [name] ([id number]) Electronic Signature(s) Signed: 04/15/2020 6:49:12 PM By: Gretta Cool, BSN, RN, CWS, Kim RN, BSN Entered By: Gretta Cool, BSN, RN, CWS, Kim on 04/15/2020 15:11:33 Bradley [name] ([id number]) -------------------------------------------------------------------------------- Wound Assessment Details Patient Name: Bradley Loa A. Date of Service: 04/15/2020 2:15 PM Medical Record Number: [id number] Patient Account Number: [id number] Date of Birth/Sex: Jan 20, 1946 (74 y.o. M) Treating RN: Cornell Barman Primary Care Trevionne Advani: [name] Other Clinician: Referring Marlenne Ridge: [name] Treating Keonia Pasko/Extender: Tito Dine in Treatment: 39 Wound Status Wound Number: 12 Primary Pressure Ulcer Etiology: Wound Location: Left, Proximal Upper Leg Wound Open Wounding Event: Gradually Appeared Status: Date Acquired: 03/06/2020 Comorbid Anemia, Hypertension, History of pressure wounds, Weeks Of Treatment: 0 History: Rheumatoid Arthritis, Paraplegia Clustered Wound: No Photos Photo Uploaded By: Gretta Cool, BSN, RN, CWS, Kim on 04/15/2020 19:11:35 Wound Measurements Length: (cm) 5 Width: (cm) 4 Depth: (cm) 0.1 Area: (cm) 15.708 Volume: (cm) 1.571 % Reduction in Area: 0% % Reduction in Volume: 0% Epithelialization: Small (1-33%) Tunneling: No Undermining: No Wound Description Classification: Unstageable/Unclassified Wound Margin: Flat and Intact Exudate Amount: Large Exudate Type: Serous Exudate Color:  amber Foul Odor After Cleansing: No Slough/Fibrino Yes Wound Bed Granulation Amount: None Present (0%) Exposed Structure Necrotic Amount: Large (67-100%) Fascia Exposed: No Necrotic Quality: Adherent Slough Fat Layer (Subcutaneous Tissue) Exposed: Yes Tendon Exposed: No Muscle Exposed: No Joint Exposed: No Bone Exposed: No Treatment Notes Wound #12 (Left, Proximal Upper Leg) Notes Dakin's soaked gauze, ABD pads, secured with tape Electronic Signature(s) Bradley [name], Bradley [name] ([id number]) Signed: 04/15/2020 6:49:12 PM By: Gretta Cool, BSN, RN, CWS, Kim RN, BSN Entered By: Gretta Cool, BSN, RN, CWS, Kim on 04/15/2020 15:23:32 Bradley [name] ([id number]) -------------------------------------------------------------------------------- Vitals Details Patient Name: Bradley Loa A. Date of Service: 04/15/2020 2:15 PM Medical Record Number: [id number] Patient Account Number: [id number] Date of Birth/Sex: 12-Apr-1946 (74 y.o. M) Treating RN: Dolan Amen Primary Care Caelum Federici: [name] Other Clinician: Referring Tonita Bills: [name] Treating Ernan Runkles/Extender: Tito Dine in Treatment: 39 Vital Signs Time Taken: 14:22 Temperature (F): 98.3 Pulse (bpm): 96 Respiratory Rate (breaths/min): 18 Blood Pressure (mmHg): 107/69 Reference Range: 80 - 120 mg / dl Electronic Signature(s) Signed: 04/15/2020 4:38:43 PM By: Georges Mouse, Minus Breeding Entered By: Georges Mouse, Minus Breeding on 04/15/2020 14:26:18

## 2020-04-16 NOTE — Progress Notes (Signed)
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 is going to need this done again 05/24/16; the patient returns today for review of his stage III sacral pressure ulcer. We have been using Aquacel Ag he tells me that he changes this up to twice [name] day. I'm not really certain of the reason for this frequency of changing. He has some involvement from the home health nurses but I think is doing most of the changing himself which I think because of his paraplegia would be [name] very difficult exercise. Nevertheless he states that there is "wetness". I am not sure if there is another dressing that we could easily changed that much. I'd wanted to change to [facility] but I'll need to have [name] sense of how frequent he would need to change this. 06/14/16; this is [name] patient returns for review of his stage III sacral pressure ulcer. We have been using Aquacel Ag and over the last 2 visits he has had extensive debridement so of the thick circumferential skin and subcutaneous tissue that surrounds this wound. In spite of this really absolutely no change in the condition of the wound warrants measurements. We have [facility] home [facility] I believe changing the dressing on 3 occasions the patient states he does this on one occasion himself 06/28/16; this is [name] patient who has [name] fairly large stage III sacral pressure ulcer. I changed him to [facility] from Aquacel 2 weeks ago. He Bradley [name], Bradley [name] ([id number]) returns today in follow-up. In the meantime [name] nurse from [facility] has calledrequesting ordering of [name] wound VAC. He had this discussion before. The problem is the proximity of the lowest edge of this wound to the patient's anal opening roughly  3/4 of an inch. Can't see how this can be arranged. Apparently the nurse who is calling has [name] lot of experience, the question would be then when she is not available would be doing this. I would not have thought that this wound is not amenable to [name] wound VAC because of this reason 07/12/16; the patient comes in today and I have signed orders for [name] wound VAC. The home health team through [facility] is convinced that he can benefit from this even though there is close proximity to his anal opening beneath the gluteal clefts. The patient does not have [name] bowel regimen but states he has [name] bowel movement every 2 days this will also provide some problem with regards to the vac seal 07/26/16; the patient never did obtain [name]  Medellin wound VAC as he could not afford the $200 per month co-pay we have been using Hydrofera Blue now for 6 weeks or so. No major change in this wound at all. He is still not interested in the concept of plastic surgery. There changing the dressing every second day 08/09/16; the patient arrives with [name] wound precisely in the same situation. In keeping with the plan I outlined last time extensive debridement with an open curet the surface of this is not completely viable. Still has some degree of surrounding thick skin and subcutaneous tissue. No evidence of infection. Once again I have had [name] conversation with him about plastic surgery, he is simply not interested. 08/23/16; wound is really no different. Thick circumferential skin and subcutaneous tissue around the wound edge which is [name] lot better from debridement we did earlier in the year. The surface of the wound looks viable however with [name] curet there is definitely [name] gritty surface to this. We use Medihoney for [name] while, he could not afford Santyl. I don't think we could get [name] supply of Iodoflex. He talks [name] little more positively about the concept of plastic surgery which I've gone over with him today 08/31/16;; patient arrives in clinic today  with the wound surface really no different there is no changes in dimensions. I debrided today surface on the left upper side of this wound aggressively week ago there is no real change here no evidence of epithelialization. The problem with debridement in the clinic is that he believes from this very liberally. We have been using Sorbact. 09/21/16; absolutely no change in the appearance or measurements of this wound. More recently I've been debrided in this aggressively and using sorbact to see if we could get to [name] better wound surface. Although this visually looks satisfactory, debridement reveals [name] very gritty surface to this. However even with this debridement and removal of thick nonviable skin and subcutaneous tissue from around the large amount of the circumference of this wound we have made absolutely no progress. This may be an offloading issue I'm just not completely certain. It has 2 close proximity in its inferior aspect to consider negative pressure therapy 10/26/16; READMISSION This patient called our clinic yesterday to report an odor in his wound. He had been to see plastic surgery at [facility] at our request after his last visit on 09/21/16; we have been seeing him for several months with [name] large stage III wound. He had been sent to general surgery for consideration of [name] colostomy, that appointment was not until mid June He comes in today with [name] temperature of 101. He is reporting an odor in the wound since last weekend. 01/10/17 Readmission: 01/10/17 On evaluation today it is noted that patient has been seen by plastic surgery at [facility] since he was last evaluated here. They did discuss with him the possibility of [name] flap according to the notes but unfortunately at this point he was not quite ready to proceed with surgery and instead wanted to give the Wound VAC [name] try. In the hospital they were able to get [name] good seal on the Wound VAC. Unfortunately since that time they have been having trouble in  regard to his current home health company keeping [name] simple on the Wound VAC. He would like to switch to [name] different home health company. With that being said it sounds as if the problem is that his wound VAC is not feeling at the lower portion of his back and  he tells me that he can take some of the clear plastic and put over that area when the sill breaks and it will correct it for time. He has no discomfort or pain which is good news. He has been treated with IV vancomycin since he was last seen here and has an appointment with [name] infectious disease specialist in two days on 01/12/17. Otherwise he was transferred back to [place] for continuing to monitor and manage is wound as she progresses with [name] Wound VAC for the time being. 01/17/17 on evaluation today patient continues to show evidence of slight improvement with the Wound VAC fortunately there's no evidence of infection or otherwise worsening condition in general. Nonetheless we were unable to get him switch to [facility] in regard to home help from his current company. I'm not sure the reasoning behind but for some reason he was not accepted as [name] patient with him. Continue to apply the Wound VAC which does still show that some maceration around the wound edges but the wound measurements were slightly improved. No fevers, chills, nausea, or vomiting noted at this time. 02/14/17; this patient I have not seen in 5 months although he has been readmitted to our clinic seen by our physician assistant Jeri Cos twice in early August. I have looked through [facility] notes care everywhere. The patient saw plastic surgery in May [Dr. Bhatt}. The patient was sent to general surgery and ultimately had [name] colostomy placed. On 11/29/16. This was after he was admitted to [facility] sometime in May. An MRI of the pelvis on 5/23 showed osteomyelitis of the coccyx. An attempt was made to drain fluid that was not successful. He was treated with empiric broad-spectrum antibiotics  VAC/cefepime/Flagyl starting on 11/02/16 with plans for [name] 6 week course. According to their notes he was sent to [name] nursing home. Was last seen by Dr. Myriam Jacobson of plastic surgery on 12/28/16. The first part of the note is [name] long dissertation about the difficulties finding adequate patients for flap closure of pressure ulcers. At that time the wound was noted to be stage IV based I think on underlying infection no exposed bone and healthy granulation tissue. 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 He was last seen by infectious disease 01/12/17 [name] Dr. Uvaldo Rising. His note says that Mr. Hippert was not interested in [name] flap closure for referring [name] trial of the wound VAC. As previously anticipated the wound VAC could not be maintained as an outpatient in the community. He is now using something similar to [name] Dakin's wet to dry recommended by [facility] VASHE solution. He is placing this twice [name] day himself. This is almost s hopeless setting in terms of heeling 02/28/17; he is using [name] Dakin's wet to dry. Most of the wound surface looks satisfactory however the deeper area over his coccyx now has exposed bone I'm not sure if I noted this last week. 03/21/17; patient is usingVASHE solution wet to dry which I gather is [name] variation on Dakin solution. He has home health changing this 3 times [name] week the other days he does this himself. His appointment with plastic surgery 04/18/17; patient continues to use [name] variant of Dakin solution I believe. His wound continues to have [name] clean viable surface. The 2 areas of exposed bone in the center of this wound had closed over. He has an appointment with plastic surgery on December 5 at which time I hope that there'll be  [name] plan for myocutaneous flap closure In looking through [place] link I couldn't find any more plastic surgery appointments. I did come across the fact that he is been followed by hematology for [name] microcytic hypochromic anemia.  He had [name] reasonably normal looking hemoglobin electrophoresis. His iron level was 10 and according to the patient he is going for IV iron infusions starting tomorrow. He had [name] sedimentation rate of 74. More problematically from [name] pure wound care point of view his albumin was 2.7 earlier this month 05/17/17; this is [name] patient I follow monthly. He has [name] large now stage IV wound over his bilateral buttocks with close proximity to his anal Bradley [name], Bradley [name]. ([id number]) opening. More recently he has developed [name] large area with exposed bone in the center of this probably secondary to the underlying osteomyelitis E had in the summer. He also follows with Dr. Myriam Jacobson at [name] who is plastic surgery. He had an appointment earlier this month and according to the patient Dr. Myriam Jacobson does not want to proceed with any attempted flap closure. Although I do not have current access to her note in care everywhere this is likely due to exposed bone. Again according to the patient they did [name] bone biopsy. He is still using [name] variant of Dakin solution changing twice [name] day. He has home health. The patient is not able to give me [name] firm answer about how long he spends on this in his wheelchair The patient also states that Dr. Myriam Jacobson wanted to reconsider [name] wound VAC. I really don't see this as [name] viable option at least not in the outpatient setting. The wound itself is frankly to close to his anal opening to maintain [name] seal. The last time we tried to do this home health was unable to manage it. It might be possible to maintain [name] wound VAC in this setting outside of the home such as [name] skilled nursing facility or an [place] however I am doubtful about this even in that setting **** READMISSION 09/21/17-He is here for evaluation of stage IV sacral ulcer. Since his last evaluation here in December he has completed treatment for sacral osteomyelitis. He was at [place] for IV therapy and NPWT dressing changes. He was  discharged, with home health services, in February. He admits that while in the skilled facility he had "80%" success with maintaining dressing, since discharge he has had approximately "40%" success with maintaining wound VAC dressing. We discussed at length that this is not [name] safe or recommended option. We will apply Dakin's wet to dry dressing daily and he will follow-up next week. He is accompanied today by his sister who is willing to assist in dressing changes; they will discuss the social issue as he feels he is capable of changing dressing daily when home health is not able. 09/28/17-He is here in follow-up evaluation for stage IV sacral pressure ulcer. He has been using the Dakin's wet-to-dry daily; he continues with home health. He is not accompanied by anyone at this visit. He will follow up in two weeks per his request/preference. 10/12/17 on evaluation today patient appears to be doing very well. The Dakin solution went to dry packings do seem to be helping him as far as the sacral wound is concerned I'm not seeing anything that has me more concerned as far as infection or otherwise is concerned. Overall I'm pleased with the appearance of the wound. 10/26/17-He is here in follow up evaluation for [name] stage IV  sacral ulcer. He continues with daily Dakin's wet-to-dry. He is voicing no complaints or concerns. He will follow-up in 2 weeks 11/16/17-He is here for follow up evaluation for [name] palliative stage IV sacral pressure ulcer. We will continue with Dakin's wet-to-dry. He will follow- up in 4 weeks. He is expressing concern/complaint regarding new bed that has arrived, stating he is unable to manipulate/maneuver it due to the bed crank being at the foot of the bed. 12/14/17-He is here for evaluation for palliative stage IV sacral ulcer. He is voicing no complaints or concerns. We will continue with one-to-one ratio of saline and dakins. He will follow-up in 3 weeks 01/04/18-He is here in follow-up  evaluation for palliative stage IV sacral ulcer. He is inquiring about reinstating the negative pressure wound therapy. We discussed at length that the negative pressure wound therapy in the home setting has not been successful for him repeatedly with loss of cereal and unavailability of 24/7 help; reminded him that home health is not available 24/7 when loss of seal occurs. He does verbalize understanding to this and does not pursue. We also discussed the palliative nature of this ulcer (given no significant change/improvement in measurement/appearance, not [name] candidate for muscle flap per plastic surgery, and continued independent living) and that the goal is for maintenance, decrease in infection and minimizing/avoiding deterioration given that he is independent in his care, does not have home health and requires daily dressing changes secondary to drainage amount. He is inquiring about [name] wound clinic in [facility], I have informed him that I am unfamiliar with that clinic but that he is encouraged to seek another opinion if that is his desire. We will continue with dakins and he will follow up in three weeks 01/25/18-He is here in follow-up evaluation for palliative stage IV sacral ulcer. He continues with Dakin's/saline 1:1 mixture wet to dry dressing changes. He states he has an appointment at [facility] on 9/17 for evaluation of surgical intervention/closure of the sacral ulcer. He will follow-up here in 4 weeks Readmission: 07/16/2019 upon evaluation today patient appears to be doing really about the same as when he was previously seen here in the wound care center. He most recently was [name] patient of [facility] back when she was still working here in the center but had been referred to [facility] for consideration of [name] flap. With that being said the surgeon there at [facility] stated that this was too large for her flap and they have been attempting to get this smaller in order to be able to proceed with [name] flap. Nonetheless  unfortunately he has had [name] cycle of going back and forth between the osteomyelitis flaring and then sent him back and then making [name] little bit of progress only to be sent back again. It sounds like most recently they have been using [name] Iodoflex type dressing at this point which does not seem to have done any harm by any means. With that being said this wound seems to be quite large for using Iodoflex throughout and subsequently I think he may do much better with the use of Vioxx moistened gauze which would be safe for the new tissue growing and also keep the wound quite nice and clean. The patient is not opposed to this he in fact states that his home health nurse had mentioned this as [name] possibility as well. 07/23/2019 upon evaluation today patient actually appears to be doing very well in regard to his sacral wound. In fact this is much  healthier the measurements not terribly different but again with [name] wound like this at home necessarily expect [name] [name] lot of change as far as the overall measurements are concerned in just 1 week's time. This is going be [name] much longer term process at this point. With that being said I do think that he is very healthy appearing as far as the base of the wound is concerned. 08/06/2019 upon evaluation today patient actually appears to be doing about the same with regard to his wound to be honest. There is really not [name] significant improvement overall based on what I am seeing today. Fortunately there is no signs of significant systemic infection. No fevers, chills, nausea, vomiting, or diarrhea. With that being said there is odor to the drainage from the wound and subsequently also what appears to be increased drainage based on what we are seeing today as well as what his home health nurse called [place] about that she was concerned with as well. 08/13/2019 upon evaluation today patient appears to be doing about the same at this point with regard to his wound although I think the  dressing may be [name] little less drainage wise compared to what it was previous. Fortunately there is no signs of active infection at this time. No fevers, chills, nausea, vomiting, or diarrhea. He has been taking the antibiotic for only 3 days. 08/23/2019 upon evaluation today patient appears to be doing not nearly as well with regard to his wound. In general really not making [name] lot of progress which is unfortunate. There does not appear to be any signs of active infection at this time. No fevers, chills, nausea, vomiting, or diarrhea. With that being said the patient unfortunately does seem to be experiencing continued epiboly around the edges of the wound as well as significant scar tissue he has been in the majority of his day sitting in his chair which is likely [name] big reason for all of this. Fortunately there is no signs of active infection at this time. No fevers, chills, nausea, vomiting, or diarrhea. 08/29/2019 upon evaluation today patient's wound actually appears to be showing some signs of improvement. The region of few areas of new skin growth around the edges of the wound even where he has some of the epiboly. This is actually good news and I think that he has been very aggressive and offloading over the past week since we showed him the pictures of his wound last week. There still may be [name] chance that he is Bradley [name], MAGGIO. ([id number]) going require some sharp debridement to clear away some of these edges but to be honest I think that is can be quite an undertaking. The main reason is that he has [name] lot of thickened scar tissue and it is good to bleed quite significantly in my opinion. The I think if organ to do that we may even need to have [name] portable or disposable cautery in order to make sure that we are able to get the area completely sealed up as far as bleeding is concerned. I do have one that we can utilize. However being that the wound looks so much better right now would like to give  this 2 weeks to see how things stand and look at that point before making any additional recommendations. 09/12/2019 upon evaluation today patient appears to be making some progress as far as offloading is concerned. He still has [name] substantial wound but nonetheless he tells me he is off loading  much more aggressively at this point. This is obviously good news. No fevers, chills, nausea, vomiting, or diarrhea. 4/22; I have not seen this patient in quite some time. No major change in the condition of the wound or its wound volume. Surrounding maceration of the skin around the edges of the wound. The wound is fairly substantial. To close to the anal opening to consider [name] wound VAC. He had extensive trials of plastic surgery at [facility] which really does not result in any improvement. His wound bed is however clean 10/10/2019 upon evaluation today patient appears to be doing well with regard to the wound in the sacral region. He has been tolerating the dressing changes without complication. Fortunately there is no signs of infection and he actually has some epithelial growth noted as well. He tells me has been trying to continue to offload this area which is excellent that is what he needs to do most as far as what he can have in his control. Otherwise I feel like the collagen with [name] saline moistened gauze behind has been beneficial for him. 5/20; collagen and normal saline wet-to-dry. Although the tissue when this large sacral wound looks reasonable there is been absolutely no benefit to the overall closure. He has macerated skin around this. He tells me that he spends [name] large part of the day up in the wheelchair. He still has home health 11/07/2019 upon evaluation today patient appears to be doing well with regard to his wounds at this point. Fortunately there is no signs of active infection at this time. Overall I feel like his sacral wound in general appears to be doing quite nicely. I am very  pleased with overall how things are progressing and I feel like the patient is making excellent progress towards resolution. With that being said he still has [name] long ways to go before we expect to see this completely healed. 11/21/2019 upon evaluation today patient appears to be doing about the same in regard to his sacral wound. Unfortunately he is really not making much in the way of improvement when I questioned him about how much time he is really spending sitting on his bottom he tells me that the majority of his day is mainly in that position though he does spend some time in bed. It does not sound like it is enough however neither does it look like it is enough. 12/24/2019 upon evaluation today patient appears to be doing really about the same there is no significant improvement overall in his wound bed. Last time he was seen here we did have [name] discussion about the possibility of seeing if I can get [name] surgeon to evaluate and potentially clear away some of the thickened edges of the wound in order to allow this wound to potentially have [name] chance of closing with [name] wound VAC. Right now we could not even get [name] wound VAC to seal with some of the rolled and thickened edges. With that being said elected to the patient to think about this to discuss today. At this point the patient does want to see if I could make the referral to potentially see if the surgeon would be able to perform this and subsequently he agrees to go to [name] following if they are able to do this in order to allow for [name] wound VAC and to get this wound to heal more effectively and quickly. In my opinion the only way that I would recommend  that he see the surgeon to clear away the edges would be if he is also in agreement with going to skilled nursing facility as I know he cannot appropriately offload at home to take care of this and that setting. 01/07/2020 on evaluation today patient appears to be doing about the  same in regard to his wound. There is no signs of active infection at this time which is great news. All in all he is really doing about the same. We have made referral to surgery to see if they can potentially perform debridement to clear away some of the scarred edging of the wound so we could potentially see about [name] wound VAC to try to help this heal more efficiently. Again we have discussed that if this occurs he would be in [name] facility following or release would need to be in order to appropriately offload. Nonetheless we are still in the process of working that referral. 01/21/2020 upon evaluation today patient appears to be doing about the same in regard to his sacral wound. He did see Dr. Lysle Pearl Dr. Ines Bloomer feeling was that there was not anything surgically he could offer at this point as he did not feel like that [name] wound VAC would likely be appropriate for the patient. With that being said he did recommend potential for trying to get the patient into [name] facility as an outpatient and they will get me working on that as well. Fortunately there is no signs of active infection at this time. 11/10; this is [name] patient that has not been seen here in quite [name] bit of time while we were in our alternative venue. As well I have not seen this patient in probably over 2 years. As I remember things with him he has had this pressure ulcer encompassing his lower sacrum and coccyx and the surrounding buttocks for [name] prolonged period of time. When he first came here the surface looked fairly good and the wound was smaller we attempted [name] wound VAC but we could never keep this in place. Because of nonresponse of the wound to topical dressings we referred him to [facility] plastic surgery where preparation was being made for flap closure. Intact he had [name] colostomy however after that he was deemed to have 2 large wound for flap closure. He did have osteomyelitis in the coccyx area in  2019. He is using wet-to-dry he has home health. They are managing his ostomy supplies as well. The wound is substantially larger than what I remember. Versus last time he was here to wound months or so ago he has an unstageable wound on the left upper thigh posteriorly. He tells me he is in his chair quite [name] bit especially recently Objective Constitutional Sitting or standing Blood Pressure is within target range for patient.. Pulse regular and within target range for patient.Marland Kitchen Respirations regular, non- labored and within target range.. Temperature is normal and within the target range for the patient.Marland Kitchen appears in no distress. Vitals Time Taken: 2:22 PM, Temperature: 98.3 F, Pulse: 96 bpm, Respiratory Rate: 18 breaths/min, Blood Pressure: 107/69 mmHg. Bradley [name], Bradley [name] ([id number]) General Notes: Wound exam; the patient's wound is very large. In the midpoint on the left and right there are probing areas that go to bone especially on the left I suspect this is his ischial tuberosity. There is [name] large amount of drainage when he came in but I did not see any gross purulence. No surrounding infection. He  has an unstageable area on the left upper thigh Integumentary (Hair, Skin) Wound #10 status is Open. Original cause of wound was Pressure Injury. The wound is located on the Midline Sacrum. The wound measures 17cm length x 10cm width x 8cm depth; 133.518cm^2 area and 1068.142cm^3 volume. There is bone, muscle, and Fat Layer (Subcutaneous Tissue) exposed. There is no tunneling or undermining noted. There is [name] large amount of serous drainage noted. The wound margin is epibole. There is medium (34-66%) pink granulation within the wound bed. There is [name] medium (34-66%) amount of necrotic tissue within the wound bed including Adherent Slough. Wound #12 status is Open. Original cause of wound was Gradually Appeared. The wound is located on the Left,Proximal Upper Leg. The wound measures 5cm length x 4cm  width x 0.1cm depth; 15.708cm^2 area and 1.571cm^3 volume. There is Fat Layer (Subcutaneous Tissue) exposed. There is no tunneling or undermining noted. There is [name] large amount of serous drainage noted. The wound margin is flat and intact. There is no granulation within the wound bed. There is [name] large (67-100%) amount of necrotic tissue within the wound bed including Adherent Slough. 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 Wound #12 Left,Proximal Upper Leg: Change dressing every day. Follow-up Appointments: Wound #10 Midline Sacrum: Return Appointment in 2 weeks. Wound #12 Left,Proximal Upper Leg: Return Appointment in 2 weeks. Off-Loading: Wound #10 Midline Sacrum: Turn and reposition every 2 hours Other: - Keep pressure off of wounded area. Wound #12 Left,Proximal Upper Leg: Turn and reposition every 2 hours Other: - Keep pressure off of wounded area. Additional Orders / Instructions: Wound #10 Midline Sacrum: Increase protein intake. Wound #12 Left,Proximal Upper Leg: Increase protein intake. Home Health: Wound #10 Midline Sacrum: Continue Home Health Visits HARBERT, FITTERER ([id number]) Home Health Nurse may visit PRN to address patient s wound care needs. FACE TO FACE ENCOUNTER: MEDICARE and MEDICAID PATIENTS: I certify that this patient is under my care and that  I had [name] face-to-face encounter that meets the physician face-to-face encounter requirements with this patient on this date. The encounter with the patient was in whole or in part for the following MEDICAL CONDITION: (primary reason for [place]) MEDICAL NECESSITY: I certify, that based on my findings, NURSING services are [name] medically necessary home health service. HOME BOUND STATUS: I certify that my clinical findings support that this patient is homebound (i.e., Due to illness or injury, pt requires aid of supportive devices such as crutches, cane, wheelchairs, walkers, the use of special transportation or the assistance of another person to leave their place of residence. There is [name] normal inability to leave the home and doing so requires considerable and taxing effort. Other absences are for medical reasons / religious services and are infrequent or of short duration when for other reasons). If current dressing causes regression in wound condition, may D/C ordered dressing product/s and apply Normal Saline Moist Dressing daily until next [place] / Other MD appointment. Notify  [facility] of regression in wound condition at [phone]. Please direct any NON-WOUND related issues/requests for orders to patient's Primary Care Physician Wound #12 Left,Proximal Upper Leg: [place] Nurse may visit PRN to address patient s wound care needs. FACE TO FACE ENCOUNTER: MEDICARE and MEDICAID PATIENTS: I certify that this patient is under my care and that I had [name] face-to-face encounter that meets the physician face-to-face encounter requirements with this patient on this date. The encounter with the patient was in whole or in part for the following MEDICAL CONDITION: (primary reason for [place]) MEDICAL NECESSITY: I certify, that based on my findings, NURSING services are [name] medically necessary home health service. HOME BOUND STATUS: I certify  that my clinical findings support that this patient is homebound (i.e., Due to illness or injury, pt requires aid of supportive devices such as crutches, cane, wheelchairs, walkers, the use of special transportation or the assistance of another person to leave their place of residence. There is [name] normal inability to leave the home and doing so requires considerable and taxing effort. Other absences are for medical reasons / religious services and are infrequent or of short duration when for other reasons). If current dressing causes regression in wound condition, may D/C ordered dressing product/s and apply Normal Saline Moist Dressing daily until next [place] / Other MD appointment. [place] of regression in wound condition at [phone]. Please direct any NON-WOUND related issues/requests for orders to patient's Primary Care Physician Radiology ordered were: X-ray, other - Pelvis Laboratory ordered were: CBC W Auto Differential panel ordered were: CMP, Sed rate, C-reactive protein 1. The only option currently is some form of wet to dry dressing currently they are using saline 2. At this point in time I thought it would be reasonable to stage where we are with the underlying bone damage. Towards this end I have ordered an x-ray of the pelvis lab work including [name] comprehensive metabolic panel CBC with differential sedimentation rate and C-reactive protein. [name] CT scan of the pelvis might also be necessary. 3. If he does have worsening osteomyelitis it might qualify him for another course of IV antibiotics which would allow [place] to get him in hospital and then into long-term care as long as we had [name] 3-day qualifying stay at hospital. The patient seemed interested in this. I am not sure if this will actually come to fruition however. 4. I told him that unless there is 24/7 religious offloading of this area there is absolutely no way any of this is going to heal  assuming of course there is not underlying active osteomyelitis. 5. The way this wound is looking now there is no possibility of [name] wound VAC and I do not think there is any possibility of flap surgery 6. It would be possible to get [name] specimen of bone here. The presumably ischial tuberosity on the left is easily palpable through [name] sinus tract We will see him back in 3 weeks. He will see if he can get the x-ray done before this. Lab work can be done by Teaching laboratory technician) Signed: 04/16/2020 12:18:17 PM By: Linton Ham MD Entered By: Linton Ham on 04/15/2020 15:37:30 [name] ([id number]) -------------------------------------------------------------------------------- [place] Details Patient Name: Bradley Loa [name]. Date of Service: 04/15/2020 Medical Record Number: [id number] Patient Account Number: [id number] Date of Birth/Sex: Nov 14, 1945 (74 y.o. 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 Facility Procedures CPT4 Code: 34196222 Description: 99214 - WOUND CARE VISIT-LEV 4 EST PT Modifier: Quantity: 1 Physician Procedures CPT4 Code: 9798921 Description: 19417 - WC PHYS LEVEL 4 - EST PT Modifier: Quantity: 1 CPT4 Code: Description: ICD-10 Diagnosis Description L89.154 Pressure ulcer of sacral region, stage 4 L97.128 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

## 2020-04-17 ENCOUNTER — Inpatient Hospital Stay: Payer: Medicare HMO

## 2020-04-17 VITALS — BP 121/56 | HR 90 | Temp 98.2°F | Resp 18

## 2020-04-17 DIAGNOSIS — J309 Allergic rhinitis, unspecified: Secondary | ICD-10-CM | POA: Diagnosis not present

## 2020-04-17 DIAGNOSIS — N400 Enlarged prostate without lower urinary tract symptoms: Secondary | ICD-10-CM | POA: Diagnosis not present

## 2020-04-17 DIAGNOSIS — I10 Essential (primary) hypertension: Secondary | ICD-10-CM | POA: Diagnosis not present

## 2020-04-17 DIAGNOSIS — D508 Other iron deficiency anemias: Secondary | ICD-10-CM

## 2020-04-17 DIAGNOSIS — L89154 Pressure ulcer of sacral region, stage 4: Secondary | ICD-10-CM | POA: Diagnosis not present

## 2020-04-17 DIAGNOSIS — N319 Neuromuscular dysfunction of bladder, unspecified: Secondary | ICD-10-CM | POA: Diagnosis not present

## 2020-04-17 DIAGNOSIS — G8220 Paraplegia, unspecified: Secondary | ICD-10-CM | POA: Diagnosis not present

## 2020-04-17 DIAGNOSIS — S24104S Unspecified injury at T11-T12 level of thoracic spinal cord, sequela: Secondary | ICD-10-CM | POA: Diagnosis not present

## 2020-04-17 DIAGNOSIS — Z79899 Other long term (current) drug therapy: Secondary | ICD-10-CM | POA: Diagnosis not present

## 2020-04-17 DIAGNOSIS — D509 Iron deficiency anemia, unspecified: Secondary | ICD-10-CM | POA: Diagnosis not present

## 2020-04-17 MED ORDER — IRON SUCROSE 20 MG/ML IV SOLN
200.0000 mg | Freq: Once | INTRAVENOUS | Status: AC
  Administered 2020-04-17: 200 mg via INTRAVENOUS
  Filled 2020-04-17: qty 10

## 2020-04-17 MED ORDER — SODIUM CHLORIDE 0.9 % IV SOLN: 200.0000 mg | Freq: Once | INTRAVENOUS | Status: DC

## 2020-04-17 MED ORDER — SODIUM CHLORIDE 0.9 % IV SOLN
Freq: Once | INTRAVENOUS | Status: AC
  Filled 2020-04-17: qty 250

## 2020-04-17 NOTE — Progress Notes (Signed)
Patient received prescribed treatment today in clinic; tolerated well. Discharged to home in stable condition.

## 2020-04-20 DIAGNOSIS — Z79899 Other long term (current) drug therapy: Secondary | ICD-10-CM | POA: Diagnosis not present

## 2020-04-20 DIAGNOSIS — I10 Essential (primary) hypertension: Secondary | ICD-10-CM | POA: Diagnosis not present

## 2020-04-20 DIAGNOSIS — N319 Neuromuscular dysfunction of bladder, unspecified: Secondary | ICD-10-CM | POA: Diagnosis not present

## 2020-04-20 DIAGNOSIS — S24104S Unspecified injury at T11-T12 level of thoracic spinal cord, sequela: Secondary | ICD-10-CM | POA: Diagnosis not present

## 2020-04-20 DIAGNOSIS — G8220 Paraplegia, unspecified: Secondary | ICD-10-CM | POA: Diagnosis not present

## 2020-04-20 DIAGNOSIS — L89154 Pressure ulcer of sacral region, stage 4: Secondary | ICD-10-CM | POA: Diagnosis not present

## 2020-04-20 DIAGNOSIS — N400 Enlarged prostate without lower urinary tract symptoms: Secondary | ICD-10-CM | POA: Diagnosis not present

## 2020-04-20 DIAGNOSIS — D509 Iron deficiency anemia, unspecified: Secondary | ICD-10-CM | POA: Diagnosis not present

## 2020-04-20 DIAGNOSIS — J309 Allergic rhinitis, unspecified: Secondary | ICD-10-CM | POA: Diagnosis not present

## 2020-04-22 DIAGNOSIS — N319 Neuromuscular dysfunction of bladder, unspecified: Secondary | ICD-10-CM | POA: Diagnosis not present

## 2020-04-22 DIAGNOSIS — G8220 Paraplegia, unspecified: Secondary | ICD-10-CM | POA: Diagnosis not present

## 2020-04-22 DIAGNOSIS — L89154 Pressure ulcer of sacral region, stage 4: Secondary | ICD-10-CM | POA: Diagnosis not present

## 2020-04-22 DIAGNOSIS — N400 Enlarged prostate without lower urinary tract symptoms: Secondary | ICD-10-CM | POA: Diagnosis not present

## 2020-04-22 DIAGNOSIS — I10 Essential (primary) hypertension: Secondary | ICD-10-CM | POA: Diagnosis not present

## 2020-04-22 DIAGNOSIS — J309 Allergic rhinitis, unspecified: Secondary | ICD-10-CM | POA: Diagnosis not present

## 2020-04-22 DIAGNOSIS — S24104S Unspecified injury at T11-T12 level of thoracic spinal cord, sequela: Secondary | ICD-10-CM | POA: Diagnosis not present

## 2020-04-22 DIAGNOSIS — Z79899 Other long term (current) drug therapy: Secondary | ICD-10-CM | POA: Diagnosis not present

## 2020-04-22 DIAGNOSIS — D509 Iron deficiency anemia, unspecified: Secondary | ICD-10-CM | POA: Diagnosis not present

## 2020-04-24 DIAGNOSIS — I10 Essential (primary) hypertension: Secondary | ICD-10-CM | POA: Diagnosis not present

## 2020-04-24 DIAGNOSIS — S24104S Unspecified injury at T11-T12 level of thoracic spinal cord, sequela: Secondary | ICD-10-CM | POA: Diagnosis not present

## 2020-04-24 DIAGNOSIS — G8220 Paraplegia, unspecified: Secondary | ICD-10-CM | POA: Diagnosis not present

## 2020-04-24 DIAGNOSIS — N400 Enlarged prostate without lower urinary tract symptoms: Secondary | ICD-10-CM | POA: Diagnosis not present

## 2020-04-24 DIAGNOSIS — D509 Iron deficiency anemia, unspecified: Secondary | ICD-10-CM | POA: Diagnosis not present

## 2020-04-24 DIAGNOSIS — J309 Allergic rhinitis, unspecified: Secondary | ICD-10-CM | POA: Diagnosis not present

## 2020-04-24 DIAGNOSIS — L89154 Pressure ulcer of sacral region, stage 4: Secondary | ICD-10-CM | POA: Diagnosis not present

## 2020-04-24 DIAGNOSIS — N319 Neuromuscular dysfunction of bladder, unspecified: Secondary | ICD-10-CM | POA: Diagnosis not present

## 2020-04-24 DIAGNOSIS — Z79899 Other long term (current) drug therapy: Secondary | ICD-10-CM | POA: Diagnosis not present

## 2020-04-27 DIAGNOSIS — N319 Neuromuscular dysfunction of bladder, unspecified: Secondary | ICD-10-CM | POA: Diagnosis not present

## 2020-04-27 DIAGNOSIS — S24104S Unspecified injury at T11-T12 level of thoracic spinal cord, sequela: Secondary | ICD-10-CM | POA: Diagnosis not present

## 2020-04-27 DIAGNOSIS — J309 Allergic rhinitis, unspecified: Secondary | ICD-10-CM | POA: Diagnosis not present

## 2020-04-27 DIAGNOSIS — N400 Enlarged prostate without lower urinary tract symptoms: Secondary | ICD-10-CM | POA: Diagnosis not present

## 2020-04-27 DIAGNOSIS — I10 Essential (primary) hypertension: Secondary | ICD-10-CM | POA: Diagnosis not present

## 2020-04-27 DIAGNOSIS — D509 Iron deficiency anemia, unspecified: Secondary | ICD-10-CM | POA: Diagnosis not present

## 2020-04-27 DIAGNOSIS — L89154 Pressure ulcer of sacral region, stage 4: Secondary | ICD-10-CM | POA: Diagnosis not present

## 2020-04-27 DIAGNOSIS — Z79899 Other long term (current) drug therapy: Secondary | ICD-10-CM | POA: Diagnosis not present

## 2020-04-27 DIAGNOSIS — G8220 Paraplegia, unspecified: Secondary | ICD-10-CM | POA: Diagnosis not present
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
## 2020-04-28 ENCOUNTER — Ambulatory Visit (INDEPENDENT_AMBULATORY_CARE_PROVIDER_SITE_OTHER): Payer: Medicare HMO | Admitting: Urology

## 2020-04-28 ENCOUNTER — Other Ambulatory Visit: Payer: Self-pay

## 2020-04-28 ENCOUNTER — Encounter: Payer: Self-pay | Admitting: Urology

## 2020-04-28 ENCOUNTER — Telehealth: Payer: Self-pay | Admitting: Urology

## 2020-04-28 VITALS — BP 122/70 | HR 99

## 2020-04-28 DIAGNOSIS — N476 Balanoposthitis: Secondary | ICD-10-CM | POA: Diagnosis not present

## 2020-04-28 DIAGNOSIS — R310 Gross hematuria: Secondary | ICD-10-CM

## 2020-04-28 DIAGNOSIS — E349 Endocrine disorder, unspecified: Secondary | ICD-10-CM

## 2020-04-28 DIAGNOSIS — N390 Urinary tract infection, site not specified: Secondary | ICD-10-CM

## 2020-04-28 MED ORDER — NYSTATIN 100000 UNIT/GM EX CREA: 1.0000 "application " | TOPICAL_CREAM | Freq: Two times a day (BID) | CUTANEOUS | 0 refills | Status: DC

## 2020-04-28 MED ORDER — TESTOSTERONE 20.25 MG/1.25GM (1.62%) TD GEL: 2.0000 | Freq: Every day | TRANSDERMAL | 3 refills | Status: DC

## 2020-04-28 NOTE — Telephone Encounter (Signed)
Corrected fax number correction: fax# [phone]

## 2020-04-28 NOTE — Telephone Encounter (Signed)
Bradley [name] is going to need to start cathing 4 times daily.  He is currently receiving only 1 catheter a day through his insurance company.  We need to submit a prescription that reflects the need for cathing 4 times daily to [name] [phone]

## 2020-04-29 DIAGNOSIS — N400 Enlarged prostate without lower urinary tract symptoms: Secondary | ICD-10-CM | POA: Diagnosis not present

## 2020-04-29 DIAGNOSIS — N319 Neuromuscular dysfunction of bladder, unspecified: Secondary | ICD-10-CM | POA: Diagnosis not present

## 2020-04-29 DIAGNOSIS — S24104S Unspecified injury at T11-T12 level of thoracic spinal cord, sequela: Secondary | ICD-10-CM | POA: Diagnosis not present

## 2020-04-29 DIAGNOSIS — Z79899 Other long term (current) drug therapy: Secondary | ICD-10-CM | POA: Diagnosis not present

## 2020-04-29 DIAGNOSIS — J309 Allergic rhinitis, unspecified: Secondary | ICD-10-CM | POA: Diagnosis not present

## 2020-04-29 DIAGNOSIS — L89154 Pressure ulcer of sacral region, stage 4: Secondary | ICD-10-CM | POA: Diagnosis not present

## 2020-04-29 DIAGNOSIS — I10 Essential (primary) hypertension: Secondary | ICD-10-CM | POA: Diagnosis not present

## 2020-04-29 DIAGNOSIS — D509 Iron deficiency anemia, unspecified: Secondary | ICD-10-CM | POA: Diagnosis not present

## 2020-04-29 DIAGNOSIS — G8220 Paraplegia, unspecified: Secondary | ICD-10-CM | POA: Diagnosis not present

## 2020-04-29 LAB — FSH/LH
FSH: 10.2 m[IU]/mL (ref 1.5–12.4)
LH: 11.8 m[IU]/mL — ABNORMAL HIGH (ref 1.7–8.6)

## 2020-04-29 LAB — PROLACTIN: Prolactin: 8.7 ng/mL (ref 4.0–15.2)

## 2020-04-29 LAB — PSA: Prostate Specific Ag, Serum: 8 ng/mL — ABNORMAL HIGH (ref 0.0–4.0)

## 2020-05-01 DIAGNOSIS — Z79899 Other long term (current) drug therapy: Secondary | ICD-10-CM | POA: Diagnosis not present

## 2020-05-01 DIAGNOSIS — N400 Enlarged prostate without lower urinary tract symptoms: Secondary | ICD-10-CM | POA: Diagnosis not present

## 2020-05-01 DIAGNOSIS — I10 Essential (primary) hypertension: Secondary | ICD-10-CM | POA: Diagnosis not present

## 2020-05-01 DIAGNOSIS — L89154 Pressure ulcer of sacral region, stage 4: Secondary | ICD-10-CM | POA: Diagnosis not present

## 2020-05-01 DIAGNOSIS — D509 Iron deficiency anemia, unspecified: Secondary | ICD-10-CM | POA: Diagnosis not present

## 2020-05-01 DIAGNOSIS — N319 Neuromuscular dysfunction of bladder, unspecified: Secondary | ICD-10-CM | POA: Diagnosis not present

## 2020-05-01 DIAGNOSIS — S24104S Unspecified injury at T11-T12 level of thoracic spinal cord, sequela: Secondary | ICD-10-CM | POA: Diagnosis not present

## 2020-05-01 DIAGNOSIS — J309 Allergic rhinitis, unspecified: Secondary | ICD-10-CM | POA: Diagnosis not present

## 2020-05-01 DIAGNOSIS — G8220 Paraplegia, unspecified: Secondary | ICD-10-CM | POA: Diagnosis not present

## 2020-05-02 DIAGNOSIS — M869 Osteomyelitis, unspecified: Secondary | ICD-10-CM | POA: Diagnosis not present

## 2020-05-02 DIAGNOSIS — L89154 Pressure ulcer of sacral region, stage 4: Secondary | ICD-10-CM | POA: Diagnosis not present

## 2020-05-02 DIAGNOSIS — G8220 Paraplegia, unspecified: Secondary | ICD-10-CM | POA: Diagnosis not present

## 2020-05-02 LAB — CULTURE, URINE COMPREHENSIVE

## 2020-05-04 ENCOUNTER — Telehealth: Payer: Self-pay | Admitting: Family Medicine

## 2020-05-04 DIAGNOSIS — Z79899 Other long term (current) drug therapy: Secondary | ICD-10-CM | POA: Diagnosis not present

## 2020-05-04 DIAGNOSIS — N400 Enlarged prostate without lower urinary tract symptoms: Secondary | ICD-10-CM | POA: Diagnosis not present

## 2020-05-04 DIAGNOSIS — N319 Neuromuscular dysfunction of bladder, unspecified: Secondary | ICD-10-CM | POA: Diagnosis not present

## 2020-05-04 DIAGNOSIS — D509 Iron deficiency anemia, unspecified: Secondary | ICD-10-CM | POA: Diagnosis not present

## 2020-05-04 DIAGNOSIS — J309 Allergic rhinitis, unspecified: Secondary | ICD-10-CM | POA: Diagnosis not present

## 2020-05-04 DIAGNOSIS — G8220 Paraplegia, unspecified: Secondary | ICD-10-CM | POA: Diagnosis not present

## 2020-05-04 DIAGNOSIS — I10 Essential (primary) hypertension: Secondary | ICD-10-CM | POA: Diagnosis not present

## 2020-05-04 DIAGNOSIS — L89154 Pressure ulcer of sacral region, stage 4: Secondary | ICD-10-CM | POA: Diagnosis not present

## 2020-05-04 DIAGNOSIS — S24104S Unspecified injury at T11-T12 level of thoracic spinal cord, sequela: Secondary | ICD-10-CM | POA: Diagnosis not present

## 2020-05-04 MED ORDER — TETRACYCLINE HCL 500 MG PO CAPS: 500.0000 mg | ORAL_CAPSULE | Freq: Two times a day (BID) | ORAL | 0 refills | Status: DC

## 2020-05-04 NOTE — Telephone Encounter (Signed)
Patient notified and ABX sent to pharmacy. Patient states he will go to ID if needed. He states the area is healing with the Nystatin cream.

## 2020-05-04 NOTE — Telephone Encounter (Signed)
-----   Message from Nori Riis, PA-C sent at 05/04/2020  9:49 AM EST ----- Please let Mr. Robb know that his urine culture is positive for infection and we need to start him on tetracycline 500 mg twice daily for seven days.  His PSA is also elevated, but it is likely due to his infection.  He CANNOT start any testosterone therapy at this time, regardless.  We also need to refer him to ID.

## 2020-05-05 LAB — URINALYSIS, COMPLETE
Bilirubin, UA: NEGATIVE
Glucose, UA: NEGATIVE
Ketones, UA: NEGATIVE
Nitrite, UA: POSITIVE — AB
Protein,UA: NEGATIVE
Specific Gravity, UA: 1.015 (ref 1.005–1.030)
Urobilinogen, Ur: 2 mg/dL — ABNORMAL HIGH (ref 0.2–1.0)
pH, UA: 7 (ref 5.0–7.5)

## 2020-05-05 LAB — MICROSCOPIC EXAMINATION: WBC, UA: >30 /hpf — AB (ref 0–5)

## 2020-05-05 NOTE — Telephone Encounter (Signed)
Office note printed and faxed to [place] with the request for patient to use catheters 4 times daily.

## 2020-05-05 NOTE — Telephone Encounter (Signed)
Incoming call from [place] at [facility] stating that they need pt's order needs to be on a hard copy rx with dx codes listed on it in order for insurance to cover.

## 2020-05-05 NOTE — Telephone Encounter (Signed)
Let's have Bradley [name] come back in for an appointment in 2 weeks, so we can recheck his urine and the balanoposthitis before referring him to ID.

## 2020-05-06 ENCOUNTER — Other Ambulatory Visit: Payer: Self-pay

## 2020-05-06 ENCOUNTER — Encounter: Payer: Medicare HMO | Attending: Physician Assistant | Admitting: Internal Medicine

## 2020-05-06 DIAGNOSIS — L97128 Non-pressure chronic ulcer of left thigh with other specified severity: Secondary | ICD-10-CM | POA: Diagnosis not present

## 2020-05-06 DIAGNOSIS — L89154 Pressure ulcer of sacral region, stage 4: Secondary | ICD-10-CM | POA: Diagnosis not present

## 2020-05-06 DIAGNOSIS — L97129 Non-pressure chronic ulcer of left thigh with unspecified severity: Secondary | ICD-10-CM | POA: Insufficient documentation

## 2020-05-06 DIAGNOSIS — D649 Anemia, unspecified: Secondary | ICD-10-CM | POA: Diagnosis not present

## 2020-05-06 DIAGNOSIS — M069 Rheumatoid arthritis, unspecified: Secondary | ICD-10-CM | POA: Insufficient documentation

## 2020-05-06 DIAGNOSIS — I10 Essential (primary) hypertension: Secondary | ICD-10-CM | POA: Insufficient documentation

## 2020-05-06 DIAGNOSIS — Z933 Colostomy status: Secondary | ICD-10-CM | POA: Insufficient documentation

## 2020-05-06 DIAGNOSIS — G8220 Paraplegia, unspecified: Secondary | ICD-10-CM | POA: Insufficient documentation

## 2020-05-06 DIAGNOSIS — L89153 Pressure ulcer of sacral region, stage 3: Secondary | ICD-10-CM | POA: Insufficient documentation

## 2020-05-06 NOTE — Telephone Encounter (Signed)
RX faxed to [facility]

## 2020-05-06 NOTE — Telephone Encounter (Signed)
Unable to reach patient. Phone had message then wouldn't let me leave a message.

## 2020-05-07 ENCOUNTER — Telehealth: Payer: Self-pay | Admitting: *Deleted

## 2020-05-07 DIAGNOSIS — Z79899 Other long term (current) drug therapy: Secondary | ICD-10-CM | POA: Diagnosis not present

## 2020-05-07 DIAGNOSIS — G8220 Paraplegia, unspecified: Secondary | ICD-10-CM | POA: Diagnosis not present

## 2020-05-07 DIAGNOSIS — S24104S Unspecified injury at T11-T12 level of thoracic spinal cord, sequela: Secondary | ICD-10-CM | POA: Diagnosis not present

## 2020-05-07 DIAGNOSIS — N400 Enlarged prostate without lower urinary tract symptoms: Secondary | ICD-10-CM | POA: Diagnosis not present

## 2020-05-07 DIAGNOSIS — D509 Iron deficiency anemia, unspecified: Secondary | ICD-10-CM | POA: Diagnosis not present

## 2020-05-07 DIAGNOSIS — N319 Neuromuscular dysfunction of bladder, unspecified: Secondary | ICD-10-CM | POA: Diagnosis not present

## 2020-05-07 DIAGNOSIS — D508 Other iron deficiency anemias: Secondary | ICD-10-CM

## 2020-05-07 DIAGNOSIS — J309 Allergic rhinitis, unspecified: Secondary | ICD-10-CM | POA: Diagnosis not present

## 2020-05-07 DIAGNOSIS — L89154 Pressure ulcer of sacral region, stage 4: Secondary | ICD-10-CM | POA: Diagnosis not present

## 2020-05-07 DIAGNOSIS — I10 Essential (primary) hypertension: Secondary | ICD-10-CM | POA: Diagnosis not present

## 2020-05-07 NOTE — Telephone Encounter (Signed)
Dr. Tasia Catchings, [place] to add order for requested labs?

## 2020-05-07 NOTE — Telephone Encounter (Signed)
Wound center called requesting that we draw some labs for them when patient comes in tomorrow, CBCD, CMP, CRP She is faxing an order over now

## 2020-05-07 NOTE — Telephone Encounter (Signed)
There is an existing order for CBC so order was added for CMP and CRP.

## 2020-05-07 NOTE — Addendum Note (Signed)
Addended by: Vanice Sarah on: 05/07/2020 03:20 PM   Modules accepted: Orders

## 2020-05-07 NOTE — Telephone Encounter (Signed)
Yes. I assume we are checking cbc cmp anyway. Add CRP

## 2020-05-08 ENCOUNTER — Other Ambulatory Visit: Payer: Self-pay

## 2020-05-08 ENCOUNTER — Inpatient Hospital Stay: Payer: Medicare HMO | Attending: Oncology

## 2020-05-08 DIAGNOSIS — D500 Iron deficiency anemia secondary to blood loss (chronic): Secondary | ICD-10-CM

## 2020-05-08 DIAGNOSIS — D509 Iron deficiency anemia, unspecified: Secondary | ICD-10-CM | POA: Diagnosis not present

## 2020-05-08 DIAGNOSIS — D508 Other iron deficiency anemias: Secondary | ICD-10-CM

## 2020-05-08 LAB — COMPREHENSIVE METABOLIC PANEL
ALT: 9 U/L (ref 0–44)
AST: 15 U/L (ref 15–41)
Albumin: 2.8 g/dL — ABNORMAL LOW (ref 3.5–5.0)
Alkaline Phosphatase: 60 U/L (ref 38–126)
Anion gap: 5 (ref 5–15)
BUN: 10 mg/dL (ref 8–23)
CO2: 31 mmol/L (ref 22–32)
Calcium: 8.6 mg/dL — ABNORMAL LOW (ref 8.9–10.3)
Chloride: 101 mmol/L (ref 98–111)
Creatinine, Ser: 0.46 mg/dL — ABNORMAL LOW (ref 0.61–1.24)
GFR, Estimated: >60 mL/min (ref >60)
Glucose, Bld: 81 mg/dL (ref 70–99)
Potassium: 4 mmol/L (ref 3.5–5.1)
Sodium: 137 mmol/L (ref 135–145)
Total Bilirubin: 0.5 mg/dL (ref 0.3–1.2)
Total Protein: 8.3 g/dL — ABNORMAL HIGH (ref 6.5–8.1)

## 2020-05-08 LAB — SEDIMENTATION RATE: Sed Rate: 91 mm/hr — ABNORMAL HIGH (ref 0–20)

## 2020-05-08 LAB — CBC WITH DIFFERENTIAL/PLATELET
Abs Immature Granulocytes: 0.02 10*3/uL (ref 0.00–0.07)
Basophils Absolute: 0 10*3/uL (ref 0.0–0.1)
Basophils Relative: 0 %
Eosinophils Absolute: 0.2 10*3/uL (ref 0.0–0.5)
Eosinophils Relative: 3 %
HCT: 33.6 % — ABNORMAL LOW (ref 39.0–52.0)
Hemoglobin: 9.7 g/dL — ABNORMAL LOW (ref 13.0–17.0)
Immature Granulocytes: 0 %
Lymphocytes Relative: 34 %
Lymphs Abs: 2.8 10*3/uL (ref 0.7–4.0)
MCH: 20 pg — ABNORMAL LOW (ref 26.0–34.0)
MCHC: 28.9 g/dL — ABNORMAL LOW (ref 30.0–36.0)
MCV: 69.4 fL — ABNORMAL LOW (ref 80.0–100.0)
Monocytes Absolute: 0.7 10*3/uL (ref 0.1–1.0)
Monocytes Relative: 8 %
Neutro Abs: 4.6 10*3/uL (ref 1.7–7.7)
Neutrophils Relative %: 55 %
Platelets: 374 10*3/uL (ref 150–400)
RBC: 4.84 MIL/uL (ref 4.22–5.81)
RDW: 24 % — ABNORMAL HIGH (ref 11.5–15.5)
WBC: 8.3 10*3/uL (ref 4.0–10.5)
nRBC: 0 % (ref 0.0–0.2)

## 2020-05-08 LAB — IRON AND TIBC
Iron: 22 ug/dL — ABNORMAL LOW (ref 45–182)
Saturation Ratios: 10 % — ABNORMAL LOW (ref 17.9–39.5)
TIBC: 227 ug/dL — ABNORMAL LOW (ref 250–450)
UIBC: 205 ug/dL

## 2020-05-08 LAB — C-REACTIVE PROTEIN: CRP: 7.1 mg/dL — ABNORMAL HIGH (ref <1.0)

## 2020-05-08 LAB — FERRITIN: Ferritin: 238 ng/mL (ref 24–336)

## 2020-05-08 NOTE — Progress Notes (Signed)
CALIBER, LANDESS ([id number]) Visit Report for 05/06/2020 Arrival Information Details Patient Name: Bradley [name], Bradley [name]. Date of Service: 05/06/2020 11:00 AM Medical Record Number: [id number] Patient Account Number: [id number] Date of Birth/Sex: [birth year] (74 y.o. M) Treating RN: Dolan Amen Primary Care Kacyn Souder: [name] Other Clinician: Referring Shanikka Wonders: [name] Treating Tevion Laforge/Extender: Tito Dine in Treatment: 68 Visit Information History Since Last Visit Pain Present Now: No Patient Arrived: Wheel Chair Arrival Time: 11:17 Accompanied By: self Transfer Assistance: [place] Patient Identification Verified: Yes Secondary Verification Process Completed: Yes Patient Requires Transmission-Based Precautions: No Patient Has Alerts: No Electronic Signature(s) Signed: 05/06/2020 12:39:26 PM By: Georges Mouse, Minus Breeding Entered By: Georges Mouse, Minus Breeding on 05/06/2020 11:17:27 [name] ([id number]) -------------------------------------------------------------------------------- Encounter Discharge Information Details Patient Name: Bradley Loa A. Date of Service: 05/06/2020 11:00 AM Medical Record Number: [id number] Patient Account Number: [id number] Date of Birth/Sex: 1946-05-10 (74 y.o. M) Treating RN: Dolan Amen Primary Care Jalynn Waddell: [name] Other Clinician: Referring Jaylynn Siefert: [name] Treating Rodolfo Notaro/Extender: Tito Dine in Treatment: 58 Encounter Discharge Information Items Post Procedure Vitals Discharge Condition: Stable Temperature (F): 98.2 Ambulatory Status: Wheelchair Pulse (bpm): 87 Discharge Destination: Home Respiratory Rate (breaths/min): 18 Transportation: Private Auto Blood Pressure (mmHg): 135/78 Accompanied By: self Schedule Follow-up Appointment: Yes Clinical Summary of Care: Electronic Signature(s) Signed: 05/06/2020 12:39:26 PM By: Georges Mouse, Minus Breeding Entered By:  Georges Mouse, Minus Breeding on 05/06/2020 12:23:42 [name] ([id number]) -------------------------------------------------------------------------------- Lower Extremity Assessment Details Patient Name: Bradley Loa A. Date of Service: 05/06/2020 11:00 AM Medical Record Number: [id number] Patient Account Number: [id number] Date of Birth/Sex: 1946-04-29 (74 y.o. M) Treating RN: Dolan Amen Primary Care Natalye Kott: [name] Other Clinician: Referring Burdett Pinzon: [name] Treating Amias Hutchinson/Extender: Tito Dine in Treatment: 42 Electronic Signature(s) Signed: 05/06/2020 12:39:26 PM By: Georges Mouse, Minus Breeding Entered By: Georges Mouse, Minus Breeding on 05/06/2020 11:38:53 [name] ([id number]) -------------------------------------------------------------------------------- Multi Wound Chart Details Patient Name: [name], [name] Date of Service: 05/06/2020 11:00 AM Medical Record Number: [id number] Patient Account Number: [id number] Date of Birth/Sex: [birth year] (74 y.o. M) Treating RN: Dolan Amen Primary Care Navdeep Fessenden: [name] Other Clinician: Referring Aniayah Alaniz: [name] Treating Blakeley Margraf/Extender: Tito Dine in Treatment: 42 Vital Signs Height(in): Pulse(bpm): 49 Weight(lbs): Blood Pressure(mmHg): 135/78 Body Mass Index(BMI): Temperature(F): 98.2 Respiratory Rate(breaths/min): 18 Photos: [N/A:N/A] Wound Location: Midline Sacrum Left, Proximal Upper Leg N/A Wounding Event: Pressure Injury Gradually Appeared N/A Primary Etiology: Pressure Ulcer Pressure Ulcer N/A Comorbid History: Anemia, Hypertension, History of Anemia, Hypertension, History of N/A pressure wounds, Rheumatoid pressure wounds, Rheumatoid Arthritis, Paraplegia Arthritis, Paraplegia Date Acquired: 06/07/2015 03/06/2020 N/A Weeks of Treatment: 42 3 N/A Wound Status: Open Open N/A Measurements L x W x D (cm) 15.2x9x8 5.5x2.7x0.1 N/A Area (cm) :  107.442 11.663 N/A Volume (cm) : 859.54 1.166 N/A % Reduction in Area: -108.50% 25.80% N/A % Reduction in Volume: -178.00% 25.80% N/A Classification: Category/Stage IV Unstageable/Unclassified N/A Exudate Amount: Large Large N/A Exudate Type: Serous Serous N/A Exudate Color: amber amber N/A Wound Margin: Epibole Flat and Intact N/A Granulation Amount: Medium (34-66%) Small (1-33%) N/A Granulation Quality: Pink Pink N/A Necrotic Amount: Medium (34-66%) Large (67-100%) N/A Exposed Structures: Fat Layer (Subcutaneous Tissue): Fat Layer (Subcutaneous Tissue): N/A Yes Yes Muscle: Yes Fascia: No Bone: Yes Tendon: No Fascia: No Muscle: No Tendon: No Joint: No Joint: No Bone: No Epithelialization: None Small (1-33%) N/A Debridement: N/A Debridement - Excisional N/A Pre-procedure Verification/Time N/A 11:47 N/A Out Taken: Tissue Debrided: N/A Subcutaneous, Slough N/A Level: N/A  Skin/Subcutaneous Tissue N/A Debridement Area (sq cm): N/A 14.85 N/A Instrument: N/A Curette N/A Bleeding: N/A Moderate N/A Hemostasis Achieved: N/A Silver Nitrate N/A Procedural Pain: N/A 0 N/A Post Procedural Pain: N/A 0 N/A N/A Procedure was tolerated well N/A JEFFRE, ENRIQUES ([id number]) Debridement Treatment Response: Post Debridement N/A 5.5x2.7x0.2 N/A Measurements L x W x D (cm) Post Debridement Volume: N/A 2.333 N/A (cm) Post Debridement Stage: N/A Unstageable/Unclassified N/A Procedures Performed: N/A Debridement N/A Treatment Notes Wound #10 (Midline Sacrum) Notes dakins soaked guaze, abd, tape Wound #12 (Left, Proximal Upper Leg) Notes dakins soaked guaze, abd, tape Electronic Signature(s) Signed: 05/07/2020 12:56:38 PM By: Linton Ham MD Entered By: Linton Ham on 05/06/2020 12:24:54 [name] ([id number]) -------------------------------------------------------------------------------- Multi-Disciplinary Care Plan Details Patient Name: [name], [name] Date of  Service: 05/06/2020 11:00 AM Medical Record Number: [id number] Patient Account Number: [id number] Date of Birth/Sex: [birth year] (74 y.o. M) Treating RN: Dolan Amen Primary Care Adrionna Delcid: [name] Other Clinician: Referring Ramya Vanbergen: [name] Treating Nabil Bubolz/Extender: Tito Dine in Treatment: 29 Active Inactive Abuse / Safety / Falls / Self Care Management Nursing Diagnoses: Impaired physical mobility Goals: Patient will not develop complications from immobility Date Initiated: 07/16/2019 Target Resolution Date: 02/07/2020 Goal Status: Active Interventions: Assess Activities of Daily Living upon admission and as needed Notes: Pressure Nursing Diagnoses: Knowledge deficit related to causes and risk factors for pressure ulcer development Goals: Patient will remain free from development of additional pressure ulcers Date Initiated: 07/16/2019 Target Resolution Date: 02/07/2020 Goal Status: Active Interventions: Provide education on pressure ulcers Notes: Electronic Signature(s) Signed: 05/06/2020 12:39:26 PM By: Georges Mouse, Minus Breeding Entered By: Georges Mouse, Minus Breeding on 05/06/2020 11:45:02 [name] ([id number]) -------------------------------------------------------------------------------- Pain Assessment Details Patient Name: Bradley Loa A. Date of Service: 05/06/2020 11:00 AM Medical Record Number: [id number] Patient Account Number: [id number] Date of Birth/Sex: 08-25-1945 (74 y.o. M) Treating RN: Dolan Amen Primary Care Shawnese Magner: [name] Other Clinician: Referring Jodene Polyak: [name] Treating Felica Chargois/Extender: Tito Dine in Treatment: 67 Active Problems Location of Pain Severity and Description of Pain Patient Has Paino No Site Locations Rate the pain. Current Pain Level: 0 Pain Management and Medication Current Pain Management: Electronic Signature(s) Signed: 05/06/2020 12:39:26 PM By:  Georges Mouse, Minus Breeding Entered By: Georges Mouse, Minus Breeding on 05/06/2020 11:23:14 [name] ([id number]) -------------------------------------------------------------------------------- Patient/Caregiver Education Details Patient Name: Bradley Loa A. Date of Service: 05/06/2020 11:00 AM Medical Record Number: [id number] Patient Account Number: [id number] Date of Birth/Gender: 10-22-1945 (74 y.o. M) Treating RN: Cornell Barman Primary Care Physician: [name] Other Clinician: Referring Physician: [name] Treating Physician/Extender: Tito Dine in Treatment: 43 Education Assessment Education Provided To: Patient Education Topics Provided Pressure: Handouts: Other: keep off of wounded area Methods: Demonstration, Explain/Verbal Responses: State content correctly Wound/Skin Impairment: Handouts: Caring for Your Ulcer, Other: continue wound care as prescribed Methods: Demonstration Responses: State content correctly Electronic Signature(s) Signed: 05/08/2020 4:55:43 PM By: Gretta Cool, BSN, RN, CWS, Kim RN, BSN Entered By: Gretta Cool, BSN, RN, CWS, Kim on 05/06/2020 12:39:12 [name] ([id number]) -------------------------------------------------------------------------------- Wound Assessment Details Patient Name: Bradley Loa A. Date of Service: 05/06/2020 11:00 AM Medical Record Number: [id number] Patient Account Number: [id number] Date of Birth/Sex: 1945/11/12 (74 y.o. M) Treating RN: Dolan Amen Primary Care Dasean Brow: [name] Other Clinician: Referring Caisen Mangas: [name] Treating Marrisa Kimber/Extender: Ricard Dillon Weeks in Treatment: 42 Wound Status Wound Number: 10 Primary Pressure Ulcer Etiology: Wound Location: Midline Sacrum Wound Open Wounding Event: Pressure Injury Status: Date Acquired: 06/07/2015 Comorbid  Anemia, Hypertension, History of pressure wounds, Weeks Of Treatment: 42 History: Rheumatoid Arthritis,  Paraplegia Clustered Wound: No Photos Wound Measurements Length: (cm) 15.2 % Reduct Width: (cm) 9 % Reduct Depth: (cm) 8 Epitheli Area: (cm) 107.442 Tunneli Volume: (cm) 859.54 Undermi ion in Area: -108.5% ion in Volume: -178% alization: None ng: No ning: No Wound Description Classification: Category/Stage IV Foul Odo Wound Margin: Epibole Slough/F Exudate Amount: Large Exudate Type: Serous Exudate Color: amber r After Cleansing: No ibrino Yes Wound Bed Granulation Amount: Medium (34-66%) Exposed Structure Granulation Quality: Pink Fascia Exposed: No Necrotic Amount: Medium (34-66%) Fat Layer (Subcutaneous Tissue) Exposed: Yes Necrotic Quality: Adherent Slough Tendon Exposed: No Muscle Exposed: Yes Necrosis of Muscle: No Joint Exposed: No Bone Exposed: Yes Treatment Notes Wound #10 (Midline Sacrum) Notes dakins soaked guaze, abd, tape MORSE, BRUEGGEMANN ([id number]) Electronic Signature(s) Signed: 05/06/2020 12:39:26 PM By: Georges Mouse, Minus Breeding Entered By: Georges Mouse, Minus Breeding on 05/06/2020 11:38:00 [name] ([id number]) -------------------------------------------------------------------------------- Wound Assessment Details Patient Name: Bradley Loa A. Date of Service: 05/06/2020 11:00 AM Medical Record Number: [id number] Patient Account Number: [id number] Date of Birth/Sex: March 02, 1946 (74 y.o. M) Treating RN: Dolan Amen Primary Care Pearlie Nies: [name] Other Clinician: Referring Athan Casalino: [name] Treating Shanika Levings/Extender: Tito Dine in Treatment: 42 Wound Status Wound Number: 12 Primary Pressure Ulcer Etiology: Wound Location: Left, Proximal Upper Leg Wound Open Wounding Event: Gradually Appeared Status: Date Acquired: 03/06/2020 Comorbid Anemia, Hypertension, History of pressure wounds, Weeks Of Treatment: 3 History: Rheumatoid Arthritis, Paraplegia Clustered Wound: No Photos Wound  Measurements Length: (cm) 5.5 Width: (cm) 2.7 Depth: (cm) 0.1 Area: (cm) 11.663 Volume: (cm) 1.166 % Reduction in Area: 25.8% % Reduction in Volume: 25.8% Epithelialization: Small (1-33%) Tunneling: No Undermining: No Wound Description Classification: Unstageable/Unclassified Fou Wound Margin: Flat and Intact Slo Exudate Amount: Large Exudate Type: Serous Exudate Color: amber l Odor After Cleansing: No ugh/Fibrino Yes Wound Bed Granulation Amount: Small (1-33%) Exposed Structure Granulation Quality: Pink Fascia Exposed: No Necrotic Amount: Large (67-100%) Fat Layer (Subcutaneous Tissue) Exposed: Yes Necrotic Quality: Adherent Slough Tendon Exposed: No Muscle Exposed: No Joint Exposed: No Bone Exposed: No Treatment Notes Wound #12 (Left, Proximal Upper Leg) Notes dakins soaked guaze, abd, tape Electronic Signature(s) Signed: 05/06/2020 12:39:26 PM By: Georges Mouse, Amedeo Kinsman, Gerda Diss ([id number]) Entered By: Georges Mouse, Minus Breeding on 05/06/2020 11:38:32 [name] ([id number]) -------------------------------------------------------------------------------- Vitals Details Patient Name: Bradley Loa A. Date of Service: 05/06/2020 11:00 AM Medical Record Number: [id number] Patient Account Number: [id number] Date of Birth/Sex: Nov 13, 1945 (74 y.o. M) Treating RN: Dolan Amen Primary Care Ezana Hubbert: [name] Other Clinician: Referring Johncarlos Holtsclaw: [name] Treating Pervis Macintyre/Extender: Tito Dine in Treatment: 42 Vital Signs Time Taken: 11:17 Temperature (F): 98.2 Pulse (bpm): 87 Respiratory Rate (breaths/min): 18 Blood Pressure (mmHg): 135/78 Reference Range: 80 - 120 mg / dl Electronic Signature(s) Signed: 05/06/2020 12:39:26 PM By: Georges Mouse, Minus Breeding Entered By: Georges Mouse, Minus Breeding on 05/06/2020 11:17:48

## 2020-05-08 NOTE — Telephone Encounter (Signed)
LMOM for patient to call our office and schedule a appointment to see [facility].

## 2020-05-08 NOTE — Progress Notes (Signed)
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 proximity to the anus. Apparently the patient was offered [name] diverting colostomy at one point. In any case he is only been using wet to dry dressings surprisingly changing this himself at home using [name] mirror. He does not have home health. He does have [name] level II pressure-relief surface as well as [name] Roho cushion for his wheelchair. In spite of this the wound is considerably larger one than when he was last in the clinic currently measuring 12.5 x 7. There is also an area superiorly in the wound that tunnels more deeply. Clearly [name] stage III wound 03/15/16 patient presents today for reevaluation concerning his midline sacral pressure ulcer. This again is an extensive ulcer which does not extend to bone fortunately but is sufficiently large to make healing of this wound difficult. Again he has been seen at [facility] where apparently they did discuss with him the possibility of [name] diverting colostomy but he did not want any part of that. Subsequently he has not followed up there currently. He continues overall to do fairly well all things considered with this  wound. He is currently utilizing Medihoney Santyl would be extremely expensive for the amount he would need and likely cost prohibitive. 03/29/16; we'll follow this patient on an every two-week basis. He has [name] fairly substantial stage 3 pressure ulcer over his lower sacrum and coccyx and extending into his bilateral gluteal areas left greater than right. He now has home health. [facility] think [facility]. He is applying Medihoney, kerlix and border foam. He arrives today with the intake nurse reporting [name] large amount of drainage. The patient stated he put his dressing on it 7:00 this morning by the time he arrived here at 10 there was already [name] moderate to [name] large amount of drainage. I once again reviewed his history. He had an attempted closure with myocutaneous flap earlier this year at [facility]. This did not go well. He was offered [name] diverting colostomy but refused. He is not [name] candidate for [name] wound VAC as the actual wound is precariously close to his anal opening. As mentioned he does have [facility] but miraculously this patient who is [name] paraplegic is actually changing the dressings himself. 04/12/16 patient presents today for [name] follow-up of his essentially large sacral pressure ulcer stage III. Nothing has changed dramatically since I last saw him about one month ago. He has seen Dr. Dellia Nims once the interim. With that being said patient's wound appears somewhat less macerated today compared to previous evaluations. He still has no pain being [name] quadriplegic. 04-26-16 Mr. Gum returns today for [name] violation of his stage III sacral pressure ulcer he denies any complaints concerns or issues over the past 2 weeks. He missed to changing dressing twice daily  due to drainage although he states this is not an increase in drainage over the past 2 weeks. He does change his dressings independently. He admits to sitting in his motorized chair for no more than 2-3 hours at which time he transfers to bed  and rotates lateral position. 05/10/16; Treavon Castilleja returns today for review of his stage III sacral pressure ulcer. He denies any concerns over the last 2 weeks although he seems to be running out of Aquacel Ag and on those days he uses Medihoney. He has [facility] was supplying his dressings. He still complains of drainage. He does his dressings independently. He has in his motorized chair for 2-3 hours that time other than that he offloads this. Dimensions of the wound are down 1 cm in both directions. He underwent an aggressive debridement on his last visit of thick circumferential skin and subcutaneous tissue. It is possible at some point in the future he is going to need this done again 05/24/16; the patient returns today for review of his stage III sacral pressure ulcer. We have been using Aquacel Ag he tells me that he changes this up to twice [name] day. I'm not really certain of the reason for this frequency of changing. He has some involvement from the home health nurses but I think is doing most of the changing himself which I think because of his paraplegia would be [name] very difficult exercise. Nevertheless he states that there is "wetness". I am not sure if there is another dressing that we could easily changed that much. I'd wanted to change to [facility] but I'll need to have [name] sense of how frequent he would need to change this. 06/14/16; this is [name] patient returns for review of his stage III sacral pressure ulcer. We have been using Aquacel Ag and over the last 2 visits he has had extensive debridement so of the thick circumferential skin and subcutaneous tissue that surrounds this wound. In spite of this really absolutely no change in the condition of the wound warrants measurements. We have [facility] home health I believe changing the dressing on 3 occasions the patient states he does this on one occasion himself 06/28/16; this is [name] patient who has [name] fairly large stage III sacral  pressure ulcer. I changed him to [facility] from Aquacel 2 weeks ago. He SANEL, STEMMER ([id number]) returns today in follow-up. In the meantime [name] nurse from [facility] has calledrequesting ordering of [name] wound VAC. He had this discussion before. The problem is the proximity of the lowest edge of this wound to the patient's anal opening roughly 3/4 of an inch. Can't see how this can be arranged. Apparently the nurse who is calling has [name] lot of experience, the question would be then when she is not available would be doing this. I would not have thought that this wound is not amenable to [name] wound VAC because of this reason 07/12/16; the patient comes in today and I have signed orders for [name] wound VAC. The home health team through [facility] is convinced that he can benefit from this even though there is close proximity to his anal opening beneath the gluteal clefts. The patient does not have [name] bowel regimen but states he has [name] bowel movement every 2 days this will also provide some problem with regards to the vac seal 07/26/16; the patient never did obtain [name] Medellin wound VAC as he could not afford the $200 per month co-pay we have  been using Hydrofera Blue now for 6 weeks or so. No major change in this wound at all. He is still not interested in the concept of plastic surgery. There changing the dressing every second day 08/09/16; the patient arrives with [name] wound precisely in the same situation. In keeping with the plan I outlined last time extensive debridement with an open curet the surface of this is not completely viable. Still has some degree of surrounding thick skin and subcutaneous tissue. No evidence of infection. Once again I have had [name] conversation with him about plastic surgery, he is simply not interested. 08/23/16; wound is really no different. Thick circumferential skin and subcutaneous tissue around the wound edge which is [name] lot better from debridement we did earlier in the year.  The surface of the wound looks viable however with [name] curet there is definitely [name] gritty surface to this. We use Medihoney for [name] while, he could not afford Santyl. I don't think we could get [name] supply of Iodoflex. He talks [name] little more positively about the concept of plastic surgery which I've gone over with him today 08/31/16;; patient arrives in clinic today with the wound surface really no different there is no changes in dimensions. I debrided today surface on the left upper side of this wound aggressively week ago there is no real change here no evidence of epithelialization. The problem with debridement in the clinic is that he believes from this very liberally. We have been using Sorbact. 09/21/16; absolutely no change in the appearance or measurements of this wound. More recently I've been debrided in this aggressively and using sorbact to see if we could get to [name] better wound surface. Although this visually looks satisfactory, debridement reveals [name] very gritty surface to this. However even with this debridement and removal of thick nonviable skin and subcutaneous tissue from around the large amount of the circumference of this wound we have made absolutely no progress. This may be an offloading issue I'm just not completely certain. It has 2 close proximity in its inferior aspect to consider negative pressure therapy 10/26/16; READMISSION This patient called our clinic yesterday to report an odor in his wound. He had been to see plastic surgery at [facility] at our request after his last visit on 09/21/16; we have been seeing him for several months with [name] large stage III wound. He had been sent to general surgery for consideration of [name] colostomy, that appointment was not until mid June He comes in today with [name] temperature of 101. He is reporting an odor in the wound since last weekend. 01/10/17 Readmission: 01/10/17 On evaluation today it is noted that patient has been seen by plastic surgery at [facility] since  he was last evaluated here. They did discuss with him the possibility of [name] flap according to the notes but unfortunately at this point he was not quite ready to proceed with surgery and instead wanted to give the Wound VAC [name] try. In the hospital they were able to get [name] good seal on the Wound VAC. Unfortunately since that time they have been having trouble in regard to his current home health company keeping [name] simple on the Wound VAC. He would like to switch to [name] different home health company. With that being said it sounds as if the problem is that his wound VAC is not feeling at the lower portion of his back and he tells me that he can take some of the clear plastic and put over  that area when the sill breaks and it will correct it for time. He has no discomfort or pain which is good news. He has been treated with IV vancomycin since he was last seen here and has an appointment with [name] infectious disease specialist in two days on 01/12/17. Otherwise he was transferred back to [place] for continuing to monitor and manage is wound as she progresses with [name] Wound VAC for the time being. 01/17/17 on evaluation today patient continues to show evidence of slight improvement with the Wound VAC fortunately there's no evidence of infection or otherwise worsening condition in general. Nonetheless we were unable to get him switch to [facility] in regard to home help from his current company. I'm not sure the reasoning behind but for some reason he was not accepted as [name] patient with him. Continue to apply the Wound VAC which does still show that some maceration around the wound edges but the wound measurements were slightly improved. No fevers, chills, nausea, or vomiting noted at this time. 02/14/17; this patient I have not seen in 5 months although he has been readmitted to our clinic seen by our physician assistant Jeri Cos twice in early August. I have looked through [facility] notes care everywhere. The patient saw  plastic surgery in May [Dr. Bhatt}. The patient was sent to general surgery and ultimately had [name] colostomy placed. On 11/29/16. This was after he was admitted to [facility] sometime in May. An MRI of the pelvis on 5/23 showed osteomyelitis of the coccyx. An attempt was made to drain fluid that was not successful. He was treated with empiric broad-spectrum antibiotics VAC/cefepime/Flagyl starting on 11/02/16 with plans for [name] 6 week course. According to their notes he was sent to [name] nursing home. Was last seen by Dr. Myriam Jacobson of plastic surgery on 12/28/16. The first part of the note is [name] long dissertation about the difficulties finding adequate patients for flap closure of pressure ulcers. At that time the wound was noted to be stage IV based I think on underlying infection no exposed bone and healthy granulation tissue. 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 He was last seen by infectious disease 01/12/17 [name] Dr. Uvaldo Rising. His note says that Mr. Brach was not interested in [name] flap closure for referring [name] trial of the wound VAC. As previously anticipated the wound VAC could not be maintained as an outpatient in the community. He is now using something similar to [name] Dakin's wet to dry recommended by [facility] VASHE solution. He is placing this twice [name] day himself. This is almost s hopeless setting in terms of heeling 02/28/17; he is using [name] Dakin's wet to dry. Most of the wound surface looks satisfactory however the deeper area over his coccyx now has exposed bone I'm not sure if I noted this last week. 03/21/17; patient is usingVASHE solution wet to dry which I gather is [name] variation on Dakin solution. He has home health changing this 3 times [name] week the other days he does this himself. His appointment with plastic surgery 04/18/17; patient continues to use [name] variant of Dakin solution I believe. His wound continues to have [name] clean viable surface. The 2 areas of exposed bone in the  center of this wound had closed over. He has an appointment with plastic surgery on December 5 at which time I hope that there'll be [name] plan for myocutaneous flap closure In looking through [place] I couldn't  find any more plastic surgery appointments. I did come across the fact that he is been followed by hematology for [name] microcytic hypochromic anemia. He had [name] reasonably normal looking hemoglobin electrophoresis. His iron level was 10 and according to the patient he is going for IV iron infusions starting tomorrow. He had [name] sedimentation rate of 74. More problematically from [name] pure wound care point of view his albumin was 2.7 earlier this month 05/17/17; this is [name] patient I follow monthly. He has [name] large now stage IV wound over his bilateral buttocks with close proximity to his anal ELVIN, MCCARTIN [name]. ([id number]) opening. More recently he has developed [name] large area with exposed bone in the center of this probably secondary to the underlying osteomyelitis E had in the summer. He also follows with Dr. Myriam Jacobson at [facility] who is plastic surgery. He had an appointment earlier this month and according to the patient Dr. Myriam Jacobson does not want to proceed with any attempted flap closure. Although I do not have current access to her note in care everywhere this is likely due to exposed bone. Again according to the patient they did [name] bone biopsy. He is still using [name] variant of Dakin solution changing twice [name] day. He has home health. The patient is not able to give me [name] firm answer about how long he spends on this in his wheelchair The patient also states that Dr. Myriam Jacobson wanted to reconsider [name] wound VAC. I really don't see this as [name] viable option at least not in the outpatient setting. The wound itself is frankly to close to his anal opening to maintain [name] seal. The last time we tried to do this home health was unable to manage it. It might be possible to maintain [name] wound VAC in this setting outside of the home  such as [name] skilled nursing facility or an [place] however I am doubtful about this even in that setting **** READMISSION 09/21/17-He is here for evaluation of stage IV sacral ulcer. Since his last evaluation here in December he has completed treatment for sacral osteomyelitis. He was at [facility] for IV therapy and NPWT dressing changes. He was discharged, with home health services, in February. He admits that while in the skilled facility he had "80%" success with maintaining dressing, since discharge he has had approximately "40%" success with maintaining wound VAC dressing. We discussed at length that this is not [name] safe or recommended option. We will apply Dakin's wet to dry dressing daily and he will follow-up next week. He is accompanied today by his sister who is willing to assist in dressing changes; they will discuss the social issue as he feels he is capable of changing dressing daily when home health is not able. 09/28/17-He is here in follow-up evaluation for stage IV sacral pressure ulcer. He has been using the Dakin's wet-to-dry daily; he continues with home health. He is not accompanied by anyone at this visit. He will follow up in two weeks per his request/preference. 10/12/17 on evaluation today patient appears to be doing very well. The Dakin solution went to dry packings do seem to be helping him as far as the sacral wound is concerned I'm not seeing anything that has me more concerned as far as infection or otherwise is concerned. Overall I'm pleased with the appearance of the wound. 10/26/17-He is here in follow up evaluation for [name] stage IV sacral ulcer. He continues with daily Dakin's wet-to-dry. He is voicing no complaints or concerns.  He will follow-up in 2 weeks 11/16/17-He is here for follow up evaluation for [name] palliative stage IV sacral pressure ulcer. We will continue with Dakin's wet-to-dry. He will follow- up in 4 weeks. He is expressing concern/complaint regarding new  bed that has arrived, stating he is unable to manipulate/maneuver it due to the bed crank being at the foot of the bed. 12/14/17-He is here for evaluation for palliative stage IV sacral ulcer. He is voicing no complaints or concerns. We will continue with one-to-one ratio of saline and dakins. He will follow-up in 3 weeks 01/04/18-He is here in follow-up evaluation for palliative stage IV sacral ulcer. He is inquiring about reinstating the negative pressure wound therapy. We discussed at length that the negative pressure wound therapy in the home setting has not been successful for him repeatedly with loss of cereal and unavailability of 24/7 help; reminded him that home health is not available 24/7 when loss of seal occurs. He does verbalize understanding to this and does not pursue. We also discussed the palliative nature of this ulcer (given no significant change/improvement in measurement/appearance, not [name] candidate for muscle flap per plastic surgery, and continued independent living) and that the goal is for maintenance, decrease in infection and minimizing/avoiding deterioration given that he is independent in his care, does not have home health and requires daily dressing changes secondary to drainage amount. He is inquiring about [name] wound clinic in [facility], I have informed him that I am unfamiliar with that clinic but that he is encouraged to seek another opinion if that is his desire. We will continue with dakins and he will follow up in three weeks 01/25/18-He is here in follow-up evaluation for palliative stage IV sacral ulcer. He continues with Dakin's/saline 1:1 mixture wet to dry dressing changes. He states he has an appointment at [facility] on 9/17 for evaluation of surgical intervention/closure of the sacral ulcer. He will follow-up here in 4 weeks Readmission: 07/16/2019 upon evaluation today patient appears to be doing really about the same as when he was previously seen here in the wound  care center. He most recently was [name] patient of [place] back when she was still working here in the center but had been referred to [facility] for consideration of [name] flap. With that being said the surgeon there at [facility] stated that this was too large for her flap and they have been attempting to get this smaller in order to be able to proceed with [name] flap. Nonetheless unfortunately he has had [name] cycle of going back and forth between the osteomyelitis flaring and then sent him back and then making [name] little bit of progress only to be sent back again. It sounds like most recently they have been using [name] Iodoflex type dressing at this point which does not seem to have done any harm by any means. With that being said this wound seems to be quite large for using Iodoflex throughout and subsequently I think he may do much better with the use of Vioxx moistened gauze which would be safe for the new tissue growing and also keep the wound quite nice and clean. The patient is not opposed to this he in fact states that his home health nurse had mentioned this as [name] possibility as well. 07/23/2019 upon evaluation today patient actually appears to be doing very well in regard to his sacral wound. In fact this is much healthier the measurements not terribly different but again with [name] wound like this at home  necessarily expect [name] [name] lot of change as far as the overall measurements are concerned in just 1 week's time. This is going be [name] much longer term process at this point. With that being said I do think that he is very healthy appearing as far as the base of the wound is concerned. 08/06/2019 upon evaluation today patient actually appears to be doing about the same with regard to his wound to be honest. There is really not [name] significant improvement overall based on what I am seeing today. Fortunately there is no signs of significant systemic infection. No fevers, chills, nausea, vomiting, or diarrhea. With that being said there is  odor to the drainage from the wound and subsequently also what appears to be increased drainage based on what we are seeing today as well as what his home health nurse called [place] about that she was concerned with as well. 08/13/2019 upon evaluation today patient appears to be doing about the same at this point with regard to his wound although I think the dressing may be [name] little less drainage wise compared to what it was previous. Fortunately there is no signs of active infection at this time. No fevers, chills, nausea, vomiting, or diarrhea. He has been taking the antibiotic for only 3 days. 08/23/2019 upon evaluation today patient appears to be doing not nearly as well with regard to his wound. In general really not making [name] lot of progress which is unfortunate. There does not appear to be any signs of active infection at this time. No fevers, chills, nausea, vomiting, or diarrhea. With that being said the patient unfortunately does seem to be experiencing continued epiboly around the edges of the wound as well as significant scar tissue he has been in the majority of his day sitting in his chair which is likely [name] big reason for all of this. Fortunately there is no signs of active infection at this time. No fevers, chills, nausea, vomiting, or diarrhea. 08/29/2019 upon evaluation today patient's wound actually appears to be showing some signs of improvement. The region of few areas of new skin growth around the edges of the wound even where he has some of the epiboly. This is actually good news and I think that he has been very aggressive and offloading over the past week since we showed him the pictures of his wound last week. There still may be [name] chance that he is ALASSANE, KALAFUT. ([id number]) going require some sharp debridement to clear away some of these edges but to be honest I think that is can be quite an undertaking. The main reason is that he has [name] lot of thickened scar tissue and it is good to  bleed quite significantly in my opinion. The I think if organ to do that we may even need to have [name] portable or disposable cautery in order to make sure that we are able to get the area completely sealed up as far as bleeding is concerned. I do have one that we can utilize. However being that the wound looks so much better right now would like to give this 2 weeks to see how things stand and look at that point before making any additional recommendations. 09/12/2019 upon evaluation today patient appears to be making some progress as far as offloading is concerned. He still has [name] substantial wound but nonetheless he tells me he is off loading much more aggressively at this point. This is obviously good news. No fevers, chills, nausea,  vomiting, or diarrhea. 4/22; I have not seen this patient in quite some time. No major change in the condition of the wound or its wound volume. Surrounding maceration of the skin around the edges of the wound. The wound is fairly substantial. To close to the anal opening to consider [name] wound VAC. He had extensive trials of plastic surgery at [facility] which really does not result in any improvement. His wound bed is however clean 10/10/2019 upon evaluation today patient appears to be doing well with regard to the wound in the sacral region. He has been tolerating the dressing changes without complication. Fortunately there is no signs of infection and he actually has some epithelial growth noted as well. He tells me has been trying to continue to offload this area which is excellent that is what he needs to do most as far as what he can have in his control. Otherwise I feel like the collagen with [name] saline moistened gauze behind has been beneficial for him. 5/20; collagen and normal saline wet-to-dry. Although the tissue when this large sacral wound looks reasonable there is been absolutely no benefit to the overall closure. He has macerated skin around this. He tells me  that he spends [name] large part of the day up in the wheelchair. He still has home health 11/07/2019 upon evaluation today patient appears to be doing well with regard to his wounds at this point. Fortunately there is no signs of active infection at this time. Overall I feel like his sacral wound in general appears to be doing quite nicely. I am very pleased with overall how things are progressing and I feel like the patient is making excellent progress towards resolution. With that being said he still has [name] long ways to go before we expect to see this completely healed. 11/21/2019 upon evaluation today patient appears to be doing about the same in regard to his sacral wound. Unfortunately he is really not making much in the way of improvement when I questioned him about how much time he is really spending sitting on his bottom he tells me that the majority of his day is mainly in that position though he does spend some time in bed. It does not sound like it is enough however neither does it look like it is enough. 12/24/2019 upon evaluation today patient appears to be doing really about the same there is no significant improvement overall in his wound bed. Last time he was seen here we did have [name] discussion about the possibility of seeing if I can get [name] surgeon to evaluate and potentially clear away some of the thickened edges of the wound in order to allow this wound to potentially have [name] chance of closing with [name] wound VAC. Right now we could not even get [name] wound VAC to seal with some of the rolled and thickened edges. With that being said elected to the patient to think about this to discuss today. At this point the patient does want to see if I could make the referral to potentially see if the surgeon would be able to perform this and subsequently he agrees to go to [name] skilled nursing facility following if they are able to do this in order to allow for [name] wound VAC and to get this wound to heal more  effectively and quickly. In my opinion the only way that I would recommend that he see the surgeon to clear away the edges would be if he  is also in agreement with going to skilled nursing facility as I know he cannot appropriately offload at home to take care of this and that setting. 01/07/2020 on evaluation today patient appears to be doing about the same in regard to his wound. There is no signs of active infection at this time which is great news. All in all he is really doing about the same. We have made referral to surgery to see if they can potentially perform debridement to clear away some of the scarred edging of the wound so we could potentially see about [name] wound VAC to try to help this heal more efficiently. Again we have discussed that if this occurs he would be in [name] skilled nursing facility following or release would need to be in order to appropriately offload. Nonetheless we are still in the process of working that referral. 01/21/2020 upon evaluation today patient appears to be doing about the same in regard to his sacral wound. He did see Dr. Lysle Pearl Dr. Ines Bloomer feeling was that there was not anything surgically he could offer at this point as he did not feel like that [name] wound VAC would likely be appropriate for the patient. With that being said he did recommend potential for trying to get the patient into [name] skilled nursing facility as an outpatient and they will get me working on that as well. Fortunately there is no signs of active infection at this time. 11/10; this is [name] patient that has not been seen here in quite [name] bit of time while we were in our alternative venue. As well I have not seen this patient in probably over 2 years. As I remember things with him he has had this pressure ulcer encompassing his lower sacrum and coccyx and the surrounding buttocks for [name] prolonged period of time. When he first came here the surface looked fairly good and the wound was smaller we attempted [name]  wound VAC but we could never keep this in place. Because of nonresponse of the wound to topical dressings we referred him to [facility] plastic surgery where preparation was being made for flap closure. Intact he had [name] colostomy however after that he was deemed to have 2 large wound for flap closure. He did have osteomyelitis in the coccyx area in 2019. He is using wet-to-dry he has home health. They are managing his ostomy supplies as well. The wound is substantially larger than what I remember. Versus last time he was here to wound months or so ago he has an unstageable wound on the left upper thigh posteriorly. He tells me he is in his chair quite [name] bit especially recently 12/1; patient comes back to clinic. He did not get the lab work done apparently home health would not do this they referred him to the lab where he is apparently having [name] CBC checked because he is on iron infusions. We will try to get the lab work I ordered done there. He also did not get the x-ray he says he does not remember the discussion. The issue is that this large wound has probing areas to bone. He may have underlying osteomyelitis again he was treated for this previously I think in 2019 Objective RAMELLO, CORDIAL [name]. ([id number]) Constitutional Sitting or standing Blood Pressure is within target range for patient.. Pulse regular and within target range for patient.Marland Kitchen Respirations regular, non- labored and within target range.. Temperature is normal and within the target range for the patient.Marland Kitchen appears in  no distress. Vitals Time Taken: 11:17 AM, Temperature: 98.2 F, Pulse: 87 bpm, Respiratory Rate: 18 breaths/min, Blood Pressure: 135/78 mmHg. General Notes: Wound exam; patient has [name] very large wound over his sacrum and surrounding buttocks. There are areas here that easily probes to bone. No purulent drainage most of the granulation tissue looks satisfactory. On his left posterior thigh in close proximity to the  gluteal fold he has [name] superficial but full-thickness skin injury. This does not have [name] viable surface I used [name] #5 curette to remove as much fibrinous surface debris as possible but he bleeds very easily hemostasis with [name] pressure dressing Integumentary (Hair, Skin) Wound #10 status is Open. Original cause of wound was Pressure Injury. The wound is located on the Midline Sacrum. The wound measures 15.2cm length x 9cm width x 8cm depth; 107.442cm^2 area and 859.54cm^3 volume. There is bone, muscle, and Fat Layer (Subcutaneous Tissue) exposed. There is no tunneling or undermining noted. There is [name] large amount of serous drainage noted. The wound margin is epibole. There is medium (34-66%) pink granulation within the wound bed. There is [name] medium (34-66%) amount of necrotic tissue within the wound bed including Adherent Slough. Wound #12 status is Open. Original cause of wound was Gradually Appeared. The wound is located on the Left,Proximal Upper Leg. The wound measures 5.5cm length x 2.7cm width x 0.1cm depth; 11.663cm^2 area and 1.166cm^3 volume. There is Fat Layer (Subcutaneous Tissue) exposed. There is no tunneling or undermining noted. There is [name] large amount of serous drainage noted. The wound margin is flat and intact. There is small (1-33%) pink granulation within the wound bed. There is [name] large (67-100%) amount of necrotic tissue within the wound bed including Adherent Slough. 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 There was [name] Excisional Skin/Subcutaneous Tissue Debridement with [name] total area of 14.85 sq cm performed by Ricard Dillon, MD. With the following instrument(s): Curette to remove Viable  and Non-Viable tissue/material. Material removed includes Subcutaneous Tissue and Slough and. [name] time out was conducted at 11:47, prior to the start of the procedure. [name] Moderate amount of bleeding was controlled with Silver Nitrate. The procedure was tolerated well with [name] pain level of 0 throughout and [name] pain level of 0 following the procedure. Post Debridement Measurements: 5.5cm length x 2.7cm width x 0.2cm depth; 2.333cm^3 volume. Post debridement Stage noted as Unstageable/Unclassified. Character of Wound/Ulcer Post Debridement is stable. Post procedure Diagnosis Wound #12: Same as Pre-Procedure Plan Wound Cleansing: Wound #10 Midline Sacrum: Cleanse wound with mild soap and water Wound #12 Left,Proximal Upper Leg: Cleanse wound with mild soap and water Primary Wound Dressing: Wound #10 Midline Sacrum: Dakins soaked gauze Wound #12 Left,Proximal Upper Leg: Dakins soaked gauze Secondary Dressing: ISAACK, PREBLE ([id number]) Wound #10 Midline Sacrum: ABD pad - secured with tape Dressing Change Frequency: Wound #10 Midline Sacrum: Change dressing every day. Wound #12 Left,Proximal Upper Leg: Change dressing every day. Follow-up Appointments: Wound #10 Midline Sacrum: Return Appointment in 2 weeks. Wound #12 Left,Proximal Upper Leg: Return Appointment in 2 weeks. Off-Loading: Wound #10 Midline Sacrum: Turn and reposition every 2 hours Other: - Keep pressure off of wounded area. Wound #12 Left,Proximal Upper Leg: Turn and reposition every 2 hours Other: - Keep pressure off  of wounded area. Additional Orders / Instructions: Wound #10 Midline Sacrum: Increase protein intake. Wound #12 Left,Proximal Upper Leg: Increase protein intake. Home Health: Wound #10 Midline Sacrum: Continue Home Health Visits - Homehealth- Please draw labs ordered below. Send results to the [place] fax: [phone]. Home Health Nurse may visit PRN to address patient s wound care  needs. FACE TO FACE ENCOUNTER: MEDICARE and MEDICAID PATIENTS: I certify that this patient is under my care and that I had [name] face-to-face encounter that meets the physician face-to-face encounter requirements with this patient on this date. The encounter with the patient was in whole or in part for the following MEDICAL CONDITION: (primary reason for [place]) MEDICAL NECESSITY: I certify, that based on my findings, NURSING services are [name] medically necessary home health service. HOME BOUND STATUS: I certify that my clinical findings support that this patient is homebound (i.e., Due to illness or injury, pt requires aid of supportive devices such as crutches, cane, wheelchairs, walkers, the use of special transportation or the assistance of another person to leave their place of residence. There is [name] normal inability to leave the home and doing so requires considerable and taxing effort. Other absences are for medical reasons / religious services and are infrequent or of short duration when for other reasons). If current dressing causes regression in wound condition, may D/C ordered dressing product/s and apply Normal Saline Moist Dressing daily until next [place] / Other MD appointment. [place] of regression in wound condition at [phone]. Please direct any NON-WOUND related issues/requests for orders to patient's Primary Care Physician Wound #12 Left,Proximal Upper Leg: [place] Visits - Homehealth- Please draw labs ordered below. Send results to the [place] fax: [phone]. Home Health Nurse may visit PRN to address patient s wound care needs. FACE TO FACE ENCOUNTER: MEDICARE and MEDICAID PATIENTS: I certify that this patient is under my care and that I had [name] face-to-face encounter that meets the physician face-to-face encounter requirements with this patient on this date. The encounter with the patient was in whole or in part for  the following MEDICAL CONDITION: (primary reason for [place]) MEDICAL NECESSITY: I certify, that based on my findings, NURSING services are [name] medically necessary home health service. HOME BOUND STATUS: I certify that my clinical findings support that this patient is homebound (i.e., Due to illness or injury, pt requires aid of supportive devices such as crutches, cane, wheelchairs, walkers, the use of special transportation or the assistance of another person to leave their place of residence. There is [name] normal inability to leave the home and doing so requires considerable and taxing effort. Other absences are for medical reasons / religious services and are infrequent or of short duration when for other reasons). If current dressing causes regression in wound condition, may D/C ordered dressing product/s and apply Normal Saline Moist Dressing daily until next [place] / Other MD appointment. [place] of regression in wound condition at [phone]. Please direct any NON-WOUND related issues/requests for orders to patient's Primary Care Physician Medications-please add to medication list.: Wound #10 Midline Sacrum: Other: - Dakins Solution or equivalent Wound #12 Left,Proximal Upper Leg: Other: - Dakins Solution or equivalent 1. We are using Dakin's wet-to-dry to the sacrum. The wound volume of this is much too large for any other wound dressing other than [name] wet-to- dry. 2. We are also using Dakin's to the left proximal upper leg. I  tried to debride as much of the nonviable surface today as possible 3. We will attempt to get lab work done where he goes for I presume [name] hemoglobin check 4. He did not get the x-ray did not seem particularly interested in this. I went over this with him again. We will see if he is compliant Electronic Signature(s) Signed: 05/07/2020 12:56:38 PM By: Linton Ham MD Entered By: Linton Ham on 05/06/2020 12:33:35 [name] ([id number]) -------------------------------------------------------------------------------- [facility] Details Patient Name: Betha Loa [name]. Date of Service: 05/06/2020 Medical Record Number: [id number] Patient Account Number: [id number] Date of Birth/Sex: 05/17/1946 (74 y.o. 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

## 2020-05-09 DIAGNOSIS — L89154 Pressure ulcer of sacral region, stage 4: Secondary | ICD-10-CM | POA: Diagnosis not present

## 2020-05-09 DIAGNOSIS — I10 Essential (primary) hypertension: Secondary | ICD-10-CM | POA: Diagnosis not present

## 2020-05-09 DIAGNOSIS — S24104S Unspecified injury at T11-T12 level of thoracic spinal cord, sequela: Secondary | ICD-10-CM | POA: Diagnosis not present

## 2020-05-09 DIAGNOSIS — D509 Iron deficiency anemia, unspecified: Secondary | ICD-10-CM | POA: Diagnosis not present

## 2020-05-09 DIAGNOSIS — Z79899 Other long term (current) drug therapy: Secondary | ICD-10-CM | POA: Diagnosis not present

## 2020-05-09 DIAGNOSIS — J309 Allergic rhinitis, unspecified: Secondary | ICD-10-CM | POA: Diagnosis not present

## 2020-05-09 DIAGNOSIS — N319 Neuromuscular dysfunction of bladder, unspecified: Secondary | ICD-10-CM | POA: Diagnosis not present

## 2020-05-09 DIAGNOSIS — N400 Enlarged prostate without lower urinary tract symptoms: Secondary | ICD-10-CM | POA: Diagnosis not present

## 2020-05-09 DIAGNOSIS — G8220 Paraplegia, unspecified: Secondary | ICD-10-CM | POA: Diagnosis not present

## 2020-05-11 ENCOUNTER — Other Ambulatory Visit: Payer: Self-pay

## 2020-05-11 ENCOUNTER — Inpatient Hospital Stay (HOSPITAL_BASED_OUTPATIENT_CLINIC_OR_DEPARTMENT_OTHER): Payer: Medicare HMO | Admitting: Oncology

## 2020-05-11 ENCOUNTER — Encounter: Payer: Self-pay | Admitting: Oncology

## 2020-05-11 ENCOUNTER — Inpatient Hospital Stay: Payer: Medicare HMO

## 2020-05-11 VITALS — BP 133/60 | HR 72 | Temp 97.0°F | Resp 18

## 2020-05-11 VITALS — BP 131/76 | HR 76 | Temp 97.1°F | Resp 18

## 2020-05-11 DIAGNOSIS — J309 Allergic rhinitis, unspecified: Secondary | ICD-10-CM | POA: Diagnosis not present

## 2020-05-11 DIAGNOSIS — D638 Anemia in other chronic diseases classified elsewhere: Secondary | ICD-10-CM | POA: Diagnosis not present

## 2020-05-11 DIAGNOSIS — N401 Enlarged prostate with lower urinary tract symptoms: Secondary | ICD-10-CM | POA: Diagnosis not present

## 2020-05-11 DIAGNOSIS — N319 Neuromuscular dysfunction of bladder, unspecified: Secondary | ICD-10-CM | POA: Diagnosis not present

## 2020-05-11 DIAGNOSIS — N39498 Other specified urinary incontinence: Secondary | ICD-10-CM | POA: Diagnosis not present

## 2020-05-11 DIAGNOSIS — D508 Other iron deficiency anemias: Secondary | ICD-10-CM | POA: Diagnosis not present

## 2020-05-11 DIAGNOSIS — D509 Iron deficiency anemia, unspecified: Secondary | ICD-10-CM | POA: Diagnosis not present

## 2020-05-11 DIAGNOSIS — G8220 Paraplegia, unspecified: Secondary | ICD-10-CM | POA: Diagnosis not present

## 2020-05-11 DIAGNOSIS — I10 Essential (primary) hypertension: Secondary | ICD-10-CM | POA: Diagnosis not present

## 2020-05-11 DIAGNOSIS — L89154 Pressure ulcer of sacral region, stage 4: Secondary | ICD-10-CM | POA: Diagnosis not present

## 2020-05-11 DIAGNOSIS — S24104S Unspecified injury at T11-T12 level of thoracic spinal cord, sequela: Secondary | ICD-10-CM | POA: Diagnosis not present

## 2020-05-11 MED ORDER — SODIUM CHLORIDE 0.9 % IV SOLN
Freq: Once | INTRAVENOUS | Status: AC
  Filled 2020-05-11: qty 250

## 2020-05-11 MED ORDER — IRON SUCROSE 20 MG/ML IV SOLN
200.0000 mg | Freq: Once | INTRAVENOUS | Status: AC
  Administered 2020-05-11: 200 mg via INTRAVENOUS
  Filled 2020-05-11: qty 10

## 2020-05-11 MED ORDER — SODIUM CHLORIDE 0.9 % IV SOLN: 200.0000 mg | Freq: Once | INTRAVENOUS | Status: DC

## 2020-05-11 NOTE — Progress Notes (Signed)
Pt received venofer infusion in clinic today. Tolerated well. VSS at discharge.

## 2020-05-11 NOTE — Progress Notes (Addendum)
Hematology/Oncology Follow Up Note [facility] Telephone:([phone] [phone]  CONSULT NOTE Patient Care Team: [name], MD as PCP - General (Family Medicine)  CHIEF COMPLAINTS/PURPOSE OF CONSULTATION:   anemia   HISTORY OF PRESENTING ILLNESS:  Bradley [name] 74 y.o.  male with PMH listed as below who was referred to me for evaluation of iron deficiency anemia.  Patient is paraplegic, also has chronic pressure ulcer, recently just had colostomy. He self catheterizes. Patient feels that his energy levels have been the same. He always has mild fatigue.  He has BPH for which he take finasteride. He has not see his urologist for a while.  He was seen by [facility] for elevated ESR and his chronic hand joint tenderness. He was not considered to have rheumatoid arthritis.   INTERVAL HISTORY Patient presents to follow-up for anemia management.  .  Hematuria was thought to be UTI. Patient has received IV Venofer treatments during the interval.  He tolerates well.  ROS:  Review of Systems  Constitutional: Positive for fatigue.  HENT:  Negative.  Negative for lump/mass and mouth sores.   Eyes: Negative.  Negative for eye problems.  Respiratory: Negative.  Negative for chest tightness and cough.   Cardiovascular: Negative.  Negative for chest pain.  Gastrointestinal: Negative.  Negative for abdominal distention and abdominal pain.       Colostomy  Endocrine: Negative.  Negative for hot flashes.  Genitourinary: Negative for hematuria.        Self catherize  Musculoskeletal: Positive for arthralgias. Negative for back pain.       Paraplegic  Skin: Positive for wound. Negative for itching and rash.       Chronic pressure ulcer  Neurological: Negative.  Negative for dizziness.  Hematological: Negative.  Negative for adenopathy.  Psychiatric/Behavioral: Negative.  The patient is not nervous/anxious.     MEDICAL HISTORY:  Past Medical History:    Diagnosis Date  . Anemia   . Arthritis   . Paralysis of both lower limbs ([place])   . Sacral decubitus ulcer     SURGICAL HISTORY: Past Surgical History:  Procedure Laterality Date  . COLONOSCOPY  06/06/2014  . INCISION AND DRAINAGE OF WOUND N/A 06/04/2018   Procedure: IRRIGATION AND DEBRIDEMENT WOUND;  Surgeon: Olean Ree, MD;  Location: [facility] ORS;  Service: General;  Laterality: N/A;  . TONSILLECTOMY      SOCIAL HISTORY: Social History   Socioeconomic History  . Marital status: Married    Spouse name: Not on file  . Number of children: Not on file  . Years of education: Not on file  . Highest education level: Not on file  Occupational History  . Not on file  Tobacco Use  . Smoking status: Former Smoker    Packs/day: 0.50    Years: 5.00    Pack years: 2.50    Quit date: 1972    Years since quitting: 49.9  . Smokeless tobacco: Never Used  Vaping Use  . Vaping Use: Never used  Substance and Sexual Activity  . Alcohol use: No  . Drug use: No  . Sexual activity: Not on file  Other Topics Concern  . Not on file  Social History Narrative  . Not on file   Social Determinants of Health   Financial Resource Strain:   . Difficulty of Paying Living Expenses: Not on file  Food Insecurity:   . Worried About Charity fundraiser in the Last Year: Not on file  .  Ran Out of Food in the Last Year: Not on file  Transportation Needs:   . Lack of Transportation (Medical): Not on file  . Lack of Transportation (Non-Medical): Not on file  Physical Activity:   . Days of Exercise per Week: Not on file  . Minutes of Exercise per Session: Not on file  Stress:   . Feeling of Stress : Not on file  Social Connections:   . Frequency of Communication with Friends and Family: Not on file  . Frequency of Social Gatherings with Friends and Family: Not on file  . Attends Religious Services: Not on file  . Active Member of Clubs or Organizations: Not on file  . Attends Theatre manager Meetings: Not on file  . Marital Status: Not on file  Intimate Partner Violence:   . Fear of Current or Ex-Partner: Not on file  . Emotionally Abused: Not on file  . Physically Abused: Not on file  . Sexually Abused: Not on file    FAMILY HISTORY: Family History  Problem Relation Age of Onset  . Breast cancer Mother     ALLERGIES:  has No Known Allergies.  MEDICATIONS:  Current Outpatient Medications  Medication Sig Dispense Refill  . acetaminophen (TYLENOL) 500 MG tablet Take 1,000 mg by mouth 3 (three) times daily.     Marland Kitchen amLODipine (NORVASC) 5 MG tablet Take 5 mg by mouth daily.    . baclofen (LIORESAL) 10 MG tablet Take 20 mg by mouth 3 (three) times daily.    . finasteride (PROSCAR) 5 MG tablet Take 5 mg by mouth daily.    . Multiple Vitamin (MULTI-VITAMINS) TABS Take 1 tablet by mouth daily.     Marland Kitchen nystatin cream (MYCOSTATIN) Apply 1 application topically 2 (two) times daily. 30 g 0  . polyethylene glycol powder (GLYCOLAX/MIRALAX) powder Take 17 g by mouth daily as needed for mild constipation.     . Testosterone 20.25 MG/1.25GM (1.62%) GEL Place 2 Pump onto the skin daily. 150 g 3  . vitamin C (ASCORBIC ACID) 500 MG tablet Take 500 mg by mouth 2 (two) times daily.    Marland Kitchen tetracycline (SUMYCIN) 500 MG capsule Take 1 capsule (500 mg total) by mouth 2 (two) times daily. (Patient not taking: Reported on 05/11/2020) 14 capsule 0   No current facility-administered medications for this visit.      Marland Kitchen  PHYSICAL EXAMINATION: ECOG PERFORMANCE STATUS: 2 - Symptomatic, <50% confined to bed Vitals:   05/11/20 1325  BP: 131/76  Pulse: 76  Resp: 18  Temp: (!) 97.1 F (36.2 C)   Filed Weights   Physical Exam Constitutional:      General: He is not in acute distress.    Appearance: He is not diaphoretic.  HENT:     Head: Normocephalic and atraumatic.     Nose: Nose normal.     Mouth/Throat:     Pharynx: No oropharyngeal exudate.  Eyes:     General: No scleral  icterus.    Pupils: Pupils are equal, round, and reactive to light.  Cardiovascular:     Rate and Rhythm: Normal rate and regular rhythm.     Heart sounds: No murmur heard.   Pulmonary:     Effort: Pulmonary effort is normal. No respiratory distress.     Breath sounds: No rales.  Chest:     Chest wall: No tenderness.  Abdominal:     General: There is no distension.     Palpations: Abdomen is soft.  Tenderness: There is no abdominal tenderness.  Musculoskeletal:     Cervical back: Normal range of motion and neck supple.     Comments: Bilateral ulnar deviation of metacarpophalangeal joints and joint tenderness.    Skin:    General: Skin is warm and dry.     Findings: No erythema.  Neurological:     Mental Status: He is alert and oriented to person, place, and time.     Cranial Nerves: No cranial nerve deficit.     Motor: No abnormal muscle tone.     Coordination: Coordination normal.     Comments:  paraplegic  Psychiatric:        Mood and Affect: Affect normal.       LABORATORY DATA:  I have reviewed the data as listed Lab Results  Component Value Date   WBC 8.3 05/08/2020   HGB 9.7 (L) 05/08/2020   HCT 33.6 (L) 05/08/2020   MCV 69.4 (L) 05/08/2020   PLT 374 05/08/2020   Recent Labs    06/03/19 1010 06/03/19 1010 06/04/19 0417 06/04/19 0417 06/05/19 0655 04/01/20 1335 05/08/20 1103  NA 140   < > 140  --   --  139 137  K 3.1*   < > 3.5  --   --  3.3* 4.0  CL 106   < > 108  --   --  100 101  CO2 26   < > 24  --   --  29 31  GLUCOSE 103*   < > 98  --   --  103* 81  BUN 6*   < > 6*  --   --  6* 10  CREATININE 0.43*   < > 0.49*   < > 0.35* 0.45* 0.46*  CALCIUM 7.9*   < > 8.0*  --   --  7.9* 8.6*  GFRNONAA >60   < > >60   < > >60 >60 >60  GFRAA >60  --  >60  --  >60  --   --   PROT 6.6  --   --   --   --  7.2 8.3*  ALBUMIN 2.3*  --   --   --   --  2.3* 2.8*  AST 19  --   --   --   --  14* 15  ALT 10  --   --   --   --  7 9  ALKPHOS 55  --   --   --   --   48 60  BILITOT 0.7  --   --   --   --  0.6 0.5   < > = values in this interval not displayed.   Iron/TIBC/Ferritin/ %Sat    Component Value Date/Time   IRON 22 (L) 05/08/2020 1103   TIBC 227 (L) 05/08/2020 1103   FERRITIN 238 05/08/2020 1103   IRONPCTSAT 10 (L) 05/08/2020 1103   Negative stool occult.   ASSESSMENT & PLAN:  1. Other iron deficiency anemia   2. Anemia in other chronic diseases classified elsewhere    #Anemia, Multifactorial, likely secondary to iron deficiency anemia and chronic inflammation. Labs reviewed and discussed with patient Ferritin-238 iron saturation 10%, TIBC 227. CRP is elevated at 7.1. Hemoglobin 9.7 which has significantly improved compared to 7.6. I recommend to proceed with additional IV Venofer weekly x2. Patient will follow up with me in 4 to 6 weeks for evaluation of additional need of IV Venofer   # Thrombocytosis  likely due to iron deficiency.  Has resolved  All questions were answered. The patient knows to call the clinic with any problems questions or concerns.  Return of visit: 5-6 weeks for re-evaluation and repeat labs  Earlie Server, MD, PhD Hematology Oncology [facility] at [facility] Pager- [phone] 05/11/2020

## 2020-05-11 NOTE — Telephone Encounter (Signed)
Appt made for Thursday 05/14/20 w Larene Beach.

## 2020-05-11 NOTE — Progress Notes (Signed)
Pt here for follow up. No new concerns voiced.   

## 2020-05-13 DIAGNOSIS — S24104S Unspecified injury at T11-T12 level of thoracic spinal cord, sequela: Secondary | ICD-10-CM | POA: Diagnosis not present

## 2020-05-13 DIAGNOSIS — G8220 Paraplegia, unspecified: Secondary | ICD-10-CM | POA: Diagnosis not present

## 2020-05-13 DIAGNOSIS — D509 Iron deficiency anemia, unspecified: Secondary | ICD-10-CM | POA: Diagnosis not present

## 2020-05-13 DIAGNOSIS — J309 Allergic rhinitis, unspecified: Secondary | ICD-10-CM | POA: Diagnosis not present

## 2020-05-13 DIAGNOSIS — I10 Essential (primary) hypertension: Secondary | ICD-10-CM | POA: Diagnosis not present

## 2020-05-13 DIAGNOSIS — N319 Neuromuscular dysfunction of bladder, unspecified: Secondary | ICD-10-CM | POA: Diagnosis not present

## 2020-05-13 DIAGNOSIS — L89154 Pressure ulcer of sacral region, stage 4: Secondary | ICD-10-CM | POA: Diagnosis not present

## 2020-05-13 DIAGNOSIS — N39498 Other specified urinary incontinence: Secondary | ICD-10-CM | POA: Diagnosis not present

## 2020-05-13 DIAGNOSIS — N401 Enlarged prostate with lower urinary tract symptoms: Secondary | ICD-10-CM | POA: Diagnosis not present
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
## 2020-05-14 ENCOUNTER — Ambulatory Visit: Payer: Self-pay | Admitting: Urology

## 2020-05-15 DIAGNOSIS — D509 Iron deficiency anemia, unspecified: Secondary | ICD-10-CM | POA: Diagnosis not present

## 2020-05-15 DIAGNOSIS — G8220 Paraplegia, unspecified: Secondary | ICD-10-CM | POA: Diagnosis not present

## 2020-05-15 DIAGNOSIS — N319 Neuromuscular dysfunction of bladder, unspecified: Secondary | ICD-10-CM | POA: Diagnosis not present

## 2020-05-15 DIAGNOSIS — L89154 Pressure ulcer of sacral region, stage 4: Secondary | ICD-10-CM | POA: Diagnosis not present

## 2020-05-15 DIAGNOSIS — S24104S Unspecified injury at T11-T12 level of thoracic spinal cord, sequela: Secondary | ICD-10-CM | POA: Diagnosis not present

## 2020-05-15 DIAGNOSIS — I10 Essential (primary) hypertension: Secondary | ICD-10-CM | POA: Diagnosis not present

## 2020-05-15 DIAGNOSIS — J309 Allergic rhinitis, unspecified: Secondary | ICD-10-CM | POA: Diagnosis not present

## 2020-05-15 DIAGNOSIS — N39498 Other specified urinary incontinence: Secondary | ICD-10-CM | POA: Diagnosis not present

## 2020-05-15 DIAGNOSIS — N401 Enlarged prostate with lower urinary tract symptoms: Secondary | ICD-10-CM | POA: Diagnosis not present

## 2020-05-18 DIAGNOSIS — N39498 Other specified urinary incontinence: Secondary | ICD-10-CM | POA: Diagnosis not present

## 2020-05-18 DIAGNOSIS — G8220 Paraplegia, unspecified: Secondary | ICD-10-CM | POA: Diagnosis not present

## 2020-05-18 DIAGNOSIS — N319 Neuromuscular dysfunction of bladder, unspecified: Secondary | ICD-10-CM | POA: Diagnosis not present

## 2020-05-18 DIAGNOSIS — L89154 Pressure ulcer of sacral region, stage 4: Secondary | ICD-10-CM | POA: Diagnosis not present

## 2020-05-18 DIAGNOSIS — D509 Iron deficiency anemia, unspecified: Secondary | ICD-10-CM | POA: Diagnosis not present

## 2020-05-18 DIAGNOSIS — I10 Essential (primary) hypertension: Secondary | ICD-10-CM | POA: Diagnosis not present

## 2020-05-18 DIAGNOSIS — S24104S Unspecified injury at T11-T12 level of thoracic spinal cord, sequela: Secondary | ICD-10-CM | POA: Diagnosis not present

## 2020-05-18 DIAGNOSIS — J309 Allergic rhinitis, unspecified: Secondary | ICD-10-CM | POA: Diagnosis not present

## 2020-05-18 DIAGNOSIS — N401 Enlarged prostate with lower urinary tract symptoms: Secondary | ICD-10-CM | POA: Diagnosis not present

## 2020-05-20 ENCOUNTER — Encounter: Payer: Medicare HMO | Admitting: Internal Medicine

## 2020-05-20 ENCOUNTER — Other Ambulatory Visit: Payer: Self-pay

## 2020-05-20 DIAGNOSIS — L89154 Pressure ulcer of sacral region, stage 4: Secondary | ICD-10-CM | POA: Diagnosis not present

## 2020-05-20 DIAGNOSIS — I10 Essential (primary) hypertension: Secondary | ICD-10-CM | POA: Diagnosis not present

## 2020-05-20 DIAGNOSIS — G8220 Paraplegia, unspecified: Secondary | ICD-10-CM | POA: Diagnosis not present

## 2020-05-20 DIAGNOSIS — D649 Anemia, unspecified: Secondary | ICD-10-CM | POA: Diagnosis not present

## 2020-05-20 DIAGNOSIS — M069 Rheumatoid arthritis, unspecified: Secondary | ICD-10-CM | POA: Diagnosis not present

## 2020-05-20 DIAGNOSIS — L97129 Non-pressure chronic ulcer of left thigh with unspecified severity: Secondary | ICD-10-CM | POA: Diagnosis not present

## 2020-05-20 DIAGNOSIS — L89153 Pressure ulcer of sacral region, stage 3: Secondary | ICD-10-CM | POA: Diagnosis not present

## 2020-05-20 DIAGNOSIS — Z933 Colostomy status: Secondary | ICD-10-CM | POA: Diagnosis not present

## 2020-05-21 DIAGNOSIS — I10 Essential (primary) hypertension: Secondary | ICD-10-CM | POA: Diagnosis not present

## 2020-05-21 DIAGNOSIS — G8220 Paraplegia, unspecified: Secondary | ICD-10-CM | POA: Diagnosis not present

## 2020-05-21 DIAGNOSIS — N319 Neuromuscular dysfunction of bladder, unspecified: Secondary | ICD-10-CM | POA: Diagnosis not present

## 2020-05-21 DIAGNOSIS — S24104S Unspecified injury at T11-T12 level of thoracic spinal cord, sequela: Secondary | ICD-10-CM | POA: Diagnosis not present

## 2020-05-21 DIAGNOSIS — L89154 Pressure ulcer of sacral region, stage 4: Secondary | ICD-10-CM | POA: Diagnosis not present

## 2020-05-21 DIAGNOSIS — J309 Allergic rhinitis, unspecified: Secondary | ICD-10-CM | POA: Diagnosis not present

## 2020-05-21 DIAGNOSIS — N39498 Other specified urinary incontinence: Secondary | ICD-10-CM | POA: Diagnosis not present

## 2020-05-21 DIAGNOSIS — N401 Enlarged prostate with lower urinary tract symptoms: Secondary | ICD-10-CM | POA: Diagnosis not present

## 2020-05-21 DIAGNOSIS — D509 Iron deficiency anemia, unspecified: Secondary | ICD-10-CM | POA: Diagnosis not present

## 2020-05-21 NOTE — Progress Notes (Signed)
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 days this will also provide some problem with regards to the vac seal 07/26/16; the patient never did obtain a [place] wound VAC as he could not afford the  $200 per month co-pay we have been using Hydrofera Blue now for 6 weeks or so. No major change in this wound at all. He is still not interested in the concept of plastic surgery. There changing the dressing every second day 08/09/16; the patient arrives with a wound precisely in the same situation. In keeping with the plan I outlined last time extensive debridement with an open curet the surface of this is not completely viable. Still has some degree of surrounding thick skin and subcutaneous tissue. No evidence of infection. Once again I have had a conversation with him about plastic surgery, he is simply not interested. 08/23/16; wound is really no different. Thick circumferential skin and subcutaneous tissue around the  wound edge which is a lot better from debridement we did earlier in the year. The surface of the wound looks viable however with a curet there is definitely a gritty surface to this. We use Medihoney for a while, he could not afford Santyl. I don't think we could get a supply of Iodoflex. He talks a little more positively about the concept of plastic surgery which I've gone over with him today 08/31/16;; patient arrives in clinic today with the wound surface really no different there is no changes in dimensions. I debrided today surface on the left upper side of this wound aggressively week ago there is no real change here no evidence of epithelialization. The problem with debridement in the clinic is that he believes from this very liberally. We have been using Sorbact. 09/21/16; absolutely no change in the appearance or measurements of this wound. More recently I've been debrided in this aggressively and using sorbact to see if we could get to a better wound surface. Although this visually looks satisfactory, debridement reveals a very gritty surface to this. However even with this debridement and removal of thick nonviable skin and subcutaneous tissue from around the large amount of  the circumference of this wound we have made absolutely no progress. This may be an offloading issue I'm just not completely certain. It has 2 close proximity in its inferior aspect to consider negative pressure therapy 10/26/16; READMISSION This patient called our clinic yesterday to report an odor in his wound. He had been to see plastic surgery at [facility] at our request after his last visit on 09/21/16; we have been seeing him for several months with a large stage III wound. He had been sent to general surgery for consideration of a colostomy, that appointment was not until mid June He comes in today with a temperature of 101. He is reporting an odor in the wound since last weekend. 01/10/17 Readmission: 01/10/17 On evaluation today it is noted that patient has been seen by plastic surgery at [facility] since he was last evaluated here. They did discuss with him the possibility of a flap according to the notes but unfortunately at this point he was not quite ready to proceed with surgery and instead wanted to give the Wound VAC a try. In the hospital they were able to get a good seal on the Wound VAC. Unfortunately since that time they have been having trouble in regard to his current home health company keeping a simple on the Wound VAC. He would like to switch to a different home health company. With that being said it sounds as if the problem is that his wound VAC is not feeling at the lower portion of his back and he tells me that he can take some of the clear plastic and put over that area when the sill breaks and it will correct it for time. He has no discomfort or pain which is good news. He has been treated with IV vancomycin since he was last seen here and has an appointment with a infectious disease specialist in two days on 01/12/17. Otherwise he was transferred back to [place] for continuing to monitor and manage is wound as she progresses with a Wound VAC for the time being. 01/17/17 on evaluation today  patient continues to show evidence of slight improvement with the Wound VAC fortunately there's no evidence of infection or otherwise worsening condition in general. Nonetheless we were unable to get him switch to [facility] in regard  to home help from his current company. I'm not sure the reasoning behind but for some reason he was not accepted as a patient with him. Continue to apply the Wound VAC which does still show that some maceration around the wound edges but the wound measurements were slightly improved. No fevers, chills, nausea, or vomiting noted at this time. 02/14/17; this patient I have not seen in 5 months although he has been readmitted to our clinic seen by our physician assistant Jeri Cos twice in early August. I have looked through [facility] notes care everywhere. The patient saw plastic surgery in May [Dr. Bhatt}. The patient was sent to general surgery and ultimately had a colostomy placed. On 11/29/16. This was after he was admitted to [facility] sometime in May. An MRI of the pelvis on 5/23 showed osteomyelitis of the coccyx. An attempt was made to drain fluid that was not successful. He was treated with empiric broad-spectrum antibiotics VAC/cefepime/Flagyl starting on 11/02/16 with plans for a 6 week course. According to their notes he was sent to a nursing home. Was last seen by Dr. Myriam Jacobson of plastic surgery on 12/28/16. The first part of the note is a long dissertation about the difficulties finding adequate patients for flap closure of pressure ulcers. At that time the wound was noted to be stage IV based I think on underlying infection no exposed bone and healthy granulation tissue. 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 He was last seen by infectious disease 01/12/17 A Dr. Uvaldo Rising. His note says that Mr. Staples was not interested in a flap closure for referring a trial of the wound VAC. As previously anticipated the wound VAC could not  be maintained as an outpatient in the community. He is now using something similar to a Dakin's wet to dry recommended by [facility] VASHE solution. He is placing this twice a day himself. This is almost s hopeless setting in terms of heeling 02/28/17; he is using a Dakin's wet to dry. Most of the wound surface looks satisfactory however the deeper area over his coccyx now has exposed bone I'm not sure if I noted this last week. 03/21/17; patient is usingVASHE solution wet to dry which I gather is a variation on Dakin solution. He has home health changing this 3 times a week the other days he does this himself. His appointment with plastic surgery 04/18/17; patient continues to use a variant of Dakin solution I believe. His wound continues to have a clean viable surface. The 2 areas of exposed bone in the center of this wound had closed over. He has an appointment with plastic surgery on December 5 at which time I hope that there'll be a plan for myocutaneous flap closure In looking through [facility] link I couldn't find any more plastic surgery appointments. I did come across the fact that he is been followed by hematology for a microcytic hypochromic anemia. He had a reasonably normal looking hemoglobin electrophoresis. His iron level was 10 and according to the patient he is going for IV iron infusions starting tomorrow. He had a sedimentation rate of 74. More problematically from a pure wound care point of view his albumin was 2.7 earlier this month 05/17/17; this is a patient I follow monthly. He has a large now stage IV wound over his bilateral buttocks with close proximity to his anal JIOVANY, SCHEFFEL A. ([id number]) opening. More recently he has developed a large area with exposed bone in  the center of this probably secondary to the underlying osteomyelitis E had in the summer. He also follows with Dr. Myriam Jacobson at [facility] who is plastic surgery. He had an appointment earlier this month and according to the  patient Dr. Myriam Jacobson does not want to proceed with any attempted flap closure. Although I do not have current access to her note in care everywhere this is likely due to exposed bone. Again according to the patient they did a bone biopsy. He is still using a variant of Dakin solution changing twice a day. He has home health. The patient is not able to give me a firm answer about how long he spends on this in his wheelchair The patient also states that Dr. Myriam Jacobson wanted to reconsider a wound VAC. I really don't see this as a viable option at least not in the outpatient setting. The wound itself is frankly to close to his anal opening to maintain a seal. The last time we tried to do this home health was unable to manage it. It might be possible to maintain a wound VAC in this setting outside of the home such as a skilled nursing facility or an [place] however I am doubtful about this even in that setting **** READMISSION 09/21/17-He is here for evaluation of stage IV sacral ulcer. Since his last evaluation here in December he has completed treatment for sacral osteomyelitis. He was at [facility] for IV therapy and NPWT dressing changes. He was discharged, with home health services, in February. He admits that while in the skilled facility he had "80%" success with maintaining dressing, since discharge he has had approximately "40%" success with maintaining wound VAC dressing. We discussed at length that this is not a safe or recommended option. We will apply Dakin's wet to dry dressing daily and he will follow-up next week. He is accompanied today by his sister who is willing to assist in dressing changes; they will discuss the social issue as he feels he is capable of changing dressing daily when home health is not able. 09/28/17-He is here in follow-up evaluation for stage IV sacral pressure ulcer. He has been using the Dakin's wet-to-dry daily; he continues with home health. He is not accompanied  by anyone at this visit. He will follow up in two weeks per his request/preference. 10/12/17 on evaluation today patient appears to be doing very well. The Dakin solution went to dry packings do seem to be helping him as far as the sacral wound is concerned I'm not seeing anything that has me more concerned as far as infection or otherwise is concerned. Overall I'm pleased with the appearance of the wound. 10/26/17-He is here in follow up evaluation for a stage IV sacral ulcer. He continues with daily Dakin's wet-to-dry. He is voicing no complaints or concerns. He will follow-up in 2 weeks 11/16/17-He is here for follow up evaluation for a palliative stage IV sacral pressure ulcer. We will continue with Dakin's wet-to-dry. He will follow- up in 4 weeks. He is expressing concern/complaint regarding new bed that has arrived, stating he is unable to manipulate/maneuver it due to the bed crank being at the foot of the bed. 12/14/17-He is here for evaluation for palliative stage IV sacral ulcer. He is voicing no complaints or concerns. We will continue with one-to-one ratio of saline and dakins. He will follow-up in 3 weeks 01/04/18-He is here in follow-up evaluation for palliative stage IV sacral ulcer. He is inquiring about reinstating the negative pressure  wound therapy. We discussed at length that the negative pressure wound therapy in the home setting has not been successful for him repeatedly with loss of cereal and unavailability of 24/7 help; reminded him that home health is not available 24/7 when loss of seal occurs. He does verbalize understanding to this and does not pursue. We also discussed the palliative nature of this ulcer (given no significant change/improvement in measurement/appearance, not a candidate for muscle flap per plastic surgery, and continued independent living) and that the goal is for maintenance, decrease in infection and minimizing/avoiding deterioration given that he is  independent in his care, does not have home health and requires daily dressing changes secondary to drainage amount. He is inquiring about a wound clinic in [facility], I have informed him that I am unfamiliar with that clinic but that he is encouraged to seek another opinion if that is his desire. We will continue with dakins and he will follow up in three weeks 01/25/18-He is here in follow-up evaluation for palliative stage IV sacral ulcer. He continues with Dakin's/saline 1:1 mixture wet to dry dressing changes. He states he has an appointment at [facility] on 9/17 for evaluation of surgical intervention/closure of the sacral ulcer. He will follow-up here in 4 weeks Readmission: 07/16/2019 upon evaluation today patient appears to be doing really about the same as when he was previously seen here in the wound care center. He most recently was a patient of [place] back when she was still working here in the center but had been referred to [facility] for consideration of a flap. With that being said the surgeon there at [facility] stated that this was too large for her flap and they have been attempting to get this smaller in order to be able to proceed with a flap. Nonetheless unfortunately he has had a cycle of going back and forth between the osteomyelitis flaring and then sent him back and then making a little bit of progress only to be sent back again. It sounds like most recently they have been using a Iodoflex type dressing at this point which does not seem to have done any harm by any means. With that being said this wound seems to be quite large for using Iodoflex throughout and subsequently I think he may do much better with the use of Vioxx moistened gauze which would be safe for the new tissue growing and also keep the wound quite nice and clean. The patient is not opposed to this he in fact states that his home health nurse had mentioned this as a possibility as well. 07/23/2019 upon evaluation today patient actually  appears to be doing very well in regard to his sacral wound. In fact this is much healthier the measurements not terribly different but again with a wound like this at home necessarily expect a a lot of change as far as the overall measurements are concerned in just 1 week's time. This is going be a much longer term process at this point. With that being said I do think that he is very healthy appearing as far as the base of the wound is concerned. 08/06/2019 upon evaluation today patient actually appears to be doing about the same with regard to his wound to be honest. There is really not a significant improvement overall based on what I am seeing today. Fortunately there is no signs of significant systemic infection. No fevers, chills, nausea, vomiting, or diarrhea. With that being said there is odor to the drainage  from the wound and subsequently also what appears to be increased drainage based on what we are seeing today as well as what his home health nurse called [place] about that she was concerned with as well. 08/13/2019 upon evaluation today patient appears to be doing about the same at this point with regard to his wound although I think the dressing may be a little less drainage wise compared to what it was previous. Fortunately there is no signs of active infection at this time. No fevers, chills, nausea, vomiting, or diarrhea. He has been taking the antibiotic for only 3 days. 08/23/2019 upon evaluation today patient appears to be doing not nearly as well with regard to his wound. In general really not making a lot of progress which is unfortunate. There does not appear to be any signs of active infection at this time. No fevers, chills, nausea, vomiting, or diarrhea. With that being said the patient unfortunately does seem to be experiencing continued epiboly around the edges of the wound as well as significant scar tissue he has been in the majority of his day sitting in his chair which is likely  a big reason for all of this. Fortunately there is no signs of active infection at this time. No fevers, chills, nausea, vomiting, or diarrhea. 08/29/2019 upon evaluation today patient's wound actually appears to be showing some signs of improvement. The region of few areas of new skin growth around the edges of the wound even where he has some of the epiboly. This is actually good news and I think that he has been very aggressive and offloading over the past week since we showed him the pictures of his wound last week. There still may be a chance that he is TRES, GRZYWACZ. ([id number]) going require some sharp debridement to clear away some of these edges but to be honest I think that is can be quite an undertaking. The main reason is that he has a lot of thickened scar tissue and it is good to bleed quite significantly in my opinion. The I think if organ to do that we may even need to have a portable or disposable cautery in order to make sure that we are able to get the area completely sealed up as far as bleeding is concerned. I do have one that we can utilize. However being that the wound looks so much better right now would like to give this 2 weeks to see how things stand and look at that point before making any additional recommendations. 09/12/2019 upon evaluation today patient appears to be making some progress as far as offloading is concerned. He still has a substantial wound but nonetheless he tells me he is off loading much more aggressively at this point. This is obviously good news. No fevers, chills, nausea, vomiting, or diarrhea. 4/22; I have not seen this patient in quite some time. No major change in the condition of the wound or its wound volume. Surrounding maceration of the skin around the edges of the wound. The wound is fairly substantial. To close to the anal opening to consider a wound VAC. He had extensive trials of plastic surgery at [facility] which really does not  result in any improvement. His wound bed is however clean 10/10/2019 upon evaluation today patient appears to be doing well with regard to the wound in the sacral region. He has been tolerating the dressing changes without complication. Fortunately there is no signs of infection and he actually  has some epithelial growth noted as well. He tells me has been trying to continue to offload this area which is excellent that is what he needs to do most as far as what he can have in his control. Otherwise I feel like the collagen with a saline moistened gauze behind has been beneficial for him. 5/20; collagen and normal saline wet-to-dry. Although the tissue when this large sacral wound looks reasonable there is been absolutely no benefit to the overall closure. He has macerated skin around this. He tells me that he spends a large part of the day up in the wheelchair. He still has home health 11/07/2019 upon evaluation today patient appears to be doing well with regard to his wounds at this point. Fortunately there is no signs of active infection at this time. Overall I feel like his sacral wound in general appears to be doing quite nicely. I am very pleased with overall how things are progressing and I feel like the patient is making excellent progress towards resolution. With that being said he still has a long ways to go before we expect to see this completely healed. 11/21/2019 upon evaluation today patient appears to be doing about the same in regard to his sacral wound. Unfortunately he is really not making much in the way of improvement when I questioned him about how much time he is really spending sitting on his bottom he tells me that the majority of his day is mainly in that position though he does spend some time in bed. It does not sound like it is enough however neither does it look like it is enough. 12/24/2019 upon evaluation today patient appears to be doing really about the same there is no  significant improvement overall in his wound bed. Last time he was seen here we did have a discussion about the possibility of seeing if I can get a surgeon to evaluate and potentially clear away some of the thickened edges of the wound in order to allow this wound to potentially have a chance of closing with a wound VAC. Right now we could not even get a wound VAC to seal with some of the rolled and thickened edges. With that being said elected to the patient to think about this to discuss today. At this point the patient does want to see if I could make the referral to potentially see if the surgeon would be able to perform this and subsequently he agrees to go to a skilled nursing facility following if they are able to do this in order to allow for a wound VAC and to get this wound to heal more effectively and quickly. In my opinion the only way that I would recommend that he see the surgeon to clear away the edges would be if he is also in agreement with going to skilled nursing facility as I know he cannot appropriately offload at home to take care of this and that setting. 01/07/2020 on evaluation today patient appears to be doing about the same in regard to his wound. There is no signs of active infection at this time which is great news. All in all he is really doing about the same. We have made referral to surgery to see if they can potentially perform debridement to clear away some of the scarred edging of the wound so we could potentially see about a wound VAC to try to help this heal more efficiently. Again we have discussed that if this occurs  he would be in a skilled nursing facility following or release would need to be in order to appropriately offload. Nonetheless we are still in the process of working that referral. 01/21/2020 upon evaluation today patient appears to be doing about the same in regard to his sacral wound. He did see Dr. Lysle Pearl Dr. Ines Bloomer feeling was that there was not  anything surgically he could offer at this point as he did not feel like that a wound VAC would likely be appropriate for the patient. With that being said he did recommend potential for trying to get the patient into a skilled nursing facility as an outpatient and they will get me working on that as well. Fortunately there is no signs of active infection at this time. 11/10; this is a patient that has not been seen here in quite a bit of time while we were in our alternative venue. As well I have not seen this patient in probably over 2 years. As I remember things with him he has had this pressure ulcer encompassing his lower sacrum and coccyx and the surrounding buttocks for a prolonged period of time. When he first came here the surface looked fairly good and the wound was smaller we attempted a wound VAC but we could never keep this in place. Because of nonresponse of the wound to topical dressings we referred him to [facility] plastic surgery where preparation was being made for flap closure. Intact he had a colostomy however after that he was deemed to have 2 large wound for flap closure. He did have osteomyelitis in the coccyx area in 2019. He is using wet-to-dry he has home health. They are managing his ostomy supplies as well. The wound is substantially larger than what I remember. Versus last time he was here to wound months or so ago he has an unstageable wound on the left upper thigh posteriorly. He tells me he is in his chair quite a bit especially recently 12/1; patient comes back to clinic. He did not get the lab work done apparently home health would not do this they referred him to the lab where he is apparently having a CBC checked because he is on iron infusions. We will try to get the lab work I ordered done there. He also did not get the x-ray he says he does not remember the discussion. The issue is that this large wound has probing areas to bone. He may have underlying  osteomyelitis again he was treated for this previously I think in 2019 12/15; the patient did have lab work done. Notable for iron deficiency anemia with a hemoglobin of 9.7 although he is following with hematology for this his CRP was 7.1 which is up from his previous value sedimentation rate of 91 which is also higher. However Humira maintains a relatively high sedimentation rate. Most disturbingly was an albumin of 2.8 although this is also somewhat higher than the 2.2 we had previously. With some difficulty I think a home health nurse actually made him some Dakin's solution he is using a Dakin's wet-to-dry to the large sacral wound. He has the area on the left upper thigh as well BARRETT, GOLDIE A. ([id number]) Objective Constitutional Sitting or standing Blood Pressure is within target range for patient.. Pulse regular and within target range for patient.Marland Kitchen Respirations regular, non- labored and within target range.. Temperature is normal and within the target range for the patient.Marland Kitchen appears in no distress. Vitals Time Taken: 11:15 AM, Temperature:  98.6 F, Pulse: 79 bpm, Respiratory Rate: 18 breaths/min, Blood Pressure: 137/71 mmHg. Respiratory Respiratory effort is easy and symmetric bilaterally. Rate is normal at rest and on room air.. General Notes: Wound exam; the patient's large wound over his sacrum and surrounding buttocks actually has a much cleaner looking surface. There is no palpable bone. The granulation looks healthy no surrounding infection He has a second area on the left posterior thigh in close proximity to the gluteal fold. This looks fairly healthy and I felt we could try a silver alginate on this to see if this would help. Other thoughts were Hydrofera Blue or polymen but they tend to be difficult for home health to obtain Integumentary (Hair, Skin) Wound #10 status is Open. Original cause of wound was Pressure Injury. The wound is located on the Midline Sacrum. The wound  measures 15cm length x 6.6cm width x 7.3cm depth; 77.754cm^2 area and 567.607cm^3 volume. There is bone, muscle, and Fat Layer (Subcutaneous Tissue) exposed. There is no tunneling or undermining noted. There is a large amount of serous drainage noted. The wound margin is epibole. There is large (67-100%) red, pink granulation within the wound bed. There is a small (1-33%) amount of necrotic tissue within the wound bed including Adherent Slough. Wound #12 status is Open. Original cause of wound was Gradually Appeared. The wound is located on the Left,Proximal Upper Leg. The wound measures 5cm length x 2.5cm width x 0.1cm depth; 9.817cm^2 area and 0.982cm^3 volume. There is Fat Layer (Subcutaneous Tissue) exposed. There is no tunneling or undermining noted. There is a large amount of serous drainage noted. The wound margin is flat and intact. There is large (67-100%) red granulation within the wound bed. There is a small (1-33%) amount of necrotic tissue within the wound bed including Adherent Slough. 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 Wound #12 Left,Proximal Upper Leg: Change dressing every day. Follow-up Appointments: TELFORD, ARCHAMBEAU ([id number]) Wound #10 Midline Sacrum: Return Appointment in 1 month Wound #12  Left,Proximal Upper Leg: Return Appointment in 1 month Off-Loading: Wound #10 Midline Sacrum: Turn and reposition every 2 hours Other: - Keep pressure off of wounded area. Wound #12 Left,Proximal Upper Leg: Turn and reposition every 2 hours Other: - Keep pressure off of wounded area. Additional Orders / Instructions: Wound #10 Midline Sacrum: Increase protein intake. Wound #12 Left,Proximal Upper Leg: Increase protein intake. Home Health: Wound #10 Midline Sacrum: Continue Home Health Visits Please direct any NON-WOUND related issues/requests for orders to patient's Primary Care Physician Wound #12 Left,Proximal Upper Leg: [place] Visits Please direct any NON-WOUND related issues/requests for orders to patient's Primary Care Physician Medications-please add to medication list.: Wound #10 Midline Sacrum: Other: - Dakins Solution or equivalent Radiology ordered were: X-ray, other - sacrum/pelvis 1. I am going to continue with the Dakin's wet-to-dry to the large sacral wound and buttocks. There is no way from home health to consider  a wound VAC on this. We sent him to plastic surgery at [facility] a year or 2 ago and they were in close proximity to a surgical date but stopped that I think because of underlying osteomyelitis. 2. I wanted to repeat his x-ray again to monitor the osteomyelitis. He is inflammatory markers are somewhat elevated versus previously but not dramatically so 3. I did talk to him about his low albumin level. He already has Ensure plus protein as well as prostat. Admittedly he does not eat that much he says he never has only eating 1 meal a day. I emphasized that abnormal albumin level is fairly crucial to get any form of wound healing 4. Reminded him about offloading Electronic Signature(s) Signed: 05/20/2020 4:29:45 PM By: Linton Ham MD Entered By: Linton Ham on 05/20/2020 11:57:27 [name]  ([id number]) -------------------------------------------------------------------------------- [place] Details Patient Name: [name] Date of Service: 05/20/2020 Medical Record Number: [id number] Patient Account Number: [id number] Date of Birth/Sex: [birth year] (74 y.o. 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

## 2020-05-21 NOTE — Progress Notes (Signed)
CARLESTER, KASPAREK ([id number]) Visit Report for 05/20/2020 Arrival Information Details Patient Name: Bradley [name], Bradley [name]. Date of Service: 05/20/2020 11:00 AM Medical Record Number: [id number] Patient Account Number: [id number] Date of Birth/Sex: 1946-01-03 (74 y.o. M) Treating RN: Dolan Amen Primary Care Caison Hearn: [name] Other Clinician: Referring Remington Skalsky: [name] Treating Javad Salva/Extender: Tito Dine in Treatment: 42 Visit Information History Since Last Visit Pain Present Now: No Patient Arrived: Wheel Chair Arrival Time: 11:15 Accompanied By: self Transfer Assistance: [place] Patient Identification Verified: Yes Secondary Verification Process Completed: Yes Patient Requires Transmission-Based Precautions: No Patient Has Alerts: No Electronic Signature(s) Signed: 05/20/2020 12:11:10 PM By: Georges Mouse, Minus Breeding RN Entered By: Georges Mouse, Minus Breeding on 05/20/2020 11:15:51 Bradley [name] ([id number]) -------------------------------------------------------------------------------- Clinic Level of Care Assessment Details Patient Name: Bradley [name]. Date of Service: 05/20/2020 11:00 AM Medical Record Number: [id number] Patient Account Number: [id number] Date of Birth/Sex: 08/20/1945 (74 y.o. M) Treating RN: Dolan Amen Primary Care Brentley Landfair: [name] Other Clinician: Referring Jearline Hirschhorn: [name] Treating Alesandro Stueve/Extender: Tito Dine in Treatment: 52 Clinic Level of Care Assessment Items TOOL 4 Quantity Score X - Use when only an EandM is performed on FOLLOW-UP visit 1 0 ASSESSMENTS - Nursing Assessment / Reassessment X - Reassessment of Co-morbidities (includes updates in patient status) 1 10 X- 1 5 Reassessment of Adherence to Treatment Plan ASSESSMENTS - Wound and Skin Assessment / Reassessment []  - Simple Wound Assessment / Reassessment - one wound 0 X- 2 5 Complex Wound Assessment /  Reassessment - multiple wounds []  - 0 Dermatologic / Skin Assessment (not related to wound area) ASSESSMENTS - Focused Assessment []  - Circumferential Edema Measurements - multi extremities 0 []  - 0 Nutritional Assessment / Counseling / Intervention []  - 0 Lower Extremity Assessment (monofilament, tuning fork, pulses) []  - 0 Peripheral Arterial Disease Assessment (using hand held doppler) ASSESSMENTS - Ostomy and/or Continence Assessment and Care []  - Incontinence Assessment and Management 0 []  - 0 Ostomy Care Assessment and Management (repouching, etc.) PROCESS - Coordination of Care X - Simple Patient / Family Education for ongoing care 1 15 []  - 0 Complex (extensive) Patient / Family Education for ongoing care []  - 0 Staff obtains Programmer, systems, Records, Test Results / Process Orders []  - 0 Staff telephones HHA, Nursing Homes / Clarify orders / etc []  - 0 Routine Transfer to another Facility (non-emergent condition) []  - 0 Routine Hospital Admission (non-emergent condition) []  - 0 New Admissions / Biomedical engineer / Ordering NPWT, Apligraf, etc. []  - 0 Emergency Hospital Admission (emergent condition) X- 1 10 Simple Discharge Coordination []  - 0 Complex (extensive) Discharge Coordination PROCESS - Special Needs []  - Pediatric / Minor Patient Management 0 []  - 0 Isolation Patient Management []  - 0 Hearing / Language / Visual special needs []  - 0 Assessment of Community assistance (transportation, D/C planning, etc.) []  - 0 Additional assistance / Altered mentation []  - 0 Support Surface(s) Assessment (bed, cushion, seat, etc.) INTERVENTIONS - Wound Cleansing / Measurement Bradley [name], Bradley A. ([id number]) []  - 0 Simple Wound Cleansing - one wound X- 2 5 Complex Wound Cleansing - multiple wounds X- 1 5 Wound Imaging (photographs - any number of wounds) []  - 0 Wound Tracing (instead of photographs) []  - 0 Simple Wound Measurement - one wound X- 2 5 Complex  Wound Measurement - multiple wounds INTERVENTIONS - Wound Dressings []  - Small Wound Dressing one or multiple wounds 0 X- 1 15 Medium Wound Dressing one or multiple wounds []  -  0 Large Wound Dressing one or multiple wounds []  - 0 Application of Medications - topical []  - 0 Application of Medications - injection INTERVENTIONS - Miscellaneous []  - External ear exam 0 []  - 0 Specimen Collection (cultures, biopsies, blood, body fluids, etc.) []  - 0 Specimen(s) / Culture(s) sent or taken to Lab for analysis X- 1 10 Patient Transfer (multiple staff / Harrel Lemon Lift / Similar devices) []  - 0 Simple Staple / Suture removal (25 or less) []  - 0 Complex Staple / Suture removal (26 or more) []  - 0 Hypo / Hyperglycemic Management (close monitor of Blood Glucose) []  - 0 Ankle / Brachial Index (ABI) - do not check if billed separately X- 1 5 Vital Signs Has the patient been seen at the hospital within the last three years: Yes Total Score: 105 Level Of Care: New/Established - Level 3 Electronic Signature(s) Signed: 05/20/2020 12:11:10 PM By: Georges Mouse, Minus Breeding RN Entered By: Georges Mouse, Minus Breeding on 05/20/2020 11:44:37 Bradley [name] ([id number]) -------------------------------------------------------------------------------- Encounter Discharge Information Details Patient Name: Bradley Loa A. Date of Service: 05/20/2020 11:00 AM Medical Record Number: [id number] Patient Account Number: [id number] Date of Birth/Sex: 1946/03/09 (74 y.o. M) Treating RN: Dolan Amen Primary Care Breslyn Abdo: [name] Other Clinician: Referring Suresh Audi: [name] Treating Chiffon Kittleson/Extender: Tito Dine in Treatment: 55 Encounter Discharge Information Items Discharge Condition: Stable Ambulatory Status: Wheelchair Discharge Destination: Home Transportation: Private Auto Accompanied By: self Schedule Follow-up Appointment: Yes Clinical Summary of Care: Electronic  Signature(s) Signed: 05/20/2020 12:11:10 PM By: Georges Mouse, Minus Breeding RN Entered By: Georges Mouse, Minus Breeding on 05/20/2020 12:08:33 Bradley [name] ([id number]) -------------------------------------------------------------------------------- Lower Extremity Assessment Details Patient Name: Bradley [name], Bradley A. Date of Service: 05/20/2020 11:00 AM Medical Record Number: [id number] Patient Account Number: [id number] Date of Birth/Sex: 1946-05-18 (74 y.o. M) Treating RN: Dolan Amen Primary Care Nalleli Largent: [name] Other Clinician: Referring Martinez Boxx: [name] Treating Elisandro Jarrett/Extender: Tito Dine in Treatment: 63 Electronic Signature(s) Signed: 05/20/2020 12:11:10 PM By: Georges Mouse, Minus Breeding RN Entered By: Georges Mouse, Minus Breeding on 05/20/2020 11:30:55 Bradley [name] ([id number]) -------------------------------------------------------------------------------- Multi Wound Chart Details Patient Name: Bradley Loa A. Date of Service: 05/20/2020 11:00 AM Medical Record Number: [id number] Patient Account Number: [id number] Date of Birth/Sex: [birth year] (74 y.o. M) Treating RN: Dolan Amen Primary Care Shawni Volkov: [name] Other Clinician: Referring Kianna Billet: [name] Treating Renesmae Donahey/Extender: Tito Dine in Treatment: 32 Vital Signs Height(in): Pulse(bpm): 36 Weight(lbs): Blood Pressure(mmHg): 137/71 Body Mass Index(BMI): Temperature(F): 98.6 Respiratory Rate(breaths/min): 18 Photos: [N/A:N/A] Wound Location: Midline Sacrum Left, Proximal Upper Leg N/A Wounding Event: Pressure Injury Gradually Appeared N/A Primary Etiology: Pressure Ulcer Pressure Ulcer N/A Comorbid History: Anemia, Hypertension, History of Anemia, Hypertension, History of N/A pressure wounds, Rheumatoid pressure wounds, Rheumatoid Arthritis, Paraplegia Arthritis, Paraplegia Date Acquired: 06/07/2015 03/06/2020 N/A Weeks of Treatment: 44 5  N/A Wound Status: Open Open N/A Measurements L x W x D (cm) 15x6.6x7.3 5x2.5x0.1 N/A Area (cm) : 77.754 9.817 N/A Volume (cm) : 567.607 0.982 N/A % Reduction in Area: -50.90% 37.50% N/A % Reduction in Volume: -83.60% 37.50% N/A Classification: Category/Stage IV Unstageable/Unclassified N/A Exudate Amount: Large Large N/A Exudate Type: Serous Serous N/A Exudate Color: amber amber N/A Wound Margin: Epibole Flat and Intact N/A Granulation Amount: Large (67-100%) Large (67-100%) N/A Granulation Quality: Red, Pink Red N/A Necrotic Amount: Small (1-33%) Small (1-33%) N/A Exposed Structures: Fat Layer (Subcutaneous Tissue): Fat Layer (Subcutaneous Tissue): N/A Yes Yes Muscle: Yes Fascia: No Bone: Yes Tendon: No Fascia: No Muscle: No Tendon: No  Joint: No Joint: No Bone: No Epithelialization: None None N/A Treatment Notes Electronic Signature(s) Signed: 05/20/2020 4:29:45 PM By: Linton Ham MD Entered By: Linton Ham on 05/20/2020 11:48:26 Bradley [name] ([id number]) -------------------------------------------------------------------------------- [place] Details Patient Name: Bradley [name], Bradley A. Date of Service: 05/20/2020 11:00 AM Medical Record Number: [id number] Patient Account Number: [id number] Date of Birth/Sex: 12-Dec-1945 (74 y.o. M) Treating RN: Dolan Amen Primary Care [name] Hartis: [name] Other Clinician: Referring Tija Biss: [name] Treating Penelope Fittro/Extender: Tito Dine in Treatment: 74 Active Inactive Abuse / Safety / Falls / Self Care Management Nursing Diagnoses: Impaired physical mobility Goals: Patient will not develop complications from immobility Date Initiated: 07/16/2019 Target Resolution Date: 02/07/2020 Goal Status: Active Interventions: Assess Activities of Daily Living upon admission and as needed Notes: Pressure Nursing Diagnoses: Knowledge deficit related to causes and risk factors  for pressure ulcer development Goals: Patient will remain free from development of additional pressure ulcers Date Initiated: 07/16/2019 Target Resolution Date: 02/07/2020 Goal Status: Active Interventions: Provide education on pressure ulcers Notes: Electronic Signature(s) Signed: 05/20/2020 12:11:10 PM By: Georges Mouse, Minus Breeding RN Entered By: Georges Mouse, Minus Breeding on 05/20/2020 11:35:14 Bradley [name] ([id number]) -------------------------------------------------------------------------------- Pain Assessment Details Patient Name: Bradley Loa A. Date of Service: 05/20/2020 11:00 AM Medical Record Number: [id number] Patient Account Number: [id number] Date of Birth/Sex: 07-Sep-1945 (74 y.o. M) Treating RN: Dolan Amen Primary Care Daquann Merriott: [name] Other Clinician: Referring Coleen Cardiff: [name] Treating Elona Yinger/Extender: Tito Dine in Treatment: 10 Active Problems Location of Pain Severity and Description of Pain Patient Has Paino No Site Locations Rate the pain. Current Pain Level: 0 Pain Management and Medication Current Pain Management: Electronic Signature(s) Signed: 05/20/2020 12:11:10 PM By: Georges Mouse, Minus Breeding RN Entered By: Georges Mouse, Minus Breeding on 05/20/2020 11:20:48 Bradley [name] ([id number]) -------------------------------------------------------------------------------- Patient/Caregiver Education Details Patient Name: Bradley Loa A. Date of Service: 05/20/2020 11:00 AM Medical Record Number: [id number] Patient Account Number: [id number] Date of Birth/Gender: [birth year] (74 y.o. M) Treating RN: Dolan Amen Primary Care Physician: [name] Other Clinician: Referring Physician: [name] Treating Physician/Extender: Tito Dine in Treatment: 35 Education Assessment Education Provided To: Patient Education Topics Provided Nutrition: Handouts: Nutrition Methods:  Explain/Verbal Responses: State content correctly Electronic Signature(s) Signed: 05/20/2020 12:11:10 PM By: Georges Mouse, Minus Breeding RN Entered By: Georges Mouse, Minus Breeding on 05/20/2020 12:07:37 Bradley [name] ([id number]) -------------------------------------------------------------------------------- Wound Assessment Details Patient Name: Bradley Loa A. Date of Service: 05/20/2020 11:00 AM Medical Record Number: [id number] Patient Account Number: [id number] Date of Birth/Sex: [birth year] (74 y.o. M) Treating RN: Dolan Amen Primary Care Lazara Grieser: [name] Other Clinician: Referring Keylin Ferryman: [name] Treating Balraj Brayfield/Extender: Tito Dine in Treatment: 63 Wound Status Wound Number: 10 Primary Pressure Ulcer Etiology: Wound Location: Midline Sacrum Wound Open Wounding Event: Pressure Injury Status: Date Acquired: 06/07/2015 Comorbid Anemia, Hypertension, History of pressure wounds, Weeks Of Treatment: 44 History: Rheumatoid Arthritis, Paraplegia Clustered Wound: No Photos Wound Measurements Length: (cm) 15 % Reduc Width: (cm) 6.6 % Reduc Depth: (cm) 7.3 Epithel Area: (cm) 77.754 Tunnel Volume: (cm) 567.607 Underm tion in Area: -50.9% tion in Volume: -83.6% ialization: None ing: No ining: No Wound Description Classification: Category/Stage IV Foul O Wound Margin: Epibole Slough Exudate Amount: Large Exudate Type: Serous Exudate Color: amber dor After Cleansing: No /Fibrino Yes Wound Bed Granulation Amount: Large (67-100%) Exposed Structure Granulation Quality: Red, Pink Fascia Exposed: No Necrotic Amount: Small (1-33%) Fat Layer (Subcutaneous Tissue) Exposed: Yes Necrotic Quality: Adherent Slough Tendon Exposed: No Muscle Exposed: Yes Necrosis  of Muscle: No Joint Exposed: No Bone Exposed: Yes Treatment Notes Wound #10 (Midline Sacrum) Notes dakins soaked guaze, abd, tape Bradley [name], Bradley [name] ([id number]) Electronic  Signature(s) Signed: 05/20/2020 12:11:10 PM By: Georges Mouse, Minus Breeding RN Entered By: Georges Mouse, Minus Breeding on 05/20/2020 11:30:00 Bradley [name] ([id number]) -------------------------------------------------------------------------------- Wound Assessment Details Patient Name: Bradley Loa A. Date of Service: 05/20/2020 11:00 AM Medical Record Number: [id number] Patient Account Number: [id number] Date of Birth/Sex: Jun 12, 1945 (74 y.o. M) Treating RN: Dolan Amen Primary Care Damarrion Mimbs: [name] Other Clinician: Referring Hewitt Garner: [name] Treating Adya Wirz/Extender: Tito Dine in Treatment: 48 Wound Status Wound Number: 12 Primary Pressure Ulcer Etiology: Wound Location: Left, Proximal Upper Leg Wound Open Wounding Event: Gradually Appeared Status: Date Acquired: 03/06/2020 Comorbid Anemia, Hypertension, History of pressure wounds, Weeks Of Treatment: 5 History: Rheumatoid Arthritis, Paraplegia Clustered Wound: No Photos Wound Measurements Length: (cm) 5 Width: (cm) 2.5 Depth: (cm) 0.1 Area: (cm) 9.817 Volume: (cm) 0.982 % Reduction in Area: 37.5% % Reduction in Volume: 37.5% Epithelialization: None Tunneling: No Undermining: No Wound Description Classification: Unstageable/Unclassified Wound Margin: Flat and Intact Exudate Amount: Large Exudate Type: Serous Exudate Color: amber Foul Odor After Cleansing: No Slough/Fibrino Yes Wound Bed Granulation Amount: Large (67-100%) Exposed Structure Granulation Quality: Red Fascia Exposed: No Necrotic Amount: Small (1-33%) Fat Layer (Subcutaneous Tissue) Exposed: Yes Necrotic Quality: Adherent Slough Tendon Exposed: No Muscle Exposed: No Joint Exposed: No Bone Exposed: No Treatment Notes Wound #12 (Left, Proximal Upper Leg) Notes scell, abd, tape Electronic Signature(s) Signed: 05/20/2020 12:11:10 PM By: Georges Mouse, Minus Breeding RN Bradley [name], Bradley [name] ([id number]) Entered By:  Georges Mouse, Minus Breeding on 05/20/2020 11:30:43 Bradley [name] ([id number]) -------------------------------------------------------------------------------- [place] Details Patient Name: Bradley Loa A. Date of Service: 05/20/2020 11:00 AM Medical Record Number: [id number] Patient Account Number: [id number] Date of Birth/Sex: 1945/10/08 (74 y.o. M) Treating RN: Dolan Amen Primary Care Janiah Devinney: [name] Other Clinician: Referring Hadiyah Maricle: [name] Treating Aasiyah Auerbach/Extender: Tito Dine in Treatment: 44 Vital Signs Time Taken: 11:15 Temperature (F): 98.6 Pulse (bpm): 79 Respiratory Rate (breaths/min): 18 Blood Pressure (mmHg): 137/71 Reference Range: 80 - 120 mg / dl Electronic Signature(s) Signed: 05/20/2020 12:11:10 PM By: Georges Mouse, Minus Breeding RN Entered By: Georges Mouse, Minus Breeding on 05/20/2020 11:20:39

## 2020-05-22 DIAGNOSIS — L89154 Pressure ulcer of sacral region, stage 4: Secondary | ICD-10-CM | POA: Diagnosis not present

## 2020-05-22 DIAGNOSIS — D509 Iron deficiency anemia, unspecified: Secondary | ICD-10-CM | POA: Diagnosis not present

## 2020-05-22 DIAGNOSIS — N39498 Other specified urinary incontinence: Secondary | ICD-10-CM | POA: Diagnosis not present

## 2020-05-22 DIAGNOSIS — N401 Enlarged prostate with lower urinary tract symptoms: Secondary | ICD-10-CM | POA: Diagnosis not present

## 2020-05-22 DIAGNOSIS — G8220 Paraplegia, unspecified: Secondary | ICD-10-CM | POA: Diagnosis not present

## 2020-05-22 DIAGNOSIS — J309 Allergic rhinitis, unspecified: Secondary | ICD-10-CM | POA: Diagnosis not present

## 2020-05-22 DIAGNOSIS — I10 Essential (primary) hypertension: Secondary | ICD-10-CM | POA: Diagnosis not present

## 2020-05-22 DIAGNOSIS — S24104S Unspecified injury at T11-T12 level of thoracic spinal cord, sequela: Secondary | ICD-10-CM | POA: Diagnosis not present

## 2020-05-22 DIAGNOSIS — N319 Neuromuscular dysfunction of bladder, unspecified: Secondary | ICD-10-CM | POA: Diagnosis not present

## 2020-05-25 ENCOUNTER — Telehealth: Payer: Self-pay | Admitting: Urology

## 2020-05-25 ENCOUNTER — Inpatient Hospital Stay: Payer: Medicare HMO

## 2020-05-25 VITALS — BP 115/62 | HR 82 | Temp 95.6°F | Resp 20

## 2020-05-25 DIAGNOSIS — J309 Allergic rhinitis, unspecified: Secondary | ICD-10-CM | POA: Diagnosis not present

## 2020-05-25 DIAGNOSIS — G8220 Paraplegia, unspecified: Secondary | ICD-10-CM | POA: Diagnosis not present

## 2020-05-25 DIAGNOSIS — N319 Neuromuscular dysfunction of bladder, unspecified: Secondary | ICD-10-CM | POA: Diagnosis not present

## 2020-05-25 DIAGNOSIS — Z993 Dependence on wheelchair: Secondary | ICD-10-CM | POA: Diagnosis not present

## 2020-05-25 DIAGNOSIS — I10 Essential (primary) hypertension: Secondary | ICD-10-CM | POA: Diagnosis not present

## 2020-05-25 DIAGNOSIS — D508 Other iron deficiency anemias: Secondary | ICD-10-CM

## 2020-05-25 DIAGNOSIS — N401 Enlarged prostate with lower urinary tract symptoms: Secondary | ICD-10-CM | POA: Diagnosis not present

## 2020-05-25 DIAGNOSIS — N39498 Other specified urinary incontinence: Secondary | ICD-10-CM | POA: Diagnosis not present

## 2020-05-25 DIAGNOSIS — S24104S Unspecified injury at T11-T12 level of thoracic spinal cord, sequela: Secondary | ICD-10-CM | POA: Diagnosis not present

## 2020-05-25 DIAGNOSIS — D509 Iron deficiency anemia, unspecified: Secondary | ICD-10-CM | POA: Diagnosis not present

## 2020-05-25 DIAGNOSIS — L89154 Pressure ulcer of sacral region, stage 4: Secondary | ICD-10-CM | POA: Diagnosis not present

## 2020-05-25 MED ORDER — SODIUM CHLORIDE 0.9 % IV SOLN: 200.0000 mg | Freq: Once | INTRAVENOUS | Status: DC

## 2020-05-25 MED ORDER — SODIUM CHLORIDE 0.9 % IV SOLN
INTRAVENOUS | Status: DC
  Filled 2020-05-25: qty 250

## 2020-05-25 MED ORDER — IRON SUCROSE 20 MG/ML IV SOLN
200.0000 mg | Freq: Once | INTRAVENOUS | Status: AC
  Administered 2020-05-25: 14:00:00 200 mg via INTRAVENOUS
  Filled 2020-05-25: qty 10

## 2020-05-25 NOTE — Progress Notes (Signed)
Stable at discharge 

## 2020-05-25 NOTE — Telephone Encounter (Signed)
Would you call Mr. Gehret and get him rescheduled to recheck his urine, foreskin and PSA?

## 2020-05-26 NOTE — Telephone Encounter (Signed)
LMOM to return call to reschedule appointment. 

## 2020-05-27 DIAGNOSIS — D509 Iron deficiency anemia, unspecified: Secondary | ICD-10-CM | POA: Diagnosis not present

## 2020-05-27 DIAGNOSIS — G8220 Paraplegia, unspecified: Secondary | ICD-10-CM | POA: Diagnosis not present

## 2020-05-27 DIAGNOSIS — L89154 Pressure ulcer of sacral region, stage 4: Secondary | ICD-10-CM | POA: Diagnosis not present

## 2020-05-27 DIAGNOSIS — N39498 Other specified urinary incontinence: Secondary | ICD-10-CM | POA: Diagnosis not present

## 2020-05-27 DIAGNOSIS — I10 Essential (primary) hypertension: Secondary | ICD-10-CM | POA: Diagnosis not present

## 2020-05-27 DIAGNOSIS — N401 Enlarged prostate with lower urinary tract symptoms: Secondary | ICD-10-CM | POA: Diagnosis not present

## 2020-05-27 DIAGNOSIS — S24104S Unspecified injury at T11-T12 level of thoracic spinal cord, sequela: Secondary | ICD-10-CM | POA: Diagnosis not present

## 2020-05-27 DIAGNOSIS — J309 Allergic rhinitis, unspecified: Secondary | ICD-10-CM | POA: Diagnosis not present

## 2020-05-27 DIAGNOSIS — N319 Neuromuscular dysfunction of bladder, unspecified: Secondary | ICD-10-CM | POA: Diagnosis not present

## 2020-05-29 DIAGNOSIS — D509 Iron deficiency anemia, unspecified: Secondary | ICD-10-CM | POA: Diagnosis not present

## 2020-05-29 DIAGNOSIS — I10 Essential (primary) hypertension: Secondary | ICD-10-CM | POA: Diagnosis not present

## 2020-05-29 DIAGNOSIS — S24104S Unspecified injury at T11-T12 level of thoracic spinal cord, sequela: Secondary | ICD-10-CM | POA: Diagnosis not present

## 2020-05-29 DIAGNOSIS — J309 Allergic rhinitis, unspecified: Secondary | ICD-10-CM | POA: Diagnosis not present

## 2020-05-29 DIAGNOSIS — N39498 Other specified urinary incontinence: Secondary | ICD-10-CM | POA: Diagnosis not present

## 2020-05-29 DIAGNOSIS — L89154 Pressure ulcer of sacral region, stage 4: Secondary | ICD-10-CM | POA: Diagnosis not present

## 2020-05-29 DIAGNOSIS — G8220 Paraplegia, unspecified: Secondary | ICD-10-CM | POA: Diagnosis not present

## 2020-05-29 DIAGNOSIS — N319 Neuromuscular dysfunction of bladder, unspecified: Secondary | ICD-10-CM | POA: Diagnosis not present

## 2020-05-29 DIAGNOSIS — N401 Enlarged prostate with lower urinary tract symptoms: Secondary | ICD-10-CM | POA: Diagnosis not present

## 2020-06-01 DIAGNOSIS — D509 Iron deficiency anemia, unspecified: Secondary | ICD-10-CM | POA: Diagnosis not present

## 2020-06-01 DIAGNOSIS — G8220 Paraplegia, unspecified: Secondary | ICD-10-CM | POA: Diagnosis not present

## 2020-06-01 DIAGNOSIS — L89154 Pressure ulcer of sacral region, stage 4: Secondary | ICD-10-CM | POA: Diagnosis not present

## 2020-06-01 DIAGNOSIS — N401 Enlarged prostate with lower urinary tract symptoms: Secondary | ICD-10-CM | POA: Diagnosis not present

## 2020-06-01 DIAGNOSIS — N39498 Other specified urinary incontinence: Secondary | ICD-10-CM | POA: Diagnosis not present

## 2020-06-01 DIAGNOSIS — M869 Osteomyelitis, unspecified: Secondary | ICD-10-CM | POA: Diagnosis not present

## 2020-06-01 DIAGNOSIS — N319 Neuromuscular dysfunction of bladder, unspecified: Secondary | ICD-10-CM | POA: Diagnosis not present

## 2020-06-01 DIAGNOSIS — S24104S Unspecified injury at T11-T12 level of thoracic spinal cord, sequela: Secondary | ICD-10-CM | POA: Diagnosis not present

## 2020-06-01 DIAGNOSIS — J309 Allergic rhinitis, unspecified: Secondary | ICD-10-CM | POA: Diagnosis not present

## 2020-06-01 DIAGNOSIS — I10 Essential (primary) hypertension: Secondary | ICD-10-CM | POA: Diagnosis not present

## 2020-06-02 DIAGNOSIS — N319 Neuromuscular dysfunction of bladder, unspecified: Secondary | ICD-10-CM | POA: Diagnosis not present

## 2020-06-03 DIAGNOSIS — G8220 Paraplegia, unspecified: Secondary | ICD-10-CM | POA: Diagnosis not present

## 2020-06-03 DIAGNOSIS — N39498 Other specified urinary incontinence: Secondary | ICD-10-CM | POA: Diagnosis not present

## 2020-06-03 DIAGNOSIS — L89154 Pressure ulcer of sacral region, stage 4: Secondary | ICD-10-CM | POA: Diagnosis not present

## 2020-06-03 DIAGNOSIS — N401 Enlarged prostate with lower urinary tract symptoms: Secondary | ICD-10-CM | POA: Diagnosis not present

## 2020-06-03 DIAGNOSIS — D509 Iron deficiency anemia, unspecified: Secondary | ICD-10-CM | POA: Diagnosis not present

## 2020-06-03 DIAGNOSIS — N319 Neuromuscular dysfunction of bladder, unspecified: Secondary | ICD-10-CM | POA: Diagnosis not present

## 2020-06-03 DIAGNOSIS — J309 Allergic rhinitis, unspecified: Secondary | ICD-10-CM | POA: Diagnosis not present

## 2020-06-03 DIAGNOSIS — S24104S Unspecified injury at T11-T12 level of thoracic spinal cord, sequela: Secondary | ICD-10-CM | POA: Diagnosis not present

## 2020-06-03 DIAGNOSIS — I10 Essential (primary) hypertension: Secondary | ICD-10-CM | POA: Diagnosis not present

## 2020-06-05 DIAGNOSIS — N401 Enlarged prostate with lower urinary tract symptoms: Secondary | ICD-10-CM | POA: Diagnosis not present

## 2020-06-05 DIAGNOSIS — G8220 Paraplegia, unspecified: Secondary | ICD-10-CM | POA: Diagnosis not present

## 2020-06-05 DIAGNOSIS — I10 Essential (primary) hypertension: Secondary | ICD-10-CM | POA: Diagnosis not present

## 2020-06-05 DIAGNOSIS — L89154 Pressure ulcer of sacral region, stage 4: Secondary | ICD-10-CM | POA: Diagnosis not present

## 2020-06-05 DIAGNOSIS — D509 Iron deficiency anemia, unspecified: Secondary | ICD-10-CM | POA: Diagnosis not present

## 2020-06-05 DIAGNOSIS — N319 Neuromuscular dysfunction of bladder, unspecified: Secondary | ICD-10-CM | POA: Diagnosis not present

## 2020-06-05 DIAGNOSIS — S24104S Unspecified injury at T11-T12 level of thoracic spinal cord, sequela: Secondary | ICD-10-CM | POA: Diagnosis not present

## 2020-06-05 DIAGNOSIS — J309 Allergic rhinitis, unspecified: Secondary | ICD-10-CM | POA: Diagnosis not present

## 2020-06-05 DIAGNOSIS — N39498 Other specified urinary incontinence: Secondary | ICD-10-CM | POA: Diagnosis not present

## 2020-06-08 DIAGNOSIS — J309 Allergic rhinitis, unspecified: Secondary | ICD-10-CM | POA: Diagnosis not present

## 2020-06-08 DIAGNOSIS — N401 Enlarged prostate with lower urinary tract symptoms: Secondary | ICD-10-CM | POA: Diagnosis not present

## 2020-06-08 DIAGNOSIS — N319 Neuromuscular dysfunction of bladder, unspecified: Secondary | ICD-10-CM | POA: Diagnosis not present

## 2020-06-08 DIAGNOSIS — I10 Essential (primary) hypertension: Secondary | ICD-10-CM | POA: Diagnosis not present

## 2020-06-08 DIAGNOSIS — D509 Iron deficiency anemia, unspecified: Secondary | ICD-10-CM | POA: Diagnosis not present

## 2020-06-08 DIAGNOSIS — S24104S Unspecified injury at T11-T12 level of thoracic spinal cord, sequela: Secondary | ICD-10-CM | POA: Diagnosis not present

## 2020-06-08 DIAGNOSIS — N39498 Other specified urinary incontinence: Secondary | ICD-10-CM | POA: Diagnosis not present

## 2020-06-08 DIAGNOSIS — G8220 Paraplegia, unspecified: Secondary | ICD-10-CM | POA: Diagnosis not present

## 2020-06-08 DIAGNOSIS — L89154 Pressure ulcer of sacral region, stage 4: Secondary | ICD-10-CM | POA: Diagnosis not present

## 2020-06-10 DIAGNOSIS — I10 Essential (primary) hypertension: Secondary | ICD-10-CM | POA: Diagnosis not present

## 2020-06-10 DIAGNOSIS — N401 Enlarged prostate with lower urinary tract symptoms: Secondary | ICD-10-CM | POA: Diagnosis not present

## 2020-06-10 DIAGNOSIS — S24104S Unspecified injury at T11-T12 level of thoracic spinal cord, sequela: Secondary | ICD-10-CM | POA: Diagnosis not present

## 2020-06-10 DIAGNOSIS — G8220 Paraplegia, unspecified: Secondary | ICD-10-CM | POA: Diagnosis not present

## 2020-06-10 DIAGNOSIS — D509 Iron deficiency anemia, unspecified: Secondary | ICD-10-CM | POA: Diagnosis not present

## 2020-06-10 DIAGNOSIS — N39498 Other specified urinary incontinence: Secondary | ICD-10-CM | POA: Diagnosis not present

## 2020-06-10 DIAGNOSIS — L89154 Pressure ulcer of sacral region, stage 4: Secondary | ICD-10-CM | POA: Diagnosis not present

## 2020-06-10 DIAGNOSIS — N319 Neuromuscular dysfunction of bladder, unspecified: Secondary | ICD-10-CM | POA: Diagnosis not present

## 2020-06-10 DIAGNOSIS — J309 Allergic rhinitis, unspecified: Secondary | ICD-10-CM | POA: Diagnosis not present

## 2020-06-12 DIAGNOSIS — L89154 Pressure ulcer of sacral region, stage 4: Secondary | ICD-10-CM | POA: Diagnosis not present

## 2020-06-12 DIAGNOSIS — G8220 Paraplegia, unspecified: Secondary | ICD-10-CM | POA: Diagnosis not present

## 2020-06-12 DIAGNOSIS — N39498 Other specified urinary incontinence: Secondary | ICD-10-CM | POA: Diagnosis not present

## 2020-06-12 DIAGNOSIS — I10 Essential (primary) hypertension: Secondary | ICD-10-CM | POA: Diagnosis not present

## 2020-06-12 DIAGNOSIS — S24104S Unspecified injury at T11-T12 level of thoracic spinal cord, sequela: Secondary | ICD-10-CM | POA: Diagnosis not present

## 2020-06-12 DIAGNOSIS — N401 Enlarged prostate with lower urinary tract symptoms: Secondary | ICD-10-CM | POA: Diagnosis not present

## 2020-06-12 DIAGNOSIS — J309 Allergic rhinitis, unspecified: Secondary | ICD-10-CM | POA: Diagnosis not present

## 2020-06-12 DIAGNOSIS — N319 Neuromuscular dysfunction of bladder, unspecified: Secondary | ICD-10-CM | POA: Diagnosis not present

## 2020-06-12 DIAGNOSIS — D509 Iron deficiency anemia, unspecified: Secondary | ICD-10-CM | POA: Diagnosis not present

## 2020-06-15 DIAGNOSIS — N319 Neuromuscular dysfunction of bladder, unspecified: Secondary | ICD-10-CM | POA: Diagnosis not present

## 2020-06-15 DIAGNOSIS — D509 Iron deficiency anemia, unspecified: Secondary | ICD-10-CM | POA: Diagnosis not present

## 2020-06-15 DIAGNOSIS — N401 Enlarged prostate with lower urinary tract symptoms: Secondary | ICD-10-CM | POA: Diagnosis not present

## 2020-06-15 DIAGNOSIS — G8220 Paraplegia, unspecified: Secondary | ICD-10-CM | POA: Diagnosis not present

## 2020-06-15 DIAGNOSIS — S24104S Unspecified injury at T11-T12 level of thoracic spinal cord, sequela: Secondary | ICD-10-CM | POA: Diagnosis not present

## 2020-06-15 DIAGNOSIS — N39498 Other specified urinary incontinence: Secondary | ICD-10-CM | POA: Diagnosis not present

## 2020-06-15 DIAGNOSIS — L89154 Pressure ulcer of sacral region, stage 4: Secondary | ICD-10-CM | POA: Diagnosis not present

## 2020-06-15 DIAGNOSIS — I10 Essential (primary) hypertension: Secondary | ICD-10-CM | POA: Diagnosis not present

## 2020-06-15 DIAGNOSIS — J309 Allergic rhinitis, unspecified: Secondary | ICD-10-CM | POA: Diagnosis not present

## 2020-06-15 NOTE — Telephone Encounter (Signed)
Would you try to get in contact with Bradley [name]?  He needs an appointment for elevated PSA.

## 2020-06-16 DIAGNOSIS — S24104S Unspecified injury at T11-T12 level of thoracic spinal cord, sequela: Secondary | ICD-10-CM | POA: Diagnosis not present

## 2020-06-16 DIAGNOSIS — I10 Essential (primary) hypertension: Secondary | ICD-10-CM | POA: Diagnosis not present

## 2020-06-16 DIAGNOSIS — N319 Neuromuscular dysfunction of bladder, unspecified: Secondary | ICD-10-CM | POA: Diagnosis not present

## 2020-06-16 DIAGNOSIS — D509 Iron deficiency anemia, unspecified: Secondary | ICD-10-CM | POA: Diagnosis not present

## 2020-06-16 DIAGNOSIS — N39498 Other specified urinary incontinence: Secondary | ICD-10-CM | POA: Diagnosis not present

## 2020-06-16 DIAGNOSIS — G8220 Paraplegia, unspecified: Secondary | ICD-10-CM | POA: Diagnosis not present

## 2020-06-16 DIAGNOSIS — J309 Allergic rhinitis, unspecified: Secondary | ICD-10-CM | POA: Diagnosis not present

## 2020-06-16 DIAGNOSIS — N401 Enlarged prostate with lower urinary tract symptoms: Secondary | ICD-10-CM | POA: Diagnosis not present

## 2020-06-16 DIAGNOSIS — L89154 Pressure ulcer of sacral region, stage 4: Secondary | ICD-10-CM | POA: Diagnosis not present

## 2020-06-17 ENCOUNTER — Ambulatory Visit
Admission: RE | Admit: 2020-06-17 | Discharge: 2020-06-17 | Disposition: A | Discharge status: Home / Self Care | Payer: Medicare HMO | Source: Ambulatory Visit | Attending: Internal Medicine | Admitting: Internal Medicine

## 2020-06-17 ENCOUNTER — Encounter: Payer: Medicare HMO | Attending: Internal Medicine | Admitting: Internal Medicine

## 2020-06-17 ENCOUNTER — Other Ambulatory Visit: Payer: Self-pay

## 2020-06-17 DIAGNOSIS — B999 Unspecified infectious disease: Secondary | ICD-10-CM | POA: Insufficient documentation

## 2020-06-17 DIAGNOSIS — M8668 Other chronic osteomyelitis, other site: Secondary | ICD-10-CM | POA: Diagnosis not present

## 2020-06-17 DIAGNOSIS — L97112 Non-pressure chronic ulcer of right thigh with fat layer exposed: Secondary | ICD-10-CM | POA: Diagnosis not present

## 2020-06-17 DIAGNOSIS — Z933 Colostomy status: Secondary | ICD-10-CM | POA: Insufficient documentation

## 2020-06-17 DIAGNOSIS — D509 Iron deficiency anemia, unspecified: Secondary | ICD-10-CM | POA: Diagnosis not present

## 2020-06-17 DIAGNOSIS — G8220 Paraplegia, unspecified: Secondary | ICD-10-CM | POA: Diagnosis not present

## 2020-06-17 DIAGNOSIS — E8809 Other disorders of plasma-protein metabolism, not elsewhere classified: Secondary | ICD-10-CM | POA: Insufficient documentation

## 2020-06-17 DIAGNOSIS — L97128 Non-pressure chronic ulcer of left thigh with other specified severity: Secondary | ICD-10-CM | POA: Diagnosis not present

## 2020-06-17 DIAGNOSIS — M160 Bilateral primary osteoarthritis of hip: Secondary | ICD-10-CM | POA: Diagnosis not present

## 2020-06-17 DIAGNOSIS — L89154 Pressure ulcer of sacral region, stage 4: Secondary | ICD-10-CM | POA: Insufficient documentation

## 2020-06-17 DIAGNOSIS — L97111 Non-pressure chronic ulcer of right thigh limited to breakdown of skin: Secondary | ICD-10-CM | POA: Insufficient documentation

## 2020-06-17 DIAGNOSIS — M533 Sacrococcygeal disorders, not elsewhere classified: Secondary | ICD-10-CM | POA: Diagnosis not present

## 2020-06-17 DIAGNOSIS — L89159 Pressure ulcer of sacral region, unspecified stage: Secondary | ICD-10-CM | POA: Diagnosis not present

## 2020-06-17 DIAGNOSIS — L97122 Non-pressure chronic ulcer of left thigh with fat layer exposed: Secondary | ICD-10-CM | POA: Diagnosis not present

## 2020-06-17 DIAGNOSIS — S31000A Unspecified open wound of lower back and pelvis without penetration into retroperitoneum, initial encounter: Secondary | ICD-10-CM | POA: Diagnosis not present

## 2020-06-19 DIAGNOSIS — D509 Iron deficiency anemia, unspecified: Secondary | ICD-10-CM | POA: Diagnosis not present

## 2020-06-19 DIAGNOSIS — N319 Neuromuscular dysfunction of bladder, unspecified: Secondary | ICD-10-CM | POA: Diagnosis not present

## 2020-06-19 DIAGNOSIS — J309 Allergic rhinitis, unspecified: Secondary | ICD-10-CM | POA: Diagnosis not present

## 2020-06-19 DIAGNOSIS — I10 Essential (primary) hypertension: Secondary | ICD-10-CM | POA: Diagnosis not present

## 2020-06-19 DIAGNOSIS — L89154 Pressure ulcer of sacral region, stage 4: Secondary | ICD-10-CM | POA: Diagnosis not present

## 2020-06-19 DIAGNOSIS — S24104S Unspecified injury at T11-T12 level of thoracic spinal cord, sequela: Secondary | ICD-10-CM | POA: Diagnosis not present

## 2020-06-19 DIAGNOSIS — G8220 Paraplegia, unspecified: Secondary | ICD-10-CM | POA: Diagnosis not present

## 2020-06-19 DIAGNOSIS — N39498 Other specified urinary incontinence: Secondary | ICD-10-CM | POA: Diagnosis not present

## 2020-06-19 DIAGNOSIS — N401 Enlarged prostate with lower urinary tract symptoms: Secondary | ICD-10-CM | POA: Diagnosis not present

## 2020-06-19 NOTE — Telephone Encounter (Signed)
Appointments made. Patient states he will try to arrange transportation.

## 2020-06-20 NOTE — Progress Notes (Signed)
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 hours that time other than that he offloads this. Dimensions of the wound are down 1 cm in both directions. He underwent an aggressive debridement on his last visit of thick circumferential skin and subcutaneous tissue. It is possible at some point in the future he is going to need this done again 05/24/16; the patient returns today for review of his stage III sacral pressure ulcer. We have  been using Aquacel Ag he tells me that he changes this up to twice a day. I'm not really certain of the reason for this frequency of changing. He has some involvement from the home health nurses but I think is doing most of the changing himself which I think because of his paraplegia would be a very difficult exercise. Nevertheless he states that there is "wetness". I am not sure if there is another dressing that we could easily changed that much. I'd wanted to change to [facility] but I'll need to have a sense of how frequent  he would need to change this. 06/14/16; this is a patient returns for review of his stage III sacral pressure ulcer. We have been using Aquacel Ag and over the last 2 visits he has had extensive debridement so of the thick circumferential skin and subcutaneous tissue that surrounds this wound. In spite of this really absolutely no change in the condition of the wound warrants measurements. We have [facility] home health I believe changing the dressing on 3 occasions the patient states he does this on one occasion himself 06/28/16; this is a patient who has a fairly large stage III sacral pressure ulcer. I changed him to [facility] from Aquacel 2 weeks ago. He KAYDENCE, KESTER ([id number]) returns today in follow-up. In the meantime a nurse from [facility] has calledrequesting ordering of a wound VAC. He had this discussion before. The problem is the proximity of the lowest edge of this wound to the patient's anal opening roughly 3/4 of an inch. Can't see how this can be arranged. Apparently the nurse who is calling has a lot of experience, the question would be then when she is not available would be doing this. I would not have thought that this wound is not amenable to a wound VAC because of this reason 07/12/16; the patient comes in today and I have signed orders for a wound VAC. The home health team through [facility] is convinced that he can benefit from this even  though there is close proximity to his anal opening beneath the gluteal clefts. The patient does not have a bowel regimen but states he has a bowel movement every 2 days this will also provide some problem with regards to the vac seal 07/26/16; the patient never did obtain a [place] wound VAC as he could not afford the $200 per month co-pay we have been using Hydrofera Blue now for 6 weeks or so. No major change in this wound at all. He is still not interested in the concept of plastic surgery. There changing the dressing every second day 08/09/16; the patient arrives with a wound precisely in the same situation. In keeping with the plan I outlined last time extensive debridement with an open curet the surface of this is not completely viable. Still has some degree of surrounding thick skin and subcutaneous tissue. No evidence of infection. Once again I have had a conversation with him about plastic surgery, he is simply not interested. 08/23/16; wound is really no different. Thick circumferential skin and subcutaneous tissue around the wound edge which is a lot better from debridement we did earlier in the year. The surface of the wound looks viable however with a curet there is definitely a gritty surface to this. We use Medihoney for a while, he could not afford Santyl. I don't think we could get a supply of Iodoflex. He talks a little more positively about the concept of plastic surgery which I've gone over with him today 08/31/16;; patient arrives in clinic today with the wound surface really no different there is no changes in dimensions. I debrided today surface on the left upper side of this wound aggressively week ago there is no real change here no evidence of epithelialization. The problem with debridement in the clinic is that he believes from this very liberally. We have been using Sorbact. 09/21/16; absolutely no change in the appearance or measurements of this wound. More recently I've been  debrided in this aggressively and using sorbact to see if we could get to  a better wound surface. Although this visually looks satisfactory, debridement reveals a very gritty surface to this. However even with this debridement and removal of thick nonviable skin and subcutaneous tissue from around the large amount of the circumference of this wound we have made absolutely no progress. This may be an offloading issue I'm just not completely certain. It has 2 close proximity in its inferior aspect to consider negative pressure therapy 10/26/16; READMISSION This patient called our clinic yesterday to report an odor in his wound. He had been to see plastic surgery at [facility] at our request after his last visit on 09/21/16; we have been seeing him for several months with a large stage III wound. He had been sent to general surgery for consideration of a colostomy, that appointment was not until mid June He comes in today with a temperature of 101. He is reporting an odor in the wound since last weekend. 01/10/17 Readmission: 01/10/17 On evaluation today it is noted that patient has been seen by plastic surgery at [facility] since he was last evaluated here. They did discuss with him the possibility of a flap according to the notes but unfortunately at this point he was not quite ready to proceed with surgery and instead wanted to give the Wound VAC a try. In the hospital they were able to get a good seal on the Wound VAC. Unfortunately since that time they have been having trouble in regard to his current home health company keeping a simple on the Wound VAC. He would like to switch to a different home health company. With that being said it sounds as if the problem is that his wound VAC is not feeling at the lower portion of his back and he tells me that he can take some of the clear plastic and put over that area when the sill breaks and it will correct it for time. He has no discomfort or pain which is good news. He  has been treated with IV vancomycin since he was last seen here and has an appointment with a infectious disease specialist in two days on 01/12/17. Otherwise he was transferred back to [place] for continuing to monitor and manage is wound as she progresses with a Wound VAC for the time being. 01/17/17 on evaluation today patient continues to show evidence of slight improvement with the Wound VAC fortunately there's no evidence of infection or otherwise worsening condition in general. Nonetheless we were unable to get him switch to [facility] in regard to home help from his current company. I'm not sure the reasoning behind but for some reason he was not accepted as a patient with him. Continue to apply the Wound VAC which does still show that some maceration around the wound edges but the wound measurements were slightly improved. No fevers, chills, nausea, or vomiting noted at this time. 02/14/17; this patient I have not seen in 5 months although he has been readmitted to our clinic seen by our physician assistant Jeri Cos twice in early August. I have looked through [facility] notes care everywhere. The patient saw plastic surgery in May [Dr. Bhatt}. The patient was sent to general surgery and ultimately had a colostomy placed. On 11/29/16. This was after he was admitted to [facility] sometime in May. An MRI of the pelvis on 5/23 showed osteomyelitis of the coccyx. An attempt was made to drain fluid that was not successful. He was treated with empiric broad-spectrum antibiotics VAC/cefepime/Flagyl starting on 11/02/16 with plans for  a 6 week course. According to their notes he was sent to a nursing home. Was last seen by Dr. Myriam Jacobson of plastic surgery on 12/28/16. The first part of the note is a long dissertation about the difficulties finding adequate patients for flap closure of pressure ulcers. At that time the wound was noted to be stage IV based I think on underlying infection no exposed bone and healthy  granulation tissue. 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 He was last seen by infectious disease 01/12/17 A Dr. Uvaldo Rising. His note says that Mr. Garrette was not interested in a flap closure for referring a trial of the wound VAC. As previously anticipated the wound VAC could not be maintained as an outpatient in the community. He is now using something similar to a Dakin's wet to dry recommended by [facility] VASHE solution. He is placing this twice a day himself. This is almost s hopeless setting in terms of heeling 02/28/17; he is using a Dakin's wet to dry. Most of the wound surface looks satisfactory however the deeper area over his coccyx now has exposed bone I'm not sure if I noted this last week. 03/21/17; patient is usingVASHE solution wet to dry which I gather is a variation on Dakin solution. He has home health changing this 3 times a week the other days he does this himself. His appointment with plastic surgery 04/18/17; patient continues to use a variant of Dakin solution I believe. His wound continues to have a clean viable surface. The 2 areas of exposed bone in the center of this wound had closed over. He has an appointment with plastic surgery on December 5 at which time I hope that there'll be a plan for myocutaneous flap closure In looking through [facility] I couldn't find any more plastic surgery appointments. I did come across the fact that he is been followed by hematology for a microcytic hypochromic anemia. He had a reasonably normal looking hemoglobin electrophoresis. His iron level was 10 and according to the patient he is going for IV iron infusions starting tomorrow. He had a sedimentation rate of 74. More problematically from a pure wound care point of view his albumin was 2.7 earlier this month 05/17/17; this is a patient I follow monthly. He has a large now stage IV wound over his bilateral buttocks with close proximity to his  anal Bradley [name], Bradley A. ([id number]) opening. More recently he has developed a large area with exposed bone in the center of this probably secondary to the underlying osteomyelitis E had in the summer. He also follows with Dr. Myriam Jacobson at [facility] who is plastic surgery. He had an appointment earlier this month and according to the patient Dr. Myriam Jacobson does not want to proceed with any attempted flap closure. Although I do not have current access to her note in care everywhere this is likely due to exposed bone. Again according to the patient they did a bone biopsy. He is still using a variant of Dakin solution changing twice a day. He has home health. The patient is not able to give me a firm answer about how long he spends on this in his wheelchair The patient also states that Dr. Myriam Jacobson wanted to reconsider a wound VAC. I really don't see this as a viable option at least not in the outpatient setting. The wound itself is frankly to close to his anal opening to maintain a seal. The last time we tried  to do this home health was unable to manage it. It might be possible to maintain a wound VAC in this setting outside of the home such as a skilled nursing facility or an [place] however I am doubtful about this even in that setting **** READMISSION 09/21/17-He is here for evaluation of stage IV sacral ulcer. Since his last evaluation here in December he has completed treatment for sacral osteomyelitis. He was at [facility] for IV therapy and NPWT dressing changes. He was discharged, with home health services, in February. He admits that while in the skilled facility he had "80%" success with maintaining dressing, since discharge he has had approximately "40%" success with maintaining wound VAC dressing. We discussed at length that this is not a safe or recommended option. We will apply Dakin's wet to dry dressing daily and he will follow-up next week. He is accompanied today by his sister who is willing to  assist in dressing changes; they will discuss the social issue as he feels he is capable of changing dressing daily when home health is not able. 09/28/17-He is here in follow-up evaluation for stage IV sacral pressure ulcer. He has been using the Dakin's wet-to-dry daily; he continues with home health. He is not accompanied by anyone at this visit. He will follow up in two weeks per his request/preference. 10/12/17 on evaluation today patient appears to be doing very well. The Dakin solution went to dry packings do seem to be helping him as far as the sacral wound is concerned I'm not seeing anything that has me more concerned as far as infection or otherwise is concerned. Overall I'm pleased with the appearance of the wound. 10/26/17-He is here in follow up evaluation for a stage IV sacral ulcer. He continues with daily Dakin's wet-to-dry. He is voicing no complaints or concerns. He will follow-up in 2 weeks 11/16/17-He is here for follow up evaluation for a palliative stage IV sacral pressure ulcer. We will continue with Dakin's wet-to-dry. He will follow- up in 4 weeks. He is expressing concern/complaint regarding new bed that has arrived, stating he is unable to manipulate/maneuver it due to the bed crank being at the foot of the bed. 12/14/17-He is here for evaluation for palliative stage IV sacral ulcer. He is voicing no complaints or concerns. We will continue with one-to-one ratio of saline and dakins. He will follow-up in 3 weeks 01/04/18-He is here in follow-up evaluation for palliative stage IV sacral ulcer. He is inquiring about reinstating the negative pressure wound therapy. We discussed at length that the negative pressure wound therapy in the home setting has not been successful for him repeatedly with loss of cereal and unavailability of 24/7 help; reminded him that home health is not available 24/7 when loss of seal occurs. He does verbalize understanding to this and does not pursue. We  also discussed the palliative nature of this ulcer (given no significant change/improvement in measurement/appearance, not a candidate for muscle flap per plastic surgery, and continued independent living) and that the goal is for maintenance, decrease in infection and minimizing/avoiding deterioration given that he is independent in his care, does not have home health and requires daily dressing changes secondary to drainage amount. He is inquiring about a wound clinic in [facility], I have informed him that I am unfamiliar with that clinic but that he is encouraged to seek another opinion if that is his desire. We will continue with dakins and he will follow up in three weeks 01/25/18-He is  here in follow-up evaluation for palliative stage IV sacral ulcer. He continues with Dakin's/saline 1:1 mixture wet to dry dressing changes. He states he has an appointment at [facility] on 9/17 for evaluation of surgical intervention/closure of the sacral ulcer. He will follow-up here in 4 weeks Readmission: 07/16/2019 upon evaluation today patient appears to be doing really about the same as when he was previously seen here in the wound care center. He most recently was a patient of [name] back when she was still working here in the center but had been referred to [facility] for consideration of a flap. With that being said the surgeon there at [facility] stated that this was too large for her flap and they have been attempting to get this smaller in order to be able to proceed with a flap. Nonetheless unfortunately he has had a cycle of going back and forth between the osteomyelitis flaring and then sent him back and then making a little bit of progress only to be sent back again. It sounds like most recently they have been using a Iodoflex type dressing at this point which does not seem to have done any harm by any means. With that being said this wound seems to be quite large for using Iodoflex throughout and subsequently I think he may  do much better with the use of Vioxx moistened gauze which would be safe for the new tissue growing and also keep the wound quite nice and clean. The patient is not opposed to this he in fact states that his home health nurse had mentioned this as a possibility as well. 07/23/2019 upon evaluation today patient actually appears to be doing very well in regard to his sacral wound. In fact this is much healthier the measurements not terribly different but again with a wound like this at home necessarily expect a a lot of change as far as the overall measurements are concerned in just 1 week's time. This is going be a much longer term process at this point. With that being said I do think that he is very healthy appearing as far as the base of the wound is concerned. 08/06/2019 upon evaluation today patient actually appears to be doing about the same with regard to his wound to be honest. There is really not a significant improvement overall based on what I am seeing today. Fortunately there is no signs of significant systemic infection. No fevers, chills, nausea, vomiting, or diarrhea. With that being said there is odor to the drainage from the wound and subsequently also what appears to be increased drainage based on what we are seeing today as well as what his home health nurse called [place] about that she was concerned with as well. 08/13/2019 upon evaluation today patient appears to be doing about the same at this point with regard to his wound although I think the dressing may be a little less drainage wise compared to what it was previous. Fortunately there is no signs of active infection at this time. No fevers, chills, nausea, vomiting, or diarrhea. He has been taking the antibiotic for only 3 days. 08/23/2019 upon evaluation today patient appears to be doing not nearly as well with regard to his wound. In general really not making a lot of progress which is unfortunate. There does not appear to be any  signs of active infection at this time. No fevers, chills, nausea, vomiting, or diarrhea. With that being said the patient unfortunately does seem to be experiencing continued epiboly  around the edges of the wound as well as significant scar tissue he has been in the majority of his day sitting in his chair which is likely a big reason for all of this. Fortunately there is no signs of active infection at this time. No fevers, chills, nausea, vomiting, or diarrhea. 08/29/2019 upon evaluation today patient's wound actually appears to be showing some signs of improvement. The region of few areas of new skin growth around the edges of the wound even where he has some of the epiboly. This is actually good news and I think that he has been very aggressive and offloading over the past week since we showed him the pictures of his wound last week. There still may be a chance that he is Bradley [name], Bradley [name]. ([id number]) going require some sharp debridement to clear away some of these edges but to be honest I think that is can be quite an undertaking. The main reason is that he has a lot of thickened scar tissue and it is good to bleed quite significantly in my opinion. The I think if organ to do that we may even need to have a portable or disposable cautery in order to make sure that we are able to get the area completely sealed up as far as bleeding is concerned. I do have one that we can utilize. However being that the wound looks so much better right now would like to give this 2 weeks to see how things stand and look at that point before making any additional recommendations. 09/12/2019 upon evaluation today patient appears to be making some progress as far as offloading is concerned. He still has a substantial wound but nonetheless he tells me he is off loading much more aggressively at this point. This is obviously good news. No fevers, chills, nausea, vomiting, or diarrhea. 4/22; I have not seen this patient in  quite some time. No major change in the condition of the wound or its wound volume. Surrounding maceration of the skin around the edges of the wound. The wound is fairly substantial. To close to the anal opening to consider a wound VAC. He had extensive trials of plastic surgery at [facility] which really does not result in any improvement. His wound bed is however clean 10/10/2019 upon evaluation today patient appears to be doing well with regard to the wound in the sacral region. He has been tolerating the dressing changes without complication. Fortunately there is no signs of infection and he actually has some epithelial growth noted as well. He tells me has been trying to continue to offload this area which is excellent that is what he needs to do most as far as what he can have in his control. Otherwise I feel like the collagen with a saline moistened gauze behind has been beneficial for him. 5/20; collagen and normal saline wet-to-dry. Although the tissue when this large sacral wound looks reasonable there is been absolutely no benefit to the overall closure. He has macerated skin around this. He tells me that he spends a large part of the day up in the wheelchair. He still has home health 11/07/2019 upon evaluation today patient appears to be doing well with regard to his wounds at this point. Fortunately there is no signs of active infection at this time. Overall I feel like his sacral wound in general appears to be doing quite nicely. I am very pleased with overall how things are progressing and I feel like the  patient is making excellent progress towards resolution. With that being said he still has a long ways to go before we expect to see this completely healed. 11/21/2019 upon evaluation today patient appears to be doing about the same in regard to his sacral wound. Unfortunately he is really not making much in the way of improvement when I questioned him about how much time he is really  spending sitting on his bottom he tells me that the majority of his day is mainly in that position though he does spend some time in bed. It does not sound like it is enough however neither does it look like it is enough. 12/24/2019 upon evaluation today patient appears to be doing really about the same there is no significant improvement overall in his wound bed. Last time he was seen here we did have a discussion about the possibility of seeing if I can get a surgeon to evaluate and potentially clear away some of the thickened edges of the wound in order to allow this wound to potentially have a chance of closing with a wound VAC. Right now we could not even get a wound VAC to seal with some of the rolled and thickened edges. With that being said elected to the patient to think about this to discuss today. At this point the patient does want to see if I could make the referral to potentially see if the surgeon would be able to perform this and subsequently he agrees to go to a skilled nursing facility following if they are able to do this in order to allow for a wound VAC and to get this wound to heal more effectively and quickly. In my opinion the only way that I would recommend that he see the surgeon to clear away the edges would be if he is also in agreement with going to skilled nursing facility as I know he cannot appropriately offload at home to take care of this and that setting. 01/07/2020 on evaluation today patient appears to be doing about the same in regard to his wound. There is no signs of active infection at this time which is great news. All in all he is really doing about the same. We have made referral to surgery to see if they can potentially perform debridement to clear away some of the scarred edging of the wound so we could potentially see about a wound VAC to try to help this heal more efficiently. Again we have discussed that if this occurs he would be in a skilled nursing  facility following or release would need to be in order to appropriately offload. Nonetheless we are still in the process of working that referral. 01/21/2020 upon evaluation today patient appears to be doing about the same in regard to his sacral wound. He did see Dr. Lysle Pearl Dr. Ines Bloomer feeling was that there was not anything surgically he could offer at this point as he did not feel like that a wound VAC would likely be appropriate for the patient. With that being said he did recommend potential for trying to get the patient into a skilled nursing facility as an outpatient and they will get me working on that as well. Fortunately there is no signs of active infection at this time. 11/10; this is a patient that has not been seen here in quite a bit of time while we were in our alternative venue. As well I have not seen this patient in probably over 2 years. As  I remember things with him he has had this pressure ulcer encompassing his lower sacrum and coccyx and the surrounding buttocks for a prolonged period of time. When he first came here the surface looked fairly good and the wound was smaller we attempted a wound VAC but we could never keep this in place. Because of nonresponse of the wound to topical dressings we referred him to [facility] plastic surgery where preparation was being made for flap closure. Intact he had a colostomy however after that he was deemed to have 2 large wound for flap closure. He did have osteomyelitis in the coccyx area in 2019. He is using wet-to-dry he has home health. They are managing his ostomy supplies as well. The wound is substantially larger than what I remember. Versus last time he was here to wound months or so ago he has an unstageable wound on the left upper thigh posteriorly. He tells me he is in his chair quite a bit especially recently 12/1; patient comes back to clinic. He did not get the lab work done apparently home health would not do this they  referred him to the lab where he is apparently having a CBC checked because he is on iron infusions. We will try to get the lab work I ordered done there. He also did not get the x-ray he says he does not remember the discussion. The issue is that this large wound has probing areas to bone. He may have underlying osteomyelitis again he was treated for this previously I think in 2019 12/15; the patient did have lab work done. Notable for iron deficiency anemia with a hemoglobin of 9.7 although he is following with hematology for this his CRP was 7.1 which is up from his previous value. sedimentation rate of 91 which is also higher. However he maintains a relatively high sedimentation rate. Most disturbingly was an albumin of 2.8 although this is also somewhat higher than the 2.2 we had previously. With some difficulty I think a home health nurse actually made him some Dakin's solution he is using a Dakin's wet-to-dry to the large sacral wound. He has the area on the left upper thigh as well 06/17/2020; I follow this patient on a monthly basis. 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 At one point he had underlying osteomyelitis here and he received antibiotic therapy. I believe this was done through [facility] We have been using palliative Dakin's solution wet-to-dry on this which is really This very large wound about the same with healthy surface. No active infection that is obvious. He has developed an area on the left posterior thigh and now a mirror image area on the right posterior thigh Bradley [name], Bradley A. ([id number]) noted pressure injuries from the wheelchair. This occurs even though he has a Roho cushion which is apparently new I have been forwarded lab work from 05/15/2020. At that point his hemoglobin was 9.2 down from 13.8 on 10/5 this is microcytic hypochromic. He has known iron deficiency although the exact cause of this is never really been clear to me. He follows with  hematology oncology and has been receiving IV iron he is supposed to follow-up in mid January and I told the patient this. From wound care point of view his albumin is 2.9 sedimentation rate is 74. His sedimentation rate has always been elevated in this range however his C-reactive protein was 82 which is way above anything previously measured. I see that  on 05/08/2020 this was 7.1 I am not sure why the rapid discrepancy. He is supposed to have an x-ray of his lower sacrum looking for evidence of osteomyelitis that he was previously treated for. Is possible he would also require more advanced imaging such as another C CT or MRI He comes in also with paperwork for a [facility] air-fluidized bed with a trapeze bar. I asked him if he would be able to transfer in and out of this bed . I am not opposed to ordering it, but I do not want to make his transfers more difficult than they already are. He has a [employer] fast streamer Objective Constitutional Sitting or standing Blood Pressure is within target range for patient.. Pulse regular and within target range for patient.Marland Kitchen Respirations regular, non- labored and within target range.. Temperature is normal and within the target range for the patient.Marland Kitchen appears in no distress. Vitals Time Taken: 10:50 AM, Temperature: 97.9 F, Pulse: 75 bpm, Respiratory Rate: 18 breaths/min, Blood Pressure: 151/73 mmHg. Gastrointestinal (GI) He has a colostomy. General Notes: Wound exam; the patient's large wound over the sacrum and surrounding buttocks has a clean looking surface this is deep but there is no exposed bone. Granulation tissue does not look unhealthy. He now has an area on the right posterior thigh a mirror image level to the 1 on the left posterior thigh we previously identified. Integumentary (Hair, Skin) Wound #10 status is Open. Original cause of wound was Pressure Injury. The wound is located on the Midline Sacrum. The wound measures 13.5cm length x 8cm width x  6.4cm depth; 84.823cm^2 area and 542.867cm^3 volume. There is bone, muscle, and Fat Layer (Subcutaneous Tissue) exposed. There is no tunneling or undermining noted. There is a large amount of serous drainage noted. The wound margin is epibole. There is large (67-100%) red, pink granulation within the wound bed. There is no necrotic tissue within the wound bed. Wound #12 status is Open. Original cause of wound was Gradually Appeared. The wound is located on the Left,Proximal Upper Leg. The wound measures 3.7cm length x 1.7cm width x 0.1cm depth; 4.94cm^2 area and 0.494cm^3 volume. There is Fat Layer (Subcutaneous Tissue) exposed. There is no tunneling or undermining noted. There is a medium amount of serosanguineous drainage noted. The wound margin is flat and intact. There is large (67-100%) red granulation within the wound bed. There is a small (1-33%) amount of necrotic tissue within the wound bed including Adherent Slough. Wound #13 status is Open. Original cause of wound was Gradually Appeared. The wound is located on the Right,Posterior Upper Leg. The wound measures 1cm length x 1cm width x 0.1cm depth; 0.785cm^2 area and 0.079cm^3 volume. There is Fat Layer (Subcutaneous Tissue) exposed. There is no tunneling or undermining noted. There is a small amount of serosanguineous drainage noted. There is large (67-100%) red granulation within the wound bed. There is a small (1-33%) amount of necrotic tissue within the wound bed including Adherent Slough. 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 Due to amount of drainage, need to order additional supplies for patient to change on days home health does not come out. [place] for  wound care. May utilize formulary equivalent dressing for wound treatment orders unless otherwise specified. Home Health Nurse may visit PRN to address patient s wound care needs. Scheduled days for dressing changes to be completed; exception, patient has scheduled wound care visit that day. **Please direct any NON-WOUND related issues/requests for orders to patient's Primary Care Physician. **If current dressing causes regression in wound condition, may D/C ordered dressing product/s and apply Normal Saline Moist Dressing daily until next [place] or Other MD appointment. **Notify [facility] of regression in wound condition at [phone]. Bathing/ Shower/ Hygiene: May shower; gently cleanse wound with antibacterial soap, rinse and pat dry prior to dressing wounds Off-Loading: Wound #10 Midline Sacrum: Hospital bed/mattress Air fluidized (Group 3) Turn and reposition every 2 hours Wound #12 Left,Proximal Upper Leg: Hospital bed/mattress Air fluidized (Group 3) Turn and reposition every 2 hours Wound #13 Right,Posterior Upper Leg: Hospital bed/mattress Air fluidized (Group 3) Turn and reposition every 2 hours Additional Orders / Instructions: Follow Nutritious Diet and Increase Protein Intake WOUND #10: - Sacrum Wound Laterality: Midline Cleanser: Dakin 16 (oz) 0.25 1 x Per Day/30 Days Discharge Instructions: Use as directed. Primary Dressing: Gauze 1 x Per Day/30 Days Discharge Instructions: As directed: dry, moistened with saline or moistened with Dakins Solution Secondary Dressing: ABD Pad 5x9 (in/in) 1 x Per Day/30 Days Discharge Instructions: Cover with ABD pad Secured With: 37M Medipore H Soft Cloth Surgical Tape, 2x2 (in/yd) 1 x Per Day/30 Days WOUND #12: - Upper Leg Wound Laterality: Left,  Proximal Cleanser: Dakin 16 (oz) 0.25 1 x Per Day/30 Days Discharge Instructions: Use as directed. Primary Dressing: Gauze 1 x Per Day/30 Days Discharge Instructions: As directed: dry, moistened with saline or moistened with Dakins Solution Secondary Dressing: ABD Pad 5x9 (in/in) 1 x Per Day/30 Days Discharge Instructions: Cover with ABD pad Secured With: 37M Medipore H Soft Cloth Surgical Tape, 2x2 (in/yd) 1 x Per Day/30 Days WOUND #13: - Upper Leg Wound Laterality: Right, Posterior Cleanser: Dakin 16 (oz) 0.25 1 x Per Day/30 Days Discharge Instructions: Use as directed. Primary Dressing: Gauze 1 x Per Day/30 Days Discharge Instructions: As directed: dry, moistened with saline or moistened with Dakins Solution Secondary Dressing: ABD Pad 5x9 (in/in) 1 x Per Day/30 Days Discharge Instructions: Cover with ABD pad Secured With: 37M Medipore H Soft Cloth Surgical Tape, 2x2 (in/yd) 1 x Per Day/30 Days 1. I have asked him to get the x-ray of the underlying sacral wound to look for evidence of progressive osteomyelitis this would dictate another round of antibiotics. 2. His inflammatory markers are elevated. His sedimentation rate is always been high but the C-reactive protein seems to have jumped in 1 week I really do not know what to make of that 3. He has iron deficiency anemia possibly with a some degree of anemia of chronic inflammation. He follows with hematology for this and I have asked him to follow up with hematology. The exact etiology of the iron deficiency anemia is not clear however the patient says that [facility] he was told this was "a hereditary cause" and that it would never get any better 4. I will sign his paperwork for the [facility] air-fluidized bed although I was concerned that this could hamper his transfers. The patient currently has an air mattress but has not made any progress in fact he has had further breakdown on his thighs. 5. He has hypoalbuminemia I have talked to him  about this his albumin is increasing gradually but  still at 2.9 Electronic Signature(s) Signed: 06/18/2020 12:50:01 PM By: Linton Ham MD Entered By: Linton Ham on 06/17/2020 12:01:04 [name] -------------------------------------------------------------------------------- SuperBill Details Patient Name: Bradley Loa A. Date of Service: 06/17/2020 Medical Record Number: [id number] Patient Account Number: [id number] Date of Birth/Sex: 1945-12-18 (75 y.o. 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 Facility Procedures CPT4 Code: [id number] Description: 99214 - WOUND CARE VISIT-LEV 4 EST PT Modifier: Quantity: 1 Physician Procedures CPT4 Code: [id number] Description: [id number] - WC PHYS LEVEL 4 - EST PT Modifier: Quantity: 1 CPT4 Code: Description: ICD-10 Diagnosis Description L89.154 Pressure ulcer of sacral region, stage 4 L97.128 Non-pressure chronic ulcer of left thigh with other specified severit L97.111 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

## 2020-06-20 NOTE — Progress Notes (Signed)
Bradley [name], Bradley [name] ([id number]) Visit Report for 06/17/2020 Arrival Information Details Patient Name: Bradley [name], Bradley [name]. Date of Service: 06/17/2020 11:00 AM Medical Record Number: [id number] Patient Account Number: [id number] Date of Birth/Sex: 1946-06-05 (74 y.o. M) Treating RN: Dolan Amen Primary Care Myca Perno: [name] Other Clinician: Referring Brookelin Felber: [name] Treating Karan Inclan/Extender: Tito Dine in Treatment: 5 Visit Information History Since Last Visit Pain Present Now: No Patient Arrived: Wheel Chair Arrival Time: 10:49 Accompanied By: self Transfer Assistance: [place] Patient Identification Verified: Yes Secondary Verification Process Completed: Yes Patient Requires Transmission-Based Precautions: No Patient Has Alerts: No Electronic Signature(s) Signed: 06/17/2020 4:32:07 PM By: Georges Mouse, Minus Breeding RN Entered By: Georges Mouse, Minus Breeding on 06/17/2020 10:50:08 Bradley [name] ([id number]) -------------------------------------------------------------------------------- Clinic Level of Care Assessment Details Patient Name: Bradley [name]. Date of Service: 06/17/2020 11:00 AM Medical Record Number: [id number] Patient Account Number: [id number] Date of Birth/Sex: 11/05/1945 (74 y.o. M) Treating RN: Cornell Barman Primary Care Zyier Dykema: [name] Other Clinician: Referring Karington Zarazua: [name] Treating Zed Wanninger/Extender: Tito Dine in Treatment: 76 Clinic Level of Care Assessment Items TOOL 4 Quantity Score []  - Use when only an EandM is performed on FOLLOW-UP visit 0 ASSESSMENTS - Nursing Assessment / Reassessment X - Reassessment of Co-morbidities (includes updates in patient status) 1 10 X- 1 5 Reassessment of Adherence to Treatment Plan ASSESSMENTS - Wound and Skin Assessment / Reassessment []  - Simple Wound Assessment / Reassessment - one wound 0 X- 3 5 Complex Wound Assessment / Reassessment -  multiple wounds []  - 0 Dermatologic / Skin Assessment (not related to wound area) ASSESSMENTS - Focused Assessment []  - Circumferential Edema Measurements - multi extremities 0 []  - 0 Nutritional Assessment / Counseling / Intervention []  - 0 Lower Extremity Assessment (monofilament, tuning fork, pulses) []  - 0 Peripheral Arterial Disease Assessment (using hand held doppler) ASSESSMENTS - Ostomy and/or Continence Assessment and Care []  - Incontinence Assessment and Management 0 []  - 0 Ostomy Care Assessment and Management (repouching, etc.) PROCESS - Coordination of Care X - Simple Patient / Family Education for ongoing care 1 15 []  - 0 Complex (extensive) Patient / Family Education for ongoing care X- 1 10 Staff obtains Programmer, systems, Records, Test Results / Process Orders []  - 0 Staff telephones HHA, Nursing Homes / Clarify orders / etc []  - 0 Routine Transfer to another Facility (non-emergent condition) []  - 0 Routine Hospital Admission (non-emergent condition) []  - 0 New Admissions / Biomedical engineer / Ordering NPWT, Apligraf, etc. []  - 0 Emergency Hospital Admission (emergent condition) X- 1 10 Simple Discharge Coordination []  - 0 Complex (extensive) Discharge Coordination PROCESS - Special Needs []  - Pediatric / Minor Patient Management 0 []  - 0 Isolation Patient Management []  - 0 Hearing / Language / Visual special needs []  - 0 Assessment of Community assistance (transportation, D/C planning, etc.) []  - 0 Additional assistance / Altered mentation []  - 0 Support Surface(s) Assessment (bed, cushion, seat, etc.) INTERVENTIONS - Wound Cleansing / Measurement WYNN, POCIASK A. ([id number]) []  - 0 Simple Wound Cleansing - one wound X- 3 5 Complex Wound Cleansing - multiple wounds X- 1 5 Wound Imaging (photographs - any number of wounds) []  - 0 Wound Tracing (instead of photographs) []  - 0 Simple Wound Measurement - one wound X- 3 5 Complex Wound  Measurement - multiple wounds INTERVENTIONS - Wound Dressings []  - Small Wound Dressing one or multiple wounds 0 X- 2 15 Medium Wound Dressing one or multiple wounds X- 1 20  Large Wound Dressing one or multiple wounds []  - 0 Application of Medications - topical []  - 0 Application of Medications - injection INTERVENTIONS - Miscellaneous []  - External ear exam 0 []  - 0 Specimen Collection (cultures, biopsies, blood, body fluids, etc.) []  - 0 Specimen(s) / Culture(s) sent or taken to Lab for analysis []  - 0 Patient Transfer (multiple staff / Civil Service fast streamer / Similar devices) []  - 0 Simple Staple / Suture removal (25 or less) []  - 0 Complex Staple / Suture removal (26 or more) []  - 0 Hypo / Hyperglycemic Management (close monitor of Blood Glucose) []  - 0 Ankle / Brachial Index (ABI) - do not check if billed separately X- 1 5 Vital Signs Has the patient been seen at the hospital within the last three years: Yes Total Score: 155 Level Of Care: New/Established - Level 4 Electronic Signature(s) Signed: 06/20/2020 10:48:52 AM By: Gretta Cool, BSN, RN, CWS, Kim RN, BSN Entered By: Gretta Cool, BSN, RN, CWS, Kim on 06/17/2020 11:39:58 Bradley [name] ([id number]) -------------------------------------------------------------------------------- Encounter Discharge Information Details Patient Name: Bradley Loa A. Date of Service: 06/17/2020 11:00 AM Medical Record Number: [id number] Patient Account Number: [id number] Date of Birth/Sex: [birth year] (74 y.o. M) Treating RN: Cornell Barman Primary Care Tytus Strahle: [name] Other Clinician: Referring Strother Everitt: [name] Treating Saksham Akkerman/Extender: Tito Dine in Treatment: 58 Encounter Discharge Information Items Discharge Condition: Stable Ambulatory Status: Wheelchair Discharge Destination: Home Transportation: Private Auto Accompanied By: self Schedule Follow-up Appointment: Yes Clinical Summary of Care: Electronic  Signature(s) Signed: 06/20/2020 10:48:52 AM By: Gretta Cool, BSN, RN, CWS, Kim RN, BSN Entered By: Gretta Cool, BSN, RN, CWS, Kim on 06/17/2020 11:41:03 Bradley [name] ([id number]) -------------------------------------------------------------------------------- Lower Extremity Assessment Details Patient Name: Bradley [name], Bradley A. Date of Service: 06/17/2020 11:00 AM Medical Record Number: [id number] Patient Account Number: [id number] Date of Birth/Sex: Mar 17, 1946 (74 y.o. M) Treating RN: Dolan Amen Primary Care Eason Housman: [name] Other Clinician: Referring Correen Bubolz: [name] Treating Terryann Verbeek/Extender: Tito Dine in Treatment: 42 Electronic Signature(s) Signed: 06/17/2020 4:32:07 PM By: Georges Mouse, Minus Breeding RN Entered By: Georges Mouse, Minus Breeding on 06/17/2020 11:17:10 Bradley [name] ([id number]) -------------------------------------------------------------------------------- Multi Wound Chart Details Patient Name: Bradley Loa A. Date of Service: 06/17/2020 11:00 AM Medical Record Number: [id number] Patient Account Number: [id number] Date of Birth/Sex: 1946/03/31 (74 y.o. M) Treating RN: Cornell Barman Primary Care [employer]: [name] Other Clinician: Referring Anandi Abramo: [name] Treating Kareema Keitt/Extender: Tito Dine in Treatment: 65 Vital Signs Height(in): Pulse(bpm): 29 Weight(lbs): Blood Pressure(mmHg): 151/73 Body Mass Index(BMI): Temperature(F): 97.9 Respiratory Rate(breaths/min): 18 Photos: Wound Location: Midline Sacrum Left, Proximal Upper Leg Right, Posterior Upper Leg Wounding Event: Pressure Injury Gradually Appeared Gradually Appeared Primary Etiology: Pressure Ulcer Pressure Ulcer Pressure Ulcer Comorbid History: Anemia, Hypertension, History of Anemia, Hypertension, History of Anemia, Hypertension, History of pressure wounds, Rheumatoid pressure wounds, Rheumatoid pressure wounds, Rheumatoid Arthritis,  Paraplegia Arthritis, Paraplegia Arthritis, Paraplegia Date Acquired: 06/07/2015 03/06/2020 06/17/2020 Weeks of Treatment: 48 9 0 Wound Status: Open Open Open Measurements L x W x D (cm) 13.5x8x6.4 3.7x1.7x0.1 1x1x0.1 Area (cm) : 84.823 4.94 0.785 Volume (cm) : 542.867 0.494 0.079 % Reduction in Area: -64.60% 68.60% 0.00% % Reduction in Volume: -75.60% 68.60% 0.00% Classification: Category/Stage IV Unstageable/Unclassified Category/Stage II Exudate Amount: Large Medium Small Exudate Type: Serous Serosanguineous Serosanguineous Exudate Color: amber red, brown red, brown Wound Margin: Epibole Flat and Intact N/A Granulation Amount: Large (67-100%) Large (67-100%) Large (67-100%) Granulation Quality: Red, Pink Red Red Necrotic Amount: None Present (0%) Small (1-33%) Small (1-33%)  Exposed Structures: Fat Layer (Subcutaneous Tissue): Fat Layer (Subcutaneous Tissue): Fat Layer (Subcutaneous Tissue): Yes Yes Yes Muscle: Yes Fascia: No Fascia: No Bone: Yes Tendon: No Tendon: No Fascia: No Muscle: No Muscle: No Tendon: No Joint: No Joint: No Joint: No Bone: No Bone: No Epithelialization: None None None Treatment Notes Electronic Signature(s) Signed: 06/18/2020 12:50:01 PM By: Linton Ham MD Entered By: Linton Ham on 06/17/2020 11:40:16 Bradley [name] ([id number]) -------------------------------------------------------------------------------- [place] Details Patient Name: Bradley Loa A. Date of Service: 06/17/2020 11:00 AM Medical Record Number: [id number] Patient Account Number: [id number] Date of Birth/Sex: 04/28/1946 (74 y.o. M) Treating RN: Cornell Barman Primary Care Tameca Jerez: [name] Other Clinician: Referring Jamaury Gumz: [name] Treating Juanell Saffo/Extender: Tito Dine in Treatment: 18 Active Inactive Abuse / Safety / Falls / Self Care Management Nursing Diagnoses: Impaired physical mobility Goals: Patient  will not develop complications from immobility Date Initiated: 07/16/2019 Target Resolution Date: 02/07/2020 Goal Status: Active Interventions: Assess Activities of Daily Living upon admission and as needed Notes: Pressure Nursing Diagnoses: Knowledge deficit related to causes and risk factors for pressure ulcer development Goals: Patient will remain free from development of additional pressure ulcers Date Initiated: 07/16/2019 Target Resolution Date: 02/07/2020 Goal Status: Active Interventions: Provide education on pressure ulcers Notes: Electronic Signature(s) Signed: 06/20/2020 10:48:52 AM By: Gretta Cool, BSN, RN, CWS, Kim RN, BSN Entered By: Gretta Cool, BSN, RN, CWS, Kim on 06/17/2020 11:28:37 Bradley [name] ([id number]) -------------------------------------------------------------------------------- Pain Assessment Details Patient Name: Bradley Loa A. Date of Service: 06/17/2020 11:00 AM Medical Record Number: [id number] Patient Account Number: [id number] Date of Birth/Sex: 1946/01/24 (74 y.o. M) Treating RN: Dolan Amen Primary Care Jaxxson Cavanah: [name] Other Clinician: Referring Briscoe Daniello: [name] Treating Maribeth Jiles/Extender: Tito Dine in Treatment: 1 Active Problems Location of Pain Severity and Description of Pain Patient Has Paino No Site Locations Rate the pain. Current Pain Level: 0 Pain Management and Medication Current Pain Management: Electronic Signature(s) Signed: 06/17/2020 4:32:07 PM By: Georges Mouse, Minus Breeding RN Entered By: Georges Mouse, Ken[place] on 06/17/2020 10:59:16 Bradley [name] ([id number]) -------------------------------------------------------------------------------- Patient/Caregiver Education Details Patient Name: Bradley Loa A. Date of Service: 06/17/2020 11:00 AM Medical Record Number: [id number] Patient Account Number: [id number] Date of Birth/Gender: [birth year] (74 y.o. M) Treating RN: Cornell Barman Primary Care  Physician: [name] Other Clinician: Referring Physician: [name] Treating Physician/Extender: Tito Dine in Treatment: 105 Education Assessment Education Provided To: Patient Education Topics Provided Pressure: Handouts: Pressure Ulcers: Care and Offloading, Preventing Pressure Ulcers Methods: Demonstration, Explain/Verbal Responses: See progress note, State content correctly Electronic Signature(s) Signed: 06/20/2020 10:48:52 AM By: Gretta Cool, BSN, RN, CWS, Kim RN, BSN Entered By: Gretta Cool, BSN, RN, CWS, Kim on 06/17/2020 11:40:24 Bradley [name] ([id number]) -------------------------------------------------------------------------------- Wound Assessment Details Patient Name: Bradley [name], Bradley A. Date of Service: 06/17/2020 11:00 AM Medical Record Number: [id number] Patient Account Number: [id number] Date of Birth/Sex: 11-30-1945 (74 y.o. M) Treating RN: Dolan Amen Primary Care Jeffree Cazeau: [name] Other Clinician: Referring Tiffannie Sloss: [name] Treating Lashe Oliveira/Extender: Tito Dine in Treatment: 48 Wound Status Wound Number: 10 Primary Pressure Ulcer Etiology: Wound Location: Midline Sacrum Wound Open Wounding Event: Pressure Injury Status: Date Acquired: 06/07/2015 Comorbid Anemia, Hypertension, History of pressure wounds, Weeks Of Treatment: 48 History: Rheumatoid Arthritis, Paraplegia Clustered Wound: No Photos Wound Measurements Length: (cm) 13.5 Width: (cm) 8 Depth: (cm) 6.4 Area: (cm) 84.823 Volume: (cm) 542.867 % Reduction in Area: -64.6% % Reduction in Volume: -75.6% Epithelialization: None Tunneling: No Undermining: No Wound Description Classification: Category/Stage IV  Foul Wound Margin: Epibole Sloug Exudate Amount: Large Exudate Type: Serous Exudate Color: amber Odor After Cleansing: No h/Fibrino Yes Wound Bed Granulation Amount: Large (67-100%) Exposed Structure Granulation Quality: Red,  Pink Fascia Exposed: No Necrotic Amount: None Present (0%) Fat Layer (Subcutaneous Tissue) Exposed: Yes Tendon Exposed: No Muscle Exposed: Yes Necrosis of Muscle: No Joint Exposed: No Bone Exposed: Yes Treatment Notes Wound #10 (Sacrum) Wound Laterality: Midline Cleanser Dakin 16 (oz) 0.25 Discharge Instruction: Use as directed. Bradley [name], Bradley [name] ([id number]) Peri-Wound Care Topical Primary Dressing Gauze Discharge Instruction: As directed: dry, moistened with saline or moistened with Dakins Solution Secondary Dressing ABD Pad 5x9 (in/in) Discharge Instruction: Cover with ABD pad Secured With 69M Medipore H Soft Cloth Surgical Tape, 2x2 (in/yd) Compression Wrap Compression Stockings Add-Ons Electronic Signature(s) Signed: 06/17/2020 4:32:07 PM By: Charlett Nose RN Signed: 06/20/2020 10:48:52 AM By: Gretta Cool, BSN, RN, CWS, Kim RN, BSN Entered By: Gretta Cool, BSN, RN, CWS, Kim on 06/17/2020 11:34:49 Bradley [name] ([id number]) -------------------------------------------------------------------------------- Wound Assessment Details Patient Name: Bradley [name], Bradley A. Date of Service: 06/17/2020 11:00 AM Medical Record Number: [id number] Patient Account Number: [id number] Date of Birth/Sex: [birth year] (74 y.o. M) Treating RN: Dolan Amen Primary Care Kiowa Peifer: [name] Other Clinician: Referring Staceyann Knouff: [name] Treating Demitris Pokorny/Extender: Tito Dine in Treatment: 48 Wound Status Wound Number: 12 Primary Pressure Ulcer Etiology: Wound Location: Left, Proximal Upper Leg Wound Open Wounding Event: Gradually Appeared Status: Date Acquired: 03/06/2020 Comorbid Anemia, Hypertension, History of pressure wounds, Weeks Of Treatment: 9 History: Rheumatoid Arthritis, Paraplegia Clustered Wound: No Photos Wound Measurements Length: (cm) 3.7 Width: (cm) 1.7 Depth: (cm) 0.1 Area: (cm) 4.94 Volume: (cm) 0.494 % Reduction in Area: 68.6% %  Reduction in Volume: 68.6% Epithelialization: None Tunneling: No Undermining: No Wound Description Classification: Unstageable/Unclassified Wound Margin: Flat and Intact Exudate Amount: Medium Exudate Type: Serosanguineous Exudate Color: red, brown Foul Odor After Cleansing: No Slough/Fibrino Yes Wound Bed Granulation Amount: Large (67-100%) Exposed Structure Granulation Quality: Red Fascia Exposed: No Necrotic Amount: Small (1-33%) Fat Layer (Subcutaneous Tissue) Exposed: Yes Necrotic Quality: Adherent Slough Tendon Exposed: No Muscle Exposed: No Joint Exposed: No Bone Exposed: No Treatment Notes Wound #12 (Upper Leg) Wound Laterality: Left, Proximal Cleanser Dakin 16 (oz) 0.25 Discharge Instruction: Use as directed. Peri-Wound Care Bradley [name], Bradley [name] ([id number]) Topical Primary Dressing Gauze Discharge Instruction: As directed: dry, moistened with saline or moistened with Dakins Solution Secondary Dressing ABD Pad 5x9 (in/in) Discharge Instruction: Cover with ABD pad Secured With 69M [place] Surgical Tape, 2x2 (in/yd) Compression Wrap Compression Stockings Add-Ons Electronic Signature(s) Signed: 06/17/2020 4:32:07 PM By: Georges Mouse, Minus Breeding RN Entered By: Georges Mouse, Minus Breeding on 06/17/2020 11:18:24 Bradley [name] ([id number]) -------------------------------------------------------------------------------- Wound Assessment Details Patient Name: Bradley Loa A. Date of Service: 06/17/2020 11:00 AM Medical Record Number: [id number] Patient Account Number: [id number] Date of Birth/Sex: Jan 21, 1946 (74 y.o. M) Treating RN: Dolan Amen Primary Care Hollynn Garno: [name] Other Clinician: Referring Meyer Arora: [name] Treating Alyssa Mancera/Extender: Tito Dine in Treatment: 39 Wound Status Wound Number: 13 Primary Pressure Ulcer Etiology: Wound Location: Right, Posterior Upper Leg Wound Open Wounding Event: Gradually  Appeared Status: Date Acquired: 06/17/2020 Comorbid Anemia, Hypertension, History of pressure wounds, Weeks Of Treatment: 0 History: Rheumatoid Arthritis, Paraplegia Clustered Wound: No Photos Wound Measurements Length: (cm) 1 Width: (cm) 1 Depth: (cm) 0.1 Area: (cm) 0.785 Volume: (cm) 0.079 % Reduction in Area: 0% % Reduction in Volume: 0% Epithelialization: None Tunneling: No Undermining: No Wound Description Classification: Category/Stage II Exudate Amount:  Small Exudate Type: Serosanguineous Exudate Color: red, brown Foul Odor After Cleansing: No Slough/Fibrino Yes Wound Bed Granulation Amount: Large (67-100%) Exposed Structure Granulation Quality: Red Fascia Exposed: No Necrotic Amount: Small (1-33%) Fat Layer (Subcutaneous Tissue) Exposed: Yes Necrotic Quality: Adherent Slough Tendon Exposed: No Muscle Exposed: No Joint Exposed: No Bone Exposed: No Treatment Notes Wound #13 (Upper Leg) Wound Laterality: Right, Posterior Cleanser Dakin 16 (oz) 0.25 Discharge Instruction: Use as directed. Peri-Wound Care Bradley [name], Bradley [name] ([id number]) Topical Primary Dressing Gauze Discharge Instruction: As directed: dry, moistened with saline or moistened with Dakins Solution Secondary Dressing ABD Pad 5x9 (in/in) Discharge Instruction: Cover with ABD pad Secured With 24M [place] Surgical Tape, 2x2 (in/yd) Compression Wrap Compression Stockings Add-Ons Electronic Signature(s) Signed: 06/17/2020 4:32:07 PM By: Georges Mouse, Minus Breeding RN Entered By: Georges Mouse, Minus Breeding on 06/17/2020 11:18:50 Bradley [name] ([id number]) -------------------------------------------------------------------------------- [place] Details Patient Name: Bradley Loa A. Date of Service: 06/17/2020 11:00 AM Medical Record Number: [id number] Patient Account Number: [id number] Date of Birth/Sex: 03-Dec-1945 (74 y.o. M) Treating RN: Dolan Amen Primary Care Timmya Blazier: [name] Other Clinician: Referring Gram Siedlecki: [name] Treating Juliona Vales/Extender: Tito Dine in Treatment: 48 Vital Signs Time Taken: 10:50 Temperature (F): 97.9 Pulse (bpm): 75 Respiratory Rate (breaths/min): 18 Blood Pressure (mmHg): 151/73 Reference Range: 80 - 120 mg / dl Electronic Signature(s) Signed: 06/17/2020 4:32:07 PM By: Georges Mouse, Minus Breeding RN Entered By: Georges Mouse, Minus Breeding on 06/17/2020 10:58:34

## 2020-06-24 DIAGNOSIS — G8220 Paraplegia, unspecified: Secondary | ICD-10-CM | POA: Diagnosis not present

## 2020-06-24 DIAGNOSIS — N401 Enlarged prostate with lower urinary tract symptoms: Secondary | ICD-10-CM | POA: Diagnosis not present

## 2020-06-24 DIAGNOSIS — S24104S Unspecified injury at T11-T12 level of thoracic spinal cord, sequela: Secondary | ICD-10-CM | POA: Diagnosis not present

## 2020-06-24 DIAGNOSIS — N39498 Other specified urinary incontinence: Secondary | ICD-10-CM | POA: Diagnosis not present

## 2020-06-24 DIAGNOSIS — I10 Essential (primary) hypertension: Secondary | ICD-10-CM | POA: Diagnosis not present

## 2020-06-24 DIAGNOSIS — D509 Iron deficiency anemia, unspecified: Secondary | ICD-10-CM | POA: Diagnosis not present

## 2020-06-24 DIAGNOSIS — N319 Neuromuscular dysfunction of bladder, unspecified: Secondary | ICD-10-CM | POA: Diagnosis not present

## 2020-06-24 DIAGNOSIS — J309 Allergic rhinitis, unspecified: Secondary | ICD-10-CM | POA: Diagnosis not present

## 2020-06-24 DIAGNOSIS — L89154 Pressure ulcer of sacral region, stage 4: Secondary | ICD-10-CM | POA: Diagnosis not present

## 2020-06-26 DIAGNOSIS — L89154 Pressure ulcer of sacral region, stage 4: Secondary | ICD-10-CM | POA: Diagnosis not present

## 2020-06-26 DIAGNOSIS — J309 Allergic rhinitis, unspecified: Secondary | ICD-10-CM | POA: Diagnosis not present

## 2020-06-26 DIAGNOSIS — N39498 Other specified urinary incontinence: Secondary | ICD-10-CM | POA: Diagnosis not present

## 2020-06-26 DIAGNOSIS — N401 Enlarged prostate with lower urinary tract symptoms: Secondary | ICD-10-CM | POA: Diagnosis not present

## 2020-06-26 DIAGNOSIS — I10 Essential (primary) hypertension: Secondary | ICD-10-CM | POA: Diagnosis not present

## 2020-06-26 DIAGNOSIS — S24104S Unspecified injury at T11-T12 level of thoracic spinal cord, sequela: Secondary | ICD-10-CM | POA: Diagnosis not present

## 2020-06-26 DIAGNOSIS — N319 Neuromuscular dysfunction of bladder, unspecified: Secondary | ICD-10-CM | POA: Diagnosis not present

## 2020-06-26 DIAGNOSIS — G8220 Paraplegia, unspecified: Secondary | ICD-10-CM | POA: Diagnosis not present

## 2020-06-26 DIAGNOSIS — D509 Iron deficiency anemia, unspecified: Secondary | ICD-10-CM | POA: Diagnosis not present

## 2020-06-29 ENCOUNTER — Other Ambulatory Visit: Payer: Self-pay

## 2020-06-29 DIAGNOSIS — N401 Enlarged prostate with lower urinary tract symptoms: Secondary | ICD-10-CM | POA: Diagnosis not present

## 2020-06-29 DIAGNOSIS — E349 Endocrine disorder, unspecified: Secondary | ICD-10-CM

## 2020-06-29 DIAGNOSIS — D509 Iron deficiency anemia, unspecified: Secondary | ICD-10-CM | POA: Diagnosis not present

## 2020-06-29 DIAGNOSIS — G8220 Paraplegia, unspecified: Secondary | ICD-10-CM | POA: Diagnosis not present

## 2020-06-29 DIAGNOSIS — L89154 Pressure ulcer of sacral region, stage 4: Secondary | ICD-10-CM | POA: Diagnosis not present

## 2020-06-29 DIAGNOSIS — I10 Essential (primary) hypertension: Secondary | ICD-10-CM | POA: Diagnosis not present

## 2020-06-29 DIAGNOSIS — N319 Neuromuscular dysfunction of bladder, unspecified: Secondary | ICD-10-CM | POA: Diagnosis not present

## 2020-06-29 DIAGNOSIS — J309 Allergic rhinitis, unspecified: Secondary | ICD-10-CM | POA: Diagnosis not present

## 2020-06-29 DIAGNOSIS — S24104S Unspecified injury at T11-T12 level of thoracic spinal cord, sequela: Secondary | ICD-10-CM | POA: Diagnosis not present

## 2020-06-29 DIAGNOSIS — N39498 Other specified urinary incontinence: Secondary | ICD-10-CM | POA: Diagnosis not present

## 2020-06-30 ENCOUNTER — Other Ambulatory Visit: Payer: Self-pay

## 2020-06-30 ENCOUNTER — Other Ambulatory Visit: Payer: Medicare HMO

## 2020-06-30 DIAGNOSIS — L89154 Pressure ulcer of sacral region, stage 4: Secondary | ICD-10-CM | POA: Diagnosis not present

## 2020-06-30 DIAGNOSIS — N39498 Other specified urinary incontinence: Secondary | ICD-10-CM | POA: Diagnosis not present

## 2020-06-30 DIAGNOSIS — N319 Neuromuscular dysfunction of bladder, unspecified: Secondary | ICD-10-CM | POA: Diagnosis not present

## 2020-06-30 DIAGNOSIS — S24104S Unspecified injury at T11-T12 level of thoracic spinal cord, sequela: Secondary | ICD-10-CM | POA: Diagnosis not present

## 2020-06-30 DIAGNOSIS — E349 Endocrine disorder, unspecified: Secondary | ICD-10-CM

## 2020-06-30 DIAGNOSIS — G8220 Paraplegia, unspecified: Secondary | ICD-10-CM | POA: Diagnosis not present

## 2020-06-30 DIAGNOSIS — D509 Iron deficiency anemia, unspecified: Secondary | ICD-10-CM | POA: Diagnosis not present

## 2020-06-30 DIAGNOSIS — N401 Enlarged prostate with lower urinary tract symptoms: Secondary | ICD-10-CM | POA: Diagnosis not present

## 2020-06-30 DIAGNOSIS — I10 Essential (primary) hypertension: Secondary | ICD-10-CM | POA: Diagnosis not present

## 2020-06-30 DIAGNOSIS — J309 Allergic rhinitis, unspecified: Secondary | ICD-10-CM | POA: Diagnosis not present

## 2020-07-01 DIAGNOSIS — S24104S Unspecified injury at T11-T12 level of thoracic spinal cord, sequela: Secondary | ICD-10-CM | POA: Diagnosis not present

## 2020-07-01 DIAGNOSIS — D509 Iron deficiency anemia, unspecified: Secondary | ICD-10-CM | POA: Diagnosis not present

## 2020-07-01 DIAGNOSIS — I10 Essential (primary) hypertension: Secondary | ICD-10-CM | POA: Diagnosis not present

## 2020-07-01 DIAGNOSIS — G8220 Paraplegia, unspecified: Secondary | ICD-10-CM | POA: Diagnosis not present

## 2020-07-01 DIAGNOSIS — N319 Neuromuscular dysfunction of bladder, unspecified: Secondary | ICD-10-CM | POA: Diagnosis not present

## 2020-07-01 DIAGNOSIS — L89154 Pressure ulcer of sacral region, stage 4: Secondary | ICD-10-CM | POA: Diagnosis not present

## 2020-07-01 DIAGNOSIS — N401 Enlarged prostate with lower urinary tract symptoms: Secondary | ICD-10-CM | POA: Diagnosis not present

## 2020-07-01 DIAGNOSIS — N39498 Other specified urinary incontinence: Secondary | ICD-10-CM | POA: Diagnosis not present

## 2020-07-01 DIAGNOSIS — J309 Allergic rhinitis, unspecified: Secondary | ICD-10-CM | POA: Diagnosis not present

## 2020-07-01 LAB — PSA: Prostate Specific Ag, Serum: 11.9 ng/mL — ABNORMAL HIGH (ref 0.0–4.0)

## 2020-07-02 ENCOUNTER — Ambulatory Visit: Payer: Medicare HMO | Admitting: Urology

## 2020-07-02 DIAGNOSIS — M869 Osteomyelitis, unspecified: Secondary | ICD-10-CM | POA: Diagnosis not present

## 2020-07-02 DIAGNOSIS — L89154 Pressure ulcer of sacral region, stage 4: Secondary | ICD-10-CM | POA: Diagnosis not present

## 2020-07-02 DIAGNOSIS — G8220 Paraplegia, unspecified: Secondary | ICD-10-CM | POA: Diagnosis not present

## 2020-07-03 DIAGNOSIS — L89154 Pressure ulcer of sacral region, stage 4: Secondary | ICD-10-CM | POA: Diagnosis not present

## 2020-07-03 DIAGNOSIS — G8220 Paraplegia, unspecified: Secondary | ICD-10-CM | POA: Diagnosis not present

## 2020-07-03 DIAGNOSIS — S24104S Unspecified injury at T11-T12 level of thoracic spinal cord, sequela: Secondary | ICD-10-CM | POA: Diagnosis not present

## 2020-07-03 DIAGNOSIS — N39498 Other specified urinary incontinence: Secondary | ICD-10-CM | POA: Diagnosis not present

## 2020-07-03 DIAGNOSIS — N401 Enlarged prostate with lower urinary tract symptoms: Secondary | ICD-10-CM | POA: Diagnosis not present

## 2020-07-03 DIAGNOSIS — D509 Iron deficiency anemia, unspecified: Secondary | ICD-10-CM | POA: Diagnosis not present

## 2020-07-03 DIAGNOSIS — I10 Essential (primary) hypertension: Secondary | ICD-10-CM | POA: Diagnosis not present

## 2020-07-03 DIAGNOSIS — N319 Neuromuscular dysfunction of bladder, unspecified: Secondary | ICD-10-CM | POA: Diagnosis not present

## 2020-07-03 DIAGNOSIS — J309 Allergic rhinitis, unspecified: Secondary | ICD-10-CM | POA: Diagnosis not present

## 2020-07-06 DIAGNOSIS — L89154 Pressure ulcer of sacral region, stage 4: Secondary | ICD-10-CM | POA: Diagnosis not present

## 2020-07-06 DIAGNOSIS — S24104S Unspecified injury at T11-T12 level of thoracic spinal cord, sequela: Secondary | ICD-10-CM | POA: Diagnosis not present

## 2020-07-06 DIAGNOSIS — J309 Allergic rhinitis, unspecified: Secondary | ICD-10-CM | POA: Diagnosis not present

## 2020-07-06 DIAGNOSIS — G8220 Paraplegia, unspecified: Secondary | ICD-10-CM | POA: Diagnosis not present

## 2020-07-06 DIAGNOSIS — N39498 Other specified urinary incontinence: Secondary | ICD-10-CM | POA: Diagnosis not present

## 2020-07-06 DIAGNOSIS — N319 Neuromuscular dysfunction of bladder, unspecified: Secondary | ICD-10-CM | POA: Diagnosis not present

## 2020-07-06 DIAGNOSIS — I10 Essential (primary) hypertension: Secondary | ICD-10-CM | POA: Diagnosis not present

## 2020-07-06 DIAGNOSIS — D509 Iron deficiency anemia, unspecified: Secondary | ICD-10-CM | POA: Diagnosis not present

## 2020-07-06 DIAGNOSIS — N401 Enlarged prostate with lower urinary tract symptoms: Secondary | ICD-10-CM | POA: Diagnosis not present
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
## 2020-07-07 ENCOUNTER — Ambulatory Visit (INDEPENDENT_AMBULATORY_CARE_PROVIDER_SITE_OTHER): Payer: Medicare HMO | Admitting: Urology

## 2020-07-07 ENCOUNTER — Other Ambulatory Visit: Payer: Self-pay

## 2020-07-07 ENCOUNTER — Encounter: Payer: Self-pay | Admitting: Urology

## 2020-07-07 ENCOUNTER — Telehealth: Payer: Self-pay | Admitting: Urology

## 2020-07-07 VITALS — BP 119/68 | HR 91 | Ht 72.0 in | Wt 212.0 lb

## 2020-07-07 DIAGNOSIS — N476 Balanoposthitis: Secondary | ICD-10-CM

## 2020-07-07 DIAGNOSIS — R972 Elevated prostate specific antigen [PSA]: Secondary | ICD-10-CM

## 2020-07-07 DIAGNOSIS — E349 Endocrine disorder, unspecified: Secondary | ICD-10-CM

## 2020-07-07 DIAGNOSIS — N319 Neuromuscular dysfunction of bladder, unspecified: Secondary | ICD-10-CM

## 2020-07-07 DIAGNOSIS — R310 Gross hematuria: Secondary | ICD-10-CM | POA: Diagnosis not present

## 2020-07-07 LAB — URINALYSIS, COMPLETE
Bilirubin, UA: NEGATIVE
Glucose, UA: NEGATIVE
Ketones, UA: NEGATIVE
Nitrite, UA: NEGATIVE
Protein,UA: NEGATIVE
Specific Gravity, UA: 1.02 (ref 1.005–1.030)
Urobilinogen, Ur: 0.2 mg/dL (ref 0.2–1.0)
pH, UA: 5.5 (ref 5.0–7.5)

## 2020-07-07 LAB — MICROSCOPIC EXAMINATION

## 2020-07-07 NOTE — Telephone Encounter (Signed)
Please let Bradley [name] know that his urine was negative for microscopic hematuria.

## 2020-07-07 NOTE — Telephone Encounter (Signed)
Patient notified and voiced understanding.

## 2020-07-08 DIAGNOSIS — N39498 Other specified urinary incontinence: Secondary | ICD-10-CM | POA: Diagnosis not present

## 2020-07-08 DIAGNOSIS — N319 Neuromuscular dysfunction of bladder, unspecified: Secondary | ICD-10-CM | POA: Diagnosis not present

## 2020-07-08 DIAGNOSIS — I10 Essential (primary) hypertension: Secondary | ICD-10-CM | POA: Diagnosis not present

## 2020-07-08 DIAGNOSIS — S24104S Unspecified injury at T11-T12 level of thoracic spinal cord, sequela: Secondary | ICD-10-CM | POA: Diagnosis not present

## 2020-07-08 DIAGNOSIS — N401 Enlarged prostate with lower urinary tract symptoms: Secondary | ICD-10-CM | POA: Diagnosis not present

## 2020-07-08 DIAGNOSIS — G8220 Paraplegia, unspecified: Secondary | ICD-10-CM | POA: Diagnosis not present

## 2020-07-08 DIAGNOSIS — L89154 Pressure ulcer of sacral region, stage 4: Secondary | ICD-10-CM | POA: Diagnosis not present

## 2020-07-08 DIAGNOSIS — J309 Allergic rhinitis, unspecified: Secondary | ICD-10-CM | POA: Diagnosis not present

## 2020-07-08 DIAGNOSIS — D509 Iron deficiency anemia, unspecified: Secondary | ICD-10-CM | POA: Diagnosis not present

## 2020-07-10 DIAGNOSIS — D509 Iron deficiency anemia, unspecified: Secondary | ICD-10-CM | POA: Diagnosis not present

## 2020-07-10 DIAGNOSIS — N39498 Other specified urinary incontinence: Secondary | ICD-10-CM | POA: Diagnosis not present

## 2020-07-10 DIAGNOSIS — G8220 Paraplegia, unspecified: Secondary | ICD-10-CM | POA: Diagnosis not present

## 2020-07-10 DIAGNOSIS — N319 Neuromuscular dysfunction of bladder, unspecified: Secondary | ICD-10-CM | POA: Diagnosis not present

## 2020-07-10 DIAGNOSIS — S24104S Unspecified injury at T11-T12 level of thoracic spinal cord, sequela: Secondary | ICD-10-CM | POA: Diagnosis not present

## 2020-07-10 DIAGNOSIS — L89154 Pressure ulcer of sacral region, stage 4: Secondary | ICD-10-CM | POA: Diagnosis not present

## 2020-07-10 DIAGNOSIS — I10 Essential (primary) hypertension: Secondary | ICD-10-CM | POA: Diagnosis not present

## 2020-07-10 DIAGNOSIS — L89893 Pressure ulcer of other site, stage 3: Secondary | ICD-10-CM | POA: Diagnosis not present

## 2020-07-10 DIAGNOSIS — N401 Enlarged prostate with lower urinary tract symptoms: Secondary | ICD-10-CM | POA: Diagnosis not present

## 2020-07-13 DIAGNOSIS — L89893 Pressure ulcer of other site, stage 3: Secondary | ICD-10-CM | POA: Diagnosis not present

## 2020-07-13 DIAGNOSIS — M869 Osteomyelitis, unspecified: Secondary | ICD-10-CM | POA: Diagnosis not present

## 2020-07-13 DIAGNOSIS — D509 Iron deficiency anemia, unspecified: Secondary | ICD-10-CM | POA: Diagnosis not present

## 2020-07-13 DIAGNOSIS — N401 Enlarged prostate with lower urinary tract symptoms: Secondary | ICD-10-CM | POA: Diagnosis not present

## 2020-07-13 DIAGNOSIS — N319 Neuromuscular dysfunction of bladder, unspecified: Secondary | ICD-10-CM | POA: Diagnosis not present

## 2020-07-13 DIAGNOSIS — S24104S Unspecified injury at T11-T12 level of thoracic spinal cord, sequela: Secondary | ICD-10-CM | POA: Diagnosis not present

## 2020-07-13 DIAGNOSIS — I10 Essential (primary) hypertension: Secondary | ICD-10-CM | POA: Diagnosis not present

## 2020-07-13 DIAGNOSIS — N39498 Other specified urinary incontinence: Secondary | ICD-10-CM | POA: Diagnosis not present

## 2020-07-13 DIAGNOSIS — L89154 Pressure ulcer of sacral region, stage 4: Secondary | ICD-10-CM | POA: Diagnosis not present

## 2020-07-13 DIAGNOSIS — G8220 Paraplegia, unspecified: Secondary | ICD-10-CM | POA: Diagnosis not present

## 2020-07-15 ENCOUNTER — Encounter: Payer: Medicare HMO | Attending: Internal Medicine | Admitting: Internal Medicine

## 2020-07-15 ENCOUNTER — Other Ambulatory Visit: Payer: Self-pay

## 2020-07-15 DIAGNOSIS — L89153 Pressure ulcer of sacral region, stage 3: Secondary | ICD-10-CM | POA: Diagnosis not present

## 2020-07-15 DIAGNOSIS — L97111 Non-pressure chronic ulcer of right thigh limited to breakdown of skin: Secondary | ICD-10-CM | POA: Diagnosis not present

## 2020-07-15 DIAGNOSIS — Z933 Colostomy status: Secondary | ICD-10-CM | POA: Diagnosis not present

## 2020-07-15 DIAGNOSIS — L89154 Pressure ulcer of sacral region, stage 4: Secondary | ICD-10-CM | POA: Insufficient documentation

## 2020-07-15 DIAGNOSIS — G8220 Paraplegia, unspecified: Secondary | ICD-10-CM | POA: Insufficient documentation

## 2020-07-15 DIAGNOSIS — M069 Rheumatoid arthritis, unspecified: Secondary | ICD-10-CM | POA: Insufficient documentation

## 2020-07-15 DIAGNOSIS — L97122 Non-pressure chronic ulcer of left thigh with fat layer exposed: Secondary | ICD-10-CM | POA: Diagnosis not present

## 2020-07-17 DIAGNOSIS — N39498 Other specified urinary incontinence: Secondary | ICD-10-CM | POA: Diagnosis not present

## 2020-07-17 DIAGNOSIS — D509 Iron deficiency anemia, unspecified: Secondary | ICD-10-CM | POA: Diagnosis not present

## 2020-07-17 DIAGNOSIS — S24104S Unspecified injury at T11-T12 level of thoracic spinal cord, sequela: Secondary | ICD-10-CM | POA: Diagnosis not present

## 2020-07-17 DIAGNOSIS — I10 Essential (primary) hypertension: Secondary | ICD-10-CM | POA: Diagnosis not present

## 2020-07-17 DIAGNOSIS — L89154 Pressure ulcer of sacral region, stage 4: Secondary | ICD-10-CM | POA: Diagnosis not present

## 2020-07-17 DIAGNOSIS — N319 Neuromuscular dysfunction of bladder, unspecified: Secondary | ICD-10-CM | POA: Diagnosis not present

## 2020-07-17 DIAGNOSIS — L89893 Pressure ulcer of other site, stage 3: Secondary | ICD-10-CM | POA: Diagnosis not present

## 2020-07-17 DIAGNOSIS — G8220 Paraplegia, unspecified: Secondary | ICD-10-CM | POA: Diagnosis not present

## 2020-07-17 DIAGNOSIS — N401 Enlarged prostate with lower urinary tract symptoms: Secondary | ICD-10-CM | POA: Diagnosis not present

## 2020-07-18 NOTE — Progress Notes (Signed)
STERLIN, KNIGHTLY ([id number]) Visit Report for 07/15/2020 Arrival Information Details Patient Name: Bradley [name], Bradley [name]. Date of Service: 07/15/2020 11:15 AM Medical Record Number: [id number] Patient Account Number: [id number] Date of Birth/Sex: April 07, 1946 (75 y.o. M) Treating RN: Carlene Coria Primary Care Tuere Nwosu: [name] Other Clinician: Referring Haddy Mullinax: [name] Treating Josie Mesa/Extender: Tito Dine in Treatment: 18 Visit Information History Since Last Visit All ordered tests and consults were completed: No Patient Arrived: Wheel Chair Added or deleted any medications: No Arrival Time: 11:43 Any new allergies or adverse reactions: No Accompanied By: self Had a fall or experienced change in No Transfer Assistance: None activities of daily living that may affect Patient Identification Verified: Yes risk of falls: Secondary Verification Process Completed: Yes Signs or symptoms of abuse/neglect since last visito No Patient Requires Transmission-Based Precautions: No Hospitalized since last visit: No Patient Has Alerts: No Implantable device outside of the clinic excluding No cellular tissue based products placed in the center since last visit: Has Dressing in Place as Prescribed: Yes Pain Present Now: No Electronic Signature(s) Signed: 07/16/2020 5:17:48 PM By: Carlene Coria RN Entered By: Carlene Coria on 07/15/2020 11:43:26 [name] ([id number]) -------------------------------------------------------------------------------- Clinic Level of Care Assessment Details Patient Name: [name] Date of Service: 07/15/2020 11:15 AM Medical Record Number: [id number] Patient Account Number: [id number] Date of Birth/Sex: Mar 01, 1946 (75 y.o. M) Treating RN: Cornell Barman Primary Care Joshus Rogan: [name] Other Clinician: Referring Saoirse Legere: [name] Treating Mykiah Schmuck/Extender: Tito Dine in Treatment: 15 Clinic Level  of Care Assessment Items TOOL 4 Quantity Score []  - Use when only an EandM is performed on FOLLOW-UP visit 0 ASSESSMENTS - Nursing Assessment / Reassessment X - Reassessment of Co-morbidities (includes updates in patient status) 1 10 X- 1 5 Reassessment of Adherence to Treatment Plan ASSESSMENTS - Wound and Skin Assessment / Reassessment []  - Simple Wound Assessment / Reassessment - one wound 0 X- 2 5 Complex Wound Assessment / Reassessment - multiple wounds []  - 0 Dermatologic / Skin Assessment (not related to wound area) ASSESSMENTS - Focused Assessment []  - Circumferential Edema Measurements - multi extremities 0 []  - 0 Nutritional Assessment / Counseling / Intervention []  - 0 Lower Extremity Assessment (monofilament, tuning fork, pulses) []  - 0 Peripheral Arterial Disease Assessment (using hand held doppler) ASSESSMENTS - Ostomy and/or Continence Assessment and Care []  - Incontinence Assessment and Management 0 []  - 0 Ostomy Care Assessment and Management (repouching, etc.) PROCESS - Coordination of Care X - Simple Patient / Family Education for ongoing care 1 15 []  - 0 Complex (extensive) Patient / Family Education for ongoing care []  - 0 Staff obtains Programmer, systems, Records, Test Results / Process Orders []  - 0 Staff telephones HHA, Nursing Homes / Clarify orders / etc []  - 0 Routine Transfer to another Facility (non-emergent condition) []  - 0 Routine Hospital Admission (non-emergent condition) []  - 0 New Admissions / Biomedical engineer / Ordering NPWT, Apligraf, etc. []  - 0 Emergency Hospital Admission (emergent condition) X- 1 10 Simple Discharge Coordination []  - 0 Complex (extensive) Discharge Coordination PROCESS - Special Needs []  - Pediatric / Minor Patient Management 0 []  - 0 Isolation Patient Management []  - 0 Hearing / Language / Visual special needs []  - 0 Assessment of Community assistance (transportation, D/C planning, etc.) []  - 0 Additional  assistance / Altered mentation []  - 0 Support Surface(s) Assessment (bed, cushion, seat, etc.) INTERVENTIONS - Wound Cleansing / Measurement JARRICK, FJELD A. ([id number]) []  - 0 Simple Wound Cleansing -  one wound X- 2 5 Complex Wound Cleansing - multiple wounds X- 1 5 Wound Imaging (photographs - any number of wounds) []  - 0 Wound Tracing (instead of photographs) []  - 0 Simple Wound Measurement - one wound X- 2 5 Complex Wound Measurement - multiple wounds INTERVENTIONS - Wound Dressings []  - Small Wound Dressing one or multiple wounds 0 X- 1 15 Medium Wound Dressing one or multiple wounds X- 1 20 Large Wound Dressing one or multiple wounds []  - 0 Application of Medications - topical []  - 0 Application of Medications - injection INTERVENTIONS - Miscellaneous []  - External ear exam 0 []  - 0 Specimen Collection (cultures, biopsies, blood, body fluids, etc.) []  - 0 Specimen(s) / Culture(s) sent or taken to Lab for analysis X- 1 10 Patient Transfer (multiple staff / Civil Service fast streamer / Similar devices) []  - 0 Simple Staple / Suture removal (25 or less) []  - 0 Complex Staple / Suture removal (26 or more) []  - 0 Hypo / Hyperglycemic Management (close monitor of Blood Glucose) []  - 0 Ankle / Brachial Index (ABI) - do not check if billed separately X- 1 5 Vital Signs Has the patient been seen at the hospital within the last three years: Yes Total Score: 125 Level Of Care: New/Established - Level 4 Electronic Signature(s) Signed: 07/16/2020 8:38:29 AM By: Gretta Cool, BSN, RN, CWS, Kim RN, BSN Entered By: Gretta Cool, BSN, RN, CWS, Kim on 07/15/2020 12:58:52 [name] ([id number]) -------------------------------------------------------------------------------- Encounter Discharge Information Details Patient Name: AVREY, HYSER A. Date of Service: 07/15/2020 11:15 AM Medical Record Number: [id number] Patient Account Number: [id number] Date of Birth/Sex: April 03, 1946 (75 y.o. M) Treating  RN: Cornell Barman Primary Care Michole Lecuyer: [name] Other Clinician: Referring Dragan Tamburrino: [name] Treating Cruz Bong/Extender: Tito Dine in Treatment: 86 Encounter Discharge Information Items Discharge Condition: Stable Ambulatory Status: Wheelchair Discharge Destination: Home Transportation: Private Auto Accompanied By: self Schedule Follow-up Appointment: Yes Clinical Summary of Care: Electronic Signature(s) Signed: 07/15/2020 1:03:55 PM By: Gretta Cool, BSN, RN, CWS, Kim RN, BSN Entered By: Gretta Cool, BSN, RN, CWS, Kim on 07/15/2020 13:03:55 [name] ([id number]) -------------------------------------------------------------------------------- Lower Extremity Assessment Details Patient Name: SABINO, DENNING A. Date of Service: 07/15/2020 11:15 AM Medical Record Number: [id number] Patient Account Number: [id number] Date of Birth/Sex: September 19, 1945 (75 y.o. M) Treating RN: Carlene Coria Primary Care Maelle Sheaffer: [name] Other Clinician: Referring Sohail Capraro: [name] Treating Arienne Gartin/Extender: Tito Dine in Treatment: 75 Electronic Signature(s) Signed: 07/16/2020 5:17:48 PM By: Carlene Coria RN Entered By: Carlene Coria on 07/15/2020 11:48:11 [name] ([id number]) -------------------------------------------------------------------------------- Multi Wound Chart Details Patient Name: [name] Date of Service: 07/15/2020 11:15 AM Medical Record Number: [id number] Patient Account Number: [id number] Date of Birth/Sex: 01/11/1946 (75 y.o. M) Treating RN: Carlene Coria Primary Care Jlon Betker: [name] Other Clinician: Referring Deirdre Gryder: [name] Treating Rosealee Recinos/Extender: Tito Dine in Treatment: 28 Vital Signs Height(in): Pulse(bpm): 59 Weight(lbs): Blood Pressure(mmHg): 123/74 Body Mass Index(BMI): Temperature(F): 98.23 Respiratory Rate(breaths/min): 18 Photos: [13:No Photos] Wound  Location: Midline Sacrum Left, Proximal Upper Leg Right, Posterior Upper Leg Wounding Event: Pressure Injury Gradually Appeared Gradually Appeared Primary Etiology: Pressure Ulcer Pressure Ulcer Pressure Ulcer Comorbid History: Anemia, Hypertension, History of Anemia, Hypertension, History of Anemia, Hypertension, History of pressure wounds, Rheumatoid pressure wounds, Rheumatoid pressure wounds, Rheumatoid Arthritis, Paraplegia Arthritis, Paraplegia Arthritis, Paraplegia Date Acquired: 06/07/2015 03/06/2020 06/17/2020 Weeks of Treatment: 52 13 4 Wound Status: Open Open Open Measurements L x W x D (cm) 12x7x5 3x1.7x0.3 0x0x0 Area (cm) : 65.973  4.006 0 Volume (cm) : 329.867 1.202 0 % Reduction in Area: -28.00% 74.50% 100.00% % Reduction in Volume: -6.70% 23.50% 100.00% Classification: Category/Stage IV Unstageable/Unclassified Category/Stage II Exudate Amount: Large Medium None Present Exudate Type: Serous Serosanguineous N/A Exudate Color: amber red, brown N/A Wound Margin: Epibole Flat and Intact N/A Granulation Amount: Large (67-100%) Large (67-100%) None Present (0%) Granulation Quality: Red, Pink Red N/A Necrotic Amount: None Present (0%) Small (1-33%) None Present (0%) Exposed Structures: Fat Layer (Subcutaneous Tissue): Fat Layer (Subcutaneous Tissue): Fascia: No Yes Yes Fat Layer (Subcutaneous Tissue): Muscle: Yes Fascia: No No Bone: Yes Tendon: No Tendon: No Fascia: No Muscle: No Muscle: No Tendon: No Joint: No Joint: No Joint: No Bone: No Bone: No Epithelialization: None None None Treatment Notes Electronic Signature(s) Signed: 07/18/2020 5:30:54 AM By: Linton Ham MD Entered By: Linton Ham on 07/15/2020 12:25:19 [name] ([id number]) -------------------------------------------------------------------------------- Multi-Disciplinary Care Plan Details Patient Name: GORDY, GOAR A. Date of Service: 07/15/2020 11:15 AM Medical Record Number:  [id number] Patient Account Number: [id number] Date of Birth/Sex: 04-24-1946 (75 y.o. M) Treating RN: Carlene Coria Primary Care Tonie Vizcarrondo: [name] Other Clinician: Referring Darlis Wragg: [name] Treating Thaily Hackworth/Extender: Tito Dine in Treatment: 29 Active Inactive Abuse / Safety / Falls / Self Care Management Nursing Diagnoses: Impaired physical mobility Goals: Patient will not develop complications from immobility Date Initiated: 07/16/2019 Target Resolution Date: 02/07/2020 Goal Status: Active Interventions: Assess Activities of Daily Living upon admission and as needed Notes: Pressure Nursing Diagnoses: Knowledge deficit related to causes and risk factors for pressure ulcer development Goals: Patient will remain free from development of additional pressure ulcers Date Initiated: 07/16/2019 Target Resolution Date: 02/07/2020 Goal Status: Active Interventions: Provide education on pressure ulcers Notes: Electronic Signature(s) Signed: 07/16/2020 5:17:48 PM By: Carlene Coria RN Entered By: Carlene Coria on 07/15/2020 12:00:11 [name] ([id number]) -------------------------------------------------------------------------------- Pain Assessment Details Patient Name: [name] Date of Service: 07/15/2020 11:15 AM Medical Record Number: [id number] Patient Account Number: [id number] Date of Birth/Sex: 1946-04-25 (75 y.o. M) Treating RN: Carlene Coria Primary Care Delmy Holdren: [name] Other Clinician: Referring Berdene Askari: [name] Treating Hazelyn Kallen/Extender: Tito Dine in Treatment: 34 Active Problems Location of Pain Severity and Description of Pain Patient Has Paino No Site Locations Pain Management and Medication Current Pain Management: Electronic Signature(s) Signed: 07/16/2020 5:17:48 PM By: Carlene Coria RN Entered By: Carlene Coria on 07/15/2020 11:44:17 [name]  ([id number]) -------------------------------------------------------------------------------- Patient/Caregiver Education Details Patient Name: [name] Date of Service: 07/15/2020 11:15 AM Medical Record Number: [id number] Patient Account Number: [id number] Date of Birth/Gender: [birth year] (75 y.o. M) Treating RN: Cornell Barman Primary Care Physician: [name] Other Clinician: Referring Physician: [name] Treating Physician/Extender: Tito Dine in Treatment: 62 Education Assessment Education Provided To: Patient Education Topics Provided Pressure: Handouts: Pressure Ulcers: Care and Offloading Methods: Demonstration, Explain/Verbal Responses: State content correctly Electronic Signature(s) Signed: 07/16/2020 8:38:29 AM By: Gretta Cool, BSN, RN, CWS, Kim RN, BSN Entered By: Gretta Cool, BSN, RN, CWS, Kim on 07/15/2020 13:03:14 [name] ([id number]) -------------------------------------------------------------------------------- Wound Assessment Details Patient Name: CONAN, MCMANAWAY A. Date of Service: 07/15/2020 11:15 AM Medical Record Number: [id number] Patient Account Number: [id number] Date of Birth/Sex: July 25, 1945 (75 y.o. M) Treating RN: Carlene Coria Primary Care Brenden Rudman: [name] Other Clinician: Referring Charlye Spare: [name] Treating Wells Mabe/Extender: Tito Dine in Treatment: 64 Wound Status Wound Number: 10 Primary Pressure Ulcer Etiology: Wound Location: Midline Sacrum Wound Open Wounding Event: Pressure Injury Status: Date Acquired: 06/07/2015 Comorbid Anemia,  Hypertension, History of pressure wounds, Weeks Of Treatment: 52 History: Rheumatoid Arthritis, Paraplegia Clustered Wound: No Photos Wound Measurements Length: (cm) 12 Width: (cm) 7 Depth: (cm) 5 Area: (cm) 65.973 Volume: (cm) 329.867 % Reduction in Area: -28% % Reduction in Volume: -6.7% Epithelialization: None Tunneling:  No Undermining: No Wound Description Classification: Category/Stage IV Wound Margin: Epibole Exudate Amount: Large Exudate Type: Serous Exudate Color: amber Foul Odor After Cleansing: No Slough/Fibrino Yes Wound Bed Granulation Amount: Large (67-100%) Exposed Structure Granulation Quality: Red, Pink Fascia Exposed: No Necrotic Amount: None Present (0%) Fat Layer (Subcutaneous Tissue) Exposed: Yes Tendon Exposed: No Muscle Exposed: Yes Necrosis of Muscle: No Joint Exposed: No Bone Exposed: Yes Treatment Notes Wound #10 (Sacrum) Wound Laterality: Midline Cleanser Vashe 5.8 (oz) Discharge Instruction: Use vashe 5.8 (oz) as directed [name], TILLETT ([id number]) Peri-Wound Care Topical Primary Dressing Gauze Discharge Instruction: moistened with Vashe and packed lightly into wound bed Secondary Dressing Xtrasorb Large 6x9 (in/in) Discharge Instruction: needed due to excessive drainage. ABD 8 x 10 Discharge Instruction: needed due to excessive drainage Secured With 38M Medipore H Soft Cloth Surgical Tape, 2x2 (in/yd) Compression Wrap Compression Stockings Add-Ons Electronic Signature(s) Signed: 07/16/2020 5:17:48 PM By: Carlene Coria RN Entered By: Carlene Coria on 07/15/2020 11:47:06 [name] ([id number]) -------------------------------------------------------------------------------- Wound Assessment Details Patient Name: [name] Date of Service: 07/15/2020 11:15 AM Medical Record Number: [id number] Patient Account Number: [id number] Date of Birth/Sex: 1945-09-24 (75 y.o. M) Treating RN: Carlene Coria Primary Care Ruchy Wildrick: [name] Other Clinician: Referring Alethia Melendrez: [name] Treating Cerina Leary/Extender: Tito Dine in Treatment: 18 Wound Status Wound Number: 12 Primary Pressure Ulcer Etiology: Wound Location: Left, Proximal Upper Leg Wound Open Wounding Event: Gradually Appeared Status: Date Acquired:  03/06/2020 Comorbid Anemia, Hypertension, History of pressure wounds, Weeks Of Treatment: 13 History: Rheumatoid Arthritis, Paraplegia Clustered Wound: No Photos Wound Measurements Length: (cm) 3 Width: (cm) 1.7 Depth: (cm) 0.3 Area: (cm) 4.006 Volume: (cm) 1.202 % Reduction in Area: 74.5% % Reduction in Volume: 23.5% Epithelialization: None Tunneling: No Undermining: No Wound Description Classification: Unstageable/Unclassified Wound Margin: Flat and Intact Exudate Amount: Medium Exudate Type: Serosanguineous Exudate Color: red, brown Foul Odor After Cleansing: No Slough/Fibrino Yes Wound Bed Granulation Amount: Large (67-100%) Exposed Structure Granulation Quality: Red Fascia Exposed: No Necrotic Amount: Small (1-33%) Fat Layer (Subcutaneous Tissue) Exposed: Yes Necrotic Quality: Adherent Slough Tendon Exposed: No Muscle Exposed: No Joint Exposed: No Bone Exposed: No Treatment Notes Wound #12 (Upper Leg) Wound Laterality: Left, Proximal Cleanser Vashe 5.8 (oz) Discharge Instruction: Use vashe 5.8 (oz) as directed Peri-Wound Care EBON, KETCHUM ([id number]) Topical Primary Dressing Gauze Discharge Instruction: moistened with Vashe and packed lightly into wound bed Secondary Dressing ABD Pad 5x9 (in/in) Xtrasorb Medium 4x5 (in/in) Discharge Instruction: Apply to wound as directed. Do not cut. Secured With 38M Medipore H Soft Cloth Surgical Tape, 2x2 (in/yd) Compression Wrap Compression Stockings Add-Ons Electronic Signature(s) Signed: 07/16/2020 5:17:48 PM By: Carlene Coria RN Entered By: Carlene Coria on 07/15/2020 11:47:37 [name] ([id number]) -------------------------------------------------------------------------------- Wound Assessment Details Patient Name: [name] Date of Service: 07/15/2020 11:15 AM Medical Record Number: [id number] Patient Account Number: [id number] Date of Birth/Sex: [birth year] (75 y.o. M) Treating RN: Carlene Coria Primary Care Jensyn Cambria: [name] Other Clinician: Referring Torra Pala: [name] Treating Veronica Fretz/Extender: Tito Dine in Treatment: 81 Wound Status Wound Number: 13 Primary Pressure Ulcer Etiology: Wound Location: Right, Posterior Upper Leg Wound Open Wounding Event: Gradually Appeared Status: Date Acquired: 06/17/2020 Comorbid Anemia, Hypertension,  History of pressure wounds, Weeks Of Treatment: 4 History: Rheumatoid Arthritis, Paraplegia Clustered Wound: No Wound Measurements Length: (cm) 0 Width: (cm) 0 Depth: (cm) 0 Area: (cm) 0 Volume: (cm) 0 % Reduction in Area: 100% % Reduction in Volume: 100% Epithelialization: None Tunneling: No Undermining: No Wound Description Classification: Category/Stage II Exudate Amount: None Present Foul Odor After Cleansing: No Slough/Fibrino No Wound Bed Granulation Amount: None Present (0%) Exposed Structure Necrotic Amount: None Present (0%) Fascia Exposed: No Fat Layer (Subcutaneous Tissue) Exposed: No Tendon Exposed: No Muscle Exposed: No Joint Exposed: No Bone Exposed: No Electronic Signature(s) Signed: 07/16/2020 5:17:48 PM By: Carlene Coria RN Entered By: Carlene Coria on 07/15/2020 11:48:02 [name] ([id number]) -------------------------------------------------------------------------------- [place] Details Patient Name: [name] Date of Service: 07/15/2020 11:15 AM Medical Record Number: [id number] Patient Account Number: [id number] Date of Birth/Sex: [birth year] (75 y.o. M) Treating RN: Carlene Coria Primary Care Jaqwon Manfred: [name] Other Clinician: Referring Demetrion Wesby: [name] Treating Rhiannan Kievit/Extender: Tito Dine in Treatment: 70 Vital Signs Time Taken: 11:43 Temperature (F): 98.23 Pulse (bpm): 74 Respiratory Rate (breaths/min): 18 Blood Pressure (mmHg): 123/74 Reference Range: 80 - 120 mg / dl Electronic Signature(s) Signed:  07/16/2020 5:17:48 PM By: Carlene Coria RN Entered By: Carlene Coria on 07/15/2020 11:44:08

## 2020-07-18 NOTE — Progress Notes (Signed)
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 increase in drainage over the past 2 weeks. He does change his dressings independently. He admits to sitting in his motorized chair for no more than 2-3 hours at which time he transfers to bed and rotates lateral position. 05/10/16; Jashun Puertas returns today for review of his stage III sacral pressure ulcer. He denies any concerns over the last 2 weeks although he seems to be running out of  Aquacel Ag and on those days he uses Medihoney. He has [facility] was supplying his dressings. He still complains of drainage. He does his dressings independently. He has in his motorized chair for 2-3 hours that time other than that he offloads this. Dimensions of the wound are down 1 cm in both directions. He underwent an aggressive debridement on his last visit of thick circumferential skin and subcutaneous tissue. It is possible at some point in the future he is going to need this done again 05/24/16; the patient returns today for review of his stage III sacral pressure  ulcer. We have been using Aquacel Ag he tells me that he changes this up to twice a day. I'm not really certain of the reason for this frequency of changing. He has some involvement from the home health nurses but I think is doing most of the changing himself which I think because of his paraplegia would be a very difficult exercise. Nevertheless he states that there is "wetness". I am not sure if there is another dressing that we could easily changed that much. I'd wanted to change to [facility] but I'll need to have a sense of how frequent he would need to change this. 06/14/16; this is a patient returns for review of his stage III sacral pressure ulcer. We have been using Aquacel Ag and over the last 2 visits he has had extensive debridement so of the thick circumferential skin and subcutaneous tissue that surrounds this wound. In spite of this really absolutely no change in the condition of the wound warrants measurements. We have [facility] home health I believe changing the dressing on 3 occasions the patient states he does this on one occasion himself 06/28/16; this is a patient who has a fairly large stage III sacral pressure ulcer. I changed him to [facility] from Aquacel 2 weeks ago. He YAEL, ANGERER ([id number]) returns today in follow-up. In the meantime a nurse from [facility] has calledrequesting  ordering of a wound VAC. He had this discussion before. The problem is the proximity of the lowest edge of this wound to the patient's anal opening roughly 3/4 of an inch. Can't see how this can be arranged. Apparently the nurse who is calling has a lot of experience, the question would be then when she is not available would be doing this. I would not have thought that this wound is not amenable to a wound VAC because of this reason 07/12/16; the patient comes in today and I have signed orders for a wound VAC. The home health team through [facility] is convinced that he can benefit from this even though there is close proximity to his anal opening beneath the gluteal clefts. The patient does not have a bowel regimen but states he has a bowel movement every 2 days this will also provide some problem with regards to the vac seal 07/26/16; the patient never did obtain a [place] wound VAC as he could not afford the $200 per month co-pay we have been using Hydrofera Blue now for 6 weeks or so. No major change in this wound at all. He is still not interested in the concept of plastic surgery. There changing the dressing every second day 08/09/16; the patient arrives with a wound precisely in the same situation. In keeping with the plan I outlined last time extensive debridement with an open curet the surface of this is not completely viable. Still has some degree of surrounding thick skin and subcutaneous tissue. No evidence of infection. Once again I have had a conversation with him about plastic surgery, he is simply not interested. 08/23/16; wound is really no different. Thick circumferential skin and subcutaneous tissue around the wound edge which is a lot better from debridement we did earlier in the year. The surface of the wound looks viable however with a curet there is definitely a gritty surface to this. We use Medihoney for a while, he could not afford Santyl. I don't think we could get a supply of  Iodoflex. He talks a little more positively about the concept of  plastic surgery which I've gone over with him today 08/31/16;; patient arrives in clinic today with the wound surface really no different there is no changes in dimensions. I debrided today surface on the left upper side of this wound aggressively week ago there is no real change here no evidence of epithelialization. The problem with debridement in the clinic is that he believes from this very liberally. We have been using Sorbact. 09/21/16; absolutely no change in the appearance or measurements of this wound. More recently I've been debrided in this aggressively and using sorbact to see if we could get to a better wound surface. Although this visually looks satisfactory, debridement reveals a very gritty surface to this. However even with this debridement and removal of thick nonviable skin and subcutaneous tissue from around the large amount of the circumference of this wound we have made absolutely no progress. This may be an offloading issue I'm just not completely certain. It has 2 close proximity in its inferior aspect to consider negative pressure therapy 10/26/16; READMISSION This patient called our clinic yesterday to report an odor in his wound. He had been to see plastic surgery at [facility] at our request after his last visit on 09/21/16; we have been seeing him for several months with a large stage III wound. He had been sent to general surgery for consideration of a colostomy, that appointment was not until mid June He comes in today with a temperature of 101. He is reporting an odor in the wound since last weekend. 01/10/17 Readmission: 01/10/17 On evaluation today it is noted that patient has been seen by plastic surgery at [facility] since he was last evaluated here. They did discuss with him the possibility of a flap according to the notes but unfortunately at this point he was not quite ready to proceed with surgery and instead wanted  to give the Wound VAC a try. In the hospital they were able to get a good seal on the Wound VAC. Unfortunately since that time they have been having trouble in regard to his current home health company keeping a simple on the Wound VAC. He would like to switch to a different home health company. With that being said it sounds as if the problem is that his wound VAC is not feeling at the lower portion of his back and he tells me that he can take some of the clear plastic and put over that area when the sill breaks and it will correct it for time. He has no discomfort or pain which is good news. He has been treated with IV vancomycin since he was last seen here and has an appointment with a infectious disease specialist in two days on 01/12/17. Otherwise he was transferred back to [place] for continuing to monitor and manage is wound as she progresses with a Wound VAC for the time being. 01/17/17 on evaluation today patient continues to show evidence of slight improvement with the Wound VAC fortunately there's no evidence of infection or otherwise worsening condition in general. Nonetheless we were unable to get him switch to [facility] in regard to home help from his current company. I'm not sure the reasoning behind but for some reason he was not accepted as a patient with him. Continue to apply the Wound VAC which does still show that some maceration around the wound edges but the wound measurements were slightly improved. No fevers, chills, nausea, or vomiting noted at this time. 02/14/17; this patient I have not  seen in 5 months although he has been readmitted to our clinic seen by our physician assistant Jeri Cos twice in early August. I have looked through [facility] notes care everywhere. The patient saw plastic surgery in May [Dr. Bhatt}. The patient was sent to general surgery and ultimately had a colostomy placed. On 11/29/16. This was after he was admitted to [facility] sometime in May. An MRI of  the pelvis on 5/23 showed osteomyelitis of the coccyx. An attempt was made to drain fluid that was not successful. He was treated with empiric broad-spectrum antibiotics VAC/cefepime/Flagyl starting on 11/02/16 with plans for a 6 week course. According to their notes he was sent to a nursing home. Was last seen by Dr. Myriam Jacobson of plastic surgery on 12/28/16. The first part of the note is a long dissertation about the difficulties finding adequate patients for flap closure of pressure ulcers. At that time the wound was noted to be stage IV based I think on underlying infection no exposed bone and healthy granulation tissue. 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 He was last seen by infectious disease 01/12/17 A Dr. Uvaldo Rising. His note says that Mr. Schank was not interested in a flap closure for referring a trial of the wound VAC. As previously anticipated the wound VAC could not be maintained as an outpatient in the community. He is now using something similar to a Dakin's wet to dry recommended by [facility] VASHE solution. He is placing this twice a day himself. This is almost s hopeless setting in terms of heeling 02/28/17; he is using a Dakin's wet to dry. Most of the wound surface looks satisfactory however the deeper area over his coccyx now has exposed bone I'm not sure if I noted this last week. 03/21/17; patient is usingVASHE solution wet to dry which I gather is a variation on Dakin solution. He has home health changing this 3 times a week the other days he does this himself. His appointment with plastic surgery 04/18/17; patient continues to use a variant of Dakin solution I believe. His wound continues to have a clean viable surface. The 2 areas of exposed bone in the center of this wound had closed over. He has an appointment with plastic surgery on December 5 at which time I hope that there'll be a plan for myocutaneous flap closure In looking through [place] link I couldn't find any more plastic surgery appointments. I did come across the fact that he is been followed by hematology for a microcytic hypochromic anemia. He had a reasonably normal looking hemoglobin electrophoresis. His iron level was 10 and according to the patient he is going for IV iron infusions starting tomorrow. He had a sedimentation rate of 74. More problematically from a pure wound care point of view his albumin was 2.7 earlier this month 05/17/17; this is a patient I follow monthly. He has a large now stage IV wound over his bilateral buttocks with close proximity to his anal ATHARV, BARRIERE A. ([id number]) opening. More recently he has developed a large area with exposed bone in the center of this probably secondary to the underlying osteomyelitis E had in the summer. He also follows with Dr. Myriam Jacobson at [facility] who is plastic surgery. He had an appointment earlier this month and according to the patient Dr. Myriam Jacobson does not want to proceed with any attempted flap closure. Although I do not have current access to her note in care everywhere this  is likely due to exposed bone. Again according to the patient they did a bone biopsy. He is still using a variant of Dakin solution changing twice a day. He has home health. The patient is not able to give me a firm answer about how long he spends on this in his wheelchair The patient also states that Dr. Myriam Jacobson wanted to reconsider a wound VAC. I really don't see this as a viable option at least not in the outpatient setting. The wound itself is frankly to close to his anal opening to maintain a seal. The last time we tried to do this home health was unable to manage it. It might be possible to maintain a wound VAC in this setting outside of the home such as a skilled nursing facility or an [place] however I am doubtful about this even in that setting **** READMISSION 09/21/17-He is here for evaluation of stage IV sacral ulcer. Since his last  evaluation here in December he has completed treatment for sacral osteomyelitis. He was at [place] for IV therapy and NPWT dressing changes. He was discharged, with home health services, in February. He admits that while in the skilled facility he had "80%" success with maintaining dressing, since discharge he has had approximately "40%" success with maintaining wound VAC dressing. We discussed at length that this is not a safe or recommended option. We will apply Dakin's wet to dry dressing daily and he will follow-up next week. He is accompanied today by his sister who is willing to assist in dressing changes; they will discuss the social issue as he feels he is capable of changing dressing daily when home health is not able. 09/28/17-He is here in follow-up evaluation for stage IV sacral pressure ulcer. He has been using the Dakin's wet-to-dry daily; he continues with home health. He is not accompanied by anyone at this visit. He will follow up in two weeks per his request/preference. 10/12/17 on evaluation today patient appears to be doing very well. The Dakin solution went to dry packings do seem to be helping him as far as the sacral wound is concerned I'm not seeing anything that has me more concerned as far as infection or otherwise is concerned. Overall I'm pleased with the appearance of the wound. 10/26/17-He is here in follow up evaluation for a stage IV sacral ulcer. He continues with daily Dakin's wet-to-dry. He is voicing no complaints or concerns. He will follow-up in 2 weeks 11/16/17-He is here for follow up evaluation for a palliative stage IV sacral pressure ulcer. We will continue with Dakin's wet-to-dry. He will follow- up in 4 weeks. He is expressing concern/complaint regarding new bed that has arrived, stating he is unable to manipulate/maneuver it due to the bed crank being at the foot of the bed. 12/14/17-He is here for evaluation for palliative stage IV sacral ulcer. He  is voicing no complaints or concerns. We will continue with one-to-one ratio of saline and dakins. He will follow-up in 3 weeks 01/04/18-He is here in follow-up evaluation for palliative stage IV sacral ulcer. He is inquiring about reinstating the negative pressure wound therapy. We discussed at length that the negative pressure wound therapy in the home setting has not been successful for him repeatedly with loss of cereal and unavailability of 24/7 help; reminded him that home health is not available 24/7 when loss of seal occurs. He does verbalize understanding to this and does not pursue. We also discussed the palliative nature of this ulcer (  given no significant change/improvement in measurement/appearance, not a candidate for muscle flap per plastic surgery, and continued independent living) and that the goal is for maintenance, decrease in infection and minimizing/avoiding deterioration given that he is independent in his care, does not have home health and requires daily dressing changes secondary to drainage amount. He is inquiring about a wound clinic in [facility], I have informed him that I am unfamiliar with that clinic but that he is encouraged to seek another opinion if that is his desire. We will continue with dakins and he will follow up in three weeks 01/25/18-He is here in follow-up evaluation for palliative stage IV sacral ulcer. He continues with Dakin's/saline 1:1 mixture wet to dry dressing changes. He states he has an appointment at [name] on 9/17 for evaluation of surgical intervention/closure of the sacral ulcer. He will follow-up here in 4 weeks Readmission: 07/16/2019 upon evaluation today patient appears to be doing really about the same as when he was previously seen here in the wound care center. He most recently was a patient of [facility] back when she was still working here in the center but had been referred to [facility] for consideration of a flap. With that being said the surgeon there at  [facility] stated that this was too large for her flap and they have been attempting to get this smaller in order to be able to proceed with a flap. Nonetheless unfortunately he has had a cycle of going back and forth between the osteomyelitis flaring and then sent him back and then making a little bit of progress only to be sent back again. It sounds like most recently they have been using a Iodoflex type dressing at this point which does not seem to have done any harm by any means. With that being said this wound seems to be quite large for using Iodoflex throughout and subsequently I think he may do much better with the use of Vioxx moistened gauze which would be safe for the new tissue growing and also keep the wound quite nice and clean. The patient is not opposed to this he in fact states that his home health nurse had mentioned this as a possibility as well. 07/23/2019 upon evaluation today patient actually appears to be doing very well in regard to his sacral wound. In fact this is much healthier the measurements not terribly different but again with a wound like this at home necessarily expect a a lot of change as far as the overall measurements are concerned in just 1 week's time. This is going be a much longer term process at this point. With that being said I do think that he is very healthy appearing as far as the base of the wound is concerned. 08/06/2019 upon evaluation today patient actually appears to be doing about the same with regard to his wound to be honest. There is really not a significant improvement overall based on what I am seeing today. Fortunately there is no signs of significant systemic infection. No fevers, chills, nausea, vomiting, or diarrhea. With that being said there is odor to the drainage from the wound and subsequently also what appears to be increased drainage based on what we are seeing today as well as what his home health nurse called [place] about that she was concerned  with as well. 08/13/2019 upon evaluation today patient appears to be doing about the same at this point with regard to his wound although I think the dressing may be a  little less drainage wise compared to what it was previous. Fortunately there is no signs of active infection at this time. No fevers, chills, nausea, vomiting, or diarrhea. He has been taking the antibiotic for only 3 days. 08/23/2019 upon evaluation today patient appears to be doing not nearly as well with regard to his wound. In general really not making a lot of progress which is unfortunate. There does not appear to be any signs of active infection at this time. No fevers, chills, nausea, vomiting, or diarrhea. With that being said the patient unfortunately does seem to be experiencing continued epiboly around the edges of the wound as well as significant scar tissue he has been in the majority of his day sitting in his chair which is likely a big reason for all of this. Fortunately there is no signs of active infection at this time. No fevers, chills, nausea, vomiting, or diarrhea. 08/29/2019 upon evaluation today patient's wound actually appears to be showing some signs of improvement. The region of few areas of new skin growth around the edges of the wound even where he has some of the epiboly. This is actually good news and I think that he has been very aggressive and offloading over the past week since we showed him the pictures of his wound last week. There still may be a chance that he is TZION, WEDEL. ([id number]) going require some sharp debridement to clear away some of these edges but to be honest I think that is can be quite an undertaking. The main reason is that he has a lot of thickened scar tissue and it is good to bleed quite significantly in my opinion. The I think if organ to do that we may even need to have a portable or disposable cautery in order to make sure that we are able to get the area completely sealed up  as far as bleeding is concerned. I do have one that we can utilize. However being that the wound looks so much better right now would like to give this 2 weeks to see how things stand and look at that point before making any additional recommendations. 09/12/2019 upon evaluation today patient appears to be making some progress as far as offloading is concerned. He still has a substantial wound but nonetheless he tells me he is off loading much more aggressively at this point. This is obviously good news. No fevers, chills, nausea, vomiting, or diarrhea. 4/22; I have not seen this patient in quite some time. No major change in the condition of the wound or its wound volume. Surrounding maceration of the skin around the edges of the wound. The wound is fairly substantial. To close to the anal opening to consider a wound VAC. He had extensive trials of plastic surgery at [facility] which really does not result in any improvement. His wound bed is however clean 10/10/2019 upon evaluation today patient appears to be doing well with regard to the wound in the sacral region. He has been tolerating the dressing changes without complication. Fortunately there is no signs of infection and he actually has some epithelial growth noted as well. He tells me has been trying to continue to offload this area which is excellent that is what he needs to do most as far as what he can have in his control. Otherwise I feel like the collagen with a saline moistened gauze behind has been beneficial for him. 5/20; collagen and normal saline wet-to-dry. Although the tissue  when this large sacral wound looks reasonable there is been absolutely no benefit to the overall closure. He has macerated skin around this. He tells me that he spends a large part of the day up in the wheelchair. He still has home health 11/07/2019 upon evaluation today patient appears to be doing well with regard to his wounds at this point. Fortunately  there is no signs of active infection at this time. Overall I feel like his sacral wound in general appears to be doing quite nicely. I am very pleased with overall how things are progressing and I feel like the patient is making excellent progress towards resolution. With that being said he still has a long ways to go before we expect to see this completely healed. 11/21/2019 upon evaluation today patient appears to be doing about the same in regard to his sacral wound. Unfortunately he is really not making much in the way of improvement when I questioned him about how much time he is really spending sitting on his bottom he tells me that the majority of his day is mainly in that position though he does spend some time in bed. It does not sound like it is enough however neither does it look like it is enough. 12/24/2019 upon evaluation today patient appears to be doing really about the same there is no significant improvement overall in his wound bed. Last time he was seen here we did have a discussion about the possibility of seeing if I can get a surgeon to evaluate and potentially clear away some of the thickened edges of the wound in order to allow this wound to potentially have a chance of closing with a wound VAC. Right now we could not even get a wound VAC to seal with some of the rolled and thickened edges. With that being said elected to the patient to think about this to discuss today. At this point the patient does want to see if I could make the referral to potentially see if the surgeon would be able to perform this and subsequently he agrees to go to a skilled nursing facility following if they are able to do this in order to allow for a wound VAC and to get this wound to heal more effectively and quickly. In my opinion the only way that I would recommend that he see the surgeon to clear away the edges would be if he is also in agreement with going to skilled nursing facility as I know he  cannot appropriately offload at home to take care of this and that setting. 01/07/2020 on evaluation today patient appears to be doing about the same in regard to his wound. There is no signs of active infection at this time which is great news. All in all he is really doing about the same. We have made referral to surgery to see if they can potentially perform debridement to clear away some of the scarred edging of the wound so we could potentially see about a wound VAC to try to help this heal more efficiently. Again we have discussed that if this occurs he would be in a skilled nursing facility following or release would need to be in order to appropriately offload. Nonetheless we are still in the process of working that referral. 01/21/2020 upon evaluation today patient appears to be doing about the same in regard to his sacral wound. He did see Dr. Lysle Pearl Dr. Ines Bloomer feeling was that there was not anything surgically he  could offer at this point as he did not feel like that a wound VAC would likely be appropriate for the patient. With that being said he did recommend potential for trying to get the patient into a skilled nursing facility as an outpatient and they will get me working on that as well. Fortunately there is no signs of active infection at this time. 11/10; this is a patient that has not been seen here in quite a bit of time while we were in our alternative venue. As well I have not seen this patient in probably over 2 years. As I remember things with him he has had this pressure ulcer encompassing his lower sacrum and coccyx and the surrounding buttocks for a prolonged period of time. When he first came here the surface looked fairly good and the wound was smaller we attempted a wound VAC but we could never keep this in place. Because of nonresponse of the wound to topical dressings we referred him to [facility] plastic surgery where preparation was being made for flap closure.  Intact he had a colostomy however after that he was deemed to have 2 large wound for flap closure. He did have osteomyelitis in the coccyx area in 2019. He is using wet-to-dry he has home health. They are managing his ostomy supplies as well. The wound is substantially larger than what I remember. Versus last time he was here to wound months or so ago he has an unstageable wound on the left upper thigh posteriorly. He tells me he is in his chair quite a bit especially recently 12/1; patient comes back to clinic. He did not get the lab work done apparently home health would not do this they referred him to the lab where he is apparently having a CBC checked because he is on iron infusions. We will try to get the lab work I ordered done there. He also did not get the x-ray he says he does not remember the discussion. The issue is that this large wound has probing areas to bone. He may have underlying osteomyelitis again he was treated for this previously I think in 2019 12/15; the patient did have lab work done. Notable for iron deficiency anemia with a hemoglobin of 9.7 although he is following with hematology for this his CRP was 7.1 which is up from his previous value. sedimentation rate of 91 which is also higher. However he maintains a relatively high sedimentation rate. Most disturbingly was an albumin of 2.8 although this is also somewhat higher than the 2.2 we had previously. With some difficulty I think a home health nurse actually made him some Dakin's solution he is using a Dakin's wet-to-dry to the large sacral wound. He has the area on the left upper thigh as well 06/17/2020; I follow this patient on a monthly basis. 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 At one point he had underlying osteomyelitis here and he received antibiotic therapy. I believe this was done through [facility] We have been using palliative Dakin's solution wet-to-dry on this which is really This  very large wound about the same with healthy surface. No active infection that is obvious. He has developed an area on the left posterior thigh and now a mirror image area on the right posterior thigh Conyer, Kayl A. ([id number]) noted pressure injuries from the wheelchair. This occurs even though he has a Roho cushion which is apparently new I have been forwarded  lab work from 05/15/2020. At that point his hemoglobin was 9.2 down from 13.8 on 10/5 this is microcytic hypochromic. He has known iron deficiency although the exact cause of this is never really been clear to me. He follows with hematology oncology and has been receiving IV iron he is supposed to follow-up in mid January and I told the patient this. From wound care point of view his albumin is 2.9 sedimentation rate is 74. His sedimentation rate has always been elevated in this range however his C-reactive protein was 82 which is way above anything previously measured. I see that on 05/08/2020 this was 7.1 I am not sure why the rapid discrepancy. He is supposed to have an x-ray of his lower sacrum looking for evidence of osteomyelitis that he was previously treated for. Is possible he would also require more advanced imaging such as another C CT or MRI He comes in also with paperwork for a [place]ron air-fluidized bed with a trapeze bar. I asked him if he would be able to transfer in and out of this bed . I am not opposed to ordering it, but I do not want to make his transfers more difficult than they already are. He has a [employer] fast streamer 07/15/2020; he is managed to get his Cl[place]ron air-fluidized bed. He has a new electric wheelchair with a wheelchair cushion. The x-ray I did showed some sacral bone destruction although this did not seem progressive him last time. He was treated for osteomyelitis in this area previously in 2018. His sedimentation rate is 91 CRP 7.1 albumin 2.8. We are using Dakin's wet-to-dry. He has [facility] home  health Objective Constitutional Sitting or standing Blood Pressure is within target range for patient.. Pulse regular and within target range for patient.Marland Kitchen Respirations regular, non- labored and within target range.. Temperature is normal and within the target range for the patient.Marland Kitchen appears in no distress. Vitals Time Taken: 11:43 AM, Temperature: 98.23 F, Pulse: 74 bpm, Respiratory Rate: 18 breaths/min, Blood Pressure: 123/74 mmHg. General Notes: Wound exam; the patient's large wound over the lower sacrum and surrounding buttocks looks clean this does not probe to bone. I think it looks about the same as when I saw this a month ago. There is still surrounding thick macerated tissue probably because of drainage. There is no surrounding soft tissue infection. He also has a wound on his left posterior thigh about the level of the gluteal fold. This would no doubt be a wheelchair edge injury. There is no evidence of surrounding infection here. Integumentary (Hair, Skin) Wound #10 status is Open. Original cause of wound was Pressure Injury. The wound is located on the Midline Sacrum. The wound measures 12cm length x 7cm width x 5cm depth; 65.973cm^2 area and 329.867cm^3 volume. There is bone, muscle, and Fat Layer (Subcutaneous Tissue) exposed. There is no tunneling or undermining noted. There is a large amount of serous drainage noted. The wound margin is epibole. There is large (67-100%) red, pink granulation within the wound bed. There is no necrotic tissue within the wound bed. Wound #12 status is Open. Original cause of wound was Gradually Appeared. The wound is located on the Left,Proximal Upper Leg. The wound measures 3cm length x 1.7cm width x 0.3cm depth; 4.006cm^2 area and 1.202cm^3 volume. There is Fat Layer (Subcutaneous Tissue) exposed. There is no tunneling or undermining noted. There is a medium amount of serosanguineous drainage noted. The wound margin is flat and intact. There is  large (67-100%) red granulation within  the wound bed. There is a small (1-33%) amount of necrotic tissue within the wound bed including Adherent Slough. Wound #13 status is Open. Original cause of wound was Gradually Appeared. The wound is located on the Right,Posterior Upper Leg. The wound measures 0cm length x 0cm width x 0cm depth; 0cm^2 area and 0cm^3 volume. There is no tunneling or undermining noted. There is a none present amount of drainage noted. There is no granulation within the wound bed. There is no necrotic tissue within the wound bed. 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 Due to amount of drainage, need to order additional supplies for patient to change on days home health does not come out. [place] for wound care. May utilize formulary equivalent dressing for wound treatment orders unless otherwise specified. Home Health Nurse may visit PRN to address patient s wound care needs. Scheduled days for dressing changes to be completed; exception, patient has scheduled wound care visit that day. **Please direct any NON-WOUND related issues/requests for orders to patient's Primary Care Physician. **If current dressing causes regression in wound condition, may D/C ordered dressing product/s and apply Normal Saline Moist Dressing daily until next [place] or Other MD appointment. **Notify [facility]aling [facility] of regression in wound condition at [phone]. Bathing/ Shower/ Hygiene: May shower; gently cleanse wound with antibacterial soap,  rinse and pat dry prior to dressing wounds Off-Loading: Hospital bed/mattress Air fluidized (Group 3) Turn and reposition every 2 hours Additional Orders / Instructions: Follow Nutritious Diet and Increase Protein Intake WOUND #10: - Sacrum Wound Laterality: Midline Cleanser: Vashe 5.8 (oz) (Home Health) 1 x Per Day/30 Days Discharge Instructions: Use vashe 5.8 (oz) as directed Primary Dressing: Gauze ([place]) 2 x Per Day/30 Days Discharge Instructions: moistened with Vashe and packed lightly into wound bed Secondary Dressing: Xtrasorb Large 6x9 (in/in) ([place]) 1 x Per Day/30 Days Discharge Instructions: needed due to excessive drainage. Secondary Dressing: ABD 8 x 10 (Home Health) 2 x Per Day/30 Days Discharge Instructions: needed due to excessive drainage Secured With: 570M Medipore H Soft Cloth Surgical Tape, 2x2 (in/yd) ([place]) 1 x Per Day/30 Days WOUND #12: - Upper Leg Wound Laterality: Left, Proximal Cleanser: Vashe 5.8 (oz) (Home Health) 1 x Per Day/30 Days Discharge Instructions: Use vashe 5.8 (oz) as directed Primary Dressing: Gauze ([place]) 2 x Per Day/30 Days Discharge Instructions: moistened with Vashe and packed lightly into wound bed Secondary Dressing: ABD Pad 5x9 (in/in) ([place]) 1 x Per Day/30 Days Secondary Dressing: Xtrasorb Medium 4x5 (in/in) (Home Health) 1 x Per Day/30 Days Discharge Instructions: Apply to wound as directed. Do not cut. Secured With: 570M Medipore H Soft Cloth Surgical Tape, 2x2 (in/yd) (Home Health) 1 x Per Day/30 Days WOUND #13: - Upper Leg Wound Laterality: Right, Posterior Cleanser: Vashe 5.8 (oz) (Home Health) 1 x Per Day/30 Days Discharge Instructions: Use vashe 5.8 (oz) as directed Primary Dressing: Gauze (Home Health) 2 x Per Day/30 Days Discharge Instructions: moistened with Vashe and packed lightly into wound bed Secondary Dressing: ABD Pad 5x9 (in/in) (Home Health) 1 x Per Day/30 Days Discharge Instructions:  Cover with ABD pad Secured With:  40M Medipore H Soft Cloth Surgical Tape, 2x2 (in/yd) (Home Health) 1 x Per Day/30 Days 1. After reviewing the wound the lab data and the x-ray I think things are largely chronic here. I do not know that I can justify sending him for consideration of IV antibiotics there is not enough evidence of progressive bone damage but I think he will need to be carefully monitored for this. 2. He will need some form of wet-to-dry dressing. He may be using Vashe solution wet-to-dry and that is really keeping this wound stable that is fine. 3. The area on the left upper thigh is no doubt a wheelchair injury. On that subject he admits to be sitting on this area for long periods of time. There would be no way to heal this like this 4. He tells me he is eating about 100 g of protein a day trying to get his protein up which is reasonable. 5. We will see him in 2 months on a palliative basis I have urged him to call us if things appear to be changing and he is concerned Electronic Signature(s) Signed: 07/18/2020 5:30:54 AM By: Linton Ham MD Entered By: Linton Ham on 07/15/2020 12:35:51 [name] ([id number]) -------------------------------------------------------------------------------- SuperBill Details Patient Name: Bradley Loa A. Date of Service: 07/15/2020 Medical Record Number: [id number] Patient Account Number: [id number] Date of Birth/Sex: Mar 08, 1946 (75 y.o. 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 L97.111 Non-pressure chronic ulcer of right thigh  limited to breakdown of skin Facility Procedures CPT4 Code: 91791505 Description: 99214 - WOUND CARE VISIT-LEV 4 EST PT Modifier: Quantity: 1 Physician Procedures CPT4 Code: [id number] Description: 16553 - WC PHYS LEVEL 3 - EST PT Modifier: Quantity: 1 CPT4 Code: Description: ICD-10 Diagnosis Description L89.154 Pressure ulcer of sacral region, stage 4 M86.68 Other chronic osteomyelitis, other site Modifier: Quantity: Electronic Signature(s) Signed: 07/15/2020 12:59:04 PM By: Gretta Cool, BSN, RN, CWS, Kim RN, BSN Signed: 07/18/2020 5:30:54 AM By: Linton Ham MD Entered By: Gretta Cool, BSN, RN, CWS, Kim on 07/15/2020 12:59:04

## 2020-07-21 DIAGNOSIS — S24104S Unspecified injury at T11-T12 level of thoracic spinal cord, sequela: Secondary | ICD-10-CM | POA: Diagnosis not present

## 2020-07-21 DIAGNOSIS — G8220 Paraplegia, unspecified: Secondary | ICD-10-CM | POA: Diagnosis not present

## 2020-07-21 DIAGNOSIS — L89893 Pressure ulcer of other site, stage 3: Secondary | ICD-10-CM | POA: Diagnosis not present

## 2020-07-21 DIAGNOSIS — N39498 Other specified urinary incontinence: Secondary | ICD-10-CM | POA: Diagnosis not present

## 2020-07-21 DIAGNOSIS — I10 Essential (primary) hypertension: Secondary | ICD-10-CM | POA: Diagnosis not present

## 2020-07-21 DIAGNOSIS — D509 Iron deficiency anemia, unspecified: Secondary | ICD-10-CM | POA: Diagnosis not present

## 2020-07-21 DIAGNOSIS — N401 Enlarged prostate with lower urinary tract symptoms: Secondary | ICD-10-CM | POA: Diagnosis not present

## 2020-07-21 DIAGNOSIS — L89154 Pressure ulcer of sacral region, stage 4: Secondary | ICD-10-CM | POA: Diagnosis not present

## 2020-07-21 DIAGNOSIS — N319 Neuromuscular dysfunction of bladder, unspecified: Secondary | ICD-10-CM | POA: Diagnosis not present

## 2020-07-24 DIAGNOSIS — N401 Enlarged prostate with lower urinary tract symptoms: Secondary | ICD-10-CM | POA: Diagnosis not present

## 2020-07-24 DIAGNOSIS — I10 Essential (primary) hypertension: Secondary | ICD-10-CM | POA: Diagnosis not present

## 2020-07-24 DIAGNOSIS — D509 Iron deficiency anemia, unspecified: Secondary | ICD-10-CM | POA: Diagnosis not present

## 2020-07-24 DIAGNOSIS — N319 Neuromuscular dysfunction of bladder, unspecified: Secondary | ICD-10-CM | POA: Diagnosis not present

## 2020-07-24 DIAGNOSIS — L89154 Pressure ulcer of sacral region, stage 4: Secondary | ICD-10-CM | POA: Diagnosis not present

## 2020-07-24 DIAGNOSIS — G8220 Paraplegia, unspecified: Secondary | ICD-10-CM | POA: Diagnosis not present

## 2020-07-24 DIAGNOSIS — L89893 Pressure ulcer of other site, stage 3: Secondary | ICD-10-CM | POA: Diagnosis not present

## 2020-07-24 DIAGNOSIS — S24104S Unspecified injury at T11-T12 level of thoracic spinal cord, sequela: Secondary | ICD-10-CM | POA: Diagnosis not present

## 2020-07-24 DIAGNOSIS — N39498 Other specified urinary incontinence: Secondary | ICD-10-CM | POA: Diagnosis not present

## 2020-07-27 DIAGNOSIS — N39498 Other specified urinary incontinence: Secondary | ICD-10-CM | POA: Diagnosis not present

## 2020-07-27 DIAGNOSIS — G8220 Paraplegia, unspecified: Secondary | ICD-10-CM | POA: Diagnosis not present

## 2020-07-27 DIAGNOSIS — I10 Essential (primary) hypertension: Secondary | ICD-10-CM | POA: Diagnosis not present

## 2020-07-27 DIAGNOSIS — N401 Enlarged prostate with lower urinary tract symptoms: Secondary | ICD-10-CM | POA: Diagnosis not present

## 2020-07-27 DIAGNOSIS — D509 Iron deficiency anemia, unspecified: Secondary | ICD-10-CM | POA: Diagnosis not present

## 2020-07-27 DIAGNOSIS — L89154 Pressure ulcer of sacral region, stage 4: Secondary | ICD-10-CM | POA: Diagnosis not present

## 2020-07-27 DIAGNOSIS — N319 Neuromuscular dysfunction of bladder, unspecified: Secondary | ICD-10-CM | POA: Diagnosis not present

## 2020-07-27 DIAGNOSIS — S24104S Unspecified injury at T11-T12 level of thoracic spinal cord, sequela: Secondary | ICD-10-CM | POA: Diagnosis not present

## 2020-07-27 DIAGNOSIS — L89893 Pressure ulcer of other site, stage 3: Secondary | ICD-10-CM | POA: Diagnosis not present

## 2020-07-30 DIAGNOSIS — L89154 Pressure ulcer of sacral region, stage 4: Secondary | ICD-10-CM | POA: Diagnosis not present

## 2020-07-30 DIAGNOSIS — N39498 Other specified urinary incontinence: Secondary | ICD-10-CM | POA: Diagnosis not present

## 2020-07-30 DIAGNOSIS — S24104S Unspecified injury at T11-T12 level of thoracic spinal cord, sequela: Secondary | ICD-10-CM | POA: Diagnosis not present

## 2020-07-30 DIAGNOSIS — L89893 Pressure ulcer of other site, stage 3: Secondary | ICD-10-CM | POA: Diagnosis not present

## 2020-07-30 DIAGNOSIS — D509 Iron deficiency anemia, unspecified: Secondary | ICD-10-CM | POA: Diagnosis not present

## 2020-07-30 DIAGNOSIS — N319 Neuromuscular dysfunction of bladder, unspecified: Secondary | ICD-10-CM | POA: Diagnosis not present

## 2020-07-30 DIAGNOSIS — N401 Enlarged prostate with lower urinary tract symptoms: Secondary | ICD-10-CM | POA: Diagnosis not present

## 2020-07-30 DIAGNOSIS — G8220 Paraplegia, unspecified: Secondary | ICD-10-CM | POA: Diagnosis not present

## 2020-07-30 DIAGNOSIS — I10 Essential (primary) hypertension: Secondary | ICD-10-CM | POA: Diagnosis not present

## 2020-08-01 DIAGNOSIS — M869 Osteomyelitis, unspecified: Secondary | ICD-10-CM | POA: Diagnosis not present

## 2020-08-01 DIAGNOSIS — G8220 Paraplegia, unspecified: Secondary | ICD-10-CM | POA: Diagnosis not present

## 2020-08-01 DIAGNOSIS — L89154 Pressure ulcer of sacral region, stage 4: Secondary | ICD-10-CM | POA: Diagnosis not present

## 2020-08-02 DIAGNOSIS — L89154 Pressure ulcer of sacral region, stage 4: Secondary | ICD-10-CM | POA: Diagnosis not present

## 2020-08-02 DIAGNOSIS — G8220 Paraplegia, unspecified: Secondary | ICD-10-CM | POA: Diagnosis not present

## 2020-08-03 DIAGNOSIS — N39498 Other specified urinary incontinence: Secondary | ICD-10-CM | POA: Diagnosis not present

## 2020-08-03 DIAGNOSIS — G8220 Paraplegia, unspecified: Secondary | ICD-10-CM | POA: Diagnosis not present

## 2020-08-03 DIAGNOSIS — S24104S Unspecified injury at T11-T12 level of thoracic spinal cord, sequela: Secondary | ICD-10-CM | POA: Diagnosis not present

## 2020-08-03 DIAGNOSIS — D509 Iron deficiency anemia, unspecified: Secondary | ICD-10-CM | POA: Diagnosis not present

## 2020-08-03 DIAGNOSIS — L89893 Pressure ulcer of other site, stage 3: Secondary | ICD-10-CM | POA: Diagnosis not present

## 2020-08-03 DIAGNOSIS — I10 Essential (primary) hypertension: Secondary | ICD-10-CM | POA: Diagnosis not present

## 2020-08-03 DIAGNOSIS — L89154 Pressure ulcer of sacral region, stage 4: Secondary | ICD-10-CM | POA: Diagnosis not present

## 2020-08-03 DIAGNOSIS — N401 Enlarged prostate with lower urinary tract symptoms: Secondary | ICD-10-CM | POA: Diagnosis not present

## 2020-08-03 DIAGNOSIS — N319 Neuromuscular dysfunction of bladder, unspecified: Secondary | ICD-10-CM | POA: Diagnosis not present

## 2020-08-06 DIAGNOSIS — I10 Essential (primary) hypertension: Secondary | ICD-10-CM | POA: Diagnosis not present

## 2020-08-06 DIAGNOSIS — N319 Neuromuscular dysfunction of bladder, unspecified: Secondary | ICD-10-CM | POA: Diagnosis not present

## 2020-08-06 DIAGNOSIS — L89154 Pressure ulcer of sacral region, stage 4: Secondary | ICD-10-CM | POA: Diagnosis not present

## 2020-08-06 DIAGNOSIS — N39498 Other specified urinary incontinence: Secondary | ICD-10-CM | POA: Diagnosis not present

## 2020-08-06 DIAGNOSIS — D509 Iron deficiency anemia, unspecified: Secondary | ICD-10-CM | POA: Diagnosis not present

## 2020-08-06 DIAGNOSIS — S24104S Unspecified injury at T11-T12 level of thoracic spinal cord, sequela: Secondary | ICD-10-CM | POA: Diagnosis not present

## 2020-08-06 DIAGNOSIS — G8220 Paraplegia, unspecified: Secondary | ICD-10-CM | POA: Diagnosis not present

## 2020-08-06 DIAGNOSIS — L89893 Pressure ulcer of other site, stage 3: Secondary | ICD-10-CM | POA: Diagnosis not present

## 2020-08-06 DIAGNOSIS — N401 Enlarged prostate with lower urinary tract symptoms: Secondary | ICD-10-CM | POA: Diagnosis not present

## 2020-08-10 DIAGNOSIS — L89893 Pressure ulcer of other site, stage 3: Secondary | ICD-10-CM | POA: Diagnosis not present

## 2020-08-10 DIAGNOSIS — L89154 Pressure ulcer of sacral region, stage 4: Secondary | ICD-10-CM | POA: Diagnosis not present

## 2020-08-10 DIAGNOSIS — N401 Enlarged prostate with lower urinary tract symptoms: Secondary | ICD-10-CM | POA: Diagnosis not present

## 2020-08-10 DIAGNOSIS — M869 Osteomyelitis, unspecified: Secondary | ICD-10-CM | POA: Diagnosis not present

## 2020-08-10 DIAGNOSIS — I10 Essential (primary) hypertension: Secondary | ICD-10-CM | POA: Diagnosis not present

## 2020-08-10 DIAGNOSIS — D509 Iron deficiency anemia, unspecified: Secondary | ICD-10-CM | POA: Diagnosis not present

## 2020-08-10 DIAGNOSIS — N319 Neuromuscular dysfunction of bladder, unspecified: Secondary | ICD-10-CM | POA: Diagnosis not present

## 2020-08-10 DIAGNOSIS — G8220 Paraplegia, unspecified: Secondary | ICD-10-CM | POA: Diagnosis not present

## 2020-08-10 DIAGNOSIS — N39498 Other specified urinary incontinence: Secondary | ICD-10-CM | POA: Diagnosis not present

## 2020-08-10 DIAGNOSIS — S24104S Unspecified injury at T11-T12 level of thoracic spinal cord, sequela: Secondary | ICD-10-CM | POA: Diagnosis not present

## 2020-08-13 DIAGNOSIS — L89154 Pressure ulcer of sacral region, stage 4: Secondary | ICD-10-CM | POA: Diagnosis not present

## 2020-08-13 DIAGNOSIS — S24104S Unspecified injury at T11-T12 level of thoracic spinal cord, sequela: Secondary | ICD-10-CM | POA: Diagnosis not present

## 2020-08-13 DIAGNOSIS — G8220 Paraplegia, unspecified: Secondary | ICD-10-CM | POA: Diagnosis not present

## 2020-08-13 DIAGNOSIS — N319 Neuromuscular dysfunction of bladder, unspecified: Secondary | ICD-10-CM | POA: Diagnosis not present

## 2020-08-13 DIAGNOSIS — N401 Enlarged prostate with lower urinary tract symptoms: Secondary | ICD-10-CM | POA: Diagnosis not present

## 2020-08-13 DIAGNOSIS — N39498 Other specified urinary incontinence: Secondary | ICD-10-CM | POA: Diagnosis not present

## 2020-08-13 DIAGNOSIS — L89893 Pressure ulcer of other site, stage 3: Secondary | ICD-10-CM | POA: Diagnosis not present

## 2020-08-13 DIAGNOSIS — I10 Essential (primary) hypertension: Secondary | ICD-10-CM | POA: Diagnosis not present

## 2020-08-13 DIAGNOSIS — D509 Iron deficiency anemia, unspecified: Secondary | ICD-10-CM | POA: Diagnosis not present

## 2020-08-16 ENCOUNTER — Other Ambulatory Visit: Payer: Self-pay | Admitting: Urology

## 2020-08-18 DIAGNOSIS — D509 Iron deficiency anemia, unspecified: Secondary | ICD-10-CM | POA: Diagnosis not present

## 2020-08-18 DIAGNOSIS — G8220 Paraplegia, unspecified: Secondary | ICD-10-CM | POA: Diagnosis not present

## 2020-08-18 DIAGNOSIS — L89893 Pressure ulcer of other site, stage 3: Secondary | ICD-10-CM | POA: Diagnosis not present

## 2020-08-18 DIAGNOSIS — N39498 Other specified urinary incontinence: Secondary | ICD-10-CM | POA: Diagnosis not present

## 2020-08-18 DIAGNOSIS — L89154 Pressure ulcer of sacral region, stage 4: Secondary | ICD-10-CM | POA: Diagnosis not present

## 2020-08-18 DIAGNOSIS — S24104S Unspecified injury at T11-T12 level of thoracic spinal cord, sequela: Secondary | ICD-10-CM | POA: Diagnosis not present

## 2020-08-18 DIAGNOSIS — N319 Neuromuscular dysfunction of bladder, unspecified: Secondary | ICD-10-CM | POA: Diagnosis not present

## 2020-08-18 DIAGNOSIS — I10 Essential (primary) hypertension: Secondary | ICD-10-CM | POA: Diagnosis not present

## 2020-08-18 DIAGNOSIS — N401 Enlarged prostate with lower urinary tract symptoms: Secondary | ICD-10-CM | POA: Diagnosis not present

## 2020-08-21 DIAGNOSIS — S24104S Unspecified injury at T11-T12 level of thoracic spinal cord, sequela: Secondary | ICD-10-CM | POA: Diagnosis not present

## 2020-08-21 DIAGNOSIS — L89893 Pressure ulcer of other site, stage 3: Secondary | ICD-10-CM | POA: Diagnosis not present

## 2020-08-21 DIAGNOSIS — L89154 Pressure ulcer of sacral region, stage 4: Secondary | ICD-10-CM | POA: Diagnosis not present

## 2020-08-21 DIAGNOSIS — I10 Essential (primary) hypertension: Secondary | ICD-10-CM | POA: Diagnosis not present

## 2020-08-21 DIAGNOSIS — N39498 Other specified urinary incontinence: Secondary | ICD-10-CM | POA: Diagnosis not present

## 2020-08-21 DIAGNOSIS — N319 Neuromuscular dysfunction of bladder, unspecified: Secondary | ICD-10-CM | POA: Diagnosis not present

## 2020-08-21 DIAGNOSIS — G8220 Paraplegia, unspecified: Secondary | ICD-10-CM | POA: Diagnosis not present

## 2020-08-21 DIAGNOSIS — N401 Enlarged prostate with lower urinary tract symptoms: Secondary | ICD-10-CM | POA: Diagnosis not present

## 2020-08-21 DIAGNOSIS — D509 Iron deficiency anemia, unspecified: Secondary | ICD-10-CM | POA: Diagnosis not present

## 2020-08-25 DIAGNOSIS — N401 Enlarged prostate with lower urinary tract symptoms: Secondary | ICD-10-CM | POA: Diagnosis not present

## 2020-08-25 DIAGNOSIS — G8220 Paraplegia, unspecified: Secondary | ICD-10-CM | POA: Diagnosis not present

## 2020-08-25 DIAGNOSIS — L89893 Pressure ulcer of other site, stage 3: Secondary | ICD-10-CM | POA: Diagnosis not present

## 2020-08-25 DIAGNOSIS — S24104S Unspecified injury at T11-T12 level of thoracic spinal cord, sequela: Secondary | ICD-10-CM | POA: Diagnosis not present

## 2020-08-25 DIAGNOSIS — L89154 Pressure ulcer of sacral region, stage 4: Secondary | ICD-10-CM | POA: Diagnosis not present

## 2020-08-25 DIAGNOSIS — D509 Iron deficiency anemia, unspecified: Secondary | ICD-10-CM | POA: Diagnosis not present

## 2020-08-25 DIAGNOSIS — N39498 Other specified urinary incontinence: Secondary | ICD-10-CM | POA: Diagnosis not present

## 2020-08-25 DIAGNOSIS — N319 Neuromuscular dysfunction of bladder, unspecified: Secondary | ICD-10-CM | POA: Diagnosis not present

## 2020-08-25 DIAGNOSIS — I10 Essential (primary) hypertension: Secondary | ICD-10-CM | POA: Diagnosis not present

## 2020-08-28 DIAGNOSIS — N319 Neuromuscular dysfunction of bladder, unspecified: Secondary | ICD-10-CM | POA: Diagnosis not present

## 2020-08-28 DIAGNOSIS — D509 Iron deficiency anemia, unspecified: Secondary | ICD-10-CM | POA: Diagnosis not present

## 2020-08-28 DIAGNOSIS — L89154 Pressure ulcer of sacral region, stage 4: Secondary | ICD-10-CM | POA: Diagnosis not present

## 2020-08-28 DIAGNOSIS — S24104S Unspecified injury at T11-T12 level of thoracic spinal cord, sequela: Secondary | ICD-10-CM | POA: Diagnosis not present

## 2020-08-28 DIAGNOSIS — G8220 Paraplegia, unspecified: Secondary | ICD-10-CM | POA: Diagnosis not present

## 2020-08-28 DIAGNOSIS — L89893 Pressure ulcer of other site, stage 3: Secondary | ICD-10-CM | POA: Diagnosis not present

## 2020-08-28 DIAGNOSIS — N401 Enlarged prostate with lower urinary tract symptoms: Secondary | ICD-10-CM | POA: Diagnosis not present

## 2020-08-28 DIAGNOSIS — I10 Essential (primary) hypertension: Secondary | ICD-10-CM | POA: Diagnosis not present

## 2020-08-28 DIAGNOSIS — N39498 Other specified urinary incontinence: Secondary | ICD-10-CM | POA: Diagnosis not present

## 2020-08-29 DIAGNOSIS — L89154 Pressure ulcer of sacral region, stage 4: Secondary | ICD-10-CM | POA: Diagnosis not present

## 2020-08-29 DIAGNOSIS — M869 Osteomyelitis, unspecified: Secondary | ICD-10-CM | POA: Diagnosis not present

## 2020-08-29 DIAGNOSIS — G8220 Paraplegia, unspecified: Secondary | ICD-10-CM | POA: Diagnosis not present

## 2020-08-30 DIAGNOSIS — L89154 Pressure ulcer of sacral region, stage 4: Secondary | ICD-10-CM | POA: Diagnosis not present

## 2020-08-30 DIAGNOSIS — G8220 Paraplegia, unspecified: Secondary | ICD-10-CM | POA: Diagnosis not present

## 2020-08-31 DIAGNOSIS — S24104S Unspecified injury at T11-T12 level of thoracic spinal cord, sequela: Secondary | ICD-10-CM | POA: Diagnosis not present

## 2020-08-31 DIAGNOSIS — G8220 Paraplegia, unspecified: Secondary | ICD-10-CM | POA: Diagnosis not present

## 2020-08-31 DIAGNOSIS — L89154 Pressure ulcer of sacral region, stage 4: Secondary | ICD-10-CM | POA: Diagnosis not present

## 2020-08-31 DIAGNOSIS — L89893 Pressure ulcer of other site, stage 3: Secondary | ICD-10-CM | POA: Diagnosis not present

## 2020-08-31 DIAGNOSIS — N39498 Other specified urinary incontinence: Secondary | ICD-10-CM | POA: Diagnosis not present

## 2020-08-31 DIAGNOSIS — D509 Iron deficiency anemia, unspecified: Secondary | ICD-10-CM | POA: Diagnosis not present

## 2020-08-31 DIAGNOSIS — N319 Neuromuscular dysfunction of bladder, unspecified: Secondary | ICD-10-CM | POA: Diagnosis not present

## 2020-08-31 DIAGNOSIS — I10 Essential (primary) hypertension: Secondary | ICD-10-CM | POA: Diagnosis not present

## 2020-08-31 DIAGNOSIS — N401 Enlarged prostate with lower urinary tract symptoms: Secondary | ICD-10-CM | POA: Diagnosis not present

## 2020-09-03 DIAGNOSIS — L89154 Pressure ulcer of sacral region, stage 4: Secondary | ICD-10-CM | POA: Diagnosis not present

## 2020-09-03 DIAGNOSIS — N319 Neuromuscular dysfunction of bladder, unspecified: Secondary | ICD-10-CM | POA: Diagnosis not present

## 2020-09-03 DIAGNOSIS — N39498 Other specified urinary incontinence: Secondary | ICD-10-CM | POA: Diagnosis not present

## 2020-09-03 DIAGNOSIS — N401 Enlarged prostate with lower urinary tract symptoms: Secondary | ICD-10-CM | POA: Diagnosis not present

## 2020-09-03 DIAGNOSIS — S24104S Unspecified injury at T11-T12 level of thoracic spinal cord, sequela: Secondary | ICD-10-CM | POA: Diagnosis not present

## 2020-09-03 DIAGNOSIS — I10 Essential (primary) hypertension: Secondary | ICD-10-CM | POA: Diagnosis not present

## 2020-09-03 DIAGNOSIS — G8220 Paraplegia, unspecified: Secondary | ICD-10-CM | POA: Diagnosis not present

## 2020-09-03 DIAGNOSIS — D509 Iron deficiency anemia, unspecified: Secondary | ICD-10-CM | POA: Diagnosis not present

## 2020-09-03 DIAGNOSIS — L89893 Pressure ulcer of other site, stage 3: Secondary | ICD-10-CM | POA: Diagnosis not present

## 2020-09-07 ENCOUNTER — Ambulatory Visit: Payer: Self-pay | Admitting: Urology

## 2020-09-09 ENCOUNTER — Encounter: Payer: Medicare HMO | Attending: Internal Medicine | Admitting: Internal Medicine

## 2020-09-09 ENCOUNTER — Other Ambulatory Visit: Payer: Self-pay

## 2020-09-09 DIAGNOSIS — D509 Iron deficiency anemia, unspecified: Secondary | ICD-10-CM | POA: Insufficient documentation

## 2020-09-09 DIAGNOSIS — I10 Essential (primary) hypertension: Secondary | ICD-10-CM | POA: Insufficient documentation

## 2020-09-09 DIAGNOSIS — L89154 Pressure ulcer of sacral region, stage 4: Secondary | ICD-10-CM | POA: Insufficient documentation

## 2020-09-09 DIAGNOSIS — M069 Rheumatoid arthritis, unspecified: Secondary | ICD-10-CM | POA: Insufficient documentation

## 2020-09-09 DIAGNOSIS — Z933 Colostomy status: Secondary | ICD-10-CM | POA: Diagnosis not present

## 2020-09-09 DIAGNOSIS — Z881 Allergy status to other antibiotic agents status: Secondary | ICD-10-CM | POA: Diagnosis not present

## 2020-09-09 DIAGNOSIS — Z880 Allergy status to penicillin: Secondary | ICD-10-CM | POA: Insufficient documentation

## 2020-09-09 DIAGNOSIS — L89153 Pressure ulcer of sacral region, stage 3: Secondary | ICD-10-CM | POA: Insufficient documentation

## 2020-09-09 DIAGNOSIS — G8220 Paraplegia, unspecified: Secondary | ICD-10-CM | POA: Diagnosis not present

## 2020-09-09 DIAGNOSIS — L97122 Non-pressure chronic ulcer of left thigh with fat layer exposed: Secondary | ICD-10-CM | POA: Diagnosis not present

## 2020-09-09 DIAGNOSIS — L97111 Non-pressure chronic ulcer of right thigh limited to breakdown of skin: Secondary | ICD-10-CM | POA: Diagnosis not present

## 2020-09-10 DIAGNOSIS — I10 Essential (primary) hypertension: Secondary | ICD-10-CM | POA: Diagnosis not present

## 2020-09-10 DIAGNOSIS — D509 Iron deficiency anemia, unspecified: Secondary | ICD-10-CM | POA: Diagnosis not present

## 2020-09-10 DIAGNOSIS — L89154 Pressure ulcer of sacral region, stage 4: Secondary | ICD-10-CM | POA: Diagnosis not present

## 2020-09-10 DIAGNOSIS — S24104S Unspecified injury at T11-T12 level of thoracic spinal cord, sequela: Secondary | ICD-10-CM | POA: Diagnosis not present

## 2020-09-10 DIAGNOSIS — N319 Neuromuscular dysfunction of bladder, unspecified: Secondary | ICD-10-CM | POA: Diagnosis not present

## 2020-09-10 DIAGNOSIS — L89893 Pressure ulcer of other site, stage 3: Secondary | ICD-10-CM | POA: Diagnosis not present

## 2020-09-10 DIAGNOSIS — M869 Osteomyelitis, unspecified: Secondary | ICD-10-CM | POA: Diagnosis not present

## 2020-09-10 DIAGNOSIS — G8220 Paraplegia, unspecified: Secondary | ICD-10-CM | POA: Diagnosis not present

## 2020-09-10 NOTE — Progress Notes (Signed)
Bradley [name], Bradley [name] ([id number]) Visit Report for 09/09/2020 Arrival Information Details Patient Name: Bradley [name], Bradley [name]. Date of Service: 09/09/2020 10:45 AM Medical Record Number: [id number] Patient Account Number: [id number] Date of Birth/Sex: 13-Feb-1946 (75 y.o. M) Treating RN: Cornell Barman Primary Care Arshiya Jakes: [name] Other Clinician: Referring Amro Winebarger: [name] Treating Hendrix Yurkovich/Extender: Tito Dine in Treatment: 54 Visit Information History Since Last Visit Added or deleted any medications: No Patient Arrived: Wheel Chair Had a fall or experienced change in No Arrival Time: 10:34 activities of daily living that may affect Accompanied By: self risk of falls: Transfer Assistance: None Hospitalized since last visit: No Patient Identification Verified: Yes Pain Present Now: Yes Patient Requires Transmission-Based Precautions: No Patient Has Alerts: No Electronic Signature(s) Signed: 09/09/2020 4:53:25 PM By: Jeanine Luz Entered By: Jeanine Luz on 09/09/2020 10:45:43 Bradley [name] ([id number]) -------------------------------------------------------------------------------- Clinic Level of Care Assessment Details Patient Name: Bradley [name]. Date of Service: 09/09/2020 10:45 AM Medical Record Number: [id number] Patient Account Number: [id number] Date of Birth/Sex: [birth year] (75 y.o. M) Treating RN: Cornell Barman Primary Care Tobiah Celestine: [name] Other Clinician: Referring Dequavion Follette: [name] Treating Glenda Spelman/Extender: Tito Dine in Treatment: 60 Clinic Level of Care Assessment Items TOOL 4 Quantity Score []  - Use when only an EandM is performed on FOLLOW-UP visit 0 ASSESSMENTS - Nursing Assessment / Reassessment X - Reassessment of Co-morbidities (includes updates in patient status) 1 10 X- 1 5 Reassessment of Adherence to Treatment Plan ASSESSMENTS - Wound and Skin Assessment / Reassessment []  - Simple Wound  Assessment / Reassessment - one wound 0 X- 2 5 Complex Wound Assessment / Reassessment - multiple wounds []  - 0 Dermatologic / Skin Assessment (not related to wound area) ASSESSMENTS - Focused Assessment []  - Circumferential Edema Measurements - multi extremities 0 []  - 0 Nutritional Assessment / Counseling / Intervention []  - 0 Lower Extremity Assessment (monofilament, tuning fork, pulses) []  - 0 Peripheral Arterial Disease Assessment (using hand held doppler) ASSESSMENTS - Ostomy and/or Continence Assessment and Care []  - Incontinence Assessment and Management 0 []  - 0 Ostomy Care Assessment and Management (repouching, etc.) PROCESS - Coordination of Care X - Simple Patient / Family Education for ongoing care 1 15 []  - 0 Complex (extensive) Patient / Family Education for ongoing care X- 1 10 Staff obtains Consents, Records, Test Results / Process Orders []  - 0 Staff telephones HHA, Nursing Homes / Clarify orders / etc []  - 0 Routine Transfer to another Facility (non-emergent condition) []  - 0 Routine Hospital Admission (non-emergent condition) []  - 0 New Admissions / Biomedical engineer / Ordering NPWT, Apligraf, etc. []  - 0 Emergency Hospital Admission (emergent condition) X- 1 10 Simple Discharge Coordination []  - 0 Complex (extensive) Discharge Coordination PROCESS - Special Needs []  - Pediatric / Minor Patient Management 0 []  - 0 Isolation Patient Management []  - 0 Hearing / Language / Visual special needs []  - 0 Assessment of Community assistance (transportation, D/C planning, etc.) []  - 0 Additional assistance / Altered mentation []  - 0 Support Surface(s) Assessment (bed, cushion, seat, etc.) INTERVENTIONS - Wound Cleansing / Measurement SAKIB, NOGUEZ A. ([id number]) []  - 0 Simple Wound Cleansing - one wound X- 2 5 Complex Wound Cleansing - multiple wounds X- 1 5 Wound Imaging (photographs - any number of wounds) []  - 0 Wound Tracing (instead of  photographs) []  - 0 Simple Wound Measurement - one wound X- 2 5 Complex Wound Measurement - multiple wounds INTERVENTIONS - Wound Dressings []  - Small  Wound Dressing one or multiple wounds 0 X- 1 15 Medium Wound Dressing one or multiple wounds X- 1 20 Large Wound Dressing one or multiple wounds []  - 0 Application of Medications - topical []  - 0 Application of Medications - injection INTERVENTIONS - Miscellaneous []  - External ear exam 0 []  - 0 Specimen Collection (cultures, biopsies, blood, body fluids, etc.) []  - 0 Specimen(s) / Culture(s) sent or taken to Lab for analysis X- 1 10 Patient Transfer (multiple staff / Civil Service fast streamer / Similar devices) []  - 0 Simple Staple / Suture removal (25 or less) []  - 0 Complex Staple / Suture removal (26 or more) []  - 0 Hypo / Hyperglycemic Management (close monitor of Blood Glucose) []  - 0 Ankle / Brachial Index (ABI) - do not check if billed separately X- 1 5 Vital Signs Has the patient been seen at the hospital within the last three years: Yes Total Score: 135 Level Of Care: New/Established - Level 4 Electronic Signature(s) Signed: 09/09/2020 5:25:07 PM By: Gretta Cool, BSN, RN, CWS, Kim RN, BSN Entered By: Gretta Cool, BSN, RN, CWS, Kim on 09/09/2020 11:15:56 Bradley [name] ([id number]) -------------------------------------------------------------------------------- Encounter Discharge Information Details Patient Name: Bradley Loa A. Date of Service: 09/09/2020 10:45 AM Medical Record Number: [id number] Patient Account Number: [id number] Date of Birth/Sex: 21-Jan-1946 (75 y.o. M) Treating RN: Donnamarie Poag Primary Care Majd Tissue: [name] Other Clinician: Referring Naomi Fitton: [name] Treating Montarius Kitagawa/Extender: Tito Dine in Treatment: 77 Encounter Discharge Information Items Discharge Condition: Stable Ambulatory Status: Wheelchair Discharge Destination: Home Transportation: Other Accompanied By:  self Schedule Follow-up Appointment: Yes Clinical Summary of Care: Electronic Signature(s) Signed: 09/09/2020 5:03:52 PM By: Donnamarie Poag Entered By: Donnamarie Poag on 09/09/2020 17:03:52 Bradley [name] ([id number]) -------------------------------------------------------------------------------- Lower Extremity Assessment Details Patient Name: Bradley Loa A. Date of Service: 09/09/2020 10:45 AM Medical Record Number: [id number] Patient Account Number: [id number] Date of Birth/Sex: [birth year] (75 y.o. M) Treating RN: Cornell Barman Primary Care Zelie Asbill: [name] Other Clinician: Referring Amyah Clawson: [name] Treating Lynden Carrithers/Extender: Tito Dine in Treatment: 60 Electronic Signature(s) Signed: 09/09/2020 4:53:25 PM By: Jeanine Luz Signed: 09/09/2020 5:25:07 PM By: Gretta Cool, BSN, RN, CWS, Kim RN, BSN Entered By: Jeanine Luz on 09/09/2020 11:00:24 Bradley [name] ([id number]) -------------------------------------------------------------------------------- Multi Wound Chart Details Patient Name: Bradley [name], Bradley A. Date of Service: 09/09/2020 10:45 AM Medical Record Number: [id number] Patient Account Number: [id number] Date of Birth/Sex: Aug 26, 1945 (75 y.o. M) Treating RN: Cornell Barman Primary Care Aristidis Talerico: [name] Other Clinician: Referring Icesis Renn: [name] Treating Alaycia Eardley/Extender: Tito Dine in Treatment: 60 Vital Signs Height(in): Pulse(bpm): 23 Weight(lbs): Blood Pressure(mmHg): 134/64 Body Mass Index(BMI): Temperature(F): 98.1 Respiratory Rate(breaths/min): 18 Photos: [10:No Photos] Wound Location: Midline Sacrum Left, Proximal Upper Leg Right, Posterior Upper Leg Wounding Event: Pressure Injury Gradually Appeared Gradually Appeared Primary Etiology: Pressure Ulcer Pressure Ulcer Pressure Ulcer Comorbid History: Anemia, Hypertension, History of Anemia, Hypertension, History of Anemia, Hypertension, History  of pressure wounds, Rheumatoid pressure wounds, Rheumatoid pressure wounds, Rheumatoid Arthritis, Paraplegia Arthritis, Paraplegia Arthritis, Paraplegia Date Acquired: 06/07/2015 03/06/2020 06/17/2020 Weeks of Treatment: 60 21 12 Wound Status: Open Open Open Measurements L x W x D (cm) 11.4x6.7x3.5 3.5x0.8x0.3 0x0x0 Area (cm) : 59.989 2.199 0 Volume (cm) : 209.96 0.66 0 % Reduction in Area: -16.40% 86.00% 100.00% % Reduction in Volume: 32.10% 58.00% 100.00% Starting Position 1 (o'clock): 4 Ending Position 1 (o'clock): 10 Maximum Distance 1 (cm): 0.5 Undermining: Yes No No Classification: Category/Stage IV Unstageable/Unclassified Category/Stage II Exudate Amount: Large Medium None Present  Exudate Type: Serosanguineous Serosanguineous N/A Exudate Color: red, brown red, brown N/A Wound Margin: Epibole Flat and Intact N/A Granulation Amount: Large (67-100%) Large (67-100%) None Present (0%) Granulation Quality: Red, Pink Red N/A Necrotic Amount: Small (1-33%) Small (1-33%) None Present (0%) Exposed Structures: Fat Layer (Subcutaneous Tissue): Fat Layer (Subcutaneous Tissue): Fascia: No Yes Yes Fat Layer (Subcutaneous Tissue): Bone: Yes Fascia: No No Fascia: No Tendon: No Tendon: No Tendon: No Muscle: No Muscle: No Muscle: No Joint: No Joint: No Joint: No Bone: No Bone: No Epithelialization: None None None Treatment Notes Electronic Signature(s) Signed: 09/10/2020 7:59:07 AM By: Linton Ham MD Bradley [name] ([id number]) Entered By: Linton Ham on 09/09/2020 11:27:04 Bradley [name] ([id number]) -------------------------------------------------------------------------------- Multi-Disciplinary Care Plan Details Patient Name: Bradley [name], Bradley A. Date of Service: 09/09/2020 10:45 AM Medical Record Number: [id number] Patient Account Number: [id number] Date of Birth/Sex: 1945/07/10 (75 y.o. M) Treating RN: Cornell Barman Primary Care Macy Lingenfelter: [name] Other  Clinician: Referring Jakyla Reza: [name] Treating Kerrianne Jeng/Extender: Tito Dine in Treatment: 63 Active Inactive Abuse / Safety / Falls / Self Care Management Nursing Diagnoses: Impaired physical mobility Goals: Patient will not develop complications from immobility Date Initiated: 07/16/2019 Target Resolution Date: 02/07/2020 Goal Status: Active Interventions: Assess Activities of Daily Living upon admission and as needed Notes: Pressure Nursing Diagnoses: Knowledge deficit related to causes and risk factors for pressure ulcer development Goals: Patient will remain free from development of additional pressure ulcers Date Initiated: 07/16/2019 Target Resolution Date: 02/07/2020 Goal Status: Active Interventions: Provide education on pressure ulcers Notes: Electronic Signature(s) Signed: 09/09/2020 5:25:07 PM By: Gretta Cool, BSN, RN, CWS, Kim RN, BSN Entered By: Gretta Cool, BSN, RN, CWS, Kim on 09/09/2020 11:08:37 Bradley [name] ([id number]) -------------------------------------------------------------------------------- Pain Assessment Details Patient Name: Bradley Loa A. Date of Service: 09/09/2020 10:45 AM Medical Record Number: [id number] Patient Account Number: [id number] Date of Birth/Sex: 08/06/1945 (75 y.o. M) Treating RN: Cornell Barman Primary Care Baudelio Karnes: [name] Other Clinician: Referring Gradie Butrick: [name] Treating Adriauna Campton/Extender: Tito Dine in Treatment: 61 Active Problems Location of Pain Severity and Description of Pain Patient Has Paino Yes Site Locations Rate the pain. Current Pain Level: 2 Pain Management and Medication Current Pain Management: Electronic Signature(s) Signed: 09/09/2020 4:53:25 PM By: Jeanine Luz Signed: 09/09/2020 5:25:07 PM By: Gretta Cool, BSN, RN, CWS, Kim RN, BSN Entered By: Jeanine Luz on 09/09/2020 10:46:29 Bradley [name]  ([id number]) -------------------------------------------------------------------------------- Patient/Caregiver Education Details Patient Name: JESSY, CYBULSKI A. Date of Service: 09/09/2020 10:45 AM Medical Record Number: [id number] Patient Account Number: [id number] Date of Birth/Gender: [birth year] (75 y.o. M) Treating RN: Cornell Barman Primary Care Physician: [name] Other Clinician: Referring Physician: [name] Treating Physician/Extender: Tito Dine in Treatment: 6 Education Assessment Education Provided To: Patient Education Topics Provided Pressure: Handouts: Preventing Pressure Ulcers Methods: Demonstration, Explain/Verbal Responses: State content correctly Electronic Signature(s) Signed: 09/09/2020 5:25:07 PM By: Gretta Cool, BSN, RN, CWS, Kim RN, BSN Entered By: Gretta Cool, BSN, RN, CWS, Kim on 09/09/2020 11:16:21 Bradley [name] ([id number]) -------------------------------------------------------------------------------- Wound Assessment Details Patient Name: Bradley [name], Bradley A. Date of Service: 09/09/2020 10:45 AM Medical Record Number: [id number] Patient Account Number: [id number] Date of Birth/Sex: December 20, 1945 (75 y.o. M) Treating RN: Cornell Barman Primary Care Sanjuana Mruk: [name] Other Clinician: Referring Emmah Bratcher: [name] Treating Sareen Randon/Extender: Tito Dine in Treatment: 60 Wound Status Wound Number: 10 Primary Pressure Ulcer Etiology: Wound Location: Midline Sacrum Wound Open Wounding Event: Pressure Injury Status: Date Acquired: 06/07/2015 Comorbid Anemia, Hypertension, History of pressure wounds, Weeks Of  Treatment: 60 History: Rheumatoid Arthritis, Paraplegia Clustered Wound: No Wound Measurements Length: (cm) 11.4 Width: (cm) 6.7 Depth: (cm) 3.5 Area: (cm) 59.989 Volume: (cm) 209.96 % Reduction in Area: -16.4% % Reduction in Volume: 32.1% Epithelialization: None Undermining: Yes Starting Position  (o'clock): 4 Ending Position (o'clock): 10 Maximum Distance: (cm) 0.5 Wound Description Classification: Category/Stage IV Wound Margin: Epibole Exudate Amount: Large Exudate Type: Serosanguineous Exudate Color: red, brown Foul Odor After Cleansing: No Slough/Fibrino Yes Wound Bed Granulation Amount: Large (67-100%) Exposed Structure Granulation Quality: Red, Pink Fascia Exposed: No Necrotic Amount: Small (1-33%) Fat Layer (Subcutaneous Tissue) Exposed: Yes Necrotic Quality: Adherent Slough Tendon Exposed: No Muscle Exposed: No Joint Exposed: No Bone Exposed: Yes Treatment Notes Wound #10 (Sacrum) Wound Laterality: Midline Cleanser Vashe 5.8 (oz) Discharge Instruction: Use vashe 5.8 (oz) as directed Peri-Wound Care Topical Primary Dressing Gauze Discharge Instruction: moistened with Vashe and packed lightly into wound bed Secondary Dressing Xtrasorb Large 6x9 (in/in) Discharge Instruction: needed due to excessive drainage. ABD COBEY, RAINERI ([id number]) Discharge Instruction: needed due to excessive drainage Secured With 444M Medipore H Soft Cloth Surgical Tape, 2x2 (in/yd) Compression Wrap Compression Stockings Add-Ons Electronic Signature(s) Signed: 09/09/2020 4:53:25 PM By: Jeanine Luz Signed: 09/09/2020 5:25:07 PM By: Gretta Cool, BSN, RN, CWS, Kim RN, BSN Entered By: Jeanine Luz on 09/09/2020 10:57:10 Bradley [name] ([id number]) -------------------------------------------------------------------------------- Wound Assessment Details Patient Name: Bradley [name], Bradley A. Date of Service: 09/09/2020 10:45 AM Medical Record Number: [id number] Patient Account Number: [id number] Date of Birth/Sex: February 24, 1946 (75 y.o. M) Treating RN: Cornell Barman Primary Care Aniaya Bacha: [name] Other Clinician: Referring Loveda Colaizzi: [name] Treating Zyonna Vardaman/Extender: Tito Dine in Treatment: 60 Wound Status Wound Number: 12 Primary Pressure  Ulcer Etiology: Wound Location: Left, Proximal Upper Leg Wound Open Wounding Event: Gradually Appeared Status: Date Acquired: 03/06/2020 Comorbid Anemia, Hypertension, History of pressure wounds, Weeks Of Treatment: 21 History: Rheumatoid Arthritis, Paraplegia Clustered Wound: No Photos Wound Measurements Length: (cm) 3.5 Width: (cm) 0.8 Depth: (cm) 0.3 Area: (cm) 2.199 Volume: (cm) 0.66 % Reduction in Area: 86% % Reduction in Volume: 58% Epithelialization: None Tunneling: No Undermining: No Wound Description Classification: Unstageable/Unclassified Wound Margin: Flat and Intact Exudate Amount: Medium Exudate Type: Serosanguineous Exudate Color: red, brown Foul Odor After Cleansing: No Slough/Fibrino Yes Wound Bed Granulation Amount: Large (67-100%) Exposed Structure Granulation Quality: Red Fascia Exposed: No Necrotic Amount: Small (1-33%) Fat Layer (Subcutaneous Tissue) Exposed: Yes Necrotic Quality: Adherent Slough Tendon Exposed: No Muscle Exposed: No Joint Exposed: No Bone Exposed: No Treatment Notes Wound #12 (Upper Leg) Wound Laterality: Left, Proximal Cleanser Vashe 5.8 (oz) Discharge Instruction: Use vashe 5.8 (oz) as directed Peri-Wound Care SERAFINO, BURCIAGA ([id number]) Topical Primary Dressing AquacelAg Advantage Dressing, 2X2 (in/in) Discharge Instruction: Apply to wound as directed Gauze Discharge Instruction: moistened with Vashe and packed lightly into wound bed Secondary Dressing ABD Pad 5x9 (in/in) Xtrasorb Medium 4x5 (in/in) Discharge Instruction: Apply to wound as directed. Do not cut. Secured With 444M Medipore H Soft Cloth Surgical Tape, 2x2 (in/yd) Compression Wrap Compression Stockings Add-Ons Electronic Signature(s) Signed: 09/09/2020 4:53:25 PM By: Jeanine Luz Signed: 09/09/2020 5:25:07 PM By: Gretta Cool, BSN, RN, CWS, Kim RN, BSN Entered By: Jeanine Luz on 09/09/2020 10:59:23 Bradley [name]  ([id number]) -------------------------------------------------------------------------------- Wound Assessment Details Patient Name: Bradley [name], Bradley A. Date of Service: 09/09/2020 10:45 AM Medical Record Number: [id number] Patient Account Number: [id number] Date of Birth/Sex: March 30, 1946 (75 y.o. M) Treating RN: Cornell Barman Primary Care Amour Trigg: [name] Other Clinician: Referring Dewane Timson: [name] Treating Beth Spackman/Extender: Linton Ham  G Weeks in Treatment: 60 Wound Status Wound Number: 13 Primary Pressure Ulcer Etiology: Wound Location: Right, Posterior Upper Leg Wound Open Wounding Event: Gradually Appeared Status: Date Acquired: 06/17/2020 Comorbid Anemia, Hypertension, History of pressure wounds, Weeks Of Treatment: 12 History: Rheumatoid Arthritis, Paraplegia Clustered Wound: No Photos Wound Measurements Length: (cm) Width: (cm) Depth: (cm) Area: (cm) Volume: (cm) 0 % Reduction in Area: 100% 0 % Reduction in Volume: 100% 0 Epithelialization: None 0 Tunneling: No 0 Undermining: No Wound Description Classification: Category/Stage II Exudate Amount: None Present Foul Odor After Cleansing: No Slough/Fibrino No Wound Bed Granulation Amount: None Present (0%) Exposed Structure Necrotic Amount: None Present (0%) Fascia Exposed: No Fat Layer (Subcutaneous Tissue) Exposed: No Tendon Exposed: No Muscle Exposed: No Joint Exposed: No Bone Exposed: No Treatment Notes Wound #13 (Upper Leg) Wound Laterality: Right, Posterior Cleanser Vashe 5.8 (oz) Discharge Instruction: Use vashe 5.8 (oz) as directed Peri-Wound Care Topical SRINIVAS, LIPPMAN ([id number]) Primary Dressing Gauze Discharge Instruction: moistened with Vashe and packed lightly into wound bed Secondary Dressing ABD Pad 5x9 (in/in) Discharge Instruction: Cover with ABD pad Secured With 66M Medipore H Soft Cloth Surgical Tape, 2x2 (in/yd) Compression Wrap Compression  Stockings Add-Ons Electronic Signature(s) Signed: 09/09/2020 4:53:25 PM By: Jeanine Luz Signed: 09/09/2020 5:25:07 PM By: Gretta Cool, BSN, RN, CWS, Kim RN, BSN Entered By: Jeanine Luz on 09/09/2020 10:58:52 Bradley [name] ([id number]) -------------------------------------------------------------------------------- Grandfather Details Patient Name: Bradley Loa A. Date of Service: 09/09/2020 10:45 AM Medical Record Number: [id number] Patient Account Number: [id number] Date of Birth/Sex: 1946-02-11 (75 y.o. M) Treating RN: Cornell Barman Primary Care Mahima Hottle: [name] Other Clinician: Referring Keaisha Sublette: [name] Treating Laterrian Hevener/Extender: Tito Dine in Treatment: 60 Vital Signs Time Taken: 10:40 Temperature (F): 98.1 Pulse (bpm): 66 Respiratory Rate (breaths/min): 18 Blood Pressure (mmHg): 134/64 Reference Range: 80 - 120 mg / dl Electronic Signature(s) Signed: 09/09/2020 4:53:25 PM By: Jeanine Luz Entered By: Jeanine Luz on 09/09/2020 10:46:12

## 2020-09-10 NOTE — Progress Notes (Signed)
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 have [facility] but miraculously this patient who is a paraplegic is actually changing the dressings himself. 04/12/16 patient presents today for a follow-up of his essentially large sacral pressure ulcer stage III. Nothing has changed dramatically since I last saw him about one month ago. He has seen Dr. Dellia Nims once the interim. With  that being said patient's wound appears somewhat less macerated today compared to previous evaluations. He still has no pain being a quadriplegic. 04-26-16 Mr. Spruce returns today for a violation of his stage III sacral pressure ulcer he denies any complaints concerns or issues over the past 2 weeks. He missed to changing dressing twice daily due to drainage although he states this is not an increase in drainage over the past 2 weeks. He does change his dressings independently. He admits to sitting in his motorized chair for no more than 2-3 hours at which time he transfers to bed and  rotates lateral position. 05/10/16; Eliya Geiman returns today for review of his stage III sacral pressure ulcer. He denies any concerns over the last 2 weeks although he seems to be running out of Aquacel Ag and on those days he uses Medihoney. He has [facility] was supplying his dressings. He still complains of drainage. He does his dressings independently. He has in his motorized chair for 2-3 hours that time other than that he offloads this. Dimensions of the wound are down 1 cm in both directions. He underwent an aggressive debridement on his last visit of thick circumferential skin and subcutaneous tissue. It is possible at some point in the future he is going to need this done again 05/24/16; the patient returns today for review of his stage III sacral pressure ulcer. We have been using Aquacel Ag he tells me that he changes this up to twice a day. I'm not really certain of the reason for this frequency of changing. He has some involvement from the home health nurses but I think is doing most of the changing himself which I think because of his paraplegia would be a very difficult exercise. Nevertheless he states that there is "wetness". I am not sure if there is another dressing that we could easily changed that much. I'd wanted to change to [facility] but I'll need to have a sense of how frequent he  would need to change this. 06/14/16; this is a patient returns for review of his stage III sacral pressure ulcer. We have been using Aquacel Ag and over the last 2 visits he has had extensive debridement so of the thick circumferential skin and subcutaneous tissue that surrounds this wound. In spite of this really absolutely no change in the condition of the wound warrants measurements. We have [facility] home health I believe changing the dressing on 3 occasions the patient states he does this on one occasion himself 06/28/16; this is a patient who has a fairly large stage III sacral pressure ulcer. I changed him to [facility] from Aquacel 2 weeks ago. He XAVION, MUSCAT ([id number]) returns today in follow-up. In the meantime a nurse from [facility] has calledrequesting ordering of a wound VAC. He had this discussion before. The problem is the proximity of the lowest edge of this wound to the patient's anal opening roughly 3/4 of an inch. Can't see how this can be arranged. Apparently the nurse who is calling has a lot of experience, the question would be then when she is not available would be doing this. I would not have thought that this wound is not amenable to a wound VAC because of this reason 07/12/16; the patient comes in today and I have signed orders for a wound VAC. The home health team through [facility] is convinced that he can benefit from this even though there is close proximity to his anal opening beneath the gluteal clefts. The patient does not have a bowel regimen but states he has a bowel movement every 2 days this will also provide some problem with regards to the vac seal 07/26/16; the patient never did obtain a [place] wound VAC as he could not afford the $200 per month co-pay we have been using Hydrofera Blue now for 6 weeks or so. No major change in this wound at all. He is still not interested in the concept of plastic surgery. There changing the dressing every second  day 08/09/16; the patient arrives with a wound precisely in the  same situation. In keeping with the plan I outlined last time extensive debridement with an open curet the surface of this is not completely viable. Still has some degree of surrounding thick skin and subcutaneous tissue. No evidence of infection. Once again I have had a conversation with him about plastic surgery, he is simply not interested. 08/23/16; wound is really no different. Thick circumferential skin and subcutaneous tissue around the wound edge which is a lot better from debridement we did earlier in the year. The surface of the wound looks viable however with a curet there is definitely a gritty surface to this. We use Medihoney for a while, he could not afford Santyl. I don't think we could get a supply of Iodoflex. He talks a little more positively about the concept of plastic surgery which I've gone over with him today 08/31/16;; patient arrives in clinic today with the wound surface really no different there is no changes in dimensions. I debrided today surface on the left upper side of this wound aggressively week ago there is no real change here no evidence of epithelialization. The problem with debridement in the clinic is that he believes from this very liberally. We have been using Sorbact. 09/21/16; absolutely no change in the appearance or measurements of this wound. More recently I've been debrided in this aggressively and using sorbact to see if we could get to a better wound surface. Although this visually looks satisfactory, debridement reveals a very gritty surface to this. However even with this debridement and removal of thick nonviable skin and subcutaneous tissue from around the large amount of the circumference of this wound we have made absolutely no progress. This may be an offloading issue I'm just not completely certain. It has 2 close proximity in its inferior aspect to consider negative pressure  therapy 10/26/16; READMISSION This patient called our clinic yesterday to report an odor in his wound. He had been to see plastic surgery at [facility] at our request after his last visit on 09/21/16; we have been seeing him for several months with a large stage III wound. He had been sent to general surgery for consideration of a colostomy, that appointment was not until mid June He comes in today with a temperature of 101. He is reporting an odor in the wound since last weekend. 01/10/17 Readmission: 01/10/17 On evaluation today it is noted that patient has been seen by plastic surgery at [facility] since he was last evaluated here. They did discuss with him the possibility of a flap according to the notes but unfortunately at this point he was not quite ready to proceed with surgery and instead wanted to give the Wound VAC a try. In the hospital they were able to get a good seal on the Wound VAC. Unfortunately since that time they have been having trouble in regard to his current home health company keeping a simple on the Wound VAC. He would like to switch to a different home health company. With that being said it sounds as if the problem is that his wound VAC is not feeling at the lower portion of his back and he tells me that he can take some of the clear plastic and put over that area when the sill breaks and it will correct it for time. He has no discomfort or pain which is good news. He has been treated with IV vancomycin since he was last seen here and has an appointment with a infectious disease specialist in two  days on 01/12/17. Otherwise he was transferred back to [place] for continuing to monitor and manage is wound as she progresses with a Wound VAC for the time being. 01/17/17 on evaluation today patient continues to show evidence of slight improvement with the Wound VAC fortunately there's no evidence of infection or otherwise worsening condition in general. Nonetheless we were unable to get him switch to  [facility] in regard to home help from his current company. I'm not sure the reasoning behind but for some reason he was not accepted as a patient with him. Continue to apply the Wound VAC which does still show that some maceration around the wound edges but the wound measurements were slightly improved. No fevers, chills, nausea, or vomiting noted at this time. 02/14/17; this patient I have not seen in 5 months although he has been readmitted to our clinic seen by our physician assistant Jeri Cos twice in early August. I have looked through [facility] notes care everywhere. The patient saw plastic surgery in May [Dr. Bhatt}. The patient was sent to general surgery and ultimately had a colostomy placed. On 11/29/16. This was after he was admitted to [facility] sometime in May. An MRI of the pelvis on 5/23 showed osteomyelitis of the coccyx. An attempt was made to drain fluid that was not successful. He was treated with empiric broad-spectrum antibiotics VAC/cefepime/Flagyl starting on 11/02/16 with plans for a 6 week course. According to their notes he was sent to a nursing home. Was last seen by Dr. Myriam Jacobson of plastic surgery on 12/28/16. The first part of the note is a long dissertation about the difficulties finding adequate patients for flap closure of pressure ulcers. At that time the wound was noted to be stage IV based I think on underlying infection no exposed bone and healthy granulation tissue. 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 He was last seen by infectious disease 01/12/17 A Dr. Uvaldo Rising. His note says that Mr. Grizzell was not interested in a flap closure for referring a trial of the wound VAC. As previously anticipated the wound VAC could not be maintained as an outpatient in the community. He is now using something similar to a Dakin's wet to dry recommended by [facility] VASHE solution. He is placing this twice a day himself. This is almost s hopeless  setting in terms of heeling 02/28/17; he is using a Dakin's wet to dry. Most of the wound surface looks satisfactory however the deeper area over his coccyx now has exposed bone I'm not sure if I noted this last week. 03/21/17; patient is usingVASHE solution wet to dry which I gather is a variation on Dakin solution. He has home health changing this 3 times a week the other days he does this himself. His appointment with plastic surgery 04/18/17; patient continues to use a variant of Dakin solution I believe. His wound continues to have a clean viable surface. The 2 areas of exposed bone in the center of this wound had closed over. He has an appointment with plastic surgery on December 5 at which time I hope that there'll be a plan for myocutaneous flap closure In looking through [facility] I couldn't find any more plastic surgery appointments. I did come across the fact that he is been followed by hematology for a microcytic hypochromic anemia. He had a reasonably normal looking hemoglobin electrophoresis. His iron level was 10 and according to the patient he is going for IV  iron infusions starting tomorrow. He had a sedimentation rate of 74. More problematically from a pure wound care point of view his albumin was 2.7 earlier this month 05/17/17; this is a patient I follow monthly. He has a large now stage IV wound over his bilateral buttocks with close proximity to his anal MACHI, WHITTAKER A. ([id number]) opening. More recently he has developed a large area with exposed bone in the center of this probably secondary to the underlying osteomyelitis E had in the summer. He also follows with Dr. Myriam Jacobson at [facility] who is plastic surgery. He had an appointment earlier this month and according to the patient Dr. Myriam Jacobson does not want to proceed with any attempted flap closure. Although I do not have current access to her note in care everywhere this is likely due to exposed bone. Again according to the  patient they did a bone biopsy. He is still using a variant of Dakin solution changing twice a day. He has home health. The patient is not able to give me a firm answer about how long he spends on this in his wheelchair The patient also states that Dr. Myriam Jacobson wanted to reconsider a wound VAC. I really don't see this as a viable option at least not in the outpatient setting. The wound itself is frankly to close to his anal opening to maintain a seal. The last time we tried to do this home health was unable to manage it. It might be possible to maintain a wound VAC in this setting outside of the home such as a skilled nursing facility or an [place] however I am doubtful about this even in that setting **** READMISSION 09/21/17-He is here for evaluation of stage IV sacral ulcer. Since his last evaluation here in December he has completed treatment for sacral osteomyelitis. He was at [facility] for IV therapy and NPWT dressing changes. He was discharged, with home health services, in February. He admits that while in the skilled facility he had "80%" success with maintaining dressing, since discharge he has had approximately "40%" success with maintaining wound VAC dressing. We discussed at length that this is not a safe or recommended option. We will apply Dakin's wet to dry dressing daily and he will follow-up next week. He is accompanied today by his sister who is willing to assist in dressing changes; they will discuss the social issue as he feels he is capable of changing dressing daily when home health is not able. 09/28/17-He is here in follow-up evaluation for stage IV sacral pressure ulcer. He has been using the Dakin's wet-to-dry daily; he continues with home health. He is not accompanied by anyone at this visit. He will follow up in two weeks per his request/preference. 10/12/17 on evaluation today patient appears to be doing very well. The Dakin solution went to dry packings do seem to be  helping him as far as the sacral wound is concerned I'm not seeing anything that has me more concerned as far as infection or otherwise is concerned. Overall I'm pleased with the appearance of the wound. 10/26/17-He is here in follow up evaluation for a stage IV sacral ulcer. He continues with daily Dakin's wet-to-dry. He is voicing no complaints or concerns. He will follow-up in 2 weeks 11/16/17-He is here for follow up evaluation for a palliative stage IV sacral pressure ulcer. We will continue with Dakin's wet-to-dry. He will follow- up in 4 weeks. He is expressing concern/complaint regarding new bed that has arrived, stating he  is unable to manipulate/maneuver it due to the bed crank being at the foot of the bed. 12/14/17-He is here for evaluation for palliative stage IV sacral ulcer. He is voicing no complaints or concerns. We will continue with one-to-one ratio of saline and dakins. He will follow-up in 3 weeks 01/04/18-He is here in follow-up evaluation for palliative stage IV sacral ulcer. He is inquiring about reinstating the negative pressure wound therapy. We discussed at length that the negative pressure wound therapy in the home setting has not been successful for him repeatedly with loss of cereal and unavailability of 24/7 help; reminded him that home health is not available 24/7 when loss of seal occurs. He does verbalize understanding to this and does not pursue. We also discussed the palliative nature of this ulcer (given no significant change/improvement in measurement/appearance, not a candidate for muscle flap per plastic surgery, and continued independent living) and that the goal is for maintenance, decrease in infection and minimizing/avoiding deterioration given that he is independent in his care, does not have home health and requires daily dressing changes secondary to drainage amount. He is inquiring about a wound clinic in [facility], I have informed him that I am unfamiliar with  that clinic but that he is encouraged to seek another opinion if that is his desire. We will continue with dakins and he will follow up in three weeks 01/25/18-He is here in follow-up evaluation for palliative stage IV sacral ulcer. He continues with Dakin's/saline 1:1 mixture wet to dry dressing changes. He states he has an appointment at [facility] on 9/17 for evaluation of surgical intervention/closure of the sacral ulcer. He will follow-up here in 4 weeks Readmission: 07/16/2019 upon evaluation today patient appears to be doing really about the same as when he was previously seen here in the wound care center. He most recently was a patient of [place] back when she was still working here in the center but had been referred to [facility] for consideration of a flap. With that being said the surgeon there at [facility] stated that this was too large for her flap and they have been attempting to get this smaller in order to be able to proceed with a flap. Nonetheless unfortunately he has had a cycle of going back and forth between the osteomyelitis flaring and then sent him back and then making a little bit of progress only to be sent back again. It sounds like most recently they have been using a Iodoflex type dressing at this point which does not seem to have done any harm by any means. With that being said this wound seems to be quite large for using Iodoflex throughout and subsequently I think he may do much better with the use of Vioxx moistened gauze which would be safe for the new tissue growing and also keep the wound quite nice and clean. The patient is not opposed to this he in fact states that his home health nurse had mentioned this as a possibility as well. 07/23/2019 upon evaluation today patient actually appears to be doing very well in regard to his sacral wound. In fact this is much healthier the measurements not terribly different but again with a wound like this at home necessarily expect a a lot of  change as far as the overall measurements are concerned in just 1 week's time. This is going be a much longer term process at this point. With that being said I do think that he is very healthy appearing as  far as the base of the wound is concerned. 08/06/2019 upon evaluation today patient actually appears to be doing about the same with regard to his wound to be honest. There is really not a significant improvement overall based on what I am seeing today. Fortunately there is no signs of significant systemic infection. No fevers, chills, nausea, vomiting, or diarrhea. With that being said there is odor to the drainage from the wound and subsequently also what appears to be increased drainage based on what we are seeing today as well as what his home health nurse called [place] about that she was concerned with as well. 08/13/2019 upon evaluation today patient appears to be doing about the same at this point with regard to his wound although I think the dressing may be a little less drainage wise compared to what it was previous. Fortunately there is no signs of active infection at this time. No fevers, chills, nausea, vomiting, or diarrhea. He has been taking the antibiotic for only 3 days. 08/23/2019 upon evaluation today patient appears to be doing not nearly as well with regard to his wound. In general really not making a lot of progress which is unfortunate. There does not appear to be any signs of active infection at this time. No fevers, chills, nausea, vomiting, or diarrhea. With that being said the patient unfortunately does seem to be experiencing continued epiboly around the edges of the wound as well as significant scar tissue he has been in the majority of his day sitting in his chair which is likely a big reason for all of this. Fortunately there is no signs of active infection at this time. No fevers, chills, nausea, vomiting, or diarrhea. 08/29/2019 upon evaluation today patient's wound actually  appears to be showing some signs of improvement. The region of few areas of new skin growth around the edges of the wound even where he has some of the epiboly. This is actually good news and I think that he has been very aggressive and offloading over the past week since we showed him the pictures of his wound last week. There still may be a chance that he is CARMINO, OCAIN. ([id number]) going require some sharp debridement to clear away some of these edges but to be honest I think that is can be quite an undertaking. The main reason is that he has a lot of thickened scar tissue and it is good to bleed quite significantly in my opinion. The I think if organ to do that we may even need to have a portable or disposable cautery in order to make sure that we are able to get the area completely sealed up as far as bleeding is concerned. I do have one that we can utilize. However being that the wound looks so much better right now would like to give this 2 weeks to see how things stand and look at that point before making any additional recommendations. 09/12/2019 upon evaluation today patient appears to be making some progress as far as offloading is concerned. He still has a substantial wound but nonetheless he tells me he is off loading much more aggressively at this point. This is obviously good news. No fevers, chills, nausea, vomiting, or diarrhea. 4/22; I have not seen this patient in quite some time. No major change in the condition of the wound or its wound volume. Surrounding maceration of the skin around the edges of the wound. The wound is fairly substantial. To close to the  anal opening to consider a wound VAC. He had extensive trials of plastic surgery at [facility] which really does not result in any improvement. His wound bed is however clean 10/10/2019 upon evaluation today patient appears to be doing well with regard to the wound in the sacral region. He has been tolerating the dressing  changes without complication. Fortunately there is no signs of infection and he actually has some epithelial growth noted as well. He tells me has been trying to continue to offload this area which is excellent that is what he needs to do most as far as what he can have in his control. Otherwise I feel like the collagen with a saline moistened gauze behind has been beneficial for him. 5/20; collagen and normal saline wet-to-dry. Although the tissue when this large sacral wound looks reasonable there is been absolutely no benefit to the overall closure. He has macerated skin around this. He tells me that he spends a large part of the day up in the wheelchair. He still has home health 11/07/2019 upon evaluation today patient appears to be doing well with regard to his wounds at this point. Fortunately there is no signs of active infection at this time. Overall I feel like his sacral wound in general appears to be doing quite nicely. I am very pleased with overall how things are progressing and I feel like the patient is making excellent progress towards resolution. With that being said he still has a long ways to go before we expect to see this completely healed. 11/21/2019 upon evaluation today patient appears to be doing about the same in regard to his sacral wound. Unfortunately he is really not making much in the way of improvement when I questioned him about how much time he is really spending sitting on his bottom he tells me that the majority of his day is mainly in that position though he does spend some time in bed. It does not sound like it is enough however neither does it look like it is enough. 12/24/2019 upon evaluation today patient appears to be doing really about the same there is no significant improvement overall in his wound bed. Last time he was seen here we did have a discussion about the possibility of seeing if I can get a surgeon to evaluate and potentially clear away some of the  thickened edges of the wound in order to allow this wound to potentially have a chance of closing with a wound VAC. Right now we could not even get a wound VAC to seal with some of the rolled and thickened edges. With that being said elected to the patient to think about this to discuss today. At this point the patient does want to see if I could make the referral to potentially see if the surgeon would be able to perform this and subsequently he agrees to go to a skilled nursing facility following if they are able to do this in order to allow for a wound VAC and to get this wound to heal more effectively and quickly. In my opinion the only way that I would recommend that he see the surgeon to clear away the edges would be if he is also in agreement with going to skilled nursing facility as I know he cannot appropriately offload at home to take care of this and that setting. 01/07/2020 on evaluation today patient appears to be doing about the same in regard to his wound. There is no  signs of active infection at this time which is great news. All in all he is really doing about the same. We have made referral to surgery to see if they can potentially perform debridement to clear away some of the scarred edging of the wound so we could potentially see about a wound VAC to try to help this heal more efficiently. Again we have discussed that if this occurs he would be in a skilled nursing facility following or release would need to be in order to appropriately offload. Nonetheless we are still in the process of working that referral. 01/21/2020 upon evaluation today patient appears to be doing about the same in regard to his sacral wound. He did see Dr. Lysle Pearl Dr. Ines Bloomer feeling was that there was not anything surgically he could offer at this point as he did not feel like that a wound VAC would likely be appropriate for the patient. With that being said he did recommend potential for trying to get the  patient into a skilled nursing facility as an outpatient and they will get me working on that as well. Fortunately there is no signs of active infection at this time. 11/10; this is a patient that has not been seen here in quite a bit of time while we were in our alternative venue. As well I have not seen this patient in probably over 2 years. As I remember things with him he has had this pressure ulcer encompassing his lower sacrum and coccyx and the surrounding buttocks for a prolonged period of time. When he first came here the surface looked fairly good and the wound was smaller we attempted a wound VAC but we could never keep this in place. Because of nonresponse of the wound to topical dressings we referred him to [facility] plastic surgery where preparation was being made for flap closure. Intact he had a colostomy however after that he was deemed to have 2 large wound for flap closure. He did have osteomyelitis in the coccyx area in 2019. He is using wet-to-dry he has home health. They are managing his ostomy supplies as well. The wound is substantially larger than what I remember. Versus last time he was here to wound months or so ago he has an unstageable wound on the left upper thigh posteriorly. He tells me he is in his chair quite a bit especially recently 12/1; patient comes back to clinic. He did not get the lab work done apparently home health would not do this they referred him to the lab where he is apparently having a CBC checked because he is on iron infusions. We will try to get the lab work I ordered done there. He also did not get the x-ray he says he does not remember the discussion. The issue is that this large wound has probing areas to bone. He may have underlying osteomyelitis again he was treated for this previously I think in 2019 12/15; the patient did have lab work done. Notable for iron deficiency anemia with a hemoglobin of 9.7 although he is following with  hematology for this his CRP was 7.1 which is up from his previous value. sedimentation rate of 91 which is also higher. However he maintains a relatively high sedimentation rate. Most disturbingly was an albumin of 2.8 although this is also somewhat higher than the 2.2 we had previously. With some difficulty I think a home health nurse actually made him some Dakin's solution he is using a Dakin's  wet-to-dry to the large sacral wound. He has the area on the left upper thigh as well 06/17/2020; I follow this patient on a monthly basis. 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 At one point he had underlying osteomyelitis here and he received antibiotic therapy. I believe this was done through [facility] We have been using palliative Dakin's solution wet-to-dry on this which is really This very large wound about the same with healthy surface. No active infection that is obvious. He has developed an area on the left posterior thigh and now a mirror image area on the right posterior thigh Strang, Georgie A. ([id number]) noted pressure injuries from the wheelchair. This occurs even though he has a Roho cushion which is apparently new I have been forwarded lab work from 05/15/2020. At that point his hemoglobin was 9.2 down from 13.8 on 10/5 this is microcytic hypochromic. He has known iron deficiency although the exact cause of this is never really been clear to me. He follows with hematology oncology and has been receiving IV iron he is supposed to follow-up in mid January and I told the patient this. From wound care point of view his albumin is 2.9 sedimentation rate is 74. His sedimentation rate has always been elevated in this range however his C-reactive protein was 82 which is way above anything previously measured. I see that on 05/08/2020 this was 7.1 I am not sure why the rapid discrepancy. He is supposed to have an x-ray of his lower sacrum looking for evidence of osteomyelitis that  he was previously treated for. Is possible he would also require more advanced imaging such as another C CT or MRI He comes in also with paperwork for a [place]ron air-fluidized bed with a trapeze bar. I asked him if he would be able to transfer in and out of this bed . I am not opposed to ordering it, but I do not want to make his transfers more difficult than they already are. He has a [employer] fast streamer 07/15/2020; he is managed to get his [place]ron air-fluidized bed. He has a new electric wheelchair with a wheelchair cushion. The x-ray I did showed some sacral bone destruction although this did not seem progressive him last time. He was treated for osteomyelitis in this area previously in 2018. His sedimentation rate is 91 CRP 7.1 albumin 2.8. We are using Dakin's wet-to-dry. He has [facility] home health 4/6; 7-month follow-up. He has a [place]ron air-fluidized bed. He has the wheelchair cushion. His major wound on the coccyx looks somewhat better including including both of [place] my nurse and my feeling that it is closed in somewhat. Wound is still too close to his anal opening to consider a wound VAC. At 1 point we were trying to get him into [facility] plastics to get a flap closure but for 1 reason or another this never moved forward I was never really certain what the issue was. He has an area on the left upper thigh in the gluteal fold. This is a clean wound. Objective Constitutional Sitting or standing Blood Pressure is within target range for patient.. Pulse regular and within target range for patient.Marland Kitchen Respirations regular, non- labored and within target range.. Temperature is normal and within the target range for the patient.Marland Kitchen appears in no distress. Vitals Time Taken: 10:40 AM, Temperature: 98.1 F, Pulse: 66 bpm, Respiratory Rate: 18 breaths/min, Blood Pressure: 134/64 mmHg. General Notes: Wound exam; the area over the lower sacrum and buttocks.  This has a reasonably clean surface. It goes very close to  the anal opening which was the reason we could never maintain a wound VAC. He has polypoid areas on the lower edges of this which I think are result of chronic maceration and wetness. Otherwise his wound does not probe to bone there is no surrounding infection His area on the left gluteal fold is actually very superficial Integumentary (Hair, Skin) Wound #10 status is Open. Original cause of wound was Pressure Injury. The date acquired was: 06/07/2015. The wound has been in treatment 60 weeks. The wound is located on the Midline Sacrum. The wound measures 11.4cm length x 6.7cm width x 3.5cm depth; 59.989cm^2 area and 209.96cm^3 volume. There is bone and Fat Layer (Subcutaneous Tissue) exposed. There is undermining starting at 4:00 and ending at 10:00 with a maximum distance of 0.5cm. There is a large amount of serosanguineous drainage noted. The wound margin is epibole. There is large (67- 100%) red, pink granulation within the wound bed. There is a small (1-33%) amount of necrotic tissue within the wound bed including Adherent Slough. Wound #12 status is Open. Original cause of wound was Gradually Appeared. The date acquired was: 03/06/2020. The wound has been in treatment 21 weeks. The wound is located on the Left,Proximal Upper Leg. The wound measures 3.5cm length x 0.8cm width x 0.3cm depth; 2.199cm^2 area and 0.66cm^3 volume. There is Fat Layer (Subcutaneous Tissue) exposed. There is no tunneling or undermining noted. There is a medium amount of serosanguineous drainage noted. The wound margin is flat and intact. There is large (67-100%) red granulation within the wound bed. There is a small (1-33%) amount of necrotic tissue within the wound bed including Adherent Slough. Wound #13 status is Open. Original cause of wound was Gradually Appeared. The date acquired was: 06/17/2020. The wound has been in treatment 12 weeks. The wound is located on the Right,Posterior Upper Leg. The wound measures 0cm  length x 0cm width x 0cm depth; 0cm^2 area and 0cm^3 volume. There is no tunneling or undermining noted. There is a none present amount of drainage noted. There is no granulation within the wound bed. There is no necrotic tissue within the wound bed. Assessment Active Problems ICD-10 Pressure ulcer of sacral region, stage 4 Other chronic osteomyelitis, other site Paraplegia, unspecified TIERNAN, SUTO A. 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 Due to amount of drainage, need to order additional supplies for patient to change on days home health does not come out. [place] for wound care. May utilize formulary equivalent dressing for wound treatment orders unless otherwise specified. Home Health Nurse may visit PRN to address patient s wound care needs. Scheduled days for dressing changes to be completed; exception, patient has scheduled wound care visit that day. **Please direct any NON-WOUND related issues/requests for orders to patient's Primary Care Physician. **If current dressing causes regression in wound condition, may D/C ordered dressing product/s and apply Normal Saline Moist Dressing daily until next [place] or Other MD appointment. **Notify [facility] of regression in wound condition at [phone]. Bathing/ Shower/ Hygiene: May shower; gently cleanse wound with antibacterial soap, rinse and pat dry prior to dressing wounds Off-Loading: Hospital bed/mattress Air fluidized (Group 3) Turn and reposition every 2 hours Additional  Orders / Instructions: Follow Nutritious Diet and Increase Protein Intake WOUND #10: - Sacrum Wound Laterality: Midline Cleanser: Vashe 5.8 (oz) (Home  Health) 1 x Per Day/30 Days Discharge Instructions: Use vashe 5.8 (oz) as directed Primary Dressing: Gauze ([place]) 2 x Per Day/30 Days Discharge Instructions: moistened with Vashe and packed lightly into wound bed Secondary Dressing: Xtrasorb Large 6x9 (in/in) ([place]) 1 x Per Day/30 Days Discharge Instructions: needed due to excessive drainage. Secondary Dressing: ABD (Home Health) 2 x Per Day/30 Days Discharge Instructions: needed due to excessive drainage Secured With: 12M Medipore H Soft Cloth Surgical Tape, 2x2 (in/yd) ([place]) 1 x Per Day/30 Days WOUND #12: - Upper Leg Wound Laterality: Left, Proximal Cleanser: Vashe 5.8 (oz) (Home Health) 1 x Per Day/30 Days Discharge Instructions: Use vashe 5.8 (oz) as directed Primary Dressing: AquacelAg Advantage Dressing, 2X2 (in/in) ([place]) 1 x Per Day/30 Days Discharge Instructions: Apply to wound as directed Primary Dressing: Gauze ([place]) 2 x Per Day/30 Days Discharge Instructions: moistened with Vashe and packed lightly into wound bed Secondary Dressing: ABD Pad 5x9 (in/in) ([place]) 1 x Per Day/30 Days Secondary Dressing: Xtrasorb Medium 4x5 (in/in) (Home Health) 1 x Per Day/30 Days Discharge Instructions: Apply to wound as directed. Do not cut. Secured With: 12M Medipore H Soft Cloth Surgical Tape, 2x2 (in/yd) (Home Health) 1 x Per Day/30 Days WOUND #13: - Upper Leg Wound Laterality: Right, Posterior Cleanser: Vashe 5.8 (oz) (Home Health) 1 x Per Day/30 Days Discharge Instructions: Use vashe 5.8 (oz) as directed Primary Dressing: Gauze (Home Health) 2 x Per Day/30 Days Discharge Instructions: moistened with Vashe and packed lightly into wound bed Secondary Dressing: ABD Pad 5x9 (in/in) (Home Health) 1 x Per Day/30 Days Discharge Instructions: Cover with ABD pad Secured With: 12M Medipore H Soft Cloth Surgical Tape, 2x2 (in/yd) ([place]) 1 x Per Day/30 Days 1. I think his large chronic sacral wound  actually looks somewhat better. Looks smaller depth is come in. 2. He asked me about operative debridement I am not sure what he was talking about I will need to clarify this next time initially thought it was to remove some of the polypoid macerated skin on the bottom of the wound edge but he may be talking about flap closure again. I will need to remind him that he would end up in a rehabilitation facility. 3. I have changed to silver alginate and bordered foam in the area of the left gluteal fold. I think this should close. I pointed out to him that this is a wheelchair edge her wheelchair cushion edge injury 4. He asked about metronidazole for odor control. I will try to order this Electronic Signature(s) Signed: 09/10/2020 7:59:07 AM By: Linton Ham MD Entered By: Linton Ham on 09/09/2020 11:34:18 [name] ([id number]) -------------------------------------------------------------------------------- SuperBill Details Patient Name: [name] Date of Service: 09/09/2020 Medical Record Number: [id number] Patient Account Number: [id number] Date of Birth/Sex: [birth year] (75 y.o. 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 Facility Procedures CPT4 Code: 30160109 Description: 99214 - WOUND CARE VISIT-LEV 4 EST PT Modifier: Quantity: 1 Physician Procedures CPT4  Code: 0475339 Description: 17921 - WC PHYS LEVEL 3 - EST PT Modifier: Quantity: 1 CPT4 Code: Description: ICD-10 Diagnosis  Description L89.154 Pressure ulcer of sacral region, stage 4 L97.128 Non-pressure chronic ulcer of left thigh with other specified severit L97.111 Non-pressure chronic ulcer of right thigh limited to breakdown of ski G82.20  Paraplegia, unspecified Modifier: y n Quantity: Electronic Signature(s) Signed: 09/10/2020 7:59:07 AM By: Linton Ham MD Entered By: Linton Ham on 09/09/2020 11:36:55

## 2020-09-17 ENCOUNTER — Other Ambulatory Visit: Payer: Self-pay

## 2020-09-17 ENCOUNTER — Ambulatory Visit (INDEPENDENT_AMBULATORY_CARE_PROVIDER_SITE_OTHER): Payer: Medicare HMO | Admitting: Urology

## 2020-09-17 VITALS — BP 121/67 | HR 74 | Ht 73.0 in | Wt 213.0 lb

## 2020-09-17 DIAGNOSIS — R972 Elevated prostate specific antigen [PSA]: Secondary | ICD-10-CM

## 2020-09-17 DIAGNOSIS — N319 Neuromuscular dysfunction of bladder, unspecified: Secondary | ICD-10-CM
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
## 2020-09-18 DIAGNOSIS — I10 Essential (primary) hypertension: Secondary | ICD-10-CM | POA: Diagnosis not present

## 2020-09-18 DIAGNOSIS — G8220 Paraplegia, unspecified: Secondary | ICD-10-CM | POA: Diagnosis not present

## 2020-09-18 DIAGNOSIS — J309 Allergic rhinitis, unspecified: Secondary | ICD-10-CM | POA: Diagnosis not present

## 2020-09-18 DIAGNOSIS — L89893 Pressure ulcer of other site, stage 3: Secondary | ICD-10-CM | POA: Diagnosis not present

## 2020-09-18 DIAGNOSIS — N400 Enlarged prostate without lower urinary tract symptoms: Secondary | ICD-10-CM | POA: Diagnosis not present

## 2020-09-18 DIAGNOSIS — L89154 Pressure ulcer of sacral region, stage 4: Secondary | ICD-10-CM | POA: Diagnosis not present

## 2020-09-18 DIAGNOSIS — S24104S Unspecified injury at T11-T12 level of thoracic spinal cord, sequela: Secondary | ICD-10-CM | POA: Diagnosis not present

## 2020-09-18 DIAGNOSIS — D509 Iron deficiency anemia, unspecified: Secondary | ICD-10-CM | POA: Diagnosis not present

## 2020-09-18 DIAGNOSIS — N319 Neuromuscular dysfunction of bladder, unspecified: Secondary | ICD-10-CM | POA: Diagnosis not present

## 2020-09-19 ENCOUNTER — Encounter: Payer: Self-pay | Admitting: Urology

## 2020-09-23 DIAGNOSIS — J309 Allergic rhinitis, unspecified: Secondary | ICD-10-CM | POA: Diagnosis not present

## 2020-09-23 DIAGNOSIS — I10 Essential (primary) hypertension: Secondary | ICD-10-CM | POA: Diagnosis not present

## 2020-09-23 DIAGNOSIS — N319 Neuromuscular dysfunction of bladder, unspecified: Secondary | ICD-10-CM | POA: Diagnosis not present

## 2020-09-23 DIAGNOSIS — L89893 Pressure ulcer of other site, stage 3: Secondary | ICD-10-CM | POA: Diagnosis not present

## 2020-09-23 DIAGNOSIS — S24104S Unspecified injury at T11-T12 level of thoracic spinal cord, sequela: Secondary | ICD-10-CM | POA: Diagnosis not present

## 2020-09-23 DIAGNOSIS — G8220 Paraplegia, unspecified: Secondary | ICD-10-CM | POA: Diagnosis not present

## 2020-09-23 DIAGNOSIS — L89154 Pressure ulcer of sacral region, stage 4: Secondary | ICD-10-CM | POA: Diagnosis not present

## 2020-09-23 DIAGNOSIS — D509 Iron deficiency anemia, unspecified: Secondary | ICD-10-CM | POA: Diagnosis not present

## 2020-09-23 DIAGNOSIS — N400 Enlarged prostate without lower urinary tract symptoms: Secondary | ICD-10-CM | POA: Diagnosis not present

## 2020-09-29 DIAGNOSIS — M869 Osteomyelitis, unspecified: Secondary | ICD-10-CM | POA: Diagnosis not present

## 2020-09-29 DIAGNOSIS — L89154 Pressure ulcer of sacral region, stage 4: Secondary | ICD-10-CM | POA: Diagnosis not present

## 2020-09-29 DIAGNOSIS — G8220 Paraplegia, unspecified: Secondary | ICD-10-CM | POA: Diagnosis not present

## 2020-09-30 DIAGNOSIS — N319 Neuromuscular dysfunction of bladder, unspecified: Secondary | ICD-10-CM | POA: Diagnosis not present

## 2020-09-30 DIAGNOSIS — G8220 Paraplegia, unspecified: Secondary | ICD-10-CM | POA: Diagnosis not present

## 2020-09-30 DIAGNOSIS — N400 Enlarged prostate without lower urinary tract symptoms: Secondary | ICD-10-CM | POA: Diagnosis not present

## 2020-09-30 DIAGNOSIS — D509 Iron deficiency anemia, unspecified: Secondary | ICD-10-CM | POA: Diagnosis not present

## 2020-09-30 DIAGNOSIS — I10 Essential (primary) hypertension: Secondary | ICD-10-CM | POA: Diagnosis not present

## 2020-09-30 DIAGNOSIS — L89154 Pressure ulcer of sacral region, stage 4: Secondary | ICD-10-CM | POA: Diagnosis not present

## 2020-09-30 DIAGNOSIS — S24104S Unspecified injury at T11-T12 level of thoracic spinal cord, sequela: Secondary | ICD-10-CM | POA: Diagnosis not present

## 2020-09-30 DIAGNOSIS — J309 Allergic rhinitis, unspecified: Secondary | ICD-10-CM | POA: Diagnosis not present

## 2020-09-30 DIAGNOSIS — L89893 Pressure ulcer of other site, stage 3: Secondary | ICD-10-CM | POA: Diagnosis not present

## 2020-10-07 DIAGNOSIS — I10 Essential (primary) hypertension: Secondary | ICD-10-CM | POA: Diagnosis not present

## 2020-10-07 DIAGNOSIS — J309 Allergic rhinitis, unspecified: Secondary | ICD-10-CM | POA: Diagnosis not present

## 2020-10-07 DIAGNOSIS — N400 Enlarged prostate without lower urinary tract symptoms: Secondary | ICD-10-CM | POA: Diagnosis not present

## 2020-10-07 DIAGNOSIS — L89893 Pressure ulcer of other site, stage 3: Secondary | ICD-10-CM | POA: Diagnosis not present

## 2020-10-07 DIAGNOSIS — L89154 Pressure ulcer of sacral region, stage 4: Secondary | ICD-10-CM | POA: Diagnosis not present

## 2020-10-07 DIAGNOSIS — D509 Iron deficiency anemia, unspecified: Secondary | ICD-10-CM | POA: Diagnosis not present

## 2020-10-07 DIAGNOSIS — S24104S Unspecified injury at T11-T12 level of thoracic spinal cord, sequela: Secondary | ICD-10-CM | POA: Diagnosis not present

## 2020-10-07 DIAGNOSIS — N319 Neuromuscular dysfunction of bladder, unspecified: Secondary | ICD-10-CM | POA: Diagnosis not present

## 2020-10-07 DIAGNOSIS — G8220 Paraplegia, unspecified: Secondary | ICD-10-CM | POA: Diagnosis not present

## 2020-10-10 DIAGNOSIS — G8220 Paraplegia, unspecified: Secondary | ICD-10-CM | POA: Diagnosis not present

## 2020-10-10 DIAGNOSIS — M869 Osteomyelitis, unspecified: Secondary | ICD-10-CM | POA: Diagnosis not present

## 2020-10-10 DIAGNOSIS — L89154 Pressure ulcer of sacral region, stage 4: Secondary | ICD-10-CM | POA: Diagnosis not present

## 2020-10-14 DIAGNOSIS — J309 Allergic rhinitis, unspecified: Secondary | ICD-10-CM | POA: Diagnosis not present

## 2020-10-14 DIAGNOSIS — L89154 Pressure ulcer of sacral region, stage 4: Secondary | ICD-10-CM | POA: Diagnosis not present

## 2020-10-14 DIAGNOSIS — N400 Enlarged prostate without lower urinary tract symptoms: Secondary | ICD-10-CM | POA: Diagnosis not present

## 2020-10-14 DIAGNOSIS — S24104S Unspecified injury at T11-T12 level of thoracic spinal cord, sequela: Secondary | ICD-10-CM | POA: Diagnosis not present

## 2020-10-14 DIAGNOSIS — I10 Essential (primary) hypertension: Secondary | ICD-10-CM | POA: Diagnosis not present

## 2020-10-14 DIAGNOSIS — N319 Neuromuscular dysfunction of bladder, unspecified: Secondary | ICD-10-CM | POA: Diagnosis not present

## 2020-10-14 DIAGNOSIS — L89893 Pressure ulcer of other site, stage 3: Secondary | ICD-10-CM | POA: Diagnosis not present

## 2020-10-14 DIAGNOSIS — D509 Iron deficiency anemia, unspecified: Secondary | ICD-10-CM | POA: Diagnosis not present

## 2020-10-14 DIAGNOSIS — G8220 Paraplegia, unspecified: Secondary | ICD-10-CM | POA: Diagnosis not present

## 2020-10-22 DIAGNOSIS — N400 Enlarged prostate without lower urinary tract symptoms: Secondary | ICD-10-CM | POA: Diagnosis not present

## 2020-10-22 DIAGNOSIS — J309 Allergic rhinitis, unspecified: Secondary | ICD-10-CM | POA: Diagnosis not present

## 2020-10-22 DIAGNOSIS — D509 Iron deficiency anemia, unspecified: Secondary | ICD-10-CM | POA: Diagnosis not present

## 2020-10-22 DIAGNOSIS — G8220 Paraplegia, unspecified: Secondary | ICD-10-CM | POA: Diagnosis not present

## 2020-10-22 DIAGNOSIS — L89154 Pressure ulcer of sacral region, stage 4: Secondary | ICD-10-CM | POA: Diagnosis not present

## 2020-10-22 DIAGNOSIS — I10 Essential (primary) hypertension: Secondary | ICD-10-CM | POA: Diagnosis not present

## 2020-10-22 DIAGNOSIS — N319 Neuromuscular dysfunction of bladder, unspecified: Secondary | ICD-10-CM | POA: Diagnosis not present

## 2020-10-22 DIAGNOSIS — S24104S Unspecified injury at T11-T12 level of thoracic spinal cord, sequela: Secondary | ICD-10-CM | POA: Diagnosis not present

## 2020-10-22 DIAGNOSIS — L89893 Pressure ulcer of other site, stage 3: Secondary | ICD-10-CM | POA: Diagnosis not present

## 2020-10-28 DIAGNOSIS — D509 Iron deficiency anemia, unspecified: Secondary | ICD-10-CM | POA: Diagnosis not present

## 2020-10-28 DIAGNOSIS — N319 Neuromuscular dysfunction of bladder, unspecified: Secondary | ICD-10-CM | POA: Diagnosis not present

## 2020-10-28 DIAGNOSIS — I10 Essential (primary) hypertension: Secondary | ICD-10-CM | POA: Diagnosis not present

## 2020-10-28 DIAGNOSIS — S24104S Unspecified injury at T11-T12 level of thoracic spinal cord, sequela: Secondary | ICD-10-CM | POA: Diagnosis not present

## 2020-10-28 DIAGNOSIS — J309 Allergic rhinitis, unspecified: Secondary | ICD-10-CM | POA: Diagnosis not present

## 2020-10-28 DIAGNOSIS — N400 Enlarged prostate without lower urinary tract symptoms: Secondary | ICD-10-CM | POA: Diagnosis not present

## 2020-10-28 DIAGNOSIS — L89154 Pressure ulcer of sacral region, stage 4: Secondary | ICD-10-CM | POA: Diagnosis not present

## 2020-10-28 DIAGNOSIS — G8220 Paraplegia, unspecified: Secondary | ICD-10-CM | POA: Diagnosis not present

## 2020-10-28 DIAGNOSIS — L89893 Pressure ulcer of other site, stage 3: Secondary | ICD-10-CM | POA: Diagnosis not present

## 2020-10-29 DIAGNOSIS — M869 Osteomyelitis, unspecified: Secondary | ICD-10-CM | POA: Diagnosis not present

## 2020-10-29 DIAGNOSIS — G8220 Paraplegia, unspecified: Secondary | ICD-10-CM | POA: Diagnosis not present

## 2020-10-29 DIAGNOSIS — L89154 Pressure ulcer of sacral region, stage 4: Secondary | ICD-10-CM | POA: Diagnosis not present

## 2020-11-04 ENCOUNTER — Encounter: Payer: Medicare HMO | Attending: Internal Medicine | Admitting: Internal Medicine

## 2020-11-04 ENCOUNTER — Other Ambulatory Visit: Payer: Self-pay

## 2020-11-04 DIAGNOSIS — J309 Allergic rhinitis, unspecified: Secondary | ICD-10-CM | POA: Diagnosis not present

## 2020-11-04 DIAGNOSIS — L97111 Non-pressure chronic ulcer of right thigh limited to breakdown of skin: Secondary | ICD-10-CM | POA: Diagnosis not present

## 2020-11-04 DIAGNOSIS — L97128 Non-pressure chronic ulcer of left thigh with other specified severity: Secondary | ICD-10-CM | POA: Insufficient documentation

## 2020-11-04 DIAGNOSIS — L89154 Pressure ulcer of sacral region, stage 4: Secondary | ICD-10-CM | POA: Insufficient documentation

## 2020-11-04 DIAGNOSIS — N319 Neuromuscular dysfunction of bladder, unspecified: Secondary | ICD-10-CM | POA: Diagnosis not present

## 2020-11-04 DIAGNOSIS — L97122 Non-pressure chronic ulcer of left thigh with fat layer exposed: Secondary | ICD-10-CM | POA: Diagnosis not present

## 2020-11-04 DIAGNOSIS — I10 Essential (primary) hypertension: Secondary | ICD-10-CM | POA: Diagnosis not present

## 2020-11-04 DIAGNOSIS — M8668 Other chronic osteomyelitis, other site: Secondary | ICD-10-CM | POA: Insufficient documentation

## 2020-11-04 DIAGNOSIS — L89893 Pressure ulcer of other site, stage 3: Secondary | ICD-10-CM | POA: Diagnosis not present

## 2020-11-04 DIAGNOSIS — S24104S Unspecified injury at T11-T12 level of thoracic spinal cord, sequela: Secondary | ICD-10-CM | POA: Diagnosis not present

## 2020-11-04 DIAGNOSIS — D509 Iron deficiency anemia, unspecified: Secondary | ICD-10-CM | POA: Diagnosis not present

## 2020-11-04 DIAGNOSIS — G8220 Paraplegia, unspecified: Secondary | ICD-10-CM | POA: Diagnosis not present

## 2020-11-04 DIAGNOSIS — Z933 Colostomy status: Secondary | ICD-10-CM | POA: Insufficient documentation

## 2020-11-04 DIAGNOSIS — N400 Enlarged prostate without lower urinary tract symptoms: Secondary | ICD-10-CM | POA: Diagnosis not present

## 2020-11-04 NOTE — Progress Notes (Signed)
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 being a quadriplegic. 04-26-16 Mr. Loe returns today for a violation of his stage III sacral pressure ulcer he denies any complaints concerns or issues over the past 2 weeks. He missed to changing dressing twice daily due to drainage although he states this is not an increase in drainage over the past 2 weeks. He does change his dressings independently. He admits to sitting in his motorized chair for no more than 2-3 hours at which time he transfers to bed and  rotates lateral position. 05/10/16; Giacomo Valone returns today for review of his stage III sacral pressure ulcer. He denies any concerns over the last 2 weeks although he seems to be running out of Aquacel Ag and on those days he uses Medihoney. He has [facility] was supplying his dressings. He still complains of drainage. He does his dressings independently. He has in his motorized chair for 2-3 hours that time other than that he offloads this. Dimensions of the wound are down 1 cm  in both directions. He underwent an aggressive debridement on his last visit of thick circumferential skin and subcutaneous tissue. It is possible at some point in the future he is going to need this done again 05/24/16; the patient returns today for review of his stage III sacral pressure ulcer. We have been using Aquacel Ag he tells me that he changes this up to twice a day. I'm not really certain of the reason for this frequency of changing. He has some involvement from the home health nurses but I think is doing most of the changing himself which I think because of his paraplegia would be a very difficult exercise. Nevertheless he states that there is "wetness". I am not sure if there is another dressing that we could easily changed that much. I'd wanted to change to [facility] but I'll need to have a sense of how frequent he would need to change this. 06/14/16; this is a patient returns for review of his stage III sacral pressure ulcer. We have been using Aquacel Ag and over the last 2 visits he has had extensive debridement so of the thick circumferential skin and subcutaneous tissue that surrounds this wound. In spite of this really absolutely no change in the condition of the wound warrants measurements. We have [facility] home health I believe changing the dressing on 3 occasions the patient states he does this on one occasion himself 06/28/16; this is a patient who has a fairly large stage III sacral  pressure ulcer. I changed him to [facility] from Aquacel 2 weeks ago. He BRAXLEY, BALANDRAN ([id number]) returns today in follow-up. In the meantime a nurse from [facility] has calledrequesting ordering of a wound VAC. He had this discussion before. The problem is the proximity of the lowest edge of this wound to the patient's anal opening roughly 3/4 of an inch. Can't see how this can be arranged. Apparently the nurse who is calling has a lot of experience, the question would be then when she is not available would be doing this. I would not have thought that this wound is not amenable to a wound VAC because of this reason 07/12/16; the patient comes in today and I have signed orders for a wound VAC. The home health team through [facility] is convinced that he can benefit from this even though there is close proximity to his anal opening beneath the gluteal clefts. The patient does not have a bowel regimen but states he has a bowel movement every 2 days this will also provide some problem with regards to the vac seal 07/26/16; the patient never did obtain a [place] wound VAC as he could not afford the $200 per month co-pay we have been using Hydrofera Blue now for 6 weeks or so. No major change in this wound at all. He is still not interested in the concept of plastic surgery. There changing the dressing every second day 08/09/16; the patient arrives with a wound precisely in the same situation. In keeping with the plan I outlined last time extensive debridement with an open curet the surface of this is not completely viable. Still has some degree of surrounding thick skin and subcutaneous tissue. No evidence of infection. Once again I have had a conversation with him about plastic surgery, he is simply not interested. 08/23/16; wound is really no different. Thick circumferential skin and subcutaneous tissue around the wound edge which is a lot better from debridement we did earlier in the year.  The  surface of the wound looks viable however with a curet there is definitely a gritty surface to this. We use Medihoney for a while, he could not afford Santyl. I don't think we could get a supply of Iodoflex. He talks a little more positively about the concept of plastic surgery which I've gone over with him today 08/31/16;; patient arrives in clinic today with the wound surface really no different there is no changes in dimensions. I debrided today surface on the left upper side of this wound aggressively week ago there is no real change here no evidence of epithelialization. The problem with debridement in the clinic is that he believes from this very liberally. We have been using Sorbact. 09/21/16; absolutely no change in the appearance or measurements of this wound. More recently I've been debrided in this aggressively and using sorbact to see if we could get to a better wound surface. Although this visually looks satisfactory, debridement reveals a very gritty surface to this. However even with this debridement and removal of thick nonviable skin and subcutaneous tissue from around the large amount of the circumference of this wound we have made absolutely no progress. This may be an offloading issue I'm just not completely certain. It has 2 close proximity in its inferior aspect to consider negative pressure therapy 10/26/16; READMISSION This patient called our clinic yesterday to report an odor in his wound. He had been to see plastic surgery at [facility] at our request after his last visit on 09/21/16; we have been seeing him for several months with a large stage III wound. He had been sent to general surgery for consideration of a colostomy, that appointment was not until mid June He comes in today with a temperature of 101. He is reporting an odor in the wound since last weekend. 01/10/17 Readmission: 01/10/17 On evaluation today it is noted that patient has been seen by plastic surgery at [facility] since  he was last evaluated here. They did discuss with him the possibility of a flap according to the notes but unfortunately at this point he was not quite ready to proceed with surgery and instead wanted to give the Wound VAC a try. In the hospital they were able to get a good seal on the Wound VAC. Unfortunately since that time they have been having trouble in regard to his current home health company keeping a simple on the Wound VAC. He would like to switch to a different home health company. With that being said it sounds as if the problem is that his wound VAC is not feeling at the lower portion of his back and he tells me that he can take some of the clear plastic and put over that area when the sill breaks and it will correct it for time. He has no discomfort or pain which is good news. He has been treated with IV vancomycin since he was last seen here and has an appointment with a infectious disease specialist in two days on 01/12/17. Otherwise he was transferred back to [place] for continuing to monitor and manage is wound as she progresses with a Wound VAC for the time being. 01/17/17 on evaluation today patient continues to show evidence of slight improvement with the Wound VAC fortunately there's no evidence of infection or otherwise worsening condition in general. Nonetheless we were unable to get him switch to [facility] in regard to home help from his current company. I'm not sure the reasoning behind but for some reason  he was not accepted as a patient with him. Continue to apply the Wound VAC which does still show that some maceration around the wound edges but the wound measurements were slightly improved. No fevers, chills, nausea, or vomiting noted at this time. 02/14/17; this patient I have not seen in 5 months although he has been readmitted to our clinic seen by our physician assistant Jeri Cos twice in early August. I have looked through [facility] notes care everywhere. The patient saw  plastic surgery in May [Dr. Bhatt}. The patient was sent to general surgery and ultimately had a colostomy placed. On 11/29/16. This was after he was admitted to [facility] sometime in May. An MRI of the pelvis on 5/23 showed osteomyelitis of the coccyx. An attempt was made to drain fluid that was not successful. He was treated with empiric broad-spectrum antibiotics VAC/cefepime/Flagyl starting on 11/02/16 with plans for a 6 week course. According to their notes he was sent to a nursing home. Was last seen by Dr. Myriam Jacobson of plastic surgery on 12/28/16. The first part of the note is a long dissertation about the difficulties finding adequate patients for flap closure of pressure ulcers. At that time the wound was noted to be stage IV based I think on underlying infection no exposed bone and healthy granulation tissue. 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 He was last seen by infectious disease 01/12/17 A Dr. Uvaldo Rising. His note says that Mr. Cournoyer was not interested in a flap closure for referring a trial of the wound VAC. As previously anticipated the wound VAC could not be maintained as an outpatient in the community. He is now using something similar to a Dakin's wet to dry recommended by [facility] VASHE solution. He is placing this twice a day himself. This is almost s hopeless setting in terms of heeling 02/28/17; he is using a Dakin's wet to dry. Most of the wound surface looks satisfactory however the deeper area over his coccyx now has exposed bone I'm not sure if I noted this last week. 03/21/17; patient is usingVASHE solution wet to dry which I gather is a variation on Dakin solution. He has home health changing this 3 times a week the other days he does this himself. His appointment with plastic surgery 04/18/17; patient continues to use a variant of Dakin solution I believe. His wound continues to have a clean viable surface. The 2 areas of exposed bone in the  center of this wound had closed over. He has an appointment with plastic surgery on December 5 at which time I hope that there'll be a plan for myocutaneous flap closure In looking through [facility] link I couldn't find any more plastic surgery appointments. I did come across the fact that he is been followed by hematology for a microcytic hypochromic anemia. He had a reasonably normal looking hemoglobin electrophoresis. His iron level was 10 and according to the patient he is going for IV iron infusions starting tomorrow. He had a sedimentation rate of 74. More problematically from a pure wound care point of view his albumin was 2.7 earlier this month 05/17/17; this is a patient I follow monthly. He has a large now stage IV wound over his bilateral buttocks with close proximity to his anal MIKAL, BLASDELL A. ([id number]) opening. More recently he has developed a large area with exposed bone in the center of this probably secondary to the underlying osteomyelitis E had in the summer. He  also follows with Dr. Myriam Jacobson at [facility] who is plastic surgery. He had an appointment earlier this month and according to the patient Dr. Myriam Jacobson does not want to proceed with any attempted flap closure. Although I do not have current access to her note in care everywhere this is likely due to exposed bone. Again according to the patient they did a bone biopsy. He is still using a variant of Dakin solution changing twice a day. He has home health. The patient is not able to give me a firm answer about how long he spends on this in his wheelchair The patient also states that Dr. Myriam Jacobson wanted to reconsider a wound VAC. I really don't see this as a viable option at least not in the outpatient setting. The wound itself is frankly to close to his anal opening to maintain a seal. The last time we tried to do this home health was unable to manage it. It might be possible to maintain a wound VAC in this setting outside of the home  such as a skilled nursing facility or an [facility] however I am doubtful about this even in that setting **** READMISSION 09/21/17-He is here for evaluation of stage IV sacral ulcer. Since his last evaluation here in December he has completed treatment for sacral osteomyelitis. He was at [place] for IV therapy and NPWT dressing changes. He was discharged, with home health services, in February. He admits that while in the skilled facility he had "80%" success with maintaining dressing, since discharge he has had approximately "40%" success with maintaining wound VAC dressing. We discussed at length that this is not a safe or recommended option. We will apply Dakin's wet to dry dressing daily and he will follow-up next week. He is accompanied today by his sister who is willing to assist in dressing changes; they will discuss the social issue as he feels he is capable of changing dressing daily when home health is not able. 09/28/17-He is here in follow-up evaluation for stage IV sacral pressure ulcer. He has been using the Dakin's wet-to-dry daily; he continues with home health. He is not accompanied by anyone at this visit. He will follow up in two weeks per his request/preference. 10/12/17 on evaluation today patient appears to be doing very well. The Dakin solution went to dry packings do seem to be helping him as far as the sacral wound is concerned I'm not seeing anything that has me more concerned as far as infection or otherwise is concerned. Overall I'm pleased with the appearance of the wound. 10/26/17-He is here in follow up evaluation for a stage IV sacral ulcer. He continues with daily Dakin's wet-to-dry. He is voicing no complaints or concerns. He will follow-up in 2 weeks 11/16/17-He is here for follow up evaluation for a palliative stage IV sacral pressure ulcer. We will continue with Dakin's wet-to-dry. He will follow- up in 4 weeks. He is expressing concern/complaint regarding new  bed that has arrived, stating he is unable to manipulate/maneuver it due to the bed crank being at the foot of the bed. 12/14/17-He is here for evaluation for palliative stage IV sacral ulcer. He is voicing no complaints or concerns. We will continue with one-to-one ratio of saline and dakins. He will follow-up in 3 weeks 01/04/18-He is here in follow-up evaluation for palliative stage IV sacral ulcer. He is inquiring about reinstating the negative pressure wound therapy. We discussed at length that the negative pressure wound therapy in the home setting  has not been successful for him repeatedly with loss of cereal and unavailability of 24/7 help; reminded him that home health is not available 24/7 when loss of seal occurs. He does verbalize understanding to this and does not pursue. We also discussed the palliative nature of this ulcer (given no significant change/improvement in measurement/appearance, not a candidate for muscle flap per plastic surgery, and continued independent living) and that the goal is for maintenance, decrease in infection and minimizing/avoiding deterioration given that he is independent in his care, does not have home health and requires daily dressing changes secondary to drainage amount. He is inquiring about a wound clinic in [facility], I have informed him that I am unfamiliar with that clinic but that he is encouraged to seek another opinion if that is his desire. We will continue with dakins and he will follow up in three weeks 01/25/18-He is here in follow-up evaluation for palliative stage IV sacral ulcer. He continues with Dakin's/saline 1:1 mixture wet to dry dressing changes. He states he has an appointment at [facility] on 9/17 for evaluation of surgical intervention/closure of the sacral ulcer. He will follow-up here in 4 weeks Readmission: 07/16/2019 upon evaluation today patient appears to be doing really about the same as when he was previously seen here in the wound  care center. He most recently was a patient of [facility] back when she was still working here in the center but had been referred to [facility] for consideration of a flap. With that being said the surgeon there at [facility] stated that this was too large for her flap and they have been attempting to get this smaller in order to be able to proceed with a flap. Nonetheless unfortunately he has had a cycle of going back and forth between the osteomyelitis flaring and then sent him back and then making a little bit of progress only to be sent back again. It sounds like most recently they have been using a Iodoflex type dressing at this point which does not seem to have done any harm by any means. With that being said this wound seems to be quite large for using Iodoflex throughout and subsequently I think he may do much better with the use of Vioxx moistened gauze which would be safe for the new tissue growing and also keep the wound quite nice and clean. The patient is not opposed to this he in fact states that his home health nurse had mentioned this as a possibility as well. 07/23/2019 upon evaluation today patient actually appears to be doing very well in regard to his sacral wound. In fact this is much healthier the measurements not terribly different but again with a wound like this at home necessarily expect a a lot of change as far as the overall measurements are concerned in just 1 week's time. This is going be a much longer term process at this point. With that being said I do think that he is very healthy appearing as far as the base of the wound is concerned. 08/06/2019 upon evaluation today patient actually appears to be doing about the same with regard to his wound to be honest. There is really not a significant improvement overall based on what I am seeing today. Fortunately there is no signs of significant systemic infection. No fevers, chills, nausea, vomiting, or diarrhea. With that being said there is  odor to the drainage from the wound and subsequently also what appears to be increased drainage based on what we  are seeing today as well as what his home health nurse called [place] about that she was concerned with as well. 08/13/2019 upon evaluation today patient appears to be doing about the same at this point with regard to his wound although I think the dressing may be a little less drainage wise compared to what it was previous. Fortunately there is no signs of active infection at this time. No fevers, chills, nausea, vomiting, or diarrhea. He has been taking the antibiotic for only 3 days. 08/23/2019 upon evaluation today patient appears to be doing not nearly as well with regard to his wound. In general really not making a lot of progress which is unfortunate. There does not appear to be any signs of active infection at this time. No fevers, chills, nausea, vomiting, or diarrhea. With that being said the patient unfortunately does seem to be experiencing continued epiboly around the edges of the wound as well as significant scar tissue he has been in the majority of his day sitting in his chair which is likely a big reason for all of this. Fortunately there is no signs of active infection at this time. No fevers, chills, nausea, vomiting, or diarrhea. 08/29/2019 upon evaluation today patient's wound actually appears to be showing some signs of improvement. The region of few areas of new skin growth around the edges of the wound even where he has some of the epiboly. This is actually good news and I think that he has been very aggressive and offloading over the past week since we showed him the pictures of his wound last week. There still may be a chance that he is Bradley [name], Bradley [name]. ([id number]) going require some sharp debridement to clear away some of these edges but to be honest I think that is can be quite an undertaking. The main reason is that he has a lot of thickened scar tissue and it is good to  bleed quite significantly in my opinion. The I think if organ to do that we may even need to have a portable or disposable cautery in order to make sure that we are able to get the area completely sealed up as far as bleeding is concerned. I do have one that we can utilize. However being that the wound looks so much better right now would like to give this 2 weeks to see how things stand and look at that point before making any additional recommendations. 09/12/2019 upon evaluation today patient appears to be making some progress as far as offloading is concerned. He still has a substantial wound but nonetheless he tells me he is off loading much more aggressively at this point. This is obviously good news. No fevers, chills, nausea, vomiting, or diarrhea. 4/22; I have not seen this patient in quite some time. No major change in the condition of the wound or its wound volume. Surrounding maceration of the skin around the edges of the wound. The wound is fairly substantial. To close to the anal opening to consider a wound VAC. He had extensive trials of plastic surgery at [facility] which really does not result in any improvement. His wound bed is however clean 10/10/2019 upon evaluation today patient appears to be doing well with regard to the wound in the sacral region. He has been tolerating the dressing changes without complication. Fortunately there is no signs of infection and he actually has some epithelial growth noted as well. He tells me has been trying to continue to offload  this area which is excellent that is what he needs to do most as far as what he can have in his control. Otherwise I feel like the collagen with a saline moistened gauze behind has been beneficial for him. 5/20; collagen and normal saline wet-to-dry. Although the tissue when this large sacral wound looks reasonable there is been absolutely no benefit to the overall closure. He has macerated skin around this. He tells me  that he spends a large part of the day up in the wheelchair. He still has home health 11/07/2019 upon evaluation today patient appears to be doing well with regard to his wounds at this point. Fortunately there is no signs of active infection at this time. Overall I feel like his sacral wound in general appears to be doing quite nicely. I am very pleased with overall how things are progressing and I feel like the patient is making excellent progress towards resolution. With that being said he still has a long ways to go before we expect to see this completely healed. 11/21/2019 upon evaluation today patient appears to be doing about the same in regard to his sacral wound. Unfortunately he is really not making much in the way of improvement when I questioned him about how much time he is really spending sitting on his bottom he tells me that the majority of his day is mainly in that position though he does spend some time in bed. It does not sound like it is enough however neither does it look like it is enough. 12/24/2019 upon evaluation today patient appears to be doing really about the same there is no significant improvement overall in his wound bed. Last time he was seen here we did have a discussion about the possibility of seeing if I can get a surgeon to evaluate and potentially clear away some of the thickened edges of the wound in order to allow this wound to potentially have a chance of closing with a wound VAC. Right now we could not even get a wound VAC to seal with some of the rolled and thickened edges. With that being said elected to the patient to think about this to discuss today. At this point the patient does want to see if I could make the referral to potentially see if the surgeon would be able to perform this and subsequently he agrees to go to a skilled nursing facility following if they are able to do this in order to allow for a wound VAC and to get this wound to heal more  effectively and quickly. In my opinion the only way that I would recommend that he see the surgeon to clear away the edges would be if he is also in agreement with going to skilled nursing facility as I know he cannot appropriately offload at home to take care of this and that setting. 01/07/2020 on evaluation today patient appears to be doing about the same in regard to his wound. There is no signs of active infection at this time which is great news. All in all he is really doing about the same. We have made referral to surgery to see if they can potentially perform debridement to clear away some of the scarred edging of the wound so we could potentially see about a wound VAC to try to help this heal more efficiently. Again we have discussed that if this occurs he would be in a skilled nursing facility following or release would need to be in  order to appropriately offload. Nonetheless we are still in the process of working that referral. 01/21/2020 upon evaluation today patient appears to be doing about the same in regard to his sacral wound. He did see Dr. Lysle Pearl Dr. Ines Bloomer feeling was that there was not anything surgically he could offer at this point as he did not feel like that a wound VAC would likely be appropriate for the patient. With that being said he did recommend potential for trying to get the patient into a skilled nursing facility as an outpatient and they will get me working on that as well. Fortunately there is no signs of active infection at this time. 11/10; this is a patient that has not been seen here in quite a bit of time while we were in our alternative venue. As well I have not seen this patient in probably over 2 years. As I remember things with him he has had this pressure ulcer encompassing his lower sacrum and coccyx and the surrounding buttocks for a prolonged period of time. When he first came here the surface looked fairly good and the wound was smaller we attempted a  wound VAC but we could never keep this in place. Because of nonresponse of the wound to topical dressings we referred him to [facility] plastic surgery where preparation was being made for flap closure. Intact he had a colostomy however after that he was deemed to have 2 large wound for flap closure. He did have osteomyelitis in the coccyx area in 2019. He is using wet-to-dry he has home health. They are managing his ostomy supplies as well. The wound is substantially larger than what I remember. Versus last time he was here to wound months or so ago he has an unstageable wound on the left upper thigh posteriorly. He tells me he is in his chair quite a bit especially recently 12/1; patient comes back to clinic. He did not get the lab work done apparently home health would not do this they referred him to the lab where he is apparently having a CBC checked because he is on iron infusions. We will try to get the lab work I ordered done there. He also did not get the x-ray he says he does not remember the discussion. The issue is that this large wound has probing areas to bone. He may have underlying osteomyelitis again he was treated for this previously I think in 2019 12/15; the patient did have lab work done. Notable for iron deficiency anemia with a hemoglobin of 9.7 although he is following with hematology for this his CRP was 7.1 which is up from his previous value. sedimentation rate of 91 which is also higher. However he maintains a relatively high sedimentation rate. Most disturbingly was an albumin of 2.8 although this is also somewhat higher than the 2.2 we had previously. With some difficulty I think a home health nurse actually made him some Dakin's solution he is using a Dakin's wet-to-dry to the large sacral wound. He has the area on the left upper thigh as well 06/17/2020; I follow this patient on a monthly basis. 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 At  one point he had underlying osteomyelitis here and he received antibiotic therapy. I believe this was done through [facility] We have been using palliative Dakin's solution wet-to-dry on this which is really This very large wound about the same with healthy surface. No active infection that is  obvious. He has developed an area on the left posterior thigh and now a mirror image area on the right posterior thigh Bradley [name], Bradley A. ([id number]) noted pressure injuries from the wheelchair. This occurs even though he has a Roho cushion which is apparently new I have been forwarded lab work from 05/15/2020. At that point his hemoglobin was 9.2 down from 13.8 on 10/5 this is microcytic hypochromic. He has known iron deficiency although the exact cause of this is never really been clear to me. He follows with hematology oncology and has been receiving IV iron he is supposed to follow-up in mid January and I told the patient this. From wound care point of view his albumin is 2.9 sedimentation rate is 74. His sedimentation rate has always been elevated in this range however his C-reactive protein was 82 which is way above anything previously measured. I see that on 05/08/2020 this was 7.1 I am not sure why the rapid discrepancy. He is supposed to have an x-ray of his lower sacrum looking for evidence of osteomyelitis that he was previously treated for. Is possible he would also require more advanced imaging such as another C CT or MRI He comes in also with paperwork for a [place]ron air-fluidized bed with a trapeze bar. I asked him if he would be able to transfer in and out of this bed . I am not opposed to ordering it, but I do not want to make his transfers more difficult than they already are. He has a [employer] fast streamer 07/15/2020; he is managed to get his [place]ron air-fluidized bed. He has a new electric wheelchair with a wheelchair cushion. The x-ray I did showed some sacral bone destruction although this did not seem  progressive him last time. He was treated for osteomyelitis in this area previously in 2018. His sedimentation rate is 91 CRP 7.1 albumin 2.8. We are using Dakin's wet-to-dry. He has [facility] home health 4/6; 72-month follow-up. He has a Cl[place]ron air-fluidized bed. He has the wheelchair cushion. His major wound on the coccyx looks somewhat better including including both of [place] my nurse and my feeling that it is closed in somewhat. Wound is still too close to his anal opening to consider a wound VAC. At 1 point we were trying to get him into [facility] plastics to get a flap closure but for 1 reason or another this never moved forward I was never really certain what the issue was. He has an area on the left upper thigh in the gluteal fold. This is a clean wound. 6/1; 104-month follow-up. I follow this man on a palliative basis he has a chronic stage IV wound over the lower sacrum coccyx and surrounding buttocks. He has an air-fluidized bed and a wheelchair cushion but he spends more time in the wheelchair than he does off this area by his own admission. I do not think the area is really changed that much although it is probably deeper in 1 section. He does tell us that he was discharged from home health because of lack of change of the wound. This was [employer]. He was treated for underlying osteomyelitis several years ago and he may have underlying chronic osteomyelitis. I do not believe he ever got the x-ray I ordered Objective Constitutional Sitting or standing Blood Pressure is within target range for patient.. Pulse regular and within target range for patient.Marland Kitchen Respirations regular, non- labored and within target range.. Temperature is normal and within the target range for the patient.. Vitals  Time Taken: 10:47 AM, Temperature: 98.3 F, Pulse: 69 bpm, Respiratory Rate: 18 breaths/min, Blood Pressure: 116/73 mmHg. Respiratory Respiratory effort is easy and symmetric bilaterally. Rate is normal at rest  and on room air.Marland Kitchen Psychiatric No evidence of depression, anxiety, or agitation. Calm, cooperative, and communicative. Appropriate interactions and affect.. General Notes: Wound exam; the area on the lower sacrum and buttocks. I think the area is deeper on the lower sacral segment and coccyx there is not much tissue over bone and I talked to the patient about this. There is no clear infection. Macerated areas especially inferiorly. Almost polyps of skin inferiorly also. No evident evidence of surrounding soft tissue infection Integumentary (Hair, Skin) Wound #10 status is Open. Original cause of wound was Pressure Injury. The date acquired was: 06/07/2015. The wound has been in treatment 68 weeks. The wound is located on the Midline Sacrum. The wound measures 11.5cm length x 6cm width x 4.5cm depth; 54.192cm^2 area and 243.866cm^3 volume. There is bone and Fat Layer (Subcutaneous Tissue) exposed. There is no tunneling or undermining noted. There is a large amount of serosanguineous drainage noted. The wound margin is epibole. There is large (67-100%) red, pink granulation within the wound bed. There is a small (1-33%) amount of necrotic tissue within the wound bed including Adherent Slough. Wound #12 status is Open. Original cause of wound was Gradually Appeared. The date acquired was: 03/06/2020. The wound has been in treatment 29 weeks. The wound is located on the Left,Proximal Upper Leg. The wound measures 2.7cm length x 0.5cm width x 0.3cm depth; 1.06cm^2 area and 0.318cm^3 volume. There is Fat Layer (Subcutaneous Tissue) exposed. There is no tunneling or undermining noted. There is a medium amount of serosanguineous drainage noted. The wound margin is flat and intact. There is large (67-100%) red granulation within the wound bed. There is a small (1-33%) amount of necrotic tissue within the wound bed including Adherent Slough. Wound #13 status is Healed - Epithelialized. Original cause of wound was  Gradually Appeared. The date acquired was: 06/17/2020. The wound has been in treatment 20 weeks. The wound is located on the Right,Posterior Upper Leg. The wound measures 0cm length x 0cm width x 0cm depth; 0cm^2 area and 0cm^3 volume. There is no tunneling or undermining noted. There is a none present amount of drainage noted. There is no granulation within the wound bed. There is no necrotic tissue within the wound bed. 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 Plan Follow-up Appointments: Return Appointment in: - 2 months Bathing/ Shower/ Hygiene: May shower; gently cleanse wound with antibacterial soap, rinse and pat dry prior to dressing wounds Off-Loading: Hospital bed/mattress Air fluidized (Group 3) Turn and reposition every 2 hours Additional Orders / Instructions: Follow Nutritious Diet and Increase Protein Intake WOUND #10: - Sacrum Wound Laterality: Midline Cleanser: Vashe 5.8 (oz) 1 x Per Day/30 Days Discharge Instructions: Use vashe 5.8 (oz) as directed-patient to self purchase Primary Dressing: Gauze (DME) (Generic) 1 x Per Day/30 Days Discharge Instructions: Moistened with Vashe/Dakins and packed lightly into wound bed Secondary Dressing: ABD Pad 5x9 (in/in) (DME) (Generic) 1 x Per Day/30 Days Discharge Instructions: Cover with ABD pad Secondary Dressing: Xtrasorb Medium 4x5 (in/in) (DME) (Generic) 1 x Per Day/30 Days Discharge Instructions: Apply to wound as directed. Do not cut. Secured With: 65M Medipore H Soft Cloth Surgical Tape, 2x2 (  in/yd) (DME) (Generic) 1 x Per Day/30 Days Discharge Instructions: Secure dressing WOUND #12: - Upper Leg Wound Laterality: Left, Proximal Cleanser: Normal Saline 3 x Per Week/30 Days Discharge Instructions: Wash your hands with soap  and water. Remove old dressing, discard into plastic bag and place into trash. Cleanse the wound with Normal Saline prior to applying a clean dressing using gauze sponges, not tissues or cotton balls. Do not scrub or use excessive force. Pat dry using gauze sponges, not tissue or cotton balls. Primary Dressing: AquacelAg Advantage Dressing, 2X2 (in/in) (DME) (Generic) 3 x Per Week/30 Days Discharge Instructions: Apply to wound as directed Secondary Dressing: ABD Pad 5x9 (in/in) (DME) (Generic) 3 x Per Week/30 Days Secured With: 30M Medipore H Soft Cloth Surgical Tape, 2x2 (in/yd) (DME) (Generic) 3 x Per Week/30 Days 1. The patient has been using a Vashe solution wet to dry. The wound is too big to consider other dressings but truthfully I do not think any other dressing is going to help this. The area is too close to his anal opening to consider a wound VAC. We did try this several years ago and it did not work we could not maintain a seal 2. The only other option here would be to reconsult plastic surgery however the patient would have to be put in a rehab facility for a prolonged period of time to offload this. He does not seem agreeable to this 3. He might have chronic osteomyelitis under this. I have not been able to get him to x-ray this. I did do inflammatory markers which were elevated some months ago although not in a consistent pattern. 4. I view this is a palliative wound situation. I will see him back in 2 months I told him that if things are changing and he wants [place] to look at it to call and make an earlier appoint Electronic Signature(s) Signed: 11/04/2020 5:08:05 PM By: Linton Ham MD Entered By: Linton Ham on 11/04/2020 12:38:23 [name] ([id number]) -------------------------------------------------------------------------------- [place] Details Patient Name: Bradley Loa A. Date of Service: 11/04/2020 Medical Record Number: [id number] Patient Account Number:  [id number] Date of Birth/Sex: [birth year] (75 y.o. 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

## 2020-11-05 DIAGNOSIS — S81809A Unspecified open wound, unspecified lower leg, initial encounter: Secondary | ICD-10-CM | POA: Diagnosis not present

## 2020-11-05 DIAGNOSIS — S31000A Unspecified open wound of lower back and pelvis without penetration into retroperitoneum, initial encounter: Secondary | ICD-10-CM | POA: Diagnosis not present

## 2020-11-05 NOTE — Progress Notes (Signed)
MAYES, SANGIOVANNI ([id number]) Visit Report for 11/04/2020 Arrival Information Details Patient Name: Bradley [name], TIERNO. Date of Service: 11/04/2020 10:45 AM Medical Record Number: [id number] Patient Account Number: [id number] Date of Birth/Sex: 09-22-1945 (75 y.o. M) Treating RN: Carlene Coria Primary Care Darris Staiger: [name] Other Clinician: Jeanine Luz Referring Ota Ebersole: [name] Treating Khaiden Segreto/Extender: Tito Dine in Treatment: 11 Visit Information History Since Last Visit All ordered tests and consults were completed: No Patient Arrived: Wheel Chair Added or deleted any medications: No Arrival Time: 10:36 Any new allergies or adverse reactions: No Accompanied By: self Had a fall or experienced change in No Transfer Assistance: None activities of daily living that may affect Patient Identification Verified: Yes risk of falls: Secondary Verification Process Completed: Yes Signs or symptoms of abuse/neglect since last visito No Patient Requires Transmission-Based Precautions: No Hospitalized since last visit: No Patient Has Alerts: No Implantable device outside of the clinic excluding No cellular tissue based products placed in the center since last visit: Has Dressing in Place as Prescribed: Yes Pain Present Now: No Electronic Signature(s) Signed: 11/05/2020 4:50:12 PM By: Carlene Coria RN Entered By: Carlene Coria on 11/04/2020 10:47:28 Bradley [name] ([id number]) -------------------------------------------------------------------------------- Clinic Level of Care Assessment Details Patient Name: Bradley Loa A. Date of Service: 11/04/2020 10:45 AM Medical Record Number: [id number] Patient Account Number: [id number] Date of Birth/Sex: 10-09-1945 (75 y.o. M) Treating RN: Dolan Amen Primary Care Landyn Buckalew: [name] Other Clinician: Jeanine Luz Referring Dynasty Holquin: [name] Treating Emeree Mahler/Extender: Tito Dine in Treatment: 54 Clinic Level of Care Assessment Items TOOL 4 Quantity Score X - Use when only an EandM is performed on FOLLOW-UP visit 1 0 ASSESSMENTS - Nursing Assessment / Reassessment X - Reassessment of Co-morbidities (includes updates in patient status) 1 10 X- 1 5 Reassessment of Adherence to Treatment Plan ASSESSMENTS - Wound and Skin Assessment / Reassessment []  - Simple Wound Assessment / Reassessment - one wound 0 X- 2 5 Complex Wound Assessment / Reassessment - multiple wounds []  - 0 Dermatologic / Skin Assessment (not related to wound area) ASSESSMENTS - Focused Assessment []  - Circumferential Edema Measurements - multi extremities 0 []  - 0 Nutritional Assessment / Counseling / Intervention []  - 0 Lower Extremity Assessment (monofilament, tuning fork, pulses) []  - 0 Peripheral Arterial Disease Assessment (using hand held doppler) ASSESSMENTS - Ostomy and/or Continence Assessment and Care []  - Incontinence Assessment and Management 0 []  - 0 Ostomy Care Assessment and Management (repouching, etc.) PROCESS - Coordination of Care X - Simple Patient / Family Education for ongoing care 1 15 []  - 0 Complex (extensive) Patient / Family Education for ongoing care []  - 0 Staff obtains Programmer, systems, Records, Test Results / Process Orders []  - 0 Staff telephones HHA, Nursing Homes / Clarify orders / etc []  - 0 Routine Transfer to another Facility (non-emergent condition) []  - 0 Routine Hospital Admission (non-emergent condition) []  - 0 New Admissions / Biomedical engineer / Ordering NPWT, Apligraf, etc. []  - 0 Emergency Hospital Admission (emergent condition) X- 1 10 Simple Discharge Coordination []  - 0 Complex (extensive) Discharge Coordination PROCESS - Special Needs []  - Pediatric / Minor Patient Management 0 []  - 0 Isolation Patient Management []  - 0 Hearing / Language / Visual special needs []  - 0 Assessment of Community assistance (transportation,  D/C planning, etc.) []  - 0 Additional assistance / Altered mentation []  - 0 Support Surface(s) Assessment (bed, cushion, seat, etc.) INTERVENTIONS - Wound Cleansing / Measurement Bradley [name], Bradley A. ([id number]) []  -  0 Simple Wound Cleansing - one wound X- 2 5 Complex Wound Cleansing - multiple wounds []  - 0 Wound Imaging (photographs - any number of wounds) []  - 0 Wound Tracing (instead of photographs) []  - 0 Simple Wound Measurement - one wound X- 2 5 Complex Wound Measurement - multiple wounds INTERVENTIONS - Wound Dressings []  - Small Wound Dressing one or multiple wounds 0 X- 1 15 Medium Wound Dressing one or multiple wounds []  - 0 Large Wound Dressing one or multiple wounds []  - 0 Application of Medications - topical []  - 0 Application of Medications - injection INTERVENTIONS - Miscellaneous []  - External ear exam 0 []  - 0 Specimen Collection (cultures, biopsies, blood, body fluids, etc.) []  - 0 Specimen(s) / Culture(s) sent or taken to Lab for analysis []  - 0 Patient Transfer (multiple staff / Civil Service fast streamer / Similar devices) []  - 0 Simple Staple / Suture removal (25 or less) []  - 0 Complex Staple / Suture removal (26 or more) []  - 0 Hypo / Hyperglycemic Management (close monitor of Blood Glucose) []  - 0 Ankle / Brachial Index (ABI) - do not check if billed separately X- 1 5 Vital Signs Has the patient been seen at the hospital within the last three years: Yes Total Score: 90 Level Of Care: New/Established - Level 3 Electronic Signature(s) Signed: 11/04/2020 4:52:16 PM By: Georges Mouse, Minus Breeding RN Entered By: Georges Mouse, Minus Breeding on 11/04/2020 11:36:15 Bradley [name] ([id number]) -------------------------------------------------------------------------------- Encounter Discharge Information Details Patient Name: Bradley Loa A. Date of Service: 11/04/2020 10:45 AM Medical Record Number: [id number] Patient Account Number: [id number] Date of Birth/Sex:  [birth year] (75 y.o. M) Treating RN: Cornell Barman Primary Care Dezarai Prew: [name] Other Clinician: Jeanine Luz Referring Anntonette Madewell: [name] Treating Christyann Manolis/Extender: Tito Dine in Treatment: 82 Encounter Discharge Information Items Discharge Condition: Stable Ambulatory Status: Wheelchair Discharge Destination: Home Transportation: Private Auto Accompanied By: self Schedule Follow-up Appointment: Yes Clinical Summary of Care: Electronic Signature(s) Signed: 11/04/2020 4:53:17 PM By: Jeanine Luz Entered By: Jeanine Luz on 11/04/2020 11:57:30 Bradley [name] ([id number]) -------------------------------------------------------------------------------- Lower Extremity Assessment Details Patient Name: Bradley Loa A. Date of Service: 11/04/2020 10:45 AM Medical Record Number: [id number] Patient Account Number: [id number] Date of Birth/Sex: 08/28/1945 (75 y.o. M) Treating RN: Carlene Coria Primary Care Dean Goldner: [name] Other Clinician: Jeanine Luz Referring Gery Sabedra: [name] Treating Mercury Rock/Extender: Tito Dine in Treatment: 9 Electronic Signature(s) Signed: 11/05/2020 4:50:12 PM By: Carlene Coria RN Entered By: Carlene Coria on 11/04/2020 11:01:24 Bradley [name] ([id number]) -------------------------------------------------------------------------------- Multi Wound Chart Details Patient Name: Bradley Loa A. Date of Service: 11/04/2020 10:45 AM Medical Record Number: [id number] Patient Account Number: [id number] Date of Birth/Sex: 05/02/1946 (75 y.o. M) Treating RN: Dolan Amen Primary Care Sherol Sabas: [name] Other Clinician: Jeanine Luz Referring Fay Swider: [name] Treating Ayiden Milliman/Extender: Tito Dine in Treatment: 38 Vital Signs Height(in): Pulse(bpm): 4 Weight(lbs): Blood Pressure(mmHg): 116/73 Body Mass Index(BMI): Temperature(F):  98.3 Respiratory Rate(breaths/min): 18 Photos: Wound Location: Midline Sacrum Left, Proximal Upper Leg Right, Posterior Upper Leg Wounding Event: Pressure Injury Gradually Appeared Gradually Appeared Primary Etiology: Pressure Ulcer Pressure Ulcer Pressure Ulcer Comorbid History: Anemia, Hypertension, History of Anemia, Hypertension, History of Anemia, Hypertension, History of pressure wounds, Rheumatoid pressure wounds, Rheumatoid pressure wounds, Rheumatoid Arthritis, Paraplegia Arthritis, Paraplegia Arthritis, Paraplegia Date Acquired: 06/07/2015 03/06/2020 06/17/2020 Weeks of Treatment: 68 29 20 Wound Status: Open Open Healed - Epithelialized Measurements L x W x D (cm) 11.5x6x4.5 2.7x0.5x0.3 0x0x0 Area (cm) : 54.192 1.06 0  Volume (cm) : 243.866 0.318 0 % Reduction in Area: -5.20% 93.30% 100.00% % Reduction in Volume: 21.10% 79.80% 100.00% Classification: Category/Stage IV Category/Stage III Category/Stage II Exudate Amount: Large Medium None Present Exudate Type: Serosanguineous Serosanguineous N/A Exudate Color: red, brown red, brown N/A Wound Margin: Epibole Flat and Intact N/A Granulation Amount: Large (67-100%) Large (67-100%) None Present (0%) Granulation Quality: Red, Pink Red N/A Necrotic Amount: Small (1-33%) Small (1-33%) None Present (0%) Exposed Structures: Fat Layer (Subcutaneous Tissue): Fat Layer (Subcutaneous Tissue): Fascia: No Yes Yes Fat Layer (Subcutaneous Tissue): Bone: Yes Fascia: No No Fascia: No Tendon: No Tendon: No Tendon: No Muscle: No Muscle: No Muscle: No Joint: No Joint: No Joint: No Bone: No Bone: No Epithelialization: None Small (1-33%) Large (67-100%) Treatment Notes Electronic Signature(s) Signed: 11/04/2020 5:08:05 PM By: Linton Ham MD Entered By: Linton Ham on 11/04/2020 11:45:26 Bradley [name] ([id number]) -------------------------------------------------------------------------------- Multi-Disciplinary Care Plan  Details Patient Name: Bradley [name], Bradley A. Date of Service: 11/04/2020 10:45 AM Medical Record Number: [id number] Patient Account Number: [id number] Date of Birth/Sex: 1945/07/18 (75 y.o. M) Treating RN: Dolan Amen Primary Care Kashvi Prevette: [name] Other Clinician: Jeanine Luz Referring Mick Tanguma: [name] Treating Regnia Mathwig/Extender: Tito Dine in Treatment: 47 Active Inactive Abuse / Safety / Falls / Self Care Management Nursing Diagnoses: Impaired physical mobility Goals: Patient will not develop complications from immobility Date Initiated: 07/16/2019 Target Resolution Date: 02/07/2020 Goal Status: Active Interventions: Assess Activities of Daily Living upon admission and as needed Notes: Pressure Nursing Diagnoses: Knowledge deficit related to causes and risk factors for pressure ulcer development Goals: Patient will remain free from development of additional pressure ulcers Date Initiated: 07/16/2019 Target Resolution Date: 02/07/2020 Goal Status: Active Interventions: Provide education on pressure ulcers Notes: Electronic Signature(s) Signed: 11/04/2020 4:52:16 PM By: Georges Mouse, Minus Breeding RN Entered By: Georges Mouse, Minus Breeding on 11/04/2020 11:28:16 Bradley [name] ([id number]) -------------------------------------------------------------------------------- Pain Assessment Details Patient Name: Bradley Loa A. Date of Service: 11/04/2020 10:45 AM Medical Record Number: [id number] Patient Account Number: [id number] Date of Birth/Sex: 08/15/1945 (75 y.o. M) Treating RN: Carlene Coria Primary Care Chanson Teems: [name] Other Clinician: Jeanine Luz Referring Alijah Akram: [name] Treating Kerra Guilfoil/Extender: Tito Dine in Treatment: 34 Active Problems Location of Pain Severity and Description of Pain Patient Has Paino No Site Locations Pain Management and Medication Current Pain Management: Electronic  Signature(s) Signed: 11/05/2020 4:50:12 PM By: Carlene Coria RN Entered By: Carlene Coria on 11/04/2020 10:47:55 Bradley [name] ([id number]) -------------------------------------------------------------------------------- Patient/Caregiver Education Details Patient Name: Bradley Loa A. Date of Service: 11/04/2020 10:45 AM Medical Record Number: [id number] Patient Account Number: [id number] Date of Birth/Gender: [birth year] (75 y.o. M) Treating RN: Dolan Amen Primary Care Physician: [name] Other Clinician: Jeanine Luz Referring Physician: [name] Treating Physician/Extender: Tito Dine in Treatment: 15 Education Assessment Education Provided To: Patient Education Topics Provided Wound/Skin Impairment: Methods: Explain/Verbal Responses: State content correctly Electronic Signature(s) Signed: 11/04/2020 4:52:16 PM By: Georges Mouse, Minus Breeding RN Entered By: Georges Mouse, Minus Breeding on 11/04/2020 11:36:35 Bradley [name] ([id number]) -------------------------------------------------------------------------------- Wound Assessment Details Patient Name: Bradley Loa A. Date of Service: 11/04/2020 10:45 AM Medical Record Number: [id number] Patient Account Number: [id number] Date of Birth/Sex: [birth year] (75 y.o. M) Treating RN: Carlene Coria Primary Care Raven Furnas: [name] Other Clinician: Jeanine Luz Referring Darin Redmann: [name] Treating Remi Rester/Extender: Tito Dine in Treatment: 59 Wound Status Wound Number: 10 Primary Pressure Ulcer Etiology: Wound Location: Midline Sacrum Wound Open Wounding Event: Pressure Injury Status: Date Acquired: 06/07/2015 Comorbid Anemia,  Hypertension, History of pressure wounds, Weeks Of Treatment: 68 History: Rheumatoid Arthritis, Paraplegia Clustered Wound: No Photos Wound Measurements Length: (cm) 11.5 Width: (cm) 6 Depth: (cm) 4.5 Area: (cm) 54.192 Volume: (cm)  243.866 % Reduction in Area: -5.2% % Reduction in Volume: 21.1% Epithelialization: None Tunneling: No Undermining: No Wound Description Classification: Category/Stage IV Wound Margin: Epibole Exudate Amount: Large Exudate Type: Serosanguineous Exudate Color: red, brown Foul Odor After Cleansing: No Slough/Fibrino Yes Wound Bed Granulation Amount: Large (67-100%) Exposed Structure Granulation Quality: Red, Pink Fascia Exposed: No Necrotic Amount: Small (1-33%) Fat Layer (Subcutaneous Tissue) Exposed: Yes Necrotic Quality: Adherent Slough Tendon Exposed: No Muscle Exposed: No Joint Exposed: No Bone Exposed: Yes Treatment Notes Wound #10 (Sacrum) Wound Laterality: Midline Cleanser Vashe 5.8 (oz) Discharge Instruction: Use vashe 5.8 (oz) as directed Peri-Wound Care Bradley [name], Bradley [name] ([id number]) Topical Primary Dressing Gauze Discharge Instruction: Moistened with Vashe/Dakins and packed lightly into wound bed Secondary Dressing ABD Pad 5x9 (in/in) Discharge Instruction: Cover with ABD pad Xtrasorb Medium 4x5 (in/in) Discharge Instruction: Apply to wound as directed. Do not cut. Secured With 68M Medipore H Soft Cloth Surgical Tape, 2x2 (in/yd) Discharge Instruction: Secure dressing Compression Wrap Compression Stockings Add-Ons Electronic Signature(s) Signed: 11/05/2020 4:50:12 PM By: Carlene Coria RN Entered By: Carlene Coria on 11/04/2020 10:59:43 Bradley [name] ([id number]) -------------------------------------------------------------------------------- Wound Assessment Details Patient Name: Bradley Loa A. Date of Service: 11/04/2020 10:45 AM Medical Record Number: [id number] Patient Account Number: [id number] Date of Birth/Sex: 07/20/1945 (75 y.o. M) Treating RN: Carlene Coria Primary Care Siddalee Vanderheiden: [name] Other Clinician: Jeanine Luz Referring Pattricia Weiher: [name] Treating Kenyanna Grzesiak/Extender: Tito Dine in Treatment: 46 Wound  Status Wound Number: 12 Primary Pressure Ulcer Etiology: Wound Location: Left, Proximal Upper Leg Wound Open Wounding Event: Gradually Appeared Status: Date Acquired: 03/06/2020 Comorbid Anemia, Hypertension, History of pressure wounds, Weeks Of Treatment: 29 History: Rheumatoid Arthritis, Paraplegia Clustered Wound: No Photos Wound Measurements Length: (cm) 2.7 Width: (cm) 0.5 Depth: (cm) 0.3 Area: (cm) 1.06 Volume: (cm) 0.318 % Reduction in Area: 93.3% % Reduction in Volume: 79.8% Epithelialization: Small (1-33%) Tunneling: No Undermining: No Wound Description Classification: Category/Stage III Wound Margin: Flat and Intact Exudate Amount: Medium Exudate Type: Serosanguineous Exudate Color: red, brown Foul Odor After Cleansing: No Slough/Fibrino Yes Wound Bed Granulation Amount: Large (67-100%) Exposed Structure Granulation Quality: Red Fascia Exposed: No Necrotic Amount: Small (1-33%) Fat Layer (Subcutaneous Tissue) Exposed: Yes Necrotic Quality: Adherent Slough Tendon Exposed: No Muscle Exposed: No Joint Exposed: No Bone Exposed: No Electronic Signature(s) Signed: 11/04/2020 11:41:07 AM By: Georges Mouse, Minus Breeding RN Signed: 11/05/2020 4:50:12 PM By: Carlene Coria RN Entered By: Georges Mouse, Minus Breeding on 11/04/2020 11:41:07 Bradley [name] ([id number]) -------------------------------------------------------------------------------- Wound Assessment Details Patient Name: Bradley Loa A. Date of Service: 11/04/2020 10:45 AM Medical Record Number: [id number] Patient Account Number: [id number] Date of Birth/Sex: [birth year] (75 y.o. M) Treating RN: Carlene Coria Primary Care Erol Flanagin: [name] Other Clinician: Jeanine Luz Referring Librado Guandique: [name] Treating Darius Lundberg/Extender: Tito Dine in Treatment: 32 Wound Status Wound Number: 13 Primary Pressure Ulcer Etiology: Wound Location: Right, Posterior Upper Leg Wound Healed -  Epithelialized Wounding Event: Gradually Appeared Status: Date Acquired: 06/17/2020 Comorbid Anemia, Hypertension, History of pressure wounds, Weeks Of Treatment: 20 History: Rheumatoid Arthritis, Paraplegia Clustered Wound: No Photos Wound Measurements Length: (cm) 0 % R Width: (cm) 0 % R Depth: (cm) 0 Epi Area: (cm) 0 Tu Volume: (cm) 0 Un eduction in Area: 100% eduction in Volume: 100% thelialization: Large (67-100%) nneling: No dermining: No  Wound Description Classification: Category/Stage II Fou Exudate Amount: None Present Slo l Odor After Cleansing: No ugh/Fibrino No Wound Bed Granulation Amount: None Present (0%) Exposed Structure Necrotic Amount: None Present (0%) Fascia Exposed: No Fat Layer (Subcutaneous Tissue) Exposed: No Tendon Exposed: No Muscle Exposed: No Joint Exposed: No Bone Exposed: No Treatment Notes Wound #13 (Upper Leg) Wound Laterality: Right, Posterior Cleanser Peri-Wound Care Topical Primary Dressing Bradley [name], Bradley [name] ([id number]) Secondary Dressing Secured With Compression Wrap Compression Stockings Add-Ons Electronic Signature(s) Signed: 11/05/2020 4:50:12 PM By: Carlene Coria RN Entered By: Carlene Coria on 11/04/2020 11:01:07 Bradley [name] ([id number]) -------------------------------------------------------------------------------- [place] Details Patient Name: Bradley Loa A. Date of Service: 11/04/2020 10:45 AM Medical Record Number: [id number] Patient Account Number: [id number] Date of Birth/Sex: August 20, 1945 (75 y.o. M) Treating RN: Carlene Coria Primary Care Fionnuala Hemmerich: [name] Other Clinician: Jeanine Luz Referring Syaire Saber: [name] Treating Inocente Krach/Extender: Tito Dine in Treatment: 56 Vital Signs Time Taken: 10:47 Temperature (F): 98.3 Pulse (bpm): 69 Respiratory Rate (breaths/min): 18 Blood Pressure (mmHg): 116/73 Reference Range: 80 - 120 mg / dl Electronic Signature(s) Signed:  11/05/2020 4:50:12 PM By: Carlene Coria RN Entered By: Carlene Coria on 11/04/2020 10:47:46

## 2020-11-10 DIAGNOSIS — G8220 Paraplegia, unspecified: Secondary | ICD-10-CM | POA: Diagnosis not present

## 2020-11-10 DIAGNOSIS — L89154 Pressure ulcer of sacral region, stage 4: Secondary | ICD-10-CM | POA: Diagnosis not present

## 2020-11-10 DIAGNOSIS — M869 Osteomyelitis, unspecified: Secondary | ICD-10-CM | POA: Diagnosis not present

## 2020-11-11 ENCOUNTER — Other Ambulatory Visit: Payer: Self-pay

## 2020-11-11 ENCOUNTER — Inpatient Hospital Stay: Payer: Medicare HMO | Attending: Oncology

## 2020-11-11 DIAGNOSIS — D509 Iron deficiency anemia, unspecified: Secondary | ICD-10-CM | POA: Insufficient documentation

## 2020-11-11 DIAGNOSIS — D508 Other iron deficiency anemias: Secondary | ICD-10-CM

## 2020-11-11 LAB — CBC WITH DIFFERENTIAL/PLATELET
Abs Immature Granulocytes: 0.02 10*3/uL (ref 0.00–0.07)
Basophils Absolute: 0 10*3/uL (ref 0.0–0.1)
Basophils Relative: 0 %
Eosinophils Absolute: 0.3 10*3/uL (ref 0.0–0.5)
Eosinophils Relative: 3 %
HCT: 35.3 % — ABNORMAL LOW (ref 39.0–52.0)
Hemoglobin: 11 g/dL — ABNORMAL LOW (ref 13.0–17.0)
Immature Granulocytes: 0 %
Lymphocytes Relative: 31 %
Lymphs Abs: 2.7 10*3/uL (ref 0.7–4.0)
MCH: 23.3 pg — ABNORMAL LOW (ref 26.0–34.0)
MCHC: 31.2 g/dL (ref 30.0–36.0)
MCV: 74.6 fL — ABNORMAL LOW (ref 80.0–100.0)
Monocytes Absolute: 0.7 10*3/uL (ref 0.1–1.0)
Monocytes Relative: 8 %
Neutro Abs: 4.9 10*3/uL (ref 1.7–7.7)
Neutrophils Relative %: 58 %
Platelets: 295 10*3/uL (ref 150–400)
RBC: 4.73 MIL/uL (ref 4.22–5.81)
RDW: 16.9 % — ABNORMAL HIGH (ref 11.5–15.5)
WBC: 8.7 10*3/uL (ref 4.0–10.5)
nRBC: 0 % (ref 0.0–0.2)

## 2020-11-11 LAB — IRON AND TIBC
Iron: 29 ug/dL — ABNORMAL LOW (ref 45–182)
Saturation Ratios: 12 % — ABNORMAL LOW (ref 17.9–39.5)
TIBC: 248 ug/dL — ABNORMAL LOW (ref 250–450)
UIBC: 219 ug/dL

## 2020-11-11 LAB — FERRITIN: Ferritin: 138 ng/mL (ref 24–336)

## 2020-11-13 ENCOUNTER — Ambulatory Visit: Payer: Medicare HMO

## 2020-11-13 ENCOUNTER — Ambulatory Visit: Payer: Medicare HMO | Admitting: Oncology

## 2020-11-18 ENCOUNTER — Encounter: Payer: Self-pay | Admitting: Oncology

## 2020-11-18 ENCOUNTER — Inpatient Hospital Stay (HOSPITAL_BASED_OUTPATIENT_CLINIC_OR_DEPARTMENT_OTHER): Payer: Medicare HMO | Admitting: Oncology

## 2020-11-18 ENCOUNTER — Other Ambulatory Visit: Payer: Self-pay

## 2020-11-18 ENCOUNTER — Inpatient Hospital Stay: Payer: Medicare HMO

## 2020-11-18 VITALS — BP 126/64 | HR 69 | Temp 97.4°F | Ht 73.0 in | Wt 209.0 lb

## 2020-11-18 VITALS — BP 139/61 | HR 57

## 2020-11-18 DIAGNOSIS — D638 Anemia in other chronic diseases classified elsewhere: Secondary | ICD-10-CM

## 2020-11-18 DIAGNOSIS — D508 Other iron deficiency anemias: Secondary | ICD-10-CM

## 2020-11-18 DIAGNOSIS — D509 Iron deficiency anemia, unspecified: Secondary | ICD-10-CM | POA: Diagnosis not present

## 2020-11-18 MED ORDER — SODIUM CHLORIDE 0.9 % IV SOLN
Freq: Once | INTRAVENOUS | Status: AC
Start: 2020-11-18 — End: 2020-11-18
  Filled 2020-11-18: qty 250

## 2020-11-18 MED ORDER — SODIUM CHLORIDE 0.9 % IV SOLN: 200.0000 mg | Freq: Once | INTRAVENOUS | Status: DC

## 2020-11-18 MED ORDER — IRON SUCROSE 20 MG/ML IV SOLN
200.0000 mg | Freq: Once | INTRAVENOUS | Status: AC
  Administered 2020-11-18: 200 mg via INTRAVENOUS
  Filled 2020-11-18: qty 10

## 2020-11-18 NOTE — Patient Instructions (Signed)
[facility]  Discharge Instructions: Thank you for choosing [facility] to provide your oncology and hematology care.  If you have a lab appointment with the Cancer Center, please go directly to the Cancer Center and check in at the registration area.  Wear comfortable clothing and clothing appropriate for easy access to any Portacath or PICC line.   We strive to give you quality time with your provider. You may need to reschedule your appointment if you arrive late (15 or more minutes).  Arriving late affects you and other patients whose appointments are after yours.  Also, if you miss three or more appointments without notifying the office, you may be dismissed from the clinic at the provider's discretion.      For prescription refill requests, have your pharmacy contact our office and allow 72 hours for refills to be completed.    Today you received venofer   To help prevent nausea and vomiting after your treatment, we encourage you to take your nausea medication as directed.  BELOW ARE SYMPTOMS THAT SHOULD BE REPORTED IMMEDIATELY: *FEVER GREATER THAN 100.4 F (38 C) OR HIGHER *CHILLS OR SWEATING *NAUSEA AND VOMITING THAT IS NOT CONTROLLED WITH YOUR NAUSEA MEDICATION *UNUSUAL SHORTNESS OF BREATH *UNUSUAL BRUISING OR BLEEDING *URINARY PROBLEMS (pain or burning when urinating, or frequent urination) *BOWEL PROBLEMS (unusual diarrhea, constipation, pain near the anus) TENDERNESS IN MOUTH AND THROAT WITH OR WITHOUT PRESENCE OF ULCERS (sore throat, sores in mouth, or a toothache) UNUSUAL RASH, SWELLING OR PAIN  UNUSUAL VAGINAL DISCHARGE OR ITCHING   Items with * indicate a potential emergency and should be followed up as soon as possible or go to the Emergency Department if any problems should occur.  Please show the CHEMOTHERAPY ALERT CARD or IMMUNOTHERAPY ALERT CARD at check-in to the Emergency Department and triage nurse.  Should you  have questions after your visit or need to cancel or reschedule your appointment, please contact [facility]  [phone] and follow the prompts.  Office hours are 8:00 a.m. to 4:30 p.m. Monday - Friday. Please note that voicemails left after 4:00 p.m. may not be returned until the following business day.  We are closed weekends and major holidays. You have access to a nurse at all times for urgent questions. Please call the main number to the clinic [phone] and follow the prompts.  For any non-urgent questions, you may also contact your provider using MyChart. We now offer e-Visits for anyone 18 and older to request care online for non-urgent symptoms. For details visit [email or web address].   Also download the MyChart app! Go to the app store, search "MyChart", open the app, select [facility], and log in with your MyChart username and password.  Due to Covid, a mask is required upon entering the hospital/clinic. If you do not have a mask, one will be given to you upon arrival. For doctor visits, patients may have 1 support person aged 18 or older with them. For treatment visits, patients cannot have anyone with them due to current Covid guidelines and our immunocompromised population.  

## 2020-11-18 NOTE — Progress Notes (Signed)
Hematology/Oncology Follow Up Note [facility] [phone] [phone]  CONSULT NOTE Patient Care Team: [name], MD as PCP - General (Family Medicine)  CHIEF COMPLAINTS/PURPOSE OF CONSULTATION:   anemia   HISTORY OF PRESENTING ILLNESS:  Bradley [name] 75 y.o.  male with PMH listed as below who was referred to me for evaluation of iron deficiency anemia.  Patient is paraplegic, also has chronic pressure ulcer, recently just had colostomy. He self catheterizes. Patient feels that his energy levels have been the same. He always has mild fatigue.  He has BPH for which he take finasteride. He has not see his urologist for a while.  He was seen by [facility] for elevated ESR and his chronic hand joint tenderness. He was not considered to have rheumatoid arthritis.   INTERVAL HISTORY Patient presents to follow-up for anemia management.   Patient has received IV Venofer treatments.  Today he reports improvement of fatigue level.  He voices no new complaints.  ROS:  Review of Systems  Constitutional:  Negative for fatigue.  HENT:  Negative.  Negative for lump/mass and mouth sores.   Eyes: Negative.  Negative for eye problems.  Respiratory: Negative.  Negative for chest tightness and cough.   Cardiovascular: Negative.  Negative for chest pain.  Gastrointestinal: Negative.  Negative for abdominal distention and abdominal pain.       Colostomy  Endocrine: Negative.  Negative for hot flashes.  Genitourinary:  Negative for hematuria.        Self catherize  Musculoskeletal:  Positive for arthralgias. Negative for back pain.       Paraplegic  Skin:  Positive for wound. Negative for itching and rash.       Chronic pressure ulcer  Neurological: Negative.  Negative for dizziness.  Hematological: Negative.  Negative for adenopathy.  Psychiatric/Behavioral: Negative.  The patient is not nervous/anxious.    MEDICAL HISTORY:  Past Medical History:   Diagnosis Date   Anemia    Arthritis    Paralysis of both lower limbs ([place])    Sacral decubitus ulcer     SURGICAL HISTORY: Past Surgical History:  Procedure Laterality Date   COLONOSCOPY  06/06/2014   INCISION AND DRAINAGE OF WOUND N/A 06/04/2018   Procedure: IRRIGATION AND DEBRIDEMENT WOUND;  Surgeon: Olean Ree, MD;  Location: [facility] ORS;  Service: General;  Laterality: N/A;   TONSILLECTOMY      SOCIAL HISTORY: Social History   Socioeconomic History   Marital status: Married    Spouse name: Not on file   Number of children: Not on file   Years of education: Not on file   Highest education level: Not on file  Occupational History   Not on file  Tobacco Use   Smoking status: Former    Packs/day: 0.50    Years: 5.00    Pack years: 2.50    Types: Cigarettes    Quit date: 31    Years since quitting: 50.4   Smokeless tobacco: Never  Vaping Use   Vaping Use: Never used  Substance and Sexual Activity   Alcohol use: No   Drug use: No   Sexual activity: Not on file  Other Topics Concern   Not on file  Social History Narrative   Not on file   Social Determinants of Health   Financial Resource Strain: Not on file  Food Insecurity: Not on file  Transportation Needs: Not on file  Physical Activity: Not on file  Stress: Not on file  Social Connections: Not on file  Intimate Partner Violence: Not on file    FAMILY HISTORY: Family History  Problem Relation Age of Onset   Breast cancer Mother     ALLERGIES:  has No Known Allergies.  MEDICATIONS:  Current Outpatient Medications  Medication Sig Dispense Refill   acetaminophen (TYLENOL) 500 MG tablet Take 1,000 mg by mouth 3 (three) times daily.      amLODipine (NORVASC) 5 MG tablet Take 5 mg by mouth daily.     baclofen (LIORESAL) 10 MG tablet Take 20 mg by mouth 3 (three) times daily.     finasteride (PROSCAR) 5 MG tablet Take 5 mg by mouth daily.     Multiple Vitamin (MULTI-VITAMINS) TABS Take 1 tablet  by mouth daily.      nystatin cream (MYCOSTATIN) APPLY TO AFFECTED AREA TWICE A DAY 30 g 0   polyethylene glycol powder (GLYCOLAX/MIRALAX) powder Take 17 g by mouth daily as needed for mild constipation.      tetracycline (SUMYCIN) 500 MG capsule Take 1 capsule (500 mg total) by mouth 2 (two) times daily. 14 capsule 0   vitamin C (ASCORBIC ACID) 500 MG tablet Take 500 mg by mouth 2 (two) times daily.     Testosterone 20.25 MG/1.25GM (1.62%) GEL Place 2 Pump onto the skin daily. (Patient not taking: Reported on 11/18/2020) 150 g 3   No current facility-administered medications for this visit.      .  PHYSICAL EXAMINATION: ECOG PERFORMANCE STATUS: 2 - Symptomatic, <50% confined to bed Vitals:   11/18/20 1407  BP: 126/64  Pulse: 69  Temp: (!) 97.4 F (36.3 C)  SpO2: 100%   Filed Weights   11/18/20 1407  Weight: 209 lb (94.8 kg)   Physical Exam Constitutional:      General: He is not in acute distress.    Appearance: He is not diaphoretic.  HENT:     Head: Normocephalic and atraumatic.     Nose: Nose normal.     Mouth/Throat:     Pharynx: No oropharyngeal exudate.  Eyes:     General: No scleral icterus.    Pupils: Pupils are equal, round, and reactive to light.  Cardiovascular:     Rate and Rhythm: Normal rate and regular rhythm.     Heart sounds: No murmur heard. Pulmonary:     Effort: Pulmonary effort is normal. No respiratory distress.     Breath sounds: No rales.  Chest:     Chest wall: No tenderness.  Abdominal:     General: There is no distension.     Palpations: Abdomen is soft.     Tenderness: There is no abdominal tenderness.  Musculoskeletal:     Cervical back: Normal range of motion and neck supple.     Comments: Bilateral ulnar deviation of metacarpophalangeal joints and joint tenderness.    Skin:    General: Skin is warm and dry.     Findings: No erythema.  Neurological:     Mental Status: He is alert and oriented to person, place, and time.      Cranial Nerves: No cranial nerve deficit.     Motor: No abnormal muscle tone.     Coordination: Coordination normal.     Comments:  paraplegic  Psychiatric:        Mood and Affect: Affect normal.      LABORATORY DATA:  I have reviewed the data as listed Lab Results  Component Value Date   WBC 8.7 11/11/2020     HGB 11.0 (L) 11/11/2020   HCT 35.3 (L) 11/11/2020   MCV 74.6 (L) 11/11/2020   PLT 295 11/11/2020   Recent Labs    04/01/20 1335 05/08/20 1103  NA 139 137  K 3.3* 4.0  CL 100 101  CO2 29 31  GLUCOSE 103* 81  BUN 6* 10  CREATININE 0.45* 0.46*  CALCIUM 7.9* 8.6*  GFRNONAA >60 >60  PROT 7.2 8.3*  ALBUMIN 2.3* 2.8*  AST 14* 15  ALT 7 9  ALKPHOS 48 60  BILITOT 0.6 0.5    Iron/TIBC/Ferritin/ %Sat    Component Value Date/Time   IRON 29 (L) 11/11/2020 1328   TIBC 248 (L) 11/11/2020 1328   FERRITIN 138 11/11/2020 1328   IRONPCTSAT 12 (L) 11/11/2020 1328   Negative stool occult.   ASSESSMENT & PLAN:  1. Other iron deficiency anemia   2. Anemia in other chronic diseases classified elsewhere    #Anemia, Multifactorial, likely secondary to iron deficiency anemia and chronic inflammation. Labs are reviewed and discussed with patient Hemoglobin has improved to 11, iron saturation 12, ferritin 138. He responded to IV Venofer treatments very well. I recommend patient to proceed with IV Venofer today and repeat another dose in 2 weeks to further improve iron stores.  # Thrombocytosis likely due to iron deficiency.    This has resolved all questions were answered. The patient knows to call the clinic with any problems questions or concerns.  Return of visit: 6 months.  Earlie Server, MD, PhD Hematology Oncology [facility] Pager- [phone] 11/18/2020
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
## 2020-11-23 IMAGING — CT CT ABD-PELV W/ CM
2 of 5 series · 15 of 46 positions shown, 17 images · IV contrast (APPLIED)
Comparison: June 27, 2013

CLINICAL DATA: Sacral decubitus ulcer, fever

EXAM:
CT ABDOMEN AND PELVIS WITH CONTRAST
TECHNIQUE: Multidetector CT imaging of the abdomen and pelvis was performed
using the standard protocol following bolus administration of
intravenous contrast.
CONTRAST:  100mL OMNIPAQUE IOHEXOL 300 MG/ML  SOLN

[Series 2: axial st · axial · 0.81mm/px · z∈[-788,-342]mm · 12 of 101 slices shown, 14 images]
[im 6/101  soft-tissue]
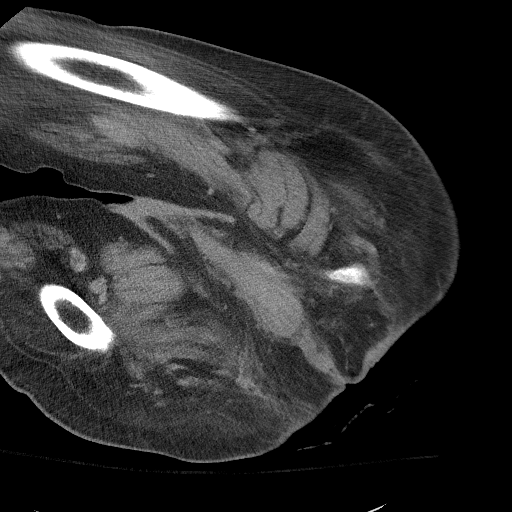
[im 6/101  bone]
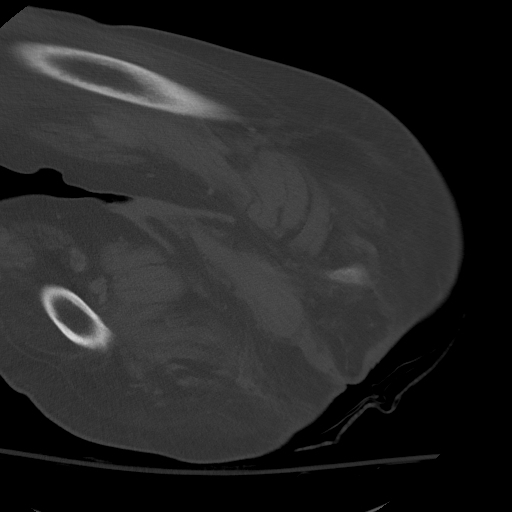
[im 17/101  soft-tissue]
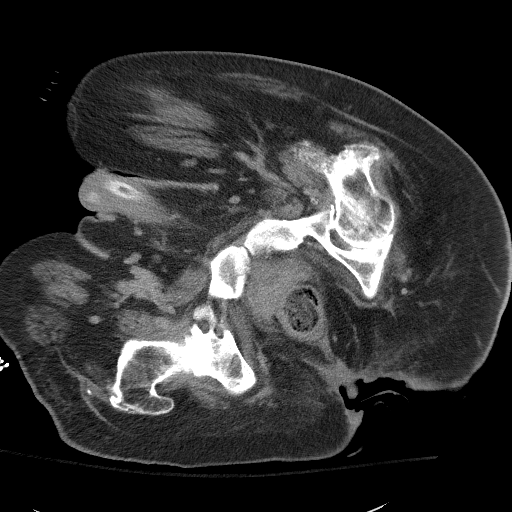
[im 23/101  soft-tissue]
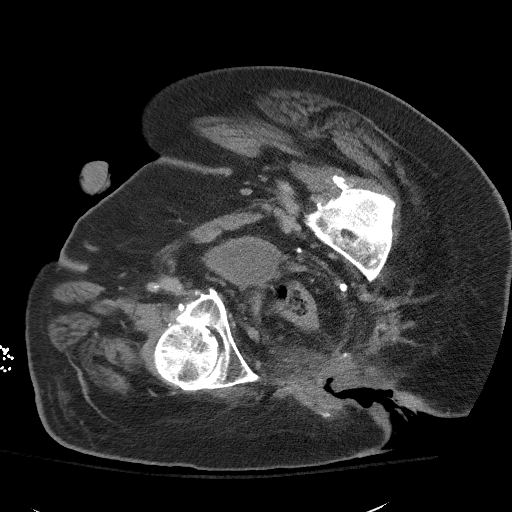
[im 28/101  soft-tissue]
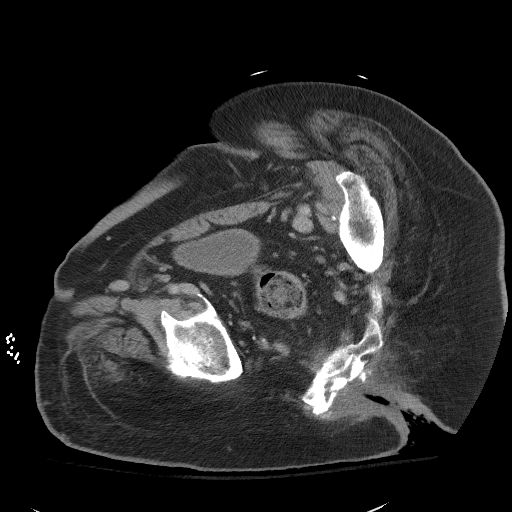
[im 39/101  soft-tissue]
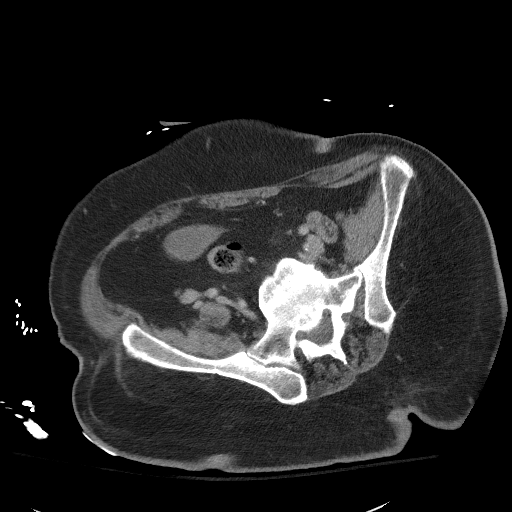
[im 45/101  soft-tissue]
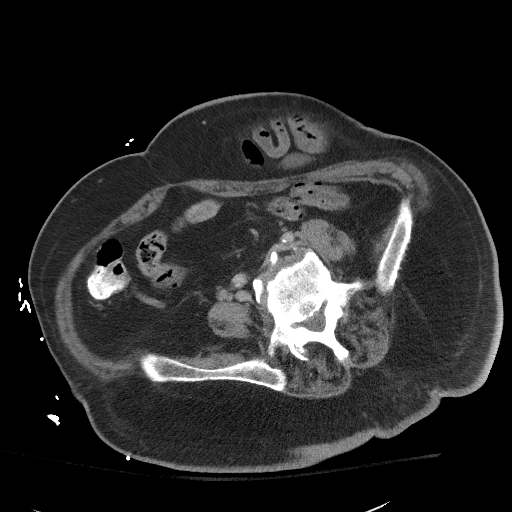
[im 56/101  soft-tissue]
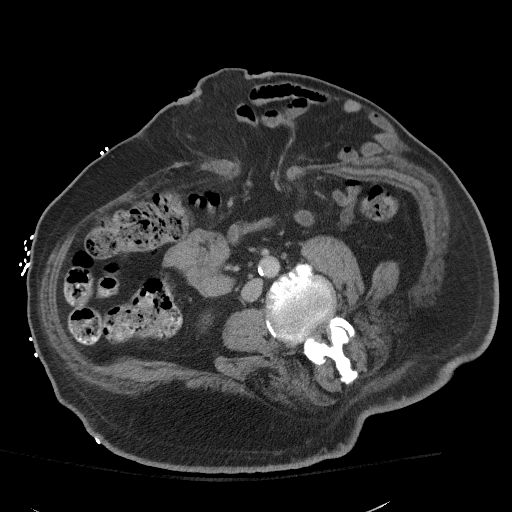
[im 62/101  soft-tissue]
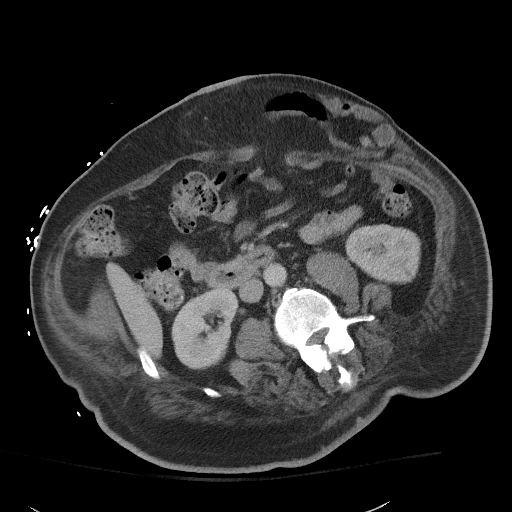
[im 73/101  soft-tissue]
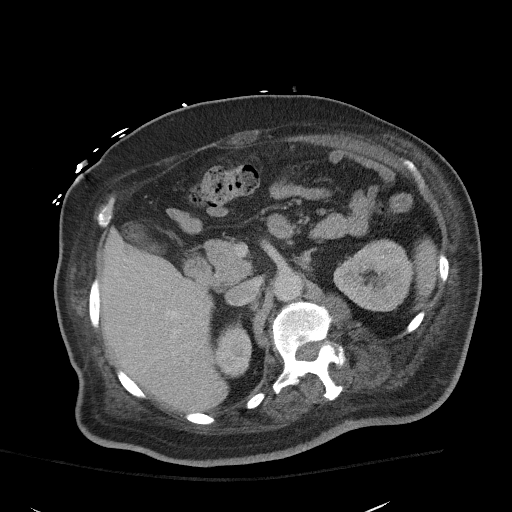
[im 73/101  bone]
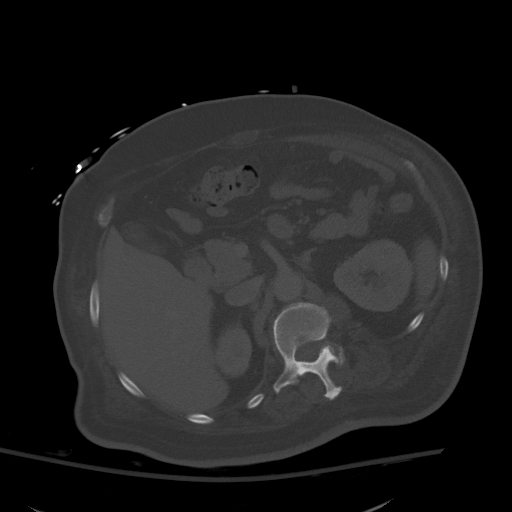
[im 78/101  soft-tissue]
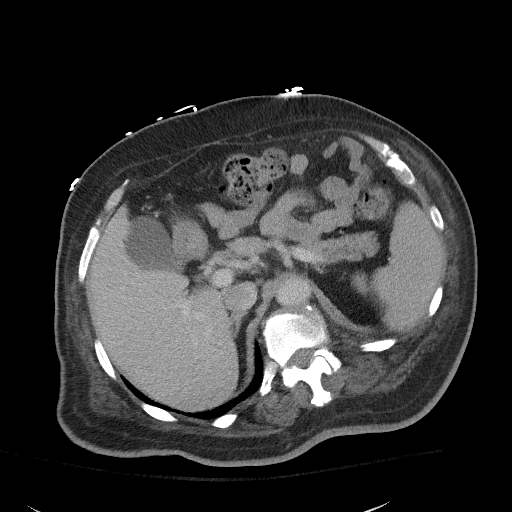
[im 84/101  soft-tissue]
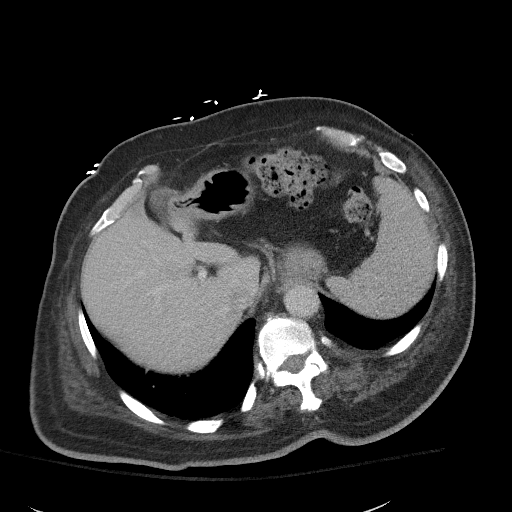
[im 95/101  soft-tissue]
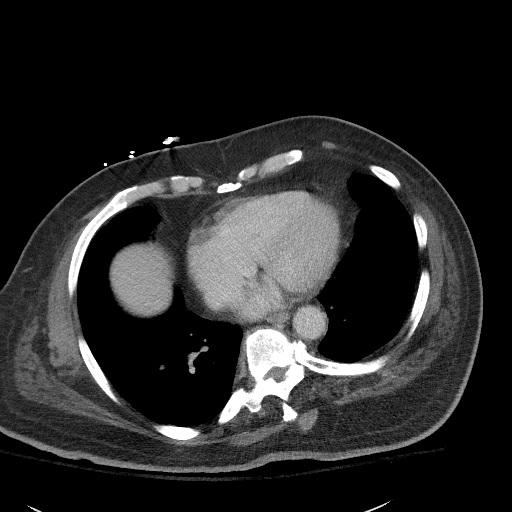

[Series 5: coronal st · coronal · 0.73mm/px · 3 of 137 slices shown]
[im 46/137  soft-tissue]
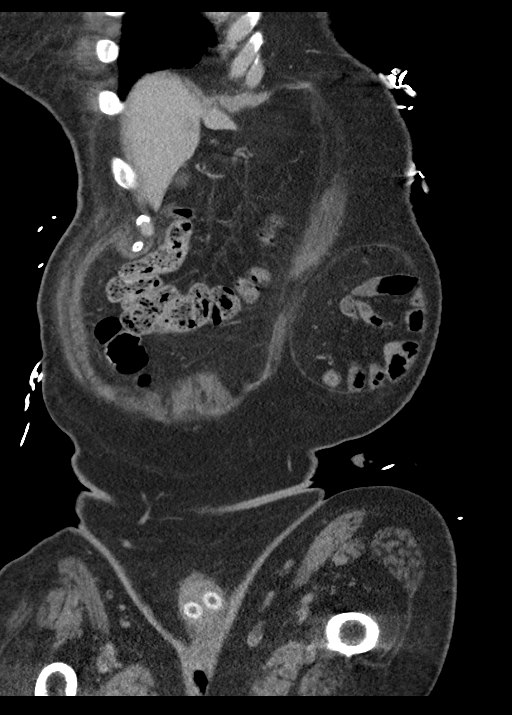
[im 61/137  soft-tissue]
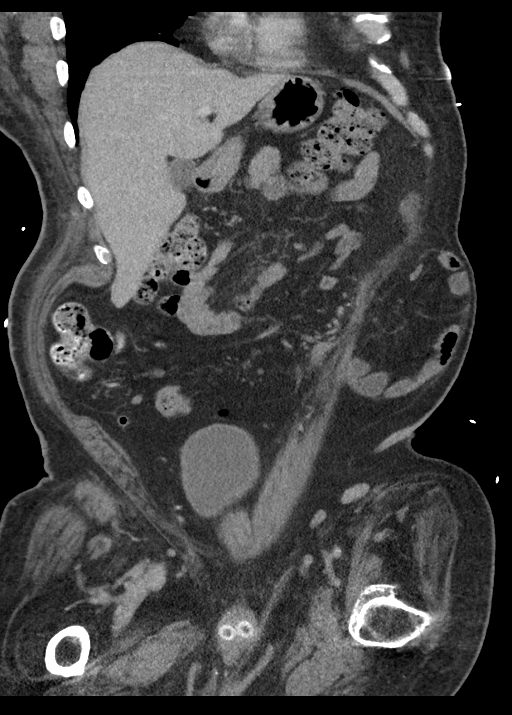
[im 76/137  soft-tissue]
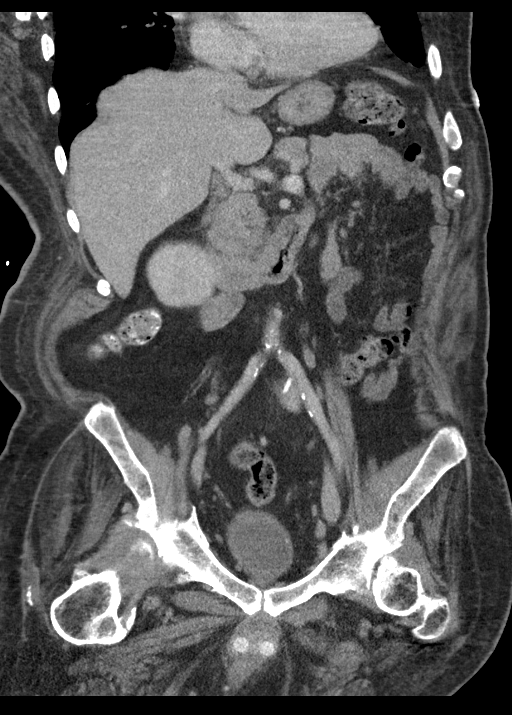

[15 of 46 positions shown; findings below may reference images not displayed]

FINDINGS: Lower chest: The visualized heart size within normal limits. No
pericardial fluid/thickening.

No hiatal hernia.

The visualized portions of the lungs are clear.

Hepatobiliary: The liver is normal in density without focal
abnormality.The main portal vein is patent. No evidence of calcified
gallstones, gallbladder wall thickening or biliary dilatation.

Pancreas: Unremarkable. No pancreatic ductal dilatation or
surrounding inflammatory changes.

Spleen: Normal in size without focal abnormality.

Adrenals/Urinary Tract: Both adrenal glands appear normal. The
kidneys and collecting system appear normal without evidence of
urinary tract calculus or hydronephrosis. Bladder is unremarkable.

Stomach/Bowel: The stomach, small bowel, and colon are normal in
appearance. Along the left lower abdominal wall there is a ostomy
site with a large parastomal hernia containing mesentery, small
bowel and colon. No bowel strangulation is seen. No inflammatory
changes, wall thickening, or obstructive findings. Scattered colonic
diverticula are noted without diverticulitis. The appendix is
normal.

Vascular/Lymphatic: There are no enlarged mesenteric,
retroperitoneal, or pelvic lymph nodes. Scattered aortic
atherosclerotic calcifications are seen without aneurysmal
dilatation.

Reproductive: The prostate is unremarkable.

Other: No focal fluid collection or soft tissue mass

Musculoskeletal: Overlying the inferior sacrum there is large sacral
decubitus ulcer extending 8 cm to the overlying coccyx. The
ulceration extends to the cortical surface of the inferior sacrum,
likely the S5 segment. There is a probable prior coccyx resection.
There is fragmentation and destruction noted within the inferior
sacrum. Heterogeneous fluid seen within the region of the
ulceration. No loculated fluid collection is noted. Degenerative
changes are seen in the lower lumbar spine with large anterior
osteophytes. There is advanced hip osteoarthritis.
IMPRESSION: Large sacral decubitus ulceration extending 8 cm to the underlying
inferior sacrum where there is findings of osteomyelitis involving
the inferior sacrum. Probable phlegmon and inflammatory changes
surrounding the area of ulceration. No definite abscess.

## 2020-11-29 DIAGNOSIS — G8220 Paraplegia, unspecified: Secondary | ICD-10-CM | POA: Diagnosis not present

## 2020-11-29 DIAGNOSIS — L89154 Pressure ulcer of sacral region, stage 4: Secondary | ICD-10-CM | POA: Diagnosis not present

## 2020-11-29 DIAGNOSIS — M869 Osteomyelitis, unspecified: Secondary | ICD-10-CM | POA: Diagnosis not present

## 2020-12-02 ENCOUNTER — Inpatient Hospital Stay: Payer: Medicare HMO

## 2020-12-08 DIAGNOSIS — S81809A Unspecified open wound, unspecified lower leg, initial encounter: Secondary | ICD-10-CM | POA: Diagnosis not present

## 2020-12-08 DIAGNOSIS — S31000A Unspecified open wound of lower back and pelvis without penetration into retroperitoneum, initial encounter: Secondary | ICD-10-CM | POA: Diagnosis not present

## 2020-12-10 DIAGNOSIS — M869 Osteomyelitis, unspecified: Secondary | ICD-10-CM | POA: Diagnosis not present

## 2020-12-10 DIAGNOSIS — G8220 Paraplegia, unspecified: Secondary | ICD-10-CM | POA: Diagnosis not present

## 2020-12-10 DIAGNOSIS — L89154 Pressure ulcer of sacral region, stage 4: Secondary | ICD-10-CM | POA: Diagnosis not present

## 2020-12-21 DIAGNOSIS — I10 Essential (primary) hypertension: Secondary | ICD-10-CM | POA: Diagnosis not present

## 2020-12-21 DIAGNOSIS — M62461 Contracture of muscle, right lower leg: Secondary | ICD-10-CM | POA: Diagnosis not present

## 2020-12-21 DIAGNOSIS — D649 Anemia, unspecified: Secondary | ICD-10-CM | POA: Diagnosis not present

## 2020-12-21 DIAGNOSIS — Z136 Encounter for screening for cardiovascular disorders: Secondary | ICD-10-CM | POA: Diagnosis not present

## 2020-12-21 DIAGNOSIS — Z0000 Encounter for general adult medical examination without abnormal findings: Secondary | ICD-10-CM | POA: Diagnosis not present

## 2020-12-21 DIAGNOSIS — M62462 Contracture of muscle, left lower leg: Secondary | ICD-10-CM | POA: Diagnosis not present

## 2020-12-24 DIAGNOSIS — N319 Neuromuscular dysfunction of bladder, unspecified: Secondary | ICD-10-CM | POA: Diagnosis not present

## 2020-12-24 DIAGNOSIS — K631 Perforation of intestine (nontraumatic): Secondary | ICD-10-CM | POA: Diagnosis not present

## 2020-12-24 DIAGNOSIS — S81809A Unspecified open wound, unspecified lower leg, initial encounter: Secondary | ICD-10-CM | POA: Diagnosis not present

## 2020-12-24 DIAGNOSIS — S31000A Unspecified open wound of lower back and pelvis without penetration into retroperitoneum, initial encounter: Secondary | ICD-10-CM | POA: Diagnosis not present

## 2020-12-24 DIAGNOSIS — Z933 Colostomy status: Secondary | ICD-10-CM | POA: Diagnosis not present

## 2020-12-29 DIAGNOSIS — G8220 Paraplegia, unspecified: Secondary | ICD-10-CM | POA: Diagnosis not present

## 2020-12-29 DIAGNOSIS — M869 Osteomyelitis, unspecified: Secondary | ICD-10-CM | POA: Diagnosis not present

## 2020-12-29 DIAGNOSIS — L89154 Pressure ulcer of sacral region, stage 4: Secondary | ICD-10-CM | POA: Diagnosis not present

## 2021-01-06 ENCOUNTER — Other Ambulatory Visit: Payer: Self-pay

## 2021-01-06 ENCOUNTER — Encounter: Payer: Medicare HMO | Attending: Internal Medicine | Admitting: Internal Medicine

## 2021-01-06 DIAGNOSIS — L89154 Pressure ulcer of sacral region, stage 4: Secondary | ICD-10-CM | POA: Diagnosis not present

## 2021-01-06 DIAGNOSIS — L97128 Non-pressure chronic ulcer of left thigh with other specified severity: Secondary | ICD-10-CM | POA: Diagnosis not present

## 2021-01-06 DIAGNOSIS — G8220 Paraplegia, unspecified: Secondary | ICD-10-CM | POA: Diagnosis not present

## 2021-01-06 DIAGNOSIS — Z87891 Personal history of nicotine dependence: Secondary | ICD-10-CM | POA: Insufficient documentation

## 2021-01-06 DIAGNOSIS — W3400XS Accidental discharge from unspecified firearms or gun, sequela: Secondary | ICD-10-CM | POA: Diagnosis not present

## 2021-01-06 DIAGNOSIS — Z880 Allergy status to penicillin: Secondary | ICD-10-CM | POA: Insufficient documentation

## 2021-01-06 DIAGNOSIS — M8668 Other chronic osteomyelitis, other site: Secondary | ICD-10-CM | POA: Diagnosis not present

## 2021-01-07 DIAGNOSIS — Z933 Colostomy status: Secondary | ICD-10-CM | POA: Diagnosis not present

## 2021-01-07 DIAGNOSIS — S31000A Unspecified open wound of lower back and pelvis without penetration into retroperitoneum, initial encounter: Secondary | ICD-10-CM | POA: Diagnosis not present

## 2021-01-07 DIAGNOSIS — S81809A Unspecified open wound, unspecified lower leg, initial encounter: Secondary | ICD-10-CM | POA: Diagnosis not present

## 2021-01-07 DIAGNOSIS — N319 Neuromuscular dysfunction of bladder, unspecified: Secondary | ICD-10-CM | POA: Diagnosis not present

## 2021-01-07 DIAGNOSIS — K631 Perforation of intestine (nontraumatic): Secondary | ICD-10-CM | POA: Diagnosis not present

## 2021-01-07 NOTE — Progress Notes (Signed)
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 undergo additional plastic surgery secondary to the wounds close proximity to the anus. Apparently the patient was offered a diverting colostomy at one point. In any case he is only been using wet to dry dressings surprisingly changing this himself at home using a mirror. He does not have home health. He does have a level II pressure-relief surface as well as a  Roho cushion for his wheelchair. In spite of this the wound is considerably larger one than when he was last in the clinic currently measuring 12.5 x 7. There is also an area superiorly in the wound that tunnels more deeply. Clearly a stage III wound 03/15/16 patient presents today for reevaluation concerning his midline sacral pressure ulcer. This again is an extensive ulcer which does not extend to bone fortunately but is sufficiently large to make healing of this wound difficult. Again he has been seen at [facility] where apparently they did discuss with him the possibility of a diverting colostomy but he did not want any part of that. Subsequently he has not followed up there currently. He continues overall to do fairly well all things considered with this wound. He is currently utilizing Medihoney Santyl would be extremely expensive for the amount he would need and likely cost prohibitive. 03/29/16; we'll follow this patient on an every two-week basis. He has a fairly substantial stage 3 pressure ulcer over his lower sacrum and coccyx and extending into his bilateral gluteal areas left greater than right. He now has home health[facility] I think  [facility]. He is applying Medihoney, kerlix and border foam. He arrives today with the intake nurse reporting a large amount of drainage. The patient stated he put his dressing on it 7:00 this morning by the time he arrived here at 10 there was already a moderate to a large amount of drainage. I once again reviewed his history. He had an attempted closure with myocutaneous flap earlier this year at [facility]. This did not go well. He was offered a diverting colostomy but refused. He is not a candidate for a wound VAC as the actual wound is precariously close to his anal opening. As mentioned he does have [facility] but miraculously this patient who is a paraplegic is actually changing the dressings himself. 04/12/16 patient presents today for a follow-up of his  essentially large sacral pressure ulcer stage III. Nothing has changed dramatically since I last saw him about one month ago. He has seen Dr. Dellia Nims once the interim. With that being said patient's wound appears somewhat less macerated today compared to previous evaluations. He still has no pain being a quadriplegic. 04-26-16 Mr. Macvicar returns today for a violation of his stage III sacral pressure ulcer he denies any complaints concerns or issues over the past 2 weeks. He missed to changing dressing twice daily due to drainage although he states this is not an increase in drainage over the past 2 weeks. He does change his dressings independently. He admits to sitting in his motorized chair for no more than 2-3 hours at which time he transfers to bed and rotates lateral position. 05/10/16; Cartier Llanes returns today for review of his stage III sacral pressure ulcer. He denies any concerns over the last 2 weeks although he seems to be running out of Aquacel Ag and on those days he uses Medihoney. He has [facility] was supplying his dressings. He still complains of drainage. He does his dressings independently. He has in his motorized chair for 2-3 hours that time other than that he offloads this. Dimensions of the wound are down 1 cm in both directions. He underwent an aggressive debridement on his last visit of thick circumferential skin and subcutaneous tissue. It is possible at some point in the future he is going to need this done again 05/24/16; the patient returns today for review of his stage III sacral pressure ulcer. We have been using Aquacel Ag he tells me that he changes this up to twice a day. I'm not really certain of the reason for this frequency of changing. He has some involvement from the home health nurses but I think is doing most of the changing himself which I think because of his paraplegia would be a very difficult exercise. Nevertheless he states that there is  "wetness". I am not sure if there is another dressing that we could easily changed that much. I'd wanted to change to [facility] but I'll need to have a sense of how frequent he would need to change this. DEYON, WESSON ([id number]) 06/14/16; this is a patient returns for review of his stage III sacral pressure ulcer. We have been using Aquacel Ag and over the last 2 visits he has had extensive debridement so of the thick circumferential skin and subcutaneous tissue that surrounds this wound. In spite of this really absolutely no change in the condition of the wound warrants measurements. We have [facility] home health I believe changing the dressing on 3 occasions the patient states he does this  on one occasion himself 06/28/16; this is a patient who has a fairly large stage III sacral pressure ulcer. I changed him to [facility] from Aquacel 2 weeks ago. He returns today in follow-up. In the meantime a nurse from [facility] has calledrequesting ordering of a wound VAC. He had this discussion before. The problem is the proximity of the lowest edge of this wound to the patient's anal opening roughly 3/4 of an inch. Can't see how this can be arranged. Apparently the nurse who is calling has a lot of experience, the question would be then when she is not available would be doing this. I would not have thought that this wound is not amenable to a wound VAC because of this reason 07/12/16; the patient comes in today and I have signed orders for a wound VAC. The home health team through [facility] is convinced that he can benefit from this even though there is close proximity to his anal opening beneath the gluteal clefts. The patient does not have a bowel regimen but states he has a bowel movement every 2 days this will also provide some problem with regards to the vac seal 07/26/16; the patient never did obtain a [place] wound VAC as he could not afford the $200 per month co-pay we have been using  Hydrofera Blue now for 6 weeks or so. No major change in this wound at all. He is still not interested in the concept of plastic surgery. There changing the dressing every second day 08/09/16; the patient arrives with a wound precisely in the same situation. In keeping with the plan I outlined last time extensive debridement with an open curet the surface of this is not completely viable. Still has some degree of surrounding thick skin and subcutaneous tissue. No evidence of infection. Once again I have had a conversation with him about plastic surgery, he is simply not interested. 08/23/16; wound is really no different. Thick circumferential skin and subcutaneous tissue around the wound edge which is a lot better from debridement we did earlier in the year. The surface of the wound looks viable however with a curet there is definitely a gritty surface to this. We use Medihoney for a while, he could not afford Santyl. I don't think we could get a supply of Iodoflex. He talks a little more positively about the concept of plastic surgery which I've gone over with him today 08/31/16;; patient arrives in clinic today with the wound surface really no different there is no changes in dimensions. I debrided today surface on the left upper side of this wound aggressively week ago there is no real change here no evidence of epithelialization. The problem with debridement in the clinic is that he believes from this very liberally. We have been using Sorbact. 09/21/16; absolutely no change in the appearance or measurements of this wound. More recently I've been debrided in this aggressively and using sorbact to see if we could get to a better wound surface. Although this visually looks satisfactory, debridement reveals a very gritty surface to this. However even with this debridement and removal of thick nonviable skin and subcutaneous tissue from around the large amount of the circumference of this wound we have  made absolutely no progress. This may be an offloading issue I'm just not completely certain. It has 2 close proximity in its inferior aspect to consider negative pressure therapy 10/26/16; READMISSION This patient called our clinic yesterday to report an odor in his wound. He had  been to see plastic surgery at [facility] at our request after his last visit on 09/21/16; we have been seeing him for several months with a large stage III wound. He had been sent to general surgery for consideration of a colostomy, that appointment was not until mid June He comes in today with a temperature of 101. He is reporting an odor in the wound since last weekend. 01/10/17 Readmission: 01/10/17 On evaluation today it is noted that patient has been seen by plastic surgery at [facility] since he was last evaluated here. They did discuss with him the possibility of a flap according to the notes but unfortunately at this point he was not quite ready to proceed with surgery and instead wanted to give the Wound VAC a try. In the hospital they were able to get a good seal on the Wound VAC. Unfortunately since that time they have been having trouble in regard to his current home health company keeping a simple on the Wound VAC. He would like to switch to a different home health company. With that being said it sounds as if the problem is that his wound VAC is not feeling at the lower portion of his back and he tells me that he can take some of the clear plastic and put over that area when the sill breaks and it will correct it for time. He has no discomfort or pain which is good news. He has been treated with IV vancomycin since he was last seen here and has an appointment with a infectious disease specialist in two days on 01/12/17. Otherwise he was transferred back to [place] for continuing to monitor and manage is wound as she progresses with a Wound VAC for the time being. 01/17/17 on evaluation today patient continues to show evidence of slight  improvement with the Wound VAC fortunately there's no evidence of infection or otherwise worsening condition in general. Nonetheless we were unable to get him switch to [facility] in regard to home help from his current company. I'm not sure the reasoning behind but for some reason he was not accepted as a patient with him. Continue to apply the Wound VAC which does still show that some maceration around the wound edges but the wound measurements were slightly improved. No fevers, chills, nausea, or vomiting noted at this time. 02/14/17; this patient I have not seen in 5 months although he has been readmitted to our clinic seen by our physician assistant Jeri Cos twice in early August. I have looked through [facility] notes care everywhere. The patient saw plastic surgery in May [Dr. Bhatt}. The patient was sent to general surgery and ultimately had a colostomy placed. On 11/29/16. This was after he was admitted to [name] sometime in May. An MRI of the pelvis on 5/23 showed osteomyelitis of the coccyx. An attempt was made to drain fluid that was not successful. He was treated with empiric broad-spectrum antibiotics VAC/cefepime/Flagyl starting on 11/02/16 with plans for a 6 week course. According to their notes he was sent to a nursing home. Was last seen by Dr. Myriam Jacobson of plastic surgery on 12/28/16. The first part of the note is a long dissertation about the difficulties finding adequate patients for flap closure of pressure ulcers. At that time the wound was noted to be stage IV based I think on underlying infection no exposed bone and healthy granulation tissue. 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 He was last seen  by infectious disease 01/12/17 A Dr. Uvaldo Rising. His note says that Mr. Kralik was not interested in a flap closure for referring a trial of the wound VAC. As previously anticipated the wound VAC could not be maintained as an outpatient in the  community. He is now using something similar to a Dakin's wet to dry recommended by [facility] VASHE solution. He is placing this twice a day himself. This is almost s hopeless setting in terms of heeling 02/28/17; he is using a Dakin's wet to dry. Most of the wound surface looks satisfactory however the deeper area over his coccyx now has exposed bone I'm not sure if I noted this last week. 03/21/17; patient is usingVASHE solution wet to dry which I gather is a variation on Dakin solution. He has home health changing this 3 times a week the other days he does this himself. His appointment with plastic surgery 04/18/17; patient continues to use a variant of Dakin solution I believe. His wound continues to have a clean viable surface. The 2 areas of exposed bone in the center of this wound had closed over. He has an appointment with plastic surgery on December 5 at which time I hope that there'll be a plan for myocutaneous flap closure In looking through [place] link I couldn't find any more plastic surgery appointments. I did come across the fact that he is been followed by Bradley [name] ([id number]) hematology for a microcytic hypochromic anemia. He had a reasonably normal looking hemoglobin electrophoresis. His iron level was 10 and according to the patient he is going for IV iron infusions starting tomorrow. He had a sedimentation rate of 74. More problematically from a pure wound care point of view his albumin was 2.7 earlier this month 05/17/17; this is a patient I follow monthly. He has a large now stage IV wound over his bilateral buttocks with close proximity to his anal opening. More recently he has developed a large area with exposed bone in the center of this probably secondary to the underlying osteomyelitis E had in the summer. He also follows with Dr. Myriam Jacobson at [facility] who is plastic surgery. He had an appointment earlier this month and according to the patient Dr. Myriam Jacobson does not want to  proceed with any attempted flap closure. Although I do not have current access to her note in care everywhere this is likely due to exposed bone. Again according to the patient they did a bone biopsy. He is still using a variant of Dakin solution changing twice a day. He has home health. The patient is not able to give me a firm answer about how long he spends on this in his wheelchair The patient also states that Dr. Myriam Jacobson wanted to reconsider a wound VAC. I really don't see this as a viable option at least not in the outpatient setting. The wound itself is frankly to close to his anal opening to maintain a seal. The last time we tried to do this home health was unable to manage it. It might be possible to maintain a wound VAC in this setting outside of the home such as a skilled nursing facility or an [place] however I am doubtful about this even in that setting **** READMISSION 09/21/17-He is here for evaluation of stage IV sacral ulcer. Since his last evaluation here in December he has completed treatment for sacral osteomyelitis. He was at [place] for IV therapy and NPWT dressing changes. He was discharged, with home health services,  in February. He admits that while in the skilled facility he had "80%" success with maintaining dressing, since discharge he has had approximately "40%" success with maintaining wound VAC dressing. We discussed at length that this is not a safe or recommended option. We will apply Dakin's wet to dry dressing daily and he will follow-up next week. He is accompanied today by his sister who is willing to assist in dressing changes; they will discuss the social issue as he feels he is capable of changing dressing daily when home health is not able. 09/28/17-He is here in follow-up evaluation for stage IV sacral pressure ulcer. He has been using the Dakin's wet-to-dry daily; he continues with home health. He is not accompanied by anyone at this visit. He will  follow up in two weeks per his request/preference. 10/12/17 on evaluation today patient appears to be doing very well. The Dakin solution went to dry packings do seem to be helping him as far as the sacral wound is concerned I'm not seeing anything that has me more concerned as far as infection or otherwise is concerned. Overall I'm pleased with the appearance of the wound. 10/26/17-He is here in follow up evaluation for a stage IV sacral ulcer. He continues with daily Dakin's wet-to-dry. He is voicing no complaints or concerns. He will follow-up in 2 weeks 11/16/17-He is here for follow up evaluation for a palliative stage IV sacral pressure ulcer. We will continue with Dakin's wet-to-dry. He will follow- up in 4 weeks. He is expressing concern/complaint regarding new bed that has arrived, stating he is unable to manipulate/maneuver it due to the bed crank being at the foot of the bed. 12/14/17-He is here for evaluation for palliative stage IV sacral ulcer. He is voicing no complaints or concerns. We will continue with one-to-one ratio of saline and dakins. He will follow-up in 3 weeks 01/04/18-He is here in follow-up evaluation for palliative stage IV sacral ulcer. He is inquiring about reinstating the negative pressure wound therapy. We discussed at length that the negative pressure wound therapy in the home setting has not been successful for him repeatedly with loss of cereal and unavailability of 24/7 help; reminded him that home health is not available 24/7 when loss of seal occurs. He does verbalize understanding to this and does not pursue. We also discussed the palliative nature of this ulcer (given no significant change/improvement in measurement/appearance, not a candidate for muscle flap per plastic surgery, and continued independent living) and that the goal is for maintenance, decrease in infection and minimizing/avoiding deterioration given that he is independent in his care, does not have  home health and requires daily dressing changes secondary to drainage amount. He is inquiring about a wound clinic in [facility], I have informed him that I am unfamiliar with that clinic but that he is encouraged to seek another opinion if that is his desire. We will continue with dakins and he will follow up in three weeks 01/25/18-He is here in follow-up evaluation for palliative stage IV sacral ulcer. He continues with Dakin's/saline 1:1 mixture wet to dry dressing changes. He states he has an appointment at [facility] on 9/17 for evaluation of surgical intervention/closure of the sacral ulcer. He will follow-up here in 4 weeks Readmission: 07/16/2019 upon evaluation today patient appears to be doing really about the same as when he was previously seen here in the wound care center. He most recently was a patient of [name] back when she was still working here in the  center but had been referred to [facility] for consideration of a flap. With that being said the surgeon there at [facility] stated that this was too large for her flap and they have been attempting to get this smaller in order to be able to proceed with a flap. Nonetheless unfortunately he has had a cycle of going back and forth between the osteomyelitis flaring and then sent him back and then making a little bit of progress only to be sent back again. It sounds like most recently they have been using a Iodoflex type dressing at this point which does not seem to have done any harm by any means. With that being said this wound seems to be quite large for using Iodoflex throughout and subsequently I think he may do much better with the use of Vioxx moistened gauze which would be safe for the new tissue growing and also keep the wound quite nice and clean. The patient is not opposed to this he in fact states that his home health nurse had mentioned this as a possibility as well. 07/23/2019 upon evaluation today patient actually appears to be doing very well in regard  to his sacral wound. In fact this is much healthier the measurements not terribly different but again with a wound like this at home necessarily expect a a lot of change as far as the overall measurements are concerned in just 1 week's time. This is going be a much longer term process at this point. With that being said I do think that he is very healthy appearing as far as the base of the wound is concerned. 08/06/2019 upon evaluation today patient actually appears to be doing about the same with regard to his wound to be honest. There is really not a significant improvement overall based on what I am seeing today. Fortunately there is no signs of significant systemic infection. No fevers, chills, nausea, vomiting, or diarrhea. With that being said there is odor to the drainage from the wound and subsequently also what appears to be increased drainage based on what we are seeing today as well as what his home health nurse called [place] about that she was concerned with as well. 08/13/2019 upon evaluation today patient appears to be doing about the same at this point with regard to his wound although I think the dressing may be a little less drainage wise compared to what it was previous. Fortunately there is no signs of active infection at this time. No fevers, chills, nausea, vomiting, or diarrhea. He has been taking the antibiotic for only 3 days. 08/23/2019 upon evaluation today patient appears to be doing not nearly as well with regard to his wound. In general really not making a lot of progress which is unfortunate. There does not appear to be any signs of active infection at this time. No fevers, chills, nausea, vomiting, or diarrhea. With that being said the patient unfortunately does seem to be experiencing continued epiboly around the edges of the wound as well as significant scar tissue he has been in the majority of his day sitting in his chair which is likely a big reason for all of this.  Fortunately there is no Graumann, Lewayne A. ([id number]) signs of active infection at this time. No fevers, chills, nausea, vomiting, or diarrhea. 08/29/2019 upon evaluation today patient's wound actually appears to be showing some signs of improvement. The region of few areas of new skin growth around the edges of the wound even where he  has some of the epiboly. This is actually good news and I think that he has been very aggressive and offloading over the past week since we showed him the pictures of his wound last week. There still may be a chance that he is going require some sharp debridement to clear away some of these edges but to be honest I think that is can be quite an undertaking. The main reason is that he has a lot of thickened scar tissue and it is good to bleed quite significantly in my opinion. The I think if organ to do that we may even need to have a portable or disposable cautery in order to make sure that we are able to get the area completely sealed up as far as bleeding is concerned. I do have one that we can utilize. However being that the wound looks so much better right now would like to give this 2 weeks to see how things stand and look at that point before making any additional recommendations. 09/12/2019 upon evaluation today patient appears to be making some progress as far as offloading is concerned. He still has a substantial wound but nonetheless he tells me he is off loading much more aggressively at this point. This is obviously good news. No fevers, chills, nausea, vomiting, or diarrhea. 4/22; I have not seen this patient in quite some time. No major change in the condition of the wound or its wound volume. Surrounding maceration of the skin around the edges of the wound. The wound is fairly substantial. To close to the anal opening to consider a wound VAC. He had extensive trials of plastic surgery at [facility] which really does not result in any improvement. His  wound bed is however clean 10/10/2019 upon evaluation today patient appears to be doing well with regard to the wound in the sacral region. He has been tolerating the dressing changes without complication. Fortunately there is no signs of infection and he actually has some epithelial growth noted as well. He tells me has been trying to continue to offload this area which is excellent that is what he needs to do most as far as what he can have in his control. Otherwise I feel like the collagen with a saline moistened gauze behind has been beneficial for him. 5/20; collagen and normal saline wet-to-dry. Although the tissue when this large sacral wound looks reasonable there is been absolutely no benefit to the overall closure. He has macerated skin around this. He tells me that he spends a large part of the day up in the wheelchair. He still has home health 11/07/2019 upon evaluation today patient appears to be doing well with regard to his wounds at this point. Fortunately there is no signs of active infection at this time. Overall I feel like his sacral wound in general appears to be doing quite nicely. I am very pleased with overall how things are progressing and I feel like the patient is making excellent progress towards resolution. With that being said he still has a long ways to go before we expect to see this completely healed. 11/21/2019 upon evaluation today patient appears to be doing about the same in regard to his sacral wound. Unfortunately he is really not making much in the way of improvement when I questioned him about how much time he is really spending sitting on his bottom he tells me that the majority of his day is mainly in that position though he does  spend some time in bed. It does not sound like it is enough however neither does it look like it is enough. 12/24/2019 upon evaluation today patient appears to be doing really about the same there is no significant improvement overall in  his wound bed. Last time he was seen here we did have a discussion about the possibility of seeing if I can get a surgeon to evaluate and potentially clear away some of the thickened edges of the wound in order to allow this wound to potentially have a chance of closing with a wound VAC. Right now we could not even get a wound VAC to seal with some of the rolled and thickened edges. With that being said elected to the patient to think about this to discuss today. At this point the patient does want to see if I could make the referral to potentially see if the surgeon would be able to perform this and subsequently he agrees to go to a skilled nursing facility following if they are able to do this in order to allow for a wound VAC and to get this wound to heal more effectively and quickly. In my opinion the only way that I would recommend that he see the surgeon to clear away the edges would be if he is also in agreement with going to skilled nursing facility as I know he cannot appropriately offload at home to take care of this and that setting. 01/07/2020 on evaluation today patient appears to be doing about the same in regard to his wound. There is no signs of active infection at this time which is great news. All in all he is really doing about the same. We have made referral to surgery to see if they can potentially perform debridement to clear away some of the scarred edging of the wound so we could potentially see about a wound VAC to try to help this heal more efficiently. Again we have discussed that if this occurs he would be in a skilled nursing facility following or release would need to be in order to appropriately offload. Nonetheless we are still in the process of working that referral. 01/21/2020 upon evaluation today patient appears to be doing about the same in regard to his sacral wound. He did see Dr. Lysle Pearl Dr. Ines Bloomer feeling was that there was not anything surgically he could offer at  this point as he did not feel like that a wound VAC would likely be appropriate for the patient. With that being said he did recommend potential for trying to get the patient into a skilled nursing facility as an outpatient and they will get me working on that as well. Fortunately there is no signs of active infection at this time. 11/10; this is a patient that has not been seen here in quite a bit of time while we were in our alternative venue. As well I have not seen this patient in probably over 2 years. As I remember things with him he has had this pressure ulcer encompassing his lower sacrum and coccyx and the surrounding buttocks for a prolonged period of time. When he first came here the surface looked fairly good and the wound was smaller we attempted a wound VAC but we could never keep this in place. Because of nonresponse of the wound to topical dressings we referred him to [facility] plastic surgery where preparation was being made for flap closure. Intact he had a colostomy however after that he  was deemed to have 2 large wound for flap closure. He did have osteomyelitis in the coccyx area in 2019. He is using wet-to-dry he has home health. They are managing his ostomy supplies as well. The wound is substantially larger than what I remember. Versus last time he was here to wound months or so ago he has an unstageable wound on the left upper thigh posteriorly. He tells me he is in his chair quite a bit especially recently 12/1; patient comes back to clinic. He did not get the lab work done apparently home health would not do this they referred him to the lab where he is apparently having a CBC checked because he is on iron infusions. We will try to get the lab work I ordered done there. He also did not get the x-ray he says he does not remember the discussion. The issue is that this large wound has probing areas to bone. He may have underlying osteomyelitis again he was treated for  this previously I think in 2019 12/15; the patient did have lab work done. Notable for iron deficiency anemia with a hemoglobin of 9.7 although he is following with hematology for this his CRP was 7.1 which is up from his previous value. sedimentation rate of 91 which is also higher. However he maintains a relatively high sedimentation rate. Most disturbingly was an albumin of 2.8 although this is also somewhat higher than the 2.2 we had previously. With some difficulty I think a home health nurse actually made him some Dakin's solution he is using a Dakin's wet-to-dry to the large sacral wound. He has the area on the left upper thigh as well PRIMO, MAJEWSKI ([id number]) 06/17/2020; I follow this patient on a monthly basis. 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 At one point he had underlying osteomyelitis here and he received antibiotic therapy. I believe this was done through [facility] We have been using palliative Dakin's solution wet-to-dry on this which is really This very large wound about the same with healthy surface. No active infection that is obvious. He has developed an area on the left posterior thigh and now a mirror image area on the right posterior thigh noted pressure injuries from the wheelchair. This occurs even though he has a Roho cushion which is apparently new I have been forwarded lab work from 05/15/2020. At that point his hemoglobin was 9.2 down from 13.8 on 10/5 this is microcytic hypochromic. He has known iron deficiency although the exact cause of this is never really been clear to me. He follows with hematology oncology and has been receiving IV iron he is supposed to follow-up in mid January and I told the patient this. From wound care point of view his albumin is 2.9 sedimentation rate is 74. His sedimentation rate has always been elevated in this range however his C-reactive protein was 82 which is way above anything previously measured. I see  that on 05/08/2020 this was 7.1 I am not sure why the rapid discrepancy. He is supposed to have an x-ray of his lower sacrum looking for evidence of osteomyelitis that he was previously treated for. Is possible he would also require more advanced imaging such as another C CT or MRI He comes in also with paperwork for a Cl[place]ron air-fluidized bed with a trapeze bar. I asked him if he would be able to transfer in and out of this bed . I am not opposed to ordering it,  but I do not want to make his transfers more difficult than they already are. He has a [employer] fast streamer 07/15/2020; he is managed to get his Cl[place]ron air-fluidized bed. He has a new electric wheelchair with a wheelchair cushion. The x-ray I did showed some sacral bone destruction although this did not seem progressive him last time. He was treated for osteomyelitis in this area previously in 2018. His sedimentation rate is 91 CRP 7.1 albumin 2.8. We are using Dakin's wet-to-dry. He has [facility] home health 4/6; 30-monthfollow-up. He has a Cl[place]ron air-fluidized bed. He has the wheelchair cushion. His major wound on the coccyx looks somewhat better including including both of uKoreamy nurse and my feeling that it is closed in somewhat. Wound is still too close to his anal opening to consider a wound VAC. [facility] but for 1 reason or another this never moved forward I was never really certain what the issue was. He has an area on the left upper thigh in the gluteal fold. This is a clean wound. 6/1; 232-monthollow-up. I follow this man on a palliative basis he has a chronic stage IV wound over the lower sacrum coccyx and surrounding buttocks. He has an air-fluidized bed and a wheelchair cushion but he spends more time in the wheelchair than he does off this area by his own admission. I do not think the area is really changed that much although it is probably deeper in 1 section. He  does tell usKoreahat he was discharged from home health because of lack of change of the wound. This was WeWinn-DixieHe was treated for underlying osteomyelitis several years ago and he may have underlying chronic osteomyelitis. I do not believe he ever got the x-ray I ordered 8/3; patient presents for his 2-14-monthllow-up. He reports no issues or complaints today. He continues to use Dakin's wet-to-dry dressings to his sacrum and silver alginate to his left lateral thigh wound. He denies signs of infection. Patient History Information obtained from Patient. Family History Cancer - Mother, Heart Disease - Mother,Father, Seizures - Child, No family history of Diabetes, Hereditary Spherocytosis, Hypertension, Kidney Disease, Lung Disease, Stroke, Thyroid Problems, Tuberculosis. Social History Former smoker, Marital Status - Single, Alcohol Use - Never, Drug Use - No History, Caffeine Use - Moderate - coffee. Medical History Eyes Denies history of Cataracts, Glaucoma, Optic Neuritis Hematologic/Lymphatic Patient has history of Anemia Denies history of Hemophilia, Human Immunodeficiency Virus, Lymphedema, Sickle Cell Disease Respiratory Denies history of Aspiration, Asthma, Chronic Obstructive Pulmonary Disease (COPD), Pneumothorax, Sleep Apnea, Tuberculosis Cardiovascular Patient has history of Hypertension Denies history of Angina, Arrhythmia, Congestive Heart Failure, Coronary Artery Disease, Deep Vein Thrombosis, Hypotension, Myocardial Infarction, Peripheral Arterial Disease, Peripheral Venous Disease, Phlebitis, Vasculitis Gastrointestinal Denies history of Cirrhosis , Colitis, Crohn s, Hepatitis A, Hepatitis B, Hepatitis C Endocrine Denies history of Type I Diabetes, Type II Diabetes Genitourinary Denies history of End Stage Renal Disease Immunological Denies history of Lupus Erythematosus, Raynaud s, Scleroderma Integumentary (Skin) Patient has history of History of pressure  wounds Musculoskeletal Patient has history of Rheumatoid Arthritis Denies history of Gout, Osteoarthritis, Osteomyelitis Neurologic Patient has history of Paraplegia Denies history of Dementia, Neuropathy, Quadriplegia, Seizure Disorder Oncologic Denies history of Received Chemotherapy, Received Radiation Psychiatric Bradley [name], [id number] history of Anorexia/bulimia, Confinement Anxiety [facility] Notes Gastrointestinal colostomy Genitourinary frequent urination  Objective Constitutional Vitals Time Taken: 11:03 AM, Temperature: 98.2 F, Pulse: 64 bpm, Respiratory Rate: 18 breaths/min, Blood Pressure: 129/60 mmHg. General Notes: Sacral ulcer: Granulation tissue throughout with more depth in the center. No probing to bone. No obvious signs of infection. Integumentary (Hair, Skin) Wound #10 status is Open. Original cause of wound was Pressure Injury. The date acquired was: 06/07/2015. The wound has been in treatment 77 weeks. The wound is located on the Midline Sacrum. The wound measures 10.5cm length x 4cm width x 3.5cm depth; 32.987cm^2 area and 115.454cm^3 volume. There is bone and Fat Layer (Subcutaneous Tissue) exposed. There is no tunneling or undermining noted. There is a large amount of serosanguineous drainage noted. The wound margin is epibole. There is large (67-100%) red, pink granulation within the wound bed. There is a small (1-33%) amount of necrotic tissue within the wound bed including Adherent Slough. Wound #12 status is Open. Original cause of wound was Gradually Appeared. The date acquired was: 03/06/2020. The wound has been in treatment 38 weeks. The wound is located on the Left,Proximal Upper Leg. The wound measures 3.6cm length x 1.1cm width x 0.1cm depth; 3.11cm^2 area and 0.311cm^3 volume. There is Fat Layer (Subcutaneous Tissue) exposed. There is no tunneling or undermining noted.  There is a medium amount of serosanguineous drainage noted. The wound margin is flat and intact. There is large (67-100%) red, friable granulation within the wound bed. There is no necrotic tissue within the wound bed. 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 Schellinger was previously a patient of Dr. Janalyn Rouse. He is new to me today. He has been followed in our clinic since 2017 for a sacral ulcer. In 2019 he was treated for sacral osteomyelitis with IV antibiotics. He has seen 3 different plastic surgeons for evaluation of a flap however surgery has not been an option. He has had different treatment modalities including wound VAC. Dr. Dellia Nims has discussed with him that at this point this is palliative wound care. I agree with this. I rediscussed this with the patient. Overall the wounds are stable with some improvement in appearance. No signs of infection on exam. No probing to bone. I recommended continuing Dakin's wet-to-dry dressings To the sacral region. To the left lateral thigh the open wound appears healthy and clean. I recommended continuing silver alginate to this area. He would like to follow-up every 2 months. I told him to call with any questions or concerns and we can see him sooner if needed. Plan Follow-up Appointments: Return Appointment in: - 2 months Bathing/ Shower/ Hygiene: May shower; gently cleanse wound with antibacterial soap, rinse and pat dry prior to dressing wounds Off-Loading: HAGEN, MAKAREWICZ ([id number]) Hospital bed/mattress Air fluidized (Group 3) Turn and reposition every 2 hours Additional Orders / Instructions: Follow Nutritious Diet and Increase Protein Intake The following medication(s) was prescribed: Dakin's Solution miscellaneous 0.25 % solution solution miscellaneous for Moisten  gauze and pack in wound once a day starting 01/06/2021 WOUND #10: - Sacrum Wound Laterality: Midline Cleanser: Vashe 5.8 (oz) 1 x Per Day/30 Days Discharge Instructions: Use vashe 5.8 (oz) as directed-patient to self purchase Primary Dressing: Gauze (Generic) 1 x Per Day/30 Days Discharge Instructions: Moistened with Vashe/Dakins and packed lightly into wound bed Secondary Dressing: ABD Pad 5x9 (in/in) (DME) (Generic) 1 x Per Day/30 Days Discharge Instructions: Cover with ABD pad Secondary Dressing: Lauraine Rinne  Medium 4x5 (in/in) (DME) (Generic) 1 x Per Day/30 Days Discharge Instructions: Apply to wound as directed. Do not cut. Secured With: 58M Medipore H Soft Cloth Surgical Tape, 3x10 (in/yd) (DME) (Generic) 1 x Per Day/30 Days Discharge Instructions: Secure dressing WOUND #12: - Upper Leg Wound Laterality: Left, Proximal Cleanser: Normal Saline 3 x Per Week/30 Days Discharge Instructions: Wash your hands with soap and water. Remove old dressing, discard into plastic bag and place into trash. Cleanse the wound with Normal Saline prior to applying a clean dressing using gauze sponges, not tissues or cotton balls. Do not scrub or use excessive force. Pat dry using gauze sponges, not tissue or cotton balls. Primary Dressing: AquacelAg Advantage Dressing, 2X2 (in/in) (DME) (Generic) 3 x Per Week/30 Days Discharge Instructions: Apply to wound as directed Secondary Dressing: ABD Pad 5x9 (in/in) (DME) (Generic) 3 x Per Week/30 Days Secured With: 58M Medipore H Soft Cloth Surgical Tape, 2x2 (in/yd) (DME) (Generic) 3 x Per Week/30 Days Discharge Instructions: Secure dressing 1. Dakin's wet-to-dry to the sacral wound 2. Silver alginate to the left lateral thigh wound 3. Follow-up in 2 weeks Electronic Signature(s) Signed: 01/07/2021 8:55:20 AM By: Gretta Cool, BSN, RN, CWS, Kim RN, BSN Signed: 01/07/2021 12:17:40 PM By: Kalman Shan DO Previous Signature: 01/07/2021 8:48:47 AM Version By: Gretta Cool BSN, RN,  CWS, Kim RN, BSN Previous Signature: 01/06/2021 11:55:12 AM Version By: Kalman Shan DO Entered By: Gretta Cool BSN, RN, CWS, Kim on 01/07/2021 08:55:20 ANSLEY, SAHLI ([id number]) -------------------------------------------------------------------------------- ROS/PFSH Details Patient Name: YOUNG, GAYNOR A. Date of Service: 01/06/2021 10:45 AM Medical Record Number: [id number] Patient Account Number: [id number] Date of Birth/Sex: 1945/09/29 (75 y.o. M) Treating RN: Cornell Barman Primary Care Provider: [name] Other Clinician: Referring Provider: [name] Treating Provider/Extender: Yaakov Guthrie in Treatment: 59 Label Progress Note Print Version as History and Physical for this encounter Information Obtained From Patient Eyes Medical History: Negative for: Cataracts; Glaucoma; Optic Neuritis Hematologic/Lymphatic Medical History: Positive for: Anemia Negative for: Hemophilia; Human Immunodeficiency Virus; Lymphedema; Sickle Cell Disease Respiratory Medical History: Negative for: Aspiration; Asthma; Chronic Obstructive Pulmonary Disease (COPD); Pneumothorax; Sleep Apnea; Tuberculosis Cardiovascular Medical History: Positive for: Hypertension Negative for: Angina; Arrhythmia; Congestive Heart Failure; Coronary Artery Disease; Deep Vein Thrombosis; Hypotension; Myocardial Infarction; Peripheral Arterial Disease; Peripheral Venous Disease; Phlebitis; Vasculitis Gastrointestinal Medical History: Negative for: Cirrhosis ; Colitis; Crohnos; Hepatitis A; Hepatitis B; Hepatitis C Past Medical History Notes: colostomy Endocrine Medical History: Negative for: Type I Diabetes; Type II Diabetes Genitourinary Medical History: Negative for: End Stage Renal Disease Past Medical History Notes: frequent urination Immunological Medical History: Negative for: Lupus Erythematosus; Raynaudos; Scleroderma Integumentary (Skin) Medical History: Positive for: History of  pressure wounds Musculoskeletal Medical History: Positive for: Rheumatoid Arthritis ARMSTER, SLOVER A. ([id number]) Negative for: Gout; Osteoarthritis; Osteomyelitis Neurologic Medical History: Positive for: Paraplegia Negative for: Dementia; Neuropathy; Quadriplegia; Seizure Disorder Oncologic Medical History: Negative for: Received Chemotherapy; Received Radiation Psychiatric Medical History: Negative for: Anorexia/bulimia; Confinement Anxiety Immunizations Pneumococcal Vaccine: Received Pneumococcal Vaccination: No Immunization Notes: up to date Implantable Devices None Hospitalization / Surgery History Type of Hospitalization/Surgery December 2020 [facility] Family and Social History Cancer: Yes - Mother; Diabetes: No; Heart Disease: Yes - Mother,Father; Hereditary Spherocytosis: No; Hypertension: No; Kidney Disease: No; Lung Disease: No; Seizures: Yes - Child; Stroke: No; Thyroid Problems: No; Tuberculosis: No; Former smoker; Marital Status - Single; Alcohol Use: Never; Drug Use: No History; Caffeine Use: Moderate - coffee; Financial Concerns: No; Food, Clothing or Shelter Needs: No; Support System Lacking: No; Transportation Concerns:  No Electronic Signature(s) Signed: 01/07/2021 8:54:21 AM By: Gretta Cool, BSN, RN, CWS, Kim RN, BSN Signed: 01/07/2021 12:17:40 PM By: Kalman Shan DO Previous Signature: 01/06/2021 11:55:12 AM Version By: Kalman Shan DO Previous Signature: 01/06/2021 5:43:31 PM Version By: Gretta Cool, BSN, RN, CWS, Kim RN, BSN Entered By: Gretta Cool, BSN, RN, CWS, Kim on 01/07/2021 [id number] Bradley [name] ([id number]) -------------------------------------------------------------------------------- [place] Details Patient Name: MARSHAUN, MARRESE A. Date of Service: 01/06/2021 Medical Record Number: [id number] Patient Account Number: [id number] Date of Birth/Sex: Sep 01, 1945 (75 y.o. 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 Facility Procedures CPT4 Code: [id number] Description: 99213 - WOUND CARE VISIT-LEV 3 EST PT Modifier: Quantity: 1 Physician Procedures CPT4 Code: [id number] Description: [id number] - WC PHYS LEVEL 3 - EST PT Modifier: Quantity: 1 CPT4 Code: Description: ICD-10 Diagnosis Description L89.154 Pressure ulcer of sacral region, stage 4 G82.20 Paraplegia, unspecified L97.128 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

## 2021-01-07 NOTE — Progress Notes (Signed)
EUGEAN, STAKE ([id number]) Visit Report for 01/06/2021 Arrival Information Details Patient Name: Bradley [name], Bradley [name]. Date of Service: 01/06/2021 10:45 AM Medical Record Number: [id number] Patient Account Number: [id number] Date of Birth/Sex: January 27, 1946 (75 y.o. M) Treating RN: Dolan Amen Primary Care Shonica Weier: [name] Other Clinician: Referring Briceson Broadwater: [name] Treating Danniel Tones/Extender: Yaakov Guthrie in Treatment: 24 Visit Information History Since Last Visit Pain Present Now: No Patient Arrived: Wheel Chair Arrival Time: 11:04 Accompanied By: self Transfer Assistance: [place] Patient Identification Verified: Yes Secondary Verification Process Completed: Yes Patient Requires Transmission-Based Precautions: No Patient Has Alerts: No Electronic Signature(s) Signed: 01/06/2021 1:16:47 PM By: Donnamarie Poag Signed: 01/06/2021 4:40:35 PM By: Dolan Amen RN Entered By: Dolan Amen on 01/06/2021 11:05:17 Bradley [name] ([id number]) -------------------------------------------------------------------------------- Clinic Level of Care Assessment Details Patient Name: Bradley [name]. Date of Service: 01/06/2021 10:45 AM Medical Record Number: [id number] Patient Account Number: [id number] Date of Birth/Sex: June 25, 1945 (75 y.o. M) Treating RN: Dolan Amen Primary Care Shondra Capps: [name] Other Clinician: Referring Ledora Delker: [name] Treating Elsy Chiang/Extender: Yaakov Guthrie in Treatment: 29 Clinic Level of Care Assessment Items TOOL 4 Quantity Score X - Use when only an EandM is performed on FOLLOW-UP visit 1 0 ASSESSMENTS - Nursing Assessment / Reassessment X - Reassessment of Co-morbidities (includes updates in patient status) 1 10 X- 1 5 Reassessment of Adherence to Treatment Plan ASSESSMENTS - Wound and Skin Assessment / Reassessment '[]'$  - Simple Wound Assessment / Reassessment - one wound 0 X- 2 5 Complex Wound  Assessment / Reassessment - multiple wounds '[]'$  - 0 Dermatologic / Skin Assessment (not related to wound area) ASSESSMENTS - Focused Assessment '[]'$  - Circumferential Edema Measurements - multi extremities 0 '[]'$  - 0 Nutritional Assessment / Counseling / Intervention '[]'$  - 0 Lower Extremity Assessment (monofilament, tuning fork, pulses) '[]'$  - 0 Peripheral Arterial Disease Assessment (using hand held doppler) ASSESSMENTS - Ostomy and/or Continence Assessment and Care '[]'$  - Incontinence Assessment and Management 0 '[]'$  - 0 Ostomy Care Assessment and Management (repouching, etc.) PROCESS - Coordination of Care X - Simple Patient / Family Education for ongoing care 1 15 '[]'$  - 0 Complex (extensive) Patient / Family Education for ongoing care '[]'$  - 0 Staff obtains Programmer, systems, Records, Test Results / Process Orders '[]'$  - 0 Staff telephones HHA, Nursing Homes / Clarify orders / etc '[]'$  - 0 Routine Transfer to another Facility (non-emergent condition) '[]'$  - 0 Routine Hospital Admission (non-emergent condition) '[]'$  - 0 New Admissions / Biomedical engineer / Ordering NPWT, Apligraf, etc. '[]'$  - 0 Emergency Hospital Admission (emergent condition) X- 1 10 Simple Discharge Coordination '[]'$  - 0 Complex (extensive) Discharge Coordination PROCESS - Special Needs '[]'$  - Pediatric / Minor Patient Management 0 '[]'$  - 0 Isolation Patient Management '[]'$  - 0 Hearing / Language / Visual special needs '[]'$  - 0 Assessment of Community assistance (transportation, D/C planning, etc.) '[]'$  - 0 Additional assistance / Altered mentation '[]'$  - 0 Support Surface(s) Assessment (bed, cushion, seat, etc.) INTERVENTIONS - Wound Cleansing / Measurement Bradley [name], Bradley A. ([id number]) '[]'$  - 0 Simple Wound Cleansing - one wound X- 2 5 Complex Wound Cleansing - multiple wounds X- 1 5 Wound Imaging (photographs - any number of wounds) '[]'$  - 0 Wound Tracing (instead of photographs) '[]'$  - 0 Simple Wound Measurement - one wound X- 2  5 Complex Wound Measurement - multiple wounds INTERVENTIONS - Wound Dressings '[]'$  - Small Wound Dressing one or multiple wounds 0 X- 1 15 Medium Wound Dressing one  or multiple wounds '[]'$  - 0 Large Wound Dressing one or multiple wounds '[]'$  - 0 Application of Medications - topical '[]'$  - 0 Application of Medications - injection INTERVENTIONS - Miscellaneous '[]'$  - External ear exam 0 '[]'$  - 0 Specimen Collection (cultures, biopsies, blood, body fluids, etc.) '[]'$  - 0 Specimen(s) / Culture(s) sent or taken to Lab for analysis X- 1 10 Patient Transfer (multiple staff / Harrel Lemon Lift / Similar devices) '[]'$  - 0 Simple Staple / Suture removal (25 or less) '[]'$  - 0 Complex Staple / Suture removal (26 or more) '[]'$  - 0 Hypo / Hyperglycemic Management (close monitor of Blood Glucose) '[]'$  - 0 Ankle / Brachial Index (ABI) - do not check if billed separately X- 1 5 Vital Signs Has the patient been seen at the hospital within the last three years: Yes Total Score: 105 Level Of Care: New/Established - Level 3 Electronic Signature(s) Signed: 01/06/2021 1:16:47 PM By: Donnamarie Poag Signed: 01/06/2021 4:40:35 PM By: Dolan Amen RN Entered By: Dolan Amen on 01/06/2021 12:47:08 Bradley [name] ([id number]) -------------------------------------------------------------------------------- Encounter Discharge Information Details Patient Name: Bradley Loa A. Date of Service: 01/06/2021 10:45 AM Medical Record Number: [id number] Patient Account Number: [id number] Date of Birth/Sex: [birth year] (75 y.o. M) Treating RN: Dolan Amen Primary Care Teana Lindahl: [name] Other Clinician: Referring Rosaura Bolon: [name] Treating Darrek Leasure/Extender: Yaakov Guthrie in Treatment: 82 Encounter Discharge Information Items Discharge Condition: Stable Ambulatory Status: Wheelchair Discharge Destination: Home Transportation: Private Auto Accompanied By: self Schedule Follow-up Appointment:  Yes Clinical Summary of Care: Electronic Signature(s) Signed: 01/06/2021 12:48:38 PM By: Dolan Amen RN Previous Signature: 01/06/2021 12:42:07 PM Version By: Dolan Amen RN Entered By: Dolan Amen on 01/06/2021 12:48:38 Bradley [name] ([id number]) -------------------------------------------------------------------------------- Lower Extremity Assessment Details Patient Name: Bradley [name], Bradley A. Date of Service: 01/06/2021 10:45 AM Medical Record Number: [id number] Patient Account Number: [id number] Date of Birth/Sex: 1945/12/06 (75 y.o. M) Treating RN: Dolan Amen Primary Care Summit Borchardt: [name] Other Clinician: Referring Hemi Chacko: [name] Treating Xenia Nile/Extender: Ricard Dillon Weeks in Treatment: 86 Electronic Signature(s) Signed: 01/06/2021 1:16:47 PM By: Donnamarie Poag Signed: 01/06/2021 4:40:35 PM By: Dolan Amen RN Entered By: Dolan Amen on 01/06/2021 11:26:02 Bradley [name] ([id number]) -------------------------------------------------------------------------------- Multi Wound Chart Details Patient Name: Bradley [name], Bradley A. Date of Service: 01/06/2021 10:45 AM Medical Record Number: [id number] Patient Account Number: [id number] Date of Birth/Sex: 11-Jun-1945 (75 y.o. M) Treating RN: Dolan Amen Primary Care Shaneta Cervenka: [name] Other Clinician: Referring Shemicka Cohrs: [name] Treating Cera Rorke/Extender: Yaakov Guthrie in Treatment: 64 Vital Signs Height(in): Pulse(bpm): 79 Weight(lbs): Blood Pressure(mmHg): 129/60 Body Mass Index(BMI): Temperature(F): 98.2 Respiratory Rate(breaths/min): 18 Photos: [N/A:N/A] Wound Location: Midline Sacrum Left, Proximal Upper Leg N/A Wounding Event: Pressure Injury Gradually Appeared N/A Primary Etiology: Pressure Ulcer Pressure Ulcer N/A Comorbid History: Anemia, Hypertension, History of Anemia, Hypertension, History of N/A pressure wounds, Rheumatoid pressure wounds,  Rheumatoid Arthritis, Paraplegia Arthritis, Paraplegia Date Acquired: 06/07/2015 03/06/2020 N/A Weeks of Treatment: 77 38 N/A Wound Status: Open Open N/A Measurements L x W x D (cm) 10.5x4x3.5 3.6x1.1x0.1 N/A Area (cm) : 32.987 3.11 N/A Volume (cm) : 115.454 0.311 N/A % Reduction in Area: 36.00% 80.20% N/A % Reduction in Volume: 62.70% 80.20% N/A Classification: Category/Stage IV Category/Stage III N/A Exudate Amount: Large Medium N/A Exudate Type: Serosanguineous Serosanguineous N/A Exudate Color: red, brown red, brown N/A Wound Margin: Epibole Flat and Intact N/A Granulation Amount: Large (67-100%) Large (67-100%) N/A Granulation Quality: Red, Pink Red, Friable N/A Necrotic Amount:  Small (1-33%) None Present (0%) N/A Exposed Structures: Fat Layer (Subcutaneous Tissue): Fat Layer (Subcutaneous Tissue): N/A Yes Yes Bone: Yes Fascia: No Fascia: No Tendon: No Tendon: No Muscle: No Muscle: No Joint: No Joint: No Bone: No Epithelialization: None Small (1-33%) N/A Treatment Notes Wound #10 (Sacrum) Wound Laterality: Midline Cleanser Vashe 5.8 (oz) Discharge Instruction: Use vashe 5.8 (oz) as directed-patient to self purchase Peri-Wound Care Bradley [name], Bradley [name] ([id number]) Topical Primary Dressing Gauze Discharge Instruction: Moistened with Vashe/Dakins and packed lightly into wound bed Secondary Dressing ABD Pad 5x9 (in/in) Discharge Instruction: Cover with ABD pad Xtrasorb Medium 4x5 (in/in) Discharge Instruction: Apply to wound as directed. Do not cut. Secured With 35M Medipore H Soft Cloth Surgical Tape, 3x10 (in/yd) Discharge Instruction: Secure dressing Compression Wrap Compression Stockings Add-Ons Wound #12 (Upper Leg) Wound Laterality: Left, Proximal Cleanser Normal Saline Discharge Instruction: Wash your hands with soap and water. Remove old dressing, discard into plastic bag and place into trash. Cleanse the wound with Normal Saline prior to applying a clean  dressing using gauze sponges, not tissues or cotton balls. Do not scrub or use excessive force. Pat dry using gauze sponges, not tissue or cotton balls. Peri-Wound Care Topical Primary Dressing AquacelAg Advantage Dressing, 2X2 (in/in) Discharge Instruction: Apply to wound as directed Secondary Dressing ABD Pad 5x9 (in/in) Secured With 35M Medipore H Soft Cloth Surgical Tape, 2x2 (in/yd) Discharge Instruction: Secure dressing Compression Wrap Compression Stockings Add-Ons Electronic Signature(s) Signed: 01/06/2021 12:46:27 PM By: Dolan Amen RN Previous Signature: 01/06/2021 11:55:12 AM Version By: Kalman Shan DO Entered By: Dolan Amen on 01/06/2021 12:46:27 Bradley [name] ([id number]) -------------------------------------------------------------------------------- [place] Details Patient Name: Bradley [name], Bradley A. Date of Service: 01/06/2021 10:45 AM Medical Record Number: [id number] Patient Account Number: [id number] Date of Birth/Sex: 1945/06/13 (75 y.o. M) Treating RN: Dolan Amen Primary Care Imre Vecchione: [name] Other Clinician: Referring Lashon Hillier: [name] Treating Jarae Panas/Extender: Yaakov Guthrie in Treatment: 47 Active Inactive Abuse / Safety / Falls / Self Care Management Nursing Diagnoses: Impaired physical mobility Goals: Patient will not develop complications from immobility Date Initiated: 07/16/2019 Target Resolution Date: 02/07/2020 Goal Status: Active Interventions: Assess Activities of Daily Living upon admission and as needed Notes: Electronic Signature(s) Signed: 01/06/2021 12:46:14 PM By: Dolan Amen RN Entered By: Dolan Amen on 01/06/2021 12:46:14 Bradley [name] ([id number]) -------------------------------------------------------------------------------- Pain Assessment Details Patient Name: Bradley Loa A. Date of Service: 01/06/2021 10:45 AM Medical Record Number: [id number] Patient  Account Number: [id number] Date of Birth/Sex: [birth year] (75 y.o. M) Treating RN: Dolan Amen Primary Care Jaylea Plourde: [name] Other Clinician: Referring Yianna Tersigni: [name] Treating Lizvet Chunn/Extender: Yaakov Guthrie in Treatment: 37 Active Problems Location of Pain Severity and Description of Pain Patient Has Paino No Site Locations Rate the pain. Current Pain Level: 0 Pain Management and Medication Current Pain Management: Electronic Signature(s) Signed: 01/06/2021 1:16:47 PM By: Donnamarie Poag Signed: 01/06/2021 4:40:35 PM By: Dolan Amen RN Entered By: Dolan Amen on 01/06/2021 11:05:31 Bradley [name] ([id number]) -------------------------------------------------------------------------------- Patient/Caregiver Education Details Patient Name: Bradley Loa A. Date of Service: 01/06/2021 10:45 AM Medical Record Number: [id number] Patient Account Number: [id number] Date of Birth/Gender: 02/20/1946 (75 y.o. M) Treating RN: Dolan Amen Primary Care Physician: [name] Other Clinician: Referring Physician: [name] Treating Physician/Extender: Yaakov Guthrie in Treatment: 70 Education Assessment Education Provided To: Patient Education Topics Provided Wound/Skin Impairment: Methods: Explain/Verbal Responses: State content correctly Electronic Signature(s) Signed: 01/06/2021 1:16:47 PM By: Donnamarie Poag Signed: 01/06/2021 4:40:35 PM By:  Dolan Amen RN Entered By: Dolan Amen on 01/06/2021 12:47:31 Bradley [name] ([id number]) -------------------------------------------------------------------------------- Wound Assessment Details Patient Name: Bradley [name], ANTILL. Date of Service: 01/06/2021 10:45 AM Medical Record Number: [id number] Patient Account Number: [id number] Date of Birth/Sex: Dec 25, 1945 (75 y.o. M) Treating RN: Dolan Amen Primary Care Bethanee Redondo: [name] Other Clinician: Referring Shavonn Convey:  [name] Treating Crystalle Popwell/Extender: Tito Dine in Treatment: 84 Wound Status Wound Number: 10 Primary Pressure Ulcer Etiology: Wound Location: Midline Sacrum Wound Open Wounding Event: Pressure Injury Status: Date Acquired: 06/07/2015 Comorbid Anemia, Hypertension, History of pressure wounds, Weeks Of Treatment: 77 History: Rheumatoid Arthritis, Paraplegia Clustered Wound: No Photos Wound Measurements Length: (cm) 10.5 Width: (cm) 4 Depth: (cm) 3.5 Area: (cm) 32.987 Volume: (cm) 115.454 % Reduction in Area: 36% % Reduction in Volume: 62.7% Epithelialization: None Tunneling: No Undermining: No Wound Description Classification: Category/Stage IV Wound Margin: Epibole Exudate Amount: Large Exudate Type: Serosanguineous Exudate Color: red, brown Foul Odor After Cleansing: No Slough/Fibrino Yes Wound Bed Granulation Amount: Large (67-100%) Exposed Structure Granulation Quality: Red, Pink Fascia Exposed: No Necrotic Amount: Small (1-33%) Fat Layer (Subcutaneous Tissue) Exposed: Yes Necrotic Quality: Adherent Slough Tendon Exposed: No Muscle Exposed: No Joint Exposed: No Bone Exposed: Yes Treatment Notes Wound #10 (Sacrum) Wound Laterality: Midline Cleanser Vashe 5.8 (oz) Discharge Instruction: Use vashe 5.8 (oz) as directed-patient to self purchase Peri-Wound Care Bradley [name], Bradley [name] ([id number]) Topical Primary Dressing Gauze Discharge Instruction: Moistened with Vashe/Dakins and packed lightly into wound bed Secondary Dressing ABD Pad 5x9 (in/in) Discharge Instruction: Cover with ABD pad Xtrasorb Medium 4x5 (in/in) Discharge Instruction: Apply to wound as directed. Do not cut. Secured With 46M Medipore H Soft Cloth Surgical Tape, 3x10 (in/yd) Discharge Instruction: Secure dressing Compression Wrap Compression Stockings Add-Ons Electronic Signature(s) Signed: 01/06/2021 1:16:47 PM By: Donnamarie Poag Signed: 01/06/2021 4:40:35 PM By: Dolan Amen RN Entered By: Dolan Amen on 01/06/2021 11:22:38 Bradley [name], Bradley [name] ([id number]) -------------------------------------------------------------------------------- Wound Assessment Details Patient Name: Bradley [name], Bradley A. Date of Service: 01/06/2021 10:45 AM Medical Record Number: [id number] Patient Account Number: [id number] Date of Birth/Sex: Oct 18, 1945 (75 y.o. M) Treating RN: Dolan Amen Primary Care Xcaret Morad: [name] Other Clinician: Referring Raygan Skarda: [name] Treating Asencion Loveday/Extender: Tito Dine in Treatment: 68 Wound Status Wound Number: 12 Primary Pressure Ulcer Etiology: Wound Location: Left, Proximal Upper Leg Wound Open Wounding Event: Gradually Appeared Status: Date Acquired: 03/06/2020 Comorbid Anemia, Hypertension, History of pressure wounds, Weeks Of Treatment: 38 History: Rheumatoid Arthritis, Paraplegia Clustered Wound: No Photos Wound Measurements Length: (cm) 3.6 Width: (cm) 1.1 Depth: (cm) 0.1 Area: (cm) 3.11 Volume: (cm) 0.311 % Reduction in Area: 80.2% % Reduction in Volume: 80.2% Epithelialization: Small (1-33%) Tunneling: No Undermining: No Wound Description Classification: Category/Stage III Wound Margin: Flat and Intact Exudate Amount: Medium Exudate Type: Serosanguineous Exudate Color: red, brown Foul Odor After Cleansing: No Slough/Fibrino Yes Wound Bed Granulation Amount: Large (67-100%) Exposed Structure Granulation Quality: Red, Friable Fascia Exposed: No Necrotic Amount: None Present (0%) Fat Layer (Subcutaneous Tissue) Exposed: Yes Tendon Exposed: No Muscle Exposed: No Joint Exposed: No Bone Exposed: No Treatment Notes Wound #12 (Upper Leg) Wound Laterality: Left, Proximal Cleanser Normal Saline Discharge Instruction: Wash your hands with soap and water. Remove old dressing, discard into plastic bag and place into trash. Cleanse the wound with Normal Saline prior to applying a  clean dressing using gauze sponges, not tissues or cotton balls. Do not scrub or use excessive force. Pat dry using gauze sponges, not tissue or cotton  balls. KARION, PRESTI ([id number]) Peri-Wound Care Topical Primary Dressing AquacelAg Advantage Dressing, 2X2 (in/in) Discharge Instruction: Apply to wound as directed Secondary Dressing ABD Pad 5x9 (in/in) Secured With 84M Medipore H Soft Cloth Surgical Tape, 2x2 (in/yd) Discharge Instruction: Secure dressing Compression Wrap Compression Stockings Add-Ons Electronic Signature(s) Signed: 01/06/2021 1:16:47 PM By: Donnamarie Poag Signed: 01/06/2021 4:40:35 PM By: Dolan Amen RN Entered By: Dolan Amen on 01/06/2021 11:23:19 Bradley [name] ([id number]) -------------------------------------------------------------------------------- [place] Details Patient Name: Bradley Loa A. Date of Service: 01/06/2021 10:45 AM Medical Record Number: [id number] Patient Account Number: [id number] Date of Birth/Sex: [birth year] (75 y.o. M) Treating RN: Dolan Amen Primary Care Cailey Trigueros: [name] Other Clinician: Referring Solomiya Pascale: [name] Treating Dayln Tugwell/Extender: Yaakov Guthrie in Treatment: 81 Vital Signs Time Taken: 11:03 Temperature (F): 98.2 Pulse (bpm): 64 Respiratory Rate (breaths/min): 18 Blood Pressure (mmHg): 129/60 Reference Range: 80 - 120 mg / dl Electronic Signature(s) Signed: 01/06/2021 1:16:47 PM By: Donnamarie Poag Signed: 01/06/2021 4:40:35 PM By: Dolan Amen RN Entered By: Dolan Amen on 01/06/2021 11:05:25

## 2021-01-10 DIAGNOSIS — L89154 Pressure ulcer of sacral region, stage 4: Secondary | ICD-10-CM | POA: Diagnosis not present

## 2021-01-10 DIAGNOSIS — M869 Osteomyelitis, unspecified: Secondary | ICD-10-CM | POA: Diagnosis not present

## 2021-01-10 DIAGNOSIS — G8220 Paraplegia, unspecified: Secondary | ICD-10-CM | POA: Diagnosis not present

## 2021-01-29 DIAGNOSIS — M869 Osteomyelitis, unspecified: Secondary | ICD-10-CM | POA: Diagnosis not present

## 2021-01-29 DIAGNOSIS — G8220 Paraplegia, unspecified: Secondary | ICD-10-CM | POA: Diagnosis not present

## 2021-01-29 DIAGNOSIS — L89154 Pressure ulcer of sacral region, stage 4: Secondary | ICD-10-CM | POA: Diagnosis not present

## 2021-02-10 DIAGNOSIS — L89154 Pressure ulcer of sacral region, stage 4: Secondary | ICD-10-CM | POA: Diagnosis not present

## 2021-02-10 DIAGNOSIS — M869 Osteomyelitis, unspecified: Secondary | ICD-10-CM | POA: Diagnosis not present

## 2021-02-10 DIAGNOSIS — G8220 Paraplegia, unspecified: Secondary | ICD-10-CM | POA: Diagnosis not present

## 2021-02-11 DIAGNOSIS — K631 Perforation of intestine (nontraumatic): Secondary | ICD-10-CM | POA: Diagnosis not present

## 2021-02-11 DIAGNOSIS — N319 Neuromuscular dysfunction of bladder, unspecified: Secondary | ICD-10-CM | POA: Diagnosis not present

## 2021-02-11 DIAGNOSIS — L89159 Pressure ulcer of sacral region, unspecified stage: Secondary | ICD-10-CM | POA: Diagnosis not present

## 2021-02-11 DIAGNOSIS — S31000A Unspecified open wound of lower back and pelvis without penetration into retroperitoneum, initial encounter: Secondary | ICD-10-CM | POA: Diagnosis not present

## 2021-02-11 DIAGNOSIS — Z933 Colostomy status: Secondary | ICD-10-CM | POA: Diagnosis not present

## 2021-02-11 DIAGNOSIS — S81809A Unspecified open wound, unspecified lower leg, initial encounter: Secondary | ICD-10-CM | POA: Diagnosis not present

## 2021-03-01 DIAGNOSIS — G8220 Paraplegia, unspecified: Secondary | ICD-10-CM | POA: Diagnosis not present

## 2021-03-01 DIAGNOSIS — L89154 Pressure ulcer of sacral region, stage 4: Secondary | ICD-10-CM | POA: Diagnosis not present

## 2021-03-01 DIAGNOSIS — M869 Osteomyelitis, unspecified: Secondary | ICD-10-CM | POA: Diagnosis not present

## 2021-03-10 ENCOUNTER — Encounter: Payer: Medicare HMO | Attending: Internal Medicine | Admitting: Internal Medicine

## 2021-03-10 ENCOUNTER — Other Ambulatory Visit: Payer: Self-pay

## 2021-03-10 DIAGNOSIS — G8220 Paraplegia, unspecified: Secondary | ICD-10-CM | POA: Diagnosis not present

## 2021-03-10 DIAGNOSIS — M8668 Other chronic osteomyelitis, other site: Secondary | ICD-10-CM | POA: Insufficient documentation

## 2021-03-10 DIAGNOSIS — I10 Essential (primary) hypertension: Secondary | ICD-10-CM | POA: Insufficient documentation

## 2021-03-10 DIAGNOSIS — L89154 Pressure ulcer of sacral region, stage 4: Secondary | ICD-10-CM | POA: Insufficient documentation

## 2021-03-10 DIAGNOSIS — L97128 Non-pressure chronic ulcer of left thigh with other specified severity: Secondary | ICD-10-CM | POA: Insufficient documentation

## 2021-03-10 DIAGNOSIS — Z87891 Personal history of nicotine dependence: Secondary | ICD-10-CM | POA: Diagnosis not present

## 2021-03-10 DIAGNOSIS — M069 Rheumatoid arthritis, unspecified: Secondary | ICD-10-CM | POA: Diagnosis not present

## 2021-03-11 DIAGNOSIS — L89159 Pressure ulcer of sacral region, unspecified stage: Secondary | ICD-10-CM | POA: Diagnosis not present

## 2021-03-11 DIAGNOSIS — S31000A Unspecified open wound of lower back and pelvis without penetration into retroperitoneum, initial encounter: Secondary | ICD-10-CM | POA: Diagnosis not present

## 2021-03-11 DIAGNOSIS — K631 Perforation of intestine (nontraumatic): Secondary | ICD-10-CM | POA: Diagnosis not present

## 2021-03-11 DIAGNOSIS — S81809A Unspecified open wound, unspecified lower leg, initial encounter: Secondary | ICD-10-CM | POA: Diagnosis not present

## 2021-03-11 DIAGNOSIS — Z933 Colostomy status: Secondary | ICD-10-CM | POA: Diagnosis not present

## 2021-03-11 DIAGNOSIS — N319 Neuromuscular dysfunction of bladder, unspecified: Secondary | ICD-10-CM | POA: Diagnosis not present

## 2021-03-11 NOTE — Progress Notes (Signed)
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 more than 2-3 hours at which time he transfers to bed and rotates lateral position. 05/10/16; Colbie Danner returns today for review of his stage III sacral pressure ulcer. He denies any concerns over the last 2 weeks although he seems to be running out of Aquacel Ag and on those days he uses Medihoney. He has [facility] was supplying  his dressings. He still complains of drainage. He does his dressings independently. He has in his motorized chair for 2-3 hours that time other than that he offloads this. Dimensions of the wound are down 1 cm in both directions. He underwent an aggressive debridement on his last visit of thick circumferential skin and subcutaneous tissue. It is possible at some point in the future he is going to need this done again 05/24/16; the patient returns today for review of his stage III sacral pressure ulcer. We have been using Aquacel Ag he tells me that he changes this up to twice a day. I'm not really certain of the reason for this frequency of changing. He has some involvement from the home  health nurses but I think is doing most of the changing himself which I think because of his paraplegia would be a very difficult exercise. Nevertheless he states that there is "wetness". I am not sure if there is another dressing that we could easily changed that much. I'd wanted to change to [facility] but I'll need to have a sense of how frequent he would need to change this. Bradley, [name] ([id number]) 06/14/16; this is a patient returns for review of his stage III sacral pressure ulcer. We have been using Aquacel Ag and over the last 2 visits he has had extensive debridement so of the thick circumferential skin and subcutaneous tissue that surrounds this wound. In spite of this really absolutely no change in the condition of the wound warrants measurements. We have [facility] home health I believe changing the dressing on 3 occasions the patient states he does this on one occasion himself 06/28/16; this is a patient who has a fairly large stage III sacral pressure ulcer. I changed him to [facility] from Aquacel 2 weeks ago. He returns today in follow-up. In the meantime a nurse from [facility] has calledrequesting ordering of a wound VAC. He had this discussion before. The problem is the proximity of  the lowest edge of this wound to the patient's anal opening roughly 3/4 of an inch. Can't see how this can be arranged. Apparently the nurse who is calling has a lot of experience, the question would be then when she is not available would be doing this. I would not have thought that this wound is not amenable to a wound VAC because of this reason 07/12/16; the patient comes in today and I have signed orders for a wound VAC. The home health team through [facility] is convinced that he can benefit from this even though there is close proximity to his anal opening beneath the gluteal clefts. The patient does not have a bowel regimen but states he has a bowel movement every 2 days this will also provide some problem with regards to the vac seal 07/26/16; the patient never did obtain a [place] wound VAC as he could not afford the $200 per month co-pay we have been using Hydrofera Blue now for 6 weeks or so. No major change in this wound at all. He is still not interested in the concept of plastic surgery. There changing the dressing every second day 08/09/16; the patient arrives with a wound precisely in the same situation. In keeping with the plan I outlined last time extensive debridement with an open curet the surface of this is not completely viable. Still has some degree of surrounding thick skin and subcutaneous tissue. No evidence of infection. Once again I have had a conversation with him about plastic surgery, he is simply not interested. 08/23/16; wound is really no different. Thick circumferential skin and subcutaneous tissue around the wound edge which is a lot better from debridement we did earlier in the year. The surface of the wound looks viable however with a curet there is definitely a gritty surface to this. We use Medihoney for a while, he could not afford Santyl. I don't think we could get a supply of Iodoflex. He talks a little more positively about the concept of plastic surgery which I've  gone over with him today 08/31/16;; patient arrives in clinic today with the wound surface really no different there is no changes in dimensions. I debrided today surface on the left upper side of  this wound aggressively week ago there is no real change here no evidence of epithelialization. The problem with debridement in the clinic is that he believes from this very liberally. We have been using Sorbact. 09/21/16; absolutely no change in the appearance or measurements of this wound. More recently I've been debrided in this aggressively and using sorbact to see if we could get to a better wound surface. Although this visually looks satisfactory, debridement reveals a very gritty surface to this. However even with this debridement and removal of thick nonviable skin and subcutaneous tissue from around the large amount of the circumference of this wound we have made absolutely no progress. This may be an offloading issue I'm just not completely certain. It has 2 close proximity in its inferior aspect to consider negative pressure therapy 10/26/16; READMISSION This patient called our clinic yesterday to report an odor in his wound. He had been to see plastic surgery at [facility] at our request after his last visit on 09/21/16; we have been seeing him for several months with a large stage III wound. He had been sent to general surgery for consideration of a colostomy, that appointment was not until mid June He comes in today with a temperature of 101. He is reporting an odor in the wound since last weekend. 01/10/17 Readmission: 01/10/17 On evaluation today it is noted that patient has been seen by plastic surgery at [facility] since he was last evaluated here. They did discuss with him the possibility of a flap according to the notes but unfortunately at this point he was not quite ready to proceed with surgery and instead wanted to give the Wound VAC a try. In the hospital they were able to get a good seal on the Wound  VAC. Unfortunately since that time they have been having trouble in regard to his current home health company keeping a simple on the Wound VAC. He would like to switch to a different home health company. With that being said it sounds as if the problem is that his wound VAC is not feeling at the lower portion of his back and he tells me that he can take some of the clear plastic and put over that area when the sill breaks and it will correct it for time. He has no discomfort or pain which is good news. He has been treated with IV vancomycin since he was last seen here and has an appointment with a infectious disease specialist in two days on 01/12/17. Otherwise he was transferred back to [place] for continuing to monitor and manage is wound as she progresses with a Wound VAC for the time being. 01/17/17 on evaluation today patient continues to show evidence of slight improvement with the Wound VAC fortunately there's no evidence of infection or otherwise worsening condition in general. Nonetheless we were unable to get him switch to [facility] in regard to home help from his current company. I'm not sure the reasoning behind but for some reason he was not accepted as a patient with him. Continue to apply the Wound VAC which does still show that some maceration around the wound edges but the wound measurements were slightly improved. No fevers, chills, nausea, or vomiting noted at this time. 02/14/17; this patient I have not seen in 5 months although he has been readmitted to our clinic seen by our physician assistant Jeri Cos twice in early August. I have looked through [facility] notes care everywhere. The patient saw plastic surgery in May [  Dr. Lynnell Jude. The patient was sent to general surgery and ultimately had a colostomy placed. On 11/29/16. This was after he was admitted to [facility] sometime in May. An MRI of the pelvis on 5/23 showed osteomyelitis of the coccyx. An attempt was made to drain fluid that was  not successful. He was treated with empiric broad-spectrum antibiotics VAC/cefepime/Flagyl starting on 11/02/16 with plans for a 6 week course. According to their notes he was sent to a nursing home. Was last seen by Dr. Myriam Jacobson of plastic surgery on 12/28/16. The first part of the note is a long dissertation about the difficulties finding adequate patients for flap closure of pressure ulcers. At that time the wound was noted to be stage IV based I think on underlying infection no exposed bone and healthy granulation tissue. 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 He was last seen by infectious disease 01/12/17 A Dr. Uvaldo Rising. His note says that Mr. Keng was not interested in a flap closure for referring a trial of the wound VAC. As previously anticipated the wound VAC could not be maintained as an outpatient in the community. He is now using something similar to a Dakin's wet to dry recommended by [facility] VASHE solution. He is placing this twice a day himself. This is almost s hopeless setting in terms of heeling 02/28/17; he is using a Dakin's wet to dry. Most of the wound surface looks satisfactory however the deeper area over his coccyx now has exposed bone I'm not sure if I noted this last week. 03/21/17; patient is usingVASHE solution wet to dry which I gather is a variation on Dakin solution. He has home health changing this 3 times a week the other days he does this himself. His appointment with plastic surgery 04/18/17; patient continues to use a variant of Dakin solution I believe. His wound continues to have a clean viable surface. The 2 areas of exposed bone in the center of this wound had closed over. He has an appointment with plastic surgery on December 5 at which time I hope that there'll be a plan for myocutaneous flap closure In looking through [facility] link I couldn't find any more plastic surgery appointments. I did come across the fact that  he is been followed by Bradley [name] ([id number]) hematology for a microcytic hypochromic anemia. He had a reasonably normal looking hemoglobin electrophoresis. His iron level was 10 and according to the patient he is going for IV iron infusions starting tomorrow. He had a sedimentation rate of 74. More problematically from a pure wound care point of view his albumin was 2.7 earlier this month 05/17/17; this is a patient I follow monthly. He has a large now stage IV wound over his bilateral buttocks with close proximity to his anal opening. More recently he has developed a large area with exposed bone in the center of this probably secondary to the underlying osteomyelitis E had in the summer. He also follows with Dr. Myriam Jacobson at [facility] who is plastic surgery. He had an appointment earlier this month and according to the patient Dr. Myriam Jacobson does not want to proceed with any attempted flap closure. Although I do not have current access to her note in care everywhere this is likely due to exposed bone. Again according to the patient they did a bone biopsy. He is still using a variant of Dakin solution changing twice a day. He has home health. The patient is not able  to give me a firm answer about how long he spends on this in his wheelchair The patient also states that Dr. Myriam Jacobson wanted to reconsider a wound VAC. I really don't see this as a viable option at least not in the outpatient setting. The wound itself is frankly to close to his anal opening to maintain a seal. The last time we tried to do this home health was unable to manage it. It might be possible to maintain a wound VAC in this setting outside of the home such as a skilled nursing facility or an [facility] however I am doubtful about this even in that setting **** READMISSION 09/21/17-He is here for evaluation of stage IV sacral ulcer. Since his last evaluation here in December he has completed treatment for sacral osteomyelitis. He was at  [facility] for IV therapy and NPWT dressing changes. He was discharged, with home health services, in February. He admits that while in the skilled facility he had "80%" success with maintaining dressing, since discharge he has had approximately "40%" success with maintaining wound VAC dressing. We discussed at length that this is not a safe or recommended option. We will apply Dakin's wet to dry dressing daily and he will follow-up next week. He is accompanied today by his sister who is willing to assist in dressing changes; they will discuss the social issue as he feels he is capable of changing dressing daily when home health is not able. 09/28/17-He is here in follow-up evaluation for stage IV sacral pressure ulcer. He has been using the Dakin's wet-to-dry daily; he continues with home health. He is not accompanied by anyone at this visit. He will follow up in two weeks per his request/preference. 10/12/17 on evaluation today patient appears to be doing very well. The Dakin solution went to dry packings do seem to be helping him as far as the sacral wound is concerned I'm not seeing anything that has me more concerned as far as infection or otherwise is concerned. Overall I'm pleased with the appearance of the wound. 10/26/17-He is here in follow up evaluation for a stage IV sacral ulcer. He continues with daily Dakin's wet-to-dry. He is voicing no complaints or concerns. He will follow-up in 2 weeks 11/16/17-He is here for follow up evaluation for a palliative stage IV sacral pressure ulcer. We will continue with Dakin's wet-to-dry. He will follow- up in 4 weeks. He is expressing concern/complaint regarding new bed that has arrived, stating he is unable to manipulate/maneuver it due to the bed crank being at the foot of the bed. 12/14/17-He is here for evaluation for palliative stage IV sacral ulcer. He is voicing no complaints or concerns. We will continue with one-to-one ratio of saline and  dakins. He will follow-up in 3 weeks 01/04/18-He is here in follow-up evaluation for palliative stage IV sacral ulcer. He is inquiring about reinstating the negative pressure wound therapy. We discussed at length that the negative pressure wound therapy in the home setting has not been successful for him repeatedly with loss of cereal and unavailability of 24/7 help; reminded him that home health is not available 24/7 when loss of seal occurs. He does verbalize understanding to this and does not pursue. We also discussed the palliative nature of this ulcer (given no significant change/improvement in measurement/appearance, not a candidate for muscle flap per plastic surgery, and continued independent living) and that the goal is for maintenance, decrease in infection and minimizing/avoiding deterioration given that he is independent in  his care, does not have home health and requires daily dressing changes secondary to drainage amount. He is inquiring about a wound clinic in [facility], I have informed him that I am unfamiliar with that clinic but that he is encouraged to seek another opinion if that is his desire. We will continue with dakins and he will follow up in three weeks 01/25/18-He is here in follow-up evaluation for palliative stage IV sacral ulcer. He continues with Dakin's/saline 1:1 mixture wet to dry dressing changes. He states he has an appointment at [facility] on 9/17 for evaluation of surgical intervention/closure of the sacral ulcer. He will follow-up here in 4 weeks Readmission: 07/16/2019 upon evaluation today patient appears to be doing really about the same as when he was previously seen here in the wound care center. He most recently was a patient of [facility] back when she was still working here in the center but had been referred to [facility] for consideration of a flap. With that being said the surgeon there at [facility] stated that this was too large for her flap and they have been attempting to get  this smaller in order to be able to proceed with a flap. Nonetheless unfortunately he has had a cycle of going back and forth between the osteomyelitis flaring and then sent him back and then making a little bit of progress only to be sent back again. It sounds like most recently they have been using a Iodoflex type dressing at this point which does not seem to have done any harm by any means. With that being said this wound seems to be quite large for using Iodoflex throughout and subsequently I think he may do much better with the use of Vioxx moistened gauze which would be safe for the new tissue growing and also keep the wound quite nice and clean. The patient is not opposed to this he in fact states that his home health nurse had mentioned this as a possibility as well. 07/23/2019 upon evaluation today patient actually appears to be doing very well in regard to his sacral wound. In fact this is much healthier the measurements not terribly different but again with a wound like this at home necessarily expect a a lot of change as far as the overall measurements are concerned in just 1 week's time. This is going be a much longer term process at this point. With that being said I do think that he is very healthy appearing as far as the base of the wound is concerned. 08/06/2019 upon evaluation today patient actually appears to be doing about the same with regard to his wound to be honest. There is really not a significant improvement overall based on what I am seeing today. Fortunately there is no signs of significant systemic infection. No fevers, chills, nausea, vomiting, or diarrhea. With that being said there is odor to the drainage from the wound and subsequently also what appears to be increased drainage based on what we are seeing today as well as what his home health nurse called [place] about that she was concerned with as well. 08/13/2019 upon evaluation today patient appears to be doing about the  same at this point with regard to his wound although I think the dressing may be a little less drainage wise compared to what it was previous. Fortunately there is no signs of active infection at this time. No fevers, chills, nausea, vomiting, or diarrhea. He has been taking the antibiotic for only 3 days.  08/23/2019 upon evaluation today patient appears to be doing not nearly as well with regard to his wound. In general really not making a lot of progress which is unfortunate. There does not appear to be any signs of active infection at this time. No fevers, chills, nausea, vomiting, or diarrhea. With that being said the patient unfortunately does seem to be experiencing continued epiboly around the edges of the wound as well as significant scar tissue he has been in the majority of his day sitting in his chair which is likely a big reason for all of this. Fortunately there is no Bradley, Jairen A. ([id number]) signs of active infection at this time. No fevers, chills, nausea, vomiting, or diarrhea. 08/29/2019 upon evaluation today patient's wound actually appears to be showing some signs of improvement. The region of few areas of new skin growth around the edges of the wound even where he has some of the epiboly. This is actually good news and I think that he has been very aggressive and offloading over the past week since we showed him the pictures of his wound last week. There still may be a chance that he is going require some sharp debridement to clear away some of these edges but to be honest I think that is can be quite an undertaking. The main reason is that he has a lot of thickened scar tissue and it is good to bleed quite significantly in my opinion. The I think if organ to do that we may even need to have a portable or disposable cautery in order to make sure that we are able to get the area completely sealed up as far as bleeding is concerned. I do have one that we can utilize. However being  that the wound looks so much better right now would like to give this 2 weeks to see how things stand and look at that point before making any additional recommendations. 09/12/2019 upon evaluation today patient appears to be making some progress as far as offloading is concerned. He still has a substantial wound but nonetheless he tells me he is off loading much more aggressively at this point. This is obviously good news. No fevers, chills, nausea, vomiting, or diarrhea. 4/22; I have not seen this patient in quite some time. No major change in the condition of the wound or its wound volume. Surrounding maceration of the skin around the edges of the wound. The wound is fairly substantial. To close to the anal opening to consider a wound VAC. He had extensive trials of plastic surgery at [facility] which really does not result in any improvement. His wound bed is however clean 10/10/2019 upon evaluation today patient appears to be doing well with regard to the wound in the sacral region. He has been tolerating the dressing changes without complication. Fortunately there is no signs of infection and he actually has some epithelial growth noted as well. He tells me has been trying to continue to offload this area which is excellent that is what he needs to do most as far as what he can have in his control. Otherwise I feel like the collagen with a saline moistened gauze behind has been beneficial for him. 5/20; collagen and normal saline wet-to-dry. Although the tissue when this large sacral wound looks reasonable there is been absolutely no benefit to the overall closure. He has macerated skin around this. He tells me that he spends a large part of the day up in the  wheelchair. He still has home health 11/07/2019 upon evaluation today patient appears to be doing well with regard to his wounds at this point. Fortunately there is no signs of active infection at this time. Overall I feel like his sacral  wound in general appears to be doing quite nicely. I am very pleased with overall how things are progressing and I feel like the patient is making excellent progress towards resolution. With that being said he still has a long ways to go before we expect to see this completely healed. 11/21/2019 upon evaluation today patient appears to be doing about the same in regard to his sacral wound. Unfortunately he is really not making much in the way of improvement when I questioned him about how much time he is really spending sitting on his bottom he tells me that the majority of his day is mainly in that position though he does spend some time in bed. It does not sound like it is enough however neither does it look like it is enough. 12/24/2019 upon evaluation today patient appears to be doing really about the same there is no significant improvement overall in his wound bed. Last time he was seen here we did have a discussion about the possibility of seeing if I can get a surgeon to evaluate and potentially clear away some of the thickened edges of the wound in order to allow this wound to potentially have a chance of closing with a wound VAC. Right now we could not even get a wound VAC to seal with some of the rolled and thickened edges. With that being said elected to the patient to think about this to discuss today. At this point the patient does want to see if I could make the referral to potentially see if the surgeon would be able to perform this and subsequently he agrees to go to a skilled nursing facility following if they are able to do this in order to allow for a wound VAC and to get this wound to heal more effectively and quickly. In my opinion the only way that I would recommend that he see the surgeon to clear away the edges would be if he is also in agreement with going to skilled nursing facility as I know he cannot appropriately offload at home to take care of this and that  setting. 01/07/2020 on evaluation today patient appears to be doing about the same in regard to his wound. There is no signs of active infection at this time which is great news. All in all he is really doing about the same. We have made referral to surgery to see if they can potentially perform debridement to clear away some of the scarred edging of the wound so we could potentially see about a wound VAC to try to help this heal more efficiently. Again we have discussed that if this occurs he would be in a skilled nursing facility following or release would need to be in order to appropriately offload. Nonetheless we are still in the process of working that referral. 01/21/2020 upon evaluation today patient appears to be doing about the same in regard to his sacral wound. He did see Dr. Lysle Pearl Dr. Ines Bloomer feeling was that there was not anything surgically he could offer at this point as he did not feel like that a wound VAC would likely be appropriate for the patient. With that being said he did recommend potential for trying to get the patient into a  skilled nursing facility as an outpatient and they will get me working on that as well. Fortunately there is no signs of active infection at this time. 11/10; this is a patient that has not been seen here in quite a bit of time while we were in our alternative venue. As well I have not seen this patient in probably over 2 years. As I remember things with him he has had this pressure ulcer encompassing his lower sacrum and coccyx and the surrounding buttocks for a prolonged period of time. When he first came here the surface looked fairly good and the wound was smaller we attempted a wound VAC but we could never keep this in place. Because of nonresponse of the wound to topical dressings we referred him to [facility] plastic surgery where preparation was being made for flap closure. Intact he had a colostomy however after that he was deemed to have 2  large wound for flap closure. He did have osteomyelitis in the coccyx area in 2019. He is using wet-to-dry he has home health. They are managing his ostomy supplies as well. The wound is substantially larger than what I remember. Versus last time he was here to wound months or so ago he has an unstageable wound on the left upper thigh posteriorly. He tells me he is in his chair quite a bit especially recently 12/1; patient comes back to clinic. He did not get the lab work done apparently home health would not do this they referred him to the lab where he is apparently having a CBC checked because he is on iron infusions. We will try to get the lab work I ordered done there. He also did not get the x-ray he says he does not remember the discussion. The issue is that this large wound has probing areas to bone. He may have underlying osteomyelitis again he was treated for this previously I think in 2019 12/15; the patient did have lab work done. Notable for iron deficiency anemia with a hemoglobin of 9.7 although he is following with hematology for this his CRP was 7.1 which is up from his previous value. sedimentation rate of 91 which is also higher. However he maintains a relatively high sedimentation rate. Most disturbingly was an albumin of 2.8 although this is also somewhat higher than the 2.2 we had previously. With some difficulty I think a home health nurse actually made him some Dakin's solution he is using a Dakin's wet-to-dry to the large sacral wound. He has the area on the left upper thigh as well Bradley, [name] ([id number]) 06/17/2020; I follow this patient on a monthly basis. 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 At one point he had underlying osteomyelitis here and he received antibiotic therapy. I believe this was done through [facility] We have been using palliative Dakin's solution wet-to-dry on this which is really This very large wound about the same with  healthy surface. No active infection that is obvious. He has developed an area on the left posterior thigh and now a mirror image area on the right posterior thigh noted pressure injuries from the wheelchair. This occurs even though he has a Roho cushion which is apparently new I have been forwarded lab work from 05/15/2020. At that point his hemoglobin was 9.2 down from 13.8 on 10/5 this is microcytic hypochromic. He has known iron deficiency although the exact cause of this is never really been clear to me.  He follows with hematology oncology and has been receiving IV iron he is supposed to follow-up in mid January and I told the patient this. From wound care point of view his albumin is 2.9 sedimentation rate is 74. His sedimentation rate has always been elevated in this range however his C-reactive protein was 82 which is way above anything previously measured. I see that on 05/08/2020 this was 7.1 I am not sure why the rapid discrepancy. He is supposed to have an x-ray of his lower sacrum looking for evidence of osteomyelitis that he was previously treated for. Is possible he would also require more advanced imaging such as another C CT or MRI He comes in also with paperwork for a Cl[place]ron air-fluidized bed with a trapeze bar. I asked him if he would be able to transfer in and out of this bed . I am not opposed to ordering it, but I do not want to make his transfers more difficult than they already are. He has a [employer] fast streamer 07/15/2020; he is managed to get his [place]ron air-fluidized bed. He has a new electric wheelchair with a wheelchair cushion. The x-ray I did showed some sacral bone destruction although this did not seem progressive him last time. He was treated for osteomyelitis in this area previously in 2018. His sedimentation rate is 91 CRP 7.1 albumin 2.8. We are using Dakin's wet-to-dry. He has [facility] home health 4/6; 7-month follow-up. He has a Cl[place]ron air-fluidized bed. He has the  wheelchair cushion. His major wound on the coccyx looks somewhat better including including both of [place] my nurse and my feeling that it is closed in somewhat. Wound is still too close to his anal opening to consider a wound VAC. At 1 point we were trying to get him into [facility] plastics to get a flap closure but for 1 reason or another this never moved forward I was never really certain what the issue was. He has an area on the left upper thigh in the gluteal fold. This is a clean wound. 6/1; 20-month follow-up. I follow this man on a palliative basis he has a chronic stage IV wound over the lower sacrum coccyx and surrounding buttocks. He has an air-fluidized bed and a wheelchair cushion but he spends more time in the wheelchair than he does off this area by his own admission. I do not think the area is really changed that much although it is probably deeper in 1 section. He does tell us that he was discharged from home health because of lack of change of the wound. This was [employer]. He was treated for underlying osteomyelitis several years ago and he may have underlying chronic osteomyelitis. I do not believe he ever got the x-ray I ordered 8/3; patient presents for his 53-month follow-up. He reports no issues or complaints today. He continues to use Dakin's wet-to-dry dressings to his sacrum and silver alginate to his left lateral thigh wound. He denies signs of infection. 10/5; patient presents for 23-month follow-up. He has no issues or complaints today. He continues to use Dakin's wet-to-dry dressings to his sacrum. He states that the left lateral thigh wound is healed. He denies signs of infection. Patient History Information obtained from Patient. Family History Cancer - Mother, Heart Disease - Mother,Father, Seizures - Child, No family history of Diabetes, Hereditary Spherocytosis, Hypertension, Kidney Disease, Lung Disease, Stroke, Thyroid Problems, Tuberculosis. Social History Former smoker,  Marital Status - Single, Alcohol Use - Never, Drug Use - No History,  Caffeine Use - Moderate - coffee. Medical History Eyes Denies history of Cataracts, Glaucoma, Optic Neuritis Hematologic/Lymphatic Patient has history of Anemia Denies history of Hemophilia, Human Immunodeficiency Virus, Lymphedema, Sickle Cell Disease Respiratory Denies history of Aspiration, Asthma, Chronic Obstructive Pulmonary Disease (COPD), Pneumothorax, Sleep Apnea, Tuberculosis Cardiovascular Patient has history of Hypertension Denies history of Angina, Arrhythmia, Congestive Heart Failure, Coronary Artery Disease, Deep Vein Thrombosis, Hypotension, Myocardial Infarction, Peripheral Arterial Disease, Peripheral Venous Disease, Phlebitis, Vasculitis Gastrointestinal Denies history of Cirrhosis , Colitis, Crohn s, Hepatitis A, Hepatitis B, Hepatitis C Endocrine Denies history of Type I Diabetes, Type II Diabetes Genitourinary Denies history of End Stage Renal Disease Immunological Denies history of Lupus Erythematosus, Raynaud s, Scleroderma Integumentary (Skin) Patient has history of History of pressure wounds Musculoskeletal Patient has history of Rheumatoid Arthritis Denies history of Gout, Osteoarthritis, Osteomyelitis Neurologic Patient has history of Paraplegia Denies history of Dementia, Neuropathy, Quadriplegia, Seizure Disorder Bradley, [name] ([id number]) Oncologic Denies history of Received Chemotherapy, Received Radiation Psychiatric Denies history of Anorexia/bulimia, Confinement Anxiety Hospitalization/Surgery History - December 2020 [facility]. Medical And Surgical History Notes Gastrointestinal colostomy Genitourinary frequent urination Objective Constitutional Vitals Time Taken: 10:58 AM, Temperature: 98.2 F, Pulse: 71 bpm, Respiratory Rate: 18 breaths/min, Blood Pressure: 122/55 mmHg. General Notes: Sacral ulcer: Granulation tissue throughout. Slight pale appearance. No probing to bone.  No obvious signs of infection. Integumentary (Hair, Skin) Wound #10 status is Open. Original cause of wound was Pressure Injury. The date acquired was: 06/07/2015. The wound has been in treatment 86 weeks. The wound is located on the Midline Sacrum. The wound measures 10cm length x 3.5cm width x 3cm depth; 27.489cm^2 area and 82.467cm^3 volume. There is bone and Fat Layer (Subcutaneous Tissue) exposed. There is no tunneling or undermining noted. There is a large amount of serosanguineous drainage noted. The wound margin is epibole. There is large (67-100%) red, pink granulation within the wound bed. There is a small (1-33%) amount of necrotic tissue within the wound bed including Adherent Slough. Wound #12 status is Open. Original cause of wound was Gradually Appeared. The date acquired was: 03/06/2020. The wound has been in treatment 47 weeks. The wound is located on the Left,Proximal Upper Leg. The wound measures 0cm length x 0cm width x 0cm depth; 0cm^2 area and 0cm^3 volume. There is no tunneling or undermining noted. There is a none present amount of drainage noted. The wound margin is flat and intact. There is no granulation within the wound bed. There is no necrotic tissue within the wound bed. 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 Patient follows for palliative wound care.Marland Kitchen He likely has chronic osteomyelitis. He is unable to stay off with this area well. He likes to follow-up every 2 months. I recommended continuing Dakin's wet-to-dry dressings. Also discussed the importance of offloading. No obvious signs of infection. Left thigh wound has healed. Plan Follow-up Appointments: Return Appointment in: - 2 months Bathing/ Shower/ Hygiene: May shower; gently cleanse wound with antibacterial  soap, rinse and pat dry prior to dressing wounds Off-Loading: Hospital bed/[place] ([id number]) Air fluidized (Group 3) Turn and reposition every 2 hours Additional Orders / Instructions: Follow Nutritious Diet and Increase Protein Intake WOUND #10: - Sacrum Wound Laterality: Midline Cleanser: Vashe 5.8 (oz) 1 x Per Day/30 Days Discharge Instructions: Use vashe 5.8 (oz) as directed-patient to self purchase Primary  Dressing: Gauze (DME) (Generic) 1 x Per Day/30 Days Discharge Instructions: Moistened with Vashe/Dakins and packed lightly into wound bed Secondary Dressing: ABD Pad 5x9 (in/in) (DME) (Generic) 1 x Per Day/30 Days Discharge Instructions: Cover with ABD pad Secondary Dressing: Xtrasorb Medium 4x5 (in/in) (DME) (Generic) 1 x Per Day/30 Days Discharge Instructions: Apply to wound as directed. Do not cut. Secured With: 16M Medipore H Soft Cloth Surgical Tape, 3x10 (in/yd) (Generic) 1 x Per Day/30 Days Discharge Instructions: Secure dressing 1. Continue Dakin's wet-to-dry 2. Aggressive offloading 3. Follow-up in 2 months Electronic Signature(s) Signed: 03/10/2021 11:32:13 AM By: Kalman Shan DO Entered By: Kalman Shan on 03/10/2021 11:31:43 Bradley [name] ([id number]) -------------------------------------------------------------------------------- ROS/PFSH Details Patient Name: Bradley Loa A. Date of Service: 03/10/2021 10:45 AM Medical Record Number: [id number] Patient Account Number: [id number] Date of Birth/Sex: 12-Nov-1945 (75 y.o. M) Treating RN: Dolan Amen Primary Care Provider: [name] Other Clinician: Referring Provider: [name] Treating Provider/Extender: Yaakov Guthrie in Treatment: 23 Label Progress Note Print Version as History and Physical for this encounter Information Obtained From Patient Eyes Medical History: Negative for: Cataracts; Glaucoma; Optic Neuritis Hematologic/Lymphatic Medical  History: Positive for: Anemia Negative for: Hemophilia; Human Immunodeficiency Virus; Lymphedema; Sickle Cell Disease Respiratory Medical History: Negative for: Aspiration; Asthma; Chronic Obstructive Pulmonary Disease (COPD); Pneumothorax; Sleep Apnea; Tuberculosis Cardiovascular Medical History: Positive for: Hypertension Negative for: Angina; Arrhythmia; Congestive Heart Failure; Coronary Artery Disease; Deep Vein Thrombosis; Hypotension; Myocardial Infarction; Peripheral Arterial Disease; Peripheral Venous Disease; Phlebitis; Vasculitis Gastrointestinal Medical History: Negative for: Cirrhosis ; Colitis; Crohnos; Hepatitis A; Hepatitis B; Hepatitis C Past Medical History Notes: colostomy Endocrine Medical History: Negative for: Type I Diabetes; Type II Diabetes Genitourinary Medical History: Negative for: End Stage Renal Disease Past Medical History Notes: frequent urination Immunological Medical History: Negative for: Lupus Erythematosus; Raynaudos; Scleroderma Integumentary (Skin) Medical History: Positive for: History of pressure wounds Musculoskeletal Medical History: Positive for: Rheumatoid Arthritis Bradley, KRYGIER A. ([id number]) Negative for: Gout; Osteoarthritis; Osteomyelitis Neurologic Medical History: Positive for: Paraplegia Negative for: Dementia; Neuropathy; Quadriplegia; Seizure Disorder Oncologic Medical History: Negative for: Received Chemotherapy; Received Radiation Psychiatric Medical History: Negative for: Anorexia/bulimia; Confinement Anxiety Immunizations Pneumococcal Vaccine: Received Pneumococcal Vaccination: No Immunization Notes: up to date Implantable Devices None Hospitalization / Surgery History Type of Hospitalization/Surgery December 2020 [facility] Family and Social History Cancer: Yes - Mother; Diabetes: No; Heart Disease: Yes - Mother,Father; Hereditary Spherocytosis: No; Hypertension: No; Kidney Disease: No; Lung Disease: No;  Seizures: Yes - Child; Stroke: No; Thyroid Problems: No; Tuberculosis: No; Former smoker; Marital Status - Single; Alcohol Use: Never; Drug Use: No History; Caffeine Use: Moderate - coffee; Financial Concerns: No; Food, Clothing or Shelter Needs: No; Support System Lacking: No; Transportation Concerns: No Electronic Signature(s) Signed: 03/10/2021 11:32:13 AM By: Kalman Shan DO Signed: 03/11/2021 1:42:44 PM By: Dolan Amen RN Entered By: Kalman Shan on 03/10/2021 11:29:18 Bradley [name] ([id number]) -------------------------------------------------------------------------------- SuperBill Details Patient Name: Bradley Loa A. Date of Service: 03/10/2021 Medical Record Number: [id number] Patient Account Number: [id number] Date of Birth/Sex: 1946-01-31 (75 y.o. 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 Facility Procedures CPT4 Code: 70623762 Description: 99213 - WOUND CARE VISIT-LEV 3 EST PT Modifier: Quantity: 1 Physician Procedures CPT4 Code: 8315176 Description:  99213 - WC PHYS LEVEL 3 - EST PT Modifier: Quantity: 1 CPT4 Code: Description: ICD-10 Diagnosis Description L89.154 Pressure ulcer of sacral region, stage 4 M86.68 Other chronic osteomyelitis, other site G82.20 Paraplegia, unspecified L97.128 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

## 2021-03-11 NOTE — Progress Notes (Signed)
DARVIS, CROFT ([id number]) Visit Report for 03/10/2021 Arrival Information Details Patient Name: Bradley [name], Bradley [name]. Date of Service: 03/10/2021 10:45 AM Medical Record Number: [id number] Patient Account Number: [id number] Date of Birth/Sex: 1945/10/24 (75 y.o. M) Treating RN: Dolan Amen Primary Care Aristotle Lieb: [name] Other Clinician: Referring Jeremiyah Cullens: [name] Treating Suzi Hernan/Extender: Yaakov Guthrie in Treatment: 62 Visit Information History Since Last Visit Added or deleted any medications: No Patient Arrived: Wheel Chair Had a fall or experienced change in No Arrival Time: 10:58 activities of daily living that may affect Accompanied By: self risk of falls: Transfer Assistance: None Hospitalized since last visit: No Patient Identification Verified: Yes Pain Present Now: No Secondary Verification Process Completed: Yes Patient Requires Transmission-Based Precautions: No Patient Has Alerts: No Electronic Signature(s) Signed: 03/11/2021 1:42:44 PM By: Dolan Amen RN Entered By: Dolan Amen on 03/10/2021 10:58:50 [name] ([id number]) -------------------------------------------------------------------------------- Clinic Level of Care Assessment Details Patient Name: Bradley Loa A. Date of Service: 03/10/2021 10:45 AM Medical Record Number: [id number] Patient Account Number: [id number] Date of Birth/Sex: 1946-05-28 (75 y.o. M) Treating RN: Dolan Amen Primary Care Lorrane Mccay: [name] Other Clinician: Referring Promiss Labarbera: [name] Treating Soua Lenk/Extender: Yaakov Guthrie in Treatment: 105 Clinic Level of Care Assessment Items TOOL 4 Quantity Score X - Use when only an EandM is performed on FOLLOW-UP visit 1 0 ASSESSMENTS - Nursing Assessment / Reassessment X - Reassessment of Co-morbidities (includes updates in patient status) 1 10 X- 1 5 Reassessment of Adherence to Treatment Plan ASSESSMENTS -  Wound and Skin Assessment / Reassessment X - Simple Wound Assessment / Reassessment - one wound 1 5 []  - 0 Complex Wound Assessment / Reassessment - multiple wounds []  - 0 Dermatologic / Skin Assessment (not related to wound area) ASSESSMENTS - Focused Assessment []  - Circumferential Edema Measurements - multi extremities 0 []  - 0 Nutritional Assessment / Counseling / Intervention []  - 0 Lower Extremity Assessment (monofilament, tuning fork, pulses) []  - 0 Peripheral Arterial Disease Assessment (using hand held doppler) ASSESSMENTS - Ostomy and/or Continence Assessment and Care []  - Incontinence Assessment and Management 0 []  - 0 Ostomy Care Assessment and Management (repouching, etc.) PROCESS - Coordination of Care X - Simple Patient / Family Education for ongoing care 1 15 []  - 0 Complex (extensive) Patient / Family Education for ongoing care []  - 0 Staff obtains Programmer, systems, Records, Test Results / Process Orders []  - 0 Staff telephones HHA, Nursing Homes / Clarify orders / etc []  - 0 Routine Transfer to another Facility (non-emergent condition) []  - 0 Routine Hospital Admission (non-emergent condition) []  - 0 New Admissions / Biomedical engineer / Ordering NPWT, Apligraf, etc. []  - 0 Emergency Hospital Admission (emergent condition) X- 1 10 Simple Discharge Coordination []  - 0 Complex (extensive) Discharge Coordination PROCESS - Special Needs []  - Pediatric / Minor Patient Management 0 []  - 0 Isolation Patient Management []  - 0 Hearing / Language / Visual special needs []  - 0 Assessment of Community assistance (transportation, D/C planning, etc.) []  - 0 Additional assistance / Altered mentation []  - 0 Support Surface(s) Assessment (bed, cushion, seat, etc.) INTERVENTIONS - Wound Cleansing / Measurement Bradley [name], Bradley A. ([id number]) X- 1 5 Simple Wound Cleansing - one wound []  - 0 Complex Wound Cleansing - multiple wounds X- 1 5 Wound Imaging (photographs  - any number of wounds) []  - 0 Wound Tracing (instead of photographs) X- 1 5 Simple Wound Measurement - one wound []  - 0 Complex Wound Measurement - multiple wounds INTERVENTIONS -  Wound Dressings []  - Small Wound Dressing one or multiple wounds 0 X- 1 15 Medium Wound Dressing one or multiple wounds []  - 0 Large Wound Dressing one or multiple wounds []  - 0 Application of Medications - topical []  - 0 Application of Medications - injection INTERVENTIONS - Miscellaneous []  - External ear exam 0 []  - 0 Specimen Collection (cultures, biopsies, blood, body fluids, etc.) []  - 0 Specimen(s) / Culture(s) sent or taken to Lab for analysis X- 1 10 Patient Transfer (multiple staff / Harrel Lemon Lift / Similar devices) []  - 0 Simple Staple / Suture removal (25 or less) []  - 0 Complex Staple / Suture removal (26 or more) []  - 0 Hypo / Hyperglycemic Management (close monitor of Blood Glucose) []  - 0 Ankle / Brachial Index (ABI) - do not check if billed separately X- 1 5 Vital Signs Has the patient been seen at the hospital within the last three years: Yes Total Score: 90 Level Of Care: New/Established - Level 3 Electronic Signature(s) Signed: 03/11/2021 1:42:44 PM By: Dolan Amen RN Entered By: Dolan Amen on 03/10/2021 11:26:26 [name] ([id number]) -------------------------------------------------------------------------------- Lower Extremity Assessment Details Patient Name: Bradley Loa A. Date of Service: 03/10/2021 10:45 AM Medical Record Number: [id number] Patient Account Number: [id number] Date of Birth/Sex: 06/04/1946 (75 y.o. M) Treating RN: Dolan Amen Primary Care Rochanda Harpham: [name] Other Clinician: Referring Lanelle Lindo: [name] Treating Leshawn Straka/Extender: Yaakov Guthrie in Treatment: 48 Electronic Signature(s) Signed: 03/11/2021 1:42:44 PM By: Dolan Amen RN Entered By: Dolan Amen on 03/10/2021 11:18:09 [name]  ([id number]) -------------------------------------------------------------------------------- Multi Wound Chart Details Patient Name: Bradley [name], Bradley A. Date of Service: 03/10/2021 10:45 AM Medical Record Number: [id number] Patient Account Number: [id number] Date of Birth/Sex: 04/27/1946 (75 y.o. M) Treating RN: Dolan Amen Primary Care Dilan Novosad: [name] Other Clinician: Referring Micha Erck: [name] Treating Roben Tatsch/Extender: Yaakov Guthrie in Treatment: 24 Vital Signs Height(in): Pulse(bpm): 8 Weight(lbs): Blood Pressure(mmHg): 122/55 Body Mass Index(BMI): Temperature(F): 98.2 Respiratory Rate(breaths/min): 18 Photos: [N/A:N/A] Wound Location: Midline Sacrum Left, Proximal Upper Leg N/A Wounding Event: Pressure Injury Gradually Appeared N/A Primary Etiology: Pressure Ulcer Pressure Ulcer N/A Comorbid History: Anemia, Hypertension, History of Anemia, Hypertension, History of N/A pressure wounds, Rheumatoid pressure wounds, Rheumatoid Arthritis, Paraplegia Arthritis, Paraplegia Date Acquired: 06/07/2015 03/06/2020 N/A Weeks of Treatment: 86 47 N/A Wound Status: Open Open N/A Measurements L x W x D (cm) 10x3.5x3 0x0x0 N/A Area (cm) : 27.489 0 N/A Volume (cm) : 82.467 0 N/A % Reduction in Area: 46.60% 100.00% N/A % Reduction in Volume: 73.30% 100.00% N/A Classification: Category/Stage IV Category/Stage III N/A Exudate Amount: Large None Present N/A Exudate Type: Serosanguineous N/A N/A Exudate Color: red, brown N/A N/A Wound Margin: Epibole Flat and Intact N/A Granulation Amount: Large (67-100%) None Present (0%) N/A Granulation Quality: Red, Pink N/A N/A Necrotic Amount: Small (1-33%) None Present (0%) N/A Exposed Structures: Fat Layer (Subcutaneous Tissue): Fascia: No N/A Yes Fat Layer (Subcutaneous Tissue): Bone: Yes No Fascia: No Tendon: No Tendon: No Muscle: No Muscle: No Joint: No Joint: No Bone: No Epithelialization: None Large  (67-100%) N/A Treatment Notes Electronic Signature(s) Signed: 03/10/2021 11:32:13 AM By: Kalman Shan DO Entered By: Kalman Shan on 03/10/2021 11:28:27 [name] ([id number]) -------------------------------------------------------------------------------- [employer] Details Patient Name: Bradley Loa A. Date of Service: 03/10/2021 10:45 AM Medical Record Number: [id number] Patient Account Number: [id number] Date of Birth/Sex: September 28, 1945 (75 y.o. M) Treating RN: Dolan Amen Primary Care Micha Dosanjh: [name] Other Clinician: Referring Kache Mcclurg: [name] Treating Thad Osoria/Extender:  Kalman Shan Weeks in Treatment: 71 Active Inactive Electronic Signature(s) Signed: 03/11/2021 1:42:44 PM By: Dolan Amen RN Entered By: Dolan Amen on 03/10/2021 11:24:44 [name] ([id number]) -------------------------------------------------------------------------------- Pain Assessment Details Patient Name: Bradley [name], Bradley A. Date of Service: 03/10/2021 10:45 AM Medical Record Number: [id number] Patient Account Number: [id number] Date of Birth/Sex: [birth year] (75 y.o. M) Treating RN: Dolan Amen Primary Care Earlean Fidalgo: [name] Other Clinician: Referring Maymie Brunke: [name] Treating Jevaughn Degollado/Extender: Yaakov Guthrie in Treatment: 60 Active Problems Location of Pain Severity and Description of Pain Patient Has Paino No Site Locations Pain Management and Medication Current Pain Management: Electronic Signature(s) Signed: 03/11/2021 1:42:44 PM By: Dolan Amen RN Entered By: Dolan Amen on 03/10/2021 10:59:13 [name] ([id number]) -------------------------------------------------------------------------------- Wound Assessment Details Patient Name: Bradley Loa A. Date of Service: 03/10/2021 10:45 AM Medical Record Number: [id number] Patient Account Number: [id number] Date of Birth/Sex:  [birth year] (75 y.o. M) Treating RN: Dolan Amen Primary Care Amberlyn Martinezgarcia: [name] Other Clinician: Referring Ursala Cressy: [name] Treating Roben Tatsch/Extender: Yaakov Guthrie in Treatment: 59 Wound Status Wound Number: 10 Primary Pressure Ulcer Etiology: Wound Location: Midline Sacrum Wound Open Wounding Event: Pressure Injury Status: Date Acquired: 06/07/2015 Comorbid Anemia, Hypertension, History of pressure wounds, Weeks Of Treatment: 86 History: Rheumatoid Arthritis, Paraplegia Clustered Wound: No Photos Wound Measurements Length: (cm) 10 Width: (cm) 3.5 Depth: (cm) 3 Area: (cm) 27.489 Volume: (cm) 82.467 % Reduction in Area: 46.6% % Reduction in Volume: 73.3% Epithelialization: None Tunneling: No Undermining: No Wound Description Classification: Category/Stage IV Wound Margin: Epibole Exudate Amount: Large Exudate Type: Serosanguineous Exudate Color: red, brown Foul Odor After Cleansing: No Slough/Fibrino Yes Wound Bed Granulation Amount: Large (67-100%) Exposed Structure Granulation Quality: Red, Pink Fascia Exposed: No Necrotic Amount: Small (1-33%) Fat Layer (Subcutaneous Tissue) Exposed: Yes Necrotic Quality: Adherent Slough Tendon Exposed: No Muscle Exposed: No Joint Exposed: No Bone Exposed: Yes Electronic Signature(s) Signed: 03/11/2021 1:42:44 PM By: Dolan Amen RN Entered By: Dolan Amen on 03/10/2021 11:17:00 [name] ([id number]) -------------------------------------------------------------------------------- Wound Assessment Details Patient Name: Bradley Loa A. Date of Service: 03/10/2021 10:45 AM Medical Record Number: [id number] Patient Account Number: [id number] Date of Birth/Sex: 1946/05/20 (75 y.o. M) Treating RN: Dolan Amen Primary Care Collette Pescador: [name] Other Clinician: Referring Shadiamond Koska: [name] Treating Shaney Deckman/Extender: Yaakov Guthrie in Treatment: 19 Wound  Status Wound Number: 12 Primary Pressure Ulcer Etiology: Wound Location: Left, Proximal Upper Leg Wound Open Wounding Event: Gradually Appeared Status: Date Acquired: 03/06/2020 Comorbid Anemia, Hypertension, History of pressure wounds, Weeks Of Treatment: 47 History: Rheumatoid Arthritis, Paraplegia Clustered Wound: No Photos Wound Measurements Length: (cm) 0 Width: (cm) 0 Depth: (cm) 0 Area: (cm) 0 Volume: (cm) 0 % Reduction in Area: 100% % Reduction in Volume: 100% Epithelialization: Large (67-100%) Tunneling: No Undermining: No Wound Description Classification: Category/Stage III Wound Margin: Flat and Intact Exudate Amount: None Present Foul Odor After Cleansing: No Slough/Fibrino No Wound Bed Granulation Amount: None Present (0%) Exposed Structure Necrotic Amount: None Present (0%) Fascia Exposed: No Fat Layer (Subcutaneous Tissue) Exposed: No Tendon Exposed: No Muscle Exposed: No Joint Exposed: No Bone Exposed: No Electronic Signature(s) Signed: 03/11/2021 1:42:44 PM By: Dolan Amen RN Entered By: Dolan Amen on 03/10/2021 [place], [place] ([id number]) -------------------------------------------------------------------------------- [place] Details Patient Name: Bradley Loa A. Date of Service: 03/10/2021 10:45 AM Medical Record Number: [id number] Patient Account Number: [id number] Date of Birth/Sex: 10/03/1945 (75 y.o. M) Treating RN: Dolan Amen Primary Care Shanetha Bradham: [name] Other Clinician: Referring Jareth Pardee: [name] Treating Frederic Tones/Extender: Yaakov Guthrie  in Treatment: 86 Vital Signs Time Taken: 10:58 Temperature (F): 98.2 Pulse (bpm): 71 Respiratory Rate (breaths/min): 18 Blood Pressure (mmHg): 122/55 Reference Range: 80 - 120 mg / dl Electronic Signature(s) Signed: 03/11/2021 1:42:44 PM By: Dolan Amen RN Entered By: Dolan Amen on 03/10/2021 10:59:07

## 2021-03-12 DIAGNOSIS — G8220 Paraplegia, unspecified: Secondary | ICD-10-CM | POA: Diagnosis not present

## 2021-03-12 DIAGNOSIS — M869 Osteomyelitis, unspecified: Secondary | ICD-10-CM | POA: Diagnosis not present

## 2021-03-12 DIAGNOSIS — L89154 Pressure ulcer of sacral region, stage 4: Secondary | ICD-10-CM | POA: Diagnosis not present

## 2021-03-13 DIAGNOSIS — S31000A Unspecified open wound of lower back and pelvis without penetration into retroperitoneum, initial encounter: Secondary | ICD-10-CM | POA: Diagnosis not present

## 2021-03-13 DIAGNOSIS — S81809A Unspecified open wound, unspecified lower leg, initial encounter: Secondary | ICD-10-CM | POA: Diagnosis not present

## 2021-03-13 DIAGNOSIS — K631 Perforation of intestine (nontraumatic): Secondary | ICD-10-CM | POA: Diagnosis not present

## 2021-03-13 DIAGNOSIS — N319 Neuromuscular dysfunction of bladder, unspecified: Secondary | ICD-10-CM | POA: Diagnosis not present

## 2021-03-13 DIAGNOSIS — L89159 Pressure ulcer of sacral region, unspecified stage: Secondary | ICD-10-CM | POA: Diagnosis not present

## 2021-03-13 DIAGNOSIS — Z933 Colostomy status: Secondary | ICD-10-CM | POA: Diagnosis not present

## 2021-03-15 DIAGNOSIS — N319 Neuromuscular dysfunction of bladder, unspecified: Secondary | ICD-10-CM | POA: Diagnosis not present

## 2021-03-15 DIAGNOSIS — S31000A Unspecified open wound of lower back and pelvis without penetration into retroperitoneum, initial encounter: Secondary | ICD-10-CM | POA: Diagnosis not present

## 2021-03-15 DIAGNOSIS — K631 Perforation of intestine (nontraumatic): Secondary | ICD-10-CM | POA: Diagnosis not present

## 2021-03-15 DIAGNOSIS — L89159 Pressure ulcer of sacral region, unspecified stage: Secondary | ICD-10-CM | POA: Diagnosis not present

## 2021-03-15 DIAGNOSIS — Z933 Colostomy status: Secondary | ICD-10-CM | POA: Diagnosis not present

## 2021-03-15 DIAGNOSIS — S81809A Unspecified open wound, unspecified lower leg, initial encounter: Secondary | ICD-10-CM | POA: Diagnosis not present

## 2021-03-18 ENCOUNTER — Telehealth: Payer: Self-pay | Admitting: Urology
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
## 2021-03-19 ENCOUNTER — Encounter: Payer: Self-pay | Admitting: Urology

## 2021-03-19 ENCOUNTER — Other Ambulatory Visit: Payer: Self-pay

## 2021-03-19 ENCOUNTER — Ambulatory Visit (INDEPENDENT_AMBULATORY_CARE_PROVIDER_SITE_OTHER): Payer: Medicare HMO | Admitting: Urology

## 2021-03-19 VITALS — BP 122/72 | HR 88 | Ht 73.0 in

## 2021-03-19 DIAGNOSIS — N319 Neuromuscular dysfunction of bladder, unspecified: Secondary | ICD-10-CM

## 2021-03-19 DIAGNOSIS — E349 Endocrine disorder, unspecified: Secondary | ICD-10-CM

## 2021-03-19 DIAGNOSIS — R972 Elevated prostate specific antigen [PSA]: Secondary | ICD-10-CM

## 2021-03-20 DIAGNOSIS — L89159 Pressure ulcer of sacral region, unspecified stage: Secondary | ICD-10-CM | POA: Diagnosis not present

## 2021-03-20 DIAGNOSIS — N319 Neuromuscular dysfunction of bladder, unspecified: Secondary | ICD-10-CM | POA: Diagnosis not present

## 2021-03-20 DIAGNOSIS — Z933 Colostomy status: Secondary | ICD-10-CM | POA: Diagnosis not present

## 2021-03-20 DIAGNOSIS — S31000A Unspecified open wound of lower back and pelvis without penetration into retroperitoneum, initial encounter: Secondary | ICD-10-CM | POA: Diagnosis not present

## 2021-03-20 DIAGNOSIS — S81809A Unspecified open wound, unspecified lower leg, initial encounter: Secondary | ICD-10-CM | POA: Diagnosis not present

## 2021-03-20 DIAGNOSIS — K631 Perforation of intestine (nontraumatic): Secondary | ICD-10-CM | POA: Diagnosis not present

## 2021-03-23 DIAGNOSIS — S31000A Unspecified open wound of lower back and pelvis without penetration into retroperitoneum, initial encounter: Secondary | ICD-10-CM | POA: Diagnosis not present

## 2021-03-23 DIAGNOSIS — K631 Perforation of intestine (nontraumatic): Secondary | ICD-10-CM | POA: Diagnosis not present

## 2021-03-23 DIAGNOSIS — N319 Neuromuscular dysfunction of bladder, unspecified: Secondary | ICD-10-CM | POA: Diagnosis not present

## 2021-03-23 DIAGNOSIS — S81809A Unspecified open wound, unspecified lower leg, initial encounter: Secondary | ICD-10-CM | POA: Diagnosis not present

## 2021-03-23 DIAGNOSIS — Z933 Colostomy status: Secondary | ICD-10-CM | POA: Diagnosis not present

## 2021-03-23 DIAGNOSIS — L89159 Pressure ulcer of sacral region, unspecified stage: Secondary | ICD-10-CM | POA: Diagnosis not present

## 2021-03-25 DIAGNOSIS — S81809A Unspecified open wound, unspecified lower leg, initial encounter: Secondary | ICD-10-CM | POA: Diagnosis not present

## 2021-03-25 DIAGNOSIS — Z933 Colostomy status: Secondary | ICD-10-CM | POA: Diagnosis not present

## 2021-03-25 DIAGNOSIS — S31000A Unspecified open wound of lower back and pelvis without penetration into retroperitoneum, initial encounter: Secondary | ICD-10-CM | POA: Diagnosis not present

## 2021-03-25 DIAGNOSIS — N319 Neuromuscular dysfunction of bladder, unspecified: Secondary | ICD-10-CM | POA: Diagnosis not present

## 2021-03-25 DIAGNOSIS — L89159 Pressure ulcer of sacral region, unspecified stage: Secondary | ICD-10-CM | POA: Diagnosis not present

## 2021-03-25 DIAGNOSIS — K631 Perforation of intestine (nontraumatic): Secondary | ICD-10-CM | POA: Diagnosis not present

## 2021-03-31 DIAGNOSIS — G8220 Paraplegia, unspecified: Secondary | ICD-10-CM | POA: Diagnosis not present

## 2021-03-31 DIAGNOSIS — L89154 Pressure ulcer of sacral region, stage 4: Secondary | ICD-10-CM | POA: Diagnosis not present

## 2021-03-31 DIAGNOSIS — M869 Osteomyelitis, unspecified: Secondary | ICD-10-CM | POA: Diagnosis not present

## 2021-04-12 DIAGNOSIS — G8220 Paraplegia, unspecified: Secondary | ICD-10-CM | POA: Diagnosis not present

## 2021-04-12 DIAGNOSIS — R252 Cramp and spasm: Secondary | ICD-10-CM | POA: Diagnosis not present

## 2021-04-12 DIAGNOSIS — L89154 Pressure ulcer of sacral region, stage 4: Secondary | ICD-10-CM | POA: Diagnosis not present

## 2021-04-12 DIAGNOSIS — M869 Osteomyelitis, unspecified: Secondary | ICD-10-CM | POA: Diagnosis not present

## 2021-05-12 ENCOUNTER — Other Ambulatory Visit: Payer: Self-pay | Admitting: *Deleted

## 2021-05-12 ENCOUNTER — Encounter: Payer: Medicare HMO | Attending: Internal Medicine | Admitting: Internal Medicine

## 2021-05-12 ENCOUNTER — Other Ambulatory Visit: Payer: Self-pay

## 2021-05-12 DIAGNOSIS — L89154 Pressure ulcer of sacral region, stage 4: Secondary | ICD-10-CM | POA: Insufficient documentation

## 2021-05-12 DIAGNOSIS — M8668 Other chronic osteomyelitis, other site: Secondary | ICD-10-CM

## 2021-05-12 DIAGNOSIS — L89153 Pressure ulcer of sacral region, stage 3: Secondary | ICD-10-CM | POA: Diagnosis not present

## 2021-05-12 DIAGNOSIS — G8220 Paraplegia, unspecified: Secondary | ICD-10-CM | POA: Insufficient documentation

## 2021-05-12 DIAGNOSIS — D508 Other iron deficiency anemias: Secondary | ICD-10-CM

## 2021-05-12 DIAGNOSIS — L97128 Non-pressure chronic ulcer of left thigh with other specified severity: Secondary | ICD-10-CM | POA: Diagnosis not present

## 2021-05-12 DIAGNOSIS — M869 Osteomyelitis, unspecified: Secondary | ICD-10-CM | POA: Diagnosis not present

## 2021-05-17 ENCOUNTER — Other Ambulatory Visit: Payer: Self-pay

## 2021-05-17 ENCOUNTER — Inpatient Hospital Stay: Payer: Medicare HMO | Attending: Nurse Practitioner

## 2021-05-17 DIAGNOSIS — D509 Iron deficiency anemia, unspecified: Secondary | ICD-10-CM | POA: Diagnosis not present

## 2021-05-17 DIAGNOSIS — D508 Other iron deficiency anemias: Secondary | ICD-10-CM

## 2021-05-17 LAB — CBC WITH DIFFERENTIAL/PLATELET
Abs Immature Granulocytes: 0.03 10*3/uL (ref 0.00–0.07)
Basophils Absolute: 0 10*3/uL (ref 0.0–0.1)
Basophils Relative: 0 %
Eosinophils Absolute: 0.2 10*3/uL (ref 0.0–0.5)
Eosinophils Relative: 2 %
HCT: 37.4 % — ABNORMAL LOW (ref 39.0–52.0)
Hemoglobin: 11.4 g/dL — ABNORMAL LOW (ref 13.0–17.0)
Immature Granulocytes: 0 %
Lymphocytes Relative: 26 %
Lymphs Abs: 2.3 10*3/uL (ref 0.7–4.0)
MCH: 23.8 pg — ABNORMAL LOW (ref 26.0–34.0)
MCHC: 30.5 g/dL (ref 30.0–36.0)
MCV: 77.9 fL — ABNORMAL LOW (ref 80.0–100.0)
Monocytes Absolute: 0.7 10*3/uL (ref 0.1–1.0)
Monocytes Relative: 8 %
Neutro Abs: 5.6 10*3/uL (ref 1.7–7.7)
Neutrophils Relative %: 64 %
Platelets: 295 10*3/uL (ref 150–400)
RBC: 4.8 MIL/uL (ref 4.22–5.81)
RDW: 16.8 % — ABNORMAL HIGH (ref 11.5–15.5)
WBC: 8.8 10*3/uL (ref 4.0–10.5)
nRBC: 0 % (ref 0.0–0.2)

## 2021-05-17 LAB — IRON AND TIBC
Iron: 24 ug/dL — ABNORMAL LOW (ref 45–182)
Saturation Ratios: 9 % — ABNORMAL LOW (ref 17.9–39.5)
TIBC: 265 ug/dL (ref 250–450)
UIBC: 241 ug/dL

## 2021-05-17 LAB — FERRITIN: Ferritin: 138 ng/mL (ref 24–336)

## 2021-05-17 NOTE — Progress Notes (Signed)
MIKAH, POSS ([id number]) Visit Report for 05/12/2021 Arrival Information Details Patient Name: ONYEKACHI, GATHRIGHT. Date of Service: 05/12/2021 10:30 AM Medical Record Number: [id number] Patient Account Number: [id number] Date of Birth/Sex: 1945/06/16 (75 y.o. M) Treating RN: Levora Dredge Primary Care Adaijah Endres: [name] Other Clinician: Cornell Barman Referring Naketa Daddario: [name] Treating Jandi Swiger/Extender: Yaakov Guthrie in Treatment: 95 Visit Information History Since Last Visit Added or deleted any medications: No Patient Arrived: Wheel Chair Any new allergies or adverse reactions: No Arrival Time: 10:43 Had a fall or experienced change in No Accompanied By: self activities of daily living that may affect Transfer Assistance: Harrel Lemon Lift risk of falls: Patient Identification Verified: Yes Hospitalized since last visit: No Secondary Verification Process Completed: Yes Pain Present Now: No Patient Requires Transmission-Based Precautions: No Patient Has Alerts: No Electronic Signature(s) Signed: 05/17/2021 11:01:31 AM By: Levora Dredge Entered By: Levora Dredge on 05/12/2021 10:51:15 [name] ([id number]) -------------------------------------------------------------------------------- Clinic Level of Care Assessment Details Patient Name: [name] Date of Service: 05/12/2021 10:30 AM Medical Record Number: [id number] Patient Account Number: [id number] Date of Birth/Sex: 1945/11/26 (75 y.o. M) Treating RN: Levora Dredge Primary Care Paysley Poplar: [name] Other Clinician: Cornell Barman Referring Tija Biss: [name] Treating Preston Weill/Extender: Yaakov Guthrie in Treatment: 95 Clinic Level of Care Assessment Items TOOL 4 Quantity Score []  - Use when only an EandM is performed on FOLLOW-UP visit 0 ASSESSMENTS - Nursing Assessment / Reassessment X - Reassessment of Co-morbidities (includes updates in patient status)  1 10 X- 1 5 Reassessment of Adherence to Treatment Plan ASSESSMENTS - Wound and Skin Assessment / Reassessment X - Simple Wound Assessment / Reassessment - one wound 1 5 []  - 0 Complex Wound Assessment / Reassessment - multiple wounds []  - 0 Dermatologic / Skin Assessment (not related to wound area) ASSESSMENTS - Focused Assessment []  - Circumferential Edema Measurements - multi extremities 0 []  - 0 Nutritional Assessment / Counseling / Intervention []  - 0 Lower Extremity Assessment (monofilament, tuning fork, pulses) []  - 0 Peripheral Arterial Disease Assessment (using hand held doppler) ASSESSMENTS - Ostomy and/or Continence Assessment and Care []  - Incontinence Assessment and Management 0 []  - 0 Ostomy Care Assessment and Management (repouching, etc.) PROCESS - Coordination of Care X - Simple Patient / Family Education for ongoing care 1 15 []  - 0 Complex (extensive) Patient / Family Education for ongoing care X- 1 10 Staff obtains Programmer, systems, Records, Test Results / Process Orders []  - 0 Staff telephones HHA, Nursing Homes / Clarify orders / etc []  - 0 Routine Transfer to another Facility (non-emergent condition) []  - 0 Routine Hospital Admission (non-emergent condition) []  - 0 New Admissions / Biomedical engineer / Ordering NPWT, Apligraf, etc. []  - 0 Emergency Hospital Admission (emergent condition) X- 1 10 Simple Discharge Coordination []  - 0 Complex (extensive) Discharge Coordination PROCESS - Special Needs []  - Pediatric / Minor Patient Management 0 []  - 0 Isolation Patient Management []  - 0 Hearing / Language / Visual special needs []  - 0 Assessment of Community assistance (transportation, D/C planning, etc.) []  - 0 Additional assistance / Altered mentation []  - 0 Support Surface(s) Assessment (bed, cushion, seat, etc.) INTERVENTIONS - Wound Cleansing / Measurement HUDSYN, CHAMPINE A. ([id number]) X- 1 5 Simple Wound Cleansing - one wound []  -  0 Complex Wound Cleansing - multiple wounds X- 1 5 Wound Imaging (photographs - any number of wounds) []  - 0 Wound Tracing (instead of photographs) X- 1 5 Simple Wound Measurement - one wound []  -  0 Complex Wound Measurement - multiple wounds INTERVENTIONS - Wound Dressings []  - Small Wound Dressing one or multiple wounds 0 X- 1 15 Medium Wound Dressing one or multiple wounds []  - 0 Large Wound Dressing one or multiple wounds []  - 0 Application of Medications - topical []  - 0 Application of Medications - injection INTERVENTIONS - Miscellaneous []  - External ear exam 0 []  - 0 Specimen Collection (cultures, biopsies, blood, body fluids, etc.) []  - 0 Specimen(s) / Culture(s) sent or taken to Lab for analysis X- 1 10 Patient Transfer (multiple staff / Harrel Lemon Lift / Similar devices) []  - 0 Simple Staple / Suture removal (25 or less) []  - 0 Complex Staple / Suture removal (26 or more) []  - 0 Hypo / Hyperglycemic Management (close monitor of Blood Glucose) []  - 0 Ankle / Brachial Index (ABI) - do not check if billed separately X- 1 5 Vital Signs Has the patient been seen at the hospital within the last three years: Yes Total Score: 100 Level Of Care: New/Established - Level 3 Electronic Signature(s) Signed: 05/17/2021 11:01:31 AM By: Levora Dredge Entered By: Levora Dredge on 05/12/2021 11:53:15 [name] ([id number]) -------------------------------------------------------------------------------- Encounter Discharge Information Details Patient Name: [name] Date of Service: 05/12/2021 10:30 AM Medical Record Number: [id number] Patient Account Number: [id number] Date of Birth/Sex: 01/16/1946 (75 y.o. M) Treating RN: Levora Dredge Primary Care Brehanna Deveny: [name] Other Clinician: Cornell Barman Referring Gabrella Stroh: [name] Treating Terrion Gencarelli/Extender: Yaakov Guthrie in Treatment: 95 Encounter Discharge Information  Items Discharge Condition: Stable Ambulatory Status: Ambulatory Discharge Destination: Home Transportation: Private Auto Accompanied By: self Schedule Follow-up Appointment: Yes Clinical Summary of Care: Electronic Signature(s) Signed: 05/17/2021 11:01:31 AM By: Levora Dredge Entered By: Levora Dredge on 05/12/2021 11:54:51 [name] ([id number]) -------------------------------------------------------------------------------- Lower Extremity Assessment Details Patient Name: [name] Date of Service: 05/12/2021 10:30 AM Medical Record Number: [id number] Patient Account Number: [id number] Date of Birth/Sex: 1945-11-18 (75 y.o. M) Treating RN: Levora Dredge Primary Care Jakaiya Netherland: [name] Other Clinician: Cornell Barman Referring Jhony Antrim: [name] Treating Lark Runk/Extender: Yaakov Guthrie in Treatment: 95 Electronic Signature(s) Signed: 05/17/2021 11:01:31 AM By: Levora Dredge Entered By: Levora Dredge on 05/12/2021 11:00:57 [name] ([id number]) -------------------------------------------------------------------------------- Multi Wound Chart Details Patient Name: TREVYN, LUMPKIN A. Date of Service: 05/12/2021 10:30 AM Medical Record Number: [id number] Patient Account Number: [id number] Date of Birth/Sex: [birth year] (75 y.o. M) Treating RN: Levora Dredge Primary Care Rei Contee: [name] Other Clinician: Cornell Barman Referring Larene Ascencio: [name] Treating Tionne Carelli/Extender: Yaakov Guthrie in Treatment: 95 Vital Signs Height(in): Pulse(bpm): 100 Weight(lbs): Blood Pressure(mmHg): 102/54 Body Mass Index(BMI): Temperature(F): 98.4 Respiratory Rate(breaths/min): 18 Photos: [N/A:N/A] Wound Location: Midline Sacrum N/A N/A Wounding Event: Pressure Injury N/A N/A Primary Etiology: Pressure Ulcer N/A N/A Comorbid History: Anemia, Hypertension, History of N/A N/A pressure wounds,  Rheumatoid Arthritis, Paraplegia Date Acquired: 06/07/2015 N/A N/A Weeks of Treatment: 95 N/A N/A Wound Status: Open N/A N/A Measurements L x W x D (cm) 10.5x3.5x3.2 N/A N/A Area (cm) : 28.863 N/A N/A Volume (cm) : 92.363 N/A N/A % Reduction in Area: 44.00% N/A N/A % Reduction in Volume: 70.10% N/A N/A Starting Position 1 (o'clock): 6 Ending Position 1 (o'clock): 12 Maximum Distance 1 (cm): 1.5 Starting Position 2 (o'clock): 2 Ending Position 2 (o'clock): 6 Maximum Distance 2 (cm): 1.6 Undermining: Yes N/A N/A Classification: Category/Stage IV N/A N/A Exudate Amount: Large N/A N/A Exudate Type: Sanguinous N/A N/A Exudate Color: red N/A N/A Wound Margin: Epibole N/A N/A Granulation  Amount: Large (67-100%) N/A N/A Granulation Quality: Red, Pink N/A N/A Necrotic Amount: Small (1-33%) N/A N/A Exposed Structures: Fat Layer (Subcutaneous Tissue): N/A N/A Yes Muscle: Yes Fascia: No Tendon: No Joint: No Bone: No Epithelialization: None N/A N/A Assessment Notes: bleeding noted with assessment N/A N/A Treatment Notes BRADSHAW, MINIHAN ([id number]) Electronic Signature(s) Signed: 05/17/2021 11:01:31 AM By: Levora Dredge Entered By: Levora Dredge on 05/12/2021 11:29:12 [name] ([id number]) -------------------------------------------------------------------------------- [place] Details Patient Name: DEJON, LUKAS A. Date of Service: 05/12/2021 10:30 AM Medical Record Number: [id number] Patient Account Number: [id number] Date of Birth/Sex: Nov 15, 1945 (75 y.o. M) Treating RN: Levora Dredge Primary Care Abdirahim Flavell: [name] Other Clinician: Cornell Barman Referring Taiki Buckwalter: [name] Treating Sefora Tietje/Extender: Yaakov Guthrie in Treatment: 95 Active Inactive Electronic Signature(s) Signed: 05/17/2021 11:01:31 AM By: Levora Dredge Entered By: Levora Dredge on 05/12/2021 11:29:02 [name]  ([id number]) -------------------------------------------------------------------------------- Pain Assessment Details Patient Name: GERELL, FORTSON A. Date of Service: 05/12/2021 10:30 AM Medical Record Number: [id number] Patient Account Number: [id number] Date of Birth/Sex: 02-Aug-1945 (75 y.o. M) Treating RN: Levora Dredge Primary Care Dennisha Mouser: [name] Other Clinician: Cornell Barman Referring Maja Mccaffery: [name] Treating Hilari Wethington/Extender: Yaakov Guthrie in Treatment: 95 Active Problems Location of Pain Severity and Description of Pain Patient Has Paino No Site Locations Rate the pain. Current Pain Level: 0 Pain Management and Medication Current Pain Management: Notes no pain at wound site Electronic Signature(s) Signed: 05/17/2021 11:01:31 AM By: Levora Dredge Entered By: Levora Dredge on 05/12/2021 10:52:04 [name] ([id number]) -------------------------------------------------------------------------------- Patient/Caregiver Education Details Patient Name: [name] Date of Service: 05/12/2021 10:30 AM Medical Record Number: [id number] Patient Account Number: [id number] Date of Birth/Gender: 12-05-1945 (75 y.o. M) Treating RN: Levora Dredge Primary Care Physician: [name] Other Clinician: Cornell Barman Referring Physician: [name] Treating Physician/Extender: Yaakov Guthrie in Treatment: 95 Education Assessment Education Provided To: Patient Education Topics Provided Wound/Skin Impairment: Handouts: Caring for Your Ulcer Methods: Demonstration, Explain/Verbal Responses: State content correctly Electronic Signature(s) Signed: 05/17/2021 11:01:31 AM By: Levora Dredge Entered By: Levora Dredge on 05/12/2021 11:53:31 [name] ([id number]) -------------------------------------------------------------------------------- Wound Assessment Details Patient Name: [name] Date of  Service: 05/12/2021 10:30 AM Medical Record Number: [id number] Patient Account Number: [id number] Date of Birth/Sex: 26-Jul-1945 (75 y.o. M) Treating RN: Levora Dredge Primary Care Azaria Stegman: [name] Other Clinician: Cornell Barman Referring Kavan Devan: [name] Treating Chenoah Mcnally/Extender: Yaakov Guthrie in Treatment: 95 Wound Status Wound Number: 10 Primary Pressure Ulcer Etiology: Wound Location: Midline Sacrum Wound Open Wounding Event: Pressure Injury Status: Date Acquired: 06/07/2015 Comorbid Anemia, Hypertension, History of pressure wounds, Weeks Of Treatment: 95 History: Rheumatoid Arthritis, Paraplegia Clustered Wound: No Photos Wound Measurements Length: (cm) 10.5 % Reduc Width: (cm) 3.5 % Reduc Depth: (cm) 3.2 Epithel Area: (cm) 28.863 Tunnel Volume: (cm) 92.363 Underm Loca S E M Loca S E M tion in Area: 44% tion in Volume: 70.1% ialization: None ing: No ining: Yes tion 1 tarting Position (o'clock): 6 nding Position (o'clock): 12 aximum Distance: (cm) 1.5 tion 2 tarting Position (o'clock): 2 nding Position (o'clock): 6 aximum Distance: (cm) 1.6 Wound Description Classification: Category/Stage IV Foul Od Wound Margin: Epibole Slough/ Exudate Amount: Large Exudate Type: Sanguinous Exudate Color: red or After Cleansing: No Fibrino Yes Wound Bed Granulation Amount: Large (67-100%) Exposed Structure Granulation Quality: Red, Pink Fascia Exposed: No Necrotic Amount: Small (1-33%) Fat Layer (Subcutaneous Tissue) Exposed: Yes Necrotic Quality: Adherent Slough Tendon Exposed: No Muscle Exposed: Yes Necrosis of Muscle: No Joint  Exposed: No Bone Exposed: No DAOUD, LOBUE A. ([id number]) Assessment Notes bleeding noted with assessment Treatment Notes Wound #10 (Sacrum) Wound Laterality: Midline Cleanser Peri-Wound Care Topical Primary Dressing Gauze Discharge Instruction: Moistened with Dakins and packed lightly into  wound bed Secondary Dressing zetuvit 6x9 Secured With 24M Medipore H Soft Cloth Surgical Tape, 3x10 (in/yd) Discharge Instruction: Secure dressing Compression Wrap Compression Stockings Add-Ons Electronic Signature(s) Signed: 05/17/2021 11:01:31 AM By: Levora Dredge Entered By: Levora Dredge on 05/12/2021 11:00:26 [name] ([id number]) -------------------------------------------------------------------------------- Vitals Details Patient Name: [name] Date of Service: 05/12/2021 10:30 AM Medical Record Number: [id number] Patient Account Number: [id number] Date of Birth/Sex: [birth year] (75 y.o. M) Treating RN: Levora Dredge Primary Care Alishea Beaudin: [name] Other Clinician: Cornell Barman Referring [name] Koone: [name] Treating Shaterria Sager/Extender: Yaakov Guthrie in Treatment: 95 Vital Signs Time Taken: 10:45 Temperature (F): 98.4 Pulse (bpm): 100 Respiratory Rate (breaths/min): 18 Blood Pressure (mmHg): 102/54 Reference Range: 80 - 120 mg / dl Electronic Signature(s) Signed: 05/17/2021 11:01:31 AM By: Levora Dredge Entered By: Levora Dredge on 05/12/2021 10:51:46

## 2021-05-17 NOTE — Progress Notes (Signed)
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 drainage although he states this is not an increase in drainage over the past 2 weeks. He does change his dressings independently. He admits to sitting in his motorized chair for no more than 2-3 hours at which time he transfers to bed and rotates lateral position. 05/10/16;  Esvin Hnat returns today for review of his stage III sacral pressure ulcer. He denies any concerns over the last 2 weeks although he seems to be running out of Aquacel Ag and on those days he uses Medihoney. He has [facility] was supplying his dressings. He still complains of drainage. He does his dressings independently. He has in his motorized chair for 2-3 hours that time other than that he offloads this. Dimensions of the wound are down 1 cm in both directions. He underwent an aggressive debridement on his last visit of thick circumferential skin and subcutaneous tissue. It is possible at some point in the future he is going to need this done again 05/24/16; the patient returns today for review of his stage III sacral pressure ulcer. We have been  using Aquacel Ag he tells me that he changes this up to twice a day. I'm not really certain of the reason for this frequency of changing. He has some involvement from the home health nurses but I think is doing most of the changing himself which I think because of his paraplegia would be a very difficult exercise. Nevertheless he states that there is "wetness". I am not sure if there is another dressing that we could easily changed that much. I'd wanted to change to [facility] but I'll need to have a sense of how frequent he would need to change this. MERLIN, GOLDEN ([id number]) 06/14/16; this is a patient returns for review of his stage III sacral pressure ulcer. We have been using Aquacel Ag and over the last 2 visits he has had extensive debridement so of the thick circumferential skin and subcutaneous tissue that surrounds this wound. In spite of this really absolutely no change in the condition of the wound warrants measurements. We have [facility] home health I believe changing the dressing on 3 occasions the patient states he does this on one occasion himself 06/28/16; this is a patient who has a fairly large stage III sacral pressure  ulcer. I changed him to [facility] from Aquacel 2 weeks ago. He returns today in follow-up. In the meantime a nurse from [facility] has calledrequesting ordering of a wound VAC. He had this discussion before. The problem is the proximity of the lowest edge of this wound to the patient's anal opening roughly 3/4 of an inch. Can't see how this can be arranged. Apparently the nurse who is calling has a lot of experience, the question would be then when she is not available would be doing this. I would not have thought that this wound is not amenable to a wound VAC because of this reason 07/12/16; the patient comes in today and I have signed orders for a wound VAC. The home health team through [facility] is convinced that he can benefit from this even though there is close proximity to his anal opening beneath the gluteal clefts. The patient does not have a bowel regimen but states he has a bowel movement every 2 days this will also provide some problem with regards to the vac seal 07/26/16; the patient never did obtain a [place] wound VAC as he could not afford the $200 per month co-pay we have been using Hydrofera Blue now for 6 weeks or so. No major change in this wound at all. He is still not interested in the concept of plastic surgery. There changing the dressing every second day 08/09/16; the patient arrives with a wound precisely in the same situation. In keeping with the plan I outlined last time extensive debridement with an open curet the surface of this is not completely viable. Still has some degree of surrounding thick skin and subcutaneous tissue. No evidence of infection. Once again I have had a conversation with him about plastic surgery, he is simply not interested. 08/23/16; wound is really no different. Thick circumferential skin and subcutaneous tissue around the wound edge which is a lot better from debridement we did earlier in the year. The surface of the wound looks viable  however with a curet there is definitely a gritty surface to this. We use Medihoney for a while, he could not afford Santyl. I don't think we could get a supply of Iodoflex. He talks a little more positively about the concept of plastic surgery which I've  gone over with him today 08/31/16;; patient arrives in clinic today with the wound surface really no different there is no changes in dimensions. I debrided today surface on the left upper side of this wound aggressively week ago there is no real change here no evidence of epithelialization. The problem with debridement in the clinic is that he believes from this very liberally. We have been using Sorbact. 09/21/16; absolutely no change in the appearance or measurements of this wound. More recently I've been debrided in this aggressively and using sorbact to see if we could get to a better wound surface. Although this visually looks satisfactory, debridement reveals a very gritty surface to this. However even with this debridement and removal of thick nonviable skin and subcutaneous tissue from around the large amount of the circumference of this wound we have made absolutely no progress. This may be an offloading issue I'm just not completely certain. It has 2 close proximity in its inferior aspect to consider negative pressure therapy 10/26/16; READMISSION This patient called our clinic yesterday to report an odor in his wound. He had been to see plastic surgery at [facility] at our request after his last visit on 09/21/16; we have been seeing him for several months with a large stage III wound. He had been sent to general surgery for consideration of a colostomy, that appointment was not until mid June He comes in today with a temperature of 101. He is reporting an odor in the wound since last weekend. 01/10/17 Readmission: 01/10/17 On evaluation today it is noted that patient has been seen by plastic surgery at [facility] since he was last evaluated here. They did  discuss with him the possibility of a flap according to the notes but unfortunately at this point he was not quite ready to proceed with surgery and instead wanted to give the Wound VAC a try. In the hospital they were able to get a good seal on the Wound VAC. Unfortunately since that time they have been having trouble in regard to his current home health company keeping a simple on the Wound VAC. He would like to switch to a different home health company. With that being said it sounds as if the problem is that his wound VAC is not feeling at the lower portion of his back and he tells me that he can take some of the clear plastic and put over that area when the sill breaks and it will correct it for time. He has no discomfort or pain which is good news. He has been treated with IV vancomycin since he was last seen here and has an appointment with a infectious disease specialist in two days on 01/12/17. Otherwise he was transferred back to [place] for continuing to monitor and manage is wound as she progresses with a Wound VAC for the time being. 01/17/17 on evaluation today patient continues to show evidence of slight improvement with the Wound VAC fortunately there's no evidence of infection or otherwise worsening condition in general. Nonetheless we were unable to get him switch to [facility] in regard to home help from his current company. I'm not sure the reasoning behind but for some reason he was not accepted as a patient with him. Continue to apply the Wound VAC which does still show that some maceration around the wound edges but the wound measurements were slightly improved. No fevers, chills, nausea, or vomiting noted at this time. 02/14/17; this patient I have not seen in 5 months  although he has been readmitted to our clinic seen by our physician assistant Jeri Cos twice in early August. I have looked through [facility] notes care everywhere. The patient saw plastic surgery in May [Dr. Bhatt}.  The patient was sent to general surgery and ultimately had a colostomy placed. On 11/29/16. This was after he was admitted to [facility] sometime in May. An MRI of the pelvis on 5/23 showed osteomyelitis of the coccyx. An attempt was made to drain fluid that was not successful. He was treated with empiric broad-spectrum antibiotics VAC/cefepime/Flagyl starting on 11/02/16 with plans for a 6 week course. According to their notes he was sent to a nursing home. Was last seen by Dr. Myriam Jacobson of plastic surgery on 12/28/16. The first part of the note is a long dissertation about the difficulties finding adequate patients for flap closure of pressure ulcers. At that time the wound was noted to be stage IV based I think on underlying infection no exposed bone and healthy granulation tissue. 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 He was last seen by infectious disease 01/12/17 A Dr. Uvaldo Rising. His note says that Mr. Empey was not interested in a flap closure for referring a trial of the wound VAC. As previously anticipated the wound VAC could not be maintained as an outpatient in the community. He is now using something similar to a Dakin's wet to dry recommended by [facility] VASHE solution. He is placing this twice a day himself. This is almost s hopeless setting in terms of heeling 02/28/17; he is using a Dakin's wet to dry. Most of the wound surface looks satisfactory however the deeper area over his coccyx now has exposed bone I'm not sure if I noted this last week. 03/21/17; patient is usingVASHE solution wet to dry which I gather is a variation on Dakin solution. He has home health changing this 3 times a week the other days he does this himself. His appointment with plastic surgery 04/18/17; patient continues to use a variant of Dakin solution I believe. His wound continues to have a clean viable surface. The 2 areas of exposed bone in the center of this wound had closed over.  He has an appointment with plastic surgery on December 5 at which time I hope that there'll be a plan for myocutaneous flap closure In looking through [facility] link I couldn't find any more plastic surgery appointments. I did come across the fact that he is been followed by Bradley [name] ([id number]) hematology for a microcytic hypochromic anemia. He had a reasonably normal looking hemoglobin electrophoresis. His iron level was 10 and according to the patient he is going for IV iron infusions starting tomorrow. He had a sedimentation rate of 74. More problematically from a pure wound care point of view his albumin was 2.7 earlier this month 05/17/17; this is a patient I follow monthly. He has a large now stage IV wound over his bilateral buttocks with close proximity to his anal opening. More recently he has developed a large area with exposed bone in the center of this probably secondary to the underlying osteomyelitis E had in the summer. He also follows with Dr. Myriam Jacobson at [facility] who is plastic surgery. He had an appointment earlier this month and according to the patient Dr. Myriam Jacobson does not want to proceed with any attempted flap closure. Although I do not have current access to her note in care everywhere this is likely due to  exposed bone. Again according to the patient they did a bone biopsy. He is still using a variant of Dakin solution changing twice a day. He has home health. The patient is not able to give me a firm answer about how long he spends on this in his wheelchair The patient also states that Dr. Myriam Jacobson wanted to reconsider a wound VAC. I really don't see this as a viable option at least not in the outpatient setting. The wound itself is frankly to close to his anal opening to maintain a seal. The last time we tried to do this home health was unable to manage it. It might be possible to maintain a wound VAC in this setting outside of the home such as a skilled nursing facility or  an [facility] however I am doubtful about this even in that setting **** READMISSION 09/21/17-He is here for evaluation of stage IV sacral ulcer. Since his last evaluation here in December he has completed treatment for sacral osteomyelitis. He was at [place] for IV therapy and NPWT dressing changes. He was discharged, with home health services, in February. He admits that while in the skilled facility he had "80%" success with maintaining dressing, since discharge he has had approximately "40%" success with maintaining wound VAC dressing. We discussed at length that this is not a safe or recommended option. We will apply Dakin's wet to dry dressing daily and he will follow-up next week. He is accompanied today by his sister who is willing to assist in dressing changes; they will discuss the social issue as he feels he is capable of changing dressing daily when home health is not able. 09/28/17-He is here in follow-up evaluation for stage IV sacral pressure ulcer. He has been using the Dakin's wet-to-dry daily; he continues with home health. He is not accompanied by anyone at this visit. He will follow up in two weeks per his request/preference. 10/12/17 on evaluation today patient appears to be doing very well. The Dakin solution went to dry packings do seem to be helping him as far as the sacral wound is concerned I'm not seeing anything that has me more concerned as far as infection or otherwise is concerned. Overall I'm pleased with the appearance of the wound. 10/26/17-He is here in follow up evaluation for a stage IV sacral ulcer. He continues with daily Dakin's wet-to-dry. He is voicing no complaints or concerns. He will follow-up in 2 weeks 11/16/17-He is here for follow up evaluation for a palliative stage IV sacral pressure ulcer. We will continue with Dakin's wet-to-dry. He will follow- up in 4 weeks. He is expressing concern/complaint regarding new bed that has arrived, stating he is  unable to manipulate/maneuver it due to the bed crank being at the foot of the bed. 12/14/17-He is here for evaluation for palliative stage IV sacral ulcer. He is voicing no complaints or concerns. We will continue with one-to-one ratio of saline and dakins. He will follow-up in 3 weeks 01/04/18-He is here in follow-up evaluation for palliative stage IV sacral ulcer. He is inquiring about reinstating the negative pressure wound therapy. We discussed at length that the negative pressure wound therapy in the home setting has not been successful for him repeatedly with loss of cereal and unavailability of 24/7 help; reminded him that home health is not available 24/7 when loss of seal occurs. He does verbalize understanding to this and does not pursue. We also discussed the palliative nature of this ulcer (given no significant change/improvement  in measurement/appearance, not a candidate for muscle flap per plastic surgery, and continued independent living) and that the goal is for maintenance, decrease in infection and minimizing/avoiding deterioration given that he is independent in his care, does not have home health and requires daily dressing changes secondary to drainage amount. He is inquiring about a wound clinic in [facility], I have informed him that I am unfamiliar with that clinic but that he is encouraged to seek another opinion if that is his desire. We will continue with dakins and he will follow up in three weeks 01/25/18-He is here in follow-up evaluation for palliative stage IV sacral ulcer. He continues with Dakin's/saline 1:1 mixture wet to dry dressing changes. He states he has an appointment at [facility] on 9/17 for evaluation of surgical intervention/closure of the sacral ulcer. He will follow-up here in 4 weeks Readmission: 07/16/2019 upon evaluation today patient appears to be doing really about the same as when he was previously seen here in the wound care center. He most recently was a patient  of [facility] back when she was still working here in the center but had been referred to [facility] for consideration of a flap. With that being said the surgeon there at [facility] stated that this was too large for her flap and they have been attempting to get this smaller in order to be able to proceed with a flap. Nonetheless unfortunately he has had a cycle of going back and forth between the osteomyelitis flaring and then sent him back and then making a little bit of progress only to be sent back again. It sounds like most recently they have been using a Iodoflex type dressing at this point which does not seem to have done any harm by any means. With that being said this wound seems to be quite large for using Iodoflex throughout and subsequently I think he may do much better with the use of Vioxx moistened gauze which would be safe for the new tissue growing and also keep the wound quite nice and clean. The patient is not opposed to this he in fact states that his home health nurse had mentioned this as a possibility as well. 07/23/2019 upon evaluation today patient actually appears to be doing very well in regard to his sacral wound. In fact this is much healthier the measurements not terribly different but again with a wound like this at home necessarily expect a a lot of change as far as the overall measurements are concerned in just 1 week's time. This is going be a much longer term process at this point. With that being said I do think that he is very healthy appearing as far as the base of the wound is concerned. 08/06/2019 upon evaluation today patient actually appears to be doing about the same with regard to his wound to be honest. There is really not a significant improvement overall based on what I am seeing today. Fortunately there is no signs of significant systemic infection. No fevers, chills, nausea, vomiting, or diarrhea. With that being said there is odor to the drainage from the wound and  subsequently also what appears to be increased drainage based on what we are seeing today as well as what his home health nurse called [place] about that she was concerned with as well. 08/13/2019 upon evaluation today patient appears to be doing about the same at this point with regard to his wound although I think the dressing may be a little less drainage wise  compared to what it was previous. Fortunately there is no signs of active infection at this time. No fevers, chills, nausea, vomiting, or diarrhea. He has been taking the antibiotic for only 3 days. 08/23/2019 upon evaluation today patient appears to be doing not nearly as well with regard to his wound. In general really not making a lot of progress which is unfortunate. There does not appear to be any signs of active infection at this time. No fevers, chills, nausea, vomiting, or diarrhea. With that being said the patient unfortunately does seem to be experiencing continued epiboly around the edges of the wound as well as significant scar tissue he has been in the majority of his day sitting in his chair which is likely a big reason for all of this. Fortunately there is no [place], Matthan A. ([id number]) signs of active infection at this time. No fevers, chills, nausea, vomiting, or diarrhea. 08/29/2019 upon evaluation today patient's wound actually appears to be showing some signs of improvement. The region of few areas of new skin growth around the edges of the wound even where he has some of the epiboly. This is actually good news and I think that he has been very aggressive and offloading over the past week since we showed him the pictures of his wound last week. There still may be a chance that he is going require some sharp debridement to clear away some of these edges but to be honest I think that is can be quite an undertaking. The main reason is that he has a lot of thickened scar tissue and it is good to bleed quite significantly in my  opinion. The I think if organ to do that we may even need to have a portable or disposable cautery in order to make sure that we are able to get the area completely sealed up as far as bleeding is concerned. I do have one that we can utilize. However being that the wound looks so much better right now would like to give this 2 weeks to see how things stand and look at that point before making any additional recommendations. 09/12/2019 upon evaluation today patient appears to be making some progress as far as offloading is concerned. He still has a substantial wound but nonetheless he tells me he is off loading much more aggressively at this point. This is obviously good news. No fevers, chills, nausea, vomiting, or diarrhea. 4/22; I have not seen this patient in quite some time. No major change in the condition of the wound or its wound volume. Surrounding maceration of the skin around the edges of the wound. The wound is fairly substantial. To close to the anal opening to consider a wound VAC. He had extensive trials of plastic surgery at [facility] which really does not result in any improvement. His wound bed is however clean 10/10/2019 upon evaluation today patient appears to be doing well with regard to the wound in the sacral region. He has been tolerating the dressing changes without complication. Fortunately there is no signs of infection and he actually has some epithelial growth noted as well. He tells me has been trying to continue to offload this area which is excellent that is what he needs to do most as far as what he can have in his control. Otherwise I feel like the collagen with a saline moistened gauze behind has been beneficial for him. 5/20; collagen and normal saline wet-to-dry. Although the tissue when this large sacral  wound looks reasonable there is been absolutely no benefit to the overall closure. He has macerated skin around this. He tells me that he spends a large part of  the day up in the wheelchair. He still has home health 11/07/2019 upon evaluation today patient appears to be doing well with regard to his wounds at this point. Fortunately there is no signs of active infection at this time. Overall I feel like his sacral wound in general appears to be doing quite nicely. I am very pleased with overall how things are progressing and I feel like the patient is making excellent progress towards resolution. With that being said he still has a long ways to go before we expect to see this completely healed. 11/21/2019 upon evaluation today patient appears to be doing about the same in regard to his sacral wound. Unfortunately he is really not making much in the way of improvement when I questioned him about how much time he is really spending sitting on his bottom he tells me that the majority of his day is mainly in that position though he does spend some time in bed. It does not sound like it is enough however neither does it look like it is enough. 12/24/2019 upon evaluation today patient appears to be doing really about the same there is no significant improvement overall in his wound bed. Last time he was seen here we did have a discussion about the possibility of seeing if I can get a surgeon to evaluate and potentially clear away some of the thickened edges of the wound in order to allow this wound to potentially have a chance of closing with a wound VAC. Right now we could not even get a wound VAC to seal with some of the rolled and thickened edges. With that being said elected to the patient to think about this to discuss today. At this point the patient does want to see if I could make the referral to potentially see if the surgeon would be able to perform this and subsequently he agrees to go to a skilled nursing facility following if they are able to do this in order to allow for a wound VAC and to get this wound to heal more effectively and quickly. In my  opinion the only way that I would recommend that he see the surgeon to clear away the edges would be if he is also in agreement with going to skilled nursing facility as I know he cannot appropriately offload at home to take care of this and that setting. 01/07/2020 on evaluation today patient appears to be doing about the same in regard to his wound. There is no signs of active infection at this time which is great news. All in all he is really doing about the same. We have made referral to surgery to see if they can potentially perform debridement to clear away some of the scarred edging of the wound so we could potentially see about a wound VAC to try to help this heal more efficiently. Again we have discussed that if this occurs he would be in a skilled nursing facility following or release would need to be in order to appropriately offload. Nonetheless we are still in the process of working that referral. 01/21/2020 upon evaluation today patient appears to be doing about the same in regard to his sacral wound. He did see Dr. Lysle Pearl Dr. Ines Bloomer feeling was that there was not anything surgically he could offer at this  point as he did not feel like that a wound VAC would likely be appropriate for the patient. With that being said he did recommend potential for trying to get the patient into a skilled nursing facility as an outpatient and they will get me working on that as well. Fortunately there is no signs of active infection at this time. 11/10; this is a patient that has not been seen here in quite a bit of time while we were in our alternative venue. As well I have not seen this patient in probably over 2 years. As I remember things with him he has had this pressure ulcer encompassing his lower sacrum and coccyx and the surrounding buttocks for a prolonged period of time. When he first came here the surface looked fairly good and the wound was smaller we attempted a wound VAC but we could never  keep this in place. Because of nonresponse of the wound to topical dressings we referred him to [facility] plastic surgery where preparation was being made for flap closure. Intact he had a colostomy however after that he was deemed to have 2 large wound for flap closure. He did have osteomyelitis in the coccyx area in 2019. He is using wet-to-dry he has home health. They are managing his ostomy supplies as well. The wound is substantially larger than what I remember. Versus last time he was here to wound months or so ago he has an unstageable wound on the left upper thigh posteriorly. He tells me he is in his chair quite a bit especially recently 12/1; patient comes back to clinic. He did not get the lab work done apparently home health would not do this they referred him to the lab where he is apparently having a CBC checked because he is on iron infusions. We will try to get the lab work I ordered done there. He also did not get the x-ray he says he does not remember the discussion. The issue is that this large wound has probing areas to bone. He may have underlying osteomyelitis again he was treated for this previously I think in 2019 12/15; the patient did have lab work done. Notable for iron deficiency anemia with a hemoglobin of 9.7 although he is following with hematology for this his CRP was 7.1 which is up from his previous value. sedimentation rate of 91 which is also higher. However he maintains a relatively high sedimentation rate. Most disturbingly was an albumin of 2.8 although this is also somewhat higher than the 2.2 we had previously. With some difficulty I think a home health nurse actually made him some Dakin's solution he is using a Dakin's wet-to-dry to the large sacral wound. He has the area on the left upper thigh as well Bradley [name], Bradley [name] ([id number]) 06/17/2020; I follow this patient on a monthly basis. 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  At one point he had underlying osteomyelitis here and he received antibiotic therapy. I believe this was done through [facility] We have been using palliative Dakin's solution wet-to-dry on this which is really This very large wound about the same with healthy surface. No active infection that is obvious. He has developed an area on the left posterior thigh and now a mirror image area on the right posterior thigh noted pressure injuries from the wheelchair. This occurs even though he has a Roho cushion which is apparently new I have been forwarded lab work from 05/15/2020.  At that point his hemoglobin was 9.2 down from 13.8 on 10/5 this is microcytic hypochromic. He has known iron deficiency although the exact cause of this is never really been clear to me. He follows with hematology oncology and has been receiving IV iron he is supposed to follow-up in mid January and I told the patient this. From wound care point of view his albumin is 2.9 sedimentation rate is 74. His sedimentation rate has always been elevated in this range however his C-reactive protein was 82 which is way above anything previously measured. I see that on 05/08/2020 this was 7.1 I am not sure why the rapid discrepancy. He is supposed to have an x-ray of his lower sacrum looking for evidence of osteomyelitis that he was previously treated for. Is possible he would also require more advanced imaging such as another C CT or MRI He comes in also with paperwork for a Cl[place]ron air-fluidized bed with a trapeze bar. I asked him if he would be able to transfer in and out of this bed . I am not opposed to ordering it, but I do not want to make his transfers more difficult than they already are. He has a [employer] fast streamer 07/15/2020; he is managed to get his Cl[place]ron air-fluidized bed. He has a new electric wheelchair with a wheelchair cushion. The x-ray I did showed some sacral bone destruction although this did not seem progressive him last time. He was  treated for osteomyelitis in this area previously in 2018. His sedimentation rate is 91 CRP 7.1 albumin 2.8. We are using Dakin's wet-to-dry. He has [facility] home health 4/6; 68-month follow-up. He has a Cl[place]ron air-fluidized bed. He has the wheelchair cushion. His major wound on the coccyx looks somewhat better including including both of [place] my nurse and my feeling that it is closed in somewhat. Wound is still too close to his anal opening to consider a wound VAC. At 1 point we were trying to get him into [facility] plastics to get a flap closure but for 1 reason or another this never moved forward I was never really certain what the issue was. He has an area on the left upper thigh in the gluteal fold. This is a clean wound. 6/1; 68-month follow-up. I follow this man on a palliative basis he has a chronic stage IV wound over the lower sacrum coccyx and surrounding buttocks. He has an air-fluidized bed and a wheelchair cushion but he spends more time in the wheelchair than he does off this area by his own admission. I do not think the area is really changed that much although it is probably deeper in 1 section. He does tell us that he was discharged from home health because of lack of change of the wound. This was [employer]. He was treated for underlying osteomyelitis several years ago and he may have underlying chronic osteomyelitis. I do not believe he ever got the x-ray I ordered 8/3; patient presents for his 13-month follow-up. He reports no issues or complaints today. He continues to use Dakin's wet-to-dry dressings to his sacrum and silver alginate to his left lateral thigh wound. He denies signs of infection. 10/5; patient presents for 36-month follow-up. He has no issues or complaints today. He continues to use Dakin's wet-to-dry dressings to his sacrum. He states that the left lateral thigh wound is healed. He denies signs of infection. 12/7; patient presents for 67-month follow-up. He states he would  like to obtain a new air loss  mattress bed. He states his current one is having technical difficulties. He currently is using Dakin's wet-to-dry dressings. He denies signs of infection. Objective Constitutional Vitals Time Taken: 10:45 AM, Temperature: 98.4 F, Pulse: 100 bpm, Respiratory Rate: 18 breaths/min, Blood Pressure: 102/54 mmHg. General Notes: Sacral ulcer: Granulation tissue throughout. Slight pale appearance. No probing to bone. No obvious signs of infection. Integumentary (Hair, Skin) Wound #10 status is Open. Original cause of wound was Pressure Injury. The date acquired was: 06/07/2015. The wound has been in treatment 95 weeks. The wound is located on the Midline Sacrum. The wound measures 10.5cm length x 3.5cm width x 3.2cm depth; 28.863cm^2 area and 92.363cm^3 volume. There is muscle and Fat Layer (Subcutaneous Tissue) exposed. There is no tunneling noted, however, there is undermining starting at 6:00 and ending at 12:00 with a maximum distance of 1.5cm. There is additional undermining and at 2:00 and ending at 6:00 with a maximum distance of 1.6cm. There is a large amount of sanguinous drainage noted. The wound margin is epibole. There is large (67-100%) red, pink granulation within the wound bed. There is a small (1-33%) amount of necrotic tissue within the wound bed including Adherent Slough. General Notes: bleeding noted with assessment Assessment Active Problems LINDWOOD, MOGEL. 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 He has chronic osteomyelitis and this is a palliative wound care case. No obvious signs of soft tissue infection. I recommended Dakin's wet-to-dry with aggressive offloading. We did not originally order the air mattress bed for him and we will reach out to his supplier to see if  we can get a new bed for him. Plan Follow-up Appointments: Return Appointment in: - 2 months Bathing/ Shower/ Hygiene: May shower; gently cleanse wound with antibacterial soap, rinse and pat dry prior to dressing wounds Off-Loading: Hospital bed/mattress Air fluidized (Group 3) Turn and reposition every 2 hours Additional Orders / Instructions: Follow Nutritious Diet and Increase Protein Intake Devices ordered were: Bed- Mattress-Overlay or Replacement - Repair or replace patient's bed. Spoke with Murray Hodgkins [phone] at [facility] about bed. She states I need to Fax [phone]) notes and she will contact the patient and discuss replacement or repair. WOUND #10: - Sacrum Wound Laterality: Midline Primary Dressing: Gauze (Generic) 1 x Per Day/30 Days Discharge Instructions: Moistened with Dakins and packed lightly into wound bed Secondary Dressing: zetuvit 6x9 1 x Per Day/30 Days Secured With: 31M Medipore H Soft Cloth Surgical Tape, 3x10 (in/yd) (Generic) 1 x Per Day/30 Days Discharge Instructions: Secure dressing 1. Dakin's wet-to-dry 2. Aggressive offloading 3. Reach out to original supplier of air loss mattress/bed to obtain a new one Electronic Signature(s) Signed: 05/12/2021 12:18:30 PM By: Kalman Shan DO Entered By: Kalman Shan on 05/12/2021 12:04:39 Bradley [name] ([id number]) -------------------------------------------------------------------------------- SuperBill Details Patient Name: [name] Date of Service: 05/12/2021 Medical Record Number: [id number] Patient Account Number: [id number] Date of Birth/Sex: 22-Jan-1946 (75 y.o. 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 Facility Procedures CPT4 Code: 18563149 Description: 99213 - WOUND CARE VISIT-LEV 3 EST PT Modifier: Quantity: 1 Physician Procedures  CPT4 Code: 7782423 Description: 53614 - WC PHYS LEVEL 3 - EST PT Modifier: Quantity: 1 CPT4 Code: Description: ICD-10 Diagnosis Description L89.154 Pressure ulcer of sacral region, stage 4 M86.68 Other chronic osteomyelitis, other site G82.20 Paraplegia, unspecified L97.128 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

## 2021-05-19 ENCOUNTER — Inpatient Hospital Stay (HOSPITAL_BASED_OUTPATIENT_CLINIC_OR_DEPARTMENT_OTHER): Payer: Medicare HMO | Admitting: Nurse Practitioner

## 2021-05-19 ENCOUNTER — Encounter: Payer: Self-pay | Admitting: Nurse Practitioner

## 2021-05-19 ENCOUNTER — Ambulatory Visit: Payer: Medicare HMO | Admitting: Oncology

## 2021-05-19 ENCOUNTER — Inpatient Hospital Stay: Payer: Medicare HMO

## 2021-05-19 ENCOUNTER — Other Ambulatory Visit: Payer: Self-pay

## 2021-05-19 ENCOUNTER — Other Ambulatory Visit: Payer: Self-pay | Admitting: *Deleted

## 2021-05-19 VITALS — BP 156/75 | HR 66 | Resp 16

## 2021-05-19 VITALS — BP 155/78 | HR 72 | Temp 98.4°F | Resp 16

## 2021-05-19 DIAGNOSIS — D508 Other iron deficiency anemias: Secondary | ICD-10-CM

## 2021-05-19 DIAGNOSIS — D509 Iron deficiency anemia, unspecified: Secondary | ICD-10-CM | POA: Diagnosis not present

## 2021-05-19 DIAGNOSIS — D500 Iron deficiency anemia secondary to blood loss (chronic): Secondary | ICD-10-CM

## 2021-05-19 MED ORDER — SODIUM CHLORIDE 0.9 % IV SOLN
Freq: Once | INTRAVENOUS | Status: AC
  Filled 2021-05-19: qty 250

## 2021-05-19 MED ORDER — SODIUM CHLORIDE 0.9 % IV SOLN: 200.0000 mg | Freq: Once | INTRAVENOUS | Status: DC

## 2021-05-19 MED ORDER — IRON SUCROSE 20 MG/ML IV SOLN
200.0000 mg | Freq: Once | INTRAVENOUS | Status: AC
  Administered 2021-05-19: 16:00:00 200 mg via INTRAVENOUS
  Filled 2021-05-19: qty 10

## 2021-05-19 NOTE — Progress Notes (Signed)
Hematology/Oncology Follow Up Note [facility] Telephone:([phone] [phone]  CONSULT NOTE Patient Care Team: [name], MD as PCP - General (Family Medicine)  CHIEF COMPLAINTS/PURPOSE OF CONSULTATION:  Anemia  HISTORY OF PRESENTING ILLNESS:  Bradley [name] 75 y.o.  male with PMH listed as below who was referred to me for evaluation of iron deficiency anemia.   Patient is paraplegic, also has chronic pressure ulcer, recently just had colostomy. He self catheterizes. Patient feels that his energy levels have been the same. He always has mild fatigue.   He has BPH for which he take finasteride. He has not see his urologist for a while.  He was seen by [facility] for elevated ESR and his chronic hand joint tenderness. He was not considered to have rheumatoid arthritis.   INTERVAL HISTORY Patient presents to follow-up for anemia management. He feels increasingly fatigued. Also complains of left ear pain. No drainage, fevers but ear is painful to touch. No shortness of breath or chest pain. He picked up some oral iron solution at the drug store and questions usefulness.   ROS:  Review of Systems  Constitutional:  Negative for fatigue.  HENT:  Negative.  Negative for lump/mass and mouth sores.   Eyes: Negative.  Negative for eye problems.  Respiratory: Negative.  Negative for chest tightness and cough.   Cardiovascular: Negative.  Negative for chest pain.  Gastrointestinal: Negative.  Negative for abdominal distention and abdominal pain.       Colostomy  Endocrine: Negative.  Negative for hot flashes.  Genitourinary:  Negative for hematuria.        Self catherize  Musculoskeletal:  Positive for arthralgias. Negative for back pain.       Paraplegic with chronic spasticity of lower extremities  Skin:  Negative for itching and rash.       Chronic pressure ulcer  Neurological: Negative.  Negative for dizziness.  Hematological: Negative.  Negative  for adenopathy.  Psychiatric/Behavioral: Negative.  The patient is not nervous/anxious.    MEDICAL HISTORY:  Past Medical History:  Diagnosis Date   Anemia    Arthritis    Paralysis of both lower limbs ([place])    Sacral decubitus ulcer     SURGICAL HISTORY: Past Surgical History:  Procedure Laterality Date   COLONOSCOPY  06/06/2014   INCISION AND DRAINAGE OF WOUND N/A 06/04/2018   Procedure: IRRIGATION AND DEBRIDEMENT WOUND;  Surgeon: Olean Ree, MD;  Location: [facility] ORS;  Service: General;  Laterality: N/A;   TONSILLECTOMY      SOCIAL HISTORY: Social History   Socioeconomic History   Marital status: Married    Spouse name: Not on file   Number of children: Not on file   Years of education: Not on file   Highest education level: Not on file  Occupational History   Not on file  Tobacco Use   Smoking status: Former    Packs/day: 0.50    Years: 5.00    Pack years: 2.50    Types: Cigarettes    Quit date: 42    Years since quitting: 50.9   Smokeless tobacco: Never  Vaping Use   Vaping Use: Never used  Substance and Sexual Activity   Alcohol use: No   Drug use: No   Sexual activity: Not on file  Other Topics Concern   Not on file  Social History Narrative   Not on file   Social Determinants of Health   Financial Resource Strain: Not on file  Food Insecurity: Not on file  Transportation Needs: Not on file  Physical Activity: Not on file  Stress: Not on file  Social Connections: Not on file  Intimate Partner Violence: Not on file    FAMILY HISTORY: Family History  Problem Relation Age of Onset   Breast cancer Mother     ALLERGIES:  has No Known Allergies.  MEDICATIONS:  Current Outpatient Medications  Medication Sig Dispense Refill   acetaminophen (TYLENOL) 500 MG tablet Take 1,000 mg by mouth 3 (three) times daily.      amLODipine (NORVASC) 5 MG tablet Take 5 mg by mouth daily.     finasteride (PROSCAR) 5 MG tablet Take 5 mg by mouth daily.      Multiple Vitamin (MULTI-VITAMINS) TABS Take 1 tablet by mouth daily.      polyethylene glycol powder (GLYCOLAX/MIRALAX) powder Take 17 g by mouth daily as needed for mild constipation.      vitamin C (ASCORBIC ACID) 500 MG tablet Take 500 mg by mouth 2 (two) times daily.     No current facility-administered medications for this visit.      Marland Kitchen  PHYSICAL EXAMINATION: ECOG PERFORMANCE STATUS: 2 - Symptomatic, <50% confined to bed Vitals:   05/19/21 1431  BP: (!) 155/78  Pulse: 72  Resp: 16  Temp: 98.4 F (36.9 C)   There were no vitals filed for this visit.  Physical Exam Constitutional:      Appearance: He is not ill-appearing.  Eyes:     General: No scleral icterus.    Conjunctiva/sclera: Conjunctivae normal.  Cardiovascular:     Rate and Rhythm: Normal rate and regular rhythm.  Abdominal:     General: There is no distension.     Palpations: Abdomen is soft.     Tenderness: There is no abdominal tenderness. There is no guarding.  Musculoskeletal:        General: No deformity.     Right lower leg: No edema.     Left lower leg: No edema.  Lymphadenopathy:     Cervical: No cervical adenopathy.  Skin:    General: Skin is warm and dry.  Neurological:     Mental Status: He is alert and oriented to person, place, and time. Mental status is at baseline.  Psychiatric:        Mood and Affect: Mood normal.        Behavior: Behavior normal.      LABORATORY DATA:  I have reviewed the data as listed Lab Results  Component Value Date   WBC 8.8 05/17/2021   HGB 11.4 (L) 05/17/2021   HCT 37.4 (L) 05/17/2021   MCV 77.9 (L) 05/17/2021   PLT 295 05/17/2021   Iron/TIBC/Ferritin/ %Sat    Component Value Date/Time   IRON 24 (L) 05/17/2021 1415   TIBC 265 05/17/2021 1415   FERRITIN 138 05/17/2021 1415   IRONPCTSAT 9 (L) 05/17/2021 1415   Negative stool occult.   ASSESSMENT & PLAN:  No diagnosis found.  Anemia- Multifactorial, likely secondary to iron deficiency anemia  and chronic inflammation. Labs are reviewed and discussed with patient. Hemoglobin has improved to 11, iron saturation 9 ferritin 138. He responded to IV Venofer treatments very well.Recommend additional venofer x 3 due to low iron saturation and symptoms. He can try starting oral iron to assess tolerance.  2. Thrombocytosis likely due to iron deficiency.  Monitor.  All questions were answered. The patient knows to call the clinic with any problems questions or concerns.  Return of visit:  Venofer x 3 Rtc in 3 months for labs (cbc, ferritin, iron studies) then day to week later see Dr Tasia Catchings, +/- venofer  Beckey Rutter, [place], AGNP-C [facility] at [facility] [phone] (clinic) 05/19/2021

## 2021-05-19 NOTE — Patient Instructions (Signed)

## 2021-05-19 NOTE — Progress Notes (Signed)
Pt states that he feels that something is in his ear on left side. He has shoulder pain on tight side and that is not new. More tired than usual

## 2021-05-20 ENCOUNTER — Telehealth: Payer: Self-pay | Admitting: *Deleted

## 2021-05-20 ENCOUNTER — Other Ambulatory Visit: Payer: Self-pay | Admitting: Nurse Practitioner

## 2021-05-20 MED ORDER — AMOXICILLIN-POT CLAVULANATE 875-125 MG PO TABS
1.0000 | ORAL_TABLET | Freq: Two times a day (BID) | ORAL | 0 refills | Status: AC
Start: 2021-05-20 — End: 2021-05-25

## 2021-05-20 NOTE — Telephone Encounter (Signed)
Patient called stating that he was to have had a antibiotic called in for him yesterday and it was not done. Please advise

## 2021-05-26 ENCOUNTER — Inpatient Hospital Stay: Payer: Medicare HMO

## 2021-06-02 ENCOUNTER — Inpatient Hospital Stay: Payer: Medicare HMO

## 2021-06-02 ENCOUNTER — Other Ambulatory Visit: Payer: Self-pay

## 2021-06-02 VITALS — BP 150/72 | HR 64 | Temp 97.7°F | Resp 18

## 2021-06-02 DIAGNOSIS — D509 Iron deficiency anemia, unspecified: Secondary | ICD-10-CM | POA: Diagnosis not present

## 2021-06-02 DIAGNOSIS — D508 Other iron deficiency anemias: Secondary | ICD-10-CM

## 2021-06-02 MED ORDER — SODIUM CHLORIDE 0.9 % IV SOLN
Freq: Once | INTRAVENOUS | Status: AC
  Filled 2021-06-02: qty 250

## 2021-06-02 MED ORDER — SODIUM CHLORIDE 0.9 % IV SOLN: 200.0000 mg | Freq: Once | INTRAVENOUS | Status: DC

## 2021-06-02 MED ORDER — IRON SUCROSE 20 MG/ML IV SOLN
200.0000 mg | Freq: Once | INTRAVENOUS | Status: AC
  Administered 2021-06-02: 15:00:00 200 mg via INTRAVENOUS
  Filled 2021-06-02: qty 10

## 2021-06-09 ENCOUNTER — Inpatient Hospital Stay: Payer: Medicare HMO

## 2021-06-16 ENCOUNTER — Inpatient Hospital Stay: Payer: Medicare HMO

## 2021-06-16 ENCOUNTER — Encounter: Payer: Self-pay | Admitting: Oncology

## 2021-06-16 ENCOUNTER — Other Ambulatory Visit: Payer: Self-pay

## 2021-06-16 ENCOUNTER — Inpatient Hospital Stay: Payer: Medicare HMO | Attending: Oncology

## 2021-06-16 VITALS — BP 141/69 | HR 74 | Temp 97.2°F | Resp 18

## 2021-06-16 DIAGNOSIS — D509 Iron deficiency anemia, unspecified: Secondary | ICD-10-CM | POA: Diagnosis not present

## 2021-06-16 DIAGNOSIS — D508 Other iron deficiency anemias: Secondary | ICD-10-CM

## 2021-06-16 DIAGNOSIS — I10 Essential (primary) hypertension: Secondary | ICD-10-CM | POA: Diagnosis not present

## 2021-06-16 DIAGNOSIS — Z136 Encounter for screening for cardiovascular disorders: Secondary | ICD-10-CM | POA: Diagnosis not present

## 2021-06-16 MED ORDER — SODIUM CHLORIDE 0.9 % IV SOLN
Freq: Once | INTRAVENOUS | Status: AC
  Filled 2021-06-16: qty 250

## 2021-06-16 MED ORDER — SODIUM CHLORIDE 0.9 % IV SOLN: 200.0000 mg | Freq: Once | INTRAVENOUS | Status: DC

## 2021-06-16 MED ORDER — IRON SUCROSE 20 MG/ML IV SOLN
200.0000 mg | Freq: Once | INTRAVENOUS | Status: AC
  Administered 2021-06-16: 200 mg via INTRAVENOUS
  Filled 2021-06-16: qty 10

## 2021-06-18 DIAGNOSIS — G8220 Paraplegia, unspecified: Secondary | ICD-10-CM | POA: Diagnosis not present

## 2021-06-18 DIAGNOSIS — M869 Osteomyelitis, unspecified: Secondary | ICD-10-CM | POA: Diagnosis not present

## 2021-06-18 DIAGNOSIS — L89154 Pressure ulcer of sacral region, stage 4: Secondary | ICD-10-CM | POA: Diagnosis not present

## 2021-06-23 ENCOUNTER — Inpatient Hospital Stay: Payer: Medicare HMO

## 2021-06-23 DIAGNOSIS — S24103S Unspecified injury at T7-T10 level of thoracic spinal cord, sequela: Secondary | ICD-10-CM | POA: Diagnosis not present

## 2021-06-23 DIAGNOSIS — Z0000 Encounter for general adult medical examination without abnormal findings: Secondary | ICD-10-CM | POA: Diagnosis not present

## 2021-06-23 DIAGNOSIS — L608 Other nail disorders: Secondary | ICD-10-CM | POA: Diagnosis not present

## 2021-06-23 DIAGNOSIS — Z933 Colostomy status: Secondary | ICD-10-CM | POA: Diagnosis not present

## 2021-06-30 ENCOUNTER — Inpatient Hospital Stay: Payer: Medicare HMO

## 2021-06-30 ENCOUNTER — Other Ambulatory Visit: Payer: Self-pay

## 2021-07-03 NOTE — Telephone Encounter (Signed)
Error

## 2021-07-05 DIAGNOSIS — R600 Localized edema: Secondary | ICD-10-CM | POA: Diagnosis not present

## 2021-07-05 DIAGNOSIS — B351 Tinea unguium: Secondary | ICD-10-CM | POA: Diagnosis not present

## 2021-07-05 DIAGNOSIS — S24103S Unspecified injury at T7-T10 level of thoracic spinal cord, sequela: Secondary | ICD-10-CM | POA: Diagnosis not present

## 2021-07-07 ENCOUNTER — Other Ambulatory Visit: Payer: Self-pay

## 2021-07-07 ENCOUNTER — Encounter: Payer: Medicare HMO | Attending: Internal Medicine | Admitting: Internal Medicine

## 2021-07-07 DIAGNOSIS — Z933 Colostomy status: Secondary | ICD-10-CM | POA: Diagnosis not present

## 2021-07-07 DIAGNOSIS — G8220 Paraplegia, unspecified: Secondary | ICD-10-CM | POA: Insufficient documentation

## 2021-07-07 DIAGNOSIS — L89154 Pressure ulcer of sacral region, stage 4: Secondary | ICD-10-CM | POA: Diagnosis not present

## 2021-07-07 DIAGNOSIS — M8668 Other chronic osteomyelitis, other site: Secondary | ICD-10-CM | POA: Insufficient documentation

## 2021-07-07 NOTE — Progress Notes (Addendum)
Bradley [name], Bradley [name] ([id number]) Visit Report for 07/07/2021 Arrival Information Details Patient Name: Bradley [name], [name]. Date of Service: 07/07/2021 10:30 AM Medical Record Number: [id number] Patient Account Number: [id number] Date of Birth/Sex: 12-11-1945 (76 y.o. M) Treating RN: Levora Dredge Primary Care Laster Appling: [name] Other Clinician: Referring Jaquisha Frech: [name] Treating Quantay Zaremba/Extender: Yaakov Guthrie in Treatment: 74 Visit Information History Since Last Visit Added or deleted any medications: No Patient Arrived: Wheel Chair Any new allergies or adverse reactions: No Arrival Time: 11:00 Had a fall or experienced change in No Accompanied By: self activities of daily living that may affect Transfer Assistance: Harrel Lemon Lift risk of falls: Patient Requires Transmission-Based Precautions: No Hospitalized since last visit: No Patient Has Alerts: No Has Dressing in Place as Prescribed: Yes Pain Present Now: Yes Electronic Signature(s) Signed: 07/07/2021 1:53:04 PM By: Levora Dredge Entered By: Levora Dredge on 07/07/2021 11:01:12 [name] ([id number]) -------------------------------------------------------------------------------- Clinic Level of Care Assessment Details Patient Name: Bradley Loa A. Date of Service: 07/07/2021 10:30 AM Medical Record Number: [id number] Patient Account Number: [id number] Date of Birth/Sex: Oct 10, 1945 (76 y.o. M) Treating RN: Levora Dredge Primary Care Regis Wiland: [name] Other Clinician: Referring Aadon Gorelik: [name] Treating Jakara Blatter/Extender: Yaakov Guthrie in Treatment: 103 Clinic Level of Care Assessment Items TOOL 4 Quantity Score []  - Use when only an EandM is performed on FOLLOW-UP visit 0 ASSESSMENTS - Nursing Assessment / Reassessment []  - Reassessment of Co-morbidities (includes updates in patient status) 0 []  - 0 Reassessment of Adherence to Treatment Plan ASSESSMENTS  - Wound and Skin Assessment / Reassessment X - Simple Wound Assessment / Reassessment - one wound 1 5 []  - 0 Complex Wound Assessment / Reassessment - multiple wounds []  - 0 Dermatologic / Skin Assessment (not related to wound area) ASSESSMENTS - Focused Assessment []  - Circumferential Edema Measurements - multi extremities 0 []  - 0 Nutritional Assessment / Counseling / Intervention []  - 0 Lower Extremity Assessment (monofilament, tuning fork, pulses) []  - 0 Peripheral Arterial Disease Assessment (using hand held doppler) ASSESSMENTS - Ostomy and/or Continence Assessment and Care []  - Incontinence Assessment and Management 0 []  - 0 Ostomy Care Assessment and Management (repouching, etc.) PROCESS - Coordination of Care X - Simple Patient / Family Education for ongoing care 1 15 []  - 0 Complex (extensive) Patient / Family Education for ongoing care []  - 0 Staff obtains Programmer, systems, Records, Test Results / Process Orders []  - 0 Staff telephones HHA, Nursing Homes / Clarify orders / etc []  - 0 Routine Transfer to another Facility (non-emergent condition) []  - 0 Routine Hospital Admission (non-emergent condition) []  - 0 New Admissions / Biomedical engineer / Ordering NPWT, Apligraf, etc. []  - 0 Emergency Hospital Admission (emergent condition) X- 1 10 Simple Discharge Coordination []  - 0 Complex (extensive) Discharge Coordination PROCESS - Special Needs []  - Pediatric / Minor Patient Management 0 []  - 0 Isolation Patient Management []  - 0 Hearing / Language / Visual special needs []  - 0 Assessment of Community assistance (transportation, D/C planning, etc.) []  - 0 Additional assistance / Altered mentation []  - 0 Support Surface(s) Assessment (bed, cushion, seat, etc.) INTERVENTIONS - Wound Cleansing / Measurement Bradley [name], Bradley A. ([id number]) X- 1 5 Simple Wound Cleansing - one wound []  - 0 Complex Wound Cleansing - multiple wounds X- 1 5 Wound Imaging  (photographs - any number of wounds) []  - 0 Wound Tracing (instead of photographs) X- 1 5 Simple Wound Measurement - one wound []  - 0 Complex Wound Measurement -  multiple wounds INTERVENTIONS - Wound Dressings X - Small Wound Dressing one or multiple wounds 1 10 []  - 0 Medium Wound Dressing one or multiple wounds []  - 0 Large Wound Dressing one or multiple wounds []  - 0 Application of Medications - topical []  - 0 Application of Medications - injection INTERVENTIONS - Miscellaneous []  - External ear exam 0 []  - 0 Specimen Collection (cultures, biopsies, blood, body fluids, etc.) []  - 0 Specimen(s) / Culture(s) sent or taken to Lab for analysis []  - 0 Patient Transfer (multiple staff / Civil Service fast streamer / Similar devices) []  - 0 Simple Staple / Suture removal (25 or less) []  - 0 Complex Staple / Suture removal (26 or more) []  - 0 Hypo / Hyperglycemic Management (close monitor of Blood Glucose) []  - 0 Ankle / Brachial Index (ABI) - do not check if billed separately X- 1 5 Vital Signs Has the patient been seen at the hospital within the last three years: Yes Total Score: 60 Level Of Care: New/Established - Level 2 Electronic Signature(s) Signed: 07/07/2021 1:53:04 PM By: Levora Dredge Entered By: Levora Dredge on 07/07/2021 12:33:12 [name] ([id number]) -------------------------------------------------------------------------------- Encounter Discharge Information Details Patient Name: Bradley Loa A. Date of Service: 07/07/2021 10:30 AM Medical Record Number: [id number] Patient Account Number: [id number] Date of Birth/Sex: [birth year] (76 y.o. M) Treating RN: Levora Dredge Primary Care Alexandrea Westergard: [name] Other Clinician: Referring Hoby Kawai: [name] Treating Garrett Mitchum/Extender: Yaakov Guthrie in Treatment: 44 Encounter Discharge Information Items Discharge Condition: Stable Ambulatory Status: Wheelchair Discharge Destination:  Home Transportation: Private Auto Accompanied By: self Schedule Follow-up Appointment: Yes Clinical Summary of Care: Electronic Signature(s) Signed: 07/07/2021 12:34:57 PM By: Levora Dredge Entered By: Levora Dredge on 07/07/2021 12:34:57 [name] ([id number]) -------------------------------------------------------------------------------- Lower Extremity Assessment Details Patient Name: Bradley [name], Bradley A. Date of Service: 07/07/2021 10:30 AM Medical Record Number: [id number] Patient Account Number: [id number] Date of Birth/Sex: 04-11-1946 (76 y.o. M) Treating RN: Levora Dredge Primary Care Nikolos Billig: [name] Other Clinician: Referring Ardice Boyan: [name] Treating Kaeden Depaz/Extender: Yaakov Guthrie in Treatment: 103 Electronic Signature(s) Signed: 07/07/2021 1:53:04 PM By: Levora Dredge Entered By: Levora Dredge on 07/07/2021 11:11:50 [name] ([id number]) -------------------------------------------------------------------------------- Multi Wound Chart Details Patient Name: Bradley [name], Bradley A. Date of Service: 07/07/2021 10:30 AM Medical Record Number: [id number] Patient Account Number: [id number] Date of Birth/Sex: 1946-01-29 (76 y.o. M) Treating RN: Levora Dredge Primary Care Adar Rase: [name] Other Clinician: Referring Maribeth Jiles: [name] Treating Talyssa Gibas/Extender: Yaakov Guthrie in Treatment: 103 Vital Signs Height(in): Pulse(bpm): 51 Weight(lbs): Blood Pressure(mmHg): 163/79 Body Mass Index(BMI): Temperature(F): 97.7 Respiratory Rate(breaths/min): 18 Photos: [N/A:N/A] Wound Location: Midline Sacrum N/A N/A Wounding Event: Pressure Injury N/A N/A Primary Etiology: Pressure Ulcer N/A N/A Comorbid History: Anemia, Hypertension, History of N/A N/A pressure wounds, Rheumatoid Arthritis, Paraplegia Date Acquired: 06/07/2015 N/A N/A Weeks of Treatment: 103 N/A N/A Wound Status: Open N/A N/A Wound  Recurrence: No N/A N/A Measurements L x W x D (cm) 11x3x3.5 N/A N/A Area (cm) : 25.918 N/A N/A Volume (cm) : 90.713 N/A N/A % Reduction in Area: 49.70% N/A N/A % Reduction in Volume: 70.70% N/A N/A Starting Position 1 (o'clock): 6 Ending Position 1 (o'clock): 3 Maximum Distance 1 (cm): 1 Starting Position 2 (o'clock): Ending Position 2 (o'clock): Undermining: Yes N/A N/A Classification: Category/Stage IV N/A N/A Exudate Amount: Large N/A N/A Exudate Type: Sanguinous N/A N/A Exudate Color: red N/A N/A Wound Margin: Epibole N/A N/A Granulation Amount: Large (67-100%) N/A N/A Granulation Quality: Red, Pink N/A N/A Necrotic  Amount: Small (1-33%) N/A N/A Exposed Structures: Fat Layer (Subcutaneous Tissue): N/A N/A Yes Muscle: Yes Fascia: No Tendon: No Joint: No Bone: No Epithelialization: None N/A N/A Treatment Notes Bradley [name], Bradley [name] ([id number]) Electronic Signature(s) Signed: 07/07/2021 1:53:04 PM By: Levora Dredge Entered By: Levora Dredge on 07/07/2021 11:16:24 [name] ([id number]) -------------------------------------------------------------------------------- [place] Details Patient Name: Bradley [name], Bradley A. Date of Service: 07/07/2021 10:30 AM Medical Record Number: [id number] Patient Account Number: [id number] Date of Birth/Sex: Nov 03, 1945 (76 y.o. M) Treating RN: Levora Dredge Primary Care Mettie Roylance: [name] Other Clinician: Referring Maddyn Lieurance: [name] Treating Lesleyanne Politte/Extender: Yaakov Guthrie in Treatment: 103 Active Inactive Electronic Signature(s) Signed: 07/07/2021 1:53:04 PM By: Levora Dredge Entered By: Levora Dredge on 07/07/2021 11:16:03 [name] ([id number]) -------------------------------------------------------------------------------- Pain Assessment Details Patient Name: Bradley [name], Bradley A. Date of Service: 07/07/2021 10:30 AM Medical Record Number: [id number] Patient Account Number:  [id number] Date of Birth/Sex: 23-Oct-1945 (76 y.o. M) Treating RN: Levora Dredge Primary Care Caydan Mctavish: [name] Other Clinician: Referring Cathey Fredenburg: [name] Treating Tani Virgo/Extender: Yaakov Guthrie in Treatment: 103 Active Problems Location of Pain Severity and Description of Pain Patient Has Paino Yes Site Locations Pain Management and Medication Current Pain Management: Notes pt states daily generalized pain Electronic Signature(s) Signed: 07/07/2021 1:53:04 PM By: Levora Dredge Entered By: Levora Dredge on 07/07/2021 11:01:52 [name] ([id number]) -------------------------------------------------------------------------------- Patient/Caregiver Education Details Patient Name: Bradley Loa A. Date of Service: 07/07/2021 10:30 AM Medical Record Number: [id number] Patient Account Number: [id number] Date of Birth/Gender: 04-Sep-1945 (76 y.o. M) Treating RN: Levora Dredge Primary Care Physician: [name] Other Clinician: Referring Physician: [name] Treating Physician/Extender: Yaakov Guthrie in Treatment: 72 Education Assessment Education Provided To: Patient Education Topics Provided Wound/Skin Impairment: Handouts: Caring for Your Ulcer Methods: Explain/Verbal Responses: State content correctly Electronic Signature(s) Signed: 07/07/2021 1:53:04 PM By: Levora Dredge Entered By: Levora Dredge on 07/07/2021 12:33:37 [name] ([id number]) -------------------------------------------------------------------------------- Wound Assessment Details Patient Name: Bradley Loa A. Date of Service: 07/07/2021 10:30 AM Medical Record Number: [id number] Patient Account Number: [id number] Date of Birth/Sex: Apr 07, 1946 (76 y.o. M) Treating RN: Levora Dredge Primary Care Breion Novacek: [name] Other Clinician: Referring Najat Olazabal: [name] Treating Talena Neira/Extender: Yaakov Guthrie in  Treatment: 103 Wound Status Wound Number: 10 Primary Pressure Ulcer Etiology: Wound Location: Midline Sacrum Wound Open Wounding Event: Pressure Injury Status: Date Acquired: 06/07/2015 Comorbid Anemia, Hypertension, History of pressure wounds, Weeks Of Treatment: 103 History: Rheumatoid Arthritis, Paraplegia Clustered Wound: No Photos Wound Measurements Length: (cm) 11 % Reduct Width: (cm) 3 % Reduct Depth: (cm) 3.5 Epitheli Area: (cm) 25.918 Tunneli Volume: (cm) 90.713 Undermi Locat St En Ma Locat ion in Area: 49.7% ion in Volume: 70.7% alization: None ng: No ning: Yes ion 1 arting Position (o'clock): 6 ding Position (o'clock): 3 ximum Distance: (cm) 1 ion 2 Wound Description Classification: Category/Stage IV Foul Odo Wound Margin: Epibole Slough/F Exudate Amount: Large Exudate Type: Sanguinous Exudate Color: red r After Cleansing: No ibrino Yes Wound Bed Granulation Amount: Large (67-100%) Exposed Structure Granulation Quality: Red, Pink Fascia Exposed: No Necrotic Amount: Small (1-33%) Fat Layer (Subcutaneous Tissue) Exposed: Yes Necrotic Quality: Adherent Slough Tendon Exposed: No Muscle Exposed: Yes Necrosis of Muscle: No Joint Exposed: No Bone Exposed: No Treatment Notes Bradley [name], Bradley [name] ([id number]) Wound #10 (Sacrum) Wound Laterality: Midline Cleanser Peri-Wound Care Topical Primary Dressing Gauze Discharge Instruction: Moistened with Dakins and packed lightly into wound bed Secondary Dressing zetuvit 6x9 Secured With 62M Medipore H Soft Cloth Surgical Tape, 3x10 (in/yd) Discharge  Instruction: Secure dressing Compression Wrap Compression Stockings Add-Ons Electronic Signature(s) Signed: 07/07/2021 1:53:04 PM By: Levora Dredge Entered By: Levora Dredge on 07/07/2021 11:11:29 [name] ([id number]) -------------------------------------------------------------------------------- Vitals Details Patient Name: Bradley Loa  A. Date of Service: 07/07/2021 10:30 AM Medical Record Number: [id number] Patient Account Number: [id number] Date of Birth/Sex: 09/05/1945 (76 y.o. M) Treating RN: Levora Dredge Primary Care Riel Hirschman: [name] Other Clinician: Referring Aarsh Fristoe: [name] Treating Biran Mayberry/Extender: Yaakov Guthrie in Treatment: 103 Vital Signs Time Taken: 10:52 Temperature (F): 97.7 Pulse (bpm): 65 Respiratory Rate (breaths/min): 18 Blood Pressure (mmHg): 163/79 Reference Range: 80 - 120 mg / dl Electronic Signature(s) Signed: 07/07/2021 1:53:04 PM By: Levora Dredge Entered By: Levora Dredge on 07/07/2021 11:01:35

## 2021-07-08 NOTE — Progress Notes (Signed)
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 change his dressings independently. He admits to sitting in his motorized chair for no more than 2-3 hours at which time he transfers to bed and rotates lateral position. 05/10/16; Shamarcus Hoheisel returns today for review of his stage III sacral pressure ulcer. He denies any concerns over the last 2 weeks although he seems to be running out of Aquacel Ag and on  those days he uses Medihoney. He has [facility] was supplying his dressings. He still complains of drainage. He does his dressings independently. He has in his motorized chair for 2-3 hours that time other than that he offloads this. Dimensions of the wound are down 1 cm in both directions. He underwent an aggressive debridement on his last visit of thick circumferential skin and subcutaneous tissue. It is possible at some point in the future he is going to need this done again 05/24/16; the patient returns today for review of his stage III sacral pressure ulcer. We have been using Aquacel Ag he tells me that he changes this up to twice a day. I'm not really certain  of the reason for this frequency of changing. He has some involvement from the home health nurses but I think is doing most of the changing himself which I think because of his paraplegia would be a very difficult exercise. Nevertheless he states that there is "wetness". I am not sure if there is another dressing that we could easily changed that much. I'd wanted to change to [facility] but I'll need to have a sense of how frequent he would need to change this. TAG, Bradley [name] ([id number]) 06/14/16; this is a patient returns for review of his stage III sacral pressure ulcer. We have been using Aquacel Ag and over the last 2 visits he has had extensive debridement so of the thick circumferential skin and subcutaneous tissue that surrounds this wound. In spite of this really absolutely no change in the condition of the wound warrants measurements. We have [facility] home health I believe changing the dressing on 3 occasions the patient states he does this on one occasion himself 06/28/16; this is a patient who has a fairly large stage III sacral pressure ulcer. I changed him to [facility] from Aquacel 2 weeks ago. He returns today in follow-up. In the meantime a nurse from [facility] has calledrequesting ordering of a wound  VAC. He had this discussion before. The problem is the proximity of the lowest edge of this wound to the patient's anal opening roughly 3/4 of an inch. Can't see how this can be arranged. Apparently the nurse who is calling has a lot of experience, the question would be then when she is not available would be doing this. I would not have thought that this wound is not amenable to a wound VAC because of this reason 07/12/16; the patient comes in today and I have signed orders for a wound VAC. The home health team through [facility] is convinced that he can benefit from this even though there is close proximity to his anal opening beneath the gluteal clefts. The patient does not have a bowel regimen but states he has a bowel movement every 2 days this will also provide some problem with regards to the vac seal 07/26/16; the patient never did obtain a [place] wound VAC as he could not afford the $200 per month co-pay we have been using Hydrofera Blue now for 6 weeks or so. No major change in this wound at all. He is still not interested in the concept of plastic surgery. There changing the dressing every second day 08/09/16; the patient arrives with a wound precisely in the same situation. In keeping with the plan I outlined last time extensive debridement with an open curet the surface of this is not completely viable. Still has some degree of surrounding thick skin and subcutaneous tissue. No evidence of infection. Once again I have had a conversation with him about plastic surgery, he is simply not interested. 08/23/16; wound is really no different. Thick circumferential skin and subcutaneous tissue around the wound edge which is a lot better from debridement we did earlier in the year. The surface of the wound looks viable however with a curet there is definitely a gritty surface to this. We use Medihoney for a while, he could not afford Santyl. I don't think we could get a supply of Iodoflex. He talks a  little more positively about the concept of plastic surgery which I've gone over with him today 08/31/16;; patient arrives in clinic today with the wound surface really no different there  is no changes in dimensions. I debrided today surface on the left upper side of this wound aggressively week ago there is no real change here no evidence of epithelialization. The problem with debridement in the clinic is that he believes from this very liberally. We have been using Sorbact. 09/21/16; absolutely no change in the appearance or measurements of this wound. More recently I've been debrided in this aggressively and using sorbact to see if we could get to a better wound surface. Although this visually looks satisfactory, debridement reveals a very gritty surface to this. However even with this debridement and removal of thick nonviable skin and subcutaneous tissue from around the large amount of the circumference of this wound we have made absolutely no progress. This may be an offloading issue I'm just not completely certain. It has 2 close proximity in its inferior aspect to consider negative pressure therapy 10/26/16; READMISSION This patient called our clinic yesterday to report an odor in his wound. He had been to see plastic surgery at [facility] at our request after his last visit on 09/21/16; we have been seeing him for several months with a large stage III wound. He had been sent to general surgery for consideration of a colostomy, that appointment was not until mid June He comes in today with a temperature of 101. He is reporting an odor in the wound since last weekend. 01/10/17 Readmission: 01/10/17 On evaluation today it is noted that patient has been seen by plastic surgery at [facility] since he was last evaluated here. They did discuss with him the possibility of a flap according to the notes but unfortunately at this point he was not quite ready to proceed with surgery and instead wanted to give the Wound VAC  a try. In the hospital they were able to get a good seal on the Wound VAC. Unfortunately since that time they have been having trouble in regard to his current home health company keeping a simple on the Wound VAC. He would like to switch to a different home health company. With that being said it sounds as if the problem is that his wound VAC is not feeling at the lower portion of his back and he tells me that he can take some of the clear plastic and put over that area when the sill breaks and it will correct it for time. He has no discomfort or pain which is good news. He has been treated with IV vancomycin since he was last seen here and has an appointment with a infectious disease specialist in two days on 01/12/17. Otherwise he was transferred back to [place] for continuing to monitor and manage is wound as she progresses with a Wound VAC for the time being. 01/17/17 on evaluation today patient continues to show evidence of slight improvement with the Wound VAC fortunately there's no evidence of infection or otherwise worsening condition in general. Nonetheless we were unable to get him switch to [facility] in regard to home help from his current company. I'm not sure the reasoning behind but for some reason he was not accepted as a patient with him. Continue to apply the Wound VAC which does still show that some maceration around the wound edges but the wound measurements were slightly improved. No fevers, chills, nausea, or vomiting noted at this time. 02/14/17; this patient I have not seen in 5 months although he has been readmitted to our clinic seen by our physician assistant Jeri Cos twice in early August.  I have looked through [name] notes care everywhere. The patient saw plastic surgery in May [Dr. Bhatt}. The patient was sent to general surgery and ultimately had a colostomy placed. On 11/29/16. This was after he was admitted to [facility] sometime in May. An MRI of the pelvis on 5/23 showed  osteomyelitis of the coccyx. An attempt was made to drain fluid that was not successful. He was treated with empiric broad-spectrum antibiotics VAC/cefepime/Flagyl starting on 11/02/16 with plans for a 6 week course. According to their notes he was sent to a nursing home. Was last seen by Dr. Myriam Jacobson of plastic surgery on 12/28/16. The first part of the note is a long dissertation about the difficulties finding adequate patients for flap closure of pressure ulcers. At that time the wound was noted to be stage IV based I think on underlying infection no exposed bone and healthy granulation tissue. 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 He was last seen by infectious disease 01/12/17 A Dr. Uvaldo Rising. His note says that Mr. Taniguchi was not interested in a flap closure for referring a trial of the wound VAC. As previously anticipated the wound VAC could not be maintained as an outpatient in the community. He is now using something similar to a Dakin's wet to dry recommended by [facility] VASHE solution. He is placing this twice a day himself. This is almost s hopeless setting in terms of heeling 02/28/17; he is using a Dakin's wet to dry. Most of the wound surface looks satisfactory however the deeper area over his coccyx now has exposed bone I'm not sure if I noted this last week. 03/21/17; patient is usingVASHE solution wet to dry which I gather is a variation on Dakin solution. He has home health changing this 3 times a week the other days he does this himself. His appointment with plastic surgery 04/18/17; patient continues to use a variant of Dakin solution I believe. His wound continues to have a clean viable surface. The 2 areas of exposed bone in the center of this wound had closed over. He has an appointment with plastic surgery on December 5 at which time I hope that there'll be a plan for myocutaneous flap closure In looking through [place] link I couldn't  find any more plastic surgery appointments. I did come across the fact that he is been followed by Bradley [name] ([id number]) hematology for a microcytic hypochromic anemia. He had a reasonably normal looking hemoglobin electrophoresis. His iron level was 10 and according to the patient he is going for IV iron infusions starting tomorrow. He had a sedimentation rate of 74. More problematically from a pure wound care point of view his albumin was 2.7 earlier this month 05/17/17; this is a patient I follow monthly. He has a large now stage IV wound over his bilateral buttocks with close proximity to his anal opening. More recently he has developed a large area with exposed bone in the center of this probably secondary to the underlying osteomyelitis E had in the summer. He also follows with Dr. Myriam Jacobson at [facility] who is plastic surgery. He had an appointment earlier this month and according to the patient Dr. Myriam Jacobson does not want to proceed with any attempted flap closure. Although I do not have current access to her note in care everywhere this is likely due to exposed bone. Again according to the patient they did a bone biopsy. He is still using a variant of  Dakin solution changing twice a day. He has home health. The patient is not able to give me a firm answer about how long he spends on this in his wheelchair The patient also states that Dr. Myriam Jacobson wanted to reconsider a wound VAC. I really don't see this as a viable option at least not in the outpatient setting. The wound itself is frankly to close to his anal opening to maintain a seal. The last time we tried to do this home health was unable to manage it. It might be possible to maintain a wound VAC in this setting outside of the home such as a skilled nursing facility or an [place] however I am doubtful about this even in that setting **** READMISSION 09/21/17-He is here for evaluation of stage IV sacral ulcer. Since his last evaluation here in  December he has completed treatment for sacral osteomyelitis. He was at [place] for IV therapy and NPWT dressing changes. He was discharged, with home health services, in February. He admits that while in the skilled facility he had "80%" success with maintaining dressing, since discharge he has had approximately "40%" success with maintaining wound VAC dressing. We discussed at length that this is not a safe or recommended option. We will apply Dakin's wet to dry dressing daily and he will follow-up next week. He is accompanied today by his sister who is willing to assist in dressing changes; they will discuss the social issue as he feels he is capable of changing dressing daily when home health is not able. 09/28/17-He is here in follow-up evaluation for stage IV sacral pressure ulcer. He has been using the Dakin's wet-to-dry daily; he continues with home health. He is not accompanied by anyone at this visit. He will follow up in two weeks per his request/preference. 10/12/17 on evaluation today patient appears to be doing very well. The Dakin solution went to dry packings do seem to be helping him as far as the sacral wound is concerned I'm not seeing anything that has me more concerned as far as infection or otherwise is concerned. Overall I'm pleased with the appearance of the wound. 10/26/17-He is here in follow up evaluation for a stage IV sacral ulcer. He continues with daily Dakin's wet-to-dry. He is voicing no complaints or concerns. He will follow-up in 2 weeks 11/16/17-He is here for follow up evaluation for a palliative stage IV sacral pressure ulcer. We will continue with Dakin's wet-to-dry. He will follow- up in 4 weeks. He is expressing concern/complaint regarding new bed that has arrived, stating he is unable to manipulate/maneuver it due to the bed crank being at the foot of the bed. 12/14/17-He is here for evaluation for palliative stage IV sacral ulcer. He is voicing no  complaints or concerns. We will continue with one-to-one ratio of saline and dakins. He will follow-up in 3 weeks 01/04/18-He is here in follow-up evaluation for palliative stage IV sacral ulcer. He is inquiring about reinstating the negative pressure wound therapy. We discussed at length that the negative pressure wound therapy in the home setting has not been successful for him repeatedly with loss of cereal and unavailability of 24/7 help; reminded him that home health is not available 24/7 when loss of seal occurs. He does verbalize understanding to this and does not pursue. We also discussed the palliative nature of this ulcer (given no significant change/improvement in measurement/appearance, not a candidate for muscle flap per plastic surgery, and continued independent living) and that the goal  is for maintenance, decrease in infection and minimizing/avoiding deterioration given that he is independent in his care, does not have home health and requires daily dressing changes secondary to drainage amount. He is inquiring about a wound clinic in [facility], I have informed him that I am unfamiliar with that clinic but that he is encouraged to seek another opinion if that is his desire. We will continue with dakins and he will follow up in three weeks 01/25/18-He is here in follow-up evaluation for palliative stage IV sacral ulcer. He continues with Dakin's/saline 1:1 mixture wet to dry dressing changes. He states he has an appointment at [facility] on 9/17 for evaluation of surgical intervention/closure of the sacral ulcer. He will follow-up here in 4 weeks Readmission: 07/16/2019 upon evaluation today patient appears to be doing really about the same as when he was previously seen here in the wound care center. He most recently was a patient of [place] back when she was still working here in the center but had been referred to [facility] for consideration of a flap. With that being said the surgeon there at [facility] stated  that this was too large for her flap and they have been attempting to get this smaller in order to be able to proceed with a flap. Nonetheless unfortunately he has had a cycle of going back and forth between the osteomyelitis flaring and then sent him back and then making a little bit of progress only to be sent back again. It sounds like most recently they have been using a Iodoflex type dressing at this point which does not seem to have done any harm by any means. With that being said this wound seems to be quite large for using Iodoflex throughout and subsequently I think he may do much better with the use of Vioxx moistened gauze which would be safe for the new tissue growing and also keep the wound quite nice and clean. The patient is not opposed to this he in fact states that his home health nurse had mentioned this as a possibility as well. 07/23/2019 upon evaluation today patient actually appears to be doing very well in regard to his sacral wound. In fact this is much healthier the measurements not terribly different but again with a wound like this at home necessarily expect a a lot of change as far as the overall measurements are concerned in just 1 week's time. This is going be a much longer term process at this point. With that being said I do think that he is very healthy appearing as far as the base of the wound is concerned. 08/06/2019 upon evaluation today patient actually appears to be doing about the same with regard to his wound to be honest. There is really not a significant improvement overall based on what I am seeing today. Fortunately there is no signs of significant systemic infection. No fevers, chills, nausea, vomiting, or diarrhea. With that being said there is odor to the drainage from the wound and subsequently also what appears to be increased drainage based on what we are seeing today as well as what his home health nurse called [place] about that she was concerned with as  well. 08/13/2019 upon evaluation today patient appears to be doing about the same at this point with regard to his wound although I think the dressing may be a little less drainage wise compared to what it was previous. Fortunately there is no signs of active infection at this time. No fevers,  chills, nausea, vomiting, or diarrhea. He has been taking the antibiotic for only 3 days. 08/23/2019 upon evaluation today patient appears to be doing not nearly as well with regard to his wound. In general really not making a lot of progress which is unfortunate. There does not appear to be any signs of active infection at this time. No fevers, chills, nausea, vomiting, or diarrhea. With that being said the patient unfortunately does seem to be experiencing continued epiboly around the edges of the wound as well as significant scar tissue he has been in the majority of his day sitting in his chair which is likely a big reason for all of this. Fortunately there is no Bradley [name], Bradley A. ([id number]) signs of active infection at this time. No fevers, chills, nausea, vomiting, or diarrhea. 08/29/2019 upon evaluation today patient's wound actually appears to be showing some signs of improvement. The region of few areas of new skin growth around the edges of the wound even where he has some of the epiboly. This is actually good news and I think that he has been very aggressive and offloading over the past week since we showed him the pictures of his wound last week. There still may be a chance that he is going require some sharp debridement to clear away some of these edges but to be honest I think that is can be quite an undertaking. The main reason is that he has a lot of thickened scar tissue and it is good to bleed quite significantly in my opinion. The I think if organ to do that we may even need to have a portable or disposable cautery in order to make sure that we are able to get the area completely sealed up as far  as bleeding is concerned. I do have one that we can utilize. However being that the wound looks so much better right now would like to give this 2 weeks to see how things stand and look at that point before making any additional recommendations. 09/12/2019 upon evaluation today patient appears to be making some progress as far as offloading is concerned. He still has a substantial wound but nonetheless he tells me he is off loading much more aggressively at this point. This is obviously good news. No fevers, chills, nausea, vomiting, or diarrhea. 4/22; I have not seen this patient in quite some time. No major change in the condition of the wound or its wound volume. Surrounding maceration of the skin around the edges of the wound. The wound is fairly substantial. To close to the anal opening to consider a wound VAC. He had extensive trials of plastic surgery at [facility] which really does not result in any improvement. His wound bed is however clean 10/10/2019 upon evaluation today patient appears to be doing well with regard to the wound in the sacral region. He has been tolerating the dressing changes without complication. Fortunately there is no signs of infection and he actually has some epithelial growth noted as well. He tells me has been trying to continue to offload this area which is excellent that is what he needs to do most as far as what he can have in his control. Otherwise I feel like the collagen with a saline moistened gauze behind has been beneficial for him. 5/20; collagen and normal saline wet-to-dry. Although the tissue when this large sacral wound looks reasonable there is been absolutely no benefit to the overall closure. He has macerated skin around this.  He tells me that he spends a large part of the day up in the wheelchair. He still has home health 11/07/2019 upon evaluation today patient appears to be doing well with regard to his wounds at this point. Fortunately there is  no signs of active infection at this time. Overall I feel like his sacral wound in general appears to be doing quite nicely. I am very pleased with overall how things are progressing and I feel like the patient is making excellent progress towards resolution. With that being said he still has a long ways to go before we expect to see this completely healed. 11/21/2019 upon evaluation today patient appears to be doing about the same in regard to his sacral wound. Unfortunately he is really not making much in the way of improvement when I questioned him about how much time he is really spending sitting on his bottom he tells me that the majority of his day is mainly in that position though he does spend some time in bed. It does not sound like it is enough however neither does it look like it is enough. 12/24/2019 upon evaluation today patient appears to be doing really about the same there is no significant improvement overall in his wound bed. Last time he was seen here we did have a discussion about the possibility of seeing if I can get a surgeon to evaluate and potentially clear away some of the thickened edges of the wound in order to allow this wound to potentially have a chance of closing with a wound VAC. Right now we could not even get a wound VAC to seal with some of the rolled and thickened edges. With that being said elected to the patient to think about this to discuss today. At this point the patient does want to see if I could make the referral to potentially see if the surgeon would be able to perform this and subsequently he agrees to go to a skilled nursing facility following if they are able to do this in order to allow for a wound VAC and to get this wound to heal more effectively and quickly. In my opinion the only way that I would recommend that he see the surgeon to clear away the edges would be if he is also in agreement with going to skilled nursing facility as I know he cannot  appropriately offload at home to take care of this and that setting. 01/07/2020 on evaluation today patient appears to be doing about the same in regard to his wound. There is no signs of active infection at this time which is great news. All in all he is really doing about the same. We have made referral to surgery to see if they can potentially perform debridement to clear away some of the scarred edging of the wound so we could potentially see about a wound VAC to try to help this heal more efficiently. Again we have discussed that if this occurs he would be in a skilled nursing facility following or release would need to be in order to appropriately offload. Nonetheless we are still in the process of working that referral. 01/21/2020 upon evaluation today patient appears to be doing about the same in regard to his sacral wound. He did see Dr. Lysle Pearl Dr. Ines Bloomer feeling was that there was not anything surgically he could offer at this point as he did not feel like that a wound VAC would likely be appropriate for the patient. With  that being said he did recommend potential for trying to get the patient into a skilled nursing facility as an outpatient and they will get me working on that as well. Fortunately there is no signs of active infection at this time. 11/10; this is a patient that has not been seen here in quite a bit of time while we were in our alternative venue. As well I have not seen this patient in probably over 2 years. As I remember things with him he has had this pressure ulcer encompassing his lower sacrum and coccyx and the surrounding buttocks for a prolonged period of time. When he first came here the surface looked fairly good and the wound was smaller we attempted a wound VAC but we could never keep this in place. Because of nonresponse of the wound to topical dressings we referred him to [facility] plastic surgery where preparation was being made for flap closure. Intact he  had a colostomy however after that he was deemed to have 2 large wound for flap closure. He did have osteomyelitis in the coccyx area in 2019. He is using wet-to-dry he has home health. They are managing his ostomy supplies as well. The wound is substantially larger than what I remember. Versus last time he was here to wound months or so ago he has an unstageable wound on the left upper thigh posteriorly. He tells me he is in his chair quite a bit especially recently 12/1; patient comes back to clinic. He did not get the lab work done apparently home health would not do this they referred him to the lab where he is apparently having a CBC checked because he is on iron infusions. We will try to get the lab work I ordered done there. He also did not get the x-ray he says he does not remember the discussion. The issue is that this large wound has probing areas to bone. He may have underlying osteomyelitis again he was treated for this previously I think in 2019 12/15; the patient did have lab work done. Notable for iron deficiency anemia with a hemoglobin of 9.7 although he is following with hematology for this his CRP was 7.1 which is up from his previous value. sedimentation rate of 91 which is also higher. However he maintains a relatively high sedimentation rate. Most disturbingly was an albumin of 2.8 although this is also somewhat higher than the 2.2 we had previously. With some difficulty I think a home health nurse actually made him some Dakin's solution he is using a Dakin's wet-to-dry to the large sacral wound. He has the area on the left upper thigh as well Bradley [name], Bradley [name] ([id number]) 06/17/2020; I follow this patient on a monthly basis. 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 At one point he had underlying osteomyelitis here and he received antibiotic therapy. I believe this was done through [facility] We have been using palliative Dakin's solution wet-to-dry on this  which is really This very large wound about the same with healthy surface. No active infection that is obvious. He has developed an area on the left posterior thigh and now a mirror image area on the right posterior thigh noted pressure injuries from the wheelchair. This occurs even though he has a Roho cushion which is apparently new I have been forwarded lab work from 05/15/2020. At that point his hemoglobin was 9.2 down from 13.8 on 10/5 this is microcytic hypochromic. He has known  iron deficiency although the exact cause of this is never really been clear to me. He follows with hematology oncology and has been receiving IV iron he is supposed to follow-up in mid January and I told the patient this. From wound care point of view his albumin is 2.9 sedimentation rate is 74. His sedimentation rate has always been elevated in this range however his C-reactive protein was 82 which is way above anything previously measured. I see that on 05/08/2020 this was 7.1 I am not sure why the rapid discrepancy. He is supposed to have an x-ray of his lower sacrum looking for evidence of osteomyelitis that he was previously treated for. Is possible he would also require more advanced imaging such as another C CT or MRI He comes in also with paperwork for a Cl[place]ron air-fluidized bed with a trapeze bar. I asked him if he would be able to transfer in and out of this bed . I am not opposed to ordering it, but I do not want to make his transfers more difficult than they already are. He has a [employer] fast streamer 07/15/2020; he is managed to get his Cl[place]ron air-fluidized bed. He has a new electric wheelchair with a wheelchair cushion. The x-ray I did showed some sacral bone destruction although this did not seem progressive him last time. He was treated for osteomyelitis in this area previously in 2018. His sedimentation rate is 91 CRP 7.1 albumin 2.8. We are using Dakin's wet-to-dry. He has [facility] home health 4/6; 78-month  follow-up. He has a Cl[place]ron air-fluidized bed. He has the wheelchair cushion. His major wound on the coccyx looks somewhat better including including both of [place] my nurse and my feeling that it is closed in somewhat. Wound is still too close to his anal opening to consider a wound VAC. At 1 point we were trying to get him into [facility] to get a flap closure but for 1 reason or another this never moved forward I was never really certain what the issue was. He has an area on the left upper thigh in the gluteal fold. This is a clean wound. 6/1; 50-month follow-up. I follow this man on a palliative basis he has a chronic stage IV wound over the lower sacrum coccyx and surrounding buttocks. He has an air-fluidized bed and a wheelchair cushion but he spends more time in the wheelchair than he does off this area by his own admission. I do not think the area is really changed that much although it is probably deeper in 1 section. He does tell us that he was discharged from home health because of lack of change of the wound. This was [employer]. He was treated for underlying osteomyelitis several years ago and he may have underlying chronic osteomyelitis. I do not believe he ever got the x-ray I ordered 8/3; patient presents for his 45-month follow-up. He reports no issues or complaints today. He continues to use Dakin's wet-to-dry dressings to his sacrum and silver alginate to his left lateral thigh wound. He denies signs of infection. 10/5; patient presents for 73-month follow-up. He has no issues or complaints today. He continues to use Dakin's wet-to-dry dressings to his sacrum. He states that the left lateral thigh wound is healed. He denies signs of infection. 12/7; patient presents for 79-month follow-up. He states he would like to obtain a new air loss mattress bed. He states his current one is having technical difficulties. He currently is using Dakin's wet-to-dry dressings. He denies  signs of  infection. 2/1; patient presents for follow-up. He has no issues or complaints today. He currently uses Dakin's wet-to-dry dressings. He denies signs of infection. He has his new air loss mattress. Objective Constitutional Vitals Time Taken: 10:52 AM, Temperature: 97.7 F, Pulse: 65 bpm, Respiratory Rate: 18 breaths/min, Blood Pressure: 163/79 mmHg. General Notes: Sacral ulcer: Granulation tissue throughout. Slight pale appearance. No probing to bone. No obvious signs of infection. Integumentary (Hair, Skin) Wound #10 status is Open. Original cause of wound was Pressure Injury. The date acquired was: 06/07/2015. The wound has been in treatment 103 weeks. The wound is located on the Midline Sacrum. The wound measures 11cm length x 3cm width x 3.5cm depth; 25.918cm^2 area and 90.713cm^3 volume. There is muscle and Fat Layer (Subcutaneous Tissue) exposed. There is no tunneling noted, however, there is undermining starting at 6:00 and ending at 3:00 with a maximum distance of 1cm. There is additional undermining and at :00 and ending at :00. There is a large amount of sanguinous drainage noted. The wound margin is epibole. There is large (67-100%) red, pink granulation within the wound bed. There is a small (1-33%) amount of necrotic tissue within the wound bed including Adherent Slough. 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 He has a history of chronic osteomyelitis and this is a palliative wound care case. I recommended continuing Dakin's wet-to-dry dressings and aggressive offloading. He had paperwork to help with transportation assistance and I filled this out In office and given back to the patient. Follow-up in 2 months. Patient knows to call with any questions or  concerns and can be seen sooner. Plan Follow-up Appointments: Return Appointment in: - 2 months Bathing/ Shower/ Hygiene: May shower; gently cleanse wound with antibacterial soap, rinse and pat dry prior to dressing wounds Off-Loading: Hospital bed/mattress Air fluidized (Group 3) Turn and reposition every 2 hours Additional Orders / Instructions: Follow Nutritious Diet and Increase Protein Intake WOUND #10: - Sacrum Wound Laterality: Midline Primary Dressing: Gauze (Generic) 1 x Per Day/30 Days Discharge Instructions: Moistened with Dakins and packed lightly into wound bed Secondary Dressing: zetuvit 6x9 1 x Per Day/30 Days Secured With: 4M Medipore H Soft Cloth Surgical Tape, 3x10 (in/yd) (Generic) 1 x Per Day/30 Days Discharge Instructions: Secure dressing 1. Dakin's wet-to-dry dressings 2. Aggressive offloading Electronic Signature(s) Signed: 07/08/2021 4:53:16 PM By: Kalman Shan DO Entered By: Kalman Shan on 07/08/2021 16:51:41 Bradley [name] ([id number]) -------------------------------------------------------------------------------- SuperBill Details Patient Name: [name] Date of Service: 07/07/2021 Medical Record Number: [id number] Patient Account Number: [id number] Date of Birth/Sex: 1945-09-01 (76 y.o. 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 Facility Procedures CPT4 Code: [id number] Description: 727 [id number] 3788 - WOUND CARE VISIT-LEV 2 EST PT Modifier: Quantity: 1 Physician Procedures CPT4 Code: 4196222 Description: 97989 - WC PHYS LEVEL 3 - EST  PT Modifier: Quantity: 1 CPT4 Code: Description: ICD-10 Diagnosis Description L89.154 Pressure ulcer of sacral region, stage 4 M86.68 Other chronic osteomyelitis, other site G82.20 Paraplegia, unspecified Modifier: Quantity: Electronic Signature(s) Signed: 07/08/2021 4:53:16 PM By: Kalman Shan  DO Previous Signature: 07/07/2021 12:33:20 PM Version By: Levora Dredge Entered By: Kalman Shan on 07/08/2021 16:52:20

## 2021-07-19 DIAGNOSIS — M869 Osteomyelitis, unspecified: Secondary | ICD-10-CM | POA: Diagnosis not present

## 2021-07-19 DIAGNOSIS — G8220 Paraplegia, unspecified: Secondary | ICD-10-CM | POA: Diagnosis not present

## 2021-07-19 DIAGNOSIS — L89154 Pressure ulcer of sacral region, stage 4: Secondary | ICD-10-CM | POA: Diagnosis not present

## 2021-08-09 ENCOUNTER — Other Ambulatory Visit: Payer: Self-pay | Admitting: *Deleted

## 2021-08-09 DIAGNOSIS — D500 Iron deficiency anemia secondary to blood loss (chronic): Secondary | ICD-10-CM

## 2021-08-16 ENCOUNTER — Other Ambulatory Visit: Payer: Self-pay

## 2021-08-16 ENCOUNTER — Inpatient Hospital Stay: Payer: Medicare HMO | Attending: Nurse Practitioner

## 2021-08-16 DIAGNOSIS — D509 Iron deficiency anemia, unspecified: Secondary | ICD-10-CM | POA: Insufficient documentation

## 2021-08-16 DIAGNOSIS — L89154 Pressure ulcer of sacral region, stage 4: Secondary | ICD-10-CM | POA: Diagnosis not present

## 2021-08-16 DIAGNOSIS — D500 Iron deficiency anemia secondary to blood loss (chronic): Secondary | ICD-10-CM

## 2021-08-16 DIAGNOSIS — M869 Osteomyelitis, unspecified: Secondary | ICD-10-CM | POA: Diagnosis not present

## 2021-08-16 DIAGNOSIS — G8220 Paraplegia, unspecified: Secondary | ICD-10-CM | POA: Diagnosis not present

## 2021-08-16 LAB — IRON AND TIBC
Iron: 41 ug/dL — ABNORMAL LOW (ref 45–182)
Saturation Ratios: 14 % — ABNORMAL LOW (ref 17.9–39.5)
TIBC: 287 ug/dL (ref 250–450)
UIBC: 246 ug/dL

## 2021-08-16 LAB — CBC WITH DIFFERENTIAL/PLATELET
Abs Immature Granulocytes: 0.02 10*3/uL (ref 0.00–0.07)
Basophils Absolute: 0 10*3/uL (ref 0.0–0.1)
Basophils Relative: 0 %
Eosinophils Absolute: 0.2 10*3/uL (ref 0.0–0.5)
Eosinophils Relative: 3 %
HCT: 39.6 % (ref 39.0–52.0)
Hemoglobin: 12.5 g/dL — ABNORMAL LOW (ref 13.0–17.0)
Immature Granulocytes: 0 %
Lymphocytes Relative: 28 %
Lymphs Abs: 2.5 10*3/uL (ref 0.7–4.0)
MCH: 24.9 pg — ABNORMAL LOW (ref 26.0–34.0)
MCHC: 31.6 g/dL (ref 30.0–36.0)
MCV: 78.7 fL — ABNORMAL LOW (ref 80.0–100.0)
Monocytes Absolute: 0.6 10*3/uL (ref 0.1–1.0)
Monocytes Relative: 7 %
Neutro Abs: 5.5 10*3/uL (ref 1.7–7.7)
Neutrophils Relative %: 62 %
Platelets: 289 10*3/uL (ref 150–400)
RBC: 5.03 MIL/uL (ref 4.22–5.81)
RDW: 17.1 % — ABNORMAL HIGH (ref 11.5–15.5)
WBC: 8.8 10*3/uL (ref 4.0–10.5)
nRBC: 0 % (ref 0.0–0.2)

## 2021-08-16 LAB — FERRITIN: Ferritin: 161 ng/mL (ref 24–336)

## 2021-08-19 ENCOUNTER — Inpatient Hospital Stay: Payer: Medicare HMO

## 2021-08-19 ENCOUNTER — Inpatient Hospital Stay (HOSPITAL_BASED_OUTPATIENT_CLINIC_OR_DEPARTMENT_OTHER): Payer: Medicare HMO | Admitting: Nurse Practitioner

## 2021-08-19 ENCOUNTER — Other Ambulatory Visit: Payer: Self-pay

## 2021-08-19 ENCOUNTER — Encounter: Payer: Self-pay | Admitting: Nurse Practitioner

## 2021-08-19 ENCOUNTER — Ambulatory Visit: Payer: Medicare HMO | Admitting: Oncology

## 2021-08-19 VITALS — BP 145/70 | HR 75 | Temp 98.7°F | Resp 19

## 2021-08-19 DIAGNOSIS — D509 Iron deficiency anemia, unspecified: Secondary | ICD-10-CM | POA: Diagnosis not present

## 2021-08-19 DIAGNOSIS — D500 Iron deficiency anemia secondary to blood loss (chronic): Secondary | ICD-10-CM

## 2021-08-19 NOTE — Progress Notes (Signed)
?Hematology/Oncology Follow Up Note ?[facility] ?[phone] [phone] ? ?CONSULT NOTE ?Patient Care Team: ?[name], MD as PCP - General (Family Medicine) ? ?CHIEF COMPLAINTS/PURPOSE OF CONSULTATION:  ?Anemia ? ?HISTORY OF PRESENTING ILLNESS:  ?Bradley [name] 76 y.o.  male with PMH listed as below who was referred to me for evaluation of iron deficiency anemia.  ? ?Patient is paraplegic, also has chronic pressure ulcer, recently just had colostomy. He self catheterizes. Patient feels that his energy levels have been the same. He always has mild fatigue.  ? ?He has BPH for which he take finasteride. He has not see his urologist for a while.  ?He was seen by [facility] for elevated ESR and his chronic hand joint tenderness. He was not considered to have rheumatoid arthritis.  ? ?INTERVAL HISTORY. Patient is 76 year old male who returns to clinic for follow up for iron deficiency anemia. He continues to see wound care. Energy is stable. No complaints.  ? ?ROS:  ?Review of Systems  ?Constitutional:  Negative for fatigue.  ?HENT:  Negative.  Negative for lump/mass and mouth sores.   ?Eyes: Negative.  Negative for eye problems.  ?Respiratory: Negative.  Negative for chest tightness and cough.   ?Cardiovascular: Negative.  Negative for chest pain.  ?Gastrointestinal: Negative.  Negative for abdominal distention and abdominal pain.  ?     Colostomy  ?Endocrine: Negative.  Negative for hot flashes.  ?Genitourinary:  Negative for hematuria.   ?     Self catherize  ?Musculoskeletal:  Positive for arthralgias. Negative for back pain.  ?     Paraplegic with chronic spasticity of lower extremities  ?Skin:  Negative for itching and rash.  ?     Chronic pressure ulcer  ?Neurological: Negative.  Negative for dizziness.  ?Hematological: Negative.  Negative for adenopathy.  ?Psychiatric/Behavioral: Negative.  The patient is not nervous/anxious.   ? ?MEDICAL HISTORY:  ?Past Medical  History:  ?Diagnosis Date  ? Anemia   ? Arthritis   ? Paralysis of both lower limbs ([place])   ? Sacral decubitus ulcer   ? ? ?SURGICAL HISTORY: ?Past Surgical History:  ?Procedure Laterality Date  ? COLONOSCOPY  06/06/2014  ? INCISION AND DRAINAGE OF WOUND N/A 06/04/2018  ? Procedure: IRRIGATION AND DEBRIDEMENT WOUND;  Surgeon: Olean Ree, MD;  Location: [facility] ORS;  Service: General;  Laterality: N/A;  ? TONSILLECTOMY    ? ? ?SOCIAL HISTORY: ?Social History  ? ?Socioeconomic History  ? Marital status: Widowed  ?  Spouse name: Not on file  ? Number of children: Not on file  ? Years of education: Not on file  ? Highest education level: Not on file  ?Occupational History  ? Not on file  ?Tobacco Use  ? Smoking status: Former  ?  Packs/day: 0.50  ?  Years: 5.00  ?  Pack years: 2.50  ?  Types: Cigarettes  ?  Quit date: 89  ?  Years since quitting: 51.2  ? Smokeless tobacco: Never  ?Vaping Use  ? Vaping Use: Never used  ?Substance and Sexual Activity  ? Alcohol use: No  ? Drug use: No  ? Sexual activity: Not on file  ?Other Topics Concern  ? Not on file  ?Social History Narrative  ? Not on file  ? ?Social Determinants of Health  ? ?Financial Resource Strain: Not on file  ?Food Insecurity: Not on file  ?Transportation Needs: Not on file  ?Physical Activity: Not on file  ?Stress:  Not on file  ?Social Connections: Not on file  ?Intimate Partner Violence: Not on file  ? ? ?FAMILY HISTORY: ?Family History  ?Problem Relation Age of Onset  ? Breast cancer Mother   ? ? ?ALLERGIES:  has No Known Allergies. ? ?MEDICATIONS:  ?Current Outpatient Medications  ?Medication Sig Dispense Refill  ? acetaminophen (TYLENOL) 500 MG tablet Take 1,000 mg by mouth 3 (three) times daily.     ? amLODipine (NORVASC) 5 MG tablet Take 5 mg by mouth daily.    ? finasteride (PROSCAR) 5 MG tablet Take 5 mg by mouth daily.    ? Multiple Vitamin (MULTI-VITAMINS) TABS Take 1 tablet by mouth daily.     ? polyethylene glycol powder (GLYCOLAX/MIRALAX)  powder Take 17 g by mouth daily as needed for mild constipation.     ? vitamin C (ASCORBIC ACID) 500 MG tablet Take 500 mg by mouth 2 (two) times daily.    ? ?No current facility-administered medications for this visit.  ?. ? ?PHYSICAL EXAMINATION: ?ECOG PERFORMANCE STATUS: 2 - Symptomatic, <50% confined to bed ?Vitals:  ? 08/19/21 1459  ?BP: (!) 145/70  ?Pulse: 75  ?Resp: 19  ?Temp: 98.7 ?F (37.1 ?C)  ?SpO2: 95%  ? ?There were no vitals filed for this visit. ? ?Physical Exam ?Constitutional:   ?   Appearance: He is not ill-appearing.  ?Eyes:  ?   General: No scleral icterus. ?Cardiovascular:  ?   Rate and Rhythm: Normal rate and regular rhythm.  ?Pulmonary:  ?   Effort: Pulmonary effort is normal.  ?Abdominal:  ?   General: There is no distension.  ?   Palpations: Abdomen is soft.  ?   Tenderness: There is no abdominal tenderness. There is no guarding.  ?Skin: ?   General: Skin is warm and dry.  ?Neurological:  ?   Mental Status: He is alert. Mental status is at baseline.  ?Psychiatric:     ?   Mood and Affect: Mood normal.     ?   Behavior: Behavior normal.  ? ? ?LABORATORY DATA:  ?I have reviewed the data as listed ?Lab Results  ?Component Value Date  ? WBC 8.8 08/16/2021  ? HGB 12.5 (L) 08/16/2021  ? HCT 39.6 08/16/2021  ? MCV 78.7 (L) 08/16/2021  ? PLT 289 08/16/2021  ? ?Iron/TIBC/Ferritin/ %Sat ?   ?Component Value Date/Time  ? IRON 41 (L) 08/16/2021 1315  ? TIBC 287 08/16/2021 1315  ? FERRITIN 161 08/16/2021 1315  ? IRONPCTSAT 14 (L) 08/16/2021 1315  ? ? ? ?ASSESSMENT & PLAN:  ?No diagnosis found. ? ?Anemia- Multifactorial, likely secondary to iron deficiency anemia and chronic inflammation. Labs are reviewed and discussed with patient. Hemoglobin continues to improve, now 12.5. Persistent microcytosis however. Ferritin 161, improved. Iron sat improved to 14%. Normal TIBC. No recommendation for IV iron at this time. Continue oral iron if tolerating. Discussed every other day dosing.  ?2. Thrombocytosis likely  due to iron deficiency.  Resolved ? ?All questions were answered. The patient knows to call the clinic with any problems questions or concerns. ? ?Return of visit:  ?3 mo- labs (cbc, ferritin, iron & TIBC) ?Dr. Tasia Catchings, +/- venofer few days later- la ? ?Beckey Rutter, DNP, AGNP-C ?[place] [facility] ?[phone] (clinic) ?08/19/2021 ? ?

## 2021-09-08 ENCOUNTER — Encounter: Payer: Medicare HMO | Attending: Internal Medicine | Admitting: Internal Medicine

## 2021-09-08 DIAGNOSIS — M8668 Other chronic osteomyelitis, other site: Secondary | ICD-10-CM

## 2021-09-08 DIAGNOSIS — G8220 Paraplegia, unspecified: Secondary | ICD-10-CM

## 2021-09-08 DIAGNOSIS — L89154 Pressure ulcer of sacral region, stage 4: Secondary | ICD-10-CM

## 2021-09-08 DIAGNOSIS — L97128 Non-pressure chronic ulcer of left thigh with other specified severity: Secondary | ICD-10-CM | POA: Insufficient documentation
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
## 2021-09-09 NOTE — Progress Notes (Addendum)
Bradley [name], Bradley [name] ([id number]) ?Visit Report for 09/08/2021 ?Arrival Information Details ?Patient Name: Bradley [name], Bradley [name]. ?Date of Service: 09/08/2021 10:30 AM ?Medical Record Number: [id number] ?Patient Account Number: [id number] ?Date of Birth/Sex: 07/09/1945 (76 y.o. M) ?Treating RN: Cornell Barman ?Primary Care Ramiz Turpin: [name] Other Clinician: ?Referring Alrick Cubbage: [name] ?Treating Reighlyn Elmes/Extender: Kalman Shan ?Weeks in Treatment: 112 ?Visit Information History Since Last Visit ?Added or deleted any medications: No ?Patient Arrived: Wheel Chair ?Has Dressing in Place as Prescribed: Yes ?Arrival Time: 11:04 ?Pain Present Now: No ?Accompanied By: self ?Transfer Assistance: Civil Service fast streamer ?Patient Identification Verified: Yes ?Secondary Verification Process Completed: Yes ?Patient Requires Transmission-Based Precautions: No ?Patient Has Alerts: No ?Electronic Signature(s) ?Signed: 09/09/2021 9:05:44 AM By: Gretta Cool, BSN, RN, CWS, Kim RN, BSN ?Entered By: Gretta Cool, BSN, RN, CWS, Kim on 09/08/2021 11:04:45 ?Bradley [name], Bradley [name] ([id number]) ?-------------------------------------------------------------------------------- ?Clinic Level of Care Assessment Details ?Patient Name: JARAE, PANAS. ?Date of Service: 09/08/2021 10:30 AM ?Medical Record Number: [id number] ?Patient Account Number: [id number] ?Date of Birth/Sex: [birth year] (76 y.o. M) ?Treating RN: Cornell Barman ?Primary Care Emir Nack: [name] Other Clinician: ?Referring Carden Teel: [name] ?Treating Callista Hoh/Extender: Kalman Shan ?Weeks in Treatment: 112 ?Clinic Level of Care Assessment Items ?TOOL 4 Quantity Score ?'[]'$  - Use when only an EandM is performed on FOLLOW-UP visit 0 ?ASSESSMENTS - Nursing Assessment / Reassessment ?X - Reassessment of Co-morbidities (includes updates in patient status) 1 10 ?X- 1 5 ?Reassessment of Adherence to Treatment Plan ?ASSESSMENTS - Wound and Skin Assessment / Reassessment ?X - Simple Wound Assessment /  Reassessment - one wound 1 5 ?'[]'$  - 0 ?Complex Wound Assessment / Reassessment - multiple wounds ?'[]'$  - 0 ?Dermatologic / Skin Assessment (not related to wound area) ?ASSESSMENTS - Focused Assessment ?'[]'$  - Circumferential Edema Measurements - multi extremities 0 ?'[]'$  - 0 ?Nutritional Assessment / Counseling / Intervention ?'[]'$  - 0 ?Lower Extremity Assessment (monofilament, tuning fork, pulses) ?'[]'$  - 0 ?Peripheral Arterial Disease Assessment (using hand held doppler) ?ASSESSMENTS - Ostomy and/or Continence Assessment and Care ?'[]'$  - Incontinence Assessment and Management 0 ?'[]'$  - 0 ?Ostomy Care Assessment and Management (repouching, etc.) ?PROCESS - Coordination of Care ?X - Simple Patient / Family Education for ongoing care 1 15 ?'[]'$  - 0 ?Complex (extensive) Patient / Family Education for ongoing care ?X- 1 10 ?Staff obtains Consents, Records, Test Results / Process Orders ?'[]'$  - 0 ?Staff telephones HHA, Nursing Homes / Clarify orders / etc ?'[]'$  - 0 ?Routine Transfer to another Facility (non-emergent condition) ?'[]'$  - 0 ?Routine Hospital Admission (non-emergent condition) ?'[]'$  - 0 ?New Admissions / Biomedical engineer / Ordering NPWT, Apligraf, etc. ?'[]'$  - 0 ?Emergency Hospital Admission (emergent condition) ?X- 1 10 ?Simple Discharge Coordination ?'[]'$  - 0 ?Complex (extensive) Discharge Coordination ?PROCESS - Special Needs ?'[]'$  - Pediatric / Minor Patient Management 0 ?'[]'$  - 0 ?Isolation Patient Management ?'[]'$  - 0 ?Hearing / Language / Visual special needs ?'[]'$  - 0 ?Assessment of Community assistance (transportation, D/C planning, etc.) ?'[]'$  - 0 ?Additional assistance / Altered mentation ?'[]'$  - 0 ?Support Surface(s) Assessment (bed, cushion, seat, etc.) ?INTERVENTIONS - Wound Cleansing / Measurement ?Bradley [name], Bradley [name] ([id number]) ?X- 1 5 ?Simple Wound Cleansing - one wound ?'[]'$  - 0 ?Complex Wound Cleansing - multiple wounds ?X- 1 5 ?Wound Imaging (photographs - any number of wounds) ?'[]'$  - 0 ?Wound Tracing (instead of  photographs) ?X- 1 5 ?Simple Wound Measurement - one wound ?'[]'$  - 0 ?Complex Wound Measurement - multiple wounds ?INTERVENTIONS - Wound Dressings ?'[]'$  - Small Wound Dressing one  or multiple wounds 0 ?X- 1 15 ?Medium Wound Dressing one or multiple wounds ?'[]'$  - 0 ?Large Wound Dressing one or multiple wounds ?'[]'$  - 0 ?Application of Medications - topical ?'[]'$  - 0 ?Application of Medications - injection ?INTERVENTIONS - Miscellaneous ?'[]'$  - External ear exam 0 ?'[]'$  - 0 ?Specimen Collection (cultures, biopsies, blood, body fluids, etc.) ?'[]'$  - 0 ?Specimen(s) / Culture(s) sent or taken to Lab for analysis ?X- 1 10 ?Patient Transfer (multiple staff / Civil Service fast streamer / Similar devices) ?'[]'$  - 0 ?Simple Staple / Suture removal (25 or less) ?'[]'$  - 0 ?Complex Staple / Suture removal (26 or more) ?'[]'$  - 0 ?Hypo / Hyperglycemic Management (close monitor of Blood Glucose) ?'[]'$  - 0 ?Ankle / Brachial Index (ABI) - do not check if billed separately ?X- 1 5 ?Vital Signs ?Has the patient been seen at the hospital within the last three years: Yes ?Total Score: 100 ?Level Of Care: New/Established - Level ?3 ?Electronic Signature(s) ?Signed: 09/09/2021 4:47:54 PM By: Gretta Cool, BSN, RN, CWS, Kim RN, BSN ?Entered By: Gretta Cool, BSN, RN, CWS, Kim on 09/09/2021 09:33:07 ?Bradley [name], Bradley [name] ([id number]) ?-------------------------------------------------------------------------------- ?Encounter Discharge Information Details ?Patient Name: Bradley [name], Bradley [name]. ?Date of Service: 09/08/2021 10:30 AM ?Medical Record Number: [id number] ?Patient Account Number: [id number] ?Date of Birth/Sex: May 29, 1946 (76 y.o. M) ?Treating RN: Cornell Barman ?Primary Care Silvanna Ohmer: [name] Other Clinician: ?Referring Darwyn Ponzo: [name] ?Treating Koby Hartfield/Extender: Kalman Shan ?Weeks in Treatment: 112 ?Encounter Discharge Information Items ?Discharge Condition: Stable ?Ambulatory Status: Wheelchair ?Discharge Destination: Home ?Transportation: Private Auto ?Accompanied By:  self ?Schedule Follow-up Appointment: Yes ?Clinical Summary of Care: ?Notes ?late entry ?Electronic Signature(s) ?Signed: 09/09/2021 9:41:49 AM By: Gretta Cool, BSN, RN, CWS, Kim RN, BSN ?Entered By: Gretta Cool, BSN, RN, CWS, Kim on 09/09/2021 09:41:49 ?Bradley [name], Bradley [name] ([id number]) ?-------------------------------------------------------------------------------- ?Lower Extremity Assessment Details ?Patient Name: Bradley [name], Bradley [name]. ?Date of Service: 09/08/2021 10:30 AM ?Medical Record Number: [id number] ?Patient Account Number: [id number] ?Date of Birth/Sex: [birth year] (76 y.o. M) ?Treating RN: Cornell Barman ?Primary Care Neli Fofana: [name] Other Clinician: ?Referring Estefania Kamiya: [name] ?Treating Jonte Shiller/Extender: Kalman Shan ?Weeks in Treatment: 112 ?Electronic Signature(s) ?Signed: 09/09/2021 9:05:44 AM By: Gretta Cool, BSN, RN, CWS, Kim RN, BSN ?Entered By: Gretta Cool, BSN, RN, CWS, Kim on 09/08/2021 11:13:54 ?Bradley [name], Bradley [name] ([id number]) ?-------------------------------------------------------------------------------- ?Multi Wound Chart Details ?Patient Name: Bradley [name], Bradley [name]. ?Date of Service: 09/08/2021 10:30 AM ?Medical Record Number: [id number] ?Patient Account Number: [id number] ?Date of Birth/Sex: 1945/09/08 (76 y.o. M) ?Treating RN: Cornell Barman ?Primary Care Duron Meister: [name] Other Clinician: ?Referring Angline Schweigert: [name] ?Treating Tuleen Mandelbaum/Extender: Kalman Shan ?Weeks in Treatment: 112 ?Vital Signs ?Height(in): ?Pulse(bpm): 61 ?Weight(lbs): ?Blood Pressure(mmHg): 128/74 ?Body Mass Index(BMI): ?Temperature(??F): 98.1 ?Respiratory Rate(breaths/min): 16 ?Photos: [N/A:N/A] ?Wound Location: Midline Sacrum N/A N/A ?Wounding Event: Pressure Injury N/A N/A ?Primary Etiology: Pressure Ulcer N/A N/A ?Comorbid History: Anemia, Hypertension, History of N/A N/A ?pressure wounds, Rheumatoid ?Arthritis, Paraplegia ?Date Acquired: 06/07/2015 N/A N/A ?Weeks of Treatment: 112 N/A N/A ?Wound Status: Open N/A  N/A ?Wound Recurrence: No N/A N/A ?Measurements L x W x D (cm) 8x3x2.5 N/A N/A ?Area (cm?) : 18.85 N/A N/A ?Volume (cm?) : 47.124 N/A N/A ?% Reduction in Area: 63.40% N/A N/A ?% Reduction in Volume: 84.80% N/A N/A ?Classification

## 2021-09-16 ENCOUNTER — Encounter: Payer: Self-pay | Admitting: Oncology

## 2021-09-16 DIAGNOSIS — M869 Osteomyelitis, unspecified: Secondary | ICD-10-CM | POA: Diagnosis not present

## 2021-09-16 DIAGNOSIS — L89154 Pressure ulcer of sacral region, stage 4: Secondary | ICD-10-CM | POA: Diagnosis not present

## 2021-09-16 DIAGNOSIS — G8220 Paraplegia, unspecified: Secondary | ICD-10-CM | POA: Diagnosis not present

## 2021-09-17 ENCOUNTER — Encounter: Payer: Self-pay | Admitting: Urology

## 2021-09-17 ENCOUNTER — Ambulatory Visit: Payer: Medicare HMO | Admitting: Urology

## 2021-09-17 VITALS — BP 142/84 | HR 67 | Ht 73.5 in

## 2021-09-17 DIAGNOSIS — N319 Neuromuscular dysfunction of bladder, unspecified: Secondary | ICD-10-CM
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
## 2021-09-21 DIAGNOSIS — K435 Parastomal hernia without obstruction or  gangrene: Secondary | ICD-10-CM | POA: Diagnosis not present

## 2021-09-21 DIAGNOSIS — Z939 Artificial opening status, unspecified: Secondary | ICD-10-CM | POA: Diagnosis not present

## 2021-09-22 ENCOUNTER — Other Ambulatory Visit: Payer: Self-pay | Admitting: Family Medicine

## 2021-09-22 DIAGNOSIS — K435 Parastomal hernia without obstruction or  gangrene: Secondary | ICD-10-CM

## 2021-09-30 ENCOUNTER — Inpatient Hospital Stay: Admission: RE | Admit: 2021-09-30 | Payer: Medicare HMO | Source: Ambulatory Visit

## 2021-10-05 ENCOUNTER — Ambulatory Visit
Admission: RE | Admit: 2021-10-05 | Discharge: 2021-10-05 | Disposition: A | Discharge status: Home / Self Care | Payer: Medicare HMO | Source: Ambulatory Visit | Attending: Family Medicine | Admitting: Family Medicine

## 2021-10-05 DIAGNOSIS — G8220 Paraplegia, unspecified: Secondary | ICD-10-CM | POA: Diagnosis not present

## 2021-10-05 DIAGNOSIS — K435 Parastomal hernia without obstruction or  gangrene: Secondary | ICD-10-CM

## 2021-10-05 DIAGNOSIS — M869 Osteomyelitis, unspecified: Secondary | ICD-10-CM | POA: Diagnosis not present

## 2021-10-05 DIAGNOSIS — L89159 Pressure ulcer of sacral region, unspecified stage: Secondary | ICD-10-CM | POA: Diagnosis not present

## 2021-10-05 DIAGNOSIS — Z933 Colostomy status: Secondary | ICD-10-CM | POA: Diagnosis not present

## 2021-10-05 DIAGNOSIS — L89154 Pressure ulcer of sacral region, stage 4: Secondary | ICD-10-CM | POA: Diagnosis not present

## 2021-10-05 DIAGNOSIS — I700 Atherosclerosis of aorta: Secondary | ICD-10-CM | POA: Diagnosis not present

## 2021-10-05 MED ORDER — IOPAMIDOL (ISOVUE-300) INJECTION 61%
100.0000 mL | Freq: Once | INTRAVENOUS | Status: AC | PRN
  Administered 2021-10-05: 100 mL via INTRAVENOUS

## 2021-10-14 ENCOUNTER — Telehealth: Payer: Self-pay

## 2021-10-14 NOTE — Telephone Encounter (Signed)
We received a referral from [facility] Internal Medicine. Per Dr. Delaine Lame she is unable to see this patient at her office due to the patient not being mobile and not being able to properly assess him in her clinic. Dr. Delaine Lame is willing to see the patient at [facility] wound care, she feels [facility] wound clinic is comprehensive and can provide  services like topical wound management , dressing, hyperbaric oxygen, biopsy which are not available in the ID clinic at [facility]. I have advised Internal Medicine of all information and also advised them that they can also refer the patient to [facility] ID location. ?Magdalene Tardiff T Ercel Pepitone ?  ?

## 2021-10-22 ENCOUNTER — Encounter: Payer: Self-pay | Admitting: Internal Medicine

## 2021-10-22 ENCOUNTER — Ambulatory Visit (INDEPENDENT_AMBULATORY_CARE_PROVIDER_SITE_OTHER): Payer: Medicare HMO | Admitting: Internal Medicine

## 2021-10-22 ENCOUNTER — Other Ambulatory Visit: Payer: Self-pay

## 2021-10-22 VITALS — BP 162/82 | HR 71 | Temp 97.8°F

## 2021-10-22 DIAGNOSIS — W3400XS Accidental discharge from unspecified firearms or gun, sequela: Secondary | ICD-10-CM | POA: Diagnosis not present

## 2021-10-22 DIAGNOSIS — G8220 Paraplegia, unspecified: Secondary | ICD-10-CM

## 2021-10-22 DIAGNOSIS — R6889 Other general symptoms and signs: Secondary | ICD-10-CM | POA: Diagnosis not present

## 2021-10-22 DIAGNOSIS — M4628 Osteomyelitis of vertebra, sacral and sacrococcygeal region: Secondary | ICD-10-CM

## 2021-10-22 DIAGNOSIS — S31000S Unspecified open wound of lower back and pelvis without penetration into retroperitoneum, sequela: Secondary | ICD-10-CM | POA: Diagnosis not present

## 2021-10-22 DIAGNOSIS — Z79899 Other long term (current) drug therapy: Secondary | ICD-10-CM | POA: Diagnosis not present
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
## 2021-10-23 LAB — COMPLETE METABOLIC PANEL WITH GFR
AG Ratio: 1.2 (calc) (ref 1.0–2.5)
ALT: 8 U/L — ABNORMAL LOW (ref 9–46)
AST: 12 U/L (ref 10–35)
Albumin: 4.1 g/dL (ref 3.6–5.1)
Alkaline phosphatase (APISO): 91 U/L (ref 35–144)
BUN/Creatinine Ratio: 26 (calc) — ABNORMAL HIGH (ref 6–22)
BUN: 12 mg/dL (ref 7–25)
CO2: 26 mmol/L (ref 20–32)
Calcium: 9.1 mg/dL (ref 8.6–10.3)
Chloride: 105 mmol/L (ref 98–110)
Creat: 0.47 mg/dL — ABNORMAL LOW (ref 0.70–1.28)
Globulin: 3.3 g/dL (calc) (ref 1.9–3.7)
Glucose, Bld: 75 mg/dL (ref 65–99)
Potassium: 3.7 mmol/L (ref 3.5–5.3)
Sodium: 143 mmol/L (ref 135–146)
Total Bilirubin: 0.4 mg/dL (ref 0.2–1.2)
Total Protein: 7.4 g/dL (ref 6.1–8.1)
eGFR: 106 mL/min/{1.73_m2} (ref >=60)

## 2021-10-23 LAB — CBC WITH DIFFERENTIAL/PLATELET
Absolute Monocytes: 488 cells/uL (ref 200–950)
Basophils Absolute: 16 cells/uL (ref 0–200)
Basophils Relative: 0.2 %
Eosinophils Absolute: 256 cells/uL (ref 15–500)
Eosinophils Relative: 3.2 %
HCT: 41.2 % (ref 38.5–50.0)
Hemoglobin: 13.4 g/dL (ref 13.2–17.1)
Lymphs Abs: 2152 cells/uL (ref 850–3900)
MCH: 24.9 pg — ABNORMAL LOW (ref 27.0–33.0)
MCHC: 32.5 g/dL (ref 32.0–36.0)
MCV: 76.6 fL — ABNORMAL LOW (ref 80.0–100.0)
MPV: 9 fL (ref 7.5–12.5)
Monocytes Relative: 6.1 %
Neutro Abs: 5088 cells/uL (ref 1500–7800)
Neutrophils Relative %: 63.6 %
Platelets: 314 10*3/uL (ref 140–400)
RBC: 5.38 10*6/uL (ref 4.20–5.80)
RDW: 15.2 % — ABNORMAL HIGH (ref 11.0–15.0)
Total Lymphocyte: 26.9 %
WBC: 8 10*3/uL (ref 3.8–10.8)

## 2021-10-23 LAB — SEDIMENTATION RATE: Sed Rate: 38 mm/h — ABNORMAL HIGH (ref 0–20)

## 2021-10-23 LAB — C-REACTIVE PROTEIN: CRP: 37.6 mg/L — ABNORMAL HIGH (ref <8.0)

## 2021-10-27 ENCOUNTER — Ambulatory Visit: Payer: Medicare HMO | Admitting: Infectious Diseases

## 2021-10-27 ENCOUNTER — Ambulatory Visit: Payer: Medicare HMO | Admitting: Internal Medicine

## 2021-11-10 ENCOUNTER — Encounter: Payer: Medicare HMO | Attending: Internal Medicine | Admitting: Physician Assistant

## 2021-11-10 ENCOUNTER — Ambulatory Visit: Payer: Medicare HMO | Admitting: Internal Medicine

## 2021-11-10 ENCOUNTER — Ambulatory Visit: Payer: Medicare HMO | Admitting: Infectious Diseases

## 2021-11-10 DIAGNOSIS — L98492 Non-pressure chronic ulcer of skin of other sites with fat layer exposed: Secondary | ICD-10-CM | POA: Diagnosis not present

## 2021-11-10 DIAGNOSIS — M8668 Other chronic osteomyelitis, other site: Secondary | ICD-10-CM | POA: Insufficient documentation

## 2021-11-10 DIAGNOSIS — L89154 Pressure ulcer of sacral region, stage 4: Secondary | ICD-10-CM | POA: Diagnosis not present

## 2021-11-10 DIAGNOSIS — G8220 Paraplegia, unspecified: Secondary | ICD-10-CM | POA: Insufficient documentation

## 2021-11-10 DIAGNOSIS — L97128 Non-pressure chronic ulcer of left thigh with other specified severity: Secondary | ICD-10-CM | POA: Insufficient documentation

## 2021-11-10 NOTE — Progress Notes (Signed)
JOBE, MUTCH ([id number]) Visit Report for 11/10/2021 Chief Complaint Document Details Patient Name: Bradley [name], Bradley [name]. Date of Service: 11/10/2021 10:30 AM Medical Record Number: [id number] Patient Account Number: [id number] Date of Birth/Sex: May 16, 1946 (76 y.o. M) Treating RN: Primary Care Provider: [name] Other Clinician: Referring Provider: [name] Treating Provider/Extender: Skipper Cliche in Treatment: 121 Information Obtained from: Patient Chief Complaint sacral ulcer Electronic Signature(s) Signed: 11/10/2021 10:22:53 AM By: Worthy Keeler PA-C Entered By: Worthy Keeler on 11/10/2021 10:22:52 [name] ([id number]) -------------------------------------------------------------------------------- Problem List Details Patient Name: Bradley [name], Bradley [name]. Date of Service: 11/10/2021 10:30 AM Medical Record Number: [id number] Patient Account Number: [id number] Date of Birth/Sex: March 28, 1946 (76 y.o. M) Treating RN: Primary Care Provider: [name] Other Clinician: Referring Provider: [name] Treating Provider/Extender: Skipper Cliche in Treatment: 121 Active Problems ICD-10 Encounter Code Description Active Date MDM Diagnosis L89.154 Pressure ulcer of sacral region, stage 4 07/16/2019 No Yes M86.68 Other chronic osteomyelitis, other site 07/16/2019 No Yes G82.20 Paraplegia, unspecified 07/16/2019 No Yes L97.128 Non-pressure chronic ulcer of left thigh with other specified severity 04/15/2020 No Yes Inactive Problems ICD-10 Code Description Active Date Inactive Date 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 06/17/2020 06/17/2020 Resolved Problems Electronic Signature(s) Signed: 11/10/2021 10:22:46 AM By: Worthy Keeler PA-C Entered By: Worthy Keeler on 11/10/2021 10:22:46

## 2021-11-10 NOTE — Progress Notes (Signed)
Bradley [name], Bradley [name] ([id number]) Visit Report for 11/10/2021 Arrival Information Details Patient Name: Bradley [name], Bradley [name]. Date of Service: 11/10/2021 10:30 AM Medical Record Number: [id number] Patient Account Number: [id number] Date of Birth/Sex: July 24, 1945 (76 y.o. M) Treating RN: Sharyn Creamer Primary Care Livian Vanderbeck: [name] Other Clinician: Massie Kluver Referring Damein Gaunce: [name] Treating Manie Bealer/Extender: Skipper Cliche in Treatment: 45 Visit Information History Since Last Visit All ordered tests and consults were completed: No Patient Arrived: Wheel Chair Added or deleted any medications: No Arrival Time: 10:26 Any new allergies or adverse reactions: No Transfer Assistance: Harrel Lemon Lift Had a fall or experienced change in No Patient Requires Transmission-Based Precautions: No activities of daily living that may affect Patient Has Alerts: No risk of falls: Hospitalized since last visit: No Pain Present Now: No Electronic Signature(s) Unsigned Entered ByMassie Kluver on 11/10/2021 10:31:45 Signature(s): Date(s):

## 2021-11-16 ENCOUNTER — Other Ambulatory Visit: Payer: Self-pay

## 2021-11-16 DIAGNOSIS — D508 Other iron deficiency anemias: Secondary | ICD-10-CM

## 2021-11-17 ENCOUNTER — Other Ambulatory Visit: Payer: Self-pay

## 2021-11-17 ENCOUNTER — Inpatient Hospital Stay: Payer: Medicare HMO | Attending: Nurse Practitioner

## 2021-11-17 DIAGNOSIS — D509 Iron deficiency anemia, unspecified: Secondary | ICD-10-CM | POA: Diagnosis not present

## 2021-11-17 DIAGNOSIS — D508 Other iron deficiency anemias: Secondary | ICD-10-CM

## 2021-11-17 LAB — CBC WITH DIFFERENTIAL/PLATELET
Abs Immature Granulocytes: 0.01 10*3/uL (ref 0.00–0.07)
Basophils Absolute: 0 10*3/uL (ref 0.0–0.1)
Basophils Relative: 0 %
Eosinophils Absolute: 0.2 10*3/uL (ref 0.0–0.5)
Eosinophils Relative: 3 %
HCT: 40.7 % (ref 39.0–52.0)
Hemoglobin: 12.6 g/dL — ABNORMAL LOW (ref 13.0–17.0)
Immature Granulocytes: 0 %
Lymphocytes Relative: 31 %
Lymphs Abs: 2.2 10*3/uL (ref 0.7–4.0)
MCH: 25 pg — ABNORMAL LOW (ref 26.0–34.0)
MCHC: 31 g/dL (ref 30.0–36.0)
MCV: 80.6 fL (ref 80.0–100.0)
Monocytes Absolute: 0.6 10*3/uL (ref 0.1–1.0)
Monocytes Relative: 8 %
Neutro Abs: 4.1 10*3/uL (ref 1.7–7.7)
Neutrophils Relative %: 58 %
Platelets: 271 10*3/uL (ref 150–400)
RBC: 5.05 MIL/uL (ref 4.22–5.81)
RDW: 15.8 % — ABNORMAL HIGH (ref 11.5–15.5)
WBC: 7.2 10*3/uL (ref 4.0–10.5)
nRBC: 0 % (ref 0.0–0.2)

## 2021-11-17 LAB — IRON AND TIBC
Iron: 38 ug/dL — ABNORMAL LOW (ref 45–182)
Saturation Ratios: 13 % — ABNORMAL LOW (ref 17.9–39.5)
TIBC: 291 ug/dL (ref 250–450)
UIBC: 253 ug/dL

## 2021-11-17 LAB — FERRITIN: Ferritin: 145 ng/mL (ref 24–336)

## 2021-11-24 ENCOUNTER — Other Ambulatory Visit: Payer: Medicare HMO

## 2021-11-25 ENCOUNTER — Inpatient Hospital Stay (HOSPITAL_BASED_OUTPATIENT_CLINIC_OR_DEPARTMENT_OTHER): Payer: Medicare HMO | Admitting: Oncology

## 2021-11-25 ENCOUNTER — Inpatient Hospital Stay: Payer: Medicare HMO

## 2021-11-25 ENCOUNTER — Encounter: Payer: Self-pay | Admitting: Oncology

## 2021-11-25 VITALS — BP 176/79 | HR 68 | Temp 96.1°F | Ht 73.0 in | Wt 209.0 lb

## 2021-11-25 DIAGNOSIS — D508 Other iron deficiency anemias: Secondary | ICD-10-CM

## 2021-11-25 DIAGNOSIS — D509 Iron deficiency anemia, unspecified: Secondary | ICD-10-CM | POA: Diagnosis not present

## 2021-11-25 MED ORDER — FERROUS SULFATE 325 (65 FE) MG PO TBEC: 325.0000 mg | DELAYED_RELEASE_TABLET | Freq: Every day | ORAL | 5 refills | Status: DC

## 2021-11-25 MED ORDER — SODIUM CHLORIDE 0.9 % IV SOLN: 200.0000 mg | Freq: Once | INTRAVENOUS | Status: DC

## 2021-11-25 MED ORDER — DOCUSATE SODIUM 100 MG PO CAPS: 100.0000 mg | ORAL_CAPSULE | Freq: Every day | ORAL | 1 refills | Status: DC

## 2021-11-25 MED ORDER — IRON SUCROSE 20 MG/ML IV SOLN
200.0000 mg | Freq: Once | INTRAVENOUS | Status: AC
  Administered 2021-11-25: 200 mg via INTRAVENOUS
  Filled 2021-11-25: qty 10

## 2021-11-25 NOTE — Patient Instructions (Signed)
[facility]  Discharge Instructions: Thank you for choosing [facility] to provide your oncology and hematology care.  If you have a lab appointment with the Cancer Center, please go directly to the Cancer Center and check in at the registration area.  Wear comfortable clothing and clothing appropriate for easy access to any Portacath or PICC line.   We strive to give you quality time with your provider. You may need to reschedule your appointment if you arrive late (15 or more minutes).  Arriving late affects you and other patients whose appointments are after yours.  Also, if you miss three or more appointments without notifying the office, you may be dismissed from the clinic at the provider's discretion.      For prescription refill requests, have your pharmacy contact our office and allow 72 hours for refills to be completed.    Today you received the following chemotherapy and/or immunotherapy agents VENOFER      To help prevent nausea and vomiting after your treatment, we encourage you to take your nausea medication as directed.  BELOW ARE SYMPTOMS THAT SHOULD BE REPORTED IMMEDIATELY: *FEVER GREATER THAN 100.4 F (38 C) OR HIGHER *CHILLS OR SWEATING *NAUSEA AND VOMITING THAT IS NOT CONTROLLED WITH YOUR NAUSEA MEDICATION *UNUSUAL SHORTNESS OF BREATH *UNUSUAL BRUISING OR BLEEDING *URINARY PROBLEMS (pain or burning when urinating, or frequent urination) *BOWEL PROBLEMS (unusual diarrhea, constipation, pain near the anus) TENDERNESS IN MOUTH AND THROAT WITH OR WITHOUT PRESENCE OF ULCERS (sore throat, sores in mouth, or a toothache) UNUSUAL RASH, SWELLING OR PAIN  UNUSUAL VAGINAL DISCHARGE OR ITCHING   Items with * indicate a potential emergency and should be followed up as soon as possible or go to the Emergency Department if any problems should occur.  Please show the CHEMOTHERAPY ALERT CARD or IMMUNOTHERAPY ALERT CARD at check-in to the  Emergency Department and triage nurse.  Should you have questions after your visit or need to cancel or reschedule your appointment, please contact [facility]  [phone] and follow the prompts.  Office hours are 8:00 a.m. to 4:30 p.m. Monday - Friday. Please note that voicemails left after 4:00 p.m. may not be returned until the following business day.  We are closed weekends and major holidays. You have access to a nurse at all times for urgent questions. Please call the main number to the clinic [phone] and follow the prompts.  For any non-urgent questions, you may also contact your provider using MyChart. We now offer e-Visits for anyone 18 and older to request care online for non-urgent symptoms. For details visit [email or web address].   Also download the MyChart app! Go to the app store, search "MyChart", open the app, select [facility], and log in with your MyChart username and password.  Masks are optional in the cancer centers. If you would like for your care team to wear a mask while they are taking care of you, please let them know. For doctor visits, patients may have with them one support person who is at least 78 years old. At this time, visitors are not allowed in the infusion area.   Iron Sucrose Injection What is this medication? IRON SUCROSE (EYE ern SOO krose) treats low levels of iron (iron deficiency anemia) in people with kidney disease. Iron is a mineral that plays an important role in making red blood cells, which carry oxygen from your lungs to the rest of your body. This medicine may   be used for other purposes; ask your health care provider or pharmacist if you have questions. COMMON BRAND NAME(S): Venofer What should I tell my care team before I take this medication? They need to know if you have any of these conditions: Anemia not caused by low iron levels Heart disease High levels of iron in the blood Kidney disease Liver  disease An unusual or allergic reaction to iron, other medications, foods, dyes, or preservatives Pregnant or trying to get pregnant Breast-feeding How should I use this medication? This medication is for infusion into a vein. It is given in a hospital or clinic setting. Talk to your care team about the use of this medication in children. While this medication may be prescribed for children as young as 2 years for selected conditions, precautions do apply. Overdosage: If you think you have taken too much of this medicine contact a poison control center or emergency room at once. NOTE: This medicine is only for you. Do not share this medicine with others. What if I miss a dose? It is important not to miss your dose. Call your care team if you are unable to keep an appointment. What may interact with this medication? Do not take this medication with any of the following: Deferoxamine Dimercaprol Other iron products This medication may also interact with the following: Chloramphenicol Deferasirox This list may not describe all possible interactions. Give your health care provider a list of all the medicines, herbs, non-prescription drugs, or dietary supplements you use. Also tell them if you smoke, drink alcohol, or use illegal drugs. Some items may interact with your medicine. What should I watch for while using this medication? Visit your care team regularly. Tell your care team if your symptoms do not start to get better or if they get worse. You may need blood work done while you are taking this medication. You may need to follow a special diet. Talk to your care team. Foods that contain iron include: whole grains/cereals, dried fruits, beans, or peas, leafy green vegetables, and organ meats (liver, kidney). What side effects may I notice from receiving this medication? Side effects that you should report to your care team as soon as possible: Allergic reactions--skin rash, itching, hives,  swelling of the face, lips, tongue, or throat Low blood pressure--dizziness, feeling faint or lightheaded, blurry vision Shortness of breath Side effects that usually do not require medical attention (report to your care team if they continue or are bothersome): Flushing Headache Joint pain Muscle pain Nausea Pain, redness, or irritation at injection site This list may not describe all possible side effects. Call your doctor for medical advice about side effects. You may report side effects to FDA at 1-800-FDA-1088. Where should I keep my medication? This medication is given in a hospital or clinic and will not be stored at home. NOTE: This sheet is a summary. It may not cover all possible information. If you have questions about this medicine, talk to your doctor, pharmacist, or health care provider.  2023 Elsevier/Gold Standard (2020-10-16 00:00:00)   

## 2021-11-26 ENCOUNTER — Encounter: Payer: Self-pay | Admitting: Oncology

## 2021-12-09 IMAGING — CR DG PELVIS 1-2V
1 series · 1 of 1 positions shown · non-contrast
Comparison: CT abdomen pelvis dated 06/02/2019.

CLINICAL DATA: 74-year-old male with nonhealing wound over the
sacrum.

EXAM:
PELVIS - 1-2 VIEW

[pelvis ap]
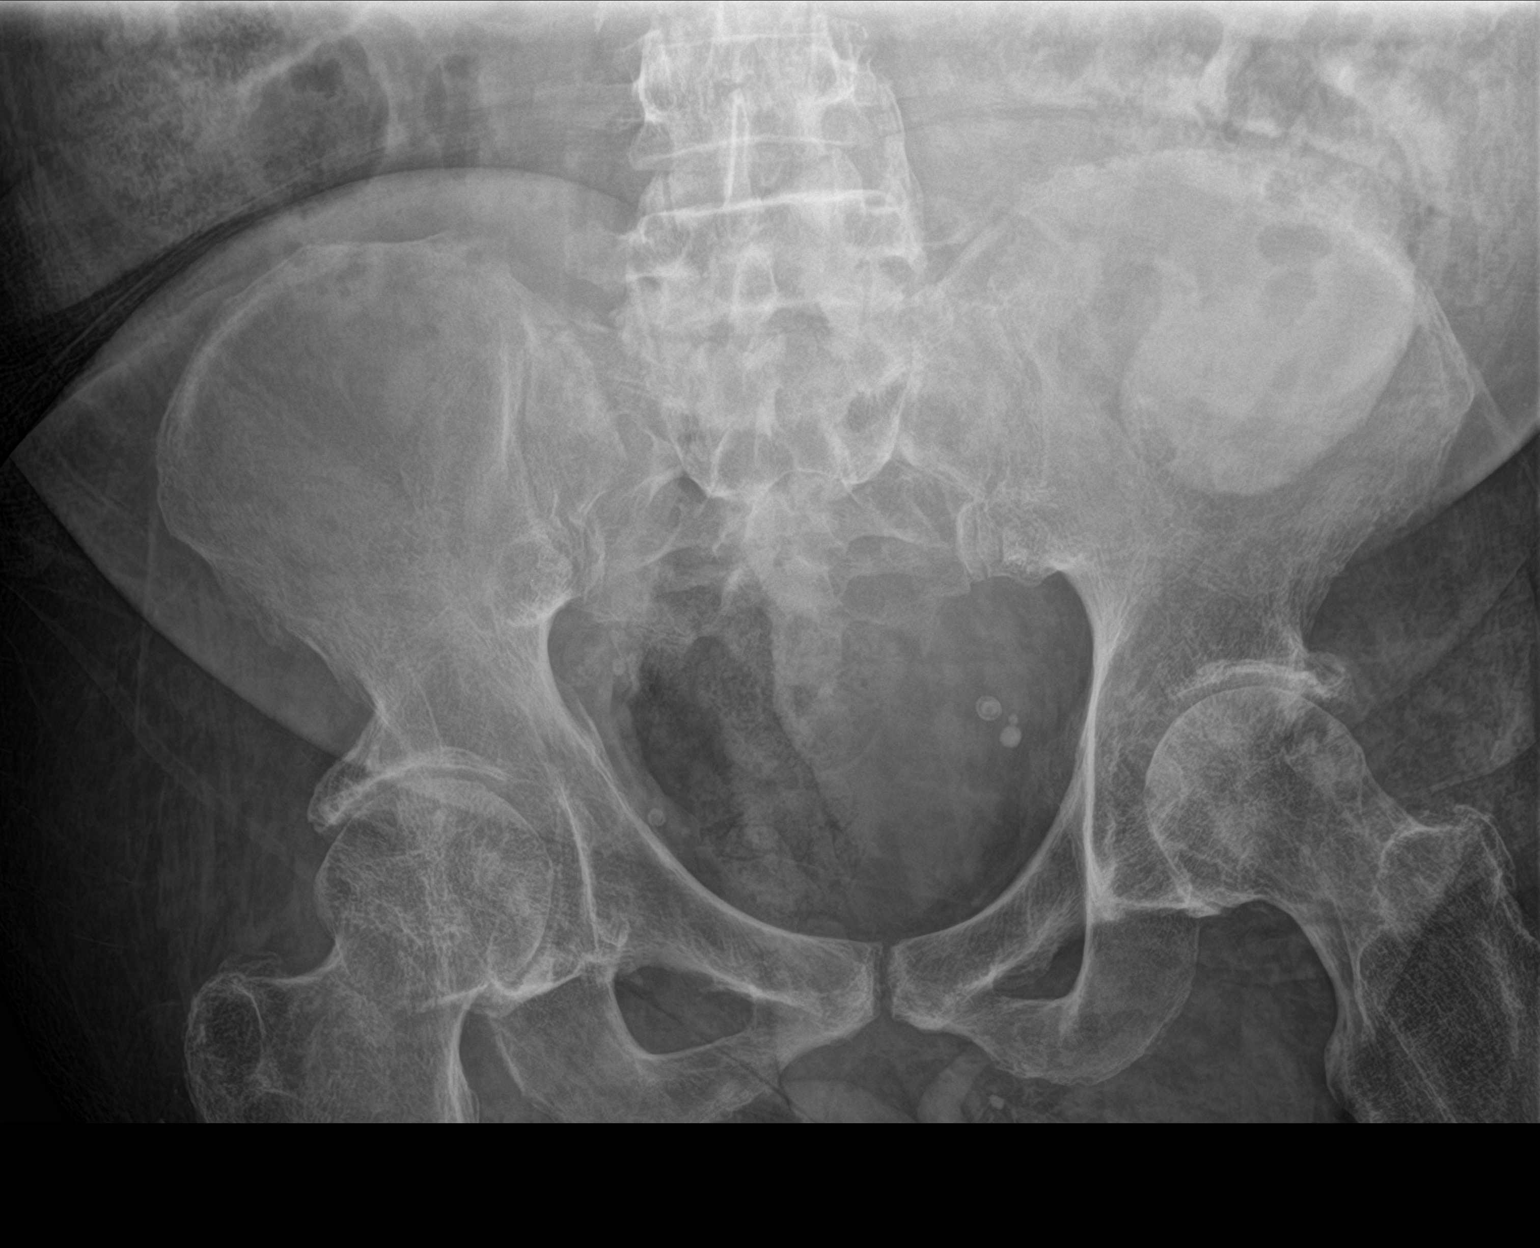

[1 of 1 positions shown; findings below may reference images not displayed]

FINDINGS: Faint curvilinear lucency involving the right femoral neck, likely
artifactual. Clinical correlation is recommended. No other acute
fracture. The bones are osteopenic. There is erosive changes of the
distal sacrum. Bilateral hip arthritic changes. The soft tissues are
unremarkable.
IMPRESSION: 1. Linear lucency involving the right femoral neck, likely
artifactual.
2. Erosive changes of the distal sacrum.

## 2021-12-16 ENCOUNTER — Inpatient Hospital Stay: Payer: Medicare HMO | Attending: Nurse Practitioner

## 2021-12-16 VITALS — BP 140/62 | HR 66 | Temp 97.0°F | Resp 18

## 2021-12-16 DIAGNOSIS — D509 Iron deficiency anemia, unspecified: Secondary | ICD-10-CM | POA: Insufficient documentation

## 2021-12-16 DIAGNOSIS — D508 Other iron deficiency anemias: Secondary | ICD-10-CM

## 2021-12-16 MED ORDER — SODIUM CHLORIDE 0.9 % IV SOLN
200.0000 mg | Freq: Once | INTRAVENOUS | Status: AC
  Administered 2021-12-16: 200 mg via INTRAVENOUS
  Filled 2021-12-16: qty 10

## 2021-12-16 MED ORDER — SODIUM CHLORIDE 0.9 % IV SOLN
Freq: Once | INTRAVENOUS | Status: AC
  Filled 2021-12-16: qty 250

## 2021-12-21 DIAGNOSIS — R6889 Other general symptoms and signs: Secondary | ICD-10-CM | POA: Diagnosis not present

## 2021-12-21 DIAGNOSIS — K435 Parastomal hernia without obstruction or  gangrene: Secondary | ICD-10-CM | POA: Diagnosis not present

## 2021-12-27 DIAGNOSIS — I10 Essential (primary) hypertension: Secondary | ICD-10-CM | POA: Diagnosis not present

## 2021-12-27 DIAGNOSIS — H9193 Unspecified hearing loss, bilateral: Secondary | ICD-10-CM | POA: Diagnosis not present

## 2021-12-27 DIAGNOSIS — M8668 Other chronic osteomyelitis, other site: Secondary | ICD-10-CM | POA: Diagnosis not present

## 2021-12-27 DIAGNOSIS — Z0000 Encounter for general adult medical examination without abnormal findings: Secondary | ICD-10-CM | POA: Diagnosis not present

## 2021-12-27 DIAGNOSIS — Z136 Encounter for screening for cardiovascular disorders: Secondary | ICD-10-CM | POA: Diagnosis not present

## 2021-12-27 DIAGNOSIS — Z1389 Encounter for screening for other disorder: Secondary | ICD-10-CM | POA: Diagnosis not present

## 2021-12-27 DIAGNOSIS — K435 Parastomal hernia without obstruction or  gangrene: Secondary | ICD-10-CM | POA: Diagnosis not present

## 2021-12-27 DIAGNOSIS — K529 Noninfective gastroenteritis and colitis, unspecified: Secondary | ICD-10-CM | POA: Diagnosis not present

## 2021-12-29 DIAGNOSIS — S31000A Unspecified open wound of lower back and pelvis without penetration into retroperitoneum, initial encounter: Secondary | ICD-10-CM | POA: Diagnosis not present

## 2021-12-29 DIAGNOSIS — S81809A Unspecified open wound, unspecified lower leg, initial encounter: Secondary | ICD-10-CM | POA: Diagnosis not present

## 2021-12-29 DIAGNOSIS — N319 Neuromuscular dysfunction of bladder, unspecified: Secondary | ICD-10-CM | POA: Diagnosis not present

## 2021-12-29 DIAGNOSIS — K631 Perforation of intestine (nontraumatic): Secondary | ICD-10-CM | POA: Diagnosis not present

## 2021-12-29 DIAGNOSIS — L89159 Pressure ulcer of sacral region, unspecified stage: Secondary | ICD-10-CM | POA: Diagnosis not present

## 2021-12-29 DIAGNOSIS — Z933 Colostomy status: Secondary | ICD-10-CM | POA: Diagnosis not present

## 2021-12-30 DIAGNOSIS — L89159 Pressure ulcer of sacral region, unspecified stage: Secondary | ICD-10-CM | POA: Diagnosis not present

## 2021-12-30 DIAGNOSIS — K631 Perforation of intestine (nontraumatic): Secondary | ICD-10-CM | POA: Diagnosis not present

## 2021-12-30 DIAGNOSIS — N319 Neuromuscular dysfunction of bladder, unspecified: Secondary | ICD-10-CM | POA: Diagnosis not present

## 2021-12-30 DIAGNOSIS — S31000A Unspecified open wound of lower back and pelvis without penetration into retroperitoneum, initial encounter: Secondary | ICD-10-CM | POA: Diagnosis not present

## 2021-12-30 DIAGNOSIS — S81809A Unspecified open wound, unspecified lower leg, initial encounter: Secondary | ICD-10-CM | POA: Diagnosis not present

## 2021-12-30 DIAGNOSIS — Z933 Colostomy status: Secondary | ICD-10-CM | POA: Diagnosis not present

## 2022-01-12 ENCOUNTER — Encounter: Payer: Medicare HMO | Admitting: Internal Medicine

## 2022-01-13 DIAGNOSIS — K435 Parastomal hernia without obstruction or  gangrene: Secondary | ICD-10-CM | POA: Diagnosis not present

## 2022-01-13 DIAGNOSIS — K439 Ventral hernia without obstruction or gangrene: Secondary | ICD-10-CM | POA: Diagnosis not present

## 2022-01-13 DIAGNOSIS — R6889 Other general symptoms and signs: Secondary | ICD-10-CM | POA: Diagnosis not present

## 2022-01-26 ENCOUNTER — Encounter: Payer: Medicare HMO | Attending: Internal Medicine | Admitting: Internal Medicine

## 2022-01-26 DIAGNOSIS — G8220 Paraplegia, unspecified: Secondary | ICD-10-CM | POA: Diagnosis not present

## 2022-01-26 DIAGNOSIS — L97128 Non-pressure chronic ulcer of left thigh with other specified severity: Secondary | ICD-10-CM | POA: Diagnosis not present

## 2022-01-26 DIAGNOSIS — M8668 Other chronic osteomyelitis, other site: Secondary | ICD-10-CM | POA: Diagnosis not present

## 2022-01-26 DIAGNOSIS — Z933 Colostomy status: Secondary | ICD-10-CM | POA: Diagnosis not present

## 2022-01-26 DIAGNOSIS — D509 Iron deficiency anemia, unspecified: Secondary | ICD-10-CM | POA: Diagnosis not present

## 2022-01-26 DIAGNOSIS — Z87891 Personal history of nicotine dependence: Secondary | ICD-10-CM | POA: Diagnosis not present

## 2022-01-26 DIAGNOSIS — L89154 Pressure ulcer of sacral region, stage 4: Secondary | ICD-10-CM | POA: Insufficient documentation

## 2022-01-27 NOTE — Progress Notes (Signed)
Bradley [name] ([id number]) Visit Report for 01/26/2022 Arrival Information Details Patient Name: Bradley [name]. Date of Service: 01/26/2022 2:15 PM Medical Record Number: [id number] Patient Account Number: [id number] Date of Birth/Sex: 07-10-1945 (76 y.o. M) Treating RN: Bradley [name] Primary Care Bradley [name]: Bradley [name] Other Clinician: Massie [name] Referring Bradley [name]: Bradley [name] Treating Bradley [name]/Extender: Bradley [name] in Treatment: 64 Visit Information History Since Last Visit All ordered tests and consults were completed: No Patient Arrived: Wheel Chair Any new allergies or adverse reactions: No Arrival Time: 14:13 Had a fall or experienced change in No Transfer Assistance: [employer] fast streamer activities of daily living that may affect Patient Requires Transmission-Based Precautions: No risk of falls: Patient Has Alerts: No Signs or symptoms of abuse/neglect since last visito No Pain Present Now: No Electronic Signature(s) Signed: 01/26/2022 3:38:29 PM By: Bradley [name] Entered By: Bradley [name] on 01/26/2022 14:19:23 Bradley [name] ([id number]) -------------------------------------------------------------------------------- Clinic Level of Care Assessment Details Patient Name: Bradley Loa A. Date of Service: 01/26/2022 2:15 PM Medical Record Number: [id number] Patient Account Number: [id number] Date of Birth/Sex: 07/18/1945 (76 y.o. M) Treating RN: Bradley [name] Primary Care Bradley [name]: Bradley [name] Other Clinician: Massie [name] Referring Safa Derner: Bradley [name] Treating Bradley [name]/Extender: Bradley [name] in Treatment: 132 Clinic Level of Care Assessment Items TOOL 4 Quantity Score '[]'$  - Use when only an EandM is performed on FOLLOW-UP visit 0 ASSESSMENTS - Nursing Assessment / Reassessment X - Reassessment of Co-morbidities (includes updates in patient status) 1 10 X- 1 5 Reassessment of Adherence to Treatment Plan ASSESSMENTS -  Wound and Skin Assessment / Reassessment X - Simple Wound Assessment / Reassessment - one wound 1 5 '[]'$  - 0 Complex Wound Assessment / Reassessment - multiple wounds '[]'$  - 0 Dermatologic / Skin Assessment (not related to wound area) ASSESSMENTS - Focused Assessment '[]'$  - Circumferential Edema Measurements - multi extremities 0 '[]'$  - 0 Nutritional Assessment / Counseling / Intervention '[]'$  - 0 Lower Extremity Assessment (monofilament, tuning fork, pulses) '[]'$  - 0 Peripheral Arterial Disease Assessment (using hand held doppler) ASSESSMENTS - Ostomy and/or Continence Assessment and Care '[]'$  - Incontinence Assessment and Management 0 '[]'$  - 0 Ostomy Care Assessment and Management (repouching, etc.) PROCESS - Coordination of Care X - Simple Patient / Family Education for ongoing care 1 15 '[]'$  - 0 Complex (extensive) Patient / Family Education for ongoing care '[]'$  - 0 Staff obtains Programmer, systems, Records, Test Results / Process Orders '[]'$  - 0 Staff telephones HHA, Nursing Homes / Clarify orders / etc '[]'$  - 0 Routine Transfer to another Facility (non-emergent condition) '[]'$  - 0 Routine Hospital Admission (non-emergent condition) '[]'$  - 0 New Admissions / Biomedical engineer / Ordering NPWT, Apligraf, etc. '[]'$  - 0 Emergency Hospital Admission (emergent condition) X- 1 10 Simple Discharge Coordination '[]'$  - 0 Complex (extensive) Discharge Coordination PROCESS - Special Needs '[]'$  - Pediatric / Minor Patient Management 0 '[]'$  - 0 Isolation Patient Management '[]'$  - 0 Hearing / Language / Visual special needs '[]'$  - 0 Assessment of Community assistance (transportation, D/C planning, etc.) '[]'$  - 0 Additional assistance / Altered mentation '[]'$  - 0 Support Surface(s) Assessment (bed, cushion, seat, etc.) INTERVENTIONS - Wound Cleansing / Measurement Bradley [name], Bradley A. ([id number]) X- 1 5 Simple Wound Cleansing - one wound '[]'$  - 0 Complex Wound Cleansing - multiple wounds X- 1 5 Wound Imaging (photographs  - any number of wounds) '[]'$  - 0 Wound Tracing (instead of photographs) X- 1 5 Simple Wound Measurement - one wound '[]'$  - 0 Complex Wound Measurement -  multiple wounds INTERVENTIONS - Wound Dressings '[]'$  - Small Wound Dressing one or multiple wounds 0 '[]'$  - 0 Medium Wound Dressing one or multiple wounds X- 1 20 Large Wound Dressing one or multiple wounds '[]'$  - 0 Application of Medications - topical '[]'$  - 0 Application of Medications - injection INTERVENTIONS - Miscellaneous '[]'$  - External ear exam 0 '[]'$  - 0 Specimen Collection (cultures, biopsies, blood, [name] fluids, etc.) '[]'$  - 0 Specimen(s) / Culture(s) sent or taken to Lab for analysis X- 1 10 Patient Transfer (multiple staff / Bradley [name] / Similar devices) '[]'$  - 0 Simple Staple / Suture removal (25 or less) '[]'$  - 0 Complex Staple / Suture removal (26 or more) '[]'$  - 0 Hypo / Hyperglycemic Management (close monitor of Blood Glucose) '[]'$  - 0 Ankle / Brachial Index (ABI) - do not check if billed separately X- 1 5 Vital Signs Has the patient been seen at the hospital within the last three years: Yes Total Score: 95 Level Of Care: New/Established - Level 3 Electronic Signature(s) Signed: 01/26/2022 3:38:29 PM By: Bradley [name] Entered By: Bradley [name] on 01/26/2022 14:42:32 Bradley [name] ([id number]) -------------------------------------------------------------------------------- Encounter Discharge Information Details Patient Name: Bradley Loa A. Date of Service: 01/26/2022 2:15 PM Medical Record Number: [id number] Patient Account Number: [id number] Date of Birth/Sex: 1946-02-29 (76 y.o. M) Treating RN: Bradley [name] Primary Care Khamiya Varin: Bradley [name] Other Clinician: Massie [name] Referring Bradley [name]: Bradley [name] Treating Bradley [name]/Extender: Bradley [name] in Treatment: 132 Encounter Discharge Information Items Discharge Condition: Stable Ambulatory Status: Wheelchair Discharge Destination:  Home Transportation: Other Accompanied By: self Schedule Follow-up Appointment: Yes Clinical Summary of Care: Electronic Signature(s) Signed: 01/27/2022 4:37:38 PM By: Bradley [name] Entered By: Bradley [name] on 01/26/2022 17:17:50 Bradley [name] ([id number]) -------------------------------------------------------------------------------- Lower Extremity Assessment Details Patient Name: Bradley Loa A. Date of Service: 01/26/2022 2:15 PM Medical Record Number: [id number] Patient Account Number: [id number] Date of Birth/Sex: 1945-09-22 (76 y.o. M) Treating RN: Bradley [name] Primary Care Kollins Fenter: Bradley [name] Other Clinician: Massie [name] Referring Shanie Mauzy: Bradley [name] Treating [name] Demont/Extender: Bradley [name] in Treatment: 132 Electronic Signature(s) Signed: 01/26/2022 3:38:29 PM By: Bradley [name] Signed: 01/27/2022 4:19:23 PM By: Bradley Coria RN Entered By: Bradley [name] on 01/26/2022 14:34:53 Bradley [name] ([id number]) -------------------------------------------------------------------------------- Multi Wound Chart Details Patient Name: Bradley [name], Bradley A. Date of Service: 01/26/2022 2:15 PM Medical Record Number: [id number] Patient Account Number: [id number] Date of Birth/Sex: 1946/03/03 (76 y.o. M) Treating RN: Bradley [name] Primary Care Kayani Rapaport: Bradley [name] Other Clinician: Massie [name] Referring Sadhana Frater: Bradley [name] Treating Elcie Pelster/Extender: Bradley [name] in Treatment: 132 Vital Signs Height(in): Pulse(bpm): 24 Weight(lbs): Blood Pressure(mmHg): 149/77 [name] Mass Index(BMI): Temperature(F): 98.1 Respiratory Rate(breaths/min): 18 Photos: [N/A:N/A] Wound Location: Midline Sacrum N/A N/A Wounding Event: Pressure Injury N/A N/A Primary Etiology: Pressure Ulcer N/A N/A Comorbid History: Anemia, Hypertension, History of N/A N/A pressure wounds, Rheumatoid Arthritis, Paraplegia Date Acquired: 06/07/2015 N/A N/A Weeks  of Treatment: 132 N/A N/A Wound Status: Open N/A N/A Wound Recurrence: No N/A N/A Measurements L x W x D (cm) 9x3x2.1 N/A N/A Area (cm) : 21.206 N/A N/A Volume (cm) : 44.532 N/A N/A % Reduction in Area: 58.80% N/A N/A % Reduction in Volume: 85.60% N/A N/A Classification: Category/Stage IV N/A N/A Exudate Amount: Large N/A N/A Exudate Type: Sanguinous N/A N/A Exudate Color: red N/A N/A Wound Margin: Epibole N/A N/A Granulation Amount: Large (67-100%) N/A N/A Granulation Quality: Red, Pink N/A N/A Necrotic Amount: Small (1-33%) N/A N/A Exposed Structures: Fat Layer (Subcutaneous Tissue): N/A N/A Yes Muscle:  Yes Fascia: No Tendon: No Joint: No Bone: No Epithelialization: None N/A N/A Treatment Notes Electronic Signature(s) Signed: 01/26/2022 3:38:29 PM By: Bradley [name] Entered By: Bradley [name] on 01/26/2022 14:58:43 Bradley [name] ([id number]) -------------------------------------------------------------------------------- [place] Details Patient Name: Bradley [name], Bradley A. Date of Service: 01/26/2022 2:15 PM Medical Record Number: [id number] Patient Account Number: [id number] Date of Birth/Sex: 04/01/1946 (76 y.o. M) Treating RN: Bradley [name] Primary Care Neeraj Housand: Bradley [name] Other Clinician: Massie [name] Referring Liylah Najarro: Bradley [name] Treating Carianne Taira/Extender: Bradley [name] in Treatment: 132 Active Inactive Electronic Signature(s) Signed: 01/26/2022 3:38:29 PM By: Bradley [name] Signed: 01/27/2022 4:19:23 PM By: Bradley Coria RN Entered By: Bradley [name] on 01/26/2022 14:58:36 Bradley [name] ([id number]) -------------------------------------------------------------------------------- Pain Assessment Details Patient Name: Bradley [name], Bradley A. Date of Service: 01/26/2022 2:15 PM Medical Record Number: [id number] Patient Account Number: [id number] Date of Birth/Sex: 1945/07/16 (76 y.o. M) Treating RN: Bradley [name] Primary Care  Danylah Holden: Bradley [name] Other Clinician: Massie [name] Referring Shellie Rogoff: Bradley [name] Treating Zyree Traynham/Extender: Bradley [name] in Treatment: 132 Active Problems Location of Pain Severity and Description of Pain Patient Has Paino No Site Locations Pain Management and Medication Current Pain Management: Electronic Signature(s) Signed: 01/26/2022 3:38:29 PM By: Bradley [name] Signed: 01/27/2022 4:19:23 PM By: Bradley Coria RN Entered By: Bradley [name] on 01/26/2022 14:21:27 Bradley [name] ([id number]) -------------------------------------------------------------------------------- Patient/Caregiver Education Details Patient Name: Bradley Loa A. Date of Service: 01/26/2022 2:15 PM Medical Record Number: [id number] Patient Account Number: [id number] Date of Birth/Gender: Oct 30, 1945 (76 y.o. M) Treating RN: Bradley [name] Primary Care Physician: Bradley [name] Other Clinician: Massie [name] Referring Physician: Dion [name] Treating Physician/Extender: Bradley [name] in Treatment: 67 Education Assessment Education Provided To: Patient Education Topics Provided Wound/Skin Impairment: Handouts: Other: continue wound care as directed Methods: Explain/Verbal Responses: State content correctly Electronic Signature(s) Signed: 01/26/2022 3:38:29 PM By: Bradley [name] Entered By: Bradley [name] on 01/26/2022 14:43:02 Bradley [name] ([id number]) -------------------------------------------------------------------------------- Wound Assessment Details Patient Name: Bradley Loa A. Date of Service: 01/26/2022 2:15 PM Medical Record Number: [id number] Patient Account Number: [id number] Date of Birth/Sex: 1945-12-25 (76 y.o. M) Treating RN: Bradley [name] Primary Care Bettylou Frew: Bradley [name] Other Clinician: Massie [name] Referring Samaira Holzworth: Bradley [name] Treating Khrystal Jeanmarie/Extender: Bradley [name] in Treatment: 132 Wound  Status Wound Number: 10 Primary Pressure Ulcer Etiology: Wound Location: Midline Sacrum Wound Open Wounding Event: Pressure Injury Status: Date Acquired: 06/07/2015 Comorbid Anemia, Hypertension, History of pressure wounds, Weeks Of Treatment: 132 History: Rheumatoid Arthritis, Paraplegia Clustered Wound: No Photos Wound Measurements Length: (cm) 9 % Redu Width: (cm) 3 % Redu Depth: (cm) 2.1 Epithe Area: (cm) 21.206 Volume: (cm) 44.532 ction in Area: 58.8% ction in Volume: 85.6% lialization: None Wound Description Classification: Category/Stage IV Foul O Wound Margin: Epibole Slough Exudate Amount: Large Exudate Type: Sanguinous Exudate Color: red dor After Cleansing: No /Fibrino Yes Wound Bed Granulation Amount: Large (67-100%) Exposed Structure Granulation Quality: Red, Pink Fascia Exposed: No Necrotic Amount: Small (1-33%) Fat Layer (Subcutaneous Tissue) Exposed: Yes Necrotic Quality: Adherent Slough Tendon Exposed: No Muscle Exposed: Yes Necrosis of Muscle: No Joint Exposed: No Bone Exposed: No Treatment Notes Wound #10 (Sacrum) Wound Laterality: Midline Cleanser Dakin 16 (oz) 0.25 Discharge Instruction: Use as directed. Bradley [name], Bradley [name] ([id number]) Peri-Wound Care Topical Primary Dressing Gauze Discharge Instruction: As directed: moistened with Dakins Solution Secondary Dressing ABD Pad 5x9 (in/in) Discharge Instruction: Cover with ABD pad Secured With Medipore Tape - 45M Medipore H Soft Cloth Surgical Tape, 2x2 (in/yd) Compression Wrap Compression Stockings Add-Ons Electronic Signature(s) Signed: 01/26/2022 3:38:29  PM By: Bradley [name] Signed: 01/27/2022 4:19:23 PM By: Bradley Coria RN Entered By: Bradley [name] on 01/26/2022 14:38:40 Bradley [name] ([id number]) -------------------------------------------------------------------------------- [place] Details Patient Name: Bradley Loa A. Date of Service: 01/26/2022 2:15 PM Medical Record Number:  [id number] Patient Account Number: [id number] Date of Birth/Sex: 12-Sep-1945 (76 y.o. M) Treating RN: Bradley [name] Primary Care Travon Crochet: Bradley [name] Other Clinician: Massie [name] Referring Liandro Thelin: Bradley [name] Treating Melvine Julin/Extender: Bradley [name] in Treatment: 132 Vital Signs Time Taken: 14:19 Temperature (F): 98.1 Pulse (bpm): 66 Respiratory Rate (breaths/min): 18 Blood Pressure (mmHg): 149/77 Reference Range: 80 - 120 mg / dl Electronic Signature(s) Signed: 01/26/2022 3:38:29 PM By: Bradley [name] Entered By: Bradley [name] on 01/26/2022 14:21:23

## 2022-01-27 NOTE — Progress Notes (Signed)
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 well. He was offered a diverting colostomy but refused. He is not a candidate for a wound VAC as the actual wound is precariously close to his anal opening. As mentioned he does have [facility] but miraculously this patient who is a paraplegic is actually changing the dressings himself. 04/12/16 patient  presents today for a follow-up of his essentially large sacral pressure ulcer stage III. Nothing has changed dramatically since I last saw him about one month ago. He has seen Dr. Dellia Nims once the interim. With that being said patient's wound appears somewhat less macerated today compared to previous evaluations. He still has no pain being a quadriplegic. 04-26-16 Mr. Agena returns today for a violation of his stage III sacral pressure ulcer he denies any complaints concerns or issues over the past 2 weeks. He missed to changing dressing twice daily due to drainage although he states this is not an increase in drainage over the past 2 weeks. He does change his dressings independently. He admits to sitting in his motorized chair for  no more than 2-3 hours at which time he transfers to bed and rotates lateral position. 05/10/16; Viraj Liby returns today for review of his stage III sacral pressure ulcer. He denies any concerns over the last 2 weeks although he seems to be running out of Aquacel Ag and on those days he uses Medihoney. He has [facility] was supplying his dressings. He still complains of drainage. He does his dressings independently. He has in his motorized chair for 2-3 hours that time other than that he offloads this. Dimensions of the wound are down 1 cm in both directions. He underwent an aggressive debridement on his last visit of thick circumferential skin and subcutaneous tissue. It is possible at some point in the future he is going to need this done again 05/24/16; the patient returns today for review of his stage III sacral pressure ulcer. We have been using Aquacel Ag he tells me that he changes this up to twice a day. I'm not really certain of the reason for this frequency of changing. He has some involvement from the home health nurses but I think is doing most of the changing himself which I think because of his paraplegia would be a very difficult exercise.  Nevertheless he states that there is "wetness". I am not sure if there is another dressing that we could easily changed that much. I'd wanted to change to [facility] but I'll need to have a sense of how frequent he would need to change this. Bradley [name], Bradley [name] ([id number]) 06/14/16; this is a patient returns for review of his stage III sacral pressure ulcer. We have been using Aquacel Ag and over the last 2 visits he has had extensive debridement so of the thick circumferential skin and subcutaneous tissue that surrounds this wound. In spite of this really absolutely no change in the condition of the wound warrants measurements. We have [facility] home health I believe changing the dressing on 3 occasions the patient states he does this on one occasion himself 06/28/16; this is a patient who has a fairly large stage III sacral pressure ulcer. I changed him to [facility] from Aquacel 2 weeks ago. He returns today in follow-up. In the meantime a nurse from [facility] has calledrequesting ordering of a wound VAC. He had this discussion before. The problem is the proximity of the lowest edge of this wound to the patient's anal opening roughly 3/4 of an inch. Can't see how this can be arranged. Apparently the nurse who is calling has a lot of experience, the question would be then when she is not available would be doing this. I would not have thought that this wound is not amenable to a wound VAC because of this reason 07/12/16; the patient comes in today and I have signed orders for a wound VAC. The home health team through [facility] is convinced that he can benefit from this even though there is close proximity to his anal opening beneath the gluteal clefts. The patient does not have a bowel regimen but states he has a bowel movement every 2 days this will also provide some problem with regards to the vac seal 07/26/16; the patient never did obtain a [place] wound VAC as he could not afford the  $200 per month co-pay we have been using Hydrofera Blue now for 6 weeks or so. No major change in this wound at all. He is still not interested in the concept of plastic surgery. There changing the dressing  every second day 08/09/16; the patient arrives with a wound precisely in the same situation. In keeping with the plan I outlined last time extensive debridement with an open curet the surface of this is not completely viable. Still has some degree of surrounding thick skin and subcutaneous tissue. No evidence of infection. Once again I have had a conversation with him about plastic surgery, he is simply not interested. 08/23/16; wound is really no different. Thick circumferential skin and subcutaneous tissue around the wound edge which is a lot better from debridement we did earlier in the year. The surface of the wound looks viable however with a curet there is definitely a gritty surface to this. We use Medihoney for a while, he could not afford Santyl. I don't think we could get a supply of Iodoflex. He talks a little more positively about the concept of plastic surgery which I've gone over with him today 08/31/16;; patient arrives in clinic today with the wound surface really no different there is no changes in dimensions. I debrided today surface on the left upper side of this wound aggressively week ago there is no real change here no evidence of epithelialization. The problem with debridement in the clinic is that he believes from this very liberally. We have been using Sorbact. 09/21/16; absolutely no change in the appearance or measurements of this wound. More recently I've been debrided in this aggressively and using sorbact to see if we could get to a better wound surface. Although this visually looks satisfactory, debridement reveals a very gritty surface to this. However even with this debridement and removal of thick nonviable skin and subcutaneous tissue from around the large amount of  the circumference of this wound we have made absolutely no progress. This may be an offloading issue I'm just not completely certain. It has 2 close proximity in its inferior aspect to consider negative pressure therapy 10/26/16; READMISSION This patient called our clinic yesterday to report an odor in his wound. He had been to see plastic surgery at [facility] at our request after his last visit on 09/21/16; we have been seeing him for several months with a large stage III wound. He had been sent to general surgery for consideration of a colostomy, that appointment was not until mid June He comes in today with a temperature of 101. He is reporting an odor in the wound since last weekend. 01/10/17 Readmission: 01/10/17 On evaluation today it is noted that patient has been seen by plastic surgery at [facility] since he was last evaluated here. They did discuss with him the possibility of a flap according to the notes but unfortunately at this point he was not quite ready to proceed with surgery and instead wanted to give the Wound VAC a try. In the hospital they were able to get a good seal on the Wound VAC. Unfortunately since that time they have been having trouble in regard to his current home health company keeping a simple on the Wound VAC. He would like to switch to a different home health company. With that being said it sounds as if the problem is that his wound VAC is not feeling at the lower portion of his back and he tells me that he can take some of the clear plastic and put over that area when the sill breaks and it will correct it for time. He has no discomfort or pain which is good news. He has been treated with IV vancomycin since he was last  seen here and has an appointment with a infectious disease specialist in two days on 01/12/17. Otherwise he was transferred back to [place] for continuing to monitor and manage is wound as she progresses with a Wound VAC for the time being. 01/17/17 on evaluation today  patient continues to show evidence of slight improvement with the Wound VAC fortunately there's no evidence of infection or otherwise worsening condition in general. Nonetheless we were unable to get him switch to [facility] in regard to home help from his current company. I'm not sure the reasoning behind but for some reason he was not accepted as a patient with him. Continue to apply the Wound VAC which does still show that some maceration around the wound edges but the wound measurements were slightly improved. No fevers, chills, nausea, or vomiting noted at this time. 02/14/17; this patient I have not seen in 5 months although he has been readmitted to our clinic seen by our physician assistant Jeri Cos twice in early August. I have looked through [facility] notes care everywhere. The patient saw plastic surgery in May [Dr. Bhatt}. The patient was sent to general surgery and ultimately had a colostomy placed. On 11/29/16. This was after he was admitted to [facility] sometime in May. An MRI of the pelvis on 5/23 showed osteomyelitis of the coccyx. An attempt was made to drain fluid that was not successful. He was treated with empiric broad-spectrum antibiotics VAC/cefepime/Flagyl starting on 11/02/16 with plans for a 6 week course. According to their notes he was sent to a nursing home. Was last seen by Dr. Myriam Jacobson of plastic surgery on 12/28/16. The first part of the note is a long dissertation about the difficulties finding adequate patients for flap closure of pressure ulcers. At that time the wound was noted to be stage IV based I think on underlying infection no exposed bone and healthy granulation tissue. 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 He was last seen by infectious disease 01/12/17 A Dr. Uvaldo Rising. His note says that Mr. Knebel was not interested in a flap closure for referring a trial of the wound VAC. As previously anticipated the wound VAC could not  be maintained as an outpatient in the community. He is now using something similar to a Dakin's wet to dry recommended by [facility] VASHE solution. He is placing this twice a day himself. This is almost s hopeless setting in terms of heeling 02/28/17; he is using a Dakin's wet to dry. Most of the wound surface looks satisfactory however the deeper area over his coccyx now has exposed bone I'm not sure if I noted this last week. 03/21/17; patient is usingVASHE solution wet to dry which I gather is a variation on Dakin solution. He has home health changing this 3 times a week the other days he does this himself. His appointment with plastic surgery 04/18/17; patient continues to use a variant of Dakin solution I believe. His wound continues to have a clean viable surface. The 2 areas of exposed bone in the center of this wound had closed over. He has an appointment with plastic surgery on December 5 at which time I hope that there'll be a plan for myocutaneous flap closure In looking through [facility] link I couldn't find any more plastic surgery appointments. I did come across the fact that he is been followed by Bradley [name] ([id number]) hematology for a microcytic hypochromic anemia. He had a reasonably normal looking  hemoglobin electrophoresis. His iron level was 10 and according to the patient he is going for IV iron infusions starting tomorrow. He had a sedimentation rate of 74. More problematically from a pure wound care point of view his albumin was 2.7 earlier this month 05/17/17; this is a patient I follow monthly. He has a large now stage IV wound over his bilateral buttocks with close proximity to his anal opening. More recently he has developed a large area with exposed bone in the center of this probably secondary to the underlying osteomyelitis E had in the summer. He also follows with Dr. Myriam Jacobson at [facility] who is plastic surgery. He had an appointment earlier this month and according to the  patient Dr. Myriam Jacobson does not want to proceed with any attempted flap closure. Although I do not have current access to her note in care everywhere this is likely due to exposed bone. Again according to the patient they did a bone biopsy. He is still using a variant of Dakin solution changing twice a day. He has home health. The patient is not able to give me a firm answer about how long he spends on this in his wheelchair The patient also states that Dr. Myriam Jacobson wanted to reconsider a wound VAC. I really don't see this as a viable option at least not in the outpatient setting. The wound itself is frankly to close to his anal opening to maintain a seal. The last time we tried to do this home health was unable to manage it. It might be possible to maintain a wound VAC in this setting outside of the home such as a skilled nursing facility or an [place] however I am doubtful about this even in that setting **** READMISSION 09/21/17-He is here for evaluation of stage IV sacral ulcer. Since his last evaluation here in December he has completed treatment for sacral osteomyelitis. He was at [facility] for IV therapy and NPWT dressing changes. He was discharged, with home health services, in February. He admits that while in the skilled facility he had "80%" success with maintaining dressing, since discharge he has had approximately "40%" success with maintaining wound VAC dressing. We discussed at length that this is not a safe or recommended option. We will apply Dakin's wet to dry dressing daily and he will follow-up next week. He is accompanied today by his sister who is willing to assist in dressing changes; they will discuss the social issue as he feels he is capable of changing dressing daily when home health is not able. 09/28/17-He is here in follow-up evaluation for stage IV sacral pressure ulcer. He has been using the Dakin's wet-to-dry daily; he continues with home health. He is not accompanied  by anyone at this visit. He will follow up in two weeks per his request/preference. 10/12/17 on evaluation today patient appears to be doing very well. The Dakin solution went to dry packings do seem to be helping him as far as the sacral wound is concerned I'm not seeing anything that has me more concerned as far as infection or otherwise is concerned. Overall I'm pleased with the appearance of the wound. 10/26/17-He is here in follow up evaluation for a stage IV sacral ulcer. He continues with daily Dakin's wet-to-dry. He is voicing no complaints or concerns. He will follow-up in 2 weeks 11/16/17-He is here for follow up evaluation for a palliative stage IV sacral pressure ulcer. We will continue with Dakin's wet-to-dry. He will follow- up in 4  weeks. He is expressing concern/complaint regarding new bed that has arrived, stating he is unable to manipulate/maneuver it due to the bed crank being at the foot of the bed. 12/14/17-He is here for evaluation for palliative stage IV sacral ulcer. He is voicing no complaints or concerns. We will continue with one-to-one ratio of saline and dakins. He will follow-up in 3 weeks 01/04/18-He is here in follow-up evaluation for palliative stage IV sacral ulcer. He is inquiring about reinstating the negative pressure wound therapy. We discussed at length that the negative pressure wound therapy in the home setting has not been successful for him repeatedly with loss of cereal and unavailability of 24/7 help; reminded him that home health is not available 24/7 when loss of seal occurs. He does verbalize understanding to this and does not pursue. We also discussed the palliative nature of this ulcer (given no significant change/improvement in measurement/appearance, not a candidate for muscle flap per plastic surgery, and continued independent living) and that the goal is for maintenance, decrease in infection and minimizing/avoiding deterioration given that he is  independent in his care, does not have home health and requires daily dressing changes secondary to drainage amount. He is inquiring about a wound clinic in [facility], I have informed him that I am unfamiliar with that clinic but that he is encouraged to seek another opinion if that is his desire. We will continue with dakins and he will follow up in three weeks 01/25/18-He is here in follow-up evaluation for palliative stage IV sacral ulcer. He continues with Dakin's/saline 1:1 mixture wet to dry dressing changes. He states he has an appointment at [facility] on 9/17 for evaluation of surgical intervention/closure of the sacral ulcer. He will follow-up here in 4 weeks Readmission: 07/16/2019 upon evaluation today patient appears to be doing really about the same as when he was previously seen here in the wound care center. He most recently was a patient of [facility] back when she was still working here in the center but had been referred to [facility] for consideration of a flap. With that being said the surgeon there at [facility] stated that this was too large for her flap and they have been attempting to get this smaller in order to be able to proceed with a flap. Nonetheless unfortunately he has had a cycle of going back and forth between the osteomyelitis flaring and then sent him back and then making a little bit of progress only to be sent back again. It sounds like most recently they have been using a Iodoflex type dressing at this point which does not seem to have done any harm by any means. With that being said this wound seems to be quite large for using Iodoflex throughout and subsequently I think he may do much better with the use of Vioxx moistened gauze which would be safe for the new tissue growing and also keep the wound quite nice and clean. The patient is not opposed to this he in fact states that his home health nurse had mentioned this as a possibility as well. 07/23/2019 upon evaluation today patient actually  appears to be doing very well in regard to his sacral wound. In fact this is much healthier the measurements not terribly different but again with a wound like this at home necessarily expect a a lot of change as far as the overall measurements are concerned in just 1 week's time. This is going be a much longer term process at this point. With  that being said I do think that he is very healthy appearing as far as the base of the wound is concerned. 08/06/2019 upon evaluation today patient actually appears to be doing about the same with regard to his wound to be honest. There is really not a significant improvement overall based on what I am seeing today. Fortunately there is no signs of significant systemic infection. No fevers, chills, nausea, vomiting, or diarrhea. With that being said there is odor to the drainage from the wound and subsequently also what appears to be increased drainage based on what we are seeing today as well as what his home health nurse called [place] about that she was concerned with as well. 08/13/2019 upon evaluation today patient appears to be doing about the same at this point with regard to his wound although I think the dressing may be a little less drainage wise compared to what it was previous. Fortunately there is no signs of active infection at this time. No fevers, chills, nausea, vomiting, or diarrhea. He has been taking the antibiotic for only 3 days. 08/23/2019 upon evaluation today patient appears to be doing not nearly as well with regard to his wound. In general really not making a lot of progress which is unfortunate. There does not appear to be any signs of active infection at this time. No fevers, chills, nausea, vomiting, or diarrhea. With that being said the patient unfortunately does seem to be experiencing continued epiboly around the edges of the wound as well as significant scar tissue he has been in the majority of his day sitting in his chair which is likely  a big reason for all of this. Fortunately there is no Bradley [name], Bradley A. ([id number]) signs of active infection at this time. No fevers, chills, nausea, vomiting, or diarrhea. 08/29/2019 upon evaluation today patient's wound actually appears to be showing some signs of improvement. The region of few areas of new skin growth around the edges of the wound even where he has some of the epiboly. This is actually good news and I think that he has been very aggressive and offloading over the past week since we showed him the pictures of his wound last week. There still may be a chance that he is going require some sharp debridement to clear away some of these edges but to be honest I think that is can be quite an undertaking. The main reason is that he has a lot of thickened scar tissue and it is good to bleed quite significantly in my opinion. The I think if organ to do that we may even need to have a portable or disposable cautery in order to make sure that we are able to get the area completely sealed up as far as bleeding is concerned. I do have one that we can utilize. However being that the wound looks so much better right now would like to give this 2 weeks to see how things stand and look at that point before making any additional recommendations. 09/12/2019 upon evaluation today patient appears to be making some progress as far as offloading is concerned. He still has a substantial wound but nonetheless he tells me he is off loading much more aggressively at this point. This is obviously good news. No fevers, chills, nausea, vomiting, or diarrhea. 4/22; I have not seen this patient in quite some time. No major change in the condition of the wound or its wound volume. Surrounding maceration of the skin around the  edges of the wound. The wound is fairly substantial. To close to the anal opening to consider a wound VAC. He had extensive trials of plastic surgery at [facility] which really does not  result in any improvement. His wound bed is however clean 10/10/2019 upon evaluation today patient appears to be doing well with regard to the wound in the sacral region. He has been tolerating the dressing changes without complication. Fortunately there is no signs of infection and he actually has some epithelial growth noted as well. He tells me has been trying to continue to offload this area which is excellent that is what he needs to do most as far as what he can have in his control. Otherwise I feel like the collagen with a saline moistened gauze behind has been beneficial for him. 5/20; collagen and normal saline wet-to-dry. Although the tissue when this large sacral wound looks reasonable there is been absolutely no benefit to the overall closure. He has macerated skin around this. He tells me that he spends a large part of the day up in the wheelchair. He still has home health 11/07/2019 upon evaluation today patient appears to be doing well with regard to his wounds at this point. Fortunately there is no signs of active infection at this time. Overall I feel like his sacral wound in general appears to be doing quite nicely. I am very pleased with overall how things are progressing and I feel like the patient is making excellent progress towards resolution. With that being said he still has a long ways to go before we expect to see this completely healed. 11/21/2019 upon evaluation today patient appears to be doing about the same in regard to his sacral wound. Unfortunately he is really not making much in the way of improvement when I questioned him about how much time he is really spending sitting on his bottom he tells me that the majority of his day is mainly in that position though he does spend some time in bed. It does not sound like it is enough however neither does it look like it is enough. 12/24/2019 upon evaluation today patient appears to be doing really about the same there is no  significant improvement overall in his wound bed. Last time he was seen here we did have a discussion about the possibility of seeing if I can get a surgeon to evaluate and potentially clear away some of the thickened edges of the wound in order to allow this wound to potentially have a chance of closing with a wound VAC. Right now we could not even get a wound VAC to seal with some of the rolled and thickened edges. With that being said elected to the patient to think about this to discuss today. At this point the patient does want to see if I could make the referral to potentially see if the surgeon would be able to perform this and subsequently he agrees to go to a skilled nursing facility following if they are able to do this in order to allow for a wound VAC and to get this wound to heal more effectively and quickly. In my opinion the only way that I would recommend that he see the surgeon to clear away the edges would be if he is also in agreement with going to skilled nursing facility as I know he cannot appropriately offload at home to take care of this and that setting. 01/07/2020 on evaluation today patient appears to  be doing about the same in regard to his wound. There is no signs of active infection at this time which is great news. All in all he is really doing about the same. We have made referral to surgery to see if they can potentially perform debridement to clear away some of the scarred edging of the wound so we could potentially see about a wound VAC to try to help this heal more efficiently. Again we have discussed that if this occurs he would be in a skilled nursing facility following or release would need to be in order to appropriately offload. Nonetheless we are still in the process of working that referral. 01/21/2020 upon evaluation today patient appears to be doing about the same in regard to his sacral wound. He did see Dr. Lysle Pearl Dr. Ines Bloomer feeling was that there was not  anything surgically he could offer at this point as he did not feel like that a wound VAC would likely be appropriate for the patient. With that being said he did recommend potential for trying to get the patient into a skilled nursing facility as an outpatient and they will get me working on that as well. Fortunately there is no signs of active infection at this time. 11/10; this is a patient that has not been seen here in quite a bit of time while we were in our alternative venue. As well I have not seen this patient in probably over 2 years. As I remember things with him he has had this pressure ulcer encompassing his lower sacrum and coccyx and the surrounding buttocks for a prolonged period of time. When he first came here the surface looked fairly good and the wound was smaller we attempted a wound VAC but we could never keep this in place. Because of nonresponse of the wound to topical dressings we referred him to [facility] plastic surgery where preparation was being made for flap closure. Intact he had a colostomy however after that he was deemed to have 2 large wound for flap closure. He did have osteomyelitis in the coccyx area in 2019. He is using wet-to-dry he has home health. They are managing his ostomy supplies as well. The wound is substantially larger than what I remember. Versus last time he was here to wound months or so ago he has an unstageable wound on the left upper thigh posteriorly. He tells me he is in his chair quite a bit especially recently 12/1; patient comes back to clinic. He did not get the lab work done apparently home health would not do this they referred him to the lab where he is apparently having a CBC checked because he is on iron infusions. We will try to get the lab work I ordered done there. He also did not get the x-ray he says he does not remember the discussion. The issue is that this large wound has probing areas to bone. He may have underlying  osteomyelitis again he was treated for this previously I think in 2019 12/15; the patient did have lab work done. Notable for iron deficiency anemia with a hemoglobin of 9.7 although he is following with hematology for this his CRP was 7.1 which is up from his previous value. sedimentation rate of 91 which is also higher. However he maintains a relatively high sedimentation rate. Most disturbingly was an albumin of 2.8 although this is also somewhat higher than the 2.2 we had previously. With some difficulty I think a home  health nurse actually made him some Dakin's solution he is using a Dakin's wet-to-dry to the large sacral wound. He has the area on the left upper thigh as well Bradley [name], Bradley [name] ([id number]) 06/17/2020; I follow this patient on a monthly basis. 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 At one point he had underlying osteomyelitis here and he received antibiotic therapy. I believe this was done through [facility] [place] We have been using palliative Dakin's solution wet-to-dry on this which is really This very large wound about the same with healthy surface. No active infection that is obvious. He has developed an area on the left posterior thigh and now a mirror image area on the right posterior thigh noted pressure injuries from the wheelchair. This occurs even though he has a Roho cushion which is apparently new I have been forwarded lab work from 05/15/2020. At that point his hemoglobin was 9.2 down from 13.8 on 10/5 this is microcytic hypochromic. He has known iron deficiency although the exact cause of this is never really been clear to me. He follows with hematology oncology and has been receiving IV iron he is supposed to follow-up in mid January and I told the patient this. From wound care point of view his albumin is 2.9 sedimentation rate is 74. His sedimentation rate has always been elevated in this range however his C-reactive protein was 82 which is way  above anything previously measured. I see that on 05/08/2020 this was 7.1 I am not sure why the rapid discrepancy. He is supposed to have an x-ray of his lower sacrum looking for evidence of osteomyelitis that he was previously treated for. Is possible he would also require more advanced imaging such as another C CT or MRI He comes in also with paperwork for a Clinitron air-fluidized bed with a trapeze bar. I asked him if he would be able to transfer in and out of this bed . I am not opposed to ordering it, but I do not want to make his transfers more difficult than they already are. He has a [employer] fast streamer 07/15/2020; he is managed to get his Clinitron air-fluidized bed. He has a new electric wheelchair with a wheelchair cushion. The x-ray I did showed some sacral bone destruction although this did not seem progressive him last time. He was treated for osteomyelitis in this area previously in 2018. His sedimentation rate is 91 CRP 7.1 albumin 2.8. We are using Dakin's wet-to-dry. He has [facility] home health 4/6; 36-monthfollow-up. He has a Clinitron air-fluidized bed. He has the wheelchair cushion. His major wound on the coccyx looks somewhat better including including both of uKoreamy nurse and my feeling that it is closed in somewhat. Wound is still too close to his anal opening to consider a wound VAC. At [facility] point [facility] but for 1 reason or another this never moved forward I was never really certain what the issue was. He has an area on the left upper thigh in the gluteal fold. This is a clean wound. 6/1; 299-monthollow-up. I follow this man on a palliative basis he has a chronic stage IV wound over the lower sacrum coccyx and surrounding buttocks. He has an air-fluidized bed and a wheelchair cushion but he spends more time in the wheelchair than he does off this area by his own admission. I do not think the area is really changed  that much although  it is probably deeper in 1 section. He does tell us that he was discharged from home health because of lack of change of the wound. This was [employer]. He was treated for underlying osteomyelitis several years ago and he may have underlying chronic osteomyelitis. I do not believe he ever got the x-ray I ordered 8/3; patient presents for his 76-monthfollow-up. He reports no issues or complaints today. He continues to use Dakin's wet-to-dry dressings to his sacrum and silver alginate to his left lateral thigh wound. He denies signs of infection. 10/5; patient presents for 268-monthollow-up. He has no issues or complaints today. He continues to use Dakin's wet-to-dry dressings to his sacrum. He states that the left lateral thigh wound is healed. He denies signs of infection. 12/7; patient presents for 2-14-monthllow-up. He states he would like to obtain a new air loss mattress bed. He states his current one is having technical difficulties. He currently is using Dakin's wet-to-dry dressings. He denies signs of infection. 2/1; patient presents for follow-up. He has no issues or complaints today. He currently uses Dakin's wet-to-dry dressings. He denies signs of infection. He has his new air loss mattress. 4/5; patient presents for follow-up. He has been using Dakin's wet-to-dry dressings. He has no issues or complaints today. He denies signs of infection. 11-10-2021 upon evaluation today patient's wound is doing better since I last saw him although it has been quite sometime since then to be perfectly honest. In fact I think it may have been even back in 2022 if I am not mistaken. Either way he has been seeing Dr. HofHeber Carolinaughly every 2 months per his request. His wound still though smaller does have a significant amount of thick scar tissue surrounding the edge which is making it very difficult to heal to be honest. His son is performing the dressing changes at home currently. 8/23; patient presents for  follow-up. He has no issues or complaints today. He has been doing Dakin's wet-to-dry dressings. He is not interested in any further surgical intervention for his sacral wound. He denies signs of infection. Objective Constitutional Vitals Time Taken: 2:19 PM, Temperature: 98.1 F, Pulse: 66 bpm, Respiratory Rate: 18 breaths/min, Blood Pressure: 149/77 mmHg. General Notes: Sacral ulcer: Granulation tissue throughout. No probing to bone. No obvious signs of infection. Integumentary (Hair, Skin) Wound #10 status is Open. Original cause of wound was Pressure Injury. The date acquired was: 06/07/2015. The wound has been in treatment 132 weeks. The wound is located on the Midline Sacrum. The wound measures 9cm length x 3cm width x 2.1cm depth; 21.206cm^2 area and Blunck, Bradley A. ([id number]^3 volume. There is muscle and Fat Layer (Subcutaneous Tissue) exposed. There is a large amount of sanguinous drainage noted. The wound margin is epibole. There is large (67-100%) red, pink granulation within the wound bed. There is a small (1-33%) amount of necrotic tissue within the wound bed including Adherent Slough. 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 No signs of surrounding infection. I recommended continuing with Dakin's wet-to-dry dressing and aggressive offloading. Follow-up in 3 months. He knows to call with any questions or concerns. Plan Follow-up Appointments: Other: - 2 months palliative care Bathing/ Shower/ Hygiene: May shower; gently cleanse wound with antibacterial soap, rinse and pat dry prior to dressing wounds Off-Loading: Roho cushion for wheelchair  Low air-loss mattress (Group 2) Turn and reposition every 2 hours WOUND #10: - Sacrum Wound Laterality: Midline Cleanser: Dakin  16 (oz) 0.25 1 x Per Day/30 Days Discharge Instructions: Use as directed. Primary Dressing: Gauze (Generic) 1 x Per Day/30 Days Discharge Instructions: As directed: moistened with Dakins Solution Secondary Dressing: ABD Pad 5x9 (in/in) (Dispense As Written) 1 x Per Day/30 Days Discharge Instructions: Cover with ABD pad Secured With: Medipore Tape - 35M Medipore H Soft Cloth Surgical Tape, 2x2 (in/yd) (Dispense As Written) 1 x Per Day/30 Days 1. Dakin's wet-to-dry dressings 2. Follow-up in 3 months 3. Aggressive offloading Electronic Signature(s) Signed: 01/26/2022 4:02:54 PM By: Kalman Shan DO Entered By: Kalman Shan on 01/26/2022 14:48:57 Bradley [name] ([id number]) -------------------------------------------------------------------------------- ROS/PFSH Details Patient Name: Bradley Loa A. Date of Service: 01/26/2022 2:15 PM Medical Record Number: [id number] Patient Account Number: [id number] Date of Birth/Sex: 1945-08-14 (76 y.o. M) Treating RN: Carlene Coria Primary Care Provider: [name] Other Clinician: Massie Kluver Referring Provider: [name] Treating Provider/Extender: Yaakov Guthrie in Treatment: 132 Label Progress Note Print Version as History and Physical for this encounter Information Obtained From Patient Eyes Medical History: Negative for: Cataracts; Glaucoma; Optic Neuritis Hematologic/Lymphatic Medical History: Positive for: Anemia Negative for: Hemophilia; Human Immunodeficiency Virus; Lymphedema; Sickle Cell Disease Respiratory Medical History: Negative for: Aspiration; Asthma; Chronic Obstructive Pulmonary Disease (COPD); Pneumothorax; Sleep Apnea; Tuberculosis Cardiovascular Medical History: Positive for: Hypertension Negative for: Angina; Arrhythmia; Congestive Heart Failure; Coronary Artery Disease; Deep Vein Thrombosis; Hypotension; Myocardial Infarction; Peripheral Arterial Disease; Peripheral Venous Disease;  Phlebitis; Vasculitis Gastrointestinal Medical History: Negative for: Cirrhosis ; Colitis; Crohnos; Hepatitis A; Hepatitis B; Hepatitis C Past Medical History Notes: colostomy Endocrine Medical History: Negative for: Type I Diabetes; Type II Diabetes Genitourinary Medical History: Negative for: End Stage Renal Disease Past Medical History Notes: frequent urination Immunological Medical History: Negative for: Lupus Erythematosus; Raynaudos; Scleroderma Integumentary (Skin) Medical History: Positive for: History of pressure wounds Musculoskeletal Medical History: Positive for: Rheumatoid Arthritis SHAYA, ALTAMURA A. ([id number]) Negative for: Gout; Osteoarthritis; Osteomyelitis Neurologic Medical History: Positive for: Paraplegia Negative for: Dementia; Neuropathy; Quadriplegia; Seizure Disorder Oncologic Medical History: Negative for: Received Chemotherapy; Received Radiation Psychiatric Medical History: Negative for: Anorexia/bulimia; Confinement Anxiety Immunizations Pneumococcal Vaccine: Received Pneumococcal Vaccination: No Immunization Notes: up to date Implantable Devices None Hospitalization / Surgery History Type of Hospitalization/Surgery December 2020 [facility] Family and Social History Cancer: Yes - Mother; Diabetes: No; Heart Disease: Yes - Mother,Father; Hereditary Spherocytosis: No; Hypertension: No; Kidney Disease: No; Lung Disease: No; Seizures: Yes - Child; Stroke: No; Thyroid Problems: No; Tuberculosis: No; Former smoker; Marital Status - Single; Alcohol Use: Never; Drug Use: No History; Caffeine Use: Moderate - coffee; Financial Concerns: No; Food, Clothing or Shelter Needs: No; Support System Lacking: No; Transportation Concerns: No Electronic Signature(s) Signed: 01/26/2022 4:02:54 PM By: Kalman Shan DO Signed: 01/27/2022 4:19:23 PM By: Carlene Coria RN Entered By: Kalman Shan on 01/26/2022 14:49:39 Bradley [name]  ([id number]) -------------------------------------------------------------------------------- [place] Details Patient Name: Bradley Loa A. Date of Service: 01/26/2022 Medical Record Number: [id number] Patient Account Number: [id number] Date of Birth/Sex: 02-06-1946 (76 y.o. 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 Facility Procedures CPT4 Code: 51700174 Description: 94496 - WOUND  CARE VISIT-LEV 3 EST PT Modifier: Quantity: 1 Physician Procedures CPT4 Code: 6770340 Description: 35248 - WC PHYS LEVEL 3 - EST PT Modifier: Quantity: 1 CPT4 Code: Description: ICD-10 Diagnosis Description L89.154 Pressure ulcer of sacral region, stage 4 M86.68 Other chronic osteomyelitis, other site G82.20 Paraplegia, unspecified L97.128 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

## 2022-01-31 DIAGNOSIS — K435 Parastomal hernia without obstruction or  gangrene: Secondary | ICD-10-CM | POA: Diagnosis not present

## 2022-02-02 DIAGNOSIS — S81809A Unspecified open wound, unspecified lower leg, initial encounter: Secondary | ICD-10-CM | POA: Diagnosis not present

## 2022-02-02 DIAGNOSIS — S31000A Unspecified open wound of lower back and pelvis without penetration into retroperitoneum, initial encounter: Secondary | ICD-10-CM | POA: Diagnosis not present

## 2022-02-02 DIAGNOSIS — N319 Neuromuscular dysfunction of bladder, unspecified: Secondary | ICD-10-CM | POA: Diagnosis not present

## 2022-02-02 DIAGNOSIS — Z933 Colostomy status: Secondary | ICD-10-CM | POA: Diagnosis not present

## 2022-02-02 DIAGNOSIS — L89159 Pressure ulcer of sacral region, unspecified stage: Secondary | ICD-10-CM | POA: Diagnosis not present

## 2022-02-02 DIAGNOSIS — K631 Perforation of intestine (nontraumatic): Secondary | ICD-10-CM | POA: Diagnosis not present

## 2022-02-03 DIAGNOSIS — Z933 Colostomy status: Secondary | ICD-10-CM | POA: Diagnosis not present

## 2022-02-03 DIAGNOSIS — N319 Neuromuscular dysfunction of bladder, unspecified: Secondary | ICD-10-CM | POA: Diagnosis not present

## 2022-02-03 DIAGNOSIS — K631 Perforation of intestine (nontraumatic): Secondary | ICD-10-CM | POA: Diagnosis not present

## 2022-02-03 DIAGNOSIS — L89159 Pressure ulcer of sacral region, unspecified stage: Secondary | ICD-10-CM | POA: Diagnosis not present

## 2022-02-03 DIAGNOSIS — S81809A Unspecified open wound, unspecified lower leg, initial encounter: Secondary | ICD-10-CM | POA: Diagnosis not present

## 2022-02-03 DIAGNOSIS — S31000A Unspecified open wound of lower back and pelvis without penetration into retroperitoneum, initial encounter: Secondary | ICD-10-CM | POA: Diagnosis not present

## 2022-02-14 DIAGNOSIS — N319 Neuromuscular dysfunction of bladder, unspecified: Secondary | ICD-10-CM | POA: Diagnosis not present

## 2022-02-14 DIAGNOSIS — L89159 Pressure ulcer of sacral region, unspecified stage: Secondary | ICD-10-CM | POA: Diagnosis not present

## 2022-02-14 DIAGNOSIS — S31000A Unspecified open wound of lower back and pelvis without penetration into retroperitoneum, initial encounter: Secondary | ICD-10-CM | POA: Diagnosis not present

## 2022-02-14 DIAGNOSIS — S81809A Unspecified open wound, unspecified lower leg, initial encounter: Secondary | ICD-10-CM | POA: Diagnosis not present

## 2022-02-14 DIAGNOSIS — Z933 Colostomy status: Secondary | ICD-10-CM | POA: Diagnosis not present

## 2022-02-14 DIAGNOSIS — K631 Perforation of intestine (nontraumatic): Secondary | ICD-10-CM | POA: Diagnosis not present

## 2022-02-17 DIAGNOSIS — H903 Sensorineural hearing loss, bilateral: Secondary | ICD-10-CM | POA: Diagnosis not present
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
## 2022-03-22 ENCOUNTER — Encounter: Payer: Self-pay | Admitting: Urology

## 2022-03-22 ENCOUNTER — Ambulatory Visit (INDEPENDENT_AMBULATORY_CARE_PROVIDER_SITE_OTHER): Payer: Medicare HMO | Admitting: Urology

## 2022-03-22 VITALS — BP 129/75 | HR 83 | Ht 72.0 in | Wt 209.0 lb

## 2022-03-22 DIAGNOSIS — R972 Elevated prostate specific antigen [PSA]: Secondary | ICD-10-CM

## 2022-03-22 DIAGNOSIS — E349 Endocrine disorder, unspecified: Secondary | ICD-10-CM

## 2022-03-22 DIAGNOSIS — N476 Balanoposthitis: Secondary | ICD-10-CM

## 2022-03-22 DIAGNOSIS — N319 Neuromuscular dysfunction of bladder, unspecified: Secondary | ICD-10-CM

## 2022-03-22 DIAGNOSIS — R319 Hematuria, unspecified: Secondary | ICD-10-CM

## 2022-03-28 ENCOUNTER — Inpatient Hospital Stay: Payer: Medicare HMO | Attending: Nurse Practitioner

## 2022-03-28 DIAGNOSIS — D508 Other iron deficiency anemias: Secondary | ICD-10-CM

## 2022-03-28 DIAGNOSIS — D509 Iron deficiency anemia, unspecified: Secondary | ICD-10-CM | POA: Insufficient documentation

## 2022-03-28 LAB — CBC WITH DIFFERENTIAL/PLATELET
Abs Immature Granulocytes: 0.02 10*3/uL (ref 0.00–0.07)
Basophils Absolute: 0 10*3/uL (ref 0.0–0.1)
Basophils Relative: 0 %
Eosinophils Absolute: 0.3 10*3/uL (ref 0.0–0.5)
Eosinophils Relative: 4 %
HCT: 41 % (ref 39.0–52.0)
Hemoglobin: 12.6 g/dL — ABNORMAL LOW (ref 13.0–17.0)
Immature Granulocytes: 0 %
Lymphocytes Relative: 34 %
Lymphs Abs: 2.5 10*3/uL (ref 0.7–4.0)
MCH: 24.9 pg — ABNORMAL LOW (ref 26.0–34.0)
MCHC: 30.7 g/dL (ref 30.0–36.0)
MCV: 80.9 fL (ref 80.0–100.0)
Monocytes Absolute: 0.6 10*3/uL (ref 0.1–1.0)
Monocytes Relative: 8 %
Neutro Abs: 4.1 10*3/uL (ref 1.7–7.7)
Neutrophils Relative %: 54 %
Platelets: 287 10*3/uL (ref 150–400)
RBC: 5.07 MIL/uL (ref 4.22–5.81)
RDW: 16.5 % — ABNORMAL HIGH (ref 11.5–15.5)
WBC: 7.6 10*3/uL (ref 4.0–10.5)
nRBC: 0 % (ref 0.0–0.2)

## 2022-03-28 LAB — IRON AND TIBC
Iron: 65 ug/dL (ref 45–182)
Saturation Ratios: 24 % (ref 17.9–39.5)
TIBC: 266 ug/dL (ref 250–450)
UIBC: 201 ug/dL

## 2022-03-28 LAB — FERRITIN: Ferritin: 254 ng/mL (ref 24–336)

## 2022-03-31 ENCOUNTER — Inpatient Hospital Stay (HOSPITAL_BASED_OUTPATIENT_CLINIC_OR_DEPARTMENT_OTHER): Payer: Medicare HMO | Admitting: Nurse Practitioner

## 2022-03-31 ENCOUNTER — Inpatient Hospital Stay: Payer: Medicare HMO

## 2022-03-31 ENCOUNTER — Encounter: Payer: Self-pay | Admitting: Nurse Practitioner

## 2022-03-31 VITALS — BP 148/80 | HR 80 | Temp 98.7°F | Resp 20

## 2022-03-31 DIAGNOSIS — D509 Iron deficiency anemia, unspecified: Secondary | ICD-10-CM | POA: Diagnosis not present

## 2022-03-31 DIAGNOSIS — D500 Iron deficiency anemia secondary to blood loss (chronic): Secondary | ICD-10-CM

## 2022-03-31 MED FILL — Iron Sucrose Inj 20 MG/ML (Fe Equiv): INTRAVENOUS | Qty: 10 | Status: AC

## 2022-03-31 NOTE — Progress Notes (Signed)
Hematology/Oncology Progress Note [phone] [phone]   Patient Care Team: [name], MD as PCP - General (Family Medicine) Earlie Server, MD as Consulting Physician (Oncology)   ASSESSMENT & PLAN:   No problem-specific Assessment & Plan notes found for this encounter.  No orders of the defined types were placed in this encounter.  Iron deficiency anemia Labs are reviewed and discussed with patient Hemoglobin has improved, iron sat has normalized. Ferritin is robust. Hold additional IV iron.  He has upcoming hernia surgery. May require iron post surgery but we can check counts if he's symptomatic. Otherwise, Follow up as scheduled.   Follow up  Lab in 4 months, prior to Dr Tasia Catchings + Venofer. All questions were answered. The patient knows to call the clinic with any problems, questions or concerns.  Beckey Rutter, DNP, AGNP-C [facility] [phone] (clinic) 03/31/2022   CHIEF COMPLAINTS/PURPOSE OF CONSULTATION:  Anemia  HISTORY OF PRESENTING ILLNESS:  Bradley [name] 76 y.o.  male with PMH listed as below who was referred to me for evaluation of iron deficiency anemia.  Patient is paraplegic, also has chronic pressure ulcer, recently just had colostomy. He self catheterizes.  He has BPH for which he take finasteride. He has not see his urologist for a while.  He was seen by [facility] for elevated ESR and his chronic hand joint tenderness. He was not considered to have rheumatoid arthritis.   INTERVAL HISTORY Bradley [name] 76 y.o.  male with PMH listed as below who was referred to me for evaluation of iron deficiency anemia.  Patient is paraplegic, also has chronic pressure ulcer, recently just had colostomy. He self catheterizes. He feels at baseline and denies complaints. Has surgery for hernia at end of November.   ROS:  Review of Systems  Constitutional:  Negative for fatigue.  HENT:  Negative.  Negative for lump/mass and  mouth sores.   Eyes: Negative.  Negative for eye problems.  Respiratory: Negative.  Negative for chest tightness and cough.   Cardiovascular: Negative.  Negative for chest pain.  Gastrointestinal: Negative.  Negative for abdominal distention and abdominal pain.       Colostomy  Endocrine: Negative.  Negative for hot flashes.  Genitourinary:  Negative for hematuria.        Self catherize  Musculoskeletal:  Positive for arthralgias. Negative for back pain.       Paraplegic  Skin:  Positive for wound. Negative for itching and rash.       Chronic pressure ulcer  Neurological: Negative.  Negative for dizziness.  Hematological: Negative.  Negative for adenopathy.  Psychiatric/Behavioral: Negative.  The patient is not nervous/anxious.     MEDICAL HISTORY:  Past Medical History:  Diagnosis Date   Anemia    Arthritis    Paralysis of both lower limbs ([place])    Sacral decubitus ulcer     SURGICAL HISTORY: Past Surgical History:  Procedure Laterality Date   COLONOSCOPY  06/06/2014   INCISION AND DRAINAGE OF WOUND N/A 06/04/2018   Procedure: IRRIGATION AND DEBRIDEMENT WOUND;  Surgeon: Olean Ree, MD;  Location: [facility] ORS;  Service: General;  Laterality: N/A;   TONSILLECTOMY      SOCIAL HISTORY: Social History   Socioeconomic History   Marital status: Widowed    Spouse name: Not on file   Number of children: Not on file   Years of education: Not on file   Highest education level: Not on file  Occupational History   Not  on file  Tobacco Use   Smoking status: Former    Packs/day: 0.50    Years: 5.00    Total pack years: 2.50    Types: Cigarettes    Quit date: 20    Years since quitting: 51.8   Smokeless tobacco: Never  Vaping Use   Vaping Use: Never used  Substance and Sexual Activity   Alcohol use: No   Drug use: No   Sexual activity: Not on file  Other Topics Concern   Not on file  Social History Narrative   Not on file   Social Determinants of Health    Financial Resource Strain: Not on file  Food Insecurity: Not on file  Transportation Needs: Not on file  Physical Activity: Not on file  Stress: Not on file  Social Connections: Not on file  Intimate Partner Violence: Not on file    FAMILY HISTORY: Family History  Problem Relation Age of Onset   Breast cancer Mother     ALLERGIES:  has No Known Allergies.  MEDICATIONS:  Current Outpatient Medications  Medication Sig Dispense Refill   acetaminophen (TYLENOL) 500 MG tablet Take 1,000 mg by mouth 3 (three) times daily.      amLODipine (NORVASC) 5 MG tablet Take 5 mg by mouth daily.     ferrous sulfate 325 (65 FE) MG EC tablet Take 1 tablet (325 mg total) by mouth daily. 30 tablet 5   fluticasone (FLONASE) 50 MCG/ACT nasal spray Place into the nose.     Multiple Vitamin (MULTI-VITAMINS) TABS Take 1 tablet by mouth daily.      polyethylene glycol powder (GLYCOLAX/MIRALAX) powder Take 17 g by mouth daily as needed for mild constipation.      vitamin C (ASCORBIC ACID) 500 MG tablet Take 500 mg by mouth 2 (two) times daily.     docusate sodium (COLACE) 100 MG capsule Take 1 capsule (100 mg total) by mouth daily. (Patient not taking: Reported on 03/31/2022) 90 capsule 1   finasteride (PROSCAR) 5 MG tablet Take 5 mg by mouth daily. (Patient not taking: Reported on 03/31/2022)     No current facility-administered medications for this visit.      Marland Kitchen  PHYSICAL EXAMINATION: ECOG PERFORMANCE STATUS: 2 - Symptomatic, <50% confined to bed Vitals:   03/31/22 1349  BP: (!) 148/80  Pulse: 80  Resp: 20  Temp: 98.7 F (37.1 C)  SpO2: 100%   There were no vitals filed for this visit.  Physical Exam Constitutional:      Appearance: He is not ill-appearing.  Cardiovascular:     Rate and Rhythm: Normal rate and regular rhythm.  Pulmonary:     Effort: Pulmonary effort is normal.  Abdominal:     Comments: colostomy  Musculoskeletal:     Comments: paraplegia  Skin:    General: Skin  is warm and dry.     Coloration: Skin is not pale.  Neurological:     Mental Status: He is alert and oriented to person, place, and time. Mental status is at baseline.  Psychiatric:        Mood and Affect: Mood normal.        Behavior: Behavior normal.       LABORATORY DATA:  I have reviewed the data as listed     Latest Ref Rng & Units 03/28/2022   10:41 AM 11/17/2021   10:48 AM 10/22/2021   11:08 AM  CBC  WBC 4.0 - 10.5 K/uL 7.6  7.2  8.0  Hemoglobin 13.0 - 17.0 g/dL 12.6  12.6  13.4   Hematocrit 39.0 - 52.0 % 41.0  40.7  41.2   Platelets 150 - 400 K/uL 287  271  314       Latest Ref Rng & Units 10/22/2021   11:08 AM 05/08/2020   11:03 AM 04/01/2020    1:35 PM  CMP  Glucose 65 - 99 mg/dL 75  81  103   BUN 7 - 25 mg/dL _0 Creatinine 0.70 - 1.28 mg/dL 0.47  0.46  0.45   Sodium 135 - 146 mmol/L 143  137  139   Potassium 3.5 - 5.3 mmol/L 3.7  4.0  3.3   Chloride 98 - 110 mmol/L 105  101  100   CO2 20 - 32 mmol/L _1 Calcium 8.6 - 10.3 mg/dL 9.1  8.6  7.9   Total Protein 6.1 - 8.1 g/dL 7.4  8.3  7.2   Total Bilirubin 0.2 - 1.2 mg/dL 0.4  0.5  0.6   Alkaline Phos 38 - 126 U/L  60  48   AST 10 - 35 U/L _2 ALT 9 - 46 U/L _3 Iron/TIBC/Ferritin/ %Sat    Component Value Date/Time   IRON 65 03/28/2022 1041   TIBC 266 03/28/2022 1041   FERRITIN 254 03/28/2022 1041   IRONPCTSAT 24 03/28/2022 1041

## 2022-04-01 DIAGNOSIS — S31000A Unspecified open wound of lower back and pelvis without penetration into retroperitoneum, initial encounter: Secondary | ICD-10-CM | POA: Diagnosis not present

## 2022-04-01 DIAGNOSIS — Z933 Colostomy status: Secondary | ICD-10-CM | POA: Diagnosis not present

## 2022-04-01 DIAGNOSIS — K631 Perforation of intestine (nontraumatic): Secondary | ICD-10-CM | POA: Diagnosis not present

## 2022-04-01 DIAGNOSIS — S81809A Unspecified open wound, unspecified lower leg, initial encounter: Secondary | ICD-10-CM | POA: Diagnosis not present

## 2022-04-01 DIAGNOSIS — N319 Neuromuscular dysfunction of bladder, unspecified: Secondary | ICD-10-CM | POA: Diagnosis not present

## 2022-04-01 DIAGNOSIS — L89159 Pressure ulcer of sacral region, unspecified stage: Secondary | ICD-10-CM | POA: Diagnosis not present

## 2022-04-20 ENCOUNTER — Encounter: Payer: Medicare HMO | Attending: Internal Medicine | Admitting: Internal Medicine

## 2022-04-20 DIAGNOSIS — L89153 Pressure ulcer of sacral region, stage 3: Secondary | ICD-10-CM | POA: Insufficient documentation

## 2022-04-20 DIAGNOSIS — L97128 Non-pressure chronic ulcer of left thigh with other specified severity: Secondary | ICD-10-CM | POA: Insufficient documentation

## 2022-04-20 DIAGNOSIS — G8220 Paraplegia, unspecified: Secondary | ICD-10-CM | POA: Insufficient documentation

## 2022-04-20 DIAGNOSIS — M8668 Other chronic osteomyelitis, other site: Secondary | ICD-10-CM | POA: Diagnosis not present

## 2022-04-20 DIAGNOSIS — L89154 Pressure ulcer of sacral region, stage 4: Secondary | ICD-10-CM | POA: Diagnosis not present

## 2022-04-21 DIAGNOSIS — R6889 Other general symptoms and signs: Secondary | ICD-10-CM | POA: Diagnosis not present

## 2022-04-21 NOTE — Progress Notes (Signed)
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 a fairly substantial stage 3 pressure ulcer over his lower sacrum and coccyx and extending into his bilateral gluteal areas left greater than right. He now has home health[facility] think [facility]. He is applying Medihoney, kerlix and border foam. He arrives today with the intake nurse reporting a large amount of drainage. The patient stated he put his dressing on it 7:00 this morning by the time he arrived here at 10 there was already a moderate to a large  amount of drainage. I once again reviewed his history. He had an attempted closure with myocutaneous flap earlier this year at [facility]. This did not go well. He was offered a diverting colostomy but refused. He is not a candidate for a wound VAC as the actual wound is precariously close to his anal opening. As mentioned he does have [facility] but miraculously this patient who is a paraplegic is actually changing the dressings himself. 04/12/16 patient presents today for a follow-up of his essentially large sacral pressure ulcer stage III. Nothing has changed dramatically since I last saw him about one month ago. He has seen Dr. Dellia Nims once the interim. With that being said patient's wound appears somewhat less macerated today compared to previous evaluations.  He still has no pain being a quadriplegic. 04-26-16 Mr. Goldsborough returns today for a violation of his stage III sacral pressure ulcer he denies any complaints concerns or issues over the past 2 weeks. He missed to changing dressing twice daily due to drainage although he states this is not an increase in drainage over the past 2 weeks. He does change his dressings independently. He admits to sitting in his motorized chair for no more than 2-3 hours at which time he transfers to bed and rotates lateral position. 05/10/16; Khylon Davies returns today for review of his stage III sacral pressure ulcer. He denies any concerns over the last 2 weeks although he seems to be running out of Aquacel Ag and on those days he uses Medihoney. He has [facility] was supplying his dressings. He still complains of drainage. He does his dressings independently. He has in his motorized chair for 2-3 hours that time other than that he offloads this. Dimensions of the wound are down 1 cm in both directions. He underwent an aggressive debridement on his last visit of thick circumferential skin and subcutaneous tissue. It is possible at some point in the future  he is going to need this done again 05/24/16; the patient returns today for review of his stage III sacral pressure ulcer. We have been using Aquacel Ag he tells me that he changes this up to twice a day. I'm not really certain of the reason for this frequency of changing. He has some involvement from the home health nurses but I think is doing most of the changing himself which I think because of his paraplegia would be a very difficult exercise. Nevertheless he states that there is "wetness". I am not sure if there is another dressing that we could easily changed that much. I'd wanted to change to [facility] but I'll need to have a sense of how frequent he would need to change this. 06/14/16; this is a patient returns for review of his stage III sacral pressure ulcer. We have been using Aquacel Ag and over the last 2 visits he has had extensive debridement so of the thick circumferential skin and subcutaneous tissue that surrounds this wound. In spite of this really absolutely no change in the condition of the wound warrants measurements. We have [facility] home health I believe changing the dressing on 3 occasions the patient states he does this on one occasion himself 06/28/16; this is a patient who has a fairly large stage III sacral pressure ulcer. I changed him to [facility] from Aquacel 2 weeks ago. He returns today in follow-up. In the meantime a nurse from [facility] has calledrequesting ordering of a wound VAC. He had this discussion before. The problem is the proximity of the lowest edge of this wound to the patient's anal opening roughly 3/4 of an inch. Can't see how this can be arranged. Apparently the nurse who is calling has a lot of experience, the question would be then when she is not available would be doing this. I would not have thought that this wound is not amenable to a wound VAC because of this reason 07/12/16; the patient comes in today and I have signed orders for a  wound VAC. The home health team through [facility] is convinced that he can benefit from this even though there is close proximity to his anal opening beneath the gluteal clefts. The patient does not have a bowel regimen but states he has a bowel  movement every 2 days this will also provide some problem with regards to the vac seal 07/26/16; the patient never did obtain a [place] wound VAC as he could not afford the $200 per month co-pay we have been using Hydrofera Blue now for 6 weeks or so. No major change in this wound at all. He is still not interested in the concept of plastic surgery. There changing the dressing every second day 08/09/16; the patient arrives with a wound precisely in the same situation. In keeping with the plan I outlined last time extensive debridement with an open curet the surface of this is not completely viable. Still has some degree of surrounding thick skin and subcutaneous tissue. No evidence of infection. Once again I have had a conversation with him about plastic surgery, he is simply not interested. 08/23/16; wound is really no different. Thick circumferential skin and subcutaneous tissue around the wound edge which is a lot better from debridement we did earlier in the year. The surface of the wound looks viable however with a curet there is definitely a gritty surface to this. We use Medihoney for a while, he Bradley [name], Bradley [name] ([id number]) [id number] Page 9 of 13 could not afford Santyl. I don't think we could get a supply of Iodoflex. He talks a little more positively about the concept of plastic surgery which I've gone over with him today 08/31/16;; patient arrives in clinic today with the wound surface really no different there is no changes in dimensions. I debrided today surface on the left upper side of this wound aggressively week ago there is no real change here no evidence of epithelialization. The problem with debridement in the clinic  is that he believes from this very liberally. We have been using Sorbact. 09/21/16; absolutely no change in the appearance or measurements of this wound. More recently I've been debrided in this aggressively and using sorbact to see if we could get to a better wound surface. Although this visually looks satisfactory, debridement reveals a very gritty surface to this. However even with this debridement and removal of thick nonviable skin and subcutaneous tissue from around the large amount of the circumference of this wound we have made absolutely no progress. This may be an offloading issue I'm just not completely certain. It has 2 close proximity in its inferior aspect to consider negative pressure therapy 10/26/16; READMISSION This patient called our clinic yesterday to report an odor in his wound. He had been to see plastic surgery at [facility] at our request after his last visit on 09/21/16; we have been seeing him for several months with a large stage III wound. He had been sent to general surgery for consideration of a colostomy, that appointment was not until mid June He comes in today with a temperature of 101. He is reporting an odor in the wound since last weekend. 01/10/17 Readmission: 01/10/17 On evaluation today it is noted that patient has been seen by plastic surgery at [facility] since he was last evaluated here. They did discuss with him the possibility of a flap according to the notes but unfortunately at this point he was not quite ready to proceed with surgery and instead wanted to give the Wound VAC a try. In the hospital they were able to get a good seal on the Wound VAC. Unfortunately since that time they have been having trouble in regard to his current home health company keeping a simple on the Wound VAC. He would like to switch to  a different home health company. With that being said it sounds as if the problem is that his wound VAC is not feeling at the lower portion of his back and he tells  me that he can take some of the clear plastic and put over that area when the sill breaks and it will correct it for time. He has no discomfort or pain which is good news. He has been treated with IV vancomycin since he was last seen here and has an appointment with a infectious disease specialist in two days on 01/12/17. Otherwise he was transferred back to [place] for continuing to monitor and manage is wound as she progresses with a Wound VAC for the time being. 01/17/17 on evaluation today patient continues to show evidence of slight improvement with the Wound VAC fortunately there's no evidence of infection or otherwise worsening condition in general. Nonetheless we were unable to get him switch to [facility] in regard to home help from his current company. I'm not sure the reasoning behind but for some reason he was not accepted as a patient with him. Continue to apply the Wound VAC which does still show that some maceration around the wound edges but the wound measurements were slightly improved. No fevers, chills, nausea, or vomiting noted at this time. 02/14/17; this patient I have not seen in 5 months although he has been readmitted to our clinic seen by our physician assistant Jeri Cos twice in early August. I have looked through [facility] notes care everywhere. The patient saw plastic surgery in May [Dr. Bhatt}. The patient was sent to general surgery and ultimately had a colostomy placed. On 11/29/16. This was after he was admitted to [facility] sometime in May. An MRI of the pelvis on 5/23 showed osteomyelitis of the coccyx. An attempt was made to drain fluid that was not successful. He was treated with empiric broad-spectrum antibiotics VAC/cefepime/Flagyl starting on 11/02/16 with plans for a 6 week course. According to their notes he was sent to a nursing home. Was last seen by Dr. Myriam Jacobson of plastic surgery on 12/28/16. The first part of the note is a long dissertation about the difficulties finding  adequate patients for flap closure of pressure ulcers. At that time the wound was noted to be stage IV based I think on underlying infection no exposed bone and healthy granulation tissue. 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 He was last seen by infectious disease 01/12/17 A Dr. Uvaldo Rising. His note says that Mr. Shrewsbury was not interested in a flap closure for referring a trial of the wound VAC. As previously anticipated the wound VAC could not be maintained as an outpatient in the community. He is now using something similar to a Dakin's wet to dry recommended by [facility] VASHE solution. He is placing this twice a day himself. This is almost s hopeless setting in terms of heeling 02/28/17; he is using a Dakin's wet to dry. Most of the wound surface looks satisfactory however the deeper area over his coccyx now has exposed bone I'm not sure if I noted this last week. 03/21/17; patient is usingVASHE solution wet to dry which I gather is a variation on Dakin solution. He has home health changing this 3 times a week the other days he does this himself. His appointment with plastic surgery 04/18/17; patient continues to use a variant of Dakin solution I believe. His wound continues to have a clean viable surface. The  2 areas of exposed bone in the center of this wound had closed over. He has an appointment with plastic surgery on December 5 at which time I hope that there'll be a plan for myocutaneous flap closure In looking through [place] link I couldn't find any more plastic surgery appointments. I did come across the fact that he is been followed by hematology for a microcytic hypochromic anemia. He had a reasonably normal looking hemoglobin electrophoresis. His iron level was 10 and according to the patient he is going for IV iron infusions starting tomorrow. He had a sedimentation rate of 74. More problematically from a pure wound care point of view his  albumin was 2.7 earlier this month 05/17/17; this is a patient I follow monthly. He has a large now stage IV wound over his bilateral buttocks with close proximity to his anal opening. More recently he has developed a large area with exposed bone in the center of this probably secondary to the underlying osteomyelitis E had in the summer. He also follows with Dr. Myriam Jacobson at [facility] who is plastic surgery. He had an appointment earlier this month and according to the patient Dr. Myriam Jacobson does not want to proceed with any attempted flap closure. Although I do not have current access to her note in care everywhere this is likely due to exposed bone. Again according to the patient they did a bone biopsy. He is still using a variant of Dakin solution changing twice a day. He has home health. The patient is not able to give me a firm answer about how long he spends on this in his wheelchair The patient also states that Dr. Myriam Jacobson wanted to reconsider a wound VAC. I really don't see this as a viable option at least not in the outpatient setting. The wound itself is frankly to close to his anal opening to maintain a seal. The last time we tried to do this home health was unable to manage it. It might be possible to maintain a wound VAC in this setting outside of the home such as a skilled nursing facility or an [place] however I am doubtful about this even in that setting **** READMISSION 09/21/17-He is here for evaluation of stage IV sacral ulcer. Since his last evaluation here in December he has completed treatment for sacral osteomyelitis. He was at [place] for IV therapy and NPWT dressing changes. He was discharged, with home health services, in February. He admits that while in the skilled facility he had "80%" success with maintaining dressing, since discharge he has had approximately "40%" success with maintaining wound VAC dressing. We discussed at length that this is not a safe or recommended  option. We will apply Dakin's wet to dry dressing daily and he will follow-up next week. He is accompanied today by his sister who is willing to assist in dressing changes; they will discuss the social issue as he feels he is capable of changing dressing daily when home health is not able. 09/28/17-He is here in follow-up evaluation for stage IV sacral pressure ulcer. He has been using the Dakin's wet-to-dry daily; he continues with home health. He is not accompanied by anyone at this visit. He will follow up in two weeks per his request/preference. 10/12/17 on evaluation today patient appears to be doing very well. The Dakin solution went to dry packings do seem to be helping him as far as the sacral wound is concerned I'm not seeing anything that has me more concerned  as far as infection or otherwise is concerned. Overall I'm pleased with the appearance of the wound. 10/26/17-He is here in follow up evaluation for a stage IV sacral ulcer. He continues with daily Dakin's wet-to-dry. He is voicing no complaints or concerns. He will follow-up in 2 weeks 11/16/17-He is here for follow up evaluation for a palliative stage IV sacral pressure ulcer. We will continue with Dakin's wet-to-dry. He will follow-up in 4 weeks. He is expressing concern/complaint regarding new bed that has arrived, stating he is unable to manipulate/maneuver it due to the bed crank being at the foot of Bradley [name], Bradley [name] ([id number]) [id number] Page 10 of 13 the bed. 12/14/17-He is here for evaluation for palliative stage IV sacral ulcer. He is voicing no complaints or concerns. We will continue with one-to-one ratio of saline and dakins. He will follow-up in 3 weeks 01/04/18-He is here in follow-up evaluation for palliative stage IV sacral ulcer. He is inquiring about reinstating the negative pressure wound therapy. We discussed at length that the negative pressure wound therapy in the home setting has not been  successful for him repeatedly with loss of cereal and unavailability of 24/7 help; reminded him that home health is not available 24/7 when loss of seal occurs. He does verbalize understanding to this and does not pursue. We also discussed the palliative nature of this ulcer (given no significant change/improvement in measurement/appearance, not a candidate for muscle flap per plastic surgery, and continued independent living) and that the goal is for maintenance, decrease in infection and minimizing/avoiding deterioration given that he is independent in his care, does not have home health and requires daily dressing changes secondary to drainage amount. He is inquiring about a wound clinic in [facility], I have informed him that I am unfamiliar with that clinic but that he is encouraged to seek another opinion if that is his desire. We will continue with dakins and he will follow up in three weeks 01/25/18-He is here in follow-up evaluation for palliative stage IV sacral ulcer. He continues with Dakin's/saline 1:1 mixture wet to dry dressing changes. He states he has an appointment at [facility] on 9/17 for evaluation of surgical intervention/closure of the sacral ulcer. He will follow-up here in 4 weeks Readmission: 07/16/2019 upon evaluation today patient appears to be doing really about the same as when he was previously seen here in the wound care center. He most recently was a patient of [place] back when she was still working here in the center but had been referred to [facility] for consideration of a flap. With that being said the surgeon there at [facility] stated that this was too large for her flap and they have been attempting to get this smaller in order to be able to proceed with a flap. Nonetheless unfortunately he has had a cycle of going back and forth between the osteomyelitis flaring and then sent him back and then making a little bit of progress only to be sent back again. It sounds like most recently they have  been using a Iodoflex type dressing at this point which does not seem to have done any harm by any means. With that being said this wound seems to be quite large for using Iodoflex throughout and subsequently I think he may do much better with the use of Vioxx moistened gauze which would be safe for the new tissue growing and also keep the wound quite nice and clean. The patient is not opposed to this he in fact states  that his home health nurse had mentioned this as a possibility as well. 07/23/2019 upon evaluation today patient actually appears to be doing very well in regard to his sacral wound. In fact this is much healthier the measurements not terribly different but again with a wound like this at home necessarily expect a a lot of change as far as the overall measurements are concerned in just 1 week's time. This is going be a much longer term process at this point. With that being said I do think that he is very healthy appearing as far as the base of the wound is concerned. 08/06/2019 upon evaluation today patient actually appears to be doing about the same with regard to his wound to be honest. There is really not a significant improvement overall based on what I am seeing today. Fortunately there is no signs of significant systemic infection. No fevers, chills, nausea, vomiting, or diarrhea. With that being said there is odor to the drainage from the wound and subsequently also what appears to be increased drainage based on what we are seeing today as well as what his home health nurse called [place] about that she was concerned with as well. 08/13/2019 upon evaluation today patient appears to be doing about the same at this point with regard to his wound although I think the dressing may be a little less drainage wise compared to what it was previous. Fortunately there is no signs of active infection at this time. No fevers, chills, nausea, vomiting, or diarrhea. He has been taking the antibiotic for  only 3 days. 08/23/2019 upon evaluation today patient appears to be doing not nearly as well with regard to his wound. In general really not making a lot of progress which is unfortunate. There does not appear to be any signs of active infection at this time. No fevers, chills, nausea, vomiting, or diarrhea. With that being said the patient unfortunately does seem to be experiencing continued epiboly around the edges of the wound as well as significant scar tissue he has been in the majority of his day sitting in his chair which is likely a big reason for all of this. Fortunately there is no signs of active infection at this time. No fevers, chills, nausea, vomiting, or diarrhea. 08/29/2019 upon evaluation today patient's wound actually appears to be showing some signs of improvement. The region of few areas of new skin growth around the edges of the wound even where he has some of the epiboly. This is actually good news and I think that he has been very aggressive and offloading over the past week since we showed him the pictures of his wound last week. There still may be a chance that he is going require some sharp debridement to clear away some of these edges but to be honest I think that is can be quite an undertaking. The main reason is that he has a lot of thickened scar tissue and it is good to bleed quite significantly in my opinion. The I think if organ to do that we may even need to have a portable or disposable cautery in order to make sure that we are able to get the area completely sealed up as far as bleeding is concerned. I do have one that we can utilize. However being that the wound looks so much better right now would like to give this 2 weeks to see how things stand and look at that point before making any additional recommendations. 09/12/2019  upon evaluation today patient appears to be making some progress as far as offloading is concerned. He still has a substantial wound  but nonetheless he tells me he is off loading much more aggressively at this point. This is obviously good news. No fevers, chills, nausea, vomiting, or diarrhea. 4/22; I have not seen this patient in quite some time. No major change in the condition of the wound or its wound volume. Surrounding maceration of the skin around the edges of the wound. The wound is fairly substantial. T close to the anal opening to consider a wound VAC. He had extensive trials of plastic o surgery at [facility] which really does not result in any improvement. His wound bed is however clean 10/10/2019 upon evaluation today patient appears to be doing well with regard to the wound in the sacral region. He has been tolerating the dressing changes without complication. Fortunately there is no signs of infection and he actually has some epithelial growth noted as well. He tells me has been trying to continue to offload this area which is excellent that is what he needs to do most as far as what he can have in his control. Otherwise I feel like the collagen with a saline moistened gauze behind has been beneficial for him. 5/20; collagen and normal saline wet-to-dry. Although the tissue when this large sacral wound looks reasonable there is been absolutely no benefit to the overall closure. He has macerated skin around this. He tells me that he spends a large part of the day up in the wheelchair. He still has home health 11/07/2019 upon evaluation today patient appears to be doing well with regard to his wounds at this point. Fortunately there is no signs of active infection at this time. Overall I feel like his sacral wound in general appears to be doing quite nicely. I am very pleased with overall how things are progressing and I feel like the patient is making excellent progress towards resolution. With that being said he still has a long ways to go before we expect to see this completely healed. 11/21/2019 upon evaluation  today patient appears to be doing about the same in regard to his sacral wound. Unfortunately he is really not making much in the way of improvement when I questioned him about how much time he is really spending sitting on his bottom he tells me that the majority of his day is mainly in that position though he does spend some time in bed. It does not sound like it is enough however neither does it look like it is enough. 12/24/2019 upon evaluation today patient appears to be doing really about the same there is no significant improvement overall in his wound bed. Last time he was seen here we did have a discussion about the possibility of seeing if I can get a surgeon to evaluate and potentially clear away some of the thickened edges of the wound in order to allow this wound to potentially have a chance of closing with a wound VAC. Right now we could not even get a wound VAC to seal with some of the rolled and thickened edges. With that being said elected to the patient to think about this to discuss today. At this point the patient does want to see if I could make the referral to potentially see if the surgeon would be able to perform this and subsequently he agrees to go to a skilled nursing facility following if they are  able to do this in order to allow for a wound VAC and to get this wound to heal more effectively and quickly. In my opinion the only way that I would recommend that he see the surgeon to clear away the edges would be if he is also in agreement with going to skilled nursing facility as I know he cannot appropriately offload at home to take care of this and that setting. 01/07/2020 on evaluation today patient appears to be doing about the same in regard to his wound. There is no signs of active infection at this time which is great news. All in all he is really doing about the same. We have made referral to surgery to see if they can potentially perform debridement to clear away some  of the scarred edging of the wound so we could potentially see about a wound VAC to try to help this heal more efficiently. Again we have discussed that if this occurs he would be in a skilled nursing facility following or release would need to be in order to appropriately offload. Nonetheless we are still in the process of working that referral. 01/21/2020 upon evaluation today patient appears to be doing about the same in regard to his sacral wound. He did see Dr. Lysle Pearl Dr. Ines Bloomer feeling was that there was not anything surgically he could offer at this point as he did not feel like that a wound VAC would likely be appropriate for the patient. With that being said he did recommend potential for trying to get the patient into a skilled nursing facility as an outpatient and they will get me working on that as well. Bradley [name], Bradley [name] ([id number]) [id number] Page 11 of 13 Fortunately there is no signs of active infection at this time. 11/10; this is a patient that has not been seen here in quite a bit of time while we were in our alternative venue. As well I have not seen this patient in probably over 2 years. As I remember things with him he has had this pressure ulcer encompassing his lower sacrum and coccyx and the surrounding buttocks for a prolonged period of time. When he first came here the surface looked fairly good and the wound was smaller we attempted a wound VAC but we could never keep this in place. Because of nonresponse of the wound to topical dressings we referred him to [facility] plastic surgery where preparation was being made for flap closure. Intact he had a colostomy however after that he was deemed to have 2 large wound for flap closure. He did have osteomyelitis in the coccyx area in 2019. He is using wet-to-dry he has home health. They are managing his ostomy supplies as well. The wound is substantially larger than what I remember. Versus last  time he was here to wound months or so ago he has an unstageable wound on the left upper thigh posteriorly. He tells me he is in his chair quite a bit especially recently 12/1; patient comes back to clinic. He did not get the lab work done apparently home health would not do this they referred him to the lab where he is apparently having a CBC checked because he is on iron infusions. We will try to get the lab work I ordered done there. He also did not get the x-ray he says he does not remember the discussion. The issue is that this large wound has probing areas to bone. He may have underlying osteomyelitis  again he was treated for this previously I think in 2019 12/15; the patient did have lab work done. Notable for iron deficiency anemia with a hemoglobin of 9.7 although he is following with hematology for this his CRP was 7.1 which is up from his previous value. sedimentation rate of 91 which is also higher. However he maintains a relatively high sedimentation rate. Most disturbingly was an albumin of 2.8 although this is also somewhat higher than the 2.2 we had previously. With some difficulty I think a home health nurse actually made him some Dakin's solution he is using a Dakin's wet-to-dry to the large sacral wound. He has the area on the left upper thigh as well 06/17/2020; I follow this patient on a monthly basis. 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 At one point he had underlying osteomyelitis here and he received antibiotic therapy. I believe this was done through [facility] We have been using palliative Dakin's solution wet-to-dry on this which is really This very large wound about the same with healthy surface. No active infection that is obvious. He has developed an area on the left posterior thigh and now a mirror image area on the right posterior thigh noted pressure injuries from the wheelchair. This occurs even though he has a Roho cushion which is apparently  new I have been forwarded lab work from 05/15/2020. At that point his hemoglobin was 9.2 down from 13.8 on 10/5 this is microcytic hypochromic. He has known iron deficiency although the exact cause of this is never really been clear to me. He follows with hematology oncology and has been receiving IV iron he is supposed to follow-up in mid January and I told the patient this. From wound care point of view his albumin is 2.9 sedimentation rate is 74. His sedimentation rate has always been elevated in this range however his C- reactive protein was 82 which is way above anything previously measured. I see that on 05/08/2020 this was 7.1 I am not sure why the rapid discrepancy. He is supposed to have an x-ray of his lower sacrum looking for evidence of osteomyelitis that he was previously treated for. Is possible he would also require more advanced imaging such as another C CT or MRI He comes in also with paperwork for a Clinitron air-fluidized bed with a trapeze bar. I asked him if he would be able to transfer in and out of this bed . I am not opposed to ordering it, but I do not want to make his transfers more difficult than they already are. He has a [employer] fast streamer 07/15/2020; he is managed to get his Clinitron air-fluidized bed. He has a new electric wheelchair with a wheelchair cushion. The x-ray I did showed some sacral bone destruction although this did not seem progressive him last time. He was treated for osteomyelitis in this area previously in 2018. His sedimentation rate is 91 CRP 7.1 albumin 2.8. We are using Dakin's wet-to-dry. He has [facility] home health 4/6; 66-monthfollow-up. He has a Clinitron air-fluidized bed. He has the wheelchair cushion. His major wound on the coccyx looks somewhat better including including both of uKoreamy nurse and my feeling that it is closed in somewhat. Wound is still too close to his anal opening to consider a wound VAC. At 1 point [facility] but for 1 reason or another this never moved forward I  was never really certain what the issue was. He has an area on the left upper thigh in the gluteal fold. This is a clean wound. 6/1; 87-monthfollow-up. I follow this man on a palliative basis he has a chronic stage IV wound over the lower sacrum coccyx and surrounding buttocks. He has an air-fluidized bed and a wheelchair cushion but he spends more time in the wheelchair than he does off this area by his own admission. I do not think the area is really changed that much although it is probably deeper in 1 section. He does tell uKoreathat he was discharged from home health because of lack of change of the wound. [place] He was treated for underlying osteomyelitis several years ago and he may have underlying chronic osteomyelitis. I do not believe he ever got the x-ray I ordered 8/3; patient presents for his 263-monthollow-up. He reports no issues or complaints today. He continues to use Dakin's wet-to-dry dressings to his sacrum and silver alginate to his left lateral thigh wound. He denies signs of infection. 10/5; patient presents for 2-3-monthllow-up. He has no issues or complaints today. He continues to use Dakin's wet-to-dry dressings to his sacrum. He states that the left lateral thigh wound is healed. He denies signs of infection. 12/7; patient presents for 2-m40-monthlow-up. He states he would like to obtain a new air loss mattress bed. He states his current one is having technical difficulties. He currently is using Dakin's wet-to-dry dressings. He denies signs of infection. 2/1; patient presents for follow-up. He has no issues or complaints today. He currently uses Dakin's wet-to-dry dressings. He denies signs of infection. He has his new air loss mattress. 4/5; patient presents for follow-up. He has been using Dakin's wet-to-dry dressings. He has no issues or complaints today. He denies signs of  infection. 11-10-2021 upon evaluation today patient's wound is doing better since I last saw him although it has been quite sometime since then to be perfectly honest. In fact I think it may have been even back in 2022 if I am not mistaken. Either way he has been seeing Dr. HoffHeber Carolinaghly every 2 months per his request. His wound still though smaller does have a significant amount of thick scar tissue surrounding the edge which is making it very difficult to heal to be honest. His son is performing the dressing changes at home currently. 8/23; patient presents for follow-up. He has no issues or complaints today. He has been doing Dakin's wet-to-dry dressings. He is not interested in any further surgical intervention for his sacral wound. He denies signs of infection. 11/15; Patient presents for follow up. He has been using Dakins wet to dry dressings. He has no issues or complaints. [address] StreetSKERMAN, [id number] Page 12 of 13 Constitutional Vitals Time Taken: 1:28 PM, Temperature: 97.8 F, Pulse: 71 bpm, Respiratory Rate: 16 breaths/min, Blood Pressure: 142/88 mmHg. General Notes: Sacral ulcer: Granulation tissue throughout. Not all areas of the wound bed appears healthy. No probing to bone. No obvious signs of infection. Integumentary (Hair, Skin) Wound #10 status is Open. Original cause of wound was Pressure Injury. The date acquired was: 06/07/2015. The wound has been in treatment 144 weeks. The wound is located on the Midline Sacrum. The wound measures 8.6cm length x 3cm width x 1.4cm depth; 20.263cm^2 area and 28.369cm^3 volume. There is muscle and Fat Layer (Subcutaneous Tissue) exposed. There is no tunneling or undermining noted. There is a large amount  of sanguinous drainage noted. The wound margin is epibole. There is large (67-100%) red, pink granulation within the wound bed. There is a small (1-33%) amount of necrotic tissue within the wound  bed including Adherent Slough. 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 I recommended continuing Dakin's wet-to-dry. Continue aggressive offloading. This is a palliative wound care case. Follow-up in 2 months. Patient knows to call with any questions or concerns. Plan Follow-up Appointments: Other: - 2 months palliative care Bathing/ Shower/ Hygiene: May shower; gently cleanse wound with antibacterial soap, rinse and pat dry prior to dressing wounds Off-Loading: Roho cushion for wheelchair Low air-loss mattress (Group 2) Turn and reposition every 2 hours WOUND #10: - Sacrum Wound Laterality: Midline Cleanser: Dakin 16 (oz) 0.25 1 x Per Day/30 Days Discharge Instructions: Use as directed. Prim Dressing: Gauze (Generic) 1 x Per Day/30 Days ary Discharge Instructions: As directed: moistened with Dakins Solution Secondary Dressing: ABD Pad 5x9 (in/in) (Dispense As Written) 1 x Per Day/30 Days Discharge Instructions: Cover with ABD pad Secured With: Medipore T - 54M Medipore H Soft Cloth Surgical T ape ape, 2x2 (in/yd) (Dispense As Written) 1 x Per Day/30 Days 1. Dakin's wet-to-dry dressings 2. Aggressive offloading 3. Follow-up in 2 months Electronic Signature(s) Signed: 04/20/2022 4:43:59 PM By: Bradley Shan DO Entered By: Bradley [name] on 04/20/2022 14:51:27 SuperBill Details -------------------------------------------------------------------------------- [name] ([id number]) [id number] Page 13 of 13 Patient Name: Date of Service: Bradley [name] MES A. 04/20/2022 Medical Record Number: [id number] Patient Account Number: [id number] Date of Birth/Sex: Treating RN: 09/13/1945 (76 y.o. 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

## 2022-04-22 NOTE — Progress Notes (Signed)
Bradley [name] ([id number]) [id number] Page 1 of 8 Visit Report for 04/20/2022 Arrival Information Details Patient Name: Date of Service: Bradley [name] [name] 04/20/2022 1:30 PM Medical Record Number: [id number] Patient Account Number: [id number] Date of Birth/Sex: Treating RN: April 06, 1946 (76 y.o. [name] Primary Care Keiandra Sullenger: [name] Other Clinician: Massie Kluver Referring Favour Aleshire: Treating Rithika Seel/Extender: Hermelinda Dellen in Treatment: 65 Visit Information History Since Last Visit All ordered tests and consults were completed: No Patient Arrived: Wheel Chair Added or deleted any medications: No Arrival Time: 13:27 Any new allergies or adverse reactions: No Transfer Assistance: Harrel Lemon Lift Had [name] fall or experienced change in No Patient Requires Transmission-Based Precautions: No activities of daily living that may affect Patient Has Alerts: No risk of falls: Signs or symptoms of abuse/neglect since last visito No Hospitalized since last visit: No Implantable device outside of the clinic excluding No cellular tissue based products placed in the center since last visit: Pain Present Now: No Electronic Signature(s) Signed: 04/21/2022 5:06:55 PM By: Massie Kluver Entered By: Massie Kluver on 04/20/2022 13:28:06 -------------------------------------------------------------------------------- Clinic Level of Care Assessment Details Patient Name: Date of Service: Bradley [name] [name] 04/20/2022 1:30 PM Medical Record Number: [id number] Patient Account Number: [id number] Date of Birth/Sex: Treating RN: August 25, 1945 (76 y.o. [name] Primary Care Bita Cartwright: [name] Other Clinician: Massie Kluver Referring Tomasa Dobransky: Treating Sahvannah Rieser/Extender: Hermelinda Dellen in Treatment: Kinder Assessment Items TOOL 4 Quantity Score '[]'$  - 0 Use when only an  EandM is performed on FOLLOW-UP visit ASSESSMENTS - Nursing Assessment / Reassessment X- 1 10 Reassessment of Co-morbidities (includes updates in patient status) X- 1 5 Reassessment of Adherence to Treatment Plan Bradley [name], Bradley [name] ([id number]) [phone].pdf Page 2 of 8 ASSESSMENTS - Wound and Skin [name] ssessment / Reassessment X - Simple Wound Assessment / Reassessment - one wound 1 5 '[]'$  - 0 Complex Wound Assessment / Reassessment - multiple wounds '[]'$  - 0 Dermatologic / Skin Assessment (not related to wound area) ASSESSMENTS - Focused Assessment '[]'$  - 0 Circumferential Edema Measurements - multi extremities '[]'$  - 0 Nutritional Assessment / Counseling / Intervention '[]'$  - 0 Lower Extremity Assessment (monofilament, tuning fork, pulses) '[]'$  - 0 Peripheral Arterial Disease Assessment (using hand held doppler) ASSESSMENTS - Ostomy and/or Continence Assessment and Care '[]'$  - 0 Incontinence Assessment and Management '[]'$  - 0 Ostomy Care Assessment and Management (repouching, etc.) PROCESS - Coordination of Care X - Simple Patient / Family Education for ongoing care 1 15 '[]'$  - 0 Complex (extensive) Patient / Family Education for ongoing care '[]'$  - 0 Staff obtains Programmer, systems, Records, T Results / Process Orders est '[]'$  - 0 Staff telephones HHA, Nursing Homes / Clarify orders / etc '[]'$  - 0 Routine Transfer to another Facility (non-emergent condition) '[]'$  - 0 Routine Hospital Admission (non-emergent condition) '[]'$  - 0 New Admissions / Biomedical engineer / Ordering NPWT Apligraf, etc. , '[]'$  - 0 Emergency Hospital Admission (emergent condition) X- 1 10 Simple Discharge Coordination '[]'$  - 0 Complex (extensive) Discharge Coordination PROCESS - Special Needs '[]'$  - 0 Pediatric / Minor Patient Management '[]'$  - 0 Isolation Patient Management '[]'$  - 0 Hearing / Language / Visual special needs '[]'$  - 0 Assessment of Community assistance (transportation, D/C planning, etc.) '[]'$  -  0 Additional assistance / Altered mentation '[]'$  - 0 Support Surface(s) Assessment (bed, cushion, seat, etc.) INTERVENTIONS - Wound Cleansing / Measurement X - Simple Wound Cleansing - one wound 1 5 '[]'$  -  0 Complex Wound Cleansing - multiple wounds X- 1 5 Wound Imaging (photographs - any number of wounds) '[]'$  - 0 Wound Tracing (instead of photographs) X- 1 5 Simple Wound Measurement - one wound '[]'$  - 0 Complex Wound Measurement - multiple wounds INTERVENTIONS - Wound Dressings '[]'$  - 0 Small Wound Dressing one or multiple wounds X- 1 15 Medium Wound Dressing one or multiple wounds '[]'$  - 0 Large Wound Dressing one or multiple wounds '[]'$  - 0 Application of Medications - topical '[]'$  - 0 Application of Medications - injection INTERVENTIONS - Miscellaneous '[]'$  - 0 External ear exam Bradley [name], Bradley [name] ([id number]) [id number] Page 3 of 8 '[]'$  - 0 Specimen Collection (cultures, biopsies, blood, body fluids, etc.) '[]'$  - 0 Specimen(s) / Culture(s) sent or taken to Lab for analysis '[]'$  - 0 Patient Transfer (multiple staff / Harrel Lemon Lift / Similar devices) '[]'$  - 0 Simple Staple / Suture removal (25 or less) '[]'$  - 0 Complex Staple / Suture removal (26 or more) '[]'$  - 0 Hypo / Hyperglycemic Management (close monitor of Blood Glucose) '[]'$  - 0 Ankle / Brachial Index (ABI) - do not check if billed separately X- 1 5 Vital Signs Has the patient been seen at the hospital within the last three years: Yes Total Score: 80 Level Of Care: New/Established - Level 3 Electronic Signature(s) Signed: 04/21/2022 5:06:55 PM By: Massie Kluver Entered By: Massie Kluver on 04/20/2022 14:01:44 -------------------------------------------------------------------------------- Encounter Discharge Information Details Patient Name: Date of Service: Bradley [name] [name] 04/20/2022 1:30 PM Medical Record Number: [id number] Patient Account Number: [id number] Date of Birth/Sex: Treating RN: 10/28/1945  (76 y.o. [name] Primary Care Jasline Buskirk: [name] Other Clinician: Massie Kluver Referring Nashali Ditmer: Treating Keia Rask/Extender: Hermelinda Dellen in Treatment: [phone] Encounter Discharge Information Items Discharge Condition: Stable Ambulatory Status: Wheelchair Discharge Destination: Home Transportation: Other Accompanied By: self Schedule Follow-up Appointment: Yes Clinical Summary of Care: Electronic Signature(s) Signed: 04/21/2022 5:06:55 PM By: Massie Kluver Entered By: Massie Kluver on 04/20/2022 14:22:09 -------------------------------------------------------------------------------- Lower Extremity Assessment Details Patient Name: Date of Service: Bradley [name] [name] 04/20/2022 1:30 PM Bradley [name] ([id number]) [phone].pdf Page 4 of 8 Medical Record Number: [id number] Patient Account Number: [id number] Date of Birth/Sex: Treating RN: 1945-07-13 (76 y.o. [name] Primary Care Gordon Carlson: [name] Other Clinician: Massie Kluver Referring Tyrone Balash: Treating Kathan Kirker/Extender: Hermelinda Dellen in Treatment: 144 Electronic Signature(s) Signed: 04/20/2022 5:08:09 PM By: Rosalio Loud MSN RN CNS WTA Signed: 04/21/2022 5:06:55 PM By: Massie Kluver Entered By: Massie Kluver on 04/20/2022 13:44:12 -------------------------------------------------------------------------------- Multi Wound Chart Details Patient Name: Date of Service: Bradley [name] [name] 04/20/2022 1:30 PM Medical Record Number: [id number] Patient Account Number: [id number] Date of Birth/Sex: Treating RN: 05-26-1946 (76 y.o. [name] Primary Care Sheryn Aldaz: [name] Other Clinician: Massie Kluver Referring Jahzier Villalon: Treating Jossalin Chervenak/Extender: Hermelinda Dellen in Treatment: 144 Vital Signs Height(in): Pulse(bpm): 39 Weight(lbs): Blood Pressure(mmHg):  142/88 Body Mass Index(BMI): Temperature(F): 97.8 Respiratory Rate(breaths/min): 16 [10:Photos: No Photos Midline Sacrum Wound Location: Pressure Injury Wounding Event: Pressure Ulcer Primary Etiology: Anemia, Hypertension, History of Comorbid History: pressure wounds, Rheumatoid Arthritis, Paraplegia 06/07/2015 Date Acquired: 144 Weeks of Treatment:  Open Wound Status: No Wound Recurrence: 8.6x3x1.4 Measurements L x W x D (cm) 20.263 [name] (cm) : rea 28.369 Volume (cm) : 60.70% % Reduction in [name] rea: 90.80% % Reduction in Volume: Category/Stage IV Classification: Large Exudate [name] mount: Sanguinous  Exudate Type: red Exudate Color: Epibole Wound Margin: Large (  67-100%) Granulation [name] mount: Red, Pink Granulation Quality: Small (1-33%) Necrotic [name] mount: Fat Layer (Subcutaneous Tissue): Yes [name] Exposed Structures: Muscle: Yes Fascia: No Tendon: No  Joint: No Bone: No None Epithelialization:] [[name]:[name] [name] [name] [name] [name] [name] [name] [name] [name] [name] [name] [name] [name] [name] [name] [name] [name] [name] [name] [name] [name] [name] [name]] Treatment Notes Bradley [name], Bradley [name] ([id number]) [id number] Page 5 of 8 Electronic Signature(s) Signed: 04/21/2022 5:06:55 PM By: Massie Kluver Entered By: Massie Kluver on 04/20/2022 13:44:31 -------------------------------------------------------------------------------- Multi-Disciplinary Care Plan Details Patient Name: Date of Service: Bradley [name] [name] 04/20/2022 1:30 PM Medical Record Number: [id number] Patient Account Number: [id number] Date of Birth/Sex: Treating RN: 16-Apr-1946 (76 y.o. [name] Primary Care Serena Petterson: [name] Other Clinician: Massie Kluver Referring Yolander Goodie: Treating Nollie Shiflett/Extender: Hermelinda Dellen in Treatment: [phone] Active Inactive Electronic Signature(s) Signed: 04/20/2022 5:08:09 PM By: Rosalio Loud MSN RN CNS WTA Signed: 04/21/2022 5:06:55 PM By: Massie Kluver Entered By: Massie Kluver on 04/20/2022  13:44:17 -------------------------------------------------------------------------------- Pain Assessment Details Patient Name: Date of Service: Bradley [name] [name] 04/20/2022 1:30 PM Medical Record Number: [id number] Patient Account Number: [id number] Date of Birth/Sex: Treating RN: [birth year] (76 y.o. [name] Primary Care Youssef Footman: [name] Other Clinician: Massie Kluver Referring Jajaira Ruis: Treating Aariv Medlock/Extender: Hermelinda Dellen in Treatment: [phone] Active Problems Location of Pain Severity and Description of Pain Patient Has Paino No Site Locations Bradley [name], Bradley [name] ([id number]) [id number] Page 6 of 8 Pain Management and Medication Current Pain Management: Electronic Signature(s) Signed: 04/20/2022 5:08:09 PM By: Rosalio Loud MSN RN CNS WTA Signed: 04/21/2022 5:06:55 PM By: Massie Kluver Entered By: Massie Kluver on 04/20/2022 13:30:29 -------------------------------------------------------------------------------- Patient/Caregiver Education Details Patient Name: Date of Service: Bradley [name] [name] 11/15/2023andnbsp1:30 PM Medical Record Number: [id number] Patient Account Number: [id number] Date of Birth/Gender: Treating RN: Jul 12, 1945 (76 y.o. [name] Primary Care Physician: [name] Other Clinician: Massie Kluver Referring Physician: Treating Physician/Extender: Hermelinda Dellen in Treatment: [phone] Education Assessment Education Provided To: Patient Education Topics Provided Wound/Skin Impairment: Handouts: Other: continue wound care as directed Methods: Explain/Verbal Responses: State content correctly Electronic Signature(s) Signed: 04/21/2022 5:06:55 PM By: Massie Kluver Entered By: Massie Kluver on 04/20/2022 14:02:13 Bradley [name] ([id number]) [id number] Page 7 of  8 -------------------------------------------------------------------------------- Wound Assessment Details Patient Name: Date of Service: Bradley [name] [name] 04/20/2022 1:30 PM Medical Record Number: [id number] Patient Account Number: [id number] Date of Birth/Sex: Treating RN: March 21, 1946 (77 y.o. [name] Primary Care Khyri Hinzman: [name] Other Clinician: Massie Kluver Referring Cyrena Kuchenbecker: Treating Trishna Cwik/Extender: Polo Riley Weeks in Treatment: 144 Wound Status Wound Number: 10 Primary Pressure Ulcer Etiology: Wound Location: Midline Sacrum Wound Open Wounding Event: Pressure Injury Status: Date Acquired: 06/07/2015 Comorbid Anemia, Hypertension, History of pressure wounds, Weeks Of Treatment: 144 History: Rheumatoid Arthritis, Paraplegia Clustered Wound: No Photos Wound Measurements Length: (cm) 8 Width: (cm) 3 Depth: (cm) 1 Area: (cm) Volume: (cm) .6 % Reduction in Area: 60.7% % Reduction in Volume: 90.8% .4 Epithelialization: None 20.263 Tunneling: No 28.369 Undermining: No Wound Description Classification: Category/Stage IV Wound Margin: Epibole Exudate Amount: Large Exudate Type: Sanguinous Exudate Color: red Foul Odor After Cleansing: No Slough/Fibrino Yes Wound Bed Granulation Amount: Large (67-100%) Exposed Structure Granulation Quality: Red, Pink Fascia Exposed: No Necrotic Amount: Small (1-33%) Fat Layer (Subcutaneous Tissue) Exposed: Yes Necrotic Quality: Adherent Slough Tendon Exposed: No Muscle Exposed: Yes Necrosis of Muscle: No Joint Exposed: No Bone Exposed: No Treatment Notes Wound #  10 (Sacrum) Wound Laterality: Midline Cleanser Dakin 16 (oz) 0.25 Discharge Instruction: Use as directed. Bradley [name], Bradley [name] ([id number]) [id number] Page 8 of 8 Peri-Wound Care Topical Primary Dressing Gauze Discharge Instruction: As directed: moistened with Dakins Solution Secondary  Dressing ABD Pad 5x9 (in/in) Discharge Instruction: Cover with ABD pad Secured With [place] H Soft Cloth Surgical T ape ape, 2x2 (in/yd) Compression Wrap Compression Stockings Add-Ons Electronic Signature(s) Signed: 04/20/2022 5:08:09 PM By: Rosalio Loud MSN RN CNS WTA Signed: 04/21/2022 5:06:55 PM By: Massie Kluver Entered By: Massie Kluver on 04/20/2022 13:46:15 -------------------------------------------------------------------------------- Vitals Details Patient Name: Date of Service: Bradley [name] [name] 04/20/2022 1:30 PM Medical Record Number: [id number] Patient Account Number: [id number] Date of Birth/Sex: Treating RN: Apr 19, 1946 (76 y.o. [name] Primary Care Dhruvan Gullion: [name] Other Clinician: Massie Kluver Referring Agnes Probert: Treating Joscelin Fray/Extender: Hermelinda Dellen in Treatment: 144 Vital Signs Time Taken: 13:28 Temperature (F): 97.8 Pulse (bpm): 71 Respiratory Rate (breaths/min): 16 Blood Pressure (mmHg): 142/88 Reference Range: 80 - 120 mg / dl Electronic Signature(s) Signed: 04/21/2022 5:06:55 PM By: Massie Kluver Entered By: Massie Kluver on 04/20/2022 13:30:25

## 2022-04-26 DIAGNOSIS — R6889 Other general symptoms and signs: Secondary | ICD-10-CM | POA: Diagnosis not present

## 2022-04-26 DIAGNOSIS — K435 Parastomal hernia without obstruction or  gangrene: Secondary | ICD-10-CM | POA: Diagnosis not present

## 2022-05-03 DIAGNOSIS — M85871 Other specified disorders of bone density and structure, right ankle and foot: Secondary | ICD-10-CM | POA: Diagnosis not present

## 2022-05-03 DIAGNOSIS — N319 Neuromuscular dysfunction of bladder, unspecified: Secondary | ICD-10-CM | POA: Diagnosis not present

## 2022-05-03 DIAGNOSIS — L89159 Pressure ulcer of sacral region, unspecified stage: Secondary | ICD-10-CM | POA: Diagnosis not present

## 2022-05-03 DIAGNOSIS — Z933 Colostomy status: Secondary | ICD-10-CM | POA: Diagnosis not present

## 2022-05-03 DIAGNOSIS — I10 Essential (primary) hypertension: Secondary | ICD-10-CM | POA: Diagnosis not present

## 2022-05-03 DIAGNOSIS — S91201A Unspecified open wound of right great toe with damage to nail, initial encounter: Secondary | ICD-10-CM | POA: Diagnosis not present

## 2022-05-03 DIAGNOSIS — R338 Other retention of urine: Secondary | ICD-10-CM | POA: Diagnosis not present

## 2022-05-03 DIAGNOSIS — G8220 Paraplegia, unspecified: Secondary | ICD-10-CM | POA: Diagnosis not present

## 2022-05-03 DIAGNOSIS — N400 Enlarged prostate without lower urinary tract symptoms: Secondary | ICD-10-CM | POA: Diagnosis not present

## 2022-05-03 DIAGNOSIS — L89154 Pressure ulcer of sacral region, stage 4: Secondary | ICD-10-CM | POA: Diagnosis not present

## 2022-05-03 DIAGNOSIS — L03031 Cellulitis of right toe: Secondary | ICD-10-CM | POA: Diagnosis not present

## 2022-05-03 DIAGNOSIS — K592 Neurogenic bowel, not elsewhere classified: Secondary | ICD-10-CM | POA: Diagnosis not present

## 2022-05-03 DIAGNOSIS — K433 Parastomal hernia with obstruction, without gangrene: Secondary | ICD-10-CM | POA: Diagnosis not present

## 2022-05-03 DIAGNOSIS — K435 Parastomal hernia without obstruction or  gangrene: Secondary | ICD-10-CM | POA: Diagnosis not present

## 2022-05-09 DIAGNOSIS — M85871 Other specified disorders of bone density and structure, right ankle and foot: Secondary | ICD-10-CM | POA: Diagnosis not present

## 2022-05-11 DIAGNOSIS — L89159 Pressure ulcer of sacral region, unspecified stage: Secondary | ICD-10-CM | POA: Diagnosis not present

## 2022-05-11 DIAGNOSIS — Z933 Colostomy status: Secondary | ICD-10-CM | POA: Diagnosis not present

## 2022-05-11 DIAGNOSIS — S81809A Unspecified open wound, unspecified lower leg, initial encounter: Secondary | ICD-10-CM | POA: Diagnosis not present

## 2022-05-11 DIAGNOSIS — S31000A Unspecified open wound of lower back and pelvis without penetration into retroperitoneum, initial encounter: Secondary | ICD-10-CM | POA: Diagnosis not present

## 2022-05-11 DIAGNOSIS — K631 Perforation of intestine (nontraumatic): Secondary | ICD-10-CM | POA: Diagnosis not present

## 2022-05-11 DIAGNOSIS — N319 Neuromuscular dysfunction of bladder, unspecified: Secondary | ICD-10-CM | POA: Diagnosis not present

## 2022-05-20 ENCOUNTER — Encounter: Payer: Self-pay | Admitting: Emergency Medicine

## 2022-05-20 ENCOUNTER — Emergency Department
Admission: EM | Admit: 2022-05-20 | Discharge: 2022-05-20 | Disposition: A | Discharge status: Home / Self Care | Payer: Medicare HMO | Attending: Emergency Medicine | Admitting: Emergency Medicine

## 2022-05-20 ENCOUNTER — Other Ambulatory Visit: Payer: Self-pay

## 2022-05-20 DIAGNOSIS — L89899 Pressure ulcer of other site, unspecified stage: Secondary | ICD-10-CM | POA: Diagnosis not present

## 2022-05-20 DIAGNOSIS — S99921A Unspecified injury of right foot, initial encounter: Secondary | ICD-10-CM | POA: Diagnosis not present

## 2022-05-20 NOTE — ED Triage Notes (Signed)
Pt to ED for red spot to right foot that pt noticed this morning. Pt states he is paralyzed from waist down, cannot feel it.  Pt is not diabetic.

## 2022-05-20 NOTE — ED Provider Notes (Signed)
   [facility] Provider Note    Event Date/Time   First MD Initiated Contact with Patient 05/20/22 1358     (approximate)   History   Foot Injury   HPI  Bradley [name] is a 76 y.o. male who is wheelchair-bound and presents with a concern of a red spot to his right foot.  Patient does not have any sensation in his lower extremities.  This morning he noticed a red spot on the bottom of his right heel     Physical Exam   Triage Vital Signs: ED Triage Vitals  Enc Vitals Group     BP 05/20/22 1314 (!) 160/75     Pulse Rate 05/20/22 1314 66     Resp 05/20/22 1313 16     Temp 05/20/22 1313 98.3 F (36.8 C)     Temp src --      SpO2 05/20/22 1314 100 %     Weight 05/20/22 1314 96.6 kg (213 lb)     Height 05/20/22 1314 1.88 m ('6\' 2"'$ )     Head Circumference --      Peak Flow --      Pain Score 05/20/22 1314 0     Pain Loc --      Pain Edu? --      Excl. in [place]? --     Most recent vital signs: Vitals:   05/20/22 1313 05/20/22 1314  BP:  (!) 160/75  Pulse:  66  Resp: 16   Temp: 98.3 F (36.8 C)   SpO2:  100%     General: Awake, no distress.  CV:  Good peripheral perfusion.  Resp:  Normal effort.  Abd:  No distention.  Other:  Right foot: Faint circular area of erythema to the medial right heel, no skin breakdown, suspicious for possible pressure injury, not consistent with cellulitis or infection   ED Results / Procedures / Treatments   Labs (all labs ordered are listed, but only abnormal results are displayed) Labs Reviewed - No data to display   EKG     RADIOLOGY     PROCEDURES:  Critical Care performed:   Procedures   MEDICATIONS ORDERED IN ED: Medications - No data to display   IMPRESSION / MDM / [place] / ED COURSE  I reviewed the triage vital signs and the nursing notes. Patient's presentation is most consistent with acute, uncomplicated illness.   Exam is most consistent with mild pressure  injury, the patient does report that he has been sleeping differently recently and that he will has likely been experiencing more pressure.  He will adjust his sleeping and monitor carefully       FINAL CLINICAL IMPRESSION(S) / ED DIAGNOSES   Final diagnoses:  Pressure injury of skin of right foot, unspecified injury stage     Rx / DC Orders   ED Discharge Orders     None        Note:  This document was prepared using Dragon voice recognition software and may include unintentional dictation errors.   Lavonia Drafts, MD 05/20/22 [phone]

## 2022-05-26 DIAGNOSIS — R6889 Other general symptoms and signs: Secondary | ICD-10-CM | POA: Diagnosis not present

## 2022-05-26 DIAGNOSIS — K435 Parastomal hernia without obstruction or  gangrene: Secondary | ICD-10-CM | POA: Diagnosis not present

## 2022-06-01 DIAGNOSIS — L89159 Pressure ulcer of sacral region, unspecified stage: Secondary | ICD-10-CM | POA: Diagnosis not present

## 2022-06-01 DIAGNOSIS — K631 Perforation of intestine (nontraumatic): Secondary | ICD-10-CM | POA: Diagnosis not present

## 2022-06-01 DIAGNOSIS — S31000A Unspecified open wound of lower back and pelvis without penetration into retroperitoneum, initial encounter: Secondary | ICD-10-CM | POA: Diagnosis not present

## 2022-06-01 DIAGNOSIS — N319 Neuromuscular dysfunction of bladder, unspecified: Secondary | ICD-10-CM | POA: Diagnosis not present

## 2022-06-01 DIAGNOSIS — Z933 Colostomy status: Secondary | ICD-10-CM | POA: Diagnosis not present

## 2022-06-01 DIAGNOSIS — S81809A Unspecified open wound, unspecified lower leg, initial encounter: Secondary | ICD-10-CM | POA: Diagnosis not present

## 2022-06-04 DIAGNOSIS — K631 Perforation of intestine (nontraumatic): Secondary | ICD-10-CM | POA: Diagnosis not present

## 2022-06-04 DIAGNOSIS — S81809A Unspecified open wound, unspecified lower leg, initial encounter: Secondary | ICD-10-CM | POA: Diagnosis not present

## 2022-06-04 DIAGNOSIS — S31000A Unspecified open wound of lower back and pelvis without penetration into retroperitoneum, initial encounter: Secondary | ICD-10-CM | POA: Diagnosis not present

## 2022-06-04 DIAGNOSIS — L89159 Pressure ulcer of sacral region, unspecified stage: Secondary | ICD-10-CM | POA: Diagnosis not present

## 2022-06-04 DIAGNOSIS — Z933 Colostomy status: Secondary | ICD-10-CM | POA: Diagnosis not present

## 2022-06-04 DIAGNOSIS — N319 Neuromuscular dysfunction of bladder, unspecified: Secondary | ICD-10-CM | POA: Diagnosis not present

## 2022-06-15 ENCOUNTER — Ambulatory Visit: Payer: Medicare HMO | Admitting: Internal Medicine

## 2022-06-22 DIAGNOSIS — I10 Essential (primary) hypertension: Secondary | ICD-10-CM | POA: Diagnosis not present

## 2022-06-22 DIAGNOSIS — Z136 Encounter for screening for cardiovascular disorders: Secondary | ICD-10-CM | POA: Diagnosis not present

## 2022-06-29 ENCOUNTER — Ambulatory Visit: Payer: Medicare HMO | Admitting: Internal Medicine

## 2022-07-05 DIAGNOSIS — Z0000 Encounter for general adult medical examination without abnormal findings: Secondary | ICD-10-CM | POA: Diagnosis not present

## 2022-07-05 DIAGNOSIS — Z1331 Encounter for screening for depression: Secondary | ICD-10-CM | POA: Diagnosis not present

## 2022-07-05 DIAGNOSIS — I10 Essential (primary) hypertension: Secondary | ICD-10-CM | POA: Diagnosis not present

## 2022-07-05 DIAGNOSIS — E785 Hyperlipidemia, unspecified: Secondary | ICD-10-CM | POA: Diagnosis not present

## 2022-07-05 DIAGNOSIS — D649 Anemia, unspecified: Secondary | ICD-10-CM | POA: Diagnosis not present

## 2022-07-13 ENCOUNTER — Encounter: Payer: Medicare HMO | Attending: Internal Medicine | Admitting: Internal Medicine

## 2022-07-13 DIAGNOSIS — L89154 Pressure ulcer of sacral region, stage 4: Secondary | ICD-10-CM | POA: Diagnosis not present

## 2022-07-13 DIAGNOSIS — L97128 Non-pressure chronic ulcer of left thigh with other specified severity: Secondary | ICD-10-CM | POA: Diagnosis not present

## 2022-07-13 DIAGNOSIS — G8220 Paraplegia, unspecified: Secondary | ICD-10-CM | POA: Diagnosis not present

## 2022-07-13 DIAGNOSIS — M8668 Other chronic osteomyelitis, other site: Secondary | ICD-10-CM | POA: Diagnosis not present

## 2022-07-13 NOTE — Progress Notes (Addendum)
DRAXTON, [name] ([id number]) [id number] Page 1 of 8 Visit Report for 07/13/2022 Arrival Information Details Patient Name: Date of Service: Bradley [name] [name] 07/13/2022 11:15 [name] Medical Record Number: [id number] Patient Account Number: [id number] Date of Birth/Sex: Treating RN: Bradley [name][birth year] (77 y.o. Bradley [name]) Bradley [name] Primary Care Bradley [name]: Bradley [name] Other Clinician: Referring Bradley [name]: Treating Bradley [name]/Extender: Bradley [name] in Treatment: 78 Visit Information History Since Last Visit Added or deleted any medications: No Patient Arrived: Wheel Chair Any new allergies or adverse reactions: No Arrival Time: 11:09 Had [name] fall or experienced change in No Accompanied By: self activities of daily living that may affect Transfer Assistance: Bradley [name] risk of falls: Patient Identification Verified: Yes Signs or symptoms of abuse/neglect since last visito No Secondary Verification Process Completed: Yes Hospitalized since last visit: No Patient Requires Transmission-Based Precautions: No Implantable device outside of the clinic excluding No Patient Has Alerts: No cellular tissue based products placed in the center since last visit: Has Dressing in Place as Prescribed: Yes Pain Present Now: No Electronic Signature(s) Signed: 07/13/2022 11:22:04 AM By: Bradley Coria RN Entered By: Bradley [name] on 07/13/2022 11:22:04 -------------------------------------------------------------------------------- Clinic Level of Care Assessment Details Patient Name: Date of Service: Bradley [name] [name] 07/13/2022 11:15 [name] Medical Record Number: [id number] Patient Account Number: [id number] Date of Birth/Sex: Treating RN: [birth year]Bradley [name] (77 y.o. Bradley [name]) Bradley [name] Primary Care Bradley [name]: Bradley [name] Other Clinician: Referring Bradley [name]: Treating Bradley [name]/Extender: Bradley [name] in Treatment: [place]  Assessment Items TOOL 4 Quantity Score X- 1 0 Use when only an EandM is performed on FOLLOW-UP visit ASSESSMENTS - Nursing Assessment / Reassessment X- 1 10 Reassessment of Co-morbidities (includes updates in patient status) X- 1 5 Reassessment of Adherence to Treatment Plan Bradley, [name] [name] ([id number]) [phone].pdf Page 2 of 8 ASSESSMENTS - Wound and Skin [name] ssessment / Reassessment X - Simple Wound Assessment / Reassessment - one wound 1 5 []$  - 0 Complex Wound Assessment / Reassessment - multiple wounds []$  - 0 Dermatologic / Skin Assessment (not related to wound area) ASSESSMENTS - Focused Assessment []$  - 0 Circumferential Edema Measurements - multi extremities []$  - 0 Nutritional Assessment / Counseling / Intervention []$  - 0 Lower Extremity Assessment (monofilament, tuning fork, pulses) []$  - 0 Peripheral Arterial Disease Assessment (using hand held doppler) ASSESSMENTS - Ostomy and/or Continence Assessment and Care []$  - 0 Incontinence Assessment and Management []$  - 0 Ostomy Care Assessment and Management (repouching, etc.) PROCESS - Coordination of Care []$  - 0 Simple Patient / Family Education for ongoing care []$  - 0 Complex (extensive) Patient / Family Education for ongoing care []$  - 0 Staff obtains Programmer, systems, Records, T Results / Process Orders est []$  - 0 Staff telephones HHA, Nursing Homes / Clarify orders / etc []$  - 0 Routine Transfer to another Facility (non-emergent condition) []$  - 0 Routine Hospital Admission (non-emergent condition) []$  - 0 New Admissions / Biomedical engineer / Ordering NPWT Apligraf, etc. , []$  - 0 Emergency Hospital Admission (emergent condition) X- 1 10 Simple Discharge Coordination []$  - 0 Complex (extensive) Discharge Coordination PROCESS - Special Needs []$  - 0 Pediatric / Minor Patient Management []$  - 0 Isolation Patient Management []$  - 0 Hearing / Language / Visual special needs []$  - 0 Assessment  of Community assistance (transportation, D/C planning, etc.) []$  - 0 Additional assistance / Altered mentation []$  - 0 Support Surface(s) Assessment (bed, cushion, seat, etc.) INTERVENTIONS - Wound Cleansing / Measurement X -  Simple Wound Cleansing - one wound 1 5 []$  - 0 Complex Wound Cleansing - multiple wounds X- 1 5 Wound Imaging (photographs - any number of wounds) []$  - 0 Wound Tracing (instead of photographs) X- 1 5 Simple Wound Measurement - one wound []$  - 0 Complex Wound Measurement - multiple wounds INTERVENTIONS - Wound Dressings X - Small Wound Dressing one or multiple wounds 1 10 []$  - 0 Medium Wound Dressing one or multiple wounds []$  - 0 Large Wound Dressing one or multiple wounds []$  - 0 Application of Medications - topical []$  - 0 Application of Medications - injection INTERVENTIONS - Miscellaneous []$  - 0 External ear exam Bradley [name], Bradley [name] ([id number]) [phone].pdf Page 3 of 8 []$  - 0 Specimen Collection (cultures, biopsies, blood, [name] fluids, etc.) []$  - 0 Specimen(s) / Culture(s) sent or taken to Lab for analysis []$  - 0 Patient Transfer (multiple staff / Bradley [name] / Similar devices) []$  - 0 Simple Staple / Suture removal (25 or less) []$  - 0 Complex Staple / Suture removal (26 or more) []$  - 0 Hypo / Hyperglycemic Management (close monitor of Blood Glucose) []$  - 0 Ankle / Brachial Index (ABI) - do not check if billed separately X- 1 5 Vital Signs Has the patient been seen at the hospital within the last three years: Yes Total Score: 60 Level Of Care: New/Established - Level 2 Electronic Signature(s) Signed: 07/15/2022 12:08:06 PM By: Bradley Coria RN Entered By: Bradley [name] on 07/13/2022 12:01:41 -------------------------------------------------------------------------------- Encounter Discharge Information Details Patient Name: Date of Service: Bradley [name] [name] 07/13/2022 11:15 [name] Medical Record Number: [id number] Patient Account  Number: [id number] Date of Birth/Sex: Treating RN: Bradley [name][birth year] (77 y.o. Bradley [name] Primary Care Rolene Andrades: Bradley [name] Other Clinician: Referring Pressley Barsky: Treating Criss Pallone/Extender: Bradley [name] in Treatment: 156 Encounter Discharge Information Items Discharge Condition: Stable Ambulatory Status: Wheelchair Discharge Destination: Home Transportation: Private Auto Accompanied By: self Schedule Follow-up Appointment: Yes Clinical Summary of Care: Electronic Signature(s) Signed: 07/13/2022 12:02:32 PM By: Bradley Coria RN Entered By: Bradley [name] on 07/13/2022 12:02:32 -------------------------------------------------------------------------------- Lower Extremity Assessment Details Patient Name: Date of Service: Bradley [name] [name] 07/13/2022 11:15 [name] Bradley [name], Bradley [name] ([id number]) [phone].pdf Page 4 of 8 Medical Record Number: [id number] Patient Account Number: [id number] Date of Birth/Sex: Treating RN: Bradley [name][birth year] (77 y.o. Bradley [name]) Bradley [name] Primary Care Efton Thomley: Bradley [name] Other Clinician: Referring Sevan Mcbroom: Treating Rheana Casebolt/Extender: Polo Riley Weeks in Treatment: 156 Electronic Signature(s) Signed: 07/13/2022 11:23:37 AM By: Bradley Coria RN Entered By: Bradley [name] on 07/13/2022 11:23:36 -------------------------------------------------------------------------------- Multi Wound Chart Details Patient Name: Date of Service: Bradley [name] [name] 07/13/2022 11:15 [name] Medical Record Number: [id number] Patient Account Number: [id number] Date of Birth/Sex: Treating RN: Bradley [name][birth year] (77 y.o. Bradley [name]) Bradley [name] Primary Care Ramandeep Arington: Bradley [name] Other Clinician: Referring Kinleigh Nault: Treating Rella Egelston/Extender: Bradley [name] in Treatment: 156 Vital Signs Height(in): Pulse(bpm): 37 Weight(lbs): Blood Pressure(mmHg): 129/86 [name] Mass Index(BMI): Temperature(F):  98.1 Respiratory Rate(breaths/min): 18 [10:Photos:] [[name]:[name]] Midline Sacrum [name] [name] Wound Location: Pressure Injury [name] [name] Wounding Event: Pressure Ulcer [name] [name] Primary Etiology: Anemia, Hypertension, History of [name] [name] Comorbid History: pressure wounds, Rheumatoid Arthritis, Paraplegia 06/07/2015 [name] [name] Date Acquired: 156 [name] [name] Weeks of Treatment: Open [name] [name] Wound Status: No [name] [name] Wound Recurrence: 7x3x3 [name] [name] Measurements L x W x D (cm) 16.493 [name] [name] [name] (cm) : rea 49.48 [name] [name] Volume (cm) : 68.00% [name] [name] % Reduction in [name] rea:  84.00% [name] [name] % Reduction in Volume: Category/Stage IV [name] [name] Classification: Medium [name] [name] Exudate [name] mount: Serosanguineous [name] [name] Exudate Type: red, brown [name] [name] Exudate Color: Epibole [name] [name] Wound Margin: Large (67-100%) [name] [name] Granulation [name] mount: Red, Pink [name] [name] Granulation Quality: Small (1-33%) [name] [name] Necrotic [name] mount: Fat Layer (Subcutaneous Tissue): Yes [name] [name] Exposed Structures: Muscle: Yes Fascia: No Bradley [name], Bradley [name] [name] ([id number]) [id number] Page 5 of 8 Tendon: No Joint: No Bone: No None [name] [name] Epithelialization: Treatment Notes Electronic Signature(s) Signed: 07/13/2022 11:23:41 AM By: Bradley Coria RN Entered By: Bradley [name] on 07/13/2022 11:23:41 -------------------------------------------------------------------------------- [place] Details Patient Name: Date of Service: Bradley [name] [name] 07/13/2022 11:15 [name] Medical Record Number: [id number] Patient Account Number: [id number] Date of Birth/Sex: Treating RN: [birth year], Bradley [name] (77 y.o. Bradley [name] Primary Care Lawsyn Heiler: Bradley [name] Other Clinician: Referring Druscilla Petsch: Treating Jassica Zazueta/Extender: Bradley [name] in Treatment: 156 Active Inactive Electronic Signature(s) Signed: 07/13/2022 11:24:08 AM By: Bradley Coria RN Entered By: Bradley [name] on 07/13/2022  11:24:08 -------------------------------------------------------------------------------- Pain Assessment Details Patient Name: Date of Service: Bradley [name] [name] 07/13/2022 11:15 [name] Medical Record Number: [id number] Patient Account Number: [id number] Date of Birth/Sex: Treating RN: [birth year] (77 y.o. Bradley [name] Primary Care Zera Markwardt: Bradley [name] Other Clinician: Referring Enrika Aguado: Treating Doyne Ellinger/Extender: Bradley [name] in Treatment: 156 Active Problems Location of Pain Severity and Description of Pain Patient Has Paino No Site Locations HENRIETTA, TRELA [name] ([id number]) [id number] Page 6 of 8 Pain Management and Medication Current Pain Management: Electronic Signature(s) Signed: 07/13/2022 11:22:37 AM By: Bradley Coria RN Entered By: Bradley [name] on 07/13/2022 11:22:37 -------------------------------------------------------------------------------- Patient/Caregiver Education Details Patient Name: Date of Service: Bradley [name] [name] 2/7/2024andnbsp11:15 [name] Medical Record Number: [id number] Patient Account Number: [id number] Date of Birth/Gender: Treating RN: Bradley [name][birth year] (77 y.o. Bradley [name] Primary Care Physician: Bradley [name] Other Clinician: Referring Physician: Treating Physician/Extender: Bradley [name] in Treatment: 58 Education Assessment Education Provided To: Patient Education Topics Provided Wound/Skin Impairment: Methods: Explain/Verbal Responses: State content correctly Electronic Signature(s) Signed: 07/15/2022 12:08:06 PM By: Bradley Coria RN Entered By: Bradley [name] on 07/13/2022 11:24:00 [name] ([id number] Page 7 of 8 -------------------------------------------------------------------------------- Wound Assessment Details Patient Name: Date of Service: Bradley [name] [name] 07/13/2022 11:15 [name] Medical Record Number:  [id number] Patient Account Number: [id number] Date of Birth/Sex: Treating RN: Bradley [name][birth year] (77 y.o. Bradley [name]) Bradley [name] Primary Care Caio Devera: Bradley [name] Other Clinician: Referring Kelsey Edman: Treating Mikenna Bunkley/Extender: Polo Riley Weeks in Treatment: 156 Wound Status Wound Number: 10 Primary Pressure Ulcer Etiology: Wound Location: Midline Sacrum Wound Open Wounding Event: Pressure Injury Status: Date Acquired: 06/07/2015 Comorbid Anemia, Hypertension, History of pressure wounds, Weeks Of Treatment: 156 History: Rheumatoid Arthritis, Paraplegia Clustered Wound: No Photos Wound Measurements Length: (cm) 7 Width: (cm) 3 Depth: (cm) 3 Area: (cm) Volume: (cm) % Reduction in Area: 68% % Reduction in Volume: 84% Epithelialization: None 16.493 Tunneling: No 49.48 Undermining: No Wound Description Classification: Category/Stage IV Wound Margin: Epibole Exudate Amount: Medium Exudate Type: Serosanguineous Exudate Color: red, brown Foul Odor After Cleansing: No Slough/Fibrino Yes Wound Bed Granulation Amount: Large (67-100%) Exposed Structure Granulation Quality: Red, Pink Fascia Exposed: No Necrotic Amount: Small (1-33%) Fat Layer (Subcutaneous Tissue) Exposed: Yes Necrotic Quality: Adherent Slough Tendon Exposed: No Muscle Exposed: Yes Necrosis of Muscle: No Joint Exposed: No Bone Exposed: No Treatment Notes Wound #10 (Sacrum) Wound Laterality: Midline Cleanser Dakin 16 (oz) 0.25  Discharge Instruction: Use as directed. ANSON, MIRACLE ([id number]) [id number] Page 8 of 8 Peri-Wound Care Topical Primary Dressing Gauze Discharge Instruction: As directed: moistened with Dakins Solution Secondary Dressing ABD Pad 5x9 (in/in) Discharge Instruction: Cover with ABD pad Secured With [place] H Soft Cloth Surgical T ape ape, 2x2 (in/yd) Compression Wrap Compression Stockings Add-Ons Electronic  Signature(s) Signed: 07/13/2022 11:23:24 AM By: Bradley Coria RN Entered By: Bradley [name] on 07/13/2022 11:23:23 -------------------------------------------------------------------------------- Vitals Details Patient Name: Date of Service: Bradley [name] [name] 07/13/2022 11:15 [name] Medical Record Number: [id number] Patient Account Number: [id number] Date of Birth/Sex: Treating RN: Bradley [name][birth year] (77 y.o. Bradley [name] Primary Care Glendia Olshefski: Bradley [name] Other Clinician: Referring Milanie Rosenfield: Treating Caedan Sumler/Extender: Bradley [name] in Treatment: 156 Vital Signs Time Taken: 11:15 Temperature (F): 98.1 Pulse (bpm): 79 Respiratory Rate (breaths/min): 18 Blood Pressure (mmHg): 129/86 Reference Range: 80 - 120 mg / dl Electronic Signature(s) Signed: 07/13/2022 11:22:26 AM By: Bradley Coria RN Entered By: Bradley [name] on 07/13/2022 11:22:26

## 2022-07-13 NOTE — Progress Notes (Signed)
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 He has a fairly substantial stage 3 pressure ulcer over his lower sacrum and coccyx and extending into his bilateral gluteal areas left greater than right. He now has home health[facility]. He is applying Medihoney, kerlix and border foam. He  arrives today with the intake nurse reporting a large amount of drainage. The patient stated he put his dressing on it 7:00 this morning by the time he arrived here at 10 there was already a moderate to a large amount of drainage. I once again reviewed his history. He had an attempted closure with myocutaneous flap earlier this year at [facility] This did not go well. He was offered a diverting colostomy but refused. He is not a candidate for a wound VAC as the actual wound is precariously close to his anal opening. As mentioned he does have [facility] but miraculously this patient who is a paraplegic is actually changing the dressings himself. 04/12/16 patient presents today for a follow-up of his essentially large sacral pressure ulcer stage III.  Nothing has changed dramatically since I last saw him about one month ago. He has seen Dr. Dellia Nims once the interim. With that being said patient's wound appears somewhat less macerated today compared to previous evaluations. He still has no pain being a quadriplegic. 04-26-16 Mr. Bartosiewicz returns today for a violation of his stage III sacral pressure ulcer he denies any complaints concerns or issues over the past 2 weeks. He missed to changing dressing twice daily due to drainage although he states this is not an increase in drainage over the past 2 weeks. He does change his dressings independently. He admits to sitting in his motorized chair for no more than 2-3 hours at which time he transfers to bed and rotates lateral position. 05/10/16; Torrell Krutz returns today for review of his stage III sacral pressure ulcer. He denies any concerns over the last 2 weeks although he seems to be running out of Aquacel Ag and on those days he uses Medihoney. He has [facility] was supplying his dressings. He still complains of drainage. He does his dressings independently. He has in his motorized chair for 2-3 hours that time other than that he offloads this.  Dimensions of the wound are down 1 cm in both directions. He underwent an aggressive debridement on his last visit of thick circumferential skin and subcutaneous tissue. It is possible at some point in the future he is going to need this done again 05/24/16; the patient returns today for review of his stage III sacral pressure ulcer. We have been using Aquacel Ag he tells me that he changes this up to twice a day. I'm not really certain of the reason for this frequency of changing. He has some involvement from the home health nurses but I think is doing most of the changing himself which I think because of his paraplegia would be a very difficult exercise. Nevertheless he states that there is "wetness". I am not sure if there is another dressing that we could easily changed that much. I'd wanted to change to [facility] but I'll need to have a sense of how frequent he would need to change this. 06/14/16; this is a patient returns for review of his stage III sacral pressure ulcer. We have been using Aquacel Ag and over the last 2 visits he has had extensive debridement so of the thick circumferential skin and subcutaneous tissue that surrounds this wound. In spite of this really absolutely no change in the condition of the wound warrants measurements. We have [facility] home health I believe changing the dressing on 3 occasions the patient states he does this on one occasion himself 06/28/16; this is a patient who has a fairly large stage III sacral pressure ulcer. I changed him to [facility] from Aquacel 2 weeks ago. He returns today in follow-up. In the meantime a nurse from [facility] has calledrequesting ordering of a wound VAC. He had this discussion before. The problem is the proximity of the lowest edge of this wound to the patient's anal opening roughly 3/4 of an inch. Can't see how this can be arranged. Apparently the nurse who is calling has a lot of experience, the question would be  then when she is not available would be doing this. I would not have thought that this wound is not amenable to a wound VAC because of this reason 07/12/16; the patient comes in today and I have signed orders for a wound VAC. The home health team through [facility]  is convinced that he can benefit from this even though there is close proximity to his anal opening beneath the gluteal clefts. The patient does not have a bowel regimen but states he has a bowel movement every 2 days this will also provide some problem with regards to the vac seal 07/26/16; the patient never did obtain a [place] wound VAC as he could not afford the $200 per month co-pay we have been using Hydrofera Blue now for 6 weeks or so. No major change in this wound at all. He is still not interested in the concept of plastic surgery. There changing the dressing every second day 08/09/16; the patient arrives with a wound precisely in the same situation. In keeping with the plan I outlined last time extensive debridement with an open curet the surface of this is not completely viable. Still has some degree of surrounding thick skin and subcutaneous tissue. No evidence of infection. Once again I have had a conversation with him about plastic surgery, he is simply not interested. 08/23/16; wound is really no different. Thick circumferential skin and subcutaneous tissue around the wound edge which is a lot better from debridement we did earlier in the year. The surface of the wound looks viable however with a curet there is definitely a gritty surface to this. We use Medihoney for a while, he JEREN, DUFRANE ([id number]) [id number] Page 9 of 13 could not afford Santyl. I don't think we could get a supply of Iodoflex. He talks a little more positively about the concept of plastic surgery which I've gone over with him today 08/31/16;; patient arrives in clinic today with the wound surface really no different there is no  changes in dimensions. I debrided today surface on the left upper side of this wound aggressively week ago there is no real change here no evidence of epithelialization. The problem with debridement in the clinic is that he believes from this very liberally. We have been using Sorbact. 09/21/16; absolutely no change in the appearance or measurements of this wound. More recently I've been debrided in this aggressively and using sorbact to see if we could get to a better wound surface. Although this visually looks satisfactory, debridement reveals a very gritty surface to this. However even with this debridement and removal of thick nonviable skin and subcutaneous tissue from around the large amount of the circumference of this wound we have made absolutely no progress. This may be an offloading issue I'm just not completely certain. It has 2 close proximity in its inferior aspect to consider negative pressure therapy 10/26/16; READMISSION This patient called our clinic yesterday to report an odor in his wound. He had been to see plastic surgery at [facility] at our request after his last visit on 09/21/16; we have been seeing him for several months with a large stage III wound. He had been sent to general surgery for consideration of a colostomy, that appointment was not until mid June He comes in today with a temperature of 101. He is reporting an odor in the wound since last weekend. 01/10/17 Readmission: 01/10/17 On evaluation today it is noted that patient has been seen by plastic surgery at [facility] since he was last evaluated here. They did discuss with him the possibility of a flap according to the notes but unfortunately at this point he was not quite ready to proceed with surgery and instead wanted to give the Wound VAC a try. In the hospital they were able to get a  good seal on the Wound VAC. Unfortunately since that time they have been having trouble in regard to his current home health company keeping a  simple on the Wound VAC. He would like to switch to a different home health company. With that being said it sounds as if the problem is that his wound VAC is not feeling at the lower portion of his back and he tells me that he can take some of the clear plastic and put over that area when the sill breaks and it will correct it for time. He has no discomfort or pain which is good news. He has been treated with IV vancomycin since he was last seen here and has an appointment with a infectious disease specialist in two days on 01/12/17. Otherwise he was transferred back to [place] for continuing to monitor and manage is wound as she progresses with a Wound VAC for the time being. 01/17/17 on evaluation today patient continues to show evidence of slight improvement with the Wound VAC fortunately there's no evidence of infection or otherwise worsening condition in general. Nonetheless we were unable to get him switch to [facility] in regard to home help from his current company. I'm not sure the reasoning behind but for some reason he was not accepted as a patient with him. Continue to apply the Wound VAC which does still show that some maceration around the wound edges but the wound measurements were slightly improved. No fevers, chills, nausea, or vomiting noted at this time. 02/14/17; this patient I have not seen in 5 months although he has been readmitted to our clinic seen by our physician assistant Jeri Cos twice in early August. I have looked through [facility] notes care everywhere. The patient saw plastic surgery in May [Dr. Bhatt}. The patient was sent to general surgery and ultimately had a colostomy placed. On 11/29/16. This was after he was admitted to [facility] sometime in May. An MRI of the pelvis on 5/23 showed osteomyelitis of the coccyx. An attempt was made to drain fluid that was not successful. He was treated with empiric broad-spectrum antibiotics VAC/cefepime/Flagyl starting on 11/02/16 with plans  for a 6 week course. According to their notes he was sent to a nursing home. Was last seen by Dr. Myriam Jacobson of plastic surgery on 12/28/16. The first part of the note is a long dissertation about the difficulties finding adequate patients for flap closure of pressure ulcers. At that time the wound was noted to be stage IV based I think on underlying infection no exposed bone and healthy granulation tissue. 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 He was last seen by infectious disease 01/12/17 A Dr. Uvaldo Rising. His note says that Mr. Acree was not interested in a flap closure for referring a trial of the wound VAC. As previously anticipated the wound VAC could not be maintained as an outpatient in the community. He is now using something similar to a Dakin's wet to dry recommended by [facility] VASHE solution. He is placing this twice a day himself. This is almost s hopeless setting in terms of heeling 02/28/17; he is using a Dakin's wet to dry. Most of the wound surface looks satisfactory however the deeper area over his coccyx now has exposed bone I'm not sure if I noted this last week. 03/21/17; patient is usingVASHE solution wet to dry which I gather is a variation on Dakin solution. He has home health changing this 3 times  a week the other days he does this himself. His appointment with plastic surgery 04/18/17; patient continues to use a variant of Dakin solution I believe. His wound continues to have a clean viable surface. The 2 areas of exposed bone in the center of this wound had closed over. He has an appointment with plastic surgery on December 5 at which time I hope that there'll be a plan for myocutaneous flap closure In looking through [facility] link I couldn't find any more plastic surgery appointments. I did come across the fact that he is been followed by hematology for a microcytic hypochromic anemia. He had a reasonably normal looking hemoglobin  electrophoresis. His iron level was 10 and according to the patient he is going for IV iron infusions starting tomorrow. He had a sedimentation rate of 74. More problematically from a pure wound care point of view his albumin was 2.7 earlier this month 05/17/17; this is a patient I follow monthly. He has a large now stage IV wound over his bilateral buttocks with close proximity to his anal opening. More recently he has developed a large area with exposed bone in the center of this probably secondary to the underlying osteomyelitis E had in the summer. He also follows with Dr. Myriam Jacobson at [facility] who is plastic surgery. He had an appointment earlier this month and according to the patient Dr. Myriam Jacobson does not want to proceed with any attempted flap closure. Although I do not have current access to her note in care everywhere this is likely due to exposed bone. Again according to the patient they did a bone biopsy. He is still using a variant of Dakin solution changing twice a day. He has home health. The patient is not able to give me a firm answer about how long he spends on this in his wheelchair The patient also states that Dr. Myriam Jacobson wanted to reconsider a wound VAC. I really don't see this as a viable option at least not in the outpatient setting. The wound itself is frankly to close to his anal opening to maintain a seal. The last time we tried to do this home health was unable to manage it. It might be possible to maintain a wound VAC in this setting outside of the home such as a skilled nursing facility or an [facility] however I am doubtful about this even in that setting **** READMISSION 09/21/17-He is here for evaluation of stage IV sacral ulcer. Since his last evaluation here in December he has completed treatment for sacral osteomyelitis. He was at [facility] for IV therapy and NPWT dressing changes. He was discharged, with home health services, in February. He admits that while in the  skilled facility he had "80%" success with maintaining dressing, since discharge he has had approximately "40%" success with maintaining wound VAC dressing. We discussed at length that this is not a safe or recommended option. We will apply Dakin's wet to dry dressing daily and he will follow-up next week. He is accompanied today by his sister who is willing to assist in dressing changes; they will discuss the social issue as he feels he is capable of changing dressing daily when home health is not able. 09/28/17-He is here in follow-up evaluation for stage IV sacral pressure ulcer. He has been using the Dakin's wet-to-dry daily; he continues with home health. He is not accompanied by anyone at this visit. He will follow up in two weeks per his request/preference. 10/12/17 on evaluation today patient  appears to be doing very well. The Dakin solution went to dry packings do seem to be helping him as far as the sacral wound is concerned I'm not seeing anything that has me more concerned as far as infection or otherwise is concerned. Overall I'm pleased with the appearance of the wound. 10/26/17-He is here in follow up evaluation for a stage IV sacral ulcer. He continues with daily Dakin's wet-to-dry. He is voicing no complaints or concerns. He will follow-up in 2 weeks 11/16/17-He is here for follow up evaluation for a palliative stage IV sacral pressure ulcer. We will continue with Dakin's wet-to-dry. He will follow-up in 4 weeks. He is expressing concern/complaint regarding new bed that has arrived, stating he is unable to manipulate/maneuver it due to the bed crank being at the foot of BENZION, MESTA A ([id number]) [id number] Page 10 of 13 the bed. 12/14/17-He is here for evaluation for palliative stage IV sacral ulcer. He is voicing no complaints or concerns. We will continue with one-to-one ratio of saline and dakins. He will follow-up in 3 weeks 01/04/18-He is here in  follow-up evaluation for palliative stage IV sacral ulcer. He is inquiring about reinstating the negative pressure wound therapy. We discussed at length that the negative pressure wound therapy in the home setting has not been successful for him repeatedly with loss of cereal and unavailability of 24/7 help; reminded him that home health is not available 24/7 when loss of seal occurs. He does verbalize understanding to this and does not pursue. We also discussed the palliative nature of this ulcer (given no significant change/improvement in measurement/appearance, not a candidate for muscle flap per plastic surgery, and continued independent living) and that the goal is for maintenance, decrease in infection and minimizing/avoiding deterioration given that he is independent in his care, does not have home health and requires daily dressing changes secondary to drainage amount. He is inquiring about a wound clinic in [facility], I have informed him that I am unfamiliar with that clinic but that he is encouraged to seek another opinion if that is his desire. We will continue with dakins and he will follow up in three weeks 01/25/18-He is here in follow-up evaluation for palliative stage IV sacral ulcer. He continues with Dakin's/saline 1:1 mixture wet to dry dressing changes. He states he has an appointment at [facility] on 9/17 for evaluation of surgical intervention/closure of the sacral ulcer. He will follow-up here in 4 weeks Readmission: 07/16/2019 upon evaluation today patient appears to be doing really about the same as when he was previously seen here in the wound care center. He most recently was a patient of [facility] when she was still working here in the center but had been referred to [facility] for consideration of a flap. With that being said the surgeon there at [facility] stated that this was too large for her flap and they have been attempting to get this smaller in order to be able to proceed with a flap.  Nonetheless unfortunately he has had a cycle of going back and forth between the osteomyelitis flaring and then sent him back and then making a little bit of progress only to be sent back again. It sounds like most recently they have been using a Iodoflex type dressing at this point which does not seem to have done any harm by any means. With that being said this wound seems to be quite large for using Iodoflex throughout and subsequently I think he may do much better  with the use of Vioxx moistened gauze which would be safe for the new tissue growing and also keep the wound quite nice and clean. The patient is not opposed to this he in fact states that his home health nurse had mentioned this as a possibility as well. 07/23/2019 upon evaluation today patient actually appears to be doing very well in regard to his sacral wound. In fact this is much healthier the measurements not terribly different but again with a wound like this at home necessarily expect a a lot of change as far as the overall measurements are concerned in just 1 week's time. This is going be a much longer term process at this point. With that being said I do think that he is very healthy appearing as far as the base of the wound is concerned. 08/06/2019 upon evaluation today patient actually appears to be doing about the same with regard to his wound to be honest. There is really not a significant improvement overall based on what I am seeing today. Fortunately there is no signs of significant systemic infection. No fevers, chills, nausea, vomiting, or diarrhea. With that being said there is odor to the drainage from the wound and subsequently also what appears to be increased drainage based on what we are seeing today as well as what his home health nurse called [place] about that she was concerned with as well. 08/13/2019 upon evaluation today patient appears to be doing about the same at this point with regard to his wound although I think  the dressing may be a little less drainage wise compared to what it was previous. Fortunately there is no signs of active infection at this time. No fevers, chills, nausea, vomiting, or diarrhea. He has been taking the antibiotic for only 3 days. 08/23/2019 upon evaluation today patient appears to be doing not nearly as well with regard to his wound. In general really not making a lot of progress which is unfortunate. There does not appear to be any signs of active infection at this time. No fevers, chills, nausea, vomiting, or diarrhea. With that being said the patient unfortunately does seem to be experiencing continued epiboly around the edges of the wound as well as significant scar tissue he has been in the majority of his day sitting in his chair which is likely a big reason for all of this. Fortunately there is no signs of active infection at this time. No fevers, chills, nausea, vomiting, or diarrhea. 08/29/2019 upon evaluation today patient's wound actually appears to be showing some signs of improvement. The region of few areas of new skin growth around the edges of the wound even where he has some of the epiboly. This is actually good news and I think that he has been very aggressive and offloading over the past week since we showed him the pictures of his wound last week. There still may be a chance that he is going require some sharp debridement to clear away some of these edges but to be honest I think that is can be quite an undertaking. The main reason is that he has a lot of thickened scar tissue and it is good to bleed quite significantly in my opinion. The I think if organ to do that we may even need to have a portable or disposable cautery in order to make sure that we are able to get the area completely sealed up as far as bleeding is concerned. I do have one that we  can utilize. However being that the wound looks so much better right now would like to give this 2 weeks to see how  things stand and look at that point before making any additional recommendations. 09/12/2019 upon evaluation today patient appears to be making some progress as far as offloading is concerned. He still has a substantial wound but nonetheless he tells me he is off loading much more aggressively at this point. This is obviously good news. No fevers, chills, nausea, vomiting, or diarrhea. 4/22; I have not seen this patient in quite some time. No major change in the condition of the wound or its wound volume. Surrounding maceration of the skin around the edges of the wound. The wound is fairly substantial. T close to the anal opening to consider a wound VAC. He had extensive trials of plastic o surgery at [facility] which really does not result in any improvement. His wound bed is however clean 10/10/2019 upon evaluation today patient appears to be doing well with regard to the wound in the sacral region. He has been tolerating the dressing changes without complication. Fortunately there is no signs of infection and he actually has some epithelial growth noted as well. He tells me has been trying to continue to offload this area which is excellent that is what he needs to do most as far as what he can have in his control. Otherwise I feel like the collagen with a saline moistened gauze behind has been beneficial for him. 5/20; collagen and normal saline wet-to-dry. Although the tissue when this large sacral wound looks reasonable there is been absolutely no benefit to the overall closure. He has macerated skin around this. He tells me that he spends a large part of the day up in the wheelchair. He still has home health 11/07/2019 upon evaluation today patient appears to be doing well with regard to his wounds at this point. Fortunately there is no signs of active infection at this time. Overall I feel like his sacral wound in general appears to be doing quite nicely. I am very pleased with overall how  things are progressing and I feel like the patient is making excellent progress towards resolution. With that being said he still has a long ways to go before we expect to see this completely healed. 11/21/2019 upon evaluation today patient appears to be doing about the same in regard to his sacral wound. Unfortunately he is really not making much in the way of improvement when I questioned him about how much time he is really spending sitting on his bottom he tells me that the majority of his day is mainly in that position though he does spend some time in bed. It does not sound like it is enough however neither does it look like it is enough. 12/24/2019 upon evaluation today patient appears to be doing really about the same there is no significant improvement overall in his wound bed. Last time he was seen here we did have a discussion about the possibility of seeing if I can get a surgeon to evaluate and potentially clear away some of the thickened edges of the wound in order to allow this wound to potentially have a chance of closing with a wound VAC. Right now we could not even get a wound VAC to seal with some of the rolled and thickened edges. With that being said elected to the patient to think about this to discuss today. At this point the patient does  want to see if I could make the referral to potentially see if the surgeon would be able to perform this and subsequently he agrees to go to a skilled nursing facility following if they are able to do this in order to allow for a wound VAC and to get this wound to heal more effectively and quickly. In my opinion the only way that I would recommend that he see the surgeon to clear away the edges would be if he is also in agreement with going to skilled nursing facility as I know he cannot appropriately offload at home to take care of this and that setting. 01/07/2020 on evaluation today patient appears to be doing about the same in regard to his  wound. There is no signs of active infection at this time which is great news. All in all he is really doing about the same. We have made referral to surgery to see if they can potentially perform debridement to clear away some of the scarred edging of the wound so we could potentially see about a wound VAC to try to help this heal more efficiently. Again we have discussed that if this occurs he would be in a skilled nursing facility following or release would need to be in order to appropriately offload. Nonetheless we are still in the process of working that referral. 01/21/2020 upon evaluation today patient appears to be doing about the same in regard to his sacral wound. He did see Dr. Lysle Pearl Dr. Ines Bloomer feeling was that there was not anything surgically he could offer at this point as he did not feel like that a wound VAC would likely be appropriate for the patient. With that being said he did recommend potential for trying to get the patient into a skilled nursing facility as an outpatient and they will get me working on that as well. SECUNDINO, ELLITHORPE ([id number]) [id number] Page 11 of 13 Fortunately there is no signs of active infection at this time. 11/10; this is a patient that has not been seen here in quite a bit of time while we were in our alternative venue. As well I have not seen this patient in probably over 2 years. As I remember things with him he has had this pressure ulcer encompassing his lower sacrum and coccyx and the surrounding buttocks for a prolonged period of time. When he first came here the surface looked fairly good and the wound was smaller we attempted a wound VAC but we could never keep this in place. Because of nonresponse of the wound to topical dressings we referred him to [facility] plastic surgery where preparation was being made for flap closure. Intact he had a colostomy however after that he was deemed to have 2 large wound for  flap closure. He did have osteomyelitis in the coccyx area in 2019. He is using wet-to-dry he has home health. They are managing his ostomy supplies as well. The wound is substantially larger than what I remember. Versus last time he was here to wound months or so ago he has an unstageable wound on the left upper thigh posteriorly. He tells me he is in his chair quite a bit especially recently 12/1; patient comes back to clinic. He did not get the lab work done apparently home health would not do this they referred him to the lab where he is apparently having a CBC checked because he is on iron infusions. We will try to get the lab work  I ordered done there. He also did not get the x-ray he says he does not remember the discussion. The issue is that this large wound has probing areas to bone. He may have underlying osteomyelitis again he was treated for this previously I think in 2019 12/15; the patient did have lab work done. Notable for iron deficiency anemia with a hemoglobin of 9.7 although he is following with hematology for this his CRP was 7.1 which is up from his previous value. sedimentation rate of 91 which is also higher. However he maintains a relatively high sedimentation rate. Most disturbingly was an albumin of 2.8 although this is also somewhat higher than the 2.2 we had previously. With some difficulty I think a home health nurse actually made him some Dakin's solution he is using a Dakin's wet-to-dry to the large sacral wound. He has the area on the left upper thigh as well 06/17/2020; I follow this patient on a monthly basis. 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 At one point he had underlying osteomyelitis here and he received antibiotic therapy. I believe this was done through [facility] We have been using palliative Dakin's solution wet-to-dry on this which is really This very large wound about the same with healthy surface. No active infection that is  obvious. He has developed an area on the left posterior thigh and now a mirror image area on the right posterior thigh noted pressure injuries from the wheelchair. This occurs even though he has a Roho cushion which is apparently new I have been forwarded lab work from 05/15/2020. At that point his hemoglobin was 9.2 down from 13.8 on 10/5 this is microcytic hypochromic. He has known iron deficiency although the exact cause of this is never really been clear to me. He follows with hematology oncology and has been receiving IV iron he is supposed to follow-up in mid January and I told the patient this. From wound care point of view his albumin is 2.9 sedimentation rate is 74. His sedimentation rate has always been elevated in this range however his C- reactive protein was 82 which is way above anything previously measured. I see that on 05/08/2020 this was 7.1 I am not sure why the rapid discrepancy. He is supposed to have an x-ray of his lower sacrum looking for evidence of osteomyelitis that he was previously treated for. Is possible he would also require more advanced imaging such as another C CT or MRI He comes in also with paperwork for a Clinitron air-fluidized bed with a trapeze bar. I asked him if he would be able to transfer in and out of this bed . I am not opposed to ordering it, but I do not want to make his transfers more difficult than they already are. He has a [employer] fast streamer 07/15/2020; he is managed to get his Clinitron air-fluidized bed. He has a new electric wheelchair with a wheelchair cushion. The x-ray I did showed some sacral bone destruction although this did not seem progressive him last time. He was treated for osteomyelitis in this area previously in 2018. His sedimentation rate is 91 CRP 7.1 albumin 2.8. We are using Dakin's wet-to-dry. He has [facility] home health 4/6; 66-monthfollow-up. He has a Clinitron air-fluidized bed. He has the wheelchair cushion. His major wound on the  coccyx looks somewhat better including including both of uKoreamy nurse and my feeling that it is closed in somewhat. Wound is still too close to  his anal opening to consider a wound VAC. At 1 point we were trying to get him into [facility] plastics to get a flap closure but for 1 reason or another this never moved forward I was never really certain what the issue was. He has an area on the left upper thigh in the gluteal fold. This is a clean wound. 6/1; 68-monthfollow-up. I follow this man on a palliative basis he has a chronic stage IV wound over the lower sacrum coccyx and surrounding buttocks. He has an air-fluidized bed and a wheelchair cushion but he spends more time in the wheelchair than he does off this area by his own admission. I do not think the area is really changed that much although it is probably deeper in 1 section. He does tell uKoreathat he was discharged from home health because of lack of change of the wound. This [place] He was treated for underlying osteomyelitis several years ago and he may have underlying chronic osteomyelitis. I do not believe he ever got the x-ray I ordered 8/3; patient presents for his 264-monthollow-up. He reports no issues or complaints today. He continues to use Dakin's wet-to-dry dressings to his sacrum and silver alginate to his left lateral thigh wound. He denies signs of infection. 10/5; patient presents for 2-55-monthllow-up. He has no issues or complaints today. He continues to use Dakin's wet-to-dry dressings to his sacrum. He states that the left lateral thigh wound is healed. He denies signs of infection. 12/7; patient presents for 2-m92-monthlow-up. He states he would like to obtain a new air loss mattress bed. He states his current one is having technical difficulties. He currently is using Dakin's wet-to-dry dressings. He denies signs of infection. 2/1; patient presents for follow-up. He has no issues or complaints today. He currently uses  Dakin's wet-to-dry dressings. He denies signs of infection. He has his new air loss mattress. 4/5; patient presents for follow-up. He has been using Dakin's wet-to-dry dressings. He has no issues or complaints today. He denies signs of infection. 11-10-2021 upon evaluation today patient's wound is doing better since I last saw him although it has been quite sometime since then to be perfectly honest. In fact I think it may have been even back in 2022 if I am not mistaken. Either way he has been seeing Dr. HoffHeber Carolinaghly every 2 months per his request. His wound still though smaller does have a significant amount of thick scar tissue surrounding the edge which is making it very difficult to heal to be honest. His son is performing the dressing changes at home currently. 8/23; patient presents for follow-up. He has no issues or complaints today. He has been doing Dakin's wet-to-dry dressings. He is not interested in any further surgical intervention for his sacral wound. He denies signs of infection. 11/15; Patient presents for follow up. He has been using Dakins wet to dry dressings. He has no issues or complaints. 2/7; patient presents for his regular 2-mo93-monthow-up. He continues to use Dakin's wet-to-dry dressings. He has no issues or complaints today. GIBSOGILLIE, [id number] Page 12 of 13 Objective Constitutional Vitals Time Taken: 11:15 AM, Temperature: 98.1 F, Pulse: 79 bpm, Respiratory Rate: 18 breaths/min, Blood Pressure: 129/86 mmHg. General Notes: Sacral ulcer: Granulation tissue throughout. Not all areas of the wound bed appears healthy. No probing to bone. No obvious signs of infection. Integumentary (Hair, Skin) Wound #10 status is Open. Original cause of wound was Pressure Injury.  The date acquired was: 06/07/2015. The wound has been in treatment 156 weeks. The wound is located on the Midline Sacrum. The wound measures 7cm length x 3cm width  x 3cm depth; 16.493cm^2 area and 49.48cm^3 volume. There is muscle and Fat Layer (Subcutaneous Tissue) exposed. There is no tunneling or undermining noted. There is a medium amount of serosanguineous drainage noted. The wound margin is epibole. There is large (67-100%) red, pink granulation within the wound bed. There is a small (1-33%) amount of necrotic tissue within the wound bed including Adherent Slough. 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 Is a palliative care case. Expectation is to help with wound care supplies and have close follow-up with patient as needed for issues. So far he has done well over the past 2 months with Dakin's wet-to-dry dressings. Continue. Plan Follow-up Appointments: Other: - 2 months palliative care Bathing/ Shower/ Hygiene: May shower; gently cleanse wound with antibacterial soap, rinse and pat dry prior to dressing wounds Off-Loading: Roho cushion for wheelchair Low air-loss mattress (Group 2) Turn and reposition every 2 hours WOUND #10: - Sacrum Wound Laterality: Midline Cleanser: Dakin 16 (oz) 0.25 1 x Per Day/30 Days Discharge Instructions: Use as directed. Prim Dressing: Gauze (Generic) 1 x Per Day/30 Days ary Discharge Instructions: As directed: moistened with Dakins Solution Secondary Dressing: ABD Pad 5x9 (in/in) (Dispense As Written) 1 x Per Day/30 Days Discharge Instructions: Cover with ABD pad Secured With: Medipore T - 56M Medipore H Soft Cloth Surgical T ape ape, 2x2 (in/yd) (Dispense As Written) 1 x Per Day/30 Days 1. Dakin's wet-to-dry dressings 2. Aggressive offloading 3. Follow-up in 2 months Electronic Signature(s) Signed: 07/13/2022 12:44:59 PM By: Kalman Shan DO Entered By: Kalman Shan on 07/13/2022 12:43:59 [name]  ([id number]) [id number] Page 13 of 13 -------------------------------------------------------------------------------- SuperBill Details Patient Name: Date of Service: Bradley [name] MES A. 07/13/2022 Medical Record Number: [id number] Patient Account Number: [id number] Date of Birth/Sex: Treating RN: 1945-07-02 (77 y.o. 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 Facility Procedures : CPT4 Code: 82641583 Description: 09407 - WOUND CARE VISIT-LEV 2 EST PT Modifier: Quantity: 1 Physician Procedures : CPT4 Code Description Modifier [id number] 03159 - WC PHYS LEVEL 3 - EST PT ICD-10 Diagnosis Description L89.154 Pressure ulcer of sacral region, stage 4 M86.68 Other chronic osteomyelitis, other site G82.20 Paraplegia, unspecified L97.128 Non-pressure  chronic ulcer of left thigh with other specified severity Quantity: 1 Electronic Signature(s) Signed: 07/13/2022 12:44:59 PM By: Kalman Shan DO Previous Signature: 07/13/2022 12:01:51 PM Version By: Carlene Coria RN Previous Signature: 07/13/2022 12:08:51 PM Version By: Kalman Shan DO Entered By: Kalman Shan on 07/13/2022 12:44:15

## 2022-07-26 DIAGNOSIS — K631 Perforation of intestine (nontraumatic): Secondary | ICD-10-CM | POA: Diagnosis not present

## 2022-07-26 DIAGNOSIS — N319 Neuromuscular dysfunction of bladder, unspecified: Secondary | ICD-10-CM | POA: Diagnosis not present

## 2022-07-26 DIAGNOSIS — Z933 Colostomy status: Secondary | ICD-10-CM | POA: Diagnosis not present

## 2022-07-26 DIAGNOSIS — L89159 Pressure ulcer of sacral region, unspecified stage: Secondary | ICD-10-CM | POA: Diagnosis not present

## 2022-07-26 DIAGNOSIS — S31000A Unspecified open wound of lower back and pelvis without penetration into retroperitoneum, initial encounter: Secondary | ICD-10-CM | POA: Diagnosis not present

## 2022-07-26 DIAGNOSIS — S81809A Unspecified open wound, unspecified lower leg, initial encounter: Secondary | ICD-10-CM | POA: Diagnosis not present

## 2022-07-27 DIAGNOSIS — S31000A Unspecified open wound of lower back and pelvis without penetration into retroperitoneum, initial encounter: Secondary | ICD-10-CM | POA: Diagnosis not present

## 2022-07-27 DIAGNOSIS — S81809A Unspecified open wound, unspecified lower leg, initial encounter: Secondary | ICD-10-CM | POA: Diagnosis not present

## 2022-07-27 DIAGNOSIS — L89159 Pressure ulcer of sacral region, unspecified stage: Secondary | ICD-10-CM | POA: Diagnosis not present

## 2022-07-27 DIAGNOSIS — Z933 Colostomy status: Secondary | ICD-10-CM | POA: Diagnosis not present

## 2022-07-27 DIAGNOSIS — N319 Neuromuscular dysfunction of bladder, unspecified: Secondary | ICD-10-CM | POA: Diagnosis not present

## 2022-07-27 DIAGNOSIS — K631 Perforation of intestine (nontraumatic): Secondary | ICD-10-CM | POA: Diagnosis not present

## 2022-07-28 ENCOUNTER — Inpatient Hospital Stay: Payer: Medicare HMO | Attending: Oncology

## 2022-07-28 DIAGNOSIS — D509 Iron deficiency anemia, unspecified: Secondary | ICD-10-CM | POA: Diagnosis not present

## 2022-07-28 DIAGNOSIS — Z87891 Personal history of nicotine dependence: Secondary | ICD-10-CM | POA: Insufficient documentation

## 2022-07-28 DIAGNOSIS — G8220 Paraplegia, unspecified: Secondary | ICD-10-CM | POA: Diagnosis not present

## 2022-07-28 DIAGNOSIS — Z933 Colostomy status: Secondary | ICD-10-CM | POA: Insufficient documentation

## 2022-07-28 DIAGNOSIS — D508 Other iron deficiency anemias: Secondary | ICD-10-CM

## 2022-07-28 LAB — CBC WITH DIFFERENTIAL/PLATELET
Abs Immature Granulocytes: 0.03 10*3/uL (ref 0.00–0.07)
Basophils Absolute: 0 10*3/uL (ref 0.0–0.1)
Basophils Relative: 0 %
Eosinophils Absolute: 0.2 10*3/uL (ref 0.0–0.5)
Eosinophils Relative: 2 %
HCT: 41.5 % (ref 39.0–52.0)
Hemoglobin: 12.8 g/dL — ABNORMAL LOW (ref 13.0–17.0)
Immature Granulocytes: 0 %
Lymphocytes Relative: 24 %
Lymphs Abs: 2.2 10*3/uL (ref 0.7–4.0)
MCH: 24.2 pg — ABNORMAL LOW (ref 26.0–34.0)
MCHC: 30.8 g/dL (ref 30.0–36.0)
MCV: 78.6 fL — ABNORMAL LOW (ref 80.0–100.0)
Monocytes Absolute: 0.7 10*3/uL (ref 0.1–1.0)
Monocytes Relative: 8 %
Neutro Abs: 6.1 10*3/uL (ref 1.7–7.7)
Neutrophils Relative %: 66 %
Platelets: 304 10*3/uL (ref 150–400)
RBC: 5.28 MIL/uL (ref 4.22–5.81)
RDW: 15.3 % (ref 11.5–15.5)
WBC: 9.4 10*3/uL (ref 4.0–10.5)
nRBC: 0 % (ref 0.0–0.2)

## 2022-07-28 LAB — IRON AND TIBC
Iron: 47 ug/dL (ref 45–182)
Saturation Ratios: 17 % — ABNORMAL LOW (ref 17.9–39.5)
TIBC: 286 ug/dL (ref 250–450)
UIBC: 239 ug/dL

## 2022-07-28 LAB — FERRITIN: Ferritin: 273 ng/mL (ref 24–336)

## 2022-07-29 MED FILL — Iron Sucrose Inj 20 MG/ML (Fe Equiv): INTRAVENOUS | Qty: 10 | Status: AC

## 2022-08-01 ENCOUNTER — Inpatient Hospital Stay: Payer: Medicare HMO

## 2022-08-01 ENCOUNTER — Inpatient Hospital Stay (HOSPITAL_BASED_OUTPATIENT_CLINIC_OR_DEPARTMENT_OTHER): Payer: Medicare HMO | Admitting: Oncology

## 2022-08-01 ENCOUNTER — Encounter: Payer: Self-pay | Admitting: Oncology

## 2022-08-01 VITALS — BP 124/45 | HR 78 | Temp 96.5°F | Resp 18

## 2022-08-01 DIAGNOSIS — D508 Other iron deficiency anemias: Secondary | ICD-10-CM

## 2022-08-01 DIAGNOSIS — Z87891 Personal history of nicotine dependence: Secondary | ICD-10-CM | POA: Diagnosis not present

## 2022-08-01 DIAGNOSIS — Z933 Colostomy status: Secondary | ICD-10-CM | POA: Diagnosis not present

## 2022-08-01 DIAGNOSIS — D509 Iron deficiency anemia, unspecified: Secondary | ICD-10-CM | POA: Diagnosis not present

## 2022-08-01 DIAGNOSIS — G8220 Paraplegia, unspecified: Secondary | ICD-10-CM | POA: Diagnosis not present

## 2022-08-01 MED ORDER — FERROUS SULFATE 325 (65 FE) MG PO TBEC: 325.0000 mg | DELAYED_RELEASE_TABLET | Freq: Two times a day (BID) | ORAL | 2 refills | Status: DC

## 2022-08-01 NOTE — Progress Notes (Signed)
Hematology/Oncology Progress note [phone] [phone]   Patient Care Team: [name], MD as PCP - General (Family Medicine) Bradley Server, MD as Consulting Physician (Oncology)   ASSESSMENT & PLAN:   Iron deficiency anemia Labs are reviewed and discussed with patient Stable hemoglobin, decreased iron saturation.  Discussed about option of increase oral iron supplementation dosage vs IV venofer.  He elects to increase ferrous sulfate to twice daily.  We have discussed about GI work up and patient is concerned that he may not tolerate procedure.    Orders Placed This Encounter  Procedures   CBC with Differential ([place] Only)    Standing Status:   Future    Standing Expiration Date:   08/02/2023   Iron and TIBC    Standing Status:   Future    Standing Expiration Date:   08/02/2023   Ferritin    Standing Status:   Future    Standing Expiration Date:   08/02/2023   Follow up  Lab in 6 months, prior to MD + Venofer.  All questions were answered. The patient knows to call the clinic with any problems, questions or concerns.  Bradley Server, MD, PhD [name] Hematology Oncology 08/01/2022   CHIEF COMPLAINTS/PURPOSE OF CONSULTATION:  Anemia  HISTORY OF PRESENTING ILLNESS:  Bradley [name] 77 y.o.  male with PMH listed as below who was referred to me for evaluation of iron deficiency anemia.  Patient is paraplegic, also has chronic pressure ulcer, recently just had colostomy. He self catheterizes. Patient feels that his energy levels have been the same. He always has mild fatigue.  He has BPH for which he take finasteride. He has not see his urologist for a while.  He was seen by [name] for elevated ESR and his chronic hand joint tenderness. He was not considered to have rheumatoid arthritis.   INTERVAL HISTORY Patient presents to follow-up for anemia management.   Denies hematochezia, hematuria, hematemesis, epistaxis, black tarry stool or easy  bruising.  He take oral ferrous sulfate '325mg'$  daily.   He feels well.   ROS:  Review of Systems  Constitutional:  Negative for fatigue.  HENT:  Negative.  Negative for lump/mass and mouth sores.   Eyes: Negative.  Negative for eye problems.  Respiratory: Negative.  Negative for chest tightness and cough.   Cardiovascular: Negative.  Negative for chest pain.  Gastrointestinal: Negative.  Negative for abdominal distention and abdominal pain.       Colostomy  Endocrine: Negative.  Negative for hot flashes.  Genitourinary:  Negative for hematuria.        Self catherize  Musculoskeletal:  Positive for arthralgias. Negative for back pain.       Paraplegic  Skin:  Positive for wound. Negative for itching and rash.       Chronic pressure ulcer  Neurological: Negative.  Negative for dizziness.  Hematological: Negative.  Negative for adenopathy.  Psychiatric/Behavioral: Negative.  The patient is not nervous/anxious.     MEDICAL HISTORY:  Past Medical History:  Diagnosis Date   Anemia    Arthritis    Paralysis of both lower limbs ([place])    Sacral decubitus ulcer     SURGICAL HISTORY: Past Surgical History:  Procedure Laterality Date   COLONOSCOPY  06/06/2014   INCISION AND DRAINAGE OF WOUND N/A 06/04/2018   Procedure: IRRIGATION AND DEBRIDEMENT WOUND;  Surgeon: Bradley Ree, MD;  Location: [facility] ORS;  Service: General;  Laterality: N/A;   TONSILLECTOMY  SOCIAL HISTORY: Social History   Socioeconomic History   Marital status: Widowed    Spouse name: Not on file   Number of children: Not on file   Years of education: Not on file   Highest education level: Not on file  Occupational History   Not on file  Tobacco Use   Smoking status: Former    Packs/day: 0.50    Years: 5.00    Total pack years: 2.50    Types: Cigarettes    Quit date: 71    Years since quitting: 52.1   Smokeless tobacco: Never  Vaping Use   Vaping Use: Never used  Substance and Sexual Activity    Alcohol use: No   Drug use: No   Sexual activity: Not on file  Other Topics Concern   Not on file  Social History Narrative   Not on file   Social Determinants of Health   Financial Resource Strain: Not on file  Food Insecurity: Not on file  Transportation Needs: Not on file  Physical Activity: Not on file  Stress: Not on file  Social Connections: Not on file  Intimate Partner Violence: Not on file    FAMILY HISTORY: Family History  Problem Relation Age of Onset   Breast cancer Mother     ALLERGIES:  has No Known Allergies.  MEDICATIONS:  Current Outpatient Medications  Medication Sig Dispense Refill   acetaminophen (TYLENOL) 500 MG tablet Take 1,000 mg by mouth 3 (three) times daily.      cetirizine (ZYRTEC) 10 MG tablet Take by mouth.     chlorthalidone (HYGROTON) 25 MG tablet Take 1 tablet by mouth daily.     fluticasone (FLONASE) 50 MCG/ACT nasal spray Place into the nose.     Multiple Vitamin (MULTI-VITAMINS) TABS Take 1 tablet by mouth daily.      polyethylene glycol powder (GLYCOLAX/MIRALAX) powder Take 17 g by mouth daily as needed for mild constipation.      vitamin C (ASCORBIC ACID) 500 MG tablet Take 500 mg by mouth 2 (two) times daily.     amLODipine (NORVASC) 5 MG tablet Take 5 mg by mouth daily. (Patient not taking: Reported on 08/01/2022)     docusate sodium (COLACE) 100 MG capsule Take 1 capsule (100 mg total) by mouth daily. (Patient not taking: Reported on 03/31/2022) 90 capsule 1   ferrous sulfate 325 (65 FE) MG EC tablet Take 1 tablet (325 mg total) by mouth in the morning and at bedtime. 60 tablet 2   finasteride (PROSCAR) 5 MG tablet Take 5 mg by mouth daily. (Patient not taking: Reported on 03/31/2022)     No current facility-administered medications for this visit.      Bradley [name]  PHYSICAL EXAMINATION: ECOG PERFORMANCE STATUS: 2 - Symptomatic, <50% confined to bed Vitals:   08/01/22 1309  BP: (!) 124/45  Pulse: 78  Resp: 18  Temp: (!) 96.5 F  (35.8 C)  SpO2: 100%   There were no vitals filed for this visit.  Physical Exam Constitutional:      General: He is not in acute distress.    Appearance: He is not diaphoretic.  HENT:     Head: Normocephalic and atraumatic.     Nose: Nose normal.     Mouth/Throat:     Pharynx: No oropharyngeal exudate.  Eyes:     General: No scleral icterus.    Pupils: Pupils are equal, round, and reactive to light.  Cardiovascular:     Rate and Rhythm:  Normal rate and regular rhythm.     Heart sounds: No murmur heard. Pulmonary:     Effort: Pulmonary effort is normal. No respiratory distress.     Breath sounds: No rales.  Chest:     Chest wall: No tenderness.  Abdominal:     General: There is no distension.     Palpations: Abdomen is soft.     Tenderness: There is no abdominal tenderness.  Musculoskeletal:     Cervical back: Normal range of motion and neck supple.     Comments: Bilateral ulnar deviation of metacarpophalangeal joints and joint tenderness.    Skin:    General: Skin is warm and dry.     Findings: No erythema.  Neurological:     Mental Status: He is alert and oriented to person, place, and time.     Cranial Nerves: No cranial nerve deficit.     Motor: No abnormal muscle tone.     Coordination: Coordination normal.     Comments:  paraplegic  Psychiatric:        Mood and Affect: Affect normal.       LABORATORY DATA:  I have reviewed the data as listed     Latest Ref Rng & Units 07/28/2022   12:51 PM 03/28/2022   10:41 AM 11/17/2021   10:48 AM  CBC  WBC 4.0 - 10.5 K/uL 9.4  7.6  7.2   Hemoglobin 13.0 - 17.0 g/dL 12.8  12.6  12.6   Hematocrit 39.0 - 52.0 % 41.5  41.0  40.7   Platelets 150 - 400 K/uL 304  287  271       Latest Ref Rng & Units 10/22/2021   11:08 AM 05/08/2020   11:03 AM 04/01/2020    1:35 PM  CMP  Glucose 65 - 99 mg/dL 75  81  103   BUN 7 - 25 mg/dL '12  10  6   '$ Creatinine 0.70 - 1.28 mg/dL 0.47  0.46  0.45   Sodium 135 - 146 mmol/L 143   137  139   Potassium 3.5 - 5.3 mmol/L 3.7  4.0  3.3   Chloride 98 - 110 mmol/L 105  101  100   CO2 20 - 32 mmol/L '26  31  29   '$ Calcium 8.6 - 10.3 mg/dL 9.1  8.6  7.9   Total Protein 6.1 - 8.1 g/dL 7.4  8.3  7.2   Total Bilirubin 0.2 - 1.2 mg/dL 0.4  0.5  0.6   Alkaline Phos 38 - 126 U/L  60  48   AST 10 - 35 U/L '12  15  14   '$ ALT 9 - 46 U/L '8  9  7     '$ Iron/TIBC/Ferritin/ %Sat    Component Value Date/Time   IRON 47 07/28/2022 1251   TIBC 286 07/28/2022 1251   FERRITIN 273 07/28/2022 1251   IRONPCTSAT 17 (L) 07/28/2022 1251

## 2022-08-01 NOTE — Assessment & Plan Note (Addendum)
Labs are reviewed and discussed with patient Stable hemoglobin, decreased iron saturation.  Discussed about option of increase oral iron supplementation dosage vs IV venofer.  He elects to increase ferrous sulfate to twice daily.  We have discussed about GI work up and patient is concerned that he may not tolerate procedure.

## 2022-09-07 ENCOUNTER — Ambulatory Visit: Payer: Medicare HMO | Admitting: Internal Medicine

## 2022-09-08 DIAGNOSIS — L89154 Pressure ulcer of sacral region, stage 4: Secondary | ICD-10-CM | POA: Diagnosis not present

## 2022-09-08 DIAGNOSIS — Z993 Dependence on wheelchair: Secondary | ICD-10-CM | POA: Diagnosis not present

## 2022-09-08 DIAGNOSIS — G8220 Paraplegia, unspecified: Secondary | ICD-10-CM | POA: Diagnosis not present

## 2022-09-08 DIAGNOSIS — R252 Cramp and spasm: Secondary | ICD-10-CM | POA: Diagnosis not present

## 2022-09-14 ENCOUNTER — Encounter: Payer: Medicare HMO | Attending: Internal Medicine | Admitting: Internal Medicine

## 2022-09-14 DIAGNOSIS — M8668 Other chronic osteomyelitis, other site: Secondary | ICD-10-CM | POA: Insufficient documentation

## 2022-09-14 DIAGNOSIS — L89154 Pressure ulcer of sacral region, stage 4: Secondary | ICD-10-CM | POA: Insufficient documentation

## 2022-09-14 DIAGNOSIS — G8220 Paraplegia, unspecified: Secondary | ICD-10-CM | POA: Diagnosis not present

## 2022-09-14 DIAGNOSIS — L97128 Non-pressure chronic ulcer of left thigh with other specified severity: Secondary | ICD-10-CM | POA: Diagnosis not present

## 2022-09-15 ENCOUNTER — Encounter: Payer: Self-pay | Admitting: Oncology

## 2022-09-15 NOTE — Progress Notes (Signed)
Bradley [name], Bradley [name] ([id number]) [id number] Page 1 of 7 Visit Report for 09/14/2022 Arrival Information Details Patient Name: Date of Service: Bradley [name], Bradley [name]. 09/14/2022 1:00 PM Medical Record Number: [id number] Patient Account Number: [id number] Date of Birth/Sex: Treating Bradley [name]: Apr 04, 1946 (77 y.o. Bradley [name]) Bradley [name], Bradley [name] Primary Care Bradley [name]: Bradley [name], Bradley Other Clinician: Betha [name], Bradley [name] Referring Bradley [name]: Treating Bradley [name]: Bradley [name], Bradley [name] [name], Bradley [name]: 165 Visit Information History Since Last Visit All ordered tests and consults were completed: No Patient Arrived: Wheel Chair Added or deleted any medications: No Arrival Time: 12:56 Any new allergies or adverse reactions: No Transfer Assistance: Michiel SitesHoyer Lift Had [name] fall or experienced change in No Patient Identification Verified: Yes activities of daily living that may affect Secondary Verification Process Completed: Yes risk of falls: Patient Requires Transmission-Based Precautions: No Signs or symptoms of abuse/neglect since last visito No Patient Has Alerts: No Hospitalized since last visit: No Implantable device outside of the clinic excluding No cellular tissue based products placed in the center since last visit: Has Dressing in Place as Prescribed: Yes Pain Present Now: Yes Electronic Signature(s) Signed: 09/14/2022 4:41:17 PM By: Bradley [name], Bradley [name] Entered By: Bradley [name], Bradley [name] on 09/14/2022 13:07:31 -------------------------------------------------------------------------------- Clinic Level of Care Assessment Details Patient Name: Date of Service: Bradley [name], Bradley [name]. 09/14/2022 1:00 PM Medical Record Number: [id number] Patient Account Number: [id number] Date of Birth/Sex: Treating Bradley [name]: Apr 04, 1946 (77 y.o. Loel LoftyM) Bradley [name], Bradley [name] Primary Care Bradley [name]: Bradley [name], Bradley Other Clinician: Betha [name], Bradley [name] Referring Bradley [name]: Treating Bradley [name]: Bradley [name], Bradley [name] [name],  Bradley [name]: 165 Clinic Level of Care Assessment Items TOOL 4 Quantity Score []  - 0 Use when only an EandM is performed on FOLLOW-UP visit ASSESSMENTS - Nursing Assessment / Reassessment X- 1 10 Reassessment of Co-morbidities (includes updates in patient status) X- 1 5 Reassessment of Adherence to [name] Plan ASSESSMENTS - Wound and Skin [name] ssessment / Reassessment X - Simple Wound Assessment / Reassessment - one wound 1 5 []  - 0 Complex Wound Assessment / Reassessment - multiple wounds []  - 0 Dermatologic / Skin Assessment (not related to wound area) ASSESSMENTS - Focused Assessment []  - 0 Circumferential Edema Measurements - multi extremities []  - 0 Nutritional Assessment / Counseling / Intervention []  - 0 Lower Extremity Assessment (monofilament, tuning fork, pulses) []  - 0 Peripheral Arterial Disease Assessment (using hand held doppler) ASSESSMENTS - Ostomy and/or Continence Assessment and Care []  - 0 Incontinence Assessment and Management []  - 0 Ostomy Care Assessment and Management (repouching, etc.) PROCESS - Coordination of Care Judd GaudierGIBSON, Bradley [name] ([id number]) [id number] Page 2 of 7 X- 1 15 Simple Patient / Family Education for ongoing care []  - 0 Complex (extensive) Patient / Family Education for ongoing care []  - 0 Staff obtains ChiropractorConsents, Records, T Results / Process Orders est []  - 0 Staff telephones HHA, Nursing Homes / Clarify orders / etc []  - 0 Routine Transfer to another Facility (non-emergent condition) []  - 0 Routine Hospital Admission (non-emergent condition) []  - 0 New Admissions / Manufacturing engineernsurance Authorizations / Ordering NPWT Apligraf, etc. , []  - 0 Emergency Hospital Admission (emergent condition) X- 1 10 Simple Discharge Coordination []  - 0 Complex (extensive) Discharge Coordination PROCESS - Special Needs []  - 0 Pediatric / Minor Patient Management []  - 0 Isolation Patient Management []  - 0 Hearing  / Language / Visual special needs []  - 0 Assessment of Community assistance (transportation, D/C planning, etc.) []  - 0 Additional assistance / Altered mentation []  - 0 Support Surface(s) Assessment (bed, cushion, seat, etc.) INTERVENTIONS -  Wound Cleansing / Measurement X - Simple Wound Cleansing - one wound 1 5 []  - 0 Complex Wound Cleansing - multiple wounds X- 1 5 Wound Imaging (photographs - any number of wounds) []  - 0 Wound Tracing (instead of photographs) X- 1 5 Simple Wound Measurement - one wound []  - 0 Complex Wound Measurement - multiple wounds INTERVENTIONS - Wound Dressings []  - 0 Small Wound Dressing one or multiple wounds X- 1 15 Medium Wound Dressing one or multiple wounds []  - 0 Large Wound Dressing one or multiple wounds []  - 0 Application of Medications - topical []  - 0 Application of Medications - injection INTERVENTIONS - Miscellaneous []  - 0 External ear exam []  - 0 Specimen Collection (cultures, biopsies, blood, body fluids, etc.) []  - 0 Specimen(s) / Culture(s) sent or taken to Lab for analysis X- 1 10 Patient Transfer (multiple staff / Nurse, adult / Similar devices) []  - 0 Simple Staple / Suture removal (25 or less) []  - 0 Complex Staple / Suture removal (26 or more) []  - 0 Hypo / Hyperglycemic Management (close monitor of Blood Glucose) []  - 0 Ankle / Brachial Index (ABI) - do not check if billed separately X- 1 5 Vital Signs Has the patient been seen at the hospital within the last three years: Yes Total Score: 90 Level Of Care: New/Established - Level 3 Electronic Signature(s) Signed: 09/14/2022 4:41:17 PM By: Bradley [name] ([id number]) [id number] Page 3 of 7 Entered By: Bradley Loa on 09/14/2022 13:31:20 -------------------------------------------------------------------------------- Encounter Discharge Information Details Patient Name: Date of Service: Bradley Pink [name] 09/14/2022  1:00 PM Medical Record Number: [id number] Patient Account Number: [id number] Date of Birth/Sex: Treating Bradley [name]: July 24, 1945 (77 y.o. Bradley [name] Primary Care Mallorey Odonell: Bradley [name] Other Clinician: Betha Loa Referring Jannet Calip: Treating Timur Nibert/Extender: Bradley Doom in [name]: [phone] Encounter Discharge Information Items Discharge Condition: Stable Ambulatory Status: Wheelchair Discharge Destination: Home Transportation: Other Accompanied By: self Schedule Follow-up Appointment: Yes Clinical Summary of Care: Electronic Signature(s) Signed: 09/14/2022 4:41:17 PM By: Bradley Loa Entered By: Bradley Loa on 09/14/2022 13:49:09 -------------------------------------------------------------------------------- Lower Extremity Assessment Details Patient Name: Date of Service: Bradley Pink [name] 09/14/2022 1:00 PM Medical Record Number: [id number] Patient Account Number: [id number] Date of Birth/Sex: Treating Bradley [name]: 07-01-1945 (77 y.o. Bradley [name] Primary Care Simone Tuckey: Bradley [name] Other Clinician: Betha Loa Referring Yumiko Alkins: Treating Ojani Berenson/Extender: Bradley Doom in [name]: 165 Electronic Signature(s) Signed: 09/14/2022 4:41:17 PM By: Bradley Loa Signed: 09/14/2022 5:50:22 PM By: Bradley [name] BSN, Bradley [name], Bradley [name], Bradley [name], Bradley [name] Entered By: Bradley Loa on 09/14/2022 13:23:44 -------------------------------------------------------------------------------- Multi Wound Chart Details Patient Name: Date of Service: Bradley Pink [name] 09/14/2022 1:00 PM Medical Record Number: [id number] Patient Account Number: [id number] Date of Birth/Sex: Treating Bradley [name]: May 28, 1946 (77 y.o. Bradley [name] Primary Care Fauna Neuner: Bradley [name] Other Clinician: Betha Loa Referring Bralynn Velador: Treating Chelsey Kimberley/Extender: Bradley Doom in [name]: 165 Vital Signs Height(in): Pulse(bpm):  71 Weight(lbs): Blood Pressure(mmHg): 121/76 Body Mass Index(BMI): Temperature(F): 98.2 Respiratory Rate(breaths/min): 16 [10:Photos: No Photos Midline Sacrum Wound Location: Pressure Injury Wounding Event: Pressure Ulcer Primary Etiology: Anemia, Hypertension, History of Comorbid History: pressure wounds, Rheumatoid Arthritis, Paraplegia 06/07/2015 Date Acquired:] [[name]/[name]:[name]/[name] [name]/[name] [name]/[name] [name]/[name]  [name]/[name] [name]/[name]] CAVAN, CICCARELLO ([id number]) [10:165 Weeks of [name]: Open Wound Status: No Wound Recurrence: 8.5x2.7x1.2 Measurements L x W x D (cm) 18.025 [name] (cm) : rea 21.63 Volume (cm) : 65.00% % Reduction in [name] rea: 93.00% % Reduction in Volume:  Category/Stage IV Classification: Medium  Exudate [name] mount: Serosanguineous Exudate Type: red, brown Exudate Color: Epibole Wound Margin: Large (67-100%) Granulation [name] mount: Red, Pink Granulation Quality: Small (1-33%) Necrotic [name] mount: Fat Layer (Subcutaneous Tissue): Yes [name]/[name] Exposed  Structures: Muscle: Yes Fascia: No Tendon: No Joint: No Bone: No None Epithelialization:] [[name]/[name]:[name]/[name] [name]/[name] [name]/[name] [name]/[name] [name]/[name] [name]/[name] [name]/[name] [name]/[name] [name]/[name] [name]/[name] [name]/[name] [name]/[name] [name]/[name] [name]/[name] [name]/[name] [name]/[name] [name]/[name]] [name] Notes Electronic Signature(s) Signed: 09/14/2022 4:41:17 PM By: Bradley Loa Entered By: Bradley Loa on 09/14/2022 13:23:47 -------------------------------------------------------------------------------- Multi-Disciplinary Care Plan Details Patient Name: Date of Service: Bradley Pink [name] 09/14/2022 1:00 PM Medical Record Number: [id number] Patient Account Number: [id number] Date of Birth/Sex: Treating Bradley [name]: 11-10-1945 (77 y.o. Bradley [name] Primary Care Tyronn Golda: Bradley [name] Other Clinician: Betha Loa Referring Ariann Khaimov: Treating Suheyb Raucci/Extender: Bradley Doom in [name]: [phone] Active Inactive Electronic Signature(s) Signed: 09/14/2022 4:41:17 PM By: Bradley Loa Signed: 09/14/2022 5:50:22 PM By: Bradley [name] BSN, Bradley [name], Bradley [name], Bradley [name], Bradley [name] Entered By: Bradley Loa on  09/14/2022 13:31:31 -------------------------------------------------------------------------------- Pain Assessment Details Patient Name: Date of Service: Bradley Pink [name] 09/14/2022 1:00 PM Medical Record Number: [id number] Patient Account Number: [id number] Date of Birth/Sex: Treating Bradley [name]: [birth year] (77 y.o. Bradley [name] Primary Care Saulo Anthis: Bradley [name] Other Clinician: Betha Loa Referring Ashur Glatfelter: Treating Jasdeep Dejarnett/Extender: Bradley Doom in [name]: [phone] Active Problems Location of Pain Severity and Description of Pain Patient Has Paino Yes Site Locations Pain Location: RAHAT, VOORHEES ([id number]) [phone].pdf Page 5 of 7 Pain Location: Pain in Ulcers Duration of the Pain. Constant / Intermittento Constant Rate the pain. Current Pain Level: 7 Character of Pain Describe the Pain: Aching, Throbbing Pain Management and Medication Current Pain Management: Medication: Yes Cold Application: No Rest: No Massage: No Activity: No T.E.[name].S.: No Heat Application: No Leg drop or elevation: No Is the Current Pain Management Adequate: Inadequate How does your wound impact your activities of daily livingo Sleep: No Bathing: No Appetite: No Relationship With Others: No Bladder Continence: No Emotions: No Bowel Continence: No Work: No Toileting: No Drive: No Dressing: No Hobbies: No Electronic Signature(s) Signed: 09/14/2022 4:41:17 PM By: Bradley Loa Signed: 09/14/2022 5:50:22 PM By: Bradley [name], BSN, Bradley [name], Bradley [name], Bradley [name], Bradley [name] Entered By: Bradley Loa on 09/14/2022 13:10:00 -------------------------------------------------------------------------------- Patient/Caregiver Education Details Patient Name: Date of Service: Bradley Pink [name] 4/10/2024andnbsp1:00 PM Medical Record Number: [id number] Patient Account Number: [id number] Date of Birth/Gender: Treating Bradley [name]: [birth year] (77 y.o. Bradley [name] Primary  Care Physician: Bradley [name] Other Clinician: Betha Loa Referring Physician: Treating Physician/Extender: Bradley Doom in [name]: [phone] Education Assessment Education Provided To: Patient Education Topics Provided Wound/Skin Impairment: Handouts: Other: continue wound care as directed Methods: Explain/Verbal Responses: State content correctly Electronic Signature(s) Signed: 09/14/2022 4:41:17 PM By: Bradley Loa Entered By: Bradley Loa on 09/14/2022 13:31:50 Bradley [name] ([id number]) [id number] Page 6 of 7 -------------------------------------------------------------------------------- Wound Assessment Details Patient Name: Date of Service: Bradley Pink [name] 09/14/2022 1:00 PM Medical Record Number: [id number] Patient Account Number: [id number] Date of Birth/Sex: Treating Bradley [name]: 07-19-1945 (77 y.o. Bradley [name] Primary Care Moises Terpstra: Bradley [name] Other Clinician: Betha Loa Referring Morrison Masser: Treating Addley Ballinger/Extender: Claudie Fisherman Weeks in [name]: 165 Wound Status Wound Number: 10 Primary Pressure Ulcer Etiology: Wound Location: Midline Sacrum Wound Open Wounding Event: Pressure Injury Status: Date Acquired: 06/07/2015 Comorbid Anemia, Hypertension, History of pressure wounds, Weeks Of [name]: 165 History: Rheumatoid Arthritis, Paraplegia Clustered Wound: No Photos Wound Measurements Length: (cm) 8.5  Width: (cm) 2.7 Depth: (cm) 1.2 Area: (cm) 18.025 Volume: (cm) 21.63 % Reduction in Area: 65% % Reduction in Volume: 93% Epithelialization: None Wound Description Classification: Category/Stage IV Wound Margin: Epibole Exudate Amount: Medium Exudate Type: Serosanguineous Exudate Color: red, brown Foul Odor After Cleansing: No Slough/Fibrino Yes Wound Bed Granulation Amount: Large (67-100%) Exposed Structure Granulation Quality: Red, Pink Fascia  Exposed: No Necrotic Amount: Small (1-33%) Fat Layer (Subcutaneous Tissue) Exposed: Yes Necrotic Quality: Adherent Slough Tendon Exposed: No Muscle Exposed: Yes Necrosis of Muscle: No Joint Exposed: No Bone Exposed: No [name] Notes Wound #10 (Sacrum) Wound Laterality: Midline Cleanser Dakin 16 (oz) 0.25 Discharge Instruction: Use as directed. Peri-Wound Care Topical Primary Dressing Gauze Discharge Instruction: As directed: moistened with Dakins Solution Secondary Dressing ABD Pad 5x9 (in/in) SATURNINO, LIEW [name] ([id number]) [id number] Page 7 of 7 Discharge Instruction: Cover with ABD pad Secured With Medipore T - 3M Medipore H Soft Cloth Surgical T ape ape, 2x2 (in/yd) Compression Wrap Compression Stockings Add-Ons Electronic Signature(s) Signed: 09/14/2022 4:41:17 PM By: Bradley Loa Signed: 09/14/2022 5:50:22 PM By: Bradley [name], BSN, Bradley [name], Bradley [name], Bradley [name], Bradley [name] Entered By: Bradley Loa on 09/14/2022 13:26:20 -------------------------------------------------------------------------------- Vitals Details Patient Name: Date of Service: Bradley Pink [name] 09/14/2022 1:00 PM Medical Record Number: [id number] Patient Account Number: [id number] Date of Birth/Sex: Treating Bradley [name]: 04/06/1946 (77 y.o. Bradley [name] Primary Care Jenin Birdsall: Bradley [name] Other Clinician: Betha Loa Referring Inetta Dicke: Treating Sha Amer/Extender: Bradley Doom in [name]: 165 Vital Signs Time Taken: 13:08 Temperature (F): 98.2 Pulse (bpm): 71 Respiratory Rate (breaths/min): 16 Blood Pressure (mmHg): 121/76 Reference Range: 80 - 120 mg / dl Electronic Signature(s) Signed: 09/14/2022 4:41:17 PM By: Bradley Loa Entered By: Bradley Loa on 09/14/2022 13:09:52

## 2022-09-15 NOTE — Progress Notes (Signed)
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 Santyl would be extremely expensive for the amount he would need and likely cost prohibitive. 03/29/16; we'll follow this patient on an every two-week basis. He has a fairly substantial stage 3 pressure ulcer over his lower sacrum and coccyx and extending into his bilateral gluteal areas left greater than right. He now has home health[facility]. He is applying Medihoney, kerlix and border foam. He arrives today  with the intake nurse reporting a large amount of drainage. The patient stated he put his dressing on it 7:00 this morning by the time he arrived here at 10 there was already a moderate to a large amount of drainage. I once again reviewed his history. He had an attempted closure with myocutaneous flap earlier this year at [facility]. This did not go well. He was offered a diverting colostomy but refused. He is not a candidate for a wound VAC as the actual wound is precariously close to his anal opening. As mentioned he does have [facility] but miraculously this patient who is a paraplegic is actually changing the dressings himself. 04/12/16 patient presents today for a follow-up of his essentially large sacral pressure ulcer stage III.  Nothing has changed dramatically since I last saw him about one month ago. He has seen Dr. Leanord Hawking once the interim. With that being said patient's wound appears somewhat less macerated today compared to previous evaluations. He still has no pain being a quadriplegic. 04-26-16 Bradley [name] returns today for a violation of his stage III sacral pressure ulcer he denies any complaints concerns or issues over the past 2 weeks. He missed to changing dressing twice daily due to drainage although he states this is not an increase in drainage over the past 2 weeks. He does change his dressings independently. He admits to sitting in his motorized chair for no more than 2-3 hours at which time he transfers to bed and rotates lateral position. 05/10/16; Bradley [name] returns today for review of his stage III sacral pressure ulcer. He denies any concerns over the last 2 weeks although he seems to be running out of Aquacel Ag and on those days he uses Medihoney. He has [facility] was supplying his dressings. He still complains of drainage. He does his dressings independently. He has in his motorized chair for 2-3 hours that time other than that he offloads this. Dimensions of the  wound are down 1 cm in both directions. He underwent an aggressive debridement on his last visit of thick circumferential skin and subcutaneous tissue. It is possible at some point in the future he is going to need this done again 05/24/16; the patient returns today for review of his stage III sacral pressure ulcer. We have been using Aquacel Ag he tells me that he changes this up to twice a day. I'm not really certain of the reason for this frequency of changing. He has some involvement from the home health nurses but I think is doing most of the changing himself which I think because of his paraplegia would be a very difficult exercise. Nevertheless he states that there is "wetness". I am not sure if there is another dressing that we could easily changed that much. I'd wanted to change to [facility] but I'll need to have a sense of how frequent he would need to change this. 06/14/16; this is a patient returns for review of his stage III sacral pressure ulcer. We have been using Aquacel Ag and over the last 2 visits he has had extensive debridement so of the thick circumferential skin and subcutaneous tissue that surrounds this wound. In spite of this really absolutely no change in the condition of the wound warrants measurements. We have [facility] home health I believe changing the dressing on 3 occasions the patient states he does this on one occasion himself 06/28/16; this is a patient who has a fairly large stage III sacral pressure ulcer. I changed him to [facility] from Aquacel 2 weeks ago. He returns today in follow-up. In the meantime a nurse from [facility] has calledrequesting ordering of a wound VAC. He had this discussion before. The problem is the proximity of the lowest edge of this wound to the patient's anal opening roughly 3/4 of an inch. Can't see how this can be arranged. Apparently the nurse who is calling has a lot of experience, the question would be then when she is  not available would be doing this. I would not have thought that this wound is not amenable to a wound VAC because of this reason 07/12/16; the patient comes in today and I have signed orders for a wound VAC. The home health team through [facility]  is convinced that he can benefit from this even though there is close proximity to his anal opening beneath the gluteal clefts. The patient does not have a bowel regimen but states he has a bowel movement every 2 days this will also provide some problem with regards to the vac seal RESEAN, BRANDER A ([id number]) [id number] Page 8 of 13 07/26/16; the patient never did obtain a [place] wound VAC as he could not afford the $200 per month co-pay we have been using Hydrofera Blue now for 6 weeks or so. No major change in this wound at all. He is still not interested in the concept of plastic surgery. There changing the dressing every second day 08/09/16; the patient arrives with a wound precisely in the same situation. In keeping with the plan I outlined last time extensive debridement with an open curet the surface of this is not completely viable. Still has some degree of surrounding thick skin and subcutaneous tissue. No evidence of infection. Once again I have had a conversation with him about plastic surgery, he is simply not interested. 08/23/16; wound is really no different. Thick circumferential skin and subcutaneous tissue around the wound edge which is a lot better from debridement we did earlier in the year. The surface of the wound looks viable however with a curet there is definitely a gritty surface to this. We use Medihoney for a while, he could not afford Santyl. I don't think we could get a supply of Iodoflex. He talks a little more positively about the concept of plastic surgery which I've gone over with him today 08/31/16;; patient arrives in clinic today with the wound surface really no different there is no changes in  dimensions. I debrided today surface on the left upper side of this wound aggressively week ago there is no real change here no evidence of epithelialization. The problem with debridement in the clinic is that he believes from this very liberally. We have been using Sorbact. 09/21/16; absolutely no change in the appearance or measurements of this wound. More recently I've been debrided in this aggressively and using sorbact to see if we could get to a better wound surface. Although this visually looks satisfactory, debridement reveals a very gritty surface to this. However even with this debridement and removal of thick nonviable skin and subcutaneous tissue from around the large amount of the circumference of this wound we have made absolutely no progress. This may be an offloading issue I'm just not completely certain. It has 2 close proximity in its inferior aspect to consider negative pressure therapy 10/26/16; READMISSION This patient called our clinic yesterday to report an odor in his wound. He had been to see plastic surgery at [facility] at our request after his last visit on 09/21/16; we have been seeing him for several months with a large stage III wound. He had been sent to general surgery for consideration of a colostomy, that appointment was not until mid June He comes in today with a temperature of 101. He is reporting an odor in the wound since last weekend. 01/10/17 Readmission: 01/10/17 On evaluation today it is noted that patient has been seen by plastic surgery at [facility] since he was last evaluated here. They did discuss with him the possibility of a flap according to the notes but unfortunately at this point he was not quite ready to proceed with surgery and instead wanted to give the Wound VAC a try. In the hospital they were able to get a  good seal on the Wound VAC. Unfortunately since that time they have been having trouble in regard to his current home health company keeping a simple on the  Wound VAC. He would like to switch to a different home health company. With that being said it sounds as if the problem is that his wound VAC is not feeling at the lower portion of his back and he tells me that he can take some of the clear plastic and put over that area when the sill breaks and it will correct it for time. He has no discomfort or pain which is good news. He has been treated with IV vancomycin since he was last seen here and has an appointment with a infectious disease specialist in two days on 01/12/17. Otherwise he was transferred back to [place] for continuing to monitor and manage is wound as she progresses with a Wound VAC for the time being. 01/17/17 on evaluation today patient continues to show evidence of slight improvement with the Wound VAC fortunately there's no evidence of infection or otherwise worsening condition in general. Nonetheless we were unable to get him switch to [facility] in regard to home help from his current company. I'm not sure the reasoning behind but for some reason he was not accepted as a patient with him. Continue to apply the Wound VAC which does still show that some maceration around the wound edges but the wound measurements were slightly improved. No fevers, chills, nausea, or vomiting noted at this time. 02/14/17; this patient I have not seen in 5 months although he has been readmitted to our clinic seen by our physician assistant Allen Derry twice in early August. I have looked through [facility] notes care everywhere. The patient saw plastic surgery in May [Dr. Bhatt}. The patient was sent to general surgery and ultimately had a colostomy placed. On 11/29/16. This was after he was admitted to [facility] sometime in May. An MRI of the pelvis on 5/23 showed osteomyelitis of the coccyx. An attempt was made to drain fluid that was not successful. He was treated with empiric broad-spectrum antibiotics VAC/cefepime/Flagyl starting on 11/02/16 with plans for a 6 week  course. According to their notes he was sent to a nursing home. Was last seen by Dr. Almedia Balls of plastic surgery on 12/28/16. The first part of the note is a long dissertation about the difficulties finding adequate patients for flap closure of pressure ulcers. At that time the wound was noted to be stage IV based I think on underlying infection no exposed bone and healthy granulation tissue. 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 He was last seen by infectious disease 01/12/17 A Dr. Annye Asa. His note says that Mr. Davalos was not interested in a flap closure for referring a trial of the wound VAC. As previously anticipated the wound VAC could not be maintained as an outpatient in the community. He is now using something similar to a Dakin's wet to dry recommended by [facility] VASHE solution. He is placing this twice a day himself. This is almost s hopeless setting in terms of heeling 02/28/17; he is using a Dakin's wet to dry. Most of the wound surface looks satisfactory however the deeper area over his coccyx now has exposed bone I'm not sure if I noted this last week. 03/21/17; patient is usingVASHE solution wet to dry which I gather is a variation on Dakin solution. He has home health changing this 3 times  a week the other days he does this himself. His appointment with plastic surgery 04/18/17; patient continues to use a variant of Dakin solution I believe. His wound continues to have a clean viable surface. The 2 areas of exposed bone in the center of this wound had closed over. He has an appointment with plastic surgery on December 5 at which time I hope that there'll be a plan for myocutaneous flap closure In looking through [place] link I couldn't find any more plastic surgery appointments. I did come across the fact that he is been followed by hematology for a microcytic hypochromic anemia. He had a reasonably normal looking hemoglobin electrophoresis.  His iron level was 10 and according to the patient he is going for IV iron infusions starting tomorrow. He had a sedimentation rate of 74. More problematically from a pure wound care point of view his albumin was 2.7 earlier this month 05/17/17; this is a patient I follow monthly. He has a large now stage IV wound over his bilateral buttocks with close proximity to his anal opening. More recently he has developed a large area with exposed bone in the center of this probably secondary to the underlying osteomyelitis E had in the summer. He also follows with Dr. Almedia Balls at [facility] who is plastic surgery. He had an appointment earlier this month and according to the patient Dr. Almedia Balls does not want to proceed with any attempted flap closure. Although I do not have current access to her note in care everywhere this is likely due to exposed bone. Again according to the patient they did a bone biopsy. He is still using a variant of Dakin solution changing twice a day. He has home health. The patient is not able to give me a firm answer about how long he spends on this in his wheelchair The patient also states that Dr. Almedia Balls wanted to reconsider a wound VAC. I really don't see this as a viable option at least not in the outpatient setting. The wound itself is frankly to close to his anal opening to maintain a seal. The last time we tried to do this home health was unable to manage it. It might be possible to maintain a wound VAC in this setting outside of the home such as a skilled nursing facility or an LTAC however I am doubtful about this even in that setting **** READMISSION 09/21/17-He is here for evaluation of stage IV sacral ulcer. Since his last evaluation here in December he has completed treatment for sacral osteomyelitis. He was at [facility] healthcare for IV therapy and NPWT dressing changes. He was discharged, with home health services, in February. He admits that while in the skilled facility he  had "80%" success with maintaining dressing, since discharge he has had approximately "40%" success with maintaining wound VAC dressing. We discussed at length that this is not a safe or recommended option. We will apply Dakin's wet to dry dressing daily and he will follow-up next week. He is accompanied today by his sister who is willing to assist in dressing changes; they will discuss the social issue as he feels he is capable of changing dressing daily when home health is not able. 09/28/17-He is here in follow-up evaluation for stage IV sacral pressure ulcer. He has been using the Dakin's wet-to-dry daily; he continues with home health. He is not accompanied by anyone at this visit. He will follow up in two weeks per his request/preference. Bradley [name], Bradley [name] ([id number]) [id number]  Page 9 of 13 10/12/17 on evaluation today patient appears to be doing very well. The Dakin solution went to dry packings do seem to be helping him as far as the sacral wound is concerned I'm not seeing anything that has me more concerned as far as infection or otherwise is concerned. Overall I'm pleased with the appearance of the wound. 10/26/17-He is here in follow up evaluation for a stage IV sacral ulcer. He continues with daily Dakin's wet-to-dry. He is voicing no complaints or concerns. He will follow-up in 2 weeks 11/16/17-He is here for follow up evaluation for a palliative stage IV sacral pressure ulcer. We will continue with Dakin's wet-to-dry. He will follow-up in 4 weeks. He is expressing concern/complaint regarding new bed that has arrived, stating he is unable to manipulate/maneuver it due to the bed crank being at the foot of the bed. 12/14/17-He is here for evaluation for palliative stage IV sacral ulcer. He is voicing no complaints or concerns. We will continue with one-to-one ratio of saline and dakins. He will follow-up in 3 weeks 01/04/18-He is here in follow-up evaluation for  palliative stage IV sacral ulcer. He is inquiring about reinstating the negative pressure wound therapy. We discussed at length that the negative pressure wound therapy in the home setting has not been successful for him repeatedly with loss of cereal and unavailability of 24/7 help; reminded him that home health is not available 24/7 when loss of seal occurs. He does verbalize understanding to this and does not pursue. We also discussed the palliative nature of this ulcer (given no significant change/improvement in measurement/appearance, not a candidate for muscle flap per plastic surgery, and continued independent living) and that the goal is for maintenance, decrease in infection and minimizing/avoiding deterioration given that he is independent in his care, does not have home health and requires daily dressing changes secondary to drainage amount. He is inquiring about a wound clinic in [facility], I have informed him that I am unfamiliar with that clinic but that he is encouraged to seek another opinion if that is his desire. We will continue with dakins and he will follow up in three weeks 01/25/18-He is here in follow-up evaluation for palliative stage IV sacral ulcer. He continues with Dakin's/saline 1:1 mixture wet to dry dressing changes. He states he has an appointment at [facility] on 9/17 for evaluation of surgical intervention/closure of the sacral ulcer. He will follow-up here in 4 weeks Readmission: 07/16/2019 upon evaluation today patient appears to be doing really about the same as when he was previously seen here in the wound care center. He most recently was a patient of [name] back when she was still working here in the center but had been referred to [facility] for consideration of a flap. With that being said the surgeon there at [facility] stated that this was too large for her flap and they have been attempting to get this smaller in order to be able to proceed with a flap. Nonetheless unfortunately he has  had a cycle of going back and forth between the osteomyelitis flaring and then sent him back and then making a little bit of progress only to be sent back again. It sounds like most recently they have been using a Iodoflex type dressing at this point which does not seem to have done any harm by any means. With that being said this wound seems to be quite large for using Iodoflex throughout and subsequently I think he may do much better  with the use of Vioxx moistened gauze which would be safe for the new tissue growing and also keep the wound quite nice and clean. The patient is not opposed to this he in fact states that his home health nurse had mentioned this as a possibility as well. 07/23/2019 upon evaluation today patient actually appears to be doing very well in regard to his sacral wound. In fact this is much healthier the measurements not terribly different but again with a wound like this at home necessarily expect a a lot of change as far as the overall measurements are concerned in just 1 week's time. This is going be a much longer term process at this point. With that being said I do think that he is very healthy appearing as far as the base of the wound is concerned. 08/06/2019 upon evaluation today patient actually appears to be doing about the same with regard to his wound to be honest. There is really not a significant improvement overall based on what I am seeing today. Fortunately there is no signs of significant systemic infection. No fevers, chills, nausea, vomiting, or diarrhea. With that being said there is odor to the drainage from the wound and subsequently also what appears to be increased drainage based on what we are seeing today as well as what his home health nurse called [place] about that she was concerned with as well. 08/13/2019 upon evaluation today patient appears to be doing about the same at this point with regard to his wound although I think the dressing may be a  little less drainage wise compared to what it was previous. Fortunately there is no signs of active infection at this time. No fevers, chills, nausea, vomiting, or diarrhea. He has been taking the antibiotic for only 3 days. 08/23/2019 upon evaluation today patient appears to be doing not nearly as well with regard to his wound. In general really not making a lot of progress which is unfortunate. There does not appear to be any signs of active infection at this time. No fevers, chills, nausea, vomiting, or diarrhea. With that being said the patient unfortunately does seem to be experiencing continued epiboly around the edges of the wound as well as significant scar tissue he has been in the majority of his day sitting in his chair which is likely a big reason for all of this. Fortunately there is no signs of active infection at this time. No fevers, chills, nausea, vomiting, or diarrhea. 08/29/2019 upon evaluation today patient's wound actually appears to be showing some signs of improvement. The region of few areas of new skin growth around the edges of the wound even where he has some of the epiboly. This is actually good news and I think that he has been very aggressive and offloading over the past week since we showed him the pictures of his wound last week. There still may be a chance that he is going require some sharp debridement to clear away some of these edges but to be honest I think that is can be quite an undertaking. The main reason is that he has a lot of thickened scar tissue and it is good to bleed quite significantly in my opinion. The I think if organ to do that we may even need to have a portable or disposable cautery in order to make sure that we are able to get the area completely sealed up as far as bleeding is concerned. I do have one that we  can utilize. However being that the wound looks so much better right now would like to give this 2 weeks to see how things stand and look at  that point before making any additional recommendations. 09/12/2019 upon evaluation today patient appears to be making some progress as far as offloading is concerned. He still has a substantial wound but nonetheless he tells me he is off loading much more aggressively at this point. This is obviously good news. No fevers, chills, nausea, vomiting, or diarrhea. 4/22; I have not seen this patient in quite some time. No major change in the condition of the wound or its wound volume. Surrounding maceration of the skin around the edges of the wound. The wound is fairly substantial. T close to the anal opening to consider a wound VAC. He had extensive trials of plastic o surgery at [facility] which really does not result in any improvement. His wound bed is however clean 10/10/2019 upon evaluation today patient appears to be doing well with regard to the wound in the sacral region. He has been tolerating the dressing changes without complication. Fortunately there is no signs of infection and he actually has some epithelial growth noted as well. He tells me has been trying to continue to offload this area which is excellent that is what he needs to do most as far as what he can have in his control. Otherwise I feel like the collagen with a saline moistened gauze behind has been beneficial for him. 5/20; collagen and normal saline wet-to-dry. Although the tissue when this large sacral wound looks reasonable there is been absolutely no benefit to the overall closure. He has macerated skin around this. He tells me that he spends a large part of the day up in the wheelchair. He still has home health 11/07/2019 upon evaluation today patient appears to be doing well with regard to his wounds at this point. Fortunately there is no signs of active infection at this time. Overall I feel like his sacral wound in general appears to be doing quite nicely. I am very pleased with overall how things are progressing and I  feel like the patient is making excellent progress towards resolution. With that being said he still has a long ways to go before we expect to see this completely healed. 11/21/2019 upon evaluation today patient appears to be doing about the same in regard to his sacral wound. Unfortunately he is really not making much in the way of improvement when I questioned him about how much time he is really spending sitting on his bottom he tells me that the majority of his day is mainly in that position though he does spend some time in bed. It does not sound like it is enough however neither does it look like it is enough. 12/24/2019 upon evaluation today patient appears to be doing really about the same there is no significant improvement overall in his wound bed. Last time he was seen here we did have a discussion about the possibility of seeing if I can get a surgeon to evaluate and potentially clear away some of the thickened edges of the wound in order to allow this wound to potentially have a chance of closing with a wound VAC. Right now we could not even get a wound VAC to seal with some of the rolled and thickened edges. With that being said elected to the patient to think about this to discuss today. At this point the patient does  want to see if I could make the referral to potentially see if the surgeon would be able to perform this and subsequently he agrees to go to a skilled nursing facility following if they are able to do this in order to allow for a wound VAC and to get this wound to heal more effectively and quickly. In my opinion the only way that I would recommend that he see the surgeon to clear away the edges would be if he is also in agreement with going to skilled nursing facility as I know he cannot appropriately offload at home to take care of this and that setting. 01/07/2020 on evaluation today patient appears to be doing about the same in regard to his wound. There is no signs of active  infection at this time which is great news. All in all he is really doing about the same. We have made referral to surgery to see if they can potentially perform debridement to clear away some of Bradley [name], Bradley [name] A ([id number]) [id number] Page 10 of 13 the scarred edging of the wound so we could potentially see about a wound VAC to try to help this heal more efficiently. Again we have discussed that if this occurs he would be in a skilled nursing facility following or release would need to be in order to appropriately offload. Nonetheless we are still in the process of working that referral. 01/21/2020 upon evaluation today patient appears to be doing about the same in regard to his sacral wound. He did see Dr. Tonna Boehringer Dr. Geoffery Lyons feeling was that there was not anything surgically he could offer at this point as he did not feel like that a wound VAC would likely be appropriate for the patient. With that being said he did recommend potential for trying to get the patient into a skilled nursing facility as an outpatient and they will get me working on that as well. Fortunately there is no signs of active infection at this time. 11/10; this is a patient that has not been seen here in quite a bit of time while we were in our alternative venue. As well I have not seen this patient in probably over 2 years. As I remember things with him he has had this pressure ulcer encompassing his lower sacrum and coccyx and the surrounding buttocks for a prolonged period of time. When he first came here the surface looked fairly good and the wound was smaller we attempted a wound VAC but we could never keep this in place. Because of nonresponse of the wound to topical dressings we referred him to [name] plastic surgery where preparation was being made for flap closure. Intact he had a colostomy however after that he was deemed to have 2 large wound for flap closure. He did have  osteomyelitis in the coccyx area in 2019. He is using wet-to-dry he has home health. They are managing his ostomy supplies as well. The wound is substantially larger than what I remember. Versus last time he was here to wound months or so ago he has an unstageable wound on the left upper thigh posteriorly. He tells me he is in his chair quite a bit especially recently 12/1; patient comes back to clinic. He did not get the lab work done apparently home health would not do this they referred him to the lab where he is apparently having a CBC checked because he is on iron infusions. We will try to get the lab work  I ordered done there. He also did not get the x-ray he says he does not remember the discussion. The issue is that this large wound has probing areas to bone. He may have underlying osteomyelitis again he was treated for this previously I think in 2019 12/15; the patient did have lab work done. Notable for iron deficiency anemia with a hemoglobin of 9.7 although he is following with hematology for this his CRP was 7.1 which is up from his previous value. sedimentation rate of 91 which is also higher. However he maintains a relatively high sedimentation rate. Most disturbingly was an albumin of 2.8 although this is also somewhat higher than the 2.2 we had previously. With some difficulty I think a home health nurse actually made him some Dakin's solution he is using a Dakin's wet-to-dry to the large sacral wound. He has the area on the left upper thigh as well 06/17/2020; I follow this patient on a monthly basis. 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 At one point he had underlying osteomyelitis here and he received antibiotic therapy. I believe this was done through [facility] We have been using palliative Dakin's solution wet-to-dry on this which is really This very large wound about the same with healthy surface. No active infection that is obvious. He has developed an  area on the left posterior thigh and now a mirror image area on the right posterior thigh noted pressure injuries from the wheelchair. This occurs even though he has a Roho cushion which is apparently new I have been forwarded lab work from 05/15/2020. At that point his hemoglobin was 9.2 down from 13.8 on 10/5 this is microcytic hypochromic. He has known iron deficiency although the exact cause of this is never really been clear to me. He follows with hematology oncology and has been receiving IV iron he is supposed to follow-up in mid January and I told the patient this. From wound care point of view his albumin is 2.9 sedimentation rate is 74. His sedimentation rate has always been elevated in this range however his C- reactive protein was 82 which is way above anything previously measured. I see that on 05/08/2020 this was 7.1 I am not sure why the rapid discrepancy. He is supposed to have an x-ray of his lower sacrum looking for evidence of osteomyelitis that he was previously treated for. Is possible he would also require more advanced imaging such as another C CT or MRI He comes in also with paperwork for a Clinitron air-fluidized bed with a trapeze bar. I asked him if he would be able to transfer in and out of this bed . I am not opposed to ordering it, but I do not want to make his transfers more difficult than they already are. He has a Nurse, adult 07/15/2020; he is managed to get his Clinitron air-fluidized bed. He has a new electric wheelchair with a wheelchair cushion. The x-ray I did showed some sacral bone destruction although this did not seem progressive him last time. He was treated for osteomyelitis in this area previously in 2018. His sedimentation rate is 91 CRP 7.1 albumin 2.8. We are using Dakin's wet-to-dry. He has [facility] home health 4/6; 75-month follow-up. He has a Clinitron air-fluidized bed. He has the wheelchair cushion. His major wound on the coccyx looks somewhat better  including including both of [place] my nurse and my feeling that it is closed in somewhat. Wound is still too close to  his anal opening to consider a wound VAC. At 1 point we were trying to get him into [facility] plastics to get a flap closure but for 1 reason or another this never moved forward I was never really certain what the issue was. He has an area on the left upper thigh in the gluteal fold. This is a clean wound. 6/1; 15-month follow-up. I follow this man on a palliative basis he has a chronic stage IV wound over the lower sacrum coccyx and surrounding buttocks. He has an air-fluidized bed and a wheelchair cushion but he spends more time in the wheelchair than he does off this area by his own admission. I do not think the area is really changed that much although it is probably deeper in 1 section. He does tell us that he was discharged from home health because of lack of change of the wound. This was [employer]. He was treated for underlying osteomyelitis several years ago and he may have underlying chronic osteomyelitis. I do not believe he ever got the x-ray I ordered 8/3; patient presents for his 40-month follow-up. He reports no issues or complaints today. He continues to use Dakin's wet-to-dry dressings to his sacrum and silver alginate to his left lateral thigh wound. He denies signs of infection. 10/5; patient presents for 58-month follow-up. He has no issues or complaints today. He continues to use Dakin's wet-to-dry dressings to his sacrum. He states that the left lateral thigh wound is healed. He denies signs of infection. 12/7; patient presents for 75-month follow-up. He states he would like to obtain a new air loss mattress bed. He states his current one is having technical difficulties. He currently is using Dakin's wet-to-dry dressings. He denies signs of infection. 2/1; patient presents for follow-up. He has no issues or complaints today. He currently uses Dakin's wet-to-dry dressings. He  denies signs of infection. He has his new air loss mattress. 4/5; patient presents for follow-up. He has been using Dakin's wet-to-dry dressings. He has no issues or complaints today. He denies signs of infection. 11-10-2021 upon evaluation today patient's wound is doing better since I last saw him although it has been quite sometime since then to be perfectly honest. In fact I think it may have been even back in 2022 if I am not mistaken. Either way he has been seeing Dr. Mikey Bussing roughly every 2 months per his request. His wound still though smaller does have a significant amount of thick scar tissue surrounding the edge which is making it very difficult to heal to be honest. His son is performing the dressing changes at home currently. 8/23; patient presents for follow-up. He has no issues or complaints today. He has been doing Dakin's wet-to-dry dressings. He is not interested in any further surgical intervention for his sacral wound. He denies signs of infection. 11/15; Patient presents for follow up. He has been using Dakins wet to dry dressings. He has no issues or complaints. 2/7; patient presents for his regular 25-month follow-up. He continues to use Dakin's wet-to-dry dressings. He has no issues or complaints today. Bradley [name], Bradley [name] ([id number]) [id number] Page 11 of 13 4/10; patient presents for follow-up. He has been using Dakin's wet-to-dry dressings. He has no issues or complaints. He denies signs of infection. Objective Constitutional Vitals Time Taken: 1:08 PM, Temperature: 98.2 F, Pulse: 71 bpm, Respiratory Rate: 16 breaths/min, Blood Pressure: 121/76 mmHg. General Notes: Sacral ulcer: Granulation tissue throughout. Not all areas of the wound bed appears healthy. No  probing to bone. No obvious signs of infection. Integumentary (Hair, Skin) Wound #10 status is Open. Original cause of wound was Pressure Injury. The date acquired was: 06/07/2015. The wound has  been in treatment 165 weeks. The wound is located on the Midline Sacrum. The wound measures 8.5cm length x 2.7cm width x 1.2cm depth; 18.025cm^2 area and 21.63cm^3 volume. There is muscle and Fat Layer (Subcutaneous Tissue) exposed. There is a medium amount of serosanguineous drainage noted. The wound margin is epibole. There is large (67-100%) red, [name] granulation within the wound bed. There is a small (1-33%) amount of necrotic tissue within the wound bed including Adherent Slough. 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 He is doing well with Dakin's wet-to-dry dressings. Per patient he needs to come in monthly in order for insurance to cover his air mattress. Plan Follow-up Appointments: Other: - 2 months palliative care Bathing/ Shower/ Hygiene: May shower; gently cleanse wound with antibacterial soap, rinse and pat dry prior to dressing wounds Off-Loading: Roho cushion for wheelchair Low air-loss mattress (Group 2) Turn and reposition every 2 hours WOUND #10: - Sacrum Wound Laterality: Midline Cleanser: Dakin 16 (oz) 0.25 1 x Per Day/30 Days Discharge Instructions: Use as directed. Prim Dressing: Gauze (Generic) 1 x Per Day/30 Days ary Discharge Instructions: As directed: moistened with Dakins Solution Secondary Dressing: ABD Pad 5x9 (in/in) (Dispense As Written) 1 x Per Day/30 Days Discharge Instructions: Cover with ABD pad Secured With: Medipore T - 68M Medipore H Soft Cloth Surgical T ape ape, 2x2 (in/yd) (Dispense As Written) 1 x Per Day/30 Days 1. Dakin's wet-to-dry dressings 2. Follow-up in 1 month Electronic Signature(s) Signed: 09/14/2022 2:45:13 PM By: Geralyn CorwinHoffman, Lavilla Delamora DO Entered By: Geralyn CorwinHoffman, Elianne Gubser on 09/14/2022  13:35:25 -------------------------------------------------------------------------------- ROS/PFSH Details Patient Name: Date of Service: Bradley PinkGIBSO [name], Bradley MES A. 09/14/2022 1:00 PM Medical Record Number: [id number] Patient Account Number: [id number] Date of Birth/Sex: Treating RN: December 09, 1945 (77 y.o. Orvil FeilM) Woody, Kim Fulco, StaffordJAMES A ([id number]) [id number] Page 12 of 13 Primary Care Provider: Marisue IvanLinthavong, Kanhka Other Clinician: Betha LoaVenable, Angie Referring Provider: Treating Provider/Extender: Greta DoomHoffman, Fatoumata Albaugh Linthavong, Kanhka Weeks in Treatment: 165 Label Progress Note Print Version as History and Physical for this encounter Information Obtained From Patient Eyes Medical History: Negative for: Cataracts; Glaucoma; Optic Neuritis Hematologic/Lymphatic Medical History: Positive for: Anemia Negative for: Hemophilia; Human Immunodeficiency Virus; Lymphedema; Sickle Cell Disease Respiratory Medical History: Negative for: Aspiration; Asthma; Chronic Obstructive Pulmonary Disease (COPD); Pneumothorax; Sleep Apnea; Tuberculosis Cardiovascular Medical History: Positive for: Hypertension Negative for: Angina; Arrhythmia; Congestive Heart Failure; Coronary Artery Disease; Deep Vein Thrombosis; Hypotension; Myocardial Infarction; Peripheral Arterial Disease; Peripheral Venous Disease; Phlebitis; Vasculitis Gastrointestinal Medical History: Negative for: Cirrhosis ; Colitis; Crohns; Hepatitis A; Hepatitis B; Hepatitis C Past Medical History Notes: colostomy Endocrine Medical History: Negative for: Type I Diabetes; Type II Diabetes Genitourinary Medical History: Negative for: End Stage Renal Disease Past Medical History Notes: frequent urination Immunological Medical History: Negative for: Lupus Erythematosus; Raynauds; Scleroderma Integumentary (Skin) Medical History: Positive for: History of pressure wounds Musculoskeletal Medical History: Positive for:  Rheumatoid Arthritis Negative for: Gout; Osteoarthritis; Osteomyelitis Neurologic Medical History: Positive for: Paraplegia Negative for: Dementia; Neuropathy; Quadriplegia; Seizure Disorder Oncologic Medical History: Negative for: Received Chemotherapy; Received Radiation Psychiatric Medical History: Paulino RilyGIBSON, Saulo A ([id number]) [id number] Page 13 of 13 Negative for: Anorexia/bulimia; Confinement Anxiety Immunizations Pneumococcal Vaccine: Received Pneumococcal Vaccination: No Immunization  Notes: up to date Implantable Devices None Hospitalization / Surgery History Type of Hospitalization/Surgery December 2020 [facility] Family and Social History Cancer: Yes - Mother; Diabetes: No; Heart Disease: Yes - Mother,Father; Hereditary Spherocytosis: No; Hypertension: No; Kidney Disease: No; Lung Disease: No; Seizures: Yes - Child; Stroke: No; Thyroid Problems: No; Tuberculosis: No; Former smoker; Marital Status - Single; Alcohol Use: Never; Drug Use: No History; Caffeine Use: Moderate - coffee; Financial Concerns: No; Food, Clothing or Shelter Needs: No; Support System Lacking: No; Transportation Concerns: No Electronic Signature(s) Signed: 09/14/2022 2:45:13 PM By: Geralyn Corwin DO Signed: 09/14/2022 5:50:22 PM By: Elliot Gurney, BSN, RN, CWS, Kim RN, BSN Entered By: Geralyn Corwin on 09/14/2022 13:35:55 -------------------------------------------------------------------------------- SuperBill Details Patient Name: Date of Service: Bradley [name] MES A. 09/14/2022 Medical Record Number: [id number] Patient Account Number: [id number] Date of Birth/Sex: Treating RN: [birth year] (77 y.o. 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

## 2022-09-16 DIAGNOSIS — L89209 Pressure ulcer of unspecified hip, unspecified stage: Secondary | ICD-10-CM | POA: Diagnosis not present
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
## 2022-09-21 ENCOUNTER — Ambulatory Visit: Payer: Medicare HMO | Admitting: Urology

## 2022-09-22 ENCOUNTER — Ambulatory Visit: Payer: Medicare HMO | Admitting: Urology
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
## 2022-09-28 ENCOUNTER — Encounter: Payer: Self-pay | Admitting: Urology

## 2022-09-28 ENCOUNTER — Ambulatory Visit (INDEPENDENT_AMBULATORY_CARE_PROVIDER_SITE_OTHER): Payer: Medicare HMO | Admitting: Urology

## 2022-09-28 VITALS — BP 101/68 | HR 97 | Ht 74.0 in | Wt 213.0 lb

## 2022-09-28 DIAGNOSIS — N319 Neuromuscular dysfunction of bladder, unspecified: Secondary | ICD-10-CM

## 2022-09-28 DIAGNOSIS — N138 Other obstructive and reflux uropathy: Secondary | ICD-10-CM

## 2022-09-28 DIAGNOSIS — R319 Hematuria, unspecified: Secondary | ICD-10-CM

## 2022-09-28 DIAGNOSIS — N401 Enlarged prostate with lower urinary tract symptoms: Secondary | ICD-10-CM

## 2022-10-05 DIAGNOSIS — K631 Perforation of intestine (nontraumatic): Secondary | ICD-10-CM | POA: Diagnosis not present

## 2022-10-05 DIAGNOSIS — N319 Neuromuscular dysfunction of bladder, unspecified: Secondary | ICD-10-CM | POA: Diagnosis not present

## 2022-10-05 DIAGNOSIS — L89159 Pressure ulcer of sacral region, unspecified stage: Secondary | ICD-10-CM | POA: Diagnosis not present

## 2022-10-05 DIAGNOSIS — S31000A Unspecified open wound of lower back and pelvis without penetration into retroperitoneum, initial encounter: Secondary | ICD-10-CM | POA: Diagnosis not present

## 2022-10-05 DIAGNOSIS — S81809A Unspecified open wound, unspecified lower leg, initial encounter: Secondary | ICD-10-CM | POA: Diagnosis not present

## 2022-10-05 DIAGNOSIS — Z933 Colostomy status: Secondary | ICD-10-CM | POA: Diagnosis not present

## 2022-10-06 DIAGNOSIS — N319 Neuromuscular dysfunction of bladder, unspecified: Secondary | ICD-10-CM | POA: Diagnosis not present

## 2022-11-08 DIAGNOSIS — E785 Hyperlipidemia, unspecified: Secondary | ICD-10-CM | POA: Diagnosis not present

## 2022-11-08 DIAGNOSIS — D508 Other iron deficiency anemias: Secondary | ICD-10-CM | POA: Diagnosis not present

## 2022-11-08 DIAGNOSIS — I10 Essential (primary) hypertension: Secondary | ICD-10-CM | POA: Diagnosis not present

## 2022-11-14 DIAGNOSIS — S81809A Unspecified open wound, unspecified lower leg, initial encounter: Secondary | ICD-10-CM | POA: Diagnosis not present

## 2022-11-14 DIAGNOSIS — S31000A Unspecified open wound of lower back and pelvis without penetration into retroperitoneum, initial encounter: Secondary | ICD-10-CM | POA: Diagnosis not present

## 2022-11-14 DIAGNOSIS — K631 Perforation of intestine (nontraumatic): Secondary | ICD-10-CM | POA: Diagnosis not present

## 2022-11-14 DIAGNOSIS — L89159 Pressure ulcer of sacral region, unspecified stage: Secondary | ICD-10-CM | POA: Diagnosis not present

## 2022-11-14 DIAGNOSIS — Z933 Colostomy status: Secondary | ICD-10-CM | POA: Diagnosis not present

## 2022-11-14 DIAGNOSIS — N319 Neuromuscular dysfunction of bladder, unspecified: Secondary | ICD-10-CM | POA: Diagnosis not present

## 2022-11-15 DIAGNOSIS — E876 Hypokalemia: Secondary | ICD-10-CM | POA: Diagnosis not present

## 2022-11-15 DIAGNOSIS — D508 Other iron deficiency anemias: Secondary | ICD-10-CM | POA: Diagnosis not present

## 2022-11-15 DIAGNOSIS — E785 Hyperlipidemia, unspecified: Secondary | ICD-10-CM | POA: Diagnosis not present

## 2022-11-15 DIAGNOSIS — I10 Essential (primary) hypertension: Secondary | ICD-10-CM | POA: Diagnosis not present

## 2022-11-15 DIAGNOSIS — F4321 Adjustment disorder with depressed mood: Secondary | ICD-10-CM | POA: Diagnosis not present

## 2022-11-16 ENCOUNTER — Encounter: Payer: Medicare HMO | Attending: Physician Assistant | Admitting: Internal Medicine

## 2022-11-16 DIAGNOSIS — S81802A Unspecified open wound, left lower leg, initial encounter: Secondary | ICD-10-CM | POA: Diagnosis not present

## 2022-11-16 DIAGNOSIS — M8668 Other chronic osteomyelitis, other site: Secondary | ICD-10-CM | POA: Diagnosis not present

## 2022-11-16 DIAGNOSIS — L97111 Non-pressure chronic ulcer of right thigh limited to breakdown of skin: Secondary | ICD-10-CM | POA: Insufficient documentation

## 2022-11-16 DIAGNOSIS — I10 Essential (primary) hypertension: Secondary | ICD-10-CM | POA: Insufficient documentation

## 2022-11-16 DIAGNOSIS — G8220 Paraplegia, unspecified: Secondary | ICD-10-CM | POA: Insufficient documentation

## 2022-11-16 DIAGNOSIS — L89154 Pressure ulcer of sacral region, stage 4: Secondary | ICD-10-CM | POA: Insufficient documentation

## 2022-11-16 DIAGNOSIS — L97129 Non-pressure chronic ulcer of left thigh with unspecified severity: Secondary | ICD-10-CM | POA: Insufficient documentation

## 2022-11-16 DIAGNOSIS — L89153 Pressure ulcer of sacral region, stage 3: Secondary | ICD-10-CM | POA: Diagnosis not present

## 2022-11-16 DIAGNOSIS — Z933 Colostomy status: Secondary | ICD-10-CM | POA: Insufficient documentation

## 2022-11-16 DIAGNOSIS — M069 Rheumatoid arthritis, unspecified: Secondary | ICD-10-CM | POA: Insufficient documentation

## 2022-11-16 DIAGNOSIS — L89892 Pressure ulcer of other site, stage 2: Secondary | ICD-10-CM | POA: Diagnosis not present

## 2022-11-16 DIAGNOSIS — D509 Iron deficiency anemia, unspecified: Secondary | ICD-10-CM | POA: Insufficient documentation

## 2022-11-17 DIAGNOSIS — S81802A Unspecified open wound, left lower leg, initial encounter: Secondary | ICD-10-CM | POA: Diagnosis not present

## 2022-11-17 DIAGNOSIS — Z933 Colostomy status: Secondary | ICD-10-CM | POA: Diagnosis not present

## 2022-11-17 DIAGNOSIS — N319 Neuromuscular dysfunction of bladder, unspecified: Secondary | ICD-10-CM | POA: Diagnosis not present

## 2022-11-17 DIAGNOSIS — S81809A Unspecified open wound, unspecified lower leg, initial encounter: Secondary | ICD-10-CM | POA: Diagnosis not present

## 2022-11-17 DIAGNOSIS — L24A9 Irritant contact dermatitis due friction or contact with other specified body fluids: Secondary | ICD-10-CM | POA: Diagnosis not present

## 2022-11-17 DIAGNOSIS — L89159 Pressure ulcer of sacral region, unspecified stage: Secondary | ICD-10-CM | POA: Diagnosis not present

## 2022-11-17 DIAGNOSIS — S31000A Unspecified open wound of lower back and pelvis without penetration into retroperitoneum, initial encounter: Secondary | ICD-10-CM | POA: Diagnosis not present

## 2022-11-17 DIAGNOSIS — L89154 Pressure ulcer of sacral region, stage 4: Secondary | ICD-10-CM | POA: Diagnosis not present

## 2022-11-17 DIAGNOSIS — K631 Perforation of intestine (nontraumatic): Secondary | ICD-10-CM | POA: Diagnosis not present

## 2022-11-17 NOTE — Progress Notes (Addendum)
Bradley [name], Bradley [name] ([id number]) [id number] Page 1 of 10 Visit Report for 11/16/2022 Arrival Information Details Patient [name]: Date of Service: Bradley [name]. 11/16/2022 12:45 PM Medical Record Number: [id number] Patient Account Number: [id number] Date of Birth/Sex: Treating RN: 09/21/1945 (77 y.o. Bradley [name]) Bradley [name] Primary Care Bradley [name]: Bradley [name] Other Clinician: Referring Bradley [name]: Treating Bradley [name]/Extender: Bradley [name] in Treatment: 174 Visit Information History Since Last Visit Added or deleted any medications: No Patient Arrived: Wheel Chair Any new allergies or adverse reactions: No Arrival Time: 13:15 Had [name] fall or experienced change in No Accompanied By: self activities of daily living that may affect Transfer Assistance: None risk of falls: Patient Identification Verified: Yes Signs or symptoms of abuse/neglect since last visito No Secondary Verification Process Completed: Yes Hospitalized since last visit: No Patient Requires Transmission-Based Precautions: No Implantable device outside of the clinic excluding No Patient Has Alerts: No cellular tissue based products placed in the center since last visit: Has Dressing in Place as Prescribed: Yes Pain Present Now: No Electronic Signature(s) Signed: 11/16/2022 1:16:15 PM By: Bradley Pax RN Entered By: Bradley [name] on 11/16/2022 13:16:15 -------------------------------------------------------------------------------- Clinic Level of Care Assessment Details Patient [name]: Date of Service: Bradley [name]. 11/16/2022 12:45 PM Medical Record Number: [id number] Patient Account Number: [id number] Date of Birth/Sex: Treating RN: 01/20/1946 (77 y.o. Bradley [name]) Bradley [name] Primary Care Bradley [name]: Bradley [name] Other Clinician: Referring Bradley [name]: Treating Bradley [name]/Extender: Bradley [name] in Treatment: 174 Clinic Level of Care Assessment  Items TOOL 4 Quantity Score X- 1 0 Use when only an EandM is performed on FOLLOW-UP visit ASSESSMENTS - Nursing Assessment / Reassessment X- 1 10 Reassessment of Co-morbidities (includes updates in patient status) X- 1 5 Reassessment of Adherence to Treatment Plan Bradley [name], Bradley [name] ([id number]) [id number] Page 2 of 10 ASSESSMENTS - Wound and Skin [name] ssessment / Reassessment []  - Simple Wound Assessment / Reassessment - one wound 0 X- 2 5 Complex Wound Assessment / Reassessment - multiple wounds []  - 0 Dermatologic / Skin Assessment (not related to wound area) ASSESSMENTS - Focused Assessment []  - 0 Circumferential Edema Measurements - multi extremities []  - 0 Nutritional Assessment / Counseling / Intervention []  - 0 Lower Extremity Assessment (monofilament, tuning fork, pulses) []  - 0 Peripheral Arterial Disease Assessment (using hand held doppler) ASSESSMENTS - Ostomy and/or Continence Assessment and Care []  - 0 Incontinence Assessment and Management []  - 0 Ostomy Care Assessment and Management (repouching, etc.) PROCESS - Coordination of Care X - Simple Patient / Family Education for ongoing care 1 15 []  - 0 Complex (extensive) Patient / Family Education for ongoing care []  - 0 Staff obtains Chiropractor, Records, T Results / Process Orders est []  - 0 Staff telephones HHA, Nursing Homes / Clarify orders / etc []  - 0 Routine Transfer to another Facility (non-emergent condition) []  - 0 Routine Hospital Admission (non-emergent condition) []  - 0 New Admissions / Manufacturing engineer / Ordering NPWT Apligraf, etc. , []  - 0 Emergency Hospital Admission (emergent condition) X- 1 10 Simple Discharge Coordination []  - 0 Complex (extensive) Discharge Coordination PROCESS - Special Needs []  - 0 Pediatric / Minor Patient Management []  - 0 Isolation Patient Management []  - 0 Hearing / Language / Visual special needs []  - 0 Assessment of  Community assistance (transportation, D/C planning, etc.) []  - 0 Additional assistance / Altered mentation []  - 0 Support Surface(s) Assessment (bed, cushion, seat, etc.) INTERVENTIONS - Wound Cleansing / Measurement []  - 0 Simple  Wound Cleansing - one wound X- 2 5 Complex Wound Cleansing - multiple wounds X- 1 5 Wound Imaging (photographs - any number of wounds) []  - 0 Wound Tracing (instead of photographs) []  - 0 Simple Wound Measurement - one wound X- 2 5 Complex Wound Measurement - multiple wounds INTERVENTIONS - Wound Dressings X - Small Wound Dressing one or multiple wounds 2 10 []  - 0 Medium Wound Dressing one or multiple wounds []  - 0 Large Wound Dressing one or multiple wounds []  - 0 Application of Medications - topical []  - 0 Application of Medications - injection INTERVENTIONS - Miscellaneous []  - 0 External ear exam Bradley [name], Bradley [name] ([id number]) [id number] Page 3 of 10 []  - 0 Specimen Collection (cultures, biopsies, blood, body fluids, etc.) []  - 0 Specimen(s) / Culture(s) sent or taken to Lab for analysis []  - 0 Patient Transfer (multiple staff / Michiel Sites Lift / Similar devices) []  - 0 Simple Staple / Suture removal (25 or less) []  - 0 Complex Staple / Suture removal (26 or more) []  - 0 Hypo / Hyperglycemic Management (close monitor of Blood Glucose) []  - 0 Ankle / Brachial Index (ABI) - do not check if billed separately X- 1 5 Vital Signs Has the patient been seen at the hospital within the last three years: Yes Total Score: 100 Level Of Care: New/Established - Level 3 Electronic Signature(s) Signed: 11/17/2022 4:13:25 PM By: Bradley Pax RN Entered By: Bradley [name] on 11/17/2022 13:12:08 -------------------------------------------------------------------------------- Encounter Discharge Information Details Patient [name]: Date of Service: Bradley [name]. 11/16/2022 12:45 PM Medical Record Number: [id number] Patient Account  Number: [id number] Date of Birth/Sex: Treating RN: 10-22-1945 (77 y.o. Bradley [name] Primary Care Bradley [name]: Bradley [name] Other Clinician: Referring Danniela Mcbrearty: Treating Kahliyah Dick/Extender: Bradley [name] in Treatment: 174 Encounter Discharge Information Items Post Procedure Vitals Discharge Condition: Stable Temperature (F): 98.4 Ambulatory Status: Wheelchair Pulse (bpm): 72 Discharge Destination: Home Respiratory Rate (breaths/min): 18 Transportation: Private Auto Blood Pressure (mmHg): 134/55 Accompanied By: self Schedule Follow-up Appointment: Yes Clinical Summary of Care: Electronic Signature(s) Signed: 11/16/2022 1:51:51 PM By: Bradley Pax RN Entered By: Bradley [name] on 11/16/2022 13:51:51 -------------------------------------------------------------------------------- Lower Extremity Assessment Details Patient [name]: Date of Service: Bradley [name]. 11/16/2022 12:45 PM Bradley [name] ([id number]) [id number] Page 4 of 10 Medical Record Number: [id number] Patient Account Number: [id number] Date of Birth/Sex: Treating RN: January 29, 1946 (77 y.o. Bradley [name]) Bradley [name] Primary Care Shawnia Vizcarrondo: Bradley [name] Other Clinician: Referring Jwan Hornbaker: Treating Ajwa Kimberley/Extender: Bradley [name] in Treatment: 174 Electronic Signature(s) Signed: 11/16/2022 1:22:29 PM By: Bradley Pax RN Entered By: Bradley [name] on 11/16/2022 13:22:29 -------------------------------------------------------------------------------- Multi Wound Chart Details Patient [name]: Date of Service: Bradley [name]. 11/16/2022 12:45 PM Medical Record Number: [id number] Patient Account Number: [id number] Date of Birth/Sex: Treating RN: 02-08-1946 (77 y.o. Bradley [name]) Bradley [name] Primary Care Austen Oyster: Bradley [name] Other Clinician: Referring Genifer Lazenby: Treating Naiomy Watters/Extender: Bradley [name] in Treatment:  174 Vital Signs Height(in): Pulse(bpm): 72 Weight(lbs): Blood Pressure(mmHg): 134/55 Body Mass Index(BMI): Temperature(F): 98.4 Respiratory Rate(breaths/min): 18 [10:Photos:] [[name]:[name]] Midline Sacrum Left, Posterior Upper Leg [name] Wound Location: Pressure Injury Pressure Injury [name] Wounding Event: Pressure Ulcer Pressure Ulcer [name] Primary Etiology: Anemia, Hypertension, History of Anemia, Hypertension, History of [name] Comorbid History: pressure wounds, Rheumatoid Arthritis, pressure wounds, Rheumatoid Arthritis, Paraplegia Paraplegia 06/07/2015 11/05/2022 [name] Date Acquired: 174 0 [name] Weeks of Treatment: Open Open [name] Wound Status: No No [name] Wound Recurrence: 9x3.5x1.5 1.8x1.5x0.1 [name] Measurements L x W  x D (cm) 24.74 2.121 [name] [name] (cm) : rea 37.11 0.212 [name] Volume (cm) : 52.00% [name] [name] % Reduction in [name] rea: 88.00% [name] [name] % Reduction in Volume: Category/Stage IV Category/Stage III [name] Classification: Medium Medium [name] Exudate [name] mount: Serosanguineous Serosanguineous [name] Exudate Type: red, brown red, brown [name] Exudate Color: Epibole [name] [name] Wound Margin: Large (67-100%) Large (67-100%) [name] Granulation [name] mount: Red, Pink Red, Pink [name] Granulation Quality: Small (1-33%) Small (1-33%) [name] Necrotic [name] mount: Fat Layer (Subcutaneous Tissue): Yes Fat Layer (Subcutaneous Tissue): Yes [name] Exposed Structures: Muscle: Yes Fascia: No Fascia: No Tendon: No Bradley [name], Bradley [name] ([id number]) [id number] Page 5 of 10 Tendon: No Muscle: No Joint: No Joint: No Bone: No Bone: No None None [name] Epithelialization: Treatment Notes Electronic Signature(s) Signed: 11/16/2022 1:22:34 PM By: Bradley Pax RN Entered By: Bradley [name] on 11/16/2022 13:22:34 -------------------------------------------------------------------------------- Multi-Disciplinary Care Plan Details Patient [name]: Date of Service: Bradley [name]. 11/16/2022 12:45 PM Medical Record  Number: [id number] Patient Account Number: [id number] Date of Birth/Sex: Treating RN: May 11, 1946 (77 y.o. Bradley [name] Primary Care Dayana Dalporto: Bradley [name] Other Clinician: Referring Tani Virgo: Treating Nahiem Dredge/Extender: Bradley [name] in Treatment: 174 Active Inactive Electronic Signature(s) Signed: 11/16/2022 1:22:45 PM By: Bradley Pax RN Entered By: Bradley [name] on 11/16/2022 13:22:45 -------------------------------------------------------------------------------- Pain Assessment Details Patient [name]: Date of Service: Bradley [name]. 11/16/2022 12:45 PM Medical Record Number: [id number] Patient Account Number: [id number] Date of Birth/Sex: Treating RN: [birth year] (77 y.o. Bradley [name] Primary Care Ervine Witucki: Bradley [name] Other Clinician: Referring Shaquoia Miers: Treating Bradley [name]/Extender: Bradley [name] in Treatment: 174 Active Problems Location of Pain Severity and Description of Pain Patient Has Paino No Site Locations [place], LODERMEIER [name] ([id number]) [id number] Page 6 of 10 Pain Management and Medication Current Pain Management: Electronic Signature(s) Signed: 11/16/2022 1:17:29 PM By: Bradley Pax RN Entered By: Bradley [name] on 11/16/2022 13:17:29 -------------------------------------------------------------------------------- Patient/Caregiver Education Details Patient [name]: Date of Service: Bradley [name]. 6/12/2024andnbsp12:45 PM Medical Record Number: [id number] Patient Account Number: [id number] Date of Birth/Gender: Treating RN: 25-Oct-1945 (77 y.o. Bradley [name] Primary Care Physician: Bradley [name] Other Clinician: Referring Physician: Treating Physician/Extender: Bradley [name] in Treatment: 174 Education Assessment Education Provided To: Patient Education Topics Provided Pressure: Handouts: Pressure Injury: Prevention and  Offloading Methods: Explain/Verbal Responses: State content correctly Electronic Signature(s) Signed: 11/17/2022 4:13:25 PM By: Bradley Pax RN Entered By: Bradley [name] on 11/16/2022 13:23:05 Bradley [name] ([id number]) [id number] Page 7 of 10 -------------------------------------------------------------------------------- Wound Assessment Details Patient [name]: Date of Service: Bradley [name]. 11/16/2022 12:45 PM Medical Record Number: [id number] Patient Account Number: [id number] Date of Birth/Sex: Treating RN: Jun 21, 1945 (77 y.o. Bradley [name]) Bradley [name] Primary Care Amad Mau: Bradley [name] Other Clinician: Referring Ishani Goldwasser: Treating Seydou Hearns/Extender: Bradley [name] Weeks in Treatment: 174 Wound Status Wound Number: 10 Primary Pressure Ulcer Etiology: Wound Location: Midline Sacrum Wound Open Wounding Event: Pressure Injury Status: Date Acquired: 06/07/2015 Comorbid Anemia, Hypertension, History of pressure wounds, Weeks Of Treatment: 174 History: Rheumatoid Arthritis, Paraplegia Clustered Wound: No Photos Wound Measurements Length: (cm) 9 Width: (cm) 3 Depth: (cm) 1 Area: (cm) Volume: (cm) % Reduction in Area: 52% .5 % Reduction in Volume: 88% .5 Epithelialization: None 24.74 Tunneling: No 37.11 Undermining: No Wound Description Classification: Category/Stage IV Wound Margin: Epibole Exudate Amount: Medium Exudate Type: Serosanguineous Exudate Color: red, brown Foul Odor After Cleansing: No Slough/Fibrino Yes Wound Bed Granulation Amount: Large (67-100%) Exposed Structure Granulation Quality: Red, Pink  Fascia Exposed: No Necrotic Amount: Small (1-33%) Fat Layer (Subcutaneous Tissue) Exposed: Yes Necrotic Quality: Adherent Slough Tendon Exposed: No Muscle Exposed: Yes Necrosis of Muscle: No Joint Exposed: No Bone Exposed: No Treatment Notes Wound #10 (Sacrum) Wound Laterality: Midline Cleanser Dakin 16  (oz) 0.25 Discharge Instruction: Use as directed. ASAEL, FUTRELL ([id number]) [id number] Page 8 of 10 Peri-Wound Care Topical Primary Dressing Gauze Discharge Instruction: As directed: moistened with Dakins Solution Secondary Dressing ABD Pad 5x9 (in/in) Discharge Instruction: Cover with ABD pad Secured With Medipore T - 64M Medipore H Soft Cloth Surgical T ape ape, 2x2 (in/yd) Compression Wrap Compression Stockings Add-Ons Electronic Signature(s) Signed: 11/16/2022 1:20:20 PM By: Bradley Pax RN Entered By: Bradley [name] on 11/16/2022 13:20:19 -------------------------------------------------------------------------------- Wound Assessment Details Patient [name]: Date of Service: Bradley [name]. 11/16/2022 12:45 PM Medical Record Number: [id number] Patient Account Number: [id number] Date of Birth/Sex: Treating RN: 07-Dec-1945 (77 y.o. Bradley [name]) Bradley [name] Primary Care Kynzli Rease: Bradley [name] Other Clinician: Referring Zamorah Ailes: Treating Kaedan Richert/Extender: Bradley [name] Weeks in Treatment: 174 Wound Status Wound Number: 14 Primary Pressure Ulcer Etiology: Wound Location: Left, Posterior Upper Leg Wound Open Wounding Event: Shear/Friction Status: Date Acquired: 11/05/2022 Comorbid Anemia, Hypertension, History of pressure wounds, Weeks Of Treatment: 0 History: Rheumatoid Arthritis, Paraplegia Clustered Wound: No Photos Wound Measurements Length: (cm) 1.5 Width: (cm) 1.8 Depth: (cm) 0.01 Area: (cm) 2.121 JADAKIS, WIAND [name] ([id number]) Volume: (cm) 0.021 % Reduction in Area: % Reduction in Volume: Tunneling: No Undermining: No [id number] Page 9 of 10 Wound Description Classification: Category/Stage II Wound Margin: Thickened Exudate Amount: Medium Exudate Type: Serous Exudate Color: amber Foul Odor After Cleansing: No Slough/Fibrino No Wound Bed Granulation Amount: Large (67-100%) Exposed  Structure Granulation Quality: Red, Pink Fascia Exposed: No Necrotic Amount: None Present (0%) Fat Layer (Subcutaneous Tissue) Exposed: Yes Tendon Exposed: No Muscle Exposed: No Joint Exposed: No Bone Exposed: No Treatment Notes Wound #14 (Upper Leg) Wound Laterality: Left, Posterior Cleanser Peri-Wound Care Topical Primary Dressing Hydrofera Blue Ready Transfer Foam, 2.5x2.5 (in/in) Discharge Instruction: Apply Hydrofera Blue Ready to wound bed as directed Secondary Dressing ABD Pad 5x9 (in/in) Discharge Instruction: Cover with ABD pad Secured With Medipore T - 64M Medipore H Soft Cloth Surgical T ape ape, 2x2 (in/yd) Compression Wrap Compression Stockings Add-Ons Electronic Signature(s) Signed: 11/17/2022 9:25:41 AM By: Geralyn Corwin DO Signed: 11/17/2022 4:13:25 PM By: Bradley Pax RN Entered By: Geralyn Corwin on 11/17/2022 09:24:49 -------------------------------------------------------------------------------- Vitals Details Patient [name]: Date of Service: Bradley [name]. 11/16/2022 12:45 PM Medical Record Number: [id number] Patient Account Number: [id number] Date of Birth/Sex: Treating RN: 1945/08/30 (77 y.o. Bradley [name] Primary Care Valari Taylor: Bradley [name] Other Clinician: Referring Najia Hurlbutt: Treating Fleming Prill/Extender: Bradley [name] in Treatment: 174 Vital Signs Time Taken: 13:17 Temperature (F): 98.4 Pulse (bpm): 72 NAFTULY, LYGA [name] ([id number]) [id number] Page 10 of 10 Respiratory Rate (breaths/min): 18 Blood Pressure (mmHg): 134/55 Reference Range: 80 - 120 mg / dl Electronic Signature(s) Signed: 11/16/2022 1:17:21 PM By: Bradley Pax RN Entered By: Bradley [name] on 11/16/2022 13:17:21

## 2022-11-19 NOTE — Progress Notes (Signed)
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 given  that he is independent in his care, does not have home health and requires daily dressing changes secondary to drainage amount. He is inquiring about [name] wound clinic in [facility], I have informed him that I am unfamiliar with that clinic but that he is encouraged to seek another opinion if that is his desire. We will continue with dakins and he will follow up in three weeks 01/25/18-He is here in follow-up evaluation for palliative stage IV sacral ulcer. He continues with Dakin's/saline 1:1 mixture wet to dry dressing changes. He states he has an appointment at [facility] on 9/17 for evaluation of surgical intervention/closure of the sacral ulcer. He will follow-up here in 4 weeks Readmission: 07/16/2019 upon evaluation today patient appears to be doing really about the same as when he was previously seen here in the wound care center. He most recently was [name] patient of [name] back when she was still working here in the center but had been referred to [facility] for consideration of [name] flap. With that being said the surgeon there at [facility] stated that this was too large for her flap and they have been attempting to get this smaller in order to be able to proceed with [name] flap. Nonetheless unfortunately he has had [name] cycle of going back and forth between the osteomyelitis flaring and then sent him back and then making [name] little bit of progress only to be sent back again. It sounds like most recently they have been using [name] Iodoflex type dressing at this point which does not seem to have done any harm by any means. With that being said this wound seems to be quite large for using Iodoflex throughout and  subsequently I think he may do much better with the use of Vioxx moistened gauze which would be safe for the new tissue growing and also keep the wound quite nice and clean. The patient is not opposed to this he in fact states that his home health nurse had mentioned this as [name] possibility as well. 07/23/2019 upon evaluation today patient  actually appears to be doing very well in regard to his sacral wound. In fact this is much healthier the measurements not terribly different but again with [name] wound like this at home necessarily expect [name] [name] lot of change as far as the overall measurements are concerned in just 1 week's time. This is going be [name] much longer term process at this point. With that being said I do think that he is very healthy appearing as far as the base of the wound is concerned. 08/06/2019 upon evaluation today patient actually appears to be doing about the same with regard to his wound to be honest. There is really not [name] significant improvement overall based on what I am seeing today. Fortunately there is no signs of significant systemic infection. No fevers, chills, nausea, vomiting, or diarrhea. With that being said there is odor to the drainage from the wound and subsequently also what appears to be increased drainage based on what we are seeing today as well as what his home health nurse called [place] about that she was concerned with as well. 08/13/2019 upon evaluation today patient appears to be doing about the same at this point with regard to his wound although I think the dressing may be [name] little less drainage wise compared to what it was previous. Fortunately there is no signs of active infection at this time. No fevers, chills, nausea, vomiting, or diarrhea. He has been taking the antibiotic for only 3 days. 08/23/2019 upon evaluation today patient appears to be doing not nearly as well with regard to his wound. In general really not making [name] lot of progress which is unfortunate. There does not appear to be any signs of active infection at this time. No fevers, chills, nausea, vomiting, or diarrhea. With that being said the patient unfortunately does seem to be experiencing continued epiboly around the edges of the wound as well as significant scar tissue he has been in the majority of his day sitting in his chair which  is likely [name] big reason for all of this. Fortunately there is no signs of active infection at this time. No fevers, chills, nausea, vomiting, or diarrhea. 08/29/2019 upon evaluation today patient's wound actually appears to be showing some signs of improvement. The region of few areas of new skin growth around the edges of the wound even where he has some of the epiboly. This is actually good news and I think that he has been very aggressive and offloading over the past week since we showed him the pictures of his wound last week. There still may be [name] chance that he is going require some sharp debridement to clear away some of these edges but to be honest I think that is can be quite an undertaking. The main reason is that he has [name] lot of thickened scar tissue and it is good to bleed quite significantly in my opinion. The I think if organ to do that we may even need to have [name] portable or disposable cautery in order to make sure that we are able to get the area completely sealed up as far as  bleeding is concerned. I do have one that we can utilize. However being that the wound looks so much better right now would like to give this 2 weeks to see how things stand and look at that point before making any additional recommendations. 09/12/2019 upon evaluation today patient appears to be making some progress as far as offloading is concerned. He still has [name] substantial wound but nonetheless he tells me he is off loading much more aggressively at this point. This is obviously good news. No fevers, chills, nausea, vomiting, or diarrhea. 4/22; I have not seen this patient in quite some time. No major change in the condition of the wound or its wound volume. Surrounding maceration of the skin around the edges of the wound. The wound is fairly substantial. T close to the anal opening to consider [name] wound VAC. He had extensive trials of plastic o surgery at [facility] which really does not result in any  improvement. His wound bed is however clean Bradley [name], Bradley [name] ([id number]) [id number] Page 5 of 17 10/10/2019 upon evaluation today patient appears to be doing well with regard to the wound in the sacral region. He has been tolerating the dressing changes without complication. Fortunately there is no signs of infection and he actually has some epithelial growth noted as well. He tells me has been trying to continue to offload this area which is excellent that is what he needs to do most as far as what he can have in his control. Otherwise I feel like the collagen with [name] saline moistened gauze behind has been beneficial for him. 5/20; collagen and normal saline wet-to-dry. Although the tissue when this large sacral wound looks reasonable there is been absolutely no benefit to the overall closure. He has macerated skin around this. He tells me that he spends [name] large part of the day up in the wheelchair. He still has home health 11/07/2019 upon evaluation today patient appears to be doing well with regard to his wounds at this point. Fortunately there is no signs of active infection at this time. Overall I feel like his sacral wound in general appears to be doing quite nicely. I am very pleased with overall how things are progressing and I feel like the patient is making excellent progress towards resolution. With that being said he still has [name] long ways to go before we expect to see this completely healed. 11/21/2019 upon evaluation today patient appears to be doing about the same in regard to his sacral wound. Unfortunately he is really not making much in the way of improvement when I questioned him about how much time he is really spending sitting on his bottom he tells me that the majority of his day is mainly in that position though he does spend some time in bed. It does not sound like it is enough however neither does it look like it is enough. 12/24/2019 upon evaluation today  patient appears to be doing really about the same there is no significant improvement overall in his wound bed. Last time he was seen here we did have [name] discussion about the possibility of seeing if I can get [name] surgeon to evaluate and potentially clear away some of the thickened edges of the wound in order to allow this wound to potentially have [name] chance of closing with [name] wound VAC. Right now we could not even get [name] wound VAC to seal with some of the rolled and thickened edges. With that being  said elected to the patient to think about this to discuss today. At this point the patient does want to see if I could make the referral to potentially see if the surgeon would be able to perform this and subsequently he agrees to go to [name] skilled nursing facility following if they are able to do this in order to allow for [name] wound VAC and to get this wound to heal more effectively and quickly. In my opinion the only way that I would recommend that he see the surgeon to clear away the edges would be if he is also in agreement with going to skilled nursing facility as I know he cannot appropriately offload at home to take care of this and that setting. 01/07/2020 on evaluation today patient appears to be doing about the same in regard to his wound. There is no signs of active infection at this time which is great news. All in all he is really doing about the same. We have made referral to surgery to see if they can potentially perform debridement to clear away some of the scarred edging of the wound so we could potentially see about [name] wound VAC to try to help this heal more efficiently. Again we have discussed that if this occurs he would be in [name] skilled nursing facility following or release would need to be in order to appropriately offload. Nonetheless we are still in the process of working that referral. 01/21/2020 upon evaluation today patient appears to be doing about the same in regard to his sacral wound. He did  see Dr. Tonna Boehringer Dr. Geoffery Lyons feeling was that there was not anything surgically he could offer at this point as he did not feel like that [name] wound VAC would likely be appropriate for the patient. With that being said he did recommend potential for trying to get the patient into [name] skilled nursing facility as an outpatient and they will get me working on that as well. Fortunately there is no signs of active infection at this time. 11/10; this is [name] patient that has not been seen here in quite [name] bit of time while we were in our alternative venue. As well I have not seen this patient in probably over 2 years. As I remember things with him he has had this pressure ulcer encompassing his lower sacrum and coccyx and the surrounding buttocks for [name] prolonged period of time. When he first came here the surface looked fairly good and the wound was smaller we attempted [name] wound VAC but we could never keep this in place. Because of nonresponse of the wound to topical dressings we referred him to [facility] plastic surgery where preparation was being made for flap closure. Intact he had [name] colostomy however after that he was deemed to have 2 large wound for flap closure. He did have osteomyelitis in the coccyx area in 2019. He is using wet-to-dry he has home health. They are managing his ostomy supplies as well. The wound is substantially larger than what I remember. Versus last time he was here to wound months or so ago he has an unstageable wound on the left upper thigh posteriorly. He tells me he is in his chair quite [name] bit especially recently 12/1; patient comes back to clinic. He did not get the lab work done apparently home health would not do this they referred him to the lab where he is apparently having [name] CBC checked because he is on iron infusions.  We will try to get the lab work I ordered done there. He also did not get the x-ray he says he does not remember the discussion. The issue is that this large  wound has probing areas to bone. He may have underlying osteomyelitis again he was treated for this previously I think in 2019 12/15; the patient did have lab work done. Notable for iron deficiency anemia with [name] hemoglobin of 9.7 although he is following with hematology for this his CRP was 7.1 which is up from his previous value. sedimentation rate of 91 which is also higher. However he maintains [name] relatively high sedimentation rate. Most disturbingly was an albumin of 2.8 although this is also somewhat higher than the 2.2 we had previously. With some difficulty I think [name] home health nurse actually made him some Dakin's solution he is using [name] Dakin's wet-to-dry to the large sacral wound. He has the area on the left upper thigh as well 06/17/2020; I follow this patient on [name] monthly basis. 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 At one point he had underlying osteomyelitis here and he received antibiotic therapy. I believe this was done through [facility] We have been using palliative Dakin's solution wet-to-dry on this which is really This very large wound about the same with healthy surface. No active infection that is obvious. He has developed an area on the left posterior thigh and now [name] mirror image area on the right posterior thigh noted pressure injuries from the wheelchair. This occurs even though he has [name] Roho cushion which is apparently new I have been forwarded lab work from 05/15/2020. At that point his hemoglobin was 9.2 down from 13.8 on 10/5 this is microcytic hypochromic. He has known iron deficiency although the exact cause of this is never really been clear to me. He follows with hematology oncology and has been receiving IV iron he is supposed to follow-up in mid January and I told the patient this. From wound care point of view his albumin is 2.9 sedimentation rate is 74. His sedimentation rate has always been elevated in this range however his C- reactive  protein was 82 which is way above anything previously measured. I see that on 05/08/2020 this was 7.1 I am not sure why the rapid discrepancy. He is supposed to have an x-ray of his lower sacrum looking for evidence of osteomyelitis that he was previously treated for. Is possible he would also require more advanced imaging such as another C CT or MRI He comes in also with paperwork for [name] Clinitron air-fluidized bed with [name] trapeze bar. I asked him if he would be able to transfer in and out of this bed . I am not opposed to ordering it, but I do not want to make his transfers more difficult than they already are. He has [name] Nurse, adult 07/15/2020; he is managed to get his Clinitron air-fluidized bed. He has [name] new electric wheelchair with [name] wheelchair cushion. The x-ray I did showed some sacral bone destruction although this did not seem progressive him last time. He was treated for osteomyelitis in this area previously in 2018. His sedimentation rate is 91 CRP 7.1 albumin 2.8. We are using Dakin's wet-to-dry. He has [facility] home health 4/6; 85-month follow-up. He has [name] Clinitron air-fluidized bed. He has the wheelchair cushion. His major wound on the coccyx looks somewhat better including including both of [place] my nurse and my feeling that it is closed  in somewhat. Wound is still too close to his anal opening to consider [name] wound VAC. At 1 point we were trying to get him into [facility] plastics to get [name] flap closure but for 1 reason or another this never moved forward I was never really certain what the issue was. He has an area on the left upper thigh in the gluteal fold. This is [name] clean wound. 6/1; 86-month follow-up. I follow this man on [name] palliative basis he has [name] chronic stage IV wound over the lower sacrum coccyx and surrounding buttocks. He has an air-fluidized bed and [name] wheelchair cushion but he spends more time in the wheelchair than he does off this area by his own admission. I do not think the area is really  changed that much although it is probably deeper in 1 section. He does tell us that he was discharged from home health because of lack of change of the wound. This was [employer]. He was treated for underlying osteomyelitis several years ago and he may have underlying chronic osteomyelitis. I do not believe he ever got the x-ray I ordered Bradley [name], Bradley [name] ([id number]) [id number] Page 6 of 17 8/3; patient presents for his 9-month follow-up. He reports no issues or complaints today. He continues to use Dakin's wet-to-dry dressings to his sacrum and silver alginate to his left lateral thigh wound. He denies signs of infection. 10/5; patient presents for 67-month follow-up. He has no issues or complaints today. He continues to use Dakin's wet-to-dry dressings to his sacrum. He states that the left lateral thigh wound is healed. He denies signs of infection. 12/7; patient presents for 106-month follow-up. He states he would like to obtain [name] new air loss mattress bed. He states his current one is having technical difficulties. He currently is using Dakin's wet-to-dry dressings. He denies signs of infection. 2/1; patient presents for follow-up. He has no issues or complaints today. He currently uses Dakin's wet-to-dry dressings. He denies signs of infection. He has his new air loss mattress. 4/5; patient presents for follow-up. He has been using Dakin's wet-to-dry dressings. He has no issues or complaints today. He denies signs of infection. 11-10-2021 upon evaluation today patient's wound is doing better since I last saw him although it has been quite sometime since then to be perfectly honest. In fact I think it may have been even back in 2022 if I am not mistaken. Either way he has been seeing Dr. Mikey Bussing roughly every 2 months per his request. His wound still though smaller does have [name] significant amount of thick scar tissue surrounding the edge which is making it very difficult to  heal to be honest. His son is performing the dressing changes at home currently. 8/23; patient presents for follow-up. He has no issues or complaints today. He has been doing Dakin's wet-to-dry dressings. He is not interested in any further surgical intervention for his sacral wound. He denies signs of infection. 11/15; Patient presents for follow up. He has been using Dakins wet to dry dressings. He has no issues or complaints. 2/7; patient presents for his regular 22-month follow-up. He continues to use Dakin's wet-to-dry dressings. He has no issues or complaints today. 4/10; patient presents for follow-up. He has been using Dakin's wet-to-dry dressings. He has no issues or complaints. He denies signs of infection. 6/12; patient presents for follow-up. He has been using Dakin's wet-to-dry dressings to the sacral wound. He has [name] small area of skin breakdown to the left posterior leg  and has been keeping this covered. He has no issues or complaints today. He denies signs of infection. Electronic Signature(s) Signed: 11/16/2022 4:14:13 PM By: Geralyn Corwin DO Entered By: Geralyn Corwin on 11/16/2022 13:44:17 -------------------------------------------------------------------------------- Physical Exam Details Patient Name: Date of Service: Bradley [name] Bradley [name]. 11/16/2022 12:45 PM Medical Record Number: [id number] Patient Account Number: [id number] Date of Birth/Sex: Treating RN: [birth year] (77 y.o. Bradley [name] Primary Care Provider: Marisue Ivan Other Clinician: Referring Provider: Treating Provider/Extender: Greta Doom in Treatment: 174 Constitutional . Psychiatric . Notes Sacral ulcer: Granulation tissue throughout. Not all areas of the wound bed appears healthy. No probing to bone. No obvious signs of infection. Small area to the posterior left leg limited to skin breakdown. No signs of infection. Electronic Signature(s) Signed: 11/16/2022  4:14:13 PM By: Geralyn Corwin DO Entered By: Geralyn Corwin on 11/16/2022 13:44:50 Bradley [name] ([id number]) [id number] Page 7 of 17 -------------------------------------------------------------------------------- Physician Orders Details Patient Name: Date of Service: Bradley [name] Bradley [name]. 11/16/2022 12:45 PM Medical Record Number: [id number] Patient Account Number: [id number] Date of Birth/Sex: Treating RN: [birth year] (77 y.o. [name]) Yevonne Pax Primary Care Provider: Marisue Ivan Other Clinician: Referring Provider: Treating Provider/Extender: Greta Doom in Treatment: [phone] Verbal / Phone Orders: No Diagnosis Coding ICD-10 Coding Code Description L89.154 Pressure ulcer of sacral region, stage 4 M86.68 Other chronic osteomyelitis, other site G82.20 Paraplegia, unspecified S81.802A Unspecified open wound, left lower leg, initial encounter L24.A9 Irritant contact dermatitis due friction or contact with other specified body fluids Follow-up Appointments Other: - 2 months palliative care Bathing/ Shower/ Hygiene May shower; gently cleanse wound with antibacterial soap, rinse and pat dry prior to dressing wounds Off-Loading Roho cushion for wheelchair Low air-loss mattress (Group 2) Turn and reposition every 2 hours Wound Treatment Wound #10 - Sacrum Wound Laterality: Midline Cleanser: Dakin 16 (oz) 0.25 1 x Per Day/30 Days Discharge Instructions: Use as directed. Prim Dressing: Gauze (Generic) 1 x Per Day/30 Days ary Discharge Instructions: As directed: moistened with Dakins Solution Secondary Dressing: ABD Pad 5x9 (in/in) (Dispense As Written) 1 x Per Day/30 Days Discharge Instructions: Cover with ABD pad Secured With: Medipore T - 79M Medipore H Soft Cloth Surgical T ape ape, 2x2 (in/yd) (Dispense As Written) 1 x Per Day/30 Days Wound #14 - Upper Leg Wound Laterality: Left, Posterior Prim Dressing: Hydrofera Blue  Ready Transfer Foam, 2.5x2.5 (in/in) (DME) (Generic) 1 x Per Day/30 Days ary Discharge Instructions: Apply Hydrofera Blue Ready to wound bed as directed Secondary Dressing: ABD Pad 5x9 (in/in) 1 x Per Day/30 Days Discharge Instructions: Cover with ABD pad Secured With: Medipore T - 79M Medipore H Soft Cloth Surgical T ape ape, 2x2 (in/yd) 1 x Per Day/30 Days Electronic Signature(s) Signed: 11/16/2022 4:14:13 PM By: Geralyn Corwin DO Signed: 11/17/2022 4:13:25 PM By: Yevonne Pax RN Previous Signature: 11/16/2022 1:45:04 PM Version By: Yevonne Pax RN Entered By: Yevonne Pax on 11/16/2022 13:50:01 Bradley [name] ([id number]) [id number] Page 8 of 17 -------------------------------------------------------------------------------- Problem List Details Patient Name: Date of Service: Bradley [name] Bradley [name]. 11/16/2022 12:45 PM Medical Record Number: [id number] Patient Account Number: [id number] Date of Birth/Sex: Treating RN: August 08, 1945 (77 y.o. [name]) Yevonne Pax Primary Care Provider: Marisue Ivan Other Clinician: Referring Provider: Treating Provider/Extender: Greta Doom in Treatment: [phone] Active Problems ICD-10 Encounter Code Description Active Date MDM Diagnosis L89.154 Pressure ulcer of sacral region, stage 4 07/16/2019 No Yes L89.892 Pressure ulcer of other site, stage 2 11/16/2022 No  Yes S81.802A Unspecified open wound, left lower leg, initial encounter 11/16/2022 No Yes M86.68 Other chronic osteomyelitis, other site 07/16/2019 No Yes G82.20 Paraplegia, unspecified 07/16/2019 No Yes Inactive Problems ICD-10 Code Description Active Date Inactive Date 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 measurements not terribly different but again with [name] wound like this at home necessarily expect [name] [name] lot of change as far as the overall measurements are concerned in just 1 week's time. This is going be [name] much longer term process at this point. With that being said I do think that he is very healthy appearing as far as the base of the wound is concerned. 08/06/2019 upon evaluation today patient actually appears to be doing about the same with regard to his wound to be honest. There is really not [name] significant  improvement overall based on what I am seeing today. Fortunately there is no signs of significant systemic infection. No fevers, chills, nausea, vomiting, or diarrhea. With that being said there is odor to the drainage from the wound and subsequently also what appears to be increased drainage based on what we are seeing today as well as what his home health nurse called [place] about that she was concerned with as well. 08/13/2019 upon evaluation today patient appears to be doing about the same at this point with regard to his wound although I think the dressing may be [name] little less drainage wise compared to what it was previous. Fortunately there is no signs of active infection at this time. No fevers, chills, nausea, vomiting, or diarrhea. He has been taking the antibiotic for only 3 days. 08/23/2019 upon evaluation today patient appears to be doing not nearly as well with regard to his wound. In general really not making [name] lot of progress which is unfortunate. There does not appear to be any signs of active infection at this time. No fevers, chills, nausea, vomiting, or diarrhea. With that being said the patient unfortunately does seem to be experiencing continued epiboly around the edges of the wound as well as significant scar tissue he has been in the majority of his day sitting in his chair which is likely [name] big reason for all of this. Fortunately  there is no signs of active infection at this time. No fevers, chills, nausea, vomiting, or diarrhea. 08/29/2019 upon evaluation today patient's wound actually appears to be showing some signs of improvement. The region of few areas of new skin growth around the edges of the wound even where he has some of the epiboly. This is actually good news and I think that he has been very aggressive and offloading over the past week since we showed him the pictures of his wound last week. There still may be [name] chance that he is going require some sharp debridement to clear away some of these edges but to be honest I think that is can be quite an undertaking. The main reason is that he has [name] lot of thickened scar tissue and it is good to bleed quite significantly in my opinion. The I think if organ to do that we may even need to have [name] portable or disposable cautery in order to make sure that we are able to get the area completely sealed up as far as bleeding is concerned. I do have one that we can utilize. However being that the wound looks so much better right now would like to give this 2 weeks to see how things stand and look at that point before making any additional recommendations. 09/12/2019 upon evaluation today patient appears to be making some progress as far as offloading is concerned. He still has [name] substantial wound but nonetheless he tells me he is off loading much more aggressively at this point. This is obviously good news. No fevers, chills, nausea, vomiting, or diarrhea. 4/22; I have not seen this patient in quite some time. No major change in the condition of the wound or its wound volume. Surrounding maceration of the skin around the edges of the wound. The wound is fairly substantial. T close to the anal opening to consider [name] wound VAC. He had extensive trials of plastic o surgery at [facility] which really does not result in any improvement. His wound bed is however clean 10/10/2019 upon  evaluation today patient appears  to be doing well with regard to the wound in the sacral region. He has been tolerating the dressing changes without complication. Fortunately there is no signs of infection and he actually has some epithelial growth noted as well. He tells me has been trying to continue to offload this area which is excellent that is what he needs to do most as far as what he can have in his control. Otherwise I feel like the collagen with [name] saline moistened gauze behind has been beneficial for him. 5/20; collagen and normal saline wet-to-dry. Although the tissue when this large sacral wound looks reasonable there is been absolutely no benefit to the overall closure. He has macerated skin around this. He tells me that he spends [name] large part of the day up in the wheelchair. He still has home health 11/07/2019 upon evaluation today patient appears to be doing well with regard to his wounds at this point. Fortunately there is no signs of active infection at this time. Overall I feel like his sacral wound in general appears to be doing quite nicely. I am very pleased with overall how things are progressing and I feel like the patient is making excellent progress towards resolution. With that being said he still has [name] long ways to go before we expect to see this completely healed. 11/21/2019 upon evaluation today patient appears to be doing about the same in regard to his sacral wound. Unfortunately he is really not making much in the way of improvement when I questioned him about how much time he is really spending sitting on his bottom he tells me that the majority of his day is mainly in that position though he does spend some time in bed. It does not sound like it is enough however neither does it look like it is enough. Bradley [name], Bradley [name] ([id number]) [id number] Page 12 of 17 12/24/2019 upon evaluation today patient appears to be doing really about the same there is  no significant improvement overall in his wound bed. Last time he was seen here we did have [name] discussion about the possibility of seeing if I can get [name] surgeon to evaluate and potentially clear away some of the thickened edges of the wound in order to allow this wound to potentially have [name] chance of closing with [name] wound VAC. Right now we could not even get [name] wound VAC to seal with some of the rolled and thickened edges. With that being said elected to the patient to think about this to discuss today. At this point the patient does want to see if I could make the referral to potentially see if the surgeon would be able to perform this and subsequently he agrees to go to [name] skilled nursing facility following if they are able to do this in order to allow for [name] wound VAC and to get this wound to heal more effectively and quickly. In my opinion the only way that I would recommend that he see the surgeon to clear away the edges would be if he is also in agreement with going to skilled nursing facility as I know he cannot appropriately offload at home to take care of this and that setting. 01/07/2020 on evaluation today patient appears to be doing about the same in regard to his wound. There is no signs of active infection at this time which is great news. All in all he is really doing about the same. We have made referral to surgery to see if  they can potentially perform debridement to clear away some of the scarred edging of the wound so we could potentially see about [name] wound VAC to try to help this heal more efficiently. Again we have discussed that if this occurs he would be in [name] skilled nursing facility following or release would need to be in order to appropriately offload. Nonetheless we are still in the process of working that referral. 01/21/2020 upon evaluation today patient appears to be doing about the same in regard to his sacral wound. He did see Dr. Tonna Boehringer Dr. Geoffery Lyons feeling was that there was not  anything surgically he could offer at this point as he did not feel like that [name] wound VAC would likely be appropriate for the patient. With that being said he did recommend potential for trying to get the patient into [name] skilled nursing facility as an outpatient and they will get me working on that as well. Fortunately there is no signs of active infection at this time. 11/10; this is [name] patient that has not been seen here in quite [name] bit of time while we were in our alternative venue. As well I have not seen this patient in probably over 2 years. As I remember things with him he has had this pressure ulcer encompassing his lower sacrum and coccyx and the surrounding buttocks for [name] prolonged period of time. When he first came here the surface looked fairly good and the wound was smaller we attempted [name] wound VAC but we could never keep this in place. Because of nonresponse of the wound to topical dressings we referred him to [facility] plastic surgery where preparation was being made for flap closure. Intact he had [name] colostomy however after that he was deemed to have 2 large wound for flap closure. He did have osteomyelitis in the coccyx area in 2019. He is using wet-to-dry he has home health. They are managing his ostomy supplies as well. The wound is substantially larger than what I remember. Versus last time he was here to wound months or so ago he has an unstageable wound on the left upper thigh posteriorly. He tells me he is in his chair quite [name] bit especially recently 12/1; patient comes back to clinic. He did not get the lab work done apparently home health would not do this they referred him to the lab where he is apparently having [name] CBC checked because he is on iron infusions. We will try to get the lab work I ordered done there. He also did not get the x-ray he says he does not remember the discussion. The issue is that this large wound has probing areas to bone. He may have underlying  osteomyelitis again he was treated for this previously I think in 2019 12/15; the patient did have lab work done. Notable for iron deficiency anemia with [name] hemoglobin of 9.7 although he is following with hematology for this his CRP was 7.1 which is up from his previous value. sedimentation rate of 91 which is also higher. However he maintains [name] relatively high sedimentation rate. Most disturbingly was an albumin of 2.8 although this is also somewhat higher than the 2.2 we had previously. With some difficulty I think [name] home health nurse actually made him some Dakin's solution he is using [name] Dakin's wet-to-dry to the large sacral wound. He has the area on the left upper thigh as well 06/17/2020; I follow this patient on [name] monthly basis. 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 At one point he had underlying osteomyelitis here and he received antibiotic therapy. I believe this was done through [facility] We have been using palliative Dakin's solution wet-to-dry on this which is really This very large wound about the same with healthy surface. No active infection that is obvious. He has developed an area on the left posterior thigh and now [name] mirror image area on the right posterior thigh noted pressure injuries from the wheelchair. This occurs even though he has [name] Roho cushion which is apparently new I have been forwarded lab work from 05/15/2020. At that point his hemoglobin was 9.2 down from 13.8 on 10/5 this is microcytic hypochromic. He has known iron deficiency although the exact cause of this is never really been clear to me. He follows with hematology oncology and has been receiving IV iron he is supposed to follow-up in mid January and I told the patient this. From wound care point of view his albumin is 2.9 sedimentation rate is 74. His sedimentation rate has always been elevated in this range however his C- reactive protein was 82 which is way above anything previously  measured. I see that on 05/08/2020 this was 7.1 I am not sure why the rapid discrepancy. He is supposed to have an x-ray of his lower sacrum looking for evidence of osteomyelitis that he was previously treated for. Is possible he would also require more advanced imaging such as another C CT or MRI He comes in also with paperwork for [name] Clinitron air-fluidized bed with [name] trapeze bar. I asked him if he would be able to transfer in and out of this bed . I am not opposed to ordering it, but I do not want to make his transfers more difficult than they already are. He has [name] Nurse, adult 07/15/2020; he is managed to get his Clinitron air-fluidized bed. He has [name] new electric wheelchair with [name] wheelchair cushion. The x-ray I did showed some sacral bone destruction although this did not seem progressive him last time. He was treated for osteomyelitis in this area previously in 2018. His sedimentation rate is 91 CRP 7.1 albumin 2.8. We are using Dakin's wet-to-dry. He has [facility] home health 4/6; 13-month follow-up. He has [name] Clinitron air-fluidized bed. He has the wheelchair cushion. His major wound on the coccyx looks somewhat better including including both of [place] my nurse and my feeling that it is closed in somewhat. Wound is still too close to his anal opening to consider [name] wound VAC. At 1 point we were trying to get him into [facility] plastics to get [name] flap closure but for 1 reason or another this never moved forward I was never really certain what the issue was. He has an area on the left upper thigh in the gluteal fold. This is [name] clean wound. 6/1; 109-month follow-up. I follow this man on [name] palliative basis he has [name] chronic stage IV wound over the lower sacrum coccyx and surrounding buttocks. He has an air-fluidized bed and [name] wheelchair cushion but he spends more time in the wheelchair than he does off this area by his own admission. I do not think the area is really changed that much although it is probably deeper in 1  section. He does tell us that he was discharged from home health because of lack of change of the wound. This was [employer]. He was treated for underlying osteomyelitis several years ago and he may have  underlying chronic osteomyelitis. I do not believe he ever got the x-ray I ordered 8/3; patient presents for his 58-month follow-up. He reports no issues or complaints today. He continues to use Dakin's wet-to-dry dressings to his sacrum and silver alginate to his left lateral thigh wound. He denies signs of infection. 10/5; patient presents for 93-month follow-up. He has no issues or complaints today. He continues to use Dakin's wet-to-dry dressings to his sacrum. He states that the left lateral thigh wound is healed. He denies signs of infection. 12/7; patient presents for 12-month follow-up. He states he would like to obtain [name] new air loss mattress bed. He states his current one is having technical difficulties. He currently is using Dakin's wet-to-dry dressings. He denies signs of infection. 2/1; patient presents for follow-up. He has no issues or complaints today. He currently uses Dakin's wet-to-dry dressings. He denies signs of infection. He has his new air loss mattress. 4/5; patient presents for follow-up. He has been using Dakin's wet-to-dry dressings. He has no issues or complaints today. He denies signs of infection. 11-10-2021 upon evaluation today patient's wound is doing better since I last saw him although it has been quite sometime since then to be perfectly honest. In TIANDRE, ELLISTON ([id number]) [id number] Page 13 of 17 fact I think it may have been even back in 2022 if I am not mistaken. Either way he has been seeing Dr. Mikey Bussing roughly every 2 months per his request. His wound still though smaller does have [name] significant amount of thick scar tissue surrounding the edge which is making it very difficult to heal to be honest. His son is performing the dressing  changes at home currently. 8/23; patient presents for follow-up. He has no issues or complaints today. He has been doing Dakin's wet-to-dry dressings. He is not interested in any further surgical intervention for his sacral wound. He denies signs of infection. 11/15; Patient presents for follow up. He has been using Dakins wet to dry dressings. He has no issues or complaints. 2/7; patient presents for his regular 15-month follow-up. He continues to use Dakin's wet-to-dry dressings. He has no issues or complaints today. 4/10; patient presents for follow-up. He has been using Dakin's wet-to-dry dressings. He has no issues or complaints. He denies signs of infection. 6/12; patient presents for follow-up. He has been using Dakin's wet-to-dry dressings to the sacral wound. He has [name] small area of skin breakdown to the left posterior leg and has been keeping this covered. He has no issues or complaints today. He denies signs of infection. Patient History Information obtained from Patient. Family History Cancer - Mother, Heart Disease - Mother,Father, Seizures - Child, No family history of Diabetes, Hereditary Spherocytosis, Hypertension, Kidney Disease, Lung Disease, Stroke, Thyroid Problems, Tuberculosis. Social History Former smoker, Marital Status - Single, Alcohol Use - Never, Drug Use - No History, Caffeine Use - Moderate - coffee. Medical History Eyes Denies history of Cataracts, Glaucoma, Optic Neuritis Hematologic/Lymphatic Patient has history of Anemia Denies history of Hemophilia, Human Immunodeficiency Virus, Lymphedema, Sickle Cell Disease Respiratory Denies history of Aspiration, Asthma, Chronic Obstructive Pulmonary Disease (COPD), Pneumothorax, Sleep Apnea, Tuberculosis Cardiovascular Patient has history of Hypertension Denies history of Angina, Arrhythmia, Congestive Heart Failure, Coronary Artery Disease, Deep Vein Thrombosis, Hypotension, Myocardial Infarction, Peripheral Arterial  Disease, Peripheral Venous Disease, Phlebitis, Vasculitis Gastrointestinal Denies history of Cirrhosis , Colitis, Crohns, Hepatitis [name], Hepatitis B, Hepatitis C Endocrine Denies history of Type I Diabetes, Type II Diabetes Genitourinary Denies history of End  Stage Renal Disease Immunological Denies history of Lupus Erythematosus, Raynauds, Scleroderma Integumentary (Skin) Patient has history of History of pressure wounds Musculoskeletal Patient has history of Rheumatoid Arthritis Denies history of Gout, Osteoarthritis, Osteomyelitis Neurologic Patient has history of Paraplegia Denies history of Dementia, Neuropathy, Quadriplegia, Seizure Disorder Oncologic Denies history of Received Chemotherapy, Received Radiation Psychiatric Denies history of Anorexia/bulimia, Confinement Anxiety Hospitalization/Surgery History - December 2020 [facility]. Medical [name] Surgical History Notes nd Gastrointestinal colostomy Genitourinary frequent urination Objective Constitutional Vitals Time Taken: 1:17 PM, Temperature: 98.4 F, Pulse: 72 bpm, Respiratory Rate: 18 breaths/min, Blood Pressure: 134/55 mmHg. General Notes: Sacral ulcer: Granulation tissue throughout. Not all areas of the wound bed appears healthy. No probing to bone. No obvious signs of infection. Small area to the posterior left leg limited to skin breakdown. No signs of infection. Integumentary (Hair, Skin) Wound #10 status is Open. Original cause of wound was Pressure Injury. The date acquired was: 06/07/2015. The wound has been in treatment 174 weeks. The wound is located on the Midline Sacrum. The wound measures 9cm length x 3.5cm width x 1.5cm depth; 24.74cm^2 area and 37.11cm^3 volume. There is KALIEF, KINGHAM ([id number]) [id number] Page 14 of 17 muscle and Fat Layer (Subcutaneous Tissue) exposed. There is no tunneling or undermining noted. There is [name] medium amount of serosanguineous drainage noted. The wound  margin is epibole. There is large (67-100%) red, [name] granulation within the wound bed. There is [name] small (1-33%) amount of necrotic tissue within the wound bed including Adherent Slough. Wound #14 status is Open. Original cause of wound was Shear/Friction. The date acquired was: 11/05/2022. The wound is located on the Left,Posterior Upper Leg. The wound measures 1.5cm length x 1.8cm width x 0.01cm depth; 2.121cm^2 area and 0.021cm^3 volume. There is Fat Layer (Subcutaneous Tissue) exposed. There is no tunneling or undermining noted. There is [name] medium amount of serous drainage noted. The wound margin is thickened. There is large (67- 100%) red, [name] granulation within the wound bed. There is no necrotic tissue within the wound bed. Assessment Active Problems ICD-10 Pressure ulcer of sacral region, stage 4 Pressure ulcer of other site, stage 2 Unspecified open wound, left lower leg, initial encounter Other chronic osteomyelitis, other site Paraplegia, unspecified Patient's sacral wound appears stable. No signs of infection. There is healthier granulation tissue present today compared to last clinic visit. I recommended continuing Dakin's wet-to-dry dressings here. He has [name] new area of skin breakdown likely caused by friction from being in his wheelchair too long. I recommended Hydrofera Blue here. He knows to call with any questions or concerns. He likes to follow-up every 2 months. I asked that he call us if there is any changes with his wounds. Procedures Wound #14 Pre-procedure diagnosis of Wound #14 is [name] Pressure Ulcer located on the Left,Posterior Upper Leg . There was [name] Chemical/Enzymatic/Mechanical debridement performed by Geralyn Corwin, MD. With the following instrument(s): saline gauze. Other agent used was saline gauze. There was no bleeding. The procedure was tolerated well with [name] pain level of 0 throughout and [name] pain level of 0 following the procedure. Post Debridement Measurements:  1.8cm length x 1.5cm width x 0.1cm depth; 0.212cm^3 volume. Post debridement Stage noted as Category/Stage III. Character of Wound/Ulcer Post Debridement is improved. Post procedure Diagnosis Wound #14: Same as Pre-Procedure Plan Follow-up Appointments: Other: - 2 months palliative care Bathing/ Shower/ Hygiene: May shower; gently cleanse wound with antibacterial soap, rinse and pat dry prior to dressing wounds Off-Loading: Roho cushion for wheelchair  Low air-loss mattress (Group 2) Turn and reposition every 2 hours WOUND #10: - Sacrum Wound Laterality: Midline Cleanser: Dakin 16 (oz) 0.25 1 x Per Day/30 Days Discharge Instructions: Use as directed. Prim Dressing: Gauze (Generic) 1 x Per Day/30 Days ary Discharge Instructions: As directed: moistened with Dakins Solution Secondary Dressing: ABD Pad 5x9 (in/in) (Dispense As Written) 1 x Per Day/30 Days Discharge Instructions: Cover with ABD pad Secured With: Medipore T - 345M Medipore H Soft Cloth Surgical T ape ape, 2x2 (in/yd) (Dispense As Written) 1 x Per Day/30 Days WOUND #14: - Upper Leg Wound Laterality: Left, Posterior Prim Dressing: Hydrofera Blue Ready Transfer Foam, 2.5x2.5 (in/in) (DME) (Generic) 1 x Per Day/30 Days ary Discharge Instructions: Apply Hydrofera Blue Ready to wound bed as directed Secondary Dressing: ABD Pad 5x9 (in/in) 1 x Per Day/30 Days Discharge Instructions: Cover with ABD pad Secured With: Medipore T - 345M Medipore H Soft Cloth Surgical T ape ape, 2x2 (in/yd) 1 x Per Day/30 Days 1. Hydrofera Blue 2. Aggressive offloading 3. Dakin's wet-to-dry dressings 4. Follow-up in 2 months Electronic Signature(s) Signed: 11/17/2022 9:25:41 AM By: Geralyn Corwin DO Previous Signature: 11/16/2022 4:14:13 PM Version By: Geralyn Corwin DO Entered By: Geralyn Corwin on 11/17/2022 09:25:22 Bradley [name] ([id number]) [id number] Page 15 of  17 -------------------------------------------------------------------------------- ROS/PFSH Details Patient Name: Date of Service: Bradley [name] Bradley [name]. 11/16/2022 12:45 PM Medical Record Number: [id number] Patient Account Number: [id number] Date of Birth/Sex: Treating RN: 1946/01/02 (77 y.o. [name]) Yevonne Pax Primary Care Provider: Marisue Ivan Other Clinician: Referring Provider: Treating Provider/Extender: Greta Doom in Treatment: 174 Label Progress Note Print Version as History and Physical for this encounter Information Obtained From Patient Eyes Medical History: Negative for: Cataracts; Glaucoma; Optic Neuritis Hematologic/Lymphatic Medical History: Positive for: Anemia Negative for: Hemophilia; Human Immunodeficiency Virus; Lymphedema; Sickle Cell Disease Respiratory Medical History: Negative for: Aspiration; Asthma; Chronic Obstructive Pulmonary Disease (COPD); Pneumothorax; Sleep Apnea; Tuberculosis Cardiovascular Medical History: Positive for: Hypertension Negative for: Angina; Arrhythmia; Congestive Heart Failure; Coronary Artery Disease; Deep Vein Thrombosis; Hypotension; Myocardial Infarction; Peripheral Arterial Disease; Peripheral Venous Disease; Phlebitis; Vasculitis Gastrointestinal Medical History: Negative for: Cirrhosis ; Colitis; Crohns; Hepatitis [name]; Hepatitis B; Hepatitis C Past Medical History Notes: colostomy Endocrine Medical History: Negative for: Type I Diabetes; Type II Diabetes Genitourinary Medical History: Negative for: End Stage Renal Disease Past Medical History Notes: frequent urination Immunological Medical History: Negative for: Lupus Erythematosus; Raynauds; Scleroderma Integumentary (Skin) Medical History: Positive for: History of pressure wounds Bradley [name], Bradley [name] ([id number]) [id number] Page 16 of 17 Musculoskeletal Medical History: Positive for: Rheumatoid  Arthritis Negative for: Gout; Osteoarthritis; Osteomyelitis Neurologic Medical History: Positive for: Paraplegia Negative for: Dementia; Neuropathy; Quadriplegia; Seizure Disorder Oncologic Medical History: Negative for: Received Chemotherapy; Received Radiation Psychiatric Medical History: Negative for: Anorexia/bulimia; Confinement Anxiety Immunizations Pneumococcal Vaccine: Received Pneumococcal Vaccination: No Immunization Notes: up to date Implantable Devices None Hospitalization / Surgery History Type of Hospitalization/Surgery December 2020 [facility] Family and Social History Cancer: Yes - Mother; Diabetes: No; Heart Disease: Yes - Mother,Father; Hereditary Spherocytosis: No; Hypertension: No; Kidney Disease: No; Lung Disease: No; Seizures: Yes - Child; Stroke: No; Thyroid Problems: No; Tuberculosis: No; Former smoker; Marital Status - Single; Alcohol Use: Never; Drug Use: No History; Caffeine Use: Moderate - coffee; Financial Concerns: No; Food, Clothing or Shelter Needs: No; Support System Lacking: No; Transportation Concerns: No Electronic Signature(s) Signed: 11/16/2022 4:14:13 PM By: Geralyn Corwin DO Signed: 11/17/2022 4:13:25 PM By: Yevonne Pax RN Entered By: Geralyn Corwin on 11/16/2022  13:46:32 -------------------------------------------------------------------------------- SuperBill Details Patient Name: Date of Service: Bradley [name] Bradley [name]. 11/16/2022 Medical Record Number: [id number] Patient Account Number: [id number] Date of Birth/Sex: Treating RN: [birth year] (77 y.o. [name]) Yevonne Pax Primary Care Provider: Marisue Ivan Other Clinician: Referring Provider: Treating Provider/Extender: Claudie Fisherman Weeks in Treatment: 174 Diagnosis Coding ICD-10 Codes Code Description L89.154 Pressure ulcer of sacral region, stage 4 Bradley [name], Bradley [name] ([id number]) [id number] Page 17 of [phone] Pressure ulcer of other  site, stage 2 S81.802A Unspecified open wound, left lower leg, initial encounter M86.68 Other chronic osteomyelitis, other site G82.20 Paraplegia, unspecified Facility Procedures : CPT4 Code: 30865784 Description: 99213 - WOUND CARE VISIT-LEV 3 EST PT Modifier: Quantity: 1 Physician Procedures : CPT4 Code Description Modifier [id number] 99213 - WC PHYS LEVEL 3 - EST PT ICD-10 Diagnosis Description L89.154 Pressure ulcer of sacral region, stage 4 L89.892 Pressure ulcer of other site, stage 2 S81.802A Unspecified open wound, left lower leg, initial  encounter M86.68 Other chronic osteomyelitis, other site Quantity: 1 Electronic Signature(s) Signed: 11/17/2022 1:12:17 PM By: Yevonne Pax RN Previous Signature: 11/16/2022 4:14:13 PM Version By: Geralyn Corwin DO Entered By: Yevonne Pax on 11/17/2022 13:12:17

## 2022-11-21 ENCOUNTER — Telehealth: Payer: Self-pay

## 2022-11-21 NOTE — Telephone Encounter (Signed)
Patient called stating he seen his PCP on 6/11 and had blood work done which PCP noticed his iron level was low. Patient was informed to call us for an earlier appointment so he can get a iron infusion. Patient states he is exteremly tired.

## 2022-11-22 NOTE — Telephone Encounter (Signed)
Patient called back and said to cancel calling him back

## 2022-11-29 DIAGNOSIS — L24A9 Irritant contact dermatitis due friction or contact with other specified body fluids: Secondary | ICD-10-CM | POA: Diagnosis not present

## 2022-11-29 DIAGNOSIS — S31000A Unspecified open wound of lower back and pelvis without penetration into retroperitoneum, initial encounter: Secondary | ICD-10-CM | POA: Diagnosis not present

## 2022-11-29 DIAGNOSIS — Z933 Colostomy status: Secondary | ICD-10-CM | POA: Diagnosis not present

## 2022-11-29 DIAGNOSIS — L89159 Pressure ulcer of sacral region, unspecified stage: Secondary | ICD-10-CM | POA: Diagnosis not present

## 2022-11-29 DIAGNOSIS — K631 Perforation of intestine (nontraumatic): Secondary | ICD-10-CM | POA: Diagnosis not present

## 2022-11-29 DIAGNOSIS — L89154 Pressure ulcer of sacral region, stage 4: Secondary | ICD-10-CM | POA: Diagnosis not present

## 2022-11-29 DIAGNOSIS — S81802A Unspecified open wound, left lower leg, initial encounter: Secondary | ICD-10-CM | POA: Diagnosis not present

## 2022-11-29 DIAGNOSIS — S81809A Unspecified open wound, unspecified lower leg, initial encounter: Secondary | ICD-10-CM | POA: Diagnosis not present

## 2022-11-29 DIAGNOSIS — N319 Neuromuscular dysfunction of bladder, unspecified: Secondary | ICD-10-CM | POA: Diagnosis not present

## 2022-11-30 ENCOUNTER — Encounter (HOSPITAL_BASED_OUTPATIENT_CLINIC_OR_DEPARTMENT_OTHER): Payer: Medicare HMO | Admitting: Internal Medicine

## 2022-11-30 DIAGNOSIS — G8220 Paraplegia, unspecified: Secondary | ICD-10-CM | POA: Diagnosis not present

## 2022-11-30 DIAGNOSIS — I10 Essential (primary) hypertension: Secondary | ICD-10-CM | POA: Diagnosis not present

## 2022-11-30 DIAGNOSIS — L89154 Pressure ulcer of sacral region, stage 4: Secondary | ICD-10-CM | POA: Diagnosis not present

## 2022-11-30 DIAGNOSIS — L89153 Pressure ulcer of sacral region, stage 3: Secondary | ICD-10-CM | POA: Diagnosis not present

## 2022-11-30 DIAGNOSIS — L89892 Pressure ulcer of other site, stage 2: Secondary | ICD-10-CM

## 2022-11-30 DIAGNOSIS — D509 Iron deficiency anemia, unspecified: Secondary | ICD-10-CM | POA: Diagnosis not present

## 2022-11-30 DIAGNOSIS — S81802A Unspecified open wound, left lower leg, initial encounter: Secondary | ICD-10-CM

## 2022-11-30 DIAGNOSIS — M8668 Other chronic osteomyelitis, other site: Secondary | ICD-10-CM

## 2022-11-30 DIAGNOSIS — L97129 Non-pressure chronic ulcer of left thigh with unspecified severity: Secondary | ICD-10-CM | POA: Diagnosis not present

## 2022-11-30 DIAGNOSIS — M069 Rheumatoid arthritis, unspecified: Secondary | ICD-10-CM | POA: Diagnosis not present

## 2022-11-30 DIAGNOSIS — L97111 Non-pressure chronic ulcer of right thigh limited to breakdown of skin: Secondary | ICD-10-CM | POA: Diagnosis not present

## 2022-12-01 NOTE — Progress Notes (Signed)
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 significant pain. Insensate at the site of ulcerations. No fever or chills. Moderate drainage. Understandably frustrated at the chronicity of his problem 07/29/15 stage III pressure ulcer over his coccyx and adjacent right gluteal. He is using Prisma and previously has used Aquacel Ag. There has been small improvements in the measurements although this may be measurement. In talking with him he apparently changes the dressing every day although it appears that only half the days will he have collagen may be the rest of the day following that. He has home health coming in but that description sounded vague as well. He has [name] rotation on his wheelchair and an air mattress. I would need to discuss pressure relieved with him more next time to have [name] sense of this 08/12/15; the patient has been using Hydrofera Bradley. Base of the wound appears healthy. Less adherent surface slough. He has an appointment with the plastic surgery at [facility] on March 29. We have been following him every 2 weeks 09/10/15 patient is been to see plastic surgery at [facility]. He is being scheduled for [name] skin graft to the area. The patient has questions about whether he will be able to manage on his own these to be keeping off the graft site. He tells me he had some sort of fall when he went to [facility]. He apparently traumatized the wound and it is really significantly larger today but without evidence of infection. Roughly 2 cm wider and precariously close now to his perianal area and some aspects. 03/02/16; we have not seen this patient in 5 months. He is been followed by plastic surgery at [facility]. The last note from plastic surgery I see was dated 12/15/15. He underwent some form of tissue graft on  09/24/15. This did not the do very well. According the patient is not felt that he could easily undergo additional plastic surgery secondary to the wounds close proximity to the anus. Apparently the patient was offered [name] diverting colostomy at one point. In any case he is only been using wet to dry dressings surprisingly changing this himself at home using [name] mirror. He does not have home health. He does have [name] level II pressure-relief surface as well as [name] Roho cushion for his wheelchair. In spite of this the wound is considerably larger one than when he was last in the clinic currently measuring 12.5 x 7. There is also an area superiorly in the wound that tunnels more deeply. Clearly [name] stage III wound 03/15/16 patient presents today for reevaluation concerning his midline sacral pressure ulcer. This again is an extensive ulcer which does not extend  to bone fortunately but is sufficiently large to make healing of this wound difficult. Again he has been seen at [facility] where apparently they did discuss with him the possibility of [name] diverting colostomy but he did not want any part of that. Subsequently he has not followed up there currently. He continues overall to do fairly well all things considered with this wound. He is currently utilizing Medihoney Santyl would be extremely expensive for the amount he would need and likely cost prohibitive. 03/29/16; we'll follow this patient on an every two-week basis. He has [name] fairly substantial stage 3 pressure ulcer over his lower sacrum and coccyx and extending into his bilateral gluteal areas left greater than right. He now has home health. [facility]. He is applying Medihoney, kerlix and border foam. He arrives today with the intake nurse reporting [name] large amount of drainage. The patient stated he put his dressing on it 7:00 this morning by the time he arrived here at 10 there was already [name] moderate to [name] large amount of drainage. I once again  reviewed his history. He had an attempted closure with myocutaneous flap earlier this year at [facility]. This did not go well. He was offered [name] diverting colostomy but refused. He is not [name] candidate for [name] wound VAC as the actual wound is precariously close to his anal opening. As mentioned he does have [facility] but miraculously this patient who is [name] paraplegic is actually changing the dressings himself. 04/12/16 patient presents today for [name] follow-up of his essentially large sacral pressure ulcer stage III. Nothing has changed dramatically since I last saw him about one month ago. He has seen Dr. Leanord Hawking once the interim. With that being said patient's wound appears somewhat less macerated today compared to previous evaluations. He still has no pain being [name] quadriplegic. 04-26-16 Mr. Dhondt returns today for [name] violation of his stage III sacral pressure ulcer he denies any complaints concerns or issues over the past 2 weeks. He missed to changing dressing twice daily due to drainage although he states this is not an increase in drainage over the past 2 weeks. He does change his dressings independently. He admits to sitting in his motorized chair for no more than 2-3 hours at which time he transfers to bed and rotates lateral position. 05/10/16; Caldwell Kronenberger returns today for review of his stage III sacral pressure ulcer. He denies any concerns over the last 2 weeks although he seems to be running out of Aquacel Ag and on those days he uses Medihoney. He has [facility] was supplying his dressings. He still complains of drainage. He does his dressings independently. He has in his motorized chair for 2-3 hours that time other than that he offloads this. Dimensions of the wound are down 1 cm in both directions. He underwent an aggressive debridement on his last visit of thick circumferential skin and subcutaneous tissue. It is possible at some point in the future he is going to need this done  again 05/24/16; the patient returns today for review of his stage III sacral pressure ulcer. We have been using Aquacel Ag he tells me that he changes this up to TAJAE, RYBICKI ([id number]) [id number] Page 9 of 15 twice [name] day. I'm not really certain of the reason for this frequency of changing. He has some involvement from the home health nurses but I think is doing most of the changing himself which I think because of his paraplegia  would be [name] very difficult exercise. Nevertheless he states that there is "wetness". I am not sure if there is another dressing that we could easily changed that much. I'd wanted to change to [facility] but I'll need to have [name] sense of how frequent he would need to change this. 06/14/16; this is [name] patient returns for review of his stage III sacral pressure ulcer. We have been using Aquacel Ag and over the last 2 visits he has had extensive debridement so of the thick circumferential skin and subcutaneous tissue that surrounds this wound. In spite of this really absolutely no change in the condition of the wound warrants measurements. We have [facility] home health I believe changing the dressing on 3 occasions the patient states he does this on one occasion himself 06/28/16; this is [name] patient who has [name] fairly large stage III sacral pressure ulcer. I changed him to [facility] from Aquacel 2 weeks ago. He returns today in follow-up. In the meantime [name] nurse from [facility] has calledrequesting ordering of [name] wound VAC. He had this discussion before. The problem is the proximity of the lowest edge of this wound to the patient's anal opening roughly 3/4 of an inch. Can't see how this can be arranged. Apparently the nurse who is calling has [name] lot of experience, the question would be then when she is not available would be doing this. I would not have thought that this wound is not amenable to [name] wound VAC because of this reason 07/12/16; the  patient comes in today and I have signed orders for [name] wound VAC. The home health team through [facility] is convinced that he can benefit from this even though there is close proximity to his anal opening beneath the gluteal clefts. The patient does not have [name] bowel regimen but states he has [name] bowel movement every 2 days this will also provide some problem with regards to the vac seal 07/26/16; the patient never did obtain [name] Medellin wound VAC as he could not afford the $200 per month co-pay we have been using Hydrofera Bradley now for 6 weeks or so. No major change in this wound at all. He is still not interested in the concept of plastic surgery. There changing the dressing every second day 08/09/16; the patient arrives with [name] wound precisely in the same situation. In keeping with the plan I outlined last time extensive debridement with an open curet the surface of this is not completely viable. Still has some degree of surrounding thick skin and subcutaneous tissue. No evidence of infection. Once again I have had [name] conversation with him about plastic surgery, he is simply not interested. 08/23/16; wound is really no different. Thick circumferential skin and subcutaneous tissue around the wound edge which is [name] lot better from debridement we did earlier in the year. The surface of the wound looks viable however with [name] curet there is definitely [name] gritty surface to this. We use Medihoney for [name] while, he could not afford Santyl. I don't think we could get [name] supply of Iodoflex. He talks [name] little more positively about the concept of plastic surgery which I've gone over with him today 08/31/16;; patient arrives in clinic today with the wound surface really no different there is no changes in dimensions. I debrided today surface on the left upper side of this wound aggressively week ago there is no real change here no evidence of epithelialization. The problem with debridement in the clinic is that  he believes from  this very liberally. We have been using Sorbact. 09/21/16; absolutely no change in the appearance or measurements of this wound. More recently I've been debrided in this aggressively and using sorbact to see if we could get to [name] better wound surface. Although this visually looks satisfactory, debridement reveals [name] very gritty surface to this. However even with this debridement and removal of thick nonviable skin and subcutaneous tissue from around the large amount of the circumference of this wound we have made absolutely no progress. This may be an offloading issue I'm just not completely certain. It has 2 close proximity in its inferior aspect to consider negative pressure therapy 10/26/16; READMISSION This patient called our clinic yesterday to report an odor in his wound. He had been to see plastic surgery at [facility] at our request after his last visit on 09/21/16; we have been seeing him for several months with [name] large stage III wound. He had been sent to general surgery for consideration of [name] colostomy, that appointment was not until mid June He comes in today with [name] temperature of 101. He is reporting an odor in the wound since last weekend. 01/10/17 Readmission: 01/10/17 On evaluation today it is noted that patient has been seen by plastic surgery at [facility] since he was last evaluated here. They did discuss with him the possibility of [name] flap according to the notes but unfortunately at this point he was not quite ready to proceed with surgery and instead wanted to give the Wound VAC [name] try. In the hospital they were able to get [name] good seal on the Wound VAC. Unfortunately since that time they have been having trouble in regard to his current home health company keeping [name] simple on the Wound VAC. He would like to switch to [name] different home health company. With that being said it sounds as if the problem is that his wound VAC is not feeling at the lower portion of his back and he tells me that he can take some  of the clear plastic and put over that area when the sill breaks and it will correct it for time. He has no discomfort or pain which is good news. He has been treated with IV vancomycin since he was last seen here and has an appointment with [name] infectious disease specialist in two days on 01/12/17. Otherwise he was transferred back to [place] for continuing to monitor and manage is wound as she progresses with [name] Wound VAC for the time being. 01/17/17 on evaluation today patient continues to show evidence of slight improvement with the Wound VAC fortunately there's no evidence of infection or otherwise worsening condition in general. Nonetheless we were unable to get him switch to [facility] in regard to home help from his current company. I'm not sure the reasoning behind but for some reason he was not accepted as [name] patient with him. Continue to apply the Wound VAC which does still show that some maceration around the wound edges but the wound measurements were slightly improved. No fevers, chills, nausea, or vomiting noted at this time. 02/14/17; this patient I have not seen in 5 months although he has been readmitted to our clinic seen by our physician assistant Allen Derry twice in early August. I have looked through [facility] notes care everywhere. The patient saw plastic surgery in May [Dr. Bhatt}. The patient was sent to general surgery and ultimately had [name] colostomy placed. On 11/29/16. This was after he was admitted  to [facility] sometime in May. An MRI of the pelvis on 5/23 showed osteomyelitis of the coccyx. An attempt was made to drain fluid that was not successful. He was treated with empiric broad-spectrum antibiotics VAC/cefepime/Flagyl starting on 11/02/16 with plans for [name] 6 week course. According to their notes he was sent to [name] nursing home. Was last seen by Dr. Almedia Balls of plastic surgery on 12/28/16. The first part of the note is [name] long dissertation about the difficulties finding adequate patients  for flap closure of pressure ulcers. At that time the wound was noted to be stage IV based I think on underlying infection no exposed bone and healthy granulation tissue. 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 He was last seen by infectious disease 01/12/17 [name] Dr. Annye Asa. His note says that Mr. Schwandt was not interested in [name] flap closure for referring [name] trial of the wound VAC. As previously anticipated the wound VAC could not be maintained as an outpatient in the community. He is now using something similar to [name] Dakin's wet to dry recommended by [facility] VASHE solution. He is placing this twice [name] day himself. This is almost s hopeless setting in terms of heeling 02/28/17; he is using [name] Dakin's wet to dry. Most of the wound surface looks satisfactory however the deeper area over his coccyx now has exposed bone I'm not sure if I noted this last week. 03/21/17; patient is usingVASHE solution wet to dry which I gather is [name] variation on Dakin solution. He has home health changing this 3 times [name] week the other days he does this himself. His appointment with plastic surgery 04/18/17; patient continues to use [name] variant of Dakin solution I believe. His wound continues to have [name] clean viable surface. The 2 areas of exposed bone in the center of this wound had closed over. He has an appointment with plastic surgery on December 5 at which time I hope that there'll be [name] plan for myocutaneous flap closure In looking through [facility] link I couldn't find any more plastic surgery appointments. I did come across the fact that he is been followed by hematology for [name] microcytic hypochromic anemia. He had [name] reasonably normal looking hemoglobin electrophoresis. His iron level was 10 and according to the patient he is going for IV iron infusions starting tomorrow. He had [name] sedimentation rate of 74. More problematically from [name] pure wound care point of view his albumin was 2.7  earlier this month 05/17/17; this is [name] patient I follow monthly. He has [name] large now stage IV wound over his bilateral buttocks with close proximity to his anal opening. More recently he has developed [name] large area with exposed bone in the center of this probably secondary to the underlying osteomyelitis E had in the summer. He also follows with Dr. Almedia Balls at [facility] who is plastic surgery. He had an appointment earlier this month and according to the patient Dr. Almedia Balls does not want to proceed with any attempted flap closure. Although I do not have current access to her note in care everywhere this is likely due to exposed bone. Again according to the patient they did [name] bone biopsy. He is still using [name] variant of Dakin solution changing twice [name] day. He has home health. The patient is not able to give me [name] firm answer about how long he spends on this in his wheelchair Bradley [name], Bradley [name] ([id number]) [id number] Page 10 of 15 The patient  also states that Dr. Almedia Balls wanted to reconsider [name] wound VAC. I really don't see this as [name] viable option at least not in the outpatient setting. The wound itself is frankly to close to his anal opening to maintain [name] seal. The last time we tried to do this home health was unable to manage it. It might be possible to maintain [name] wound VAC in this setting outside of the home such as [name] skilled nursing facility or an LTAC however I am doubtful about this even in that setting **** READMISSION 09/21/17-He is here for evaluation of stage IV sacral ulcer. Since his last evaluation here in December he has completed treatment for sacral osteomyelitis. He was at [facility] for IV therapy and NPWT dressing changes. He was discharged, with home health services, in February. He admits that while in the skilled facility he had "80%" success with maintaining dressing, since discharge he has had approximately "40%" success with maintaining wound VAC dressing.  We discussed at length that this is not [name] safe or recommended option. We will apply Dakin's wet to dry dressing daily and he will follow-up next week. He is accompanied today by his sister who is willing to assist in dressing changes; they will discuss the social issue as he feels he is capable of changing dressing daily when home health is not able. 09/28/17-He is here in follow-up evaluation for stage IV sacral pressure ulcer. He has been using the Dakin's wet-to-dry daily; he continues with home health. He is not accompanied by anyone at this visit. He will follow up in two weeks per his request/preference. 10/12/17 on evaluation today patient appears to be doing very well. The Dakin solution went to dry packings do seem to be helping him as far as the sacral wound is concerned I'm not seeing anything that has me more concerned as far as infection or otherwise is concerned. Overall I'm pleased with the appearance of the wound. 10/26/17-He is here in follow up evaluation for [name] stage IV sacral ulcer. He continues with daily Dakin's wet-to-dry. He is voicing no complaints or concerns. He will follow-up in 2 weeks 11/16/17-He is here for follow up evaluation for [name] palliative stage IV sacral pressure ulcer. We will continue with Dakin's wet-to-dry. He will follow-up in 4 weeks. He is expressing concern/complaint regarding new bed that has arrived, stating he is unable to manipulate/maneuver it due to the bed crank being at the foot of the bed. 12/14/17-He is here for evaluation for palliative stage IV sacral ulcer. He is voicing no complaints or concerns. We will continue with one-to-one ratio of saline and dakins. He will follow-up in 3 weeks 01/04/18-He is here in follow-up evaluation for palliative stage IV sacral ulcer. He is inquiring about reinstating the negative pressure wound therapy. We discussed at length that the negative pressure wound therapy in the home setting has not been successful for him  repeatedly with loss of cereal and unavailability of 24/7 help; reminded him that home health is not available 24/7 when loss of seal occurs. He does verbalize understanding to this and does not pursue. We also discussed the palliative nature of this ulcer (given no significant change/improvement in measurement/appearance, not [name] candidate for muscle flap per plastic surgery, and continued independent living) and that the goal is for maintenance, decrease in infection and minimizing/avoiding deterioration given that he is independent in his care, does not have home health and requires daily dressing changes secondary to drainage amount. He is inquiring  about [name] wound clinic in [facility], I have informed him that I am unfamiliar with that clinic but that he is encouraged to seek another opinion if that is his desire. We will continue with dakins and he will follow up in three weeks 01/25/18-He is here in follow-up evaluation for palliative stage IV sacral ulcer. He continues with Dakin's/saline 1:1 mixture wet to dry dressing changes. He states he has an appointment at [facility] on 9/17 for evaluation of surgical intervention/closure of the sacral ulcer. He will follow-up here in 4 weeks Readmission: 07/16/2019 upon evaluation today patient appears to be doing really about the same as when he was previously seen here in the wound care center. He most recently was [name] patient of [name] back when she was still working here in the center but had been referred to [facility] for consideration of [name] flap. With that being said the surgeon there at [facility] stated that this was too large for her flap and they have been attempting to get this smaller in order to be able to proceed with [name] flap. Nonetheless unfortunately he has had [name] cycle of going back and forth between the osteomyelitis flaring and then sent him back and then making [name] little bit of progress only to be sent back again. It sounds like most recently they have been using [name]  Iodoflex type dressing at this point which does not seem to have done any harm by any means. With that being said this wound seems to be quite large for using Iodoflex throughout and subsequently I think he may do much better with the use of Vioxx moistened gauze which would be safe for the new tissue growing and also keep the wound quite nice and clean. The patient is not opposed to this he in fact states that his home health nurse had mentioned this as [name] possibility as well. 07/23/2019 upon evaluation today patient actually appears to be doing very well in regard to his sacral wound. In fact this is much healthier the measurements not terribly different but again with [name] wound like this at home necessarily expect [name] [name] lot of change as far as the overall measurements are concerned in just 1 week's time. This is going be [name] much longer term process at this point. With that being said I do think that he is very healthy appearing as far as the base of the wound is concerned. 08/06/2019 upon evaluation today patient actually appears to be doing about the same with regard to his wound to be honest. There is really not [name] significant improvement overall based on what I am seeing today. Fortunately there is no signs of significant systemic infection. No fevers, chills, nausea, vomiting, or diarrhea. With that being said there is odor to the drainage from the wound and subsequently also what appears to be increased drainage based on what we are seeing today as well as what his home health nurse called [place] about that she was concerned with as well. 08/13/2019 upon evaluation today patient appears to be doing about the same at this point with regard to his wound although I think the dressing may be [name] little less drainage wise compared to what it was previous. Fortunately there is no signs of active infection at this time. No fevers, chills, nausea, vomiting, or diarrhea. He has been taking the antibiotic for only 3  days. 08/23/2019 upon evaluation today patient appears to be doing not nearly as well with regard to his wound. In  general really not making [name] lot of progress which is unfortunate. There does not appear to be any signs of active infection at this time. No fevers, chills, nausea, vomiting, or diarrhea. With that being said the patient unfortunately does seem to be experiencing continued epiboly around the edges of the wound as well as significant scar tissue he has been in the majority of his day sitting in his chair which is likely [name] big reason for all of this. Fortunately there is no signs of active infection at this time. No fevers, chills, nausea, vomiting, or diarrhea. 08/29/2019 upon evaluation today patient's wound actually appears to be showing some signs of improvement. The region of few areas of new skin growth around the edges of the wound even where he has some of the epiboly. This is actually good news and I think that he has been very aggressive and offloading over the past week since we showed him the pictures of his wound last week. There still may be [name] chance that he is going require some sharp debridement to clear away some of these edges but to be honest I think that is can be quite an undertaking. The main reason is that he has [name] lot of thickened scar tissue and it is good to bleed quite significantly in my opinion. The I think if organ to do that we may even need to have [name] portable or disposable cautery in order to make sure that we are able to get the area completely sealed up as far as bleeding is concerned. I do have one that we can utilize. However being that the wound looks so much better right now would like to give this 2 weeks to see how things stand and look at that point before making any additional recommendations. 09/12/2019 upon evaluation today patient appears to be making some progress as far as offloading is concerned. He still has [name] substantial wound but nonetheless he  tells me he is off loading much more aggressively at this point. This is obviously good news. No fevers, chills, nausea, vomiting, or diarrhea. 4/22; I have not seen this patient in quite some time. No major change in the condition of the wound or its wound volume. Surrounding maceration of the skin around the edges of the wound. The wound is fairly substantial. T close to the anal opening to consider [name] wound VAC. He had extensive trials of plastic o surgery at [facility] which really does not result in any improvement. His wound bed is however clean 10/10/2019 upon evaluation today patient appears to be doing well with regard to the wound in the sacral region. He has been tolerating the dressing changes without complication. Fortunately there is no signs of infection and he actually has some epithelial growth noted as well. He tells me has been trying to continue to offload this area which is excellent that is what he needs to do most as far as what he can have in his control. Otherwise I feel like the collagen with [name] saline moistened gauze behind has been beneficial for him. 5/20; collagen and normal saline wet-to-dry. Although the tissue when this large sacral wound looks reasonable there is been absolutely no benefit to the overall closure. He has macerated skin around this. He tells me that he spends [name] large part of the day up in the wheelchair. He still has home health 11/07/2019 upon evaluation today patient appears to be doing well with regard to his wounds at  this point. Fortunately there is no signs of active infection at this time. Overall I feel like his sacral wound in general appears to be doing quite nicely. I am very pleased with overall how things are progressing and I feel like the patient is making excellent progress towards resolution. With that being said he still has [name] long ways to go before we expect to see this completely healed. Bradley [name], Bradley [name] ([id number])  [id number] Page 11 of 15 11/21/2019 upon evaluation today patient appears to be doing about the same in regard to his sacral wound. Unfortunately he is really not making much in the way of improvement when I questioned him about how much time he is really spending sitting on his bottom he tells me that the majority of his day is mainly in that position though he does spend some time in bed. It does not sound like it is enough however neither does it look like it is enough. 12/24/2019 upon evaluation today patient appears to be doing really about the same there is no significant improvement overall in his wound bed. Last time he was seen here we did have [name] discussion about the possibility of seeing if I can get [name] surgeon to evaluate and potentially clear away some of the thickened edges of the wound in order to allow this wound to potentially have [name] chance of closing with [name] wound VAC. Right now we could not even get [name] wound VAC to seal with some of the rolled and thickened edges. With that being said elected to the patient to think about this to discuss today. At this point the patient does want to see if I could make the referral to potentially see if the surgeon would be able to perform this and subsequently he agrees to go to [name] skilled nursing facility following if they are able to do this in order to allow for [name] wound VAC and to get this wound to heal more effectively and quickly. In my opinion the only way that I would recommend that he see the surgeon to clear away the edges would be if he is also in agreement with going to skilled nursing facility as I know he cannot appropriately offload at home to take care of this and that setting. 01/07/2020 on evaluation today patient appears to be doing about the same in regard to his wound. There is no signs of active infection at this time which is great news. All in all he is really doing about the same. We have made referral to  surgery to see if they can potentially perform debridement to clear away some of the scarred edging of the wound so we could potentially see about [name] wound VAC to try to help this heal more efficiently. Again we have discussed that if this occurs he would be in [name] skilled nursing facility following or release would need to be in order to appropriately offload. Nonetheless we are still in the process of working that referral. 01/21/2020 upon evaluation today patient appears to be doing about the same in regard to his sacral wound. He did see Dr. Tonna Boehringer Dr. Geoffery Lyons feeling was that there was not anything surgically he could offer at this point as he did not feel like that [name] wound VAC would likely be appropriate for the patient. With that being said he did recommend potential for trying to get the patient into [name] skilled nursing facility as an outpatient and they will get me working on  that as well. Fortunately there is no signs of active infection at this time. 11/10; this is [name] patient that has not been seen here in quite [name] bit of time while we were in our alternative venue. As well I have not seen this patient in probably over 2 years. As I remember things with him he has had this pressure ulcer encompassing his lower sacrum and coccyx and the surrounding buttocks for [name] prolonged period of time. When he first came here the surface looked fairly good and the wound was smaller we attempted [name] wound VAC but we could never keep this in place. Because of nonresponse of the wound to topical dressings we referred him to [facility] plastic surgery where preparation was being made for flap closure. Intact he had [name] colostomy however after that he was deemed to have 2 large wound for flap closure. He did have osteomyelitis in the coccyx area in 2019. He is using wet-to-dry he has home health. They are managing his ostomy supplies as well. The wound is substantially larger than what I remember. Versus last time he  was here to wound months or so ago he has an unstageable wound on the left upper thigh posteriorly. He tells me he is in his chair quite [name] bit especially recently 12/1; patient comes back to clinic. He did not get the lab work done apparently home health would not do this they referred him to the lab where he is apparently having [name] CBC checked because he is on iron infusions. We will try to get the lab work I ordered done there. He also did not get the x-ray he says he does not remember the discussion. The issue is that this large wound has probing areas to bone. He may have underlying osteomyelitis again he was treated for this previously I think in 2019 12/15; the patient did have lab work done. Notable for iron deficiency anemia with [name] hemoglobin of 9.7 although he is following with hematology for this his CRP was 7.1 which is up from his previous value. sedimentation rate of 91 which is also higher. However he maintains [name] relatively high sedimentation rate. Most disturbingly was an albumin of 2.8 although this is also somewhat higher than the 2.2 we had previously. With some difficulty I think [name] home health nurse actually made him some Dakin's solution he is using [name] Dakin's wet-to-dry to the large sacral wound. He has the area on the left upper thigh as well 06/17/2020; I follow this patient on [name] monthly basis. 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 At one point he had underlying osteomyelitis here and he received antibiotic therapy. I believe this was done through [facility] We have been using palliative Dakin's solution wet-to-dry on this which is really This very large wound about the same with healthy surface. No active infection that is obvious. He has developed an area on the left posterior thigh and now [name] mirror image area on the right posterior thigh noted pressure injuries from the wheelchair. This occurs even though he has [name] Roho cushion which is apparently new I  have been forwarded lab work from 05/15/2020. At that point his hemoglobin was 9.2 down from 13.8 on 10/5 this is microcytic hypochromic. He has known iron deficiency although the exact cause of this is never really been clear to me. He follows with hematology oncology and has been receiving IV iron he is supposed to follow-up in  mid January and I told the patient this. From wound care point of view his albumin is 2.9 sedimentation rate is 74. His sedimentation rate has always been elevated in this range however his C- reactive protein was 82 which is way above anything previously measured. I see that on 05/08/2020 this was 7.1 I am not sure why the rapid discrepancy. He is supposed to have an x-ray of his lower sacrum looking for evidence of osteomyelitis that he was previously treated for. Is possible he would also require more advanced imaging such as another C CT or MRI He comes in also with paperwork for [name] Clinitron air-fluidized bed with [name] trapeze bar. I asked him if he would be able to transfer in and out of this bed . I am not opposed to ordering it, but I do not want to make his transfers more difficult than they already are. He has [name] Nurse, adult 07/15/2020; he is managed to get his Clinitron air-fluidized bed. He has [name] new electric wheelchair with [name] wheelchair cushion. The x-ray I did showed some sacral bone destruction although this did not seem progressive him last time. He was treated for osteomyelitis in this area previously in 2018. His sedimentation rate is 91 CRP 7.1 albumin 2.8. We are using Dakin's wet-to-dry. He has [facility] home health 4/6; 26-month follow-up. He has [name] Clinitron air-fluidized bed. He has the wheelchair cushion. His major wound on the coccyx looks somewhat better including including both of [place] my nurse and my feeling that it is closed in somewhat. Wound is still too close to his anal opening to consider [name] wound VAC. At 1 point we were trying to get him into [facility] plastics  to get [name] flap closure but for 1 reason or another this never moved forward I was never really certain what the issue was. He has an area on the left upper thigh in the gluteal fold. This is [name] clean wound. 6/1; 64-month follow-up. I follow this man on [name] palliative basis he has [name] chronic stage IV wound over the lower sacrum coccyx and surrounding buttocks. He has an air-fluidized bed and [name] wheelchair cushion but he spends more time in the wheelchair than he does off this area by his own admission. I do not think the area is really changed that much although it is probably deeper in 1 section. He does tell us that he was discharged from home health because of lack of change of the wound. This was [employer]. He was treated for underlying osteomyelitis several years ago and he may have underlying chronic osteomyelitis. I do not believe he ever got the x-ray I ordered 8/3; patient presents for his 27-month follow-up. He reports no issues or complaints today. He continues to use Dakin's wet-to-dry dressings to his sacrum and silver alginate to his left lateral thigh wound. He denies signs of infection. 10/5; patient presents for 30-month follow-up. He has no issues or complaints today. He continues to use Dakin's wet-to-dry dressings to his sacrum. He states that the left lateral thigh wound is healed. He denies signs of infection. 12/7; patient presents for 77-month follow-up. He states he would like to obtain [name] new air loss mattress bed. He states his current one is having technical difficulties. He currently is using Dakin's wet-to-dry dressings. He denies signs of infection. 2/1; patient presents for follow-up. He has no issues or complaints today. He currently uses Dakin's wet-to-dry dressings. He denies signs of infection. He has Saltz, Fayrene Fearing [name] (  [id number]) [id number] Page 12 of 15 his new air loss mattress. 4/5; patient presents for follow-up. He has been using Dakin's  wet-to-dry dressings. He has no issues or complaints today. He denies signs of infection. 11-10-2021 upon evaluation today patient's wound is doing better since I last saw him although it has been quite sometime since then to be perfectly honest. In fact I think it may have been even back in 2022 if I am not mistaken. Either way he has been seeing Dr. Mikey Bussing roughly every 2 months per his request. His wound still though smaller does have [name] significant amount of thick scar tissue surrounding the edge which is making it very difficult to heal to be honest. His son is performing the dressing changes at home currently. 8/23; patient presents for follow-up. He has no issues or complaints today. He has been doing Dakin's wet-to-dry dressings. He is not interested in any further surgical intervention for his sacral wound. He denies signs of infection. 11/15; Patient presents for follow up. He has been using Dakins wet to dry dressings. He has no issues or complaints. 2/7; patient presents for his regular 93-month follow-up. He continues to use Dakin's wet-to-dry dressings. He has no issues or complaints today. 4/10; patient presents for follow-up. He has been using Dakin's wet-to-dry dressings. He has no issues or complaints. He denies signs of infection. 6/12; patient presents for follow-up. He has been using Dakin's wet-to-dry dressings to the sacral wound. He has [name] small area of skin breakdown to the left posterior leg and has been keeping this covered. He has no issues or complaints today. He denies signs of infection. 6/26; patient presents for follow-up. He has been using Dakin's wet-to-dry dressings to the sacral wound he now has another area of skin breakdown just above the previous left posterior leg wound. Appears to be from friction. He has been using Hydrofera Bradley to these wounds. Insurance is asking patient to be seen weekly in order for him to keep his air mattress. After 5 weeks he can go out 2  months per patient Report. Objective Constitutional Vitals Time Taken: 3:02 PM, Temperature: 97.8 F, Pulse: 72 bpm, Respiratory Rate: 18 breaths/min, Blood Pressure: 111/67 mmHg. General Notes: Sacral ulcer: Granulation tissue throughout. Not all areas of the wound bed appears healthy. No probing to bone. No obvious signs of infection. 2 small areas to the posterior left leg limited to skin breakdown. No signs of infection. Integumentary (Hair, Skin) Wound #10 status is Open. Original cause of wound was Pressure Injury. The date acquired was: 06/07/2015. The wound has been in treatment 176 weeks. The wound is located on the Midline Sacrum. The wound measures 8cm length x 3.5cm width x 1.8cm depth; 21.991cm^2 area and 39.584cm^3 volume. There is muscle and Fat Layer (Subcutaneous Tissue) exposed. There is undermining starting at 5:00 and ending at 10:00 with [name] maximum distance of 2.5cm. There is [name] medium amount of serosanguineous drainage noted. The wound margin is epibole. There is large (67-100%) red, [name] granulation within the wound bed. There is [name] small (1-33%) amount of necrotic tissue within the wound bed including Adherent Slough. Wound #14 status is Open. Original cause of wound was Shear/Friction. The date acquired was: 11/05/2022. The wound has been in treatment 2 weeks. The wound is located on the Left,Posterior Upper Leg. The wound measures 4.5cm length x 3.5cm width x 0.2cm depth; 12.37cm^2 area and 2.474cm^3 volume. There is Fat Layer (Subcutaneous Tissue) exposed. There is [name] medium amount of  serous drainage noted. The wound margin is thickened. There is large (67- 100%) red, [name] granulation within the wound bed. There is no necrotic tissue within the wound bed. Assessment Active Problems ICD-10 Pressure ulcer of sacral region, stage 4 Pressure ulcer of other site, stage 2 Unspecified open wound, left lower leg, initial encounter Other chronic osteomyelitis, other site Paraplegia,  unspecified Patient's sacral wound is stable. I recommended continuing Dakin's wet-to-dry dressings. He needs to have an air mattress in order for this to not progress and require hospitalization. We will see him weekly in order for him to meet insurance qualifications to maintain this bed. He has 2 small areas to the left posterior leg limited to skin breakdown. I recommended continue Hydrofera Bradley here. Follow-up in 1 week. Plan Follow-up Appointments: Other: - 2 months palliative care Bathing/ Shower/ Hygiene: May shower; gently cleanse wound with antibacterial soap, rinse and pat dry prior to dressing wounds Off-Loading: Roho cushion for wheelchair Low air-loss mattress (Group 2) Bradley [name], Bradley [name] ([id number]) [id number] Page 13 of 15 Turn and reposition every 2 hours WOUND #10: - Sacrum Wound Laterality: Midline Cleanser: Dakin 16 (oz) 0.25 1 x Per Day/30 Days Discharge Instructions: Use as directed. Prim Dressing: Gauze (Generic) 1 x Per Day/30 Days ary Discharge Instructions: As directed: moistened with Dakins Solution Secondary Dressing: ABD Pad 5x9 (in/in) (Dispense As Written) 1 x Per Day/30 Days Discharge Instructions: Cover with ABD pad Secured With: Medipore T - 41M Medipore H Soft Cloth Surgical T ape ape, 2x2 (in/yd) (Dispense As Written) 1 x Per Day/30 Days WOUND #14: - Upper Leg Wound Laterality: Left, Posterior Prim Dressing: Hydrofera Bradley Ready Transfer Foam, 2.5x2.5 (in/in) (Generic) 1 x Per Day/30 Days ary Discharge Instructions: Apply Hydrofera Bradley Ready to wound bed as directed Secondary Dressing: ABD Pad 5x9 (in/in) 1 x Per Day/30 Days Discharge Instructions: Cover with ABD pad Secured With: Medipore T - 41M Medipore H Soft Cloth Surgical T ape ape, 2x2 (in/yd) 1 x Per Day/30 Days 1. Dakin's wet-to-dry dressings 2. Hydrofera Bradley 3. Aggressive offloading 4. Follow-up in 1 week Electronic Signature(s) Signed: 11/30/2022 4:25:37 PM  By: Geralyn Corwin DO Entered By: Geralyn Corwin on 11/30/2022 15:43:49 -------------------------------------------------------------------------------- ROS/PFSH Details Patient Name: Date of Service: Bradley [name] [name] 11/30/2022 2:45 PM Medical Record Number: [id number] Patient Account Number: [id number] Date of Birth/Sex: Treating RN: Dec 15, 1945 (77 y.o. Bradley [name] Primary Care Provider: Marisue [name] Other Clinician: Betha [name] Referring Provider: Treating Provider/Extender: Bradley [name] in Treatment: 176 Label Progress Note Print Version as History and Physical for this encounter Information Obtained From Patient Eyes Medical History: Negative for: Cataracts; Glaucoma; Optic Neuritis Hematologic/Lymphatic Medical History: Positive for: Anemia Negative for: Hemophilia; Human Immunodeficiency Virus; Lymphedema; Sickle Cell Disease Respiratory Medical History: Negative for: Aspiration; Asthma; Chronic Obstructive Pulmonary Disease (COPD); Pneumothorax; Sleep Apnea; Tuberculosis Cardiovascular Medical History: Positive for: Hypertension Negative for: Angina; Arrhythmia; Congestive Heart Failure; Coronary Artery Disease; Deep Vein Thrombosis; Hypotension; Myocardial Infarction; Peripheral Arterial Disease; Peripheral Venous Disease; Phlebitis; Vasculitis Gastrointestinal ZADEN, SAKO ([id number]) [id number] Page 14 of 15 Medical History: Negative for: Cirrhosis ; Colitis; Crohns; Hepatitis [name]; Hepatitis B; Hepatitis C Past Medical History Notes: colostomy Endocrine Medical History: Negative for: Type I Diabetes; Type II Diabetes Genitourinary Medical History: Negative for: End Stage Renal Disease Past Medical History Notes: frequent urination Immunological Medical History: Negative for: Lupus Erythematosus; Raynauds; Scleroderma Integumentary (Skin) Medical History: Positive for: History of  pressure wounds Musculoskeletal Medical History: Positive for: Rheumatoid  Arthritis Negative for: Gout; Osteoarthritis; Osteomyelitis Neurologic Medical History: Positive for: Paraplegia Negative for: Dementia; Neuropathy; Quadriplegia; Seizure Disorder Oncologic Medical History: Negative for: Received Chemotherapy; Received Radiation Psychiatric Medical History: Negative for: Anorexia/bulimia; Confinement Anxiety Immunizations Pneumococcal Vaccine: Received Pneumococcal Vaccination: No Immunization Notes: up to date Implantable Devices None Hospitalization / Surgery History Type of Hospitalization/Surgery December 2020 [facility] Family and Social History Cancer: Yes - Mother; Diabetes: No; Heart Disease: Yes - Mother,Father; Hereditary Spherocytosis: No; Hypertension: No; Kidney Disease: No; Lung Disease: No; Seizures: Yes - Child; Stroke: No; Thyroid Problems: No; Tuberculosis: No; Former smoker; Marital Status - Single; Alcohol Use: Never; Drug Use: No History; Caffeine Use: Moderate - coffee; Financial Concerns: No; Food, Clothing or Shelter Needs: No; Support System Lacking: No; Transportation Concerns: No Electronic Signature(s) Signed: 11/30/2022 4:25:37 PM By: Geralyn Corwin DO Signed: 12/01/2022 4:37:47 PM By: Elliot Gurney, BSN, RN, CWS, Kim RN, BSN Entered By: Geralyn Corwin on 11/30/2022 15:44:20 Bradley [name], Bradley [name] ([id number]) [id number] Page 15 of 15 -------------------------------------------------------------------------------- SuperBill Details Patient Name: Date of Service: Bradley [name] [name] 11/30/2022 Medical Record Number: [id number] Patient Account Number: [id number] Date of Birth/Sex: Treating RN: 10-15-1945 (77 y.o. Bradley [name] Primary Care Provider: Marisue [name] Other Clinician: Betha [name] Referring Provider: Treating Provider/Extender: Claudie Fisherman Weeks in Treatment: 176 Diagnosis Coding ICD-10  Codes Code Description L89.154 Pressure ulcer of sacral region, stage 4 L89.892 Pressure ulcer of other site, stage 2 S81.802A Unspecified open wound, left lower leg, initial encounter M86.68 Other chronic osteomyelitis, other site G82.20 Paraplegia, unspecified Facility Procedures : CPT4 Code: [id number] Description: 99213 - WOUND CARE VISIT-LEV 3 EST PT Modifier: Quantity: 1 Physician Procedures : CPT4 Code Description Modifier [id number] - WC PHYS LEVEL 3 - EST PT ICD-10 Diagnosis Description L89.154 Pressure ulcer of sacral region, stage 4 L89.892 Pressure ulcer of other site, stage 2 S81.802A Unspecified open wound, left lower leg, initial  encounter M86.68 Other chronic osteomyelitis, other site Quantity: 1 Electronic Signature(s) Signed: 11/30/2022 4:25:37 PM By: Geralyn Corwin DO Entered By: Geralyn Corwin on 11/30/2022 15:44:03

## 2022-12-01 NOTE — Progress Notes (Signed)
DERAL, SCHELLENBERG ([id number]) [id number] Page 1 of 10 Visit Report for 11/30/2022 Arrival Information Details Patient Name: Date of Service: Bradley [name] MES A. 11/30/2022 2:45 PM Medical Record Number: [id number] Patient Account Number: [id number] Date of Birth/Sex: Treating RN: Sep 20, 1945 (77 y.o. Loel Lofty, Selena Batten Primary Care Isaic Syler: Marisue Ivan Other Clinician: Betha Loa Referring Vernadine Coombs: Treating Aigner Horseman/Extender: Greta Doom in Treatment: 176 Visit Information History Since Last Visit Has Dressing in Place as Prescribed: Yes Patient Arrived: Wheel Chair Pain Present Now: No Arrival Time: 15:00 Accompanied By: self Transfer Assistance: Michiel Sites Lift Patient Identification Verified: Yes Secondary Verification Process Completed: Yes Patient Requires Transmission-Based Precautions: No Patient Has Alerts: No Electronic Signature(s) Signed: 12/01/2022 4:37:47 PM By: Elliot Gurney, BSN, RN, CWS, Kim RN, BSN Entered By: Elliot Gurney, BSN, RN, CWS, Kim on 11/30/2022 15:16:13 -------------------------------------------------------------------------------- Clinic Level of Care Assessment Details Patient Name: Date of Service: Bradley [name] MES A. 11/30/2022 2:45 PM Medical Record Number: [id number] Patient Account Number: [id number] Date of Birth/Sex: Treating RN: 1945-06-10 (77 y.o. [name] Primary Care Skyelar Swigart: Marisue Ivan Other Clinician: Betha Loa Referring Masaichi Kracht: Treating Anihya Tuma/Extender: Greta Doom in Treatment: 176 Clinic Level of Care Assessment Items TOOL 4 Quantity Score []  - 0 Use when only an EandM is performed on FOLLOW-UP visit ASSESSMENTS - Nursing Assessment / Reassessment X- 1 10 Reassessment of Co-morbidities (includes updates in patient status) X- 1 5 Reassessment of Adherence to Treatment Plan ASSESSMENTS - Wound and Skin A ssessment / Reassessment []  -  0 Simple Wound Assessment / Reassessment - one wound X- 2 5 Complex Wound Assessment / Reassessment - multiple wounds DILLEN, BELMONTES ([id number]) [id number] Page 2 of 10 []  - 0 Dermatologic / Skin Assessment (not related to wound area) ASSESSMENTS - Focused Assessment []  - 0 Circumferential Edema Measurements - multi extremities []  - 0 Nutritional Assessment / Counseling / Intervention []  - 0 Lower Extremity Assessment (monofilament, tuning fork, pulses) []  - 0 Peripheral Arterial Disease Assessment (using hand held doppler) ASSESSMENTS - Ostomy and/or Continence Assessment and Care []  - 0 Incontinence Assessment and Management []  - 0 Ostomy Care Assessment and Management (repouching, etc.) PROCESS - Coordination of Care []  - 0 Simple Patient / Family Education for ongoing care X- 1 20 Complex (extensive) Patient / Family Education for ongoing care []  - 0 Staff obtains Chiropractor, Records, T Results / Process Orders est []  - 0 Staff telephones HHA, Nursing Homes / Clarify orders / etc []  - 0 Routine Transfer to another Facility (non-emergent condition) []  - 0 Routine Hospital Admission (non-emergent condition) []  - 0 New Admissions / Manufacturing engineer / Ordering NPWT Apligraf, etc. , []  - 0 Emergency Hospital Admission (emergent condition) X- 1 10 Simple Discharge Coordination []  - 0 Complex (extensive) Discharge Coordination PROCESS - Special Needs []  - 0 Pediatric / Minor Patient Management []  - 0 Isolation Patient Management []  - 0 Hearing / Language / Visual special needs []  - 0 Assessment of Community assistance (transportation, D/C planning, etc.) []  - 0 Additional assistance / Altered mentation []  - 0 Support Surface(s) Assessment (bed, cushion, seat, etc.) INTERVENTIONS - Wound Cleansing / Measurement []  - 0 Simple Wound Cleansing - one wound X- 2 5 Complex Wound Cleansing - multiple wounds X- 1 5 Wound Imaging  (photographs - any number of wounds) []  - 0 Wound Tracing (instead of photographs) []  - 0 Simple Wound Measurement - one wound X- 2 5 Complex Wound Measurement - multiple wounds INTERVENTIONS -  Wound Dressings X - Small Wound Dressing one or multiple wounds 2 10 []  - 0 Medium Wound Dressing one or multiple wounds []  - 0 Large Wound Dressing one or multiple wounds []  - 0 Application of Medications - topical []  - 0 Application of Medications - injection INTERVENTIONS - Miscellaneous []  - 0 External ear exam []  - 0 Specimen Collection (cultures, biopsies, blood, body fluids, etc.) []  - 0 Specimen(s) / Culture(s) sent or taken to Lab for analysis AB, LEAMING ([id number]) [id number] Page 3 of 10 X- 1 10 Patient Transfer (multiple staff / Nurse, adult / Similar devices) []  - 0 Simple Staple / Suture removal (25 or less) []  - 0 Complex Staple / Suture removal (26 or more) []  - 0 Hypo / Hyperglycemic Management (close monitor of Blood Glucose) []  - 0 Ankle / Brachial Index (ABI) - do not check if billed separately X- 1 5 Vital Signs Has the patient been seen at the hospital within the last three years: Yes Total Score: 115 Level Of Care: New/Established - Level 3 Electronic Signature(s) Signed: 11/30/2022 5:16:31 PM By: Betha Loa Entered By: Betha Loa on 11/30/2022 15:34:33 -------------------------------------------------------------------------------- Encounter Discharge Information Details Patient Name: Date of Service: Bradley [name] MES A. 11/30/2022 2:45 PM Medical Record Number: [id number] Patient Account Number: [id number] Date of Birth/Sex: Treating RN: August 20, 1945 (77 y.o. [name] Primary Care Dekayla Prestridge: Marisue Ivan Other Clinician: Betha Loa Referring Keiyana Stehr: Treating Tomasita Beevers/Extender: Greta Doom in Treatment: [phone] Encounter Discharge Information Items Discharge Condition:  Stable Ambulatory Status: Wheelchair Discharge Destination: Home Transportation: Other Accompanied By: self Schedule Follow-up Appointment: Yes Clinical Summary of Care: Electronic Signature(s) Signed: 11/30/2022 5:16:31 PM By: Betha Loa Entered By: Betha Loa on 11/30/2022 17:12:56 -------------------------------------------------------------------------------- Lower Extremity Assessment Details Patient Name: Date of Service: Bradley [name] MES A. 11/30/2022 2:45 PM Medical Record Number: [id number] Patient Account Number: [id number] Date of Birth/Sex: Treating RN: Oct 15, 1945 (77 y.o. [name] Primary Care Zarya Lasseigne: Marisue Ivan Other Clinician: Betha Loa Referring Sadik Piascik: Treating Kielan Dreisbach/Extender: Normal, Recinos ([id number]) [id number] Page 4 of 10 Weeks in Treatment: 176 Electronic Signature(s) Signed: 12/01/2022 4:37:47 PM By: Elliot Gurney, BSN, RN, CWS, Kim RN, BSN Entered By: Elliot Gurney, BSN, RN, CWS, Kim on 11/30/2022 15:27:50 -------------------------------------------------------------------------------- Multi Wound Chart Details Patient Name: Date of Service: Bradley [name] MES A. 11/30/2022 2:45 PM Medical Record Number: [id number] Patient Account Number: [id number] Date of Birth/Sex: Treating RN: Jul 15, 1945 (77 y.o. Loel Lofty, Kim Primary Care Lachelle Rissler: Marisue Ivan Other Clinician: Betha Loa Referring Fartun Paradiso: Treating Alson Mcpheeters/Extender: Greta Doom in Treatment: 176 Vital Signs Height(in): Pulse(bpm): 72 Weight(lbs): Blood Pressure(mmHg): 111/67 Body Mass Index(BMI): Temperature(F): 97.8 Respiratory Rate(breaths/min): 18 [10:Photos:] [N/A:N/A] Midline Sacrum Left, Posterior Upper Leg N/A Wound Location: Pressure Injury Shear/Friction N/A Wounding Event: Pressure Ulcer Pressure Ulcer N/A Primary Etiology: Anemia, Hypertension, History of  Anemia, Hypertension, History of N/A Comorbid History: pressure wounds, Rheumatoid Arthritis, pressure wounds, Rheumatoid Arthritis, Paraplegia Paraplegia 06/07/2015 11/05/2022 N/A Date Acquired: 176 2 N/A Weeks of Treatment: Open Open N/A Wound Status: No No N/A Wound Recurrence: No Yes N/A Clustered Wound: N/A 2 N/A Clustered Quantity: 8x3.5x1.8 4.5x3.5x0.2 N/A Measurements L x W x D (cm) 21.991 12.37 N/A A (cm) : rea 39.584 2.474 N/A Volume (cm) : 57.30% -483.20% N/A % Reduction in A rea: 87.20% -11681.00% N/A % Reduction in Volume: 5 Starting Position 1 (o'clock): 10 Ending Position 1 (o'clock): 2.5 Maximum Distance 1 (cm): Yes N/A N/A Undermining: Category/Stage  IV Category/Stage II N/A Classification: Medium Medium N/A Exudate A mount: Serosanguineous Serous N/A Exudate Type: red, brown amber N/A Exudate Color: Epibole Thickened N/A Wound Margin: Large (67-100%) Large (67-100%) N/A Granulation A mount: Red, [name] Red, [name] N/A Granulation Quality: Small (1-33%) None Present (0%) N/A Necrotic A mount: Fat Layer (Subcutaneous Tissue): Yes Fat Layer (Subcutaneous Tissue): Yes N/A Exposed Structures: MIKAEEL, PETROW ([id number]) [id number] Page 5 of 10 Muscle: Yes Fascia: No Fascia: No Tendon: No Tendon: No Muscle: No Joint: No Joint: No Bone: No Bone: No None N/A N/A Epithelialization: Treatment Notes Electronic Signature(s) Signed: 11/30/2022 5:16:31 PM By: Betha Loa Entered By: Betha Loa on 11/30/2022 15:32:45 -------------------------------------------------------------------------------- Multi-Disciplinary Care Plan Details Patient Name: Date of Service: Bradley [name] MES A. 11/30/2022 2:45 PM Medical Record Number: [id number] Patient Account Number: [id number] Date of Birth/Sex: Treating RN: 01-16-1946 (77 y.o. [name] Primary Care Khila Papp: Marisue Ivan Other Clinician: Betha Loa Referring Shadasia Oldfield: Treating Kein Carlberg/Extender: Greta Doom in Treatment: 176 Active Inactive Electronic Signature(s) Signed: 11/30/2022 5:16:31 PM By: Betha Loa Signed: 12/01/2022 4:37:47 PM By: Elliot Gurney BSN, RN, CWS, Kim RN, BSN Entered By: Betha Loa on 11/30/2022 15:34:47 -------------------------------------------------------------------------------- Pain Assessment Details Patient Name: Date of Service: Bradley [name] MES A. 11/30/2022 2:45 PM Medical Record Number: [id number] Patient Account Number: [id number] Date of Birth/Sex: Treating RN: 1946/02/17 (77 y.o. [name] Primary Care Adriell Polansky: Marisue Ivan Other Clinician: Betha Loa Referring Staceyann Knouff: Treating Maijor Hornig/Extender: Greta Doom in Treatment: [phone] Active Problems Location of Pain Severity and Description of Pain Patient Has Paino No Site Locations KOLBI, ALTADONNA A ([id number]) [id number] Page 6 of 10 Pain Management and Medication Current Pain Management: Notes Patient denies pain at this time. Electronic Signature(s) Signed: 12/01/2022 4:37:47 PM By: Elliot Gurney, BSN, RN, CWS, Kim RN, BSN Entered By: Elliot Gurney, BSN, RN, CWS, Kim on 11/30/2022 15:17:01 -------------------------------------------------------------------------------- Patient/Caregiver Education Details Patient Name: Date of Service: Bradley [name] MES A. 6/26/2024andnbsp2:45 PM Medical Record Number: [id number] Patient Account Number: [id number] Date of Birth/Gender: Treating RN: November 26, 1945 (77 y.o. [name] Primary Care Physician: Marisue Ivan Other Clinician: Betha Loa Referring Physician: Treating Physician/Extender: Greta Doom in Treatment: 176 Education Assessment Education Provided To: Patient Education Topics Provided Wound/Skin Impairment: Handouts: Other: continue wound care as  directed Methods: Explain/Verbal Responses: State content correctly Electronic Signature(s) Signed: 11/30/2022 5:16:31 PM By: Betha Loa Entered By: Betha Loa on 11/30/2022 15:35:07 [name] ([id number]) [id number] Page 7 of 10 -------------------------------------------------------------------------------- Wound Assessment Details Patient Name: Date of Service: Bradley [name] MES A. 11/30/2022 2:45 PM Medical Record Number: [id number] Patient Account Number: [id number] Date of Birth/Sex: Treating RN: 09-10-1945 (77 y.o. Loel Lofty, Selena Batten Primary Care Kruti Horacek: Marisue Ivan Other Clinician: Betha Loa Referring Amariona Rathje: Treating Aneli Zara/Extender: Claudie Fisherman Weeks in Treatment: 176 Wound Status Wound Number: 10 Primary Pressure Ulcer Etiology: Wound Location: Midline Sacrum Wound Open Wounding Event: Pressure Injury Status: Date Acquired: 06/07/2015 Comorbid Anemia, Hypertension, History of pressure wounds, Weeks Of Treatment: 176 History: Rheumatoid Arthritis, Paraplegia Clustered Wound: No Photos Wound Measurements Length: (cm) 8 Width: (cm) 3.5 Depth: (cm) 1.8 Area: (cm) 21.991 Volume: (cm) 39.584 % Reduction in Area: 57.3% % Reduction in Volume: 87.2% Epithelialization: None Undermining: Yes Starting Position (o'clock): 5 Ending Position (o'clock): 10 Maximum Distance: (cm) 2.5 Wound Description Classification: Category/Stage IV Wound Margin: Epibole Exudate Amount: Medium Exudate Type: Serosanguineous Exudate Color: red, brown Foul Odor After Cleansing: No Slough/Fibrino Yes Wound Bed Granulation  Amount: Large (67-100%) Exposed Structure Granulation Quality: Red, [name] Fascia Exposed: No Necrotic Amount: Small (1-33%) Fat Layer (Subcutaneous Tissue) Exposed: Yes Necrotic Quality: Adherent Slough Tendon Exposed: No Muscle Exposed: Yes Necrosis of Muscle: No Joint Exposed: No Bone  Exposed: No Treatment Notes Wound #10 (Sacrum) Wound Laterality: Midline RAEKWAN, SPELMAN A ([id number]) [id number] Page 8 of 10 Cleanser Dakin 16 (oz) 0.25 Discharge Instruction: Use as directed. Peri-Wound Care Topical Primary Dressing Gauze Discharge Instruction: As directed: moistened with Dakins Solution Secondary Dressing Zetuvit Plus 4x4 (in/in) Secured With Medipore T - 54M Medipore H Soft Cloth Surgical T ape ape, 2x2 (in/yd) Compression Wrap Compression Stockings Facilities manager) Signed: 12/01/2022 4:37:47 PM By: Elliot Gurney, BSN, RN, CWS, Kim RN, BSN Entered By: Elliot Gurney, BSN, RN, CWS, Kim on 11/30/2022 15:25:59 -------------------------------------------------------------------------------- Wound Assessment Details Patient Name: Date of Service: Bradley [name] MES A. 11/30/2022 2:45 PM Medical Record Number: [id number] Patient Account Number: [id number] Date of Birth/Sex: Treating RN: [birth year] (77 y.o. Loel Lofty, Selena Batten Primary Care Oyuki Hogan: Marisue Ivan Other Clinician: Betha Loa Referring [name] Simmers: Treating Krish Bailly/Extender: Claudie Fisherman Weeks in Treatment: 176 Wound Status Wound Number: 14 Primary Pressure Ulcer Etiology: Wound Location: Left, Posterior Upper Leg Wound Open Wounding Event: Shear/Friction Status: Date Acquired: 11/05/2022 Comorbid Anemia, Hypertension, History of pressure wounds, Weeks Of Treatment: 2 History: Rheumatoid Arthritis, Paraplegia Clustered Wound: Yes Photos Wound Measurements Length: (cm) 4.5 Width: (cm) 3.5 Depth: (cm) 0.2 TADEN, WITTER A ([id number]) Clustered Quantity: 2 Area: (cm) 12.37 Volume: (cm) 2.474 % Reduction in Area: -483.2% % Reduction in Volume: -11681% [id number] Page 9 of 10 Wound Description Classification: Category/Stage II Wound Margin: Thickened Exudate Amount: Medium Exudate Type: Serous Exudate Color:  amber Foul Odor After Cleansing: No Slough/Fibrino No Wound Bed Granulation Amount: Large (67-100%) Exposed Structure Granulation Quality: Red, [name] Fascia Exposed: No Necrotic Amount: None Present (0%) Fat Layer (Subcutaneous Tissue) Exposed: Yes Tendon Exposed: No Muscle Exposed: No Joint Exposed: No Bone Exposed: No Treatment Notes Wound #14 (Upper Leg) Wound Laterality: Left, Posterior Cleanser Peri-Wound Care Topical Primary Dressing Hydrofera Blue Ready Transfer Foam, 2.5x2.5 (in/in) Discharge Instruction: Apply Hydrofera Blue Ready to wound bed as directed Secondary Dressing ABD Pad 5x9 (in/in) Discharge Instruction: Cover with ABD pad Secured With Medipore T - 54M Medipore H Soft Cloth Surgical T ape ape, 2x2 (in/yd) Compression Wrap Compression Stockings Facilities manager) Signed: 12/01/2022 4:37:47 PM By: Elliot Gurney, BSN, RN, CWS, Kim RN, BSN Entered By: Elliot Gurney, BSN, RN, CWS, Kim on 11/30/2022 15:26:56 -------------------------------------------------------------------------------- Vitals Details Patient Name: Date of Service: Bradley [name] MES A. 11/30/2022 2:45 PM Medical Record Number: [id number] Patient Account Number: [id number] Date of Birth/Sex: Treating RN: Oct 07, 1945 (77 y.o. [name] Primary Care Delynn Olvera: Marisue Ivan Other Clinician: Betha Loa Referring Tya Haughey: Treating Shawntavia Saunders/Extender: Claudie Fisherman Weeks in Treatment: 176 Vital Signs Time Taken: 15:02 Temperature (F): 97.8 TYCHO, CHERAMIE A ([id number]) [id number] Page 10 of 10 Pulse (bpm): 72 Respiratory Rate (breaths/min): 18 Blood Pressure (mmHg): 111/67 Reference Range: 80 - 120 mg / dl Electronic Signature(s) Signed: 12/01/2022 4:37:47 PM By: Elliot Gurney, BSN, RN, CWS, Kim RN, BSN Entered By: Elliot Gurney, BSN, RN, CWS, Kim on 11/30/2022 15:16:41

## 2022-12-02 DIAGNOSIS — L89209 Pressure ulcer of unspecified hip, unspecified stage: Secondary | ICD-10-CM | POA: Diagnosis not present

## 2022-12-07 ENCOUNTER — Encounter: Payer: Medicare HMO | Attending: Internal Medicine | Admitting: Internal Medicine

## 2022-12-07 DIAGNOSIS — L89154 Pressure ulcer of sacral region, stage 4: Secondary | ICD-10-CM | POA: Diagnosis not present

## 2022-12-07 DIAGNOSIS — S81802A Unspecified open wound, left lower leg, initial encounter: Secondary | ICD-10-CM

## 2022-12-07 DIAGNOSIS — I10 Essential (primary) hypertension: Secondary | ICD-10-CM | POA: Insufficient documentation

## 2022-12-07 DIAGNOSIS — Z933 Colostomy status: Secondary | ICD-10-CM | POA: Diagnosis not present

## 2022-12-07 DIAGNOSIS — M8668 Other chronic osteomyelitis, other site: Secondary | ICD-10-CM | POA: Diagnosis not present

## 2022-12-07 DIAGNOSIS — G8220 Paraplegia, unspecified: Secondary | ICD-10-CM | POA: Insufficient documentation

## 2022-12-07 DIAGNOSIS — D509 Iron deficiency anemia, unspecified: Secondary | ICD-10-CM | POA: Insufficient documentation

## 2022-12-07 DIAGNOSIS — M069 Rheumatoid arthritis, unspecified: Secondary | ICD-10-CM | POA: Insufficient documentation

## 2022-12-07 DIAGNOSIS — L97129 Non-pressure chronic ulcer of left thigh with unspecified severity: Secondary | ICD-10-CM | POA: Insufficient documentation

## 2022-12-07 DIAGNOSIS — L89892 Pressure ulcer of other site, stage 2: Secondary | ICD-10-CM | POA: Insufficient documentation

## 2022-12-07 DIAGNOSIS — L97111 Non-pressure chronic ulcer of right thigh limited to breakdown of skin: Secondary | ICD-10-CM | POA: Insufficient documentation

## 2022-12-07 DIAGNOSIS — L89153 Pressure ulcer of sacral region, stage 3: Secondary | ICD-10-CM | POA: Diagnosis not present

## 2022-12-07 NOTE — Progress Notes (Signed)
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 skin graft to the area. The patient has questions about whether he will be able to manage on his own these to be keeping off the graft site. He tells me he had some sort of fall when he went to [facility]. He apparently traumatized the wound and it is really significantly larger today but without evidence of infection.  Roughly 2 cm wider and precariously close now to his perianal area and some aspects. 03/02/16; we have not seen this patient in 5 months. He is been followed by plastic surgery at [facility]. The last note from plastic surgery I see was dated 12/15/15. He underwent some form of tissue graft on 09/24/15. This did not the do very well. According the patient is not felt that he could easily undergo additional plastic surgery secondary to the wounds close proximity to the anus. Apparently the patient was offered a diverting colostomy at one point. In any case he is only been using wet to dry dressings surprisingly changing this himself at home using a mirror. He does not have home health. He does have a level II pressure-relief surface as well as a Roho cushion for his wheelchair. In spite of this the wound is considerably larger one than when he was last in the clinic currently measuring 12.5 x 7. There is also an area  superiorly in the wound that tunnels more deeply. Clearly a stage III wound 03/15/16 patient presents today for reevaluation concerning his midline sacral pressure ulcer. This again is an extensive ulcer which does not extend to bone fortunately but is sufficiently large to make healing of this wound difficult. Again he has been seen at [facility] where apparently they did discuss with him the possibility of a diverting colostomy but he did not want any part of that. Subsequently he has not followed up there currently. He continues overall to do fairly well all things considered with this wound. He is currently utilizing Medihoney Santyl would be extremely expensive for the amount he would need and likely cost prohibitive. 03/29/16; we'll follow this patient on an every two-week basis. He has a fairly substantial stage 3 pressure ulcer over his lower sacrum and coccyx and extending into his bilateral gluteal areas left greater than right. He now has home health. [facility] think [facility]. He is  applying Medihoney, kerlix and border foam. He arrives today with the intake nurse reporting a large amount of drainage. The patient stated he put his dressing on it 7:00 this morning by the time he arrived here at 10 there was already a moderate to a large amount of drainage. I once again reviewed his history. He had an attempted closure with myocutaneous flap earlier this year at [facility]. This did not go well. He was offered a diverting colostomy but refused. He is not a candidate for a wound VAC as the actual wound is precariously close to his anal opening. As mentioned he does have [facility] but miraculously this patient who is a paraplegic is actually changing the dressings himself. 04/12/16 patient presents today for a follow-up of his essentially large sacral pressure ulcer stage III. Nothing has changed dramatically since I last saw him about one month ago. He has seen Dr. Leanord Hawking once the interim. With that being said patient's wound appears somewhat less macerated today compared to NOUR, THEEL ([id number]) [id number] Page 9 of 13 previous evaluations. He still has no pain being a quadriplegic. 04-26-16 Mr. Buntain returns today for a violation of his stage III sacral pressure ulcer he denies any complaints concerns or issues over the past 2 weeks. He missed to changing dressing twice daily due to drainage although he states this is not an increase in drainage over the past 2 weeks. He does change his dressings independently. He admits to sitting in his motorized chair for no more than 2-3 hours at which time he transfers to bed and rotates lateral position. 05/10/16; Labib Bujnowski returns today for review of his stage III sacral pressure ulcer. He denies any concerns over the last 2 weeks although he seems to be running out of Aquacel Ag and on those days he uses Medihoney. He has [facility] was supplying his dressings. He still complains of drainage.  He does his dressings independently. He has in his motorized chair for 2-3 hours that time other than that he offloads this. Dimensions of the wound are down 1 cm in both directions. He underwent an aggressive debridement on his last visit of thick circumferential skin and subcutaneous tissue. It is possible at some point in the future he is going to need this done again 05/24/16; the patient returns today for review of his stage III sacral pressure ulcer. We have been using Aquacel Ag he tells me that he changes this up to twice a day. I'm not really  certain of the reason for this frequency of changing. He has some involvement from the home health nurses but I think is doing most of the changing himself which I think because of his paraplegia would be a very difficult exercise. Nevertheless he states that there is "wetness". I am not sure if there is another dressing that we could easily changed that much. I'd wanted to change to [facility] but I'll need to have a sense of how frequent he would need to change this. 06/14/16; this is a patient returns for review of his stage III sacral pressure ulcer. We have been using Aquacel Ag and over the last 2 visits he has had extensive debridement so of the thick circumferential skin and subcutaneous tissue that surrounds this wound. In spite of this really absolutely no change in the condition of the wound warrants measurements. We have [facility] home health I believe changing the dressing on 3 occasions the patient states he does this on one occasion himself 06/28/16; this is a patient who has a fairly large stage III sacral pressure ulcer. I changed him to [facility] from Aquacel 2 weeks ago. He returns today in follow-up. In the meantime a nurse from [facility] has calledrequesting ordering of a wound VAC. He had this discussion before. The problem is the proximity of the lowest edge of this wound to the patient's anal opening roughly 3/4 of an  inch. Can't see how this can be arranged. Apparently the nurse who is calling has a lot of experience, the question would be then when she is not available would be doing this. I would not have thought that this wound is not amenable to a wound VAC because of this reason 07/12/16; the patient comes in today and I have signed orders for a wound VAC. The home health team through [facility] is convinced that he can benefit from this even though there is close proximity to his anal opening beneath the gluteal clefts. The patient does not have a bowel regimen but states he has a bowel movement every 2 days this will also provide some problem with regards to the vac seal 07/26/16; the patient never did obtain a [place] wound VAC as he could not afford the $200 per month co-pay we have been using Hydrofera Blue now for 6 weeks or so. No major change in this wound at all. He is still not interested in the concept of plastic surgery. There changing the dressing every second day 08/09/16; the patient arrives with a wound precisely in the same situation. In keeping with the plan I outlined last time extensive debridement with an open curet the surface of this is not completely viable. Still has some degree of surrounding thick skin and subcutaneous tissue. No evidence of infection. Once again I have had a conversation with him about plastic surgery, he is simply not interested. 08/23/16; wound is really no different. Thick circumferential skin and subcutaneous tissue around the wound edge which is a lot better from debridement we did earlier in the year. The surface of the wound looks viable however with a curet there is definitely a gritty surface to this. We use Medihoney for a while, he could not afford Santyl. I don't think we could get a supply of Iodoflex. He talks a little more positively about the concept of plastic surgery which I've gone over with him today 08/31/16;; patient arrives in clinic today with the  wound surface really no different there is no changes  in dimensions. I debrided today surface on the left upper side of this wound aggressively week ago there is no real change here no evidence of epithelialization. The problem with debridement in the clinic is that he believes from this very liberally. We have been using Sorbact. 09/21/16; absolutely no change in the appearance or measurements of this wound. More recently I've been debrided in this aggressively and using sorbact to see if we could get to a better wound surface. Although this visually looks satisfactory, debridement reveals a very gritty surface to this. However even with this debridement and removal of thick nonviable skin and subcutaneous tissue from around the large amount of the circumference of this wound we have made absolutely no progress. This may be an offloading issue I'm just not completely certain. It has 2 close proximity in its inferior aspect to consider negative pressure therapy 10/26/16; READMISSION This patient called our clinic yesterday to report an odor in his wound. He had been to see plastic surgery at [facility] at our request after his last visit on 09/21/16; we have been seeing him for several months with a large stage III wound. He had been sent to general surgery for consideration of a colostomy, that appointment was not until mid June He comes in today with a temperature of 101. He is reporting an odor in the wound since last weekend. 01/10/17 Readmission: 01/10/17 On evaluation today it is noted that patient has been seen by plastic surgery at [facility] since he was last evaluated here. They did discuss with him the possibility of a flap according to the notes but unfortunately at this point he was not quite ready to proceed with surgery and instead wanted to give the Wound VAC a try. In the hospital they were able to get a good seal on the Wound VAC. Unfortunately since that time they have been having trouble in regard to  his current home health company keeping a simple on the Wound VAC. He would like to switch to a different home health company. With that being said it sounds as if the problem is that his wound VAC is not feeling at the lower portion of his back and he tells me that he can take some of the clear plastic and put over that area when the sill breaks and it will correct it for time. He has no discomfort or pain which is good news. He has been treated with IV vancomycin since he was last seen here and has an appointment with a infectious disease specialist in two days on 01/12/17. Otherwise he was transferred back to [place] for continuing to monitor and manage is wound as she progresses with a Wound VAC for the time being. 01/17/17 on evaluation today patient continues to show evidence of slight improvement with the Wound VAC fortunately there's no evidence of infection or otherwise worsening condition in general. Nonetheless we were unable to get him switch to [facility] in regard to home help from his current company. I'm not sure the reasoning behind but for some reason he was not accepted as a patient with him. Continue to apply the Wound VAC which does still show that some maceration around the wound edges but the wound measurements were slightly improved. No fevers, chills, nausea, or vomiting noted at this time. 02/14/17; this patient I have not seen in 5 months although he has been readmitted to our clinic seen by our physician assistant Allen Derry twice in early August. I have looked  through [facility] notes care everywhere. The patient saw plastic surgery in May [Dr. Bhatt}. The patient was sent to general surgery and ultimately had a colostomy placed. On 11/29/16. This was after he was admitted to [facility] sometime in May. An MRI of the pelvis on 5/23 showed osteomyelitis of the coccyx. An attempt was made to drain fluid that was not successful. He was treated with empiric broad-spectrum antibiotics  VAC/cefepime/Flagyl starting on 11/02/16 with plans for a 6 week course. According to their notes he was sent to a nursing home. Was last seen by Dr. Almedia Balls of plastic surgery on 12/28/16. The first part of the note is a long dissertation about the difficulties finding adequate patients for flap closure of pressure ulcers. At that time the wound was noted to be stage IV based I think on underlying infection no exposed bone and healthy granulation tissue. 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 He was last seen by infectious disease 01/12/17 A Dr. Annye Asa. His note says that Mr. Armijo was not interested in a flap closure for referring a trial of the wound VAC. As previously anticipated the wound VAC could not be maintained as an outpatient in the community. He is now using something similar to a Dakin's wet to dry recommended by [facility] VASHE solution. He is placing this twice a day himself. This is almost s hopeless setting in terms of heeling 02/28/17; he is using a Dakin's wet to dry. Most of the wound surface looks satisfactory however the deeper area over his coccyx now has exposed bone I'm not sure if I noted this last week. 03/21/17; patient is usingVASHE solution wet to dry which I gather is a variation on Dakin solution. He has home health changing this 3 times a week the other days he does this himself. His appointment with plastic surgery 04/18/17; patient continues to use a variant of Dakin solution I believe. His wound continues to have a clean viable surface. The 2 areas of exposed bone in the center of this wound had closed over. He has an appointment with plastic surgery on December 5 at which time I hope that there'll be a plan for myocutaneous flap closure In looking through [facility] link I couldn't find any more plastic surgery appointments. I did come across the fact that he is been followed by hematology MARQUIES, MIMMS ([id number])  [id number] Page 10 of 13 for a microcytic hypochromic anemia. He had a reasonably normal looking hemoglobin electrophoresis. His iron level was 10 and according to the patient he is going for IV iron infusions starting tomorrow. He had a sedimentation rate of 74. More problematically from a pure wound care point of view his albumin was 2.7 earlier this month 05/17/17; this is a patient I follow monthly. He has a large now stage IV wound over his bilateral buttocks with close proximity to his anal opening. More recently he has developed a large area with exposed bone in the center of this probably secondary to the underlying osteomyelitis E had in the summer. He also follows with Dr. Almedia Balls at [facility] who is plastic surgery. He had an appointment earlier this month and according to the patient Dr. Almedia Balls does not want to proceed with any attempted flap closure. Although I do not have current access to her note in care everywhere this is likely due to exposed bone. Again according to the patient they did a bone biopsy. He is still using a  variant of Dakin solution changing twice a day. He has home health. The patient is not able to give me a firm answer about how long he spends on this in his wheelchair The patient also states that Dr. Almedia Balls wanted to reconsider a wound VAC. I really don't see this as a viable option at least not in the outpatient setting. The wound itself is frankly to close to his anal opening to maintain a seal. The last time we tried to do this home health was unable to manage it. It might be possible to maintain a wound VAC in this setting outside of the home such as a skilled nursing facility or an LTAC however I am doubtful about this even in that setting **** READMISSION 09/21/17-He is here for evaluation of stage IV sacral ulcer. Since his last evaluation here in December he has completed treatment for sacral osteomyelitis. He was at [facility]  health[facility] for IV therapy and NPWT dressing changes. He was discharged, with home health services, in February. He admits that while in the skilled facility he had "80%" success with maintaining dressing, since discharge he has had approximately "40%" success with maintaining wound VAC dressing. We discussed at length that this is not a safe or recommended option. We will apply Dakin's wet to dry dressing daily and he will follow-up next week. He is accompanied today by his sister who is willing to assist in dressing changes; they will discuss the social issue as he feels he is capable of changing dressing daily when home health is not able. 09/28/17-He is here in follow-up evaluation for stage IV sacral pressure ulcer. He has been using the Dakin's wet-to-dry daily; he continues with home health. He is not accompanied by anyone at this visit. He will follow up in two weeks per his request/preference. 10/12/17 on evaluation today patient appears to be doing very well. The Dakin solution went to dry packings do seem to be helping him as far as the sacral wound is concerned I'm not seeing anything that has me more concerned as far as infection or otherwise is concerned. Overall I'm pleased with the appearance of the wound. 10/26/17-He is here in follow up evaluation for a stage IV sacral ulcer. He continues with daily Dakin's wet-to-dry. He is voicing no complaints or concerns. He will follow-up in 2 weeks 11/16/17-He is here for follow up evaluation for a palliative stage IV sacral pressure ulcer. We will continue with Dakin's wet-to-dry. He will follow-up in 4 weeks. He is expressing concern/complaint regarding new bed that has arrived, stating he is unable to manipulate/maneuver it due to the bed crank being at the foot of the bed. 12/14/17-He is here for evaluation for palliative stage IV sacral ulcer. He is voicing no complaints or concerns. We will continue with one-to-one ratio of saline and dakins.  He will follow-up in 3 weeks 01/04/18-He is here in follow-up evaluation for palliative stage IV sacral ulcer. He is inquiring about reinstating the negative pressure wound therapy. We discussed at length that the negative pressure wound therapy in the home setting has not been successful for him repeatedly with loss of cereal and unavailability of 24/7 help; reminded him that home health is not available 24/7 when loss of seal occurs. He does verbalize understanding to this and does not pursue. We also discussed the palliative nature of this ulcer (given no significant change/improvement in measurement/appearance, not a candidate for muscle flap per plastic surgery, and continued independent living) and that the  goal is for maintenance, decrease in infection and minimizing/avoiding deterioration given that he is independent in his care, does not have home health and requires daily dressing changes secondary to drainage amount. He is inquiring about a wound clinic in [facility], I have informed him that I am unfamiliar with that clinic but that he is encouraged to seek another opinion if that is his desire. We will continue with dakins and he will follow up in three weeks 01/25/18-He is here in follow-up evaluation for palliative stage IV sacral ulcer. He continues with Dakin's/saline 1:1 mixture wet to dry dressing changes. He states he has an appointment at [facility] on 9/17 for evaluation of surgical intervention/closure of the sacral ulcer. He will follow-up here in 4 weeks Readmission: 07/16/2019 upon evaluation today patient appears to be doing really about the same as when he was previously seen here in the wound care center. He most recently was a patient of [name] back when she was still working here in the center but had been referred to [facility] for consideration of a flap. With that being said the surgeon there at [facility] stated that this was too large for her flap and they have been attempting to get this smaller in  order to be able to proceed with a flap. Nonetheless unfortunately he has had a cycle of going back and forth between the osteomyelitis flaring and then sent him back and then making a little bit of progress only to be sent back again. It sounds like most recently they have been using a Iodoflex type dressing at this point which does not seem to have done any harm by any means. With that being said this wound seems to be quite large for using Iodoflex throughout and subsequently I think he may do much better with the use of Vioxx moistened gauze which would be safe for the new tissue growing and also keep the wound quite nice and clean. The patient is not opposed to this he in fact states that his home health nurse had mentioned this as a possibility as well. 07/23/2019 upon evaluation today patient actually appears to be doing very well in regard to his sacral wound. In fact this is much healthier the measurements not terribly different but again with a wound like this at home necessarily expect a a lot of change as far as the overall measurements are concerned in just 1 week's time. This is going be a much longer term process at this point. With that being said I do think that he is very healthy appearing as far as the base of the wound is concerned. 08/06/2019 upon evaluation today patient actually appears to be doing about the same with regard to his wound to be honest. There is really not a significant improvement overall based on what I am seeing today. Fortunately there is no signs of significant systemic infection. No fevers, chills, nausea, vomiting, or diarrhea. With that being said there is odor to the drainage from the wound and subsequently also what appears to be increased drainage based on what we are seeing today as well as what his home health nurse called [place] about that she was concerned with as well. 08/13/2019 upon evaluation today patient appears to be doing about the same at this point  with regard to his wound although I think the dressing may be a little less drainage wise compared to what it was previous. Fortunately there is no signs of active infection at this time. No  fevers, chills, nausea, vomiting, or diarrhea. He has been taking the antibiotic for only 3 days. 08/23/2019 upon evaluation today patient appears to be doing not nearly as well with regard to his wound. In general really not making a lot of progress which is unfortunate. There does not appear to be any signs of active infection at this time. No fevers, chills, nausea, vomiting, or diarrhea. With that being said the patient unfortunately does seem to be experiencing continued epiboly around the edges of the wound as well as significant scar tissue he has been in the majority of his day sitting in his chair which is likely a big reason for all of this. Fortunately there is no signs of active infection at this time. No fevers, chills, nausea, vomiting, or diarrhea. 08/29/2019 upon evaluation today patient's wound actually appears to be showing some signs of improvement. The region of few areas of new skin growth around the edges of the wound even where he has some of the epiboly. This is actually good news and I think that he has been very aggressive and offloading over the past week since we showed him the pictures of his wound last week. There still may be a chance that he is going require some sharp debridement to clear away some of these edges but to be honest I think that is can be quite an undertaking. The main reason is that he has a lot of thickened scar tissue and it is good to bleed quite significantly in my opinion. The I think if organ to do that we may even need to have a portable or disposable cautery in order to make sure that we are able to get the area completely sealed up as far as bleeding is concerned. I do have one that we can utilize. However being that the wound looks so much better right now  would like to give this 2 weeks to see how things stand and look at that point before making any additional recommendations. 09/12/2019 upon evaluation today patient appears to be making some progress as far as offloading is concerned. He still has a substantial wound but nonetheless he tells me he is off loading much more aggressively at this point. This is obviously good news. No fevers, chills, nausea, vomiting, or diarrhea. 4/22; I have not seen this patient in quite some time. No major change in the condition of the wound or its wound volume. Surrounding maceration of the skin around the edges of the wound. The wound is fairly substantial. T close to the anal opening to consider a wound VAC. He had extensive trials of plastic o surgery at [facility] which really does not result in any improvement. His wound bed is however clean DELAN, CANJURA ([id number]) [id number] Page 11 of 13 10/10/2019 upon evaluation today patient appears to be doing well with regard to the wound in the sacral region. He has been tolerating the dressing changes without complication. Fortunately there is no signs of infection and he actually has some epithelial growth noted as well. He tells me has been trying to continue to offload this area which is excellent that is what he needs to do most as far as what he can have in his control. Otherwise I feel like the collagen with a saline moistened gauze behind has been beneficial for him. 5/20; collagen and normal saline wet-to-dry. Although the tissue when this large sacral wound looks reasonable there is been absolutely no benefit to the overall  closure. He has macerated skin around this. He tells me that he spends a large part of the day up in the wheelchair. He still has home health 11/07/2019 upon evaluation today patient appears to be doing well with regard to his wounds at this point. Fortunately there is no signs of active infection at  this time. Overall I feel like his sacral wound in general appears to be doing quite nicely. I am very pleased with overall how things are progressing and I feel like the patient is making excellent progress towards resolution. With that being said he still has a long ways to go before we expect to see this completely healed. 11/21/2019 upon evaluation today patient appears to be doing about the same in regard to his sacral wound. Unfortunately he is really not making much in the way of improvement when I questioned him about how much time he is really spending sitting on his bottom he tells me that the majority of his day is mainly in that position though he does spend some time in bed. It does not sound like it is enough however neither does it look like it is enough. 12/24/2019 upon evaluation today patient appears to be doing really about the same there is no significant improvement overall in his wound bed. Last time he was seen here we did have a discussion about the possibility of seeing if I can get a surgeon to evaluate and potentially clear away some of the thickened edges of the wound in order to allow this wound to potentially have a chance of closing with a wound VAC. Right now we could not even get a wound VAC to seal with some of the rolled and thickened edges. With that being said elected to the patient to think about this to discuss today. At this point the patient does want to see if I could make the referral to potentially see if the surgeon would be able to perform this and subsequently he agrees to go to a skilled nursing facility following if they are able to do this in order to allow for a wound VAC and to get this wound to heal more effectively and quickly. In my opinion the only way that I would recommend that he see the surgeon to clear away the edges would be if he is also in agreement with going to skilled nursing facility as I know he cannot appropriately offload at home to  take care of this and that setting. 01/07/2020 on evaluation today patient appears to be doing about the same in regard to his wound. There is no signs of active infection at this time which is great news. All in all he is really doing about the same. We have made referral to surgery to see if they can potentially perform debridement to clear away some of the scarred edging of the wound so we could potentially see about a wound VAC to try to help this heal more efficiently. Again we have discussed that if this occurs he would be in a skilled nursing facility following or release would need to be in order to appropriately offload. Nonetheless we are still in the process of working that referral. 01/21/2020 upon evaluation today patient appears to be doing about the same in regard to his sacral wound. He did see Dr. Tonna Boehringer Dr. Geoffery Lyons feeling was that there was not anything surgically he could offer at this point as he did not feel like that a wound VAC would  likely be appropriate for the patient. With that being said he did recommend potential for trying to get the patient into a skilled nursing facility as an outpatient and they will get me working on that as well. Fortunately there is no signs of active infection at this time. 11/10; this is a patient that has not been seen here in quite a bit of time while we were in our alternative venue. As well I have not seen this patient in probably over 2 years. As I remember things with him he has had this pressure ulcer encompassing his lower sacrum and coccyx and the surrounding buttocks for a prolonged period of time. When he first came here the surface looked fairly good and the wound was smaller we attempted a wound VAC but we could never keep this in place. Because of nonresponse of the wound to topical dressings we referred him to [facility] plastic surgery where preparation was being made for flap closure. Intact he had a colostomy however after that  he was deemed to have 2 large wound for flap closure. He did have osteomyelitis in the coccyx area in 2019. He is using wet-to-dry he has home health. They are managing his ostomy supplies as well. The wound is substantially larger than what I remember. Versus last time he was here to wound months or so ago he has an unstageable wound on the left upper thigh posteriorly. He tells me he is in his chair quite a bit especially recently 12/1; patient comes back to clinic. He did not get the lab work done apparently home health would not do this they referred him to the lab where he is apparently having a CBC checked because he is on iron infusions. We will try to get the lab work I ordered done there. He also did not get the x-ray he says he does not remember the discussion. The issue is that this large wound has probing areas to bone. He may have underlying osteomyelitis again he was treated for this previously I think in 2019 12/15; the patient did have lab work done. Notable for iron deficiency anemia with a hemoglobin of 9.7 although he is following with hematology for this his CRP was 7.1 which is up from his previous value. sedimentation rate of 91 which is also higher. However he maintains a relatively high sedimentation rate. Most disturbingly was an albumin of 2.8 although this is also somewhat higher than the 2.2 we had previously. With some difficulty I think a home health nurse actually made him some Dakin's solution he is using a Dakin's wet-to-dry to the large sacral wound. He has the area on the left upper thigh as well 06/17/2020; I follow this patient on a monthly basis. 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 At one point he had underlying osteomyelitis here and he received antibiotic therapy. I believe this was done through [facility] We have been using palliative Dakin's solution wet-to-dry on this which is really This very large wound about the same with healthy  surface. No active infection that is obvious. He has developed an area on the left posterior thigh and now a mirror image area on the right posterior thigh noted pressure injuries from the wheelchair. This occurs even though he has a Roho cushion which is apparently new I have been forwarded lab work from 05/15/2020. At that point his hemoglobin was 9.2 down from 13.8 on 10/5 this is microcytic hypochromic.  He has known iron deficiency although the exact cause of this is never really been clear to me. He follows with hematology oncology and has been receiving IV iron he is supposed to follow-up in mid January and I told the patient this. From wound care point of view his albumin is 2.9 sedimentation rate is 74. His sedimentation rate has always been elevated in this range however his C- reactive protein was 82 which is way above anything previously measured. I see that on 05/08/2020 this was 7.1 I am not sure why the rapid discrepancy. He is supposed to have an x-ray of his lower sacrum looking for evidence of osteomyelitis that he was previously treated for. Is possible he would also require more advanced imaging such as another C CT or MRI He comes in also with paperwork for a Clinitron air-fluidized bed with a trapeze bar. I asked him if he would be able to transfer in and out of this bed . I am not opposed to ordering it, but I do not want to make his transfers more difficult than they already are. He has a Nurse, adult 07/15/2020; he is managed to get his Clinitron air-fluidized bed. He has a new electric wheelchair with a wheelchair cushion. The x-ray I did showed some sacral bone destruction although this did not seem progressive him last time. He was treated for osteomyelitis in this area previously in 2018. His sedimentation rate is 91 CRP 7.1 albumin 2.8. We are using Dakin's wet-to-dry. He has [facility] home health 4/6; 47-month follow-up. He has a Clinitron air-fluidized bed. He has the  wheelchair cushion. His major wound on the coccyx looks somewhat better including including both of [place] my nurse and my feeling that it is closed in somewhat. Wound is still too close to his anal opening to consider a wound VAC. At 1 point we were trying to get him into [facility] to get a flap closure but for 1 reason or another this never moved forward I was never really certain what the issue was. He has an area on the left upper thigh in the gluteal fold. This is a clean wound. 6/1; 19-month follow-up. I follow this man on a palliative basis he has a chronic stage IV wound over the lower sacrum coccyx and surrounding buttocks. He has an air-fluidized bed and a wheelchair cushion but he spends more time in the wheelchair than he does off this area by his own admission. I do not think the area is really changed that much although it is probably deeper in 1 section. He does tell us that he was discharged from home health because of lack of change of the wound. This was [employer]. He was treated for underlying osteomyelitis several years ago and he may have underlying chronic osteomyelitis. I do not believe he ever got the x-ray I ordered REGIS, MANSOURI ([id number]) [id number] Page 12 of 13 8/3; patient presents for his 72-month follow-up. He reports no issues or complaints today. He continues to use Dakin's wet-to-dry dressings to his sacrum and silver alginate to his left lateral thigh wound. He denies signs of infection. 10/5; patient presents for 3-month follow-up. He has no issues or complaints today. He continues to use Dakin's wet-to-dry dressings to his sacrum. He states that the left lateral thigh wound is healed. He denies signs of infection. 12/7; patient presents for 37-month follow-up. He states he would like to obtain a new air loss mattress bed. He states his current one  is having technical difficulties. He currently is using Dakin's wet-to-dry dressings. He  denies signs of infection. 2/1; patient presents for follow-up. He has no issues or complaints today. He currently uses Dakin's wet-to-dry dressings. He denies signs of infection. He has his new air loss mattress. 4/5; patient presents for follow-up. He has been using Dakin's wet-to-dry dressings. He has no issues or complaints today. He denies signs of infection. 11-10-2021 upon evaluation today patient's wound is doing better since I last saw him although it has been quite sometime since then to be perfectly honest. In fact I think it may have been even back in 2022 if I am not mistaken. Either way he has been seeing Dr. Mikey Bussing roughly every 2 months per his request. His wound still though smaller does have a significant amount of thick scar tissue surrounding the edge which is making it very difficult to heal to be honest. His son is performing the dressing changes at home currently. 8/23; patient presents for follow-up. He has no issues or complaints today. He has been doing Dakin's wet-to-dry dressings. He is not interested in any further surgical intervention for his sacral wound. He denies signs of infection. 11/15; Patient presents for follow up. He has been using Dakins wet to dry dressings. He has no issues or complaints. 2/7; patient presents for his regular 35-month follow-up. He continues to use Dakin's wet-to-dry dressings. He has no issues or complaints today. 4/10; patient presents for follow-up. He has been using Dakin's wet-to-dry dressings. He has no issues or complaints. He denies signs of infection. 6/12; patient presents for follow-up. He has been using Dakin's wet-to-dry dressings to the sacral wound. He has a small area of skin breakdown to the left posterior leg and has been keeping this covered. He has no issues or complaints today. He denies signs of infection. 6/26; patient presents for follow-up. He has been using Dakin's wet-to-dry dressings to the sacral wound he now has  another area of skin breakdown just above the previous left posterior leg wound. Appears to be from friction. He has been using Hydrofera Blue to these wounds. Insurance is asking patient to be seen weekly in order for him to keep his air mattress. After 5 weeks he can go out 2 months per patient Report. 7/3; patient presents for follow-up. He has been using Dakin's wet-to-dry dressings to the sacral wound and Hydrofera Blue to the left posterior leg wound. Objective Constitutional Vitals Time Taken: 3:06 PM, Temperature: 98.0 F, Pulse: 74 bpm, Respiratory Rate: 16 breaths/min, Blood Pressure: 97/78 mmHg. General Notes: Sacral ulcer: Granulation tissue throughout. Not all areas of the wound bed appears healthy. No probing to bone. No obvious signs of infection.Open wounds to the posterior left leg with granulation tissue and nonviable tissue. No signs of infection. Integumentary (Hair, Skin) Wound #10 status is Open. Original cause of wound was Pressure Injury. The date acquired was: 06/07/2015. The wound has been in treatment 177 weeks. The wound is located on the Midline Sacrum. The wound measures 8cm length x 3.5cm width x 2cm depth; 21.991cm^2 area and 43.982cm^3 volume. There is muscle and Fat Layer (Subcutaneous Tissue) exposed. There is a medium amount of serosanguineous drainage noted. The wound margin is epibole. There is large (67-100%) red, [name] granulation within the wound bed. There is a small (1-33%) amount of necrotic tissue within the wound bed including Adherent Slough. Wound #14 status is Open. Original cause of wound was Shear/Friction. The date acquired was: 11/05/2022. The wound  has been in treatment 3 weeks. The wound is located on the Left,Posterior Upper Leg. The wound measures 3.4cm length x 4cm width x 0.4cm depth; 10.681cm^2 area and 4.273cm^3 volume. There is Fat Layer (Subcutaneous Tissue) exposed. There is a medium amount of serous drainage noted. The wound margin is  thickened. There is large (67- 100%) red, [name] granulation within the wound bed. There is no necrotic tissue within the wound bed. Assessment Active Problems ICD-10 Pressure ulcer of sacral region, stage 4 Pressure ulcer of other site, stage 2 Unspecified open wound, left lower leg, initial encounter Other chronic osteomyelitis, other site Paraplegia, unspecified Sacral wound is stable. I recommended continuing Dakin's wet-to-dry dressings. The left posterior leg wounds are larger. I advised aggressive offloading so that this does not worsen. For now I recommended continuing Hydrofera Blue but adding Medihoney to help with further debridement. Follow-up in 1 week. KURTISS, COOVERT ([id number]) [id number] Page 13 of 13 Plan 1. Medihoney and Hydrofera Blue to the left posterior legs 2. Dakin's wet-to-dry to the sacral wound 3. Aggressive offloading 4. Follow-up in 1 week Electronic Signature(s) Signed: 12/07/2022 4:13:50 PM By: Geralyn Corwin DO Entered By: Geralyn Corwin on 12/07/2022 15:45:47 -------------------------------------------------------------------------------- SuperBill Details Patient Name: Date of Service: Bradley [name] MES A. 12/07/2022 Medical Record Number: [id number] Patient Account Number: [id number] Date of Birth/Sex: Treating RN: [birth year] (77 y.o. [name] Primary Care Provider: Marisue Ivan Other Clinician: Betha Loa Referring Provider: Treating Provider/Extender: Greta Doom in Treatment: 177 Diagnosis Coding ICD-10 Codes Code Description L89.154 Pressure ulcer of sacral region, stage 4 L89.892 Pressure ulcer of other site, stage 2 S81.802A Unspecified open wound, left lower leg, initial encounter M86.68 Other chronic osteomyelitis, other site G82.20 Paraplegia, unspecified Facility Procedures : CPT4 Code: [id number] Description: 99214 - WOUND CARE VISIT-LEV 4 EST  PT Modifier: Quantity: 1 Physician Procedures : CPT4 Code Description Modifier [id number] - WC PHYS LEVEL 3 - EST PT ICD-10 Diagnosis Description L89.154 Pressure ulcer of sacral region, stage 4 L89.892 Pressure ulcer of other site, stage 2 S81.802A Unspecified open wound, left lower leg, initial  encounter M86.68 Other chronic osteomyelitis, other site Quantity: 1 Electronic Signature(s) Signed: 12/07/2022 4:13:50 PM By: Geralyn Corwin DO Signed: 12/07/2022 4:57:17 PM By: Betha Loa Entered By: Betha Loa on 12/07/2022 16:01:53

## 2022-12-14 ENCOUNTER — Encounter (HOSPITAL_BASED_OUTPATIENT_CLINIC_OR_DEPARTMENT_OTHER): Payer: Medicare HMO | Admitting: Internal Medicine

## 2022-12-14 DIAGNOSIS — D509 Iron deficiency anemia, unspecified: Secondary | ICD-10-CM | POA: Diagnosis not present

## 2022-12-14 DIAGNOSIS — S81802A Unspecified open wound, left lower leg, initial encounter: Secondary | ICD-10-CM

## 2022-12-14 DIAGNOSIS — L89892 Pressure ulcer of other site, stage 2: Secondary | ICD-10-CM | POA: Diagnosis not present

## 2022-12-14 DIAGNOSIS — L97129 Non-pressure chronic ulcer of left thigh with unspecified severity: Secondary | ICD-10-CM | POA: Diagnosis not present

## 2022-12-14 DIAGNOSIS — L97111 Non-pressure chronic ulcer of right thigh limited to breakdown of skin: Secondary | ICD-10-CM | POA: Diagnosis not present

## 2022-12-14 DIAGNOSIS — L89153 Pressure ulcer of sacral region, stage 3: Secondary | ICD-10-CM | POA: Diagnosis not present

## 2022-12-14 DIAGNOSIS — G8220 Paraplegia, unspecified: Secondary | ICD-10-CM | POA: Diagnosis not present

## 2022-12-14 DIAGNOSIS — I10 Essential (primary) hypertension: Secondary | ICD-10-CM | POA: Diagnosis not present

## 2022-12-14 DIAGNOSIS — M069 Rheumatoid arthritis, unspecified: Secondary | ICD-10-CM | POA: Diagnosis not present

## 2022-12-14 DIAGNOSIS — M8668 Other chronic osteomyelitis, other site: Secondary | ICD-10-CM | POA: Diagnosis not present

## 2022-12-14 DIAGNOSIS — L89154 Pressure ulcer of sacral region, stage 4: Secondary | ICD-10-CM

## 2022-12-14 NOTE — Progress Notes (Addendum)
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 wheelchair and an air mattress. I would need to discuss pressure relieved with him more next time to have a sense of this 08/12/15; the patient has been using Hydrofera Blue. Base of the wound appears healthy. Less adherent surface slough. He has an appointment with the plastic surgery at [facility] on March 29. We have been following him every 2 weeks 09/10/15 patient is been to see plastic surgery at [facility]. He is being scheduled for a skin graft to the area. The patient has questions about whether he will be able to manage on his  own these to be keeping off the graft site. He tells me he had some sort of fall when he went to [facility]. He apparently traumatized the wound and it is really significantly larger today but without evidence of infection. Roughly 2 cm wider and precariously close now to his perianal area and some aspects. 03/02/16; we have not seen this patient in 5 months. He is been followed by plastic surgery at [facility]. The last note from plastic surgery I see was dated 12/15/15. He underwent some form of tissue graft on 09/24/15. This did not the do very well. According the patient is not felt that he could easily undergo additional plastic surgery secondary to the wounds close proximity to the anus. Apparently the patient was offered a diverting colostomy at one point. In any case he is only been using wet to dry dressings surprisingly changing this himself at home using a mirror. He does not have home health. He does have a level II pressure-relief surface  as well as a Roho cushion for his wheelchair. In spite of this the wound is considerably larger one than when he was last in the clinic currently measuring 12.5 x 7. There is also an area superiorly in the wound that tunnels more deeply. Clearly a stage III wound 03/15/16 patient presents today for reevaluation concerning his midline sacral pressure ulcer. This again is an extensive ulcer which does not extend to bone fortunately but is sufficiently large to make healing of this wound difficult. Again he has been seen at [facility] where apparently they did discuss with him the possibility of a diverting colostomy but he did not want any part of that. Subsequently he has not followed up there currently. He continues overall to do fairly well all things considered with this wound. He is currently utilizing Medihoney Santyl would be extremely expensive for the amount he would need and likely cost prohibitive. 03/29/16; we'll follow this patient on an every two-week basis.  He has a fairly substantial stage 3 pressure ulcer over his lower sacrum and coccyx and extending into his bilateral gluteal areas left greater than right. He now has home health. I think [facility]. He is applying Medihoney, kerlix and border foam. He arrives today with the intake nurse reporting a large amount of drainage. The patient stated he put his dressing on it 7:00 this morning by the time he arrived here at 10 there was already a moderate to a large amount of drainage. I once again reviewed his history. He had an attempted closure with Bradley [name], Bradley [name] ([id number]) [id number] Page 9 of 14 myocutaneous flap earlier this year at [facility]. This did not go well. He was offered a diverting colostomy but refused. He is not a candidate for a wound VAC as the actual wound is precariously close to his anal opening. As mentioned he does have [facility] but miraculously this patient who is a paraplegic is actually changing the dressings himself. 04/12/16 patient presents today for a follow-up of his essentially large sacral pressure ulcer stage III. Nothing has changed dramatically since I last saw him about one month ago. He has seen Dr. Leanord Bradley [name] once the interim. With that being said patient's wound appears somewhat less macerated today compared to previous evaluations. He still has no pain being a quadriplegic. 04-26-16 [name] returns today for a violation of his stage III sacral pressure ulcer he denies any complaints concerns or issues over the past 2 weeks. He missed to changing dressing twice daily due to drainage although he states this is not an increase in drainage over the past 2 weeks. He does change his dressings independently. He admits to sitting in his motorized chair for no more than 2-3 hours at which time he transfers to bed and rotates lateral position. 05/10/16; Bradley [name] returns today for review of his stage III sacral pressure ulcer. He  denies any concerns over the last 2 weeks although he seems to be running out of Aquacel Ag and on those days he uses Medihoney. He has [facility] was supplying his dressings. He still complains of drainage. He does his dressings independently. He has in his motorized chair for 2-3 hours that time other than that he offloads this. Dimensions of the wound are down 1 cm in both directions. He underwent an aggressive debridement on his last visit of thick circumferential skin and subcutaneous tissue. It is possible at some point in the future he is going to need this  done again 05/24/16; the patient returns today for review of his stage III sacral pressure ulcer. We have been using Aquacel Ag he tells me that he changes this up to twice a day. I'm not really certain of the reason for this frequency of changing. He has some involvement from the home health nurses but I think is doing most of the changing himself which I think because of his paraplegia would be a very difficult exercise. Nevertheless he states that there is "wetness". I am not sure if there is another dressing that we could easily changed that much. I'd wanted to change to [facility] but I'll need to have a sense of how frequent he would need to change this. 06/14/16; this is a patient returns for review of his stage III sacral pressure ulcer. We have been using Aquacel Ag and over the last 2 visits he has had extensive debridement so of the thick circumferential skin and subcutaneous tissue that surrounds this wound. In spite of this really absolutely no change in the condition of the wound warrants measurements. We have [facility] home health I believe changing the dressing on 3 occasions the patient states he does this on one occasion himself 06/28/16; this is a patient who has a fairly large stage III sacral pressure ulcer. I changed him to [facility] from Aquacel 2 weeks ago. He returns today in follow-up. In the meantime a  nurse from [facility] has calledrequesting ordering of a wound VAC. He had this discussion before. The problem is the proximity of the lowest edge of this wound to the patient's anal opening roughly 3/4 of an inch. Can't see how this can be arranged. Apparently the nurse who is calling has a lot of experience, the question would be then when she is not available would be doing this. I would not have thought that this wound is not amenable to a wound VAC because of this reason 07/12/16; the patient comes in today and I have signed orders for a wound VAC. The home health team through [facility] is convinced that he can benefit from this even though there is close proximity to his anal opening beneath the gluteal clefts. The patient does not have a bowel regimen but states he has a bowel movement every 2 days this will also provide some problem with regards to the vac seal 07/26/16; the patient never did obtain a [place] wound VAC as he could not afford the $200 per month co-pay we have been using Hydrofera Blue now for 6 weeks or so. No major change in this wound at all. He is still not interested in the concept of plastic surgery. There changing the dressing every second day 08/09/16; the patient arrives with a wound precisely in the same situation. In keeping with the plan I outlined last time extensive debridement with an open curet the surface of this is not completely viable. Still has some degree of surrounding thick skin and subcutaneous tissue. No evidence of infection. Once again I have had a conversation with him about plastic surgery, he is simply not interested. 08/23/16; wound is really no different. Thick circumferential skin and subcutaneous tissue around the wound edge which is a lot better from debridement we did earlier in the year. The surface of the wound looks viable however with a curet there is definitely a gritty surface to this. We use Medihoney for a while, he could not afford  Santyl. I don't think we could get a supply of  Iodoflex. He talks a little more positively about the concept of plastic surgery which I've gone over with him today 08/31/16;; patient arrives in clinic today with the wound surface really no different there is no changes in dimensions. I debrided today surface on the left upper side of this wound aggressively week ago there is no real change here no evidence of epithelialization. The problem with debridement in the clinic is that he believes from this very liberally. We have been using Sorbact. 09/21/16; absolutely no change in the appearance or measurements of this wound. More recently I've been debrided in this aggressively and using sorbact to see if we could get to a better wound surface. Although this visually looks satisfactory, debridement reveals a very gritty surface to this. However even with this debridement and removal of thick nonviable skin and subcutaneous tissue from around the large amount of the circumference of this wound we have made absolutely no progress. This may be an offloading issue I'm just not completely certain. It has 2 close proximity in its inferior aspect to consider negative pressure therapy 10/26/16; READMISSION This patient called our clinic yesterday to report an odor in his wound. He had been to see plastic surgery at [facility] at our request after his last visit on 09/21/16; we have been seeing him for several months with a large stage III wound. He had been sent to general surgery for consideration of a colostomy, that appointment was not until mid June He comes in today with a temperature of 101. He is reporting an odor in the wound since last weekend. 01/10/17 Readmission: 01/10/17 On evaluation today it is noted that patient has been seen by plastic surgery at [facility] since he was last evaluated here. They did discuss with him the possibility of a flap according to the notes but unfortunately at this point he was not quite  ready to proceed with surgery and instead wanted to give the Wound VAC a try. In the hospital they were able to get a good seal on the Wound VAC. Unfortunately since that time they have been having trouble in regard to his current home health company keeping a simple on the Wound VAC. He would like to switch to a different home health company. With that being said it sounds as if the problem is that his wound VAC is not feeling at the lower portion of his back and he tells me that he can take some of the clear plastic and put over that area when the sill breaks and it will correct it for time. He has no discomfort or pain which is good news. He has been treated with IV vancomycin since he was last seen here and has an appointment with a infectious disease specialist in two days on 01/12/17. Otherwise he was transferred back to [place] for continuing to monitor and manage is wound as she progresses with a Wound VAC for the time being. 01/17/17 on evaluation today patient continues to show evidence of slight improvement with the Wound VAC fortunately there's no evidence of infection or otherwise worsening condition in general. Nonetheless we were unable to get him switch to [facility] in regard to home help from his current company. I'm not sure the reasoning behind but for some reason he was not accepted as a patient with him. Continue to apply the Wound VAC which does still show that some maceration around the wound edges but the wound measurements were slightly improved. No fevers, chills, nausea, or  vomiting noted at this time. 02/14/17; this patient I have not seen in 5 months although he has been readmitted to our clinic seen by our physician assistant Allen Derry twice in early August. I have looked through [facility] notes care everywhere. The patient saw plastic surgery in May [Dr. Bhatt}. The patient was sent to general surgery and ultimately had a colostomy placed. On 11/29/16. This was after he was  admitted to [facility] sometime in May. An MRI of the pelvis on 5/23 showed osteomyelitis of the coccyx. An attempt was made to drain fluid that was not successful. He was treated with empiric broad-spectrum antibiotics VAC/cefepime/Flagyl starting on 11/02/16 with plans for a 6 week course. According to their notes he was sent to a nursing home. Was last seen by Dr. Almedia Balls of plastic surgery on 12/28/16. The first part of the note is a long dissertation about the difficulties finding adequate patients for flap closure of pressure ulcers. At that time the wound was noted to be stage IV based I think on underlying infection no exposed bone and healthy granulation tissue. 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 He was last seen by infectious disease 01/12/17 A Dr. Annye Asa. His note says that Mr. Detter was not interested in a flap closure for referring a trial of the wound VAC. As previously anticipated the wound VAC could not be maintained as an outpatient in the community. He is now using something similar to a Dakin's wet to dry recommended by [facility] VASHE solution. He is placing this twice a day himself. This is almost s hopeless setting in terms of heeling 02/28/17; he is using a Dakin's wet to dry. Most of the wound surface looks satisfactory however the deeper area over his coccyx now has exposed bone I'm not sure if I noted this last week. 03/21/17; patient is usingVASHE solution wet to dry which I gather is a variation on Dakin solution. He has home health changing this 3 times a week the other days he does this himself. His appointment with plastic surgery Bradley [name], Bradley [name] ([id number]) [id number] Page 10 of 14 04/18/17; patient continues to use a variant of Dakin solution I believe. His wound continues to have a clean viable surface. The 2 areas of exposed bone in the center of this wound had closed over. He has an appointment with  plastic surgery on December 5 at which time I hope that there'll be a plan for myocutaneous flap closure In looking through  link I couldn't find any more plastic surgery appointments. I did come across the fact that he is been followed by hematology for a microcytic hypochromic anemia. He had a reasonably normal looking hemoglobin electrophoresis. His iron level was 10 and according to the patient he is going for IV iron infusions starting tomorrow. He had a sedimentation rate of 74. More problematically from a pure wound care point of view his albumin was 2.7 earlier this month 05/17/17; this is a patient I follow monthly. He has a large now stage IV wound over his bilateral buttocks with close proximity to his anal opening. More recently he has developed a large area with exposed bone in the center of this probably secondary to the underlying osteomyelitis E had in the summer. He also follows with Dr. Almedia Balls at [facility] who is plastic surgery. He had an appointment earlier this month and according to the patient Dr. Almedia Balls does not want to proceed with any attempted  flap closure. Although I do not have current access to her note in care everywhere this is likely due to exposed bone. Again according to the patient they did a bone biopsy. He is still using a variant of Dakin solution changing twice a day. He has home health. The patient is not able to give me a firm answer about how long he spends on this in his wheelchair The patient also states that Dr. Almedia Balls wanted to reconsider a wound VAC. I really don't see this as a viable option at least not in the outpatient setting. The wound itself is frankly to close to his anal opening to maintain a seal. The last time we tried to do this home health was unable to manage it. It might be possible to maintain a wound VAC in this setting outside of the home such as a skilled nursing facility or an LTAC however I am doubtful about this even in that  setting **** READMISSION 09/21/17-He is here for evaluation of stage IV sacral ulcer. Since his last evaluation here in December he has completed treatment for sacral osteomyelitis. He was at [facility] for IV therapy and NPWT dressing changes. He was discharged, with home health services, in February. He admits that while in the skilled facility he had "80%" success with maintaining dressing, since discharge he has had approximately "40%" success with maintaining wound VAC dressing. We discussed at length that this is not a safe or recommended option. We will apply Dakin's wet to dry dressing daily and he will follow-up next week. He is accompanied today by his sister who is willing to assist in dressing changes; they will discuss the social issue as he feels he is capable of changing dressing daily when home health is not able. 09/28/17-He is here in follow-up evaluation for stage IV sacral pressure ulcer. He has been using the Dakin's wet-to-dry daily; he continues with home health. He is not accompanied by anyone at this visit. He will follow up in two weeks per his request/preference. 10/12/17 on evaluation today patient appears to be doing very well. The Dakin solution went to dry packings do seem to be helping him as far as the sacral wound is concerned I'm not seeing anything that has me more concerned as far as infection or otherwise is concerned. Overall I'm pleased with the appearance of the wound. 10/26/17-He is here in follow up evaluation for a stage IV sacral ulcer. He continues with daily Dakin's wet-to-dry. He is voicing no complaints or concerns. He will follow-up in 2 weeks 11/16/17-He is here for follow up evaluation for a palliative stage IV sacral pressure ulcer. We will continue with Dakin's wet-to-dry. He will follow-up in 4 weeks. He is expressing concern/complaint regarding new bed that has arrived, stating he is unable to manipulate/maneuver it due to the bed crank  being at the foot of the bed. 12/14/17-He is here for evaluation for palliative stage IV sacral ulcer. He is voicing no complaints or concerns. We will continue with one-to-one ratio of saline and dakins. He will follow-up in 3 weeks 01/04/18-He is here in follow-up evaluation for palliative stage IV sacral ulcer. He is inquiring about reinstating the negative pressure wound therapy. We discussed at length that the negative pressure wound therapy in the home setting has not been successful for him repeatedly with loss of cereal and unavailability of 24/7 help; reminded him that home health is not available 24/7 when loss of seal occurs. He does verbalize understanding  to this and does not pursue. We also discussed the palliative nature of this ulcer (given no significant change/improvement in measurement/appearance, not a candidate for muscle flap per plastic surgery, and continued independent living) and that the goal is for maintenance, decrease in infection and minimizing/avoiding deterioration given that he is independent in his care, does not have home health and requires daily dressing changes secondary to drainage amount. He is inquiring about a wound clinic in [facility], I have informed him that I am unfamiliar with that clinic but that he is encouraged to seek another opinion if that is his desire. We will continue with dakins and he will follow up in three weeks 01/25/18-He is here in follow-up evaluation for palliative stage IV sacral ulcer. He continues with Dakin's/saline 1:1 mixture wet to dry dressing changes. He states he has an appointment at [facility] on 9/17 for evaluation of surgical intervention/closure of the sacral ulcer. He will follow-up here in 4 weeks Readmission: 07/16/2019 upon evaluation today patient appears to be doing really about the same as when he was previously seen here in the wound care center. He most recently was a patient of Bradley [name] back when she was still working here in the  center but had been referred to [facility] for consideration of a flap. With that being said the surgeon there at [facility] stated that this was too large for her flap and they have been attempting to get this smaller in order to be able to proceed with a flap. Nonetheless unfortunately he has had a cycle of going back and forth between the osteomyelitis flaring and then sent him back and then making a little bit of progress only to be sent back again. It sounds like most recently they have been using a Iodoflex type dressing at this point which does not seem to have done any harm by any means. With that being said this wound seems to be quite large for using Iodoflex throughout and subsequently I think he may do much better with the use of Vioxx moistened gauze which would be safe for the new tissue growing and also keep the wound quite nice and clean. The patient is not opposed to this he in fact states that his home health nurse had mentioned this as a possibility as well. 07/23/2019 upon evaluation today patient actually appears to be doing very well in regard to his sacral wound. In fact this is much healthier the measurements not terribly different but again with a wound like this at home necessarily expect a a lot of change as far as the overall measurements are concerned in just 1 week's time. This is going be a much longer term process at this point. With that being said I do think that he is very healthy appearing as far as the base of the wound is concerned. 08/06/2019 upon evaluation today patient actually appears to be doing about the same with regard to his wound to be honest. There is really not a significant improvement overall based on what I am seeing today. Fortunately there is no signs of significant systemic infection. No fevers, chills, nausea, vomiting, or diarrhea. With that being said there is odor to the drainage from the wound and subsequently also what appears to be increased drainage  based on what we are seeing today as well as what his home health nurse called [place] about that she was concerned with as well. 08/13/2019 upon evaluation today patient appears to be doing about the same at  this point with regard to his wound although I think the dressing may be a little less drainage wise compared to what it was previous. Fortunately there is no signs of active infection at this time. No fevers, chills, nausea, vomiting, or diarrhea. He has been taking the antibiotic for only 3 days. 08/23/2019 upon evaluation today patient appears to be doing not nearly as well with regard to his wound. In general really not making a lot of progress which is unfortunate. There does not appear to be any signs of active infection at this time. No fevers, chills, nausea, vomiting, or diarrhea. With that being said the patient unfortunately does seem to be experiencing continued epiboly around the edges of the wound as well as significant scar tissue he has been in the majority of his day sitting in his chair which is likely a big reason for all of this. Fortunately there is no signs of active infection at this time. No fevers, chills, nausea, vomiting, or diarrhea. 08/29/2019 upon evaluation today patient's wound actually appears to be showing some signs of improvement. The region of few areas of new skin growth around the edges of the wound even where he has some of the epiboly. This is actually good news and I think that he has been very aggressive and offloading over the past week since we showed him the pictures of his wound last week. There still may be a chance that he is going require some sharp debridement to clear away some of these edges but to be honest I think that is can be quite an undertaking. The main reason is that he has a lot of thickened scar tissue and it is good to bleed quite significantly in my opinion. The I think if organ to do that we may even need to have a portable or disposable  cautery in order to make sure that we are able to get the area completely sealed up as far as bleeding is concerned. I do have one that we can utilize. However being that the wound looks so much better right now would like to give this 2 weeks to see how things stand and look at that point before making any additional recommendations. TEEGAN, BRANDIS ([id number]) [id number] Page 11 of 14 09/12/2019 upon evaluation today patient appears to be making some progress as far as offloading is concerned. He still has a substantial wound but nonetheless he tells me he is off loading much more aggressively at this point. This is obviously good news. No fevers, chills, nausea, vomiting, or diarrhea. 4/22; I have not seen this patient in quite some time. No major change in the condition of the wound or its wound volume. Surrounding maceration of the skin around the edges of the wound. The wound is fairly substantial. T close to the anal opening to consider a wound VAC. He had extensive trials of plastic o surgery at [facility] which really does not result in any improvement. His wound bed is however clean 10/10/2019 upon evaluation today patient appears to be doing well with regard to the wound in the sacral region. He has been tolerating the dressing changes without complication. Fortunately there is no signs of infection and he actually has some epithelial growth noted as well. He tells me has been trying to continue to offload this area which is excellent that is what he needs to do most as far as what he can have in his control. Otherwise I feel like the  collagen with a saline moistened gauze behind has been beneficial for him. 5/20; collagen and normal saline wet-to-dry. Although the tissue when this large sacral wound looks reasonable there is been absolutely no benefit to the overall closure. He has macerated skin around this. He tells me that he spends a large part of the day  up in the wheelchair. He still has home health 11/07/2019 upon evaluation today patient appears to be doing well with regard to his wounds at this point. Fortunately there is no signs of active infection at this time. Overall I feel like his sacral wound in general appears to be doing quite nicely. I am very pleased with overall how things are progressing and I feel like the patient is making excellent progress towards resolution. With that being said he still has a long ways to go before we expect to see this completely healed. 11/21/2019 upon evaluation today patient appears to be doing about the same in regard to his sacral wound. Unfortunately he is really not making much in the way of improvement when I questioned him about how much time he is really spending sitting on his bottom he tells me that the majority of his day is mainly in that position though he does spend some time in bed. It does not sound like it is enough however neither does it look like it is enough. 12/24/2019 upon evaluation today patient appears to be doing really about the same there is no significant improvement overall in his wound bed. Last time he was seen here we did have a discussion about the possibility of seeing if I can get a surgeon to evaluate and potentially clear away some of the thickened edges of the wound in order to allow this wound to potentially have a chance of closing with a wound VAC. Right now we could not even get a wound VAC to seal with some of the rolled and thickened edges. With that being said elected to the patient to think about this to discuss today. At this point the patient does want to see if I could make the referral to potentially see if the surgeon would be able to perform this and subsequently he agrees to go to a skilled nursing facility following if they are able to do this in order to allow for a wound VAC and to get this wound to heal more effectively and quickly. In my opinion the  only way that I would recommend that he see the surgeon to clear away the edges would be if he is also in agreement with going to skilled nursing facility as I know he cannot appropriately offload at home to take care of this and that setting. 01/07/2020 on evaluation today patient appears to be doing about the same in regard to his wound. There is no signs of active infection at this time which is great news. All in all he is really doing about the same. We have made referral to surgery to see if they can potentially perform debridement to clear away some of the scarred edging of the wound so we could potentially see about a wound VAC to try to help this heal more efficiently. Again we have discussed that if this occurs he would be in a skilled nursing facility following or release would need to be in order to appropriately offload. Nonetheless we are still in the process of working that referral. 01/21/2020 upon evaluation today patient appears to be doing about the same in  regard to his sacral wound. He did see Dr. Tonna Boehringer Dr. Geoffery Lyons feeling was that there was not anything surgically he could offer at this point as he did not feel like that a wound VAC would likely be appropriate for the patient. With that being said he did recommend potential for trying to get the patient into a skilled nursing facility as an outpatient and they will get me working on that as well. Fortunately there is no signs of active infection at this time. 11/10; this is a patient that has not been seen here in quite a bit of time while we were in our alternative venue. As well I have not seen this patient in probably over 2 years. As I remember things with him he has had this pressure ulcer encompassing his lower sacrum and coccyx and the surrounding buttocks for a prolonged period of time. When he first came here the surface looked fairly good and the wound was smaller we attempted a wound VAC but we could never keep this in  place. Because of nonresponse of the wound to topical dressings we referred him to [facility] plastic surgery where preparation was being made for flap closure. Intact he had a colostomy however after that he was deemed to have 2 large wound for flap closure. He did have osteomyelitis in the coccyx area in 2019. He is using wet-to-dry he has home health. They are managing his ostomy supplies as well. The wound is substantially larger than what I remember. Versus last time he was here to wound months or so ago he has an unstageable wound on the left upper thigh posteriorly. He tells me he is in his chair quite a bit especially recently 12/1; patient comes back to clinic. He did not get the lab work done apparently home health would not do this they referred him to the lab where he is apparently having a CBC checked because he is on iron infusions. We will try to get the lab work I ordered done there. He also did not get the x-ray he says he does not remember the discussion. The issue is that this large wound has probing areas to bone. He may have underlying osteomyelitis again he was treated for this previously I think in 2019 12/15; the patient did have lab work done. Notable for iron deficiency anemia with a hemoglobin of 9.7 although he is following with hematology for this his CRP was 7.1 which is up from his previous value. sedimentation rate of 91 which is also higher. However he maintains a relatively high sedimentation rate. Most disturbingly was an albumin of 2.8 although this is also somewhat higher than the 2.2 we had previously. With some difficulty I think a home health nurse actually made him some Dakin's solution he is using a Dakin's wet-to-dry to the large sacral wound. He has the area on the left upper thigh as well 06/17/2020; I follow this patient on a monthly basis. 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 At one point he had underlying osteomyelitis  here and he received antibiotic therapy. I believe this was done through [facility] We have been using palliative Dakin's solution wet-to-dry on this which is really This very large wound about the same with healthy surface. No active infection that is obvious. He has developed an area on the left posterior thigh and now a mirror image area on the right posterior thigh noted pressure injuries from the wheelchair.  This occurs even though he has a Roho cushion which is apparently new I have been forwarded lab work from 05/15/2020. At that point his hemoglobin was 9.2 down from 13.8 on 10/5 this is microcytic hypochromic. He has known iron deficiency although the exact cause of this is never really been clear to me. He follows with hematology oncology and has been receiving IV iron he is supposed to follow-up in mid January and I told the patient this. From wound care point of view his albumin is 2.9 sedimentation rate is 74. His sedimentation rate has always been elevated in this range however his C- reactive protein was 82 which is way above anything previously measured. I see that on 05/08/2020 this was 7.1 I am not sure why the rapid discrepancy. He is supposed to have an x-ray of his lower sacrum looking for evidence of osteomyelitis that he was previously treated for. Is possible he would also require more advanced imaging such as another C CT or MRI He comes in also with paperwork for a Cl[place]ron air-fluidized bed with a trapeze bar. I asked him if he would be able to transfer in and out of this bed . I am not opposed to ordering it, but I do not want to make his transfers more difficult than they already are. He has a Nurse, adult 07/15/2020; he is managed to get his Clinitron air-fluidized bed. He has a new electric wheelchair with a wheelchair cushion. The x-ray I did showed some sacral bone destruction although this did not seem progressive him last time. He was treated for osteomyelitis in this area  previously in 2018. His sedimentation rate is 91 CRP 7.1 albumin 2.8. We are using Dakin's wet-to-dry. He has [facility] home health 4/6; 29-month follow-up. He has a Clinitron air-fluidized bed. He has the wheelchair cushion. His major wound on the coccyx looks somewhat better including including both of [place] my nurse and my feeling that it is closed in somewhat. Wound is still too close to his anal opening to consider a wound VAC. At 1 point we were trying to get him into [facility] plastics to get a flap closure but for 1 reason or another this never moved forward I was never really certain what the issue was. He has an area on the left upper thigh in the gluteal fold. This is a clean wound. Bradley [name], Bradley [name] ([id number]) [id number] Page 12 of 14 6/1; 74-month follow-up. I follow this man on a palliative basis he has a chronic stage IV wound over the lower sacrum coccyx and surrounding buttocks. He has an air-fluidized bed and a wheelchair cushion but he spends more time in the wheelchair than he does off this area by his own admission. I do not think the area is really changed that much although it is probably deeper in 1 section. He does tell us that he was discharged from home health because of lack of change of the wound. This was [employer]. He was treated for underlying osteomyelitis several years ago and he may have underlying chronic osteomyelitis. I do not believe he ever got the x-ray I ordered 8/3; patient presents for his 80-month follow-up. He reports no issues or complaints today. He continues to use Dakin's wet-to-dry dressings to his sacrum and silver alginate to his left lateral thigh wound. He denies signs of infection. 10/5; patient presents for 68-month follow-up. He has no issues or complaints today. He continues to use Dakin's wet-to-dry dressings to his sacrum. He states  that the left lateral thigh wound is healed. He denies signs of infection. 12/7; patient  presents for 28-month follow-up. He states he would like to obtain a new air loss mattress bed. He states his current one is having technical difficulties. He currently is using Dakin's wet-to-dry dressings. He denies signs of infection. 2/1; patient presents for follow-up. He has no issues or complaints today. He currently uses Dakin's wet-to-dry dressings. He denies signs of infection. He has his new air loss mattress. 4/5; patient presents for follow-up. He has been using Dakin's wet-to-dry dressings. He has no issues or complaints today. He denies signs of infection. 11-10-2021 upon evaluation today patient's wound is doing better since I last saw him although it has been quite sometime since then to be perfectly honest. In fact I think it may have been even back in 2022 if I am not mistaken. Either way he has been seeing Dr. Mikey Bussing roughly every 2 months per his request. His wound still though smaller does have a significant amount of thick scar tissue surrounding the edge which is making it very difficult to heal to be honest. His son is performing the dressing changes at home currently. 8/23; patient presents for follow-up. He has no issues or complaints today. He has been doing Dakin's wet-to-dry dressings. He is not interested in any further surgical intervention for his sacral wound. He denies signs of infection. 11/15; Patient presents for follow up. He has been using Dakins wet to dry dressings. He has no issues or complaints. 2/7; patient presents for his regular 41-month follow-up. He continues to use Dakin's wet-to-dry dressings. He has no issues or complaints today. 4/10; patient presents for follow-up. He has been using Dakin's wet-to-dry dressings. He has no issues or complaints. He denies signs of infection. 6/12; patient presents for follow-up. He has been using Dakin's wet-to-dry dressings to the sacral wound. He has a small area of skin breakdown to the left posterior leg and has  been keeping this covered. He has no issues or complaints today. He denies signs of infection. 6/26; patient presents for follow-up. He has been using Dakin's wet-to-dry dressings to the sacral wound he now has another area of skin breakdown just above the previous left posterior leg wound. Appears to be from friction. He has been using Hydrofera Blue to these wounds. Insurance is asking patient to be seen weekly in order for him to keep his air mattress. After 5 weeks he can go out 2 months per patient Report. 7/3; patient presents for follow-up. He has been using Dakin's wet-to-dry dressings to the sacral wound and Hydrofera Blue to the left posterior leg wound. 7/10; patient presents for follow-up. He has been using Dakin's wet-to-dry dressings to the sacral wound and Hydrofera Blue with Medihoney to the left posterior leg wound. Wounds are stable. Objective Constitutional Vitals Time Taken: 3:10 PM, Temperature: 98.5 F, Pulse: 84 bpm, Respiratory Rate: 16 breaths/min, Blood Pressure: 114/66 mmHg. General Notes: Sacral ulcer: Granulation tissue throughout. Not all areas of the wound bed appears healthy. No probing to bone. No obvious signs of infection. Open wounds to the posterior left leg with granulation tissue and nonviable tissue. No signs of infection. Integumentary (Hair, Skin) Wound #10 status is Open. Original cause of wound was Pressure Injury. The date acquired was: 06/07/2015. The wound has been in treatment 178 weeks. The wound is located on the Midline Sacrum. The wound measures 8cm length x 3.5cm width x 2.4cm depth; 21.991cm^2 area and 52.779cm^3 volume.  There is muscle and Fat Layer (Subcutaneous Tissue) exposed. There is a medium amount of serosanguineous drainage noted. The wound margin is epibole. There is large (67-100%) red, pink granulation within the wound bed. There is a small (1-33%) amount of necrotic tissue within the wound bed including Adherent Slough. Wound #14 status  is Open. Original cause of wound was Shear/Friction. The date acquired was: 11/05/2022. The wound has been in treatment 4 weeks. The wound is located on the Left,Posterior Upper Leg. The wound measures 5cm length x 3cm width x 0.2cm depth; 11.781cm^2 area and 2.356cm^3 volume. There is Fat Layer (Subcutaneous Tissue) exposed. There is a medium amount of serous drainage noted. The wound margin is thickened. There is large (67-100%) red, pink granulation within the wound bed. There is no necrotic tissue within the wound bed. Assessment Active Problems ICD-10 Pressure ulcer of sacral region, stage 4 Pressure ulcer of other site, stage 2 Unspecified open wound, left lower leg, initial encounter Other chronic osteomyelitis, other site Paraplegia, unspecified ARYEH, BUTTERFIELD ([id number]) [id number] Page 13 of 14 Patient's sacral wound has healthier granulation tissue present today. I recommended continuing aggressive offloading and Dakin's wet-to-dry dressings. The left posterior leg wound still has nonviable tissue tightly adhered and I recommended stopping the Medihoney and using mupirocin ointment. No obvious signs of soft tissue infection. Again needs to have aggressive offloading. Follow-up in 1 week. Plan Follow-up Appointments: Other: - 2 months palliative care Bathing/ Shower/ Hygiene: May shower; gently cleanse wound with antibacterial soap, rinse and pat dry prior to dressing wounds Off-Loading: Roho cushion for wheelchair Low air-loss mattress (Group 2) Turn and reposition every 2 hours The following medication(s) was prescribed: mupirocin topical 2 % ointment 1 Apply daily to the wound bed starting 12/14/2022 WOUND #10: - Sacrum Wound Laterality: Midline Cleanser: Dakin 16 (oz) 0.25 1 x Per Day/30 Days Discharge Instructions: Use as directed. Prim Dressing: Gauze (Generic) 1 x Per Day/30 Days ary Discharge Instructions: As directed: moistened with Dakins  Solution Secondary Dressing: Zetuvit Plus 4x4 (in/in) 1 x Per Day/30 Days Secured With: Medipore T - 51M Medipore H Soft Cloth Surgical T ape ape, 2x2 (in/yd) (Dispense As Written) 1 x Per Day/30 Days WOUND #14: - Upper Leg Wound Laterality: Left, Posterior Topical: Mupirocin Ointment 1 x Per Day/30 Days Discharge Instructions: Apply as directed by provider. Prim Dressing: Hydrofera Blue Ready Transfer Foam, 2.5x2.5 (in/in) (Generic) 1 x Per Day/30 Days ary Discharge Instructions: Apply Hydrofera Blue Ready to wound bed as directed Secured With: T egaderm Film Transparent 6x8 (in/in) 1 x Per Day/30 Days Discharge Instructions: Apply to wound bed 1. Dakin's wet-to-dry dressings 2. Mupirocin and Hydrofera Blue 3. Aggressive offloading 4. Follow-up in 1 week Electronic Signature(s) Signed: 12/14/2022 5:05:21 PM By: Geralyn Corwin DO Entered By: Geralyn Corwin on 12/14/2022 16:04:53 -------------------------------------------------------------------------------- SuperBill Details Patient Name: Date of Service: Bradley Pink MES A. 12/14/2022 Medical Record Number: [id number] Patient Account Number: [id number] Date of Birth/Sex: Treating RN: 1945/08/18 (77 y.o. Bradley [name] Primary Care Provider: Marisue [name] Other Clinician: Betha [name] Referring Provider: Treating Provider/Extender: Claudie Fisherman Weeks in Treatment: 178 Diagnosis Coding ICD-10 Codes Code Description L89.154 Pressure ulcer of sacral region, stage 4 L89.892 Pressure ulcer of other site, stage 2 S81.802A Unspecified open wound, left lower leg, initial encounter M86.68 Other chronic osteomyelitis, other site POWELL, HALBERT ([id number]) [id number] Page 14 of 14 G82.20 Paraplegia, unspecified Facility Procedures : CPT4 Code: [id number] Description: 99214 - WOUND CARE VISIT-LEV 4 EST PT  Modifier: Quantity: 1 Physician Procedures : CPT4 Code Description  Modifier [id number] - WC PHYS LEVEL 3 - EST PT ICD-10 Diagnosis Description L89.154 Pressure ulcer of sacral region, stage 4 L89.892 Pressure ulcer of other site, stage 2 S81.802A Unspecified open wound, left lower leg, initial  encounter M86.68 Other chronic osteomyelitis, other site Quantity: 1 Electronic Signature(s) Signed: 12/14/2022 5:05:21 PM By: Geralyn Corwin DO Entered By: Geralyn Corwin on 12/14/2022 16:05:15

## 2022-12-15 DIAGNOSIS — L89209 Pressure ulcer of unspecified hip, unspecified stage: Secondary | ICD-10-CM | POA: Diagnosis not present

## 2022-12-21 ENCOUNTER — Encounter (HOSPITAL_BASED_OUTPATIENT_CLINIC_OR_DEPARTMENT_OTHER): Payer: Medicare HMO | Admitting: Internal Medicine

## 2022-12-21 DIAGNOSIS — D509 Iron deficiency anemia, unspecified: Secondary | ICD-10-CM | POA: Diagnosis not present

## 2022-12-21 DIAGNOSIS — R252 Cramp and spasm: Secondary | ICD-10-CM | POA: Diagnosis not present

## 2022-12-21 DIAGNOSIS — S81809A Unspecified open wound, unspecified lower leg, initial encounter: Secondary | ICD-10-CM | POA: Diagnosis not present

## 2022-12-21 DIAGNOSIS — L24A9 Irritant contact dermatitis due friction or contact with other specified body fluids: Secondary | ICD-10-CM | POA: Diagnosis not present

## 2022-12-21 DIAGNOSIS — K631 Perforation of intestine (nontraumatic): Secondary | ICD-10-CM | POA: Diagnosis not present

## 2022-12-21 DIAGNOSIS — S81802A Unspecified open wound, left lower leg, initial encounter: Secondary | ICD-10-CM | POA: Diagnosis not present

## 2022-12-21 DIAGNOSIS — L89892 Pressure ulcer of other site, stage 2: Secondary | ICD-10-CM

## 2022-12-21 DIAGNOSIS — M069 Rheumatoid arthritis, unspecified: Secondary | ICD-10-CM | POA: Diagnosis not present

## 2022-12-21 DIAGNOSIS — L89153 Pressure ulcer of sacral region, stage 3: Secondary | ICD-10-CM | POA: Diagnosis not present

## 2022-12-21 DIAGNOSIS — L89154 Pressure ulcer of sacral region, stage 4: Secondary | ICD-10-CM

## 2022-12-21 DIAGNOSIS — S31000A Unspecified open wound of lower back and pelvis without penetration into retroperitoneum, initial encounter: Secondary | ICD-10-CM | POA: Diagnosis not present

## 2022-12-21 DIAGNOSIS — G8220 Paraplegia, unspecified: Secondary | ICD-10-CM | POA: Diagnosis not present

## 2022-12-21 DIAGNOSIS — I10 Essential (primary) hypertension: Secondary | ICD-10-CM | POA: Diagnosis not present

## 2022-12-21 DIAGNOSIS — Z933 Colostomy status: Secondary | ICD-10-CM | POA: Diagnosis not present

## 2022-12-21 DIAGNOSIS — Z993 Dependence on wheelchair: Secondary | ICD-10-CM | POA: Diagnosis not present

## 2022-12-21 DIAGNOSIS — L89159 Pressure ulcer of sacral region, unspecified stage: Secondary | ICD-10-CM | POA: Diagnosis not present

## 2022-12-21 DIAGNOSIS — L97129 Non-pressure chronic ulcer of left thigh with unspecified severity: Secondary | ICD-10-CM | POA: Diagnosis not present

## 2022-12-21 DIAGNOSIS — L97111 Non-pressure chronic ulcer of right thigh limited to breakdown of skin: Secondary | ICD-10-CM | POA: Diagnosis not present

## 2022-12-21 DIAGNOSIS — N319 Neuromuscular dysfunction of bladder, unspecified: Secondary | ICD-10-CM | POA: Diagnosis not present

## 2022-12-21 DIAGNOSIS — M8668 Other chronic osteomyelitis, other site: Secondary | ICD-10-CM

## 2022-12-21 NOTE — Progress Notes (Signed)
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 that description sounded vague as well. He has a rotation on his wheelchair and an air mattress. I would need to discuss pressure relieved with him more next time to have a sense of this 08/12/15; the patient has been using Hydrofera Blue. Base of the wound appears healthy. Less adherent surface slough. He has an appointment with the plastic surgery at [facility] on March 29. We have been following him every 2 weeks 09/10/15 patient is been to see plastic surgery at [facility]. He is being scheduled for a skin graft to the area. The  patient has questions about whether he will be able to manage on his own these to be keeping off the graft site. He tells me he had some sort of fall when he went to [facility]. He apparently traumatized the wound and it is really significantly larger today but without evidence of infection. Roughly 2 cm wider and precariously close now to his perianal area and some aspects. 03/02/16; we have not seen this patient in 5 months. He is been followed by plastic surgery at [facility]. The last note from plastic surgery I see was dated 12/15/15. He underwent some form of tissue graft on 09/24/15. This did not the do very well. According the patient is not felt that he could easily undergo additional plastic surgery secondary to the wounds close proximity to the anus. Apparently the patient was offered a diverting colostomy at one point. In any case he is only been using wet to dry dressings surprisingly changing this himself at home using a mirror.  He does not have home health. He does have a level II pressure-relief surface as well as a Roho cushion for his wheelchair. In spite of this the wound is considerably larger one than when he was last in the clinic currently measuring 12.5 x 7. There is also an area superiorly in the wound that tunnels more deeply. Clearly a stage III wound 03/15/16 patient presents today for reevaluation concerning his midline sacral pressure ulcer. This again is an extensive ulcer which does not extend to bone fortunately but is sufficiently large to make healing of this wound difficult. Again he has been seen at [facility] where apparently they did discuss with him the possibility of a diverting colostomy but he did not want any part of that. Subsequently he has not followed up there currently. He continues overall to do fairly well all things considered with this wound. He is currently utilizing Medihoney Santyl would be extremely expensive for the amount he would need and likely cost  prohibitive. 03/29/16; we'll follow this patient on an every two-week basis. He has a fairly substantial stage 3 pressure ulcer over his lower sacrum and coccyx and extending into his bilateral gluteal areas left greater than right. He now has home health. I think [facility]. He is applying Medihoney, kerlix and border foam. He arrives today with the intake nurse reporting a large amount of drainage. The patient stated he put his dressing on it 7:00 this morning by the time he arrived here at 10 there was already a moderate to a large amount of drainage. I once again reviewed his history. He had an attempted closure with myocutaneous flap earlier this year at [facility]. This did not go well. He was offered a diverting colostomy but refused. He is not a candidate for a wound VAC as the actual wound is precariously close to his anal opening. As mentioned he does have [facility] but miraculously this patient who is a paraplegic is actually changing the dressings himself. 04/12/16 patient presents today for a follow-up of his essentially large sacral pressure ulcer stage III. Nothing has changed dramatically since I last saw him about one month ago. He has seen Dr. Leanord Hawking once the interim. With that being said patient's wound appears somewhat less macerated today compared to previous evaluations. He still has no pain being a quadriplegic. Bradley [name], Bradley [name] ([id number]) [id number] Page 9 of 16 04-26-16 Mr. Hurwitz returns today for a violation of his stage III sacral pressure ulcer he denies any complaints concerns or issues over the past 2 weeks. He missed to changing dressing twice daily due to drainage although he states this is not an increase in drainage over the past 2 weeks. He does change his dressings independently. He admits to sitting in his motorized chair for no more than 2-3 hours at which time he transfers to bed and rotates lateral position. 05/10/16; Kanyon Seibold returns today for review of his stage III sacral pressure ulcer. He denies any concerns over the last 2 weeks although he seems to be running out of Aquacel Ag and on those days he uses Medihoney. He has [facility] was supplying his dressings. He still complains of drainage. He does his dressings independently. He has in his motorized chair for 2-3 hours that time other than that he offloads this. Dimensions of the wound are down 1 cm in both directions. He underwent an aggressive debridement on his last visit of thick circumferential skin and subcutaneous tissue. It  is possible at some point in the future he is going to need this done again 05/24/16; the patient returns today for review of his stage III sacral pressure ulcer. We have been using Aquacel Ag he tells me that he changes this up to twice a day. I'm not really certain of the reason for this frequency of changing. He has some involvement from the home health nurses but I think is doing most of the changing himself which I think because of his paraplegia would be a very difficult exercise. Nevertheless he states that there is "wetness". I am not sure if there is another dressing that we could easily changed that much. I'd wanted to change to [facility] but I'll need to have a sense of how frequent he would need to change this. 06/14/16; this is a patient returns for review of his stage III sacral pressure ulcer. We have been using Aquacel Ag and over the last 2 visits he has had extensive debridement so of the thick circumferential skin and subcutaneous tissue that surrounds this wound. In spite of this really absolutely no change in the condition of the wound warrants measurements. We have [facility] home health I believe changing the dressing on 3 occasions the patient states he does this on one occasion himself 06/28/16; this is a patient who has a fairly large stage III sacral pressure ulcer. I changed him to [facility]  from Aquacel 2 weeks ago. He returns today in follow-up. In the meantime a nurse from [facility] has calledrequesting ordering of a wound VAC. He had this discussion before. The problem is the proximity of the lowest edge of this wound to the patient's anal opening roughly 3/4 of an inch. Can't see how this can be arranged. Apparently the nurse who is calling has a lot of experience, the question would be then when she is not available would be doing this. I would not have thought that this wound is not amenable to a wound VAC because of this reason 07/12/16; the patient comes in today and I have signed orders for a wound VAC. The home health team through [facility] is convinced that he can benefit from this even though there is close proximity to his anal opening beneath the gluteal clefts. The patient does not have a bowel regimen but states he has a bowel movement every 2 days this will also provide some problem with regards to the vac seal 07/26/16; the patient never did obtain a [place] wound VAC as he could not afford the $200 per month co-pay we have been using Hydrofera Blue now for 6 weeks or so. No major change in this wound at all. He is still not interested in the concept of plastic surgery. There changing the dressing every second day 08/09/16; the patient arrives with a wound precisely in the same situation. In keeping with the plan I outlined last time extensive debridement with an open curet the surface of this is not completely viable. Still has some degree of surrounding thick skin and subcutaneous tissue. No evidence of infection. Once again I have had a conversation with him about plastic surgery, he is simply not interested. 08/23/16; wound is really no different. Thick circumferential skin and subcutaneous tissue around the wound edge which is a lot better from debridement we did earlier in the year. The surface of the wound looks viable however with a curet there is definitely a  gritty surface to this. We use Medihoney for a while,  he could not afford Santyl. I don't think we could get a supply of Iodoflex. He talks a little more positively about the concept of plastic surgery which I've gone over with him today 08/31/16;; patient arrives in clinic today with the wound surface really no different there is no changes in dimensions. I debrided today surface on the left upper side of this wound aggressively week ago there is no real change here no evidence of epithelialization. The problem with debridement in the clinic is that he believes from this very liberally. We have been using Sorbact. 09/21/16; absolutely no change in the appearance or measurements of this wound. More recently I've been debrided in this aggressively and using sorbact to see if we could get to a better wound surface. Although this visually looks satisfactory, debridement reveals a very gritty surface to this. However even with this debridement and removal of thick nonviable skin and subcutaneous tissue from around the large amount of the circumference of this wound we have made absolutely no progress. This may be an offloading issue I'm just not completely certain. It has 2 close proximity in its inferior aspect to consider negative pressure therapy 10/26/16; READMISSION This patient called our clinic yesterday to report an odor in his wound. He had been to see plastic surgery at [facility] at our request after his last visit on 09/21/16; we have been seeing him for several months with a large stage III wound. He had been sent to general surgery for consideration of a colostomy, that appointment was not until mid June He comes in today with a temperature of 101. He is reporting an odor in the wound since last weekend. 01/10/17 Readmission: 01/10/17 On evaluation today it is noted that patient has been seen by plastic surgery at [facility] since he was last evaluated here. They did discuss with him the possibility of a flap  according to the notes but unfortunately at this point he was not quite ready to proceed with surgery and instead wanted to give the Wound VAC a try. In the hospital they were able to get a good seal on the Wound VAC. Unfortunately since that time they have been having trouble in regard to his current home health company keeping a simple on the Wound VAC. He would like to switch to a different home health company. With that being said it sounds as if the problem is that his wound VAC is not feeling at the lower portion of his back and he tells me that he can take some of the clear plastic and put over that area when the sill breaks and it will correct it for time. He has no discomfort or pain which is good news. He has been treated with IV vancomycin since he was last seen here and has an appointment with a infectious disease specialist in two days on 01/12/17. Otherwise he was transferred back to [place] for continuing to monitor and manage is wound as she progresses with a Wound VAC for the time being. 01/17/17 on evaluation today patient continues to show evidence of slight improvement with the Wound VAC fortunately there's no evidence of infection or otherwise worsening condition in general. Nonetheless we were unable to get him switch to [facility] in regard to home help from his current company. I'm not sure the reasoning behind but for some reason he was not accepted as a patient with him. Continue to apply the Wound VAC which does still show that some maceration around the  wound edges but the wound measurements were slightly improved. No fevers, chills, nausea, or vomiting noted at this time. 02/14/17; this patient I have not seen in 5 months although he has been readmitted to our clinic seen by our physician assistant Allen Derry twice in early August. I have looked through [facility] notes care everywhere. The patient saw plastic surgery in May [Dr. Bhatt}. The patient was sent to general surgery  and ultimately had a colostomy placed. On 11/29/16. This was after he was admitted to [facility] sometime in May. An MRI of the pelvis on 5/23 showed osteomyelitis of the coccyx. An attempt was made to drain fluid that was not successful. He was treated with empiric broad-spectrum antibiotics VAC/cefepime/Flagyl starting on 11/02/16 with plans for a 6 week course. According to their notes he was sent to a nursing home. Was last seen by Dr. Almedia Balls of plastic surgery on 12/28/16. The first part of the note is a long dissertation about the difficulties finding adequate patients for flap closure of pressure ulcers. At that time the wound was noted to be stage IV based I think on underlying infection no exposed bone and healthy granulation tissue. 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 He was last seen by infectious disease 01/12/17 A Dr. Annye Asa. His note says that Mr. Blethen was not interested in a flap closure for referring a trial of the wound VAC. As previously anticipated the wound VAC could not be maintained as an outpatient in the community. He is now using something similar to a Dakin's wet to dry recommended by [facility] VASHE solution. He is placing this twice a day himself. This is almost s hopeless setting in terms of heeling 02/28/17; he is using a Dakin's wet to dry. Most of the wound surface looks satisfactory however the deeper area over his coccyx now has exposed bone I'm not sure if I noted this last week. 03/21/17; patient is usingVASHE solution wet to dry which I gather is a variation on Dakin solution. He has home health changing this 3 times a week the other days he does this himself. His appointment with plastic surgery 04/18/17; patient continues to use a variant of Dakin solution I believe. His wound continues to have a clean viable surface. The 2 areas of exposed bone in the center of this wound had closed over. He has an appointment with plastic  surgery on December 5 at which time I hope that there'll be a plan for myocutaneous flap closure In looking through [facility] link I couldn't find any more plastic surgery appointments. I did come across the fact that he is been followed by hematology for a microcytic hypochromic anemia. He had a reasonably normal looking hemoglobin electrophoresis. His iron level was 10 and according to the patient he is going for IV iron infusions starting tomorrow. He had a sedimentation rate of 74. More problematically from a pure wound care point of view his albumin was Bradley [name], Bradley [name] ([id number]) [id number] Page 10 of 16 2.7 earlier this month 05/17/17; this is a patient I follow monthly. He has a large now stage IV wound over his bilateral buttocks with close proximity to his anal opening. More recently he has developed a large area with exposed bone in the center of this probably secondary to the underlying osteomyelitis E had in the summer. He also follows with Dr. Almedia Balls at [facility] who is plastic surgery. He had an appointment earlier this month and  according to the patient Dr. Almedia Balls does not want to proceed with any attempted flap closure. Although I do not have current access to her note in care everywhere this is likely due to exposed bone. Again according to the patient they did a bone biopsy. He is still using a variant of Dakin solution changing twice a day. He has home health. The patient is not able to give me a firm answer about how long he spends on this in his wheelchair The patient also states that Dr. Almedia Balls wanted to reconsider a wound VAC. I really don't see this as a viable option at least not in the outpatient setting. The wound itself is frankly to close to his anal opening to maintain a seal. The last time we tried to do this home health was unable to manage it. It might be possible to maintain a wound VAC in this setting outside of the home such as a skilled  nursing facility or an LTAC however I am doubtful about this even in that setting **** READMISSION 09/21/17-He is here for evaluation of stage IV sacral ulcer. Since his last evaluation here in December he has completed treatment for sacral osteomyelitis. He was at [facility] healthcare for IV therapy and NPWT dressing changes. He was discharged, with home health services, in February. He admits that while in the skilled facility he had "80%" success with maintaining dressing, since discharge he has had approximately "40%" success with maintaining wound VAC dressing. We discussed at length that this is not a safe or recommended option. We will apply Dakin's wet to dry dressing daily and he will follow-up next week. He is accompanied today by his sister who is willing to assist in dressing changes; they will discuss the social issue as he feels he is capable of changing dressing daily when home health is not able. 09/28/17-He is here in follow-up evaluation for stage IV sacral pressure ulcer. He has been using the Dakin's wet-to-dry daily; he continues with home health. He is not accompanied by anyone at this visit. He will follow up in two weeks per his request/preference. 10/12/17 on evaluation today patient appears to be doing very well. The Dakin solution went to dry packings do seem to be helping him as far as the sacral wound is concerned I'm not seeing anything that has me more concerned as far as infection or otherwise is concerned. Overall I'm pleased with the appearance of the wound. 10/26/17-He is here in follow up evaluation for a stage IV sacral ulcer. He continues with daily Dakin's wet-to-dry. He is voicing no complaints or concerns. He will follow-up in 2 weeks 11/16/17-He is here for follow up evaluation for a palliative stage IV sacral pressure ulcer. We will continue with Dakin's wet-to-dry. He will follow-up in 4 weeks. He is expressing concern/complaint regarding new bed that has  arrived, stating he is unable to manipulate/maneuver it due to the bed crank being at the foot of the bed. 12/14/17-He is here for evaluation for palliative stage IV sacral ulcer. He is voicing no complaints or concerns. We will continue with one-to-one ratio of saline and dakins. He will follow-up in 3 weeks 01/04/18-He is here in follow-up evaluation for palliative stage IV sacral ulcer. He is inquiring about reinstating the negative pressure wound therapy. We discussed at length that the negative pressure wound therapy in the home setting has not been successful for him repeatedly with loss of cereal and unavailability of 24/7 help; reminded him that home  health is not available 24/7 when loss of seal occurs. He does verbalize understanding to this and does not pursue. We also discussed the palliative nature of this ulcer (given no significant change/improvement in measurement/appearance, not a candidate for muscle flap per plastic surgery, and continued independent living) and that the goal is for maintenance, decrease in infection and minimizing/avoiding deterioration given that he is independent in his care, does not have home health and requires daily dressing changes secondary to drainage amount. He is inquiring about a wound clinic in [facility], I have informed him that I am unfamiliar with that clinic but that he is encouraged to seek another opinion if that is his desire. We will continue with dakins and he will follow up in three weeks 01/25/18-He is here in follow-up evaluation for palliative stage IV sacral ulcer. He continues with Dakin's/saline 1:1 mixture wet to dry dressing changes. He states he has an appointment at [facility] on 9/17 for evaluation of surgical intervention/closure of the sacral ulcer. He will follow-up here in 4 weeks Readmission: 07/16/2019 upon evaluation today patient appears to be doing really about the same as when he was previously seen here in the wound care center. He  most recently was a patient of [name] back when she was still working here in the center but had been referred to [facility] for consideration of a flap. With that being said the surgeon there at [facility] stated that this was too large for her flap and they have been attempting to get this smaller in order to be able to proceed with a flap. Nonetheless unfortunately he has had a cycle of going back and forth between the osteomyelitis flaring and then sent him back and then making a little bit of progress only to be sent back again. It sounds like most recently they have been using a Iodoflex type dressing at this point which does not seem to have done any harm by any means. With that being said this wound seems to be quite large for using Iodoflex throughout and subsequently I think he may do much better with the use of Vioxx moistened gauze which would be safe for the new tissue growing and also keep the wound quite nice and clean. The patient is not opposed to this he in fact states that his home health nurse had mentioned this as a possibility as well. 07/23/2019 upon evaluation today patient actually appears to be doing very well in regard to his sacral wound. In fact this is much healthier the measurements not terribly different but again with a wound like this at home necessarily expect a a lot of change as far as the overall measurements are concerned in just 1 week's time. This is going be a much longer term process at this point. With that being said I do think that he is very healthy appearing as far as the base of the wound is concerned. 08/06/2019 upon evaluation today patient actually appears to be doing about the same with regard to his wound to be honest. There is really not a significant improvement overall based on what I am seeing today. Fortunately there is no signs of significant systemic infection. No fevers, chills, nausea, vomiting, or diarrhea. With that being said there is odor to the  drainage from the wound and subsequently also what appears to be increased drainage based on what we are seeing today as well as what his home health nurse called [place] about that she was concerned with as  well. 08/13/2019 upon evaluation today patient appears to be doing about the same at this point with regard to his wound although I think the dressing may be a little less drainage wise compared to what it was previous. Fortunately there is no signs of active infection at this time. No fevers, chills, nausea, vomiting, or diarrhea. He has been taking the antibiotic for only 3 days. 08/23/2019 upon evaluation today patient appears to be doing not nearly as well with regard to his wound. In general really not making a lot of progress which is unfortunate. There does not appear to be any signs of active infection at this time. No fevers, chills, nausea, vomiting, or diarrhea. With that being said the patient unfortunately does seem to be experiencing continued epiboly around the edges of the wound as well as significant scar tissue he has been in the majority of his day sitting in his chair which is likely a big reason for all of this. Fortunately there is no signs of active infection at this time. No fevers, chills, nausea, vomiting, or diarrhea. 08/29/2019 upon evaluation today patient's wound actually appears to be showing some signs of improvement. The region of few areas of new skin growth around the edges of the wound even where he has some of the epiboly. This is actually good news and I think that he has been very aggressive and offloading over the past week since we showed him the pictures of his wound last week. There still may be a chance that he is going require some sharp debridement to clear away some of these edges but to be honest I think that is can be quite an undertaking. The main reason is that he has a lot of thickened scar tissue and it is good to bleed quite significantly in my opinion.  The I think if organ to do that we may even need to have a portable or disposable cautery in order to make sure that we are able to get the area completely sealed up as far as bleeding is concerned. I do have one that we can utilize. However being that the wound looks so much better right now would like to give this 2 weeks to see how things stand and look at that point before making any additional recommendations. 09/12/2019 upon evaluation today patient appears to be making some progress as far as offloading is concerned. He still has a substantial wound but nonetheless he tells me he is off loading much more aggressively at this point. This is obviously good news. No fevers, chills, nausea, vomiting, or diarrhea. 4/22; I have not seen this patient in quite some time. No major change in the condition of the wound or its wound volume. Surrounding maceration of the skin around the edges of the wound. The wound is fairly substantial. T close to the anal opening to consider a wound VAC. He had extensive trials of plastic o surgery at [facility] which really does not result in any improvement. His wound bed is however clean 10/10/2019 upon evaluation today patient appears to be doing well with regard to the wound in the sacral region. He has been tolerating the dressing changes Bradley [name], Bradley [name] ([id number]) [id number] Page 11 of 16 without complication. Fortunately there is no signs of infection and he actually has some epithelial growth noted as well. He tells me has been trying to continue to offload this area which is excellent that is what he needs to do most as  far as what he can have in his control. Otherwise I feel like the collagen with a saline moistened gauze behind has been beneficial for him. 5/20; collagen and normal saline wet-to-dry. Although the tissue when this large sacral wound looks reasonable there is been absolutely no benefit to the overall closure. He  has macerated skin around this. He tells me that he spends a large part of the day up in the wheelchair. He still has home health 11/07/2019 upon evaluation today patient appears to be doing well with regard to his wounds at this point. Fortunately there is no signs of active infection at this time. Overall I feel like his sacral wound in general appears to be doing quite nicely. I am very pleased with overall how things are progressing and I feel like the patient is making excellent progress towards resolution. With that being said he still has a long ways to go before we expect to see this completely healed. 11/21/2019 upon evaluation today patient appears to be doing about the same in regard to his sacral wound. Unfortunately he is really not making much in the way of improvement when I questioned him about how much time he is really spending sitting on his bottom he tells me that the majority of his day is mainly in that position though he does spend some time in bed. It does not sound like it is enough however neither does it look like it is enough. 12/24/2019 upon evaluation today patient appears to be doing really about the same there is no significant improvement overall in his wound bed. Last time he was seen here we did have a discussion about the possibility of seeing if I can get a surgeon to evaluate and potentially clear away some of the thickened edges of the wound in order to allow this wound to potentially have a chance of closing with a wound VAC. Right now we could not even get a wound VAC to seal with some of the rolled and thickened edges. With that being said elected to the patient to think about this to discuss today. At this point the patient does want to see if I could make the referral to potentially see if the surgeon would be able to perform this and subsequently he agrees to go to a skilled nursing facility following if they are able to do this in order to allow for a wound VAC  and to get this wound to heal more effectively and quickly. In my opinion the only way that I would recommend that he see the surgeon to clear away the edges would be if he is also in agreement with going to skilled nursing facility as I know he cannot appropriately offload at home to take care of this and that setting. 01/07/2020 on evaluation today patient appears to be doing about the same in regard to his wound. There is no signs of active infection at this time which is great news. All in all he is really doing about the same. We have made referral to surgery to see if they can potentially perform debridement to clear away some of the scarred edging of the wound so we could potentially see about a wound VAC to try to help this heal more efficiently. Again we have discussed that if this occurs he would be in a skilled nursing facility following or release would need to be in order to appropriately offload. Nonetheless we are still in the process of working that  referral. 01/21/2020 upon evaluation today patient appears to be doing about the same in regard to his sacral wound. He did see Dr. Tonna Boehringer Dr. Geoffery Lyons feeling was that there was not anything surgically he could offer at this point as he did not feel like that a wound VAC would likely be appropriate for the patient. With that being said he did recommend potential for trying to get the patient into a skilled nursing facility as an outpatient and they will get me working on that as well. Fortunately there is no signs of active infection at this time. 11/10; this is a patient that has not been seen here in quite a bit of time while we were in our alternative venue. As well I have not seen this patient in probably over 2 years. As I remember things with him he has had this pressure ulcer encompassing his lower sacrum and coccyx and the surrounding buttocks for a prolonged period of time. When he first came here the surface looked fairly good and the  wound was smaller we attempted a wound VAC but we could never keep this in place. Because of nonresponse of the wound to topical dressings we referred him to [facility] plastic surgery where preparation was being made for flap closure. Intact he had a colostomy however after that he was deemed to have 2 large wound for flap closure. He did have osteomyelitis in the coccyx area in 2019. He is using wet-to-dry he has home health. They are managing his ostomy supplies as well. The wound is substantially larger than what I remember. Versus last time he was here to wound months or so ago he has an unstageable wound on the left upper thigh posteriorly. He tells me he is in his chair quite a bit especially recently 12/1; patient comes back to clinic. He did not get the lab work done apparently home health would not do this they referred him to the lab where he is apparently having a CBC checked because he is on iron infusions. We will try to get the lab work I ordered done there. He also did not get the x-ray he says he does not remember the discussion. The issue is that this large wound has probing areas to bone. He may have underlying osteomyelitis again he was treated for this previously I think in 2019 12/15; the patient did have lab work done. Notable for iron deficiency anemia with a hemoglobin of 9.7 although he is following with hematology for this his CRP was 7.1 which is up from his previous value. sedimentation rate of 91 which is also higher. However he maintains a relatively high sedimentation rate. Most disturbingly was an albumin of 2.8 although this is also somewhat higher than the 2.2 we had previously. With some difficulty I think a home health nurse actually made him some Dakin's solution he is using a Dakin's wet-to-dry to the large sacral wound. He has the area on the left upper thigh as well 06/17/2020; I follow this patient on a monthly basis. 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 At one point he had underlying osteomyelitis here and he received antibiotic therapy. I believe this was done through [facility] We have been using palliative Dakin's solution wet-to-dry on this which is really This very large wound about the same with healthy surface. No active infection that is obvious. He has developed an area on the left posterior thigh and now a  mirror image area on the right posterior thigh noted pressure injuries from the wheelchair. This occurs even though he has a Roho cushion which is apparently new I have been forwarded lab work from 05/15/2020. At that point his hemoglobin was 9.2 down from 13.8 on 10/5 this is microcytic hypochromic. He has known iron deficiency although the exact cause of this is never really been clear to me. He follows with hematology oncology and has been receiving IV iron he is supposed to follow-up in mid January and I told the patient this. From wound care point of view his albumin is 2.9 sedimentation rate is 74. His sedimentation rate has always been elevated in this range however his C- reactive protein was 82 which is way above anything previously measured. I see that on 05/08/2020 this was 7.1 I am not sure why the rapid discrepancy. He is supposed to have an x-ray of his lower sacrum looking for evidence of osteomyelitis that he was previously treated for. Is possible he would also require more advanced imaging such as another C CT or MRI He comes in also with paperwork for a Clinitron air-fluidized bed with a trapeze bar. I asked him if he would be able to transfer in and out of this bed . I am not opposed to ordering it, but I do not want to make his transfers more difficult than they already are. He has a Nurse, adult 07/15/2020; he is managed to get his Cl[place]ron air-fluidized bed. He has a new electric wheelchair with a wheelchair cushion. The x-ray I did showed some sacral bone destruction although this did not seem  progressive him last time. He was treated for osteomyelitis in this area previously in 2018. His sedimentation rate is 91 CRP 7.1 albumin 2.8. We are using Dakin's wet-to-dry. He has [facility] home health 4/6; 49-month follow-up. He has a Cl[place]ron air-fluidized bed. He has the wheelchair cushion. His major wound on the coccyx looks somewhat better including including both of [place] my nurse and my feeling that it is closed in somewhat. Wound is still too close to his anal opening to consider a wound VAC. At 1 point we were trying to get him into [facility] to get a flap closure but for 1 reason or another this never moved forward I was never really certain what the issue was. He has an area on the left upper thigh in the gluteal fold. This is a clean wound. 6/1; 87-month follow-up. I follow this man on a palliative basis he has a chronic stage IV wound over the lower sacrum coccyx and surrounding buttocks. He has an air-fluidized bed and a wheelchair cushion but he spends more time in the wheelchair than he does off this area by his own admission. I do not think the area is really changed that much although it is probably deeper in 1 section. He does tell us that he was discharged from home health because of lack of change of the wound. This was [employer]. He was treated for underlying osteomyelitis several years ago and he may have underlying chronic osteomyelitis. I do not believe he ever got the x-ray I ordered 8/3; patient presents for his 74-month follow-up. He reports no issues or complaints today. He continues to use Dakin's wet-to-dry dressings to his sacrum and Bradley [name], Bradley [name] ([id number]) [id number] Page 12 of 16 silver alginate to his left lateral thigh wound. He denies signs of infection. 10/5; patient presents for 27-month follow-up. He has no issues or  complaints today. He continues to use Dakin's wet-to-dry dressings to his sacrum. He states that the left lateral  thigh wound is healed. He denies signs of infection. 12/7; patient presents for 69-month follow-up. He states he would like to obtain a new air loss mattress bed. He states his current one is having technical difficulties. He currently is using Dakin's wet-to-dry dressings. He denies signs of infection. 2/1; patient presents for follow-up. He has no issues or complaints today. He currently uses Dakin's wet-to-dry dressings. He denies signs of infection. He has his new air loss mattress. 4/5; patient presents for follow-up. He has been using Dakin's wet-to-dry dressings. He has no issues or complaints today. He denies signs of infection. 11-10-2021 upon evaluation today patient's wound is doing better since I last saw him although it has been quite sometime since then to be perfectly honest. In fact I think it may have been even back in 2022 if I am not mistaken. Either way he has been seeing Dr. Mikey Bussing roughly every 2 months per his request. His wound still though smaller does have a significant amount of thick scar tissue surrounding the edge which is making it very difficult to heal to be honest. His son is performing the dressing changes at home currently. 8/23; patient presents for follow-up. He has no issues or complaints today. He has been doing Dakin's wet-to-dry dressings. He is not interested in any further surgical intervention for his sacral wound. He denies signs of infection. 11/15; Patient presents for follow up. He has been using Dakins wet to dry dressings. He has no issues or complaints. 2/7; patient presents for his regular 2-month follow-up. He continues to use Dakin's wet-to-dry dressings. He has no issues or complaints today. 4/10; patient presents for follow-up. He has been using Dakin's wet-to-dry dressings. He has no issues or complaints. He denies signs of infection. 6/12; patient presents for follow-up. He has been using Dakin's wet-to-dry dressings to the sacral wound. He has  a small area of skin breakdown to the left posterior leg and has been keeping this covered. He has no issues or complaints today. He denies signs of infection. 6/26; patient presents for follow-up. He has been using Dakin's wet-to-dry dressings to the sacral wound he now has another area of skin breakdown just above the previous left posterior leg wound. Appears to be from friction. He has been using Hydrofera Blue to these wounds. Insurance is asking patient to be seen weekly in order for him to keep his air mattress. After 5 weeks he can go out 2 months per patient Report. 7/3; patient presents for follow-up. He has been using Dakin's wet-to-dry dressings to the sacral wound and Hydrofera Blue to the left posterior leg wound. 7/10; patient presents for follow-up. He has been using Dakin's wet-to-dry dressings to the sacral wound and Hydrofera Blue with Medihoney to the left posterior leg wound. Wounds are stable. 7/17; patient presents for follow-up. He has been using Dakin's wet-to-dry dressings to the sacral wound and Hydrofera Blue with antibiotic ointment to the left posterior leg wound. He has been sitting up more in his chair this past week. He has no issues or complaints today. Patient History Information obtained from Patient. Family History Cancer - Mother, Heart Disease - Mother,Father, Seizures - Child, No family history of Diabetes, Hereditary Spherocytosis, Hypertension, Kidney Disease, Lung Disease, Stroke, Thyroid Problems, Tuberculosis. Social History Former smoker, Marital Status - Single, Alcohol Use - Never, Drug Use - No History, Caffeine Use -  Moderate - coffee. Medical History Eyes Denies history of Cataracts, Glaucoma, Optic Neuritis Hematologic/Lymphatic Patient has history of Anemia Denies history of Hemophilia, Human Immunodeficiency Virus, Lymphedema, Sickle Cell Disease Respiratory Denies history of Aspiration, Asthma, Chronic Obstructive Pulmonary Disease  (COPD), Pneumothorax, Sleep Apnea, Tuberculosis Cardiovascular Patient has history of Hypertension Denies history of Angina, Arrhythmia, Congestive Heart Failure, Coronary Artery Disease, Deep Vein Thrombosis, Hypotension, Myocardial Infarction, Peripheral Arterial Disease, Peripheral Venous Disease, Phlebitis, Vasculitis Gastrointestinal Denies history of Cirrhosis , Colitis, Crohns, Hepatitis A, Hepatitis B, Hepatitis C Endocrine Denies history of Type I Diabetes, Type II Diabetes Genitourinary Denies history of End Stage Renal Disease Immunological Denies history of Lupus Erythematosus, Raynauds, Scleroderma Integumentary (Skin) Patient has history of History of pressure wounds Musculoskeletal Patient has history of Rheumatoid Arthritis Denies history of Gout, Osteoarthritis, Osteomyelitis Neurologic Patient has history of Paraplegia Denies history of Dementia, Neuropathy, Quadriplegia, Seizure Disorder Oncologic Denies history of Received Chemotherapy, Received Radiation Psychiatric Denies history of Anorexia/bulimia, Confinement Anxiety Hospitalization/Surgery History - December 2020 [facility]. Medical A Surgical History Notes nd Gastrointestinal colostomy Genitourinary Bradley [name], Bradley [name] ([id number]) [id number] Page 13 of 16 frequent urination Objective Constitutional Vitals Time Taken: 2:58 PM, Temperature: 98 F, Pulse: 74 bpm, Respiratory Rate: 18 breaths/min, Blood Pressure: 117/70 mmHg. General Notes: Sacral ulcer: Granulation tissue throughout. Not all areas of the wound bed appears healthy. No probing to bone. No obvious signs of infection. Open wounds to the posterior left leg with granulation tissue and scant nonviable tissue. No signs of infection. Integumentary (Hair, Skin) Wound #10 status is Open. Original cause of wound was Pressure Injury. The date acquired was: 06/07/2015. The wound has been in treatment 179 weeks. The wound is located on  the Midline Sacrum. The wound measures 8cm length x 3.5cm width x 2.1cm depth; 21.991cm^2 area and 46.181cm^3 volume. There is muscle and Fat Layer (Subcutaneous Tissue) exposed. There is no tunneling or undermining noted. There is a medium amount of serosanguineous drainage noted. The wound margin is epibole. There is large (67-100%) red, [name] granulation within the wound bed. There is a small (1-33%) amount of necrotic tissue within the wound bed including Adherent Slough. Wound #14 status is Open. Original cause of wound was Shear/Friction. The date acquired was: 11/05/2022. The wound has been in treatment 5 weeks. The wound is located on the Left,Posterior Upper Leg. The wound measures 5.2cm length x 2.6cm width x 0.3cm depth; 10.619cm^2 area and 3.186cm^3 volume. There is Fat Layer (Subcutaneous Tissue) exposed. There is no tunneling or undermining noted. There is a medium amount of serous drainage noted. The wound margin is thickened. There is medium (34-66%) red, [name] granulation within the wound bed. There is a medium (34-66%) amount of necrotic tissue within the wound bed. Assessment Active Problems ICD-10 Pressure ulcer of sacral region, stage 4 Pressure ulcer of other site, stage 2 Unspecified open wound, left lower leg, initial encounter Other chronic osteomyelitis, other site Paraplegia, unspecified Patient's sacral wound is stable from last week. The left posterior leg wound has healthier tissue present today. I recommended continued course with Dakin's wet-to-dry dressings to the sacral wound and antibiotic ointment with blue to the left posterior leg wound. Over the course of his admission with me the sacral wound has improved in size and appearance. This is due to his ongoing wound care and aggressive offloading especially with his air mattress. Having a Radi-Op-Mattress system is medically necessary to help prevent future hospitalization from worsening wounds due to pressure. We  also discussed the  importance of turning and repositioning every 2 hours and optimizing patient's nutritional status with increased protein intake. Plan Follow-up Appointments: Other: - 2 months palliative care Bathing/ Shower/ Hygiene: May shower; gently cleanse wound with antibacterial soap, rinse and pat dry prior to dressing wounds Off-Loading: Roho cushion for wheelchair Low air-loss mattress (Group 2) Turn and reposition every 2 hours WOUND #10: - Sacrum Wound Laterality: Midline Cleanser: Dakin 16 (oz) 0.25 1 x Per Day/30 Days Discharge Instructions: Use as directed. Prim Dressing: Gauze (Generic) 1 x Per Day/30 Days ary Discharge Instructions: As directed: moistened with Dakins Solution Secondary Dressing: Zetuvit Plus 4x4 (in/in) 1 x Per Day/30 Days Secured With: Medipore T - 43M Medipore H Soft Cloth Surgical T ape ape, 2x2 (in/yd) (Dispense As Written) 1 x Per Day/30 Days WOUND #14: - Upper Leg Wound Laterality: Left, Posterior Topical: Mupirocin Ointment 1 x Per Day/30 Days Discharge Instructions: Apply as directed by provider. Prim Dressing: Hydrofera Blue Ready Transfer Foam, 2.5x2.5 (in/in) (Generic) 1 x Per Day/30 Days ary Discharge Instructions: Apply Hydrofera Blue Ready to wound bed as directed Secured With: T egaderm Film Transparent 6x8 (in/in) 1 x Per Day/30 Days Discharge Instructions: Apply to wound bed MEKAI, WILKINSON ([id number]) [id number] Page 14 of 16 1. Dakin's wet-to-dry dressings to the sacral wound 2. Antibiotic ointment with Hydrofera Blue to the left posterior leg wound 3. Follow-up in 1 week 4. Aggressive offloading Electronic Signature(s) Signed: 12/21/2022 3:56:23 PM By: Bradley Corwin DO Entered By: Bradley [name] on 12/21/2022 15:51:32 -------------------------------------------------------------------------------- ROS/PFSH Details Patient Name: Date of Service: Bradley [name] Bradley A. 12/21/2022 2:45 PM Medical  Record Number: [id number] Patient Account Number: [id number] Date of Birth/Sex: Treating RN: 06-Aug-1945 (77 y.o. Bradley [name] Primary Care Provider: Marisue [name] Other Clinician: Betha [name] Referring Provider: Treating Provider/Extender: Bradley [name] in Treatment: 179 Label Progress Note Print Version as History and Physical for this encounter Information Obtained From Patient Eyes Medical History: Negative for: Cataracts; Glaucoma; Optic Neuritis Hematologic/Lymphatic Medical History: Positive for: Anemia Negative for: Hemophilia; Human Immunodeficiency Virus; Lymphedema; Sickle Cell Disease Respiratory Medical History: Negative for: Aspiration; Asthma; Chronic Obstructive Pulmonary Disease (COPD); Pneumothorax; Sleep Apnea; Tuberculosis Cardiovascular Medical History: Positive for: Hypertension Negative for: Angina; Arrhythmia; Congestive Heart Failure; Coronary Artery Disease; Deep Vein Thrombosis; Hypotension; Myocardial Infarction; Peripheral Arterial Disease; Peripheral Venous Disease; Phlebitis; Vasculitis Gastrointestinal Medical History: Negative for: Cirrhosis ; Colitis; Crohns; Hepatitis A; Hepatitis B; Hepatitis C Past Medical History Notes: colostomy Endocrine Medical History: Negative for: Type I Diabetes; Type II Diabetes Genitourinary Medical History: Negative for: End Stage Renal Disease Past Medical History NotesMarland Kitchen AMOGH, KOMATSU ([id number]) [id number] Page 15 of 16 frequent urination Immunological Medical History: Negative for: Lupus Erythematosus; Raynauds; Scleroderma Integumentary (Skin) Medical History: Positive for: History of pressure wounds Musculoskeletal Medical History: Positive for: Rheumatoid Arthritis Negative for: Gout; Osteoarthritis; Osteomyelitis Neurologic Medical History: Positive for: Paraplegia Negative for: Dementia; Neuropathy; Quadriplegia; Seizure  Disorder Oncologic Medical History: Negative for: Received Chemotherapy; Received Radiation Psychiatric Medical History: Negative for: Anorexia/bulimia; Confinement Anxiety Immunizations Pneumococcal Vaccine: Received Pneumococcal Vaccination: No Immunization Notes: up to date Implantable Devices None Hospitalization / Surgery History Type of Hospitalization/Surgery December 2020 [facility] Family and Social History Cancer: Yes - Mother; Diabetes: No; Heart Disease: Yes - Mother,Father; Hereditary Spherocytosis: No; Hypertension: No; Kidney Disease: No; Lung Disease: No; Seizures: Yes - Child; Stroke: No; Thyroid Problems: No; Tuberculosis: No; Former smoker; Marital Status - Single; Alcohol Use: Never; Drug Use: No History; Caffeine Use: Moderate -  coffee; Financial Concerns: No; Food, Clothing or Shelter Needs: No; Support System Lacking: No; Transportation Concerns: No Electronic Signature(s) Signed: 12/21/2022 3:56:23 PM By: Bradley Corwin DO Signed: 12/21/2022 4:35:37 PM By: Angelina Pih Entered By: Bradley [name] on 12/21/2022 15:55:42 -------------------------------------------------------------------------------- SuperBill Details Patient Name: Date of Service: Bradley [name] Bradley A. 12/21/2022 Medical Record Number: [id number] Patient Account Number: [id number] NYSHAWN, GOWDY ([id number]) [id number] Page 16 of 16 Date of Birth/Sex: Treating RN: 1945/11/22 (77 y.o. Bradley [name] Primary Care Provider: Marisue [name] Other Clinician: Betha [name] Referring Provider: Treating Provider/Extender: Bradley [name] in Treatment: 179 Diagnosis Coding ICD-10 Codes Code Description L89.154 Pressure ulcer of sacral region, stage 4 L89.892 Pressure ulcer of other site, stage 2 S81.802A Unspecified open wound, left lower leg, initial encounter M86.68 Other chronic osteomyelitis, other site G82.20 Paraplegia,  unspecified Facility Procedures : CPT4 Code: 40102725 Description: 99214 - WOUND CARE VISIT-LEV 4 EST PT Modifier: Quantity: 1 Physician Procedures : CPT4 Code Description Modifier [id number] - WC PHYS LEVEL 3 - EST PT ICD-10 Diagnosis Description L89.154 Pressure ulcer of sacral region, stage 4 L89.892 Pressure ulcer of other site, stage 2 S81.802A Unspecified open wound, left lower leg, initial  encounter M86.68 Other chronic osteomyelitis, other site Quantity: 1 Electronic Signature(s) Signed: 12/21/2022 3:56:23 PM By: Bradley Corwin DO Entered By: Bradley [name] on 12/21/2022 15:51:40

## 2022-12-22 NOTE — Progress Notes (Signed)
Bradley [name], Bradley [name] ([id number]) [id number] Page 1 of 8 Visit Report for 12/21/2022 Arrival Information Details Patient Name: Date of Service: Bradley [name] [name] 12/21/2022 2:45 PM Medical Record Number: [id number] Patient Account Number: [id number] Date of Birth/Sex: Treating RN: November 27, 1945 (77 y.o. Bradley [name] Primary Care Koltin Wehmeyer: Marisue Ivan Other Clinician: Referring Nagee Goates: Treating Dabney Dever/Extender: Greta Doom in Treatment: 179 Visit Information History Since Last Visit Added or deleted any medications: No Patient Arrived: Wheel Chair Any new allergies or adverse reactions: No Arrival Time: 14:57 Had [name] fall or experienced change in No Accompanied By: self activities of daily living that may affect Transfer Assistance: Michiel Sites Lift risk of falls: Patient Identification Verified: Yes Hospitalized since last visit: No Secondary Verification Process Completed: Yes Has Dressing in Place as Prescribed: Yes Patient Requires Transmission-Based Precautions: No Pain Present Now: No Patient Has Alerts: No Electronic Signature(s) Signed: 12/21/2022 4:35:37 PM By: Angelina Pih Entered By: Angelina Pih on 12/21/2022 14:58:01 -------------------------------------------------------------------------------- Clinic Level of Care Assessment Details Patient Name: Date of Service: Bradley [name] [name] 12/21/2022 2:45 PM Medical Record Number: [id number] Patient Account Number: [id number] Date of Birth/Sex: Treating RN: [birth year] (77 y.o. Bradley [name] Primary Care Cashlynn Yearwood: Marisue Ivan Other Clinician: Betha Loa Referring Irish Piech: Treating Ledonna Dormer/Extender: Greta Doom in Treatment: 179 Clinic Level of Care Assessment Items TOOL 4 Quantity Score []  - 0 Use when only an EandM is performed on FOLLOW-UP visit ASSESSMENTS - Nursing Assessment / Reassessment X- 1  10 Reassessment of Co-morbidities (includes updates in patient status) X- 1 5 Reassessment of Adherence to Treatment Plan ASSESSMENTS - Wound and Skin [name] ssessment / Reassessment []  - 0 Simple Wound Assessment / Reassessment - one wound X- 2 5 Complex Wound Assessment / Reassessment - multiple wounds Bradley [name], Bradley [name] ([id number]) [id number] Page 2 of 8 []  - 0 Dermatologic / Skin Assessment (not related to wound area) ASSESSMENTS - Focused Assessment []  - 0 Circumferential Edema Measurements - multi extremities []  - 0 Nutritional Assessment / Counseling / Intervention []  - 0 Lower Extremity Assessment (monofilament, tuning fork, pulses) []  - 0 Peripheral Arterial Disease Assessment (using hand held doppler) ASSESSMENTS - Ostomy and/or Continence Assessment and Care []  - 0 Incontinence Assessment and Management []  - 0 Ostomy Care Assessment and Management (repouching, etc.) PROCESS - Coordination of Care X - Simple Patient / Family Education for ongoing care 1 15 []  - 0 Complex (extensive) Patient / Family Education for ongoing care []  - 0 Staff obtains Chiropractor, Records, T Results / Process Orders est []  - 0 Staff telephones HHA, Nursing Homes / Clarify orders / etc []  - 0 Routine Transfer to another Facility (non-emergent condition) []  - 0 Routine Hospital Admission (non-emergent condition) []  - 0 New Admissions / Manufacturing engineer / Ordering NPWT Apligraf, etc. , []  - 0 Emergency Hospital Admission (emergent condition) X- 1 10 Simple Discharge Coordination []  - 0 Complex (extensive) Discharge Coordination PROCESS - Special Needs []  - 0 Pediatric / Minor Patient Management []  - 0 Isolation Patient Management []  - 0 Hearing / Language / Visual special needs []  - 0 Assessment of Community assistance (transportation, D/C planning, etc.) []  - 0 Additional assistance / Altered mentation []  - 0 Support Surface(s) Assessment (bed,  cushion, seat, etc.) INTERVENTIONS - Wound Cleansing / Measurement []  - 0 Simple Wound Cleansing - one wound X- 2 5 Complex Wound Cleansing - multiple wounds X- 1 5 Wound Imaging (photographs - any number of wounds) []  -  0 Wound Tracing (instead of photographs) []  - 0 Simple Wound Measurement - one wound X- 2 5 Complex Wound Measurement - multiple wounds INTERVENTIONS - Wound Dressings []  - 0 Small Wound Dressing one or multiple wounds X- 2 15 Medium Wound Dressing one or multiple wounds []  - 0 Large Wound Dressing one or multiple wounds X- 1 5 Application of Medications - topical []  - 0 Application of Medications - injection INTERVENTIONS - Miscellaneous []  - 0 External ear exam []  - 0 Specimen Collection (cultures, biopsies, blood, body fluids, etc.) []  - 0 Specimen(s) / Culture(s) sent or taken to Lab for analysis Bradley [name], Bradley [name] ([id number]) [id number] Page 3 of 8 X- 1 10 Patient Transfer (multiple staff / Nurse, adult / Similar devices) []  - 0 Simple Staple / Suture removal (25 or less) []  - 0 Complex Staple / Suture removal (26 or more) []  - 0 Hypo / Hyperglycemic Management (close monitor of Blood Glucose) []  - 0 Ankle / Brachial Index (ABI) - do not check if billed separately X- 1 5 Vital Signs Has the patient been seen at the hospital within the last three years: Yes Total Score: 125 Level Of Care: New/Established - Level 4 Electronic Signature(s) Signed: 12/22/2022 12:00:53 PM By: Betha Loa Entered By: Betha Loa on 12/21/2022 15:21:24 -------------------------------------------------------------------------------- Lower Extremity Assessment Details Patient Name: Date of Service: Bradley [name] [name] 12/21/2022 2:45 PM Medical Record Number: [id number] Patient Account Number: [id number] Date of Birth/Sex: Treating RN: [birth year] (77 y.o. Bradley [name] Primary Care Andrw Mcguirt: Marisue Ivan Other  Clinician: Referring Michille Mcelrath: Treating Zachary Nole/Extender: Claudie Fisherman Weeks in Treatment: 179 Electronic Signature(s) Signed: 12/21/2022 4:35:37 PM By: Angelina Pih Entered By: Angelina Pih on 12/21/2022 15:12:09 -------------------------------------------------------------------------------- Multi Wound Chart Details Patient Name: Date of Service: Bradley [name] [name] 12/21/2022 2:45 PM Medical Record Number: [id number] Patient Account Number: [id number] Date of Birth/Sex: Treating RN: May 05, 1946 (77 y.o. Bradley [name] Primary Care Phoebie Shad: Marisue Ivan Other Clinician: Referring Dinesha Twiggs: Treating Cornelia Walraven/Extender: Greta Doom in Treatment: 179 Vital Signs Height(in): Pulse(bpm): 74 Weight(lbs): Blood Pressure(mmHg): 117/70 Body Mass Index(BMI): Temperature(F): 98 Respiratory Rate(breaths/min): 18 Bradley [name], Bradley [name] ([id number]) [id number] Page 4 of 8 [10:Photos:] [[name]:[name]] Midline Sacrum Left, Posterior Upper Leg [name] Wound Location: Pressure Injury Shear/Friction [name] Wounding Event: Pressure Ulcer Pressure Ulcer [name] Primary Etiology: Anemia, Hypertension, History of Anemia, Hypertension, History of [name] Comorbid History: pressure wounds, Rheumatoid Arthritis, pressure wounds, Rheumatoid Arthritis, Paraplegia Paraplegia 06/07/2015 11/05/2022 [name] Date Acquired: 179 5 [name] Weeks of Treatment: Open Open [name] Wound Status: No No [name] Wound Recurrence: No Yes [name] Clustered Wound: [name] 2 [name] Clustered Quantity: 8x3.5x2.1 5.2x2.6x0.3 [name] Measurements L x W x D (cm) 21.991 10.619 [name] [name] (cm) : rea 46.181 3.186 [name] Volume (cm) : 57.30% -400.70% [name] % Reduction in [name] rea: 85.10% -15071.40% [name] % Reduction in Volume: Category/Stage IV Category/Stage II [name] Classification: Medium Medium [name] Exudate [name] mount: Serosanguineous Serous [name] Exudate Type: red, brown amber [name] Exudate  Color: Epibole Thickened [name] Wound Margin: Large (67-100%) Medium (34-66%) [name] Granulation [name] mount: Red, [name] Red, [name] [name] Granulation Quality: Small (1-33%) Medium (34-66%) [name] Necrotic [name] mount: Fat Layer (Subcutaneous Tissue): Yes Fat Layer (Subcutaneous Tissue): Yes [name] Exposed Structures: Muscle: Yes Fascia: No Fascia: No Tendon: No Tendon: No Muscle: No Joint: No Joint: No Bone: No Bone: No None [name] [name] Epithelialization: Treatment Notes Electronic Signature(s) Signed: 12/21/2022 4:35:37 PM By: Angelina Pih Entered By: Angelina Pih on 12/21/2022 15:12:54 --------------------------------------------------------------------------------  Multi-Disciplinary Care Plan Details Patient Name: Date of Service: Bradley [name] [name] 12/21/2022 2:45 PM Medical Record Number: [id number] Patient Account Number: [id number] Date of Birth/Sex: Treating RN: August 03, 1945 (77 y.o. Bradley [name] Primary Care Huong Luthi: Marisue Ivan Other Clinician: Betha Loa Referring Ozetta Flatley: Treating Tanita Palinkas/Extender: Greta Doom in Treatment: [address] [place], HINNANT [name] ([id number]) [id number] Page 5 of 8 Electronic Signature(s) Signed: 12/21/2022 4:35:37 PM By: Angelina Pih Signed: 12/22/2022 12:00:53 PM By: Betha Loa Entered By: Betha Loa on 12/21/2022 15:21:36 -------------------------------------------------------------------------------- Pain Assessment Details Patient Name: Date of Service: Bradley [name] [name] 12/21/2022 2:45 PM Medical Record Number: [id number] Patient Account Number: [id number] Date of Birth/Sex: Treating RN: 1946-03-18 (77 y.o. Bradley [name] Primary Care Jeron Grahn: Marisue Ivan Other Clinician: Referring Young Mulvey: Treating Adalind Weitz/Extender: Greta Doom in Treatment: 179 Active Problems Location of Pain Severity and Description of  Pain Patient Has Paino No Site Locations Rate the pain. Current Pain Level: 0 Pain Management and Medication Current Pain Management: Electronic Signature(s) Signed: 12/21/2022 4:35:37 PM By: Angelina Pih Entered By: Angelina Pih on 12/21/2022 14:58:42 Wound Assessment Details -------------------------------------------------------------------------------- Bradley [name] ([id number]) [id number] Page 6 of 8 Patient Name: Date of Service: Bradley [name] [name] 12/21/2022 2:45 PM Medical Record Number: [id number] Patient Account Number: [id number] Date of Birth/Sex: Treating RN: 03-09-1946 (77 y.o. Bradley [name] Primary Care Niva Murren: Marisue Ivan Other Clinician: Referring Josiephine Simao: Treating Anishka Bushard/Extender: Claudie Fisherman Weeks in Treatment: 179 Wound Status Wound Number: 10 Primary Pressure Ulcer Etiology: Wound Location: Midline Sacrum Wound Open Wounding Event: Pressure Injury Status: Date Acquired: 06/07/2015 Comorbid Anemia, Hypertension, History of pressure wounds, Weeks Of Treatment: 179 History: Rheumatoid Arthritis, Paraplegia Clustered Wound: No Photos Wound Measurements Length: (cm) 8 % Reduction in Area: 57.3% Width: (cm) 3.5 % Reduction in Volume: 85.1% Depth: (cm) 2.1 Epithelialization: None Area: (cm) 21.991 Tunneling: No Volume: (cm) 46.181 Undermining: No Wound Description Classification: Category/Stage IV Foul Odor After Cleansing: No Wound Margin: Epibole Slough/Fibrino Yes Exudate Amount: Medium Exudate Type: Serosanguineous Exudate Color: red, brown Wound Bed Granulation Amount: Large (67-100%) Exposed Structure Granulation Quality: Red, [name] Fascia Exposed: No Necrotic Amount: Small (1-33%) Fat Layer (Subcutaneous Tissue) Exposed: Yes Necrotic Quality: Adherent Slough Tendon Exposed: No Muscle Exposed: Yes Necrosis of Muscle: No Joint Exposed: No Bone Exposed: No Electronic  Signature(s) Signed: 12/21/2022 4:35:37 PM By: Angelina Pih Entered By: Angelina Pih on 12/21/2022 15:11:59 Wound Assessment Details -------------------------------------------------------------------------------- Bradley [name] ([id number]) [id number] Page 7 of 8 Patient Name: Date of Service: Bradley [name] [name] 12/21/2022 2:45 PM Medical Record Number: [id number] Patient Account Number: [id number] Date of Birth/Sex: Treating RN: 03-06-1946 (77 y.o. Bradley [name] Primary Care Casady Voshell: Marisue Ivan Other Clinician: Referring Deborah Dondero: Treating Jordon Kristiansen/Extender: Claudie Fisherman Weeks in Treatment: 179 Wound Status Wound Number: 14 Primary Pressure Ulcer Etiology: Wound Location: Left, Posterior Upper Leg Wound Open Wounding Event: Shear/Friction Status: Date Acquired: 11/05/2022 Comorbid Anemia, Hypertension, History of pressure wounds, Weeks Of Treatment: 5 History: Rheumatoid Arthritis, Paraplegia Clustered Wound: Yes Photos Wound Measurements Length: (cm) 5.2 % Reduction in Area: -400.7% Width: (cm) 2.6 % Reduction in Volume: -15071.4% Depth: (cm) 0.3 Tunneling: No Clustered Quantity: 2 Undermining: No Area: (cm) 10.619 Volume: (cm) 3.186 Wound Description Classification: Category/Stage II Foul Odor After Cleansing: No Wound Margin: Thickened Slough/Fibrino No Exudate Amount: Medium Exudate Type: Serous Exudate Color: amber Wound Bed Granulation Amount: Medium (34-66%) Exposed Structure Granulation Quality: Red, [name] Fascia Exposed: No Necrotic  Amount: Medium (34-66%) Fat Layer (Subcutaneous Tissue) Exposed: Yes Tendon Exposed: No Muscle Exposed: No Joint Exposed: No Bone Exposed: No Electronic Signature(s) Signed: 12/21/2022 4:35:37 PM By: Angelina Pih Entered By: Angelina Pih on 12/21/2022 15:10:58 Vitals  Details -------------------------------------------------------------------------------- Bradley [name] ([id number]) [id number] Page 8 of 8 Patient Name: Date of Service: Bradley [name] [name] 12/21/2022 2:45 PM Medical Record Number: [id number] Patient Account Number: [id number] Date of Birth/Sex: Treating RN: [birth year] (77 y.o. Bradley [name] Primary Care Ramin Zoll: Marisue Ivan Other Clinician: Referring Meryle Pugmire: Treating Kamarie Palma/Extender: Greta Doom in Treatment: 179 Vital Signs Time Taken: 14:58 Temperature (F): 98 Pulse (bpm): 74 Respiratory Rate (breaths/min): 18 Blood Pressure (mmHg): 117/70 Reference Range: 80 - 120 mg / dl Electronic Signature(s) Signed: 12/21/2022 4:35:37 PM By: Angelina Pih Entered By: Angelina Pih on 12/21/2022 14:58:16

## 2022-12-26 DIAGNOSIS — N319 Neuromuscular dysfunction of bladder, unspecified: Secondary | ICD-10-CM | POA: Diagnosis not present

## 2022-12-27 NOTE — Progress Notes (Signed)
Bradley [name], Bradley [name] ([id number]) [id number] Page 1 of 9 Visit Report for 12/14/2022 Arrival Information Details Patient Name: Date of Service: Bradley [name] [name] 12/14/2022 2:45 PM Medical Record Number: [id number] Patient Account Number: [id number] Date of Birth/Sex: Treating RN: Oct 05, 1945 (77 y.o. Bradley [name] Primary Care Bradley [name]: Bradley [name] Other Clinician: Betha [name] Referring Bradley [name]: Treating Bradley [name]/Extender: Bradley [name] in Treatment: 178 Visit Information History Since Last Visit All ordered tests and consults were completed: No Patient Arrived: Wheel Chair Added or deleted any medications: No Arrival Time: 15:06 Any new allergies or adverse reactions: No Transfer Assistance: Bradley [name] Lift Had [name] fall or experienced change in No Patient Identification Verified: Yes activities of daily living that may affect Secondary Verification Process Completed: Yes risk of falls: Patient Requires Transmission-Based Precautions: No Signs or symptoms of abuse/neglect since last visito No Patient Has Alerts: No Hospitalized since last visit: No Implantable device outside of the clinic excluding No cellular tissue based products placed in the center since last visit: Has Dressing in Place as Prescribed: Yes Pain Present Now: No Electronic Signature(s) Signed: 12/19/2022 4:35:54 PM By: Bradley [name] Entered By: Bradley [name] on 12/14/2022 15:09:57 -------------------------------------------------------------------------------- Clinic Level of Care Assessment Details Patient Name: Date of Service: Bradley [name] [name] 12/14/2022 2:45 PM Medical Record Number: [id number] Patient Account Number: [id number] Date of Birth/Sex: Treating RN: 01/10/1946 (77 y.o. Bradley [name] Primary Care Bradley [name]: Bradley [name] Other Clinician: Betha [name] Referring Bradley [name]: Treating Bradley [name]/Extender: Bradley [name] in Treatment: 178 Clinic Level of Care Assessment Items TOOL 4 Quantity Score []  - 0 Use when only an EandM is performed on FOLLOW-UP visit ASSESSMENTS - Nursing Assessment / Reassessment X- 1 10 Reassessment of Co-morbidities (includes updates in patient status) X- 1 5 Reassessment of Adherence to Treatment Plan Bradley [name] ([id number]) [id number] Page 2 of 9 ASSESSMENTS - Wound and Skin [name] ssessment / Reassessment []  - 0 Simple Wound Assessment / Reassessment - one wound X- 2 5 Complex Wound Assessment / Reassessment - multiple wounds []  - 0 Dermatologic / Skin Assessment (not related to wound area) ASSESSMENTS - Focused Assessment []  - 0 Circumferential Edema Measurements - multi extremities []  - 0 Nutritional Assessment / Counseling / Intervention []  - 0 Lower Extremity Assessment (monofilament, tuning fork, pulses) []  - 0 Peripheral Arterial Disease Assessment (using hand held doppler) ASSESSMENTS - Ostomy and/or Continence Assessment and Care []  - 0 Incontinence Assessment and Management []  - 0 Ostomy Care Assessment and Management (repouching, etc.) PROCESS - Coordination of Care X - Simple Patient / Family Education for ongoing care 1 15 []  - 0 Complex (extensive) Patient / Family Education for ongoing care []  - 0 Staff obtains Chiropractor, Records, T Results / Process Orders est []  - 0 Staff telephones HHA, Nursing Homes / Clarify orders / etc []  - 0 Routine Transfer to another Facility (non-emergent condition) []  - 0 Routine Hospital Admission (non-emergent condition) []  - 0 New Admissions / Manufacturing engineer / Ordering NPWT Apligraf, etc. , []  - 0 Emergency Hospital Admission (emergent condition) X- 1 10 Simple Discharge Coordination []  - 0 Complex (extensive) Discharge Coordination PROCESS - Special Needs []  - 0 Pediatric / Minor Patient Management []  - 0 Isolation Patient Management []  - 0 Hearing  / Language / Visual special needs []  - 0 Assessment of Community assistance (transportation, D/C planning, etc.) []  - 0 Additional assistance / Altered mentation []  - 0 Support Surface(s) Assessment (bed, cushion, seat, etc.) INTERVENTIONS -  Wound Cleansing / Measurement []  - 0 Simple Wound Cleansing - one wound X- 2 5 Complex Wound Cleansing - multiple wounds X- 1 5 Wound Imaging (photographs - any number of wounds) []  - 0 Wound Tracing (instead of photographs) []  - 0 Simple Wound Measurement - one wound X- 2 5 Complex Wound Measurement - multiple wounds INTERVENTIONS - Wound Dressings []  - 0 Small Wound Dressing one or multiple wounds X- 2 15 Medium Wound Dressing one or multiple wounds []  - 0 Large Wound Dressing one or multiple wounds X- 1 5 Application of Medications - topical []  - 0 Application of Medications - injection INTERVENTIONS - Miscellaneous []  - 0 External ear exam Bradley [name], Bradley [name] ([id number]) [id number] Page 3 of 9 []  - 0 Specimen Collection (cultures, biopsies, blood, body fluids, etc.) []  - 0 Specimen(s) / Culture(s) sent or taken to Lab for analysis X- 1 10 Patient Transfer (multiple staff / Bradley [name] Lift / Similar devices) []  - 0 Simple Staple / Suture removal (25 or less) []  - 0 Complex Staple / Suture removal (26 or more) []  - 0 Hypo / Hyperglycemic Management (close monitor of Blood Glucose) []  - 0 Ankle / Brachial Index (ABI) - do not check if billed separately X- 1 5 Vital Signs Has the patient been seen at the hospital within the last three years: Yes Total Score: 125 Level Of Care: New/Established - Level 4 Electronic Signature(s) Signed: 12/19/2022 4:35:54 PM By: Bradley [name] Entered By: Bradley [name] on 12/14/2022 15:44:56 -------------------------------------------------------------------------------- Encounter Discharge Information Details Patient Name: Date of Service: Bradley [name] [name] 12/14/2022  2:45 PM Medical Record Number: [id number] Patient Account Number: [id number] Date of Birth/Sex: Treating RN: 03-27-1946 (77 y.o. Bradley [name], Bradley [name] Primary Care Francille Wittmann: Bradley [name] Other Clinician: Betha [name] Referring Johniece Hornbaker: Treating Orley Lawry/Extender: Bradley [name] in Treatment: [phone] Encounter Discharge Information Items Discharge Condition: Stable Ambulatory Status: Wheelchair Discharge Destination: Home Transportation: Other Accompanied By: self Schedule Follow-up Appointment: Yes Clinical Summary of Care: Electronic Signature(s) Signed: 12/19/2022 4:35:54 PM By: Bradley [name] Entered By: Bradley [name] on 12/14/2022 17:33:13 Lower Extremity Assessment Details -------------------------------------------------------------------------------- Bradley [name] ([id number]) [id number] Page 4 of 9 Patient Name: Date of Service: Bradley [name] [name] 12/14/2022 2:45 PM Medical Record Number: [id number] Patient Account Number: [id number] Date of Birth/Sex: Treating RN: [birth year] (77 y.o. Bradley [name] Primary Care Felipe Paluch: Bradley [name] Other Clinician: Betha [name] Referring Marl Seago: Treating Kutter Schnepf/Extender: Bradley [name] in Treatment: 178 Electronic Signature(s) Signed: 12/19/2022 4:35:54 PM By: Bradley [name] Signed: 12/27/2022 8:06:11 AM By: Bradley [name], BSN, RN, CWS, Kim RN, BSN Entered By: Bradley [name] on 12/14/2022 15:31:53 -------------------------------------------------------------------------------- Multi Wound Chart Details Patient Name: Date of Service: Bradley [name] [name] 12/14/2022 2:45 PM Medical Record Number: [id number] Patient Account Number: [id number] Date of Birth/Sex: Treating RN: 03-29-1946 (77 y.o. Bradley [name], Bradley [name] Primary Care Cillian Gwinner: Bradley [name] Other Clinician: Betha [name] Referring Khaila Velarde: Treating Jaquelin Meaney/Extender: Bradley [name] in Treatment: 178 Vital Signs Height(in): Pulse(bpm): 84 Weight(lbs): Blood Pressure(mmHg): 114/66 Body Mass Index(BMI): Temperature(F): 98.5 Respiratory Rate(breaths/min): 16 [10:Photos:] [[name]:[name]] Midline Sacrum Left, Posterior Upper Leg [name] Wound Location: Pressure Injury Shear/Friction [name] Wounding Event: Pressure Ulcer Pressure Ulcer [name] Primary Etiology: Anemia, Hypertension, History of Anemia, Hypertension, History of [name] Comorbid History: pressure wounds, Rheumatoid Arthritis, pressure wounds, Rheumatoid Arthritis, Paraplegia Paraplegia 06/07/2015 11/05/2022 [name] Date Acquired: 178 4 [name] Weeks of Treatment: Open Open [name] Wound Status: No No [name] Wound Recurrence: No Yes [name] Clustered  Wound: [name] 2 [name] Clustered Quantity: 8x3.5x2.4 5x3x0.2 [name] Measurements L x W x D (cm) 21.991 11.781 [name] [name] (cm) : rea 52.779 2.356 [name] Volume (cm) : 57.30% -455.40% [name] % Reduction in [name] rea: 82.90% -11119.00% [name] % Reduction in Volume: Category/Stage IV Category/Stage II [name] Classification: Medium Medium [name] Exudate [name] mount: Serosanguineous Serous [name] Exudate Type: red, brown amber [name] Exudate Color: Epibole Thickened [name] Wound Margin: Large (67-100%) Large (67-100%) [name] Granulation [name] mount: Red, [name] Red, [name] [name] Granulation QualityDILLIN, Bradley [name] ([id number]) [id number] Page 5 of 9 Small (1-33%) None Present (0%) [name] Necrotic Amount: Fat Layer (Subcutaneous Tissue): Yes Fat Layer (Subcutaneous Tissue): Yes [name] Exposed Structures: Muscle: Yes Fascia: No Fascia: No Tendon: No Tendon: No Muscle: No Joint: No Joint: No Bone: No Bone: No None [name] [name] Epithelialization: Treatment Notes Electronic Signature(s) Signed: 12/19/2022 4:35:54 PM By: Bradley [name] Entered By: Bradley [name] on 12/14/2022 15:31:57 -------------------------------------------------------------------------------- Multi-Disciplinary Care Plan  Details Patient Name: Date of Service: Bradley [name] [name] 12/14/2022 2:45 PM Medical Record Number: [id number] Patient Account Number: [id number] Date of Birth/Sex: Treating RN: [birth year] (77 y.o. Bradley [name] Primary Care Xayden Linsey: Bradley [name] Other Clinician: Betha [name] Referring Walburga Hudman: Treating Roxsana Riding/Extender: Bradley [name] in Treatment: [phone] Active Inactive Electronic Signature(s) Signed: 12/19/2022 4:35:54 PM By: Bradley [name] Signed: 12/27/2022 8:06:11 AM By: Bradley [name], BSN, RN, CWS, Kim RN, BSN Entered By: Bradley [name] on 12/14/2022 17:32:15 -------------------------------------------------------------------------------- Pain Assessment Details Patient Name: Date of Service: Bradley [name] [name] 12/14/2022 2:45 PM Medical Record Number: [id number] Patient Account Number: [id number] Date of Birth/Sex: Treating RN: Aug 31, 1945 (77 y.o. Bradley [name] Primary Care Letishia Elliott: Bradley [name] Other Clinician: Betha [name] Referring Sy Saintjean: Treating Anwen Cannedy/Extender: Bradley [name] in Treatment: [phone] Active Problems Location of Pain Severity and Description of Pain Patient Has Paino No Site Locations Bradley [name], Bradley [name] ([id number]) [id number] Page 6 of 9 Pain Management and Medication Current Pain Management: Electronic Signature(s) Signed: 12/19/2022 4:35:54 PM By: Bradley [name] Signed: 12/27/2022 8:06:11 AM By: Bradley [name], BSN, RN, CWS, Kim RN, BSN Entered By: Bradley [name] on 12/14/2022 15:13:07 -------------------------------------------------------------------------------- Patient/Caregiver Education Details Patient Name: Date of Service: Bradley [name] [name] 7/10/2024andnbsp2:45 PM Medical Record Number: [id number] Patient Account Number: [id number] Date of Birth/Gender: Treating RN: 11-20-1945 (77 y.o. Bradley [name] Primary Care Physician: Bradley [name] Other Clinician:  Betha [name] Referring Physician: Treating Physician/Extender: Bradley [name] in Treatment: [phone] Education Assessment Education Provided To: Patient Education Topics Provided Wound/Skin Impairment: Handouts: Other: continue wound care as directed Methods: Explain/Verbal Responses: State content correctly Electronic Signature(s) Signed: 12/19/2022 4:35:54 PM By: Bradley [name] Entered By: Bradley [name] on 12/14/2022 17:32:34 Bradley [name] ([id number]) [id number] Page 7 of 9 -------------------------------------------------------------------------------- Wound Assessment Details Patient Name: Date of Service: Bradley [name] [name] 12/14/2022 2:45 PM Medical Record Number: [id number] Patient Account Number: [id number] Date of Birth/Sex: Treating RN: [birth year] (77 y.o. Bradley [name], Bradley [name] Primary Care Kaziyah Parkison: Bradley [name] Other Clinician: Betha [name] Referring Shamar Engelmann: Treating Malley Hauter/Extender: Bradley [name] Weeks in Treatment: 178 Wound Status Wound Number: 10 Primary Pressure Ulcer Etiology: Wound Location: Midline Sacrum Wound Open Wounding Event: Pressure Injury Status: Date Acquired: 06/07/2015 Comorbid Anemia, Hypertension, History of pressure wounds, Weeks Of Treatment: 178 History: Rheumatoid Arthritis, Paraplegia Clustered Wound: No Photos Wound Measurements Length: (cm) 8 Width: (cm) 3.5 Depth: (cm) 2.4 Area: (cm) 21.991 Volume: (cm) 52.779 % Reduction in Area: 57.3% % Reduction in Volume: 82.9% Epithelialization: None Wound  Description Classification: Category/Stage IV Wound Margin: Epibole Exudate Amount: Medium Exudate Type: Serosanguineous Exudate Color: red, brown Foul Odor After Cleansing: No Slough/Fibrino Yes Wound Bed Granulation Amount: Large (67-100%) Exposed Structure Granulation Quality: Red, [name] Fascia Exposed: No Necrotic Amount: Small (1-33%) Fat  Layer (Subcutaneous Tissue) Exposed: Yes Necrotic Quality: Adherent Slough Tendon Exposed: No Muscle Exposed: Yes Necrosis of Muscle: No Joint Exposed: No Bone Exposed: No Electronic Signature(s) Signed: 12/19/2022 4:35:54 PM By: Bradley [name] Signed: 12/27/2022 8:06:11 AM By: Bradley [name], BSN, RN, CWS, Kim RN, BSN Entered By: Bradley [name] on 12/14/2022 15:29:08 Bradley [name], Bradley [name] ([id number]) [id number] Page 8 of 9 -------------------------------------------------------------------------------- Wound Assessment Details Patient Name: Date of Service: Bradley [name] [name] 12/14/2022 2:45 PM Medical Record Number: [id number] Patient Account Number: [id number] Date of Birth/Sex: Treating RN: 05-Jan-1946 (77 y.o. Bradley [name], Bradley [name] Primary Care Alvester Eads: Bradley [name] Other Clinician: Betha [name] Referring Wilberth Damon: Treating Fiona Coto/Extender: Bradley [name] Weeks in Treatment: 178 Wound Status Wound Number: 14 Primary Pressure Ulcer Etiology: Wound Location: Left, Posterior Upper Leg Wound Open Wounding Event: Shear/Friction Status: Date Acquired: 11/05/2022 Comorbid Anemia, Hypertension, History of pressure wounds, Weeks Of Treatment: 4 History: Rheumatoid Arthritis, Paraplegia Clustered Wound: Yes Photos Wound Measurements Length: (cm) 5 Width: (cm) 3 Depth: (cm) 0.2 Clustered Quantity: 2 Area: (cm) 11.781 Volume: (cm) 2.356 % Reduction in Area: -455.4% % Reduction in Volume: -11119% Wound Description Classification: Category/Stage II Wound Margin: Thickened Exudate Amount: Medium Exudate Type: Serous Exudate Color: amber Foul Odor After Cleansing: No Slough/Fibrino No Wound Bed Granulation Amount: Large (67-100%) Exposed Structure Granulation Quality: Red, [name] Fascia Exposed: No Necrotic Amount: None Present (0%) Fat Layer (Subcutaneous Tissue) Exposed: Yes Tendon Exposed: No Muscle Exposed: No Joint Exposed: No Bone  Exposed: No Electronic Signature(s) Signed: 12/19/2022 4:35:54 PM By: Bradley [name] Signed: 12/27/2022 8:06:11 AM By: Bradley [name], BSN, RN, CWS, Kim RN, BSN Entered By: Bradley [name] on 12/14/2022 15:31:43 Bradley [name] ([id number]) [id number] Page 9 of 9 -------------------------------------------------------------------------------- Vitals Details Patient Name: Date of Service: Bradley [name] [name] 12/14/2022 2:45 PM Medical Record Number: [id number] Patient Account Number: [id number] Date of Birth/Sex: Treating RN: [birth year] (77 y.o. Bradley [name] Primary Care Lc Joynt: Bradley [name] Other Clinician: Betha [name] Referring Catrell Morrone: Treating Ege Muckey/Extender: Bradley [name] in Treatment: 178 Vital Signs Time Taken: 15:10 Temperature (F): 98.5 Pulse (bpm): 84 Respiratory Rate (breaths/min): 16 Blood Pressure (mmHg): 114/66 Reference Range: 80 - 120 mg / dl Electronic Signature(s) Signed: 12/19/2022 4:35:54 PM By: Bradley [name] Entered By: Bradley [name] on 12/14/2022 15:13:03

## 2022-12-27 NOTE — Progress Notes (Signed)
TATE, ZAGAL ([id number]) [id number] Page 1 of 9 Visit Report for 12/07/2022 Arrival Information Details Patient Name: Date of Service: Bradley [name] [name] 12/07/2022 2:45 PM Medical Record Number: [id number] Patient Account Number: [id number] Date of Birth/Sex: Treating RN: 10/07/1945 (77 y.o. Bradley [name] Primary Care Momodou Consiglio: Marisue Ivan Other Clinician: Betha Loa Referring Ileene Allie: Treating Bradley [name]/Extender: Greta Doom in Treatment: 177 Visit Information History Since Last Visit All ordered tests and consults were completed: No Patient Arrived: Wheel Chair Added or deleted any medications: No Arrival Time: 15:06 Any new allergies or adverse reactions: No Transfer Assistance: Michiel Sites Lift Had [name] fall or experienced change in No Patient Identification Verified: Yes activities of daily living that may affect Secondary Verification Process Completed: Yes risk of falls: Patient Requires Transmission-Based Precautions: No Signs or symptoms of abuse/neglect since last visito No Patient Has Alerts: No Hospitalized since last visit: No Implantable device outside of the clinic excluding No cellular tissue based products placed in the center since last visit: Has Dressing in Place as Prescribed: Yes Pain Present Now: No Electronic Signature(s) Signed: 12/07/2022 4:57:17 PM By: Betha Loa Entered By: Betha Loa on 12/07/2022 15:06:28 -------------------------------------------------------------------------------- Clinic Level of Care Assessment Details Patient Name: Date of Service: Bradley [name] [name] 12/07/2022 2:45 PM Medical Record Number: [id number] Patient Account Number: [id number] Date of Birth/Sex: Treating RN: 19-Nov-1945 (77 y.o. Bradley [name] Primary Care Adon Gehlhausen: Marisue Ivan Other Clinician: Betha Loa Referring Chizaram Latino: Treating [name]/Extender: Greta Doom in Treatment: 177 Clinic Level of Care Assessment Items TOOL 4 Quantity Score []  - 0 Use when only an EandM is performed on FOLLOW-UP visit ASSESSMENTS - Nursing Assessment / Reassessment X- 1 10 Reassessment of Co-morbidities (includes updates in patient status) X- 1 5 Reassessment of Adherence to Treatment Plan Bradley [name], Bradley [name] ([id number]) [id number] Page 2 of 9 ASSESSMENTS - Wound and Skin [name] ssessment / Reassessment []  - 0 Simple Wound Assessment / Reassessment - one wound X- 2 5 Complex Wound Assessment / Reassessment - multiple wounds []  - 0 Dermatologic / Skin Assessment (not related to wound area) ASSESSMENTS - Focused Assessment []  - 0 Circumferential Edema Measurements - multi extremities []  - 0 Nutritional Assessment / Counseling / Intervention []  - 0 Lower Extremity Assessment (monofilament, tuning fork, pulses) []  - 0 Peripheral Arterial Disease Assessment (using hand held doppler) ASSESSMENTS - Ostomy and/or Continence Assessment and Care []  - 0 Incontinence Assessment and Management []  - 0 Ostomy Care Assessment and Management (repouching, etc.) PROCESS - Coordination of Care X - Simple Patient / Family Education for ongoing care 1 15 []  - 0 Complex (extensive) Patient / Family Education for ongoing care []  - 0 Staff obtains Chiropractor, Records, T Results / Process Orders est []  - 0 Staff telephones HHA, Nursing Homes / Clarify orders / etc []  - 0 Routine Transfer to another Facility (non-emergent condition) []  - 0 Routine Hospital Admission (non-emergent condition) []  - 0 New Admissions / Manufacturing engineer / Ordering NPWT Apligraf, etc. , []  - 0 Emergency Hospital Admission (emergent condition) X- 1 10 Simple Discharge Coordination []  - 0 Complex (extensive) Discharge Coordination PROCESS - Special Needs []  - 0 Pediatric / Minor Patient Management []  - 0 Isolation Patient Management []  - 0 Hearing  / Language / Visual special needs []  - 0 Assessment of Community assistance (transportation, D/C planning, etc.) []  - 0 Additional assistance / Altered mentation []  - 0 Support Surface(s) Assessment (bed, cushion, seat, etc.) INTERVENTIONS -  Wound Cleansing / Measurement []  - 0 Simple Wound Cleansing - one wound X- 2 5 Complex Wound Cleansing - multiple wounds X- 1 5 Wound Imaging (photographs - any number of wounds) []  - 0 Wound Tracing (instead of photographs) []  - 0 Simple Wound Measurement - one wound X- 2 5 Complex Wound Measurement - multiple wounds INTERVENTIONS - Wound Dressings []  - 0 Small Wound Dressing one or multiple wounds X- 2 15 Medium Wound Dressing one or multiple wounds []  - 0 Large Wound Dressing one or multiple wounds []  - 0 Application of Medications - topical []  - 0 Application of Medications - injection INTERVENTIONS - Miscellaneous []  - 0 External ear exam Bradley [name], Bradley [name] ([id number]) [id number] Page 3 of 9 []  - 0 Specimen Collection (cultures, biopsies, blood, body fluids, etc.) []  - 0 Specimen(s) / Culture(s) sent or taken to Lab for analysis X- 1 10 Patient Transfer (multiple staff / Michiel Sites Lift / Similar devices) []  - 0 Simple Staple / Suture removal (25 or less) []  - 0 Complex Staple / Suture removal (26 or more) []  - 0 Hypo / Hyperglycemic Management (close monitor of Blood Glucose) []  - 0 Ankle / Brachial Index (ABI) - do not check if billed separately X- 1 5 Vital Signs Has the patient been seen at the hospital within the last three years: Yes Total Score: 120 Level Of Care: New/Established - Level 4 Electronic Signature(s) Signed: 12/07/2022 4:57:17 PM By: Betha Loa Entered By: Betha Loa on 12/07/2022 16:01:34 -------------------------------------------------------------------------------- Encounter Discharge Information Details Patient Name: Date of Service: Bradley [name] [name] 12/07/2022 2:45  PM Medical Record Number: [id number] Patient Account Number: [id number] Date of Birth/Sex: Treating RN: 1946/02/19 (77 y.o. Bradley [name], Bradley [name] Primary Care Cadee Agro: Marisue Ivan Other Clinician: Betha Loa Referring Vinal Rosengrant: Treating Bradley [name]/Extender: Greta Doom in Treatment: [phone] Encounter Discharge Information Items Discharge Condition: Stable Ambulatory Status: Wheelchair Discharge Destination: Home Transportation: Other Accompanied By: self Schedule Follow-up Appointment: Yes Clinical Summary of Care: Electronic Signature(s) Signed: 12/07/2022 4:57:17 PM By: Betha Loa Entered By: Betha Loa on 12/07/2022 16:50:53 Lower Extremity Assessment Details -------------------------------------------------------------------------------- Bradley [name] ([id number]) [id number] Page 4 of 9 Patient Name: Date of Service: Bradley [name] [name] 12/07/2022 2:45 PM Medical Record Number: [id number] Patient Account Number: [id number] Date of Birth/Sex: Treating RN: [birth year] (77 y.o. Bradley [name] Primary Care Veanna Dower: Marisue Ivan Other Clinician: Betha Loa Referring Ichelle Harral: Treating Lavette Yankovich/Extender: Greta Doom in Treatment: 177 Electronic Signature(s) Signed: 12/07/2022 4:57:17 PM By: Betha Loa Signed: 12/27/2022 8:06:11 AM By: Elliot Gurney, BSN, RN, CWS, Kim RN, BSN Entered By: Betha Loa on 12/07/2022 15:28:16 -------------------------------------------------------------------------------- Multi Wound Chart Details Patient Name: Date of Service: Bradley [name] [name] 12/07/2022 2:45 PM Medical Record Number: [id number] Patient Account Number: [id number] Date of Birth/Sex: Treating RN: 10-21-1945 (77 y.o. Bradley [name], Kim Primary Care Dashae Wilcher: Marisue Ivan Other Clinician: Betha Loa Referring Rayn Shorb: Treating Bradley [name]/Extender: Greta Doom in Treatment: 177 Vital Signs Height(in): Pulse(bpm): 74 Weight(lbs): Blood Pressure(mmHg): 97/78 Body Mass Index(BMI): Temperature(F): 98.0 Respiratory Rate(breaths/min): 16 [10:Photos:] [[name]:[name]] Midline Sacrum Left, Posterior Upper Leg [name] Wound Location: Pressure Injury Shear/Friction [name] Wounding Event: Pressure Ulcer Pressure Ulcer [name] Primary Etiology: Anemia, Hypertension, History of Anemia, Hypertension, History of [name] Comorbid History: pressure wounds, Rheumatoid Arthritis, pressure wounds, Rheumatoid Arthritis, Paraplegia Paraplegia 06/07/2015 11/05/2022 [name] Date Acquired: 177 3 [name] Weeks of Treatment: Open Open [name] Wound Status: No No [name] Wound Recurrence: No Yes [name] Clustered  Wound: [name] 2 [name] Clustered Quantity: 8x3.5x2 3.4x4x0.4 [name] Measurements L x W x D (cm) 21.991 10.681 [name] [name] (cm) : rea 43.982 4.273 [name] Volume (cm) : 57.30% -403.60% [name] % Reduction in [name] rea: 85.80% -20247.60% [name] % Reduction in Volume: Category/Stage IV Category/Stage II [name] Classification: Medium Medium [name] Exudate [name] mount: Serosanguineous Serous [name] Exudate Type: red, brown amber [name] Exudate Color: Epibole Thickened [name] Wound Margin: Large (67-100%) Large (67-100%) [name] Granulation [name] mount: Red, [name] Red, [name] [name] Granulation QualityKEONTA, Bradley [name] ([id number]) [id number] Page 5 of 9 Small (1-33%) None Present (0%) [name] Necrotic Amount: Fat Layer (Subcutaneous Tissue): Yes Fat Layer (Subcutaneous Tissue): Yes [name] Exposed Structures: Muscle: Yes Fascia: No Fascia: No Tendon: No Tendon: No Muscle: No Joint: No Joint: No Bone: No Bone: No None [name] [name] Epithelialization: Treatment Notes Electronic Signature(s) Signed: 12/07/2022 4:57:17 PM By: Betha Loa Entered By: Betha Loa on 12/07/2022 15:28:22 -------------------------------------------------------------------------------- Multi-Disciplinary Care Plan  Details Patient Name: Date of Service: Bradley [name] [name] 12/07/2022 2:45 PM Medical Record Number: [id number] Patient Account Number: [id number] Date of Birth/Sex: Treating RN: 1945-11-24 (77 y.o. Bradley [name] Primary Care Lukasz Rogus: Marisue Ivan Other Clinician: Betha Loa Referring Magdaleno Lortie: Treating Bradley [name]/Extender: Greta Doom in Treatment: 177 Active Inactive Electronic Signature(s) Signed: 12/07/2022 4:57:17 PM By: Betha Loa Signed: 12/27/2022 8:06:11 AM By: Elliot Gurney, BSN, RN, CWS, Kim RN, BSN Entered By: Betha Loa on 12/07/2022 16:02:29 -------------------------------------------------------------------------------- Pain Assessment Details Patient Name: Date of Service: Bradley [name] [name] 12/07/2022 2:45 PM Medical Record Number: [id number] Patient Account Number: [id number] Date of Birth/Sex: Treating RN: June 02, 1946 (77 y.o. Bradley [name] Primary Care Kayceon Oki: Marisue Ivan Other Clinician: Betha Loa Referring Joran Kallal: Treating Bradley [name]/Extender: Greta Doom in Treatment: [phone] Active Problems Location of Pain Severity and Description of Pain Patient Has Paino No Site Locations Bradley [name], Bradley [name] ([id number]) [id number] Page 6 of 9 Pain Management and Medication Current Pain Management: Electronic Signature(s) Signed: 12/07/2022 4:57:17 PM By: Betha Loa Signed: 12/27/2022 8:06:11 AM By: Elliot Gurney, BSN, RN, CWS, Kim RN, BSN Entered By: Betha Loa on 12/07/2022 15:08:42 -------------------------------------------------------------------------------- Patient/Caregiver Education Details Patient Name: Date of Service: Bradley [name] [name] 7/3/2024andnbsp2:45 PM Medical Record Number: [id number] Patient Account Number: [id number] Date of Birth/Gender: Treating RN: 1945/08/17 (77 y.o. Bradley [name] Primary Care Physician: Marisue Ivan Other Clinician: Betha Loa Referring Physician: Treating Physician/Extender: Greta Doom in Treatment: 177 Education Assessment Education Provided To: Patient Education Topics Provided Wound/Skin Impairment: Handouts: Other: continue wound care as directed Methods: Explain/Verbal Responses: State content correctly Electronic Signature(s) Signed: 12/07/2022 4:57:17 PM By: Betha Loa Entered By: Betha Loa on 12/07/2022 16:50:04 Bradley [name] ([id number]) [id number] Page 7 of 9 -------------------------------------------------------------------------------- Wound Assessment Details Patient Name: Date of Service: Bradley [name] [name] 12/07/2022 2:45 PM Medical Record Number: [id number] Patient Account Number: [id number] Date of Birth/Sex: Treating RN: 05-08-1946 (77 y.o. Bradley [name], Bradley [name] Primary Care Jeanetta Alonzo: Marisue Ivan Other Clinician: Betha Loa Referring Erryn Dickison: Treating Bradley Verno/Extender: Bradley [name] Weeks in Treatment: 177 Wound Status Wound Number: 10 Primary Pressure Ulcer Etiology: Wound Location: Midline Sacrum Wound Open Wounding Event: Pressure Injury Status: Date Acquired: 06/07/2015 Comorbid Anemia, Hypertension, History of pressure wounds, Weeks Of Treatment: 177 History: Rheumatoid Arthritis, Paraplegia Clustered Wound: No Photos Wound Measurements Length: (cm) 8 Width: (cm) 3.5 Depth: (cm) 2 Area: (cm) 21.991 Volume: (cm) 43.982 % Reduction in Area: 57.3% % Reduction in Volume: 85.8% Epithelialization: None Wound  Description Classification: Category/Stage IV Wound Margin: Epibole Exudate Amount: Medium Exudate Type: Serosanguineous Exudate Color: red, brown Foul Odor After Cleansing: No Slough/Fibrino Yes Wound Bed Granulation Amount: Large (67-100%) Exposed Structure Granulation Quality: Red, [name] Fascia Exposed: No Necrotic Amount: Small (1-33%) Fat Layer  (Subcutaneous Tissue) Exposed: Yes Necrotic Quality: Adherent Slough Tendon Exposed: No Muscle Exposed: Yes Necrosis of Muscle: No Joint Exposed: No Bone Exposed: No Electronic Signature(s) Signed: 12/07/2022 4:57:17 PM By: Betha Loa Signed: 12/27/2022 8:06:11 AM By: Elliot Gurney, BSN, RN, CWS, Kim RN, BSN Entered By: Betha Loa on 12/07/2022 15:26:38 Bradley [name], Bradley [name] ([id number]) [id number] Page 8 of 9 -------------------------------------------------------------------------------- Wound Assessment Details Patient Name: Date of Service: Bradley [name] [name] 12/07/2022 2:45 PM Medical Record Number: [id number] Patient Account Number: [id number] Date of Birth/Sex: Treating RN: December 11, 1945 (77 y.o. Bradley [name], Bradley [name] Primary Care Aarion Metzgar: Marisue Ivan Other Clinician: Betha Loa Referring Geneveive Furness: Treating Yue Glasheen/Extender: Bradley [name] Weeks in Treatment: 177 Wound Status Wound Number: 14 Primary Pressure Ulcer Etiology: Wound Location: Left, Posterior Upper Leg Wound Open Wounding Event: Shear/Friction Status: Date Acquired: 11/05/2022 Comorbid Anemia, Hypertension, History of pressure wounds, Weeks Of Treatment: 3 History: Rheumatoid Arthritis, Paraplegia Clustered Wound: Yes Photos Wound Measurements Length: (cm) 3.4 Width: (cm) 4 Depth: (cm) 0.4 Clustered Quantity: 2 Area: (cm) 10.681 Volume: (cm) 4.273 % Reduction in Area: -403.6% % Reduction in Volume: -20247.6% Wound Description Classification: Category/Stage II Wound Margin: Thickened Exudate Amount: Medium Exudate Type: Serous Exudate Color: amber Foul Odor After Cleansing: No Slough/Fibrino No Wound Bed Granulation Amount: Large (67-100%) Exposed Structure Granulation Quality: Red, [name] Fascia Exposed: No Necrotic Amount: None Present (0%) Fat Layer (Subcutaneous Tissue) Exposed: Yes Tendon Exposed: No Muscle Exposed: No Joint Exposed: No Bone  Exposed: No Electronic Signature(s) Signed: 12/07/2022 4:57:17 PM By: Betha Loa Signed: 12/27/2022 8:06:11 AM By: Elliot Gurney, BSN, RN, CWS, Kim RN, BSN Entered By: Betha Loa on 12/07/2022 15:27:08 RANDOLF, SANSOUCIE ([id number]) [id number] Page 9 of 9 -------------------------------------------------------------------------------- Vitals Details Patient Name: Date of Service: Bradley [name] [name] 12/07/2022 2:45 PM Medical Record Number: [id number] Patient Account Number: [id number] Date of Birth/Sex: Treating RN: December 31, 1945 (77 y.o. Bradley [name] Primary Care Ahriana Gunkel: Marisue Ivan Other Clinician: Betha Loa Referring Ruari Mudgett: Treating Kashlynn Kundert/Extender: Greta Doom in Treatment: 177 Vital Signs Time Taken: 15:06 Temperature (F): 98.0 Pulse (bpm): 74 Respiratory Rate (breaths/min): 16 Blood Pressure (mmHg): 97/78 Reference Range: 80 - 120 mg / dl Electronic Signature(s) Signed: 12/07/2022 4:57:17 PM By: Betha Loa Entered By: Betha Loa on 12/07/2022 15:08:38

## 2022-12-28 ENCOUNTER — Encounter (HOSPITAL_BASED_OUTPATIENT_CLINIC_OR_DEPARTMENT_OTHER): Payer: Medicare HMO | Admitting: Internal Medicine

## 2022-12-28 DIAGNOSIS — M069 Rheumatoid arthritis, unspecified: Secondary | ICD-10-CM | POA: Diagnosis not present

## 2022-12-28 DIAGNOSIS — L97129 Non-pressure chronic ulcer of left thigh with unspecified severity: Secondary | ICD-10-CM | POA: Diagnosis not present

## 2022-12-28 DIAGNOSIS — M8668 Other chronic osteomyelitis, other site: Secondary | ICD-10-CM | POA: Diagnosis not present

## 2022-12-28 DIAGNOSIS — D509 Iron deficiency anemia, unspecified: Secondary | ICD-10-CM | POA: Diagnosis not present

## 2022-12-28 DIAGNOSIS — G8220 Paraplegia, unspecified: Secondary | ICD-10-CM | POA: Diagnosis not present

## 2022-12-28 DIAGNOSIS — L89153 Pressure ulcer of sacral region, stage 3: Secondary | ICD-10-CM | POA: Diagnosis not present

## 2022-12-28 DIAGNOSIS — L89154 Pressure ulcer of sacral region, stage 4: Secondary | ICD-10-CM | POA: Diagnosis not present

## 2022-12-28 DIAGNOSIS — L97111 Non-pressure chronic ulcer of right thigh limited to breakdown of skin: Secondary | ICD-10-CM | POA: Diagnosis not present

## 2022-12-28 DIAGNOSIS — L89892 Pressure ulcer of other site, stage 2: Secondary | ICD-10-CM | POA: Diagnosis not present

## 2022-12-28 DIAGNOSIS — L89893 Pressure ulcer of other site, stage 3: Secondary | ICD-10-CM

## 2022-12-28 DIAGNOSIS — S81802A Unspecified open wound, left lower leg, initial encounter: Secondary | ICD-10-CM

## 2022-12-28 DIAGNOSIS — I10 Essential (primary) hypertension: Secondary | ICD-10-CM | POA: Diagnosis not present

## 2022-12-28 NOTE — Progress Notes (Signed)
NORM, WRAY ([id number]) [id number] Page 1 of 10 Visit Report for 12/28/2022 Arrival Information Details Patient Name: Date of Service: Bradley [name] MES A. 12/28/2022 2:45 PM Medical Record Number: [id number] Patient Account Number: [id number] Date of Birth/Sex: Treating RN: 1946-04-13 (77 y.o. Bradley [name] Primary Care Council [place]: Marisue Ivan Other Clinician: Referring Lanett Lasorsa: Treating Akram Kissick/Extender: Greta Doom in Treatment: 180 Visit Information History Since Last Visit All ordered tests and consults were completed: No Patient Arrived: Wheel Chair Added or deleted any medications: No Arrival Time: 14:57 Any new allergies or adverse reactions: No Accompanied By: self Had a fall or experienced change in No Transfer Assistance: Nurse, adult activities of daily living that may affect Patient Identification Verified: Yes risk of falls: Secondary Verification Process Completed: Yes Hospitalized since last visit: No Patient Requires Transmission-Based Precautions: No Pain Present Now: No Patient Has Alerts: No Electronic Signature(s) Signed: 12/28/2022 4:46:32 PM By: Bonnell Public Entered By: Bonnell Public on 12/28/2022 14:59:17 -------------------------------------------------------------------------------- Clinic Level of Care Assessment Details Patient Name: Date of Service: Bradley [name] MES A. 12/28/2022 2:45 PM Medical Record Number: [id number] Patient Account Number: [id number] Date of Birth/Sex: Treating RN: April 09, 1946 (77 y.o. Bradley [name] Primary Care Maclain Cohron: Marisue Ivan Other Clinician: Referring Ramonte Mena: Treating Verenis Nicosia/Extender: Greta Doom in Treatment: 180 Clinic Level of Care Assessment Items TOOL 4 Quantity Score []  - 0 Use when only an EandM is performed on FOLLOW-UP visit ASSESSMENTS - Nursing Assessment / Reassessment X- 1 10 Reassessment of  Co-morbidities (includes updates in patient status) X- 1 5 Reassessment of Adherence to Treatment Plan ASSESSMENTS - Wound and Skin A ssessment / Reassessment []  - 0 Simple Wound Assessment / Reassessment - one wound X- 2 5 Complex Wound Assessment / Reassessment - multiple wounds JOSECARLOS, HARRIOTT ([id number]) [id number] Page 2 of 10 []  - 0 Dermatologic / Skin Assessment (not related to wound area) ASSESSMENTS - Focused Assessment []  - 0 Circumferential Edema Measurements - multi extremities []  - 0 Nutritional Assessment / Counseling / Intervention []  - 0 Lower Extremity Assessment (monofilament, tuning fork, pulses) []  - 0 Peripheral Arterial Disease Assessment (using hand held doppler) ASSESSMENTS - Ostomy and/or Continence Assessment and Care []  - 0 Incontinence Assessment and Management []  - 0 Ostomy Care Assessment and Management (repouching, etc.) PROCESS - Coordination of Care X - Simple Patient / Family Education for ongoing care 1 15 []  - 0 Complex (extensive) Patient / Family Education for ongoing care X- 1 10 Staff obtains Chiropractor, Records, T Results / Process Orders est []  - 0 Staff telephones HHA, Nursing Homes / Clarify orders / etc []  - 0 Routine Transfer to another Facility (non-emergent condition) []  - 0 Routine Hospital Admission (non-emergent condition) []  - 0 New Admissions / Manufacturing engineer / Ordering NPWT Apligraf, etc. , []  - 0 Emergency Hospital Admission (emergent condition) X- 1 10 Simple Discharge Coordination []  - 0 Complex (extensive) Discharge Coordination PROCESS - Special Needs []  - 0 Pediatric / Minor Patient Management []  - 0 Isolation Patient Management []  - 0 Hearing / Language / Visual special needs []  - 0 Assessment of Community assistance (transportation, D/C planning, etc.) []  - 0 Additional assistance / Altered mentation []  - 0 Support Surface(s) Assessment (bed, cushion, seat,  etc.) INTERVENTIONS - Wound Cleansing / Measurement []  - 0 Simple Wound Cleansing - one wound X- 2 5 Complex Wound Cleansing - multiple wounds X- 1 5 Wound Imaging (photographs - any number of wounds) []  -  0 Wound Tracing (instead of photographs) []  - 0 Simple Wound Measurement - one wound X- 2 5 Complex Wound Measurement - multiple wounds INTERVENTIONS - Wound Dressings X - Small Wound Dressing one or multiple wounds 2 10 []  - 0 Medium Wound Dressing one or multiple wounds []  - 0 Large Wound Dressing one or multiple wounds []  - 0 Application of Medications - topical []  - 0 Application of Medications - injection INTERVENTIONS - Miscellaneous []  - 0 External ear exam []  - 0 Specimen Collection (cultures, biopsies, blood, body fluids, etc.) []  - 0 Specimen(s) / Culture(s) sent or taken to Lab for analysis GRAHM, ETSITTY ([id number]) [id number] Page 3 of 10 []  - 0 Patient Transfer (multiple staff / Nurse, adult / Similar devices) []  - 0 Simple Staple / Suture removal (25 or less) []  - 0 Complex Staple / Suture removal (26 or more) []  - 0 Hypo / Hyperglycemic Management (close monitor of Blood Glucose) []  - 0 Ankle / Brachial Index (ABI) - do not check if billed separately X- 1 5 Vital Signs Has the patient been seen at the hospital within the last three years: Yes Total Score: 110 Level Of Care: New/Established - Level 3 Electronic Signature(s) Signed: 12/28/2022 4:46:32 PM By: Bonnell Public Entered By: Bonnell Public on 12/28/2022 15:31:01 -------------------------------------------------------------------------------- Encounter Discharge Information Details Patient Name: Date of Service: Bradley [name] MES A. 12/28/2022 2:45 PM Medical Record Number: [id number] Patient Account Number: [id number] Date of Birth/Sex: Treating RN: 1945/11/23 (77 y.o. Bradley [name] Primary Care Loreena Valeri: Marisue Ivan Other Clinician: Referring  Correne Lalani: Treating Aris Moman/Extender: Greta Doom in Treatment: 180 Encounter Discharge Information Items Discharge Condition: Stable Ambulatory Status: Wheelchair Discharge Destination: Home Schedule Follow-up Appointment: No Clinical Summary of Care: Electronic Signature(s) Signed: 12/28/2022 4:46:32 PM By: Bonnell Public Entered By: Bonnell Public on 12/28/2022 15:55:02 -------------------------------------------------------------------------------- Lower Extremity Assessment Details Patient Name: Date of Service: Bradley [name] MES A. 12/28/2022 2:45 PM Medical Record Number: [id number] Patient Account Number: [id number] Date of Birth/Sex: Treating RN: 1946-04-09 (77 y.o. Bradley [name] Primary Care Josel Keo: Marisue Ivan Other Clinician: Referring Emet Rafanan: Treating Yisel Megill/Extender: Greta Doom in Treatment: [address], [place] A ([id number]) [id number] Page 4 of 10 Electronic Signature(s) Signed: 12/28/2022 4:46:32 PM By: Bonnell Public Entered By: Bonnell Public on 12/28/2022 15:20:23 -------------------------------------------------------------------------------- Multi Wound Chart Details Patient Name: Date of Service: Bradley [name] MES A. 12/28/2022 2:45 PM Medical Record Number: [id number] Patient Account Number: [id number] Date of Birth/Sex: Treating RN: 16-Jan-1946 (77 y.o. Bradley [name] Primary Care Lorrie Gargan: Marisue Ivan Other Clinician: Referring Guenevere Roorda: Treating Felicity Penix/Extender: Greta Doom in Treatment: 180 Vital Signs Height(in): Pulse(bpm): 91 Weight(lbs): Blood Pressure(mmHg): 100/67 Body Mass Index(BMI): Temperature(F): 98.4 Respiratory Rate(breaths/min): 18 [10:Photos:] [N/A:N/A] Midline Sacrum Left, Posterior Upper Leg N/A Wound Location: Pressure Injury Shear/Friction N/A Wounding Event: Pressure Ulcer Pressure Ulcer  N/A Primary Etiology: Anemia, Hypertension, History of Anemia, Hypertension, History of N/A Comorbid History: pressure wounds, Rheumatoid Arthritis, pressure wounds, Rheumatoid Arthritis, Paraplegia Paraplegia 06/07/2015 11/05/2022 N/A Date Acquired: 180 6 N/A Weeks of Treatment: Open Open N/A Wound Status: No No N/A Wound Recurrence: No Yes N/A Clustered Wound: N/A 2 N/A Clustered Quantity: 7x4x3.5 6x3x0.3 N/A Measurements L x W x D (cm) 21.991 14.137 N/A A (cm) : rea 76.969 4.241 N/A Volume (cm) : 57.30% -566.50% N/A % Reduction in A rea: 75.10% -20095.20% N/A % Reduction in Volume: Category/Stage IV Category/Stage III N/A Classification: Medium Medium N/A Exudate A mount: Serosanguineous  Serous N/A Exudate Type: red, brown amber N/A Exudate Color: Epibole Thickened N/A Wound Margin: Large (67-100%) Medium (34-66%) N/A Granulation A mount: Red, [name] [name] N/A Granulation Quality: Small (1-33%) Small (1-33%) N/A Necrotic A mount: Fat Layer (Subcutaneous Tissue): Yes Fat Layer (Subcutaneous Tissue): Yes N/A Exposed Structures: Muscle: Yes Fascia: No Fascia: No Tendon: No Tendon: No Muscle: No Joint: No Joint: No Bone: No Bone: No None N/A N/A Epithelialization: MARL, SEAGO ([id number]) [id number] Page 5 of 10 Treatment Notes Electronic Signature(s) Signed: 12/28/2022 4:46:32 PM By: Bonnell Public Entered By: Bonnell Public on 12/28/2022 15:22:16 -------------------------------------------------------------------------------- Multi-Disciplinary Care Plan Details Patient Name: Date of Service: Bradley [name] MES A. 12/28/2022 2:45 PM Medical Record Number: [id number] Patient Account Number: [id number] Date of Birth/Sex: Treating RN: 07-17-1945 (77 y.o. Bradley [name] Primary Care Keliyah Spillman: Marisue Ivan Other Clinician: Referring Bralon Antkowiak: Treating Dantre Yearwood/Extender: Claudie Fisherman Weeks in Treatment:  180 Active Inactive Electronic Signature(s) Signed: 12/28/2022 4:46:32 PM By: Bonnell Public Entered By: Bonnell Public on 12/28/2022 15:31:42 -------------------------------------------------------------------------------- Pain Assessment Details Patient Name: Date of Service: Bradley [name] MES A. 12/28/2022 2:45 PM Medical Record Number: [id number] Patient Account Number: [id number] Date of Birth/Sex: Treating RN: Sep 27, 1945 (77 y.o. Bradley [name] Primary Care Iyanni Hepp: Marisue Ivan Other Clinician: Referring Kendra Woolford: Treating Amberlee Garvey/Extender: Greta Doom in Treatment: 180 Active Problems Location of Pain Severity and Description of Pain Patient Has Paino No Site Locations GLYNDON, TURSI A ([id number]) [id number] Page 6 of 10 Pain Management and Medication Current Pain Management: Electronic Signature(s) Signed: 12/28/2022 4:46:32 PM By: Bonnell Public Entered By: Bonnell Public on 12/28/2022 15:08:02 -------------------------------------------------------------------------------- Patient/Caregiver Education Details Patient Name: Date of Service: Bradley [name] MES A. 7/24/2024andnbsp2:45 PM Medical Record Number: [id number] Patient Account Number: [id number] Date of Birth/Gender: Treating RN: [birth year] (77 y.o. Bradley [name] Primary Care Physician: Marisue Ivan Other Clinician: Referring Physician: Treating Physician/Extender: Greta Doom in Treatment: 180 Education Assessment Education Provided To: Patient Education Topics Provided Offloading: Handouts: Other: minimize sit time; offload in bed Methods: Explain/Verbal Responses: State content correctly Wound/Skin Impairment: Handouts: Caring for Your Ulcer Methods: Explain/Verbal Responses: State content correctly Electronic Signature(s) Signed: 12/28/2022 4:46:32 PM By: Bonnell Public Entered By: Bonnell Public on  12/28/2022 15:54:18 [name] ([id number]) [id number] Page 7 of 10 -------------------------------------------------------------------------------- Wound Assessment Details Patient Name: Date of Service: Bradley [name] MES A. 12/28/2022 2:45 PM Medical Record Number: [id number] Patient Account Number: [id number] Date of Birth/Sex: Treating RN: 10-Dec-1945 (77 y.o. Bradley [name] Primary Care Anel Purohit: Marisue Ivan Other Clinician: Referring Derrick Tiegs: Treating Ivyonna Hoelzel/Extender: Claudie Fisherman Weeks in Treatment: 180 Wound Status Wound Number: 10 Primary Pressure Ulcer Etiology: Wound Location: Midline Sacrum Wound Open Wounding Event: Pressure Injury Status: Date Acquired: 06/07/2015 Comorbid Anemia, Hypertension, History of pressure wounds, Weeks Of Treatment: 180 History: Rheumatoid Arthritis, Paraplegia Clustered Wound: No Photos Wound Measurements Length: (cm) 7 Width: (cm) 4 Depth: (cm) 3 Area: (cm) Volume: (cm) % Reduction in Area: 57.3% % Reduction in Volume: 75.1% .5 Epithelialization: None 21.991 76.969 Wound Description Classification: Category/Stage IV Wound Margin: Epibole Exudate Amount: Medium Exudate Type: Serosanguineous Exudate Color: red, brown Foul Odor After Cleansing: No Slough/Fibrino Yes Wound Bed Granulation Amount: Large (67-100%) Exposed Structure Granulation Quality: Red, [name] Fascia Exposed: No Necrotic Amount: Small (1-33%) Fat Layer (Subcutaneous Tissue) Exposed: Yes Necrotic Quality: Adherent Slough Tendon Exposed: No Muscle Exposed: Yes Necrosis of Muscle: No Joint Exposed: No Bone Exposed: No Treatment Notes Wound #10 (Sacrum) Wound Laterality:  Midline Cleanser Dakin 16 (oz) 0.25 Discharge Instruction: Use as directed. KENYATTE, CHATMON ([id number]) [id number] Page 8 of 10 Peri-Wound Care Topical Primary Dressing Gauze Discharge Instruction:  As directed: moistened with Dakins Solution Secondary Dressing Zetuvit Plus 4x4 (in/in) Secured With Medipore T - 41M Medipore H Soft Cloth Surgical T ape ape, 2x2 (in/yd) Compression Wrap Compression Stockings Add-Ons Electronic Signature(s) Signed: 12/28/2022 4:46:32 PM By: Bonnell Public Entered By: Bonnell Public on 12/28/2022 15:17:43 -------------------------------------------------------------------------------- Wound Assessment Details Patient Name: Date of Service: Bradley [name] MES A. 12/28/2022 2:45 PM Medical Record Number: [id number] Patient Account Number: [id number] Date of Birth/Sex: Treating RN: 01-19-1946 (77 y.o. Bradley [name] Primary Care Terin Cragle: Marisue Ivan Other Clinician: Referring Stepahnie Campo: Treating Issai Werling/Extender: Claudie Fisherman Weeks in Treatment: 180 Wound Status Wound Number: 14 Primary Pressure Ulcer Etiology: Wound Location: Left, Posterior Upper Leg Wound Open Wounding Event: Shear/Friction Status: Date Acquired: 11/05/2022 Comorbid Anemia, Hypertension, History of pressure wounds, Weeks Of Treatment: 6 History: Rheumatoid Arthritis, Paraplegia Clustered Wound: Yes Photos Wound Measurements Length: (cm) 6 Width: (cm) 3 Depth: (cm) 0.3 Clustered Quantity: 2 Area: (cm) 14.137 Volume: (cm) 4.241 NIKESH, TESCHNER A ([id number]) % Reduction in Area: -566.5% % Reduction in Volume: -20095.2% [id number] Page 9 of 10 Wound Description Classification: Category/Stage III Wound Margin: Thickened Exudate Amount: Medium Exudate Type: Serous Exudate Color: amber Foul Odor After Cleansing: No Slough/Fibrino Yes Wound Bed Granulation Amount: Medium (34-66%) Exposed Structure Granulation Quality: [name] Fascia Exposed: No Necrotic Amount: Small (1-33%) Fat Layer (Subcutaneous Tissue) Exposed: Yes Necrotic Quality: Adherent Slough Tendon Exposed: No Muscle Exposed: No Joint Exposed: No Bone  Exposed: No Treatment Notes Wound #14 (Upper Leg) Wound Laterality: Left, Posterior Cleanser Peri-Wound Care Topical Mupirocin Ointment Discharge Instruction: Apply as directed by Laderrick Wilk. Primary Dressing Hydrofera Blue Ready Transfer Foam, 2.5x2.5 (in/in) Discharge Instruction: Apply Hydrofera Blue Ready to wound bed as directed Secondary Dressing Secured With Tegaderm Film Transparent 6x8 (in/in) Discharge Instruction: Apply to wound bed Compression Wrap Compression Stockings Add-Ons Electronic Signature(s) Signed: 12/28/2022 4:46:32 PM By: Bonnell Public Entered By: Bonnell Public on 12/28/2022 15:20:07 -------------------------------------------------------------------------------- Vitals Details Patient Name: Date of Service: Bradley [name] MES A. 12/28/2022 2:45 PM Medical Record Number: [id number] Patient Account Number: [id number] Date of Birth/Sex: Treating RN: 1945/09/05 (77 y.o. Bradley [name] Primary Care Dougles Kimmey: Marisue Ivan Other Clinician: Referring Kaladin Noseworthy: Treating Brendia Dampier/Extender: Claudie Fisherman Weeks in Treatment: 180 Vital Signs Time Taken: 14:59 Temperature (F): 98.4 Pulse (bpm): 91 LUSTER, HECHLER A ([id number]) [id number] Page 10 of 10 Respiratory Rate (breaths/min): 18 Blood Pressure (mmHg): 100/67 Reference Range: 80 - 120 mg / dl Electronic Signature(s) Signed: 12/28/2022 4:46:32 PM By: Bonnell Public Entered By: Bonnell Public on 12/28/2022 15:07:57

## 2022-12-28 NOTE — Progress Notes (Signed)
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 over his coccyx and adjacent right gluteal. He is using Prisma and previously has used Aquacel Ag. There has been small improvements in the measurements although this may be measurement. In talking with him he apparently changes the dressing every day although it appears that only half the days will he have collagen may be the rest of the day following that. He has home health coming in but that description sounded vague as well. He has a rotation on his wheelchair and an air mattress. I would need to discuss pressure relieved with him more next  time to have a sense of this 08/12/15; the patient has been using Hydrofera Blue. Base of the wound appears healthy. Less adherent surface slough. He has an appointment with the plastic surgery at [facility] on March 29. We have been following him every 2 weeks 09/10/15 patient is been to see plastic surgery at [facility]. He is being scheduled for a skin graft to the area. The patient has questions about whether he will Bradley [name], Bradley [name] ([id number]) [id number] Page 9 of 16 be able to manage on his own these to be keeping off the graft site. He tells me he had some sort of fall when he went to [facility]. He apparently traumatized the wound and it is really significantly larger today but without evidence of infection. Roughly 2 cm wider and precariously close now to his perianal area and some aspects. 03/02/16; we have not seen this patient in 5 months. He is been followed by plastic surgery at [facility]. The last note from plastic surgery I see was dated  12/15/15. He underwent some form of tissue graft on 09/24/15. This did not the do very well. According the patient is not felt that he could easily undergo additional plastic surgery secondary to the wounds close proximity to the anus. Apparently the patient was offered a diverting colostomy at one point. In any case he is only been using wet to dry dressings surprisingly changing this himself at home using a mirror. He does not have home health. He does have a level II pressure-relief surface as well as a Roho cushion for his wheelchair. In spite of this the wound is considerably larger one than when he was last in the clinic currently measuring 12.5 x 7. There is also an area superiorly in the wound that tunnels more deeply. Clearly a stage III wound 03/15/16 patient presents today for reevaluation concerning his midline sacral pressure ulcer. This again is an extensive ulcer which does not extend to bone fortunately but is sufficiently  large to make healing of this wound difficult. Again he has been seen at [facility] where apparently they did discuss with him the possibility of a diverting colostomy but he did not want any part of that. Subsequently he has not followed up there currently. He continues overall to do fairly well all things considered with this wound. He is currently utilizing Medihoney Santyl would be extremely expensive for the amount he would need and likely cost prohibitive. 03/29/16; we'll follow this patient on an every two-week basis. He has a fairly substantial stage 3 pressure ulcer over his lower sacrum and coccyx and extending into his bilateral gluteal areas left greater than right. He now has home health. I think [facility]. He is applying Medihoney, kerlix and border foam. He arrives today with the intake nurse reporting a large amount of drainage. The patient stated he put his dressing on it 7:00 this morning by the time he arrived here at 10 there was already a moderate to a large amount of drainage. I once again reviewed his history. He had an attempted closure with myocutaneous flap earlier this year at [facility]. This did not go well. He was offered a diverting colostomy but refused. He is not a candidate for a wound VAC as the actual wound is precariously close to his anal opening. As mentioned he does have [facility] but miraculously this patient who is a paraplegic is actually changing the dressings himself. 04/12/16 patient presents today for a follow-up of his essentially large sacral pressure ulcer stage III. Nothing has changed dramatically since I last saw him about one month ago. He has seen Dr. Leanord Hawking once the interim. With that being said patient's wound appears somewhat less macerated today compared to previous evaluations. He still has no pain being a quadriplegic. 04-26-16 Mr. Medford returns today for a violation of his stage III sacral pressure ulcer he denies any complaints concerns  or issues over the past 2 weeks. He missed to changing dressing twice daily due to drainage although he states this is not an increase in drainage over the past 2 weeks. He does change his dressings independently. He admits to sitting in his motorized chair for no more than 2-3 hours at which time he transfers to bed and rotates lateral position. 05/10/16; Bradley [name] returns today for review of his stage III sacral pressure ulcer. He denies any concerns over the last 2 weeks although he seems to be running out of Aquacel Ag and on those days he uses Medihoney. He  has [facility] was supplying his dressings. He still complains of drainage. He does his dressings independently. He has in his motorized chair for 2-3 hours that time other than that he offloads this. Dimensions of the wound are down 1 cm in both directions. He underwent an aggressive debridement on his last visit of thick circumferential skin and subcutaneous tissue. It is possible at some point in the future he is going to need this done again 05/24/16; the patient returns today for review of his stage III sacral pressure ulcer. We have been using Aquacel Ag he tells me that he changes this up to twice a day. I'm not really certain of the reason for this frequency of changing. He has some involvement from the home health nurses but I think is doing most of the changing himself which I think because of his paraplegia would be a very difficult exercise. Nevertheless he states that there is "wetness". I am not sure if there is another dressing that we could easily changed that much. I'd wanted to change to [facility] but I'll need to have a sense of how frequent he would need to change this. 06/14/16; this is a patient returns for review of his stage III sacral pressure ulcer. We have been using Aquacel Ag and over the last 2 visits he has had extensive debridement so of the thick circumferential skin and subcutaneous tissue that  surrounds this wound. In spite of this really absolutely no change in the condition of the wound warrants measurements. We have [facility] home health I believe changing the dressing on 3 occasions the patient states he does this on one occasion himself 06/28/16; this is a patient who has a fairly large stage III sacral pressure ulcer. I changed him to [facility] from Aquacel 2 weeks ago. He returns today in follow-up. In the meantime a nurse from [facility] has calledrequesting ordering of a wound VAC. He had this discussion before. The problem is the proximity of the lowest edge of this wound to the patient's anal opening roughly 3/4 of an inch. Can't see how this can be arranged. Apparently the nurse who is calling has a lot of experience, the question would be then when she is not available would be doing this. I would not have thought that this wound is not amenable to a wound VAC because of this reason 07/12/16; the patient comes in today and I have signed orders for a wound VAC. The home health team through [facility] is convinced that he can benefit from this even though there is close proximity to his anal opening beneath the gluteal clefts. The patient does not have a bowel regimen but states he has a bowel movement every 2 days this will also provide some problem with regards to the vac seal 07/26/16; the patient never did obtain a [place] wound VAC as he could not afford the $200 per month co-pay we have been using Hydrofera Blue now for 6 weeks or so. No major change in this wound at all. He is still not interested in the concept of plastic surgery. There changing the dressing every second day 08/09/16; the patient arrives with a wound precisely in the same situation. In keeping with the plan I outlined last time extensive debridement with an open curet the surface of this is not completely viable. Still has some degree of surrounding thick skin and subcutaneous tissue. No evidence of  infection. Once again I have had a conversation with  him about plastic surgery, he is simply not interested. 08/23/16; wound is really no different. Thick circumferential skin and subcutaneous tissue around the wound edge which is a lot better from debridement we did earlier in the year. The surface of the wound looks viable however with a curet there is definitely a gritty surface to this. We use Medihoney for a while, he could not afford Santyl. I don't think we could get a supply of Iodoflex. He talks a little more positively about the concept of plastic surgery which I've gone over with him today 08/31/16;; patient arrives in clinic today with the wound surface really no different there is no changes in dimensions. I debrided today surface on the left upper side of this wound aggressively week ago there is no real change here no evidence of epithelialization. The problem with debridement in the clinic is that he believes from this very liberally. We have been using Sorbact. 09/21/16; absolutely no change in the appearance or measurements of this wound. More recently I've been debrided in this aggressively and using sorbact to see if we could get to a better wound surface. Although this visually looks satisfactory, debridement reveals a very gritty surface to this. However even with this debridement and removal of thick nonviable skin and subcutaneous tissue from around the large amount of the circumference of this wound we have made absolutely no progress. This may be an offloading issue I'm just not completely certain. It has 2 close proximity in its inferior aspect to consider negative pressure therapy 10/26/16; READMISSION This patient called our clinic yesterday to report an odor in his wound. He had been to see plastic surgery at [facility] at our request after his last visit on 09/21/16; we have been seeing him for several months with a large stage III wound. He had been sent to general surgery for  consideration of a colostomy, that appointment was not until mid June He comes in today with a temperature of 101. He is reporting an odor in the wound since last weekend. 01/10/17 Readmission: 01/10/17 On evaluation today it is noted that patient has been seen by plastic surgery at [facility] since he was last evaluated here. They did discuss with him the possibility of a flap according to the notes but unfortunately at this point he was not quite ready to proceed with surgery and instead wanted to give the Wound VAC a try. In the hospital they were able to get a good seal on the Wound VAC. Unfortunately since that time they have been having trouble in regard to his current home health company keeping a simple on the Wound VAC. He would like to switch to a different home health company. With that being said it sounds as if the problem is that his wound VAC is not feeling at the lower portion of his back and he tells me that he can take some of the clear plastic and put over that area when the sill breaks and it will correct it for time. He has no discomfort or pain which is good news. He has been treated with IV vancomycin since he was last seen here and has an appointment with a infectious disease specialist in two days on 01/12/17. Otherwise he was transferred back to [place] for continuing to monitor and manage is wound as she progresses with a Wound VAC for the time being. 01/17/17 on evaluation today patient continues to show evidence of slight improvement with the Wound VAC fortunately there's no  evidence of infection or otherwise worsening condition in general. Nonetheless we were unable to get him switch to [facility] in regard to home help from his current company. I'm not sure the reasoning behind but for some reason he was not accepted as a patient with him. Continue to apply the Wound VAC which does still show that some maceration around the wound edges but the wound measurements were slightly  improved. No fevers, chills, nausea, or vomiting noted at this Bradley [name], Bradley [name] ([id number]) [id number] Page 10 of 16 time. 02/14/17; this patient I have not seen in 5 months although he has been readmitted to our clinic seen by our physician assistant Allen Derry twice in early August. I have looked through [facility] notes care everywhere. The patient saw plastic surgery in May [Dr. Bhatt}. The patient was sent to general surgery and ultimately had a colostomy placed. On 11/29/16. This was after he was admitted to [facility] sometime in May. An MRI of the pelvis on 5/23 showed osteomyelitis of the coccyx. An attempt was made to drain fluid that was not successful. He was treated with empiric broad-spectrum antibiotics VAC/cefepime/Flagyl starting on 11/02/16 with plans for a 6 week course. According to their notes he was sent to a nursing home. Was last seen by Dr. Almedia Balls of plastic surgery on 12/28/16. The first part of the note is a long dissertation about the difficulties finding adequate patients for flap closure of pressure ulcers. At that time the wound was noted to be stage IV based I think on underlying infection no exposed bone and healthy granulation tissue. 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 He was last seen by infectious disease 01/12/17 A Dr. Annye Asa. His note says that Mr. Slimp was not interested in a flap closure for referring a trial of the wound VAC. As previously anticipated the wound VAC could not be maintained as an outpatient in the community. He is now using something similar to a Dakin's wet to dry recommended by [facility] VASHE solution. He is placing this twice a day himself. This is almost s hopeless setting in terms of heeling 02/28/17; he is using a Dakin's wet to dry. Most of the wound surface looks satisfactory however the deeper area over his coccyx now has exposed bone I'm not sure if I noted this last  week. 03/21/17; patient is usingVASHE solution wet to dry which I gather is a variation on Dakin solution. He has home health changing this 3 times a week the other days he does this himself. His appointment with plastic surgery 04/18/17; patient continues to use a variant of Dakin solution I believe. His wound continues to have a clean viable surface. The 2 areas of exposed bone in the center of this wound had closed over. He has an appointment with plastic surgery on December 5 at which time I hope that there'll be a plan for myocutaneous flap closure In looking through [facility] link I couldn't find any more plastic surgery appointments. I did come across the fact that he is been followed by hematology for a microcytic hypochromic anemia. He had a reasonably normal looking hemoglobin electrophoresis. His iron level was 10 and according to the patient he is going for IV iron infusions starting tomorrow. He had a sedimentation rate of 74. More problematically from a pure wound care point of view his albumin was 2.7 earlier this month 05/17/17; this is a patient I follow monthly. He has a  large now stage IV wound over his bilateral buttocks with close proximity to his anal opening. More recently he has developed a large area with exposed bone in the center of this probably secondary to the underlying osteomyelitis E had in the summer. He also follows with Dr. Almedia Balls at [facility] who is plastic surgery. He had an appointment earlier this month and according to the patient Dr. Almedia Balls does not want to proceed with any attempted flap closure. Although I do not have current access to her note in care everywhere this is likely due to exposed bone. Again according to the patient they did a bone biopsy. He is still using a variant of Dakin solution changing twice a day. He has home health. The patient is not able to give me a firm answer about how long he spends on this in his wheelchair The patient also states  that Dr. Almedia Balls wanted to reconsider a wound VAC. I really don't see this as a viable option at least not in the outpatient setting. The wound itself is frankly to close to his anal opening to maintain a seal. The last time we tried to do this home health was unable to manage it. It might be possible to maintain a wound VAC in this setting outside of the home such as a skilled nursing facility or an LTAC however I am doubtful about this even in that setting **** READMISSION 09/21/17-He is here for evaluation of stage IV sacral ulcer. Since his last evaluation here in December he has completed treatment for sacral osteomyelitis. He was at [facility] healthcare for IV therapy and NPWT dressing changes. He was discharged, with home health services, in February. He admits that while in the skilled facility he had "80%" success with maintaining dressing, since discharge he has had approximately "40%" success with maintaining wound VAC dressing. We discussed at length that this is not a safe or recommended option. We will apply Dakin's wet to dry dressing daily and he will follow-up next week. He is accompanied today by his sister who is willing to assist in dressing changes; they will discuss the social issue as he feels he is capable of changing dressing daily when home health is not able. 09/28/17-He is here in follow-up evaluation for stage IV sacral pressure ulcer. He has been using the Dakin's wet-to-dry daily; he continues with home health. He is not accompanied by anyone at this visit. He will follow up in two weeks per his request/preference. 10/12/17 on evaluation today patient appears to be doing very well. The Dakin solution went to dry packings do seem to be helping him as far as the sacral wound is concerned I'm not seeing anything that has me more concerned as far as infection or otherwise is concerned. Overall I'm pleased with the appearance of the wound. 10/26/17-He is here in follow up  evaluation for a stage IV sacral ulcer. He continues with daily Dakin's wet-to-dry. He is voicing no complaints or concerns. He will follow-up in 2 weeks 11/16/17-He is here for follow up evaluation for a palliative stage IV sacral pressure ulcer. We will continue with Dakin's wet-to-dry. He will follow-up in 4 weeks. He is expressing concern/complaint regarding new bed that has arrived, stating he is unable to manipulate/maneuver it due to the bed crank being at the foot of the bed. 12/14/17-He is here for evaluation for palliative stage IV sacral ulcer. He is voicing no complaints or concerns. We will continue with one-to-one ratio of saline and  dakins. He will follow-up in 3 weeks 01/04/18-He is here in follow-up evaluation for palliative stage IV sacral ulcer. He is inquiring about reinstating the negative pressure wound therapy. We discussed at length that the negative pressure wound therapy in the home setting has not been successful for him repeatedly with loss of cereal and unavailability of 24/7 help; reminded him that home health is not available 24/7 when loss of seal occurs. He does verbalize understanding to this and does not pursue. We also discussed the palliative nature of this ulcer (given no significant change/improvement in measurement/appearance, not a candidate for muscle flap per plastic surgery, and continued independent living) and that the goal is for maintenance, decrease in infection and minimizing/avoiding deterioration given that he is independent in his care, does not have home health and requires daily dressing changes secondary to drainage amount. He is inquiring about a wound clinic in [facility], I have informed him that I am unfamiliar with that clinic but that he is encouraged to seek another opinion if that is his desire. We will continue with dakins and he will follow up in three weeks 01/25/18-He is here in follow-up evaluation for palliative stage IV sacral ulcer. He  continues with Dakin's/saline 1:1 mixture wet to dry dressing changes. He states he has an appointment at [facility] on 9/17 for evaluation of surgical intervention/closure of the sacral ulcer. He will follow-up here in 4 weeks Readmission: 07/16/2019 upon evaluation today patient appears to be doing really about the same as when he was previously seen here in the wound care center. He most recently was a patient of Bradley [name] back when she was still working here in the center but had been referred to [facility] for consideration of a flap. With that being said the surgeon there at [facility] stated that this was too large for her flap and they have been attempting to get this smaller in order to be able to proceed with a flap. Nonetheless unfortunately he has had a cycle of going back and forth between the osteomyelitis flaring and then sent him back and then making a little bit of progress only to be sent back again. It sounds like most recently they have been using a Iodoflex type dressing at this point which does not seem to have done any harm by any means. With that being said this wound seems to be quite large for using Iodoflex throughout and subsequently I think he may do much better with the use of Vioxx moistened gauze which would be safe for the new tissue growing and also keep the wound quite nice and clean. The patient is not opposed to this he in fact states that his home health nurse had mentioned this as a possibility as well. 07/23/2019 upon evaluation today patient actually appears to be doing very well in regard to his sacral wound. In fact this is much healthier the measurements not terribly different but again with a wound like this at home necessarily expect a a lot of change as far as the overall measurements are concerned in just 1 week's time. This is going be a much longer term process at this point. With that being said I do think that he is very healthy appearing as far as the base of the wound is  concerned. 08/06/2019 upon evaluation today patient actually appears to be doing about the same with regard to his wound to be honest. There is really not a significant improvement overall based on what I am seeing  today. Fortunately there is no signs of significant systemic infection. No fevers, chills, nausea, vomiting, or diarrhea. With that being said there is odor to the drainage from the wound and subsequently also what appears to be increased drainage based on what we are Bradley [name], Bradley [name] ([id number]) [id number] Page 11 of 16 seeing today as well as what his home health nurse called [place] about that she was concerned with as well. 08/13/2019 upon evaluation today patient appears to be doing about the same at this point with regard to his wound although I think the dressing may be a little less drainage wise compared to what it was previous. Fortunately there is no signs of active infection at this time. No fevers, chills, nausea, vomiting, or diarrhea. He has been taking the antibiotic for only 3 days. 08/23/2019 upon evaluation today patient appears to be doing not nearly as well with regard to his wound. In general really not making a lot of progress which is unfortunate. There does not appear to be any signs of active infection at this time. No fevers, chills, nausea, vomiting, or diarrhea. With that being said the patient unfortunately does seem to be experiencing continued epiboly around the edges of the wound as well as significant scar tissue he has been in the majority of his day sitting in his chair which is likely a big reason for all of this. Fortunately there is no signs of active infection at this time. No fevers, chills, nausea, vomiting, or diarrhea. 08/29/2019 upon evaluation today patient's wound actually appears to be showing some signs of improvement. The region of few areas of new skin growth around the edges of the wound even where he has some of the  epiboly. This is actually good news and I think that he has been very aggressive and offloading over the past week since we showed him the pictures of his wound last week. There still may be a chance that he is going require some sharp debridement to clear away some of these edges but to be honest I think that is can be quite an undertaking. The main reason is that he has a lot of thickened scar tissue and it is good to bleed quite significantly in my opinion. The I think if organ to do that we may even need to have a portable or disposable cautery in order to make sure that we are able to get the area completely sealed up as far as bleeding is concerned. I do have one that we can utilize. However being that the wound looks so much better right now would like to give this 2 weeks to see how things stand and look at that point before making any additional recommendations. 09/12/2019 upon evaluation today patient appears to be making some progress as far as offloading is concerned. He still has a substantial wound but nonetheless he tells me he is off loading much more aggressively at this point. This is obviously good news. No fevers, chills, nausea, vomiting, or diarrhea. 4/22; I have not seen this patient in quite some time. No major change in the condition of the wound or its wound volume. Surrounding maceration of the skin around the edges of the wound. The wound is fairly substantial. T close to the anal opening to consider a wound VAC. He had extensive trials of plastic o surgery at [facility] which really does not result in any improvement. His wound bed is however clean 10/10/2019 upon evaluation today patient appears  to be doing well with regard to the wound in the sacral region. He has been tolerating the dressing changes without complication. Fortunately there is no signs of infection and he actually has some epithelial growth noted as well. He tells me has been trying to continue to offload  this area which is excellent that is what he needs to do most as far as what he can have in his control. Otherwise I feel like the collagen with a saline moistened gauze behind has been beneficial for him. 5/20; collagen and normal saline wet-to-dry. Although the tissue when this large sacral wound looks reasonable there is been absolutely no benefit to the overall closure. He has macerated skin around this. He tells me that he spends a large part of the day up in the wheelchair. He still has home health 11/07/2019 upon evaluation today patient appears to be doing well with regard to his wounds at this point. Fortunately there is no signs of active infection at this time. Overall I feel like his sacral wound in general appears to be doing quite nicely. I am very pleased with overall how things are progressing and I feel like the patient is making excellent progress towards resolution. With that being said he still has a long ways to go before we expect to see this completely healed. 11/21/2019 upon evaluation today patient appears to be doing about the same in regard to his sacral wound. Unfortunately he is really not making much in the way of improvement when I questioned him about how much time he is really spending sitting on his bottom he tells me that the majority of his day is mainly in that position though he does spend some time in bed. It does not sound like it is enough however neither does it look like it is enough. 12/24/2019 upon evaluation today patient appears to be doing really about the same there is no significant improvement overall in his wound bed. Last time he was seen here we did have a discussion about the possibility of seeing if I can get a surgeon to evaluate and potentially clear away some of the thickened edges of the wound in order to allow this wound to potentially have a chance of closing with a wound VAC. Right now we could not even get a wound VAC to seal with some of the  rolled and thickened edges. With that being said elected to the patient to think about this to discuss today. At this point the patient does want to see if I could make the referral to potentially see if the surgeon would be able to perform this and subsequently he agrees to go to a skilled nursing facility following if they are able to do this in order to allow for a wound VAC and to get this wound to heal more effectively and quickly. In my opinion the only way that I would recommend that he see the surgeon to clear away the edges would be if he is also in agreement with going to skilled nursing facility as I know he cannot appropriately offload at home to take care of this and that setting. 01/07/2020 on evaluation today patient appears to be doing about the same in regard to his wound. There is no signs of active infection at this time which is great news. All in all he is really doing about the same. We have made referral to surgery to see if they can potentially perform debridement to clear away  some of the scarred edging of the wound so we could potentially see about a wound VAC to try to help this heal more efficiently. Again we have discussed that if this occurs he would be in a skilled nursing facility following or release would need to be in order to appropriately offload. Nonetheless we are still in the process of working that referral. 01/21/2020 upon evaluation today patient appears to be doing about the same in regard to his sacral wound. He did see Dr. Tonna Boehringer Dr. Geoffery Lyons feeling was that there was not anything surgically he could offer at this point as he did not feel like that a wound VAC would likely be appropriate for the patient. With that being said he did recommend potential for trying to get the patient into a skilled nursing facility as an outpatient and they will get me working on that as well. Fortunately there is no signs of active infection at this time. 11/10; this is a patient  that has not been seen here in quite a bit of time while we were in our alternative venue. As well I have not seen this patient in probably over 2 years. As I remember things with him he has had this pressure ulcer encompassing his lower sacrum and coccyx and the surrounding buttocks for a prolonged period of time. When he first came here the surface looked fairly good and the wound was smaller we attempted a wound VAC but we could never keep this in place. Because of nonresponse of the wound to topical dressings we referred him to [facility] plastic surgery where preparation was being made for flap closure. Intact he had a colostomy however after that he was deemed to have 2 large wound for flap closure. He did have osteomyelitis in the coccyx area in 2019. He is using wet-to-dry he has home health. They are managing his ostomy supplies as well. The wound is substantially larger than what I remember. Versus last time he was here to wound months or so ago he has an unstageable wound on the left upper thigh posteriorly. He tells me he is in his chair quite a bit especially recently 12/1; patient comes back to clinic. He did not get the lab work done apparently home health would not do this they referred him to the lab where he is apparently having a CBC checked because he is on iron infusions. We will try to get the lab work I ordered done there. He also did not get the x-ray he says he does not remember the discussion. The issue is that this large wound has probing areas to bone. He may have underlying osteomyelitis again he was treated for this previously I think in 2019 12/15; the patient did have lab work done. Notable for iron deficiency anemia with a hemoglobin of 9.7 although he is following with hematology for this his CRP was 7.1 which is up from his previous value. sedimentation rate of 91 which is also higher. However he maintains a relatively high sedimentation rate. Most disturbingly  was an albumin of 2.8 although this is also somewhat higher than the 2.2 we had previously. With some difficulty I think a home health nurse actually made him some Dakin's solution he is using a Dakin's wet-to-dry to the large sacral wound. He has the area on the left upper thigh as well 06/17/2020; I follow this patient on a monthly basis. 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 At one point he had underlying osteomyelitis here and he received antibiotic therapy. I believe this was done through [facility] We have been using palliative Dakin's solution wet-to-dry on this which is really This very large wound about the same with healthy surface. No active infection that is obvious. He has developed an area on the left posterior thigh and now a mirror image area on the right posterior thigh noted pressure injuries from the wheelchair. This occurs even though he has a Roho cushion which is apparently new I have been forwarded lab work from 05/15/2020. At that point his hemoglobin was 9.2 down from 13.8 on 10/5 this is microcytic hypochromic. He has known iron deficiency although the exact cause of this is never really been clear to me. He follows with hematology oncology and has been receiving IV iron he is DEMITRIOS, MOLYNEUX ([id number]) [id number] Page 12 of 16 supposed to follow-up in mid January and I told the patient this. From wound care point of view his albumin is 2.9 sedimentation rate is 74. His sedimentation rate has always been elevated in this range however his C- reactive protein was 82 which is way above anything previously measured. I see that on 05/08/2020 this was 7.1 I am not sure why the rapid discrepancy. He is supposed to have an x-ray of his lower sacrum looking for evidence of osteomyelitis that he was previously treated for. Is possible he would also require more advanced imaging such as another C CT or MRI He comes in also with  paperwork for a Cl[place]ron air-fluidized bed with a trapeze bar. I asked him if he would be able to transfer in and out of this bed . I am not opposed to ordering it, but I do not want to make his transfers more difficult than they already are. He has a Nurse, adult 07/15/2020; he is managed to get his Cl[place]ron air-fluidized bed. He has a new electric wheelchair with a wheelchair cushion. The x-ray I did showed some sacral bone destruction although this did not seem progressive him last time. He was treated for osteomyelitis in this area previously in 2018. His sedimentation rate is 91 CRP 7.1 albumin 2.8. We are using Dakin's wet-to-dry. He has [facility] home health 4/6; 52-month follow-up. He has a Cl[place]ron air-fluidized bed. He has the wheelchair cushion. His major wound on the coccyx looks somewhat better including including both of [place] my nurse and my feeling that it is closed in somewhat. Wound is still too close to his anal opening to consider a wound VAC. At 1 point we were trying to get him into [facility] to get a flap closure but for 1 reason or another this never moved forward I was never really certain what the issue was. He has an area on the left upper thigh in the gluteal fold. This is a clean wound. 6/1; 69-month follow-up. I follow this man on a palliative basis he has a chronic stage IV wound over the lower sacrum coccyx and surrounding buttocks. He has an air-fluidized bed and a wheelchair cushion but he spends more time in the wheelchair than he does off this area by his own admission. I do not think the area is really changed that much although it is probably deeper in 1 section. He does tell us that he was discharged from home health because of lack of change of the wound. This was [employer]. He was treated for underlying osteomyelitis several years ago and he may have  underlying chronic osteomyelitis. I do not believe he ever got the x-ray I ordered 8/3; patient presents for his  63-month follow-up. He reports no issues or complaints today. He continues to use Dakin's wet-to-dry dressings to his sacrum and silver alginate to his left lateral thigh wound. He denies signs of infection. 10/5; patient presents for 43-month follow-up. He has no issues or complaints today. He continues to use Dakin's wet-to-dry dressings to his sacrum. He states that the left lateral thigh wound is healed. He denies signs of infection. 12/7; patient presents for 6-month follow-up. He states he would like to obtain a new air loss mattress bed. He states his current one is having technical difficulties. He currently is using Dakin's wet-to-dry dressings. He denies signs of infection. 2/1; patient presents for follow-up. He has no issues or complaints today. He currently uses Dakin's wet-to-dry dressings. He denies signs of infection. He has his new air loss mattress. 4/5; patient presents for follow-up. He has been using Dakin's wet-to-dry dressings. He has no issues or complaints today. He denies signs of infection. 11-10-2021 upon evaluation today patient's wound is doing better since I last saw him although it has been quite sometime since then to be perfectly honest. In fact I think it may have been even back in 2022 if I am not mistaken. Either way he has been seeing Dr. Mikey Bussing roughly every 2 months per his request. His wound still though smaller does have a significant amount of thick scar tissue surrounding the edge which is making it very difficult to heal to be honest. His son is performing the dressing changes at home currently. 8/23; patient presents for follow-up. He has no issues or complaints today. He has been doing Dakin's wet-to-dry dressings. He is not interested in any further surgical intervention for his sacral wound. He denies signs of infection. 11/15; Patient presents for follow up. He has been using Dakins wet to dry dressings. He has no issues or complaints. 2/7; patient  presents for his regular 62-month follow-up. He continues to use Dakin's wet-to-dry dressings. He has no issues or complaints today. 4/10; patient presents for follow-up. He has been using Dakin's wet-to-dry dressings. He has no issues or complaints. He denies signs of infection. 6/12; patient presents for follow-up. He has been using Dakin's wet-to-dry dressings to the sacral wound. He has a small area of skin breakdown to the left posterior leg and has been keeping this covered. He has no issues or complaints today. He denies signs of infection. 6/26; patient presents for follow-up. He has been using Dakin's wet-to-dry dressings to the sacral wound he now has another area of skin breakdown just above the previous left posterior leg wound. Appears to be from friction. He has been using Hydrofera Blue to these wounds. Insurance is asking patient to be seen weekly in order for him to keep his air mattress. After 5 weeks he can go out 2 months per patient Report. 7/3; patient presents for follow-up. He has been using Dakin's wet-to-dry dressings to the sacral wound and Hydrofera Blue to the left posterior leg wound. 7/10; patient presents for follow-up. He has been using Dakin's wet-to-dry dressings to the sacral wound and Hydrofera Blue with Medihoney to the left posterior leg wound. Wounds are stable. 7/17; patient presents for follow-up. He has been using Dakin's wet-to-dry dressings to the sacral wound and Hydrofera Blue with antibiotic ointment to the left posterior leg wound. He has been sitting up more in his chair this  past week. He has no issues or complaints today. 7/24; patient presents for follow-up. He has been using Dakin's wet-to-dry dressings to the sacral wound and Hydrofera Blue with antibiotic ointment to the left posterior leg wound. He has no issues or complaints. Objective Constitutional Vitals Time Taken: 2:59 PM, Temperature: 98.4 F, Pulse: 91 bpm, Respiratory Rate: 18  breaths/min, Blood Pressure: 100/67 mmHg. General Notes: Sacral ulcer: Granulation tissue throughout. Not all areas of the wound bed appears healthy. No probing to bone. No obvious signs of infection. Open wounds to the posterior left leg with granulation tissue and scant nonviable tissue. No signs of infection. Integumentary (Hair, Skin) ANTIONNE, ENRIQUE A ([id number]) [id number] Page 13 of 16 Wound #10 status is Open. Original cause of wound was Pressure Injury. The date acquired was: 06/07/2015. The wound has been in treatment 180 weeks. The wound is located on the Midline Sacrum. The wound measures 7cm length x 4cm width x 3.5cm depth; 21.991cm^2 area and 76.969cm^3 volume. There is muscle and Fat Layer (Subcutaneous Tissue) exposed. There is a medium amount of serosanguineous drainage noted. The wound margin is epibole. There is large (67-100%) red, pink granulation within the wound bed. There is a small (1-33%) amount of necrotic tissue within the wound bed including Adherent Slough. Wound #14 status is Open. Original cause of wound was Shear/Friction. The date acquired was: 11/05/2022. The wound has been in treatment 6 weeks. The wound is located on the Left,Posterior Upper Leg. The wound measures 6cm length x 3cm width x 0.3cm depth; 14.137cm^2 area and 4.241cm^3 volume. There is Fat Layer (Subcutaneous Tissue) exposed. There is a medium amount of serous drainage noted. The wound margin is thickened. There is medium (34-66%) pink granulation within the wound bed. There is a small (1-33%) amount of necrotic tissue within the wound bed including Adherent Slough. Assessment Active Problems ICD-10 Pressure ulcer of sacral region, stage 4 Pressure ulcer of other site, stage 2 Unspecified open wound, left lower leg, initial encounter Other chronic osteomyelitis, other site Paraplegia, unspecified Patient's sacral wound is stable. I recommended continuing Dakin's wet-to-dry  dressings here. The left posterior leg wound is larger. He is not able to offload the area well. He sits in his chair for long periods of time. Shearing appears to also be happening. We discussed the importance of offloading and being careful with transfer. Also recommended increased protein intake for aiding in wound healing. Can continue Hydrofera Blue and antibiotic ointment here. Follow-up in 1 week. Plan Follow-up Appointments: Return Appointment in 1 week. Bathing/ Shower/ Hygiene: May shower; gently cleanse wound with antibacterial soap, rinse and pat dry prior to dressing wounds Off-Loading: Roho cushion for wheelchair Low air-loss mattress (Group 2) Turn and reposition every 2 hours WOUND #10: - Sacrum Wound Laterality: Midline Cleanser: Dakin 16 (oz) 0.25 1 x Per Day/30 Days Discharge Instructions: Use as directed. Prim Dressing: Gauze (Generic) 1 x Per Day/30 Days ary Discharge Instructions: As directed: moistened with Dakins Solution Secondary Dressing: Zetuvit Plus 4x4 (in/in) 1 x Per Day/30 Days Secured With: Medipore T - 45M Medipore H Soft Cloth Surgical T ape ape, 2x2 (in/yd) (Dispense As Written) 1 x Per Day/30 Days WOUND #14: - Upper Leg Wound Laterality: Left, Posterior Topical: Mupirocin Ointment 1 x Per Day/30 Days Discharge Instructions: Apply as directed by provider. Prim Dressing: Hydrofera Blue Ready Transfer Foam, 2.5x2.5 (in/in) (Generic) 1 x Per Day/30 Days ary Discharge Instructions: Apply Hydrofera Blue Ready to wound bed as directed Secured With: T egaderm Film  Transparent 6x8 (in/in) 1 x Per Day/30 Days Discharge Instructions: Apply to wound bed 1. Hydrofera Blue 2. Mupirocin ointment 3. Dakin's wet-to-dry dressings 4. Aggressive offloading 5. Follow-up in 1 week Electronic Signature(s) Signed: 12/28/2022 4:29:46 PM By: Geralyn Corwin DO Entered By: Geralyn Corwin on 12/28/2022 15:48:23 [name] ([id number])  [id number] Page 14 of 16 -------------------------------------------------------------------------------- ROS/PFSH Details Patient Name: Date of Service: Bradley Pink MES A. 12/28/2022 2:45 PM Medical Record Number: [id number] Patient Account Number: [id number] Date of Birth/Sex: Treating RN: 1945/12/20 (77 y.o. Bradley [name], Bradley [name] Primary Care Provider: Marisue Ivan Other Clinician: Referring Provider: Treating Provider/Extender: Greta Doom in Treatment: 180 Label Progress Note Print Version as History and Physical for this encounter Information Obtained From Patient Eyes Medical History: Negative for: Cataracts; Glaucoma; Optic Neuritis Hematologic/Lymphatic Medical History: Positive for: Anemia Negative for: Hemophilia; Human Immunodeficiency Virus; Lymphedema; Sickle Cell Disease Respiratory Medical History: Negative for: Aspiration; Asthma; Chronic Obstructive Pulmonary Disease (COPD); Pneumothorax; Sleep Apnea; Tuberculosis Cardiovascular Medical History: Positive for: Hypertension Negative for: Angina; Arrhythmia; Congestive Heart Failure; Coronary Artery Disease; Deep Vein Thrombosis; Hypotension; Myocardial Infarction; Peripheral Arterial Disease; Peripheral Venous Disease; Phlebitis; Vasculitis Gastrointestinal Medical History: Negative for: Cirrhosis ; Colitis; Crohns; Hepatitis A; Hepatitis B; Hepatitis C Past Medical History Notes: colostomy Endocrine Medical History: Negative for: Type I Diabetes; Type II Diabetes Genitourinary Medical History: Negative for: End Stage Renal Disease Past Medical History Notes: frequent urination Immunological Medical History: Negative for: Lupus Erythematosus; Raynauds; Scleroderma Integumentary (Skin) Medical History: Positive for: History of pressure wounds Musculoskeletal HARSHIL, CAVALLARO ([id number]) [id number] Page 15 of  16 Medical History: Positive for: Rheumatoid Arthritis Negative for: Gout; Osteoarthritis; Osteomyelitis Neurologic Medical History: Positive for: Paraplegia Negative for: Dementia; Neuropathy; Quadriplegia; Seizure Disorder Oncologic Medical History: Negative for: Received Chemotherapy; Received Radiation Psychiatric Medical History: Negative for: Anorexia/bulimia; Confinement Anxiety Immunizations Pneumococcal Vaccine: Received Pneumococcal Vaccination: No Immunization Notes: up to date Implantable Devices None Hospitalization / Surgery History Type of Hospitalization/Surgery December 2020 [facility] Family and Social History Cancer: Yes - Mother; Diabetes: No; Heart Disease: Yes - Mother,Father; Hereditary Spherocytosis: No; Hypertension: No; Kidney Disease: No; Lung Disease: No; Seizures: Yes - Child; Stroke: No; Thyroid Problems: No; Tuberculosis: No; Former smoker; Marital Status - Single; Alcohol Use: Never; Drug Use: No History; Caffeine Use: Moderate - coffee; Financial Concerns: No; Food, Clothing or Shelter Needs: No; Support System Lacking: No; Transportation Concerns: No Electronic Signature(s) Signed: 12/28/2022 4:29:46 PM By: Geralyn Corwin DO Signed: 12/28/2022 4:46:32 PM By: Bonnell Public Entered By: Geralyn Corwin on 12/28/2022 15:49:54 -------------------------------------------------------------------------------- SuperBill Details Patient Name: Date of Service: Bradley Pink MES A. 12/28/2022 Medical Record Number: [id number] Patient Account Number: [id number] Date of Birth/Sex: Treating RN: [birth year] (77 y.o. Bradley [name], Bradley [name] Primary Care Provider: Marisue Ivan Other Clinician: Referring Provider: Treating Provider/Extender: Claudie Fisherman Weeks in Treatment: 180 Diagnosis Coding ICD-10 Codes Code Description L89.154 Pressure ulcer of sacral region, stage 4 L89.893 Pressure ulcer of other site, stage 3 S81.802A Unspecified  open wound, left lower leg, initial encounter BRYON, PARKER ([id number]) [id number] Page 16 of 16 M86.68 Other chronic osteomyelitis, other site G82.20 Paraplegia, unspecified Facility Procedures : CPT4 Code: [id number] Description: 99213 - WOUND CARE VISIT-LEV 3 EST PT Modifier: Quantity: 1 Physician Procedures : CPT4 Code Description Modifier [id number] - WC PHYS LEVEL 3 - EST PT ICD-10 Diagnosis Description L89.154 Pressure ulcer of sacral region, stage 4 L89.893 Pressure ulcer of other site, stage 3 S81.802A Unspecified open wound, left lower leg,  initial  encounter M86.68 Other chronic osteomyelitis, other site Quantity: 1 Electronic Signature(s) Signed: 12/28/2022 4:29:46 PM By: Geralyn Corwin DO Entered By: Geralyn Corwin on 12/28/2022 15:48:36

## 2022-12-29 DIAGNOSIS — S81809A Unspecified open wound, unspecified lower leg, initial encounter: Secondary | ICD-10-CM | POA: Diagnosis not present

## 2022-12-29 DIAGNOSIS — L89159 Pressure ulcer of sacral region, unspecified stage: Secondary | ICD-10-CM | POA: Diagnosis not present

## 2022-12-29 DIAGNOSIS — L24A9 Irritant contact dermatitis due friction or contact with other specified body fluids: Secondary | ICD-10-CM | POA: Diagnosis not present

## 2022-12-29 DIAGNOSIS — S31000A Unspecified open wound of lower back and pelvis without penetration into retroperitoneum, initial encounter: Secondary | ICD-10-CM | POA: Diagnosis not present

## 2022-12-29 DIAGNOSIS — S81802A Unspecified open wound, left lower leg, initial encounter: Secondary | ICD-10-CM | POA: Diagnosis not present

## 2022-12-29 DIAGNOSIS — N319 Neuromuscular dysfunction of bladder, unspecified: Secondary | ICD-10-CM | POA: Diagnosis not present

## 2022-12-29 DIAGNOSIS — L89154 Pressure ulcer of sacral region, stage 4: Secondary | ICD-10-CM | POA: Diagnosis not present

## 2022-12-29 DIAGNOSIS — Z933 Colostomy status: Secondary | ICD-10-CM | POA: Diagnosis not present

## 2022-12-29 DIAGNOSIS — K631 Perforation of intestine (nontraumatic): Secondary | ICD-10-CM | POA: Diagnosis not present

## 2023-01-04 ENCOUNTER — Encounter (HOSPITAL_BASED_OUTPATIENT_CLINIC_OR_DEPARTMENT_OTHER): Payer: Medicare HMO | Admitting: Internal Medicine

## 2023-01-04 DIAGNOSIS — L89892 Pressure ulcer of other site, stage 2: Secondary | ICD-10-CM | POA: Diagnosis not present

## 2023-01-04 DIAGNOSIS — G8220 Paraplegia, unspecified: Secondary | ICD-10-CM | POA: Diagnosis not present

## 2023-01-04 DIAGNOSIS — L97111 Non-pressure chronic ulcer of right thigh limited to breakdown of skin: Secondary | ICD-10-CM | POA: Diagnosis not present

## 2023-01-04 DIAGNOSIS — M8668 Other chronic osteomyelitis, other site: Secondary | ICD-10-CM | POA: Diagnosis not present

## 2023-01-04 DIAGNOSIS — I10 Essential (primary) hypertension: Secondary | ICD-10-CM | POA: Diagnosis not present

## 2023-01-04 DIAGNOSIS — L89154 Pressure ulcer of sacral region, stage 4: Secondary | ICD-10-CM

## 2023-01-04 DIAGNOSIS — D509 Iron deficiency anemia, unspecified: Secondary | ICD-10-CM | POA: Diagnosis not present

## 2023-01-04 DIAGNOSIS — L89893 Pressure ulcer of other site, stage 3: Secondary | ICD-10-CM

## 2023-01-04 DIAGNOSIS — L89153 Pressure ulcer of sacral region, stage 3: Secondary | ICD-10-CM | POA: Diagnosis not present

## 2023-01-04 DIAGNOSIS — S81802A Unspecified open wound, left lower leg, initial encounter: Secondary | ICD-10-CM

## 2023-01-04 DIAGNOSIS — L97129 Non-pressure chronic ulcer of left thigh with unspecified severity: Secondary | ICD-10-CM | POA: Diagnosis not present

## 2023-01-04 DIAGNOSIS — M069 Rheumatoid arthritis, unspecified: Secondary | ICD-10-CM | POA: Diagnosis not present

## 2023-01-06 NOTE — Progress Notes (Signed)
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 not seem to have done any harm by any means. With that  being said this wound seems to be quite large for using Iodoflex throughout and subsequently I think he may do much better with the use of Vioxx moistened gauze which would be safe for the new tissue growing and also keep the wound quite nice and clean. The patient is not opposed to this he in fact states that his home health nurse had mentioned this as [name] possibility as well. 07/23/2019 upon evaluation today patient actually appears to be doing very well in regard to his sacral wound. In fact this is much healthier the measurements not terribly different but again with [name] wound like this at home necessarily expect [name] [name] lot of change as far as the overall measurements are concerned in just 1 week's time. This is going be [name] much longer term process at this point. With that being said I do think that he is very healthy appearing as far as the base of the wound is concerned. 08/06/2019 upon evaluation today patient actually appears to be doing about the same with regard to his wound to be honest. There is really not [name] significant improvement overall based on what I am seeing today. Fortunately there is no signs  of significant systemic infection. No fevers, chills, nausea, vomiting, or diarrhea. With that being said there is odor to the drainage from the wound and subsequently also what appears to be increased drainage based on what we are Bradley [name], Bradley [name] ([id number]) [id number] Page 4 of 16 seeing today as well as what his home health nurse called [place] about that she was concerned with as well. 08/13/2019 upon evaluation today patient appears to be doing about the same at this point with regard to his wound although I think the dressing may be [name] little less drainage wise compared to what it was previous. Fortunately there is no signs of active infection at this time. No fevers, chills, nausea, vomiting, or diarrhea. He has been taking the antibiotic for only 3 days. 08/23/2019 upon  evaluation today patient appears to be doing not nearly as well with regard to his wound. In general really not making [name] lot of progress which is unfortunate. There does not appear to be any signs of active infection at this time. No fevers, chills, nausea, vomiting, or diarrhea. With that being said the patient unfortunately does seem to be experiencing continued epiboly around the edges of the wound as well as significant scar tissue he has been in the majority of his day sitting in his chair which is likely [name] big reason for all of this. Fortunately there is no signs of active infection at this time. No fevers, chills, nausea, vomiting, or diarrhea. 08/29/2019 upon evaluation today patient's wound actually appears to be showing some signs of improvement. The region of few areas of new skin growth around the edges of the wound even where he has some of the epiboly. This is actually good news and I think that he has been very aggressive and offloading over the past week since we showed him the pictures of his wound last week. There still may be [name] chance that he is going require some sharp debridement to clear away some of these edges but to be honest I think that is can be quite an undertaking. The main reason is that he has [name] lot of thickened scar tissue and it is good to bleed quite significantly in my opinion. The I think if organ to do that we may even need to have [name] portable or disposable cautery in order to make sure that we are able to get the area completely sealed up as far as bleeding is concerned. I do have one that we can utilize. However being that the wound looks so much better right now would like to give this 2 weeks to see how things stand and look at that point before making any additional recommendations. 09/12/2019 upon evaluation today patient appears to be making some progress as far as offloading is concerned. He still has [name] substantial wound but nonetheless he tells me he is off  loading much more aggressively at this point. This is obviously good news. No fevers, chills, nausea, vomiting, or diarrhea. 4/22; I have not seen this patient in quite some time. No major change in the condition of the wound or its wound volume. Surrounding maceration of the skin around the edges of the wound. The wound is fairly substantial. T close to the anal opening to consider [name] wound VAC. He had extensive trials of plastic o surgery at [facility] which really does not result in any improvement. His wound bed is however clean 10/10/2019 upon evaluation today patient appears to be doing well with  regard to the wound in the sacral region. He has been tolerating the dressing changes without complication. Fortunately there is no signs of infection and he actually has some epithelial growth noted as well. He tells me has been trying to continue to offload this area which is excellent that is what he needs to do most as far as what he can have in his control. Otherwise I feel like the collagen with [name] saline moistened gauze behind has been beneficial for him. 5/20; collagen and normal saline wet-to-dry. Although the tissue when this large sacral wound looks reasonable there is been absolutely no benefit to the overall closure. He has macerated skin around this. He tells me that he spends [name] large part of the day up in the wheelchair. He still has home health 11/07/2019 upon evaluation today patient appears to be doing well with regard to his wounds at this point. Fortunately there is no signs of active infection at this time. Overall I feel like his sacral wound in general appears to be doing quite nicely. I am very pleased with overall how things are progressing and I feel like the patient is making excellent progress towards resolution. With that being said he still has [name] long ways to go before we expect to see this completely healed. 11/21/2019 upon evaluation today patient appears to be doing about  the same in regard to his sacral wound. Unfortunately he is really not making much in the way of improvement when I questioned him about how much time he is really spending sitting on his bottom he tells me that the majority of his day is mainly in that position though he does spend some time in bed. It does not sound like it is enough however neither does it look like it is enough. 12/24/2019 upon evaluation today patient appears to be doing really about the same there is no significant improvement overall in his wound bed. Last time he was seen here we did have [name] discussion about the possibility of seeing if I can get [name] surgeon to evaluate and potentially clear away some of the thickened edges of the wound in order to allow this wound to potentially have [name] chance of closing with [name] wound VAC. Right now we could not even get [name] wound VAC to seal with some of the rolled and thickened edges. With that being said elected to the patient to think about this to discuss today. At this point the patient does want to see if I could make the referral to potentially see if the surgeon would be able to perform this and subsequently he agrees to go to [name] skilled nursing facility following if they are able to do this in order to allow for [name] wound VAC and to get this wound to heal more effectively and quickly. In my opinion the only way that I would recommend that he see the surgeon to clear away the edges would be if he is also in agreement with going to skilled nursing facility as I know he cannot appropriately offload at home to take care of this and that setting. 01/07/2020 on evaluation today patient appears to be doing about the same in regard to his wound. There is no signs of active infection at this time which is great news. All in all he is really doing about the same. We have made referral to surgery to see if they can potentially perform debridement to clear away some of the scarred edging of  the wound so we  could potentially see about [name] wound VAC to try to help this heal more efficiently. Again we have discussed that if this occurs he would be in [name] skilled nursing facility following or release would need to be in order to appropriately offload. Nonetheless we are still in the process of working that referral. 01/21/2020 upon evaluation today patient appears to be doing about the same in regard to his sacral wound. He did see Dr. Tonna Boehringer Dr. Geoffery Lyons feeling was that there was not anything surgically he could offer at this point as he did not feel like that [name] wound VAC would likely be appropriate for the patient. With that being said he did recommend potential for trying to get the patient into [name] skilled nursing facility as an outpatient and they will get me working on that as well. Fortunately there is no signs of active infection at this time. 11/10; this is [name] patient that has not been seen here in quite [name] bit of time while we were in our alternative venue. As well I have not seen this patient in probably over 2 years. As I remember things with him he has had this pressure ulcer encompassing his lower sacrum and coccyx and the surrounding buttocks for [name] prolonged period of time. When he first came here the surface looked fairly good and the wound was smaller we attempted [name] wound VAC but we could never keep this in place. Because of nonresponse of the wound to topical dressings we referred him to [facility] plastic surgery where preparation was being made for flap closure. Intact he had [name] colostomy however after that he was deemed to have 2 large wound for flap closure. He did have osteomyelitis in the coccyx area in 2019. He is using wet-to-dry he has home health. They are managing his ostomy supplies as well. The wound is substantially larger than what I remember. Versus last time he was here to wound months or so ago he has an unstageable wound on the left upper thigh posteriorly. He tells me he is in  his chair quite [name] bit especially recently 12/1; patient comes back to clinic. He did not get the lab work done apparently home health would not do this they referred him to the lab where he is apparently having [name] CBC checked because he is on iron infusions. We will try to get the lab work I ordered done there. He also did not get the x-ray he says he does not remember the discussion. The issue is that this large wound has probing areas to bone. He may have underlying osteomyelitis again he was treated for this previously I think in 2019 12/15; the patient did have lab work done. Notable for iron deficiency anemia with [name] hemoglobin of 9.7 although he is following with hematology for this his CRP was 7.1 which is up from his previous value. sedimentation rate of 91 which is also higher. However he maintains [name] relatively high sedimentation rate. Most disturbingly was an albumin of 2.8 although this is also somewhat higher than the 2.2 we had previously. With some difficulty I think [name] home health nurse actually made him some Dakin's solution he is using [name] Dakin's wet-to-dry to the large sacral wound. He has the area on the left upper thigh as well 06/17/2020; I follow this patient on [name] monthly basis. 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 At one point he had  underlying osteomyelitis here and he received antibiotic therapy. I believe this was done through [facility] We have been using palliative Dakin's solution wet-to-dry on this which is really This very large wound about the same with healthy surface. No active infection that is obvious. He has developed an area on the left posterior thigh and now [name] mirror image area on the right posterior thigh noted pressure injuries from the wheelchair. This occurs even though he has [name] Roho cushion which is apparently new I have been forwarded lab work from 05/15/2020. At that point his hemoglobin was 9.2 down from 13.8 on 10/5 this is  microcytic hypochromic. He has known iron deficiency although the exact cause of this is never really been clear to me. He follows with hematology oncology and has been receiving IV iron he is Bradley [name], Bradley [name] ([id number]) [id number] Page 5 of 16 supposed to follow-up in mid January and I told the patient this. From wound care point of view his albumin is 2.9 sedimentation rate is 74. His sedimentation rate has always been elevated in this range however his C- reactive protein was 82 which is way above anything previously measured. I see that on 05/08/2020 this was 7.1 I am not sure why the rapid discrepancy. He is supposed to have an x-ray of his lower sacrum looking for evidence of osteomyelitis that he was previously treated for. Is possible he would also require more advanced imaging such as another C CT or MRI He comes in also with paperwork for [name] Clinitron air-fluidized bed with [name] trapeze bar. I asked him if he would be able to transfer in and out of this bed . I am not opposed to ordering it, but I do not want to make his transfers more difficult than they already are. He has [name] Nurse, adult 07/15/2020; he is managed to get his Clinitron air-fluidized bed. He has [name] new electric wheelchair with [name] wheelchair cushion. The x-ray I did showed some sacral bone destruction although this did not seem progressive him last time. He was treated for osteomyelitis in this area previously in 2018. His sedimentation rate is 91 CRP 7.1 albumin 2.8. We are using Dakin's wet-to-dry. He has [facility] home health 4/6; 48-month follow-up. He has [name] Clinitron air-fluidized bed. He has the wheelchair cushion. His major wound on the coccyx looks somewhat better including including both of [place] my nurse and my feeling that it is closed in somewhat. Wound is still too close to his anal opening to consider [name] wound VAC. At 1 point we were trying to get him into [facility] plastics to get [name] flap closure but for 1  reason or another this never moved forward I was never really certain what the issue was. He has an area on the left upper thigh in the gluteal fold. This is [name] clean wound. 6/1; 69-month follow-up. I follow this man on [name] palliative basis he has [name] chronic stage IV wound over the lower sacrum coccyx and surrounding buttocks. He has an air-fluidized bed and [name] wheelchair cushion but he spends more time in the wheelchair than he does off this area by his own admission. I do not think the area is really changed that much although it is probably deeper in 1 section. He does tell us that he was discharged from home health because of lack of change of the wound. This was [employer]. He was treated for underlying osteomyelitis several years ago and he may have underlying chronic osteomyelitis. I do  not believe he ever got the x-ray I ordered 8/3; patient presents for his 59-month follow-up. He reports no issues or complaints today. He continues to use Dakin's wet-to-dry dressings to his sacrum and silver alginate to his left lateral thigh wound. He denies signs of infection. 10/5; patient presents for 66-month follow-up. He has no issues or complaints today. He continues to use Dakin's wet-to-dry dressings to his sacrum. He states that the left lateral thigh wound is healed. He denies signs of infection. 12/7; patient presents for 20-month follow-up. He states he would like to obtain [name] new air loss mattress bed. He states his current one is having technical difficulties. He currently is using Dakin's wet-to-dry dressings. He denies signs of infection. 2/1; patient presents for follow-up. He has no issues or complaints today. He currently uses Dakin's wet-to-dry dressings. He denies signs of infection. He has his new air loss mattress. 4/5; patient presents for follow-up. He has been using Dakin's wet-to-dry dressings. He has no issues or complaints today. He denies signs of infection. 11-10-2021 upon evaluation today  patient's wound is doing better since I last saw him although it has been quite sometime since then to be perfectly honest. In fact I think it may have been even back in 2022 if I am not mistaken. Either way he has been seeing Dr. Mikey Bussing roughly every 2 months per his request. His wound still though smaller does have [name] significant amount of thick scar tissue surrounding the edge which is making it very difficult to heal to be honest. His son is performing the dressing changes at home currently. 8/23; patient presents for follow-up. He has no issues or complaints today. He has been doing Dakin's wet-to-dry dressings. He is not interested in any further surgical intervention for his sacral wound. He denies signs of infection. 11/15; Patient presents for follow up. He has been using Dakins wet to dry dressings. He has no issues or complaints. 2/7; patient presents for his regular 77-month follow-up. He continues to use Dakin's wet-to-dry dressings. He has no issues or complaints today. 4/10; patient presents for follow-up. He has been using Dakin's wet-to-dry dressings. He has no issues or complaints. He denies signs of infection. 6/12; patient presents for follow-up. He has been using Dakin's wet-to-dry dressings to the sacral wound. He has [name] small area of skin breakdown to the left posterior leg and has been keeping this covered. He has no issues or complaints today. He denies signs of infection. 6/26; patient presents for follow-up. He has been using Dakin's wet-to-dry dressings to the sacral wound he now has another area of skin breakdown just above the previous left posterior leg wound. Appears to be from friction. He has been using Hydrofera Blue to these wounds. Insurance is asking patient to be seen weekly in order for him to keep his air mattress. After 5 weeks he can go out 2 months per patient Report. 7/3; patient presents for follow-up. He has been using Dakin's wet-to-dry dressings to the  sacral wound and Hydrofera Blue to the left posterior leg wound. 7/10; patient presents for follow-up. He has been using Dakin's wet-to-dry dressings to the sacral wound and Hydrofera Blue with Medihoney to the left posterior leg wound. Wounds are stable. 7/17; patient presents for follow-up. He has been using Dakin's wet-to-dry dressings to the sacral wound and Hydrofera Blue with antibiotic ointment to the left posterior leg wound. He has been sitting up more in his chair this past week. He has no  issues or complaints today. 7/24; patient presents for follow-up. He has been using Dakin's wet-to-dry dressings to the sacral wound and Hydrofera Blue with antibiotic ointment to the left posterior leg wound. He has no issues or complaints. 7/31; patient presents for follow-up. He has been using Dakin's wet-to-dry dressings to the sacral wound and Hydrofera Blue with antibiotic ointment to the left posterior leg wound. Left leg wound has more healthy granulation tissue present today. He has no issues or complaints. He would like [name] different air mattress. Electronic Signature(s) Signed: 01/04/2023 4:29:16 PM By: Geralyn Corwin DO Entered By: Geralyn Corwin on 01/04/2023 15:58:38 [name] ([id number]) [id number] Page 6 of 16 -------------------------------------------------------------------------------- Physical Exam Details Patient Name: Date of Service: Bradley [name]. 01/04/2023 2:45 PM Medical Record Number: [id number] Patient Account Number: [id number] Date of Birth/Sex: Treating RN: Oct 13, 1945 (77 y.o. [name] Primary Care Provider: Marisue Ivan Other Clinician: Betha Loa Referring Provider: Treating Provider/Extender: Claudie Fisherman Weeks in Treatment: 181 Constitutional . Cardiovascular . Psychiatric . Notes Sacral ulcer: Granulation tissue throughout. Not all areas of the wound bed appears healthy. No  probing to bone. No obvious signs of infection. Open wounds to the posterior left leg with granulation tissue and scant nonviable tissue. No signs of infection. Electronic Signature(s) Signed: 01/04/2023 4:29:16 PM By: Geralyn Corwin DO Entered By: Geralyn Corwin on 01/04/2023 15:58:59 -------------------------------------------------------------------------------- Physician Orders Details Patient Name: Date of Service: Bradley [name]. 01/04/2023 2:45 PM Medical Record Number: [id number] Patient Account Number: [id number] Date of Birth/Sex: Treating RN: 09/08/1945 (77 y.o. [name] Primary Care Provider: Marisue Ivan Other Clinician: Betha Loa Referring Provider: Treating Provider/Extender: Greta Doom in Treatment: [phone] Verbal / Phone Orders: Yes Clinician: Huel Coventry Read Back and Verified: Yes Diagnosis Coding Follow-up Appointments Return Appointment in 1 week. Bathing/ Shower/ Hygiene May shower; gently cleanse wound with antibacterial soap, rinse and pat dry prior to dressing wounds Off-Loading Roho cushion for wheelchair Low air-loss mattress (Group 2) Turn and reposition every 2 hours TAJAI, IHDE ([id number]) [id number] Page 7 of 16 Wound Treatment Wound #10 - Sacrum Wound Laterality: Midline Cleanser: Dakin 16 (oz) 0.25 1 x Per Day/30 Days Discharge Instructions: Use as directed. Prim Dressing: Gauze (Generic) 1 x Per Day/30 Days ary Discharge Instructions: As directed: moistened with Dakins Solution Secondary Dressing: Zetuvit Plus 4x4 (in/in) (Generic) 1 x Per Day/30 Days Secured With: Medipore T - 20M Medipore H Soft Cloth Surgical T ape ape, 2x2 (in/yd) (Generic) 1 x Per Day/30 Days Wound #14 - Upper Leg Wound Laterality: Left, Posterior Topical: Mupirocin Ointment 1 x Per Day/30 Days Discharge Instructions: Apply as directed by provider. Prim Dressing: Hydrofera Blue Ready Transfer  Foam, 2.5x2.5 (in/in) (Generic) 1 x Per Day/30 Days ary Discharge Instructions: Apply Hydrofera Blue Ready to wound bed as directed Secured With: Tegaderm Film Transparent 6x8 (in/in) (Generic) 1 x Per Day/30 Days Discharge Instructions: Apply to wound bed Electronic Signature(s) Signed: 01/04/2023 4:29:16 PM By: Geralyn Corwin DO Entered By: Geralyn Corwin on 01/04/2023 16:02:13 -------------------------------------------------------------------------------- Problem List Details Patient Name: Date of Service: Bradley [name]. 01/04/2023 2:45 PM Medical Record Number: [id number] Patient Account Number: [id number] Date of Birth/Sex: Treating RN: [birth year] (77 y.o. [name] Primary Care Provider: Marisue Ivan Other Clinician: Betha Loa Referring Provider: Treating Provider/Extender: Greta Doom in Treatment: [phone] Active Problems ICD-10 Encounter Code Description Active Date MDM Diagnosis L89.154 Pressure ulcer of sacral region, stage 4  07/16/2019 No Yes L89.892 Pressure ulcer of other site, stage 2 11/16/2022 No Yes S81.802A Unspecified open wound, left lower leg, initial encounter 11/16/2022 No Yes M86.68 Other chronic osteomyelitis, other site 07/16/2019 No Yes G82.20 Paraplegia, unspecified 07/16/2019 No Yes Bradley [name], Bradley [name] ([id number]) [phone].pdf Page 8 of 16 Inactive Problems ICD-10 Code Description Active Date Inactive Date 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 01/04/2023 2:45 PM Medical Record Number: [id number] Patient Account Number: [id number] Date of Birth/Sex: Treating RN: 1946/02/24 (77 y.o. [name] Primary Care Provider: Marisue Ivan Other Clinician: Betha Loa Referring Provider: Treating Provider/Extender: Greta Doom in Treatment: 181 Subjective Chief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 significant improvement overall based on what I am seeing today. Fortunately there is no signs of significant systemic infection. No fevers, chills, nausea, vomiting, or diarrhea. With that being said there is odor to the drainage from the wound and subsequently also what appears to be increased drainage based on what we are seeing today as well as what his home health nurse called [place] about that she was concerned with as well. 08/13/2019 upon evaluation today patient appears to be doing about the same at this point with regard to his wound although I think the dressing may be [name] little less drainage wise compared to what it was previous. Fortunately there is no signs of active infection at this time. No fevers, chills, nausea, vomiting, or diarrhea. He has been taking the antibiotic for only 3 days. 08/23/2019 upon evaluation today patient appears to be doing not nearly as well with regard to his wound. In general really not making [name] lot of progress which is unfortunate. There does not appear to be any signs of active infection at this time. No fevers, chills, nausea, vomiting, or diarrhea. With that being said the patient unfortunately does seem to be experiencing continued epiboly around the edges of the wound as well as significant scar tissue he has been in the majority of his day sitting in his chair which is likely [name] big reason for all of this. Fortunately there is no signs of active infection at this time. No fevers, chills, Bradley [name], Bradley [name] ([id number]) [id number] Page 11 of 16 nausea, vomiting, or diarrhea. 08/29/2019 upon evaluation today patient's wound actually appears to be showing some signs of improvement.  The region of few areas of new skin growth around the edges of the wound even where he has some of the epiboly. This is actually good news and I think that he has been very aggressive and offloading over the past week since we showed him the pictures of his wound last week. There still may be [name] chance that he is going require some sharp debridement to clear away some of these edges but to be honest I think that is can be quite an undertaking. The main reason is that he has [name] lot of thickened scar tissue and it is good to bleed quite significantly in my opinion. The I think if organ to do that we may even need to have [name] portable or disposable cautery in order to make sure that we are able to get the area completely sealed up as far as bleeding is concerned. I do have one that we can utilize. However being that the wound looks so much better right now would like to give this 2 weeks to see how things stand and look at that point before making any additional recommendations. 09/12/2019 upon evaluation today patient appears to be making some progress as far as offloading is concerned. He still has [name] substantial wound but nonetheless he tells me he is off loading much more aggressively at this point. This is obviously good news. No fevers, chills, nausea, vomiting, or diarrhea. 4/22; I have not seen this patient in quite some time. No major change in the condition of the wound or its wound volume. Surrounding maceration of the skin around the edges of the wound. The wound is fairly substantial. T close to the anal opening to consider [name] wound VAC. He had extensive trials of plastic o surgery at [facility] which really does not result in any improvement. His wound bed is however clean 10/10/2019 upon evaluation today patient appears to be doing well with regard to the wound in the sacral region. He has been tolerating the dressing changes without complication. Fortunately there is no signs of infection and  he actually has some epithelial growth noted as well. He tells me has been trying to continue to offload this area which is excellent that is what he needs to do most as far as what he can have in his control. Otherwise I feel like the collagen with [name] saline moistened gauze behind has been beneficial for him. 5/20; collagen and normal saline wet-to-dry. Although the tissue when this large sacral wound looks reasonable there is been absolutely no benefit to the overall closure. He has macerated skin around this. He tells me that he spends [name] large part of the day up in the wheelchair. He still has home health 11/07/2019 upon evaluation today patient appears to be doing well with regard to his wounds at this point. Fortunately there is no signs of active infection at this time. Overall I feel like his sacral wound in general appears to be doing quite nicely. I am very pleased with overall how things are progressing and I feel like the patient is making excellent progress towards resolution. With that being said he still has [name] long ways to go before we expect to see this completely healed. 11/21/2019 upon evaluation today patient appears to be doing about the same in regard to his sacral wound. Unfortunately he is really not making much in the way of improvement when I questioned him about how much time he is really spending sitting on his bottom he tells me that the majority of his day is mainly in that position though he does spend some time in bed. It does not sound like it is enough however neither does it look like it is enough. 12/24/2019 upon evaluation today patient appears to be doing really about the same there is no significant improvement overall in his wound bed. Last time he was seen here we did have [name] discussion about the possibility of seeing if I can get [name] surgeon to evaluate and potentially clear away some of the thickened edges of the wound in order to allow this wound to potentially have [name]  chance of closing with [name] wound VAC. Right now we could not even get [name] wound VAC to seal with some of the rolled and thickened edges. With that being said elected to the patient to think about this to discuss today. At this point the patient does want to see if I could make the referral to potentially see if the surgeon would be able to perform this and subsequently he agrees to go to [name] skilled nursing facility following if they are able to do this in order to allow for [name] wound VAC and to get this wound to heal more effectively and quickly. In my opinion the only way that I would recommend that he see the surgeon to clear away the edges would be if he is also in agreement with going to skilled nursing facility as I know he cannot appropriately offload at home to take care of this and that setting. 01/07/2020 on evaluation today patient appears to be doing about the same in regard to his wound. There is no signs of active infection at this time which is great news. All in all he is really  doing about the same. We have made referral to surgery to see if they can potentially perform debridement to clear away some of the scarred edging of the wound so we could potentially see about [name] wound VAC to try to help this heal more efficiently. Again we have discussed that if this occurs he would be in [name] skilled nursing facility following or release would need to be in order to appropriately offload. Nonetheless we are still in the process of working that referral. 01/21/2020 upon evaluation today patient appears to be doing about the same in regard to his sacral wound. He did see Dr. Tonna Boehringer Dr. Geoffery Lyons feeling was that there was not anything surgically he could offer at this point as he did not feel like that [name] wound VAC would likely be appropriate for the patient. With that being said he did recommend potential for trying to get the patient into [name] skilled nursing facility as an outpatient and they will get me working  on that as well. Fortunately there is no signs of active infection at this time. 11/10; this is [name] patient that has not been seen here in quite [name] bit of time while we were in our alternative venue. As well I have not seen this patient in probably over 2 years. As I remember things with him he has had this pressure ulcer encompassing his lower sacrum and coccyx and the surrounding buttocks for [name] prolonged period of time. When he first came here the surface looked fairly good and the wound was smaller we attempted [name] wound VAC but we could never keep this in place. Because of nonresponse of the wound to topical dressings we referred him to [facility] plastic surgery where preparation was being made for flap closure. Intact he had [name] colostomy however after that he was deemed to have 2 large wound for flap closure. He did have osteomyelitis in the coccyx area in 2019. He is using wet-to-dry he has home health. They are managing his ostomy supplies as well. The wound is substantially larger than what I remember. Versus last time he was here to wound months or so ago he has an unstageable wound on the left upper thigh posteriorly. He tells me he is in his chair quite [name] bit especially recently 12/1; patient comes back to clinic. He did not get the lab work done apparently home health would not do this they referred him to the lab where he is apparently having [name] CBC checked because he is on iron infusions. We will try to get the lab work I ordered done there. He also did not get the x-ray he says he does not remember the discussion. The issue is that this large wound has probing areas to bone. He may have underlying osteomyelitis again he was treated for this previously I think in 2019 12/15; the patient did have lab work done. Notable for iron deficiency anemia with [name] hemoglobin of 9.7 although he is following with hematology for this his CRP was 7.1 which is up from his previous value. sedimentation  rate of 91 which is also higher. However he maintains [name] relatively high sedimentation rate. Most disturbingly was an albumin of 2.8 although this is also somewhat higher than the 2.2 we had previously. With some difficulty I think [name] home health nurse actually made him some Dakin's solution he is using [name] Dakin's wet-to-dry to the large sacral wound. He has the area on the left upper thigh as well  06/17/2020; I follow this patient on [name] monthly basis. 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 At one point he had underlying osteomyelitis here and he received antibiotic therapy. I believe this was done through [facility] We have been using palliative Dakin's solution wet-to-dry on this which is really This very large wound about the same with healthy surface. No active infection that is obvious. He has developed an area on the left posterior thigh and now [name] mirror image area on the right posterior thigh noted pressure injuries from the wheelchair. This occurs even though he has [name] Roho cushion which is apparently new I have been forwarded lab work from 05/15/2020. At that point his hemoglobin was 9.2 down from 13.8 on 10/5 this is microcytic hypochromic. He has known iron deficiency although the exact cause of this is never really been clear to me. He follows with hematology oncology and has been receiving IV iron he is supposed to follow-up in mid January and I told the patient this. From wound care point of view his albumin is 2.9 sedimentation rate is 74. His sedimentation rate has always been elevated in this range however his C- reactive protein was 82 which is way above anything previously measured. I see that on 05/08/2020 this was 7.1 I am not sure why the rapid discrepancy. He is supposed to have an x-ray of his lower sacrum looking for evidence of osteomyelitis that he was previously treated for. Is possible he would also require more advanced imaging such as another C CT or  MRI He comes in also with paperwork for [name] Clinitron air-fluidized bed with [name] trapeze bar. I asked him if he would be able to transfer in and out of this bed . I am not opposed to ordering it, but I do not want to make his transfers more difficult than they already are. He has [name] Nurse, adult Bradley [name], Bradley [name] ([id number]) [id number] Page 12 of 16 07/15/2020; he is managed to get his Clinitron air-fluidized bed. He has [name] new electric wheelchair with [name] wheelchair cushion. The x-ray I did showed some sacral bone destruction although this did not seem progressive him last time. He was treated for osteomyelitis in this area previously in 2018. His sedimentation rate is 91 CRP 7.1 albumin 2.8. We are using Dakin's wet-to-dry. He has [facility] home health 4/6; 64-month follow-up. He has [name] Clinitron air-fluidized bed. He has the wheelchair cushion. His major wound on the coccyx looks somewhat better including including both of [place] my nurse and my feeling that it is closed in somewhat. Wound is still too close to his anal opening to consider [name] wound VAC. At 1 point we were trying to get him into [facility] plastics to get [name] flap closure but for 1 reason or another this never moved forward I was never really certain what the issue was. He has an area on the left upper thigh in the gluteal fold. This is [name] clean wound. 6/1; 77-month follow-up. I follow this man on [name] palliative basis he has [name] chronic stage IV wound over the lower sacrum coccyx and surrounding buttocks. He has an air-fluidized bed and [name] wheelchair cushion but he spends more time in the wheelchair than he does off this area by his own admission. I do not think the area is really changed that much although it is probably deeper in 1 section. He does tell us that he was discharged from home health because of lack  of change of the wound. This was [employer]. He was treated for underlying osteomyelitis several years ago and he may have  underlying chronic osteomyelitis. I do not believe he ever got the x-ray I ordered 8/3; patient presents for his 70-month follow-up. He reports no issues or complaints today. He continues to use Dakin's wet-to-dry dressings to his sacrum and silver alginate to his left lateral thigh wound. He denies signs of infection. 10/5; patient presents for 70-month follow-up. He has no issues or complaints today. He continues to use Dakin's wet-to-dry dressings to his sacrum. He states that the left lateral thigh wound is healed. He denies signs of infection. 12/7; patient presents for 38-month follow-up. He states he would like to obtain [name] new air loss mattress bed. He states his current one is having technical difficulties. He currently is using Dakin's wet-to-dry dressings. He denies signs of infection. 2/1; patient presents for follow-up. He has no issues or complaints today. He currently uses Dakin's wet-to-dry dressings. He denies signs of infection. He has his new air loss mattress. 4/5; patient presents for follow-up. He has been using Dakin's wet-to-dry dressings. He has no issues or complaints today. He denies signs of infection. 11-10-2021 upon evaluation today patient's wound is doing better since I last saw him although it has been quite sometime since then to be perfectly honest. In fact I think it may have been even back in 2022 if I am not mistaken. Either way he has been seeing Dr. Mikey Bussing roughly every 2 months per his request. His wound still though smaller does have [name] significant amount of thick scar tissue surrounding the edge which is making it very difficult to heal to be honest. His son is performing the dressing changes at home currently. 8/23; patient presents for follow-up. He has no issues or complaints today. He has been doing Dakin's wet-to-dry dressings. He is not interested in any further surgical intervention for his sacral wound. He denies signs of infection. 11/15; Patient  presents for follow up. He has been using Dakins wet to dry dressings. He has no issues or complaints. 2/7; patient presents for his regular 11-month follow-up. He continues to use Dakin's wet-to-dry dressings. He has no issues or complaints today. 4/10; patient presents for follow-up. He has been using Dakin's wet-to-dry dressings. He has no issues or complaints. He denies signs of infection. 6/12; patient presents for follow-up. He has been using Dakin's wet-to-dry dressings to the sacral wound. He has [name] small area of skin breakdown to the left posterior leg and has been keeping this covered. He has no issues or complaints today. He denies signs of infection. 6/26; patient presents for follow-up. He has been using Dakin's wet-to-dry dressings to the sacral wound he now has another area of skin breakdown just above the previous left posterior leg wound. Appears to be from friction. He has been using Hydrofera Blue to these wounds. Insurance is asking patient to be seen weekly in order for him to keep his air mattress. After 5 weeks he can go out 2 months per patient Report. 7/3; patient presents for follow-up. He has been using Dakin's wet-to-dry dressings to the sacral wound and Hydrofera Blue to the left posterior leg wound. 7/10; patient presents for follow-up. He has been using Dakin's wet-to-dry dressings to the sacral wound and Hydrofera Blue with Medihoney to the left posterior leg wound. Wounds are stable. 7/17; patient presents for follow-up. He has been using Dakin's wet-to-dry dressings to the sacral wound and  Hydrofera Blue with antibiotic ointment to the left posterior leg wound. He has been sitting up more in his chair this past week. He has no issues or complaints today. 7/24; patient presents for follow-up. He has been using Dakin's wet-to-dry dressings to the sacral wound and Hydrofera Blue with antibiotic ointment to the left posterior leg wound. He has no issues or complaints. 7/31;  patient presents for follow-up. He has been using Dakin's wet-to-dry dressings to the sacral wound and Hydrofera Blue with antibiotic ointment to the left posterior leg wound. Left leg wound has more healthy granulation tissue present today. He has no issues or complaints. He would like [name] different air mattress. Objective Constitutional Vitals Time Taken: 2:52 PM, Temperature: 98.0 F, Pulse: 70 bpm, Respiratory Rate: 18 breaths/min, Blood Pressure: 136/66 mmHg. General Notes: Sacral ulcer: Granulation tissue throughout. Not all areas of the wound bed appears healthy. No probing to bone. No obvious signs of infection. Open wounds to the posterior left leg with granulation tissue and scant nonviable tissue. No signs of infection. Integumentary (Hair, Skin) Wound #10 status is Open. Original cause of wound was Pressure Injury. The date acquired was: 06/07/2015. The wound has been in treatment 181 weeks. The wound is located on the Midline Sacrum. The wound measures 8.4cm length x 2cm width x 2.1cm depth; 13.195cm^2 area and 27.709cm^3 volume. There is muscle and Fat Layer (Subcutaneous Tissue) exposed. There is [name] medium amount of serosanguineous drainage noted. The wound margin is epibole. There is large (67-100%) red, pink granulation within the wound bed. There is [name] small (1-33%) amount of necrotic tissue within the wound bed including Adherent Slough. Wound #14 status is Open. Original cause of wound was Shear/Friction. The date acquired was: 11/05/2022. The wound has been in treatment 7 weeks. The wound is located on the Left,Posterior Upper Leg. The wound measures 5cm length x 2.5cm width x 0.4cm depth; 9.817cm^2 area and 3.927cm^3 volume. Bradley [name], Bradley [name] ([id number]) [id number] Page 13 of 16 There is Fat Layer (Subcutaneous Tissue) exposed. There is [name] medium amount of serous drainage noted. The wound margin is thickened. There is medium (34- 66%) pink granulation within the  wound bed. There is [name] small (1-33%) amount of necrotic tissue within the wound bed including Adherent Slough. Assessment Active Problems ICD-10 Pressure ulcer of sacral region, stage 4 Pressure ulcer of other site, stage 2 Unspecified open wound, left lower leg, initial encounter Other chronic osteomyelitis, other site Paraplegia, unspecified Patient's sacral wound is stable. The left posterior leg wound has more healthy granulation tissue present. I recommended aggressively offloading this area and being careful with transfers. Continue antibiotic ointment and Hydrofera Blue. Follow-up in 1 week. He would like [name] new air mattress as he states he feels like the current one is not supportive enough. We will look into [name] new one. Plan Follow-up Appointments: Return Appointment in 1 week. Bathing/ Shower/ Hygiene: May shower; gently cleanse wound with antibacterial soap, rinse and pat dry prior to dressing wounds Off-Loading: Roho cushion for wheelchair Low air-loss mattress (Group 2) Turn and reposition every 2 hours WOUND #10: - Sacrum Wound Laterality: Midline Cleanser: Dakin 16 (oz) 0.25 1 x Per Day/30 Days Discharge Instructions: Use as directed. Prim Dressing: Gauze (Generic) 1 x Per Day/30 Days ary Discharge Instructions: As directed: moistened with Dakins Solution Secondary Dressing: Zetuvit Plus 4x4 (in/in) (Generic) 1 x Per Day/30 Days Secured With: Medipore T - 44M Medipore H Soft Cloth Surgical T ape ape, 2x2 (in/yd) (Generic) 1 x  Per Day/30 Days WOUND #14: - Upper Leg Wound Laterality: Left, Posterior Topical: Mupirocin Ointment 1 x Per Day/30 Days Discharge Instructions: Apply as directed by provider. Prim Dressing: Hydrofera Blue Ready Transfer Foam, 2.5x2.5 (in/in) (Generic) 1 x Per Day/30 Days ary Discharge Instructions: Apply Hydrofera Blue Ready to wound bed as directed Secured With: T egaderm Film Transparent 6x8 (in/in) (Generic) 1 x Per Day/30 Days Discharge  Instructions: Apply to wound bed 1. Hydrofera Blue and antibiotic ointment 2. Aggressive offloading 3. Dakin's wet-to-dry dressings 4. Follow-up in 1 week Electronic Signature(s) Signed: 01/04/2023 4:29:16 PM By: Geralyn Corwin DO Entered By: Geralyn Corwin on 01/04/2023 16:00:30 -------------------------------------------------------------------------------- ROS/PFSH Details Patient Name: Date of Service: Bradley [name]. 01/04/2023 2:45 PM Medical Record Number: [id number] Patient Account Number: [id number] Date of Birth/Sex: Treating RN: 06-Jan-1946 (77 y.o. Raevon, Broom, Frannie [name] ([id number]) [id number] Page 14 of 16 Primary Care Provider: Marisue Ivan Other Clinician: Betha Loa Referring Provider: Treating Provider/Extender: Greta Doom in Treatment: 181 Label Progress Note Print Version as History and Physical for this encounter Information Obtained From Patient Eyes Medical History: Negative for: Cataracts; Glaucoma; Optic Neuritis Hematologic/Lymphatic Medical History: Positive for: Anemia Negative for: Hemophilia; Human Immunodeficiency Virus; Lymphedema; Sickle Cell Disease Respiratory Medical History: Negative for: Aspiration; Asthma; Chronic Obstructive Pulmonary Disease (COPD); Pneumothorax; Sleep Apnea; Tuberculosis Cardiovascular Medical History: Positive for: Hypertension Negative for: Angina; Arrhythmia; Congestive Heart Failure; Coronary Artery Disease; Deep Vein Thrombosis; Hypotension; Myocardial Infarction; Peripheral Arterial Disease; Peripheral Venous Disease; Phlebitis; Vasculitis Gastrointestinal Medical History: Negative for: Cirrhosis ; Colitis; Crohns; Hepatitis [name]; Hepatitis B; Hepatitis C Past Medical History Notes: colostomy Endocrine Medical History: Negative for: Type I Diabetes; Type II Diabetes Genitourinary Medical History: Negative for: End Stage Renal  Disease Past Medical History Notes: frequent urination Immunological Medical History: Negative for: Lupus Erythematosus; Raynauds; Scleroderma Integumentary (Skin) Medical History: Positive for: History of pressure wounds Musculoskeletal Medical History: Positive for: Rheumatoid Arthritis Negative for: Gout; Osteoarthritis; Osteomyelitis Neurologic Medical History: Positive for: Paraplegia Negative for: Dementia; Neuropathy; Quadriplegia; Seizure Disorder Oncologic Medical History: Negative for: Received Chemotherapy; Received Radiation Psychiatric Medical History: EDREES, VALENT ([id number]) [id number] Page 15 of 16 Negative for: Anorexia/bulimia; Confinement Anxiety Immunizations Pneumococcal Vaccine: Received Pneumococcal Vaccination: No Immunization Notes: up to date Implantable Devices None Hospitalization / Surgery History Type of Hospitalization/Surgery December 2020 [facility] Family and Social History Cancer: Yes - Mother; Diabetes: No; Heart Disease: Yes - Mother,Father; Hereditary Spherocytosis: No; Hypertension: No; Kidney Disease: No; Lung Disease: No; Seizures: Yes - Child; Stroke: No; Thyroid Problems: No; Tuberculosis: No; Former smoker; Marital Status - Single; Alcohol Use: Never; Drug Use: No History; Caffeine Use: Moderate - coffee; Financial Concerns: No; Food, Clothing or Shelter Needs: No; Support System Lacking: No; Transportation Concerns: No Electronic Signature(s) Signed: 01/04/2023 4:29:16 PM By: Geralyn Corwin DO Signed: 01/06/2023 11:12:15 AM By: Elliot Gurney, BSN, RN, CWS, Kim RN, BSN Entered By: Geralyn Corwin on 01/04/2023 16:02:23 -------------------------------------------------------------------------------- SuperBill Details Patient Name: Date of Service: Bradley [name]. 01/04/2023 Medical Record Number: [id number] Patient Account Number: [id number] Date of Birth/Sex: Treating RN: 1945/11/02 (77 y.o. [name] Primary Care Provider: Marisue Ivan Other Clinician: Betha Loa Referring Provider: Treating Provider/Extender: Greta Doom in Treatment: 181 Diagnosis Coding ICD-10 Codes Code Description L89.154 Pressure ulcer of sacral region, stage 4 L89.893 Pressure ulcer of other site, stage 3 S81.802A Unspecified open wound, left lower leg, initial encounter M86.68 Other chronic osteomyelitis, other site G82.20 Paraplegia, unspecified Facility  Procedures : CPT4 Code: 16109604 Description: 99214 - WOUND CARE VISIT-LEV 4 EST PT Modifier: Quantity: 1 Physician Procedures : CPT4 Code Description Modifier [id number] 99213 - WC PHYS LEVEL 3 - EST PT ICD-10 Diagnosis Description L89.154 Pressure ulcer of sacral region, stage 4 L89.893 Pressure ulcer of other site, stage 3 S81.802A Unspecified open wound, left lower leg, initial  encounter ANTON, CHERAMIE ([id number]) [id number] Pag [phone] Other chronic osteomyelitis, other site Quantity: 1 e 16 of 16 Electronic Signature(s) Signed: 01/04/2023 4:29:16 PM By: Geralyn Corwin DO Entered By: Geralyn Corwin on 01/04/2023 16:00:47

## 2023-01-06 NOTE — Progress Notes (Signed)
Bradley [name], Bradley [name] ([id number]) [id number] Page 1 of 10 Visit Report for 01/04/2023 Arrival Information Details Patient Name: Date of Service: Bradley Pink [name] 01/04/2023 2:45 PM Medical Record Number: [id number] Patient Account Number: [id number] Date of Birth/Sex: Treating RN: Feb 09, 1946 (77 y.o. [name] Primary Care : Marisue Ivan Other Clinician: Betha Loa Referring : Treating /Extender: Greta Doom in Treatment: 181 Visit Information History Since Last Visit All ordered tests and consults were completed: No Patient Arrived: Wheel Chair Added or deleted any medications: No Arrival Time: 14:50 Any new allergies or adverse reactions: No Transfer Assistance: Michiel Sites Lift Had [name] fall or experienced change in No Patient Identification Verified: Yes activities of daily living that may affect Secondary Verification Process Completed: Yes risk of falls: Patient Requires Transmission-Based Precautions: No Signs or symptoms of abuse/neglect since last visito No Patient Has Alerts: No Hospitalized since last visit: No Implantable device outside of the clinic excluding No cellular tissue based products placed in the center since last visit: Has Dressing in Place as Prescribed: Yes Pain Present Now: No Electronic Signature(s) Signed: 01/04/2023 4:54:16 PM By: Betha Loa Entered By: Betha Loa on 01/04/2023 14:51:18 -------------------------------------------------------------------------------- Clinic Level of Care Assessment Details Patient Name: Date of Service: Bradley Pink [name] 01/04/2023 2:45 PM Medical Record Number: [id number] Patient Account Number: [id number] Date of Birth/Sex: Treating RN: 06/16/1945 (77 y.o. [name] Primary Care : Marisue Ivan Other Clinician: Betha Loa Referring : Treating /Extender: Greta Doom in Treatment: 181 Clinic Level of Care Assessment Items TOOL 4 Quantity Score []  - 0 Use when only an EandM is performed on FOLLOW-UP visit ASSESSMENTS - Nursing Assessment / Reassessment X- 1 10 Reassessment of Co-morbidities (includes updates in patient status) X- 1 5 Reassessment of Adherence to Treatment Plan MANSOOR, HILLYARD ([id number]) [phone].pdf Page 2 of 10 ASSESSMENTS - Wound and Skin [name] ssessment / Reassessment []  - 0 Simple Wound Assessment / Reassessment - one wound X- 2 5 Complex Wound Assessment / Reassessment - multiple wounds []  - 0 Dermatologic / Skin Assessment (not related to wound area) ASSESSMENTS - Focused Assessment []  - 0 Circumferential Edema Measurements - multi extremities []  - 0 Nutritional Assessment / Counseling / Intervention []  - 0 Lower Extremity Assessment (monofilament, tuning fork, pulses) []  - 0 Peripheral Arterial Disease Assessment (using hand held doppler) ASSESSMENTS - Ostomy and/or Continence Assessment and Care []  - 0 Incontinence Assessment and Management []  - 0 Ostomy Care Assessment and Management (repouching, etc.) PROCESS - Coordination of Care X - Simple Patient / Family Education for ongoing care 1 15 []  - 0 Complex (extensive) Patient / Family Education for ongoing care []  - 0 Staff obtains Chiropractor, Records, T Results / Process Orders est []  - 0 Staff telephones HHA, Nursing Homes / Clarify orders / etc []  - 0 Routine Transfer to another Facility (non-emergent condition) []  - 0 Routine Hospital Admission (non-emergent condition) []  - 0 New Admissions / Manufacturing engineer / Ordering NPWT Apligraf, etc. , []  - 0 Emergency Hospital Admission (emergent condition) X- 1 10 Simple Discharge Coordination []  - 0 Complex (extensive) Discharge Coordination PROCESS - Special Needs []  - 0 Pediatric / Minor Patient Management []  - 0 Isolation Patient Management []  - 0 Hearing  / Language / Visual special needs []  - 0 Assessment of Community assistance (transportation, D/C planning, etc.) []  - 0 Additional assistance / Altered mentation []  - 0 Support Surface(s) Assessment (bed, cushion, seat, etc.) INTERVENTIONS -  Wound Cleansing / Measurement []  - 0 Simple Wound Cleansing - one wound X- 2 5 Complex Wound Cleansing - multiple wounds X- 1 5 Wound Imaging (photographs - any number of wounds) []  - 0 Wound Tracing (instead of photographs) []  - 0 Simple Wound Measurement - one wound X- 2 5 Complex Wound Measurement - multiple wounds INTERVENTIONS - Wound Dressings []  - 0 Small Wound Dressing one or multiple wounds X- 2 15 Medium Wound Dressing one or multiple wounds []  - 0 Large Wound Dressing one or multiple wounds []  - 0 Application of Medications - topical []  - 0 Application of Medications - injection INTERVENTIONS - Miscellaneous []  - 0 External ear exam Bradley [name], Bradley [name] ([id number]) [id number] Page 3 of 10 []  - 0 Specimen Collection (cultures, biopsies, blood, body fluids, etc.) []  - 0 Specimen(s) / Culture(s) sent or taken to Lab for analysis X- 1 10 Patient Transfer (multiple staff / Michiel Sites Lift / Similar devices) []  - 0 Simple Staple / Suture removal (25 or less) []  - 0 Complex Staple / Suture removal (26 or more) []  - 0 Hypo / Hyperglycemic Management (close monitor of Blood Glucose) []  - 0 Ankle / Brachial Index (ABI) - do not check if billed separately X- 1 5 Vital Signs Has the patient been seen at the hospital within the last three years: Yes Total Score: 120 Level Of Care: New/Established - Level 4 Electronic Signature(s) Signed: 01/04/2023 4:54:16 PM By: Betha Loa Entered By: Betha Loa on 01/04/2023 15:26:37 -------------------------------------------------------------------------------- Encounter Discharge Information Details Patient Name: Date of Service: Bradley Pink [name] 01/04/2023  2:45 PM Medical Record Number: [id number] Patient Account Number: [id number] Date of Birth/Sex: Treating RN: [birth year] (77 y.o. Bradley [name], Bradley [name] Primary Care : Marisue Ivan Other Clinician: Betha Loa Referring : Treating /Extender: Greta Doom in Treatment: [phone] Encounter Discharge Information Items Discharge Condition: Stable Ambulatory Status: Wheelchair Discharge Destination: Home Transportation: Other Accompanied By: self Schedule Follow-up Appointment: Yes Clinical Summary of Care: Electronic Signature(s) Signed: 01/04/2023 4:54:16 PM By: Betha Loa Entered By: Betha Loa on 01/04/2023 16:22:22 Lower Extremity Assessment Details -------------------------------------------------------------------------------- Bradley [name] ([id number]) [id number] Page 4 of 10 Patient Name: Date of Service: Bradley Pink [name] 01/04/2023 2:45 PM Medical Record Number: [id number] Patient Account Number: [id number] Date of Birth/Sex: Treating RN: 11-04-1945 (77 y.o. [name] Primary Care : Marisue Ivan Other Clinician: Betha Loa Referring : Treating /Extender: Greta Doom in Treatment: 181 Electronic Signature(s) Signed: 01/04/2023 4:54:16 PM By: Betha Loa Signed: 01/06/2023 11:12:15 AM By: Elliot Gurney, BSN, RN, CWS, Kim RN, BSN Entered By: Betha Loa on 01/04/2023 15:12:26 -------------------------------------------------------------------------------- Multi Wound Chart Details Patient Name: Date of Service: Bradley Pink [name] 01/04/2023 2:45 PM Medical Record Number: [id number] Patient Account Number: [id number] Date of Birth/Sex: Treating RN: 12-Jul-1945 (77 y.o. Bradley [name], Kim Primary Care : Marisue Ivan Other Clinician: Betha Loa Referring : Treating /Extender: Greta Doom in Treatment: 181 Vital Signs Height(in): Pulse(bpm): 70 Weight(lbs): Blood Pressure(mmHg): 136/66 Body Mass Index(BMI): Temperature(F): 98.0 Respiratory Rate(breaths/min): 18 [10:Photos:] [[name]:[name]] Midline Sacrum Left, Posterior Upper Leg [name] Wound Location: Pressure Injury Shear/Friction [name] Wounding Event: Pressure Ulcer Pressure Ulcer [name] Primary Etiology: Anemia, Hypertension, History of Anemia, Hypertension, History of [name] Comorbid History: pressure wounds, Rheumatoid Arthritis, pressure wounds, Rheumatoid Arthritis, Paraplegia Paraplegia 06/07/2015 11/05/2022 [name] Date Acquired: 181 7 [name] Weeks of Treatment: Open Open [name] Wound Status: No No [name] Wound Recurrence: No Yes [name] Clustered  Wound: [name] 2 [name] Clustered Quantity: 8.4x2x2.1 5x2.5x0.4 [name] Measurements L x W x D (cm) 13.195 9.817 [name] [name] (cm) : rea 27.709 3.927 [name] Volume (cm) : 74.40% -362.80% [name] % Reduction in [name] rea: 91.00% -18600.00% [name] % Reduction in Volume: Category/Stage IV Category/Stage III [name] Classification: Medium Medium [name] Exudate [name] mount: Serosanguineous Serous [name] Exudate Type: red, brown amber [name] Exudate Color: Epibole Thickened [name] Wound Margin: Large (67-100%) Medium (34-66%) [name] Granulation [name] mount: Red, Pink Pink [name] Granulation QualityCARREL, Bradley [name] ([id number]) [id number] Page 5 of 10 Small (1-33%) Small (1-33%) [name] Necrotic Amount: Fat Layer (Subcutaneous Tissue): Yes Fat Layer (Subcutaneous Tissue): Yes [name] Exposed Structures: Muscle: Yes Fascia: No Fascia: No Tendon: No Tendon: No Muscle: No Joint: No Joint: No Bone: No Bone: No None [name] [name] Epithelialization: Treatment Notes Electronic Signature(s) Signed: 01/04/2023 4:54:16 PM By: Betha Loa Entered By: Betha Loa on 01/04/2023 15:12:31 -------------------------------------------------------------------------------- Multi-Disciplinary Care Plan  Details Patient Name: Date of Service: Bradley Pink [name] 01/04/2023 2:45 PM Medical Record Number: [id number] Patient Account Number: [id number] Date of Birth/Sex: Treating RN: 09-04-1945 (77 y.o. [name] Primary Care : Marisue Ivan Other Clinician: Betha Loa Referring : Treating /Extender: Greta Doom in Treatment: [id number] Active Inactive Electronic Signature(s) Signed: 01/04/2023 4:54:16 PM By: Betha Loa Signed: 01/06/2023 11:12:15 AM By: Elliot Gurney, BSN, RN, CWS, Kim RN, BSN Entered By: Betha Loa on 01/04/2023 15:28:50 -------------------------------------------------------------------------------- Pain Assessment Details Patient Name: Date of Service: Bradley Pink [name] 01/04/2023 2:45 PM Medical Record Number: [id number] Patient Account Number: [id number] Date of Birth/Sex: Treating RN: 1945-08-28 (77 y.o. [name] Primary Care : Marisue Ivan Other Clinician: Betha Loa Referring : Treating /Extender: Greta Doom in Treatment: [id number] Active Problems Location of Pain Severity and Description of Pain Patient Has Paino No Site Locations Bradley [name], Bradley [name] ([id number]) [id number] Page 6 of 10 Pain Management and Medication Current Pain Management: Electronic Signature(s) Signed: 01/04/2023 4:54:16 PM By: Betha Loa Signed: 01/06/2023 11:12:15 AM By: Elliot Gurney, BSN, RN, CWS, Kim RN, BSN Entered By: Betha Loa on 01/04/2023 14:55:22 -------------------------------------------------------------------------------- Patient/Caregiver Education Details Patient Name: Date of Service: Bradley Pink [name] 7/31/2024andnbsp2:45 PM Medical Record Number: [id number] Patient Account Number: [id number] Date of Birth/Gender: Treating RN: Nov 09, 1945 (77 y.o. [name] Primary Care Physician: Marisue Ivan Other Clinician:  Betha Loa Referring Physician: Treating Physician/Extender: Greta Doom in Treatment: [id number] Education Assessment Education Provided To: Patient Education Topics Provided Wound/Skin Impairment: Handouts: Other: continue wound care as directed Methods: Explain/Verbal Responses: State content correctly Electronic Signature(s) Signed: 01/04/2023 4:54:16 PM By: Betha Loa Entered By: Betha Loa on 01/04/2023 16:21:42 Bradley [name] ([id number]) [id number] Page 7 of 10 -------------------------------------------------------------------------------- Wound Assessment Details Patient Name: Date of Service: Bradley Pink [name] 01/04/2023 2:45 PM Medical Record Number: [id number] Patient Account Number: [id number] Date of Birth/Sex: Treating RN: 1945-08-31 (77 y.o. Bradley [name], Bradley [name] Primary Care : Marisue Ivan Other Clinician: Betha Loa Referring : Treating /Extender: Claudie Fisherman Weeks in Treatment: 181 Wound Status Wound Number: 10 Primary Pressure Ulcer Etiology: Wound Location: Midline Sacrum Wound Open Wounding Event: Pressure Injury Status: Date Acquired: 06/07/2015 Comorbid Anemia, Hypertension, History of pressure wounds, Weeks Of Treatment: 181 History: Rheumatoid Arthritis, Paraplegia Clustered Wound: No Photos Wound Measurements Length: (cm) 8 Width: (cm) 2 Depth: (cm) 2 Area: (cm) Volume: (cm) .4 % Reduction in Area: 74.4% % Reduction in Volume: 91% .1 Epithelialization: None 13.195 27.709 Wound  Description Classification: Category/Stage IV Wound Margin: Epibole Exudate Amount: Medium Exudate Type: Serosanguineous Exudate Color: red, brown Foul Odor After Cleansing: No Slough/Fibrino Yes Wound Bed Granulation Amount: Large (67-100%) Exposed Structure Granulation Quality: Red, Pink Fascia Exposed: No Necrotic Amount: Small (1-33%) Fat  Layer (Subcutaneous Tissue) Exposed: Yes Necrotic Quality: Adherent Slough Tendon Exposed: No Muscle Exposed: Yes Necrosis of Muscle: No Joint Exposed: No Bone Exposed: No Treatment Notes Wound #10 (Sacrum) Wound Laterality: Midline Cleanser Dakin 16 (oz) 0.25 Discharge Instruction: Use as directed. Bradley [name], Bradley [name] ([id number]) [id number] Page 8 of 10 Peri-Wound Care Topical Primary Dressing Gauze Discharge Instruction: As directed: moistened with Dakins Solution Secondary Dressing Zetuvit Plus 4x4 (in/in) Secured With Medipore T - 63M Medipore H Soft Cloth Surgical T ape ape, 2x2 (in/yd) Compression Wrap Compression Stockings Add-Ons Electronic Signature(s) Signed: 01/04/2023 4:54:16 PM By: Betha Loa Signed: 01/06/2023 11:12:15 AM By: Elliot Gurney, BSN, RN, CWS, Kim RN, BSN Entered By: Betha Loa on 01/04/2023 15:11:47 -------------------------------------------------------------------------------- Wound Assessment Details Patient Name: Date of Service: Bradley Pink [name] 01/04/2023 2:45 PM Medical Record Number: [id number] Patient Account Number: [id number] Date of Birth/Sex: Treating RN: 28-Sep-1945 (77 y.o. Bradley [name], Bradley [name] Primary Care : Marisue Ivan Other Clinician: Betha Loa Referring : Treating /Extender: Claudie Fisherman Weeks in Treatment: 181 Wound Status Wound Number: 14 Primary Pressure Ulcer Etiology: Wound Location: Left, Posterior Upper Leg Wound Open Wounding Event: Shear/Friction Status: Date Acquired: 11/05/2022 Comorbid Anemia, Hypertension, History of pressure wounds, Weeks Of Treatment: 7 History: Rheumatoid Arthritis, Paraplegia Clustered Wound: Yes Photos Wound Measurements Length: (cm) 5 Width: (cm) 2.5 Depth: (cm) 0.4 Clustered Quantity: 2 Area: (cm) 9.817 Bradley [name], Bradley [name] ([id number]) Volume: (cm) 3.927 % Reduction in Area: -362.8% % Reduction in Volume:  -18600% [id number] Page 9 of 10 Wound Description Classification: Category/Stage III Wound Margin: Thickened Exudate Amount: Medium Exudate Type: Serous Exudate Color: amber Foul Odor After Cleansing: No Slough/Fibrino Yes Wound Bed Granulation Amount: Medium (34-66%) Exposed Structure Granulation Quality: Pink Fascia Exposed: No Necrotic Amount: Small (1-33%) Fat Layer (Subcutaneous Tissue) Exposed: Yes Necrotic Quality: Adherent Slough Tendon Exposed: No Muscle Exposed: No Joint Exposed: No Bone Exposed: No Treatment Notes Wound #14 (Upper Leg) Wound Laterality: Left, Posterior Cleanser Peri-Wound Care Topical Mupirocin Ointment Discharge Instruction: Apply as directed by . Primary Dressing Hydrofera Blue Ready Transfer Foam, 2.5x2.5 (in/in) Discharge Instruction: Apply Hydrofera Blue Ready to wound bed as directed Secondary Dressing Secured With Tegaderm Film Transparent 6x8 (in/in) Discharge Instruction: Apply to wound bed Compression Wrap Compression Stockings Add-Ons Electronic Signature(s) Signed: 01/04/2023 4:54:16 PM By: Betha Loa Signed: 01/06/2023 11:12:15 AM By: Elliot Gurney, BSN, RN, CWS, Kim RN, BSN Entered By: Betha Loa on 01/04/2023 15:12:17 -------------------------------------------------------------------------------- Vitals Details Patient Name: Date of Service: Bradley Pink [name] 01/04/2023 2:45 PM Medical Record Number: [id number] Patient Account Number: [id number] Date of Birth/Sex: Treating RN: 08-02-1945 (77 y.o. [name] Primary Care : Marisue Ivan Other Clinician: Betha Loa Referring : Treating /Extender: Greta Doom in Treatment: [address] LUCAH, Bradley [name] ([id number]) [id number] Page 10 of 10 Time Taken: 14:52 Temperature (F): 98.0 Pulse (bpm): 70 Respiratory Rate (breaths/min): 18 Blood Pressure  (mmHg): 136/66 Reference Range: 80 - 120 mg / dl Electronic Signature(s) Signed: 01/04/2023 4:54:16 PM By: Betha Loa Entered By: Betha Loa on 01/04/2023 14:55:16

## 2023-01-11 DIAGNOSIS — K631 Perforation of intestine (nontraumatic): Secondary | ICD-10-CM | POA: Diagnosis not present

## 2023-01-11 DIAGNOSIS — N319 Neuromuscular dysfunction of bladder, unspecified: Secondary | ICD-10-CM | POA: Diagnosis not present

## 2023-01-11 DIAGNOSIS — Z933 Colostomy status: Secondary | ICD-10-CM | POA: Diagnosis not present

## 2023-01-11 DIAGNOSIS — L89154 Pressure ulcer of sacral region, stage 4: Secondary | ICD-10-CM | POA: Diagnosis not present

## 2023-01-11 DIAGNOSIS — S31000A Unspecified open wound of lower back and pelvis without penetration into retroperitoneum, initial encounter: Secondary | ICD-10-CM | POA: Diagnosis not present

## 2023-01-11 DIAGNOSIS — S81802A Unspecified open wound, left lower leg, initial encounter: Secondary | ICD-10-CM | POA: Diagnosis not present

## 2023-01-11 DIAGNOSIS — L89159 Pressure ulcer of sacral region, unspecified stage: Secondary | ICD-10-CM | POA: Diagnosis not present

## 2023-01-11 DIAGNOSIS — S81809A Unspecified open wound, unspecified lower leg, initial encounter: Secondary | ICD-10-CM | POA: Diagnosis not present

## 2023-01-11 DIAGNOSIS — L24A9 Irritant contact dermatitis due friction or contact with other specified body fluids: Secondary | ICD-10-CM | POA: Diagnosis not present

## 2023-01-18 ENCOUNTER — Ambulatory Visit: Payer: Medicare HMO | Admitting: Internal Medicine

## 2023-01-26 ENCOUNTER — Emergency Department: Payer: Medicare HMO

## 2023-01-26 ENCOUNTER — Inpatient Hospital Stay
Admission: EM | Admit: 2023-01-26 | Discharge: 2023-02-03 | DRG: 853 | Disposition: A | Discharge status: Home Health | Payer: Medicare HMO | Attending: Internal Medicine | Admitting: Internal Medicine

## 2023-01-26 ENCOUNTER — Other Ambulatory Visit: Payer: Self-pay

## 2023-01-26 DIAGNOSIS — M4628 Osteomyelitis of vertebra, sacral and sacrococcygeal region: Secondary | ICD-10-CM | POA: Diagnosis not present

## 2023-01-26 DIAGNOSIS — K435 Parastomal hernia without obstruction or  gangrene: Secondary | ICD-10-CM | POA: Diagnosis present

## 2023-01-26 DIAGNOSIS — I998 Other disorder of circulatory system: Secondary | ICD-10-CM | POA: Diagnosis not present

## 2023-01-26 DIAGNOSIS — G8220 Paraplegia, unspecified: Secondary | ICD-10-CM | POA: Diagnosis not present

## 2023-01-26 DIAGNOSIS — Z515 Encounter for palliative care: Secondary | ICD-10-CM | POA: Diagnosis not present

## 2023-01-26 DIAGNOSIS — Z79899 Other long term (current) drug therapy: Secondary | ICD-10-CM

## 2023-01-26 DIAGNOSIS — M1990 Unspecified osteoarthritis, unspecified site: Secondary | ICD-10-CM | POA: Diagnosis present

## 2023-01-26 DIAGNOSIS — I700 Atherosclerosis of aorta: Secondary | ICD-10-CM | POA: Diagnosis not present

## 2023-01-26 DIAGNOSIS — A419 Sepsis, unspecified organism: Secondary | ICD-10-CM | POA: Diagnosis not present

## 2023-01-26 DIAGNOSIS — N319 Neuromuscular dysfunction of bladder, unspecified: Secondary | ICD-10-CM | POA: Diagnosis present

## 2023-01-26 DIAGNOSIS — Z993 Dependence on wheelchair: Secondary | ICD-10-CM

## 2023-01-26 DIAGNOSIS — E876 Hypokalemia: Secondary | ICD-10-CM | POA: Diagnosis present

## 2023-01-26 DIAGNOSIS — D509 Iron deficiency anemia, unspecified: Secondary | ICD-10-CM | POA: Diagnosis not present

## 2023-01-26 DIAGNOSIS — Z87891 Personal history of nicotine dependence: Secondary | ICD-10-CM | POA: Diagnosis not present

## 2023-01-26 DIAGNOSIS — R509 Fever, unspecified: Secondary | ICD-10-CM | POA: Diagnosis present

## 2023-01-26 DIAGNOSIS — Z1152 Encounter for screening for COVID-19: Secondary | ICD-10-CM | POA: Diagnosis not present

## 2023-01-26 DIAGNOSIS — N400 Enlarged prostate without lower urinary tract symptoms: Secondary | ICD-10-CM | POA: Diagnosis present

## 2023-01-26 DIAGNOSIS — M86152 Other acute osteomyelitis, left femur: Secondary | ICD-10-CM | POA: Diagnosis present

## 2023-01-26 DIAGNOSIS — B964 Proteus (mirabilis) (morganii) as the cause of diseases classified elsewhere: Secondary | ICD-10-CM | POA: Diagnosis present

## 2023-01-26 DIAGNOSIS — L89154 Pressure ulcer of sacral region, stage 4: Secondary | ICD-10-CM | POA: Diagnosis present

## 2023-01-26 DIAGNOSIS — M86172 Other acute osteomyelitis, left ankle and foot: Secondary | ICD-10-CM | POA: Diagnosis not present

## 2023-01-26 DIAGNOSIS — Z933 Colostomy status: Secondary | ICD-10-CM

## 2023-01-26 DIAGNOSIS — R6520 Severe sepsis without septic shock: Secondary | ICD-10-CM | POA: Diagnosis present

## 2023-01-26 DIAGNOSIS — I10 Essential (primary) hypertension: Secondary | ICD-10-CM | POA: Diagnosis not present

## 2023-01-26 DIAGNOSIS — Z87828 Personal history of other (healed) physical injury and trauma: Secondary | ICD-10-CM

## 2023-01-26 DIAGNOSIS — L89159 Pressure ulcer of sacral region, unspecified stage: Secondary | ICD-10-CM | POA: Diagnosis present

## 2023-01-26 DIAGNOSIS — E785 Hyperlipidemia, unspecified: Secondary | ICD-10-CM | POA: Diagnosis present

## 2023-01-26 DIAGNOSIS — M869 Osteomyelitis, unspecified: Principal | ICD-10-CM

## 2023-01-26 DIAGNOSIS — L89329 Pressure ulcer of left buttock, unspecified stage: Secondary | ICD-10-CM | POA: Diagnosis not present

## 2023-01-26 DIAGNOSIS — L89320 Pressure ulcer of left buttock, unstageable: Secondary | ICD-10-CM | POA: Diagnosis not present

## 2023-01-26 DIAGNOSIS — L97929 Non-pressure chronic ulcer of unspecified part of left lower leg with unspecified severity: Secondary | ICD-10-CM | POA: Diagnosis not present

## 2023-01-26 DIAGNOSIS — S31000A Unspecified open wound of lower back and pelvis without penetration into retroperitoneum, initial encounter: Secondary | ICD-10-CM | POA: Diagnosis present

## 2023-01-26 DIAGNOSIS — D508 Other iron deficiency anemias: Secondary | ICD-10-CM | POA: Diagnosis not present

## 2023-01-26 DIAGNOSIS — S31829A Unspecified open wound of left buttock, initial encounter: Secondary | ICD-10-CM | POA: Diagnosis not present

## 2023-01-26 LAB — COMPREHENSIVE METABOLIC PANEL
ALT: 13 U/L (ref 0–44)
AST: 21 U/L (ref 15–41)
Albumin: 2.9 g/dL — ABNORMAL LOW (ref 3.5–5.0)
Alkaline Phosphatase: 80 U/L (ref 38–126)
Anion gap: 16 — ABNORMAL HIGH (ref 5–15)
BUN: 13 mg/dL (ref 8–23)
CO2: 27 mmol/L (ref 22–32)
Calcium: 8.8 mg/dL — ABNORMAL LOW (ref 8.9–10.3)
Chloride: 92 mmol/L — ABNORMAL LOW (ref 98–111)
Creatinine, Ser: 0.5 mg/dL — ABNORMAL LOW (ref 0.61–1.24)
GFR, Estimated: >60 mL/min (ref >60)
Glucose, Bld: 89 mg/dL (ref 70–99)
Potassium: 2.5 mmol/L — CL (ref 3.5–5.1)
Sodium: 135 mmol/L (ref 135–145)
Total Bilirubin: 1.1 mg/dL (ref 0.3–1.2)
Total Protein: 8.6 g/dL — ABNORMAL HIGH (ref 6.5–8.1)

## 2023-01-26 LAB — CBC WITH DIFFERENTIAL/PLATELET
Abs Immature Granulocytes: 0.06 10*3/uL (ref 0.00–0.07)
Basophils Absolute: 0 10*3/uL (ref 0.0–0.1)
Basophils Relative: 0 %
Eosinophils Absolute: 0 10*3/uL (ref 0.0–0.5)
Eosinophils Relative: 0 %
HCT: 38.9 % — ABNORMAL LOW (ref 39.0–52.0)
Hemoglobin: 11.8 g/dL — ABNORMAL LOW (ref 13.0–17.0)
Immature Granulocytes: 0 %
Lymphocytes Relative: 13 %
Lymphs Abs: 1.9 10*3/uL (ref 0.7–4.0)
MCH: 23.6 pg — ABNORMAL LOW (ref 26.0–34.0)
MCHC: 30.3 g/dL (ref 30.0–36.0)
MCV: 78 fL — ABNORMAL LOW (ref 80.0–100.0)
Monocytes Absolute: 1.5 10*3/uL — ABNORMAL HIGH (ref 0.1–1.0)
Monocytes Relative: 11 %
Neutro Abs: 10.7 10*3/uL — ABNORMAL HIGH (ref 1.7–7.7)
Neutrophils Relative %: 76 %
Platelets: 483 10*3/uL — ABNORMAL HIGH (ref 150–400)
RBC: 4.99 MIL/uL (ref 4.22–5.81)
RDW: 15.6 % — ABNORMAL HIGH (ref 11.5–15.5)
WBC: 14.3 10*3/uL — ABNORMAL HIGH (ref 4.0–10.5)
nRBC: 0 % (ref 0.0–0.2)

## 2023-01-26 LAB — SEDIMENTATION RATE: Sed Rate: 83 mm/hr — ABNORMAL HIGH (ref 0–20)

## 2023-01-26 LAB — SARS CORONAVIRUS 2 BY RT PCR: SARS Coronavirus 2 by RT PCR: NEGATIVE

## 2023-01-26 LAB — C-REACTIVE PROTEIN: CRP: 26.3 mg/dL — ABNORMAL HIGH (ref <1.0)

## 2023-01-26 LAB — PROTIME-INR
INR: 1.2 (ref 0.8–1.2)
Prothrombin Time: 15.5 seconds — ABNORMAL HIGH (ref 11.4–15.2)

## 2023-01-26 LAB — LACTIC ACID, PLASMA
Lactic Acid, Venous: 1.9 mmol/L (ref 0.5–1.9)
Lactic Acid, Venous: 1 mmol/L (ref 0.5–1.9)

## 2023-01-26 LAB — APTT: aPTT: 33 seconds (ref 24–36)

## 2023-01-26 LAB — PROCALCITONIN: Procalcitonin: 0.18 ng/mL

## 2023-01-26 LAB — MAGNESIUM: Magnesium: 2.6 mg/dL — ABNORMAL HIGH (ref 1.7–2.4)

## 2023-01-26 LAB — PHOSPHORUS: Phosphorus: 2.7 mg/dL (ref 2.5–4.6)

## 2023-01-26 MED ORDER — IOHEXOL 300 MG/ML  SOLN
100.0000 mL | Freq: Once | INTRAMUSCULAR | Status: AC | PRN
  Administered 2023-01-26: 100 mL via INTRAVENOUS

## 2023-01-26 MED ORDER — SODIUM CHLORIDE 0.9 % IV BOLUS
1000.0000 mL | Freq: Once | INTRAVENOUS | Status: AC
  Administered 2023-01-26: 1000 mL via INTRAVENOUS

## 2023-01-26 MED ORDER — SODIUM CHLORIDE 0.9 % IV SOLN: INTRAVENOUS | Status: DC

## 2023-01-26 MED ORDER — POTASSIUM CHLORIDE 10 MEQ/100ML IV SOLN
10.0000 meq | INTRAVENOUS | Status: AC
  Administered 2023-01-26: 10 meq via INTRAVENOUS
  Filled 2023-01-26: qty 100

## 2023-01-26 MED ORDER — FINASTERIDE 5 MG PO TABS
5.0000 mg | ORAL_TABLET | Freq: Every day | ORAL | Status: DC
  Administered 2023-01-27 – 2023-01-30 (×4): 5 mg via ORAL
  Filled 2023-01-26 (×5): qty 1

## 2023-01-26 MED ORDER — SODIUM CHLORIDE 0.9 % IV SOLN
2.0000 g | Freq: Three times a day (TID) | INTRAVENOUS | Status: DC
  Administered 2023-01-26 – 2023-01-27 (×2): 2 g via INTRAVENOUS
  Filled 2023-01-26 (×4): qty 40

## 2023-01-26 MED ORDER — FERROUS SULFATE 325 (65 FE) MG PO TABS
325.0000 mg | ORAL_TABLET | Freq: Two times a day (BID) | ORAL | Status: DC
  Administered 2023-01-27 – 2023-02-02 (×12): 325 mg via ORAL
  Filled 2023-01-26 (×12): qty 1

## 2023-01-26 MED ORDER — ONDANSETRON HCL 4 MG/2ML IJ SOLN: 4.0000 mg | Freq: Three times a day (TID) | INTRAMUSCULAR | Status: DC | PRN

## 2023-01-26 MED ORDER — POLYETHYLENE GLYCOL 3350 17 GM/SCOOP PO POWD: 17.0000 g | Freq: Every day | ORAL | Status: DC | PRN

## 2023-01-26 MED ORDER — VANCOMYCIN HCL IN DEXTROSE 1-5 GM/200ML-% IV SOLN
1000.0000 mg | Freq: Once | INTRAVENOUS | Status: AC
  Administered 2023-01-26: 1000 mg via INTRAVENOUS
  Filled 2023-01-26: qty 200

## 2023-01-26 MED ORDER — OXYCODONE-ACETAMINOPHEN 5-325 MG PO TABS: 1.0000 | ORAL_TABLET | ORAL | Status: DC | PRN

## 2023-01-26 MED ORDER — HYDRALAZINE HCL 20 MG/ML IJ SOLN: 5.0000 mg | INTRAMUSCULAR | Status: DC | PRN

## 2023-01-26 MED ORDER — POTASSIUM CHLORIDE 20 MEQ PO PACK: 40.0000 meq | PACK | Freq: Two times a day (BID) | ORAL | Status: DC

## 2023-01-26 MED ORDER — HEPARIN SODIUM (PORCINE) 5000 UNIT/ML IJ SOLN
5000.0000 [IU] | Freq: Three times a day (TID) | INTRAMUSCULAR | Status: DC
  Administered 2023-01-26 – 2023-01-27 (×3): 5000 [IU] via SUBCUTANEOUS
  Filled 2023-01-26 (×3): qty 1

## 2023-01-26 MED ORDER — CHLORTHALIDONE 25 MG PO TABS: 25.0000 mg | ORAL_TABLET | Freq: Every day | ORAL | Status: DC

## 2023-01-26 MED ORDER — POTASSIUM CHLORIDE 20 MEQ PO PACK
40.0000 meq | PACK | Freq: Two times a day (BID) | ORAL | Status: AC
  Administered 2023-01-26 – 2023-01-27 (×2): 40 meq via ORAL
  Filled 2023-01-26 (×2): qty 2

## 2023-01-26 MED ORDER — ACETAMINOPHEN 500 MG PO TABS
1000.0000 mg | ORAL_TABLET | Freq: Once | ORAL | Status: AC
  Administered 2023-01-26: 1000 mg via ORAL
  Filled 2023-01-26: qty 2

## 2023-01-26 MED ORDER — ACETAMINOPHEN 325 MG PO TABS: 650.0000 mg | ORAL_TABLET | Freq: Four times a day (QID) | ORAL | Status: DC | PRN

## 2023-01-26 MED ORDER — VANCOMYCIN HCL 750 MG/150ML IV SOLN
750.0000 mg | Freq: Once | INTRAVENOUS | Status: AC
  Administered 2023-01-26: 750 mg via INTRAVENOUS
  Filled 2023-01-26 (×2): qty 150

## 2023-01-26 MED ORDER — LACTATED RINGERS IV BOLUS
1000.0000 mL | Freq: Once | INTRAVENOUS | Status: AC
  Administered 2023-01-26: 1000 mL via INTRAVENOUS

## 2023-01-26 MED ORDER — VANCOMYCIN HCL 1250 MG/250ML IV SOLN
1250.0000 mg | Freq: Two times a day (BID) | INTRAVENOUS | Status: DC
  Administered 2023-01-27 – 2023-01-28 (×4): 1250 mg via INTRAVENOUS
  Filled 2023-01-26 (×7): qty 250

## 2023-01-26 NOTE — ED Notes (Signed)
CRITICAL LAB RESULT Potassium 2.5 Dr. Derrill Kay notified.

## 2023-01-26 NOTE — Progress Notes (Signed)
Pharmacy Antibiotic Note  Bradley [name] is a 77 y.o. male admitted on 01/26/2023 with  sacral osteomyelitis  . PMH significant for paraplegia with chronic pressure ulcers and has a history of growing ESBL organisms (E. Coli and Klebsiella). In ED, patient is febrile to 101.4 with WBC 14.3. CT of pelvis shows osteomyelitis of left ischial bone. Pharmacy has been consulted for vancomycin and meropenem dosing.  Plan: Give vancomycin 750 mg IV x1 (to complete 1750 mg load) Start vancomycin 1250 mg IV every 12 hours (eAUC 477.2, Scr 0.8, Vd 0.72 L/kg) Start meropenem 2 g IV every 8 hours based on current renal function Monitor renal function, clinical status, culture data, and LOT   Temp (24hrs), Avg:99.7 F (37.6 C), Min:98.6 F (37 C), Max:101.4 F (38.6 C)  Recent Labs  Lab 01/26/23 1344 01/26/23 1559  WBC 14.3*  --   CREATININE 0.50*  --   LATICACIDVEN  --  1.9    CrCl cannot be calculated (Unknown ideal weight.).    No Known Allergies  Antimicrobials this admission: vanocmyin 8/22 >>  meropenem 8/22 >>   Dose adjustments this admission: N/A  Microbiology results: 8/22 BCx: pending  Thank you for involving pharmacy in this patient's care.   Rockwell Alexandria, PharmD Clinical Pharmacist 01/26/2023 7:03 PM

## 2023-01-26 NOTE — ED Notes (Signed)
Attempted to call report at this time 

## 2023-01-26 NOTE — H&P (Signed)
History and Physical    Bradley [name]:[id number] DOB: Jun 22, 1945 DOA: 01/26/2023  Referring MD/NP/PA:   PCP: Marisue Ivan, MD   Patient coming from:  The patient is coming from home.     Chief Complaint: fever, chills and worsening sacral wound  HPI: Bradley [name] is a 77 y.o. male with medical history significant of paraplegia due to spinal cord injury, neurogenic bladder (using condom catheter), s/p of ostomy, hypertension, iron deficiency anemia, who presents with fever, chills, worsening sacral wound.  He states that he has history of chronic sacral wounds for a long time, his sacral wound has been worsening recently. The patient also has noticed a foul odor coming from a skin wound to his left lower buttock area.He developed fever and chills in the past several days. No significant pain due to paraplegia. His temperature is 101.4 ED today.  Patient has dry cough, no shortness of breath or chest pain.  No nausea, vomiting, diarrhea or abdominal pain.  Denies symptoms of UTI.  CT-pelvis: 1. Interim finding of large left low gluteal ulcer with surrounding inflammation and deep extension of wound to the left ischial bone. Cortical erosive changes at the left ischium consistent with osteomyelitis. Small volume gas and fluid within the left ulcer tract consistent with acute infection. 2. Large chronic sacral decubitus ulcer with chronic erosive changes involving the sacrococcygeal region. 3. Partially visualized left abdominal ostomy with large parastomal hernia containing mesenteric fat and bowel. 4. Aortic atherosclerosis.   Aortic Atherosclerosis (ICD10-I70.0).   Data reviewed independently and ED Course: pt was found to have WBC 14.3, GFR> 60, negative COVID PCR, temperature one 1.4, soft blood pressure 94/50, heart rate 112 --> 100, RR 25, oxygen saturation 96% room air.  CT scan for pelvis: 1. Interim finding of large left low gluteal ulcer with  surrounding inflammation and deep extension of wound to the left ischial bone. Cortical erosive changes at the left ischium consistent with osteomyelitis. Small volume gas and fluid within the left ulcer tract consistent with acute infection. 2. Large chronic sacral decubitus ulcer with chronic erosive changes involving the sacrococcygeal region. 3. Partially visualized left abdominal ostomy with large parastomal hernia containing mesenteric fat and bowel. 4. Aortic atherosclerosis.   Aortic Atherosclerosis (ICD10-I70.0).   EKG: I have personally reviewed.  Sinus rhythm, QTc 466, low voltage, nonspecific T wave change.   Review of Systems:   General: has fevers, chills, no body weight gain, has fatigue HEENT: no blurry vision, hearing changes or sore throat Respiratory: no dyspnea, coughing, wheezing CV: no chest pain, no palpitations GI: no nausea, vomiting, abdominal pain, diarrhea, constipation GU: no dysuria, burning on urination, increased urinary frequency, hematuria  Ext: no leg edema Neuro: no unilateral weakness, numbness, or tingling, no vision change or hearing loss Skin: has sacral wounds MSK: No muscle spasm, no deformity, no limitation of range of movement in spin Heme: No easy bruising.  Travel history: No recent long distant travel.   Allergy: No Known Allergies  Past Medical History:  Diagnosis Date   Anemia    Arthritis    Paralysis of both lower limbs (HCC)    Sacral decubitus ulcer     Past Surgical History:  Procedure Laterality Date   COLONOSCOPY  06/06/2014   INCISION AND DRAINAGE OF WOUND N/A 06/04/2018   Procedure: IRRIGATION AND DEBRIDEMENT WOUND;  Surgeon: Henrene Dodge, MD;  Location: [facility] ORS;  Service: General;  Laterality: N/A;   TONSILLECTOMY      Social  History:  reports that he quit smoking about 52 years ago. His smoking use included cigarettes. He started smoking about 57 years ago. He has a 2.5 pack-year smoking history. He has  never used smokeless tobacco. He reports that he does not drink alcohol and does not use drugs.  Family History:  Family History  Problem Relation Age of Onset   Breast cancer Mother      Prior to Admission medications   Medication Sig Start Date End Date Taking? Authorizing Provider  acetaminophen (TYLENOL) 500 MG tablet Take 1,000 mg by mouth 3 (three) times daily.     [provider]  chlorthalidone (HYGROTON) 25 MG tablet Take 1 tablet by mouth daily. 07/05/22   [provider]  ferrous sulfate 325 (65 FE) MG EC tablet Take 1 tablet (325 mg total) by mouth in the morning and at bedtime. 08/01/22   Rickard Patience, MD  finasteride (PROSCAR) 5 MG tablet Take 5 mg by mouth daily.    [provider]  fluticasone (FLONASE) 50 MCG/ACT nasal spray Place into the nose. 08/05/21   [provider]  Multiple Vitamin (MULTI-VITAMINS) TABS Take 1 tablet by mouth daily.     [provider]  polyethylene glycol powder (GLYCOLAX/MIRALAX) powder Take 17 g by mouth daily as needed for mild constipation.     [provider]  vitamin C (ASCORBIC ACID) 500 MG tablet Take 500 mg by mouth 2 (two) times daily.    [provider]    Physical Exam: Vitals:   01/26/23 1708 01/26/23 1724 01/26/23 1730 01/26/23 1800  BP: (!) 112/50  (!) 94/50 (!) 101/50  Pulse: (!) 107  (!) 105 100  Resp:   (!) 23 (!) 25  Temp:  (!) 101.4 F (38.6 C)    TempSrc:  Rectal    SpO2: 97%  97% 96%   General: Not in acute distress HEENT:       Eyes: PERRL, EOMI, no jaundice       ENT: No discharge from the ears and nose, no pharynx injection, no tonsillar enlargement.        Neck: No JVD, no bruit, no mass felt. Heme: No neck lymph node enlargement. Cardiac: S1/S2, RRR, No murmurs, No gallops or rubs. Respiratory: No rales, wheezing, rhonchi or rubs. GI: Soft, nondistended, nontender, no rebound pain, no organomegaly, BS present. GU: No hematuria Ext: No pitting leg edema  bilaterally. 1+DP/PT pulse bilaterally. Musculoskeletal: No joint deformities, No joint redness or warmth Skin: Has deep sacral wounds    Neuro: Alert, oriented X3, cranial nerves II-XII grossly intact, has paraplegia Psych: Patient is not psychotic, no suicidal or hemocidal ideation.  Labs on Admission: I have personally reviewed following labs and imaging studies  CBC: Recent Labs  Lab 01/26/23 1344  WBC 14.3*  NEUTROABS 10.7*  HGB 11.8*  HCT 38.9*  MCV 78.0*  PLT 483*   Basic Metabolic Panel: Recent Labs  Lab 01/26/23 1344  NA 135  K 2.5*  CL 92*  CO2 27  GLUCOSE 89  BUN 13  CREATININE 0.50*  CALCIUM 8.8*   GFR: CrCl cannot be calculated (Unknown ideal weight.). Liver Function Tests: Recent Labs  Lab 01/26/23 1344  AST 21  ALT 13  ALKPHOS 80  BILITOT 1.1  PROT 8.6*  ALBUMIN 2.9*   No results for input(s): "LIPASE", "AMYLASE" in the last 168 hours. No results for input(s): "AMMONIA" in the last 168 hours. Coagulation Profile: No results for input(s): "INR", "PROTIME" in  the last 168 hours. Cardiac Enzymes: No results for input(s): "CKTOTAL", "CKMB", "CKMBINDEX", "TROPONINI" in the last 168 hours. BNP (last 3 results) No results for input(s): "PROBNP" in the last 8760 hours. HbA1C: No results for input(s): "HGBA1C" in the last 72 hours. CBG: No results for input(s): "GLUCAP" in the last 168 hours. Lipid Profile: No results for input(s): "CHOL", "HDL", "LDLCALC", "TRIG", "CHOLHDL", "LDLDIRECT" in the last 72 hours. Thyroid Function Tests: No results for input(s): "TSH", "T4TOTAL", "FREET4", "T3FREE", "THYROIDAB" in the last 72 hours. Anemia Panel: No results for input(s): "VITAMINB12", "FOLATE", "FERRITIN", "TIBC", "IRON", "RETICCTPCT" in the last 72 hours. Urine analysis:    Component Value Date/Time   COLORURINE YELLOW (A) 06/02/2019 1658   APPEARANCEUR Clear 07/07/2020 1259   LABSPEC 1.025 06/02/2019 1658   LABSPEC 1.011 05/13/2014 1751    PHURINE 5.0 06/02/2019 1658   GLUCOSEU Negative 07/07/2020 1259   GLUCOSEU Negative 05/13/2014 1751   HGBUR NEGATIVE 06/02/2019 1658   BILIRUBINUR Negative 07/07/2020 1259   BILIRUBINUR Negative 05/13/2014 1751   KETONESUR 20 (A) 06/02/2019 1658   PROTEINUR Negative 07/07/2020 1259   PROTEINUR NEGATIVE 06/02/2019 1658   NITRITE Negative 07/07/2020 1259   NITRITE NEGATIVE 06/02/2019 1658   LEUKOCYTESUR 1+ (A) 07/07/2020 1259   LEUKOCYTESUR NEGATIVE 06/02/2019 1658   LEUKOCYTESUR Negative 05/13/2014 1751   Sepsis Labs: @LABRCNTIP (procalcitonin:4,lacticidven:4) ) Recent Results (from the past 240 hour(s))  SARS Coronavirus 2 by RT PCR (hospital order, performed in [facility] hospital lab) *cepheid single result test* Anterior Nasal Swab     Status: None   Collection Time: 01/26/23  3:59 PM   Specimen: Anterior Nasal Swab  Result Value Ref Range Status   SARS Coronavirus 2 by RT PCR NEGATIVE NEGATIVE Final    Comment: (NOTE) SARS-CoV-2 target nucleic acids are NOT DETECTED.  The SARS-CoV-2 RNA is generally detectable in upper and lower respiratory specimens during the acute phase of infection. The lowest concentration of SARS-CoV-2 viral copies this assay can detect is 250 copies / mL. A negative result does not preclude SARS-CoV-2 infection and should not be used as the sole basis for treatment or other patient management decisions.  A negative result may occur with improper specimen collection / handling, submission of specimen other than nasopharyngeal swab, presence of viral mutation(s) within the areas targeted by this assay, and inadequate number of viral copies (<250 copies / mL). A negative result must be combined with clinical observations, patient history, and epidemiological information.  Fact Sheet for Patients:   RoadLapTop.co.za  Fact Sheet for Healthcare Providers: http://kim-miller.com/  This test is not yet  approved or  cleared by the [place] FDA and has been authorized for detection and/or diagnosis of SARS-CoV-2 by FDA under an Emergency Use Authorization (EUA).  This EUA will remain in effect (meaning this test can be used) for the duration of the COVID-19 declaration under Section 564(b)(1) of the Act, 21 U.S.C. section 360bbb-3(b)(1), unless the authorization is terminated or revoked sooner.  Performed at [facility], [address] [place], [place] [address]      Radiological Exams on Admission: CT PELVIS W CONTRAST  Result Date: 01/26/2023 CLINICAL DATA:  Fever chills dark-colored urine worsening sacral wound EXAM: CT PELVIS WITH CONTRAST TECHNIQUE: Multidetector CT imaging of the pelvis was performed using the standard protocol following the bolus administration of intravenous contrast. RADIATION DOSE REDUCTION: This exam was performed according to the departmental dose-optimization program which includes automated exposure control, adjustment of the mA and/or kV according  to patient size and/or use of iterative reconstruction technique. CONTRAST:  OMNIPAQUE IOHEXOL 300 MG/ML  SOLN COMPARISON:  CT 10/05/2021 FINDINGS: Urinary Tract:  Bladder is unremarkable. Bowel: Partially visualized left abdominal ostomy with large parastomal hernia containing mesenteric fat and bowel. No acute bowel wall thickening. Negative appendix. Vascular/Lymphatic: Moderate aortic atherosclerosis. No aneurysm. Mildly prominent left inguinal lymph nodes measuring up to 12 mm, likely reactive. Reproductive:  Negative prostate.  Penile prosthesis. Other:  Negative for pelvic effusion or free air. Musculoskeletal: Large chronic sacral decubitus ulcer with abnormal but chronic appearing soft tissue thickening at the sacrococcygeal region with chronic erosive changes. Interim finding of large left low gluteal ulcer with extension of wound to the left ischial bone. Cortical erosive changes at the left  ischium consistent with osteomyelitis. Small volume gas and fluid within the left ulcer tract consistent with acute infection. Surrounding moderate inflammation in the soft tissues. This appears contiguous with the large sacral decubitus ulcer superficially. IMPRESSION: 1. Interim finding of large left low gluteal ulcer with surrounding inflammation and deep extension of wound to the left ischial bone. Cortical erosive changes at the left ischium consistent with osteomyelitis. Small volume gas and fluid within the left ulcer tract consistent with acute infection. 2. Large chronic sacral decubitus ulcer with chronic erosive changes involving the sacrococcygeal region. 3. Partially visualized left abdominal ostomy with large parastomal hernia containing mesenteric fat and bowel. 4. Aortic atherosclerosis. Aortic Atherosclerosis (ICD10-I70.0). Electronically Signed   By: Jasmine Pang M.D.   On: 01/26/2023 17:41      Assessment/Plan Principal Problem:   Sacral osteomyelitis (HCC) Active Problems:   Sepsis (HCC)   Neurogenic bladder   HTN (hypertension)   Hypokalemia   Iron deficiency anemia   Paraplegia (HCC)   Hx of spinal cord injury   Assessment and Plan:   Sepsis due to sacral osteomyelitis: CT-pelvis showed interim finding of large left low gluteal ulcer with surrounding inflammation and deep extension of wound to the left ischial bone. Cortical erosive changes at the left ischium consistent with osteomyelitis. Small volume gas and fluid within the left ulcer tract consistent with acute infection. Pt meets criteria for sepsis with WBC 14.3, heart rate up to 112, RR 25, fever 101.4.  Lactic acid is normal 1.9.  Currently hemodynamically stable but with soft blood pressure.  - will admit to tele bed as inpatient - Empiric antimicrobial treatment with vancomycin and meropenem - Blood cultures x 2  - ESR and CRP - wound care consult - will get Procalcitonin - IVF: 2L of LR, followed by 75  cc/h of NS - may need to consult general surgeon in AM  Neurogenic bladder -f/u UA which is ordered by EDP -Continue Proscar  HTN (hypertension) -Hold Hygroton due to softer blood pressure and sepsis -IV hydralazine as needed  Hypokalemia: Magnesium 2.6, phosphorus 2.7 -Repleted potassium  Iron deficiency anemia: Hemoglobin 11.8 (12.8 on 07/28/2022) -Continue iron supplement  Paraplegia (HCC) and Hx of spinal cord injury -Fall precaution       DVT ppx: SQ Heparin    Code Status:  Partial code (I discussed with the patient and explained the meaning of CODE STATUS, patient wants to be partial code, OK for CPR, but no intubation. I explained to the patient at that doing CPR alone without intubation usually will not be successful, but the patient still wants to be partial code).  Family Communication: I offered to call his family, but the patient states that his son was  with him here earlier, his son knows  what is going on for him.  He said I do not need to call his family.  Disposition Plan:  Anticipate discharge back to previous environment  Consults called:  none  Admission status and Level of care: Telemetry Medical:   as inpt    Dispo: The patient is from: Home              Anticipated d/c is to: Home              Anticipated d/c date is: 2 days              Patient currently is not medically stable to d/c.    Severity of Illness:  The appropriate patient status for this patient is INPATIENT. Inpatient status is judged to be reasonable and necessary in order to provide the required intensity of service to ensure the patient's safety. The patient's presenting symptoms, physical exam findings, and initial radiographic and laboratory data in the context of their chronic comorbidities is felt to place them at high risk for further clinical deterioration. Furthermore, it is not anticipated that the patient will be medically stable for discharge from the hospital within 2  midnights of admission.   * I certify that at the point of admission it is my clinical judgment that the patient will require inpatient hospital care spanning beyond 2 midnights from the point of admission due to high intensity of service, high risk for further deterioration and high frequency of surveillance required.*       Date of Service 01/26/2023    Lorretta Harp [facility]   If 7PM-7AM, please contact night-coverage [email or web address] 01/26/2023, 6:37 PM

## 2023-01-26 NOTE — ED Triage Notes (Addendum)
Pt to ED via POV from home. Pt reports fever, chills and dark colored urine. Pt has catheter in place. Pt also reports worsening sacral wound.

## 2023-01-26 NOTE — ED Provider Notes (Signed)
[facility] Provider Note    Event Date/Time   First MD Initiated Contact with Patient 01/26/23 1502     (approximate)   History   Fever and Chills   HPI  Bradley [name] is a 77 y.o. male who presents to the emergency department today because of concerns for fevers.  He states that fevers have been present for the past 3 days.  Tmax of 101.6.  Says been accompanied by chills.  The patient also has noticed a foul odor coming from a skin wound to his left lower buttock area.  He states that he has history of sacral wounds for a long time however this is a new wound.  Additionally the patient has noticed that his urine has appeared darker recently.  He does have a history of paraplegia.     Physical Exam   Triage Vital Signs: ED Triage Vitals  Encounter Vitals Group     BP 01/26/23 1342 96/80     Systolic BP Percentile --      Diastolic BP Percentile --      Pulse Rate 01/26/23 1342 91     Resp 01/26/23 1342 18     Temp 01/26/23 1342 98.9 F (37.2 C)     Temp src --      SpO2 01/26/23 1342 100 %     Weight --      Height --      Head Circumference --      Peak Flow --      Pain Score 01/26/23 1341 0     Pain Loc --      Pain Education --      Exclude from Growth Chart --     Most recent vital signs: Vitals:   01/26/23 1342  BP: 96/80  Pulse: 91  Resp: 18  Temp: 98.9 F (37.2 C)  SpO2: 100%   General: Awake, alert, oriented. CV:  Good peripheral perfusion. Tachycardia. Resp:  Normal effort.  Abd:  No distention.  Other:  Large sacral wound, no obvious pus. Wound to left lower buttock, malodorous with purulent discharge.   ED Results / Procedures / Treatments   Labs (all labs ordered are listed, but only abnormal results are displayed) Labs Reviewed  CBC WITH DIFFERENTIAL/PLATELET - Abnormal; Notable for the following components:      Result Value   WBC 14.3 (*)    Hemoglobin 11.8 (*)    HCT 38.9 (*)    MCV 78.0 (*)    MCH  23.6 (*)    RDW 15.6 (*)    Platelets 483 (*)    Neutro Abs 10.7 (*)    Monocytes Absolute 1.5 (*)    All other components within normal limits  COMPREHENSIVE METABOLIC PANEL - Abnormal; Notable for the following components:   Potassium 2.5 (*)    Chloride 92 (*)    Creatinine, Ser 0.50 (*)    Calcium 8.8 (*)    Total Protein 8.6 (*)    Albumin 2.9 (*)    Anion gap 16 (*)    All other components within normal limits  MAGNESIUM - Abnormal; Notable for the following components:   Magnesium 2.6 (*)    All other components within normal limits  PROTIME-INR - Abnormal; Notable for the following components:   Prothrombin Time 15.5 (*)    All other components within normal limits  SEDIMENTATION RATE - Abnormal; Notable for the following components:   Sed Rate 83 (*)  All other components within normal limits  SARS CORONAVIRUS 2 BY RT PCR  CULTURE, BLOOD (ROUTINE X 2)  CULTURE, BLOOD (ROUTINE X 2)  LACTIC ACID, PLASMA  PHOSPHORUS  APTT  PROCALCITONIN  LACTIC ACID, PLASMA  URINALYSIS, W/ REFLEX TO CULTURE (INFECTION SUSPECTED)  BASIC METABOLIC PANEL  CBC  C-REACTIVE PROTEIN     RADIOLOGY I independently interpreted and visualized the CT pelvis. My interpretation: concern for osteomyelitis Radiology interpretation: IMPRESSION: 1. Interim finding of large left low gluteal ulcer with surrounding inflammation and deep extension of wound to the left ischial bone. Cortical erosive changes at the left ischium consistent with osteomyelitis. Small volume gas and fluid within the left ulcer tract consistent with acute infection. 2. Large chronic sacral decubitus ulcer with chronic erosive changes involving the sacrococcygeal region. 3. Partially visualized left abdominal ostomy with large parastomal hernia containing mesenteric fat and bowel. 4. Aortic atherosclerosis.   PROCEDURES:  Critical Care performed: Yes  CRITICAL CARE Performed by: Phineas Semen   Total  critical care time: 30 minutes  Critical care time was exclusive of separately billable procedures and treating other patients.  Critical care was necessary to treat or prevent imminent or life-threatening deterioration.  Critical care was time spent personally by me on the following activities: development of treatment plan with patient and/or surrogate as well as nursing, discussions with consultants, evaluation of patient's response to treatment, examination of patient, obtaining history from patient or surrogate, ordering and performing treatments and interventions, ordering and review of laboratory studies, ordering and review of radiographic studies, pulse oximetry and re-evaluation of patient's condition.   Procedures    MEDICATIONS ORDERED IN ED: Medications - No data to display   IMPRESSION / MDM / ASSESSMENT AND PLAN / ED COURSE  I reviewed the triage vital signs and the nursing notes.                              Differential diagnosis includes, but is not limited to, wound infection, pneumonia, UTI  Patient's presentation is most consistent with acute presentation with potential threat to life or bodily function.   The patient is on the cardiac monitor to evaluate for evidence of arrhythmia and/or significant heart rate changes.  Patient presented to the emergency department today because of concerns for fevers chills and bad odor to a skin wound.  On exam patient is slightly tachycardic.  His large sacral wound actually appears quite well however he has a smaller wound to his left lower buttock which does appear infected.  Will start IV antibiotics.  In addition will check CT pelvis to evaluate for possible osteo.  CT scan is concerning for possible osteomyelitis.  Discussed with Dr. Clyde Lundborg with the hospitalist service who will plan on admission.      FINAL CLINICAL IMPRESSION(S) / ED DIAGNOSES   Final diagnoses:  Osteomyelitis, unspecified site, unspecified type [facility])      Note:  This document was prepared using Dragon voice recognition software and may include unintentional dictation errors.    Phineas Semen, MD 01/26/23 [phone]

## 2023-01-26 NOTE — ED Notes (Addendum)
Pt requesting external cath to be changed, had condom cath in place PTA. External cath changed Warm blanket provided as requested

## 2023-01-27 ENCOUNTER — Encounter: Payer: Self-pay | Admitting: Internal Medicine

## 2023-01-27 ENCOUNTER — Other Ambulatory Visit: Payer: Medicare HMO

## 2023-01-27 DIAGNOSIS — M4628 Osteomyelitis of vertebra, sacral and sacrococcygeal region: Secondary | ICD-10-CM

## 2023-01-27 LAB — BASIC METABOLIC PANEL
Anion gap: 15 (ref 5–15)
BUN: 10 mg/dL (ref 8–23)
CO2: 28 mmol/L (ref 22–32)
Calcium: 8.3 mg/dL — ABNORMAL LOW (ref 8.9–10.3)
Chloride: 99 mmol/L (ref 98–111)
Creatinine, Ser: 0.53 mg/dL — ABNORMAL LOW (ref 0.61–1.24)
GFR, Estimated: >60 mL/min (ref >60)
Glucose, Bld: 97 mg/dL (ref 70–99)
Potassium: 2.9 mmol/L — ABNORMAL LOW (ref 3.5–5.1)
Sodium: 137 mmol/L (ref 135–145)

## 2023-01-27 LAB — CBC
HCT: 30.8 % — ABNORMAL LOW (ref 39.0–52.0)
Hemoglobin: 9.9 g/dL — ABNORMAL LOW (ref 13.0–17.0)
MCH: 23.9 pg — ABNORMAL LOW (ref 26.0–34.0)
MCHC: 32.1 g/dL (ref 30.0–36.0)
MCV: 74.2 fL — ABNORMAL LOW (ref 80.0–100.0)
Platelets: 459 10*3/uL — ABNORMAL HIGH (ref 150–400)
RBC: 4.15 MIL/uL — ABNORMAL LOW (ref 4.22–5.81)
RDW: 15.6 % — ABNORMAL HIGH (ref 11.5–15.5)
WBC: 11.6 10*3/uL — ABNORMAL HIGH (ref 4.0–10.5)
nRBC: 0 % (ref 0.0–0.2)

## 2023-01-27 MED ORDER — SODIUM CHLORIDE 0.9 % IV SOLN
1.0000 g | Freq: Three times a day (TID) | INTRAVENOUS | Status: DC
  Administered 2023-01-27 – 2023-02-01 (×15): 1 g via INTRAVENOUS
  Filled 2023-01-27 (×16): qty 20

## 2023-01-27 MED ORDER — SODIUM CHLORIDE 0.9 % IV SOLN
1.0000 g | Freq: Three times a day (TID) | INTRAVENOUS | Status: DC
  Filled 2023-01-27: qty 20

## 2023-01-27 MED ORDER — POTASSIUM CHLORIDE 10 MEQ/100ML IV SOLN
10.0000 meq | INTRAVENOUS | Status: AC
  Administered 2023-01-27 (×4): 10 meq via INTRAVENOUS
  Filled 2023-01-27 (×3): qty 100

## 2023-01-27 MED ORDER — POTASSIUM CHLORIDE CRYS ER 20 MEQ PO TBCR
40.0000 meq | EXTENDED_RELEASE_TABLET | ORAL | Status: AC
  Administered 2023-01-27 (×2): 40 meq via ORAL
  Filled 2023-01-27 (×2): qty 2

## 2023-01-27 MED ORDER — COLLAGENASE 250 UNIT/GM EX OINT
TOPICAL_OINTMENT | Freq: Every day | CUTANEOUS | Status: DC
  Filled 2023-01-27: qty 30

## 2023-01-27 NOTE — Assessment & Plan Note (Signed)
UA ordered by EDP appears not collected. External catheter in place.

## 2023-01-27 NOTE — TOC Progression Note (Signed)
Transition of Care [facility]) - Progression Note    Patient Details  Name: Bradley [name] MRN: [id number] Date of Birth: 21-Aug-1945  [facility]) CM/SW Contact  Marlowe Sax, RN Phone Number: 01/27/2023, 11:44 AM  Clinical Narrative:     his sacral wound has been worsening recently. The patient also has noticed a foul odor coming from a skin wound to his left lower buttock area   Has a Cath in place chronically Has a wheelchair and hospital bed at home [facility] to follow and assist with needs   Expected Discharge Plan:  (TBD) Barriers to Discharge: Continued Medical Work up  Expected Discharge Plan and Services   Discharge Planning Services: CM Consult   Living arrangements for the past 2 months: Single Family Home                 DME Arranged: N/A DME Agency: NA                   Social Determinants of Health (SDOH) Interventions SDOH Screenings   Food Insecurity: No Food Insecurity (01/27/2023)  Housing: Low Risk  (01/27/2023)  Transportation Needs: No Transportation Needs (01/27/2023)  Utilities: Not At Risk (01/27/2023)  Financial Resource Strain: Low Risk  (05/04/2022)   Received from [facility], [facility]  Tobacco Use: Medium Risk (01/27/2023)    Readmission Risk Interventions     No data to display

## 2023-01-27 NOTE — Plan of Care (Signed)

## 2023-01-27 NOTE — Assessment & Plan Note (Signed)
POA with fever, tachycardia, tachypnea. --Mgmt as outlined above

## 2023-01-27 NOTE — Hospital Course (Signed)
HPI on admission 01/26/23:  "Bradley [name] is a 77 y.o. male with medical history significant of paraplegia due to spinal cord injury, neurogenic bladder (using condom catheter), s/p of ostomy, hypertension, iron deficiency anemia, who presents with fever, chills, worsening sacral wound.   He states that he has history of chronic sacral wounds for a long time, his sacral wound has been worsening recently. The patient also has noticed a foul odor coming from a skin wound to his left lower buttock area.He developed fever and chills in the past several days. No significant pain due to paraplegia. His temperature is 101.4 ED today.  Patient has dry cough, no shortness of breath or chest pain.  No nausea, vomiting, diarrhea or abdominal pain.  Denies symptoms of UTI."  In the ED, pt met sepsis criteria with fever 101.4 F, HR 112 and RR 25.  Lactic acid was normal.  WBC was 14.3k.  CT scan of pelvis showed findings concerning for osteomyelitis at the left ischium, chronic erosive changes of sacrococcygeal region.  Pt was admitted to hospital and started on empiric IV antibiotics with vancomycin and meropenem.    General surgery consulted to evaluate need for surgical debridement.

## 2023-01-27 NOTE — H&P (View-Only) (Signed)
Subjective:   CC: Pressure injury of skin  HPI:  Bradley [name] is a 77 y.o. male who was consulted by Denton Lank for evaluation of above.  Chronic issues with sacral and left ischial wound secondary to paraplegia.  Patient receiving ongoing wound care as an outpatient but recently noticed increased drainage and odor from the area.  Admitted for concerns of sepsis and imaging as noted below.  No complaints of pain today.  Past Medical History:  has a past medical history of Anemia, Arthritis, Paralysis of both lower limbs (HCC), and Sacral decubitus ulcer.  Past Surgical History:  has a past surgical history that includes Tonsillectomy; Colonoscopy (06/06/2014); and Incision and drainage of wound (N/A, 06/04/2018).  Family History: family history includes Breast cancer in his mother.  Social History:  reports that he quit smoking about 52 years ago. His smoking use included cigarettes. He started smoking about 57 years ago. He has a 2.5 pack-year smoking history. He has never used smokeless tobacco. He reports that he does not drink alcohol and does not use drugs.  Current Medications:  Prior to Admission medications   Medication Sig Start Date End Date Taking? Authorizing Provider  acetaminophen (TYLENOL) 500 MG tablet Take 1,000 mg by mouth 3 (three) times daily.    Yes [provider]  chlorthalidone (HYGROTON) 25 MG tablet Take 1 tablet by mouth daily. 07/05/22  Yes [provider]  ferrous sulfate 325 (65 FE) MG EC tablet Take 1 tablet (325 mg total) by mouth in the morning and at bedtime. 08/01/22  Yes Rickard Patience, MD  fluticasone [facility]) 50 MCG/ACT nasal spray Place into the nose. 08/05/21  Yes [provider]  lisinopril-hydrochlorothiazide (ZESTORETIC) 10-12.5 MG tablet Take 1 tablet by mouth daily. 11/15/22  Yes [provider]  Multiple Vitamin (MULTI-VITAMINS) TABS Take 1 tablet by mouth daily.    Yes [provider]  mupirocin ointment  (BACTROBAN) 2 % Apply 1 Application topically at bedtime. 01/20/23  Yes [provider]  polyethylene glycol powder (GLYCOLAX/MIRALAX) powder Take 17 g by mouth daily as needed for mild constipation.    Yes [provider]  vitamin C (ASCORBIC ACID) 500 MG tablet Take 500 mg by mouth 2 (two) times daily.   Yes [provider]  finasteride (PROSCAR) 5 MG tablet Take 5 mg by mouth daily. Patient not taking: Reported on 01/27/2023    [provider]    Allergies:  No Known Allergies  ROS:  General: Denies weight loss, weight gain, fatigue, fevers, chills, and night sweats. Eyes: Denies blurry vision, double vision, eye pain, itchy eyes, and tearing. Ears: Denies hearing loss, earache, and ringing in ears. Nose: Denies sinus pain, congestion, infections, runny nose, and nosebleeds. Mouth/throat: Denies hoarseness, sore throat, bleeding gums, and difficulty swallowing. Heart: Denies chest pain, palpitations, racing heart, irregular heartbeat, leg pain or swelling, and decreased activity tolerance. Respiratory: Denies breathing difficulty, shortness of breath, wheezing, cough, and sputum. GI: Denies change in appetite, heartburn, nausea, vomiting, constipation, diarrhea, and blood in stool. GU: Denies difficulty urinating, pain with urinating, urgency, frequency, blood in urine. Musculoskeletal: Denies joint stiffness, pain, swelling, muscle weakness. Skin: Denies rash, itching, mass, tumors, sores, and boils Neurologic: Denies headache, fainting, dizziness, seizures, numbness, and tingling. Psychiatric: Denies depression, anxiety, difficulty sleeping, and memory loss. Endocrine: Denies heat or cold intolerance, and increased thirst or urination. Blood/lymph: Denies easy bruising, easy bruising, and swollen glands     Objective:     BP 112/60 (BP  Location: Left Arm)   Pulse 86   Temp 98.9 F (37.2 C) (Oral)   Resp 18   Ht 6' 1.5" (1.867 m)   Wt 96.2  kg   SpO2 100%   BMI 27.60 kg/m   Constitutional :  alert, cooperative, appears stated age, and no distress  Lymphatics/Throat:  no asymmetry, masses, or scars  Respiratory:  clear to auscultation bilaterally  Cardiovascular:  regular rate and rhythm  Gastrointestinal: soft, non-tender; bowel sounds normal; no masses,  no organomegaly.     Skin: Cool and moist, clean sacral wound with no evidence of infection, stage II.  Unstageable pressure injury to left ischial area with black necrotic tissue and some purulent discharge.  Psychiatric: Normal affect, non-agitated, not confused       LABS:     Latest Ref Rng & Units 01/27/2023    5:10 AM 01/26/2023    1:44 PM 10/22/2021   11:08 AM  CMP  Glucose 70 - 99 mg/dL 97  89  75   BUN 8 - 23 mg/dL 10  13  12    Creatinine 0.61 - 1.24 mg/dL 4.13  2.44  0.10   Sodium 135 - 145 mmol/L 137  135  143   Potassium 3.5 - 5.1 mmol/L 2.9  2.5  3.7   Chloride 98 - 111 mmol/L 99  92  105   CO2 22 - 32 mmol/L 28  27  26    Calcium 8.9 - 10.3 mg/dL 8.3  8.8  9.1   Total Protein 6.5 - 8.1 g/dL  8.6  7.4   Total Bilirubin 0.3 - 1.2 mg/dL  1.1  0.4   Alkaline Phos 38 - 126 U/L  80    AST 15 - 41 U/L  21  12   ALT 0 - 44 U/L  13  8       Latest Ref Rng & Units 01/27/2023    5:10 AM 01/26/2023    1:44 PM 07/28/2022   12:51 PM  CBC  WBC 4.0 - 10.5 K/uL 11.6  14.3  9.4   Hemoglobin 13.0 - 17.0 g/dL 9.9  27.2  53.6   Hematocrit 39.0 - 52.0 % 30.8  38.9  41.5   Platelets 150 - 400 K/uL 459  483  304     RADS: Narrative & Impression  CLINICAL DATA:  Fever chills dark-colored urine worsening sacral wound   EXAM: CT PELVIS WITH CONTRAST   TECHNIQUE: Multidetector CT imaging of the pelvis was performed using the standard protocol following the bolus administration of intravenous contrast.   RADIATION DOSE REDUCTION: This exam was performed according to the departmental dose-optimization program which includes automated exposure control, adjustment  of the mA and/or kV according to patient size and/or use of iterative reconstruction technique.   CONTRAST:  OMNIPAQUE IOHEXOL 300 MG/ML  SOLN   COMPARISON:  CT 10/05/2021   FINDINGS: Urinary Tract:  Bladder is unremarkable.   Bowel: Partially visualized left abdominal ostomy with large parastomal hernia containing mesenteric fat and bowel. No acute bowel wall thickening. Negative appendix.   Vascular/Lymphatic: Moderate aortic atherosclerosis. No aneurysm. Mildly prominent left inguinal lymph nodes measuring up to 12 mm, likely reactive.   Reproductive:  Negative prostate.  Penile prosthesis.   Other:  Negative for pelvic effusion or free air.   Musculoskeletal: Large chronic sacral decubitus ulcer with abnormal but chronic appearing soft tissue thickening at the sacrococcygeal region with chronic erosive changes. Interim finding of large left low gluteal  ulcer with extension of wound to the left ischial bone. Cortical erosive changes at the left ischium consistent with osteomyelitis. Small volume gas and fluid within the left ulcer tract consistent with acute infection. Surrounding moderate inflammation in the soft tissues. This appears contiguous with the large sacral decubitus ulcer superficially.   IMPRESSION: 1. Interim finding of large left low gluteal ulcer with surrounding inflammation and deep extension of wound to the left ischial bone. Cortical erosive changes at the left ischium consistent with osteomyelitis. Small volume gas and fluid within the left ulcer tract consistent with acute infection. 2. Large chronic sacral decubitus ulcer with chronic erosive changes involving the sacrococcygeal region. 3. Partially visualized left abdominal ostomy with large parastomal hernia containing mesenteric fat and bowel. 4. Aortic atherosclerosis.   Aortic Atherosclerosis (ICD10-I70.0).     Electronically Signed   By: Jasmine Pang M.D.   On: 01/26/2023 17:41     Assessment:    Sacral wound and left ischial wound, chronic.  Acute worsening of left ischial wound concerning for infection  History of paraplegia   Plan:    Recommend formal debridement in the OR of the left ischial wound. 1. Alternatives include continued observation local wound care.  Benefits include possible symptom relief, pathologic evaluation. Discussed the risk of surgery including recurrence, post-op infxn, poor cosmesis, poor/delayed wound healing, and possible re-operation to address said risks. The risks of general anesthetic, if used, includes MI, CVA, sudden death or even reaction to anesthetic medications also discussed.   The patient verbalized understanding and all questions were answered to the patient's satisfaction.  To OR for debridement.  Case discussed with primary  labs/images/medications/previous chart entries reviewed personally and relevant changes/updates noted above.

## 2023-01-27 NOTE — Consult Note (Signed)
Subjective:   CC: Pressure injury of skin  HPI:  Bradley [name] is a 77 y.o. male who was consulted by Denton Lank for evaluation of above.  Chronic issues with sacral and left ischial wound secondary to paraplegia.  Patient receiving ongoing wound care as an outpatient but recently noticed increased drainage and odor from the area.  Admitted for concerns of sepsis and imaging as noted below.  No complaints of pain today.  Past Medical History:  has a past medical history of Anemia, Arthritis, Paralysis of both lower limbs (HCC), and Sacral decubitus ulcer.  Past Surgical History:  has a past surgical history that includes Tonsillectomy; Colonoscopy (06/06/2014); and Incision and drainage of wound (N/A, 06/04/2018).  Family History: family history includes Breast cancer in his mother.  Social History:  reports that he quit smoking about 52 years ago. His smoking use included cigarettes. He started smoking about 57 years ago. He has a 2.5 pack-year smoking history. He has never used smokeless tobacco. He reports that he does not drink alcohol and does not use drugs.  Current Medications:  Prior to Admission medications   Medication Sig Start Date End Date Taking? Authorizing Provider  acetaminophen (TYLENOL) 500 MG tablet Take 1,000 mg by mouth 3 (three) times daily.    Yes [provider]  chlorthalidone (HYGROTON) 25 MG tablet Take 1 tablet by mouth daily. 07/05/22  Yes [provider]  ferrous sulfate 325 (65 FE) MG EC tablet Take 1 tablet (325 mg total) by mouth in the morning and at bedtime. 08/01/22  Yes Rickard Patience, MD  fluticasone [facility]) 50 MCG/ACT nasal spray Place into the nose. 08/05/21  Yes [provider]  lisinopril-hydrochlorothiazide (ZESTORETIC) 10-12.5 MG tablet Take 1 tablet by mouth daily. 11/15/22  Yes [provider]  Multiple Vitamin (MULTI-VITAMINS) TABS Take 1 tablet by mouth daily.    Yes [provider]  mupirocin ointment  (BACTROBAN) 2 % Apply 1 Application topically at bedtime. 01/20/23  Yes [provider]  polyethylene glycol powder (GLYCOLAX/MIRALAX) powder Take 17 g by mouth daily as needed for mild constipation.    Yes [provider]  vitamin C (ASCORBIC ACID) 500 MG tablet Take 500 mg by mouth 2 (two) times daily.   Yes [provider]  finasteride (PROSCAR) 5 MG tablet Take 5 mg by mouth daily. Patient not taking: Reported on 01/27/2023    [provider]    Allergies:  No Known Allergies  ROS:  General: Denies weight loss, weight gain, fatigue, fevers, chills, and night sweats. Eyes: Denies blurry vision, double vision, eye pain, itchy eyes, and tearing. Ears: Denies hearing loss, earache, and ringing in ears. Nose: Denies sinus pain, congestion, infections, runny nose, and nosebleeds. Mouth/throat: Denies hoarseness, sore throat, bleeding gums, and difficulty swallowing. Heart: Denies chest pain, palpitations, racing heart, irregular heartbeat, leg pain or swelling, and decreased activity tolerance. Respiratory: Denies breathing difficulty, shortness of breath, wheezing, cough, and sputum. GI: Denies change in appetite, heartburn, nausea, vomiting, constipation, diarrhea, and blood in stool. GU: Denies difficulty urinating, pain with urinating, urgency, frequency, blood in urine. Musculoskeletal: Denies joint stiffness, pain, swelling, muscle weakness. Skin: Denies rash, itching, mass, tumors, sores, and boils Neurologic: Denies headache, fainting, dizziness, seizures, numbness, and tingling. Psychiatric: Denies depression, anxiety, difficulty sleeping, and memory loss. Endocrine: Denies heat or cold intolerance, and increased thirst or urination. Blood/lymph: Denies easy bruising, easy bruising, and swollen glands     Objective:     BP 112/60 (BP  Location: Left Arm)   Pulse 86   Temp 98.9 F (37.2 C) (Oral)   Resp 18   Ht 6' 1.5" (1.867 m)   Wt 96.2  kg   SpO2 100%   BMI 27.60 kg/m   Constitutional :  alert, cooperative, appears stated age, and no distress  Lymphatics/Throat:  no asymmetry, masses, or scars  Respiratory:  clear to auscultation bilaterally  Cardiovascular:  regular rate and rhythm  Gastrointestinal: soft, non-tender; bowel sounds normal; no masses,  no organomegaly.     Skin: Cool and moist, clean sacral wound with no evidence of infection, stage II.  Unstageable pressure injury to left ischial area with black necrotic tissue and some purulent discharge.  Psychiatric: Normal affect, non-agitated, not confused       LABS:     Latest Ref Rng & Units 01/27/2023    5:10 AM 01/26/2023    1:44 PM 10/22/2021   11:08 AM  CMP  Glucose 70 - 99 mg/dL 97  89  75   BUN 8 - 23 mg/dL 10  13  12    Creatinine 0.61 - 1.24 mg/dL 4.13  2.44  0.10   Sodium 135 - 145 mmol/L 137  135  143   Potassium 3.5 - 5.1 mmol/L 2.9  2.5  3.7   Chloride 98 - 111 mmol/L 99  92  105   CO2 22 - 32 mmol/L 28  27  26    Calcium 8.9 - 10.3 mg/dL 8.3  8.8  9.1   Total Protein 6.5 - 8.1 g/dL  8.6  7.4   Total Bilirubin 0.3 - 1.2 mg/dL  1.1  0.4   Alkaline Phos 38 - 126 U/L  80    AST 15 - 41 U/L  21  12   ALT 0 - 44 U/L  13  8       Latest Ref Rng & Units 01/27/2023    5:10 AM 01/26/2023    1:44 PM 07/28/2022   12:51 PM  CBC  WBC 4.0 - 10.5 K/uL 11.6  14.3  9.4   Hemoglobin 13.0 - 17.0 g/dL 9.9  27.2  53.6   Hematocrit 39.0 - 52.0 % 30.8  38.9  41.5   Platelets 150 - 400 K/uL 459  483  304     RADS: Narrative & Impression  CLINICAL DATA:  Fever chills dark-colored urine worsening sacral wound   EXAM: CT PELVIS WITH CONTRAST   TECHNIQUE: Multidetector CT imaging of the pelvis was performed using the standard protocol following the bolus administration of intravenous contrast.   RADIATION DOSE REDUCTION: This exam was performed according to the departmental dose-optimization program which includes automated exposure control, adjustment  of the mA and/or kV according to patient size and/or use of iterative reconstruction technique.   CONTRAST:  OMNIPAQUE IOHEXOL 300 MG/ML  SOLN   COMPARISON:  CT 10/05/2021   FINDINGS: Urinary Tract:  Bladder is unremarkable.   Bowel: Partially visualized left abdominal ostomy with large parastomal hernia containing mesenteric fat and bowel. No acute bowel wall thickening. Negative appendix.   Vascular/Lymphatic: Moderate aortic atherosclerosis. No aneurysm. Mildly prominent left inguinal lymph nodes measuring up to 12 mm, likely reactive.   Reproductive:  Negative prostate.  Penile prosthesis.   Other:  Negative for pelvic effusion or free air.   Musculoskeletal: Large chronic sacral decubitus ulcer with abnormal but chronic appearing soft tissue thickening at the sacrococcygeal region with chronic erosive changes. Interim finding of large left low gluteal  ulcer with extension of wound to the left ischial bone. Cortical erosive changes at the left ischium consistent with osteomyelitis. Small volume gas and fluid within the left ulcer tract consistent with acute infection. Surrounding moderate inflammation in the soft tissues. This appears contiguous with the large sacral decubitus ulcer superficially.   IMPRESSION: 1. Interim finding of large left low gluteal ulcer with surrounding inflammation and deep extension of wound to the left ischial bone. Cortical erosive changes at the left ischium consistent with osteomyelitis. Small volume gas and fluid within the left ulcer tract consistent with acute infection. 2. Large chronic sacral decubitus ulcer with chronic erosive changes involving the sacrococcygeal region. 3. Partially visualized left abdominal ostomy with large parastomal hernia containing mesenteric fat and bowel. 4. Aortic atherosclerosis.   Aortic Atherosclerosis (ICD10-I70.0).     Electronically Signed   By: Jasmine Pang M.D.   On: 01/26/2023 17:41     Assessment:    Sacral wound and left ischial wound, chronic.  Acute worsening of left ischial wound concerning for infection  History of paraplegia   Plan:    Recommend formal debridement in the OR of the left ischial wound. 1. Alternatives include continued observation local wound care.  Benefits include possible symptom relief, pathologic evaluation. Discussed the risk of surgery including recurrence, post-op infxn, poor cosmesis, poor/delayed wound healing, and possible re-operation to address said risks. The risks of general anesthetic, if used, includes MI, CVA, sudden death or even reaction to anesthetic medications also discussed.   The patient verbalized understanding and all questions were answered to the patient's satisfaction.  To OR for debridement.  Case discussed with primary  labs/images/medications/previous chart entries reviewed personally and relevant changes/updates noted above.

## 2023-01-27 NOTE — Assessment & Plan Note (Signed)
Neurogenic bladder Paraplegia --Increased assistance from nursing staff --Fall precautions --External urinary catheter --Continue proscar

## 2023-01-27 NOTE — Progress Notes (Signed)
   01/27/23 1000  Spiritual Encounters  Type of Visit Initial  Care provided to: Patient  Referral source Nurse (RN/NT/LPN)  Reason for visit Advance directives  OnCall Visit Yes  Spiritual Framework  Presenting Themes Rituals and practive  Patient Stress Factors Not reviewed  Interventions  Spiritual Care Interventions Made Established relationship of care and support;Compassionate presence;Reflective listening;Normalization of emotions  Intervention Outcomes  Outcomes Connection to spiritual care;Awareness of support  Spiritual Care Plan  Spiritual Care Issues Still Outstanding No further spiritual care needs at this time (see row info)   Patient did not want advance directive. I educated the patient on what a Chaplain is and what we can offer. Advise Pt that we are here if they need someone to talk to during their stay.

## 2023-01-27 NOTE — Assessment & Plan Note (Signed)
Left ischium osteomyelitis Hx of sacral osteomyelitis --General surgery consulted  --Taken to OR 8/24 for L ischial wound debridement & bone biopsy for culture --Continue IV Vanc / Merren --ID consulted, appreciate antibiotic recommendations --Monitor fever curve, CBC, hemodynamics --Offload areas as much as possible

## 2023-01-27 NOTE — Assessment & Plan Note (Signed)
Hemoglobin 11.8 on admission, stable. --Continue iron supplement --Monitor CBC

## 2023-01-27 NOTE — Plan of Care (Signed)
  Problem: Clinical Measurements: Goal: Ability to maintain clinical measurements within normal limits will improve Outcome: Progressing Goal: Will remain free from infection Outcome: Progressing   Problem: Activity: Goal: Risk for activity intolerance will decrease Outcome: Progressing   Problem: Nutrition: Goal: Adequate nutrition will be maintained Outcome: Progressing   Problem: Pain Managment: Goal: General experience of comfort will improve Outcome: Progressing   Problem: Safety: Goal: Ability to remain free from injury will improve Outcome: Progressing   Problem: Skin Integrity: Goal: Risk for impaired skin integrity will decrease Outcome: Progressing   

## 2023-01-27 NOTE — Assessment & Plan Note (Signed)
--  Hygroton held on admission due to softer blood pressure and sepsis, continue hold for now until BP running higher --IV hydralazine as needed

## 2023-01-27 NOTE — Consult Note (Addendum)
WOC Nurse Consult Note: patient longterm (since 2017) patient of [facility] for chronic Stage 4 Sacrum; L ischial wound appears to be more recent; last seen by wound care center 01/04/2023 using [facility]in's for sacrum;? Hydrofera blue and antibiotic ointment for ischial wound  Reason for Consult: sacral wound  Wound type: 1.  Stage 4 Pressure Injury Sacrum, chronic  2.  Unstageable Pressure Injury L ischium   Pressure Injury POA: Yes Measurement: 1.  Sacral PI 6 cm x 7 cm x 1 cm 90% clean pink moist 10% yellow fibrin  2.  L ischium 4 cm x 6 cm unable to obtain true depth due to 100% black gray loose necrotic tissue covering wound bed  Drainage:  tan exudate to sacral wound; L ischium with foul smelling tan exudate   Periwound: skin around sacral wound noted to white and macerated  Dressing procedure/placement/frequency:  Clean sacral wound with Vashe wound cleanser [facility] [place] [phone]), cover wound bed with silver hydrofiber [facility] [place] [phone]), fill in wound with dry gauze and cover with silicone foam or ABD pad whichever is preferred.   Clean L ischial wound with Vashe wound cleanser [facility] [place] [phone]), apply  1/4" thick layer of Santyl to wound bed, top with saline moist gauze. Top with silicone foam.  Ok to lift foam daily for change of gauze and reapplication of Santyl. Change foam every 3 days or prn soiling.   Patient should be placed on a low air loss mattress for pressure redistribution and moisture management. Patient states he has a cushion in his wheelchair at home for pressure redistribution.    POC discussed with patient, bedside nurse and primary MD.  CT scan reveals  Large chronic sacral decubitus ulcer with abnormal but chronic appearing soft tissue thickening at the sacrococcygeal region with chronic erosive changes. Interim finding of large left low gluteal ulcer with extension of wound to the left ischial bone. Cortical erosive changes at the left ischium consistent with  osteomyelitis.  Surgical consult has been made on this patient.  Any orders placed by surgeon will supercede WOC orders.   WOC team will not follow at this time. Re-consult if further needs arise.   Thank you,    Priscella Mann MSN, RN-BC, [employer] [phone]

## 2023-01-27 NOTE — Assessment & Plan Note (Signed)
Resolved with replacement. K 2.5 on admission >> 2.9 >> 3.7 this AM --Monitor BMP, Mg levels
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
## 2023-01-28 ENCOUNTER — Inpatient Hospital Stay: Payer: Medicare HMO | Admitting: Anesthesiology

## 2023-01-28 ENCOUNTER — Encounter
Admission: EM | Disposition: A | Discharge status: Home Health | Payer: Self-pay | Source: Home / Self Care | Attending: Internal Medicine

## 2023-01-28 ENCOUNTER — Other Ambulatory Visit: Payer: Self-pay

## 2023-01-28 DIAGNOSIS — M4628 Osteomyelitis of vertebra, sacral and sacrococcygeal region: Secondary | ICD-10-CM | POA: Diagnosis not present

## 2023-01-28 HISTORY — PX: WOUND DEBRIDEMENT: SHX247

## 2023-01-28 LAB — BASIC METABOLIC PANEL
Anion gap: 8 (ref 5–15)
BUN: 7 mg/dL — ABNORMAL LOW (ref 8–23)
CO2: 26 mmol/L (ref 22–32)
Calcium: 8.2 mg/dL — ABNORMAL LOW (ref 8.9–10.3)
Chloride: 106 mmol/L (ref 98–111)
Creatinine, Ser: 0.46 mg/dL — ABNORMAL LOW (ref 0.61–1.24)
GFR, Estimated: >60 mL/min (ref >60)
Glucose, Bld: 90 mg/dL (ref 70–99)
Potassium: 3.7 mmol/L (ref 3.5–5.1)
Sodium: 140 mmol/L (ref 135–145)

## 2023-01-28 LAB — CBC
HCT: 32.3 % — ABNORMAL LOW (ref 39.0–52.0)
Hemoglobin: 10.1 g/dL — ABNORMAL LOW (ref 13.0–17.0)
MCH: 23.4 pg — ABNORMAL LOW (ref 26.0–34.0)
MCHC: 31.3 g/dL (ref 30.0–36.0)
MCV: 74.8 fL — ABNORMAL LOW (ref 80.0–100.0)
Platelets: 453 10*3/uL — ABNORMAL HIGH (ref 150–400)
RBC: 4.32 MIL/uL (ref 4.22–5.81)
RDW: 15.5 % (ref 11.5–15.5)
WBC: 8 10*3/uL (ref 4.0–10.5)
nRBC: 0 % (ref 0.0–0.2)

## 2023-01-28 SURGERY — DEBRIDEMENT, WOUND
Anesthesia: General | Laterality: Left

## 2023-01-28 MED ORDER — 0.9 % SODIUM CHLORIDE (POUR BTL) OPTIME
TOPICAL | Status: DC | PRN
  Administered 2023-01-28: 500 mL

## 2023-01-28 MED ORDER — DEXMEDETOMIDINE HCL IN NACL 200 MCG/50ML IV SOLN
INTRAVENOUS | Status: DC | PRN
  Administered 2023-01-28 (×2): 4 ug via INTRAVENOUS
  Administered 2023-01-28: 20 ug via INTRAVENOUS
  Administered 2023-01-28: .4 ug/kg/h via INTRAVENOUS
  Administered 2023-01-28: 12 ug via INTRAVENOUS
  Administered 2023-01-28: 8 ug via INTRAVENOUS

## 2023-01-28 MED ORDER — ENOXAPARIN SODIUM 40 MG/0.4ML IJ SOSY
40.0000 mg | PREFILLED_SYRINGE | INTRAMUSCULAR | Status: DC
  Administered 2023-01-29 – 2023-02-02 (×5): 40 mg via SUBCUTANEOUS
  Filled 2023-01-28 (×5): qty 0.4

## 2023-01-28 MED ORDER — PHENYLEPHRINE HCL (PRESSORS) 10 MG/ML IV SOLN
INTRAVENOUS | Status: DC | PRN
Start: 2023-01-28 — End: 2023-01-28
  Administered 2023-01-28: 80 ug via INTRAVENOUS

## 2023-01-28 MED ORDER — VANCOMYCIN HCL 1250 MG/250ML IV SOLN
1250.0000 mg | Freq: Two times a day (BID) | INTRAVENOUS | Status: DC
  Administered 2023-01-29: 1250 mg via INTRAVENOUS
  Filled 2023-01-28 (×2): qty 250

## 2023-01-28 MED ORDER — PROPOFOL 10 MG/ML IV BOLUS
INTRAVENOUS | Status: AC
  Filled 2023-01-28: qty 20

## 2023-01-28 SURGICAL SUPPLY — 33 items
APL PRP STRL LF DISP 70% ISPRP (MISCELLANEOUS) ×1
BNDG CMPR 5X6 CHSV STRCH STRL (GAUZE/BANDAGES/DRESSINGS)
BNDG CMPR 75X21 PLY HI ABS (MISCELLANEOUS)
BNDG COHESIVE 6X5 TAN ST LF (GAUZE/BANDAGES/DRESSINGS) IMPLANT
CANISTER WOUND CARE 500ML ATS (WOUND CARE) IMPLANT
CHLORAPREP W/TINT 26 (MISCELLANEOUS) ×1 IMPLANT
DRAPE LAPAROTOMY 77X122 PED (DRAPES) IMPLANT
DRSG EMULSION OIL 3X3 NADH (GAUZE/BANDAGES/DRESSINGS) IMPLANT
DRSG MEPILEX FLEX 6X6 (GAUZE/BANDAGES/DRESSINGS) IMPLANT
DRSG VAC GRANUFOAM MED (GAUZE/BANDAGES/DRESSINGS) IMPLANT
ELECT REM PT RETURN 9FT ADLT (ELECTROSURGICAL) ×1
ELECTRODE REM PT RTRN 9FT ADLT (ELECTROSURGICAL) ×1 IMPLANT
GAUZE SPONGE 4X4 12PLY STRL (GAUZE/BANDAGES/DRESSINGS) IMPLANT
GAUZE STRETCH 2X75IN STRL (MISCELLANEOUS) IMPLANT
GLOVE BIOGEL PI IND STRL 7.0 (GLOVE) ×1 IMPLANT
GLOVE SURG SYN 6.5 ES PF (GLOVE) ×1 IMPLANT
GLOVE SURG SYN 6.5 PF PI (GLOVE) ×1 IMPLANT
GOWN STRL REUS W/ TWL LRG LVL3 (GOWN DISPOSABLE) ×1 IMPLANT
GOWN STRL REUS W/ TWL XL LVL3 (GOWN DISPOSABLE) ×1 IMPLANT
GOWN STRL REUS W/TWL LRG LVL3 (GOWN DISPOSABLE) ×1
GOWN STRL REUS W/TWL XL LVL3 (GOWN DISPOSABLE) ×1
KIT TURNOVER KIT A (KITS) ×1 IMPLANT
LABEL OR SOLS (LABEL) ×1 IMPLANT
MANIFOLD NEPTUNE II (INSTRUMENTS) ×1 IMPLANT
NS IRRIG 500ML POUR BTL (IV SOLUTION) ×1 IMPLANT
PACK EXTREMITY ARMC (MISCELLANEOUS) ×1 IMPLANT
PAD ABD DERMACEA PRESS 5X9 (GAUZE/BANDAGES/DRESSINGS) IMPLANT
PAD PREP OB/GYN DISP 24X41 (PERSONAL CARE ITEMS) ×1 IMPLANT
SOL PREP PVP 2OZ (MISCELLANEOUS) ×1
SOLUTION PREP PVP 2OZ (MISCELLANEOUS) ×1 IMPLANT
STOCKINETTE IMPERV 14X48 (MISCELLANEOUS) ×1 IMPLANT
TRAP FLUID SMOKE EVACUATOR (MISCELLANEOUS) ×1 IMPLANT
WATER STERILE IRR 500ML POUR (IV SOLUTION) ×1 IMPLANT

## 2023-01-28 NOTE — Plan of Care (Signed)

## 2023-01-28 NOTE — Interval H&P Note (Signed)
History and Physical Interval Note:  01/28/2023 6:01 PM  Bradley [name]  has presented today for surgery, with the diagnosis of left ischial wound.and left leg ulceration Stage IV.  The various methods of treatment have been discussed with the patient and family. After consideration of risks, benefits and other options for treatment, the patient has consented to  Procedure(s): LEFT LEG DEBRIDEMENT OF ULCER NEAR GLUTEUS (Left) as a surgical intervention.  The patient's history has been reviewed, patient examined, no change in status, stable for surgery.  I have reviewed the patient's chart and labs.  Questions were answered to the patient's satisfaction.     Anneka Studer Tonna Boehringer

## 2023-01-28 NOTE — Assessment & Plan Note (Signed)
Left ischium decubitus ulcer --Frequent repositioning --Wound care per [facility]OC RN's recommendations

## 2023-01-28 NOTE — Op Note (Signed)
Pre-Op Dx: Left ischial pressure wound Post-Op Dx: Same Anesthesia: MAC EBL: 15 mL Complications:  none apparent Specimen: Left ischial pressure wound tissue and ischium bone for culture Procedure: Debridement of left ischial pressure wound Surgeon: Tonna Boehringer  Indications for procedure: See H&P  Description of Procedure:   Preop wound measurement: 3 cm x 5 cm x 10 cm deep Postop wound measurement: 3 cm x 7 cm x 12 cm  amount of tissue removed: 2 cm x 2 cm aggregate Depth of wound: 12 cm Consent obtained, time out performed.  Patient placed in right lateral position.  Area sterilized and draped in usual position.  Necrotic tissue visible tissue removed from surrounding tissue completely using electrocautery, passed off field pending pathology.  Total aggregate measurement 2 cm x 2 cm.  Lateral incision was made to extend the opening to further visualize the very deep wound down to the ischial bone which was easily palpable.  Bone biopsy taken for culture purposes.  Additional exudate and purulent pockets were all drained within the cavity.  Wound then irrigated until clear irrigation noted.  Wound hemostasis noted, then dressed with iodoform infused Kerlix roll x 1 covered with Mepilex dressing.  Pt tolerated procedure well, and transferred to PACU in stable condition. Sponge and instrument count correct at end of procedure.

## 2023-01-28 NOTE — Progress Notes (Addendum)
Progress Note   Patient: Bradley [name]:[id number] DOB: May 26, 1946 DOA: 01/26/2023     2 DOS: the patient was seen and examined on 01/28/2023   Brief hospital course: HPI on admission 01/26/23:  "Bradley [name] is a 77 y.o. male with medical history significant of paraplegia due to spinal cord injury, neurogenic bladder (using condom catheter), s/p of ostomy, hypertension, iron deficiency anemia, who presents with fever, chills, worsening sacral wound.   He states that he has history of chronic sacral wounds for a long time, his sacral wound has been worsening recently. The patient also has noticed a foul odor coming from a skin wound to his left lower buttock area.He developed fever and chills in the past several days. No significant pain due to paraplegia. His temperature is 101.4 ED today.  Patient has dry cough, no shortness of breath or chest pain.  No nausea, vomiting, diarrhea or abdominal pain.  Denies symptoms of UTI."  In the ED, pt met sepsis criteria with fever 101.4 F, HR 112 and RR 25.  Lactic acid was normal.  WBC was 14.3k.  CT scan of pelvis showed findings concerning for osteomyelitis at the left ischium, chronic erosive changes of sacrococcygeal region.  Pt was admitted to hospital and started on empiric IV antibiotics with vancomycin and meropenem.    General surgery consulted to evaluate need for surgical debridement.    Assessment and Plan:  * Sacral osteomyelitis (HCC) Left ischium osteomyelitis --General surgery consulted  --To OR this afternoon for L ischial wound debridement --Continue IV Vanc / Merren --Will get ID's input for antibiotics on Monday --Monitor fever curve, CBC, hemodynamics --Offload areas as much as possible  Sepsis (HCC) POA with fever, tachycardia, tachypnea. --Mgmt as outlined above  Neurogenic bladder UA ordered by EDP appears not collected. External catheter in place.  HTN (hypertension) --Hold Hygroton due to softer blood  pressure and sepsis --IV hydralazine as needed  Hypokalemia Resolved with replacement. K 2.5 on admission >> 2.9 >> 3.7 this AM --Monitor BMP, Mg levels   Iron deficiency anemia Hemoglobin 11.8 on admission, stable. --Continue iron supplement --Monitor CBC  Hx of spinal cord injury Neurogenic bladder Paraplegia --Increased assistance from nursing staff --Fall precautions --External urinary catheter --Continue proscar  Paraplegia (HCC) .  Sacral decubitus ulcer Left ischium decubitus ulcer --Frequent repositioning --Wound care per [facility] RN's recommendations        Subjective: Pt awake resting in bed when seen today.  He reports going to OR around 3pm this afternoon.  States feeling well overall, better than on admission.  Denies acute complaints.  Hungry.   Physical Exam: Vitals:   01/26/23 2348 01/27/23 1454 01/27/23 2337 01/28/23 0844  BP: 112/60 (!) 117/50 102/61 119/61  Pulse: 86 94 80 76  Resp: 18 18 20 16   Temp: 98.9 F (37.2 C) 98.2 F (36.8 C) 97.8 F (36.6 C) 98.1 F (36.7 C)  TempSrc: Oral  Oral   SpO2: 100% 100% 99% 99%  Weight:      Height:       General exam: awake, alert, no acute distress HEENT: moist mucus membranes, hearing grossly normal  Respiratory system: CTAB, no wheezes, rales or rhonchi, normal respiratory effort. Cardiovascular system: normal S1/S2, RRR,   Gastrointestinal system: soft, NT, ND Central nervous system: A&O x 4. no gross focal neurologic deficits, normal speech Extremities: SCD's on BLE's, mild pedal edema no edema, normal tone Skin: dry, intact, normal temperature Psychiatry: normal mood, congruent affect, judgement and  insight appear normal   Data Reviewed:  Notable labs --- K 2.5 >> 2.9 >> 3.7,  Ca 8.2 WBC nomalized 8.0, Hbg 10.8 stable, platelets 453k  Family Communication: None present, pt able to update. Will call to update if needed.  Disposition: Status is: Inpatient Remains inpatient appropriate  because: remains on IV antibiotics and ongoing evaluation, going for surgical debridement today   Planned Discharge Destination: Home    Time spent: 38 minutes  Author: Pennie Banter, DO 01/28/2023 1:40 PM  For on call review [email or web address].

## 2023-01-28 NOTE — Anesthesia Postprocedure Evaluation (Signed)
Anesthesia Post Note  Patient: Bradley [name]  Procedure(s) Performed: LEFT LEG DEBRIDEMENT OF ULCER NEAR GLUTEUS (Left)  Patient location during evaluation: PACU Anesthesia Type: General Level of consciousness: awake and alert Pain management: pain level controlled Vital Signs Assessment: post-procedure vital signs reviewed and stable Respiratory status: spontaneous breathing, nonlabored ventilation and respiratory function stable Cardiovascular status: blood pressure returned to baseline and stable Postop Assessment: no apparent nausea or vomiting Anesthetic complications: no   No notable events documented.   Last Vitals:  Vitals:   01/27/23 2337 01/28/23 0844  BP: 102/61 119/61  Pulse: 80 76  Resp: 20 16  Temp: 36.6 C 36.7 C  SpO2: 99% 99%    Last Pain:  Vitals:   01/28/23 1500  TempSrc:   PainSc: 0-No pain                 Foye Deer
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
## 2023-01-28 NOTE — Transfer of Care (Signed)
Immediate Anesthesia Transfer of Care Note  Patient: Bradley [name]  Procedure(s) Performed: LEFT LEG DEBRIDEMENT OF ULCER NEAR GLUTEUS (Left)  Patient Location: PACU  Anesthesia Type:MAC  Level of Consciousness: awake, drowsy, patient cooperative, and responds to stimulation  Airway & Oxygen Therapy: Patient Spontanous Breathing  Post-op Assessment: Report given to RN and Post -op Vital signs reviewed and stable  Post vital signs: Reviewed and stable  Last Vitals:  Vitals Value Taken Time  BP 97/53 01/28/23 1853  Temp    Pulse 73 01/28/23 1856  Resp 26 01/28/23 1856  SpO2 98 % 01/28/23 1856  Vitals shown include unfiled device data.  Last Pain:  Vitals:   01/28/23 1500  TempSrc:   PainSc: 0-No pain         Complications: No notable events documented.

## 2023-01-29 ENCOUNTER — Encounter: Payer: Self-pay | Admitting: Surgery

## 2023-01-29 DIAGNOSIS — M4628 Osteomyelitis of vertebra, sacral and sacrococcygeal region: Secondary | ICD-10-CM | POA: Diagnosis not present

## 2023-01-29 LAB — BASIC METABOLIC PANEL
Anion gap: 8 (ref 5–15)
BUN: 7 mg/dL — ABNORMAL LOW (ref 8–23)
CO2: 26 mmol/L (ref 22–32)
Calcium: 8.1 mg/dL — ABNORMAL LOW (ref 8.9–10.3)
Chloride: 103 mmol/L (ref 98–111)
Creatinine, Ser: 0.46 mg/dL — ABNORMAL LOW (ref 0.61–1.24)
GFR, Estimated: >60 mL/min (ref >60)
Glucose, Bld: 93 mg/dL (ref 70–99)
Potassium: 3.6 mmol/L (ref 3.5–5.1)
Sodium: 137 mmol/L (ref 135–145)

## 2023-01-29 LAB — CBC
HCT: 33 % — ABNORMAL LOW (ref 39.0–52.0)
Hemoglobin: 10.1 g/dL — ABNORMAL LOW (ref 13.0–17.0)
MCH: 23.3 pg — ABNORMAL LOW (ref 26.0–34.0)
MCHC: 30.6 g/dL (ref 30.0–36.0)
MCV: 76.2 fL — ABNORMAL LOW (ref 80.0–100.0)
Platelets: 497 10*3/uL — ABNORMAL HIGH (ref 150–400)
RBC: 4.33 MIL/uL (ref 4.22–5.81)
RDW: 15.7 % — ABNORMAL HIGH (ref 11.5–15.5)
WBC: 9.1 10*3/uL (ref 4.0–10.5)
nRBC: 0 % (ref 0.0–0.2)

## 2023-01-29 MED ORDER — VANCOMYCIN HCL 1.25 G IV SOLR
1250.0000 mg | Freq: Two times a day (BID) | INTRAVENOUS | Status: DC
  Administered 2023-01-29 – 2023-01-30 (×2): 1250 mg via INTRAVENOUS
  Filled 2023-01-29 (×3): qty 25

## 2023-01-29 NOTE — Progress Notes (Signed)
Subjective:  CC: Bradley [name] is a 77 y.o. male  Hospital stay day 3, 1 Day Post-Op left ischial wound debridement  HPI: No acute issues overnight.  ROS:  General: Denies weight loss, weight gain, fatigue, fevers, chills, and night sweats. Heart: Denies chest pain, palpitations, racing heart, irregular heartbeat, leg pain or swelling, and decreased activity tolerance. Respiratory: Denies breathing difficulty, shortness of breath, wheezing, cough, and sputum. GI: Denies change in appetite, heartburn, nausea, vomiting, constipation, diarrhea, and blood in stool. GU: Denies difficulty urinating, pain with urinating, urgency, frequency, blood in urine.   Objective:   Temp:  [97.4 F (36.3 C)-98.7 F (37.1 C)] 98.1 F (36.7 C) (08/25 0858) Pulse Rate:  [61-78] 73 (08/25 0858) Resp:  [16-23] 18 (08/25 0858) BP: (93-126)/(53-70) 126/70 (08/25 0858) SpO2:  [98 %-100 %] 99 % (08/25 0858)     Height: 6' 1.5" (186.7 cm) Weight: 96.2 kg BMI (Calculated): 27.6   Intake/Output this shift:   Intake/Output Summary (Last 24 hours) at 01/29/2023 1131 Last data filed at 01/29/2023 0906 Gross per 24 hour  Intake 900 ml  Output 1380 ml  Net -480 ml    Constitutional :  alert, cooperative, appears stated age, and no distress  Respiratory:  clear to auscultation bilaterally  Cardiovascular:  regular rate and rhythm  Gastrointestinal: soft, non-tender; bowel sounds normal; no masses,  no organomegaly.   Skin: Cool and moist.  Left ischial wound dressing intact  Psychiatric: Normal affect, non-agitated, not confused       LABS:     Latest Ref Rng & Units 01/29/2023    5:02 AM 01/28/2023    4:58 AM 01/27/2023    5:10 AM  CMP  Glucose 70 - 99 mg/dL 93  90  97   BUN 8 - 23 mg/dL 7  7  10    Creatinine 0.61 - 1.24 mg/dL 5.40  9.81  1.91   Sodium 135 - 145 mmol/L 137  140  137   Potassium 3.5 - 5.1 mmol/L 3.6  3.7  2.9   Chloride 98 - 111 mmol/L 103  106  99   CO2 22 - 32 mmol/L 26  26  28     Calcium 8.9 - 10.3 mg/dL 8.1  8.2  8.3       Latest Ref Rng & Units 01/29/2023    5:02 AM 01/28/2023    4:58 AM 01/27/2023    5:10 AM  CBC  WBC 4.0 - 10.5 K/uL 9.1  8.0  11.6   Hemoglobin 13.0 - 17.0 g/dL 47.8  29.5  9.9   Hematocrit 39.0 - 52.0 % 33.0  32.3  30.8   Platelets 150 - 400 K/uL 497  453  459     RADS: N/a Assessment:   Status post left ischial wound debridement  Dressing intact.  First dressing change tomorrow.  Discussed with patient prognosis and follow-up plan pending culture results  labs/images/medications/previous chart entries reviewed personally and relevant changes/updates noted above.

## 2023-01-29 NOTE — Plan of Care (Signed)

## 2023-01-29 NOTE — Progress Notes (Signed)
Progress Note   Patient: Bradley [name] JWJ:[id number] DOB: 12-29-1945 DOA: 01/26/2023     4 DOS: the patient was seen and examined on 01/30/2023   Brief hospital course: HPI on admission 01/26/23:  "Bradley [name] is a 77 y.o. male with medical history significant of paraplegia due to spinal cord injury, neurogenic bladder (using condom catheter), s/p of ostomy, hypertension, iron deficiency anemia, who presents with fever, chills, worsening sacral wound.   He states that he has history of chronic sacral wounds for a long time, his sacral wound has been worsening recently. The patient also has noticed a foul odor coming from a skin wound to his left lower buttock area.He developed fever and chills in the past several days. No significant pain due to paraplegia. His temperature is 101.4 ED today.  Patient has dry cough, no shortness of breath or chest pain.  No nausea, vomiting, diarrhea or abdominal pain.  Denies symptoms of UTI."  In the ED, pt met sepsis criteria with fever 101.4 F, HR 112 and RR 25.  Lactic acid was normal.  WBC was 14.3k.  CT scan of pelvis showed findings concerning for osteomyelitis at the left ischium, chronic erosive changes of sacrococcygeal region.  Pt was admitted to hospital and started on empiric IV antibiotics with vancomycin and meropenem.    General surgery consulted to evaluate need for surgical debridement.    Assessment and Plan:  * Sacral osteomyelitis (HCC) Left ischium osteomyelitis --General surgery consulted  --Taken to OR 8/24 for L ischial wound debridement & bone biopsy for culture --Continue IV Vanc / Merren --Will get ID's input for antibiotics on Monday --Monitor fever curve, CBC, hemodynamics --Offload areas as much as possible  Sepsis (HCC) POA with fever, tachycardia, tachypnea. --Mgmt as outlined above  Neurogenic bladder UA ordered by EDP appears not collected. External catheter in place.  HTN (hypertension) --Hold Hygroton  due to softer blood pressure and sepsis --IV hydralazine as needed  Hypokalemia Resolved with replacement. K 2.5 on admission (Mg was 2.6) K 3.6 this AM  --Monitor BMP   Iron deficiency anemia Hemoglobin 11.8 on admission, stable. --Continue iron supplement --Monitor CBC  Hx of spinal cord injury Neurogenic bladder Paraplegia --Increased assistance from nursing staff --Fall precautions --External urinary catheter --Continue proscar  Paraplegia (HCC) .  Sacral decubitus ulcer Left ischium decubitus ulcer --Frequent repositioning --Wound care per [facility] RN's recommendations        Subjective: Pt awake resting in bed when seen today.  He reports feeling well overall.  No fever, chills or sweats.  No other acute complaints.  He asks if he will need long course IV antibiotics.  Has had to go to SNF for this in the past, last time he had bone infection.   Physical Exam: Vitals:   01/29/23 1506 01/29/23 1957 01/30/23 0041 01/30/23 0822  BP: 119/61 (!) 128/48 (!) 111/57 (!) 114/56  Pulse: 86 75 72 84  Resp: 18 16 16 16   Temp: 98.4 F (36.9 C) 98.5 F (36.9 C) 98.1 F (36.7 C) 97.6 F (36.4 C)  TempSrc:      SpO2: 100% 100% 97% 100%  Weight:      Height:       General exam: awake, alert, no acute distress HEENT: moist mucus membranes, hearing grossly normal  Respiratory system: on room air, normal respiratory effort. Cardiovascular system: RRR, stable pedal edema   Gastrointestinal system: soft, non-distended Central nervous system: A&O x 4. no gross focal neurologic deficits,  normal speech Extremities: SCD's on BLE's, mild pedal edema no edema, normal tone Skin: dry, intact, normal temperature Psychiatry: normal mood, congruent affect, judgement and insight appear normal   Data Reviewed:  Notable labs ---  BUN 7, Cr 0.46, Ca 8.1 WBC normal Hbg 10.1 stable, platelets 497k  Family Communication: None present, pt able to update. Will call to update if  needed.  Disposition: Status is: Inpatient Remains inpatient appropriate because: remains on IV antibiotics with cultures pending to determine course of therapy. May need SNF placement.   Planned Discharge Destination: Home    Time spent: 35 minutes  Author: Pennie Banter, DO 01/30/2023 9:10 AM  For on call review [email or web address].

## 2023-01-29 NOTE — Plan of Care (Signed)
  Problem: Activity: Goal: Risk for activity intolerance will decrease Outcome: Progressing   Problem: Safety: Goal: Ability to remain free from injury will improve Outcome: Progressing   Problem: Skin Integrity: Goal: Risk for impaired skin integrity will decrease Outcome: Progressing   

## 2023-01-30 ENCOUNTER — Ambulatory Visit: Payer: Medicare HMO

## 2023-01-30 ENCOUNTER — Ambulatory Visit: Payer: Medicare HMO | Admitting: Oncology

## 2023-01-30 DIAGNOSIS — Z87828 Personal history of other (healed) physical injury and trauma: Secondary | ICD-10-CM | POA: Diagnosis not present

## 2023-01-30 DIAGNOSIS — M86152 Other acute osteomyelitis, left femur: Secondary | ICD-10-CM

## 2023-01-30 DIAGNOSIS — L89154 Pressure ulcer of sacral region, stage 4: Secondary | ICD-10-CM

## 2023-01-30 LAB — BASIC METABOLIC PANEL
Anion gap: 8 (ref 5–15)
BUN: 6 mg/dL — ABNORMAL LOW (ref 8–23)
CO2: 28 mmol/L (ref 22–32)
Calcium: 8.1 mg/dL — ABNORMAL LOW (ref 8.9–10.3)
Chloride: 101 mmol/L (ref 98–111)
Creatinine, Ser: 0.43 mg/dL — ABNORMAL LOW (ref 0.61–1.24)
GFR, Estimated: >60 mL/min (ref >60)
Glucose, Bld: 93 mg/dL (ref 70–99)
Potassium: 3.2 mmol/L — ABNORMAL LOW (ref 3.5–5.1)
Sodium: 137 mmol/L (ref 135–145)

## 2023-01-30 MED ORDER — VANCOMYCIN HCL 1250 MG/250ML IV SOLN
1250.0000 mg | Freq: Two times a day (BID) | INTRAVENOUS | Status: DC
  Administered 2023-01-30 – 2023-02-01 (×4): 1250 mg via INTRAVENOUS
  Filled 2023-01-30 (×5): qty 250

## 2023-01-30 MED ORDER — POTASSIUM CHLORIDE CRYS ER 20 MEQ PO TBCR
40.0000 meq | EXTENDED_RELEASE_TABLET | Freq: Once | ORAL | Status: AC
  Administered 2023-01-30: 40 meq via ORAL
  Filled 2023-01-30: qty 2

## 2023-01-30 NOTE — TOC Progression Note (Signed)
Transition of Care [facility]) - Progression Note    Patient Details  Name: Bradley [name] MRN: [id number] Date of Birth: [birth year]  [facility] CM/SW Contact  Marlowe Sax, RN Phone Number: 01/30/2023, 2:17 PM  Clinical Narrative:    TOC continues to follow and will assist with needs and DC planning  General surgery consulted to evaluate need for surgical debridement.   Expected Discharge Plan:  (TBD) Barriers to Discharge: Continued Medical Work up  Expected Discharge Plan and Services   Discharge Planning Services: CM Consult   Living arrangements for the past 2 months: Single Family Home                 DME Arranged: N/A DME Agency: NA                   Social Determinants of Health (SDOH) Interventions SDOH Screenings   Food Insecurity: No Food Insecurity (01/27/2023)  Housing: Low Risk  (01/27/2023)  Transportation Needs: No Transportation Needs (01/27/2023)  Utilities: Not At Risk (01/27/2023)  Financial Resource Strain: Low Risk  (05/04/2022)   Received from [facility], [facility]  Tobacco Use: Medium Risk (01/27/2023)    Readmission Risk Interventions     No data to display

## 2023-01-30 NOTE — Progress Notes (Signed)
Subjective:  CC: Bradley [name] is a 77 y.o. male  Hospital stay day 4, 2 Days Post-Op left ischial wound debridement  HPI: No acute issues overnight.  ROS:  General: Denies weight loss, weight gain, fatigue, fevers, chills, and night sweats. Heart: Denies chest pain, palpitations, racing heart, irregular heartbeat, leg pain or swelling, and decreased activity tolerance. Respiratory: Denies breathing difficulty, shortness of breath, wheezing, cough, and sputum. GI: Denies change in appetite, heartburn, nausea, vomiting, constipation, diarrhea, and blood in stool. GU: Denies difficulty urinating, pain with urinating, urgency, frequency, blood in urine.   Objective:   Temp:  [97.6 F (36.4 C)-98.5 F (36.9 C)] 97.6 F (36.4 C) (08/26 1544) Pulse Rate:  [72-94] 94 (08/26 1544) Resp:  [16] 16 (08/26 1544) BP: (111-128)/(48-59) 112/59 (08/26 1544) SpO2:  [97 %-100 %] 100 % (08/26 1544)     Height: 6' 1.5" (186.7 cm) Weight: 96.2 kg BMI (Calculated): 27.6   Intake/Output this shift:   Intake/Output Summary (Last 24 hours) at 01/30/2023 1646 Last data filed at 01/30/2023 1500 Gross per 24 hour  Intake 800 ml  Output 1500 ml  Net -700 ml    Constitutional :  alert, cooperative, appears stated age, and no distress  Respiratory:  clear to auscultation bilaterally  Cardiovascular:  regular rate and rhythm  Gastrointestinal: soft, non-tender; bowel sounds normal; no masses,  no organomegaly.   Skin: Cool and moist.  Left ischial wound pic below.  Clean, no concenr for bleeding or infection.  Psychiatric: Normal affect, non-agitated, not confused       LABS:     Latest Ref Rng & Units 01/30/2023    5:09 AM 01/29/2023    5:02 AM 01/28/2023    4:58 AM  CMP  Glucose 70 - 99 mg/dL 93  93  90   BUN 8 - 23 mg/dL 6  7  7    Creatinine 0.61 - 1.24 mg/dL 0.98  1.19  1.47   Sodium 135 - 145 mmol/L 137  137  140   Potassium 3.5 - 5.1 mmol/L 3.2  3.6  3.7   Chloride 98 - 111 mmol/L 101  103   106   CO2 22 - 32 mmol/L 28  26  26    Calcium 8.9 - 10.3 mg/dL 8.1  8.1  8.2       Latest Ref Rng & Units 01/29/2023    5:02 AM 01/28/2023    4:58 AM 01/27/2023    5:10 AM  CBC  WBC 4.0 - 10.5 K/uL 9.1  8.0  11.6   Hemoglobin 13.0 - 17.0 g/dL 82.9  56.2  9.9   Hematocrit 39.0 - 52.0 % 33.0  32.3  30.8   Platelets 150 - 400 K/uL 497  453  459     RADS: N/a Assessment:   Status post left ischial wound debridement  Wound clean.  Daily wet to dry with kerlex rolls, secure with mepilex for now. F/u in one week if he's still in house for whatever reason. Once he is discharged, he can return to his wound care provider and they can take over wound care at that site.   labs/images/medications/previous chart entries reviewed personally and relevant changes/updates noted above.

## 2023-01-30 NOTE — Consult Note (Signed)
WOC Nurse ostomy consult note Stoma type/location: LLQ, colostomy  See by WOC nursing in the past, but not recently, not followed in the outpatient clinic Requested FU by nursing staff on if patient using 1 or 2 pc for needs for ordering  Ostomy pouching: 1pc. With 2" ostomy barrier ring (1pc per nursing)  [facility] [place] # [phone] (1pc flat) and [facility] [place] # [phone] (2" ostomy barrier ring)  WOC nursing has seen for chronic wounds and orders are in the computer as well. General surgery has seen wounds.  Re consult if needed, will not follow at this time. Thanks  Katie Faraone [employer], RN,CWOCN, CNS, CWON-AP [phone])

## 2023-01-30 NOTE — Plan of Care (Signed)

## 2023-01-30 NOTE — Progress Notes (Signed)
Progress Note   Patient: Bradley [name] PZW:[id number] DOB: [birth year] DOA: 01/26/2023     4 DOS: the patient was seen and examined on 01/30/2023   Brief hospital course: HPI on admission 01/26/23:  "Bradley [name] is a 77 y.o. male with medical history significant of paraplegia due to spinal cord injury, neurogenic bladder (using condom catheter), s/p of ostomy, hypertension, iron deficiency anemia, who presents with fever, chills, worsening sacral wound.   He states that he has history of chronic sacral wounds for a long time, his sacral wound has been worsening recently. The patient also has noticed a foul odor coming from a skin wound to his left lower buttock area.He developed fever and chills in the past several days. No significant pain due to paraplegia. His temperature is 101.4 ED today.  Patient has dry cough, no shortness of breath or chest pain.  No nausea, vomiting, diarrhea or abdominal pain.  Denies symptoms of UTI."  In the ED, pt met sepsis criteria with fever 101.4 F, HR 112 and RR 25.  Lactic acid was normal.  WBC was 14.3k.  CT scan of pelvis showed findings concerning for osteomyelitis at the left ischium, chronic erosive changes of sacrococcygeal region.  Pt was admitted to hospital and started on empiric IV antibiotics with vancomycin and meropenem.    General surgery consulted to evaluate need for surgical debridement.    Assessment and Plan:  * Osteomyelitis of pelvic region or thigh, acute, left (HCC) Left ischium osteomyelitis Hx of sacral osteomyelitis --General surgery consulted  --Taken to OR 8/24 for L ischial wound debridement & bone biopsy for culture --Continue IV Vanc / Merren --ID consulted, appreciate antibiotic recommendations --Monitor fever curve, CBC, hemodynamics --Offload areas as much as possible  Sepsis (HCC) POA with fever, tachycardia, tachypnea. --Mgmt as outlined above  Neurogenic bladder UA ordered by EDP appears not  collected. External catheter in place.  HTN (hypertension) --Hold Hygroton due to softer blood pressure and sepsis --IV hydralazine as needed  Hypokalemia Resolved with replacement. K 2.5 on admission (Mg was 2.6) K 3.2 this AM  - replacement ordered --Monitor BMP   Iron deficiency anemia Hemoglobin 11.8 on admission, stable. --Continue iron supplement --Monitor CBC  Hx of spinal cord injury Neurogenic bladder Paraplegia --Increased assistance from nursing staff --Fall precautions --External urinary catheter --Continue proscar  Paraplegia (HCC) .  Sacral decubitus ulcer Left ischium decubitus ulcer --Frequent repositioning --Wound care per Surgery and wound care RN's recommendations        Subjective: Pt awake resting in bed when seen today.  He reports doing okay in general.  Expressed concern about bone infection becoming long term.  He hopes antibiotics will clear this infection as was the case when it was sacral osteo years ago.     Physical Exam: Vitals:   01/29/23 1957 01/30/23 0041 01/30/23 0822 01/30/23 1544  BP: (!) 128/48 (!) 111/57 (!) 114/56 (!) 112/59  Pulse: 75 72 84 94  Resp: 16 16 16 16   Temp: 98.5 F (36.9 C) 98.1 F (36.7 C) 97.6 F (36.4 C) 97.6 F (36.4 C)  TempSrc:      SpO2: 100% 97% 100% 100%  Weight:      Height:       General exam: awake, alert, no acute distress HEENT: moist mucus membranes, hearing grossly normal  Respiratory system: on room air, normal respiratory effort. Cardiovascular system: RRR, stable pedal edema   Gastrointestinal system: soft, non-distended Central nervous system: A&O x 4.  no gross focal neurologic deficits, normal speech Extremities: SCD's on BLE's, mild pedal edema no edema, normal tone Skin: dry, intact, normal temperature Psychiatry: normal mood, congruent affect, judgement and insight appear normal   Data Reviewed:  Notable labs ---  K 3.2, BUN 6, Cr 0.43, Ca 8.1  Family Communication:  None present, pt able to update. Will call to update if needed.  Disposition: Status is: Inpatient Remains inpatient appropriate because: remains on IV antibiotics with cultures pending to determine course of therapy. May need SNF placement.   Planned Discharge Destination: Home    Time spent: 35 minutes  Author: Pennie Banter, DO 01/30/2023 7:25 PM  For on call review [email or web address].

## 2023-01-30 NOTE — Consult Note (Signed)
NAME: Bradley [name]  DOB: 02/05/1946  MRN: [id number]  Date/Time: 01/30/2023 2:02 PM  REQUESTING PROVIDER: Dr.Griffith Subjective:  REASON FOR CONSULT: infected ischial wound ? Bradley [name] is a 76 y.o. with a history of Hypertension, T7 spinal cord injury with paraplegia, neurogenic bladder, BPH iron deficiency anemia, hyperlipidemia, status post colostomy presented to the ED on 01/26/2023 with fever and chills and worsening sacral wound.  Has had sacral wound for a long time.  He noted recently a foul odor from the wound.  He developed fever and chills in the past few days Temperature in the ED was 101.4 He is followed by wound clinic.  And last saw them in July 2024  01/26/23 13:42  BP 96/80  Temp 98.9 F (37.2 C)  Pulse Rate 91  Resp 18  SpO2 100 %     Latest Reference Range & Units 01/26/23 13:44  WBC 4.0 - 10.5 K/uL 14.3 (H)  Hemoglobin 13.0 - 17.0 g/dL 11.9 (L)  HCT 14.7 - 82.9 % 38.9 (L)  Platelets 150 - 400 K/uL 483 (H)  Creatinine 0.61 - 1.24 mg/dL 5.62 (L)  CT pelvis revealed large left low gluteal ulcer with surrounding inflammation and deep extension of the wound to the left ischial bone with cortical changes to the left ischium consistent with osteomyelitis.  There was also a large chronic sacral decubitus ulcer with chronic erosive changes involving the sacrococcygeal region. He was started on broad-spectrum antibiotics with meropenem and vancomycin.  He was seen by surgeon and underwent debridement of the left ischial wound.  Deep cultures were taken.  And the bone biopsy was also sent. Asked to see the patient for further management with antibiotics.  Past medical history Hypertension Hyperlipidemia T7 vertebral injury leading to paraplegia Neurogenic bladder Microcytic anemia Sacral decubitus    Past surgical history Tonsillectomy Colostomy 11/29/2016 Hemorrhoid removal Hernia repair Wound debridement Social History   Socioeconomic History   Marital  status: Widowed    Spouse name: Not on file   Number of children: Not on file   Years of education: Not on file   Highest education level: Not on file  Occupational History   Not on file  Tobacco Use   Smoking status: Former    Current packs/day: 0.00    Average packs/day: 0.5 packs/day for 5.0 years (2.5 ttl pk-yrs)    Types: Cigarettes    Start date: 77    Quit date: 51    Years since quitting: 52.6   Smokeless tobacco: Never  Vaping Use   Vaping status: Never Used  Substance and Sexual Activity   Alcohol use: No   Drug use: No   Sexual activity: Not on file  Other Topics Concern   Not on file  Social History Narrative   Not on file   Social Determinants of Health   Financial Resource Strain: Low Risk  (05/04/2022)   Received [facility]   Overall Financial Resource Strain (CARDIA)    Difficulty of Paying Living Expenses: Not very hard  Food Insecurity: No Food Insecurity (01/27/2023)   Hunger Vital Sign    Worried About Running Out of Food in the Last Year: Never true    Ran Out of Food in the Last Year: Never true  Transportation Needs: No Transportation Needs (01/27/2023)   PRAPARE - Administrator, Civil Service (Medical): No    Lack of Transportation (Non-Medical): No  Physical Activity: Not on file  Stress: Not on file  Social Connections: Not on file  Intimate Partner Violence: Not At Risk (01/27/2023)   Humiliation, Afraid, Rape, and Kick questionnaire    Fear of Current or Ex-Partner: No    Emotionally Abused: No    Physically Abused: No    Sexually Abused: No    Family History  Problem Relation Age of Onset   Breast cancer Mother    No Known Allergies I? Current Facility-Administered Medications  Medication Dose Route Frequency Provider Last Rate Last Admin   acetaminophen (TYLENOL) tablet 650 mg  650 mg Oral Q6H PRN Tonna Boehringer, Isami, DO       collagenase (SANTYL) ointment   Topical Daily De[place], Isami, DO    Given at 01/29/23 1057   enoxaparin (LOVENOX) injection 40 mg  40 mg Subcutaneous Q24H Sakai, Isami, DO   40 mg at 01/30/23 1010   ferrous sulfate tablet 325 mg  325 mg Oral BID WC Sakai, Isami, DO   325 mg at 01/30/23 1008   hydrALAZINE (APRESOLINE) injection 5 mg  5 mg Intravenous Q2H PRN Sakai, Isami, DO       meropenem (MERREM) 1 g in sodium chloride 0.9 % 100 mL IVPB  1 g Intravenous Q8H Sakai, Isami, DO 200 mL/hr at 01/30/23 1015 1 g at 01/30/23 1015   ondansetron (ZOFRAN) injection 4 mg  4 mg Intravenous Q8H PRN Sakai, Isami, DO       polyethylene glycol powder (GLYCOLAX/MIRALAX) container 17 g  17 g Oral Daily PRN Sakai, Isami, DO       vancomycin (VANCOREADY) IVPB 1250 mg/250 mL  1,250 mg Intravenous Q12H Prince Solian F, RPH         Abtx:  Anti-infectives (From admission, onward)    Start     Dose/Rate Route Frequency Ordered Stop   01/30/23 1800  vancomycin (VANCOREADY) IVPB 1250 mg/250 mL        1,250 mg 166.7 mL/hr over 90 Minutes Intravenous Every 12 hours 01/30/23 1226     01/29/23 1800  Vancomycin (VANCOCIN) 1,250 mg in sodium chloride 0.9 % 250 mL IVPB  Status:  Discontinued        1,250 mg 166.7 mL/hr over 90 Minutes Intravenous Every 12 hours 01/29/23 1755 01/30/23 1226   01/29/23 0600  vancomycin (VANCOREADY) IVPB 1250 mg/250 mL  Status:  Discontinued        1,250 mg 166.7 mL/hr over 90 Minutes Intravenous Every 12 hours 01/28/23 2335 01/29/23 1755   01/27/23 2200  meropenem (MERREM) 1 g in sodium chloride 0.9 % 100 mL IVPB  Status:  Discontinued        1 g 200 mL/hr over 30 Minutes Intravenous Every 8 hours 01/27/23 1515 01/27/23 1536   01/27/23 1600  meropenem (MERREM) 1 g in sodium chloride 0.9 % 100 mL IVPB        1 g 200 mL/hr over 30 Minutes Intravenous Every 8 hours 01/27/23 1536     01/27/23 0500  vancomycin (VANCOREADY) IVPB 1250 mg/250 mL  Status:  Discontinued        1,250 mg 166.7 mL/hr over 90 Minutes Intravenous Every 12 hours 01/26/23 2011  01/28/23 2335   01/26/23 2200  meropenem (MERREM) 2 g in sodium chloride 0.9 % 100 mL IVPB  Status:  Discontinued        2 g 280 mL/hr over 30 Minutes Intravenous Every 8 hours 01/26/23 2011 01/27/23 1515   01/26/23 2015  vancomycin (VANCOREADY) IVPB 750 mg/150 mL  750 mg 150 mL/hr over 60 Minutes Intravenous  Once 01/26/23 2010 01/26/23 2325   01/26/23 1615  vancomycin (VANCOCIN) IVPB 1000 mg/200 mL premix        1,000 mg 200 mL/hr over 60 Minutes Intravenous  Once 01/26/23 1607 01/26/23 1935       REVIEW OF SYSTEMS:  Const:  fever,  chills, negative weight loss Eyes: negative diplopia or visual changes, negative eye pain ENT: negative coryza, negative sore throat Resp: negative cough, hemoptysis, dyspnea Cards: negative for chest pain, palpitations, lower extremity edema GU: self catheterize 4-5/d GI: colostomy Skin: negative for rash and pruritus Heme: negative for easy bruising and gum/nose bleeding MS: weakness Neurolo:paraplegia Numbness below nipple Psych: negative for feelings of anxiety, depression  Endocrine: negative for thyroid, diabetes Allergy/Immunology- negative for any medication or food allergies  Objective:  VITALS:  BP (!) 114/56 (BP Location: Right Arm)   Pulse 84   Temp 97.6 F (36.4 C)   Resp 16   Ht 6' 1.5" (1.867 m)   Wt 96.2 kg   SpO2 100%   BMI 27.60 kg/m   PHYSICAL EXAM:  General: Alert, cooperative, no distress, appears young for  age.  Head: Normocephalic, without obvious abnormality, atraumatic. Eyes: Conjunctivae clear, anicteric sclerae. Pupils are equal ENT Nares normal. No drainage or sinus tenderness. Lips, mucosa, and tongue normal. No Thrush Neck: , symmetrical, no adenopathy, thyroid: non tender no carotid bruit and no JVD. Back: did not examine Picture reviewed       Lungs: b/l air entry Heart: s1s2 Abdomen: Soft, colostomy Extremities: atraumatic, no cyanosis. No edema. No clubbing Skin: No rashes or  lesions. Or bruising Lymph: Cervical, supraclavicular normal. Neurologic: paraplegia Pertinent Labs Lab Results CBC    Component Value Date/Time   WBC 9.1 01/29/2023 0502   RBC 4.33 01/29/2023 0502   HGB 10.1 (L) 01/29/2023 0502   HGB 8.2 (L) 05/16/2014 0411   HCT 33.0 (L) 01/29/2023 0502   HCT 26.4 (L) 05/16/2014 0411   PLT 497 (H) 01/29/2023 0502   PLT 424 05/16/2014 0411   MCV 76.2 (L) 01/29/2023 0502   MCV 70 (L) 05/16/2014 0411   MCH 23.3 (L) 01/29/2023 0502   MCHC 30.6 01/29/2023 0502   RDW 15.7 (H) 01/29/2023 0502   RDW 16.9 (H) 05/16/2014 0411   LYMPHSABS 1.9 01/26/2023 1344   LYMPHSABS 1.8 05/16/2014 0411   MONOABS 1.5 (H) 01/26/2023 1344   MONOABS 0.5 05/16/2014 0411   EOSABS 0.0 01/26/2023 1344   EOSABS 0.3 05/16/2014 0411   BASOSABS 0.0 01/26/2023 1344   BASOSABS 0.0 05/16/2014 0411       Latest Ref Rng & Units 01/30/2023    5:09 AM 01/29/2023    5:02 AM 01/28/2023    4:58 AM  CMP  Glucose 70 - 99 mg/dL 93  93  90   BUN 8 - 23 mg/dL 6  7  7    Creatinine 0.61 - 1.24 mg/dL 9.52  8.41  3.24   Sodium 135 - 145 mmol/L 137  137  140   Potassium 3.5 - 5.1 mmol/L 3.2  3.6  3.7   Chloride 98 - 111 mmol/L 101  103  106   CO2 22 - 32 mmol/L 28  26  26    Calcium 8.9 - 10.3 mg/dL 8.1  8.1  8.2       Microbiology: Recent Results (from the past 240 hour(s))  Blood culture (routine x 2)     Status: None (Preliminary result)  Collection Time: 01/26/23  3:45 PM   Specimen: BLOOD  Result Value Ref Range Status   Specimen Description BLOOD BLOOD RIGHT ARM  Final   Special Requests   Final    BOTTLES DRAWN AEROBIC AND ANAEROBIC Blood Culture adequate volume   Culture   Final    NO GROWTH 4 DAYS Performed at [facility], [address] [place], [place] [address]    Report Status PENDING  Incomplete  SARS Coronavirus 2 by RT PCR (hospital order, performed in [facility] hospital lab) *cepheid single result test* Anterior Nasal Swab     Status: None    Collection Time: 01/26/23  3:59 PM   Specimen: Anterior Nasal Swab  Result Value Ref Range Status   SARS Coronavirus 2 by RT PCR NEGATIVE NEGATIVE Final    Comment: (NOTE) SARS-CoV-2 target nucleic acids are NOT DETECTED.  The SARS-CoV-2 RNA is generally detectable in upper and lower respiratory specimens during the acute phase of infection. The lowest concentration of SARS-CoV-2 viral copies this assay can detect is 250 copies / mL. A negative result does not preclude SARS-CoV-2 infection and should not be used as the sole basis for treatment or other patient management decisions.  A negative result may occur with improper specimen collection / handling, submission of specimen other than nasopharyngeal swab, presence of viral mutation(s) within the areas targeted by this assay, and inadequate number of viral copies (<250 copies / mL). A negative result must be combined with clinical observations, patient history, and epidemiological information.  Fact Sheet for Patients:   RoadLapTop.co.za  Fact Sheet for Healthcare Providers: http://kim-miller.com/  This test is not yet approved or  cleared by the [place] FDA and has been authorized for detection and/or diagnosis of SARS-CoV-2 by FDA under an Emergency Use Authorization (EUA).  This EUA will remain in effect (meaning this test can be used) for the duration of the COVID-19 declaration under Section 564(b)(1) of the Act, 21 U.S.C. section 360bbb-3(b)(1), unless the authorization is terminated or revoked sooner.  Performed at [facility], [address] [place], [place] [address]   Blood culture (routine x 2)     Status: None (Preliminary result)   Collection Time: 01/26/23  3:59 PM   Specimen: BLOOD  Result Value Ref Range Status   Specimen Description BLOOD LEFT ANTECUBITAL  Final   Special Requests   Final    BOTTLES DRAWN AEROBIC AND ANAEROBIC Blood Culture  adequate volume   Culture   Final    NO GROWTH 4 DAYS Performed at [facility], [address] [place], [place] [address]    Report Status PENDING  Incomplete  Aerobic/Anaerobic Culture w Gram Stain (surgical/deep wound)     Status: None (Preliminary result)   Collection Time: 01/28/23  6:28 PM   Specimen: Path Tissue  Result Value Ref Range Status   Specimen Description   Final    TISSUE Performed at [facility], [address] [place], [place] [address]    Special Requests ISCHIAL WOUND TISSUE  Final   Gram Stain   Final    RARE WBC SEEN ABUNDANT GRAM POSITIVE COCCI ABUNDANT GRAM NEGATIVE RODS FEW GRAM POSITIVE RODS    Culture   Final    RARE PROTEUS MIRABILIS FEW DIPHTHEROIDS(CORYNEBACTERIUM SPECIES) Standardized susceptibility testing for this organism is not available. Performed at [facility], [address], [place], [place] [address]    Report Status PENDING  Incomplete  Aerobic/Anaerobic Culture w  Gram Stain (surgical/deep wound)     Status: None (Preliminary result)   Collection Time: 01/28/23  6:37 PM   Specimen: PATH Bone resection; Tissue  Result Value Ref Range Status   Specimen Description   Final    TISSUE Performed at [facility], [address] [place], [place] [address]    Special Requests   Final    BONE RESECT Performed at [facility], [address] [place], [place] [address]    Gram Stain   Final    RARE WBC SEEN FEW GRAM POSITIVE COCCI FEW GRAM NEGATIVE RODS    Culture   Final    RARE PROTEUS MIRABILIS CULTURE REINCUBATED FOR BETTER GROWTH Performed at [facility], [address], [place], [place] [address]    Report Status PENDING  Incomplete    IMAGING RESULTS: I have personally reviewed the films ? Impression/Recommendation Fever secondary to Chronic left ischial decubitus ulcer with underlying bone involvement Chronic sacral wound S/p debridement of the left ischial  wound- culture and pathology of bone sent Currently on meropenem and vanco Await results to decide on route and duration of antibiotic  Paraplegia  Neurogenic bladder- CIC  Anemia    ? ? ___________________________________________________ Discussed with patient, son and requesting provider Note:  This document was prepared using Dragon voice recognition software and may include unintentional dictation errors.

## 2023-01-31 DIAGNOSIS — M86152 Other acute osteomyelitis, left femur: Secondary | ICD-10-CM | POA: Diagnosis not present

## 2023-01-31 LAB — BASIC METABOLIC PANEL
Anion gap: 7 (ref 5–15)
BUN: 7 mg/dL — ABNORMAL LOW (ref 8–23)
CO2: 29 mmol/L (ref 22–32)
Calcium: 8.2 mg/dL — ABNORMAL LOW (ref 8.9–10.3)
Chloride: 104 mmol/L (ref 98–111)
Creatinine, Ser: 0.51 mg/dL — ABNORMAL LOW (ref 0.61–1.24)
GFR, Estimated: >60 mL/min (ref >60)
Glucose, Bld: 92 mg/dL (ref 70–99)
Potassium: 3.8 mmol/L (ref 3.5–5.1)
Sodium: 140 mmol/L (ref 135–145)

## 2023-01-31 LAB — C-REACTIVE PROTEIN: CRP: 7.2 mg/dL — ABNORMAL HIGH (ref <1.0)

## 2023-01-31 LAB — CULTURE, BLOOD (ROUTINE X 2)
Culture: NO GROWTH
Culture: NO GROWTH
Special Requests: ADEQUATE
Special Requests: ADEQUATE

## 2023-01-31 LAB — MAGNESIUM: Magnesium: 2.2 mg/dL (ref 1.7–2.4)

## 2023-01-31 LAB — VANCOMYCIN, TROUGH: Vancomycin Tr: 18 ug/mL (ref 15–20)

## 2023-01-31 LAB — PROCALCITONIN: Procalcitonin: <0.1 ng/mL

## 2023-01-31 MED ORDER — POLYETHYLENE GLYCOL 3350 17 G PO PACK
17.0000 g | PACK | Freq: Every day | ORAL | Status: DC | PRN
  Administered 2023-01-31: 17 g via ORAL
  Filled 2023-01-31: qty 1

## 2023-01-31 NOTE — TOC Progression Note (Signed)
Transition of Care [facility]) - Progression Note    Patient Details  Name: Bradley [name] MRN: [id number] Date of Birth: [birth year]  Transition of Care [facility] CM/SW Contact  Marlowe Sax, RN Phone Number: 01/31/2023, 2:03 PM  Clinical Narrative:    TOC continues to follow and will assist with DC planning and needs Underwent debridement of the left ischial wound- culture and pathology of bone sent Currently on meropenem and vanco Await results to decide on route and duration of antibiotic  Expected Discharge Plan:  (TBD) Barriers to Discharge: Continued Medical Work up  Expected Discharge Plan and Services   Discharge Planning Services: CM Consult   Living arrangements for the past 2 months: Single Family Home                 DME Arranged: N/A DME Agency: NA                   Social Determinants of Health (SDOH) Interventions SDOH Screenings   Food Insecurity: No Food Insecurity (01/27/2023)  Housing: Low Risk  (01/27/2023)  Transportation Needs: No Transportation Needs (01/27/2023)  Utilities: Not At Risk (01/27/2023)  Financial Resource Strain: Low Risk  (05/04/2022)   Received from [facility]  Tobacco Use: Medium Risk (01/27/2023)    Readmission Risk Interventions     No data to display

## 2023-01-31 NOTE — Progress Notes (Signed)
Progress Note   Patient: Bradley [name] ZOX:[id number] DOB: 01-21-1946 DOA: 01/26/2023     5 DOS: the patient was seen and examined on 01/31/2023   Brief hospital course: HPI on admission 01/26/23:  "FERREL WALSER is a 77 y.o. male with medical history significant of paraplegia due to spinal cord injury, neurogenic bladder (using condom catheter), s/p of ostomy, hypertension, iron deficiency anemia, who presents with fever, chills, worsening sacral wound.   He states that he has history of chronic sacral wounds for a long time, his sacral wound has been worsening recently. The patient also has noticed a foul odor coming from a skin wound to his left lower buttock area.He developed fever and chills in the past several days. No significant pain due to paraplegia. His temperature is 101.4 ED today.  Patient has dry cough, no shortness of breath or chest pain.  No nausea, vomiting, diarrhea or abdominal pain.  Denies symptoms of UTI."  In the ED, pt met sepsis criteria with fever 101.4 F, HR 112 and RR 25.  Lactic acid was normal.  WBC was 14.3k.  CT scan of pelvis showed findings concerning for osteomyelitis at the left ischium, chronic erosive changes of sacrococcygeal region.  Pt was admitted to hospital and started on empiric IV antibiotics with vancomycin and meropenem.    General surgery consulted to evaluate need for surgical debridement.    Assessment and Plan:  * Osteomyelitis of pelvic region or thigh, acute, left (HCC) Left ischium osteomyelitis Hx of sacral osteomyelitis --General surgery consulted  --Taken to OR 8/24 for L ischial wound debridement & bone biopsy for culture --Continue IV Vanc / Merren --Follow cultures for susceptibilities --ID consulted, appreciate antibiotic recommendations --Monitor fever curve, CBC, hemodynamics --Offload areas as much as possible  Sepsis (HCC) POA with fever, tachycardia, tachypnea. --Mgmt as outlined above  Neurogenic bladder UA  ordered by EDP appears not collected. External catheter in place.  HTN (hypertension) --Hygroton held on admission due to softer blood pressure and sepsis, continue hold for now until BP running higher --IV hydralazine as needed  Hypokalemia Resolved with replacement. K 2.5 on admission (Mg was 2.6) K 3.2 replaced 8/26 --Monitor BMP   Iron deficiency anemia Hemoglobin 11.8 on admission, stable. --Continue iron supplement --Monitor CBC  Hx of spinal cord injury Neurogenic bladder Paraplegia --Increased assistance from nursing staff --Fall precautions --External urinary catheter --Continue proscar  Paraplegia (HCC) .  Sacral decubitus ulcer Left ischium decubitus ulcer --Frequent repositioning --Wound care per Surgery and wound care RN's recommendations        Subjective: Pt awake resting in bed when seen today.  States he feels well overall.  No fever/chills.  No acute complaints.  Eager to get final culture results and antibiotic recommendation.  Hopes for a PO option so he can return home vs go to SNF.   Physical Exam: Vitals:   01/30/23 0822 01/30/23 1544 01/30/23 2224 01/31/23 0726  BP: (!) 114/56 (!) 112/59 (!) 132/52 116/67  Pulse: 84 94 80 80  Resp: 16 16 18 18   Temp: 97.6 F (36.4 C) 97.6 F (36.4 C) 98.6 F (37 C) 97.6 F (36.4 C)  TempSrc:      SpO2: 100% 100% 99% 99%  Weight:      Height:       General exam: awake, alert, no acute distress HEENT: moist mucus membranes, hearing grossly normal  Respiratory system: on room air, normal respiratory effort. Cardiovascular system: RRR, stable pedal edema  Gastrointestinal system: soft, non-distended Central nervous system: A&O x 4. no gross focal neurologic deficits, normal speech Extremities: SCD's on BLE's, mild pedal edema no edema, normal tone Skin: dry, intact, normal temperature Psychiatry: normal mood, congruent affect, judgement and insight appear normal   Data Reviewed:  Notable  labs ---  BUN 7, Cr 0.51, Ca 8.2 CRP 7.2 improved from 26.3 Procal < 0.10 No CBC today, leukocytosis had resolved x 2 and mild anemia stable.  Micro:  culture sensitivities pending Ischial wound -- gram stain abundant GPC's, abundant GPR's, few GPR's -- rare proteus mirabilis -- few diphtheroids Bone culture -- few GPC's, few GPR's -- rare proteus mirabilis   Family Communication: None present, pt able to update. Will call to update if needed.  Disposition: Status is: Inpatient Remains inpatient appropriate because: remains on IV antibiotics with cultures pending to determine course of therapy. May need SNF placement.   Planned Discharge Destination: Home    Time spent: 35 minutes  Author: Pennie Banter, DO 01/31/2023 2:09 PM  For on call review [email or web address].

## 2023-01-31 NOTE — Progress Notes (Addendum)
Pharmacy Antibiotic Note  Bradley [name] is a 77 y.o. male admitted on 01/26/2023 with  sacral osteomyelitis  . PMH significant for paraplegia with chronic pressure ulcers and has a history of growing ESBL organisms (E. Coli and Klebsiella). In ED, patient is febrile to 101.4 with WBC 14.3. CT of pelvis shows osteomyelitis of left ischial bone. Pharmacy has been consulted for vancomycin and meropenem dosing.  On 8/24, patient underwent I&D of deep wound extending from left gluteal ulcer to the left ischial bone. Cultures from wound tissue and bone resection were collected and thus far show rare P. Mirabilis, few Corynebacterium, abundant GPCs, abundant GNRs, with results still processing.  A vancomycin trough was collected last night: 18 mg/L. This is within the desired trough range of 15-20 mg/L for osteomyelitis.  Plan: Day #6 of antibiotics Continue vancomycin 1250 mg IV q12H Continue meropenem 1 g IV q8H Continue to monitor troughs periodically, especially given potential altered protein binding and other PK/PD effects in the setting of paraplegia Continue to monitor culture results and optimize antibiotic therapy as appropriate in coordination and collaboration with ID  Height: 6' 1.5" (186.7 cm) Weight: 96.2 kg (212 lb 1.3 oz) IBW/kg (Calculated) : 81.06 Temp (24hrs), Avg:97.9 F (36.6 C), Min:97.6 F (36.4 C), Max:98.6 F (37 C)  Recent Labs  Lab 01/26/23 1344 01/26/23 1559 01/26/23 2008 01/27/23 0510 01/28/23 0458 01/29/23 0502 01/30/23 0509 01/30/23 1737 01/31/23 0429  WBC 14.3*  --   --  11.6* 8.0 9.1  --   --   --   CREATININE 0.50*  --   --  0.53* 0.46* 0.46* 0.43*  --  0.51*  LATICACIDVEN  --  1.9 1.0  --   --   --   --   --   --   VANCOTROUGH  --   --   --   --   --   --   --  18  --     Estimated Creatinine Clearance: 85.9 mL/min (A) (by C-G formula based on SCr of 0.51 mg/dL (L)).    No Known Allergies  Antimicrobials this admission: Meropenem 8/22  >> Vancomycin 8/22 >>  Dose adjustments this admission: 8/23: Meropenem 2 g q8H >> 1 g q8H  Microbiology results: 8/22 Bcx: NGF 8/22 COVID PCR: Negative 8/24 Tissue/bone cx: Rare P mirabilis, Few Corynebacterium spp, Abundant GPCs and GNRs, Few GPRs   Thank you for involving pharmacy in this patient's care.   Will M. Dareen Piano, PharmD Clinical Pharmacist 01/31/2023 8:55 AM

## 2023-01-31 NOTE — Consult Note (Signed)
WOC consulted for sacral and ischial wounds, this patient has been seen by both WOC nursing and general surgery while inpatient. Orders verified and updated for sacral and ischial wound. LALM in place for moisture management and pressure redistribution.   General surgery noted for patient to follow up with wound care provider outpatient.    Re consult if needed, will not follow at this time. Thanks  Ezella Kell [employer], RN,CWOCN, CNS, CWON-AP [phone])

## 2023-01-31 NOTE — Plan of Care (Signed)

## 2023-02-01 ENCOUNTER — Ambulatory Visit: Payer: Medicare HMO | Admitting: Internal Medicine

## 2023-02-01 DIAGNOSIS — A419 Sepsis, unspecified organism: Secondary | ICD-10-CM

## 2023-02-01 DIAGNOSIS — G8220 Paraplegia, unspecified: Secondary | ICD-10-CM

## 2023-02-01 DIAGNOSIS — M86152 Other acute osteomyelitis, left femur: Secondary | ICD-10-CM | POA: Diagnosis not present

## 2023-02-01 DIAGNOSIS — E876 Hypokalemia: Secondary | ICD-10-CM | POA: Diagnosis not present

## 2023-02-01 LAB — CREATININE, SERUM
Creatinine, Ser: 0.53 mg/dL — ABNORMAL LOW (ref 0.61–1.24)
GFR, Estimated: >60 mL/min (ref >60)

## 2023-02-01 MED ORDER — SODIUM CHLORIDE 0.9 % IV SOLN
2.0000 g | Freq: Every day | INTRAVENOUS | Status: DC
  Administered 2023-02-01 – 2023-02-02 (×2): 2 g via INTRAVENOUS
  Filled 2023-02-01 (×3): qty 20

## 2023-02-01 NOTE — Plan of Care (Signed)
  Problem: Education: Goal: Knowledge of General Education information will improve Description: Including pain rating scale, medication(s)/side effects and non-pharmacologic comfort measures Outcome: Progressing   Problem: Health Behavior/Discharge Planning: Goal: Ability to manage health-related needs will improve Outcome: Progressing   Problem: Clinical Measurements: Goal: Will remain free from infection Outcome: Progressing Goal: Diagnostic test results will improve Outcome: Progressing   Problem: Nutrition: Goal: Adequate nutrition will be maintained Outcome: Progressing   Problem: Pain Managment: Goal: General experience of comfort will improve Outcome: Progressing   Problem: Skin Integrity: Goal: Risk for impaired skin integrity will decrease Outcome: Progressing

## 2023-02-01 NOTE — Plan of Care (Signed)
  Problem: Activity: Goal: Risk for activity intolerance will decrease Outcome: Progressing   Problem: Elimination: Goal: Will not experience complications related to bowel motility Outcome: Progressing   Problem: Safety: Goal: Ability to remain free from injury will improve Outcome: Progressing   Problem: Skin Integrity: Goal: Risk for impaired skin integrity will decrease Outcome: Progressing   

## 2023-02-01 NOTE — Addendum Note (Signed)
Addendum  created 02/01/23 1346 by Irving Burton, CRNA   Intraprocedure Attestations deleted, Intraprocedure Staff edited

## 2023-02-01 NOTE — Progress Notes (Signed)
PROGRESS NOTE    Bradley [name]  ZOX:[id number] DOB: 20-Jun-1945 DOA: 01/26/2023 PCP: Marisue Ivan, MD    Brief Narrative:   HPI on admission 01/26/23:  "Bradley [name] is a 77 y.o. male with medical history significant of paraplegia due to spinal cord injury, neurogenic bladder (using condom catheter), s/p of ostomy, hypertension, iron deficiency anemia, who presents with fever, chills, worsening sacral wound.   He states that he has history of chronic sacral wounds for a long time, his sacral wound has been worsening recently. The patient also has noticed a foul odor coming from a skin wound to his left lower buttock area.He developed fever and chills in the past several days. No significant pain due to paraplegia. His temperature is 101.4 ED today.  Patient has dry cough, no shortness of breath or chest pain.  No nausea, vomiting, diarrhea or abdominal pain.  Denies symptoms of UTI."   In the ED, pt met sepsis criteria with fever 101.4 F, HR 112 and RR 25.  Lactic acid was normal.  WBC was 14.3k.   CT scan of pelvis showed findings concerning for osteomyelitis at the left ischium, chronic erosive changes of sacrococcygeal region.   Pt was admitted to hospital and started on empiric IV antibiotics with vancomycin and meropenem.     8/24: Left ischial pressure wound debridement 8/25-8/28: Wound care, ID consult, waiting for culture sensitivity  Assessment & Plan:   Principal Problem:   Osteomyelitis of pelvic region or thigh, acute, left (HCC) Active Problems:   Sepsis (HCC)   Neurogenic bladder   HTN (hypertension)   Hypokalemia   Iron deficiency anemia   Paraplegia (HCC)   Hx of spinal cord injury   Sacral decubitus ulcer    * Osteomyelitis of pelvic region or thigh, acute, left (HCC) Left ischium osteomyelitis Hx of sacral osteomyelitis --General surgery consulted  --Taken to OR 8/24 for L ischial wound debridement & bone biopsy for culture --Continue IV Rocephin  for now.  Likely discharge on oral Levaquin per ID --Follow cultures for susceptibilities --ID following, appreciate antibiotic recommendations --Monitor fever curve, CBC, hemodynamics --Offload areas as much as possible.  Turn every 2 hours when awake   Sepsis (HCC) POA with fever, tachycardia, tachypnea. --Mgmt as outlined above   Neurogenic bladder UA ordered by EDP appears not collected. External catheter in place.   HTN (hypertension) --Hygroton held on admission due to softer blood pressure and sepsis, continue hold for now until BP running higher --IV hydralazine as needed   Hypokalemia Resolved with replacement. K 2.5 on admission (Mg was 2.6) K 3.2 replaced 8/26 --Monitor BMP     Iron deficiency anemia Hemoglobin 11.8 on admission, stable. --Continue iron supplement --Monitor CBC   Hx of spinal cord injury Neurogenic bladder Paraplegia --Increased assistance from nursing staff.  He is wheelchair-bound at baseline --Fall precautions --External urinary catheter --Continue proscar   Paraplegia (HCC) .  He is wheelchair-bound at baseline   Sacral decubitus ulcer Left ischium decubitus ulcer --Frequent repositioning --Wound care per Surgery and wound care RN's recommendations     DVT prophylaxis: (Lovenox enoxaparin (LOVENOX) injection 40 mg Start: 01/29/23 0800 SCD's Start: 01/28/23 2329     Code Status: Full code Family Communication: NO "discussed with patient") Disposition Plan: Home with home health.  At baseline he primarily stays in the wheelchair   Consultants:  Surgery ID WOC  Procedures:  Debridement of left ischial pressure wound on 8/24  Antimicrobials:  IV Rocephin  Subjective: No new complaints  Objective: Vitals:   01/31/23 1559 01/31/23 2336 02/01/23 0852 02/01/23 1625  BP: (!) 122/55 (!) 128/57 (!) 117/56 125/66  Pulse: 77 71 88 79  Resp: 16 18 18 18   Temp: 98.1 F (36.7 C) 98 F (36.7 C) 97.8 F (36.6 C) 98.3 F  (36.8 C)  TempSrc:    Oral  SpO2: 100% 99% 100% 99%  Weight:      Height:        Intake/Output Summary (Last 24 hours) at 02/01/2023 2056 Last data filed at 02/01/2023 1852 Gross per 24 hour  Intake 1692.5 ml  Output 1850 ml  Net -157.5 ml   Filed Weights   01/26/23 2000  Weight: 96.2 kg    Examination:  General exam: Appears calm and comfortable  Respiratory system: Clear to auscultation. Respiratory effort normal. Cardiovascular system: S1 & S2 heard, RRR. No JVD, murmurs, rubs, gallops or clicks. No pedal edema. Gastrointestinal system: Abdomen is nondistended, soft and nontender. No organomegaly or masses felt. Normal bowel sounds heard. Central nervous system: Alert and oriented. No focal neurological deficits. Extremities: Symmetric 5 x 5 power. Skin: No rashes, lesions or ulcers Psychiatry: Judgement and insight appear normal. Mood & affect appropriate.     Data Reviewed: I have personally reviewed following labs and imaging studies  CBC: Recent Labs  Lab 01/26/23 1344 01/27/23 0510 01/28/23 0458 01/29/23 0502  WBC 14.3* 11.6* 8.0 9.1  NEUTROABS 10.7*  --   --   --   HGB 11.8* 9.9* 10.1* 10.1*  HCT 38.9* 30.8* 32.3* 33.0*  MCV 78.0* 74.2* 74.8* 76.2*  PLT 483* 459* 453* 497*   Basic Metabolic Panel: Recent Labs  Lab 01/26/23 1559 01/27/23 0510 01/28/23 0458 01/29/23 0502 01/30/23 0509 01/31/23 0429 02/01/23 0334  NA  --  137 140 137 137 140  --   K  --  2.9* 3.7 3.6 3.2* 3.8  --   CL  --  99 106 103 101 104  --   CO2  --  28 26 26 28 29   --   GLUCOSE  --  97 90 93 93 92  --   BUN  --  10 7* 7* 6* 7*  --   CREATININE  --  0.53* 0.46* 0.46* 0.43* 0.51* 0.53*  CALCIUM  --  8.3* 8.2* 8.1* 8.1* 8.2*  --   MG 2.6*  --   --   --   --  2.2  --   PHOS 2.7  --   --   --   --   --   --    GFR: Estimated Creatinine Clearance: 85.9 mL/min (A) (by C-G formula based on SCr of 0.53 mg/dL (L)). Liver Function Tests: Recent Labs  Lab 01/26/23 1344  AST  21  ALT 13  ALKPHOS 80  BILITOT 1.1  PROT 8.6*  ALBUMIN 2.9*   No results for input(s): "LIPASE", "AMYLASE" in the last 168 hours. No results for input(s): "AMMONIA" in the last 168 hours. Coagulation Profile: Recent Labs  Lab 01/26/23 1559  INR 1.2    Sepsis Labs: Recent Labs  Lab 01/26/23 1559 01/26/23 2008 01/31/23 0429  PROCALCITON 0.18  --  <0.10  LATICACIDVEN 1.9 1.0  --     Recent Results (from the past 240 hour(s))  Blood culture (routine x 2)     Status: None   Collection Time: 01/26/23  3:45 PM   Specimen: BLOOD  Result Value Ref Range Status  Specimen Description BLOOD BLOOD RIGHT ARM  Final   Special Requests   Final    BOTTLES DRAWN AEROBIC AND ANAEROBIC Blood Culture adequate volume   Culture   Final    NO GROWTH 5 DAYS Performed at [facility], [address] [place], [place] [address]    Report Status 01/31/2023 FINAL  Final  SARS Coronavirus 2 by RT PCR (hospital order, performed in [facility] hospital lab) *cepheid single result test* Anterior Nasal Swab     Status: None   Collection Time: 01/26/23  3:59 PM   Specimen: Anterior Nasal Swab  Result Value Ref Range Status   SARS Coronavirus 2 by RT PCR NEGATIVE NEGATIVE Final    Comment: (NOTE) SARS-CoV-2 target nucleic acids are NOT DETECTED.  The SARS-CoV-2 RNA is generally detectable in upper and lower respiratory specimens during the acute phase of infection. The lowest concentration of SARS-CoV-2 viral copies this assay can detect is 250 copies / mL. A negative result does not preclude SARS-CoV-2 infection and should not be used as the sole basis for treatment or other patient management decisions.  A negative result may occur with improper specimen collection / handling, submission of specimen other than nasopharyngeal swab, presence of viral mutation(s) within the areas targeted by this assay, and inadequate number of viral copies (<250 copies / mL). A negative result must be  combined with clinical observations, patient history, and epidemiological information.  Fact Sheet for Patients:   RoadLapTop.co.za  Fact Sheet for Healthcare Providers: http://kim-miller.com/  This test is not yet approved or  cleared by the [place] FDA and has been authorized for detection and/or diagnosis of SARS-CoV-2 by FDA under an Emergency Use Authorization (EUA).  This EUA will remain in effect (meaning this test can be used) for the duration of the COVID-19 declaration under Section 564(b)(1) of the Act, 21 U.S.C. section 360bbb-3(b)(1), unless the authorization is terminated or revoked sooner.  Performed at [facility], [address] [place], [place] [address]   Blood culture (routine x 2)     Status: None   Collection Time: 01/26/23  3:59 PM   Specimen: BLOOD  Result Value Ref Range Status   Specimen Description BLOOD LEFT ANTECUBITAL  Final   Special Requests   Final    BOTTLES DRAWN AEROBIC AND ANAEROBIC Blood Culture adequate volume   Culture   Final    NO GROWTH 5 DAYS Performed at [facility], [address], [place], [place] [address]    Report Status 01/31/2023 FINAL  Final  Aerobic/Anaerobic Culture w Gram Stain (surgical/deep wound)     Status: None (Preliminary result)   Collection Time: 01/28/23  6:28 PM   Specimen: Path Tissue  Result Value Ref Range Status   Specimen Description   Final    TISSUE Performed at [facility], [address] [place], [place] [address]    Special Requests ISCHIAL WOUND TISSUE  Final   Gram Stain   Final    RARE WBC SEEN ABUNDANT GRAM POSITIVE COCCI ABUNDANT GRAM NEGATIVE RODS FEW GRAM POSITIVE RODS Performed at [facility], [address], [place], [place] [address]    Culture   Final    RARE PROTEUS MIRABILIS SUSCEPTIBILITIES PERFORMED ON PREVIOUS CULTURE WITHIN THE LAST 5 DAYS. FEW DIPHTHEROIDS(CORYNEBACTERIUM  SPECIES) Standardized susceptibility testing for this organism is not available. CULTURE REINCUBATED FOR BETTER GROWTH NO ANAEROBES ISOLATED; CULTURE IN PROGRESS FOR 5 DAYS    Report Status PENDING  Incomplete  Aerobic/Anaerobic Culture w Gram Stain (surgical/deep wound)     Status: None (Preliminary result)   Collection Time: 01/28/23  6:37 PM   Specimen: PATH Bone resection; Tissue  Result Value Ref Range Status   Specimen Description   Final    TISSUE Performed at [name], [address], [place], [place] [address]    Special Requests   Final    BONE RESECT Performed at [facility], [address] [place], [place] [address]    Gram Stain   Final    RARE WBC SEEN FEW GRAM POSITIVE COCCI FEW GRAM NEGATIVE RODS Performed at [facility], [address] [place], [place] [address]    Culture   Final    RARE PROTEUS MIRABILIS CULTURE REINCUBATED FOR BETTER GROWTH NO ANAEROBES ISOLATED; CULTURE IN PROGRESS FOR 5 DAYS    Report Status PENDING  Incomplete   Organism ID, Bacteria PROTEUS MIRABILIS  Final      Susceptibility   Proteus mirabilis - MIC*    AMPICILLIN >=32 RESISTANT Resistant     CEFEPIME <=0.12 SENSITIVE Sensitive     CEFTAZIDIME <=1 SENSITIVE Sensitive     CEFTRIAXONE <=0.25 SENSITIVE Sensitive     CIPROFLOXACIN <=0.25 SENSITIVE Sensitive     GENTAMICIN <=1 SENSITIVE Sensitive     IMIPENEM 2 SENSITIVE Sensitive     TRIMETH/SULFA <=20 SENSITIVE Sensitive     AMPICILLIN/SULBACTAM >=32 RESISTANT Resistant     PIP/TAZO <=4 SENSITIVE Sensitive     * RARE PROTEUS MIRABILIS         Radiology Studies: No results found.      Scheduled Meds:  collagenase   Topical Daily   enoxaparin (LOVENOX) injection  40 mg Subcutaneous Q24H   ferrous sulfate  325 mg Oral BID WC   Continuous Infusions:  cefTRIAXone (ROCEPHIN)  IV 2 g (02/01/23 1632)     LOS: 6 days    Time spent: 25 minutes    Brieanne Mignone Sherryll Burger, MD [facility] Pager [phone]  If 7PM-7AM, please contact night-coverage [email or web address] 02/01/2023, 8:56 PM

## 2023-02-02 DIAGNOSIS — N319 Neuromuscular dysfunction of bladder, unspecified: Secondary | ICD-10-CM

## 2023-02-02 DIAGNOSIS — A419 Sepsis, unspecified organism: Secondary | ICD-10-CM | POA: Diagnosis not present

## 2023-02-02 DIAGNOSIS — Z515 Encounter for palliative care: Secondary | ICD-10-CM | POA: Diagnosis not present

## 2023-02-02 DIAGNOSIS — D508 Other iron deficiency anemias: Secondary | ICD-10-CM

## 2023-02-02 DIAGNOSIS — E876 Hypokalemia: Secondary | ICD-10-CM | POA: Diagnosis not present

## 2023-02-02 DIAGNOSIS — L89154 Pressure ulcer of sacral region, stage 4: Secondary | ICD-10-CM | POA: Diagnosis not present

## 2023-02-02 DIAGNOSIS — G8220 Paraplegia, unspecified: Secondary | ICD-10-CM | POA: Diagnosis not present

## 2023-02-02 DIAGNOSIS — Z87828 Personal history of other (healed) physical injury and trauma: Secondary | ICD-10-CM | POA: Diagnosis not present

## 2023-02-02 DIAGNOSIS — M86152 Other acute osteomyelitis, left femur: Secondary | ICD-10-CM | POA: Diagnosis not present

## 2023-02-02 LAB — CBC
HCT: 32.2 % — ABNORMAL LOW (ref 39.0–52.0)
Hemoglobin: 9.9 g/dL — ABNORMAL LOW (ref 13.0–17.0)
MCH: 23.7 pg — ABNORMAL LOW (ref 26.0–34.0)
MCHC: 30.7 g/dL (ref 30.0–36.0)
MCV: 77 fL — ABNORMAL LOW (ref 80.0–100.0)
Platelets: 469 10*3/uL — ABNORMAL HIGH (ref 150–400)
RBC: 4.18 MIL/uL — ABNORMAL LOW (ref 4.22–5.81)
RDW: 15.9 % — ABNORMAL HIGH (ref 11.5–15.5)
WBC: 8.5 10*3/uL (ref 4.0–10.5)
nRBC: 0 % (ref 0.0–0.2)

## 2023-02-02 LAB — BASIC METABOLIC PANEL
Anion gap: 9 (ref 5–15)
BUN: 10 mg/dL (ref 8–23)
CO2: 28 mmol/L (ref 22–32)
Calcium: 8.4 mg/dL — ABNORMAL LOW (ref 8.9–10.3)
Chloride: 103 mmol/L (ref 98–111)
Creatinine, Ser: 0.47 mg/dL — ABNORMAL LOW (ref 0.61–1.24)
GFR, Estimated: >60 mL/min (ref >60)
Glucose, Bld: 86 mg/dL (ref 70–99)
Potassium: 4 mmol/L (ref 3.5–5.1)
Sodium: 140 mmol/L (ref 135–145)

## 2023-02-02 NOTE — Progress Notes (Signed)
Date of Admission:  01/26/2023    ID: Bradley [name] is a 77 y.o. male Principal Problem:   Osteomyelitis of pelvic region or thigh, acute, left (HCC) Active Problems:   Iron deficiency anemia   Sacral decubitus ulcer   Paraplegia (HCC)   Neurogenic bladder   Hx of spinal cord injury   Sepsis (HCC)   HTN (hypertension)   Hypokalemia  77 y.o. with a history of Hypertension, T7 spinal cord injury with paraplegia, neurogenic bladder, BPH iron deficiency anemia, hyperlipidemia, status post colostomy presented to the ED on 01/26/2023 with fever and chills and worsening sacral wound.  Has had sacral wound for a long time.  He noted recently a foul odor from the wound.  He developed fever and chills in the past few days Temperature in the ED was 101.4 Underwent debridement by surgeon on 8/24 Wa son meropenem and vanco Now on ceftriaxone  Subjective: Feeling better No fever in many days  Medications:   enoxaparin (LOVENOX) injection  40 mg Subcutaneous Q24H   ferrous sulfate  325 mg Oral BID WC    Objective: Vital signs in last 24 hours: Patient Vitals for the past 24 hrs:  BP Temp Temp src Pulse Resp SpO2  02/02/23 1729 (!) 133/56 98.1 F (36.7 C) Oral 70 17 100 %  02/02/23 0859 (!) 114/50 97.6 F (36.4 C) Oral 68 18 100 %  02/01/23 2322 130/64 98.6 F (37 C) Oral 76 18 99 %     PHYSICAL EXAM:  General: Alert, cooperative, no distress, appears stated age.  Lungs: Clear to auscultation bilaterally. No Wheezing or Rhonchi. No rales. Heart: Regular rate and rhythm, no murmur, rub or gallop. Abdomen: Soft, non-tender,not distended.colostomy Wound    Skin: No rashes or lesions. Or bruising Lymph: Cervical, supraclavicular normal. Neurologic: paraplegia  Lab Results    Latest Ref Rng & Units 02/02/2023    3:44 AM 01/29/2023    5:02 AM 01/28/2023    4:58 AM  CBC  WBC 4.0 - 10.5 K/uL 8.5  9.1  8.0   Hemoglobin 13.0 - 17.0 g/dL 9.9  08.6  57.8   Hematocrit 39.0 - 52.0 %  32.2  33.0  32.3   Platelets 150 - 400 K/uL 469  497  453        Latest Ref Rng & Units 02/02/2023    3:44 AM 02/01/2023    3:34 AM 01/31/2023    4:29 AM  CMP  Glucose 70 - 99 mg/dL 86   92   BUN 8 - 23 mg/dL 10   7   Creatinine 4.69 - 1.24 mg/dL 6.29  5.28  4.13   Sodium 135 - 145 mmol/L 140   140   Potassium 3.5 - 5.1 mmol/L 4.0   3.8   Chloride 98 - 111 mmol/L 103   104   CO2 22 - 32 mmol/L 28   29   Calcium 8.9 - 10.3 mg/dL 8.4   8.2       Microbiology: Proteus in culture  Assessment/Plan: Chronic left ischial decubitus ulcer with underlying bone involvement Chronic sacral wound S/p debridement of the left ischial wound- culture of bone proteus- bone not sent for pathology As per Dr.Sakai the wound looks good and he agrees with oral antibiotic on discharge Was on meropenem and vanco- changed to ceftriaxone On discharge we can do Levaquin 750mg  PO every day for upto 4 weeks. Please give a 2 week prescription on discharge. Will need labs in  2 weeks  HE will follow with wound clinic and I will follow as well   Paraplegia   Neurogenic bladder- [facility]   Anemia   Discussed the management with patient in detail

## 2023-02-02 NOTE — Plan of Care (Signed)
  Problem: Education: Goal: Knowledge of General Education information will improve Description: Including pain rating scale, medication(s)/side effects and non-pharmacologic comfort measures Outcome: Progressing   Problem: Health Behavior/Discharge Planning: Goal: Ability to manage health-related needs will improve Outcome: Progressing   Problem: Clinical Measurements: Goal: Ability to maintain clinical measurements within normal limits will improve Outcome: Progressing Goal: Cardiovascular complication will be avoided Outcome: Progressing   Problem: Activity: Goal: Risk for activity intolerance will decrease Outcome: Progressing   Problem: Nutrition: Goal: Adequate nutrition will be maintained Outcome: Progressing   Problem: Coping: Goal: Level of anxiety will decrease Outcome: Progressing   Problem: Elimination: Goal: Will not experience complications related to bowel motility Outcome: Progressing Goal: Will not experience complications related to urinary retention Outcome: Progressing   Problem: Pain Managment: Goal: General experience of comfort will improve Outcome: Progressing   Problem: Safety: Goal: Ability to remain free from injury will improve Outcome: Progressing   Problem: Skin Integrity: Goal: Risk for impaired skin integrity will decrease Outcome: Progressing

## 2023-02-02 NOTE — Plan of Care (Signed)
  Problem: Clinical Measurements: Goal: Diagnostic test results will improve Outcome: Progressing Goal: Respiratory complications will improve Outcome: Progressing Goal: Cardiovascular complication will be avoided Outcome: Progressing   Problem: Activity: Goal: Risk for activity intolerance will decrease Outcome: Progressing   Problem: Nutrition: Goal: Adequate nutrition will be maintained Outcome: Progressing   Problem: Skin Integrity: Goal: Risk for impaired skin integrity will decrease Outcome: Progressing

## 2023-02-02 NOTE — Progress Notes (Addendum)
PROGRESS NOTE    [name]  QMV:[id number] DOB: June 27, 1945 DOA: 01/26/2023 PCP: Marisue Ivan, MD    Brief Narrative:   Bradley [name] is a 77 y.o. male with medical history significant of paraplegia due to spinal cord injury, neurogenic bladder (using condom catheter), s/p of ostomy, hypertension, iron deficiency anemia presented to hospital with fever chills and worsening of his sacral ulceration.  Patient does have a history of chronic sacral wounds and recently noted foul odor from his left lower buttocks area. In the ED, pt met sepsis criteria with fever 101.4 F, HR 112 and RR 25.  Lactic acid was normal.  WBC was 14.3k.CT scan of pelvis showed findings concerning for osteomyelitis at the left ischium, chronic erosive changes of sacrococcygeal region. Pt was then admitted to hospital and started on empiric IV antibiotics with vancomycin and meropenem.     8/24: Left ischial pressure wound debridement 8/25-8/28: Wound care, ID consult, waiting for culture sensitivity  Assessment & Plan:   Principal Problem:   Osteomyelitis of pelvic region or thigh, acute, left (HCC) Active Problems:   Sepsis (HCC)   Neurogenic bladder   HTN (hypertension)   Hypokalemia   Iron deficiency anemia   Paraplegia (HCC)   Hx of spinal cord injury   Sacral decubitus ulcer    Sepsis. Left ischium osteomyelitis Hx of sacral osteomyelitis Sacral decubitus ulcer/left ischium decubitus ulcer General Surgery was consulted and underwent debridement of the left ischial wound and bone biopsy on 01/28/2023.  ID on board.  Currently on IV Rocephin.  Following up with culture and sensitivity.  Likely oral Levaquin on discharge.  Continue frequent repositioning.  Wound care.   Neurogenic bladder External catheter in place.   HTN (hypertension) On as needed hydralazine.  Hygroton on hold.  Blood pressure is stable at this time.   Hypokalemia Improved after replacement.  Latest potassium of 4.0.    Iron deficiency anemia Hemoglobin 11.8 on admission, latest hemoglobin at 9.9.  No active bleeding.  Continue iron.   Hx of spinal cord injury Neurogenic bladder Paraplegia Wheelchair-bound at baseline.  Continue Proscar.    DVT prophylaxis:  enoxaparin (LOVENOX) injection 40 mg Start: 01/29/23 0800 SCD's Start: 01/28/23 2329     Code Status: Full code  Family Communication: None at bedside.  I tried to reach out to the patient's son on the phone but was unable to reach him today.  Disposition Plan: Home with home health.  At baseline he primarily stays in the wheelchair   Consultants:  Surgery ID  Procedures:  Debridement of left ischial pressure wound on 01/28/23  Antimicrobials:  IV Rocephin   Subjective: Patient states that he feels okay.  Denies any fever chills and nausea vomiting.  No shortness of breath or chest pain.  Objective: Vitals:   02/01/23 0852 02/01/23 1625 02/01/23 2322 02/02/23 0859  BP: (!) 117/56 125/66 130/64 (!) 114/50  Pulse: 88 79 76 68  Resp: 18 18 18 18   Temp: 97.8 F (36.6 C) 98.3 F (36.8 C) 98.6 F (37 C) 97.6 F (36.4 C)  TempSrc:  Oral Oral   SpO2: 100% 99% 99% 100%  Weight:      Height:        Intake/Output Summary (Last 24 hours) at 02/02/2023 0921 Last data filed at 02/02/2023 2841 Gross per 24 hour  Intake 1710.07 ml  Output 1250 ml  Net 460.07 ml   Filed Weights   01/26/23 2000  Weight: 96.2 kg  Physical examination:  General:  Average built, not in obvious distress HENT:   No scleral pallor or icterus noted. Oral mucosa is moist.  Chest:  Clear breath sounds.  Diminished breath sounds bilaterally. No crackles or wheezes.  CVS: S1 &S2 heard. No murmur.  Regular rate and rhythm. Abdomen: Soft, nontender, nondistended.  Bowel sounds are heard.  Left upper extremity colostomy in place.  External urinary catheter. Extremities: Paraplegia noted. Psych: Alert, awake and oriented, normal mood CNS:  No cranial  nerve deficits.  Power equal in all extremities.   Skin: Warm and dry, sacral ulceration with dressing.  Data Reviewed: I have personally reviewed following labs and imaging studies  CBC: Recent Labs  Lab 01/26/23 1344 01/27/23 0510 01/28/23 0458 01/29/23 0502 02/02/23 0344  WBC 14.3* 11.6* 8.0 9.1 8.5  NEUTROABS 10.7*  --   --   --   --   HGB 11.8* 9.9* 10.1* 10.1* 9.9*  HCT 38.9* 30.8* 32.3* 33.0* 32.2*  MCV 78.0* 74.2* 74.8* 76.2* 77.0*  PLT 483* 459* 453* 497* 469*   Basic Metabolic Panel: Recent Labs  Lab 01/26/23 1559 01/27/23 0510 01/28/23 0458 01/29/23 0502 01/30/23 0509 01/31/23 0429 02/01/23 0334 02/02/23 0344  NA  --    < > 140 137 137 140  --  140  K  --    < > 3.7 3.6 3.2* 3.8  --  4.0  CL  --    < > 106 103 101 104  --  103  CO2  --    < > 26 26 28 29   --  28  GLUCOSE  --    < > 90 93 93 92  --  86  BUN  --    < > 7* 7* 6* 7*  --  10  CREATININE  --    < > 0.46* 0.46* 0.43* 0.51* 0.53* 0.47*  CALCIUM  --    < > 8.2* 8.1* 8.1* 8.2*  --  8.4*  MG 2.6*  --   --   --   --  2.2  --   --   PHOS 2.7  --   --   --   --   --   --   --    < > = values in this interval not displayed.   GFR: Estimated Creatinine Clearance: 85.9 mL/min (A) (by C-G formula based on SCr of 0.47 mg/dL (L)). Liver Function Tests: Recent Labs  Lab 01/26/23 1344  AST 21  ALT 13  ALKPHOS 80  BILITOT 1.1  PROT 8.6*  ALBUMIN 2.9*   No results for input(s): "LIPASE", "AMYLASE" in the last 168 hours. No results for input(s): "AMMONIA" in the last 168 hours. Coagulation Profile: Recent Labs  Lab 01/26/23 1559  INR 1.2    Sepsis Labs: Recent Labs  Lab 01/26/23 1559 01/26/23 2008 01/31/23 0429  PROCALCITON 0.18  --  <0.10  LATICACIDVEN 1.9 1.0  --     Recent Results (from the past 240 hour(s))  Blood culture (routine x 2)     Status: None   Collection Time: 01/26/23  3:45 PM   Specimen: BLOOD  Result Value Ref Range Status   Specimen Description BLOOD BLOOD RIGHT ARM   Final   Special Requests   Final    BOTTLES DRAWN AEROBIC AND ANAEROBIC Blood Culture adequate volume   Culture   Final    NO GROWTH 5 DAYS Performed at [facility], [address] [place],  [place] [address]    Report Status 01/31/2023 FINAL  Final  SARS Coronavirus 2 by RT PCR (hospital order, performed in [facility] hospital lab) *cepheid single result test* Anterior Nasal Swab     Status: None   Collection Time: 01/26/23  3:59 PM   Specimen: Anterior Nasal Swab  Result Value Ref Range Status   SARS Coronavirus 2 by RT PCR NEGATIVE NEGATIVE Final    Comment: (NOTE) SARS-CoV-2 target nucleic acids are NOT DETECTED.  The SARS-CoV-2 RNA is generally detectable in upper and lower respiratory specimens during the acute phase of infection. The lowest concentration of SARS-CoV-2 viral copies this assay can detect is 250 copies / mL. A negative result does not preclude SARS-CoV-2 infection and should not be used as the sole basis for treatment or other patient management decisions.  A negative result may occur with improper specimen collection / handling, submission of specimen other than nasopharyngeal swab, presence of viral mutation(s) within the areas targeted by this assay, and inadequate number of viral copies (<250 copies / mL). A negative result must be combined with clinical observations, patient history, and epidemiological information.  Fact Sheet for Patients:   RoadLapTop.co.za  Fact Sheet for Healthcare Providers: http://kim-miller.com/  This test is not yet approved or  cleared by the [place] FDA and has been authorized for detection and/or diagnosis of SARS-CoV-2 by FDA under an Emergency Use Authorization (EUA).  This EUA will remain in effect (meaning this test can be used) for the duration of the COVID-19 declaration under Section 564(b)(1) of the Act, 21 U.S.C. section 360bbb-3(b)(1), unless the  authorization is terminated or revoked sooner.  Performed at [facility], [address] [place], [place] [address]   Blood culture (routine x 2)     Status: None   Collection Time: 01/26/23  3:59 PM   Specimen: BLOOD  Result Value Ref Range Status   Specimen Description BLOOD LEFT ANTECUBITAL  Final   Special Requests   Final    BOTTLES DRAWN AEROBIC AND ANAEROBIC Blood Culture adequate volume   Culture   Final    NO GROWTH 5 DAYS Performed at [facility], [address] [place], [place] [address]    Report Status 01/31/2023 FINAL  Final  Aerobic/Anaerobic Culture w Gram Stain (surgical/deep wound)     Status: None (Preliminary result)   Collection Time: 01/28/23  6:28 PM   Specimen: Path Tissue  Result Value Ref Range Status   Specimen Description   Final    TISSUE Performed at [facility], [address], [place], [place] [address]    Special Requests ISCHIAL WOUND TISSUE  Final   Gram Stain   Final    RARE WBC SEEN ABUNDANT GRAM POSITIVE COCCI ABUNDANT GRAM NEGATIVE RODS FEW GRAM POSITIVE RODS Performed at [facility], [address], [place], [place] [address]    Culture   Final    RARE PROTEUS MIRABILIS SUSCEPTIBILITIES PERFORMED ON PREVIOUS CULTURE WITHIN THE LAST 5 DAYS. FEW DIPHTHEROIDS(CORYNEBACTERIUM SPECIES) Standardized susceptibility testing for this organism is not available. CULTURE REINCUBATED FOR BETTER GROWTH NO ANAEROBES ISOLATED; CULTURE IN PROGRESS FOR 5 DAYS    Report Status PENDING  Incomplete  Aerobic/Anaerobic Culture w Gram Stain (surgical/deep wound)     Status: None (Preliminary result)   Collection Time: 01/28/23  6:37 PM   Specimen: PATH Bone resection; Tissue  Result Value Ref Range Status   Specimen Description   Final    TISSUE Performed  at [facility], [address], [place], [place] [address]    Special Requests   Final    BONE RESECT Performed at [facility], [address] [place], [place] [address]    Gram Stain   Final    RARE WBC SEEN FEW GRAM POSITIVE COCCI FEW GRAM NEGATIVE RODS Performed at [facility], [address] [place], [place] [address]    Culture   Final    RARE PROTEUS MIRABILIS CULTURE REINCUBATED FOR BETTER GROWTH NO ANAEROBES ISOLATED; CULTURE IN PROGRESS FOR 5 DAYS    Report Status PENDING  Incomplete   Organism ID, Bacteria PROTEUS MIRABILIS  Final      Susceptibility   Proteus mirabilis - MIC*    AMPICILLIN >=32 RESISTANT Resistant     CEFEPIME <=0.12 SENSITIVE Sensitive     CEFTAZIDIME <=1 SENSITIVE Sensitive     CEFTRIAXONE <=0.25 SENSITIVE Sensitive     CIPROFLOXACIN <=0.25 SENSITIVE Sensitive     GENTAMICIN <=1 SENSITIVE Sensitive     IMIPENEM 2 SENSITIVE Sensitive     TRIMETH/SULFA <=20 SENSITIVE Sensitive     AMPICILLIN/SULBACTAM >=32 RESISTANT Resistant     PIP/TAZO <=4 SENSITIVE Sensitive     * RARE PROTEUS MIRABILIS       Radiology Studies: No results found.      Scheduled Meds:  enoxaparin (LOVENOX) injection  40 mg Subcutaneous Q24H   ferrous sulfate  325 mg Oral BID WC   Continuous Infusions:  cefTRIAXone (ROCEPHIN)  IV Stopped (02/01/23 1705)     LOS: 7 days     Joycelyn Das, MD [facility] If 7PM-7AM, please contact night-coverage [email or web address] 02/02/2023, 9:21 AM

## 2023-02-02 NOTE — Care Management Important Message (Signed)
Important Message  Patient Details  Name: Bradley [name] MRN: [id number] Date of Birth: 1945-08-17   Medicare Important Message Given:  Yes  Patient is in an isolation room so I reviewed his Important Message from Medicare with him by phone [phone]). He stated he understood his rights and I thanked him for his time and wished him a speedy recovery.   Olegario Messier A Teyah Rossy 02/02/2023, 9:53 AM
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
## 2023-02-03 DIAGNOSIS — M86152 Other acute osteomyelitis, left femur: Secondary | ICD-10-CM | POA: Diagnosis not present

## 2023-02-03 DIAGNOSIS — A419 Sepsis, unspecified organism: Secondary | ICD-10-CM | POA: Diagnosis not present

## 2023-02-03 DIAGNOSIS — N319 Neuromuscular dysfunction of bladder, unspecified: Secondary | ICD-10-CM | POA: Diagnosis not present

## 2023-02-03 DIAGNOSIS — E876 Hypokalemia: Secondary | ICD-10-CM | POA: Diagnosis not present

## 2023-02-03 LAB — AEROBIC/ANAEROBIC CULTURE W GRAM STAIN (SURGICAL/DEEP WOUND)

## 2023-02-03 LAB — SEDIMENTATION RATE: Sed Rate: 78 mm/h — ABNORMAL HIGH (ref 0–20)

## 2023-02-03 LAB — C-REACTIVE PROTEIN: CRP: 4.6 mg/dL — ABNORMAL HIGH (ref <1.0)

## 2023-02-03 MED ORDER — LEVOFLOXACIN 750 MG PO TABS: 750.0000 mg | ORAL_TABLET | Freq: Every day | ORAL | 0 refills | Status: AC

## 2023-02-03 NOTE — Plan of Care (Signed)

## 2023-02-03 NOTE — Plan of Care (Signed)
  Problem: Education: Goal: Knowledge of General Education information will improve Description: Including pain rating scale, medication(s)/side effects and non-pharmacologic comfort measures Outcome: Progressing   Problem: Health Behavior/Discharge Planning: Goal: Ability to manage health-related needs will improve Outcome: Progressing   Problem: Nutrition: Goal: Adequate nutrition will be maintained Outcome: Progressing   Problem: Elimination: Goal: Will not experience complications related to bowel motility Outcome: Progressing Goal: Will not experience complications related to urinary retention Outcome: Progressing   Problem: Pain Managment: Goal: General experience of comfort will improve Outcome: Progressing   

## 2023-02-03 NOTE — Discharge Summary (Signed)
Physician Discharge Summary  Bradley [name]:[id number] DOB: [birth year] DOA: 01/26/2023  PCP: Marisue Ivan, MD  Admit date: 01/26/2023 Discharge date: 02/03/2023  Admitted From: Home  Discharge disposition: Home with home health  Recommendations for Outpatient Follow-Up:   Follow up with your primary care provider in one week.  Check CBC, BMP, magnesium in the next visit Follow-up with wound care clinic as scheduled on 02/13/2023.  Infectious disease to follow-up at that time as well.   Discharge Diagnosis:   Principal Problem:   Osteomyelitis of pelvic region or thigh, acute, left (HCC) Active Problems:   Sepsis (HCC)   Neurogenic bladder   HTN (hypertension)   Hypokalemia   Iron deficiency anemia   Paraplegia (HCC)   Hx of spinal cord injury   Sacral decubitus ulcer   Discharge Condition: Improved.  Diet recommendation:   Regular.  Wound care: Sacral decubitus ulcer care.  See below for description  Code status: Full.   History of Present Illness:   Bradley [name] is a 77 y.o. male with medical history significant of paraplegia due to spinal cord injury, neurogenic bladder (using condom catheter), s/p of ostomy, hypertension, iron deficiency anemia presented to hospital with fever chills and worsening of his sacral ulceration.  Patient does have a history of chronic sacral wounds and recently noted foul odor from his left lower buttocks area. In the ED, pt met sepsis criteria with fever 101.4 F, HR 112 and RR 25.  Lactic acid was normal.  WBC was 14.3k.CT scan of pelvis showed findings concerning for osteomyelitis at the left ischium, chronic erosive changes of sacrococcygeal region. Pt was then admitted to hospital and started on empiric IV antibiotics with vancomycin and meropenem.    Hospital Course:   Following conditions were addressed during hospitalization as listed below,  Sepsis secondary to Left ischium osteomyelitis Hx of sacral  osteomyelitis Sacral decubitus ulcer/left ischium decubitus ulcer General Surgery was consulted and underwent debridement of the left ischial wound and bone biopsy on 01/28/2023.  ID followed the patient during hospitalization.  Patient was subsequently on Rocephin and at this time ID has recommended Levaquin for total 4 weeks and ID to follow-up on 02/13/2023.  Levaquin prescription given for 2 weeks.  Debridement culture showed Proteus.  Neurogenic bladder Continue supportive care.  HTN (hypertension) Continue lisinopril- hydrochlorothiazide.  Hold off with chlorthalidone.   Hypokalemia Improved after replacement.  Latest potassium of 4.0.   Iron deficiency anemia Hemoglobin 11.8 on admission, latest hemoglobin at 9.9.  No active bleeding.  Continue iron.   Hx of spinal cord injury Neurogenic bladder Paraplegia Wheelchair-bound at baseline.  Continue Proscar.  Disposition.  At this time, patient is stable for disposition home with home health.  Patient will follow-up with wound care and infectious disease as outpatient.  Medical Consultants:   Infectious disease General Surgery  Procedures:    Debridement of left ischial pressure wound on 01/28/23 Subjective:   Today, patient was seen and examined at bedside denies any pain, nausea, vomiting, fever, chills or rigor.  Discharge Exam:   Vitals:   02/02/23 2306 02/03/23 0853  BP: 119/60 134/75  Pulse: 94 74  Resp: 18 14  Temp: 98.8 F (37.1 C) 97.7 F (36.5 C)  SpO2: 98% 100%   Vitals:   02/02/23 0859 02/02/23 1729 02/02/23 2306 02/03/23 0853  BP: (!) 114/50 (!) 133/56 119/60 134/75  Pulse: 68 70 94 74  Resp: 18 17 18 14   Temp: 97.6 F (36.4 C)  98.1 F (36.7 C) 98.8 F (37.1 C) 97.7 F (36.5 C)  TempSrc: Oral Oral    SpO2: 100% 100% 98% 100%  Weight:      Height:       Body mass index is 27.6 kg/m.  General: Alert awake, not in obvious distress HENT: pupils equally reacting to light,  No scleral pallor or  icterus noted. Oral mucosa is moist.  Chest:    Diminished breath sounds bilaterally. No crackles or wheezes.  CVS: S1 &S2 heard. No murmur.  Regular rate and rhythm. Abdomen: Soft, nontender, nondistended.  Bowel sounds are heard.   Extremities: Moves upper extremities.  Paraplegic. Colostomy in place. Psych: Alert, awake and oriented, normal mood CNS:  No cranial nerve deficits.  Power equal in all extremities.   Skin: Warm and dry.  Sacral ulceration with dressing.  The results of significant diagnostics from this hospitalization (including imaging, microbiology, ancillary and laboratory) are listed below for reference.     Diagnostic Studies:   CT PELVIS W CONTRAST  Result Date: 01/26/2023 CLINICAL DATA:  Fever chills dark-colored urine worsening sacral wound EXAM: CT PELVIS WITH CONTRAST TECHNIQUE: Multidetector CT imaging of the pelvis was performed using the standard protocol following the bolus administration of intravenous contrast. RADIATION DOSE REDUCTION: This exam was performed according to the departmental dose-optimization program which includes automated exposure control, adjustment of the mA and/or kV according to patient size and/or use of iterative reconstruction technique. CONTRAST:  OMNIPAQUE IOHEXOL 300 MG/ML  SOLN COMPARISON:  CT 10/05/2021 FINDINGS: Urinary Tract:  Bladder is unremarkable. Bowel: Partially visualized left abdominal ostomy with large parastomal hernia containing mesenteric fat and bowel. No acute bowel wall thickening. Negative appendix. Vascular/Lymphatic: Moderate aortic atherosclerosis. No aneurysm. Mildly prominent left inguinal lymph nodes measuring up to 12 mm, likely reactive. Reproductive:  Negative prostate.  Penile prosthesis. Other:  Negative for pelvic effusion or free air. Musculoskeletal: Large chronic sacral decubitus ulcer with abnormal but chronic appearing soft tissue thickening at the sacrococcygeal region with chronic erosive changes.  Interim finding of large left low gluteal ulcer with extension of wound to the left ischial bone. Cortical erosive changes at the left ischium consistent with osteomyelitis. Small volume gas and fluid within the left ulcer tract consistent with acute infection. Surrounding moderate inflammation in the soft tissues. This appears contiguous with the large sacral decubitus ulcer superficially. IMPRESSION: 1. Interim finding of large left low gluteal ulcer with surrounding inflammation and deep extension of wound to the left ischial bone. Cortical erosive changes at the left ischium consistent with osteomyelitis. Small volume gas and fluid within the left ulcer tract consistent with acute infection. 2. Large chronic sacral decubitus ulcer with chronic erosive changes involving the sacrococcygeal region. 3. Partially visualized left abdominal ostomy with large parastomal hernia containing mesenteric fat and bowel. 4. Aortic atherosclerosis. Aortic Atherosclerosis (ICD10-I70.0). Electronically Signed   By: Jasmine Pang M.D.   On: 01/26/2023 17:41     Labs:   Basic Metabolic Panel: Recent Labs  Lab 01/28/23 0458 01/29/23 0502 01/30/23 0509 01/31/23 0429 02/01/23 0334 02/02/23 0344  NA 140 137 137 140  --  140  K 3.7 3.6 3.2* 3.8  --  4.0  CL 106 103 101 104  --  103  CO2 26 26 28 29   --  28  GLUCOSE 90 93 93 92  --  86  BUN 7* 7* 6* 7*  --  10  CREATININE 0.46* 0.46* 0.43* 0.51* 0.53* 0.47*  CALCIUM  8.2* 8.1* 8.1* 8.2*  --  8.4*  MG  --   --   --  2.2  --   --    GFR Estimated Creatinine Clearance: 85.9 mL/min (A) (by C-G formula based on SCr of 0.47 mg/dL (L)). Liver Function Tests: No results for input(s): "AST", "ALT", "ALKPHOS", "BILITOT", "PROT", "ALBUMIN" in the last 168 hours. No results for input(s): "LIPASE", "AMYLASE" in the last 168 hours. No results for input(s): "AMMONIA" in the last 168 hours. Coagulation profile No results for input(s): "INR", "PROTIME" in the last 168  hours.  CBC: Recent Labs  Lab 01/28/23 0458 01/29/23 0502 02/02/23 0344  WBC 8.0 9.1 8.5  HGB 10.1* 10.1* 9.9*  HCT 32.3* 33.0* 32.2*  MCV 74.8* 76.2* 77.0*  PLT 453* 497* 469*   Cardiac Enzymes: No results for input(s): "CKTOTAL", "CKMB", "CKMBINDEX", "TROPONINI" in the last 168 hours. BNP: Invalid input(s): "POCBNP" CBG: No results for input(s): "GLUCAP" in the last 168 hours. D-Dimer No results for input(s): "DDIMER" in the last 72 hours. Hgb A1c No results for input(s): "HGBA1C" in the last 72 hours. Lipid Profile No results for input(s): "CHOL", "HDL", "LDLCALC", "TRIG", "CHOLHDL", "LDLDIRECT" in the last 72 hours. Thyroid function studies No results for input(s): "TSH", "T4TOTAL", "T3FREE", "THYROIDAB" in the last 72 hours.  Invalid input(s): "FREET3" Anemia work up No results for input(s): "VITAMINB12", "FOLATE", "FERRITIN", "TIBC", "IRON", "RETICCTPCT" in the last 72 hours. Microbiology Recent Results (from the past 240 hour(s))  Blood culture (routine x 2)     Status: None   Collection Time: 01/26/23  3:45 PM   Specimen: BLOOD  Result Value Ref Range Status   Specimen Description BLOOD BLOOD RIGHT ARM  Final   Special Requests   Final    BOTTLES DRAWN AEROBIC AND ANAEROBIC Blood Culture adequate volume   Culture   Final    NO GROWTH 5 DAYS Performed at [facility], [address] [place], [place] [address]    Report Status 01/31/2023 FINAL  Final  SARS Coronavirus 2 by RT PCR (hospital order, performed in [facility] hospital lab) *cepheid single result test* Anterior Nasal Swab     Status: None   Collection Time: 01/26/23  3:59 PM   Specimen: Anterior Nasal Swab  Result Value Ref Range Status   SARS Coronavirus 2 by RT PCR NEGATIVE NEGATIVE Final    Comment: (NOTE) SARS-CoV-2 target nucleic acids are NOT DETECTED.  The SARS-CoV-2 RNA is generally detectable in upper and lower respiratory specimens during the acute phase of infection. The  lowest concentration of SARS-CoV-2 viral copies this assay can detect is 250 copies / mL. A negative result does not preclude SARS-CoV-2 infection and should not be used as the sole basis for treatment or other patient management decisions.  A negative result may occur with improper specimen collection / handling, submission of specimen other than nasopharyngeal swab, presence of viral mutation(s) within the areas targeted by this assay, and inadequate number of viral copies (<250 copies / mL). A negative result must be combined with clinical observations, patient history, and epidemiological information.  Fact Sheet for Patients:   RoadLapTop.co.za  Fact Sheet for Healthcare Providers: http://kim-miller.com/  This test is not yet approved or  cleared by the [place] FDA and has been authorized for detection and/or diagnosis of SARS-CoV-2 by FDA under an Emergency Use Authorization (EUA).  This EUA will remain in effect (meaning this test can be used) for the duration of the COVID-19 declaration under  Section 564(b)(1) of the Act, 21 U.S.C. section 360bbb-3(b)(1), unless the authorization is terminated or revoked sooner.  Performed at [facility], [address] [place], [place] [address]   Blood culture (routine x 2)     Status: None   Collection Time: 01/26/23  3:59 PM   Specimen: BLOOD  Result Value Ref Range Status   Specimen Description BLOOD LEFT ANTECUBITAL  Final   Special Requests   Final    BOTTLES DRAWN AEROBIC AND ANAEROBIC Blood Culture adequate volume   Culture   Final    NO GROWTH 5 DAYS Performed at [facility], [address], [place], [place] [address]    Report Status 01/31/2023 FINAL  Final  Aerobic/Anaerobic Culture w Gram Stain (surgical/deep wound)     Status: None   Collection Time: 01/28/23  6:28 PM   Specimen: Path Tissue  Result Value Ref Range Status   Specimen Description    Final    TISSUE Performed at [facility], [address] [place], [place] [address]    Special Requests ISCHIAL WOUND TISSUE  Final   Gram Stain   Final    RARE WBC SEEN ABUNDANT GRAM POSITIVE COCCI ABUNDANT GRAM NEGATIVE RODS FEW GRAM POSITIVE RODS    Culture   Final    RARE PROTEUS MIRABILIS SUSCEPTIBILITIES PERFORMED ON PREVIOUS CULTURE WITHIN THE LAST 5 DAYS. FEW DIPHTHEROIDS(CORYNEBACTERIUM SPECIES) MODERATE SCHAALIA ODONTOLYTICUS Standardized susceptibility testing for this organism is not available. NO ANAEROBES ISOLATED Performed at [facility], [address], [place], [place] [address]    Report Status 02/03/2023 FINAL  Final  Aerobic/Anaerobic Culture w Gram Stain (surgical/deep wound)     Status: None   Collection Time: 01/28/23  6:37 PM   Specimen: PATH Bone resection; Tissue  Result Value Ref Range Status   Specimen Description   Final    TISSUE Performed at [facility], [address] [place], [place] [address]    Special Requests   Final    BONE RESECT Performed at [facility], [address] [place], [place] [address]    Gram Stain   Final    RARE WBC SEEN FEW GRAM POSITIVE COCCI FEW GRAM NEGATIVE RODS    Culture   Final    RARE PROTEUS MIRABILIS MODERATE SCHAALIA ODONTOLYTICUS Standardized susceptibility testing for this organism is not available. NO ANAEROBES ISOLATED Performed at [facility], [address] [place], [place] [address]    Report Status 02/03/2023 FINAL  Final   Organism ID, Bacteria PROTEUS MIRABILIS  Final      Susceptibility   Proteus mirabilis - MIC*    AMPICILLIN >=32 RESISTANT Resistant     CEFEPIME <=0.12 SENSITIVE Sensitive     CEFTAZIDIME <=1 SENSITIVE Sensitive     CEFTRIAXONE <=0.25 SENSITIVE Sensitive     CIPROFLOXACIN <=0.25 SENSITIVE Sensitive     GENTAMICIN <=1 SENSITIVE Sensitive     IMIPENEM 2 SENSITIVE Sensitive     TRIMETH/SULFA <=20 SENSITIVE Sensitive      AMPICILLIN/SULBACTAM >=32 RESISTANT Resistant     PIP/TAZO <=4 SENSITIVE Sensitive     * RARE PROTEUS MIRABILIS     Discharge Instructions:   Discharge Instructions     Call MD for:  redness, tenderness, or signs of infection (pain, swelling, redness, odor or green/yellow discharge around incision site)   Complete by: As directed    Call MD for:  severe uncontrolled pain   Complete by: As directed  Call MD for:  temperature >100.4   Complete by: As directed    Diet - low sodium heart healthy   Complete by: As directed    Discharge instructions   Complete by: As directed    Follow-up with your primary care provider in 1 week.  Check blood work at that time.  Follow-up with infectious disease Dr Rivka Safer outpatient on the 02/13/23 during wound care visit..  Continue antibiotics as prescribed.  Seek medical attention for worsening symptoms.   Discharge wound care:   Complete by: As directed    Clean sacral wound with Vashe wound cleanser Hart Rochester [phone]), cover wound bed with silver hydrofiber Hart Rochester [phone]), fill in wound with dry gauze and cover with silicone foam or ABD pad whichever is preferred.   Increase activity slowly   Complete by: As directed       Allergies as of 02/03/2023   No Known Allergies      Medication List     STOP taking these medications    chlorthalidone 25 MG tablet Commonly known as: HYGROTON   finasteride 5 MG tablet Commonly known as: PROSCAR       TAKE these medications    acetaminophen 500 MG tablet Commonly known as: TYLENOL Take 1,000 mg by mouth 3 (three) times daily.   ascorbic acid 500 MG tablet Commonly known as: VITAMIN C Take 500 mg by mouth 2 (two) times daily.   ferrous sulfate 325 (65 FE) MG EC tablet Take 1 tablet (325 mg total) by mouth in the morning and at bedtime.   fluticasone 50 MCG/ACT nasal spray Commonly known as: FLONASE Place into the nose.   levofloxacin 750 MG tablet Commonly known as:  Levaquin Take 1 tablet (750 mg total) by mouth daily for 14 days.   lisinopril-hydrochlorothiazide 10-12.5 MG tablet Commonly known as: ZESTORETIC Take 1 tablet by mouth daily.   Multi-Vitamins Tabs Take 1 tablet by mouth daily.   mupirocin ointment 2 % Commonly known as: BACTROBAN Apply 1 Application topically at bedtime.   polyethylene glycol powder 17 GM/SCOOP powder Commonly known as: GLYCOLAX/MIRALAX Take 17 g by mouth daily as needed for mild constipation.               Discharge Care Instructions  (From admission, onward)           Start     Ordered   02/03/23 0000  Discharge wound care:       Comments: Clean sacral wound with Vashe wound cleanser Hart Rochester [phone]), cover wound bed with silver hydrofiber Hart Rochester [phone]), fill in wound with dry gauze and cover with silicone foam or ABD pad whichever is preferred.   02/03/23 1038            Follow-up Information     Marisue Ivan, MD. Go on 02/09/2023.   Specialty: Family Medicine Why: Appt @ 2:30 pm Contact information: [address] [place] [address] [phone]         Lynn Ito, MD Follow up in 2 week(s).   Specialty: Infectious Diseases Why: Follow-up of osteomyelitis. Contact information: [address] [place] [address] [phone]                  Time coordinating discharge: 39 minutes  Signed:  Cameron Katayama  [facility] 02/03/2023, 2:57 PM

## 2023-02-03 NOTE — TOC Progression Note (Signed)
Transition of Care [facility]) - Progression Note    Patient Details  Name: Bradley [name] MRN: [id number] Date of Birth: August 10, 1945  [facility] CM/SW Contact  Marlowe Sax, RN Phone Number: 02/03/2023, 9:49 AM  Clinical Narrative:     Will discharge we can do Levaquin 750mg  PO every day for upto 4 weeks. Please give a 2 week prescription on discharge. Will need labs in 2 weeks           HE will follow with wound clinic  Son helps at home Son provides transportation or uses [employer] Patient currently goes to wound care clinic, he said he is having problems with his bed, he said he got a letter about it and will take care of it when he gets home He has an electric wheelchair and needs a new cushion, I explained that he would need to contact his wheelchair company. He stated understanding, He stated he would get his son to come at [place] to transport him ho\me  Expected Discharge Plan:  (TBD) Barriers to Discharge: Continued Medical Work up  Expected Discharge Plan and Services   Discharge Planning Services: CM Consult   Living arrangements for the past 2 months: Single Family Home                 DME Arranged: N/A DME Agency: NA                   Social Determinants of Health (SDOH) Interventions SDOH Screenings   Food Insecurity: No Food Insecurity (01/27/2023)  Housing: Low Risk  (01/27/2023)  Transportation Needs: No Transportation Needs (01/27/2023)  Utilities: Not At Risk (01/27/2023)  Financial Resource Strain: Low Risk  (05/04/2022)   Received from [facility]  Tobacco Use: Medium Risk (01/27/2023)    Readmission Risk Interventions     No data to display

## 2023-02-08 ENCOUNTER — Other Ambulatory Visit: Payer: Self-pay

## 2023-02-08 DIAGNOSIS — S31000S Unspecified open wound of lower back and pelvis without penetration into retroperitoneum, sequela: Secondary | ICD-10-CM

## 2023-02-09 DIAGNOSIS — Z8739 Personal history of other diseases of the musculoskeletal system and connective tissue: Secondary | ICD-10-CM | POA: Diagnosis not present

## 2023-02-09 DIAGNOSIS — L89159 Pressure ulcer of sacral region, unspecified stage: Secondary | ICD-10-CM | POA: Diagnosis not present

## 2023-02-10 DIAGNOSIS — I10 Essential (primary) hypertension: Secondary | ICD-10-CM | POA: Diagnosis not present

## 2023-02-10 DIAGNOSIS — E785 Hyperlipidemia, unspecified: Secondary | ICD-10-CM | POA: Diagnosis not present

## 2023-02-10 DIAGNOSIS — D508 Other iron deficiency anemias: Secondary | ICD-10-CM | POA: Diagnosis not present

## 2023-02-11 DIAGNOSIS — L89223 Pressure ulcer of left hip, stage 3: Secondary | ICD-10-CM | POA: Diagnosis not present

## 2023-02-11 DIAGNOSIS — S24103S Unspecified injury at T7-T10 level of thoracic spinal cord, sequela: Secondary | ICD-10-CM | POA: Diagnosis not present

## 2023-02-11 DIAGNOSIS — M86152 Other acute osteomyelitis, left femur: Secondary | ICD-10-CM | POA: Diagnosis not present

## 2023-02-11 DIAGNOSIS — G8220 Paraplegia, unspecified: Secondary | ICD-10-CM | POA: Diagnosis not present

## 2023-02-11 DIAGNOSIS — L89893 Pressure ulcer of other site, stage 3: Secondary | ICD-10-CM | POA: Diagnosis not present

## 2023-02-11 DIAGNOSIS — A419 Sepsis, unspecified organism: Secondary | ICD-10-CM | POA: Diagnosis not present

## 2023-02-11 DIAGNOSIS — M8618 Other acute osteomyelitis, other site: Secondary | ICD-10-CM | POA: Diagnosis not present

## 2023-02-11 DIAGNOSIS — L89153 Pressure ulcer of sacral region, stage 3: Secondary | ICD-10-CM | POA: Diagnosis not present

## 2023-02-11 DIAGNOSIS — I10 Essential (primary) hypertension: Secondary | ICD-10-CM | POA: Diagnosis not present

## 2023-02-13 ENCOUNTER — Encounter: Payer: Medicare HMO | Attending: Physician Assistant | Admitting: Physician Assistant

## 2023-02-13 DIAGNOSIS — G8220 Paraplegia, unspecified: Secondary | ICD-10-CM | POA: Insufficient documentation

## 2023-02-13 DIAGNOSIS — L89894 Pressure ulcer of other site, stage 4: Secondary | ICD-10-CM | POA: Insufficient documentation

## 2023-02-13 DIAGNOSIS — L89890 Pressure ulcer of other site, unstageable: Secondary | ICD-10-CM | POA: Diagnosis not present

## 2023-02-13 DIAGNOSIS — L89154 Pressure ulcer of sacral region, stage 4: Secondary | ICD-10-CM | POA: Insufficient documentation

## 2023-02-13 DIAGNOSIS — M8668 Other chronic osteomyelitis, other site: Secondary | ICD-10-CM | POA: Insufficient documentation

## 2023-02-13 NOTE — Progress Notes (Signed)
SAMNANG, CAYSON ([id number]) [id number] Page 1 of 4 Visit Report for 02/13/2023 Chief Complaint Document Details Patient Name: Date of Service: Bradley [name] [name] 02/13/2023 12:45 PM Medical Record Number: [id number] Patient Account Number: [id number] Date of Birth/Sex: Treating RN: 11-Jul-1945 (77 y.o. [name]) Yevonne Pax Primary Care Provider: Marisue Ivan Other Clinician: Betha Loa Referring Provider: Treating Provider/Extender: Rebecka Apley in Treatment: 186 Information Obtained from: Patient Chief Complaint sacral ulcer, left posterior leg/thigh wound Electronic Signature(s) Signed: 02/13/2023 1:02:55 PM By: Allen Derry PA-C Entered By: Allen Derry on 02/13/2023 10:02:55 -------------------------------------------------------------------------------- Physician Orders Details Patient Name: Date of Service: Bradley [name] [name] 02/13/2023 12:45 PM Medical Record Number: [id number] Patient Account Number: [id number] Date of Birth/Sex: Treating RN: 10-18-1945 (77 y.o. Bradley [name] Primary Care Provider: Marisue Ivan Other Clinician: Betha Loa Referring Provider: Treating Provider/Extender: Rebecka Apley in Treatment: [phone] Verbal / Phone Orders: Yes Clinician: Yevonne Pax Read Back and Verified: Yes Diagnosis Coding ICD-10 Coding Code Description L89.154 Pressure ulcer of sacral region, stage 4 L89.892 Pressure ulcer of other site, stage 2 S81.802A Unspecified open wound, left lower leg, initial encounter M86.68 Other chronic osteomyelitis, other site G82.20 Paraplegia, unspecified Follow-up Appointments Return Appointment in 1 week. Home Health Bradley [name], Bradley [name] ([id number]) [id number] Page 2 of 4 Home Health Company: - [facility] for wound care. May utilize formulary equivalent dressing for wound treatment orders unless otherwise  specified. Home Health Nurse may visit PRN to address patients wound care needs. **Please direct any NON-WOUND related issues/requests for orders to patient's Primary Care Physician. **If current dressing causes regression in wound condition, may D/C ordered dressing product/s and apply Normal Saline Moist Dressing daily until next Wound Healing Center or Other MD appointment. **Notify Wound Healing Center of regression in wound condition at [phone]. Bathing/ Shower/ Hygiene May shower; gently cleanse wound with antibacterial soap, rinse and pat dry prior to dressing wounds Off-Loading Roho cushion for wheelchair Low air-loss mattress (Group 2) Turn and reposition every 2 hours Wound Treatment Wound #10 - Sacrum Wound Laterality: Midline Cleanser: Dakin 16 (oz) 0.25 1 x Per Day/30 Days Discharge Instructions: Use as directed. Prim Dressing: Gauze (Generic) 1 x Per Day/30 Days ary Discharge Instructions: As directed: moistened with Dakins Solution Secondary Dressing: Zetuvit Plus 4x4 (in/in) (Generic) 1 x Per Day/30 Days Secured With: Medipore T - 57M Medipore H Soft Cloth Surgical T ape ape, 2x2 (in/yd) (Generic) 1 x Per Day/30 Days Wound #14 - Upper Leg Wound Laterality: Left, Posterior Cleanser: Dakin 16 (oz) 0.25 1 x Per Day/30 Days Discharge Instructions: Use as directed. Prim Dressing: Gauze (Generic) 1 x Per Day/30 Days ary Discharge Instructions: As directed: moistened with Dakins Solution Secondary Dressing: Zetuvit Plus 4x4 (in/in) (Generic) 1 x Per Day/30 Days Secured With: Medipore T - 57M Medipore H Soft Cloth Surgical T ape ape, 2x2 (in/yd) (Generic) 1 x Per Day/30 Days Electronic Signature(s) Unsigned Entered ByBetha Loa on 02/13/2023 11:01:45 -------------------------------------------------------------------------------- Problem List Details Patient Name: Date of Service: Bradley [name] [name] 02/13/2023 12:45 PM Medical Record Number: [id number] Patient  Account Number: [id number] Date of Birth/Sex: Treating RN: 08-Dec-1945 (77 y.o. Bradley [name] Primary Care Provider: Marisue Ivan Other Clinician: Betha Loa Referring Provider: Treating Provider/Extender: Maxwell Caul Weeks in Treatment: 186 Active Problems ICD-10 Encounter Code Description Active Date MDM Diagnosis L89.154 Pressure ulcer of sacral region, stage 4 07/16/2019 No Yes Bradley [name], Bradley [name] ([id number]) [id number] Page 3 of  4 L89.892 Pressure ulcer of other site, stage 2 11/16/2022 No Yes S81.802A Unspecified open wound, left lower leg, initial encounter 11/16/2022 No Yes M86.68 Other chronic osteomyelitis, other site 07/16/2019 No Yes G82.20 Paraplegia, unspecified 07/16/2019 No Yes Inactive Problems ICD-10 Code Description Active Date Inactive Date 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 02/13/2023 Medical Record Number: [id number] Patient Account Number: [id number] Date of Birth/Sex: Treating RN: [birth year] (77 y.o. Bradley [name] Primary Care Provider: Marisue Ivan Other Clinician: Betha Loa Referring Provider: Treating Provider/Extender: Maxwell Caul Weeks in Treatment: 186 Diagnosis Coding ICD-10 Codes Code Description L89.154 Pressure ulcer of sacral region, stage 4 L89.892 Pressure ulcer of other site, stage 2 S81.802A Unspecified open wound, left lower leg, initial encounter M86.68 Other  chronic osteomyelitis, other site G82.20 Paraplegia, unspecified Facility Procedures Bradley [name], Bradley [name] ([id number]): CPT4 Code Description [id number] [phone] - WOUND CARE VISIT-LEV 4 EST PT [id number] Page 4 of 4: Modifier Quantity 1 Electronic Signature(s) Unsigned Entered By: Betha Loa on 02/13/2023 11:02:43 Signature(s): Date(s):

## 2023-02-15 DIAGNOSIS — I10 Essential (primary) hypertension: Secondary | ICD-10-CM | POA: Diagnosis not present

## 2023-02-15 DIAGNOSIS — A419 Sepsis, unspecified organism: Secondary | ICD-10-CM | POA: Diagnosis not present

## 2023-02-15 DIAGNOSIS — L89153 Pressure ulcer of sacral region, stage 3: Secondary | ICD-10-CM | POA: Diagnosis not present

## 2023-02-15 DIAGNOSIS — L89223 Pressure ulcer of left hip, stage 3: Secondary | ICD-10-CM | POA: Diagnosis not present

## 2023-02-15 DIAGNOSIS — L89893 Pressure ulcer of other site, stage 3: Secondary | ICD-10-CM | POA: Diagnosis not present

## 2023-02-15 DIAGNOSIS — G8220 Paraplegia, unspecified: Secondary | ICD-10-CM | POA: Diagnosis not present

## 2023-02-15 DIAGNOSIS — S24103S Unspecified injury at T7-T10 level of thoracic spinal cord, sequela: Secondary | ICD-10-CM | POA: Diagnosis not present

## 2023-02-15 DIAGNOSIS — M86152 Other acute osteomyelitis, left femur: Secondary | ICD-10-CM | POA: Diagnosis not present

## 2023-02-15 DIAGNOSIS — M8618 Other acute osteomyelitis, other site: Secondary | ICD-10-CM | POA: Diagnosis not present

## 2023-02-16 ENCOUNTER — Inpatient Hospital Stay: Payer: Medicare HMO | Admitting: Infectious Diseases

## 2023-02-17 DIAGNOSIS — D508 Other iron deficiency anemias: Secondary | ICD-10-CM | POA: Diagnosis not present

## 2023-02-17 DIAGNOSIS — I10 Essential (primary) hypertension: Secondary | ICD-10-CM | POA: Diagnosis not present

## 2023-02-17 DIAGNOSIS — Z8739 Personal history of other diseases of the musculoskeletal system and connective tissue: Secondary | ICD-10-CM | POA: Diagnosis not present

## 2023-02-17 NOTE — Progress Notes (Signed)
Wound Cleansing / Measurement []  - 0 Simple Wound Cleansing - one wound X- 2 5 Complex Wound Cleansing - multiple wounds X- 1 5 Wound Imaging (photographs - any number of wounds) []  - 0 Wound Tracing (instead of photographs) []  - 0 Simple Wound Measurement - one wound X- 2 5 Complex Wound Measurement - multiple wounds INTERVENTIONS - Wound Dressings []  - 0 Small Wound Dressing one or multiple wounds X- 2 15 Medium Wound Dressing one or multiple wounds []  - 0 Large Wound Dressing one or multiple wounds []  - 0 Application of Medications - topical []  - 0 Application of Medications - injection INTERVENTIONS - Miscellaneous []  - 0 External ear exam Bradley [name], Bradley [name] ([id number]) [id number] Page 3 of 10 []  - 0 Specimen Collection (cultures, biopsies, blood, body fluids, etc.) []  - 0 Specimen(s) / Culture(s) sent or taken to Lab for analysis X- 1 10 Patient Transfer (multiple staff / Michiel Sites Lift / Similar devices) []  - 0 Simple Staple / Suture removal (25 or less) []  - 0 Complex Staple / Suture removal (26 or more) []  - 0 Hypo / Hyperglycemic Management (close monitor of Blood Glucose) []  - 0 Ankle / Brachial Index (ABI) - do not check if billed separately X- 1 5 Vital Signs Has the patient been seen at the hospital within the last three years: Yes Total Score: 120 Level Of Care: New/Established - Level 4 Electronic Signature(s) Signed: 02/14/2023 4:31:19 PM By: Betha Loa Entered By: Betha Loa on 02/13/2023 14:02:37 -------------------------------------------------------------------------------- Encounter Discharge Information Details Patient Name: Date of Service: Bradley [name] [name] 02/13/2023  12:45 PM Medical Record Number: [id number] Patient Account Number: [id number] Date of Birth/Sex: Treating RN: [birth year] (77 y.o. [name] Primary Care Amilya Haver: Marisue Ivan Other Clinician: Betha Loa Referring Jaxton Casale: Treating Severus Brodzinski/Extender: Rebecka Apley in Treatment: 186 Encounter Discharge Information Items Discharge Condition: Stable Ambulatory Status: Wheelchair Discharge Destination: Home Transportation: Other Accompanied By: self Schedule Follow-up Appointment: Yes Clinical Summary of Care: Electronic Signature(s) Signed: 02/14/2023 4:31:19 PM By: Betha Loa Entered By: Betha Loa on 02/13/2023 16:49:41 Lower Extremity Assessment Details -------------------------------------------------------------------------------- Bradley [name] ([id number]) [id number] Page 4 of 10 Patient Name: Date of Service: Bradley [name] [name] 02/13/2023 12:45 PM Medical Record Number: [id number] Patient Account Number: [id number] Date of Birth/Sex: Treating RN: 02/14/1946 (77 y.o. [name]) Yevonne Pax Primary Care Brittanni Cariker: Marisue Ivan Other Clinician: Betha Loa Referring Ellias Mcelreath: Treating Markise Haymer/Extender: Maxwell Caul Weeks in Treatment: 186 Electronic Signature(s) Signed: 02/14/2023 4:31:19 PM By: Betha Loa Signed: 02/17/2023 1:15:19 PM By: Yevonne Pax RN Entered By: Betha Loa on 02/13/2023 13:21:13 -------------------------------------------------------------------------------- Multi Wound Chart Details Patient Name: Date of Service: Bradley [name] [name] 02/13/2023 12:45 PM Medical Record Number: [id number] Patient Account Number: [id number] Date of Birth/Sex: Treating RN: [birth year] (77 y.o. [name]) Yevonne Pax Primary Care Azizah Lisle: Marisue Ivan Other Clinician: Betha Loa Referring Carlette Palmatier: Treating Bedelia Pong/Extender: Rebecka Apley in  Treatment: 186 Vital Signs Height(in): Pulse(bpm): 77 Weight(lbs): Blood Pressure(mmHg): 107/65 Body Mass Index(BMI): Temperature(F): 97.7 Respiratory Rate(breaths/min): 18 [10:Photos:] [[name]:[name]] Midline Sacrum Left, Posterior Upper Leg [name] Wound Location: Pressure Injury Shear/Friction [name] Wounding Event: Pressure Ulcer Pressure Ulcer [name] Primary Etiology: Anemia, Hypertension, History of Anemia, Hypertension, History of [name] Comorbid History: pressure wounds, Rheumatoid Arthritis, pressure wounds, Rheumatoid Arthritis, Paraplegia Paraplegia 06/07/2015 11/05/2022 [name] Date Acquired: 186 12 [name] Weeks of Treatment: Open Open [name] Wound Status: No No [name] Wound Recurrence: No Yes [name] Clustered Wound: [name] 2 [name]  Bradley [name], Bradley [name] ([id number]) [id number] Page 1 of 10 Visit Report for 02/13/2023 Arrival Information Details Patient Name: Date of Service: Bradley [name] [name] 02/13/2023 12:45 PM Medical Record Number: [id number] Patient Account Number: [id number] Date of Birth/Sex: Treating RN: 110/30/1947 (77 y.o. [name]) Yevonne Pax Primary Care Quintel Mccalla: Marisue Ivan Other Clinician: Betha Loa Referring Arthuro Canelo: Treating Cailee Blanke/Extender: Rebecka Apley in Treatment: 186 Visit Information History Since Last Visit All ordered tests and consults were completed: No Patient Arrived: Wheel Chair Added or deleted any medications: No Arrival Time: 12:57 Any new allergies or adverse reactions: No Transfer Assistance: Michiel Sites Lift Had [name] fall or experienced change in No Patient Identification Verified: Yes activities of daily living that may affect Secondary Verification Process Completed: Yes risk of falls: Patient Requires Transmission-Based Precautions: No Signs or symptoms of abuse/neglect since last visito No Patient Has Alerts: No Hospitalized since last visit: No Implantable device outside of the clinic excluding No cellular tissue based products placed in the center since last visit: Has Dressing in Place as Prescribed: Yes Pain Present Now: No Electronic Signature(s) Signed: 02/14/2023 4:31:19 PM By: Betha Loa Entered By: Betha Loa on 02/13/2023 12:58:56 -------------------------------------------------------------------------------- Clinic Level of Care Assessment Details Patient Name: Date of Service: Bradley [name] [name] 02/13/2023 12:45 PM Medical Record Number: [id number] Patient Account Number: [id number] Date of Birth/Sex: Treating RN: Jun 27, 1945 (77 y.o. [name]) Yevonne Pax Primary Care Revis Whalin: Marisue Ivan Other Clinician: Betha Loa Referring Kensy Blizard: Treating Treazure Nery/Extender: Rebecka Apley in Treatment: 186 Clinic Level of Care Assessment Items TOOL 4 Quantity Score []  - 0 Use when only an EandM is performed on FOLLOW-UP visit ASSESSMENTS - Nursing Assessment / Reassessment X- 1 10 Reassessment of Co-morbidities (includes updates in patient status) X- 1 5 Reassessment of Adherence to Treatment Plan Bradley [name], Bradley [name] ([id number]) [id number] Page 2 of 10 ASSESSMENTS - Wound and Skin [name] ssessment / Reassessment []  - 0 Simple Wound Assessment / Reassessment - one wound X- 2 5 Complex Wound Assessment / Reassessment - multiple wounds []  - 0 Dermatologic / Skin Assessment (not related to wound area) ASSESSMENTS - Focused Assessment []  - 0 Circumferential Edema Measurements - multi extremities []  - 0 Nutritional Assessment / Counseling / Intervention []  - 0 Lower Extremity Assessment (monofilament, tuning fork, pulses) []  - 0 Peripheral Arterial Disease Assessment (using hand held doppler) ASSESSMENTS - Ostomy and/or Continence Assessment and Care []  - 0 Incontinence Assessment and Management []  - 0 Ostomy Care Assessment and Management (repouching, etc.) PROCESS - Coordination of Care X - Simple Patient / Family Education for ongoing care 1 15 []  - 0 Complex (extensive) Patient / Family Education for ongoing care []  - 0 Staff obtains Chiropractor, Records, T Results / Process Orders est []  - 0 Staff telephones HHA, Nursing Homes / Clarify orders / etc []  - 0 Routine Transfer to another Facility (non-emergent condition) []  - 0 Routine Hospital Admission (non-emergent condition) []  - 0 New Admissions / Manufacturing engineer / Ordering NPWT Apligraf, etc. , []  - 0 Emergency Hospital Admission (emergent condition) X- 1 10 Simple Discharge Coordination []  - 0 Complex (extensive) Discharge Coordination PROCESS - Special Needs []  - 0 Pediatric / Minor Patient Management []  - 0 Isolation Patient Management []  - 0 Hearing  / Language / Visual special needs []  - 0 Assessment of Community assistance (transportation, D/C planning, etc.) []  - 0 Additional assistance / Altered mentation []  - 0 Support Surface(s) Assessment (bed, cushion, seat, etc.) INTERVENTIONS -  Bradley [name], Bradley [name] ([id number]) [id number] Page 1 of 10 Visit Report for 02/13/2023 Arrival Information Details Patient Name: Date of Service: Bradley [name] [name] 02/13/2023 12:45 PM Medical Record Number: [id number] Patient Account Number: [id number] Date of Birth/Sex: Treating RN: 105/05/1946 (77 y.o. [name]) Yevonne Pax Primary Care Quintel Mccalla: Marisue Ivan Other Clinician: Betha Loa Referring Arthuro Canelo: Treating Cailee Blanke/Extender: Rebecka Apley in Treatment: 186 Visit Information History Since Last Visit All ordered tests and consults were completed: No Patient Arrived: Wheel Chair Added or deleted any medications: No Arrival Time: 12:57 Any new allergies or adverse reactions: No Transfer Assistance: Michiel Sites Lift Had [name] fall or experienced change in No Patient Identification Verified: Yes activities of daily living that may affect Secondary Verification Process Completed: Yes risk of falls: Patient Requires Transmission-Based Precautions: No Signs or symptoms of abuse/neglect since last visito No Patient Has Alerts: No Hospitalized since last visit: No Implantable device outside of the clinic excluding No cellular tissue based products placed in the center since last visit: Has Dressing in Place as Prescribed: Yes Pain Present Now: No Electronic Signature(s) Signed: 02/14/2023 4:31:19 PM By: Betha Loa Entered By: Betha Loa on 02/13/2023 12:58:56 -------------------------------------------------------------------------------- Clinic Level of Care Assessment Details Patient Name: Date of Service: Bradley [name] [name] 02/13/2023 12:45 PM Medical Record Number: [id number] Patient Account Number: [id number] Date of Birth/Sex: Treating RN: Jun 27, 1945 (77 y.o. [name]) Yevonne Pax Primary Care Revis Whalin: Marisue Ivan Other Clinician: Betha Loa Referring Kensy Blizard: Treating Treazure Nery/Extender: Rebecka Apley in Treatment: 186 Clinic Level of Care Assessment Items TOOL 4 Quantity Score []  - 0 Use when only an EandM is performed on FOLLOW-UP visit ASSESSMENTS - Nursing Assessment / Reassessment X- 1 10 Reassessment of Co-morbidities (includes updates in patient status) X- 1 5 Reassessment of Adherence to Treatment Plan Bradley [name], Bradley [name] ([id number]) [id number] Page 2 of 10 ASSESSMENTS - Wound and Skin [name] ssessment / Reassessment []  - 0 Simple Wound Assessment / Reassessment - one wound X- 2 5 Complex Wound Assessment / Reassessment - multiple wounds []  - 0 Dermatologic / Skin Assessment (not related to wound area) ASSESSMENTS - Focused Assessment []  - 0 Circumferential Edema Measurements - multi extremities []  - 0 Nutritional Assessment / Counseling / Intervention []  - 0 Lower Extremity Assessment (monofilament, tuning fork, pulses) []  - 0 Peripheral Arterial Disease Assessment (using hand held doppler) ASSESSMENTS - Ostomy and/or Continence Assessment and Care []  - 0 Incontinence Assessment and Management []  - 0 Ostomy Care Assessment and Management (repouching, etc.) PROCESS - Coordination of Care X - Simple Patient / Family Education for ongoing care 1 15 []  - 0 Complex (extensive) Patient / Family Education for ongoing care []  - 0 Staff obtains Chiropractor, Records, T Results / Process Orders est []  - 0 Staff telephones HHA, Nursing Homes / Clarify orders / etc []  - 0 Routine Transfer to another Facility (non-emergent condition) []  - 0 Routine Hospital Admission (non-emergent condition) []  - 0 New Admissions / Manufacturing engineer / Ordering NPWT Apligraf, etc. , []  - 0 Emergency Hospital Admission (emergent condition) X- 1 10 Simple Discharge Coordination []  - 0 Complex (extensive) Discharge Coordination PROCESS - Special Needs []  - 0 Pediatric / Minor Patient Management []  - 0 Isolation Patient Management []  - 0 Hearing  / Language / Visual special needs []  - 0 Assessment of Community assistance (transportation, D/C planning, etc.) []  - 0 Additional assistance / Altered mentation []  - 0 Support Surface(s) Assessment (bed, cushion, seat, etc.) INTERVENTIONS -  Bradley [name], Bradley [name] ([id number]) [id number] Page 1 of 10 Visit Report for 02/13/2023 Arrival Information Details Patient Name: Date of Service: Bradley [name] [name] 02/13/2023 12:45 PM Medical Record Number: [id number] Patient Account Number: [id number] Date of Birth/Sex: Treating RN: 105/05/1946 (77 y.o. [name]) Yevonne Pax Primary Care Quintel Mccalla: Marisue Ivan Other Clinician: Betha Loa Referring Arthuro Canelo: Treating Cailee Blanke/Extender: Rebecka Apley in Treatment: 186 Visit Information History Since Last Visit All ordered tests and consults were completed: No Patient Arrived: Wheel Chair Added or deleted any medications: No Arrival Time: 12:57 Any new allergies or adverse reactions: No Transfer Assistance: Michiel Sites Lift Had [name] fall or experienced change in No Patient Identification Verified: Yes activities of daily living that may affect Secondary Verification Process Completed: Yes risk of falls: Patient Requires Transmission-Based Precautions: No Signs or symptoms of abuse/neglect since last visito No Patient Has Alerts: No Hospitalized since last visit: No Implantable device outside of the clinic excluding No cellular tissue based products placed in the center since last visit: Has Dressing in Place as Prescribed: Yes Pain Present Now: No Electronic Signature(s) Signed: 02/14/2023 4:31:19 PM By: Betha Loa Entered By: Betha Loa on 02/13/2023 12:58:56 -------------------------------------------------------------------------------- Clinic Level of Care Assessment Details Patient Name: Date of Service: Bradley [name] [name] 02/13/2023 12:45 PM Medical Record Number: [id number] Patient Account Number: [id number] Date of Birth/Sex: Treating RN: Jun 27, 1945 (77 y.o. [name]) Yevonne Pax Primary Care Revis Whalin: Marisue Ivan Other Clinician: Betha Loa Referring Kensy Blizard: Treating Treazure Nery/Extender: Rebecka Apley in Treatment: 186 Clinic Level of Care Assessment Items TOOL 4 Quantity Score []  - 0 Use when only an EandM is performed on FOLLOW-UP visit ASSESSMENTS - Nursing Assessment / Reassessment X- 1 10 Reassessment of Co-morbidities (includes updates in patient status) X- 1 5 Reassessment of Adherence to Treatment Plan Bradley [name], Bradley [name] ([id number]) [id number] Page 2 of 10 ASSESSMENTS - Wound and Skin [name] ssessment / Reassessment []  - 0 Simple Wound Assessment / Reassessment - one wound X- 2 5 Complex Wound Assessment / Reassessment - multiple wounds []  - 0 Dermatologic / Skin Assessment (not related to wound area) ASSESSMENTS - Focused Assessment []  - 0 Circumferential Edema Measurements - multi extremities []  - 0 Nutritional Assessment / Counseling / Intervention []  - 0 Lower Extremity Assessment (monofilament, tuning fork, pulses) []  - 0 Peripheral Arterial Disease Assessment (using hand held doppler) ASSESSMENTS - Ostomy and/or Continence Assessment and Care []  - 0 Incontinence Assessment and Management []  - 0 Ostomy Care Assessment and Management (repouching, etc.) PROCESS - Coordination of Care X - Simple Patient / Family Education for ongoing care 1 15 []  - 0 Complex (extensive) Patient / Family Education for ongoing care []  - 0 Staff obtains Chiropractor, Records, T Results / Process Orders est []  - 0 Staff telephones HHA, Nursing Homes / Clarify orders / etc []  - 0 Routine Transfer to another Facility (non-emergent condition) []  - 0 Routine Hospital Admission (non-emergent condition) []  - 0 New Admissions / Manufacturing engineer / Ordering NPWT Apligraf, etc. , []  - 0 Emergency Hospital Admission (emergent condition) X- 1 10 Simple Discharge Coordination []  - 0 Complex (extensive) Discharge Coordination PROCESS - Special Needs []  - 0 Pediatric / Minor Patient Management []  - 0 Isolation Patient Management []  - 0 Hearing  / Language / Visual special needs []  - 0 Assessment of Community assistance (transportation, D/C planning, etc.) []  - 0 Additional assistance / Altered mentation []  - 0 Support Surface(s) Assessment (bed, cushion, seat, etc.) INTERVENTIONS -  Bradley [name], Bradley [name] ([id number]) [id number] Page 1 of 10 Visit Report for 02/13/2023 Arrival Information Details Patient Name: Date of Service: Bradley [name] [name] 02/13/2023 12:45 PM Medical Record Number: [id number] Patient Account Number: [id number] Date of Birth/Sex: Treating RN: 105/05/1946 (77 y.o. [name]) Yevonne Pax Primary Care Quintel Mccalla: Marisue Ivan Other Clinician: Betha Loa Referring Arthuro Canelo: Treating Cailee Blanke/Extender: Rebecka Apley in Treatment: 186 Visit Information History Since Last Visit All ordered tests and consults were completed: No Patient Arrived: Wheel Chair Added or deleted any medications: No Arrival Time: 12:57 Any new allergies or adverse reactions: No Transfer Assistance: Michiel Sites Lift Had [name] fall or experienced change in No Patient Identification Verified: Yes activities of daily living that may affect Secondary Verification Process Completed: Yes risk of falls: Patient Requires Transmission-Based Precautions: No Signs or symptoms of abuse/neglect since last visito No Patient Has Alerts: No Hospitalized since last visit: No Implantable device outside of the clinic excluding No cellular tissue based products placed in the center since last visit: Has Dressing in Place as Prescribed: Yes Pain Present Now: No Electronic Signature(s) Signed: 02/14/2023 4:31:19 PM By: Betha Loa Entered By: Betha Loa on 02/13/2023 12:58:56 -------------------------------------------------------------------------------- Clinic Level of Care Assessment Details Patient Name: Date of Service: Bradley [name] [name] 02/13/2023 12:45 PM Medical Record Number: [id number] Patient Account Number: [id number] Date of Birth/Sex: Treating RN: Jun 27, 1945 (77 y.o. [name]) Yevonne Pax Primary Care Revis Whalin: Marisue Ivan Other Clinician: Betha Loa Referring Kensy Blizard: Treating Treazure Nery/Extender: Rebecka Apley in Treatment: 186 Clinic Level of Care Assessment Items TOOL 4 Quantity Score []  - 0 Use when only an EandM is performed on FOLLOW-UP visit ASSESSMENTS - Nursing Assessment / Reassessment X- 1 10 Reassessment of Co-morbidities (includes updates in patient status) X- 1 5 Reassessment of Adherence to Treatment Plan Bradley [name], Bradley [name] ([id number]) [id number] Page 2 of 10 ASSESSMENTS - Wound and Skin [name] ssessment / Reassessment []  - 0 Simple Wound Assessment / Reassessment - one wound X- 2 5 Complex Wound Assessment / Reassessment - multiple wounds []  - 0 Dermatologic / Skin Assessment (not related to wound area) ASSESSMENTS - Focused Assessment []  - 0 Circumferential Edema Measurements - multi extremities []  - 0 Nutritional Assessment / Counseling / Intervention []  - 0 Lower Extremity Assessment (monofilament, tuning fork, pulses) []  - 0 Peripheral Arterial Disease Assessment (using hand held doppler) ASSESSMENTS - Ostomy and/or Continence Assessment and Care []  - 0 Incontinence Assessment and Management []  - 0 Ostomy Care Assessment and Management (repouching, etc.) PROCESS - Coordination of Care X - Simple Patient / Family Education for ongoing care 1 15 []  - 0 Complex (extensive) Patient / Family Education for ongoing care []  - 0 Staff obtains Chiropractor, Records, T Results / Process Orders est []  - 0 Staff telephones HHA, Nursing Homes / Clarify orders / etc []  - 0 Routine Transfer to another Facility (non-emergent condition) []  - 0 Routine Hospital Admission (non-emergent condition) []  - 0 New Admissions / Manufacturing engineer / Ordering NPWT Apligraf, etc. , []  - 0 Emergency Hospital Admission (emergent condition) X- 1 10 Simple Discharge Coordination []  - 0 Complex (extensive) Discharge Coordination PROCESS - Special Needs []  - 0 Pediatric / Minor Patient Management []  - 0 Isolation Patient Management []  - 0 Hearing  / Language / Visual special needs []  - 0 Assessment of Community assistance (transportation, D/C planning, etc.) []  - 0 Additional assistance / Altered mentation []  - 0 Support Surface(s) Assessment (bed, cushion, seat, etc.) INTERVENTIONS -

## 2023-02-20 ENCOUNTER — Inpatient Hospital Stay: Payer: Medicare HMO | Attending: Oncology

## 2023-02-20 DIAGNOSIS — N400 Enlarged prostate without lower urinary tract symptoms: Secondary | ICD-10-CM | POA: Insufficient documentation

## 2023-02-20 DIAGNOSIS — D509 Iron deficiency anemia, unspecified: Secondary | ICD-10-CM | POA: Insufficient documentation

## 2023-02-20 DIAGNOSIS — D508 Other iron deficiency anemias: Secondary | ICD-10-CM

## 2023-02-20 DIAGNOSIS — Z87891 Personal history of nicotine dependence: Secondary | ICD-10-CM | POA: Diagnosis not present

## 2023-02-20 DIAGNOSIS — G8220 Paraplegia, unspecified: Secondary | ICD-10-CM | POA: Diagnosis not present

## 2023-02-20 DIAGNOSIS — Z933 Colostomy status: Secondary | ICD-10-CM | POA: Insufficient documentation

## 2023-02-20 DIAGNOSIS — Z803 Family history of malignant neoplasm of breast: Secondary | ICD-10-CM | POA: Insufficient documentation

## 2023-02-20 LAB — CBC WITH DIFFERENTIAL (CANCER CENTER ONLY)
Abs Immature Granulocytes: 0.03 10*3/uL (ref 0.00–0.07)
Basophils Absolute: 0 10*3/uL (ref 0.0–0.1)
Basophils Relative: 0 %
Eosinophils Absolute: 0.3 10*3/uL (ref 0.0–0.5)
Eosinophils Relative: 3 %
HCT: 35.9 % — ABNORMAL LOW (ref 39.0–52.0)
Hemoglobin: 10.8 g/dL — ABNORMAL LOW (ref 13.0–17.0)
Immature Granulocytes: 0 %
Lymphocytes Relative: 20 %
Lymphs Abs: 2 10*3/uL (ref 0.7–4.0)
MCH: 22.5 pg — ABNORMAL LOW (ref 26.0–34.0)
MCHC: 30.1 g/dL (ref 30.0–36.0)
MCV: 74.9 fL — ABNORMAL LOW (ref 80.0–100.0)
Monocytes Absolute: 0.8 10*3/uL (ref 0.1–1.0)
Monocytes Relative: 8 %
Neutro Abs: 7 10*3/uL (ref 1.7–7.7)
Neutrophils Relative %: 69 %
Platelet Count: 348 10*3/uL (ref 150–400)
RBC: 4.79 MIL/uL (ref 4.22–5.81)
RDW: 15.9 % — ABNORMAL HIGH (ref 11.5–15.5)
WBC Count: 10.2 10*3/uL (ref 4.0–10.5)
nRBC: 0 % (ref 0.0–0.2)

## 2023-02-20 LAB — IRON AND TIBC
Iron: 24 ug/dL — ABNORMAL LOW (ref 45–182)
Saturation Ratios: 11 % — ABNORMAL LOW (ref 17.9–39.5)
TIBC: 217 ug/dL — ABNORMAL LOW (ref 250–450)
UIBC: 193 ug/dL

## 2023-02-20 LAB — FERRITIN: Ferritin: 402 ng/mL — ABNORMAL HIGH (ref 24–336)

## 2023-02-21 ENCOUNTER — Inpatient Hospital Stay: Payer: Medicare HMO | Admitting: Infectious Diseases

## 2023-02-21 DIAGNOSIS — G8220 Paraplegia, unspecified: Secondary | ICD-10-CM | POA: Diagnosis not present

## 2023-02-21 DIAGNOSIS — M8618 Other acute osteomyelitis, other site: Secondary | ICD-10-CM | POA: Diagnosis not present

## 2023-02-21 DIAGNOSIS — L89893 Pressure ulcer of other site, stage 3: Secondary | ICD-10-CM | POA: Diagnosis not present

## 2023-02-21 DIAGNOSIS — L89223 Pressure ulcer of left hip, stage 3: Secondary | ICD-10-CM | POA: Diagnosis not present

## 2023-02-21 DIAGNOSIS — L89153 Pressure ulcer of sacral region, stage 3: Secondary | ICD-10-CM | POA: Diagnosis not present

## 2023-02-21 DIAGNOSIS — I10 Essential (primary) hypertension: Secondary | ICD-10-CM | POA: Diagnosis not present

## 2023-02-21 DIAGNOSIS — S24103S Unspecified injury at T7-T10 level of thoracic spinal cord, sequela: Secondary | ICD-10-CM | POA: Diagnosis not present

## 2023-02-21 DIAGNOSIS — A419 Sepsis, unspecified organism: Secondary | ICD-10-CM | POA: Diagnosis not present

## 2023-02-21 DIAGNOSIS — M86152 Other acute osteomyelitis, left femur: Secondary | ICD-10-CM | POA: Diagnosis not present

## 2023-02-22 ENCOUNTER — Inpatient Hospital Stay: Payer: Medicare HMO

## 2023-02-22 ENCOUNTER — Inpatient Hospital Stay (HOSPITAL_BASED_OUTPATIENT_CLINIC_OR_DEPARTMENT_OTHER): Payer: Medicare HMO | Admitting: Oncology

## 2023-02-22 ENCOUNTER — Encounter: Payer: Self-pay | Admitting: Oncology

## 2023-02-22 VITALS — BP 110/53 | HR 86 | Temp 97.4°F | Resp 18

## 2023-02-22 DIAGNOSIS — L89223 Pressure ulcer of left hip, stage 3: Secondary | ICD-10-CM | POA: Diagnosis not present

## 2023-02-22 DIAGNOSIS — L89153 Pressure ulcer of sacral region, stage 3: Secondary | ICD-10-CM | POA: Diagnosis not present

## 2023-02-22 DIAGNOSIS — M86152 Other acute osteomyelitis, left femur: Secondary | ICD-10-CM | POA: Diagnosis not present

## 2023-02-22 DIAGNOSIS — D508 Other iron deficiency anemias: Secondary | ICD-10-CM

## 2023-02-22 DIAGNOSIS — A419 Sepsis, unspecified organism: Secondary | ICD-10-CM | POA: Diagnosis not present

## 2023-02-22 DIAGNOSIS — L89893 Pressure ulcer of other site, stage 3: Secondary | ICD-10-CM | POA: Diagnosis not present

## 2023-02-22 DIAGNOSIS — N400 Enlarged prostate without lower urinary tract symptoms: Secondary | ICD-10-CM | POA: Diagnosis not present

## 2023-02-22 DIAGNOSIS — Z933 Colostomy status: Secondary | ICD-10-CM | POA: Diagnosis not present

## 2023-02-22 DIAGNOSIS — M8618 Other acute osteomyelitis, other site: Secondary | ICD-10-CM | POA: Diagnosis not present

## 2023-02-22 DIAGNOSIS — Z87891 Personal history of nicotine dependence: Secondary | ICD-10-CM | POA: Diagnosis not present

## 2023-02-22 DIAGNOSIS — D509 Iron deficiency anemia, unspecified: Secondary | ICD-10-CM | POA: Diagnosis not present

## 2023-02-22 DIAGNOSIS — Z803 Family history of malignant neoplasm of breast: Secondary | ICD-10-CM | POA: Diagnosis not present

## 2023-02-22 DIAGNOSIS — G8220 Paraplegia, unspecified: Secondary | ICD-10-CM | POA: Diagnosis not present

## 2023-02-22 NOTE — Assessment & Plan Note (Addendum)
Labs are reviewed and discussed with patient Lab Results  Component Value Date   HGB 10.8 (L) 02/20/2023   TIBC 217 (L) 02/20/2023   IRONPCTSAT 11 (L) 02/20/2023   FERRITIN 402 (H) 02/20/2023     Continue  ferrous sulfate to twice daily.  Ferritin elevation is likely due to acute/chronic inflammation. We have discussed about GI work up and patient is concerned that he may not tolerate procedure.

## 2023-02-22 NOTE — Progress Notes (Signed)
Hematology/Oncology Progress note [phone] [phone]   Patient Care Team: Marisue Ivan, MD as PCP - General (Family Medicine) Rickard Patience, MD as Consulting Physician (Oncology)   ASSESSMENT & PLAN:   Iron deficiency anemia Labs are reviewed and discussed with patient Lab Results  Component Value Date   HGB 10.8 (L) 02/20/2023   TIBC 217 (L) 02/20/2023   IRONPCTSAT 11 (L) 02/20/2023   FERRITIN 402 (H) 02/20/2023     Continue  ferrous sulfate to twice daily.  Ferritin elevation is likely due to acute/chronic inflammation. We have discussed about GI work up and patient is concerned that he may not tolerate procedure.    Orders Placed This Encounter  Procedures   CBC with Differential (Cancer Center Only)    Standing Status:   Future    Standing Expiration Date:   02/22/2024   Iron and TIBC    Standing Status:   Future    Standing Expiration Date:   02/22/2024   Ferritin    Standing Status:   Future    Standing Expiration Date:   02/22/2024   Retic Panel    Standing Status:   Future    Standing Expiration Date:   02/22/2024   Follow up  Lab in 6 months, prior to MD + Venofer.  All questions were answered. The patient knows to call the clinic with any problems, questions or concerns.  Rickard Patience, MD, PhD [facility] Hematology Oncology 02/22/2023   CHIEF COMPLAINTS/PURPOSE OF CONSULTATION:  Anemia  HISTORY OF PRESENTING ILLNESS:  Bradley [name] 77 y.o.  male with PMH listed as below who was referred to me for evaluation of iron deficiency anemia.  Patient is paraplegic, also has chronic pressure ulcer, recently just had colostomy. He self catheterizes. Patient feels that his energy levels have been the same. He always has mild fatigue.  He has BPH for which he take finasteride. He has not see his urologist for a while.  He was seen by [facility] for elevated ESR and his chronic hand joint tenderness. He was not considered to have rheumatoid  arthritis.   INTERVAL HISTORY Patient presents to follow-up for anemia management.   Denies hematochezia, hematuria, hematemesis, epistaxis, black tarry stool or easy bruising.  He take oral ferrous sulfate 325mg  daily.  Patient was recently hospitalized in August 2024 due to sacrum osteomyelitis, sepsis Patient follows up with wound care and infectious disease specialist.  Currently off antibiotics.   ROS:  Review of Systems  Constitutional:  Negative for fatigue.  HENT:  Negative.  Negative for lump/mass and mouth sores.   Eyes: Negative.  Negative for eye problems.  Respiratory: Negative.  Negative for chest tightness and cough.   Cardiovascular: Negative.  Negative for chest pain.  Gastrointestinal: Negative.  Negative for abdominal distention and abdominal pain.       Colostomy  Endocrine: Negative.  Negative for hot flashes.  Genitourinary:  Negative for hematuria.        Self catherize  Musculoskeletal:  Positive for arthralgias. Negative for back pain.       Paraplegic  Skin:  Positive for wound. Negative for itching and rash.       Chronic pressure ulcer  Neurological: Negative.  Negative for dizziness.  Hematological: Negative.  Negative for adenopathy.  Psychiatric/Behavioral: Negative.  The patient is not nervous/anxious.     MEDICAL HISTORY:  Past Medical History:  Diagnosis Date   Anemia    Arthritis    Paralysis of both lower  limbs (HCC)    Sacral decubitus ulcer     SURGICAL HISTORY: Past Surgical History:  Procedure Laterality Date   COLONOSCOPY  06/06/2014   INCISION AND DRAINAGE OF WOUND N/A 06/04/2018   Procedure: IRRIGATION AND DEBRIDEMENT WOUND;  Surgeon: Henrene Dodge, MD;  Location: [facility] ORS;  Service: General;  Laterality: N/A;   TONSILLECTOMY     WOUND DEBRIDEMENT Left 01/28/2023   Procedure: LEFT LEG DEBRIDEMENT OF ULCER NEAR GLUTEUS;  Surgeon: Sung Amabile, DO;  Location: [facility] ORS;  Service: General;  Laterality: Left;    SOCIAL  HISTORY: Social History   Socioeconomic History   Marital status: Widowed    Spouse name: Not on file   Number of children: Not on file   Years of education: Not on file   Highest education level: Not on file  Occupational History   Not on file  Tobacco Use   Smoking status: Former    Current packs/day: 0.00    Average packs/day: 0.5 packs/day for 5.0 years (2.5 ttl pk-yrs)    Types: Cigarettes    Start date: 10    Quit date: 42    Years since quitting: 52.7   Smokeless tobacco: Never  Vaping Use   Vaping status: Never Used  Substance and Sexual Activity   Alcohol use: No   Drug use: No   Sexual activity: Not on file  Other Topics Concern   Not on file  Social History Narrative   Not on file   Social Determinants of Health   Financial Resource Strain: Medium Risk (02/12/2023)   Received from [facility]   Overall Financial Resource Strain (CARDIA)    Difficulty of Paying Living Expenses: Somewhat hard  Food Insecurity: Food Insecurity Present (02/12/2023)   [facility]   Hunger Vital Sign    Worried About Running Out of Food in the Last Year: Sometimes true    Ran Out of Food in the Last Year: Never true  Transportation Needs: Unknown (02/12/2023)   Received from [facility]   PRAPARE - Transportation    In the past 12 months, has lack of transportation kept you from medical appointments or from getting medications?: No    Lack of Transportation (Non-Medical): Patient declined  Physical Activity: Not on file  Stress: Not on file  Social Connections: Not on file  Intimate Partner Violence: Not At Risk (01/27/2023)   Humiliation, Afraid, Rape, and Kick questionnaire    Fear of Current or Ex-Partner: No    Emotionally Abused: No    Physically Abused: No    Sexually Abused: No    FAMILY HISTORY: Family History  Problem Relation Age of Onset   Breast cancer Mother     ALLERGIES:  has No Known  Allergies.  MEDICATIONS:  Current Outpatient Medications  Medication Sig Dispense Refill   acetaminophen (TYLENOL) 500 MG tablet Take 1,000 mg by mouth 3 (three) times daily.      ferrous sulfate 325 (65 FE) MG EC tablet Take 1 tablet (325 mg total) by mouth in the morning and at bedtime. 60 tablet 2   fluticasone (FLONASE) 50 MCG/ACT nasal spray Place into the nose.     Multiple Vitamin (MULTI-VITAMINS) TABS Take 1 tablet by mouth daily.      mupirocin ointment (BACTROBAN) 2 % Apply 1 Application topically at bedtime.     polyethylene glycol powder (GLYCOLAX/MIRALAX) powder Take 17 g by mouth daily as needed for mild constipation.  vitamin C (ASCORBIC ACID) 500 MG tablet Take 500 mg by mouth 2 (two) times daily.     No current facility-administered medications for this visit.      Marland Kitchen  PHYSICAL EXAMINATION: ECOG PERFORMANCE STATUS: 2 - Symptomatic, <50% confined to bed Vitals:   02/22/23 1335  BP: (!) 110/53  Pulse: 86  Resp: 18  Temp: (!) 97.4 F (36.3 C)   Filed Weights   Physical Exam Constitutional:      General: He is not in acute distress.    Appearance: He is not diaphoretic.  HENT:     Head: Normocephalic.  Eyes:     General: No scleral icterus. Cardiovascular:     Rate and Rhythm: Normal rate.  Pulmonary:     Effort: Pulmonary effort is normal. No respiratory distress.  Abdominal:     General: There is no distension.  Musculoskeletal:     Cervical back: Normal range of motion and neck supple.     Comments: Bilateral ulnar deviation of metacarpophalangeal joints and joint tenderness.    Skin:    General: Skin is warm and dry.  Neurological:     Mental Status: He is alert and oriented to person, place, and time. Mental status is at baseline.     Motor: No abnormal muscle tone.     Comments:  paraplegic  Psychiatric:        Mood and Affect: Affect normal.       LABORATORY DATA:  I have reviewed the data as listed     Latest Ref Rng & Units  02/20/2023    1:46 PM 02/02/2023    3:44 AM 01/29/2023    5:02 AM  CBC  WBC 4.0 - 10.5 K/uL 10.2  8.5  9.1   Hemoglobin 13.0 - 17.0 g/dL 40.9  9.9  81.1   Hematocrit 39.0 - 52.0 % 35.9  32.2  33.0   Platelets 150 - 400 K/uL 348  469  497       Latest Ref Rng & Units 02/02/2023    3:44 AM 02/01/2023    3:34 AM 01/31/2023    4:29 AM  CMP  Glucose 70 - 99 mg/dL 86   92   BUN 8 - 23 mg/dL 10   7   Creatinine 9.14 - 1.24 mg/dL 7.82  9.56  2.13   Sodium 135 - 145 mmol/L 140   140   Potassium 3.5 - 5.1 mmol/L 4.0   3.8   Chloride 98 - 111 mmol/L 103   104   CO2 22 - 32 mmol/L 28   29   Calcium 8.9 - 10.3 mg/dL 8.4   8.2     Iron/TIBC/Ferritin/ %Sat    Component Value Date/Time   IRON 24 (L) 02/20/2023 1346   TIBC 217 (L) 02/20/2023 1346   FERRITIN 402 (H) 02/20/2023 1346   IRONPCTSAT 11 (L) 02/20/2023 1346

## 2023-02-25 DIAGNOSIS — M8618 Other acute osteomyelitis, other site: Secondary | ICD-10-CM | POA: Diagnosis not present

## 2023-02-25 DIAGNOSIS — L89893 Pressure ulcer of other site, stage 3: Secondary | ICD-10-CM | POA: Diagnosis not present

## 2023-02-25 DIAGNOSIS — M86152 Other acute osteomyelitis, left femur: Secondary | ICD-10-CM | POA: Diagnosis not present

## 2023-02-25 DIAGNOSIS — S24103S Unspecified injury at T7-T10 level of thoracic spinal cord, sequela: Secondary | ICD-10-CM | POA: Diagnosis not present

## 2023-02-25 DIAGNOSIS — L89223 Pressure ulcer of left hip, stage 3: Secondary | ICD-10-CM | POA: Diagnosis not present

## 2023-02-25 DIAGNOSIS — I10 Essential (primary) hypertension: Secondary | ICD-10-CM | POA: Diagnosis not present

## 2023-02-25 DIAGNOSIS — G8220 Paraplegia, unspecified: Secondary | ICD-10-CM | POA: Diagnosis not present

## 2023-02-25 DIAGNOSIS — A419 Sepsis, unspecified organism: Secondary | ICD-10-CM | POA: Diagnosis not present

## 2023-02-25 DIAGNOSIS — L89153 Pressure ulcer of sacral region, stage 3: Secondary | ICD-10-CM | POA: Diagnosis not present

## 2023-02-28 ENCOUNTER — Encounter: Payer: Medicare HMO | Admitting: Physician Assistant

## 2023-02-28 DIAGNOSIS — L89223 Pressure ulcer of left hip, stage 3: Secondary | ICD-10-CM | POA: Diagnosis not present

## 2023-02-28 DIAGNOSIS — L89153 Pressure ulcer of sacral region, stage 3: Secondary | ICD-10-CM | POA: Diagnosis not present

## 2023-02-28 DIAGNOSIS — L89154 Pressure ulcer of sacral region, stage 4: Secondary | ICD-10-CM | POA: Diagnosis not present

## 2023-02-28 DIAGNOSIS — G8220 Paraplegia, unspecified: Secondary | ICD-10-CM | POA: Diagnosis not present

## 2023-02-28 DIAGNOSIS — M8618 Other acute osteomyelitis, other site: Secondary | ICD-10-CM | POA: Diagnosis not present

## 2023-02-28 DIAGNOSIS — L89893 Pressure ulcer of other site, stage 3: Secondary | ICD-10-CM | POA: Diagnosis not present

## 2023-02-28 DIAGNOSIS — L89894 Pressure ulcer of other site, stage 4: Secondary | ICD-10-CM | POA: Diagnosis not present

## 2023-02-28 DIAGNOSIS — A419 Sepsis, unspecified organism: Secondary | ICD-10-CM | POA: Diagnosis not present

## 2023-02-28 DIAGNOSIS — M8668 Other chronic osteomyelitis, other site: Secondary | ICD-10-CM | POA: Diagnosis not present

## 2023-02-28 DIAGNOSIS — M86152 Other acute osteomyelitis, left femur: Secondary | ICD-10-CM | POA: Diagnosis not present

## 2023-02-28 NOTE — Progress Notes (Signed)
[name], [name] ([id number]) [id number] Page 1 of 10 Visit Report for 02/28/2023 Arrival Information Details Patient Name: Date of Service: Bradley Pink [name] 02/28/2023 12:30 PM Medical Record Number: [id number] Patient Account Number: [id number] Date of Birth/Sex: Treating RN: Jan 25, 1946 (77 y.o. Bradley [name]) Bradley [name] Primary Care Bradley [name]: Bradley [name] Other Clinician: Referring Bradley [name]: Treating Bradley [name]/Extender: Bradley [name] in Treatment: 189 Visit Information History Since Last Visit Added or deleted any medications: No Patient Arrived: Wheel Chair Any new allergies or adverse reactions: No Arrival Time: 12:31 Had [name] fall or experienced change in No Accompanied By: self activities of daily living that may affect Transfer Assistance: Michiel Sites Lift risk of falls: Patient Identification Verified: Yes Signs or symptoms of abuse/neglect since last visito No Secondary Verification Process Completed: Yes Hospitalized since last visit: No Patient Requires Transmission-Based Precautions: No Implantable device outside of the clinic excluding No Patient Has Alerts: No cellular tissue based products placed in the center since last visit: Has Dressing in Place as Prescribed: Yes Pain Present Now: No Electronic Signature(s) Signed: 02/28/2023 1:36:08 PM By: Bradley Pax RN Previous Signature: 02/28/2023 12:57:10 PM Version By: Bradley Pax RN Entered By: Bradley [name] on 02/28/2023 13:36:08 -------------------------------------------------------------------------------- Clinic Level of Care Assessment Details Patient Name: Date of Service: Bradley Pink [name] 02/28/2023 12:30 PM Medical Record Number: [id number] Patient Account Number: [id number] Date of Birth/Sex: Treating RN: [birth year] (77 y.o. Bradley [name]) Bradley [name] Primary Care Niccolas Loeper: Bradley [name] Other Clinician: Referring Bradley [name]: Treating Bradley [name]/Extender: Bradley [name] in Treatment: 189 Clinic Level of Care Assessment Items TOOL 4 Quantity Score X- 1 0 Use when only an EandM is performed on FOLLOW-UP visit ASSESSMENTS - Nursing Assessment / Reassessment X- 1 10 Reassessment of Co-morbidities (includes updates in patient status) X- 1 5 Reassessment of Adherence to Treatment Plan Bradley [name], Bradley [name] ([id number]) [phone].pdf Page 2 of 10 ASSESSMENTS - Wound and Skin [name] ssessment / Reassessment X - Simple Wound Assessment / Reassessment - one wound 1 5 []  - 0 Complex Wound Assessment / Reassessment - multiple wounds []  - 0 Dermatologic / Skin Assessment (not related to wound area) ASSESSMENTS - Focused Assessment []  - 0 Circumferential Edema Measurements - multi extremities []  - 0 Nutritional Assessment / Counseling / Intervention []  - 0 Lower Extremity Assessment (monofilament, tuning fork, pulses) []  - 0 Peripheral Arterial Disease Assessment (using hand held doppler) ASSESSMENTS - Ostomy and/or Continence Assessment and Care []  - 0 Incontinence Assessment and Management []  - 0 Ostomy Care Assessment and Management (repouching, etc.) PROCESS - Coordination of Care X - Simple Patient / Family Education for ongoing care 1 15 []  - 0 Complex (extensive) Patient / Family Education for ongoing care []  - 0 Staff obtains Chiropractor, Records, T Results / Process Orders est []  - 0 Staff telephones HHA, Nursing Homes / Clarify orders / etc []  - 0 Routine Transfer to another Facility (non-emergent condition) []  - 0 Routine Hospital Admission (non-emergent condition) []  - 0 New Admissions / Manufacturing engineer / Ordering NPWT Apligraf, etc. , []  - 0 Emergency Hospital Admission (emergent condition) X- 1 10 Simple Discharge Coordination []  - 0 Complex (extensive) Discharge Coordination PROCESS - Special Needs []  - 0 Pediatric / Minor Patient Management []  - 0 Isolation Patient  Management []  - 0 Hearing / Language / Visual special needs []  - 0 Assessment of Community assistance (transportation, D/C planning, etc.) []  - 0 Additional assistance / Altered mentation []  - 0 Support Surface(s) Assessment (bed, cushion,  No Slough/Fibrino Yes Wound Bed Granulation Amount: Large (67-100%) Exposed Structure Granulation Quality: Red, Pink Fascia Exposed: No Necrotic Amount: Small (1-33%) Fat Layer (Subcutaneous Tissue) Exposed: Yes Necrotic Quality: Adherent Slough Tendon Exposed: No Muscle Exposed: Yes Necrosis of Muscle: No Joint Exposed: No Bone Exposed:  No ORYON, BRUGGEMAN [name] ([id number]) [phone].pdf Page 8 of 10 Treatment Notes Wound #10 (Sacrum) Wound Laterality: Midline Cleanser Dakin 16 (oz) 0.25 Discharge Instruction: Use as directed. Peri-Wound Care Topical Primary Dressing Gauze Discharge Instruction: As directed: moistened with Dakins Solution Secondary Dressing Zetuvit Plus 4x4 (in/in) Secured With Medipore T - 37M Medipore H Soft Cloth Surgical T ape ape, 2x2 (in/yd) Compression Wrap Compression Stockings Add-Ons Electronic Signature(s) Signed: 02/28/2023 12:59:31 PM By: Bradley Pax RN Entered By: Bradley [name] on 02/28/2023 12:59:30 -------------------------------------------------------------------------------- Wound Assessment Details Patient Name: Date of Service: Bradley Pink [name] 02/28/2023 12:30 PM Medical Record Number: [id number] Patient Account Number: [id number] Date of Birth/Sex: Treating RN: 1946/02/02 (77 y.o. Bradley [name]) Bradley [name] Primary Care Rossana Molchan: Bradley [name] Other Clinician: Referring Lennan Malone: Treating Yee Joss/Extender: Bradley [name] in Treatment: 189 Wound Status Wound Number: 14 Primary Pressure Ulcer Etiology: Wound Location: Left, Posterior Upper Leg Wound Open Wounding Event: Shear/Friction Status: Date Acquired: 11/05/2022 Comorbid Anemia, Hypertension, History of pressure wounds, [name] Of Treatment: 14 History: Rheumatoid Arthritis, Paraplegia Clustered Wound: Yes Photos Wound Measurements Bradley [name], Bradley [name] ([id number]) Length: (cm) 6 Width: (cm) 2.2 Depth: (cm) 4.3 Clustered Quantity: 2 Area: (cm) 10.367 Volume: (cm) 44.579 [id number] Page 9 of 10 % Reduction in Area: -388.8% % Reduction in Volume: -212181% Epithelialization: None Tunneling: Yes Position (o'clock): 12 Maximum Distance: (cm) 9.5 Undermining: No Wound Description Classification: Category/Stage IV Wound Margin: Thickened Exudate  Amount: Medium Exudate Type: Serous Exudate Color: amber Foul Odor After Cleansing: No Slough/Fibrino No Wound Bed Granulation Amount: Large (67-100%) Exposed Structure Granulation Quality: Pink Fascia Exposed: No Necrotic Amount: None Present (0%) Fat Layer (Subcutaneous Tissue) Exposed: Yes Tendon Exposed: No Muscle Exposed: No Joint Exposed: No Bone Exposed: Yes Treatment Notes Wound #14 (Upper Leg) Wound Laterality: Left, Posterior Cleanser Dakin 16 (oz) 0.25 Discharge Instruction: Use as directed. Peri-Wound Care Topical Primary Dressing Gauze Discharge Instruction: As directed: moistened with Dakins Solution Secondary Dressing Zetuvit Plus 4x4 (in/in) Secured With Medipore T - 37M Medipore H Soft Cloth Surgical T ape ape, 2x2 (in/yd) Compression Wrap Compression Stockings Add-Ons Electronic Signature(s) Signed: 02/28/2023 1:00:35 PM By: Bradley Pax RN Entered By: Bradley [name] on 02/28/2023 13:00:35 -------------------------------------------------------------------------------- Vitals Details Patient Name: Date of Service: Bradley Pink [name] 02/28/2023 12:30 PM Medical Record Number: [id number] Patient Account Number: [id number] Date of Birth/Sex: Treating RN: [birth year] (77 y.o. [name] Primary Care Donte Lenzo: Bradley [name] Other Clinician: DENIZ, Bradley [name] ([id number]) [id number] Page 10 of 10 Referring Arita Severtson: Treating Guida Asman/Extender: Bradley [name] in Treatment: 189 Vital Signs Time Taken: 12:45 Temperature (F): 98.2 Pulse (bpm): 76 Respiratory Rate (breaths/min): 18 Blood Pressure (mmHg): 102/55 Reference Range: 80 - 120 mg / dl Electronic Signature(s) Signed: 02/28/2023 12:57:31 PM By: Bradley Pax RN Entered By: Bradley [name] on 02/28/2023 12:57:31  [name], [name] ([id number]) [id number] Page 1 of 10 Visit Report for 02/28/2023 Arrival Information Details Patient Name: Date of Service: Bradley Pink [name] 02/28/2023 12:30 PM Medical Record Number: [id number] Patient Account Number: [id number] Date of Birth/Sex: Treating RN: Jan 25, 1946 (77 y.o. Bradley [name]) Bradley [name] Primary Care Bradley [name]: Bradley [name] Other Clinician: Referring Bradley [name]: Treating Bradley [name]/Extender: Bradley [name] in Treatment: 189 Visit Information History Since Last Visit Added or deleted any medications: No Patient Arrived: Wheel Chair Any new allergies or adverse reactions: No Arrival Time: 12:31 Had [name] fall or experienced change in No Accompanied By: self activities of daily living that may affect Transfer Assistance: Michiel Sites Lift risk of falls: Patient Identification Verified: Yes Signs or symptoms of abuse/neglect since last visito No Secondary Verification Process Completed: Yes Hospitalized since last visit: No Patient Requires Transmission-Based Precautions: No Implantable device outside of the clinic excluding No Patient Has Alerts: No cellular tissue based products placed in the center since last visit: Has Dressing in Place as Prescribed: Yes Pain Present Now: No Electronic Signature(s) Signed: 02/28/2023 1:36:08 PM By: Bradley Pax RN Previous Signature: 02/28/2023 12:57:10 PM Version By: Bradley Pax RN Entered By: Bradley [name] on 02/28/2023 13:36:08 -------------------------------------------------------------------------------- Clinic Level of Care Assessment Details Patient Name: Date of Service: Bradley Pink [name] 02/28/2023 12:30 PM Medical Record Number: [id number] Patient Account Number: [id number] Date of Birth/Sex: Treating RN: [birth year] (77 y.o. Bradley [name]) Bradley [name] Primary Care Niccolas Loeper: Bradley [name] Other Clinician: Referring Bradley [name]: Treating Bradley [name]/Extender: Bradley [name] in Treatment: 189 Clinic Level of Care Assessment Items TOOL 4 Quantity Score X- 1 0 Use when only an EandM is performed on FOLLOW-UP visit ASSESSMENTS - Nursing Assessment / Reassessment X- 1 10 Reassessment of Co-morbidities (includes updates in patient status) X- 1 5 Reassessment of Adherence to Treatment Plan Bradley [name], Bradley [name] ([id number]) [phone].pdf Page 2 of 10 ASSESSMENTS - Wound and Skin [name] ssessment / Reassessment X - Simple Wound Assessment / Reassessment - one wound 1 5 []  - 0 Complex Wound Assessment / Reassessment - multiple wounds []  - 0 Dermatologic / Skin Assessment (not related to wound area) ASSESSMENTS - Focused Assessment []  - 0 Circumferential Edema Measurements - multi extremities []  - 0 Nutritional Assessment / Counseling / Intervention []  - 0 Lower Extremity Assessment (monofilament, tuning fork, pulses) []  - 0 Peripheral Arterial Disease Assessment (using hand held doppler) ASSESSMENTS - Ostomy and/or Continence Assessment and Care []  - 0 Incontinence Assessment and Management []  - 0 Ostomy Care Assessment and Management (repouching, etc.) PROCESS - Coordination of Care X - Simple Patient / Family Education for ongoing care 1 15 []  - 0 Complex (extensive) Patient / Family Education for ongoing care []  - 0 Staff obtains Chiropractor, Records, T Results / Process Orders est []  - 0 Staff telephones HHA, Nursing Homes / Clarify orders / etc []  - 0 Routine Transfer to another Facility (non-emergent condition) []  - 0 Routine Hospital Admission (non-emergent condition) []  - 0 New Admissions / Manufacturing engineer / Ordering NPWT Apligraf, etc. , []  - 0 Emergency Hospital Admission (emergent condition) X- 1 10 Simple Discharge Coordination []  - 0 Complex (extensive) Discharge Coordination PROCESS - Special Needs []  - 0 Pediatric / Minor Patient Management []  - 0 Isolation Patient  Management []  - 0 Hearing / Language / Visual special needs []  - 0 Assessment of Community assistance (transportation, D/C planning, etc.) []  - 0 Additional assistance / Altered mentation []  - 0 Support Surface(s) Assessment (bed, cushion,  [name], [name] ([id number]) [id number] Page 1 of 10 Visit Report for 02/28/2023 Arrival Information Details Patient Name: Date of Service: Bradley Pink [name] 02/28/2023 12:30 PM Medical Record Number: [id number] Patient Account Number: [id number] Date of Birth/Sex: Treating RN: Jan 25, 1946 (77 y.o. Bradley [name]) Bradley [name] Primary Care Bradley [name]: Bradley [name] Other Clinician: Referring Bradley [name]: Treating Bradley [name]/Extender: Bradley [name] in Treatment: 189 Visit Information History Since Last Visit Added or deleted any medications: No Patient Arrived: Wheel Chair Any new allergies or adverse reactions: No Arrival Time: 12:31 Had [name] fall or experienced change in No Accompanied By: self activities of daily living that may affect Transfer Assistance: Michiel Sites Lift risk of falls: Patient Identification Verified: Yes Signs or symptoms of abuse/neglect since last visito No Secondary Verification Process Completed: Yes Hospitalized since last visit: No Patient Requires Transmission-Based Precautions: No Implantable device outside of the clinic excluding No Patient Has Alerts: No cellular tissue based products placed in the center since last visit: Has Dressing in Place as Prescribed: Yes Pain Present Now: No Electronic Signature(s) Signed: 02/28/2023 1:36:08 PM By: Bradley Pax RN Previous Signature: 02/28/2023 12:57:10 PM Version By: Bradley Pax RN Entered By: Bradley [name] on 02/28/2023 13:36:08 -------------------------------------------------------------------------------- Clinic Level of Care Assessment Details Patient Name: Date of Service: Bradley Pink [name] 02/28/2023 12:30 PM Medical Record Number: [id number] Patient Account Number: [id number] Date of Birth/Sex: Treating RN: [birth year] (77 y.o. Bradley [name]) Bradley [name] Primary Care Niccolas Loeper: Bradley [name] Other Clinician: Referring Bradley [name]: Treating Bradley [name]/Extender: Bradley [name] in Treatment: 189 Clinic Level of Care Assessment Items TOOL 4 Quantity Score X- 1 0 Use when only an EandM is performed on FOLLOW-UP visit ASSESSMENTS - Nursing Assessment / Reassessment X- 1 10 Reassessment of Co-morbidities (includes updates in patient status) X- 1 5 Reassessment of Adherence to Treatment Plan Bradley [name], Bradley [name] ([id number]) [phone].pdf Page 2 of 10 ASSESSMENTS - Wound and Skin [name] ssessment / Reassessment X - Simple Wound Assessment / Reassessment - one wound 1 5 []  - 0 Complex Wound Assessment / Reassessment - multiple wounds []  - 0 Dermatologic / Skin Assessment (not related to wound area) ASSESSMENTS - Focused Assessment []  - 0 Circumferential Edema Measurements - multi extremities []  - 0 Nutritional Assessment / Counseling / Intervention []  - 0 Lower Extremity Assessment (monofilament, tuning fork, pulses) []  - 0 Peripheral Arterial Disease Assessment (using hand held doppler) ASSESSMENTS - Ostomy and/or Continence Assessment and Care []  - 0 Incontinence Assessment and Management []  - 0 Ostomy Care Assessment and Management (repouching, etc.) PROCESS - Coordination of Care X - Simple Patient / Family Education for ongoing care 1 15 []  - 0 Complex (extensive) Patient / Family Education for ongoing care []  - 0 Staff obtains Chiropractor, Records, T Results / Process Orders est []  - 0 Staff telephones HHA, Nursing Homes / Clarify orders / etc []  - 0 Routine Transfer to another Facility (non-emergent condition) []  - 0 Routine Hospital Admission (non-emergent condition) []  - 0 New Admissions / Manufacturing engineer / Ordering NPWT Apligraf, etc. , []  - 0 Emergency Hospital Admission (emergent condition) X- 1 10 Simple Discharge Coordination []  - 0 Complex (extensive) Discharge Coordination PROCESS - Special Needs []  - 0 Pediatric / Minor Patient Management []  - 0 Isolation Patient  Management []  - 0 Hearing / Language / Visual special needs []  - 0 Assessment of Community assistance (transportation, D/C planning, etc.) []  - 0 Additional assistance / Altered mentation []  - 0 Support Surface(s) Assessment (bed, cushion,

## 2023-02-28 NOTE — Progress Notes (Signed)
RYVER, ADDAMS ([id number]) [id number] Page 1 of 4 Visit Report for 02/28/2023 Chief Complaint Document Details Patient Name: Date of Service: Bradley [name] MES A. 02/28/2023 12:30 PM Medical Record Number: [id number] Patient Account Number: [id number] Date of Birth/Sex: Treating RN: 1946/03/30 (77 y.o. [name]) Yevonne [name] Primary Care Provider: Marisue Ivan Other Clinician: Referring Provider: Treating Provider/Extender: Rebecka Apley in Treatment: [phone] Information Obtained from: Patient Chief Complaint sacral ulcer, left posterior leg/thigh wound Electronic Signature(s) Signed: 02/28/2023 12:45:43 PM By: Allen Derry PA-C Entered By: Allen Derry on 02/28/2023 09:45:43 -------------------------------------------------------------------------------- Physician Orders Details Patient Name: Date of Service: Bradley [name] MES A. 02/28/2023 12:30 PM Medical Record Number: [id number] Patient Account Number: [id number] Date of Birth/Sex: Treating RN: May 28, 1946 (77 y.o. Bradley [name] Primary Care Provider: Marisue Ivan Other Clinician: Referring Provider: Treating Provider/Extender: Rebecka Apley in Treatment: [phone] Verbal / Phone Orders: No Diagnosis Coding ICD-10 Coding Code Description L89.154 Pressure ulcer of sacral region, stage 4 L89.894 Pressure ulcer of other site, stage 4 M86.68 Other chronic osteomyelitis, other site G82.20 Paraplegia, unspecified Follow-up Appointments Return Appointment in 1 week. Home Health Home Health Company: - [facility] for wound care. May utilize formulary equivalent dressing for wound treatment orders unless otherwise specified. KRISHIV, ROESSLER ([id number]) [id number] Page 2 of 4 Home Health Nurse may visit PRN to address patients wound care needs. Frances Furbish [phone] **Please direct any NON-WOUND related issues/requests for  orders to patient's Primary Care Physician. **If current dressing causes regression in wound condition, may D/C ordered dressing product/s and apply Normal Saline Moist Dressing daily until next Wound Healing Center or Other MD appointment. **Notify Wound Healing Center of regression in wound condition at [phone]. Other Home Health Orders/Instructions: Bathing/ Shower/ Hygiene May shower; gently cleanse wound with antibacterial soap, rinse and pat dry prior to dressing wounds Off-Loading Roho cushion for wheelchair Low air-loss mattress (Group 2) Turn and reposition every 2 hours Wound Treatment Wound #10 - Sacrum Wound Laterality: Midline Cleanser: Dakin 16 (oz) 0.25 1 x Per Day/30 Days Discharge Instructions: Use as directed. Prim Dressing: Gauze (Generic) 1 x Per Day/30 Days ary Discharge Instructions: moistened with Dakins Solution packed into wound Secondary Dressing: ABD Pad 5x9 (in/in) 1 x Per Day/30 Days Discharge Instructions: Cover with ABD pad Secured With: Medipore T - 30M Medipore H Soft Cloth Surgical T ape ape, 2x2 (in/yd) (Generic) 1 x Per Day/30 Days Wound #14 - Upper Leg Wound Laterality: Left, Posterior Cleanser: Dakin 16 (oz) 0.25 1 x Per Day/30 Days Discharge Instructions: Use as directed. Prim Dressing: Gauze (Generic) 1 x Per Day/30 Days ary Discharge Instructions: moistened with Dakins Solution packed into wound Secondary Dressing: ABD Pad 5x9 (in/in) 1 x Per Day/30 Days Discharge Instructions: Cover with ABD pad Secured With: Medipore T - 30M Medipore H Soft Cloth Surgical T ape ape, 2x2 (in/yd) (Generic) 1 x Per Day/30 Days Electronic Signature(s) Unsigned Previous Signature: 02/28/2023 1:08:58 PM Version By: Yevonne Pax RN Entered By: Yevonne [name] on 02/28/2023 10:35:28 -------------------------------------------------------------------------------- Problem List Details Patient Name: Date of Service: Bradley [name] MES A. 02/28/2023 12:30 PM Medical  Record Number: [id number] Patient Account Number: [id number] Date of Birth/Sex: Treating RN: September 22, 1945 (77 y.o. Bradley [name] Primary Care Provider: Marisue Ivan Other Clinician: Referring Provider: Treating Provider/Extender: Maxwell Caul Weeks in Treatment: 189 Active Problems ICD-10 Encounter Code Description Active Date MDM Diagnosis L89.154 Pressure ulcer of sacral region, stage 4 07/16/2019 No Yes Bradley [name]  A ([id number]) [id number] Page 3 of 4 [phone] Pressure ulcer of other site, stage 4 11/16/2022 No Yes M86.68 Other chronic osteomyelitis, other site 07/16/2019 No Yes G82.20 Paraplegia, unspecified 07/16/2019 No Yes Inactive Problems ICD-10 Code Description Active Date Inactive Date 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 02/28/2023 Medical Record Number: [id number] Patient Account Number: [id number] Date of Birth/Sex: Treating RN: [birth year] (77 y.o. Bradley [name] Primary Care Provider: Marisue Ivan Other Clinician: Referring Provider: Treating Provider/Extender: Maxwell Caul Weeks in Treatment: 189 Diagnosis Coding ICD-10 Codes Code Description L89.154 Pressure ulcer of sacral region, stage 4 L89.894 Pressure ulcer of other site, stage 4 M86.68 Other chronic osteomyelitis, other site G82.20 Paraplegia, unspecified Facility Procedures : NARVEL, KATA Code: [id number] AMES A  ([id number]) Description: 99213 - WOUND CARE VISIT-LEV 3 EST PT [id number] Modifier: [id number] Quantity: 1 Page 4 of 4 Electronic Signature(s) Signed: 02/28/2023 1:11:03 PM By: Yevonne Pax RN Entered By: Yevonne [name] on 02/28/2023 10:11:03

## 2023-03-01 ENCOUNTER — Ambulatory Visit: Payer: Medicare HMO | Admitting: Internal Medicine

## 2023-03-02 ENCOUNTER — Ambulatory Visit: Payer: Medicare HMO | Attending: Infectious Diseases | Admitting: Infectious Diseases

## 2023-03-02 ENCOUNTER — Encounter: Payer: Self-pay | Admitting: Infectious Diseases

## 2023-03-02 VITALS — BP 109/57 | HR 77 | Temp 97.1°F

## 2023-03-02 DIAGNOSIS — N400 Enlarged prostate without lower urinary tract symptoms: Secondary | ICD-10-CM | POA: Insufficient documentation

## 2023-03-02 DIAGNOSIS — D509 Iron deficiency anemia, unspecified: Secondary | ICD-10-CM | POA: Diagnosis not present

## 2023-03-02 DIAGNOSIS — E785 Hyperlipidemia, unspecified: Secondary | ICD-10-CM | POA: Insufficient documentation

## 2023-03-02 DIAGNOSIS — I10 Essential (primary) hypertension: Secondary | ICD-10-CM | POA: Insufficient documentation

## 2023-03-02 DIAGNOSIS — G8220 Paraplegia, unspecified: Secondary | ICD-10-CM | POA: Insufficient documentation

## 2023-03-02 DIAGNOSIS — M8660 Other chronic osteomyelitis, unspecified site: Secondary | ICD-10-CM | POA: Diagnosis not present

## 2023-03-02 DIAGNOSIS — Z933 Colostomy status: Secondary | ICD-10-CM | POA: Insufficient documentation

## 2023-03-02 DIAGNOSIS — N319 Neuromuscular dysfunction of bladder, unspecified: Secondary | ICD-10-CM | POA: Diagnosis not present

## 2023-03-02 DIAGNOSIS — L89329 Pressure ulcer of left buttock, unspecified stage: Secondary | ICD-10-CM | POA: Insufficient documentation

## 2023-03-02 DIAGNOSIS — L89153 Pressure ulcer of sacral region, stage 3: Secondary | ICD-10-CM

## 2023-03-02 NOTE — Patient Instructions (Signed)
Today we will do labs- I will discuss with Wound center to see whether you need further antibiotics-

## 2023-03-02 NOTE — Progress Notes (Signed)
NAME: Bradley [name]  DOB: 30-Nov-1945  MRN: [id number]  Date/Time: 03/02/2023 12:10 PM   Subjective:  Bradley [name] is a 77 y.o. with a history of with a history of Hypertension, T7 spinal cord injury with paraplegia, neurogenic bladder, BPH iron deficiency anemia, hyperlipidemia, status post colostomy, chronic sacral /ischial ulcer  Here for Follow up after  recent discharge for rt ischial ulcer which was infected He underwent debridement and culture was proteus He was sent on Po levaquin on 02/03/23 for 2 weeks with extension of 2 more weeks by Wound clinic if needed HE was seen in wound clinic this Tuesday . He is no longer on oral levaquin He is doing much better ?   Past Medical History:  Diagnosis Date   Anemia    Arthritis    Paralysis of both lower limbs (HCC)    Sacral decubitus ulcer     Past Surgical History:  Procedure Laterality Date   COLONOSCOPY  06/06/2014   INCISION AND DRAINAGE OF WOUND N/A 06/04/2018   Procedure: IRRIGATION AND DEBRIDEMENT WOUND;  Surgeon: Henrene Dodge, MD;  Location: [facility] ORS;  Service: General;  Laterality: N/A;   TONSILLECTOMY     WOUND DEBRIDEMENT Left 01/28/2023   Procedure: LEFT LEG DEBRIDEMENT OF ULCER NEAR GLUTEUS;  Surgeon: Sung Amabile, DO;  Location: [facility];  Service: General;  Laterality: Left;    Social History   Socioeconomic History   Marital status: Widowed    Spouse name: Not on file   Number of children: Not on file   Years of education: Not on file   Highest education level: Not on file  Occupational History   Not on file  Tobacco Use   Smoking status: Former    Current packs/day: 0.00    Average packs/day: 0.5 packs/day for 5.0 years (2.5 ttl pk-yrs)    Types: Cigarettes    Start date: 25    Quit date: 68    Years since quitting: 52.7   Smokeless tobacco: Never  Vaping Use   Vaping status: Never Used  Substance and Sexual Activity   Alcohol use: No   Drug use: No   Sexual activity: Not on file  Other  Topics Concern   Not on file  Social History Narrative   Not on file   Social Determinants of Health   Financial Resource Strain: Medium Risk (02/12/2023)   Received from [facility]   Overall Financial Resource Strain (CARDIA)    Difficulty of Paying Living Expenses: Somewhat hard  Food Insecurity: Food Insecurity Present (02/12/2023)   Received from [facility]   Hunger Vital Sign    Worried About Running Out of Food in the Last Year: Sometimes true    Ran Out of Food in the Last Year: Never true  Transportation Needs: Unknown (02/12/2023)   Received from [facility]   PRAPARE - Transportation    In the past 12 months, has lack of transportation kept you from medical appointments or from getting medications?: No    Lack of Transportation (Non-Medical): Patient declined  Physical Activity: Not on file  Stress: Not on file  Social Connections: Not on file  Intimate Partner Violence: Not At Risk (01/27/2023)   Humiliation, Afraid, Rape, and Kick questionnaire    Fear of Current or Ex-Partner: No    Emotionally Abused: No    Physically Abused: No    Sexually Abused: No    Family History  Problem Relation Age  of Onset   Breast cancer Mother    Allergies  Allergen Reactions   Lisinopril Cough   I? Current Outpatient Medications  Medication Sig Dispense Refill   acetaminophen (TYLENOL) 500 MG tablet Take 1,000 mg by mouth 3 (three) times daily.      ferrous sulfate 325 (65 FE) MG EC tablet Take 1 tablet (325 mg total) by mouth in the morning and at bedtime. 60 tablet 2   Multiple Vitamin (MULTI-VITAMINS) TABS Take 1 tablet by mouth daily.      polyethylene glycol powder (GLYCOLAX/MIRALAX) powder Take 17 g by mouth daily as needed for mild constipation.      vitamin C (ASCORBIC ACID) 500 MG tablet Take 500 mg by mouth 2 (two) times daily.     No current facility-administered medications for this visit.     Abtx:   Anti-infectives (From admission, onward)    None       REVIEW OF SYSTEMS:  Const: negative fever, negative chills, negative weight loss Eyes: negative diplopia or visual changes, negative eye pain ENT: negative coryza, negative sore throat Resp: negative cough, hemoptysis, dyspnea Cards: negative for chest pain, palpitations, lower extremity edema GU: CIC GI: Negative for abdominal pain, diarrhea, bleeding, constipation Skin: negative for rash and pruritus Heme: negative for easy bruising and gum/nose bleeding MS: negative for myalgias, arthralgias, back pain and muscle weakness Neurolo:paraplegia Psych: negative for feelings of anxiety, depression  Endocrine: negative for thyroid, diabetes Allergy/Immunology- as above Objective:  VITALS:  BP (!) 109/57   Pulse 77   Temp (!) 97.1 F (36.2 C) (Temporal)   PHYSICAL EXAM:  General: Alert, cooperative, no distress, appears young for age-in wheel chair Head: Normocephalic, without obvious abnormality, atraumatic. Eyes: Conjunctivae clear, anicteric sclerae. Pupils are equal ENT Nares normal. No drainage or sinus tenderness. Lips, mucosa, and tongue normal. No Thrush Neck: Supple, symmetrical, no adenopathy, thyroid: non tender no carotid bruit and no JVD. Back wound could not be examined as he could not transfer to bed Lungs: Clear to auscultation bilaterally. No Wheezing or Rhonchi. No rales. Heart: Regular rate and rhythm, no murmur, rub or gallop. Abdomen: Soft, non-tender,not distended. Bowel sounds normal. No masses Extremities: atraumatic, no cyanosis. No edema. No clubbing Skin: No rashes or lesions. Or bruising Lymph: Cervical, supraclavicular normal. Neurologic: did not examine      Pertinent Labs Lab Results    Latest Ref Rng & Units 02/20/2023    1:46 PM 02/02/2023    3:44 AM 01/29/2023    5:02 AM  CBC  WBC 4.0 - 10.5 K/uL 10.2  8.5  9.1   Hemoglobin 13.0 - 17.0 g/dL 16.1  9.9  09.6   Hematocrit 39.0 -  52.0 % 35.9  32.2  33.0   Platelets 150 - 400 K/uL 348  469  497        Latest Ref Rng & Units 02/02/2023    3:44 AM 02/01/2023    3:34 AM 01/31/2023    4:29 AM  CMP  Glucose 70 - 99 mg/dL 86   92   BUN 8 - 23 mg/dL 10   7   Creatinine 0.45 - 1.24 mg/dL 4.09  8.11  9.14   Sodium 135 - 145 mmol/L 140   140   Potassium 3.5 - 5.1 mmol/L 4.0   3.8   Chloride 98 - 111 mmol/L 103   104   CO2 22 - 32 mmol/L 28   29   Calcium 8.9 - 10.3 mg/dL 8.4  8.2      ? Impression/Recommendation Chronic left ischial decubitus ulcer was recently infected Took Levaquin for 2 weeks Prior to that he was getting IV vancomycin and meropenem in the hospital for a few days. He followed up at the wound clinic and I went to the wound clinic reviewed the pictures and also discussed with Clinician Leonard Schwartz There is no soft tissue infection currently No need to treat any further with antibiotics for  chronic osteomyelitis of underlying bone which is unlikely to heal Topical care is adequate  Patient has diverting colostomy  Paraplegia secondary to spinal injury  Patient will get ESR and CRP I will see him in the wound clinic at his next visit in 2 weeks. ___________________________________________________ Discussed with patient, and with Mr.Hoyt Note:  This document was prepared using Dragon voice recognition software and may include unintentional dictation errors.

## 2023-03-07 DIAGNOSIS — M86152 Other acute osteomyelitis, left femur: Secondary | ICD-10-CM | POA: Diagnosis not present

## 2023-03-07 DIAGNOSIS — A419 Sepsis, unspecified organism: Secondary | ICD-10-CM | POA: Diagnosis not present

## 2023-03-07 DIAGNOSIS — I10 Essential (primary) hypertension: Secondary | ICD-10-CM | POA: Diagnosis not present

## 2023-03-07 DIAGNOSIS — L89223 Pressure ulcer of left hip, stage 3: Secondary | ICD-10-CM | POA: Diagnosis not present

## 2023-03-07 DIAGNOSIS — M8618 Other acute osteomyelitis, other site: Secondary | ICD-10-CM | POA: Diagnosis not present

## 2023-03-07 DIAGNOSIS — L89893 Pressure ulcer of other site, stage 3: Secondary | ICD-10-CM | POA: Diagnosis not present

## 2023-03-07 DIAGNOSIS — S24103S Unspecified injury at T7-T10 level of thoracic spinal cord, sequela: Secondary | ICD-10-CM | POA: Diagnosis not present

## 2023-03-07 DIAGNOSIS — L89153 Pressure ulcer of sacral region, stage 3: Secondary | ICD-10-CM | POA: Diagnosis not present

## 2023-03-07 DIAGNOSIS — G8220 Paraplegia, unspecified: Secondary | ICD-10-CM | POA: Diagnosis not present

## 2023-03-10 DIAGNOSIS — M8618 Other acute osteomyelitis, other site: Secondary | ICD-10-CM | POA: Diagnosis not present

## 2023-03-10 DIAGNOSIS — I10 Essential (primary) hypertension: Secondary | ICD-10-CM | POA: Diagnosis not present

## 2023-03-10 DIAGNOSIS — L89893 Pressure ulcer of other site, stage 3: Secondary | ICD-10-CM | POA: Diagnosis not present

## 2023-03-10 DIAGNOSIS — M86152 Other acute osteomyelitis, left femur: Secondary | ICD-10-CM | POA: Diagnosis not present

## 2023-03-10 DIAGNOSIS — S24103S Unspecified injury at T7-T10 level of thoracic spinal cord, sequela: Secondary | ICD-10-CM | POA: Diagnosis not present

## 2023-03-10 DIAGNOSIS — L89153 Pressure ulcer of sacral region, stage 3: Secondary | ICD-10-CM | POA: Diagnosis not present

## 2023-03-10 DIAGNOSIS — G8220 Paraplegia, unspecified: Secondary | ICD-10-CM | POA: Diagnosis not present

## 2023-03-10 DIAGNOSIS — A419 Sepsis, unspecified organism: Secondary | ICD-10-CM | POA: Diagnosis not present

## 2023-03-10 DIAGNOSIS — L89223 Pressure ulcer of left hip, stage 3: Secondary | ICD-10-CM | POA: Diagnosis not present

## 2023-03-13 ENCOUNTER — Other Ambulatory Visit
Admission: RE | Admit: 2023-03-13 | Discharge: 2023-03-13 | Disposition: A | Discharge status: Home / Self Care | Payer: Medicare HMO | Attending: Infectious Diseases | Admitting: Infectious Diseases

## 2023-03-13 ENCOUNTER — Telehealth: Payer: Self-pay

## 2023-03-13 ENCOUNTER — Other Ambulatory Visit: Payer: Self-pay

## 2023-03-13 DIAGNOSIS — L89153 Pressure ulcer of sacral region, stage 3: Secondary | ICD-10-CM | POA: Diagnosis not present

## 2023-03-13 LAB — SEDIMENTATION RATE: Sed Rate: 118 mm/h — ABNORMAL HIGH (ref 0–20)

## 2023-03-13 LAB — C-REACTIVE PROTEIN: CRP: 13.4 mg/dL — ABNORMAL HIGH (ref <1.0)

## 2023-03-13 LAB — COMPREHENSIVE METABOLIC PANEL
ALT: 10 U/L (ref 0–44)
AST: 16 U/L (ref 15–41)
Albumin: 2.9 g/dL — ABNORMAL LOW (ref 3.5–5.0)
Alkaline Phosphatase: 64 U/L (ref 38–126)
Anion gap: 12 (ref 5–15)
BUN: 13 mg/dL (ref 8–23)
CO2: 29 mmol/L (ref 22–32)
Calcium: 8.5 mg/dL — ABNORMAL LOW (ref 8.9–10.3)
Chloride: 96 mmol/L — ABNORMAL LOW (ref 98–111)
Creatinine, Ser: 0.45 mg/dL — ABNORMAL LOW (ref 0.61–1.24)
GFR, Estimated: >60 mL/min (ref >60)
Glucose, Bld: 91 mg/dL (ref 70–99)
Potassium: 2.6 mmol/L — CL (ref 3.5–5.1)
Sodium: 137 mmol/L (ref 135–145)
Total Bilirubin: 0.8 mg/dL (ref 0.3–1.2)
Total Protein: 7.8 g/dL (ref 6.5–8.1)

## 2023-03-13 MED ORDER — POTASSIUM CHLORIDE CRYS ER 20 MEQ PO TBCR: 20.0000 meq | EXTENDED_RELEASE_TABLET | Freq: Two times a day (BID) | ORAL | 0 refills | Status: DC

## 2023-03-13 NOTE — Telephone Encounter (Signed)
Judeth Cornfield with [facility] lab called to report critical lab value.  K+ 2.6 Per Dr. Rivka Safer she would like the patient to take 20 meq BID x 3 days and follow up with pcp. Patient informed and verbalized understanding.  Rx sent Davy Faught T Pricilla Loveless

## 2023-03-15 ENCOUNTER — Encounter: Payer: Self-pay | Admitting: Infectious Diseases

## 2023-03-15 ENCOUNTER — Encounter: Payer: Medicare HMO | Attending: Internal Medicine | Admitting: Internal Medicine

## 2023-03-15 DIAGNOSIS — I10 Essential (primary) hypertension: Secondary | ICD-10-CM | POA: Diagnosis not present

## 2023-03-15 DIAGNOSIS — Z933 Colostomy status: Secondary | ICD-10-CM | POA: Diagnosis not present

## 2023-03-15 DIAGNOSIS — D509 Iron deficiency anemia, unspecified: Secondary | ICD-10-CM | POA: Insufficient documentation

## 2023-03-15 DIAGNOSIS — L89153 Pressure ulcer of sacral region, stage 3: Secondary | ICD-10-CM | POA: Insufficient documentation

## 2023-03-15 DIAGNOSIS — L89154 Pressure ulcer of sacral region, stage 4: Secondary | ICD-10-CM | POA: Insufficient documentation

## 2023-03-15 DIAGNOSIS — G8220 Paraplegia, unspecified: Secondary | ICD-10-CM | POA: Diagnosis not present

## 2023-03-15 DIAGNOSIS — M069 Rheumatoid arthritis, unspecified: Secondary | ICD-10-CM | POA: Insufficient documentation

## 2023-03-15 DIAGNOSIS — L89894 Pressure ulcer of other site, stage 4: Secondary | ICD-10-CM | POA: Insufficient documentation
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
## 2023-03-20 DIAGNOSIS — G8220 Paraplegia, unspecified: Secondary | ICD-10-CM | POA: Diagnosis not present

## 2023-03-20 DIAGNOSIS — L89223 Pressure ulcer of left hip, stage 3: Secondary | ICD-10-CM | POA: Diagnosis not present

## 2023-03-20 DIAGNOSIS — S24103S Unspecified injury at T7-T10 level of thoracic spinal cord, sequela: Secondary | ICD-10-CM | POA: Diagnosis not present

## 2023-03-20 DIAGNOSIS — M86152 Other acute osteomyelitis, left femur: Secondary | ICD-10-CM | POA: Diagnosis not present

## 2023-03-20 DIAGNOSIS — L89153 Pressure ulcer of sacral region, stage 3: Secondary | ICD-10-CM | POA: Diagnosis not present

## 2023-03-20 DIAGNOSIS — L89893 Pressure ulcer of other site, stage 3: Secondary | ICD-10-CM | POA: Diagnosis not present

## 2023-03-20 DIAGNOSIS — I10 Essential (primary) hypertension: Secondary | ICD-10-CM | POA: Diagnosis not present

## 2023-03-20 DIAGNOSIS — M8618 Other acute osteomyelitis, other site: Secondary | ICD-10-CM | POA: Diagnosis not present

## 2023-03-20 DIAGNOSIS — A419 Sepsis, unspecified organism: Secondary | ICD-10-CM | POA: Diagnosis not present

## 2023-03-22 ENCOUNTER — Encounter: Payer: Medicare HMO | Admitting: Internal Medicine

## 2023-03-22 DIAGNOSIS — L89894 Pressure ulcer of other site, stage 4: Secondary | ICD-10-CM | POA: Diagnosis not present

## 2023-03-22 DIAGNOSIS — Z933 Colostomy status: Secondary | ICD-10-CM | POA: Diagnosis not present

## 2023-03-22 DIAGNOSIS — L89159 Pressure ulcer of sacral region, unspecified stage: Secondary | ICD-10-CM | POA: Diagnosis not present

## 2023-03-22 DIAGNOSIS — G8220 Paraplegia, unspecified: Secondary | ICD-10-CM | POA: Diagnosis not present

## 2023-03-22 DIAGNOSIS — L89154 Pressure ulcer of sacral region, stage 4: Secondary | ICD-10-CM | POA: Diagnosis not present

## 2023-03-22 DIAGNOSIS — D509 Iron deficiency anemia, unspecified: Secondary | ICD-10-CM | POA: Diagnosis not present

## 2023-03-22 DIAGNOSIS — M069 Rheumatoid arthritis, unspecified: Secondary | ICD-10-CM | POA: Diagnosis not present

## 2023-03-22 DIAGNOSIS — I10 Essential (primary) hypertension: Secondary | ICD-10-CM | POA: Diagnosis not present

## 2023-03-22 DIAGNOSIS — L89153 Pressure ulcer of sacral region, stage 3: Secondary | ICD-10-CM | POA: Diagnosis not present

## 2023-03-22 DIAGNOSIS — S71102A Unspecified open wound, left thigh, initial encounter: Secondary | ICD-10-CM | POA: Diagnosis not present

## 2023-03-24 NOTE — Progress Notes (Signed)
4.5 Depth: (cm) 5 Area: (cm) 31.809 Volume: (cm) 159.043 % Reduction in Area: 38.3% % Reduction in Volume: 48.6% Epithelialization: None Wound Description Classification: Category/Stage IV Wound Margin: Epibole Exudate Amount: Medium Exudate Type: Serosanguineous Exudate Color: red, brown Foul Odor After Cleansing: No Slough/Fibrino Yes Wound Bed Granulation Amount: Large (67-100%) Exposed Structure Granulation Quality: Red, Pink Fascia Exposed: No Necrotic Amount: Small (1-33%) Fat Layer  (Subcutaneous Tissue) Exposed: Yes Necrotic Quality: Adherent Slough Tendon Exposed: No Muscle Exposed: Yes Necrosis of Muscle: No Joint Exposed: No Bone Exposed: No Treatment Notes Wound #10 (Sacrum) Wound Laterality: Midline Cleanser Dakin 16 (oz) 0.25 Discharge Instruction: Use as directed. NOAAH, SCHUELE ([id number]) [id number] Page 8 of 10 Peri-Wound Care Topical Primary Dressing Gauze Discharge Instruction: Moisten 2-3 gauze with Dakins and gently pack into wound Secondary Dressing Zetuvit Plus 4x8 (in/in) Secured With Medipore T - 38M Medipore H Soft Cloth Surgical T ape ape, 2x2 (in/yd) Compression Wrap Compression Stockings Add-Ons Electronic Signature(s) Signed: 03/23/2023 5:20:54 PM By: Bradley [name] Signed: 03/24/2023 11:41:18 AM By: Bradley Pax RN Entered By: Bradley [name] on 03/22/2023 08:43:43 -------------------------------------------------------------------------------- Wound Assessment Details Patient Name: Date of Service: Bradley Pink [name] 03/22/2023 11:00 [name] Medical Record Number: [id number] Patient Account Number: [id number] Date of Birth/Sex: Treating RN: Sep 30, 1945 (77 y.o. Bradley [name]) Bradley [name] Primary Care Bradley [name]: Bradley [name] Other Clinician: Betha [name] Referring Bradley [name]: Treating Bradley [name]/Extender: RO BSO N, Bradley [name] Weeks in Treatment: 192 Wound Status Wound Number: 14 Primary Pressure Ulcer Etiology: Wound Location: Left, Posterior Upper Leg Wound Open Wounding Event: Shear/Friction Status: Date Acquired: 11/05/2022 Comorbid Anemia, Hypertension, History of pressure wounds, Weeks Of Treatment: 18 History: Rheumatoid Arthritis, Paraplegia Clustered Wound: Yes Photos Wound Measurements Length: (cm) 5.5 Width: (cm) 2.2 Depth: (cm) 2.7 Clustered Quantity: 2 Area: (cm) 9.503 Bradley [name], Bradley [name] (Bradley [name]) Volume: (cm) 25.659 % Reduction in Area: -348% % Reduction in Volume:  -122085.7% Epithelialization: None Undermining: Yes Starting Position (o'clock): 12 [id number] Page 9 of 10 Ending Position (o'clock): 3 Maximum Distance: (cm) 7 Wound Description Classification: Category/Stage IV Wound Margin: Thickened Exudate Amount: Medium Exudate Type: Serous Exudate Color: amber Foul Odor After Cleansing: No Slough/Fibrino No Wound Bed Granulation Amount: Large (67-100%) Exposed Structure Granulation Quality: Pink Fascia Exposed: No Necrotic Amount: None Present (0%) Fat Layer (Subcutaneous Tissue) Exposed: Yes Tendon Exposed: No Muscle Exposed: No Joint Exposed: No Bone Exposed: Yes Treatment Notes Wound #14 (Upper Leg) Wound Laterality: Left, Posterior Cleanser Dakin 16 (oz) 0.25 Discharge Instruction: Use as directed. Peri-Wound Care Topical Primary Dressing Gauze Discharge Instruction: Moisten 2-3 gauze with Dakins and gently pack into wound and tunnel Secondary Dressing Zetuvit Plus 4x4 (in/in) Secured With Medipore T - 38M Medipore H Soft Cloth Surgical T ape ape, 2x2 (in/yd) Compression Wrap Compression Stockings Add-Ons Electronic Signature(s) Signed: 03/23/2023 5:20:54 PM By: Bradley [name] Signed: 03/24/2023 11:41:18 AM By: Bradley Pax RN Entered By: Bradley [name] on 03/22/2023 08:59:31 -------------------------------------------------------------------------------- Vitals Details Patient Name: Date of Service: Bradley Pink [name] 03/22/2023 11:00 [name] Medical Record Number: [id number] Patient Account Number: [id number] Date of Birth/Sex: Treating RN: Mar 16, 1946 (77 y.o. Bradley [name]) Bradley [name] Primary Care Lain Tetterton: Bradley [name] Other Clinician: Betha [name] Referring Vincen Bejar: Treating Colby Reels/Extender: RO BSO N, Bradley [name] Weeks in Treatment: 192 Vital Signs Bradley [name], Bradley [name] (Bradley [name]) [id number] Page 10 of 10 Time Taken: 11:40 Temperature (F):  97.7 Pulse (bpm): 120 Respiratory Rate (breaths/min): 18 Blood Pressure (mmHg): 123/72 Reference Range: 80 - 120 mg / dl  ARKELL, FEIGE ([id number]) [id number] Page 1 of 10 Visit Report for 03/22/2023 Arrival Information Details Patient Name: Date of Service: Bradley Pink [name] 03/22/2023 11:00 [name] Medical Record Number: [id number] Patient Account Number: [id number] Date of Birth/Sex: Treating RN: 29-Jan-1946 (77 y.o. Bradley [name]) Bradley [name] Primary Care Naileah Karg: Bradley [name] Other Clinician: Betha [name] Referring Celestine Prim: Treating Naomia Lenderman/Extender: RO BSO N, Bradley EL Burnard Bunting in Treatment: 192 Visit Information History Since Last Visit All ordered tests and consults were completed: No Patient Arrived: Wheel Chair Added or deleted any medications: No Arrival Time: 11:39 Any new allergies or adverse reactions: No Transfer Assistance: None Had [name] fall or experienced change in No Patient Requires Transmission-Based Precautions: No activities of daily living that may affect Patient Has Alerts: No risk of falls: Signs or symptoms of abuse/neglect since last visito No Hospitalized since last visit: No Implantable device outside of the clinic excluding No cellular tissue based products placed in the center since last visit: Has Dressing in Place as Prescribed: Yes Pain Present Now: No Electronic Signature(s) Signed: 03/23/2023 5:20:54 PM By: Bradley [name] Entered By: Bradley [name] on 03/22/2023 08:39:40 -------------------------------------------------------------------------------- Clinic Level of Care Assessment Details Patient Name: Date of Service: Bradley Pink [name] 03/22/2023 11:00 [name] Medical Record Number: [id number] Patient Account Number: [id number] Date of Birth/Sex: Treating RN: July 04, 1945 (77 y.o. Bradley [name]) Bradley [name] Primary Care Kenleigh Toback: Bradley [name] Other Clinician: Betha [name] Referring Samella Lucchetti: Treating Cheyenne Schumm/Extender: RO BSO N, Bradley EL Burnard Bunting in Treatment: 192 Clinic Level of Care Assessment  Items TOOL 4 Quantity Score []  - 0 Use when only an EandM is performed on FOLLOW-UP visit ASSESSMENTS - Nursing Assessment / Reassessment X- 1 10 Reassessment of Co-morbidities (includes updates in patient status) X- 1 5 Reassessment of Adherence to Treatment Plan JAHMAIR, CARRAHER [name] ([id number]) [id number] Page 2 of 10 ASSESSMENTS - Wound and Skin [name] ssessment / Reassessment []  - 0 Simple Wound Assessment / Reassessment - one wound X- 2 5 Complex Wound Assessment / Reassessment - multiple wounds []  - 0 Dermatologic / Skin Assessment (not related to wound area) ASSESSMENTS - Focused Assessment []  - 0 Circumferential Edema Measurements - multi extremities []  - 0 Nutritional Assessment / Counseling / Intervention []  - 0 Lower Extremity Assessment (monofilament, tuning fork, pulses) []  - 0 Peripheral Arterial Disease Assessment (using hand held doppler) ASSESSMENTS - Ostomy and/or Continence Assessment and Care []  - 0 Incontinence Assessment and Management []  - 0 Ostomy Care Assessment and Management (repouching, etc.) PROCESS - Coordination of Care X - Simple Patient / Family Education for ongoing care 1 15 []  - 0 Complex (extensive) Patient / Family Education for ongoing care []  - 0 Staff obtains Chiropractor, Records, T Results / Process Orders est []  - 0 Staff telephones HHA, Nursing Homes / Clarify orders / etc []  - 0 Routine Transfer to another Facility (non-emergent condition) []  - 0 Routine Hospital Admission (non-emergent condition) []  - 0 New Admissions / Manufacturing engineer / Ordering NPWT Apligraf, etc. , []  - 0 Emergency Hospital Admission (emergent condition) X- 1 10 Simple Discharge Coordination []  - 0 Complex (extensive) Discharge Coordination PROCESS - Special Needs []  - 0 Pediatric / Minor Patient Management []  - 0 Isolation Patient Management []  - 0 Hearing / Language / Visual special needs []  - 0 Assessment of  Community assistance (transportation, D/C planning, etc.) []  - 0 Additional assistance / Altered mentation []  - 0 Support Surface(s) Assessment (bed, cushion, seat, etc.) INTERVENTIONS -  Wound Cleansing / Measurement X - Simple Wound Cleansing - one wound 1 5 []  - 0 Complex Wound Cleansing - multiple wounds []  - 0 Wound Imaging (photographs - any number of wounds) []  - 0 Wound Tracing (instead of photographs) []  - 0 Simple Wound Measurement - one wound []  - 0 Complex Wound Measurement - multiple wounds INTERVENTIONS - Wound Dressings []  - 0 Small Wound Dressing one or multiple wounds X- 2 15 Medium Wound Dressing one or multiple wounds []  - 0 Large Wound Dressing one or multiple wounds []  - 0 Application of Medications - topical []  - 0 Application of Medications - injection INTERVENTIONS - Miscellaneous []  - 0 External ear exam Bradley [name], Bradley [name] (Bradley [name]) [id number] Page 3 of 10 []  - 0 Specimen Collection (cultures, biopsies, blood, body fluids, etc.) []  - 0 Specimen(s) / Culture(s) sent or taken to Lab for analysis X- 1 10 Patient Transfer (multiple staff / Michiel Sites Lift / Similar devices) []  - 0 Simple Staple / Suture removal (25 or less) []  - 0 Complex Staple / Suture removal (26 or more) []  - 0 Hypo / Hyperglycemic Management (close monitor of Blood Glucose) []  - 0 Ankle / Brachial Index (ABI) - do not check if billed separately X- 1 5 Vital Signs Has the patient been seen at the hospital within the last three years: Yes Total Score: 100 Level Of Care: New/Established - Level 3 Electronic Signature(s) Signed: 03/23/2023 5:20:54 PM By: Bradley [name] Entered By: Bradley [name] on 03/22/2023 09:02:45 -------------------------------------------------------------------------------- Encounter Discharge Information Details Patient Name: Date of Service: Bradley Pink [name] 03/22/2023 11:00 [name] Medical Record Number: [id number] Patient  Account Number: [id number] Date of Birth/Sex: Treating RN: [birth year] (77 y.o. [name] Primary Care Cierra Rothgeb: Bradley [name] Other Clinician: Betha [name] Referring Chiquita Heckert: Treating Stefanos Haynesworth/Extender: RO BSO N, Bradley EL Burnard Bunting in Treatment: 192 Encounter Discharge Information Items Discharge Condition: Stable Ambulatory Status: Wheelchair Discharge Destination: Home Transportation: Other Accompanied By: self Schedule Follow-up Appointment: Yes Clinical Summary of Care: Electronic Signature(s) Signed: 03/23/2023 5:20:54 PM By: Bradley [name] Entered By: Bradley [name] on 03/22/2023 10:15:52 Lower Extremity Assessment Details -------------------------------------------------------------------------------- Bradley [name] (Bradley [name]) [id number] Page 4 of 10 Patient Name: Date of Service: Bradley Pink [name] 03/22/2023 11:00 [name] Medical Record Number: [id number] Patient Account Number: [id number] Date of Birth/Sex: Treating RN: Sep 21, 1945 (77 y.o. Bradley [name]) Bradley [name] Primary Care [place]: Bradley [name] Other Clinician: Betha [name] Referring Quiera Diffee: Treating Devlyn Retter/Extender: RO BSO N, Bradley [name] Weeks in Treatment: 192 Electronic Signature(s) Signed: 03/23/2023 5:20:54 PM By: Bradley [name] Signed: 03/24/2023 11:41:18 AM By: Bradley Pax RN Entered By: Bradley [name] on 03/22/2023 08:44:32 -------------------------------------------------------------------------------- Multi Wound Chart Details Patient Name: Date of Service: Bradley Pink [name] 03/22/2023 11:00 [name] Medical Record Number: [id number] Patient Account Number: [id number] Date of Birth/Sex: Treating RN: [birth year] (77 y.o. Bradley [name]) Bradley [name] Primary Care Joleene Burnham: Bradley [name] Other Clinician: Betha [name] Referring Montanna Mcbain: Treating Averill Winters/Extender: RO BSO N, Bradley [name] Weeks in Treatment: 192 Vital  Signs Height(in): Pulse(bpm): 120 Weight(lbs): Blood Pressure(mmHg): 123/72 Body Mass Index(BMI): Temperature(F): 97.7 Respiratory Rate(breaths/min): 18 [10:Photos:] [[name]:[name]] Midline Sacrum Left, Posterior Upper Leg [name] Wound Location: Pressure Injury Shear/Friction [name] Wounding Event: Pressure Ulcer Pressure Ulcer [name] Primary Etiology: Anemia, Hypertension, History of Anemia, Hypertension, History of [name] Comorbid History: pressure wounds, Rheumatoid Arthritis, pressure wounds, Rheumatoid Arthritis, Paraplegia Paraplegia 06/07/2015 11/05/2022 [name] Date Acquired: 192 18 [name] Weeks of Treatment: Open Open  4.5 Depth: (cm) 5 Area: (cm) 31.809 Volume: (cm) 159.043 % Reduction in Area: 38.3% % Reduction in Volume: 48.6% Epithelialization: None Wound Description Classification: Category/Stage IV Wound Margin: Epibole Exudate Amount: Medium Exudate Type: Serosanguineous Exudate Color: red, brown Foul Odor After Cleansing: No Slough/Fibrino Yes Wound Bed Granulation Amount: Large (67-100%) Exposed Structure Granulation Quality: Red, Pink Fascia Exposed: No Necrotic Amount: Small (1-33%) Fat Layer  (Subcutaneous Tissue) Exposed: Yes Necrotic Quality: Adherent Slough Tendon Exposed: No Muscle Exposed: Yes Necrosis of Muscle: No Joint Exposed: No Bone Exposed: No Treatment Notes Wound #10 (Sacrum) Wound Laterality: Midline Cleanser Dakin 16 (oz) 0.25 Discharge Instruction: Use as directed. NOAAH, SCHUELE ([id number]) [id number] Page 8 of 10 Peri-Wound Care Topical Primary Dressing Gauze Discharge Instruction: Moisten 2-3 gauze with Dakins and gently pack into wound Secondary Dressing Zetuvit Plus 4x8 (in/in) Secured With Medipore T - 38M Medipore H Soft Cloth Surgical T ape ape, 2x2 (in/yd) Compression Wrap Compression Stockings Add-Ons Electronic Signature(s) Signed: 03/23/2023 5:20:54 PM By: Bradley [name] Signed: 03/24/2023 11:41:18 AM By: Bradley Pax RN Entered By: Bradley [name] on 03/22/2023 08:43:43 -------------------------------------------------------------------------------- Wound Assessment Details Patient Name: Date of Service: Bradley Pink [name] 03/22/2023 11:00 [name] Medical Record Number: [id number] Patient Account Number: [id number] Date of Birth/Sex: Treating RN: Sep 30, 1945 (77 y.o. Bradley [name]) Bradley [name] Primary Care Bradley [name]: Bradley [name] Other Clinician: Betha [name] Referring Bradley [name]: Treating Bradley [name]/Extender: RO BSO N, Bradley [name] Weeks in Treatment: 192 Wound Status Wound Number: 14 Primary Pressure Ulcer Etiology: Wound Location: Left, Posterior Upper Leg Wound Open Wounding Event: Shear/Friction Status: Date Acquired: 11/05/2022 Comorbid Anemia, Hypertension, History of pressure wounds, Weeks Of Treatment: 18 History: Rheumatoid Arthritis, Paraplegia Clustered Wound: Yes Photos Wound Measurements Length: (cm) 5.5 Width: (cm) 2.2 Depth: (cm) 2.7 Clustered Quantity: 2 Area: (cm) 9.503 Bradley [name], Bradley [name] (Bradley [name]) Volume: (cm) 25.659 % Reduction in Area: -348% % Reduction in Volume:  -122085.7% Epithelialization: None Undermining: Yes Starting Position (o'clock): 12 [id number] Page 9 of 10 Ending Position (o'clock): 3 Maximum Distance: (cm) 7 Wound Description Classification: Category/Stage IV Wound Margin: Thickened Exudate Amount: Medium Exudate Type: Serous Exudate Color: amber Foul Odor After Cleansing: No Slough/Fibrino No Wound Bed Granulation Amount: Large (67-100%) Exposed Structure Granulation Quality: Pink Fascia Exposed: No Necrotic Amount: None Present (0%) Fat Layer (Subcutaneous Tissue) Exposed: Yes Tendon Exposed: No Muscle Exposed: No Joint Exposed: No Bone Exposed: Yes Treatment Notes Wound #14 (Upper Leg) Wound Laterality: Left, Posterior Cleanser Dakin 16 (oz) 0.25 Discharge Instruction: Use as directed. Peri-Wound Care Topical Primary Dressing Gauze Discharge Instruction: Moisten 2-3 gauze with Dakins and gently pack into wound and tunnel Secondary Dressing Zetuvit Plus 4x4 (in/in) Secured With Medipore T - 38M Medipore H Soft Cloth Surgical T ape ape, 2x2 (in/yd) Compression Wrap Compression Stockings Add-Ons Electronic Signature(s) Signed: 03/23/2023 5:20:54 PM By: Bradley [name] Signed: 03/24/2023 11:41:18 AM By: Bradley Pax RN Entered By: Bradley [name] on 03/22/2023 08:59:31 -------------------------------------------------------------------------------- Vitals Details Patient Name: Date of Service: Bradley Pink [name] 03/22/2023 11:00 [name] Medical Record Number: [id number] Patient Account Number: [id number] Date of Birth/Sex: Treating RN: Mar 16, 1946 (77 y.o. Bradley [name]) Bradley [name] Primary Care Lain Tetterton: Bradley [name] Other Clinician: Betha [name] Referring Vincen Bejar: Treating Colby Reels/Extender: RO BSO N, Bradley [name] Weeks in Treatment: 192 Vital Signs Bradley [name], Bradley [name] (Bradley [name]) [id number].pdf Page 10 of 10 Time Taken: 11:40 Temperature (F):  97.7 Pulse (bpm): 120 Respiratory Rate (breaths/min): 18 Blood Pressure (mmHg): 123/72 Reference Range: 80 - 120 mg / dl  [name] Wound Status: No No [name] Wound Recurrence: No Yes [name] Clustered Wound: [name] 2 [name] Clustered Quantity: 9x4.5x5 5.5x2.2x2.7 [name] Measurements L x W x D (cm) 31.809 9.503 [name] [name] (cm) : rea 159.043 25.659 [name] Volume (cm) : 38.30% -348.00% [name] % Reduction in [name] rea: 48.60% -122085.70% [name] % Reduction in Volume: Category/Stage IV Category/Stage IV [name] Classification: Medium Medium [name] Exudate [name] mount: Serosanguineous Serous [name] Exudate Type: red, brown amber [name] Exudate Color: Epibole Thickened [name] Wound Margin: Large (67-100%) Large (67-100%) [name] Granulation [name] mount: Red, Pink Pink [name] Granulation QualityESTEFANO, CROMBIE ([id number]) [id number] Page 5 of 10 Small (1-33%) None Present (0%) [name] Necrotic Amount: Fat Layer (Subcutaneous Tissue): Yes Fat Layer (Subcutaneous Tissue): Yes [name] Exposed Structures: Muscle: Yes Bone: Yes Fascia: No Fascia: No Tendon: No Tendon: No Joint: No Muscle: No Bone: No Joint: No None None [name] Epithelialization: Treatment Notes Electronic Signature(s) Signed: 03/23/2023 5:20:54 PM By: Bradley [name] Entered By: Bradley [name] on 03/22/2023 08:57:38 -------------------------------------------------------------------------------- Multi-Disciplinary Care Plan Details Patient Name: Date of  Service: Bradley Pink [name] 03/22/2023 11:00 [name] Medical Record Number: [id number] Patient Account Number: [id number] Date of Birth/Sex: Treating RN: 02-25-1946 (77 y.o. Bradley [name]) Bradley [name] Primary Care Weston Kallman: Bradley [name] Other Clinician: Betha [name] Referring Mattalynn Crandle: Treating Chabely Norby/Extender: RO BSO N, Bradley [name] Weeks in Treatment: 192 Active Inactive Electronic Signature(s) Signed: 03/23/2023 5:20:54 PM By: Bradley [name] Signed: 03/24/2023 11:41:18 AM By: Bradley Pax RN Entered By: Bradley [name] on 03/22/2023 09:03:01 -------------------------------------------------------------------------------- Pain Assessment Details Patient Name: Date of Service: Bradley Pink [name] 03/22/2023 11:00 [name] Medical Record Number: [id number] Patient Account Number: [id number] Date of Birth/Sex: Treating RN: 12-31-1945 (77 y.o. [name] Primary Care Dushaun Okey: Bradley [name] Other Clinician: Betha [name] Referring Kataleia Quaranta: Treating Reynold Mantell/Extender: RO BSO N, Bradley [name] Weeks in Treatment: 192 Active Problems Location of Pain Severity and Description of Pain Patient Has Paino No Site Locations SHYON, SLAGTER [name] ([id number]) [id number].pdf Page 6 of 10 Pain Management and Medication Current Pain Management: Electronic Signature(s) Signed: 03/23/2023 5:20:54 PM By: Bradley [name] Signed: 03/24/2023 11:41:18 AM By: Bradley Pax RN Entered By: Bradley [name] on 03/22/2023 08:40:44 -------------------------------------------------------------------------------- Patient/Caregiver Education Details Patient Name: Date of Service: Bradley Pink [name] 10/16/2024andnbsp11:00 [name] Medical Record Number: [id number] Patient Account Number: [id number] Date of Birth/Gender: Treating RN: [birth year] (77 y.o. [name] Primary Care Physician: Bradley [name] Other Clinician: Betha [name] Referring  Physician: Treating Physician/Extender: RO BSO Dorris Carnes, Bradley EL Burnard Bunting in Treatment: 192 Education Assessment Education Provided To: Patient Education Topics Provided Wound/Skin Impairment: Handouts: Other: continue wound care as directed Methods: Explain/Verbal Responses: State content correctly Electronic Signature(s) Signed: 03/23/2023 5:20:54 PM By: Bradley [name] Entered By: Bradley [name] on 03/22/2023 10:14:42 Bradley [name] ([id number]) [id number].pdf Page 7 of 10 -------------------------------------------------------------------------------- Wound Assessment Details Patient Name: Date of Service: Bradley Pink [name] 03/22/2023 11:00 [name] Medical Record Number: [id number] Patient Account Number: [id number] Date of Birth/Sex: Treating RN: 10-May-1946 (77 y.o. Bradley [name]) Bradley [name] Primary Care Seema Blum: Bradley [name] Other Clinician: Betha [name] Referring Lucrezia Dehne: Treating Walda Hertzog/Extender: RO BSO N, Bradley [name] Weeks in Treatment: 192 Wound Status Wound Number: 10 Primary Pressure Ulcer Etiology: Wound Location: Midline Sacrum Wound Open Wounding Event: Pressure Injury Status: Date Acquired: 06/07/2015 Comorbid Anemia, Hypertension, History of pressure wounds, Weeks Of Treatment: 192 History: Rheumatoid Arthritis, Paraplegia Clustered Wound: No Photos Wound Measurements Length: (cm) 9 Width: (cm)

## 2023-03-24 NOTE — Progress Notes (Signed)
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 issues/requests for orders to patient's Primary Care Physician. **If current dressing causes regression in wound condition, may D/C ordered dressing product/s and apply Normal Saline Moist Dressing daily until next Wound Healing Center or Other MD appointment. **Notify Wound Healing Center of regression in wound condition at [phone]. Other Home Health Orders/Instructions: Bathing/ Shower/ Hygiene May shower; gently cleanse wound with antibacterial soap, rinse and pat dry prior to dressing wounds Off-Loading Roho cushion for wheelchair Low air-loss mattress (Group 2) Turn and reposition every 2 hours Wound Treatment Wound #10 - Sacrum Wound Laterality: Midline Cleanser: Dakin 16 (oz) 0.25 1 x Per Day/30 Days Discharge Instructions: Use as directed. Prim Dressing: Gauze (Generic) 1 x Per Day/30 Days ary Discharge Instructions: Moisten 2-3 gauze with Dakins and gently pack into wound Secondary Dressing: Zetuvit Plus 4x8 (in/in) 1 x Per Day/30 Days Secured With: Medipore T - 52M Medipore H Soft Cloth Surgical T ape ape, 2x2 (in/yd) (Generic) 1 x Per Day/30 Days Bradley Bradley [name], Bradley Bradley [name] ([id number])  [id number] Page 7 of 14 Wound #14 - Upper Leg Wound Laterality: Left, Posterior Cleanser: Dakin 16 (oz) 0.25 1 x Per Day/30 Days Discharge Instructions: Use as directed.

## 2023-03-29 DIAGNOSIS — E876 Hypokalemia: Secondary | ICD-10-CM | POA: Diagnosis not present

## 2023-03-31 DIAGNOSIS — A419 Sepsis, unspecified organism: Secondary | ICD-10-CM | POA: Diagnosis not present

## 2023-03-31 DIAGNOSIS — L89153 Pressure ulcer of sacral region, stage 3: Secondary | ICD-10-CM | POA: Diagnosis not present

## 2023-03-31 DIAGNOSIS — M8618 Other acute osteomyelitis, other site: Secondary | ICD-10-CM | POA: Diagnosis not present

## 2023-03-31 DIAGNOSIS — L89223 Pressure ulcer of left hip, stage 3: Secondary | ICD-10-CM | POA: Diagnosis not present

## 2023-03-31 DIAGNOSIS — S24103S Unspecified injury at T7-T10 level of thoracic spinal cord, sequela: Secondary | ICD-10-CM | POA: Diagnosis not present

## 2023-03-31 DIAGNOSIS — M86152 Other acute osteomyelitis, left femur: Secondary | ICD-10-CM | POA: Diagnosis not present

## 2023-03-31 DIAGNOSIS — L89893 Pressure ulcer of other site, stage 3: Secondary | ICD-10-CM | POA: Diagnosis not present

## 2023-03-31 DIAGNOSIS — I10 Essential (primary) hypertension: Secondary | ICD-10-CM | POA: Diagnosis not present

## 2023-03-31 DIAGNOSIS — G8220 Paraplegia, unspecified: Secondary | ICD-10-CM | POA: Diagnosis not present

## 2023-04-03 NOTE — Progress Notes (Signed)
Width: (cm) 3.5 Depth: (cm) 3 Area: (cm) 26.114 Volume: (cm) 78.343 % Reduction in Area: 49.3% % Reduction in Volume: 74.7% Epithelialization: None Wound Description Classification: Category/Stage IV Wound Margin: Epibole Exudate Amount: Medium Exudate Type: Serosanguineous Exudate Color: red, brown Foul Odor After Cleansing: No Slough/Fibrino Yes Wound Bed Granulation Amount: Large (67-100%)  Exposed Structure Granulation Quality: Red, [name] Fascia Exposed: No Necrotic Amount: Small (1-33%) Fat Layer (Subcutaneous Tissue) Exposed: Yes Necrotic Quality: Adherent Slough Tendon Exposed: No Muscle Exposed: Yes Necrosis of Muscle: No Joint Exposed: No Bone Exposed: No Electronic Signature(s) Signed: 03/15/2023 4:52:56 PM By: Bradley [name] Signed: 04/03/2023 8:01:26 AM By: Bradley Pax RN Entered By: Bradley [name] on 03/15/2023 10:29:21 Bradley [name] ([id number]) [id number] Page 8 of 9 -------------------------------------------------------------------------------- Wound Assessment Details Patient Name: Date of Service: Bradley [name] Bradley [name]. 03/15/2023 1:00 PM Medical Record Number: [id number] Patient Account Number: [id number] Date of Birth/Sex: Treating RN: 1945-08-29 (77 y.o. Bradley [name]) Bradley [name] Primary Care Bradley Wickliff: Bradley [name] Other Clinician: Betha [name] Referring Bradley [name]: Treating Bradley [name]: RO BSO N, Bradley [name] in Treatment: 191 Wound Status Wound Number: 14 Primary Pressure Ulcer Etiology: Wound Location: Left, Posterior Upper Leg Wound Open Wounding Event: Shear/Friction Status: Date Acquired: 11/05/2022 Comorbid Anemia, Hypertension, History of pressure wounds, [name] Of Treatment: 17 History: Rheumatoid Arthritis, Paraplegia Clustered Wound: Yes Photos Wound Measurements Length: (cm) 6 Width: (cm) 2 Depth: (cm) 3 Clustered Quantity: 2 Area: (cm) 9.425 Volume: (cm) 28.274 % Reduction in Area: -344.4% % Reduction in Volume: -134538.1% Epithelialization: None Wound Description Classification: Category/Stage IV Wound Margin: Thickened Exudate Amount: Medium Exudate Type: Serous Exudate Color: amber Foul Odor After Cleansing: No Slough/Fibrino No Wound Bed Granulation Amount: Large (67-100%) Exposed Structure Granulation Quality: [name] Fascia Exposed: No Necrotic Amount: None  Present (0%) Fat Layer (Subcutaneous Tissue) Exposed: Yes Tendon Exposed: No Muscle Exposed: No Joint Exposed: No Bone Exposed: Yes Electronic Signature(s) Signed: 03/15/2023 4:52:56 PM By: Bradley [name] Signed: 04/03/2023 8:01:26 AM By: Bradley Pax RN Entered By: Bradley [name] on 03/15/2023 10:32:15 Bradley [name] ([id number]) [id number] Page 9 of 9 -------------------------------------------------------------------------------- Vitals Details Patient Name: Date of Service: Bradley [name] Bradley [name]. 03/15/2023 1:00 PM Medical Record Number: [id number] Patient Account Number: [id number] Date of Birth/Sex: Treating RN: 01-18-1946 (77 y.o. Bradley [name]) Bradley [name] Primary Care Shamon Bradley [name]: Bradley [name] Other Clinician: Betha [name] Referring Tal Neer: Treating Indiah Heyden/Extender: RO BSO N, Bradley [name] in Treatment: 191 Vital Signs Time Taken: 13:07 Temperature (F): 98.0 Pulse (bpm): 76 Respiratory Rate (breaths/min): 18 Blood Pressure (mmHg): 117/72 Reference Range: 80 - 120 mg / dl Electronic Signature(s) Signed: 03/15/2023 4:52:56 PM By: Bradley [name] Entered By: Bradley [name] on 03/15/2023 10:09:57  Bradley [name], Bradley [name] ([id number]) [id number] Page 1 of 9 Visit Report for 03/15/2023 Arrival Information Details Patient Name: Date of Service: Bradley [name] Bradley [name]. 03/15/2023 1:00 PM Medical Record Number: [id number] Patient Account Number: [id number] Date of Birth/Sex: Treating RN: 12/10/1945 (77 y.o. Bradley [name]) Bradley [name] Primary Care Thad Osoria: Bradley [name] Other Clinician: Betha [name] Referring Zymeir Salminen: Treating Enda Santo/Extender: RO BSO N, Bradley [name] Burnard Bunting in Treatment: 191 Visit Information History Since Last Visit All ordered tests and consults were completed: No Patient Arrived: Wheel Chair Added or deleted any medications: No Arrival Time: 13:06 Any new allergies or adverse reactions: No Transfer Assistance: Michiel Sites Lift Had [name] fall or experienced change in No Patient Identification Verified: Yes activities of daily living that may affect Secondary Verification Process Completed: Yes risk of falls: Patient Requires Transmission-Based Precautions: No Signs or symptoms of abuse/neglect since last visito No Patient Has Alerts: No Hospitalized since last visit: No Implantable device outside of the clinic excluding No cellular tissue based products placed in the center since last visit: Has Dressing in Place as Prescribed: Yes Pain Present Now: No Electronic Signature(s) Signed: 03/15/2023 4:52:56 PM By: Bradley [name] Entered By: Bradley [name] on 03/15/2023 10:06:58 -------------------------------------------------------------------------------- Clinic Level of Care Assessment Details Patient Name: Date of Service: Bradley [name] Bradley [name]. 03/15/2023 1:00 PM Medical Record Number: [id number] Patient Account Number: [id number] Date of Birth/Sex: Treating RN: [birth year] (77 y.o. Bradley [name]) Bradley [name] Primary Care Greysen Swanton: Bradley [name] Other Clinician: Betha [name] Referring Reginal Wojcicki: Treating Pavlos Yon/Extender: RO BSO N, Bradley [name]  Burnard Bunting in Treatment: 191 Clinic Level of Care Assessment Items TOOL 4 Quantity Score []  - 0 Use when only an EandM is performed on FOLLOW-UP visit ASSESSMENTS - Nursing Assessment / Reassessment X- 1 10 Reassessment of Co-morbidities (includes updates in patient status) X- 1 5 Reassessment of Adherence to Treatment Plan Bradley [name], Bradley [name] ([id number]) [id number] Page 2 of 9 ASSESSMENTS - Wound and Skin [name] ssessment / Reassessment []  - 0 Simple Wound Assessment / Reassessment - one wound X- 2 5 Complex Wound Assessment / Reassessment - multiple wounds []  - 0 Dermatologic / Skin Assessment (not related to wound area) ASSESSMENTS - Focused Assessment []  - 0 Circumferential Edema Measurements - multi extremities []  - 0 Nutritional Assessment / Counseling / Intervention []  - 0 Lower Extremity Assessment (monofilament, tuning fork, pulses) []  - 0 Peripheral Arterial Disease Assessment (using hand held doppler) ASSESSMENTS - Ostomy and/or Continence Assessment and Care []  - 0 Incontinence Assessment and Management []  - 0 Ostomy Care Assessment and Management (repouching, etc.) PROCESS - Coordination of Care X - Simple Patient / Family Education for ongoing care 1 15 []  - 0 Complex (extensive) Patient / Family Education for ongoing care []  - 0 Staff obtains Chiropractor, Records, T Results / Process Orders est []  - 0 Staff telephones HHA, Nursing Homes / Clarify orders / etc []  - 0 Routine Transfer to another Facility (non-emergent condition) []  - 0 Routine Hospital Admission (non-emergent condition) []  - 0 New Admissions / Manufacturing engineer / Ordering NPWT Apligraf, etc. , []  - 0 Emergency Hospital Admission (emergent condition) X- 1 10 Simple Discharge Coordination []  - 0 Complex (extensive) Discharge Coordination PROCESS - Special Needs []  - 0 Pediatric / Minor Patient Management []  - 0 Isolation Patient  Management []  - 0 Hearing / Language / Visual special needs []  - 0 Assessment of Community assistance (transportation, D/C planning, etc.) []  - 0 Additional assistance / Altered mentation []  - 0  Support Surface(s) Assessment (bed, cushion, seat, etc.) INTERVENTIONS - Wound Cleansing / Measurement []  - 0 Simple Wound Cleansing - one wound X- 2 5 Complex Wound Cleansing - multiple wounds X- 1 5 Wound Imaging (photographs - any number of wounds) []  - 0 Wound Tracing (instead of photographs) []  - 0 Simple Wound Measurement - one wound X- 2 5 Complex Wound Measurement - multiple wounds INTERVENTIONS - Wound Dressings X - Small Wound Dressing one or multiple wounds 2 10 []  - 0 Medium Wound Dressing one or multiple wounds []  - 0 Large Wound Dressing one or multiple wounds []  - 0 Application of Medications - topical []  - 0 Application of Medications - injection INTERVENTIONS - Miscellaneous []  - 0 External ear exam DAVEY, DEGNAN [name] ([id number]) [id number].pdf Page 3 of 9 []  - 0 Specimen Collection (cultures, biopsies, blood, body fluids, etc.) []  - 0 Specimen(s) / Culture(s) sent or taken to Lab for analysis X- 1 10 Patient Transfer (multiple staff / Michiel Sites Lift / Similar devices) []  - 0 Simple Staple / Suture removal (25 or less) []  - 0 Complex Staple / Suture removal (26 or more) []  - 0 Hypo / Hyperglycemic Management (close monitor of Blood Glucose) []  - 0 Ankle / Brachial Index (ABI) - do not check if billed separately X- 1 5 Vital Signs Has the patient been seen at the hospital within the last three years: Yes Total Score: 110 Level Of Care: New/Established - Level 3 Electronic Signature(s) Signed: 03/15/2023 4:52:56 PM By: Bradley [name] Entered By: Bradley [name] on 03/15/2023 10:50:12 -------------------------------------------------------------------------------- Encounter Discharge Information Details Patient Name: Date of  Service: Bradley [name] Bradley [name]. 03/15/2023 1:00 PM Medical Record Number: [id number] Patient Account Number: [id number] Date of Birth/Sex: Treating RN: 09-Jun-1945 (77 y.o. [name] Primary Care Trystan Eads: Bradley [name] Other Clinician: Betha [name] Referring Koron Godeaux: Treating Dryden Tapley/Extender: RO BSO N, Bradley [name] Burnard Bunting in Treatment: 191 Encounter Discharge Information Items Discharge Condition: Stable Ambulatory Status: Wheelchair Discharge Destination: Home Transportation: Other Accompanied By: self Schedule Follow-up Appointment: Yes Clinical Summary of Care: Electronic Signature(s) Signed: 03/15/2023 4:52:56 PM By: Bradley [name] Entered By: Bradley [name] on 03/15/2023 13:17:10 Lower Extremity Assessment Details -------------------------------------------------------------------------------- Bradley [name] ([id number]) [id number] Page 4 of 9 Patient Name: Date of Service: Bradley [name] Bradley [name]. 03/15/2023 1:00 PM Medical Record Number: [id number] Patient Account Number: [id number] Date of Birth/Sex: Treating RN: March 21, 1946 (77 y.o. Bradley [name]) Bradley [name] Primary Care Riggin Cuttino: Bradley [name] Other Clinician: Betha [name] Referring Emorie Mcfate: Treating [employer]/Extender: RO BSO N, Bradley [name] in Treatment: 191 Electronic Signature(s) Signed: 03/15/2023 4:52:56 PM By: Bradley [name] Signed: 04/03/2023 8:01:26 AM By: Bradley Pax RN Entered By: Bradley [name] on 03/15/2023 10:32:26 -------------------------------------------------------------------------------- Multi Wound Chart Details Patient Name: Date of Service: Bradley [name] Bradley [name]. 03/15/2023 1:00 PM Medical Record Number: [id number] Patient Account Number: [id number] Date of Birth/Sex: Treating RN: [birth year] (77 y.o. Bradley [name]) Bradley [name] Primary Care Cerinity Zynda: Bradley [name] Other Clinician: Betha [name] Referring Jeanclaude Wentworth: Treating  Rayven Rettig/Extender: RO BSO N, Bradley [name] in Treatment: 191 Vital Signs Height(in): Pulse(bpm): 76 Weight(lbs): Blood Pressure(mmHg): 117/72 Body Mass Index(BMI): Temperature(F): 98.0 Respiratory Rate(breaths/min): 18 [10:Photos:] [[name]:[name]] Midline Sacrum Left, Posterior Upper Leg [name] Wound Location: Pressure Injury Shear/Friction [name] Wounding Event: Pressure Ulcer Pressure Ulcer [name] Primary Etiology: Anemia, Hypertension, History of Anemia, Hypertension, History of [name] Comorbid History: pressure wounds, Rheumatoid Arthritis, pressure wounds, Rheumatoid Arthritis, Paraplegia Paraplegia 06/07/2015 11/05/2022 [name] Date Acquired: 191 17  Bradley [name], Bradley [name] ([id number]) [id number] Page 1 of 9 Visit Report for 03/15/2023 Arrival Information Details Patient Name: Date of Service: Bradley [name] Bradley [name]. 03/15/2023 1:00 PM Medical Record Number: [id number] Patient Account Number: [id number] Date of Birth/Sex: Treating RN: 12/10/1945 (77 y.o. Bradley [name]) Bradley [name] Primary Care Thad Osoria: Bradley [name] Other Clinician: Betha [name] Referring Zymeir Salminen: Treating Enda Santo/Extender: RO BSO N, Bradley [name] Burnard Bunting in Treatment: 191 Visit Information History Since Last Visit All ordered tests and consults were completed: No Patient Arrived: Wheel Chair Added or deleted any medications: No Arrival Time: 13:06 Any new allergies or adverse reactions: No Transfer Assistance: Michiel Sites Lift Had [name] fall or experienced change in No Patient Identification Verified: Yes activities of daily living that may affect Secondary Verification Process Completed: Yes risk of falls: Patient Requires Transmission-Based Precautions: No Signs or symptoms of abuse/neglect since last visito No Patient Has Alerts: No Hospitalized since last visit: No Implantable device outside of the clinic excluding No cellular tissue based products placed in the center since last visit: Has Dressing in Place as Prescribed: Yes Pain Present Now: No Electronic Signature(s) Signed: 03/15/2023 4:52:56 PM By: Bradley [name] Entered By: Bradley [name] on 03/15/2023 10:06:58 -------------------------------------------------------------------------------- Clinic Level of Care Assessment Details Patient Name: Date of Service: Bradley [name] Bradley [name]. 03/15/2023 1:00 PM Medical Record Number: [id number] Patient Account Number: [id number] Date of Birth/Sex: Treating RN: [birth year] (77 y.o. Bradley [name]) Bradley [name] Primary Care Greysen Swanton: Bradley [name] Other Clinician: Betha [name] Referring Reginal Wojcicki: Treating Pavlos Yon/Extender: RO BSO N, Bradley [name]  Burnard Bunting in Treatment: 191 Clinic Level of Care Assessment Items TOOL 4 Quantity Score []  - 0 Use when only an EandM is performed on FOLLOW-UP visit ASSESSMENTS - Nursing Assessment / Reassessment X- 1 10 Reassessment of Co-morbidities (includes updates in patient status) X- 1 5 Reassessment of Adherence to Treatment Plan Bradley [name], Bradley [name] ([id number]) [id number] Page 2 of 9 ASSESSMENTS - Wound and Skin [name] ssessment / Reassessment []  - 0 Simple Wound Assessment / Reassessment - one wound X- 2 5 Complex Wound Assessment / Reassessment - multiple wounds []  - 0 Dermatologic / Skin Assessment (not related to wound area) ASSESSMENTS - Focused Assessment []  - 0 Circumferential Edema Measurements - multi extremities []  - 0 Nutritional Assessment / Counseling / Intervention []  - 0 Lower Extremity Assessment (monofilament, tuning fork, pulses) []  - 0 Peripheral Arterial Disease Assessment (using hand held doppler) ASSESSMENTS - Ostomy and/or Continence Assessment and Care []  - 0 Incontinence Assessment and Management []  - 0 Ostomy Care Assessment and Management (repouching, etc.) PROCESS - Coordination of Care X - Simple Patient / Family Education for ongoing care 1 15 []  - 0 Complex (extensive) Patient / Family Education for ongoing care []  - 0 Staff obtains Chiropractor, Records, T Results / Process Orders est []  - 0 Staff telephones HHA, Nursing Homes / Clarify orders / etc []  - 0 Routine Transfer to another Facility (non-emergent condition) []  - 0 Routine Hospital Admission (non-emergent condition) []  - 0 New Admissions / Manufacturing engineer / Ordering NPWT Apligraf, etc. , []  - 0 Emergency Hospital Admission (emergent condition) X- 1 10 Simple Discharge Coordination []  - 0 Complex (extensive) Discharge Coordination PROCESS - Special Needs []  - 0 Pediatric / Minor Patient Management []  - 0 Isolation Patient  Management []  - 0 Hearing / Language / Visual special needs []  - 0 Assessment of Community assistance (transportation, D/C planning, etc.) []  - 0 Additional assistance / Altered mentation []  - 0

## 2023-04-05 ENCOUNTER — Ambulatory Visit: Payer: Medicare HMO | Admitting: Internal Medicine

## 2023-04-07 DIAGNOSIS — G8220 Paraplegia, unspecified: Secondary | ICD-10-CM | POA: Diagnosis not present

## 2023-04-07 DIAGNOSIS — L89154 Pressure ulcer of sacral region, stage 4: Secondary | ICD-10-CM | POA: Diagnosis not present

## 2023-04-10 DIAGNOSIS — L89223 Pressure ulcer of left hip, stage 3: Secondary | ICD-10-CM | POA: Diagnosis not present

## 2023-04-10 DIAGNOSIS — S24103S Unspecified injury at T7-T10 level of thoracic spinal cord, sequela: Secondary | ICD-10-CM | POA: Diagnosis not present

## 2023-04-10 DIAGNOSIS — L89153 Pressure ulcer of sacral region, stage 3: Secondary | ICD-10-CM | POA: Diagnosis not present

## 2023-04-10 DIAGNOSIS — M86152 Other acute osteomyelitis, left femur: Secondary | ICD-10-CM | POA: Diagnosis not present

## 2023-04-10 DIAGNOSIS — M8618 Other acute osteomyelitis, other site: Secondary | ICD-10-CM | POA: Diagnosis not present

## 2023-04-10 DIAGNOSIS — L89893 Pressure ulcer of other site, stage 3: Secondary | ICD-10-CM | POA: Diagnosis not present

## 2023-04-10 DIAGNOSIS — A419 Sepsis, unspecified organism: Secondary | ICD-10-CM | POA: Diagnosis not present

## 2023-04-10 DIAGNOSIS — G8220 Paraplegia, unspecified: Secondary | ICD-10-CM | POA: Diagnosis not present

## 2023-04-10 DIAGNOSIS — I10 Essential (primary) hypertension: Secondary | ICD-10-CM | POA: Diagnosis not present

## 2023-04-18 DIAGNOSIS — Z993 Dependence on wheelchair: Secondary | ICD-10-CM | POA: Diagnosis not present

## 2023-04-18 DIAGNOSIS — L89154 Pressure ulcer of sacral region, stage 4: Secondary | ICD-10-CM | POA: Diagnosis not present

## 2023-04-18 DIAGNOSIS — R252 Cramp and spasm: Secondary | ICD-10-CM | POA: Diagnosis not present

## 2023-04-18 DIAGNOSIS — G8220 Paraplegia, unspecified: Secondary | ICD-10-CM | POA: Diagnosis not present

## 2023-04-19 ENCOUNTER — Encounter: Payer: Medicare HMO | Attending: Physician Assistant | Admitting: Physician Assistant

## 2023-04-19 DIAGNOSIS — D509 Iron deficiency anemia, unspecified: Secondary | ICD-10-CM | POA: Insufficient documentation

## 2023-04-19 DIAGNOSIS — Z933 Colostomy status: Secondary | ICD-10-CM | POA: Diagnosis not present

## 2023-04-19 DIAGNOSIS — M069 Rheumatoid arthritis, unspecified: Secondary | ICD-10-CM | POA: Insufficient documentation

## 2023-04-19 DIAGNOSIS — L89154 Pressure ulcer of sacral region, stage 4: Secondary | ICD-10-CM | POA: Insufficient documentation

## 2023-04-19 DIAGNOSIS — L89894 Pressure ulcer of other site, stage 4: Secondary | ICD-10-CM | POA: Diagnosis not present

## 2023-04-19 DIAGNOSIS — L89224 Pressure ulcer of left hip, stage 4: Secondary | ICD-10-CM | POA: Diagnosis not present

## 2023-04-19 DIAGNOSIS — L89153 Pressure ulcer of sacral region, stage 3: Secondary | ICD-10-CM | POA: Insufficient documentation

## 2023-04-19 DIAGNOSIS — G8220 Paraplegia, unspecified: Secondary | ICD-10-CM | POA: Insufficient documentation

## 2023-04-19 DIAGNOSIS — I10 Essential (primary) hypertension: Secondary | ICD-10-CM | POA: Insufficient documentation

## 2023-04-19 NOTE — Progress Notes (Addendum)
KIERE, EASTERWOOD ([id number]) [id number] Page 1 of 10 Visit Report for 04/19/2023 Arrival Information Details Patient Name: Date of Service: Bradley [name] MES A. 04/19/2023 1:00 PM Medical Record Number: [id number] Patient Account Number: [id number] Date of Birth/Sex: Treating RN: 1945-12-10 (77 y.o. [name]) Yevonne Pax Primary Care Keiffer Piper: Marisue Ivan Other Clinician: Betha Loa Referring Tressa [name]: Treating Rowene Suto/Extender: Rebecka Apley in Treatment: 196 Visit Information History Since Last Visit All ordered tests and consults were completed: No Patient Arrived: Wheel Chair Added or deleted any medications: No Arrival Time: 13:07 Any new allergies or adverse reactions: No Transfer Assistance: Michiel Sites Lift Had a fall or experienced change in No Patient Identification Verified: Yes activities of daily living that may affect Secondary Verification Process Completed: Yes risk of falls: Patient Requires Transmission-Based Precautions: No Signs or symptoms of abuse/neglect since last visito No Patient Has Alerts: No Hospitalized since last visit: No Implantable device outside of the clinic excluding No cellular tissue based products placed in the center since last visit: Has Dressing in Place as Prescribed: Yes Pain Present Now: No Electronic Signature(s) Signed: 04/19/2023 4:52:03 PM By: Betha Loa Entered By: Betha Loa on 04/19/2023 10:10:24 -------------------------------------------------------------------------------- Clinic Level of Care Assessment Details Patient Name: Date of Service: Bradley [name] MES A. 04/19/2023 1:00 PM Medical Record Number: [id number] Patient Account Number: [id number] Date of Birth/Sex: Treating RN: Mar 20, 1946 (77 y.o. [name]) Yevonne Pax Primary Care Cody Oliger: Marisue Ivan Other Clinician: Betha Loa Referring Melynda Krzywicki: Treating Lanny Lipkin/Extender: Rebecka Apley in Treatment: 196 Clinic Level of Care Assessment Items TOOL 4 Quantity Score []  - 0 Use when only an EandM is performed on FOLLOW-UP visit ASSESSMENTS - Nursing Assessment / Reassessment X- 1 10 Reassessment of Co-morbidities (includes updates in patient status) X- 1 5 Reassessment of Adherence to Treatment Plan TREMIR, SIEGMANN A ([id number]) [phone].pdf Page 2 of 10 ASSESSMENTS - Wound and Skin A ssessment / Reassessment []  - 0 Simple Wound Assessment / Reassessment - one wound X- 2 5 Complex Wound Assessment / Reassessment - multiple wounds []  - 0 Dermatologic / Skin Assessment (not related to wound area) ASSESSMENTS - Focused Assessment []  - 0 Circumferential Edema Measurements - multi extremities []  - 0 Nutritional Assessment / Counseling / Intervention []  - 0 Lower Extremity Assessment (monofilament, tuning fork, pulses) []  - 0 Peripheral Arterial Disease Assessment (using hand held doppler) ASSESSMENTS - Ostomy and/or Continence Assessment and Care []  - 0 Incontinence Assessment and Management []  - 0 Ostomy Care Assessment and Management (repouching, etc.) PROCESS - Coordination of Care X - Simple Patient / Family Education for ongoing care 1 15 []  - 0 Complex (extensive) Patient / Family Education for ongoing care []  - 0 Staff obtains Chiropractor, Records, T Results / Process Orders est []  - 0 Staff telephones HHA, Nursing Homes / Clarify orders / etc []  - 0 Routine Transfer to another Facility (non-emergent condition) []  - 0 Routine Hospital Admission (non-emergent condition) []  - 0 New Admissions / Manufacturing engineer / Ordering NPWT Apligraf, etc. , []  - 0 Emergency Hospital Admission (emergent condition) X- 1 10 Simple Discharge Coordination []  - 0 Complex (extensive) Discharge Coordination PROCESS - Special Needs []  - 0 Pediatric / Minor Patient Management []  - 0 Isolation Patient Management []  - 0 Hearing  / Language / Visual special needs []  - 0 Assessment of Community assistance (transportation, D/C planning, etc.) []  - 0 Additional assistance / Altered mentation []  - 0 Support Surface(s) Assessment (bed, cushion, seat, etc.) INTERVENTIONS -  Wound Cleansing / Measurement []  - 0 Simple Wound Cleansing - one wound X- 2 5 Complex Wound Cleansing - multiple wounds X- 1 5 Wound Imaging (photographs - any number of wounds) []  - 0 Wound Tracing (instead of photographs) []  - 0 Simple Wound Measurement - one wound X- 2 5 Complex Wound Measurement - multiple wounds INTERVENTIONS - Wound Dressings []  - 0 Small Wound Dressing one or multiple wounds X- 2 15 Medium Wound Dressing one or multiple wounds []  - 0 Large Wound Dressing one or multiple wounds []  - 0 Application of Medications - topical []  - 0 Application of Medications - injection INTERVENTIONS - Miscellaneous []  - 0 External ear exam ANTONNIO, MCDAVID ([id number]) [phone].pdf Page 3 of 10 []  - 0 Specimen Collection (cultures, biopsies, blood, body fluids, etc.) []  - 0 Specimen(s) / Culture(s) sent or taken to Lab for analysis X- 1 10 Patient Transfer (multiple staff / Michiel Sites Lift / Similar devices) []  - 0 Simple Staple / Suture removal (25 or less) []  - 0 Complex Staple / Suture removal (26 or more) []  - 0 Hypo / Hyperglycemic Management (close monitor of Blood Glucose) []  - 0 Ankle / Brachial Index (ABI) - do not check if billed separately X- 1 5 Vital Signs Has the patient been seen at the hospital within the last three years: Yes Total Score: 120 Level Of Care: New/Established - Level 4 Electronic Signature(s) Signed: 04/19/2023 4:52:03 PM By: Betha Loa Entered By: Betha Loa on 04/19/2023 10:43:17 -------------------------------------------------------------------------------- Encounter Discharge Information Details Patient Name: Date of Service: Bradley [name] MES A. 04/19/2023  1:00 PM Medical Record Number: [id number] Patient Account Number: [id number] Date of Birth/Sex: Treating RN: 23-Aug-1945 (77 y.o. [name] Primary Care Karmyn Lowman: Marisue Ivan Other Clinician: Betha Loa Referring Netty Sullivant: Treating Brytani Voth/Extender: Rebecka Apley in Treatment: 196 Encounter Discharge Information Items Discharge Condition: Stable Ambulatory Status: Wheelchair Discharge Destination: Home Transportation: Other Accompanied By: self Schedule Follow-up Appointment: Yes Clinical Summary of Care: Electronic Signature(s) Signed: 04/19/2023 4:52:03 PM By: Betha Loa Entered By: Betha Loa on 04/19/2023 12:06:49 Lower Extremity Assessment Details -------------------------------------------------------------------------------- [name] ([id number]) [id number] Page 4 of 10 Patient Name: Date of Service: Bradley [name] MES A. 04/19/2023 1:00 PM Medical Record Number: [id number] Patient Account Number: [id number] Date of Birth/Sex: Treating RN: 01-02-1946 (77 y.o. [name]) Yevonne Pax Primary Care Kenzleigh Sedam: Marisue Ivan Other Clinician: Betha Loa Referring Mayetta Castleman: Treating Rowen Hur/Extender: Maxwell Caul Weeks in Treatment: 196 Electronic Signature(s) Signed: 04/19/2023 4:42:48 PM By: Yevonne Pax RN Signed: 04/19/2023 4:52:03 PM By: Betha Loa Entered By: Betha Loa on 04/19/2023 10:32:43 -------------------------------------------------------------------------------- Multi Wound Chart Details Patient Name: Date of Service: Bradley [name] MES A. 04/19/2023 1:00 PM Medical Record Number: [id number] Patient Account Number: [id number] Date of Birth/Sex: Treating RN: 1945/06/17 (77 y.o. [name]) Yevonne Pax Primary Care Abigaelle Verley: Marisue Ivan Other Clinician: Betha Loa Referring Leyli Kevorkian: Treating Kayler Rise/Extender: Rebecka Apley in  Treatment: 196 Vital Signs Height(in): Pulse(bpm): 79 Weight(lbs): Blood Pressure(mmHg): 113/70 Body Mass Index(BMI): Temperature(F): 98.2 Respiratory Rate(breaths/min): 18 [10:Photos:] [N/A:N/A] Midline Sacrum Left, Posterior Upper Leg N/A Wound Location: Pressure Injury Shear/Friction N/A Wounding Event: Pressure Ulcer Pressure Ulcer N/A Primary Etiology: Anemia, Hypertension, History of Anemia, Hypertension, History of N/A Comorbid History: pressure wounds, Rheumatoid Arthritis, pressure wounds, Rheumatoid Arthritis, Paraplegia Paraplegia 06/07/2015 11/05/2022 N/A Date Acquired: 196 22 N/A Weeks of Treatment: Open Open N/A Wound Status: No No N/A Wound Recurrence: No Yes N/A Clustered Wound: N/A 2 N/A  Clustered Quantity: 10x3.8x4.6 5x2.4x4.2 N/A Measurements L x W x D (cm) 29.845 9.425 N/A A (cm) : rea 137.288 39.584 N/A Volume (cm) : 42.10% -344.40% N/A % Reduction in A rea: 55.60% -188395.20% N/A % Reduction in Volume: 11 Position 1 (o'clock): 7.5 Maximum Distance 1 (cm): 9 Starting Position 1 (o'clock): 6 Ending Position 1 (o'clock): 1.4 Maximum Distance 1 (cm): N/A Yes N/A Tunneling: N/A Yes N/A UnderminingCRISTION, RIBELIN ([id number]) [id number] Page 5 of 10 Category/Stage IV Category/Stage IV N/A Classification: Medium Medium N/A Exudate A mount: Serosanguineous Serous N/A Exudate Type: red, brown amber N/A Exudate Color: Epibole Thickened N/A Wound Margin: Large (67-100%) Large (67-100%) N/A Granulation A mount: Red, [name] [name] N/A Granulation Quality: Small (1-33%) None Present (0%) N/A Necrotic A mount: Fat Layer (Subcutaneous Tissue): Yes Fat Layer (Subcutaneous Tissue): Yes N/A Exposed Structures: Muscle: Yes Bone: Yes Fascia: No Fascia: No Tendon: No Tendon: No Joint: No Muscle: No Bone: No Joint: No None None N/A Epithelialization: Treatment Notes Electronic Signature(s) Signed: 04/19/2023  4:52:03 PM By: Betha Loa Entered By: Betha Loa on 04/19/2023 10:32:47 -------------------------------------------------------------------------------- Multi-Disciplinary Care Plan Details Patient Name: Date of Service: Bradley [name] MES A. 04/19/2023 1:00 PM Medical Record Number: [id number] Patient Account Number: [id number] Date of Birth/Sex: Treating RN: [birth year] (77 y.o. [name]) Yevonne Pax Primary Care Tenaya Hilyer: Marisue Ivan Other Clinician: Betha Loa Referring Johnell Bas: Treating Ayari Liwanag/Extender: Maxwell Caul Weeks in Treatment: 196 Active Inactive Electronic Signature(s) Signed: 04/19/2023 4:42:48 PM By: Yevonne Pax RN Signed: 04/19/2023 4:52:03 PM By: Betha Loa Entered By: Betha Loa on 04/19/2023 10:43:27 -------------------------------------------------------------------------------- Pain Assessment Details Patient Name: Date of Service: Bradley [name] MES A. 04/19/2023 1:00 PM Medical Record Number: [id number] Patient Account Number: [id number] Date of Birth/Sex: Treating RN: Jul 25, 1945 (77 y.o. [name] Primary Care Amman Bartel: Marisue Ivan Other Clinician: Betha Loa Referring Willena Jeancharles: Treating Levonte Molina/Extender: Rebecka Apley in Treatment: [address] A ([id number]) [id number] Page 6 of 10 Active Problems Location of Pain Severity and Description of Pain Patient Has Paino No Site Locations Pain Management and Medication Current Pain Management: Electronic Signature(s) Signed: 04/19/2023 4:42:48 PM By: Yevonne Pax RN Signed: 04/19/2023 4:52:03 PM By: Betha Loa Entered By: Betha Loa on 04/19/2023 10:13:05 -------------------------------------------------------------------------------- Patient/Caregiver Education Details Patient Name: Date of Service: Bradley [name] MES A. 11/13/2024andnbsp1:00 PM Medical Record Number: [id number] Patient  Account Number: [id number] Date of Birth/Gender: Treating RN: 12-05-1945 (77 y.o. [name] Primary Care Physician: Marisue Ivan Other Clinician: Betha Loa Referring Physician: Treating Physician/Extender: Rebecka Apley in Treatment: 196 Education Assessment Education Provided To: Patient Education Topics Provided Wound/Skin Impairment: Handouts: Other: continue wound care as directed Methods: Explain/Verbal Responses: State content correctly Electronic Signature(s) Signed: 04/19/2023 4:52:03 PM By: Betha Loa Entered By: Betha Loa on 04/19/2023 12:06:04 [name] ([id number]) [id number] Page 7 of 10 -------------------------------------------------------------------------------- Wound Assessment Details Patient Name: Date of Service: Bradley [name] MES A. 04/19/2023 1:00 PM Medical Record Number: [id number] Patient Account Number: [id number] Date of Birth/Sex: Treating RN: Jun 16, 1945 (77 y.o. [name]) Yevonne Pax Primary Care Jovanka Westgate: Marisue Ivan Other Clinician: Betha Loa Referring Nakisha Chai: Treating Caydan Mctavish/Extender: Maxwell Caul Weeks in Treatment: 196 Wound Status Wound Number: 10 Primary Pressure Ulcer Etiology: Wound Location: Midline Sacrum Wound Open Wounding Event: Pressure Injury Status: Date Acquired: 06/07/2015 Comorbid Anemia, Hypertension, History of pressure wounds, Weeks Of Treatment: 196 History: Rheumatoid Arthritis, Paraplegia Clustered Wound: No Photos Wound Measurements Length: (cm) 10 Width: (cm) 3.8 Depth: (cm)  4.6 Area: (cm) 29.845 Volume: (cm) 137.288 % Reduction in Area: 42.1% % Reduction in Volume: 55.6% Epithelialization: None Wound Description Classification: Category/Stage IV Wound Margin: Epibole Exudate Amount: Medium Exudate Type: Serosanguineous Exudate Color: red, brown Foul Odor After Cleansing: No Slough/Fibrino  Yes Wound Bed Granulation Amount: Large (67-100%) Exposed Structure Granulation Quality: Red, [name] Fascia Exposed: No Necrotic Amount: Small (1-33%) Fat Layer (Subcutaneous Tissue) Exposed: Yes Necrotic Quality: Adherent Slough Tendon Exposed: No Muscle Exposed: Yes Necrosis of Muscle: No Joint Exposed: No Bone Exposed: No Treatment Notes Wound #10 (Sacrum) Wound Laterality: Midline Cleanser OMORO, TOTZKE A ([id number]) [id number] Page 8 of 10 Dakin 16 (oz) 0.25 Discharge Instruction: Use as directed. Peri-Wound Care Topical Primary Dressing Gauze Discharge Instruction: Moisten 2-3 gauze with Dakins and gently pack into wound Secondary Dressing Zetuvit Plus 4x8 (in/in) Secured With Medipore T - 38M Medipore H Soft Cloth Surgical T ape ape, 2x2 (in/yd) Compression Wrap Compression Stockings Add-Ons Electronic Signature(s) Signed: 04/19/2023 4:42:48 PM By: Yevonne Pax RN Signed: 04/19/2023 4:52:03 PM By: Betha Loa Entered By: Betha Loa on 04/19/2023 10:31:11 -------------------------------------------------------------------------------- Wound Assessment Details Patient Name: Date of Service: Bradley [name] MES A. 04/19/2023 1:00 PM Medical Record Number: [id number] Patient Account Number: [id number] Date of Birth/Sex: Treating RN: 07-29-1945 (77 y.o. [name]) Yevonne Pax Primary Care Camillo Quadros: Marisue Ivan Other Clinician: Betha Loa Referring Twanisha Foulk: Treating Vasili Fok/Extender: Maxwell Caul Weeks in Treatment: 196 Wound Status Wound Number: 14 Primary Pressure Ulcer Etiology: Wound Location: Left, Posterior Upper Leg Wound Open Wounding Event: Shear/Friction Status: Date Acquired: 11/05/2022 Comorbid Anemia, Hypertension, History of pressure wounds, Weeks Of Treatment: 22 History: Rheumatoid Arthritis, Paraplegia Clustered Wound: Yes Photos Wound Measurements Length: (cm) 5 Width: (cm) 2.4 Depth:  (cm) 4.2 MARQUON, CIPRES A ([id number]) Clustered Quantity: 2 Area: (cm) 9.425 Volume: (cm) 39.584 % Reduction in Area: -344.4% % Reduction in Volume: -188395.2% Epithelialization: None [id number] Page 9 of 10 Tunneling: Yes Position (o'clock): 11 Maximum Distance: (cm) 7.5 Undermining: Yes Starting Position (o'clock): 9 Ending Position (o'clock): 6 Maximum Distance: (cm) 1.4 Wound Description Classification: Category/Stage IV Wound Margin: Thickened Exudate Amount: Medium Exudate Type: Serous Exudate Color: amber Foul Odor After Cleansing: No Slough/Fibrino No Wound Bed Granulation Amount: Large (67-100%) Exposed Structure Granulation Quality: [name] Fascia Exposed: No Necrotic Amount: None Present (0%) Fat Layer (Subcutaneous Tissue) Exposed: Yes Tendon Exposed: No Muscle Exposed: No Joint Exposed: No Bone Exposed: Yes Treatment Notes Wound #14 (Upper Leg) Wound Laterality: Left, Posterior Cleanser Dakin 16 (oz) 0.25 Discharge Instruction: Use as directed. Peri-Wound Care Topical Primary Dressing Gauze Discharge Instruction: Moisten 2-3 gauze with Dakins and gently pack into wound and tunnel Secondary Dressing Zetuvit Plus 4x4 (in/in) Secured With Medipore T - 38M Medipore H Soft Cloth Surgical T ape ape, 2x2 (in/yd) Compression Wrap Compression Stockings Add-Ons Electronic Signature(s) Signed: 04/19/2023 4:42:48 PM By: Yevonne Pax RN Signed: 04/19/2023 4:52:03 PM By: Betha Loa Entered By: Betha Loa on 04/19/2023 10:32:34 -------------------------------------------------------------------------------- Vitals Details Patient Name: Date of Service: Bradley [name] MES A. 04/19/2023 1:00 PM Medical Record Number: [id number] Patient Account Number: [id number] DEWELL, CAGLE A ([id number]) [id number] Page 10 of 10 Date of Birth/Sex: Treating RN: [birth year] (77 y.o. [name]) Yevonne Pax Primary Care Karn Derk:  Other Clinician: Doylene Canard Referring Traveon Louro: Treating Ason Heslin/Extender: Rebecka Apley in Treatment: 196 Vital Signs Time Taken: 13:11 Temperature (F): 98.2 Pulse (bpm): 79 Respiratory Rate (breaths/min): 18 Blood Pressure (mmHg): 113/70 Reference Range: 80 - 120 mg / dl Electronic Signature(s) Signed: 04/19/2023  4:52:03 PM By: Betha Loa Entered By: Betha Loa on 04/19/2023 10:13:00

## 2023-04-19 NOTE — Progress Notes (Signed)
ALEGANDRO, Bradley [name] ([id number]) [id number] Page 1 of 4 Visit Report for 04/19/2023 Chief Complaint Document Details Patient Name: Date of Service: Bradley [name] [name] 04/19/2023 1:00 PM Medical Record Number: [id number] Patient Account Number: [id number] Date of Birth/Sex: Treating RN: Jul 30, 1945 (77 y.o. Bradley [name]) Yevonne Pax Primary Care Provider: Marisue Ivan Other Clinician: Betha Loa Referring Provider: Treating Provider/Extender: Rebecka Apley in Treatment: 196 Information Obtained from: Patient Chief Complaint sacral ulcer, left posterior leg/thigh wound Electronic Signature(s) Signed: 04/19/2023 1:02:05 PM By: Allen Derry PA-C Entered By: Allen Derry on 04/19/2023 10:02:05 -------------------------------------------------------------------------------- Physician Orders Details Patient Name: Date of Service: Bradley [name] [name] 04/19/2023 1:00 PM Medical Record Number: [id number] Patient Account Number: [id number] Date of Birth/Sex: Treating RN: 03-11-1946 (77 y.o. Bradley [name] Primary Care Provider: Marisue Ivan Other Clinician: Betha Loa Referring Provider: Treating Provider/Extender: Rebecka Apley in Treatment: 196 The following information was scribed by: Betha Loa The information was scribed for: Allen Derry Verbal / Phone Orders: No Diagnosis Coding ICD-10 Coding Code Description L89.154 Pressure ulcer of sacral region, stage 4 L89.894 Pressure ulcer of other site, stage 4 M86.68 Other chronic osteomyelitis, other site G82.20 Paraplegia, unspecified Follow-up Appointments Return Appointment in 2 weeks. Bradley, [name] ([id number]) [id number] Page 2 of 4 Home Health Home Health Company: - [facility] for wound care. May utilize formulary equivalent dressing for wound treatment orders unless otherwise specified. Home Health  Nurse may visit PRN to address patients wound care needs. Frances Furbish [phone] **Please direct any NON-WOUND related issues/requests for orders to patient's Primary Care Physician. **If current dressing causes regression in wound condition, may D/C ordered dressing product/s and apply Normal Saline Moist Dressing daily until next Wound Healing Center or Other MD appointment. **Notify Wound Healing Center of regression in wound condition at [phone]. Other Home Health Orders/Instructions: Bathing/ Shower/ Hygiene May shower; gently cleanse wound with antibacterial soap, rinse and pat dry prior to dressing wounds Off-Loading Roho cushion for wheelchair Low air-loss mattress (Group 2) Turn and reposition every 2 hours Wound Treatment Wound #10 - Sacrum Wound Laterality: Midline Cleanser: Dakin 16 (oz) 0.25 1 x Per Day/30 Days Discharge Instructions: Use as directed. Prim Dressing: Gauze (Generic) 1 x Per Day/30 Days ary Discharge Instructions: Moisten 2-3 gauze with Dakins and gently pack into wound Secondary Dressing: Zetuvit Plus 4x8 (in/in) 1 x Per Day/30 Days Secured With: Medipore T - 37M Medipore H Soft Cloth Surgical T ape ape, 2x2 (in/yd) (Generic) 1 x Per Day/30 Days Wound #14 - Upper Leg Wound Laterality: Left, Posterior Cleanser: Dakin 16 (oz) 0.25 1 x Per Day/30 Days Discharge Instructions: Use as directed. Prim Dressing: Gauze (Generic) 1 x Per Day/30 Days ary Discharge Instructions: Moisten 2-3 gauze with Dakins and gently pack into wound and tunnel Secondary Dressing: Zetuvit Plus 4x4 (in/in) 1 x Per Day/30 Days Secured With: Medipore T - 37M Medipore H Soft Cloth Surgical T ape ape, 2x2 (in/yd) (Generic) 1 x Per Day/30 Days Electronic Signature(s) Unsigned Entered ByBetha Loa on 04/19/2023 10:42:42 -------------------------------------------------------------------------------- Problem List Details Patient Name: Date of Service: Bradley [name] [name] 04/19/2023  1:00 PM Medical Record Number: [id number] Patient Account Number: [id number] Date of Birth/Sex: Treating RN: 05-24-1946 (77 y.o. Bradley [name]) Yevonne Pax Primary Care Provider: Marisue Ivan Other Clinician: Betha Loa Referring Provider: Treating Provider/Extender: Rebecka Apley in Treatment: 196 Active Problems ICD-10 Encounter Code Description Active Date MDM Diagnosis [name], [name] ([id number]) [phone].pdf Page  3 of 4 L89.154 Pressure ulcer of sacral region, stage 4 07/16/2019 No Yes L89.894 Pressure ulcer of other site, stage 4 11/16/2022 No Yes M86.68 Other chronic osteomyelitis, other site 07/16/2019 No Yes G82.20 Paraplegia, unspecified 07/16/2019 No Yes Inactive Problems ICD-10 Code Description Active Date Inactive Date 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 04/19/2023 Medical Record Number: [id number] Patient Account Number: [id number] Date of Birth/Sex: Treating RN: May 03, 1946 (77 y.o. Bradley [name] Primary Care Provider: Marisue Ivan Other Clinician: Betha Loa Referring Provider: Treating Provider/Extender: Maxwell Caul Weeks in Treatment: 196 Diagnosis Coding ICD-10 Codes Code Description L89.154 Pressure ulcer of sacral region, stage 4 L89.894 Pressure ulcer of other site, stage 4 M86.68 Other chronic osteomyelitis, other site G82.20 Paraplegia, unspecified Facility Procedures : Bradley, [name] Code: [id number] Bradley [name] ([id number]) Description: 99214 - WOUND CARE VISIT-LEV 4 EST PT [id number] Pag Modifier: e 4 of 4 Quantity: 1 Electronic Signature(s) Unsigned Entered By: Betha Loa on 04/19/2023 10:43:22 Signature(s): Date(s):

## 2023-04-21 DIAGNOSIS — L89153 Pressure ulcer of sacral region, stage 3: Secondary | ICD-10-CM | POA: Diagnosis not present

## 2023-04-21 DIAGNOSIS — I700 Atherosclerosis of aorta: Secondary | ICD-10-CM | POA: Diagnosis not present

## 2023-04-21 DIAGNOSIS — I10 Essential (primary) hypertension: Secondary | ICD-10-CM | POA: Diagnosis not present

## 2023-04-21 DIAGNOSIS — N319 Neuromuscular dysfunction of bladder, unspecified: Secondary | ICD-10-CM | POA: Diagnosis not present

## 2023-04-21 DIAGNOSIS — D509 Iron deficiency anemia, unspecified: Secondary | ICD-10-CM | POA: Diagnosis not present

## 2023-04-21 DIAGNOSIS — I252 Old myocardial infarction: Secondary | ICD-10-CM | POA: Diagnosis not present

## 2023-04-21 DIAGNOSIS — S24103S Unspecified injury at T7-T10 level of thoracic spinal cord, sequela: Secondary | ICD-10-CM | POA: Diagnosis not present

## 2023-04-21 DIAGNOSIS — G8220 Paraplegia, unspecified: Secondary | ICD-10-CM | POA: Diagnosis not present

## 2023-04-21 DIAGNOSIS — L89223 Pressure ulcer of left hip, stage 3: Secondary | ICD-10-CM | POA: Diagnosis not present

## 2023-04-27 DIAGNOSIS — N319 Neuromuscular dysfunction of bladder, unspecified: Secondary | ICD-10-CM | POA: Diagnosis not present

## 2023-04-27 DIAGNOSIS — L89153 Pressure ulcer of sacral region, stage 3: Secondary | ICD-10-CM | POA: Diagnosis not present

## 2023-04-27 DIAGNOSIS — S24103S Unspecified injury at T7-T10 level of thoracic spinal cord, sequela: Secondary | ICD-10-CM | POA: Diagnosis not present

## 2023-04-27 DIAGNOSIS — D509 Iron deficiency anemia, unspecified: Secondary | ICD-10-CM | POA: Diagnosis not present

## 2023-04-27 DIAGNOSIS — I10 Essential (primary) hypertension: Secondary | ICD-10-CM | POA: Diagnosis not present

## 2023-04-27 DIAGNOSIS — G8220 Paraplegia, unspecified: Secondary | ICD-10-CM | POA: Diagnosis not present

## 2023-04-27 DIAGNOSIS — L89223 Pressure ulcer of left hip, stage 3: Secondary | ICD-10-CM | POA: Diagnosis not present

## 2023-04-28 DIAGNOSIS — D509 Iron deficiency anemia, unspecified: Secondary | ICD-10-CM | POA: Diagnosis not present

## 2023-04-28 DIAGNOSIS — I10 Essential (primary) hypertension: Secondary | ICD-10-CM | POA: Diagnosis not present

## 2023-04-28 DIAGNOSIS — S24103S Unspecified injury at T7-T10 level of thoracic spinal cord, sequela: Secondary | ICD-10-CM | POA: Diagnosis not present

## 2023-04-28 DIAGNOSIS — N319 Neuromuscular dysfunction of bladder, unspecified: Secondary | ICD-10-CM | POA: Diagnosis not present

## 2023-04-28 DIAGNOSIS — L89153 Pressure ulcer of sacral region, stage 3: Secondary | ICD-10-CM | POA: Diagnosis not present

## 2023-04-28 DIAGNOSIS — I700 Atherosclerosis of aorta: Secondary | ICD-10-CM | POA: Diagnosis not present

## 2023-04-28 DIAGNOSIS — L89223 Pressure ulcer of left hip, stage 3: Secondary | ICD-10-CM | POA: Diagnosis not present

## 2023-04-28 DIAGNOSIS — G8220 Paraplegia, unspecified: Secondary | ICD-10-CM | POA: Diagnosis not present

## 2023-04-28 DIAGNOSIS — I252 Old myocardial infarction: Secondary | ICD-10-CM | POA: Diagnosis not present

## 2023-05-01 DIAGNOSIS — E876 Hypokalemia: Secondary | ICD-10-CM | POA: Diagnosis not present

## 2023-05-03 ENCOUNTER — Other Ambulatory Visit: Payer: Self-pay

## 2023-05-03 ENCOUNTER — Emergency Department: Payer: Medicare HMO

## 2023-05-03 ENCOUNTER — Inpatient Hospital Stay
Admission: EM | Admit: 2023-05-03 | Discharge: 2023-05-08 | DRG: 871 | Disposition: A | Discharge status: Home Health | Payer: Medicare HMO | Attending: Internal Medicine | Admitting: Internal Medicine

## 2023-05-03 ENCOUNTER — Encounter: Payer: Medicare HMO | Admitting: Physician Assistant

## 2023-05-03 DIAGNOSIS — R000 Tachycardia, unspecified: Secondary | ICD-10-CM | POA: Diagnosis not present

## 2023-05-03 DIAGNOSIS — L89154 Pressure ulcer of sacral region, stage 4: Secondary | ICD-10-CM

## 2023-05-03 DIAGNOSIS — E876 Hypokalemia: Secondary | ICD-10-CM | POA: Diagnosis present

## 2023-05-03 DIAGNOSIS — E871 Hypo-osmolality and hyponatremia: Secondary | ICD-10-CM | POA: Diagnosis present

## 2023-05-03 DIAGNOSIS — S31000A Unspecified open wound of lower back and pelvis without penetration into retroperitoneum, initial encounter: Secondary | ICD-10-CM | POA: Diagnosis present

## 2023-05-03 DIAGNOSIS — N3000 Acute cystitis without hematuria: Secondary | ICD-10-CM

## 2023-05-03 DIAGNOSIS — N319 Neuromuscular dysfunction of bladder, unspecified: Secondary | ICD-10-CM | POA: Diagnosis present

## 2023-05-03 DIAGNOSIS — I10 Essential (primary) hypertension: Secondary | ICD-10-CM | POA: Diagnosis present

## 2023-05-03 DIAGNOSIS — Z79899 Other long term (current) drug therapy: Secondary | ICD-10-CM | POA: Diagnosis not present

## 2023-05-03 DIAGNOSIS — G8250 Quadriplegia, unspecified: Secondary | ICD-10-CM | POA: Diagnosis not present

## 2023-05-03 DIAGNOSIS — E872 Acidosis, unspecified: Secondary | ICD-10-CM | POA: Diagnosis present

## 2023-05-03 DIAGNOSIS — Z87891 Personal history of nicotine dependence: Secondary | ICD-10-CM | POA: Diagnosis not present

## 2023-05-03 DIAGNOSIS — A419 Sepsis, unspecified organism: Principal | ICD-10-CM | POA: Diagnosis present

## 2023-05-03 DIAGNOSIS — Z87440 Personal history of urinary (tract) infections: Secondary | ICD-10-CM

## 2023-05-03 DIAGNOSIS — M4628 Osteomyelitis of vertebra, sacral and sacrococcygeal region: Secondary | ICD-10-CM | POA: Diagnosis not present

## 2023-05-03 DIAGNOSIS — Z87828 Personal history of other (healed) physical injury and trauma: Secondary | ICD-10-CM

## 2023-05-03 DIAGNOSIS — L089 Local infection of the skin and subcutaneous tissue, unspecified: Secondary | ICD-10-CM

## 2023-05-03 DIAGNOSIS — Z888 Allergy status to other drugs, medicaments and biological substances status: Secondary | ICD-10-CM

## 2023-05-03 DIAGNOSIS — N390 Urinary tract infection, site not specified: Secondary | ICD-10-CM | POA: Diagnosis present

## 2023-05-03 DIAGNOSIS — G8220 Paraplegia, unspecified: Secondary | ICD-10-CM | POA: Diagnosis present

## 2023-05-03 DIAGNOSIS — D509 Iron deficiency anemia, unspecified: Secondary | ICD-10-CM | POA: Diagnosis not present

## 2023-05-03 DIAGNOSIS — L89894 Pressure ulcer of other site, stage 4: Secondary | ICD-10-CM | POA: Diagnosis not present

## 2023-05-03 DIAGNOSIS — R6520 Severe sepsis without septic shock: Principal | ICD-10-CM | POA: Diagnosis present

## 2023-05-03 DIAGNOSIS — I700 Atherosclerosis of aorta: Secondary | ICD-10-CM | POA: Diagnosis not present

## 2023-05-03 DIAGNOSIS — M8660 Other chronic osteomyelitis, unspecified site: Secondary | ICD-10-CM

## 2023-05-03 DIAGNOSIS — M869 Osteomyelitis, unspecified: Secondary | ICD-10-CM | POA: Diagnosis present

## 2023-05-03 DIAGNOSIS — R6883 Chills (without fever): Secondary | ICD-10-CM | POA: Diagnosis not present

## 2023-05-03 LAB — CBC WITH DIFFERENTIAL/PLATELET
Abs Immature Granulocytes: 0.07 10*3/uL (ref 0.00–0.07)
Basophils Absolute: 0 10*3/uL (ref 0.0–0.1)
Basophils Relative: 0 %
Eosinophils Absolute: 0.1 10*3/uL (ref 0.0–0.5)
Eosinophils Relative: 0 %
HCT: 33 % — ABNORMAL LOW (ref 39.0–52.0)
Hemoglobin: 10 g/dL — ABNORMAL LOW (ref 13.0–17.0)
Immature Granulocytes: 0 %
Lymphocytes Relative: 12 %
Lymphs Abs: 1.9 10*3/uL (ref 0.7–4.0)
MCH: 22.5 pg — ABNORMAL LOW (ref 26.0–34.0)
MCHC: 30.3 g/dL (ref 30.0–36.0)
MCV: 74.2 fL — ABNORMAL LOW (ref 80.0–100.0)
Monocytes Absolute: 1.7 10*3/uL — ABNORMAL HIGH (ref 0.1–1.0)
Monocytes Relative: 11 %
Neutro Abs: 12.6 10*3/uL — ABNORMAL HIGH (ref 1.7–7.7)
Neutrophils Relative %: 77 %
Platelets: 456 10*3/uL — ABNORMAL HIGH (ref 150–400)
RBC: 4.45 MIL/uL (ref 4.22–5.81)
RDW: 17.1 % — ABNORMAL HIGH (ref 11.5–15.5)
WBC: 16.3 10*3/uL — ABNORMAL HIGH (ref 4.0–10.5)
nRBC: 0 % (ref 0.0–0.2)

## 2023-05-03 LAB — LACTIC ACID, PLASMA: Lactic Acid, Venous: 2 mmol/L (ref 0.5–1.9)

## 2023-05-03 LAB — COMPREHENSIVE METABOLIC PANEL
ALT: 11 U/L (ref 0–44)
AST: 23 U/L (ref 15–41)
Albumin: 2.5 g/dL — ABNORMAL LOW (ref 3.5–5.0)
Alkaline Phosphatase: 73 U/L (ref 38–126)
Anion gap: 10 (ref 5–15)
BUN: 14 mg/dL (ref 8–23)
CO2: 26 mmol/L (ref 22–32)
Calcium: 8.3 mg/dL — ABNORMAL LOW (ref 8.9–10.3)
Chloride: 95 mmol/L — ABNORMAL LOW (ref 98–111)
Creatinine, Ser: 0.68 mg/dL (ref 0.61–1.24)
GFR, Estimated: >60 mL/min (ref >60)
Glucose, Bld: 123 mg/dL — ABNORMAL HIGH (ref 70–99)
Potassium: 3 mmol/L — ABNORMAL LOW (ref 3.5–5.1)
Sodium: 131 mmol/L — ABNORMAL LOW (ref 135–145)
Total Bilirubin: 0.6 mg/dL (ref <1.2)
Total Protein: 8 g/dL (ref 6.5–8.1)

## 2023-05-03 NOTE — ED Triage Notes (Signed)
Pt arrives via POV after being seen at the [facility], he has two pressure ulcers (one on left leg and one on sacrum). Pt reports they have been present for years. Pt repots he developed chills and fatigue on Monday. States there has been increased drainage from both wounds recently. Pt AxOx4.

## 2023-05-03 NOTE — Progress Notes (Addendum)
MARVELLE, COLLINSWORTH ([id number]) [id number] Page 1 of 10 Visit Report for 05/03/2023 Arrival Information Details Patient Name: Date of Service: Bradley [name]. 05/03/2023 1:00 PM Medical Record Number: [id number] Patient Account Number: [id number] Date of Birth/Sex: Treating RN: 21-Oct-1945 (77 y.o. Bradley [name]) Yevonne Pax Primary Care Meyah Corle: Bradley [name] Other Clinician: Betha [name] Referring Danelly Hassinger: Treating Bradley [name]/Extender: Rebecka Apley in Treatment: 198 Visit Information History Since Last Visit All ordered tests and consults were completed: No Patient Arrived: Wheel Chair Added or deleted any medications: No Arrival Time: 13:09 Any new allergies or adverse reactions: No Transfer Assistance: Michiel Sites Lift Had [name] fall or experienced change in No Patient Identification Verified: Yes activities of daily living that may affect Secondary Verification Process Completed: Yes risk of falls: Patient Requires Transmission-Based Precautions: No Signs or symptoms of abuse/neglect since last visito No Patient Has Alerts: No Hospitalized since last visit: No Implantable device outside of the clinic excluding No cellular tissue based products placed in the center since last visit: Pain Present Now: No Electronic Signature(s) Signed: 05/03/2023 5:04:47 PM By: Bradley [name] Entered By: Bradley [name] on 05/03/2023 13:09:47 -------------------------------------------------------------------------------- Clinic Level of Care Assessment Details Patient Name: Date of Service: Bradley [name]. 05/03/2023 1:00 PM Medical Record Number: [id number] Patient Account Number: [id number] Date of Birth/Sex: Treating RN: [birth year] (77 y.o. Bradley [name]) Yevonne Pax Primary Care Bradley [name]: Bradley [name] Other Clinician: Betha [name] Referring Bradley [name]: Treating Bradley [name]/Extender: Rebecka Apley in Treatment: 198 Clinic Level  of Care Assessment Items TOOL 4 Quantity Score []  - 0 Use when only an EandM is performed on FOLLOW-UP visit ASSESSMENTS - Nursing Assessment / Reassessment X- 1 10 Reassessment of Co-morbidities (includes updates in patient status) X- 1 5 Reassessment of Adherence to Treatment Plan Bradley [name], Bradley [name] ([id number]) [id number] Page 2 of 10 ASSESSMENTS - Wound and Skin [name] ssessment / Reassessment []  - Simple Wound Assessment / Reassessment - one wound 0 X- 2 5 Complex Wound Assessment / Reassessment - multiple wounds []  - 0 Dermatologic / Skin Assessment (not related to wound area) ASSESSMENTS - Focused Assessment []  - 0 Circumferential Edema Measurements - multi extremities []  - 0 Nutritional Assessment / Counseling / Intervention []  - 0 Lower Extremity Assessment (monofilament, tuning fork, pulses) []  - 0 Peripheral Arterial Disease Assessment (using hand held doppler) ASSESSMENTS - Ostomy and/or Continence Assessment and Care []  - 0 Incontinence Assessment and Management []  - 0 Ostomy Care Assessment and Management (repouching, etc.) PROCESS - Coordination of Care X - Simple Patient / Family Education for ongoing care 1 15 []  - 0 Complex (extensive) Patient / Family Education for ongoing care []  - 0 Staff obtains Chiropractor, Records, T Results / Process Orders est []  - 0 Staff telephones HHA, Nursing Homes / Clarify orders / etc []  - 0 Routine Transfer to another Facility (non-emergent condition) []  - 0 Routine Hospital Admission (non-emergent condition) []  - 0 New Admissions / Manufacturing engineer / Ordering NPWT Apligraf, etc. , []  - 0 Emergency Hospital Admission (emergent condition) X- 1 10 Simple Discharge Coordination []  - 0 Complex (extensive) Discharge Coordination PROCESS - Special Needs []  - 0 Pediatric / Minor Patient Management []  - 0 Isolation Patient Management []  - 0 Hearing / Language / Visual special needs []  -  0 Assessment of Community assistance (transportation, D/C planning, etc.) []  - 0 Additional assistance / Altered mentation []  - 0 Support Surface(s) Assessment (bed, cushion, seat, etc.) INTERVENTIONS - Wound Cleansing / Measurement []  -  0 Simple Wound Cleansing - one wound X- 2 5 Complex Wound Cleansing - multiple wounds X- 1 5 Wound Imaging (photographs - any number of wounds) []  - 0 Wound Tracing (instead of photographs) []  - 0 Simple Wound Measurement - one wound X- 2 5 Complex Wound Measurement - multiple wounds INTERVENTIONS - Wound Dressings []  - 0 Small Wound Dressing one or multiple wounds X- 2 15 Medium Wound Dressing one or multiple wounds []  - 0 Large Wound Dressing one or multiple wounds []  - 0 Application of Medications - topical []  - 0 Application of Medications - injection INTERVENTIONS - Miscellaneous []  - 0 External ear exam Bradley [name], Bradley [name] ([id number]) [id number] Page 3 of 10 []  - 0 Specimen Collection (cultures, biopsies, blood, body fluids, etc.) []  - 0 Specimen(s) / Culture(s) sent or taken to Lab for analysis X- 1 10 Patient Transfer (multiple staff / Michiel Sites Lift / Similar devices) []  - 0 Simple Staple / Suture removal (25 or less) []  - 0 Complex Staple / Suture removal (26 or more) []  - 0 Hypo / Hyperglycemic Management (close monitor of Blood Glucose) []  - 0 Ankle / Brachial Index (ABI) - do not check if billed separately X- 1 5 Vital Signs Has the patient been seen at the hospital within the last three years: Yes Total Score: 120 Level Of Care: New/Established - Level 4 Electronic Signature(s) Signed: 05/03/2023 5:04:47 PM By: Bradley [name] Entered By: Bradley [name] on 05/03/2023 14:27:00 -------------------------------------------------------------------------------- Encounter Discharge Information Details Patient Name: Date of Service: Bradley [name]. 05/03/2023 1:00 PM Medical Record Number:  [id number] Patient Account Number: [id number] Date of Birth/Sex: Treating RN: 04/20/1946 (77 y.o. Bradley [name] Primary Care Sally Reimers: Bradley [name] Other Clinician: Betha [name] Referring Kimla Furth: Treating Delyle Weider/Extender: Rebecka Apley in Treatment: 198 Encounter Discharge Information Items Discharge Condition: Stable Ambulatory Status: Wheelchair Discharge Destination: Emergency Room Telephoned: No Orders Sent: Yes Transportation: Other Accompanied By: self Schedule Follow-up Appointment: Yes Clinical Summary of Care: Electronic Signature(s) Signed: 05/03/2023 5:04:47 PM By: Bradley [name] Entered By: Bradley [name] on 05/03/2023 17:03:37 Bradley [name] ([id number]) [id number] Page 4 of 10 -------------------------------------------------------------------------------- Lower Extremity Assessment Details Patient Name: Date of Service: Bradley [name]. 05/03/2023 1:00 PM Medical Record Number: [id number] Patient Account Number: [id number] Date of Birth/Sex: Treating RN: [birth year] (77 y.o. Bradley [name]) Yevonne Pax Primary Care Delila Kuklinski: Bradley [name] Other Clinician: Betha [name] Referring Janicia Monterrosa: Treating Kewan Mcnease/Extender: Maxwell Caul Weeks in Treatment: 198 Electronic Signature(s) Signed: 05/03/2023 2:52:21 PM By: Yevonne Pax RN Signed: 05/03/2023 5:04:47 PM By: Bradley [name] Entered By: Bradley [name] on 05/03/2023 13:29:26 -------------------------------------------------------------------------------- Multi Wound Chart Details Patient Name: Date of Service: Bradley [name]. 05/03/2023 1:00 PM Medical Record Number: [id number] Patient Account Number: [id number] Date of Birth/Sex: Treating RN: 10-14-1945 (77 y.o. Bradley [name]) Yevonne Pax Primary Care Audreena Sachdeva: Bradley [name] Other Clinician: Betha [name] Referring Livia Tarr: Treating Esteven Overfelt/Extender: Rebecka Apley in Treatment: 198 Vital Signs Height(in): Pulse(bpm): 102 Weight(lbs): Blood Pressure(mmHg): 102/64 Body Mass Index(BMI): Temperature(F): 98.7 Respiratory Rate(breaths/min): 18 [10:Photos:] [[name]:[name]] Midline Sacrum Left, Posterior Upper Leg [name] Wound Location: Pressure Injury Shear/Friction [name] Wounding Event: Pressure Ulcer Pressure Ulcer [name] Primary Etiology: Anemia, Hypertension, History of Anemia, Hypertension, History of [name] Comorbid History: pressure wounds, Rheumatoid Arthritis, pressure wounds, Rheumatoid Arthritis, Paraplegia Paraplegia 06/07/2015 11/05/2022 [name] Date Acquired: 198 24 [name] Weeks of Treatment: Open Open [name] Wound Status: No No [name] Wound Recurrence: No Yes [name] Clustered Wound: [name] 2 [name]  Clustered Quantity: 10x4x3.4 5x2x3.7 [name] Measurements L x W x D (cm) 31.416 7.854 [name] [name] (cm) : rea 106.814 29.06 [name] Volume (cm) : 39.00% -270.30% [name] % Reduction in [name] rea: 65.40% -138281.00% [name] % Reduction in Volume: 11 Position 1 (o'clock): 8.2 Maximum Distance 1 (cm): 12 Starting Position 1 (o'clock): 12 Ending Position 1 (o'clock): Bradley [name], Bradley [name] ([id number]) [id number] Page 5 of 10 1.3 Maximum Distance 1 (cm): [name] Yes [name] Tunneling: [name] Yes [name] Undermining: Category/Stage IV Category/Stage IV [name] Classification: Medium Medium [name] Exudate [name] mount: Serosanguineous Serous [name] Exudate Type: red, brown amber [name] Exudate Color: Epibole Thickened [name] Wound Margin: Large (67-100%) Large (67-100%) [name] Granulation [name] mount: Red, Pink Pink [name] Granulation Quality: Small (1-33%) None Present (0%) [name] Necrotic [name] mount: Fat Layer (Subcutaneous Tissue): Yes Fat Layer (Subcutaneous Tissue): Yes [name] Exposed Structures: Muscle: Yes Bone: Yes Fascia: No Fascia: No Tendon: No Tendon: No Joint: No Muscle: No Bone: No Joint: No None None [name] Epithelialization: Treatment Notes Electronic  Signature(s) Signed: 05/03/2023 5:04:47 PM By: Bradley [name] Entered By: Bradley [name] on 05/03/2023 13:29:32 -------------------------------------------------------------------------------- Multi-Disciplinary Care Plan Details Patient Name: Date of Service: Bradley [name]. 05/03/2023 1:00 PM Medical Record Number: [id number] Patient Account Number: [id number] Date of Birth/Sex: Treating RN: 12/13/1945 (77 y.o. Bradley [name] Primary Care Ernestine Langworthy: Bradley [name] Other Clinician: Betha [name] Referring Dayna Alia: Treating Tarrance Januszewski/Extender: Maxwell Caul Weeks in Treatment: 198 Active Inactive Electronic Signature(s) Signed: 05/03/2023 5:04:47 PM By: Bradley [name] Signed: 05/08/2023 3:33:10 PM By: Yevonne Pax RN Entered By: Bradley [name] on 05/03/2023 17:01:38 -------------------------------------------------------------------------------- Pain Assessment Details Patient Name: Date of Service: Bradley [name]. 05/03/2023 1:00 PM Medical Record Number: [id number] Patient Account Number: [id number] Date of Birth/Sex: Treating RN: 09-15-1945 ([address] y.o. Bradley [name]) Bradley [name], Bradley [name] ([id number]) [phone].pdf Page 6 of 10 Primary Care Jaden Batchelder: Bradley [name] Other Clinician: Betha [name] Referring Taige Housman: Treating Malay Fantroy/Extender: Rebecka Apley in Treatment: 198 Active Problems Location of Pain Severity and Description of Pain Patient Has Paino No Site Locations Pain Management and Medication Current Pain Management: Electronic Signature(s) Signed: 05/03/2023 2:52:21 PM By: Yevonne Pax RN Signed: 05/03/2023 5:04:47 PM By: Bradley [name] Entered By: Bradley [name] on 05/03/2023 13:12:05 -------------------------------------------------------------------------------- Patient/Caregiver Education Details Patient Name: Date of Service: Bradley [name]. 11/27/2024andnbsp1:00 PM Medical  Record Number: [id number] Patient Account Number: [id number] Date of Birth/Gender: Treating RN: Jun 15, 1945 (77 y.o. Bradley [name] Primary Care Physician: Bradley [name] Other Clinician: Betha [name] Referring Physician: Treating Physician/Extender: Rebecka Apley in Treatment: 198 Education Assessment Education Provided To: Patient Education Topics Provided Infection: Handouts: Other: go to ED for IV antibiotics Methods: Explain/Verbal Responses: State content correctly Electronic Signature(s) Signed: 05/03/2023 5:04:47 PM By: Clydene Fake 05/03/2023 5:04:47 PM By: Bradley [name] SignedMervyn [name] ([id number]) [id number] Page 7 of 10 Entered By: Bradley [name] on 05/03/2023 17:02:18 -------------------------------------------------------------------------------- Wound Assessment Details Patient Name: Date of Service: Bradley [name]. 05/03/2023 1:00 PM Medical Record Number: [id number] Patient Account Number: [id number] Date of Birth/Sex: Treating RN: 19-Jun-1945 (77 y.o. Bradley [name]) Yevonne Pax Primary Care Cloteal Isaacson: Bradley [name] Other Clinician: Betha [name] Referring Kirill Chatterjee: Treating Wyman Meschke/Extender: Maxwell Caul Weeks in Treatment: 198 Wound Status Wound Number: 10 Primary Pressure Ulcer Etiology: Wound Location: Midline Sacrum Wound Open Wounding Event: Pressure Injury Status: Date Acquired: 06/07/2015 Comorbid Anemia, Hypertension, History of pressure wounds, Weeks Of Treatment: 198 History: Rheumatoid Arthritis, Paraplegia Clustered Wound: No Photos Wound Measurements Length: (  cm) 10 Width: (cm) 4 Depth: (cm) 3.4 Area: (cm) 31.416 Volume: (cm) 106.814 % Reduction in Area: 39% % Reduction in Volume: 65.4% Epithelialization: None Wound Description Classification: Category/Stage IV Wound Margin: Epibole Exudate Amount: Medium Exudate Type: Serosanguineous Exudate Color:  red, brown Foul Odor After Cleansing: No Slough/Fibrino Yes Wound Bed Granulation Amount: Large (67-100%) Exposed Structure Granulation Quality: Red, Pink Fascia Exposed: No Necrotic Amount: Small (1-33%) Fat Layer (Subcutaneous Tissue) Exposed: Yes Necrotic Quality: Adherent Slough Tendon Exposed: No Muscle Exposed: Yes Necrosis of Muscle: No Joint Exposed: No Bone Exposed: No Treatment Notes Wound #10 (Sacrum) Wound Laterality: Midline Bradley [name], Bradley [name] ([id number]) [id number] Page 8 of 10 Cleanser Dakin 16 (oz) 0.25 Discharge Instruction: Use as directed. Peri-Wound Care Topical Primary Dressing Gauze Discharge Instruction: Moisten 2-3 gauze with Dakins and gently pack into wound Secondary Dressing Zetuvit Plus 4x8 (in/in) Secured With Medipore T - 629M Medipore H Soft Cloth Surgical T ape ape, 2x2 (in/yd) Compression Wrap Compression Stockings Add-Ons Electronic Signature(s) Signed: 05/03/2023 2:52:21 PM By: Yevonne Pax RN Signed: 05/03/2023 5:04:47 PM By: Bradley [name] Entered By: Bradley [name] on 05/03/2023 13:27:50 -------------------------------------------------------------------------------- Wound Assessment Details Patient Name: Date of Service: Bradley [name]. 05/03/2023 1:00 PM Medical Record Number: [id number] Patient Account Number: [id number] Date of Birth/Sex: Treating RN: 06-06-1946 (77 y.o. Bradley [name] Primary Care Mirelle Biskup: Bradley [name] Other Clinician: Betha [name] Referring Lamarcus Spira: Treating Weda Baumgarner/Extender: Maxwell Caul Weeks in Treatment: 198 Wound Status Wound Number: 14 Primary Pressure Ulcer Etiology: Wound Location: Left, Posterior Upper Leg Wound Open Wounding Event: Shear/Friction Status: Date Acquired: 11/05/2022 Comorbid Anemia, Hypertension, History of pressure wounds, Weeks Of Treatment: 24 History: Rheumatoid Arthritis, Paraplegia Clustered Wound:  Yes Photos Wound Measurements Length: (cm) 5 Lias, Reco [name] ([id number]) Width: (cm) 2 Depth: (cm) 3.7 Clustered Quantity: 2 Area: (cm) 7.854 Volume: (cm) 29.06 % Reduction in Area: -270.3% [id number] Page 9 of 10 % Reduction in Volume: -138281% Epithelialization: None Tunneling: Yes Position (o'clock): 11 Maximum Distance: (cm) 8.2 Undermining: Yes Starting Position (o'clock): 12 Ending Position (o'clock): 12 Maximum Distance: (cm) 1.3 Wound Description Classification: Category/Stage IV Wound Margin: Thickened Exudate Amount: Medium Exudate Type: Serous Exudate Color: amber Foul Odor After Cleansing: No Slough/Fibrino No Wound Bed Granulation Amount: Large (67-100%) Exposed Structure Granulation Quality: Pink Fascia Exposed: No Necrotic Amount: None Present (0%) Fat Layer (Subcutaneous Tissue) Exposed: Yes Tendon Exposed: No Muscle Exposed: No Joint Exposed: No Bone Exposed: Yes Treatment Notes Wound #14 (Upper Leg) Wound Laterality: Left, Posterior Cleanser Dakin 16 (oz) 0.25 Discharge Instruction: Use as directed. Peri-Wound Care Topical Primary Dressing Gauze Discharge Instruction: Moisten 2-3 gauze with Dakins and gently pack into wound and tunnel Secondary Dressing Zetuvit Plus 4x4 (in/in) Secured With Medipore T - 629M Medipore H Soft Cloth Surgical T ape ape, 2x2 (in/yd) Compression Wrap Compression Stockings Add-Ons Electronic Signature(s) Signed: 05/03/2023 2:52:21 PM By: Yevonne Pax RN Signed: 05/03/2023 5:04:47 PM By: Bradley [name] Entered By: Bradley [name] on 05/03/2023 13:28:51 Vitals Details -------------------------------------------------------------------------------- Bradley [name] ([id number]) [id number] Page 10 of 10 Patient Name: Date of Service: Bradley [name]. 05/03/2023 1:00 PM Medical Record Number: [id number] Patient Account Number: [id number] Date of Birth/Sex:  Treating RN: [birth year] (77 y.o. Bradley [name]) Yevonne Pax Primary Care Chaney Maclaren: Bradley [name] Other Clinician: Betha [name] Referring Dvid Pendry: Treating Daden Mahany/Extender: Maxwell Caul Weeks in Treatment: 198 Vital Signs Time Taken: 13:10 Temperature (F): 98.7 Pulse (bpm): 102 Respiratory Rate (breaths/min): 18 Blood Pressure (mmHg): 102/64 Reference Range: 80 - 120  mg / dl Electronic Signature(s) Signed: 05/03/2023 5:04:47 PM By: Bradley [name] Entered By: Bradley [name] on 05/03/2023 13:12:00

## 2023-05-03 NOTE — Progress Notes (Signed)
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 the overall measurements are concerned in just 1 week's time. This is going be a much longer term process at this point. With that being said I do think that he is very healthy appearing as far as the base of the wound is concerned. 08/06/2019 upon evaluation today patient actually appears to be doing about the same with regard to his wound to be honest. There is really not a significant improvement overall based on what I am seeing today. Fortunately there is no signs  of significant systemic infection. No fevers, chills, nausea, vomiting, or diarrhea. With that being said there is odor to the drainage from the wound and subsequently also what appears to be increased drainage based on what we are Bradley [name], Bradley [name] ([id number]) [id number] Page 4 of 15 seeing today as well as what his home health nurse called [place] about that she was concerned with as well. 08/13/2019 upon evaluation today patient appears to be doing about the same at this point with regard to his wound although I think the dressing may be a little less drainage wise compared to what it was previous. Fortunately there is no signs of active infection at this time. No fevers, chills, nausea, vomiting, or diarrhea. He has been taking the antibiotic for only 3 days. 08/23/2019 upon evaluation today patient appears to be  doing not nearly as well with regard to his wound. In general really not making a lot of progress which is unfortunate. There does not appear to be any signs of active infection at this time. No fevers, chills, nausea, vomiting, or diarrhea. With that being said the patient unfortunately does seem to be experiencing continued epiboly around the edges of the wound as well as significant scar tissue he has been in the majority of his day sitting in his chair which is likely a big reason for all of this. Fortunately there is no signs of active infection at this time. No fevers, chills, nausea, vomiting, or diarrhea. 08/29/2019 upon evaluation today patient's wound actually appears to be showing some signs of improvement. The region of few areas of new skin growth around the edges of the wound even where he has some of the epiboly. This is actually good news and I think that he has been very aggressive and offloading over the past week since we showed him the pictures of his wound last week. There still may be a chance that he is going require some sharp debridement to clear away some of these edges but to be honest I think that is can be quite an undertaking. The main reason is that he has a lot of thickened scar tissue and it is good to bleed quite significantly in my opinion. The I think if organ to do that we may even need to have a portable or disposable cautery in order to make sure that we are able to get the area completely sealed up as far as bleeding is concerned. I do have one that we can utilize. However being that the wound looks so much better right now would like to give this 2 weeks to see how things stand and look at that point before making any additional recommendations. 09/12/2019 upon evaluation today patient appears to be making some progress as far as offloading is concerned. He still has a substantial wound but nonetheless he tells me he is off loading much more aggressively at this  point. This is obviously good news. No fevers, chills, nausea, vomiting, or diarrhea. 4/22; I have not seen this patient in quite some time. No major change in the condition of the wound or its wound volume. Surrounding maceration of the skin around the edges of the wound. The wound is fairly substantial. T close to the anal opening to consider a wound VAC. He had extensive trials of plastic o surgery at [facility] which really does not result in any improvement. His wound bed is however clean 10/10/2019 upon evaluation today patient appears to be doing well with  regard to the wound in the sacral region. He has been tolerating the dressing changes without complication. Fortunately there is no signs of infection and he actually has some epithelial growth noted as well. He tells me has been trying to continue to offload this area which is excellent that is what he needs to do most as far as what he can have in his control. Otherwise I feel like the collagen with a saline moistened gauze behind has been beneficial for him. 5/20; collagen and normal saline wet-to-dry. Although the tissue when this large sacral wound looks reasonable there is been absolutely no benefit to the overall closure. He has macerated skin around this. He tells me that he spends a large part of the day up in the wheelchair. He still has home health 11/07/2019 upon evaluation today patient appears to be doing well with regard to his wounds at this point. Fortunately there is no signs of active infection at this time. Overall I feel like his sacral wound in general appears to be doing quite nicely. I am very pleased with overall how things are progressing and I feel like the patient is making excellent progress towards resolution. With that being said he still has a long ways to go before we expect to see this completely healed. 11/21/2019 upon evaluation today patient appears to be doing about the same in regard to his sacral wound.  Unfortunately he is really not making much in the way of improvement when I questioned him about how much time he is really spending sitting on his bottom he tells me that the majority of his day is mainly in that position though he does spend some time in bed. It does not sound like it is enough however neither does it look like it is enough. 12/24/2019 upon evaluation today patient appears to be doing really about the same there is no significant improvement overall in his wound bed. Last time he was seen here we did have a discussion about the possibility of seeing if I can get a surgeon to evaluate and potentially clear away some of the thickened edges of the wound in order to allow this wound to potentially have a chance of closing with a wound VAC. Right now we could not even get a wound VAC to seal with some of the rolled and thickened edges. With that being said elected to the patient to think about this to discuss today. At this point the patient does want to see if I could make the referral to potentially see if the surgeon would be able to perform this and subsequently he agrees to go to a skilled nursing facility following if they are able to do this in order to allow for a wound VAC and to get this wound to heal more effectively and quickly. In my opinion the only way that I would recommend that he see the surgeon to clear away the edges would be if he is also in agreement with going to skilled nursing facility as I know he cannot appropriately offload at home to take care of this and that setting. 01/07/2020 on evaluation today patient appears to be doing about the same in regard to his wound. There is no signs of active infection at this time which is great news. All in all he is really doing about the same. We have made referral to surgery to see if they can potentially perform debridement to clear away some of the scarred edging of  the wound so we could potentially see about a wound VAC to  try to help this heal more efficiently. Again we have discussed that if this occurs he would be in a skilled nursing facility following or release would need to be in order to appropriately offload. Nonetheless we are still in the process of working that referral. 01/21/2020 upon evaluation today patient appears to be doing about the same in regard to his sacral wound. He did see Dr. Tonna Boehringer Dr. Geoffery Lyons feeling was that there was not anything surgically he could offer at this point as he did not feel like that a wound VAC would likely be appropriate for the patient. With that being said he did recommend potential for trying to get the patient into a skilled nursing facility as an outpatient and they will get me working on that as well. Fortunately there is no signs of active infection at this time. 11/10; this is a patient that has not been seen here in quite a bit of time while we were in our alternative venue. As well I have not seen this patient in probably over 2 years. As I remember things with him he has had this pressure ulcer encompassing his lower sacrum and coccyx and the surrounding buttocks for a prolonged period of time. When he first came here the surface looked fairly good and the wound was smaller we attempted a wound VAC but we could never keep this in place. Because of nonresponse of the wound to topical dressings we referred him to [facility] plastic surgery where preparation was being made for flap closure. Intact he had a colostomy however after that he was deemed to have 2 large wound for flap closure. He did have osteomyelitis in the coccyx area in 2019. He is using wet-to-dry he has home health. They are managing his ostomy supplies as well. The wound is substantially larger than what I remember. Versus last time he was here to wound months or so ago he has an unstageable wound on the left upper thigh posteriorly. He tells me he is in his chair quite a bit especially  recently 12/1; patient comes back to clinic. He did not get the lab work done apparently home health would not do this they referred him to the lab where he is apparently having a CBC checked because he is on iron infusions. We will try to get the lab work I ordered done there. He also did not get the x-ray he says he does not remember the discussion. The issue is that this large wound has probing areas to bone. He may have underlying osteomyelitis again he was treated for this previously I think in 2019 12/15; the patient did have lab work done. Notable for iron deficiency anemia with a hemoglobin of 9.7 although he is following with hematology for this his CRP was 7.1 which is up from his previous value. sedimentation rate of 91 which is also higher. However he maintains a relatively high sedimentation rate. Most disturbingly was an albumin of 2.8 although this is also somewhat higher than the 2.2 we had previously. With some difficulty I think a home health nurse actually made him some Dakin's solution he is using a Dakin's wet-to-dry to the large sacral wound. He has the area on the left upper thigh as well 06/17/2020; I follow this patient on a monthly basis. 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 At one point he had  underlying osteomyelitis here and he received antibiotic therapy. I believe this was done through [facility] We have been using palliative Dakin's solution wet-to-dry on this which is really This very large wound about the same with healthy surface. No active infection that is obvious. He has developed an area on the left posterior thigh and now a mirror image area on the right posterior thigh noted pressure injuries from the wheelchair. This occurs even though he has a Roho cushion which is apparently new I have been forwarded lab work from 05/15/2020. At that point his hemoglobin was 9.2 down from 13.8 on 10/5 this is microcytic hypochromic. He has known iron  deficiency although the exact cause of this is never really been clear to me. He follows with hematology oncology and has been receiving IV iron he is Bradley [name], Bradley [name] ([id number]) [id number] Page 5 of 15 supposed to follow-up in mid January and I told the patient this. From wound care point of view his albumin is 2.9 sedimentation rate is 74. His sedimentation rate has always been elevated in this range however his C- reactive protein was 82 which is way above anything previously measured. I see that on 05/08/2020 this was 7.1 I am not sure why the rapid discrepancy. He is supposed to have an x-ray of his lower sacrum looking for evidence of osteomyelitis that he was previously treated for. Is possible he would also require more advanced imaging such as another C CT or MRI He comes in also with paperwork for a Clinitron air-fluidized bed with a trapeze bar. I asked him if he would be able to transfer in and out of this bed . I am not opposed to ordering it, but I do not want to make his transfers more difficult than they already are. He has a Nurse, adult 07/15/2020; he is managed to get his Clinitron air-fluidized bed. He has a new electric wheelchair with a wheelchair cushion. The x-ray I did showed some sacral bone destruction although this did not seem progressive him last time. He was treated for osteomyelitis in this area previously in 2018. His sedimentation rate is 91 CRP 7.1 albumin 2.8. We are using Dakin's wet-to-dry. He has [facility] home health 4/6; 6-month follow-up. He has a Clinitron air-fluidized bed. He has the wheelchair cushion. His major wound on the coccyx looks somewhat better including including both of [place] my nurse and my feeling that it is closed in somewhat. Wound is still too close to his anal opening to consider a wound VAC. At 1 point we were trying to get him into [facility] to get a flap closure but for 1 reason or another this never moved forward  I was never really certain what the issue was. He has an area on the left upper thigh in the gluteal fold. This is a clean wound. 6/1; 55-month follow-up. I follow this man on a palliative basis he has a chronic stage IV wound over the lower sacrum coccyx and surrounding buttocks. He has an air-fluidized bed and a wheelchair cushion but he spends more time in the wheelchair than he does off this area by his own admission. I do not think the area is really changed that much although it is probably deeper in 1 section. He does tell us that he was discharged from home health because of lack of change of the wound. This was [employer]. He was treated for underlying osteomyelitis several years ago and he may have underlying chronic osteomyelitis. I do  not believe he ever got the x-ray I ordered 8/3; patient presents for his 48-month follow-up. He reports no issues or complaints today. He continues to use Dakin's wet-to-dry dressings to his sacrum and silver alginate to his left lateral thigh wound. He denies signs of infection. 10/5; patient presents for 42-month follow-up. He has no issues or complaints today. He continues to use Dakin's wet-to-dry dressings to his sacrum. He states that the left lateral thigh wound is healed. He denies signs of infection. 12/7; patient presents for 99-month follow-up. He states he would like to obtain a new air loss mattress bed. He states his current one is having technical difficulties. He currently is using Dakin's wet-to-dry dressings. He denies signs of infection. 2/1; patient presents for follow-up. He has no issues or complaints today. He currently uses Dakin's wet-to-dry dressings. He denies signs of infection. He has his new air loss mattress. 4/5; patient presents for follow-up. He has been using Dakin's wet-to-dry dressings. He has no issues or complaints today. He denies signs of infection. 11-10-2021 upon evaluation today patient's wound is doing better since I  last saw him although it has been quite sometime since then to be perfectly honest. In fact I think it may have been even back in 2022 if I am not mistaken. Either way he has been seeing Dr. Mikey Bussing roughly every 2 months per his request. His wound still though smaller does have a significant amount of thick scar tissue surrounding the edge which is making it very difficult to heal to be honest. His son is performing the dressing changes at home currently. 8/23; patient presents for follow-up. He has no issues or complaints today. He has been doing Dakin's wet-to-dry dressings. He is not interested in any further surgical intervention for his sacral wound. He denies signs of infection. 11/15; Patient presents for follow up. He has been using Dakins wet to dry dressings. He has no issues or complaints. 2/7; patient presents for his regular 25-month follow-up. He continues to use Dakin's wet-to-dry dressings. He has no issues or complaints today. 4/10; patient presents for follow-up. He has been using Dakin's wet-to-dry dressings. He has no issues or complaints. He denies signs of infection. 6/12; patient presents for follow-up. He has been using Dakin's wet-to-dry dressings to the sacral wound. He has a small area of skin breakdown to the left posterior leg and has been keeping this covered. He has no issues or complaints today. He denies signs of infection. 6/26; patient presents for follow-up. He has been using Dakin's wet-to-dry dressings to the sacral wound he now has another area of skin breakdown just above the previous left posterior leg wound. Appears to be from friction. He has been using Hydrofera Blue to these wounds. Insurance is asking patient to be seen weekly in order for him to keep his air mattress. After 5 weeks he can go out 2 months per patient Report. 7/3; patient presents for follow-up. He has been using Dakin's wet-to-dry dressings to the sacral wound and Hydrofera Blue to the left  posterior leg wound. 7/10; patient presents for follow-up. He has been using Dakin's wet-to-dry dressings to the sacral wound and Hydrofera Blue with Medihoney to the left posterior leg wound. Wounds are stable. 7/17; patient presents for follow-up. He has been using Dakin's wet-to-dry dressings to the sacral wound and Hydrofera Blue with antibiotic ointment to the left posterior leg wound. He has been sitting up more in his chair this past week. He has no  issues or complaints today. 7/24; patient presents for follow-up. He has been using Dakin's wet-to-dry dressings to the sacral wound and Hydrofera Blue with antibiotic ointment to the left posterior leg wound. He has no issues or complaints. 7/31; patient presents for follow-up. He has been using Dakin's wet-to-dry dressings to the sacral wound and Hydrofera Blue with antibiotic ointment to the left posterior leg wound. Left leg wound has more healthy granulation tissue present today. He has no issues or complaints. He would like a different air mattress. 02-13-2023 upon evaluation patient appears after having been in the hospital this was actually an admission from January 26, 2023 through February 03, 2023. Subsequently it appears that the principal problem here was osteomyelitis of the thigh. During the course the patient had sepsis criteria met with fever of 101.4 heart rate of 112 and respirations of 15. His lactic acid was normal white blood cell count was 14.3 CT scan of the pelvis showed concerning findings for osteomyelitis of left ischium and chronic erosive changes of the sacrococcygeal region. He was admitted to the hospital and started on vancomycin and meropenem under the direction of general surgery the patient underwent debridement of left ischial wound and bone biopsy on 01/28/2023. Infectious disease, Dr. Joylene Draft, was following the patient during the hospitalization. Subsequently he was switched to Rocephin and upon discharge it was  recommended for Levaquin for total of 4 weeks it was originally recommended that the patient should follow-up with infectious disease on February 13, 2023 which is today although that direction [place]. The patient's culture showed Proteus. Upon evaluation today the patient actually appears to have a wound at both locations which is actually measuring larger. With that being said this is completely expected considering the fact that he has been having issues with infection. Nonetheless I do believe that this is gena require aggressive treatment and in fact the new wound is actually significantly deeper than the original sacral wound. There is definitely bone exposed in the base of the wound as well. 02-28-2023 upon evaluation today patient's wounds unfortunately are really doing about the same. This is definitely larger wound than where it was prior to his hospital stay nonetheless I think that he still needs to have an offloading surface and again we are working on trying to get him a bed as documented in last week's note. I still think that this is of utmost importance. I discussed this with the patient he is in agreement of the plan and again were working to try to get this completed as quickly as possible. Bradley [name], Bradley [name] ([id number]) [id number] Page 6 of 15 10/9; this is a patient I know from longstanding stays in this clinic. He has a large wound in the lower sacrum coccyx extending into the left buttock. We worked on this years ago. It was too close to his anal opening to consider a wound VAC. We spent a lot of effort trying to get a plastic surgeon to agree to attempted to [facility] closure of this but for 1 reason or another he never had this done even though they put a colostomy on him in preparation for the flap closure. Since I have last been a lot of time with him he has developed a new stage IV wound in the left buttock. He  apparently was in hospital in September with sepsis and a wound culture showed Proteus he has completed a course of Levaquin. He was seen today  in conjunction with Dr. Joylene Draft who said she is happy with the condition of the wounds and no further antibiotics or cultures are indicated. 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 There is no gross exposed bone in either area and no obvious infection. He spends a considerable number of hours up in his wheelchair although I have asked him to reduce this considerably. We are also trying to get him a level 3 bed surface and a hospital bed which will be important to have any chance of healing this, improving this or indeed keeping him out of facilities. We have been using Vashe wet-to-dry on the wounds 04-19-2023 upon evaluation patient's wound actually is doing decently well at this point with regard to the overall appearance unfortunately has been having some bleeding in his wounds he tells me this started when he got into the new air-fluidized mattress. With that being said based on what I am seeing I do not see any obvious signs of specific trauma that I can account directly for where the blood is coming from. With that being said I definitely note that the patient is having increased bleeding as we see it on the bandaging that this is something the nursing staff confirms has not been that significant in the past. Fortunately I do not see any signs of active infection locally nor systemically which is great news. 05-03-2023 unfortunately patient presents today for evaluation of his wound and is not feeling well at all. He tells me that he is actually having issues here with feeling cold chills and overall general malaise. He also has increased odor with the wound he also  tells me that he is experiencing increased drainage which was obvious today as well the wound on the left ischial location that was draining profusely at this point. This is also where there is bone exposed internally and I feel like that this likely represents a worsening overall of his infection. Electronic Signature(s) Signed: 05/03/2023 2:27:13 PM By: Allen Derry PA-C Entered By: Allen Derry on 05/03/2023 14:27:13 -------------------------------------------------------------------------------- Physical Exam Details Patient Name: Date of Service: Bradley [name] MES A. 05/03/2023 1:00 PM Medical Record Number: [id number] Patient Account Number: [id number] Date of Birth/Sex: Treating RN: 02-13-1946 (77 y.o. Bradley [name] Primary Care Provider: Marisue Ivan Other Clinician: Betha Loa Referring Provider: Treating Provider/Extender: Maxwell Caul Weeks in Treatment: 198 Constitutional Well-nourished and well-hydrated in no acute distress. Respiratory normal breathing without difficulty. Psychiatric this patient is able to make decisions and demonstrates good insight into disease process. Alert and Oriented x 3. pleasant and cooperative. Notes Upon inspection patient's wound bed again showed signs of significant worsening compared to what we seen previous there was bone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 ulcer. He has been using the Dakin's wet-to-dry daily; he continues with home health. He is not accompanied by anyone at this visit. He will follow up in two weeks per his request/preference. 10/12/17 on evaluation today patient appears to be doing very well. The Dakin solution went to dry packings do seem to be helping him as far as the sacral wound is concerned I'm not seeing anything that has me more concerned as far as infection or otherwise is concerned. Overall I'm pleased with the appearance of the wound. 10/26/17-He is here in follow up evaluation for a stage IV sacral ulcer. He continues with daily Dakin's wet-to-dry. He is voicing no complaints or concerns. He will follow-up in 2 weeks 11/16/17-He is here for follow up evaluation for a palliative stage IV sacral pressure ulcer. We will continue with Dakin's wet-to-dry. He will follow-up in 4 weeks. He is expressing concern/complaint regarding new bed that has arrived, stating he is unable to manipulate/maneuver it due to the bed crank being at the foot of the bed. 12/14/17-He is here for evaluation for palliative stage IV sacral ulcer. He is voicing no complaints or concerns. We will continue with one-to-one ratio of saline and dakins. He will follow-up in 3 weeks 01/04/18-He is here in follow-up evaluation for palliative stage IV sacral ulcer. He is inquiring about reinstating the negative pressure wound therapy. We discussed at length that the negative pressure wound therapy in the home setting has not been successful for him repeatedly with loss of cereal and unavailability of 24/7 help; reminded him that home health is not available 24/7 when loss of seal occurs. He does  verbalize understanding to this and does not pursue. We also  discussed the palliative nature of this ulcer (given no significant change/improvement in measurement/appearance, not a candidate for muscle flap per plastic surgery, and continued independent living) and that the goal is for maintenance, decrease in infection and minimizing/avoiding deterioration given that he is independent in his care, does not have home health and requires daily dressing changes secondary to drainage amount. He is inquiring about a wound clinic in [facility], I have informed him that I am unfamiliar with that clinic but that he is encouraged to seek another opinion if that is his desire. We will continue with dakins and he will follow up in three weeks 01/25/18-He is here in follow-up evaluation for palliative stage IV sacral ulcer. He continues with Dakin's/saline 1:1 mixture wet to dry dressing changes. He states he has an appointment at [facility] on 9/17 for evaluation of surgical intervention/closure of the sacral ulcer. He will follow-up here in 4 weeks Readmission: 07/16/2019 upon evaluation today patient appears to be doing really about the same as when he was previously seen here in the wound care center. He most recently was a patient of [name] back when she was still working here in the center but had been referred to [facility] for consideration of a flap. With that being said the surgeon there at [name] stated that this was too large for her flap and they have been attempting to get this smaller in order to be able to proceed with a flap. Nonetheless unfortunately he has had a cycle of going back and forth between the osteomyelitis flaring and then sent him back and then making a little bit of progress only to be sent back again. It sounds like most recently they have been using a Iodoflex type dressing at this point which does not seem to have done any harm by any means. With that being said this wound seems to be quite large for  using Iodoflex throughout and subsequently I think he may do much better with the use of Vioxx moistened gauze which would be safe for the new tissue growing and also keep the wound quite nice and clean. The patient is not opposed to this he in fact states that his home health nurse had mentioned this as a possibility as well. 07/23/2019 upon evaluation today patient actually appears to be doing very well in regard to his sacral wound. In fact this is much healthier the measurements not terribly different but again with a wound like this at home necessarily expect a a lot of change as far as the overall measurements are concerned in just 1 week's time. This is going be a much longer term process at this point. With that being said I do think that he is very healthy appearing as far as the base of the wound is concerned. 08/06/2019 upon evaluation today patient actually appears to be doing about the same with regard to his wound to be honest. There is really not a significant improvement overall based on what I am seeing today. Fortunately there is no signs of significant systemic infection. No fevers, chills, nausea, vomiting, or diarrhea. With that being said there is odor to the drainage from the wound and subsequently also what appears to be increased drainage based on what we are seeing today as well as what his home health nurse called [place] about that she was concerned with as well. 08/13/2019 upon evaluation today patient appears to be doing about the same at this point with regard to his wound although I  think the dressing may be a little less drainage wise compared to what it was previous. Fortunately there is no signs of active infection at this time. No fevers, chills, nausea, vomiting, or diarrhea. He has been taking the antibiotic for only 3 days. 08/23/2019 upon evaluation today patient appears to be doing not nearly as well with regard to his wound. In general really not making a lot of progress  which is unfortunate. There does not appear to be any signs of active infection at this time. No fevers, chills, nausea, vomiting, or diarrhea. With that being said the patient unfortunately does seem to be experiencing continued epiboly around the edges of the wound as well as significant scar tissue he has been in the majority of his day sitting in his chair which is likely a big reason for all of this. Fortunately there is no signs of active infection at this time. No fevers, chills, nausea, vomiting, or diarrhea. 08/29/2019 upon evaluation today patient's wound actually appears to be showing some signs of improvement. The region of few areas of new skin growth around the edges of the wound even where he has some of the epiboly. This is actually good news and I think that he has been very aggressive and offloading over the past week since we showed him the pictures of his wound last week. There still may be a chance that he is going require some sharp debridement to clear away some of these edges but to be honest I think that is can be quite an undertaking. The main reason is that he has a lot of thickened scar tissue and it is good to bleed quite significantly in my opinion. The I think if organ to do that we may even need to have a portable or disposable cautery in order to make sure that we are able to get the area completely sealed up as far as bleeding is concerned. I do have one that we can utilize. However being that the wound looks so much better right now would like to give this 2 weeks to see how things stand and look at that point before making any additional recommendations. 09/12/2019 upon evaluation today patient appears to be making some progress as far as offloading is concerned. He still has a substantial wound but nonetheless he tells me he is off loading much more aggressively at this point. This is obviously good news. No fevers, chills, nausea, vomiting, or diarrhea. 4/22; I have  not seen this patient in quite some time. No major change in the condition of the wound or its wound volume. Surrounding maceration of the skin around the edges of the wound. The wound is fairly substantial. T close to the anal opening to consider a wound VAC. He had extensive trials of plastic o surgery at [facility] which really does not result in any improvement. His wound bed is however clean 10/10/2019 upon evaluation today patient appears to be doing well with regard to the wound in the sacral region. He has been tolerating the dressing changes without complication. Fortunately there is no signs of infection and he actually has some epithelial growth noted as well. He tells me has been trying to continue to offload this area which is excellent that is what he needs to do most as far as what he can have in his control. Otherwise I feel like the collagen with a saline moistened gauze behind has been beneficial for him. 5/20; collagen and normal saline  wet-to-dry. Although the tissue when this large sacral wound looks reasonable there is been absolutely no benefit to the overall closure. He has macerated skin around this. He tells me that he spends a large part of the day up in the wheelchair. He still has home health 11/07/2019 upon evaluation today patient appears to be doing well with regard to his wounds at this point. Fortunately there is no signs of active infection at this time. Overall I feel like his sacral wound in general appears to be doing quite nicely. I am very pleased with overall how things are progressing and I feel like the patient is making excellent progress towards resolution. With that being said he still has a long ways to go before we expect to see this completely healed. Bradley [name], Bradley [name] ([id number]) [id number] Page 12 of 15 11/21/2019 upon evaluation today patient appears to be doing about the same in regard to his sacral wound. Unfortunately he  is really not making much in the way of improvement when I questioned him about how much time he is really spending sitting on his bottom he tells me that the majority of his day is mainly in that position though he does spend some time in bed. It does not sound like it is enough however neither does it look like it is enough. 12/24/2019 upon evaluation today patient appears to be doing really about the same there is no significant improvement overall in his wound bed. Last time he was seen here we did have a discussion about the possibility of seeing if I can get a surgeon to evaluate and potentially clear away some of the thickened edges of the wound in order to allow this wound to potentially have a chance of closing with a wound VAC. Right now we could not even get a wound VAC to seal with some of the rolled and thickened edges. With that being said elected to the patient to think about this to discuss today. At this point the patient does want to see if I could make the referral to potentially see if the surgeon would be able to perform this and subsequently he agrees to go to a skilled nursing facility following if they are able to do this in order to allow for a wound VAC and to get this wound to heal more effectively and quickly. In my opinion the only way that I would recommend that he see the surgeon to clear away the edges would be if he is also in agreement with going to skilled nursing facility as I know he cannot appropriately offload at home to take care of this and that setting. 01/07/2020 on evaluation today patient appears to be doing about the same in regard to his wound. There is no signs of active infection at this time which is great news. All in all he is really doing about the same. We have made referral to surgery to see if they can potentially perform debridement to clear away some of the scarred edging of the wound so we could potentially see about a wound VAC to try to help this  heal more efficiently. Again we have discussed that if this occurs he would be in a skilled nursing facility following or release would need to be in order to appropriately offload. Nonetheless we are still in the process of working that referral. 01/21/2020 upon evaluation today patient appears to be doing about the same in regard to his sacral wound. He did see  Dr. Tonna Boehringer Dr. Geoffery Lyons feeling was that there was not anything surgically he could offer at this point as he did not feel like that a wound VAC would likely be appropriate for the patient. With that being said he did recommend potential for trying to get the patient into a skilled nursing facility as an outpatient and they will get me working on that as well. Fortunately there is no signs of active infection at this time. 11/10; this is a patient that has not been seen here in quite a bit of time while we were in our alternative venue. As well I have not seen this patient in probably over 2 years. As I remember things with him he has had this pressure ulcer encompassing his lower sacrum and coccyx and the surrounding buttocks for a prolonged period of time. When he first came here the surface looked fairly good and the wound was smaller we attempted a wound VAC but we could never keep this in place. Because of nonresponse of the wound to topical dressings we referred him to [facility] plastic surgery where preparation was being made for flap closure. Intact he had a colostomy however after that he was deemed to have 2 large wound for flap closure. He did have osteomyelitis in the coccyx area in 2019. He is using wet-to-dry he has home health. They are managing his ostomy supplies as well. The wound is substantially larger than what I remember. Versus last time he was here to wound months or so ago he has an unstageable wound on the left upper thigh posteriorly. He tells me he is in his chair quite a bit especially recently 12/1; patient  comes back to clinic. He did not get the lab work done apparently home health would not do this they referred him to the lab where he is apparently having a CBC checked because he is on iron infusions. We will try to get the lab work I ordered done there. He also did not get the x-ray he says he does not remember the discussion. The issue is that this large wound has probing areas to bone. He may have underlying osteomyelitis again he was treated for this previously I think in 2019 12/15; the patient did have lab work done. Notable for iron deficiency anemia with a hemoglobin of 9.7 although he is following with hematology for this his CRP was 7.1 which is up from his previous value. sedimentation rate of 91 which is also higher. However he maintains a relatively high sedimentation rate. Most disturbingly was an albumin of 2.8 although this is also somewhat higher than the 2.2 we had previously. With some difficulty I think a home health nurse actually made him some Dakin's solution he is using a Dakin's wet-to-dry to the large sacral wound. He has the area on the left upper thigh as well 06/17/2020; I follow this patient on a monthly basis. 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 At one point he had underlying osteomyelitis here and he received antibiotic therapy. I believe this was done through [facility] We have been using palliative Dakin's solution wet-to-dry on this which is really This very large wound about the same with healthy surface. No active infection that is obvious. He has developed an area on the left posterior thigh and now a mirror image area on the right posterior thigh noted pressure injuries from the wheelchair. 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 At that point his hemoglobin was 9.2 down from 13.8 on 10/5 this is microcytic hypochromic. He has known iron deficiency although the  exact cause of this is never really been clear to me. He follows with hematology oncology and has been receiving IV iron he is supposed to follow-up in mid January and I told the patient this. From wound care point of view his albumin is 2.9 sedimentation rate is 74. His sedimentation rate has always been elevated in this range however his C- reactive protein was 82 which is way above anything previously measured. I see that on 05/08/2020 this was 7.1 I am not sure why the rapid discrepancy. He is supposed to have an x-ray of his lower sacrum looking for evidence of osteomyelitis that he was previously treated for. Is possible he would also require more advanced imaging such as another C CT or MRI He comes in also with paperwork for a Cl[employer]ron air-fluidized bed with a trapeze bar. I asked him if he would be able to transfer in and out of this bed . I am not opposed to ordering it, but I do not want to make his transfers more difficult than they already are. He has a Nurse, adult 07/15/2020; he is managed to get his Clinitron air-fluidized bed. He has a new electric wheelchair with a wheelchair cushion. The x-ray I did showed some sacral bone destruction although this did not seem progressive him last time. He was treated for osteomyelitis in this area previously in 2018. His sedimentation rate is 91 CRP 7.1 albumin 2.8. We are using Dakin's wet-to-dry. He has [facility] home health 4/6; 34-month follow-up. He has a Cl[place]ron air-fluidized bed. He has the wheelchair cushion. His major wound on the coccyx looks somewhat better including including both of [place] my nurse and my feeling that it is closed in somewhat. Wound is still too close to his anal opening to consider a wound VAC. At 1 point we were trying to get him into [facility] to get a flap closure but for 1 reason or another this never moved forward I was never really certain what the issue was. He has an area on the left upper thigh in the gluteal  fold. This is a clean wound. 6/1; 32-month follow-up. I follow this man on a palliative basis he has a chronic stage IV wound over the lower sacrum coccyx and surrounding buttocks. He has an air-fluidized bed and a wheelchair cushion but he spends more time in the wheelchair than he does off this area by his own admission. I do not think the area is really changed that much although it is probably deeper in 1 section. He does tell us that he was discharged from home health because of lack of change of the wound. This was [employer]. He was treated for underlying osteomyelitis several years ago and he may have underlying chronic osteomyelitis. I do not believe he ever got the x-ray I ordered 8/3; patient presents for his 60-month follow-up. He reports no issues or complaints today. He continues to use Dakin's wet-to-dry dressings to his sacrum and silver alginate to his left lateral thigh wound. He denies signs of infection. 10/5; patient presents for 71-month follow-up. He has no issues or complaints today. He continues to use Dakin's wet-to-dry dressings to his sacrum. He states that the left lateral thigh wound is healed. He denies signs of infection. 12/7; patient presents for  102-month follow-up. He states he would like to obtain a new air loss mattress bed. He states his current one is having technical difficulties. He currently is using Dakin's wet-to-dry dressings. He denies signs of infection. 2/1; patient presents for follow-up. He has no issues or complaints today. He currently uses Dakin's wet-to-dry dressings. He denies signs of infection. He has MARJORIE, BEHANNA ([id number]) [id number] Page 13 of 15 his new air loss mattress. 4/5; patient presents for follow-up. He has been using Dakin's wet-to-dry dressings. He has no issues or complaints today. He denies signs of infection. 11-10-2021 upon evaluation today patient's wound is doing better since I last saw him although  it has been quite sometime since then to be perfectly honest. In fact I think it may have been even back in 2022 if I am not mistaken. Either way he has been seeing Dr. Mikey Bussing roughly every 2 months per his request. His wound still though smaller does have a significant amount of thick scar tissue surrounding the edge which is making it very difficult to heal to be honest. His son is performing the dressing changes at home currently. 8/23; patient presents for follow-up. He has no issues or complaints today. He has been doing Dakin's wet-to-dry dressings. He is not interested in any further surgical intervention for his sacral wound. He denies signs of infection. 11/15; Patient presents for follow up. He has been using Dakins wet to dry dressings. He has no issues or complaints. 2/7; patient presents for his regular 17-month follow-up. He continues to use Dakin's wet-to-dry dressings. He has no issues or complaints today. 4/10; patient presents for follow-up. He has been using Dakin's wet-to-dry dressings. He has no issues or complaints. He denies signs of infection. 6/12; patient presents for follow-up. He has been using Dakin's wet-to-dry dressings to the sacral wound. He has a small area of skin breakdown to the left posterior leg and has been keeping this covered. He has no issues or complaints today. He denies signs of infection. 6/26; patient presents for follow-up. He has been using Dakin's wet-to-dry dressings to the sacral wound he now has another area of skin breakdown just above the previous left posterior leg wound. Appears to be from friction. He has been using Hydrofera Blue to these wounds. Insurance is asking patient to be seen weekly in order for him to keep his air mattress. After 5 weeks he can go out 2 months per patient Report. 7/3; patient presents for follow-up. He has been using Dakin's wet-to-dry dressings to the sacral wound and Hydrofera Blue to the left posterior leg  wound. 7/10; patient presents for follow-up. He has been using Dakin's wet-to-dry dressings to the sacral wound and Hydrofera Blue with Medihoney to the left posterior leg wound. Wounds are stable. 7/17; patient presents for follow-up. He has been using Dakin's wet-to-dry dressings to the sacral wound and Hydrofera Blue with antibiotic ointment to the left posterior leg wound. He has been sitting up more in his chair this past week. He has no issues or complaints today. 7/24; patient presents for follow-up. He has been using Dakin's wet-to-dry dressings to the sacral wound and Hydrofera Blue with antibiotic ointment to the left posterior leg wound. He has no issues or complaints. 7/31; patient presents for follow-up. He has been using Dakin's wet-to-dry dressings to the sacral wound and Hydrofera Blue with antibiotic ointment to the left posterior leg wound. Left leg wound has more healthy granulation tissue present today. He has no issues  or complaints. He would like a different air mattress. 02-13-2023 upon evaluation patient appears after having been in the hospital this was actually an admission from January 26, 2023 through February 03, 2023. Subsequently it appears that the principal problem here was osteomyelitis of the thigh. During the course the patient had sepsis criteria met with fever of 101.4 heart rate of 112 and respirations of 15. His lactic acid was normal white blood cell count was 14.3 CT scan of the pelvis showed concerning findings for osteomyelitis of left ischium and chronic erosive changes of the sacrococcygeal region. He was admitted to the hospital and started on vancomycin and meropenem under the direction of general surgery the patient underwent debridement of left ischial wound and bone biopsy on 01/28/2023. Infectious disease, Dr. Joylene Draft, was following the patient during the hospitalization. Subsequently he was switched to Rocephin and upon discharge it was recommended  for Levaquin for total of 4 weeks it was originally recommended that the patient should follow-up with infectious disease on February 13, 2023 which is today although that direction car [place] point. The patient's culture showed Proteus. Upon evaluation today the patient actually appears to have a wound at both locations which is actually measuring larger. With that being said this is completely expected considering the fact that he has been having issues with infection. Nonetheless I do believe that this is gena require aggressive treatment and in fact the new wound is actually significantly deeper than the original sacral wound. There is definitely bone exposed in the base of the wound as well. 02-28-2023 upon evaluation today patient's wounds unfortunately are really doing about the same. This is definitely larger wound than where it was prior to his hospital stay nonetheless I think that he still needs to have an offloading surface and again we are working on trying to get him a bed as documented in last week's note. I still think that this is of utmost importance. I discussed this with the patient he is in agreement of the plan and again were working to try to get this completed as quickly as possible. 10/9; this is a patient I know from longstanding stays in this clinic. He has a large wound in the lower sacrum coccyx extending into the left buttock. We worked on this years ago. It was too close to his anal opening to consider a wound VAC. We spent a lot of effort trying to get a plastic surgeon to agree to attempted to [facility] closure of this but for 1 reason or another he never had this done even though they put a colostomy on him in preparation for the flap closure. Since I have last been a lot of time with him he has developed a new stage IV wound in the left buttock. He apparently was in hospital in September with sepsis and a wound culture showed Proteus he has  completed a course of Levaquin. He was seen today in conjunction with Dr. Joylene Draft who said she is happy with the condition of the wounds and no further antibiotics or cultures are indicated. 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 There is no gross exposed bone in either area and no obvious infection. He spends a considerable number of hours up in his wheelchair although I  have asked him to reduce this considerably. We are also trying to get him a level 3 bed surface and a hospital bed which will be important to have any chance of healing this, improving this or indeed keeping him out of facilities. We have been using Vashe wet-to-dry on the wounds 04-19-2023 upon evaluation patient's wound actually is doing decently well at this point with regard to the overall appearance unfortunately has been having some bleeding in his wounds he tells me this started when he got into the new air-fluidized mattress. With that being said based on what I am seeing I do not see any obvious signs of specific trauma that I can account directly for where the blood is coming from. With that being said I definitely note that the patient is having increased bleeding as we see it on the bandaging that this is something the nursing staff confirms has not been that significant in the past. Fortunately I do not see any signs of active infection locally nor systemically which is great news. 05-03-2023 unfortunately patient presents today for evaluation of his wound and is not feeling well at all. He tells me that he is actually having issues here with feeling cold chills and overall general malaise. He also has increased odor with the wound he also tells me that he is experiencing increased drainage which was obvious today as well the wound  on the left ischial location that was draining profusely at this point. This is also where there is bone exposed internally and I feel like that this likely represents a worsening overall of his infection. Objective Constitutional Bradley [name], Bradley [name] ([id number]) [id number] Page 14 of 15 Well-nourished and well-hydrated in no acute distress. Vitals Time Taken: 1:10 PM, Temperature: 98.7 F, Pulse: 102 bpm, Respiratory Rate: 18 breaths/min, Blood Pressure: 102/64 mmHg. Respiratory normal breathing without difficulty. Psychiatric this patient is able to make decisions and demonstrates good insight into disease process. Alert and Oriented x 3. pleasant and cooperative. General Notes: Upon inspection patient's wound bed again showed signs of significant worsening compared to what we seen previous there was bone exposed in the left ischial location and I do believe that this is likely representative of a continued and possibly even worsening osteomyelitis as there was not this much bone noted last time I saw him. I would recommend at this point that we go ahead and see about get him over to the hospital for evaluation. Integumentary (Hair, Skin) Wound #10 status is Open. Original cause of wound was Pressure Injury. The date acquired was: 06/07/2015. The wound has been in treatment 198 weeks. The wound is located on the Midline Sacrum. The wound measures 10cm length x 4cm width x 3.4cm depth; 31.416cm^2 area and 106.814cm^3 volume. There is muscle and Fat Layer (Subcutaneous Tissue) exposed. There is a medium amount of serosanguineous drainage noted. The wound margin is epibole. There is large (67-100%) red, [name] granulation within the wound bed. There is a small (1-33%) amount of necrotic tissue within the wound bed including Adherent Slough. Wound #14 status is Open. Original cause of wound was Shear/Friction. The date acquired was: 11/05/2022. The wound has been in treatment 24  weeks. The wound is located on the Left,Posterior Upper Leg. The wound measures 5cm length x 2cm width x 3.7cm depth; 7.854cm^2 area and 29.06cm^3 volume. There is bone and Fat Layer (Subcutaneous Tissue) exposed. Tunneling has been noted at 11:00 with a maximum distance of 8.2cm. Undermining begins at 12:00 and ends at  12:00 with a maximum distance of 1.3cm. There is a medium amount of serous drainage noted. The wound margin is thickened. There is large (67- 100%) [name] granulation within the wound bed. There is no necrotic tissue within the wound bed. Assessment Active Problems ICD-10 Pressure ulcer of sacral region, stage 4 Pressure ulcer of other site, stage 4 Other chronic osteomyelitis, other site Paraplegia, unspecified Plan Follow-up Appointments: Return Appointment in 2 weeks. Home Health: Home Health Company: - [facility] for wound care. May utilize formulary equivalent dressing for wound treatment orders unless otherwise specified. Home Health Nurse may visit PRN to address patients wound care needs. Frances Furbish [phone] **Please direct any NON-WOUND related issues/requests for orders to patient's Primary Care Physician. **If current dressing causes regression in wound condition, may D/C ordered dressing product/s and apply Normal Saline Moist Dressing daily until next Wound Healing Center or Other MD appointment. **Notify Wound Healing Center of regression in wound condition at [phone]. Other Home Health Orders/Instructions: Bathing/ Shower/ Hygiene: May shower; gently cleanse wound with antibacterial soap, rinse and pat dry prior to dressing wounds Off-Loading: Roho cushion for wheelchair Low air-loss mattress (Group 2) Turn and reposition every 2 hours Additional Orders / Instructions: Go to Emergency Department of your choice for evaluation and treatment. WOUND #10: - Sacrum Wound Laterality: Midline Cleanser: Dakin 16 (oz) 0.25 1 x Per Day/30  Days Discharge Instructions: Use as directed. Prim Dressing: Gauze (Generic) 1 x Per Day/30 Days ary Discharge Instructions: Moisten 2-3 gauze with Dakins and gently pack into wound Secondary Dressing: Zetuvit Plus 4x8 (in/in) 1 x Per Day/30 Days Secured With: Medipore T - 52M Medipore H Soft Cloth Surgical T ape ape, 2x2 (in/yd) (Generic) 1 x Per Day/30 Days WOUND #14: - Upper Leg Wound Laterality: Left, Posterior Cleanser: Dakin 16 (oz) 0.25 1 x Per Day/30 Days Discharge Instructions: Use as directed. Prim Dressing: Gauze (Generic) 1 x Per Day/30 Days ary Discharge Instructions: Moisten 2-3 gauze with Dakins and gently pack into wound and tunnel Secondary Dressing: Zetuvit Plus 4x4 (in/in) 1 x Per Day/30 Days Secured With: Medipore T - 52M Medipore H Soft Cloth Surgical T ape ape, 2x2 (in/yd) (Generic) 1 x Per Day/30 Days 1. I would recommend that we go ahead and send the patient over to the ER for further evaluation due to cold chills, general malaise, increased drainage from the left ischial wound, and increased odor also from this left ischial wound. There is bone exposed in the base of the wound in the area of undermining and this I do believe as well he has evidence of worsening overall infection he has had osteomyelitis I do not think this has been fully treated to be partly on his. 2. I am good recommend as well that we should get him over to the ER fortunately transportation is going to take him over I think he is stable to get there without any problems of medicine and with a copy of this note as well. Bradley [name], LACOURT ([id number]) [id number] Page 15 of 15 Will plan to see him back for reevaluation in 2 weeks depending on the ER evaluation and where things stand and go. We will see patient back for reevaluation in 1 week here in the clinic. If anything worsens or changes patient will contact our office for additional recommendations. Electronic  Signature(s) Signed: 05/03/2023 2:28:30 PM By: Allen Derry PA-C Entered By: Allen Derry on 05/03/2023 14:28:30 -------------------------------------------------------------------------------- SuperBill Details Patient Name: Date of Service: Vernell Leep,  JA MES A. 05/03/2023 Medical Record Number: [id number] Patient Account Number: [id number] Date of Birth/Sex: Treating RN: [birth year] (77 y.o. [name]) Yevonne Pax Primary Care Provider: Marisue Ivan Other Clinician: Betha Loa Referring Provider: Treating Provider/Extender: Maxwell Caul Weeks in Treatment: 198 Diagnosis Coding ICD-10 Codes Code Description L89.154 Pressure ulcer of sacral region, stage 4 L89.894 Pressure ulcer of other site, stage 4 M86.68 Other chronic osteomyelitis, other site G82.20 Paraplegia, unspecified Facility Procedures : CPT4 Code: 44034742 Description: 99214 - WOUND CARE VISIT-LEV 4 EST PT Modifier: Quantity: 1 Physician Procedures : CPT4 Code Description Modifier [id number] - WC PHYS LEVEL 5 - EST PT ICD-10 Diagnosis Description L89.154 Pressure ulcer of sacral region, stage 4 L89.894 Pressure ulcer of other site, stage 4 M86.68 Other chronic osteomyelitis, other site G82.20  Paraplegia, unspecified Quantity: 1 Electronic Signature(s) Signed: 05/03/2023 2:28:54 PM By: Allen Derry PA-C Entered By: Allen Derry on 05/03/2023 14:28:53

## 2023-05-04 ENCOUNTER — Emergency Department: Payer: Medicare HMO

## 2023-05-04 ENCOUNTER — Encounter: Payer: Self-pay | Admitting: Internal Medicine

## 2023-05-04 DIAGNOSIS — N390 Urinary tract infection, site not specified: Secondary | ICD-10-CM | POA: Diagnosis not present

## 2023-05-04 DIAGNOSIS — L89154 Pressure ulcer of sacral region, stage 4: Secondary | ICD-10-CM

## 2023-05-04 DIAGNOSIS — Z888 Allergy status to other drugs, medicaments and biological substances status: Secondary | ICD-10-CM | POA: Diagnosis not present

## 2023-05-04 DIAGNOSIS — D509 Iron deficiency anemia, unspecified: Secondary | ICD-10-CM | POA: Diagnosis present

## 2023-05-04 DIAGNOSIS — Z87891 Personal history of nicotine dependence: Secondary | ICD-10-CM | POA: Diagnosis not present

## 2023-05-04 DIAGNOSIS — R6883 Chills (without fever): Secondary | ICD-10-CM | POA: Diagnosis not present

## 2023-05-04 DIAGNOSIS — E872 Acidosis, unspecified: Secondary | ICD-10-CM | POA: Diagnosis present

## 2023-05-04 DIAGNOSIS — Z87440 Personal history of urinary (tract) infections: Secondary | ICD-10-CM | POA: Diagnosis not present

## 2023-05-04 DIAGNOSIS — E876 Hypokalemia: Secondary | ICD-10-CM | POA: Diagnosis present

## 2023-05-04 DIAGNOSIS — N3000 Acute cystitis without hematuria: Secondary | ICD-10-CM | POA: Diagnosis present

## 2023-05-04 DIAGNOSIS — M4628 Osteomyelitis of vertebra, sacral and sacrococcygeal region: Secondary | ICD-10-CM | POA: Diagnosis present

## 2023-05-04 DIAGNOSIS — N319 Neuromuscular dysfunction of bladder, unspecified: Secondary | ICD-10-CM | POA: Diagnosis present

## 2023-05-04 DIAGNOSIS — S31000A Unspecified open wound of lower back and pelvis without penetration into retroperitoneum, initial encounter: Secondary | ICD-10-CM | POA: Diagnosis not present

## 2023-05-04 DIAGNOSIS — I10 Essential (primary) hypertension: Secondary | ICD-10-CM | POA: Diagnosis present

## 2023-05-04 DIAGNOSIS — R6520 Severe sepsis without septic shock: Secondary | ICD-10-CM | POA: Diagnosis present

## 2023-05-04 DIAGNOSIS — G8220 Paraplegia, unspecified: Secondary | ICD-10-CM | POA: Diagnosis present

## 2023-05-04 DIAGNOSIS — E871 Hypo-osmolality and hyponatremia: Secondary | ICD-10-CM | POA: Diagnosis present

## 2023-05-04 DIAGNOSIS — Z79899 Other long term (current) drug therapy: Secondary | ICD-10-CM | POA: Diagnosis not present

## 2023-05-04 DIAGNOSIS — A419 Sepsis, unspecified organism: Secondary | ICD-10-CM | POA: Diagnosis present

## 2023-05-04 DIAGNOSIS — G8250 Quadriplegia, unspecified: Secondary | ICD-10-CM | POA: Diagnosis present

## 2023-05-04 LAB — GLUCOSE, CAPILLARY
Glucose-Capillary: 89 mg/dL (ref 70–99)
Glucose-Capillary: 112 mg/dL — ABNORMAL HIGH (ref 70–99)

## 2023-05-04 LAB — URINALYSIS, COMPLETE (UACMP) WITH MICROSCOPIC
Bilirubin Urine: NEGATIVE
Glucose, UA: NEGATIVE mg/dL
Ketones, ur: 5 mg/dL — AB
Nitrite: POSITIVE — AB
Protein, ur: 30 mg/dL — AB
Specific Gravity, Urine: 1.024 (ref 1.005–1.030)
WBC, UA: >50 WBC/hpf (ref 0–5)
pH: 5 (ref 5.0–8.0)

## 2023-05-04 LAB — CBC WITH DIFFERENTIAL/PLATELET
Abs Immature Granulocytes: 0.13 10*3/uL — ABNORMAL HIGH (ref 0.00–0.07)
Basophils Absolute: 0 10*3/uL (ref 0.0–0.1)
Basophils Relative: 0 %
Eosinophils Absolute: 0 10*3/uL (ref 0.0–0.5)
Eosinophils Relative: 0 %
HCT: 28.7 % — ABNORMAL LOW (ref 39.0–52.0)
Hemoglobin: 9 g/dL — ABNORMAL LOW (ref 13.0–17.0)
Immature Granulocytes: 1 %
Lymphocytes Relative: 13 %
Lymphs Abs: 2.3 10*3/uL (ref 0.7–4.0)
MCH: 22.7 pg — ABNORMAL LOW (ref 26.0–34.0)
MCHC: 31.4 g/dL (ref 30.0–36.0)
MCV: 72.3 fL — ABNORMAL LOW (ref 80.0–100.0)
Monocytes Absolute: 2 10*3/uL — ABNORMAL HIGH (ref 0.1–1.0)
Monocytes Relative: 12 %
Neutro Abs: 12.8 10*3/uL — ABNORMAL HIGH (ref 1.7–7.7)
Neutrophils Relative %: 74 %
Platelets: 396 10*3/uL (ref 150–400)
RBC: 3.97 MIL/uL — ABNORMAL LOW (ref 4.22–5.81)
RDW: 17 % — ABNORMAL HIGH (ref 11.5–15.5)
WBC: 17.3 10*3/uL — ABNORMAL HIGH (ref 4.0–10.5)
nRBC: 0 % (ref 0.0–0.2)

## 2023-05-04 LAB — PHOSPHORUS: Phosphorus: 3.1 mg/dL (ref 2.5–4.6)

## 2023-05-04 LAB — MAGNESIUM: Magnesium: 2.2 mg/dL (ref 1.7–2.4)

## 2023-05-04 LAB — COMPREHENSIVE METABOLIC PANEL
ALT: 11 U/L (ref 0–44)
AST: 19 U/L (ref 15–41)
Albumin: 2.2 g/dL — ABNORMAL LOW (ref 3.5–5.0)
Alkaline Phosphatase: 69 U/L (ref 38–126)
Anion gap: 10 (ref 5–15)
BUN: 12 mg/dL (ref 8–23)
CO2: 25 mmol/L (ref 22–32)
Calcium: 8 mg/dL — ABNORMAL LOW (ref 8.9–10.3)
Chloride: 98 mmol/L (ref 98–111)
Creatinine, Ser: 0.6 mg/dL — ABNORMAL LOW (ref 0.61–1.24)
GFR, Estimated: >60 mL/min (ref >60)
Glucose, Bld: 91 mg/dL (ref 70–99)
Potassium: 3 mmol/L — ABNORMAL LOW (ref 3.5–5.1)
Sodium: 133 mmol/L — ABNORMAL LOW (ref 135–145)
Total Bilirubin: 0.8 mg/dL (ref <1.2)
Total Protein: 7 g/dL (ref 6.5–8.1)

## 2023-05-04 LAB — LACTIC ACID, PLASMA
Lactic Acid, Venous: 0.9 mmol/L (ref 0.5–1.9)
Lactic Acid, Venous: 1.3 mmol/L (ref 0.5–1.9)

## 2023-05-04 LAB — PROTIME-INR
INR: 1.3 — ABNORMAL HIGH (ref 0.8–1.2)
Prothrombin Time: 16.6 s — ABNORMAL HIGH (ref 11.4–15.2)

## 2023-05-04 LAB — CBG MONITORING, ED: Glucose-Capillary: 98 mg/dL (ref 70–99)

## 2023-05-04 MED ORDER — FLEET ENEMA RE ENEM: 1.0000 | ENEMA | Freq: Once | RECTAL | Status: DC | PRN

## 2023-05-04 MED ORDER — OXYCODONE HCL 5 MG PO TABS: 5.0000 mg | ORAL_TABLET | ORAL | Status: DC | PRN

## 2023-05-04 MED ORDER — ENOXAPARIN SODIUM 40 MG/0.4ML IJ SOSY
40.0000 mg | PREFILLED_SYRINGE | INTRAMUSCULAR | Status: DC
  Administered 2023-05-04 – 2023-05-08 (×5): 40 mg via SUBCUTANEOUS
  Filled 2023-05-04 (×5): qty 0.4

## 2023-05-04 MED ORDER — HYDRALAZINE HCL 20 MG/ML IJ SOLN: 5.0000 mg | Freq: Four times a day (QID) | INTRAMUSCULAR | Status: DC | PRN

## 2023-05-04 MED ORDER — MORPHINE SULFATE (PF) 2 MG/ML IV SOLN: 2.0000 mg | INTRAVENOUS | Status: DC | PRN

## 2023-05-04 MED ORDER — IOHEXOL 300 MG/ML  SOLN
100.0000 mL | Freq: Once | INTRAMUSCULAR | Status: AC | PRN
  Administered 2023-05-04: 100 mL via INTRAVENOUS

## 2023-05-04 MED ORDER — VANCOMYCIN HCL 2000 MG/400ML IV SOLN
2000.0000 mg | Freq: Once | INTRAVENOUS | Status: AC
  Administered 2023-05-04: 2000 mg via INTRAVENOUS
  Filled 2023-05-04: qty 400

## 2023-05-04 MED ORDER — FERROUS SULFATE 325 (65 FE) MG PO TABS
325.0000 mg | ORAL_TABLET | Freq: Two times a day (BID) | ORAL | Status: DC
  Administered 2023-05-04 – 2023-05-08 (×9): 325 mg via ORAL
  Filled 2023-05-04 (×9): qty 1

## 2023-05-04 MED ORDER — SODIUM CHLORIDE 0.9 % IV SOLN
2.0000 g | Freq: Three times a day (TID) | INTRAVENOUS | Status: DC
  Administered 2023-05-04 – 2023-05-08 (×12): 2 g via INTRAVENOUS
  Filled 2023-05-04 (×14): qty 12.5

## 2023-05-04 MED ORDER — SENNA 8.6 MG PO TABS
1.0000 | ORAL_TABLET | Freq: Two times a day (BID) | ORAL | Status: DC
  Administered 2023-05-04 – 2023-05-08 (×9): 8.6 mg via ORAL
  Filled 2023-05-04 (×9): qty 1

## 2023-05-04 MED ORDER — ACETAMINOPHEN 650 MG RE SUPP: 650.0000 mg | Freq: Four times a day (QID) | RECTAL | Status: DC | PRN

## 2023-05-04 MED ORDER — ONDANSETRON HCL 4 MG/2ML IJ SOLN: 4.0000 mg | Freq: Four times a day (QID) | INTRAMUSCULAR | Status: DC | PRN

## 2023-05-04 MED ORDER — VITAMIN C 500 MG PO TABS
500.0000 mg | ORAL_TABLET | Freq: Two times a day (BID) | ORAL | Status: DC
  Administered 2023-05-04 – 2023-05-08 (×9): 500 mg via ORAL
  Filled 2023-05-04 (×9): qty 1

## 2023-05-04 MED ORDER — LACTATED RINGERS IV BOLUS (SEPSIS)
1000.0000 mL | Freq: Once | INTRAVENOUS | Status: AC
  Administered 2023-05-04: 1000 mL via INTRAVENOUS

## 2023-05-04 MED ORDER — SODIUM CHLORIDE 0.9 % IV SOLN: INTRAVENOUS | Status: DC

## 2023-05-04 MED ORDER — POLYETHYLENE GLYCOL 3350 17 GM/SCOOP PO POWD: 17.0000 g | Freq: Every day | ORAL | Status: DC | PRN

## 2023-05-04 MED ORDER — SODIUM CHLORIDE 0.9% FLUSH
3.0000 mL | Freq: Two times a day (BID) | INTRAVENOUS | Status: DC
  Administered 2023-05-04 – 2023-05-08 (×9): 3 mL via INTRAVENOUS

## 2023-05-04 MED ORDER — POTASSIUM CHLORIDE CRYS ER 20 MEQ PO TBCR
40.0000 meq | EXTENDED_RELEASE_TABLET | Freq: Once | ORAL | Status: AC
  Administered 2023-05-04: 40 meq via ORAL
  Filled 2023-05-04: qty 2

## 2023-05-04 MED ORDER — ADULT MULTIVITAMIN W/MINERALS CH
1.0000 | ORAL_TABLET | Freq: Every day | ORAL | Status: DC
  Administered 2023-05-04 – 2023-05-08 (×5): 1 via ORAL
  Filled 2023-05-04 (×5): qty 1

## 2023-05-04 MED ORDER — POTASSIUM CHLORIDE CRYS ER 10 MEQ PO TBCR
40.0000 meq | EXTENDED_RELEASE_TABLET | Freq: Once | ORAL | Status: AC
  Administered 2023-05-04: 40 meq via ORAL
  Filled 2023-05-04: qty 4

## 2023-05-04 MED ORDER — VANCOMYCIN HCL IN DEXTROSE 1-5 GM/200ML-% IV SOLN: 1000.0000 mg | Freq: Once | INTRAVENOUS | Status: DC

## 2023-05-04 MED ORDER — SODIUM CHLORIDE 0.9 % IV SOLN
2.0000 g | Freq: Once | INTRAVENOUS | Status: AC
  Administered 2023-05-04: 2 g via INTRAVENOUS
  Filled 2023-05-04: qty 20

## 2023-05-04 MED ORDER — SODIUM CHLORIDE 0.9 % IV SOLN
1.0000 g | Freq: Three times a day (TID) | INTRAVENOUS | Status: DC
  Administered 2023-05-04: 1 g via INTRAVENOUS
  Filled 2023-05-04: qty 10

## 2023-05-04 MED ORDER — POTASSIUM CHLORIDE CRYS ER 20 MEQ PO TBCR: 40.0000 meq | EXTENDED_RELEASE_TABLET | Freq: Once | ORAL | Status: DC

## 2023-05-04 MED ORDER — VANCOMYCIN HCL 1250 MG/250ML IV SOLN
1250.0000 mg | Freq: Two times a day (BID) | INTRAVENOUS | Status: DC
  Administered 2023-05-04 – 2023-05-06 (×4): 1250 mg via INTRAVENOUS
  Filled 2023-05-04 (×5): qty 250

## 2023-05-04 MED ORDER — ONDANSETRON HCL 4 MG PO TABS: 4.0000 mg | ORAL_TABLET | Freq: Four times a day (QID) | ORAL | Status: DC | PRN

## 2023-05-04 MED ORDER — LACTATED RINGERS IV BOLUS (SEPSIS)
1000.0000 mL | Freq: Once | INTRAVENOUS | Status: AC
Start: 2023-05-04 — End: 2023-05-04
  Administered 2023-05-04: 1000 mL via INTRAVENOUS

## 2023-05-04 MED ORDER — SORBITOL 70 % SOLN: 30.0000 mL | Freq: Every day | Status: DC | PRN

## 2023-05-04 MED ORDER — ACETAMINOPHEN 325 MG PO TABS: 650.0000 mg | ORAL_TABLET | Freq: Four times a day (QID) | ORAL | Status: DC | PRN

## 2023-05-04 MED ORDER — ACETAMINOPHEN 500 MG PO TABS
1000.0000 mg | ORAL_TABLET | Freq: Once | ORAL | Status: AC
  Administered 2023-05-04: 1000 mg via ORAL
  Filled 2023-05-04: qty 2

## 2023-05-04 MED ORDER — ALBUTEROL SULFATE (2.5 MG/3ML) 0.083% IN NEBU: 2.5000 mg | INHALATION_SOLUTION | RESPIRATORY_TRACT | Status: DC | PRN

## 2023-05-04 NOTE — ED Notes (Signed)
Pt reclining on stretcher asleep with respirations regular, even and unlabored. Awaiting admission.

## 2023-05-04 NOTE — ED Notes (Signed)
Pt cleaned up and repositioned in bed. Mepilex applied to wounds on bottom. Bed pads and linens changed. Clean socks applied. Colostomy emptied

## 2023-05-04 NOTE — Progress Notes (Signed)
Pharmacy Antibiotic Note  Bradley [name] is a 77 y.o. male admitted on 05/03/2023 with osteomyelitis.  Pharmacy has been consulted for Vancomycin dosing.  Plan: Pt given Vancomycin 2000 mg once. Vancomycin 1250 mg IV Q 12 hrs. Goal AUC 400-550. Expected AUC: 475.3 SCr used: 0.8 (11/27 SCr = 0.68)  Pharmacy will continue to follow and will adjust abx dosing whenever warranted.  Temp (24hrs), Avg:99.9 F (37.7 C), Min:98.3 F (36.8 C), Max:101 F (38.3 C)   Recent Labs  Lab 05/03/23 1502 05/03/23 1504 05/03/23 2356  WBC 16.3*  --   --   CREATININE 0.68  --   --   LATICACIDVEN  --  2.0* 1.3    Estimated Creatinine Clearance: 87.1 mL/min (by C-G formula based on SCr of 0.68 mg/dL).    Allergies  Allergen Reactions   Lisinopril Cough    Antimicrobials this admission: 11/28 Cefepime >>  11/28 Vancomycin >>   Microbiology results: 11/28 BCx: Pending 11/28 UCx: Pending   Thank you for allowing pharmacy to be a part of this patient's care.  Otelia Sergeant, PharmD, [facility] 05/04/2023 4:38 AM

## 2023-05-04 NOTE — ED Notes (Signed)
Informed RN bed assigned 

## 2023-05-04 NOTE — Progress Notes (Signed)
ED Pharmacy Antibiotic Sign Off An antibiotic consult was received from an ED provider for Vancomycin per pharmacy dosing for cellulitis/sepsis. A chart review was completed to assess appropriateness.   The following one time order(s) were placed:  Vancomycin 2000 mg per pt wt: 95.3 kg  Further antibiotic and/or antibiotic pharmacy consults should be ordered by the admitting provider if indicated.   Thank you for allowing pharmacy to be a part of this patient's care.   Thank you, Otelia Sergeant, PharmD, [facility] 05/04/2023 12:13 AM

## 2023-05-04 NOTE — Sepsis Progress Note (Addendum)
Elink monitoring for the code sepsis protocol.   Messaged bedside RN about filing BP's hourly per sepsis protocol.

## 2023-05-04 NOTE — ED Notes (Signed)
ED Provider at bedside - VORB and verified from Dr Shawnee Knapp to give 1 liter of IV LR at a time.
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
## 2023-05-04 NOTE — ED Notes (Signed)
NPO order discontinued by Dr York Cerise and pt given po fluid.
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
## 2023-05-04 NOTE — ED Notes (Signed)
Patients head of bed adjusted. Given fresh cup of ice water

## 2023-05-04 NOTE — Progress Notes (Signed)
CODE SEPSIS - PHARMACY COMMUNICATION  **Broad Spectrum Antibiotics should be administered within 1 hour of Sepsis diagnosis**  Time Code Sepsis Called/Page Received: 0009  Antibiotics Ordered: Ceftriaxone and Vancomycin  Time of 1st antibiotic administration: 0105  Otelia Sergeant, PharmD, [facility] 05/04/2023 12:14 AM
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
## 2023-05-05 DIAGNOSIS — A419 Sepsis, unspecified organism: Secondary | ICD-10-CM | POA: Diagnosis not present

## 2023-05-05 LAB — CBC WITH DIFFERENTIAL/PLATELET
Abs Immature Granulocytes: 0.09 10*3/uL — ABNORMAL HIGH (ref 0.00–0.07)
Basophils Absolute: 0 10*3/uL (ref 0.0–0.1)
Basophils Relative: 0 %
Eosinophils Absolute: 0.3 10*3/uL (ref 0.0–0.5)
Eosinophils Relative: 2 %
HCT: 31.6 % — ABNORMAL LOW (ref 39.0–52.0)
Hemoglobin: 9.5 g/dL — ABNORMAL LOW (ref 13.0–17.0)
Immature Granulocytes: 1 %
Lymphocytes Relative: 9 %
Lymphs Abs: 1.4 10*3/uL (ref 0.7–4.0)
MCH: 22.2 pg — ABNORMAL LOW (ref 26.0–34.0)
MCHC: 30.1 g/dL (ref 30.0–36.0)
MCV: 74 fL — ABNORMAL LOW (ref 80.0–100.0)
Monocytes Absolute: 1.1 10*3/uL — ABNORMAL HIGH (ref 0.1–1.0)
Monocytes Relative: 7 %
Neutro Abs: 12.4 10*3/uL — ABNORMAL HIGH (ref 1.7–7.7)
Neutrophils Relative %: 81 %
Platelets: 403 10*3/uL — ABNORMAL HIGH (ref 150–400)
RBC: 4.27 MIL/uL (ref 4.22–5.81)
RDW: 17.2 % — ABNORMAL HIGH (ref 11.5–15.5)
WBC: 15.3 10*3/uL — ABNORMAL HIGH (ref 4.0–10.5)
nRBC: 0 % (ref 0.0–0.2)

## 2023-05-05 LAB — BASIC METABOLIC PANEL
Anion gap: 9 (ref 5–15)
BUN: 8 mg/dL (ref 8–23)
CO2: 24 mmol/L (ref 22–32)
Calcium: 8.1 mg/dL — ABNORMAL LOW (ref 8.9–10.3)
Chloride: 105 mmol/L (ref 98–111)
Creatinine, Ser: 0.5 mg/dL — ABNORMAL LOW (ref 0.61–1.24)
GFR, Estimated: >60 mL/min (ref >60)
Glucose, Bld: 80 mg/dL (ref 70–99)
Potassium: 3 mmol/L — ABNORMAL LOW (ref 3.5–5.1)
Sodium: 138 mmol/L (ref 135–145)

## 2023-05-05 MED ORDER — ZINC OXIDE 12.8 % EX OINT: TOPICAL_OINTMENT | Freq: Two times a day (BID) | CUTANEOUS | Status: DC

## 2023-05-05 MED ORDER — POTASSIUM CHLORIDE CRYS ER 20 MEQ PO TBCR
40.0000 meq | EXTENDED_RELEASE_TABLET | Freq: Two times a day (BID) | ORAL | Status: AC
  Administered 2023-05-05 (×2): 40 meq via ORAL
  Filled 2023-05-05 (×3): qty 2

## 2023-05-05 MED ORDER — ZINC OXIDE 40 % EX OINT
TOPICAL_OINTMENT | Freq: Two times a day (BID) | CUTANEOUS | Status: DC
  Filled 2023-05-05: qty 113
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
## 2023-05-05 NOTE — TOC CM/SW Note (Signed)
    Durable Medical Equipment  (From admission, onward)           Start     Ordered   05/05/23 1211  For home use only DME Hospital bed  Once       Question Answer Comment  Length of Need Lifetime   The above medical condition requires: Patient requires the ability to reposition frequently   Bed type Semi-electric   Support Surface: Low Air loss Mattress      05/05/23 1210

## 2023-05-05 NOTE — TOC Initial Note (Signed)
Transition of Care [facility]) - Initial/Assessment Note    Patient Details  Name: Bradley [name] MRN: [id number] Date of Birth: Dec 05, 1945  [facility] of [facility]) CM/SW Contact:    Margarito Liner, [name] Phone Number: 05/05/2023, 1:32 PM  Clinical Narrative: CSW met with patient. No supports at bedside. CSW introduced role and inquired about interest in getting an air mattress. Patient confirmed and stated he also needs a hospital bed. CSW ordered through [employer]. Patient stated his son will be the contact person for delivery. Son will also transport him home at discharge. Patient stated that he goes to the wound care clinic and his son also does dressing changes.  Expected Discharge Plan: Home/Self Care Barriers to Discharge: Continued Medical Work up   Patient Goals and CMS Choice     Choice offered to / list presented to : Patient      Expected Discharge Plan and Services     Post Acute Care Choice: Durable Medical Equipment Living arrangements for the past 2 months: Single Family Home                 DME Arranged: Hospital bed, Air overlay mattress DME Agency: [employer] Date DME Agency Contacted: 05/05/23   Representative spoke with at DME Agency: Mickeal Needy            Prior Living Arrangements/Services Living arrangements for the past 2 months: Single Family Home   Patient language and need for interpreter reviewed:: Yes Do you feel safe going back to the place where you live?: Yes      Need for Family Participation in Patient Care: Yes (Comment) Care giver support system in place?: Yes (comment)   Criminal Activity/Legal Involvement Pertinent to Current Situation/Hospitalization: No - Comment as needed  Activities of Daily Living   ADL Screening (condition at time of admission) Independently performs ADLs?: Yes (appropriate for developmental age) Is the patient deaf or have difficulty hearing?: No Does the patient have difficulty seeing, even when wearing  glasses/contacts?: No Does the patient have difficulty concentrating, remembering, or making decisions?: No  Permission Sought/Granted Permission sought to share information with : Facility Industrial/product designer granted to share information with : Yes, Verbal Permission Granted     Permission granted to share info w AGENCY: DME companies        Emotional Assessment Appearance:: Appears stated age Attitude/Demeanor/Rapport: Engaged, Gracious Affect (typically observed): Accepting, Appropriate, Calm, Pleasant Orientation: : Oriented to Self, Oriented to Place, Oriented to  Time, Oriented to Situation Alcohol / Substance Use: Not Applicable Psych Involvement: No (comment)  Admission diagnosis:  Paraplegia (HCC) [G82.20] Hypokalemia [E87.6] Wound infection [T14.8XXA, L08.9] Acute cystitis without hematuria [N30.00] Sacral osteomyelitis (HCC) [M46.28] Chronic osteomyelitis (HCC) [M86.60] Wound of sacral region, initial encounter [S31.000A] Sepsis (HCC) [A41.9] Severe sepsis (HCC) [A41.9, R65.20] Patient Active Problem List   Diagnosis Date Noted   Acute cystitis without hematuria 05/04/2023   Pressure injury of sacral region, stage 4 (HCC) 05/04/2023   Sacral osteomyelitis (HCC) 05/04/2023   Osteomyelitis of pelvic region or thigh, acute, left (HCC) 01/26/2023   HTN (hypertension) 01/26/2023   Hypokalemia 01/26/2023   SIRS (systemic inflammatory response syndrome) (HCC)    Sepsis (HCC) 06/02/2019   Neurogenic bladder 08/21/2018   Recurrent UTI 08/21/2018   Hx of spinal cord injury 08/21/2018   Sacral wound 06/01/2018   Osteomyelitis (HCC) 06/01/2018   Paraplegia (HCC) 06/01/2018   Iron deficiency anemia 04/19/2017   PCP:  Marisue Ivan, MD Pharmacy:  [facility] [address], [place] - [address] [place] [address] Phone: [phone] Fax: [phone]     Social Determinants of Health (SDOH) Social History: SDOH  Screenings   Food Insecurity: No Food Insecurity (05/04/2023)  Recent Concern: Food Insecurity - Food Insecurity Present (02/12/2023)   [facility]  Housing: Low Risk  (05/04/2023)  Transportation Needs: No Transportation Needs (05/04/2023)  Utilities: Not At Risk (05/04/2023)  Depression (PHQ2-9): Low Risk  (03/02/2023)  Financial Resource Strain: Medium Risk (02/12/2023)   Received from [facility]  Tobacco Use: Medium Risk (05/04/2023)   SDOH Interventions:     Readmission Risk Interventions     No data to display

## 2023-05-05 NOTE — Consult Note (Signed)
WOC Nurse Consult Note: patient familiar to [facility] team from previous admissions; has a history of Stage 3 Sacral ulcer since 2016 (is a paraplegic). [facility] and followed by plastics at [facility], had a skin graft by them in 2017 that was not successful per [facility] notes; also has a diverting colostomy to assist with wound healing  Reason for Consult: sacral ulcer and ostomy  Wound type: Stage 3 Sacral/coccyx ulcer and Stage 3 PI L ischium (which he states has only been there for several months) Pressure Injury POA: Yes Measurement:  1.  Sacrum stage 3 PI 9 cm x 8 cm x 4 cm with 2 cm undermining at 8 o'clock 90% pink moist 10% scattered fibrin  2. Left Ischium Stage 3 PI 5 cm x 3 cm x 3 cm 100% pink moist  Wound bed: as above  Drainage (amount, consistency, odor) heavy tan drainage from sacral wound, moderate amount from L ischium (patient is being treated with IV antibiotics)  Periwound: scar tissue, maceration  Dressing procedure/placement/frequency:  Clean Sacral and L ischial wound with Vashe wound cleanser Hart Rochester [phone]), apply Vashe Moistened Kerlix to wound bed twice daily making sure to cover any areas depth and undermining.  Coat surrounding skin with triple paste.  Cover with dry gauze/ABD pad. May secure with tape or silicone foam (wounds are draining too much at this time just to utilize silicone foams).   Patient would benefit from a low air loss mattress for pressure redistribution and moisture management.   POC discussed with patient and bedside nurse.  Advised patient we will do the Vashe dressings twice daily here due to the heavy drainage he is experiencing but that when he returns home he should go back to dressing changes as ordered by Dr. Leonard Schwartz.   WOC Nurse ostomy consult note; longstanding diverting ostomy  Stoma type/location: LLQ colostomy  Stomal assessment/size: 1" pink moist productive of soft brown stool  Peristomal assessment:   intact  Treatment options for stomal/peristomal skin: no sting barrier wipe and 2" barrier ring  Output  approximately 150 mls brown soft stool  Ostomy pouching: 1pc flat flexible Hart Rochester [phone] (WOC RN removed old pouch and replaced at this visit)  Education provided:  none, patient is independent with care of ostomy at home.   Enrolled patient in [employer] DC program: no, established ostomy   Placed (3) of the 1 piece flat flexible at bedside Hart Rochester [phone]) and (3) 2" barrier rings.  Also some blue and white packets of no sting barrier wipe.  Discussed this with patient.   WOC team will not follow. Re-consult if further needs arise.   Thank you,    Priscella Mann MSN, RN-BC, [employer] [phone]

## 2023-05-05 NOTE — Progress Notes (Signed)
OT Cancellation Note  Patient Details Name: LORENZ KERSTETTER MRN: [id number] DOB: June 16, 1945   Cancelled Treatment:    Reason Eval/Treat Not Completed: OT screened, no needs identified, will sign off. Pt reports he is at his baseline in regard to strength, bed mobility, transfers and ADLs. Lives with son who assists with hoyer lift and shower transfers and otherwise pt is PWC bound. Feels no functional decline or need for acute OT services and no OT follow up upon DC. Signed off in house.  Saadia Dewitt E Tuwana Kapaun 05/05/2023, 9:30 AM

## 2023-05-05 NOTE — Plan of Care (Signed)
  Problem: Education: Goal: Knowledge of General Education information will improve Description Including pain rating scale, medication(s)/side effects and non-pharmacologic comfort measures Outcome: Progressing   Problem: Health Behavior/Discharge Planning: Goal: Ability to manage health-related needs will improve Outcome: Progressing   

## 2023-05-06 DIAGNOSIS — A419 Sepsis, unspecified organism: Secondary | ICD-10-CM | POA: Diagnosis not present

## 2023-05-06 LAB — CBC WITH DIFFERENTIAL/PLATELET
Abs Immature Granulocytes: 0.11 10*3/uL — ABNORMAL HIGH (ref 0.00–0.07)
Basophils Absolute: 0 10*3/uL (ref 0.0–0.1)
Basophils Relative: 0 %
Eosinophils Absolute: 0.3 10*3/uL (ref 0.0–0.5)
Eosinophils Relative: 2 %
HCT: 29.1 % — ABNORMAL LOW (ref 39.0–52.0)
Hemoglobin: 9 g/dL — ABNORMAL LOW (ref 13.0–17.0)
Immature Granulocytes: 1 %
Lymphocytes Relative: 15 %
Lymphs Abs: 1.9 10*3/uL (ref 0.7–4.0)
MCH: 22.2 pg — ABNORMAL LOW (ref 26.0–34.0)
MCHC: 30.9 g/dL (ref 30.0–36.0)
MCV: 71.7 fL — ABNORMAL LOW (ref 80.0–100.0)
Monocytes Absolute: 1.4 10*3/uL — ABNORMAL HIGH (ref 0.1–1.0)
Monocytes Relative: 10 %
Neutro Abs: 9.5 10*3/uL — ABNORMAL HIGH (ref 1.7–7.7)
Neutrophils Relative %: 72 %
Platelets: 400 10*3/uL (ref 150–400)
RBC: 4.06 MIL/uL — ABNORMAL LOW (ref 4.22–5.81)
RDW: 17.3 % — ABNORMAL HIGH (ref 11.5–15.5)
WBC: 13.2 10*3/uL — ABNORMAL HIGH (ref 4.0–10.5)
nRBC: 0 % (ref 0.0–0.2)

## 2023-05-06 LAB — BASIC METABOLIC PANEL
Anion gap: 9 (ref 5–15)
BUN: 8 mg/dL (ref 8–23)
CO2: 24 mmol/L (ref 22–32)
Calcium: 8.2 mg/dL — ABNORMAL LOW (ref 8.9–10.3)
Chloride: 102 mmol/L (ref 98–111)
Creatinine, Ser: 0.52 mg/dL — ABNORMAL LOW (ref 0.61–1.24)
GFR, Estimated: >60 mL/min (ref >60)
Glucose, Bld: 82 mg/dL (ref 70–99)
Potassium: 3.7 mmol/L (ref 3.5–5.1)
Sodium: 135 mmol/L (ref 135–145)

## 2023-05-06 LAB — MRSA NEXT GEN BY PCR, NASAL: MRSA by PCR Next Gen: NOT DETECTED

## 2023-05-06 MED ORDER — ALUM & MAG HYDROXIDE-SIMETH 200-200-20 MG/5ML PO SUSP: 30.0000 mL | ORAL | Status: DC | PRN

## 2023-05-06 MED ORDER — SIMETHICONE 80 MG PO CHEW
80.0000 mg | CHEWABLE_TABLET | Freq: Four times a day (QID) | ORAL | Status: DC
  Administered 2023-05-06 – 2023-05-08 (×9): 80 mg via ORAL
  Filled 2023-05-06 (×9): qty 1

## 2023-05-06 NOTE — TOC Progression Note (Signed)
Transition of Care [facility]) - Progression Note    Patient Details  Name: Bradley [name] MRN: [id number] Date of Birth: 1946-04-29  Transition of Care [facility]) CM/SW Contact  Liliana Cline, [name] Phone Number: 05/06/2023, 11:18 AM  Clinical Narrative:    Patient requests Aide added to his [facility] services and an additional RN visit each week. Called Cory with [facility], he states they will do a reassessment when patient gets home and if additional RN visits are needed, they can add this. Kandee Keen confirmed they can add Aide services now.    Expected Discharge Plan: Home/Self Care Barriers to Discharge: Continued Medical Work up  Expected Discharge Plan and Services     Post Acute Care Choice: Durable Medical Equipment Living arrangements for the past 2 months: Single Family Home                 DME Arranged: Hospital bed, Air overlay mattress DME Agency: [employer] Date DME Agency Contacted: 05/05/23   Representative spoke with at DME Agency: Mickeal Needy             Social Determinants of Health (SDOH) Interventions SDOH Screenings   Food Insecurity: No Food Insecurity (05/04/2023)  Recent Concern: Food Insecurity - Food Insecurity Present (02/12/2023)   Received [facility]  Housing: Low Risk  (05/04/2023)  Transportation Needs: No Transportation Needs (05/04/2023)  Utilities: Not At Risk (05/04/2023)  Depression (PHQ2-9): Low Risk  (03/02/2023)  Financial Resource Strain: Medium Risk (02/12/2023)   Received from [facility]  Tobacco Use: Medium Risk (05/04/2023)    Readmission Risk Interventions     No data to display
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
## 2023-05-06 NOTE — Plan of Care (Signed)
  Problem: Education: Goal: Knowledge of General Education information will improve Description: Including pain rating scale, medication(s)/side effects and non-pharmacologic comfort measures Outcome: Progressing   Problem: Health Behavior/Discharge Planning: Goal: Ability to manage health-related needs will improve Outcome: Progressing   Problem: Nutrition: Goal: Adequate nutrition will be maintained Outcome: Progressing   Problem: Elimination: Goal: Will not experience complications related to bowel motility Outcome: Progressing Goal: Will not experience complications related to urinary retention Outcome: Progressing   Problem: Pain Management: Goal: General experience of comfort will improve Outcome: Progressing   Problem: Safety: Goal: Ability to remain free from injury will improve Outcome: Progressing

## 2023-05-06 NOTE — Plan of Care (Signed)
  Problem: Education: Goal: Knowledge of General Education information will improve Description Including pain rating scale, medication(s)/side effects and non-pharmacologic comfort measures Outcome: Progressing   Problem: Health Behavior/Discharge Planning: Goal: Ability to manage health-related needs will improve Outcome: Progressing   

## 2023-05-07 DIAGNOSIS — A419 Sepsis, unspecified organism: Secondary | ICD-10-CM | POA: Diagnosis not present

## 2023-05-07 LAB — CBC WITH DIFFERENTIAL/PLATELET
Abs Immature Granulocytes: 0.07 10*3/uL (ref 0.00–0.07)
Basophils Absolute: 0 10*3/uL (ref 0.0–0.1)
Basophils Relative: 0 %
Eosinophils Absolute: 0.3 10*3/uL (ref 0.0–0.5)
Eosinophils Relative: 2 %
HCT: 29 % — ABNORMAL LOW (ref 39.0–52.0)
Hemoglobin: 9 g/dL — ABNORMAL LOW (ref 13.0–17.0)
Immature Granulocytes: 1 %
Lymphocytes Relative: 18 %
Lymphs Abs: 2.1 10*3/uL (ref 0.7–4.0)
MCH: 22.1 pg — ABNORMAL LOW (ref 26.0–34.0)
MCHC: 31 g/dL (ref 30.0–36.0)
MCV: 71.3 fL — ABNORMAL LOW (ref 80.0–100.0)
Monocytes Absolute: 1.3 10*3/uL — ABNORMAL HIGH (ref 0.1–1.0)
Monocytes Relative: 11 %
Neutro Abs: 8 10*3/uL — ABNORMAL HIGH (ref 1.7–7.7)
Neutrophils Relative %: 68 %
Platelets: 397 10*3/uL (ref 150–400)
RBC: 4.07 MIL/uL — ABNORMAL LOW (ref 4.22–5.81)
RDW: 17.5 % — ABNORMAL HIGH (ref 11.5–15.5)
WBC: 11.8 10*3/uL — ABNORMAL HIGH (ref 4.0–10.5)
nRBC: 0 % (ref 0.0–0.2)

## 2023-05-07 LAB — URINE CULTURE

## 2023-05-07 LAB — BASIC METABOLIC PANEL
Anion gap: 9 (ref 5–15)
BUN: 8 mg/dL (ref 8–23)
CO2: 23 mmol/L (ref 22–32)
Calcium: 8.3 mg/dL — ABNORMAL LOW (ref 8.9–10.3)
Chloride: 103 mmol/L (ref 98–111)
Creatinine, Ser: 0.52 mg/dL — ABNORMAL LOW (ref 0.61–1.24)
GFR, Estimated: >60 mL/min (ref >60)
Glucose, Bld: 83 mg/dL (ref 70–99)
Potassium: 3.2 mmol/L — ABNORMAL LOW (ref 3.5–5.1)
Sodium: 135 mmol/L (ref 135–145)

## 2023-05-07 LAB — GLUCOSE, CAPILLARY
Glucose-Capillary: 84 mg/dL (ref 70–99)
Glucose-Capillary: 101 mg/dL — ABNORMAL HIGH (ref 70–99)

## 2023-05-07 MED ORDER — POTASSIUM CHLORIDE CRYS ER 20 MEQ PO TBCR
40.0000 meq | EXTENDED_RELEASE_TABLET | Freq: Once | ORAL | Status: AC
  Administered 2023-05-07: 40 meq via ORAL
  Filled 2023-05-07: qty 2
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
## 2023-05-07 NOTE — TOC Progression Note (Signed)
Transition of Care [facility]) - Progression Note    Patient Details  Name: Bradley [name] MRN: [id number] Date of Birth: 05-16-1946  Transition of Care [facility]) CM/SW Contact  Liliana Cline, [name] Phone Number: 05/07/2023, 11:41 AM  Clinical Narrative:    Notified by MD of expected DC tomorrow. Called Kim with [employer] who states the hospital bed is scheduled to be delivered to the home today 12/1.   Expected Discharge Plan: Home/Self Care Barriers to Discharge: Continued Medical Work up  Expected Discharge Plan and Services     Post Acute Care Choice: Durable Medical Equipment Living arrangements for the past 2 months: Single Family Home                 DME Arranged: Hospital bed, Air overlay mattress DME Agency: [employer] Date DME Agency Contacted: 05/05/23   Representative spoke with at DME Agency: Mickeal Needy             Social Determinants of Health (SDOH) Interventions SDOH Screenings   Food Insecurity: No Food Insecurity (05/04/2023)  Recent Concern: Food Insecurity - Food Insecurity Present (02/12/2023)   [facility]  Housing: Low Risk  (05/04/2023)  Transportation Needs: No Transportation Needs (05/04/2023)  Utilities: Not At Risk (05/04/2023)  Depression (PHQ2-9): Low Risk  (03/02/2023)  Financial Resource Strain: Medium Risk (02/12/2023)   Received from [facility]  Tobacco Use: Medium Risk (05/04/2023)    Readmission Risk Interventions     No data to display

## 2023-05-08 DIAGNOSIS — A419 Sepsis, unspecified organism: Secondary | ICD-10-CM | POA: Diagnosis not present

## 2023-05-08 LAB — GLUCOSE, CAPILLARY: Glucose-Capillary: 79 mg/dL (ref 70–99)

## 2023-05-08 MED ORDER — SIMETHICONE 80 MG PO CHEW: 120.0000 mg | CHEWABLE_TABLET | Freq: Four times a day (QID) | ORAL | 0 refills | Status: DC

## 2023-05-08 MED ORDER — AMOXICILLIN-POT CLAVULANATE 875-125 MG PO TABS: 1.0000 | ORAL_TABLET | Freq: Two times a day (BID) | ORAL | 0 refills | Status: AC

## 2023-05-08 MED ORDER — SIMETHICONE 80 MG PO CHEW: 120.0000 mg | CHEWABLE_TABLET | Freq: Four times a day (QID) | ORAL | Status: DC

## 2023-05-08 MED ORDER — AMOXICILLIN-POT CLAVULANATE 875-125 MG PO TABS
1.0000 | ORAL_TABLET | Freq: Two times a day (BID) | ORAL | Status: DC
  Administered 2023-05-08: 1 via ORAL
  Filled 2023-05-08: qty 1

## 2023-05-08 MED ORDER — OXYCODONE HCL 5 MG PO TABS: 5.0000 mg | ORAL_TABLET | ORAL | 0 refills | Status: DC | PRN

## 2023-05-08 NOTE — TOC Progression Note (Addendum)
Transition of Care [facility]) - Progression Note    Patient Details  Name: Bradley [name] MRN: [id number] Date of Birth: 04-01-1946  Transition of Care [facility]) CM/SW Contact  Liliana Cline, [name] Phone Number: 05/08/2023, 9:41 AM  Clinical Narrative:    Patient told staff the hospital bed is not being delivered until Wednesday. CSW called Cletis Athens with [employer] to check if bed was delivered yesterday as previously reported.  10:10- Per Cletis Athens with [employer], their processing team reported the bed has not been delivered yet. They are checking if it can be delivered today. They are aware this is what is needed in order for the patient to discharge.  10:30- Notified by RN that patient states they are waiting for the provider of his previous hospital bed to pick that bed up before the new one fro [employer] can be delivered. The company is [employer]. Patient awaiting call back to see if they can pick up the bed today. Patient states there is not room for both beds at the home and that they cannot move the bed as it is a special bed with buckets of sand and he does not want it to be broken and be charged for it. Updated Jon with [employer].   Expected Discharge Plan: Home/Self Care Barriers to Discharge: Continued Medical Work up  Expected Discharge Plan and Services     Post Acute Care Choice: Durable Medical Equipment Living arrangements for the past 2 months: Single Family Home                 DME Arranged: Hospital bed, Air overlay mattress DME Agency: [employer] Date DME Agency Contacted: 05/05/23   Representative spoke with at DME Agency: Mickeal Needy             Social Determinants of Health (SDOH) Interventions SDOH Screenings   Food Insecurity: No Food Insecurity (05/04/2023)  Recent Concern: Food Insecurity - Food Insecurity Present (02/12/2023)   Received from [facility]  Housing: Low Risk  (05/04/2023)  Transportation Needs: No Transportation Needs (05/04/2023)   Utilities: Not At Risk (05/04/2023)  Depression (PHQ2-9): Low Risk  (03/02/2023)  Financial Resource Strain: Medium Risk (02/12/2023)   Received from [facility]  Tobacco Use: Medium Risk (05/04/2023)    Readmission Risk Interventions     No data to display

## 2023-05-08 NOTE — Consult Note (Signed)
[facility] Liaison Note  05/08/2023  JACODY CAPITO 1945/08/29 [id number]  Location: RN Hospital Liaison screened the patient remotely at [facility].  Insurance: [employer] HMO   FLORIN FEFFER is a 77 y.o. male who is a Primary Care Patient of Marisue Ivan, MD [facility]).The patient was screened for  readmission hospitalization with noted medium risk score for unplanned readmission risk with 2 IP in 6 months.  The patient was assessed for potential Care Management service needs for post hospital transition for care coordination. Review of patient's electronic medical record reveals patient was admitted with Sepsis. Pt will discharged home with [facility] and aide services. PCP office will completed there TOC follow up. No anticipated needs via [facility].    [facility] Care Management/Population Health does not replace or interfere with any arrangements made by the Inpatient Transition of Care team.   For questions contact:    Elliot Cousin, RN, [facility] Liaison [place]   [facility], Population Health Office Hours MTWF  8:00 am-6:00 pm Direct Dial: [phone] mobile [phone] [Office toll free line] Office Hours are M-F 8:30 - 5 pm [email or web address]
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
## 2023-05-08 NOTE — TOC Transition Note (Signed)
Transition of Care [facility]) - CM/SW Discharge Note   Patient Details  Name: Bradley [name] MRN: [id number] Date of Birth: [birth year]  Transition of Care [facility]) CM/SW Contact:  Liliana Cline, [name] Phone Number: 05/08/2023, 11:45 AM   Clinical Narrative:    Patient has spoken to [employer] (previous hospital bed supplier) and they will pick up the old hospital bed on Friday. Patient would like to return home today, and have [employer] deliver the new hospital bed on Friday once the old bed is picked up. CSW updated Cletis Athens with [employer] who states they can do this. Patient to keep the [employer] office [facility]) updated at [phone] on when [employer] is picking up the old bed.  CSW notified Cory with [facility] [place] home today - they will follow for RN and Aide.   Final next level of care: Home w Home Health Services Barriers to Discharge: Barriers Resolved   Patient Goals and CMS Choice   Choice offered to / list presented to : Patient  Discharge Placement                         Discharge Plan and Services Additional resources added to the After Visit Summary for       Post Acute Care Choice: Durable Medical Equipment          DME Arranged: Hospital bed DME Agency: [employer] Date DME Agency Contacted: 05/08/23   Representative spoke with at DME Agency: Cletis Athens HH Arranged: RN, Nurse's Aide HH Agency: [facility] Date [facility] Agency Contacted: 05/08/23   Representative spoke with at [facility] Agency: Kandee Keen  Social Determinants of Health (SDOH) Interventions SDOH Screenings   Food Insecurity: No Food Insecurity (05/04/2023)  Recent Concern: Food Insecurity - Food Insecurity Present (02/12/2023)   Received from [facility]  Housing: Low Risk  (05/04/2023)  Transportation Needs: No Transportation Needs (05/04/2023)  Utilities: Not At Risk (05/04/2023)  Depression (PHQ2-9): Low Risk  (03/02/2023)  Financial Resource Strain:  Medium Risk (02/12/2023)   Received from [facility]  Tobacco Use: Medium Risk (05/04/2023)     Readmission Risk Interventions     No data to display

## 2023-05-08 NOTE — Care Management Important Message (Signed)
Important Message  Patient Details  Name: Bradley [name] MRN: [id number] Date of Birth: 26-May-1946   Important Message Given:  Yes - Medicare IM     Verita Schneiders Ardyce Heyer 05/08/2023, 2:35 PM

## 2023-05-09 LAB — CULTURE, BLOOD (ROUTINE X 2)
Culture: NO GROWTH
Culture: NO GROWTH
Special Requests: ADEQUATE

## 2023-05-12 DIAGNOSIS — Z8739 Personal history of other diseases of the musculoskeletal system and connective tissue: Secondary | ICD-10-CM | POA: Diagnosis not present

## 2023-05-12 DIAGNOSIS — G8220 Paraplegia, unspecified: Secondary | ICD-10-CM | POA: Diagnosis not present

## 2023-05-12 DIAGNOSIS — Z933 Colostomy status: Secondary | ICD-10-CM | POA: Diagnosis not present

## 2023-05-12 DIAGNOSIS — Z09 Encounter for follow-up examination after completed treatment for conditions other than malignant neoplasm: Secondary | ICD-10-CM | POA: Diagnosis not present

## 2023-05-12 DIAGNOSIS — K5900 Constipation, unspecified: Secondary | ICD-10-CM | POA: Diagnosis not present

## 2023-05-18 ENCOUNTER — Encounter: Payer: Medicare HMO | Attending: Physician Assistant | Admitting: Physician Assistant

## 2023-05-18 DIAGNOSIS — G8220 Paraplegia, unspecified: Secondary | ICD-10-CM | POA: Diagnosis not present

## 2023-05-18 DIAGNOSIS — M069 Rheumatoid arthritis, unspecified: Secondary | ICD-10-CM | POA: Insufficient documentation

## 2023-05-18 DIAGNOSIS — I10 Essential (primary) hypertension: Secondary | ICD-10-CM | POA: Diagnosis not present

## 2023-05-18 DIAGNOSIS — L89154 Pressure ulcer of sacral region, stage 4: Secondary | ICD-10-CM | POA: Insufficient documentation

## 2023-05-18 DIAGNOSIS — M8668 Other chronic osteomyelitis, other site: Secondary | ICD-10-CM | POA: Insufficient documentation

## 2023-05-18 DIAGNOSIS — D649 Anemia, unspecified: Secondary | ICD-10-CM | POA: Diagnosis not present

## 2023-05-18 DIAGNOSIS — L89894 Pressure ulcer of other site, stage 4: Secondary | ICD-10-CM | POA: Diagnosis not present

## 2023-05-18 NOTE — Progress Notes (Addendum)
Bradley [name] ([id number]) [id number] Page 1 of 8 Visit Report for 05/18/2023 Chief Complaint Document Details Patient Name: Date of Service: Bradley [name] MES A. 05/18/2023 2:45 PM Medical Record Number: [id number] Patient Account Number: [id number] Date of Birth/Sex: Treating RN: 04-05-1946 (77 y.o. Bradley [name] Primary Care Provider: Marisue Ivan Other Clinician: Betha Loa Referring Provider: Treating Provider/Extender: Maxwell Caul Weeks in Treatment: 200 Information Obtained from: Patient Chief Complaint sacral ulcer, left posterior leg/thigh wound Electronic Signature(s) Signed: 05/18/2023 2:45:04 PM By: Allen Derry PA-C Entered By: Allen Derry on 05/18/2023 14:45:04 -------------------------------------------------------------------------------- Debridement Details Patient Name: Date of Service: Bradley [name] MES A. 05/18/2023 2:45 PM Medical Record Number: [id number] Patient Account Number: [id number] Date of Birth/Sex: Treating RN: [birth year] (77 y.o. Bradley [name] Primary Care Provider: Marisue Ivan Other Clinician: Betha Loa Referring Provider: Treating Provider/Extender: Maxwell Caul Weeks in Treatment: 200 Debridement Performed for Assessment: Wound #14 Left,Posterior Upper Leg Performed By: Physician Allen Derry, PA-C Debridement Type: Debridement Level of Consciousness (Pre-procedure): Awake and Alert Pre-procedure Verification/Time Out Yes - 15:34 Taken: Start Time: 15:34 Percent of Wound Bed Debrided: 25% T Area Debrided (cm): otal 23.55 Tissue and other material debrided: Non-Viable, Bone, Slough, Slough Level: Skin/Subcutaneous Tissue/Muscle/Bone Debridement Description: Excisional Instrument: Curette Specimen: Tissue Culture Number of Specimens T aken: 1 Bleeding: Moderate Hemostasis Achieved: Pressure Response to Treatment: Procedure was tolerated  well Level of Consciousness ([address]ANTHONE, VAUTOUR A ([id number]) [id number] Page 2 of 8 Level of Consciousness (Post- Awake and Alert procedure): Post Debridement Measurements of Total Wound Length: (cm) 12 Stage: Category/Stage IV Width: (cm) 10 Depth: (cm) 4 Volume: (cm) 376.991 Character of Wound/Ulcer Post Debridement: Stable Post Procedure Diagnosis Same as Pre-procedure Electronic Signature(s) Signed: 05/19/2023 12:18:53 PM By: Angelina Pih Signed: 05/19/2023 12:55:12 PM By: Allen Derry PA-C Signed: 05/22/2023 2:04:51 PM By: Betha Loa Entered By: Betha Loa on 05/18/2023 17:26:11 -------------------------------------------------------------------------------- HPI Details Patient Name: Date of Service: Bradley [name] MES A. 05/18/2023 2:45 PM Medical Record Number: [id number] Patient Account Number: [id number] Date of Birth/Sex: Treating RN: [birth year] (77 y.o. Bradley [name] Primary Care Provider: Marisue Ivan Other Clinician: Betha Loa Referring Provider: Treating Provider/Extender: Maxwell Caul Weeks in Treatment: 200 History of Present Illness HPI Description: 05-18-2023 upon evaluation patient continues to feel very poorly at this point. Unfortunately he has a wound that is actually worse than the last time I saw him at that point I sent him into the hospital for further evaluation and treatment this is actually worse now than it was then. There is more bone exposed the bone is broken down more and this is on the left gluteal location. I am actually going to obtain cultures and samples as is not on any antibiotics right now of the bone in order to evaluate for pathology and culture and sensitivities. The patient is in agreement with the plan. I am actually going to be recommending that he go to the hospital but at the same time I wanted to make sure that we still got him going with the cultures that  evaluation at this point as I do not want this to be missed before he is put on antibiotics. With that being said the patient tells me that he is feeling chills he does not feel well at all and he actually seems to be doing worse right now that he was the last time I saw him in my opinion. Electronic Signature(s) Signed: 05/18/2023 4:05:19 PM By: Allen Derry  PA-C Entered By: Allen Derry on 05/18/2023 16:05:19 -------------------------------------------------------------------------------- Physical Exam Details Patient Name: Date of Service: Bradley [name] MES A. 05/18/2023 2:45 PM Medical Record Number: [id number] Patient Account Number: [id number] PRESIDENT, SCURTI ([id number]) [phone].pdf Page 3 of 8 Date of Birth/Sex: Treating RN: 1946/01/01 (77 y.o. Bradley [name] Primary Care Provider: Other Clinician: Doylene Canard Referring Provider: Treating Provider/Extender: Maxwell Caul Weeks in Treatment: 200 Constitutional Well-nourished and well-hydrated in no acute distress. Respiratory normal breathing without difficulty. Psychiatric this patient is able to make decisions and demonstrates good insight into disease process. Alert and Oriented x 3. pleasant and cooperative. Notes Upon inspection patient's wound bed actually showed signs of significant deterioration on the left posterior upper leg location where he had significant breakdown here. I actually did obtain 2 samples 1 for pathology and 1 for culture today in order to evaluate for again the bacteria and the amount of breakdown here. Again I am 99.9% sure this is osteomyelitis without question although this will help to confirm and identify what organisms we may be dealing with. The patient is in agreement with that plan. Subsequently we Are going to see where things stand at follow-up although I am going to be having him go to the ER for evaluation ASAP. Electronic  Signature(s) Signed: 05/18/2023 4:06:35 PM By: Allen Derry PA-C Previous Signature: 05/18/2023 4:06:04 PM Version By: Allen Derry PA-C Entered By: Allen Derry on 05/18/2023 16:06:35 -------------------------------------------------------------------------------- Physician Orders Details Patient Name: Date of Service: Bradley [name] MES A. 05/18/2023 2:45 PM Medical Record Number: [id number] Patient Account Number: [id number] Date of Birth/Sex: Treating RN: February 17, 1946 (77 y.o. Bradley [name] Primary Care Provider: Marisue Ivan Other Clinician: Betha Loa Referring Provider: Treating Provider/Extender: Maxwell Caul Weeks in Treatment: 200 The following information was scribed by: Betha Loa The information was scribed for: Allen Derry Verbal / Phone Orders: No Diagnosis Coding ICD-10 Coding Code Description L89.154 Pressure ulcer of sacral region, stage 4 L89.894 Pressure ulcer of other site, stage 4 M86.68 Other chronic osteomyelitis, other site G82.20 Paraplegia, unspecified Follow-up Appointments Return Appointment in 2 weeks. Home Health Home Health Company: - [facility] for wound care. May utilize formulary equivalent dressing for wound treatment orders unless otherwise specified. Home Health Nurse may visit PRN to address patients wound care needs. Frances Furbish [phone] **Please direct any NON-WOUND related issues/requests for orders to patient's Primary Care Physician. **If current dressing causes regression in wound condition, may D/C ordered dressing product/s and apply Normal Saline Moist Dressing daily until next Wound Healing Center or Other MD appointment. **Notify Wound Healing Center of regression in wound condition at [phone]. Other Home Health Orders/Instructions: Bathing/ Shower/ Hygiene May shower; gently cleanse wound with antibacterial soap, rinse and pat dry prior to dressing wounds KARLON, RUMBLEY  ([id number]) [id number] Page 4 of 8 Off-Loading Roho cushion for wheelchair Low air-loss mattress (Group 2) Turn and reposition every 2 hours Additional Orders / Instructions Go to Emergency Department of your choice for evaluation and treatment. - patient to go to [facility] ED tomorrow per patients request Wound Treatment Wound #10 - Sacrum Wound Laterality: Midline Cleanser: Dakin 16 (oz) 0.25 1 x Per Day/30 Days Discharge Instructions: Use as directed. Prim Dressing: Gauze (Generic) 1 x Per Day/30 Days ary Discharge Instructions: Moisten 2-3 gauze with Dakins and gently pack into wound Secondary Dressing: Zetuvit Plus 4x8 (in/in) 1 x Per Day/30 Days Secured With: Medipore T - 1M Medipore H Soft  Cloth Surgical T ape ape, 2x2 (in/yd) (Generic) 1 x Per Day/30 Days Wound #14 - Upper Leg Wound Laterality: Left, Posterior Cleanser: Dakin 16 (oz) 0.25 1 x Per Day/30 Days Discharge Instructions: Use as directed. Prim Dressing: Gauze (Generic) 1 x Per Day/30 Days ary Discharge Instructions: Moisten 2-3 gauze with Dakins and gently pack into wound and tunnel Secondary Dressing: Zetuvit Plus 4x4 (in/in) 1 x Per Day/30 Days Secured With: Medipore T - 57M Medipore H Soft Cloth Surgical T ape ape, 2x2 (in/yd) (Generic) 1 x Per Day/30 Days Wound #16 - Groin Cleanser: Dakin 16 (oz) 0.25 1 x Per Day/30 Days Discharge Instructions: Use as directed. Prim Dressing: Gauze (Generic) 1 x Per Day/30 Days ary Discharge Instructions: Moisten 2-3 gauze with Dakins and gently pack into wound and tunnel Secondary Dressing: Zetuvit Plus 4x4 (in/in) 1 x Per Day/30 Days Secured With: Medipore T - 57M Medipore H Soft Cloth Surgical T ape ape, 2x2 (in/yd) (Generic) 1 x Per Day/30 Days Laboratory Bacteria identified in Tissue by Biopsy culture (MICRO) - bone biopsy ,Left upper leg [facility] LOINC Code: 47829-5 Convenience Name: Biopsy specimen culture Bacteria identified in Wound by  Culture (MICRO) - Tissue culture Lt upper leg [facility] LOINC Code: 6462-6 Convenience Name: Wound culture routine Electronic Signature(s) Signed: 05/18/2023 4:38:12 PM By: Allen Derry PA-C Signed: 05/22/2023 2:04:51 PM By: Betha Loa Entered By: Betha Loa on 05/18/2023 16:07:24 [name] ([id number]) [id number] Page 5 of 8 -------------------------------------------------------------------------------- Problem List Details Patient Name: Date of Service: Bradley [name] MES A. 05/18/2023 2:45 PM Medical Record Number: [id number] Patient Account Number: [id number] Date of Birth/Sex: Treating RN: 14-Jan-1946 (77 y.o. 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 05/18/2023 2:45 PM Medical Record Number: [id number] Patient Account Number: [id number] Date of Birth/Sex: Treating  RN: [birth year] (77 y.o. Bradley [name] Primary Care Provider: Marisue Ivan Other Clinician: Betha Loa Referring Provider: Treating Provider/Extender: Rebecka Apley in Treatment: [address] RIGLEY, FALGOUT ([id number]) [id number] Page 6 of 8 Subjective Chief Complaint Information obtained from Patient sacral ulcer, left posterior leg/thigh wound History of Present Illness (HPI) 05-18-2023 upon evaluation patient continues to feel very poorly at this point. Unfortunately he has a wound that is actually worse than the last time I saw him at that point I sent him into the hospital for further evaluation and treatment this is actually worse now than it was then. There is more bone exposed the bone is broken down more and this is on the left gluteal location. I am actually going to obtain cultures and samples as is not on any antibiotics right now of the bone in order to evaluate for pathology and culture and  sensitivities. The patient is in agreement with the plan. I am actually going to be recommending that he go to the hospital but at the same time I wanted to make sure that we still got him going with the cultures that evaluation at this point as I do not want this to be missed before he is put on antibiotics. With that being said the patient tells me that he is feeling chills he does not feel well at all and he actually seems to be doing worse right now that he was the last time I saw him in my opinion. Objective Constitutional Well-nourished and well-hydrated in no acute distress. Vitals Time Taken: 3:02 PM, Temperature: 98.2 F, Pulse: 99 bpm, Respiratory Rate: 18 breaths/min, Blood Pressure: 113/68 mmHg. Respiratory normal breathing without difficulty. Psychiatric this patient is able to make decisions and demonstrates good insight into disease process. Alert and Oriented x 3. pleasant and cooperative. General Notes: Upon  inspection patient's wound bed actually showed signs of significant deterioration on the left posterior upper leg location where he had significant breakdown here. I actually did obtain 2 samples 1 for pathology and 1 for culture today in order to evaluate for again the bacteria and the amount of breakdown here. Again I am 99.9% sure this is osteomyelitis without question although this will help to confirm and identify what organisms we may be dealing with. The patient is in agreement with that plan. Subsequently we Are going to see where things stand at follow-up although I am going to be having him go to the ER for evaluation ASAP. Integumentary (Hair, Skin) Wound #10 status is Open. Original cause of wound was Pressure Injury. The date acquired was: 06/07/2015. The wound has been in treatment 200 weeks. The wound is located on the Midline Sacrum. The wound measures 11.1cm length x 5cm width x 4cm depth; 43.59cm^2 area and 174.358cm^3 volume. There is muscle and Fat Layer (Subcutaneous Tissue) exposed. There is a medium amount of serosanguineous drainage noted. The wound margin is epibole. There is large (67-100%) red, [name] granulation within the wound bed. There is a small (1-33%) amount of necrotic tissue within the wound bed including Adherent Slough. Wound #14 status is Open. Original cause of wound was Shear/Friction. The date acquired was: 11/05/2022. The wound has been in treatment 26 weeks. The wound is located on the Left,Posterior Upper Leg. The wound measures 12cm length x 10cm width x 4cm depth; 94.248cm^2 area and 376.991cm^3 volume. There is bone and Fat Layer (Subcutaneous Tissue) exposed. There is tunneling at 12:00 with a maximum distance of 8.5cm. There is a medium amount of serous drainage noted. The wound margin is thickened. There is large (67-100%) [name] granulation within the wound bed. There is no necrotic tissue within the wound bed. Wound #16 status is Open. Original cause of  wound was Shear/Friction. The date acquired was: 05/18/2023. The wound is located on the Groin. The wound measures 20cm length x 10cm width x 0.1cm depth; 157.08cm^2 area and 15.708cm^3 volume. There is a medium amount of serosanguineous drainage noted. There is no granulation within the wound bed. There is no necrotic tissue within the wound bed. Assessment Active Problems ICD-10 Pressure ulcer of sacral region, stage 4 Pressure ulcer of other site, stage 4 Other chronic osteomyelitis, other site Paraplegia, unspecified Plan Follow-up Appointments: Return Appointment in 2 weeks. Home Health: Home Health Company: - [facility] for wound care. May utilize formulary equivalent dressing for wound treatment orders unless otherwise  specified. Home Health Nurse may visit PRN to address patients wound care needs. Frances Furbish [phone] FARMER, KUNDINGER ([id number]) [id number] Page 7 of 8 **Please direct any NON-WOUND related issues/requests for orders to patient's Primary Care Physician. **If current dressing causes regression in wound condition, may D/C ordered dressing product/s and apply Normal Saline Moist Dressing daily until next Wound Healing Center or Other MD appointment. **Notify Wound Healing Center of regression in wound condition at [phone]. Other Home Health Orders/Instructions: Bathing/ Shower/ Hygiene: May shower; gently cleanse wound with antibacterial soap, rinse and pat dry prior to dressing wounds Off-Loading: Roho cushion for wheelchair Low air-loss mattress (Group 2) Turn and reposition every 2 hours Additional Orders / Instructions: Go to Emergency Department of your choice for evaluation and treatment. - patient to go to [facility] ED tomorrow per patients request WOUND #10: - Sacrum Wound Laterality: Midline Cleanser: Dakin 16 (oz) 0.25 1 x Per Day/30 Days Discharge Instructions: Use as directed. Prim Dressing: Gauze  (Generic) 1 x Per Day/30 Days ary Discharge Instructions: Moisten 2-3 gauze with Dakins and gently pack into wound Secondary Dressing: Zetuvit Plus 4x8 (in/in) 1 x Per Day/30 Days Secured With: Medipore T - 44M Medipore H Soft Cloth Surgical T ape ape, 2x2 (in/yd) (Generic) 1 x Per Day/30 Days WOUND #14: - Upper Leg Wound Laterality: Left, Posterior Cleanser: Dakin 16 (oz) 0.25 1 x Per Day/30 Days Discharge Instructions: Use as directed. Prim Dressing: Gauze (Generic) 1 x Per Day/30 Days ary Discharge Instructions: Moisten 2-3 gauze with Dakins and gently pack into wound and tunnel Secondary Dressing: Zetuvit Plus 4x4 (in/in) 1 x Per Day/30 Days Secured With: Medipore T - 44M Medipore H Soft Cloth Surgical T ape ape, 2x2 (in/yd) (Generic) 1 x Per Day/30 Days WOUND #16: - Groin Wound Laterality: Cleanser: Dakin 16 (oz) 0.25 1 x Per Day/30 Days Discharge Instructions: Use as directed. Prim Dressing: Gauze (Generic) 1 x Per Day/30 Days ary Discharge Instructions: Moisten 2-3 gauze with Dakins and gently pack into wound and tunnel Secondary Dressing: Zetuvit Plus 4x4 (in/in) 1 x Per Day/30 Days Secured With: Medipore T - 44M Medipore H Soft Cloth Surgical T ape ape, 2x2 (in/yd) (Generic) 1 x Per Day/30 Days 1. Based on what I am seeing currently I think the patient has acute osteomyelitis and I think this is actually worse than the last time I saw him which was on May 03, 2023 roughly 2 weeks ago. He actually has more bony breakdown and again I did obtain a bone culture as well as bone pathology to send although I am almost without a doubt that this is osteomyelitis. 2. I am going to recommend as well that the patient should continue to utilize the Dakin's moistened gauze dressing for packing at this point. 3. I did recommend that he should go to the ER ASAP tells me that he is gena go to [facility] tomorrow he has a way that he can get there then and I think that this is gena be the way to go  that was what he was looking for anyway was [facility] versus she is going across the street and I am okay with that he is hoping that they may be able to put him into a facility to help him to heal and again I think this can be something is absolutely necessary. 4. If the patient develops any fevers, chills, nausea, vomiting, or diarrhea sooner he needs to get to the ER ASAP otherwise he will be  going tomorrow to [facility]. We will see patient back for reevaluation in 2 weeks here in the clinic. If anything worsens or changes patient will contact our office for additional recommendations. Electronic Signature(s) Signed: 05/18/2023 4:07:58 PM By: Allen Derry PA-C Entered By: Allen Derry on 05/18/2023 16:07:57 -------------------------------------------------------------------------------- SuperBill Details Patient Name: Date of Service: Bradley [name] MES A. 05/18/2023 Medical Record Number: [id number] Patient Account Number: [id number] Date of Birth/Sex: Treating RN: 1946-05-18 (77 y.o. Bradley [name] Primary Care Provider: Marisue Ivan Other Clinician: Betha Loa Referring Provider: Treating Provider/Extender: Maxwell Caul Weeks in Treatment: 200 Diagnosis Coding ICD-10 Codes Code Description L89.154 Pressure ulcer of sacral region, stage 4 L89.894 Pressure ulcer of other site, stage 4 TROOPER, LANGREHR A ([id number]) [id number] Page 8 of 8 M86.68 Other chronic osteomyelitis, other site G82.20 Paraplegia, unspecified Facility Procedures : CPT4 Code: [id number] Description: 11044 - DEB BONE 20 SQ CM/< ICD-10 Diagnosis Description L89.894 Pressure ulcer of other site, stage 4 Modifier: Quantity: 1 : CPT4 Code: 44034742 Description: 11047 - DEB BONE EA ADDL 20 SQ CM/< ICD-10 Diagnosis Description L89.894 Pressure ulcer of other site, stage 4 Modifier: Quantity: 1 Physician Procedures : CPT4: Description Modifier Code [id number] - WC PHYS  LEVEL 5 - EST PT ICD-10 Diagnosis Description L89.154 Pressure ulcer of sacral region, stage 4 L89.894 Pressure ulcer of other site, stage 4 M86.68 Other chronic osteomyelitis, other site G82.20  Paraplegia, unspecified Quantity: 1 : CPT4: 2951884 Debridement; bone (includes epidermis, dermis, subQ tissue, muscle and/or fascia, if performed) 1st 20 sqcm or less ICD-10 Diagnosis Description L89.894 Pressure ulcer of other site, stage 4 Quantity: 1 : CPT4: 1660630 Excisional Debridement, Bone (includes epidermis, dermis, subcutaneous tissue, muscle and/or fascia, if performed); each additional 20 sqcm, or part thereof ICD-10 Diagnosis Description L89.894 Pressure ulcer of other site, stage 4 Quantity: 1 Electronic Signature(s) Signed: 05/19/2023 12:55:12 PM By: Allen Derry PA-C Signed: 05/22/2023 2:04:51 PM By: Betha Loa Previous Signature: 05/18/2023 4:08:09 PM Version By: Allen Derry PA-C Entered By: Betha Loa on 05/18/2023 17:27:47

## 2023-05-18 NOTE — Progress Notes (Addendum)
Bradley, [name] ([id number]) [id number] Page 1 of 11 Visit Report for 05/18/2023 Arrival Information Details Patient Name: Date of Service: Bradley Pink [name] 05/18/2023 2:45 PM Medical Record Number: [id number] Patient Account Number: [id number] Date of Birth/Sex: Treating RN: 1946/01/29 (77 y.o. Bradley [name] Primary Care Tylen Leverich: Marisue Ivan Other Clinician: Betha Loa Referring Anayla Giannetti: Treating Yadir Zentner/Extender: Rebecka Apley in Treatment: 200 Visit Information History Since Last Visit All ordered tests and consults were completed: No Patient Arrived: Wheel Chair Added or deleted any medications: No Arrival Time: 15:01 Any new allergies or adverse reactions: No Transfer Assistance: Michiel Sites Lift Had [name] fall or experienced change in No Patient Identification Verified: Yes activities of daily living that may affect Secondary Verification Process Completed: Yes risk of falls: Patient Requires Transmission-Based Precautions: No Signs or symptoms of abuse/neglect since last visito No Patient Has Alerts: No Hospitalized since last visit: No Implantable device outside of the clinic excluding No cellular tissue based products placed in the center since last visit: Has Dressing in Place as Prescribed: Yes Pain Present Now: No Electronic Signature(s) Signed: 05/22/2023 2:04:51 PM By: Betha Loa Entered By: Betha Loa on 05/18/2023 15:02:03 -------------------------------------------------------------------------------- Clinic Level of Care Assessment Details Patient Name: Date of Service: Bradley Pink [name] 05/18/2023 2:45 PM Medical Record Number: [id number] Patient Account Number: [id number] Date of Birth/Sex: Treating RN: 06-03-1946 (77 y.o. Bradley [name] Primary Care Kashus Karlen: Marisue Ivan Other Clinician: Betha Loa Referring Zameer Borman: Treating Shilo Pauwels/Extender: Maxwell Caul Weeks in Treatment: 200 Clinic Level of Care Assessment Items TOOL 1 Quantity Score []  - 0 Use when EandM and Procedure is performed on INITIAL visit ASSESSMENTS - Nursing Assessment / Reassessment []  - 0 General Physical Exam (combine w/ comprehensive assessment (listed just below) when performed on new pt. evals) []  - 0 Comprehensive Assessment (HX, ROS, Risk Assessments, Wounds Hx, etc.) Bradley [name], Bradley [name] ([id number]) [id number] Page 2 of 11 ASSESSMENTS - Wound and Skin Assessment / Reassessment []  - 0 Dermatologic / Skin Assessment (not related to wound area) ASSESSMENTS - Ostomy and/or Continence Assessment and Care []  - 0 Incontinence Assessment and Management []  - 0 Ostomy Care Assessment and Management (repouching, etc.) PROCESS - Coordination of Care []  - 0 Simple Patient / Family Education for ongoing care []  - 0 Complex (extensive) Patient / Family Education for ongoing care []  - 0 Staff obtains Chiropractor, Records, T Results / Process Orders est []  - 0 Staff telephones HHA, Nursing Homes / Clarify orders / etc []  - 0 Routine Transfer to another Facility (non-emergent condition) []  - 0 Routine Hospital Admission (non-emergent condition) []  - 0 New Admissions / Manufacturing engineer / Ordering NPWT Apligraf, etc. , []  - 0 Emergency Hospital Admission (emergent condition) PROCESS - Special Needs []  - 0 Pediatric / Minor Patient Management []  - 0 Isolation Patient Management []  - 0 Hearing / Language / Visual special needs []  - 0 Assessment of Community assistance (transportation, D/C planning, etc.) []  - 0 Additional assistance / Altered mentation []  - 0 Support Surface(s) Assessment (bed, cushion, seat, etc.) INTERVENTIONS - Miscellaneous []  - 0 External ear exam []  - 0 Patient Transfer (multiple staff / Nurse, adult / Similar devices) []  - 0 Simple Staple / Suture removal (25 or less) []  - 0 Complex Staple / Suture  removal (26 or more) []  - 0 Hypo/Hyperglycemic Management (do not check if billed separately) []  - 0 Ankle / Brachial Index (ABI) - do not check if billed separately  Has the patient been seen at the hospital within the last three years: Yes Total Score: 0 Level Of Care: ____ Electronic Signature(s) Signed: 05/22/2023 2:04:51 PM By: Betha Loa Entered By: Betha Loa on 05/18/2023 17:26:30 -------------------------------------------------------------------------------- Encounter Discharge Information Details Patient Name: Date of Service: Bradley Pink [name] 05/18/2023 2:45 PM Medical Record Number: [id number] Patient Account Number: [id number] Date of Birth/Sex: Treating RN: 09-09-1945 (77 y.o. Bradley [name] Primary Care Kerstin Crusoe: Marisue Ivan Other Clinician: Betha Loa Referring Tevin Shillingford: Treating Timmie Dugue/Extender: Zella Ball [name] ([id number]) [id number] Page 3 of 11 Weeks in Treatment: 200 Encounter Discharge Information Items Discharge Condition: Stable Ambulatory Status: Wheelchair Discharge Destination: Home Transportation: Other Accompanied By: self Schedule Follow-up Appointment: Yes Clinical Summary of Care: Electronic Signature(s) Signed: 05/22/2023 2:04:51 PM By: Betha Loa Entered By: Betha Loa on 05/18/2023 16:21:50 -------------------------------------------------------------------------------- Lower Extremity Assessment Details Patient Name: Date of Service: Bradley Pink [name] 05/18/2023 2:45 PM Medical Record Number: [id number] Patient Account Number: [id number] Date of Birth/Sex: Treating RN: 03-Dec-1945 (77 y.o. Bradley [name] Primary Care Soraida Vickers: Marisue Ivan Other Clinician: Betha Loa Referring Ryenne Lynam: Treating Karine Garn/Extender: Maxwell Caul Weeks in Treatment: 200 Electronic Signature(s) Signed: 05/18/2023 4:31:35 PM By:  Angelina Pih Signed: 05/22/2023 2:04:51 PM By: Betha Loa Entered By: Betha Loa on 05/18/2023 15:43:56 -------------------------------------------------------------------------------- Multi Wound Chart Details Patient Name: Date of Service: Bradley Pink [name] 05/18/2023 2:45 PM Medical Record Number: [id number] Patient Account Number: [id number] Date of Birth/Sex: Treating RN: 25-Feb-1946 (77 y.o. Bradley [name] Primary Care Kingstyn Deruiter: Marisue Ivan Other Clinician: Betha Loa Referring Devora Tortorella: Treating Donisha Hoch/Extender: Maxwell Caul Weeks in Treatment: 200 Vital Signs Height(in): Pulse(bpm): 99 Weight(lbs): Blood Pressure(mmHg): 113/68 Body Mass Index(BMI): Temperature(F): 98.2 Respiratory Rate(breaths/min): 18 [Bradley [name], Bradley [name] ([id number]):Photos:] [phone].pdf Page 4 of 11:10 14 16] Midline Sacrum Left, Posterior Upper Leg Groin Wound Location: Pressure Injury Shear/Friction Shear/Friction Wounding Event: Pressure Ulcer Pressure Ulcer MASD Primary Etiology: Anemia, Hypertension, History of Anemia, Hypertension, History of Anemia, Hypertension, History of Comorbid History: pressure wounds, Rheumatoid Arthritis, pressure wounds, Rheumatoid Arthritis, pressure wounds, Rheumatoid Arthritis, Paraplegia Paraplegia Paraplegia 06/07/2015 11/05/2022 05/18/2023 Date Acquired: 200 26 0 Weeks of Treatment: Open Open Open Wound Status: No No No Wound Recurrence: No Yes Yes Clustered Wound: [name] 2 [name] Clustered Quantity: 11.1x5x4 12x10x4 20x10x0.1 Measurements Bradley x W x D (cm) 43.59 94.248 157.08 [name] (cm) : rea 174.358 376.991 15.708 Volume (cm) : 15.40% -4343.60% [name] % Reduction in [name] rea: 43.60% -1795095.20% [name] % Reduction in Volume: 12 Position 1 (o'clock): 8.5 Maximum Distance 1 (cm): [name] Yes [name] Tunneling: Category/Stage IV Category/Stage IV Full Thickness Without Exposed Classification: Support  Structures Medium Medium Medium Exudate Amount: Serosanguineous Serous Serosanguineous Exudate Type: red, brown amber red, brown Exudate Color: Epibole Thickened [name] Wound Margin: Large (67-100%) Large (67-100%) None Present (0%) Granulation Amount: Red, Pink Pink [name] Granulation Quality: Small (1-33%) None Present (0%) None Present (0%) Necrotic Amount: Fat Layer (Subcutaneous Tissue): Yes Fat Layer (Subcutaneous Tissue): Yes [name] Exposed Structures: Muscle: Yes Bone: Yes Fascia: No Fascia: No Tendon: No Tendon: No Joint: No Muscle: No Bone: No Joint: No None None [name] Epithelialization: Treatment Notes Electronic Signature(s) Signed: 05/22/2023 2:04:51 PM By: Betha Loa Entered By: Betha Loa on 05/18/2023 15:44:04 -------------------------------------------------------------------------------- Multi-Disciplinary Care Plan Details Patient Name: Date of Service: Bradley Pink [name] 05/18/2023 2:45 PM Medical Record Number: [id number] Patient Account Number: [id number] Date of Birth/Sex: Treating RN: 1945-06-29 (77 y.o. Bradley [name] Primary Care  Asmaa Tirpak: Marisue Ivan Other Clinician: Betha Loa Referring Daphanie Oquendo: Treating Amzie Sillas/Extender: Rebecka Apley in Treatment: [address] KEMO, FINKEL [name] ([id number]) [id number] Page 5 of 11 Electronic Signature(s) Signed: 05/18/2023 4:31:35 PM By: Angelina Pih Signed: 05/22/2023 2:04:51 PM By: Betha Loa Entered By: Betha Loa on 05/18/2023 16:07:29 -------------------------------------------------------------------------------- Pain Assessment Details Patient Name: Date of Service: Bradley Pink [name] 05/18/2023 2:45 PM Medical Record Number: [id number] Patient Account Number: [id number] Date of Birth/Sex: Treating RN: 1945-11-06 (77 y.o. Bradley [name] Primary Care Genell Thede: Marisue Ivan Other Clinician: Betha Loa Referring Charleen Madera: Treating Arneta Mahmood/Extender: Maxwell Caul Weeks in Treatment: 200 Active Problems Location of Pain Severity and Description of Pain Patient Has Paino No Site Locations Pain Management and Medication Current Pain Management: Electronic Signature(s) Signed: 05/18/2023 4:31:35 PM By: Angelina Pih Signed: 05/22/2023 2:04:51 PM By: Betha Loa Entered By: Betha Loa on 05/18/2023 15:04:59 Bradley [name] ([id number]) [id number] Page 6 of 11 -------------------------------------------------------------------------------- Patient/Caregiver Education Details Patient Name: Date of Service: Bradley Pink [name] 12/12/2024andnbsp2:45 PM Medical Record Number: [id number] Patient Account Number: [id number] Date of Birth/Gender: Treating RN: [birth year] (77 y.o. Bradley [name] Primary Care Physician: Marisue Ivan Other Clinician: Betha Loa Referring Physician: Treating Physician/Extender: Rebecka Apley in Treatment: 200 Education Assessment Education Provided To: Patient Education Topics Provided Infection: Handouts: Other: recommend going to ED. Per patient request wants to go to [facility] Methods: Explain/Verbal Responses: State content correctly Electronic Signature(s) Signed: 05/22/2023 2:04:51 PM By: Betha Loa Entered By: Betha Loa on 05/18/2023 16:08:08 -------------------------------------------------------------------------------- Wound Assessment Details Patient Name: Date of Service: Bradley Pink [name] 05/18/2023 2:45 PM Medical Record Number: [id number] Patient Account Number: [id number] Date of Birth/Sex: Treating RN: 1946/03/01 (77 y.o. Bradley [name] Primary Care Merci Walthers: Marisue Ivan Other Clinician: Betha Loa Referring Christien Frankl: Treating Soni Kegel/Extender: Maxwell Caul Weeks in Treatment: 200 Wound  Status Wound Number: 10 Primary Pressure Ulcer Etiology: Wound Location: Midline Sacrum Wound Open Wounding Event: Pressure Injury Status: Date Acquired: 06/07/2015 Comorbid Anemia, Hypertension, History of pressure wounds, Weeks Of Treatment: 200 History: Rheumatoid Arthritis, Paraplegia Clustered Wound: No Photos Bradley [name], Bradley [name] ([id number]) [phone].pdf Page 7 of 11 Wound Measurements Length: (cm) 11.1 Width: (cm) 5 Depth: (cm) 4 Area: (cm) 43.59 Volume: (cm) 174.358 % Reduction in Area: 15.4% % Reduction in Volume: 43.6% Epithelialization: None Wound Description Classification: Category/Stage IV Wound Margin: Epibole Exudate Amount: Medium Exudate Type: Serosanguineous Exudate Color: red, brown Foul Odor After Cleansing: No Slough/Fibrino Yes Wound Bed Granulation Amount: Large (67-100%) Exposed Structure Granulation Quality: Red, Pink Fascia Exposed: No Necrotic Amount: Small (1-33%) Fat Layer (Subcutaneous Tissue) Exposed: Yes Necrotic Quality: Adherent Slough Tendon Exposed: No Muscle Exposed: Yes Necrosis of Muscle: No Joint Exposed: No Bone Exposed: No Treatment Notes Wound #10 (Sacrum) Wound Laterality: Midline Cleanser Dakin 16 (oz) 0.25 Discharge Instruction: Use as directed. Peri-Wound Care Topical Primary Dressing Gauze Discharge Instruction: Moisten 2-3 gauze with Dakins and gently pack into wound Secondary Dressing Zetuvit Plus 4x8 (in/in) Secured With Medipore T - 73M Medipore H Soft Cloth Surgical T ape ape, 2x2 (in/yd) Compression Wrap Compression Stockings Add-Ons Electronic Signature(s) Signed: 05/18/2023 4:31:35 PM By: Angelina Pih Signed: 05/22/2023 2:04:51 PM By: Betha Loa Entered By: Betha Loa on 05/18/2023 15:37:53 Bradley [name] ([id number]) [id number].pdf Page 8 of 11 -------------------------------------------------------------------------------- Wound Assessment  Details Patient Name: Date of Service: Bradley Pink [name] 05/18/2023 2:45 PM Medical Record Number: [id number] Patient Account Number: [id number] Date of  Birth/Sex: Treating RN: 12-Apr-1946 (77 y.o. Bradley [name] Primary Care Kasaundra Fahrney: Marisue Ivan Other Clinician: Betha Loa Referring Lexii Walsh: Treating Octaviano Mukai/Extender: Maxwell Caul Weeks in Treatment: 200 Wound Status Wound Number: 14 Primary Pressure Ulcer Etiology: Wound Location: Left, Posterior Upper Leg Wound Open Wounding Event: Shear/Friction Status: Date Acquired: 11/05/2022 Comorbid Anemia, Hypertension, History of pressure wounds, Weeks Of Treatment: 26 History: Rheumatoid Arthritis, Paraplegia Clustered Wound: Yes Photos Wound Measurements Length: (cm) 12 Width: (cm) 10 Depth: (cm) 4 Clustered Quantity: 2 Area: (cm) 94 Volume: (cm) 37 % Reduction in Area: -4343.6% % Reduction in Volume: -1610960.4% Epithelialization: None Tunneling: Yes .248 Position (o'clock): 12 6.991 Maximum Distance: (cm) 8.5 Wound Description Classification: Category/Stage IV Wound Margin: Thickened Exudate Amount: Medium Exudate Type: Serous Exudate Color: amber Foul Odor After Cleansing: No Slough/Fibrino No Wound Bed Granulation Amount: Large (67-100%) Exposed Structure Granulation Quality: Pink Fascia Exposed: No Necrotic Amount: None Present (0%) Fat Layer (Subcutaneous Tissue) Exposed: Yes Tendon Exposed: No Muscle Exposed: No Joint Exposed: No Bone Exposed: Yes Treatment Notes Wound #14 (Upper Leg) Wound Laterality: Left, Posterior Cleanser Dakin 16 (oz) 0.25 Bradley [name], Bradley [name] ([id number]) [id number].pdf Page 9 of 11 Discharge Instruction: Use as directed. Peri-Wound Care Topical Primary Dressing Gauze Discharge Instruction: Moisten 2-3 gauze with Dakins and gently pack into wound and tunnel Secondary Dressing Zetuvit Plus 4x4 (in/in) Secured With Medipore  T - 59M Medipore H Soft Cloth Surgical T ape ape, 2x2 (in/yd) Compression Wrap Compression Stockings Add-Ons Electronic Signature(s) Signed: 05/18/2023 4:31:35 PM By: Angelina Pih Signed: 05/22/2023 2:04:51 PM By: Betha Loa Entered By: Betha Loa on 05/18/2023 15:38:43 -------------------------------------------------------------------------------- Wound Assessment Details Patient Name: Date of Service: Bradley Pink [name] 05/18/2023 2:45 PM Medical Record Number: [id number] Patient Account Number: [id number] Date of Birth/Sex: Treating RN: [birth year] (77 y.o. Bradley [name] Primary Care Puanani Gene: Marisue Ivan Other Clinician: Betha Loa Referring Alleyne Lac: Treating Conny Situ/Extender: Maxwell Caul Weeks in Treatment: 200 Wound Status Wound Number: 16 Primary MASD Etiology: Wound Location: Groin Wound Open Wounding Event: Shear/Friction Status: Date Acquired: 05/18/2023 Comorbid Anemia, Hypertension, History of pressure wounds, Weeks Of Treatment: 0 History: Rheumatoid Arthritis, Paraplegia Clustered Wound: Yes Photos Wound Measurements Length: (cm) 20 Width: (cm) 10 Depth: (cm) 0.1 Area: (cm) 157.08 LAJON, Bradley [name] ([id number]) Volume: (cm) 15.708 % Reduction in Area: % Reduction in Volume: [id number] Page 10 of 11 Wound Description Classification: Full Thickness Without Exposed Support Structures Exudate Amount: Medium Exudate Type: Serosanguineous Exudate Color: red, brown Foul Odor After Cleansing: No Slough/Fibrino Yes Wound Bed Granulation Amount: None Present (0%) Necrotic Amount: None Present (0%) Treatment Notes Wound #16 (Groin) Cleanser Dakin 16 (oz) 0.25 Discharge Instruction: Use as directed. Peri-Wound Care Topical Primary Dressing Gauze Discharge Instruction: Moisten 2-3 gauze with Dakins and gently pack into wound and tunnel Secondary Dressing Zetuvit Plus 4x4  (in/in) Secured With Medipore T - 59M Medipore H Soft Cloth Surgical T ape ape, 2x2 (in/yd) Compression Wrap Compression Stockings Add-Ons Electronic Signature(s) Signed: 05/18/2023 4:31:35 PM By: Angelina Pih Signed: 05/22/2023 2:04:51 PM By: Betha Loa Entered By: Betha Loa on 05/18/2023 15:36:52 -------------------------------------------------------------------------------- Vitals Details Patient Name: Date of Service: Bradley Pink [name] 05/18/2023 2:45 PM Medical Record Number: [id number] Patient Account Number: [id number] Date of Birth/Sex: Treating RN: Feb 23, 1946 (78 y.o. Bradley [name] Primary Care Prabhjot Maddux: Marisue Ivan Other Clinician: Betha Loa Referring Arman Loy: Treating Bradley [name]/Extender: Maxwell Caul Weeks in Treatment: 200 Vital Signs Time Taken: 15:02 Temperature (F): 98.2 Pulse (bpm): 99 Respiratory Rate (  breaths/min): 18 Blood Pressure (mmHg): 113/68 Reference Range: 80 - 120 mg / dl Bradley [name], Bradley [name] ([id number]) [phone].pdf Page 11 of 11 Electronic Signature(s) Signed: 05/22/2023 2:04:51 PM By: Betha Loa Entered By: Betha Loa on 05/18/2023 15:04:52

## 2023-05-19 DIAGNOSIS — L89329 Pressure ulcer of left buttock, unspecified stage: Secondary | ICD-10-CM | POA: Diagnosis not present

## 2023-05-19 DIAGNOSIS — R509 Fever, unspecified: Secondary | ICD-10-CM | POA: Diagnosis not present

## 2023-05-19 DIAGNOSIS — S31000A Unspecified open wound of lower back and pelvis without penetration into retroperitoneum, initial encounter: Secondary | ICD-10-CM | POA: Diagnosis not present

## 2023-05-19 DIAGNOSIS — N485 Ulcer of penis: Secondary | ICD-10-CM | POA: Diagnosis not present

## 2023-05-19 DIAGNOSIS — D508 Other iron deficiency anemias: Secondary | ICD-10-CM | POA: Diagnosis not present

## 2023-05-19 DIAGNOSIS — L89149 Pressure ulcer of left lower back, unspecified stage: Secondary | ICD-10-CM | POA: Diagnosis not present

## 2023-05-19 DIAGNOSIS — B9620 Unspecified Escherichia coli [E. coli] as the cause of diseases classified elsewhere: Secondary | ICD-10-CM | POA: Diagnosis not present

## 2023-05-19 DIAGNOSIS — L89154 Pressure ulcer of sacral region, stage 4: Secondary | ICD-10-CM | POA: Diagnosis not present

## 2023-05-19 DIAGNOSIS — M869 Osteomyelitis, unspecified: Secondary | ICD-10-CM | POA: Diagnosis not present

## 2023-05-19 DIAGNOSIS — B9689 Other specified bacterial agents as the cause of diseases classified elsewhere: Secondary | ICD-10-CM | POA: Diagnosis not present

## 2023-05-19 DIAGNOSIS — M4628 Osteomyelitis of vertebra, sacral and sacrococcygeal region: Secondary | ICD-10-CM | POA: Diagnosis not present

## 2023-05-19 DIAGNOSIS — A499 Bacterial infection, unspecified: Secondary | ICD-10-CM | POA: Diagnosis not present

## 2023-05-19 DIAGNOSIS — R338 Other retention of urine: Secondary | ICD-10-CM | POA: Diagnosis not present

## 2023-05-19 DIAGNOSIS — L89159 Pressure ulcer of sacral region, unspecified stage: Secondary | ICD-10-CM | POA: Diagnosis not present

## 2023-05-19 DIAGNOSIS — D72829 Elevated white blood cell count, unspecified: Secondary | ICD-10-CM | POA: Diagnosis not present

## 2023-05-19 DIAGNOSIS — R41841 Cognitive communication deficit: Secondary | ICD-10-CM | POA: Diagnosis not present

## 2023-05-19 DIAGNOSIS — S31829A Unspecified open wound of left buttock, initial encounter: Secondary | ICD-10-CM | POA: Diagnosis not present

## 2023-05-19 DIAGNOSIS — R1310 Dysphagia, unspecified: Secondary | ICD-10-CM | POA: Diagnosis not present

## 2023-05-19 DIAGNOSIS — D509 Iron deficiency anemia, unspecified: Secondary | ICD-10-CM | POA: Diagnosis not present

## 2023-05-19 DIAGNOSIS — Z87440 Personal history of urinary (tract) infections: Secondary | ICD-10-CM | POA: Diagnosis not present

## 2023-05-19 DIAGNOSIS — L089 Local infection of the skin and subcutaneous tissue, unspecified: Secondary | ICD-10-CM | POA: Diagnosis not present

## 2023-05-19 DIAGNOSIS — M6281 Muscle weakness (generalized): Secondary | ICD-10-CM | POA: Diagnosis not present

## 2023-05-19 DIAGNOSIS — G8220 Paraplegia, unspecified: Secondary | ICD-10-CM | POA: Diagnosis not present

## 2023-05-19 DIAGNOSIS — S3120XA Unspecified open wound of penis, initial encounter: Secondary | ICD-10-CM | POA: Diagnosis not present

## 2023-05-19 DIAGNOSIS — N319 Neuromuscular dysfunction of bladder, unspecified: Secondary | ICD-10-CM | POA: Diagnosis not present

## 2023-05-19 DIAGNOSIS — E46 Unspecified protein-calorie malnutrition: Secondary | ICD-10-CM | POA: Diagnosis not present

## 2023-05-19 DIAGNOSIS — D638 Anemia in other chronic diseases classified elsewhere: Secondary | ICD-10-CM | POA: Diagnosis not present

## 2023-05-19 DIAGNOSIS — S24103A Unspecified injury at T7-T10 level of thoracic spinal cord, initial encounter: Secondary | ICD-10-CM | POA: Diagnosis not present

## 2023-05-19 DIAGNOSIS — L89319 Pressure ulcer of right buttock, unspecified stage: Secondary | ICD-10-CM | POA: Diagnosis not present

## 2023-05-19 DIAGNOSIS — R5381 Other malaise: Secondary | ICD-10-CM | POA: Diagnosis not present

## 2023-05-19 DIAGNOSIS — I10 Essential (primary) hypertension: Secondary | ICD-10-CM | POA: Diagnosis not present

## 2023-05-19 DIAGNOSIS — L89324 Pressure ulcer of left buttock, stage 4: Secondary | ICD-10-CM | POA: Diagnosis not present

## 2023-05-19 DIAGNOSIS — L89156 Pressure-induced deep tissue damage of sacral region: Secondary | ICD-10-CM | POA: Diagnosis not present

## 2023-05-19 DIAGNOSIS — R279 Unspecified lack of coordination: Secondary | ICD-10-CM | POA: Diagnosis not present

## 2023-05-19 DIAGNOSIS — Z933 Colostomy status: Secondary | ICD-10-CM | POA: Diagnosis not present

## 2023-05-20 DIAGNOSIS — M4628 Osteomyelitis of vertebra, sacral and sacrococcygeal region: Secondary | ICD-10-CM | POA: Diagnosis not present

## 2023-05-20 DIAGNOSIS — L89159 Pressure ulcer of sacral region, unspecified stage: Secondary | ICD-10-CM | POA: Diagnosis not present

## 2023-06-05 ENCOUNTER — Ambulatory Visit: Payer: Medicare HMO | Admitting: Internal Medicine

## 2023-06-05 DIAGNOSIS — N319 Neuromuscular dysfunction of bladder, unspecified: Secondary | ICD-10-CM | POA: Diagnosis not present

## 2023-06-05 DIAGNOSIS — L89626 Pressure-induced deep tissue damage of left heel: Secondary | ICD-10-CM | POA: Diagnosis not present

## 2023-06-05 DIAGNOSIS — Z978 Presence of other specified devices: Secondary | ICD-10-CM | POA: Diagnosis not present

## 2023-06-05 DIAGNOSIS — R52 Pain, unspecified: Secondary | ICD-10-CM | POA: Diagnosis not present

## 2023-06-05 DIAGNOSIS — M6281 Muscle weakness (generalized): Secondary | ICD-10-CM | POA: Diagnosis not present

## 2023-06-05 DIAGNOSIS — R5381 Other malaise: Secondary | ICD-10-CM | POA: Diagnosis not present

## 2023-06-05 DIAGNOSIS — R059 Cough, unspecified: Secondary | ICD-10-CM | POA: Diagnosis not present

## 2023-06-05 DIAGNOSIS — L89324 Pressure ulcer of left buttock, stage 4: Secondary | ICD-10-CM | POA: Diagnosis not present

## 2023-06-05 DIAGNOSIS — L89616 Pressure-induced deep tissue damage of right heel: Secondary | ICD-10-CM | POA: Diagnosis not present

## 2023-06-05 DIAGNOSIS — Z933 Colostomy status: Secondary | ICD-10-CM | POA: Diagnosis not present

## 2023-06-05 DIAGNOSIS — Z452 Encounter for adjustment and management of vascular access device: Secondary | ICD-10-CM | POA: Diagnosis not present

## 2023-06-05 DIAGNOSIS — G8220 Paraplegia, unspecified: Secondary | ICD-10-CM | POA: Diagnosis not present

## 2023-06-05 DIAGNOSIS — B9620 Unspecified Escherichia coli [E. coli] as the cause of diseases classified elsewhere: Secondary | ICD-10-CM | POA: Diagnosis not present

## 2023-06-05 DIAGNOSIS — N390 Urinary tract infection, site not specified: Secondary | ICD-10-CM | POA: Diagnosis not present

## 2023-06-05 DIAGNOSIS — A499 Bacterial infection, unspecified: Secondary | ICD-10-CM | POA: Diagnosis not present

## 2023-06-05 DIAGNOSIS — D509 Iron deficiency anemia, unspecified: Secondary | ICD-10-CM | POA: Diagnosis not present

## 2023-06-05 DIAGNOSIS — M4628 Osteomyelitis of vertebra, sacral and sacrococcygeal region: Secondary | ICD-10-CM | POA: Diagnosis not present

## 2023-06-05 DIAGNOSIS — W3400XA Accidental discharge from unspecified firearms or gun, initial encounter: Secondary | ICD-10-CM | POA: Diagnosis not present

## 2023-06-05 DIAGNOSIS — Z7689 Persons encountering health services in other specified circumstances: Secondary | ICD-10-CM | POA: Diagnosis not present

## 2023-06-05 DIAGNOSIS — L89154 Pressure ulcer of sacral region, stage 4: Secondary | ICD-10-CM | POA: Diagnosis not present

## 2023-06-05 DIAGNOSIS — R41841 Cognitive communication deficit: Secondary | ICD-10-CM | POA: Diagnosis not present

## 2023-06-05 DIAGNOSIS — S81809A Unspecified open wound, unspecified lower leg, initial encounter: Secondary | ICD-10-CM | POA: Diagnosis not present

## 2023-06-05 DIAGNOSIS — R279 Unspecified lack of coordination: Secondary | ICD-10-CM | POA: Diagnosis not present

## 2023-06-05 DIAGNOSIS — I10 Essential (primary) hypertension: Secondary | ICD-10-CM | POA: Diagnosis not present

## 2023-06-05 DIAGNOSIS — D638 Anemia in other chronic diseases classified elsewhere: Secondary | ICD-10-CM | POA: Diagnosis not present

## 2023-06-05 DIAGNOSIS — R1310 Dysphagia, unspecified: Secondary | ICD-10-CM | POA: Diagnosis not present

## 2023-06-05 DIAGNOSIS — E559 Vitamin D deficiency, unspecified: Secondary | ICD-10-CM | POA: Diagnosis not present

## 2023-06-06 DIAGNOSIS — D509 Iron deficiency anemia, unspecified: Secondary | ICD-10-CM | POA: Diagnosis not present

## 2023-06-06 DIAGNOSIS — E559 Vitamin D deficiency, unspecified: Secondary | ICD-10-CM | POA: Diagnosis not present

## 2023-06-06 DIAGNOSIS — I10 Essential (primary) hypertension: Secondary | ICD-10-CM | POA: Diagnosis not present

## 2023-06-06 DIAGNOSIS — R52 Pain, unspecified: Secondary | ICD-10-CM | POA: Diagnosis not present

## 2023-06-06 DIAGNOSIS — M4628 Osteomyelitis of vertebra, sacral and sacrococcygeal region: Secondary | ICD-10-CM | POA: Diagnosis not present

## 2023-06-08 DIAGNOSIS — L89154 Pressure ulcer of sacral region, stage 4: Secondary | ICD-10-CM | POA: Diagnosis not present

## 2023-06-08 DIAGNOSIS — L89616 Pressure-induced deep tissue damage of right heel: Secondary | ICD-10-CM | POA: Diagnosis not present

## 2023-06-08 DIAGNOSIS — L89324 Pressure ulcer of left buttock, stage 4: Secondary | ICD-10-CM | POA: Diagnosis not present

## 2023-06-08 DIAGNOSIS — L89626 Pressure-induced deep tissue damage of left heel: Secondary | ICD-10-CM | POA: Diagnosis not present

## 2023-06-09 DIAGNOSIS — M4628 Osteomyelitis of vertebra, sacral and sacrococcygeal region: Secondary | ICD-10-CM | POA: Diagnosis not present

## 2023-06-09 DIAGNOSIS — Z7689 Persons encountering health services in other specified circumstances: Secondary | ICD-10-CM | POA: Diagnosis not present

## 2023-06-09 DIAGNOSIS — R52 Pain, unspecified: Secondary | ICD-10-CM | POA: Diagnosis not present

## 2023-06-12 DIAGNOSIS — L89626 Pressure-induced deep tissue damage of left heel: Secondary | ICD-10-CM | POA: Diagnosis not present

## 2023-06-12 DIAGNOSIS — L89154 Pressure ulcer of sacral region, stage 4: Secondary | ICD-10-CM | POA: Diagnosis not present

## 2023-06-12 DIAGNOSIS — L89616 Pressure-induced deep tissue damage of right heel: Secondary | ICD-10-CM | POA: Diagnosis not present

## 2023-06-12 DIAGNOSIS — L89324 Pressure ulcer of left buttock, stage 4: Secondary | ICD-10-CM | POA: Diagnosis not present

## 2023-06-13 DIAGNOSIS — M4628 Osteomyelitis of vertebra, sacral and sacrococcygeal region: Secondary | ICD-10-CM | POA: Diagnosis not present

## 2023-06-13 DIAGNOSIS — I10 Essential (primary) hypertension: Secondary | ICD-10-CM | POA: Diagnosis not present
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
## 2023-06-14 ENCOUNTER — Ambulatory Visit: Payer: Medicare HMO | Admitting: Urology

## 2023-06-14 DIAGNOSIS — I10 Essential (primary) hypertension: Secondary | ICD-10-CM | POA: Diagnosis not present

## 2023-06-14 DIAGNOSIS — W3400XA Accidental discharge from unspecified firearms or gun, initial encounter: Secondary | ICD-10-CM | POA: Diagnosis not present

## 2023-06-14 DIAGNOSIS — N390 Urinary tract infection, site not specified: Secondary | ICD-10-CM | POA: Diagnosis not present

## 2023-06-14 DIAGNOSIS — G8220 Paraplegia, unspecified: Secondary | ICD-10-CM | POA: Diagnosis not present

## 2023-06-14 DIAGNOSIS — M4628 Osteomyelitis of vertebra, sacral and sacrococcygeal region: Secondary | ICD-10-CM | POA: Diagnosis not present

## 2023-06-16 DIAGNOSIS — Z978 Presence of other specified devices: Secondary | ICD-10-CM | POA: Diagnosis not present

## 2023-06-16 DIAGNOSIS — Z452 Encounter for adjustment and management of vascular access device: Secondary | ICD-10-CM | POA: Diagnosis not present

## 2023-06-16 DIAGNOSIS — S81809A Unspecified open wound, unspecified lower leg, initial encounter: Secondary | ICD-10-CM | POA: Diagnosis not present

## 2023-06-19 DIAGNOSIS — M4628 Osteomyelitis of vertebra, sacral and sacrococcygeal region: Secondary | ICD-10-CM | POA: Diagnosis not present

## 2023-06-20 ENCOUNTER — Inpatient Hospital Stay: Payer: Medicare HMO | Admitting: Infectious Diseases

## 2023-06-20 DIAGNOSIS — R52 Pain, unspecified: Secondary | ICD-10-CM | POA: Diagnosis not present

## 2023-06-21 DIAGNOSIS — M4628 Osteomyelitis of vertebra, sacral and sacrococcygeal region: Secondary | ICD-10-CM | POA: Diagnosis not present

## 2023-06-26 DIAGNOSIS — M4628 Osteomyelitis of vertebra, sacral and sacrococcygeal region: Secondary | ICD-10-CM | POA: Diagnosis not present

## 2023-06-28 DIAGNOSIS — M4628 Osteomyelitis of vertebra, sacral and sacrococcygeal region: Secondary | ICD-10-CM | POA: Diagnosis not present

## 2023-06-28 DIAGNOSIS — R52 Pain, unspecified: Secondary | ICD-10-CM | POA: Diagnosis not present

## 2023-06-28 DIAGNOSIS — S81809A Unspecified open wound, unspecified lower leg, initial encounter: Secondary | ICD-10-CM | POA: Diagnosis not present

## 2023-06-28 DIAGNOSIS — G8220 Paraplegia, unspecified: Secondary | ICD-10-CM | POA: Diagnosis not present

## 2023-06-29 DIAGNOSIS — Z978 Presence of other specified devices: Secondary | ICD-10-CM | POA: Diagnosis not present

## 2023-06-29 DIAGNOSIS — R059 Cough, unspecified: Secondary | ICD-10-CM | POA: Diagnosis not present

## 2023-07-05 DIAGNOSIS — M4628 Osteomyelitis of vertebra, sacral and sacrococcygeal region: Secondary | ICD-10-CM | POA: Diagnosis not present

## 2023-07-07 DIAGNOSIS — M4628 Osteomyelitis of vertebra, sacral and sacrococcygeal region: Secondary | ICD-10-CM | POA: Diagnosis not present

## 2023-07-07 DIAGNOSIS — L89616 Pressure-induced deep tissue damage of right heel: Secondary | ICD-10-CM | POA: Diagnosis not present

## 2023-07-07 DIAGNOSIS — Z466 Encounter for fitting and adjustment of urinary device: Secondary | ICD-10-CM | POA: Diagnosis not present

## 2023-07-07 DIAGNOSIS — Z433 Encounter for attention to colostomy: Secondary | ICD-10-CM | POA: Diagnosis not present

## 2023-07-07 DIAGNOSIS — L89324 Pressure ulcer of left buttock, stage 4: Secondary | ICD-10-CM | POA: Diagnosis not present

## 2023-07-07 DIAGNOSIS — L89154 Pressure ulcer of sacral region, stage 4: Secondary | ICD-10-CM | POA: Diagnosis not present

## 2023-07-07 DIAGNOSIS — N319 Neuromuscular dysfunction of bladder, unspecified: Secondary | ICD-10-CM | POA: Diagnosis not present

## 2023-07-07 DIAGNOSIS — L89626 Pressure-induced deep tissue damage of left heel: Secondary | ICD-10-CM | POA: Diagnosis not present

## 2023-07-07 DIAGNOSIS — S80212D Abrasion, left knee, subsequent encounter: Secondary | ICD-10-CM | POA: Diagnosis not present

## 2023-07-08 DIAGNOSIS — Z433 Encounter for attention to colostomy: Secondary | ICD-10-CM | POA: Diagnosis not present

## 2023-07-08 DIAGNOSIS — Z466 Encounter for fitting and adjustment of urinary device: Secondary | ICD-10-CM | POA: Diagnosis not present

## 2023-07-08 DIAGNOSIS — N319 Neuromuscular dysfunction of bladder, unspecified: Secondary | ICD-10-CM | POA: Diagnosis not present

## 2023-07-08 DIAGNOSIS — S80212D Abrasion, left knee, subsequent encounter: Secondary | ICD-10-CM | POA: Diagnosis not present

## 2023-07-08 DIAGNOSIS — L89626 Pressure-induced deep tissue damage of left heel: Secondary | ICD-10-CM | POA: Diagnosis not present

## 2023-07-08 DIAGNOSIS — L89324 Pressure ulcer of left buttock, stage 4: Secondary | ICD-10-CM | POA: Diagnosis not present

## 2023-07-08 DIAGNOSIS — M4628 Osteomyelitis of vertebra, sacral and sacrococcygeal region: Secondary | ICD-10-CM | POA: Diagnosis not present

## 2023-07-08 DIAGNOSIS — L89616 Pressure-induced deep tissue damage of right heel: Secondary | ICD-10-CM | POA: Diagnosis not present

## 2023-07-08 DIAGNOSIS — L89154 Pressure ulcer of sacral region, stage 4: Secondary | ICD-10-CM | POA: Diagnosis not present

## 2023-07-10 DIAGNOSIS — M4628 Osteomyelitis of vertebra, sacral and sacrococcygeal region: Secondary | ICD-10-CM | POA: Diagnosis not present

## 2023-07-10 DIAGNOSIS — N319 Neuromuscular dysfunction of bladder, unspecified: Secondary | ICD-10-CM | POA: Diagnosis not present

## 2023-07-10 DIAGNOSIS — Z466 Encounter for fitting and adjustment of urinary device: Secondary | ICD-10-CM | POA: Diagnosis not present

## 2023-07-10 DIAGNOSIS — L89626 Pressure-induced deep tissue damage of left heel: Secondary | ICD-10-CM | POA: Diagnosis not present

## 2023-07-10 DIAGNOSIS — L89324 Pressure ulcer of left buttock, stage 4: Secondary | ICD-10-CM | POA: Diagnosis not present

## 2023-07-10 DIAGNOSIS — Z433 Encounter for attention to colostomy: Secondary | ICD-10-CM | POA: Diagnosis not present

## 2023-07-10 DIAGNOSIS — L89616 Pressure-induced deep tissue damage of right heel: Secondary | ICD-10-CM | POA: Diagnosis not present

## 2023-07-10 DIAGNOSIS — S80212D Abrasion, left knee, subsequent encounter: Secondary | ICD-10-CM | POA: Diagnosis not present

## 2023-07-10 DIAGNOSIS — L89154 Pressure ulcer of sacral region, stage 4: Secondary | ICD-10-CM | POA: Diagnosis not present

## 2023-07-11 ENCOUNTER — Encounter: Payer: Self-pay | Admitting: Oncology

## 2023-07-11 ENCOUNTER — Encounter: Payer: Self-pay | Admitting: Infectious Diseases

## 2023-07-11 ENCOUNTER — Ambulatory Visit: Payer: Medicare HMO | Attending: Infectious Diseases | Admitting: Infectious Diseases

## 2023-07-11 ENCOUNTER — Other Ambulatory Visit
Admission: RE | Admit: 2023-07-11 | Discharge: 2023-07-11 | Disposition: A | Discharge status: Home / Self Care | Payer: Medicare HMO | Source: Ambulatory Visit | Attending: Infectious Diseases | Admitting: Infectious Diseases

## 2023-07-11 VITALS — BP 113/66 | HR 102 | Temp 96.6°F

## 2023-07-11 DIAGNOSIS — L89156 Pressure-induced deep tissue damage of sacral region: Secondary | ICD-10-CM | POA: Diagnosis not present

## 2023-07-11 DIAGNOSIS — L89613 Pressure ulcer of right heel, stage 3: Secondary | ICD-10-CM | POA: Insufficient documentation

## 2023-07-11 DIAGNOSIS — L97422 Non-pressure chronic ulcer of left heel and midfoot with fat layer exposed: Secondary | ICD-10-CM

## 2023-07-11 DIAGNOSIS — Z933 Colostomy status: Secondary | ICD-10-CM | POA: Diagnosis not present

## 2023-07-11 DIAGNOSIS — L97421 Non-pressure chronic ulcer of left heel and midfoot limited to breakdown of skin: Secondary | ICD-10-CM

## 2023-07-11 DIAGNOSIS — G8220 Paraplegia, unspecified: Secondary | ICD-10-CM | POA: Diagnosis not present

## 2023-07-11 DIAGNOSIS — L89623 Pressure ulcer of left heel, stage 3: Secondary | ICD-10-CM | POA: Insufficient documentation

## 2023-07-11 DIAGNOSIS — Z87891 Personal history of nicotine dependence: Secondary | ICD-10-CM | POA: Diagnosis not present

## 2023-07-11 DIAGNOSIS — L89329 Pressure ulcer of left buttock, unspecified stage: Secondary | ICD-10-CM | POA: Diagnosis present

## 2023-07-11 NOTE — Progress Notes (Signed)
 NAME: Bradley [name]  DOB: 1945-07-16  MRN: [id number]  Date/Time: 07/11/2023 12:15 PM   Subjective:  Bradley [name] is a 78 y.o. with a history of with a history of Hypertension, T7 spinal cord injury with paraplegia, neurogenic bladder, BPH iron  deficiency anemia, hyperlipidemia, status post diverting colostomy, chronic sacral /ischial ulcer  Here for urgent visit for heel ulcers with green discharge Pt was in [facility]between 05/19/2023 until 06/05/2023 for infected sacral decubitus ulcers.  He underwent debridement of the wounds by general surgery and a wound VAC was placed on 05/24/2023.  Culture of the skin and sacral ulcers grew MDR E. coli.  He was treated with Zosyn  and then meropenem  and was discharged to skilled nursing facility on meropenem  to complete 3 weeks from the date of debridement until 06/13/2023.  From the skilled nursing facility he went home on 07/07/2023.  He has not had a wound VAC since then.  The nurse comes home 3 times a week to do his dressing She noted that he was having greenish discharge from the heel ulcer. He called the [facility] wound clinic and appointment was not till March.  Then he also called [facility] and could not get an appointment.  So he he called our clinic and came to see me today.  He does not have fever or chills  Prior to this admission he was at [facility] in August 2024 at that time the culture was Proteus and he was treated with vancomycin  and meropenem  While in the hospital and then discharged on p.o. Levaquin  for additional 4 weeks with significant improvement.  He was being followed at the wound clinic but since his admission in December to [facility] has not returned to [facility] wound clinic   Patient is here with his son today.  Past Medical History:  Diagnosis Date   Anemia    Arthritis    Paralysis of both lower limbs (HCC)    Sacral decubitus ulcer     Past Surgical History:  Procedure Laterality Date   COLONOSCOPY  06/06/2014   INCISION AND DRAINAGE OF WOUND N/A  06/04/2018   Procedure: IRRIGATION AND DEBRIDEMENT WOUND;  Surgeon: Desiderio Schanz, MD;  Location: [facility] ORS;  Service: General;  Laterality: N/A;   TONSILLECTOMY     WOUND DEBRIDEMENT Left 01/28/2023   Procedure: LEFT LEG DEBRIDEMENT OF ULCER NEAR GLUTEUS;  Surgeon: Tye Millet, DO;  Location: [facility] ORS;  Service: General;  Laterality: Left;    Social History   Socioeconomic History   Marital status: Widowed    Spouse name: Not on file   Number of children: Not on file   Years of education: Not on file   Highest education level: Not on file  Occupational History   Not on file  Tobacco Use   Smoking status: Former    Current packs/day: 0.00    Average packs/day: 0.5 packs/day for 5.0 years (2.5 ttl pk-yrs)    Types: Cigarettes    Start date: 62    Quit date: 61    Years since quitting: 53.1   Smokeless tobacco: Never  Vaping Use   Vaping status: Never Used  Substance and Sexual Activity   Alcohol use: No   Drug use: No   Sexual activity: Not on file  Other Topics Concern   Not on file  Social History Narrative   Not on file   Social Drivers of Health   Financial Resource Strain: Low Risk  (05/22/2023)   Received from [facility]  Overall Financial Resource Strain (CARDIA)    Difficulty of Paying Living Expenses: Not hard at all  Food Insecurity: No Food Insecurity (05/22/2023)   Received from [facility]   Hunger Vital Sign    Worried About Running Out of Food in the Last Year: Never true    Ran Out of Food in the Last Year: Never true  Transportation Needs: No Transportation Needs (05/22/2023)   [facility] from [facility] - Transportation    Lack of Transportation (Medical): No    Lack of Transportation (Non-Medical): No  Physical Activity: Not on file  Stress: Not on file  Social Connections: Not on file  Intimate Partner Violence: Not At Risk (05/04/2023)   Humiliation, Afraid, Rape, and Kick questionnaire    Fear of Current or  Ex-Partner: No    Emotionally Abused: No    Physically Abused: No    Sexually Abused: No    Family History  Problem Relation Age of Onset   Breast cancer Mother    Allergies  Allergen Reactions   Lisinopril Cough   I? Current Outpatient Medications  Medication Sig Dispense Refill   acetaminophen  (TYLENOL ) 500 MG tablet Take 1,000 mg by mouth 3 (three) times daily.      chlorthalidone  (HYGROTON ) 25 MG tablet Take 25 mg by mouth daily.     ferrous sulfate  325 (65 FE) MG EC tablet Take 1 tablet (325 mg total) by mouth in the morning and at bedtime. 60 tablet 2   Multiple Vitamin (MULTI-VITAMINS) TABS Take 1 tablet by mouth daily.      oxyCODONE  (OXY IR/ROXICODONE ) 5 MG immediate release tablet Take 1-2 tablets (5-10 mg total) by mouth every 4 (four) hours as needed for severe pain (pain score 7-10) or moderate pain (pain score 4-6). 30 tablet 0   polyethylene glycol powder (GLYCOLAX /MIRALAX ) powder Take 17 g by mouth daily as needed for mild constipation.      vitamin C  (ASCORBIC ACID ) 500 MG tablet Take 500 mg by mouth 2 (two) times daily.     simethicone  (MYLICON) 80 MG chewable tablet Chew 1.5 tablets (120 mg total) by mouth 4 (four) times daily for 10 days. 60 tablet 0   No current facility-administered medications for this visit.     Abtx:  Anti-infectives (From admission, onward)    None       REVIEW OF SYSTEMS:  Const: negative fever, negative chills, negative weight loss Eyes: negative diplopia or visual changes, negative eye pain ENT: negative coryza, negative sore throat Resp: negative cough, hemoptysis, dyspnea Cards: negative for chest pain, palpitations, lower extremity edema GU: CIC GI: Negative for abdominal pain, diarrhea, bleeding, constipation Skin: negative for rash and pruritus Heme: negative for easy bruising and gum/nose bleeding MS: negative for myalgias, arthralgias, back pain and muscle weakness Neurolo:paraplegia Psych: negative for feelings of  anxiety, depression  Endocrine: negative for thyroid , diabetes Allergy/Immunology- as above Objective:  VITALS:  BP 113/66   Pulse (!) 102   Temp (!) 96.6 F (35.9 C) (Temporal)   PHYSICAL EXAM:  General: Alert, cooperative, no distress, appears young for age-in wheel chair Head: Normocephalic, without obvious abnormality, atraumatic. Eyes: Conjunctivae clear, anicteric sclerae. Pupils are equal ENT Nares normal. No drainage or sinus tenderness. Lips, mucosa, and tongue normal. No Thrush Neck: Supple, symmetrical, no adenopathy, thyroid : non tender no carotid bruit and no JVD. Back wound was examined. Reddish base but some glistening      lungs: Clear to auscultation bilaterally.  No Wheezing or Rhonchi. No rales. Heart: Regular rate and rhythm, no murmur, rub or gallop. Abdomen: Soft, colostomy Both heel ulcers are superficial the left has a bigger eschar and right a very small eschar The left has some greenish discharge Culture has been taken and sent      Skin: Not examined lymph: Cervical, supraclavicular normal. Neurologic: did not examine   ? Impression/Recommendation Chronic left ischial decubitus ulcer recently  with 4 weeks of antibiotics at [facility] and completed on 06/13/2023 at rehab.  Was on meropenem .  The wound looks okay and no obvious infection He needs a wound VAC  Bilateral heel ulcers stage III Has eschar Greenish discharge Culture taken Depending on the culture may do a short course of antibiotic He needs to follow-up with the wound clinic for further topical management.  Patient has diverting colostomy  Paraplegia secondary to spinal injury ___________________________________________________ Discussed with patient, and his son. Also informed wound clinic for follow-up Note:  This document was prepared using Dragon voice recognition software and may include unintentional dictation errors.

## 2023-07-11 NOTE — Patient Instructions (Signed)
 You have b/l pressure wounds on the hell and stage IV sacrum decub The left heel ulcer has a eschar, some discharge- taken a culture and will tailor antibiotic if needed Meanwhile follow with wound clinic for management of pressure wounds on your sacrum and heels

## 2023-07-12 ENCOUNTER — Encounter: Payer: Self-pay | Admitting: Oncology

## 2023-07-12 DIAGNOSIS — S80212D Abrasion, left knee, subsequent encounter: Secondary | ICD-10-CM | POA: Diagnosis not present

## 2023-07-12 DIAGNOSIS — Z993 Dependence on wheelchair: Secondary | ICD-10-CM | POA: Diagnosis not present

## 2023-07-12 DIAGNOSIS — G8220 Paraplegia, unspecified: Secondary | ICD-10-CM | POA: Diagnosis not present

## 2023-07-12 DIAGNOSIS — L89324 Pressure ulcer of left buttock, stage 4: Secondary | ICD-10-CM | POA: Diagnosis not present

## 2023-07-12 DIAGNOSIS — L89154 Pressure ulcer of sacral region, stage 4: Secondary | ICD-10-CM | POA: Diagnosis not present

## 2023-07-12 DIAGNOSIS — R252 Cramp and spasm: Secondary | ICD-10-CM | POA: Diagnosis not present

## 2023-07-12 DIAGNOSIS — L89616 Pressure-induced deep tissue damage of right heel: Secondary | ICD-10-CM | POA: Diagnosis not present

## 2023-07-12 DIAGNOSIS — L89626 Pressure-induced deep tissue damage of left heel: Secondary | ICD-10-CM | POA: Diagnosis not present

## 2023-07-12 DIAGNOSIS — Z466 Encounter for fitting and adjustment of urinary device: Secondary | ICD-10-CM | POA: Diagnosis not present

## 2023-07-12 DIAGNOSIS — Z433 Encounter for attention to colostomy: Secondary | ICD-10-CM | POA: Diagnosis not present

## 2023-07-12 DIAGNOSIS — M4628 Osteomyelitis of vertebra, sacral and sacrococcygeal region: Secondary | ICD-10-CM | POA: Diagnosis not present

## 2023-07-14 DIAGNOSIS — Z466 Encounter for fitting and adjustment of urinary device: Secondary | ICD-10-CM | POA: Diagnosis not present

## 2023-07-14 DIAGNOSIS — M4628 Osteomyelitis of vertebra, sacral and sacrococcygeal region: Secondary | ICD-10-CM | POA: Diagnosis not present

## 2023-07-14 DIAGNOSIS — L89616 Pressure-induced deep tissue damage of right heel: Secondary | ICD-10-CM | POA: Diagnosis not present

## 2023-07-14 DIAGNOSIS — Z433 Encounter for attention to colostomy: Secondary | ICD-10-CM | POA: Diagnosis not present

## 2023-07-14 DIAGNOSIS — N319 Neuromuscular dysfunction of bladder, unspecified: Secondary | ICD-10-CM | POA: Diagnosis not present

## 2023-07-14 DIAGNOSIS — S80212D Abrasion, left knee, subsequent encounter: Secondary | ICD-10-CM | POA: Diagnosis not present

## 2023-07-14 DIAGNOSIS — L89626 Pressure-induced deep tissue damage of left heel: Secondary | ICD-10-CM | POA: Diagnosis not present

## 2023-07-14 DIAGNOSIS — L89324 Pressure ulcer of left buttock, stage 4: Secondary | ICD-10-CM | POA: Diagnosis not present

## 2023-07-14 DIAGNOSIS — L89154 Pressure ulcer of sacral region, stage 4: Secondary | ICD-10-CM | POA: Diagnosis not present

## 2023-07-15 LAB — AEROBIC CULTURE W GRAM STAIN (SUPERFICIAL SPECIMEN)

## 2023-07-18 DIAGNOSIS — N319 Neuromuscular dysfunction of bladder, unspecified: Secondary | ICD-10-CM | POA: Diagnosis not present

## 2023-07-18 DIAGNOSIS — Z466 Encounter for fitting and adjustment of urinary device: Secondary | ICD-10-CM | POA: Diagnosis not present

## 2023-07-18 DIAGNOSIS — Z433 Encounter for attention to colostomy: Secondary | ICD-10-CM | POA: Diagnosis not present

## 2023-07-18 DIAGNOSIS — L89324 Pressure ulcer of left buttock, stage 4: Secondary | ICD-10-CM | POA: Diagnosis not present

## 2023-07-18 DIAGNOSIS — L89616 Pressure-induced deep tissue damage of right heel: Secondary | ICD-10-CM | POA: Diagnosis not present

## 2023-07-18 DIAGNOSIS — M4628 Osteomyelitis of vertebra, sacral and sacrococcygeal region: Secondary | ICD-10-CM | POA: Diagnosis not present

## 2023-07-18 DIAGNOSIS — L89154 Pressure ulcer of sacral region, stage 4: Secondary | ICD-10-CM | POA: Diagnosis not present

## 2023-07-18 DIAGNOSIS — L89626 Pressure-induced deep tissue damage of left heel: Secondary | ICD-10-CM | POA: Diagnosis not present

## 2023-07-18 DIAGNOSIS — S80212D Abrasion, left knee, subsequent encounter: Secondary | ICD-10-CM | POA: Diagnosis not present

## 2023-07-19 DIAGNOSIS — L89626 Pressure-induced deep tissue damage of left heel: Secondary | ICD-10-CM | POA: Diagnosis not present

## 2023-07-19 DIAGNOSIS — L89324 Pressure ulcer of left buttock, stage 4: Secondary | ICD-10-CM | POA: Diagnosis not present

## 2023-07-19 DIAGNOSIS — S80212D Abrasion, left knee, subsequent encounter: Secondary | ICD-10-CM | POA: Diagnosis not present

## 2023-07-19 DIAGNOSIS — N319 Neuromuscular dysfunction of bladder, unspecified: Secondary | ICD-10-CM | POA: Diagnosis not present

## 2023-07-19 DIAGNOSIS — L89154 Pressure ulcer of sacral region, stage 4: Secondary | ICD-10-CM | POA: Diagnosis not present

## 2023-07-19 DIAGNOSIS — M4628 Osteomyelitis of vertebra, sacral and sacrococcygeal region: Secondary | ICD-10-CM | POA: Diagnosis not present

## 2023-07-19 DIAGNOSIS — Z466 Encounter for fitting and adjustment of urinary device: Secondary | ICD-10-CM | POA: Diagnosis not present

## 2023-07-19 DIAGNOSIS — L89616 Pressure-induced deep tissue damage of right heel: Secondary | ICD-10-CM | POA: Diagnosis not present

## 2023-07-19 DIAGNOSIS — Z433 Encounter for attention to colostomy: Secondary | ICD-10-CM | POA: Diagnosis not present

## 2023-07-22 DIAGNOSIS — L89154 Pressure ulcer of sacral region, stage 4: Secondary | ICD-10-CM | POA: Diagnosis not present

## 2023-07-22 DIAGNOSIS — Z466 Encounter for fitting and adjustment of urinary device: Secondary | ICD-10-CM | POA: Diagnosis not present

## 2023-07-22 DIAGNOSIS — M4628 Osteomyelitis of vertebra, sacral and sacrococcygeal region: Secondary | ICD-10-CM | POA: Diagnosis not present

## 2023-07-22 DIAGNOSIS — S80212D Abrasion, left knee, subsequent encounter: Secondary | ICD-10-CM | POA: Diagnosis not present

## 2023-07-22 DIAGNOSIS — L89626 Pressure-induced deep tissue damage of left heel: Secondary | ICD-10-CM | POA: Diagnosis not present

## 2023-07-22 DIAGNOSIS — L89616 Pressure-induced deep tissue damage of right heel: Secondary | ICD-10-CM | POA: Diagnosis not present

## 2023-07-22 DIAGNOSIS — L89324 Pressure ulcer of left buttock, stage 4: Secondary | ICD-10-CM | POA: Diagnosis not present

## 2023-07-22 DIAGNOSIS — N319 Neuromuscular dysfunction of bladder, unspecified: Secondary | ICD-10-CM | POA: Diagnosis not present

## 2023-07-22 DIAGNOSIS — Z433 Encounter for attention to colostomy: Secondary | ICD-10-CM | POA: Diagnosis not present

## 2023-07-24 DIAGNOSIS — L89154 Pressure ulcer of sacral region, stage 4: Secondary | ICD-10-CM | POA: Diagnosis not present

## 2023-07-24 DIAGNOSIS — L89616 Pressure-induced deep tissue damage of right heel: Secondary | ICD-10-CM | POA: Diagnosis not present

## 2023-07-24 DIAGNOSIS — L89324 Pressure ulcer of left buttock, stage 4: Secondary | ICD-10-CM | POA: Diagnosis not present

## 2023-07-24 DIAGNOSIS — Z466 Encounter for fitting and adjustment of urinary device: Secondary | ICD-10-CM | POA: Diagnosis not present

## 2023-07-24 DIAGNOSIS — Z433 Encounter for attention to colostomy: Secondary | ICD-10-CM | POA: Diagnosis not present

## 2023-07-24 DIAGNOSIS — S80212D Abrasion, left knee, subsequent encounter: Secondary | ICD-10-CM | POA: Diagnosis not present

## 2023-07-24 DIAGNOSIS — N319 Neuromuscular dysfunction of bladder, unspecified: Secondary | ICD-10-CM | POA: Diagnosis not present

## 2023-07-24 DIAGNOSIS — L89626 Pressure-induced deep tissue damage of left heel: Secondary | ICD-10-CM | POA: Diagnosis not present

## 2023-07-24 DIAGNOSIS — M4628 Osteomyelitis of vertebra, sacral and sacrococcygeal region: Secondary | ICD-10-CM | POA: Diagnosis not present

## 2023-07-25 DIAGNOSIS — L98429 Non-pressure chronic ulcer of back with unspecified severity: Secondary | ICD-10-CM | POA: Diagnosis not present

## 2023-07-25 DIAGNOSIS — L89329 Pressure ulcer of left buttock, unspecified stage: Secondary | ICD-10-CM | POA: Diagnosis not present

## 2023-07-28 DIAGNOSIS — L89626 Pressure-induced deep tissue damage of left heel: Secondary | ICD-10-CM | POA: Diagnosis not present

## 2023-07-28 DIAGNOSIS — Z466 Encounter for fitting and adjustment of urinary device: Secondary | ICD-10-CM | POA: Diagnosis not present

## 2023-07-28 DIAGNOSIS — S80212D Abrasion, left knee, subsequent encounter: Secondary | ICD-10-CM | POA: Diagnosis not present

## 2023-07-28 DIAGNOSIS — L89154 Pressure ulcer of sacral region, stage 4: Secondary | ICD-10-CM | POA: Diagnosis not present

## 2023-07-28 DIAGNOSIS — N319 Neuromuscular dysfunction of bladder, unspecified: Secondary | ICD-10-CM | POA: Diagnosis not present

## 2023-07-28 DIAGNOSIS — L89324 Pressure ulcer of left buttock, stage 4: Secondary | ICD-10-CM | POA: Diagnosis not present

## 2023-07-28 DIAGNOSIS — L89616 Pressure-induced deep tissue damage of right heel: Secondary | ICD-10-CM | POA: Diagnosis not present

## 2023-07-28 DIAGNOSIS — Z433 Encounter for attention to colostomy: Secondary | ICD-10-CM | POA: Diagnosis not present

## 2023-07-28 DIAGNOSIS — M4628 Osteomyelitis of vertebra, sacral and sacrococcygeal region: Secondary | ICD-10-CM | POA: Diagnosis not present

## 2023-07-31 DIAGNOSIS — L89324 Pressure ulcer of left buttock, stage 4: Secondary | ICD-10-CM | POA: Diagnosis not present

## 2023-07-31 DIAGNOSIS — L89626 Pressure-induced deep tissue damage of left heel: Secondary | ICD-10-CM | POA: Diagnosis not present

## 2023-07-31 DIAGNOSIS — Z433 Encounter for attention to colostomy: Secondary | ICD-10-CM | POA: Diagnosis not present

## 2023-07-31 DIAGNOSIS — L89154 Pressure ulcer of sacral region, stage 4: Secondary | ICD-10-CM | POA: Diagnosis not present

## 2023-07-31 DIAGNOSIS — L89616 Pressure-induced deep tissue damage of right heel: Secondary | ICD-10-CM | POA: Diagnosis not present

## 2023-07-31 DIAGNOSIS — Z466 Encounter for fitting and adjustment of urinary device: Secondary | ICD-10-CM | POA: Diagnosis not present

## 2023-07-31 DIAGNOSIS — N319 Neuromuscular dysfunction of bladder, unspecified: Secondary | ICD-10-CM | POA: Diagnosis not present

## 2023-07-31 DIAGNOSIS — M4628 Osteomyelitis of vertebra, sacral and sacrococcygeal region: Secondary | ICD-10-CM | POA: Diagnosis not present

## 2023-07-31 DIAGNOSIS — S80212D Abrasion, left knee, subsequent encounter: Secondary | ICD-10-CM | POA: Diagnosis not present
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
## 2023-08-02 DIAGNOSIS — L89154 Pressure ulcer of sacral region, stage 4: Secondary | ICD-10-CM | POA: Diagnosis not present

## 2023-08-02 DIAGNOSIS — L89616 Pressure-induced deep tissue damage of right heel: Secondary | ICD-10-CM | POA: Diagnosis not present

## 2023-08-02 DIAGNOSIS — Z433 Encounter for attention to colostomy: Secondary | ICD-10-CM | POA: Diagnosis not present

## 2023-08-02 DIAGNOSIS — M4628 Osteomyelitis of vertebra, sacral and sacrococcygeal region: Secondary | ICD-10-CM | POA: Diagnosis not present

## 2023-08-02 DIAGNOSIS — Z466 Encounter for fitting and adjustment of urinary device: Secondary | ICD-10-CM | POA: Diagnosis not present

## 2023-08-02 DIAGNOSIS — S80212D Abrasion, left knee, subsequent encounter: Secondary | ICD-10-CM | POA: Diagnosis not present

## 2023-08-02 DIAGNOSIS — N319 Neuromuscular dysfunction of bladder, unspecified: Secondary | ICD-10-CM | POA: Diagnosis not present

## 2023-08-02 DIAGNOSIS — L89626 Pressure-induced deep tissue damage of left heel: Secondary | ICD-10-CM | POA: Diagnosis not present

## 2023-08-02 DIAGNOSIS — L89324 Pressure ulcer of left buttock, stage 4: Secondary | ICD-10-CM | POA: Diagnosis not present

## 2023-08-03 ENCOUNTER — Encounter: Payer: Self-pay | Admitting: Urology

## 2023-08-03 ENCOUNTER — Ambulatory Visit: Payer: Medicare HMO | Admitting: Urology

## 2023-08-03 VITALS — BP 115/50 | HR 76

## 2023-08-03 DIAGNOSIS — R319 Hematuria, unspecified: Secondary | ICD-10-CM

## 2023-08-03 DIAGNOSIS — N319 Neuromuscular dysfunction of bladder, unspecified: Secondary | ICD-10-CM

## 2023-08-04 ENCOUNTER — Telehealth: Payer: Self-pay | Admitting: Urology

## 2023-08-04 DIAGNOSIS — S80212D Abrasion, left knee, subsequent encounter: Secondary | ICD-10-CM | POA: Diagnosis not present

## 2023-08-04 DIAGNOSIS — N319 Neuromuscular dysfunction of bladder, unspecified: Secondary | ICD-10-CM | POA: Diagnosis not present

## 2023-08-04 DIAGNOSIS — Z466 Encounter for fitting and adjustment of urinary device: Secondary | ICD-10-CM | POA: Diagnosis not present

## 2023-08-04 DIAGNOSIS — L89154 Pressure ulcer of sacral region, stage 4: Secondary | ICD-10-CM | POA: Diagnosis not present

## 2023-08-04 DIAGNOSIS — M4628 Osteomyelitis of vertebra, sacral and sacrococcygeal region: Secondary | ICD-10-CM | POA: Diagnosis not present

## 2023-08-04 DIAGNOSIS — L89324 Pressure ulcer of left buttock, stage 4: Secondary | ICD-10-CM | POA: Diagnosis not present

## 2023-08-04 DIAGNOSIS — L89626 Pressure-induced deep tissue damage of left heel: Secondary | ICD-10-CM | POA: Diagnosis not present

## 2023-08-04 DIAGNOSIS — Z433 Encounter for attention to colostomy: Secondary | ICD-10-CM | POA: Diagnosis not present

## 2023-08-04 DIAGNOSIS — L89616 Pressure-induced deep tissue damage of right heel: Secondary | ICD-10-CM | POA: Diagnosis not present

## 2023-08-04 NOTE — Telephone Encounter (Signed)
 Would you call Bradley [name] and confirm with him the size of his condom catheters?  I think he told me 36, but [employer] doesn't offer a size 36.  If it is a 75, where was he getting them and I will probably need to fax over a script to that supplier.

## 2023-08-04 NOTE — Telephone Encounter (Signed)
 Pt confirmed that 36 is the correct size for condom cath.   Previously he was getting them via home health.   He would like a rx sent to [facility] [place].   Can fax to [phone]  Per clover need to send face sheet, last OV note, and rx.   If you put the rx in and send back to me I will fax. Thanks.

## 2023-08-07 DIAGNOSIS — M4628 Osteomyelitis of vertebra, sacral and sacrococcygeal region: Secondary | ICD-10-CM | POA: Diagnosis not present

## 2023-08-07 DIAGNOSIS — L89626 Pressure-induced deep tissue damage of left heel: Secondary | ICD-10-CM | POA: Diagnosis not present

## 2023-08-07 DIAGNOSIS — L89616 Pressure-induced deep tissue damage of right heel: Secondary | ICD-10-CM | POA: Diagnosis not present

## 2023-08-07 DIAGNOSIS — L89324 Pressure ulcer of left buttock, stage 4: Secondary | ICD-10-CM | POA: Diagnosis not present

## 2023-08-07 DIAGNOSIS — N319 Neuromuscular dysfunction of bladder, unspecified: Secondary | ICD-10-CM | POA: Diagnosis not present

## 2023-08-07 DIAGNOSIS — M86172 Other acute osteomyelitis, left ankle and foot: Secondary | ICD-10-CM | POA: Insufficient documentation

## 2023-08-07 DIAGNOSIS — L89154 Pressure ulcer of sacral region, stage 4: Secondary | ICD-10-CM | POA: Diagnosis not present

## 2023-08-07 DIAGNOSIS — Z466 Encounter for fitting and adjustment of urinary device: Secondary | ICD-10-CM | POA: Diagnosis not present

## 2023-08-07 DIAGNOSIS — S80212D Abrasion, left knee, subsequent encounter: Secondary | ICD-10-CM | POA: Diagnosis not present

## 2023-08-07 DIAGNOSIS — Z433 Encounter for attention to colostomy: Secondary | ICD-10-CM | POA: Diagnosis not present

## 2023-08-08 ENCOUNTER — Other Ambulatory Visit: Payer: Self-pay | Admitting: Urology

## 2023-08-08 MED ORDER — AMBULATORY NON FORMULARY MEDICATION: 12 refills | Status: AC

## 2023-08-08 NOTE — Telephone Encounter (Signed)
Documents faxed  Confirmation received

## 2023-08-09 DIAGNOSIS — L89616 Pressure-induced deep tissue damage of right heel: Secondary | ICD-10-CM | POA: Diagnosis not present

## 2023-08-09 DIAGNOSIS — N319 Neuromuscular dysfunction of bladder, unspecified: Secondary | ICD-10-CM | POA: Diagnosis not present

## 2023-08-09 DIAGNOSIS — Z466 Encounter for fitting and adjustment of urinary device: Secondary | ICD-10-CM | POA: Diagnosis not present

## 2023-08-09 DIAGNOSIS — L89154 Pressure ulcer of sacral region, stage 4: Secondary | ICD-10-CM | POA: Diagnosis not present

## 2023-08-09 DIAGNOSIS — Z433 Encounter for attention to colostomy: Secondary | ICD-10-CM | POA: Diagnosis not present

## 2023-08-09 DIAGNOSIS — L89324 Pressure ulcer of left buttock, stage 4: Secondary | ICD-10-CM | POA: Diagnosis not present

## 2023-08-09 DIAGNOSIS — S80212D Abrasion, left knee, subsequent encounter: Secondary | ICD-10-CM | POA: Diagnosis not present

## 2023-08-09 DIAGNOSIS — L89626 Pressure-induced deep tissue damage of left heel: Secondary | ICD-10-CM | POA: Diagnosis not present

## 2023-08-09 DIAGNOSIS — M4628 Osteomyelitis of vertebra, sacral and sacrococcygeal region: Secondary | ICD-10-CM | POA: Diagnosis not present

## 2023-08-10 DIAGNOSIS — L97424 Non-pressure chronic ulcer of left heel and midfoot with necrosis of bone: Secondary | ICD-10-CM | POA: Diagnosis not present

## 2023-08-10 DIAGNOSIS — Z1630 Resistance to unspecified antimicrobial drugs: Secondary | ICD-10-CM | POA: Diagnosis not present

## 2023-08-10 DIAGNOSIS — S88012A Complete traumatic amputation at knee level, left lower leg, initial encounter: Secondary | ICD-10-CM | POA: Diagnosis not present

## 2023-08-10 DIAGNOSIS — B964 Proteus (mirabilis) (morganii) as the cause of diseases classified elsewhere: Secondary | ICD-10-CM | POA: Diagnosis not present

## 2023-08-10 DIAGNOSIS — I10 Essential (primary) hypertension: Secondary | ICD-10-CM | POA: Diagnosis not present

## 2023-08-10 DIAGNOSIS — L89329 Pressure ulcer of left buttock, unspecified stage: Secondary | ICD-10-CM | POA: Diagnosis not present

## 2023-08-10 DIAGNOSIS — B957 Other staphylococcus as the cause of diseases classified elsewhere: Secondary | ICD-10-CM | POA: Diagnosis not present

## 2023-08-10 DIAGNOSIS — L89229 Pressure ulcer of left hip, unspecified stage: Secondary | ICD-10-CM | POA: Diagnosis not present

## 2023-08-10 DIAGNOSIS — L97519 Non-pressure chronic ulcer of other part of right foot with unspecified severity: Secondary | ICD-10-CM | POA: Diagnosis not present

## 2023-08-10 DIAGNOSIS — L97419 Non-pressure chronic ulcer of right heel and midfoot with unspecified severity: Secondary | ICD-10-CM | POA: Diagnosis not present

## 2023-08-10 DIAGNOSIS — B965 Pseudomonas (aeruginosa) (mallei) (pseudomallei) as the cause of diseases classified elsewhere: Secondary | ICD-10-CM | POA: Diagnosis not present

## 2023-08-10 DIAGNOSIS — L8940 Pressure ulcer of contiguous site of back, buttock and hip, unspecified stage: Secondary | ICD-10-CM | POA: Diagnosis not present

## 2023-08-10 DIAGNOSIS — M85871 Other specified disorders of bone density and structure, right ankle and foot: Secondary | ICD-10-CM | POA: Diagnosis not present

## 2023-08-10 DIAGNOSIS — B954 Other streptococcus as the cause of diseases classified elsewhere: Secondary | ICD-10-CM | POA: Diagnosis not present

## 2023-08-10 DIAGNOSIS — E876 Hypokalemia: Secondary | ICD-10-CM | POA: Diagnosis not present

## 2023-08-10 DIAGNOSIS — L089 Local infection of the skin and subcutaneous tissue, unspecified: Secondary | ICD-10-CM | POA: Diagnosis not present

## 2023-08-10 DIAGNOSIS — M86652 Other chronic osteomyelitis, left thigh: Secondary | ICD-10-CM | POA: Diagnosis not present

## 2023-08-10 DIAGNOSIS — M86172 Other acute osteomyelitis, left ankle and foot: Secondary | ICD-10-CM | POA: Diagnosis not present

## 2023-08-10 DIAGNOSIS — B9689 Other specified bacterial agents as the cause of diseases classified elsewhere: Secondary | ICD-10-CM | POA: Diagnosis not present

## 2023-08-10 DIAGNOSIS — R7881 Bacteremia: Secondary | ICD-10-CM | POA: Diagnosis not present

## 2023-08-10 DIAGNOSIS — L89154 Pressure ulcer of sacral region, stage 4: Secondary | ICD-10-CM | POA: Diagnosis not present

## 2023-08-10 DIAGNOSIS — N390 Urinary tract infection, site not specified: Secondary | ICD-10-CM | POA: Diagnosis not present

## 2023-08-10 DIAGNOSIS — L89629 Pressure ulcer of left heel, unspecified stage: Secondary | ICD-10-CM | POA: Diagnosis not present

## 2023-08-10 DIAGNOSIS — S78112A Complete traumatic amputation at level between left hip and knee, initial encounter: Secondary | ICD-10-CM | POA: Diagnosis not present

## 2023-08-10 DIAGNOSIS — S91001A Unspecified open wound, right ankle, initial encounter: Secondary | ICD-10-CM | POA: Diagnosis not present

## 2023-08-10 DIAGNOSIS — R590 Localized enlarged lymph nodes: Secondary | ICD-10-CM | POA: Diagnosis not present

## 2023-08-10 DIAGNOSIS — A488 Other specified bacterial diseases: Secondary | ICD-10-CM | POA: Diagnosis not present

## 2023-08-10 DIAGNOSIS — E46 Unspecified protein-calorie malnutrition: Secondary | ICD-10-CM | POA: Diagnosis not present

## 2023-08-10 DIAGNOSIS — B9620 Unspecified Escherichia coli [E. coli] as the cause of diseases classified elsewhere: Secondary | ICD-10-CM | POA: Diagnosis not present

## 2023-08-10 DIAGNOSIS — T8781 Dehiscence of amputation stump: Secondary | ICD-10-CM | POA: Diagnosis not present

## 2023-08-10 DIAGNOSIS — I739 Peripheral vascular disease, unspecified: Secondary | ICD-10-CM | POA: Diagnosis not present

## 2023-08-10 DIAGNOSIS — L89624 Pressure ulcer of left heel, stage 4: Secondary | ICD-10-CM | POA: Diagnosis not present

## 2023-08-10 DIAGNOSIS — A499 Bacterial infection, unspecified: Secondary | ICD-10-CM | POA: Diagnosis not present

## 2023-08-10 DIAGNOSIS — N319 Neuromuscular dysfunction of bladder, unspecified: Secondary | ICD-10-CM | POA: Diagnosis not present

## 2023-08-10 DIAGNOSIS — N3289 Other specified disorders of bladder: Secondary | ICD-10-CM | POA: Diagnosis not present

## 2023-08-10 DIAGNOSIS — B9562 Methicillin resistant Staphylococcus aureus infection as the cause of diseases classified elsewhere: Secondary | ICD-10-CM | POA: Diagnosis not present

## 2023-08-10 DIAGNOSIS — L97314 Non-pressure chronic ulcer of right ankle with necrosis of bone: Secondary | ICD-10-CM | POA: Diagnosis not present

## 2023-08-10 DIAGNOSIS — M4628 Osteomyelitis of vertebra, sacral and sacrococcygeal region: Secondary | ICD-10-CM | POA: Diagnosis not present

## 2023-08-10 DIAGNOSIS — L89514 Pressure ulcer of right ankle, stage 4: Secondary | ICD-10-CM | POA: Diagnosis not present

## 2023-08-10 DIAGNOSIS — R5381 Other malaise: Secondary | ICD-10-CM | POA: Diagnosis not present

## 2023-08-10 DIAGNOSIS — L97412 Non-pressure chronic ulcer of right heel and midfoot with fat layer exposed: Secondary | ICD-10-CM | POA: Diagnosis not present

## 2023-08-10 DIAGNOSIS — L97414 Non-pressure chronic ulcer of right heel and midfoot with necrosis of bone: Secondary | ICD-10-CM | POA: Diagnosis not present

## 2023-08-10 DIAGNOSIS — M87875 Other osteonecrosis, left foot: Secondary | ICD-10-CM | POA: Diagnosis not present

## 2023-08-10 DIAGNOSIS — M86672 Other chronic osteomyelitis, left ankle and foot: Secondary | ICD-10-CM | POA: Diagnosis not present

## 2023-08-10 DIAGNOSIS — M6281 Muscle weakness (generalized): Secondary | ICD-10-CM | POA: Diagnosis not present

## 2023-08-10 DIAGNOSIS — L89219 Pressure ulcer of right hip, unspecified stage: Secondary | ICD-10-CM | POA: Diagnosis not present

## 2023-08-10 DIAGNOSIS — L97511 Non-pressure chronic ulcer of other part of right foot limited to breakdown of skin: Secondary | ICD-10-CM | POA: Diagnosis not present

## 2023-08-10 DIAGNOSIS — T8789 Other complications of amputation stump: Secondary | ICD-10-CM | POA: Diagnosis not present

## 2023-08-10 DIAGNOSIS — L89324 Pressure ulcer of left buttock, stage 4: Secondary | ICD-10-CM | POA: Diagnosis not present

## 2023-08-10 DIAGNOSIS — M869 Osteomyelitis, unspecified: Secondary | ICD-10-CM | POA: Diagnosis not present

## 2023-08-10 DIAGNOSIS — L89893 Pressure ulcer of other site, stage 3: Secondary | ICD-10-CM | POA: Diagnosis not present

## 2023-08-10 DIAGNOSIS — B9561 Methicillin susceptible Staphylococcus aureus infection as the cause of diseases classified elsewhere: Secondary | ICD-10-CM | POA: Diagnosis not present

## 2023-08-10 DIAGNOSIS — G8220 Paraplegia, unspecified: Secondary | ICD-10-CM | POA: Diagnosis not present

## 2023-08-10 DIAGNOSIS — Z1624 Resistance to multiple antibiotics: Secondary | ICD-10-CM | POA: Diagnosis not present

## 2023-08-10 DIAGNOSIS — R279 Unspecified lack of coordination: Secondary | ICD-10-CM | POA: Diagnosis not present

## 2023-08-10 DIAGNOSIS — A498 Other bacterial infections of unspecified site: Secondary | ICD-10-CM | POA: Diagnosis not present

## 2023-08-10 DIAGNOSIS — M86171 Other acute osteomyelitis, right ankle and foot: Secondary | ICD-10-CM | POA: Diagnosis not present

## 2023-08-10 DIAGNOSIS — L89319 Pressure ulcer of right buttock, unspecified stage: Secondary | ICD-10-CM | POA: Diagnosis not present

## 2023-08-10 DIAGNOSIS — L89613 Pressure ulcer of right heel, stage 3: Secondary | ICD-10-CM | POA: Diagnosis not present

## 2023-08-10 DIAGNOSIS — S91301A Unspecified open wound, right foot, initial encounter: Secondary | ICD-10-CM | POA: Diagnosis not present

## 2023-08-10 DIAGNOSIS — B9683 Acinetobacter baumannii as the cause of diseases classified elsewhere: Secondary | ICD-10-CM | POA: Diagnosis not present

## 2023-08-10 DIAGNOSIS — I70234 Atherosclerosis of native arteries of right leg with ulceration of heel and midfoot: Secondary | ICD-10-CM | POA: Diagnosis not present

## 2023-08-10 DIAGNOSIS — S91302A Unspecified open wound, left foot, initial encounter: Secondary | ICD-10-CM | POA: Diagnosis not present

## 2023-08-10 DIAGNOSIS — L97904 Non-pressure chronic ulcer of unspecified part of unspecified lower leg with necrosis of bone: Secondary | ICD-10-CM | POA: Diagnosis not present

## 2023-08-10 DIAGNOSIS — L89159 Pressure ulcer of sacral region, unspecified stage: Secondary | ICD-10-CM | POA: Diagnosis not present

## 2023-08-10 DIAGNOSIS — L89619 Pressure ulcer of right heel, unspecified stage: Secondary | ICD-10-CM | POA: Diagnosis not present

## 2023-08-10 DIAGNOSIS — M19071 Primary osteoarthritis, right ankle and foot: Secondary | ICD-10-CM | POA: Diagnosis not present

## 2023-08-10 DIAGNOSIS — L97914 Non-pressure chronic ulcer of unspecified part of right lower leg with necrosis of bone: Secondary | ICD-10-CM | POA: Diagnosis not present

## 2023-08-10 DIAGNOSIS — Z1635 Resistance to multiple antimicrobial drugs: Secondary | ICD-10-CM | POA: Diagnosis not present

## 2023-08-11 DIAGNOSIS — L89159 Pressure ulcer of sacral region, unspecified stage: Secondary | ICD-10-CM | POA: Diagnosis not present

## 2023-08-11 DIAGNOSIS — B9620 Unspecified Escherichia coli [E. coli] as the cause of diseases classified elsewhere: Secondary | ICD-10-CM | POA: Diagnosis not present

## 2023-08-11 DIAGNOSIS — L89619 Pressure ulcer of right heel, unspecified stage: Secondary | ICD-10-CM | POA: Diagnosis not present

## 2023-08-11 DIAGNOSIS — L8940 Pressure ulcer of contiguous site of back, buttock and hip, unspecified stage: Secondary | ICD-10-CM | POA: Diagnosis not present

## 2023-08-11 DIAGNOSIS — B965 Pseudomonas (aeruginosa) (mallei) (pseudomallei) as the cause of diseases classified elsewhere: Secondary | ICD-10-CM | POA: Diagnosis not present

## 2023-08-11 DIAGNOSIS — R590 Localized enlarged lymph nodes: Secondary | ICD-10-CM | POA: Diagnosis not present

## 2023-08-11 DIAGNOSIS — B9561 Methicillin susceptible Staphylococcus aureus infection as the cause of diseases classified elsewhere: Secondary | ICD-10-CM | POA: Diagnosis not present

## 2023-08-11 DIAGNOSIS — B954 Other streptococcus as the cause of diseases classified elsewhere: Secondary | ICD-10-CM | POA: Diagnosis not present

## 2023-08-11 DIAGNOSIS — N3289 Other specified disorders of bladder: Secondary | ICD-10-CM | POA: Diagnosis not present

## 2023-08-11 DIAGNOSIS — L89629 Pressure ulcer of left heel, unspecified stage: Secondary | ICD-10-CM | POA: Diagnosis not present

## 2023-08-12 DIAGNOSIS — B954 Other streptococcus as the cause of diseases classified elsewhere: Secondary | ICD-10-CM | POA: Diagnosis not present

## 2023-08-12 DIAGNOSIS — B965 Pseudomonas (aeruginosa) (mallei) (pseudomallei) as the cause of diseases classified elsewhere: Secondary | ICD-10-CM | POA: Diagnosis not present

## 2023-08-12 DIAGNOSIS — L89159 Pressure ulcer of sacral region, unspecified stage: Secondary | ICD-10-CM | POA: Diagnosis not present

## 2023-08-12 DIAGNOSIS — B9562 Methicillin resistant Staphylococcus aureus infection as the cause of diseases classified elsewhere: Secondary | ICD-10-CM | POA: Diagnosis not present

## 2023-08-12 DIAGNOSIS — L89229 Pressure ulcer of left hip, unspecified stage: Secondary | ICD-10-CM | POA: Diagnosis not present

## 2023-08-13 DIAGNOSIS — B9562 Methicillin resistant Staphylococcus aureus infection as the cause of diseases classified elsewhere: Secondary | ICD-10-CM | POA: Diagnosis not present

## 2023-08-13 DIAGNOSIS — B954 Other streptococcus as the cause of diseases classified elsewhere: Secondary | ICD-10-CM | POA: Diagnosis not present

## 2023-08-13 DIAGNOSIS — B965 Pseudomonas (aeruginosa) (mallei) (pseudomallei) as the cause of diseases classified elsewhere: Secondary | ICD-10-CM | POA: Diagnosis not present

## 2023-08-13 DIAGNOSIS — L89159 Pressure ulcer of sacral region, unspecified stage: Secondary | ICD-10-CM | POA: Diagnosis not present

## 2023-08-13 DIAGNOSIS — L89229 Pressure ulcer of left hip, unspecified stage: Secondary | ICD-10-CM | POA: Diagnosis not present

## 2023-08-14 DIAGNOSIS — L97412 Non-pressure chronic ulcer of right heel and midfoot with fat layer exposed: Secondary | ICD-10-CM | POA: Diagnosis not present

## 2023-08-14 DIAGNOSIS — L89154 Pressure ulcer of sacral region, stage 4: Secondary | ICD-10-CM | POA: Diagnosis not present

## 2023-08-14 DIAGNOSIS — A498 Other bacterial infections of unspecified site: Secondary | ICD-10-CM | POA: Diagnosis not present

## 2023-08-14 DIAGNOSIS — G8220 Paraplegia, unspecified: Secondary | ICD-10-CM | POA: Diagnosis not present

## 2023-08-14 DIAGNOSIS — M4628 Osteomyelitis of vertebra, sacral and sacrococcygeal region: Secondary | ICD-10-CM | POA: Diagnosis not present

## 2023-08-14 DIAGNOSIS — L89319 Pressure ulcer of right buttock, unspecified stage: Secondary | ICD-10-CM | POA: Diagnosis not present

## 2023-08-14 DIAGNOSIS — L97314 Non-pressure chronic ulcer of right ankle with necrosis of bone: Secondary | ICD-10-CM | POA: Diagnosis not present

## 2023-08-14 DIAGNOSIS — L97424 Non-pressure chronic ulcer of left heel and midfoot with necrosis of bone: Secondary | ICD-10-CM | POA: Diagnosis not present

## 2023-08-15 DIAGNOSIS — A498 Other bacterial infections of unspecified site: Secondary | ICD-10-CM | POA: Diagnosis not present

## 2023-08-15 DIAGNOSIS — S91001A Unspecified open wound, right ankle, initial encounter: Secondary | ICD-10-CM | POA: Diagnosis not present

## 2023-08-15 DIAGNOSIS — M4628 Osteomyelitis of vertebra, sacral and sacrococcygeal region: Secondary | ICD-10-CM | POA: Diagnosis not present

## 2023-08-15 DIAGNOSIS — M869 Osteomyelitis, unspecified: Secondary | ICD-10-CM | POA: Diagnosis not present

## 2023-08-15 DIAGNOSIS — S91302A Unspecified open wound, left foot, initial encounter: Secondary | ICD-10-CM | POA: Diagnosis not present

## 2023-08-15 DIAGNOSIS — L97904 Non-pressure chronic ulcer of unspecified part of unspecified lower leg with necrosis of bone: Secondary | ICD-10-CM | POA: Diagnosis not present

## 2023-08-15 DIAGNOSIS — L89159 Pressure ulcer of sacral region, unspecified stage: Secondary | ICD-10-CM | POA: Diagnosis not present

## 2023-08-16 DIAGNOSIS — L97904 Non-pressure chronic ulcer of unspecified part of unspecified lower leg with necrosis of bone: Secondary | ICD-10-CM | POA: Diagnosis not present

## 2023-08-16 DIAGNOSIS — A498 Other bacterial infections of unspecified site: Secondary | ICD-10-CM | POA: Diagnosis not present

## 2023-08-16 DIAGNOSIS — M4628 Osteomyelitis of vertebra, sacral and sacrococcygeal region: Secondary | ICD-10-CM | POA: Diagnosis not present

## 2023-08-16 DIAGNOSIS — L89159 Pressure ulcer of sacral region, unspecified stage: Secondary | ICD-10-CM | POA: Diagnosis not present

## 2023-08-17 DIAGNOSIS — A498 Other bacterial infections of unspecified site: Secondary | ICD-10-CM | POA: Diagnosis not present

## 2023-08-17 DIAGNOSIS — L89159 Pressure ulcer of sacral region, unspecified stage: Secondary | ICD-10-CM | POA: Diagnosis not present

## 2023-08-17 DIAGNOSIS — M4628 Osteomyelitis of vertebra, sacral and sacrococcygeal region: Secondary | ICD-10-CM | POA: Diagnosis not present

## 2023-08-17 DIAGNOSIS — L97914 Non-pressure chronic ulcer of unspecified part of right lower leg with necrosis of bone: Secondary | ICD-10-CM | POA: Diagnosis not present

## 2023-08-18 DIAGNOSIS — A498 Other bacterial infections of unspecified site: Secondary | ICD-10-CM | POA: Diagnosis not present

## 2023-08-18 DIAGNOSIS — M4628 Osteomyelitis of vertebra, sacral and sacrococcygeal region: Secondary | ICD-10-CM | POA: Diagnosis not present

## 2023-08-18 DIAGNOSIS — L89159 Pressure ulcer of sacral region, unspecified stage: Secondary | ICD-10-CM | POA: Diagnosis not present

## 2023-08-18 DIAGNOSIS — L97914 Non-pressure chronic ulcer of unspecified part of right lower leg with necrosis of bone: Secondary | ICD-10-CM | POA: Diagnosis not present

## 2023-08-19 DIAGNOSIS — Z1624 Resistance to multiple antibiotics: Secondary | ICD-10-CM | POA: Diagnosis not present

## 2023-08-19 DIAGNOSIS — A498 Other bacterial infections of unspecified site: Secondary | ICD-10-CM | POA: Diagnosis not present

## 2023-08-19 DIAGNOSIS — L97314 Non-pressure chronic ulcer of right ankle with necrosis of bone: Secondary | ICD-10-CM | POA: Diagnosis not present

## 2023-08-19 DIAGNOSIS — M4628 Osteomyelitis of vertebra, sacral and sacrococcygeal region: Secondary | ICD-10-CM | POA: Diagnosis not present

## 2023-08-20 DIAGNOSIS — I10 Essential (primary) hypertension: Secondary | ICD-10-CM | POA: Diagnosis not present

## 2023-08-20 DIAGNOSIS — B9620 Unspecified Escherichia coli [E. coli] as the cause of diseases classified elsewhere: Secondary | ICD-10-CM | POA: Diagnosis not present

## 2023-08-20 DIAGNOSIS — M86652 Other chronic osteomyelitis, left thigh: Secondary | ICD-10-CM | POA: Diagnosis not present

## 2023-08-20 DIAGNOSIS — B9689 Other specified bacterial agents as the cause of diseases classified elsewhere: Secondary | ICD-10-CM | POA: Diagnosis not present

## 2023-08-20 DIAGNOSIS — B954 Other streptococcus as the cause of diseases classified elsewhere: Secondary | ICD-10-CM | POA: Diagnosis not present

## 2023-08-20 DIAGNOSIS — L89329 Pressure ulcer of left buttock, unspecified stage: Secondary | ICD-10-CM | POA: Diagnosis not present

## 2023-08-20 DIAGNOSIS — B965 Pseudomonas (aeruginosa) (mallei) (pseudomallei) as the cause of diseases classified elsewhere: Secondary | ICD-10-CM | POA: Diagnosis not present

## 2023-08-20 DIAGNOSIS — E876 Hypokalemia: Secondary | ICD-10-CM | POA: Diagnosis not present

## 2023-08-20 DIAGNOSIS — B9683 Acinetobacter baumannii as the cause of diseases classified elsewhere: Secondary | ICD-10-CM | POA: Diagnosis not present

## 2023-08-21 ENCOUNTER — Ambulatory Visit: Payer: Medicare HMO | Admitting: Physician Assistant

## 2023-08-21 DIAGNOSIS — S91001A Unspecified open wound, right ankle, initial encounter: Secondary | ICD-10-CM | POA: Diagnosis not present

## 2023-08-21 DIAGNOSIS — B9683 Acinetobacter baumannii as the cause of diseases classified elsewhere: Secondary | ICD-10-CM | POA: Diagnosis not present

## 2023-08-21 DIAGNOSIS — L89624 Pressure ulcer of left heel, stage 4: Secondary | ICD-10-CM | POA: Diagnosis not present

## 2023-08-21 DIAGNOSIS — S91302A Unspecified open wound, left foot, initial encounter: Secondary | ICD-10-CM | POA: Diagnosis not present

## 2023-08-21 DIAGNOSIS — B964 Proteus (mirabilis) (morganii) as the cause of diseases classified elsewhere: Secondary | ICD-10-CM | POA: Diagnosis not present

## 2023-08-21 DIAGNOSIS — M869 Osteomyelitis, unspecified: Secondary | ICD-10-CM | POA: Diagnosis not present

## 2023-08-21 DIAGNOSIS — M4628 Osteomyelitis of vertebra, sacral and sacrococcygeal region: Secondary | ICD-10-CM | POA: Diagnosis not present

## 2023-08-22 DIAGNOSIS — B965 Pseudomonas (aeruginosa) (mallei) (pseudomallei) as the cause of diseases classified elsewhere: Secondary | ICD-10-CM | POA: Diagnosis not present

## 2023-08-22 DIAGNOSIS — B9620 Unspecified Escherichia coli [E. coli] as the cause of diseases classified elsewhere: Secondary | ICD-10-CM | POA: Diagnosis not present

## 2023-08-22 DIAGNOSIS — B964 Proteus (mirabilis) (morganii) as the cause of diseases classified elsewhere: Secondary | ICD-10-CM | POA: Diagnosis not present

## 2023-08-22 DIAGNOSIS — M4628 Osteomyelitis of vertebra, sacral and sacrococcygeal region: Secondary | ICD-10-CM | POA: Diagnosis not present

## 2023-08-22 DIAGNOSIS — L89324 Pressure ulcer of left buttock, stage 4: Secondary | ICD-10-CM | POA: Diagnosis not present

## 2023-08-23 DIAGNOSIS — B964 Proteus (mirabilis) (morganii) as the cause of diseases classified elsewhere: Secondary | ICD-10-CM | POA: Diagnosis not present

## 2023-08-23 DIAGNOSIS — M4628 Osteomyelitis of vertebra, sacral and sacrococcygeal region: Secondary | ICD-10-CM | POA: Diagnosis not present

## 2023-08-23 DIAGNOSIS — L89159 Pressure ulcer of sacral region, unspecified stage: Secondary | ICD-10-CM | POA: Diagnosis not present

## 2023-08-23 DIAGNOSIS — B965 Pseudomonas (aeruginosa) (mallei) (pseudomallei) as the cause of diseases classified elsewhere: Secondary | ICD-10-CM | POA: Diagnosis not present

## 2023-08-24 DIAGNOSIS — B965 Pseudomonas (aeruginosa) (mallei) (pseudomallei) as the cause of diseases classified elsewhere: Secondary | ICD-10-CM | POA: Diagnosis not present

## 2023-08-24 DIAGNOSIS — M86672 Other chronic osteomyelitis, left ankle and foot: Secondary | ICD-10-CM | POA: Diagnosis not present

## 2023-08-24 DIAGNOSIS — M87875 Other osteonecrosis, left foot: Secondary | ICD-10-CM | POA: Diagnosis not present

## 2023-08-24 DIAGNOSIS — M4628 Osteomyelitis of vertebra, sacral and sacrococcygeal region: Secondary | ICD-10-CM | POA: Diagnosis not present

## 2023-08-24 DIAGNOSIS — M869 Osteomyelitis, unspecified: Secondary | ICD-10-CM | POA: Diagnosis not present

## 2023-08-24 DIAGNOSIS — L89329 Pressure ulcer of left buttock, unspecified stage: Secondary | ICD-10-CM | POA: Diagnosis not present

## 2023-08-24 DIAGNOSIS — L97414 Non-pressure chronic ulcer of right heel and midfoot with necrosis of bone: Secondary | ICD-10-CM | POA: Diagnosis not present

## 2023-08-24 DIAGNOSIS — B9620 Unspecified Escherichia coli [E. coli] as the cause of diseases classified elsewhere: Secondary | ICD-10-CM | POA: Diagnosis not present

## 2023-08-24 DIAGNOSIS — B957 Other staphylococcus as the cause of diseases classified elsewhere: Secondary | ICD-10-CM | POA: Diagnosis not present

## 2023-08-24 DIAGNOSIS — M86172 Other acute osteomyelitis, left ankle and foot: Secondary | ICD-10-CM | POA: Diagnosis not present

## 2023-08-25 DIAGNOSIS — B9683 Acinetobacter baumannii as the cause of diseases classified elsewhere: Secondary | ICD-10-CM | POA: Diagnosis not present

## 2023-08-25 DIAGNOSIS — M4628 Osteomyelitis of vertebra, sacral and sacrococcygeal region: Secondary | ICD-10-CM | POA: Diagnosis not present

## 2023-08-25 DIAGNOSIS — B964 Proteus (mirabilis) (morganii) as the cause of diseases classified elsewhere: Secondary | ICD-10-CM | POA: Diagnosis not present

## 2023-08-25 DIAGNOSIS — S88012A Complete traumatic amputation at knee level, left lower leg, initial encounter: Secondary | ICD-10-CM | POA: Diagnosis not present

## 2023-08-26 DIAGNOSIS — S88012A Complete traumatic amputation at knee level, left lower leg, initial encounter: Secondary | ICD-10-CM | POA: Diagnosis not present

## 2023-08-26 DIAGNOSIS — B964 Proteus (mirabilis) (morganii) as the cause of diseases classified elsewhere: Secondary | ICD-10-CM | POA: Diagnosis not present

## 2023-08-26 DIAGNOSIS — B9683 Acinetobacter baumannii as the cause of diseases classified elsewhere: Secondary | ICD-10-CM | POA: Diagnosis not present

## 2023-08-26 DIAGNOSIS — M4628 Osteomyelitis of vertebra, sacral and sacrococcygeal region: Secondary | ICD-10-CM | POA: Diagnosis not present

## 2023-08-27 DIAGNOSIS — M4628 Osteomyelitis of vertebra, sacral and sacrococcygeal region: Secondary | ICD-10-CM | POA: Diagnosis not present

## 2023-08-27 DIAGNOSIS — B954 Other streptococcus as the cause of diseases classified elsewhere: Secondary | ICD-10-CM | POA: Diagnosis not present

## 2023-08-27 DIAGNOSIS — B965 Pseudomonas (aeruginosa) (mallei) (pseudomallei) as the cause of diseases classified elsewhere: Secondary | ICD-10-CM | POA: Diagnosis not present

## 2023-08-27 DIAGNOSIS — B9620 Unspecified Escherichia coli [E. coli] as the cause of diseases classified elsewhere: Secondary | ICD-10-CM | POA: Diagnosis not present

## 2023-08-28 ENCOUNTER — Inpatient Hospital Stay: Payer: Medicare HMO

## 2023-08-28 DIAGNOSIS — Z1630 Resistance to unspecified antimicrobial drugs: Secondary | ICD-10-CM | POA: Diagnosis not present

## 2023-08-28 DIAGNOSIS — L89159 Pressure ulcer of sacral region, unspecified stage: Secondary | ICD-10-CM | POA: Diagnosis not present

## 2023-08-28 DIAGNOSIS — M86172 Other acute osteomyelitis, left ankle and foot: Secondary | ICD-10-CM | POA: Diagnosis not present

## 2023-08-28 DIAGNOSIS — A499 Bacterial infection, unspecified: Secondary | ICD-10-CM | POA: Diagnosis not present

## 2023-08-28 DIAGNOSIS — M4628 Osteomyelitis of vertebra, sacral and sacrococcygeal region: Secondary | ICD-10-CM | POA: Diagnosis not present

## 2023-08-29 DIAGNOSIS — Z1630 Resistance to unspecified antimicrobial drugs: Secondary | ICD-10-CM | POA: Diagnosis not present

## 2023-08-29 DIAGNOSIS — A499 Bacterial infection, unspecified: Secondary | ICD-10-CM | POA: Diagnosis not present

## 2023-08-29 DIAGNOSIS — M4628 Osteomyelitis of vertebra, sacral and sacrococcygeal region: Secondary | ICD-10-CM | POA: Diagnosis not present

## 2023-08-29 DIAGNOSIS — L89154 Pressure ulcer of sacral region, stage 4: Secondary | ICD-10-CM | POA: Diagnosis not present

## 2023-08-30 ENCOUNTER — Inpatient Hospital Stay: Payer: Medicare HMO

## 2023-08-30 ENCOUNTER — Inpatient Hospital Stay: Payer: Medicare HMO | Admitting: Oncology

## 2023-08-30 DIAGNOSIS — L89159 Pressure ulcer of sacral region, unspecified stage: Secondary | ICD-10-CM | POA: Diagnosis not present

## 2023-08-30 DIAGNOSIS — L89154 Pressure ulcer of sacral region, stage 4: Secondary | ICD-10-CM | POA: Diagnosis not present

## 2023-08-30 DIAGNOSIS — M4628 Osteomyelitis of vertebra, sacral and sacrococcygeal region: Secondary | ICD-10-CM | POA: Diagnosis not present

## 2023-08-30 DIAGNOSIS — A498 Other bacterial infections of unspecified site: Secondary | ICD-10-CM | POA: Diagnosis not present

## 2023-08-30 DIAGNOSIS — T8789 Other complications of amputation stump: Secondary | ICD-10-CM | POA: Diagnosis not present

## 2023-08-31 DIAGNOSIS — A499 Bacterial infection, unspecified: Secondary | ICD-10-CM | POA: Diagnosis not present

## 2023-08-31 DIAGNOSIS — L89159 Pressure ulcer of sacral region, unspecified stage: Secondary | ICD-10-CM | POA: Diagnosis not present

## 2023-08-31 DIAGNOSIS — T8789 Other complications of amputation stump: Secondary | ICD-10-CM | POA: Diagnosis not present

## 2023-08-31 DIAGNOSIS — M4628 Osteomyelitis of vertebra, sacral and sacrococcygeal region: Secondary | ICD-10-CM | POA: Diagnosis not present

## 2023-09-01 DIAGNOSIS — A499 Bacterial infection, unspecified: Secondary | ICD-10-CM | POA: Diagnosis not present

## 2023-09-01 DIAGNOSIS — L89154 Pressure ulcer of sacral region, stage 4: Secondary | ICD-10-CM | POA: Diagnosis not present

## 2023-09-01 DIAGNOSIS — Z1630 Resistance to unspecified antimicrobial drugs: Secondary | ICD-10-CM | POA: Diagnosis not present

## 2023-09-01 DIAGNOSIS — M4628 Osteomyelitis of vertebra, sacral and sacrococcygeal region: Secondary | ICD-10-CM | POA: Diagnosis not present

## 2023-09-02 DIAGNOSIS — B9689 Other specified bacterial agents as the cause of diseases classified elsewhere: Secondary | ICD-10-CM | POA: Diagnosis not present

## 2023-09-02 DIAGNOSIS — R7881 Bacteremia: Secondary | ICD-10-CM | POA: Diagnosis not present

## 2023-09-02 DIAGNOSIS — L89159 Pressure ulcer of sacral region, unspecified stage: Secondary | ICD-10-CM | POA: Diagnosis not present

## 2023-09-02 DIAGNOSIS — M869 Osteomyelitis, unspecified: Secondary | ICD-10-CM | POA: Diagnosis not present

## 2023-09-03 DIAGNOSIS — R7881 Bacteremia: Secondary | ICD-10-CM | POA: Diagnosis not present

## 2023-09-03 DIAGNOSIS — L89159 Pressure ulcer of sacral region, unspecified stage: Secondary | ICD-10-CM | POA: Diagnosis not present

## 2023-09-03 DIAGNOSIS — M869 Osteomyelitis, unspecified: Secondary | ICD-10-CM | POA: Diagnosis not present

## 2023-09-03 DIAGNOSIS — B9689 Other specified bacterial agents as the cause of diseases classified elsewhere: Secondary | ICD-10-CM | POA: Diagnosis not present

## 2023-09-04 DIAGNOSIS — L89159 Pressure ulcer of sacral region, unspecified stage: Secondary | ICD-10-CM | POA: Diagnosis not present

## 2023-09-04 DIAGNOSIS — A499 Bacterial infection, unspecified: Secondary | ICD-10-CM | POA: Diagnosis not present

## 2023-09-04 DIAGNOSIS — M4628 Osteomyelitis of vertebra, sacral and sacrococcygeal region: Secondary | ICD-10-CM | POA: Diagnosis not present

## 2023-09-04 DIAGNOSIS — Z1630 Resistance to unspecified antimicrobial drugs: Secondary | ICD-10-CM | POA: Diagnosis not present

## 2023-09-05 DIAGNOSIS — M4628 Osteomyelitis of vertebra, sacral and sacrococcygeal region: Secondary | ICD-10-CM | POA: Diagnosis not present

## 2023-09-05 DIAGNOSIS — Z1630 Resistance to unspecified antimicrobial drugs: Secondary | ICD-10-CM | POA: Diagnosis not present

## 2023-09-05 DIAGNOSIS — L89159 Pressure ulcer of sacral region, unspecified stage: Secondary | ICD-10-CM | POA: Diagnosis not present

## 2023-09-05 DIAGNOSIS — L89619 Pressure ulcer of right heel, unspecified stage: Secondary | ICD-10-CM | POA: Diagnosis not present

## 2023-09-05 DIAGNOSIS — A499 Bacterial infection, unspecified: Secondary | ICD-10-CM | POA: Diagnosis not present

## 2023-09-06 DIAGNOSIS — L89154 Pressure ulcer of sacral region, stage 4: Secondary | ICD-10-CM | POA: Diagnosis not present

## 2023-09-06 DIAGNOSIS — A499 Bacterial infection, unspecified: Secondary | ICD-10-CM | POA: Diagnosis not present

## 2023-09-06 DIAGNOSIS — N319 Neuromuscular dysfunction of bladder, unspecified: Secondary | ICD-10-CM | POA: Diagnosis not present

## 2023-09-06 DIAGNOSIS — M4628 Osteomyelitis of vertebra, sacral and sacrococcygeal region: Secondary | ICD-10-CM | POA: Diagnosis not present

## 2023-09-07 DIAGNOSIS — L89159 Pressure ulcer of sacral region, unspecified stage: Secondary | ICD-10-CM | POA: Diagnosis not present

## 2023-09-07 DIAGNOSIS — A499 Bacterial infection, unspecified: Secondary | ICD-10-CM | POA: Diagnosis not present

## 2023-09-07 DIAGNOSIS — N319 Neuromuscular dysfunction of bladder, unspecified: Secondary | ICD-10-CM | POA: Diagnosis not present

## 2023-09-07 DIAGNOSIS — Z1630 Resistance to unspecified antimicrobial drugs: Secondary | ICD-10-CM | POA: Diagnosis not present

## 2023-09-07 DIAGNOSIS — M4628 Osteomyelitis of vertebra, sacral and sacrococcygeal region: Secondary | ICD-10-CM | POA: Diagnosis not present

## 2023-09-08 DIAGNOSIS — M4628 Osteomyelitis of vertebra, sacral and sacrococcygeal region: Secondary | ICD-10-CM | POA: Diagnosis not present

## 2023-09-08 DIAGNOSIS — L89159 Pressure ulcer of sacral region, unspecified stage: Secondary | ICD-10-CM | POA: Diagnosis not present

## 2023-09-08 DIAGNOSIS — A499 Bacterial infection, unspecified: Secondary | ICD-10-CM | POA: Diagnosis not present

## 2023-09-08 DIAGNOSIS — L89619 Pressure ulcer of right heel, unspecified stage: Secondary | ICD-10-CM | POA: Diagnosis not present

## 2023-09-08 DIAGNOSIS — Z1630 Resistance to unspecified antimicrobial drugs: Secondary | ICD-10-CM | POA: Diagnosis not present

## 2023-09-09 DIAGNOSIS — A498 Other bacterial infections of unspecified site: Secondary | ICD-10-CM | POA: Diagnosis not present

## 2023-09-09 DIAGNOSIS — M4628 Osteomyelitis of vertebra, sacral and sacrococcygeal region: Secondary | ICD-10-CM | POA: Diagnosis not present

## 2023-09-09 DIAGNOSIS — L089 Local infection of the skin and subcutaneous tissue, unspecified: Secondary | ICD-10-CM | POA: Diagnosis not present

## 2023-09-09 DIAGNOSIS — L89229 Pressure ulcer of left hip, unspecified stage: Secondary | ICD-10-CM | POA: Diagnosis not present

## 2023-09-10 DIAGNOSIS — L89619 Pressure ulcer of right heel, unspecified stage: Secondary | ICD-10-CM | POA: Diagnosis not present

## 2023-09-10 DIAGNOSIS — L89159 Pressure ulcer of sacral region, unspecified stage: Secondary | ICD-10-CM | POA: Diagnosis not present

## 2023-09-10 DIAGNOSIS — M4628 Osteomyelitis of vertebra, sacral and sacrococcygeal region: Secondary | ICD-10-CM | POA: Diagnosis not present

## 2023-09-10 DIAGNOSIS — A499 Bacterial infection, unspecified: Secondary | ICD-10-CM | POA: Diagnosis not present

## 2023-09-10 DIAGNOSIS — Z1630 Resistance to unspecified antimicrobial drugs: Secondary | ICD-10-CM | POA: Diagnosis not present

## 2023-09-11 DIAGNOSIS — M4628 Osteomyelitis of vertebra, sacral and sacrococcygeal region: Secondary | ICD-10-CM | POA: Diagnosis not present

## 2023-09-11 DIAGNOSIS — L89229 Pressure ulcer of left hip, unspecified stage: Secondary | ICD-10-CM | POA: Diagnosis not present

## 2023-09-11 DIAGNOSIS — L89159 Pressure ulcer of sacral region, unspecified stage: Secondary | ICD-10-CM | POA: Diagnosis not present

## 2023-09-11 DIAGNOSIS — A488 Other specified bacterial diseases: Secondary | ICD-10-CM | POA: Diagnosis not present

## 2023-09-12 DIAGNOSIS — L89229 Pressure ulcer of left hip, unspecified stage: Secondary | ICD-10-CM | POA: Diagnosis not present

## 2023-09-12 DIAGNOSIS — M4628 Osteomyelitis of vertebra, sacral and sacrococcygeal region: Secondary | ICD-10-CM | POA: Diagnosis not present

## 2023-09-12 DIAGNOSIS — A498 Other bacterial infections of unspecified site: Secondary | ICD-10-CM | POA: Diagnosis not present

## 2023-09-12 DIAGNOSIS — L089 Local infection of the skin and subcutaneous tissue, unspecified: Secondary | ICD-10-CM | POA: Diagnosis not present

## 2023-09-13 DIAGNOSIS — A499 Bacterial infection, unspecified: Secondary | ICD-10-CM | POA: Diagnosis not present

## 2023-09-13 DIAGNOSIS — L89159 Pressure ulcer of sacral region, unspecified stage: Secondary | ICD-10-CM | POA: Diagnosis not present

## 2023-09-13 DIAGNOSIS — Z89612 Acquired absence of left leg above knee: Secondary | ICD-10-CM | POA: Insufficient documentation

## 2023-09-13 DIAGNOSIS — Z1635 Resistance to multiple antimicrobial drugs: Secondary | ICD-10-CM | POA: Diagnosis not present

## 2023-09-13 DIAGNOSIS — M4628 Osteomyelitis of vertebra, sacral and sacrococcygeal region: Secondary | ICD-10-CM | POA: Diagnosis not present

## 2023-09-13 DIAGNOSIS — L89229 Pressure ulcer of left hip, unspecified stage: Secondary | ICD-10-CM | POA: Diagnosis not present

## 2023-09-14 DIAGNOSIS — M85871 Other specified disorders of bone density and structure, right ankle and foot: Secondary | ICD-10-CM | POA: Diagnosis not present

## 2023-09-14 DIAGNOSIS — L97519 Non-pressure chronic ulcer of other part of right foot with unspecified severity: Secondary | ICD-10-CM | POA: Diagnosis not present

## 2023-09-14 DIAGNOSIS — L89229 Pressure ulcer of left hip, unspecified stage: Secondary | ICD-10-CM | POA: Diagnosis not present

## 2023-09-14 DIAGNOSIS — A499 Bacterial infection, unspecified: Secondary | ICD-10-CM | POA: Diagnosis not present

## 2023-09-14 DIAGNOSIS — M19071 Primary osteoarthritis, right ankle and foot: Secondary | ICD-10-CM | POA: Diagnosis not present

## 2023-09-14 DIAGNOSIS — M4628 Osteomyelitis of vertebra, sacral and sacrococcygeal region: Secondary | ICD-10-CM | POA: Diagnosis not present

## 2023-09-14 DIAGNOSIS — Z1635 Resistance to multiple antimicrobial drugs: Secondary | ICD-10-CM | POA: Diagnosis not present

## 2023-09-14 DIAGNOSIS — S91301A Unspecified open wound, right foot, initial encounter: Secondary | ICD-10-CM | POA: Diagnosis not present

## 2023-09-15 DIAGNOSIS — L97511 Non-pressure chronic ulcer of other part of right foot limited to breakdown of skin: Secondary | ICD-10-CM | POA: Diagnosis not present

## 2023-09-15 DIAGNOSIS — M4628 Osteomyelitis of vertebra, sacral and sacrococcygeal region: Secondary | ICD-10-CM | POA: Diagnosis not present

## 2023-09-15 DIAGNOSIS — A499 Bacterial infection, unspecified: Secondary | ICD-10-CM | POA: Diagnosis not present

## 2023-09-15 DIAGNOSIS — M86171 Other acute osteomyelitis, right ankle and foot: Secondary | ICD-10-CM | POA: Diagnosis not present

## 2023-09-15 DIAGNOSIS — Z1635 Resistance to multiple antimicrobial drugs: Secondary | ICD-10-CM | POA: Diagnosis not present

## 2023-09-22 NOTE — Nursing Note (Signed)
   09/22/23 1339  Note Type   Note Type Splint Check  Occupational Therapy Session Duration  OT Individual [mins] 8  Pain  Patient / Caregiver reports ok  Pain Comments patient denies pain  Orthotic Management  Orthotic Location RLE  Orthotic Type  (RLE offloading orthosis)  Orthotic Check Patient repositioned;Pressure areas assessed;Splint removed;Splint donned;Splint re-applied  Modification none  Orthotic Education Fitting;Care of the splint;Doffing;Donning;Wear schedule;Precautions for removal of splint to check for redness;Precautions;RN informed about splint schedule and positioning  Orthotic Comments Patient received with orthosis shifting distally off of R LE, orthotic doffed, skin assessed, no s/sx of redness/irritation, orthotic redonned with good fit and R LE repositioned in midline to ensure fit.  Wearing Schedule  Schedule Continuous  Assessment  Assessment see splinting comments  Plan  Plan Adjust splint as needed to ensure proper fit / function  Patient Goals  SHORT GOAL #1 pt will perform seated ADLs at EOB with set up assistance  Date Established  08/25/23  Time Frame  2 weeks  SHORT GOAL #2 pt will perform bed mobility with mod (I) in preparation for ADLs  Date Established  08/25/23  Time Frame  2 weeks  SHORT GOAL #3 pt will perform desensitization techniques seated at EOB independently  Date Established  08/25/23  Time Frame  2 weeks  SHORT GOAL #4 Patient will tolerate custom orthotic sans skin breakdown  Date Established  09/04/23  Time Frame  3 weeks  Daily Interventions  see splinting comments  Plan  In Progress  Long Term Goal #1 Pt will score 20+/24 on AMPAC  Time Frame 4 weeks  Follow-up / Frequency  Plan of Care Initiated 09/21/23  Weekly Frequency 6-7 days per week  Planned Treatment Duration 10/19/23

## 2023-09-28 ENCOUNTER — Ambulatory Visit: Payer: Self-pay | Admitting: Urology

## 2023-09-28 DIAGNOSIS — N319 Neuromuscular dysfunction of bladder, unspecified: Secondary | ICD-10-CM | POA: Diagnosis not present

## 2023-09-28 DIAGNOSIS — L89893 Pressure ulcer of other site, stage 3: Secondary | ICD-10-CM | POA: Diagnosis not present

## 2023-09-28 DIAGNOSIS — I10 Essential (primary) hypertension: Secondary | ICD-10-CM | POA: Diagnosis not present

## 2023-09-28 DIAGNOSIS — L89613 Pressure ulcer of right heel, stage 3: Secondary | ICD-10-CM | POA: Diagnosis not present

## 2023-09-28 DIAGNOSIS — L89154 Pressure ulcer of sacral region, stage 4: Secondary | ICD-10-CM | POA: Diagnosis not present

## 2023-09-28 DIAGNOSIS — E46 Unspecified protein-calorie malnutrition: Secondary | ICD-10-CM | POA: Diagnosis not present

## 2023-09-28 DIAGNOSIS — L89894 Pressure ulcer of other site, stage 4: Secondary | ICD-10-CM | POA: Diagnosis not present

## 2023-09-28 DIAGNOSIS — D509 Iron deficiency anemia, unspecified: Secondary | ICD-10-CM | POA: Diagnosis not present

## 2023-09-28 DIAGNOSIS — I739 Peripheral vascular disease, unspecified: Secondary | ICD-10-CM | POA: Diagnosis not present

## 2023-10-01 DIAGNOSIS — I739 Peripheral vascular disease, unspecified: Secondary | ICD-10-CM | POA: Diagnosis not present

## 2023-10-01 DIAGNOSIS — L89613 Pressure ulcer of right heel, stage 3: Secondary | ICD-10-CM | POA: Diagnosis not present

## 2023-10-01 DIAGNOSIS — N319 Neuromuscular dysfunction of bladder, unspecified: Secondary | ICD-10-CM | POA: Diagnosis not present

## 2023-10-01 DIAGNOSIS — E46 Unspecified protein-calorie malnutrition: Secondary | ICD-10-CM | POA: Diagnosis not present

## 2023-10-01 DIAGNOSIS — D509 Iron deficiency anemia, unspecified: Secondary | ICD-10-CM | POA: Diagnosis not present

## 2023-10-01 DIAGNOSIS — L89893 Pressure ulcer of other site, stage 3: Secondary | ICD-10-CM | POA: Diagnosis not present

## 2023-10-01 DIAGNOSIS — I10 Essential (primary) hypertension: Secondary | ICD-10-CM | POA: Diagnosis not present

## 2023-10-01 DIAGNOSIS — L89894 Pressure ulcer of other site, stage 4: Secondary | ICD-10-CM | POA: Diagnosis not present

## 2023-10-01 DIAGNOSIS — L89154 Pressure ulcer of sacral region, stage 4: Secondary | ICD-10-CM | POA: Diagnosis not present

## 2023-10-02 DIAGNOSIS — Z89612 Acquired absence of left leg above knee: Secondary | ICD-10-CM | POA: Diagnosis not present

## 2023-10-02 DIAGNOSIS — L89150 Pressure ulcer of sacral region, unstageable: Secondary | ICD-10-CM | POA: Diagnosis not present

## 2023-10-02 DIAGNOSIS — D649 Anemia, unspecified: Secondary | ICD-10-CM | POA: Diagnosis not present

## 2023-10-02 DIAGNOSIS — Z09 Encounter for follow-up examination after completed treatment for conditions other than malignant neoplasm: Secondary | ICD-10-CM | POA: Diagnosis not present

## 2023-10-03 DIAGNOSIS — D509 Iron deficiency anemia, unspecified: Secondary | ICD-10-CM | POA: Diagnosis not present

## 2023-10-03 DIAGNOSIS — N319 Neuromuscular dysfunction of bladder, unspecified: Secondary | ICD-10-CM | POA: Diagnosis not present

## 2023-10-03 DIAGNOSIS — I10 Essential (primary) hypertension: Secondary | ICD-10-CM | POA: Diagnosis not present

## 2023-10-03 DIAGNOSIS — E46 Unspecified protein-calorie malnutrition: Secondary | ICD-10-CM | POA: Diagnosis not present

## 2023-10-03 DIAGNOSIS — I739 Peripheral vascular disease, unspecified: Secondary | ICD-10-CM | POA: Diagnosis not present

## 2023-10-03 DIAGNOSIS — L89894 Pressure ulcer of other site, stage 4: Secondary | ICD-10-CM | POA: Diagnosis not present

## 2023-10-03 DIAGNOSIS — L89613 Pressure ulcer of right heel, stage 3: Secondary | ICD-10-CM | POA: Diagnosis not present

## 2023-10-03 DIAGNOSIS — L89154 Pressure ulcer of sacral region, stage 4: Secondary | ICD-10-CM | POA: Diagnosis not present

## 2023-10-03 DIAGNOSIS — L89893 Pressure ulcer of other site, stage 3: Secondary | ICD-10-CM | POA: Diagnosis not present

## 2023-10-04 ENCOUNTER — Encounter: Attending: Physician Assistant | Admitting: Physician Assistant

## 2023-10-04 DIAGNOSIS — L89154 Pressure ulcer of sacral region, stage 4: Secondary | ICD-10-CM | POA: Diagnosis not present

## 2023-10-04 DIAGNOSIS — L89513 Pressure ulcer of right ankle, stage 3: Secondary | ICD-10-CM | POA: Diagnosis not present

## 2023-10-04 DIAGNOSIS — L89324 Pressure ulcer of left buttock, stage 4: Secondary | ICD-10-CM | POA: Diagnosis not present

## 2023-10-04 DIAGNOSIS — M8668 Other chronic osteomyelitis, other site: Secondary | ICD-10-CM | POA: Diagnosis not present

## 2023-10-04 DIAGNOSIS — I7389 Other specified peripheral vascular diseases: Secondary | ICD-10-CM | POA: Diagnosis not present

## 2023-10-04 DIAGNOSIS — G8220 Paraplegia, unspecified: Secondary | ICD-10-CM | POA: Insufficient documentation

## 2023-10-04 DIAGNOSIS — L89894 Pressure ulcer of other site, stage 4: Secondary | ICD-10-CM | POA: Insufficient documentation

## 2023-10-06 DIAGNOSIS — E46 Unspecified protein-calorie malnutrition: Secondary | ICD-10-CM | POA: Diagnosis not present

## 2023-10-06 DIAGNOSIS — I10 Essential (primary) hypertension: Secondary | ICD-10-CM | POA: Diagnosis not present

## 2023-10-06 DIAGNOSIS — L89154 Pressure ulcer of sacral region, stage 4: Secondary | ICD-10-CM | POA: Diagnosis not present

## 2023-10-06 DIAGNOSIS — N319 Neuromuscular dysfunction of bladder, unspecified: Secondary | ICD-10-CM | POA: Diagnosis not present

## 2023-10-06 DIAGNOSIS — L89613 Pressure ulcer of right heel, stage 3: Secondary | ICD-10-CM | POA: Diagnosis not present

## 2023-10-06 DIAGNOSIS — D509 Iron deficiency anemia, unspecified: Secondary | ICD-10-CM | POA: Diagnosis not present

## 2023-10-06 DIAGNOSIS — I739 Peripheral vascular disease, unspecified: Secondary | ICD-10-CM | POA: Diagnosis not present

## 2023-10-06 DIAGNOSIS — L89893 Pressure ulcer of other site, stage 3: Secondary | ICD-10-CM | POA: Diagnosis not present

## 2023-10-06 DIAGNOSIS — L89894 Pressure ulcer of other site, stage 4: Secondary | ICD-10-CM | POA: Diagnosis not present

## 2023-10-09 DIAGNOSIS — I10 Essential (primary) hypertension: Secondary | ICD-10-CM | POA: Diagnosis not present

## 2023-10-09 DIAGNOSIS — E46 Unspecified protein-calorie malnutrition: Secondary | ICD-10-CM | POA: Diagnosis not present

## 2023-10-09 DIAGNOSIS — N319 Neuromuscular dysfunction of bladder, unspecified: Secondary | ICD-10-CM | POA: Diagnosis not present

## 2023-10-09 DIAGNOSIS — I739 Peripheral vascular disease, unspecified: Secondary | ICD-10-CM | POA: Diagnosis not present

## 2023-10-09 DIAGNOSIS — L89613 Pressure ulcer of right heel, stage 3: Secondary | ICD-10-CM | POA: Diagnosis not present

## 2023-10-09 DIAGNOSIS — L89893 Pressure ulcer of other site, stage 3: Secondary | ICD-10-CM | POA: Diagnosis not present

## 2023-10-09 DIAGNOSIS — L89154 Pressure ulcer of sacral region, stage 4: Secondary | ICD-10-CM | POA: Diagnosis not present

## 2023-10-09 DIAGNOSIS — D509 Iron deficiency anemia, unspecified: Secondary | ICD-10-CM | POA: Diagnosis not present

## 2023-10-09 DIAGNOSIS — L89894 Pressure ulcer of other site, stage 4: Secondary | ICD-10-CM | POA: Diagnosis not present

## 2023-10-11 DIAGNOSIS — I10 Essential (primary) hypertension: Secondary | ICD-10-CM | POA: Diagnosis not present

## 2023-10-11 DIAGNOSIS — D509 Iron deficiency anemia, unspecified: Secondary | ICD-10-CM | POA: Diagnosis not present

## 2023-10-11 DIAGNOSIS — L89893 Pressure ulcer of other site, stage 3: Secondary | ICD-10-CM | POA: Diagnosis not present

## 2023-10-11 DIAGNOSIS — N319 Neuromuscular dysfunction of bladder, unspecified: Secondary | ICD-10-CM | POA: Diagnosis not present

## 2023-10-11 DIAGNOSIS — L89613 Pressure ulcer of right heel, stage 3: Secondary | ICD-10-CM | POA: Diagnosis not present

## 2023-10-11 DIAGNOSIS — L89894 Pressure ulcer of other site, stage 4: Secondary | ICD-10-CM | POA: Diagnosis not present

## 2023-10-11 DIAGNOSIS — E46 Unspecified protein-calorie malnutrition: Secondary | ICD-10-CM | POA: Diagnosis not present

## 2023-10-11 DIAGNOSIS — I739 Peripheral vascular disease, unspecified: Secondary | ICD-10-CM | POA: Diagnosis not present

## 2023-10-11 DIAGNOSIS — L89154 Pressure ulcer of sacral region, stage 4: Secondary | ICD-10-CM | POA: Diagnosis not present

## 2023-10-13 DIAGNOSIS — I10 Essential (primary) hypertension: Secondary | ICD-10-CM | POA: Diagnosis not present

## 2023-10-13 DIAGNOSIS — L89894 Pressure ulcer of other site, stage 4: Secondary | ICD-10-CM | POA: Diagnosis not present

## 2023-10-13 DIAGNOSIS — N319 Neuromuscular dysfunction of bladder, unspecified: Secondary | ICD-10-CM | POA: Diagnosis not present

## 2023-10-13 DIAGNOSIS — L89893 Pressure ulcer of other site, stage 3: Secondary | ICD-10-CM | POA: Diagnosis not present

## 2023-10-13 DIAGNOSIS — L89154 Pressure ulcer of sacral region, stage 4: Secondary | ICD-10-CM | POA: Diagnosis not present

## 2023-10-13 DIAGNOSIS — D509 Iron deficiency anemia, unspecified: Secondary | ICD-10-CM | POA: Diagnosis not present

## 2023-10-13 DIAGNOSIS — L89613 Pressure ulcer of right heel, stage 3: Secondary | ICD-10-CM | POA: Diagnosis not present

## 2023-10-13 DIAGNOSIS — E46 Unspecified protein-calorie malnutrition: Secondary | ICD-10-CM | POA: Diagnosis not present

## 2023-10-13 DIAGNOSIS — I739 Peripheral vascular disease, unspecified: Secondary | ICD-10-CM | POA: Diagnosis not present

## 2023-10-16 ENCOUNTER — Encounter: Attending: Physician Assistant | Admitting: Physician Assistant

## 2023-10-16 DIAGNOSIS — G8220 Paraplegia, unspecified: Secondary | ICD-10-CM | POA: Diagnosis not present

## 2023-10-16 DIAGNOSIS — L89513 Pressure ulcer of right ankle, stage 3: Secondary | ICD-10-CM | POA: Diagnosis not present

## 2023-10-16 DIAGNOSIS — D509 Iron deficiency anemia, unspecified: Secondary | ICD-10-CM | POA: Diagnosis not present

## 2023-10-16 DIAGNOSIS — L89324 Pressure ulcer of left buttock, stage 4: Secondary | ICD-10-CM | POA: Diagnosis not present

## 2023-10-16 DIAGNOSIS — I7389 Other specified peripheral vascular diseases: Secondary | ICD-10-CM | POA: Insufficient documentation

## 2023-10-16 DIAGNOSIS — N319 Neuromuscular dysfunction of bladder, unspecified: Secondary | ICD-10-CM | POA: Diagnosis not present

## 2023-10-16 DIAGNOSIS — I739 Peripheral vascular disease, unspecified: Secondary | ICD-10-CM | POA: Diagnosis not present

## 2023-10-16 DIAGNOSIS — M8668 Other chronic osteomyelitis, other site: Secondary | ICD-10-CM | POA: Diagnosis not present

## 2023-10-16 DIAGNOSIS — I10 Essential (primary) hypertension: Secondary | ICD-10-CM | POA: Diagnosis not present

## 2023-10-16 DIAGNOSIS — L89613 Pressure ulcer of right heel, stage 3: Secondary | ICD-10-CM | POA: Diagnosis not present

## 2023-10-16 DIAGNOSIS — L89154 Pressure ulcer of sacral region, stage 4: Secondary | ICD-10-CM | POA: Diagnosis not present

## 2023-10-16 DIAGNOSIS — L89894 Pressure ulcer of other site, stage 4: Secondary | ICD-10-CM | POA: Insufficient documentation

## 2023-10-16 DIAGNOSIS — L89893 Pressure ulcer of other site, stage 3: Secondary | ICD-10-CM | POA: Diagnosis not present

## 2023-10-16 DIAGNOSIS — E46 Unspecified protein-calorie malnutrition: Secondary | ICD-10-CM | POA: Diagnosis not present

## 2023-10-17 ENCOUNTER — Ambulatory Visit (INDEPENDENT_AMBULATORY_CARE_PROVIDER_SITE_OTHER): Admitting: Physician Assistant

## 2023-10-17 VITALS — BP 107/57 | HR 87 | Temp 98.6°F | Ht 74.0 in | Wt 213.0 lb

## 2023-10-17 DIAGNOSIS — N368 Other specified disorders of urethra: Secondary | ICD-10-CM | POA: Diagnosis not present

## 2023-10-17 DIAGNOSIS — N319 Neuromuscular dysfunction of bladder, unspecified: Secondary | ICD-10-CM

## 2023-10-17 DIAGNOSIS — T8389XA Other specified complication of genitourinary prosthetic devices, implants and grafts, initial encounter: Secondary | ICD-10-CM

## 2023-10-17 NOTE — Progress Notes (Signed)
 10/17/2023 1:49 PM   Bradley [name] [birth year] [id number]  CC: No chief complaint on file.  HPI: Bradley [name] is a 78 y.o. male with PMH neurogenic bladder previously managed by CIC and condom catheters, high risk hematuria, ED s/p IPP, and hypogonadism who presents today for catheter change.   He was seen in clinic most recently by [facility] on 08/03/2023, at which point he planned to resume CIC.  He was subsequently admitted at [facility] from 08/10/2023 to 09/26/2023 with complicated SSTI.  He was noted at that time to have a wound at the base of his penis due to chronic condom catheter use and a Foley catheter was placed for urinary diversion.  It was exchanged most recently on 09/10/2023.  Today he reports he would prefer to keep the Foley catheter long-term.  He wonders if home health could do this for him, as they are already providing him services.  PMH: Past Medical History:  Diagnosis Date   Anemia    Arthritis    Paralysis of both lower limbs (HCC)    Sacral decubitus ulcer     Surgical History: Past Surgical History:  Procedure Laterality Date   COLONOSCOPY  06/06/2014   INCISION AND DRAINAGE OF WOUND N/A 06/04/2018   Procedure: IRRIGATION AND DEBRIDEMENT WOUND;  Surgeon: Emmalene Hare, MD;  Location: [facility] ORS;  Service: General;  Laterality: N/A;   TONSILLECTOMY     WOUND DEBRIDEMENT Left 01/28/2023   Procedure: LEFT LEG DEBRIDEMENT OF ULCER NEAR GLUTEUS;  Surgeon: Conrado Delay, DO;  Location: [facility] ORS;  Service: General;  Laterality: Left;    Home Medications:  Allergies as of 10/17/2023       Reactions   Lisinopril Cough        Medication List        Accurate as of Oct 17, 2023  1:49 PM. If you have any questions, ask your nurse or doctor.          acetaminophen  500 MG tablet Commonly known as: TYLENOL  Take 1,000 mg by mouth 3 (three) times daily.   AMBULATORY NON FORMULARY MEDICATION Patient needs size 36 condom catheters.  He uses two condom catheters  along with collection bags daily.   ascorbic acid  500 MG tablet Commonly known as: VITAMIN C  Take 500 mg by mouth 2 (two) times daily.   chlorthalidone  25 MG tablet Commonly known as: HYGROTON  Take 25 mg by mouth daily.   ferrous sulfate  325 (65 FE) MG EC tablet Take 1 tablet (325 mg total) by mouth in the morning and at bedtime.   Multi-Vitamins Tabs Take 1 tablet by mouth daily.   polyethylene glycol powder 17 GM/SCOOP powder Commonly known as: GLYCOLAX /MIRALAX  Take 17 g by mouth daily as needed for mild constipation.        Allergies:  Allergies  Allergen Reactions   Lisinopril Cough    Family History: Family History  Problem Relation Age of Onset   Breast cancer Mother     Social History:   reports that he quit smoking about 53 years ago. His smoking use included cigarettes. He started smoking about 58 years ago. He has a 2.5 pack-year smoking history. He has never used smokeless tobacco. He reports that he does not drink alcohol and does not use drugs.  Physical Exam: BP (!) 107/57   Pulse 87   Temp 98.6 F (37 C) (Oral)   Ht 6\' 2"  (1.88 m)   Wt 213 lb (96.6 kg)   BMI 27.35  kg/m   Constitutional:  Alert and oriented, no acute distress, nontoxic appearing HEENT: [place], AT Cardiovascular: No clubbing, cyanosis, or edema Respiratory: Normal respiratory effort, no increased work of breathing GU: Silicone Foley catheter in place, dorsal erosion of the urethral meatus noted.  IPP cylinders palpable, but not exposed. Skin: No rashes, bruises or suspicious lesions Neurologic: In wheelchair Psychiatric: Normal mood and affect  Cath Change/ Replacement  Patient is present today for a catheter change due to urinary retention.  4ml of water was removed from the balloon, a 16FR silicone foley cath was removed without difficulty.  Patient was cleaned and prepped in a sterile fashion with betadine and 2% lidocaine  jelly was instilled into the urethra. A 16 FR foley cath  was replaced into the bladder, no complications were noted. Urine return was noted 5ml and urine was yellow in color. The balloon was filled with 10ml of sterile water. A leg bag was attached for drainage.  Patient tolerated well.    Performed by: Talbot Monarch, PA-C   Assessment & Plan:   1. Neurogenic bladder (Primary) Foley catheter exchanged in clinic as above.  Will provide verbal orders to [facility] to continue this on a monthly basis, or sooner as needed due to catheter malfunction.  I do recommend sticking with latex catheters only, as I think silicone catheters are too stiff and will promote IPP erosion; see below.  2. Erosion of urethra due to catheterization of urinary tract, initial encounter [facility]) He had some mild catheter erosion noted on physical exam today.  We discussed that this will not resolve on its own and he is at risk of it worsening.  I was also very honest with him that I do not recommend long-term urethral catheterization with his IPP in place, and the risk of this would include IPP erosion and infection requiring emergency explant.  I recommended either resuming CIC or transitioning to a suprapubic catheter.  He will think about this and let me know.  Return in about 1 year (around 10/16/2024) for Annual follow up.  Kathreen Pare, PA-C  [facility] [place] [address], [address] [place], [place] [address] [phone]

## 2023-10-18 ENCOUNTER — Ambulatory Visit: Admitting: Physician Assistant

## 2023-10-18 DIAGNOSIS — N319 Neuromuscular dysfunction of bladder, unspecified: Secondary | ICD-10-CM | POA: Diagnosis not present

## 2023-10-18 DIAGNOSIS — L89613 Pressure ulcer of right heel, stage 3: Secondary | ICD-10-CM | POA: Diagnosis not present

## 2023-10-18 DIAGNOSIS — L89894 Pressure ulcer of other site, stage 4: Secondary | ICD-10-CM | POA: Diagnosis not present

## 2023-10-18 DIAGNOSIS — I10 Essential (primary) hypertension: Secondary | ICD-10-CM | POA: Diagnosis not present

## 2023-10-18 DIAGNOSIS — L89893 Pressure ulcer of other site, stage 3: Secondary | ICD-10-CM | POA: Diagnosis not present

## 2023-10-18 DIAGNOSIS — E46 Unspecified protein-calorie malnutrition: Secondary | ICD-10-CM | POA: Diagnosis not present

## 2023-10-18 DIAGNOSIS — D509 Iron deficiency anemia, unspecified: Secondary | ICD-10-CM | POA: Diagnosis not present

## 2023-10-18 DIAGNOSIS — L89154 Pressure ulcer of sacral region, stage 4: Secondary | ICD-10-CM | POA: Diagnosis not present

## 2023-10-18 DIAGNOSIS — I739 Peripheral vascular disease, unspecified: Secondary | ICD-10-CM | POA: Diagnosis not present

## 2023-10-20 DIAGNOSIS — L89613 Pressure ulcer of right heel, stage 3: Secondary | ICD-10-CM | POA: Diagnosis not present

## 2023-10-20 DIAGNOSIS — N319 Neuromuscular dysfunction of bladder, unspecified: Secondary | ICD-10-CM | POA: Diagnosis not present

## 2023-10-20 DIAGNOSIS — L89894 Pressure ulcer of other site, stage 4: Secondary | ICD-10-CM | POA: Diagnosis not present

## 2023-10-20 DIAGNOSIS — L89893 Pressure ulcer of other site, stage 3: Secondary | ICD-10-CM | POA: Diagnosis not present

## 2023-10-20 DIAGNOSIS — I739 Peripheral vascular disease, unspecified: Secondary | ICD-10-CM | POA: Diagnosis not present

## 2023-10-20 DIAGNOSIS — D509 Iron deficiency anemia, unspecified: Secondary | ICD-10-CM | POA: Diagnosis not present

## 2023-10-20 DIAGNOSIS — L89154 Pressure ulcer of sacral region, stage 4: Secondary | ICD-10-CM | POA: Diagnosis not present

## 2023-10-20 DIAGNOSIS — I10 Essential (primary) hypertension: Secondary | ICD-10-CM | POA: Diagnosis not present

## 2023-10-20 DIAGNOSIS — E46 Unspecified protein-calorie malnutrition: Secondary | ICD-10-CM | POA: Diagnosis not present

## 2023-10-23 DIAGNOSIS — I10 Essential (primary) hypertension: Secondary | ICD-10-CM | POA: Diagnosis not present

## 2023-10-23 DIAGNOSIS — I739 Peripheral vascular disease, unspecified: Secondary | ICD-10-CM | POA: Diagnosis not present

## 2023-10-23 DIAGNOSIS — N319 Neuromuscular dysfunction of bladder, unspecified: Secondary | ICD-10-CM | POA: Diagnosis not present

## 2023-10-23 DIAGNOSIS — D509 Iron deficiency anemia, unspecified: Secondary | ICD-10-CM | POA: Diagnosis not present

## 2023-10-23 DIAGNOSIS — E46 Unspecified protein-calorie malnutrition: Secondary | ICD-10-CM | POA: Diagnosis not present

## 2023-10-23 DIAGNOSIS — L89154 Pressure ulcer of sacral region, stage 4: Secondary | ICD-10-CM | POA: Diagnosis not present

## 2023-10-23 DIAGNOSIS — L89894 Pressure ulcer of other site, stage 4: Secondary | ICD-10-CM | POA: Diagnosis not present

## 2023-10-23 DIAGNOSIS — L89613 Pressure ulcer of right heel, stage 3: Secondary | ICD-10-CM | POA: Diagnosis not present

## 2023-10-23 DIAGNOSIS — L89893 Pressure ulcer of other site, stage 3: Secondary | ICD-10-CM | POA: Diagnosis not present

## 2023-10-25 ENCOUNTER — Encounter: Admitting: Physician Assistant

## 2023-10-25 DIAGNOSIS — M8668 Other chronic osteomyelitis, other site: Secondary | ICD-10-CM | POA: Diagnosis not present

## 2023-10-25 DIAGNOSIS — L89324 Pressure ulcer of left buttock, stage 4: Secondary | ICD-10-CM | POA: Diagnosis not present

## 2023-10-25 DIAGNOSIS — G8220 Paraplegia, unspecified: Secondary | ICD-10-CM | POA: Diagnosis not present

## 2023-10-25 DIAGNOSIS — L89154 Pressure ulcer of sacral region, stage 4: Secondary | ICD-10-CM | POA: Diagnosis not present

## 2023-10-25 DIAGNOSIS — L97412 Non-pressure chronic ulcer of right heel and midfoot with fat layer exposed: Secondary | ICD-10-CM | POA: Diagnosis not present

## 2023-10-25 DIAGNOSIS — L89513 Pressure ulcer of right ankle, stage 3: Secondary | ICD-10-CM | POA: Diagnosis not present

## 2023-10-25 DIAGNOSIS — L97812 Non-pressure chronic ulcer of other part of right lower leg with fat layer exposed: Secondary | ICD-10-CM | POA: Diagnosis not present

## 2023-10-25 DIAGNOSIS — I7389 Other specified peripheral vascular diseases: Secondary | ICD-10-CM | POA: Diagnosis not present

## 2023-10-25 DIAGNOSIS — L89894 Pressure ulcer of other site, stage 4: Secondary | ICD-10-CM | POA: Diagnosis not present

## 2023-10-26 DIAGNOSIS — L89893 Pressure ulcer of other site, stage 3: Secondary | ICD-10-CM | POA: Diagnosis not present

## 2023-10-26 DIAGNOSIS — L89613 Pressure ulcer of right heel, stage 3: Secondary | ICD-10-CM | POA: Diagnosis not present

## 2023-10-26 DIAGNOSIS — E46 Unspecified protein-calorie malnutrition: Secondary | ICD-10-CM | POA: Diagnosis not present

## 2023-10-26 DIAGNOSIS — L89894 Pressure ulcer of other site, stage 4: Secondary | ICD-10-CM | POA: Diagnosis not present

## 2023-10-26 DIAGNOSIS — I739 Peripheral vascular disease, unspecified: Secondary | ICD-10-CM | POA: Diagnosis not present

## 2023-10-26 DIAGNOSIS — D509 Iron deficiency anemia, unspecified: Secondary | ICD-10-CM | POA: Diagnosis not present

## 2023-10-26 DIAGNOSIS — L89154 Pressure ulcer of sacral region, stage 4: Secondary | ICD-10-CM | POA: Diagnosis not present

## 2023-10-26 DIAGNOSIS — N319 Neuromuscular dysfunction of bladder, unspecified: Secondary | ICD-10-CM | POA: Diagnosis not present

## 2023-10-26 DIAGNOSIS — I10 Essential (primary) hypertension: Secondary | ICD-10-CM | POA: Diagnosis not present

## 2023-10-28 DIAGNOSIS — L89154 Pressure ulcer of sacral region, stage 4: Secondary | ICD-10-CM | POA: Diagnosis not present

## 2023-10-28 DIAGNOSIS — L89613 Pressure ulcer of right heel, stage 3: Secondary | ICD-10-CM | POA: Diagnosis not present

## 2023-10-28 DIAGNOSIS — I10 Essential (primary) hypertension: Secondary | ICD-10-CM | POA: Diagnosis not present

## 2023-10-28 DIAGNOSIS — D509 Iron deficiency anemia, unspecified: Secondary | ICD-10-CM | POA: Diagnosis not present

## 2023-10-28 DIAGNOSIS — E46 Unspecified protein-calorie malnutrition: Secondary | ICD-10-CM | POA: Diagnosis not present

## 2023-10-28 DIAGNOSIS — L89893 Pressure ulcer of other site, stage 3: Secondary | ICD-10-CM | POA: Diagnosis not present

## 2023-10-28 DIAGNOSIS — N319 Neuromuscular dysfunction of bladder, unspecified: Secondary | ICD-10-CM | POA: Diagnosis not present

## 2023-10-28 DIAGNOSIS — L89894 Pressure ulcer of other site, stage 4: Secondary | ICD-10-CM | POA: Diagnosis not present

## 2023-10-28 DIAGNOSIS — I739 Peripheral vascular disease, unspecified: Secondary | ICD-10-CM | POA: Diagnosis not present

## 2023-10-30 DIAGNOSIS — I10 Essential (primary) hypertension: Secondary | ICD-10-CM | POA: Diagnosis not present

## 2023-10-30 DIAGNOSIS — D509 Iron deficiency anemia, unspecified: Secondary | ICD-10-CM | POA: Diagnosis not present

## 2023-10-30 DIAGNOSIS — E46 Unspecified protein-calorie malnutrition: Secondary | ICD-10-CM | POA: Diagnosis not present

## 2023-10-30 DIAGNOSIS — L89893 Pressure ulcer of other site, stage 3: Secondary | ICD-10-CM | POA: Diagnosis not present

## 2023-10-30 DIAGNOSIS — N319 Neuromuscular dysfunction of bladder, unspecified: Secondary | ICD-10-CM | POA: Diagnosis not present

## 2023-10-30 DIAGNOSIS — I739 Peripheral vascular disease, unspecified: Secondary | ICD-10-CM | POA: Diagnosis not present

## 2023-10-30 DIAGNOSIS — L89613 Pressure ulcer of right heel, stage 3: Secondary | ICD-10-CM | POA: Diagnosis not present

## 2023-10-30 DIAGNOSIS — L89154 Pressure ulcer of sacral region, stage 4: Secondary | ICD-10-CM | POA: Diagnosis not present

## 2023-10-30 DIAGNOSIS — L89894 Pressure ulcer of other site, stage 4: Secondary | ICD-10-CM | POA: Diagnosis not present

## 2023-11-01 DIAGNOSIS — L89613 Pressure ulcer of right heel, stage 3: Secondary | ICD-10-CM | POA: Diagnosis not present

## 2023-11-01 DIAGNOSIS — L89154 Pressure ulcer of sacral region, stage 4: Secondary | ICD-10-CM | POA: Diagnosis not present

## 2023-11-01 DIAGNOSIS — D509 Iron deficiency anemia, unspecified: Secondary | ICD-10-CM | POA: Diagnosis not present

## 2023-11-01 DIAGNOSIS — I10 Essential (primary) hypertension: Secondary | ICD-10-CM | POA: Diagnosis not present

## 2023-11-01 DIAGNOSIS — L89894 Pressure ulcer of other site, stage 4: Secondary | ICD-10-CM | POA: Diagnosis not present

## 2023-11-01 DIAGNOSIS — E46 Unspecified protein-calorie malnutrition: Secondary | ICD-10-CM | POA: Diagnosis not present

## 2023-11-01 DIAGNOSIS — Z89522 Acquired absence of left knee: Secondary | ICD-10-CM | POA: Diagnosis not present

## 2023-11-01 DIAGNOSIS — L89893 Pressure ulcer of other site, stage 3: Secondary | ICD-10-CM | POA: Diagnosis not present

## 2023-11-01 DIAGNOSIS — N319 Neuromuscular dysfunction of bladder, unspecified: Secondary | ICD-10-CM | POA: Diagnosis not present

## 2023-11-01 DIAGNOSIS — Z4781 Encounter for orthopedic aftercare following surgical amputation: Secondary | ICD-10-CM | POA: Diagnosis not present

## 2023-11-01 DIAGNOSIS — I739 Peripheral vascular disease, unspecified: Secondary | ICD-10-CM | POA: Diagnosis not present

## 2023-11-01 DIAGNOSIS — Z4802 Encounter for removal of sutures: Secondary | ICD-10-CM | POA: Diagnosis not present

## 2023-11-03 ENCOUNTER — Encounter: Admitting: Physician Assistant

## 2023-11-03 DIAGNOSIS — L89893 Pressure ulcer of other site, stage 3: Secondary | ICD-10-CM | POA: Diagnosis not present

## 2023-11-03 DIAGNOSIS — I7389 Other specified peripheral vascular diseases: Secondary | ICD-10-CM | POA: Diagnosis not present

## 2023-11-03 DIAGNOSIS — L89154 Pressure ulcer of sacral region, stage 4: Secondary | ICD-10-CM | POA: Diagnosis not present

## 2023-11-03 DIAGNOSIS — D509 Iron deficiency anemia, unspecified: Secondary | ICD-10-CM | POA: Diagnosis not present

## 2023-11-03 DIAGNOSIS — L89894 Pressure ulcer of other site, stage 4: Secondary | ICD-10-CM | POA: Diagnosis not present

## 2023-11-03 DIAGNOSIS — L89513 Pressure ulcer of right ankle, stage 3: Secondary | ICD-10-CM | POA: Diagnosis not present

## 2023-11-03 DIAGNOSIS — I739 Peripheral vascular disease, unspecified: Secondary | ICD-10-CM | POA: Diagnosis not present

## 2023-11-03 DIAGNOSIS — M8668 Other chronic osteomyelitis, other site: Secondary | ICD-10-CM | POA: Diagnosis not present

## 2023-11-03 DIAGNOSIS — L89324 Pressure ulcer of left buttock, stage 4: Secondary | ICD-10-CM | POA: Diagnosis not present

## 2023-11-03 DIAGNOSIS — E46 Unspecified protein-calorie malnutrition: Secondary | ICD-10-CM | POA: Diagnosis not present

## 2023-11-03 DIAGNOSIS — I10 Essential (primary) hypertension: Secondary | ICD-10-CM | POA: Diagnosis not present

## 2023-11-03 DIAGNOSIS — N319 Neuromuscular dysfunction of bladder, unspecified: Secondary | ICD-10-CM | POA: Diagnosis not present

## 2023-11-03 DIAGNOSIS — L89613 Pressure ulcer of right heel, stage 3: Secondary | ICD-10-CM | POA: Diagnosis not present

## 2023-11-03 DIAGNOSIS — G8220 Paraplegia, unspecified: Secondary | ICD-10-CM | POA: Diagnosis not present

## 2023-11-03 DIAGNOSIS — L97812 Non-pressure chronic ulcer of other part of right lower leg with fat layer exposed: Secondary | ICD-10-CM | POA: Diagnosis not present

## 2023-11-06 DIAGNOSIS — L89154 Pressure ulcer of sacral region, stage 4: Secondary | ICD-10-CM | POA: Diagnosis not present

## 2023-11-06 DIAGNOSIS — E46 Unspecified protein-calorie malnutrition: Secondary | ICD-10-CM | POA: Diagnosis not present

## 2023-11-06 DIAGNOSIS — I10 Essential (primary) hypertension: Secondary | ICD-10-CM | POA: Diagnosis not present

## 2023-11-06 DIAGNOSIS — D509 Iron deficiency anemia, unspecified: Secondary | ICD-10-CM | POA: Diagnosis not present

## 2023-11-06 DIAGNOSIS — I739 Peripheral vascular disease, unspecified: Secondary | ICD-10-CM | POA: Diagnosis not present

## 2023-11-06 DIAGNOSIS — N319 Neuromuscular dysfunction of bladder, unspecified: Secondary | ICD-10-CM | POA: Diagnosis not present

## 2023-11-06 DIAGNOSIS — L89894 Pressure ulcer of other site, stage 4: Secondary | ICD-10-CM | POA: Diagnosis not present

## 2023-11-06 DIAGNOSIS — L89613 Pressure ulcer of right heel, stage 3: Secondary | ICD-10-CM | POA: Diagnosis not present

## 2023-11-06 DIAGNOSIS — L89893 Pressure ulcer of other site, stage 3: Secondary | ICD-10-CM | POA: Diagnosis not present

## 2023-11-08 DIAGNOSIS — L89154 Pressure ulcer of sacral region, stage 4: Secondary | ICD-10-CM | POA: Diagnosis not present

## 2023-11-08 DIAGNOSIS — E46 Unspecified protein-calorie malnutrition: Secondary | ICD-10-CM | POA: Diagnosis not present

## 2023-11-08 DIAGNOSIS — D509 Iron deficiency anemia, unspecified: Secondary | ICD-10-CM | POA: Diagnosis not present

## 2023-11-08 DIAGNOSIS — I10 Essential (primary) hypertension: Secondary | ICD-10-CM | POA: Diagnosis not present

## 2023-11-08 DIAGNOSIS — N319 Neuromuscular dysfunction of bladder, unspecified: Secondary | ICD-10-CM | POA: Diagnosis not present

## 2023-11-08 DIAGNOSIS — I739 Peripheral vascular disease, unspecified: Secondary | ICD-10-CM | POA: Diagnosis not present

## 2023-11-08 DIAGNOSIS — L89894 Pressure ulcer of other site, stage 4: Secondary | ICD-10-CM | POA: Diagnosis not present

## 2023-11-08 DIAGNOSIS — L89893 Pressure ulcer of other site, stage 3: Secondary | ICD-10-CM | POA: Diagnosis not present

## 2023-11-08 DIAGNOSIS — L89613 Pressure ulcer of right heel, stage 3: Secondary | ICD-10-CM | POA: Diagnosis not present

## 2023-11-10 ENCOUNTER — Other Ambulatory Visit: Payer: Self-pay

## 2023-11-10 ENCOUNTER — Emergency Department
Admission: EM | Admit: 2023-11-10 | Discharge: 2023-11-10 | Disposition: A | Discharge status: Home / Self Care | Attending: Emergency Medicine | Admitting: Emergency Medicine

## 2023-11-10 DIAGNOSIS — N319 Neuromuscular dysfunction of bladder, unspecified: Secondary | ICD-10-CM | POA: Diagnosis not present

## 2023-11-10 DIAGNOSIS — Z960 Presence of urogenital implants: Secondary | ICD-10-CM | POA: Insufficient documentation

## 2023-11-10 DIAGNOSIS — I959 Hypotension, unspecified: Secondary | ICD-10-CM | POA: Diagnosis not present

## 2023-11-10 DIAGNOSIS — I739 Peripheral vascular disease, unspecified: Secondary | ICD-10-CM | POA: Diagnosis not present

## 2023-11-10 DIAGNOSIS — Z4800 Encounter for change or removal of nonsurgical wound dressing: Secondary | ICD-10-CM | POA: Diagnosis present

## 2023-11-10 DIAGNOSIS — L89893 Pressure ulcer of other site, stage 3: Secondary | ICD-10-CM | POA: Diagnosis not present

## 2023-11-10 DIAGNOSIS — R9431 Abnormal electrocardiogram [ECG] [EKG]: Secondary | ICD-10-CM | POA: Diagnosis not present

## 2023-11-10 DIAGNOSIS — L89154 Pressure ulcer of sacral region, stage 4: Secondary | ICD-10-CM | POA: Diagnosis not present

## 2023-11-10 DIAGNOSIS — L89894 Pressure ulcer of other site, stage 4: Secondary | ICD-10-CM | POA: Diagnosis not present

## 2023-11-10 DIAGNOSIS — L89613 Pressure ulcer of right heel, stage 3: Secondary | ICD-10-CM | POA: Diagnosis not present

## 2023-11-10 DIAGNOSIS — D509 Iron deficiency anemia, unspecified: Secondary | ICD-10-CM | POA: Diagnosis not present

## 2023-11-10 DIAGNOSIS — N390 Urinary tract infection, site not specified: Secondary | ICD-10-CM | POA: Insufficient documentation

## 2023-11-10 DIAGNOSIS — R58 Hemorrhage, not elsewhere classified: Secondary | ICD-10-CM | POA: Diagnosis not present

## 2023-11-10 DIAGNOSIS — I10 Essential (primary) hypertension: Secondary | ICD-10-CM | POA: Diagnosis not present

## 2023-11-10 DIAGNOSIS — T83511A Infection and inflammatory reaction due to indwelling urethral catheter, initial encounter: Secondary | ICD-10-CM | POA: Diagnosis not present

## 2023-11-10 DIAGNOSIS — D72829 Elevated white blood cell count, unspecified: Secondary | ICD-10-CM | POA: Insufficient documentation

## 2023-11-10 DIAGNOSIS — Z5189 Encounter for other specified aftercare: Secondary | ICD-10-CM

## 2023-11-10 DIAGNOSIS — E46 Unspecified protein-calorie malnutrition: Secondary | ICD-10-CM | POA: Diagnosis not present

## 2023-11-10 LAB — URINALYSIS, W/ REFLEX TO CULTURE (INFECTION SUSPECTED)
Bilirubin Urine: NEGATIVE
Glucose, UA: NEGATIVE mg/dL
Ketones, ur: NEGATIVE mg/dL
Nitrite: NEGATIVE
Protein, ur: 100 mg/dL — AB
Specific Gravity, Urine: 1.017 (ref 1.005–1.030)
WBC, UA: >50 WBC/hpf (ref 0–5)
pH: 7 (ref 5.0–8.0)

## 2023-11-10 LAB — COMPREHENSIVE METABOLIC PANEL WITH GFR
ALT: 8 U/L (ref 0–44)
AST: 17 U/L (ref 15–41)
Albumin: 2.2 g/dL — ABNORMAL LOW (ref 3.5–5.0)
Alkaline Phosphatase: 63 U/L (ref 38–126)
Anion gap: 10 (ref 5–15)
BUN: 16 mg/dL (ref 8–23)
CO2: 29 mmol/L (ref 22–32)
Calcium: 8.5 mg/dL — ABNORMAL LOW (ref 8.9–10.3)
Chloride: 97 mmol/L — ABNORMAL LOW (ref 98–111)
Creatinine, Ser: 0.62 mg/dL (ref 0.61–1.24)
GFR, Estimated: >60 mL/min (ref >60)
Glucose, Bld: 99 mg/dL (ref 70–99)
Potassium: 2.6 mmol/L — CL (ref 3.5–5.1)
Sodium: 136 mmol/L (ref 135–145)
Total Bilirubin: 0.6 mg/dL (ref 0.0–1.2)
Total Protein: 8.6 g/dL — ABNORMAL HIGH (ref 6.5–8.1)

## 2023-11-10 LAB — CBC WITH DIFFERENTIAL/PLATELET
Abs Immature Granulocytes: 0.04 10*3/uL (ref 0.00–0.07)
Basophils Absolute: 0 10*3/uL (ref 0.0–0.1)
Basophils Relative: 0 %
Eosinophils Absolute: 0.3 10*3/uL (ref 0.0–0.5)
Eosinophils Relative: 3 %
HCT: 30.6 % — ABNORMAL LOW (ref 39.0–52.0)
Hemoglobin: 9 g/dL — ABNORMAL LOW (ref 13.0–17.0)
Immature Granulocytes: 0 %
Lymphocytes Relative: 24 %
Lymphs Abs: 2.8 10*3/uL (ref 0.7–4.0)
MCH: 20.2 pg — ABNORMAL LOW (ref 26.0–34.0)
MCHC: 29.4 g/dL — ABNORMAL LOW (ref 30.0–36.0)
MCV: 68.6 fL — ABNORMAL LOW (ref 80.0–100.0)
Monocytes Absolute: 1 10*3/uL (ref 0.1–1.0)
Monocytes Relative: 8 %
Neutro Abs: 7.6 10*3/uL (ref 1.7–7.7)
Neutrophils Relative %: 65 %
Platelets: 603 10*3/uL — ABNORMAL HIGH (ref 150–400)
RBC: 4.46 MIL/uL (ref 4.22–5.81)
RDW: 18.1 % — ABNORMAL HIGH (ref 11.5–15.5)
WBC: 11.7 10*3/uL — ABNORMAL HIGH (ref 4.0–10.5)
nRBC: 0 % (ref 0.0–0.2)

## 2023-11-10 LAB — MAGNESIUM: Magnesium: 2.4 mg/dL (ref 1.7–2.4)

## 2023-11-10 MED ORDER — CEFPODOXIME PROXETIL 200 MG PO TABS: 200.0000 mg | ORAL_TABLET | Freq: Two times a day (BID) | ORAL | 0 refills | Status: AC

## 2023-11-10 MED ORDER — CEFUROXIME AXETIL 250 MG PO TABS
250.0000 mg | ORAL_TABLET | Freq: Once | ORAL | Status: AC
  Administered 2023-11-10: 250 mg via ORAL
  Filled 2023-11-10: qty 1

## 2023-11-10 MED ORDER — POTASSIUM CHLORIDE CRYS ER 20 MEQ PO TBCR
40.0000 meq | EXTENDED_RELEASE_TABLET | Freq: Once | ORAL | Status: AC
  Administered 2023-11-10: 40 meq via ORAL
  Filled 2023-11-10: qty 2

## 2023-11-10 NOTE — ED Notes (Signed)
 Pt son Doroteo Gasmen was informed of discharge by phone.

## 2023-11-10 NOTE — ED Triage Notes (Signed)
 First nurse note: Pt to ED via [facility] from home. Nurse aide called EMS. Aide reports pt was bleeding around wound vac on left thigh. EMS did not note any bleeding. Pt has no complaints.   104 SBP to 120 SBP 100% RA 90 HR

## 2023-11-10 NOTE — ED Triage Notes (Addendum)
 Pt arrives via [facility] from home with c/o bleeding from their wounds vac. Pt has had wound vac in place for 3 weeks and is taken care of by a [facility] nurse every 2 days, today they reported seeing blood in the tubing which was not there previously. Per pt this has happened before with prior wound vacs and it will go away. Pt is A&Ox4.  Pt has an indwelling urinary catheter that was placed sometime in March. Urine sample was taken from catheter and urine was cloudy with sediment.

## 2023-11-10 NOTE — Discharge Instructions (Addendum)
 Please take the antibiotics as prescribed, this is to treat a urinary tract infection. Your urine has also been sent for culture, this will provide information about the type of bacteria that is causing the infection.  I do not think there is an infection at the wound vac site. Please follow up with your wound care clinic.

## 2023-11-10 NOTE — ED Notes (Signed)
 Pt moved to bed from wheelchair and wound assessed by PA>

## 2023-11-10 NOTE — ED Provider Notes (Signed)
 [facility] Provider Note    Event Date/Time   First MD Initiated Contact with Patient 11/10/23 1336     (approximate)   History   Wound Check   HPI  Bradley [name] is a 78 y.o. male with PMH of IDA, paralysis of both lower limbs and sacral decubitus ulcer with wound VAC presents for evaluation of wound.  Patient has a home health nurse that comes to change the wound VAC every other day.  Wound nurse noted blood in the tubing and advised patient to come to the emergency department for evaluation.  Patient states that he has had blood in the tubing previously that self resolves.  He does not understand why he needed to come to the emergency department.      Physical Exam   Triage Vital Signs: ED Triage Vitals  Encounter Vitals Group     BP 11/10/23 1237 138/70     Systolic BP Percentile --      Diastolic BP Percentile --      Pulse Rate 11/10/23 1237 76     Resp 11/10/23 1237 16     Temp 11/10/23 1237 98.2 F (36.8 C)     Temp Source 11/10/23 1237 Oral     SpO2 11/10/23 1237 100 %     Weight 11/10/23 1241 213 lb (96.6 kg)     Height 11/10/23 1241 6\' 2"  (1.88 m)     Head Circumference --      Peak Flow --      Pain Score 11/10/23 1240 0     Pain Loc --      Pain Education --      Exclude from Growth Chart --     Most recent vital signs: Vitals:   11/10/23 1237 11/10/23 1637  BP: 138/70 122/88  Pulse: 76 70  Resp: 16 16  Temp: 98.2 F (36.8 C) 98.4 F (36.9 C)  SpO2: 100% 98%   General: Awake, no distress.  CV:  Good peripheral perfusion.  Resp:  Normal effort.  Abd:  No distention.  Other:  Indwelling urinary catheter with leg bag in place, wound vac in place on left gluteus, ostomy bag intact on left lower abdomen.   ED Results / Procedures / Treatments   Labs (all labs ordered are listed, but only abnormal results are displayed) Labs Reviewed  COMPREHENSIVE METABOLIC PANEL WITH GFR - Abnormal; Notable for the following  components:      Result Value   Potassium 2.6 (*)    Chloride 97 (*)    Calcium 8.5 (*)    Total Protein 8.6 (*)    Albumin 2.2 (*)    All other components within normal limits  CBC WITH DIFFERENTIAL/PLATELET - Abnormal; Notable for the following components:   WBC 11.7 (*)    Hemoglobin 9.0 (*)    HCT 30.6 (*)    MCV 68.6 (*)    MCH 20.2 (*)    MCHC 29.4 (*)    RDW 18.1 (*)    Platelets 603 (*)    All other components within normal limits  URINALYSIS, W/ REFLEX TO CULTURE (INFECTION SUSPECTED) - Abnormal; Notable for the following components:   Color, Urine YELLOW (*)    APPearance CLOUDY (*)    Hgb urine dipstick SMALL (*)    Protein, ur 100 (*)    Leukocytes,Ua LARGE (*)    Bacteria, UA MANY (*)    All other components within normal limits  URINE  CULTURE  MAGNESIUM     EKG  ED provider interpretation: EKG showed NSR without ST depression, U wave, QT prolongation or PVCs.  Vent. rate 80 BPM PR interval 150 ms QRS duration 78 ms QT/QTcB 380/438 ms P-R-T axes 60 7 43   PROCEDURES:  Critical Care performed: No  Procedures   MEDICATIONS ORDERED IN ED: Medications  cefUROXime (CEFTIN) tablet 250 mg (has no administration in time range)  potassium chloride  SA (KLOR-CON  M) CR tablet 40 mEq (40 mEq Oral Given 11/10/23 1444)     IMPRESSION / MDM / ASSESSMENT AND PLAN / ED COURSE  I reviewed the triage vital signs and the nursing notes.                             78 year old male presents for evaluation of his wound VAC.  Vital signs stable patient NAD on exam.  Differential diagnosis includes, but is not limited to, wound infection, wound VAC problem, medical screening exam.  Patient's presentation is most consistent with acute complicated illness / injury requiring diagnostic workup.  Consult placed to wound care.   CBC shows elevated white count and microcytic anemia which is consistent with his baseline.  UA shows small hemoglobin, protein, leukocytes,  greater than 50 WBCs and many bacteria with WBC clumps and mucus.  This was taken directly from patient's catheter bag.  Suspect this is a dirty sample, patient is not having any systemic symptoms like fever, body aches or chills. He has not noticed any increased penile discharge. Urine has been sent for culture, but will cover with oral antibiotics for now.   CMP shows hypokalemia, but magnesium is WNL.  Patient was given 40 mEq of oral potassium.  Explained that I would also recommend IV potassium but patient refused. Did consider admission for hypokalemia and cardiac monitoring but patient would prefer to go home. EKG showed NSR without ST depression, U wave, QT prolongation or PVCs so I feel he would be safe for discharge.  Patient does not want to wait any longer for a wound care evaluation. I visualized the wound vac site and do not feel it is acutely infected as there is no surrounding erythema. Suspect that the blood in the wound vac is part of the healing process.   Patient voiced understanding, all questions were answered and he was stable at discharge.    FINAL CLINICAL IMPRESSION(S) / ED DIAGNOSES   Final diagnoses:  Visit for wound check  Urinary tract infection associated with indwelling urethral catheter, initial encounter (HCC)     Rx / DC Orders   ED Discharge Orders          Ordered    cefpodoxime (VANTIN) 200 MG tablet  2 times daily        11/10/23 1720             Note:  This document was prepared using Dragon voice recognition software and may include unintentional dictation errors.   Phyliss Breen, PA-C 11/10/23 1833    Charleen Conn, MD 11/10/23 1910

## 2023-11-10 NOTE — ED Notes (Signed)
 See triage note   Presents with some bleeding noted in wound vac this am  States he has had this wound vac for about 3 weeks

## 2023-11-13 DIAGNOSIS — N319 Neuromuscular dysfunction of bladder, unspecified: Secondary | ICD-10-CM | POA: Diagnosis not present

## 2023-11-13 DIAGNOSIS — I739 Peripheral vascular disease, unspecified: Secondary | ICD-10-CM | POA: Diagnosis not present

## 2023-11-13 DIAGNOSIS — L89893 Pressure ulcer of other site, stage 3: Secondary | ICD-10-CM | POA: Diagnosis not present

## 2023-11-13 DIAGNOSIS — L89154 Pressure ulcer of sacral region, stage 4: Secondary | ICD-10-CM | POA: Diagnosis not present

## 2023-11-13 DIAGNOSIS — I10 Essential (primary) hypertension: Secondary | ICD-10-CM | POA: Diagnosis not present

## 2023-11-13 DIAGNOSIS — E46 Unspecified protein-calorie malnutrition: Secondary | ICD-10-CM | POA: Diagnosis not present

## 2023-11-13 DIAGNOSIS — L89894 Pressure ulcer of other site, stage 4: Secondary | ICD-10-CM | POA: Diagnosis not present

## 2023-11-13 DIAGNOSIS — D509 Iron deficiency anemia, unspecified: Secondary | ICD-10-CM | POA: Diagnosis not present

## 2023-11-13 DIAGNOSIS — L89613 Pressure ulcer of right heel, stage 3: Secondary | ICD-10-CM | POA: Diagnosis not present

## 2023-11-13 LAB — URINE CULTURE: Culture: >=100000 — AB

## 2023-11-15 DIAGNOSIS — L89893 Pressure ulcer of other site, stage 3: Secondary | ICD-10-CM | POA: Diagnosis not present

## 2023-11-15 DIAGNOSIS — E46 Unspecified protein-calorie malnutrition: Secondary | ICD-10-CM | POA: Diagnosis not present

## 2023-11-15 DIAGNOSIS — N319 Neuromuscular dysfunction of bladder, unspecified: Secondary | ICD-10-CM | POA: Diagnosis not present

## 2023-11-15 DIAGNOSIS — I739 Peripheral vascular disease, unspecified: Secondary | ICD-10-CM | POA: Diagnosis not present

## 2023-11-15 DIAGNOSIS — D509 Iron deficiency anemia, unspecified: Secondary | ICD-10-CM | POA: Diagnosis not present

## 2023-11-15 DIAGNOSIS — L89613 Pressure ulcer of right heel, stage 3: Secondary | ICD-10-CM | POA: Diagnosis not present

## 2023-11-15 DIAGNOSIS — L89154 Pressure ulcer of sacral region, stage 4: Secondary | ICD-10-CM | POA: Diagnosis not present

## 2023-11-15 DIAGNOSIS — L89894 Pressure ulcer of other site, stage 4: Secondary | ICD-10-CM | POA: Diagnosis not present

## 2023-11-15 DIAGNOSIS — I10 Essential (primary) hypertension: Secondary | ICD-10-CM | POA: Diagnosis not present

## 2023-11-16 ENCOUNTER — Encounter: Attending: Physician Assistant | Admitting: Physician Assistant

## 2023-11-16 DIAGNOSIS — G8220 Paraplegia, unspecified: Secondary | ICD-10-CM | POA: Diagnosis not present

## 2023-11-16 DIAGNOSIS — L89894 Pressure ulcer of other site, stage 4: Secondary | ICD-10-CM | POA: Diagnosis not present

## 2023-11-16 DIAGNOSIS — M868X8 Other osteomyelitis, other site: Secondary | ICD-10-CM | POA: Diagnosis not present

## 2023-11-16 DIAGNOSIS — I7389 Other specified peripheral vascular diseases: Secondary | ICD-10-CM | POA: Diagnosis not present

## 2023-11-16 DIAGNOSIS — L89513 Pressure ulcer of right ankle, stage 3: Secondary | ICD-10-CM | POA: Diagnosis not present

## 2023-11-16 DIAGNOSIS — L89154 Pressure ulcer of sacral region, stage 4: Secondary | ICD-10-CM | POA: Insufficient documentation

## 2023-11-16 DIAGNOSIS — L89324 Pressure ulcer of left buttock, stage 4: Secondary | ICD-10-CM | POA: Diagnosis not present

## 2023-11-17 DIAGNOSIS — I10 Essential (primary) hypertension: Secondary | ICD-10-CM | POA: Diagnosis not present

## 2023-11-17 DIAGNOSIS — E46 Unspecified protein-calorie malnutrition: Secondary | ICD-10-CM | POA: Diagnosis not present

## 2023-11-17 DIAGNOSIS — L89893 Pressure ulcer of other site, stage 3: Secondary | ICD-10-CM | POA: Diagnosis not present

## 2023-11-17 DIAGNOSIS — I739 Peripheral vascular disease, unspecified: Secondary | ICD-10-CM | POA: Diagnosis not present

## 2023-11-17 DIAGNOSIS — N319 Neuromuscular dysfunction of bladder, unspecified: Secondary | ICD-10-CM | POA: Diagnosis not present

## 2023-11-17 DIAGNOSIS — L89154 Pressure ulcer of sacral region, stage 4: Secondary | ICD-10-CM | POA: Diagnosis not present

## 2023-11-17 DIAGNOSIS — D509 Iron deficiency anemia, unspecified: Secondary | ICD-10-CM | POA: Diagnosis not present

## 2023-11-17 DIAGNOSIS — L89613 Pressure ulcer of right heel, stage 3: Secondary | ICD-10-CM | POA: Diagnosis not present

## 2023-11-17 DIAGNOSIS — L89894 Pressure ulcer of other site, stage 4: Secondary | ICD-10-CM | POA: Diagnosis not present

## 2023-11-20 DIAGNOSIS — D509 Iron deficiency anemia, unspecified: Secondary | ICD-10-CM | POA: Diagnosis not present

## 2023-11-20 DIAGNOSIS — L89893 Pressure ulcer of other site, stage 3: Secondary | ICD-10-CM | POA: Diagnosis not present

## 2023-11-20 DIAGNOSIS — L89894 Pressure ulcer of other site, stage 4: Secondary | ICD-10-CM | POA: Diagnosis not present

## 2023-11-20 DIAGNOSIS — E46 Unspecified protein-calorie malnutrition: Secondary | ICD-10-CM | POA: Diagnosis not present

## 2023-11-20 DIAGNOSIS — L89613 Pressure ulcer of right heel, stage 3: Secondary | ICD-10-CM | POA: Diagnosis not present

## 2023-11-20 DIAGNOSIS — N319 Neuromuscular dysfunction of bladder, unspecified: Secondary | ICD-10-CM | POA: Diagnosis not present

## 2023-11-20 DIAGNOSIS — I739 Peripheral vascular disease, unspecified: Secondary | ICD-10-CM | POA: Diagnosis not present

## 2023-11-20 DIAGNOSIS — L89154 Pressure ulcer of sacral region, stage 4: Secondary | ICD-10-CM | POA: Diagnosis not present

## 2023-11-20 DIAGNOSIS — I10 Essential (primary) hypertension: Secondary | ICD-10-CM | POA: Diagnosis not present

## 2023-11-22 DIAGNOSIS — N319 Neuromuscular dysfunction of bladder, unspecified: Secondary | ICD-10-CM | POA: Diagnosis not present

## 2023-11-22 DIAGNOSIS — L89613 Pressure ulcer of right heel, stage 3: Secondary | ICD-10-CM | POA: Diagnosis not present

## 2023-11-22 DIAGNOSIS — L89894 Pressure ulcer of other site, stage 4: Secondary | ICD-10-CM | POA: Diagnosis not present

## 2023-11-22 DIAGNOSIS — I10 Essential (primary) hypertension: Secondary | ICD-10-CM | POA: Diagnosis not present

## 2023-11-22 DIAGNOSIS — D509 Iron deficiency anemia, unspecified: Secondary | ICD-10-CM | POA: Diagnosis not present

## 2023-11-22 DIAGNOSIS — L89893 Pressure ulcer of other site, stage 3: Secondary | ICD-10-CM | POA: Diagnosis not present

## 2023-11-22 DIAGNOSIS — I739 Peripheral vascular disease, unspecified: Secondary | ICD-10-CM | POA: Diagnosis not present

## 2023-11-22 DIAGNOSIS — E46 Unspecified protein-calorie malnutrition: Secondary | ICD-10-CM | POA: Diagnosis not present

## 2023-11-22 DIAGNOSIS — L89154 Pressure ulcer of sacral region, stage 4: Secondary | ICD-10-CM | POA: Diagnosis not present

## 2023-11-24 DIAGNOSIS — E46 Unspecified protein-calorie malnutrition: Secondary | ICD-10-CM | POA: Diagnosis not present

## 2023-11-24 DIAGNOSIS — L89154 Pressure ulcer of sacral region, stage 4: Secondary | ICD-10-CM | POA: Diagnosis not present

## 2023-11-24 DIAGNOSIS — I739 Peripheral vascular disease, unspecified: Secondary | ICD-10-CM | POA: Diagnosis not present

## 2023-11-24 DIAGNOSIS — I10 Essential (primary) hypertension: Secondary | ICD-10-CM | POA: Diagnosis not present

## 2023-11-24 DIAGNOSIS — L89893 Pressure ulcer of other site, stage 3: Secondary | ICD-10-CM | POA: Diagnosis not present

## 2023-11-24 DIAGNOSIS — N319 Neuromuscular dysfunction of bladder, unspecified: Secondary | ICD-10-CM | POA: Diagnosis not present

## 2023-11-24 DIAGNOSIS — D509 Iron deficiency anemia, unspecified: Secondary | ICD-10-CM | POA: Diagnosis not present

## 2023-11-24 DIAGNOSIS — L89894 Pressure ulcer of other site, stage 4: Secondary | ICD-10-CM | POA: Diagnosis not present

## 2023-11-24 DIAGNOSIS — L89613 Pressure ulcer of right heel, stage 3: Secondary | ICD-10-CM | POA: Diagnosis not present

## 2023-11-27 DIAGNOSIS — L89892 Pressure ulcer of other site, stage 2: Secondary | ICD-10-CM | POA: Diagnosis not present

## 2023-11-27 DIAGNOSIS — L89894 Pressure ulcer of other site, stage 4: Secondary | ICD-10-CM | POA: Diagnosis not present

## 2023-11-27 DIAGNOSIS — I739 Peripheral vascular disease, unspecified: Secondary | ICD-10-CM | POA: Diagnosis not present

## 2023-11-27 DIAGNOSIS — M8668 Other chronic osteomyelitis, other site: Secondary | ICD-10-CM | POA: Diagnosis not present

## 2023-11-27 DIAGNOSIS — G8220 Paraplegia, unspecified: Secondary | ICD-10-CM | POA: Diagnosis not present

## 2023-11-27 DIAGNOSIS — I10 Essential (primary) hypertension: Secondary | ICD-10-CM | POA: Diagnosis not present

## 2023-11-27 DIAGNOSIS — L89154 Pressure ulcer of sacral region, stage 4: Secondary | ICD-10-CM | POA: Diagnosis not present

## 2023-11-27 DIAGNOSIS — S81802D Unspecified open wound, left lower leg, subsequent encounter: Secondary | ICD-10-CM | POA: Diagnosis not present

## 2023-11-27 DIAGNOSIS — L89893 Pressure ulcer of other site, stage 3: Secondary | ICD-10-CM | POA: Diagnosis not present

## 2023-11-29 ENCOUNTER — Encounter: Admitting: Physician Assistant

## 2023-11-29 DIAGNOSIS — G8220 Paraplegia, unspecified: Secondary | ICD-10-CM | POA: Diagnosis not present

## 2023-11-29 DIAGNOSIS — I7389 Other specified peripheral vascular diseases: Secondary | ICD-10-CM | POA: Diagnosis not present

## 2023-11-29 DIAGNOSIS — L89154 Pressure ulcer of sacral region, stage 4: Secondary | ICD-10-CM | POA: Diagnosis not present

## 2023-11-29 DIAGNOSIS — L89513 Pressure ulcer of right ankle, stage 3: Secondary | ICD-10-CM | POA: Diagnosis not present

## 2023-11-29 DIAGNOSIS — M868X8 Other osteomyelitis, other site: Secondary | ICD-10-CM | POA: Diagnosis not present

## 2023-11-29 DIAGNOSIS — L89324 Pressure ulcer of left buttock, stage 4: Secondary | ICD-10-CM | POA: Diagnosis not present

## 2023-11-29 DIAGNOSIS — L89894 Pressure ulcer of other site, stage 4: Secondary | ICD-10-CM | POA: Diagnosis not present

## 2023-12-01 DIAGNOSIS — I10 Essential (primary) hypertension: Secondary | ICD-10-CM | POA: Diagnosis not present

## 2023-12-01 DIAGNOSIS — S81802D Unspecified open wound, left lower leg, subsequent encounter: Secondary | ICD-10-CM | POA: Diagnosis not present

## 2023-12-01 DIAGNOSIS — M8668 Other chronic osteomyelitis, other site: Secondary | ICD-10-CM | POA: Diagnosis not present

## 2023-12-01 DIAGNOSIS — I739 Peripheral vascular disease, unspecified: Secondary | ICD-10-CM | POA: Diagnosis not present

## 2023-12-01 DIAGNOSIS — L89892 Pressure ulcer of other site, stage 2: Secondary | ICD-10-CM | POA: Diagnosis not present

## 2023-12-01 DIAGNOSIS — L89154 Pressure ulcer of sacral region, stage 4: Secondary | ICD-10-CM | POA: Diagnosis not present

## 2023-12-01 DIAGNOSIS — L89893 Pressure ulcer of other site, stage 3: Secondary | ICD-10-CM | POA: Diagnosis not present

## 2023-12-01 DIAGNOSIS — L89894 Pressure ulcer of other site, stage 4: Secondary | ICD-10-CM | POA: Diagnosis not present

## 2023-12-01 DIAGNOSIS — G8220 Paraplegia, unspecified: Secondary | ICD-10-CM | POA: Diagnosis not present

## 2023-12-04 DIAGNOSIS — M8668 Other chronic osteomyelitis, other site: Secondary | ICD-10-CM | POA: Diagnosis not present

## 2023-12-04 DIAGNOSIS — I10 Essential (primary) hypertension: Secondary | ICD-10-CM | POA: Diagnosis not present

## 2023-12-04 DIAGNOSIS — L89894 Pressure ulcer of other site, stage 4: Secondary | ICD-10-CM | POA: Diagnosis not present

## 2023-12-04 DIAGNOSIS — L89892 Pressure ulcer of other site, stage 2: Secondary | ICD-10-CM | POA: Diagnosis not present

## 2023-12-04 DIAGNOSIS — S81802D Unspecified open wound, left lower leg, subsequent encounter: Secondary | ICD-10-CM | POA: Diagnosis not present

## 2023-12-04 DIAGNOSIS — I739 Peripheral vascular disease, unspecified: Secondary | ICD-10-CM | POA: Diagnosis not present

## 2023-12-04 DIAGNOSIS — L89154 Pressure ulcer of sacral region, stage 4: Secondary | ICD-10-CM | POA: Diagnosis not present

## 2023-12-04 DIAGNOSIS — G8220 Paraplegia, unspecified: Secondary | ICD-10-CM | POA: Diagnosis not present

## 2023-12-04 DIAGNOSIS — L89893 Pressure ulcer of other site, stage 3: Secondary | ICD-10-CM | POA: Diagnosis not present

## 2023-12-06 DIAGNOSIS — L89892 Pressure ulcer of other site, stage 2: Secondary | ICD-10-CM | POA: Diagnosis not present

## 2023-12-06 DIAGNOSIS — L89154 Pressure ulcer of sacral region, stage 4: Secondary | ICD-10-CM | POA: Diagnosis not present

## 2023-12-06 DIAGNOSIS — I739 Peripheral vascular disease, unspecified: Secondary | ICD-10-CM | POA: Diagnosis not present

## 2023-12-06 DIAGNOSIS — S81802D Unspecified open wound, left lower leg, subsequent encounter: Secondary | ICD-10-CM | POA: Diagnosis not present

## 2023-12-06 DIAGNOSIS — I10 Essential (primary) hypertension: Secondary | ICD-10-CM | POA: Diagnosis not present

## 2023-12-06 DIAGNOSIS — G8220 Paraplegia, unspecified: Secondary | ICD-10-CM | POA: Diagnosis not present

## 2023-12-06 DIAGNOSIS — L89894 Pressure ulcer of other site, stage 4: Secondary | ICD-10-CM | POA: Diagnosis not present

## 2023-12-06 DIAGNOSIS — L89893 Pressure ulcer of other site, stage 3: Secondary | ICD-10-CM | POA: Diagnosis not present

## 2023-12-06 DIAGNOSIS — M8668 Other chronic osteomyelitis, other site: Secondary | ICD-10-CM | POA: Diagnosis not present

## 2023-12-08 DIAGNOSIS — I739 Peripheral vascular disease, unspecified: Secondary | ICD-10-CM | POA: Diagnosis not present

## 2023-12-08 DIAGNOSIS — L89154 Pressure ulcer of sacral region, stage 4: Secondary | ICD-10-CM | POA: Diagnosis not present

## 2023-12-08 DIAGNOSIS — S81802D Unspecified open wound, left lower leg, subsequent encounter: Secondary | ICD-10-CM | POA: Diagnosis not present

## 2023-12-08 DIAGNOSIS — I10 Essential (primary) hypertension: Secondary | ICD-10-CM | POA: Diagnosis not present

## 2023-12-08 DIAGNOSIS — M8668 Other chronic osteomyelitis, other site: Secondary | ICD-10-CM | POA: Diagnosis not present

## 2023-12-08 DIAGNOSIS — L89894 Pressure ulcer of other site, stage 4: Secondary | ICD-10-CM | POA: Diagnosis not present

## 2023-12-08 DIAGNOSIS — L89893 Pressure ulcer of other site, stage 3: Secondary | ICD-10-CM | POA: Diagnosis not present

## 2023-12-08 DIAGNOSIS — L89892 Pressure ulcer of other site, stage 2: Secondary | ICD-10-CM | POA: Diagnosis not present

## 2023-12-08 DIAGNOSIS — G8220 Paraplegia, unspecified: Secondary | ICD-10-CM | POA: Diagnosis not present

## 2023-12-11 DIAGNOSIS — L89892 Pressure ulcer of other site, stage 2: Secondary | ICD-10-CM | POA: Diagnosis not present

## 2023-12-11 DIAGNOSIS — L89154 Pressure ulcer of sacral region, stage 4: Secondary | ICD-10-CM | POA: Diagnosis not present

## 2023-12-11 DIAGNOSIS — I10 Essential (primary) hypertension: Secondary | ICD-10-CM | POA: Diagnosis not present

## 2023-12-11 DIAGNOSIS — M8668 Other chronic osteomyelitis, other site: Secondary | ICD-10-CM | POA: Diagnosis not present

## 2023-12-11 DIAGNOSIS — S81802D Unspecified open wound, left lower leg, subsequent encounter: Secondary | ICD-10-CM | POA: Diagnosis not present

## 2023-12-11 DIAGNOSIS — G8220 Paraplegia, unspecified: Secondary | ICD-10-CM | POA: Diagnosis not present

## 2023-12-11 DIAGNOSIS — L89893 Pressure ulcer of other site, stage 3: Secondary | ICD-10-CM | POA: Diagnosis not present

## 2023-12-11 DIAGNOSIS — L89894 Pressure ulcer of other site, stage 4: Secondary | ICD-10-CM | POA: Diagnosis not present

## 2023-12-11 DIAGNOSIS — I739 Peripheral vascular disease, unspecified: Secondary | ICD-10-CM | POA: Diagnosis not present

## 2023-12-13 ENCOUNTER — Encounter: Attending: Physician Assistant | Admitting: Physician Assistant

## 2023-12-13 DIAGNOSIS — L89894 Pressure ulcer of other site, stage 4: Secondary | ICD-10-CM | POA: Diagnosis not present

## 2023-12-13 DIAGNOSIS — L89154 Pressure ulcer of sacral region, stage 4: Secondary | ICD-10-CM | POA: Insufficient documentation

## 2023-12-13 DIAGNOSIS — L89324 Pressure ulcer of left buttock, stage 4: Secondary | ICD-10-CM | POA: Diagnosis not present

## 2023-12-13 DIAGNOSIS — G8220 Paraplegia, unspecified: Secondary | ICD-10-CM | POA: Insufficient documentation

## 2023-12-13 DIAGNOSIS — L89513 Pressure ulcer of right ankle, stage 3: Secondary | ICD-10-CM | POA: Diagnosis not present

## 2023-12-13 DIAGNOSIS — M8668 Other chronic osteomyelitis, other site: Secondary | ICD-10-CM | POA: Insufficient documentation

## 2023-12-13 DIAGNOSIS — I7389 Other specified peripheral vascular diseases: Secondary | ICD-10-CM | POA: Diagnosis not present

## 2023-12-14 DIAGNOSIS — N319 Neuromuscular dysfunction of bladder, unspecified: Secondary | ICD-10-CM | POA: Diagnosis not present

## 2023-12-14 DIAGNOSIS — L89159 Pressure ulcer of sacral region, unspecified stage: Secondary | ICD-10-CM | POA: Diagnosis not present

## 2023-12-14 DIAGNOSIS — L24A9 Irritant contact dermatitis due friction or contact with other specified body fluids: Secondary | ICD-10-CM | POA: Diagnosis not present

## 2023-12-14 DIAGNOSIS — S81802A Unspecified open wound, left lower leg, initial encounter: Secondary | ICD-10-CM | POA: Diagnosis not present

## 2023-12-14 DIAGNOSIS — Z933 Colostomy status: Secondary | ICD-10-CM | POA: Diagnosis not present

## 2023-12-14 DIAGNOSIS — K631 Perforation of intestine (nontraumatic): Secondary | ICD-10-CM | POA: Diagnosis not present

## 2023-12-14 DIAGNOSIS — S81809A Unspecified open wound, unspecified lower leg, initial encounter: Secondary | ICD-10-CM | POA: Diagnosis not present

## 2023-12-14 DIAGNOSIS — L89154 Pressure ulcer of sacral region, stage 4: Secondary | ICD-10-CM | POA: Diagnosis not present

## 2023-12-14 DIAGNOSIS — S31000A Unspecified open wound of lower back and pelvis without penetration into retroperitoneum, initial encounter: Secondary | ICD-10-CM | POA: Diagnosis not present

## 2023-12-15 DIAGNOSIS — G8220 Paraplegia, unspecified: Secondary | ICD-10-CM | POA: Diagnosis not present

## 2023-12-15 DIAGNOSIS — I10 Essential (primary) hypertension: Secondary | ICD-10-CM | POA: Diagnosis not present

## 2023-12-15 DIAGNOSIS — L89894 Pressure ulcer of other site, stage 4: Secondary | ICD-10-CM | POA: Diagnosis not present

## 2023-12-15 DIAGNOSIS — L89892 Pressure ulcer of other site, stage 2: Secondary | ICD-10-CM | POA: Diagnosis not present

## 2023-12-15 DIAGNOSIS — M8668 Other chronic osteomyelitis, other site: Secondary | ICD-10-CM | POA: Diagnosis not present

## 2023-12-15 DIAGNOSIS — L89893 Pressure ulcer of other site, stage 3: Secondary | ICD-10-CM | POA: Diagnosis not present

## 2023-12-15 DIAGNOSIS — S81802D Unspecified open wound, left lower leg, subsequent encounter: Secondary | ICD-10-CM | POA: Diagnosis not present

## 2023-12-15 DIAGNOSIS — I739 Peripheral vascular disease, unspecified: Secondary | ICD-10-CM | POA: Diagnosis not present

## 2023-12-15 DIAGNOSIS — L89154 Pressure ulcer of sacral region, stage 4: Secondary | ICD-10-CM | POA: Diagnosis not present

## 2023-12-18 DIAGNOSIS — L89154 Pressure ulcer of sacral region, stage 4: Secondary | ICD-10-CM | POA: Diagnosis not present

## 2023-12-18 DIAGNOSIS — L89893 Pressure ulcer of other site, stage 3: Secondary | ICD-10-CM | POA: Diagnosis not present

## 2023-12-18 DIAGNOSIS — M8668 Other chronic osteomyelitis, other site: Secondary | ICD-10-CM | POA: Diagnosis not present

## 2023-12-18 DIAGNOSIS — L89892 Pressure ulcer of other site, stage 2: Secondary | ICD-10-CM | POA: Diagnosis not present

## 2023-12-18 DIAGNOSIS — G8220 Paraplegia, unspecified: Secondary | ICD-10-CM | POA: Diagnosis not present

## 2023-12-18 DIAGNOSIS — L89894 Pressure ulcer of other site, stage 4: Secondary | ICD-10-CM | POA: Diagnosis not present

## 2023-12-18 DIAGNOSIS — S81802D Unspecified open wound, left lower leg, subsequent encounter: Secondary | ICD-10-CM | POA: Diagnosis not present

## 2023-12-18 DIAGNOSIS — I739 Peripheral vascular disease, unspecified: Secondary | ICD-10-CM | POA: Diagnosis not present

## 2023-12-18 DIAGNOSIS — I10 Essential (primary) hypertension: Secondary | ICD-10-CM | POA: Diagnosis not present

## 2023-12-20 DIAGNOSIS — M8668 Other chronic osteomyelitis, other site: Secondary | ICD-10-CM | POA: Diagnosis not present

## 2023-12-20 DIAGNOSIS — L89894 Pressure ulcer of other site, stage 4: Secondary | ICD-10-CM | POA: Diagnosis not present

## 2023-12-20 DIAGNOSIS — L89892 Pressure ulcer of other site, stage 2: Secondary | ICD-10-CM | POA: Diagnosis not present

## 2023-12-20 DIAGNOSIS — S81802D Unspecified open wound, left lower leg, subsequent encounter: Secondary | ICD-10-CM | POA: Diagnosis not present

## 2023-12-20 DIAGNOSIS — L89893 Pressure ulcer of other site, stage 3: Secondary | ICD-10-CM | POA: Diagnosis not present

## 2023-12-20 DIAGNOSIS — G8220 Paraplegia, unspecified: Secondary | ICD-10-CM | POA: Diagnosis not present

## 2023-12-20 DIAGNOSIS — I10 Essential (primary) hypertension: Secondary | ICD-10-CM | POA: Diagnosis not present

## 2023-12-20 DIAGNOSIS — I739 Peripheral vascular disease, unspecified: Secondary | ICD-10-CM | POA: Diagnosis not present

## 2023-12-20 DIAGNOSIS — L89154 Pressure ulcer of sacral region, stage 4: Secondary | ICD-10-CM | POA: Diagnosis not present

## 2023-12-22 DIAGNOSIS — L89893 Pressure ulcer of other site, stage 3: Secondary | ICD-10-CM | POA: Diagnosis not present

## 2023-12-22 DIAGNOSIS — M8668 Other chronic osteomyelitis, other site: Secondary | ICD-10-CM | POA: Diagnosis not present

## 2023-12-22 DIAGNOSIS — L89894 Pressure ulcer of other site, stage 4: Secondary | ICD-10-CM | POA: Diagnosis not present

## 2023-12-22 DIAGNOSIS — I10 Essential (primary) hypertension: Secondary | ICD-10-CM | POA: Diagnosis not present

## 2023-12-22 DIAGNOSIS — I739 Peripheral vascular disease, unspecified: Secondary | ICD-10-CM | POA: Diagnosis not present

## 2023-12-22 DIAGNOSIS — G8220 Paraplegia, unspecified: Secondary | ICD-10-CM | POA: Diagnosis not present

## 2023-12-22 DIAGNOSIS — L89892 Pressure ulcer of other site, stage 2: Secondary | ICD-10-CM | POA: Diagnosis not present

## 2023-12-22 DIAGNOSIS — L89154 Pressure ulcer of sacral region, stage 4: Secondary | ICD-10-CM | POA: Diagnosis not present

## 2023-12-22 DIAGNOSIS — S81802D Unspecified open wound, left lower leg, subsequent encounter: Secondary | ICD-10-CM | POA: Diagnosis not present

## 2023-12-25 DIAGNOSIS — L89893 Pressure ulcer of other site, stage 3: Secondary | ICD-10-CM | POA: Diagnosis not present

## 2023-12-25 DIAGNOSIS — L89154 Pressure ulcer of sacral region, stage 4: Secondary | ICD-10-CM | POA: Diagnosis not present

## 2023-12-25 DIAGNOSIS — S81802D Unspecified open wound, left lower leg, subsequent encounter: Secondary | ICD-10-CM | POA: Diagnosis not present

## 2023-12-25 DIAGNOSIS — G8220 Paraplegia, unspecified: Secondary | ICD-10-CM | POA: Diagnosis not present

## 2023-12-25 DIAGNOSIS — L89892 Pressure ulcer of other site, stage 2: Secondary | ICD-10-CM | POA: Diagnosis not present

## 2023-12-25 DIAGNOSIS — I739 Peripheral vascular disease, unspecified: Secondary | ICD-10-CM | POA: Diagnosis not present

## 2023-12-25 DIAGNOSIS — I10 Essential (primary) hypertension: Secondary | ICD-10-CM | POA: Diagnosis not present

## 2023-12-25 DIAGNOSIS — M8668 Other chronic osteomyelitis, other site: Secondary | ICD-10-CM | POA: Diagnosis not present

## 2023-12-25 DIAGNOSIS — L89894 Pressure ulcer of other site, stage 4: Secondary | ICD-10-CM | POA: Diagnosis not present

## 2023-12-27 ENCOUNTER — Encounter: Admitting: Physician Assistant

## 2023-12-27 DIAGNOSIS — L89513 Pressure ulcer of right ankle, stage 3: Secondary | ICD-10-CM | POA: Diagnosis not present

## 2023-12-27 DIAGNOSIS — L89154 Pressure ulcer of sacral region, stage 4: Secondary | ICD-10-CM | POA: Diagnosis not present

## 2023-12-27 DIAGNOSIS — G8220 Paraplegia, unspecified: Secondary | ICD-10-CM | POA: Diagnosis not present

## 2023-12-27 DIAGNOSIS — L89324 Pressure ulcer of left buttock, stage 4: Secondary | ICD-10-CM | POA: Diagnosis not present

## 2023-12-27 DIAGNOSIS — I7389 Other specified peripheral vascular diseases: Secondary | ICD-10-CM | POA: Diagnosis not present

## 2023-12-27 DIAGNOSIS — M8668 Other chronic osteomyelitis, other site: Secondary | ICD-10-CM | POA: Diagnosis not present

## 2023-12-27 DIAGNOSIS — L89894 Pressure ulcer of other site, stage 4: Secondary | ICD-10-CM | POA: Diagnosis not present

## 2023-12-28 DIAGNOSIS — D509 Iron deficiency anemia, unspecified: Secondary | ICD-10-CM | POA: Diagnosis not present

## 2023-12-29 DIAGNOSIS — L89894 Pressure ulcer of other site, stage 4: Secondary | ICD-10-CM | POA: Diagnosis not present

## 2023-12-29 DIAGNOSIS — I10 Essential (primary) hypertension: Secondary | ICD-10-CM | POA: Diagnosis not present

## 2023-12-29 DIAGNOSIS — S81802D Unspecified open wound, left lower leg, subsequent encounter: Secondary | ICD-10-CM | POA: Diagnosis not present

## 2023-12-29 DIAGNOSIS — M8668 Other chronic osteomyelitis, other site: Secondary | ICD-10-CM | POA: Diagnosis not present

## 2023-12-29 DIAGNOSIS — I739 Peripheral vascular disease, unspecified: Secondary | ICD-10-CM | POA: Diagnosis not present

## 2023-12-29 DIAGNOSIS — L89892 Pressure ulcer of other site, stage 2: Secondary | ICD-10-CM | POA: Diagnosis not present

## 2023-12-29 DIAGNOSIS — L89154 Pressure ulcer of sacral region, stage 4: Secondary | ICD-10-CM | POA: Diagnosis not present

## 2023-12-29 DIAGNOSIS — G8220 Paraplegia, unspecified: Secondary | ICD-10-CM | POA: Diagnosis not present

## 2023-12-29 DIAGNOSIS — L89893 Pressure ulcer of other site, stage 3: Secondary | ICD-10-CM | POA: Diagnosis not present

## 2024-01-01 ENCOUNTER — Telehealth: Payer: Self-pay | Admitting: *Deleted

## 2024-01-01 DIAGNOSIS — D509 Iron deficiency anemia, unspecified: Secondary | ICD-10-CM | POA: Diagnosis not present

## 2024-01-01 DIAGNOSIS — E876 Hypokalemia: Secondary | ICD-10-CM | POA: Diagnosis not present

## 2024-01-01 DIAGNOSIS — D508 Other iron deficiency anemias: Secondary | ICD-10-CM | POA: Diagnosis not present

## 2024-01-01 DIAGNOSIS — I10 Essential (primary) hypertension: Secondary | ICD-10-CM | POA: Diagnosis not present
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
## 2024-01-01 NOTE — Telephone Encounter (Signed)
 Bradley [name] is calling for Dr. Alla and wanted to see how quickly they could get set back up to getting IV iron  based on his numbers dropping.  I told with the mom already has done the labs for anemia and it should be ready by the end of this day .you can look at them and see if he needs IV iron  . He will need an appt to com back to [place].

## 2024-01-02 ENCOUNTER — Encounter: Payer: Self-pay | Admitting: Oncology

## 2024-01-02 ENCOUNTER — Other Ambulatory Visit: Payer: Self-pay

## 2024-01-02 DIAGNOSIS — D508 Other iron deficiency anemias: Secondary | ICD-10-CM

## 2024-01-02 NOTE — Telephone Encounter (Signed)
 PCP would like pt to be seen sonner due to abnomal labs.   Please scheudle this week or next & notify pt of appt details:   Labs D1 MD/ +/- blood or venofer  D2

## 2024-01-03 DIAGNOSIS — L89892 Pressure ulcer of other site, stage 2: Secondary | ICD-10-CM | POA: Diagnosis not present

## 2024-01-03 DIAGNOSIS — S81802D Unspecified open wound, left lower leg, subsequent encounter: Secondary | ICD-10-CM | POA: Diagnosis not present

## 2024-01-03 DIAGNOSIS — G8220 Paraplegia, unspecified: Secondary | ICD-10-CM | POA: Diagnosis not present

## 2024-01-03 DIAGNOSIS — I739 Peripheral vascular disease, unspecified: Secondary | ICD-10-CM | POA: Diagnosis not present

## 2024-01-03 DIAGNOSIS — I10 Essential (primary) hypertension: Secondary | ICD-10-CM | POA: Diagnosis not present

## 2024-01-03 DIAGNOSIS — L89154 Pressure ulcer of sacral region, stage 4: Secondary | ICD-10-CM | POA: Diagnosis not present

## 2024-01-03 DIAGNOSIS — L89893 Pressure ulcer of other site, stage 3: Secondary | ICD-10-CM | POA: Diagnosis not present

## 2024-01-03 DIAGNOSIS — L89894 Pressure ulcer of other site, stage 4: Secondary | ICD-10-CM | POA: Diagnosis not present

## 2024-01-03 DIAGNOSIS — M8668 Other chronic osteomyelitis, other site: Secondary | ICD-10-CM | POA: Diagnosis not present

## 2024-01-05 DIAGNOSIS — L89893 Pressure ulcer of other site, stage 3: Secondary | ICD-10-CM | POA: Diagnosis not present

## 2024-01-05 DIAGNOSIS — L89892 Pressure ulcer of other site, stage 2: Secondary | ICD-10-CM | POA: Diagnosis not present

## 2024-01-05 DIAGNOSIS — G8220 Paraplegia, unspecified: Secondary | ICD-10-CM | POA: Diagnosis not present

## 2024-01-05 DIAGNOSIS — I739 Peripheral vascular disease, unspecified: Secondary | ICD-10-CM | POA: Diagnosis not present

## 2024-01-05 DIAGNOSIS — L89154 Pressure ulcer of sacral region, stage 4: Secondary | ICD-10-CM | POA: Diagnosis not present

## 2024-01-05 DIAGNOSIS — M8668 Other chronic osteomyelitis, other site: Secondary | ICD-10-CM | POA: Diagnosis not present

## 2024-01-05 DIAGNOSIS — S81802D Unspecified open wound, left lower leg, subsequent encounter: Secondary | ICD-10-CM | POA: Diagnosis not present

## 2024-01-05 DIAGNOSIS — L89894 Pressure ulcer of other site, stage 4: Secondary | ICD-10-CM | POA: Diagnosis not present

## 2024-01-05 DIAGNOSIS — I10 Essential (primary) hypertension: Secondary | ICD-10-CM | POA: Diagnosis not present

## 2024-01-06 ENCOUNTER — Emergency Department
Admission: EM | Admit: 2024-01-06 | Discharge: 2024-01-06 | Disposition: A | Discharge status: Home / Self Care | Attending: Emergency Medicine | Admitting: Emergency Medicine

## 2024-01-06 ENCOUNTER — Other Ambulatory Visit: Payer: Self-pay

## 2024-01-06 ENCOUNTER — Emergency Department

## 2024-01-06 DIAGNOSIS — Z933 Colostomy status: Secondary | ICD-10-CM | POA: Insufficient documentation

## 2024-01-06 DIAGNOSIS — R1084 Generalized abdominal pain: Secondary | ICD-10-CM | POA: Insufficient documentation

## 2024-01-06 DIAGNOSIS — R112 Nausea with vomiting, unspecified: Secondary | ICD-10-CM | POA: Diagnosis not present

## 2024-01-06 DIAGNOSIS — R599 Enlarged lymph nodes, unspecified: Secondary | ICD-10-CM | POA: Diagnosis not present

## 2024-01-06 DIAGNOSIS — K429 Umbilical hernia without obstruction or gangrene: Secondary | ICD-10-CM | POA: Diagnosis not present

## 2024-01-06 DIAGNOSIS — R109 Unspecified abdominal pain: Secondary | ICD-10-CM | POA: Diagnosis not present

## 2024-01-06 LAB — URINALYSIS, COMPLETE (UACMP) WITH MICROSCOPIC
Bacteria, UA: NONE SEEN
Bilirubin Urine: NEGATIVE
Glucose, UA: NEGATIVE mg/dL
Hgb urine dipstick: NEGATIVE
Ketones, ur: NEGATIVE mg/dL
Nitrite: NEGATIVE
Protein, ur: 30 mg/dL — AB
Specific Gravity, Urine: >1.046 — ABNORMAL HIGH (ref 1.005–1.030)
WBC, UA: >50 WBC/hpf (ref 0–5)
pH: 9 — ABNORMAL HIGH (ref 5.0–8.0)

## 2024-01-06 LAB — COMPREHENSIVE METABOLIC PANEL WITH GFR
ALT: <5 U/L (ref 0–44)
AST: 17 U/L (ref 15–41)
Albumin: 2.4 g/dL — ABNORMAL LOW (ref 3.5–5.0)
Alkaline Phosphatase: 71 U/L (ref 38–126)
Anion gap: 12 (ref 5–15)
BUN: 11 mg/dL (ref 8–23)
CO2: 28 mmol/L (ref 22–32)
Calcium: 8.7 mg/dL — ABNORMAL LOW (ref 8.9–10.3)
Chloride: 100 mmol/L (ref 98–111)
Creatinine, Ser: 0.43 mg/dL — ABNORMAL LOW (ref 0.61–1.24)
GFR, Estimated: >60 mL/min (ref >60)
Glucose, Bld: 95 mg/dL (ref 70–99)
Potassium: 3.7 mmol/L (ref 3.5–5.1)
Sodium: 140 mmol/L (ref 135–145)
Total Bilirubin: 0.3 mg/dL (ref 0.0–1.2)
Total Protein: 8.7 g/dL — ABNORMAL HIGH (ref 6.5–8.1)

## 2024-01-06 LAB — CBC
HCT: 29.9 % — ABNORMAL LOW (ref 39.0–52.0)
Hemoglobin: 8.6 g/dL — ABNORMAL LOW (ref 13.0–17.0)
MCH: 20 pg — ABNORMAL LOW (ref 26.0–34.0)
MCHC: 28.8 g/dL — ABNORMAL LOW (ref 30.0–36.0)
MCV: 69.4 fL — ABNORMAL LOW (ref 80.0–100.0)
Platelets: 579 K/uL — ABNORMAL HIGH (ref 150–400)
RBC: 4.31 MIL/uL (ref 4.22–5.81)
RDW: 19.6 % — ABNORMAL HIGH (ref 11.5–15.5)
WBC: 7.8 K/uL (ref 4.0–10.5)
nRBC: 0 % (ref 0.0–0.2)

## 2024-01-06 LAB — LIPASE, BLOOD: Lipase: 26 U/L (ref 11–51)

## 2024-01-06 MED ORDER — ONDANSETRON 4 MG PO TBDP: 4.0000 mg | ORAL_TABLET | Freq: Three times a day (TID) | ORAL | 0 refills | Status: AC | PRN

## 2024-01-06 MED ORDER — ALUM & MAG HYDROXIDE-SIMETH 200-200-20 MG/5ML PO SUSP
30.0000 mL | Freq: Once | ORAL | Status: AC
  Administered 2024-01-06: 30 mL via ORAL
  Filled 2024-01-06: qty 30

## 2024-01-06 MED ORDER — MORPHINE SULFATE (PF) 4 MG/ML IV SOLN
4.0000 mg | Freq: Once | INTRAVENOUS | Status: AC
  Administered 2024-01-06: 4 mg via INTRAVENOUS
  Filled 2024-01-06: qty 1

## 2024-01-06 MED ORDER — ONDANSETRON HCL 4 MG/2ML IJ SOLN
4.0000 mg | Freq: Once | INTRAMUSCULAR | Status: AC
  Administered 2024-01-06: 4 mg via INTRAVENOUS
  Filled 2024-01-06: qty 2

## 2024-01-06 MED ORDER — IOHEXOL 300 MG/ML  SOLN
100.0000 mL | Freq: Once | INTRAMUSCULAR | Status: AC | PRN
  Administered 2024-01-06: 100 mL via INTRAVENOUS

## 2024-01-06 MED ORDER — SODIUM CHLORIDE 0.9 % IV SOLN
12.5000 mg | Freq: Once | INTRAVENOUS | Status: AC
  Administered 2024-01-06: 12.5 mg via INTRAVENOUS
  Filled 2024-01-06: qty 12.5

## 2024-01-06 MED ORDER — LIDOCAINE VISCOUS HCL 2 % MT SOLN
15.0000 mL | Freq: Once | OROMUCOSAL | Status: AC
  Administered 2024-01-06: 15 mL via ORAL
  Filled 2024-01-06: qty 15

## 2024-01-06 MED ORDER — SODIUM CHLORIDE 0.9 % IV BOLUS
1000.0000 mL | Freq: Once | INTRAVENOUS | Status: AC
  Administered 2024-01-06: 1000 mL via INTRAVENOUS

## 2024-01-06 NOTE — ED Notes (Signed)
 Discharge reviewed with patient and patient's son. Son enroute to bring patient home.
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
## 2024-01-06 NOTE — ED Triage Notes (Signed)
 Pt to ED from home with generalized abdominal pain since yesterday with N/V. Denies recent fevers/ urinary symptoms.

## 2024-01-06 NOTE — ED Notes (Signed)
 Per Dr Willo, foley catheter repositioned. Balloon deflated and foley advanced. Balloon then reinflated.

## 2024-01-06 NOTE — ED Notes (Signed)
 Patient given applesauce and ice cream to eat. More awake. Reports stomach doesn't hurt right now.

## 2024-01-06 NOTE — ED Provider Notes (Signed)
-----------------------------------------   3:01 PM on 01/06/2024 -----------------------------------------  Blood pressure 112/62, pulse 100, temperature (!) 97.5 F (36.4 C), temperature source Oral, resp. rate 18, height 6' 2 (1.88 m), weight 96.6 kg, SpO2 100%.  Assuming care from Dr. Suzanne.  In short, Bradley [name] is a 78 y.o. male with a chief complaint of Abdominal Pain .  Refer to the original H&P for additional details.  The current plan of care is to follow-up CT abdomen/pelvis and urinalysis.  ----------------------------------------- 10:34 PM on 01/06/2024 ----------------------------------------- CT abdomen/pelvis shows stable peristomal herniation without complicating features such as obstruction, does show bladder wall thickening as well as low-lying Foley catheter.  Balloon of catheter was deflated and inserted further to improve positioning, patient denies any symptoms symptoms concerning for UTI and we will hold off on antibiotics.  He reports primarily feeling nauseous with frequent belching on reassessment, suspect he has some gastritis.  Pain now improved following GI cocktail.  Nausea improved following IV Phenergan  but patient did become more somnolent requiring additional observation.  Once more awake, patient stated that his stomach felt much better and he was tolerating oral intake without difficulty.  He is appropriate for discharge home with outpatient follow-up, was counseled to return to the ED for new or worsening symptoms.  Patient agrees with plan.       Bradley Dunnings, MD 01/06/24 2236

## 2024-01-08 ENCOUNTER — Inpatient Hospital Stay: Attending: Oncology

## 2024-01-08 DIAGNOSIS — L89154 Pressure ulcer of sacral region, stage 4: Secondary | ICD-10-CM | POA: Diagnosis not present

## 2024-01-08 DIAGNOSIS — D75839 Thrombocytosis, unspecified: Secondary | ICD-10-CM | POA: Diagnosis not present

## 2024-01-08 DIAGNOSIS — I739 Peripheral vascular disease, unspecified: Secondary | ICD-10-CM | POA: Diagnosis not present

## 2024-01-08 DIAGNOSIS — S81802D Unspecified open wound, left lower leg, subsequent encounter: Secondary | ICD-10-CM | POA: Diagnosis not present

## 2024-01-08 DIAGNOSIS — Z933 Colostomy status: Secondary | ICD-10-CM | POA: Diagnosis not present

## 2024-01-08 DIAGNOSIS — N400 Enlarged prostate without lower urinary tract symptoms: Secondary | ICD-10-CM | POA: Insufficient documentation

## 2024-01-08 DIAGNOSIS — L89892 Pressure ulcer of other site, stage 2: Secondary | ICD-10-CM | POA: Diagnosis not present

## 2024-01-08 DIAGNOSIS — L89893 Pressure ulcer of other site, stage 3: Secondary | ICD-10-CM | POA: Diagnosis not present

## 2024-01-08 DIAGNOSIS — D509 Iron deficiency anemia, unspecified: Secondary | ICD-10-CM | POA: Insufficient documentation

## 2024-01-08 DIAGNOSIS — Z803 Family history of malignant neoplasm of breast: Secondary | ICD-10-CM | POA: Diagnosis not present

## 2024-01-08 DIAGNOSIS — G8220 Paraplegia, unspecified: Secondary | ICD-10-CM | POA: Insufficient documentation

## 2024-01-08 DIAGNOSIS — R5383 Other fatigue: Secondary | ICD-10-CM | POA: Insufficient documentation

## 2024-01-08 DIAGNOSIS — Z87891 Personal history of nicotine dependence: Secondary | ICD-10-CM | POA: Diagnosis not present

## 2024-01-08 DIAGNOSIS — M8668 Other chronic osteomyelitis, other site: Secondary | ICD-10-CM | POA: Diagnosis not present

## 2024-01-08 DIAGNOSIS — I10 Essential (primary) hypertension: Secondary | ICD-10-CM | POA: Diagnosis not present

## 2024-01-08 DIAGNOSIS — D508 Other iron deficiency anemias: Secondary | ICD-10-CM

## 2024-01-08 DIAGNOSIS — L89894 Pressure ulcer of other site, stage 4: Secondary | ICD-10-CM | POA: Diagnosis not present

## 2024-01-08 LAB — RETIC PANEL
Immature Retic Fract: 19.8 % — ABNORMAL HIGH (ref 2.3–15.9)
RBC.: 4.12 MIL/uL — ABNORMAL LOW (ref 4.22–5.81)
Retic Count, Absolute: 80.8 K/uL (ref 19.0–186.0)
Retic Ct Pct: 2 % (ref 0.4–3.1)
Reticulocyte Hemoglobin: 20.6 pg — ABNORMAL LOW (ref >27.9)

## 2024-01-08 LAB — CBC WITH DIFFERENTIAL (CANCER CENTER ONLY)
Abs Immature Granulocytes: 0.02 K/uL (ref 0.00–0.07)
Basophils Absolute: 0 K/uL (ref 0.0–0.1)
Basophils Relative: 0 %
Eosinophils Absolute: 0.2 K/uL (ref 0.0–0.5)
Eosinophils Relative: 3 %
HCT: 28.5 % — ABNORMAL LOW (ref 39.0–52.0)
Hemoglobin: 8.2 g/dL — ABNORMAL LOW (ref 13.0–17.0)
Immature Granulocytes: 0 %
Lymphocytes Relative: 28 %
Lymphs Abs: 2 K/uL (ref 0.7–4.0)
MCH: 19.8 pg — ABNORMAL LOW (ref 26.0–34.0)
MCHC: 28.8 g/dL — ABNORMAL LOW (ref 30.0–36.0)
MCV: 68.7 fL — ABNORMAL LOW (ref 80.0–100.0)
Monocytes Absolute: 0.5 K/uL (ref 0.1–1.0)
Monocytes Relative: 7 %
Neutro Abs: 4.5 K/uL (ref 1.7–7.7)
Neutrophils Relative %: 62 %
Platelet Count: 539 K/uL — ABNORMAL HIGH (ref 150–400)
RBC: 4.15 MIL/uL — ABNORMAL LOW (ref 4.22–5.81)
RDW: 19.6 % — ABNORMAL HIGH (ref 11.5–15.5)
WBC Count: 7.2 K/uL (ref 4.0–10.5)
nRBC: 0 % (ref 0.0–0.2)

## 2024-01-08 LAB — IRON AND TIBC
Iron: 17 ug/dL — ABNORMAL LOW (ref 45–182)
Saturation Ratios: 9 % — ABNORMAL LOW (ref 17.9–39.5)
TIBC: 193 ug/dL — ABNORMAL LOW (ref 250–450)
UIBC: 176 ug/dL

## 2024-01-08 LAB — SAMPLE TO BLOOD BANK

## 2024-01-08 LAB — FERRITIN: Ferritin: 264 ng/mL (ref 24–336)

## 2024-01-08 LAB — ABO/RH: ABO/RH(D): A NEG

## 2024-01-09 ENCOUNTER — Encounter: Payer: Self-pay | Admitting: Oncology

## 2024-01-09 ENCOUNTER — Inpatient Hospital Stay (HOSPITAL_BASED_OUTPATIENT_CLINIC_OR_DEPARTMENT_OTHER): Admitting: Oncology

## 2024-01-09 ENCOUNTER — Inpatient Hospital Stay

## 2024-01-09 VITALS — BP 110/60 | HR 89

## 2024-01-09 VITALS — BP 107/54 | HR 88 | Temp 96.0°F | Resp 18

## 2024-01-09 DIAGNOSIS — D509 Iron deficiency anemia, unspecified: Secondary | ICD-10-CM | POA: Diagnosis not present

## 2024-01-09 DIAGNOSIS — Z803 Family history of malignant neoplasm of breast: Secondary | ICD-10-CM | POA: Diagnosis not present

## 2024-01-09 DIAGNOSIS — D75838 Other thrombocytosis: Secondary | ICD-10-CM | POA: Insufficient documentation

## 2024-01-09 DIAGNOSIS — Z933 Colostomy status: Secondary | ICD-10-CM | POA: Diagnosis not present

## 2024-01-09 DIAGNOSIS — N400 Enlarged prostate without lower urinary tract symptoms: Secondary | ICD-10-CM | POA: Diagnosis not present

## 2024-01-09 DIAGNOSIS — R5383 Other fatigue: Secondary | ICD-10-CM | POA: Diagnosis not present

## 2024-01-09 DIAGNOSIS — R718 Other abnormality of red blood cells: Secondary | ICD-10-CM | POA: Insufficient documentation

## 2024-01-09 DIAGNOSIS — Z87891 Personal history of nicotine dependence: Secondary | ICD-10-CM | POA: Diagnosis not present

## 2024-01-09 DIAGNOSIS — D75839 Thrombocytosis, unspecified: Secondary | ICD-10-CM | POA: Diagnosis not present

## 2024-01-09 DIAGNOSIS — D508 Other iron deficiency anemias: Secondary | ICD-10-CM

## 2024-01-09 DIAGNOSIS — G8220 Paraplegia, unspecified: Secondary | ICD-10-CM | POA: Diagnosis not present

## 2024-01-09 LAB — URINE CULTURE: Culture: >=100000 — AB

## 2024-01-09 MED ORDER — IRON SUCROSE 20 MG/ML IV SOLN
200.0000 mg | Freq: Once | INTRAVENOUS | Status: AC
Start: 2024-01-09 — End: 2024-01-09
  Administered 2024-01-09: 200 mg via INTRAVENOUS
  Filled 2024-01-09: qty 10

## 2024-01-09 NOTE — Assessment & Plan Note (Addendum)
 Likely due to chronic iron  deficiency. Previous  hemoglobinopathy evaluation on 02/15/2017 was negative except slightly decreased A2.  Check alpha thalassemia PCR.[name]

## 2024-01-09 NOTE — Assessment & Plan Note (Addendum)
 Anemia due to iron  deficiency anemia as well as anemia due to chronic inflammation. Ferritin level is not accurate due to chronic inflammation.  He previously responded well to IV Venofer  treatments. Labs are reviewed and discussed with patient Lab Results  Component Value Date   HGB 8.2 (L) 01/08/2024   TIBC 193 (L) 01/08/2024   IRONPCTSAT 9 (L) 01/08/2024   FERRITIN 264 01/08/2024    Soluble transferrin receptor level is pending. Recommend 1 dose of IV Venofer  200 mg x 1.  Repeat hemoglobin in 2 weeks.  We have discussed about GI work up and patient is concerned that he may not tolerate procedure.

## 2024-01-09 NOTE — Addendum Note (Signed)
 Addended by: BABARA CALL on: 01/09/2024 08:34 PM   Modules accepted: Orders

## 2024-01-09 NOTE — Assessment & Plan Note (Signed)
 Likely reactive from iron  deficiency or chronic inflammation

## 2024-01-09 NOTE — Progress Notes (Addendum)
 Hematology/Oncology Progress note [phone] [phone]   Patient Care Team: Alla Amis, MD as PCP - General (Family Medicine) Babara Call, MD as Consulting Physician (Oncology)   ASSESSMENT & PLAN:   Iron  deficiency anemia Anemia due to iron  deficiency anemia as well as anemia due to chronic inflammation. Ferritin level is not accurate due to chronic inflammation.  He previously responded well to IV Venofer  treatments. Labs are reviewed and discussed with patient Lab Results  Component Value Date   HGB 8.2 (L) 01/08/2024   TIBC 193 (L) 01/08/2024   IRONPCTSAT 9 (L) 01/08/2024   FERRITIN 264 01/08/2024    Soluble transferrin receptor level is pending. Recommend 1 dose of IV Venofer  200 mg x 1.  Repeat hemoglobin in 2 weeks.  We have discussed about GI work up and patient is concerned that he may not tolerate procedure.    Microcytosis Likely due to chronic iron  deficiency. Previous  hemoglobinopathy evaluation on 02/15/2017 was negative except slightly decreased A2.  Check alpha thalassemia PCR.[name]  Thrombocytosis Likely reactive from iron  deficiency or chronic inflammation  Orders Placed This Encounter  Procedures   CBC with Differential (Cancer Center Only)    Standing Status:   Future    Expected Date:   01/23/2024    Expiration Date:   04/22/2024   Hgb Fractionation Cascade    Standing Status:   Future    Expected Date:   01/23/2024    Expiration Date:   04/22/2024   Retic Panel    Standing Status:   Future    Expected Date:   01/23/2024    Expiration Date:   04/22/2024   Sample to Blood Bank    Standing Status:   Future    Expected Date:   01/23/2024    Expiration Date:   04/22/2024   Follow up TBD 2 weeks repeat labs.  All questions were answered. The patient knows to call the clinic with any problems, questions or concerns.  Call Babara, MD, PhD [facility] Hematology Oncology 01/09/2024   CHIEF COMPLAINTS/PURPOSE OF CONSULTATION:   Anemia  HISTORY OF PRESENTING ILLNESS:  Bradley [name] 78 y.o.  male with PMH listed as below who was referred to me for evaluation of iron  deficiency anemia.  Patient is paraplegic, also has chronic pressure ulcer, recently just had colostomy. He self catheterizes. Patient feels that his energy levels have been the same. He always has mild fatigue.  He has BPH for which he take finasteride . He has not see his urologist for a while.  He was seen by [facility] for elevated ESR and his chronic hand joint tenderness. He was not considered to have rheumatoid arthritis.     INTERVAL HISTORY Patient presents to follow-up for anemia management.   Denies hematochezia, hematuria, hematemesis, epistaxis, black tarry stool or easy bruising.   Chronic sacral osteomyelitis, history of sepsis. follows up with wound care and infectious disease specialist.  Currently off antibiotics.   ROS:  Review of Systems  Constitutional:  Negative for fatigue.  HENT:  Negative.  Negative for lump/mass and mouth sores.   Eyes: Negative.  Negative for eye problems.  Respiratory: Negative.  Negative for chest tightness and cough.   Cardiovascular: Negative.  Negative for chest pain.  Gastrointestinal: Negative.  Negative for abdominal distention and abdominal pain.       Colostomy  Endocrine: Negative.  Negative for hot flashes.  Genitourinary:  Negative for hematuria.        Self catherize  Musculoskeletal:  Positive for arthralgias. Negative for back pain.       Paraplegic  Skin:  Positive for wound. Negative for itching and rash.       Chronic pressure ulcer  Neurological: Negative.  Negative for dizziness.  Hematological: Negative.  Negative for adenopathy.  Psychiatric/Behavioral: Negative.  The patient is not nervous/anxious.     MEDICAL HISTORY:  Past Medical History:  Diagnosis Date   Anemia    Arthritis    Paralysis of both lower limbs (HCC)    Sacral decubitus ulcer     SURGICAL  HISTORY: Past Surgical History:  Procedure Laterality Date   COLONOSCOPY  06/06/2014   INCISION AND DRAINAGE OF WOUND N/A 06/04/2018   Procedure: IRRIGATION AND DEBRIDEMENT WOUND;  Surgeon: Desiderio Schanz, MD;  Location: [facility] ORS;  Service: General;  Laterality: N/A;   TONSILLECTOMY     WOUND DEBRIDEMENT Left 01/28/2023   Procedure: LEFT LEG DEBRIDEMENT OF ULCER NEAR GLUTEUS;  Surgeon: Tye Millet, DO;  Location: [facility] ORS;  Service: General;  Laterality: Left;    SOCIAL HISTORY: Social History   Socioeconomic History   Marital status: Widowed    Spouse name: Not on file   Number of children: Not on file   Years of education: Not on file   Highest education level: Not on file  Occupational History   Not on file  Tobacco Use   Smoking status: Former    Current packs/day: 0.00    Average packs/day: 0.5 packs/day for 5.0 years (2.5 ttl pk-yrs)    Types: Cigarettes    Start date: 12    Quit date: 63    Years since quitting: 53.6   Smokeless tobacco: Never  Vaping Use   Vaping status: Never Used  Substance and Sexual Activity   Alcohol use: No   Drug use: No   Sexual activity: Not Currently  Other Topics Concern   Not on file  Social History Narrative   Not on file   Social Drivers of Health   Financial Resource Strain: Low Risk  (08/11/2023)   Received from [facility]   Overall Financial Resource Strain (CARDIA)    Difficulty of Paying Living Expenses: Not very hard  Food Insecurity: No Food Insecurity (08/11/2023)   Received from [facility]   Hunger Vital Sign    Within the past 12 months, you worried that your food would run out before you got the money to buy more.: Never true    Within the past 12 months, the food you bought just didn't last and you didn't have money to get more.: Never true  Transportation Needs: No Transportation Needs (08/11/2023)   Received from [facility]   PRAPARE - Transportation    Lack of Transportation (Medical): No     Lack of Transportation (Non-Medical): No  Physical Activity: Not on file  Stress: Not on file  Social Connections: Not on file  Intimate Partner Violence: Not At Risk (05/04/2023)   Humiliation, Afraid, Rape, and Kick questionnaire    Fear of Current or Ex-Partner: No    Emotionally Abused: No    Physically Abused: No    Sexually Abused: No    FAMILY HISTORY: Family History  Problem Relation Age of Onset   Breast cancer Mother     ALLERGIES:  is allergic to lisinopril.  MEDICATIONS:  Current Outpatient Medications  Medication Sig Dispense Refill   acetaminophen  (TYLENOL ) 500 MG tablet Take 1,000 mg by mouth 3 (three) times  daily.      AMBULATORY NON FORMULARY MEDICATION Patient needs size 36 condom catheters.  He uses two condom catheters along with collection bags daily. 60 Units 12   ferrous sulfate  325 (65 FE) MG EC tablet Take 1 tablet (325 mg total) by mouth in the morning and at bedtime. 60 tablet 2   Multiple Vitamin (MULTI-VITAMINS) TABS Take 1 tablet by mouth daily.      ondansetron  (ZOFRAN -ODT) 4 MG disintegrating tablet Take 1 tablet (4 mg total) by mouth every 8 (eight) hours as needed for nausea or vomiting. 12 tablet 0   polyethylene glycol powder (GLYCOLAX /MIRALAX ) powder Take 17 g by mouth daily as needed for mild constipation.      vitamin C  (ASCORBIC ACID ) 500 MG tablet Take 500 mg by mouth 2 (two) times daily.     No current facility-administered medications for this visit.      [name]  PHYSICAL EXAMINATION: ECOG PERFORMANCE STATUS: 2 - Symptomatic, <50% confined to bed Vitals:   01/09/24 1114  BP: (!) 107/54  Pulse: 88  Resp: 18  Temp: (!) 96 F (35.6 C)  SpO2: 100%   There were no vitals filed for this visit.  Physical Exam Constitutional:      General: He is not in acute distress.    Appearance: He is not diaphoretic.  HENT:     Head: Normocephalic.  Eyes:     General: No scleral icterus. Cardiovascular:     Rate and Rhythm: Normal rate.   Pulmonary:     Effort: Pulmonary effort is normal. No respiratory distress.  Abdominal:     General: There is no distension.  Musculoskeletal:     Cervical back: Normal range of motion and neck supple.     Comments: Bilateral ulnar deviation of metacarpophalangeal joints and joint tenderness.    Skin:    General: Skin is warm and dry.  Neurological:     Mental Status: He is alert and oriented to person, place, and time. Mental status is at baseline.     Motor: No abnormal muscle tone.     Comments:  paraplegic  Psychiatric:        Mood and Affect: Affect normal.       LABORATORY DATA:  I have reviewed the data as listed     Latest Ref Rng & Units 01/08/2024   10:41 AM 01/06/2024    1:54 PM 11/10/2023   12:46 PM  CBC  WBC 4.0 - 10.5 K/uL 7.2  7.8  11.7   Hemoglobin 13.0 - 17.0 g/dL 8.2  8.6  9.0   Hematocrit 39.0 - 52.0 % 28.5  29.9  30.6   Platelets 150 - 400 K/uL 539  579  603       Latest Ref Rng & Units 01/06/2024    1:54 PM 11/10/2023   12:46 PM 05/07/2023    8:50 AM  CMP  Glucose 70 - 99 mg/dL 95  99  83   BUN 8 - 23 mg/dL 11  16  8    Creatinine 0.61 - 1.24 mg/dL 9.56  9.37  9.47   Sodium 135 - 145 mmol/L 140  136  135   Potassium 3.5 - 5.1 mmol/L 3.7  2.6  3.2   Chloride 98 - 111 mmol/L 100  97  103   CO2 22 - 32 mmol/L 28  29  23    Calcium 8.9 - 10.3 mg/dL 8.7  8.5  8.3   Total Protein 6.5 - 8.1  g/dL 8.7  8.6    Total Bilirubin 0.0 - 1.2 mg/dL 0.3  0.6    Alkaline Phos 38 - 126 U/L 71  63    AST 15 - 41 U/L 17  17    ALT 0 - 44 U/L <5  8      Iron /TIBC/Ferritin/ %Sat    Component Value Date/Time   IRON  17 (L) 01/08/2024 1041   TIBC 193 (L) 01/08/2024 1041   FERRITIN 264 01/08/2024 1041   IRONPCTSAT 9 (L) 01/08/2024 1041

## 2024-01-10 ENCOUNTER — Encounter: Attending: Physician Assistant | Admitting: Physician Assistant

## 2024-01-10 DIAGNOSIS — I7389 Other specified peripheral vascular diseases: Secondary | ICD-10-CM | POA: Diagnosis not present

## 2024-01-10 DIAGNOSIS — L89894 Pressure ulcer of other site, stage 4: Secondary | ICD-10-CM | POA: Insufficient documentation

## 2024-01-10 DIAGNOSIS — I739 Peripheral vascular disease, unspecified: Secondary | ICD-10-CM | POA: Diagnosis not present

## 2024-01-10 DIAGNOSIS — L89324 Pressure ulcer of left buttock, stage 4: Secondary | ICD-10-CM | POA: Insufficient documentation

## 2024-01-10 DIAGNOSIS — L89513 Pressure ulcer of right ankle, stage 3: Secondary | ICD-10-CM | POA: Diagnosis not present

## 2024-01-10 DIAGNOSIS — L89154 Pressure ulcer of sacral region, stage 4: Secondary | ICD-10-CM | POA: Diagnosis not present

## 2024-01-10 DIAGNOSIS — M8668 Other chronic osteomyelitis, other site: Secondary | ICD-10-CM | POA: Diagnosis not present

## 2024-01-10 DIAGNOSIS — G8220 Paraplegia, unspecified: Secondary | ICD-10-CM | POA: Insufficient documentation

## 2024-01-10 NOTE — Progress Notes (Signed)
 ED Antimicrobial Stewardship Positive Culture Follow Up   Bradley [name] is an 78 y.o. male who presented to [facility] on 01/06/2024 with a chief complaint of  Chief Complaint  Patient presents with   Abdominal Pain    Recent Results (from the past 720 hours)  Urine Culture     Status: Abnormal   Collection Time: 01/06/24  1:54 PM   Specimen: Urine, Catheterized  Result Value Ref Range Status   Specimen Description   Final    URINE, CATHETERIZED Performed at [facility], [address], [place], [place] [address]    Special Requests   Final    NONE Performed at [facility], [address] [place], [place] [address]    Culture (A)  Final    >=100,000 COLONIES/mL PROTEUS MIRABILIS 10,000 COLONIES/mL STAPHYLOCOCCUS AUREUS METHICILLIN RESISTANT STAPHYLOCOCCUS AUREUS    Report Status 01/09/2024 FINAL  Final   Organism ID, Bacteria PROTEUS MIRABILIS (A)  Final   Organism ID, Bacteria STAPHYLOCOCCUS AUREUS (A)  Final      Susceptibility   Proteus mirabilis - MIC*    AMPICILLIN  <=2 SENSITIVE Sensitive     CEFAZOLIN  8 SENSITIVE Sensitive     CEFEPIME  <=0.12 SENSITIVE Sensitive     CEFTRIAXONE  <=0.25 SENSITIVE Sensitive     CIPROFLOXACIN >=4 RESISTANT Resistant     GENTAMICIN <=1 SENSITIVE Sensitive     IMIPENEM 2 SENSITIVE Sensitive     NITROFURANTOIN  128 RESISTANT Resistant     TRIMETH/SULFA <=20 SENSITIVE Sensitive     AMPICILLIN /SULBACTAM <=2 SENSITIVE Sensitive     PIP/TAZO <=4 SENSITIVE Sensitive ug/mL    * >=100,000 COLONIES/mL PROTEUS MIRABILIS   Staphylococcus aureus - MIC*    CIPROFLOXACIN >=8 RESISTANT Resistant     GENTAMICIN <=0.5 SENSITIVE Sensitive     NITROFURANTOIN  <=16 SENSITIVE Sensitive     OXACILLIN >=4 RESISTANT Resistant     TETRACYCLINE  >=16 RESISTANT Resistant     VANCOMYCIN  1 SENSITIVE Sensitive     TRIMETH/SULFA <=10 SENSITIVE Sensitive     RIFAMPIN <=0.5 SENSITIVE Sensitive     Inducible Clindamycin NEGATIVE Sensitive      LINEZOLID 2 SENSITIVE Sensitive     * 10,000 COLONIES/mL STAPHYLOCOCCUS AUREUS    []  Treated with N/A, organism resistant to prescribed antimicrobial [x]  Patient discharged originally without antimicrobial agent and treatment is now indicated  New antibiotic prescription: None prescribed (plan was for Bactrim 1 DS BID x 7d if symptoms)  ED Provider: Dr. Viviann  Commentary:   Discussed urine culture results with EDP and suspect reflects colonization of foley catheter and not infection. Provider agreed and plan was to call patient to see if he had improved and remained asymptomatic in which case would hold off on antibiotic treatment. However, if patient reported symptoms, plan was to treat with Bactrim.   Spoke with patient via telephone and discussed results. Patient endorsed feeling better since ED visit and denied feeling ill or fevers. Given his history of UTI, patient stated he typically knows when he has one and stated he felt he did not have a UTI at this time. Patient okay with plan to hold off on antibiotic treatment. Return precautions discussed.  Bradley [name] 01/10/2024, 11:31 AM

## 2024-01-12 DIAGNOSIS — S81802D Unspecified open wound, left lower leg, subsequent encounter: Secondary | ICD-10-CM | POA: Diagnosis not present

## 2024-01-12 DIAGNOSIS — L89893 Pressure ulcer of other site, stage 3: Secondary | ICD-10-CM | POA: Diagnosis not present

## 2024-01-12 DIAGNOSIS — L89892 Pressure ulcer of other site, stage 2: Secondary | ICD-10-CM | POA: Diagnosis not present

## 2024-01-12 DIAGNOSIS — M8668 Other chronic osteomyelitis, other site: Secondary | ICD-10-CM | POA: Diagnosis not present

## 2024-01-12 DIAGNOSIS — I739 Peripheral vascular disease, unspecified: Secondary | ICD-10-CM | POA: Diagnosis not present

## 2024-01-12 DIAGNOSIS — I10 Essential (primary) hypertension: Secondary | ICD-10-CM | POA: Diagnosis not present

## 2024-01-12 DIAGNOSIS — L89894 Pressure ulcer of other site, stage 4: Secondary | ICD-10-CM | POA: Diagnosis not present

## 2024-01-12 DIAGNOSIS — G8220 Paraplegia, unspecified: Secondary | ICD-10-CM | POA: Diagnosis not present

## 2024-01-12 DIAGNOSIS — L89154 Pressure ulcer of sacral region, stage 4: Secondary | ICD-10-CM | POA: Diagnosis not present

## 2024-01-15 DIAGNOSIS — I10 Essential (primary) hypertension: Secondary | ICD-10-CM | POA: Diagnosis not present

## 2024-01-15 DIAGNOSIS — I739 Peripheral vascular disease, unspecified: Secondary | ICD-10-CM | POA: Diagnosis not present

## 2024-01-15 DIAGNOSIS — L89894 Pressure ulcer of other site, stage 4: Secondary | ICD-10-CM | POA: Diagnosis not present

## 2024-01-15 DIAGNOSIS — G8220 Paraplegia, unspecified: Secondary | ICD-10-CM | POA: Diagnosis not present

## 2024-01-15 DIAGNOSIS — L89154 Pressure ulcer of sacral region, stage 4: Secondary | ICD-10-CM | POA: Diagnosis not present

## 2024-01-15 DIAGNOSIS — M8668 Other chronic osteomyelitis, other site: Secondary | ICD-10-CM | POA: Diagnosis not present

## 2024-01-15 DIAGNOSIS — L89892 Pressure ulcer of other site, stage 2: Secondary | ICD-10-CM | POA: Diagnosis not present

## 2024-01-15 DIAGNOSIS — L89893 Pressure ulcer of other site, stage 3: Secondary | ICD-10-CM | POA: Diagnosis not present

## 2024-01-15 DIAGNOSIS — S81802D Unspecified open wound, left lower leg, subsequent encounter: Secondary | ICD-10-CM | POA: Diagnosis not present

## 2024-01-16 DIAGNOSIS — N390 Urinary tract infection, site not specified: Secondary | ICD-10-CM | POA: Diagnosis not present

## 2024-01-19 DIAGNOSIS — I739 Peripheral vascular disease, unspecified: Secondary | ICD-10-CM | POA: Diagnosis not present

## 2024-01-19 DIAGNOSIS — G8220 Paraplegia, unspecified: Secondary | ICD-10-CM | POA: Diagnosis not present

## 2024-01-19 DIAGNOSIS — L89892 Pressure ulcer of other site, stage 2: Secondary | ICD-10-CM | POA: Diagnosis not present

## 2024-01-19 DIAGNOSIS — L89893 Pressure ulcer of other site, stage 3: Secondary | ICD-10-CM | POA: Diagnosis not present

## 2024-01-19 DIAGNOSIS — I10 Essential (primary) hypertension: Secondary | ICD-10-CM | POA: Diagnosis not present

## 2024-01-19 DIAGNOSIS — M8668 Other chronic osteomyelitis, other site: Secondary | ICD-10-CM | POA: Diagnosis not present

## 2024-01-19 DIAGNOSIS — L89154 Pressure ulcer of sacral region, stage 4: Secondary | ICD-10-CM | POA: Diagnosis not present

## 2024-01-19 DIAGNOSIS — L89894 Pressure ulcer of other site, stage 4: Secondary | ICD-10-CM | POA: Diagnosis not present

## 2024-01-19 DIAGNOSIS — S81802D Unspecified open wound, left lower leg, subsequent encounter: Secondary | ICD-10-CM | POA: Diagnosis not present

## 2024-01-22 DIAGNOSIS — G8220 Paraplegia, unspecified: Secondary | ICD-10-CM | POA: Diagnosis not present

## 2024-01-22 DIAGNOSIS — L89893 Pressure ulcer of other site, stage 3: Secondary | ICD-10-CM | POA: Diagnosis not present

## 2024-01-22 DIAGNOSIS — I739 Peripheral vascular disease, unspecified: Secondary | ICD-10-CM | POA: Diagnosis not present

## 2024-01-22 DIAGNOSIS — L89894 Pressure ulcer of other site, stage 4: Secondary | ICD-10-CM | POA: Diagnosis not present

## 2024-01-22 DIAGNOSIS — I10 Essential (primary) hypertension: Secondary | ICD-10-CM | POA: Diagnosis not present

## 2024-01-22 DIAGNOSIS — M8668 Other chronic osteomyelitis, other site: Secondary | ICD-10-CM | POA: Diagnosis not present

## 2024-01-22 DIAGNOSIS — S81802D Unspecified open wound, left lower leg, subsequent encounter: Secondary | ICD-10-CM | POA: Diagnosis not present

## 2024-01-22 DIAGNOSIS — L89892 Pressure ulcer of other site, stage 2: Secondary | ICD-10-CM | POA: Diagnosis not present

## 2024-01-22 DIAGNOSIS — L89154 Pressure ulcer of sacral region, stage 4: Secondary | ICD-10-CM | POA: Diagnosis not present

## 2024-01-23 ENCOUNTER — Inpatient Hospital Stay

## 2024-01-23 DIAGNOSIS — N400 Enlarged prostate without lower urinary tract symptoms: Secondary | ICD-10-CM | POA: Diagnosis not present

## 2024-01-23 DIAGNOSIS — Z803 Family history of malignant neoplasm of breast: Secondary | ICD-10-CM | POA: Diagnosis not present

## 2024-01-23 DIAGNOSIS — R5383 Other fatigue: Secondary | ICD-10-CM | POA: Diagnosis not present

## 2024-01-23 DIAGNOSIS — D509 Iron deficiency anemia, unspecified: Secondary | ICD-10-CM

## 2024-01-23 DIAGNOSIS — Z87891 Personal history of nicotine dependence: Secondary | ICD-10-CM | POA: Diagnosis not present

## 2024-01-23 DIAGNOSIS — G8220 Paraplegia, unspecified: Secondary | ICD-10-CM | POA: Diagnosis not present

## 2024-01-23 DIAGNOSIS — D75839 Thrombocytosis, unspecified: Secondary | ICD-10-CM | POA: Diagnosis not present

## 2024-01-23 DIAGNOSIS — R718 Other abnormality of red blood cells: Secondary | ICD-10-CM

## 2024-01-23 DIAGNOSIS — Z933 Colostomy status: Secondary | ICD-10-CM | POA: Diagnosis not present

## 2024-01-23 LAB — CBC WITH DIFFERENTIAL (CANCER CENTER ONLY)
Abs Immature Granulocytes: 0.06 K/uL (ref 0.00–0.07)
Basophils Absolute: 0 K/uL (ref 0.0–0.1)
Basophils Relative: 0 %
Eosinophils Absolute: 0.2 K/uL (ref 0.0–0.5)
Eosinophils Relative: 2 %
HCT: 25.1 % — ABNORMAL LOW (ref 39.0–52.0)
Hemoglobin: 7.2 g/dL — ABNORMAL LOW (ref 13.0–17.0)
Immature Granulocytes: 1 %
Lymphocytes Relative: 23 %
Lymphs Abs: 2 K/uL (ref 0.7–4.0)
MCH: 19.7 pg — ABNORMAL LOW (ref 26.0–34.0)
MCHC: 28.7 g/dL — ABNORMAL LOW (ref 30.0–36.0)
MCV: 68.8 fL — ABNORMAL LOW (ref 80.0–100.0)
Monocytes Absolute: 0.7 K/uL (ref 0.1–1.0)
Monocytes Relative: 8 %
Neutro Abs: 5.8 K/uL (ref 1.7–7.7)
Neutrophils Relative %: 66 %
Platelet Count: 518 K/uL — ABNORMAL HIGH (ref 150–400)
RBC: 3.65 MIL/uL — ABNORMAL LOW (ref 4.22–5.81)
RDW: 18.9 % — ABNORMAL HIGH (ref 11.5–15.5)
Smear Review: NORMAL
WBC Count: 8.8 K/uL (ref 4.0–10.5)
nRBC: 0 % (ref 0.0–0.2)

## 2024-01-23 LAB — RETIC PANEL
Immature Retic Fract: 22 % — ABNORMAL HIGH (ref 2.3–15.9)
RBC.: 3.68 MIL/uL — ABNORMAL LOW (ref 4.22–5.81)
Retic Count, Absolute: 46.7 K/uL (ref 19.0–186.0)
Retic Ct Pct: 1.3 % (ref 0.4–3.1)
Reticulocyte Hemoglobin: 19.8 pg — ABNORMAL LOW (ref >27.9)

## 2024-01-23 LAB — SAMPLE TO BLOOD BANK

## 2024-01-24 DIAGNOSIS — L89892 Pressure ulcer of other site, stage 2: Secondary | ICD-10-CM | POA: Diagnosis not present

## 2024-01-24 DIAGNOSIS — G8220 Paraplegia, unspecified: Secondary | ICD-10-CM | POA: Diagnosis not present

## 2024-01-24 DIAGNOSIS — L89893 Pressure ulcer of other site, stage 3: Secondary | ICD-10-CM | POA: Diagnosis not present

## 2024-01-24 DIAGNOSIS — I739 Peripheral vascular disease, unspecified: Secondary | ICD-10-CM | POA: Diagnosis not present

## 2024-01-24 DIAGNOSIS — I10 Essential (primary) hypertension: Secondary | ICD-10-CM | POA: Diagnosis not present

## 2024-01-24 DIAGNOSIS — M8668 Other chronic osteomyelitis, other site: Secondary | ICD-10-CM | POA: Diagnosis not present

## 2024-01-24 DIAGNOSIS — L89154 Pressure ulcer of sacral region, stage 4: Secondary | ICD-10-CM | POA: Diagnosis not present

## 2024-01-24 DIAGNOSIS — L89894 Pressure ulcer of other site, stage 4: Secondary | ICD-10-CM | POA: Diagnosis not present

## 2024-01-24 DIAGNOSIS — S81802D Unspecified open wound, left lower leg, subsequent encounter: Secondary | ICD-10-CM | POA: Diagnosis not present

## 2024-01-25 ENCOUNTER — Encounter: Admitting: Physician Assistant

## 2024-01-25 DIAGNOSIS — M8668 Other chronic osteomyelitis, other site: Secondary | ICD-10-CM | POA: Diagnosis not present

## 2024-01-25 DIAGNOSIS — L89894 Pressure ulcer of other site, stage 4: Secondary | ICD-10-CM | POA: Diagnosis not present

## 2024-01-25 DIAGNOSIS — G8220 Paraplegia, unspecified: Secondary | ICD-10-CM | POA: Diagnosis not present

## 2024-01-25 DIAGNOSIS — I7389 Other specified peripheral vascular diseases: Secondary | ICD-10-CM | POA: Diagnosis not present

## 2024-01-25 DIAGNOSIS — L89513 Pressure ulcer of right ankle, stage 3: Secondary | ICD-10-CM | POA: Diagnosis not present

## 2024-01-25 DIAGNOSIS — L89324 Pressure ulcer of left buttock, stage 4: Secondary | ICD-10-CM | POA: Diagnosis not present

## 2024-01-25 DIAGNOSIS — L89154 Pressure ulcer of sacral region, stage 4: Secondary | ICD-10-CM | POA: Diagnosis not present

## 2024-01-25 DIAGNOSIS — I739 Peripheral vascular disease, unspecified: Secondary | ICD-10-CM | POA: Diagnosis not present

## 2024-01-26 DIAGNOSIS — S81802A Unspecified open wound, left lower leg, initial encounter: Secondary | ICD-10-CM | POA: Diagnosis not present

## 2024-01-26 DIAGNOSIS — S31000A Unspecified open wound of lower back and pelvis without penetration into retroperitoneum, initial encounter: Secondary | ICD-10-CM | POA: Diagnosis not present

## 2024-01-26 DIAGNOSIS — I10 Essential (primary) hypertension: Secondary | ICD-10-CM | POA: Diagnosis not present

## 2024-01-26 DIAGNOSIS — L89892 Pressure ulcer of other site, stage 2: Secondary | ICD-10-CM | POA: Diagnosis not present

## 2024-01-26 DIAGNOSIS — L89894 Pressure ulcer of other site, stage 4: Secondary | ICD-10-CM | POA: Diagnosis not present

## 2024-01-26 DIAGNOSIS — K631 Perforation of intestine (nontraumatic): Secondary | ICD-10-CM | POA: Diagnosis not present

## 2024-01-26 DIAGNOSIS — E46 Unspecified protein-calorie malnutrition: Secondary | ICD-10-CM | POA: Diagnosis not present

## 2024-01-26 DIAGNOSIS — L89513 Pressure ulcer of right ankle, stage 3: Secondary | ICD-10-CM | POA: Diagnosis not present

## 2024-01-26 DIAGNOSIS — L89324 Pressure ulcer of left buttock, stage 4: Secondary | ICD-10-CM | POA: Diagnosis not present

## 2024-01-26 DIAGNOSIS — I739 Peripheral vascular disease, unspecified: Secondary | ICD-10-CM | POA: Diagnosis not present

## 2024-01-26 DIAGNOSIS — G8220 Paraplegia, unspecified: Secondary | ICD-10-CM | POA: Diagnosis not present

## 2024-01-26 DIAGNOSIS — D509 Iron deficiency anemia, unspecified: Secondary | ICD-10-CM | POA: Diagnosis not present

## 2024-01-26 DIAGNOSIS — L24A9 Irritant contact dermatitis due friction or contact with other specified body fluids: Secondary | ICD-10-CM | POA: Diagnosis not present

## 2024-01-26 DIAGNOSIS — L89159 Pressure ulcer of sacral region, unspecified stage: Secondary | ICD-10-CM | POA: Diagnosis not present

## 2024-01-26 DIAGNOSIS — L89154 Pressure ulcer of sacral region, stage 4: Secondary | ICD-10-CM | POA: Diagnosis not present

## 2024-01-26 DIAGNOSIS — L89893 Pressure ulcer of other site, stage 3: Secondary | ICD-10-CM | POA: Diagnosis not present

## 2024-01-26 DIAGNOSIS — S81809A Unspecified open wound, unspecified lower leg, initial encounter: Secondary | ICD-10-CM | POA: Diagnosis not present

## 2024-01-29 DIAGNOSIS — I739 Peripheral vascular disease, unspecified: Secondary | ICD-10-CM | POA: Diagnosis not present

## 2024-01-29 DIAGNOSIS — L89894 Pressure ulcer of other site, stage 4: Secondary | ICD-10-CM | POA: Diagnosis not present

## 2024-01-29 DIAGNOSIS — D509 Iron deficiency anemia, unspecified: Secondary | ICD-10-CM | POA: Diagnosis not present

## 2024-01-29 DIAGNOSIS — E46 Unspecified protein-calorie malnutrition: Secondary | ICD-10-CM | POA: Diagnosis not present

## 2024-01-29 DIAGNOSIS — G8220 Paraplegia, unspecified: Secondary | ICD-10-CM | POA: Diagnosis not present

## 2024-01-29 DIAGNOSIS — L89154 Pressure ulcer of sacral region, stage 4: Secondary | ICD-10-CM | POA: Diagnosis not present

## 2024-01-29 DIAGNOSIS — L89893 Pressure ulcer of other site, stage 3: Secondary | ICD-10-CM | POA: Diagnosis not present

## 2024-01-29 DIAGNOSIS — L89892 Pressure ulcer of other site, stage 2: Secondary | ICD-10-CM | POA: Diagnosis not present

## 2024-01-29 DIAGNOSIS — I10 Essential (primary) hypertension: Secondary | ICD-10-CM | POA: Diagnosis not present

## 2024-01-31 ENCOUNTER — Ambulatory Visit: Payer: Self-pay | Admitting: Oncology

## 2024-01-31 DIAGNOSIS — D508 Other iron deficiency anemias: Secondary | ICD-10-CM

## 2024-02-01 DIAGNOSIS — I10 Essential (primary) hypertension: Secondary | ICD-10-CM | POA: Diagnosis not present

## 2024-02-01 DIAGNOSIS — L89892 Pressure ulcer of other site, stage 2: Secondary | ICD-10-CM | POA: Diagnosis not present

## 2024-02-01 DIAGNOSIS — E46 Unspecified protein-calorie malnutrition: Secondary | ICD-10-CM | POA: Diagnosis not present

## 2024-02-01 DIAGNOSIS — L89894 Pressure ulcer of other site, stage 4: Secondary | ICD-10-CM | POA: Diagnosis not present

## 2024-02-01 DIAGNOSIS — I739 Peripheral vascular disease, unspecified: Secondary | ICD-10-CM | POA: Diagnosis not present

## 2024-02-01 DIAGNOSIS — G8220 Paraplegia, unspecified: Secondary | ICD-10-CM | POA: Diagnosis not present

## 2024-02-01 DIAGNOSIS — L89893 Pressure ulcer of other site, stage 3: Secondary | ICD-10-CM | POA: Diagnosis not present

## 2024-02-01 DIAGNOSIS — L89154 Pressure ulcer of sacral region, stage 4: Secondary | ICD-10-CM | POA: Diagnosis not present

## 2024-02-01 LAB — ALPHA-THALASSEMIA ANALYSIS: IMAGE: 0

## 2024-02-01 NOTE — Telephone Encounter (Signed)
-----   Message from Zelphia Cap sent at 01/31/2024 11:32 PM EDT ----- Please arrange patient to repeat blood work CBC, hold tube +/- 1 unit of PRBC transfusion.  Also arrange additional IV Venofer  200 mg x 2.  Follow-up with me in 4 weeks, lab prior to MD +/- Venofer .  CBC hold tube iron  TIBC ferritin ----- Message ----- From: Interface, Lab In [place] Sent: 01/23/2024  11:12 AM EDT To: Zelphia Cap, MD

## 2024-02-01 NOTE — Progress Notes (Signed)
 Pt called back and he will keep appt on 9/3

## 2024-02-01 NOTE — Telephone Encounter (Signed)
 Spoke to pt and he states that he is paraplegic and has transportation issues. He needs to let transportation know ahead of time.   He has been scheduled for lab/ Venofer  on 9/3 but he will call back to let us  know if that does  not work with his transportation. He cannot do poss blood transfusion on 9/4 due to other appts and 9/5 (infusion is at max capaticy). Told pt that if he were to need blood, we will notify him at appt on 9/3.  He has also been scheduled for Venofer  on 9/10. Sooner appts offered but patient declined due to transportation.   He will also need  follow-up: 4 weeks: labs prior to MD/ Venoer (cbc, hold tube, iron , ferritin)

## 2024-02-02 DIAGNOSIS — I739 Peripheral vascular disease, unspecified: Secondary | ICD-10-CM | POA: Diagnosis not present

## 2024-02-02 DIAGNOSIS — L89893 Pressure ulcer of other site, stage 3: Secondary | ICD-10-CM | POA: Diagnosis not present

## 2024-02-02 DIAGNOSIS — L89892 Pressure ulcer of other site, stage 2: Secondary | ICD-10-CM | POA: Diagnosis not present

## 2024-02-02 DIAGNOSIS — G8220 Paraplegia, unspecified: Secondary | ICD-10-CM | POA: Diagnosis not present

## 2024-02-02 DIAGNOSIS — L89154 Pressure ulcer of sacral region, stage 4: Secondary | ICD-10-CM | POA: Diagnosis not present

## 2024-02-02 DIAGNOSIS — I10 Essential (primary) hypertension: Secondary | ICD-10-CM | POA: Diagnosis not present

## 2024-02-02 DIAGNOSIS — D509 Iron deficiency anemia, unspecified: Secondary | ICD-10-CM | POA: Diagnosis not present

## 2024-02-02 DIAGNOSIS — L89894 Pressure ulcer of other site, stage 4: Secondary | ICD-10-CM | POA: Diagnosis not present

## 2024-02-02 DIAGNOSIS — E46 Unspecified protein-calorie malnutrition: Secondary | ICD-10-CM | POA: Diagnosis not present

## 2024-02-07 ENCOUNTER — Inpatient Hospital Stay

## 2024-02-07 ENCOUNTER — Inpatient Hospital Stay: Attending: Oncology

## 2024-02-07 ENCOUNTER — Inpatient Hospital Stay: Admitting: Nurse Practitioner

## 2024-02-07 VITALS — BP 114/54 | HR 101 | Temp 97.4°F | Resp 17

## 2024-02-07 DIAGNOSIS — R55 Syncope and collapse: Secondary | ICD-10-CM | POA: Diagnosis not present

## 2024-02-07 DIAGNOSIS — D508 Other iron deficiency anemias: Secondary | ICD-10-CM

## 2024-02-07 DIAGNOSIS — Z87891 Personal history of nicotine dependence: Secondary | ICD-10-CM | POA: Diagnosis not present

## 2024-02-07 DIAGNOSIS — D509 Iron deficiency anemia, unspecified: Secondary | ICD-10-CM | POA: Diagnosis not present

## 2024-02-07 LAB — SAMPLE TO BLOOD BANK

## 2024-02-07 LAB — CBC WITH DIFFERENTIAL (CANCER CENTER ONLY)
Abs Immature Granulocytes: 0.04 K/uL (ref 0.00–0.07)
Basophils Absolute: 0 K/uL (ref 0.0–0.1)
Basophils Relative: 0 %
Eosinophils Absolute: 0.2 K/uL (ref 0.0–0.5)
Eosinophils Relative: 2 %
HCT: 24.5 % — ABNORMAL LOW (ref 39.0–52.0)
Hemoglobin: 7.3 g/dL — ABNORMAL LOW (ref 13.0–17.0)
Immature Granulocytes: 0 %
Lymphocytes Relative: 19 %
Lymphs Abs: 1.7 K/uL (ref 0.7–4.0)
MCH: 20 pg — ABNORMAL LOW (ref 26.0–34.0)
MCHC: 29.8 g/dL — ABNORMAL LOW (ref 30.0–36.0)
MCV: 67.1 fL — ABNORMAL LOW (ref 80.0–100.0)
Monocytes Absolute: 0.8 K/uL (ref 0.1–1.0)
Monocytes Relative: 9 %
Neutro Abs: 6.5 K/uL (ref 1.7–7.7)
Neutrophils Relative %: 70 %
Platelet Count: 558 K/uL — ABNORMAL HIGH (ref 150–400)
RBC: 3.65 MIL/uL — ABNORMAL LOW (ref 4.22–5.81)
RDW: 18.7 % — ABNORMAL HIGH (ref 11.5–15.5)
WBC Count: 9.3 K/uL (ref 4.0–10.5)
nRBC: 0 % (ref 0.0–0.2)

## 2024-02-07 MED ORDER — IRON SUCROSE 20 MG/ML IV SOLN
200.0000 mg | Freq: Once | INTRAVENOUS | Status: AC
  Administered 2024-02-07: 200 mg via INTRAVENOUS

## 2024-02-07 NOTE — Progress Notes (Signed)
 After completion of Venofer  and pt leaving pt became unresponsive and vasovagaled. Pt states that he has not been eating or drinking. Pts vitals remained stable throughout. Pt given juice, water and crackers and tolerated well. Pt observed for 30 additional minutes and maintained WNL. Lauren, NP assessed and deemed pt safe for discharge. Pt escorted by this Clinical research associate to outside for [employer].

## 2024-02-07 NOTE — Progress Notes (Signed)
 Symptom Management Clinic  [facility] Department of the [employer]. [facility] [address] [place], [place] [address] [phone] (phone) [phone] (fax)  Patient Care Team: Alla Amis, MD as PCP - General (Family Medicine) Babara Call, MD as Consulting Physician (Oncology)   Name of the patient: Bradley [name]  [id number]  [birth year]   Date of visit: 02/07/24  Diagnosis- Iron  Deficiency Anemia  Chief complaint/ Reason for visit- Infusion Reaction/Syncope  Heme/Onc History:  History of iron  deficiency anemia receiving iv venofer   Interval history- Bradley [name] is a 78 y.o. male currently receiving IV venofer  who according to nursing became unresponsive and passed out after receiving IV infusion. He had declined to stay for 30 minute post treatment period, was mobilizing via electric wheelchair, when he lost consciousness. He drove into another chair before stopping. Per nursing he appeared slumped in chair and did not respond to verbal cues. Pulse was slowed but present. I was called for evaluation of possible 'seizure'.   Review of systems- Review of Systems  Unable to perform ROS: Acuity of condition    Allergies  Allergen Reactions   Lisinopril Cough   Past Medical History:  Diagnosis Date   Anemia    Arthritis    Paralysis of both lower limbs (HCC)    Sacral decubitus ulcer    Past Surgical History:  Procedure Laterality Date   COLONOSCOPY  06/06/2014   INCISION AND DRAINAGE OF WOUND N/A 06/04/2018   Procedure: IRRIGATION AND DEBRIDEMENT WOUND;  Surgeon: Desiderio Schanz, MD;  Location: [facility] ORS;  Service: General;  Laterality: N/A;   TONSILLECTOMY     WOUND DEBRIDEMENT Left 01/28/2023   Procedure: LEFT LEG DEBRIDEMENT OF ULCER NEAR GLUTEUS;  Surgeon: Tye Millet, DO;  Location: [facility] ORS;  Service: General;  Laterality: Left;   Social History   Socioeconomic History   Marital status: Widowed     Spouse name: Not on file   Number of children: Not on file   Years of education: Not on file   Highest education level: Not on file  Occupational History   Not on file  Tobacco Use   Smoking status: Former    Current packs/day: 0.00    Average packs/day: 0.5 packs/day for 5.0 years (2.5 ttl pk-yrs)    Types: Cigarettes    Start date: 10    Quit date: 108    Years since quitting: 53.7   Smokeless tobacco: Never  Vaping Use   Vaping status: Never Used  Substance and Sexual Activity   Alcohol use: No   Drug use: No   Sexual activity: Not Currently  Other Topics Concern   Not on file  Social History Narrative   Not on file   Social Drivers of Health   Financial Resource Strain: Low Risk  (08/11/2023)   Received from [facility]   Overall Financial Resource Strain (CARDIA)    Difficulty of Paying Living Expenses: Not very hard  Food Insecurity: No Food Insecurity (08/11/2023)   Received from [facility]   Hunger Vital Sign    Within the past 12 months, you worried that your food would run out before you got the money to buy more.: Never true    Within the past 12 months, the food you bought just didn't last and you didn't have money to get more.: Never true  Transportation Needs: No Transportation Needs (08/11/2023)   Received from [facility]  PRAPARE - Administrator, Civil Service (Medical): No    Lack of Transportation (Non-Medical): No  Physical Activity: Not on file  Stress: Not on file  Social Connections: Not on file  Intimate Partner Violence: Not At Risk (05/04/2023)   Humiliation, Afraid, Rape, and Kick questionnaire    Fear of Current or Ex-Partner: No    Emotionally Abused: No    Physically Abused: No    Sexually Abused: No   Family History  Problem Relation Age of Onset   Breast cancer Mother     Current Outpatient Medications:    acetaminophen  (TYLENOL ) 500 MG tablet, Take 1,000 mg by mouth 3 (three) times daily. , Disp: , Rfl:     AMBULATORY NON FORMULARY MEDICATION, Patient needs size 36 condom catheters.  He uses two condom catheters along with collection bags daily., Disp: 60 Units, Rfl: 12   Multiple Vitamin (MULTI-VITAMINS) TABS, Take 1 tablet by mouth daily. , Disp: , Rfl:    ondansetron  (ZOFRAN -ODT) 4 MG disintegrating tablet, Take 1 tablet (4 mg total) by mouth every 8 (eight) hours as needed for nausea or vomiting., Disp: 12 tablet, Rfl: 0   polyethylene glycol powder (GLYCOLAX /MIRALAX ) powder, Take 17 g by mouth daily as needed for mild constipation. , Disp: , Rfl:    vitamin C  (ASCORBIC ACID ) 500 MG tablet, Take 500 mg by mouth 2 (two) times daily., Disp: , Rfl:   Physical Exam: There were no vitals filed for this visit. Physical Exam Constitutional:      Comments: Lethargic male in wheelchair seen in infusion  Skin:    Coloration: Skin is not pale.  Neurological:     Mental Status: He is lethargic.     GCS: GCS eye subscore is 4. GCS verbal subscore is 5.     Cranial Nerves: No cranial nerve deficit, dysarthria or facial asymmetry.     Comments: Fatigued appearing, oriented. Responding appropriately.      Assessment and plan- Patient is a 78 y.o. male who presents to symptom management clinic for   Vasovagal episode- occurred following venofer  infusion. Patient had not been eating or drinking today. Vitals were normal. His alertness improved. We kept him 30 minutes until he was back to baseline. Ok for discharge. Notified Dr Babara. Don't recommend any changes and no apparent allergic reaction.     Visit Diagnosis 1. Syncope, vasovagal    Patient expressed understanding and was in agreement with this plan. He also understands that He can call clinic at any time with any questions, concerns, or complaints.   Thank you for allowing me to participate in the care of this pleasant patient.   Tinnie Dawn, DNP, AGNP-C, AOCNP [facility] at [facility] [phone]

## 2024-02-08 ENCOUNTER — Ambulatory Visit: Admitting: Physician Assistant

## 2024-02-09 DIAGNOSIS — R278 Other lack of coordination: Secondary | ICD-10-CM | POA: Diagnosis not present

## 2024-02-09 DIAGNOSIS — E440 Moderate protein-calorie malnutrition: Secondary | ICD-10-CM | POA: Diagnosis not present

## 2024-02-09 DIAGNOSIS — R279 Unspecified lack of coordination: Secondary | ICD-10-CM | POA: Diagnosis not present

## 2024-02-09 DIAGNOSIS — A419 Sepsis, unspecified organism: Secondary | ICD-10-CM | POA: Diagnosis not present

## 2024-02-09 DIAGNOSIS — E611 Iron deficiency: Secondary | ICD-10-CM | POA: Diagnosis not present

## 2024-02-09 DIAGNOSIS — M24561 Contracture, right knee: Secondary | ICD-10-CM | POA: Diagnosis not present

## 2024-02-09 DIAGNOSIS — L89154 Pressure ulcer of sacral region, stage 4: Secondary | ICD-10-CM | POA: Diagnosis not present

## 2024-02-09 DIAGNOSIS — M4628 Osteomyelitis of vertebra, sacral and sacrococcygeal region: Secondary | ICD-10-CM | POA: Diagnosis not present

## 2024-02-09 DIAGNOSIS — R509 Fever, unspecified: Secondary | ICD-10-CM | POA: Diagnosis not present

## 2024-02-09 DIAGNOSIS — Z79899 Other long term (current) drug therapy: Secondary | ICD-10-CM | POA: Diagnosis not present

## 2024-02-09 DIAGNOSIS — I739 Peripheral vascular disease, unspecified: Secondary | ICD-10-CM | POA: Diagnosis not present

## 2024-02-09 DIAGNOSIS — L89159 Pressure ulcer of sacral region, unspecified stage: Secondary | ICD-10-CM | POA: Diagnosis not present

## 2024-02-09 DIAGNOSIS — R590 Localized enlarged lymph nodes: Secondary | ICD-10-CM | POA: Diagnosis not present

## 2024-02-09 DIAGNOSIS — N179 Acute kidney failure, unspecified: Secondary | ICD-10-CM | POA: Diagnosis not present

## 2024-02-09 DIAGNOSIS — R1311 Dysphagia, oral phase: Secondary | ICD-10-CM | POA: Diagnosis not present

## 2024-02-09 DIAGNOSIS — L89304 Pressure ulcer of unspecified buttock, stage 4: Secondary | ICD-10-CM | POA: Diagnosis not present

## 2024-02-09 DIAGNOSIS — R269 Unspecified abnormalities of gait and mobility: Secondary | ICD-10-CM | POA: Diagnosis not present

## 2024-02-09 DIAGNOSIS — L89892 Pressure ulcer of other site, stage 2: Secondary | ICD-10-CM | POA: Diagnosis not present

## 2024-02-09 DIAGNOSIS — R609 Edema, unspecified: Secondary | ICD-10-CM | POA: Diagnosis not present

## 2024-02-09 DIAGNOSIS — R41841 Cognitive communication deficit: Secondary | ICD-10-CM | POA: Diagnosis not present

## 2024-02-09 DIAGNOSIS — K592 Neurogenic bowel, not elsewhere classified: Secondary | ICD-10-CM | POA: Diagnosis not present

## 2024-02-09 DIAGNOSIS — T8131XA Disruption of external operation (surgical) wound, not elsewhere classified, initial encounter: Secondary | ICD-10-CM | POA: Diagnosis not present

## 2024-02-09 DIAGNOSIS — M869 Osteomyelitis, unspecified: Secondary | ICD-10-CM | POA: Diagnosis not present

## 2024-02-09 DIAGNOSIS — G8220 Paraplegia, unspecified: Secondary | ICD-10-CM | POA: Diagnosis not present

## 2024-02-09 DIAGNOSIS — L89324 Pressure ulcer of left buttock, stage 4: Secondary | ICD-10-CM | POA: Diagnosis not present

## 2024-02-09 DIAGNOSIS — A499 Bacterial infection, unspecified: Secondary | ICD-10-CM | POA: Diagnosis not present

## 2024-02-09 DIAGNOSIS — L89894 Pressure ulcer of other site, stage 4: Secondary | ICD-10-CM | POA: Diagnosis not present

## 2024-02-09 DIAGNOSIS — I10 Essential (primary) hypertension: Secondary | ICD-10-CM | POA: Diagnosis not present

## 2024-02-09 DIAGNOSIS — L089 Local infection of the skin and subcutaneous tissue, unspecified: Secondary | ICD-10-CM | POA: Diagnosis not present

## 2024-02-09 DIAGNOSIS — M8618 Other acute osteomyelitis, other site: Secondary | ICD-10-CM | POA: Diagnosis not present

## 2024-02-09 DIAGNOSIS — Z933 Colostomy status: Secondary | ICD-10-CM | POA: Diagnosis not present

## 2024-02-09 DIAGNOSIS — J9811 Atelectasis: Secondary | ICD-10-CM | POA: Diagnosis not present

## 2024-02-09 DIAGNOSIS — E46 Unspecified protein-calorie malnutrition: Secondary | ICD-10-CM | POA: Diagnosis not present

## 2024-02-09 DIAGNOSIS — D509 Iron deficiency anemia, unspecified: Secondary | ICD-10-CM | POA: Diagnosis not present

## 2024-02-09 DIAGNOSIS — L89309 Pressure ulcer of unspecified buttock, unspecified stage: Secondary | ICD-10-CM | POA: Diagnosis not present

## 2024-02-09 DIAGNOSIS — R5381 Other malaise: Secondary | ICD-10-CM | POA: Diagnosis not present

## 2024-02-09 DIAGNOSIS — L89893 Pressure ulcer of other site, stage 3: Secondary | ICD-10-CM | POA: Diagnosis not present

## 2024-02-09 DIAGNOSIS — M6281 Muscle weakness (generalized): Secondary | ICD-10-CM | POA: Diagnosis not present

## 2024-02-09 DIAGNOSIS — R000 Tachycardia, unspecified: Secondary | ICD-10-CM | POA: Diagnosis not present

## 2024-02-10 DIAGNOSIS — A419 Sepsis, unspecified organism: Secondary | ICD-10-CM | POA: Diagnosis not present

## 2024-02-14 ENCOUNTER — Inpatient Hospital Stay

## 2024-02-21 DIAGNOSIS — K458 Other specified abdominal hernia without obstruction or gangrene: Secondary | ICD-10-CM | POA: Diagnosis not present

## 2024-02-21 DIAGNOSIS — E46 Unspecified protein-calorie malnutrition: Secondary | ICD-10-CM | POA: Diagnosis not present

## 2024-02-21 DIAGNOSIS — Z960 Presence of urogenital implants: Secondary | ICD-10-CM | POA: Diagnosis not present

## 2024-02-21 DIAGNOSIS — Z933 Colostomy status: Secondary | ICD-10-CM | POA: Diagnosis not present

## 2024-02-21 DIAGNOSIS — R7989 Other specified abnormal findings of blood chemistry: Secondary | ICD-10-CM | POA: Diagnosis present

## 2024-02-21 DIAGNOSIS — R269 Unspecified abnormalities of gait and mobility: Secondary | ICD-10-CM | POA: Diagnosis not present

## 2024-02-21 DIAGNOSIS — Z743 Need for continuous supervision: Secondary | ICD-10-CM | POA: Diagnosis not present

## 2024-02-21 DIAGNOSIS — R9431 Abnormal electrocardiogram [ECG] [EKG]: Secondary | ICD-10-CM | POA: Diagnosis not present

## 2024-02-21 DIAGNOSIS — Z7189 Other specified counseling: Secondary | ICD-10-CM | POA: Diagnosis not present

## 2024-02-21 DIAGNOSIS — M4628 Osteomyelitis of vertebra, sacral and sacrococcygeal region: Secondary | ICD-10-CM | POA: Diagnosis not present

## 2024-02-21 DIAGNOSIS — R1311 Dysphagia, oral phase: Secondary | ICD-10-CM | POA: Diagnosis not present

## 2024-02-21 DIAGNOSIS — R41841 Cognitive communication deficit: Secondary | ICD-10-CM | POA: Diagnosis not present

## 2024-02-21 DIAGNOSIS — L89154 Pressure ulcer of sacral region, stage 4: Secondary | ICD-10-CM | POA: Diagnosis not present

## 2024-02-21 DIAGNOSIS — R278 Other lack of coordination: Secondary | ICD-10-CM | POA: Diagnosis not present

## 2024-02-21 DIAGNOSIS — M6281 Muscle weakness (generalized): Secondary | ICD-10-CM | POA: Diagnosis not present

## 2024-02-21 DIAGNOSIS — L89133 Pressure ulcer of right lower back, stage 3: Secondary | ICD-10-CM | POA: Diagnosis not present

## 2024-02-21 DIAGNOSIS — A499 Bacterial infection, unspecified: Secondary | ICD-10-CM | POA: Diagnosis not present

## 2024-02-21 DIAGNOSIS — R279 Unspecified lack of coordination: Secondary | ICD-10-CM | POA: Diagnosis not present

## 2024-02-21 DIAGNOSIS — L89313 Pressure ulcer of right buttock, stage 3: Secondary | ICD-10-CM | POA: Diagnosis not present

## 2024-02-21 DIAGNOSIS — D649 Anemia, unspecified: Secondary | ICD-10-CM | POA: Diagnosis not present

## 2024-02-21 DIAGNOSIS — L89144 Pressure ulcer of left lower back, stage 4: Secondary | ICD-10-CM | POA: Diagnosis not present

## 2024-02-21 DIAGNOSIS — I959 Hypotension, unspecified: Secondary | ICD-10-CM | POA: Diagnosis not present

## 2024-02-21 DIAGNOSIS — L89324 Pressure ulcer of left buttock, stage 4: Secondary | ICD-10-CM | POA: Diagnosis not present

## 2024-02-21 DIAGNOSIS — M24561 Contracture, right knee: Secondary | ICD-10-CM | POA: Diagnosis not present

## 2024-02-21 DIAGNOSIS — R7889 Finding of other specified substances, not normally found in blood: Secondary | ICD-10-CM | POA: Diagnosis not present

## 2024-02-21 DIAGNOSIS — I10 Essential (primary) hypertension: Secondary | ICD-10-CM | POA: Diagnosis not present

## 2024-02-21 DIAGNOSIS — G8220 Paraplegia, unspecified: Secondary | ICD-10-CM | POA: Diagnosis not present

## 2024-02-21 DIAGNOSIS — E611 Iron deficiency: Secondary | ICD-10-CM | POA: Diagnosis not present

## 2024-02-21 DIAGNOSIS — L89514 Pressure ulcer of right ankle, stage 4: Secondary | ICD-10-CM | POA: Diagnosis not present

## 2024-02-21 DIAGNOSIS — D509 Iron deficiency anemia, unspecified: Secondary | ICD-10-CM | POA: Diagnosis not present

## 2024-02-21 DIAGNOSIS — Z89612 Acquired absence of left leg above knee: Secondary | ICD-10-CM | POA: Diagnosis not present

## 2024-02-21 DIAGNOSIS — R5381 Other malaise: Secondary | ICD-10-CM | POA: Diagnosis not present

## 2024-02-22 DIAGNOSIS — D509 Iron deficiency anemia, unspecified: Secondary | ICD-10-CM | POA: Diagnosis not present

## 2024-02-22 DIAGNOSIS — L89133 Pressure ulcer of right lower back, stage 3: Secondary | ICD-10-CM | POA: Diagnosis not present

## 2024-02-22 DIAGNOSIS — G8220 Paraplegia, unspecified: Secondary | ICD-10-CM | POA: Diagnosis not present

## 2024-02-22 DIAGNOSIS — I10 Essential (primary) hypertension: Secondary | ICD-10-CM | POA: Diagnosis not present

## 2024-02-22 DIAGNOSIS — M4628 Osteomyelitis of vertebra, sacral and sacrococcygeal region: Secondary | ICD-10-CM | POA: Diagnosis not present

## 2024-02-22 DIAGNOSIS — L89514 Pressure ulcer of right ankle, stage 4: Secondary | ICD-10-CM | POA: Diagnosis not present

## 2024-02-22 DIAGNOSIS — Z933 Colostomy status: Secondary | ICD-10-CM | POA: Diagnosis not present

## 2024-02-22 DIAGNOSIS — L89154 Pressure ulcer of sacral region, stage 4: Secondary | ICD-10-CM | POA: Diagnosis not present

## 2024-02-22 DIAGNOSIS — Z89612 Acquired absence of left leg above knee: Secondary | ICD-10-CM | POA: Diagnosis not present

## 2024-02-22 DIAGNOSIS — E46 Unspecified protein-calorie malnutrition: Secondary | ICD-10-CM | POA: Diagnosis not present

## 2024-02-22 DIAGNOSIS — L89324 Pressure ulcer of left buttock, stage 4: Secondary | ICD-10-CM | POA: Diagnosis not present

## 2024-02-22 DIAGNOSIS — M6281 Muscle weakness (generalized): Secondary | ICD-10-CM | POA: Diagnosis not present

## 2024-02-22 DIAGNOSIS — D649 Anemia, unspecified: Secondary | ICD-10-CM | POA: Diagnosis not present

## 2024-02-22 DIAGNOSIS — L89144 Pressure ulcer of left lower back, stage 4: Secondary | ICD-10-CM | POA: Diagnosis not present

## 2024-02-27 ENCOUNTER — Inpatient Hospital Stay

## 2024-02-29 DIAGNOSIS — L89324 Pressure ulcer of left buttock, stage 4: Secondary | ICD-10-CM | POA: Diagnosis not present

## 2024-02-29 DIAGNOSIS — L89133 Pressure ulcer of right lower back, stage 3: Secondary | ICD-10-CM | POA: Diagnosis not present

## 2024-02-29 DIAGNOSIS — M4628 Osteomyelitis of vertebra, sacral and sacrococcygeal region: Secondary | ICD-10-CM | POA: Diagnosis not present

## 2024-02-29 DIAGNOSIS — Z933 Colostomy status: Secondary | ICD-10-CM | POA: Diagnosis not present

## 2024-02-29 DIAGNOSIS — L89154 Pressure ulcer of sacral region, stage 4: Secondary | ICD-10-CM | POA: Diagnosis not present

## 2024-02-29 DIAGNOSIS — G8220 Paraplegia, unspecified: Secondary | ICD-10-CM | POA: Diagnosis not present

## 2024-02-29 DIAGNOSIS — E46 Unspecified protein-calorie malnutrition: Secondary | ICD-10-CM | POA: Diagnosis not present

## 2024-02-29 DIAGNOSIS — Z7189 Other specified counseling: Secondary | ICD-10-CM | POA: Diagnosis not present

## 2024-02-29 DIAGNOSIS — L89313 Pressure ulcer of right buttock, stage 3: Secondary | ICD-10-CM | POA: Diagnosis not present

## 2024-02-29 DIAGNOSIS — N318 Other neuromuscular dysfunction of bladder: Secondary | ICD-10-CM | POA: Diagnosis not present

## 2024-02-29 DIAGNOSIS — L89144 Pressure ulcer of left lower back, stage 4: Secondary | ICD-10-CM | POA: Diagnosis not present

## 2024-02-29 DIAGNOSIS — D649 Anemia, unspecified: Secondary | ICD-10-CM | POA: Diagnosis not present

## 2024-02-29 DIAGNOSIS — Z89612 Acquired absence of left leg above knee: Secondary | ICD-10-CM | POA: Diagnosis not present

## 2024-02-29 DIAGNOSIS — L89514 Pressure ulcer of right ankle, stage 4: Secondary | ICD-10-CM | POA: Diagnosis not present

## 2024-03-05 ENCOUNTER — Ambulatory Visit: Admitting: Oncology

## 2024-03-05 ENCOUNTER — Ambulatory Visit

## 2024-03-07 ENCOUNTER — Other Ambulatory Visit: Payer: Self-pay

## 2024-03-07 ENCOUNTER — Emergency Department
Admission: EM | Admit: 2024-03-07 | Discharge: 2024-03-07 | Disposition: A | Discharge status: Skilled Nursing Facility | Attending: Emergency Medicine | Admitting: Emergency Medicine

## 2024-03-07 DIAGNOSIS — R9431 Abnormal electrocardiogram [ECG] [EKG]: Secondary | ICD-10-CM | POA: Diagnosis not present

## 2024-03-07 DIAGNOSIS — R7989 Other specified abnormal findings of blood chemistry: Secondary | ICD-10-CM | POA: Diagnosis present

## 2024-03-07 DIAGNOSIS — D509 Iron deficiency anemia, unspecified: Secondary | ICD-10-CM | POA: Diagnosis not present

## 2024-03-07 LAB — COMPREHENSIVE METABOLIC PANEL WITH GFR
ALT: 9 U/L (ref 0–44)
AST: 20 U/L (ref 15–41)
Albumin: 2.1 g/dL — ABNORMAL LOW (ref 3.5–5.0)
Alkaline Phosphatase: 57 U/L (ref 38–126)
Anion gap: 9 (ref 5–15)
BUN: 10 mg/dL (ref 8–23)
CO2: 27 mmol/L (ref 22–32)
Calcium: 8.6 mg/dL — ABNORMAL LOW (ref 8.9–10.3)
Chloride: 102 mmol/L (ref 98–111)
Creatinine, Ser: 0.56 mg/dL — ABNORMAL LOW (ref 0.61–1.24)
GFR, Estimated: >60 mL/min (ref >60)
Glucose, Bld: 96 mg/dL (ref 70–99)
Potassium: 3.2 mmol/L — ABNORMAL LOW (ref 3.5–5.1)
Sodium: 138 mmol/L (ref 135–145)
Total Bilirubin: 0.4 mg/dL (ref 0.0–1.2)
Total Protein: 7.6 g/dL (ref 6.5–8.1)

## 2024-03-07 LAB — CBC
HCT: 25 % — ABNORMAL LOW (ref 39.0–52.0)
Hemoglobin: 7.3 g/dL — ABNORMAL LOW (ref 13.0–17.0)
MCH: 20.3 pg — ABNORMAL LOW (ref 26.0–34.0)
MCHC: 29.2 g/dL — ABNORMAL LOW (ref 30.0–36.0)
MCV: 69.4 fL — ABNORMAL LOW (ref 80.0–100.0)
Platelets: 470 K/uL — ABNORMAL HIGH (ref 150–400)
RBC: 3.6 MIL/uL — ABNORMAL LOW (ref 4.22–5.81)
RDW: 20.4 % — ABNORMAL HIGH (ref 11.5–15.5)
WBC: 6.8 K/uL (ref 4.0–10.5)
nRBC: 0 % (ref 0.0–0.2)

## 2024-03-07 LAB — IRON AND TIBC
Iron: 12 ug/dL — ABNORMAL LOW (ref 45–182)
Saturation Ratios: 6 % — ABNORMAL LOW (ref 17.9–39.5)
TIBC: 189 ug/dL — ABNORMAL LOW (ref 250–450)
UIBC: 177 ug/dL

## 2024-03-07 LAB — PROTIME-INR
INR: 1.2 (ref 0.8–1.2)
Prothrombin Time: 15.8 s — ABNORMAL HIGH (ref 11.4–15.2)

## 2024-03-07 LAB — PREPARE RBC (CROSSMATCH)

## 2024-03-07 LAB — FERRITIN: Ferritin: 315 ng/mL (ref 24–336)

## 2024-03-07 MED ORDER — SODIUM CHLORIDE 0.9% IV SOLUTION
Freq: Once | INTRAVENOUS | Status: DC
  Filled 2024-03-07: qty 250

## 2024-03-07 NOTE — ED Provider Notes (Signed)
 [facility] Provider Note    Event Date/Time   First MD Initiated Contact with Patient 03/07/24 1006     (approximate)  History   Chief Complaint: Abnormal Lab  HPI  Bradley [name] is a 78 y.o. male with a past medical history of lower extremity paralysis, anemia, arthritis, coming from his nursing facility for a low hemoglobin.  According to the patient he has a history of anemia required a blood transfusion recently per patient but is not sure when.  Patient denies any black or bloody stool.  Denies any bloody vomitus.  Physical Exam   Triage Vital Signs: ED Triage Vitals  Encounter Vitals Group     BP 03/07/24 0953 (!) 115/52     Girls Systolic BP Percentile --      Girls Diastolic BP Percentile --      Boys Systolic BP Percentile --      Boys Diastolic BP Percentile --      Pulse Rate 03/07/24 0953 91     Resp 03/07/24 0953 16     Temp --      Temp src --      SpO2 03/07/24 0953 100 %     Weight 03/07/24 0954 212 lb 15.4 oz (96.6 kg)     Height 03/07/24 0954 6' 2 (1.88 m)     Head Circumference --      Peak Flow --      Pain Score 03/07/24 0954 0     Pain Loc --      Pain Education --      Exclude from Growth Chart --     Most recent vital signs: Vitals:   03/07/24 0953  BP: (!) 115/52  Pulse: 91  Resp: 16  SpO2: 100%    General: Awake, no distress.  CV:  Good peripheral perfusion.  Regular rate and rhythm  Resp:  Normal effort.  Equal breath sounds bilaterally.  Abd:  No distention.  Soft, nontender.  No rebound or guarding.  ED Results / Procedures / Treatments   EKG  EKG viewed and interpreted by myself shows a sinus rhythm 89 bpm with a narrow QRS, right axis deviation, largely normal intervals, no concerning ST changes.  MEDICATIONS ORDERED IN ED: Medications - No data to display   IMPRESSION / MDM / ASSESSMENT AND PLAN / ED COURSE  I reviewed the triage vital signs and the nursing notes.  Patient's presentation is  most consistent with acute presentation with potential threat to life or bodily function.  Patient presents to the emergency department from his nursing facility for reportedly low hemoglobin around 7.0.  I reviewed the patient's lab work he has a slowly declining hemoglobin over the last few months from 9-8 and now most recently 7.27-month ago.  MCV is very low at 67 suspicious for possible iron  deficiency anemia.  We will check labs including a CBC and type and screen.  We will send an iron  panel.  We will continue to closely monitor.  Given no significant or ongoing bleeding patient could potentially get transfused and discharged home depending on his current H&H.  Patient otherwise appears well with no complaints.  Patient's lab work today shows a reassuring chemistry.  Patient's hemoglobin has decreased to 7.3.  Given the patient's chronic medical conditions I believe it be reasonable to transfuse 1 unit of packed red blood cells.  Patient states he was supposed to have an iron  infusion but had to cancel that  infusion appointment but states he will call them back for a new iron  appointment.  Given the patient's otherwise reassuring workup I believe the patient will be safe for discharge home following his transfusion.  Patient agreeable to plan as well.  I have verbally consented the patient for transfusion.  CRITICAL CARE Performed by: Franky Moores   Total critical care time: 30 minutes  Critical care time was exclusive of separately billable procedures and treating other patients.  Critical care was necessary to treat or prevent imminent or life-threatening deterioration.  Critical care was time spent personally by me on the following activities: development of treatment plan with patient and/or surrogate as well as nursing, discussions with consultants, evaluation of patient's response to treatment, examination of patient, obtaining history from patient or surrogate, ordering and  performing treatments and interventions, ordering and review of laboratory studies, ordering and review of radiographic studies, pulse oximetry and re-evaluation of patient's condition.   FINAL CLINICAL IMPRESSION(S) / ED DIAGNOSES   Anemia    Note:  This document was prepared using Dragon voice recognition software and may include unintentional dictation errors.   Moores Franky, MD 03/07/24 1336
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
## 2024-03-07 NOTE — Discharge Instructions (Signed)
 Please follow-up with your hematologist regarding your iron  infusions.  Return to the emergency department for any shortness of breath trouble breathing or any other symptom concerning to yourself.

## 2024-03-07 NOTE — ED Notes (Signed)
 Called [facility] spoke to Alm  will pick up 3-4 ahead of him

## 2024-03-08 DIAGNOSIS — D509 Iron deficiency anemia, unspecified: Secondary | ICD-10-CM | POA: Diagnosis not present

## 2024-03-08 DIAGNOSIS — K458 Other specified abdominal hernia without obstruction or gangrene: Secondary | ICD-10-CM | POA: Diagnosis not present

## 2024-03-08 DIAGNOSIS — G8220 Paraplegia, unspecified: Secondary | ICD-10-CM | POA: Diagnosis not present

## 2024-03-08 DIAGNOSIS — Z7189 Other specified counseling: Secondary | ICD-10-CM | POA: Diagnosis not present

## 2024-03-08 DIAGNOSIS — Z933 Colostomy status: Secondary | ICD-10-CM | POA: Diagnosis not present

## 2024-03-08 DIAGNOSIS — M4628 Osteomyelitis of vertebra, sacral and sacrococcygeal region: Secondary | ICD-10-CM | POA: Diagnosis not present

## 2024-03-08 DIAGNOSIS — L89324 Pressure ulcer of left buttock, stage 4: Secondary | ICD-10-CM | POA: Diagnosis not present

## 2024-03-08 DIAGNOSIS — Z960 Presence of urogenital implants: Secondary | ICD-10-CM | POA: Diagnosis not present

## 2024-03-08 LAB — BPAM RBC
Blood Product Expiration Date: 202510192359
ISSUE DATE / TIME: 202510021259
Unit Type and Rh: 600

## 2024-03-08 LAB — TYPE AND SCREEN
ABO/RH(D): A NEG
Antibody Screen: NEGATIVE
Unit division: 0

## 2024-03-14 ENCOUNTER — Telehealth: Payer: Self-pay | Admitting: *Deleted

## 2024-03-14 DIAGNOSIS — D509 Iron deficiency anemia, unspecified: Secondary | ICD-10-CM | POA: Diagnosis not present

## 2024-03-14 DIAGNOSIS — E876 Hypokalemia: Secondary | ICD-10-CM | POA: Diagnosis not present

## 2024-03-14 DIAGNOSIS — L89159 Pressure ulcer of sacral region, unspecified stage: Secondary | ICD-10-CM | POA: Diagnosis not present

## 2024-03-14 NOTE — Telephone Encounter (Signed)
 Bradley [name] is that he was in the ER on 10/ 2 blood transfusion and with his iron  issue they wanted to see if he can get earlier to see Dr. Babara IV iron .  They are doing the labs today to see what cbc .  Likely Dr. Sheran the mom would like to get a appointment for this guy for 1 to 2 weeks rather than what he has done as now

## 2024-03-14 NOTE — Telephone Encounter (Signed)
 Ok to move up appt to be in 1-2 weeks:  Labs D1 then MD/ poss blood or venofer  on D2. Please contact pt to set up appt as he has transportation issues.

## 2024-03-18 ENCOUNTER — Encounter: Payer: Self-pay | Admitting: Family Medicine

## 2024-03-26 ENCOUNTER — Inpatient Hospital Stay
Admission: EM | Admit: 2024-03-26 | Discharge: 2024-04-10 | DRG: 853 | Disposition: A | Discharge status: Other Acute Inpatient Hospital

## 2024-03-26 ENCOUNTER — Inpatient Hospital Stay

## 2024-03-26 ENCOUNTER — Other Ambulatory Visit: Payer: Self-pay

## 2024-03-26 ENCOUNTER — Emergency Department

## 2024-03-26 ENCOUNTER — Encounter: Payer: Self-pay | Admitting: Emergency Medicine

## 2024-03-26 DIAGNOSIS — N390 Urinary tract infection, site not specified: Secondary | ICD-10-CM | POA: Diagnosis present

## 2024-03-26 DIAGNOSIS — Z8614 Personal history of Methicillin resistant Staphylococcus aureus infection: Secondary | ICD-10-CM

## 2024-03-26 DIAGNOSIS — D638 Anemia in other chronic diseases classified elsewhere: Secondary | ICD-10-CM | POA: Diagnosis not present

## 2024-03-26 DIAGNOSIS — D75838 Other thrombocytosis: Secondary | ICD-10-CM | POA: Diagnosis not present

## 2024-03-26 DIAGNOSIS — L89319 Pressure ulcer of right buttock, unspecified stage: Secondary | ICD-10-CM | POA: Diagnosis present

## 2024-03-26 DIAGNOSIS — L89893 Pressure ulcer of other site, stage 3: Secondary | ICD-10-CM | POA: Diagnosis not present

## 2024-03-26 DIAGNOSIS — I700 Atherosclerosis of aorta: Secondary | ICD-10-CM | POA: Diagnosis present

## 2024-03-26 DIAGNOSIS — R590 Localized enlarged lymph nodes: Secondary | ICD-10-CM | POA: Diagnosis not present

## 2024-03-26 DIAGNOSIS — Z1152 Encounter for screening for COVID-19: Secondary | ICD-10-CM | POA: Diagnosis not present

## 2024-03-26 DIAGNOSIS — G8220 Paraplegia, unspecified: Secondary | ICD-10-CM | POA: Diagnosis present

## 2024-03-26 DIAGNOSIS — I708 Atherosclerosis of other arteries: Secondary | ICD-10-CM | POA: Diagnosis present

## 2024-03-26 DIAGNOSIS — Z1621 Resistance to vancomycin: Secondary | ICD-10-CM | POA: Diagnosis not present

## 2024-03-26 DIAGNOSIS — L89159 Pressure ulcer of sacral region, unspecified stage: Secondary | ICD-10-CM

## 2024-03-26 DIAGNOSIS — A419 Sepsis, unspecified organism: Secondary | ICD-10-CM | POA: Diagnosis not present

## 2024-03-26 DIAGNOSIS — D509 Iron deficiency anemia, unspecified: Secondary | ICD-10-CM | POA: Diagnosis not present

## 2024-03-26 DIAGNOSIS — R059 Cough, unspecified: Secondary | ICD-10-CM | POA: Diagnosis not present

## 2024-03-26 DIAGNOSIS — Z803 Family history of malignant neoplasm of breast: Secondary | ICD-10-CM

## 2024-03-26 DIAGNOSIS — J9811 Atelectasis: Secondary | ICD-10-CM | POA: Diagnosis not present

## 2024-03-26 DIAGNOSIS — I739 Peripheral vascular disease, unspecified: Secondary | ICD-10-CM | POA: Diagnosis not present

## 2024-03-26 DIAGNOSIS — Z87891 Personal history of nicotine dependence: Secondary | ICD-10-CM

## 2024-03-26 DIAGNOSIS — L89894 Pressure ulcer of other site, stage 4: Secondary | ICD-10-CM | POA: Diagnosis not present

## 2024-03-26 DIAGNOSIS — S31000A Unspecified open wound of lower back and pelvis without penetration into retroperitoneum, initial encounter: Secondary | ICD-10-CM | POA: Diagnosis present

## 2024-03-26 DIAGNOSIS — L89310 Pressure ulcer of right buttock, unstageable: Secondary | ICD-10-CM | POA: Diagnosis present

## 2024-03-26 DIAGNOSIS — I96 Gangrene, not elsewhere classified: Secondary | ICD-10-CM | POA: Diagnosis not present

## 2024-03-26 DIAGNOSIS — E46 Unspecified protein-calorie malnutrition: Secondary | ICD-10-CM | POA: Diagnosis not present

## 2024-03-26 DIAGNOSIS — I959 Hypotension, unspecified: Secondary | ICD-10-CM | POA: Diagnosis not present

## 2024-03-26 DIAGNOSIS — R6521 Severe sepsis with septic shock: Secondary | ICD-10-CM | POA: Diagnosis not present

## 2024-03-26 DIAGNOSIS — Z888 Allergy status to other drugs, medicaments and biological substances status: Secondary | ICD-10-CM

## 2024-03-26 DIAGNOSIS — W3400XS Accidental discharge from unspecified firearms or gun, sequela: Secondary | ICD-10-CM

## 2024-03-26 DIAGNOSIS — Z933 Colostomy status: Secondary | ICD-10-CM

## 2024-03-26 DIAGNOSIS — R0989 Other specified symptoms and signs involving the circulatory and respiratory systems: Secondary | ICD-10-CM | POA: Diagnosis not present

## 2024-03-26 DIAGNOSIS — N319 Neuromuscular dysfunction of bladder, unspecified: Secondary | ICD-10-CM | POA: Diagnosis present

## 2024-03-26 DIAGNOSIS — R6520 Severe sepsis without septic shock: Secondary | ICD-10-CM | POA: Diagnosis present

## 2024-03-26 DIAGNOSIS — E876 Hypokalemia: Secondary | ICD-10-CM | POA: Diagnosis not present

## 2024-03-26 DIAGNOSIS — I10 Essential (primary) hypertension: Secondary | ICD-10-CM | POA: Diagnosis not present

## 2024-03-26 DIAGNOSIS — D649 Anemia, unspecified: Secondary | ICD-10-CM | POA: Diagnosis not present

## 2024-03-26 DIAGNOSIS — B964 Proteus (mirabilis) (morganii) as the cause of diseases classified elsewhere: Secondary | ICD-10-CM | POA: Diagnosis not present

## 2024-03-26 DIAGNOSIS — M726 Necrotizing fasciitis: Secondary | ICD-10-CM | POA: Diagnosis not present

## 2024-03-26 DIAGNOSIS — R7881 Bacteremia: Secondary | ICD-10-CM | POA: Diagnosis not present

## 2024-03-26 DIAGNOSIS — Z89612 Acquired absence of left leg above knee: Secondary | ICD-10-CM

## 2024-03-26 DIAGNOSIS — E8809 Other disorders of plasma-protein metabolism, not elsewhere classified: Secondary | ICD-10-CM | POA: Diagnosis present

## 2024-03-26 DIAGNOSIS — L8990 Pressure ulcer of unspecified site, unspecified stage: Secondary | ICD-10-CM | POA: Insufficient documentation

## 2024-03-26 DIAGNOSIS — Z515 Encounter for palliative care: Secondary | ICD-10-CM

## 2024-03-26 DIAGNOSIS — B9689 Other specified bacterial agents as the cause of diseases classified elsewhere: Secondary | ICD-10-CM | POA: Diagnosis not present

## 2024-03-26 DIAGNOSIS — T83021A Displacement of indwelling urethral catheter, initial encounter: Secondary | ICD-10-CM | POA: Diagnosis present

## 2024-03-26 DIAGNOSIS — Z79899 Other long term (current) drug therapy: Secondary | ICD-10-CM

## 2024-03-26 DIAGNOSIS — Y846 Urinary catheterization as the cause of abnormal reaction of the patient, or of later complication, without mention of misadventure at the time of the procedure: Secondary | ICD-10-CM | POA: Diagnosis present

## 2024-03-26 DIAGNOSIS — J90 Pleural effusion, not elsewhere classified: Secondary | ICD-10-CM | POA: Diagnosis not present

## 2024-03-26 DIAGNOSIS — S24103S Unspecified injury at T7-T10 level of thoracic spinal cord, sequela: Secondary | ICD-10-CM

## 2024-03-26 DIAGNOSIS — A4181 Sepsis due to Enterococcus: Principal | ICD-10-CM | POA: Diagnosis present

## 2024-03-26 DIAGNOSIS — Z7401 Bed confinement status: Secondary | ICD-10-CM

## 2024-03-26 DIAGNOSIS — L89320 Pressure ulcer of left buttock, unstageable: Secondary | ICD-10-CM | POA: Diagnosis present

## 2024-03-26 DIAGNOSIS — L89150 Pressure ulcer of sacral region, unstageable: Secondary | ICD-10-CM | POA: Diagnosis not present

## 2024-03-26 DIAGNOSIS — L89154 Pressure ulcer of sacral region, stage 4: Secondary | ICD-10-CM | POA: Diagnosis not present

## 2024-03-26 DIAGNOSIS — L89890 Pressure ulcer of other site, unstageable: Secondary | ICD-10-CM | POA: Diagnosis present

## 2024-03-26 DIAGNOSIS — L089 Local infection of the skin and subcutaneous tissue, unspecified: Secondary | ICD-10-CM | POA: Diagnosis not present

## 2024-03-26 DIAGNOSIS — R000 Tachycardia, unspecified: Secondary | ICD-10-CM | POA: Diagnosis not present

## 2024-03-26 DIAGNOSIS — T148XXA Other injury of unspecified body region, initial encounter: Secondary | ICD-10-CM | POA: Diagnosis not present

## 2024-03-26 DIAGNOSIS — D72829 Elevated white blood cell count, unspecified: Secondary | ICD-10-CM | POA: Diagnosis not present

## 2024-03-26 DIAGNOSIS — A480 Gas gangrene: Secondary | ICD-10-CM | POA: Diagnosis present

## 2024-03-26 DIAGNOSIS — L89610 Pressure ulcer of right heel, unstageable: Secondary | ICD-10-CM | POA: Diagnosis present

## 2024-03-26 DIAGNOSIS — M4628 Osteomyelitis of vertebra, sacral and sacrococcygeal region: Secondary | ICD-10-CM | POA: Diagnosis present

## 2024-03-26 DIAGNOSIS — R578 Other shock: Secondary | ICD-10-CM | POA: Diagnosis not present

## 2024-03-26 DIAGNOSIS — R571 Hypovolemic shock: Secondary | ICD-10-CM | POA: Diagnosis present

## 2024-03-26 DIAGNOSIS — R531 Weakness: Secondary | ICD-10-CM | POA: Diagnosis not present

## 2024-03-26 DIAGNOSIS — K435 Parastomal hernia without obstruction or  gangrene: Secondary | ICD-10-CM | POA: Diagnosis present

## 2024-03-26 DIAGNOSIS — Z743 Need for continuous supervision: Secondary | ICD-10-CM | POA: Diagnosis not present

## 2024-03-26 DIAGNOSIS — R17 Unspecified jaundice: Secondary | ICD-10-CM | POA: Diagnosis not present

## 2024-03-26 DIAGNOSIS — Z7189 Other specified counseling: Secondary | ICD-10-CM | POA: Diagnosis not present

## 2024-03-26 DIAGNOSIS — E872 Acidosis, unspecified: Secondary | ICD-10-CM | POA: Diagnosis present

## 2024-03-26 DIAGNOSIS — Z6823 Body mass index (BMI) 23.0-23.9, adult: Secondary | ICD-10-CM

## 2024-03-26 DIAGNOSIS — E860 Dehydration: Secondary | ICD-10-CM | POA: Diagnosis present

## 2024-03-26 DIAGNOSIS — R197 Diarrhea, unspecified: Secondary | ICD-10-CM | POA: Diagnosis not present

## 2024-03-26 DIAGNOSIS — L89892 Pressure ulcer of other site, stage 2: Secondary | ICD-10-CM | POA: Diagnosis not present

## 2024-03-26 DIAGNOSIS — R5381 Other malaise: Secondary | ICD-10-CM | POA: Diagnosis not present

## 2024-03-26 DIAGNOSIS — Z0389 Encounter for observation for other suspected diseases and conditions ruled out: Secondary | ICD-10-CM | POA: Diagnosis not present

## 2024-03-26 DIAGNOSIS — B952 Enterococcus as the cause of diseases classified elsewhere: Secondary | ICD-10-CM

## 2024-03-26 LAB — LACTIC ACID, PLASMA
Lactic Acid, Venous: 1.7 mmol/L (ref 0.5–1.9)
Lactic Acid, Venous: 2.2 mmol/L (ref 0.5–1.9)

## 2024-03-26 LAB — COMPREHENSIVE METABOLIC PANEL WITH GFR
ALT: 12 U/L (ref 0–44)
AST: 24 U/L (ref 15–41)
Albumin: 1.8 g/dL — ABNORMAL LOW (ref 3.5–5.0)
Alkaline Phosphatase: 93 U/L (ref 38–126)
Anion gap: 14 (ref 5–15)
BUN: 42 mg/dL — ABNORMAL HIGH (ref 8–23)
CO2: 23 mmol/L (ref 22–32)
Calcium: 8.4 mg/dL — ABNORMAL LOW (ref 8.9–10.3)
Chloride: 99 mmol/L (ref 98–111)
Creatinine, Ser: 0.91 mg/dL (ref 0.61–1.24)
GFR, Estimated: >60 mL/min (ref >60)
Glucose, Bld: 74 mg/dL (ref 70–99)
Potassium: 4.3 mmol/L (ref 3.5–5.1)
Sodium: 136 mmol/L (ref 135–145)
Total Bilirubin: 1.2 mg/dL (ref 0.0–1.2)
Total Protein: 8.1 g/dL (ref 6.5–8.1)

## 2024-03-26 LAB — CBC WITH DIFFERENTIAL/PLATELET
Abs Immature Granulocytes: 0.52 K/uL — ABNORMAL HIGH (ref 0.00–0.07)
Band Neutrophils: 0 %
Basophils Absolute: 0 K/uL (ref 0.0–0.1)
Basophils Relative: 0 %
Blasts: 0 %
Eosinophils Absolute: 0 K/uL (ref 0.0–0.5)
Eosinophils Relative: 0 %
HCT: 26.1 % — ABNORMAL LOW (ref 39.0–52.0)
Hemoglobin: 7.5 g/dL — ABNORMAL LOW (ref 13.0–17.0)
Immature Granulocytes: 2 %
Lymphocytes Relative: 6 %
Lymphs Abs: 1.6 K/uL (ref 0.7–4.0)
MCH: 20.3 pg — ABNORMAL LOW (ref 26.0–34.0)
MCHC: 28.7 g/dL — ABNORMAL LOW (ref 30.0–36.0)
MCV: 70.5 fL — ABNORMAL LOW (ref 80.0–100.0)
Metamyelocytes Relative: 0 %
Monocytes Absolute: 1.3 K/uL — ABNORMAL HIGH (ref 0.1–1.0)
Monocytes Relative: 5 %
Myelocytes: 0 %
Neutro Abs: 22.7 K/uL — ABNORMAL HIGH (ref 1.7–7.7)
Neutrophils Relative %: 87 %
Other: 0 %
Platelets: 802 K/uL — ABNORMAL HIGH (ref 150–400)
Promyelocytes Relative: 0 %
RBC: 3.7 MIL/uL — ABNORMAL LOW (ref 4.22–5.81)
RDW: 21.2 % — ABNORMAL HIGH (ref 11.5–15.5)
Smear Review: NORMAL
WBC: 26.1 K/uL — ABNORMAL HIGH (ref 4.0–10.5)
nRBC: 0 % (ref 0.0–0.2)
nRBC: 0 /100{WBCs}

## 2024-03-26 LAB — GLUCOSE, CAPILLARY
Glucose-Capillary: 87 mg/dL (ref 70–99)
Glucose-Capillary: 85 mg/dL (ref 70–99)
Glucose-Capillary: 205 mg/dL — ABNORMAL HIGH (ref 70–99)

## 2024-03-26 LAB — URINALYSIS, W/ REFLEX TO CULTURE (INFECTION SUSPECTED)
Bilirubin Urine: NEGATIVE
Glucose, UA: NEGATIVE mg/dL
Ketones, ur: NEGATIVE mg/dL
Nitrite: NEGATIVE
Protein, ur: 30 mg/dL — AB
Specific Gravity, Urine: 1.014 (ref 1.005–1.030)
WBC, UA: >50 WBC/hpf (ref 0–5)
pH: 5 (ref 5.0–8.0)

## 2024-03-26 LAB — CBC
HCT: 17.3 % — ABNORMAL LOW (ref 39.0–52.0)
Hemoglobin: 5.1 g/dL — ABNORMAL LOW (ref 13.0–17.0)
MCH: 20.6 pg — ABNORMAL LOW (ref 26.0–34.0)
MCHC: 29.5 g/dL — ABNORMAL LOW (ref 30.0–36.0)
MCV: 70 fL — ABNORMAL LOW (ref 80.0–100.0)
Platelets: 628 K/uL — ABNORMAL HIGH (ref 150–400)
RBC: 2.47 MIL/uL — ABNORMAL LOW (ref 4.22–5.81)
RDW: 20.7 % — ABNORMAL HIGH (ref 11.5–15.5)
WBC: 20.4 K/uL — ABNORMAL HIGH (ref 4.0–10.5)
nRBC: 0 % (ref 0.0–0.2)

## 2024-03-26 LAB — RESP PANEL BY RT-PCR (RSV, FLU A&B, COVID)  RVPGX2
Influenza A by PCR: NEGATIVE
Influenza B by PCR: NEGATIVE
Resp Syncytial Virus by PCR: NEGATIVE
SARS Coronavirus 2 by RT PCR: NEGATIVE

## 2024-03-26 LAB — VITAMIN B12: Vitamin B-12: 545 pg/mL (ref 180–914)

## 2024-03-26 LAB — FOLATE: Folate: 8 ng/mL (ref >5.9)

## 2024-03-26 LAB — PROTIME-INR
INR: 1.3 — ABNORMAL HIGH (ref 0.8–1.2)
Prothrombin Time: 17.2 s — ABNORMAL HIGH (ref 11.4–15.2)

## 2024-03-26 LAB — CREATININE, SERUM
Creatinine, Ser: 0.73 mg/dL (ref 0.61–1.24)
GFR, Estimated: >60 mL/min (ref >60)

## 2024-03-26 LAB — HEMOGLOBIN AND HEMATOCRIT, BLOOD
HCT: 17.9 % — ABNORMAL LOW (ref 39.0–52.0)
Hemoglobin: 5.2 g/dL — ABNORMAL LOW (ref 13.0–17.0)

## 2024-03-26 LAB — PREPARE RBC (CROSSMATCH)

## 2024-03-26 MED ORDER — POLYETHYLENE GLYCOL 3350 17 G PO PACK: 17.0000 g | PACK | Freq: Every day | ORAL | Status: DC | PRN

## 2024-03-26 MED ORDER — SODIUM CHLORIDE 0.9 % IV SOLN
1.0000 g | Freq: Three times a day (TID) | INTRAVENOUS | Status: DC
  Administered 2024-03-26 – 2024-03-27 (×2): 1 g via INTRAVENOUS
  Filled 2024-03-26 (×3): qty 20

## 2024-03-26 MED ORDER — LACTATED RINGERS IV SOLN: INTRAVENOUS | Status: AC

## 2024-03-26 MED ORDER — ALBUMIN HUMAN 25 % IV SOLN
25.0000 g | Freq: Four times a day (QID) | INTRAVENOUS | Status: AC
  Administered 2024-03-26 – 2024-03-27 (×4): 25 g via INTRAVENOUS
  Filled 2024-03-26 (×4): qty 100

## 2024-03-26 MED ORDER — LINEZOLID 600 MG/300ML IV SOLN
600.0000 mg | Freq: Two times a day (BID) | INTRAVENOUS | Status: DC
  Administered 2024-03-26 – 2024-03-29 (×6): 600 mg via INTRAVENOUS
  Filled 2024-03-26 (×6): qty 300

## 2024-03-26 MED ORDER — IOHEXOL 300 MG/ML  SOLN
100.0000 mL | Freq: Once | INTRAMUSCULAR | Status: AC | PRN
  Administered 2024-03-26: 100 mL via INTRAVENOUS

## 2024-03-26 MED ORDER — LACTATED RINGERS IV BOLUS (SEPSIS)
1000.0000 mL | Freq: Once | INTRAVENOUS | Status: AC
  Administered 2024-03-26: 1000 mL via INTRAVENOUS

## 2024-03-26 MED ORDER — SODIUM CHLORIDE 0.9 % IV SOLN
3.0000 g | Freq: Four times a day (QID) | INTRAVENOUS | Status: DC
  Filled 2024-03-26 (×2): qty 8

## 2024-03-26 MED ORDER — ACETAMINOPHEN 500 MG PO TABS
1000.0000 mg | ORAL_TABLET | Freq: Three times a day (TID) | ORAL | Status: DC
  Administered 2024-03-26 – 2024-03-29 (×8): 1000 mg via ORAL
  Filled 2024-03-26 (×9): qty 2

## 2024-03-26 MED ORDER — IRON SUCROSE 300 MG IVPB - SIMPLE MED
300.0000 mg | Freq: Once | Status: AC
  Administered 2024-03-26: 300 mg via INTRAVENOUS
  Filled 2024-03-26: qty 300

## 2024-03-26 MED ORDER — SODIUM CHLORIDE 0.9% IV SOLUTION: Freq: Once | INTRAVENOUS | Status: AC

## 2024-03-26 MED ORDER — SODIUM CHLORIDE 0.9 % IV SOLN: 250.0000 mL | INTRAVENOUS | Status: AC

## 2024-03-26 MED ORDER — NOREPINEPHRINE 4 MG/250ML-% IV SOLN
0.0000 ug/min | INTRAVENOUS | Status: DC
Start: 2024-03-26 — End: 2024-03-31
  Administered 2024-03-26: 2 ug/min via INTRAVENOUS
  Administered 2024-03-27: 9 ug/min via INTRAVENOUS
  Administered 2024-03-27: 6 ug/min via INTRAVENOUS
  Administered 2024-03-27: 9 ug/min via INTRAVENOUS
  Administered 2024-03-28: 6 ug/min via INTRAVENOUS
  Administered 2024-03-29: 3 ug/min via INTRAVENOUS
  Filled 2024-03-26 (×4): qty 250
  Filled 2024-03-26: qty 1000

## 2024-03-26 MED ORDER — PIPERACILLIN-TAZOBACTAM 3.375 G IVPB 30 MIN: 3.3750 g | Freq: Once | INTRAVENOUS | Status: DC

## 2024-03-26 MED ORDER — POLYETHYLENE GLYCOL 3350 17 GM/SCOOP PO POWD: 17.0000 g | Freq: Every day | ORAL | Status: DC | PRN

## 2024-03-26 MED ORDER — ONDANSETRON 4 MG PO TBDP: 4.0000 mg | ORAL_TABLET | Freq: Three times a day (TID) | ORAL | Status: DC | PRN

## 2024-03-26 MED ORDER — VANCOMYCIN HCL 1250 MG/250ML IV SOLN
1250.0000 mg | Freq: Two times a day (BID) | INTRAVENOUS | Status: DC
  Filled 2024-03-26: qty 250

## 2024-03-26 MED ORDER — NOREPINEPHRINE 4 MG/250ML-% IV SOLN
INTRAVENOUS | Status: AC
  Filled 2024-03-26: qty 250

## 2024-03-26 MED ORDER — ENOXAPARIN SODIUM 40 MG/0.4ML IJ SOSY: 40.0000 mg | PREFILLED_SYRINGE | INTRAMUSCULAR | Status: DC

## 2024-03-26 MED ORDER — LACTATED RINGERS IV BOLUS
1000.0000 mL | Freq: Once | INTRAVENOUS | Status: AC
  Administered 2024-03-26: 1000 mL via INTRAVENOUS

## 2024-03-26 MED ORDER — VANCOMYCIN HCL 2000 MG/400ML IV SOLN
2000.0000 mg | Freq: Once | INTRAVENOUS | Status: AC
  Administered 2024-03-26: 2000 mg via INTRAVENOUS
  Filled 2024-03-26: qty 400

## 2024-03-26 MED ORDER — MIRTAZAPINE 15 MG PO TABS
7.5000 mg | ORAL_TABLET | Freq: Every day | ORAL | Status: DC
  Administered 2024-03-26 – 2024-04-09 (×15): 7.5 mg via ORAL
  Filled 2024-03-26 (×15): qty 1

## 2024-03-26 MED ORDER — SODIUM CHLORIDE 0.9 % IV SOLN
3.0000 g | Freq: Once | INTRAVENOUS | Status: AC
  Administered 2024-03-26: 3 g via INTRAVENOUS
  Filled 2024-03-26: qty 8
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
## 2024-03-26 NOTE — Consult Note (Signed)
 CODE SEPSIS - PHARMACY COMMUNICATION  **Broad Spectrum Antibiotics should be administered within 1 hour of Sepsis diagnosis**  Time Code Sepsis Called/Page Received: 1406  Antibiotics Ordered: Unasyn , Vancomycin   Time of 1st antibiotic administration: 1445  Additional action taken by pharmacy: n/a  If necessary, Name of Provider/Nurse Contacted: none   Bradley [name] PharmD, BCPS 03/26/2024 2:56 PM
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
## 2024-03-26 NOTE — ED Notes (Signed)
 Per MD Zhang, give 1L LR bolus now.

## 2024-03-26 NOTE — ED Notes (Signed)
 MD made aware of pt BP 87/33 at this time.

## 2024-03-26 NOTE — H&P (Addendum)
 History and Physical    Patient: Bradley [name] FMW:[id number] DOB: 1945-06-11 DOA: 03/26/2024 DOS: the patient was seen and examined on 03/26/2024 PCP: Alla Amis, MD  Patient coming from: Home  Chief Complaint:  Chief Complaint  Patient presents with   Wound Check   HPI: Bradley [name] is a 78 y.o. male with medical history significant of paraplegia from waist down, chronic sacral wound who came to the hospital with malodor and increased bleeding from the sacral wound. Upon arriving in the hospital, patient was hypotensive, heart rate 127, respirate 27, leukocytosis of 26.1, platelets 802, lactic acid 1.7.  Patient was given IV fluid bolus, vancomycin  and Unasyn . Patient also seen by general surgery, CT scan ordered, planning for debridement on Thursday.   Review of Systems: As mentioned in the history of present illness. All other systems reviewed and are negative. Past Medical History:  Diagnosis Date   Anemia    Arthritis    Paralysis of both lower limbs (HCC)    Sacral decubitus ulcer    Past Surgical History:  Procedure Laterality Date   COLONOSCOPY  06/06/2014   INCISION AND DRAINAGE OF WOUND N/A 06/04/2018   Procedure: IRRIGATION AND DEBRIDEMENT WOUND;  Surgeon: Desiderio Schanz, MD;  Location: [facility] ORS;  Service: General;  Laterality: N/A;   TONSILLECTOMY     WOUND DEBRIDEMENT Left 01/28/2023   Procedure: LEFT LEG DEBRIDEMENT OF ULCER NEAR GLUTEUS;  Surgeon: Tye Millet, DO;  Location: [facility] ORS;  Service: General;  Laterality: Left;   Social History:  reports that he quit smoking about 53 years ago. His smoking use included cigarettes. He started smoking about 58 years ago. He has a 2.5 pack-year smoking history. He has never used smokeless tobacco. He reports that he does not drink alcohol and does not use drugs.  Allergies  Allergen Reactions   Lisinopril Cough    Family History  Problem Relation Age of Onset   Breast cancer Mother     Prior to  Admission medications   Medication Sig Start Date End Date Taking? Authorizing Provider  Baclofen  5 MG TABS Take 5 mg by mouth in the morning and at bedtime. 09/22/23  Yes [provider]  mirtazapine (REMERON) 7.5 MG tablet Take 7.5 mg by mouth at bedtime. 03/15/24 03/15/25 Yes [provider]  potassium chloride  SA (KLOR-CON  M) 20 MEQ tablet Take 20 mEq by mouth daily. 03/15/24 03/15/25 Yes [provider]  zinc  sulfate, 50mg  elemental zinc , 220 (50 Zn) MG capsule Take 220 mg by mouth daily. 02/21/24 02/20/25 Yes [provider]  acetaminophen  (TYLENOL ) 500 MG tablet Take 1,000 mg by mouth 3 (three) times daily.     [provider]  AMBULATORY NON FORMULARY MEDICATION Patient needs size 36 condom catheters.  He uses two condom catheters along with collection bags daily. 08/08/23   Helon Kirsch A, PA-C  Multiple Vitamin (MULTIVITAMIN ADULT PO) Take 1 tablet by mouth daily.    [provider]  ondansetron  (ZOFRAN -ODT) 4 MG disintegrating tablet Take 1 tablet (4 mg total) by mouth every 8 (eight) hours as needed for nausea or vomiting. 01/06/24   Willo Dunnings, MD  polyethylene glycol powder (GLYCOLAX /MIRALAX ) powder Take 17 g by mouth daily as needed for mild constipation.     [provider]  vitamin C  (ASCORBIC ACID ) 500 MG tablet Take 500 mg by mouth 2 (two) times daily.    [provider]    Physical Exam: Vitals:   03/26/24 1405 03/26/24 1431  03/26/24 1500 03/26/24 1530  BP: (!) 89/49 (!) 120/51 99/63 (!) 124/57  Pulse: (!) 133 (!) 125 (!) 118 (!) 118  Resp: 19 16 20 20   Temp:      TempSrc:      SpO2: 100% 100% 100% 100%  Weight:      Height:       Physical Exam Constitutional:      General: He is not in acute distress.    Appearance: He is not toxic-appearing.  HENT:     Head: Normocephalic and atraumatic.     Nose: Nose normal. No congestion.     Mouth/Throat:     Mouth: Mucous membranes are moist.      Pharynx: Oropharynx is clear.  Eyes:     Extraocular Movements: Extraocular movements intact.     Conjunctiva/sclera: Conjunctivae normal.     Pupils: Pupils are equal, round, and reactive to light.  Neck:     Vascular: No carotid bruit.  Cardiovascular:     Rate and Rhythm: Tachycardia present.     Heart sounds: No murmur heard.    No gallop.  Pulmonary:     Effort: Pulmonary effort is normal. No respiratory distress.     Breath sounds: Normal breath sounds. No wheezing or rhonchi.  Abdominal:     General: Abdomen is flat. Bowel sounds are normal. There is no distension.     Palpations: Abdomen is soft.     Tenderness: There is no abdominal tenderness. There is no guarding.  Musculoskeletal:        General: No swelling or tenderness. Normal range of motion.     Cervical back: Normal range of motion and neck supple. No rigidity or tenderness.     Right lower leg: No edema.     Left lower leg: No edema.  Lymphadenopathy:     Cervical: No cervical adenopathy.  Skin:    General: Skin is warm and dry.     Coloration: Skin is not jaundiced.  Neurological:     Mental Status: He is alert and oriented to person, place, and time.     Cranial Nerves: No cranial nerve deficit.     Comments: Paraplegia from waist down.  Psychiatric:        Mood and Affect: Mood normal.        Thought Content: Thought content normal.     Data Reviewed:  Lab results as above Chest x-ray no acute changes  Assessment and Plan: Sacral wound with infection, possible osteomyelitis. Severe sepsis. Patient met sepsis criteria with tachycardia, tachypnea and significant leukocytosis.  He also had significant hypotension requiring fluid bolus.  Lactic acid level was normal. CT scan pending. Patient scheduled for debridement on Thursday.  Based on prior culture results, patient is put on vancomycin  and Unasyn .  Paraplegia. Severe debility. No treatment option.  Reactive thrombocytosis. Secondary to  severe sepsis.  Follow.  Iron  deficiency anemia Hb 7.5, will severe iron  deficiency, no active bleeding, will give iv iron , check B12 level.   Addendum: 8266.  Patient still hypotensive, Bp 84/40 after 4 L of IVF bolus, will transfer to ICU for presser.     Advance Care Planning:   Code Status: Full Code patient wished to be full code.  Consults: General surgery and ID, palliative care.  Family Communication: None  Severity of Illness: The appropriate patient status for this patient is INPATIENT. Inpatient status is judged to be reasonable and necessary in order to provide the required intensity of  service to ensure the patient's safety. The patient's presenting symptoms, physical exam findings, and initial radiographic and laboratory data in the context of their chronic comorbidities is felt to place them at high risk for further clinical deterioration. Furthermore, it is not anticipated that the patient will be medically stable for discharge from the hospital within 2 midnights of admission.   * I certify that at the point of admission it is my clinical judgment that the patient will require inpatient hospital care spanning beyond 2 midnights from the point of admission due to high intensity of service, high risk for further deterioration and high frequency of surveillance required.*  Author: Murvin Mana, MD 03/26/2024 3:35 PM  For on call review [email or web address].

## 2024-03-26 NOTE — ED Provider Notes (Signed)
 ED Sepsis - Repeat Assessment   Performed at:    2:45 PM  Last Vitals:    Blood pressure (!) 124/57, pulse (!) 118, temperature 99.3 F (37.4 C), temperature source Oral, resp. rate 20, height 6' 2 (1.88 m), weight 96 kg, SpO2 100%.   Improved.  Hemodynamics improving notably after fluid resuscitation.  Surgery team currently at the bedside.  Patient fully awake and alert having conversation with surgical team.  He appears improved   Dicky Anes, MD 03/26/24 1536

## 2024-03-26 NOTE — Consult Note (Signed)
 Pharmacy Antibiotic Note  Bradley [name] is a 78 y.o. male admitted on 03/26/2024 with wound infection.   PMH includes paraplegia from waist down, chronic sacral wound who came to the hospital with malodor and increased bleeding from the sacral wound. Pharmacy has been consulted for Vancomycin  dosing. ID consult pending.  Plan: Vancomycin  2000mg  IV x 1 dose today 10/21 at 1622 , then  Vancomycin  1250 mg IV Q 12 hrs. Goal AUC 400-550. Expected AUC: 540 Expected Cmin: 16.2 SCr used: 0.91   Height: 6' 2 (188 cm) Weight: 96 kg (211 lb 10.3 oz) IBW/kg (Calculated) : 82.2  Temp (24hrs), Avg:99.3 F (37.4 C), Min:99.3 F (37.4 C), Max:99.3 F (37.4 C)  Recent Labs  Lab 03/26/24 1402  WBC 26.1*  CREATININE 0.91  LATICACIDVEN 1.7    Estimated Creatinine Clearance: 75.3 mL/min (by C-G formula based on SCr of 0.91 mg/dL).    Allergies  Allergen Reactions   Lisinopril Cough    Antimicrobials this admission: Unasyn  10/21 >>  Vancomycin  10/21 >>   Dose adjustments this admission: n/a  Microbiology results: 10/21 BCx: collected  Thank you for allowing pharmacy to be a part of this patient's care.  Bunnie Rehberg Rodriguez-Guzman PharmD, BCPS 03/26/2024 3:59 PM

## 2024-03-26 NOTE — ED Triage Notes (Signed)
 First nurse note: pt arrived via EMS from home with c/o drainage / bleeding from decubitus uclers. Pt discharged home from facility about a months ago under the care of home health. Home health nurse called EMS during her visit with pt today; was not present at EMS arrival. Temp 100.1 per EMS
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
## 2024-03-26 NOTE — Progress Notes (Signed)
 Elink following for sepsis protocol.
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
## 2024-03-27 ENCOUNTER — Inpatient Hospital Stay (HOSPITAL_COMMUNITY)
Admit: 2024-03-27 | Discharge: 2024-03-27 | Disposition: A | Discharge status: Home / Self Care | Attending: Pulmonary Disease | Admitting: Pulmonary Disease

## 2024-03-27 ENCOUNTER — Inpatient Hospital Stay

## 2024-03-27 DIAGNOSIS — G8220 Paraplegia, unspecified: Secondary | ICD-10-CM | POA: Diagnosis not present

## 2024-03-27 DIAGNOSIS — B952 Enterococcus as the cause of diseases classified elsewhere: Secondary | ICD-10-CM

## 2024-03-27 DIAGNOSIS — I96 Gangrene, not elsewhere classified: Secondary | ICD-10-CM

## 2024-03-27 DIAGNOSIS — D72829 Elevated white blood cell count, unspecified: Secondary | ICD-10-CM

## 2024-03-27 DIAGNOSIS — S31000A Unspecified open wound of lower back and pelvis without penetration into retroperitoneum, initial encounter: Secondary | ICD-10-CM

## 2024-03-27 DIAGNOSIS — Z1621 Resistance to vancomycin: Secondary | ICD-10-CM

## 2024-03-27 DIAGNOSIS — R7881 Bacteremia: Secondary | ICD-10-CM | POA: Diagnosis not present

## 2024-03-27 DIAGNOSIS — A419 Sepsis, unspecified organism: Secondary | ICD-10-CM | POA: Diagnosis not present

## 2024-03-27 DIAGNOSIS — R578 Other shock: Secondary | ICD-10-CM | POA: Diagnosis not present

## 2024-03-27 DIAGNOSIS — A4181 Sepsis due to Enterococcus: Secondary | ICD-10-CM

## 2024-03-27 DIAGNOSIS — E8809 Other disorders of plasma-protein metabolism, not elsewhere classified: Secondary | ICD-10-CM

## 2024-03-27 DIAGNOSIS — L8990 Pressure ulcer of unspecified site, unspecified stage: Secondary | ICD-10-CM | POA: Insufficient documentation

## 2024-03-27 DIAGNOSIS — L89154 Pressure ulcer of sacral region, stage 4: Secondary | ICD-10-CM | POA: Diagnosis not present

## 2024-03-27 DIAGNOSIS — R6520 Severe sepsis without septic shock: Secondary | ICD-10-CM | POA: Diagnosis not present

## 2024-03-27 LAB — LACTIC ACID, PLASMA
Lactic Acid, Venous: 2.7 mmol/L (ref 0.5–1.9)
Lactic Acid, Venous: 4.1 mmol/L (ref 0.5–1.9)
Lactic Acid, Venous: 3.5 mmol/L (ref 0.5–1.9)

## 2024-03-27 LAB — ECHOCARDIOGRAM COMPLETE
AR max vel: 2.56 cm2
AV Area VTI: 2.45 cm2
AV Area mean vel: 2.4 cm2
AV Mean grad: 4 mmHg
AV Peak grad: 7.4 mmHg
Ao pk vel: 1.36 m/s
Area-P 1/2: 4.15 cm2
Calc EF: 63.2 %
Height: 74 in
MV VTI: 1.78 cm2
S' Lateral: 2.5 cm
Single Plane A2C EF: 54.4 %
Single Plane A4C EF: 68.4 %
Weight: 3386.27 [oz_av]

## 2024-03-27 LAB — BLOOD CULTURE ID PANEL (REFLEXED) - BCID2
A.calcoaceticus-baumannii: NOT DETECTED
Bacteroides fragilis: NOT DETECTED
Candida albicans: NOT DETECTED
Candida auris: NOT DETECTED
Candida glabrata: NOT DETECTED
Candida krusei: NOT DETECTED
Candida parapsilosis: NOT DETECTED
Candida tropicalis: NOT DETECTED
Cryptococcus neoformans/gattii: NOT DETECTED
Enterobacter cloacae complex: NOT DETECTED
Enterobacterales: NOT DETECTED
Enterococcus Faecium: NOT DETECTED
Enterococcus faecalis: DETECTED — AB
Escherichia coli: NOT DETECTED
Haemophilus influenzae: NOT DETECTED
Klebsiella aerogenes: NOT DETECTED
Klebsiella oxytoca: NOT DETECTED
Klebsiella pneumoniae: NOT DETECTED
Listeria monocytogenes: NOT DETECTED
Neisseria meningitidis: NOT DETECTED
Proteus species: NOT DETECTED
Pseudomonas aeruginosa: NOT DETECTED
Salmonella species: NOT DETECTED
Serratia marcescens: NOT DETECTED
Staphylococcus aureus (BCID): NOT DETECTED
Staphylococcus epidermidis: NOT DETECTED
Staphylococcus lugdunensis: NOT DETECTED
Staphylococcus species: NOT DETECTED
Stenotrophomonas maltophilia: NOT DETECTED
Streptococcus agalactiae: NOT DETECTED
Streptococcus pneumoniae: NOT DETECTED
Streptococcus pyogenes: NOT DETECTED
Streptococcus species: NOT DETECTED
Vancomycin resistance: DETECTED — AB

## 2024-03-27 LAB — COMPREHENSIVE METABOLIC PANEL WITH GFR
ALT: 9 U/L (ref 0–44)
AST: 25 U/L (ref 15–41)
Albumin: 2 g/dL — ABNORMAL LOW (ref 3.5–5.0)
Alkaline Phosphatase: 62 U/L (ref 38–126)
Anion gap: 10 (ref 5–15)
BUN: 27 mg/dL — ABNORMAL HIGH (ref 8–23)
CO2: 26 mmol/L (ref 22–32)
Calcium: 8.5 mg/dL — ABNORMAL LOW (ref 8.9–10.3)
Chloride: 105 mmol/L (ref 98–111)
Creatinine, Ser: 0.5 mg/dL — ABNORMAL LOW (ref 0.61–1.24)
GFR, Estimated: >60 mL/min (ref >60)
Glucose, Bld: 180 mg/dL — ABNORMAL HIGH (ref 70–99)
Potassium: 2.8 mmol/L — ABNORMAL LOW (ref 3.5–5.1)
Sodium: 141 mmol/L (ref 135–145)
Total Bilirubin: 3.1 mg/dL — ABNORMAL HIGH (ref 0.0–1.2)
Total Protein: 6.6 g/dL (ref 6.5–8.1)

## 2024-03-27 LAB — CBC
HCT: 27 % — ABNORMAL LOW (ref 39.0–52.0)
Hemoglobin: 8.4 g/dL — ABNORMAL LOW (ref 13.0–17.0)
MCH: 23.3 pg — ABNORMAL LOW (ref 26.0–34.0)
MCHC: 31.1 g/dL (ref 30.0–36.0)
MCV: 74.8 fL — ABNORMAL LOW (ref 80.0–100.0)
Platelets: 646 K/uL — ABNORMAL HIGH (ref 150–400)
RBC: 3.61 MIL/uL — ABNORMAL LOW (ref 4.22–5.81)
RDW: 22.5 % — ABNORMAL HIGH (ref 11.5–15.5)
WBC: 21.5 K/uL — ABNORMAL HIGH (ref 4.0–10.5)
nRBC: 0 % (ref 0.0–0.2)

## 2024-03-27 LAB — GLUCOSE, CAPILLARY
Glucose-Capillary: 179 mg/dL — ABNORMAL HIGH (ref 70–99)
Glucose-Capillary: 139 mg/dL — ABNORMAL HIGH (ref 70–99)
Glucose-Capillary: 206 mg/dL — ABNORMAL HIGH (ref 70–99)
Glucose-Capillary: 132 mg/dL — ABNORMAL HIGH (ref 70–99)
Glucose-Capillary: 117 mg/dL — ABNORMAL HIGH (ref 70–99)
Glucose-Capillary: 102 mg/dL — ABNORMAL HIGH (ref 70–99)

## 2024-03-27 LAB — PHOSPHORUS: Phosphorus: 3.4 mg/dL (ref 2.5–4.6)

## 2024-03-27 LAB — PROCALCITONIN: Procalcitonin: 2.68 ng/mL

## 2024-03-27 LAB — HEMOGLOBIN AND HEMATOCRIT, BLOOD
HCT: 26.6 % — ABNORMAL LOW (ref 39.0–52.0)
Hemoglobin: 8.2 g/dL — ABNORMAL LOW (ref 13.0–17.0)

## 2024-03-27 LAB — BRAIN NATRIURETIC PEPTIDE: B Natriuretic Peptide: 368.1 pg/mL — ABNORMAL HIGH (ref 0.0–100.0)

## 2024-03-27 LAB — PREPARE RBC (CROSSMATCH)

## 2024-03-27 LAB — CORTISOL: Cortisol, Plasma: 15.7 ug/dL

## 2024-03-27 LAB — POTASSIUM: Potassium: 3 mmol/L — ABNORMAL LOW (ref 3.5–5.1)

## 2024-03-27 LAB — MAGNESIUM: Magnesium: 2.1 mg/dL (ref 1.7–2.4)

## 2024-03-27 MED ORDER — ENSURE PLUS HIGH PROTEIN PO LIQD
237.0000 mL | Freq: Three times a day (TID) | ORAL | Status: DC
  Administered 2024-03-27 – 2024-04-10 (×36): 237 mL via ORAL

## 2024-03-27 MED ORDER — INSULIN ASPART 100 UNIT/ML IJ SOLN
0.0000 [IU] | Freq: Three times a day (TID) | INTRAMUSCULAR | Status: DC
  Administered 2024-03-27: 2 [IU] via SUBCUTANEOUS
  Filled 2024-03-27: qty 1

## 2024-03-27 MED ORDER — POTASSIUM CHLORIDE 20 MEQ PO PACK
40.0000 meq | PACK | ORAL | Status: AC
  Administered 2024-03-28 (×2): 40 meq via ORAL
  Filled 2024-03-27 (×2): qty 2

## 2024-03-27 MED ORDER — VITAMIN C 500 MG PO TABS
500.0000 mg | ORAL_TABLET | Freq: Two times a day (BID) | ORAL | Status: DC
  Administered 2024-03-27 – 2024-04-10 (×26): 500 mg via ORAL
  Filled 2024-03-27 (×28): qty 1

## 2024-03-27 MED ORDER — SODIUM CHLORIDE 0.9 % IV SOLN
1.0000 g | Freq: Three times a day (TID) | INTRAVENOUS | Status: DC
  Administered 2024-03-27 – 2024-03-29 (×7): 1 g via INTRAVENOUS
  Filled 2024-03-27 (×7): qty 20

## 2024-03-27 MED ORDER — ADULT MULTIVITAMIN W/MINERALS CH
1.0000 | ORAL_TABLET | Freq: Every day | ORAL | Status: DC
  Administered 2024-03-28 – 2024-04-10 (×13): 1 via ORAL
  Filled 2024-03-27 (×14): qty 1

## 2024-03-27 MED ORDER — INSULIN ASPART 100 UNIT/ML IJ SOLN: 0.0000 [IU] | Freq: Every day | INTRAMUSCULAR | Status: DC

## 2024-03-27 MED ORDER — POTASSIUM CHLORIDE 20 MEQ PO PACK
40.0000 meq | PACK | Freq: Once | ORAL | Status: AC
  Administered 2024-03-27: 40 meq via ORAL
  Filled 2024-03-27: qty 2

## 2024-03-27 MED ORDER — BIOTENE DRY MOUTH MT LIQD
15.0000 mL | OROMUCOSAL | Status: DC | PRN
  Administered 2024-03-27: 15 mL via OROMUCOSAL

## 2024-03-27 MED ORDER — MIDODRINE HCL 5 MG PO TABS
5.0000 mg | ORAL_TABLET | Freq: Three times a day (TID) | ORAL | Status: DC
  Administered 2024-03-27 (×2): 5 mg via ORAL
  Filled 2024-03-27 (×2): qty 1

## 2024-03-27 MED ORDER — CHLORHEXIDINE GLUCONATE CLOTH 2 % EX PADS
6.0000 | MEDICATED_PAD | Freq: Every day | CUTANEOUS | Status: DC
  Administered 2024-03-27 – 2024-04-10 (×15): 6 via TOPICAL

## 2024-03-27 MED ORDER — PANTOPRAZOLE SODIUM 40 MG IV SOLR
40.0000 mg | Freq: Two times a day (BID) | INTRAVENOUS | Status: DC
  Administered 2024-03-27 – 2024-03-29 (×6): 40 mg via INTRAVENOUS
  Filled 2024-03-27 (×6): qty 10

## 2024-03-27 MED ORDER — DAKINS (1/4 STRENGTH) 0.125 % EX SOLN
Freq: Two times a day (BID) | CUTANEOUS | Status: DC
  Filled 2024-03-27 (×2): qty 473

## 2024-03-27 MED ORDER — SODIUM CHLORIDE 0.9 % IV BOLUS
250.0000 mL | Freq: Once | INTRAVENOUS | Status: AC
  Administered 2024-03-27: 250 mL via INTRAVENOUS

## 2024-03-27 MED ORDER — POTASSIUM CHLORIDE 10 MEQ/100ML IV SOLN
10.0000 meq | INTRAVENOUS | Status: DC
  Administered 2024-03-27: 10 meq via INTRAVENOUS
  Filled 2024-03-27 (×2): qty 100

## 2024-03-27 MED ORDER — SODIUM CHLORIDE 0.9% IV SOLUTION: Freq: Once | INTRAVENOUS | Status: AC

## 2024-03-27 NOTE — IPAL (Signed)
  Interdisciplinary Goals of Care Family Meeting   Date carried out: 03/27/2024  Location of the meeting: Phone conference  Member's involved: Nurse Practitioner and Family Member or next of kin  Durable Power of Attorney or acting medical decision maker: Pt's son [name]   Discussion: We discussed goals of care for Bradley [name] .  We reviewed clinical course including Severe Sepsis with Septic Shock due to Enterococcus Faecalis (Vancomycin  resistant) BACTEREMIA due to infected chronic large sacral and right ischial wounds with concern for necrotizing fascitis, along with UTI.   Optimally would want to obtain source control with surgery, however surgery team feels that pursing surgery is futile as pt would likely not heal from the degree of debridement that would be required. However without debridement will have recurrent sepsis.  Initially pt declined surgery, however now is possibly considering.  Will have multidisciplinary team meeting tomorrow with pt's son including Surgery, ID, PCCM, and Palliative Care to discuss current situation and best course of action.   Code status:   Code Status: Full Code   Disposition: Continue current acute care  Time spent for the meeting: 15 minutes   Inge Lecher, AGACNP-BC [facility] Prefer epic messenger for cross cover needs If after hours, please call E-link  Inge JONETTA Lecher, NP  03/27/2024, 5:11 PM

## 2024-03-27 NOTE — Consult Note (Signed)
 NAME: Bradley [name]  DOB: [birth year]  MRN: [id number]  Date/Time: 03/27/2024 5:54 PM  REQUESTING PROVIDER: Thad Subjective:  REASON FOR CONSULT: sacral decubitus ? Bradley [name] is a 78 y.o. with a history of T7 spinal cord injury with paraplegia, neurogenic bladder , chronic sacral decubitus S/p colostomy, neurogenic bladder, chronic Foley, left AKA Recent hospitalization at [facility] between 02/09/2024 until 02/21/2024 for infection of the chronic sacral wound and the culture which was positive for MRSA Acinetobacter and mixed organisms and was treated medically with meropenem  1 g 9/-5 9/13 and vancomycin  2 g 9/ 6-/02/2012 and sent on minocycline 200 mg.  And during that admission was thought that he was not a candidate for any type of surgery including flap surgery and so was managed medically. Came To [facility] ED on 9/21 for a wound check because the wound had a malodor and was some bleeding. In the ED vitals BP of 84/75, temperature 99.3, pulse 128 and sats of 100%. WBC was 26.1, Hb 7.5, platelet 802 and creatinine of 0.91., Blood cultures were sent Patient was started on linezolid and Unasyn  which later was changed to Zosyn.  Admitted to ICU Seen by Dr. Jordis who had discussed with the family and felt that surgery was futile and risky   Past Medical History:  Diagnosis Date   Anemia    Arthritis    Paralysis of both lower limbs (HCC)    Sacral decubitus ulcer     Past Surgical History:  Procedure Laterality Date   COLONOSCOPY  06/06/2014   INCISION AND DRAINAGE OF WOUND N/A 06/04/2018   Procedure: IRRIGATION AND DEBRIDEMENT WOUND;  Surgeon: Desiderio Schanz, MD;  Location: [facility] ORS;  Service: General;  Laterality: N/A;   TONSILLECTOMY     WOUND DEBRIDEMENT Left 01/28/2023   Procedure: LEFT LEG DEBRIDEMENT OF ULCER NEAR GLUTEUS;  Surgeon: Tye Millet, DO;  Location: [facility] ORS;  Service: General;  Laterality: Left;    Social History   Socioeconomic History   Marital status: Widowed     Spouse name: Not on file   Number of children: Not on file   Years of education: Not on file   Highest education level: Not on file  Occupational History   Not on file  Tobacco Use   Smoking status: Former    Current packs/day: 0.00    Average packs/day: 0.5 packs/day for 5.0 years (2.5 ttl pk-yrs)    Types: Cigarettes    Start date: 31    Quit date: 55    Years since quitting: 53.8   Smokeless tobacco: Never  Vaping Use   Vaping status: Never Used  Substance and Sexual Activity   Alcohol use: No   Drug use: No   Sexual activity: Not Currently  Other Topics Concern   Not on file  Social History Narrative   Not on file   Social Drivers of Health   Financial Resource Strain: Low Risk  (02/13/2024)   Received from [facility]   Overall Financial Resource Strain (CARDIA)    How hard is it for you to pay for the very basics like food, housing, medical care, and heating?: Not very hard  Food Insecurity: No Food Insecurity (03/26/2024)   Hunger Vital Sign    Worried About Running Out of Food in the Last Year: Never true    Ran Out of Food in the Last Year: Never true  Transportation Needs: No Transportation Needs (03/26/2024)   PRAPARE - Transportation    Lack  of Transportation (Medical): No    Lack of Transportation (Non-Medical): No  Physical Activity: Not on file  Stress: Not on file  Social Connections: Unknown (03/26/2024)   Social Connection and Isolation Panel    Frequency of Communication with Friends and Family: More than three times a week    Frequency of Social Gatherings with Friends and Family: More than three times a week    Attends Religious Services: Patient declined    Database administrator or Organizations: Patient declined    Attends Banker Meetings: Patient declined    Marital Status: Widowed  Intimate Partner Violence: Not At Risk (03/26/2024)   Humiliation, Afraid, Rape, and Kick questionnaire    Fear of Current or Ex-Partner: No     Emotionally Abused: No    Physically Abused: No    Sexually Abused: No    Family History  Problem Relation Age of Onset   Breast cancer Mother    Allergies  Allergen Reactions   Lisinopril Cough   I? Current Facility-Administered Medications  Medication Dose Route Frequency Provider Last Rate Last Admin   0.9 %  sodium chloride  infusion  250 mL Intravenous Continuous Nelson, Dana G, NP       acetaminophen  (TYLENOL ) tablet 1,000 mg  1,000 mg Oral TID Laurita Pillion, MD   1,000 mg at 03/27/24 1609   ascorbic acid  (VITAMIN C ) tablet 500 mg  500 mg Oral BID Kasa, Kurian, MD       Chlorhexidine  Gluconate Cloth 2 % PADS 6 each  6 each Topical Daily Kasa, Kurian, MD   6 each at 03/27/24 0926   feeding supplement (ENSURE PLUS HIGH PROTEIN) liquid 237 mL  237 mL Oral TID BM Kasa, Kurian, MD   237 mL at 03/27/24 1437   insulin aspart (novoLOG) injection 0-15 Units  0-15 Units Subcutaneous TID WC Keene, Jeremiah D, NP   2 Units at 03/27/24 1616   insulin aspart (novoLOG) injection 0-5 Units  0-5 Units Subcutaneous QHS Keene, Jeremiah D, NP       linezolid (ZYVOX) IVPB 600 mg  600 mg Intravenous Q12H Rust-Chester, Jenita CROME, NP   Stopped at 03/27/24 1125   meropenem  (MERREM ) 1 g in sodium chloride  0.9 % 100 mL IVPB  1 g Intravenous Q8H ParkLeonor BROCKS, [place]   Stopped at 03/27/24 1643   midodrine (PROAMATINE) tablet 5 mg  5 mg Oral TID WC Keene, Jeremiah D, NP   5 mg at 03/27/24 1617   mirtazapine (REMERON) tablet 7.5 mg  7.5 mg Oral QHS Zhang, Dekui, MD   7.5 mg at 03/26/24 2205   [START ON 03/28/2024] multivitamin with minerals tablet 1 tablet  1 tablet Oral Daily Kasa, Kurian, MD       norepinephrine (LEVOPHED) 4mg  in (0.016 mg/mL) premix infusion  0-10 mcg/min Intravenous Titrated Nelson, Dana G, NP 30 mL/hr at 03/27/24 1733 8 mcg/min at 03/27/24 1733   ondansetron  (ZOFRAN -ODT) disintegrating tablet 4 mg  4 mg Oral Q8H PRN Laurita Pillion, MD       pantoprazole  (PROTONIX ) injection 40 mg   40 mg Intravenous Q12H Rust-Chester, Britton L, NP   40 mg at 03/27/24 0913   polyethylene glycol (MIRALAX  / GLYCOLAX ) packet 17 g  17 g Oral Daily PRN Zhang, Dekui, MD       sodium hypochlorite (DAKIN'S 1/4 STRENGTH) topical solution   Irrigation BID Schulz, Zachary R, PA-C   Given at 03/27/24 1229     Abtx:  Anti-infectives (  From admission, onward)    Start     Dose/Rate Route Frequency Ordered Stop   03/27/24 1400  meropenem  (MERREM ) 1 g in sodium chloride  0.9 % 100 mL IVPB        1 g 33.3 mL/hr over 180 Minutes Intravenous Every 8 hours 03/27/24 0756     03/27/24 0500  vancomycin  (VANCOREADY) IVPB 1250 mg/250 mL  Status:  Discontinued        1,250 mg 166.7 mL/hr over 90 Minutes Intravenous Every 12 hours 03/26/24 1629 03/26/24 1918   03/26/24 2200  linezolid (ZYVOX) IVPB 600 mg        600 mg 300 mL/hr over 60 Minutes Intravenous Every 12 hours 03/26/24 1918 04/05/24 2159   03/26/24 2200  meropenem  (MERREM ) 1 g in sodium chloride  0.9 % 100 mL IVPB  Status:  Discontinued        1 g 200 mL/hr over 30 Minutes Intravenous Every 8 hours 03/26/24 2042 03/27/24 0756   03/26/24 2100  Ampicillin -Sulbactam (UNASYN ) 3 g in sodium chloride  0.9 % 100 mL IVPB  Status:  Discontinued        3 g 200 mL/hr over 30 Minutes Intravenous Every 6 hours 03/26/24 1530 03/26/24 1918   03/26/24 2100  Ampicillin -Sulbactam (UNASYN ) 3 g in sodium chloride  0.9 % 100 mL IVPB  Status:  Discontinued        3 g 200 mL/hr over 30 Minutes Intravenous Every 6 hours 03/26/24 1939 03/26/24 2021   03/26/24 2015  piperacillin-tazobactam (ZOSYN) IVPB 3.375 g  Status:  Discontinued       Placed in And Linked Group   3.375 g 100 mL/hr over 30 Minutes Intravenous  Once 03/26/24 1918 03/26/24 1939   03/26/24 1445  vancomycin  (VANCOREADY) IVPB 2000 mg/400 mL        2,000 mg 200 mL/hr over 120 Minutes Intravenous  Once 03/26/24 1431 03/26/24 1827   03/26/24 1445  Ampicillin -Sulbactam (UNASYN ) 3 g in sodium chloride  0.9 % 100  mL IVPB        3 g 200 mL/hr over 30 Minutes Intravenous  Once 03/26/24 1431 03/26/24 1614       REVIEW OF SYSTEMS:  Const: negative fever, negative chills, negative weight loss Eyes: negative diplopia or visual changes, negative eye pain ENT: negative coryza, negative sore throat Resp: negative cough, hemoptysis, dyspnea Cards: negative for chest pain, palpitations, lower extremity edema GU: negative for frequency, dysuria and hematuria GI: Negative for abdominal pain, diarrhea, bleeding, constipation Skin: negative for rash and pruritus Heme: negative for easy bruising and gum/nose bleeding MS:  weakness Neurolo paraplegia Psych: negative for feelings of anxiety, depression  Endocrine: negative for thyroid , diabetes Allergy/Immunology-lisinopril.   Objective:  VITALS:  BP (!) 129/51   Pulse 64   Temp 97.6 F (36.4 C) (Oral)   Resp 20   Ht 6' 2 (1.88 m)   Wt 96 kg   SpO2 100%   BMI 27.17 kg/m   PHYSICAL EXAM:  General: Alert, cooperative, no distress, appears stated age.  Head: Normocephalic, without obvious abnormality, atraumatic. Eyes: Conjunctivae clear, anicteric sclerae. Pupils are equal ENT Nares normal. No drainage or sinus tenderness. Lips, mucosa, and tongue normal. No Thrush Neck: Supple, symmetrical, no adenopathy, thyroid : non tender no carotid bruit and no JVD. Back: Extensive sacral decubitus stage IV with necrotic tissue foul-smelling discharge, both buttocks, ischial area         lungs: Bilateral air entry Heart: Tachycardia Abdomen: Soft, colostomy Extremities: Left AKA Right heel  deep tissue injury   Lymph: Cervical, supraclavicular normal. Neurologic: Paraplegia   Pertinent Labs Lab Results CBC    Component Value Date/Time   WBC 21.5 (H) 03/27/2024 0544   RBC 3.61 (L) 03/27/2024 0544   HGB 8.3 (L) 03/27/2024 1220   HGB 7.3 (L) 02/07/2024 1321   HGB 8.2 (L) 05/16/2014 0411   HCT 26.7 (L) 03/27/2024 1220   HCT 26.4 (L)  05/16/2014 0411   PLT 646 (H) 03/27/2024 0544   PLT 558 (H) 02/07/2024 1321   PLT 424 05/16/2014 0411   MCV 74.8 (L) 03/27/2024 0544   MCV 70 (L) 05/16/2014 0411   MCH 23.3 (L) 03/27/2024 0544   MCHC 31.1 03/27/2024 0544   RDW 22.5 (H) 03/27/2024 0544   RDW 16.9 (H) 05/16/2014 0411   LYMPHSABS 1.6 03/26/2024 1402   LYMPHSABS 1.8 05/16/2014 0411   MONOABS 1.3 (H) 03/26/2024 1402   MONOABS 0.5 05/16/2014 0411   EOSABS 0.0 03/26/2024 1402   EOSABS 0.3 05/16/2014 0411   BASOSABS 0.0 03/26/2024 1402   BASOSABS 0.0 05/16/2014 0411       Latest Ref Rng & Units 03/27/2024    5:44 AM 03/26/2024    5:42 PM 03/26/2024    2:02 PM  CMP  Glucose 70 - 99 mg/dL 819   74   BUN 8 - 23 mg/dL 27   42   Creatinine 9.38 - 1.24 mg/dL 9.49  9.26  9.08   Sodium 135 - 145 mmol/L 141   136   Potassium 3.5 - 5.1 mmol/L 2.8   4.3   Chloride 98 - 111 mmol/L 105   99   CO2 22 - 32 mmol/L 26   23   Calcium 8.9 - 10.3 mg/dL 8.5   8.4   Total Protein 6.5 - 8.1 g/dL 6.6   8.1   Total Bilirubin 0.0 - 1.2 mg/dL 3.1   1.2   Alkaline Phos 38 - 126 U/L 62   93   AST 15 - 41 U/L 25   24   ALT 0 - 44 U/L 9   12       Microbiology: Recent Results (from the past 240 hours)  Resp panel by RT-PCR (RSV, Flu A&B, Covid) Anterior Nasal Swab     Status: None   Collection Time: 03/26/24  2:02 PM   Specimen: Anterior Nasal Swab  Result Value Ref Range Status   SARS Coronavirus 2 by RT PCR NEGATIVE NEGATIVE Final    Comment: (NOTE) SARS-CoV-2 target nucleic acids are NOT DETECTED.  The SARS-CoV-2 RNA is generally detectable in upper respiratory specimens during the acute phase of infection. The lowest concentration of SARS-CoV-2 viral copies this assay can detect is 138 copies/mL. A negative result does not preclude SARS-Cov-2 infection and should not be used as the sole basis for treatment or other patient management decisions. A negative result may occur with  improper specimen collection/handling,  submission of specimen other than nasopharyngeal swab, presence of viral mutation(s) within the areas targeted by this assay, and inadequate number of viral copies(<138 copies/mL). A negative result must be combined with clinical observations, patient history, and epidemiological information. The expected result is Negative.  Fact Sheet for Patients:  BloggerCourse.com  Fact Sheet for Healthcare Providers:  SeriousBroker.it  This test is no t yet approved or cleared by the [place]  FDA and  has been authorized for detection and/or diagnosis of SARS-CoV-2 by FDA under an Emergency Use Authorization (EUA). This EUA will remain  in effect (meaning this test can be used) for the duration of the COVID-19 declaration under Section 564(b)(1) of the Act, 21 U.S.C.section 360bbb-3(b)(1), unless the authorization is terminated  or revoked sooner.       Influenza A by PCR NEGATIVE NEGATIVE Final   Influenza B by PCR NEGATIVE NEGATIVE Final    Comment: (NOTE) The Xpert Xpress SARS-CoV-2/FLU/RSV plus assay is intended as an aid in the diagnosis of influenza from Nasopharyngeal swab specimens and should not be used as a sole basis for treatment. Nasal washings and aspirates are unacceptable for Xpert Xpress SARS-CoV-2/FLU/RSV testing.  Fact Sheet for Patients: BloggerCourse.com  Fact Sheet for Healthcare Providers: SeriousBroker.it  This test is not yet approved or cleared by the [place]  FDA and has been authorized for detection and/or diagnosis of SARS-CoV-2 by FDA under an Emergency Use Authorization (EUA). This EUA will remain in effect (meaning this test can be used) for the duration of the COVID-19 declaration under Section 564(b)(1) of the Act, 21 U.S.C. section 360bbb-3(b)(1), unless the authorization is terminated or revoked.     Resp Syncytial Virus by PCR NEGATIVE  NEGATIVE Final    Comment: (NOTE) Fact Sheet for Patients: BloggerCourse.com  Fact Sheet for Healthcare Providers: SeriousBroker.it  This test is not yet approved or cleared by the [place]  FDA and has been authorized for detection and/or diagnosis of SARS-CoV-2 by FDA under an Emergency Use Authorization (EUA). This EUA will remain in effect (meaning this test can be used) for the duration of the COVID-19 declaration under Section 564(b)(1) of the Act, 21 U.S.C. section 360bbb-3(b)(1), unless the authorization is terminated or revoked.  Performed at [facility], [address] [place], [place] [address]   Blood Culture (routine x 2)     Status: None (Preliminary result)   Collection Time: 03/26/24  2:03 PM   Specimen: BLOOD  Result Value Ref Range Status   Specimen Description BLOOD LEFT ANTECUBITAL  Final   Special Requests   Final    BOTTLES DRAWN AEROBIC AND ANAEROBIC Blood Culture adequate volume   Culture  Setup Time   Final    GRAM POSITIVE COCCI AEROBIC BOTTLE ONLY Organism ID to follow GRAM POSITIVE RODS ANAEROBIC BOTTLE ONLY CRITICAL RESULT CALLED TO, READ BACK BY AND VERIFIED WITH: TIFFANY GILCHRIST 03/27/24 9177 MW    Culture   Final    NO GROWTH < 24 HOURS Performed at [facility], [address], [place], [place] [address]    Report Status PENDING  Incomplete  Blood Culture (routine x 2)     Status: None (Preliminary result)   Collection Time: 03/26/24  2:03 PM   Specimen: BLOOD  Result Value Ref Range Status   Specimen Description BLOOD RIGHT ANTECUBITAL  Final   Special Requests   Final    BOTTLES DRAWN AEROBIC AND ANAEROBIC Blood Culture results may not be optimal due to an inadequate volume of blood received in culture bottles   Culture   Final    NO GROWTH < 24 HOURS Performed at [facility], [address] [place], [place] [address]    Report Status PENDING   Incomplete  Blood Culture ID Panel (Reflexed)     Status: Abnormal   Collection Time: 03/26/24  2:03 PM  Result Value Ref Range Status   Enterococcus faecalis DETECTED (A) NOT DETECTED Final    Comment: CRITICAL RESULT CALLED TO, READ BACK BY AND VERIFIED WITH: TIFFANY GILCHRIST 03/27/24 9177 MW    Enterococcus  Faecium NOT DETECTED NOT DETECTED Final   Listeria monocytogenes NOT DETECTED NOT DETECTED Final   Staphylococcus species NOT DETECTED NOT DETECTED Final   Staphylococcus aureus (BCID) NOT DETECTED NOT DETECTED Final   Staphylococcus epidermidis NOT DETECTED NOT DETECTED Final   Staphylococcus lugdunensis NOT DETECTED NOT DETECTED Final   Streptococcus species NOT DETECTED NOT DETECTED Final   Streptococcus agalactiae NOT DETECTED NOT DETECTED Final   Streptococcus pneumoniae NOT DETECTED NOT DETECTED Final   Streptococcus pyogenes NOT DETECTED NOT DETECTED Final   A.calcoaceticus-baumannii NOT DETECTED NOT DETECTED Final   Bacteroides fragilis NOT DETECTED NOT DETECTED Final   Enterobacterales NOT DETECTED NOT DETECTED Final   Enterobacter cloacae complex NOT DETECTED NOT DETECTED Final   Escherichia coli NOT DETECTED NOT DETECTED Final   Klebsiella aerogenes NOT DETECTED NOT DETECTED Final   Klebsiella oxytoca NOT DETECTED NOT DETECTED Final   Klebsiella pneumoniae NOT DETECTED NOT DETECTED Final   Proteus species NOT DETECTED NOT DETECTED Final   Salmonella species NOT DETECTED NOT DETECTED Final   Serratia marcescens NOT DETECTED NOT DETECTED Final   Haemophilus influenzae NOT DETECTED NOT DETECTED Final   Neisseria meningitidis NOT DETECTED NOT DETECTED Final   Pseudomonas aeruginosa NOT DETECTED NOT DETECTED Final   Stenotrophomonas maltophilia NOT DETECTED NOT DETECTED Final   Candida albicans NOT DETECTED NOT DETECTED Final   Candida auris NOT DETECTED NOT DETECTED Final   Candida glabrata NOT DETECTED NOT DETECTED Final   Candida krusei NOT DETECTED NOT DETECTED  Final   Candida parapsilosis NOT DETECTED NOT DETECTED Final   Candida tropicalis NOT DETECTED NOT DETECTED Final   Cryptococcus neoformans/gattii NOT DETECTED NOT DETECTED Final   Vancomycin  resistance DETECTED (A) NOT DETECTED Final    Comment: CRITICAL RESULT CALLED TO, READ BACK BY AND VERIFIED WITH: [facility] GILCHRIST 03/27/24 9177 MW Performed at [facility], [address] [place], [place] [address]       IMAGING RESULTS: CT abdomen pelvis shows lymphadenopathy in the inguinal chains left lower quadrant ostomy Large bilateral decubitus ulcer seen over the distal sacrum, coccyx and bilateral ischial Continued bony destruction of the left ischial tuberosity and dorsal margin of the distal sacrum and coccyx consistent with osteomyelitis.  Progressive bone destruction of the right ischial tuberosity consistent with osteomyelitis Extensive subcutaneous gas is seen overlying the right ischium with bilateral buttocks and extending into the central lower back consistent with necrotizing fasciitis from infection with gas-forming organism. I have personally reviewed the films ? Impression/Recommendation Enterococcus faecalis bacteremia Possible anaerobic bacteremia secondary to the severely infected, necrotic wound in the sacrum On linezolid and meropenem    Severe sepsis from stage IV sacral wounds The sacral wounds are necrotic with an element of necrotizing fasciitis and underlying osteomyelitis.  This is chronic because of his paraplegia but worse recently He was at [facility]  in September when surgery was deemed not possible.  Also antibiotics are futile in this situation except for the acute.  Of sepsis Currently patient is on linezolid and meropenem . If at all possible bedside debridement could be done to remove the superficial necrotic tissue  so that antibiotics may work better But this again may be futile.  We need to have a case conference with the son, patient and care  team and show them the wound or show the pictures and discuss the futility of care here Also treatment goals need to be addressed and palliative should be consulted  Leukocytosis secondary to the leukocytosis secondary to the infection  Hyperbilirubinemia due to sepsis  Hypoalbuminemia  Anemia  Paraplegia  Colostomy  Neurogenic bladder with chronic Foley  Left AKA  This consult involves complex antimicrobial management _______________________________________________ Discussed with patient, and care team Case conference with the son at 37 AM tomorrow. Note:  This document was prepared using Dragon voice recognition software and may include unintentional dictation errors.
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
## 2024-03-27 NOTE — Progress Notes (Signed)
   03/27/24 0645  Spiritual Encounters  Type of Visit Initial  Care provided to: Pt not available (Pt was sleeping)  Referral source Nurse (RN/NT/LPN)  Reason for visit  (Pt asked for prayer; prayed silently; will ask the oncoming chaplain to revisit him.)  OnCall Visit Yes  Spiritual Care Plan  Spiritual Care Issues Still Outstanding Referring to oncoming chaplain for further support

## 2024-03-27 NOTE — Plan of Care (Signed)
  Problem: Education: Goal: Knowledge of General Education information will improve Description: Including pain rating scale, medication(s)/side effects and non-pharmacologic comfort measures Outcome: Progressing   Problem: Health Behavior/Discharge Planning: Goal: Ability to manage health-related needs will improve Outcome: Progressing   Problem: Clinical Measurements: Goal: Ability to maintain clinical measurements within normal limits will improve Outcome: Progressing Goal: Will remain free from infection Outcome: Progressing Goal: Diagnostic test results will improve Outcome: Progressing Goal: Respiratory complications will improve Outcome: Progressing Goal: Cardiovascular complication will be avoided Outcome: Progressing   Problem: Activity: Goal: Risk for activity intolerance will decrease Outcome: Progressing   Problem: Nutrition: Goal: Adequate nutrition will be maintained Outcome: Progressing   Problem: Coping: Goal: Level of anxiety will decrease Outcome: Progressing   Problem: Elimination: Goal: Will not experience complications related to bowel motility Outcome: Progressing Goal: Will not experience complications related to urinary retention Outcome: Progressing   Problem: Pain Managment: Goal: General experience of comfort will improve and/or be controlled Outcome: Progressing   Problem: Safety: Goal: Ability to remain free from injury will improve Outcome: Progressing   Problem: Skin Integrity: Goal: Risk for impaired skin integrity will decrease Outcome: Progressing   Problem: Clinical Measurements: Goal: Ability to avoid or minimize complications of infection will improve Outcome: Progressing   Problem: Skin Integrity: Goal: Skin integrity will improve Outcome: Progressing   Problem: Education: Goal: Ability to describe self-care measures that may prevent or decrease complications (Diabetes Survival Skills Education) will improve Outcome:  Progressing Goal: Individualized Educational Video(s) Outcome: Progressing
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
## 2024-03-27 NOTE — Progress Notes (Signed)
   03/27/24 1000  Spiritual Encounters  Type of Visit Follow up  Care provided to: Patient  Referral source Chaplain team  Reason for visit Routine spiritual support  OnCall Visit No  Spiritual Framework  Presenting Themes Meaning/purpose/sources of inspiration  Interventions  Spiritual Care Interventions Made Established relationship of care and support;Compassionate presence;Encouragement;Prayer  Intervention Outcomes  Outcomes Connection to spiritual care   Patient was concerned about the amount of time they have left to live. They were looking for prayer for an extended life. Chaplain encouraged and provided comfort to the patient. Patient asked Chaplain to pray and the Chaplain accepted request and prayed. Patient was thankful and asked that the chaplain please come back.
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
## 2024-03-27 NOTE — Plan of Care (Signed)
 Consult noted for GOC. Up to see patient. ID is at bedside preparing to visit with patient and evaluate wounds. Nursing discusses providing the dressing changes earlier and the complexities of dressing changes and wound packing. PMT will reattempt visit tomorrow.

## 2024-03-27 NOTE — Progress Notes (Signed)
 PHARMACY - PHYSICIAN COMMUNICATION CRITICAL VALUE ALERT - BLOOD CULTURE IDENTIFICATION (BCID)  Bradley [name] is an 78 y.o. male who presented to [facility] on 03/26/2024 with a chief complaint of sacral wound infection  Assessment:  Lab called with GPC (aerobic bottle) and GPR (anaerobic bottle) from the same set (2nd set still pending) from 10/21 blood cx. BCID showing E. faecalis w/ Fleeta A/B detected (VRE). Patient is on Linezolid and meropenem . He is already on ID consult list.   Name of physician (or Provider) Contacted: Dr. Isaiah, Dr. Fayette  Current antibiotics:  Linezolid 600 mg q12h Meropenem  1 g q8h  Changes to prescribed antibiotics recommended:  No changes needed. ID consulted.   Results for orders placed or performed during the hospital encounter of 03/26/24  Blood Culture ID Panel (Reflexed) (Collected: 03/26/2024  2:03 PM)  Result Value Ref Range   Enterococcus faecalis DETECTED (A) NOT DETECTED   Enterococcus Faecium NOT DETECTED NOT DETECTED   Listeria monocytogenes NOT DETECTED NOT DETECTED   Staphylococcus species NOT DETECTED NOT DETECTED   Staphylococcus aureus (BCID) NOT DETECTED NOT DETECTED   Staphylococcus epidermidis NOT DETECTED NOT DETECTED   Staphylococcus lugdunensis NOT DETECTED NOT DETECTED   Streptococcus species NOT DETECTED NOT DETECTED   Streptococcus agalactiae NOT DETECTED NOT DETECTED   Streptococcus pneumoniae NOT DETECTED NOT DETECTED   Streptococcus pyogenes NOT DETECTED NOT DETECTED   A.calcoaceticus-baumannii NOT DETECTED NOT DETECTED   Bacteroides fragilis NOT DETECTED NOT DETECTED   Enterobacterales NOT DETECTED NOT DETECTED   Enterobacter cloacae complex NOT DETECTED NOT DETECTED   Escherichia coli NOT DETECTED NOT DETECTED   Klebsiella aerogenes NOT DETECTED NOT DETECTED   Klebsiella oxytoca NOT DETECTED NOT DETECTED   Klebsiella pneumoniae NOT DETECTED NOT DETECTED   Proteus species NOT DETECTED NOT DETECTED   Salmonella  species NOT DETECTED NOT DETECTED   Serratia marcescens NOT DETECTED NOT DETECTED   Haemophilus influenzae NOT DETECTED NOT DETECTED   Neisseria meningitidis NOT DETECTED NOT DETECTED   Pseudomonas aeruginosa NOT DETECTED NOT DETECTED   Stenotrophomonas maltophilia NOT DETECTED NOT DETECTED   Candida albicans NOT DETECTED NOT DETECTED   Candida auris NOT DETECTED NOT DETECTED   Candida glabrata NOT DETECTED NOT DETECTED   Candida krusei NOT DETECTED NOT DETECTED   Candida parapsilosis NOT DETECTED NOT DETECTED   Candida tropicalis NOT DETECTED NOT DETECTED   Cryptococcus neoformans/gattii NOT DETECTED NOT DETECTED   Vancomycin  resistance DETECTED (A) NOT DETECTED    Ransom Blanch PGY-1 Pharmacy Resident  [facility]  03/27/2024 3:37 PM
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
## 2024-03-28 ENCOUNTER — Ambulatory Visit: Admitting: Physician Assistant

## 2024-03-28 ENCOUNTER — Inpatient Hospital Stay: Attending: Oncology

## 2024-03-28 DIAGNOSIS — L89154 Pressure ulcer of sacral region, stage 4: Secondary | ICD-10-CM | POA: Diagnosis not present

## 2024-03-28 DIAGNOSIS — M726 Necrotizing fasciitis: Secondary | ICD-10-CM

## 2024-03-28 DIAGNOSIS — Z7189 Other specified counseling: Secondary | ICD-10-CM | POA: Diagnosis not present

## 2024-03-28 DIAGNOSIS — R7881 Bacteremia: Secondary | ICD-10-CM | POA: Diagnosis not present

## 2024-03-28 DIAGNOSIS — L089 Local infection of the skin and subcutaneous tissue, unspecified: Secondary | ICD-10-CM | POA: Diagnosis not present

## 2024-03-28 DIAGNOSIS — T148XXA Other injury of unspecified body region, initial encounter: Secondary | ICD-10-CM | POA: Diagnosis not present

## 2024-03-28 DIAGNOSIS — G8220 Paraplegia, unspecified: Secondary | ICD-10-CM | POA: Diagnosis not present

## 2024-03-28 DIAGNOSIS — B952 Enterococcus as the cause of diseases classified elsewhere: Secondary | ICD-10-CM | POA: Diagnosis not present

## 2024-03-28 DIAGNOSIS — B964 Proteus (mirabilis) (morganii) as the cause of diseases classified elsewhere: Secondary | ICD-10-CM

## 2024-03-28 LAB — TYPE AND SCREEN
ABO/RH(D): A NEG
Antibody Screen: NEGATIVE
Unit division: 0
Unit division: 0

## 2024-03-28 LAB — URINE CULTURE: Culture: >=100000 — AB

## 2024-03-28 LAB — BPAM RBC
Blood Product Expiration Date: 202510312359
Blood Product Expiration Date: 202510292359
ISSUE DATE / TIME: 202510220103
ISSUE DATE / TIME: 202510212148
Unit Type and Rh: 600
Unit Type and Rh: 600

## 2024-03-28 LAB — GLUCOSE, CAPILLARY
Glucose-Capillary: 96 mg/dL (ref 70–99)
Glucose-Capillary: 80 mg/dL (ref 70–99)
Glucose-Capillary: 86 mg/dL (ref 70–99)

## 2024-03-28 LAB — MAGNESIUM: Magnesium: 1.9 mg/dL (ref 1.7–2.4)

## 2024-03-28 LAB — RENAL FUNCTION PANEL
Albumin: 2.3 g/dL — ABNORMAL LOW (ref 3.5–5.0)
Anion gap: 11 (ref 5–15)
BUN: 20 mg/dL (ref 8–23)
CO2: 24 mmol/L (ref 22–32)
Calcium: 8.6 mg/dL — ABNORMAL LOW (ref 8.9–10.3)
Chloride: 101 mmol/L (ref 98–111)
Creatinine, Ser: 0.44 mg/dL — ABNORMAL LOW (ref 0.61–1.24)
GFR, Estimated: >60 mL/min (ref >60)
Glucose, Bld: 118 mg/dL — ABNORMAL HIGH (ref 70–99)
Phosphorus: 2.1 mg/dL — ABNORMAL LOW (ref 2.5–4.6)
Potassium: 3.1 mmol/L — ABNORMAL LOW (ref 3.5–5.1)
Sodium: 136 mmol/L (ref 135–145)

## 2024-03-28 LAB — LACTIC ACID, PLASMA: Lactic Acid, Venous: 3.3 mmol/L (ref 0.5–1.9)

## 2024-03-28 LAB — CBC
HCT: 27.1 % — ABNORMAL LOW (ref 39.0–52.0)
Hemoglobin: 8.4 g/dL — ABNORMAL LOW (ref 13.0–17.0)
MCH: 22.8 pg — ABNORMAL LOW (ref 26.0–34.0)
MCHC: 31 g/dL (ref 30.0–36.0)
MCV: 73.6 fL — ABNORMAL LOW (ref 80.0–100.0)
Platelets: 679 K/uL — ABNORMAL HIGH (ref 150–400)
RBC: 3.68 MIL/uL — ABNORMAL LOW (ref 4.22–5.81)
RDW: 22.7 % — ABNORMAL HIGH (ref 11.5–15.5)
WBC: 17.3 K/uL — ABNORMAL HIGH (ref 4.0–10.5)
nRBC: 0 % (ref 0.0–0.2)

## 2024-03-28 MED ORDER — MAGNESIUM SULFATE 2 GM/50ML IV SOLN
2.0000 g | Freq: Once | INTRAVENOUS | Status: AC
  Administered 2024-03-28: 2 g via INTRAVENOUS
  Filled 2024-03-28: qty 50

## 2024-03-28 MED ORDER — DAKINS (1/4 STRENGTH) 0.125 % EX SOLN
Freq: Two times a day (BID) | CUTANEOUS | Status: AC
  Filled 2024-03-28: qty 473
  Filled 2024-03-28 (×2): qty 1

## 2024-03-28 MED ORDER — MIDODRINE HCL 5 MG PO TABS
10.0000 mg | ORAL_TABLET | Freq: Three times a day (TID) | ORAL | Status: DC
  Administered 2024-03-28 – 2024-03-29 (×3): 10 mg via ORAL
  Filled 2024-03-28 (×3): qty 2

## 2024-03-28 MED ORDER — POTASSIUM & SODIUM PHOSPHATES 280-160-250 MG PO PACK
2.0000 | PACK | ORAL | Status: AC
  Administered 2024-03-28 (×2): 2 via ORAL
  Filled 2024-03-28 (×2): qty 2

## 2024-03-28 NOTE — Progress Notes (Signed)
 PHARMACY - PHYSICIAN COMMUNICATION CRITICAL VALUE ALERT - BLOOD CULTURE IDENTIFICATION (BCID)  Bradley [name] is an 78 y.o. male who presented to [facility] on 03/26/2024 with a chief complaint of sacral wound infection  Assessment:  Lab called with GPC (aerobic bottle) and GPR (anaerobic bottle) from the same set (2nd set still pending) from 10/21 blood cx. BCID showing E. faecalis w/ Fleeta A/B detected (VRE). Patient is on Linezolid and meropenem . He is already on ID consult list.   ADDENDUM/UPDATE 03/28/24 - BLOOD CX UPDATED TO INCLUDE GNR WITH P. MIRABILIS IDENTIFIED. SO FAR GPR HAS NOT BEEN IDENTIFIED ON CULTURE  Name of physician (or Provider) Contacted: Dr. Isaiah, Dr. Fayette  Current antibiotics:  Linezolid 600 mg q12h Meropenem  1 g q8h  Changes to prescribed antibiotics recommended:  No changes needed. ID consulted.   Results for orders placed or performed during the hospital encounter of 03/26/24  Blood Culture ID Panel (Reflexed) (Collected: 03/26/2024  2:03 PM)  Result Value Ref Range   Enterococcus faecalis DETECTED (A) NOT DETECTED   Enterococcus Faecium NOT DETECTED NOT DETECTED   Listeria monocytogenes NOT DETECTED NOT DETECTED   Staphylococcus species NOT DETECTED NOT DETECTED   Staphylococcus aureus (BCID) NOT DETECTED NOT DETECTED   Staphylococcus epidermidis NOT DETECTED NOT DETECTED   Staphylococcus lugdunensis NOT DETECTED NOT DETECTED   Streptococcus species NOT DETECTED NOT DETECTED   Streptococcus agalactiae NOT DETECTED NOT DETECTED   Streptococcus pneumoniae NOT DETECTED NOT DETECTED   Streptococcus pyogenes NOT DETECTED NOT DETECTED   A.calcoaceticus-baumannii NOT DETECTED NOT DETECTED   Bacteroides fragilis NOT DETECTED NOT DETECTED   Enterobacterales NOT DETECTED NOT DETECTED   Enterobacter cloacae complex NOT DETECTED NOT DETECTED   Escherichia coli NOT DETECTED NOT DETECTED   Klebsiella aerogenes NOT DETECTED NOT DETECTED   Klebsiella oxytoca  NOT DETECTED NOT DETECTED   Klebsiella pneumoniae NOT DETECTED NOT DETECTED   Proteus species NOT DETECTED NOT DETECTED   Salmonella species NOT DETECTED NOT DETECTED   Serratia marcescens NOT DETECTED NOT DETECTED   Haemophilus influenzae NOT DETECTED NOT DETECTED   Neisseria meningitidis NOT DETECTED NOT DETECTED   Pseudomonas aeruginosa NOT DETECTED NOT DETECTED   Stenotrophomonas maltophilia NOT DETECTED NOT DETECTED   Candida albicans NOT DETECTED NOT DETECTED   Candida auris NOT DETECTED NOT DETECTED   Candida glabrata NOT DETECTED NOT DETECTED   Candida krusei NOT DETECTED NOT DETECTED   Candida parapsilosis NOT DETECTED NOT DETECTED   Candida tropicalis NOT DETECTED NOT DETECTED   Cryptococcus neoformans/gattii NOT DETECTED NOT DETECTED   Vancomycin  resistance DETECTED (A) NOT DETECTED    Celestine Slovak, PharmD, BCPS, BCIDP Work Cell: [phone] 03/28/2024 11:49 AM   03/28/2024 11:48 AM
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
## 2024-03-28 NOTE — Consult Note (Addendum)
 Consultation Note Date: 03/28/2024   Patient Name: Bradley [name]  DOB: [birth year]  MRN: [id number]  Age / Sex: 78 y.o., male  PCP: Alla Amis, MD Referring Physician: Isaiah Scrivener, MD  Reason for Consultation: Establishing goals of care  HPI/Patient Profile: KEIRON IODICE is a 78 y.o. male with medical history significant of paraplegia from waist down since 1960's, with chronic sacral wound.  Presented to the hospital with malodor and increased bleeding from the sacral wound.  Patient admitted to ICU and antibiotics initiated for severe sepsis from stage IV sacral wounds. Patient also seen by general surgery, CT scan ordered, planning for debridement on Thursday.  Family meeting planned with ID, primary team, surgery service on 1023 to discuss risks and benefits and care moving forward.  Clinical Assessment and Goals of Care: Notes, labs, diagnostics reviewed.  Ongoing epic chat with primary team, surgery service, ID regarding care planning.  In to see patient.  No family at bedside.  Patient is resting in bed at this time as nursing is exiting room.  Patient discusses that he is aware of his options.  He states he understands that the goal of treatment would be to treat infection, not to heal the wounds.  He discusses that he has had paraplegia since the 1960s.  He discusses that he has been dealing with wounds over the years since the initial injury.  He states as he has gotten older, he is not as able to care for himself as he had been previously.  He states he is of [place] faith and believes that God is in control of what happens.  He states God has brought him this far, and he does not feel that he can give up on God.  When broaching goals of care, patient states he has 3 children, and he and his children have discussed his wishes in great detail.  With conversation, he states he does not want  to discuss his wishes with hospital staff or providers.  He elaborates that he understands that CPR is not like what you see on TV, just a few pumps on the chest and a person's back. He states he is aware of the risk of being on the ventilator and  having tubes coming out of your neck.  Again, he states this has all been discussed with his children and they would know what to do when the time comes.  Updated CCM and surgery service in depth on our discussion.  SUMMARY OF RECOMMENDATIONS   As patient does not wish to discuss goals of care, PMT will sign off at this time.  Please reconsult if patient wishes to discuss goals of care or if patient declines.  He advises his sons would be his surrogate decision makers.       Primary Diagnoses: Present on Admission:  Wound infection  Severe sepsis (HCC)  HTN (hypertension)  Paraplegia (HCC)  Sacral wound  Iron  deficiency anemia  Reactive thrombocytosis   I have reviewed the medical record, interviewed the  patient and family, and examined the patient. The following aspects are pertinent.  Past Medical History:  Diagnosis Date   Anemia    Arthritis    Paralysis of both lower limbs (HCC)    Sacral decubitus ulcer    Social History   Socioeconomic History   Marital status: Widowed    Spouse name: Not on file   Number of children: Not on file   Years of education: Not on file   Highest education level: Not on file  Occupational History   Not on file  Tobacco Use   Smoking status: Former    Current packs/day: 0.00    Average packs/day: 0.5 packs/day for 5.0 years (2.5 ttl pk-yrs)    Types: Cigarettes    Start date: 39    Quit date: 73    Years since quitting: 53.8   Smokeless tobacco: Never  Vaping Use   Vaping status: Never Used  Substance and Sexual Activity   Alcohol use: No   Drug use: No   Sexual activity: Not Currently  Other Topics Concern   Not on file  Social History Narrative   Not on file   Social  Drivers of Health   Financial Resource Strain: Low Risk  (02/13/2024)   Received from [facility]   Overall Financial Resource Strain (CARDIA)    How hard is it for you to pay for the very basics like food, housing, medical care, and heating?: Not very hard  Food Insecurity: No Food Insecurity (03/26/2024)   Hunger Vital Sign    Worried About Running Out of Food in the Last Year: Never true    Ran Out of Food in the Last Year: Never true  Transportation Needs: No Transportation Needs (03/26/2024)   PRAPARE - Administrator, Civil Service (Medical): No    Lack of Transportation (Non-Medical): No  Physical Activity: Not on file  Stress: Not on file  Social Connections: Unknown (03/26/2024)   Social Connection and Isolation Panel    Frequency of Communication with Friends and Family: More than three times a week    Frequency of Social Gatherings with Friends and Family: More than three times a week    Attends Religious Services: Patient declined    Database administrator or Organizations: Patient declined    Attends Banker Meetings: Patient declined    Marital Status: Widowed   Family History  Problem Relation Age of Onset   Breast cancer Mother    Scheduled Meds:  acetaminophen   1,000 mg Oral TID   vitamin C   500 mg Oral BID   Chlorhexidine  Gluconate Cloth  6 each Topical Daily   feeding supplement  237 mL Oral TID BM   insulin aspart  0-15 Units Subcutaneous TID WC   insulin aspart  0-5 Units Subcutaneous QHS   midodrine  10 mg Oral TID WC   mirtazapine  7.5 mg Oral QHS   multivitamin with minerals  1 tablet Oral Daily   pantoprazole  (PROTONIX ) IV  40 mg Intravenous Q12H   sodium hypochlorite   Irrigation BID   Continuous Infusions:  linezolid (ZYVOX) IV Stopped (03/28/24 1032)   magnesium sulfate bolus IVPB     meropenem  (MERREM ) 1 g in sodium chloride  0.9 % 100 mL IVPB Stopped (03/28/24 0900)   norepinephrine (LEVOPHED) Adult infusion 6  mcg/min (03/28/24 1041)   PRN Meds:.antiseptic oral rinse, ondansetron , polyethylene glycol Medications Prior to Admission:  Prior to Admission medications  Medication Sig Start Date End Date Taking? Authorizing Provider  acetaminophen  (TYLENOL ) 500 MG tablet Take 1,000 mg by mouth 3 (three) times daily.    Yes [provider]  Baclofen  5 MG TABS Take 5 mg by mouth in the morning and at bedtime. 09/22/23  Yes [provider]  chlorthalidone  (HYGROTON ) 25 MG tablet Take 25 mg by mouth daily.   Yes [provider]  mirtazapine (REMERON) 7.5 MG tablet Take 7.5 mg by mouth at bedtime. 03/15/24 03/15/25 Yes [provider]  Multiple Vitamin (MULTIVITAMIN ADULT PO) Take 1 tablet by mouth daily.   Yes [provider]  polyethylene glycol powder (GLYCOLAX /MIRALAX ) powder Take 17 g by mouth daily as needed for mild constipation.    Yes [provider]  potassium chloride  SA (KLOR-CON  M) 20 MEQ tablet Take 20 mEq by mouth daily. 03/15/24 03/15/25 Yes [provider]  vitamin C  (ASCORBIC ACID ) 500 MG tablet Take 500 mg by mouth 2 (two) times daily.   Yes [provider]  zinc  sulfate, 50mg  elemental zinc , 220 (50 Zn) MG capsule Take 220 mg by mouth daily. 02/21/24 02/20/25 Yes [provider]  AMBULATORY NON FORMULARY MEDICATION Patient needs size 36 condom catheters.  He uses two condom catheters along with collection bags daily. 08/08/23   Helon Kirsch A, PA-C  ondansetron  (ZOFRAN -ODT) 4 MG disintegrating tablet Take 1 tablet (4 mg total) by mouth every 8 (eight) hours as needed for nausea or vomiting. Patient not taking: Reported on 03/27/2024 01/06/24   Willo Dunnings, MD   Allergies  Allergen Reactions   Lisinopril Cough   Review of Systems  All other systems reviewed and are negative.   Physical Exam Pulmonary:     Effort: Pulmonary effort is normal.  Skin:    General: Skin is warm and dry.  Neurological:      Mental Status: He is alert.     Vital Signs: BP (!) 79/70   Pulse 85   Temp 98.2 F (36.8 C) (Oral)   Resp (!) 26   Ht 6' 2 (1.88 m)   Wt 82.3 kg   SpO2 99%   BMI 23.30 kg/m  Pain Scale: Not given for pain   Pain Score: 0-No pain   SpO2: SpO2: 99 % O2 Device:SpO2: 99 % O2 Flow Rate: .   IO: Intake/output summary:  Intake/Output Summary (Last 24 hours) at 03/28/2024 1235 Last data filed at 03/28/2024 1041 Gross per 24 hour  Intake 2369.24 ml  Output 2375 ml  Net -5.76 ml    LBM: Last BM Date : 03/27/24 Baseline Weight: Weight: 96 kg Most recent weight: Weight: 82.3 kg      Signed by: Camelia Lewis, NP   Please contact Palliative Medicine Team phone at [phone] for questions and concerns.  For individual provider: See Tracey

## 2024-03-28 NOTE — Care Management Important Message (Signed)
 Important Message  Patient Details  Name: KOL CONSUEGRA MRN: [id number] Date of Birth: 26-May-1946   Important Message Given:  Yes - Medicare IM     Rojelio [name] 03/28/2024, 1:46 PM

## 2024-03-28 NOTE — Consult Note (Addendum)
 WOC Nurse Consult Note: Reason for Consult:sacral wound  large, more complex, wounds which will likely not heal given his bed bound state, malnutrition, and decondition- per general surgery.  Last noted that patient and family have declined surgical intervention at this time which seems very feasible in my opinion as well.  Wound type: Unstageable Pressure Injury; sacrum Unstageable Pressure Injury: right ischium Unstageable Pressure Injury: left ischium  Pressure Injury POA: Yes Measurement: see nursing flow sheets Wound bed:100% necrotic  Drainage (amount, consistency, odor) foul per MD and surgery notes Periwound: intact with evidence of scarring from other wounds  Dressing procedure/placement/frequency: Cleanse bilateral ischial and sacral wounds with saline, pat dry. Moisten (not soaked) 1/4% Dakins gauze packing to the sacral wound and covering the bilateral ischial wounds, top with ABD pads and secure with tape. Change BID LALM while in the ICU; order if patient transfers from the ICU; consider ordering as DME if DC to home or SNF   Re consult if needed, will not follow at this time. Thanks  Aizik Reh [employer], RN,CWOCN, CNS, [employer] [phone]
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
## 2024-03-28 NOTE — Progress Notes (Signed)
 PHARMACY CONSULT NOTE - ELECTROLYTES  Pharmacy Consult for Electrolyte Monitoring and Replacement   Recent Labs: Height: 6' 2 (188 cm) Weight: 82.3 kg (181 lb 7 oz) IBW/kg (Calculated) : 82.2 Estimated Creatinine Clearance: 85.6 mL/min (A) (by C-G formula based on SCr of 0.44 mg/dL (L)). Potassium (mmol/L)  Date Value  03/28/2024 3.1 (L)  05/16/2014 3.1 (L)   Magnesium (mg/dL)  Date Value  89/76/7974 1.9  06/17/2011 2.3   Calcium (mg/dL)  Date Value  89/76/7974 8.6 (L)   Calcium, Total (mg/dL)  Date Value  87/88/7984 7.9 (L)   Albumin (g/dL)  Date Value  89/76/7974 2.3 (L)  05/13/2014 2.5 (L)   Phosphorus (mg/dL)  Date Value  89/76/7974 2.1 (L)   Sodium (mmol/L)  Date Value  03/28/2024 136  05/16/2014 142   Corrected Ca: 9.3 mg/dL  Assessment  Bradley [name] is a 78 y.o. male presenting with severe sepsis and septic shock due to VRE bacteremia from infected wounds. PMH significant for paraplegia requiring chronic foley catheter and colostomy. Pharmacy has been consulted to monitor and replace electrolytes.  Diet: Regular diet  Pertinent medications: n/a  Goal of Therapy: Electrolytes WNL  Plan:  K 3.1 - Received Kcl 40mEq packet x 2 this AM from provider Phos 2.1 - Phos-NaK 280-160-250mg  packet x 2 ordered by provider this AM Check renal function panel and Mg with AM labs  Thank you for allowing pharmacy to be a part of this patient's care.  Bernardino George, PharmD Candidate [phone] [facility] [employer] 03/28/2024 7:53 AM

## 2024-03-28 NOTE — Progress Notes (Signed)
 Patient seen and examined.  Multidisciplinary conference held with Dr. Isaiah, Bradley [name] , pt himseld and Nephew.  His son Bradley [name] was supposed to be here at 11 AM we did not see him or heard from him. We had an extensive discussion about his disease process.  The fact that the sacral decubitus is still extensive and deep roughly covers about 30% of his total body surface area.  With a debridement down to the muscle he will be of devastating injury that I do not think he will survive it.  He is clear that he does not want a tracheostomy. Doing major interventions may require very invasive modes of ventilation during the surgery and postoperatively given with the potential for trach if a airway is emergently needed. I think that the risk of doing surgery is far greater than the benefit.  I will abide by the principal to no harm. I was empathetic with patient and family also provided careful listening. At this point what we can offer is palliative measures. He perfectly understands this.  He wishes to continue antibiotics and fluid for now. We will be available but at this time we will not plan to do any surgical interventions. Please  note that  I spent 50 minutes in this encounter including extensive review of medical records, imaging studies, coordinating his care, providing counseling and performing the patient

## 2024-03-29 ENCOUNTER — Inpatient Hospital Stay: Admitting: Oncology

## 2024-03-29 ENCOUNTER — Inpatient Hospital Stay

## 2024-03-29 DIAGNOSIS — T148XXA Other injury of unspecified body region, initial encounter: Secondary | ICD-10-CM | POA: Diagnosis not present

## 2024-03-29 DIAGNOSIS — R6521 Severe sepsis with septic shock: Secondary | ICD-10-CM | POA: Diagnosis not present

## 2024-03-29 DIAGNOSIS — A419 Sepsis, unspecified organism: Secondary | ICD-10-CM | POA: Diagnosis not present

## 2024-03-29 DIAGNOSIS — B952 Enterococcus as the cause of diseases classified elsewhere: Secondary | ICD-10-CM | POA: Diagnosis not present

## 2024-03-29 DIAGNOSIS — R7881 Bacteremia: Secondary | ICD-10-CM | POA: Diagnosis not present

## 2024-03-29 DIAGNOSIS — G8220 Paraplegia, unspecified: Secondary | ICD-10-CM | POA: Diagnosis not present

## 2024-03-29 DIAGNOSIS — L089 Local infection of the skin and subcutaneous tissue, unspecified: Secondary | ICD-10-CM | POA: Diagnosis not present

## 2024-03-29 DIAGNOSIS — M726 Necrotizing fasciitis: Secondary | ICD-10-CM | POA: Diagnosis not present

## 2024-03-29 LAB — RENAL FUNCTION PANEL
Albumin: 2 g/dL — ABNORMAL LOW (ref 3.5–5.0)
Anion gap: 12 (ref 5–15)
BUN: 15 mg/dL (ref 8–23)
CO2: 26 mmol/L (ref 22–32)
Calcium: 9.3 mg/dL (ref 8.9–10.3)
Chloride: 102 mmol/L (ref 98–111)
Creatinine, Ser: 0.46 mg/dL — ABNORMAL LOW (ref 0.61–1.24)
GFR, Estimated: >60 mL/min (ref >60)
Glucose, Bld: 63 mg/dL — ABNORMAL LOW (ref 70–99)
Phosphorus: 2.3 mg/dL — ABNORMAL LOW (ref 2.5–4.6)
Potassium: 4.6 mmol/L (ref 3.5–5.1)
Sodium: 140 mmol/L (ref 135–145)

## 2024-03-29 LAB — CBC
HCT: 31.5 % — ABNORMAL LOW (ref 39.0–52.0)
Hemoglobin: 9.3 g/dL — ABNORMAL LOW (ref 13.0–17.0)
MCH: 22.6 pg — ABNORMAL LOW (ref 26.0–34.0)
MCHC: 29.5 g/dL — ABNORMAL LOW (ref 30.0–36.0)
MCV: 76.6 fL — ABNORMAL LOW (ref 80.0–100.0)
Platelets: 680 K/uL — ABNORMAL HIGH (ref 150–400)
RBC: 4.11 MIL/uL — ABNORMAL LOW (ref 4.22–5.81)
RDW: 24.9 % — ABNORMAL HIGH (ref 11.5–15.5)
WBC: 17.2 K/uL — ABNORMAL HIGH (ref 4.0–10.5)
nRBC: 0 % (ref 0.0–0.2)

## 2024-03-29 LAB — CULTURE, BLOOD (ROUTINE X 2)
Culture: NO GROWTH — AB
Special Requests: ADEQUATE

## 2024-03-29 LAB — GLUCOSE, CAPILLARY
Glucose-Capillary: 62 mg/dL — ABNORMAL LOW (ref 70–99)
Glucose-Capillary: 62 mg/dL — ABNORMAL LOW (ref 70–99)
Glucose-Capillary: 116 mg/dL — ABNORMAL HIGH (ref 70–99)
Glucose-Capillary: 85 mg/dL (ref 70–99)
Glucose-Capillary: 107 mg/dL — ABNORMAL HIGH (ref 70–99)

## 2024-03-29 LAB — MAGNESIUM: Magnesium: 2.3 mg/dL (ref 1.7–2.4)

## 2024-03-29 MED ORDER — LACTATED RINGERS IV BOLUS
500.0000 mL | Freq: Once | INTRAVENOUS | Status: AC
  Administered 2024-03-29: 500 mL via INTRAVENOUS

## 2024-03-29 MED ORDER — DEXTROSE 50 % IV SOLN
INTRAVENOUS | Status: AC
  Administered 2024-03-29: 50 mL via INTRAVENOUS
  Filled 2024-03-29: qty 50

## 2024-03-29 MED ORDER — PANTOPRAZOLE SODIUM 40 MG PO TBEC
40.0000 mg | DELAYED_RELEASE_TABLET | Freq: Two times a day (BID) | ORAL | Status: DC
  Administered 2024-03-29 – 2024-04-10 (×23): 40 mg via ORAL
  Filled 2024-03-29 (×24): qty 1

## 2024-03-29 MED ORDER — OXYCODONE-ACETAMINOPHEN 5-325 MG PO TABS
1.0000 | ORAL_TABLET | Freq: Four times a day (QID) | ORAL | Status: DC | PRN
  Filled 2024-03-29: qty 1
  Filled 2024-03-29: qty 2

## 2024-03-29 MED ORDER — POTASSIUM & SODIUM PHOSPHATES 280-160-250 MG PO PACK
2.0000 | PACK | Freq: Once | ORAL | Status: AC
  Administered 2024-03-29: 2 via ORAL
  Filled 2024-03-29: qty 2

## 2024-03-29 MED ORDER — HYDROCORTISONE SOD SUC (PF) 100 MG IJ SOLR
100.0000 mg | Freq: Three times a day (TID) | INTRAMUSCULAR | Status: DC
  Administered 2024-03-29 – 2024-04-01 (×9): 100 mg via INTRAVENOUS
  Filled 2024-03-29 (×10): qty 2

## 2024-03-29 MED ORDER — SODIUM CHLORIDE 0.9 % IV SOLN
3.0000 g | Freq: Four times a day (QID) | INTRAVENOUS | Status: DC
  Administered 2024-03-29 – 2024-04-09 (×43): 3 g via INTRAVENOUS
  Filled 2024-03-29 (×45): qty 8

## 2024-03-29 MED ORDER — DEXTROSE 50 % IV SOLN: 50.0000 mL | Freq: Once | INTRAVENOUS | Status: AC

## 2024-03-29 MED ORDER — MIDODRINE HCL 5 MG PO TABS
15.0000 mg | ORAL_TABLET | Freq: Three times a day (TID) | ORAL | Status: DC
  Administered 2024-03-29 – 2024-04-02 (×13): 15 mg via ORAL
  Filled 2024-03-29 (×14): qty 3

## 2024-03-29 MED ORDER — ENOXAPARIN SODIUM 40 MG/0.4ML IJ SOSY
40.0000 mg | PREFILLED_SYRINGE | INTRAMUSCULAR | Status: DC
  Administered 2024-03-29 – 2024-04-09 (×12): 40 mg via SUBCUTANEOUS
  Filled 2024-03-29 (×12): qty 0.4

## 2024-03-29 MED ORDER — MIDODRINE HCL 5 MG PO TABS: 15.0000 mg | ORAL_TABLET | Freq: Three times a day (TID) | ORAL | Status: DC

## 2024-03-29 MED ORDER — ACETAMINOPHEN 500 MG PO TABS
1000.0000 mg | ORAL_TABLET | Freq: Three times a day (TID) | ORAL | Status: DC | PRN
  Administered 2024-04-09: 1000 mg via ORAL
  Filled 2024-03-29: qty 2

## 2024-03-29 NOTE — Progress Notes (Signed)
 PHARMACY CONSULT NOTE - ELECTROLYTES  Pharmacy Consult for Electrolyte Monitoring and Replacement   Recent Labs: Height: 6' 2 (188 cm) Weight: 79.7 kg (175 lb 11.3 oz) IBW/kg (Calculated) : 82.2 Estimated Creatinine Clearance: 83 mL/min (A) (by C-G formula based on SCr of 0.46 mg/dL (L)). Potassium (mmol/L)  Date Value  03/29/2024 4.6  05/16/2014 3.1 (L)   Magnesium (mg/dL)  Date Value  89/75/7974 2.3  06/17/2011 2.3   Calcium (mg/dL)  Date Value  89/75/7974 9.3   Calcium, Total (mg/dL)  Date Value  87/88/7984 7.9 (L)   Albumin (g/dL)  Date Value  89/75/7974 2.0 (L)  05/13/2014 2.5 (L)   Phosphorus (mg/dL)  Date Value  89/75/7974 2.3 (L)   Sodium (mmol/L)  Date Value  03/29/2024 140  05/16/2014 142   Corrected Ca: 9.5 mg/dL  Assessment  Bradley [name] is a 78 y.o. male presenting with severe sepsis and septic shock due to VRE bacteremia from infected wounds. PMH significant for paraplegia requiring chronic foley catheter and colostomy. Pharmacy has been consulted to monitor and replace electrolytes.  Diet: Regular diet  Pertinent medications: n/a  Goal of Therapy: Electrolytes WNL  Plan:  Phos-NaK 280-160-250mg  packet  x 2 (Each packet contains 250 mg elemental phosphorus.) Check renal function panel and Mg with AM labs  Thank you for allowing pharmacy to be a part of this patient's care.  Bernardino George, PharmD Candidate [phone] [facility] [employer] 03/29/2024 7:20 AM
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
## 2024-03-29 NOTE — Inpatient Diabetes Management (Signed)
 Inpatient Diabetes Program Recommendations  AACE/ADA: New Consensus Statement on Inpatient Glycemic Control (2015)  Target Ranges:  Prepandial:   less than 140 mg/dL      Peak postprandial:   less than 180 mg/dL (1-2 hours)      Critically ill patients:  140 - 180 mg/dL    Latest Reference Range & Units 03/28/24 07:48 03/28/24 16:34 03/28/24 22:19  Glucose-Capillary 70 - 99 mg/dL 96 80 86    Latest Reference Range & Units 03/29/24 07:33 03/29/24 08:02  Glucose-Capillary 70 - 99 mg/dL 62 (L) 62 (L)  (L): Data is abnormally low  Admit with: Paraplegia from waist down, chronic sacral wound who came to the hospital with malodor and increased bleeding from the sacral wound.   Current Orders: Novolog Moderate Correction Scale/ SSI (0-15 units) TID AC + HS    MD- Note Hypoglycemia this AM No History of Diabetes noted in H&P  Please d/c the Novolog SSI for now but Continue the CBG checks TID AC + HS     --Will follow patient during hospitalization--  Adina Rudolpho Arrow RN, MSN, CDCES Diabetes Coordinator Inpatient Glycemic Control Team Team Pager: [phone] (8a-5p)
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
## 2024-03-29 NOTE — Progress Notes (Signed)
 Patient's blood sugar was 62 and patient given orange juice to drink, follow up reading was 62, provider notified and ordered to administer dextrose  amp, follow up blood sugar was 116.

## 2024-03-29 NOTE — Plan of Care (Signed)
 Continuing with plan of care.
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
## 2024-03-29 NOTE — Progress Notes (Signed)
 Brief ID Pharmacist Note   Spoke with Dr. Fayette, based on culture susceptibilities, will narrow antibiotics to Unasyn .   [name] Quiet, PharmD, BCPS, BCIDP Infectious Diseases Clinical Pharmacist Phone: [phone] 03/29/2024 3:02 PM

## 2024-03-29 NOTE — Progress Notes (Signed)
 Date of Admission:  03/26/2024      ID: LAW CORSINO is a 78 y.o. male  Principal Problem:   Wound infection Active Problems:   Iron  deficiency anemia   Sacral wound   Paraplegia (HCC)   Severe sepsis (HCC)   HTN (hypertension)   Reactive thrombocytosis   Septic shock (HCC)   Bacteremia   Pressure injury of skin   Sepsis due to Enterococcus (HCC)   Sacral decubitus ulcer, stage IV (HCC) DEWARD SEBEK is a 78 y.o. with a history of T7 spinal cord injury with paraplegia, neurogenic bladder , chronic sacral decubitus S/p colostomy, neurogenic bladder, chronic Foley, left AKA Recent hospitalization at [facility] between 02/09/2024 until 02/21/2024 for infection of the chronic sacral wound and the culture which was positive for MRSA Acinetobacter and mixed organisms and was treated medically with meropenem  1 g 9/-5 9/13 and vancomycin  2 g 9/ 6-/02/2012 and sent on minocycline 200 mg.  And during that admission was thought that he was not a candidate for any type of surgery including flap surgery and so was managed medically. Came [facility] ED on 9/21 for a wound check because the wound had a malodor and was some bleeding. In the ED vitals BP of 84/75, temperature 99.3, pulse 128 and sats of 100%. WBC was 26.1, Hb 7.5, platelet 802 and creatinine of 0.91., Blood cultures were sent Patient was started on linezolid and Unasyn  which later was changed to Zosyn.  Admitted to ICU   Subjective: Patient is feeling okay   Medications:   vitamin C   500 mg Oral BID   Chlorhexidine  Gluconate Cloth  6 each Topical Daily   enoxaparin  (LOVENOX ) injection  40 mg Subcutaneous Q24H   feeding supplement  237 mL Oral TID BM   hydrocortisone sod succinate (SOLU-CORTEF) inj  100 mg Intravenous Q8H   midodrine  15 mg Oral TID WC   mirtazapine  7.5 mg Oral QHS   multivitamin with minerals  1 tablet Oral Daily   pantoprazole  (PROTONIX ) IV  40 mg Intravenous Q12H   sodium hypochlorite   Topical BID     Objective: Vital signs in last 24 hours: Patient Vitals for the past 24 hrs:  BP Temp Temp src Pulse Resp SpO2 Weight  03/29/24 1330 (!) 114/50 -- -- (!) 109 (!) 23 97 % --  03/29/24 1300 (!) 108/50 -- -- 90 (!) 33 98 % --  03/29/24 1230 (!) 112/52 -- -- (!) 105 (!) 36 98 % --  03/29/24 1200 (!) 109/55 98.2 F (36.8 C) Oral (!) 108 (!) 38 98 % --  03/29/24 1130 (!) 117/59 -- -- (!) 104 (!) 26 98 % --  03/29/24 1100 (!) 115/54 -- -- (!) 109 (!) 33 99 % --  03/29/24 1030 (!) 110/57 -- -- (!) 118 (!) 27 95 % --  03/29/24 1000 (!) 169/79 -- -- 96 (!) 22 99 % --  03/29/24 0930 112/65 -- -- 87 (!) 33 98 % --  03/29/24 0900 -- -- -- 99 (!) 33 99 % --  03/29/24 0830 95/76 -- -- (!) 103 (!) 29 100 % --  03/29/24 0800 (!) 98/48 98.4 F (36.9 C) Oral (!) 104 (!) 36 97 % --  03/29/24 0730 (!) 102/48 -- -- 99 (!) 35 97 % --  03/29/24 0700 (!) 102/53 -- -- 94 (!) 34 97 % --  03/29/24 0645 (!) 114/47 -- -- 96 (!) 29 98 % --  03/29/24 0630 107/60 -- --  98 (!) 31 97 % --  03/29/24 0615 (!) 110/55 -- -- 95 (!) 33 99 % --  03/29/24 0600 (!) 114/59 -- -- 86 (!) 27 98 % --  03/29/24 0545 112/60 -- -- 91 (!) 32 98 % --  03/29/24 0530 (!) 111/54 -- -- 81 (!) 27 100 % --  03/29/24 0515 109/61 -- -- 81 (!) 24 100 % --  03/29/24 0500 (!) 116/50 -- -- 88 (!) 33 100 % 79.7 kg  03/29/24 0445 (!) 112/58 -- -- 76 (!) 24 100 % --  03/29/24 0430 (!) 119/54 -- -- 75 (!) 28 100 % --  03/29/24 0415 (!) 119/58 -- -- 77 (!) 29 100 % --  03/29/24 0400 102/60 98.4 F (36.9 C) Oral 85 (!) 32 97 % --  03/29/24 0345 (!) 106/48 -- -- 80 (!) 30 100 % --  03/29/24 0330 (!) 110/43 -- -- 89 (!) 24 100 % --  03/29/24 0315 (!) 117/47 -- -- 72 (!) 31 99 % --  03/29/24 0300 (!) 115/51 -- -- 74 (!) 27 97 % --  03/29/24 0245 (!) 102/47 -- -- 63 (!) 31 100 % --  03/29/24 0230 (!) 104/52 -- -- 74 (!) 28 100 % --  03/29/24 0215 (!) 107/50 -- -- 69 (!) 27 100 % --  03/29/24 0200 (!) 113/45 -- -- 66 (!) 30 98 % --  03/29/24  0145 -- -- -- 68 (!) 26 100 % --  03/29/24 0130 132/65 -- -- 65 (!) 29 100 % --  03/29/24 0115 (!) 100/56 -- -- 68 (!) 30 98 % --  03/29/24 0100 (!) 110/35 -- -- 65 (!) 26 98 % --  03/29/24 0045 (!) 106/35 -- -- 71 (!) 28 99 % --  03/29/24 0030 (!) 123/47 -- -- 71 (!) 30 99 % --  03/29/24 0015 (!) 110/50 -- -- 74 (!) 27 98 % --  03/29/24 0000 (!) 117/48 98.3 F (36.8 C) Oral 73 (!) 27 99 % --  03/28/24 2345 (!) 113/47 -- -- 70 (!) 25 98 % --  03/28/24 2330 -- -- -- 79 (!) 30 98 % --  03/28/24 2315 (!) 76/57 -- -- 73 (!) 27 100 % --  03/28/24 2300 (!) 106/56 -- -- 84 (!) 30 99 % --  03/28/24 2245 102/68 -- -- 78 (!) 28 98 % --  03/28/24 2230 (!) 114/47 -- -- 72 (!) 26 98 % --  03/28/24 2215 (!) 119/47 -- -- 79 (!) 21 97 % --  03/28/24 2200 -- -- -- 91 (!) 32 98 % --  03/28/24 2145 -- -- -- 75 (!) 30 98 % --  03/28/24 2100 (!) 115/41 -- -- 83 (!) 33 97 % --  03/28/24 2045 (!) 108/44 -- -- 83 (!) 34 97 % --  03/28/24 2030 (!) 105/45 -- -- 80 (!) 35 98 % --  03/28/24 2015 (!) 102/44 -- -- 83 (!) 22 98 % --  03/28/24 2000 (!) 120/52 98.4 F (36.9 C) Oral 80 (!) 27 96 % --  03/28/24 1945 (!) 103/58 -- -- 97 (!) 28 96 % --  03/28/24 1930 -- -- -- 85 (!) 24 99 % --  03/28/24 1915 -- -- -- 84 (!) 21 97 % --  03/28/24 1900 -- -- -- 81 (!) 22 100 % --  03/28/24 1859 (!) 124/51 -- -- 77 (!) 47 100 % --  03/28/24 1845 (!) 106/37 -- -- 78 18 100 % --  03/28/24 1830 (!) 118/55 -- -- 77 20 100 % --  03/28/24 1815 (!) 106/41 -- -- 77 (!) 21 100 % --  03/28/24 1800 (!) 98/44 -- -- 80 (!) 23 97 % --  03/28/24 1745 103/60 -- -- 91 (!) 21 98 % --  03/28/24 1730 (!) 104/48 -- -- 79 (!) 26 100 % --  03/28/24 1715 (!) 91/50 -- -- 89 (!) 21 100 % --  03/28/24 1700 (!) 115/52 -- -- 80 (!) 27 99 % --  03/28/24 1645 (!) 117/49 -- -- 80 (!) 30 99 % --  03/28/24 1630 (!) 116/53 -- -- 72 (!) 31 100 % --  03/28/24 1615 (!) 119/55 -- -- 75 (!) 33 97 % --  03/28/24 1600 (!) 122/53 -- -- 73 (!) 32 99 % --   03/28/24 1545 (!) 110/44 -- -- 69 (!) 32 99 % --  03/28/24 1530 (!) 128/56 -- -- 72 (!) 26 97 % --  03/28/24 1515 122/66 -- -- 70 (!) 31 98 % --  03/28/24 1500 (!) 119/47 -- -- 70 (!) 29 99 % --  03/28/24 1445 (!) 128/51 -- -- 72 (!) 32 98 % --  03/28/24 1430 (!) 129/54 -- -- 75 (!) 39 100 % --       PHYSICAL EXAM:  General: Alert, cooperative, no distress, appears young for age . Lungs: Bilateral air entry Heart: Regular rate and rhythm, no murmur, rub or gallop. Abdomen: Soft, non-tender,not distended. Bowel sounds normal. No masses Back extensive necrotic deep wounds       Skin: No rashes or lesions. Or bruising Lymph: Cervical, supraclavicular normal. Neurologic: Paraplegia Left AKA Colostomy  Lab Results    Latest Ref Rng & Units 03/29/2024    5:09 AM 03/28/2024    3:13 AM 03/27/2024    5:29 PM  CBC  WBC 4.0 - 10.5 K/uL 17.2  17.3    Hemoglobin 13.0 - 17.0 g/dL 9.3  8.4  8.2   Hematocrit 39.0 - 52.0 % 31.5  27.1  26.6   Platelets 150 - 400 K/uL 680  679         Latest Ref Rng & Units 03/29/2024    5:09 AM 03/28/2024    3:13 AM 03/27/2024    8:52 PM  CMP  Glucose 70 - 99 mg/dL 63  881    BUN 8 - 23 mg/dL 15  20    Creatinine 9.38 - 1.24 mg/dL 9.53  9.55    Sodium 864 - 145 mmol/L 140  136    Potassium 3.5 - 5.1 mmol/L 4.6  3.1  3.0   Chloride 98 - 111 mmol/L 102  101    CO2 22 - 32 mmol/L 26  24    Calcium 8.9 - 10.3 mg/dL 9.3  8.6        Microbiology: 1 set of blood culture has Enterococcus and Proteus mirabilis, clostridium ramosum From 03/26/2024. Repeat blood culture has been sent on 03/28/2024 I   Assessment/Plan: Enterococcus faecalis bacteremia and Proteus Mirabella's bacteremia clostridium bacteremia secondary to severely infected necrotic sacral decubitus Patient is currently on linezolid and meropenem  - will change to unasyn  to colver all the three bacteria  Severe sepsis from stage IV sacral wounds the sacral wounds are necrotic with  an element of necrotizing fasciitis and underlying osteomyelitis.  This is chronic because of his paraplegia but worse recently He was at [facility] in September 2025 and surgery was deemed not possible.  Also antibiotics  were futile in this situation except for the acute septic picture We had a case conference the patient and the nephew It was decided that since he is a high risk surgical candidate we will treat him with antibiotics for the sepsis and bacteremia.  But not long-term antibiotics.  Leukocytosis secondary to infection is improving  Hyperbilirubinemia secondary to sepsis  Hypoalbuminemia  Anemia  Paraplegia  Colostomy  Neurogenic bladder with chronic Foley  Left AKA  Discussed the management with the patient and care team Will follow him peripherally this weekend

## 2024-03-30 DIAGNOSIS — L089 Local infection of the skin and subcutaneous tissue, unspecified: Secondary | ICD-10-CM | POA: Diagnosis not present

## 2024-03-30 DIAGNOSIS — A419 Sepsis, unspecified organism: Secondary | ICD-10-CM | POA: Diagnosis not present

## 2024-03-30 DIAGNOSIS — R6521 Severe sepsis with septic shock: Secondary | ICD-10-CM | POA: Diagnosis not present

## 2024-03-30 DIAGNOSIS — T148XXA Other injury of unspecified body region, initial encounter: Secondary | ICD-10-CM | POA: Diagnosis not present

## 2024-03-30 LAB — RENAL FUNCTION PANEL
Albumin: 2 g/dL — ABNORMAL LOW (ref 3.5–5.0)
Anion gap: 10 (ref 5–15)
BUN: 18 mg/dL (ref 8–23)
CO2: 25 mmol/L (ref 22–32)
Calcium: 9.2 mg/dL (ref 8.9–10.3)
Chloride: 102 mmol/L (ref 98–111)
Creatinine, Ser: 0.48 mg/dL — ABNORMAL LOW (ref 0.61–1.24)
GFR, Estimated: >60 mL/min (ref >60)
Glucose, Bld: 139 mg/dL — ABNORMAL HIGH (ref 70–99)
Phosphorus: 3 mg/dL (ref 2.5–4.6)
Potassium: 4.3 mmol/L (ref 3.5–5.1)
Sodium: 137 mmol/L (ref 135–145)

## 2024-03-30 LAB — CBC
HCT: 28.8 % — ABNORMAL LOW (ref 39.0–52.0)
Hemoglobin: 8.6 g/dL — ABNORMAL LOW (ref 13.0–17.0)
MCH: 22.9 pg — ABNORMAL LOW (ref 26.0–34.0)
MCHC: 29.9 g/dL — ABNORMAL LOW (ref 30.0–36.0)
MCV: 76.6 fL — ABNORMAL LOW (ref 80.0–100.0)
Platelets: 631 K/uL — ABNORMAL HIGH (ref 150–400)
RBC: 3.76 MIL/uL — ABNORMAL LOW (ref 4.22–5.81)
RDW: 25.7 % — ABNORMAL HIGH (ref 11.5–15.5)
WBC: 15.2 K/uL — ABNORMAL HIGH (ref 4.0–10.5)
nRBC: 0 % (ref 0.0–0.2)

## 2024-03-30 LAB — GLUCOSE, CAPILLARY
Glucose-Capillary: 146 mg/dL — ABNORMAL HIGH (ref 70–99)
Glucose-Capillary: 110 mg/dL — ABNORMAL HIGH (ref 70–99)
Glucose-Capillary: 167 mg/dL — ABNORMAL HIGH (ref 70–99)

## 2024-03-30 MED ORDER — GUAIFENESIN 100 MG/5ML PO LIQD
200.0000 mg | ORAL | Status: DC | PRN
  Administered 2024-03-30 – 2024-03-31 (×2): 200 mg via ORAL
  Filled 2024-03-30 (×2): qty 10
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
## 2024-03-30 NOTE — Plan of Care (Signed)
 Continuing with plan of care.

## 2024-03-30 NOTE — Progress Notes (Signed)
 PHARMACY CONSULT NOTE - ELECTROLYTES  Pharmacy Consult for Electrolyte Monitoring and Replacement   Recent Labs: Height: 6' 2 (188 cm) Weight: 81.4 kg (179 lb 7.3 oz) IBW/kg (Calculated) : 82.2 Estimated Creatinine Clearance: 84.8 mL/min (A) (by C-G formula based on SCr of 0.48 mg/dL (L)). Potassium (mmol/L)  Date Value  03/30/2024 4.3  05/16/2014 3.1 (L)   Magnesium (mg/dL)  Date Value  89/75/7974 2.3  06/17/2011 2.3   Calcium (mg/dL)  Date Value  89/74/7974 9.2   Calcium, Total (mg/dL)  Date Value  87/88/7984 7.9 (L)   Albumin (g/dL)  Date Value  89/74/7974 2.0 (L)  05/13/2014 2.5 (L)   Phosphorus (mg/dL)  Date Value  89/74/7974 3.0   Sodium (mmol/L)  Date Value  03/30/2024 137  05/16/2014 142   Corrected Ca: 9.5 mg/dL  Assessment  Bradley [name] is a 78 y.o. male presenting with severe sepsis and septic shock due to VRE bacteremia from infected wounds. PMH significant for paraplegia requiring chronic foley catheter and colostomy. Pharmacy has been consulted to monitor and replace electrolytes.  Diet: Regular diet  Pertinent medications: n/a  Goal of Therapy: Electrolytes WNL  Plan:  No replacement indicated Check renal function panel and Mg with AM labs  Thank you for allowing pharmacy to be a part of this patient's care.  Lindie Roberson A Neya Creegan, PharmD Clinical Pharmacist 03/30/2024 7:50 AM

## 2024-03-31 DIAGNOSIS — R6521 Severe sepsis with septic shock: Secondary | ICD-10-CM | POA: Diagnosis not present

## 2024-03-31 DIAGNOSIS — T148XXA Other injury of unspecified body region, initial encounter: Secondary | ICD-10-CM | POA: Diagnosis not present

## 2024-03-31 DIAGNOSIS — L089 Local infection of the skin and subcutaneous tissue, unspecified: Secondary | ICD-10-CM | POA: Diagnosis not present

## 2024-03-31 DIAGNOSIS — A419 Sepsis, unspecified organism: Secondary | ICD-10-CM | POA: Diagnosis not present

## 2024-03-31 LAB — CBC
HCT: 26.7 % — ABNORMAL LOW (ref 39.0–52.0)
Hemoglobin: 8 g/dL — ABNORMAL LOW (ref 13.0–17.0)
MCH: 23 pg — ABNORMAL LOW (ref 26.0–34.0)
MCHC: 30 g/dL (ref 30.0–36.0)
MCV: 76.7 fL — ABNORMAL LOW (ref 80.0–100.0)
Platelets: 574 K/uL — ABNORMAL HIGH (ref 150–400)
RBC: 3.48 MIL/uL — ABNORMAL LOW (ref 4.22–5.81)
RDW: 26.9 % — ABNORMAL HIGH (ref 11.5–15.5)
WBC: 12.6 K/uL — ABNORMAL HIGH (ref 4.0–10.5)
nRBC: 0 % (ref 0.0–0.2)

## 2024-03-31 LAB — CULTURE, BLOOD (ROUTINE X 2): Culture: NO GROWTH

## 2024-03-31 LAB — RENAL FUNCTION PANEL
Albumin: 1.9 g/dL — ABNORMAL LOW (ref 3.5–5.0)
Anion gap: 8 (ref 5–15)
BUN: 20 mg/dL (ref 8–23)
CO2: 27 mmol/L (ref 22–32)
Calcium: 8.3 mg/dL — ABNORMAL LOW (ref 8.9–10.3)
Chloride: 104 mmol/L (ref 98–111)
Creatinine, Ser: 0.5 mg/dL — ABNORMAL LOW (ref 0.61–1.24)
GFR, Estimated: >60 mL/min (ref >60)
Glucose, Bld: 168 mg/dL — ABNORMAL HIGH (ref 70–99)
Phosphorus: 3.1 mg/dL (ref 2.5–4.6)
Potassium: 3.7 mmol/L (ref 3.5–5.1)
Sodium: 139 mmol/L (ref 135–145)

## 2024-03-31 LAB — MAGNESIUM: Magnesium: 2 mg/dL (ref 1.7–2.4)

## 2024-03-31 LAB — HEMOGLOBIN AND HEMATOCRIT, BLOOD
HCT: 26.7 % — ABNORMAL LOW (ref 39.0–52.0)
Hemoglobin: 8.3 g/dL — ABNORMAL LOW (ref 13.0–17.0)

## 2024-03-31 LAB — GLUCOSE, CAPILLARY: Glucose-Capillary: 114 mg/dL — ABNORMAL HIGH (ref 70–99)

## 2024-03-31 MED ORDER — NOREPINEPHRINE 4 MG/250ML-% IV SOLN
INTRAVENOUS | Status: AC
  Filled 2024-03-31: qty 250

## 2024-03-31 NOTE — Plan of Care (Signed)
  Problem: Clinical Measurements: Goal: Ability to maintain clinical measurements within normal limits will improve Outcome: Progressing Goal: Will remain free from infection Outcome: Progressing Goal: Diagnostic test results will improve Outcome: Progressing Goal: Respiratory complications will improve Outcome: Progressing Goal: Cardiovascular complication will be avoided Outcome: Progressing   Problem: Health Behavior/Discharge Planning: Goal: Ability to manage health-related needs will improve Outcome: Progressing   Problem: Education: Goal: Knowledge of General Education information will improve Description: Including pain rating scale, medication(s)/side effects and non-pharmacologic comfort measures Outcome: Progressing   

## 2024-03-31 NOTE — Plan of Care (Signed)
  Problem: Education: Goal: Knowledge of General Education information will improve Description: Including pain rating scale, medication(s)/side effects and non-pharmacologic comfort measures Outcome: Progressing   Problem: Clinical Measurements: Goal: Ability to maintain clinical measurements within normal limits will improve Outcome: Progressing Goal: Will remain free from infection Outcome: Progressing Goal: Diagnostic test results will improve Outcome: Progressing Goal: Respiratory complications will improve Outcome: Progressing Goal: Cardiovascular complication will be avoided Outcome: Progressing   Problem: Nutrition: Goal: Adequate nutrition will be maintained Outcome: Progressing   Problem: Safety: Goal: Ability to remain free from injury will improve Outcome: Progressing   

## 2024-03-31 NOTE — Plan of Care (Signed)
 Continuing with plan of care.
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
## 2024-03-31 NOTE — Plan of Care (Signed)
 Continue IV abx and stress dose steroids with Midodrine Follow up ID recs Transfer to PCU and Transfer to [facility] ICU needs resolved

## 2024-03-31 NOTE — Progress Notes (Signed)
 Attempted to call report to nurse that will be receiving patient.

## 2024-03-31 NOTE — Progress Notes (Signed)
 Order received from provider to discontinue CBGs.

## 2024-04-01 DIAGNOSIS — D638 Anemia in other chronic diseases classified elsewhere: Secondary | ICD-10-CM | POA: Insufficient documentation

## 2024-04-01 DIAGNOSIS — B952 Enterococcus as the cause of diseases classified elsewhere: Secondary | ICD-10-CM | POA: Diagnosis not present

## 2024-04-01 DIAGNOSIS — D75838 Other thrombocytosis: Secondary | ICD-10-CM

## 2024-04-01 DIAGNOSIS — M726 Necrotizing fasciitis: Secondary | ICD-10-CM | POA: Diagnosis not present

## 2024-04-01 DIAGNOSIS — R7881 Bacteremia: Secondary | ICD-10-CM | POA: Diagnosis not present

## 2024-04-01 DIAGNOSIS — G8220 Paraplegia, unspecified: Secondary | ICD-10-CM | POA: Diagnosis not present

## 2024-04-01 DIAGNOSIS — A4181 Sepsis due to Enterococcus: Secondary | ICD-10-CM

## 2024-04-01 DIAGNOSIS — A419 Sepsis, unspecified organism: Secondary | ICD-10-CM | POA: Diagnosis not present

## 2024-04-01 DIAGNOSIS — R6521 Severe sepsis with septic shock: Secondary | ICD-10-CM

## 2024-04-01 DIAGNOSIS — L89150 Pressure ulcer of sacral region, unstageable: Secondary | ICD-10-CM

## 2024-04-01 LAB — RENAL FUNCTION PANEL
Albumin: 1.9 g/dL — ABNORMAL LOW (ref 3.5–5.0)
Anion gap: 11 (ref 5–15)
BUN: 19 mg/dL (ref 8–23)
CO2: 25 mmol/L (ref 22–32)
Calcium: 7.9 mg/dL — ABNORMAL LOW (ref 8.9–10.3)
Chloride: 103 mmol/L (ref 98–111)
Creatinine, Ser: 0.49 mg/dL — ABNORMAL LOW (ref 0.61–1.24)
GFR, Estimated: >60 mL/min (ref >60)
Glucose, Bld: 153 mg/dL — ABNORMAL HIGH (ref 70–99)
Phosphorus: 2.7 mg/dL (ref 2.5–4.6)
Potassium: 3.3 mmol/L — ABNORMAL LOW (ref 3.5–5.1)
Sodium: 139 mmol/L (ref 135–145)

## 2024-04-01 LAB — CBC
HCT: 25.5 % — ABNORMAL LOW (ref 39.0–52.0)
Hemoglobin: 7.6 g/dL — ABNORMAL LOW (ref 13.0–17.0)
MCH: 23.1 pg — ABNORMAL LOW (ref 26.0–34.0)
MCHC: 29.8 g/dL — ABNORMAL LOW (ref 30.0–36.0)
MCV: 77.5 fL — ABNORMAL LOW (ref 80.0–100.0)
Platelets: 569 K/uL — ABNORMAL HIGH (ref 150–400)
RBC: 3.29 MIL/uL — ABNORMAL LOW (ref 4.22–5.81)
RDW: 27.3 % — ABNORMAL HIGH (ref 11.5–15.5)
WBC: 10.7 K/uL — ABNORMAL HIGH (ref 4.0–10.5)
nRBC: 0 % (ref 0.0–0.2)

## 2024-04-01 MED ORDER — ORAL CARE MOUTH RINSE: 15.0000 mL | OROMUCOSAL | Status: DC | PRN

## 2024-04-01 MED ORDER — DAKINS (1/4 STRENGTH) 0.125 % EX SOLN
Freq: Two times a day (BID) | CUTANEOUS | Status: AC
  Filled 2024-04-01 (×2): qty 473
  Filled 2024-04-01: qty 1

## 2024-04-01 MED ORDER — HYDROCORTISONE SOD SUC (PF) 100 MG IJ SOLR
50.0000 mg | Freq: Three times a day (TID) | INTRAMUSCULAR | Status: DC
  Administered 2024-04-01 – 2024-04-02 (×4): 50 mg via INTRAVENOUS
  Filled 2024-04-01 (×5): qty 1

## 2024-04-01 MED ORDER — POTASSIUM CHLORIDE CRYS ER 20 MEQ PO TBCR
40.0000 meq | EXTENDED_RELEASE_TABLET | Freq: Once | ORAL | Status: AC
  Administered 2024-04-01: 40 meq via ORAL
  Filled 2024-04-01: qty 2

## 2024-04-01 NOTE — Progress Notes (Signed)
 Date of Admission:  03/26/2024      ID: Bradley [name] is a 78 y.o. male  Principal Problem:   Septic shock (HCC) Active Problems:   Iron  deficiency anemia   Sacral wound   Paraplegia (HCC)   HTN (hypertension)   Reactive thrombocytosis   Wound infection   Bacteremia   Pressure injury of skin   Sepsis due to Enterococcus (HCC)   Anemia of chronic disease Bradley [name] is a 78 y.o. with a history of T7 spinal cord injury with paraplegia, neurogenic bladder , chronic sacral decubitus S/p colostomy, neurogenic bladder, chronic Foley, left AKA Recent hospitalization at [facility] between 02/09/2024 until 02/21/2024 for infection of the chronic sacral wound and the culture which was positive for MRSA Acinetobacter and mixed organisms and was treated medically with meropenem  1 g 9/-5 9/13 and vancomycin  2 g 9/ 6-/02/2012 and sent on minocycline 200 mg.  And during that admission was thought that he was not a candidate for any type of surgery including flap surgery and so was managed medically. Came To [facility] ED on 9/21 for a wound check because the wound had a malodor and was some bleeding. In the ED vitals BP of 84/75, temperature 99.3, pulse 128 and sats of 100%. WBC was 26.1, Hb 7.5, platelet 802 and creatinine of 0.91., Blood cultures were sent Patient was started on linezolid and Unasyn  which later was changed to Zosyn.  Admitted to ICU  Antibioitc Linezolid and unasyn  Zosyn Linezolid /Meropenem  Now on Unasyn  since 03/29/24  Subjective: Patient is feeling okay   Medications:   vitamin C   500 mg Oral BID   Chlorhexidine  Gluconate Cloth  6 each Topical Daily   enoxaparin  (LOVENOX ) injection  40 mg Subcutaneous Q24H   feeding supplement  237 mL Oral TID BM   hydrocortisone sod succinate (SOLU-CORTEF) inj  50 mg Intravenous Q8H   midodrine  15 mg Oral TID WC   mirtazapine  7.5 mg Oral QHS   multivitamin with minerals  1 tablet Oral Daily   pantoprazole   40 mg Oral BID   sodium  hypochlorite   Topical BID    Objective: Vital signs in last 24 hours: Patient Vitals for the past 24 hrs:  BP Temp Temp src Pulse Resp SpO2 Weight  04/01/24 1739 (!) 135/53 98 F (36.7 C) -- (!) 50 -- 100 % --  04/01/24 1142 (!) 140/63 98 F (36.7 C) Oral (!) 56 19 100 % --  04/01/24 0734 (!) 147/52 97.6 F (36.4 C) -- 60 18 100 % --  04/01/24 0501 -- -- -- -- -- -- 84 kg  04/01/24 0358 (!) 116/50 97.6 F (36.4 C) Axillary 60 18 99 % --  03/31/24 2345 (!) 123/53 97.8 F (36.6 C) Axillary (!) 59 20 100 % --  03/31/24 2041 (!) 127/55 97.9 F (36.6 C) Oral (!) 56 18 99 % --  03/31/24 1836 (!) 126/49 97.7 F (36.5 C) Oral (!) 51 18 100 % --       PHYSICAL EXAM:  General: Alert, cooperative, no distress, appears young for age . Lungs: Bilateral air entry Heart: Regular rate and rhythm, no murmur, rub or gallop. Abdomen: Soft, non-tender,not distended. Bowel sounds normal. No masses Back  extensive necrotic deep wounds foul discharge         Lymph: Cervical, supraclavicular normal. Neurologic: Paraplegia Left AKA Colostomy Foley catheter Lab Results    Latest Ref Rng & Units 04/01/2024    4:06 AM 03/31/2024  4:49 AM 03/30/2024    5:03 AM  CBC  WBC 4.0 - 10.5 K/uL 10.7  12.6  15.2   Hemoglobin 13.0 - 17.0 g/dL 7.6  8.0  8.6   Hematocrit 39.0 - 52.0 % 25.5  26.7  28.8   Platelets 150 - 400 K/uL 569  574  631        Latest Ref Rng & Units 04/01/2024    4:06 AM 03/31/2024    4:49 AM 03/30/2024    5:03 AM  CMP  Glucose 70 - 99 mg/dL 846  831  860   BUN 8 - 23 mg/dL 19  20  18    Creatinine 0.61 - 1.24 mg/dL 9.50  9.49  9.51   Sodium 135 - 145 mmol/L 139  139  137   Potassium 3.5 - 5.1 mmol/L 3.3  3.7  4.3   Chloride 98 - 111 mmol/L 103  104  102   CO2 22 - 32 mmol/L 25  27  25    Calcium 8.9 - 10.3 mg/dL 7.9  8.3  9.2       Microbiology: 1 set of blood culture has Enterococcus and Proteus mirabilis, clostridium ramosum From 03/26/2024. Repeat blood  culture  on 03/28/2024 NG  Assessment/Plan: Enterococcus faecalis bacteremia and Proteus and  clostridium bacteremia secondary to severely infected necrotic sacral decubitus Patient is on unasyn  - continue IV for a total of 2 weeks 04/09/24 Followed by PO augmentin  for 2 more weeks  until 04/23/24 Severe sepsis from stage IV sacral wounds the sacral wounds are necrotic with an element of necrotizing fasciitis and underlying osteomyelitis.  This is chronic because of his paraplegia but worse recently He was at [facility] in September 2025 and surgery was deemed not possible.  Also antibiotics were futile in this situation except for the acute septic picture We had a case conference with the patient and the nephew It was decided that since he is a high risk surgical candidate we will treat him with antibiotics for the sepsis and bacteremia.  But not long-term antibiotics.  Leukocytosis secondary to infection is improving  Hyperbilirubinemia secondary to sepsis  Hypoalbuminemia  Anemia  Paraplegia  Colostomy  Neurogenic bladder with chronic Foley  Left AKA  Discussed the management with the patient and care team

## 2024-04-01 NOTE — Assessment & Plan Note (Signed)
 Bradley [name]

## 2024-04-01 NOTE — Assessment & Plan Note (Signed)
 Secondary to infection

## 2024-04-01 NOTE — Assessment & Plan Note (Addendum)
 Present on admission.  Secondary to necrotic stage IV sacral decubiti.  3 organisms growing out of blood culture.  On Unasyn  which would cover all 3.  Will set up treatment plan with infectious disease doctor.  Taper stress dose steroids.  On midodrine.
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
## 2024-04-01 NOTE — TOC Progression Note (Signed)
 Transition of Care [facility]) - Progression Note    Patient Details  Name: Bradley [name] MRN: [id number] Date of Birth: [birth year]  [facility]) CM/SW Contact  [name], [name] Phone Number: 04/01/2024, 2:23 PM  Clinical Narrative:    Chart reviewed. I put in a referral for LTACH with [facility]. [name] from [facility] is able to give the patient a bed offer. Will follow up with the patient about choice of LTACH or SNF.   There are no other toc needs at this time.      Barriers to Discharge: Continued Medical Work up               Expected Discharge Plan and Services       Living arrangements for the past 2 months: Single Family Home                                       Social Drivers of Health (SDOH) Interventions SDOH Screenings   Food Insecurity: No Food Insecurity (03/26/2024)  Housing: Low Risk  (03/26/2024)  Transportation Needs: No Transportation Needs (03/26/2024)  Utilities: Not At Risk (03/26/2024)  Depression (PHQ2-9): Low Risk  (03/02/2023)  Financial Resource Strain: Low Risk  (02/13/2024)   Received from [facility]  Social Connections: Unknown (03/26/2024)  Tobacco Use: Medium Risk (03/26/2024)    Readmission Risk Interventions    03/27/2024    3:44 PM  Readmission Risk Prevention Plan  Transportation Screening Complete  PCP or Specialist Appt within 3-5 Days Complete  HRI or Home Care Consult Complete  Social Work Consult for Recovery Care Planning/Counseling Complete  Palliative Care Screening Not Applicable  Medication Review Oceanographer) Complete

## 2024-04-01 NOTE — Assessment & Plan Note (Signed)
 Wound 03/26/24 1850 Pressure Injury Penis Medial Unstageable - Full thickness tissue loss in which the base of the injury is covered by slough (yellow, tan, gray, green or brown) and/or eschar (tan, brown or black) in the wound bed. (Active)     Wound 03/26/24 1851 Pressure Injury Heel Right Unstageable - Full thickness tissue loss in which the base of the injury is covered by slough (yellow, tan, gray, green or brown) and/or eschar (tan, brown or black) in the wound bed. (Active)     Wound 03/26/24 1852 Pressure Injury Buttocks Mid;Upper;Lower;Medial;Circumferential Unstageable - Full thickness tissue loss in which the base of the injury is covered by slough (yellow, tan, gray, green or brown) and/or eschar (tan, brown or black) in t (Active)   Present on admission

## 2024-04-01 NOTE — Assessment & Plan Note (Addendum)
 Last hemoglobin 7.9.  Continue to monitor closely for need for transfusion.

## 2024-04-01 NOTE — Plan of Care (Signed)
  Problem: Education: Goal: Knowledge of General Education information will improve Description: Including pain rating scale, medication(s)/side effects and non-pharmacologic comfort measures Outcome: Progressing   Problem: Health Behavior/Discharge Planning: Goal: Ability to manage health-related needs will improve Outcome: Progressing   Problem: Clinical Measurements: Goal: Ability to maintain clinical measurements within normal limits will improve Outcome: Progressing Goal: Will remain free from infection Outcome: Progressing Goal: Diagnostic test results will improve Outcome: Progressing Goal: Respiratory complications will improve Outcome: Progressing Goal: Cardiovascular complication will be avoided Outcome: Progressing   Problem: Activity: Goal: Risk for activity intolerance will decrease Outcome: Progressing   Problem: Nutrition: Goal: Adequate nutrition will be maintained Outcome: Progressing   Problem: Coping: Goal: Level of anxiety will decrease Outcome: Progressing   Problem: Elimination: Goal: Will not experience complications related to bowel motility Outcome: Progressing Goal: Will not experience complications related to urinary retention Outcome: Progressing   Problem: Pain Managment: Goal: General experience of comfort will improve and/or be controlled Outcome: Progressing   Problem: Safety: Goal: Ability to remain free from injury will improve Outcome: Progressing   Problem: Skin Integrity: Goal: Risk for impaired skin integrity will decrease Outcome: Progressing   Problem: Clinical Measurements: Goal: Ability to avoid or minimize complications of infection will improve Outcome: Progressing   Problem: Skin Integrity: Goal: Skin integrity will improve Outcome: Progressing   Problem: Education: Goal: Ability to describe self-care measures that may prevent or decrease complications (Diabetes Survival Skills Education) will improve Outcome:  Progressing Goal: Individualized Educational Video(s) Outcome: Progressing   Problem: Coping: Goal: Ability to adjust to condition or change in health will improve Outcome: Progressing   Problem: Fluid Volume: Goal: Ability to maintain a balanced intake and output will improve Outcome: Progressing   Problem: Health Behavior/Discharge Planning: Goal: Ability to identify and utilize available resources and services will improve Outcome: Progressing Goal: Ability to manage health-related needs will improve Outcome: Progressing   Problem: Metabolic: Goal: Ability to maintain appropriate glucose levels will improve Outcome: Progressing   Problem: Nutritional: Goal: Maintenance of adequate nutrition will improve Outcome: Progressing Goal: Progress toward achieving an optimal weight will improve Outcome: Progressing   Problem: Skin Integrity: Goal: Risk for impaired skin integrity will decrease Outcome: Progressing   Problem: Tissue Perfusion: Goal: Adequacy of tissue perfusion will improve Outcome: Progressing

## 2024-04-01 NOTE — TOC Progression Note (Signed)
 Transition of Care [facility]) - Progression Note    Patient Details  Name: Bradley [name] MRN: [id number] Date of Birth: 11-28-1945  [facility]) CM/SW Contact  [name], [name] Phone Number: 04/01/2024, 11:38 AM  Clinical Narrative:   Per TOC coworker, [facility] can offer a bed. CSW asked [facility] to review. They can offer a bed as well. CSW gave offers to patient for review.    Barriers to Discharge: Continued Medical Work up               Expected Discharge Plan and Services       Living arrangements for the past 2 months: Single Family Home                                       Social Drivers of Health (SDOH) Interventions SDOH Screenings   Food Insecurity: No Food Insecurity (03/26/2024)  Housing: Low Risk  (03/26/2024)  Transportation Needs: No Transportation Needs (03/26/2024)  Utilities: Not At Risk (03/26/2024)  Depression (PHQ2-9): Low Risk  (03/02/2023)  Financial Resource Strain: Low Risk  (02/13/2024)   Received from [facility]  Social Connections: Unknown (03/26/2024)  Tobacco Use: Medium Risk (03/26/2024)    Readmission Risk Interventions    03/27/2024    3:44 PM  Readmission Risk Prevention Plan  Transportation Screening Complete  PCP or Specialist Appt within 3-5 Days Complete  HRI or Home Care Consult Complete  Social Work Consult for Recovery Care Planning/Counseling Complete  Palliative Care Screening Not Applicable  Medication Review Oceanographer) Complete
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
## 2024-04-01 NOTE — Plan of Care (Signed)
  Problem: Education: Goal: Knowledge of General Education information will improve Description: Including pain rating scale, medication(s)/side effects and non-pharmacologic comfort measures Outcome: Progressing   Problem: Health Behavior/Discharge Planning: Goal: Ability to manage health-related needs will improve Outcome: Progressing   Problem: Clinical Measurements: Goal: Ability to maintain clinical measurements within normal limits will improve Outcome: Progressing Goal: Cardiovascular complication will be avoided Outcome: Progressing   Problem: Nutrition: Goal: Adequate nutrition will be maintained Outcome: Progressing   Problem: Coping: Goal: Level of anxiety will decrease Outcome: Progressing   Problem: Elimination: Goal: Will not experience complications related to bowel motility Outcome: Progressing Goal: Will not experience complications related to urinary retention Outcome: Progressing   Problem: Pain Managment: Goal: General experience of comfort will improve and/or be controlled Outcome: Progressing
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
## 2024-04-02 DIAGNOSIS — L89159 Pressure ulcer of sacral region, unspecified stage: Secondary | ICD-10-CM | POA: Diagnosis not present

## 2024-04-02 DIAGNOSIS — L89154 Pressure ulcer of sacral region, stage 4: Secondary | ICD-10-CM | POA: Diagnosis not present

## 2024-04-02 DIAGNOSIS — E876 Hypokalemia: Secondary | ICD-10-CM | POA: Diagnosis not present

## 2024-04-02 DIAGNOSIS — D638 Anemia in other chronic diseases classified elsewhere: Secondary | ICD-10-CM | POA: Diagnosis not present

## 2024-04-02 DIAGNOSIS — A419 Sepsis, unspecified organism: Secondary | ICD-10-CM | POA: Diagnosis not present

## 2024-04-02 LAB — BASIC METABOLIC PANEL WITH GFR
Anion gap: 8 (ref 5–15)
BUN: 24 mg/dL — ABNORMAL HIGH (ref 8–23)
CO2: 28 mmol/L (ref 22–32)
Calcium: 8 mg/dL — ABNORMAL LOW (ref 8.9–10.3)
Chloride: 105 mmol/L (ref 98–111)
Creatinine, Ser: <0.3 mg/dL — ABNORMAL LOW (ref 0.61–1.24)
Glucose, Bld: 111 mg/dL — ABNORMAL HIGH (ref 70–99)
Potassium: 3.1 mmol/L — ABNORMAL LOW (ref 3.5–5.1)
Sodium: 141 mmol/L (ref 135–145)

## 2024-04-02 LAB — CULTURE, BLOOD (ROUTINE X 2)
Culture: NO GROWTH
Culture: NO GROWTH
Special Requests: ADEQUATE
Special Requests: ADEQUATE

## 2024-04-02 LAB — CBC
HCT: 27.3 % — ABNORMAL LOW (ref 39.0–52.0)
Hemoglobin: 7.9 g/dL — ABNORMAL LOW (ref 13.0–17.0)
MCH: 22.6 pg — ABNORMAL LOW (ref 26.0–34.0)
MCHC: 28.9 g/dL — ABNORMAL LOW (ref 30.0–36.0)
MCV: 78 fL — ABNORMAL LOW (ref 80.0–100.0)
Platelets: 540 K/uL — ABNORMAL HIGH (ref 150–400)
RBC: 3.5 MIL/uL — ABNORMAL LOW (ref 4.22–5.81)
RDW: 27.4 % — ABNORMAL HIGH (ref 11.5–15.5)
WBC: 11.2 K/uL — ABNORMAL HIGH (ref 4.0–10.5)
nRBC: 0 % (ref 0.0–0.2)

## 2024-04-02 LAB — TYPE AND SCREEN
ABO/RH(D): A NEG
Antibody Screen: NEGATIVE

## 2024-04-02 MED ORDER — POTASSIUM CHLORIDE CRYS ER 20 MEQ PO TBCR: 40.0000 meq | EXTENDED_RELEASE_TABLET | Freq: Two times a day (BID) | ORAL | Status: DC

## 2024-04-02 MED ORDER — POTASSIUM CHLORIDE 20 MEQ PO PACK
40.0000 meq | PACK | Freq: Two times a day (BID) | ORAL | Status: AC
  Administered 2024-04-02 (×2): 40 meq via ORAL
  Filled 2024-04-02 (×2): qty 2

## 2024-04-02 MED ORDER — HYDROCORTISONE SOD SUC (PF) 1000 MG IJ SOLR: 525.0000 mg | Freq: Two times a day (BID) | INTRAMUSCULAR | Status: DC

## 2024-04-02 MED ORDER — HYDROCORTISONE SOD SUC (PF) 100 MG IJ SOLR
25.0000 mg | Freq: Two times a day (BID) | INTRAMUSCULAR | Status: DC
  Administered 2024-04-02 – 2024-04-03 (×2): 25 mg via INTRAVENOUS
  Filled 2024-04-02 (×3): qty 0.5

## 2024-04-02 MED ORDER — ZINC SULFATE 220 (50 ZN) MG PO CAPS
220.0000 mg | ORAL_CAPSULE | Freq: Every day | ORAL | Status: DC
Start: 2024-04-02 — End: 2024-04-10
  Administered 2024-04-02 – 2024-04-10 (×8): 220 mg via ORAL
  Filled 2024-04-02 (×9): qty 1

## 2024-04-02 MED ORDER — MIDODRINE HCL 5 MG PO TABS
10.0000 mg | ORAL_TABLET | Freq: Three times a day (TID) | ORAL | Status: DC
  Administered 2024-04-02 – 2024-04-03 (×2): 10 mg via ORAL
  Filled 2024-04-02 (×2): qty 2
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
## 2024-04-02 NOTE — Assessment & Plan Note (Signed)
 Replace with K-Lor.

## 2024-04-02 NOTE — Progress Notes (Signed)
 CC: sacral decubitus  Subjective: Patient seen and examined.  He feels a little bit better.  Still weak. Enterococcus faecalis bacteremia and Proteus and  clostridium bacteremia secondary to severely infected necrotic sacral decubitus Patient is on unasyn  - continue IV for a total of 2 weeks Objective: Vital signs in last 24 hours: Temp:  [97.5 F (36.4 C)-98.2 F (36.8 C)] 97.5 F (36.4 C) (10/28 1231) Pulse Rate:  [49-56] 53 (10/28 1231) Resp:  [16-20] 16 (10/28 1231) BP: (128-154)/(50-66) 154/59 (10/28 1231) SpO2:  [99 %-100 %] 100 % (10/28 1231) Last BM Date : 03/31/24 (via ostomy)  Intake/Output from previous day: 10/27 0701 - 10/28 0700 In: 960 [P.O.:960] Out: 2975 [Urine:2250; Stool:725] Intake/Output this shift: No intake/output data recorded.  Physical exam:  Chronically ill and debilitated in no acute distress. ABd: soft, nt colostomy in place and working Skin stage IV massive sacral decubitus ulcer involving sacraum and lumbar region decubitus is putrid, with necrotic muscle and skin, additionally there are ischial ulcer bilaterally that have good granulation tissue  Lab Results: CBC  Recent Labs    04/01/24 0406 04/02/24 0552  WBC 10.7* 11.2*  HGB 7.6* 7.9*  HCT 25.5* 27.3*  PLT 569* 540*   BMET Recent Labs    04/01/24 0406 04/02/24 0552  NA 139 141  K 3.3* 3.1*  CL 103 105  CO2 25 28  GLUCOSE 153* 111*  BUN 19 24*  CREATININE 0.49* <0.30*  CALCIUM 7.9* 8.0*   PT/INR No results for input(s): LABPROT, INR in the last 72 hours. ABG No results for input(s): PHART, HCO3 in the last 72 hours.  Invalid input(s): PCO2, PO2  Studies/Results: No results found.  Anti-infectives: Anti-infectives (From admission, onward)    Start     Dose/Rate Route Frequency Ordered Stop   03/29/24 2200  Ampicillin -Sulbactam (UNASYN ) 3 g in sodium chloride  0.9 % 100 mL IVPB        3 g 200 mL/hr over 30 Minutes Intravenous Every 6 hours 03/29/24 1501      03/27/24 1400  meropenem  (MERREM ) 1 g in sodium chloride  0.9 % 100 mL IVPB  Status:  Discontinued        1 g 33.3 mL/hr over 180 Minutes Intravenous Every 8 hours 03/27/24 0756 03/29/24 1501   03/27/24 0500  vancomycin  (VANCOREADY) IVPB 1250 mg/250 mL  Status:  Discontinued        1,250 mg 166.7 mL/hr over 90 Minutes Intravenous Every 12 hours 03/26/24 1629 03/26/24 1918   03/26/24 2200  linezolid (ZYVOX) IVPB 600 mg  Status:  Discontinued        600 mg 300 mL/hr over 60 Minutes Intravenous Every 12 hours 03/26/24 1918 03/29/24 1501   03/26/24 2200  meropenem  (MERREM ) 1 g in sodium chloride  0.9 % 100 mL IVPB  Status:  Discontinued        1 g 200 mL/hr over 30 Minutes Intravenous Every 8 hours 03/26/24 2042 03/27/24 0756   03/26/24 2100  Ampicillin -Sulbactam (UNASYN ) 3 g in sodium chloride  0.9 % 100 mL IVPB  Status:  Discontinued        3 g 200 mL/hr over 30 Minutes Intravenous Every 6 hours 03/26/24 1530 03/26/24 1918   03/26/24 2100  Ampicillin -Sulbactam (UNASYN ) 3 g in sodium chloride  0.9 % 100 mL IVPB  Status:  Discontinued        3 g 200 mL/hr over 30 Minutes Intravenous Every 6 hours 03/26/24 1939 03/26/24 2021   03/26/24 2015  piperacillin-tazobactam (ZOSYN)  IVPB 3.375 g  Status:  Discontinued       Placed in And Linked Group   3.375 g 100 mL/hr over 30 Minutes Intravenous  Once 03/26/24 1918 03/26/24 1939   03/26/24 1445  vancomycin  (VANCOREADY) IVPB 2000 mg/400 mL        2,000 mg 200 mL/hr over 120 Minutes Intravenous  Once 03/26/24 1431 03/26/24 1827   03/26/24 1445  Ampicillin -Sulbactam (UNASYN ) 3 g in sodium chloride  0.9 % 100 mL IVPB        3 g 200 mL/hr over 30 Minutes Intravenous  Once 03/26/24 1431 03/26/24 1614       Assessment/Plan: Massive decubitus ulcer patient that is debilitated.  Once again I had another discussion with the patient.  I was asked to entertain palliative debridement for hygienic measures no t curative intend.  I discussed with him in detail  that this is a possibility as he is insensate and I will try to perform a debridement at the bedside.  Procedure discussed with him in detail there is the benefits and the positive complications he agrees and understands I personally spent a total of 35 minutes in the care of the patient today including performing a medically appropriate exam/evaluation, counseling and educating, placing orders, referring and communicating with other health care professionals, documenting clinical information in the EHR, independently interpreting and reviewing images studies and coordinating care.     Laneta Luna, MD, [facility]  04/02/2024

## 2024-04-02 NOTE — Procedures (Addendum)
  PATIENT:  Lynwood DELENA Eagles  78 y.o. male  PRE-OPERATIVE DIAGNOSIS:  decubitus ulcer stage IVs  POST-OPERATIVE DIAGNOSIS:  Same  PROCEDURE:  Extensive . Excisional debridement of skin subcutaneous tissue, muscle and bone measuring 500 square centimeters ( 20 x 25 cms), Depth 4.5cm Down to bone   SURGEON: D. Gurbani Figge MD  ANESTHESIA: none pt insensate  FINDINGS: massive decubitus ulcer  Slough and Necrotic tissue removed about 1/2 pound of tissue removed  EBL: 5cc  DICTATION:  Patient was explained about procedure in detail. Risks, benefits, possible complications and a consent was obtained. The patient was placed in the lateral decubitus position. Patient was prepped and draped in the usual sterile fashion with antiseptic solution Using scissors we debrided necrotic tissue including, skin, sub q and muscle , please note tissue was necrotic muscle and it did not bleed, it was putrid, I also went down to the bone which was brittle, given that thje bone was necrotic I was able to debrided using forceps and scissors. Using a sponge I debrided all the tissues and irrigated wound profusely with vash. Hemostasis obtained with pressure. THere was abridge of skin and sub q tissue that was excised in order to provide easier wound care, we transformed the wound into a one giant sacral and lumbar wound.  25 x 25 cms total measure and total debrided area 20x 25. Hemostasis obtained with pressure  Dakin solution along with multiple kerlix were used to pack the massive wound followed by abd pads and tape. No complications. Please note that the bone are of debridement was 4 cm2 the rest of the area incorporating 20x25 cms included skin, sub q and muscle. Tool used for debridement: Scissors and forceps Frequency of Dressing changes: daily Measurement of total devitalized tissue: Before Debridement: 20x15cms   After:25 x 20cms    Laneta JULIANNA Luna, MD

## 2024-04-02 NOTE — TOC Progression Note (Signed)
 Transition of Care [facility]) - Progression Note    Patient Details  Name: Bradley [name] MRN: [id number] Date of Birth: 1946-01-19  Transition of Care [facility]) CM/SW Contact  [name], [name] Phone Number: 04/02/2024, 10:02 AM  Clinical Narrative:  Patient has not reviewed LTACH's yet. CSW encouraged him to review and CSW will follow up this afternoon or tomorrow morning.     Barriers to Discharge: Continued Medical Work up               Expected Discharge Plan and Services       Living arrangements for the past 2 months: Single Family Home                                       Social Drivers of Health (SDOH) Interventions SDOH Screenings   Food Insecurity: No Food Insecurity (03/26/2024)  Housing: Low Risk  (03/26/2024)  Transportation Needs: No Transportation Needs (03/26/2024)  Utilities: Not At Risk (03/26/2024)  Depression (PHQ2-9): Low Risk  (03/02/2023)  Financial Resource Strain: Low Risk  (02/13/2024)   Received from [facility]  Social Connections: Unknown (03/26/2024)  Tobacco Use: Medium Risk (03/26/2024)    Readmission Risk Interventions    03/27/2024    3:44 PM  Readmission Risk Prevention Plan  Transportation Screening Complete  PCP or Specialist Appt within 3-5 Days Complete  HRI or Home Care Consult Complete  Social Work Consult for Recovery Care Planning/Counseling Complete  Palliative Care Screening Not Applicable  Medication Review Ocean[employer]) Complete

## 2024-04-02 NOTE — Progress Notes (Signed)
 OT Cancellation Note  Patient Details Name: Bradley [name] MRN: [id number] DOB: 04-05-1946   Cancelled Treatment:    Reason Eval/Treat Not Completed: Other (comment). Upon attempt, pt with nursing for care. Will re-attempt at later date/time.   Kristilyn Coltrane R., MPH, MS, OTR/L ascom [phone] 04/02/24, 2:27 PM
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
## 2024-04-02 NOTE — Plan of Care (Signed)
  Problem: Education: Goal: Knowledge of General Education information will improve Description: Including pain rating scale, medication(s)/side effects and non-pharmacologic comfort measures Outcome: Progressing   Problem: Health Behavior/Discharge Planning: Goal: Ability to manage health-related needs will improve Outcome: Progressing   Problem: Clinical Measurements: Goal: Ability to maintain clinical measurements within normal limits will improve Outcome: Progressing Goal: Will remain free from infection Outcome: Progressing Goal: Diagnostic test results will improve Outcome: Progressing Goal: Respiratory complications will improve Outcome: Progressing Goal: Cardiovascular complication will be avoided Outcome: Progressing   Problem: Activity: Goal: Risk for activity intolerance will decrease Outcome: Progressing   Problem: Nutrition: Goal: Adequate nutrition will be maintained Outcome: Progressing   Problem: Coping: Goal: Level of anxiety will decrease Outcome: Progressing   Problem: Elimination: Goal: Will not experience complications related to bowel motility Outcome: Progressing Goal: Will not experience complications related to urinary retention Outcome: Progressing   Problem: Pain Managment: Goal: General experience of comfort will improve and/or be controlled Outcome: Progressing   Problem: Safety: Goal: Ability to remain free from injury will improve Outcome: Progressing   Problem: Skin Integrity: Goal: Risk for impaired skin integrity will decrease Outcome: Progressing   Problem: Clinical Measurements: Goal: Ability to avoid or minimize complications of infection will improve Outcome: Progressing   Problem: Skin Integrity: Goal: Skin integrity will improve Outcome: Progressing   Problem: Education: Goal: Ability to describe self-care measures that may prevent or decrease complications (Diabetes Survival Skills Education) will improve Outcome:  Progressing Goal: Individualized Educational Video(s) Outcome: Progressing   Problem: Coping: Goal: Ability to adjust to condition or change in health will improve Outcome: Progressing   Problem: Fluid Volume: Goal: Ability to maintain a balanced intake and output will improve Outcome: Progressing   Problem: Health Behavior/Discharge Planning: Goal: Ability to identify and utilize available resources and services will improve Outcome: Progressing Goal: Ability to manage health-related needs will improve Outcome: Progressing   Problem: Metabolic: Goal: Ability to maintain appropriate glucose levels will improve Outcome: Progressing   Problem: Nutritional: Goal: Maintenance of adequate nutrition will improve Outcome: Progressing Goal: Progress toward achieving an optimal weight will improve Outcome: Progressing   Problem: Skin Integrity: Goal: Risk for impaired skin integrity will decrease Outcome: Progressing   Problem: Tissue Perfusion: Goal: Adequacy of tissue perfusion will improve Outcome: Progressing

## 2024-04-03 DIAGNOSIS — G8220 Paraplegia, unspecified: Secondary | ICD-10-CM | POA: Diagnosis not present

## 2024-04-03 DIAGNOSIS — L89894 Pressure ulcer of other site, stage 4: Secondary | ICD-10-CM | POA: Diagnosis not present

## 2024-04-03 DIAGNOSIS — I739 Peripheral vascular disease, unspecified: Secondary | ICD-10-CM | POA: Diagnosis not present

## 2024-04-03 DIAGNOSIS — I10 Essential (primary) hypertension: Secondary | ICD-10-CM | POA: Diagnosis not present

## 2024-04-03 DIAGNOSIS — L89893 Pressure ulcer of other site, stage 3: Secondary | ICD-10-CM | POA: Diagnosis not present

## 2024-04-03 DIAGNOSIS — L89892 Pressure ulcer of other site, stage 2: Secondary | ICD-10-CM | POA: Diagnosis not present

## 2024-04-03 DIAGNOSIS — L89154 Pressure ulcer of sacral region, stage 4: Secondary | ICD-10-CM | POA: Diagnosis not present

## 2024-04-03 DIAGNOSIS — R6521 Severe sepsis with septic shock: Secondary | ICD-10-CM | POA: Diagnosis not present

## 2024-04-03 DIAGNOSIS — A419 Sepsis, unspecified organism: Secondary | ICD-10-CM | POA: Diagnosis not present

## 2024-04-03 LAB — BASIC METABOLIC PANEL WITH GFR
Anion gap: 7 (ref 5–15)
BUN: 21 mg/dL (ref 8–23)
CO2: 29 mmol/L (ref 22–32)
Calcium: 7.9 mg/dL — ABNORMAL LOW (ref 8.9–10.3)
Chloride: 108 mmol/L (ref 98–111)
Creatinine, Ser: <0.3 mg/dL — ABNORMAL LOW (ref 0.61–1.24)
Glucose, Bld: 102 mg/dL — ABNORMAL HIGH (ref 70–99)
Potassium: 4 mmol/L (ref 3.5–5.1)
Sodium: 144 mmol/L (ref 135–145)

## 2024-04-03 LAB — CBC
HCT: 27.3 % — ABNORMAL LOW (ref 39.0–52.0)
Hemoglobin: 8.1 g/dL — ABNORMAL LOW (ref 13.0–17.0)
MCH: 23 pg — ABNORMAL LOW (ref 26.0–34.0)
MCHC: 29.7 g/dL — ABNORMAL LOW (ref 30.0–36.0)
MCV: 77.6 fL — ABNORMAL LOW (ref 80.0–100.0)
Platelets: 552 K/uL — ABNORMAL HIGH (ref 150–400)
RBC: 3.52 MIL/uL — ABNORMAL LOW (ref 4.22–5.81)
RDW: 27.7 % — ABNORMAL HIGH (ref 11.5–15.5)
WBC: 12.6 K/uL — ABNORMAL HIGH (ref 4.0–10.5)
nRBC: 0 % (ref 0.0–0.2)

## 2024-04-03 MED ORDER — HYDROCORTISONE SOD SUC (PF) 100 MG IJ SOLR
25.0000 mg | Freq: Two times a day (BID) | INTRAMUSCULAR | Status: AC
  Administered 2024-04-03 – 2024-04-04 (×2): 25 mg via INTRAVENOUS
  Filled 2024-04-03 (×2): qty 0.5

## 2024-04-03 MED ORDER — MIDODRINE HCL 5 MG PO TABS: 5.0000 mg | ORAL_TABLET | Freq: Three times a day (TID) | ORAL | Status: DC

## 2024-04-03 NOTE — Plan of Care (Signed)
  Problem: Education: Goal: Knowledge of General Education information will improve Description: Including pain rating scale, medication(s)/side effects and non-pharmacologic comfort measures Outcome: Progressing   Problem: Activity: Goal: Risk for activity intolerance will decrease Outcome: Progressing   Problem: Nutrition: Goal: Adequate nutrition will be maintained Outcome: Progressing   Problem: Elimination: Goal: Will not experience complications related to bowel motility Outcome: Progressing Goal: Will not experience complications related to urinary retention Outcome: Progressing   Problem: Pain Managment: Goal: General experience of comfort will improve and/or be controlled Outcome: Progressing   Problem: Safety: Goal: Ability to remain free from injury will improve Outcome: Progressing
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
## 2024-04-03 NOTE — TOC Progression Note (Signed)
 Transition of Care [facility]) - Progression Note    Patient Details  Name: Bradley [name] MRN: [id number] Date of Birth: 1945-12-13  [facility] CM/SW Contact  [name], [name] Phone Number: 04/03/2024, 11:54 AM  Clinical Narrative:   Went by room to talk to patient. He has not decided about LTACH yet.    Barriers to Discharge: Continued Medical Work up               Expected Discharge Plan and Services       Living arrangements for the past 2 months: Single Family Home                                       Social Drivers of Health (SDOH) Interventions SDOH Screenings   Food Insecurity: No Food Insecurity (03/26/2024)  Housing: Low Risk  (03/26/2024)  Transportation Needs: No Transportation Needs (03/26/2024)  Utilities: Not At Risk (03/26/2024)  Depression (PHQ2-9): Low Risk  (03/02/2023)  Financial Resource Strain: Low Risk  (02/13/2024)   Received from [facility]  Social Connections: Unknown (03/26/2024)  Tobacco Use: Medium Risk (03/26/2024)    Readmission Risk Interventions    03/27/2024    3:44 PM  Readmission Risk Prevention Plan  Transportation Screening Complete  PCP or Specialist Appt within 3-5 Days Complete  HRI or Home Care Consult Complete  Social Work Consult for Recovery Care Planning/Counseling Complete  Palliative Care Screening Not Applicable  Medication Review [employer]) Complete
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
## 2024-04-04 DIAGNOSIS — R7881 Bacteremia: Secondary | ICD-10-CM | POA: Diagnosis not present

## 2024-04-04 DIAGNOSIS — D649 Anemia, unspecified: Secondary | ICD-10-CM

## 2024-04-04 DIAGNOSIS — M4628 Osteomyelitis of vertebra, sacral and sacrococcygeal region: Secondary | ICD-10-CM

## 2024-04-04 DIAGNOSIS — S31000A Unspecified open wound of lower back and pelvis without penetration into retroperitoneum, initial encounter: Secondary | ICD-10-CM | POA: Diagnosis not present

## 2024-04-04 DIAGNOSIS — R6521 Severe sepsis with septic shock: Secondary | ICD-10-CM | POA: Diagnosis not present

## 2024-04-04 DIAGNOSIS — I96 Gangrene, not elsewhere classified: Secondary | ICD-10-CM | POA: Diagnosis not present

## 2024-04-04 DIAGNOSIS — A419 Sepsis, unspecified organism: Secondary | ICD-10-CM | POA: Diagnosis not present

## 2024-04-04 DIAGNOSIS — B9689 Other specified bacterial agents as the cause of diseases classified elsewhere: Secondary | ICD-10-CM

## 2024-04-04 DIAGNOSIS — B952 Enterococcus as the cause of diseases classified elsewhere: Secondary | ICD-10-CM | POA: Diagnosis not present

## 2024-04-04 LAB — CBC
HCT: 27.1 % — ABNORMAL LOW (ref 39.0–52.0)
Hemoglobin: 7.9 g/dL — ABNORMAL LOW (ref 13.0–17.0)
MCH: 22.9 pg — ABNORMAL LOW (ref 26.0–34.0)
MCHC: 29.2 g/dL — ABNORMAL LOW (ref 30.0–36.0)
MCV: 78.6 fL — ABNORMAL LOW (ref 80.0–100.0)
Platelets: 485 K/uL — ABNORMAL HIGH (ref 150–400)
RBC: 3.45 MIL/uL — ABNORMAL LOW (ref 4.22–5.81)
RDW: 28.3 % — ABNORMAL HIGH (ref 11.5–15.5)
WBC: 10.8 K/uL — ABNORMAL HIGH (ref 4.0–10.5)
nRBC: 0 % (ref 0.0–0.2)

## 2024-04-04 LAB — BASIC METABOLIC PANEL WITH GFR
Anion gap: 8 (ref 5–15)
BUN: 20 mg/dL (ref 8–23)
CO2: 30 mmol/L (ref 22–32)
Calcium: 8 mg/dL — ABNORMAL LOW (ref 8.9–10.3)
Chloride: 106 mmol/L (ref 98–111)
Creatinine, Ser: 0.33 mg/dL — ABNORMAL LOW (ref 0.61–1.24)
GFR, Estimated: >60 mL/min (ref >60)
Glucose, Bld: 129 mg/dL — ABNORMAL HIGH (ref 70–99)
Potassium: 3.7 mmol/L (ref 3.5–5.1)
Sodium: 144 mmol/L (ref 135–145)

## 2024-04-04 NOTE — Progress Notes (Signed)
 Date of Admission:  03/26/2024      ID: Bradley [name] is a 78 y.o. male  Principal Problem:   Septic shock (HCC) Active Problems:   Iron  deficiency anemia   Sacral wound   Paraplegia (HCC)   HTN (hypertension)   Hypokalemia   Reactive thrombocytosis   Wound infection   Bacteremia   Pressure injury of skin   Sepsis due to Enterococcus (HCC)   Anemia of chronic disease   Bacteremia due to Clostridium species   Bacteremia due to vancomycin  resistant Enterococcus Bradley [name] is a 78 y.o. with a history of T7 spinal cord injury with paraplegia, neurogenic bladder , chronic sacral decubitus S/p colostomy, neurogenic bladder, chronic Foley, left AKA Recent hospitalization at [facility] between 02/09/2024 until 02/21/2024 for infection of the chronic sacral wound and the culture which was positive for MRSA Acinetobacter and mixed organisms and was treated medically with meropenem  1 g 9/-5 9/13 and vancomycin  2 g 9/ 6-/02/2012 and sent on minocycline 200 mg.  And during that admission was thought that he was not a candidate for any type of surgery including flap surgery and so was managed medically. Came To [facility] ED on 9/21 for a wound check because the wound had a malodor and was some bleeding. In the ED vitals BP of 84/75, temperature 99.3, pulse 128 and sats of 100%. WBC was 26.1, Hb 7.5, platelet 802 and creatinine of 0.91., Blood cultures were sent Patient was started on linezolid and Unasyn  which later was changed to Zosyn.  Admitted to ICU  Antibioitc Linezolid and unasyn  Zosyn Linezolid /Meropenem  Now on Unasyn  since 03/29/24  Subjective: Patient iunderwent beside debridement on Tuesday Doing fine  Medications:   vitamin C   500 mg Oral BID   Chlorhexidine  Gluconate Cloth  6 each Topical Daily   enoxaparin  (LOVENOX ) injection  40 mg Subcutaneous Q24H   feeding supplement  237 mL Oral TID BM   midodrine  5 mg Oral TID WC   mirtazapine  7.5 mg Oral QHS   multivitamin with minerals   1 tablet Oral Daily   pantoprazole   40 mg Oral BID   zinc  sulfate (50mg  elemental zinc )  220 mg Oral Daily    Objective: Vital signs in last 24 hours: Patient Vitals for the past 24 hrs:  BP Temp Temp src Pulse Resp SpO2  04/04/24 1218 (!) 147/75 97.9 F (36.6 C) -- 84 17 98 %  04/04/24 0733 (!) 146/70 (!) 97.5 F (36.4 C) -- 74 17 100 %  04/04/24 0339 135/70 97.9 F (36.6 C) -- 75 18 100 %  04/04/24 0013 (!) 149/66 97.7 F (36.5 C) Oral 87 18 100 %  04/03/24 2115 (!) 144/70 (!) 97.5 F (36.4 C) -- 92 18 100 %  04/03/24 1548 (!) 135/56 97.9 F (36.6 C) Oral 82 (!) 23 100 %       PHYSICAL EXAM:  General: Alert, cooperative, no distress, appears young for age . Lungs: Bilateral air entry Heart: Regular rate and rhythm, no murmur, rub or gallop. Abdomen: Soft, non-tender,not distended. Bowel sounds normal. No masses Back picture reviewed today extensive necrotic deep wounds some of them debrided at bed side                 Lymph: Cervical, supraclavicular normal. Neurologic: Paraplegia Left AKA Colostomy Foley catheter Lab Results    Latest Ref Rng & Units 04/04/2024    5:28 AM 04/03/2024    3:09 AM 04/02/2024  5:52 AM  CBC  WBC 4.0 - 10.5 K/uL 10.8  12.6  11.2   Hemoglobin 13.0 - 17.0 g/dL 7.9  8.1  7.9   Hematocrit 39.0 - 52.0 % 27.1  27.3  27.3   Platelets 150 - 400 K/uL 485  552  540        Latest Ref Rng & Units 04/04/2024    5:28 AM 04/03/2024    3:09 AM 04/02/2024    5:52 AM  CMP  Glucose 70 - 99 mg/dL 870  897  888   BUN 8 - 23 mg/dL 20  21  24    Creatinine 0.61 - 1.24 mg/dL 9.66  <9.69  <9.69   Sodium 135 - 145 mmol/L 144  144  141   Potassium 3.5 - 5.1 mmol/L 3.7  4.0  3.1   Chloride 98 - 111 mmol/L 106  108  105   CO2 22 - 32 mmol/L 30  29  28    Calcium 8.9 - 10.3 mg/dL 8.0  7.9  8.0       Microbiology: 1 set of blood culture has Enterococcus and Proteus mirabilis, clostridium ramosum From 03/26/2024. Repeat blood  culture  on 03/28/2024 NG  Assessment/Plan: Enterococcus faecalis bacteremia and Proteus and  clostridium bacteremia secondary to severely infected necrotic sacral decubitus Patient is on unasyn  - continue IV for a total of 2 weeks 04/09/24 Followed by PO augmentin  for 2 more weeks  until 04/23/24  Severe sepsis from stage IV sacral wounds the sacral wounds are necrotic with an element of necrotizing fasciitis and underlying osteomyelitis.  This is chronic because of his paraplegia but worse recently He was at [facility] in September 2025 and surgery was deemed not possible.   We had a case conference with the patient and the nephew It was decided that since he is a high risk surgical candidate we will treat him with antibiotics for the sepsis and bacteremia. Some debridement accomplished at bed side by Dr.pabon on 10/28  Leukocytosis secondary to infection is improving  Hyperbilirubinemia secondary to sepsis  Hypoalbuminemia  Anemia  Paraplegia  Colostomy  Neurogenic bladder with chronic Foley  Left AKA  Discussed the management with the patient and care team
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
## 2024-04-04 NOTE — TOC Progression Note (Signed)
 Transition of Care [facility]) - Progression Note    Patient Details  Name: Bradley [name] MRN: [id number] Date of Birth: 1946/06/01  [facility] CM/SW Contact  [name], [name] Phone Number: 04/04/2024, 2:25 PM  Clinical Narrative:   Patient is leaning towards [facility]. He has questions about how insurance covers. [facility] liaison called him to answer his questions. She said he gave consent to start insurance authorization.    Barriers to Discharge: Continued Medical Work up               Expected Discharge Plan and Services       Living arrangements for the past 2 months: Single Family Home                                       Social Drivers of Health (SDOH) Interventions SDOH Screenings   Food Insecurity: No Food Insecurity (03/26/2024)  Housing: Low Risk  (03/26/2024)  Transportation Needs: No Transportation Needs (03/26/2024)  Utilities: Not At Risk (03/26/2024)  Depression (PHQ2-9): Low Risk  (03/02/2023)  Financial Resource Strain: Low Risk  (02/13/2024)   Received from [facility]  Social Connections: Unknown (03/26/2024)  Tobacco Use: Medium Risk (03/26/2024)    Readmission Risk Interventions    03/27/2024    3:44 PM  Readmission Risk Prevention Plan  Transportation Screening Complete  PCP or Specialist Appt within 3-5 Days Complete  HRI or Home Care Consult Complete  Social Work Consult for Recovery Care Planning/Counseling Complete  Palliative Care Screening Not Applicable  Medication Review Oceanographer) Complete

## 2024-04-04 NOTE — Plan of Care (Signed)
  Problem: Education: Goal: Knowledge of General Education information will improve Description: Including pain rating scale, medication(s)/side effects and non-pharmacologic comfort measures Outcome: Progressing   Problem: Activity: Goal: Risk for activity intolerance will decrease Outcome: Progressing   Problem: Nutrition: Goal: Adequate nutrition will be maintained Outcome: Progressing   Problem: Elimination: Goal: Will not experience complications related to bowel motility Outcome: Progressing Goal: Will not experience complications related to urinary retention Outcome: Progressing   Problem: Pain Managment: Goal: General experience of comfort will improve and/or be controlled Outcome: Progressing   Problem: Safety: Goal: Ability to remain free from injury will improve Outcome: Progressing

## 2024-04-05 DIAGNOSIS — R6521 Severe sepsis with septic shock: Secondary | ICD-10-CM | POA: Diagnosis not present

## 2024-04-05 DIAGNOSIS — A419 Sepsis, unspecified organism: Secondary | ICD-10-CM | POA: Diagnosis not present

## 2024-04-05 MED ORDER — MIDODRINE HCL 5 MG PO TABS: 5.0000 mg | ORAL_TABLET | Freq: Three times a day (TID) | ORAL | Status: DC | PRN

## 2024-04-05 MED ORDER — LOPERAMIDE HCL 2 MG PO CAPS
2.0000 mg | ORAL_CAPSULE | ORAL | Status: DC | PRN
  Administered 2024-04-06: 2 mg via ORAL
  Filled 2024-04-05: qty 1

## 2024-04-05 MED ORDER — AMOXICILLIN-POT CLAVULANATE 875-125 MG PO TABS: 1.0000 | ORAL_TABLET | Freq: Two times a day (BID) | ORAL | Status: DC
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
## 2024-04-05 NOTE — Plan of Care (Signed)
  Problem: Education: Goal: Knowledge of General Education information will improve Description: Including pain rating scale, medication(s)/side effects and non-pharmacologic comfort measures Outcome: Progressing   Problem: Activity: Goal: Risk for activity intolerance will decrease Outcome: Progressing   Problem: Nutrition: Goal: Adequate nutrition will be maintained Outcome: Progressing   Problem: Elimination: Goal: Will not experience complications related to bowel motility Outcome: Progressing Goal: Will not experience complications related to urinary retention Outcome: Progressing   Problem: Pain Managment: Goal: General experience of comfort will improve and/or be controlled Outcome: Progressing   Problem: Safety: Goal: Ability to remain free from injury will improve Outcome: Progressing
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
## 2024-04-05 NOTE — TOC Progression Note (Signed)
 Transition of Care [facility]) - Progression Note    Patient Details  Name: Bradley [name] MRN: [id number] Date of Birth: [birth year]  [facility] CM/SW Contact  Racheal LITTIE Schimke, RN Phone Number: 04/05/2024, 3:41 PM  Clinical Narrative: [name], from [facility] called, requested MD name and phone number for possible Peer Review, Auth. Pending will keep us  posted.        Barriers to Discharge: Continued Medical Work up               Expected Discharge Plan and Services       Living arrangements for the past 2 months: Single Family Home                                       Social Drivers of Health (SDOH) Interventions SDOH Screenings   Food Insecurity: No Food Insecurity (03/26/2024)  Housing: Low Risk  (03/26/2024)  Transportation Needs: No Transportation Needs (03/26/2024)  Utilities: Not At Risk (03/26/2024)  Depression (PHQ2-9): Low Risk  (03/02/2023)  Financial Resource Strain: Low Risk  (02/13/2024)   Received from [facility]  Social Connections: Unknown (03/26/2024)  Tobacco Use: Medium Risk (03/26/2024)    Readmission Risk Interventions    03/27/2024    3:44 PM  Readmission Risk Prevention Plan  Transportation Screening Complete  PCP or Specialist Appt within 3-5 Days Complete  HRI or Home Care Consult Complete  Social Work Consult for Recovery Care Planning/Counseling Complete  Palliative Care Screening Not Applicable  Medication Review Oceanographer) Complete

## 2024-04-06 DIAGNOSIS — R6521 Severe sepsis with septic shock: Secondary | ICD-10-CM | POA: Diagnosis not present

## 2024-04-06 DIAGNOSIS — A419 Sepsis, unspecified organism: Secondary | ICD-10-CM | POA: Diagnosis not present

## 2024-04-06 LAB — CBC
HCT: 28 % — ABNORMAL LOW (ref 39.0–52.0)
Hemoglobin: 8.4 g/dL — ABNORMAL LOW (ref 13.0–17.0)
MCH: 23.3 pg — ABNORMAL LOW (ref 26.0–34.0)
MCHC: 30 g/dL (ref 30.0–36.0)
MCV: 77.8 fL — ABNORMAL LOW (ref 80.0–100.0)
Platelets: 449 K/uL — ABNORMAL HIGH (ref 150–400)
RBC: 3.6 MIL/uL — ABNORMAL LOW (ref 4.22–5.81)
RDW: 29.3 % — ABNORMAL HIGH (ref 11.5–15.5)
WBC: 11.8 K/uL — ABNORMAL HIGH (ref 4.0–10.5)
nRBC: 0 % (ref 0.0–0.2)

## 2024-04-06 LAB — BASIC METABOLIC PANEL WITH GFR
Anion gap: 9 (ref 5–15)
BUN: 18 mg/dL (ref 8–23)
CO2: 32 mmol/L (ref 22–32)
Calcium: 7.8 mg/dL — ABNORMAL LOW (ref 8.9–10.3)
Chloride: 102 mmol/L (ref 98–111)
Creatinine, Ser: 0.35 mg/dL — ABNORMAL LOW (ref 0.61–1.24)
GFR, Estimated: >60 mL/min (ref >60)
Glucose, Bld: 74 mg/dL (ref 70–99)
Potassium: 3.1 mmol/L — ABNORMAL LOW (ref 3.5–5.1)
Sodium: 143 mmol/L (ref 135–145)

## 2024-04-06 LAB — MAGNESIUM: Magnesium: 1.9 mg/dL (ref 1.7–2.4)

## 2024-04-06 MED ORDER — LACTATED RINGERS IV BOLUS
500.0000 mL | Freq: Once | INTRAVENOUS | Status: AC
  Administered 2024-04-06: 500 mL via INTRAVENOUS

## 2024-04-06 MED ORDER — POTASSIUM CHLORIDE CRYS ER 20 MEQ PO TBCR
40.0000 meq | EXTENDED_RELEASE_TABLET | ORAL | Status: AC
  Administered 2024-04-06 (×2): 40 meq via ORAL
  Filled 2024-04-06 (×2): qty 2

## 2024-04-06 MED ORDER — LACTATED RINGERS IV SOLN: INTRAVENOUS | Status: AC
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
## 2024-04-07 DIAGNOSIS — R6521 Severe sepsis with septic shock: Secondary | ICD-10-CM | POA: Diagnosis not present

## 2024-04-07 DIAGNOSIS — A419 Sepsis, unspecified organism: Secondary | ICD-10-CM | POA: Diagnosis not present

## 2024-04-07 LAB — CBC
HCT: 28.1 % — ABNORMAL LOW (ref 39.0–52.0)
Hemoglobin: 8.2 g/dL — ABNORMAL LOW (ref 13.0–17.0)
MCH: 23.1 pg — ABNORMAL LOW (ref 26.0–34.0)
MCHC: 29.2 g/dL — ABNORMAL LOW (ref 30.0–36.0)
MCV: 79.2 fL — ABNORMAL LOW (ref 80.0–100.0)
Platelets: 414 K/uL — ABNORMAL HIGH (ref 150–400)
RBC: 3.55 MIL/uL — ABNORMAL LOW (ref 4.22–5.81)
RDW: 29.7 % — ABNORMAL HIGH (ref 11.5–15.5)
WBC: 11.9 K/uL — ABNORMAL HIGH (ref 4.0–10.5)
nRBC: 0 % (ref 0.0–0.2)

## 2024-04-07 LAB — BASIC METABOLIC PANEL WITH GFR
Anion gap: 8 (ref 5–15)
BUN: 17 mg/dL (ref 8–23)
CO2: 30 mmol/L (ref 22–32)
Calcium: 7.9 mg/dL — ABNORMAL LOW (ref 8.9–10.3)
Chloride: 103 mmol/L (ref 98–111)
Creatinine, Ser: 0.39 mg/dL — ABNORMAL LOW (ref 0.61–1.24)
GFR, Estimated: >60 mL/min (ref >60)
Glucose, Bld: 72 mg/dL (ref 70–99)
Potassium: 4.3 mmol/L (ref 3.5–5.1)
Sodium: 141 mmol/L (ref 135–145)

## 2024-04-07 LAB — MAGNESIUM: Magnesium: 2.1 mg/dL (ref 1.7–2.4)

## 2024-04-07 LAB — VITAMIN A: Vitamin A (Retinoic Acid): 53.1 ug/dL (ref 22.0–69.5)

## 2024-04-07 LAB — GLUCOSE, CAPILLARY: Glucose-Capillary: 122 mg/dL — ABNORMAL HIGH (ref 70–99)

## 2024-04-07 MED ORDER — LACTATED RINGERS IV SOLN: INTRAVENOUS | Status: DC

## 2024-04-07 MED ORDER — DAKINS (1/2 STRENGTH) 0.25 % EX SOLN
CUTANEOUS | Status: DC
  Filled 2024-04-07 (×2): qty 473

## 2024-04-07 NOTE — Progress Notes (Signed)
 PT Cancellation Note  Patient Details Name: CRANSTON KOORS MRN: [id number] DOB: Aug 27, 1945   Cancelled Treatment:    Reason Eval/Treat Not Completed: Fatigue/lethargy limiting ability to participate  Offered and encouraged session.  Pt refused all interventions.  I don't feel like it  Risks/benefits education provided.     Lauraine Gills 04/07/2024, 10:18 AM
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
## 2024-04-07 NOTE — Plan of Care (Signed)

## 2024-04-08 DIAGNOSIS — S31000A Unspecified open wound of lower back and pelvis without penetration into retroperitoneum, initial encounter: Secondary | ICD-10-CM | POA: Diagnosis not present

## 2024-04-08 DIAGNOSIS — B964 Proteus (mirabilis) (morganii) as the cause of diseases classified elsewhere: Secondary | ICD-10-CM | POA: Diagnosis not present

## 2024-04-08 DIAGNOSIS — B952 Enterococcus as the cause of diseases classified elsewhere: Secondary | ICD-10-CM | POA: Diagnosis not present

## 2024-04-08 DIAGNOSIS — I96 Gangrene, not elsewhere classified: Secondary | ICD-10-CM | POA: Diagnosis not present

## 2024-04-08 DIAGNOSIS — A419 Sepsis, unspecified organism: Secondary | ICD-10-CM | POA: Diagnosis not present

## 2024-04-08 DIAGNOSIS — R6521 Severe sepsis with septic shock: Secondary | ICD-10-CM | POA: Diagnosis not present

## 2024-04-08 LAB — CBC
HCT: 29.7 % — ABNORMAL LOW (ref 39.0–52.0)
Hemoglobin: 8.8 g/dL — ABNORMAL LOW (ref 13.0–17.0)
MCH: 23.2 pg — ABNORMAL LOW (ref 26.0–34.0)
MCHC: 29.6 g/dL — ABNORMAL LOW (ref 30.0–36.0)
MCV: 78.4 fL — ABNORMAL LOW (ref 80.0–100.0)
Platelets: 402 K/uL — ABNORMAL HIGH (ref 150–400)
RBC: 3.79 MIL/uL — ABNORMAL LOW (ref 4.22–5.81)
RDW: 29.3 % — ABNORMAL HIGH (ref 11.5–15.5)
WBC: 9.8 K/uL (ref 4.0–10.5)
nRBC: 0 % (ref 0.0–0.2)

## 2024-04-08 LAB — BASIC METABOLIC PANEL WITH GFR
Anion gap: 7 (ref 5–15)
BUN: 19 mg/dL (ref 8–23)
CO2: 27 mmol/L (ref 22–32)
Calcium: 8 mg/dL — ABNORMAL LOW (ref 8.9–10.3)
Chloride: 101 mmol/L (ref 98–111)
Creatinine, Ser: 0.34 mg/dL — ABNORMAL LOW (ref 0.61–1.24)
GFR, Estimated: >60 mL/min (ref >60)
Glucose, Bld: 126 mg/dL — ABNORMAL HIGH (ref 70–99)
Potassium: 3.9 mmol/L (ref 3.5–5.1)
Sodium: 135 mmol/L (ref 135–145)

## 2024-04-08 LAB — TSH: TSH: 3.566 u[IU]/mL (ref 0.350–4.500)

## 2024-04-08 LAB — MAGNESIUM: Magnesium: 2.1 mg/dL (ref 1.7–2.4)

## 2024-04-08 LAB — D-DIMER, QUANTITATIVE: D-Dimer, Quant: 2.33 ug{FEU}/mL — ABNORMAL HIGH (ref 0.00–0.50)
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
## 2024-04-08 NOTE — TOC Progression Note (Signed)
 Transition of Care [facility]) - Progression Note    Patient Details  Name: Bradley [name] MRN: [id number] Date of Birth: 1946-03-31  Transition of Care [facility]) CM/SW Contact  [name], [name] Phone Number: 04/08/2024, 2:51 PM  Clinical Narrative:   [facility] insurance authorization is still pending.    Barriers to Discharge: Continued Medical Work up               Expected Discharge Plan and Services       Living arrangements for the past 2 months: Single Family Home                                       Social Drivers of Health (SDOH) Interventions SDOH Screenings   Food Insecurity: No Food Insecurity (03/26/2024)  Housing: Low Risk  (03/26/2024)  Transportation Needs: No Transportation Needs (03/26/2024)  Utilities: Not At Risk (03/26/2024)  Depression (PHQ2-9): Low Risk  (03/02/2023)  Financial Resource Strain: Low Risk  (02/13/2024)   [facility] from [facility]  Social Connections: Unknown (03/26/2024)  Tobacco Use: Medium Risk (03/26/2024)    Readmission Risk Interventions    03/27/2024    3:44 PM  Readmission Risk Prevention Plan  Transportation Screening Complete  PCP or Specialist Appt within 3-5 Days Complete  HRI or Home Care Consult Complete  Social Work Consult for Recovery Care Planning/Counseling Complete  Palliative Care Screening Not Applicable  Medication Review Oceanographer) Complete

## 2024-04-08 NOTE — Progress Notes (Signed)
 Date of Admission:  03/26/2024      ID: ESTELLE SKIBICKI is a 78 y.o. male  Principal Problem:   Septic shock (HCC) Active Problems:   Iron  deficiency anemia   Sacral wound   Paraplegia (HCC)   HTN (hypertension)   Hypokalemia   Reactive thrombocytosis   Wound infection   Bacteremia   Pressure injury of skin   Sepsis due to Enterococcus (HCC)   Anemia of chronic disease   Bacteremia due to Clostridium species   Bacteremia due to vancomycin  resistant Enterococcus TRES GRZYWACZ is a 78 y.o. with a history of T7 spinal cord injury with paraplegia, neurogenic bladder , chronic sacral decubitus S/p colostomy, neurogenic bladder, chronic Foley, left AKA Recent hospitalization at [facility] between 02/09/2024 until 02/21/2024 for infection of the chronic sacral wound and the culture which was positive for MRSA Acinetobacter and mixed organisms and was treated medically with meropenem  1 g 9/-5 9/13 and vancomycin  2 g 9/ 6-/02/2012 and sent on minocycline 200 mg.  And during that admission was thought that he was not a candidate for any type of surgery including flap surgery and so was managed medically. Came To [facility] ED on 9/21 for a wound check because the wound had a malodor and was some bleeding. In the ED vitals BP of 84/75, temperature 99.3, pulse 128 and sats of 100%. WBC was 26.1, Hb 7.5, platelet 802 and creatinine of 0.91., Blood cultures were sent Patient was started on linezolid and Unasyn  which later was changed to Zosyn.  Admitted to ICU  Antibioitc Linezolid and unasyn  Zosyn Linezolid /Meropenem  Now on Unasyn  since 03/29/24  Subjective: Pt is doing fine  Medications:   [START ON 04/10/2024] amoxicillin -clavulanate  1 tablet Oral Q12H   vitamin C   500 mg Oral BID   Chlorhexidine  Gluconate Cloth  6 each Topical Daily   enoxaparin  (LOVENOX ) injection  40 mg Subcutaneous Q24H   feeding supplement  237 mL Oral TID BM   mirtazapine  7.5 mg Oral QHS   multivitamin with minerals  1  tablet Oral Daily   pantoprazole   40 mg Oral BID   sodium hypochlorite   Irrigation UD   zinc  sulfate (50mg  elemental zinc )  220 mg Oral Daily    Objective: Vital signs in last 24 hours: Patient Vitals for the past 24 hrs:  BP Temp Temp src Pulse Resp SpO2  04/08/24 0919 119/62 98.7 F (37.1 C) -- 97 18 100 %  04/08/24 0512 118/62 98.7 F (37.1 C) Oral 100 20 100 %  04/07/24 2314 (!) 120/57 99.5 F (37.5 C) Oral (!) 102 20 100 %  04/07/24 2100 117/63 99.7 F (37.6 C) Oral (!) 105 20 100 %  04/07/24 1542 124/67 100.3 F (37.9 C) -- (!) 102 18 99 %       PHYSICAL EXAM:  General: Alert, cooperative, no distress, appears young for age .Lungs: Bilateral air entry Heart: Regular rate and rhythm, no murmur, rub or gallop. Abdomen: Soft, colostomy foley extensive necrotic deep wounds some of them debrided at bed side            Neurologic: Paraplegia Left AKA  Lab Results    Latest Ref Rng & Units 04/08/2024    9:57 AM 04/07/2024    4:54 AM 04/06/2024    5:41 AM  CBC  WBC 4.0 - 10.5 K/uL 9.8  11.9  11.8   Hemoglobin 13.0 - 17.0 g/dL 8.8  8.2  8.4   Hematocrit 39.0 -  52.0 % 29.7  28.1  28.0   Platelets 150 - 400 K/uL 402  414  449        Latest Ref Rng & Units 04/08/2024    4:27 AM 04/07/2024    4:54 AM 04/06/2024    5:41 AM  CMP  Glucose 70 - 99 mg/dL 873  72  74   BUN 8 - 23 mg/dL 19  17  18    Creatinine 0.61 - 1.24 mg/dL 9.65  9.60  9.64   Sodium 135 - 145 mmol/L 135  141  143   Potassium 3.5 - 5.1 mmol/L 3.9  4.3  3.1   Chloride 98 - 111 mmol/L 101  103  102   CO2 22 - 32 mmol/L 27  30  32   Calcium 8.9 - 10.3 mg/dL 8.0  7.9  7.8       Microbiology: 1 set of blood culture has Enterococcus and Proteus mirabilis, clostridium ramosum From 03/26/2024. Repeat blood culture  on 03/28/2024 NG  Assessment/Plan: Enterococcus faecalis bacteremia and Proteus and  clostridium bacteremia secondary to severely infected necrotic sacral decubitus Patient is on  unasyn  - continue IV for a total of 2 weeks 04/09/24 After that will do a month of augmnentin PO   Severe sepsis from stage IV sacral wounds the sacral wounds are necrotic with an element of necrotizing fasciitis and underlying osteomyelitis.  This is chronic because of his paraplegia but worse recently He was at [facility] in September 2025 and surgery was deemed not possible.   We also had a case conference with the patient and the nephew when he was in ICU. It was decided that since he is a high risk surgical candidate we will treat him with antibiotics for the sepsis and bacteremia. Some debridement accomplished at bed side by Dr.pabon on 10/28 He is currently on IV unasyn  day 13 o IV antibiotic . On 04/10/24 will switch him to PO augmentin  for a month until 05/09/24  Leukocytosis secondary to infection has resolved  Hyperbilirubinemia secondary to sepsis  Hypoalbuminemia  Anemia  Paraplegia  Colostomy  Neurogenic bladder with chronic Foley  Left AKA  Discussed the management with the patient and care team
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
## 2024-04-08 NOTE — Progress Notes (Signed)
 Attempted PIV insertion needed for CT. Unable to get one. IV consult put in for IV team to place a 20 gauge needed. No IV team until 2300. Will contact CT when we are able to place one.

## 2024-04-09 ENCOUNTER — Ambulatory Visit: Admitting: Physician Assistant

## 2024-04-09 ENCOUNTER — Inpatient Hospital Stay

## 2024-04-09 DIAGNOSIS — A419 Sepsis, unspecified organism: Secondary | ICD-10-CM | POA: Diagnosis not present

## 2024-04-09 DIAGNOSIS — R6521 Severe sepsis with septic shock: Secondary | ICD-10-CM | POA: Diagnosis not present

## 2024-04-09 DIAGNOSIS — J9811 Atelectasis: Secondary | ICD-10-CM | POA: Diagnosis not present

## 2024-04-09 DIAGNOSIS — J90 Pleural effusion, not elsewhere classified: Secondary | ICD-10-CM | POA: Diagnosis not present

## 2024-04-09 LAB — BASIC METABOLIC PANEL WITH GFR
Anion gap: 8 (ref 5–15)
BUN: 18 mg/dL (ref 8–23)
CO2: 26 mmol/L (ref 22–32)
Calcium: 7.9 mg/dL — ABNORMAL LOW (ref 8.9–10.3)
Chloride: 101 mmol/L (ref 98–111)
Creatinine, Ser: 0.43 mg/dL — ABNORMAL LOW (ref 0.61–1.24)
GFR, Estimated: >60 mL/min (ref >60)
Glucose, Bld: 70 mg/dL (ref 70–99)
Potassium: 3.7 mmol/L (ref 3.5–5.1)
Sodium: 135 mmol/L (ref 135–145)

## 2024-04-09 LAB — MAGNESIUM: Magnesium: 1.9 mg/dL (ref 1.7–2.4)

## 2024-04-09 LAB — LACTIC ACID, PLASMA: Lactic Acid, Venous: 2 mmol/L (ref 0.5–1.9)

## 2024-04-09 MED ORDER — SODIUM CHLORIDE 0.9 % IV SOLN
3.0000 g | Freq: Four times a day (QID) | INTRAVENOUS | Status: DC
  Administered 2024-04-09 – 2024-04-10 (×4): 3 g via INTRAVENOUS
  Filled 2024-04-09 (×6): qty 8

## 2024-04-09 MED ORDER — IOHEXOL 350 MG/ML SOLN
75.0000 mL | Freq: Once | INTRAVENOUS | Status: AC | PRN
  Administered 2024-04-09: 75 mL via INTRAVENOUS

## 2024-04-09 MED ORDER — AMOXICILLIN-POT CLAVULANATE 875-125 MG PO TABS
1.0000 | ORAL_TABLET | Freq: Two times a day (BID) | ORAL | Status: DC
Start: 2024-04-10 — End: 2024-04-09
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
## 2024-04-09 NOTE — TOC Progression Note (Addendum)
 Transition of Care [facility]) - Progression Note    Patient Details  Name: Bradley [name] MRN: [id number] Date of Birth: 1945-11-08  Transition of Care [facility]) CM/SW Contact  [name], [name] Phone Number: 04/09/2024, 2:44 PM  Clinical Narrative:  [facility] [employer] insurance authorization is still pending.   4:37 pm: [name] is approved. [facility] can accept patient tomorrow.    Barriers to Discharge: Continued Medical Work up               Expected Discharge Plan and Services       Living arrangements for the past 2 months: Single Family Home                                       Social Drivers of Health (SDOH) Interventions SDOH Screenings   Food Insecurity: No Food Insecurity (03/26/2024)  Housing: Low Risk  (03/26/2024)  Transportation Needs: No Transportation Needs (03/26/2024)  Utilities: Not At Risk (03/26/2024)  Depression (PHQ2-9): Low Risk  (03/02/2023)  Financial Resource Strain: Low Risk  (02/13/2024)   Received [facility]  Social Connections: Unknown (03/26/2024)  Tobacco Use: Medium Risk (03/26/2024)    Readmission Risk Interventions    03/27/2024    3:44 PM  Readmission Risk Prevention Plan  Transportation Screening Complete  PCP or Specialist Appt within 3-5 Days Complete  HRI or Home Care Consult Complete  Social Work Consult for Recovery Care Planning/Counseling Complete  Palliative Care Screening Not Applicable  Medication Review Oceanographer) Complete
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
## 2024-04-09 NOTE — Plan of Care (Signed)
  Problem: Education: Goal: Knowledge of General Education information will improve Description: Including pain rating scale, medication(s)/side effects and non-pharmacologic comfort measures Outcome: Progressing   Problem: Health Behavior/Discharge Planning: Goal: Ability to manage health-related needs will improve Outcome: Progressing   Problem: Clinical Measurements: Goal: Ability to maintain clinical measurements within normal limits will improve Outcome: Progressing Goal: Will remain free from infection Outcome: Progressing Goal: Diagnostic test results will improve Outcome: Progressing Goal: Respiratory complications will improve Outcome: Progressing Goal: Cardiovascular complication will be avoided Outcome: Progressing   Problem: Activity: Goal: Risk for activity intolerance will decrease Outcome: Progressing   Problem: Nutrition: Goal: Adequate nutrition will be maintained Outcome: Progressing   Problem: Coping: Goal: Level of anxiety will decrease Outcome: Progressing   Problem: Elimination: Goal: Will not experience complications related to bowel motility Outcome: Progressing Goal: Will not experience complications related to urinary retention Outcome: Progressing   Problem: Pain Managment: Goal: General experience of comfort will improve and/or be controlled Outcome: Progressing   Problem: Safety: Goal: Ability to remain free from injury will improve Outcome: Progressing   Problem: Skin Integrity: Goal: Risk for impaired skin integrity will decrease Outcome: Progressing   Problem: Clinical Measurements: Goal: Ability to avoid or minimize complications of infection will improve Outcome: Progressing   Problem: Skin Integrity: Goal: Skin integrity will improve Outcome: Progressing   Problem: Education: Goal: Ability to describe self-care measures that may prevent or decrease complications (Diabetes Survival Skills Education) will improve Outcome:  Progressing Goal: Individualized Educational Video(s) Outcome: Progressing   Problem: Coping: Goal: Ability to adjust to condition or change in health will improve Outcome: Progressing   Problem: Fluid Volume: Goal: Ability to maintain a balanced intake and output will improve Outcome: Progressing   Problem: Health Behavior/Discharge Planning: Goal: Ability to identify and utilize available resources and services will improve Outcome: Progressing Goal: Ability to manage health-related needs will improve Outcome: Progressing   Problem: Metabolic: Goal: Ability to maintain appropriate glucose levels will improve Outcome: Progressing   Problem: Nutritional: Goal: Maintenance of adequate nutrition will improve Outcome: Progressing Goal: Progress toward achieving an optimal weight will improve Outcome: Progressing   Problem: Skin Integrity: Goal: Risk for impaired skin integrity will decrease Outcome: Progressing   Problem: Tissue Perfusion: Goal: Adequacy of tissue perfusion will improve Outcome: Progressing

## 2024-04-10 DIAGNOSIS — L89154 Pressure ulcer of sacral region, stage 4: Secondary | ICD-10-CM

## 2024-04-10 DIAGNOSIS — Z7189 Other specified counseling: Secondary | ICD-10-CM | POA: Diagnosis not present

## 2024-04-10 DIAGNOSIS — R6521 Severe sepsis with septic shock: Secondary | ICD-10-CM | POA: Diagnosis not present

## 2024-04-10 DIAGNOSIS — B964 Proteus (mirabilis) (morganii) as the cause of diseases classified elsewhere: Secondary | ICD-10-CM | POA: Diagnosis not present

## 2024-04-10 DIAGNOSIS — B952 Enterococcus as the cause of diseases classified elsewhere: Secondary | ICD-10-CM | POA: Diagnosis not present

## 2024-04-10 DIAGNOSIS — A419 Sepsis, unspecified organism: Secondary | ICD-10-CM | POA: Diagnosis not present

## 2024-04-10 DIAGNOSIS — R7881 Bacteremia: Secondary | ICD-10-CM | POA: Diagnosis not present

## 2024-04-10 LAB — BASIC METABOLIC PANEL WITH GFR
Anion gap: 9 (ref 5–15)
BUN: 14 mg/dL (ref 8–23)
CO2: 26 mmol/L (ref 22–32)
Calcium: 8.2 mg/dL — ABNORMAL LOW (ref 8.9–10.3)
Chloride: 103 mmol/L (ref 98–111)
Creatinine, Ser: 0.45 mg/dL — ABNORMAL LOW (ref 0.61–1.24)
GFR, Estimated: >60 mL/min (ref >60)
Glucose, Bld: 85 mg/dL (ref 70–99)
Potassium: 3.2 mmol/L — ABNORMAL LOW (ref 3.5–5.1)
Sodium: 138 mmol/L (ref 135–145)

## 2024-04-10 LAB — HEPATIC FUNCTION PANEL
ALT: 12 U/L (ref 0–44)
AST: 19 U/L (ref 15–41)
Albumin: 1.8 g/dL — ABNORMAL LOW (ref 3.5–5.0)
Alkaline Phosphatase: 96 U/L (ref 38–126)
Bilirubin, Direct: 0.2 mg/dL (ref 0.0–0.2)
Indirect Bilirubin: 0.5 mg/dL (ref 0.3–0.9)
Total Bilirubin: 0.7 mg/dL (ref 0.0–1.2)
Total Protein: 6.4 g/dL — ABNORMAL LOW (ref 6.5–8.1)

## 2024-04-10 LAB — CBC
HCT: 26.4 % — ABNORMAL LOW (ref 39.0–52.0)
Hemoglobin: 7.8 g/dL — ABNORMAL LOW (ref 13.0–17.0)
MCH: 23.1 pg — ABNORMAL LOW (ref 26.0–34.0)
MCHC: 29.5 g/dL — ABNORMAL LOW (ref 30.0–36.0)
MCV: 78.3 fL — ABNORMAL LOW (ref 80.0–100.0)
Platelets: 321 K/uL (ref 150–400)
RBC: 3.37 MIL/uL — ABNORMAL LOW (ref 4.22–5.81)
RDW: 28.3 % — ABNORMAL HIGH (ref 11.5–15.5)
WBC: 7.2 K/uL (ref 4.0–10.5)
nRBC: 0 % (ref 0.0–0.2)

## 2024-04-10 LAB — LACTIC ACID, PLASMA: Lactic Acid, Venous: 0.7 mmol/L (ref 0.5–1.9)

## 2024-04-10 MED ORDER — ENSURE PLUS HIGH PROTEIN PO LIQD: 237.0000 mL | Freq: Three times a day (TID) | ORAL | Status: AC

## 2024-04-10 MED ORDER — BIOTENE DRY MOUTH MT LIQD: 15.0000 mL | OROMUCOSAL | Status: AC | PRN

## 2024-04-10 MED ORDER — AMOXICILLIN-POT CLAVULANATE 875-125 MG PO TABS: 1.0000 | ORAL_TABLET | Freq: Two times a day (BID) | ORAL | Status: AC

## 2024-04-10 MED ORDER — ORAL CARE MOUTH RINSE: 15.0000 mL | OROMUCOSAL | Status: AC | PRN

## 2024-04-10 NOTE — TOC Transition Note (Signed)
 Transition of Care [facility]) - Discharge Note   Patient Details  Name: Bradley [name] MRN: [id number] Date of Birth: 1946-03-16  Transition of Care [name]) CM/SW Contact:  [name], [name] Phone Number: 04/10/2024, 3:41 PM   Clinical Narrative:  Patient has orders to discharge to [facility] today. RN will call report to [phone] or [phone] (Room 208). Accepting physician is Dr. Redell Dee. Charge nurse will arrange ambulance transport. MD has completed EMTALA. No further concerns. CSW signing off.   Final next level of care: Long Term Acute Care (LTAC) Barriers to Discharge: Barriers Resolved   Patient Goals and CMS Choice            Discharge Placement              Patient chooses bed at: Other - please specify in the comment section below: ([facility]) Patient to be transferred to facility by: [facility] Name of family member notified: [name] Patient and family notified of of transfer: 04/10/24  Discharge Plan and Services Additional resources added to the After Visit Summary for                                       Social Drivers of Health (SDOH) Interventions SDOH Screenings   Food Insecurity: No Food Insecurity (03/26/2024)  Housing: Low Risk  (03/26/2024)  Transportation Needs: No Transportation Needs (03/26/2024)  Utilities: Not At Risk (03/26/2024)  Depression (PHQ2-9): Low Risk  (03/02/2023)  Financial Resource Strain: Low Risk  (02/13/2024)   [facility]  Social Connections: Unknown (03/26/2024)  Tobacco Use: Medium Risk (03/26/2024)     Readmission Risk Interventions    03/27/2024    3:44 PM  Readmission Risk Prevention Plan  Transportation Screening Complete  PCP or Specialist Appt within 3-5 Days Complete  HRI or Home Care Consult Complete  Social Work Consult for Recovery Care Planning/Counseling Complete  Palliative Care Screening Not Applicable  Medication Review Oceanographer)  Complete

## 2024-04-10 NOTE — TOC Progression Note (Signed)
 Transition of Care [facility]) - Progression Note    Patient Details  Name: Bradley [name] MRN: [id number] Date of Birth: November 29, 1945  Transition of Care [facility]) CM/SW Contact  [name], [name] Phone Number: 04/10/2024, 12:53 PM  Clinical Narrative:  MD confirmed patient can discharge to [facility] today. [facility] liaison met with patient this morning and provided update.     Barriers to Discharge: Continued Medical Work up               Expected Discharge Plan and Services       Living arrangements for the past 2 months: Single Family Home                                       Social Drivers of Health (SDOH) Interventions SDOH Screenings   Food Insecurity: No Food Insecurity (03/26/2024)  Housing: Low Risk  (03/26/2024)  Transportation Needs: No Transportation Needs (03/26/2024)  Utilities: Not At Risk (03/26/2024)  Depression (PHQ2-9): Low Risk  (03/02/2023)  Financial Resource Strain: Low Risk  (02/13/2024)   [facility] from [facility]  Social Connections: Unknown (03/26/2024)  Tobacco Use: Medium Risk (03/26/2024)    Readmission Risk Interventions    03/27/2024    3:44 PM  Readmission Risk Prevention Plan  Transportation Screening Complete  PCP or Specialist Appt within 3-5 Days Complete  HRI or Home Care Consult Complete  Social Work Consult for Recovery Care Planning/Counseling Complete  Palliative Care Screening Not Applicable  Medication Review Oceanographer) Complete

## 2024-04-10 NOTE — Progress Notes (Signed)
 Date of Admission:  03/26/2024      ID: Bradley [name] is a 78 y.o. male  Principal Problem:   Septic shock (HCC) Active Problems:   Iron  deficiency anemia   Sacral wound   Paraplegia (HCC)   HTN (hypertension)   Hypokalemia   Reactive thrombocytosis   Wound infection   Bacteremia   Pressure injury of skin   Sepsis due to Enterococcus (HCC)   Anemia of chronic disease   Bacteremia due to Clostridium species   Bacteremia due to vancomycin  resistant Enterococcus Bradley [name] is a 78 y.o. with a history of T7 spinal cord injury with paraplegia, neurogenic bladder , chronic sacral decubitus S/p colostomy, neurogenic bladder, chronic Foley, left AKA Recent hospitalization at [facility] between 02/09/2024 until 02/21/2024 for infection of the chronic sacral wound and the culture which was positive for MRSA Acinetobacter and mixed organisms and was treated medically with meropenem  1 g 9/-5 9/13 and vancomycin  2 g 9/ 6-/02/2012 and sent on minocycline 200 mg.  And during that admission was thought that he was not a candidate for any type of surgery including flap surgery and so was managed medically. Came To [facility] ED on 9/21 for a wound check because the wound had a malodor and was some bleeding. In the ED vitals BP of 84/75, temperature 99.3, pulse 128 and sats of 100%. WBC was 26.1, Hb 7.5, platelet 802 and creatinine of 0.91., Blood cultures were sent Patient was started on linezolid and Unasyn  which later was changed to Zosyn.    Antibioitc Linezolid and unasyn  Zosyn Linezolid /Meropenem  Now on Unasyn  since 03/29/24  Subjective: Pt isfeeling okay Thoug a temp of 100.4 was recorded yesterday HE doe snot have any cough, sob, diarrhea, N/V  Medications:   vitamin C   500 mg Oral BID   Chlorhexidine  Gluconate Cloth  6 each Topical Daily   enoxaparin  (LOVENOX ) injection  40 mg Subcutaneous Q24H   feeding supplement  237 mL Oral TID BM   mirtazapine  7.5 mg Oral QHS   multivitamin with  minerals  1 tablet Oral Daily   pantoprazole   40 mg Oral BID   sodium hypochlorite   Irrigation UD   zinc  sulfate (50mg  elemental zinc )  220 mg Oral Daily    Objective: Vital signs in last 24 hours: Patient Vitals for the past 24 hrs:  BP Temp Pulse Resp SpO2  04/10/24 1156 (!) 107/54 98.4 F (36.9 C) (!) 109 18 100 %  04/10/24 0807 (!) 100/48 97.6 F (36.4 C) 96 18 100 %  04/10/24 0418 (!) 111/52 97.8 F (36.6 C) 93 18 100 %  04/09/24 2304 (!) 113/49 -- (!) 113 -- --  04/09/24 2257 (!) 108/33 100 F (37.8 C) (!) 115 20 100 %  04/09/24 2026 123/62 (!) 100.4 F (38 C) (!) 115 18 100 %  04/09/24 1600 -- 99.8 F (37.7 C) -- -- --  04/09/24 1538 (!) 123/54 (!) 100.4 F (38 C) (!) 114 18 100 %       PHYSICAL EXAM:  General: Alert, cooperative, no distress, does not look septic  .Lungs: Bilateral air entry Heart: Regular rate and rhythm, no murmur, rub or gallop. Abdomen: Soft, colostomy Foley  Wound not assessed today-  extensive necrotic deep wounds some of them debrided at bed side by Dr.Pabon last week      Neurologic: Paraplegia Left AKA  Lab Results    Latest Ref Rng & Units 04/10/2024    6:18 AM  04/08/2024    9:57 AM 04/07/2024    4:54 AM  CBC  WBC 4.0 - 10.5 K/uL 7.2  9.8  11.9   Hemoglobin 13.0 - 17.0 g/dL 7.8  8.8  8.2   Hematocrit 39.0 - 52.0 % 26.4  29.7  28.1   Platelets 150 - 400 K/uL 321  402  414        Latest Ref Rng & Units 04/10/2024    6:18 AM 04/10/2024    6:11 AM 04/09/2024    3:43 AM  CMP  Glucose 70 - 99 mg/dL 85   70   BUN 8 - 23 mg/dL 14   18   Creatinine 9.38 - 1.24 mg/dL 9.54   9.56   Sodium 864 - 145 mmol/L 138   135   Potassium 3.5 - 5.1 mmol/L 3.2   3.7   Chloride 98 - 111 mmol/L 103   101   CO2 22 - 32 mmol/L 26   26   Calcium 8.9 - 10.3 mg/dL 8.2   7.9   Total Protein 6.5 - 8.1 g/dL  6.4    Total Bilirubin 0.0 - 1.2 mg/dL  0.7    Alkaline Phos 38 - 126 U/L  96    AST 15 - 41 U/L  19    ALT 0 - 44 U/L  12         Microbiology: 1 set of blood culture has Enterococcus and Proteus mirabilis, clostridium ramosum From 03/26/2024. Repeat blood culture  on 03/28/2024 NG BC sent on 04/09/24  Assessment/Plan: Enterococcus faecalis bacteremia and Proteus and  clostridium bacteremia secondary to severely infected necrotic sacral decubitus Patient is on unasyn  - The original plan was to do IV for a total of 2 weeks 04/09/24, followed by a month of  augmnentin PO   Severe sepsis from stage IV sacral wounds --the sacral wounds are necrotic with an element of necrotizing fasciitis and underlying osteomyelitis.  This is chronic because of his paraplegia but got worse recently He was at [facility] in September 2025 and surgery was deemed not possible.   We also had a case conference with the patient and the nephew when he was in ICU. It was decided that since he is a high risk surgical candidate we will treat him with antibiotics for the sepsis and bacteremia. Some debridement accomplished at bed side by Dr.pabon on 10/28 He is currently on IV unasyn  day 15 of IV antibiotic . The original plan was On 04/10/24 to switch him to PO augmentin  for a month until 05/09/24 But patient does have low grade fever and Blood culture has been sent The source of fever is likely the sacral wound as there are areas of necrotic putrid tissue that could not be  debrided ebcause  the surgeon's concern for extensive debridement was how to close the wound. The patient is fully aware that he is at high risk for recurrent sepsis   Pt is getting transferred to LTAC and I understand from the hospitalist they will follow Blood culture and have the expertise to manage the patient.  Leukocytosis  has resolved  Hyperbilirubinemia secondary to sepsis  Hypoalbuminemia  Anemia  Paraplegia  Colostomy  Neurogenic bladder with chronic Foley  Left AKA  Discussed the management with the patient and Hospitalist Dr. Al-Sultani

## 2024-04-10 NOTE — Progress Notes (Signed)
 Daily Progress Note   Patient Name: Bradley [name]       Date: 04/10/2024 DOB: 1946/01/06  Age: 78 y.o. MRN#: [id number] Attending Physician: Al-Sultani, Anmar, MD Primary Care Physician: Alla Amis, MD Admit Date: 03/26/2024  Reason for Consultation/Follow-up: Establishing goals of care  Subjective: PMT in to see patient. He states he is cold and requests temperature to be increased, thermostat adjusted.  He denies other needs at this time.  He states he and his son have completed forms as needed in order for him to discharge to skilled nursing, and states he is ready to go.  He shares that he has completed forms for his son to be his surrogate decision maker and has had in-depth conversations with him regarding care that he would and would not want, and what timelines to care would look like.  He states that if things change he will speak with his son about.  As he stated during previous visit, he states he does not want to discuss this further.  Length of Stay: 15  Current Medications: Scheduled Meds:   vitamin C   500 mg Oral BID   Chlorhexidine  Gluconate Cloth  6 each Topical Daily   enoxaparin  (LOVENOX ) injection  40 mg Subcutaneous Q24H   feeding supplement  237 mL Oral TID BM   mirtazapine  7.5 mg Oral QHS   multivitamin with minerals  1 tablet Oral Daily   pantoprazole   40 mg Oral BID   sodium hypochlorite   Irrigation UD   zinc  sulfate (50mg  elemental zinc )  220 mg Oral Daily    Continuous Infusions:  ampicillin -sulbactam (UNASYN ) IV 3 g (04/10/24 0957)   lactated ringers  75 mL/hr at 04/10/24 0617    PRN Meds: acetaminophen , antiseptic oral rinse, guaiFENesin, ondansetron , mouth rinse, oxyCODONE -acetaminophen , polyethylene glycol  Physical Exam Pulmonary:      Effort: Pulmonary effort is normal.  Skin:    General: Skin is warm and dry.  Neurological:     Mental Status: He is alert.             Vital Signs: BP (!) 100/48 (BP Location: Right Arm)   Pulse 96   Temp 97.6 F (36.4 C)   Resp 18   Ht 6' 2 (1.88 m)   Wt 71.2 kg   SpO2 100%  BMI 20.15 kg/m  SpO2: SpO2: 100 % O2 Device: O2 Device: Room Air O2 Flow Rate:    Intake/output summary:  Intake/Output Summary (Last 24 hours) at 04/10/2024 1149 Last data filed at 04/10/2024 1006 Gross per 24 hour  Intake 1387.5 ml  Output 3390 ml  Net -2002.5 ml   LBM: Last BM Date : 04/09/24 (colostomy in place) Baseline Weight: Weight: 96 kg Most recent weight: Weight: 71.2 kg   Patient Active Problem List   Diagnosis Date Noted   Anemia of chronic disease 04/01/2024   Bacteremia due to Clostridium species 04/01/2024   Bacteremia due to vancomycin  resistant Enterococcus 04/01/2024   Bacteremia 03/27/2024   Pressure injury of skin 03/27/2024   Sepsis due to Enterococcus (HCC) 03/27/2024   Wound infection 03/26/2024   Septic shock (HCC) 03/26/2024   Microcytosis 01/09/2024   Reactive thrombocytosis 01/09/2024   S/P AKA (above knee amputation), left (HCC) 09/13/2023   Acute osteomyelitis of left calcaneus (HCC) 08/07/2023   Acute cystitis without hematuria 05/04/2023   Pressure injury of sacral region, stage 4 (HCC) 05/04/2023   Sacral osteomyelitis (HCC) 05/04/2023   Osteomyelitis of pelvic region or thigh, acute, left (HCC) 01/26/2023   HTN (hypertension) 01/26/2023   Hypokalemia 01/26/2023   SIRS (systemic inflammatory response syndrome) (HCC)    Neurogenic bladder 08/21/2018   Recurrent UTI 08/21/2018   Hx of spinal cord injury 08/21/2018   Sacral wound 06/01/2018   Osteomyelitis (HCC) 06/01/2018   Paraplegia (HCC) 06/01/2018   Iron  deficiency anemia 04/19/2017    Palliative Care Assessment & Plan    Recommendations/Plan: Patient does not wish to discuss goals of care  with PMT. PMT will sign off  Code Status:    Code Status Orders  (From admission, onward)           Start     Ordered   03/26/24 1533  Full code  Continuous       Question:  By:  Answer:  Consent: discussion documented in EHR   03/26/24 1533           Code Status History     Date Active Date Inactive Code Status Order ID Comments User Context   05/04/2023 0427 05/08/2023 2120 Full Code [id number]  Sebastian Toribio GAILS, MD ED   01/26/2023 1836 02/03/2023 1800 Full Code [id number]  Hilma Rankins, MD ED   06/03/2019 0802 06/05/2019 2201 Full Code [id number]  Silvester Ales, MD ED   06/01/2018 2350 06/09/2018 0127 Full Code [id number]  Jenel Lenis, MD ED       Camelia Lewis, NP  Please contact Palliative Medicine Team phone at [phone] for questions and concerns.

## 2024-04-10 NOTE — Discharge Summary (Signed)
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 NEGATIVE Final    Comment: (NOTE) The Xpert Xpress SARS-CoV-2/FLU/RSV plus assay is intended as an aid in the diagnosis of influenza from Nasopharyngeal swab specimens and should not be used as a sole basis for treatment. Nasal washings and aspirates are unacceptable for Xpert Xpress SARS-CoV-2/FLU/RSV testing.  Fact Sheet for Patients: bloggercourse.com  Fact Sheet for Healthcare Providers: seriousbroker.it  This test is not yet approved or cleared by the [place]  FDA and has been authorized for detection and/or diagnosis of SARS-CoV-2 by FDA under an Emergency Use Authorization (EUA). This EUA will remain in effect (meaning this test can be used) for the duration of the COVID-19 declaration under Section 564(b)(1) of the Act, 21 U.S.C. section 360bbb-3(b)(1), unless the authorization is terminated or revoked.     Resp Syncytial Virus by PCR NEGATIVE NEGATIVE Final    Comment: (NOTE) Fact Sheet for Patients: bloggercourse.com  Fact Sheet for Healthcare  Providers: seriousbroker.it  This test is not yet approved or cleared by the [place]  FDA and has been authorized for detection and/or diagnosis of SARS-CoV-2 by FDA under an Emergency Use Authorization (EUA). This EUA will remain in effect (meaning this test can be used) for the duration of the COVID-19 declaration under Section 564(b)(1) of the Act, 21 U.S.C. section 360bbb-3(b)(1), unless the authorization is terminated or revoked.  Performed at [facility], [address] [place], [place] [address]   Blood Culture (routine x 2)     Status: Abnormal   Collection Time: 03/26/24  2:03 PM   Specimen: BLOOD  Result Value Ref Range Status   Specimen Description   Final    BLOOD LEFT ANTECUBITAL Performed at [facility], [address] [place], [place] [address]    Special Requests   Final    BOTTLES DRAWN AEROBIC AND ANAEROBIC Blood Culture adequate volume Performed at [facility], [address] [place], [place] [address]    Culture  Setup Time   Final    GRAM POSITIVE COCCI AEROBIC BOTTLE ONLY GRAM POSITIVE RODS ANAEROBIC BOTTLE ONLY CRITICAL RESULT CALLED TO, READ BACK BY AND VERIFIED WITH: TIFFANY GILCHRIST 03/27/24 9177 MW    Culture (A)  Final    ENTEROCOCCUS FAECALIS PROTEUS MIRABILIS VANCOMYCIN  RESISTANT ENTEROCOCCUS ISOLATED CRITICAL RESULT CALLED TO, READ BACK BY AND VERIFIED WITH: PHARMD DUSTIN ZEIGLER [id number] AT 955 AM BY CM CLOSTRIDIUM RAMOSUM Standardized susceptibility testing for this organism is not available. Performed at [facility], [address] [place], [place] [address]    Report Status 03/29/2024 FINAL  Final   Organism ID, Bacteria ENTEROCOCCUS FAECALIS  Final   Organism ID, Bacteria PROTEUS  MIRABILIS  Final      Susceptibility   Enterococcus faecalis - MIC*    AMPICILLIN  <=2 SENSITIVE Sensitive     VANCOMYCIN  >=32 RESISTANT Resistant     GENTAMICIN SYNERGY SENSITIVE  Sensitive     * ENTEROCOCCUS FAECALIS   Proteus mirabilis - MIC*    AMPICILLIN  <=2 SENSITIVE Sensitive     CEFAZOLIN  (NON-URINE) 8 RESISTANT Resistant     CEFEPIME  <=0.12 SENSITIVE Sensitive     ERTAPENEM <=0.12 SENSITIVE Sensitive     CEFTRIAXONE  <=0.25 SENSITIVE Sensitive     CIPROFLOXACIN >=4 RESISTANT Resistant     GENTAMICIN <=1 SENSITIVE Sensitive     MEROPENEM  1 SENSITIVE Sensitive     TRIMETH/SULFA <=20 SENSITIVE Sensitive     AMPICILLIN /SULBACTAM <=2 SENSITIVE Sensitive     PIP/TAZO Value in next row Sensitive      <=4 SENSITIVEThis is a modified FDA-approved test that has been validated and its performance characteristics determined by the reporting laboratory.  This laboratory is certified under the Clinical Laboratory Improvement Amendments CLIA as qualified to perform high complexity clinical laboratory testing.    * PROTEUS MIRABILIS  Blood Culture (routine x 2)     Status: None   Collection Time: 03/26/24  2:03 PM   Specimen: BLOOD  Result Value Ref Range Status   Specimen Description BLOOD RIGHT ANTECUBITAL  Final   Special Requests   Final    BOTTLES DRAWN AEROBIC AND ANAEROBIC Blood Culture results may not be optimal due to an inadequate volume of blood received in culture bottles   Culture   Final    NO GROWTH 5 DAYS Performed at [facility], [address] [place], [place] [address]    Report Status 03/31/2024 FINAL  Final  Blood Culture ID Panel (Reflexed)     Status: Abnormal   Collection Time: 03/26/24  2:03 PM  Result Value Ref Range Status   Enterococcus faecalis DETECTED (A) NOT DETECTED Final    Comment: CRITICAL RESULT CALLED TO, READ BACK BY AND VERIFIED WITH: TIFFANY GILCHRIST 03/27/24 9177 MW    Enterococcus Faecium NOT DETECTED NOT DETECTED Final   Listeria monocytogenes NOT DETECTED NOT DETECTED Final   Staphylococcus species NOT DETECTED NOT DETECTED Final   Staphylococcus aureus (BCID) NOT DETECTED NOT DETECTED Final    Staphylococcus epidermidis NOT DETECTED NOT DETECTED Final   Staphylococcus lugdunensis NOT DETECTED NOT DETECTED Final   Streptococcus species NOT DETECTED NOT DETECTED Final   Streptococcus agalactiae NOT DETECTED NOT DETECTED Final   Streptococcus pneumoniae NOT DETECTED NOT DETECTED Final   Streptococcus pyogenes NOT DETECTED NOT DETECTED Final   A.calcoaceticus-baumannii NOT DETECTED NOT DETECTED Final   Bacteroides fragilis NOT DETECTED NOT DETECTED Final   Enterobacterales NOT DETECTED NOT DETECTED Final   Enterobacter cloacae complex NOT DETECTED NOT DETECTED Final   Escherichia coli NOT DETECTED NOT DETECTED Final   Klebsiella aerogenes NOT DETECTED NOT DETECTED Final   Klebsiella oxytoca NOT DETECTED NOT DETECTED Final   Klebsiella pneumoniae NOT DETECTED NOT DETECTED Final   Proteus species NOT DETECTED NOT DETECTED Final   Salmonella species NOT DETECTED NOT DETECTED Final   Serratia marcescens NOT DETECTED NOT DETECTED Final   Haemophilus influenzae NOT DETECTED NOT DETECTED Final   Neisseria meningitidis NOT DETECTED NOT DETECTED Final   Pseudomonas aeruginosa NOT DETECTED NOT DETECTED Final   Stenotrophomonas maltophilia NOT DETECTED NOT DETECTED Final   Candida albicans NOT DETECTED NOT DETECTED Final   Candida auris NOT DETECTED NOT DETECTED Final  Candida glabrata NOT DETECTED NOT DETECTED Final   Candida krusei NOT DETECTED NOT DETECTED Final   Candida parapsilosis NOT DETECTED NOT DETECTED Final   Candida tropicalis NOT DETECTED NOT DETECTED Final   Cryptococcus neoformans/gattii NOT DETECTED NOT DETECTED Final   Vancomycin  resistance DETECTED (A) NOT DETECTED Final    Comment: CRITICAL RESULT CALLED TO, READ BACK BY AND VERIFIED WITH: [facility] GILCHRIST 03/27/24 9177 MW Performed at [facility], [address] [place], [place] [address]   Urine Culture     Status: Abnormal   Collection Time: 03/26/24  6:44 PM   Specimen: Urine, Random  Result Value  Ref Range Status   Specimen Description   Final    URINE, RANDOM Performed at [facility], [address], [place], [place] [address]    Special Requests   Final    NONE Reflexed from [id number] Performed at [facility], [address] [place], [place] [address]    Culture >=100,000 COLONIES/mL PROVIDENCIA STUARTII (A)  Final   Report Status 03/28/2024 FINAL  Final   Organism ID, Bacteria PROVIDENCIA STUARTII (A)  Final      Susceptibility   Providencia stuartii - MIC*    AMPICILLIN  >=32 RESISTANT Resistant     CEFEPIME  <=0.12 SENSITIVE Sensitive     ERTAPENEM <=0.12 SENSITIVE Sensitive     CEFTRIAXONE  <=0.25 SENSITIVE Sensitive     CIPROFLOXACIN >=4 RESISTANT Resistant     GENTAMICIN RESISTANT Resistant     NITROFURANTOIN  256 RESISTANT Resistant     TRIMETH/SULFA <=20 SENSITIVE Sensitive     AMPICILLIN /SULBACTAM >=32 RESISTANT Resistant     PIP/TAZO Value in next row Sensitive      <=4 SENSITIVEThis is a modified FDA-approved test that has been validated and its performance characteristics determined by the reporting laboratory.  This laboratory is certified under the Clinical Laboratory Improvement Amendments CLIA as qualified to perform high complexity clinical laboratory testing.    MEROPENEM  Value in next row Sensitive      <=4 SENSITIVEThis is a modified FDA-approved test that has been validated and its performance characteristics determined by the reporting laboratory.  This laboratory is certified under the Clinical Laboratory Improvement Amendments CLIA as qualified to perform high complexity clinical laboratory testing.    * >=100,000 COLONIES/mL PROVIDENCIA STUARTII  Culture, blood (Routine X 2) w Reflex to ID Panel     Status: None   Collection Time: 03/28/24  3:38 PM   Specimen: BLOOD  Result Value Ref Range Status   Specimen Description BLOOD BLOOD RIGHT HAND  Final   Special Requests   Final    BOTTLES DRAWN AEROBIC AND ANAEROBIC Blood Culture  adequate volume   Culture   Final    NO GROWTH 5 DAYS Performed at [facility], [address] [place], [place] [address]    Report Status 04/02/2024 FINAL  Final  Culture, blood (Routine X 2) w Reflex to ID Panel     Status: None   Collection Time: 03/28/24  3:38 PM   Specimen: BLOOD  Result Value Ref Range Status   Specimen Description BLOOD BLOOD RIGHT HAND  Final   Special Requests   Final    BOTTLES DRAWN AEROBIC AND ANAEROBIC Blood Culture adequate volume   Culture   Final    NO GROWTH 5 DAYS Performed at [facility], [address], [place], [place] [address]    Report Status 04/02/2024 FINAL  Final  Culture, blood (Routine X 2) w Reflex to  ID Panel     Status: None (Preliminary result)   Collection Time: 04/09/24 10:15 PM   Specimen: BLOOD  Result Value Ref Range Status   Specimen Description BLOOD BLOOD RIGHT HAND  Final   Special Requests   Final    BOTTLES DRAWN AEROBIC AND ANAEROBIC Blood Culture adequate volume   Culture   Final    NO GROWTH < 12 HOURS Performed at [facility], [address] [place], [place] [address]    Report Status PENDING  Incomplete  Culture, blood (Routine X 2) w Reflex to ID Panel     Status: None (Preliminary result)   Collection Time: 04/09/24 10:15 PM   Specimen: BLOOD  Result Value Ref Range Status   Specimen Description BLOOD BLOOD LEFT HAND  Final   Special Requests   Final    BOTTLES DRAWN AEROBIC AND ANAEROBIC Blood Culture adequate volume   Culture   Final    NO GROWTH < 12 HOURS Performed at [facility], [address] [place], [place] [address]    Report Status PENDING  Incomplete    Labs: CBC: Recent Labs  Lab 04/04/24 0528 04/06/24 0541 04/07/24 0454 04/08/24 0957 04/10/24 0618  WBC 10.8* 11.8* 11.9* 9.8 7.2  HGB 7.9* 8.4* 8.2* 8.8* 7.8*  HCT 27.1* 28.0* 28.1* 29.7* 26.4*  MCV 78.6* 77.8* 79.2* 78.4* 78.3*  PLT 485* 449* 414* 402* 321   Basic Metabolic  Panel: Recent Labs  Lab 04/06/24 0541 04/07/24 0454 04/08/24 0427 04/09/24 0343 04/10/24 0618  NA 143 141 135 135 138  K 3.1* 4.3 3.9 3.7 3.2*  CL 102 103 101 101 103  CO2 32 30 27 26 26   GLUCOSE 74 72 126* 70 85  BUN 18 17 19 18 14   CREATININE 0.35* 0.39* 0.34* 0.43* 0.45*  CALCIUM 7.8* 7.9* 8.0* 7.9* 8.2*  MG 1.9 2.1 2.1 1.9  --    Liver Function Tests: Recent Labs  Lab 04/10/24 0611  AST 19  ALT 12  ALKPHOS 96  BILITOT 0.7  PROT 6.4*  ALBUMIN 1.8*   CBG: Recent Labs  Lab 04/07/24 2101  GLUCAP 122*    Discharge time spent: 45 minutes  Signed: Duffy Larch, [name] 04/10/2024

## 2024-04-11 DIAGNOSIS — L89154 Pressure ulcer of sacral region, stage 4: Secondary | ICD-10-CM | POA: Diagnosis not present

## 2024-04-11 DIAGNOSIS — A419 Sepsis, unspecified organism: Secondary | ICD-10-CM | POA: Diagnosis not present

## 2024-04-11 DIAGNOSIS — G8220 Paraplegia, unspecified: Secondary | ICD-10-CM | POA: Diagnosis not present

## 2024-04-12 DIAGNOSIS — L89154 Pressure ulcer of sacral region, stage 4: Secondary | ICD-10-CM | POA: Diagnosis not present

## 2024-04-12 DIAGNOSIS — G8220 Paraplegia, unspecified: Secondary | ICD-10-CM | POA: Diagnosis not present

## 2024-04-13 DIAGNOSIS — L89154 Pressure ulcer of sacral region, stage 4: Secondary | ICD-10-CM | POA: Diagnosis not present

## 2024-04-13 DIAGNOSIS — G8220 Paraplegia, unspecified: Secondary | ICD-10-CM | POA: Diagnosis not present

## 2024-04-14 DIAGNOSIS — L89154 Pressure ulcer of sacral region, stage 4: Secondary | ICD-10-CM | POA: Diagnosis not present

## 2024-04-14 DIAGNOSIS — G8220 Paraplegia, unspecified: Secondary | ICD-10-CM | POA: Diagnosis not present

## 2024-04-14 LAB — CULTURE, BLOOD (ROUTINE X 2)
Culture: NO GROWTH
Culture: NO GROWTH
Special Requests: ADEQUATE
Special Requests: ADEQUATE

## 2024-04-15 ENCOUNTER — Encounter: Payer: Self-pay | Admitting: Oncology

## 2024-04-15 DIAGNOSIS — R5381 Other malaise: Secondary | ICD-10-CM | POA: Diagnosis not present

## 2024-04-15 DIAGNOSIS — D649 Anemia, unspecified: Secondary | ICD-10-CM | POA: Diagnosis not present

## 2024-04-15 DIAGNOSIS — N319 Neuromuscular dysfunction of bladder, unspecified: Secondary | ICD-10-CM | POA: Diagnosis not present

## 2024-04-15 DIAGNOSIS — L89154 Pressure ulcer of sacral region, stage 4: Secondary | ICD-10-CM | POA: Diagnosis not present

## 2024-04-16 DIAGNOSIS — L89154 Pressure ulcer of sacral region, stage 4: Secondary | ICD-10-CM | POA: Diagnosis not present

## 2024-04-16 DIAGNOSIS — R5381 Other malaise: Secondary | ICD-10-CM | POA: Diagnosis not present

## 2024-04-16 DIAGNOSIS — D649 Anemia, unspecified: Secondary | ICD-10-CM | POA: Diagnosis not present

## 2024-04-16 DIAGNOSIS — N319 Neuromuscular dysfunction of bladder, unspecified: Secondary | ICD-10-CM | POA: Diagnosis not present

## 2024-04-17 ENCOUNTER — Other Ambulatory Visit

## 2024-04-17 DIAGNOSIS — D649 Anemia, unspecified: Secondary | ICD-10-CM | POA: Diagnosis not present

## 2024-04-17 DIAGNOSIS — N319 Neuromuscular dysfunction of bladder, unspecified: Secondary | ICD-10-CM | POA: Diagnosis not present

## 2024-04-17 DIAGNOSIS — L89154 Pressure ulcer of sacral region, stage 4: Secondary | ICD-10-CM | POA: Diagnosis not present

## 2024-04-17 DIAGNOSIS — R5381 Other malaise: Secondary | ICD-10-CM | POA: Diagnosis not present

## 2024-04-18 DIAGNOSIS — E878 Other disorders of electrolyte and fluid balance, not elsewhere classified: Secondary | ICD-10-CM | POA: Diagnosis not present

## 2024-04-18 DIAGNOSIS — R5381 Other malaise: Secondary | ICD-10-CM | POA: Diagnosis not present

## 2024-04-19 DIAGNOSIS — R5381 Other malaise: Secondary | ICD-10-CM | POA: Diagnosis not present

## 2024-04-20 DIAGNOSIS — R5381 Other malaise: Secondary | ICD-10-CM | POA: Diagnosis not present

## 2024-04-20 DIAGNOSIS — E878 Other disorders of electrolyte and fluid balance, not elsewhere classified: Secondary | ICD-10-CM | POA: Diagnosis not present

## 2024-04-21 DIAGNOSIS — R5381 Other malaise: Secondary | ICD-10-CM | POA: Diagnosis not present

## 2024-04-22 DIAGNOSIS — D649 Anemia, unspecified: Secondary | ICD-10-CM | POA: Diagnosis not present

## 2024-04-22 DIAGNOSIS — N319 Neuromuscular dysfunction of bladder, unspecified: Secondary | ICD-10-CM | POA: Diagnosis not present

## 2024-04-22 DIAGNOSIS — R5381 Other malaise: Secondary | ICD-10-CM | POA: Diagnosis not present

## 2024-04-23 DIAGNOSIS — D649 Anemia, unspecified: Secondary | ICD-10-CM | POA: Diagnosis not present

## 2024-04-23 DIAGNOSIS — N319 Neuromuscular dysfunction of bladder, unspecified: Secondary | ICD-10-CM | POA: Diagnosis not present

## 2024-04-23 DIAGNOSIS — R5381 Other malaise: Secondary | ICD-10-CM | POA: Diagnosis not present

## 2024-04-23 DIAGNOSIS — L89154 Pressure ulcer of sacral region, stage 4: Secondary | ICD-10-CM | POA: Diagnosis not present

## 2024-04-24 ENCOUNTER — Ambulatory Visit: Admitting: Oncology

## 2024-04-24 ENCOUNTER — Ambulatory Visit

## 2024-04-24 DIAGNOSIS — N319 Neuromuscular dysfunction of bladder, unspecified: Secondary | ICD-10-CM | POA: Diagnosis not present

## 2024-04-24 DIAGNOSIS — D649 Anemia, unspecified: Secondary | ICD-10-CM | POA: Diagnosis not present

## 2024-04-24 DIAGNOSIS — R5381 Other malaise: Secondary | ICD-10-CM | POA: Diagnosis not present

## 2024-04-25 DIAGNOSIS — R5381 Other malaise: Secondary | ICD-10-CM | POA: Diagnosis not present

## 2024-04-26 DIAGNOSIS — E878 Other disorders of electrolyte and fluid balance, not elsewhere classified: Secondary | ICD-10-CM | POA: Diagnosis not present

## 2024-04-26 DIAGNOSIS — R5381 Other malaise: Secondary | ICD-10-CM | POA: Diagnosis not present

## 2024-04-29 DIAGNOSIS — D649 Anemia, unspecified: Secondary | ICD-10-CM | POA: Diagnosis not present

## 2024-04-29 DIAGNOSIS — L89154 Pressure ulcer of sacral region, stage 4: Secondary | ICD-10-CM | POA: Diagnosis not present

## 2024-04-29 DIAGNOSIS — R5381 Other malaise: Secondary | ICD-10-CM | POA: Diagnosis not present

## 2024-04-29 DIAGNOSIS — N319 Neuromuscular dysfunction of bladder, unspecified: Secondary | ICD-10-CM | POA: Diagnosis not present

## 2024-04-30 DIAGNOSIS — N319 Neuromuscular dysfunction of bladder, unspecified: Secondary | ICD-10-CM | POA: Diagnosis not present

## 2024-04-30 DIAGNOSIS — R5381 Other malaise: Secondary | ICD-10-CM | POA: Diagnosis not present

## 2024-04-30 DIAGNOSIS — D649 Anemia, unspecified: Secondary | ICD-10-CM | POA: Diagnosis not present

## 2024-05-01 DIAGNOSIS — N319 Neuromuscular dysfunction of bladder, unspecified: Secondary | ICD-10-CM | POA: Diagnosis not present

## 2024-05-01 DIAGNOSIS — R5381 Other malaise: Secondary | ICD-10-CM | POA: Diagnosis not present

## 2024-05-01 DIAGNOSIS — L89154 Pressure ulcer of sacral region, stage 4: Secondary | ICD-10-CM | POA: Diagnosis not present

## 2024-05-01 DIAGNOSIS — D649 Anemia, unspecified: Secondary | ICD-10-CM | POA: Diagnosis not present

## 2024-05-02 DIAGNOSIS — D649 Anemia, unspecified: Secondary | ICD-10-CM | POA: Diagnosis not present

## 2024-05-02 DIAGNOSIS — N319 Neuromuscular dysfunction of bladder, unspecified: Secondary | ICD-10-CM | POA: Diagnosis not present

## 2024-05-02 DIAGNOSIS — R5381 Other malaise: Secondary | ICD-10-CM | POA: Diagnosis not present

## 2024-05-02 DIAGNOSIS — L89154 Pressure ulcer of sacral region, stage 4: Secondary | ICD-10-CM | POA: Diagnosis not present

## 2024-05-03 DIAGNOSIS — R5381 Other malaise: Secondary | ICD-10-CM | POA: Diagnosis not present

## 2024-05-03 DIAGNOSIS — N319 Neuromuscular dysfunction of bladder, unspecified: Secondary | ICD-10-CM | POA: Diagnosis not present

## 2024-05-03 DIAGNOSIS — D649 Anemia, unspecified: Secondary | ICD-10-CM | POA: Diagnosis not present

## 2024-05-03 DIAGNOSIS — L89154 Pressure ulcer of sacral region, stage 4: Secondary | ICD-10-CM | POA: Diagnosis not present

## 2024-05-04 DIAGNOSIS — R5381 Other malaise: Secondary | ICD-10-CM | POA: Diagnosis not present

## 2024-05-04 DIAGNOSIS — D649 Anemia, unspecified: Secondary | ICD-10-CM | POA: Diagnosis not present

## 2024-05-04 DIAGNOSIS — N319 Neuromuscular dysfunction of bladder, unspecified: Secondary | ICD-10-CM | POA: Diagnosis not present

## 2024-05-05 DIAGNOSIS — N319 Neuromuscular dysfunction of bladder, unspecified: Secondary | ICD-10-CM | POA: Diagnosis not present

## 2024-05-05 DIAGNOSIS — R5381 Other malaise: Secondary | ICD-10-CM | POA: Diagnosis not present

## 2024-05-05 DIAGNOSIS — L89154 Pressure ulcer of sacral region, stage 4: Secondary | ICD-10-CM | POA: Diagnosis not present

## 2024-05-05 DIAGNOSIS — D649 Anemia, unspecified: Secondary | ICD-10-CM | POA: Diagnosis not present

## 2024-05-06 DIAGNOSIS — T17898A Other foreign object in other parts of respiratory tract causing other injury, initial encounter: Secondary | ICD-10-CM | POA: Diagnosis not present

## 2024-05-09 DIAGNOSIS — E878 Other disorders of electrolyte and fluid balance, not elsewhere classified: Secondary | ICD-10-CM | POA: Diagnosis not present

## 2024-05-09 DIAGNOSIS — R5381 Other malaise: Secondary | ICD-10-CM | POA: Diagnosis not present

## 2024-05-10 DIAGNOSIS — K429 Umbilical hernia without obstruction or gangrene: Secondary | ICD-10-CM | POA: Diagnosis not present

## 2024-05-10 DIAGNOSIS — M160 Bilateral primary osteoarthritis of hip: Secondary | ICD-10-CM | POA: Diagnosis not present

## 2024-05-10 DIAGNOSIS — J9811 Atelectasis: Secondary | ICD-10-CM | POA: Diagnosis not present

## 2024-05-10 DIAGNOSIS — R748 Abnormal levels of other serum enzymes: Secondary | ICD-10-CM | POA: Diagnosis not present

## 2024-05-10 DIAGNOSIS — J90 Pleural effusion, not elsewhere classified: Secondary | ICD-10-CM | POA: Diagnosis not present

## 2024-05-10 DIAGNOSIS — I700 Atherosclerosis of aorta: Secondary | ICD-10-CM | POA: Diagnosis not present

## 2024-05-10 DIAGNOSIS — R5381 Other malaise: Secondary | ICD-10-CM | POA: Diagnosis not present

## 2024-05-10 DIAGNOSIS — K435 Parastomal hernia without obstruction or  gangrene: Secondary | ICD-10-CM | POA: Diagnosis not present

## 2024-05-10 DIAGNOSIS — K828 Other specified diseases of gallbladder: Secondary | ICD-10-CM | POA: Diagnosis not present

## 2024-05-11 DIAGNOSIS — R5381 Other malaise: Secondary | ICD-10-CM | POA: Diagnosis not present

## 2024-05-11 DIAGNOSIS — E878 Other disorders of electrolyte and fluid balance, not elsewhere classified: Secondary | ICD-10-CM | POA: Diagnosis not present

## 2024-05-12 DIAGNOSIS — R5381 Other malaise: Secondary | ICD-10-CM | POA: Diagnosis not present

## 2024-05-16 DIAGNOSIS — G8220 Paraplegia, unspecified: Secondary | ICD-10-CM | POA: Diagnosis not present

## 2024-05-16 DIAGNOSIS — L89154 Pressure ulcer of sacral region, stage 4: Secondary | ICD-10-CM | POA: Diagnosis not present

## 2024-05-16 DIAGNOSIS — A419 Sepsis, unspecified organism: Secondary | ICD-10-CM | POA: Diagnosis not present

## 2024-05-17 DIAGNOSIS — L89154 Pressure ulcer of sacral region, stage 4: Secondary | ICD-10-CM | POA: Diagnosis not present

## 2024-05-17 DIAGNOSIS — A419 Sepsis, unspecified organism: Secondary | ICD-10-CM | POA: Diagnosis not present

## 2024-05-17 DIAGNOSIS — G8220 Paraplegia, unspecified: Secondary | ICD-10-CM | POA: Diagnosis not present

## 2024-05-18 DIAGNOSIS — A419 Sepsis, unspecified organism: Secondary | ICD-10-CM | POA: Diagnosis not present

## 2024-05-18 DIAGNOSIS — G8220 Paraplegia, unspecified: Secondary | ICD-10-CM | POA: Diagnosis not present

## 2024-05-18 DIAGNOSIS — L89154 Pressure ulcer of sacral region, stage 4: Secondary | ICD-10-CM | POA: Diagnosis not present

## 2024-05-19 DIAGNOSIS — G8220 Paraplegia, unspecified: Secondary | ICD-10-CM | POA: Diagnosis not present

## 2024-05-19 DIAGNOSIS — A419 Sepsis, unspecified organism: Secondary | ICD-10-CM | POA: Diagnosis not present

## 2024-05-19 DIAGNOSIS — L89154 Pressure ulcer of sacral region, stage 4: Secondary | ICD-10-CM | POA: Diagnosis not present

## 2024-05-23 ENCOUNTER — Ambulatory Visit: Admitting: Physician Assistant

## 2024-05-28 ENCOUNTER — Inpatient Hospital Stay
Admission: EM | Admit: 2024-05-28 | Discharge: 2024-06-13 | DRG: 673 | Disposition: A | Discharge status: Skilled Nursing Facility | Attending: Internal Medicine | Admitting: Internal Medicine

## 2024-05-28 ENCOUNTER — Emergency Department

## 2024-05-28 ENCOUNTER — Inpatient Hospital Stay

## 2024-05-28 DIAGNOSIS — J09X2 Influenza due to identified novel influenza A virus with other respiratory manifestations: Secondary | ICD-10-CM

## 2024-05-28 DIAGNOSIS — N390 Urinary tract infection, site not specified: Secondary | ICD-10-CM | POA: Diagnosis present

## 2024-05-28 DIAGNOSIS — Z66 Do not resuscitate: Secondary | ICD-10-CM | POA: Diagnosis present

## 2024-05-28 DIAGNOSIS — E43 Unspecified severe protein-calorie malnutrition: Secondary | ICD-10-CM | POA: Diagnosis present

## 2024-05-28 DIAGNOSIS — E8809 Other disorders of plasma-protein metabolism, not elsewhere classified: Secondary | ICD-10-CM | POA: Diagnosis present

## 2024-05-28 DIAGNOSIS — R7401 Elevation of levels of liver transaminase levels: Secondary | ICD-10-CM | POA: Diagnosis not present

## 2024-05-28 DIAGNOSIS — J09X9 Influenza due to identified novel influenza A virus with other manifestations: Secondary | ICD-10-CM | POA: Diagnosis not present

## 2024-05-28 DIAGNOSIS — A4151 Sepsis due to Escherichia coli [E. coli]: Secondary | ICD-10-CM | POA: Diagnosis not present

## 2024-05-28 DIAGNOSIS — L8940 Pressure ulcer of contiguous site of back, buttock and hip, unspecified stage: Secondary | ICD-10-CM | POA: Diagnosis not present

## 2024-05-28 DIAGNOSIS — E872 Acidosis, unspecified: Secondary | ICD-10-CM | POA: Diagnosis present

## 2024-05-28 DIAGNOSIS — E162 Hypoglycemia, unspecified: Secondary | ICD-10-CM

## 2024-05-28 DIAGNOSIS — L8944 Pressure ulcer of contiguous site of back, buttock and hip, stage 4: Secondary | ICD-10-CM | POA: Diagnosis not present

## 2024-05-28 DIAGNOSIS — Y846 Urinary catheterization as the cause of abnormal reaction of the patient, or of later complication, without mention of misadventure at the time of the procedure: Secondary | ICD-10-CM | POA: Diagnosis present

## 2024-05-28 DIAGNOSIS — J101 Influenza due to other identified influenza virus with other respiratory manifestations: Secondary | ICD-10-CM | POA: Diagnosis present

## 2024-05-28 DIAGNOSIS — R7989 Other specified abnormal findings of blood chemistry: Secondary | ICD-10-CM | POA: Diagnosis present

## 2024-05-28 DIAGNOSIS — M726 Necrotizing fasciitis: Secondary | ICD-10-CM | POA: Diagnosis not present

## 2024-05-28 DIAGNOSIS — Z803 Family history of malignant neoplasm of breast: Secondary | ICD-10-CM

## 2024-05-28 DIAGNOSIS — N319 Neuromuscular dysfunction of bladder, unspecified: Secondary | ICD-10-CM | POA: Diagnosis present

## 2024-05-28 DIAGNOSIS — T83511A Infection and inflammatory reaction due to indwelling urethral catheter, initial encounter: Secondary | ICD-10-CM | POA: Diagnosis present

## 2024-05-28 DIAGNOSIS — E861 Hypovolemia: Secondary | ICD-10-CM | POA: Diagnosis present

## 2024-05-28 DIAGNOSIS — E870 Hyperosmolality and hypernatremia: Secondary | ICD-10-CM | POA: Diagnosis present

## 2024-05-28 DIAGNOSIS — J9601 Acute respiratory failure with hypoxia: Secondary | ICD-10-CM | POA: Diagnosis present

## 2024-05-28 DIAGNOSIS — Z515 Encounter for palliative care: Secondary | ICD-10-CM

## 2024-05-28 DIAGNOSIS — Z87891 Personal history of nicotine dependence: Secondary | ICD-10-CM

## 2024-05-28 DIAGNOSIS — L89314 Pressure ulcer of right buttock, stage 4: Secondary | ICD-10-CM | POA: Diagnosis present

## 2024-05-28 DIAGNOSIS — G8220 Paraplegia, unspecified: Secondary | ICD-10-CM | POA: Diagnosis present

## 2024-05-28 DIAGNOSIS — A419 Sepsis, unspecified organism: Principal | ICD-10-CM | POA: Diagnosis present

## 2024-05-28 DIAGNOSIS — R7881 Bacteremia: Secondary | ICD-10-CM | POA: Diagnosis present

## 2024-05-28 DIAGNOSIS — L89159 Pressure ulcer of sacral region, unspecified stage: Secondary | ICD-10-CM | POA: Diagnosis not present

## 2024-05-28 DIAGNOSIS — I10 Essential (primary) hypertension: Secondary | ICD-10-CM | POA: Diagnosis present

## 2024-05-28 DIAGNOSIS — Z1621 Resistance to vancomycin: Secondary | ICD-10-CM | POA: Diagnosis present

## 2024-05-28 DIAGNOSIS — L89324 Pressure ulcer of left buttock, stage 4: Secondary | ICD-10-CM | POA: Diagnosis present

## 2024-05-28 DIAGNOSIS — E876 Hypokalemia: Secondary | ICD-10-CM | POA: Diagnosis present

## 2024-05-28 DIAGNOSIS — R6521 Severe sepsis with septic shock: Secondary | ICD-10-CM | POA: Diagnosis present

## 2024-05-28 DIAGNOSIS — Z993 Dependence on wheelchair: Secondary | ICD-10-CM

## 2024-05-28 DIAGNOSIS — E7220 Disorder of urea cycle metabolism, unspecified: Secondary | ICD-10-CM | POA: Diagnosis not present

## 2024-05-28 DIAGNOSIS — Z1624 Resistance to multiple antibiotics: Secondary | ICD-10-CM | POA: Diagnosis present

## 2024-05-28 DIAGNOSIS — D509 Iron deficiency anemia, unspecified: Secondary | ICD-10-CM | POA: Diagnosis present

## 2024-05-28 DIAGNOSIS — L89154 Pressure ulcer of sacral region, stage 4: Secondary | ICD-10-CM | POA: Diagnosis not present

## 2024-05-28 DIAGNOSIS — R1310 Dysphagia, unspecified: Secondary | ICD-10-CM | POA: Diagnosis present

## 2024-05-28 DIAGNOSIS — L89150 Pressure ulcer of sacral region, unstageable: Secondary | ICD-10-CM | POA: Diagnosis present

## 2024-05-28 DIAGNOSIS — A0472 Enterocolitis due to Clostridium difficile, not specified as recurrent: Secondary | ICD-10-CM | POA: Diagnosis present

## 2024-05-28 DIAGNOSIS — N400 Enlarged prostate without lower urinary tract symptoms: Secondary | ICD-10-CM | POA: Diagnosis present

## 2024-05-28 DIAGNOSIS — M4628 Osteomyelitis of vertebra, sacral and sacrococcygeal region: Secondary | ICD-10-CM | POA: Diagnosis not present

## 2024-05-28 DIAGNOSIS — F32A Depression, unspecified: Secondary | ICD-10-CM | POA: Diagnosis present

## 2024-05-28 DIAGNOSIS — Z89612 Acquired absence of left leg above knee: Secondary | ICD-10-CM

## 2024-05-28 DIAGNOSIS — Z89611 Acquired absence of right leg above knee: Secondary | ICD-10-CM

## 2024-05-28 DIAGNOSIS — D75839 Thrombocytosis, unspecified: Secondary | ICD-10-CM | POA: Diagnosis present

## 2024-05-28 DIAGNOSIS — D649 Anemia, unspecified: Secondary | ICD-10-CM | POA: Diagnosis not present

## 2024-05-28 DIAGNOSIS — A4150 Gram-negative sepsis, unspecified: Secondary | ICD-10-CM | POA: Diagnosis present

## 2024-05-28 DIAGNOSIS — Z1152 Encounter for screening for COVID-19: Secondary | ICD-10-CM

## 2024-05-28 DIAGNOSIS — Z7189 Other specified counseling: Secondary | ICD-10-CM | POA: Diagnosis not present

## 2024-05-28 DIAGNOSIS — Z6821 Body mass index (BMI) 21.0-21.9, adult: Secondary | ICD-10-CM

## 2024-05-28 DIAGNOSIS — I96 Gangrene, not elsewhere classified: Secondary | ICD-10-CM | POA: Diagnosis present

## 2024-05-28 DIAGNOSIS — M8669 Other chronic osteomyelitis, multiple sites: Secondary | ICD-10-CM | POA: Diagnosis present

## 2024-05-28 DIAGNOSIS — Z87440 Personal history of urinary (tract) infections: Secondary | ICD-10-CM

## 2024-05-28 DIAGNOSIS — J111 Influenza due to unidentified influenza virus with other respiratory manifestations: Secondary | ICD-10-CM | POA: Diagnosis not present

## 2024-05-28 DIAGNOSIS — B9620 Unspecified Escherichia coli [E. coli] as the cause of diseases classified elsewhere: Secondary | ICD-10-CM | POA: Diagnosis present

## 2024-05-28 DIAGNOSIS — Z79899 Other long term (current) drug therapy: Secondary | ICD-10-CM

## 2024-05-28 DIAGNOSIS — Z933 Colostomy status: Secondary | ICD-10-CM

## 2024-05-28 DIAGNOSIS — Z1612 Extended spectrum beta lactamase (ESBL) resistance: Secondary | ICD-10-CM | POA: Diagnosis not present

## 2024-05-28 LAB — CBC WITH DIFFERENTIAL/PLATELET
Abs Immature Granulocytes: 0.07 K/uL (ref 0.00–0.07)
Basophils Absolute: 0 K/uL (ref 0.0–0.1)
Basophils Relative: 0 %
Eosinophils Absolute: 0 K/uL (ref 0.0–0.5)
Eosinophils Relative: 0 %
HCT: 28.3 % — ABNORMAL LOW (ref 39.0–52.0)
Hemoglobin: 8.4 g/dL — ABNORMAL LOW (ref 13.0–17.0)
Immature Granulocytes: 1 %
Lymphocytes Relative: 22 %
Lymphs Abs: 2.8 K/uL (ref 0.7–4.0)
MCH: 23.2 pg — ABNORMAL LOW (ref 26.0–34.0)
MCHC: 29.7 g/dL — ABNORMAL LOW (ref 30.0–36.0)
MCV: 78.2 fL — ABNORMAL LOW (ref 80.0–100.0)
Monocytes Absolute: 0.8 K/uL (ref 0.1–1.0)
Monocytes Relative: 6 %
Neutro Abs: 9 K/uL — ABNORMAL HIGH (ref 1.7–7.7)
Neutrophils Relative %: 71 %
Platelets: 534 K/uL — ABNORMAL HIGH (ref 150–400)
RBC: 3.62 MIL/uL — ABNORMAL LOW (ref 4.22–5.81)
RDW: 23.2 % — ABNORMAL HIGH (ref 11.5–15.5)
Smear Review: NORMAL
WBC: 12.7 K/uL — ABNORMAL HIGH (ref 4.0–10.5)
nRBC: 0 % (ref 0.0–0.2)

## 2024-05-28 LAB — URINALYSIS, W/ REFLEX TO CULTURE (INFECTION SUSPECTED)
Bilirubin Urine: NEGATIVE
Glucose, UA: NEGATIVE mg/dL
Ketones, ur: 5 mg/dL — AB
Nitrite: NEGATIVE
Protein, ur: NEGATIVE mg/dL
RBC / HPF: 0 RBC/hpf (ref 0–5)
Specific Gravity, Urine: 1.005 (ref 1.005–1.030)
pH: 6 (ref 5.0–8.0)

## 2024-05-28 LAB — GLUCOSE, CAPILLARY
Glucose-Capillary: 76 mg/dL (ref 70–99)
Glucose-Capillary: 170 mg/dL — ABNORMAL HIGH (ref 70–99)

## 2024-05-28 LAB — COMPREHENSIVE METABOLIC PANEL WITH GFR
ALT: 15 U/L (ref 0–44)
AST: 56 U/L — ABNORMAL HIGH (ref 15–41)
Albumin: 1.9 g/dL — ABNORMAL LOW (ref 3.5–5.0)
Alkaline Phosphatase: 178 U/L — ABNORMAL HIGH (ref 38–126)
Anion gap: 14 (ref 5–15)
BUN: 13 mg/dL (ref 8–23)
CO2: 25 mmol/L (ref 22–32)
Calcium: 7.7 mg/dL — ABNORMAL LOW (ref 8.9–10.3)
Chloride: 99 mmol/L (ref 98–111)
Creatinine, Ser: 0.47 mg/dL — ABNORMAL LOW (ref 0.61–1.24)
GFR, Estimated: >60 mL/min
Glucose, Bld: 56 mg/dL — ABNORMAL LOW (ref 70–99)
Potassium: 3.7 mmol/L (ref 3.5–5.1)
Sodium: 138 mmol/L (ref 135–145)
Total Bilirubin: 0.5 mg/dL (ref 0.0–1.2)
Total Protein: 6.2 g/dL — ABNORMAL LOW (ref 6.5–8.1)

## 2024-05-28 LAB — PROCALCITONIN: Procalcitonin: 0.66 ng/mL

## 2024-05-28 LAB — MRSA NEXT GEN BY PCR, NASAL: MRSA by PCR Next Gen: DETECTED — AB

## 2024-05-28 LAB — PROTIME-INR
INR: 1.2 (ref 0.8–1.2)
Prothrombin Time: 15.7 s — ABNORMAL HIGH (ref 11.4–15.2)

## 2024-05-28 LAB — RESP PANEL BY RT-PCR (RSV, FLU A&B, COVID)  RVPGX2
Influenza A by PCR: POSITIVE — AB
Influenza B by PCR: NEGATIVE
Resp Syncytial Virus by PCR: NEGATIVE
SARS Coronavirus 2 by RT PCR: NEGATIVE

## 2024-05-28 LAB — MAGNESIUM: Magnesium: 1.8 mg/dL (ref 1.7–2.4)

## 2024-05-28 LAB — LACTIC ACID, PLASMA
Lactic Acid, Venous: 2.8 mmol/L (ref 0.5–1.9)
Lactic Acid, Venous: 2.1 mmol/L (ref 0.5–1.9)

## 2024-05-28 LAB — CBG MONITORING, ED: Glucose-Capillary: 216 mg/dL — ABNORMAL HIGH (ref 70–99)

## 2024-05-28 MED ORDER — LACTATED RINGERS IV BOLUS (SEPSIS)
1000.0000 mL | Freq: Once | INTRAVENOUS | Status: AC
  Administered 2024-05-28: 1000 mL via INTRAVENOUS

## 2024-05-28 MED ORDER — IOHEXOL 300 MG/ML  SOLN
100.0000 mL | Freq: Once | INTRAMUSCULAR | Status: AC | PRN
  Administered 2024-05-28: 100 mL via INTRAVENOUS

## 2024-05-28 MED ORDER — PIPERACILLIN-TAZOBACTAM 3.375 G IVPB
3.3750 g | Freq: Three times a day (TID) | INTRAVENOUS | Status: DC
  Administered 2024-05-28 – 2024-05-29 (×2): 3.375 g via INTRAVENOUS
  Filled 2024-05-28 (×2): qty 50

## 2024-05-28 MED ORDER — CHLORHEXIDINE GLUCONATE CLOTH 2 % EX PADS
6.0000 | MEDICATED_PAD | Freq: Every day | CUTANEOUS | Status: DC
  Administered 2024-05-28 – 2024-06-13 (×16): 6 via TOPICAL
  Filled 2024-05-28: qty 6

## 2024-05-28 MED ORDER — LINEZOLID 600 MG/300ML IV SOLN
600.0000 mg | Freq: Two times a day (BID) | INTRAVENOUS | Status: DC
  Administered 2024-05-28 – 2024-05-31 (×6): 600 mg via INTRAVENOUS
  Filled 2024-05-28 (×6): qty 300

## 2024-05-28 MED ORDER — MIDODRINE HCL 5 MG PO TABS: 10.0000 mg | ORAL_TABLET | Freq: Three times a day (TID) | ORAL | Status: DC

## 2024-05-28 MED ORDER — LACTATED RINGERS IV BOLUS (SEPSIS)
250.0000 mL | Freq: Once | INTRAVENOUS | Status: AC
  Administered 2024-05-28: 250 mL via INTRAVENOUS

## 2024-05-28 MED ORDER — LACTATED RINGERS IV SOLN: INTRAVENOUS | Status: AC

## 2024-05-28 MED ORDER — ENOXAPARIN SODIUM 40 MG/0.4ML IJ SOSY
40.0000 mg | PREFILLED_SYRINGE | Freq: Every day | INTRAMUSCULAR | Status: DC
  Administered 2024-05-28 – 2024-05-30 (×3): 40 mg via SUBCUTANEOUS
  Filled 2024-05-28 (×4): qty 0.4

## 2024-05-28 MED ORDER — ZINC SULFATE 220 (50 ZN) MG PO CAPS
220.0000 mg | ORAL_CAPSULE | Freq: Every day | ORAL | Status: DC
  Administered 2024-05-29 – 2024-06-07 (×4): 220 mg via ORAL
  Filled 2024-05-28 (×3): qty 1

## 2024-05-28 MED ORDER — NOREPINEPHRINE 4 MG/250ML-% IV SOLN
0.0000 ug/min | INTRAVENOUS | Status: DC
  Administered 2024-05-28: 5 ug/min via INTRAVENOUS
  Administered 2024-05-29: 6 ug/min via INTRAVENOUS
  Administered 2024-05-29: 9 ug/min via INTRAVENOUS
  Administered 2024-05-30: 4 ug/min via INTRAVENOUS
  Filled 2024-05-28 (×5): qty 250

## 2024-05-28 MED ORDER — DOCUSATE SODIUM 100 MG PO CAPS: 100.0000 mg | ORAL_CAPSULE | Freq: Two times a day (BID) | ORAL | Status: DC | PRN

## 2024-05-28 MED ORDER — ENSURE PLUS HIGH PROTEIN PO LIQD
237.0000 mL | Freq: Three times a day (TID) | ORAL | Status: DC
  Administered 2024-05-29 – 2024-06-10 (×16): 237 mL via ORAL

## 2024-05-28 MED ORDER — SODIUM CHLORIDE 0.9 % IV SOLN
2.0000 g | Freq: Once | INTRAVENOUS | Status: AC
  Administered 2024-05-28: 2 g via INTRAVENOUS
  Filled 2024-05-28: qty 12.5

## 2024-05-28 MED ORDER — FLUCONAZOLE IN SODIUM CHLORIDE 400-0.9 MG/200ML-% IV SOLN
400.0000 mg | INTRAVENOUS | Status: DC
  Administered 2024-05-29: 400 mg via INTRAVENOUS
  Filled 2024-05-28: qty 200

## 2024-05-28 MED ORDER — METRONIDAZOLE 500 MG/100ML IV SOLN
500.0000 mg | Freq: Once | INTRAVENOUS | Status: AC
  Administered 2024-05-28: 500 mg via INTRAVENOUS
  Filled 2024-05-28: qty 100

## 2024-05-28 MED ORDER — PANTOPRAZOLE SODIUM 40 MG PO TBEC
40.0000 mg | DELAYED_RELEASE_TABLET | Freq: Every day | ORAL | Status: DC
  Administered 2024-05-29 – 2024-06-02 (×3): 40 mg via ORAL
  Filled 2024-05-28 (×4): qty 1

## 2024-05-28 MED ORDER — DEXTROSE 50 % IV SOLN
50.0000 mL | INTRAVENOUS | Status: DC | PRN
  Administered 2024-06-01 – 2024-06-13 (×3): 50 mL via INTRAVENOUS
  Filled 2024-05-28 (×3): qty 50

## 2024-05-28 MED ORDER — ACETAMINOPHEN 325 MG PO TABS
650.0000 mg | ORAL_TABLET | Freq: Four times a day (QID) | ORAL | Status: DC | PRN
  Administered 2024-05-29 – 2024-06-03 (×2): 650 mg via ORAL
  Filled 2024-05-28: qty 2

## 2024-05-28 MED ORDER — VITAMIN C 500 MG PO TABS
500.0000 mg | ORAL_TABLET | Freq: Two times a day (BID) | ORAL | Status: DC
  Administered 2024-05-28 – 2024-06-13 (×21): 500 mg via ORAL
  Filled 2024-05-28 (×14): qty 1

## 2024-05-28 MED ORDER — MUPIROCIN 2 % EX OINT
1.0000 | TOPICAL_OINTMENT | Freq: Two times a day (BID) | CUTANEOUS | Status: AC
  Administered 2024-05-29 – 2024-06-02 (×8): 1 via NASAL
  Filled 2024-05-28: qty 22

## 2024-05-28 MED ORDER — POLYETHYLENE GLYCOL 3350 17 G PO PACK: 17.0000 g | PACK | Freq: Every day | ORAL | Status: DC | PRN

## 2024-05-28 MED ORDER — VANCOMYCIN HCL IN DEXTROSE 1-5 GM/200ML-% IV SOLN
1000.0000 mg | Freq: Once | INTRAVENOUS | Status: AC
  Administered 2024-05-28: 1000 mg via INTRAVENOUS
  Filled 2024-05-28: qty 200

## 2024-05-28 MED ORDER — ALBUMIN HUMAN 25 % IV SOLN
25.0000 g | Freq: Four times a day (QID) | INTRAVENOUS | Status: AC
  Administered 2024-05-28 – 2024-05-29 (×4): 25 g via INTRAVENOUS
  Filled 2024-05-28 (×4): qty 100

## 2024-05-28 NOTE — ED Notes (Signed)
 This RN collected $1300 from patient with security. Pt agreeable for security to collect at this time.

## 2024-05-28 NOTE — Sepsis Progress Note (Signed)
 Sepsis protocol monitored by eLink

## 2024-05-28 NOTE — ED Notes (Signed)
 This RN and sydney, NT changed pt linens and provided pericare. New foley placed per MD.

## 2024-05-28 NOTE — Progress Notes (Signed)
 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  Vital signs within normal limits supplemented by norepinephrine  infusion.  Results consistent with leukocytosis, thrombocytosis, hypoglycemia and lactic acidosis.  Positive for influenza with grossly positive urinalysis and unremarkable chest radiograph.  Extensive culture positivity with MDR organisms.  Exam consistent with chronic sacral wound, unstageable.    eICU Interventions  CT abdomen/Pelvis pending   POCT glucose monitoring, hypoglycemia protocol  Empiric abx - zosyn , linezolid , and fluconazole   Crystalloid infusion, Norepi for map goal >65  DVT prophylaxis with lovenox  GI prophylaxis with home PPI     Intervention Category Evaluation Type: New Patient Evaluation  Bevan Disney 05/28/2024, 10:13 PM

## 2024-05-28 NOTE — Progress Notes (Addendum)
 PHARMACY CONSULT NOTE - ELECTROLYTES  Pharmacy Consult for Electrolyte Monitoring and Replacement   Recent Labs: Height: 6' 2 (188 cm) Weight: 69.7 kg (153 lb 9.6 oz) IBW/kg (Calculated) : 82.2 Estimated Creatinine Clearance: 72.6 mL/min (A) (by C-G formula based on SCr of 0.47 mg/dL (L)). Potassium (mmol/L)  Date Value  05/28/2024 3.7  05/16/2014 3.1 (L)   Magnesium  (mg/dL)  Date Value  88/95/7974 1.9  06/17/2011 2.3   Calcium (mg/dL)  Date Value  87/76/7974 7.7 (L)   Calcium, Total (mg/dL)  Date Value  87/88/7984 7.9 (L)   Albumin  (g/dL)  Date Value  87/76/7974 1.9 (L)  05/13/2014 2.5 (L)   Phosphorus (mg/dL)  Date Value  89/72/7974 2.7   Sodium (mmol/L)  Date Value  05/28/2024 138  05/16/2014 142   Corrected Ca: 9 mg/dL  Assessment  Bradley [name] is a 78 y.o. male presenting with influenza A and pressure injury to buttocks. PMH significant for recurrent UTI, recent right AKA, previous left AKA, chronic anemia, hypertension, stage IV sacral decubitus ulcer. Pharmacy has been consulted to monitor and replace electrolytes.  Diet: NPO MIVF: LR @ 150 mL/hr Pertinent medications: None   Goal of Therapy: Electrolytes WNL  12/23: K 3.7  Plan:  No electrolyte replacement warranted at this time  Check BMP, Mg, Phos with AM labs  Thank you for allowing pharmacy to be a part of this patient's care.   Ransom Blanch PGY-1 Pharmacy Resident  [place] [name]  05/28/2024 7:45 PM

## 2024-05-28 NOTE — Progress Notes (Signed)
 05/28/2024- 4 min

## 2024-05-28 NOTE — ED Provider Notes (Signed)
 "  [facility] Provider Note    Event Date/Time   First MD Initiated Contact with Patient 05/28/24 1554     (approximate)   History   Shortness of Breath   HPI  Bradley [name] is a 78 y.o. male with a past medical history of recurrent UTI, recent right AKA, previous left AKA, chronic anemia, hypertension, stage IV sacral decubitus ulcer, presenting to the emergency department via EMS for concerns for sepsis.  Patient reports that he called the ambulance because his home health nurse advised him to.  He was noted by EMS to have an oxygen  saturation of 70% on room air.  He does not normally require oxygen .  They also noted the wound to his sacral area is bleeding and that he had a strong smell to his urine.     Physical Exam   Triage Vital Signs: ED Triage Vitals  Encounter Vitals Group     BP      Girls Systolic BP Percentile      Girls Diastolic BP Percentile      Boys Systolic BP Percentile      Boys Diastolic BP Percentile      Pulse      Resp      Temp      Temp src      SpO2      Weight      Height      Head Circumference      Peak Flow      Pain Score      Pain Loc      Pain Education      Exclude from Growth Chart     Most recent vital signs: There were no vitals filed for this visit.   General: Awake, no distress.  CV:  Good peripheral perfusion.  Resp:  Normal effort.  Abd:  No distention.  Other:  Bilateral AKA, well-healing right AKA surgical incision; extensive stage IV decubitus ulcer present extending from the lower back through the buttocks, foul smell, no purulent drainage noted   ED Results / Procedures / Treatments   Labs (all labs ordered are listed, but only abnormal results are displayed) Labs Reviewed - No data to display   EKG  ED ECG REPORT I, Reche CHRISTELLA Leventhal, the attending physician, personally viewed and interpreted this ECG.  Date: 05/28/2024 Rate: 137bpm Rhythm: Atrial fibrillation QRS Axis:  Left axis deviation Intervals: normal ST/T Wave abnormalities: normal Narrative Interpretation: no evidence of acute ischemia    RADIOLOGY Patient's chest x-ray was independently viewed and interpreted by myself as no acute pathology.  Radiology interpretation in agreement.    PROCEDURES:  Critical Care performed:   CRITICAL CARE Performed by: Reche CHRISTELLA Leventhal   Total critical care time: 30 minutes  Critical care time was exclusive of separately billable procedures and treating other patients.  Critical care was necessary to treat or prevent imminent or life-threatening deterioration.  Critical care was time spent personally by me on the following activities: development of treatment plan with patient and/or surrogate as well as nursing, discussions with consultants, evaluation of patient's response to treatment, examination of patient, obtaining history from patient or surrogate, ordering and performing treatments and interventions, ordering and review of laboratory studies, ordering and review of radiographic studies, pulse oximetry and re-evaluation of patient's condition.   Procedures   MEDICATIONS ORDERED IN ED: Medications - No data to display   IMPRESSION / MDM / ASSESSMENT AND PLAN /  ED COURSE  I reviewed the triage vital signs and the nursing notes.                              Differential diagnosis includes, but is not limited to, sepsis, UTI, wound infection, anemia, pneumonia, pleural effusions  Patient's presentation is most consistent with acute presentation with potential threat to life or bodily function.  Patient is an 78 year old male with past medical history of stage IV sacral decubitus ulcer, sepsis, recurrent UTIs, anemia, bilateral BKA's, presenting to the emergency department for concerns for sepsis.  Upon arrival to the emergency department the patient was treated with sepsis protocol including IV antibiotics, blood culture collection, and  broad-spectrum antibiotics.  On workup the patient was noted to have findings concerning for a UTI.  He is also influenza positive.  While the blood patient's blood pressure initially improved with IV fluids after 2 L he became hypotensive again with a systolic blood pressure in the 80s.  Decision was made to start the patient on Levophed .  I had initially contacted the hospitalist for admission however given his worsening hypotension the patient will be placed in the ICU.  I discussed the patient's case with the ICU provider who is in agreement with plan for admission at this time.      FINAL CLINICAL IMPRESSION(S) / ED DIAGNOSES   Final diagnoses:  Sepsis with acute organ dysfunction, due to unspecified organism, unspecified organ dysfunction type, unspecified whether septic shock present (HCC)  Urinary tract infection associated with indwelling urethral catheter, initial encounter  Pressure injury of contiguous region involving back and hip, stage 4, unspecified laterality (HCC)     Rx / DC Orders   ED Discharge Orders     None        Note:  This document was prepared using Dragon voice recognition software and may include unintentional dictation errors.   [place] Reche HERO, MD 05/28/24 2204  "

## 2024-05-28 NOTE — Progress Notes (Signed)
 CODE SEPSIS - PHARMACY COMMUNICATION  **Broad Spectrum Antibiotics should be administered within 1 hour of Sepsis diagnosis**  Time Code Sepsis Called/Page Received: 16:12  Antibiotics Ordered: Cefepime , vancomycin , flagyl   Time of 1st antibiotic administration: 16:47  Additional action taken by pharmacy: N/A  If necessary, Name of Provider/Nurse Contacted: N/A   Ransom Blanch PGY-1 Pharmacy Resident  [place] [facility]  05/28/2024 4:21 PM
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
## 2024-05-28 NOTE — Progress Notes (Signed)
 Pharmacy Antibiotic Note  Bradley [name] is a 78 y.o. male admitted on 05/28/2024 with sepsis/hx chronic osteomyelitis, wound infection?, Hx MDRO, candida aureus empiric coverage- sepsis (recent infection 04/16/24). Pharmacy has been consulted for zosyn  and fluconazole  dosing. -influenza A + -ID consulted  - Pateint with recent [facility] admission 10/21-11/5/25: was discharged on Unasyn  with anticipated transitition to Augmentin  per ID note 04/10/24 - Hx of bacteremia with VRE, Proteus, clostridium and UCxs with Providencia, Acinetobacter, Pseudomonas, E.coli, MRSA -Unable to find information on the candida results in online chart  Plan: Will order Zosyn  EI 3.375 gm IV q8h  Will order Fluconazole  400 mg IV q24h   Also on Linezolid  600mg  IV q12h  Continue to assess renal fxn, cultures, and length of therapy, etc  Height: 6' 2 (188 cm) Weight: 69.7 kg (153 lb 9.6 oz) IBW/kg (Calculated) : 82.2  Temp (24hrs), Avg:98.4 F (36.9 C), Min:97.5 F (36.4 C), Max:99.2 F (37.3 C)  Recent Labs  Lab 05/28/24 1612 05/28/24 1815  WBC 12.7*  --   CREATININE 0.47*  --   LATICACIDVEN 2.8* 2.1*    Estimated Creatinine Clearance: 72.6 mL/min (A) (by C-G formula based on SCr of 0.47 mg/dL (L)).    Allergies[1]  Antimicrobials this admission: Cefepime /Vancomycin /Metronidazole  x 1 each 12/23  Linezolid  12/23 >>   Zosyn  12/23 >> Fluconazole  12/24>>  Dose adjustments this admission:    Microbiology results: 12/23 BCx: pending 12/23 UCx: pending    Sputum:    12/23 MRSA PCR: pending 12/23 wound cx: pending  Thank you for allowing pharmacy to be a part of this patients care.  Allean Haas PharmD Clinical Pharmacist 05/28/2024      [1]  Allergies Allergen Reactions   Lisinopril Cough

## 2024-05-28 NOTE — ED Triage Notes (Signed)
 Pt to ED via [facility] from home c/o difficulty breathing. Pt was 70% O2 RA. Placed on 4L by EMS 97%. Pt recently had an amputation to the right leg. EMS reports foul smell from foley bag. Pt also has a pressure injury to buttocks. Pt slow to respond during triage.   87/46 128 HR 97% 4L [place] RR 28
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
## 2024-05-29 ENCOUNTER — Other Ambulatory Visit: Payer: Self-pay

## 2024-05-29 DIAGNOSIS — N390 Urinary tract infection, site not specified: Secondary | ICD-10-CM

## 2024-05-29 DIAGNOSIS — N319 Neuromuscular dysfunction of bladder, unspecified: Secondary | ICD-10-CM

## 2024-05-29 DIAGNOSIS — L89154 Pressure ulcer of sacral region, stage 4: Secondary | ICD-10-CM

## 2024-05-29 DIAGNOSIS — Z515 Encounter for palliative care: Secondary | ICD-10-CM

## 2024-05-29 LAB — COMPREHENSIVE METABOLIC PANEL WITH GFR
ALT: 9 U/L (ref 0–44)
AST: 28 U/L (ref 15–41)
Albumin: 2 g/dL — ABNORMAL LOW (ref 3.5–5.0)
Alkaline Phosphatase: 119 U/L (ref 38–126)
Anion gap: 11 (ref 5–15)
BUN: 9 mg/dL (ref 8–23)
CO2: 26 mmol/L (ref 22–32)
Calcium: 7.5 mg/dL — ABNORMAL LOW (ref 8.9–10.3)
Chloride: 105 mmol/L (ref 98–111)
Creatinine, Ser: 0.35 mg/dL — ABNORMAL LOW (ref 0.61–1.24)
GFR, Estimated: >60 mL/min
Glucose, Bld: 167 mg/dL — ABNORMAL HIGH (ref 70–99)
Potassium: 2.8 mmol/L — ABNORMAL LOW (ref 3.5–5.1)
Sodium: 142 mmol/L (ref 135–145)
Total Bilirubin: 0.7 mg/dL (ref 0.0–1.2)
Total Protein: 4.8 g/dL — ABNORMAL LOW (ref 6.5–8.1)

## 2024-05-29 LAB — PHOSPHORUS
Phosphorus: 2 mg/dL — ABNORMAL LOW (ref 2.5–4.6)
Phosphorus: 2.6 mg/dL (ref 2.5–4.6)

## 2024-05-29 LAB — BLOOD CULTURE ID PANEL (REFLEXED) - BCID2
A.calcoaceticus-baumannii: NOT DETECTED
Bacteroides fragilis: NOT DETECTED
CTX-M ESBL: DETECTED — AB
Candida albicans: NOT DETECTED
Candida auris: NOT DETECTED
Candida glabrata: NOT DETECTED
Candida krusei: NOT DETECTED
Candida parapsilosis: NOT DETECTED
Candida tropicalis: NOT DETECTED
Carbapenem resist OXA 48 LIKE: NOT DETECTED
Carbapenem resistance IMP: NOT DETECTED
Carbapenem resistance KPC: NOT DETECTED
Carbapenem resistance NDM: NOT DETECTED
Carbapenem resistance VIM: NOT DETECTED
Cryptococcus neoformans/gattii: NOT DETECTED
Enterobacter cloacae complex: NOT DETECTED
Enterobacterales: DETECTED — AB
Enterococcus Faecium: NOT DETECTED
Enterococcus faecalis: NOT DETECTED
Escherichia coli: DETECTED — AB
Haemophilus influenzae: NOT DETECTED
Klebsiella aerogenes: NOT DETECTED
Klebsiella oxytoca: NOT DETECTED
Klebsiella pneumoniae: NOT DETECTED
Listeria monocytogenes: NOT DETECTED
Neisseria meningitidis: NOT DETECTED
Proteus species: NOT DETECTED
Pseudomonas aeruginosa: NOT DETECTED
Salmonella species: NOT DETECTED
Serratia marcescens: NOT DETECTED
Staphylococcus aureus (BCID): NOT DETECTED
Staphylococcus epidermidis: NOT DETECTED
Staphylococcus lugdunensis: NOT DETECTED
Staphylococcus species: NOT DETECTED
Stenotrophomonas maltophilia: NOT DETECTED
Streptococcus agalactiae: NOT DETECTED
Streptococcus pneumoniae: NOT DETECTED
Streptococcus pyogenes: NOT DETECTED
Streptococcus species: NOT DETECTED

## 2024-05-29 LAB — BASIC METABOLIC PANEL WITH GFR
Anion gap: 12 (ref 5–15)
BUN: 6 mg/dL — ABNORMAL LOW (ref 8–23)
CO2: 28 mmol/L (ref 22–32)
Calcium: 7.8 mg/dL — ABNORMAL LOW (ref 8.9–10.3)
Chloride: 109 mmol/L (ref 98–111)
Creatinine, Ser: 0.37 mg/dL — ABNORMAL LOW (ref 0.61–1.24)
GFR, Estimated: >60 mL/min
Glucose, Bld: 144 mg/dL — ABNORMAL HIGH (ref 70–99)
Potassium: 2.8 mmol/L — ABNORMAL LOW (ref 3.5–5.1)
Sodium: 150 mmol/L — ABNORMAL HIGH (ref 135–145)

## 2024-05-29 LAB — GLUCOSE, CAPILLARY
Glucose-Capillary: 157 mg/dL — ABNORMAL HIGH (ref 70–99)
Glucose-Capillary: 158 mg/dL — ABNORMAL HIGH (ref 70–99)
Glucose-Capillary: 159 mg/dL — ABNORMAL HIGH (ref 70–99)
Glucose-Capillary: 163 mg/dL — ABNORMAL HIGH (ref 70–99)
Glucose-Capillary: 185 mg/dL — ABNORMAL HIGH (ref 70–99)
Glucose-Capillary: 200 mg/dL — ABNORMAL HIGH (ref 70–99)

## 2024-05-29 LAB — MAGNESIUM: Magnesium: 1.8 mg/dL (ref 1.7–2.4)

## 2024-05-29 LAB — HEMOGLOBIN AND HEMATOCRIT, BLOOD
HCT: 25.2 % — ABNORMAL LOW (ref 39.0–52.0)
Hemoglobin: 8 g/dL — ABNORMAL LOW (ref 13.0–17.0)

## 2024-05-29 LAB — LACTIC ACID, PLASMA: Lactic Acid, Venous: 1 mmol/L (ref 0.5–1.9)

## 2024-05-29 LAB — CBC
HCT: 19.2 % — ABNORMAL LOW (ref 39.0–52.0)
Hemoglobin: 6 g/dL — ABNORMAL LOW (ref 13.0–17.0)
MCH: 23.6 pg — ABNORMAL LOW (ref 26.0–34.0)
MCHC: 31.3 g/dL (ref 30.0–36.0)
MCV: 75.6 fL — ABNORMAL LOW (ref 80.0–100.0)
Platelets: 339 K/uL (ref 150–400)
RBC: 2.54 MIL/uL — ABNORMAL LOW (ref 4.22–5.81)
RDW: 22.9 % — ABNORMAL HIGH (ref 11.5–15.5)
WBC: 8.7 K/uL (ref 4.0–10.5)
nRBC: 0 % (ref 0.0–0.2)

## 2024-05-29 LAB — PREPARE RBC (CROSSMATCH)

## 2024-05-29 LAB — POTASSIUM: Potassium: 3 mmol/L — ABNORMAL LOW (ref 3.5–5.1)

## 2024-05-29 MED ORDER — SODIUM CHLORIDE 0.9% IV SOLUTION: Freq: Once | INTRAVENOUS | Status: AC

## 2024-05-29 MED ORDER — SODIUM CHLORIDE 0.9 % IV SOLN
1.0000 g | Freq: Three times a day (TID) | INTRAVENOUS | Status: AC
  Administered 2024-05-29 – 2024-06-11 (×40): 1 g via INTRAVENOUS
  Filled 2024-05-29 (×20): qty 20

## 2024-05-29 MED ORDER — SODIUM CHLORIDE 0.9 % IV SOLN
100.0000 mg | INTRAVENOUS | Status: DC
  Administered 2024-05-29 – 2024-05-30 (×2): 100 mg via INTRAVENOUS
  Filled 2024-05-29 (×2): qty 5

## 2024-05-29 MED ORDER — ORAL CARE MOUTH RINSE: 15.0000 mL | OROMUCOSAL | Status: DC | PRN

## 2024-05-29 MED ORDER — SODIUM CHLORIDE 0.9 % IV SOLN
1.0000 g | Freq: Once | INTRAVENOUS | Status: AC
  Administered 2024-05-29: 1 g via INTRAVENOUS
  Filled 2024-05-29: qty 20

## 2024-05-29 MED ORDER — POTASSIUM & SODIUM PHOSPHATES 280-160-250 MG PO PACK
2.0000 | PACK | ORAL | Status: AC
  Administered 2024-05-29 (×2): 2 via ORAL
  Filled 2024-05-29: qty 2
  Filled 2024-05-29: qty 1
  Filled 2024-05-29: qty 2

## 2024-05-29 MED ORDER — MORPHINE SULFATE (PF) 2 MG/ML IV SOLN
2.0000 mg | INTRAVENOUS | Status: DC | PRN
  Administered 2024-05-29 (×2): 2 mg via INTRAVENOUS
  Filled 2024-05-29 (×2): qty 1

## 2024-05-29 MED ORDER — PROSIGHT PO TABS
1.0000 | ORAL_TABLET | Freq: Every day | ORAL | Status: DC
  Administered 2024-05-30 – 2024-06-07 (×3): 1 via ORAL
  Filled 2024-05-29 (×4): qty 1

## 2024-05-29 MED ORDER — MAGNESIUM SULFATE 2 GM/50ML IV SOLN
2.0000 g | Freq: Once | INTRAVENOUS | Status: AC
  Administered 2024-05-29: 2 g via INTRAVENOUS
  Filled 2024-05-29: qty 50

## 2024-05-29 MED ORDER — OSELTAMIVIR PHOSPHATE 75 MG PO CAPS
75.0000 mg | ORAL_CAPSULE | Freq: Two times a day (BID) | ORAL | Status: AC
  Administered 2024-05-29 – 2024-06-02 (×5): 75 mg via ORAL
  Filled 2024-05-29 (×10): qty 1

## 2024-05-29 MED ORDER — MIDODRINE HCL 5 MG PO TABS
10.0000 mg | ORAL_TABLET | Freq: Three times a day (TID) | ORAL | Status: DC
  Administered 2024-05-29 – 2024-05-30 (×2): 10 mg via ORAL
  Filled 2024-05-29 (×3): qty 2

## 2024-05-29 MED ORDER — POTASSIUM CHLORIDE CRYS ER 20 MEQ PO TBCR
40.0000 meq | EXTENDED_RELEASE_TABLET | Freq: Once | ORAL | Status: DC
  Administered 2024-05-29: 40 meq via ORAL
  Filled 2024-05-29: qty 2

## 2024-05-29 MED ORDER — SODIUM CHLORIDE 0.9% FLUSH
10.0000 mL | Freq: Two times a day (BID) | INTRAVENOUS | Status: DC
  Administered 2024-05-29: 40 mL
  Administered 2024-05-29: 30 mL
  Administered 2024-05-30: 40 mL
  Administered 2024-05-30: 10 mL
  Administered 2024-05-31: 30 mL
  Administered 2024-05-31: 10 mL
  Administered 2024-06-01: 30 mL
  Administered 2024-06-01 – 2024-06-02 (×3): 10 mL
  Administered 2024-06-03: 20 mL
  Administered 2024-06-03: 30 mL
  Administered 2024-06-04 (×2): 10 mL
  Administered 2024-06-05: 30 mL
  Administered 2024-06-05: 40 mL
  Administered 2024-06-06 – 2024-06-08 (×5): 10 mL
  Administered 2024-06-08: 30 mL
  Administered 2024-06-09 – 2024-06-11 (×6): 10 mL
  Administered 2024-06-12: 20 mL
  Administered 2024-06-13: 10 mL

## 2024-05-29 MED ORDER — SODIUM CHLORIDE 0.9% FLUSH: 10.0000 mL | INTRAVENOUS | Status: DC | PRN

## 2024-05-29 MED ORDER — POTASSIUM CHLORIDE CRYS ER 20 MEQ PO TBCR
40.0000 meq | EXTENDED_RELEASE_TABLET | Freq: Once | ORAL | Status: AC
  Administered 2024-05-29: 40 meq via ORAL
  Filled 2024-05-29: qty 2

## 2024-05-29 NOTE — Progress Notes (Signed)
 PHARMACY - PHYSICIAN COMMUNICATION CRITICAL VALUE ALERT - BLOOD CULTURE IDENTIFICATION (BCID)  Bradley [name] is an 78 y.o. male who presented to [facility] on 05/28/2024 with a chief complaint of sepsis  Assessment: 1/4 bottles GNR. BCID detected E coli with CTX-M gene. Suspect sacral wound as source.  Name of physician (or Provider) Contacted: Dr. Loney Stank & Dr. Belva November  Current antibiotics: Linezolid  & Zosyn   Changes to prescribed antibiotics recommended:  Broaden Zosyn  to meropenem  for ESBL bacteremia  Results for orders placed or performed during the hospital encounter of 05/28/24  Blood Culture ID Panel (Reflexed) (Collected: 05/28/2024  4:12 PM)  Result Value Ref Range   Enterococcus faecalis NOT DETECTED NOT DETECTED   Enterococcus Faecium NOT DETECTED NOT DETECTED   Listeria monocytogenes NOT DETECTED NOT DETECTED   Staphylococcus species NOT DETECTED NOT DETECTED   Staphylococcus aureus (BCID) NOT DETECTED NOT DETECTED   Staphylococcus epidermidis NOT DETECTED NOT DETECTED   Staphylococcus lugdunensis NOT DETECTED NOT DETECTED   Streptococcus species NOT DETECTED NOT DETECTED   Streptococcus agalactiae NOT DETECTED NOT DETECTED   Streptococcus pneumoniae NOT DETECTED NOT DETECTED   Streptococcus pyogenes NOT DETECTED NOT DETECTED   A.calcoaceticus-baumannii NOT DETECTED NOT DETECTED   Bacteroides fragilis NOT DETECTED NOT DETECTED   Enterobacterales DETECTED (A) NOT DETECTED   Enterobacter cloacae complex NOT DETECTED NOT DETECTED   Escherichia coli DETECTED (A) NOT DETECTED   Klebsiella aerogenes NOT DETECTED NOT DETECTED   Klebsiella oxytoca NOT DETECTED NOT DETECTED   Klebsiella pneumoniae NOT DETECTED NOT DETECTED   Proteus species NOT DETECTED NOT DETECTED   Salmonella species NOT DETECTED NOT DETECTED   Serratia marcescens NOT DETECTED NOT DETECTED   Haemophilus influenzae NOT DETECTED NOT DETECTED   Neisseria meningitidis NOT DETECTED NOT DETECTED    Pseudomonas aeruginosa NOT DETECTED NOT DETECTED   Stenotrophomonas maltophilia NOT DETECTED NOT DETECTED   Candida albicans NOT DETECTED NOT DETECTED   Candida auris NOT DETECTED NOT DETECTED   Candida glabrata NOT DETECTED NOT DETECTED   Candida krusei NOT DETECTED NOT DETECTED   Candida parapsilosis NOT DETECTED NOT DETECTED   Candida tropicalis NOT DETECTED NOT DETECTED   Cryptococcus neoformans/gattii NOT DETECTED NOT DETECTED   CTX-M ESBL DETECTED (A) NOT DETECTED   Carbapenem resistance IMP NOT DETECTED NOT DETECTED   Carbapenem resistance KPC NOT DETECTED NOT DETECTED   Carbapenem resistance NDM NOT DETECTED NOT DETECTED   Carbapenem resist OXA 48 LIKE NOT DETECTED NOT DETECTED   Carbapenem resistance VIM NOT DETECTED NOT DETECTED   [name] 05/29/2024  11:07 AM

## 2024-05-29 NOTE — IPAL (Signed)
" °  Interdisciplinary Goals of Care Family Meeting   Date carried out: 05/29/2024  Location of the meeting: Bedside  Member's involved: Physician and Other: Patient himself  Durable Power of Attorney or environmental health practitioner: Patient    Discussion: We discussed goals of care for Bradley [name] .  Explained that he has chronic and severe decubitus ulcers resulting in severe and recurrent infections. Discussed that this is unlikely to be a process that can be reversed, and that he would have recurrent infections into the future.  We discussed code status, and his wishes should his condition were to deteriorate. Patient would not want to have any breathing tube placed should he be unable to breath. He would also not want to be resuscitated and requests no CPR should his heart stop.  Code status:   Code Status: DNR/DNI  Disposition: Continue current acute care  Time spent for the meeting: 15 minu8es    Bradley November, MD  05/29/2024, 8:49 AM   "

## 2024-05-29 NOTE — Progress Notes (Signed)
 PHARMACY CONSULT NOTE - ELECTROLYTES  Pharmacy Consult for Electrolyte Monitoring and Replacement   Recent Labs: Height: 6' 2 (188 cm) Weight: 69.4 kg (153 lb) IBW/kg (Calculated) : 82.2 Estimated Creatinine Clearance: 72.3 mL/min (A) (by C-G formula based on SCr of 0.35 mg/dL (L)). Potassium (mmol/L)  Date Value  05/29/2024 2.8 (L)  05/16/2014 3.1 (L)   Magnesium  (mg/dL)  Date Value  87/75/7974 1.8  06/17/2011 2.3   Calcium (mg/dL)  Date Value  87/75/7974 7.5 (L)   Calcium, Total (mg/dL)  Date Value  87/88/7984 7.9 (L)   Albumin  (g/dL)  Date Value  87/75/7974 2.0 (L)  05/13/2014 2.5 (L)   Phosphorus (mg/dL)  Date Value  87/75/7974 2.0 (L)   Sodium (mmol/L)  Date Value  05/29/2024 142  05/16/2014 142   Assessment  Bradley [name] is a 78 y.o. male presenting with influenza A and pressure injury to buttocks. PMH significant for recurrent UTI, recent right AKA, previous left AKA, chronic anemia, hypertension, stage IV sacral decubitus ulcer. Pharmacy has been consulted to monitor and replace electrolytes.  Diet: NPO MIVF: LR @ 150 mL/hr Pertinent medications: None   Goal of Therapy: Electrolytes WNL   Plan:  K 2.8, Kcl 40 mEq PO x 1 dose (+ ~ 40 mEq from Phos-Nak packets) Phos 2, Phos-Nak 2 packets q4h x 3 doses Mg 1.8, magnesium  sulfate 2 g IV x 1 Re-check a K+ at 1700 and re-check BMP, Mg, Phos with AM labs  Thank you for allowing pharmacy to be a part of this patient's care.  Bradley [name] 05/29/2024 7:47 AM
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
## 2024-05-29 NOTE — Plan of Care (Signed)

## 2024-05-29 NOTE — Progress Notes (Addendum)
 PHARMACY CONSULT NOTE - ELECTROLYTES  Pharmacy Consult for Electrolyte Monitoring and Replacement   Recent Labs: Height: 6' 2 (188 cm) Weight: 69.4 kg (153 lb) IBW/kg (Calculated) : 82.2 Estimated Creatinine Clearance: 72.3 mL/min (A) (by C-G formula based on SCr of 0.35 mg/dL (L)). Potassium (mmol/L)  Date Value  05/29/2024 3.0 (L)  05/16/2014 3.1 (L)   Magnesium  (mg/dL)  Date Value  87/75/7974 1.8  06/17/2011 2.3   Calcium (mg/dL)  Date Value  87/75/7974 7.5 (L)   Calcium, Total (mg/dL)  Date Value  87/88/7984 7.9 (L)   Albumin  (g/dL)  Date Value  87/75/7974 2.0 (L)  05/13/2014 2.5 (L)   Phosphorus (mg/dL)  Date Value  87/75/7974 2.6   Sodium (mmol/L)  Date Value  05/29/2024 142  05/16/2014 142   Assessment  Bradley [name] is a 78 y.o. male presenting with influenza A and pressure injury to buttocks. PMH significant for recurrent UTI, recent right AKA, previous left AKA, chronic anemia, hypertension, stage IV sacral decubitus ulcer. Pharmacy has been consulted to monitor and replace electrolytes.  Diet: Regular MIVF: LR @ 150 mL/hr Pertinent medications: None   Goal of Therapy: Electrolytes WNL   Plan:  K 3.0, Kcl 40 mEq PO x 1 dose  Re-check BMP, Mg, Phos with AM labs  Thank you for allowing pharmacy to be a part of this patient's care.  Bradley [name] 05/29/2024 9:18 PM

## 2024-05-29 NOTE — Progress Notes (Signed)
 Peripherally Inserted Central Catheter Placement  The IV Nurse has discussed with the patient and/or persons authorized to consent for the patient, the purpose of this procedure and the potential benefits and risks involved with this procedure.  The benefits include less needle sticks, lab draws from the catheter, and the patient may be discharged home with the catheter. Risks include, but not limited to, infection, bleeding, blood clot (thrombus formation), and puncture of an artery; nerve damage and irregular heartbeat and possibility to perform a PICC exchange if needed/ordered by physician.  Alternatives to this procedure were also discussed.  Bard Power PICC patient education guide, fact sheet on infection prevention and patient information card has been provided to patient /or left at bedside.    PICC Placement Documentation  PICC Triple Lumen 05/29/24 Right Brachial 42 cm 0 cm (Active)  Indication for Insertion or Continuance of Line Vasoactive infusions 05/29/24 1326  Exposed Catheter (cm) 0 cm 05/29/24 1326  Site Assessment Clean, Dry, Intact 05/29/24 1326  Lumen #1 Status Flushed;Saline locked;Blood return noted 05/29/24 1326  Lumen #2 Status Saline locked;Flushed;Blood return noted 05/29/24 1326  Lumen #3 Status Flushed;Saline locked;Blood return noted 05/29/24 1326  Dressing Type Transparent;Securing device 05/29/24 1326  Dressing Status Antimicrobial disc/dressing in place 05/29/24 1326  Line Care Connections checked and tightened 05/29/24 1326  Line Adjustment (NICU/IV Team Only) No 05/29/24 1326  Dressing Intervention New dressing 05/29/24 1326  Dressing Change Due 06/05/24 05/29/24 1326       Renaee Notice Albarece 05/29/2024, 1:38 PM
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
## 2024-05-29 NOTE — Plan of Care (Signed)
" °  Problem: Education: Goal: Knowledge of General Education information will improve Description: Including pain rating scale, medication(s)/side effects and non-pharmacologic comfort measures Outcome: Progressing   Problem: Clinical Measurements: Goal: Ability to maintain clinical measurements within normal limits will improve Outcome: Progressing Goal: Respiratory complications will improve Outcome: Progressing Goal: Cardiovascular complication will be avoided Outcome: Progressing   Problem: Clinical Measurements: Goal: Diagnostic test results will improve Outcome: Not Progressing   Problem: Skin Integrity: Goal: Risk for impaired skin integrity will decrease Outcome: Not Progressing   "

## 2024-05-29 NOTE — Progress Notes (Signed)
 Received consult on behalf of patient. Confirmed with primary RN that patient has two working PIVs. Multiple antibiotics, electrolyte replacement therapies, blood products, and vasopressor support therapy ordered. Recommended primary team to assess for central line placement at this time due to ongoing vasopressor support and multiple infusions ordered. PICC order placed by Dr. Isadora. Encouraged to reach out to IV team if one or more current PIVs are lost while waiting for central line placement.
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
## 2024-05-29 NOTE — Consult Note (Signed)
 WOC Nurse Consult Note: Reason for Consult: Chronic nonhealing wounds to sacrum, lumbar and ischial areas.  Wounds are heavily draining.   Surgery did a bedside debridement due to loosely adherent necrotic tissue and necrotic odor.  Not a surgical candidate.  Palliative care is involved.  Patient is a DNR.   Wound type: Stage 4 pressure injuries, chronic Pressure Injury POA: Yes Measurement: Bedside RN to obtain and place in flow sheet.  Recent bedside debridement  Wound bed: Ruddy red, bleeding. Some nonviable tissue present.  Drainage (amount, consistency, odor) heavy serosanguinous  necrotic odor.  Periwound: scarring from previous open wounds.  Dressing procedure/placement/frequency: Cleanse wounds with VASHE.  Line wound beds with Aquacel (LAWSON # [id number])  Fill in defect with kerlix.  Cover with ABD pads and tape.  Apply mesh underwear to hold in place.  Change each shift and pRN saturation.  Will not follow at this time.  Please re-consult if needed.  Darice Cooley MSN, RN, FNP-BC [facility] Wound, Ostomy, Continence Nurse Outpatient [facility] [phone] Work cell phone:  [phone]

## 2024-05-30 DIAGNOSIS — M726 Necrotizing fasciitis: Secondary | ICD-10-CM

## 2024-05-30 DIAGNOSIS — J101 Influenza due to other identified influenza virus with other respiratory manifestations: Secondary | ICD-10-CM | POA: Diagnosis not present

## 2024-05-30 DIAGNOSIS — R7401 Elevation of levels of liver transaminase levels: Secondary | ICD-10-CM | POA: Diagnosis not present

## 2024-05-30 DIAGNOSIS — R6521 Severe sepsis with septic shock: Secondary | ICD-10-CM | POA: Diagnosis not present

## 2024-05-30 DIAGNOSIS — B9620 Unspecified Escherichia coli [E. coli] as the cause of diseases classified elsewhere: Secondary | ICD-10-CM | POA: Diagnosis not present

## 2024-05-30 DIAGNOSIS — Z7189 Other specified counseling: Secondary | ICD-10-CM

## 2024-05-30 DIAGNOSIS — J09X9 Influenza due to identified novel influenza A virus with other manifestations: Secondary | ICD-10-CM | POA: Diagnosis not present

## 2024-05-30 DIAGNOSIS — E876 Hypokalemia: Secondary | ICD-10-CM | POA: Diagnosis not present

## 2024-05-30 DIAGNOSIS — E870 Hyperosmolality and hypernatremia: Secondary | ICD-10-CM

## 2024-05-30 DIAGNOSIS — G8220 Paraplegia, unspecified: Secondary | ICD-10-CM

## 2024-05-30 DIAGNOSIS — Z1612 Extended spectrum beta lactamase (ESBL) resistance: Secondary | ICD-10-CM

## 2024-05-30 DIAGNOSIS — A4151 Sepsis due to Escherichia coli [E. coli]: Secondary | ICD-10-CM

## 2024-05-30 DIAGNOSIS — D649 Anemia, unspecified: Secondary | ICD-10-CM | POA: Diagnosis not present

## 2024-05-30 DIAGNOSIS — R7881 Bacteremia: Secondary | ICD-10-CM

## 2024-05-30 DIAGNOSIS — L8944 Pressure ulcer of contiguous site of back, buttock and hip, stage 4: Secondary | ICD-10-CM

## 2024-05-30 DIAGNOSIS — E43 Unspecified severe protein-calorie malnutrition: Secondary | ICD-10-CM | POA: Diagnosis not present

## 2024-05-30 LAB — CBC
HCT: 26.1 % — ABNORMAL LOW (ref 39.0–52.0)
Hemoglobin: 8.3 g/dL — ABNORMAL LOW (ref 13.0–17.0)
MCH: 25.5 pg — ABNORMAL LOW (ref 26.0–34.0)
MCHC: 31.8 g/dL (ref 30.0–36.0)
MCV: 80.1 fL (ref 80.0–100.0)
Platelets: 306 K/uL (ref 150–400)
RBC: 3.26 MIL/uL — ABNORMAL LOW (ref 4.22–5.81)
RDW: 20.9 % — ABNORMAL HIGH (ref 11.5–15.5)
WBC: 6.5 K/uL (ref 4.0–10.5)
nRBC: 0 % (ref 0.0–0.2)

## 2024-05-30 LAB — BASIC METABOLIC PANEL WITH GFR
Anion gap: 10 (ref 5–15)
Anion gap: 8 (ref 5–15)
BUN: 5 mg/dL — ABNORMAL LOW (ref 8–23)
BUN: <5 mg/dL — ABNORMAL LOW (ref 8–23)
CO2: 30 mmol/L (ref 22–32)
CO2: 29 mmol/L (ref 22–32)
Calcium: 7.7 mg/dL — ABNORMAL LOW (ref 8.9–10.3)
Calcium: 7.6 mg/dL — ABNORMAL LOW (ref 8.9–10.3)
Chloride: 111 mmol/L (ref 98–111)
Chloride: 108 mmol/L (ref 98–111)
Creatinine, Ser: 0.36 mg/dL — ABNORMAL LOW (ref 0.61–1.24)
Creatinine, Ser: 0.38 mg/dL — ABNORMAL LOW (ref 0.61–1.24)
GFR, Estimated: >60 mL/min
GFR, Estimated: >60 mL/min
Glucose, Bld: 109 mg/dL — ABNORMAL HIGH (ref 70–99)
Glucose, Bld: 110 mg/dL — ABNORMAL HIGH (ref 70–99)
Potassium: 2.9 mmol/L — ABNORMAL LOW (ref 3.5–5.1)
Potassium: 3.3 mmol/L — ABNORMAL LOW (ref 3.5–5.1)
Sodium: 151 mmol/L — ABNORMAL HIGH (ref 135–145)
Sodium: 145 mmol/L (ref 135–145)

## 2024-05-30 LAB — CLOSTRIDIUM DIFFICILE BY PCR, REFLEXED
Hypervirulent Strain: NEGATIVE
Toxigenic C. Difficile by PCR: POSITIVE — AB

## 2024-05-30 LAB — GLUCOSE, CAPILLARY
Glucose-Capillary: 123 mg/dL — ABNORMAL HIGH (ref 70–99)
Glucose-Capillary: 100 mg/dL — ABNORMAL HIGH (ref 70–99)
Glucose-Capillary: 105 mg/dL — ABNORMAL HIGH (ref 70–99)
Glucose-Capillary: 123 mg/dL — ABNORMAL HIGH (ref 70–99)
Glucose-Capillary: 82 mg/dL (ref 70–99)

## 2024-05-30 LAB — MAGNESIUM: Magnesium: 2.1 mg/dL (ref 1.7–2.4)

## 2024-05-30 LAB — C DIFFICILE QUICK SCREEN W PCR REFLEX
C Diff antigen: POSITIVE — AB
C Diff toxin: NEGATIVE

## 2024-05-30 LAB — PHOSPHORUS: Phosphorus: 2.8 mg/dL (ref 2.5–4.6)

## 2024-05-30 MED ORDER — ALBUMIN HUMAN 25 % IV SOLN
25.0000 g | Freq: Once | INTRAVENOUS | Status: AC
  Administered 2024-05-30: 25 g via INTRAVENOUS
  Filled 2024-05-30: qty 100

## 2024-05-30 MED ORDER — MIDODRINE HCL 5 MG PO TABS
5.0000 mg | ORAL_TABLET | Freq: Three times a day (TID) | ORAL | Status: DC
  Administered 2024-05-31: 5 mg via ORAL
  Filled 2024-05-30: qty 1

## 2024-05-30 MED ORDER — POTASSIUM CHLORIDE 10 MEQ/50ML IV SOLN
10.0000 meq | INTRAVENOUS | Status: AC
  Administered 2024-05-30 (×6): 10 meq via INTRAVENOUS
  Filled 2024-05-30 (×6): qty 50

## 2024-05-30 MED ORDER — POTASSIUM CHLORIDE 20 MEQ PO PACK
40.0000 meq | PACK | ORAL | Status: AC
  Administered 2024-05-30: 40 meq via ORAL
  Filled 2024-05-30: qty 2

## 2024-05-30 MED ORDER — POTASSIUM CHLORIDE 10 MEQ/100ML IV SOLN
10.0000 meq | INTRAVENOUS | Status: AC
  Administered 2024-05-30 (×4): 10 meq via INTRAVENOUS
  Filled 2024-05-30 (×4): qty 100

## 2024-05-30 MED ORDER — LACTATED RINGERS IV BOLUS
1000.0000 mL | Freq: Once | INTRAVENOUS | Status: AC
  Administered 2024-05-30: 1000 mL via INTRAVENOUS

## 2024-05-30 NOTE — Plan of Care (Signed)

## 2024-05-30 NOTE — Progress Notes (Signed)
 Pt continued to have decreased PO intake. Son was at bedside and tried to encourage pt w/little success. Will continue to encourage and educate pt.

## 2024-05-30 NOTE — Progress Notes (Signed)
 ID Brief note  #Ecoli(ctx-m) bacteremia 2/2 sacral wound #hx of Candida auris in Urine cx and [facility] -CT showed large sacral ulcers with chronic osteo, soft tissue gas with concern for nec fasc Plan -Merrem  to cover ecoli. Continue linezolid  given nec fac concern -Micafungin  given candida auris Hx(ua showed budding yeast) -follow blood and urine cx -Surgery consult pending -Official ID consult pending

## 2024-05-30 NOTE — Progress Notes (Signed)
 Pt refused his 40 mEq of Klor-con . Notified MD  Education given. Pt verb understanding.
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
## 2024-05-30 NOTE — Progress Notes (Signed)
 PHARMACY CONSULT NOTE - ELECTROLYTES  Pharmacy Consult for Electrolyte Monitoring and Replacement   Recent Labs: Height: 6' 2 (188 cm) Weight: 69.4 kg (153 lb) IBW/kg (Calculated) : 82.2 Estimated Creatinine Clearance: 72.3 mL/min (A) (by C-G formula based on SCr of 0.36 mg/dL (L)). Potassium (mmol/L)  Date Value  05/30/2024 2.9 (L)  05/16/2014 3.1 (L)   Magnesium  (mg/dL)  Date Value  87/74/7974 2.1  06/17/2011 2.3   Calcium (mg/dL)  Date Value  87/74/7974 7.7 (L)   Calcium, Total (mg/dL)  Date Value  87/88/7984 7.9 (L)   Albumin  (g/dL)  Date Value  87/75/7974 2.0 (L)  05/13/2014 2.5 (L)   Phosphorus (mg/dL)  Date Value  87/74/7974 2.8   Sodium (mmol/L)  Date Value  05/30/2024 151 (H)  05/16/2014 142   Assessment  Bradley [name] is a 78 y.o. male presenting with influenza A and pressure injury to buttocks. PMH significant for recurrent UTI, recent right AKA, previous left AKA, chronic anemia, hypertension, stage IV sacral decubitus ulcer. Pharmacy has been consulted to monitor and replace electrolytes.  Diet: NPO MIVF: N/A Pertinent medications: None   Goal of Therapy: Electrolytes WNL   Plan:  Na 151, defer management to PCCM team K 2.9, Kcl 10 mEq IV q1h x 6 ordered per PCCM team Check K+ this afternoon and all labs again tomorrow AM  Thank you for allowing pharmacy to be a part of this patient's care.  Bradley [name] Mare 05/30/2024 6:43 AM

## 2024-05-30 NOTE — Consult Note (Addendum)
 NAME: Bradley [name]  DOB: [birth year]  MRN: [id number]  Date/Time: 05/30/2024 12:53 PM  REQUESTING PROVIDER: Dr.Dgayli Subjective:  REASON FOR CONSULT: ESBL ecoli bacteremia ? Bradley [name] is a 78 y.o. male with a history of T7 spinal cord injury with paraplegia, neurogenic bladder , chronic sacral decubitus S/p colostomy, chronic Foley, left AKA presents from home with purulent discharge from the sacral wounds and shortness of breath.  Patient has had multiple hospitalization in the last 3 months. between 02/09/2024 until 02/21/2024 he was hospitalized at [facility] for infection of the chronic sacral wound and the culture which was positive for MRSA Acinetobacter and mixed organisms and was treated with meropenem  1 g 9/-5 9/13 and vancomycin  2 g 9/ 6-/9/13 and sent on minocycline 200 mg.   he was not a candidate for any type of surgery including flap surgery and so was managed medically.Admitted to [facility] 03/26/24-04/20/24 with Enterococcus faecalis bacteremia,Proteus and clostridium bacteremia secondary to severely infected necrotic sacral decubitus , underwent bedside debridement, was treated with IV Unasyn  and sent to [facility].  He was discharged from [facility] a week ago.  He also had right AKA at [facility] for infected heel ulcers.  He was sent home with a wound VAC.  His visiting nurse and changing the wound VAC noted purulent discharge and communicated with this PCP who asked him to go to the ED.  Patient was also having some shortness of breath. Vitals in the ED temp 97.5, BP 64/52. HR 129,RR 18.  He tested positive for flu A.  Started on Tamiflu . Blood cultures were sent.  As patient was hypotensive he was admitted to ICU and started on pressors. Was placed on meropenem , linezolid  and also later and he developed function was added for Candida auris which was present in the previous urine culture. As the blood cultures come back positive for ESBL E. coli I am asked to see the patient. Patient states he has  got poor appetite and has also not been able to swallow well He is fatigued He has lost weight He was seen by surgeon who did debridement at bedside and sent cultures.   Past Medical History:  Diagnosis Date   Anemia    Arthritis    Paralysis of both lower limbs (HCC)    Sacral decubitus ulcer     Past Surgical History:  Procedure Laterality Date   COLONOSCOPY  06/06/2014   INCISION AND DRAINAGE OF WOUND N/A 06/04/2018   Procedure: IRRIGATION AND DEBRIDEMENT WOUND;  Surgeon: Desiderio Schanz, MD;  Location: [facility] ORS;  Service: General;  Laterality: N/A;   TONSILLECTOMY     WOUND DEBRIDEMENT Left 01/28/2023   Procedure: LEFT LEG DEBRIDEMENT OF ULCER NEAR GLUTEUS;  Surgeon: Tye Millet, DO;  Location: [facility] ORS;  Service: General;  Laterality: Left;    Social History   Socioeconomic History   Marital status: Widowed    Spouse name: Not on file   Number of children: Not on file   Years of education: Not on file   Highest education level: Not on file  Occupational History   Not on file  Tobacco Use   Smoking status: Former    Current packs/day: 0.00    Average packs/day: 0.5 packs/day for 5.0 years (2.5 ttl pk-yrs)    Types: Cigarettes    Start date: 73    Quit date: 1    Years since quitting: 54.0   Smokeless tobacco: Never  Vaping Use   Vaping status: Never Used  Substance  and Sexual Activity   Alcohol use: No   Drug use: No   Sexual activity: Not Currently  Other Topics Concern   Not on file  Social History Narrative   Not on file   Social Drivers of Health   Tobacco Use: Medium Risk (05/28/2024)   Patient History    Smoking Tobacco Use: Former    Smokeless Tobacco Use: Never    Passive Exposure: Not on file  Financial Resource Strain: Low Risk (02/13/2024)   Received from [facility]   Overall Financial Resource Strain (CARDIA)    How hard is it for you to pay for the very basics like food, housing, medical care, and heating?: Not very hard  Food  Insecurity: No Food Insecurity (05/29/2024)   Epic    Worried About Radiation Protection Practitioner of Food in the Last Year: Never true    Ran Out of Food in the Last Year: Never true  Transportation Needs: No Transportation Needs (05/29/2024)   Epic    Lack of Transportation (Medical): No    Lack of Transportation (Non-Medical): No  Physical Activity: Not on file  Stress: Not on file  Social Connections: Unknown (05/29/2024)   Social Connection and Isolation Panel    Frequency of Communication with Friends and Family: More than three times a week    Frequency of Social Gatherings with Friends and Family: More than three times a week    Attends Religious Services: Not on file    Active Member of Clubs or Organizations: Patient declined    Attends Banker Meetings: Patient declined    Marital Status: Widowed  Intimate Partner Violence: Not At Risk (05/29/2024)   Epic    Fear of Current or Ex-Partner: No    Emotionally Abused: No    Physically Abused: No    Sexually Abused: No  Depression (PHQ2-9): Low Risk (03/02/2023)   Depression (PHQ2-9)    PHQ-2 Score: 1  Alcohol Screen: Not on file  Housing: Low Risk (05/29/2024)   Epic    Unable to Pay for Housing in the Last Year: No    Number of Times Moved in the Last Year: 0    Homeless in the Last Year: No  Utilities: Not At Risk (05/29/2024)   Epic    Threatened with loss of utilities: No  Health Literacy: Not on file    Family History  Problem Relation Age of Onset   Breast cancer Mother    Allergies[1] I? Current Facility-Administered Medications  Medication Dose Route Frequency Provider Last Rate Last Admin   acetaminophen  (TYLENOL ) tablet 650 mg  650 mg Oral Q6H PRN Rust-Chester, Britton L, NP   650 mg at 05/29/24 9661   ascorbic acid  (VITAMIN C ) tablet 500 mg  500 mg Oral BID Rust-Chester, Britton L, NP   500 mg at 05/30/24 9178   Chlorhexidine  Gluconate Cloth 2 % PADS 6 each  6 each Topical Daily Rust-Chester, Jenita CROME, NP    6 each at 05/30/24 9177   dextrose  50 % solution 50 mL  50 mL Intravenous PRN Niu, Xilin, MD       docusate sodium  (COLACE) capsule 100 mg  100 mg Oral BID PRN Rust-Chester, Britton L, NP       enoxaparin  (LOVENOX ) injection 40 mg  40 mg Subcutaneous QHS Rust-Chester, Britton L, NP   40 mg at 05/29/24 2232   feeding supplement (ENSURE PLUS HIGH PROTEIN) liquid 237 mL  237 mL Oral TID BM Isadora Hose, MD  237 mL at 05/29/24 1159   linezolid  (ZYVOX ) IVPB 600 mg  600 mg Intravenous Q12H Rust-Chester, Britton L, NP 300 mL/hr at 05/30/24 0900 Infusion Verify at 05/30/24 0900   meropenem  (MERREM ) 1 g in sodium chloride  0.9 % 100 mL IVPB  1 g Intravenous Q8H Dennise Kingsley, MD   Stopped at 05/30/24 0839   micafungin  (MYCAMINE ) 100 mg in sodium chloride  0.9 % 100 mL IVPB  100 mg Intravenous Q24H Dennise Kingsley, MD 105 mL/hr at 05/30/24 0944 100 mg at 05/30/24 0944   morphine  (PF) 2 MG/ML injection 2 mg  2 mg Intravenous Q4H PRN Rust-Chester, Britton L, NP   2 mg at 05/29/24 2006   multivitamin (PROSIGHT) tablet 1 tablet  1 tablet Oral Daily Isadora Hose, MD   1 tablet at 05/30/24 9178   mupirocin  ointment (BACTROBAN ) 2 % 1 Application  1 Application Nasal BID Rust-Chester, Jenita CROME, NP   1 Application at 05/30/24 0944   norepinephrine  (LEVOPHED ) 4mg  in (0.016 mg/mL) premix infusion  0-10 mcg/min Intravenous Continuous Daren Ronnald BRAVO, NP 11.25 mL/hr at 05/30/24 0900 3 mcg/min at 05/30/24 0900   Oral care mouth rinse  15 mL Mouth Rinse PRN Isadora Hose, MD       oseltamivir  (TAMIFLU ) capsule 75 mg  75 mg Oral BID Isadora Hose, MD   75 mg at 05/30/24 9178   pantoprazole  (PROTONIX ) EC tablet 40 mg  40 mg Oral Daily Rust-Chester, Britton L, NP   40 mg at 05/30/24 0822   polyethylene glycol (MIRALAX  / GLYCOLAX ) packet 17 g  17 g Oral Daily PRN Rust-Chester, Britton L, NP       potassium chloride  (KLOR-CON ) packet 40 mEq  40 mEq Oral Q4H Dgayli, Hose, MD   40 mEq at 05/30/24 0943   sodium  chloride flush (NS) 0.9 % injection 10-40 mL  10-40 mL Intracatheter Q12H Dgayli, Hose, MD   10 mL at 05/30/24 9176   sodium chloride  flush (NS) 0.9 % injection 10-40 mL  10-40 mL Intracatheter PRN Isadora Hose, MD       zinc  sulfate (50mg  elemental zinc ) capsule 220 mg  220 mg Oral Daily Rust-Chester, Jenita CROME, NP   220 mg at 05/30/24 9178     Abtx:  Anti-infectives (From admission, onward)    Start     Dose/Rate Route Frequency Ordered Stop   05/29/24 2200  meropenem  (MERREM ) 1 g in sodium chloride  0.9 % 100 mL IVPB        1 g 33.3 mL/hr over 180 Minutes Intravenous Every 8 hours 05/29/24 1107     05/29/24 1200  meropenem  (MERREM ) 1 g in sodium chloride  0.9 % 100 mL IVPB        1 g 200 mL/hr over 30 Minutes Intravenous  Once 05/29/24 1107 05/29/24 1244   05/29/24 1000  micafungin  (MYCAMINE ) 100 mg in sodium chloride  0.9 % 100 mL IVPB        100 mg 105 mL/hr over 1 Hours Intravenous Every 24 hours 05/29/24 0910     05/29/24 0830  oseltamivir  (TAMIFLU ) capsule 75 mg        75 mg Oral 2 times daily 05/29/24 0731 06/03/24 0959   05/29/24 0000  fluconazole  (DIFLUCAN ) IVPB 400 mg  Status:  Discontinued        400 mg 100 mL/hr over 120 Minutes Intravenous Every 24 hours 05/28/24 2106 05/29/24 0910   05/28/24 2200  linezolid  (ZYVOX ) IVPB 600 mg  600 mg 300 mL/hr over 60 Minutes Intravenous Every 12 hours 05/28/24 2033     05/28/24 2200  piperacillin -tazobactam (ZOSYN ) IVPB 3.375 g  Status:  Discontinued        3.375 g 12.5 mL/hr over 240 Minutes Intravenous Every 8 hours 05/28/24 2106 05/29/24 1107   05/28/24 1615  ceFEPIme  (MAXIPIME ) 2 g in sodium chloride  0.9 % 100 mL IVPB        2 g 200 mL/hr over 30 Minutes Intravenous  Once 05/28/24 1612 05/28/24 1717   05/28/24 1615  metroNIDAZOLE  (FLAGYL ) IVPB 500 mg        500 mg 100 mL/hr over 60 Minutes Intravenous  Once 05/28/24 1612 05/28/24 1820   05/28/24 1615  vancomycin  (VANCOCIN ) IVPB 1000 mg/200 mL premix        1,000  mg 200 mL/hr over 60 Minutes Intravenous  Once 05/28/24 1612 05/28/24 2036       REVIEW OF SYSTEMS:  Const: negative fever, negative chills, positive weight loss Eyes: negative diplopia or visual changes, negative eye pain ENT: negative coryza, negative sore throat Resp:  cough, , dyspnea Cards: negative for chest pain, palpitations, lower extremity edema GU: Has Foley GI: Has colostomy Negative for abdominal pain, diarrhea, bleeding, constipation Skin: negative for rash and pruritus Heme: negative for easy bruising and gum/nose bleeding MS General Weakness Neurolo: Paraplegia Psych: depression  Endocrine: negative for thyroid , diabetes Allergy/Immunology- negative for any medication or food allergies ? Pertinent Positives include : Objective:  VITALS:  BP (!) 103/91   Pulse 79   Temp (!) 97.5 F (36.4 C) (Axillary)   Resp 19   Ht 6' 2 (1.88 m)   Wt 69.4 kg   SpO2 100%   BMI 19.64 kg/m  LDA Foley [name] PHYSICAL EXAM:  General: Alert, cooperative, slow response, pale  Head: Normocephalic, without obvious abnormality, atraumatic. Eyes: Conjunctivae clear, anicteric sclerae. Pupils are equal ENT Nares normal. No drainage or sinus tenderness. Lips, mucosa, and tongue normal. No Thrush Neck: Supple, symmetrical, no adenopathy, thyroid : non tender no carotid bruit and no JVD. Back: Huge sacral decubitus extending from the lower back upto his buttocks Purulent discharge Granulation tissue present      lungs: Bilateral air entry heart: Regular rate and rhythm, no murmur, rub or gallop. Abdomen: Soft, non-tender,not distended. Bowel sounds normal. No masses Extremities: Bilateral AKA Right side AKA stump has staples.   Skin: No rashes or lesions. Or bruising Lymph: Cervical, supraclavicular normal. Neurologic: Paraplegia pertinent Labs Lab Results CBC    Component Value Date/Time   WBC 6.5 05/30/2024 0528   RBC 3.26 (L) 05/30/2024 0528   HGB  8.3 (L) 05/30/2024 0528   HGB 7.3 (L) 02/07/2024 1321   HGB 8.2 (L) 05/16/2014 0411   HCT 26.1 (L) 05/30/2024 0528   HCT 26.4 (L) 05/16/2014 0411   PLT 306 05/30/2024 0528   PLT 558 (H) 02/07/2024 1321   PLT 424 05/16/2014 0411   MCV 80.1 05/30/2024 0528   MCV 70 (L) 05/16/2014 0411   MCH 25.5 (L) 05/30/2024 0528   MCHC 31.8 05/30/2024 0528   RDW 20.9 (H) 05/30/2024 0528   RDW 16.9 (H) 05/16/2014 0411   LYMPHSABS 2.8 05/28/2024 1612   LYMPHSABS 1.8 05/16/2014 0411   MONOABS 0.8 05/28/2024 1612   MONOABS 0.5 05/16/2014 0411   EOSABS 0.0 05/28/2024 1612   EOSABS 0.3 05/16/2014 0411   BASOSABS 0.0 05/28/2024 1612   BASOSABS 0.0 05/16/2014 0411       Latest  Ref Rng & Units 05/30/2024    5:28 AM 05/29/2024    8:55 PM 05/29/2024    4:51 PM  CMP  Glucose 70 - 99 mg/dL 890  855    BUN 8 - 23 mg/dL 5  6    Creatinine 9.38 - 1.24 mg/dL 9.63  9.62    Sodium 864 - 145 mmol/L 151  150    Potassium 3.5 - 5.1 mmol/L 2.9  2.8  3.0   Chloride 98 - 111 mmol/L 111  109    CO2 22 - 32 mmol/L 30  28    Calcium 8.9 - 10.3 mg/dL 7.7  7.8        Microbiology: Recent Results (from the past 240 hours)  Blood Culture (routine x 2)     Status: Abnormal (Preliminary result)   Collection Time: 05/28/24  4:12 PM   Specimen: BLOOD LEFT WRIST  Result Value Ref Range Status   Specimen Description   Final    BLOOD LEFT WRIST Performed at [facility], [address], [place], [place] [address]    Special Requests   Final    BOTTLES DRAWN AEROBIC AND ANAEROBIC Blood Culture results may not be optimal due to an inadequate volume of blood received in culture bottles Performed at [facility], [address] [place], [place] [address]    Culture  Setup Time   Final    GRAM NEGATIVE RODS IN BOTH AEROBIC AND ANAEROBIC BOTTLES CRITICAL RESULT CALLED TO, READ BACK BY AND VERIFIED WITH: LISA KLUTZ PHARMD ON 05/29/2024 AT 1100 BY KC CRITICAL VALUE NOTED.  VALUE IS CONSISTENT WITH  PREVIOUSLY REPORTED AND CALLED VALUE. Performed at [facility], [address] [place], [place] [address]    Culture (A)  Final    ESCHERICHIA COLI SUSCEPTIBILITIES TO FOLLOW Performed at [facility], [address], [place], [place] [address]    Report Status PENDING  Incomplete  Blood Culture ID Panel (Reflexed)     Status: Abnormal   Collection Time: 05/28/24  4:12 PM  Result Value Ref Range Status   Enterococcus faecalis NOT DETECTED NOT DETECTED Final   Enterococcus Faecium NOT DETECTED NOT DETECTED Final   Listeria monocytogenes NOT DETECTED NOT DETECTED Final   Staphylococcus species NOT DETECTED NOT DETECTED Final   Staphylococcus aureus (BCID) NOT DETECTED NOT DETECTED Final   Staphylococcus epidermidis NOT DETECTED NOT DETECTED Final   Staphylococcus lugdunensis NOT DETECTED NOT DETECTED Final   Streptococcus species NOT DETECTED NOT DETECTED Final   Streptococcus agalactiae NOT DETECTED NOT DETECTED Final   Streptococcus pneumoniae NOT DETECTED NOT DETECTED Final   Streptococcus pyogenes NOT DETECTED NOT DETECTED Final   A.calcoaceticus-baumannii NOT DETECTED NOT DETECTED Final   Bacteroides fragilis NOT DETECTED NOT DETECTED Final   Enterobacterales DETECTED (A) NOT DETECTED Final    Comment: Enterobacterales represent a large order of gram negative bacteria, not a single organism. CRITICAL RESULT CALLED TO, READ BACK BY AND VERIFIED WITH: LISA KLUTZ PHARMD ON 05/29/2024 AT 1100 BY KC    Enterobacter cloacae complex NOT DETECTED NOT DETECTED Final   Escherichia coli DETECTED (A) NOT DETECTED Final    Comment: CRITICAL RESULT CALLED TO, READ BACK BY AND VERIFIED WITH: LISA KLUTZ PHARMD ON 05/29/2024 AT 1100 BY KC    Klebsiella aerogenes NOT DETECTED NOT DETECTED Final   Klebsiella oxytoca NOT DETECTED NOT DETECTED Final   Klebsiella pneumoniae NOT DETECTED NOT DETECTED Final   Proteus species NOT DETECTED NOT DETECTED  Final   Salmonella species NOT  DETECTED NOT DETECTED Final   Serratia marcescens NOT DETECTED NOT DETECTED Final   Haemophilus influenzae NOT DETECTED NOT DETECTED Final   Neisseria meningitidis NOT DETECTED NOT DETECTED Final   Pseudomonas aeruginosa NOT DETECTED NOT DETECTED Final   Stenotrophomonas maltophilia NOT DETECTED NOT DETECTED Final   Candida albicans NOT DETECTED NOT DETECTED Final   Candida auris NOT DETECTED NOT DETECTED Final   Candida glabrata NOT DETECTED NOT DETECTED Final   Candida krusei NOT DETECTED NOT DETECTED Final   Candida parapsilosis NOT DETECTED NOT DETECTED Final   Candida tropicalis NOT DETECTED NOT DETECTED Final   Cryptococcus neoformans/gattii NOT DETECTED NOT DETECTED Final   CTX-M ESBL DETECTED (A) NOT DETECTED Final    Comment: CRITICAL RESULT CALLED TO, READ BACK BY AND VERIFIED WITH: LISA KLUTZ PHARMD ON 05/29/2024 AT 1100 BY KC (NOTE) Extended spectrum beta-lactamase detected. Recommend a carbapenem as initial therapy.      Carbapenem resistance IMP NOT DETECTED NOT DETECTED Final   Carbapenem resistance KPC NOT DETECTED NOT DETECTED Final   Carbapenem resistance NDM NOT DETECTED NOT DETECTED Final   Carbapenem resist OXA 48 LIKE NOT DETECTED NOT DETECTED Final   Carbapenem resistance VIM NOT DETECTED NOT DETECTED Final    Comment: Performed at [facility], [address] [place], [place] [address]  Blood Culture (routine x 2)     Status: None (Preliminary result)   Collection Time: 05/28/24  4:17 PM   Specimen: Left Antecubital; Blood  Result Value Ref Range Status   Specimen Description LEFT ANTECUBITAL  Final   Special Requests   Final    BOTTLES DRAWN AEROBIC AND ANAEROBIC Blood Culture results may not be optimal due to an inadequate volume of blood received in culture bottles   Culture  Setup Time   Final    GRAM NEGATIVE RODS ANAEROBIC BOTTLE ONLY CRITICAL VALUE NOTED.  VALUE IS CONSISTENT WITH PREVIOUSLY REPORTED AND CALLED VALUE. Performed at  [facility], [address] [place], [place] [address]    Culture GRAM NEGATIVE RODS  Final   Report Status PENDING  Incomplete  Resp panel by RT-PCR (RSV, Flu A&B, Covid) Anterior Nasal Swab     Status: Abnormal   Collection Time: 05/28/24  4:35 PM   Specimen: Anterior Nasal Swab  Result Value Ref Range Status   SARS Coronavirus 2 by RT PCR NEGATIVE NEGATIVE Final    Comment: (NOTE) SARS-CoV-2 target nucleic acids are NOT DETECTED.  The SARS-CoV-2 RNA is generally detectable in upper respiratory specimens during the acute phase of infection. The lowest concentration of SARS-CoV-2 viral copies this assay can detect is 138 copies/mL. A negative result does not preclude SARS-Cov-2 infection and should not be used as the sole basis for treatment or other patient management decisions. A negative result may occur with  improper specimen collection/handling, submission of specimen other than nasopharyngeal swab, presence of viral mutation(s) within the areas targeted by this assay, and inadequate number of viral copies(<138 copies/mL). A negative result must be combined with clinical observations, patient history, and epidemiological information. The expected result is Negative.  Fact Sheet for Patients:  bloggercourse.com  Fact Sheet for Healthcare Providers:  seriousbroker.it  This test is no t yet approved or cleared by the [place]  FDA and  has been authorized for detection and/or diagnosis of SARS-CoV-2 by FDA under an Emergency Use Authorization (EUA). This EUA will remain  in effect (meaning this test can be used)  for the duration of the COVID-19 declaration under Section 564(b)(1) of the Act, 21 U.S.C.section 360bbb-3(b)(1), unless the authorization is terminated  or revoked sooner.       Influenza A by PCR POSITIVE (A) NEGATIVE Final   Influenza B by PCR NEGATIVE NEGATIVE Final    Comment: (NOTE) The  Xpert Xpress SARS-CoV-2/FLU/RSV plus assay is intended as an aid in the diagnosis of influenza from Nasopharyngeal swab specimens and should not be used as a sole basis for treatment. Nasal washings and aspirates are unacceptable for Xpert Xpress SARS-CoV-2/FLU/RSV testing.  Fact Sheet for Patients: bloggercourse.com  Fact Sheet for Healthcare Providers: seriousbroker.it  This test is not yet approved or cleared by the [place]  FDA and has been authorized for detection and/or diagnosis of SARS-CoV-2 by FDA under an Emergency Use Authorization (EUA). This EUA will remain in effect (meaning this test can be used) for the duration of the COVID-19 declaration under Section 564(b)(1) of the Act, 21 U.S.C. section 360bbb-3(b)(1), unless the authorization is terminated or revoked.     Resp Syncytial Virus by PCR NEGATIVE NEGATIVE Final    Comment: (NOTE) Fact Sheet for Patients: bloggercourse.com  Fact Sheet for Healthcare Providers: seriousbroker.it  This test is not yet approved or cleared by the [place]  FDA and has been authorized for detection and/or diagnosis of SARS-CoV-2 by FDA under an Emergency Use Authorization (EUA). This EUA will remain in effect (meaning this test can be used) for the duration of the COVID-19 declaration under Section 564(b)(1) of the Act, 21 U.S.C. section 360bbb-3(b)(1), unless the authorization is terminated or revoked.  Performed at [facility], [address] [place], [place] [address]   Urine Culture     Status: Abnormal (Preliminary result)   Collection Time: 05/28/24  4:35 PM   Specimen: Urine, Catheterized  Result Value Ref Range Status   Specimen Description   Final    URINE, CATHETERIZED Performed at [facility], [address] [place], [place] [address]    Special Requests   Final    NONE Reflexed from  [id number] Performed at [facility], [address] [place], [place] [address]    Culture (A)  Final    >=100,000 COLONIES/mL GRAM NEGATIVE RODS AMONG MIXED ORGANISMS SUSCEPTIBILITIES TO FOLLOW Performed at [facility], [address], [place], [place] [address]    Report Status PENDING  Incomplete  MRSA Next Gen by PCR, Nasal     Status: Abnormal   Collection Time: 05/28/24  8:40 PM   Specimen: Nasal Mucosa; Nasal Swab  Result Value Ref Range Status   MRSA by PCR Next Gen DETECTED (A) NOT DETECTED Final    Comment: RESULT CALLED TO, READ BACK BY AND VERIFIED WITH: GEORGINA VELAZQUEZ RN @2238  05/28/24 ASW (NOTE) The GeneXpert MRSA Assay (FDA approved for NASAL specimens only), is one component of a comprehensive MRSA colonization surveillance program. It is not intended to diagnose MRSA infection nor to guide or monitor treatment for MRSA infections. Test performance is not FDA approved in patients less than 27 years old. Performed at [facility], [address] [place], [place] [address]   Aerobic/Anaerobic Culture w Gram Stain (surgical/deep wound)     Status: None (Preliminary result)   Collection Time: 05/29/24 12:32 AM   Specimen: Sacral; Wound  Result Value Ref Range Status   Specimen Description   Final    SACRAL Performed at [facility], [address] [place], [place] [address]  Special Requests   Final    NONE Performed at [facility], [address] [place], [place] [address]    Gram Stain   Final    RARE WBC PRESENT, PREDOMINANTLY PMN RARE GRAM POSITIVE COCCI FEW GRAM NEGATIVE RODS    Culture   Final    FEW ESCHERICHIA COLI RARE PROTEUS MIRABILIS CULTURE REINCUBATED FOR BETTER GROWTH SUSCEPTIBILITIES TO FOLLOW Performed at [facility], [address], [place], [place] [address]    Report Status PENDING  Incomplete     Patient has: []  acute illness w/systemic sxs  [mod] [x]  illness posing risk  to life or function  [high]  I reviewed:  (3+) [x]  primary team note [x]  consultant note(s) []  procedure/op note(s) []  micro result(s)   [x]  CBC results []  chemistry results [x]  radiology report(s) []  nursing note(s)  I independently visualized:  (any)   []  cxs/plates in lab []  plain film images [x]  CT images []  PET images   []  path slide(s) []  ECG tracing []  MRI images []  nuclear scan  I discussed: (any) []  micro and/or path w/lab personnel []  drug options and/or interactions w/ID pharmD   []  procedure/OR findings w/other MD(s) []  echo and/or imaging w/other MD(s)   [x]  mgm't w/attending(s) involved in case []  setting up home abx w/OPAT team  Mgm't requires: []  prescription drug(s)  [mod] [x]  intensive toxicity monitoring  [high]   IMAGING RESULTS: CT of the abdomen and pelvis Large sacral decubitus with increased soft tissue loss Large bilateral decubitus ulcer overlying the ischial tuberosities Evidence of chronic ischial osteomyelitis and chronic sacral osteomyelitis with S4 exposed dorsally.  Soft tissue gas has improved compared to previous imaging.  But there is a concern for ongoing necrotizing fasciitis I have personally reviewed the films ? Impression/Recommendation Complicated chronic sacral and ischial decubitus ulcers with ongoing infection with necrotic tissue and underlying chronic osteomyelitis and septic shock Status post bedside debridement. The chances of healing is unlikely. May discuss with plastic surgeon Dr. Lowery though when admitted at [facility] plastic surgery was not an option   ESBL E. coli bacteremia with sepsis Source is the wound Wound cultures have been sent Patient is currently on meropenem  will continue with If the wound culture does not grow any gram-positive organisms we can discontinue linezolid  Would need minimum 2 weeks of Iv antibiotic While on antibiotics meropenem  and linezolid  monitor closely CBC especially platelet Watch out for serotonin  syndrome.   Acute flu illness on Tamiflu   Candida auris was present in a culture in [facility] which is not available But no current  systemic infection with this organism Hence will discontinue echinocandin  Anemia secondary to the extensive wounds  Paraplegia  Recent Clostridium septicum bacteremia and VRE bacteremia was treated with appropriate antibiotics.   This consult involve complex antimicrobial management  _I have personally spent  -75--minutes involved in face-to-face and non-face-to-face activities for this patient on the day of the visit. Professional time spent includes the following activities: Preparing to see the patient (review of tests), Obtaining and/or reviewing separately obtained history (admission/discharge record), Performing a medically appropriate examination and/or evaluation , Ordering medications/tests/procedures, referring and communicating with other health care professionals, Documenting clinical information in the EMR, Independently interpreting results (not separately reported), Communicating results to the patient, Counseling and educating the patient/ and Care coordination with the intensivist and his nurse.  ________________________________________________  Note:  This document was prepared using Dragon voice recognition software and may include unintentional dictation errors.     [  1]  Allergies Allergen Reactions   Lisinopril Cough

## 2024-05-30 NOTE — Plan of Care (Signed)
" °  Problem: Clinical Measurements: Goal: Cardiovascular complication will be avoided Outcome: Progressing   Problem: Pain Managment: Goal: General experience of comfort will improve and/or be controlled Outcome: Progressing   Problem: Safety: Goal: Ability to remain free from injury will improve Outcome: Progressing   Problem: Skin Integrity: Goal: Risk for impaired skin integrity will decrease Outcome: Progressing   Problem: Education: Goal: Knowledge of General Education information will improve Description: Including pain rating scale, medication(s)/side effects and non-pharmacologic comfort measures Outcome: Not Progressing   Problem: Nutrition: Goal: Adequate nutrition will be maintained Outcome: Not Progressing   "
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
## 2024-05-31 DIAGNOSIS — Z7189 Other specified counseling: Secondary | ICD-10-CM | POA: Diagnosis not present

## 2024-05-31 DIAGNOSIS — R7881 Bacteremia: Secondary | ICD-10-CM | POA: Diagnosis not present

## 2024-05-31 DIAGNOSIS — L8944 Pressure ulcer of contiguous site of back, buttock and hip, stage 4: Secondary | ICD-10-CM | POA: Diagnosis not present

## 2024-05-31 DIAGNOSIS — J101 Influenza due to other identified influenza virus with other respiratory manifestations: Secondary | ICD-10-CM | POA: Diagnosis not present

## 2024-05-31 DIAGNOSIS — B9620 Unspecified Escherichia coli [E. coli] as the cause of diseases classified elsewhere: Secondary | ICD-10-CM | POA: Diagnosis not present

## 2024-05-31 LAB — CBC
HCT: 22.8 % — ABNORMAL LOW (ref 39.0–52.0)
Hemoglobin: 7.1 g/dL — ABNORMAL LOW (ref 13.0–17.0)
MCH: 25.3 pg — ABNORMAL LOW (ref 26.0–34.0)
MCHC: 31.1 g/dL (ref 30.0–36.0)
MCV: 81.1 fL (ref 80.0–100.0)
Platelets: 196 K/uL (ref 150–400)
RBC: 2.81 MIL/uL — ABNORMAL LOW (ref 4.22–5.81)
RDW: 22 % — ABNORMAL HIGH (ref 11.5–15.5)
WBC: 5.7 K/uL (ref 4.0–10.5)
nRBC: 0 % (ref 0.0–0.2)

## 2024-05-31 LAB — HEMOGLOBIN AND HEMATOCRIT, BLOOD
HCT: 25.8 % — ABNORMAL LOW (ref 39.0–52.0)
HCT: 27.8 % — ABNORMAL LOW (ref 39.0–52.0)
Hemoglobin: 8.3 g/dL — ABNORMAL LOW (ref 13.0–17.0)
Hemoglobin: 8.8 g/dL — ABNORMAL LOW (ref 13.0–17.0)

## 2024-05-31 LAB — GLUCOSE, CAPILLARY
Glucose-Capillary: 100 mg/dL — ABNORMAL HIGH (ref 70–99)
Glucose-Capillary: 107 mg/dL — ABNORMAL HIGH (ref 70–99)
Glucose-Capillary: 68 mg/dL — ABNORMAL LOW (ref 70–99)
Glucose-Capillary: 79 mg/dL (ref 70–99)
Glucose-Capillary: 89 mg/dL (ref 70–99)
Glucose-Capillary: 93 mg/dL (ref 70–99)
Glucose-Capillary: >600 mg/dL (ref 70–99)

## 2024-05-31 LAB — BASIC METABOLIC PANEL WITH GFR
Anion gap: 7 (ref 5–15)
BUN: <5 mg/dL — ABNORMAL LOW (ref 8–23)
CO2: 28 mmol/L (ref 22–32)
Calcium: 7.4 mg/dL — ABNORMAL LOW (ref 8.9–10.3)
Chloride: 110 mmol/L (ref 98–111)
Creatinine, Ser: 0.31 mg/dL — ABNORMAL LOW (ref 0.61–1.24)
GFR, Estimated: >60 mL/min
Glucose, Bld: 71 mg/dL (ref 70–99)
Potassium: 3.4 mmol/L — ABNORMAL LOW (ref 3.5–5.1)
Sodium: 145 mmol/L (ref 135–145)

## 2024-05-31 LAB — CULTURE, BLOOD (ROUTINE X 2)

## 2024-05-31 LAB — PHOSPHORUS: Phosphorus: 2.3 mg/dL — ABNORMAL LOW (ref 2.5–4.6)

## 2024-05-31 LAB — MAGNESIUM: Magnesium: 1.8 mg/dL (ref 1.7–2.4)

## 2024-05-31 LAB — PREPARE RBC (CROSSMATCH)

## 2024-05-31 MED ORDER — MIDODRINE HCL 5 MG PO TABS
10.0000 mg | ORAL_TABLET | Freq: Three times a day (TID) | ORAL | Status: DC
  Administered 2024-05-31 – 2024-06-05 (×10): 10 mg via ORAL
  Filled 2024-05-31 (×7): qty 2

## 2024-05-31 MED ORDER — LACTATED RINGERS IV BOLUS
500.0000 mL | Freq: Once | INTRAVENOUS | Status: AC
  Administered 2024-05-31: 500 mL via INTRAVENOUS

## 2024-05-31 MED ORDER — DEXTROSE 50 % IV SOLN
12.5000 g | INTRAVENOUS | Status: AC
  Administered 2024-05-31: 12.5 g via INTRAVENOUS
  Filled 2024-05-31: qty 50

## 2024-05-31 MED ORDER — MAGNESIUM SULFATE 2 GM/50ML IV SOLN
2.0000 g | Freq: Once | INTRAVENOUS | Status: AC
  Administered 2024-05-31: 2 g via INTRAVENOUS
  Filled 2024-05-31: qty 50

## 2024-05-31 MED ORDER — DEXTROSE IN LACTATED RINGERS 5 % IV SOLN: INTRAVENOUS | Status: AC

## 2024-05-31 MED ORDER — SODIUM CHLORIDE 0.9% IV SOLUTION: Freq: Once | INTRAVENOUS | Status: DC

## 2024-05-31 MED ORDER — POTASSIUM CHLORIDE 10 MEQ/50ML IV SOLN
10.0000 meq | INTRAVENOUS | Status: AC
  Administered 2024-05-31 (×4): 10 meq via INTRAVENOUS
  Filled 2024-05-31 (×4): qty 50

## 2024-05-31 MED ORDER — POTASSIUM PHOSPHATES 15 MMOLE/5ML IV SOLN
21.0000 mmol | Freq: Once | INTRAVENOUS | Status: AC
  Administered 2024-05-31: 21 mmol via INTRAVENOUS
  Filled 2024-05-31: qty 7

## 2024-05-31 MED ORDER — LACTATED RINGERS IV SOLN: INTRAVENOUS | Status: DC

## 2024-05-31 NOTE — Progress Notes (Signed)
" °   05/31/24 2047  Spiritual Encounters  Type of Visit Initial  Care provided to: Patient  Referral source Patient request  Reason for visit Urgent spiritual support  OnCall Visit Yes  Interventions  Spiritual Care Interventions Made Reflective listening;Decision-making support/facilitation;Reconciliation with self/others;Compassionate presence   Patient requested chaplain for support during visit with son.  Son was not able to stay in room with patient during visit.  Provided compassionate support to patient, and reflective listening and conversation.  Patient reconciling with son and family regarding end of life decisions that have been made.   "

## 2024-05-31 NOTE — Care Management Important Message (Signed)
 Important Message  Patient Details  Name: Bradley [name] MRN: [id number] Date of Birth: May 14, 1946   Important Message Given:  Yes - Medicare IM     Rojelio [name] 05/31/2024, 4:43 PM

## 2024-05-31 NOTE — Progress Notes (Signed)
 "  Date of Admission:  05/28/2024      ID: Bradley [name] is a 78 y.o. male  Principal Problem:   Influenza A Active Problems:   Urinary tract infection associated with indwelling urethral catheter   Septic shock (HCC)   Bacteremia due to Escherichia coli    Subjective: No specific complaint  Medications:   sodium chloride    Intravenous Once   ascorbic acid   500 mg Oral BID   Chlorhexidine  Gluconate Cloth  6 each Topical Daily   enoxaparin  (LOVENOX ) injection  40 mg Subcutaneous QHS   feeding supplement  237 mL Oral TID BM   midodrine   10 mg Oral TID WC   multivitamin  1 tablet Oral Daily   mupirocin  ointment  1 Application Nasal BID   oseltamivir   75 mg Oral BID   pantoprazole   40 mg Oral Daily   sodium chloride  flush  10-40 mL Intracatheter Q12H   zinc  sulfate (50mg  elemental zinc )  220 mg Oral Daily    Objective: Vital signs in last 24 hours: Patient Vitals for the past 24 hrs:  BP Temp Temp src Pulse Resp SpO2 Weight  05/31/24 1100 (!) 94/43 98.7 F (37.1 C) Oral 79 (!) 21 99 % --  05/31/24 1000 (!) 100/46 -- -- 78 19 98 % --  05/31/24 0914 -- 98.3 F (36.8 C) Oral 76 15 100 % --  05/31/24 0900 (!) 83/41 -- -- 81 12 99 % --  05/31/24 0859 (!) 83/41 98.2 F (36.8 C) Oral 80 13 -- --  05/31/24 0800 (!) 89/43 -- -- 80 18 99 % --  05/31/24 0700 (!) 94/43 -- -- 82 (!) 21 100 % --  05/31/24 0630 (!) 86/47 -- -- 79 20 100 % --  05/31/24 0530 (!) 96/48 -- -- 84 (!) 22 96 % --  05/31/24 0515 -- -- -- 78 (!) 21 96 % --  05/31/24 0500 -- -- -- 79 19 98 % --  05/31/24 0445 -- -- -- 80 (!) 24 97 % --  05/31/24 0430 (!) 87/51 -- -- 88 (!) 22 94 % --  05/31/24 0415 -- -- -- 81 (!) 21 98 % --  05/31/24 0400 (!) 87/41 98.6 F (37 C) Axillary 85 (!) 22 97 % --  05/31/24 0345 (!) 94/42 -- -- 78 (!) 21 97 % --  05/31/24 0330 (!) 90/43 -- -- 72 (!) 21 97 % --  05/31/24 0230 (!) 85/43 -- -- 83 20 100 % --  05/31/24 0215 (!) 89/40 -- -- 87 (!) 22 100 % --  05/31/24 0214 (!)  89/40 -- -- 87 (!) 21 100 % 67.6 kg  05/31/24 0200 (!) 109/43 -- -- 79 (!) 22 100 % --  05/31/24 0145 (!) 98/38 -- -- 78 (!) 22 100 % --  05/31/24 0130 (!) 83/38 -- -- 88 20 99 % --  05/31/24 0115 (!) 93/44 -- -- 85 20 99 % --  05/31/24 0100 (!) 111/48 -- -- 73 14 99 % --  05/31/24 0045 (!) 106/50 -- -- 79 (!) 23 99 % --  05/31/24 0030 (!) 105/45 -- -- 77 14 100 % --  05/31/24 0015 (!) 94/53 -- -- 80 (!) 23 100 % --  05/31/24 0000 (!) 105/48 98 F (36.7 C) Axillary 79 (!) 23 99 % --  05/30/24 2345 (!) 99/45 -- -- 79 (!) 21 98 % --  05/30/24 2330 (!) 97/45 -- -- 83 (!) 21 100 % --  05/30/24 2315 (!) 107/54 -- -- 80 11 100 % --  05/30/24 2300 (!) 103/43 -- -- 80 (!) 22 98 % --  05/30/24 2245 (!) 105/44 -- -- 79 20 99 % --  05/30/24 2230 (!) 109/44 -- -- 74 (!) 21 98 % --  05/30/24 2215 (!) 101/38 -- -- 75 (!) 21 98 % --  05/30/24 2200 (!) 93/47 -- -- 84 (!) 21 98 % --  05/30/24 2145 (!) 88/38 -- -- 77 (!) 23 98 % --  05/30/24 2130 -- -- -- 76 (!) 21 97 % --  05/30/24 2115 -- -- -- 93 (!) 24 95 % --  05/30/24 2100 (!) 108/44 -- -- 87 (!) 24 97 % --  05/30/24 2045 (!) 113/49 -- -- 78 19 96 % --  05/30/24 2030 105/63 -- -- 77 (!) 21 99 % --  05/30/24 2015 (!) 90/44 -- -- 82 (!) 21 98 % --  05/30/24 2000 (!) 104/51 -- -- 80 (!) 22 100 % --  05/30/24 1945 (!) 101/45 -- -- 79 (!) 23 94 % --  05/30/24 1930 (!) 103/44 98.3 F (36.8 C) Axillary 78 (!) 23 95 % --  05/30/24 1915 (!) 98/46 -- -- 79 (!) 23 93 % --  05/30/24 1900 (!) 101/48 -- -- 81 (!) 22 94 % --  05/30/24 1800 (!) 96/41 -- -- -- 17 -- --  05/30/24 1700 (!) 102/47 -- -- -- 18 -- --  05/30/24 1600 (!) 101/43 98.4 F (36.9 C) Oral -- (!) 21 97 % --  05/30/24 1500 (!) 110/44 -- -- -- (!) 24 -- --  05/30/24 1400 (!) 79/43 -- -- -- 16 -- --  05/30/24 1300 (!) 106/44 -- -- -- 18 -- --  05/30/24 1200 (!) 102/37 98.1 F (36.7 C) Oral -- (!) 22 95 % --     Lines and Device Date on insertion # of days DC  Pharmacologist     Foley     ETT       PHYSICAL EXAM:  General: Alert, cooperative, no distress, appears stated age.  Lungs: Clear to auscultation bilaterally. No Wheezing or Rhonchi. No rales. Heart: Regular rate and rhythm, no murmur, rub or gallop. Abdomen: Soft, colostomy Back       Extremities: atraumatic, no cyanosis. No edema. No clubbing Skin: No rashes or lesions. Or bruising Lymph: Cervical, supraclavicular normal. Neurologic: paraplegia  Lab Results    Latest Ref Rng & Units 05/31/2024    5:09 AM 05/30/2024    5:28 AM 05/29/2024    7:51 PM  CBC  WBC 4.0 - 10.5 K/uL 5.7  6.5    Hemoglobin 13.0 - 17.0 g/dL 7.1  8.3  8.0   Hematocrit 39.0 - 52.0 % 22.8  26.1  25.2   Platelets 150 - 400 K/uL 196  306         Latest Ref Rng & Units 05/31/2024    5:09 AM 05/30/2024    3:40 PM 05/30/2024    5:28 AM  CMP  Glucose 70 - 99 mg/dL 71  889  890   BUN 8 - 23 mg/dL <5  <5  5   Creatinine 0.61 - 1.24 mg/dL 9.68  9.61  9.63   Sodium 135 - 145 mmol/L 145  145  151   Potassium 3.5 - 5.1 mmol/L 3.4  3.3  2.9   Chloride 98 - 111 mmol/L 110  108  111   CO2 22 -  32 mmol/L 28  29  30    Calcium 8.9 - 10.3 mg/dL 7.4  7.6  7.7       Microbiology:  Studies/Results: No results found.   Assessment/Plan: Complicated chronic sacral and ischial decubitus ulcers with ongoing infection with necrotic tissue and underlying chronic osteomyelitis and septic shock Status post bedside debridement. The chances of healing is unlikely. May discuss with plastic surgeon Dr. Lowery though when admitted at [facility] plastic surgery was not an option     ESBL E. coli bacteremia with sepsis Source is the wound Wound cultures have been sent Patient is currently on meropenem  will continue with If the wound culture does not grow any gram-positive organisms we can discontinue linezolid  Would need minimum 2 weeks of Iv antibiotic DC linezolid .     Acute flu illness on Tamiflu     Candida auris was present in a culture in [facility] which is not available But no current  systemic infection with this organism Hence discontinued echinocandin   Anemia secondary to the extensive wounds   Paraplegia   Recent Clostridium septicum bacteremia and VRE bacteremia was treated with appropriate antibiotics.    Discussed with palliative consultant Pts wound is never going to be healed- the antibiotic will help with systemic symptoms and help clear sepsis but will not cure the wound ID will follow him peripherally this weekend    "

## 2024-05-31 NOTE — Plan of Care (Signed)
   Problem: Education: Goal: Knowledge of General Education information will improve Description: Including pain rating scale, medication(s)/side effects and non-pharmacologic comfort measures Outcome: Progressing   Problem: Nutrition: Goal: Adequate nutrition will be maintained Outcome: Progressing   Problem: Coping: Goal: Level of anxiety will decrease Outcome: Progressing

## 2024-05-31 NOTE — Plan of Care (Signed)
  Problem: Education: Goal: Knowledge of General Education information will improve Description: Including pain rating scale, medication(s)/side effects and non-pharmacologic comfort measures Outcome: Progressing   Problem: Coping: Goal: Level of anxiety will decrease Outcome: Progressing   Problem: Pain Managment: Goal: General experience of comfort will improve and/or be controlled Outcome: Progressing   Problem: Skin Integrity: Goal: Risk for impaired skin integrity will decrease Outcome: Not Progressing
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
## 2024-05-31 NOTE — Progress Notes (Signed)
 CC: sacral wounds Subjective: I had a discussion with the patient about his extensive wounds.  I did discuss with him that his wounds are quite large and there are patches of necrotic tissue that are down to the bone.  He also has a bacteremia.  We talked about one of the options being surgical debridement but this would be quite extensive.  He reports that he is not interested in operative debridement and says that would be a waste of the surgeons time.   Objective: Vital signs in last 24 hours: Temp:  [98 F (36.7 C)-98.7 F (37.1 C)] 98.4 F (36.9 C) (12/26 1208) Pulse Rate:  [72-93] 87 (12/26 1208) Resp:  [11-24] 16 (12/26 1208) BP: (79-113)/(38-63) 89/53 (12/26 1208) SpO2:  [93 %-100 %] 100 % (12/26 1208) Weight:  [67.6 kg] 67.6 kg (12/26 0214) Last BM Date : 05/31/24  Intake/Output from previous day: 12/25 0701 - 12/26 0700 In: 4186.3 [I.V.:885.6; IV Piggyback:3300.7] Out: 1725 [Urine:1475; Stool:250] Intake/Output this shift: Total I/O In: 1328.5 [I.V.:316.7; Blood:404.5; IV Piggyback:607.3] Out: 950 [Urine:950]  Physical exam:  Alert and oriented, wounds not examined today  Lab Results: CBC  Recent Labs    05/30/24 0528 05/31/24 0509  WBC 6.5 5.7  HGB 8.3* 7.1*  HCT 26.1* 22.8*  PLT 306 196   BMET Recent Labs    05/30/24 1540 05/31/24 0509  NA 145 145  K 3.3* 3.4*  CL 108 110  CO2 29 28  GLUCOSE 110* 71  BUN <5* <5*  CREATININE 0.38* 0.31*  CALCIUM 7.6* 7.4*   PT/INR Recent Labs    05/28/24 1612  LABPROT 15.7*  INR 1.2   ABG No results for input(s): PHART, HCO3 in the last 72 hours.  Invalid input(s): PCO2, PO2  Studies/Results: No results found.  Anti-infectives: Anti-infectives (From admission, onward)    Start     Dose/Rate Route Frequency Ordered Stop   05/29/24 2200  meropenem  (MERREM ) 1 g in sodium chloride  0.9 % 100 mL IVPB        1 g 33.3 mL/hr over 180 Minutes Intravenous Every 8 hours 05/29/24 1107     05/29/24  1200  meropenem  (MERREM ) 1 g in sodium chloride  0.9 % 100 mL IVPB        1 g 200 mL/hr over 30 Minutes Intravenous  Once 05/29/24 1107 05/29/24 1244   05/29/24 1000  micafungin  (MYCAMINE ) 100 mg in sodium chloride  0.9 % 100 mL IVPB  Status:  Discontinued        100 mg 105 mL/hr over 1 Hours Intravenous Every 24 hours 05/29/24 0910 05/30/24 1308   05/29/24 0830  oseltamivir  (TAMIFLU ) capsule 75 mg        75 mg Oral 2 times daily 05/29/24 0731 06/03/24 0959   05/29/24 0000  fluconazole  (DIFLUCAN ) IVPB 400 mg  Status:  Discontinued        400 mg 100 mL/hr over 120 Minutes Intravenous Every 24 hours 05/28/24 2106 05/29/24 0910   05/28/24 2200  linezolid  (ZYVOX ) IVPB 600 mg        600 mg 300 mL/hr over 60 Minutes Intravenous Every 12 hours 05/28/24 2033     05/28/24 2200  piperacillin -tazobactam (ZOSYN ) IVPB 3.375 g  Status:  Discontinued        3.375 g 12.5 mL/hr over 240 Minutes Intravenous Every 8 hours 05/28/24 2106 05/29/24 1107   05/28/24 1615  ceFEPIme  (MAXIPIME ) 2 g in sodium chloride  0.9 % 100 mL IVPB  2 g 200 mL/hr over 30 Minutes Intravenous  Once 05/28/24 1612 05/28/24 1717   05/28/24 1615  metroNIDAZOLE  (FLAGYL ) IVPB 500 mg        500 mg 100 mL/hr over 60 Minutes Intravenous  Once 05/28/24 1612 05/28/24 1820   05/28/24 1615  vancomycin  (VANCOCIN ) IVPB 1000 mg/200 mL premix        1,000 mg 200 mL/hr over 60 Minutes Intravenous  Once 05/28/24 1612 05/28/24 2036       Assessment/Plan:  Patient with extensive sacral decubitus ulcers down to the bone and causing a bacteremia.  He was here back in October and at that time did not want to pursue any operative debridement at this would be an extensive debridement.  He again reaffirmed his decision that he was not interested in any surgical debridement.  For this reason appreciate continued palliative care engagement.  Recommend continued local wound care as best as possible.  ID is on board but I am not sure that there is any  benefit to continuing antibiotic treatment given that he does not want to undergo surgery. Ultimately will defer this to the primary team. Though I am no plastic surgeon I do not think that he is at all a candidate for flap reconstruction.  At this time surgery team will sign off please call us  with any questions or concerns  Jayson Endow, M.D. [facility]

## 2024-05-31 NOTE — Progress Notes (Signed)
 PHARMACY CONSULT NOTE - ELECTROLYTES  Pharmacy Consult for Electrolyte Monitoring and Replacement   Recent Labs: Height: 6' 2 (188 cm) Weight: 67.6 kg (149 lb 0.5 oz) IBW/kg (Calculated) : 82.2 Estimated Creatinine Clearance: 70.4 mL/min (A) (by C-G formula based on SCr of 0.31 mg/dL (L)). Potassium (mmol/L)  Date Value  05/31/2024 3.4 (L)  05/16/2014 3.1 (L)   Magnesium  (mg/dL)  Date Value  87/73/7974 1.8  06/17/2011 2.3   Calcium (mg/dL)  Date Value  87/73/7974 7.4 (L)   Calcium, Total (mg/dL)  Date Value  87/88/7984 7.9 (L)   Albumin  (g/dL)  Date Value  87/75/7974 2.0 (L)  05/13/2014 2.5 (L)   Phosphorus (mg/dL)  Date Value  87/73/7974 2.3 (L)   Sodium (mmol/L)  Date Value  05/31/2024 145  05/16/2014 142   Assessment  Bradley [name] is a 78 y.o. male presenting with influenza A and pressure injury to buttocks. PMH significant for recurrent UTI, recent right AKA, previous left AKA, chronic anemia, hypertension, stage IV sacral decubitus ulcer. Pharmacy has been consulted to monitor and replace electrolytes.  Diet: NPO MIVF: N/A Pertinent medications: None   Goal of Therapy: Electrolytes WNL   Plan:  Hypernatremia resolved K 3.4, Kcl 10 mEq IV q1h x 4 doses per PCCM Mg 1.8, magnesium  sulfate 2 g IV x 1 Phos 2.3, potassium phosphate  21 mmol IV x 1 Follow-up electrolytes with AM labs tomorrow  Thank you for allowing pharmacy to be a part of this patient's care.  [name] 05/31/2024 7:58 AM

## 2024-05-31 NOTE — Progress Notes (Signed)
 "                                                                                                                                                                                                          Daily Progress Note   Patient Name: Bradley [name]       Date: 05/31/2024 DOB: 05/30/1946  Age: 78 y.o. MRN#: [id number] Attending Physician: Isadora Hose, MD Primary Care Physician: Alla Amis, MD Admit Date: 05/28/2024  Reason for Consultation/Follow-up: Establishing goals of care  Subjective: Notes and labs reviewed.  Patient has been transition to DNR/DNI status.  Updates provided by CCM staff.  In to see patient.  No family at bedside.  Patient is very talkative today.  He discusses that he is widowed and has 3 children.  He discusses that his older sons are twins, and and they were around 80 years old when he was shot.  He discusses that his third son is much younger.    He discusses being a man of faith.  He discusses God's sovereignty in all things.  He discusses his life and his walk with Jesus.  Patient began discussing goals of care.  He states that if the [facility] is ready to call him home, he is ready to go.  He states he is aware his time is limited.    He states he spoke with his son yesterday.  He discusses that he has already spoken with his son regarding which funeral home to call.  He states he has also discussed what suit he would want to wear.  He states he has discussed insurance policies with his son.  He states he does not want to die in the hospital, but would like to die at home with his sons by his side.  He states he does not want to return to the hospital.  He confirms DNR and DNI status.  We discussed continued aggressive path versus comfort path.  He declines for me to call his sons and states he will call and speak with his son this evening regarding an option of home with hospice.  Discussed that PMT would follow-up with patient tomorrow.  CCM and ID  updated.   Length of Stay: 3  Current Medications: Scheduled Meds:   sodium chloride    Intravenous Once   ascorbic acid   500 mg Oral BID   Chlorhexidine  Gluconate Cloth  6 each Topical Daily   enoxaparin  (LOVENOX ) injection  40 mg Subcutaneous QHS   feeding supplement  237 mL Oral TID BM  midodrine   10 mg Oral TID WC   multivitamin  1 tablet Oral Daily   mupirocin  ointment  1 Application Nasal BID   oseltamivir   75 mg Oral BID   pantoprazole   40 mg Oral Daily   sodium chloride  flush  10-40 mL Intracatheter Q12H   zinc  sulfate (50mg  elemental zinc )  220 mg Oral Daily    Continuous Infusions:  dextrose  5% lactated ringers  50 mL/hr at 05/31/24 1200   linezolid  (ZYVOX ) IV 300 mL/hr at 05/31/24 1100   meropenem  (MERREM ) 1 g in sodium chloride  0.9 % 100 mL IVPB 1 g (05/31/24 1523)   potassium PHOSPHATE  IVPB (in mmol) 85 mL/hr at 05/31/24 1200    PRN Meds: acetaminophen , dextrose , docusate sodium , morphine  injection, mouth rinse, polyethylene glycol, sodium chloride  flush  Physical Exam Pulmonary:     Effort: Pulmonary effort is normal.  Skin:    General: Skin is warm and dry.  Neurological:     Mental Status: He is alert.             Vital Signs: BP (!) 89/53   Pulse 87   Temp 98.4 F (36.9 C) (Oral)   Resp 16   Ht 6' 2 (1.88 m)   Wt 67.6 kg   SpO2 100%   BMI 19.13 kg/m  SpO2: SpO2: 100 % O2 Device: O2 Device: Room Air O2 Flow Rate: O2 Flow Rate (L/min): 2 L/min  Intake/output summary:  Intake/Output Summary (Last 24 hours) at 05/31/2024 1538 Last data filed at 05/31/2024 1231 Gross per 24 hour  Intake 4816.21 ml  Output 2425 ml  Net 2391.21 ml   LBM: Last BM Date : 05/31/24 Baseline Weight: Weight: 69.7 kg Most recent weight: Weight: 67.6 kg   Patient Active Problem List   Diagnosis Date Noted   Influenza A 05/28/2024   Sacral decubitus ulcer, stage IV (HCC) 04/10/2024   Anemia of chronic disease 04/01/2024   Bacteremia due to Clostridium species  04/01/2024   Bacteremia due to vancomycin  resistant Enterococcus 04/01/2024   Bacteremia due to Escherichia coli 03/27/2024   Pressure injury of skin 03/27/2024   Sepsis due to Enterococcus (HCC) 03/27/2024   Wound infection 03/26/2024   Septic shock (HCC) 03/26/2024   Microcytosis 01/09/2024   Reactive thrombocytosis 01/09/2024   S/P AKA (above knee amputation), left (HCC) 09/13/2023   Acute osteomyelitis of left calcaneus (HCC) 08/07/2023   Acute cystitis without hematuria 05/04/2023   Pressure injury of sacral region, stage 4 (HCC) 05/04/2023   Sacral osteomyelitis (HCC) 05/04/2023   Osteomyelitis of pelvic region or thigh, acute, left (HCC) 01/26/2023   HTN (hypertension) 01/26/2023   Hypokalemia 01/26/2023   SIRS (systemic inflammatory response syndrome) (HCC)    Neurogenic bladder 08/21/2018   Urinary tract infection associated with indwelling urethral catheter 08/21/2018   Hx of spinal cord injury 08/21/2018   Sacral wound 06/01/2018   Osteomyelitis (HCC) 06/01/2018   Paraplegia (HCC) 06/01/2018   Iron  deficiency anemia 04/19/2017    Palliative Care Assessment & Plan   Recommendations/Plan: PMT will follow-up tomorrow. Patient plans to talk with his son regarding going home with hospice.  Code Status:    Code Status Orders  (From admission, onward)           Start     Ordered   05/29/24 0852  Do not attempt resuscitation (DNR)- Limited -Do Not Intubate (DNI)  (Code Status)  Continuous       Question Answer Comment  If pulseless and not breathing No  CPR or chest compressions.   In Pre-Arrest Conditions (Patient Is Breathing and Has A Pulse) Do not intubate. Provide all appropriate non-invasive medical interventions. Avoid ICU transfer unless indicated or required.   Consent: Discussion documented in EHR or advanced directives reviewed      05/29/24 0851           Code Status History     Date Active Date Inactive Code Status Order ID Comments User  Context   05/28/2024 1936 05/29/2024 0851 Full Code [id number]  Rust-ChesterJenita CROME, NP ED   03/26/2024 1533 04/10/2024 2300 Full Code [id number]  Laurita Pillion, MD ED   05/04/2023 0427 05/08/2023 2120 Full Code [id number]  Sebastian Toribio GAILS, MD ED   01/26/2023 1836 02/03/2023 1800 Full Code [id number]  Hilma Rankins, MD ED   06/03/2019 0802 06/05/2019 2201 Full Code [id number]  Silvester Ales, MD ED   06/01/2018 2350 06/09/2018 0127 Full Code [id number]  Jenel Lenis, MD ED       Camelia Lewis, NP  Please contact Palliative Medicine Team phone at [phone] for questions and concerns.       "

## 2024-06-01 DIAGNOSIS — J101 Influenza due to other identified influenza virus with other respiratory manifestations: Secondary | ICD-10-CM | POA: Diagnosis not present

## 2024-06-01 DIAGNOSIS — Z7189 Other specified counseling: Secondary | ICD-10-CM | POA: Diagnosis not present

## 2024-06-01 LAB — TYPE AND SCREEN
ABO/RH(D): A NEG
Antibody Screen: NEGATIVE
Unit division: 0
Unit division: 0
Unit division: 0

## 2024-06-01 LAB — BASIC METABOLIC PANEL WITH GFR
Anion gap: 7 (ref 5–15)
BUN: <5 mg/dL — ABNORMAL LOW (ref 8–23)
CO2: 25 mmol/L (ref 22–32)
Calcium: 7.2 mg/dL — ABNORMAL LOW (ref 8.9–10.3)
Chloride: 111 mmol/L (ref 98–111)
Creatinine, Ser: 0.31 mg/dL — ABNORMAL LOW (ref 0.61–1.24)
GFR, Estimated: >60 mL/min
Glucose, Bld: 370 mg/dL — ABNORMAL HIGH (ref 70–99)
Potassium: 4 mmol/L (ref 3.5–5.1)
Sodium: 144 mmol/L (ref 135–145)

## 2024-06-01 LAB — CBC
HCT: 25.8 % — ABNORMAL LOW (ref 39.0–52.0)
Hemoglobin: 8.1 g/dL — ABNORMAL LOW (ref 13.0–17.0)
MCH: 26.5 pg (ref 26.0–34.0)
MCHC: 31.4 g/dL (ref 30.0–36.0)
MCV: 84.3 fL (ref 80.0–100.0)
Platelets: 162 K/uL (ref 150–400)
RBC: 3.06 MIL/uL — ABNORMAL LOW (ref 4.22–5.81)
RDW: 21.8 % — ABNORMAL HIGH (ref 11.5–15.5)
WBC: 6.7 K/uL (ref 4.0–10.5)
nRBC: 0 % (ref 0.0–0.2)

## 2024-06-01 LAB — BPAM RBC
Blood Product Expiration Date: 202512302359
Blood Product Expiration Date: 202601142359
Blood Product Expiration Date: 202601162359
ISSUE DATE / TIME: 202512241122
ISSUE DATE / TIME: 202512241431
ISSUE DATE / TIME: 202512260852
Unit Type and Rh: 600
Unit Type and Rh: 600
Unit Type and Rh: 600

## 2024-06-01 LAB — CULTURE, BLOOD (ROUTINE X 2)

## 2024-06-01 LAB — HEMOGLOBIN AND HEMATOCRIT, BLOOD
HCT: 25.2 % — ABNORMAL LOW (ref 39.0–52.0)
HCT: 25.4 % — ABNORMAL LOW (ref 39.0–52.0)
HCT: 28.3 % — ABNORMAL LOW (ref 39.0–52.0)
Hemoglobin: 8.1 g/dL — ABNORMAL LOW (ref 13.0–17.0)
Hemoglobin: 7.9 g/dL — ABNORMAL LOW (ref 13.0–17.0)
Hemoglobin: 9 g/dL — ABNORMAL LOW (ref 13.0–17.0)

## 2024-06-01 LAB — GLUCOSE, CAPILLARY
Glucose-Capillary: 66 mg/dL — ABNORMAL LOW (ref 70–99)
Glucose-Capillary: 71 mg/dL (ref 70–99)
Glucose-Capillary: 71 mg/dL (ref 70–99)
Glucose-Capillary: 85 mg/dL (ref 70–99)
Glucose-Capillary: 91 mg/dL (ref 70–99)
Glucose-Capillary: 93 mg/dL (ref 70–99)

## 2024-06-01 LAB — MAGNESIUM: Magnesium: 2 mg/dL (ref 1.7–2.4)

## 2024-06-01 LAB — PHOSPHORUS: Phosphorus: 2.9 mg/dL (ref 2.5–4.6)

## 2024-06-01 MED ORDER — VANCOMYCIN 50 MG/ML ORAL SOLUTION
125.0000 mg | Freq: Four times a day (QID) | ORAL | Status: DC
  Administered 2024-06-01 – 2024-06-02 (×4): 125 mg via ORAL
  Filled 2024-06-01 (×7): qty 2.5

## 2024-06-01 NOTE — Progress Notes (Signed)
 "                                                                                                                                                                                                          Daily Progress Note   Patient Name: Bradley [name]       Date: 06/01/2024 DOB: Oct 12, 1945  Age: 78 y.o. MRN#: [id number] Attending Physician: Al-Sultani, Anmar, MD Primary Care Physician: Alla Amis, MD Admit Date: 05/28/2024  Reason for Consultation/Follow-up: Establishing goals of care  Subjective: Notes and labs reviewed.  Chaplain note from last evening reviewed.  Spoke with nursing for updates.  In to see patient.  He discusses talking with his youngest son last night.  Patient discusses his faith in God and that he knows in his heart, his time is limited, and he will die.  Patient states he would like to be able to go home and be kept comfortable there with hospice. He discusses that his son was completely unaccepting of this, and discusses how his son emotionally handled his discussion regarding desire for end-of-life care.    He states after giving this a lot of thought, he is not currently in physical pain, and would like to honor his children's wishes to continue to treat the treatable.  He states if he begins to have pain or physical suffering, he will reconsider this.  He states he has lived his life, his time is short, and he wants his children to be happy.  Offered to have a family meeting, or speak with his children, and he thanks me for the offer, but he states he does not want this.  He states they have talked, and I do not want to keep beating them with this.  He states he is hopeful that they will come to a place of peace with his end-of-life process.  Length of Stay: 4  Current Medications: Scheduled Meds:   sodium chloride    Intravenous Once   ascorbic acid   500 mg Oral BID   Chlorhexidine  Gluconate Cloth  6 each Topical Daily   enoxaparin  (LOVENOX ) injection   40 mg Subcutaneous QHS   feeding supplement  237 mL Oral TID BM   midodrine   10 mg Oral TID WC   multivitamin  1 tablet Oral Daily   mupirocin  ointment  1 Application Nasal BID   oseltamivir   75 mg Oral BID   pantoprazole   40 mg Oral Daily   sodium chloride  flush  10-40 mL Intracatheter Q12H   zinc  sulfate (50mg  elemental zinc )  220  mg Oral Daily    Continuous Infusions:  dextrose  5% lactated ringers  50 mL/hr at 06/01/24 1426   meropenem  (MERREM ) 1 g in sodium chloride  0.9 % 100 mL IVPB 33.3 mL/hr at 06/01/24 1426    PRN Meds: acetaminophen , dextrose , docusate sodium , morphine  injection, mouth rinse, polyethylene glycol, sodium chloride  flush  Physical Exam Pulmonary:     Effort: Pulmonary effort is normal.  Skin:    General: Skin is warm and dry.  Neurological:     Mental Status: He is alert.             Vital Signs: BP (!) 109/50   Pulse 65   Temp 98.7 F (37.1 C) (Oral)   Resp 18   Ht 6' 2 (1.88 m)   Wt 67.6 kg   SpO2 99%   BMI 19.13 kg/m  SpO2: SpO2: 99 % O2 Device: O2 Device: Room Air O2 Flow Rate: O2 Flow Rate (L/min): 2 L/min  Intake/output summary:  Intake/Output Summary (Last 24 hours) at 06/01/2024 1449 Last data filed at 06/01/2024 1426 Gross per 24 hour  Intake 1792.86 ml  Output 1100 ml  Net 692.86 ml   LBM: Last BM Date : 06/01/24 Baseline Weight: Weight: 69.7 kg Most recent weight: Weight: 67.6 kg  Patient Active Problem List   Diagnosis Date Noted   Influenza A 05/28/2024   Sacral decubitus ulcer, stage IV (HCC) 04/10/2024   Anemia of chronic disease 04/01/2024   Bacteremia due to Clostridium species 04/01/2024   Bacteremia due to vancomycin  resistant Enterococcus 04/01/2024   Bacteremia due to Escherichia coli 03/27/2024   Pressure injury of skin 03/27/2024   Sepsis due to Enterococcus (HCC) 03/27/2024   Wound infection 03/26/2024   Septic shock (HCC) 03/26/2024   Microcytosis 01/09/2024   Reactive thrombocytosis 01/09/2024   S/P  AKA (above knee amputation), left (HCC) 09/13/2023   Acute osteomyelitis of left calcaneus (HCC) 08/07/2023   Acute cystitis without hematuria 05/04/2023   Pressure injury of sacral region, stage 4 (HCC) 05/04/2023   Sacral osteomyelitis (HCC) 05/04/2023   Osteomyelitis of pelvic region or thigh, acute, left (HCC) 01/26/2023   HTN (hypertension) 01/26/2023   Hypokalemia 01/26/2023   SIRS (systemic inflammatory response syndrome) (HCC)    Neurogenic bladder 08/21/2018   Urinary tract infection associated with indwelling urethral catheter 08/21/2018   Hx of spinal cord injury 08/21/2018   Sacral wound 06/01/2018   Osteomyelitis (HCC) 06/01/2018   Paraplegia (HCC) 06/01/2018   Iron  deficiency anemia 04/19/2017    Palliative Care Assessment & Plan   Recommendations/Plan: DNR/DNI.  Continue to treat the treatable. PMT will check in with patient in the next few days.  Code Status:    Code Status Orders  (From admission, onward)           Start     Ordered   05/29/24 0852  Do not attempt resuscitation (DNR)- Limited -Do Not Intubate (DNI)  (Code Status)  Continuous       Question Answer Comment  If pulseless and not breathing No CPR or chest compressions.   In Pre-Arrest Conditions (Patient Is Breathing and Has A Pulse) Do not intubate. Provide all appropriate non-invasive medical interventions. Avoid ICU transfer unless indicated or required.   Consent: Discussion documented in EHR or advanced directives reviewed      05/29/24 0851           Code Status History     Date Active Date Inactive Code Status Order ID Comments User Context  05/28/2024 1936 05/29/2024 0851 Full Code [id number]  Rust-Chester, Jenita CROME, NP ED   03/26/2024 1533 04/10/2024 2300 Full Code [id number]  Laurita Pillion, MD ED   05/04/2023 0427 05/08/2023 2120 Full Code [id number]  Sebastian Toribio GAILS, MD ED   01/26/2023 1836 02/03/2023 1800 Full Code [id number]  Hilma Rankins, MD ED   06/03/2019 0802 06/05/2019  2201 Full Code [id number]  Silvester Ales, MD ED   06/01/2018 2350 06/09/2018 0127 Full Code [id number]  Jenel Lenis, MD ED       Camelia Lewis, NP  Please contact Palliative Medicine Team phone at [phone] for questions and concerns.       "

## 2024-06-01 NOTE — Plan of Care (Signed)
" °  Problem: Education: Goal: Knowledge of General Education information will improve Description: Including pain rating scale, medication(s)/side effects and non-pharmacologic comfort measures Outcome: Progressing   Problem: Clinical Measurements: Goal: Ability to maintain clinical measurements within normal limits will improve Outcome: Progressing Goal: Respiratory complications will improve Outcome: Progressing Goal: Cardiovascular complication will be avoided Outcome: Progressing   Problem: Coping: Goal: Level of anxiety will decrease Outcome: Progressing   Problem: Elimination: Goal: Will not experience complications related to bowel motility Outcome: Progressing Goal: Will not experience complications related to urinary retention Outcome: Progressing   Problem: Pain Managment: Goal: General experience of comfort will improve and/or be controlled Outcome: Progressing   Problem: Safety: Goal: Ability to remain free from injury will improve Outcome: Progressing   Problem: Nutrition: Goal: Adequate nutrition will be maintained Outcome: Not Progressing   "

## 2024-06-01 NOTE — Progress Notes (Addendum)
 MD at bedside and aware of patient's low BP. Orders to maintain SBP at 90 or greater.

## 2024-06-01 NOTE — Progress Notes (Signed)
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 under the Clinical Laboratory Improvement Amendments CLIA as qualified to perform high complexity clinical laboratory testing.    MEROPENEM  Value in next row Sensitive      <=4 SENSITIVEThis is a modified FDA-approved test that has been validated and its performance characteristics determined by the reporting laboratory.  This laboratory is certified under the Clinical Laboratory Improvement  Amendments CLIA as qualified to perform high complexity clinical laboratory testing.    TRIMETH/SULFA Value in next row Sensitive      <=4 SENSITIVEThis is a modified FDA-approved test that has been validated and its performance characteristics determined by the reporting laboratory.  This laboratory is certified under the Clinical Laboratory Improvement Amendments CLIA as qualified to perform high complexity clinical laboratory testing.    AMPICILLIN /SULBACTAM Value in next row Sensitive      <=4 SENSITIVEThis is a modified FDA-approved test that has been validated and its performance characteristics determined by the reporting laboratory.  This laboratory is certified under the Clinical Laboratory Improvement Amendments CLIA as qualified to perform high complexity clinical laboratory testing.    PIP/TAZO Value in next row Sensitive      <=4 SENSITIVEThis is a modified FDA-approved test that has been validated and its performance characteristics determined by the reporting laboratory.  This laboratory is certified under the Clinical Laboratory Improvement Amendments CLIA as qualified to perform high complexity clinical laboratory testing.    * RARE PROTEUS MIRABILIS  C Difficile Quick Screen w PCR reflex     Status: Abnormal   Collection Time: 05/30/24  1:33 PM   Specimen: STOOL  Result Value Ref Range Status   C Diff antigen POSITIVE (A) NEGATIVE Final   C Diff toxin NEGATIVE NEGATIVE Final   C Diff interpretation Results are indeterminate. See PCR results.  Final    Comment: Performed at [facility], [address] [place], [place] [address]  C. Diff by PCR, Reflexed     Status: Abnormal   Collection Time: 05/30/24  1:33 PM  Result Value Ref Range Status   Toxigenic C. Difficile by PCR POSITIVE (A) NEGATIVE Final    Comment: Positive for toxigenic C. difficile with little to  no toxin production. Only treat if clinical presentation suggests symptomatic illness.   Hypervirulent  Strain PRESUMPTIVE NEGATIVE PRESUMPTIVE NEGATIVE Final    Comment: Performed at [facility], [address] [place], [place] [address]  Culture, blood (Routine X 2) w Reflex to ID Panel     Status: None (Preliminary result)   Collection Time: 05/31/24  8:40 AM   Specimen: BLOOD  Result Value Ref Range Status   Specimen Description BLOOD BLOOD LEFT HAND  Final   Special Requests   Final    BOTTLES DRAWN AEROBIC ONLY Blood Culture adequate volume   Culture   Final    NO GROWTH < 24 HOURS Performed at [facility], [address] [place], [place] [address]    Report Status PENDING  Incomplete  Culture, blood (Routine X 2) w Reflex to ID Panel     Status: None (Preliminary result)   Collection Time: 05/31/24  8:40 AM   Specimen: BLOOD  Result Value Ref Range Status   Specimen Description BLOOD BLOOD LEFT ARM  Final   Special Requests   Final    BOTTLES DRAWN AEROBIC AND ANAEROBIC Blood Culture adequate volume   Culture   Final    NO GROWTH < 24 HOURS Performed at [facility] [address], [place], [place] [address]    Report Status PENDING  Incomplete     Radiology Studies: No results found.  Scheduled Meds:  sodium chloride    Intravenous Once   ascorbic acid   500 mg Oral BID   Chlorhexidine  Gluconate Cloth  6 each Topical Daily   enoxaparin  (LOVENOX ) injection  40 mg Subcutaneous QHS   feeding supplement  237 mL Oral TID BM   midodrine   10 mg Oral TID WC   multivitamin  1 tablet Oral Daily   mupirocin  ointment  1 Application Nasal BID   oseltamivir   75 mg Oral BID   pantoprazole   40 mg Oral Daily   sodium chloride  flush  10-40 mL Intracatheter Q12H   vancomycin   125 mg Oral Q6H   zinc  sulfate (50mg  elemental zinc )  220 mg Oral Daily   Continuous Infusions:  dextrose  5% lactated ringers  50 mL/hr at 06/01/24 1426   meropenem  (MERREM ) 1 g in sodium chloride  0.9 % 100 mL IVPB 33.3 mL/hr at 06/01/24 1426     Unresulted Labs (From  admission, onward)     Start     Ordered   06/02/24 0500  Basic metabolic panel with GFR  Tomorrow morning,   R       Question:  Specimen collection method  Answer:  Unit=Unit collect   06/01/24 0703   06/02/24 0500  Magnesium   Tomorrow morning,   R       Question:  Specimen collection method  Answer:  Unit=Unit collect   06/01/24 0703   06/02/24 0500  Phosphorus  Tomorrow morning,   R       Question:  Specimen collection method  Answer:  Unit=Unit collect   06/01/24 0703   05/31/24 2000  Hemoglobin and hematocrit, blood  Now then every 6 hours,   TIMED     Question:  Specimen collection method  Answer:  Unit=Unit collect   05/31/24 1511             LOS:  LOS: 4 days   Time Spent: 55 minutes  Seena Ritacco Al-Sultani, MD [facility]  If 7PM-7AM, please contact night-coverage  06/01/2024, 6:46 PM      "

## 2024-06-01 NOTE — Progress Notes (Signed)
 PHARMACY CONSULT NOTE - ELECTROLYTES  Pharmacy Consult for Electrolyte Monitoring and Replacement   Recent Labs: Height: 6' 2 (188 cm) Weight: 67.6 kg (149 lb 0.5 oz) IBW/kg (Calculated) : 82.2 Estimated Creatinine Clearance: 70.4 mL/min (A) (by C-G formula based on SCr of 0.31 mg/dL (L)). Potassium (mmol/L)  Date Value  06/01/2024 4.0  05/16/2014 3.1 (L)   Magnesium  (mg/dL)  Date Value  87/72/7974 2.0  06/17/2011 2.3   Calcium (mg/dL)  Date Value  87/72/7974 7.2 (L)   Calcium, Total (mg/dL)  Date Value  87/88/7984 7.9 (L)   Albumin  (g/dL)  Date Value  87/75/7974 2.0 (L)  05/13/2014 2.5 (L)   Phosphorus (mg/dL)  Date Value  87/72/7974 2.9   Sodium (mmol/L)  Date Value  06/01/2024 144  05/16/2014 142   Corrected Ca: 8.8  Assessment  Bradley [name] is a 78 y.o. male presenting with influenza A and pressure injury to buttocks. PMH significant for recurrent UTI, recent right AKA, previous left AKA, chronic anemia, hypertension, stage IV sacral decubitus ulcer. Pharmacy has been consulted to monitor and replace electrolytes.  Diet: NPO MIVF: N/A Pertinent medications: None   Goal of Therapy: Electrolytes WNL  Plan:  Hypernatremia resolved No replacement warranted at this time.  Follow-up electrolytes with AM labs tomorrow  Thank you for allowing pharmacy to be a part of this patient's care.  Estill CHRISTELLA Lutes, PharmD, BCPS Clinical Pharmacist 06/01/2024 6:58 AM

## 2024-06-02 ENCOUNTER — Other Ambulatory Visit: Payer: Self-pay

## 2024-06-02 DIAGNOSIS — L8944 Pressure ulcer of contiguous site of back, buttock and hip, stage 4: Secondary | ICD-10-CM | POA: Diagnosis not present

## 2024-06-02 DIAGNOSIS — R7881 Bacteremia: Secondary | ICD-10-CM | POA: Diagnosis not present

## 2024-06-02 DIAGNOSIS — B9620 Unspecified Escherichia coli [E. coli] as the cause of diseases classified elsewhere: Secondary | ICD-10-CM | POA: Diagnosis not present

## 2024-06-02 DIAGNOSIS — J101 Influenza due to other identified influenza virus with other respiratory manifestations: Secondary | ICD-10-CM | POA: Diagnosis not present

## 2024-06-02 LAB — BASIC METABOLIC PANEL WITH GFR
Anion gap: 5 (ref 5–15)
BUN: <5 mg/dL — ABNORMAL LOW (ref 8–23)
CO2: 26 mmol/L (ref 22–32)
Calcium: 7.2 mg/dL — ABNORMAL LOW (ref 8.9–10.3)
Chloride: 110 mmol/L (ref 98–111)
Creatinine, Ser: <0.3 mg/dL — ABNORMAL LOW (ref 0.61–1.24)
Glucose, Bld: 268 mg/dL — ABNORMAL HIGH (ref 70–99)
Potassium: 4.1 mmol/L (ref 3.5–5.1)
Sodium: 141 mmol/L (ref 135–145)

## 2024-06-02 LAB — CBC
HCT: 27.5 % — ABNORMAL LOW (ref 39.0–52.0)
Hemoglobin: 8.5 g/dL — ABNORMAL LOW (ref 13.0–17.0)
MCH: 26.2 pg (ref 26.0–34.0)
MCHC: 30.9 g/dL (ref 30.0–36.0)
MCV: 84.9 fL (ref 80.0–100.0)
Platelets: 136 K/uL — ABNORMAL LOW (ref 150–400)
RBC: 3.24 MIL/uL — ABNORMAL LOW (ref 4.22–5.81)
RDW: 22.6 % — ABNORMAL HIGH (ref 11.5–15.5)
WBC: 8.2 K/uL (ref 4.0–10.5)
nRBC: 0 % (ref 0.0–0.2)

## 2024-06-02 LAB — GLUCOSE, CAPILLARY
Glucose-Capillary: 119 mg/dL — ABNORMAL HIGH (ref 70–99)
Glucose-Capillary: 55 mg/dL — ABNORMAL LOW (ref 70–99)
Glucose-Capillary: 59 mg/dL — ABNORMAL LOW (ref 70–99)
Glucose-Capillary: 59 mg/dL — ABNORMAL LOW (ref 70–99)
Glucose-Capillary: 61 mg/dL — ABNORMAL LOW (ref 70–99)
Glucose-Capillary: 63 mg/dL — ABNORMAL LOW (ref 70–99)
Glucose-Capillary: 67 mg/dL — ABNORMAL LOW (ref 70–99)
Glucose-Capillary: 74 mg/dL (ref 70–99)
Glucose-Capillary: 81 mg/dL (ref 70–99)
Glucose-Capillary: 82 mg/dL (ref 70–99)
Glucose-Capillary: 83 mg/dL (ref 70–99)
Glucose-Capillary: 85 mg/dL (ref 70–99)

## 2024-06-02 LAB — PHOSPHORUS: Phosphorus: 3.1 mg/dL (ref 2.5–4.6)

## 2024-06-02 LAB — MAGNESIUM: Magnesium: 2 mg/dL (ref 1.7–2.4)

## 2024-06-02 MED ORDER — DEXTROSE 50 % IV SOLN
12.5000 g | INTRAVENOUS | Status: AC
  Administered 2024-06-02: 12.5 g via INTRAVENOUS
  Filled 2024-06-02: qty 50

## 2024-06-02 MED ORDER — INFLUENZA VAC SPLIT HIGH-DOSE 0.5 ML IM SUSY
0.5000 mL | PREFILLED_SYRINGE | INTRAMUSCULAR | Status: DC | PRN
  Filled 2024-06-02: qty 0.5

## 2024-06-02 MED ORDER — ENOXAPARIN SODIUM 40 MG/0.4ML IJ SOSY
40.0000 mg | PREFILLED_SYRINGE | Freq: Every day | INTRAMUSCULAR | Status: DC
  Administered 2024-06-03 – 2024-06-12 (×10): 40 mg via SUBCUTANEOUS
  Filled 2024-06-02 (×3): qty 0.4

## 2024-06-02 MED ORDER — SODIUM CHLORIDE 0.9 % IV SOLN: INTRAVENOUS | Status: AC | PRN

## 2024-06-02 NOTE — Progress Notes (Signed)
 PHARMACY CONSULT NOTE - ELECTROLYTES  Pharmacy Consult for Electrolyte Monitoring and Replacement   Recent Labs: Height: 6' 2 (188 cm) Weight: 70.1 kg (154 lb 8.7 oz) IBW/kg (Calculated) : 82.2 CrCl cannot be calculated (This lab value cannot be used to calculate CrCl because it is not a number: <0.30). Potassium (mmol/L)  Date Value  06/02/2024 4.1  05/16/2014 3.1 (L)   Magnesium  (mg/dL)  Date Value  87/71/7974 2.0  06/17/2011 2.3   Calcium (mg/dL)  Date Value  87/71/7974 7.2 (L)   Calcium, Total (mg/dL)  Date Value  87/88/7984 7.9 (L)   Albumin  (g/dL)  Date Value  87/75/7974 2.0 (L)  05/13/2014 2.5 (L)   Phosphorus (mg/dL)  Date Value  87/71/7974 3.1   Sodium (mmol/L)  Date Value  06/02/2024 141  05/16/2014 142   Corrected Ca: 8.8  Assessment  Bradley [name] is a 78 y.o. male presenting with influenza A and pressure injury to buttocks. PMH significant for recurrent UTI, recent right AKA, previous left AKA, chronic anemia, hypertension, stage IV sacral decubitus ulcer. Pharmacy has been consulted to monitor and replace electrolytes.  Diet: NPO MIVF: N/A Pertinent medications: None   Goal of Therapy: Electrolytes WNL  Plan:  No replacement warranted at this time.  Follow-up electrolytes with AM labs tomorrow  Thank you for allowing pharmacy to be a part of this patient's care.  Estill CHRISTELLA Lutes, PharmD, BCPS Clinical Pharmacist 06/02/2024 7:14 AM

## 2024-06-02 NOTE — Progress Notes (Signed)
 Hypoglycemic Event  CBG: 59 mg/dl  Treatment: 4 oz juice/soda  Symptoms: None  Follow-up CBG: Time:2003 hours CBG Result:82  Possible Reasons for Event: Unknown  Comments/MD notified: Dr. Lawence Olita GORMAN Teresa, MSN, RN, CCRN
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
## 2024-06-02 NOTE — Progress Notes (Signed)
 "  Date of Admission:  05/28/2024      ID: Bradley [name] is a 78 y.o. male  Principal Problem:   Influenza A Active Problems:   Urinary tract infection associated with indwelling urethral catheter   Septic shock (HCC)   Bacteremia due to Escherichia coli    Subjective: Pt has been refusing care today as per his nurse  Medications:   sodium chloride    Intravenous Once   ascorbic acid   500 mg Oral BID   Chlorhexidine  Gluconate Cloth  6 each Topical Daily   feeding supplement  237 mL Oral TID BM   midodrine   10 mg Oral TID WC   multivitamin  1 tablet Oral Daily   mupirocin  ointment  1 Application Nasal BID   oseltamivir   75 mg Oral BID   pantoprazole   40 mg Oral Daily   sodium chloride  flush  10-40 mL Intracatheter Q12H   vancomycin   125 mg Oral Q6H   zinc  sulfate (50mg  elemental zinc )  220 mg Oral Daily    Objective: Vital signs in last 24 hours: Patient Vitals for the past 24 hrs:  BP Temp Temp src Pulse Resp SpO2 Weight  06/02/24 1900 (!) 107/45 -- -- -- (!) 21 -- --  06/02/24 1800 (!) 99/46 -- -- 89 16 (!) 84 % --  06/02/24 1706 -- 100 F (37.8 C) Oral -- 17 93 % --  06/02/24 1700 (!) 98/42 -- -- -- 14 -- --  06/02/24 1600 (!) 94/34 -- -- -- (!) 28 -- --  06/02/24 1500 (!) 74/64 -- -- 96 18 94 % --  06/02/24 1400 (!) 96/47 -- -- 68 14 (!) 73 % --  06/02/24 1300 (!) 112/45 -- -- -- 20 -- --  06/02/24 1200 (!) 101/49 -- -- -- 19 -- --  06/02/24 1100 (!) 98/47 -- -- 95 (!) 22 100 % --  06/02/24 1000 (!) 80/41 -- -- (!) 102 (!) 28 95 % --  06/02/24 0900 111/61 -- -- -- 16 -- --  06/02/24 0800 (!) 101/48 98.3 F (36.8 C) Oral 89 (!) 23 100 % --  06/02/24 0700 (!) 91/45 -- -- 90 (!) 21 99 % --  06/02/24 0500 (!) 97/42 -- -- 87 (!) 40 100 % 70.1 kg  06/02/24 0400 (!) 112/41 98.4 F (36.9 C) Axillary 66 (!) 22 98 % --  06/02/24 0300 (!) 96/49 -- -- -- (!) 21 -- --  06/02/24 0200 (!) 92/47 -- -- -- (!) 23 -- --  06/02/24 0100 (!) 115/48 -- -- -- 17 -- --  06/02/24  0000 (!) 116/45 -- -- 70 19 96 % --  06/01/24 2200 (!) 97/49 -- -- 78 19 95 % --  06/01/24 2100 (!) 94/33 -- -- 74 20 100 % --  06/01/24 2000 (!) 114/99 98.5 F (36.9 C) Oral 68 17 98 % --      PHYSICAL EXAM:  General: sleeping.  Lungs: Clear to auscultation bilaterally. No Wheezing or Rhonchi. No rales. Heart: Regular rate and rhythm, no murmur, rub or gallop. Abdomen: Soft, colostomy Back-pictures reviewed       Neurologic: paraplegia  Lab Results    Latest Ref Rng & Units 06/02/2024    3:51 AM 06/01/2024    2:24 PM 06/01/2024    8:44 AM  CBC  WBC 4.0 - 10.5 K/uL 8.2     Hemoglobin 13.0 - 17.0 g/dL 8.5  9.0  7.9   Hematocrit 39.0 - 52.0 % 27.5  28.3  25.4   Platelets 150 - 400 K/uL 136          Latest Ref Rng & Units 06/02/2024    3:51 AM 06/01/2024    5:09 AM 05/31/2024    5:09 AM  CMP  Glucose 70 - 99 mg/dL 731  629  71   BUN 8 - 23 mg/dL <5  <5  <5   Creatinine 0.61 - 1.24 mg/dL <9.69  9.68  9.68   Sodium 135 - 145 mmol/L 141  144  145   Potassium 3.5 - 5.1 mmol/L 4.1  4.0  3.4   Chloride 98 - 111 mmol/L 110  111  110   CO2 22 - 32 mmol/L 26  25  28    Calcium 8.9 - 10.3 mg/dL 7.2  7.2  7.4       Microbiology: 05/28/24 bith sets ESBl Ecoli 12/24 WC  ECOLI PROTEUS VRE ( no need to treat this now) 05/31/24 BC - NG   Assessment/Plan: Complicated chronic sacral and ischial decubitus ulcers with ongoing infection with necrotic tissue and underlying chronic osteomyelitis and septic shock Status post bedside debridement. The chances of healing is unlikely. May discuss with plastic surgeon Dr. Lowery though when admitted at [facility] plastic surgery was not an option     ESBL E. coli bacteremia with sepsis Source is the wound Wound cultures have been sent Patient is currently on meropenem  will continue for 2 weeks of Iv antibiotic Though it is a futile endeavor as the sacral and ischila wound is never going to heal     Acute flu illness on Tamiflu     Candida auris was present in a culture in [facility] which is not available But no current  systemic infection with this organism Hence discontinued echinocandin   Anemia secondary to the extensive wounds   Paraplegia   Recent Clostridium septicum bacteremia and VRE bacteremia was treated with appropriate antibiotics.    Pts wound is never going to be healed- the antibiotic will help with systemic symptoms and help clear sepsis but will not cure the wound Discussed with his nurse    "

## 2024-06-02 NOTE — Plan of Care (Signed)
 Patient refused several aspects of his care today; he did agree to having his sacral dressings changed this evening, with silver alginate, kerlix and ABD pads. Refused some of his oral meds, refused a bath and also refused to be turned. After dressing was changed he refused to be turned to either side, wanted to lie on his back. Pt is also eating poorly. No complaints of pain today. Visitors at bedside at this time. Low grade temp 100.0 this evening.   Problem: Education: Goal: Knowledge of General Education information will improve Description: Including pain rating scale, medication(s)/side effects and non-pharmacologic comfort measures Outcome: Progressing   Problem: Clinical Measurements: Goal: Ability to maintain clinical measurements within normal limits will improve Outcome: Progressing Goal: Will remain free from infection Outcome: Progressing Goal: Diagnostic test results will improve Outcome: Progressing Goal: Respiratory complications will improve Outcome: Progressing Goal: Cardiovascular complication will be avoided Outcome: Progressing   Problem: Activity: Goal: Risk for activity intolerance will decrease Outcome: Progressing   Problem: Nutrition: Goal: Adequate nutrition will be maintained Outcome: Not Progressing   Problem: Coping: Goal: Level of anxiety will decrease Outcome: Not Progressing   Problem: Elimination: Goal: Will not experience complications related to bowel motility Outcome: Progressing Goal: Will not experience complications related to urinary retention Outcome: Progressing   Problem: Pain Managment: Goal: General experience of comfort will improve and/or be controlled Outcome: Progressing   Problem: Safety: Goal: Ability to remain free from injury will improve Outcome: Progressing   Problem: Skin Integrity: Goal: Risk for impaired skin integrity will decrease Outcome: Progressing

## 2024-06-02 NOTE — Plan of Care (Signed)
 This patient remains on [facility]-ICU/SDU as of time of writing. The patient is AA+Ox4; however, he is declining many of the medical and nursing interventions ordered for him. For tonight, as of time of writing, the patient has declined his ordered Lovenox , Tamiflu , and Vitamin C ; declined assessment of wounds/wound care, declined Foley care, and has declined his nutritional supplements. Hypoglycemic event overnight (refer to my prior entry). The patient's admission profile was completed, as much as was possible, overnight by this RN. A DNR armband and a Yellow fall risk armband were applied to the patient overnight by this RN.  Problem: Education: Goal: Knowledge of General Education information will improve Description: Including pain rating scale, medication(s)/side effects and non-pharmacologic comfort measures Outcome: Not Progressing   Problem: Health Behavior/Discharge Planning: Goal: Ability to manage health-related needs will improve Outcome: Not Progressing   Problem: Clinical Measurements: Goal: Ability to maintain clinical measurements within normal limits will improve Outcome: Not Progressing Goal: Will remain free from infection Outcome: Not Progressing Goal: Diagnostic test results will improve Outcome: Not Progressing Goal: Respiratory complications will improve Outcome: Not Progressing Goal: Cardiovascular complication will be avoided Outcome: Not Progressing   Problem: Activity: Goal: Risk for activity intolerance will decrease Outcome: Not Progressing   Problem: Nutrition: Goal: Adequate nutrition will be maintained Outcome: Not Progressing   Problem: Coping: Goal: Level of anxiety will decrease Outcome: Not Progressing   Problem: Elimination: Goal: Will not experience complications related to bowel motility Outcome: Not Progressing Goal: Will not experience complications related to urinary retention Outcome: Not Progressing   Problem: Pain Managment: Goal:  General experience of comfort will improve and/or be controlled Outcome: Not Progressing   Problem: Safety: Goal: Ability to remain free from injury will improve Outcome: Not Progressing   Problem: Skin Integrity: Goal: Risk for impaired skin integrity will decrease Outcome: Not Progressing   Problem: Fluid Volume: Goal: Hemodynamic stability will improve Outcome: Not Progressing   Problem: Clinical Measurements: Goal: Diagnostic test results will improve Outcome: Not Progressing Goal: Signs and symptoms of infection will decrease Outcome: Not Progressing   Problem: Respiratory: Goal: Ability to maintain adequate ventilation will improve Outcome: Not Progressing   Problem: Education: Goal: Knowledge of ostomy care will improve Outcome: Not Progressing Goal: Understanding of discharge needs will improve Outcome: Not Progressing   Problem: Bowel/Gastric/Urinary: Goal: Gastrointestinal status for postoperative course will improve Outcome: Not Progressing   Problem: Coping: Goal: Coping ability will improve Outcome: Not Progressing   Problem: Fluid Volume: Goal: Ability to achieve a balanced intake and output will improve Outcome: Not Progressing   Problem: Health Behavior/Discharge Planning: Goal: Ability to manage health-related needs will improve Outcome: Not Progressing   Problem: Nutrition: Goal: Will attain and maintain optimal nutritional status will improve Outcome: Not Progressing   Problem: Clinical Measurements: Goal: Postoperative complications will be avoided or minimized Outcome: Not Progressing   Problem: Skin Integrity: Goal: Will show signs of wound healing Outcome: Not Progressing Goal: Risk for impaired skin integrity will decrease Outcome: Not Progressing

## 2024-06-03 ENCOUNTER — Other Ambulatory Visit (HOSPITAL_COMMUNITY): Payer: Self-pay

## 2024-06-03 ENCOUNTER — Encounter: Payer: Self-pay | Admitting: Oncology

## 2024-06-03 ENCOUNTER — Encounter: Admitting: Physician Assistant

## 2024-06-03 ENCOUNTER — Telehealth (HOSPITAL_COMMUNITY): Payer: Self-pay | Admitting: Pharmacy Technician

## 2024-06-03 DIAGNOSIS — R6521 Severe sepsis with septic shock: Secondary | ICD-10-CM | POA: Diagnosis not present

## 2024-06-03 DIAGNOSIS — J9601 Acute respiratory failure with hypoxia: Secondary | ICD-10-CM | POA: Diagnosis not present

## 2024-06-03 DIAGNOSIS — D509 Iron deficiency anemia, unspecified: Secondary | ICD-10-CM | POA: Diagnosis not present

## 2024-06-03 DIAGNOSIS — Z7189 Other specified counseling: Secondary | ICD-10-CM | POA: Diagnosis not present

## 2024-06-03 DIAGNOSIS — R7401 Elevation of levels of liver transaminase levels: Secondary | ICD-10-CM | POA: Diagnosis not present

## 2024-06-03 DIAGNOSIS — J09X2 Influenza due to identified novel influenza A virus with other respiratory manifestations: Secondary | ICD-10-CM | POA: Diagnosis not present

## 2024-06-03 DIAGNOSIS — L89154 Pressure ulcer of sacral region, stage 4: Secondary | ICD-10-CM | POA: Diagnosis not present

## 2024-06-03 DIAGNOSIS — J111 Influenza due to unidentified influenza virus with other respiratory manifestations: Secondary | ICD-10-CM

## 2024-06-03 DIAGNOSIS — J101 Influenza due to other identified influenza virus with other respiratory manifestations: Secondary | ICD-10-CM | POA: Diagnosis not present

## 2024-06-03 DIAGNOSIS — G8220 Paraplegia, unspecified: Secondary | ICD-10-CM | POA: Diagnosis not present

## 2024-06-03 DIAGNOSIS — E43 Unspecified severe protein-calorie malnutrition: Secondary | ICD-10-CM | POA: Diagnosis not present

## 2024-06-03 DIAGNOSIS — E7220 Disorder of urea cycle metabolism, unspecified: Secondary | ICD-10-CM

## 2024-06-03 DIAGNOSIS — L89159 Pressure ulcer of sacral region, unspecified stage: Secondary | ICD-10-CM | POA: Diagnosis not present

## 2024-06-03 DIAGNOSIS — B9620 Unspecified Escherichia coli [E. coli] as the cause of diseases classified elsewhere: Secondary | ICD-10-CM | POA: Diagnosis not present

## 2024-06-03 DIAGNOSIS — R7881 Bacteremia: Secondary | ICD-10-CM | POA: Diagnosis not present

## 2024-06-03 DIAGNOSIS — Z1612 Extended spectrum beta lactamase (ESBL) resistance: Secondary | ICD-10-CM | POA: Diagnosis not present

## 2024-06-03 DIAGNOSIS — A4151 Sepsis due to Escherichia coli [E. coli]: Secondary | ICD-10-CM | POA: Diagnosis not present

## 2024-06-03 LAB — AEROBIC/ANAEROBIC CULTURE W GRAM STAIN (SURGICAL/DEEP WOUND)

## 2024-06-03 LAB — BASIC METABOLIC PANEL WITH GFR
Anion gap: 6 (ref 5–15)
BUN: 5 mg/dL — ABNORMAL LOW (ref 8–23)
CO2: 25 mmol/L (ref 22–32)
Calcium: 7.2 mg/dL — ABNORMAL LOW (ref 8.9–10.3)
Chloride: 110 mmol/L (ref 98–111)
Creatinine, Ser: 0.31 mg/dL — ABNORMAL LOW (ref 0.61–1.24)
GFR, Estimated: >60 mL/min
Glucose, Bld: 121 mg/dL — ABNORMAL HIGH (ref 70–99)
Potassium: 3.8 mmol/L (ref 3.5–5.1)
Sodium: 141 mmol/L (ref 135–145)

## 2024-06-03 LAB — CBC
HCT: 27.6 % — ABNORMAL LOW (ref 39.0–52.0)
Hemoglobin: 8.6 g/dL — ABNORMAL LOW (ref 13.0–17.0)
MCH: 27 pg (ref 26.0–34.0)
MCHC: 31.2 g/dL (ref 30.0–36.0)
MCV: 86.5 fL (ref 80.0–100.0)
Platelets: 122 K/uL — ABNORMAL LOW (ref 150–400)
RBC: 3.19 MIL/uL — ABNORMAL LOW (ref 4.22–5.81)
RDW: 23.1 % — ABNORMAL HIGH (ref 11.5–15.5)
WBC: 9.8 K/uL (ref 4.0–10.5)
nRBC: 0 % (ref 0.0–0.2)

## 2024-06-03 LAB — GLUCOSE, CAPILLARY
Glucose-Capillary: 104 mg/dL — ABNORMAL HIGH (ref 70–99)
Glucose-Capillary: 116 mg/dL — ABNORMAL HIGH (ref 70–99)
Glucose-Capillary: 145 mg/dL — ABNORMAL HIGH (ref 70–99)
Glucose-Capillary: 151 mg/dL — ABNORMAL HIGH (ref 70–99)
Glucose-Capillary: 74 mg/dL (ref 70–99)
Glucose-Capillary: 97 mg/dL (ref 70–99)

## 2024-06-03 LAB — LACTIC ACID, PLASMA: Lactic Acid, Venous: 1.6 mmol/L (ref 0.5–1.9)

## 2024-06-03 MED ORDER — VANCOMYCIN HCL 125 MG PO CAPS
125.0000 mg | ORAL_CAPSULE | Freq: Four times a day (QID) | ORAL | Status: DC
  Administered 2024-06-03 – 2024-06-13 (×37): 125 mg via ORAL
  Filled 2024-06-03 (×41): qty 1

## 2024-06-03 MED ORDER — DEXTROSE 50 % IV SOLN
1.0000 | Freq: Once | INTRAVENOUS | Status: AC
  Administered 2024-06-03: 50 mL via INTRAVENOUS
  Filled 2024-06-03: qty 50

## 2024-06-03 MED ORDER — OSELTAMIVIR PHOSPHATE 75 MG PO CAPS
75.0000 mg | ORAL_CAPSULE | Freq: Two times a day (BID) | ORAL | Status: AC
  Administered 2024-06-03 – 2024-06-08 (×10): 75 mg via ORAL
  Filled 2024-06-03 (×11): qty 1

## 2024-06-03 MED ORDER — PANTOPRAZOLE SODIUM 40 MG IV SOLR
40.0000 mg | Freq: Every day | INTRAVENOUS | Status: DC
  Administered 2024-06-03 – 2024-06-05 (×3): 40 mg via INTRAVENOUS
  Filled 2024-06-03 (×3): qty 10

## 2024-06-03 MED ORDER — NOREPINEPHRINE 4 MG/250ML-% IV SOLN
0.0000 ug/min | INTRAVENOUS | Status: DC
  Administered 2024-06-03: 2 ug/min via INTRAVENOUS
  Administered 2024-06-03: 4 ug/min via INTRAVENOUS
  Administered 2024-06-04: 3 ug/min via INTRAVENOUS
  Filled 2024-06-03 (×3): qty 250

## 2024-06-03 MED ORDER — GUAIFENESIN-DM 100-10 MG/5ML PO SYRP
5.0000 mL | ORAL_SOLUTION | ORAL | Status: DC | PRN
  Administered 2024-06-03 – 2024-06-05 (×3): 5 mL via ORAL
  Filled 2024-06-03 (×4): qty 10

## 2024-06-03 MED ORDER — DEXTROSE 10 % IV SOLN: INTRAVENOUS | Status: DC

## 2024-06-03 MED ORDER — SODIUM CHLORIDE 0.9 % IV BOLUS
500.0000 mL | Freq: Once | INTRAVENOUS | Status: AC
  Administered 2024-06-03: 500 mL via INTRAVENOUS

## 2024-06-03 MED ORDER — ALBUMIN HUMAN 25 % IV SOLN
25.0000 g | Freq: Four times a day (QID) | INTRAVENOUS | Status: AC
  Administered 2024-06-03 – 2024-06-04 (×4): 25 g via INTRAVENOUS
  Filled 2024-06-03 (×4): qty 100

## 2024-06-03 NOTE — Progress Notes (Signed)
 "  Date of Admission:  05/28/2024      ID: Bradley [name] is a 78 y.o. male  Principal Problem:   Influenza A Active Problems:   Urinary tract infection associated with indwelling urethral catheter   Septic shock (HCC)   Bacteremia due to Escherichia coli    Subjective: Pt has been refusing oral meds- did not take tamiflu  Poor appetite  Medications:   ascorbic acid   500 mg Oral BID   Chlorhexidine  Gluconate Cloth  6 each Topical Daily   enoxaparin  (LOVENOX ) injection  40 mg Subcutaneous QHS   feeding supplement  237 mL Oral TID BM   midodrine   10 mg Oral TID WC   multivitamin  1 tablet Oral Daily   pantoprazole  (PROTONIX ) IV  40 mg Intravenous Daily   sodium chloride  flush  10-40 mL Intracatheter Q12H   vancomycin   125 mg Oral QID   zinc  sulfate (50mg  elemental zinc )  220 mg Oral Daily    Objective: Vital signs in last 24 hours: Patient Vitals for the past 24 hrs:  BP Temp Temp src Pulse Resp SpO2 Weight  06/03/24 1200 (!) 95/36 -- -- -- (!) 24 -- --  06/03/24 1100 (!) 110/54 -- -- -- 14 -- --  06/03/24 1000 120/78 -- -- -- 15 -- --  06/03/24 0900 (!) 115/41 -- -- 68 (!) 24 100 % --  06/03/24 0800 (!) 116/48 -- -- -- 17 -- --  06/03/24 0700 (!) 104/47 -- -- 67 (!) 23 91 % --  06/03/24 0645 114/65 -- -- -- 18 -- --  06/03/24 0630 100/86 -- -- -- 15 -- --  06/03/24 0615 (!) 101/37 -- -- 67 19 100 % --  06/03/24 0600 (!) 117/51 -- -- 72 (!) 21 100 % --  06/03/24 0545 (!) 114/49 -- -- -- (!) 23 -- --  06/03/24 0530 (!) 120/49 -- -- -- (!) 22 -- --  06/03/24 0515 125/61 -- -- -- 19 -- --  06/03/24 0500 (!) 115/49 -- -- 67 20 100 % 69.2 kg  06/03/24 0445 (!) 93/57 -- -- -- 18 -- --  06/03/24 0430 (!) 118/51 -- -- -- (!) 22 -- --  06/03/24 0415 (!) 113/45 -- -- 67 20 100 % --  06/03/24 0400 (!) 104/59 99.9 F (37.7 C) Axillary 74 (!) 24 97 % --  06/03/24 0345 (!) 109/44 -- -- -- 20 -- --  06/03/24 0330 (!) 123/55 -- -- -- (!) 24 -- --  06/03/24 0315 (!) 116/49 -- --  66 (!) 24 100 % --  06/03/24 0306 (!) 108/52 -- -- 66 20 100 % --  06/03/24 0303 (!) 112/47 -- -- 69 20 100 % --  06/03/24 0300 (!) 113/50 -- -- 66 16 100 % --  06/03/24 0255 (!) 85/45 -- -- -- 20 -- --  06/03/24 0245 (!) 80/36 -- -- -- (!) 23 -- --  06/03/24 0235 (!) 82/32 -- -- 97 20 99 % --  06/03/24 0230 (!) 83/35 -- -- -- 18 -- --  06/03/24 0215 (!) 92/41 -- -- -- (!) 23 -- --  06/03/24 0200 (!) 91/43 -- -- 93 (!) 23 100 % --  06/03/24 0145 (!) 91/43 -- -- -- (!) 22 -- --  06/03/24 0130 (!) 80/45 -- -- -- 18 -- --  06/03/24 0125 (!) 77/38 -- -- -- (!) 21 -- --  06/03/24 0124 -- -- -- -- 19 -- --  06/03/24 0120 (!) 76/47 -- -- MAGNUS  108 13 97 % --  06/03/24 0115 (!) 82/18 -- -- 93 19 (!) 80 % --  06/03/24 0100 (!) 85/52 99 F (37.2 C) Axillary 100 (!) 25 100 % --  06/03/24 0015 -- -- -- 96 16 100 % --  06/03/24 0000 (!) 101/36 (!) 101.8 F (38.8 C) Axillary 88 (!) 28 97 % --  06/02/24 2347 -- -- -- -- (!) 23 -- --  06/02/24 2300 (!) 104/43 -- -- -- (!) 26 100 % --  06/02/24 2200 (!) 108/40 -- -- 74 (!) 22 100 % --  06/02/24 2126 -- -- -- 83 17 100 % --  06/02/24 2115 (!) 106/40 -- -- 90 (!) 25 100 % --  06/02/24 2100 (!) 94/42 -- -- -- 19 -- --  06/02/24 2013 -- -- -- 79 (!) 21 100 % --  06/02/24 2000 (!) 118/44 100.3 F (37.9 C) Axillary 78 15 100 % --  06/02/24 1900 (!) 107/45 -- -- -- (!) 21 -- --  06/02/24 1800 (!) 99/46 -- -- 89 16 (!) 84 % --  06/02/24 1706 -- 100 F (37.8 C) Oral -- 17 93 % --  06/02/24 1700 (!) 98/42 -- -- -- 14 -- --  06/02/24 1600 (!) 94/34 -- -- -- (!) 28 -- --  06/02/24 1500 (!) 74/64 -- -- 96 18 94 % --      PHYSICAL EXAM:  General: awake and alert Lungs: b/l air entry Heart: Regular rate and rhythm, no murmur, rub or gallop. Abdomen: Soft, colostomy Back-pictures reviewed       Neurologic: paraplegia  Lab Results    Latest Ref Rng & Units 06/03/2024    4:50 AM 06/02/2024    3:51 AM 06/01/2024    2:24 PM  CBC  WBC 4.0 - 10.5  K/uL 9.8  8.2    Hemoglobin 13.0 - 17.0 g/dL 8.6  8.5  9.0   Hematocrit 39.0 - 52.0 % 27.6  27.5  28.3   Platelets 150 - 400 K/uL 122  136         Latest Ref Rng & Units 06/03/2024    4:50 AM 06/02/2024    3:51 AM 06/01/2024    5:09 AM  CMP  Glucose 70 - 99 mg/dL 878  731  629   BUN 8 - 23 mg/dL 5  <5  <5   Creatinine 0.61 - 1.24 mg/dL 9.68  <9.69  9.68   Sodium 135 - 145 mmol/L 141  141  144   Potassium 3.5 - 5.1 mmol/L 3.8  4.1  4.0   Chloride 98 - 111 mmol/L 110  110  111   CO2 22 - 32 mmol/L 25  26  25    Calcium 8.9 - 10.3 mg/dL 7.2  7.2  7.2       Microbiology: 05/28/24 bith sets ESBl Ecoli 12/24 WC  ECOLI PROTEUS VRE ( no need to treat this now) 05/31/24 BC - NG   Assessment/Plan: Complicated chronic sacral and ischial decubitus ulcers with ongoing infection with necrotic tissue and underlying chronic osteomyelitis and septic shock Status post bedside debridement. The chances of healing is unlikely. when admitted at [facility] plastic surgery was not an option     ESBL E. coli bacteremia with sepsis Source is the wound Wound cultures have been sent Patient is currently on meropenem  will continue for 2 weeks of Iv antibiotic 06/12/23 Though it is a futile endeavor as the sacral and ischial wound is never going to heal  Acute flu illness on Tamiflu - refusing tamiflu - asked him to take it- He was under the impression that it was given by IV   Candida auris was present in a culture in [facility] which is not available But no current  systemic infection with this organism Hence discontinued echinocandin   Anemia secondary to the extensive wounds   Paraplegia   Recent Clostridium septicum bacteremia and VRE bacteremia was treated with appropriate antibiotics.    Pts wounds never going to be healed- the antibiotic will help with systemic symptoms and help clear sepsis but will not cure the wound Discussed with his nurse-  To check temp orally and not axilllary  temp      "

## 2024-06-03 NOTE — Progress Notes (Signed)
 "                                                                                                                                                                                                          Daily Progress Note   Patient Name: Bradley [name]       Date: 06/03/2024 DOB: [birth year]  Age: 78 y.o. MRN#: [id number] Attending Physician: Malka Domino, MD Primary Care Physician: Alla Amis, MD Admit Date: 05/28/2024  Reason for Consultation/Follow-up: Establishing goals of care  Subjective: Notes and labs reviewed.  Spoke with nursing for updates.  In to see patient.  He is currently resting in bed at this time, no family at bedside.  We discussed his care moving forward. He discusses that he refused medications/care one time. He confirms DNR/DNI.  He states he is amenable to continuing all other care.    Length of Stay: 6  Current Medications: Scheduled Meds:   ascorbic acid   500 mg Oral BID   Chlorhexidine  Gluconate Cloth  6 each Topical Daily   enoxaparin  (LOVENOX ) injection  40 mg Subcutaneous QHS   feeding supplement  237 mL Oral TID BM   midodrine   10 mg Oral TID WC   multivitamin  1 tablet Oral Daily   pantoprazole   40 mg Oral Daily   sodium chloride  flush  10-40 mL Intracatheter Q12H   vancomycin   125 mg Oral QID   zinc  sulfate (50mg  elemental zinc )  220 mg Oral Daily    Continuous Infusions:  sodium chloride  Stopped (06/03/24 0602)   albumin  human 25 g (06/03/24 1112)   dextrose  80 mL/hr (06/03/24 1113)   meropenem  (MERREM ) 1 g in sodium chloride  0.9 % 100 mL IVPB 33.3 mL/hr at 06/03/24 0700   norepinephrine  (LEVOPHED ) Adult infusion 5 mcg/min (06/03/24 0700)    PRN Meds: sodium chloride , acetaminophen , dextrose , docusate sodium , Influenza vac split trivalent PF, morphine  injection, mouth rinse, polyethylene glycol, sodium chloride  flush  Physical Exam Pulmonary:     Effort: Pulmonary effort is normal.  Neurological:     Mental Status: He is  alert.             Vital Signs: BP (!) 115/41   Pulse 68   Temp 99.9 F (37.7 C) (Axillary)   Resp (!) 24   Ht 6' 2 (1.88 m)   Wt 69.2 kg   SpO2 100%   BMI 19.59 kg/m  SpO2: SpO2: 100 % O2 Device: O2 Device: Room Air O2 Flow Rate: O2 Flow Rate (L/min): 2 L/min  Intake/output summary:  Intake/Output Summary (Last 24 hours) at 06/03/2024 1220 Last data filed at 06/03/2024 0700 Gross per 24 hour  Intake 1835.81 ml  Output 1365 ml  Net 470.81 ml   LBM: Last BM Date : 06/02/24 Baseline Weight: Weight: 69.7 kg Most recent weight: Weight: 69.2 kg   Patient Active Problem List   Diagnosis Date Noted   Influenza A 05/28/2024   Sacral decubitus ulcer, stage IV (HCC) 04/10/2024   Anemia of chronic disease 04/01/2024   Bacteremia due to Clostridium species 04/01/2024   Bacteremia due to vancomycin  resistant Enterococcus 04/01/2024   Bacteremia due to Escherichia coli 03/27/2024   Pressure injury of skin 03/27/2024   Sepsis due to Enterococcus (HCC) 03/27/2024   Wound infection 03/26/2024   Septic shock (HCC) 03/26/2024   Microcytosis 01/09/2024   Reactive thrombocytosis 01/09/2024   S/P AKA (above knee amputation), left (HCC) 09/13/2023   Acute osteomyelitis of left calcaneus (HCC) 08/07/2023   Acute cystitis without hematuria 05/04/2023   Pressure injury of sacral region, stage 4 (HCC) 05/04/2023   Sacral osteomyelitis (HCC) 05/04/2023   Osteomyelitis of pelvic region or thigh, acute, left (HCC) 01/26/2023   HTN (hypertension) 01/26/2023   Hypokalemia 01/26/2023   SIRS (systemic inflammatory response syndrome) (HCC)    Neurogenic bladder 08/21/2018   Urinary tract infection associated with indwelling urethral catheter 08/21/2018   Hx of spinal cord injury 08/21/2018   Sacral wound 06/01/2018   Osteomyelitis (HCC) 06/01/2018   Paraplegia (HCC) 06/01/2018   Iron  deficiency anemia 04/19/2017    Palliative Care Assessment & Plan    Recommendations/Plan: DNR/DNI Continue to treat the treatable  Code Status:    Code Status Orders  (From admission, onward)           Start     Ordered   05/29/24 0852  Do not attempt resuscitation (DNR)- Limited -Do Not Intubate (DNI)  (Code Status)  Continuous       Question Answer Comment  If pulseless and not breathing No CPR or chest compressions.   In Pre-Arrest Conditions (Patient Is Breathing and Has A Pulse) Do not intubate. Provide all appropriate non-invasive medical interventions. Avoid ICU transfer unless indicated or required.   Consent: Discussion documented in EHR or advanced directives reviewed      05/29/24 0851           Code Status History     Date Active Date Inactive Code Status Order ID Comments User Context   05/28/2024 1936 05/29/2024 0851 Full Code [id number]  Rust-ChesterJenita CROME, NP ED   03/26/2024 1533 04/10/2024 2300 Full Code [id number]  Laurita Pillion, MD ED   05/04/2023 0427 05/08/2023 2120 Full Code [id number]  Sebastian Toribio GAILS, MD ED   01/26/2023 1836 02/03/2023 1800 Full Code [id number]  Hilma Rankins, MD ED   06/03/2019 0802 06/05/2019 2201 Full Code [id number]  Silvester Ales, MD ED   06/01/2018 2350 06/09/2018 0127 Full Code [id number]  Jenel Lenis, MD ED   Camelia Lewis, NP  Please contact Palliative Medicine Team phone at [phone] for questions and concerns.       "
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
## 2024-06-03 NOTE — Progress Notes (Signed)
 Hypoglycemic Event  CBG: 59 mg/dl  Treatment: D50 25 mL (12.5 gm)   IV D50 given as patient was unable to awake enough to take PO liquids.  Symptoms: AMS  Follow-up CBG: Time:0008 hours CBG Result:74 mg/dl  Possible Reasons for Event: Other: Sepsis  Comments/MD notified:Dr. Lawence Olita GORMAN Teresa, MSN, RN, CCRN

## 2024-06-03 NOTE — Telephone Encounter (Signed)
 Patient Product/process Development Scientist completed.    The patient is insured through [employer]. Patient has Medicare and is not eligible for a copay card, but may be able to apply for patient assistance or Medicare RX Payment Plan (Patient Must reach out to their plan, if eligible for payment plan), if available.    Ran test claim for vancomycin  125 mg and the current 10 day co-pay is $0.00.   This test claim was processed through [facility]- copay amounts may vary at other pharmacies due to pharmacy/plan contracts, or as the patient moves through the different stages of their insurance plan.     Reyes Sharps, CPHT Pharmacy Technician Patient Advocate Specialist Lead [facility] Patient Advocate Team Direct Number: [phone]  Fax: [phone]

## 2024-06-03 NOTE — Plan of Care (Signed)
 Patient remains on Levophed . Midodrine  ordered. Attempts made to offer PO medications crushed and or while, with water, juice or any other other food he may prefer. Patient refused. Patient is states I just want to be left alone. Patient remains on 4mcg levophed . Educated on use of midodrine  to help decrease levophed  dose. Patient verbalized understanding. Did not want to participate at this time.    Problem: Clinical Measurements: Goal: Respiratory complications will improve Outcome: Progressing   Problem: Coping: Goal: Level of anxiety will decrease Outcome: Progressing   Problem: Elimination: Goal: Will not experience complications related to urinary retention Outcome: Progressing
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
## 2024-06-03 NOTE — Progress Notes (Signed)
 This nurse received message to return call to  [employer]  APS  -Social Work Supervisor [phone]  This nurse attempted call back. Generic message left with call back number (ICU direct line).
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
## 2024-06-04 DIAGNOSIS — A4151 Sepsis due to Escherichia coli [E. coli]: Secondary | ICD-10-CM | POA: Diagnosis not present

## 2024-06-04 DIAGNOSIS — J101 Influenza due to other identified influenza virus with other respiratory manifestations: Secondary | ICD-10-CM | POA: Diagnosis not present

## 2024-06-04 DIAGNOSIS — Z7189 Other specified counseling: Secondary | ICD-10-CM | POA: Diagnosis not present

## 2024-06-04 DIAGNOSIS — R6521 Severe sepsis with septic shock: Secondary | ICD-10-CM | POA: Diagnosis not present

## 2024-06-04 DIAGNOSIS — L89159 Pressure ulcer of sacral region, unspecified stage: Secondary | ICD-10-CM | POA: Diagnosis not present

## 2024-06-04 DIAGNOSIS — J9601 Acute respiratory failure with hypoxia: Secondary | ICD-10-CM | POA: Diagnosis not present

## 2024-06-04 LAB — BASIC METABOLIC PANEL WITH GFR
Anion gap: 6 (ref 5–15)
BUN: <5 mg/dL — ABNORMAL LOW (ref 8–23)
CO2: 27 mmol/L (ref 22–32)
Calcium: 7.7 mg/dL — ABNORMAL LOW (ref 8.9–10.3)
Chloride: 106 mmol/L (ref 98–111)
Creatinine, Ser: <0.3 mg/dL — ABNORMAL LOW (ref 0.61–1.24)
Glucose, Bld: 84 mg/dL (ref 70–99)
Potassium: 3 mmol/L — ABNORMAL LOW (ref 3.5–5.1)
Sodium: 140 mmol/L (ref 135–145)

## 2024-06-04 LAB — CBC
HCT: 22.7 % — ABNORMAL LOW (ref 39.0–52.0)
HCT: 28.6 % — ABNORMAL LOW (ref 39.0–52.0)
Hemoglobin: 7.1 g/dL — ABNORMAL LOW (ref 13.0–17.0)
Hemoglobin: 9.2 g/dL — ABNORMAL LOW (ref 13.0–17.0)
MCH: 26.9 pg (ref 26.0–34.0)
MCH: 27.4 pg (ref 26.0–34.0)
MCHC: 31.3 g/dL (ref 30.0–36.0)
MCHC: 32.2 g/dL (ref 30.0–36.0)
MCV: 86 fL (ref 80.0–100.0)
MCV: 85.1 fL (ref 80.0–100.0)
Platelets: 95 K/uL — ABNORMAL LOW (ref 150–400)
Platelets: 103 K/uL — ABNORMAL LOW (ref 150–400)
RBC: 2.64 MIL/uL — ABNORMAL LOW (ref 4.22–5.81)
RBC: 3.36 MIL/uL — ABNORMAL LOW (ref 4.22–5.81)
RDW: 22.8 % — ABNORMAL HIGH (ref 11.5–15.5)
RDW: 21 % — ABNORMAL HIGH (ref 11.5–15.5)
WBC: 5.2 K/uL (ref 4.0–10.5)
WBC: 6.7 K/uL (ref 4.0–10.5)
nRBC: 0 % (ref 0.0–0.2)
nRBC: 0 % (ref 0.0–0.2)

## 2024-06-04 LAB — PREPARE RBC (CROSSMATCH)

## 2024-06-04 LAB — GLUCOSE, CAPILLARY
Glucose-Capillary: 86 mg/dL (ref 70–99)
Glucose-Capillary: 105 mg/dL — ABNORMAL HIGH (ref 70–99)
Glucose-Capillary: 119 mg/dL — ABNORMAL HIGH (ref 70–99)
Glucose-Capillary: 132 mg/dL — ABNORMAL HIGH (ref 70–99)

## 2024-06-04 LAB — VITAMIN D 25 HYDROXY (VIT D DEFICIENCY, FRACTURES): Vit D, 25-Hydroxy: 15.9 ng/mL — ABNORMAL LOW (ref 30–100)

## 2024-06-04 LAB — PHOSPHORUS: Phosphorus: 2.5 mg/dL (ref 2.5–4.6)

## 2024-06-04 MED ORDER — POTASSIUM CHLORIDE 10 MEQ/100ML IV SOLN
10.0000 meq | INTRAVENOUS | Status: AC
  Administered 2024-06-04 (×4): 10 meq via INTRAVENOUS
  Filled 2024-06-04 (×4): qty 100

## 2024-06-04 MED ORDER — HYDROCORTISONE SOD SUC (PF) 100 MG IJ SOLR
50.0000 mg | Freq: Four times a day (QID) | INTRAMUSCULAR | Status: DC
  Administered 2024-06-04 – 2024-06-06 (×10): 50 mg via INTRAVENOUS
  Filled 2024-06-04 (×10): qty 2

## 2024-06-04 MED ORDER — VITAMIN D (ERGOCALCIFEROL) 1.25 MG (50000 UNIT) PO CAPS
50000.0000 [IU] | ORAL_CAPSULE | ORAL | Status: DC
  Administered 2024-06-12: 50000 [IU] via ORAL
  Filled 2024-06-04 (×2): qty 1

## 2024-06-04 MED ORDER — SODIUM CHLORIDE 0.9% IV SOLUTION: Freq: Once | INTRAVENOUS | Status: AC

## 2024-06-04 NOTE — Progress Notes (Addendum)
 "                                                                                                                                                                                                          Daily Progress Note   Patient Name: Bradley [name]       Date: 06/04/2024 DOB: 1945/12/24  Age: 78 y.o. MRN#: [id number] Attending Physician: Malka Domino, MD Primary Care Physician: Alla Amis, MD Admit Date: 05/28/2024  Reason for Consultation/Follow-up: Establishing goals of care  GOC: 12/27  Subjective: Epic chat received from RD regarding need for a feeding tube.  Notes and labs reviewed.  In to see patient.  He is currently resting in bed at this time.  Inquired how he was doing today and he just shook his head.    In previous conversations, patient stated he wanted to go home with hospice care, however when patient spoke with his son about this, patient stated his son was completely unreceptive to this idea, and the idea of comfort care overall.  Following that conversation with his son, he stated  he wanted to continue to treat the treatable while also continuing a DNR and DNI status as he desired to honor their wishes.    Attempted to discuss his status and prognosis.  Attempted to discuss his intake, and his thoughts on a feeding tube based on our previous discussions.  He states he is tired and ya'll are irritating me, and states he would like to be left alone so he can sleep.  He states this is new information that he will have to think about.  Inquired about calling his sons to discuss, and he states he does not want this.  Patient drifts back off to sleep.  Length of Stay: 7  Current Medications: Scheduled Meds:   ascorbic acid   500 mg Oral BID   Chlorhexidine  Gluconate Cloth  6 each Topical Daily   enoxaparin  (LOVENOX ) injection  40 mg Subcutaneous QHS   feeding supplement  237 mL Oral TID BM   hydrocortisone  sod succinate (SOLU-CORTEF ) inj  50 mg  Intravenous Q6H   midodrine   10 mg Oral TID WC   multivitamin  1 tablet Oral Daily   oseltamivir   75 mg Oral BID   pantoprazole  (PROTONIX ) IV  40 mg Intravenous Daily   sodium chloride  flush  10-40 mL Intracatheter Q12H   vancomycin   125 mg Oral QID   zinc  sulfate (50mg  elemental zinc )  220 mg Oral Daily    Continuous Infusions:  dextrose  80 mL/hr at  06/04/24 0721   meropenem  (MERREM ) 1 g in sodium chloride  0.9 % 100 mL IVPB 33.3 mL/hr at 06/04/24 9278   norepinephrine  (LEVOPHED ) Adult infusion 3 mcg/min (06/04/24 0750)   potassium chloride  10 mEq (06/04/24 1149)    PRN Meds: acetaminophen , dextrose , docusate sodium , guaiFENesin -dextromethorphan , Influenza vac split trivalent PF, mouth rinse, polyethylene glycol, sodium chloride  flush  Physical Exam Pulmonary:     Effort: Pulmonary effort is normal.  Skin:    General: Skin is warm and dry.  Neurological:     Mental Status: He is alert.             Vital Signs: BP (!) 118/57   Pulse 77   Temp 97.7 F (36.5 C) (Oral)   Resp 19   Ht 6' 2 (1.88 m)   Wt 74.5 kg   SpO2 96%   BMI 21.09 kg/m  SpO2: SpO2: 96 % O2 Device: O2 Device: Nasal Cannula O2 Flow Rate: O2 Flow Rate (L/min): 2 L/min  Intake/output summary:  Intake/Output Summary (Last 24 hours) at 06/04/2024 1205 Last data filed at 06/04/2024 1145 Gross per 24 hour  Intake 2444.67 ml  Output 4885 ml  Net -2440.33 ml   LBM: Last BM Date : 06/04/24 Baseline Weight: Weight: 69.7 kg Most recent weight: Weight: 74.5 kg   Patient Active Problem List   Diagnosis Date Noted   Influenza A 05/28/2024   Sacral decubitus ulcer, stage IV (HCC) 04/10/2024   Anemia of chronic disease 04/01/2024   Bacteremia due to Clostridium species 04/01/2024   Bacteremia due to vancomycin  resistant Enterococcus 04/01/2024   Bacteremia due to Escherichia coli 03/27/2024   Pressure injury of skin 03/27/2024   Sepsis due to Enterococcus (HCC) 03/27/2024   Wound  infection 03/26/2024   Septic shock (HCC) 03/26/2024   Microcytosis 01/09/2024   Reactive thrombocytosis 01/09/2024   S/P AKA (above knee amputation), left (HCC) 09/13/2023   Acute osteomyelitis of left calcaneus (HCC) 08/07/2023   Acute cystitis without hematuria 05/04/2023   Pressure injury of sacral region, stage 4 (HCC) 05/04/2023   Sacral osteomyelitis (HCC) 05/04/2023   Osteomyelitis of pelvic region or thigh, acute, left (HCC) 01/26/2023   HTN (hypertension) 01/26/2023   Hypokalemia 01/26/2023   SIRS (systemic inflammatory response syndrome) (HCC)    Neurogenic bladder 08/21/2018   Urinary tract infection associated with indwelling urethral catheter 08/21/2018   Hx of spinal cord injury 08/21/2018   Sacral wound 06/01/2018   Osteomyelitis (HCC) 06/01/2018   Paraplegia (HCC) 06/01/2018   Iron  deficiency anemia 04/19/2017    Code Status:    Code Status Orders  (From admission, onward)           Start     Ordered   05/29/24 0852  Do not attempt resuscitation (DNR)- Limited -Do Not Intubate (DNI)  (Code Status)  Continuous       Question Answer Comment  If pulseless and not breathing No CPR or chest compressions.   In Pre-Arrest Conditions (Patient Is Breathing and Has A Pulse) Do not intubate. Provide all appropriate non-invasive medical interventions. Avoid ICU transfer unless indicated or required.   Consent: Discussion documented in EHR or advanced directives reviewed      05/29/24 0851           Code Status History     Date Active Date Inactive Code Status Order ID Comments User Context   05/28/2024 1936 05/29/2024 0851 Full Code [id number]  Rust-ChesterJenita CROME, NP ED   03/26/2024 1533 04/10/2024 2300  Full Code [id number]  Laurita Pillion, MD ED   05/04/2023 0427 05/08/2023 2120 Full Code [id number]  Sebastian Toribio GAILS, MD ED   01/26/2023 1836 02/03/2023 1800 Full Code [id number]  Hilma Rankins, MD ED   06/03/2019 0802 06/05/2019 2201 Full Code  [id number]  Silvester Ales, MD ED   06/01/2018 2350 06/09/2018 0127 Full Code [id number]  Jenel Lenis, MD ED       Camelia Lewis, NP  Please contact Palliative Medicine Team phone at [phone] for questions and concerns.       "
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
## 2024-06-04 NOTE — TOC Progression Note (Signed)
 Transition of Care [facility]) - Progression Note    Patient Details  Name: Bradley [name] MRN: [id number] Date of Birth: [birth year]  [facility] CM/SW Contact  Bradley JINNY Ruts, [name] Phone Number: 06/04/2024, 12:40 PM  Clinical Narrative:    Chart reviewed. Received a message that APS worker has called the ICU floor to follow up with provider. The APS supervisor is Bradley [name] [phone]. I was able to speak with Bradley [name] and she informed me that the patient has an assigned APS worker by the name Bradley [name] [phone]. Bradley [name] reports that Bradley [name] will come meet with the patient later in the day.   12:40- I called Bradley [name] and left a message to inquire about patient safe D/C plan. I am waiting for a call back,.                    Expected Discharge Plan and Services                                               Social Drivers of Health (SDOH) Interventions SDOH Screenings   Food Insecurity: No Food Insecurity (05/29/2024)  Housing: Low Risk (05/29/2024)  Transportation Needs: No Transportation Needs (05/29/2024)  Utilities: Not At Risk (05/29/2024)  Depression (PHQ2-9): Low Risk (03/02/2023)  Financial Resource Strain: Low Risk (02/13/2024)   Received from [facility]  Social Connections: Unknown (06/02/2024)  Tobacco Use: Medium Risk (05/28/2024)    Readmission Risk Interventions    03/27/2024    3:44 PM  Readmission Risk Prevention Plan  Transportation Screening Complete  PCP or Specialist Appt within 3-5 Days Complete  HRI or Home Care Consult Complete  Social Work Consult for Recovery Care Planning/Counseling Complete  Palliative Care Screening Not Applicable  Medication Review Oceanographer) Complete
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
## 2024-06-04 NOTE — Plan of Care (Signed)
 Pt agreeable to take medication. Pt still refusing q2 turns at times.   Problem: Clinical Measurements: Goal: Diagnostic test results will improve Outcome: Progressing Goal: Respiratory complications will improve Outcome: Progressing Goal: Cardiovascular complication will be avoided Outcome: Progressing   Problem: Activity: Goal: Risk for activity intolerance will decrease Outcome: Not Progressing   Problem: Skin Integrity: Goal: Risk for impaired skin integrity will decrease Outcome: Not Progressing

## 2024-06-05 ENCOUNTER — Ambulatory Visit: Admitting: Physician Assistant

## 2024-06-05 DIAGNOSIS — R6521 Severe sepsis with septic shock: Secondary | ICD-10-CM | POA: Diagnosis not present

## 2024-06-05 DIAGNOSIS — Z1612 Extended spectrum beta lactamase (ESBL) resistance: Secondary | ICD-10-CM | POA: Diagnosis not present

## 2024-06-05 DIAGNOSIS — A4151 Sepsis due to Escherichia coli [E. coli]: Secondary | ICD-10-CM | POA: Diagnosis not present

## 2024-06-05 DIAGNOSIS — L89159 Pressure ulcer of sacral region, unspecified stage: Secondary | ICD-10-CM | POA: Diagnosis not present

## 2024-06-05 DIAGNOSIS — M4628 Osteomyelitis of vertebra, sacral and sacrococcygeal region: Secondary | ICD-10-CM | POA: Diagnosis not present

## 2024-06-05 DIAGNOSIS — J101 Influenza due to other identified influenza virus with other respiratory manifestations: Secondary | ICD-10-CM | POA: Diagnosis not present

## 2024-06-05 DIAGNOSIS — B9620 Unspecified Escherichia coli [E. coli] as the cause of diseases classified elsewhere: Secondary | ICD-10-CM | POA: Diagnosis not present

## 2024-06-05 DIAGNOSIS — J9601 Acute respiratory failure with hypoxia: Secondary | ICD-10-CM | POA: Diagnosis not present

## 2024-06-05 DIAGNOSIS — L8940 Pressure ulcer of contiguous site of back, buttock and hip, unspecified stage: Secondary | ICD-10-CM

## 2024-06-05 DIAGNOSIS — R7881 Bacteremia: Secondary | ICD-10-CM | POA: Diagnosis not present

## 2024-06-05 DIAGNOSIS — Z7189 Other specified counseling: Secondary | ICD-10-CM | POA: Diagnosis not present

## 2024-06-05 LAB — BASIC METABOLIC PANEL WITH GFR
Anion gap: 6 (ref 5–15)
BUN: <5 mg/dL — ABNORMAL LOW (ref 8–23)
CO2: 26 mmol/L (ref 22–32)
Calcium: 7.7 mg/dL — ABNORMAL LOW (ref 8.9–10.3)
Chloride: 105 mmol/L (ref 98–111)
Creatinine, Ser: <0.3 mg/dL — ABNORMAL LOW (ref 0.61–1.24)
Glucose, Bld: 133 mg/dL — ABNORMAL HIGH (ref 70–99)
Potassium: 3.1 mmol/L — ABNORMAL LOW (ref 3.5–5.1)
Sodium: 137 mmol/L (ref 135–145)

## 2024-06-05 LAB — CULTURE, BLOOD (ROUTINE X 2)
Culture: NO GROWTH
Culture: NO GROWTH
Special Requests: ADEQUATE
Special Requests: ADEQUATE

## 2024-06-05 LAB — TYPE AND SCREEN
ABO/RH(D): A NEG
Antibody Screen: NEGATIVE
Unit division: 0

## 2024-06-05 LAB — MAGNESIUM: Magnesium: 1.7 mg/dL (ref 1.7–2.4)

## 2024-06-05 LAB — CBC
HCT: 27.2 % — ABNORMAL LOW (ref 39.0–52.0)
Hemoglobin: 8.6 g/dL — ABNORMAL LOW (ref 13.0–17.0)
MCH: 26.9 pg (ref 26.0–34.0)
MCHC: 31.6 g/dL (ref 30.0–36.0)
MCV: 85 fL (ref 80.0–100.0)
Platelets: 102 K/uL — ABNORMAL LOW (ref 150–400)
RBC: 3.2 MIL/uL — ABNORMAL LOW (ref 4.22–5.81)
RDW: 21.2 % — ABNORMAL HIGH (ref 11.5–15.5)
WBC: 6.2 K/uL (ref 4.0–10.5)
nRBC: 0 % (ref 0.0–0.2)

## 2024-06-05 LAB — BPAM RBC
Blood Product Expiration Date: 202601012359
ISSUE DATE / TIME: 202512301133
Unit Type and Rh: 9500

## 2024-06-05 LAB — GLUCOSE, CAPILLARY
Glucose-Capillary: 140 mg/dL — ABNORMAL HIGH (ref 70–99)
Glucose-Capillary: 133 mg/dL — ABNORMAL HIGH (ref 70–99)
Glucose-Capillary: 108 mg/dL — ABNORMAL HIGH (ref 70–99)
Glucose-Capillary: 157 mg/dL — ABNORMAL HIGH (ref 70–99)
Glucose-Capillary: 129 mg/dL — ABNORMAL HIGH (ref 70–99)
Glucose-Capillary: 108 mg/dL — ABNORMAL HIGH (ref 70–99)
Glucose-Capillary: 146 mg/dL — ABNORMAL HIGH (ref 70–99)

## 2024-06-05 LAB — PHOSPHORUS: Phosphorus: 1.9 mg/dL — ABNORMAL LOW (ref 2.5–4.6)

## 2024-06-05 MED ORDER — PANTOPRAZOLE SODIUM 40 MG PO TBEC
40.0000 mg | DELAYED_RELEASE_TABLET | Freq: Every day | ORAL | Status: DC
  Administered 2024-06-06 – 2024-06-13 (×8): 40 mg via ORAL
  Filled 2024-06-05 (×8): qty 1

## 2024-06-05 MED ORDER — MIDODRINE HCL 5 MG PO TABS
15.0000 mg | ORAL_TABLET | Freq: Three times a day (TID) | ORAL | Status: DC
  Administered 2024-06-05 – 2024-06-11 (×20): 15 mg via ORAL
  Filled 2024-06-05 (×20): qty 3

## 2024-06-05 MED ORDER — MAGNESIUM SULFATE 2 GM/50ML IV SOLN
2.0000 g | Freq: Once | INTRAVENOUS | Status: AC
  Administered 2024-06-05: 2 g via INTRAVENOUS
  Filled 2024-06-05: qty 50

## 2024-06-05 MED ORDER — FLUDROCORTISONE ACETATE 0.1 MG PO TABS
0.1000 mg | ORAL_TABLET | Freq: Every day | ORAL | Status: DC
  Administered 2024-06-05 – 2024-06-09 (×5): 0.1 mg via ORAL
  Filled 2024-06-05 (×5): qty 1

## 2024-06-05 MED ORDER — LACTATED RINGERS IV BOLUS
500.0000 mL | Freq: Once | INTRAVENOUS | Status: AC
  Administered 2024-06-05: 500 mL via INTRAVENOUS

## 2024-06-05 MED ORDER — POTASSIUM CHLORIDE CRYS ER 20 MEQ PO TBCR
40.0000 meq | EXTENDED_RELEASE_TABLET | Freq: Once | ORAL | Status: DC
  Filled 2024-06-05: qty 2

## 2024-06-05 MED ORDER — POTASSIUM CHLORIDE 20 MEQ PO PACK
40.0000 meq | PACK | Freq: Once | ORAL | Status: AC
  Administered 2024-06-05: 40 meq via ORAL
  Filled 2024-06-05: qty 2

## 2024-06-05 MED ORDER — POTASSIUM CHLORIDE 10 MEQ/100ML IV SOLN
10.0000 meq | INTRAVENOUS | Status: AC
  Administered 2024-06-05 (×3): 10 meq via INTRAVENOUS
  Filled 2024-06-05 (×3): qty 100

## 2024-06-05 MED ORDER — POTASSIUM PHOSPHATES 15 MMOLE/5ML IV SOLN
21.0000 mmol | Freq: Once | INTRAVENOUS | Status: AC
  Administered 2024-06-05: 21 mmol via INTRAVENOUS
  Filled 2024-06-05: qty 7
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
## 2024-06-05 NOTE — Progress Notes (Addendum)
 "                                                                                                                                                                                                          Daily Progress Note   Patient Name: Bradley [name]       Date: 06/05/2024 DOB: [birth year]  Age: 78 y.o. MRN#: [id number] Attending Physician: Malka Domino, MD Primary Care Physician: Alla Amis, MD Admit Date: 05/28/2024  Reason for Consultation/Follow-up: Establishing goals of care  Subjective: Notes and labs reviewed.  Spoke with nursing regarding updates.  In to see patient.  He is currently resting in bed watching television.  He discusses that DSS came to speak with him yesterday, and we spoke about several different things.  He states he had been able to do things for himself, and in his current condition he would not be able to.    He discusses his last admission and that there were surgeries that were not performed here, that [facility] performed successfully.  He states upon leaving [facility], he went home and the instructions for dressing changes were every other day.  He states his son did the dressing changes but did not pack the wounds.  He states when the home health nurse came out to see him, the wound was wet and she called the doctor and the doctor advised to send him to the ED for evaluation, which initiated this admission.  He states ultimately that no person knows when a man will die, only God.  He states he knows his time is limited, but he is tired of thinking and talking about death.  He states he just wants staff to provide the care he needs.  He states that if there are questions regarding care that needs to be asked, he would like to simply be asked one time if he wants something or if he does not, and that be the end of the conversation.  He states he is tired of having similar conversations with multiple different people.  He states he just wants to try to be  happy and enjoy the days he has left.  With conversation, he would prefer to work exclusively with CCM or current primary team. We discussed that PMT will sign off, but that we can be reconsulted if he wishes in the future.  Length of Stay: 8  Current Medications: Scheduled Meds:   ascorbic acid   500 mg Oral BID   Chlorhexidine  Gluconate Cloth  6 each Topical Daily   enoxaparin  (LOVENOX ) injection  40 mg Subcutaneous QHS   feeding supplement  237 mL Oral TID BM   fludrocortisone  0.1 mg Oral Daily   hydrocortisone  sod succinate (SOLU-CORTEF ) inj  50 mg Intravenous Q6H   midodrine   15 mg Oral TID WC   multivitamin  1 tablet Oral Daily   oseltamivir   75 mg Oral BID   [START ON 06/06/2024] pantoprazole   40 mg Oral Daily   sodium chloride  flush  10-40 mL Intracatheter Q12H   vancomycin   125 mg Oral QID   Vitamin D (Ergocalciferol)  50,000 Units Oral Q7 days   zinc  sulfate (50mg  elemental zinc )  220 mg Oral Daily    Continuous Infusions:  dextrose  80 mL/hr at 06/05/24 0700   magnesium  sulfate bolus IVPB 2 g (06/05/24 1133)   meropenem  (MERREM ) 1 g in sodium chloride  0.9 % 100 mL IVPB 1 g (06/05/24 0701)   potassium chloride  10 mEq (06/05/24 1145)   potassium PHOSPHATE  IVPB (in mmol) 21 mmol (06/05/24 1153)    PRN Meds: acetaminophen , dextrose , docusate sodium , guaiFENesin -dextromethorphan , Influenza vac split trivalent PF, mouth rinse, polyethylene glycol, sodium chloride  flush  Physical Exam Pulmonary:     Effort: Pulmonary effort is normal.  Skin:    General: Skin is warm and dry.  Neurological:     Mental Status: He is alert.             Vital Signs: BP (!) 115/52   Pulse 72   Temp 97.8 F (36.6 C) (Oral)   Resp 15   Ht 6' 2 (1.88 m)   Wt 69.3 kg   SpO2 98%   BMI 19.62 kg/m  SpO2: SpO2: 98 % O2 Device: O2 Device: Nasal Cannula O2 Flow Rate: O2 Flow Rate (L/min): 2 L/min  Intake/output summary:  Intake/Output Summary (Last 24 hours) at 06/05/2024 1212 Last  data filed at 06/05/2024 0957 Gross per 24 hour  Intake 2686.16 ml  Output 2560 ml  Net 126.16 ml   LBM: Last BM Date : 06/04/24 Baseline Weight: Weight: 69.7 kg Most recent weight: Weight: 69.3 kg   Patient Active Problem List   Diagnosis Date Noted   Influenza A 05/28/2024   Sacral decubitus ulcer, stage IV (HCC) 04/10/2024   Anemia of chronic disease 04/01/2024   Bacteremia due to Clostridium species 04/01/2024   Bacteremia due to vancomycin  resistant Enterococcus 04/01/2024   Bacteremia due to Escherichia coli 03/27/2024   Pressure injury of skin 03/27/2024   Sepsis due to Enterococcus (HCC) 03/27/2024   Wound infection 03/26/2024   Septic shock (HCC) 03/26/2024   Microcytosis 01/09/2024   Reactive thrombocytosis 01/09/2024   S/P AKA (above knee amputation), left (HCC) 09/13/2023   Acute osteomyelitis of left calcaneus (HCC) 08/07/2023   Acute cystitis without hematuria 05/04/2023   Pressure injury of sacral region, stage 4 (HCC) 05/04/2023   Sacral osteomyelitis (HCC) 05/04/2023   Osteomyelitis of pelvic region or thigh, acute, left (HCC) 01/26/2023   HTN (hypertension) 01/26/2023   Hypokalemia 01/26/2023   SIRS (systemic inflammatory response syndrome) (HCC)    Neurogenic bladder 08/21/2018   Urinary tract infection associated with indwelling urethral catheter 08/21/2018   Hx of spinal cord injury 08/21/2018   Sacral wound 06/01/2018   Osteomyelitis (HCC) 06/01/2018   Paraplegia (HCC) 06/01/2018   Iron  deficiency anemia 04/19/2017    Palliative Care Assessment & Plan   Recommendations/Plan: Patient prefers to work exclusively with CCM or current primary team moving forward.  PMT will sign off as patient states he is tired  of having similar conversations with multiple people.  He is aware that PMT can be reconsulted if he wishes.  Code Status:    Code Status Orders  (From admission, onward)           Start     Ordered   05/29/24 0852  Do not attempt  resuscitation (DNR)- Limited -Do Not Intubate (DNI)  (Code Status)  Continuous       Question Answer Comment  If pulseless and not breathing No CPR or chest compressions.   In Pre-Arrest Conditions (Patient Is Breathing and Has A Pulse) Do not intubate. Provide all appropriate non-invasive medical interventions. Avoid ICU transfer unless indicated or required.   Consent: Discussion documented in EHR or advanced directives reviewed      05/29/24 0851           Code Status History     Date Active Date Inactive Code Status Order ID Comments User Context   05/28/2024 1936 05/29/2024 0851 Full Code [id number]  Rust-ChesterJenita CROME, NP ED   03/26/2024 1533 04/10/2024 2300 Full Code [id number]  Laurita Pillion, MD ED   05/04/2023 0427 05/08/2023 2120 Full Code [id number]  Sebastian Toribio GAILS, MD ED   01/26/2023 1836 02/03/2023 1800 Full Code [id number]  Hilma Rankins, MD ED   06/03/2019 0802 06/05/2019 2201 Full Code [id number]  Silvester Ales, MD ED   06/01/2018 2350 06/09/2018 0127 Full Code [id number]  Jenel Lenis, MD ED   Camelia Lewis, NP  Please contact Palliative Medicine Team phone at [phone] for questions and concerns.       "

## 2024-06-05 NOTE — Progress Notes (Signed)
 "  Date of Admission:  05/28/2024      ID: Bradley [name] is a 78 y.o. male  Principal Problem:   Influenza A Active Problems:   Urinary tract infection associated with indwelling urethral catheter   Septic shock (HCC)   Bacteremia due to Escherichia coli    Subjective: Pt feeling better Doing puzzles  Medications:   ascorbic acid   500 mg Oral BID   Chlorhexidine  Gluconate Cloth  6 each Topical Daily   enoxaparin  (LOVENOX ) injection  40 mg Subcutaneous QHS   feeding supplement  237 mL Oral TID BM   fludrocortisone  0.1 mg Oral Daily   hydrocortisone  sod succinate (SOLU-CORTEF ) inj  50 mg Intravenous Q6H   midodrine   15 mg Oral TID WC   multivitamin  1 tablet Oral Daily   oseltamivir   75 mg Oral BID   [START ON 06/06/2024] pantoprazole   40 mg Oral Daily   sodium chloride  flush  10-40 mL Intracatheter Q12H   vancomycin   125 mg Oral QID   Vitamin D (Ergocalciferol)  50,000 Units Oral Q7 days   zinc  sulfate (50mg  elemental zinc )  220 mg Oral Daily    Objective: Vital signs in last 24 hours: Patient Vitals for the past 24 hrs:  BP Temp Temp src Pulse Resp SpO2 Weight  06/05/24 1500 (!) 93/33 -- -- -- 18 -- --  06/05/24 1400 (!) 107/44 -- -- 65 14 99 % --  06/05/24 1300 (!) 105/44 -- -- 70 (!) 30 99 % --  06/05/24 1200 (!) 102/50 -- -- 84 (!) 23 97 % --  06/05/24 1100 (!) 101/44 -- -- 78 (!) 22 100 % --  06/05/24 1000 (!) 93/49 -- -- 66 20 98 % --  06/05/24 0900 (!) 109/48 -- -- 67 17 96 % --  06/05/24 0800 (!) 111/51 -- -- 66 16 98 % --  06/05/24 0700 (!) 115/52 -- -- 72 15 98 % --  06/05/24 0600 (!) 114/45 -- -- 74 17 99 % --  06/05/24 0500 (!) 88/44 -- -- 79 16 98 % 69.3 kg  06/05/24 0400 (!) 103/42 -- -- 67 17 100 % --  06/05/24 0300 (!) 97/43 -- -- 73 19 98 % --  06/05/24 0230 (!) 115/45 -- -- 82 16 94 % --  06/05/24 0200 (!) 84/42 -- -- 81 (!) 22 97 % --  06/05/24 0100 (!) 116/55 -- -- 64 16 97 % --  06/05/24 0006 -- -- -- 85 (!) 25 98 % --  06/05/24 0000 (!)  111/52 -- -- 66 16 98 % --  06/04/24 2300 (!) 112/49 -- -- 75 19 99 % --  06/04/24 2200 (!) 102/45 -- -- 72 17 100 % --  06/04/24 2100 (!) 106/41 -- -- 74 17 99 % --  06/04/24 2000 (!) 114/53 97.8 F (36.6 C) Oral 66 14 99 % --  06/04/24 1912 -- -- -- 77 18 99 % --  06/04/24 1900 (!) 98/45 -- -- 76 19 100 % --  06/04/24 1800 (!) 109/45 -- -- 72 18 100 % --  06/04/24 1725 (!) 108/45 -- -- 80 14 100 % --  06/04/24 1700 -- -- -- 68 17 99 % --  06/04/24 1600 (!) 99/39 97.7 F (36.5 C) Oral 72 15 98 % --      PHYSICAL EXAM:  General: awake and alert Lungs: b/l air entry Heart: Regular rate and rhythm, no murmur, rub or gallop. Abdomen: Soft, colostomy Back-pictures  reviewed       Neurologic: paraplegia  Lab Results    Latest Ref Rng & Units 06/05/2024    3:22 AM 06/04/2024    3:30 PM 06/04/2024    4:22 AM  CBC  WBC 4.0 - 10.5 K/uL 6.2  6.7  5.2   Hemoglobin 13.0 - 17.0 g/dL 8.6  9.2  7.1   Hematocrit 39.0 - 52.0 % 27.2  28.6  22.7   Platelets 150 - 400 K/uL 102  103  95        Latest Ref Rng & Units 06/05/2024    3:22 AM 06/04/2024    4:22 AM 06/03/2024    4:50 AM  CMP  Glucose 70 - 99 mg/dL 866  84  878   BUN 8 - 23 mg/dL <5  <5  5   Creatinine 0.61 - 1.24 mg/dL <9.69  <9.69  9.68   Sodium 135 - 145 mmol/L 137  140  141   Potassium 3.5 - 5.1 mmol/L 3.1  3.0  3.8   Chloride 98 - 111 mmol/L 105  106  110   CO2 22 - 32 mmol/L 26  27  25    Calcium 8.9 - 10.3 mg/dL 7.7  7.7  7.2       Microbiology: 05/28/24 bith sets ESBl Ecoli 12/24 WC  ECOLI PROTEUS VRE ( no need to treat this now) 05/31/24 BC - NG   Assessment/Plan: Complicated chronic sacral and ischial decubitus ulcers with ongoing infection with necrotic tissue and underlying chronic osteomyelitis and septic shock Status post bedside debridement. The chances of healing is unlikely. when admitted at [facility] plastic surgery was not an option     ESBL E. coli bacteremia with sepsis Source is the  wound Wound cultures have been sent Patient is currently on meropenem  will continue for 2 weeks of Iv antibiotic 06/12/23      Acute flu illness was on tamiflu    Candida auris was present in a culture in [facility] which is not available But no current  systemic infection with this organism Hence discontinued echinocandin   Anemia secondary to the extensive wounds   Paraplegia   Recent Clostridium septicum bacteremia and VRE bacteremia was treated with appropriate antibiotics.    Pts wounds never going to be healed- the antibiotic will help with systemic symptoms and help clear sepsis but will not cure the wound  Discussed with patient ID will sign off- call if needed       "

## 2024-06-06 DIAGNOSIS — J101 Influenza due to other identified influenza virus with other respiratory manifestations: Secondary | ICD-10-CM | POA: Diagnosis not present

## 2024-06-06 LAB — CBC
HCT: 29.7 % — ABNORMAL LOW (ref 39.0–52.0)
Hemoglobin: 9.5 g/dL — ABNORMAL LOW (ref 13.0–17.0)
MCH: 27.1 pg (ref 26.0–34.0)
MCHC: 32 g/dL (ref 30.0–36.0)
MCV: 84.9 fL (ref 80.0–100.0)
Platelets: 163 K/uL (ref 150–400)
RBC: 3.5 MIL/uL — ABNORMAL LOW (ref 4.22–5.81)
RDW: 21.8 % — ABNORMAL HIGH (ref 11.5–15.5)
WBC: 10.8 K/uL — ABNORMAL HIGH (ref 4.0–10.5)
nRBC: 0 % (ref 0.0–0.2)

## 2024-06-06 LAB — BASIC METABOLIC PANEL WITH GFR
Anion gap: 5 (ref 5–15)
BUN: <5 mg/dL — ABNORMAL LOW (ref 8–23)
CO2: 27 mmol/L (ref 22–32)
Calcium: 7.8 mg/dL — ABNORMAL LOW (ref 8.9–10.3)
Chloride: 108 mmol/L (ref 98–111)
Creatinine, Ser: 0.3 mg/dL — ABNORMAL LOW (ref 0.61–1.24)
GFR, Estimated: >60 mL/min
Glucose, Bld: 115 mg/dL — ABNORMAL HIGH (ref 70–99)
Potassium: 4.2 mmol/L (ref 3.5–5.1)
Sodium: 139 mmol/L (ref 135–145)

## 2024-06-06 LAB — GLUCOSE, CAPILLARY
Glucose-Capillary: 128 mg/dL — ABNORMAL HIGH (ref 70–99)
Glucose-Capillary: 99 mg/dL (ref 70–99)
Glucose-Capillary: 108 mg/dL — ABNORMAL HIGH (ref 70–99)
Glucose-Capillary: 118 mg/dL — ABNORMAL HIGH (ref 70–99)
Glucose-Capillary: 121 mg/dL — ABNORMAL HIGH (ref 70–99)

## 2024-06-06 LAB — PHOSPHORUS: Phosphorus: 1.6 mg/dL — ABNORMAL LOW (ref 2.5–4.6)

## 2024-06-06 LAB — VITAMIN E
Vitamin E (Alpha Tocopherol): 4.4 mg/L — ABNORMAL LOW (ref 9.0–29.0)
Vitamin E(Gamma Tocopherol): 0.8 mg/L (ref 0.5–4.9)

## 2024-06-06 LAB — MAGNESIUM: Magnesium: 2.2 mg/dL (ref 1.7–2.4)

## 2024-06-06 LAB — VITAMIN C: Vitamin C: 0.2 mg/dL — ABNORMAL LOW (ref 0.4–2.0)

## 2024-06-06 MED ORDER — SODIUM PHOSPHATES 45 MMOLE/15ML IV SOLN
45.0000 mmol | Freq: Once | INTRAVENOUS | Status: AC
  Administered 2024-06-06: 45 mmol via INTRAVENOUS
  Filled 2024-06-06: qty 15

## 2024-06-06 MED ORDER — HYDROCORTISONE SOD SUC (PF) 100 MG IJ SOLR
50.0000 mg | Freq: Two times a day (BID) | INTRAMUSCULAR | Status: AC
  Administered 2024-06-07 (×2): 50 mg via INTRAVENOUS
  Filled 2024-06-06 (×2): qty 1

## 2024-06-06 NOTE — Plan of Care (Signed)
  Problem: Education: Goal: Knowledge of General Education information will improve Description: Including pain rating scale, medication(s)/side effects and non-pharmacologic comfort measures Outcome: Progressing   Problem: Clinical Measurements: Goal: Ability to maintain clinical measurements within normal limits will improve Outcome: Progressing Goal: Diagnostic test results will improve Outcome: Progressing Goal: Cardiovascular complication will be avoided Outcome: Progressing   Problem: Safety: Goal: Ability to remain free from injury will improve Outcome: Progressing   
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
## 2024-06-06 NOTE — Plan of Care (Signed)
  Problem: Education: Goal: Knowledge of General Education information will improve Description: Including pain rating scale, medication(s)/side effects and non-pharmacologic comfort measures Outcome: Progressing   Problem: Health Behavior/Discharge Planning: Goal: Ability to manage health-related needs will improve Outcome: Progressing   Problem: Clinical Measurements: Goal: Respiratory complications will improve Outcome: Progressing Goal: Cardiovascular complication will be avoided Outcome: Progressing   Problem: Nutrition: Goal: Adequate nutrition will be maintained Outcome: Progressing   Problem: Pain Managment: Goal: General experience of comfort will improve and/or be controlled Outcome: Progressing   Problem: Safety: Goal: Ability to remain free from injury will improve Outcome: Progressing

## 2024-06-07 DIAGNOSIS — J101 Influenza due to other identified influenza virus with other respiratory manifestations: Secondary | ICD-10-CM | POA: Diagnosis not present

## 2024-06-07 LAB — GLUCOSE, CAPILLARY
Glucose-Capillary: 108 mg/dL — ABNORMAL HIGH (ref 70–99)
Glucose-Capillary: 108 mg/dL — ABNORMAL HIGH (ref 70–99)
Glucose-Capillary: 110 mg/dL — ABNORMAL HIGH (ref 70–99)
Glucose-Capillary: 114 mg/dL — ABNORMAL HIGH (ref 70–99)
Glucose-Capillary: 115 mg/dL — ABNORMAL HIGH (ref 70–99)
Glucose-Capillary: 118 mg/dL — ABNORMAL HIGH (ref 70–99)

## 2024-06-07 LAB — ZINC: Zinc: 83 ug/dL (ref 44–115)

## 2024-06-07 LAB — COPPER, SERUM: Copper: 58 ug/dL — ABNORMAL LOW (ref 69–132)

## 2024-06-07 MED ORDER — VITAMIN E 45 MG (100 UNIT) PO CAPS
400.0000 [IU] | ORAL_CAPSULE | Freq: Every day | ORAL | Status: DC
  Administered 2024-06-08 – 2024-06-13 (×6): 400 [IU] via ORAL
  Filled 2024-06-07 (×6): qty 4

## 2024-06-07 MED ORDER — ADULT MULTIVITAMIN W/MINERALS CH
1.0000 | ORAL_TABLET | Freq: Every day | ORAL | Status: DC
  Administered 2024-06-08 – 2024-06-13 (×6): 1 via ORAL
  Filled 2024-06-07 (×6): qty 1

## 2024-06-07 MED ORDER — THIAMINE HCL 100 MG PO TABS
100.0000 mg | ORAL_TABLET | Freq: Every day | ORAL | Status: DC
  Administered 2024-06-08 – 2024-06-13 (×6): 100 mg via ORAL
  Filled 2024-06-07 (×12): qty 1

## 2024-06-07 MED ORDER — COPPER 2 MG PO TABS
2.0000 mg | Freq: Every day | Status: DC
  Administered 2024-06-08 – 2024-06-13 (×6): 2 mg via ORAL
  Filled 2024-06-07 (×6): qty 1

## 2024-06-07 NOTE — TOC Initial Note (Signed)
 Transition of Care [facility]) - Initial/Assessment Note    Patient Details  Name: Bradley [name] MRN: [id number] Date of Birth: [birth year]  [facility] CM/SW Contact:    Bradley [name] Phone Number: 06/07/2024, 4:25 PM  Clinical Narrative:                  Bradley [name] met with patient in room 208 to discuss discharge planning. Pt reports an interest in transitioning to skilled nursing facility. [name]  A [name] also spk with pt son Bradley [name] to discharge to snf. Pt son reports an understanding of pt [name]. Pt son also reports home is not hazardous to patient to return home once he is discharged from [facility] and/or snf. [place] completed and referrals sent.        Patient Goals and CMS Choice            Expected Discharge [name] and Services                                              Prior Living Arrangements/Services                       Activities of Daily Living   ADL Screening (condition at time of admission) Independently performs ADLs?: No Does the patient have a NEW difficulty with bathing/dressing/toileting/self-feeding that is expected to last >3 days?: Yes (Initiates electronic notice to provider for possible OT consult) Does the patient have a NEW difficulty with getting in/out of bed, walking, or climbing stairs that is expected to last >3 days?: Yes (Initiates electronic notice to provider for possible PT consult) Does the patient have a NEW difficulty with communication that is expected to last >3 days?: Yes (Initiates electronic notice to provider for possible SLP consult) Is the patient deaf or have difficulty hearing?: No Does the patient have difficulty seeing, even when wearing glasses/contacts?: No Does the patient have difficulty concentrating, remembering, or making decisions?: Yes  Permission Sought/Granted                  Emotional Assessment              Admission  diagnosis:  Influenza A [J10.1] Septic shock (HCC) [A41.9, R65.21] Patient Active Problem List   Diagnosis Date Noted   Influenza A 05/28/2024   Sacral decubitus ulcer, stage IV (HCC) 04/10/2024   Anemia of chronic disease 04/01/2024   Bacteremia due to Clostridium species 04/01/2024   Bacteremia due to vancomycin  resistant Enterococcus 04/01/2024   Bacteremia due to Escherichia coli 03/27/2024   Pressure injury of skin 03/27/2024   Sepsis due to Enterococcus (HCC) 03/27/2024   Wound infection 03/26/2024   Septic shock (HCC) 03/26/2024   Microcytosis 01/09/2024   Reactive thrombocytosis 01/09/2024   S/P AKA (above knee amputation), left (HCC) 09/13/2023   Acute osteomyelitis of left calcaneus (HCC) 08/07/2023   Acute cystitis without hematuria 05/04/2023   Pressure injury of sacral region, stage 4 (HCC) 05/04/2023   Sacral osteomyelitis (HCC) 05/04/2023   Osteomyelitis of pelvic region or thigh, acute, left (HCC) 01/26/2023   HTN (hypertension) 01/26/2023   Hypokalemia 01/26/2023   SIRS (systemic inflammatory response syndrome) (HCC)    Neurogenic bladder 08/21/2018   Urinary tract infection associated with indwelling urethral catheter 08/21/2018   Hx of  spinal cord injury 08/21/2018   Sacral wound 06/01/2018   Osteomyelitis (HCC) 06/01/2018   Paraplegia (HCC) 06/01/2018   Iron  deficiency anemia 04/19/2017   PCP:  Alla Amis, MD Pharmacy:   [facility] [address] [place] - [address] [place] [address] Phone: [phone] Fax: [phone]     Social Drivers of Health (SDOH) Social History: SDOH Screenings   Food Insecurity: No Food Insecurity (05/29/2024)  Housing: Low Risk (05/29/2024)  Transportation Needs: No Transportation Needs (05/29/2024)  Utilities: Not At Risk (05/29/2024)  Depression (PHQ2-9): Low Risk (03/02/2023)  Financial Resource Strain: Low Risk (02/13/2024)   Received from [facility]  Social Connections:  Unknown (06/02/2024)  Tobacco Use: Medium Risk (05/28/2024)   SDOH Interventions:     Readmission Risk Interventions    03/27/2024    3:44 PM  Readmission Risk Prevention [name]  Transportation Screening Complete  PCP or Specialist Appt within 3-5 Days Complete  HRI or Home Care Consult Complete  Social Work Consult for Recovery Care Planning/Counseling Complete  Palliative Care Screening Not Applicable  Medication Review Oceanographer) Complete
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
## 2024-06-07 NOTE — NC FL2 (Signed)
 " [place]  MEDICAID FL2 LEVEL OF CARE FORM     IDENTIFICATION  Patient Name: Bradley [name] Birthdate: 1946/01/24 Sex: male Admission Date (Current Location): 05/28/2024  [facility] and [place] Number:  Chiropodist and Address:  [facility], [address] [place], [place] [address]      Provider Number: [id number]  Attending Physician Name and Address:  Kandis Devaughn Sayres, MD  Relative Name and Phone Number:  [place] son [phone]    Current Level of Care: Hospital Recommended Level of Care: Skilled Nursing Facility Prior Approval Number:    Date Approved/Denied:   PASRR Number: [id number]  Discharge Plan: SNF    Current Diagnoses: Patient Active Problem List   Diagnosis Date Noted   Influenza A 05/28/2024   Sacral decubitus ulcer, stage IV (HCC) 04/10/2024   Anemia of chronic disease 04/01/2024   Bacteremia due to Clostridium species 04/01/2024   Bacteremia due to vancomycin  resistant Enterococcus 04/01/2024   Bacteremia due to Escherichia coli 03/27/2024   Pressure injury of skin 03/27/2024   Sepsis due to Enterococcus (HCC) 03/27/2024   Wound infection 03/26/2024   Septic shock (HCC) 03/26/2024   Microcytosis 01/09/2024   Reactive thrombocytosis 01/09/2024   S/P AKA (above knee amputation), left (HCC) 09/13/2023   Acute osteomyelitis of left calcaneus (HCC) 08/07/2023   Acute cystitis without hematuria 05/04/2023   Pressure injury of sacral region, stage 4 (HCC) 05/04/2023   Sacral osteomyelitis (HCC) 05/04/2023   Osteomyelitis of pelvic region or thigh, acute, left (HCC) 01/26/2023   HTN (hypertension) 01/26/2023   Hypokalemia 01/26/2023   SIRS (systemic inflammatory response syndrome) (HCC)    Neurogenic bladder 08/21/2018   Urinary tract infection associated with indwelling urethral catheter 08/21/2018   Hx of spinal cord injury 08/21/2018   Sacral wound 06/01/2018   Osteomyelitis (HCC) 06/01/2018   Paraplegia  (HCC) 06/01/2018   Iron  deficiency anemia 04/19/2017    Orientation RESPIRATION BLADDER Height & Weight     Self  Normal Incontinent Weight: 158 lb 4.6 oz (71.8 kg) Height:  6' 2 (188 cm)  BEHAVIORAL SYMPTOMS/MOOD NEUROLOGICAL BOWEL NUTRITION STATUS      Continent Diet (Diet regular Room service appropriate? Yes; Fluid consistency: Thin: General starting at 12/23 2217)  AMBULATORY STATUS COMMUNICATION OF NEEDS Skin   Limited Assist Verbally Normal                       Personal Care Assistance Level of Assistance  Bathing, Feeding, Dressing Bathing Assistance: Maximum assistance Feeding assistance: Maximum assistance Dressing Assistance: Maximum assistance     Functional Limitations Info  Sight, Hearing, Speech Sight Info: Adequate Hearing Info: Adequate Speech Info: Adequate    SPECIAL CARE FACTORS FREQUENCY  PT (By licensed PT), OT (By licensed OT)     PT Frequency: 3x OT Frequency: 3x            Contractures      Additional Factors Info  Code Status Code Status Info: dnr limited             Current Medications (06/07/2024):  This is the current hospital active medication list Current Facility-Administered Medications  Medication Dose Route Frequency Provider Last Rate Last Admin   acetaminophen  (TYLENOL ) tablet 650 mg  650 mg Oral Q6H PRN Rust-Chester, Britton L, NP   650 mg at 06/03/24 0015   ascorbic acid  (VITAMIN C ) tablet 500 mg  500 mg Oral BID Rust-Chester, Jenita CROME, NP   500  mg at 06/07/24 9148   Chlorhexidine  Gluconate Cloth 2 % PADS 6 each  6 each Topical Daily Rust-Chester, Jenita CROME, NP   6 each at 06/07/24 1228   [START ON 06/08/2024] copper tablet 2 mg  2 mg Oral Daily Wouk, Devaughn Sayres, MD       dextrose  50 % solution 50 mL  50 mL Intravenous PRN Niu, Xilin, MD   50 mL at 06/02/24 0345   docusate sodium  (COLACE) capsule 100 mg  100 mg Oral BID PRN Rust-Chester, Britton L, NP       enoxaparin  (LOVENOX ) injection 40 mg  40 mg Subcutaneous  QHS Awanda City, MD   40 mg at 06/06/24 2123   feeding supplement (ENSURE PLUS HIGH PROTEIN) liquid 237 mL  237 mL Oral TID BM Isadora Hose, MD   237 mL at 06/07/24 0852   fludrocortisone (FLORINEF) tablet 0.1 mg  0.1 mg Oral Daily Assaker, Darrin, MD   0.1 mg at 06/07/24 0851   guaiFENesin -dextromethorphan  (ROBITUSSIN DM) 100-10 MG/5ML syrup 5 mL  5 mL Oral Q4H PRN Kathrene Almarie Bake, NP   5 mL at 06/05/24 0400   hydrocortisone  sodium succinate  (SOLU-CORTEF ) 100 MG injection 50 mg  50 mg Intravenous Q12H Wouk, Devaughn Sayres, MD   50 mg at 06/07/24 0243   Influenza vac split trivalent PF (FLUZONE  HIGH-DOSE) injection 0.5 mL  0.5 mL Intramuscular Prior to discharge Awanda City, MD       meropenem  (MERREM ) 1 g in sodium chloride  0.9 % 100 mL IVPB  1 g Intravenous Q8H Assaker, Darrin, MD 33.3 mL/hr at 06/07/24 1450 1 g at 06/07/24 1450   midodrine  (PROAMATINE ) tablet 15 mg  15 mg Oral TID WC Assaker, Darrin, MD   15 mg at 06/07/24 1448   [START ON 06/08/2024] multivitamin with minerals tablet 1 tablet  1 tablet Oral Daily Wouk, Devaughn Sayres, MD       Oral care mouth rinse  15 mL Mouth Rinse PRN Isadora Hose, MD       oseltamivir  (TAMIFLU ) capsule 75 mg  75 mg Oral BID Ouma, Elizabeth Achieng, NP   75 mg at 06/07/24 0851   pantoprazole  (PROTONIX ) EC tablet 40 mg  40 mg Oral Daily Chappell, Alex B, RPH   40 mg at 06/07/24 0851   polyethylene glycol (MIRALAX  / GLYCOLAX ) packet 17 g  17 g Oral Daily PRN Rust-Chester, Britton L, NP       sodium chloride  flush (NS) 0.9 % injection 10-40 mL  10-40 mL Intracatheter Q12H Dgayli, Hose, MD   10 mL at 06/07/24 0851   sodium chloride  flush (NS) 0.9 % injection 10-40 mL  10-40 mL Intracatheter PRN Isadora Hose, MD       [START ON 06/08/2024] thiamine (VITAMIN B1) tablet 100 mg  100 mg Oral Daily Wouk, Devaughn Sayres, MD       vancomycin  (VANCOCIN ) capsule 125 mg  125 mg Oral QID Sinclair, Emily S, RPH   125 mg at 06/07/24 1448   Vitamin D  (Ergocalciferol) (DRISDOL) 1.25 MG (50000 UNIT) capsule 50,000 Units  50,000 Units Oral Q7 days Assaker, Darrin, MD       NOREEN ON 06/08/2024] vitamin E capsule 400 Units  400 Units Oral Daily Wouk, Devaughn Sayres, MD         Discharge Medications: Please see discharge summary for a list of discharge medications.  Relevant Imaging Results:  Relevant Lab Results:   Additional Information [id number]  Alfonso Rummer, [name]     "

## 2024-06-07 NOTE — Consult Note (Signed)
 "                                   Vascular and Vein Specialist of   Patient name: Bradley [name] MRN: [id number] DOB: [birth year] Sex: male   REQUESTING PROVIDER:    Hospitalists   REASON FOR CONSULT:    Bilateral AKA  HISTORY OF PRESENT ILLNESS:   Bradley [name] is a 79 y.o. male, who is status post left AKA at [facility] in march of 2025 and right AKA at [facility] several months ago.  He reports no pain or drainage from his amputation sites  PAST MEDICAL HISTORY    Past Medical History:  Diagnosis Date   Anemia    Arthritis    Paralysis of both lower limbs (HCC)    Sacral decubitus ulcer      FAMILY HISTORY   Family History  Problem Relation Age of Onset   Breast cancer Mother     SOCIAL HISTORY:   Social History   Socioeconomic History   Marital status: Widowed    Spouse name: Not on file   Number of children: Not on file   Years of education: Not on file   Highest education level: Not on file  Occupational History   Not on file  Tobacco Use   Smoking status: Former    Current packs/day: 0.00    Average packs/day: 0.5 packs/day for 5.0 years (2.5 ttl pk-yrs)    Types: Cigarettes    Start date: 64    Quit date: 67    Years since quitting: 54.0   Smokeless tobacco: Never  Vaping Use   Vaping status: Never Used  Substance and Sexual Activity   Alcohol use: No   Drug use: No   Sexual activity: Not Currently  Other Topics Concern   Not on file  Social History Narrative   Not on file   Social Drivers of Health   Tobacco Use: Medium Risk (05/28/2024)   Patient History    Smoking Tobacco Use: Former    Smokeless Tobacco Use: Never    Passive Exposure: Not on file  Financial Resource Strain: Low Risk (02/13/2024)   Received from [facility]   Overall Financial Resource Strain (CARDIA)    How hard is it for you to pay for the very basics like food, housing, medical care, and heating?: Not very hard  Food Insecurity: No Food Insecurity  (05/29/2024)   Epic    Worried About Radiation Protection Practitioner of Food in the Last Year: Never true    Ran Out of Food in the Last Year: Never true  Transportation Needs: No Transportation Needs (05/29/2024)   Epic    Lack of Transportation (Medical): No    Lack of Transportation (Non-Medical): No  Physical Activity: Not on file  Stress: Not on file  Social Connections: Unknown (06/02/2024)   Social Connection and Isolation Panel    Frequency of Communication with Friends and Family: More than three times a week    Frequency of Social Gatherings with Friends and Family: More than three times a week    Attends Religious Services: Patient declined    Database Administrator or Organizations: Patient declined    Attends Banker Meetings: Patient declined    Marital Status: Widowed  Intimate Partner Violence: Not At Risk (05/29/2024)   Epic    Fear of Current or Ex-Partner: No  Emotionally Abused: No    Physically Abused: No    Sexually Abused: No  Depression (PHQ2-9): Low Risk (03/02/2023)   Depression (PHQ2-9)    PHQ-2 Score: 1  Alcohol Screen: Not on file  Housing: Low Risk (05/29/2024)   Epic    Unable to Pay for Housing in the Last Year: No    Number of Times Moved in the Last Year: 0    Homeless in the Last Year: No  Utilities: Not At Risk (05/29/2024)   Epic    Threatened with loss of utilities: No  Health Literacy: Not on file    ALLERGIES:    Allergies[1]  CURRENT MEDICATIONS:    Current Facility-Administered Medications  Medication Dose Route Frequency Provider Last Rate Last Admin   acetaminophen  (TYLENOL ) tablet 650 mg  650 mg Oral Q6H PRN Rust-Chester, Britton L, NP   650 mg at 06/03/24 0015   ascorbic acid  (VITAMIN C ) tablet 500 mg  500 mg Oral BID Rust-Chester, Britton L, NP   500 mg at 06/07/24 9148   Chlorhexidine  Gluconate Cloth 2 % PADS 6 each  6 each Topical Daily Rust-Chester, Jenita CROME, NP   6 each at 06/07/24 1228   [START ON 06/08/2024] copper  tablet 2 mg  2 mg Oral Daily Wouk, Devaughn Sayres, MD       dextrose  50 % solution 50 mL  50 mL Intravenous PRN Niu, Xilin, MD   50 mL at 06/02/24 0345   docusate sodium  (COLACE) capsule 100 mg  100 mg Oral BID PRN Rust-Chester, Britton L, NP       enoxaparin  (LOVENOX ) injection 40 mg  40 mg Subcutaneous QHS Awanda City, MD   40 mg at 06/06/24 2123   feeding supplement (ENSURE PLUS HIGH PROTEIN) liquid 237 mL  237 mL Oral TID BM Isadora Hose, MD   237 mL at 06/07/24 0852   fludrocortisone (FLORINEF) tablet 0.1 mg  0.1 mg Oral Daily Assaker, Darrin, MD   0.1 mg at 06/07/24 0851   guaiFENesin -dextromethorphan  (ROBITUSSIN DM) 100-10 MG/5ML syrup 5 mL  5 mL Oral Q4H PRN Kathrene Almarie Bake, NP   5 mL at 06/05/24 0400   hydrocortisone  sodium succinate  (SOLU-CORTEF ) 100 MG injection 50 mg  50 mg Intravenous Q12H Wouk, Devaughn Sayres, MD   50 mg at 06/07/24 0243   Influenza vac split trivalent PF (FLUZONE  HIGH-DOSE) injection 0.5 mL  0.5 mL Intramuscular Prior to discharge Awanda City, MD       meropenem  (MERREM ) 1 g in sodium chloride  0.9 % 100 mL IVPB  1 g Intravenous Q8H Assaker, Jean-Pierre, MD 33.3 mL/hr at 06/07/24 1450 1 g at 06/07/24 1450   midodrine  (PROAMATINE ) tablet 15 mg  15 mg Oral TID WC Assaker, Darrin, MD   15 mg at 06/07/24 1448   [START ON 06/08/2024] multivitamin with minerals tablet 1 tablet  1 tablet Oral Daily Wouk, Devaughn Sayres, MD       Oral care mouth rinse  15 mL Mouth Rinse PRN Isadora Hose, MD       oseltamivir  (TAMIFLU ) capsule 75 mg  75 mg Oral BID Ouma, Elizabeth Achieng, NP   75 mg at 06/07/24 0851   pantoprazole  (PROTONIX ) EC tablet 40 mg  40 mg Oral Daily Chappell, Alex B, RPH   40 mg at 06/07/24 0851   polyethylene glycol (MIRALAX  / GLYCOLAX ) packet 17 g  17 g Oral Daily PRN Rust-Chester, Jenita CROME, NP       sodium chloride  flush (NS) 0.9 %  injection 10-40 mL  10-40 mL Intracatheter Q12H Isadora Hose, MD   10 mL at 06/07/24 0851   sodium chloride  flush (NS) 0.9  % injection 10-40 mL  10-40 mL Intracatheter PRN Isadora Hose, MD       NOREEN ON 06/08/2024] thiamine (VITAMIN B1) tablet 100 mg  100 mg Oral Daily Wouk, Devaughn Sayres, MD       vancomycin  (VANCOCIN ) capsule 125 mg  125 mg Oral QID Bevely [name] RAMAN, RPH   125 mg at 06/07/24 1448   Vitamin D (Ergocalciferol) (DRISDOL) 1.25 MG (50000 UNIT) capsule 50,000 Units  50,000 Units Oral Q7 days Assaker, Darrin, MD       NOREEN ON 06/08/2024] vitamin E capsule 400 Units  400 Units Oral Daily Wouk, Devaughn Sayres, MD        REVIEW OF SYSTEMS:   [X]  denotes positive finding, [ ]  denotes negative finding Cardiac  Comments:  Chest pain or chest pressure:    Shortness of breath upon exertion:    Short of breath when lying flat:    Irregular heart rhythm:        Vascular    Pain in calf, thigh, or hip brought on by ambulation:    Pain in feet at night that wakes you up from your sleep:     Blood clot in your veins:    Leg swelling:         Pulmonary    Oxygen  at home:    Productive cough:     Wheezing:         Neurologic    Sudden weakness in arms or legs:     Sudden numbness in arms or legs:     Sudden onset of difficulty speaking or slurred speech:    Temporary loss of vision in one eye:     Problems with dizziness:         Gastrointestinal    Blood in stool:      Vomited blood:         Genitourinary    Burning when urinating:     Blood in urine:        Psychiatric    Major depression:         Hematologic    Bleeding problems:    Problems with blood clotting too easily:        Skin    Rashes or ulcers:        Constitutional    Fever or chills:     PHYSICAL EXAM:   Vitals:   06/06/24 1715 06/06/24 1800 06/06/24 2044 06/07/24 0841  BP:  123/62 (!) 121/55 (!) 112/47  Pulse: (!) 59 65 60 69  Resp: 16 18 16 19   Temp:   97.8 F (36.6 C) 97.7 F (36.5 C)  TempSrc:      SpO2: 99% 100% 100% 100%  Weight:      Height:        GENERAL: The patient is a well-nourished  male, in no acute distress. The vital signs are documented above. CARDIAC: There is a regular rate and rhythm.  VASCULAR: Bilateral AKA's healed nicely PULMONARY: Nonlabored respirations ABDOMEN: Soft and non-tender  MUSCULOSKELETAL: There are no major deformities or cyanosis. NEUROLOGIC: No focal weakness or paresthesias are detected. SKIN: There are no ulcers or rashes noted. PSYCHIATRIC: The patient has a normal affect.  STUDIES:   none  ASSESSMENT and PLAN   Healed bilateral AKA.  Staples remain on the right side.  These  can be removed by the nursing staff   Malvina Serene CLORE, MD, FACS [facility] [phone] Pager [phone]     [1]  Allergies Allergen Reactions   Lisinopril Cough   "
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
## 2024-06-08 DIAGNOSIS — J101 Influenza due to other identified influenza virus with other respiratory manifestations: Secondary | ICD-10-CM | POA: Diagnosis not present

## 2024-06-08 LAB — BASIC METABOLIC PANEL WITH GFR
Anion gap: 6 (ref 5–15)
BUN: 6 mg/dL — ABNORMAL LOW (ref 8–23)
CO2: 27 mmol/L (ref 22–32)
Calcium: 7.3 mg/dL — ABNORMAL LOW (ref 8.9–10.3)
Chloride: 110 mmol/L (ref 98–111)
Creatinine, Ser: 0.3 mg/dL — ABNORMAL LOW (ref 0.61–1.24)
GFR, Estimated: >60 mL/min
Glucose, Bld: 115 mg/dL — ABNORMAL HIGH (ref 70–99)
Potassium: 3.2 mmol/L — ABNORMAL LOW (ref 3.5–5.1)
Sodium: 143 mmol/L (ref 135–145)

## 2024-06-08 LAB — GLUCOSE, CAPILLARY
Glucose-Capillary: 168 mg/dL — ABNORMAL HIGH (ref 70–99)
Glucose-Capillary: 64 mg/dL — ABNORMAL LOW (ref 70–99)
Glucose-Capillary: 79 mg/dL (ref 70–99)
Glucose-Capillary: 82 mg/dL (ref 70–99)
Glucose-Capillary: 83 mg/dL (ref 70–99)
Glucose-Capillary: 89 mg/dL (ref 70–99)

## 2024-06-08 LAB — PHOSPHORUS: Phosphorus: <1 mg/dL — CL (ref 2.5–4.6)

## 2024-06-08 MED ORDER — POTASSIUM CHLORIDE CRYS ER 20 MEQ PO TBCR: 60.0000 meq | EXTENDED_RELEASE_TABLET | Freq: Once | ORAL | Status: DC

## 2024-06-08 MED ORDER — POTASSIUM PHOSPHATES 15 MMOLE/5ML IV SOLN
45.0000 mmol | Freq: Once | INTRAVENOUS | Status: AC
  Administered 2024-06-08: 45 mmol via INTRAVENOUS
  Filled 2024-06-08: qty 15

## 2024-06-08 MED ORDER — POTASSIUM CHLORIDE CRYS ER 20 MEQ PO TBCR
40.0000 meq | EXTENDED_RELEASE_TABLET | Freq: Once | ORAL | Status: AC
  Administered 2024-06-08: 40 meq via ORAL
  Filled 2024-06-08: qty 2

## 2024-06-08 NOTE — Progress Notes (Signed)
 Attempted to removed staples from patient's R AKA. Pt stated that he wanted to wait until tomorrow to have them removed. Pt had visitors throughout the day. Pt advised that staples needed to be removed according MD orders and part of the treatment plan. Pt verbalized understanding stating, Staples should be fine to be removed until tomorrow.  This nurse acknowledged patient's statement, MD notified
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
## 2024-06-08 NOTE — Plan of Care (Signed)
   Problem: Education: Goal: Knowledge of General Education information will improve Description Including pain rating scale, medication(s)/side effects and non-pharmacologic comfort measures Outcome: Progressing

## 2024-06-09 DIAGNOSIS — J101 Influenza due to other identified influenza virus with other respiratory manifestations: Secondary | ICD-10-CM | POA: Diagnosis not present

## 2024-06-09 LAB — GLUCOSE, CAPILLARY
Glucose-Capillary: 101 mg/dL — ABNORMAL HIGH (ref 70–99)
Glucose-Capillary: 101 mg/dL — ABNORMAL HIGH (ref 70–99)
Glucose-Capillary: 56 mg/dL — ABNORMAL LOW (ref 70–99)
Glucose-Capillary: 74 mg/dL (ref 70–99)
Glucose-Capillary: 76 mg/dL (ref 70–99)
Glucose-Capillary: 87 mg/dL (ref 70–99)
Glucose-Capillary: 94 mg/dL (ref 70–99)

## 2024-06-09 LAB — BASIC METABOLIC PANEL WITH GFR
Anion gap: 5 (ref 5–15)
BUN: 5 mg/dL — ABNORMAL LOW (ref 8–23)
CO2: 30 mmol/L (ref 22–32)
Calcium: 7.2 mg/dL — ABNORMAL LOW (ref 8.9–10.3)
Chloride: 111 mmol/L (ref 98–111)
Creatinine, Ser: <0.3 mg/dL — ABNORMAL LOW (ref 0.61–1.24)
Glucose, Bld: 68 mg/dL — ABNORMAL LOW (ref 70–99)
Potassium: 3.4 mmol/L — ABNORMAL LOW (ref 3.5–5.1)
Sodium: 145 mmol/L (ref 135–145)

## 2024-06-09 LAB — PHOSPHORUS: Phosphorus: 1.8 mg/dL — ABNORMAL LOW (ref 2.5–4.6)

## 2024-06-09 LAB — MAGNESIUM: Magnesium: 1.8 mg/dL (ref 1.7–2.4)

## 2024-06-09 MED ORDER — POTASSIUM PHOSPHATES 15 MMOLE/5ML IV SOLN
30.0000 mmol | Freq: Once | INTRAVENOUS | Status: AC
  Administered 2024-06-09: 30 mmol via INTRAVENOUS
  Filled 2024-06-09: qty 10
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
## 2024-06-09 NOTE — Plan of Care (Signed)
" °  Problem: Education: Goal: Knowledge of General Education information will improve Description: Including pain rating scale, medication(s)/side effects and non-pharmacologic comfort measures Outcome: Progressing   Problem: Health Behavior/Discharge Planning: Goal: Ability to manage health-related needs will improve Outcome: Progressing   Problem: Clinical Measurements: Goal: Ability to maintain clinical measurements within normal limits will improve Outcome: Progressing Goal: Will remain free from infection Outcome: Progressing Goal: Diagnostic test results will improve Outcome: Progressing Goal: Respiratory complications will improve Outcome: Progressing Goal: Cardiovascular complication will be avoided Outcome: Progressing   Problem: Activity: Goal: Risk for activity intolerance will decrease Outcome: Progressing   Problem: Nutrition: Goal: Adequate nutrition will be maintained Outcome: Progressing   Problem: Coping: Goal: Level of anxiety will decrease Outcome: Progressing   Problem: Elimination: Goal: Will not experience complications related to bowel motility Outcome: Progressing Goal: Will not experience complications related to urinary retention Outcome: Progressing   Problem: Pain Managment: Goal: General experience of comfort will improve and/or be controlled Outcome: Progressing   Problem: Safety: Goal: Ability to remain free from injury will improve Outcome: Progressing   Problem: Skin Integrity: Goal: Risk for impaired skin integrity will decrease Outcome: Progressing   Problem: Fluid Volume: Goal: Hemodynamic stability will improve Outcome: Progressing   Problem: Clinical Measurements: Goal: Diagnostic test results will improve Outcome: Progressing Goal: Signs and symptoms of infection will decrease Outcome: Progressing   Problem: Respiratory: Goal: Ability to maintain adequate ventilation will improve Outcome: Progressing   Problem:  Education: Goal: Understanding of discharge needs will improve Outcome: Progressing   Problem: Coping: Goal: Coping ability will improve Outcome: Progressing   Problem: Fluid Volume: Goal: Ability to achieve a balanced intake and output will improve Outcome: Progressing   Problem: Health Behavior/Discharge Planning: Goal: Ability to manage health-related needs will improve Outcome: Progressing   Problem: Nutrition: Goal: Will attain and maintain optimal nutritional status will improve Outcome: Progressing   Problem: Skin Integrity: Goal: Will show signs of wound healing Outcome: Progressing Goal: Risk for impaired skin integrity will decrease Outcome: Progressing   "

## 2024-06-09 NOTE — Progress Notes (Signed)
 Staples removed from right AKA per order, total of 42 staples removed

## 2024-06-10 DIAGNOSIS — J101 Influenza due to other identified influenza virus with other respiratory manifestations: Secondary | ICD-10-CM | POA: Diagnosis not present

## 2024-06-10 LAB — GLUCOSE, CAPILLARY
Glucose-Capillary: 103 mg/dL — ABNORMAL HIGH (ref 70–99)
Glucose-Capillary: 52 mg/dL — ABNORMAL LOW (ref 70–99)
Glucose-Capillary: 67 mg/dL — ABNORMAL LOW (ref 70–99)
Glucose-Capillary: 68 mg/dL — ABNORMAL LOW (ref 70–99)
Glucose-Capillary: 80 mg/dL (ref 70–99)
Glucose-Capillary: 85 mg/dL (ref 70–99)
Glucose-Capillary: 93 mg/dL (ref 70–99)
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
## 2024-06-10 NOTE — Progress Notes (Signed)
 This nurse went in to give patient his medications and do assessment as well as nurse tech checking vitals and sugar. Patient very irritable and annoyed that we keep bothering him. Seems to not really want to eat or drink to keep his sugar up and also seems to not want to take his medications however did agree after this nurse stated that he would be done with the meds for tonight once he took them. Always wants to put things off until later. Will continue to monitor patient

## 2024-06-11 LAB — BASIC METABOLIC PANEL WITH GFR
Anion gap: 5 (ref 5–15)
BUN: 7 mg/dL — ABNORMAL LOW (ref 8–23)
CO2: 31 mmol/L (ref 22–32)
Calcium: 7.6 mg/dL — ABNORMAL LOW (ref 8.9–10.3)
Chloride: 108 mmol/L (ref 98–111)
Creatinine, Ser: <0.3 mg/dL — ABNORMAL LOW (ref 0.61–1.24)
Glucose, Bld: 56 mg/dL — ABNORMAL LOW (ref 70–99)
Potassium: 3.7 mmol/L (ref 3.5–5.1)
Sodium: 143 mmol/L (ref 135–145)

## 2024-06-11 LAB — GLUCOSE, CAPILLARY
Glucose-Capillary: 91 mg/dL (ref 70–99)
Glucose-Capillary: 60 mg/dL — ABNORMAL LOW (ref 70–99)
Glucose-Capillary: 75 mg/dL (ref 70–99)
Glucose-Capillary: 55 mg/dL — ABNORMAL LOW (ref 70–99)
Glucose-Capillary: 63 mg/dL — ABNORMAL LOW (ref 70–99)
Glucose-Capillary: 75 mg/dL (ref 70–99)
Glucose-Capillary: 136 mg/dL — ABNORMAL HIGH (ref 70–99)
Glucose-Capillary: 136 mg/dL — ABNORMAL HIGH (ref 70–99)
Glucose-Capillary: 75 mg/dL (ref 70–99)
Glucose-Capillary: 94 mg/dL (ref 70–99)

## 2024-06-11 LAB — PHOSPHORUS: Phosphorus: 2.5 mg/dL (ref 2.5–4.6)
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
## 2024-06-11 NOTE — TOC Progression Note (Addendum)
 Transition of Care [facility]) - Progression Note    Patient Details  Name: Bradley [name] MRN: [id number] Date of Birth: [birth year]  [facility] CM/SW Contact  [name], RN Phone Number: 06/11/2024, 10:04 AM  Clinical Narrative:     Per Tammy with [facility] they can offer pending [employer] approval  Reviewed with patient.  He states that he would like to discuss with someone before making a final decision   Update:  met with patient at bedside and presented bed offers.  He accepts offer from [facility].  Accepted in HUB and notified Tammy with [facility].  Requested Rojelio with [facility] to start auth                 Expected Discharge Plan and Services                                               Social Drivers of Health (SDOH) Interventions SDOH Screenings   Food Insecurity: No Food Insecurity (05/29/2024)  Housing: Low Risk (05/29/2024)  Transportation Needs: No Transportation Needs (05/29/2024)  Utilities: Not At Risk (05/29/2024)  Depression (PHQ2-9): Low Risk (03/02/2023)  Financial Resource Strain: Low Risk (02/13/2024)   Received from [facility]  Social Connections: Unknown (06/02/2024)  Tobacco Use: Medium Risk (05/28/2024)    Readmission Risk Interventions    03/27/2024    3:44 PM  Readmission Risk Prevention Plan  Transportation Screening Complete  PCP or Specialist Appt within 3-5 Days Complete  HRI or Home Care Consult Complete  Social Work Consult for Recovery Care Planning/Counseling Complete  Palliative Care Screening Not Applicable  Medication Review Oceanographer) Complete

## 2024-06-12 DIAGNOSIS — J101 Influenza due to other identified influenza virus with other respiratory manifestations: Secondary | ICD-10-CM | POA: Diagnosis not present

## 2024-06-12 LAB — GLUCOSE, CAPILLARY
Glucose-Capillary: 105 mg/dL — ABNORMAL HIGH (ref 70–99)
Glucose-Capillary: 105 mg/dL — ABNORMAL HIGH (ref 70–99)
Glucose-Capillary: 61 mg/dL — ABNORMAL LOW (ref 70–99)
Glucose-Capillary: 66 mg/dL — ABNORMAL LOW (ref 70–99)
Glucose-Capillary: 70 mg/dL (ref 70–99)
Glucose-Capillary: 78 mg/dL (ref 70–99)
Glucose-Capillary: 82 mg/dL (ref 70–99)
Glucose-Capillary: 92 mg/dL (ref 70–99)

## 2024-06-12 MED ORDER — MIDODRINE HCL 5 MG PO TABS
5.0000 mg | ORAL_TABLET | Freq: Three times a day (TID) | ORAL | Status: DC
  Administered 2024-06-12 – 2024-06-13 (×3): 5 mg via ORAL
  Filled 2024-06-12 (×3): qty 1
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
## 2024-06-12 NOTE — TOC Progression Note (Addendum)
 Transition of Care [facility]) - Progression Note    Patient Details  Name: Bradley [name] MRN: [id number] Date of Birth: 1946/02/28  [facility]) CM/SW Contact  [name], RN Phone Number: 06/12/2024, 9:19 AM  Clinical Narrative:      [name] pending for [facility]      1208 auth still pending    302 pm - auth still pending          Expected Discharge Plan and Services                                               Social Drivers of Health (SDOH) Interventions SDOH Screenings   Food Insecurity: No Food Insecurity (05/29/2024)  Housing: Low Risk (05/29/2024)  Transportation Needs: No Transportation Needs (05/29/2024)  Utilities: Not At Risk (05/29/2024)  Depression (PHQ2-9): Low Risk (03/02/2023)  Financial Resource Strain: Low Risk (02/13/2024)   Received from [facility]  Social Connections: Unknown (06/02/2024)  Tobacco Use: Medium Risk (05/28/2024)    Readmission Risk Interventions    03/27/2024    3:44 PM  Readmission Risk Prevention Plan  Transportation Screening Complete  PCP or Specialist Appt within 3-5 Days Complete  HRI or Home Care Consult Complete  Social Work Consult for Recovery Care Planning/Counseling Complete  Palliative Care Screening Not Applicable  Medication Review Oceanographer) Complete
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
## 2024-06-13 DIAGNOSIS — J101 Influenza due to other identified influenza virus with other respiratory manifestations: Secondary | ICD-10-CM | POA: Diagnosis not present

## 2024-06-13 LAB — GLUCOSE, CAPILLARY
Glucose-Capillary: 69 mg/dL — ABNORMAL LOW (ref 70–99)
Glucose-Capillary: 88 mg/dL (ref 70–99)
Glucose-Capillary: 69 mg/dL — ABNORMAL LOW (ref 70–99)
Glucose-Capillary: 128 mg/dL — ABNORMAL HIGH (ref 70–99)

## 2024-06-13 LAB — URINE CULTURE: Culture: >=100000 — AB

## 2024-06-13 MED ORDER — MIDODRINE HCL 5 MG PO TABS: 5.0000 mg | ORAL_TABLET | Freq: Two times a day (BID) | ORAL | Status: DC

## 2024-06-13 MED ORDER — COPPER 2 MG PO TABS: 2.0000 mg | Freq: Every day | Status: AC

## 2024-06-13 MED ORDER — DEXTROSE 50 % IV SOLN
12.5000 g | INTRAVENOUS | Status: AC
  Administered 2024-06-13: 12.5 g via INTRAVENOUS
  Filled 2024-06-13: qty 50

## 2024-06-13 MED ORDER — ACETAMINOPHEN 325 MG PO TABS: 650.0000 mg | ORAL_TABLET | Freq: Four times a day (QID) | ORAL | Status: AC | PRN

## 2024-06-13 MED ORDER — VITAMIN D (ERGOCALCIFEROL) 1.25 MG (50000 UNIT) PO CAPS: 50000.0000 [IU] | ORAL_CAPSULE | ORAL | Status: AC

## 2024-06-13 MED ORDER — VANCOMYCIN HCL 125 MG PO CAPS: 125.0000 mg | ORAL_CAPSULE | Freq: Four times a day (QID) | ORAL | Status: AC

## 2024-06-13 MED ORDER — VITAMIN E 180 MG (400 UNIT) PO CAPS: 400.0000 [IU] | ORAL_CAPSULE | Freq: Every day | ORAL | Status: AC
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
## 2024-06-13 NOTE — TOC Progression Note (Signed)
 Transition of Care [facility]) - Progression Note    Patient Details  Name: [name] MRN: [id number] Date of Birth: 02-08-1946  Transition of Care [facility]) CM/SW Contact  [name], RN Phone Number: 06/13/2024, 9:59 AM  Clinical Narrative:        Insurance auth pending to [employer] Additional clinical uploaded to [employer]                  Expected Discharge Plan and Services                                               Social Drivers of Health (SDOH) Interventions SDOH Screenings   Food Insecurity: No Food Insecurity (05/29/2024)  Housing: Low Risk (05/29/2024)  Transportation Needs: No Transportation Needs (05/29/2024)  Utilities: Not At Risk (05/29/2024)  Depression (PHQ2-9): Low Risk (03/02/2023)  Financial Resource Strain: Low Risk (02/13/2024)   Received from [facility]  Social Connections: Unknown (06/02/2024)  Tobacco Use: Medium Risk (05/28/2024)    Readmission Risk Interventions    03/27/2024    3:44 PM  Readmission Risk Prevention Plan  Transportation Screening Complete  PCP or Specialist Appt within 3-5 Days Complete  HRI or Home Care Consult Complete  Social Work Consult for Recovery Care Planning/Counseling Complete  Palliative Care Screening Not Applicable  Medication Review Oceanographer) Complete

## 2024-06-13 NOTE — TOC Transition Note (Signed)
 Transition of Care [facility]) - Discharge Note   Patient Details  Name: Bradley [name] MRN: [id number] Date of Birth: 1945/10/22  [facility]) CM/SW Contact:  [name], RN Phone Number: 06/13/2024, 4:10 PM   Clinical Narrative:     [name] received for [facility]    Patient will DC to: [facility] Anticipated DC date: 06/13/2024  Family notified: son [name] Briar byBETHA Line  Per MD patient ready for DC to . RN, patient, patient's family, and facility notified of DC. Discharge Summary sent to facility. RN given number for report. DC packet on chart. Ambulance transport requested for patient.   TOC signing off.      Patient Goals and CMS Choice            Discharge Placement                       Discharge Plan and Services Additional resources added to the After Visit Summary for                                       Social Drivers of Health (SDOH) Interventions SDOH Screenings   Food Insecurity: No Food Insecurity (05/29/2024)  Housing: Low Risk (05/29/2024)  Transportation Needs: No Transportation Needs (05/29/2024)  Utilities: Not At Risk (05/29/2024)  Depression (PHQ2-9): Low Risk (03/02/2023)  Financial Resource Strain: Low Risk (02/13/2024)   [facility] from [facility]  Social Connections: Unknown (06/02/2024)  Tobacco Use: Medium Risk (05/28/2024)     Readmission Risk Interventions    03/27/2024    3:44 PM  Readmission Risk Prevention Plan  Transportation Screening Complete  PCP or Specialist Appt within 3-5 Days Complete  HRI or Home Care Consult Complete  Social Work Consult for Recovery Care Planning/Counseling Complete  Palliative Care Screening Not Applicable  Medication Review Oceanographer) Complete

## 2024-09-27 ENCOUNTER — Ambulatory Visit: Payer: Medicare HMO | Admitting: Urology

## 2024-10-16 ENCOUNTER — Ambulatory Visit: Admitting: Urology
